# Patient Record
Sex: Female | Born: 1950
Health system: Southern US, Community
[De-identification: ages and names within clinical notes are randomized; demographics above are authoritative.]

## PROBLEM LIST (undated history)

## (undated) ENCOUNTER — Emergency Department (HOSPITAL_COMMUNITY): Admission: EM | Payer: Medicare HMO | Source: Home / Self Care

## (undated) ENCOUNTER — Emergency Department (HOSPITAL_COMMUNITY): Payer: BC Managed Care – PPO | Source: Home / Self Care

## (undated) DIAGNOSIS — M109 Gout, unspecified: Secondary | ICD-10-CM

## (undated) DIAGNOSIS — B1920 Unspecified viral hepatitis C without hepatic coma: Secondary | ICD-10-CM

## (undated) DIAGNOSIS — I499 Cardiac arrhythmia, unspecified: Secondary | ICD-10-CM

## (undated) DIAGNOSIS — I10 Essential (primary) hypertension: Secondary | ICD-10-CM

## (undated) DIAGNOSIS — N189 Chronic kidney disease, unspecified: Secondary | ICD-10-CM

## (undated) DIAGNOSIS — N186 End stage renal disease: Secondary | ICD-10-CM

## (undated) DIAGNOSIS — R7881 Bacteremia: Secondary | ICD-10-CM

## (undated) DIAGNOSIS — J45909 Unspecified asthma, uncomplicated: Secondary | ICD-10-CM

## (undated) DIAGNOSIS — I4510 Unspecified right bundle-branch block: Secondary | ICD-10-CM

## (undated) DIAGNOSIS — D649 Anemia, unspecified: Secondary | ICD-10-CM

## (undated) DIAGNOSIS — I480 Paroxysmal atrial fibrillation: Secondary | ICD-10-CM

## (undated) HISTORY — DX: Chronic kidney disease, unspecified: N18.9

## (undated) NOTE — *Deleted (*Deleted)
[facility] CONSULT NOTE  Patient Care Team: [name], MD as PCP - General (Internal Medicine)  CHIEF COMPLAINTS/PURPOSE OF CONSULTATION:  Newly diagnosed anemia  HISTORY OF PRESENTING ILLNESS:  Diamond [name] 38 y.o. female is here because of recent diagnosis of anemia. She is referred by Dr. Marylen Ponto. Labs on 08/29/20 showed: Hg 9.2, HCT 27.7, platelets 124, iron saturation 37%, TIBC 239, ferritin 123XX123, 0000000 123XX123, folic acid 123456. She presents to the clinic today for initial evaluation.   I reviewed her records extensively and collaborated the history with the patient.  MEDICAL HISTORY:  Past Medical History:  Diagnosis Date  . Asthma   . Chronic kidney disease   . Gout   . Hypertension     SURGICAL HISTORY: Past Surgical History:  Procedure Laterality Date  . CESAREAN SECTION  1989    SOCIAL HISTORY: Social History   Socioeconomic History  . Marital status: Widowed    Spouse name: Not on file  . Number of children: Not on file  . Years of education: Not on file  . Highest education level: Not on file  Occupational History  . Not on file  Tobacco Use  . Smoking status: Never Smoker  . Smokeless tobacco: Never Used  Vaping Use  . Vaping Use: Never used  Substance and Sexual Activity  . Alcohol use: No  . Drug use: No  . Sexual activity: Not on file  Other Topics Concern  . Not on file  Social History Narrative  . Not on file   Social Determinants of Health   Financial Resource Strain:   . Difficulty of Paying Living Expenses: Not on file  Food Insecurity:   . Worried About Charity fundraiser in the Last Year: Not on file  . Ran Out of Food in the Last Year: Not on file  Transportation Needs:   . Lack of Transportation (Medical): Not on file  . Lack of Transportation (Non-Medical): Not on file  Physical Activity:   . Days of Exercise per Week: Not on file  . Minutes of Exercise per Session: Not on file  Stress:   . Feeling  of Stress : Not on file  Social Connections:   . Frequency of Communication with Friends and Family: Not on file  . Frequency of Social Gatherings with Friends and Family: Not on file  . Attends Religious Services: Not on file  . Active Member of Clubs or Organizations: Not on file  . Attends Archivist Meetings: Not on file  . Marital Status: Not on file  Intimate Partner Violence:   . Fear of Current or Ex-Partner: Not on file  . Emotionally Abused: Not on file  . Physically Abused: Not on file  . Sexually Abused: Not on file    FAMILY HISTORY: Family History  Problem Relation Age of Onset  . Hypertension Mother   . Hypertension Father   . Colon cancer Brother   . Lung cancer Brother     ALLERGIES:  has No Known Allergies.  MEDICATIONS:  Current Outpatient Medications  Medication Sig Dispense Refill  . albuterol (VENTOLIN HFA) 108 (90 Base) MCG/ACT inhaler Inhale 2 puffs into the lungs every 4 (four) hours as needed for wheezing or shortness of breath.    Marland Kitchen apixaban (ELIQUIS) 2.5 MG TABS tablet Take 1 tablet (2.5 mg total) by mouth 2 (two) times daily. 60 tablet 0  . atenolol (TENORMIN) 25 MG tablet Take 25 mg by mouth daily.    Marland Kitchen  budesonide-formoterol (SYMBICORT) 160-4.5 MCG/ACT inhaler Inhale 2 puffs into the lungs 2 (two) times daily.    . cyclobenzaprine (FLEXERIL) 5 MG tablet Take 1 tablet (5 mg total) by mouth 2 (two) times daily as needed for muscle spasms. 30 tablet 0  . Fluticasone-Salmeterol (ADVAIR DISKUS) 250-50 MCG/DOSE AEPB Inhale 1 puff into the lungs 2 (two) times daily.     . montelukast (SINGULAIR) 10 MG tablet Take 10 mg by mouth at bedtime.    . traMADol (ULTRAM) 50 MG tablet Take 1-2 tablets (50-100 mg total) by mouth every 12 (twelve) hours as needed for up to 10 doses for moderate pain or severe pain. 12 tablet 0  . verapamil (CALAN) 120 MG tablet Take 120 mg by mouth 3 (three) times daily.     No current facility-administered medications for  this visit.    REVIEW OF SYSTEMS:   Constitutional: Denies fevers, chills or abnormal night sweats Eyes: Denies blurriness of vision, double vision or watery eyes Ears, nose, mouth, throat, and face: Denies mucositis or sore throat Respiratory: Denies cough, dyspnea or wheezes Cardiovascular: Denies palpitation, chest discomfort or lower extremity swelling Gastrointestinal:  Denies nausea, heartburn or change in bowel habits Skin: Denies abnormal skin rashes Lymphatics: Denies new lymphadenopathy or easy bruising Neurological:Denies numbness, tingling or new weaknesses Behavioral/Psych: Mood is stable, no new changes  Breast: Denies any palpable lumps or discharge All other systems were reviewed with the patient and are negative.  PHYSICAL EXAMINATION: ECOG PERFORMANCE STATUS: {CHL ONC ECOG [phone]}  There were no vitals filed for this visit. There were no vitals filed for this visit.  GENERAL:alert, no distress and comfortable SKIN: skin color, texture, turgor are normal, no rashes or significant lesions EYES: normal, conjunctiva are pink and non-injected, sclera clear OROPHARYNX:no exudate, no erythema and lips, buccal mucosa, and tongue normal  NECK: supple, thyroid normal size, non-tender, without nodularity LYMPH:  no palpable lymphadenopathy in the cervical, axillary or inguinal LUNGS: clear to auscultation and percussion with normal breathing effort HEART: regular rate & rhythm and no murmurs and no lower extremity edema ABDOMEN:abdomen soft, non-tender and normal bowel sounds Musculoskeletal:no cyanosis of digits and no clubbing  PSYCH: alert & oriented x 3 with fluent speech NEURO: no focal motor/sensory deficits  LABORATORY DATA:  I have reviewed the data as listed Lab Results  Component Value Date   WBC 6.2 11/12/2019   HGB 8.9 (L) 11/12/2019   HCT 28.7 (L) 11/12/2019   MCV 102.1 (H) 11/12/2019   PLT 364 11/12/2019   Lab Results  Component Value Date    NA 140 11/12/2019   K 4.4 11/12/2019   CL 108 11/12/2019   CO2 22 11/12/2019    RADIOGRAPHIC STUDIES: I have personally reviewed the radiological reports and agreed with the findings in the report.  ASSESSMENT AND PLAN:  No problem-specific Assessment & Plan notes found for this encounter.   All questions were answered. The patient knows to call the clinic with any problems, questions or concerns.   Rulon Eisenmenger, MD, MPH 09/22/2020    I, Molly Dorshimer, am acting as scribe for Nicholas Lose, MD.  {Add scribe attestation statement}

---

## 2001-07-10 ENCOUNTER — Encounter: Payer: Self-pay | Admitting: Emergency Medicine

## 2001-07-10 ENCOUNTER — Encounter: Admission: RE | Admit: 2001-07-10 | Discharge: 2001-07-10 | Payer: Self-pay | Admitting: Emergency Medicine

## 2001-10-06 ENCOUNTER — Emergency Department (HOSPITAL_COMMUNITY): Admission: EM | Admit: 2001-10-06 | Discharge: 2001-10-06 | Payer: Self-pay | Admitting: Emergency Medicine

## 2003-08-27 ENCOUNTER — Emergency Department (HOSPITAL_COMMUNITY): Admission: EM | Admit: 2003-08-27 | Discharge: 2003-08-27 | Payer: Self-pay | Admitting: Emergency Medicine

## 2006-03-06 ENCOUNTER — Emergency Department (HOSPITAL_COMMUNITY): Admission: EM | Admit: 2006-03-06 | Discharge: 2006-03-06 | Payer: Self-pay | Admitting: Family Medicine

## 2008-02-25 ENCOUNTER — Emergency Department (HOSPITAL_COMMUNITY): Admission: EM | Admit: 2008-02-25 | Discharge: 2008-02-25 | Payer: Self-pay | Admitting: Family Medicine

## 2010-10-15 ENCOUNTER — Observation Stay (HOSPITAL_COMMUNITY): Admission: EM | Admit: 2010-10-15 | Discharge: 2010-10-15 | Payer: Self-pay | Admitting: Emergency Medicine

## 2010-10-22 ENCOUNTER — Emergency Department (HOSPITAL_COMMUNITY): Admission: EM | Admit: 2010-10-22 | Discharge: 2010-10-22 | Payer: Self-pay | Admitting: Family Medicine

## 2011-02-16 LAB — BASIC METABOLIC PANEL
BUN: 15 mg/dL (ref 6–23)
BUN: 16 mg/dL (ref 6–23)
CO2: 29 mEq/L (ref 19–32)
Calcium: 10 mg/dL (ref 8.4–10.5)
Calcium: 10 mg/dL (ref 8.4–10.5)
Chloride: 100 mEq/L (ref 96–112)
Creatinine, Ser: 0.9 mg/dL (ref 0.4–1.2)
Creatinine, Ser: 1.21 mg/dL — ABNORMAL HIGH (ref 0.4–1.2)
GFR calc Af Amer: >60 mL/min (ref >60)
GFR calc non Af Amer: 45 mL/min — ABNORMAL LOW (ref >60)
GFR calc non Af Amer: >60 mL/min (ref >60)
Glucose, Bld: 122 mg/dL — ABNORMAL HIGH (ref 70–99)
Glucose, Bld: 162 mg/dL — ABNORMAL HIGH (ref 70–99)
Potassium: 2.6 mEq/L — CL (ref 3.5–5.1)
Potassium: 2.9 mEq/L — ABNORMAL LOW (ref 3.5–5.1)
Sodium: 143 mEq/L (ref 135–145)

## 2011-02-16 LAB — POCT I-STAT 3, ART BLOOD GAS (G3+)
Acid-base deficit: 1 mmol/L (ref 0.0–2.0)
Bicarbonate: 24.8 mEq/L — ABNORMAL HIGH (ref 20.0–24.0)
TCO2: 26 mmol/L (ref 0–100)
pH, Arterial: 7.347 — ABNORMAL LOW (ref 7.350–7.400)

## 2011-02-16 LAB — CBC
HCT: 37.5 % (ref 36.0–46.0)
MCH: 33.2 pg (ref 26.0–34.0)
MCV: 96.4 fL (ref 78.0–100.0)
Platelets: 118 10*3/uL — ABNORMAL LOW (ref 150–400)
RBC: 3.89 MIL/uL (ref 3.87–5.11)
RDW: 13.5 % (ref 11.5–15.5)

## 2011-02-16 LAB — DIFFERENTIAL
Eosinophils Absolute: 0.2 10*3/uL (ref 0.0–0.7)
Eosinophils Relative: 3 % (ref 0–5)
Lymphs Abs: 2.6 10*3/uL (ref 0.7–4.0)
Monocytes Absolute: 0.4 10*3/uL (ref 0.1–1.0)

## 2011-02-16 LAB — POCT I-STAT, CHEM 8
BUN: 18 mg/dL (ref 6–23)
Calcium, Ion: 1.08 mmol/L — ABNORMAL LOW (ref 1.12–1.32)
Calcium, Ion: 1.14 mmol/L (ref 1.12–1.32)
Creatinine, Ser: 1.1 mg/dL (ref 0.4–1.2)
Glucose, Bld: 297 mg/dL — ABNORMAL HIGH (ref 70–99)
HCT: 45 % (ref 36.0–46.0)
HCT: 47 % — ABNORMAL HIGH (ref 36.0–46.0)
Hemoglobin: 15.3 g/dL — ABNORMAL HIGH (ref 12.0–15.0)
Hemoglobin: 16 g/dL — ABNORMAL HIGH (ref 12.0–15.0)
Potassium: 2.7 mEq/L — CL (ref 3.5–5.1)
Sodium: 136 mEq/L (ref 135–145)
TCO2: 25 mmol/L (ref 0–100)
TCO2: 30 mmol/L (ref 0–100)

## 2011-02-22 ENCOUNTER — Other Ambulatory Visit (HOSPITAL_COMMUNITY): Payer: Self-pay | Admitting: Family Medicine

## 2011-02-22 DIAGNOSIS — Z1231 Encounter for screening mammogram for malignant neoplasm of breast: Secondary | ICD-10-CM

## 2011-02-23 ENCOUNTER — Encounter (INDEPENDENT_AMBULATORY_CARE_PROVIDER_SITE_OTHER): Payer: Self-pay | Admitting: *Deleted

## 2011-02-23 LAB — CONVERTED CEMR LAB
AST: 49 units/L — ABNORMAL HIGH (ref 0–37)
Albumin: 3.9 g/dL (ref 3.5–5.2)
Alkaline Phosphatase: 53 units/L (ref 39–117)
Basophils Absolute: 0 10*3/uL (ref 0.0–0.1)
Basophils Relative: 0 % (ref 0–1)
Eosinophils Absolute: 0.1 10*3/uL (ref 0.0–0.7)
Glucose, Bld: 90 mg/dL (ref 70–99)
Hemoglobin: 12.6 g/dL (ref 12.0–15.0)
LDL Cholesterol: 80 mg/dL (ref 0–99)
MCHC: 33.2 g/dL (ref 30.0–36.0)
MCV: 96 fL (ref 78.0–100.0)
Monocytes Absolute: 0.4 10*3/uL (ref 0.1–1.0)
Monocytes Relative: 10 % (ref 3–12)
Neutrophils Relative %: 41 % — ABNORMAL LOW (ref 43–77)
Potassium: 3.6 meq/L (ref 3.5–5.3)
RBC: 3.96 M/uL (ref 3.87–5.11)
RDW: 14.2 % (ref 11.5–15.5)
Sodium: 142 meq/L (ref 135–145)
TSH: 3.177 microintl units/mL (ref 0.350–4.500)
Total Bilirubin: 0.7 mg/dL (ref 0.3–1.2)
Total Protein: 7.4 g/dL (ref 6.0–8.3)

## 2011-03-02 ENCOUNTER — Ambulatory Visit (HOSPITAL_COMMUNITY)
Admission: RE | Admit: 2011-03-02 | Discharge: 2011-03-02 | Disposition: A | Discharge status: Home / Self Care | Payer: Self-pay | Source: Ambulatory Visit | Attending: Family Medicine | Admitting: Family Medicine

## 2011-03-02 DIAGNOSIS — Z1231 Encounter for screening mammogram for malignant neoplasm of breast: Secondary | ICD-10-CM | POA: Insufficient documentation

## 2011-03-05 ENCOUNTER — Other Ambulatory Visit: Payer: Self-pay | Admitting: Family Medicine

## 2011-03-05 DIAGNOSIS — R928 Other abnormal and inconclusive findings on diagnostic imaging of breast: Secondary | ICD-10-CM

## 2011-03-10 ENCOUNTER — Ambulatory Visit
Admission: RE | Admit: 2011-03-10 | Discharge: 2011-03-10 | Disposition: A | Discharge status: Home / Self Care | Payer: Self-pay | Source: Ambulatory Visit | Attending: Family Medicine | Admitting: Family Medicine

## 2011-03-10 DIAGNOSIS — R928 Other abnormal and inconclusive findings on diagnostic imaging of breast: Secondary | ICD-10-CM

## 2011-08-29 ENCOUNTER — Emergency Department (HOSPITAL_COMMUNITY)
Admission: EM | Admit: 2011-08-29 | Discharge: 2011-08-29 | Disposition: A | Discharge status: Home / Self Care | Payer: Self-pay | Attending: Emergency Medicine | Admitting: Emergency Medicine

## 2011-08-29 DIAGNOSIS — M79609 Pain in unspecified limb: Secondary | ICD-10-CM | POA: Insufficient documentation

## 2011-08-29 DIAGNOSIS — I10 Essential (primary) hypertension: Secondary | ICD-10-CM | POA: Insufficient documentation

## 2011-08-29 DIAGNOSIS — Z79899 Other long term (current) drug therapy: Secondary | ICD-10-CM | POA: Insufficient documentation

## 2011-08-29 DIAGNOSIS — M722 Plantar fascial fibromatosis: Secondary | ICD-10-CM | POA: Insufficient documentation

## 2013-02-17 ENCOUNTER — Encounter (HOSPITAL_COMMUNITY): Payer: Self-pay | Admitting: Emergency Medicine

## 2013-02-17 ENCOUNTER — Emergency Department (HOSPITAL_COMMUNITY)
Admission: EM | Admit: 2013-02-17 | Discharge: 2013-02-17 | Disposition: A | Discharge status: Home / Self Care | Payer: BC Managed Care – PPO | Source: Home / Self Care | Attending: Emergency Medicine | Admitting: Emergency Medicine

## 2013-02-17 DIAGNOSIS — I10 Essential (primary) hypertension: Secondary | ICD-10-CM

## 2013-02-17 HISTORY — DX: Unspecified asthma, uncomplicated: J45.909

## 2013-02-17 HISTORY — DX: Essential (primary) hypertension: I10

## 2013-02-17 MED ORDER — VERAPAMIL HCL 120 MG PO TABS: 120.0000 mg | ORAL_TABLET | Freq: Three times a day (TID) | ORAL | Status: DC

## 2013-02-17 NOTE — ED Provider Notes (Signed)
Medical screening examination/treatment/procedure(s) were performed by non-physician practitioner and as supervising physician I was immediately available for consultation/collaboration.  Philipp Deputy, M.D.  Harden Mo, MD 02/17/13 2031

## 2013-02-17 NOTE — ED Notes (Signed)
Pt is here to have her BP medication refilled Went to a walk in clinic but they were not taking any more pt's  Denies any medical problems: SOB, chest pain, blurry vision, headache, edema  She is alert and oriented w/no signs of acute disterss.

## 2013-02-17 NOTE — ED Provider Notes (Signed)
History     CSN: [id number]  Arrival date & time 02/17/13  1103   First MD Initiated Contact with Patient 02/17/13 1115      Chief Complaint  Patient presents with  . Medication Refill    HPI: The history is provided by the patient.  Pt presents requesting refill for her Verapamil. States she is a pt at [facility] on [address]. Dr Jimmye Norman who usually sees her for her physicals and med refills has been unable to work her in this week. She has presented x 2 to his clinic this week and was told she had come too late. She was going to run out after todays dose. Pt does plan to call and schedule a visit with Dr Jimmye Norman this week. Denies any c/o's.   Past Medical History  Diagnosis Date  . Hypertension   . Asthma     History reviewed. No pertinent past surgical history.  No family history on file.  History  Substance Use Topics  . Smoking status: Never Smoker   . Smokeless tobacco: Not on file  . Alcohol Use: No    OB History   Grav Para Term Preterm Abortions TAB SAB Ect Mult Living                  Review of Systems  Constitutional: Negative.   HENT: Negative.   Eyes: Negative.   Respiratory: Negative for chest tightness and shortness of breath.   Cardiovascular: Negative for chest pain and palpitations.  Gastrointestinal: Negative for nausea, vomiting, abdominal pain and diarrhea.  Endocrine: Negative.   Genitourinary: Negative.   Musculoskeletal: Negative.   Skin: Negative.   Allergic/Immunologic: Negative.   Neurological: Negative.   Hematological: Negative.   Psychiatric/Behavioral: Negative.     Allergies  Review of patient's allergies indicates no known allergies.  Home Medications   Current Outpatient Rx  Name  Route  Sig  Dispense  Refill  . atenolol (TENORMIN) 25 MG tablet   Oral   Take 25 mg by mouth daily.         . verapamil (CALAN) 120 MG tablet   Oral   Take 120 mg by mouth 3 (three) times daily.         .  Fluticasone-Salmeterol (ADVAIR DISKUS IN)   Inhalation   Inhale into the lungs.         . hydrochlorothiazide (HYDRODIURIL) 25 MG tablet   Oral   Take 25 mg by mouth daily.           Pulse 65  Temp(Src) 97.9 F (36.6 C) (Oral)  Resp 18  SpO2 96%  Physical Exam  Nursing note and vitals reviewed. Constitutional: She is oriented to person, place, and time. She appears well-developed and well-nourished.  HENT:  Head: Normocephalic and atraumatic.  Eyes: Conjunctivae are normal.  Neck: Neck supple.  Cardiovascular: Normal rate and regular rhythm.   Pulmonary/Chest: Effort normal and breath sounds normal.  Musculoskeletal: Normal range of motion.  Neurological: She is alert and oriented to person, place, and time.  Skin: Skin is warm and dry.  Psychiatric: She has a normal mood and affect.    ED Course  Procedures  Labs Reviewed - No data to display No results found.   No diagnosis found.    MDM  Request refill for Verapamil. No c/o's. Has attempted x 2 this week to get in to see PCP w/o success. BP today 143/93. Denies c/o. Agreed to refill Vrapamil for  1 month to allow time for pt to arrange f/u w/ PCP as planned.         Jeryl Columbia, NP 02/17/13 [place], NP 02/17/13 1159

## 2015-09-06 ENCOUNTER — Emergency Department (INDEPENDENT_AMBULATORY_CARE_PROVIDER_SITE_OTHER): Payer: Medicare HMO

## 2015-09-06 ENCOUNTER — Encounter (HOSPITAL_COMMUNITY): Payer: Self-pay | Admitting: *Deleted

## 2015-09-06 ENCOUNTER — Emergency Department (HOSPITAL_COMMUNITY)
Admission: EM | Admit: 2015-09-06 | Discharge: 2015-09-06 | Disposition: A | Discharge status: Home / Self Care | Payer: Medicare HMO | Source: Home / Self Care | Attending: Family Medicine | Admitting: Family Medicine

## 2015-09-06 DIAGNOSIS — M10071 Idiopathic gout, right ankle and foot: Secondary | ICD-10-CM

## 2015-09-06 MED ORDER — COLCHICINE 0.6 MG PO TABS: 0.6000 mg | ORAL_TABLET | Freq: Two times a day (BID) | ORAL | Status: DC

## 2015-09-06 NOTE — ED Notes (Signed)
Pt  Reports  Pain  r  Foot    -    Pt  denys  Any  inujury         Pt  States    Symptoms  X  2  Weeks      Ago       -  Pt  denys  Any specefic  Injury

## 2015-09-06 NOTE — Discharge Instructions (Signed)
Use medicine as prescribed until pain resolves, see your doctor for further eval if further problems and for refill of medicine if needed.

## 2015-09-06 NOTE — ED Provider Notes (Signed)
CSN: [id number]     Arrival date & time 09/06/15  1316 History   None    Chief Complaint  Patient presents with  . Foot Pain   (Consider location/radiation/quality/duration/timing/severity/associated sxs/prior Treatment) Patient is a 64 y.o. female presenting with lower extremity pain. The history is provided by the patient.  Foot Pain This is a new problem. The current episode started more than 1 week ago (2wks ago stubbed toe, improving pain adn sts, poss gout.). The problem has been gradually improving.    Past Medical History  Diagnosis Date  . Hypertension   . Asthma    History reviewed. No pertinent past surgical history. History reviewed. No pertinent family history. Social History  Substance Use Topics  . Smoking status: Never Smoker   . Smokeless tobacco: None  . Alcohol Use: No   OB History    No data available     Review of Systems  Musculoskeletal: Positive for joint swelling and gait problem. Negative for myalgias.  Skin: Negative.   All other systems reviewed and are negative.   Allergies  Review of patient's allergies indicates no known allergies.  Home Medications   Prior to Admission medications   Medication Sig Start Date End Date Taking? Authorizing Provider  atenolol (TENORMIN) 25 MG tablet Take 25 mg by mouth daily.    Historical Provider, MD  colchicine 0.6 MG tablet Take 1 tablet (0.6 mg total) by mouth 2 (two) times daily. For gout 09/06/15   Billy Fischer, MD  Fluticasone-Salmeterol (ADVAIR DISKUS IN) Inhale into the lungs.    Historical Provider, MD  hydrochlorothiazide (HYDRODIURIL) 25 MG tablet Take 25 mg by mouth daily.    Historical Provider, MD  verapamil (CALAN) 120 MG tablet Take 120 mg by mouth 3 (three) times daily.    Historical Provider, MD  verapamil (CALAN) 120 MG tablet Take 1 tablet (120 mg total) by mouth 3 (three) times daily. 02/17/13   Jeryl Columbia, NP   Meds Ordered and Administered this Visit  Medications - No data  to display  BP 133/89 mmHg  Pulse 73  Temp(Src) 98.8 F (37.1 C) (Oral)  Resp 18  SpO2 98% No data found.   Physical Exam  Constitutional: She is oriented to person, place, and time. She appears well-developed and well-nourished.  Musculoskeletal: She exhibits edema and tenderness.       Feet:  Neurological: She is alert and oriented to person, place, and time.  Skin: Skin is warm and dry. No erythema.  Nursing note and vitals reviewed.   ED Course  Procedures (including critical care time)  Labs Review Labs Reviewed - No data to display  Imaging Review Dg Toe Great Right  09/06/2015   CLINICAL DATA:  Trauma 2 weeks ago, persistent pain and swelling right great toe.  EXAM: RIGHT GREAT TOE  COMPARISON:  None.  FINDINGS: Soft tissue swelling about the right first MTP joint. Degenerative changes of the right first MTP joint with joint space loss, sclerosis and bony spurring. No malalignment. Surrounding periarticular soft tissue calcifications medially suggesting a component of gout. No acute osseous finding or fracture.  IMPRESSION: Soft tissue swelling.  Right first MTP joint degenerative change and periarticular soft tissue swelling with calcification, suggesting gout arthropathy  No acute osseous finding   Electronically Signed   By: Jerilynn Mages.  Shick M.D.   On: 09/06/2015 14:17    X-rays reviewed and report per radiologist.  Visual Acuity Review  Right Eye Distance:   Left  Eye Distance:   Bilateral Distance:    Right Eye Near:   Left Eye Near:    Bilateral Near:         MDM   1. Acute idiopathic gout of right foot    rx for colchicine for presumtive gout.    Billy Fischer, MD 09/06/15 [phone]

## 2016-03-04 ENCOUNTER — Emergency Department (HOSPITAL_COMMUNITY)
Admission: EM | Admit: 2016-03-04 | Discharge: 2016-03-04 | Disposition: A | Discharge status: Home / Self Care | Payer: Medicare HMO | Source: Home / Self Care | Attending: Family Medicine | Admitting: Family Medicine

## 2016-03-04 ENCOUNTER — Encounter (HOSPITAL_COMMUNITY): Payer: Self-pay | Admitting: Emergency Medicine

## 2016-03-04 DIAGNOSIS — M109 Gout, unspecified: Secondary | ICD-10-CM

## 2016-03-04 DIAGNOSIS — M1000 Idiopathic gout, unspecified site: Secondary | ICD-10-CM

## 2016-03-04 DIAGNOSIS — M79672 Pain in left foot: Secondary | ICD-10-CM

## 2016-03-04 HISTORY — DX: Gout, unspecified: M10.9

## 2016-03-04 LAB — POCT I-STAT, CHEM 8
BUN: 30 mg/dL — AB (ref 6–20)
CALCIUM ION: 1.2 mmol/L (ref 1.13–1.30)
CREATININE: 1.5 mg/dL — AB (ref 0.44–1.00)
Chloride: 100 mmol/L — ABNORMAL LOW (ref 101–111)
Glucose, Bld: 110 mg/dL — ABNORMAL HIGH (ref 65–99)
HCT: 38 % (ref 36.0–46.0)
Hemoglobin: 12.9 g/dL (ref 12.0–15.0)
Potassium: 3 mmol/L — ABNORMAL LOW (ref 3.5–5.1)
SODIUM: 141 mmol/L (ref 135–145)
TCO2: 29 mmol/L (ref 0–100)

## 2016-03-04 MED ORDER — KETOROLAC TROMETHAMINE 30 MG/ML IJ SOLN
30.0000 mg | Freq: Once | INTRAMUSCULAR | Status: AC
  Administered 2016-03-04: 30 mg via INTRAMUSCULAR

## 2016-03-04 MED ORDER — INDOMETHACIN 25 MG PO CAPS: 25.0000 mg | ORAL_CAPSULE | Freq: Three times a day (TID) | ORAL | Status: DC

## 2016-03-04 MED ORDER — KETOROLAC TROMETHAMINE 30 MG/ML IJ SOLN
INTRAMUSCULAR | Status: AC
  Filled 2016-03-04: qty 1

## 2016-03-04 NOTE — ED Provider Notes (Addendum)
CSN: [id number]     Arrival date & time 03/04/16  1324 History   First MD Initiated Contact with Patient 03/04/16 1436     Chief Complaint  Patient presents with  . Toe Pain   (Consider location/radiation/quality/duration/timing/severity/associated sxs/prior Treatment) Patient is a 65 y.o. female presenting with toe pain. The history is provided by the patient. No language interpreter was used.  Toe Pain Pertinent negatives include no chest pain and no abdominal pain.   Patient presents with complaint of L great toe pain and swelling, began on Saturday March 25th when she woke up.  Hot and red, painful to walk. No trauma, no change in activity.  Worsened on Sun/Mon.  In the past she had a similar episode of pain/swelling in the R great toe, diagnosed then with gout and given Rx for colchicine 0.6mg . She took some colchicine on this episode of pain, says it did not help but did give her diarrhea.  She has taken Motrin 200mg  tablets, 1 tablet a few times a day without relief.   Is able to ambulate, favors L foot.   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  Past Medical History  Diagnosis Date  . Hypertension   . Asthma   . Gout    History reviewed. No pertinent past surgical history. No family history on file. Social History  Substance Use Topics  . Smoking status: Never Smoker   . Smokeless tobacco: None  . Alcohol Use: No   OB History    No data available     Review of Systems  Constitutional: Negative for fever, chills, diaphoresis, fatigue and unexpected weight change.  Respiratory: Negative for chest tightness and wheezing.   Cardiovascular: Negative for chest pain.  Gastrointestinal: Negative for abdominal pain.  All other systems reviewed and are negative.   Allergies  Review of patient's allergies indicates no known allergies.  Home Medications   Prior to Admission medications   Medication Sig Start  Date End Date Taking? Authorizing Provider  atenolol (TENORMIN) 25 MG tablet Take 25 mg by mouth daily.   Yes Historical Provider, MD  colchicine 0.6 MG tablet Take 1 tablet (0.6 mg total) by mouth 2 (two) times daily. For gout 09/06/15  Yes Billy Fischer, MD  hydrochlorothiazide (HYDRODIURIL) 25 MG tablet Take 25 mg by mouth daily.   Yes Historical Provider, MD  verapamil (CALAN) 120 MG tablet Take 120 mg by mouth 3 (three) times daily.   Yes Historical Provider, MD  Fluticasone-Salmeterol (ADVAIR DISKUS IN) Inhale into the lungs.    Historical Provider, MD  verapamil (CALAN) 120 MG tablet Take 1 tablet (120 mg total) by mouth 3 (three) times daily. 02/17/13   Jeryl Columbia, NP   Meds Ordered and Administered this Visit  Medications - No data to display  BP 124/78 mmHg  Pulse 70  Temp(Src) 98.6 F (37 C) (Oral)  Resp 16  SpO2 99% No data found.   Physical Exam  Constitutional: She appears well-developed and well-nourished. No distress.  Neck: Neck supple.  Musculoskeletal:  Bilateral dp pulses palpable.   L MTP joint red, hot and tender.  Brisk cap refill in distal L great toe (<2 seconds). Sensation grossly intact.  No swelling or tenderness along ankle; full active ROM ankle with dorsi/plantarflexion.   Skin: She is not diaphoretic.    ED Course  Procedures (including critical care time)  Labs Review Labs Reviewed - No  data to display  Imaging Review No results found.   Visual Acuity Review  Right Eye Distance:   Left Eye Distance:   Bilateral Distance:    Right Eye Near:   Left Eye Near:    Bilateral Near:         MDM   1. Podagra   2. Left foot pain    Podagra L great toe.  Patient denies history of renal disease or any drug allergies. Prior iStat from several years ago with mildly impaired renal function. To recheck iStat before considering ketorolac, Cr 1.5, reduced dose Toradol 30mg  IM x 1. Indomethacin at home starting tomorrow. Primary doctor  Dr York Ram on [facility]) for follow up.   Dalbert Mayotte, MD    Willeen Niece, MD 03/04/16 [place], MD 03/04/16 [phone]

## 2016-03-04 NOTE — Discharge Instructions (Signed)
It is a pleasure to see you today.  I believe you have a repeat case of gout.   You are being given an injectable medicine Ketorolac 30mg , intramuscularly, for pain.   Indomethacin 25mg  capsules, take 1 capsule by mouth every 8 hours with something to eat. Start this medicine tomorrow.   Plenty of fluids.  Do not take Motrin (ibuprofen) along with the prescribed medicine.  You may take Tylenol if needed, in addition to the Indomethacin.   Follow up with Dr York Ram at [facility] in the coming week.    Gout Gout is an inflammatory arthritis caused by a buildup of uric acid crystals in the joints. Uric acid is a chemical that is normally present in the blood. When the level of uric acid in the blood is too high it can form crystals that deposit in your joints and tissues. This causes joint redness, soreness, and swelling (inflammation). Repeat attacks are common. Over time, uric acid crystals can form into masses (tophi) near a joint, destroying bone and causing disfigurement. Gout is treatable and often preventable. CAUSES  The disease begins with elevated levels of uric acid in the blood. Uric acid is produced by your body when it breaks down a naturally found substance called purines. Certain foods you eat, such as meats and fish, contain high amounts of purines. Causes of an elevated uric acid level include:  Being passed down from parent to child (heredity).  Diseases that cause increased uric acid production (such as obesity, psoriasis, and certain cancers).  Excessive alcohol use.  Diet, especially diets rich in meat and seafood.  Medicines, including certain cancer-fighting medicines (chemotherapy), water pills (diuretics), and aspirin.  Chronic kidney disease. The kidneys are no longer able to remove uric acid well.  Problems with metabolism. Conditions strongly associated with gout include:  Obesity.  High blood pressure.  High  cholesterol.  Diabetes. Not everyone with elevated uric acid levels gets gout. It is not understood why some people get gout and others do not. Surgery, joint injury, and eating too much of certain foods are some of the factors that can lead to gout attacks. SYMPTOMS   An attack of gout comes on quickly. It causes intense pain with redness, swelling, and warmth in a joint.  Fever can occur.  Often, only one joint is involved. Certain joints are more commonly involved:  Base of the big toe.  Knee.  Ankle.  Wrist.  Finger. Without treatment, an attack usually goes away in a few days to weeks. Between attacks, you usually will not have symptoms, which is different from many other forms of arthritis. DIAGNOSIS  Your caregiver will suspect gout based on your symptoms and exam. In some cases, tests may be recommended. The tests may include:  Blood tests.  Urine tests.  X-rays.  Joint fluid exam. This exam requires a needle to remove fluid from the joint (arthrocentesis). Using a microscope, gout is confirmed when uric acid crystals are seen in the joint fluid. TREATMENT  There are two phases to gout treatment: treating the sudden onset (acute) attack and preventing attacks (prophylaxis).  Treatment of an Acute Attack.  Medicines are used. These include anti-inflammatory medicines or steroid medicines.  An injection of steroid medicine into the affected joint is sometimes necessary.  The painful joint is rested. Movement can worsen the arthritis.  You may use warm or cold treatments on painful joints, depending which works best for you.  Treatment to Prevent Attacks.  If you suffer from frequent gout attacks, your caregiver may advise preventive medicine. These medicines are started after the acute attack subsides. These medicines either help your kidneys eliminate uric acid from your body or decrease your uric acid production. You may need to stay on these medicines for a  very long time.  The early phase of treatment with preventive medicine can be associated with an increase in acute gout attacks. For this reason, during the first few months of treatment, your caregiver may also advise you to take medicines usually used for acute gout treatment. Be sure you understand your caregiver's directions. Your caregiver may make several adjustments to your medicine dose before these medicines are effective.  Discuss dietary treatment with your caregiver or dietitian. Alcohol and drinks high in sugar and fructose and foods such as meat, poultry, and seafood can increase uric acid levels. Your caregiver or dietitian can advise you on drinks and foods that should be limited. HOME CARE INSTRUCTIONS   Do not take aspirin to relieve pain. This raises uric acid levels.  Only take over-the-counter or prescription medicines for pain, discomfort, or fever as directed by your caregiver.  Rest the joint as much as possible. When in bed, keep sheets and blankets off painful areas.  Keep the affected joint raised (elevated).  Apply warm or cold treatments to painful joints. Use of warm or cold treatments depends on which works best for you.  Use crutches if the painful joint is in your leg.  Drink enough fluids to keep your urine clear or pale yellow. This helps your body get rid of uric acid. Limit alcohol, sugary drinks, and fructose drinks.  Follow your dietary instructions. Pay careful attention to the amount of protein you eat. Your daily diet should emphasize fruits, vegetables, whole grains, and fat-free or low-fat milk products. Discuss the use of coffee, vitamin C, and cherries with your caregiver or dietitian. These may be helpful in lowering uric acid levels.  Maintain a healthy body weight. SEEK MEDICAL CARE IF:   You develop diarrhea, vomiting, or any side effects from medicines.  You do not feel better in 24 hours, or you are getting worse. SEEK IMMEDIATE MEDICAL  CARE IF:   Your joint becomes suddenly more tender, and you have chills or a fever. MAKE SURE YOU:   Understand these instructions.  Will watch your condition.  Will get help right away if you are not doing well or get worse.   This information is not intended to replace advice given to you by your health care provider. Make sure you discuss any questions you have with your health care provider.   Document Released: 11/19/2000 Document Revised: 12/13/2014 Document Reviewed: 07/05/2012 Elsevier Interactive Patient Education [employer].

## 2016-03-04 NOTE — ED Notes (Signed)
PT reports left toe pain that started Friday night. PT reports pain has waxed and waned a bit. PT reports yesterday, pain became severe and left great toe became red and extremely sore.PT has history of gout and last episode was year.

## 2016-05-11 ENCOUNTER — Encounter (HOSPITAL_COMMUNITY): Payer: Self-pay | Admitting: *Deleted

## 2016-05-11 ENCOUNTER — Ambulatory Visit (HOSPITAL_COMMUNITY)
Admission: EM | Admit: 2016-05-11 | Discharge: 2016-05-11 | Disposition: A | Discharge status: Home / Self Care | Payer: Medicare HMO | Attending: Emergency Medicine | Admitting: Emergency Medicine

## 2016-05-11 DIAGNOSIS — M10072 Idiopathic gout, left ankle and foot: Secondary | ICD-10-CM

## 2016-05-11 DIAGNOSIS — M109 Gout, unspecified: Secondary | ICD-10-CM

## 2016-05-11 MED ORDER — INDOMETHACIN 25 MG PO CAPS: 25.0000 mg | ORAL_CAPSULE | Freq: Three times a day (TID) | ORAL | Status: DC

## 2016-05-11 NOTE — ED Notes (Signed)
Pt  Reports   Symptoms  Of    Pain  And  Swelling  Of  The  l  Foot       Started  Two  Days  Ago  She  Reports  A  History  Of  Gout     She  Is  Sitting  Upright on the  Exam table  Speaking in  Complete  sentances      She  States  Dr  Jonathon Jordan  Did  A  Blood  Test  On  Her  And  It  Showed  Gout     She  Was  Put  On  allopurinoll

## 2016-05-11 NOTE — ED Provider Notes (Signed)
CSN: [id number]     Arrival date & time 05/11/16  1259 History   First MD Initiated Contact with Patient 05/11/16 1325     Chief Complaint  Patient presents with  . Gout   (Consider location/radiation/quality/duration/timing/severity/associated sxs/prior Treatment) HPI  The pt is a 65yo female presenting to [facility] with c/o Left great toe pain and swelling c/o gout flare for the last 2 days.  She has been taking Allopurinol as prescribed but she still got a gout flare. Last time this occurred was 3 months ago. Pain is aching and sore, moderate to severe. She has not taken anything for pain. She does not recall what she took for the last flare of gout.   Past Medical History  Diagnosis Date  . Hypertension   . Asthma   . Gout    History reviewed. No pertinent past surgical history. History reviewed. No pertinent family history. Social History  Substance Use Topics  . Smoking status: Never Smoker   . Smokeless tobacco: None  . Alcohol Use: No   OB History    No data available     Review of Systems  Constitutional: Negative for fever and chills.  Musculoskeletal: Positive for myalgias, joint swelling and arthralgias.       Left great toe  Skin: Positive for color change. Negative for rash and wound.  Neurological: Negative for weakness and numbness.    Allergies  Review of patient's allergies indicates no known allergies.  Home Medications   Prior to Admission medications   Medication Sig Start Date End Date Taking? Authorizing Provider  atenolol (TENORMIN) 25 MG tablet Take 25 mg by mouth daily.    Historical Provider, MD  Fluticasone-Salmeterol (ADVAIR DISKUS IN) Inhale into the lungs.    Historical Provider, MD  hydrochlorothiazide (HYDRODIURIL) 25 MG tablet Take 25 mg by mouth daily.    Historical Provider, MD  indomethacin (INDOCIN) 25 MG capsule Take 1 capsule (25 mg total) by mouth 3 (three) times daily with meals. 05/11/16   Noland Fordyce, PA-C  verapamil (CALAN) 120 MG  tablet Take 120 mg by mouth 3 (three) times daily.    Historical Provider, MD  verapamil (CALAN) 120 MG tablet Take 1 tablet (120 mg total) by mouth 3 (three) times daily. 02/17/13   Jeryl Columbia, NP   Meds Ordered and Administered this Visit  Medications - No data to display  BP 127/87 mmHg  Pulse 64  Temp(Src) 98.8 F (37.1 C) (Oral)  Resp 16  SpO2 96% No data found.   Physical Exam  Constitutional: She is oriented to person, place, and time. She appears well-developed and well-nourished.  HENT:  Head: Normocephalic and atraumatic.  Eyes: EOM are normal.  Neck: Normal range of motion.  Cardiovascular: Normal rate.   Pulmonary/Chest: Effort normal.  Musculoskeletal: Normal range of motion. She exhibits edema and tenderness.  Left great toe: mild to moderate edema from toe to dorsum of foot. Tenderness with light touch. Full ROM.   Neurological: She is alert and oriented to person, place, and time.  Skin: Skin is warm and dry. There is erythema.  Left great toe: skin in tact. Mild erythema with warmth.  Psychiatric: She has a normal mood and affect. Her behavior is normal.  Nursing note and vitals reviewed.   ED Course  Procedures (including critical care time)  Labs Review Labs Reviewed - No data to display  Imaging Review No results found.    MDM   1. Acute gout of  left foot, unspecified cause    Pt presenting with gout flare. Per medical records, it appears pt was on Indomethacin in the past. Will refill. Encouraged f/u with PCP in 1-2 weeks for recheck of symptoms. Pt info on gout and low purine diet provided. Patient verbalized understanding and agreement with treatment plan.     Noland Fordyce, PA-C 05/11/16 1431

## 2016-05-11 NOTE — Discharge Instructions (Signed)

## 2016-08-08 ENCOUNTER — Ambulatory Visit (HOSPITAL_COMMUNITY)
Admission: EM | Admit: 2016-08-08 | Discharge: 2016-08-08 | Disposition: A | Discharge status: Home / Self Care | Payer: Medicare HMO | Attending: Physician Assistant | Admitting: Physician Assistant

## 2016-08-08 ENCOUNTER — Encounter (HOSPITAL_COMMUNITY): Payer: Self-pay | Admitting: Emergency Medicine

## 2016-08-08 DIAGNOSIS — M109 Gout, unspecified: Secondary | ICD-10-CM

## 2016-08-08 DIAGNOSIS — M10071 Idiopathic gout, right ankle and foot: Secondary | ICD-10-CM

## 2016-08-08 MED ORDER — PREDNISONE 10 MG PO TABS: 20.0000 mg | ORAL_TABLET | Freq: Every day | ORAL | 0 refills | Status: DC

## 2016-08-08 MED ORDER — INDOMETHACIN 50 MG PO CAPS: 50.0000 mg | ORAL_CAPSULE | Freq: Two times a day (BID) | ORAL | 1 refills | Status: DC

## 2016-08-08 NOTE — ED Provider Notes (Signed)
CSN: [id number]     Arrival date & time 08/08/16  1231 History   First MD Initiated Contact with Patient 08/08/16 1321     Chief Complaint  Patient presents with  . Foot Pain   (Consider location/radiation/quality/duration/timing/severity/associated sxs/prior Treatment) HPI 65 y/o female with 2-3 day hx of swollen hot right great toe. Hx of gout. Similar to past symptoms. Ibuprofen and cold soaks not helping.  Past Medical History:  Diagnosis Date  . Asthma   . Gout   . Hypertension    History reviewed. No pertinent surgical history. History reviewed. No pertinent family history. Social History  Substance Use Topics  . Smoking status: Never Smoker  . Smokeless tobacco: Never Used  . Alcohol use No   OB History    No data available     Review of Systems  Denies: HEADACHE, NAUSEA, ABDOMINAL PAIN, CHEST PAIN, CONGESTION, DYSURIA, SHORTNESS OF BREATH  Allergies  Review of patient's allergies indicates no known allergies.  Home Medications   Prior to Admission medications   Medication Sig Start Date End Date Taking? Authorizing Provider  atenolol (TENORMIN) 25 MG tablet Take 25 mg by mouth daily.   Yes Historical Provider, MD  hydrochlorothiazide (HYDRODIURIL) 25 MG tablet Take 25 mg by mouth daily.   Yes Historical Provider, MD  verapamil (CALAN) 120 MG tablet Take 120 mg by mouth 3 (three) times daily.   Yes Historical Provider, MD  Fluticasone-Salmeterol (ADVAIR DISKUS IN) Inhale into the lungs.    Historical Provider, MD  indomethacin (INDOCIN) 25 MG capsule Take 1 capsule (25 mg total) by mouth 3 (three) times daily with meals. 05/11/16   Noland Fordyce, PA-C  indomethacin (INDOCIN) 50 MG capsule Take 1 capsule (50 mg total) by mouth 2 (two) times daily with a meal. 08/08/16   Konrad Felix, PA  predniSONE (DELTASONE) 10 MG tablet Take 2 tablets (20 mg total) by mouth daily. 08/08/16   Konrad Felix, PA  verapamil (CALAN) 120 MG tablet Take 1 tablet (120 mg total) by mouth 3  (three) times daily. 02/17/13   Jeryl Columbia, NP   Meds Ordered and Administered this Visit  Medications - No data to display  BP 148/87 (BP Location: Left Arm)   Pulse 60   Temp 98.3 F (36.8 C) (Oral)   Resp 16   SpO2 98%  No data found.   Physical Exam NURSES NOTES AND VITAL SIGNS REVIEWED. CONSTITUTIONAL: Well developed, well nourished, no acute distress HEENT: normocephalic, atraumatic EYES: Conjunctiva normal NECK:normal ROM, supple, no adenopathy PULMONARY:No respiratory distress, normal effort ABDOMINAL: Soft, ND, NT BS+, No CVAT MUSCULOSKELETAL: Normal ROM of all extremities, RIGHT foot, swollen right great toe, tender to palpate lateral. Warm to touch SKIN: warm and dry without rash PSYCHIATRIC: Mood and affect, behavior are normal  Urgent Care Course   Clinical Course    Procedures (including critical care time)  Labs Review Labs Reviewed - No data to display  Imaging Review No results found.   Visual Acuity Review  Right Eye Distance:   Left Eye Distance:   Bilateral Distance:    Right Eye Near:   Left Eye Near:    Bilateral Near:        Indocin, prednisone MDM   1. Acute gout of right foot, unspecified cause     Patient is reassured that there are no issues that require transfer to higher level of care at this time or additional tests. Patient is advised to continue home symptomatic  treatment. Patient is advised that if there are new or worsening symptoms to attend the emergency department, contact primary care provider, or return to UC. Instructions of care provided discharged home in stable condition.    THIS NOTE WAS GENERATED USING A VOICE RECOGNITION SOFTWARE PROGRAM. ALL REASONABLE EFFORTS  WERE MADE TO PROOFREAD THIS DOCUMENT FOR ACCURACY.  I have verbally reviewed the discharge instructions with the patient. A printed AVS was given to the patient.  All questions were answered prior to discharge.      Konrad Felix,  [place] 08/08/16 1635

## 2016-08-08 NOTE — ED Triage Notes (Signed)
The patient presented to the [facility] with a complaint of right foot pain and swelling that started 5 days ago. The patient reported a hx of gout and believed this to be another flare up.

## 2017-05-29 ENCOUNTER — Encounter (HOSPITAL_COMMUNITY): Payer: Self-pay | Admitting: Emergency Medicine

## 2017-05-29 ENCOUNTER — Ambulatory Visit (HOSPITAL_COMMUNITY)
Admission: EM | Admit: 2017-05-29 | Discharge: 2017-05-29 | Disposition: A | Discharge status: Home / Self Care | Payer: Medicare HMO | Attending: Family Medicine | Admitting: Family Medicine

## 2017-05-29 DIAGNOSIS — M654 Radial styloid tenosynovitis [de Quervain]: Secondary | ICD-10-CM

## 2017-05-29 MED ORDER — PREDNISONE 20 MG PO TABS: ORAL_TABLET | ORAL | 0 refills | Status: DC

## 2017-05-29 NOTE — ED Triage Notes (Signed)
The patient presented to the [facility] with a complaint of left hand pain and swelling x 3 days. The patient denied any known injury.

## 2017-05-29 NOTE — ED Provider Notes (Signed)
[facility]    CSN: [id number] Arrival date & time: 05/29/17  1158     History   Chief Complaint Chief Complaint  Patient presents with  . Hand Pain    HPI Diamond [name] is a 66 y.o. female.   The patient presented to the [facility] with a complaint of left hand pain and swelling x 3 days. The patient denied any known injury.   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 She was making the bed several days ago.  Patient has taken ibuprofen which offered some relief. She's not had this problem before. She has pain when she tries to make a fist.  Patient is right-handed.      Past Medical History:  Diagnosis Date  . Asthma   . Gout   . Hypertension     There are no active problems to display for this patient.   History reviewed. No pertinent surgical history.  OB History    No data available       Home Medications    Prior to Admission medications   Medication Sig Start Date End Date Taking? Authorizing Provider  atenolol (TENORMIN) 25 MG tablet Take 25 mg by mouth daily.    [provider]  Fluticasone-Salmeterol (ADVAIR DISKUS IN) Inhale into the lungs.    [provider]  hydrochlorothiazide (HYDRODIURIL) 25 MG tablet Take 25 mg by mouth daily.    [provider]  indomethacin (INDOCIN) 25 MG capsule Take 1 capsule (25 mg total) by mouth 3 (three) times daily with meals. 05/11/16   Noe Gens, PA-C  indomethacin (INDOCIN) 50 MG capsule Take 1 capsule (50 mg total) by mouth 2 (two) times daily with a meal. 08/08/16   Konrad Felix, PA  verapamil (CALAN) 120 MG tablet Take 120 mg by mouth 3 (three) times daily.    [provider]  verapamil (CALAN) 120 MG tablet Take 1 tablet (120 mg total) by mouth 3 (three) times daily. 02/17/13   Schorr, Rhetta Mura, NP    Family History History reviewed. No pertinent family history.  Social History Social History  Substance Use Topics    . Smoking status: Never Smoker  . Smokeless tobacco: Never Used  . Alcohol use No     Allergies   Patient has no known allergies.   Review of Systems Review of Systems  Musculoskeletal: Positive for joint swelling.  All other systems reviewed and are negative.    Physical Exam Triage Vital Signs ED Triage Vitals  Enc Vitals Group     BP 05/29/17 1224 133/79     Pulse Rate 05/29/17 1224 72     Resp 05/29/17 1224 18     Temp 05/29/17 1224 98.6 F (37 C)     Temp Source 05/29/17 1224 Oral     SpO2 05/29/17 1224 100 %     Weight --      Height --      Head Circumference --      Peak Flow --      Pain Score 05/29/17 1223 5     Pain Loc --      Pain Edu? --      Excl. in [place]? --    No data found.   Updated Vital Signs BP 133/79 (BP Location: Right Arm)   Pulse 72   Temp 98.6 F (37 C) (Oral)   Resp 18   SpO2 100%    Physical  Exam  Constitutional: She is oriented to person, place, and time. She appears well-developed and well-nourished.  HENT:  Right Ear: External ear normal.  Left Ear: External ear normal.  Eyes: Conjunctivae are normal.  Neck: Normal range of motion. Neck supple.  Pulmonary/Chest: Effort normal.  Musculoskeletal: She exhibits edema and tenderness. She exhibits no deformity.  Dorsal left hand is diffusely swollen and minimally tender. Skin is intact. Patient has pain when she tries to flex her fingers. The pain is located mostly over the dorsal left wrist. There is no bony deformity.  Neurological: She is alert and oriented to person, place, and time.  Skin: Skin is warm and dry. No erythema.  Nursing note and vitals reviewed.    UC Treatments / Results  Labs (all labs ordered are listed, but only abnormal results are displayed) Labs Reviewed - No data to display  EKG  EKG Interpretation None       Radiology No results found.  Procedures Procedures (including critical care time)  Medications Ordered in UC Medications - No  data to display   Initial Impression / Assessment and Plan / UC Course  I have reviewed the triage vital signs and the nursing notes.  Pertinent labs & imaging results that were available during my care of the patient were reviewed by me and considered in my medical decision making (see chart for details).     Final Clinical Impressions(s) / UC Diagnoses   Final diagnoses:  None    New Prescriptions New Prescriptions   No medications on file     [name], MD 05/29/17 1233

## 2017-05-29 NOTE — Discharge Instructions (Signed)
Please return if your wrist is not significantly improved in 24 hours.

## 2018-01-02 ENCOUNTER — Other Ambulatory Visit: Payer: Self-pay | Admitting: Nephrology

## 2018-01-02 DIAGNOSIS — N183 Chronic kidney disease, stage 3 unspecified: Secondary | ICD-10-CM

## 2018-01-09 ENCOUNTER — Ambulatory Visit
Admission: RE | Admit: 2018-01-09 | Discharge: 2018-01-09 | Disposition: A | Discharge status: Home / Self Care | Payer: Medicare HMO | Source: Ambulatory Visit | Attending: Nephrology | Admitting: Nephrology

## 2018-01-09 DIAGNOSIS — N183 Chronic kidney disease, stage 3 unspecified: Secondary | ICD-10-CM

## 2018-01-18 ENCOUNTER — Ambulatory Visit (HOSPITAL_COMMUNITY)
Admission: EM | Admit: 2018-01-18 | Discharge: 2018-01-18 | Disposition: A | Discharge status: Home / Self Care | Payer: Medicare HMO | Attending: Family Medicine | Admitting: Family Medicine

## 2018-01-18 ENCOUNTER — Other Ambulatory Visit: Payer: Self-pay

## 2018-01-18 ENCOUNTER — Encounter (HOSPITAL_COMMUNITY): Payer: Self-pay | Admitting: Emergency Medicine

## 2018-01-18 DIAGNOSIS — M109 Gout, unspecified: Secondary | ICD-10-CM | POA: Diagnosis not present

## 2018-01-18 MED ORDER — PREDNISONE 10 MG (21) PO TBPK: ORAL_TABLET | Freq: Every day | ORAL | 0 refills | Status: DC

## 2018-01-18 NOTE — ED Triage Notes (Signed)
Pt hx of gout. Pt states she has a gout flare up in her R foot x1 week. Pt is not taking medication at this time for gout.

## 2018-01-21 NOTE — ED Provider Notes (Signed)
  [facility]   [id number] 01/18/18 Arrival Time: 1010  ASSESSMENT & PLAN:  1. Acute gout involving toe of right foot, unspecified cause     Meds ordered this encounter  Medications  . predniSONE (STERAPRED UNI-PAK 21 TAB) 10 MG (21) TBPK tablet    Sig: Take by mouth daily. Take as directed.    Dispense:  21 tablet    Refill:  0   Will f/u with PCP if not showing improvement over the next week. Reviewed expectations re: course of current medical issues. Questions answered. Outlined signs and symptoms indicating need for more acute intervention. Patient verbalized understanding. After Visit Summary given.  SUBJECTIVE: History from: patient. Diamond [name] is a 67 y.o. female who reports localized moderate pain of her right foot/great toe that is stable; persistent; described as aching without radiation. Onset: gradual, 1 week ago. Injury/trama: no. Relieved by: nothing in particular. Worsened by: certain movements. Associated symptoms: none reported. Extremity sensation changes or weakness: none. Self treatment: has not tried OTCs for relief of pain. History of similar: yes, gout; "feels like the gout"; cannot remember last episode  ROS: As per HPI.   OBJECTIVE:  Vitals:   01/18/18 1121  BP: (!) 146/92  Pulse: 79  Resp: 16  Temp: 97.8 F (36.6 C)  SpO2: 97%    General appearance: alert; no distress Extremities: no cyanosis or edema; symmetrical with no gross deformities; localized tenderness over her right great toe/1st MTP with mild swelling and no bruising; some erythema; ROM: normal but painful CV: normal extremity capillary refill Skin: warm and dry Neurologic: normal gait; normal symmetric reflexes in all extremities; normal sensation in all extremities Psychological: alert and cooperative; normal mood and affect  No Known Allergies  Past Medical History:  Diagnosis Date  . Asthma   . Gout   . Hypertension    Social History    Socioeconomic History  . Marital status: Widowed    Spouse name: Not on file  . Number of children: Not on file  . Years of education: Not on file  . Highest education level: Not on file  Social Needs  . Financial resource strain: Not on file  . Food insecurity - worry: Not on file  . Food insecurity - inability: Not on file  . Transportation needs - medical: Not on file  . Transportation needs - non-medical: Not on file  Occupational History  . Not on file  Tobacco Use  . Smoking status: Never Smoker  . Smokeless tobacco: Never Used  Substance and Sexual Activity  . Alcohol use: No  . Drug use: No  . Sexual activity: Not on file  Other Topics Concern  . Not on file  Social History Narrative  . Not on file   History reviewed. No pertinent surgical history.    Vanessa Kick, MD 01/21/18 1150

## 2018-02-02 ENCOUNTER — Ambulatory Visit (HOSPITAL_COMMUNITY)
Admission: EM | Admit: 2018-02-02 | Discharge: 2018-02-02 | Disposition: A | Discharge status: Home / Self Care | Payer: Medicare HMO | Attending: Physician Assistant | Admitting: Physician Assistant

## 2018-02-02 ENCOUNTER — Encounter (HOSPITAL_COMMUNITY): Payer: Self-pay | Admitting: Emergency Medicine

## 2018-02-02 ENCOUNTER — Other Ambulatory Visit: Payer: Self-pay

## 2018-02-02 DIAGNOSIS — M79674 Pain in right toe(s): Secondary | ICD-10-CM

## 2018-02-02 MED ORDER — PREDNISONE 10 MG (21) PO TBPK: ORAL_TABLET | Freq: Every day | ORAL | 0 refills | Status: DC

## 2018-02-02 NOTE — Discharge Instructions (Signed)
As discussed, this could be due to gout, or irritation to the toe due to bunion.  Start prednisone as directed.  You can take Tylenol for the pain.  I have attached information on foods to avoid for gout.  Follow-up with PCP/kidney doctor for further evaluation and treatment needed.

## 2018-02-02 NOTE — ED Triage Notes (Signed)
Pt with hx of gout.  She was seen here 15 days ago and prescribed prednisone.  She states she took it as prescribed and it was getting better but then her right big toe began to hurt again.

## 2018-02-02 NOTE — ED Provider Notes (Signed)
[place]    CSN: [id number] Arrival date & time: 02/02/18  1446     History   Chief Complaint Chief Complaint  Patient presents with  . Gout    HPI Diamond [name] is a 67 y.o. female.   67 year old female with history of asthma, gout, HTN comes in for 3-day history of right great toe pain.  Patient states she was seen here 15 days ago for another gout attack, was prescribed prednisone with good relief.  States symptoms completely resolved prior to current symptom onset.  Has had pain of the right great toe, that radiates across the distal MTP.  The swelling, erythema, increased warmth of the right great toe as well.  She states she has not had any red meat, deli meat and last episode of gout, which is usually what triggers her gout attacks.  She is currently being seen by a nephrologist, and has been told she has some kind of kidney disease, although she does not know what. Denies fever, chills, night sweats.       Past Medical History:  Diagnosis Date  . Asthma   . Gout   . Hypertension     There are no active problems to display for this patient.   History reviewed. No pertinent surgical history.  OB History    No data available       Home Medications    Prior to Admission medications   Medication Sig Start Date End Date Taking? Authorizing Provider  atenolol (TENORMIN) 25 MG tablet Take 25 mg by mouth daily.   Yes [provider]  Fluticasone-Salmeterol (ADVAIR DISKUS IN) Inhale into the lungs.   Yes [provider]  hydrochlorothiazide (HYDRODIURIL) 25 MG tablet Take 25 mg by mouth daily.   Yes [provider]  verapamil (CALAN) 120 MG tablet Take 120 mg by mouth 3 (three) times daily.   Yes [provider]  predniSONE (STERAPRED UNI-PAK 21 TAB) 10 MG (21) TBPK tablet Take by mouth daily. Take as directed. 02/02/18   Ok Edwards, PA-C    Family History History reviewed. No pertinent family history.  Social  History Social History   Tobacco Use  . Smoking status: Never Smoker  . Smokeless tobacco: Never Used  Substance Use Topics  . Alcohol use: No  . Drug use: No     Allergies   Patient has no known allergies.   Review of Systems Review of Systems  Reason unable to perform ROS: See HPI as above.     Physical Exam Triage Vital Signs ED Triage Vitals  Enc Vitals Group     BP 02/02/18 1557 131/79     Pulse Rate 02/02/18 1557 60     Resp --      Temp 02/02/18 1557 98.2 F (36.8 C)     Temp Source 02/02/18 1557 Oral     SpO2 02/02/18 1557 100 %     Weight --      Height --      Head Circumference --      Peak Flow --      Pain Score 02/02/18 1554 7     Pain Loc --      Pain Edu? --      Excl. in [place]? --    No data found.  Updated Vital Signs BP 131/79 (BP Location: Left Arm)   Pulse 60   Temp 98.2 F (36.8 C) (Oral)   SpO2 100%   Physical  Exam  Constitutional: She is oriented to person, place, and time. She appears well-developed and well-nourished. No distress.  HENT:  Head: Normocephalic and atraumatic.  Eyes: Conjunctivae are normal. Pupils are equal, round, and reactive to light.  Musculoskeletal:  Swelling, erythema, increased warmth of the right great toe/1st MTP. ?bunion. Tenderness to palpation of right great toe. Full ROM. Sensation intact. Pedal pulses 2+ and equal bilaterally.   Neurological: She is alert and oriented to person, place, and time.   UC Treatments / Results  Labs (all labs ordered are listed, but only abnormal results are displayed) Labs Reviewed - No data to display  EKG  EKG Interpretation None       Radiology No results found.  Procedures Procedures (including critical care time)  Medications Ordered in UC Medications - No data to display   Initial Impression / Assessment and Plan / UC Course  I have reviewed the triage vital signs and the nursing notes.  Pertinent labs & imaging results that were available during  my care of the patient were reviewed by me and considered in my medical decision making (see chart for details).    Discussed gout versus irritation due to a possible bunion.  Patient did state she recently wore tight shoes, which could be causing the pain as well.  However, given swelling, erythema, increased warmth, will treat for gout.  Low purine diet information provided as well.  Tylenol for pain.  Patient to avoid NSAIDs for now given kidney disease.  To follow-up with nephrologist/PCP for further evaluation and treatment needed.  Return precautions given.  Patient expresses understanding and agrees to plan.  Final Clinical Impressions(s) / UC Diagnoses   Final diagnoses:  Great toe pain, right    ED Discharge Orders        Ordered    predniSONE (STERAPRED UNI-PAK 21 TAB) 10 MG (21) TBPK tablet  Daily     02/02/18 1655       Ok Edwards, [place] 02/02/18 1701

## 2018-07-12 ENCOUNTER — Encounter (HOSPITAL_COMMUNITY): Payer: Self-pay | Admitting: Emergency Medicine

## 2018-07-12 ENCOUNTER — Ambulatory Visit (HOSPITAL_COMMUNITY)
Admission: EM | Admit: 2018-07-12 | Discharge: 2018-07-12 | Disposition: A | Discharge status: Home / Self Care | Payer: Medicare HMO | Attending: Family Medicine | Admitting: Family Medicine

## 2018-07-12 ENCOUNTER — Other Ambulatory Visit: Payer: Self-pay

## 2018-07-12 DIAGNOSIS — M654 Radial styloid tenosynovitis [de Quervain]: Secondary | ICD-10-CM

## 2018-07-12 MED ORDER — NAPROXEN 500 MG PO TABS: 500.0000 mg | ORAL_TABLET | Freq: Two times a day (BID) | ORAL | 0 refills | Status: DC

## 2018-07-12 NOTE — ED Triage Notes (Addendum)
Pt reports pain in her left thumb and hand since Sunday.  She denies any injury to the hand. Pt is wearing a brace that she brought from home.

## 2018-07-12 NOTE — ED Provider Notes (Signed)
[place]   [id number] 07/12/18 Arrival Time: 1000  ASSESSMENT & PLAN:  1. Tendinitis, de Quervain's     Meds ordered this encounter  Medications  . naproxen (NAPROSYN) 500 MG tablet    Sig: Take 1 tablet (500 mg total) by mouth 2 (two) times daily.    Dispense:  20 tablet    Refill:  0    Follow-up Information    Osei-Bonsu, Iona Beard, MD.   Specialty:  Internal Medicine Why:  As needed or if not improving over the next week. Contact information: [address] [place] [address] [phone]          Placed in thumb spica splint. Written information given.  Reviewed expectations re: course of current medical issues. Questions answered. Outlined signs and symptoms indicating need for more acute intervention. Patient verbalized understanding. After Visit Summary given.  SUBJECTIVE: History from: patient. Diamond [name] is a 67 y.o. female who reports persistent moderate pain of her left wrist at base of thumb that is gradually worsening; described as aching with sharp exacerbations from certain wrist movements. Pain without radiation. Onset: gradual, several days ago. Injury/trama: no. Relieved by: rest; OTC brace with mild help. Worsened by: certain movements. Associated symptoms: none reported. Extremity sensation changes or weakness: none. Self treatment: has not tried OTCs for relief of pain. History of similar: no  ROS: As per HPI.   OBJECTIVE:  Vitals:   07/12/18 1043  BP: 127/75  Pulse: 62  Temp: 97.9 F (36.6 C)  TempSrc: Oral  SpO2: 100%    General appearance: alert; no distress Extremities: warm and well perfused; symmetrical with no gross deformities; pain reported over radial side of L wrist, more notable with thumb and wrist movement; no erythema or inflammation; no swelling; FROM; very tender over radial styloid CV: normal extremity capillary refill Skin: warm and dry Neurologic: normal gait; normal symmetric  reflexes in all extremities; normal sensation in all extremities Psychological: alert and cooperative; normal mood and affect  No Known Allergies  Past Medical History:  Diagnosis Date  . Asthma   . Gout   . Hypertension    Social History   Socioeconomic History  . Marital status: Widowed    Spouse name: Not on file  . Number of children: Not on file  . Years of education: Not on file  . Highest education level: Not on file  Occupational History  . Not on file  Social Needs  . Financial resource strain: Not on file  . Food insecurity:    Worry: Not on file    Inability: Not on file  . Transportation needs:    Medical: Not on file    Non-medical: Not on file  Tobacco Use  . Smoking status: Never Smoker  . Smokeless tobacco: Never Used  Substance and Sexual Activity  . Alcohol use: No  . Drug use: No  . Sexual activity: Not on file  Lifestyle  . Physical activity:    Days per week: Not on file    Minutes per session: Not on file  . Stress: Not on file  Relationships  . Social connections:    Talks on phone: Not on file    Gets together: Not on file    Attends religious service: Not on file    Active member of club or organization: Not on file    Attends meetings of clubs or organizations: Not on file    Relationship status: Not on file  .  Intimate partner violence:    Fear of current or ex partner: Not on file    Emotionally abused: Not on file    Physically abused: Not on file    Forced sexual activity: Not on file  Other Topics Concern  . Not on file  Social History Narrative  . Not on file   History reviewed. No pertinent family history. History reviewed. No pertinent surgical history.    Vanessa Kick, MD 07/12/18 1135

## 2019-10-16 ENCOUNTER — Other Ambulatory Visit: Payer: Self-pay

## 2019-10-16 ENCOUNTER — Inpatient Hospital Stay (HOSPITAL_COMMUNITY)
Admission: EM | Admit: 2019-10-16 | Discharge: 2019-10-21 | DRG: 177 | Disposition: A | Discharge status: Home / Self Care | Payer: Medicare HMO | Attending: Family Medicine | Admitting: Family Medicine

## 2019-10-16 ENCOUNTER — Emergency Department (HOSPITAL_COMMUNITY): Payer: Medicare HMO

## 2019-10-16 ENCOUNTER — Encounter (HOSPITAL_COMMUNITY): Payer: Self-pay | Admitting: Emergency Medicine

## 2019-10-16 DIAGNOSIS — Z8249 Family history of ischemic heart disease and other diseases of the circulatory system: Secondary | ICD-10-CM

## 2019-10-16 DIAGNOSIS — J1289 Other viral pneumonia: Secondary | ICD-10-CM | POA: Diagnosis present

## 2019-10-16 DIAGNOSIS — N179 Acute kidney failure, unspecified: Secondary | ICD-10-CM | POA: Diagnosis present

## 2019-10-16 DIAGNOSIS — D631 Anemia in chronic kidney disease: Secondary | ICD-10-CM | POA: Diagnosis present

## 2019-10-16 DIAGNOSIS — Z7951 Long term (current) use of inhaled steroids: Secondary | ICD-10-CM | POA: Diagnosis not present

## 2019-10-16 DIAGNOSIS — E669 Obesity, unspecified: Secondary | ICD-10-CM | POA: Diagnosis present

## 2019-10-16 DIAGNOSIS — R8281 Pyuria: Secondary | ICD-10-CM | POA: Diagnosis present

## 2019-10-16 DIAGNOSIS — N1831 Chronic kidney disease, stage 3a: Secondary | ICD-10-CM | POA: Diagnosis present

## 2019-10-16 DIAGNOSIS — N390 Urinary tract infection, site not specified: Secondary | ICD-10-CM | POA: Diagnosis not present

## 2019-10-16 DIAGNOSIS — M1A9XX0 Chronic gout, unspecified, without tophus (tophi): Secondary | ICD-10-CM | POA: Diagnosis not present

## 2019-10-16 DIAGNOSIS — D638 Anemia in other chronic diseases classified elsewhere: Secondary | ICD-10-CM | POA: Diagnosis not present

## 2019-10-16 DIAGNOSIS — Z801 Family history of malignant neoplasm of trachea, bronchus and lung: Secondary | ICD-10-CM | POA: Diagnosis not present

## 2019-10-16 DIAGNOSIS — I129 Hypertensive chronic kidney disease with stage 1 through stage 4 chronic kidney disease, or unspecified chronic kidney disease: Secondary | ICD-10-CM | POA: Diagnosis present

## 2019-10-16 DIAGNOSIS — R531 Weakness: Secondary | ICD-10-CM | POA: Diagnosis present

## 2019-10-16 DIAGNOSIS — E871 Hypo-osmolality and hyponatremia: Secondary | ICD-10-CM | POA: Diagnosis present

## 2019-10-16 DIAGNOSIS — E872 Acidosis: Secondary | ICD-10-CM | POA: Diagnosis present

## 2019-10-16 DIAGNOSIS — M109 Gout, unspecified: Secondary | ICD-10-CM | POA: Diagnosis present

## 2019-10-16 DIAGNOSIS — E876 Hypokalemia: Secondary | ICD-10-CM | POA: Diagnosis present

## 2019-10-16 DIAGNOSIS — I10 Essential (primary) hypertension: Secondary | ICD-10-CM | POA: Diagnosis present

## 2019-10-16 DIAGNOSIS — U071 COVID-19: Secondary | ICD-10-CM | POA: Diagnosis present

## 2019-10-16 DIAGNOSIS — J9600 Acute respiratory failure, unspecified whether with hypoxia or hypercapnia: Secondary | ICD-10-CM | POA: Diagnosis present

## 2019-10-16 DIAGNOSIS — Z79899 Other long term (current) drug therapy: Secondary | ICD-10-CM

## 2019-10-16 DIAGNOSIS — D649 Anemia, unspecified: Secondary | ICD-10-CM | POA: Diagnosis not present

## 2019-10-16 DIAGNOSIS — J189 Pneumonia, unspecified organism: Secondary | ICD-10-CM | POA: Insufficient documentation

## 2019-10-16 DIAGNOSIS — Z6841 Body Mass Index (BMI) 40.0 and over, adult: Secondary | ICD-10-CM | POA: Diagnosis not present

## 2019-10-16 DIAGNOSIS — J45909 Unspecified asthma, uncomplicated: Secondary | ICD-10-CM | POA: Diagnosis present

## 2019-10-16 DIAGNOSIS — I4891 Unspecified atrial fibrillation: Secondary | ICD-10-CM | POA: Diagnosis not present

## 2019-10-16 DIAGNOSIS — Z800 Family history of malignant neoplasm of digestive organs: Secondary | ICD-10-CM | POA: Diagnosis not present

## 2019-10-16 LAB — PHOSPHORUS: Phosphorus: 2.6 mg/dL (ref 2.5–4.6)

## 2019-10-16 LAB — BASIC METABOLIC PANEL
Anion gap: 14 (ref 5–15)
BUN: 69 mg/dL — ABNORMAL HIGH (ref 8–23)
CO2: 19 mmol/L — ABNORMAL LOW (ref 22–32)
Calcium: 8.4 mg/dL — ABNORMAL LOW (ref 8.9–10.3)
Chloride: 99 mmol/L (ref 98–111)
Creatinine, Ser: 3.78 mg/dL — ABNORMAL HIGH (ref 0.44–1.00)
GFR calc Af Amer: 13 mL/min — ABNORMAL LOW (ref >60)
GFR calc non Af Amer: 12 mL/min — ABNORMAL LOW (ref >60)
Glucose, Bld: 120 mg/dL — ABNORMAL HIGH (ref 70–99)
Potassium: 4.2 mmol/L (ref 3.5–5.1)
Sodium: 132 mmol/L — ABNORMAL LOW (ref 135–145)

## 2019-10-16 LAB — HEPATIC FUNCTION PANEL
ALT: 24 U/L (ref 0–44)
AST: 47 U/L — ABNORMAL HIGH (ref 15–41)
Albumin: 2.6 g/dL — ABNORMAL LOW (ref 3.5–5.0)
Alkaline Phosphatase: 29 U/L — ABNORMAL LOW (ref 38–126)
Bilirubin, Direct: 0.4 mg/dL — ABNORMAL HIGH (ref 0.0–0.2)
Indirect Bilirubin: 0.6 mg/dL (ref 0.3–0.9)
Total Bilirubin: 1 mg/dL (ref 0.3–1.2)
Total Protein: 7.1 g/dL (ref 6.5–8.1)

## 2019-10-16 LAB — CBC
HCT: 28.8 % — ABNORMAL LOW (ref 36.0–46.0)
Hemoglobin: 9.5 g/dL — ABNORMAL LOW (ref 12.0–15.0)
MCH: 32 pg (ref 26.0–34.0)
MCHC: 33 g/dL (ref 30.0–36.0)
MCV: 97 fL (ref 80.0–100.0)
Platelets: 194 10*3/uL (ref 150–400)
RBC: 2.97 MIL/uL — ABNORMAL LOW (ref 3.87–5.11)
RDW: 13.2 % (ref 11.5–15.5)
WBC: 7 10*3/uL (ref 4.0–10.5)
nRBC: 0 % (ref 0.0–0.2)

## 2019-10-16 LAB — PROTIME-INR
INR: 1 (ref 0.8–1.2)
Prothrombin Time: 13.4 seconds (ref 11.4–15.2)

## 2019-10-16 LAB — LIPASE, BLOOD: Lipase: 82 U/L — ABNORMAL HIGH (ref 11–51)

## 2019-10-16 LAB — APTT: aPTT: 29 seconds (ref 24–36)

## 2019-10-16 LAB — MAGNESIUM: Magnesium: 2.2 mg/dL (ref 1.7–2.4)

## 2019-10-16 LAB — PROCALCITONIN: Procalcitonin: 0.17 ng/mL

## 2019-10-16 MED ORDER — VERAPAMIL HCL 120 MG PO TABS
120.0000 mg | ORAL_TABLET | Freq: Three times a day (TID) | ORAL | Status: DC
  Administered 2019-10-17 – 2019-10-21 (×12): 120 mg via ORAL
  Filled 2019-10-16 (×18): qty 1

## 2019-10-16 MED ORDER — METHYLPREDNISOLONE SODIUM SUCC 125 MG IJ SOLR
125.0000 mg | Freq: Once | INTRAMUSCULAR | Status: AC
  Administered 2019-10-17: 125 mg via INTRAVENOUS
  Filled 2019-10-16: qty 2

## 2019-10-16 MED ORDER — SODIUM CHLORIDE 0.9 % IV SOLN
500.0000 mg | INTRAVENOUS | Status: DC
  Filled 2019-10-16: qty 500

## 2019-10-16 MED ORDER — ENOXAPARIN SODIUM 30 MG/0.3ML ~~LOC~~ SOLN
30.0000 mg | SUBCUTANEOUS | Status: DC
  Filled 2019-10-16 (×2): qty 0.3

## 2019-10-16 MED ORDER — MOMETASONE FURO-FORMOTEROL FUM 200-5 MCG/ACT IN AERO
2.0000 | INHALATION_SPRAY | Freq: Two times a day (BID) | RESPIRATORY_TRACT | Status: DC
  Administered 2019-10-17 – 2019-10-21 (×8): 2 via RESPIRATORY_TRACT
  Filled 2019-10-16 (×2): qty 8.8

## 2019-10-16 MED ORDER — ACETAMINOPHEN 650 MG RE SUPP: 650.0000 mg | Freq: Four times a day (QID) | RECTAL | Status: DC | PRN

## 2019-10-16 MED ORDER — PROCHLORPERAZINE EDISYLATE 10 MG/2ML IJ SOLN: 5.0000 mg | INTRAMUSCULAR | Status: DC | PRN

## 2019-10-16 MED ORDER — ALBUTEROL SULFATE HFA 108 (90 BASE) MCG/ACT IN AERS
2.0000 | INHALATION_SPRAY | RESPIRATORY_TRACT | Status: DC | PRN
  Administered 2019-10-17 – 2019-10-19 (×5): 2 via RESPIRATORY_TRACT
  Filled 2019-10-16: qty 6.7

## 2019-10-16 MED ORDER — ATENOLOL 25 MG PO TABS
25.0000 mg | ORAL_TABLET | Freq: Every day | ORAL | Status: DC
  Administered 2019-10-17: 25 mg via ORAL
  Filled 2019-10-16 (×3): qty 1

## 2019-10-16 MED ORDER — SODIUM CHLORIDE 0.9 % IV SOLN
2.0000 g | INTRAVENOUS | Status: DC
  Administered 2019-10-16: 2 g via INTRAVENOUS
  Filled 2019-10-16: qty 20

## 2019-10-16 MED ORDER — ACETAMINOPHEN 325 MG PO TABS
650.0000 mg | ORAL_TABLET | Freq: Four times a day (QID) | ORAL | Status: DC | PRN
  Filled 2019-10-16: qty 2

## 2019-10-16 MED ORDER — SODIUM CHLORIDE 0.9 % IV BOLUS
1000.0000 mL | Freq: Once | INTRAVENOUS | Status: AC
  Administered 2019-10-16: 1000 mL via INTRAVENOUS

## 2019-10-16 MED ORDER — ACETAMINOPHEN 325 MG PO TABS
650.0000 mg | ORAL_TABLET | Freq: Once | ORAL | Status: AC | PRN
  Administered 2019-10-16: 650 mg via ORAL
  Filled 2019-10-16: qty 2

## 2019-10-16 NOTE — ED Notes (Signed)
Pt made aware of need for urine sample.  

## 2019-10-16 NOTE — H&P (Signed)
History and Physical    Diamond [name] TFT:[id number] DOB: 06-18-1951 DOA: 10/16/2019  PCP: [name], MD   Patient coming from: Home.  I have personally briefly reviewed patient's old medical records in [facility]  Chief Complaint: Fever, cough and weakness.  HPI: Diamond [name] is a 68 y.o. female with medical history significant of asthma, gout, hypertension who is coming to the emergency department due to fever, cough and weakness for the past 3 weeks.  Her cough has occasional small amounts of yellowish sputum.  She has been wheezing and feeling fatigued.  She stated she saw her PCP and was given some prednisone, which she finished a few days ago.  She denies chest pain, or shortness of breath.  However, she states she has been using her rescue inhaler more often than usual.  Denies headache, rhinorrhea, sore throat, hemoptysis, dizziness, palpitations, PND, orthopnea or pitting edema of the lower extremities.  She denies abdominal pain, nausea or vomiting, constipation, melena or hematochezia.  She states that she has some diarrhea yesterday after she drank berry juice, but has since then subsided.  No dysuria, frequency hematuria.  Denies polyuria, polydipsia, polyphagia or blurred vision.  ED Course: Initial vital signs temperature 101.2 F, pulse 85, respiration 20, blood pressure 124/80 mmHg and O2 sat 95% on room air.  The patient was given a 1000 mL NS bolus.  CBC shows a white count of 7.0, hemoglobin 9.5 g/dL and platelets 194.  PT/INR/PTT are normal.  Sodium 132, potassium 4.2, chloride 99 and CO2 19 mmol/L.  Anion gap was normal at 14.  Glucose 120, BUN 69, creatinine 3.7, phosphorus 2.6, magnesium 2.2 and calcium 8.4 mg/dL.  Total protein is 2.6 g/dL.  Total bilirubin 1.0 and direct bilirubin 0.4 mg/dL.  Lipase was 82, AST mildly elevated at 47 and alkaline phosphatase mildly decreased at 29 units/L, all other values are within expected range.  Procalcitonin was 0.17  ng/mL.  Imaging: Her 1 view chest radiograph shows bibasilar atelectasis with peripheral infiltrates in both lungs consistent with pneumonia, including viral pneumonia.  Please see image and full radiology report for further detail.  Review of Systems: As per HPI otherwise 10 point review of systems negative.   Past Medical History:  Diagnosis Date  . Asthma   . Gout   . Hypertension     History reviewed. No pertinent surgical history.   reports that she has never smoked. She has never used smokeless tobacco. She reports that she does not drink alcohol or use drugs.  No Known Allergies  Family medical history Mother hypertension. Father hypertension. Brother #1 colon cancer. Brother #2 lung cancer.  Prior to Admission medications   Medication Sig Start Date End Date Taking? Authorizing Provider  albuterol (VENTOLIN HFA) 108 (90 Base) MCG/ACT inhaler Inhale 2 puffs into the lungs every 4 (four) hours as needed for wheezing or shortness of breath. 10/08/19  Yes [provider]  atenolol (TENORMIN) 25 MG tablet Take 25 mg by mouth daily.   Yes [provider]  Fluticasone-Salmeterol (ADVAIR DISKUS) 250-50 MCG/DOSE AEPB Inhale 1 puff into the lungs 2 (two) times daily.    Yes [provider]  hydrochlorothiazide (HYDRODIURIL) 25 MG tablet Take 25 mg by mouth daily.   Yes [provider]  verapamil (CALAN) 120 MG tablet Take 120 mg by mouth 3 (three) times daily.   Yes [provider]  naproxen (NAPROSYN) 500 MG tablet Take 1 tablet (500 mg total) by mouth  2 (two) times daily. Patient not taking: Reported on 10/16/2019 07/12/18   Vanessa Kick, MD    Physical Exam: Vitals:   10/16/19 1704 10/16/19 1734 10/16/19 1900 10/16/19 2000  BP:  120/86 111/84 127/70  Pulse:  72 70 69  Resp:  (!) 22 (!) 22 17  Temp: 98.8 F (37.1 C)     TempSrc: Oral     SpO2:  97% 93% 95%    Constitutional: Mildly febrile, looks acutely ill. Eyes: PERRL,  lids and conjunctivae normal ENMT: Mucous membranes are mildly dry.  Posterior pharynx clear of any exudate or lesions. Neck: normal, supple, no masses, no thyromegaly Respiratory: Decreased breath sounds with bilateral mild rhonchi and crackles. Normal respiratory effort. No accessory muscle use.  Cardiovascular: Regular rate and rhythm, no murmurs / rubs / gallops. No extremity edema. 2+ pedal pulses. No carotid bruits.  Abdomen: Soft, no tenderness, no masses palpated. No hepatosplenomegaly. Bowel sounds positive.  Musculoskeletal: no clubbing / cyanosis.  Good ROM, no contractures. Normal muscle tone.  Skin: no rashes, lesions, ulcers on limited dermatological exam. Neurologic: CN 2-12 grossly intact. Sensation intact, DTR normal. Strength 5/5 in all 4.  Psychiatric: Normal judgment and insight. Alert and oriented x 3. Normal mood.   Labs on Admission: I have personally reviewed following labs and imaging studies  CBC: Recent Labs  Lab 10/16/19 1730  WBC 7.0  HGB 9.5*  HCT 28.8*  MCV 97.0  PLT 154   Basic Metabolic Panel: Recent Labs  Lab 10/16/19 1730  NA 132*  K 4.2  CL 99  CO2 19*  GLUCOSE 120*  BUN 69*  CREATININE 3.78*  CALCIUM 8.4*   GFR: CrCl cannot be calculated (Unknown ideal weight.). Liver Function Tests: Recent Labs  Lab 10/16/19 1730  AST 47*  ALT 24  ALKPHOS 29*  BILITOT 1.0  PROT 7.1  ALBUMIN 2.6*   Recent Labs  Lab 10/16/19 1730  LIPASE 82*   No results for input(s): AMMONIA in the last 168 hours. Coagulation Profile: No results for input(s): INR, PROTIME in the last 168 hours. Cardiac Enzymes: No results for input(s): CKTOTAL, CKMB, CKMBINDEX, TROPONINI in the last 168 hours. BNP (last 3 results) No results for input(s): PROBNP in the last 8760 hours. HbA1C: No results for input(s): HGBA1C in the last 72 hours. CBG: No results for input(s): GLUCAP in the last 168 hours. Lipid Profile: No results for input(s): CHOL, HDL, LDLCALC,  TRIG, CHOLHDL, LDLDIRECT in the last 72 hours. Thyroid Function Tests: No results for input(s): TSH, T4TOTAL, FREET4, T3FREE, THYROIDAB in the last 72 hours. Anemia Panel: No results for input(s): VITAMINB12, FOLATE, FERRITIN, TIBC, IRON, RETICCTPCT in the last 72 hours. Urine analysis: No results found for: COLORURINE, APPEARANCEUR, LABSPEC, [place], GLUCOSEU, [place], [place], [place], PROTEINUR, UROBILINOGEN, NITRITE, LEUKOCYTESUR  Radiological Exams on Admission: Dg Chest Port 1 View  Result Date: 10/16/2019 CLINICAL DATA:  Fever EXAM: PORTABLE CHEST 1 VIEW COMPARISON:  Portable exam 1654 hours compared to 10/15/2010 FINDINGS: Borderline enlargement of cardiac silhouette. Mediastinal contours and pulmonary vascularity normal. Peripheral infiltrates in both lungs consistent with pneumonia, including atypical etiologies. Bibasilar atelectasis. No pleural effusion or pneumothorax. Bones unremarkable. IMPRESSION: Bibasilar atelectasis with peripheral infiltrates in both lungs consistent with pneumonia, including atypical etiologies including viral pneumonia. Electronically Signed   By: Lavonia Dana M.D.   On: 10/16/2019 17:00    EKG: Independently reviewed. Vent. rate 72 BPM PR interval * ms QRS duration 142 ms QT/QTc 501/549 ms P-R-T axes 67 29  26 Sinus rhythm Right bundle branch block  Assessment/Plan Principal Problem:   CAP (community acquired pneumonia) This is likely a viral pneumonia given low procalcitonin. Hold antibiotics for now. Supplemental oxygen as needed. Continue Advair 1 inhalation twice daily. Combivent 2 puffs every 6 hours. Follow-up COVID-19 testing. Follow blood cultures and sensitivity. Check sputum Gram stain, culture and sensitivity.  Active Problems:   Hypertension Continue atenolol 25 mg p.o. daily. Continue verapamil 120 mg p.o. daily. Hold hydrochlorothiazide. Monitor BP, HR, renal function electrolytes.    Asthma Solu-Medrol 125 mg IVP x1.  Continue Advair inhaler. Continue rescue inhaler.    Gout Continue allopurinol.    AKI (acute kidney injury) ([place]) Hold hydrochlorothiazide. Gentle/time-limited IV hydration. Monitor intake and output. Follow-up renal function electrolytes.    Hyponatremia Hold hydrochlorothiazide. Continue NS infusion. Follow-up renal function electrolytes.    Normocytic anemia Check anemia panel. Follow-up H&H.   DVT prophylaxis: Lovenox SQ. Code Status: Full code. Family Communication:  Disposition Plan: Admit for pneumonia treatment. Consults called:  Admission status: Inpatient/telemetry.   Reubin Milan MD [facility]  If 7PM-7AM, please contact night-coverage [email or web address]  10/16/2019, 8:40 PM   This document was prepared using Dragon voice recognition software and may contain some unintended transcription errors.

## 2019-10-16 NOTE — ED Triage Notes (Signed)
Patient reports fever with cough and weakness x3 weeks. States seen at PCP and give prednisone with some relief. Hx asthma. Denies new SOB or chest pain. Febrile in triage.

## 2019-10-16 NOTE — ED Provider Notes (Addendum)
[facility] DEPT Provider Note   CSN: [id number] Arrival date & time: 10/16/19  1532     History   Chief Complaint Chief Complaint  Patient presents with  . Fever  . Weakness    HPI Diamond [name] is a 68 y.o. female.     HPI Patient with a history of asthma and hypertension presents with concern for cough, weakness.   Patient states that she was generally well until about a few weeks ago.  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 Her last dose of this medication was 4 days ago.  She notes that since that time she has had persistent, progressive weakness generalized without focality.  No pain. She is unaware of fever until today. Symptoms have been persistent/worsening in spite of using her home albuterol, and as above previously provided medication.  She is unaware of any positive sick contacts including individuals with coronavirus.   Past Medical History:  Diagnosis Date  . Asthma   . Gout   . Hypertension     There are no active problems to display for this patient.   History reviewed. No pertinent surgical history.   OB History   No obstetric history on file.      Home Medications    Prior to Admission medications   Medication Sig Start Date End Date Taking? Authorizing Provider  albuterol (VENTOLIN HFA) 108 (90 Base) MCG/ACT inhaler Inhale 2 puffs into the lungs every 4 (four) hours as needed for wheezing or shortness of breath. 10/08/19  Yes [provider]  atenolol (TENORMIN) 25 MG tablet Take 25 mg by mouth daily.   Yes [provider]  Fluticasone-Salmeterol (ADVAIR DISKUS) 250-50 MCG/DOSE AEPB Inhale 1 puff into the lungs 2 (two) times daily.    Yes [provider]  hydrochlorothiazide (HYDRODIURIL) 25 MG tablet Take 25 mg by mouth daily.   Yes [provider]  verapamil (CALAN) 120 MG tablet Take  120 mg by mouth 3 (three) times daily.   Yes [provider]  naproxen (NAPROSYN) 500 MG tablet Take 1 tablet (500 mg total) by mouth 2 (two) times daily. Patient not taking: Reported on 10/16/2019 07/12/18   Vanessa Kick, MD    Family History No family history on file.  Social History Social History   Tobacco Use  . Smoking status: Never Smoker  . Smokeless tobacco: Never Used  Substance Use Topics  . Alcohol use: No  . Drug use: No     Allergies   Patient has no known allergies.   Review of Systems Review of Systems  Constitutional:       Per HPI, otherwise negative  HENT:       Per HPI, otherwise negative  Respiratory:       Per HPI, otherwise negative  Cardiovascular:       Per HPI, otherwise negative  Gastrointestinal: Negative for vomiting.  Endocrine:       Negative aside from HPI  Genitourinary:       Neg aside from HPI   Musculoskeletal:       Per HPI, otherwise negative  Skin: Negative.   Neurological: Negative for syncope.     Physical Exam Updated Vital Signs BP 111/84   Pulse 70   Temp 98.8 F (37.1 C) (Oral)   Resp (!) 22   SpO2 93%   Physical Exam Vitals signs and  nursing note reviewed.  Constitutional:      General: She is not in acute distress.    Appearance: She is well-developed.  HENT:     Head: Normocephalic and atraumatic.  Eyes:     Conjunctiva/sclera: Conjunctivae normal.  Cardiovascular:     Rate and Rhythm: Normal rate and regular rhythm.  Pulmonary:     Effort: Pulmonary effort is normal. No respiratory distress.     Breath sounds: Normal breath sounds. No stridor.  Abdominal:     General: There is no distension.  Skin:    General: Skin is warm and dry.  Neurological:     Mental Status: She is alert and oriented to person, place, and time.     Cranial Nerves: No cranial nerve deficit.      ED Treatments / Results  Labs (all labs ordered are listed, but only abnormal results are displayed) Labs  Reviewed  BASIC METABOLIC PANEL - Abnormal; Notable for the following components:      Result Value   Sodium 132 (*)    CO2 19 (*)    Glucose, Bld 120 (*)    BUN 69 (*)    Creatinine, Ser 3.78 (*)    Calcium 8.4 (*)    GFR calc non Af Amer 12 (*)    GFR calc Af Amer 13 (*)    All other components within normal limits  CBC - Abnormal; Notable for the following components:   RBC 2.97 (*)    Hemoglobin 9.5 (*)    HCT 28.8 (*)    All other components within normal limits  HEPATIC FUNCTION PANEL - Abnormal; Notable for the following components:   Albumin 2.6 (*)    AST 47 (*)    Alkaline Phosphatase 29 (*)    Bilirubin, Direct 0.4 (*)    All other components within normal limits  LIPASE, BLOOD - Abnormal; Notable for the following components:   Lipase 82 (*)    All other components within normal limits  SARS CORONAVIRUS 2 (TAT 6-24 HRS)  URINALYSIS, ROUTINE W REFLEX MICROSCOPIC    EKG EKG Interpretation  Date/Time:  Tuesday October 16 2019 17:39:48 EST Ventricular Rate:  72 PR Interval:    QRS Duration: 142 QT Interval:  501 QTC Calculation: 549 R Axis:   29 Text Interpretation: Sinus rhythm Right bundle branch block Abnormal ECG Confirmed by Carmin Muskrat [phone]) on 10/16/2019 8:03:39 PM   Radiology Dg Chest Port 1 View  Result Date: 10/16/2019 CLINICAL DATA:  Fever EXAM: PORTABLE CHEST 1 VIEW COMPARISON:  Portable exam 1654 hours compared to 10/15/2010 FINDINGS: Borderline enlargement of cardiac silhouette. Mediastinal contours and pulmonary vascularity normal. Peripheral infiltrates in both lungs consistent with pneumonia, including atypical etiologies. Bibasilar atelectasis. No pleural effusion or pneumothorax. Bones unremarkable. IMPRESSION: Bibasilar atelectasis with peripheral infiltrates in both lungs consistent with pneumonia, including atypical etiologies including viral pneumonia. Electronically Signed   By: Lavonia Dana M.D.   On: 10/16/2019 17:00     Procedures Procedures (including critical care time)  Medications Ordered in ED Medications  sodium chloride 0.9 % bolus 1,000 mL (has no administration in time range)  acetaminophen (TYLENOL) tablet 650 mg (650 mg Oral Given 10/16/19 1605)     Initial Impression / Assessment and Plan / ED Course  I have reviewed the triage vital signs and the nursing notes.  Pertinent labs & imaging results that were available during my care of the patient were reviewed by me and considered in my medical  decision making (see chart for details).    Patient febrile on arrival, but improved with Tylenol.   Initial findings notable for x-ray concerning for bilateral infiltrates concerning for atypical versus classic pneumonia.    7:44 PM On repeat exam the patient is awake and alert. Initial labs notable for acute kidney injury with creatinine almost 4, BUN 69. This is substantially beyond baseline. Patient's fever has resolved, but with concern for acute kidney injury, abnormal x-ray concerning for pneumonia, possibly viral, possibly Covid, the patient will require admission.  On IV fluids as started. Antibiotics held pending Covid test.  LETHIA DONLON was evaluated in Emergency Department on 10/16/2019 for the symptoms described in the history of present illness. She was evaluated in the context of the global COVID-19 pandemic, which necessitated consideration that the patient might be at risk for infection with the SARS-CoV-2 virus that causes COVID-19. Institutional protocols and algorithms that pertain to the evaluation of patients at risk for COVID-19 are in a state of rapid change based on information released by regulatory bodies including the CDC and federal and state organizations. These policies and algorithms were followed during the patient's care in the ED.   Final Clinical Impressions(s) / ED Diagnoses  Acute kidney injury Weakness   Carmin Muskrat, MD 10/16/19 1945     Carmin Muskrat, MD 10/16/19 2004

## 2019-10-17 ENCOUNTER — Encounter (HOSPITAL_COMMUNITY): Payer: Self-pay | Admitting: Family Medicine

## 2019-10-17 DIAGNOSIS — J1282 Pneumonia due to coronavirus disease 2019: Secondary | ICD-10-CM | POA: Diagnosis present

## 2019-10-17 DIAGNOSIS — N390 Urinary tract infection, site not specified: Secondary | ICD-10-CM

## 2019-10-17 DIAGNOSIS — U071 COVID-19: Secondary | ICD-10-CM | POA: Diagnosis present

## 2019-10-17 LAB — HIV ANTIBODY (ROUTINE TESTING W REFLEX): HIV Screen 4th Generation wRfx: NONREACTIVE

## 2019-10-17 LAB — COMPREHENSIVE METABOLIC PANEL
ALT: 25 U/L (ref 0–44)
AST: 32 U/L (ref 15–41)
Albumin: 2.5 g/dL — ABNORMAL LOW (ref 3.5–5.0)
Alkaline Phosphatase: 32 U/L — ABNORMAL LOW (ref 38–126)
Anion gap: 16 — ABNORMAL HIGH (ref 5–15)
BUN: 71 mg/dL — ABNORMAL HIGH (ref 8–23)
CO2: 17 mmol/L — ABNORMAL LOW (ref 22–32)
Calcium: 8.6 mg/dL — ABNORMAL LOW (ref 8.9–10.3)
Chloride: 101 mmol/L (ref 98–111)
Creatinine, Ser: 3.82 mg/dL — ABNORMAL HIGH (ref 0.44–1.00)
GFR calc Af Amer: 13 mL/min — ABNORMAL LOW (ref >60)
GFR calc non Af Amer: 11 mL/min — ABNORMAL LOW (ref >60)
Glucose, Bld: 206 mg/dL — ABNORMAL HIGH (ref 70–99)
Potassium: 3.3 mmol/L — ABNORMAL LOW (ref 3.5–5.1)
Sodium: 134 mmol/L — ABNORMAL LOW (ref 135–145)
Total Bilirubin: 0.9 mg/dL (ref 0.3–1.2)
Total Protein: 7.1 g/dL (ref 6.5–8.1)

## 2019-10-17 LAB — URINALYSIS, ROUTINE W REFLEX MICROSCOPIC
Bilirubin Urine: NEGATIVE
Bilirubin Urine: NEGATIVE
Glucose, UA: NEGATIVE mg/dL
Glucose, UA: NEGATIVE mg/dL
Hgb urine dipstick: NEGATIVE
Ketones, ur: NEGATIVE mg/dL
Ketones, ur: NEGATIVE mg/dL
Nitrite: NEGATIVE
Nitrite: NEGATIVE
Protein, ur: 100 mg/dL — AB
Protein, ur: 100 mg/dL — AB
RBC / HPF: >50 RBC/hpf — ABNORMAL HIGH (ref 0–5)
Specific Gravity, Urine: 1.011 (ref 1.005–1.030)
Specific Gravity, Urine: 1.012 (ref 1.005–1.030)
pH: 5 (ref 5.0–8.0)
pH: 5 (ref 5.0–8.0)

## 2019-10-17 LAB — PROTEIN / CREATININE RATIO, URINE
Creatinine, Urine: 93.15 mg/dL
Protein Creatinine Ratio: 0.7 mg/mg{Cre} — ABNORMAL HIGH (ref 0.00–0.15)
Total Protein, Urine: 65 mg/dL

## 2019-10-17 LAB — CBC WITH DIFFERENTIAL/PLATELET
Abs Immature Granulocytes: 0.07 10*3/uL (ref 0.00–0.07)
Basophils Absolute: 0 10*3/uL (ref 0.0–0.1)
Basophils Relative: 0 %
Eosinophils Absolute: 0 10*3/uL (ref 0.0–0.5)
Eosinophils Relative: 0 %
HCT: 28.4 % — ABNORMAL LOW (ref 36.0–46.0)
Hemoglobin: 9.5 g/dL — ABNORMAL LOW (ref 12.0–15.0)
Immature Granulocytes: 1 %
Lymphocytes Relative: 7 %
Lymphs Abs: 0.4 10*3/uL — ABNORMAL LOW (ref 0.7–4.0)
MCH: 32.2 pg (ref 26.0–34.0)
MCHC: 33.5 g/dL (ref 30.0–36.0)
MCV: 96.3 fL (ref 80.0–100.0)
Monocytes Absolute: 0.1 10*3/uL (ref 0.1–1.0)
Monocytes Relative: 2 %
Neutro Abs: 5.1 10*3/uL (ref 1.7–7.7)
Neutrophils Relative %: 90 %
Platelets: 224 10*3/uL (ref 150–400)
RBC: 2.95 MIL/uL — ABNORMAL LOW (ref 3.87–5.11)
RDW: 13.3 % (ref 11.5–15.5)
WBC: 5.7 10*3/uL (ref 4.0–10.5)
nRBC: 0 % (ref 0.0–0.2)

## 2019-10-17 LAB — HEPARIN LEVEL (UNFRACTIONATED): Heparin Unfractionated: <0.1 IU/mL — ABNORMAL LOW (ref 0.30–0.70)

## 2019-10-17 LAB — GLUCOSE, CAPILLARY
Glucose-Capillary: 180 mg/dL — ABNORMAL HIGH (ref 70–99)
Glucose-Capillary: 133 mg/dL — ABNORMAL HIGH (ref 70–99)

## 2019-10-17 LAB — SODIUM, URINE, RANDOM: Sodium, Ur: 27 mmol/L

## 2019-10-17 LAB — OSMOLALITY, URINE: Osmolality, Ur: 361 mOsm/kg (ref 300–900)

## 2019-10-17 LAB — TSH: TSH: 1.914 u[IU]/mL (ref 0.350–4.500)

## 2019-10-17 LAB — SAMPLE TO BLOOD BANK

## 2019-10-17 LAB — C-REACTIVE PROTEIN: CRP: 17.9 mg/dL — ABNORMAL HIGH (ref <1.0)

## 2019-10-17 LAB — D-DIMER, QUANTITATIVE: D-Dimer, Quant: 2.04 ug/mL-FEU — ABNORMAL HIGH (ref 0.00–0.50)

## 2019-10-17 LAB — MAGNESIUM: Magnesium: 2.2 mg/dL (ref 1.7–2.4)

## 2019-10-17 LAB — CK: Total CK: 78 U/L (ref 38–234)

## 2019-10-17 LAB — SARS CORONAVIRUS 2 (TAT 6-24 HRS): SARS Coronavirus 2: POSITIVE — AB

## 2019-10-17 LAB — FERRITIN: Ferritin: 811 ng/mL — ABNORMAL HIGH (ref 11–307)

## 2019-10-17 LAB — BRAIN NATRIURETIC PEPTIDE: B Natriuretic Peptide: 37.6 pg/mL (ref 0.0–100.0)

## 2019-10-17 LAB — PHOSPHORUS: Phosphorus: 4.5 mg/dL (ref 2.5–4.6)

## 2019-10-17 MED ORDER — HEPARIN BOLUS VIA INFUSION
2500.0000 [IU] | Freq: Once | INTRAVENOUS | Status: AC
  Administered 2019-10-18: 2500 [IU] via INTRAVENOUS
  Filled 2019-10-17: qty 2500

## 2019-10-17 MED ORDER — METHYLPREDNISOLONE SODIUM SUCC 125 MG IJ SOLR
60.0000 mg | Freq: Two times a day (BID) | INTRAMUSCULAR | Status: DC
  Administered 2019-10-17 – 2019-10-18 (×3): 60 mg via INTRAVENOUS
  Filled 2019-10-17 (×3): qty 2

## 2019-10-17 MED ORDER — LINAGLIPTIN 5 MG PO TABS: 5.0000 mg | ORAL_TABLET | Freq: Every day | ORAL | Status: DC

## 2019-10-17 MED ORDER — HYDROCOD POLST-CPM POLST ER 10-8 MG/5ML PO SUER
5.0000 mL | Freq: Two times a day (BID) | ORAL | Status: DC | PRN
  Filled 2019-10-17: qty 5

## 2019-10-17 MED ORDER — METHYLPREDNISOLONE SODIUM SUCC 125 MG IJ SOLR
0.5000 mg/kg | Freq: Two times a day (BID) | INTRAMUSCULAR | Status: DC
  Administered 2019-10-17: 56.875 mg via INTRAVENOUS
  Filled 2019-10-17: qty 2

## 2019-10-17 MED ORDER — HEPARIN (PORCINE) 25000 UT/250ML-% IV SOLN
1200.0000 [IU]/h | INTRAVENOUS | Status: DC
  Administered 2019-10-17: 1000 [IU]/h via INTRAVENOUS
  Administered 2019-10-18: 1350 [IU]/h via INTRAVENOUS
  Administered 2019-10-19 – 2019-10-20 (×2): 1200 [IU]/h via INTRAVENOUS
  Filled 2019-10-17 (×5): qty 250

## 2019-10-17 MED ORDER — INSULIN ASPART 100 UNIT/ML ~~LOC~~ SOLN: 0.0000 [IU] | Freq: Every day | SUBCUTANEOUS | Status: DC

## 2019-10-17 MED ORDER — SODIUM CHLORIDE 0.9 % IV SOLN
2.0000 g | INTRAVENOUS | Status: DC
  Administered 2019-10-17: 2 g via INTRAVENOUS
  Filled 2019-10-17: qty 20

## 2019-10-17 MED ORDER — SODIUM CHLORIDE 0.9 % IV SOLN
100.0000 mg | INTRAVENOUS | Status: DC
  Administered 2019-10-18 – 2019-10-20 (×3): 100 mg via INTRAVENOUS
  Filled 2019-10-17 (×3): qty 20
  Filled 2019-10-17: qty 100

## 2019-10-17 MED ORDER — INSULIN ASPART 100 UNIT/ML ~~LOC~~ SOLN
0.0000 [IU] | Freq: Three times a day (TID) | SUBCUTANEOUS | Status: DC
  Administered 2019-10-17: 2 [IU] via SUBCUTANEOUS
  Administered 2019-10-18: 1 [IU] via SUBCUTANEOUS
  Administered 2019-10-18: 2 [IU] via SUBCUTANEOUS
  Administered 2019-10-19: 1 [IU] via SUBCUTANEOUS
  Administered 2019-10-19 – 2019-10-20 (×3): 2 [IU] via SUBCUTANEOUS
  Administered 2019-10-21: 1 [IU] via SUBCUTANEOUS

## 2019-10-17 MED ORDER — SODIUM CHLORIDE 0.9 % IV SOLN
200.0000 mg | Freq: Once | INTRAVENOUS | Status: AC
  Administered 2019-10-17: 200 mg via INTRAVENOUS
  Filled 2019-10-17: qty 40

## 2019-10-17 MED ORDER — GUAIFENESIN-DM 100-10 MG/5ML PO SYRP: 10.0000 mL | ORAL_SOLUTION | ORAL | Status: DC | PRN

## 2019-10-17 MED ORDER — ENOXAPARIN SODIUM 30 MG/0.3ML ~~LOC~~ SOLN: 30.0000 mg | SUBCUTANEOUS | Status: DC

## 2019-10-17 MED ORDER — SODIUM CHLORIDE 0.9 % IV SOLN
INTRAVENOUS | Status: DC
  Administered 2019-10-17 (×2): via INTRAVENOUS

## 2019-10-17 MED ORDER — SODIUM CHLORIDE 0.9 % IV SOLN
INTRAVENOUS | Status: DC
  Administered 2019-10-18 (×2): via INTRAVENOUS

## 2019-10-17 MED ORDER — HEPARIN BOLUS VIA INFUSION
4000.0000 [IU] | Freq: Once | INTRAVENOUS | Status: AC
  Administered 2019-10-17: 4000 [IU] via INTRAVENOUS
  Filled 2019-10-17: qty 4000

## 2019-10-17 NOTE — Progress Notes (Signed)
Patient advised nurse she had already taken her morning dose of verapamil, HCTZ and atenolol. Writer asked patient if medications  were given at [facility] patient states no she took them from her purse. Writer  advised patient of the dangers of taking her own medications without staff/MD knowing.  Writer removed medications from patient purse and can verify atenolol 25 mg and HCTZ  25 mg but no writing on bottle of verapamil. Patient states she was unaware she could not take here on medications.

## 2019-10-17 NOTE — Progress Notes (Signed)
ANTICOAGULATION CONSULT NOTE - Initial Consult  Pharmacy Consult for Heparin Indication: atrial fibrillation  No Known Allergies  Patient Measurements: Height: 5\' 3"  (160 cm) Weight: 250 lb (113.4 kg) IBW/kg (Calculated) : 52.4 Heparin Dosing Weight: 79 kg   Vital Signs: Temp: 98.4 F (36.9 C) (11/11 1045) Temp Source: Oral (11/11 1045) BP: 110/71 (11/11 1200) Pulse Rate: 64 (11/11 1200)  Labs: Recent Labs    10/16/19 1730  HGB 9.5*  HCT 28.8*  PLT 194  APTT 29  LABPROT 13.4  INR 1.0  CREATININE 3.78*    Estimated Creatinine Clearance: 17 mL/min (A) (by C-G formula based on SCr of 3.78 mg/dL (H)).   Medical History: Past Medical History:  Diagnosis Date  . Asthma   . Gout   . Hypertension     Medications:  Medications Prior to Admission  Medication Sig Dispense Refill Last Dose  . albuterol (VENTOLIN HFA) 108 (90 Base) MCG/ACT inhaler Inhale 2 puffs into the lungs every 4 (four) hours as needed for wheezing or shortness of breath.   10/16/2019 at Unknown time  . atenolol (TENORMIN) 25 MG tablet Take 25 mg by mouth daily.   10/16/2019 at 1200  . Fluticasone-Salmeterol (ADVAIR DISKUS) 250-50 MCG/DOSE AEPB Inhale 1 puff into the lungs 2 (two) times daily.    10/16/2019 at Unknown time  . hydrochlorothiazide (HYDRODIURIL) 25 MG tablet Take 25 mg by mouth daily.   10/16/2019 at Unknown time  . verapamil (CALAN) 120 MG tablet Take 120 mg by mouth 3 (three) times daily.   10/16/2019 at Unknown time  . naproxen (NAPROSYN) 500 MG tablet Take 1 tablet (500 mg total) by mouth 2 (two) times daily. (Patient not taking: Reported on 10/16/2019) 20 tablet 0 Not Taking at Unknown time    Assessment: 18 YOF admitted with COVID-19 and associated fever, cough and weakness. Now with new onset Afib. Pharmacy consulted to start IV heparin. CHADSVASc score of 3 (Age, female gender, HTN). She is not on any anticoagulation prior to admission.   H/H low, Plt wnl, SCr 3.78  Goal of  Therapy:  Heparin level 0.3-0.7 units/ml Monitor platelets by anticoagulation protocol: Yes   Plan:  -Heparin 4000 units IV bolus, then start IV heparin infusion at 1000 units/hr  -F/u 8 hr HL -Monitor daily HL, CBC and s/s of bleeding   Albertina Parr, PharmD., BCPS Clinical Pharmacist Clinical phone for 10/17/19 until 5pm: [phone]

## 2019-10-17 NOTE — ED Notes (Addendum)
Report called and given to [place]. [facility] called for transport and they advised it would not be until after AM shift change.

## 2019-10-17 NOTE — ED Notes (Signed)
This nurse has sent 2 messages regarding pts medications being verified in order to be able to pull from pyxis and administer.

## 2019-10-17 NOTE — Progress Notes (Signed)
Spoke with patient's daughter and updated her on patient's status.  Answered all questions and addressed concerns.

## 2019-10-17 NOTE — Progress Notes (Signed)
PROGRESS NOTE    Diamond [name]  LOV:[id number] DOB: 1951/10/23 DOA: 10/16/2019 PCP: [name], MD  Outpatient Specialists:   Brief Narrative:  Patient is a 68 year old African-American female past medical history significant for asthma, gout and hypertension.  Patient presented with fever, cough and weakness that have now resolved in the last 3 weeks.  Patient has intermittent sputum production, said to be greenish.  Associated fatigue.  Work-up done revealed positive COVID-19.  Patient may have tested positive for Covid prior to presentation, however, patient was vague on this history.  The plan is to transfer patient to [facility] for further assessment and management.   Assessment & Plan:   Principal Problem:   CAP (community acquired pneumonia) Active Problems:   Hypertension   Asthma   Gout   AKI (acute kidney injury) ([place])   Hyponatremia   Normocytic anemia   Pneumonia due to COVID-19 virus  Pneumonia secondary to COVID-19/acute respiratory failure:  -COVID-19 test is positive.   -Elevated inflammatory markers (ferritin is 811, CRP of 17.9 taciturn of 0.17, D-dimer of 2.04) -Monitor inflammatory markers daily. -Supplemental oxygen. -IV steroids. -IV remdesivir. -Patient will be transferred to the [facility] for further management -Further management depend on hospital course.  Possible UTI: -UA reveals large leukocyte esterase and many bacteria. -Urine culture -Low threshold to start patient on antibiotics.  Asthma with possible exacerbation: -Continue IV steroids.   -Continue inhalers.   -Consider peak flow daily if possible.   -Further management depend on hospital course.    Hypertension: -Blood pressure ranges from 103-145/89-91 mmHg. -Goal blood pressure is less than 130/80 mmHg. -Discontinue atenolol.   -Continue verapamil 120 mg p.o. daily. -Monitor blood pressure closely and adjust medication accordingly.  Marland Kitchen  History of  gout: -Continue allopurinol. -No flare.  AKI (acute kidney injury) ([place]) -Etiology is unclear -UA reveals protein, blood, RBC and WBC with specific gravity of 1.012. -Renal ultrasound -GN work-up, including infectious GN. -Cannot entirely rule out prerenal -Could be Covid related -Low threshold to consult nephrology team -Further management will depend on hospital course -Keep MAP greater than 65 mmHg. -Avoid nephrotoxic medications. -Dose all medications assuming GFR of less than 10 mils per minute  Hyponatremia: -Urine sodium may not be very helpful considering.   -We will still check urine sodium, urine osmole and serum osmolality.   -Cautious hydration, considering Covid  -Further management depend on hospital course.    Normocytic anemia Check anemia panel. Follow-up H&H.  DVT prophylaxis: Heparin Code Status: Full code Family Communication:  Disposition Plan: Transfer patient to [facility] [employer]:   Low threshold to consult nephrology  Procedures:   None  Antimicrobials/antiviral/Biologics:   IV Rocephin  IV remdesivir   Subjective: No new complaints.  Objective: Vitals:   10/17/19 0700 10/17/19 0730 10/17/19 1000 10/17/19 1045  BP: 115/86 (!) 116/92 (!) 116/92 103/89  Pulse: (!) 57 62 62 (!) 105  Resp: (!) 25 (!) 30 20 (!) 22  Temp:    98.4 F (36.9 C)  TempSrc:    Oral  SpO2: 97% 97% 97% 100%  Weight:      Height:        Intake/Output Summary (Last 24 hours) at 10/17/2019 1138 Last data filed at 10/17/2019 0044 Gross per 24 hour  Intake 1000 ml  Output -  Net 1000 ml   Filed Weights   10/17/19 0125  Weight: 113.4 kg    Examination:  General exam: Appears calm  and comfortable  Respiratory system: Decreased air entry with bibasilar crackles Cardiovascular system: S1 & S2 heard Gastrointestinal system: Abdomen is nondistended, soft and nontender. No organomegaly or masses felt. Normal bowel sounds heard.  Central nervous system: Alert and oriented.  Patient was all extremities. Extremities: No leg edema  Data Reviewed: I have personally reviewed following labs and imaging studies  CBC: Recent Labs  Lab 10/16/19 1730  WBC 7.0  HGB 9.5*  HCT 28.8*  MCV 97.0  PLT 025   Basic Metabolic Panel: Recent Labs  Lab 10/16/19 1730  NA 132*  K 4.2  CL 99  CO2 19*  GLUCOSE 120*  BUN 69*  CREATININE 3.78*  CALCIUM 8.4*  MG 2.2  PHOS 2.6   GFR: Estimated Creatinine Clearance: 17 mL/min (A) (by C-G formula based on SCr of 3.78 mg/dL (H)). Liver Function Tests: Recent Labs  Lab 10/16/19 1730  AST 47*  ALT 24  ALKPHOS 29*  BILITOT 1.0  PROT 7.1  ALBUMIN 2.6*   Recent Labs  Lab 10/16/19 1730  LIPASE 82*   No results for input(s): AMMONIA in the last 168 hours. Coagulation Profile: Recent Labs  Lab 10/16/19 1730  INR 1.0   Cardiac Enzymes: No results for input(s): CKTOTAL, CKMB, CKMBINDEX, TROPONINI in the last 168 hours. BNP (last 3 results) No results for input(s): PROBNP in the last 8760 hours. HbA1C: No results for input(s): HGBA1C in the last 72 hours. CBG: No results for input(s): GLUCAP in the last 168 hours. Lipid Profile: No results for input(s): CHOL, HDL, LDLCALC, TRIG, CHOLHDL, LDLDIRECT in the last 72 hours. Thyroid Function Tests: No results for input(s): TSH, T4TOTAL, FREET4, T3FREE, THYROIDAB in the last 72 hours. Anemia Panel: No results for input(s): VITAMINB12, FOLATE, FERRITIN, TIBC, IRON, RETICCTPCT in the last 72 hours. Urine analysis: No results found for: COLORURINE, APPEARANCEUR, LABSPEC, PHURINE, GLUCOSEU, HGBUR, BILIRUBINUR, KETONESUR, PROTEINUR, UROBILINOGEN, NITRITE, LEUKOCYTESUR Sepsis Labs: @LABRCNTIP (procalcitonin:4,lacticidven:4)  ) Recent Results (from the past 240 hour(s))  SARS CORONAVIRUS 2 (TAT 6-24 HRS) Nasopharyngeal Nasopharyngeal Swab     Status: Abnormal   Collection Time: 10/16/19  5:37 PM   Specimen: Nasopharyngeal  Swab  Result Value Ref Range Status   SARS Coronavirus 2 POSITIVE (A) NEGATIVE Final    Comment: RESULT CALLED TO, READ BACK BY AND VERIFIED WITH: H. COOK,RN [id number] 10/17/2019 T. TYSOR (NOTE) SARS-CoV-2 target nucleic acids are DETECTED. The SARS-CoV-2 RNA is generally detectable in upper and lower respiratory specimens during the acute phase of infection. Positive results are indicative of active infection with SARS-CoV-2. Clinical  correlation with patient history and other diagnostic information is necessary to determine patient infection status. Positive results do  not rule out bacterial infection or co-infection with other viruses. The expected result is Negative. Fact Sheet for Patients: SugarRoll.be Fact Sheet for Healthcare Providers: https://www.woods-mathews.com/ This test is not yet approved or cleared by the [place] FDA and  has been authorized for detection and/or diagnosis of SARS-CoV-2 by FDA under an Emergency Use Authorization (EUA). This EUA will remain  in effect (meaning this test can be used) for t he duration of the COVID-19 declaration under Section 564(b)(1) of the Act, 21 U.S.C. section 360bbb-3(b)(1), unless the authorization is terminated or revoked sooner. Performed at [facility], [place] [address], [place], [place] [address]   Culture, blood (x 2)     Status: None (Preliminary result)   Collection Time: 10/16/19  8:45 PM   Specimen: BLOOD  Result Value Ref Range  Status   Specimen Description   Final    BLOOD LEFT ANTECUBITAL Performed at [facility] [address], [place], [place] [address]    Special Requests   Final    BOTTLES DRAWN AEROBIC AND ANAEROBIC Blood Culture results may not be optimal due to an excessive volume of blood received in culture bottles Performed at [facility] [address] [place], [place] [address]    Culture   Final    NO  GROWTH < 12 HOURS Performed at [facility] [address], [place], [place] [address]    Report Status PENDING  Incomplete  Culture, blood (x 2)     Status: None (Preliminary result)   Collection Time: 10/16/19  8:45 PM   Specimen: BLOOD  Result Value Ref Range Status   Specimen Description   Final    BLOOD BLOOD LEFT HAND Performed at [facility] [address] [place], [place] [address]    Special Requests   Final    BOTTLES DRAWN AEROBIC AND ANAEROBIC Blood Culture results may not be optimal due to an excessive volume of blood received in culture bottles Performed at [facility] [address], [place], [place] [address]    Culture   Final    NO GROWTH < 12 HOURS Performed at [facility] [address], [place],  [address]    Report Status PENDING  Incomplete         Radiology Studies: Dg Chest Port 1 View  Result Date: 10/16/2019 CLINICAL DATA:  Fever EXAM: PORTABLE CHEST 1 VIEW COMPARISON:  Portable exam 1654 hours compared to 10/15/2010 FINDINGS: Borderline enlargement of cardiac silhouette. Mediastinal contours and pulmonary vascularity normal. Peripheral infiltrates in both lungs consistent with pneumonia, including atypical etiologies. Bibasilar atelectasis. No pleural effusion or pneumothorax. Bones unremarkable. IMPRESSION: Bibasilar atelectasis with peripheral infiltrates in both lungs consistent with pneumonia, including atypical etiologies including viral pneumonia. Electronically Signed   By: Lavonia Dana M.D.   On: 10/16/2019 17:00        Scheduled Meds: . atenolol  25 mg Oral Daily  . enoxaparin (LOVENOX) injection  30 mg Subcutaneous Q24H  . methylPREDNISolone (SOLU-MEDROL) injection  0.5 mg/kg Intravenous Q12H  . mometasone-formoterol  2 puff Inhalation BID  . verapamil  120 mg Oral TID   Continuous Infusions: . sodium chloride 75 mL/hr at 10/17/19 0157     LOS: 1 day    Time spent:  35 minutes   Dana Allan, MD  [facility] Pager #: [phone] 7PM-7AM contact night coverage as above

## 2019-10-17 NOTE — Progress Notes (Signed)
Pt was admitted for COVID. She has a positive CXR for LTRI. ALT wnl. She fits the criteria for remdesivir.  Remdesivir 200mg  IV x1 then 100mg  IV q24 x4 F/u ALT  Onnie Boer, PharmD, BCIDP, AAHIVP, CPP Infectious Disease Pharmacist 10/17/2019 4:59 PM

## 2019-10-17 NOTE — Progress Notes (Signed)
[facility] night shift.  The patient COVID-19 test was resulted as positive. Arrangements have been made to admit the patient at [facility].   Tennis Must, MD

## 2019-10-17 NOTE — Progress Notes (Signed)
ANTICOAGULATION CONSULT NOTE   Pharmacy Consult for Heparin Indication: atrial fibrillation  No Known Allergies  Patient Measurements: Height: 5\' 6"  (167.6 cm) Weight: 250 lb (113.4 kg) IBW/kg (Calculated) : 59.3 Heparin Dosing Weight: 79 kg   Vital Signs: Temp: 98.3 F (36.8 C) (11/11 2035) Temp Source: Oral (11/11 2035) BP: 159/93 (11/11 2035) Pulse Rate: 82 (11/11 2035)  Labs: Recent Labs    10/16/19 1730 10/17/19 1420 10/17/19 2205  HGB 9.5* 9.5*  --   HCT 28.8* 28.4*  --   PLT 194 224  --   APTT 29  --   --   LABPROT 13.4  --   --   INR 1.0  --   --   HEPARINUNFRC  --   --  <0.10*  CREATININE 3.78* 3.82*  --   CKTOTAL  --   --  78    Estimated Creatinine Clearance: 17.8 mL/min (A) (by C-G formula based on SCr of 3.82 mg/dL (H)).   Medical History:  Assessment: 46 YOF admitted with COVID-19 and associated fever, cough and weakness. Now with new onset Afib. Pharmacy consulted to start IV heparin. CHADSVASc score of 3 (Age, female gender, HTN). She is not on any anticoagulation prior to admission.   Heparin level undetectable on 1000 units/hr. H/H low but stable, plt wnl. D-dimer 2.04.  Goal of Therapy:  Heparin level 0.3-0.7 units/ml Monitor platelets by anticoagulation protocol: Yes   Plan:  -Rebolus heparin 2500 units IV bolus, then increase IV heparin infusion to 1350 units/hr  -F/u 8 hr HL -Monitor daily HL, CBC and s/s of bleeding   Sherlon Handing, PharmD, BCPS CGV Clinical pharmacist phone [phone] 10/17/2019 11:48 PM

## 2019-10-18 ENCOUNTER — Inpatient Hospital Stay (HOSPITAL_COMMUNITY): Payer: Medicare HMO

## 2019-10-18 DIAGNOSIS — E876 Hypokalemia: Secondary | ICD-10-CM | POA: Diagnosis present

## 2019-10-18 DIAGNOSIS — M1A9XX0 Chronic gout, unspecified, without tophus (tophi): Secondary | ICD-10-CM

## 2019-10-18 DIAGNOSIS — N179 Acute kidney failure, unspecified: Secondary | ICD-10-CM

## 2019-10-18 DIAGNOSIS — I10 Essential (primary) hypertension: Secondary | ICD-10-CM

## 2019-10-18 DIAGNOSIS — E871 Hypo-osmolality and hyponatremia: Secondary | ICD-10-CM

## 2019-10-18 DIAGNOSIS — J45909 Unspecified asthma, uncomplicated: Secondary | ICD-10-CM

## 2019-10-18 DIAGNOSIS — U071 COVID-19: Principal | ICD-10-CM

## 2019-10-18 DIAGNOSIS — D649 Anemia, unspecified: Secondary | ICD-10-CM

## 2019-10-18 DIAGNOSIS — J1289 Other viral pneumonia: Secondary | ICD-10-CM

## 2019-10-18 LAB — C-REACTIVE PROTEIN: CRP: 12.6 mg/dL — ABNORMAL HIGH (ref <1.0)

## 2019-10-18 LAB — COMPREHENSIVE METABOLIC PANEL
ALT: 22 U/L (ref 0–44)
AST: 33 U/L (ref 15–41)
Albumin: 2.6 g/dL — ABNORMAL LOW (ref 3.5–5.0)
Alkaline Phosphatase: 31 U/L — ABNORMAL LOW (ref 38–126)
Anion gap: 16 — ABNORMAL HIGH (ref 5–15)
BUN: 69 mg/dL — ABNORMAL HIGH (ref 8–23)
CO2: 15 mmol/L — ABNORMAL LOW (ref 22–32)
Calcium: 8.5 mg/dL — ABNORMAL LOW (ref 8.9–10.3)
Chloride: 104 mmol/L (ref 98–111)
Creatinine, Ser: 3.13 mg/dL — ABNORMAL HIGH (ref 0.44–1.00)
GFR calc Af Amer: 17 mL/min — ABNORMAL LOW (ref >60)
GFR calc non Af Amer: 14 mL/min — ABNORMAL LOW (ref >60)
Glucose, Bld: 137 mg/dL — ABNORMAL HIGH (ref 70–99)
Potassium: 3.3 mmol/L — ABNORMAL LOW (ref 3.5–5.1)
Sodium: 135 mmol/L (ref 135–145)
Total Bilirubin: 0.6 mg/dL (ref 0.3–1.2)
Total Protein: 6.6 g/dL (ref 6.5–8.1)

## 2019-10-18 LAB — CBC WITH DIFFERENTIAL/PLATELET
Abs Immature Granulocytes: 0.09 10*3/uL — ABNORMAL HIGH (ref 0.00–0.07)
Basophils Absolute: 0 10*3/uL (ref 0.0–0.1)
Basophils Relative: 0 %
Eosinophils Absolute: 0 10*3/uL (ref 0.0–0.5)
Eosinophils Relative: 0 %
HCT: 27.2 % — ABNORMAL LOW (ref 36.0–46.0)
Hemoglobin: 8.8 g/dL — ABNORMAL LOW (ref 12.0–15.0)
Immature Granulocytes: 1 %
Lymphocytes Relative: 8 %
Lymphs Abs: 0.5 10*3/uL — ABNORMAL LOW (ref 0.7–4.0)
MCH: 32.1 pg (ref 26.0–34.0)
MCHC: 32.4 g/dL (ref 30.0–36.0)
MCV: 99.3 fL (ref 80.0–100.0)
Monocytes Absolute: 0.3 10*3/uL (ref 0.1–1.0)
Monocytes Relative: 4 %
Neutro Abs: 6 10*3/uL (ref 1.7–7.7)
Neutrophils Relative %: 87 %
Platelets: 177 10*3/uL (ref 150–400)
RBC: 2.74 MIL/uL — ABNORMAL LOW (ref 3.87–5.11)
RDW: 13.2 % (ref 11.5–15.5)
WBC: 6.9 10*3/uL (ref 4.0–10.5)
nRBC: 0 % (ref 0.0–0.2)

## 2019-10-18 LAB — GLUCOSE, CAPILLARY
Glucose-Capillary: 115 mg/dL — ABNORMAL HIGH (ref 70–99)
Glucose-Capillary: 177 mg/dL — ABNORMAL HIGH (ref 70–99)
Glucose-Capillary: 140 mg/dL — ABNORMAL HIGH (ref 70–99)

## 2019-10-18 LAB — PHOSPHORUS: Phosphorus: 3.7 mg/dL (ref 2.5–4.6)

## 2019-10-18 LAB — MAGNESIUM: Magnesium: 2.1 mg/dL (ref 1.7–2.4)

## 2019-10-18 LAB — D-DIMER, QUANTITATIVE: D-Dimer, Quant: 1.62 ug/mL-FEU — ABNORMAL HIGH (ref 0.00–0.50)

## 2019-10-18 LAB — HEPARIN LEVEL (UNFRACTIONATED)
Heparin Unfractionated: 0.81 IU/mL — ABNORMAL HIGH (ref 0.30–0.70)
Heparin Unfractionated: 0.46 IU/mL (ref 0.30–0.70)

## 2019-10-18 LAB — OSMOLALITY: Osmolality: 313 mOsm/kg — ABNORMAL HIGH (ref 275–295)

## 2019-10-18 LAB — FERRITIN: Ferritin: 707 ng/mL — ABNORMAL HIGH (ref 11–307)

## 2019-10-18 LAB — HEMOGLOBIN A1C
Hgb A1c MFr Bld: 5.4 % (ref 4.8–5.6)
Mean Plasma Glucose: 108.28 mg/dL

## 2019-10-18 MED ORDER — SODIUM CHLORIDE 0.9 % IV SOLN
2.0000 g | INTRAVENOUS | Status: AC
  Administered 2019-10-18: 2 g via INTRAVENOUS
  Filled 2019-10-18: qty 20

## 2019-10-18 MED ORDER — STERILE WATER FOR INJECTION IV SOLN
INTRAVENOUS | Status: DC
  Administered 2019-10-18 – 2019-10-19 (×4): via INTRAVENOUS
  Filled 2019-10-18 (×7): qty 850

## 2019-10-18 MED ORDER — POTASSIUM CHLORIDE CRYS ER 20 MEQ PO TBCR
20.0000 meq | EXTENDED_RELEASE_TABLET | Freq: Once | ORAL | Status: AC
  Administered 2019-10-18: 20 meq via ORAL
  Filled 2019-10-18: qty 1

## 2019-10-18 MED ORDER — ATENOLOL 25 MG PO TABS
25.0000 mg | ORAL_TABLET | Freq: Every day | ORAL | Status: DC
  Administered 2019-10-18: 25 mg via ORAL
  Filled 2019-10-18 (×2): qty 1

## 2019-10-18 NOTE — Progress Notes (Signed)
ANTICOAGULATION CONSULT NOTE   Pharmacy Consult for Heparin Indication: atrial fibrillation  No Known Allergies  Patient Measurements: Height: 5\' 6"  (167.6 cm) Weight: 250 lb (113.4 kg) IBW/kg (Calculated) : 59.3 Heparin Dosing Weight: 79 kg   Vital Signs: Temp: 97.4 F (36.3 C) (11/12 0812) Temp Source: Oral (11/12 0812) BP: 162/92 (11/12 0812) Pulse Rate: 79 (11/12 0812)  Labs: Recent Labs    10/16/19 1730 10/17/19 1420 10/17/19 2205 10/18/19 0120 10/18/19 0745  HGB 9.5* 9.5*  --  8.8*  --   HCT 28.8* 28.4*  --  27.2*  --   PLT 194 224  --  177  --   APTT 29  --   --   --   --   LABPROT 13.4  --   --   --   --   INR 1.0  --   --   --   --   HEPARINUNFRC  --   --  <0.10*  --  0.81*  CREATININE 3.78* 3.82*  --  3.13*  --   CKTOTAL  --   --  78  --   --     Estimated Creatinine Clearance: 21.7 mL/min (A) (by C-G formula based on SCr of 3.13 mg/dL (H)).   Medical History:  Assessment: 70 YOF admitted with COVID-19 and associated fever, cough and weakness. Now with new onset Afib. Pharmacy consulted to start IV heparin. CHADSVASc score of 3 (Age, female gender, HTN). She is not on any anticoagulation prior to admission.   Heparin level 0.81 above goal H/H low, stable. No bleeding reported  Goal of Therapy:  Heparin level 0.3-0.7 units/ml Monitor platelets by anticoagulation protocol: Yes   Plan:  -Decrease heparin infusion to 1200 units/hr -F/u 8 hr HL -Monitor daily HL, CBC and s/s of bleeding   Ulice Dash, PharmD, BCPS Clinical Pharmacist  10/18/2019 9:12 AM

## 2019-10-18 NOTE — Progress Notes (Signed)
At 10/18/19 2200- 24h urine started per order, first void discarded. Pt educated on 24h urine collection specimen process, states understanding.

## 2019-10-18 NOTE — Progress Notes (Addendum)
PROGRESS NOTE  Diamond [name]  OEV:[id number] DOB: January 17, 1951 DOA: 10/16/2019 PCP: [name], MD  Outpatient Specialists: Nephrology, Dr. Marval Regal Brief Narrative: Diamond [name] is a 68 y.o. female with a history of longstanding HTN, well-controlled asthma who presented to the ED 11/10 with fever, cough, and weakness. She had felt unwell for more than a week, beginning about 1 week after attending a church service where she wore a mask inconsistently. Her PCP prescribed steroids about a week PTA but she came to the ED w/progressive symptoms. On evaluation she was febrile with CXR showing peripheral-predominant bilateral infiltrates. WBC 7k, hgb 9.5g/dl, ALT normal, PCT 0.17. Creatinine was elevated at 3.82 with unknown baseline, with pyuria and hematuria on urinalysis. She was admitted for AKI and found subsequently to test positive for covid-19 on 11/11 prompting admission to [facility]. Remdesivir and steroids were started in addition to IV fluids. After admission, the patient developed atrial fibrillation with controlled ventricular rate that converted quickly to NSR with reinitiation of home verapamil and atenolol. Heparin infusion was started due to elevated stroke risk.  Assessment & Plan: Principal Problem:   CAP (community acquired pneumonia) Active Problems:   Hypertension   Asthma   Gout   AKI (acute kidney injury) ([place])   Hyponatremia   Normocytic anemia   Pneumonia due to COVID-19 virus  Covid-19 pneumonia: With no documented hypoxia yet.  - Continue remdesivir, monitoring LFTs. Plan 5 days 11/11 - 11/15 - Continue steroids - Continue airborne, contact precautions. PPE including surgical gown, gloves, cap, shoe covers, and CAPR used during this encounter in a negative pressure room.  - Trend inflammatory markers, showing some improvement, still grossly elevated.  - Maintain euvolemia/net negative.  - Avoid NSAIDs - Recommend proning and aggressive use of incentive  spirometry.  Acute renal failure: Note proteinuria, very possibly due to covid with prerenal element evidenced by modest improvement with IVF.  - Renal U/S - 24hr urine collection. - Checking Osm. FENa suggestive of prerenal at 0.8%. - Continue IVF, with acidosis will change to isotonic bicarbonate.  - Unclear recent baseline, suspect an element of CKD with longstanding HTN and history of nephrology evaluations. D/w Dr. Marval Regal who reviewed clinic records not available from [place], latest Cr in Jan 2020 was 2.14 so this may be a combination of AKI and progression of CKD. Could consult nephrology tmrw if renal function not improving.   Anemia of chronic kidney disease: Most likely explanation is CKD.  - Will need close monitoring to r/o acute blood loss.   Pyuria:  - Started on ceftriaxone, will plan to complete 3 days today. Urine culture pending.   New onset atrial fibrillation: TSH 1.914.  - Rapidly converted to NSR. Will continue telemetry monitoring.  - Continue home verapamil and atenolol. - Deescalate steroids as soon as possible.  - Monitor K, Mg, maintain normal levels.  - CHA2DS2-VASc score is 3 (HTN, age [1], sex), giving heparin IV, plan to convert to [place] pending renal function.   HTN:  - Continuing atenolol and verapamil - Holding HCTZ with AKI/hypokalemia.   Hypokalemia:  - Replace and recheck.   Asthma:  - Continue dulera, prn BDs and steroids as above.  Obesity: BMI 40. Noted.   DVT prophylaxis: IV heparin Code Status: Full Family Communication: None at bedside  Disposition Plan: Home once stable.  Consultants:   Will touch base with nephrology  Procedures:   None  Antimicrobials:  Remdesivir 11/11 - 11/15  Ceftriaxone 11/10 - 11/12  Subjective: Feels well, denies any shortness of breath. When pressed, she does say she gets winded more easily than usual when getting up to BR. No chest pain, leg swelling, palpitations, known hx AFib. No hx  stroke, CAD, PVD. Says she's never had kidney problems but then confirms that she's been followed by nephrology as outpatient but stopped going because they weren't doing anything.  Objective: Vitals:   10/17/19 1706 10/17/19 2035 10/18/19 0300 10/18/19 0812  BP: (!) 145/91 (!) 159/93 (!) 162/92 (!) 162/92  Pulse: 69 82 75 79  Resp: 19 18 18 15   Temp:  98.3 F (36.8 C) 98 F (36.7 C) (!) 97.4 F (36.3 C)  TempSrc:  Oral Oral Oral  SpO2: 100% 100% 100% 95%  Weight:      Height:        Intake/Output Summary (Last 24 hours) at 10/18/2019 0929 Last data filed at 10/18/2019 0300 Gross per 24 hour  Intake 1638.64 ml  Output 1100 ml  Net 538.64 ml   Filed Weights   10/17/19 0125  Weight: 113.4 kg    Gen: 68 y.o. female in no distress Pulm: Non-labored breathing room air. Clear to auscultation bilaterally.  CV: Regular rate and rhythm. No murmur, rub, or gallop. No JVD, no pedal edema. GI: Abdomen soft, non-tender, non-distended, with normoactive bowel sounds. No organomegaly or masses felt. Ext: Warm, no deformities Skin: No rashes, lesions or ulcers Neuro: Alert and oriented. No focal neurological deficits. Psych: Judgement and insight appear normal. Slightly tangential but redirectable. Mood & affect appropriate.   Data Reviewed: I have personally reviewed following labs and imaging studies  CBC: Recent Labs  Lab 10/16/19 1730 10/17/19 1420 10/18/19 0120  WBC 7.0 5.7 6.9  NEUTROABS  --  5.1 6.0  HGB 9.5* 9.5* 8.8*  HCT 28.8* 28.4* 27.2*  MCV 97.0 96.3 99.3  PLT 194 224 503   Basic Metabolic Panel: Recent Labs  Lab 10/16/19 1730 10/17/19 1420 10/18/19 0120  NA 132* 134* 135  K 4.2 3.3* 3.3*  CL 99 101 104  CO2 19* 17* 15*  GLUCOSE 120* 206* 137*  BUN 69* 71* 69*  CREATININE 3.78* 3.82* 3.13*  CALCIUM 8.4* 8.6* 8.5*  MG 2.2 2.2 2.1  PHOS 2.6 4.5 3.7   GFR: Estimated Creatinine Clearance: 21.7 mL/min (A) (by C-G formula based on SCr of 3.13 mg/dL  (H)). Liver Function Tests: Recent Labs  Lab 10/16/19 1730 10/17/19 1420 10/18/19 0120  AST 47* 32 33  ALT 24 25 22   ALKPHOS 29* 32* 31*  BILITOT 1.0 0.9 0.6  PROT 7.1 7.1 6.6  ALBUMIN 2.6* 2.5* 2.6*   Recent Labs  Lab 10/16/19 1730  LIPASE 82*   No results for input(s): AMMONIA in the last 168 hours. Coagulation Profile: Recent Labs  Lab 10/16/19 1730  INR 1.0   Cardiac Enzymes: Recent Labs  Lab 10/17/19 2205  CKTOTAL 78   BNP (last 3 results) No results for input(s): PROBNP in the last 8760 hours. HbA1C: Recent Labs    10/18/19 0120  HGBA1C 5.4   CBG: Recent Labs  Lab 10/17/19 1726 10/17/19 2034  GLUCAP 180* 133*   Lipid Profile: No results for input(s): CHOL, HDL, LDLCALC, TRIG, CHOLHDL, LDLDIRECT in the last 72 hours. Thyroid Function Tests: Recent Labs    10/17/19 1420  TSH 1.914   Anemia Panel: Recent Labs    10/17/19 1420 10/18/19 0120  FERRITIN 811* 707*   Urine analysis:    Component Value Date/Time  COLORURINE YELLOW 10/17/2019 1320   APPEARANCEUR CLOUDY (A) 10/17/2019 1320   LABSPEC 1.012 10/17/2019 1320   PHURINE 5.0 10/17/2019 1320   GLUCOSEU NEGATIVE 10/17/2019 1320   HGBUR MODERATE (A) 10/17/2019 1320   BILIRUBINUR NEGATIVE 10/17/2019 1320   KETONESUR NEGATIVE 10/17/2019 1320   PROTEINUR 100 (A) 10/17/2019 1320   NITRITE NEGATIVE 10/17/2019 1320   LEUKOCYTESUR LARGE (A) 10/17/2019 1320   Recent Results (from the past 240 hour(s))  SARS CORONAVIRUS 2 (TAT 6-24 HRS) Nasopharyngeal Nasopharyngeal Swab     Status: Abnormal   Collection Time: 10/16/19  5:37 PM   Specimen: Nasopharyngeal Swab  Result Value Ref Range Status   SARS Coronavirus 2 POSITIVE (A) NEGATIVE Final    Comment: RESULT CALLED TO, READ BACK BY AND VERIFIED WITH: H. COOK,RN [id number] 10/17/2019 T. TYSOR (NOTE) SARS-CoV-2 target nucleic acids are DETECTED. The SARS-CoV-2 RNA is generally detectable in upper and lower respiratory specimens during the acute  phase of infection. Positive results are indicative of active infection with SARS-CoV-2. Clinical  correlation with patient history and other diagnostic information is necessary to determine patient infection status. Positive results do  not rule out bacterial infection or co-infection with other viruses. The expected result is Negative. Fact Sheet for Patients: SugarRoll.be Fact Sheet for Healthcare Providers: https://www.woods-mathews.com/ This test is not yet approved or cleared by the [place] FDA and  has been authorized for detection and/or diagnosis of SARS-CoV-2 by FDA under an Emergency Use Authorization (EUA). This EUA will remain  in effect (meaning this test can be used) for t he duration of the COVID-19 declaration under Section 564(b)(1) of the Act, 21 U.S.C. section 360bbb-3(b)(1), unless the authorization is terminated or revoked sooner. Performed at [facility], [place] [address] [place], [place] [address]   Culture, blood (x 2)     Status: None (Preliminary result)   Collection Time: 10/16/19  8:45 PM   Specimen: BLOOD  Result Value Ref Range Status   Specimen Description   Final    BLOOD LEFT ANTECUBITAL Performed at [facility] [address], [place], [place] [address]    Special Requests   Final    BOTTLES DRAWN AEROBIC AND ANAEROBIC Blood Culture results may not be optimal due to an excessive volume of blood received in culture bottles Performed at [facility] [address], [place], [place] [address]    Culture   Final    NO GROWTH 2 DAYS Performed at [facility] [address], [place], [place] [address]    Report Status PENDING  Incomplete  Culture, blood (x 2)     Status: None (Preliminary result)   Collection Time: 10/16/19  8:45 PM   Specimen: BLOOD  Result Value Ref Range Status   Specimen Description   Final    BLOOD BLOOD LEFT HAND Performed  at [facility] [address] [place], [place] [address]    Special Requests   Final    BOTTLES DRAWN AEROBIC AND ANAEROBIC Blood Culture results may not be optimal due to an excessive volume of blood received in culture bottles Performed at [facility] [address] [place], [place] [address]    Culture   Final    NO GROWTH 2 DAYS Performed at [facility] [address], [place], [place] [address]    Report Status PENDING  Incomplete      Radiology Studies: [facility] [address]  Result Date: 10/16/2019 CLINICAL DATA:  Fever EXAM: PORTABLE CHEST 1 VIEW COMPARISON:  Portable exam 1654 hours compared to 10/15/2010 FINDINGS: Borderline enlargement of cardiac silhouette. Mediastinal contours and pulmonary vascularity normal. Peripheral infiltrates in both lungs consistent with pneumonia, including atypical etiologies. Bibasilar atelectasis. No pleural effusion or pneumothorax. Bones unremarkable. IMPRESSION: Bibasilar atelectasis with peripheral infiltrates in both lungs consistent with pneumonia, including atypical etiologies including viral pneumonia. Electronically Signed   By: Lavonia Dana M.D.   On: 10/16/2019 17:00    Scheduled Meds:  insulin aspart  0-5 Units Subcutaneous QHS   insulin aspart  0-9 Units Subcutaneous TID WC   methylPREDNISolone (SOLU-MEDROL) injection  60 mg Intravenous Q12H   mometasone-formoterol  2 puff Inhalation BID   potassium chloride  20 mEq Oral Once   verapamil  120 mg Oral TID   Continuous Infusions:  cefTRIAXone (ROCEPHIN)  IV Stopped (10/17/19 2113)   heparin 1,350 Units/hr (10/18/19 0432)   remdesivir 100 mg in NS 250 mL      sodium bicarbonate (isotonic) infusion in sterile water       LOS: 2 days   Time spent: 35 minutes.  Patrecia Pour, MD [facility] [email or web address] 10/18/2019, 9:29 AM

## 2019-10-18 NOTE — Progress Notes (Signed)
ANTICOAGULATION CONSULT NOTE   Pharmacy Consult for Heparin Indication: atrial fibrillation  No Known Allergies  Patient Measurements: Height: 5\' 6"  (167.6 cm) Weight: 250 lb (113.4 kg) IBW/kg (Calculated) : 59.3 Heparin Dosing Weight: 79 kg   Vital Signs: Temp: 97.4 F (36.3 C) (11/12 0812) Temp Source: Oral (11/12 0812) BP: 158/98 (11/12 1709) Pulse Rate: 68 (11/12 1709)  Labs: Recent Labs    10/16/19 1730 10/17/19 1420 10/17/19 2205 10/18/19 0120 10/18/19 0745 10/18/19 1800  HGB 9.5* 9.5*  --  8.8*  --   --   HCT 28.8* 28.4*  --  27.2*  --   --   PLT 194 224  --  177  --   --   APTT 29  --   --   --   --   --   LABPROT 13.4  --   --   --   --   --   INR 1.0  --   --   --   --   --   HEPARINUNFRC  --   --  <0.10*  --  0.81* 0.46  CREATININE 3.78* 3.82*  --  3.13*  --   --   CKTOTAL  --   --  78  --   --   --     Estimated Creatinine Clearance: 21.7 mL/min (A) (by C-G formula based on SCr of 3.13 mg/dL (H)).   Medical History:  Assessment: 65 YOF admitted with COVID-19 and associated fever, cough and weakness. Now with new onset Afib. Pharmacy consulted to start IV heparin. CHADSVASc score of 3 (Age, female gender, HTN). She is not on any anticoagulation prior to admission.   Heparin level came back therapeutic tonight at 0.46. We will repeat level in AM to confirm.   Goal of Therapy:  Heparin level 0.3-0.7 units/ml Monitor platelets by anticoagulation protocol: Yes   Plan:  -Cont heparin infusion to 1200 units/hr -F/u AM HL -Monitor daily HL, CBC and s/s of bleeding    Onnie Boer, PharmD, BCIDP, AAHIVP, CPP Infectious Disease Pharmacist 10/18/2019 7:08 PM

## 2019-10-19 LAB — CBC WITH DIFFERENTIAL/PLATELET
Abs Immature Granulocytes: 0.15 10*3/uL — ABNORMAL HIGH (ref 0.00–0.07)
Basophils Absolute: 0 10*3/uL (ref 0.0–0.1)
Basophils Relative: 0 %
Eosinophils Absolute: 0 10*3/uL (ref 0.0–0.5)
Eosinophils Relative: 0 %
HCT: 25.5 % — ABNORMAL LOW (ref 36.0–46.0)
Hemoglobin: 8.6 g/dL — ABNORMAL LOW (ref 12.0–15.0)
Immature Granulocytes: 1 %
Lymphocytes Relative: 4 %
Lymphs Abs: 0.5 10*3/uL — ABNORMAL LOW (ref 0.7–4.0)
MCH: 31.9 pg (ref 26.0–34.0)
MCHC: 33.7 g/dL (ref 30.0–36.0)
MCV: 94.4 fL (ref 80.0–100.0)
Monocytes Absolute: 0.6 10*3/uL (ref 0.1–1.0)
Monocytes Relative: 5 %
Neutro Abs: 11.7 10*3/uL — ABNORMAL HIGH (ref 1.7–7.7)
Neutrophils Relative %: 90 %
Platelets: 230 10*3/uL (ref 150–400)
RBC: 2.7 MIL/uL — ABNORMAL LOW (ref 3.87–5.11)
RDW: 13.2 % (ref 11.5–15.5)
WBC: 13 10*3/uL — ABNORMAL HIGH (ref 4.0–10.5)
nRBC: 0 % (ref 0.0–0.2)

## 2019-10-19 LAB — GLUCOSE, CAPILLARY
Glucose-Capillary: 137 mg/dL — ABNORMAL HIGH (ref 70–99)
Glucose-Capillary: 115 mg/dL — ABNORMAL HIGH (ref 70–99)
Glucose-Capillary: 161 mg/dL — ABNORMAL HIGH (ref 70–99)
Glucose-Capillary: 126 mg/dL — ABNORMAL HIGH (ref 70–99)
Glucose-Capillary: 156 mg/dL — ABNORMAL HIGH (ref 70–99)

## 2019-10-19 LAB — PROTEIN ELECTROPHORESIS, SERUM
A/G Ratio: 0.6 — ABNORMAL LOW (ref 0.7–1.7)
Albumin ELP: 2.3 g/dL — ABNORMAL LOW (ref 2.9–4.4)
Alpha-1-Globulin: 0.5 g/dL — ABNORMAL HIGH (ref 0.0–0.4)
Alpha-2-Globulin: 1.1 g/dL — ABNORMAL HIGH (ref 0.4–1.0)
Beta Globulin: 0.8 g/dL (ref 0.7–1.3)
Gamma Globulin: 1.2 g/dL (ref 0.4–1.8)
Globulin, Total: 3.6 g/dL (ref 2.2–3.9)
Total Protein ELP: 5.9 g/dL — ABNORMAL LOW (ref 6.0–8.5)

## 2019-10-19 LAB — COMPREHENSIVE METABOLIC PANEL
ALT: 31 U/L (ref 0–44)
AST: 37 U/L (ref 15–41)
Albumin: 2.4 g/dL — ABNORMAL LOW (ref 3.5–5.0)
Alkaline Phosphatase: 38 U/L (ref 38–126)
Anion gap: 17 — ABNORMAL HIGH (ref 5–15)
BUN: 74 mg/dL — ABNORMAL HIGH (ref 8–23)
CO2: 21 mmol/L — ABNORMAL LOW (ref 22–32)
Calcium: 8.5 mg/dL — ABNORMAL LOW (ref 8.9–10.3)
Chloride: 100 mmol/L (ref 98–111)
Creatinine, Ser: 2.86 mg/dL — ABNORMAL HIGH (ref 0.44–1.00)
GFR calc Af Amer: 19 mL/min — ABNORMAL LOW (ref >60)
GFR calc non Af Amer: 16 mL/min — ABNORMAL LOW (ref >60)
Glucose, Bld: 132 mg/dL — ABNORMAL HIGH (ref 70–99)
Potassium: 3 mmol/L — ABNORMAL LOW (ref 3.5–5.1)
Sodium: 138 mmol/L (ref 135–145)
Total Bilirubin: 0.5 mg/dL (ref 0.3–1.2)
Total Protein: 6.4 g/dL — ABNORMAL LOW (ref 6.5–8.1)

## 2019-10-19 LAB — FERRITIN: Ferritin: 609 ng/mL — ABNORMAL HIGH (ref 11–307)

## 2019-10-19 LAB — C3 COMPLEMENT: C3 Complement: 127 mg/dL (ref 82–167)

## 2019-10-19 LAB — ANCA TITERS
Atypical P-ANCA titer: <1:20 {titer}
C-ANCA: <1:20 {titer}
P-ANCA: <1:20 {titer}

## 2019-10-19 LAB — C-REACTIVE PROTEIN: CRP: 8.5 mg/dL — ABNORMAL HIGH (ref <1.0)

## 2019-10-19 LAB — URINE CULTURE: Culture: <10000 — AB

## 2019-10-19 LAB — MAGNESIUM: Magnesium: 1.9 mg/dL (ref 1.7–2.4)

## 2019-10-19 LAB — PHOSPHORUS: Phosphorus: 3.9 mg/dL (ref 2.5–4.6)

## 2019-10-19 LAB — D-DIMER, QUANTITATIVE: D-Dimer, Quant: 1.1 ug/mL-FEU — ABNORMAL HIGH (ref 0.00–0.50)

## 2019-10-19 LAB — HEPARIN LEVEL (UNFRACTIONATED): Heparin Unfractionated: 0.45 IU/mL (ref 0.30–0.70)

## 2019-10-19 LAB — C4 COMPLEMENT: Complement C4, Body Fluid: 32 mg/dL (ref 12–38)

## 2019-10-19 MED ORDER — LOPERAMIDE HCL 2 MG PO CAPS
2.0000 mg | ORAL_CAPSULE | ORAL | Status: DC | PRN
  Administered 2019-10-19 – 2019-10-21 (×2): 2 mg via ORAL
  Filled 2019-10-19 (×3): qty 1

## 2019-10-19 MED ORDER — ATENOLOL 25 MG PO TABS
25.0000 mg | ORAL_TABLET | Freq: Every day | ORAL | Status: DC
  Administered 2019-10-19 – 2019-10-21 (×3): 25 mg via ORAL
  Filled 2019-10-19 (×4): qty 1

## 2019-10-19 MED ORDER — DEXAMETHASONE SODIUM PHOSPHATE 4 MG/ML IJ SOLN
4.0000 mg | Freq: Every day | INTRAMUSCULAR | Status: DC
  Administered 2019-10-20 – 2019-10-21 (×2): 4 mg via INTRAVENOUS
  Filled 2019-10-19 (×3): qty 1

## 2019-10-19 MED ORDER — HYDRALAZINE HCL 50 MG PO TABS
25.0000 mg | ORAL_TABLET | Freq: Three times a day (TID) | ORAL | Status: DC
  Administered 2019-10-19 – 2019-10-20 (×4): 25 mg via ORAL
  Filled 2019-10-19 (×2): qty 1
  Filled 2019-10-19: qty 0.5
  Filled 2019-10-19 (×2): qty 1

## 2019-10-19 MED ORDER — POTASSIUM CHLORIDE CRYS ER 20 MEQ PO TBCR
40.0000 meq | EXTENDED_RELEASE_TABLET | Freq: Once | ORAL | Status: AC
  Administered 2019-10-19: 40 meq via ORAL
  Filled 2019-10-19: qty 2

## 2019-10-19 MED ORDER — ATENOLOL 50 MG PO TABS: 50.0000 mg | ORAL_TABLET | Freq: Every day | ORAL | Status: DC

## 2019-10-19 MED ORDER — DEXAMETHASONE SODIUM PHOSPHATE 10 MG/ML IJ SOLN
6.0000 mg | Freq: Every day | INTRAMUSCULAR | Status: DC
  Administered 2019-10-19: 6 mg via INTRAVENOUS
  Filled 2019-10-19: qty 1

## 2019-10-19 MED ORDER — LIP MEDEX EX OINT
TOPICAL_OINTMENT | CUTANEOUS | Status: DC | PRN
  Filled 2019-10-19: qty 7

## 2019-10-19 NOTE — Progress Notes (Signed)
ANTICOAGULATION CONSULT NOTE   Pharmacy Consult for Heparin Indication: atrial fibrillation  No Known Allergies  Patient Measurements: Height: 5\' 6"  (167.6 cm) Weight: 250 lb (113.4 kg) IBW/kg (Calculated) : 59.3 Heparin Dosing Weight: 79 kg   Vital Signs: Temp: 97.7 F (36.5 C) (11/13 0300) Temp Source: Oral (11/13 0300) BP: 155/104 (11/13 0300) Pulse Rate: 61 (11/13 0300)  Labs: Recent Labs    10/16/19 1730 10/17/19 1420  10/17/19 2205 10/18/19 0120 10/18/19 0745 10/18/19 1800 10/19/19 0130  HGB 9.5* 9.5*  --   --  8.8*  --   --  8.6*  HCT 28.8* 28.4*  --   --  27.2*  --   --  25.5*  PLT 194 224  --   --  177  --   --  230  APTT 29  --   --   --   --   --   --   --   LABPROT 13.4  --   --   --   --   --   --   --   INR 1.0  --   --   --   --   --   --   --   HEPARINUNFRC  --   --    < > <0.10*  --  0.81* 0.46 0.45  CREATININE 3.78* 3.82*  --   --  3.13*  --   --  2.86*  CKTOTAL  --   --   --  78  --   --   --   --    < > = values in this interval not displayed.    Estimated Creatinine Clearance: 23.7 mL/min (A) (by C-G formula based on SCr of 2.86 mg/dL (H)).   Medical History:  Assessment: 60 YOF admitted with COVID-19 and associated fever, cough and weakness. Now with new onset Afib. Pharmacy consulted to start IV heparin. CHADSVASc score of 3 (Age, female gender, HTN). She is not on any anticoagulation prior to admission.   Heparin level therapeutic (0.45) on gtt at 1200 units/hr. No bleeding noted. CBC stable.  Goal of Therapy:  Heparin level 0.3-0.7 units/ml Monitor platelets by anticoagulation protocol: Yes   Plan:  -Continue heparin infusion 1200 units/hr -F/u daily heparin level  Sherlon Handing, PharmD, BCPS CGV Clinical pharmacist phone [phone] 10/19/2019 6:39 AM

## 2019-10-19 NOTE — Progress Notes (Signed)
   10/18/19 2000  Family/Significant Other Communication  Family/Significant Other Update Other (Comment) (declined)  pt states has been in good communication with family "all day", they have been updated with no questions, "not necessary".

## 2019-10-19 NOTE — Progress Notes (Signed)
PROGRESS NOTE  Diamond [name]  ASN:[id number] DOB: 1951/04/26 DOA: 10/16/2019 PCP: [name], MD  Outpatient Specialists: Nephrology, Dr. Marval Regal Brief Narrative: Diamond [name] is a 68 y.o. female with a history of longstanding HTN, well-controlled asthma who presented to the ED 11/10 with fever, cough, and weakness. She had felt unwell for more than a week, beginning about 1 week after attending a church service where she wore a mask inconsistently. Her PCP prescribed steroids about a week PTA but she came to the ED w/progressive symptoms. On evaluation she was febrile with CXR showing peripheral-predominant bilateral infiltrates. WBC 7k, hgb 9.5g/dl, ALT normal, PCT 0.17. Creatinine was elevated at 3.82 with unknown baseline, with pyuria and hematuria on urinalysis. She was admitted for AKI and found subsequently to test positive for covid-19 on 11/11 prompting admission to [facility]. Remdesivir and steroids were started in addition to IV fluids. After admission, the patient developed atrial fibrillation with controlled ventricular rate that converted quickly to NSR with reinitiation of home verapamil and atenolol. Heparin infusion was started due to elevated stroke risk.  Assessment & Plan: Principal Problem:   Pneumonia due to COVID-19 virus Active Problems:   Hypertension   Asthma   Gout   AKI (acute kidney injury) ([place])   Hyponatremia   Anemia of chronic disease   Hypokalemia  Covid-19 pneumonia: With no documented hypoxia yet.  - Continue remdesivir, monitoring LFTs. Plan 5 days 11/11 - 11/15 - Continue steroids, decrease dose given improvement in CRP, no hypoxia. - Continue airborne, contact precautions. PPE including surgical gown, gloves, cap, shoe covers, and CAPR used during this encounter in a negative pressure room.  - Trend inflammatory markers. Improving, remain elevated. - Maintain euvolemia/net negative.  - Avoid NSAIDs - Recommend proning and aggressive use of  incentive spirometry.  AKI on stage IIIa CKD: Based on latest Cr in Jan 2020 of 2.14 per pt's nephrologist, Dr. Marval Regal. Note proteinuria, very possibly due to covid with prerenal element evidenced by modest improvement with IVF. Increased cortical echogenicity without hydronephrosis suggestive of chronic renal disease.  - 24hr urine collection. - FENa suggestive of prerenal at 0.8%. Continue IVF with isotonic bicarbonate. Seems to be improving.  Anemia of chronic kidney disease: Most likely explanation is CKD.  - Will need close monitoring to r/o acute blood loss.   Pyuria: Insignificant growth on urine culture. Ceftriaxone stopped after 3 doses with this culture result.  New onset atrial fibrillation: TSH 1.914.  - Rapidly converted to NSR. Will continue telemetry monitoring.  - Continue home verapamil and atenolol. - Decrease steroid dose starting tomorrow as above.  - Monitor K, Mg, maintain normal levels.  - CHA2DS2-VASc score is 3 (HTN, age [1], sex), giving heparin IV, plan to convert to [place] pending renal function improvement.  HTN:  - Continuing atenolol and verapamil. Add hydralazine to TID verapamil given persistent BP elevation and desire to continue avoiding HCTZ. - Holding HCTZ with AKI/hypokalemia.   Hypokalemia:  - Replaced and recheck.   Asthma:  - Continue dulera, prn BDs and steroids as above.  Obesity: BMI 40. Noted.   DVT prophylaxis: IV heparin Code Status: Full Family Communication: None at bedside. Declines offers to call. Disposition Plan: Home once stable, renal function improved, and covid treated.  Consultants:   Will touch base with nephrology  Procedures:   None  Antimicrobials:  Remdesivir 11/11 - 11/15  Ceftriaxone 11/10 - 11/12   Subjective: Has no shortness of breath or pain. Urinating a lot.  Objective: Vitals:   10/18/19 0812 10/18/19 1709 10/18/19 2000 10/19/19 0300  BP: (!) 162/92 (!) 158/98 (!) 140/92 (!) 155/104   Pulse: 79 68 72 61  Resp: 15 19 18 17   Temp: (!) 97.4 F (36.3 C)  97.6 F (36.4 C) 97.7 F (36.5 C)  TempSrc: Oral  Oral Oral  SpO2: 95% 99% 98% 97%  Weight:      Height:        Intake/Output Summary (Last 24 hours) at 10/19/2019 1406 Last data filed at 10/19/2019 0600 Gross per 24 hour  Intake 2150.29 ml  Output 700 ml  Net 1450.29 ml   Filed Weights   10/17/19 0125  Weight: 113.4 kg   Gen: 68 y.o. female in no distress Pulm: Nonlabored breathing room air. Clear. CV: Regular rate and rhythm. No murmur, rub, or gallop. No JVD, no dependent edema. GI: Abdomen soft, non-tender, non-distended, with normoactive bowel sounds.  Ext: Warm, no deformities Skin: No rashes, lesions or ulcers on visualized skin. Neuro: Alert and oriented. No focal neurological deficits. Psych: Judgement and insight appear fair. Mood euthymic & affect congruent. Behavior is appropriate.    Data Reviewed: I have personally reviewed following labs and imaging studies  CBC: Recent Labs  Lab 10/16/19 1730 10/17/19 1420 10/18/19 0120 10/19/19 0130  WBC 7.0 5.7 6.9 13.0*  NEUTROABS  --  5.1 6.0 11.7*  HGB 9.5* 9.5* 8.8* 8.6*  HCT 28.8* 28.4* 27.2* 25.5*  MCV 97.0 96.3 99.3 94.4  PLT 194 224 177 157   Basic Metabolic Panel: Recent Labs  Lab 10/16/19 1730 10/17/19 1420 10/18/19 0120 10/19/19 0130  NA 132* 134* 135 138  K 4.2 3.3* 3.3* 3.0*  CL 99 101 104 100  CO2 19* 17* 15* 21*  GLUCOSE 120* 206* 137* 132*  BUN 69* 71* 69* 74*  CREATININE 3.78* 3.82* 3.13* 2.86*  CALCIUM 8.4* 8.6* 8.5* 8.5*  MG 2.2 2.2 2.1 1.9  PHOS 2.6 4.5 3.7 3.9   GFR: Estimated Creatinine Clearance: 23.7 mL/min (A) (by C-G formula based on SCr of 2.86 mg/dL (H)). Liver Function Tests: Recent Labs  Lab 10/16/19 1730 10/17/19 1420 10/18/19 0120 10/19/19 0130  AST 47* 32 33 37  ALT 24 25 22 31   ALKPHOS 29* 32* 31* 38  BILITOT 1.0 0.9 0.6 0.5  PROT 7.1 7.1 6.6 6.4*  ALBUMIN 2.6* 2.5* 2.6* 2.4*   Recent  Labs  Lab 10/16/19 1730  LIPASE 82*   No results for input(s): AMMONIA in the last 168 hours. Coagulation Profile: Recent Labs  Lab 10/16/19 1730  INR 1.0   Cardiac Enzymes: Recent Labs  Lab 10/17/19 2205  CKTOTAL 78   BNP (last 3 results) No results for input(s): PROBNP in the last 8760 hours. HbA1C: Recent Labs    10/18/19 0120  HGBA1C 5.4   CBG: Recent Labs  Lab 10/18/19 1157 10/18/19 1635 10/18/19 2125 10/19/19 0817 10/19/19 1106  GLUCAP 177* 137* 140* 115* 161*   Lipid Profile: No results for input(s): CHOL, HDL, LDLCALC, TRIG, CHOLHDL, LDLDIRECT in the last 72 hours. Thyroid Function Tests: Recent Labs    10/17/19 1420  TSH 1.914   Anemia Panel: Recent Labs    10/18/19 0120 10/19/19 0130  FERRITIN 707* 609*   Urine analysis:    Component Value Date/Time   COLORURINE YELLOW 10/17/2019 1320   APPEARANCEUR CLOUDY (A) 10/17/2019 1320   LABSPEC 1.012 10/17/2019 1320   PHURINE 5.0 10/17/2019 1320   GLUCOSEU NEGATIVE 10/17/2019 1320   HGBUR  MODERATE (A) 10/17/2019 1320   BILIRUBINUR NEGATIVE 10/17/2019 1320   KETONESUR NEGATIVE 10/17/2019 1320   PROTEINUR 100 (A) 10/17/2019 1320   NITRITE NEGATIVE 10/17/2019 1320   LEUKOCYTESUR LARGE (A) 10/17/2019 1320   Recent Results (from the past 240 hour(s))  SARS CORONAVIRUS 2 (TAT 6-24 HRS) Nasopharyngeal Nasopharyngeal Swab     Status: Abnormal   Collection Time: 10/16/19  5:37 PM   Specimen: Nasopharyngeal Swab  Result Value Ref Range Status   SARS Coronavirus 2 POSITIVE (A) NEGATIVE Final    Comment: RESULT CALLED TO, READ BACK BY AND VERIFIED WITH: H. COOK,RN [id number] 10/17/2019 T. TYSOR (NOTE) SARS-CoV-2 target nucleic acids are DETECTED. The SARS-CoV-2 RNA is generally detectable in upper and lower respiratory specimens during the acute phase of infection. Positive results are indicative of active infection with SARS-CoV-2. Clinical  correlation with patient history and other diagnostic  information is necessary to determine patient infection status. Positive results do  not rule out bacterial infection or co-infection with other viruses. The expected result is Negative. Fact Sheet for Patients: SugarRoll.be Fact Sheet for Healthcare Providers: https://www.woods-mathews.com/ This test is not yet approved or cleared by the [place] FDA and  has been authorized for detection and/or diagnosis of SARS-CoV-2 by FDA under an Emergency Use Authorization (EUA). This EUA will remain  in effect (meaning this test can be used) for t he duration of the COVID-19 declaration under Section 564(b)(1) of the Act, 21 U.S.C. section 360bbb-3(b)(1), unless the authorization is terminated or revoked sooner. Performed at [facility], [place] [address], [place], [place] [address]   Culture, blood (x 2)     Status: None (Preliminary result)   Collection Time: 10/16/19  8:45 PM   Specimen: BLOOD  Result Value Ref Range Status   Specimen Description   Final    BLOOD LEFT ANTECUBITAL Performed at [facility] [address] [place], [place] [address]    Special Requests   Final    BOTTLES DRAWN AEROBIC AND ANAEROBIC Blood Culture results may not be optimal due to an excessive volume of blood received in culture bottles Performed at [facility] [address], [place], [place] [address]    Culture   Final    NO GROWTH 3 DAYS Performed at [facility], [place] [address] [place], [place] [address]    Report Status PENDING  Incomplete  Culture, blood (x 2)     Status: None (Preliminary result)   Collection Time: 10/16/19  8:45 PM   Specimen: BLOOD  Result Value Ref Range Status   Specimen Description   Final    BLOOD BLOOD LEFT HAND Performed at [place] [address], [place], [place] [address]    Special Requests   Final    BOTTLES DRAWN AEROBIC AND ANAEROBIC Blood  Culture results may not be optimal due to an excessive volume of blood received in culture bottles Performed at [facility] [address] [place], [place] [address]    Culture   Final    NO GROWTH 3 DAYS Performed at [facility], [place] [address] [place], [place] [address]    Report Status PENDING  Incomplete  Culture, Urine     Status: Abnormal   Collection Time: 10/17/19  6:13 PM   Specimen: Urine, Clean Catch  Result Value Ref Range Status   Specimen Description   Final    URINE, CLEAN CATCH Performed at [facility]  [place] [address], [place], [place] [address]    Special Requests   Final    NONE Performed at [facility], [place] [address], [place], [place] [address]    Culture (A)  Final    <10,000 COLONIES/mL INSIGNIFICANT GROWTH Performed at [facility] [place] [address] [place], [place] [address]    Report Status 10/19/2019 FINAL  Final      Radiology Studies: US Renal  Result Date: 10/18/2019 CLINICAL DATA:  Acute renal disease. EXAM: RENAL / URINARY TRACT ULTRASOUND COMPLETE COMPARISON:  None. FINDINGS: Right Kidney: Renal measurements: 9.6 x 3.9 x 4.8 cm = volume: 92 mL. Contains a 10 mm cyst. Increased cortical echogenicity. Left Kidney: Renal measurements: 10.6 x 5.1 x 5.4 cm = volume: 152.4 mL. Contains a 1.7 cm cyst. Increased cortical echogenicity. Bladder: Not well evaluated due to lack of distention. Other: None. IMPRESSION: 1. Increased cortical echogenicity is seen in both kidneys consistent with medical renal disease. Bilateral renal cysts are noted. Electronically Signed   By: Dorise Bullion III M.D   On: 10/18/2019 16:25    Scheduled Meds: . atenolol  25 mg Oral Daily  . dexamethasone (DECADRON) injection  6 mg Intravenous Daily  . hydrALAZINE  25 mg Oral TID  . insulin aspart  0-5 Units Subcutaneous QHS  . insulin aspart  0-9 Units Subcutaneous TID WC  . mometasone-formoterol  2 puff  Inhalation BID  . verapamil  120 mg Oral TID   Continuous Infusions: . heparin 1,200 Units/hr (10/19/19 0147)  . remdesivir 100 mg in NS 250 mL 100 mg (10/19/19 0926)  .  sodium bicarbonate (isotonic) infusion in sterile water 100 mL/hr at 10/19/19 0925     LOS: 3 days   Time spent: 35 minutes.  Patrecia Pour, MD [facility] [email or web address] 10/19/2019, 2:06 PM

## 2019-10-20 LAB — D-DIMER, QUANTITATIVE: D-Dimer, Quant: 0.97 ug/mL-FEU — ABNORMAL HIGH (ref 0.00–0.50)

## 2019-10-20 LAB — CBC WITH DIFFERENTIAL/PLATELET
Abs Immature Granulocytes: 0.22 10*3/uL — ABNORMAL HIGH (ref 0.00–0.07)
Basophils Absolute: 0 10*3/uL (ref 0.0–0.1)
Basophils Relative: 0 %
Eosinophils Absolute: 0 10*3/uL (ref 0.0–0.5)
Eosinophils Relative: 0 %
HCT: 24.9 % — ABNORMAL LOW (ref 36.0–46.0)
Hemoglobin: 8.5 g/dL — ABNORMAL LOW (ref 12.0–15.0)
Immature Granulocytes: 2 %
Lymphocytes Relative: 6 %
Lymphs Abs: 0.7 10*3/uL (ref 0.7–4.0)
MCH: 31.8 pg (ref 26.0–34.0)
MCHC: 34.1 g/dL (ref 30.0–36.0)
MCV: 93.3 fL (ref 80.0–100.0)
Monocytes Absolute: 0.6 10*3/uL (ref 0.1–1.0)
Monocytes Relative: 6 %
Neutro Abs: 9.6 10*3/uL — ABNORMAL HIGH (ref 1.7–7.7)
Neutrophils Relative %: 86 %
Platelets: 224 10*3/uL (ref 150–400)
RBC: 2.67 MIL/uL — ABNORMAL LOW (ref 3.87–5.11)
RDW: 12.9 % (ref 11.5–15.5)
WBC: 11.1 10*3/uL — ABNORMAL HIGH (ref 4.0–10.5)
nRBC: 0 % (ref 0.0–0.2)

## 2019-10-20 LAB — GLUCOSE, CAPILLARY
Glucose-Capillary: 112 mg/dL — ABNORMAL HIGH (ref 70–99)
Glucose-Capillary: 183 mg/dL — ABNORMAL HIGH (ref 70–99)
Glucose-Capillary: 167 mg/dL — ABNORMAL HIGH (ref 70–99)
Glucose-Capillary: 150 mg/dL — ABNORMAL HIGH (ref 70–99)

## 2019-10-20 LAB — COMPREHENSIVE METABOLIC PANEL
ALT: 36 U/L (ref 0–44)
AST: 36 U/L (ref 15–41)
Albumin: 2.4 g/dL — ABNORMAL LOW (ref 3.5–5.0)
Alkaline Phosphatase: 39 U/L (ref 38–126)
Anion gap: 15 (ref 5–15)
BUN: 75 mg/dL — ABNORMAL HIGH (ref 8–23)
CO2: 25 mmol/L (ref 22–32)
Calcium: 8.4 mg/dL — ABNORMAL LOW (ref 8.9–10.3)
Chloride: 100 mmol/L (ref 98–111)
Creatinine, Ser: 2.5 mg/dL — ABNORMAL HIGH (ref 0.44–1.00)
GFR calc Af Amer: 22 mL/min — ABNORMAL LOW (ref >60)
GFR calc non Af Amer: 19 mL/min — ABNORMAL LOW (ref >60)
Glucose, Bld: 134 mg/dL — ABNORMAL HIGH (ref 70–99)
Potassium: 3.3 mmol/L — ABNORMAL LOW (ref 3.5–5.1)
Sodium: 140 mmol/L (ref 135–145)
Total Bilirubin: 0.5 mg/dL (ref 0.3–1.2)
Total Protein: 6 g/dL — ABNORMAL LOW (ref 6.5–8.1)

## 2019-10-20 LAB — HEPARIN LEVEL (UNFRACTIONATED): Heparin Unfractionated: 0.46 IU/mL (ref 0.30–0.70)

## 2019-10-20 LAB — FERRITIN: Ferritin: 626 ng/mL — ABNORMAL HIGH (ref 11–307)

## 2019-10-20 LAB — PHOSPHORUS: Phosphorus: 4.4 mg/dL (ref 2.5–4.6)

## 2019-10-20 LAB — MAGNESIUM: Magnesium: 1.8 mg/dL (ref 1.7–2.4)

## 2019-10-20 LAB — C-REACTIVE PROTEIN: CRP: 5.4 mg/dL — ABNORMAL HIGH (ref <1.0)

## 2019-10-20 MED ORDER — MAGNESIUM SULFATE 2 GM/50ML IV SOLN
2.0000 g | Freq: Once | INTRAVENOUS | Status: AC
  Administered 2019-10-20: 2 g via INTRAVENOUS
  Filled 2019-10-20: qty 50

## 2019-10-20 MED ORDER — APIXABAN 5 MG PO TABS
5.0000 mg | ORAL_TABLET | Freq: Two times a day (BID) | ORAL | Status: DC
  Administered 2019-10-20 – 2019-10-21 (×3): 5 mg via ORAL
  Filled 2019-10-20 (×4): qty 1

## 2019-10-20 MED ORDER — SODIUM CHLORIDE 0.45 % IV SOLN
INTRAVENOUS | Status: DC
  Administered 2019-10-20 (×2): via INTRAVENOUS
  Filled 2019-10-20 (×5): qty 1000

## 2019-10-20 MED ORDER — HYDRALAZINE HCL 50 MG PO TABS
25.0000 mg | ORAL_TABLET | Freq: Once | ORAL | Status: AC
  Administered 2019-10-20: 25 mg via ORAL
  Filled 2019-10-20: qty 1

## 2019-10-20 MED ORDER — HYDRALAZINE HCL 50 MG PO TABS
50.0000 mg | ORAL_TABLET | Freq: Three times a day (TID) | ORAL | Status: DC
  Administered 2019-10-20 (×2): 50 mg via ORAL
  Filled 2019-10-20 (×2): qty 1

## 2019-10-20 NOTE — Progress Notes (Addendum)
PROGRESS NOTE  Diamond [name]  OTL:[id number] DOB: 1950/12/20 DOA: 10/16/2019 PCP: [name], MD  Outpatient Specialists: Nephrology, Dr. Marval Regal Brief Narrative: Diamond [name] is a 68 y.o. female with a history of longstanding HTN, well-controlled asthma who presented to the ED 11/10 with fever, cough, and weakness. She had felt unwell for more than a week, beginning about 1 week after attending a church service where she wore a mask inconsistently. Her PCP prescribed steroids about a week PTA but she came to the ED w/progressive symptoms. On evaluation she was febrile with CXR showing peripheral-predominant bilateral infiltrates. WBC 7k, hgb 9.5g/dl, ALT normal, PCT 0.17. Creatinine was elevated at 3.82 with unknown baseline, with pyuria and hematuria on urinalysis. She was admitted for AKI and found subsequently to test positive for covid-19 on 11/11 prompting admission to [facility]. Remdesivir and steroids were started in addition to IV fluids. After admission, the patient developed atrial fibrillation with controlled ventricular rate that converted quickly to NSR with reinitiation of home verapamil and atenolol. Heparin infusion was started due to elevated stroke risk.  Assessment & Plan: Principal Problem:   Pneumonia due to COVID-19 virus Active Problems:   Hypertension   Asthma   Gout   AKI (acute kidney injury) ([place])   Hyponatremia   Anemia of chronic disease   Hypokalemia  Covid-19 pneumonia: With no documented hypoxia yet.  - Continue remdesivir, monitoring LFTs. Plan 5 days 11/11 - 11/15 - Continue steroids, decreased dose given improvement in CRP down to 5.4 today, no hypoxia. Will continue decadron 4mg  as CRP remains elevated. - Continue airborne, contact precautions. PPE including surgical gown, gloves, cap, shoe covers, and CAPR used during this encounter in a negative pressure room.  - Trend inflammatory markers. Improving, remain elevated. - Maintain euvolemia/net  negative.  - Avoid NSAIDs - Recommend proning and aggressive use of incentive spirometry.  AKI on stage IIIa CKD: Based on latest Cr in Jan 2020 of 2.14 per pt's nephrologist, Dr. Marval Regal. Note proteinuria, very possibly due to covid with prerenal element evidenced by modest improvement with IVF. Increased cortical echogenicity without hydronephrosis suggestive of chronic renal disease.  - Continue IVF, change from bicarb (acidemia resolved) to maintenance fluids. - FENa suggestive of prerenal at 0.8%. Continue IVF with isotonic bicarbonate. Seems to be improving.  Anemia of chronic kidney disease: Most likely explanation is CKD.  - Will need close monitoring to r/o acute blood loss.   Pyuria: Insignificant growth on urine culture. Ceftriaxone stopped after 3 doses with this culture result.  New onset atrial fibrillation: TSH 1.914.  - Rapidly converted to NSR. Will continue telemetry monitoring.  - Continue home verapamil and atenolol. - Decrease steroid dose starting today - Monitor K, Mg, maintain normal levels.  - CHA2DS2-VASc score is 3 (HTN, age [1], sex), giving heparin IV, well convert to DOAC  HTN:  - Continuing atenolol and verapamil. Added hydralazine to TID verapamil given persistent BP elevation and desire to continue avoiding HCTZ. Will increase hydralazine dose today. - Holding HCTZ with AKI/hypokalemia.   Hypokalemia:  - Replace in maintenance IVF and recheck. Will also give magnesium due to AFib and hypokalemia.  Asthma:  - Continue dulera, prn BDs and steroids as above.  Obesity: BMI 40. Noted.   DVT prophylaxis: IV heparin > eliquis Code Status: Full Family Communication: None at bedside. Declined offers to call. Disposition Plan: Home once stable, renal function improved, and covid treated. Possibly 11/15  Consultants:   Will touch base with nephrology  Procedures:   None  Antimicrobials:  Remdesivir 11/11 - 11/15  Ceftriaxone 11/10 - 11/12    Subjective: Feeling well, eating well, no dyspnea or chest pain or other complaints. Excited to possible go home tomorrow. Urinating a lot.   Objective: Vitals:   10/19/19 1638 10/19/19 1941 10/20/19 0528 10/20/19 0814  BP: (!) 137/93 138/89 (!) 148/99 (!) 150/96  Pulse: 70  63 65  Resp: 16  18 18   Temp:  98.3 F (36.8 C) 97.7 F (36.5 C) 97.9 F (36.6 C)  TempSrc:  Oral Oral Oral  SpO2: 99%  97% 95%  Weight:      Height:        Intake/Output Summary (Last 24 hours) at 10/20/2019 1019 Last data filed at 10/19/2019 2200 Gross per 24 hour  Intake 1693.63 ml  Output -  Net 1693.63 ml   Filed Weights   10/17/19 0125  Weight: 113.4 kg   Gen: 68 y.o. female in no distress Pulm: Nonlabored breathing room air. Clear. CV: Regular rate and rhythm. No murmur, rub, or gallop. No JVD, no pitting dependent edema. GI: Abdomen soft, non-tender, non-distended, with normoactive bowel sounds.  Ext: Warm, no deformities Skin: No new rashes, lesions or ulcers on visualized skin. Neuro: Alert and oriented. No focal neurological deficits. Psych: Judgement and insight appear fair. Mood euthymic & affect congruent. Behavior is appropriate.    Data Reviewed: I have personally reviewed following labs and imaging studies  CBC: Recent Labs  Lab 10/16/19 1730 10/17/19 1420 10/18/19 0120 10/19/19 0130 10/20/19 0202  WBC 7.0 5.7 6.9 13.0* 11.1*  NEUTROABS  --  5.1 6.0 11.7* 9.6*  HGB 9.5* 9.5* 8.8* 8.6* 8.5*  HCT 28.8* 28.4* 27.2* 25.5* 24.9*  MCV 97.0 96.3 99.3 94.4 93.3  PLT 194 224 177 230 062   Basic Metabolic Panel: Recent Labs  Lab 10/16/19 1730 10/17/19 1420 10/18/19 0120 10/19/19 0130 10/20/19 0202  NA 132* 134* 135 138 140  K 4.2 3.3* 3.3* 3.0* 3.3*  CL 99 101 104 100 100  CO2 19* 17* 15* 21* 25  GLUCOSE 120* 206* 137* 132* 134*  BUN 69* 71* 69* 74* 75*  CREATININE 3.78* 3.82* 3.13* 2.86* 2.50*  CALCIUM 8.4* 8.6* 8.5* 8.5* 8.4*  MG 2.2 2.2 2.1 1.9 1.8  PHOS 2.6 4.5  3.7 3.9 4.4   GFR: Estimated Creatinine Clearance: 27.1 mL/min (A) (by C-G formula based on SCr of 2.5 mg/dL (H)). Liver Function Tests: Recent Labs  Lab 10/16/19 1730 10/17/19 1420 10/18/19 0120 10/19/19 0130 10/20/19 0202  AST 47* 32 33 37 36  ALT 24 25 22 31  36  ALKPHOS 29* 32* 31* 38 39  BILITOT 1.0 0.9 0.6 0.5 0.5  PROT 7.1 7.1 6.6 6.4* 6.0*  ALBUMIN 2.6* 2.5* 2.6* 2.4* 2.4*   Recent Labs  Lab 10/16/19 1730  LIPASE 82*   No results for input(s): AMMONIA in the last 168 hours. Coagulation Profile: Recent Labs  Lab 10/16/19 1730  INR 1.0   Cardiac Enzymes: Recent Labs  Lab 10/17/19 2205  CKTOTAL 78   BNP (last 3 results) No results for input(s): PROBNP in the last 8760 hours. HbA1C: Recent Labs    10/18/19 0120  HGBA1C 5.4   CBG: Recent Labs  Lab 10/19/19 0817 10/19/19 1106 10/19/19 1709 10/19/19 2004 10/20/19 0718  GLUCAP 115* 161* 126* 156* 112*   Lipid Profile: No results for input(s): CHOL, HDL, LDLCALC, TRIG, CHOLHDL, LDLDIRECT in the last 72 hours. Thyroid Function Tests: Recent Labs  10/17/19 1420  TSH 1.914   Anemia Panel: Recent Labs    10/19/19 0130 10/20/19 0202  FERRITIN 609* 626*   Urine analysis:    Component Value Date/Time   COLORURINE YELLOW 10/17/2019 1320   APPEARANCEUR CLOUDY (A) 10/17/2019 1320   LABSPEC 1.012 10/17/2019 1320   PHURINE 5.0 10/17/2019 1320   GLUCOSEU NEGATIVE 10/17/2019 1320   HGBUR MODERATE (A) 10/17/2019 1320   BILIRUBINUR NEGATIVE 10/17/2019 1320   KETONESUR NEGATIVE 10/17/2019 1320   PROTEINUR 100 (A) 10/17/2019 1320   NITRITE NEGATIVE 10/17/2019 1320   LEUKOCYTESUR LARGE (A) 10/17/2019 1320   Recent Results (from the past 240 hour(s))  SARS CORONAVIRUS 2 (TAT 6-24 HRS) Nasopharyngeal Nasopharyngeal Swab     Status: Abnormal   Collection Time: 10/16/19  5:37 PM   Specimen: Nasopharyngeal Swab  Result Value Ref Range Status   SARS Coronavirus 2 POSITIVE (A) NEGATIVE Final     Comment: RESULT CALLED TO, READ BACK BY AND VERIFIED WITH: H. COOK,RN [id number] 10/17/2019 T. TYSOR (NOTE) SARS-CoV-2 target nucleic acids are DETECTED. The SARS-CoV-2 RNA is generally detectable in upper and lower respiratory specimens during the acute phase of infection. Positive results are indicative of active infection with SARS-CoV-2. Clinical  correlation with patient history and other diagnostic information is necessary to determine patient infection status. Positive results do  not rule out bacterial infection or co-infection with other viruses. The expected result is Negative. Fact Sheet for Patients: SugarRoll.be Fact Sheet for Healthcare Providers: https://www.woods-mathews.com/ This test is not yet approved or cleared by the [place] FDA and  has been authorized for detection and/or diagnosis of SARS-CoV-2 by FDA under an Emergency Use Authorization (EUA). This EUA will remain  in effect (meaning this test can be used) for t he duration of the COVID-19 declaration under Section 564(b)(1) of the Act, 21 U.S.C. section 360bbb-3(b)(1), unless the authorization is terminated or revoked sooner. Performed at [facility], [place] [address], [place], [place] [address]   Culture, blood (x 2)     Status: None (Preliminary result)   Collection Time: 10/16/19  8:45 PM   Specimen: BLOOD  Result Value Ref Range Status   Specimen Description   Final    BLOOD LEFT ANTECUBITAL Performed at [facility] [address], [place], [place] [address]    Special Requests   Final    BOTTLES DRAWN AEROBIC AND ANAEROBIC Blood Culture results may not be optimal due to an excessive volume of blood received in culture bottles Performed at [facility] [place] [address] [place], [place] [address]    Culture   Final    NO GROWTH 3 DAYS Performed at [facility], [place] [address], [place], [place] [address]     Report Status PENDING  Incomplete  Culture, blood (x 2)     Status: None (Preliminary result)   Collection Time: 10/16/19  8:45 PM   Specimen: BLOOD  Result Value Ref Range Status   Specimen Description   Final    BLOOD BLOOD LEFT HAND Performed at [place] [address], [place], [place] [address]    Special Requests   Final    BOTTLES DRAWN AEROBIC AND ANAEROBIC Blood Culture results may not be optimal due to an excessive volume of blood received in culture bottles Performed at [facility] [address], [place], [place] [address]    Culture   Final    NO GROWTH 3 DAYS  Performed at [facility], [place] [address] [place], [place] [address]    Report Status PENDING  Incomplete  Culture, Urine     Status: Abnormal   Collection Time: 10/17/19  6:13 PM   Specimen: Urine, Clean Catch  Result Value Ref Range Status   Specimen Description   Final    URINE, CLEAN CATCH Performed at [facility], [place] [address], [place], [place] [address]    Special Requests   Final    NONE Performed at [facility], [place] [address], [place], [place] [address]    Culture (A)  Final    <10,000 COLONIES/mL INSIGNIFICANT GROWTH Performed at [facility] [address] [place], [place] [address]    Report Status 10/19/2019 FINAL  Final      Radiology Studies: US Renal  Result Date: 10/18/2019 CLINICAL DATA:  Acute renal disease. EXAM: RENAL / URINARY TRACT ULTRASOUND COMPLETE COMPARISON:  None. FINDINGS: Right Kidney: Renal measurements: 9.6 x 3.9 x 4.8 cm = volume: 92 mL. Contains a 10 mm cyst. Increased cortical echogenicity. Left Kidney: Renal measurements: 10.6 x 5.1 x 5.4 cm = volume: 152.4 mL. Contains a 1.7 cm cyst. Increased cortical echogenicity. Bladder: Not well evaluated due to lack of distention. Other: None. IMPRESSION: 1. Increased cortical echogenicity is seen in both kidneys consistent with  medical renal disease. Bilateral renal cysts are noted. Electronically Signed   By: Dorise Bullion III M.D   On: 10/18/2019 16:25    Scheduled Meds: . atenolol  25 mg Oral Daily  . dexamethasone (DECADRON) injection  4 mg Intravenous Daily  . hydrALAZINE  25 mg Oral TID  . insulin aspart  0-5 Units Subcutaneous QHS  . insulin aspart  0-9 Units Subcutaneous TID WC  . mometasone-formoterol  2 puff Inhalation BID  . verapamil  120 mg Oral TID   Continuous Infusions: . heparin 1,200 Units/hr (10/20/19 0225)  . remdesivir 100 mg in NS 250 mL 100 mg (10/20/19 0843)  . sodium chloride 0.45 % with kcl       LOS: 4 days   Time spent: 35 minutes.  Patrecia Pour, MD [facility] [email or web address] 10/20/2019, 10:19 AM

## 2019-10-20 NOTE — Progress Notes (Signed)
ANTICOAGULATION CONSULT NOTE   Pharmacy Consult for Heparin Indication: atrial fibrillation  No Known Allergies  Patient Measurements: Height: 5\' 6"  (167.6 cm) Weight: 250 lb (113.4 kg) IBW/kg (Calculated) : 59.3 Heparin Dosing Weight: 79 kg   Vital Signs: Temp: 97.7 F (36.5 C) (11/14 0528) Temp Source: Oral (11/14 0528) BP: 148/99 (11/14 0528) Pulse Rate: 63 (11/14 0528)  Labs: Recent Labs    10/17/19 2205 10/18/19 0120  10/18/19 1800 10/19/19 0130 10/20/19 0202  HGB  --  8.8*  --   --  8.6* 8.5*  HCT  --  27.2*  --   --  25.5* 24.9*  PLT  --  177  --   --  230 224  HEPARINUNFRC <0.10*  --    < > 0.46 0.45 0.46  CREATININE  --  3.13*  --   --  2.86* 2.50*  CKTOTAL 78  --   --   --   --   --    < > = values in this interval not displayed.    Estimated Creatinine Clearance: 27.1 mL/min (A) (by C-G formula based on SCr of 2.5 mg/dL (H)).   Medical History:  Assessment: 31 YOF admitted with COVID-19 and associated fever, cough and weakness. Now with new onset Afib. Pharmacy consulted to start IV heparin. CHADSVASc score of 3 (Age, female gender, HTN). She is not on any anticoagulation prior to admission.   Heparin level therapeutic (0.46) on gtt at 1200 units/hr. No bleeding noted. CBC stable.  Goal of Therapy:  Heparin level 0.3-0.7 units/ml Monitor platelets by anticoagulation protocol: Yes   Plan:  -Continue heparin infusion 1200 units/hr -F/u daily heparin level  Sherlon Handing, PharmD, BCPS CG[facility] Clinical pharmacist phone [phone] 10/20/2019 6:35 AM

## 2019-10-20 NOTE — Progress Notes (Signed)
ANTICOAGULATION CONSULT NOTE   Pharmacy Consult for Heparin to apixaban Indication: atrial fibrillation  No Known Allergies  Patient Measurements: Height: 5\' 6"  (167.6 cm) Weight: 250 lb (113.4 kg) IBW/kg (Calculated) : 59.3 Heparin Dosing Weight: 79 kg   Vital Signs: Temp: 97.9 F (36.6 C) (11/14 0814) Temp Source: Oral (11/14 0814) BP: 150/96 (11/14 0814) Pulse Rate: 65 (11/14 0814)  Labs: Recent Labs    10/17/19 2205 10/18/19 0120  10/18/19 1800 10/19/19 0130 10/20/19 0202  HGB  --  8.8*  --   --  8.6* 8.5*  HCT  --  27.2*  --   --  25.5* 24.9*  PLT  --  177  --   --  230 224  HEPARINUNFRC <0.10*  --    < > 0.46 0.45 0.46  CREATININE  --  3.13*  --   --  2.86* 2.50*  CKTOTAL 78  --   --   --   --   --    < > = values in this interval not displayed.    Estimated Creatinine Clearance: 27.1 mL/min (A) (by C-G formula based on SCr of 2.5 mg/dL (H)).    Assessment: Diamond [name] admitted with COVID-19 and associated fever, cough and weakness. Now with new onset Afib. Pharmacy consulted to start IV heparin. CHADSVASc score of 3 (Age, female gender, HTN). She is not on any anticoagulation prior to admission.   Heparin is currently therapeutic, per MD now to transition to apixaban. Age and wt ok for full dose, Cr 2.5 but is trending down.  Goal of Therapy:  Heparin level 0.3-0.7 units/ml Monitor platelets by anticoagulation protocol: Yes   Plan:  -Stop IV heparin -Begin apixaban 5mg  BID  Arrie Senate, PharmD, BCPS Clinical Pharmacist Please check AMION for all [facility] Pharmacy numbers 10/20/2019

## 2019-10-21 DIAGNOSIS — E876 Hypokalemia: Secondary | ICD-10-CM

## 2019-10-21 DIAGNOSIS — D638 Anemia in other chronic diseases classified elsewhere: Secondary | ICD-10-CM

## 2019-10-21 LAB — CBC WITH DIFFERENTIAL/PLATELET
Abs Immature Granulocytes: 0.21 10*3/uL — ABNORMAL HIGH (ref 0.00–0.07)
Basophils Absolute: 0 10*3/uL (ref 0.0–0.1)
Basophils Relative: 0 %
Eosinophils Absolute: 0 10*3/uL (ref 0.0–0.5)
Eosinophils Relative: 0 %
HCT: 25.4 % — ABNORMAL LOW (ref 36.0–46.0)
Hemoglobin: 8.4 g/dL — ABNORMAL LOW (ref 12.0–15.0)
Immature Granulocytes: 2 %
Lymphocytes Relative: 8 %
Lymphs Abs: 0.8 10*3/uL (ref 0.7–4.0)
MCH: 31.5 pg (ref 26.0–34.0)
MCHC: 33.1 g/dL (ref 30.0–36.0)
MCV: 95.1 fL (ref 80.0–100.0)
Monocytes Absolute: 0.8 10*3/uL (ref 0.1–1.0)
Monocytes Relative: 8 %
Neutro Abs: 8.4 10*3/uL — ABNORMAL HIGH (ref 1.7–7.7)
Neutrophils Relative %: 82 %
Platelets: 229 10*3/uL (ref 150–400)
RBC: 2.67 MIL/uL — ABNORMAL LOW (ref 3.87–5.11)
RDW: 13.2 % (ref 11.5–15.5)
WBC: 10.3 10*3/uL (ref 4.0–10.5)
nRBC: 0.5 % — ABNORMAL HIGH (ref 0.0–0.2)

## 2019-10-21 LAB — PHOSPHORUS: Phosphorus: 4.9 mg/dL — ABNORMAL HIGH (ref 2.5–4.6)

## 2019-10-21 LAB — COMPREHENSIVE METABOLIC PANEL
ALT: 37 U/L (ref 0–44)
AST: 33 U/L (ref 15–41)
Albumin: 2.4 g/dL — ABNORMAL LOW (ref 3.5–5.0)
Alkaline Phosphatase: 37 U/L — ABNORMAL LOW (ref 38–126)
Anion gap: 14 (ref 5–15)
BUN: 69 mg/dL — ABNORMAL HIGH (ref 8–23)
CO2: 24 mmol/L (ref 22–32)
Calcium: 8.8 mg/dL — ABNORMAL LOW (ref 8.9–10.3)
Chloride: 100 mmol/L (ref 98–111)
Creatinine, Ser: 2.37 mg/dL — ABNORMAL HIGH (ref 0.44–1.00)
GFR calc Af Amer: 23 mL/min — ABNORMAL LOW (ref >60)
GFR calc non Af Amer: 20 mL/min — ABNORMAL LOW (ref >60)
Glucose, Bld: 109 mg/dL — ABNORMAL HIGH (ref 70–99)
Potassium: 3.7 mmol/L (ref 3.5–5.1)
Sodium: 138 mmol/L (ref 135–145)
Total Bilirubin: 0.8 mg/dL (ref 0.3–1.2)
Total Protein: 5.7 g/dL — ABNORMAL LOW (ref 6.5–8.1)

## 2019-10-21 LAB — CULTURE, BLOOD (ROUTINE X 2)
Culture: NO GROWTH
Culture: NO GROWTH

## 2019-10-21 LAB — MAGNESIUM: Magnesium: 2.3 mg/dL (ref 1.7–2.4)

## 2019-10-21 LAB — GLUCOSE, CAPILLARY
Glucose-Capillary: 86 mg/dL (ref 70–99)
Glucose-Capillary: 136 mg/dL — ABNORMAL HIGH (ref 70–99)

## 2019-10-21 LAB — D-DIMER, QUANTITATIVE: D-Dimer, Quant: 0.87 ug/mL-FEU — ABNORMAL HIGH (ref 0.00–0.50)

## 2019-10-21 LAB — FERRITIN: Ferritin: 572 ng/mL — ABNORMAL HIGH (ref 11–307)

## 2019-10-21 LAB — C-REACTIVE PROTEIN: CRP: 3.9 mg/dL — ABNORMAL HIGH (ref <1.0)

## 2019-10-21 MED ORDER — DEXAMETHASONE 4 MG PO TABS: 4.0000 mg | ORAL_TABLET | Freq: Every day | ORAL | 0 refills | Status: AC

## 2019-10-21 MED ORDER — APIXABAN 5 MG PO TABS: 5.0000 mg | ORAL_TABLET | Freq: Two times a day (BID) | ORAL | 0 refills | Status: DC

## 2019-10-21 NOTE — Care Management (Addendum)
Pt to discharge home today.  CM was unable to reach pt via phone (no answer on both room phone and cell phone).  Pt will discharge home on Eliquis.  CM is unable to obtain benefit check per weekend.  CM attempted to call free 30 day Elquis benefit into preferred pharmacy however pharmacy declined to take benefit over the phone.  CM printed benefit form to unit Network engineer.  Secretary confirmed receipt of form and will provide form directly to pt prior to discharge.  CM informed covering RN of above information.  CM also sent secure chat to pts assigned bedside nurse informing of above information.  CM requested via AVS for pt to follow up with PCP if ongoing copay for Eliquis causes hardship.

## 2019-10-21 NOTE — Discharge Summary (Signed)
Physician Discharge Summary  Diamond [name]:[id number] DOB: December 14, 1950 DOA: 10/16/2019  PCP: [name], MD  Admit date: 10/16/2019 Discharge date: 10/21/2019  Admitted From: Home Disposition: Home   Recommendations for Outpatient Follow-up:  1. Follow up with PCP in 1-2 weeks 2. Please obtain BMP/CBC in one week 3. Consider cardiology follow up for atrial fibrillation, started on eliquis 4. Consider ongoing nephrology evaluation  Home Health: None Equipment/Devices: None Discharge Condition: Stable CODE STATUS: Full Diet recommendation: Heart healthy  Brief/Interim Summary: Diamond [name] is a 68 y.o. female with a history of longstanding HTN, well-controlled asthma who presented to the ED 11/10 with fever, cough, and weakness. She had felt unwell for more than a week, beginning about 1 week after attending a church service where she wore a mask inconsistently. Her PCP prescribed steroids about a week PTA but she came to the ED w/progressive symptoms. On evaluation she was febrile with CXR showing peripheral-predominant bilateral infiltrates. WBC 7k, hgb 9.5g/dl, ALT normal, PCT 0.17. Creatinine was elevated at 3.82 with unknown baseline, with pyuria and hematuria on urinalysis. She was admitted for AKI and found subsequently to test positive for covid-19 on 11/11 prompting admission to [facility]. Remdesivir and steroids were started in addition to IV fluids. After admission, the patient developed atrial fibrillation with controlled ventricular rate that converted quickly to NSR with reinitiation of home verapamil and atenolol. Heparin infusion was started due to elevated stroke risk and later transitioned to eliquis. Respiratory status has stabilized and renal function has continued improvement. The patient feels well and has completed 5 days of remdesivir, and remains functionally independent for return to home.  Discharge Diagnoses:  Principal Problem:   Pneumonia due to COVID-19  virus Active Problems:   Hypertension   Asthma   Gout   AKI (acute kidney injury) ([place])   Hyponatremia   Anemia of chronic disease   Hypokalemia  Covid-19 pneumonia: With no documented hypoxia yet.  - Completed remdesivir 11/11 - 11/15 - Continue steroid to complete 10 day course. CRP down significantly and to avoid provocation of a-fib, will continue lower dose decadron at 4mg .  - Isolation for 21 days from first positive test recommended, d/w patient. - Avoid NSAIDs - Recommend continued use of incentive spirometry.  AKI on stage IIIa CKD: Based on latest Cr in Jan 2020 of 2.14 per pt's nephrologist, Dr. Marval Regal. Note proteinuria, likely due to covid and prerenal. Increased cortical echogenicity without hydronephrosis suggestive of chronic renal disease. FENa suggestive of prerenal at 0.8%. - Continue outpatient follow up and control risk factors, namely BP.  Anemia of chronic kidney disease:   - Will need close monitoring to r/o acute blood loss.   Pyuria: Insignificant growth on urine culture. Ceftriaxone stopped after 3 doses with this culture result.  New onset atrial fibrillation: TSH 1.914.  - Rapidly converted to NSR, noted on ECG 11/11. - Continue home verapamil and atenolol. - Monitor K, Mg, maintain normal levels.  - CHA2DS2-VASc score is 3 (HTN, age [1], sex), gave IV heparin and converted to DOAC  HTN:  - Continuing atenolol and verapamil. Added hydralazine to TID verapamil given persistent BP elevation and desire to continue avoiding HCTZ. Ok to restart HCTZ at discharge and not hydralazine.  Hypokalemia:  - Replaced and now resolved. Mg is 2.3.   Asthma:  - Continue dulera, prn BDs and steroids as above.  Obesity: BMI 40. Noted.   Discharge Instructions Discharge Instructions    Diet - low sodium heart  healthy   Complete by: As directed    Discharge instructions   Complete by: As directed    You are being discharged from the hospital after  treatment for covid-19 infection. You are felt to be stable enough to no longer require inpatient monitoring, testing, and treatment, though you will need to follow the recommendations below: - Remain in self isolation for 21 days from the date of your positive test (11/10). - Continue taking decadron (steroid) for 4 more days - Continue taking atenolol and verapamil which will reduce your risk of reverting to atrial fibrillation (the irregular heart rhythm you were briefly in earlier in the hospitalization). Because you experienced the irregular heart rhythm, you are at higher risk of stroke, so you will need to take a blood thinner to reduce that risk. This is eliquis, and has been sent to your pharmacy. Discuss this with your doctor at follow up.  - You should also follow up with your nephrologist sometime in the next month or so. - Remain in self-isolation for 14 days following discharge to reduce risk of transmission of the virus.  - Do not take NSAID medications (including, but not limited to, ibuprofen, advil, motrin, naproxen, aleve, goody's powder, etc.) - Follow up with your doctor in the next week via telehealth or seek medical attention right away if your symptoms get WORSE.  - Consider donating plasma after you have recovered (either 14 days after a negative test or 28 days after symptoms have completely resolved) because your antibodies to this virus may be helpful to give to others with life-threatening infections. Please go to the website www.[email or web address] if you would like to consider volunteering for plasma donation.    Directions for you at home:  Wear a facemask You should wear a facemask that covers your nose and mouth when you are in the same room with other people and when you visit a healthcare provider. People who live with or visit you should also wear a facemask while they are in the same room with you.  Separate yourself from other people in your home As much as possible,  you should stay in a different room from other people in your home. Also, you should use a separate bathroom, if available.  Avoid sharing household items You should not share dishes, drinking glasses, cups, eating utensils, towels, bedding, or other items with other people in your home. After using these items, you should wash them thoroughly with soap and water.  Cover your coughs and sneezes Cover your mouth and nose with a tissue when you cough or sneeze, or you can cough or sneeze into your sleeve. Throw used tissues in a lined trash can, and immediately wash your hands with soap and water for at least 20 seconds or use an alcohol-based hand rub.  Wash your [facility] your hands often and thoroughly with soap and water for at least 20 seconds. You can use an alcohol-based hand sanitizer if soap and water are not available and if your hands are not visibly dirty. Avoid touching your eyes, nose, and mouth with unwashed hands.  Directions for those who live with, or provide care at home for you:  Limit the number of people who have contact with the patient If possible, have only one caregiver for the patient. Other household members should stay in another home or place of residence. If this is not possible, they should stay in another room, or be separated from the patient as much as  possible. Use a separate bathroom, if available. Restrict visitors who do not have an essential need to be in the home.  Ensure good ventilation Make sure that shared spaces in the home have good air flow, such as from an air conditioner or an opened window, weather permitting.  Wash your hands often Wash your hands often and thoroughly with soap and water for at least 20 seconds. You can use an alcohol based hand sanitizer if soap and water are not available and if your hands are not visibly dirty. Avoid touching your eyes, nose, and mouth with unwashed hands. Use disposable paper towels to dry your  hands. If not available, use dedicated cloth towels and replace them when they become wet.  Wear a facemask and gloves Wear a disposable facemask at all times in the room and gloves when you touch or have contact with the patient's blood, body fluids, and/or secretions or excretions, such as sweat, saliva, sputum, nasal mucus, vomit, urine, or feces.  Ensure the mask fits over your nose and mouth tightly, and do not touch it during use. Throw out disposable facemasks and gloves after using them. Do not reuse. Wash your hands immediately after removing your facemask and gloves. If your personal clothing becomes contaminated, carefully remove clothing and launder. Wash your hands after handling contaminated clothing. Place all used disposable facemasks, gloves, and other waste in a lined container before disposing them with other household waste. Remove gloves and wash your hands immediately after handling these items.  Do not share dishes, glasses, or other household items with the patient Avoid sharing household items. You should not share dishes, drinking glasses, cups, eating utensils, towels, bedding, or other items with a patient who is confirmed to have, or being evaluated for, COVID-19 infection. After the person uses these items, you should wash them thoroughly with soap and water.  Wash laundry thoroughly Immediately remove and wash clothes or bedding that have blood, body fluids, and/or secretions or excretions, such as sweat, saliva, sputum, nasal mucus, vomit, urine, or feces, on them. Wear gloves when handling laundry from the patient. Read and follow directions on labels of laundry or clothing items and detergent. In general, wash and dry with the warmest temperatures recommended on the label.  Clean all areas the individual has used often Clean all touchable surfaces, such as counters, tabletops, doorknobs, bathroom fixtures, toilets, phones, keyboards, tablets, and bedside tables,  every day. Also, clean any surfaces that may have blood, body fluids, and/or secretions or excretions on them. Wear gloves when cleaning surfaces the patient has come in contact with. Use a diluted bleach solution (e.g., dilute bleach with 1 part bleach and 10 parts water) or a household disinfectant with a label that says EPA-registered for coronaviruses. To make a bleach solution at home, add 1 tablespoon of bleach to 1 quart (4 cups) of water. For a larger supply, add  cup of bleach to 1 gallon (16 cups) of water. Read labels of cleaning products and follow recommendations provided on product labels. Labels contain instructions for safe and effective use of the cleaning product including precautions you should take when applying the product, such as wearing gloves or eye protection and making sure you have good ventilation during use of the product. Remove gloves and wash hands immediately after cleaning.  Monitor yourself for signs and symptoms of illness Caregivers and household members are considered close contacts, should monitor their health, and will be asked to limit movement outside of the home  to the extent possible. Follow the monitoring steps for close contacts listed on the symptom monitoring form.  If you have additional questions, contact your local health department or call the epidemiologist on call at [phone] (available 24/7). This guidance is subject to change. For the most up-to-date guidance from [facility], please refer to their website: [email or web address]   Increase activity slowly   Complete by: As directed    MyChart COVID-19 home monitoring program   Complete by: Oct 21, 2019    Is the patient willing to use the [facility] for home monitoring?: Yes     Allergies as of 10/21/2019   No Known Allergies     Medication List    STOP taking these medications   naproxen 500 MG tablet Commonly known as:  NAPROSYN     TAKE these medications   Advair Diskus 250-50 MCG/DOSE Aepb Generic drug: Fluticasone-Salmeterol Inhale 1 puff into the lungs 2 (two) times daily.   albuterol 108 (90 Base) MCG/ACT inhaler Commonly known as: VENTOLIN HFA Inhale 2 puffs into the lungs every 4 (four) hours as needed for wheezing or shortness of breath.   apixaban 5 MG Tabs tablet Commonly known as: ELIQUIS Take 1 tablet (5 mg total) by mouth 2 (two) times daily.   atenolol 25 MG tablet Commonly known as: TENORMIN Take 25 mg by mouth daily.   dexamethasone 4 MG tablet Commonly known as: Decadron Take 1 tablet (4 mg total) by mouth daily for 4 days.   hydrochlorothiazide 25 MG tablet Commonly known as: HYDRODIURIL Take 25 mg by mouth daily.   verapamil 120 MG tablet Commonly known as: CALAN Take 120 mg by mouth 3 (three) times daily.      Follow-up Information    Osei-Bonsu, Iona Beard, MD. Schedule an appointment as soon as possible for a visit in 1 week(s).   Specialty: Internal Medicine Contact information: [address] [place] [address] [phone]        Donato Heinz, MD Follow up.   Specialty: Nephrology Contact information: [place] [address] [phone]          No Known Allergies  Consultations:  Nephrology by phone only, Dr. Marval Regal  Procedures/Studies: US Renal  Result Date: 10/18/2019 CLINICAL DATA:  Acute renal disease. EXAM: RENAL / URINARY TRACT ULTRASOUND COMPLETE COMPARISON:  None. FINDINGS: Right Kidney: Renal measurements: 9.6 x 3.9 x 4.8 cm = volume: 92 mL. Contains a 10 mm cyst. Increased cortical echogenicity. Left Kidney: Renal measurements: 10.6 x 5.1 x 5.4 cm = volume: 152.4 mL. Contains a 1.7 cm cyst. Increased cortical echogenicity. Bladder: Not well evaluated due to lack of distention. Other: None. IMPRESSION: 1. Increased cortical echogenicity is seen in both kidneys consistent with medical renal disease.  Bilateral renal cysts are noted. Electronically Signed   By: Dorise Bullion III M.D   On: 10/18/2019 16:25   Dg Chest Port 1 View  Result Date: 10/16/2019 CLINICAL DATA:  Fever EXAM: PORTABLE CHEST 1 VIEW COMPARISON:  Portable exam 1654 hours compared to 10/15/2010 FINDINGS: Borderline enlargement of cardiac silhouette. Mediastinal contours and pulmonary vascularity normal. Peripheral infiltrates in both lungs consistent with pneumonia, including atypical etiologies. Bibasilar atelectasis. No pleural effusion or pneumothorax. Bones unremarkable. IMPRESSION: Bibasilar atelectasis with peripheral infiltrates in both lungs consistent with pneumonia, including atypical etiologies including viral pneumonia. Electronically Signed   By: Lavonia Dana M.D.   On: 10/16/2019 17:00    Subjective: Feels well, ready to go home. No  dyspnea, chest pain, leg swelling. Eating ok. No bleeding reported.  Discharge Exam: Vitals:   10/21/19 0604 10/21/19 0730  BP:  (!) 167/94  Pulse: (!) 59 61  Resp: 12 14  Temp:  97.9 F (36.6 C)  SpO2: 99% 99%   General: Pt is alert, awake, not in acute distress Cardiovascular: RRR, S1/S2 +, no rubs, no gallops Respiratory: CTA bilaterally, no wheezing, no rhonchi Abdominal: Soft, NT, ND, bowel sounds + Extremities: No edema, no cyanosis  Labs: BNP (last 3 results) Recent Labs    10/16/19 1730  BNP 63.1   Basic Metabolic Panel: Recent Labs  Lab 10/17/19 1420 10/18/19 0120 10/19/19 0130 10/20/19 0202 10/21/19 0221  NA 134* 135 138 140 138  K 3.3* 3.3* 3.0* 3.3* 3.7  CL 101 104 100 100 100  CO2 17* 15* 21* 25 24  GLUCOSE 206* 137* 132* 134* 109*  BUN 71* 69* 74* 75* 69*  CREATININE 3.82* 3.13* 2.86* 2.50* 2.37*  CALCIUM 8.6* 8.5* 8.5* 8.4* 8.8*  MG 2.2 2.1 1.9 1.8 2.3  PHOS 4.5 3.7 3.9 4.4 4.9*   Liver Function Tests: Recent Labs  Lab 10/17/19 1420 10/18/19 0120 10/19/19 0130 10/20/19 0202 10/21/19 0221  AST 32 33 37 36 33  ALT 25 22 31  36 37   ALKPHOS 32* 31* 38 39 37*  BILITOT 0.9 0.6 0.5 0.5 0.8  PROT 7.1 6.6 6.4* 6.0* 5.7*  ALBUMIN 2.5* 2.6* 2.4* 2.4* 2.4*   Recent Labs  Lab 10/16/19 1730  LIPASE 82*   No results for input(s): AMMONIA in the last 168 hours. CBC: Recent Labs  Lab 10/17/19 1420 10/18/19 0120 10/19/19 0130 10/20/19 0202 10/21/19 0221  WBC 5.7 6.9 13.0* 11.1* 10.3  NEUTROABS 5.1 6.0 11.7* 9.6* 8.4*  HGB 9.5* 8.8* 8.6* 8.5* 8.4*  HCT 28.4* 27.2* 25.5* 24.9* 25.4*  MCV 96.3 99.3 94.4 93.3 95.1  PLT 224 177 230 224 229   Cardiac Enzymes: Recent Labs  Lab 10/17/19 2205  CKTOTAL 78   BNP: Invalid input(s): POCBNP CBG: Recent Labs  Lab 10/20/19 0718 10/20/19 1122 10/20/19 1645 10/20/19 2103 10/21/19 0738  GLUCAP 112* 183* 167* 150* 86   D-Dimer Recent Labs    10/20/19 0202 10/21/19 0221  DDIMER 0.97* 0.87*   Hgb A1c No results for input(s): HGBA1C in the last 72 hours. Lipid Profile No results for input(s): CHOL, HDL, LDLCALC, TRIG, CHOLHDL, LDLDIRECT in the last 72 hours. Thyroid function studies No results for input(s): TSH, T4TOTAL, T3FREE, THYROIDAB in the last 72 hours.  Invalid input(s): FREET3 Anemia work up Recent Labs    10/20/19 0202 10/21/19 0221  FERRITIN 626* 572*   Urinalysis    Component Value Date/Time   COLORURINE YELLOW 10/17/2019 1320   APPEARANCEUR CLOUDY (A) 10/17/2019 1320   LABSPEC 1.012 10/17/2019 1320   PHURINE 5.0 10/17/2019 1320   GLUCOSEU NEGATIVE 10/17/2019 1320   HGBUR MODERATE (A) 10/17/2019 1320   BILIRUBINUR NEGATIVE 10/17/2019 1320   KETONESUR NEGATIVE 10/17/2019 1320   PROTEINUR 100 (A) 10/17/2019 1320   NITRITE NEGATIVE 10/17/2019 1320   LEUKOCYTESUR LARGE (A) 10/17/2019 1320    Microbiology Recent Results (from the past 240 hour(s))  SARS CORONAVIRUS 2 (TAT 6-24 HRS) Nasopharyngeal Nasopharyngeal Swab     Status: Abnormal   Collection Time: 10/16/19  5:37 PM   Specimen: Nasopharyngeal Swab  Result Value Ref Range Status    SARS Coronavirus 2 POSITIVE (A) NEGATIVE Final    Comment: RESULT CALLED TO, READ BACK BY AND  VERIFIED WITH: H. COOK,RN 0938 10/17/2019 T. TYSOR (NOTE) SARS-CoV-2 target nucleic acids are DETECTED. The SARS-CoV-2 RNA is generally detectable in upper and lower respiratory specimens during the acute phase of infection. Positive results are indicative of active infection with SARS-CoV-2. Clinical  correlation with patient history and other diagnostic information is necessary to determine patient infection status. Positive results do  not rule out bacterial infection or co-infection with other viruses. The expected result is Negative. Fact Sheet for Patients: SugarRoll.be Fact Sheet for Healthcare Providers: https://www.woods-mathews.com/ This test is not yet approved or cleared by the [place] FDA and  has been authorized for detection and/or diagnosis of SARS-CoV-2 by FDA under an Emergency Use Authorization (EUA). This EUA will remain  in effect (meaning this test can be used) for t he duration of the COVID-19 declaration under Section 564(b)(1) of the Act, 21 U.S.C. section 360bbb-3(b)(1), unless the authorization is terminated or revoked sooner. Performed at [facility], [place] [address], [place], [place] [address]   Culture, blood (x 2)     Status: None (Preliminary result)   Collection Time: 10/16/19  8:45 PM   Specimen: BLOOD  Result Value Ref Range Status   Specimen Description   Final    BLOOD LEFT ANTECUBITAL Performed at [facility] [address] [place], [place] [address]    Special Requests   Final    BOTTLES DRAWN AEROBIC AND ANAEROBIC Blood Culture results may not be optimal due to an excessive volume of blood received in culture bottles Performed at [facility] [address], [place], [place] [address]    Culture   Final    NO GROWTH 4 DAYS Performed at [facility], [place] [address] [place], [place] [address]    Report Status PENDING  Incomplete  Culture, blood (x 2)     Status: None (Preliminary result)   Collection Time: 10/16/19  8:45 PM   Specimen: BLOOD  Result Value Ref Range Status   Specimen Description   Final    BLOOD BLOOD LEFT HAND Performed at [facility] [address], [place], [place] [address]    Special Requests   Final    BOTTLES DRAWN AEROBIC AND ANAEROBIC Blood Culture results may not be optimal due to an excessive volume of blood received in culture bottles Performed at [facility] [address], [place], [place] [address]    Culture   Final    NO GROWTH 4 DAYS Performed at [facility], [place] [address], [place], [place] [address]    Report Status PENDING  Incomplete  Culture, Urine     Status: Abnormal   Collection Time: 10/17/19  6:13 PM   Specimen: Urine, Clean Catch  Result Value Ref Range Status   Specimen Description   Final    URINE, CLEAN CATCH Performed at [facility], [place] [name], [place], [place] [address]    Special Requests   Final    NONE Performed at [facility], [place] [address], [place], [place] [address]    Culture (A)  Final    <10,000 COLONIES/mL INSIGNIFICANT GROWTH Performed at [facility] [address] [place], [place] [address]    Report Status 10/19/2019 FINAL  Final    Time coordinating discharge: Approximately 40 minutes  Patrecia Pour, MD  [facility] 10/21/2019, 9:59 AM

## 2019-10-21 NOTE — Progress Notes (Signed)
While this nurse was off the unit, the patient got out of bed to bedside commode, accidentally pulled IV out, and states she wants to remain in the chair. Multiple attempts made by 2 nurses at obtaining another IV, no success, ICU nurse Dorothea Ogle called for US guided IV. [place] at bedside to place IV.

## 2019-10-22 LAB — UIFE/LIGHT CHAINS/TP QN, 24-HR UR
FR KAPPA LT CH,24HR: 130.07 mg/24 hr
FR LAMBDA LT CH,24HR: 31.28 mg/24 hr
Free Kappa Lt Chains,Ur: 148.65 mg/L — ABNORMAL HIGH (ref 0.63–113.79)
Free Kappa/Lambda Ratio: 4.16 (ref 1.03–31.76)
Free Lambda Lt Chains,Ur: 35.75 mg/L — ABNORMAL HIGH (ref 0.47–11.77)
Total Protein, Urine-Ur/day: 317 mg/24 hr — ABNORMAL HIGH (ref 30–150)
Total Protein, Urine: 36.2 mg/dL
Total Volume: 875

## 2019-10-22 LAB — ANTINUCLEAR ANTIBODIES, IFA: ANA Ab, IFA: NEGATIVE

## 2019-11-05 ENCOUNTER — Emergency Department (HOSPITAL_COMMUNITY): Payer: Medicare HMO

## 2019-11-05 ENCOUNTER — Inpatient Hospital Stay (HOSPITAL_COMMUNITY)
Admission: EM | Admit: 2019-11-05 | Discharge: 2019-11-12 | DRG: 683 | Disposition: A | Discharge status: Home / Self Care | Payer: Medicare HMO | Attending: Internal Medicine | Admitting: Internal Medicine

## 2019-11-05 ENCOUNTER — Emergency Department (HOSPITAL_COMMUNITY)
Admit: 2019-11-05 | Discharge: 2019-11-05 | Disposition: A | Discharge status: Home / Self Care | Payer: Medicare HMO | Attending: Emergency Medicine | Admitting: Emergency Medicine

## 2019-11-05 ENCOUNTER — Other Ambulatory Visit: Payer: Self-pay

## 2019-11-05 ENCOUNTER — Encounter (HOSPITAL_COMMUNITY): Payer: Self-pay | Admitting: Emergency Medicine

## 2019-11-05 DIAGNOSIS — I4819 Other persistent atrial fibrillation: Secondary | ICD-10-CM | POA: Diagnosis present

## 2019-11-05 DIAGNOSIS — Z6829 Body mass index (BMI) 29.0-29.9, adult: Secondary | ICD-10-CM | POA: Diagnosis not present

## 2019-11-05 DIAGNOSIS — E861 Hypovolemia: Secondary | ICD-10-CM | POA: Diagnosis present

## 2019-11-05 DIAGNOSIS — D649 Anemia, unspecified: Secondary | ICD-10-CM | POA: Diagnosis not present

## 2019-11-05 DIAGNOSIS — Z20828 Contact with and (suspected) exposure to other viral communicable diseases: Secondary | ICD-10-CM | POA: Diagnosis present

## 2019-11-05 DIAGNOSIS — Z7901 Long term (current) use of anticoagulants: Secondary | ICD-10-CM

## 2019-11-05 DIAGNOSIS — U071 COVID-19: Secondary | ICD-10-CM | POA: Diagnosis present

## 2019-11-05 DIAGNOSIS — I361 Nonrheumatic tricuspid (valve) insufficiency: Secondary | ICD-10-CM | POA: Diagnosis not present

## 2019-11-05 DIAGNOSIS — I1310 Hypertensive heart and chronic kidney disease without heart failure, with stage 1 through stage 4 chronic kidney disease, or unspecified chronic kidney disease: Secondary | ICD-10-CM | POA: Diagnosis present

## 2019-11-05 DIAGNOSIS — J45909 Unspecified asthma, uncomplicated: Secondary | ICD-10-CM | POA: Diagnosis present

## 2019-11-05 DIAGNOSIS — R000 Tachycardia, unspecified: Secondary | ICD-10-CM | POA: Diagnosis present

## 2019-11-05 DIAGNOSIS — Z79899 Other long term (current) drug therapy: Secondary | ICD-10-CM

## 2019-11-05 DIAGNOSIS — E872 Acidosis: Secondary | ICD-10-CM | POA: Diagnosis present

## 2019-11-05 DIAGNOSIS — E875 Hyperkalemia: Secondary | ICD-10-CM | POA: Diagnosis present

## 2019-11-05 DIAGNOSIS — M1000 Idiopathic gout, unspecified site: Secondary | ICD-10-CM | POA: Diagnosis not present

## 2019-11-05 DIAGNOSIS — Z800 Family history of malignant neoplasm of digestive organs: Secondary | ICD-10-CM | POA: Diagnosis not present

## 2019-11-05 DIAGNOSIS — Z801 Family history of malignant neoplasm of trachea, bronchus and lung: Secondary | ICD-10-CM

## 2019-11-05 DIAGNOSIS — Z7951 Long term (current) use of inhaled steroids: Secondary | ICD-10-CM | POA: Diagnosis not present

## 2019-11-05 DIAGNOSIS — M109 Gout, unspecified: Secondary | ICD-10-CM | POA: Diagnosis present

## 2019-11-05 DIAGNOSIS — J1289 Other viral pneumonia: Secondary | ICD-10-CM

## 2019-11-05 DIAGNOSIS — N1832 Chronic kidney disease, stage 3b: Secondary | ICD-10-CM | POA: Diagnosis present

## 2019-11-05 DIAGNOSIS — M1A9XX0 Chronic gout, unspecified, without tophus (tophi): Secondary | ICD-10-CM | POA: Diagnosis present

## 2019-11-05 DIAGNOSIS — N183 Chronic kidney disease, stage 3 unspecified: Secondary | ICD-10-CM

## 2019-11-05 DIAGNOSIS — Z8249 Family history of ischemic heart disease and other diseases of the circulatory system: Secondary | ICD-10-CM | POA: Diagnosis not present

## 2019-11-05 DIAGNOSIS — M7989 Other specified soft tissue disorders: Secondary | ICD-10-CM | POA: Diagnosis present

## 2019-11-05 DIAGNOSIS — G8929 Other chronic pain: Secondary | ICD-10-CM | POA: Diagnosis present

## 2019-11-05 DIAGNOSIS — R197 Diarrhea, unspecified: Secondary | ICD-10-CM | POA: Diagnosis present

## 2019-11-05 DIAGNOSIS — Z8619 Personal history of other infectious and parasitic diseases: Secondary | ICD-10-CM | POA: Diagnosis not present

## 2019-11-05 DIAGNOSIS — N179 Acute kidney failure, unspecified: Principal | ICD-10-CM | POA: Diagnosis present

## 2019-11-05 DIAGNOSIS — I371 Nonrheumatic pulmonary valve insufficiency: Secondary | ICD-10-CM | POA: Diagnosis not present

## 2019-11-05 DIAGNOSIS — I517 Cardiomegaly: Secondary | ICD-10-CM | POA: Diagnosis not present

## 2019-11-05 DIAGNOSIS — D631 Anemia in chronic kidney disease: Secondary | ICD-10-CM | POA: Diagnosis present

## 2019-11-05 DIAGNOSIS — D509 Iron deficiency anemia, unspecified: Secondary | ICD-10-CM | POA: Diagnosis present

## 2019-11-05 DIAGNOSIS — R3916 Straining to void: Secondary | ICD-10-CM | POA: Diagnosis present

## 2019-11-05 DIAGNOSIS — D638 Anemia in other chronic diseases classified elsewhere: Secondary | ICD-10-CM | POA: Diagnosis present

## 2019-11-05 DIAGNOSIS — E669 Obesity, unspecified: Secondary | ICD-10-CM | POA: Diagnosis present

## 2019-11-05 DIAGNOSIS — I10 Essential (primary) hypertension: Secondary | ICD-10-CM | POA: Diagnosis present

## 2019-11-05 LAB — CBC WITH DIFFERENTIAL/PLATELET
Abs Immature Granulocytes: 0.04 10*3/uL (ref 0.00–0.07)
Basophils Absolute: 0 10*3/uL (ref 0.0–0.1)
Basophils Relative: 0 %
Eosinophils Absolute: 0 10*3/uL (ref 0.0–0.5)
Eosinophils Relative: 0 %
HCT: 28.7 % — ABNORMAL LOW (ref 36.0–46.0)
Hemoglobin: 8.6 g/dL — ABNORMAL LOW (ref 12.0–15.0)
Immature Granulocytes: 1 %
Lymphocytes Relative: 5 %
Lymphs Abs: 0.4 10*3/uL — ABNORMAL LOW (ref 0.7–4.0)
MCH: 32.3 pg (ref 26.0–34.0)
MCHC: 30 g/dL (ref 30.0–36.0)
MCV: 107.9 fL — ABNORMAL HIGH (ref 80.0–100.0)
Monocytes Absolute: 0.7 10*3/uL (ref 0.1–1.0)
Monocytes Relative: 9 %
Neutro Abs: 6.4 10*3/uL (ref 1.7–7.7)
Neutrophils Relative %: 85 %
Platelets: 101 10*3/uL — ABNORMAL LOW (ref 150–400)
RBC: 2.66 MIL/uL — ABNORMAL LOW (ref 3.87–5.11)
RDW: 16.8 % — ABNORMAL HIGH (ref 11.5–15.5)
WBC: 7.6 10*3/uL (ref 4.0–10.5)
nRBC: 0 % (ref 0.0–0.2)

## 2019-11-05 LAB — URINALYSIS, ROUTINE W REFLEX MICROSCOPIC
Bilirubin Urine: NEGATIVE
Glucose, UA: NEGATIVE mg/dL
Ketones, ur: NEGATIVE mg/dL
Leukocytes,Ua: NEGATIVE
Nitrite: NEGATIVE
Protein, ur: 30 mg/dL — AB
Specific Gravity, Urine: 1.01 (ref 1.005–1.030)
pH: 5 (ref 5.0–8.0)

## 2019-11-05 LAB — COMPREHENSIVE METABOLIC PANEL
ALT: 26 U/L (ref 0–44)
AST: 37 U/L (ref 15–41)
Albumin: 2.4 g/dL — ABNORMAL LOW (ref 3.5–5.0)
Alkaline Phosphatase: 60 U/L (ref 38–126)
Anion gap: 15 (ref 5–15)
BUN: 68 mg/dL — ABNORMAL HIGH (ref 8–23)
CO2: 15 mmol/L — ABNORMAL LOW (ref 22–32)
Calcium: 9.2 mg/dL (ref 8.9–10.3)
Chloride: 107 mmol/L (ref 98–111)
Creatinine, Ser: 5.49 mg/dL — ABNORMAL HIGH (ref 0.44–1.00)
GFR calc Af Amer: 8 mL/min — ABNORMAL LOW (ref >60)
GFR calc non Af Amer: 7 mL/min — ABNORMAL LOW (ref >60)
Glucose, Bld: 111 mg/dL — ABNORMAL HIGH (ref 70–99)
Potassium: 5.2 mmol/L — ABNORMAL HIGH (ref 3.5–5.1)
Sodium: 137 mmol/L (ref 135–145)
Total Bilirubin: 0.9 mg/dL (ref 0.3–1.2)
Total Protein: 7.5 g/dL (ref 6.5–8.1)

## 2019-11-05 LAB — LACTIC ACID, PLASMA
Lactic Acid, Venous: 2.3 mmol/L (ref 0.5–1.9)
Lactic Acid, Venous: 2.3 mmol/L (ref 0.5–1.9)
Lactic Acid, Venous: 2.4 mmol/L (ref 0.5–1.9)

## 2019-11-05 LAB — MAGNESIUM: Magnesium: 2 mg/dL (ref 1.7–2.4)

## 2019-11-05 LAB — URIC ACID: Uric Acid, Serum: 10.4 mg/dL — ABNORMAL HIGH (ref 2.5–7.1)

## 2019-11-05 LAB — CK: Total CK: 158 U/L (ref 38–234)

## 2019-11-05 LAB — LACTATE DEHYDROGENASE: LDH: 153 U/L (ref 98–192)

## 2019-11-05 LAB — SODIUM, URINE, RANDOM: Sodium, Ur: 55 mmol/L

## 2019-11-05 LAB — CREATININE, URINE, RANDOM: Creatinine, Urine: 76.1 mg/dL

## 2019-11-05 MED ORDER — ACETAMINOPHEN 325 MG PO TABS
650.0000 mg | ORAL_TABLET | Freq: Four times a day (QID) | ORAL | Status: DC | PRN
  Administered 2019-11-11: 325 mg via ORAL
  Filled 2019-11-05: qty 2

## 2019-11-05 MED ORDER — ONDANSETRON HCL 4 MG/2ML IJ SOLN: 4.0000 mg | Freq: Four times a day (QID) | INTRAMUSCULAR | Status: DC | PRN

## 2019-11-05 MED ORDER — MOMETASONE FURO-FORMOTEROL FUM 200-5 MCG/ACT IN AERO
2.0000 | INHALATION_SPRAY | Freq: Two times a day (BID) | RESPIRATORY_TRACT | Status: DC
  Administered 2019-11-05 – 2019-11-12 (×14): 2 via RESPIRATORY_TRACT
  Filled 2019-11-05: qty 8.8

## 2019-11-05 MED ORDER — ATENOLOL 25 MG PO TABS: 25.0000 mg | ORAL_TABLET | Freq: Every day | ORAL | Status: DC

## 2019-11-05 MED ORDER — METOPROLOL TARTRATE 25 MG PO TABS
25.0000 mg | ORAL_TABLET | Freq: Two times a day (BID) | ORAL | Status: DC
  Administered 2019-11-05 – 2019-11-12 (×15): 25 mg via ORAL
  Filled 2019-11-05 (×15): qty 1

## 2019-11-05 MED ORDER — ALBUTEROL SULFATE HFA 108 (90 BASE) MCG/ACT IN AERS
2.0000 | INHALATION_SPRAY | RESPIRATORY_TRACT | Status: DC | PRN
  Filled 2019-11-05: qty 6.7

## 2019-11-05 MED ORDER — HYDROCODONE-ACETAMINOPHEN 5-325 MG PO TABS
1.0000 | ORAL_TABLET | ORAL | Status: DC | PRN
  Administered 2019-11-06: 2 via ORAL
  Administered 2019-11-06 – 2019-11-10 (×4): 1 via ORAL
  Administered 2019-11-12: 2 via ORAL
  Filled 2019-11-05 (×2): qty 1
  Filled 2019-11-05 (×2): qty 2
  Filled 2019-11-05 (×2): qty 1

## 2019-11-05 MED ORDER — SODIUM CHLORIDE 0.9 % IV BOLUS
1000.0000 mL | Freq: Once | INTRAVENOUS | Status: AC
  Administered 2019-11-05: 16:00:00 1000 mL via INTRAVENOUS

## 2019-11-05 MED ORDER — ACETAMINOPHEN 650 MG RE SUPP: 650.0000 mg | Freq: Four times a day (QID) | RECTAL | Status: DC | PRN

## 2019-11-05 MED ORDER — MORPHINE SULFATE (PF) 4 MG/ML IV SOLN
4.0000 mg | Freq: Once | INTRAVENOUS | Status: AC
  Administered 2019-11-05: 16:00:00 4 mg via INTRAVENOUS
  Filled 2019-11-05: qty 1

## 2019-11-05 MED ORDER — SODIUM CHLORIDE 0.9 % IV SOLN
INTRAVENOUS | Status: AC
  Administered 2019-11-05: 23:00:00 via INTRAVENOUS

## 2019-11-05 MED ORDER — ONDANSETRON HCL 4 MG PO TABS: 4.0000 mg | ORAL_TABLET | Freq: Four times a day (QID) | ORAL | Status: DC | PRN

## 2019-11-05 MED ORDER — APIXABAN 5 MG PO TABS
5.0000 mg | ORAL_TABLET | Freq: Two times a day (BID) | ORAL | Status: DC
  Administered 2019-11-05 – 2019-11-08 (×6): 5 mg via ORAL
  Filled 2019-11-05 (×6): qty 1

## 2019-11-05 NOTE — ED Notes (Signed)
Date and time results received: 11/05/19 7:22 PM  (use smartphrase ".now" to insert current time)  Test: Lactic Acid Critical Value: 2.3  Name of Provider Notified: Dr.Zammit  Orders Received? Or Actions Taken?:

## 2019-11-05 NOTE — ED Notes (Signed)
Date and time results received: 11/05/19 1732 (use smartphrase ".now" to insert current time)  Test: Lactic acid Critical Value: 2.3  Name of Provider Notified:   Orders Received? Or Actions Taken?: waiting on orders

## 2019-11-05 NOTE — ED Triage Notes (Addendum)
Per EMS, pt has complaints of generalized weakness and left leg and foot swelling. Pt reports not being ambulatory for the last couple of days due to the swelling. Per EMS, no shortness of breath. BP 158/100, P 110, RR 22, O2 97%, T 97.9.

## 2019-11-05 NOTE — ED Notes (Signed)
EKG given to Dr. Goldston  

## 2019-11-05 NOTE — H&P (Signed)
Diamond [name]:[id number] DOB: 26-Sep-1951 DOA: 11/05/2019     PCP: [name], MD   Outpatient Specialists:     NEphrology:   Dr. Marval Regal   Patient arrived to ER on 11/05/19 at 1346  Patient coming from: home Lives alone,        Chief Complaint:   Chief Complaint  Patient presents with   Covid Positive   Weakness, Leg swelling    HPI: Diamond [name] is a 68 y.o. female with medical history significant of recent COVID infection diagnosed 10/16/2019 HTN, well-controlled asthma, CKD 3A stage, gout    Presented with progressive generalized weakness as well as noted that her left leg and foot started to swell she has not been able to ambulate for the past few days secondary to significant swelling in her left leg. Denies  shortness of breath   HR 110 BP 158/100  97% on RA She had to use wheelchair No back pain No fever no cough Continues to have diarrhea Denies any change in her urine or urine output And straining to urinate No recent bleeding no falls She has been continues to take her medication   Patient reports she has history of gout in the past which usually been treated with colchicine  During admission for treatment of Covid 19 infection she was found to have AKI with creatinine up to 3.82   Baseline 2.2  felt to be related to COVID-19 infection.  Patient was admitted to [facility] was given remdesivir and steroids hospital stay complicated by atrial fibrillation with controlled ventricular rate and converted to normal sinus rhythm she was treated with verapamil and atenolol which is her home medications.  Patient was initially treated with IV heparin but then transitioned to Eliquis. Her renal function has improved at the time of discharge At the time of discharge per nephrology she for to have had chronic CKD 3 a stage B of AKI secondary to COVID-19 infection  Infectious risk factors:  Reports  severe fatigue     KNOWN COVID POSITIVE     Lab  Results  Component Value Date   [place] (A) 10/16/2019     Regarding pertinent Chronic problems:        HTN on verapamil and atenolol hydrochlorothiazide was restarted at discharge    Asthma -well  controlled on home inhalers/ nebs                   No  history of intubation    A. Fib -  - CHA2DS2 vas score 3 :  current  on anticoagulation with   Eliquis,           -  Rate control:  Currently controlled with atenolol     CKD stage IIIa- baseline Cr2.2 Lab Results  Component Value Date   CREATININE 5.49 (H) 11/05/2019   CREATININE 2.37 (H) 10/21/2019   CREATININE 2.50 (H) 10/20/2019     While in ER: Noted to have left foot swelling felt to be mostly consistent with gout Dopplers negative for DVT Blood cultures obtained labs were significant for elevated creatinine up to 5.49 and potassium of 5.2 Patient is continueing to make urine   ER Provider Called:   nephrology  Dr.Sanford  They Recommend admit to medicine  And re consult them in AM    The following Work up has been ordered so far:  Orders Placed This Encounter  Procedures   Culture, blood (routine x 2)  DG Foot Complete Right   DG Foot Complete Left   DG Ankle Complete Left   Lactic acid, plasma   Comprehensive metabolic panel   CBC with Differential   Urinalysis, Routine w reflex microscopic   Cardiac monitoring   Check Rectal Temperature   Consult to hospitalist  ALL PATIENTS BEING ADMITTED/HAVING PROCEDURES NEED COVID-19 SCREENING   Consult to nephrology  ALL PATIENTS BEING ADMITTED/HAVING PROCEDURES NEED COVID-19 SCREENING   ED EKG   EKG 12-Lead   Saline lock IV   VAS [place] LOWER EXTREMITY VENOUS (DVT) ([facility] and [facility] 7a-7p)     Following Medications were ordered in ER: Medications  sodium chloride 0.9 % bolus 1,000 mL (0 mLs Intravenous Stopped 11/05/19 1714)  morphine 4 MG/ML injection 4 mg (4 mg Intravenous Given 11/05/19 1615)        Consult Orders  (From admission,  onward)         Start     Ordered   11/05/19 1823  Consult to hospitalist  ALL PATIENTS BEING ADMITTED/HAVING PROCEDURES NEED COVID-19 SCREENING  Once    Comments: ALL PATIENTS BEING ADMITTED/HAVING PROCEDURES NEED COVID-19 SCREENING  Provider:  (Not yet assigned)  Question Answer Comment  Place call to: [facility],  call [phone]   Reason for Consult Admit      11/05/19 1822           Significant initial  Findings: Abnormal Labs Reviewed  LACTIC ACID, PLASMA - Abnormal; Notable for the following components:      Result Value   Lactic Acid, Venous 2.3 (*)    All other components within normal limits  COMPREHENSIVE METABOLIC PANEL - Abnormal; Notable for the following components:   Potassium 5.2 (*)    CO2 15 (*)    Glucose, Bld 111 (*)    BUN 68 (*)    Creatinine, Ser 5.49 (*)    Albumin 2.4 (*)    GFR calc non Af Amer 7 (*)    GFR calc Af Amer 8 (*)    All other components within normal limits  CBC WITH DIFFERENTIAL/PLATELET - Abnormal; Notable for the following components:   RBC 2.66 (*)    Hemoglobin 8.6 (*)    HCT 28.7 (*)    MCV 107.9 (*)    RDW 16.8 (*)    Platelets 101 (*)    Lymphs Abs 0.4 (*)    All other components within normal limits     Otherwise labs showing:    Recent Labs  Lab 11/05/19 1455 11/05/19 1644  NA 137  --   K 5.2*  --   CO2 15*  --   GLUCOSE 111*  --   BUN 68*  --   CREATININE 5.49*  --   CALCIUM 9.2  --   MG  --  2.0    Cr  Up from baseline see below Lab Results  Component Value Date   CREATININE 5.49 (H) 11/05/2019   CREATININE 2.37 (H) 10/21/2019   CREATININE 2.50 (H) 10/20/2019    Recent Labs  Lab 11/05/19 1455  AST 37  ALT 26  ALKPHOS 60  BILITOT 0.9  PROT 7.5  ALBUMIN 2.4*   Lab Results  Component Value Date   CALCIUM 9.2 11/05/2019   PHOS 4.9 (H) 10/21/2019      WBC      Component Value Date/Time   WBC 7.6 11/05/2019 1455   ANC    Component Value Date/Time   NEUTROABS 6.4 11/05/2019  1455  ALC No components found for: LYMPHAB    Plt: Lab Results  Component Value Date   PLT 101 (L) 11/05/2019    Lactic Acid, Venous    Component Value Date/Time   LATICACIDVEN 2.3 (HH) 11/05/2019 1830       COVID-19 Labs  No results for input(s): DDIMER, FERRITIN, LDH, CRP in the last 72 hours.  Lab Results  Component Value Date   SARSCOV2NAA POSITIVE (A) 10/16/2019     HG/HCT    Stable,     Component Value Date/Time   HGB 8.6 (L) 11/05/2019 1455   HCT 28.7 (L) 11/05/2019 1455      Cardiac Panel (last 3 results) Recent Labs    11/05/19 1644  CKTOTAL 158       ECG: Ordered Personally reviewed by me showing: HR : 120 Rhythm: A.fib. W RVR, RBBB    no evidence of ischemic changes QTC 492  Repeat ECG ordered    BNP (last 3 results) Recent Labs    10/16/19 1730  BNP 37.6    ProBNP (last 3 results) No results for input(s): PROBNP in the last 8760 hours.  DM  labs:  HbA1C: Recent Labs    10/18/19 0120  HGBA1C 5.4       CBG (last 3)  No results for input(s): GLUCAP in the last 72 hours.     UA   no evidence of UTI inc Hg   Urine analysis:    Component Value Date/Time   COLORURINE YELLOW 11/05/2019 [place] 11/05/2019 1633   LABSPEC 1.010 11/05/2019 1633   PHURINE 5.0 11/05/2019 1633   GLUCOSEU NEGATIVE 11/05/2019 1633   HGBUR MODERATE (A) 11/05/2019 1633   BILIRUBINUR NEGATIVE 11/05/2019 1633   KETONESUR NEGATIVE 11/05/2019 1633   PROTEINUR 30 (A) 11/05/2019 1633   NITRITE NEGATIVE 11/05/2019 1633   LEUKOCYTESUR NEGATIVE 11/05/2019 1633       Ordered  Plain imaging of the right foot showing degenerative changes letter of tarsal phalangeal joint first digit  Left foot finding evidence of gout of the first metatarsal phalangeal joint Left ankle swelling   CXR -improving bibasilar airspace opacities evidence of cardiac and vascular congestion   Ultrasound from 12 November showed increased cortical  echogenicity in both kidneys    ED Triage Vitals  Enc Vitals Group     BP 11/05/19 1407 (!) 149/117     Pulse Rate 11/05/19 1407 (!) 101     Resp 11/05/19 1407 18     Temp 11/05/19 1407 99.7 F (37.6 C)     Temp Source 11/05/19 1407 Oral     SpO2 11/05/19 1407 99 %     Weight --      Height --      Head Circumference --      Peak Flow --      Pain Score 11/05/19 1404 10     Pain Loc --      Pain Edu? --      Excl. in [place]? --   TMAX(24)@       Latest  Blood pressure (!) 147/93, pulse 72, temperature 99.7 F (37.6 C), temperature source Rectal, resp. rate 19, SpO2 99 %.    Hospitalist was called for admission for AKI and A. fib with RVR   Review of Systems:    Pertinent positives include: fatigue, diminished mobility left foot left foot pain  Constitutional:  No weight loss, night sweats, Fevers, chills,  weight loss  HEENT:  No  headaches, Difficulty swallowing,Tooth/dental problems,Sore throat,  No sneezing, itching, ear ache, nasal congestion, post nasal drip,  Cardio-vascular:  No chest pain, Orthopnea, PND, anasarca, dizziness, palpitations.no Bilateral lower extremity swelling  GI:  No heartburn, indigestion, abdominal pain, nausea, vomiting, diarrhea, change in bowel habits, loss of appetite, melena, blood in stool, hematemesis Resp:  no shortness of breath at rest. No dyspnea on exertion, No excess mucus, no productive cough, No non-productive cough, No coughing up of blood.No change in color of mucus.No wheezing. Skin:  no rash or lesions. No jaundice GU:  no dysuria, change in color of urine, no urgency or frequency. No straining to urinate.  No flank pain.  Musculoskeletal:  No joint pain or no joint swelling. No decreased range of motion. No back pain.  Psych:  No change in mood or affect. No depression or anxiety. No memory loss.  Neuro: no localizing neurological complaints, no tingling, no weakness, no double vision, no gait abnormality, no slurred  speech, no confusion  All systems reviewed and apart from [place] all are negative  Past Medical History:   Past Medical History:  Diagnosis Date   Asthma    Gout    Hypertension       Past Surgical History:  Procedure Laterality Date   [place]    Social History:  Ambulatory independently at baseline but not lately since developed pain in her foot    reports that she has never smoked. She has never used smokeless tobacco. She reports that she does not drink alcohol or use drugs.   Family History:   Family History  Problem Relation Age of Onset   Hypertension Mother    Hypertension Father    Colon cancer Brother    Lung cancer Brother     Allergies: No Known Allergies   Prior to Admission medications   Medication Sig Start Date End Date Taking? Authorizing Provider  albuterol (VENTOLIN HFA) 108 (90 Base) MCG/ACT inhaler Inhale 2 puffs into the lungs every 4 (four) hours as needed for wheezing or shortness of breath. 10/08/19   [provider]  apixaban (ELIQUIS) 5 MG TABS tablet Take 1 tablet (5 mg total) by mouth 2 (two) times daily. 10/21/19   Patrecia Pour, MD  atenolol (TENORMIN) 25 MG tablet Take 25 mg by mouth daily.    [provider]  Fluticasone-Salmeterol (ADVAIR DISKUS) 250-50 MCG/DOSE AEPB Inhale 1 puff into the lungs 2 (two) times daily.     [provider]  verapamil (CALAN) 120 MG tablet Take 120 mg by mouth 3 (three) times daily.    [provider]   Physical Exam: Blood pressure (!) 147/93, pulse 72, temperature 99.7 F (37.6 C), temperature source Rectal, resp. rate 19, SpO2 99 %. 1. General:  in No  Acute distress   Chronically ill  -appearing 2. Psychological: Alert and   Oriented 3. Head/ENT:     Dry Mucous Membranes                          Head Non traumatic, neck supple                          Poor Dentition 4. SKIN:  decreased Skin turgor,  Skin clean Dry and intact no rash 5.  Heart: Regular rate and rhythm no Murmur, no Rub or gallop 6. Lungs:   no wheezes or crackles   7.  Abdomen: Soft,  non-tender, Non distended  Obese bowel sounds present 8. Lower extremities: no clubbing, cyanosis, redness and swelling around the ankle all the left foot present 9. Neurologically Grossly intact, moving all 4 extremities equally  10. MSK: Normal range of motion   All other LABS:     Recent Labs  Lab 11/05/19 1455  WBC 7.6  NEUTROABS 6.4  HGB 8.6*  HCT 28.7*  MCV 107.9*  PLT 101*     Recent Labs  Lab 11/05/19 1455  NA 137  K 5.2*  CL 107  CO2 15*  GLUCOSE 111*  BUN 68*  CREATININE 5.49*  CALCIUM 9.2     Recent Labs  Lab 11/05/19 1455  AST 37  ALT 26  ALKPHOS 60  BILITOT 0.9  PROT 7.5  ALBUMIN 2.4*       Cultures:    Component Value Date/Time   SDES  10/17/2019 1813    URINE, CLEAN CATCH Performed at [facility], [place] [address] [place], [place] [address]    SPECREQUEST  10/17/2019 1813    NONE Performed at [facility], [place] [address] [place], [place] [address]    CULT (A) 10/17/2019 1813    <10,000 COLONIES/mL INSIGNIFICANT GROWTH Performed at [facility] [address] [place], [place] [address]    REPTSTATUS 10/19/2019 FINAL 10/17/2019 1813     Radiological Exams on Admission: Dg Ankle Complete Left  Result Date: 11/05/2019 CLINICAL DATA:  Pain EXAM: LEFT ANKLE COMPLETE - 3+ VIEW COMPARISON:  None. FINDINGS: There is nonspecific soft tissue swelling about the ankle without evidence for an acute displaced fracture or dislocation. The mortise joint is preserved. A moderate-sized plantar calcaneal spur is noted. Calcifications are noted within the plantar fascia. IMPRESSION: 1. Soft tissue swelling without evidence for acute bony abnormality. 2. Plantar calcaneal spur. 3. Calcifications within the plantar fascia. Electronically Signed   By: Constance Holster M.D.   On: 11/05/2019 17:43     Dg Foot Complete Left  Result Date: 11/05/2019 CLINICAL DATA:  Foot pain. EXAM: LEFT FOOT - COMPLETE 3+ VIEW COMPARISON:  None. FINDINGS: Degenerative changes and erosions are noted at the first metatarsophalangeal joint. There is surrounding soft tissue swelling. There is no acute displaced fracture. No dislocation. Degenerative changes are noted of the midfoot. There is a moderate-sized plantar calcaneal spur. Calcifications are noted within the plantar fascia. There is no acute displaced fracture or dislocation. IMPRESSION: 1. Findings compatible with gout at the first metatarsophalangeal joint. 2. No acute displaced fracture or dislocation. 3. Degenerative changes of the midfoot. 4. Moderate-sized plantar calcaneal spur. 5. Calcifications within the plantar fascia may be secondary to underlying plantar fasciitis in the appropriate clinical setting. Clinical correlation is recommended. Electronically Signed   By: Constance Holster M.D.   On: 11/05/2019 17:42   Dg Foot Complete Right  Result Date: 11/05/2019 CLINICAL DATA:  Bilateral foot pain. EXAM: RIGHT FOOT COMPLETE - 3+ VIEW COMPARISON:  09/06/2015 FINDINGS: There are advanced degenerative changes of metatarsophalangeal joint of the first digit. There is surrounding soft tissue swelling. There may be some mild associated erosions. These findings have progressed since 2016. There is a moderate-sized plantar calcaneal spur. Calcifications are noted in the region of the plantar fascia at the level of the hindfoot. Midfoot degenerative changes are noted. There is no acute displaced fracture or dislocation. IMPRESSION: 1. Advanced degenerative changes of the metatarsophalangeal joint of the first digit with surrounding soft tissue swelling. There may  be some mild associated erosions. These findings have progressed since 2016 inter suspicious for gout. 2. Calcifications within the plantar fascia can be seen in patients with plantar fasciitis. 3. No  acute displaced fracture or dislocation. Electronically Signed   By: Constance Holster M.D.   On: 11/05/2019 17:41   Vas [place] Lower Extremity Venous (dvt) ([facility] And [facility] 7a-7p)  Result Date: 11/05/2019  Lower Venous Study Indications: Swelling.  Risk Factors: None identified. Limitations: Poor ultrasound/tissue interface and body habitus. Comparison Study: No prior stuides. Performing Technologist: Oliver Hum RVT  Examination Guidelines: A complete evaluation includes B-mode imaging, spectral Doppler, color Doppler, and power Doppler as needed of all accessible portions of each vessel. Bilateral testing is considered an integral part of a complete examination. Limited examinations for reoccurring indications may be performed as noted.  +-----+---------------+---------+-----------+----------+--------------+  RIGHT Compressibility Phasicity Spontaneity Properties Thrombus Aging  +-----+---------------+---------+-----------+----------+--------------+  CFV   Full            Yes       Yes                                    +-----+---------------+---------+-----------+----------+--------------+   +---------+---------------+---------+-----------+----------+--------------+  LEFT      Compressibility Phasicity Spontaneity Properties Thrombus Aging  +---------+---------------+---------+-----------+----------+--------------+  CFV       Full            Yes       Yes                                    +---------+---------------+---------+-----------+----------+--------------+  SFJ       Full                                                             +---------+---------------+---------+-----------+----------+--------------+  FV Prox   Full                                                             +---------+---------------+---------+-----------+----------+--------------+  FV Mid    Full                                                             +---------+---------------+---------+-----------+----------+--------------+   FV Distal Full                                                             +---------+---------------+---------+-----------+----------+--------------+  PFV       Full                                                             +---------+---------------+---------+-----------+----------+--------------+  POP       Full            Yes       Yes                                    +---------+---------------+---------+-----------+----------+--------------+  PTV       Full                                                             +---------+---------------+---------+-----------+----------+--------------+  PERO      Full                                                             +---------+---------------+---------+-----------+----------+--------------+     Summary: Right: No evidence of common femoral vein obstruction. Left: There is no evidence of deep vein thrombosis in the lower extremity. No cystic structure found in the popliteal fossa.  *See table(s) above for measurements and observations.    Preliminary     Chart has been reviewed    Assessment/Plan  68 y.o. female with medical history significant of recent COVID infection diagnosed 10/16/2019 HTN, well-controlled asthma, CKD 3A stage, gout    Admitted for AKI in the setting of chronic kidney disease and persistent diarrhea status post recent Covid infection  Present on Admission:  AKI (acute kidney injury) ([place]) -obtain urine electrolytes rehydrate fall renal status.  If persists or significant AKI would reconsult nephrology   Cardiomegaly - order ECHO, be mindful total fluid overload   Gout -not a candidate for NSAIDs given his CKD and recent Covid.  Given CKD would hold off on colchicine for now, will use prednisone Check uric acid level   Hypertension -resume beta-blocker but will need to change from atenolol to metoprolol due to renal clearance   Anemia of chronic disease - obtain anemia panel likely related to chronic kidney  disease   Pneumonia due to COVID-19 virus -imaging showing improvement patient at this point denies any respiratory symptoms no longer hypoxic discussed with GVC If retests positive: Back for possible admission to [facility] otherwise keep at [facility] long as patient on the cusp of recovery from Covid   Hyperkalemia -mild repeat be met after rehydration and follow   Other plan as per orders.  DVT prophylaxis: Eliquis  Code Status:  FULL CODE  as per patient   I had personally discussed CODE STATUS with patient    Family Communication:   Family not at  Bedside    Disposition Plan:     To home once workup is complete and patient is stable                    Would benefit from PT/OT eval prior to DC  Ordered                                        Consults called: discussed with Gerald Md per  they rec would repeat COVID testing per heir request  Nephrology was made aware but requesting re consulting in AM   Admission status:  ED Disposition    ED Disposition Condition [place]: [place] [[id number]]  Level of Care: Telemetry [5]  Admit to tele based on following criteria: Other see comments  Comments: aki  Covid Evaluation: Confirmed COVID Positive  Diagnosis: AKI (acute kidney injury) [facility]) [[id number]]  Admitting Physician: Toy Baker [[id number]]  Attending Physician: Toy Baker [[id number]]  Estimated length of stay: 3 - 4 days  Certification:: I certify this patient will need inpatient services for at least 2 midnights  PT Class (Do Not Modify): Inpatient [101]  PT Acc Code (Do Not Modify): Private [1]         inpatient     Expect 2 midnight stay secondary to severity of patient's current illness including   hemodynamic instability despite optimal treatment (tachycardia  )   Severe lab/radiological/exam abnormalities including:    AKI and extensive comorbidities including:  Chronic pain  Obesity  CKD   Chronic  anticoagulation Hx of COVID induced illness recently  That are currently affecting medical management.   I expect  patient to be hospitalized for 2 midnights requiring inpatient medical care.  Patient is at high risk for adverse outcome (such as loss of life or disability) if not treated.  Indication for inpatient stay as follows:  Severe change from baseline regarding mental status Hemodynamic instability despite maximal medical therapy,     severe pain requiring acute inpatient management,       Need for   IV fluids  IV pain medications      Level of care   tele  For   24H   please discontinue once patient no longer qualifies   Precautions: admitted as covid positive Airborne and Contact precautions   PPE: Used by the provider:   P100  eye Goggles,  Gloves  gown     Fredonia Casalino 11/05/2019, 10:10 PM    [facility]     after 2 AM please page floor coverage PA If 7AM-7PM, please contact the day team taking care of the patient using [email or web address]

## 2019-11-05 NOTE — ED Provider Notes (Signed)
I spoke with Dr. Joelyn Oms nephrology and he stated the patient can be admitted to [facility].  He also stated that if the hospitalist needs nephrology to see the patient tomorrow in consult and they should put in a consult at that time and they will be glad to see the patient   Milton Ferguson, MD 11/05/19 1911

## 2019-11-05 NOTE — Progress Notes (Signed)
Left lower extremity venous duplex has been completed. Preliminary results can be found in CV Proc through chart review.  Results were given to Dr. Regenia Skeeter.  11/05/19 4:46 PM Carlos Levering RVT

## 2019-11-05 NOTE — ED Provider Notes (Signed)
[facility] DEPT Provider Note   CSN: [id number] Arrival date & time: 11/05/19  1346     History   Chief Complaint Chief Complaint  Patient presents with  . Covid Positive  . Weakness, Leg swelling    HPI Diamond [name] is a 68 y.o. female.     HPI  68 year old female presents with chief complaint of left leg swelling.  She states this has been ongoing since she got out of the hospital about 2 weeks ago.  She also endorses right foot pain in addition to the left foot pain and swelling.  These are so bad that she has not been able to get up and walk and has been using a wheelchair for the last week or so.  However she denies that they are weak besides feeling generally weak all over.  There is no back pain.  No fever, cough, shortness of breath.  She has recovered from her Covid symptoms.  Past Medical History:  Diagnosis Date  . Asthma   . Gout   . Hypertension     Patient Active Problem List   Diagnosis Date Noted  . Hypokalemia 10/18/2019  . Pneumonia due to COVID-19 virus 10/17/2019  . CAP (community acquired pneumonia) 10/16/2019  . Hypertension   . Asthma   . Gout   . AKI (acute kidney injury) ([place])   . Hyponatremia   . Anemia of chronic disease     Past Surgical History:  Procedure Laterality Date  . CESAREAN SECTION  1989     OB History   No obstetric history on file.      Home Medications    Prior to Admission medications   Medication Sig Start Date End Date Taking? Authorizing Provider  albuterol (VENTOLIN HFA) 108 (90 Base) MCG/ACT inhaler Inhale 2 puffs into the lungs every 4 (four) hours as needed for wheezing or shortness of breath. 10/08/19   [provider]  apixaban (ELIQUIS) 5 MG TABS tablet Take 1 tablet (5 mg total) by mouth 2 (two) times daily. 10/21/19   Patrecia Pour, MD  atenolol (TENORMIN) 25 MG tablet Take 25 mg by mouth daily.    [provider]  Fluticasone-Salmeterol (ADVAIR  DISKUS) 250-50 MCG/DOSE AEPB Inhale 1 puff into the lungs 2 (two) times daily.     [provider]  hydrochlorothiazide (HYDRODIURIL) 25 MG tablet Take 25 mg by mouth daily.    [provider]  verapamil (CALAN) 120 MG tablet Take 120 mg by mouth 3 (three) times daily.    [provider]    Family History Family History  Problem Relation Age of Onset  . Hypertension Mother   . Hypertension Father   . Colon cancer Brother   . Lung cancer Brother     Social History Social History   Tobacco Use  . Smoking status: Never Smoker  . Smokeless tobacco: Never Used  Substance Use Topics  . Alcohol use: No  . Drug use: No     Allergies   Patient has no known allergies.   Review of Systems Review of Systems  Constitutional: Negative for fever.  Respiratory: Negative for cough and shortness of breath.   Cardiovascular: Positive for leg swelling. Negative for chest pain.  Gastrointestinal: Negative for vomiting.  Musculoskeletal: Positive for arthralgias and joint swelling.  Neurological: Positive for weakness.  All other systems reviewed and are negative.    Physical Exam Updated Vital Signs BP (!) 149/117 (BP Location: Left  Arm) Comment: Pt states she has not had her BP meds  Pulse (!) 101   Temp 99.7 F (37.6 C) (Rectal)   Resp 18   SpO2 99%   Physical Exam Vitals signs and nursing note reviewed.  Constitutional:      General: She is not in acute distress.    Appearance: She is well-developed. She is not ill-appearing or diaphoretic.  HENT:     Head: Normocephalic and atraumatic.     Right Ear: External ear normal.     Left Ear: External ear normal.     Nose: Nose normal.  Eyes:     General:        Right eye: No discharge.        Left eye: No discharge.  Cardiovascular:     Rate and Rhythm: Regular rhythm. Tachycardia present.     Heart sounds: Normal heart sounds.  Pulmonary:     Effort: Pulmonary effort is normal.  Abdominal:      General: There is no distension.     Palpations: Abdomen is soft.     Tenderness: There is no abdominal tenderness.  Musculoskeletal:     Left knee: She exhibits swelling. Tenderness found.     Left ankle: She exhibits swelling. She exhibits normal range of motion. No tenderness.     Right lower leg: No edema.     Left lower leg: Edema present.     Right foot: Tenderness present. No swelling.     Left foot: Tenderness and swelling present.       Feet:     Comments: Left lower leg, knee, ankle and foot are all diffusely swollen with some pitting edema.  There is some darkening of the skin and mild warmth to the left foot and ankle.  Has some healing abrasions to the lateral ankle.  Skin:    General: Skin is warm and dry.  Neurological:     Mental Status: She is alert.  Psychiatric:        Mood and Affect: Mood is not anxious.      ED Treatments / Results  Labs (all labs ordered are listed, but only abnormal results are displayed) Labs Reviewed  CULTURE, BLOOD (ROUTINE X 2)  CULTURE, BLOOD (ROUTINE X 2)  LACTIC ACID, PLASMA  LACTIC ACID, PLASMA  COMPREHENSIVE METABOLIC PANEL  CBC WITH DIFFERENTIAL/PLATELET  URINALYSIS, ROUTINE W REFLEX MICROSCOPIC    EKG EKG Interpretation  Date/Time:  Monday November 05 2019 15:29:38 EST Ventricular Rate:  120 PR Interval:    QRS Duration: 132 QT Interval:  348 QTC Calculation: 492 R Axis:   45 Text Interpretation: Age not entered, assumed to be  68 years old for purpose of ECG interpretation Atrial fibrillation Right bundle branch block rate is faster compared to Oct 17 2019 Confirmed by Sherwood Gambler [phone]) on 11/05/2019 4:29:22 PM   Radiology No results found.  Procedures Procedures (including critical care time)  Medications Ordered in ED Medications  sodium chloride 0.9 % bolus 1,000 mL (1,000 mLs Intravenous New Bag/Given 11/05/19 1614)  morphine 4 MG/ML injection 4 mg (4 mg Intravenous Given 11/05/19 1615)      Initial Impression / Assessment and Plan / ED Course  I have reviewed the triage vital signs and the nursing notes.  Pertinent labs & imaging results that were available during my care of the patient were reviewed by me and considered in my medical decision making (see chart for details).  Patient will need workup for DVT vs cellulitis. Low grade temp with mild tachycardia. Will get labs to workup for sepsis. No pulmonary/covid complaints. Care to Dr. Roderic Palau.  Final Clinical Impressions(s) / ED Diagnoses   Final diagnoses:  None    ED Discharge Orders    None       Sherwood Gambler, MD 11/05/19 1643

## 2019-11-05 NOTE — ED Notes (Signed)
ED TO INPATIENT HANDOFF REPORT  Name/Age/Gender Diamond [name] 68 y.o. female  Code Status Code Status History    Date Active Date Inactive Code Status Order ID Comments User Context   10/17/2019 0444 10/21/2019 1629 Full Code [id number]  Reubin Milan, MD ED   10/16/2019 2110 10/17/2019 0444 Full Code [id number]  Reubin Milan, MD ED   Advance Care Planning Activity      Home/SNF/Other Home  Chief Complaint covid positive  Level of Care/Admitting Diagnosis ED Disposition    ED Disposition Condition Comment   Admit  The patient appears reasonably stabilized for admission considering the current resources, flow, and capabilities available in the ED at this time, and I doubt any other [facility] requiring further screening and/or treatment in the ED prior to admission is  present.       Medical History Past Medical History:  Diagnosis Date  . Asthma   . Gout   . Hypertension     Allergies No Known Allergies  IV Location/Drains/Wounds Patient Lines/Drains/Airways Status   Active Line/Drains/Airways    Name:   Placement date:   Placement time:   Site:   Days:   Peripheral IV 10/21/19 Anterior;Left;Proximal Forearm   10/21/19    0920    Forearm   15   Peripheral IV 11/05/19 Right Hand   11/05/19    1614    Hand   less than 1   External Urinary Catheter   11/05/19    1753    -   less than 1          Labs/Imaging Results for orders placed or performed during the hospital encounter of 11/05/19 (from the past 48 hour(s))  Lactic acid, plasma     Status: Abnormal   Collection Time: 11/05/19  2:55 PM  Result Value Ref Range   Lactic Acid, Venous 2.3 (HH) 0.5 - 1.9 mmol/L    Comment: CRITICAL RESULT CALLED TO, READ BACK BY AND VERIFIED WITH: J.NASH [id number] @1732  BY V.WILKINS Performed at [facility] [address], [place], [place] [address]   Comprehensive metabolic panel     Status: Abnormal   Collection Time: 11/05/19  2:55 PM  Result  Value Ref Range   Sodium 137 135 - 145 mmol/L   Potassium 5.2 (H) 3.5 - 5.1 mmol/L   Chloride 107 98 - 111 mmol/L   CO2 15 (L) 22 - 32 mmol/L   Glucose, Bld 111 (H) 70 - 99 mg/dL   BUN 68 (H) 8 - 23 mg/dL   Creatinine, Ser 5.49 (H) 0.44 - 1.00 mg/dL   Calcium 9.2 8.9 - 10.3 mg/dL   Total Protein 7.5 6.5 - 8.1 g/dL   Albumin 2.4 (L) 3.5 - 5.0 g/dL   AST 37 15 - 41 U/L   ALT 26 0 - 44 U/L   Alkaline Phosphatase 60 38 - 126 U/L   Total Bilirubin 0.9 0.3 - 1.2 mg/dL   GFR calc non Af Amer 7 (L) >60 mL/min   GFR calc Af Amer 8 (L) >60 mL/min   Anion gap 15 5 - 15    Comment: Performed at [facility], [place] [address], [place], [place] [address]  CBC with Differential     Status: Abnormal   Collection Time: 11/05/19  2:55 PM  Result Value Ref Range   WBC 7.6 4.0 - 10.5 K/uL   RBC 2.66 (L) 3.87 - 5.11 MIL/uL   Hemoglobin 8.6 (L) 12.0 -  15.0 g/dL   HCT 28.7 (L) 36.0 - 46.0 %   MCV 107.9 (H) 80.0 - 100.0 fL   MCH 32.3 26.0 - 34.0 pg   MCHC 30.0 30.0 - 36.0 g/dL   RDW 16.8 (H) 11.5 - 15.5 %   Platelets 101 (L) 150 - 400 K/uL    Comment: REPEATED TO VERIFY PLATELET COUNT CONFIRMED BY SMEAR Immature Platelet Fraction may be clinically indicated, consider ordering this additional test [id number]    nRBC 0.0 0.0 - 0.2 %   Neutrophils Relative % 85 %   Neutro Abs 6.4 1.7 - 7.7 K/uL   Lymphocytes Relative 5 %   Lymphs Abs 0.4 (L) 0.7 - 4.0 K/uL   Monocytes Relative 9 %   Monocytes Absolute 0.7 0.1 - 1.0 K/uL   Eosinophils Relative 0 %   Eosinophils Absolute 0.0 0.0 - 0.5 K/uL   Basophils Relative 0 %   Basophils Absolute 0.0 0.0 - 0.1 K/uL   Immature Granulocytes 1 %   Abs Immature Granulocytes 0.04 0.00 - 0.07 K/uL    Comment: Performed at [facility], [place] [address] [place], [place] [address]  Urinalysis, Routine w reflex microscopic     Status: Abnormal   Collection Time: 11/05/19  4:33 PM  Result Value Ref Range   Color, Urine YELLOW  YELLOW   APPearance CLEAR CLEAR   Specific Gravity, Urine 1.010 1.005 - 1.030   pH 5.0 5.0 - 8.0   Glucose, UA NEGATIVE NEGATIVE mg/dL   Hgb urine dipstick MODERATE (A) NEGATIVE   Bilirubin Urine NEGATIVE NEGATIVE   Ketones, ur NEGATIVE NEGATIVE mg/dL   Protein, ur 30 (A) NEGATIVE mg/dL   Nitrite NEGATIVE NEGATIVE   Leukocytes,Ua NEGATIVE NEGATIVE   RBC / HPF 0-5 0 - 5 RBC/hpf   WBC, UA 0-5 0 - 5 WBC/hpf   Bacteria, UA RARE (A) NONE SEEN    Comment: Performed at [facility], [place] [address] [place], [place] [address]  Lactic acid, plasma     Status: Abnormal   Collection Time: 11/05/19  6:30 PM  Result Value Ref Range   Lactic Acid, Venous 2.3 (HH) 0.5 - 1.9 mmol/L    Comment: CRITICAL RESULT CALLED TO, READ BACK BY AND VERIFIED WITH: A.HODGES,RN [id number] @1922  BY V.WILKINS Performed at [facility] [address] [place], [place] [address]    Dg Ankle Complete Left  Result Date: 11/05/2019 CLINICAL DATA:  Pain EXAM: LEFT ANKLE COMPLETE - 3+ VIEW COMPARISON:  None. FINDINGS: There is nonspecific soft tissue swelling about the ankle without evidence for an acute displaced fracture or dislocation. The mortise joint is preserved. A moderate-sized plantar calcaneal spur is noted. Calcifications are noted within the plantar fascia. IMPRESSION: 1. Soft tissue swelling without evidence for acute bony abnormality. 2. Plantar calcaneal spur. 3. Calcifications within the plantar fascia. Electronically Signed   By: Constance Holster M.D.   On: 11/05/2019 17:43   Dg Foot Complete Left  Result Date: 11/05/2019 CLINICAL DATA:  Foot pain. EXAM: LEFT FOOT - COMPLETE 3+ VIEW COMPARISON:  None. FINDINGS: Degenerative changes and erosions are noted at the first metatarsophalangeal joint. There is surrounding soft tissue swelling. There is no acute displaced fracture. No dislocation. Degenerative changes are noted of the midfoot. There is a moderate-sized plantar  calcaneal spur. Calcifications are noted within the plantar fascia. There is no acute displaced fracture or dislocation. IMPRESSION: 1. Findings compatible with gout at the first metatarsophalangeal joint. 2. No acute  displaced fracture or dislocation. 3. Degenerative changes of the midfoot. 4. Moderate-sized plantar calcaneal spur. 5. Calcifications within the plantar fascia may be secondary to underlying plantar fasciitis in the appropriate clinical setting. Clinical correlation is recommended. Electronically Signed   By: Constance Holster M.D.   On: 11/05/2019 17:42   Dg Foot Complete Right  Result Date: 11/05/2019 CLINICAL DATA:  Bilateral foot pain. EXAM: RIGHT FOOT COMPLETE - 3+ VIEW COMPARISON:  09/06/2015 FINDINGS: There are advanced degenerative changes of metatarsophalangeal joint of the first digit. There is surrounding soft tissue swelling. There may be some mild associated erosions. These findings have progressed since 2016. There is a moderate-sized plantar calcaneal spur. Calcifications are noted in the region of the plantar fascia at the level of the hindfoot. Midfoot degenerative changes are noted. There is no acute displaced fracture or dislocation. IMPRESSION: 1. Advanced degenerative changes of the metatarsophalangeal joint of the first digit with surrounding soft tissue swelling. There may be some mild associated erosions. These findings have progressed since 2016 inter suspicious for gout. 2. Calcifications within the plantar fascia can be seen in patients with plantar fasciitis. 3. No acute displaced fracture or dislocation. Electronically Signed   By: Constance Holster M.D.   On: 11/05/2019 17:41   Vas [place] Lower Extremity Venous (dvt) (mc And Wl 7a-7p)  Result Date: 11/05/2019  Lower Venous Study Indications: Swelling.  Risk Factors: None identified. Limitations: Poor ultrasound/tissue interface and body habitus. Comparison Study: No prior stuides. Performing Technologist: Oliver Hum RVT  Examination Guidelines: A complete evaluation includes B-mode imaging, spectral Doppler, color Doppler, and power Doppler as needed of all accessible portions of each vessel. Bilateral testing is considered an integral part of a complete examination. Limited examinations for reoccurring indications may be performed as noted.  +-----+---------------+---------+-----------+----------+--------------+ RIGHTCompressibilityPhasicitySpontaneityPropertiesThrombus Aging +-----+---------------+---------+-----------+----------+--------------+ CFV  Full           Yes      Yes                                 +-----+---------------+---------+-----------+----------+--------------+   +---------+---------------+---------+-----------+----------+--------------+ LEFT     CompressibilityPhasicitySpontaneityPropertiesThrombus Aging +---------+---------------+---------+-----------+----------+--------------+ CFV      Full           Yes      Yes                                 +---------+---------------+---------+-----------+----------+--------------+ SFJ      Full                                                        +---------+---------------+---------+-----------+----------+--------------+ FV Prox  Full                                                        +---------+---------------+---------+-----------+----------+--------------+ FV Mid   Full                                                        +---------+---------------+---------+-----------+----------+--------------+  FV DistalFull                                                        +---------+---------------+---------+-----------+----------+--------------+ PFV      Full                                                        +---------+---------------+---------+-----------+----------+--------------+ POP      Full           Yes      Yes                                  +---------+---------------+---------+-----------+----------+--------------+ PTV      Full                                                        +---------+---------------+---------+-----------+----------+--------------+ PERO     Full                                                        +---------+---------------+---------+-----------+----------+--------------+     Summary: Right: No evidence of common femoral vein obstruction. Left: There is no evidence of deep vein thrombosis in the lower extremity. No cystic structure found in the popliteal fossa.  *See table(s) above for measurements and observations.    Preliminary     Pending Labs Unresulted Labs (From admission, onward)    Start     Ordered   11/05/19 2100  Lactic acid, plasma  STAT Now then every 3 hours,   STAT     11/05/19 1923   11/05/19 1920  Creatinine, urine, random  Add-on,   AD     11/05/19 1919   11/05/19 1920  Sodium, urine, random  Add-on,   AD     11/05/19 1919   11/05/19 1918  CK  Add-on,   AD     11/05/19 1917   11/05/19 1918  Uric acid  Add-on,   AD     11/05/19 1917   11/05/19 1918  Magnesium  Add-on,   AD     11/05/19 1917   11/05/19 1558  Culture, blood (routine x 2)  BLOOD CULTURE X 2,   STAT     11/05/19 1557          Vitals/Pain Today's Vitals   11/05/19 1605 11/05/19 1800 11/05/19 1822 11/05/19 1930  BP:  (!) 147/93 (!) 147/93 (!) 153/117  Pulse:  (!) 108 72   Resp:  18 19 17   Temp: 99.7 F (37.6 C)     TempSrc: Rectal     SpO2:  96% 99%   PainSc:        Isolation Precautions Airborne and Contact precautions  Medications Medications  sodium chloride 0.9 % bolus 1,000 mL (0  mLs Intravenous Stopped 11/05/19 1714)  morphine 4 MG/ML injection 4 mg (4 mg Intravenous Given 11/05/19 1615)    Mobility non-ambulatory

## 2019-11-05 NOTE — ED Notes (Signed)
Pt was on cardiac monitor but it is no longer picking up

## 2019-11-06 ENCOUNTER — Inpatient Hospital Stay (HOSPITAL_COMMUNITY): Payer: Medicare HMO

## 2019-11-06 DIAGNOSIS — I371 Nonrheumatic pulmonary valve insufficiency: Secondary | ICD-10-CM

## 2019-11-06 DIAGNOSIS — I361 Nonrheumatic tricuspid (valve) insufficiency: Secondary | ICD-10-CM

## 2019-11-06 LAB — BASIC METABOLIC PANEL
Anion gap: 10 (ref 5–15)
Anion gap: 16 — ABNORMAL HIGH (ref 5–15)
BUN: 62 mg/dL — ABNORMAL HIGH (ref 8–23)
BUN: 65 mg/dL — ABNORMAL HIGH (ref 8–23)
CO2: 16 mmol/L — ABNORMAL LOW (ref 22–32)
CO2: 14 mmol/L — ABNORMAL LOW (ref 22–32)
Calcium: 8.2 mg/dL — ABNORMAL LOW (ref 8.9–10.3)
Calcium: 8.8 mg/dL — ABNORMAL LOW (ref 8.9–10.3)
Chloride: 114 mmol/L — ABNORMAL HIGH (ref 98–111)
Chloride: 113 mmol/L — ABNORMAL HIGH (ref 98–111)
Creatinine, Ser: 4.8 mg/dL — ABNORMAL HIGH (ref 0.44–1.00)
Creatinine, Ser: 4.87 mg/dL — ABNORMAL HIGH (ref 0.44–1.00)
GFR calc Af Amer: 10 mL/min — ABNORMAL LOW (ref >60)
GFR calc Af Amer: 10 mL/min — ABNORMAL LOW (ref >60)
GFR calc non Af Amer: 9 mL/min — ABNORMAL LOW (ref >60)
GFR calc non Af Amer: 8 mL/min — ABNORMAL LOW (ref >60)
Glucose, Bld: 94 mg/dL (ref 70–99)
Glucose, Bld: 109 mg/dL — ABNORMAL HIGH (ref 70–99)
Potassium: 3.8 mmol/L (ref 3.5–5.1)
Potassium: 4.5 mmol/L (ref 3.5–5.1)
Sodium: 140 mmol/L (ref 135–145)
Sodium: 143 mmol/L (ref 135–145)

## 2019-11-06 LAB — RETICULOCYTES
Immature Retic Fract: 18.3 % — ABNORMAL HIGH (ref 2.3–15.9)
RBC.: 2.33 MIL/uL — ABNORMAL LOW (ref 3.87–5.11)
Retic Count, Absolute: 31.9 10*3/uL (ref 19.0–186.0)
Retic Ct Pct: 1.4 % (ref 0.4–3.1)

## 2019-11-06 LAB — CBC
HCT: 24.2 % — ABNORMAL LOW (ref 36.0–46.0)
Hemoglobin: 7.7 g/dL — ABNORMAL LOW (ref 12.0–15.0)
MCH: 32.5 pg (ref 26.0–34.0)
MCHC: 31.8 g/dL (ref 30.0–36.0)
MCV: 102.1 fL — ABNORMAL HIGH (ref 80.0–100.0)
Platelets: 111 10*3/uL — ABNORMAL LOW (ref 150–400)
RBC: 2.37 MIL/uL — ABNORMAL LOW (ref 3.87–5.11)
RDW: 16.6 % — ABNORMAL HIGH (ref 11.5–15.5)
WBC: 7.3 10*3/uL (ref 4.0–10.5)
nRBC: 0 % (ref 0.0–0.2)

## 2019-11-06 LAB — COMPREHENSIVE METABOLIC PANEL
ALT: 25 U/L (ref 0–44)
AST: 19 U/L (ref 15–41)
Albumin: 2.2 g/dL — ABNORMAL LOW (ref 3.5–5.0)
Alkaline Phosphatase: 59 U/L (ref 38–126)
Anion gap: 12 (ref 5–15)
BUN: 66 mg/dL — ABNORMAL HIGH (ref 8–23)
CO2: 17 mmol/L — ABNORMAL LOW (ref 22–32)
Calcium: 9 mg/dL (ref 8.9–10.3)
Chloride: 110 mmol/L (ref 98–111)
Creatinine, Ser: 5.03 mg/dL — ABNORMAL HIGH (ref 0.44–1.00)
GFR calc Af Amer: 9 mL/min — ABNORMAL LOW (ref >60)
GFR calc non Af Amer: 8 mL/min — ABNORMAL LOW (ref >60)
Glucose, Bld: 137 mg/dL — ABNORMAL HIGH (ref 70–99)
Potassium: 4.2 mmol/L (ref 3.5–5.1)
Sodium: 139 mmol/L (ref 135–145)
Total Bilirubin: 0.4 mg/dL (ref 0.3–1.2)
Total Protein: 7 g/dL (ref 6.5–8.1)

## 2019-11-06 LAB — ECHOCARDIOGRAM COMPLETE

## 2019-11-06 LAB — MAGNESIUM: Magnesium: 2 mg/dL (ref 1.7–2.4)

## 2019-11-06 LAB — FERRITIN
Ferritin: 756 ng/mL — ABNORMAL HIGH (ref 11–307)
Ferritin: 290 ng/mL (ref 11–307)

## 2019-11-06 LAB — IRON AND TIBC
Iron: 15 ug/dL — ABNORMAL LOW (ref 28–170)
Saturation Ratios: 10 % — ABNORMAL LOW (ref 10.4–31.8)
TIBC: 147 ug/dL — ABNORMAL LOW (ref 250–450)
UIBC: 132 ug/dL

## 2019-11-06 LAB — PHOSPHORUS: Phosphorus: 6.7 mg/dL — ABNORMAL HIGH (ref 2.5–4.6)

## 2019-11-06 LAB — SARS CORONAVIRUS 2 (TAT 6-24 HRS): SARS Coronavirus 2: NEGATIVE

## 2019-11-06 LAB — FOLATE: Folate: 14.1 ng/mL (ref >5.9)

## 2019-11-06 LAB — LACTIC ACID, PLASMA: Lactic Acid, Venous: 1.3 mmol/L (ref 0.5–1.9)

## 2019-11-06 LAB — TSH: TSH: 2.26 u[IU]/mL (ref 0.350–4.500)

## 2019-11-06 LAB — VITAMIN B12: Vitamin B-12: 222 pg/mL (ref 180–914)

## 2019-11-06 MED ORDER — LABETALOL HCL 5 MG/ML IV SOLN
5.0000 mg | Freq: Four times a day (QID) | INTRAVENOUS | Status: DC | PRN
  Administered 2019-11-06 – 2019-11-11 (×2): 5 mg via INTRAVENOUS
  Filled 2019-11-06 (×2): qty 4

## 2019-11-06 MED ORDER — SODIUM CHLORIDE 0.9 % IV SOLN
510.0000 mg | Freq: Once | INTRAVENOUS | Status: AC
  Administered 2019-11-06: 510 mg via INTRAVENOUS
  Filled 2019-11-06: qty 17

## 2019-11-06 MED ORDER — VERAPAMIL HCL 120 MG PO TABS
120.0000 mg | ORAL_TABLET | Freq: Three times a day (TID) | ORAL | Status: DC
  Administered 2019-11-06 – 2019-11-12 (×19): 120 mg via ORAL
  Filled 2019-11-06 (×21): qty 1

## 2019-11-06 MED ORDER — ATENOLOL 25 MG PO TABS: 25.0000 mg | ORAL_TABLET | Freq: Every day | ORAL | Status: DC

## 2019-11-06 MED ORDER — PREDNISONE 20 MG PO TABS
40.0000 mg | ORAL_TABLET | Freq: Every day | ORAL | Status: DC
  Administered 2019-11-06 – 2019-11-08 (×3): 40 mg via ORAL
  Filled 2019-11-06 (×3): qty 2

## 2019-11-06 NOTE — Evaluation (Signed)
Occupational Therapy Evaluation Patient Details Name: Diamond [name] MRN: [id number] DOB: 06-29-1951 Today's Date: 11/06/2019    History of Present Illness Pt is a 68 y.o.femalewith PMHx including recent COVID infection diagnosed 10/16/2019 (found to have AKI during this admission),HTN, well-controlled asthma, CKD, gout who presented withprogressive generalized weakness, swelling in L leg/foot and inability to ambulate. Admitted for further work-up.    Clinical Impression   This 69 y/o female presents with the above. Prior to initial admission in 10/2019 pt reports independence with ADL/mobility. Pt reports leading up to this admission feeling progressively weaker and with increased pain requiring use of wheelchair for mobility tasks. Pt presenting with weakness, pain, decreased standing balance impacting her functional performance. She currently requires maxA for bed mobility, two person assist to stand at [facility] however unable to take steps due to pain/weakness. Pt currently requires totalA for LB ADL, minA for seated UB ADL. She will benefit from continued acute OT services and recommend follow up therapy services in SNF setting after discharge to progress her towards her PLOF.     Follow Up Recommendations  SNF    Equipment Recommendations  Other (comment)(TBD in next venue)    Recommendations for Other Services       Precautions / Restrictions Precautions Precautions: Fall Restrictions Weight Bearing Restrictions: No      Mobility Bed Mobility Overal bed mobility: Needs Assistance Bed Mobility: Supine to Sit;Sit to Supine     Supine to sit: Max assist Sit to supine: Max assist;+2 for physical assistance;+2 for safety/equipment   General bed mobility comments: requires assist for LEs to/from EOB and for trunk elevation; increased time/effort to perform   Transfers Overall transfer level: Needs assistance Equipment used: Rolling walker (2 wheeled) Transfers: Sit to/from  Stand Sit to Stand: Max assist;+2 physical assistance;+2 safety/equipment;From elevated surface         General transfer comment: elevated EOB and required significant boosting assist to RW, increased time/effort to initiate mobility task; once in standing pt with LEs locked out and maintaining forward flexed posture over RW, unable to achieve upright standing position; attempted to side step along EOB, pt able to step LLE but unable to advance RLE, returned to sitting and pt able to lateral scoot towards [facility] with +2 assist    Balance Overall balance assessment: Needs assistance Sitting-balance support: Feet supported Sitting balance-Leahy Scale: Fair     Standing balance support: Bilateral upper extremity supported Standing balance-Leahy Scale: Poor Standing balance comment: UE support/external assist                           ADL either performed or assessed with clinical judgement   ADL Overall ADL's : Needs assistance/impaired Eating/Feeding: Set up;Sitting   Grooming: Min guard;Sitting   Upper Body Bathing: Minimal assistance;Sitting   Lower Body Bathing: Maximal assistance;+2 for physical assistance;+2 for safety/equipment;Sit to/from stand   Upper Body Dressing : Minimal assistance;Sitting   Lower Body Dressing: Maximal assistance;Total assistance;+2 for physical assistance;+2 for safety/equipment;Sit to/from stand Lower Body Dressing Details (indicate cue type and reason): MaxA+2 to stand; totalA to don socks              Functional mobility during ADLs: Maximal assistance;+2 for physical assistance;+2 for safety/equipment;Rolling walker General ADL Comments: pt limited due to pain, weakness     Vision         Perception     Praxis      Pertinent Vitals/Pain Pain Assessment:  Faces Faces Pain Scale: Hurts even more Pain Location: LLE>RLE Pain Descriptors / Indicators: Discomfort;Grimacing;Guarding Pain Intervention(s): Limited activity within  patient's tolerance;Monitored during session;Repositioned     Hand Dominance     Extremity/Trunk Assessment Upper Extremity Assessment Upper Extremity Assessment: Generalized weakness   Lower Extremity Assessment Lower Extremity Assessment: Defer to PT evaluation       Communication Communication Communication: No difficulties   Cognition Arousal/Alertness: Awake/alert Behavior During Therapy: WFL for tasks assessed/performed Overall Cognitive Status: No family/caregiver present to determine baseline cognitive functioning                                 General Comments: pt with some confusion answering questions related to events leading up to this admission (since previous admissino)   General Comments       Exercises     Shoulder Instructions      Home Living Family/patient expects to be discharged to:: Private residence Living Arrangements: Alone Available Help at Discharge: Family;Available PRN/intermittently Type of Home: House Home Access: Stairs to enter [employer] of Steps: 8 at front, 3 at back   Home Layout: One level     Bathroom Shower/Tub: Teacher, early years/pre: Standard     Home Equipment: Civil engineer, contracting;Wheelchair - manual          Prior Functioning/Environment Level of Independence: Independent        Comments: prior to initial admit with COVID pt reports independence with ADL/mobility; since COVID dx pt has become progressively weaker and required use of w/c leading up to admission        OT Problem List: Decreased strength;Decreased range of motion;Decreased activity tolerance;Impaired balance (sitting and/or standing);Decreased knowledge of use of DME or AE;Pain;Obesity;Cardiopulmonary status limiting activity;Decreased cognition      OT Treatment/Interventions: Self-care/ADL training;Therapeutic exercise;Energy conservation;DME and/or AE instruction;Therapeutic activities;Cognitive  remediation/compensation;Patient/family education;Balance training    OT Goals(Current goals can be found in the care plan section) Acute Rehab OT Goals Patient Stated Goal: regain her independence OT Goal Formulation: With patient Time For Goal Achievement: 11/20/19 Potential to Achieve Goals: Good  OT Frequency: Min 2X/week   Barriers to D/C:            Co-evaluation PT/OT/SLP Co-Evaluation/Treatment: Yes(overlap with PT) Reason for Co-Treatment: For patient/therapist safety;To address functional/ADL transfers   OT goals addressed during session: ADL's and self-care      AM-PAC OT "6 Clicks" Daily Activity     Outcome Measure Help from another person eating meals?: A Little Help from another person taking care of personal grooming?: A Little Help from another person toileting, which includes using toliet, bedpan, or urinal?: Total Help from another person bathing (including washing, rinsing, drying)?: A Lot Help from another person to put on and taking off regular upper body clothing?: A Little Help from another person to put on and taking off regular lower body clothing?: Total 6 Click Score: 13   End of Session Equipment Utilized During Treatment: Rolling walker;Gait belt Nurse Communication: Mobility status  Activity Tolerance: Patient tolerated treatment well;Patient limited by pain Patient left: in bed;with call bell/phone within reach;with bed alarm set  OT Visit Diagnosis: Unsteadiness on feet (R26.81);Other abnormalities of gait and mobility (R26.89);Pain Pain - Right/Left: Left Pain - part of body: Knee;Leg;Ankle and joints of foot                Time: 8127-5170 OT Time Calculation (min): 29  min Charges:  OT General Charges $OT Visit: 1 Visit OT Evaluation $OT Eval Moderate Complexity: [place], OT [employer] Pager [phone] Office [phone]   Raymondo Band 11/06/2019, 3:49 PM

## 2019-11-06 NOTE — Progress Notes (Signed)
RN has spoken w/ Dr about pt's HR increasing, decreasing, and pausing. Dr. Is aware and told RN to continue on tele and meds.

## 2019-11-06 NOTE — Progress Notes (Signed)
Rehab Admissions Coordinator Note:  Patient was screened by Cleatrice Burke for appropriateness for an Inpatient Acute Rehab Consult per PT recs. OT recs SNF.   At this time, we are recommending Inpatient Rehab consult. I will place order and CIR Admissions Coordinator will follow up on this case tomorrow.  Cleatrice Burke RN MSN 11/06/2019, 6:30 PM  I can be reached at [phone].

## 2019-11-06 NOTE — Consult Note (Signed)
Diamond [name] Admit Date: 11/05/2019 11/06/2019 Rexene Agent Requesting Physician:  Eligah East MD  Reason for Consult:  AoCKD or progressive CKD HPI:  68 year old female who presented yesterday evening with weakness and swelling/tenderness of the left lower extremity.  Her past history includes recent Covid infection last month, hypertension on atenolol/verapamil/hydrochlorothiazide, gout, CKD.  She sees Dr. Marval Regal in our office, last visit 12/2018 when her creatinine was 2.1.  Etiology of CKD is thought related to hypertension and use of NSAIDs.  Initial work-up was revealing for a negative lower extremity Doppler study.  Her creatinine was 5.2 upon presentation.  On 11/15 it was 2.4 but she had some AKI during her Covid infection with a peak creatinine just around 4.  11/12 renal ultrasound with increased echogenicity bilaterally but no acute structural issues.  Urine analysis upon presentation without hematuria/pyuria/proteinuria.  Patient's potassium is normal, she has a metabolic acidosis with a bicarbonate of 14 and anion gap of 16.  She also has anemia with some features of iron deficiency and has had received IV Feraheme.  She denies recent use of NSAIDs.  She does not receive any RAAS inhibitors.  No recent antibiotics.  She does endorse frequent diarrhea, but says that she has been able to keep up with her oral intake.  Overnight, she was treated with prednisone for her gout and notes improvement in her pain.  This morning she denies nausea/vomiting, anorexia, dysgeusia, itching/hiccups.    Creatinine, Ser (mg/dL)  Date Value  11/06/2019 4.87 (H)  11/05/2019 5.03 (H)  11/05/2019 4.80 (H)  11/05/2019 5.49 (H)  10/21/2019 2.37 (H)  10/20/2019 2.50 (H)  10/19/2019 2.86 (H)  10/18/2019 3.13 (H)  10/17/2019 3.82 (H)  10/16/2019 3.78 (H)  ] I/Os: I/O last 3 completed shifts: In: 654.1 [P.O.:240; I.V.:414.1] Out: -    ROS NSAIDS denies exposure IV Contrast no  exposure TMP/SMX denies exposure Hypotension not present Balance of 12 systems is negative w/ exceptions as above  PMH  Past Medical History:  Diagnosis Date  . Asthma   . Gout   . Hypertension    PSH  Past Surgical History:  Procedure Laterality Date  . CESAREAN SECTION  74   FH  Family History  Problem Relation Age of Onset  . Hypertension Mother   . Hypertension Father   . Colon cancer Brother   . Lung cancer Brother    SH  reports that she has never smoked. She has never used smokeless tobacco. She reports that she does not drink alcohol or use drugs. Allergies No Known Allergies Home medications Prior to Admission medications   Medication Sig Start Date End Date Taking? Authorizing Provider  albuterol (VENTOLIN HFA) 108 (90 Base) MCG/ACT inhaler Inhale 2 puffs into the lungs every 4 (four) hours as needed for wheezing or shortness of breath. 10/08/19  Yes [provider]  apixaban (ELIQUIS) 5 MG TABS tablet Take 1 tablet (5 mg total) by mouth 2 (two) times daily. 10/21/19  Yes Patrecia Pour, MD  atenolol (TENORMIN) 25 MG tablet Take 25 mg by mouth daily.   Yes [provider]  Fluticasone-Salmeterol (ADVAIR DISKUS) 250-50 MCG/DOSE AEPB Inhale 1 puff into the lungs 2 (two) times daily.    Yes [provider]  verapamil (CALAN) 120 MG tablet Take 120 mg by mouth 3 (three) times daily.   Yes [provider]    Current Medications Scheduled Meds: . apixaban  5 mg Oral BID  . metoprolol tartrate  25 mg Oral BID  . mometasone-formoterol  2 puff Inhalation BID  . predniSONE  40 mg Oral Q breakfast  . verapamil  120 mg Oral TID   Continuous Infusions: PRN Meds:.acetaminophen **OR** acetaminophen, albuterol, HYDROcodone-acetaminophen, labetalol, ondansetron **OR** ondansetron (ZOFRAN) IV  CBC Recent Labs  Lab 11/05/19 1455 11/05/19 2357  WBC 7.6 7.3  NEUTROABS 6.4  --   HGB 8.6* 7.7*  HCT 28.7* 24.2*  MCV 107.9* 102.1*  PLT  101* 562*   Basic Metabolic Panel Recent Labs  Lab 11/05/19 1455 11/05/19 2118 11/05/19 2357 11/06/19 0742  NA 137 140 139 143  K 5.2* 3.8 4.2 4.5  CL 107 114* 110 113*  CO2 15* 16* 17* 14*  GLUCOSE 111* 94 137* 109*  BUN 68* 62* 66* 65*  CREATININE 5.49* 4.80* 5.03* 4.87*  CALCIUM 9.2 8.2* 9.0 8.8*  PHOS  --   --  6.7*  --     Physical Exam  Blood pressure 103/80, pulse 98, temperature 99.5 F (37.5 C), temperature source Oral, resp. rate 18, SpO2 100 %. GEN: NAD ENT: NCAT EYES: EOMI, clear sclera CV: Regular, no murmur or rub PULM: Clear bilaterally, normal work of breathing ABD: Soft, nontender SKIN: No rashes or lesions EXT: 1-2+ left lower extremity edema; very little, trace at best, edema in the right leg. NEURO: Nonfocal  Assessment 49F with CKD3 and recent worsening after COVID19 10/2019 and upon presentation.  1. Renal Failure, suspect this is more subacute/chronic than acute.  No evidence of GN.  Recent imaging negative for acute structural issues.  Perhaps recent diarrhea has led to some hypovolemia and prerenal presentation.  I suspect we will be looking at advanced CKD when she stabilizes.  Thankfully, no uremia, and no indication to initiate dialysis.  Given her absence of symptoms, she is struggling with acknowledging the presence of CKD. 2. Metabolic acidosis, mildly increased anion gap; likely related to her CKD, question contribution of diarrhea 3. Recent Covid infection 4. Hypertension, stable 5. Gout, ?cause of left foot pain and swelling 6. Anemia, some iron deficiency, received Feraheme during this admission  Plan 1. I think she is fairly volume replete, unless diarrhea worsen/recurs would hold IV fluids and if necessary would use LR for bicarbonate based fluids 2. If acidosis persist will consider use of oral sodium bicarbonate 3. Daily weights, Daily Renal Panel, Strict I/Os, Avoid nephrotoxins (NSAIDs, judicious IV Contrast) 4. We will slowly  follow along, hopefully kidney function will stabilize/improve with measures already put into place.   Rexene Agent  [phone] 11/06/2019, 1:19 PM

## 2019-11-06 NOTE — Progress Notes (Signed)
  Echocardiogram 2D Echocardiogram has been performed.  Diamond [name] 11/06/2019, 2:48 PM

## 2019-11-06 NOTE — Progress Notes (Addendum)
PROGRESS NOTE    Diamond [name]  FXJ:[id number] DOB: [birth year] DOA: 11/05/2019 PCP: [name], MD   Brief Narrative:  Per HPI: Diamond [name] is a 68 y.o. female with medical history significant of recent COVID infection diagnosed 10/16/2019 HTN, well-controlled asthma, CKD 3A stage, gout  Presented with progressive generalized weakness as well as noted that her left leg and foot started to swell she has not been able to ambulate for the past few days secondary to significant swelling in her left leg. Denies  shortness of breath   HR 110 BP 158/100  97% on RA She had to use wheelchair No back pain No fever no cough Continues to have diarrhea Denies any change in her urine or urine output And straining to urinate No recent bleeding no falls She has been continues to take her medication  Patient reports she has history of gout in the past which usually been treated with colchicine  During admission for treatment of Covid 19 infection she was found to have AKI with creatinine up to 3.82   Baseline 2.2  felt to be related to COVID-19 infection.  Patient was admitted to [facility] was given remdesivir and steroids hospital stay complicated by atrial fibrillation with controlled ventricular rate and converted to normal sinus rhythm she was treated with verapamil and atenolol which is her home medications.  Patient was initially treated with IV heparin but then transitioned to Eliquis. Her renal function has improved at the time of discharge At the time of discharge per nephrology she for to have had chronic CKD 3 a stage B of AKI secondary to COVID-19 infection  12/1: Patient seen and evaluated this morning.  Her creatinine remains in the four-point 8-5 range with baseline recently noted to be 2.2.  Fractional excretion of sodium calculation at 2.6% signifying intrinsic causes.  Discussed with nephrology who will evaluate the patient today.  Continue on IV fluid.  Covid testing has  returned negative.  Assessment & Plan:   Active Problems:   Hypertension   Gout   AKI (acute kidney injury) ([place])   Anemia of chronic disease   Pneumonia due to COVID-19 virus   Cardiomegaly   Hyperkalemia   AKI on CKD stage IIIb -FENa 2.6% we will have nephrology evaluate, consultation appreciated -Continue IV fluid, patient appears to be nonoliguric -Mild acidemia noted with no hyperkalemia -Follow a.m. labs  Afib -Currently rate-controlled -Will monitor on tele -Continue metoprolol and verapamil -2D echo pending -TSH 2.26 -Chads2Vasc is 3; pt already on Eliquis for same  Acute on chronic gout -Continue prednisone -PT evaluation for ambulation by tomorrow  Hypertension -Resume home Cardizem and maintain on metoprolol and avoid atenolol due to issues with renal clearance -Currently with some intermittent tachycardia  Anemia of chronic disease -Likely chronic and will monitor CBC in a.m. -Anemia panel with some iron deficiency and will replete with Feraheme today  Cardiomegaly -2D echocardiogram to be evaluated   DVT prophylaxis: Eliquis Code Status: Full code Family Communication: None at bedside Disposition Plan: [place] nephrology evaluation.  Continue IV fluid for now.   Consultants:   Nephrology, discussed with Dr. Joelyn Oms  Procedures:   None  Antimicrobials:  Anti-infectives (From admission, onward)   None       Subjective: Patient seen and evaluated today with no new acute complaints or concerns. No acute concerns or events noted overnight.  Objective: Vitals:   11/05/19 2228 11/06/19 0258 11/06/19 0456 11/06/19 0800  BP: (!) 163/107 (!) 157/120 Marland Kitchen)  173/111 (!) 158/106  Pulse: 97 93 (!) 102 91  Resp: 18 20 18    Temp: 98.9 F (37.2 C) 99.1 F (37.3 C) 98 F (36.7 C) 99.5 F (37.5 C)  TempSrc: Oral Oral  Oral  SpO2:  99% 100% 100%    Intake/Output Summary (Last 24 hours) at 11/06/2019 0940 Last data filed at 11/06/2019  6644 Gross per 24 hour  Intake 1093.32 ml  Output --  Net 1093.32 ml   There were no vitals filed for this visit.  Examination:  General exam: Appears calm and comfortable  Respiratory system: Clear to auscultation. Respiratory effort normal.  Currently on minimal nasal cannula. Cardiovascular system: S1 & S2 heard, RRR. No JVD, murmurs, rubs, gallops or clicks. No pedal edema. Gastrointestinal system: Abdomen is nondistended, soft and nontender. No organomegaly or masses felt. Normal bowel sounds heard. Central nervous system: Alert and oriented. No focal neurological deficits. Extremities: Symmetric 5 x 5 power. Skin: No rashes, lesions or ulcers Psychiatry: Judgement and insight appear normal. Mood & affect appropriate.  Urine canister with yellow, clear urine output noted.    Data Reviewed: I have personally reviewed following labs and imaging studies  CBC: Recent Labs  Lab 11/05/19 1455 11/05/19 2357  WBC 7.6 7.3  NEUTROABS 6.4  --   HGB 8.6* 7.7*  HCT 28.7* 24.2*  MCV 107.9* 102.1*  PLT 101* 034*   Basic Metabolic Panel: Recent Labs  Lab 11/05/19 1455 11/05/19 1644 11/05/19 2118 11/05/19 2357 11/06/19 0742  NA 137  --  140 139 143  K 5.2*  --  3.8 4.2 4.5  CL 107  --  114* 110 113*  CO2 15*  --  16* 17* 14*  GLUCOSE 111*  --  94 137* 109*  BUN 68*  --  62* 66* 65*  CREATININE 5.49*  --  4.80* 5.03* 4.87*  CALCIUM 9.2  --  8.2* 9.0 8.8*  MG  --  2.0  --  2.0  --   PHOS  --   --   --  6.7*  --    GFR: CrCl cannot be calculated (Unknown ideal weight.). Liver Function Tests: Recent Labs  Lab 11/05/19 1455 11/05/19 2357  AST 37 19  ALT 26 25  ALKPHOS 60 59  BILITOT 0.9 0.4  PROT 7.5 7.0  ALBUMIN 2.4* 2.2*   No results for input(s): LIPASE, AMYLASE in the last 168 hours. No results for input(s): AMMONIA in the last 168 hours. Coagulation Profile: No results for input(s): INR, PROTIME in the last 168 hours. Cardiac Enzymes: Recent Labs  Lab  11/05/19 1644  CKTOTAL 158   BNP (last 3 results) No results for input(s): PROBNP in the last 8760 hours. HbA1C: No results for input(s): HGBA1C in the last 72 hours. CBG: No results for input(s): GLUCAP in the last 168 hours. Lipid Profile: No results for input(s): CHOL, HDL, LDLCALC, TRIG, CHOLHDL, LDLDIRECT in the last 72 hours. Thyroid Function Tests: Recent Labs    11/05/19 2357  TSH 2.260   Anemia Panel: Recent Labs    11/05/19 2357 11/06/19 0402  VITAMINB12  --  222  FOLATE  --  14.1  FERRITIN 756* 290  TIBC  --  147*  IRON  --  15*  RETICCTPCT  --  1.4   Sepsis Labs: Recent Labs  Lab 11/05/19 1455 11/05/19 1830 11/05/19 2118 11/05/19 2357  LATICACIDVEN 2.3* 2.3* 2.4* 1.3    Recent Results (from the past 240 hour(s))  Culture, blood (  routine x 2)     Status: None (Preliminary result)   Collection Time: 11/05/19  4:13 PM   Specimen: BLOOD RIGHT HAND  Result Value Ref Range Status   Specimen Description   Final    BLOOD RIGHT HAND Performed at [facility] [address] [place], [place] [address]    Special Requests   Final    BOTTLES DRAWN AEROBIC ONLY Blood Culture adequate volume Performed at [facility] [address] [place], [place] [address]    Culture   Final    NO GROWTH < 24 HOURS Performed at [facility] [address] [place], [place] [address]    Report Status PENDING  Incomplete  SARS CORONAVIRUS 2 (TAT 6-24 HRS) Nasopharyngeal Nasopharyngeal Swab     Status: None   Collection Time: 11/05/19  9:52 PM   Specimen: Nasopharyngeal Swab  Result Value Ref Range Status   SARS Coronavirus 2 NEGATIVE NEGATIVE Final    Comment: (NOTE) SARS-CoV-2 target nucleic acids are NOT DETECTED. The SARS-CoV-2 RNA is generally detectable in upper and lower respiratory specimens during the acute phase of infection. Negative results do not preclude SARS-CoV-2 infection, do not rule out co-infections  with other pathogens, and should not be used as the sole basis for treatment or other patient management decisions. Negative results must be combined with clinical observations, patient history, and epidemiological information. The expected result is Negative. Fact Sheet for Patients: SugarRoll.be Fact Sheet for Healthcare Providers: https://www.woods-mathews.com/ This test is not yet approved or cleared by the [place] FDA and  has been authorized for detection and/or diagnosis of SARS-CoV-2 by FDA under an Emergency Use Authorization (EUA). This EUA will remain  in effect (meaning this test can be used) for the duration of the COVID-19 declaration under Section 56 4(b)(1) of the Act, 21 U.S.C. section 360bbb-3(b)(1), unless the authorization is terminated or revoked sooner. Performed at [facility], [place] [address] [place], [place] [address]          Radiology Studies: Dg Ankle Complete Left  Result Date: 11/05/2019 CLINICAL DATA:  Pain EXAM: LEFT ANKLE COMPLETE - 3+ VIEW COMPARISON:  None. FINDINGS: There is nonspecific soft tissue swelling about the ankle without evidence for an acute displaced fracture or dislocation. The mortise joint is preserved. A moderate-sized plantar calcaneal spur is noted. Calcifications are noted within the plantar fascia. IMPRESSION: 1. Soft tissue swelling without evidence for acute bony abnormality. 2. Plantar calcaneal spur. 3. Calcifications within the plantar fascia. Electronically Signed   By: Constance Holster M.D.   On: 11/05/2019 17:43   Dg Chest Port 1 View  Result Date: 11/05/2019 CLINICAL DATA:  Leg swelling EXAM: PORTABLE CHEST 1 VIEW COMPARISON:  None. FINDINGS: There arm proving bibasilar and peripheral airspace opacities. There is no pneumothorax. No large pleural effusion. The heart size remains enlarged. There is some vascular congestion without overt pulmonary edema. There is no  acute osseous abnormality. IMPRESSION: 1. Improving bibasilar airspace opacities. Continued follow-up to resolution is recommended. 2. Cardiomegaly with vascular congestion. No overt pulmonary edema or large pleural effusion. Electronically Signed   By: Constance Holster M.D.   On: 11/05/2019 20:19   Dg Foot Complete Left  Result Date: 11/05/2019 CLINICAL DATA:  Foot pain. EXAM: LEFT FOOT - COMPLETE 3+ VIEW COMPARISON:  None. FINDINGS: Degenerative changes and erosions are noted at the first metatarsophalangeal joint. There is surrounding soft tissue swelling. There is no acute displaced fracture. No  dislocation. Degenerative changes are noted of the midfoot. There is a moderate-sized plantar calcaneal spur. Calcifications are noted within the plantar fascia. There is no acute displaced fracture or dislocation. IMPRESSION: 1. Findings compatible with gout at the first metatarsophalangeal joint. 2. No acute displaced fracture or dislocation. 3. Degenerative changes of the midfoot. 4. Moderate-sized plantar calcaneal spur. 5. Calcifications within the plantar fascia may be secondary to underlying plantar fasciitis in the appropriate clinical setting. Clinical correlation is recommended. Electronically Signed   By: Constance Holster M.D.   On: 11/05/2019 17:42   Dg Foot Complete Right  Result Date: 11/05/2019 CLINICAL DATA:  Bilateral foot pain. EXAM: RIGHT FOOT COMPLETE - 3+ VIEW COMPARISON:  09/06/2015 FINDINGS: There are advanced degenerative changes of metatarsophalangeal joint of the first digit. There is surrounding soft tissue swelling. There may be some mild associated erosions. These findings have progressed since 2016. There is a moderate-sized plantar calcaneal spur. Calcifications are noted in the region of the plantar fascia at the level of the hindfoot. Midfoot degenerative changes are noted. There is no acute displaced fracture or dislocation. IMPRESSION: 1. Advanced degenerative changes of  the metatarsophalangeal joint of the first digit with surrounding soft tissue swelling. There may be some mild associated erosions. These findings have progressed since 2016 inter suspicious for gout. 2. Calcifications within the plantar fascia can be seen in patients with plantar fasciitis. 3. No acute displaced fracture or dislocation. Electronically Signed   By: Constance Holster M.D.   On: 11/05/2019 17:41   Vas [place] Lower Extremity Venous (dvt) (mc And Wl 7a-7p)  Result Date: 11/06/2019  Lower Venous Study Indications: Swelling.  Risk Factors: None identified. Limitations: Poor ultrasound/tissue interface and body habitus. Comparison Study: No prior stuides. Performing Technologist: Oliver Hum RVT  Examination Guidelines: A complete evaluation includes B-mode imaging, spectral Doppler, color Doppler, and power Doppler as needed of all accessible portions of each vessel. Bilateral testing is considered an integral part of a complete examination. Limited examinations for reoccurring indications may be performed as noted.  +-----+---------------+---------+-----------+----------+--------------+  RIGHT Compressibility Phasicity Spontaneity Properties Thrombus Aging  +-----+---------------+---------+-----------+----------+--------------+  CFV   Full            Yes       Yes                                    +-----+---------------+---------+-----------+----------+--------------+   +---------+---------------+---------+-----------+----------+--------------+  LEFT      Compressibility Phasicity Spontaneity Properties Thrombus Aging  +---------+---------------+---------+-----------+----------+--------------+  CFV       Full            Yes       Yes                                    +---------+---------------+---------+-----------+----------+--------------+  SFJ       Full                                                             +---------+---------------+---------+-----------+----------+--------------+  FV  Prox   Full                                                             +---------+---------------+---------+-----------+----------+--------------+  FV Mid    Full                                                             +---------+---------------+---------+-----------+----------+--------------+  FV Distal Full                                                             +---------+---------------+---------+-----------+----------+--------------+  PFV       Full                                                             +---------+---------------+---------+-----------+----------+--------------+  POP       Full            Yes       Yes                                    +---------+---------------+---------+-----------+----------+--------------+  PTV       Full                                                             +---------+---------------+---------+-----------+----------+--------------+  PERO      Full                                                             +---------+---------------+---------+-----------+----------+--------------+     Summary: Right: No evidence of common femoral vein obstruction. Left: There is no evidence of deep vein thrombosis in the lower extremity. No cystic structure found in the popliteal fossa.  *See table(s) above for measurements and observations. Electronically signed by Deitra Mayo MD on 11/06/2019 at 9:24:59 AM.    Final         Scheduled Meds:  apixaban  5 mg Oral BID   atenolol  25 mg Oral Daily   metoprolol tartrate  25 mg Oral BID   mometasone-formoterol  2 puff Inhalation BID   predniSONE  40 mg Oral Q breakfast   verapamil  120 mg Oral TID   Continuous Infusions:   LOS: 1 day    Time spent: 30 minutes    Chee Kinslow Darleen Crocker, DO [facility] Pager [phone]  If 7PM-7AM, please contact night-coverage [email or web address] Password [email or web address] 11/06/2019, 9:40 AM

## 2019-11-06 NOTE — Evaluation (Signed)
Physical Therapy Evaluation Patient Details Name: Diamond [name] MRN: [id number] DOB: 12/20/1950 Today's Date: 11/06/2019   History of Present Illness  Pt is a 68 y.o. female with PMHx including recent COVID infection diagnosed 10/16/2019 (found to have AKI and gout during this admission), HTN, well-controlled asthma, CKD, gout who presented with progressive generalized weakness, swelling in L leg/foot and inability to ambulate. Admitted for further work-up.  Clinical Impression   Pt admitted with above diagnosis. Pt was very independent and active prior to recent COVID infection and had began to recover her strength until readmitted with AKI and gout.  She now presents with significant weakness requiring mod-max A x 2 for transfers and gait.   Additionally, difficulty with walking due to pain from gout.   Pt does have mild confusion needing increased cues and questions repeated at times.  Pt very motivated and with good rehab potential as she was very mobile and active before recent admissions.  (COVID contact precautions had been removed earlier in the day) Pt currently with functional limitations due to the deficits listed below (see PT Problem List). Pt will benefit from skilled PT to increase their independence and safety with mobility to allow discharge to the venue listed below.       Follow Up Recommendations CIR;SNF    Equipment Recommendations  None recommended by PT    Recommendations for Other Services       Precautions / Restrictions Precautions Precautions: Fall Restrictions Weight Bearing Restrictions: No      Mobility  Bed Mobility Overal bed mobility: Needs Assistance Bed Mobility: Supine to Sit;Sit to Supine     Supine to sit: Max assist;HOB elevated Sit to supine: Max assist;+2 for physical assistance;+2 for safety/equipment   General bed mobility comments: requires assist for LEs to/from EOB and for trunk elevation; increased time/effort to perform    Transfers Overall transfer level: Needs assistance Equipment used: Rolling walker (2 wheeled) Transfers: Sit to/from Stand Sit to Stand: Max assist;+2 physical assistance;+2 safety/equipment;From elevated surface         General transfer comment: elevated EOB and required significant boosting assist to RW, increased time/effort to initiate mobility task; once in standing pt with LEs locked out and maintaining forward flexed posture over RW, unable to achieve upright standing position despite cues and assist for posture; attempted to side step along EOB, pt able to step LLE but unable to advance RLE, returned to sitting and pt able to lateral scoot towards HOB with +2 assist  Ambulation/Gait Ambulation/Gait assistance: Max assist;+2 physical assistance Gait Distance (Feet): 1 Feet Assistive device: Rolling walker (2 wheeled) Gait Pattern/deviations: Trunk flexed     General Gait Details: elbows on walker, trunk flexed, only able to advance L LE side step toward [facility]  Stairs            Wheelchair Mobility    Modified Rankin (Stroke Patients Only)       Balance Overall balance assessment: Needs assistance Sitting-balance support: Feet supported;Bilateral upper extremity supported Sitting balance-Leahy Scale: Fair Sitting balance - Comments: leans on elbows at times; fatigues easily   Standing balance support: Bilateral upper extremity supported Standing balance-Leahy Scale: Poor Standing balance comment: UE support/external assist                             Pertinent Vitals/Pain Pain Assessment: 0-10 Faces Pain Scale: Hurts even more Pain Location: LLE>RLE Pain Descriptors / Indicators: Discomfort;Grimacing;Guarding Pain Intervention(s): Limited  activity within patient's tolerance;Repositioned    Home Living Family/patient expects to be discharged to:: Private residence Living Arrangements: Alone Available Help at Discharge: Family;Available  PRN/intermittently Type of Home: House Home Access: Stairs to enter   [employer] of Steps: 8 at front, 3 at back Home Layout: One level Home Equipment: Shower seat;Wheelchair - manual      Prior Function Level of Independence: Independent         Comments: prior to initial admit with COVID pt reports independence with ADL/mobility; For COVID, pt reports she was at [facility] for a few days but d/c home and was able to walk; she then became progressively weaker and required use of w/c leading up to admission     Hand Dominance        Extremity/Trunk Assessment   Upper Extremity Assessment Upper Extremity Assessment: Defer to OT evaluation    Lower Extremity Assessment Lower Extremity Assessment: LLE deficits/detail;RLE deficits/detail RLE Deficits / Details: ROM WFL;  Strength 3-/5 (required increase time and effort for AROM) LLE Deficits / Details: ROM WFL;  Strength 3-/5 (required increase time and effort for AROM)    Cervical / Trunk Assessment Cervical / Trunk Assessment: Normal  Communication   Communication: No difficulties  Cognition Arousal/Alertness: Awake/alert Behavior During Therapy: WFL for tasks assessed/performed Overall Cognitive Status: No family/caregiver present to determine baseline cognitive functioning                                 General Comments: pt with some confusion answering questions related to events leading up to this admission (since previous admission.); Additionally, she asked "why am I so weak?"  about 1 minute after finished explaining that COVID, gout, and AKI can cause weakness      General Comments      Exercises     Assessment/Plan    PT Assessment Patient needs continued PT services  PT Problem List Decreased strength;Decreased mobility;Decreased safety awareness;Decreased activity tolerance;Cardiopulmonary status limiting activity;Decreased balance       PT Treatment  Interventions DME instruction;Therapeutic activities;Cognitive remediation;Gait training;Therapeutic exercise;Patient/family education;Stair training;Balance training;Functional mobility training;Neuromuscular re-education    PT Goals (Current goals can be found in the Care Plan section)  Acute Rehab PT Goals Patient Stated Goal: regain her independence; open to rehab at d/c PT Goal Formulation: With patient Time For Goal Achievement: 11/20/19 Potential to Achieve Goals: Good Additional Goals Additional Goal #1: Bil LE strength to 4/5 for improved sit to stand    Frequency Min 3X/week   Barriers to discharge Decreased caregiver support      Co-evaluation PT/OT/SLP Co-Evaluation/Treatment: Yes Reason for Co-Treatment: For patient/therapist safety;To address functional/ADL transfers PT goals addressed during session: Mobility/safety with mobility;Strengthening/ROM OT goals addressed during session: ADL's and self-care       AM-PAC PT "6 Clicks" Mobility  Outcome Measure Help needed turning from your back to your side while in a flat bed without using bedrails?: A Lot Help needed moving from lying on your back to sitting on the side of a flat bed without using bedrails?: A Lot Help needed moving to and from a bed to a chair (including a wheelchair)?: Total Help needed standing up from a chair using your arms (e.g., wheelchair or bedside chair)?: Total Help needed to walk in hospital room?: Total Help needed climbing 3-5 steps with a railing? : Total 6 Click Score: 8    End of Session Equipment  Utilized During Treatment: Gait belt Activity Tolerance: Patient limited by fatigue Patient left: in bed;with bed alarm set;with call bell/phone within reach Nurse Communication: Mobility status PT Visit Diagnosis: Other abnormalities of gait and mobility (R26.89);Unsteadiness on feet (R26.81);Muscle weakness (generalized) (M62.81)    Time: 8333-8329 PT Time Calculation (min) (ACUTE  ONLY): 24 min   Charges:   PT Evaluation $PT Eval Moderate Complexity: 1 Mod          Maggie Font, PT Acute Rehab Services Pager [phone] [facility] [phone] [facility] [place] 11/06/2019, 5:13 PM

## 2019-11-07 DIAGNOSIS — N179 Acute kidney failure, unspecified: Principal | ICD-10-CM

## 2019-11-07 DIAGNOSIS — I10 Essential (primary) hypertension: Secondary | ICD-10-CM

## 2019-11-07 DIAGNOSIS — M1A9XX0 Chronic gout, unspecified, without tophus (tophi): Secondary | ICD-10-CM

## 2019-11-07 DIAGNOSIS — D638 Anemia in other chronic diseases classified elsewhere: Secondary | ICD-10-CM

## 2019-11-07 LAB — BASIC METABOLIC PANEL
Anion gap: 13 (ref 5–15)
BUN: 76 mg/dL — ABNORMAL HIGH (ref 8–23)
CO2: 14 mmol/L — ABNORMAL LOW (ref 22–32)
Calcium: 9.4 mg/dL (ref 8.9–10.3)
Chloride: 111 mmol/L (ref 98–111)
Creatinine, Ser: 4.6 mg/dL — ABNORMAL HIGH (ref 0.44–1.00)
GFR calc Af Amer: 11 mL/min — ABNORMAL LOW (ref >60)
GFR calc non Af Amer: 9 mL/min — ABNORMAL LOW (ref >60)
Glucose, Bld: 114 mg/dL — ABNORMAL HIGH (ref 70–99)
Potassium: 4.7 mmol/L (ref 3.5–5.1)
Sodium: 138 mmol/L (ref 135–145)

## 2019-11-07 LAB — CBC
HCT: 22.5 % — ABNORMAL LOW (ref 36.0–46.0)
Hemoglobin: 7 g/dL — ABNORMAL LOW (ref 12.0–15.0)
MCH: 32 pg (ref 26.0–34.0)
MCHC: 31.1 g/dL (ref 30.0–36.0)
MCV: 102.7 fL — ABNORMAL HIGH (ref 80.0–100.0)
Platelets: 159 10*3/uL (ref 150–400)
RBC: 2.19 MIL/uL — ABNORMAL LOW (ref 3.87–5.11)
RDW: 17.1 % — ABNORMAL HIGH (ref 11.5–15.5)
WBC: 6.7 10*3/uL (ref 4.0–10.5)
nRBC: 0 % (ref 0.0–0.2)

## 2019-11-07 MED ORDER — SODIUM BICARBONATE 650 MG PO TABS
1300.0000 mg | ORAL_TABLET | Freq: Two times a day (BID) | ORAL | Status: DC
  Administered 2019-11-07 – 2019-11-12 (×12): 1300 mg via ORAL
  Filled 2019-11-07 (×12): qty 2

## 2019-11-07 NOTE — Progress Notes (Signed)
Admit: 11/05/2019 LOS: 2  66F with CKD3 and recent worsening after COVID19 10/2019 and upon presentation.  Subjective:  . No new events . L foot improved but still sore .  SCr improving, HCO3 14 with AG 13.   Marland Kitchen Hb 7s, rec Fereheme . No uremic Sx  12/01 0701 - 12/02 0700 In: 2527.2 [P.O.:1260; I.V.:439.2; IV Piggyback:468] Out: 1350 [Urine:1350]  Filed Weights   11/06/19 1353  Weight: 84.9 kg    Scheduled Meds: . apixaban  5 mg Oral BID  . metoprolol tartrate  25 mg Oral BID  . mometasone-formoterol  2 puff Inhalation BID  . predniSONE  40 mg Oral Q breakfast  . verapamil  120 mg Oral TID   Continuous Infusions: PRN Meds:.acetaminophen **OR** acetaminophen, albuterol, HYDROcodone-acetaminophen, labetalol, ondansetron **OR** ondansetron (ZOFRAN) IV  Current Labs: reviewed    Physical Exam:  Blood pressure (!) 169/110, pulse 95, temperature 97.8 F (36.6 C), temperature source Oral, resp. rate 18, height 5\' 7"  (1.702 m), weight 84.9 kg, SpO2 98 %. GEN: NAD ENT: NCAT EYES: EOMI, clear sclera CV: Regular, no murmur or rub PULM: Clear bilaterally, normal work of breathing ABD: Soft, nontender SKIN: No rashes or lesions EXT: 1-2+ left lower extremity edema; very little, trace at best, edema in the right leg. NEURO: Nonfocal  A 1. Renal Failure, suspect this is more subacute/chronic than acute.  No evidence of GN.  Recent imaging negative for acute structural issues.  Perhaps recent diarrhea has led to some hypovolemia and prerenal presentation.  I suspect we will be looking at advanced CKD when she stabilizes.  Thankfully, no uremia, and no indication to initiate dialysis.  Given her absence of symptoms, she is struggling with acknowledging the presence of CKD. 2. Metabolic acidosis, mildly increased anion gap; likely related to her CKD, question contribution of diarrhea 3. Recent Covid infection 4. Hypertension, stable 5. Gout, ?cause of left foot pain and  swelling 6. Anemia, some iron deficiency, received Feraheme during this admission  P . GFR stable to improving, K ok. Still acidotic, start NaHCO3 1300 BID . Monitor for edema on NaHCO3, might req some loop diuretic . Follow Hb post IV Fe, might req ESA as outpt . No further suggestions. Will s/o and arrange close f/u at [place] with Dr. Verta Ellen MD 11/07/2019, 12:41 PM  Recent Labs  Lab 11/05/19 2357 11/06/19 0742 11/07/19 0347  NA 139 143 138  K 4.2 4.5 4.7  CL 110 113* 111  CO2 17* 14* 14*  GLUCOSE 137* 109* 114*  BUN 66* 65* 76*  CREATININE 5.03* 4.87* 4.60*  CALCIUM 9.0 8.8* 9.4  PHOS 6.7*  --   --    Recent Labs  Lab 11/05/19 1455 11/05/19 2357 11/07/19 0347  WBC 7.6 7.3 6.7  NEUTROABS 6.4  --   --   HGB 8.6* 7.7* 7.0*  HCT 28.7* 24.2* 22.5*  MCV 107.9* 102.1* 102.7*  PLT 101* 111* 159

## 2019-11-07 NOTE — Progress Notes (Signed)
PROGRESS NOTE    Diamond [name]  HER:[id number] DOB: [birth year] DOA: 11/05/2019 PCP: [name], MD   Brief Narrative: 68 year old female with history of recent Covid infection 10/16/2019, essential hypertension, asthma, CKD stage III, gout presented with generalized weakness, left leg and foot swelling, not being able to ambulate due to swelling in the left leg. Patient already received a last admission were found to have, followed by nephrology, creatinine trended up to 3.8 baseline 2.2 felt to be COVID-19 infection had taken remdesivir steroids, hospital course complicated by A. fib that converted to NSR after verapamil and atenolol which is her home medication patient was on heparin then transition to Eliquis and renal function improved at the time of discharge. Patient notes history of gout treated with colchicine.  Subjective: Patient has no new complaints.  Resting comfortably. Overnight labs showed bicarb still low at 14, creatinine at 4.6 from 4.8.  hb at 7.0 g from 7.7-with MCV of 102-etiology lower side 222. On room air 98% blood pressure doing stable in 140s, noted yesterday 90s sbp.  Assessment & Plan:   AKI on CKD stage IIIb: Suspecting more subacute/chronic than acute as per nephrology.  No evidence of urine, recent imaging negative for acute structural problems.  Patient had recent diarrhea could have caused hypovolemia and prerenal presentation.  And patient for acute dialysis at this time.  Creatinine is still up 4.6, followed by nephrology.  Nephrology holding on IV fluids, encouraged oral intake.  Defer further plan to nephrology was following closely Recent Labs  Lab 11/05/19 1455 11/05/19 2118 11/05/19 2357 11/06/19 0742 11/07/19 0347  BUN 68* 62* 66* 65* 76*  CREATININE 5.49* 4.80* 5.03* 4.87* 4.60*    Anemia of chronic disease, with low ferritin status post Feraheme, B12 on lower side 222.  Discussed about blood transfusion if hemoglobin less than 7 g  patient does not seem to be interested today while above 7 g,We will revisit blood transfusion plan if hemoglobin less than 7 g.  Add B12 supplementation, check Hemoccult.  Reports she has never had EGD or colonoscopy previously.  But denies any melena or hematochezia.  Of note she is on Eliquis. Recent Labs  Lab 11/05/19 1455 11/05/19 2357 11/07/19 0347  HGB 8.6* 7.7* 7.0*  HCT 28.7* 85.6* 31.4*   Metabolic acidosis, current bicarb at 14.  Start bicarb if okay with nephrology.  Acute on chronic gout being treated with, and is on prednisone .  A. fib, rate controlled, on Eliquis, metoprolol and verapamil.  TSH stable.  Echo with EF 60 to 65%.  Hypertension: Blood pressure is stable.  On verapamil and metoprolol for A. fib.  Body mass index is 29.32 kg/m.   DVT prophylaxis: Eliquis  Code Status: full Family Communication: plan of care discussed with patient at bedside. Disposition Plan: Remains inpatient pending clinical improvement and APTT.  Has a history of CIR/SNF rehab has been consulted.  Consultants: nephro Procedures: 2D echocardiogram 12/1 1. Left ventricular ejection fraction, by visual estimation, is 60 to 65%. The left ventricle has normal function. There is no left ventricular hypertrophy.  2. Left ventricular diastolic parameters are indeterminate.  3. Global right ventricle has normal systolic function.The right ventricular size is normal. No increase in right ventricular wall thickness.  4. Left atrial size was mildly dilated.  5. Right atrial size was normal.  6. Mild mitral annular calcification.  7. Moderate thickening of the mitral valve leaflet(s).  8. The mitral valve is normal in structure. Mild mitral  valve regurgitation. No evidence of mitral stenosis.  9. The tricuspid valve is normal in structure. Tricuspid valve regurgitation moderate-severe. 10. The aortic valve is tricuspid. Aortic valve regurgitation is not visualized. Sclerosis. 11. There is  Moderate calcification of the aortic valve. 12. There is Moderate thickening of the aortic valve. 13. The pulmonic valve was grossly normal. Pulmonic valve regurgitation is mild. 14. Aortic dilatation noted. 15. There is mild dilatation of the aortic root measuring 39 mm. 16. Normal pulmonary artery systolic pressure. 17. The inferior vena cava is normal in size with greater than 50% respiratory variability, suggesting right atrial pressure of 3 mmHg.  Microbiology:  Antimicrobials: Anti-infectives (From admission, onward)   None       Objective: Vitals:   11/06/19 1042 11/06/19 1353 11/06/19 2148 11/07/19 0502  BP: 103/80 90/68 (!) 153/97 (!) 140/95  Pulse: 98 (!) 50 (!) 57 84  Resp:  16 18 18   Temp:  97.6 F (36.4 C) 98.3 F (36.8 C) 97.8 F (36.6 C)  TempSrc:  Oral Oral Oral  SpO2:  100% 96% 98%  Weight:  84.9 kg    Height:  5\' 7"  (1.702 m)      Intake/Output Summary (Last 24 hours) at 11/07/2019 0803 Last data filed at 11/06/2019 1800 Gross per 24 hour  Intake 1788 ml  Output 1200 ml  Net 588 ml   Filed Weights   11/06/19 1353  Weight: 84.9 kg   Weight change:   Body mass index is 29.32 kg/m.  Intake/Output from previous day: 12/01 0701 - 12/02 0700 In: 2227.2 [P.O.:960; I.V.:439.2; IV Piggyback:468] Out: 1200 [Urine:1200] Intake/Output this shift: No intake/output data recorded.  Examination:  General exam: AAOx3,NAD, Weak appearing. HEENT:Oral mucosa moist, Ear/Nose WNL grossly, dentition normal. Respiratory system: Diminished at the base,no wheezing or crackles,no use of accessory muscle Cardiovascular system: S1 & S2 +, No JVD,. Gastrointestinal system: Abdomen soft, NT,ND, BS+ Nervous System:Alert, awake, moving extremities and grossly nonfocal Extremities: Left lower  extremity edema, distal peripheral pulses palpable.  Skin: No rashes,no icterus. MSK: Normal muscle bulk,tone, power  Medications:  Scheduled Meds:  apixaban  5 mg Oral BID     metoprolol tartrate  25 mg Oral BID   mometasone-formoterol  2 puff Inhalation BID   predniSONE  40 mg Oral Q breakfast   verapamil  120 mg Oral TID   Continuous Infusions:  Data Reviewed: I have personally reviewed following labs and imaging studies  CBC: Recent Labs  Lab 11/05/19 1455 11/05/19 2357 11/07/19 0347  WBC 7.6 7.3 6.7  NEUTROABS 6.4  --   --   HGB 8.6* 7.7* 7.0*  HCT 28.7* 24.2* 22.5*  MCV 107.9* 102.1* 102.7*  PLT 101* 111* 981   Basic Metabolic Panel: Recent Labs  Lab 11/05/19 1455 11/05/19 1644 11/05/19 2118 11/05/19 2357 11/06/19 0742 11/07/19 0347  NA 137  --  140 139 143 138  K 5.2*  --  3.8 4.2 4.5 4.7  CL 107  --  114* 110 113* 111  CO2 15*  --  16* 17* 14* 14*  GLUCOSE 111*  --  94 137* 109* 114*  BUN 68*  --  62* 66* 65* 76*  CREATININE 5.49*  --  4.80* 5.03* 4.87* 4.60*  CALCIUM 9.2  --  8.2* 9.0 8.8* 9.4  MG  --  2.0  --  2.0  --   --   PHOS  --   --   --  6.7*  --   --  GFR: Estimated Creatinine Clearance: 12.9 mL/min (A) (by C-G formula based on SCr of 4.6 mg/dL (H)). Liver Function Tests: Recent Labs  Lab 11/05/19 1455 11/05/19 2357  AST 37 19  ALT 26 25  ALKPHOS 60 59  BILITOT 0.9 0.4  PROT 7.5 7.0  ALBUMIN 2.4* 2.2*   No results for input(s): LIPASE, AMYLASE in the last 168 hours. No results for input(s): AMMONIA in the last 168 hours. Coagulation Profile: No results for input(s): INR, PROTIME in the last 168 hours. Cardiac Enzymes: Recent Labs  Lab 11/05/19 1644  CKTOTAL 158   BNP (last 3 results) No results for input(s): PROBNP in the last 8760 hours. HbA1C: No results for input(s): HGBA1C in the last 72 hours. CBG: No results for input(s): GLUCAP in the last 168 hours. Lipid Profile: No results for input(s): CHOL, HDL, LDLCALC, TRIG, CHOLHDL, LDLDIRECT in the last 72 hours. Thyroid Function Tests: Recent Labs    11/05/19 2357  TSH 2.260   Anemia Panel: Recent Labs    11/05/19 2357  11/06/19 0402  VITAMINB12  --  222  FOLATE  --  14.1  FERRITIN 756* 290  TIBC  --  147*  IRON  --  15*  RETICCTPCT  --  1.4   Sepsis Labs: Recent Labs  Lab 11/05/19 1455 11/05/19 1830 11/05/19 2118 11/05/19 2357  LATICACIDVEN 2.3* 2.3* 2.4* 1.3    Recent Results (from the past 240 hour(s))  Culture, blood (routine x 2)     Status: None (Preliminary result)   Collection Time: 11/05/19  4:13 PM   Specimen: BLOOD RIGHT HAND  Result Value Ref Range Status   Specimen Description   Final    BLOOD RIGHT HAND Performed at [facility], [place] [address], [place], [place] [address]    Special Requests   Final    BOTTLES DRAWN AEROBIC ONLY Blood Culture adequate volume Performed at [name] [address], [place], [place] [address]    Culture   Final    NO GROWTH 2 DAYS Performed at [facility] [address], [place], [place] [address]    Report Status PENDING  Incomplete  SARS CORONAVIRUS 2 (TAT 6-24 HRS) Nasopharyngeal Nasopharyngeal Swab     Status: None   Collection Time: 11/05/19  9:52 PM   Specimen: Nasopharyngeal Swab  Result Value Ref Range Status   SARS Coronavirus 2 NEGATIVE NEGATIVE Final    Comment: (NOTE) SARS-CoV-2 target nucleic acids are NOT DETECTED. The SARS-CoV-2 RNA is generally detectable in upper and lower respiratory specimens during the acute phase of infection. Negative results do not preclude SARS-CoV-2 infection, do not rule out co-infections with other pathogens, and should not be used as the sole basis for treatment or other patient management decisions. Negative results must be combined with clinical observations, patient history, and epidemiological information. The expected result is Negative. Fact Sheet for Patients: SugarRoll.be Fact Sheet for Healthcare Providers: https://www.woods-mathews.com/ This test is not yet approved or cleared by the [place] FDA and  has been authorized for detection and/or diagnosis of SARS-CoV-2 by FDA under an Emergency Use Authorization (EUA). This EUA will remain  in effect (meaning this test can be used) for the duration of the COVID-19 declaration under Section 56 4(b)(1) of the Act, 21 U.S.C. section 360bbb-3(b)(1), unless the authorization is terminated or revoked sooner. Performed at [facility], [place] [address], [place], [place] [address]   Culture, blood (routine x 2)  Status: None (Preliminary result)   Collection Time: 11/05/19 11:57 PM   Specimen: BLOOD  Result Value Ref Range Status   Specimen Description   Final    BLOOD LEFT ANTECUBITAL Performed at [facility] [address] [place], [place] [address]    Special Requests   Final    BOTTLES DRAWN AEROBIC ONLY Blood Culture adequate volume Performed at [facility] [address], [place], [place] [address]    Culture   Final    NO GROWTH 1 DAY Performed at [facility], [place] [address] [place], [place] [address]    Report Status PENDING  Incomplete      Radiology Studies: Dg Ankle Complete Left  Result Date: 11/05/2019 CLINICAL DATA:  Pain EXAM: LEFT ANKLE COMPLETE - 3+ VIEW COMPARISON:  None. FINDINGS: There is nonspecific soft tissue swelling about the ankle without evidence for an acute displaced fracture or dislocation. The mortise joint is preserved. A moderate-sized plantar calcaneal spur is noted. Calcifications are noted within the plantar fascia. IMPRESSION: 1. Soft tissue swelling without evidence for acute bony abnormality. 2. Plantar calcaneal spur. 3. Calcifications within the plantar fascia. Electronically Signed   By: Constance Holster M.D.   On: 11/05/2019 17:43   Dg Chest Port 1 View  Result Date: 11/05/2019 CLINICAL DATA:  Leg swelling EXAM: PORTABLE CHEST 1 VIEW COMPARISON:  None. FINDINGS: There arm proving bibasilar and peripheral airspace  opacities. There is no pneumothorax. No large pleural effusion. The heart size remains enlarged. There is some vascular congestion without overt pulmonary edema. There is no acute osseous abnormality. IMPRESSION: 1. Improving bibasilar airspace opacities. Continued follow-up to resolution is recommended. 2. Cardiomegaly with vascular congestion. No overt pulmonary edema or large pleural effusion. Electronically Signed   By: Constance Holster M.D.   On: 11/05/2019 20:19   Dg Foot Complete Left  Result Date: 11/05/2019 CLINICAL DATA:  Foot pain. EXAM: LEFT FOOT - COMPLETE 3+ VIEW COMPARISON:  None. FINDINGS: Degenerative changes and erosions are noted at the first metatarsophalangeal joint. There is surrounding soft tissue swelling. There is no acute displaced fracture. No dislocation. Degenerative changes are noted of the midfoot. There is a moderate-sized plantar calcaneal spur. Calcifications are noted within the plantar fascia. There is no acute displaced fracture or dislocation. IMPRESSION: 1. Findings compatible with gout at the first metatarsophalangeal joint. 2. No acute displaced fracture or dislocation. 3. Degenerative changes of the midfoot. 4. Moderate-sized plantar calcaneal spur. 5. Calcifications within the plantar fascia may be secondary to underlying plantar fasciitis in the appropriate clinical setting. Clinical correlation is recommended. Electronically Signed   By: Constance Holster M.D.   On: 11/05/2019 17:42   Dg Foot Complete Right  Result Date: 11/05/2019 CLINICAL DATA:  Bilateral foot pain. EXAM: RIGHT FOOT COMPLETE - 3+ VIEW COMPARISON:  09/06/2015 FINDINGS: There are advanced degenerative changes of metatarsophalangeal joint of the first digit. There is surrounding soft tissue swelling. There may be some mild associated erosions. These findings have progressed since 2016. There is a moderate-sized plantar calcaneal spur. Calcifications are noted in the region of the plantar  fascia at the level of the hindfoot. Midfoot degenerative changes are noted. There is no acute displaced fracture or dislocation. IMPRESSION: 1. Advanced degenerative changes of the metatarsophalangeal joint of the first digit with surrounding soft tissue swelling. There may be some mild associated erosions. These findings have progressed since 2016 inter suspicious for gout. 2. Calcifications within the plantar fascia can be  seen in patients with plantar fasciitis. 3. No acute displaced fracture or dislocation. Electronically Signed   By: Constance Holster M.D.   On: 11/05/2019 17:41   Vas [place] Lower Extremity Venous (dvt) (mc And Wl 7a-7p)  Result Date: 11/06/2019  Lower Venous Study Indications: Swelling.  Risk Factors: None identified. Limitations: Poor ultrasound/tissue interface and body habitus. Comparison Study: No prior stuides. Performing Technologist: Oliver Hum RVT  Examination Guidelines: A complete evaluation includes B-mode imaging, spectral Doppler, color Doppler, and power Doppler as needed of all accessible portions of each vessel. Bilateral testing is considered an integral part of a complete examination. Limited examinations for reoccurring indications may be performed as noted.  +-----+---------------+---------+-----------+----------+--------------+  RIGHT Compressibility Phasicity Spontaneity Properties Thrombus Aging  +-----+---------------+---------+-----------+----------+--------------+  CFV   Full            Yes       Yes                                    +-----+---------------+---------+-----------+----------+--------------+   +---------+---------------+---------+-----------+----------+--------------+  LEFT      Compressibility Phasicity Spontaneity Properties Thrombus Aging  +---------+---------------+---------+-----------+----------+--------------+  CFV       Full            Yes       Yes                                     +---------+---------------+---------+-----------+----------+--------------+  SFJ       Full                                                             +---------+---------------+---------+-----------+----------+--------------+  FV Prox   Full                                                             +---------+---------------+---------+-----------+----------+--------------+  FV Mid    Full                                                             +---------+---------------+---------+-----------+----------+--------------+  FV Distal Full                                                             +---------+---------------+---------+-----------+----------+--------------+  PFV       Full                                                             +---------+---------------+---------+-----------+----------+--------------+  POP       Full            Yes       Yes                                    +---------+---------------+---------+-----------+----------+--------------+  PTV       Full                                                             +---------+---------------+---------+-----------+----------+--------------+  PERO      Full                                                             +---------+---------------+---------+-----------+----------+--------------+     Summary: Right: No evidence of common femoral vein obstruction. Left: There is no evidence of deep vein thrombosis in the lower extremity. No cystic structure found in the popliteal fossa.  *See table(s) above for measurements and observations. Electronically signed by Deitra Mayo MD on 11/06/2019 at 9:24:59 AM.    Final       LOS: 2 days   Time spent: More than 50% of that time was spent in counseling and/or coordination of care.  Antonieta Pert, MD [facility]  11/07/2019, 8:03 AM

## 2019-11-07 NOTE — Progress Notes (Signed)
Inpatient Rehab Admissions:  Inpatient Rehab Consult received.  I spoke with pt over the phone for rehabilitation assessment and to discuss goals and expectations of an inpatient rehab admission.  She states she would rather not have to go to another hospital right away for rehab and asked if she could speak to her family about it.  I will follow up with her tomorrow.   Signed: Shann Medal, PT, DPT Admissions Coordinator [phone] 11/07/19  10:45 AM

## 2019-11-08 ENCOUNTER — Other Ambulatory Visit: Payer: Self-pay

## 2019-11-08 DIAGNOSIS — D649 Anemia, unspecified: Secondary | ICD-10-CM

## 2019-11-08 LAB — BASIC METABOLIC PANEL
Anion gap: 15 (ref 5–15)
BUN: 93 mg/dL — ABNORMAL HIGH (ref 8–23)
CO2: 14 mmol/L — ABNORMAL LOW (ref 22–32)
Calcium: 9.4 mg/dL (ref 8.9–10.3)
Chloride: 108 mmol/L (ref 98–111)
Creatinine, Ser: 4.51 mg/dL — ABNORMAL HIGH (ref 0.44–1.00)
GFR calc Af Amer: 11 mL/min — ABNORMAL LOW (ref >60)
GFR calc non Af Amer: 9 mL/min — ABNORMAL LOW (ref >60)
Glucose, Bld: 111 mg/dL — ABNORMAL HIGH (ref 70–99)
Potassium: 4.7 mmol/L (ref 3.5–5.1)
Sodium: 137 mmol/L (ref 135–145)

## 2019-11-08 LAB — CBC
HCT: 22 % — ABNORMAL LOW (ref 36.0–46.0)
Hemoglobin: 6.9 g/dL — CL (ref 12.0–15.0)
MCH: 32.4 pg (ref 26.0–34.0)
MCHC: 31.4 g/dL (ref 30.0–36.0)
MCV: 103.3 fL — ABNORMAL HIGH (ref 80.0–100.0)
Platelets: 233 10*3/uL (ref 150–400)
RBC: 2.13 MIL/uL — ABNORMAL LOW (ref 3.87–5.11)
RDW: 17.1 % — ABNORMAL HIGH (ref 11.5–15.5)
WBC: 7.7 10*3/uL (ref 4.0–10.5)
nRBC: 0 % (ref 0.0–0.2)

## 2019-11-08 LAB — PREPARE RBC (CROSSMATCH)

## 2019-11-08 LAB — HEMOGLOBIN AND HEMATOCRIT, BLOOD
HCT: 27.8 % — ABNORMAL LOW (ref 36.0–46.0)
Hemoglobin: 8.8 g/dL — ABNORMAL LOW (ref 12.0–15.0)

## 2019-11-08 LAB — ABO/RH: ABO/RH(D): O POS

## 2019-11-08 LAB — OCCULT BLOOD X 1 CARD TO LAB, STOOL: Fecal Occult Bld: NEGATIVE

## 2019-11-08 MED ORDER — PREDNISONE 20 MG PO TABS
20.0000 mg | ORAL_TABLET | Freq: Every day | ORAL | Status: DC
  Administered 2019-11-09 – 2019-11-10 (×2): 20 mg via ORAL
  Filled 2019-11-08 (×2): qty 1

## 2019-11-08 MED ORDER — SODIUM CHLORIDE 0.9% IV SOLUTION: Freq: Once | INTRAVENOUS | Status: DC

## 2019-11-08 MED ORDER — APIXABAN 2.5 MG PO TABS
2.5000 mg | ORAL_TABLET | Freq: Two times a day (BID) | ORAL | Status: DC
  Administered 2019-11-08 – 2019-11-12 (×9): 2.5 mg via ORAL
  Filled 2019-11-08 (×10): qty 1

## 2019-11-08 MED ORDER — VITAMIN B-12 1000 MCG PO TABS
500.0000 ug | ORAL_TABLET | Freq: Every day | ORAL | Status: DC
  Administered 2019-11-08 – 2019-11-12 (×5): 500 ug via ORAL
  Filled 2019-11-08 (×5): qty 1

## 2019-11-08 NOTE — TOC Progression Note (Signed)
Transition of Care [facility]) - Progression Note    Patient Details  Name: Diamond [name] MRN: [id number] Date of Birth: 1951-05-15  [facility] CM/SW Contact  Purcell Mouton, RN Phone Number: 11/08/2019, 12:55 PM  Clinical Narrative:    Spoke with pt concerning discharge plans. Pt states she is going to CIR.    Expected Discharge Plan: IP Rehab Facility Barriers to Discharge: No Barriers Identified  Expected Discharge Plan and Services Expected Discharge Plan: [place]   Discharge Planning Services: CM Consult   Living arrangements for the past 2 months: Single Family Home                                       Social Determinants of Health (SDOH) Interventions    Readmission Risk Interventions No flowsheet data found.

## 2019-11-08 NOTE — Progress Notes (Signed)
CRITICAL VALUE ALERT  Critical Value:  hgb 6.9  Date & Time Notied:  11/08/19, 0383  Provider Notified: M. Elmahi  Orders Received/Actions taken: waiting orders

## 2019-11-08 NOTE — Progress Notes (Signed)
This RN discussed with patient if she is acceptable to receiving blood transfusion due to morning hgb of 6.9 to which patient states, " do we have to talk about that, because I don't want to talk about it." On-call provider was made aware of patient's disinterest in blood administration. Patient does not appear to be bleeding from any source and is asymptomatic. Will continue to monitor and relay information to day shift RN.

## 2019-11-08 NOTE — Care Management Important Message (Signed)
Important Message  Patient Details IM Letter given to Cookie McGibboney RN to present to the Patient Name: Diamond [name] MRN: [id number] Date of Birth: 1951-01-09   Medicare Important Message Given:  Yes     Kerin Salen 11/08/2019, 12:29 PM

## 2019-11-08 NOTE — Progress Notes (Signed)
Inpatient Rehab Admissions Coordinator:    I was able to speak to pt again and she is agreeable to CIR. Also spoke to her daughter [name] (left messages for [employer]) who confirms pt will have 24/7 support at discharge.  I will open up a case with pt's insurance for prior authorization for possible admission Friday or Monday, pending medical readiness and insurance approval.    Shann Medal, PT, DPT Admissions Coordinator [phone] 11/08/19  11:21 AM

## 2019-11-08 NOTE — Progress Notes (Signed)
PROGRESS NOTE    Diamond [name]  VQQ:[id number] DOB: 1951-01-07 DOA: 11/05/2019 PCP: [name], MD   Brief Narrative: 68 year old female with history of recent Covid infection 10/16/2019, essential hypertension, asthma, CKD stage III, gout presented with generalized weakness, left leg and foot swelling, not being able to ambulate due to swelling in the left leg. Patient already received a last admission were found to have, followed by nephrology, creatinine trended up to 3.8 baseline 2.2 felt to be COVID-19 infection had taken remdesivir steroids, hospital course complicated by A. fib that converted to NSR after verapamil and atenolol which is her home medication patient was on heparin then transition to Eliquis and renal function improved at the time of discharge. Patient notes history of gout treated with colchicine.  Subjective: No acute events overnight but her hemoglobin dropped to 6.9, patient did not want to talk about blood transfusion during early morning. Discussed with her today regarding blood transfusion her hemoglobin status Chest pain nausea vomiting fever chills.  Labs- bicarb still low at 14, creatinine at 4.8->4.6-> 4.5. Hb at  6.9 from 7.0, 7.7-with MCV of 102 On room air 98%   Assessment & Plan:   AKI on CKD stage IIIb: Suspecting more subacute/chronic than acute as per nephrology. On recent imaging negative for acute structural problems.  Patient had recent diarrhea could have caused hypovolemia and prerenal presentation. No indication for acute dialysis at this time.  followed by nephrology= on bicarb, and signed off advised outpatient follow-up. Recent Labs  Lab 11/05/19 2118 11/05/19 2357 11/06/19 0742 11/07/19 0347 11/08/19 0408  BUN 62* 66* 65* 76* 93*  CREATININE 4.80* 5.03* 4.87* 4.60* 4.51*   Anemia, likely from chronic disease, with low ferritin: In the setting of recent Covid infection/CKD,status post Feraheme, B12 on lower side 222-continue  B12 supplementation orally.  Hemoccult was checked and is negative. Of note she is on Eliquis- given her CKD discussed w/ pharmacy to adjust the dose. Patient wants to continue the Eliquis for her A. fib for now. Again discussed about blood transfusion-and after speaking  with family patient finally agreed for blood transfusion understanding associated benefits/risks ( like minimal risk of infection, allergic reactions, shock). Reports she has never had EGD or colonoscopy previously and may be worth having f/u with GI. No hx of melena or hematochezia.  Transfusing 1 unit PRBC and recheck H&H. Recent Labs  Lab 11/05/19 1455 11/05/19 2357 11/07/19 0347 11/08/19 0408  HGB 8.6* 7.7* 7.0* 6.9*  HCT 28.7* 24.2* 43.3* 29.5*   Metabolic acidosis, current bicarb at 14. Started on po bicarb by Nephrology.  Acute on chronic gout being treated with prednisone .  Pain is controlled on foot.  AF, persistent:rate controlled. on Eliquis, metoprolol and verapamil.  Patient had a fever on previous admission at [facility] that converted to normal TSH stable.  Echo with EF 60 to 65%. CHADS2VASC score is 3.  Monitor CBC closely  Hypertension: Blood pressure is stable.  On verapamil and metoprolol for A. fib.  Body mass index is 29.32 kg/m.   DVT prophylaxis: Eliquis  Code Status: full Family Communication: plan of care discussed with patient at bedside.  Attempted to reach patient's son Marchia Bond was unsuccessful and it went to voicemail, patient aware. Disposition Plan: Remains inpatient pending clinical improvement and APTT. Plan for CIR once stable-reassess in am.  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   Consultants: nephro Procedures: 2D echocardiogram 12/1 1. Left ventricular ejection fraction, by visual estimation, is 60 to 65%.  The left ventricle has normal function. There is no left ventricular hypertrophy.  2. Left ventricular diastolic parameters are indeterminate.  3. Global right ventricle has  normal systolic function.The right ventricular size is normal. No increase in right ventricular wall thickness.  4. Left atrial size was mildly dilated.  5. Right atrial size was normal.  6. Mild mitral annular calcification.  7. Moderate thickening of the mitral valve leaflet(s).  8. The mitral valve is normal in structure. Mild mitral valve regurgitation. No evidence of mitral stenosis.  9. The tricuspid valve is normal in structure. Tricuspid valve regurgitation moderate-severe. 10. The aortic valve is tricuspid. Aortic valve regurgitation is not visualized. Sclerosis. 11. There is Moderate calcification of the aortic valve. 12. There is Moderate thickening of the aortic valve. 13. The pulmonic valve was grossly normal. Pulmonic valve regurgitation is mild. 14. Aortic dilatation noted. 15. There is mild dilatation of the aortic root measuring 39 mm. 16. Normal pulmonary artery systolic pressure. 17. The inferior vena cava is normal in size with greater than 50% respiratory variability, suggesting right atrial pressure of 3 mmHg.  Microbiology:  Antimicrobials: Anti-infectives (From admission, onward)   None       Objective: Vitals:   11/07/19 1418 11/07/19 1643 11/07/19 2041 11/08/19 0511  BP: 127/86 135/89 (!) 133/104 (!) 146/101  Pulse: 91 94 80 96  Resp: 16  16 16   Temp: 97.7 F (36.5 C)  98.4 F (36.9 C) 98.3 F (36.8 C)  TempSrc: Oral  Oral Oral  SpO2: 100%  100% 100%  Weight:      Height:        Intake/Output Summary (Last 24 hours) at 11/08/2019 0929 Last data filed at 11/08/2019 0600 Gross per 24 hour  Intake 598 ml  Output 775 ml  Net -177 ml   Filed Weights   11/06/19 1353  Weight: 84.9 kg   Weight change:   Body mass index is 29.32 kg/m.  Intake/Output from previous day: 12/02 0701 - 12/03 0700 In: 598 [P.O.:598] Out: 775 [Urine:775] Intake/Output this shift: No intake/output data recorded.  Examination:  General exam: Alert awake oriented  x3 not in acute distress, on room air.   HEENT:Oral mucosa moist, Ear/Nose WNL grossly, dentition normal. Respiratory system: Bilaterally clear breath sounds diminished air entry at the base no wheezing or crackles  Cardiovascular system: S1 & S2 +, No JVD,. Gastrointestinal system: Abdomen soft,Obese, NT,ND, BS+ Nervous System:Alert, awake, moving extremities and grossly nonfocal Extremities: Left lower  extremity edema  distal peripheral pulses palpable.  Skin: No rashes,no icterus. MSK: Normal muscle bulk,tone, power  Medications:  Scheduled Meds: . sodium chloride   Intravenous Once  . apixaban  5 mg Oral BID  . metoprolol tartrate  25 mg Oral BID  . mometasone-formoterol  2 puff Inhalation BID  . predniSONE  40 mg Oral Q breakfast  . sodium bicarbonate  1,300 mg Oral BID  . verapamil  120 mg Oral TID  . vitamin B-12  500 mcg Oral Daily   Continuous Infusions:  Data Reviewed: I have personally reviewed following labs and imaging studies  CBC: Recent Labs  Lab 11/05/19 1455 11/05/19 2357 11/07/19 0347 11/08/19 0408  WBC 7.6 7.3 6.7 7.7  NEUTROABS 6.4  --   --   --   HGB 8.6* 7.7* 7.0* 6.9*  HCT 28.7* 24.2* 22.5* 22.0*  MCV 107.9* 102.1* 102.7* 103.3*  PLT 101* 111* 159 814   Basic Metabolic Panel: Recent Labs  Lab 11/05/19 1644  11/05/19 2118 11/05/19 2357 11/06/19 0742 11/07/19 0347 11/08/19 0408  NA  --  140 139 143 138 137  K  --  3.8 4.2 4.5 4.7 4.7  CL  --  114* 110 113* 111 108  CO2  --  16* 17* 14* 14* 14*  GLUCOSE  --  94 137* 109* 114* 111*  BUN  --  62* 66* 65* 76* 93*  CREATININE  --  4.80* 5.03* 4.87* 4.60* 4.51*  CALCIUM  --  8.2* 9.0 8.8* 9.4 9.4  MG 2.0  --  2.0  --   --   --   PHOS  --   --  6.7*  --   --   --    GFR: Estimated Creatinine Clearance: 13.2 mL/min (A) (by C-G formula based on SCr of 4.51 mg/dL (H)). Liver Function Tests: Recent Labs  Lab 11/05/19 1455 11/05/19 2357  AST 37 19  ALT 26 25  ALKPHOS 60 59  BILITOT 0.9  0.4  PROT 7.5 7.0  ALBUMIN 2.4* 2.2*   No results for input(s): LIPASE, AMYLASE in the last 168 hours. No results for input(s): AMMONIA in the last 168 hours. Coagulation Profile: No results for input(s): INR, PROTIME in the last 168 hours. Cardiac Enzymes: Recent Labs  Lab 11/05/19 1644  CKTOTAL 158   BNP (last 3 results) No results for input(s): PROBNP in the last 8760 hours. HbA1C: No results for input(s): HGBA1C in the last 72 hours. CBG: No results for input(s): GLUCAP in the last 168 hours. Lipid Profile: No results for input(s): CHOL, HDL, LDLCALC, TRIG, CHOLHDL, LDLDIRECT in the last 72 hours. Thyroid Function Tests: Recent Labs    11/05/19 2357  TSH 2.260   Anemia Panel: Recent Labs    11/05/19 2357 11/06/19 0402  VITAMINB12  --  222  FOLATE  --  14.1  FERRITIN 756* 290  TIBC  --  147*  IRON  --  15*  RETICCTPCT  --  1.4   Sepsis Labs: Recent Labs  Lab 11/05/19 1455 11/05/19 1830 11/05/19 2118 11/05/19 2357  LATICACIDVEN 2.3* 2.3* 2.4* 1.3    Recent Results (from the past 240 hour(s))  Culture, blood (routine x 2)     Status: None (Preliminary result)   Collection Time: 11/05/19  4:13 PM   Specimen: BLOOD RIGHT HAND  Result Value Ref Range Status   Specimen Description   Final    BLOOD RIGHT HAND Performed at [facility], [place] [address] [place], [place] [address]    Special Requests   Final    BOTTLES DRAWN AEROBIC ONLY Blood Culture adequate volume Performed at [place] [address], [place], [place] [address]    Culture   Final    NO GROWTH 3 DAYS Performed at [facility], [place] [address], [place], [place] [address]    Report Status PENDING  Incomplete  SARS CORONAVIRUS 2 (TAT 6-24 HRS) Nasopharyngeal Nasopharyngeal Swab     Status: None   Collection Time: 11/05/19  9:52 PM   Specimen: Nasopharyngeal Swab  Result Value Ref Range Status   SARS Coronavirus 2 NEGATIVE NEGATIVE Final     Comment: (NOTE) SARS-CoV-2 target nucleic acids are NOT DETECTED. The SARS-CoV-2 RNA is generally detectable in upper and lower respiratory specimens during the acute phase of infection. Negative results do not preclude SARS-CoV-2 infection, do not rule out co-infections with other pathogens, and should not be used as the sole basis for treatment  or other patient management decisions. Negative results must be combined with clinical observations, patient history, and epidemiological information. The expected result is Negative. Fact Sheet for Patients: SugarRoll.be Fact Sheet for Healthcare Providers: https://www.woods-mathews.com/ This test is not yet approved or cleared by the [place] FDA and  has been authorized for detection and/or diagnosis of SARS-CoV-2 by FDA under an Emergency Use Authorization (EUA). This EUA will remain  in effect (meaning this test can be used) for the duration of the COVID-19 declaration under Section 56 4(b)(1) of the Act, 21 U.S.C. section 360bbb-3(b)(1), unless the authorization is terminated or revoked sooner. Performed at [facility], [place] [address] [place], [place] [address]   Culture, blood (routine x 2)     Status: None (Preliminary result)   Collection Time: 11/05/19 11:57 PM   Specimen: BLOOD  Result Value Ref Range Status   Specimen Description   Final    BLOOD LEFT ANTECUBITAL Performed at [place] [address], [place], [place] [address]    Special Requests   Final    BOTTLES DRAWN AEROBIC ONLY Blood Culture adequate volume Performed at [place] [address], [place], [place] [address]    Culture   Final    NO GROWTH 2 DAYS Performed at [place] [address], [place], [place] [address]    Report Status PENDING  Incomplete      Radiology Studies: No results found.    LOS: 3 days   Time spent: More than 50% of  that time was spent in counseling and/or coordination of care.  Antonieta Pert, MD [facility]  11/08/2019, 9:29 AM

## 2019-11-09 DIAGNOSIS — M1000 Idiopathic gout, unspecified site: Secondary | ICD-10-CM

## 2019-11-09 LAB — BASIC METABOLIC PANEL
Anion gap: 15 (ref 5–15)
BUN: 91 mg/dL — ABNORMAL HIGH (ref 8–23)
CO2: 15 mmol/L — ABNORMAL LOW (ref 22–32)
Calcium: 9 mg/dL (ref 8.9–10.3)
Chloride: 106 mmol/L (ref 98–111)
Creatinine, Ser: 4.22 mg/dL — ABNORMAL HIGH (ref 0.44–1.00)
GFR calc Af Amer: 12 mL/min — ABNORMAL LOW (ref >60)
GFR calc non Af Amer: 10 mL/min — ABNORMAL LOW (ref >60)
Glucose, Bld: 115 mg/dL — ABNORMAL HIGH (ref 70–99)
Potassium: 5 mmol/L (ref 3.5–5.1)
Sodium: 136 mmol/L (ref 135–145)

## 2019-11-09 LAB — CBC
HCT: 25.3 % — ABNORMAL LOW (ref 36.0–46.0)
Hemoglobin: 8.2 g/dL — ABNORMAL LOW (ref 12.0–15.0)
MCH: 32.5 pg (ref 26.0–34.0)
MCHC: 32.4 g/dL (ref 30.0–36.0)
MCV: 100.4 fL — ABNORMAL HIGH (ref 80.0–100.0)
Platelets: 271 10*3/uL (ref 150–400)
RBC: 2.52 MIL/uL — ABNORMAL LOW (ref 3.87–5.11)
RDW: 17.5 % — ABNORMAL HIGH (ref 11.5–15.5)
WBC: 6.3 10*3/uL (ref 4.0–10.5)
nRBC: 0.3 % — ABNORMAL HIGH (ref 0.0–0.2)

## 2019-11-09 LAB — BPAM RBC
Blood Product Expiration Date: 202012302359
ISSUE DATE / TIME: 202012031320
Unit Type and Rh: 5100

## 2019-11-09 LAB — TYPE AND SCREEN
ABO/RH(D): O POS
Antibody Screen: NEGATIVE
Unit division: 0

## 2019-11-09 NOTE — Progress Notes (Signed)
PROGRESS NOTE    Diamond [name]  XIP:[id number] DOB: 1951/11/16 DOA: 11/05/2019 PCP: [name], MD   Brief Narrative: 68 year old female with history of recent Covid infection 10/16/2019, essential hypertension, asthma, CKD stage III, gout presented with generalized weakness, left leg and foot swelling, not being able to ambulate due to swelling in the left leg. Patient already received a last admission were found to have, followed by nephrology, creatinine trended up to 3.8 baseline 2.2 felt to be COVID-19 infection had taken remdesivir steroids, hospital course complicated by A. fib that converted to NSR after verapamil and atenolol which is her home medication patient was on heparin then transition to Eliquis and renal function improved at the time of discharge. Patient notes history of gout treated with colchicine.  Subjective: Resting comfortably.no new complaints. Hemoglobin this morning and 8.2 g-status post 1 unit PRBC 12/3 Denies nausea vomiting chest pain.  Assessment & Plan:   AKI on CKD stage IIIb: Suspecting more subacute/chronic than acute as per nephrology. On recent imaging negative for acute structural problems.  Patient had recent diarrhea could have caused hypovolemia and prerenal presentation. No indication for acute dialysis at this time.  Seen by nephrology, placed on bicarb, and signed off- advised outpatient follow-up.  Creatinine 4.2 BUN 91 today. Recent Labs  Lab 11/05/19 2357 11/06/19 0742 11/07/19 0347 11/08/19 0408 11/09/19 0404  BUN 66* 65* 76* 93* 91*  CREATININE 5.03* 4.87* 4.60* 4.51* 4.22*   Anemia, likely from chronic disease, with low ferritin: Suspect multifactorial due to recent Covid infection/CKD,status post Feraheme, B12 on lower side 222-continue B12 supplementation orally.  Hemoccult was checked and is negative. Of note she is on Eliquis- given her CKD discussed w/ pharmacy to adjust the dose. Patient wants to continue the Eliquis for  her A. fib for now.  Status post 1 unit PRBC hemoglobin at 8.2 g.  Monitor.  If continues to have recurrent worsening anemia will need GI evaluation for possible scope .  CBC in a.m.  Metabolic acidosis, current bicarb at 14->15.cont on po bicarb .  Acute on chronic gout being treated with prednisone-stop after 5 doses. Pain is controlled on foot.  AF, persistent:rate controlled. on Eliquis, metoprolol and verapamil.  Patient had RVR on previous admission at [facility] that converted to normal TSH stable.  Echo with EF 60 to 65%. CHADS2VASC score is 3.   Hypertension: Blood pressure is stable.  On verapamil and metoprolol for A. fib.  Body mass index is 29.32 kg/m.   DVT prophylaxis: Eliquis  Code Status: full Family Communication: plan of care discussed with patient at bedside. Disposition Plan: Remains inpatient pending further CIR placement.  Awaiting for insurance approval.    Consultants: nephro Procedures: 2D echocardiogram 12/1 1. Left ventricular ejection fraction, by visual estimation, is 60 to 65%. The left ventricle has normal function. There is no left ventricular hypertrophy.  2. Left ventricular diastolic parameters are indeterminate.  3. Global right ventricle has normal systolic function.The right ventricular size is normal. No increase in right ventricular wall thickness.  4. Left atrial size was mildly dilated.  5. Right atrial size was normal.  6. Mild mitral annular calcification.  7. Moderate thickening of the mitral valve leaflet(s).  8. The mitral valve is normal in structure. Mild mitral valve regurgitation. No evidence of mitral stenosis.  9. The tricuspid valve is normal in structure. Tricuspid valve regurgitation moderate-severe. 10. The aortic valve is tricuspid. Aortic valve regurgitation is not visualized. Sclerosis. 11. There is Moderate  calcification of the aortic valve. 12. There is Moderate thickening of the aortic valve. 13. The pulmonic valve was grossly  normal. Pulmonic valve regurgitation is mild. 14. Aortic dilatation noted. 15. There is mild dilatation of the aortic root measuring 39 mm. 16. Normal pulmonary artery systolic pressure. 17. The inferior vena cava is normal in size with greater than 50% respiratory variability, suggesting right atrial pressure of 3 mmHg.  Microbiology:  Antimicrobials: Anti-infectives (From admission, onward)   None       Objective: Vitals:   11/09/19 0515 11/09/19 0751 11/09/19 0830 11/09/19 1243  BP: (!) 147/102  (!) 147/110 (!) 150/100  Pulse: 83   70  Resp: 16   18  Temp: 97.9 F (36.6 C)   (!) 97.5 F (36.4 C)  TempSrc: Oral   Oral  SpO2: 100% 98%  98%  Weight:      Height:        Intake/Output Summary (Last 24 hours) at 11/09/2019 1521 Last data filed at 11/09/2019 1230 Gross per 24 hour  Intake 1534 ml  Output 1400 ml  Net 134 ml   Filed Weights   11/06/19 1353  Weight: 84.9 kg   Weight change:   Body mass index is 29.32 kg/m.  Intake/Output from previous day: 12/03 0701 - 12/04 0700 In: 1054 [P.O.:600; Blood:454] Out: 1200 [Urine:1200] Intake/Output this shift: Total I/O In: 480 [P.O.:480] Out: 500 [Urine:500]  Examination:  General exam: Alert awake oriented x3, not in acute distress, on room air.   HEENT:Oral mucosa moist, Ear/Nose WNL grossly, dentition normal. Respiratory system: B/l clear  With no wheezing or crackles  Cardiovascular system: S1 & S2 +, No JVD,. Gastrointestinal system: Abdomen soft,obese, NT,ND, BS+ Nervous System:Alert, awake, moving extremities and grossly nonfocal Extremities: Left lower  extremity edema mild- distal peripheral pulses palpable.  Skin: No rashes,no icterus. MSK: Normal muscle bulk,tone, power  Medications:  Scheduled Meds: . sodium chloride   Intravenous Once  . apixaban  2.5 mg Oral BID  . metoprolol tartrate  25 mg Oral BID  . mometasone-formoterol  2 puff Inhalation BID  . predniSONE  20 mg Oral Q breakfast  .  sodium bicarbonate  1,300 mg Oral BID  . verapamil  120 mg Oral TID  . vitamin B-12  500 mcg Oral Daily   Continuous Infusions:  Data Reviewed: I have personally reviewed following labs and imaging studies  CBC: Recent Labs  Lab 11/05/19 1455 11/05/19 2357 11/07/19 0347 11/08/19 0408 11/08/19 2115 11/09/19 0404  WBC 7.6 7.3 6.7 7.7  --  6.3  NEUTROABS 6.4  --   --   --   --   --   HGB 8.6* 7.7* 7.0* 6.9* 8.8* 8.2*  HCT 28.7* 24.2* 22.5* 22.0* 27.8* 25.3*  MCV 107.9* 102.1* 102.7* 103.3*  --  100.4*  PLT 101* 111* 159 233  --  725   Basic Metabolic Panel: Recent Labs  Lab 11/05/19 1644  11/05/19 2357 11/06/19 0742 11/07/19 0347 11/08/19 0408 11/09/19 0404  NA  --    < > 139 143 138 137 136  K  --    < > 4.2 4.5 4.7 4.7 5.0  CL  --    < > 110 113* 111 108 106  CO2  --    < > 17* 14* 14* 14* 15*  GLUCOSE  --    < > 137* 109* 114* 111* 115*  BUN  --    < > 66* 65* 76* 93* 91*  CREATININE  --    < > 5.03* 4.87* 4.60* 4.51* 4.22*  CALCIUM  --    < > 9.0 8.8* 9.4 9.4 9.0  MG 2.0  --  2.0  --   --   --   --   PHOS  --   --  6.7*  --   --   --   --    < > = values in this interval not displayed.   GFR: Estimated Creatinine Clearance: 14.1 mL/min (A) (by C-G formula based on SCr of 4.22 mg/dL (H)). Liver Function Tests: Recent Labs  Lab 11/05/19 1455 11/05/19 2357  AST 37 19  ALT 26 25  ALKPHOS 60 59  BILITOT 0.9 0.4  PROT 7.5 7.0  ALBUMIN 2.4* 2.2*   No results for input(s): LIPASE, AMYLASE in the last 168 hours. No results for input(s): AMMONIA in the last 168 hours. Coagulation Profile: No results for input(s): INR, PROTIME in the last 168 hours. Cardiac Enzymes: Recent Labs  Lab 11/05/19 1644  CKTOTAL 158   BNP (last 3 results) No results for input(s): PROBNP in the last 8760 hours. HbA1C: No results for input(s): HGBA1C in the last 72 hours. CBG: No results for input(s): GLUCAP in the last 168 hours. Lipid Profile: No results for input(s): CHOL,  HDL, LDLCALC, TRIG, CHOLHDL, LDLDIRECT in the last 72 hours. Thyroid Function Tests: No results for input(s): TSH, T4TOTAL, FREET4, T3FREE, THYROIDAB in the last 72 hours. Anemia Panel: No results for input(s): VITAMINB12, FOLATE, FERRITIN, TIBC, IRON, RETICCTPCT in the last 72 hours. Sepsis Labs: Recent Labs  Lab 11/05/19 1455 11/05/19 1830 11/05/19 2118 11/05/19 2357  LATICACIDVEN 2.3* 2.3* 2.4* 1.3    Recent Results (from the past 240 hour(s))  Culture, blood (routine x 2)     Status: None (Preliminary result)   Collection Time: 11/05/19  4:13 PM   Specimen: BLOOD RIGHT HAND  Result Value Ref Range Status   Specimen Description   Final    BLOOD RIGHT HAND Performed at [facility], [place] [address] [place], [place] [address]    Special Requests   Final    BOTTLES DRAWN AEROBIC ONLY Blood Culture adequate volume Performed at [place] [address] [place], [place] [address]    Culture   Final    NO GROWTH 4 DAYS Performed at [facility], [place] [address], [place], [place] [address]    Report Status PENDING  Incomplete  SARS CORONAVIRUS 2 (TAT 6-24 HRS) Nasopharyngeal Nasopharyngeal Swab     Status: None   Collection Time: 11/05/19  9:52 PM   Specimen: Nasopharyngeal Swab  Result Value Ref Range Status   SARS Coronavirus 2 NEGATIVE NEGATIVE Final    Comment: (NOTE) SARS-CoV-2 target nucleic acids are NOT DETECTED. The SARS-CoV-2 RNA is generally detectable in upper and lower respiratory specimens during the acute phase of infection. Negative results do not preclude SARS-CoV-2 infection, do not rule out co-infections with other pathogens, and should not be used as the sole basis for treatment or other patient management decisions. Negative results must be combined with clinical observations, patient history, and epidemiological information. The expected result is Negative. Fact Sheet for Patients:  SugarRoll.be Fact Sheet for Healthcare Providers: https://www.woods-mathews.com/ This test is not yet approved or cleared by the [place] FDA and  has been authorized for detection and/or diagnosis of SARS-CoV-2 by FDA under an Emergency Use Authorization (EUA). This EUA will remain  in effect (meaning  this test can be used) for the duration of the COVID-19 declaration under Section 56 4(b)(1) of the Act, 21 U.S.C. section 360bbb-3(b)(1), unless the authorization is terminated or revoked sooner. Performed at [facility], [place] [address], [place], [place] [address]   Culture, blood (routine x 2)     Status: None (Preliminary result)   Collection Time: 11/05/19 11:57 PM   Specimen: BLOOD  Result Value Ref Range Status   Specimen Description   Final    BLOOD LEFT ANTECUBITAL Performed at [place] [address], [place], [place] [address]    Special Requests   Final    BOTTLES DRAWN AEROBIC ONLY Blood Culture adequate volume Performed at [place] [address], [place], [place] [address]    Culture   Final    NO GROWTH 3 DAYS Performed at [facility], [place] [address] [place], [place] [address]    Report Status PENDING  Incomplete      Radiology Studies: No results found.    LOS: 4 days   Time spent: More than 50% of that time was spent in counseling and/or coordination of care.  Antonieta Pert, MD [facility]  11/09/2019, 3:21 PM

## 2019-11-09 NOTE — Progress Notes (Signed)
Physical Therapy Treatment Patient Details Name: Diamond [name] MRN: [id number] DOB: 1951/03/22 Today's Date: 11/09/2019    History of Present Illness Pt is a 68 y.o. female with PMHx including recent COVID infection diagnosed 10/16/2019 (found to have AKI during this admission), HTN, well-controlled asthma, CKD, gout who presented with progressive generalized weakness, swelling in L leg/foot and inability to ambulate. Admitted for further work-up.    PT Comments    Pt is gradually progressing with mobility, however continues to fatigue very quickly. She performed sit to stand x 3 from elevated surface with min assist. Pt was able to stand ~10 seconds before onset of LE fatigue. Instructed pt in BLE strengthening exercises. Vital signs stable on room air. Continue to recommend CIR. Pt puts forth good effort.    Follow Up Recommendations  CIR;SNF     Equipment Recommendations  None recommended by PT    Recommendations for Other Services       Precautions / Restrictions Precautions Precautions: Fall Restrictions Weight Bearing Restrictions: No    Mobility  Bed Mobility Overal bed mobility: Needs Assistance Bed Mobility: Supine to Sit     Supine to sit: HOB elevated;Supervision     General bed mobility comments: used rail, HOB up  Transfers Overall transfer level: Needs assistance Equipment used: Rolling walker (2 wheeled) Transfers: Sit to/from [facility] Sit to Stand: From elevated surface;Min assist Stand pivot transfers: Min assist       General transfer comment: VCs for hand placement. sit to stand x 3 from elevated bed. Pt able to stand ~10 seconds x 3 trials. Pt stands with flexed posture, tolerance limited by "Legs giving out". SaO2 97% on room air 1 minute after activity.  Ambulation/Gait                 Stairs             Wheelchair Mobility    Modified Rankin (Stroke Patients Only)       Balance Overall balance  assessment: Needs assistance Sitting-balance support: Feet supported;Bilateral upper extremity supported Sitting balance-Leahy Scale: Fair Sitting balance - Comments: leans on elbows at times; fatigues easily   Standing balance support: Bilateral upper extremity supported Standing balance-Leahy Scale: Poor                              Cognition Arousal/Alertness: Awake/alert Behavior During Therapy: WFL for tasks assessed/performed Overall Cognitive Status: No family/caregiver present to determine baseline cognitive functioning                                 General Comments: pt asked, "why am I so weak", noted she asked this same question during previous PT session. Explained that covid and AKI contribute to weakness. Otherwise, seems oriented and able to follow commands.      Exercises General Exercises - Lower Extremity Ankle Circles/Pumps: AROM;Both;15 reps;Supine Quad Sets: AROM;Both;5 reps;Supine Gluteal Sets: AROM;Both;5 reps;Supine    General Comments        Pertinent Vitals/Pain Faces Pain Scale: Hurts little more Pain Location: B feet in standing Pain Descriptors / Indicators: Aching Pain Intervention(s): Limited activity within patient's tolerance;Monitored during session;Premedicated before session;Ice applied    Home Living                      Prior Function  PT Goals (current goals can now be found in the care plan section) Acute Rehab PT Goals Patient Stated Goal: regain her independence; open to rehab at d/c, 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 PT Goal Formulation: With patient Time For Goal Achievement: 11/20/19 Potential to Achieve Goals: Good Progress towards PT goals: Progressing toward goals    Frequency    Min 3X/week      PT Plan Current plan remains appropriate    Co-evaluation PT/OT/SLP Co-Evaluation/Treatment: Yes            AM-PAC PT "6 Clicks" Mobility   Outcome Measure   Help needed turning from your back to your side while in a flat bed without using bedrails?: A Little Help needed moving from lying on your back to sitting on the side of a flat bed without using bedrails?: A Little Help needed moving to and from a bed to a chair (including a wheelchair)?: A Lot Help needed standing up from a chair using your arms (e.g., wheelchair or bedside chair)?: A Lot Help needed to walk in hospital room?: Total Help needed climbing 3-5 steps with a railing? : Total 6 Click Score: 12    End of Session Equipment Utilized During Treatment: Gait belt Activity Tolerance: Patient limited by fatigue Patient left: with call bell/phone within reach;in chair Nurse Communication: Mobility status PT Visit Diagnosis: Other abnormalities of gait and mobility (R26.89);Unsteadiness on feet (R26.81);Muscle weakness (generalized) (M62.81)     Time: 1322-1400 PT Time Calculation (min) (ACUTE ONLY): 38 min  Charges:  $Therapeutic Exercise: 8-22 mins $Therapeutic Activity: 23-37 mins                     Blondell Reveal Kistler PT 11/09/2019  Acute Rehabilitation Services Pager [phone] Office [phone]

## 2019-11-10 DIAGNOSIS — N1832 Chronic kidney disease, stage 3b: Secondary | ICD-10-CM

## 2019-11-10 DIAGNOSIS — N183 Chronic kidney disease, stage 3 unspecified: Secondary | ICD-10-CM

## 2019-11-10 LAB — CULTURE, BLOOD (ROUTINE X 2)
Culture: NO GROWTH
Special Requests: ADEQUATE

## 2019-11-10 LAB — CBC
HCT: 25.9 % — ABNORMAL LOW (ref 36.0–46.0)
Hemoglobin: 8.2 g/dL — ABNORMAL LOW (ref 12.0–15.0)
MCH: 31.8 pg (ref 26.0–34.0)
MCHC: 31.7 g/dL (ref 30.0–36.0)
MCV: 100.4 fL — ABNORMAL HIGH (ref 80.0–100.0)
Platelets: 301 10*3/uL (ref 150–400)
RBC: 2.58 MIL/uL — ABNORMAL LOW (ref 3.87–5.11)
RDW: 17.2 % — ABNORMAL HIGH (ref 11.5–15.5)
WBC: 6 10*3/uL (ref 4.0–10.5)
nRBC: 0.7 % — ABNORMAL HIGH (ref 0.0–0.2)

## 2019-11-10 LAB — BASIC METABOLIC PANEL
Anion gap: 13 (ref 5–15)
BUN: 101 mg/dL — ABNORMAL HIGH (ref 8–23)
CO2: 17 mmol/L — ABNORMAL LOW (ref 22–32)
Calcium: 9.2 mg/dL (ref 8.9–10.3)
Chloride: 108 mmol/L (ref 98–111)
Creatinine, Ser: 4.01 mg/dL — ABNORMAL HIGH (ref 0.44–1.00)
GFR calc Af Amer: 12 mL/min — ABNORMAL LOW (ref >60)
GFR calc non Af Amer: 11 mL/min — ABNORMAL LOW (ref >60)
Glucose, Bld: 109 mg/dL — ABNORMAL HIGH (ref 70–99)
Potassium: 4.7 mmol/L (ref 3.5–5.1)
Sodium: 138 mmol/L (ref 135–145)

## 2019-11-10 NOTE — Progress Notes (Signed)
PROGRESS NOTE    Diamond [name]  FBP:[id number] DOB: [birth year] DOA: 11/05/2019 PCP: [name], MD   Brief Narrative: 68 year old female with history of recent Covid infection 10/16/2019, essential hypertension, asthma, CKD stage III, gout presented with generalized weakness, left leg and foot swelling, not being able to ambulate due to swelling in the left leg. Patient already received a last admission were found to have, followed by nephrology, creatinine trended up to 3.8 baseline 2.2 felt to be COVID-19 infection had taken remdesivir steroids, hospital course complicated by A. fib that converted to NSR after verapamil and atenolol which is her home medication patient was on heparin then transition to Eliquis and renal function improved at the time of discharge. Patient notes history of gout treated with colchicine.  Subjective:  No acute events overnight.  Resting comfortably on room air.   Denies any melena.  Assessment & Plan:   AKI on CKD stage IIIb: Suspecting more subacute/chronic than acute as per nephrology. On recent imaging negative for acute structural problems.  Patient had recent diarrhea could have caused hypovolemia and prerenal presentation. No indication for acute dialysis at this time.  Seen by nephrology, placed on bicarb, and signed off- advised outpatient follow-up.  Creatinine is downtrending at 4.22 but BUN is going up likely in the setting of steroid use/CKD, no further drop in hemoglobin to suggest bleeding. Recent Labs  Lab 11/06/19 0742 11/07/19 0347 11/08/19 0408 11/09/19 0404 11/10/19 0320  BUN 65* 76* 93* 91* 101*  CREATININE 4.87* 4.60* 4.51* 4.22* 4.01*   Anemia, likely from chronic disease, with low ferritin: Suspect multifactorial due to recent Covid infection/CKD,status post Feraheme, B12 on lower side 222-continue B12 supplementation orally.  Hemoccult was checked and is negative. Of note she is on Eliquis- given her CKD discussed w/  pharmacy to adjust the dose. Patient wants to continue the Eliquis for her A. fib for now.  Status post 1 unit PRBC hemoglobin stable at 8.2 g and we will continue to monitor daily.  Discussed with the patient if furhter drop in Hb may need GI work-up/eval  Metabolic acidosis, current bicarb at 14->15->17, improving,cont on po bicarb .  Acute on chronic gout: Monitor stable, continue Tylenol.  Will stop Steroid- watch BUN  AF, persistent:rate controlled. on Eliquis, metoprolol and verapamil.  Patient had RVR on previous admission at [facility] that converted to normal TSH stable.  Echo with EF 60 to 65%. CHADS2VASC score is 3.   Hypertension: Blood pressure is fairly stable.  On verapamil and metoprolol for A. fib.  Body mass index is 29.32 kg/m.   DVT prophylaxis: Eliquis  Code Status: full Family Communication: plan of care discussed with patient at bedside. Disposition Plan: Remains inpatient pending further CIR placement.  Awaiting for insurance approval.    Consultants: nephro Procedures: 2D echocardiogram 12/1 1. Left ventricular ejection fraction, by visual estimation, is 60 to 65%. The left ventricle has normal function. There is no left ventricular hypertrophy.  2. Left ventricular diastolic parameters are indeterminate.  3. Global right ventricle has normal systolic function.The right ventricular size is normal. No increase in right ventricular wall thickness.  4. Left atrial size was mildly dilated.  5. Right atrial size was normal.  6. Mild mitral annular calcification.  7. Moderate thickening of the mitral valve leaflet(s).  8. The mitral valve is normal in structure. Mild mitral valve regurgitation. No evidence of mitral stenosis.  9. The tricuspid valve is normal in structure. Tricuspid valve regurgitation moderate-severe. 10.  The aortic valve is tricuspid. Aortic valve regurgitation is not visualized. Sclerosis. 11. There is Moderate calcification of the aortic valve. 12.  There is Moderate thickening of the aortic valve. 13. The pulmonic valve was grossly normal. Pulmonic valve regurgitation is mild. 14. Aortic dilatation noted. 15. There is mild dilatation of the aortic root measuring 39 mm. 16. Normal pulmonary artery systolic pressure. 17. The inferior vena cava is normal in size with greater than 50% respiratory variability, suggesting right atrial pressure of 3 mmHg.  Microbiology:  Antimicrobials: Anti-infectives (From admission, onward)   None       Objective: Vitals:   11/09/19 1937 11/09/19 2216 11/10/19 0529 11/10/19 0844  BP:  (!) 147/95 (!) 159/90   Pulse:  76    Resp:  17 18   Temp:  98.3 F (36.8 C) 99 F (37.2 C)   TempSrc:  Oral Oral   SpO2: 99% 100% 97% 96%  Weight:      Height:        Intake/Output Summary (Last 24 hours) at 11/10/2019 1145 Last data filed at 11/10/2019 1029 Gross per 24 hour  Intake 1200 ml  Output 1400 ml  Net -200 ml   Filed Weights   11/06/19 1353  Weight: 84.9 kg   Weight change:   Body mass index is 29.32 kg/m.  Intake/Output from previous day: 12/04 0701 - 12/05 0700 In: 1200 [P.O.:1200] Out: 1900 [Urine:1900] Intake/Output this shift: Total I/O In: 240 [P.O.:240] Out: -   Examination:  General exam: Alert oriented x3 not in acute distress on room air.   HEENT:Oral mucosa moist, Ear/Nose WNL grossly, dentition normal. Respiratory system: Clear bilaterally with no crackles, no use of accessory muscle.   Cardiovascular system: S1 & S2 +, No JVD,. Gastrointestinal system: Abdomen soft,obese, NT,ND, BS+ Nervous System:Alert, awake, moving extremities and grossly nonfocal Extremities: mild lower leg edema b/l. Chronic. distal peripheral pulses palpable.  Skin: No rashes,no icterus. MSK: Normal muscle bulk,tone, power  Medications:  Scheduled Meds:  sodium chloride   Intravenous Once   apixaban  2.5 mg Oral BID   metoprolol tartrate  25 mg Oral BID   mometasone-formoterol  2  puff Inhalation BID   sodium bicarbonate  1,300 mg Oral BID   verapamil  120 mg Oral TID   vitamin B-12  500 mcg Oral Daily   Continuous Infusions:  Data Reviewed: I have personally reviewed following labs and imaging studies  CBC: Recent Labs  Lab 11/05/19 1455 11/05/19 2357 11/07/19 0347 11/08/19 0408 11/08/19 2115 11/09/19 0404 11/10/19 0320  WBC 7.6 7.3 6.7 7.7  --  6.3 6.0  NEUTROABS 6.4  --   --   --   --   --   --   HGB 8.6* 7.7* 7.0* 6.9* 8.8* 8.2* 8.2*  HCT 28.7* 24.2* 22.5* 22.0* 27.8* 25.3* 25.9*  MCV 107.9* 102.1* 102.7* 103.3*  --  100.4* 100.4*  PLT 101* 111* 159 233  --  271 967   Basic Metabolic Panel: Recent Labs  Lab 11/05/19 1644  11/05/19 2357 11/06/19 0742 11/07/19 0347 11/08/19 0408 11/09/19 0404 11/10/19 0320  NA  --    < > 139 143 138 137 136 138  K  --    < > 4.2 4.5 4.7 4.7 5.0 4.7  CL  --    < > 110 113* 111 108 106 108  CO2  --    < > 17* 14* 14* 14* 15* 17*  GLUCOSE  --    < >  137* 109* 114* 111* 115* 109*  BUN  --    < > 66* 65* 76* 93* 91* 101*  CREATININE  --    < > 5.03* 4.87* 4.60* 4.51* 4.22* 4.01*  CALCIUM  --    < > 9.0 8.8* 9.4 9.4 9.0 9.2  MG 2.0  --  2.0  --   --   --   --   --   PHOS  --   --  6.7*  --   --   --   --   --    < > = values in this interval not displayed.   GFR: Estimated Creatinine Clearance: 14.8 mL/min (A) (by C-G formula based on SCr of 4.01 mg/dL (H)). Liver Function Tests: Recent Labs  Lab 11/05/19 1455 11/05/19 2357  AST 37 19  ALT 26 25  ALKPHOS 60 59  BILITOT 0.9 0.4  PROT 7.5 7.0  ALBUMIN 2.4* 2.2*   No results for input(s): LIPASE, AMYLASE in the last 168 hours. No results for input(s): AMMONIA in the last 168 hours. Coagulation Profile: No results for input(s): INR, PROTIME in the last 168 hours. Cardiac Enzymes: Recent Labs  Lab 11/05/19 1644  CKTOTAL 158   BNP (last 3 results) No results for input(s): PROBNP in the last 8760 hours. HbA1C: No results for input(s): HGBA1C  in the last 72 hours. CBG: No results for input(s): GLUCAP in the last 168 hours. Lipid Profile: No results for input(s): CHOL, HDL, LDLCALC, TRIG, CHOLHDL, LDLDIRECT in the last 72 hours. Thyroid Function Tests: No results for input(s): TSH, T4TOTAL, FREET4, T3FREE, THYROIDAB in the last 72 hours. Anemia Panel: No results for input(s): VITAMINB12, FOLATE, FERRITIN, TIBC, IRON, RETICCTPCT in the last 72 hours. Sepsis Labs: Recent Labs  Lab 11/05/19 1455 11/05/19 1830 11/05/19 2118 11/05/19 2357  LATICACIDVEN 2.3* 2.3* 2.4* 1.3    Recent Results (from the past 240 hour(s))  Culture, blood (routine x 2)     Status: None (Preliminary result)   Collection Time: 11/05/19  4:13 PM   Specimen: BLOOD RIGHT HAND  Result Value Ref Range Status   Specimen Description   Final    BLOOD RIGHT HAND Performed at [facility], [place] [address], [place], [place] [address]    Special Requests   Final    BOTTLES DRAWN AEROBIC ONLY Blood Culture adequate volume Performed at [facility] [address], [place], [place] [address]    Culture   Final    NO GROWTH 4 DAYS Performed at [facility], [place] [address] [place], [place] [address]    Report Status PENDING  Incomplete  SARS CORONAVIRUS 2 (TAT 6-24 HRS) Nasopharyngeal Nasopharyngeal Swab     Status: None   Collection Time: 11/05/19  9:52 PM   Specimen: Nasopharyngeal Swab  Result Value Ref Range Status   SARS Coronavirus 2 NEGATIVE NEGATIVE Final    Comment: (NOTE) SARS-CoV-2 target nucleic acids are NOT DETECTED. The SARS-CoV-2 RNA is generally detectable in upper and lower respiratory specimens during the acute phase of infection. Negative results do not preclude SARS-CoV-2 infection, do not rule out co-infections with other pathogens, and should not be used as the sole basis for treatment or other patient management decisions. Negative results must be combined with clinical  observations, patient history, and epidemiological information. The expected result is Negative. Fact Sheet for Patients: SugarRoll.be Fact Sheet for Healthcare Providers: https://www.woods-mathews.com/ This test is not yet approved or cleared by the  [place] Architectural technologist and  has been authorized for detection and/or diagnosis of SARS-CoV-2 by FDA under an Print production planner (EUA). This EUA will remain  in effect (meaning this test can be used) for the duration of the COVID-19 declaration under Section 56 4(b)(1) of the Act, 21 U.S.C. section 360bbb-3(b)(1), unless the authorization is terminated or revoked sooner. Performed at [facility], [place] [address] [place], [place] [address]   Culture, blood (routine x 2)     Status: None (Preliminary result)   Collection Time: 11/05/19 11:57 PM   Specimen: BLOOD  Result Value Ref Range Status   Specimen Description   Final    BLOOD LEFT ANTECUBITAL Performed at [facility] [address] [place], [place] [address]    Special Requests   Final    BOTTLES DRAWN AEROBIC ONLY Blood Culture adequate volume Performed at [facility] [address] [place], [place] [address]    Culture   Final    NO GROWTH 3 DAYS Performed at [facility], [place] [address] [place], [place] [address]    Report Status PENDING  Incomplete      Radiology Studies: No results found.    LOS: 5 days   Time spent: More than 50% of that time was spent in counseling and/or coordination of care.  Antonieta Pert, MD [facility]  11/10/2019, 11:45 AM

## 2019-11-10 NOTE — Progress Notes (Signed)
Pt converted to A-fib 80's, sitting in chair, denies pain, meds administered as ordered. MD made aware. SRP, RN

## 2019-11-11 LAB — BASIC METABOLIC PANEL
Anion gap: 15 (ref 5–15)
BUN: 101 mg/dL — ABNORMAL HIGH (ref 8–23)
CO2: 18 mmol/L — ABNORMAL LOW (ref 22–32)
Calcium: 9.2 mg/dL (ref 8.9–10.3)
Chloride: 105 mmol/L (ref 98–111)
Creatinine, Ser: 3.96 mg/dL — ABNORMAL HIGH (ref 0.44–1.00)
GFR calc Af Amer: 13 mL/min — ABNORMAL LOW (ref >60)
GFR calc non Af Amer: 11 mL/min — ABNORMAL LOW (ref >60)
Glucose, Bld: 96 mg/dL (ref 70–99)
Potassium: 4.7 mmol/L (ref 3.5–5.1)
Sodium: 138 mmol/L (ref 135–145)

## 2019-11-11 LAB — CULTURE, BLOOD (ROUTINE X 2)
Culture: NO GROWTH
Special Requests: ADEQUATE

## 2019-11-11 LAB — CBC
HCT: 27.1 % — ABNORMAL LOW (ref 36.0–46.0)
Hemoglobin: 8.6 g/dL — ABNORMAL LOW (ref 12.0–15.0)
MCH: 31.7 pg (ref 26.0–34.0)
MCHC: 31.7 g/dL (ref 30.0–36.0)
MCV: 100 fL (ref 80.0–100.0)
Platelets: 374 10*3/uL (ref 150–400)
RBC: 2.71 MIL/uL — ABNORMAL LOW (ref 3.87–5.11)
RDW: 17.5 % — ABNORMAL HIGH (ref 11.5–15.5)
WBC: 6.7 10*3/uL (ref 4.0–10.5)
nRBC: 0.3 % — ABNORMAL HIGH (ref 0.0–0.2)

## 2019-11-11 MED ORDER — LIP MEDEX EX OINT
TOPICAL_OINTMENT | CUTANEOUS | Status: AC
  Administered 2019-11-11: 1
  Filled 2019-11-11: qty 7

## 2019-11-11 MED ORDER — LABETALOL HCL 5 MG/ML IV SOLN
10.0000 mg | Freq: Four times a day (QID) | INTRAVENOUS | Status: DC | PRN
  Administered 2019-11-12: 10 mg via INTRAVENOUS
  Filled 2019-11-11: qty 4

## 2019-11-11 MED ORDER — DOCUSATE SODIUM 100 MG PO CAPS: 100.0000 mg | ORAL_CAPSULE | Freq: Every day | ORAL | Status: DC | PRN

## 2019-11-11 NOTE — Progress Notes (Signed)
Occupational Therapy Treatment Patient Details Name: Diamond [name] MRN: [id number] DOB: 12-23-1950 Today's Date: 11/11/2019    History of present illness Pt is a 68 y.o. female with PMHx including recent COVID infection diagnosed 10/16/2019 (found to have AKI during this admission), HTN, well-controlled asthma, CKD, gout who presented with progressive generalized weakness, swelling in L leg/foot and inability to ambulate. Admitted for further work-up.   OT comments  Pt progressing towards OT goals this session. Pt eager to participate in therapy and able to perform transfers x8 (sit<>stand and stand pivot) throughout session with seated rest breaks as well as LB bathing (mod A), UB bathing (min A for lower back), toilet transfer and peri care. Pt continues to demonstrate decreased activity tolerance and repeating questions/requiring multiple cues for safety with RW. At this time CIR is the best post-acute venue as she will require intensive therapy to maximize safety and independence in ADL and functional transfers.    Follow Up Recommendations  CIR    Equipment Recommendations  Other (comment)(defer to next venue of care)    Recommendations for Other Services      Precautions / Restrictions Precautions Precautions: Fall Restrictions Weight Bearing Restrictions: No       Mobility Bed Mobility Overal bed mobility: Needs Assistance Bed Mobility: Supine to Sit;Sit to Supine     Supine to sit: HOB elevated;Supervision Sit to supine: Mod assist(for BLE back into bed, Pt able to assist with pulling up)   General bed mobility comments: increased time required  Transfers Overall transfer level: Needs assistance Equipment used: Rolling walker (2 wheeled) Transfers: Sit to/from [employer] Transfers Sit to Stand: From elevated surface;Min assist Stand pivot transfers: Min assist       General transfer comment: vc for safe hand placement multiple times. Pt able to perform  sit<>stand x8 throughout the session from EOB and BSC    Balance Overall balance assessment: Needs assistance Sitting-balance support: Feet supported;Bilateral upper extremity supported Sitting balance-Leahy Scale: Fair Sitting balance - Comments: leans on elbows at times; fatigues easily   Standing balance support: Bilateral upper extremity supported Standing balance-Leahy Scale: Poor Standing balance comment: UE support/external assist                           ADL either performed or assessed with clinical judgement   ADL Overall ADL's : Needs assistance/impaired     Grooming: Wash/dry hands;Wash/dry face;Set up;Sitting Grooming Details (indicate cue type and reason): on BSC Upper Body Bathing: Minimal assistance;Sitting Upper Body Bathing Details (indicate cue type and reason): for back while on BSC Lower Body Bathing: Minimal assistance;Sitting/lateral leans Lower Body Bathing Details (indicate cue type and reason): able to reach down to ankles on R side, but not left         Toilet Transfer: Minimal assistance;Stand-pivot;Cueing for sequencing;Cueing for safety;BSC;RW Toilet Transfer Details (indicate cue type and reason): vc for hand placement, sequencing, and min A for boost and balance Toileting- Clothing Manipulation and Hygiene: Moderate assistance;Sit to/from stand Toileting - Clothing Manipulation Details (indicate cue type and reason): Pt able to perform front peri care from sitting on BSC with leg propped up on RW, then she was able to stand one time and perform rear peri care, but a second time she required max A fro rear peri care     Functional mobility during ADLs: Minimal assistance;Rolling walker;Cueing for sequencing General ADL Comments: pt limited due to pain, weakness  Vision       Perception     Praxis      Cognition Arousal/Alertness: Awake/alert Behavior During Therapy: WFL for tasks assessed/performed Overall Cognitive  Status: Impaired/Different from baseline Area of Impairment: Problem solving                             Problem Solving: Slow processing;Decreased initiation;Requires verbal cues General Comments: Pt struggled with simple exercises and required verbal cues and demonstration, She was very motivated and pleasant, when I asked what she would like to do (gave 3 ADL options) she said "I don't know...arn't you supposed to tell me what to do?"        Exercises Exercises: Other exercises Other Exercises Other Exercises: sit<>stand x8   Shoulder Instructions       General Comments      Pertinent Vitals/ Pain       Pain Assessment: 0-10 Pain Score: 5  Faces Pain Scale: Hurts little more Pain Location: L foot in standing Pain Descriptors / Indicators: Aching;Sore;Tender Pain Intervention(s): Monitored during session;Repositioned;Premedicated before session  Home Living                                          Prior Functioning/Environment              Frequency  Min 2X/week        Progress Toward Goals  OT Goals(current goals can now be found in the care plan section)  Progress towards OT goals: Progressing toward goals  Acute Rehab OT Goals Patient Stated Goal: regain her independence; open to rehab at d/c, 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 OT Goal Formulation: With patient Time For Goal Achievement: 11/20/19 Potential to Achieve Goals: Good  Plan Discharge plan needs to be updated;Frequency remains appropriate    Co-evaluation                 AM-PAC OT "6 Clicks" Daily Activity     Outcome Measure   Help from another person eating meals?: A Little Help from another person taking care of personal grooming?: A Little Help from another person toileting, which includes using toliet, bedpan, or urinal?: A Lot Help from another person bathing (including washing, rinsing, drying)?: A Lot Help from another person to put on and  taking off regular upper body clothing?: A Little Help from another person to put on and taking off regular lower body clothing?: A Lot 6 Click Score: 15    End of Session Equipment Utilized During Treatment: Rolling walker;Gait belt  OT Visit Diagnosis: Unsteadiness on feet (R26.81);Other abnormalities of gait and mobility (R26.89);Pain Pain - Right/Left: Left Pain - part of body: Knee;Leg;Ankle and joints of foot   Activity Tolerance Patient tolerated treatment well   Patient Left in bed;with call bell/phone within reach;with bed alarm set   Nurse Communication Mobility status(purewick needs to be put back in place)        Time: 1062-6948 OT Time Calculation (min): 35 min  Charges: OT General Charges $OT Visit: 1 Visit OT Treatments $Self Care/Home Management : 8-22 mins $Therapeutic Activity: 8-22 mins  Hulda Humphrey OTR/L Acute Rehabilitation Services Pager: [phone] Office: [place] 11/11/2019, 5:28 PM

## 2019-11-11 NOTE — Progress Notes (Signed)
Per Judson Roch, she will need two neg covid for CIR admit, will order COVID now. Her last COVID was negative Nov 30.

## 2019-11-11 NOTE — Progress Notes (Signed)
PROGRESS NOTE    Diamond [name]  OVF:[id number] DOB: [birth year] DOA: 11/05/2019 PCP: [name], MD   Brief Narrative: 68 year old female with history of recent Covid infection 11/take/2020, essential hypertension, asthma, CKD stage III, gout presented with generalized weakness, left leg and foot swelling, not being able to ambulate due to swelling in the left leg. Patient already received a last admission were found to have, followed by nephrology, creatinine trended up to 3.8 baseline 2.2 felt to be COVID-19 infection had taken remdesivir steroids, hospital course complicated by A. fib that converted to NSR after verapamil and atenolol which is her home medication patient was on heparin then transition to Eliquis and renal function improved at the time of discharge. Patient notes history of gout treated with colchicine.  Subjective: Patient resting comfortably. Denies any nausea vomiting fever chills. No bowel movement - she is reluctant to take laxatives. Hemoglobin is stable 8.2 g. Assessment & Plan:   AKI on CKD stage IIIb: Suspecting more subacute/chronic than acute as per nephrology. On recent imaging negative for acute structural problems.  Patient had recent diarrhea could have caused hypovolemia and prerenal presentation. No indication for acute dialysis at this time.  Seen by nephrology, placed on bicarb, and signed off- advised outpatient follow-up.  Creatinine is downtrending at  3.9, BUN pending.> BUN up with steroid use/CKD, no further drop in hemoglobin to suggest bleeding. Recent Labs  Lab 11/07/19 0347 11/08/19 0408 11/09/19 0404 11/10/19 0320 11/11/19 0505  BUN 76* 93* 91* 101* PENDING  CREATININE 4.60* 4.51* 4.22* 4.01* 3.96*   Anemia, likely from chronic disease, with low ferritin: Suspect multifactorial due to recent Covid infection/CKD,status post Feraheme, B12 on lower side 222-continue B12 supplementation orally.  Hemoccult was checked and is negative.  Of note she is on Eliquis- given her CKD discussed w/ pharmacy and decreased Eliquis to 2.5 twice daily.Status post 1 unit PRBC hemoglobin stable and up at 8.6 gm.  Consider outpatient follow-up with GI.  Recent Labs  Lab 11/08/19 0408 11/08/19 2115 11/09/19 0404 11/10/19 0320 11/11/19 0505  HGB 6.9* 8.8* 8.2* 8.2* 8.6*  HCT 22.0* 27.8* 25.3* 84.1* 66.0*   Metabolic acidosis, current bicarb at 14->15->17, improving,cont on po bicarb .  Acute on chronic gout: Monitor stable, continue Tylenol.  Will stop Steroid- watch BUN  AF, persistent:rate controlled.  Back in A. Fib. Cont Eliquis-renal dose, continue her metoprolol and verapamil.  Patient had RVR on previous admission at [facility] that converted to normal TSH stable.  Echo with EF 60 to 65%. CHADS2VASC score is 3.    Hypertension: Blood pressure is fairly stable.  On verapamil and metoprolol for A. fib.  Body mass index is 29.32 kg/m.   DVT prophylaxis: Eliquis  Code Status: full Family Communication: plan of care discussed with patient at bedside. Disposition Plan: Remains inpatient pending further CIR placement.  Awaiting for insurance approval.    Consultants: nephro Procedures: 2D echocardiogram 12/1 1. Left ventricular ejection fraction, by visual estimation, is 60 to 65%. The left ventricle has normal function. There is no left ventricular hypertrophy.  2. Left ventricular diastolic parameters are indeterminate.  3. Global right ventricle has normal systolic function.The right ventricular size is normal. No increase in right ventricular wall thickness.  4. Left atrial size was mildly dilated.  5. Right atrial size was normal.  6. Mild mitral annular calcification.  7. Moderate thickening of the mitral valve leaflet(s).  8. The mitral valve is normal in structure. Mild mitral valve regurgitation. No  evidence of mitral stenosis.  9. The tricuspid valve is normal in structure. Tricuspid valve regurgitation moderate-severe. 10.  The aortic valve is tricuspid. Aortic valve regurgitation is not visualized. Sclerosis. 11. There is Moderate calcification of the aortic valve. 12. There is Moderate thickening of the aortic valve. 13. The pulmonic valve was grossly normal. Pulmonic valve regurgitation is mild. 14. Aortic dilatation noted. 15. There is mild dilatation of the aortic root measuring 39 mm. 16. Normal pulmonary artery systolic pressure. 17. The inferior vena cava is normal in size with greater than 50% respiratory variability, suggesting right atrial pressure of 3 mmHg.  Microbiology:  Antimicrobials: Anti-infectives (From admission, onward)   None       Objective: Vitals:   11/11/19 0714 11/11/19 0801 11/11/19 0953 11/11/19 1326  BP: (!) 177/130  (!) 144/111 (!) 139/94  Pulse: 97 75 (!) 104 76  Resp:  17    Temp:    98.2 F (36.8 C)  TempSrc:    Oral  SpO2:  100%  100%  Weight:      Height:        Intake/Output Summary (Last 24 hours) at 11/11/2019 1428 Last data filed at 11/11/2019 0852 Gross per 24 hour  Intake 240 ml  Output 1790 ml  Net -1550 ml   Filed Weights   11/06/19 1353  Weight: 84.9 kg   Weight change:   Body mass index is 29.32 kg/m.  Intake/Output from previous day: 12/05 0701 - 12/06 0700 In: 240 [P.O.:240] Out: 1790 [Urine:1790] Intake/Output this shift: Total I/O In: 240 [P.O.:240] Out: -   Examination:  General exam: AAOX3, NAD, Obese, on RA. HEENT:Oral mucosa moist, Ear/Nose WNL grossly, dentition normal. Respiratory system: b/l clear with no wheezing or crackles, no use of accessory muscle.   Cardiovascular system: S1 & S2 +, No JVD,. Gastrointestinal system: Abdomen soft,obese, NT,ND, BS+ Nervous System:Alert, awake, moving extremities and grossly nonfocal Extremities: mild lower leg edema b/l. Chronic. distal peripheral pulses palpable.  Skin: No rashes,no icterus. MSK: Normal muscle bulk,tone, power  Medications:  Scheduled Meds:  sodium  chloride   Intravenous Once   apixaban  2.5 mg Oral BID   metoprolol tartrate  25 mg Oral BID   mometasone-formoterol  2 puff Inhalation BID   sodium bicarbonate  1,300 mg Oral BID   verapamil  120 mg Oral TID   vitamin B-12  500 mcg Oral Daily   Continuous Infusions:  Data Reviewed: I have personally reviewed following labs and imaging studies  CBC: Recent Labs  Lab 11/05/19 1455  11/07/19 0347 11/08/19 0408 11/08/19 2115 11/09/19 0404 11/10/19 0320 11/11/19 0505  WBC 7.6   < > 6.7 7.7  --  6.3 6.0 6.7  NEUTROABS 6.4  --   --   --   --   --   --   --   HGB 8.6*   < > 7.0* 6.9* 8.8* 8.2* 8.2* 8.6*  HCT 28.7*   < > 22.5* 22.0* 27.8* 25.3* 25.9* 27.1*  MCV 107.9*   < > 102.7* 103.3*  --  100.4* 100.4* 100.0  PLT 101*   < > 159 233  --  271 301 374   < > = values in this interval not displayed.   Basic Metabolic Panel: Recent Labs  Lab 11/05/19 1644  11/05/19 2357  11/07/19 0347 11/08/19 0408 11/09/19 0404 11/10/19 0320 11/11/19 0505  NA  --    < > 139   < > 138 137 136 138  138  K  --    < > 4.2   < > 4.7 4.7 5.0 4.7 4.7  CL  --    < > 110   < > 111 108 106 108 105  CO2  --    < > 17*   < > 14* 14* 15* 17* 18*  GLUCOSE  --    < > 137*   < > 114* 111* 115* 109* 96  BUN  --    < > 66*   < > 76* 93* 91* 101* PENDING  CREATININE  --    < > 5.03*   < > 4.60* 4.51* 4.22* 4.01* 3.96*  CALCIUM  --    < > 9.0   < > 9.4 9.4 9.0 9.2 9.2  MG 2.0  --  2.0  --   --   --   --   --   --   PHOS  --   --  6.7*  --   --   --   --   --   --    < > = values in this interval not displayed.   GFR: Estimated Creatinine Clearance: 15 mL/min (A) (by C-G formula based on SCr of 3.96 mg/dL (H)). Liver Function Tests: Recent Labs  Lab 11/05/19 1455 11/05/19 2357  AST 37 19  ALT 26 25  ALKPHOS 60 59  BILITOT 0.9 0.4  PROT 7.5 7.0  ALBUMIN 2.4* 2.2*   No results for input(s): LIPASE, AMYLASE in the last 168 hours. No results for input(s): AMMONIA in the last 168  hours. Coagulation Profile: No results for input(s): INR, PROTIME in the last 168 hours. Cardiac Enzymes: Recent Labs  Lab 11/05/19 1644  CKTOTAL 158   BNP (last 3 results) No results for input(s): PROBNP in the last 8760 hours. HbA1C: No results for input(s): HGBA1C in the last 72 hours. CBG: No results for input(s): GLUCAP in the last 168 hours. Lipid Profile: No results for input(s): CHOL, HDL, LDLCALC, TRIG, CHOLHDL, LDLDIRECT in the last 72 hours. Thyroid Function Tests: No results for input(s): TSH, T4TOTAL, FREET4, T3FREE, THYROIDAB in the last 72 hours. Anemia Panel: No results for input(s): VITAMINB12, FOLATE, FERRITIN, TIBC, IRON, RETICCTPCT in the last 72 hours. Sepsis Labs: Recent Labs  Lab 11/05/19 1455 11/05/19 1830 11/05/19 2118 11/05/19 2357  LATICACIDVEN 2.3* 2.3* 2.4* 1.3    Recent Results (from the past 240 hour(s))  Culture, blood (routine x 2)     Status: None   Collection Time: 11/05/19  4:13 PM   Specimen: BLOOD RIGHT HAND  Result Value Ref Range Status   Specimen Description   Final    BLOOD RIGHT HAND Performed at [facility], [place] [address] [place], [place] [address]    Special Requests   Final    BOTTLES DRAWN AEROBIC ONLY Blood Culture adequate volume Performed at [place] [address] [place], [place] [address]    Culture   Final    NO GROWTH 5 DAYS Performed at [facility], [place] [address] [place], [place] [address]    Report Status 11/10/2019 FINAL  Final  SARS CORONAVIRUS 2 (TAT 6-24 HRS) Nasopharyngeal Nasopharyngeal Swab     Status: None   Collection Time: 11/05/19  9:52 PM   Specimen: Nasopharyngeal Swab  Result Value Ref Range Status   SARS Coronavirus 2 NEGATIVE NEGATIVE Final    Comment: (NOTE) SARS-CoV-2 target nucleic acids are NOT DETECTED. The  SARS-CoV-2 RNA is generally detectable in upper and lower respiratory specimens during the acute phase of infection.  Negative results do not preclude SARS-CoV-2 infection, do not rule out co-infections with other pathogens, and should not be used as the sole basis for treatment or other patient management decisions. Negative results must be combined with clinical observations, patient history, and epidemiological information. The expected result is Negative. Fact Sheet for Patients: SugarRoll.be Fact Sheet for Healthcare Providers: https://www.woods-mathews.com/ This test is not yet approved or cleared by the [place] FDA and  has been authorized for detection and/or diagnosis of SARS-CoV-2 by FDA under an Emergency Use Authorization (EUA). This EUA will remain  in effect (meaning this test can be used) for the duration of the COVID-19 declaration under Section 56 4(b)(1) of the Act, 21 U.S.C. section 360bbb-3(b)(1), unless the authorization is terminated or revoked sooner. Performed at [facility], [place] [address], [place], [place] [address]   Culture, blood (routine x 2)     Status: None   Collection Time: 11/05/19 11:57 PM   Specimen: BLOOD  Result Value Ref Range Status   Specimen Description   Final    BLOOD LEFT ANTECUBITAL Performed at [place] [address], [place], [place] [address]    Special Requests   Final    BOTTLES DRAWN AEROBIC ONLY Blood Culture adequate volume Performed at [place] [address] [place], [place] [address]    Culture   Final    NO GROWTH 5 DAYS Performed at [facility], [place] [address] [place], [place] [address]    Report Status 11/11/2019 FINAL  Final      Radiology Studies: No results found.    LOS: 6 days   Time spent: More than 50% of that time was spent in counseling and/or coordination of care.  Antonieta Pert, MD [facility]  11/11/2019, 2:28 PM

## 2019-11-12 LAB — CBC
HCT: 28.7 % — ABNORMAL LOW (ref 36.0–46.0)
Hemoglobin: 8.9 g/dL — ABNORMAL LOW (ref 12.0–15.0)
MCH: 31.7 pg (ref 26.0–34.0)
MCHC: 31 g/dL (ref 30.0–36.0)
MCV: 102.1 fL — ABNORMAL HIGH (ref 80.0–100.0)
Platelets: 364 10*3/uL (ref 150–400)
RBC: 2.81 MIL/uL — ABNORMAL LOW (ref 3.87–5.11)
RDW: 18 % — ABNORMAL HIGH (ref 11.5–15.5)
WBC: 6.2 10*3/uL (ref 4.0–10.5)
nRBC: 0.5 % — ABNORMAL HIGH (ref 0.0–0.2)

## 2019-11-12 LAB — BASIC METABOLIC PANEL
Anion gap: 10 (ref 5–15)
BUN: 98 mg/dL — ABNORMAL HIGH (ref 8–23)
CO2: 22 mmol/L (ref 22–32)
Calcium: 9.1 mg/dL (ref 8.9–10.3)
Chloride: 108 mmol/L (ref 98–111)
Creatinine, Ser: 3.62 mg/dL — ABNORMAL HIGH (ref 0.44–1.00)
GFR calc Af Amer: 14 mL/min — ABNORMAL LOW (ref >60)
GFR calc non Af Amer: 12 mL/min — ABNORMAL LOW (ref >60)
Glucose, Bld: 86 mg/dL (ref 70–99)
Potassium: 4.4 mmol/L (ref 3.5–5.1)
Sodium: 140 mmol/L (ref 135–145)

## 2019-11-12 LAB — SARS CORONAVIRUS 2 (TAT 6-24 HRS): SARS Coronavirus 2: NEGATIVE

## 2019-11-12 MED ORDER — SODIUM BICARBONATE 650 MG PO TABS: 1300.0000 mg | ORAL_TABLET | Freq: Two times a day (BID) | ORAL | 0 refills | Status: AC

## 2019-11-12 MED ORDER — PREDNISONE 10 MG PO TABS
10.0000 mg | ORAL_TABLET | Freq: Every day | ORAL | Status: DC
  Administered 2019-11-12: 10 mg via ORAL
  Filled 2019-11-12: qty 1

## 2019-11-12 MED ORDER — TRAMADOL HCL 50 MG PO TABS
50.0000 mg | ORAL_TABLET | Freq: Two times a day (BID) | ORAL | Status: DC | PRN
  Administered 2019-11-12: 50 mg via ORAL
  Filled 2019-11-12: qty 1

## 2019-11-12 MED ORDER — APIXABAN 2.5 MG PO TABS: 5.0000 mg | ORAL_TABLET | Freq: Two times a day (BID) | ORAL | 0 refills | Status: DC

## 2019-11-12 MED ORDER — CYANOCOBALAMIN 500 MCG PO TABS: 500.0000 ug | ORAL_TABLET | Freq: Every day | ORAL | 0 refills | Status: AC

## 2019-11-12 MED ORDER — OXYBUTYNIN CHLORIDE 5 MG PO TABS: 5.0000 mg | ORAL_TABLET | Freq: Two times a day (BID) | ORAL | 0 refills | Status: AC | PRN

## 2019-11-12 MED ORDER — APIXABAN 2.5 MG PO TABS: 2.5000 mg | ORAL_TABLET | Freq: Two times a day (BID) | ORAL | 0 refills | Status: DC

## 2019-11-12 MED ORDER — PREDNISONE 10 MG PO TABS: 10.0000 mg | ORAL_TABLET | Freq: Every day | ORAL | 0 refills | Status: AC

## 2019-11-12 MED ORDER — TRAMADOL HCL 50 MG PO TABS: 50.0000 mg | ORAL_TABLET | Freq: Two times a day (BID) | ORAL | 0 refills | Status: DC | PRN

## 2019-11-12 MED ORDER — OXYBUTYNIN CHLORIDE 5 MG PO TABS
5.0000 mg | ORAL_TABLET | Freq: Two times a day (BID) | ORAL | Status: DC
  Administered 2019-11-12 (×2): 5 mg via ORAL
  Filled 2019-11-12 (×2): qty 1

## 2019-11-12 NOTE — Care Management Important Message (Signed)
Important Message  Patient Details IM Letter given to Cookie McGibboney RN to present to the Patient Name: Diamond [name] MRN: [id number] Date of Birth: [birth year]   Medicare Important Message Given:  Yes     Kerin Salen 11/12/2019, 11:40 AM

## 2019-11-12 NOTE — Progress Notes (Signed)
PT Cancellation Note  Patient Details Name: KHORI UNDERBERG MRN: [id number] DOB: [birth year]   Cancelled Treatment:    Reason Eval/Treat Not Completed: Pain limiting ability to participate. Attempted tx session-RN and pt requested PT check back another time. Will check back as schedule allows.   Weston Anna, PT Acute Rehabilitation Services Pager: [phone] Office: [phone]

## 2019-11-12 NOTE — TOC Progression Note (Signed)
Transition of Care [facility]) - Progression Note    Patient Details  Name: Diamond [name] MRN: [id number] Date of Birth: 1951/08/25  Transition of Care [facility]) CM/SW Contact  Purcell Mouton, RN Phone Number: 11/12/2019, 5:24 PM  Clinical Narrative:    Pt will transport home by [facility]/was called. 4 Wheel walker ordered from [employer].    Expected Discharge Plan: IP Rehab Facility Barriers to Discharge: No Barriers Identified  Expected Discharge Plan and Services Expected Discharge Plan: [place]   Discharge Planning Services: CM Consult   Living arrangements for the past 2 months: Single Family Home Expected Discharge Date: 11/12/19                                     Social Determinants of Health (SDOH) Interventions    Readmission Risk Interventions No flowsheet data found.

## 2019-11-12 NOTE — TOC Progression Note (Signed)
Transition of Care [facility]) - Progression Note    Patient Details  Name: Diamond [name] MRN: [id number] Date of Birth: 12/16/1950  Transition of Care [facility]) CM/SW Contact  Purcell Mouton, RN Phone Number: 11/12/2019, 3:27 PM  Clinical Narrative: Spoke with pt's daughter and son concerning discharge plans. All three agreed with taking pt home with [facility]. Checking HH agencies now for services.    Expected Discharge Plan: IP Rehab Facility Barriers to Discharge: No Barriers Identified  Expected Discharge Plan and Services Expected Discharge Plan: [place]   Discharge Planning Services: CM Consult   Living arrangements for the past 2 months: Single Family Home Expected Discharge Date: 11/12/19                                     Social Determinants of Health (SDOH) Interventions    Readmission Risk Interventions No flowsheet data found.

## 2019-11-12 NOTE — TOC Progression Note (Signed)
Transition of Care [facility]) - Progression Note    Patient Details  Name: Diamond [name] MRN: [id number] Date of Birth: [birth year]  Transition of Care [facility]) CM/SW Contact  Purcell Mouton, RN Phone Number: 11/12/2019, 4:30 PM  Clinical Narrative:    Alvis Lemm[facility] will follow pt for HHPT/OT. Unable to find [facility], HHRN related to agencies staffing and [facility] being out of network with most agencies. Asked if pt would like to be faxed to SNF/nursing home. Pt states "No I do not want to go to a Nursing Home." "This was not professionally done. I was not told." Explained to pt that [employer] declined CIR. Pt may appeal discharge.    Expected Discharge Plan: IP Rehab Facility Barriers to Discharge: No Barriers Identified  Expected Discharge Plan and Services Expected Discharge Plan: [place]   Discharge Planning Services: CM Consult   Living arrangements for the past 2 months: Single Family Home Expected Discharge Date: 11/12/19                                     Social Determinants of Health (SDOH) Interventions    Readmission Risk Interventions No flowsheet data found.

## 2019-11-12 NOTE — Progress Notes (Addendum)
Pt picked up by [facility] to be transported home.Pt stable at D/C.

## 2019-11-12 NOTE — Plan of Care (Signed)

## 2019-11-12 NOTE — Progress Notes (Addendum)
Inpatient Rehab Admissions Coordinator:   Notified by [employer] company they have denied initial request for CIR. Dr. Lupita Leash to do peer to peer to attempt to overturn denial.  Will follow.   Addendum: denial stands.  Will need CSW consult for lower level of care.  Will let family know.   Shann Medal, PT, DPT Admissions Coordinator [phone] 11/12/19  11:41 AM

## 2019-11-12 NOTE — Progress Notes (Signed)
PT Cancellation Note  Patient Details Name: Diamond [name] MRN: [id number] DOB: 09/17/1951   Cancelled Treatment:    Reason Eval/Treat Not Completed: 2nd attempt to work with pt on today. Pt declines participation due to daughter on her way to visit and bring food. Will check back another day.   Weston Anna, PT Acute Rehabilitation Services Pager: [phone] Office: [phone]

## 2019-11-12 NOTE — Discharge Summary (Addendum)
Physician Discharge Summary  Diamond [name] OEV:[id number] DOB: Aug 31, 1951 DOA: 11/05/2019  PCP: [name], MD  Admit date: 11/05/2019 Discharge date: 11/12/2019  Admitted From: home Disposition:  home  Recommendations for Outpatient Follow-up:  1. Follow up with PCP in 1-2 weeks 2. Please obtain BMP/CBC in one week 3. Please follow up on the following pending results:  Home Health: yes pt/ot*  Equipment/Devices:none  Discharge Condition: Stable CODE STATUS: FULL Diet recommendation: Heart Healthy  Brief/Interim Summary:  68 year old female with history of recent Covid infection 11/take/2020, essential hypertension, asthma, CKD stage III, gout presented with generalized weakness, left leg and foot swelling, not being able to ambulate due to swelling in the left leg. Patient already received a last admission were found to have, followed by nephrology, creatinine trended up to 3.8 baseline 2.2 felt to be COVID-19 infection had taken remdesivir steroids, hospital course complicated by A. fib that converted to NSR after verapamil and atenolol which is her home medication patient was on heparin then transition to Eliquis and renal function improved at the time of discharge. Patient notes history of gout treated with colchicine Patient was admitted, treated for AKI on CKD seen by nephrology at this time nephrology signed off creatinine improved to 3.6 BUN 98. She will need to follow-up with nephrology as outpatient. She had anemia needing blood transfusion likely multifactorial no acute bleeding noted.  Hemoccult was negative.  She is placed on mitten B12 supplementation and will follow up with nephrology and PCP for renal function monitoring and CBC check. She has had left foot swelling and pain she will continue on tramadol as needed, prednisone for gout.  Unable to reach colchicine due to her CKD. Initially plan for CIR insurance declined suggested skilled nursing facility which  family declined at this time looking into home health services if available if not then she will go with outpatient physical therapy.  Discharge Diagnoses:  Active Problems:   Hypertension   Gout   AKI (acute kidney injury) ([place])   Anemia of chronic disease   Pneumonia due to COVID-19 virus   Cardiomegaly   Hyperkalemia   CKD (chronic kidney disease), stage III  AKI on CKD stage IIIb: Suspecting more subacute/chronic than acute as per nephrology. On recent imaging negative for acute structural problems.  Patient had recent diarrhea could have caused hypovolemia and prerenal presentation. No indication for acute dialysis at this time.  Seen by nephrology, placed on bicarb, and signed off- advised outpatient follow-up.  Creatinine is downtrending at  3.9, BUN pending.> BUN up with steroid use/CKD, no further drop in hemoglobin to suggest bleeding. Recent Labs  Lab 11/08/19 0408 11/09/19 0404 11/10/19 0320 11/11/19 0505 11/12/19 0344  BUN 93* 91* 101* 101* 98*  CREATININE 4.51* 4.22* 4.01* 8.29* 9.37*   Metabolic acidosis, current bicarb at 14->15->17->22.improved. Cont on po bicarb .  Acute on chronic gout: Monitor stable, continue Tylenol.  And is back on prednisone continue for 5 days.  Follow-up with PCP.  Unable to use colchicine due to CKD.    AF, persistent:rate controlled.  Back in A. Fib. Cont Eliquis-renal dosed to 2.5 mg given her anemia, continue her home atenolol, and verapamil.  Patient had RVR on previous admission at [facility] that converted to normal TSH stable.  Echo with EF 60 to 65%. CHADS2VASC score is 3.    Hypertension: Blood pressure at times up. cont home verapamil/resume atenolol.  Follow-up with PCP to consider upping the dose or starting new medication if needed  Body mass index is 29.32 kg/m.   DVT prophylaxis: Eliquis  Code Status: full Family Communication: plan of care discussed with patient at bedside. Disposition Plan: home  Consultants:  nephro Procedures: 2D echocardiogram 12/1 1. Left ventricular ejection fraction, by visual estimation, is 60 to 65%. The left ventricle has normal function. There is no left ventricular hypertrophy. 2. Left ventricular diastolic parameters are indeterminate. 3. Global right ventricle has normal systolic function.The right ventricular size is normal. No increase in right ventricular wall thickness. 4. Left atrial size was mildly dilated. 5. Right atrial size was normal. 6. Mild mitral annular calcification. 7. Moderate thickening of the mitral valve leaflet(s). 8. The mitral valve is normal in structure. Mild mitral valve regurgitation. No evidence of mitral stenosis. 9. The tricuspid valve is normal in structure. Tricuspid valve regurgitation moderate-severe. 10. The aortic valve is tricuspid. Aortic valve regurgitation is not visualized. Sclerosis. 11. There is Moderate calcification of the aortic valve. 12. There is Moderate thickening of the aortic valve. 13. The pulmonic valve was grossly normal. Pulmonic valve regurgitation is mild. 14. Aortic dilatation noted. 15. There is mild dilatation of the aortic root measuring 39 mm. 16. Normal pulmonary artery systolic pressure. 17. The inferior vena cava is normal in size with greater than 50% respiratory variability, suggesting right atrial pressure of 3 mmHg.   Discharge Instructions  Discharge Instructions    Diet - low sodium heart healthy   Complete by: As directed    Discharge instructions   Complete by: As directed    Please call call MD or return to ER for similar or worsening recurring problem that brought you to hospital or if any fever,nausea/vomiting,abdominal pain, uncontrolled pain, chest pain,  shortness of breath or any other alarming symptoms.  Please follow-up your doctor as instructed in a week time and check your CBC and chemistry panel for your hemoglobin and kidney function.    Please avoid alcohol,  smoking, or any other illicit substance and maintain healthy habits including taking your regular medications as prescribed.  You were cared for by a hospitalist during your hospital stay. If you have any questions about your discharge medications or the care you received while you were in the hospital after you are discharged, you can call the unit and ask to speak with the hospitalist on call if the hospitalist that took care of you is not available.  Once you are discharged, your primary care physician will handle any further medical issues. Please note that NO REFILLS for any discharge medications will be authorized once you are discharged, as it is imperative that you return to your primary care physician (or establish a relationship with a primary care physician if you do not have one) for your aftercare needs so that they can reassess your need for medications and monitor your lab values   Increase activity slowly   Complete by: As directed      Allergies as of 11/12/2019   No Known Allergies     Medication List    TAKE these medications   Advair Diskus 250-50 MCG/DOSE Aepb Generic drug: Fluticasone-Salmeterol Inhale 1 puff into the lungs 2 (two) times daily.   albuterol 108 (90 Base) MCG/ACT inhaler Commonly known as: VENTOLIN HFA Inhale 2 puffs into the lungs every 4 (four) hours as needed for wheezing or shortness of breath.   apixaban 2.5 MG Tabs tablet Commonly known as: ELIQUIS Take 1 tablet (2.5 mg total) by mouth 2 (two) times daily. What changed:  medication strength  how much to take   atenolol 25 MG tablet Commonly known as: TENORMIN Take 25 mg by mouth daily.   oxybutynin 5 MG tablet Commonly known as: DITROPAN Take 1 tablet (5 mg total) by mouth 2 (two) times daily as needed for up to 5 days for bladder spasms.   predniSONE 10 MG tablet Commonly known as: DELTASONE Take 1 tablet (10 mg total) by mouth daily with breakfast for 5 days. Start taking on:  November 13, 2019   sodium bicarbonate 650 MG tablet Take 2 tablets (1,300 mg total) by mouth 2 (two) times daily.   traMADol 50 MG tablet Commonly known as: ULTRAM Take 1 tablet (50 mg total) by mouth every 12 (twelve) hours as needed for up to 10 doses for moderate pain.   verapamil 120 MG tablet Commonly known as: CALAN Take 120 mg by mouth 3 (three) times daily.   vitamin B-12 500 MCG tablet Commonly known as: CYANOCOBALAMIN Take 1 tablet (500 mcg total) by mouth daily. Start taking on: November 13, 2019      Follow-up Information    Osei-Bonsu, Iona Beard, MD Follow up in 1 week(s).   Specialty: Internal Medicine Contact information: [address] [place] [address] [phone]        Rexene Agent, MD Follow up in 1 week(s).   Specialty: Nephrology Why: for kidney function monitoring. call Contact information: [place] [phone]          No Known Allergies  Consultations:  nephro  Procedures/Studies: Dg Ankle Complete Left  Result Date: 11/05/2019 CLINICAL DATA:  Pain EXAM: LEFT ANKLE COMPLETE - 3+ VIEW COMPARISON:  None. FINDINGS: There is nonspecific soft tissue swelling about the ankle without evidence for an acute displaced fracture or dislocation. The mortise joint is preserved. A moderate-sized plantar calcaneal spur is noted. Calcifications are noted within the plantar fascia. IMPRESSION: 1. Soft tissue swelling without evidence for acute bony abnormality. 2. Plantar calcaneal spur. 3. Calcifications within the plantar fascia. Electronically Signed   By: Constance Holster M.D.   On: 11/05/2019 17:43   US Renal  Result Date: 10/18/2019 CLINICAL DATA:  Acute renal disease. EXAM: RENAL / URINARY TRACT ULTRASOUND COMPLETE COMPARISON:  None. FINDINGS: Right Kidney: Renal measurements: 9.6 x 3.9 x 4.8 cm = volume: 92 mL. Contains a 10 mm cyst. Increased cortical echogenicity. Left Kidney: Renal  measurements: 10.6 x 5.1 x 5.4 cm = volume: 152.4 mL. Contains a 1.7 cm cyst. Increased cortical echogenicity. Bladder: Not well evaluated due to lack of distention. Other: None. IMPRESSION: 1. Increased cortical echogenicity is seen in both kidneys consistent with medical renal disease. Bilateral renal cysts are noted. Electronically Signed   By: Dorise Bullion III M.D   On: 10/18/2019 16:25   Dg Chest Port 1 View  Result Date: 11/05/2019 CLINICAL DATA:  Leg swelling EXAM: PORTABLE CHEST 1 VIEW COMPARISON:  None. FINDINGS: There arm proving bibasilar and peripheral airspace opacities. There is no pneumothorax. No large pleural effusion. The heart size remains enlarged. There is some vascular congestion without overt pulmonary edema. There is no acute osseous abnormality. IMPRESSION: 1. Improving bibasilar airspace opacities. Continued follow-up to resolution is recommended. 2. Cardiomegaly with vascular congestion. No overt pulmonary edema or large pleural effusion. Electronically Signed   By: Constance Holster M.D.   On: 11/05/2019 20:19   Dg Chest Port 1 View  Result Date: 10/16/2019 CLINICAL DATA:  Fever EXAM: PORTABLE CHEST 1  VIEW COMPARISON:  Portable exam 1654 hours compared to 10/15/2010 FINDINGS: Borderline enlargement of cardiac silhouette. Mediastinal contours and pulmonary vascularity normal. Peripheral infiltrates in both lungs consistent with pneumonia, including atypical etiologies. Bibasilar atelectasis. No pleural effusion or pneumothorax. Bones unremarkable. IMPRESSION: Bibasilar atelectasis with peripheral infiltrates in both lungs consistent with pneumonia, including atypical etiologies including viral pneumonia. Electronically Signed   By: Lavonia Dana M.D.   On: 10/16/2019 17:00   Dg Foot Complete Left  Result Date: 11/05/2019 CLINICAL DATA:  Foot pain. EXAM: LEFT FOOT - COMPLETE 3+ VIEW COMPARISON:  None. FINDINGS: Degenerative changes and erosions are noted at the first  metatarsophalangeal joint. There is surrounding soft tissue swelling. There is no acute displaced fracture. No dislocation. Degenerative changes are noted of the midfoot. There is a moderate-sized plantar calcaneal spur. Calcifications are noted within the plantar fascia. There is no acute displaced fracture or dislocation. IMPRESSION: 1. Findings compatible with gout at the first metatarsophalangeal joint. 2. No acute displaced fracture or dislocation. 3. Degenerative changes of the midfoot. 4. Moderate-sized plantar calcaneal spur. 5. Calcifications within the plantar fascia may be secondary to underlying plantar fasciitis in the appropriate clinical setting. Clinical correlation is recommended. Electronically Signed   By: Constance Holster M.D.   On: 11/05/2019 17:42   Dg Foot Complete Right  Result Date: 11/05/2019 CLINICAL DATA:  Bilateral foot pain. EXAM: RIGHT FOOT COMPLETE - 3+ VIEW COMPARISON:  09/06/2015 FINDINGS: There are advanced degenerative changes of metatarsophalangeal joint of the first digit. There is surrounding soft tissue swelling. There may be some mild associated erosions. These findings have progressed since 2016. There is a moderate-sized plantar calcaneal spur. Calcifications are noted in the region of the plantar fascia at the level of the hindfoot. Midfoot degenerative changes are noted. There is no acute displaced fracture or dislocation. IMPRESSION: 1. Advanced degenerative changes of the metatarsophalangeal joint of the first digit with surrounding soft tissue swelling. There may be some mild associated erosions. These findings have progressed since 2016 inter suspicious for gout. 2. Calcifications within the plantar fascia can be seen in patients with plantar fasciitis. 3. No acute displaced fracture or dislocation. Electronically Signed   By: Constance Holster M.D.   On: 11/05/2019 17:41   Vas [place] Lower Extremity Venous (dvt) (mc And Wl 7a-7p)  Result Date: 11/06/2019   Lower Venous Study Indications: Swelling.  Risk Factors: None identified. Limitations: Poor ultrasound/tissue interface and body habitus. Comparison Study: No prior stuides. Performing Technologist: Oliver Hum RVT  Examination Guidelines: A complete evaluation includes B-mode imaging, spectral Doppler, color Doppler, and power Doppler as needed of all accessible portions of each vessel. Bilateral testing is considered an integral part of a complete examination. Limited examinations for reoccurring indications may be performed as noted.  +-----+---------------+---------+-----------+----------+--------------+ RIGHTCompressibilityPhasicitySpontaneityPropertiesThrombus Aging +-----+---------------+---------+-----------+----------+--------------+ CFV  Full           Yes      Yes                                 +-----+---------------+---------+-----------+----------+--------------+   +---------+---------------+---------+-----------+----------+--------------+ LEFT     CompressibilityPhasicitySpontaneityPropertiesThrombus Aging +---------+---------------+---------+-----------+----------+--------------+ CFV      Full           Yes      Yes                                 +---------+---------------+---------+-----------+----------+--------------+  SFJ      Full                                                        +---------+---------------+---------+-----------+----------+--------------+ FV Prox  Full                                                        +---------+---------------+---------+-----------+----------+--------------+ FV Mid   Full                                                        +---------+---------------+---------+-----------+----------+--------------+ FV DistalFull                                                        +---------+---------------+---------+-----------+----------+--------------+ PFV      Full                                                         +---------+---------------+---------+-----------+----------+--------------+ POP      Full           Yes      Yes                                 +---------+---------------+---------+-----------+----------+--------------+ PTV      Full                                                        +---------+---------------+---------+-----------+----------+--------------+ PERO     Full                                                        +---------+---------------+---------+-----------+----------+--------------+     Summary: Right: No evidence of common femoral vein obstruction. Left: There is no evidence of deep vein thrombosis in the lower extremity. No cystic structure found in the popliteal fossa.  *See table(s) above for measurements and observations. Electronically signed by Deitra Mayo MD on 11/06/2019 at 9:24:59 AM.    Final    Subjective: Resting well, some left foot pain no new complaints.  Discharge Exam: Vitals:   11/12/19 1000 11/12/19 1249  BP: (!) 175/98 132/79  Pulse:  68  Resp:  19  Temp:  98.1 F (36.7 C)  SpO2:  99%   Vitals:   11/11/19 2103 11/12/19  1610 11/12/19 1000 11/12/19 1249  BP: (!) 164/108 (!) 161/95 (!) 175/98 132/79  Pulse: 80 77  68  Resp:  18  19  Temp: 98.9 F (37.2 C) 98.1 F (36.7 C)  98.1 F (36.7 C)  TempSrc: Oral Oral  Oral  SpO2: 100% 100%  99%  Weight:      Height:        General:Pt is alert, awake, not in acute distress Cardiovascular: RRR, S1/S2 +, no rubs, no gallops Respiratory:CTA bilaterally, no wheezing, no rhonchi Abdominal:Soft, NT, ND, bowel sounds + Extremities:No cyanosis,, left  foot swelling.  The results of significant diagnostics from this hospitalization (including imaging, microbiology, ancillary and laboratory) are listed below for reference.     Microbiology: Recent Results (from the past 240 hour(s))  Culture, blood (routine x 2)     Status: None   Collection Time:  11/05/19  4:13 PM   Specimen: BLOOD RIGHT HAND  Result Value Ref Range Status   Specimen Description   Final    BLOOD RIGHT HAND Performed at [facility], [place] [address], [place], [place] [address]    Special Requests   Final    BOTTLES DRAWN AEROBIC ONLY Blood Culture adequate volume Performed at [place] [address], [place], [place] [address]    Culture   Final    NO GROWTH 5 DAYS Performed at [facility], [place] [address] [place], [place] [address]    Report Status 11/10/2019 FINAL  Final  SARS CORONAVIRUS 2 (TAT 6-24 HRS) Nasopharyngeal Nasopharyngeal Swab     Status: None   Collection Time: 11/05/19  9:52 PM   Specimen: Nasopharyngeal Swab  Result Value Ref Range Status   SARS Coronavirus 2 NEGATIVE NEGATIVE Final    Comment: (NOTE) SARS-CoV-2 target nucleic acids are NOT DETECTED. The SARS-CoV-2 RNA is generally detectable in upper and lower respiratory specimens during the acute phase of infection. Negative results do not preclude SARS-CoV-2 infection, do not rule out co-infections with other pathogens, and should not be used as the sole basis for treatment or other patient management decisions. Negative results must be combined with clinical observations, patient history, and epidemiological information. The expected result is Negative. Fact Sheet for Patients: SugarRoll.be Fact Sheet for Healthcare Providers: https://www.woods-mathews.com/ This test is not yet approved or cleared by the [place] FDA and  has been authorized for detection and/or diagnosis of SARS-CoV-2 by FDA under an Emergency Use Authorization (EUA). This EUA will remain  in effect (meaning this test can be used) for the duration of the COVID-19 declaration under Section 56 4(b)(1) of the Act, 21 U.S.C. section 360bbb-3(b)(1), unless the authorization is terminated or revoked sooner. Performed at  [facility], [place] [address], [place], [place] [address]   Culture, blood (routine x 2)     Status: None   Collection Time: 11/05/19 11:57 PM   Specimen: BLOOD  Result Value Ref Range Status   Specimen Description   Final    BLOOD LEFT ANTECUBITAL Performed at [place] [address], [place], [place] [address]    Special Requests   Final    BOTTLES DRAWN AEROBIC ONLY Blood Culture adequate volume Performed at [place] [address], [place], [place] [address]    Culture   Final    NO GROWTH 5 DAYS Performed at [facility], [place] [address], [place],  [address]    Report Status 11/11/2019  FINAL  Final  SARS CORONAVIRUS 2 (TAT 6-24 HRS) Nasopharyngeal Nasopharyngeal Swab     Status: None   Collection Time: 11/11/19  7:06 PM   Specimen: Nasopharyngeal Swab  Result Value Ref Range Status   SARS Coronavirus 2 NEGATIVE NEGATIVE Final    Comment: (NOTE) SARS-CoV-2 target nucleic acids are NOT DETECTED. The SARS-CoV-2 RNA is generally detectable in upper and lower respiratory specimens during the acute phase of infection. Negative results do not preclude SARS-CoV-2 infection, do not rule out co-infections with other pathogens, and should not be used as the sole basis for treatment or other patient management decisions. Negative results must be combined with clinical observations, patient history, and epidemiological information. The expected result is Negative. Fact Sheet for Patients: SugarRoll.be Fact Sheet for Healthcare Providers: https://www.woods-mathews.com/ This test is not yet approved or cleared by the [place] FDA and  has been authorized for detection and/or diagnosis of SARS-CoV-2 by FDA under an Emergency Use Authorization (EUA). This EUA will remain  in effect (meaning this test can be used) for the duration of the COVID-19 declaration under Section  56 4(b)(1) of the Act, 21 U.S.C. section 360bbb-3(b)(1), unless the authorization is terminated or revoked sooner. Performed at [facility], [place] [address], [place], [place] [address]      Labs: BNP (last 3 results) Recent Labs    10/16/19 1730  BNP 08.1   Basic Metabolic Panel: Recent Labs  Lab 11/05/19 2357  11/08/19 0408 11/09/19 0404 11/10/19 0320 11/11/19 0505 11/12/19 0344  NA 139   < > 137 136 138 138 140  K 4.2   < > 4.7 5.0 4.7 4.7 4.4  CL 110   < > 108 106 108 105 108  CO2 17*   < > 14* 15* 17* 18* 22  GLUCOSE 137*   < > 111* 115* 109* 96 86  BUN 66*   < > 93* 91* 101* 101* 98*  CREATININE 5.03*   < > 4.51* 4.22* 4.01* 3.96* 3.62*  CALCIUM 9.0   < > 9.4 9.0 9.2 9.2 9.1  MG 2.0  --   --   --   --   --   --   PHOS 6.7*  --   --   --   --   --   --    < > = values in this interval not displayed.   Liver Function Tests: Recent Labs  Lab 11/05/19 2357  AST 19  ALT 25  ALKPHOS 59  BILITOT 0.4  PROT 7.0  ALBUMIN 2.2*   No results for input(s): LIPASE, AMYLASE in the last 168 hours. No results for input(s): AMMONIA in the last 168 hours. CBC: Recent Labs  Lab 11/08/19 0408 11/08/19 2115 11/09/19 0404 11/10/19 0320 11/11/19 0505 11/12/19 0344  WBC 7.7  --  6.3 6.0 6.7 6.2  HGB 6.9* 8.8* 8.2* 8.2* 8.6* 8.9*  HCT 22.0* 27.8* 25.3* 25.9* 27.1* 28.7*  MCV 103.3*  --  100.4* 100.4* 100.0 102.1*  PLT 233  --  271 301 374 364   Cardiac Enzymes: No results for input(s): CKTOTAL, CKMB, CKMBINDEX, TROPONINI in the last 168 hours. BNP: Invalid input(s): POCBNP CBG: No results for input(s): GLUCAP in the last 168 hours. D-Dimer No results for input(s): DDIMER in the last 72 hours. Hgb A1c No results for input(s): HGBA1C in the last 72 hours. Lipid Profile No results for input(s): CHOL, HDL, LDLCALC, TRIG, CHOLHDL, LDLDIRECT in the last 72 hours. Thyroid  function studies No results for input(s): TSH, T4TOTAL, T3FREE, THYROIDAB in the last 72  hours.  Invalid input(s): FREET3 Anemia work up No results for input(s): VITAMINB12, FOLATE, FERRITIN, TIBC, IRON, RETICCTPCT in the last 72 hours. Urinalysis    Component Value Date/Time   COLORURINE YELLOW 11/05/2019 [place] 11/05/2019 1633   LABSPEC 1.010 11/05/2019 1633   PHURINE 5.0 11/05/2019 1633   GLUCOSEU NEGATIVE 11/05/2019 1633   HGBUR MODERATE (A) 11/05/2019 1633   BILIRUBINUR NEGATIVE 11/05/2019 1633   [place] 11/05/2019 1633   PROTEINUR 30 (A) 11/05/2019 1633   NITRITE NEGATIVE 11/05/2019 1633   LEUKOCYTESUR NEGATIVE 11/05/2019 1633   Sepsis Labs Invalid input(s): PROCALCITONIN,  WBC,  LACTICIDVEN Microbiology Recent Results (from the past 240 hour(s))  Culture, blood (routine x 2)     Status: None   Collection Time: 11/05/19  4:13 PM   Specimen: BLOOD RIGHT HAND  Result Value Ref Range Status   Specimen Description   Final    BLOOD RIGHT HAND Performed at [facility], [place] [address] [place], [place] [address]    Special Requests   Final    BOTTLES DRAWN AEROBIC ONLY Blood Culture adequate volume Performed at [place] [address] [place], [place] [address]    Culture   Final    NO GROWTH 5 DAYS Performed at [facility], [place] [address] [place], [place] [address]    Report Status 11/10/2019 FINAL  Final  SARS CORONAVIRUS 2 (TAT 6-24 HRS) Nasopharyngeal Nasopharyngeal Swab     Status: None   Collection Time: 11/05/19  9:52 PM   Specimen: Nasopharyngeal Swab  Result Value Ref Range Status   SARS Coronavirus 2 NEGATIVE NEGATIVE Final    Comment: (NOTE) SARS-CoV-2 target nucleic acids are NOT DETECTED. The SARS-CoV-2 RNA is generally detectable in upper and lower respiratory specimens during the acute phase of infection. Negative results do not preclude SARS-CoV-2 infection, do not rule out co-infections with other pathogens, and should not be used as the sole basis for  treatment or other patient management decisions. Negative results must be combined with clinical observations, patient history, and epidemiological information. The expected result is Negative. Fact Sheet for Patients: SugarRoll.be Fact Sheet for Healthcare Providers: https://www.woods-mathews.com/ This test is not yet approved or cleared by the [place] FDA and  has been authorized for detection and/or diagnosis of SARS-CoV-2 by FDA under an Emergency Use Authorization (EUA). This EUA will remain  in effect (meaning this test can be used) for the duration of the COVID-19 declaration under Section 56 4(b)(1) of the Act, 21 U.S.C. section 360bbb-3(b)(1), unless the authorization is terminated or revoked sooner. Performed at [facility], [place] [address], [place], [place] [address]   Culture, blood (routine x 2)     Status: None   Collection Time: 11/05/19 11:57 PM   Specimen: BLOOD  Result Value Ref Range Status   Specimen Description   Final    BLOOD LEFT ANTECUBITAL Performed at [facility] [address] [place], [place] [address]    Special Requests   Final    BOTTLES DRAWN AEROBIC ONLY Blood Culture adequate volume Performed at [facility] [address], [place], [place] [address]    Culture   Final    NO GROWTH 5 DAYS Performed at [facility], [place] [address], [place],  [address]    Report Status 11/11/2019 FINAL  Final  SARS CORONAVIRUS 2 (TAT 6-24 HRS) Nasopharyngeal Nasopharyngeal Swab     Status: None   Collection Time: 11/11/19  7:06 PM   Specimen: Nasopharyngeal Swab  Result Value Ref Range Status   SARS Coronavirus 2 NEGATIVE NEGATIVE Final    Comment: (NOTE) SARS-CoV-2 target nucleic acids are NOT DETECTED. The SARS-CoV-2 RNA is generally detectable in upper and lower respiratory specimens during the acute phase of infection. Negative results do not  preclude SARS-CoV-2 infection, do not rule out co-infections with other pathogens, and should not be used as the sole basis for treatment or other patient management decisions. Negative results must be combined with clinical observations, patient history, and epidemiological information. The expected result is Negative. Fact Sheet for Patients: SugarRoll.be Fact Sheet for Healthcare Providers: https://www.woods-mathews.com/ This test is not yet approved or cleared by the [place] FDA and  has been authorized for detection and/or diagnosis of SARS-CoV-2 by FDA under an Emergency Use Authorization (EUA). This EUA will remain  in effect (meaning this test can be used) for the duration of the COVID-19 declaration under Section 56 4(b)(1) of the Act, 21 U.S.C. section 360bbb-3(b)(1), unless the authorization is terminated or revoked sooner. Performed at [facility], [place] [address] [place], [place] [address]      Time coordinating discharge: 35 minutes  SIGNED:   Antonieta Pert, MD  [facility] 11/12/2019, 4:46 PM  If 7PM-7AM, please contact night-coverage [email or web address]

## 2019-12-19 ENCOUNTER — Other Ambulatory Visit (HOSPITAL_COMMUNITY): Payer: Self-pay | Admitting: *Deleted

## 2019-12-20 ENCOUNTER — Encounter (HOSPITAL_COMMUNITY)
Admission: RE | Admit: 2019-12-20 | Discharge: 2019-12-20 | Disposition: A | Discharge status: Home / Self Care | Payer: Medicare HMO | Source: Ambulatory Visit | Attending: Nephrology | Admitting: Nephrology

## 2019-12-20 ENCOUNTER — Other Ambulatory Visit: Payer: Self-pay

## 2019-12-20 DIAGNOSIS — D631 Anemia in chronic kidney disease: Secondary | ICD-10-CM | POA: Insufficient documentation

## 2019-12-20 DIAGNOSIS — N189 Chronic kidney disease, unspecified: Secondary | ICD-10-CM | POA: Diagnosis present

## 2019-12-20 MED ORDER — SODIUM CHLORIDE 0.9 % IV SOLN
510.0000 mg | INTRAVENOUS | Status: DC
  Administered 2019-12-20: 510 mg via INTRAVENOUS
  Filled 2019-12-20: qty 510

## 2019-12-27 ENCOUNTER — Other Ambulatory Visit: Payer: Self-pay

## 2019-12-27 ENCOUNTER — Ambulatory Visit (HOSPITAL_COMMUNITY)
Admission: RE | Admit: 2019-12-27 | Discharge: 2019-12-27 | Disposition: A | Discharge status: Home / Self Care | Payer: Medicare HMO | Source: Ambulatory Visit | Attending: Nephrology | Admitting: Nephrology

## 2019-12-27 DIAGNOSIS — D631 Anemia in chronic kidney disease: Secondary | ICD-10-CM | POA: Diagnosis not present

## 2019-12-27 DIAGNOSIS — N189 Chronic kidney disease, unspecified: Secondary | ICD-10-CM | POA: Insufficient documentation

## 2019-12-27 MED ORDER — SODIUM CHLORIDE 0.9 % IV SOLN
510.0000 mg | INTRAVENOUS | Status: DC
  Administered 2019-12-27: 510 mg via INTRAVENOUS
  Filled 2019-12-27: qty 17

## 2020-05-10 ENCOUNTER — Encounter (HOSPITAL_COMMUNITY): Payer: Self-pay

## 2020-05-10 ENCOUNTER — Ambulatory Visit (HOSPITAL_COMMUNITY)
Admission: EM | Admit: 2020-05-10 | Discharge: 2020-05-10 | Disposition: A | Discharge status: Home / Self Care | Payer: Medicare HMO

## 2020-05-10 ENCOUNTER — Other Ambulatory Visit: Payer: Self-pay

## 2020-05-10 DIAGNOSIS — M546 Pain in thoracic spine: Secondary | ICD-10-CM | POA: Diagnosis not present

## 2020-05-10 DIAGNOSIS — N185 Chronic kidney disease, stage 5: Secondary | ICD-10-CM

## 2020-05-10 MED ORDER — CYCLOBENZAPRINE HCL 5 MG PO TABS: 5.0000 mg | ORAL_TABLET | Freq: Two times a day (BID) | ORAL | 0 refills | Status: DC | PRN

## 2020-05-10 MED ORDER — PREDNISONE 20 MG PO TABS: 40.0000 mg | ORAL_TABLET | Freq: Every day | ORAL | 0 refills | Status: AC

## 2020-05-10 MED ORDER — TRAMADOL HCL 50 MG PO TABS: 50.0000 mg | ORAL_TABLET | Freq: Two times a day (BID) | ORAL | 0 refills | Status: DC | PRN

## 2020-05-10 NOTE — Discharge Instructions (Addendum)
If no improvement in back pain with prescribed therapy after 3 days, follow-up immediately with primary care provider.

## 2020-05-10 NOTE — ED Triage Notes (Addendum)
Pt reports on and off lower back pain since this morning. Pain is worse when walking or moving to the side when sitting.

## 2020-05-10 NOTE — ED Provider Notes (Signed)
[place]    CSN: [id number] Arrival date & time: 05/10/20  1249      History   Chief Complaint Chief Complaint  Patient presents with  . Back Pain    HPI Diamond [name] is a 69 y.o. female.   HPI   Back Pain: Patient presents for presents evaluation of low back problems.  Symptoms have been present for evaluation of bilateral thoracic back pain.  Symptoms acutely developed this morning around 5 AM.  She denies any known injury.  Denies a history of chronic back pain.  She has not taken any medications for her symptoms as she has chronic kidney disease stage 5. She is not currently on dialysis.  She characterizes pain as spasmatic and is occurring on both sides.  She denies any other affected extremities and pain is not radiating to her abdomen or chest. Pain is persistent, however, waxes and wanes regarding intensity Past Medical History:  Diagnosis Date  . Asthma   . Gout   . Hypertension     Patient Active Problem List   Diagnosis Date Noted  . CKD (chronic kidney disease), stage III 11/10/2019  . Cardiomegaly 11/05/2019  . Hyperkalemia 11/05/2019  . Hypokalemia 10/18/2019  . Pneumonia due to COVID-19 virus 10/17/2019  . CAP (community acquired pneumonia) 10/16/2019  . Hypertension   . Asthma   . Gout   . AKI (acute kidney injury) ([place])   . Hyponatremia   . Anemia of chronic disease     Past Surgical History:  Procedure Laterality Date  . CESAREAN SECTION  1989    OB History   No obstetric history on file.      Home Medications    Prior to Admission medications   Medication Sig Start Date End Date Taking? Authorizing Provider  budesonide-formoterol (SYMBICORT) 160-4.5 MCG/ACT inhaler Inhale 2 puffs into the lungs 2 (two) times daily.   Yes [provider]  montelukast (SINGULAIR) 10 MG tablet Take 10 mg by mouth at bedtime.   Yes [provider]  albuterol (VENTOLIN HFA) 108 (90 Base) MCG/ACT inhaler Inhale 2 puffs into  the lungs every 4 (four) hours as needed for wheezing or shortness of breath. 10/08/19   [provider]  apixaban (ELIQUIS) 2.5 MG TABS tablet Take 1 tablet (2.5 mg total) by mouth 2 (two) times daily. 11/12/19 12/12/19  Antonieta Pert, MD  atenolol (TENORMIN) 25 MG tablet Take 25 mg by mouth daily.    [provider]  Fluticasone-Salmeterol (ADVAIR DISKUS) 250-50 MCG/DOSE AEPB Inhale 1 puff into the lungs 2 (two) times daily.     [provider]  traMADol (ULTRAM) 50 MG tablet Take 1 tablet (50 mg total) by mouth every 12 (twelve) hours as needed for up to 10 doses for moderate pain. 11/12/19   Antonieta Pert, MD  verapamil (CALAN) 120 MG tablet Take 120 mg by mouth 3 (three) times daily.    [provider]    Family History Family History  Problem Relation Age of Onset  . Hypertension Mother   . Hypertension Father   . Colon cancer Brother   . Lung cancer Brother     Social History Social History   Tobacco Use  . Smoking status: Never Smoker  . Smokeless tobacco: Never Used  Substance Use Topics  . Alcohol use: No  . Drug use: No     Allergies   Patient has no known allergies.   Review of Systems Review of Systems Pertinent  negatives listed in HPI Physical Exam Triage Vital Signs ED Triage Vitals  Enc Vitals Group     BP 05/10/20 1335 129/89     Pulse Rate 05/10/20 1335 (!) 55     Resp 05/10/20 1335 19     Temp 05/10/20 1335 98.5 F (36.9 C)     Temp Source 05/10/20 1335 Oral     SpO2 05/10/20 1335 100 %     Weight --      Height --      Head Circumference --      Peak Flow --      Pain Score 05/10/20 1333 8     Pain Loc --      Pain Edu? --      Excl. in [place]? --    No data found.  Updated Vital Signs BP 129/89 (BP Location: Right Arm)   Pulse (!) 55   Temp 98.5 F (36.9 C) (Oral)   Resp 19   SpO2 100%   Visual Acuity Right Eye Distance:   Left Eye Distance:   Bilateral Distance:    Right Eye Near:   Left Eye Near:      Bilateral Near:     Physical Exam General appearance: alert, well developed, well nourished, cooperative and no distress Head: Normocephalic, without obvious abnormality, atraumatic Respiratory: Respirations even and unlabored, normal respiratory rate Heart: Rate and rhythm normal. No gallop or murmurs noted on exam  Abdomen: BS +, no distention, no rebound tenderness, or no mass Musculoskeletal: Pain with rotational movements, limited ROM. Normal neck and extremities. Skin: Skin color, texture, turgor normal. No rashes seen  Psych: Appropriate mood and affect. Neurologic: GCS 15, gait intact. UC Treatments / Results  Labs (all labs ordered are listed, but only abnormal results are displayed) Labs Reviewed - No data to display  EKG   Radiology No results found.  Procedures Procedures (including critical care time)  Medications Ordered in UC Medications - No data to display  Initial Impression / Assessment and Plan / UC Course  I have reviewed the triage vital signs and the nursing notes.  Pertinent labs & imaging results that were available during my care of the patient were reviewed by me and considered in my medical decision making (see chart for details).    Acute back pain, w/ CKD 5. Advised avoidance of NSAIDS. Given level of pain, will prescribe a short course of prednisone. Cyclobenzaprine for muscle spasms, and tramadol (short supply)  for pain. Strict PCP follow-up if symptoms continue without significant improvement over the next 2-3 days. ER if significantly worsens. An After Visit Summary was printed and given to the patient.  Final Clinical Impressions(s) / UC Diagnoses   Final diagnoses:  Acute bilateral thoracic back pain  CKD (chronic kidney disease) stage 5, GFR less than 15 ml/min (HCC)     Discharge Instructions     If no improvement in back pain with prescribed therapy after 3 days, follow-up immediately with primary care provider.    ED  Prescriptions    Medication Sig Dispense Auth. Provider   traMADol (ULTRAM) 50 MG tablet Take 1-2 tablets (50-100 mg total) by mouth every 12 (twelve) hours as needed for up to 10 doses for moderate pain or severe pain. 12 tablet Scot Jun, FNP   predniSONE (DELTASONE) 20 MG tablet Take 2 tablets (40 mg total) by mouth daily with breakfast for 5 days. 10 tablet Scot Jun, FNP   cyclobenzaprine (FLEXERIL) 5 MG  tablet Take 1 tablet (5 mg total) by mouth 2 (two) times daily as needed for muscle spasms. 30 tablet Scot Jun, FNP     I have reviewed the PDMP during this encounter.   Scot Jun, FNP 05/10/20 1425

## 2020-05-20 ENCOUNTER — Other Ambulatory Visit: Payer: Self-pay | Admitting: *Deleted

## 2020-05-20 DIAGNOSIS — N186 End stage renal disease: Secondary | ICD-10-CM

## 2020-05-29 ENCOUNTER — Ambulatory Visit (HOSPITAL_COMMUNITY)
Admission: RE | Admit: 2020-05-29 | Discharge: 2020-05-29 | Disposition: A | Discharge status: Home / Self Care | Payer: Medicare HMO | Source: Ambulatory Visit | Attending: Vascular Surgery | Admitting: Vascular Surgery

## 2020-05-29 ENCOUNTER — Encounter: Payer: Self-pay | Admitting: Vascular Surgery

## 2020-05-29 ENCOUNTER — Ambulatory Visit (INDEPENDENT_AMBULATORY_CARE_PROVIDER_SITE_OTHER): Payer: Medicare HMO | Admitting: Vascular Surgery

## 2020-05-29 ENCOUNTER — Other Ambulatory Visit: Payer: Self-pay

## 2020-05-29 ENCOUNTER — Ambulatory Visit (INDEPENDENT_AMBULATORY_CARE_PROVIDER_SITE_OTHER)
Admission: RE | Admit: 2020-05-29 | Discharge: 2020-05-29 | Disposition: A | Discharge status: Home / Self Care | Payer: Medicare HMO | Source: Ambulatory Visit | Attending: Vascular Surgery | Admitting: Vascular Surgery

## 2020-05-29 VITALS — BP 149/83 | HR 56 | Temp 97.3°F | Resp 20 | Ht 67.0 in | Wt 184.0 lb

## 2020-05-29 DIAGNOSIS — N186 End stage renal disease: Secondary | ICD-10-CM | POA: Diagnosis not present

## 2020-05-29 DIAGNOSIS — N184 Chronic kidney disease, stage 4 (severe): Secondary | ICD-10-CM

## 2020-05-29 NOTE — Progress Notes (Signed)
Referring Physician: Dr. Arty Baumgartner  Patient name: Diamond [name] MRN: [id number] DOB: Jun 14, 1951 Sex: female  REASON FOR CONSULT: Hemodialysis access  HPI: Diamond [name] is a 69 y.o. female, currently CKD 4.  She is right-handed.  She has not had any prior access procedures.  He has no baseline numbness or tingling in her fingers.  She does not have a pacemaker.  Other medical problems include hypertension which has been stable.  She also has asthma.  She is on Eliquis.  This is for atrial fibrillation.  Past Medical History:  Diagnosis Date  . Asthma   . Chronic kidney disease   . Gout   . Hypertension    Past Surgical History:  Procedure Laterality Date  . CESAREAN SECTION  1989    Family History  Problem Relation Age of Onset  . Hypertension Mother   . Hypertension Father   . Colon cancer Brother   . Lung cancer Brother     SOCIAL HISTORY: Social History   Socioeconomic History  . Marital status: Widowed    Spouse name: Not on file  . Number of children: Not on file  . Years of education: Not on file  . Highest education level: Not on file  Occupational History  . Not on file  Tobacco Use  . Smoking status: Never Smoker  . Smokeless tobacco: Never Used  Vaping Use  . Vaping Use: Never used  Substance and Sexual Activity  . Alcohol use: No  . Drug use: No  . Sexual activity: Not on file  Other Topics Concern  . Not on file  Social History Narrative  . Not on file   Social Determinants of Health   Financial Resource Strain:   . Difficulty of Paying Living Expenses:   Food Insecurity:   . Worried About Charity fundraiser in the Last Year:   . Arboriculturist in the Last Year:   Transportation Needs:   . Film/video editor (Medical):   [name]   Physical Activity:   . Days of Exercise per Week:   . Minutes of Exercise per Session:   Stress:   . Feeling of Stress :   Social Connections:   . Frequency of  Communication with Friends and Family:   . Frequency of Social Gatherings with Friends and Family:   . Attends Religious Services:   . Active Member of Clubs or Organizations:   . Attends Archivist Meetings:   Marland Kitchen Marital Status:   Intimate Partner Violence:   . Fear of Current or Ex-Partner:   . Emotionally Abused:   Marland Kitchen Physically Abused:   . Sexually Abused:     No Known Allergies  Current Outpatient Medications  Medication Sig Dispense Refill  . albuterol (VENTOLIN HFA) 108 (90 Base) MCG/ACT inhaler Inhale 2 puffs into the lungs every 4 (four) hours as needed for wheezing or shortness of breath.    Marland Kitchen atenolol (TENORMIN) 25 MG tablet Take 25 mg by mouth daily.    . budesonide-formoterol (SYMBICORT) 160-4.5 MCG/ACT inhaler Inhale 2 puffs into the lungs 2 (two) times daily.    . cyclobenzaprine (FLEXERIL) 5 MG tablet Take 1 tablet (5 mg total) by mouth 2 (two) times daily as needed for muscle spasms. 30 tablet 0  . Fluticasone-Salmeterol (ADVAIR DISKUS) 250-50 MCG/DOSE AEPB Inhale 1 puff into the lungs 2 (two) times daily.     . montelukast (SINGULAIR) 10 MG tablet Take  10 mg by mouth at bedtime.    . traMADol (ULTRAM) 50 MG tablet Take 1-2 tablets (50-100 mg total) by mouth every 12 (twelve) hours as needed for up to 10 doses for moderate pain or severe pain. 12 tablet 0  . verapamil (CALAN) 120 MG tablet Take 120 mg by mouth 3 (three) times daily.    Marland Kitchen apixaban (ELIQUIS) 2.5 MG TABS tablet Take 1 tablet (2.5 mg total) by mouth 2 (two) times daily. 60 tablet 0   No current facility-administered medications for this visit.    ROS:   General:  No weight loss, Fever, chills  HEENT: No recent headaches, no nasal bleeding, no visual changes, no sore throat  Neurologic: No dizziness, blackouts, seizures. No recent symptoms of stroke or mini- stroke. No recent episodes of slurred speech, or temporary blindness.  Cardiac: No recent episodes of chest pain/pressure, no shortness  of breath at rest.  No shortness of breath with exertion.  + history of atrial fibrillation or irregular heartbeat  Vascular: No history of rest pain in feet.  No history of claudication.  No history of non-healing ulcer, No history of DVT   Pulmonary: No home oxygen, no productive cough, no hemoptysis,  No asthma or wheezing  Musculoskeletal:  [ ]  Arthritis, [ ]  Low back pain,  [ ]  Joint pain  Hematologic:No history of hypercoagulable state.  No history of easy bleeding.  No history of anemia  Gastrointestinal: No hematochezia or melena,  No gastroesophageal reflux, no trouble swallowing  Urinary: [X]  chronic Kidney disease, [ ]  on HD - [ ]  MWF or [ ]  TTHS, [ ]  Burning with urination, [ ]  Frequent urination, [ ]  Difficulty urinating;   Skin: No rashes  Psychological: No history of anxiety,  No history of depression   Physical Examination  Vitals:   05/29/20 1343  BP: (!) 149/83  Pulse: (!) 56  Resp: 20  Temp: (!) 97.3 F (36.3 C)  SpO2: 99%  Weight: 184 lb (83.5 kg)  Height: 5\' 7"  (1.702 m)    Body mass index is 28.82 kg/m.  General:  Alert and oriented, no acute distress HEENT: Normal Neck: No  JVD Cardiac: Regular Rate and Rhythm Skin: No rash Extremity Pulses:  2+ radial, brachial pulses bilaterally Musculoskeletal: No deformity or edema  Neurologic: Upper and lower extremity motor 5/5 and symmetric  DATA:  Patient had upper extremity arterial exam today by duplex.  This showed normal arterial anatomy.  Vein mapping ultrasound was also performed.  She has no vein greater than 2 mm in either arm.  I reviewed and interpreted both the studies.  ASSESSMENT: Patient does not have adequate vein for creation of a fistula.  Currently Dr. Arty Baumgartner is asking for [place] to wait on placing an AV graft.   PLAN: Left upper arm AV graft at some point in the future when patient's kidney function has declined enough that Dr. Arty Baumgartner wishes for [place] to place this.   Diamond Hinds, MD [facility] Office: [phone] Pager: [phone]

## 2020-09-03 ENCOUNTER — Telehealth: Payer: Self-pay | Admitting: Hematology and Oncology

## 2020-09-03 NOTE — Telephone Encounter (Signed)
Received a new hem referral from Dr. Marylen Ponto for low rbc. Ms. Diamond [name] cld and scheduled to an appt w/Dr. Lindi Adie on 10/19 at 9:15am. Pt aware to arrive 30 minutes early.

## 2020-09-23 ENCOUNTER — Inpatient Hospital Stay: Payer: Medicare HMO | Attending: Hematology and Oncology | Admitting: Hematology and Oncology

## 2020-09-23 NOTE — Assessment & Plan Note (Deleted)
Macrocytic anemia: With a history of stage III CKD, COVID-19 infection, hospitalization November 2020: Received blood transfusion and B12 supplementation  Lab review:  03/04/2016: Hemoglobin 12.9 10/20/2019: Hemoglobin 8.5, MCV 93.3 11/08/2019: Hemoglobin 6.9, MCV 785.8, folic acid 85.0, Y77 412, ferritin 290, iron saturation 10%, TIBC 147 08/29/2020: Hemoglobin 9.2, MCV 103, RDW 13.1, platelets 124, ferritin 1059, iron saturation 37%, TIBC 878, M76 720, folic acid 94.7, creatinine 5.05  Based on this data we can conclude that the patient has anemia of chronic kidney disease. We discussed the role of erythropoietin stimulating agents and how they can reduce the frequency of worsening anemia or requirement for blood transfusion.

## 2020-11-03 ENCOUNTER — Ambulatory Visit: Payer: Medicare HMO | Admitting: Internal Medicine

## 2020-11-03 NOTE — Progress Notes (Deleted)
Name: Diamond [name]  MRN/ DOB: [id number], 10-27-1951    Age/ Sex: 69 y.o., female    PCP: [name], MD   Reason for Endocrinology Evaluation: Hyperparathyroidism     Date of Initial Endocrinology Evaluation: 11/03/2020     HPI: Ms. Diamond [name] is a 69 y.o. female with a past medical history of HTN and dyslipidemia . The patient presented for initial endocrinology clinic visit on 11/03/2020 for consultative assistance with her Hyperparathyroidism.   ***  HISTORY:  Past Medical History:  Past Medical History:  Diagnosis Date  . Asthma   . Chronic kidney disease   . Gout   . Hypertension     Past Surgical History:  Past Surgical History:  Procedure Laterality Date  . CESAREAN SECTION  1989      Social History:  reports that she has never smoked. She has never used smokeless tobacco. She reports that she does not drink alcohol and does not use drugs.  Family History: family history includes Colon cancer in her brother; Hypertension in her father and mother; Lung cancer in her brother.   HOME MEDICATIONS: Allergies as of 11/03/2020   No Known Allergies     Medication List       Accurate as of November 03, 2020  9:02 AM. If you have any questions, ask your nurse or doctor.        Advair Diskus 250-50 MCG/DOSE Aepb Generic drug: Fluticasone-Salmeterol Inhale 1 puff into the lungs 2 (two) times daily.   albuterol 108 (90 Base) MCG/ACT inhaler Commonly known as: VENTOLIN HFA Inhale 2 puffs into the lungs every 4 (four) hours as needed for wheezing or shortness of breath.   apixaban 2.5 MG Tabs tablet Commonly known as: ELIQUIS Take 1 tablet (2.5 mg total) by mouth 2 (two) times daily.   atenolol 25 MG tablet Commonly known as: TENORMIN Take 25 mg by mouth daily.   budesonide-formoterol 160-4.5 MCG/ACT inhaler Commonly known as: SYMBICORT Inhale 2 puffs into the lungs 2 (two) times daily.   cyclobenzaprine 5 MG tablet Commonly known as:  FLEXERIL Take 1 tablet (5 mg total) by mouth 2 (two) times daily as needed for muscle spasms.   montelukast 10 MG tablet Commonly known as: SINGULAIR Take 10 mg by mouth at bedtime.   traMADol 50 MG tablet Commonly known as: ULTRAM Take 1-2 tablets (50-100 mg total) by mouth every 12 (twelve) hours as needed for up to 10 doses for moderate pain or severe pain.   verapamil 120 MG tablet Commonly known as: CALAN Take 120 mg by mouth 3 (three) times daily.         REVIEW OF SYSTEMS: A comprehensive ROS was conducted with the patient and is negative except as per HPI and below:  ROS     OBJECTIVE:  VS: There were no vitals taken for this visit.   Wt Readings from Last 3 Encounters:  05/29/20 184 lb (83.5 kg)  12/27/19 187 lb (84.8 kg)  11/06/19 187 lb 2.7 oz (84.9 kg)     EXAM: General: Pt appears well and is in NAD  Hydration: Well-hydrated with moist mucous membranes and good skin turgor  Eyes: External eye exam normal without stare, lid lag or exophthalmos.  EOM intact.  PERRL.  Ears, Nose, Throat: Hearing: Grossly intact bilaterally Dental: Good dentition  Throat: Clear without mass, erythema or exudate  Neck: General: Supple without adenopathy. Thyroid: Thyroid size normal.  No goiter or nodules appreciated. No  thyroid bruit.  Lungs: Clear with good BS bilat with no rales, rhonchi, or wheezes  Heart: Auscultation: RRR.  Abdomen: Normoactive bowel sounds, soft, nontender, without masses or organomegaly palpable  Extremities: Gait and station: Normal gait  Digits and nails: No clubbing, cyanosis, petechiae, or nodes Head and neck: Normal alignment and mobility BL UE: Normal ROM and strength. BL LE: No pretibial edema normal ROM and strength.  Skin: Hair: Texture and amount normal with gender appropriate distribution Skin Inspection: No rashes, acanthosis nigricans/skin tags. No lipohypertrophy Skin Palpation: Skin temperature, texture, and thickness normal to  palpation  Neuro: Cranial nerves: II - XII grossly intact  Cerebellar: Normal coordination and movement; no tremor Motor: Normal strength throughout DTRs: 2+ and symmetric in UE without delay in relaxation phase  Mental Status: Judgment, insight: Intact Orientation: Oriented to time, place, and person Memory: Intact for recent and remote events Mood and affect: No depression, anxiety, or agitation     DATA REVIEWED: ***    ASSESSMENT/PLAN/RECOMMENDATIONS:   1. ***    Medications :  Signed electronically by: Mack Guise, MD  [facility] Endocrinology  [facility] [address] [place], [place] [address] Phone: [phone] FAX: [phone]   CC: [name], [address] [place] [address] Phone: [phone] Fax: [phone]   Return to Endocrinology clinic as below: Future Appointments  Date Time Provider [place]  11/03/2020  9:50 AM Montrey Buist, Melanie Crazier, MD [facility] None  12/31/2020  2:00 PM Elysia Grand, Melanie Crazier, MD [facility] None

## 2020-12-02 ENCOUNTER — Ambulatory Visit (HOSPITAL_COMMUNITY)
Admission: EM | Admit: 2020-12-02 | Discharge: 2020-12-02 | Disposition: A | Discharge status: Home / Self Care | Payer: Medicare HMO

## 2020-12-31 ENCOUNTER — Ambulatory Visit (INDEPENDENT_AMBULATORY_CARE_PROVIDER_SITE_OTHER): Payer: Medicare PPO | Admitting: Internal Medicine

## 2020-12-31 ENCOUNTER — Other Ambulatory Visit: Payer: Self-pay

## 2020-12-31 ENCOUNTER — Encounter: Payer: Self-pay | Admitting: Internal Medicine

## 2020-12-31 VITALS — BP 150/80 | HR 62 | Ht 67.0 in | Wt 182.2 lb

## 2020-12-31 DIAGNOSIS — E559 Vitamin D deficiency, unspecified: Secondary | ICD-10-CM

## 2020-12-31 DIAGNOSIS — E213 Hyperparathyroidism, unspecified: Secondary | ICD-10-CM | POA: Diagnosis not present

## 2020-12-31 NOTE — Progress Notes (Signed)
Name: Diamond [name]  MRN/ DOB: [id number], [birth year]    Age/ Sex: 70 y.o., female    PCP: [name], MD   Reason for Endocrinology Evaluation: Elevated PTH     Date of Initial Endocrinology Evaluation: 12/31/2020     HPI: Diamond [name] is a 70 y.o. female with a past medical history of CKD IV, Asthma, vitamin D deficiency , HTN . The patient presented for initial endocrinology clinic visit on 12/31/2020 for consultative assistance with her Elevated PTH    Diamond [name] indicates that she was first diagnosed with hypercalcemia in 08/2020 with a serum calcium of 10.6 mg/dL ( non-corrected). She has experienced symptoms of  polydipsia, but denies constipation, polyuria,generalized weakness, diffuse muscle pains.  She is on Multivitamin but  lithium, HCTZ.  She denies history of kidney stones, liver disease, granulomatous disease but has stage IV CKD. She denies  osteoporosis or prior fractures. Daily dietary calcium intake: minimal . She denies  family history of osteoporosis, parathyroid disease, thyroid disease.   Nephrology Dr. Marval Regal - dialysis has been offered but she is not ready.      HISTORY:  Past Medical History:  Past Medical History:  Diagnosis Date  . Asthma   . Chronic kidney disease   . Gout   . Hypertension    Past Surgical History:  Past Surgical History:  Procedure Laterality Date  . CESAREAN SECTION  1989      Social History:  reports that she has never smoked. She has never used smokeless tobacco. She reports that she does not drink alcohol and does not use drugs.  Family History: family history includes Colon cancer in her brother; Hypertension in her father and mother; Lung cancer in her brother.   HOME MEDICATIONS: Allergies as of 12/31/2020   No Known Allergies     Medication List       Accurate as of December 31, 2020  2:29 PM. If you have any questions, ask your nurse or doctor.        STOP taking these medications    apixaban 2.5 MG Tabs tablet Commonly known as: ELIQUIS Stopped by: Dorita Sciara, MD   cyclobenzaprine 5 MG tablet Commonly known as: FLEXERIL Stopped by: Dorita Sciara, MD   Fluticasone-Salmeterol 250-50 MCG/DOSE Aepb Commonly known as: ADVAIR Stopped by: Dorita Sciara, MD     TAKE these medications   albuterol 108 (90 Base) MCG/ACT inhaler Commonly known as: VENTOLIN HFA Inhale 2 puffs into the lungs every 4 (four) hours as needed for wheezing or shortness of breath.   atenolol 25 MG tablet Commonly known as: TENORMIN Take 25 mg by mouth daily.   budesonide-formoterol 160-4.5 MCG/ACT inhaler Commonly known as: SYMBICORT Inhale 2 puffs into the lungs 2 (two) times daily.   montelukast 10 MG tablet Commonly known as: SINGULAIR Take 10 mg by mouth at bedtime.   traMADol 50 MG tablet Commonly known as: ULTRAM Take 1-2 tablets (50-100 mg total) by mouth every 12 (twelve) hours as needed for up to 10 doses for moderate pain or severe pain.   verapamil 120 MG tablet Commonly known as: CALAN Take 120 mg by mouth 3 (three) times daily.         REVIEW OF SYSTEMS: A comprehensive ROS was conducted with the patient and is negative except as per HPI    OBJECTIVE:  VS: BP (!) 150/80   Pulse 62   Ht 5\' 7"  (1.702 m)  Wt 182 lb 4 oz (82.7 kg)   SpO2 98%   BMI 28.54 kg/m    Wt Readings from Last 3 Encounters:  12/31/20 182 lb 4 oz (82.7 kg)  05/29/20 184 lb (83.5 kg)  12/27/19 187 lb (84.8 kg)     EXAM: General: Pt appears well and is in NAD  Neck: General: Supple without adenopathy. Thyroid: Thyroid size normal.  No goiter or nodules appreciated.  Lungs: Clear with good BS bilat with no rales, rhonchi, or wheezes  Heart: Auscultation: RRR.  Abdomen: Normoactive bowel sounds, soft, nontender, without masses or organomegaly palpable  Extremities:  BL LE: No pretibial edema normal ROM and strength.  Skin: Hair: Texture and amount normal  with gender appropriate distribution Skin Inspection: No rashes Skin Palpation: Skin temperature, texture, and thickness normal to palpation  Neuro: Cranial nerves: II - XII grossly intact  Motor: Normal strength throughout DTRs: 2+ and symmetric in UE without delay in relaxation phase  Mental Status: Judgment, insight: Intact Mood and affect: No depression, anxiety, or agitation     DATA REVIEWED:  Results for Diamond [name], Diamond [name] (MRN [id number]) as of 01/02/2021 08:15  Ref. Range 12/31/2020 14:56  Sodium Latest Ref Range: 135 - 146 mmol/L 142  Potassium Latest Ref Range: 3.5 - 5.3 mmol/L 3.5  Chloride Latest Ref Range: 98 - 110 mmol/L 108  CO2 Latest Ref Range: 20 - 32 mmol/L 23  Glucose Latest Ref Range: 65 - 99 mg/dL 97  BUN Latest Ref Range: 7 - 25 mg/dL 82 (H)  Creatinine Latest Ref Range: 0.60 - 0.93 mg/dL 5.76 (H)  Calcium Latest Ref Range: 8.6 - 10.4 mg/dL 10.3  BUN/Creatinine Ratio Latest Ref Range: 6 - 22 (calc) 14  Calcium Ionized Latest Ref Range: 4.8 - 5.6 mg/dL 5.80 (H)  Vitamin D, 25-Hydroxy Latest Ref Range: 30 - 100 ng/mL 18 (L)  PTH, Intact Latest Ref Range: 14 - 64 pg/mL 363 (H)  Albumin MSPROF Latest Ref Range: 3.6 - 5.1 g/dL 3.8        08/29/2020 TSH 3.8 uIU/mL  PTH 446 pg/mL  Vitamin D 25.9 ng/mL   BUN/Cr 79/5.05 GFR 9 Ca 10.6 mg/dL  Ionized calcium 5.6 mg/dL ( 4.5-5.6)  ASSESSMENT/PLAN/RECOMMENDATIONS:   1. PTH- Mediated Hypercalcemia :  - Primary vs tertiary secondary to CKD .  - Vitamin D is very low - will replenish first prior to proceeding with 24-hr urine collection  - She will need further work up to determine candidacy for parathyroidectomy  - Part of her PTH elevation is secondary to CKD  - Corrected calcium slightly elevated at 10.46 mg/dL , ionized calcium elevated at 5.8 mg/dL   - Pt encouraged to stay hydrated  - Will stop MVI    2. Vitamin D Deficiency   - Will start OTC Vitamin D3 2000 iu daily      F/U in 3 months    Signed electronically by: Mack Guise, MD  [facility] Endocrinology  [facility] [address] [place], [place] [address] Phone: [phone] FAX: [phone]   CC: [name], [place] [address] [address] Phone: [phone] Fax: [phone]   Return to Endocrinology clinic as below: No future appointments.

## 2020-12-31 NOTE — Patient Instructions (Signed)
24-Hour Urine Collection   You will be collecting your urine for a 24-hour period of time.  Your timer starts with your first urine of the morning (For example - If you first pee at 9AM, your timer will start at 9AM)  Throw away your first urine of the morning  Collect your urine every time you pee for the next 24 hours STOP your urine collection 24 hours after you started the collection (For example - You would stop at 9AM the day after you started)  

## 2021-01-01 LAB — BASIC METABOLIC PANEL
BUN/Creatinine Ratio: 14 (calc) (ref 6–22)
BUN: 82 mg/dL — ABNORMAL HIGH (ref 7–25)
CO2: 23 mmol/L (ref 20–32)
Calcium: 10.3 mg/dL (ref 8.6–10.4)
Chloride: 108 mmol/L (ref 98–110)
Creat: 5.76 mg/dL — ABNORMAL HIGH (ref 0.60–0.93)
Glucose, Bld: 97 mg/dL (ref 65–99)
Potassium: 3.5 mmol/L (ref 3.5–5.3)
Sodium: 142 mmol/L (ref 135–146)

## 2021-01-01 LAB — VITAMIN D 25 HYDROXY (VIT D DEFICIENCY, FRACTURES): Vit D, 25-Hydroxy: 18 ng/mL — ABNORMAL LOW (ref 30–100)

## 2021-01-01 LAB — PARATHYROID HORMONE, INTACT (NO CA): PTH: 363 pg/mL — ABNORMAL HIGH (ref 14–64)

## 2021-01-01 LAB — CALCIUM, IONIZED: Calcium, Ion: 5.8 mg/dL — ABNORMAL HIGH (ref 4.8–5.6)

## 2021-01-01 LAB — ALBUMIN: Albumin: 3.8 g/dL (ref 3.6–5.1)

## 2021-01-02 ENCOUNTER — Telehealth: Payer: Self-pay | Admitting: Internal Medicine

## 2021-01-02 DIAGNOSIS — E559 Vitamin D deficiency, unspecified: Secondary | ICD-10-CM | POA: Insufficient documentation

## 2021-01-02 NOTE — Telephone Encounter (Addendum)
Please let the pt know that her calcium is high and her Vitamin D is VERY low.   She needs to start over the counter Vitamin D 2000 iu daily    She will eventually need 24- hour urine collection but this will have to be put on hold until her vitamin D is normal   Please schedule her to see me in 3 months    Thanks  Cromwell, MD  [facility] Endocrinology  [facility] [address] [place], [place] [address] Phone: [phone] FAX: [phone]

## 2021-01-02 NOTE — Telephone Encounter (Signed)
Please call patient and schedule a 3 months follow up appointment  Spoken to patient and notified Dr [facility] comments. Verbalized understanding.

## 2021-01-02 NOTE — Telephone Encounter (Addendum)
Message left for patient to return my call.  Need to stop multivitamin. Need 3 month follow up appointment.

## 2021-01-12 ENCOUNTER — Ambulatory Visit: Payer: Medicare HMO | Admitting: Internal Medicine

## 2021-01-16 ENCOUNTER — Other Ambulatory Visit: Payer: Self-pay

## 2021-01-16 ENCOUNTER — Ambulatory Visit (HOSPITAL_COMMUNITY)
Admission: EM | Admit: 2021-01-16 | Discharge: 2021-01-16 | Disposition: A | Discharge status: Home / Self Care | Payer: Medicare HMO | Attending: Medical Oncology | Admitting: Medical Oncology

## 2021-01-16 ENCOUNTER — Ambulatory Visit (INDEPENDENT_AMBULATORY_CARE_PROVIDER_SITE_OTHER): Payer: Medicare HMO

## 2021-01-16 ENCOUNTER — Encounter (HOSPITAL_COMMUNITY): Payer: Self-pay | Admitting: Emergency Medicine

## 2021-01-16 DIAGNOSIS — M25541 Pain in joints of right hand: Secondary | ICD-10-CM

## 2021-01-16 DIAGNOSIS — M79641 Pain in right hand: Secondary | ICD-10-CM

## 2021-01-16 MED ORDER — PREDNISONE 10 MG (21) PO TBPK: ORAL_TABLET | Freq: Every day | ORAL | 0 refills | Status: DC

## 2021-01-16 NOTE — ED Provider Notes (Signed)
[facility] [place]    CSN: [id number] Arrival date & time: 01/16/21  1628     History   Chief Complaint Chief Complaint  Patient presents with  . Hand Pain    HPI Diamond [name] is a 70 y.o. female.   HPI   Hand Pain: Pt reports that for the past 3 days she has had right hand and finger pain. There is also joint stiffness. No known injury. Feels similar to past gout flares of her knees but has not had gout in another location other than her knees previously. She has eaten more red meat this week which may have triggered todays event. She denies fever, chills, skin breakdown. Tylenol at home has not helpful. Of note she does have CKD 3.   Past Medical History:  Diagnosis Date  . Asthma   . Chronic kidney disease   . Gout   . Hypertension     Patient Active Problem List   Diagnosis Date Noted  . Vitamin D deficiency 01/02/2021  . Hyperparathyroidism ([place]) 12/31/2020  . CKD (chronic kidney disease), stage III ([place]) 11/10/2019  . Cardiomegaly 11/05/2019  . Hyperkalemia 11/05/2019  . Hypokalemia 10/18/2019  . Pneumonia due to COVID-19 virus 10/17/2019  . CAP (community acquired pneumonia) 10/16/2019  . Hypertension   . Asthma   . Gout   . AKI (acute kidney injury) ([place])   . Hyponatremia   . Anemia of chronic disease     Past Surgical History:  Procedure Laterality Date  . CESAREAN SECTION  1989    OB History   No obstetric history on file.      Home Medications    Prior to Admission medications   Medication Sig Start Date End Date Taking? Authorizing Provider  predniSONE (STERAPRED UNI-PAK 21 TAB) 10 MG (21) TBPK tablet Take by mouth daily. Take 6 tabs by mouth daily  for 2 days, then 5 tabs for 2 days, then 4 tabs for 2 days, then 3 tabs for 2 days, 2 tabs for 2 days, then 1 tab by mouth daily for 2 days 01/16/21  Yes Ehan Freas M, PA-C  albuterol (VENTOLIN HFA) 108 (90 Base) MCG/ACT inhaler Inhale 2 puffs into the lungs every 4 (four) hours as  needed for wheezing or shortness of breath. 10/08/19   [provider]  atenolol (TENORMIN) 25 MG tablet Take 25 mg by mouth daily.    [provider]  budesonide-formoterol (SYMBICORT) 160-4.5 MCG/ACT inhaler Inhale 2 puffs into the lungs 2 (two) times daily.    [provider]  montelukast (SINGULAIR) 10 MG tablet Take 10 mg by mouth at bedtime. Patient not taking: Reported on 12/31/2020    [provider]  traMADol (ULTRAM) 50 MG tablet Take 1-2 tablets (50-100 mg total) by mouth every 12 (twelve) hours as needed for up to 10 doses for moderate pain or severe pain. 05/10/20   Scot Jun, FNP  verapamil (CALAN) 120 MG tablet Take 120 mg by mouth 3 (three) times daily.    [provider]    Family History Family History  Problem Relation Age of Onset  . Hypertension Mother   . Hypertension Father   . Colon cancer Brother   . Lung cancer Brother     Social History Social History   Tobacco Use  . Smoking status: Never Smoker  . Smokeless tobacco: Never Used  Vaping Use  . Vaping Use: Never used  Substance Use Topics  . Alcohol use: No  .  Drug use: No     Allergies   Patient has no known allergies.   Review of Systems Review of Systems  As stated above in HPI Physical Exam Triage Vital Signs ED Triage Vitals  Enc Vitals Group     BP 01/16/21 1655 131/67     Pulse Rate 01/16/21 1655 63     Resp 01/16/21 1655 18     Temp 01/16/21 1655 98.7 F (37.1 C)     Temp Source 01/16/21 1655 Oral     SpO2 01/16/21 1655 98 %     Weight --      Height --      Head Circumference --      Peak Flow --      Pain Score 01/16/21 1654 7     Pain Loc --      Pain Edu? --      Excl. in [place]? --    No data found.  Updated Vital Signs BP 131/67 (BP Location: Left Arm)   Pulse 63   Temp 98.7 F (37.1 C) (Oral)   Resp 18   SpO2 98%   Physical Exam Vitals and nursing note reviewed.  Constitutional:      General: She is not in  acute distress.    Appearance: Normal appearance. She is not ill-appearing, toxic-appearing or diaphoretic.  Skin:    General: Skin is warm.     Findings: Erythema (mild erythema of the right second MCP joint without evidnece of skin breakdown) present.  Neurological:     General: No focal deficit present.     Sensory: No sensory deficit.    UC Treatments / Results  Labs (all labs ordered are listed, but only abnormal results are displayed) Labs Reviewed - No data to display  EKG   Radiology No results found.  Procedures Procedures (including critical care time)  Medications Ordered in UC Medications - No data to display  Initial Impression / Assessment and Plan / UC Course  I have reviewed the triage vital signs and the nursing notes.  Pertinent labs & imaging results that were available during my care of the patient were reviewed by me and considered in my medical decision making (see chart for details).     New. Likely gout however her edema and stiffness extends to her wrist. Will image to help differential between other differentials.  Update: Review of x ray suggestive of gout.  Given her CKD3 history and recent BMP will avoid colchicine and NSAIDs. Will treat with prednisone. Discussed how to use along with common potential side effects and precautions.    Final Clinical Impressions(s) / UC Diagnoses   Final diagnoses:  Right hand pain   Discharge Instructions   None    ED Prescriptions    Medication Sig Dispense Auth. Provider   predniSONE (STERAPRED UNI-PAK 21 TAB) 10 MG (21) TBPK tablet Take by mouth daily. Take 6 tabs by mouth daily  for 2 days, then 5 tabs for 2 days, then 4 tabs for 2 days, then 3 tabs for 2 days, 2 tabs for 2 days, then 1 tab by mouth daily for 2 days 42 tablet Sheyanne Munley, Holli Humbles, [place]     PDMP not reviewed this encounter.   Hughie Closs, [place] 01/16/21 1954

## 2021-01-16 NOTE — ED Triage Notes (Signed)
Pt presents with right hand pain and swelling xs 3 days. Has hx of gout. States tylenol has not given relief for swelling or pain.

## 2021-01-29 DIAGNOSIS — N184 Chronic kidney disease, stage 4 (severe): Secondary | ICD-10-CM | POA: Diagnosis not present

## 2021-01-29 DIAGNOSIS — R1030 Lower abdominal pain, unspecified: Secondary | ICD-10-CM | POA: Diagnosis not present

## 2021-01-29 DIAGNOSIS — E559 Vitamin D deficiency, unspecified: Secondary | ICD-10-CM | POA: Diagnosis not present

## 2021-01-29 DIAGNOSIS — I10 Essential (primary) hypertension: Secondary | ICD-10-CM | POA: Diagnosis not present

## 2021-01-29 DIAGNOSIS — J4530 Mild persistent asthma, uncomplicated: Secondary | ICD-10-CM | POA: Diagnosis not present

## 2021-01-29 DIAGNOSIS — M109 Gout, unspecified: Secondary | ICD-10-CM | POA: Diagnosis not present

## 2021-01-29 DIAGNOSIS — E7800 Pure hypercholesterolemia, unspecified: Secondary | ICD-10-CM | POA: Diagnosis not present

## 2021-02-20 DIAGNOSIS — E559 Vitamin D deficiency, unspecified: Secondary | ICD-10-CM | POA: Diagnosis not present

## 2021-02-20 DIAGNOSIS — J4530 Mild persistent asthma, uncomplicated: Secondary | ICD-10-CM | POA: Diagnosis not present

## 2021-02-20 DIAGNOSIS — N184 Chronic kidney disease, stage 4 (severe): Secondary | ICD-10-CM | POA: Diagnosis not present

## 2021-02-20 DIAGNOSIS — M109 Gout, unspecified: Secondary | ICD-10-CM | POA: Diagnosis not present

## 2021-02-20 DIAGNOSIS — B027 Disseminated zoster: Secondary | ICD-10-CM | POA: Diagnosis not present

## 2021-02-20 DIAGNOSIS — E7800 Pure hypercholesterolemia, unspecified: Secondary | ICD-10-CM | POA: Diagnosis not present

## 2021-02-20 DIAGNOSIS — I10 Essential (primary) hypertension: Secondary | ICD-10-CM | POA: Diagnosis not present

## 2021-03-19 DIAGNOSIS — E559 Vitamin D deficiency, unspecified: Secondary | ICD-10-CM | POA: Diagnosis not present

## 2021-03-19 DIAGNOSIS — I10 Essential (primary) hypertension: Secondary | ICD-10-CM | POA: Diagnosis not present

## 2021-03-19 DIAGNOSIS — J4530 Mild persistent asthma, uncomplicated: Secondary | ICD-10-CM | POA: Diagnosis not present

## 2021-03-19 DIAGNOSIS — K219 Gastro-esophageal reflux disease without esophagitis: Secondary | ICD-10-CM | POA: Diagnosis not present

## 2021-03-19 DIAGNOSIS — N2581 Secondary hyperparathyroidism of renal origin: Secondary | ICD-10-CM | POA: Diagnosis not present

## 2021-03-19 DIAGNOSIS — Z0001 Encounter for general adult medical examination with abnormal findings: Secondary | ICD-10-CM | POA: Diagnosis not present

## 2021-03-19 DIAGNOSIS — E7800 Pure hypercholesterolemia, unspecified: Secondary | ICD-10-CM | POA: Diagnosis not present

## 2021-03-19 DIAGNOSIS — B0229 Other postherpetic nervous system involvement: Secondary | ICD-10-CM | POA: Diagnosis not present

## 2021-03-19 DIAGNOSIS — D539 Nutritional anemia, unspecified: Secondary | ICD-10-CM | POA: Diagnosis not present

## 2021-04-06 ENCOUNTER — Ambulatory Visit: Payer: Medicare PPO | Admitting: Internal Medicine

## 2021-04-06 NOTE — Progress Notes (Deleted)
Name: Diamond [name]  MRN/ DOB: [id number], 03-09-1951    Age/ Sex: 70 y.o., female     PCP: [name], MD   Reason for Endocrinology Evaluation: Hypercalcemia     Initial Endocrinology Clinic Visit: 12/31/2020    PATIENT IDENTIFIER: Diamond [name] is a 70 y.o., female with a past medical history of CKD IV, Asthma, vitamin D deficiency , HTN . She has followed with [facility] since 12/31/2020 for consultative assistance with management of her hypercalcemia.   HISTORICAL SUMMARY:  Diamond [name] indicates that she was first diagnosed with hypercalcemia in 08/2020 with a serum calcium of 10.6 mg/dL ( non-corrected).   Nephrology Dr. Marval Regal - dialysis has been offered but she is not ready.    SUBJECTIVE:    Today (04/06/2021):  Diamond [name] is here for hypercalcemia.      HISTORY:  Past Medical History:  Past Medical History:  Diagnosis Date  . Asthma   . Chronic kidney disease   . Gout   . Hypertension     Past Surgical History:  Past Surgical History:  Procedure Laterality Date  . CESAREAN SECTION  1989     Social History:  reports that she has never smoked. She has never used smokeless tobacco. She reports that she does not drink alcohol and does not use drugs. Family History:  Family History  Problem Relation Age of Onset  . Hypertension Mother   . Hypertension Father   . Colon cancer Brother   . Lung cancer Brother       HOME MEDICATIONS: Allergies as of 04/06/2021   No Known Allergies     Medication List       Accurate as of Apr 06, 2021 12:08 PM. If you have any questions, ask your nurse or doctor.        albuterol 108 (90 Base) MCG/ACT inhaler Commonly known as: VENTOLIN HFA Inhale 2 puffs into the lungs every 4 (four) hours as needed for wheezing or shortness of breath.   atenolol 25 MG tablet Commonly known as: TENORMIN Take 25 mg by mouth daily.   budesonide-formoterol 160-4.5 MCG/ACT inhaler Commonly known  as: SYMBICORT Inhale 2 puffs into the lungs 2 (two) times daily.   montelukast 10 MG tablet Commonly known as: SINGULAIR Take 10 mg by mouth at bedtime.   predniSONE 10 MG (21) Tbpk tablet Commonly known as: STERAPRED UNI-PAK 21 TAB Take by mouth daily. Take 6 tabs by mouth daily  for 2 days, then 5 tabs for 2 days, then 4 tabs for 2 days, then 3 tabs for 2 days, 2 tabs for 2 days, then 1 tab by mouth daily for 2 days   traMADol 50 MG tablet Commonly known as: ULTRAM Take 1-2 tablets (50-100 mg total) by mouth every 12 (twelve) hours as needed for up to 10 doses for moderate pain or severe pain.   verapamil 120 MG tablet Commonly known as: CALAN Take 120 mg by mouth 3 (three) times daily.         OBJECTIVE:   PHYSICAL EXAM: VS: There were no vitals taken for this visit.   EXAM: General: Pt appears well and is in NAD  Hydration: Well-hydrated with moist mucous membranes and good skin turgor  Eyes: External eye exam normal without stare, lid lag or exophthalmos.  EOM intact.  PERRL.  Ears, Nose, Throat: Hearing: Grossly intact bilaterally Dental: Good dentition  Throat: Clear without mass, erythema or exudate  Neck: General: Supple  without adenopathy. Thyroid: Thyroid size normal.  No goiter or nodules appreciated. No thyroid bruit.  Lungs: Clear with good BS bilat with no rales, rhonchi, or wheezes  Heart: Auscultation: RRR.  Abdomen: Normoactive bowel sounds, soft, nontender, without masses or organomegaly palpable  Extremities: Gait and station: Normal gait  Digits and nails: No clubbing, cyanosis, petechiae, or nodes Head and neck: Normal alignment and mobility BL UE: Normal ROM and strength. BL LE: No pretibial edema normal ROM and strength.  Skin: Hair: Texture and amount normal with gender appropriate distribution Skin Inspection: No rashes, acanthosis nigricans/skin tags. No lipohypertrophy Skin Palpation: Skin temperature, texture, and thickness normal to  palpation  Neuro: Cranial nerves: II - XII grossly intact  Cerebellar: Normal coordination and movement; no tremor Motor: Normal strength throughout DTRs: 2+ and symmetric in UE without delay in relaxation phase  Mental Status: Judgment, insight: Intact Orientation: Oriented to time, place, and person Memory: Intact for recent and remote events Mood and affect: No depression, anxiety, or agitation     DATA REVIEWED: ***  08/29/2020 TSH 3.8 uIU/mL  PTH 446 pg/mL  Vitamin D 25.9 ng/mL   BUN/Cr 79/5.05 GFR 9 Ca 10.6 mg/dL  Ionized calcium 5.6 mg/dL ( 4.5-5.6)   ASSESSMENT / PLAN / RECOMMENDATIONS:   1.PTH- Mediated Hypercalcemia :  - Primary vs tertiary secondary to CKD .  - She will need further work up to determine candidacy for parathyroidectomy  - Part of her PTH elevation is secondary to CKD   - Pt encouraged to stay hydrated  - Will stop MVI    2. Vitamin D Deficiency   - Will start OTC Vitamin D3 2000 iu daily    Signed electronically by: Mack Guise, MD  [facility] Endocrinology  [facility] [address], [place], [place] [address] Phone: [phone] FAX: [phone]      CC: [name], [address] [address] Phone: [phone]  Fax: [phone]   Return to Endocrinology clinic as below: Future Appointments  Date Time Provider [place]  04/06/2021  3:40 PM Jourden Gilson, Melanie Crazier, MD [id number] None

## 2021-04-08 DIAGNOSIS — I959 Hypotension, unspecified: Secondary | ICD-10-CM | POA: Diagnosis not present

## 2021-04-08 DIAGNOSIS — I10 Essential (primary) hypertension: Secondary | ICD-10-CM | POA: Diagnosis not present

## 2021-04-08 DIAGNOSIS — B0229 Other postherpetic nervous system involvement: Secondary | ICD-10-CM | POA: Diagnosis not present

## 2021-04-08 DIAGNOSIS — R0781 Pleurodynia: Secondary | ICD-10-CM | POA: Diagnosis not present

## 2021-04-08 DIAGNOSIS — E7800 Pure hypercholesterolemia, unspecified: Secondary | ICD-10-CM | POA: Diagnosis not present

## 2021-04-08 DIAGNOSIS — E559 Vitamin D deficiency, unspecified: Secondary | ICD-10-CM | POA: Diagnosis not present

## 2021-04-08 DIAGNOSIS — J4530 Mild persistent asthma, uncomplicated: Secondary | ICD-10-CM | POA: Diagnosis not present

## 2021-04-09 ENCOUNTER — Encounter (HOSPITAL_COMMUNITY): Payer: Self-pay

## 2021-04-09 ENCOUNTER — Other Ambulatory Visit: Payer: Self-pay

## 2021-04-09 ENCOUNTER — Emergency Department (HOSPITAL_COMMUNITY)
Admission: EM | Admit: 2021-04-09 | Discharge: 2021-04-09 | Disposition: A | Discharge status: Home / Self Care | Payer: Medicare HMO | Attending: Emergency Medicine | Admitting: Emergency Medicine

## 2021-04-09 DIAGNOSIS — D649 Anemia, unspecified: Secondary | ICD-10-CM | POA: Insufficient documentation

## 2021-04-09 DIAGNOSIS — I129 Hypertensive chronic kidney disease with stage 1 through stage 4 chronic kidney disease, or unspecified chronic kidney disease: Secondary | ICD-10-CM | POA: Diagnosis not present

## 2021-04-09 DIAGNOSIS — Z20822 Contact with and (suspected) exposure to covid-19: Secondary | ICD-10-CM | POA: Insufficient documentation

## 2021-04-09 DIAGNOSIS — N183 Chronic kidney disease, stage 3 unspecified: Secondary | ICD-10-CM | POA: Diagnosis not present

## 2021-04-09 DIAGNOSIS — N189 Chronic kidney disease, unspecified: Secondary | ICD-10-CM

## 2021-04-09 DIAGNOSIS — Z79899 Other long term (current) drug therapy: Secondary | ICD-10-CM | POA: Insufficient documentation

## 2021-04-09 DIAGNOSIS — J45909 Unspecified asthma, uncomplicated: Secondary | ICD-10-CM | POA: Diagnosis not present

## 2021-04-09 DIAGNOSIS — Z7952 Long term (current) use of systemic steroids: Secondary | ICD-10-CM | POA: Diagnosis not present

## 2021-04-09 DIAGNOSIS — D631 Anemia in chronic kidney disease: Secondary | ICD-10-CM | POA: Diagnosis not present

## 2021-04-09 DIAGNOSIS — E876 Hypokalemia: Secondary | ICD-10-CM | POA: Diagnosis not present

## 2021-04-09 LAB — COMPREHENSIVE METABOLIC PANEL
ALT: 10 U/L (ref 0–44)
AST: 12 U/L — ABNORMAL LOW (ref 15–41)
Albumin: 3.5 g/dL (ref 3.5–5.0)
Alkaline Phosphatase: 44 U/L (ref 38–126)
Anion gap: 13 (ref 5–15)
BUN: 67 mg/dL — ABNORMAL HIGH (ref 8–23)
CO2: 20 mmol/L — ABNORMAL LOW (ref 22–32)
Calcium: 10.4 mg/dL — ABNORMAL HIGH (ref 8.9–10.3)
Chloride: 102 mmol/L (ref 98–111)
Creatinine, Ser: 7.39 mg/dL — ABNORMAL HIGH (ref 0.44–1.00)
GFR, Estimated: 5 mL/min — ABNORMAL LOW (ref >60)
Glucose, Bld: 113 mg/dL — ABNORMAL HIGH (ref 70–99)
Potassium: 3.3 mmol/L — ABNORMAL LOW (ref 3.5–5.1)
Sodium: 135 mmol/L (ref 135–145)
Total Bilirubin: 0.6 mg/dL (ref 0.3–1.2)
Total Protein: 7.3 g/dL (ref 6.5–8.1)

## 2021-04-09 LAB — CBC
HCT: 23.9 % — ABNORMAL LOW (ref 36.0–46.0)
Hemoglobin: 7.8 g/dL — ABNORMAL LOW (ref 12.0–15.0)
MCH: 33.1 pg (ref 26.0–34.0)
MCHC: 32.6 g/dL (ref 30.0–36.0)
MCV: 101.3 fL — ABNORMAL HIGH (ref 80.0–100.0)
Platelets: 206 10*3/uL (ref 150–400)
RBC: 2.36 MIL/uL — ABNORMAL LOW (ref 3.87–5.11)
RDW: 14.5 % (ref 11.5–15.5)
WBC: 6.5 10*3/uL (ref 4.0–10.5)
nRBC: 0 % (ref 0.0–0.2)

## 2021-04-09 LAB — TYPE AND SCREEN
ABO/RH(D): O POS
Antibody Screen: NEGATIVE

## 2021-04-09 LAB — POC OCCULT BLOOD, ED: Fecal Occult Bld: NEGATIVE

## 2021-04-09 NOTE — ED Triage Notes (Signed)
Patient states her PCP called her today and stated that she " had a drop in her blood count and to come to the ED." patient denies seeing any visible blood.

## 2021-04-09 NOTE — ED Provider Notes (Signed)
[facility] DEPT Provider Note   CSN: [id number] Arrival date & time: 04/09/21  1533     History Chief Complaint  Patient presents with  . Abnormal Lab    Diamond [name] is a 70 y.o. female.  Patient with history of chronic kidney disease.  Saw primary care doctor yesterday.  They called her to come to the ED today because her hemoglobin was low.  She denies any melena.  Denies any chest pain or shortness of breath.  Appears that she is may needed transfusions in the past.  The history is provided by the patient.  Illness Location:  [place] Severity:  Mild Onset quality:  Gradual Timing:  Constant Progression:  Unchanged Chronicity:  New Relieved by:  Nothing Worsened by:  Nothing Associated symptoms: no abdominal pain, no chest pain, no cough, no ear pain, no fever, no rash, no shortness of breath, no sore throat and no vomiting        Past Medical History:  Diagnosis Date  . Asthma   . Chronic kidney disease   . Gout   . Hypertension     Patient Active Problem List   Diagnosis Date Noted  . Vitamin D deficiency 01/02/2021  . Hyperparathyroidism ([place]) 12/31/2020  . CKD (chronic kidney disease), stage III ([place]) 11/10/2019  . Cardiomegaly 11/05/2019  . Hyperkalemia 11/05/2019  . Hypokalemia 10/18/2019  . Pneumonia due to COVID-19 virus 10/17/2019  . CAP (community acquired pneumonia) 10/16/2019  . Hypertension   . Asthma   . Gout   . AKI (acute kidney injury) ([place])   . Hyponatremia   . Anemia of chronic disease     Past Surgical History:  Procedure Laterality Date  . CESAREAN SECTION  1989     OB History   No obstetric history on file.     Family History  Problem Relation Age of Onset  . Hypertension Mother   . Hypertension Father   . Colon cancer Brother   . Lung cancer Brother     Social History   Tobacco Use  . Smoking status: Never Smoker  . Smokeless tobacco: Never Used  Vaping Use  . Vaping Use: Never  used  Substance Use Topics  . Alcohol use: No  . Drug use: No    Home Medications Prior to Admission medications   Medication Sig Start Date End Date Taking? Authorizing Provider  albuterol (VENTOLIN HFA) 108 (90 Base) MCG/ACT inhaler Inhale 2 puffs into the lungs every 4 (four) hours as needed for wheezing or shortness of breath. 10/08/19   [provider]  atenolol (TENORMIN) 25 MG tablet Take 25 mg by mouth daily.    [provider]  budesonide-formoterol (SYMBICORT) 160-4.5 MCG/ACT inhaler Inhale 2 puffs into the lungs 2 (two) times daily.    [provider]  montelukast (SINGULAIR) 10 MG tablet Take 10 mg by mouth at bedtime. Patient not taking: Reported on 12/31/2020    [provider]  predniSONE (STERAPRED UNI-PAK 21 TAB) 10 MG (21) TBPK tablet Take by mouth daily. Take 6 tabs by mouth daily  for 2 days, then 5 tabs for 2 days, then 4 tabs for 2 days, then 3 tabs for 2 days, 2 tabs for 2 days, then 1 tab by mouth daily for 2 days 01/16/21   Hughie Closs, PA-C  traMADol (ULTRAM) 50 MG tablet Take 1-2 tablets (50-100 mg total) by mouth every 12 (twelve) hours as needed for up to 10 doses for  moderate pain or severe pain. 05/10/20   Scot Jun, FNP  verapamil (CALAN) 120 MG tablet Take 120 mg by mouth 3 (three) times daily.    [provider]    Allergies    Patient has no known allergies.  Review of Systems   Review of Systems  Constitutional: Negative for chills and fever.  HENT: Negative for ear pain and sore throat.   Eyes: Negative for pain and visual disturbance.  Respiratory: Negative for cough and shortness of breath.   Cardiovascular: Negative for chest pain and palpitations.  Gastrointestinal: Negative for abdominal pain and vomiting.  Genitourinary: Negative for dysuria and hematuria.  Musculoskeletal: Negative for arthralgias and back pain.  Skin: Negative for color change and rash.  Neurological: Negative for  seizures and syncope.  All other systems reviewed and are negative.   Physical Exam Updated Vital Signs  ED Triage Vitals  Enc Vitals Group     BP 04/09/21 1621 (!) 166/101     Pulse Rate 04/09/21 1621 67     Resp 04/09/21 1621 16     Temp 04/09/21 1541 99.3 F (37.4 C)     Temp Source 04/09/21 1541 Oral     SpO2 04/09/21 1541 100 %     Weight 04/09/21 1626 173 lb (78.5 kg)     Height 04/09/21 1626 5\' 7"  (1.702 m)     Head Circumference --      Peak Flow --      Pain Score 04/09/21 1624 5     Pain Loc --      Pain Edu? --      Excl. in [place]? --     Physical Exam Vitals and nursing note reviewed.  Constitutional:      General: She is not in acute distress.    Appearance: She is well-developed. She is not ill-appearing.  HENT:     Head: Normocephalic and atraumatic.     Mouth/Throat:     Mouth: Mucous membranes are moist.  Eyes:     Extraocular Movements: Extraocular movements intact.     Conjunctiva/sclera: Conjunctivae normal.     Pupils: Pupils are equal, round, and reactive to light.  Cardiovascular:     Rate and Rhythm: Normal rate and regular rhythm.     Pulses: Normal pulses.     Heart sounds: Normal heart sounds. No murmur heard.   Pulmonary:     Effort: Pulmonary effort is normal. No respiratory distress.     Breath sounds: Normal breath sounds.  Abdominal:     General: Abdomen is flat.     Palpations: Abdomen is soft.     Tenderness: There is no abdominal tenderness.  Genitourinary:    Rectum: Guaiac result negative.  Musculoskeletal:     Cervical back: Normal range of motion and neck supple.  Skin:    General: Skin is warm and dry.     Capillary Refill: Capillary refill takes less than 2 seconds.  Neurological:     General: No focal deficit present.     Mental Status: She is alert.     ED Results / Procedures / Treatments   Labs (all labs ordered are listed, but only abnormal results are displayed) Labs Reviewed  COMPREHENSIVE METABOLIC  PANEL - Abnormal; Notable for the following components:      Result Value   Potassium 3.3 (*)    CO2 20 (*)    Glucose, Bld 113 (*)    BUN 67 (*)  Creatinine, Ser 7.39 (*)    Calcium 10.4 (*)    AST 12 (*)    GFR, Estimated 5 (*)    All other components within normal limits  CBC - Abnormal; Notable for the following components:   RBC 2.36 (*)    Hemoglobin 7.8 (*)    HCT 23.9 (*)    MCV 101.3 (*)    All other components within normal limits  SARS CORONAVIRUS 2 (TAT 6-24 HRS)  POC OCCULT BLOOD, ED  TYPE AND SCREEN    EKG None  Radiology No results found.  Procedures Procedures   Medications Ordered in ED Medications - No data to display  ED Course  I have reviewed the triage vital signs and the nursing notes.  Pertinent labs & imaging results that were available during my care of the patient were reviewed by me and considered in my medical decision making (see chart for details).    MDM Rules/Calculators/A&P                          Diamond [name] is a 70 year old female who presents the ED for lab check.  History of CKD.  Had a hemoglobin of 7.6 yesterday at primary care doctor's office was sent for evaluation because hemoglobin was around 10 several months ago.  Patient has brown stool on exam.  She denies any anemic symptoms.  No chest pain, no shortness of breath.  Blood pressure is normal.  Orthostatics are normal.  She possibly has some low blood pressure yesterday in the office.  Lab work today shows a hemoglobin of 7.8.  Slightly improved.  Creatinine is 7.39.  Otherwise lab work is unremarkable.  Overall suspect chronic anemia in the setting of likely iron deficiency and chronic kidney disease.  Patient looks well and has normal vitals.  No need for emergent transfusion.  Recommend follow-up with primary care doctor and nephrologist.  Discharged in good condition.  This chart was dictated using voice recognition software.  Despite best efforts to proofread,   errors can occur which can change the documentation meaning.    Final Clinical Impression(s) / ED Diagnoses Final diagnoses:  Chronic kidney disease, unspecified CKD stage  Anemia, unspecified type    Rx / DC Orders ED Discharge Orders    None       Lennice Sites, DO 04/09/21 1905

## 2021-04-09 NOTE — ED Notes (Signed)
Pt refused the vitals

## 2021-04-09 NOTE — Discharge Instructions (Addendum)
Overall your blood count today was stable at 7.8.  No concern for active bleeding.  Overall suspect that this is likely from worsening chronic kidney disease.  Follow-up with your nephrologist.  Follow-up with your primary care doctor next week for repeat hemoglobin.  Please return if you develop any worsening chest pain, shortness of breath, fatigue as discussed.

## 2021-04-10 LAB — SARS CORONAVIRUS 2 (TAT 6-24 HRS): SARS Coronavirus 2: NEGATIVE

## 2021-04-16 DIAGNOSIS — M19042 Primary osteoarthritis, left hand: Secondary | ICD-10-CM | POA: Diagnosis not present

## 2021-04-16 DIAGNOSIS — N185 Chronic kidney disease, stage 5: Secondary | ICD-10-CM | POA: Diagnosis not present

## 2021-04-16 DIAGNOSIS — R7989 Other specified abnormal findings of blood chemistry: Secondary | ICD-10-CM | POA: Diagnosis not present

## 2021-04-16 DIAGNOSIS — D638 Anemia in other chronic diseases classified elsewhere: Secondary | ICD-10-CM | POA: Diagnosis not present

## 2021-04-16 DIAGNOSIS — M79642 Pain in left hand: Secondary | ICD-10-CM | POA: Diagnosis not present

## 2021-04-16 DIAGNOSIS — E876 Hypokalemia: Secondary | ICD-10-CM | POA: Diagnosis not present

## 2021-04-16 DIAGNOSIS — I10 Essential (primary) hypertension: Secondary | ICD-10-CM | POA: Diagnosis not present

## 2021-04-21 DIAGNOSIS — M19042 Primary osteoarthritis, left hand: Secondary | ICD-10-CM | POA: Diagnosis not present

## 2021-04-24 DIAGNOSIS — I959 Hypotension, unspecified: Secondary | ICD-10-CM | POA: Diagnosis not present

## 2021-04-24 DIAGNOSIS — M10371 Gout due to renal impairment, right ankle and foot: Secondary | ICD-10-CM | POA: Diagnosis not present

## 2021-04-24 DIAGNOSIS — N2581 Secondary hyperparathyroidism of renal origin: Secondary | ICD-10-CM | POA: Diagnosis not present

## 2021-04-24 DIAGNOSIS — M10372 Gout due to renal impairment, left ankle and foot: Secondary | ICD-10-CM | POA: Diagnosis not present

## 2021-04-24 DIAGNOSIS — M10342 Gout due to renal impairment, left hand: Secondary | ICD-10-CM | POA: Diagnosis not present

## 2021-04-24 DIAGNOSIS — N185 Chronic kidney disease, stage 5: Secondary | ICD-10-CM | POA: Diagnosis not present

## 2021-04-24 DIAGNOSIS — M1711 Unilateral primary osteoarthritis, right knee: Secondary | ICD-10-CM | POA: Diagnosis not present

## 2021-04-24 DIAGNOSIS — I120 Hypertensive chronic kidney disease with stage 5 chronic kidney disease or end stage renal disease: Secondary | ICD-10-CM | POA: Diagnosis not present

## 2021-04-24 DIAGNOSIS — D631 Anemia in chronic kidney disease: Secondary | ICD-10-CM | POA: Diagnosis not present

## 2021-05-05 DIAGNOSIS — D631 Anemia in chronic kidney disease: Secondary | ICD-10-CM | POA: Diagnosis not present

## 2021-05-05 DIAGNOSIS — M109 Gout, unspecified: Secondary | ICD-10-CM | POA: Diagnosis not present

## 2021-05-05 DIAGNOSIS — D509 Iron deficiency anemia, unspecified: Secondary | ICD-10-CM | POA: Diagnosis not present

## 2021-05-05 DIAGNOSIS — E559 Vitamin D deficiency, unspecified: Secondary | ICD-10-CM | POA: Diagnosis not present

## 2021-05-05 DIAGNOSIS — N390 Urinary tract infection, site not specified: Secondary | ICD-10-CM | POA: Diagnosis not present

## 2021-05-05 DIAGNOSIS — N2581 Secondary hyperparathyroidism of renal origin: Secondary | ICD-10-CM | POA: Diagnosis not present

## 2021-05-05 DIAGNOSIS — N185 Chronic kidney disease, stage 5: Secondary | ICD-10-CM | POA: Diagnosis not present

## 2021-05-05 DIAGNOSIS — R809 Proteinuria, unspecified: Secondary | ICD-10-CM | POA: Diagnosis not present

## 2021-05-05 DIAGNOSIS — I120 Hypertensive chronic kidney disease with stage 5 chronic kidney disease or end stage renal disease: Secondary | ICD-10-CM | POA: Diagnosis not present

## 2021-05-05 DIAGNOSIS — N189 Chronic kidney disease, unspecified: Secondary | ICD-10-CM | POA: Diagnosis not present

## 2021-05-06 DIAGNOSIS — I120 Hypertensive chronic kidney disease with stage 5 chronic kidney disease or end stage renal disease: Secondary | ICD-10-CM | POA: Diagnosis not present

## 2021-05-06 DIAGNOSIS — N2581 Secondary hyperparathyroidism of renal origin: Secondary | ICD-10-CM | POA: Diagnosis not present

## 2021-05-06 DIAGNOSIS — N185 Chronic kidney disease, stage 5: Secondary | ICD-10-CM | POA: Diagnosis not present

## 2021-05-06 DIAGNOSIS — I959 Hypotension, unspecified: Secondary | ICD-10-CM | POA: Diagnosis not present

## 2021-05-06 DIAGNOSIS — M1711 Unilateral primary osteoarthritis, right knee: Secondary | ICD-10-CM | POA: Diagnosis not present

## 2021-05-06 DIAGNOSIS — M10371 Gout due to renal impairment, right ankle and foot: Secondary | ICD-10-CM | POA: Diagnosis not present

## 2021-05-06 DIAGNOSIS — M10372 Gout due to renal impairment, left ankle and foot: Secondary | ICD-10-CM | POA: Diagnosis not present

## 2021-05-06 DIAGNOSIS — D631 Anemia in chronic kidney disease: Secondary | ICD-10-CM | POA: Diagnosis not present

## 2021-05-06 DIAGNOSIS — M10342 Gout due to renal impairment, left hand: Secondary | ICD-10-CM | POA: Diagnosis not present

## 2021-05-08 ENCOUNTER — Other Ambulatory Visit: Payer: Self-pay | Admitting: *Deleted

## 2021-05-08 DIAGNOSIS — N184 Chronic kidney disease, stage 4 (severe): Secondary | ICD-10-CM

## 2021-05-12 DIAGNOSIS — M1711 Unilateral primary osteoarthritis, right knee: Secondary | ICD-10-CM | POA: Diagnosis not present

## 2021-05-12 DIAGNOSIS — M10372 Gout due to renal impairment, left ankle and foot: Secondary | ICD-10-CM | POA: Diagnosis not present

## 2021-05-12 DIAGNOSIS — N2581 Secondary hyperparathyroidism of renal origin: Secondary | ICD-10-CM | POA: Diagnosis not present

## 2021-05-12 DIAGNOSIS — M10342 Gout due to renal impairment, left hand: Secondary | ICD-10-CM | POA: Diagnosis not present

## 2021-05-12 DIAGNOSIS — M10371 Gout due to renal impairment, right ankle and foot: Secondary | ICD-10-CM | POA: Diagnosis not present

## 2021-05-12 DIAGNOSIS — I959 Hypotension, unspecified: Secondary | ICD-10-CM | POA: Diagnosis not present

## 2021-05-12 DIAGNOSIS — D631 Anemia in chronic kidney disease: Secondary | ICD-10-CM | POA: Diagnosis not present

## 2021-05-12 DIAGNOSIS — I120 Hypertensive chronic kidney disease with stage 5 chronic kidney disease or end stage renal disease: Secondary | ICD-10-CM | POA: Diagnosis not present

## 2021-05-12 DIAGNOSIS — N185 Chronic kidney disease, stage 5: Secondary | ICD-10-CM | POA: Diagnosis not present

## 2021-05-15 ENCOUNTER — Inpatient Hospital Stay (HOSPITAL_COMMUNITY)
Admission: RE | Admit: 2021-05-15 | Discharge: 2021-05-15 | Disposition: A | Discharge status: Home / Self Care | Payer: Medicare HMO | Source: Ambulatory Visit | Attending: Nephrology | Admitting: Nephrology

## 2021-05-15 ENCOUNTER — Encounter (HOSPITAL_COMMUNITY): Payer: Self-pay

## 2021-05-20 DIAGNOSIS — E7800 Pure hypercholesterolemia, unspecified: Secondary | ICD-10-CM | POA: Diagnosis not present

## 2021-05-20 DIAGNOSIS — I120 Hypertensive chronic kidney disease with stage 5 chronic kidney disease or end stage renal disease: Secondary | ICD-10-CM | POA: Diagnosis not present

## 2021-05-20 DIAGNOSIS — J4530 Mild persistent asthma, uncomplicated: Secondary | ICD-10-CM | POA: Diagnosis not present

## 2021-05-20 DIAGNOSIS — I10 Essential (primary) hypertension: Secondary | ICD-10-CM | POA: Diagnosis not present

## 2021-05-20 DIAGNOSIS — M10372 Gout due to renal impairment, left ankle and foot: Secondary | ICD-10-CM | POA: Diagnosis not present

## 2021-05-20 DIAGNOSIS — M10371 Gout due to renal impairment, right ankle and foot: Secondary | ICD-10-CM | POA: Diagnosis not present

## 2021-05-20 DIAGNOSIS — M10342 Gout due to renal impairment, left hand: Secondary | ICD-10-CM | POA: Diagnosis not present

## 2021-05-20 DIAGNOSIS — N185 Chronic kidney disease, stage 5: Secondary | ICD-10-CM | POA: Diagnosis not present

## 2021-05-20 DIAGNOSIS — I959 Hypotension, unspecified: Secondary | ICD-10-CM | POA: Diagnosis not present

## 2021-05-20 DIAGNOSIS — N2581 Secondary hyperparathyroidism of renal origin: Secondary | ICD-10-CM | POA: Diagnosis not present

## 2021-05-20 DIAGNOSIS — N1832 Chronic kidney disease, stage 3b: Secondary | ICD-10-CM | POA: Diagnosis not present

## 2021-05-20 DIAGNOSIS — E876 Hypokalemia: Secondary | ICD-10-CM | POA: Diagnosis not present

## 2021-05-20 DIAGNOSIS — D631 Anemia in chronic kidney disease: Secondary | ICD-10-CM | POA: Diagnosis not present

## 2021-05-20 DIAGNOSIS — M1711 Unilateral primary osteoarthritis, right knee: Secondary | ICD-10-CM | POA: Diagnosis not present

## 2021-05-20 DIAGNOSIS — K219 Gastro-esophageal reflux disease without esophagitis: Secondary | ICD-10-CM | POA: Diagnosis not present

## 2021-05-20 DIAGNOSIS — E559 Vitamin D deficiency, unspecified: Secondary | ICD-10-CM | POA: Diagnosis not present

## 2021-05-21 ENCOUNTER — Encounter (HOSPITAL_COMMUNITY): Payer: Medicare HMO

## 2021-05-21 ENCOUNTER — Ambulatory Visit: Payer: Medicare HMO | Admitting: Vascular Surgery

## 2021-05-21 ENCOUNTER — Other Ambulatory Visit (HOSPITAL_COMMUNITY): Payer: Medicare HMO

## 2021-05-27 DIAGNOSIS — N185 Chronic kidney disease, stage 5: Secondary | ICD-10-CM | POA: Diagnosis not present

## 2021-05-27 DIAGNOSIS — M10342 Gout due to renal impairment, left hand: Secondary | ICD-10-CM | POA: Diagnosis not present

## 2021-05-27 DIAGNOSIS — M10372 Gout due to renal impairment, left ankle and foot: Secondary | ICD-10-CM | POA: Diagnosis not present

## 2021-05-27 DIAGNOSIS — D631 Anemia in chronic kidney disease: Secondary | ICD-10-CM | POA: Diagnosis not present

## 2021-05-27 DIAGNOSIS — N2581 Secondary hyperparathyroidism of renal origin: Secondary | ICD-10-CM | POA: Diagnosis not present

## 2021-05-27 DIAGNOSIS — I120 Hypertensive chronic kidney disease with stage 5 chronic kidney disease or end stage renal disease: Secondary | ICD-10-CM | POA: Diagnosis not present

## 2021-05-27 DIAGNOSIS — M1711 Unilateral primary osteoarthritis, right knee: Secondary | ICD-10-CM | POA: Diagnosis not present

## 2021-05-27 DIAGNOSIS — M10371 Gout due to renal impairment, right ankle and foot: Secondary | ICD-10-CM | POA: Diagnosis not present

## 2021-05-27 DIAGNOSIS — I959 Hypotension, unspecified: Secondary | ICD-10-CM | POA: Diagnosis not present

## 2021-05-29 ENCOUNTER — Encounter (HOSPITAL_COMMUNITY): Payer: Medicare HMO

## 2021-05-29 ENCOUNTER — Other Ambulatory Visit: Payer: Self-pay

## 2021-05-29 ENCOUNTER — Ambulatory Visit (HOSPITAL_COMMUNITY)
Admission: RE | Admit: 2021-05-29 | Discharge: 2021-05-29 | Disposition: A | Discharge status: Home / Self Care | Payer: Medicare HMO | Source: Ambulatory Visit | Attending: Nephrology | Admitting: Nephrology

## 2021-05-29 VITALS — BP 144/87 | HR 83 | Temp 97.7°F | Resp 20

## 2021-05-29 DIAGNOSIS — N183 Chronic kidney disease, stage 3 unspecified: Secondary | ICD-10-CM | POA: Diagnosis not present

## 2021-05-29 DIAGNOSIS — D638 Anemia in other chronic diseases classified elsewhere: Secondary | ICD-10-CM | POA: Diagnosis not present

## 2021-05-29 LAB — POCT HEMOGLOBIN-HEMACUE: Hemoglobin: 7.7 g/dL — ABNORMAL LOW (ref 12.0–15.0)

## 2021-05-29 MED ORDER — EPOETIN ALFA-EPBX 10000 UNIT/ML IJ SOLN
INTRAMUSCULAR | Status: AC
  Filled 2021-05-29: qty 2

## 2021-05-29 MED ORDER — EPOETIN ALFA-EPBX 10000 UNIT/ML IJ SOLN
20000.0000 [IU] | INTRAMUSCULAR | Status: DC
  Administered 2021-05-29: 20000 [IU] via SUBCUTANEOUS

## 2021-06-01 DIAGNOSIS — I10 Essential (primary) hypertension: Secondary | ICD-10-CM | POA: Diagnosis not present

## 2021-06-01 DIAGNOSIS — J4530 Mild persistent asthma, uncomplicated: Secondary | ICD-10-CM | POA: Diagnosis not present

## 2021-06-01 DIAGNOSIS — E559 Vitamin D deficiency, unspecified: Secondary | ICD-10-CM | POA: Diagnosis not present

## 2021-06-01 DIAGNOSIS — D638 Anemia in other chronic diseases classified elsewhere: Secondary | ICD-10-CM | POA: Diagnosis not present

## 2021-06-01 DIAGNOSIS — E7800 Pure hypercholesterolemia, unspecified: Secondary | ICD-10-CM | POA: Diagnosis not present

## 2021-06-01 DIAGNOSIS — E876 Hypokalemia: Secondary | ICD-10-CM | POA: Diagnosis not present

## 2021-06-05 DIAGNOSIS — M10372 Gout due to renal impairment, left ankle and foot: Secondary | ICD-10-CM | POA: Diagnosis not present

## 2021-06-05 DIAGNOSIS — I120 Hypertensive chronic kidney disease with stage 5 chronic kidney disease or end stage renal disease: Secondary | ICD-10-CM | POA: Diagnosis not present

## 2021-06-05 DIAGNOSIS — M10342 Gout due to renal impairment, left hand: Secondary | ICD-10-CM | POA: Diagnosis not present

## 2021-06-05 DIAGNOSIS — M10371 Gout due to renal impairment, right ankle and foot: Secondary | ICD-10-CM | POA: Diagnosis not present

## 2021-06-05 DIAGNOSIS — D631 Anemia in chronic kidney disease: Secondary | ICD-10-CM | POA: Diagnosis not present

## 2021-06-05 DIAGNOSIS — M1711 Unilateral primary osteoarthritis, right knee: Secondary | ICD-10-CM | POA: Diagnosis not present

## 2021-06-05 DIAGNOSIS — I959 Hypotension, unspecified: Secondary | ICD-10-CM | POA: Diagnosis not present

## 2021-06-05 DIAGNOSIS — N185 Chronic kidney disease, stage 5: Secondary | ICD-10-CM | POA: Diagnosis not present

## 2021-06-05 DIAGNOSIS — N2581 Secondary hyperparathyroidism of renal origin: Secondary | ICD-10-CM | POA: Diagnosis not present

## 2021-06-12 ENCOUNTER — Encounter (HOSPITAL_COMMUNITY)
Admission: RE | Admit: 2021-06-12 | Discharge: 2021-06-12 | Disposition: A | Discharge status: Home / Self Care | Payer: Medicare HMO | Source: Ambulatory Visit | Attending: Nephrology | Admitting: Nephrology

## 2021-06-12 VITALS — BP 129/85 | HR 85 | Temp 98.1°F | Resp 19

## 2021-06-12 DIAGNOSIS — D638 Anemia in other chronic diseases classified elsewhere: Secondary | ICD-10-CM

## 2021-06-12 DIAGNOSIS — N183 Chronic kidney disease, stage 3 unspecified: Secondary | ICD-10-CM | POA: Insufficient documentation

## 2021-06-12 LAB — POCT HEMOGLOBIN-HEMACUE: Hemoglobin: 7.3 g/dL — ABNORMAL LOW (ref 12.0–15.0)

## 2021-06-12 MED ORDER — EPOETIN ALFA-EPBX 10000 UNIT/ML IJ SOLN
INTRAMUSCULAR | Status: AC
  Administered 2021-06-12: 20000 [IU] via SUBCUTANEOUS
  Filled 2021-06-12: qty 2

## 2021-06-12 MED ORDER — EPOETIN ALFA-EPBX 10000 UNIT/ML IJ SOLN: 20000.0000 [IU] | INTRAMUSCULAR | Status: DC

## 2021-06-12 NOTE — Progress Notes (Signed)
Hemocue 7.3 today.   No signs of bleeding and no symptoms per pt.  Reported to [facility] at [facility].  No new orders received.

## 2021-06-17 DIAGNOSIS — D631 Anemia in chronic kidney disease: Secondary | ICD-10-CM | POA: Diagnosis not present

## 2021-06-17 DIAGNOSIS — I120 Hypertensive chronic kidney disease with stage 5 chronic kidney disease or end stage renal disease: Secondary | ICD-10-CM | POA: Diagnosis not present

## 2021-06-17 DIAGNOSIS — M10371 Gout due to renal impairment, right ankle and foot: Secondary | ICD-10-CM | POA: Diagnosis not present

## 2021-06-17 DIAGNOSIS — M1711 Unilateral primary osteoarthritis, right knee: Secondary | ICD-10-CM | POA: Diagnosis not present

## 2021-06-17 DIAGNOSIS — I959 Hypotension, unspecified: Secondary | ICD-10-CM | POA: Diagnosis not present

## 2021-06-17 DIAGNOSIS — N2581 Secondary hyperparathyroidism of renal origin: Secondary | ICD-10-CM | POA: Diagnosis not present

## 2021-06-17 DIAGNOSIS — N185 Chronic kidney disease, stage 5: Secondary | ICD-10-CM | POA: Diagnosis not present

## 2021-06-17 DIAGNOSIS — M10372 Gout due to renal impairment, left ankle and foot: Secondary | ICD-10-CM | POA: Diagnosis not present

## 2021-06-17 DIAGNOSIS — M10342 Gout due to renal impairment, left hand: Secondary | ICD-10-CM | POA: Diagnosis not present

## 2021-06-23 DIAGNOSIS — J4530 Mild persistent asthma, uncomplicated: Secondary | ICD-10-CM | POA: Diagnosis not present

## 2021-06-23 DIAGNOSIS — E559 Vitamin D deficiency, unspecified: Secondary | ICD-10-CM | POA: Diagnosis not present

## 2021-06-23 DIAGNOSIS — I10 Essential (primary) hypertension: Secondary | ICD-10-CM | POA: Diagnosis not present

## 2021-06-23 DIAGNOSIS — K219 Gastro-esophageal reflux disease without esophagitis: Secondary | ICD-10-CM | POA: Diagnosis not present

## 2021-06-26 ENCOUNTER — Encounter (HOSPITAL_COMMUNITY)
Admission: RE | Admit: 2021-06-26 | Discharge: 2021-06-26 | Disposition: A | Discharge status: Home / Self Care | Payer: Medicare HMO | Source: Ambulatory Visit | Attending: Nephrology | Admitting: Nephrology

## 2021-06-26 ENCOUNTER — Other Ambulatory Visit: Payer: Self-pay

## 2021-06-26 VITALS — BP 144/89 | HR 84 | Temp 98.1°F | Resp 20

## 2021-06-26 DIAGNOSIS — N183 Chronic kidney disease, stage 3 unspecified: Secondary | ICD-10-CM

## 2021-06-26 DIAGNOSIS — D638 Anemia in other chronic diseases classified elsewhere: Secondary | ICD-10-CM

## 2021-06-26 LAB — FERRITIN: Ferritin: 576 ng/mL — ABNORMAL HIGH (ref 11–307)

## 2021-06-26 LAB — IRON AND TIBC
Iron: 30 ug/dL (ref 28–170)
Saturation Ratios: 15 % (ref 10.4–31.8)
TIBC: 202 ug/dL — ABNORMAL LOW (ref 250–450)
UIBC: 172 ug/dL

## 2021-06-26 LAB — POCT HEMOGLOBIN-HEMACUE: Hemoglobin: 7.5 g/dL — ABNORMAL LOW (ref 12.0–15.0)

## 2021-06-26 MED ORDER — EPOETIN ALFA-EPBX 10000 UNIT/ML IJ SOLN
INTRAMUSCULAR | Status: AC
  Filled 2021-06-26: qty 1

## 2021-06-26 MED ORDER — EPOETIN ALFA-EPBX 10000 UNIT/ML IJ SOLN
20000.0000 [IU] | INTRAMUSCULAR | Status: DC
Start: 2021-06-26 — End: 2021-06-27
  Administered 2021-06-26: 20000 [IU] via SUBCUTANEOUS

## 2021-06-26 NOTE — Progress Notes (Signed)
In infusion clinic today for retacrit injection. Hgb 7.5. No new issues with shortness of breath or bleeding. Reported to [place] at [facility] and was instructed to continue with orders as they are and she will report to Dr Marval Regal and they will send new orders if he feels needed.

## 2021-06-30 DIAGNOSIS — N2581 Secondary hyperparathyroidism of renal origin: Secondary | ICD-10-CM | POA: Diagnosis not present

## 2021-06-30 DIAGNOSIS — D631 Anemia in chronic kidney disease: Secondary | ICD-10-CM | POA: Diagnosis not present

## 2021-06-30 DIAGNOSIS — N185 Chronic kidney disease, stage 5: Secondary | ICD-10-CM | POA: Diagnosis not present

## 2021-06-30 DIAGNOSIS — I120 Hypertensive chronic kidney disease with stage 5 chronic kidney disease or end stage renal disease: Secondary | ICD-10-CM | POA: Diagnosis not present

## 2021-07-10 ENCOUNTER — Encounter (HOSPITAL_COMMUNITY): Payer: Medicare HMO

## 2021-07-10 DIAGNOSIS — Z992 Dependence on renal dialysis: Secondary | ICD-10-CM | POA: Diagnosis not present

## 2021-07-10 DIAGNOSIS — N186 End stage renal disease: Secondary | ICD-10-CM | POA: Diagnosis not present

## 2021-07-13 DIAGNOSIS — Z992 Dependence on renal dialysis: Secondary | ICD-10-CM | POA: Diagnosis not present

## 2021-07-13 DIAGNOSIS — N186 End stage renal disease: Secondary | ICD-10-CM | POA: Diagnosis not present

## 2021-07-14 DIAGNOSIS — N186 End stage renal disease: Secondary | ICD-10-CM | POA: Diagnosis not present

## 2021-07-14 DIAGNOSIS — Z992 Dependence on renal dialysis: Secondary | ICD-10-CM | POA: Diagnosis not present

## 2021-07-16 DIAGNOSIS — Z992 Dependence on renal dialysis: Secondary | ICD-10-CM | POA: Diagnosis not present

## 2021-07-16 DIAGNOSIS — N186 End stage renal disease: Secondary | ICD-10-CM | POA: Diagnosis not present

## 2021-07-20 DIAGNOSIS — N186 End stage renal disease: Secondary | ICD-10-CM | POA: Diagnosis not present

## 2021-07-20 DIAGNOSIS — Z992 Dependence on renal dialysis: Secondary | ICD-10-CM | POA: Diagnosis not present

## 2021-07-21 DIAGNOSIS — Z992 Dependence on renal dialysis: Secondary | ICD-10-CM | POA: Diagnosis not present

## 2021-07-21 DIAGNOSIS — N186 End stage renal disease: Secondary | ICD-10-CM | POA: Diagnosis not present

## 2021-07-23 DIAGNOSIS — N186 End stage renal disease: Secondary | ICD-10-CM | POA: Diagnosis not present

## 2021-07-23 DIAGNOSIS — Z992 Dependence on renal dialysis: Secondary | ICD-10-CM | POA: Diagnosis not present

## 2021-07-24 DIAGNOSIS — N186 End stage renal disease: Secondary | ICD-10-CM | POA: Diagnosis not present

## 2021-07-24 DIAGNOSIS — Z992 Dependence on renal dialysis: Secondary | ICD-10-CM | POA: Diagnosis not present

## 2021-07-28 DIAGNOSIS — N186 End stage renal disease: Secondary | ICD-10-CM | POA: Diagnosis not present

## 2021-07-28 DIAGNOSIS — Z992 Dependence on renal dialysis: Secondary | ICD-10-CM | POA: Diagnosis not present

## 2021-07-30 DIAGNOSIS — Z992 Dependence on renal dialysis: Secondary | ICD-10-CM | POA: Diagnosis not present

## 2021-07-30 DIAGNOSIS — N186 End stage renal disease: Secondary | ICD-10-CM | POA: Diagnosis not present

## 2021-07-31 DIAGNOSIS — Z992 Dependence on renal dialysis: Secondary | ICD-10-CM | POA: Diagnosis not present

## 2021-07-31 DIAGNOSIS — N186 End stage renal disease: Secondary | ICD-10-CM | POA: Diagnosis not present

## 2021-08-03 DIAGNOSIS — N186 End stage renal disease: Secondary | ICD-10-CM | POA: Diagnosis not present

## 2021-08-03 DIAGNOSIS — Z992 Dependence on renal dialysis: Secondary | ICD-10-CM | POA: Diagnosis not present

## 2021-08-04 DIAGNOSIS — N186 End stage renal disease: Secondary | ICD-10-CM | POA: Diagnosis not present

## 2021-08-04 DIAGNOSIS — Z992 Dependence on renal dialysis: Secondary | ICD-10-CM | POA: Diagnosis not present

## 2021-08-05 DIAGNOSIS — Z992 Dependence on renal dialysis: Secondary | ICD-10-CM | POA: Diagnosis not present

## 2021-08-05 DIAGNOSIS — I129 Hypertensive chronic kidney disease with stage 1 through stage 4 chronic kidney disease, or unspecified chronic kidney disease: Secondary | ICD-10-CM | POA: Diagnosis not present

## 2021-08-05 DIAGNOSIS — N186 End stage renal disease: Secondary | ICD-10-CM | POA: Diagnosis not present

## 2021-08-06 DIAGNOSIS — Z992 Dependence on renal dialysis: Secondary | ICD-10-CM | POA: Diagnosis not present

## 2021-08-06 DIAGNOSIS — N186 End stage renal disease: Secondary | ICD-10-CM | POA: Diagnosis not present

## 2021-08-07 DIAGNOSIS — Z992 Dependence on renal dialysis: Secondary | ICD-10-CM | POA: Diagnosis not present

## 2021-08-07 DIAGNOSIS — N186 End stage renal disease: Secondary | ICD-10-CM | POA: Diagnosis not present

## 2021-08-10 DIAGNOSIS — N186 End stage renal disease: Secondary | ICD-10-CM | POA: Diagnosis not present

## 2021-08-10 DIAGNOSIS — Z992 Dependence on renal dialysis: Secondary | ICD-10-CM | POA: Diagnosis not present

## 2021-08-11 DIAGNOSIS — Z992 Dependence on renal dialysis: Secondary | ICD-10-CM | POA: Diagnosis not present

## 2021-08-11 DIAGNOSIS — N186 End stage renal disease: Secondary | ICD-10-CM | POA: Diagnosis not present

## 2021-08-13 DIAGNOSIS — Z992 Dependence on renal dialysis: Secondary | ICD-10-CM | POA: Diagnosis not present

## 2021-08-13 DIAGNOSIS — N186 End stage renal disease: Secondary | ICD-10-CM | POA: Diagnosis not present

## 2021-08-13 DIAGNOSIS — I10 Essential (primary) hypertension: Secondary | ICD-10-CM | POA: Diagnosis not present

## 2021-08-17 DIAGNOSIS — N186 End stage renal disease: Secondary | ICD-10-CM | POA: Diagnosis not present

## 2021-08-17 DIAGNOSIS — Z992 Dependence on renal dialysis: Secondary | ICD-10-CM | POA: Diagnosis not present

## 2021-08-18 DIAGNOSIS — Z992 Dependence on renal dialysis: Secondary | ICD-10-CM | POA: Diagnosis not present

## 2021-08-18 DIAGNOSIS — N186 End stage renal disease: Secondary | ICD-10-CM | POA: Diagnosis not present

## 2021-08-20 DIAGNOSIS — N186 End stage renal disease: Secondary | ICD-10-CM | POA: Diagnosis not present

## 2021-08-20 DIAGNOSIS — Z992 Dependence on renal dialysis: Secondary | ICD-10-CM | POA: Diagnosis not present

## 2021-08-21 DIAGNOSIS — N186 End stage renal disease: Secondary | ICD-10-CM | POA: Diagnosis not present

## 2021-08-21 DIAGNOSIS — Z992 Dependence on renal dialysis: Secondary | ICD-10-CM | POA: Diagnosis not present

## 2021-08-24 DIAGNOSIS — Z992 Dependence on renal dialysis: Secondary | ICD-10-CM | POA: Diagnosis not present

## 2021-08-24 DIAGNOSIS — N2581 Secondary hyperparathyroidism of renal origin: Secondary | ICD-10-CM | POA: Diagnosis not present

## 2021-08-24 DIAGNOSIS — N186 End stage renal disease: Secondary | ICD-10-CM | POA: Diagnosis not present

## 2021-08-25 DIAGNOSIS — Z992 Dependence on renal dialysis: Secondary | ICD-10-CM | POA: Diagnosis not present

## 2021-08-25 DIAGNOSIS — N186 End stage renal disease: Secondary | ICD-10-CM | POA: Diagnosis not present

## 2021-08-25 DIAGNOSIS — N2581 Secondary hyperparathyroidism of renal origin: Secondary | ICD-10-CM | POA: Diagnosis not present

## 2021-08-26 DIAGNOSIS — M79671 Pain in right foot: Secondary | ICD-10-CM | POA: Diagnosis not present

## 2021-08-27 DIAGNOSIS — N2581 Secondary hyperparathyroidism of renal origin: Secondary | ICD-10-CM | POA: Diagnosis not present

## 2021-08-27 DIAGNOSIS — N186 End stage renal disease: Secondary | ICD-10-CM | POA: Diagnosis not present

## 2021-08-27 DIAGNOSIS — Z992 Dependence on renal dialysis: Secondary | ICD-10-CM | POA: Diagnosis not present

## 2021-08-28 ENCOUNTER — Other Ambulatory Visit: Payer: Self-pay | Admitting: *Deleted

## 2021-08-28 DIAGNOSIS — N2581 Secondary hyperparathyroidism of renal origin: Secondary | ICD-10-CM | POA: Diagnosis not present

## 2021-08-28 DIAGNOSIS — Z992 Dependence on renal dialysis: Secondary | ICD-10-CM | POA: Diagnosis not present

## 2021-08-28 DIAGNOSIS — N186 End stage renal disease: Secondary | ICD-10-CM | POA: Diagnosis not present

## 2021-08-28 DIAGNOSIS — N184 Chronic kidney disease, stage 4 (severe): Secondary | ICD-10-CM

## 2021-08-31 DIAGNOSIS — N186 End stage renal disease: Secondary | ICD-10-CM | POA: Diagnosis not present

## 2021-08-31 DIAGNOSIS — N2581 Secondary hyperparathyroidism of renal origin: Secondary | ICD-10-CM | POA: Diagnosis not present

## 2021-08-31 DIAGNOSIS — Z992 Dependence on renal dialysis: Secondary | ICD-10-CM | POA: Diagnosis not present

## 2021-09-01 DIAGNOSIS — N2581 Secondary hyperparathyroidism of renal origin: Secondary | ICD-10-CM | POA: Diagnosis not present

## 2021-09-01 DIAGNOSIS — Z992 Dependence on renal dialysis: Secondary | ICD-10-CM | POA: Diagnosis not present

## 2021-09-01 DIAGNOSIS — N186 End stage renal disease: Secondary | ICD-10-CM | POA: Diagnosis not present

## 2021-09-02 ENCOUNTER — Encounter (HOSPITAL_COMMUNITY): Payer: Medicare HMO

## 2021-09-02 ENCOUNTER — Ambulatory Visit: Payer: Medicare HMO | Admitting: Vascular Surgery

## 2021-09-02 ENCOUNTER — Other Ambulatory Visit (HOSPITAL_COMMUNITY): Payer: Medicare HMO

## 2021-09-03 DIAGNOSIS — N2581 Secondary hyperparathyroidism of renal origin: Secondary | ICD-10-CM | POA: Diagnosis not present

## 2021-09-03 DIAGNOSIS — N186 End stage renal disease: Secondary | ICD-10-CM | POA: Diagnosis not present

## 2021-09-03 DIAGNOSIS — Z992 Dependence on renal dialysis: Secondary | ICD-10-CM | POA: Diagnosis not present

## 2021-09-04 DIAGNOSIS — N186 End stage renal disease: Secondary | ICD-10-CM | POA: Diagnosis not present

## 2021-09-04 DIAGNOSIS — I129 Hypertensive chronic kidney disease with stage 1 through stage 4 chronic kidney disease, or unspecified chronic kidney disease: Secondary | ICD-10-CM | POA: Diagnosis not present

## 2021-09-04 DIAGNOSIS — Z992 Dependence on renal dialysis: Secondary | ICD-10-CM | POA: Diagnosis not present

## 2021-09-07 DIAGNOSIS — N2581 Secondary hyperparathyroidism of renal origin: Secondary | ICD-10-CM | POA: Diagnosis not present

## 2021-09-07 DIAGNOSIS — N186 End stage renal disease: Secondary | ICD-10-CM | POA: Diagnosis not present

## 2021-09-07 DIAGNOSIS — Z992 Dependence on renal dialysis: Secondary | ICD-10-CM | POA: Diagnosis not present

## 2021-09-08 DIAGNOSIS — N2581 Secondary hyperparathyroidism of renal origin: Secondary | ICD-10-CM | POA: Diagnosis not present

## 2021-09-08 DIAGNOSIS — Z992 Dependence on renal dialysis: Secondary | ICD-10-CM | POA: Diagnosis not present

## 2021-09-08 DIAGNOSIS — N186 End stage renal disease: Secondary | ICD-10-CM | POA: Diagnosis not present

## 2021-09-09 ENCOUNTER — Other Ambulatory Visit: Payer: Self-pay

## 2021-09-09 ENCOUNTER — Encounter (HOSPITAL_COMMUNITY): Payer: Self-pay

## 2021-09-09 ENCOUNTER — Emergency Department (HOSPITAL_COMMUNITY)
Admission: EM | Admit: 2021-09-09 | Discharge: 2021-09-10 | Disposition: A | Discharge status: Home / Self Care | Payer: Medicare HMO | Attending: Emergency Medicine | Admitting: Emergency Medicine

## 2021-09-09 ENCOUNTER — Emergency Department (HOSPITAL_COMMUNITY): Payer: Medicare HMO

## 2021-09-09 DIAGNOSIS — I120 Hypertensive chronic kidney disease with stage 5 chronic kidney disease or end stage renal disease: Secondary | ICD-10-CM | POA: Insufficient documentation

## 2021-09-09 DIAGNOSIS — Z79899 Other long term (current) drug therapy: Secondary | ICD-10-CM | POA: Diagnosis not present

## 2021-09-09 DIAGNOSIS — M1990 Unspecified osteoarthritis, unspecified site: Secondary | ICD-10-CM | POA: Insufficient documentation

## 2021-09-09 DIAGNOSIS — J45909 Unspecified asthma, uncomplicated: Secondary | ICD-10-CM | POA: Insufficient documentation

## 2021-09-09 DIAGNOSIS — M1711 Unilateral primary osteoarthritis, right knee: Secondary | ICD-10-CM | POA: Diagnosis not present

## 2021-09-09 DIAGNOSIS — N186 End stage renal disease: Secondary | ICD-10-CM | POA: Diagnosis not present

## 2021-09-09 DIAGNOSIS — Z7951 Long term (current) use of inhaled steroids: Secondary | ICD-10-CM | POA: Insufficient documentation

## 2021-09-09 DIAGNOSIS — M25561 Pain in right knee: Secondary | ICD-10-CM | POA: Diagnosis not present

## 2021-09-09 DIAGNOSIS — Z8616 Personal history of COVID-19: Secondary | ICD-10-CM | POA: Insufficient documentation

## 2021-09-09 DIAGNOSIS — M1710 Unilateral primary osteoarthritis, unspecified knee: Secondary | ICD-10-CM

## 2021-09-09 LAB — CBC WITH DIFFERENTIAL/PLATELET
Abs Immature Granulocytes: 0.02 10*3/uL (ref 0.00–0.07)
Basophils Absolute: 0 10*3/uL (ref 0.0–0.1)
Basophils Relative: 0 %
Eosinophils Absolute: 0.1 10*3/uL (ref 0.0–0.5)
Eosinophils Relative: 2 %
HCT: 37.7 % (ref 36.0–46.0)
Hemoglobin: 11.7 g/dL — ABNORMAL LOW (ref 12.0–15.0)
Immature Granulocytes: 0 %
Lymphocytes Relative: 19 %
Lymphs Abs: 1.1 10*3/uL (ref 0.7–4.0)
MCH: 31.9 pg (ref 26.0–34.0)
MCHC: 31 g/dL (ref 30.0–36.0)
MCV: 102.7 fL — ABNORMAL HIGH (ref 80.0–100.0)
Monocytes Absolute: 0.9 10*3/uL (ref 0.1–1.0)
Monocytes Relative: 15 %
Neutro Abs: 3.8 10*3/uL (ref 1.7–7.7)
Neutrophils Relative %: 64 %
Platelets: 125 10*3/uL — ABNORMAL LOW (ref 150–400)
RBC: 3.67 MIL/uL — ABNORMAL LOW (ref 3.87–5.11)
RDW: 18.2 % — ABNORMAL HIGH (ref 11.5–15.5)
WBC: 5.9 10*3/uL (ref 4.0–10.5)
nRBC: 0 % (ref 0.0–0.2)

## 2021-09-09 LAB — COMPREHENSIVE METABOLIC PANEL
ALT: 9 U/L (ref 0–44)
AST: 13 U/L — ABNORMAL LOW (ref 15–41)
Albumin: 3.3 g/dL — ABNORMAL LOW (ref 3.5–5.0)
Alkaline Phosphatase: 81 U/L (ref 38–126)
Anion gap: 8 (ref 5–15)
BUN: 24 mg/dL — ABNORMAL HIGH (ref 8–23)
CO2: 28 mmol/L (ref 22–32)
Calcium: 9.6 mg/dL (ref 8.9–10.3)
Chloride: 98 mmol/L (ref 98–111)
Creatinine, Ser: 5.27 mg/dL — ABNORMAL HIGH (ref 0.44–1.00)
GFR, Estimated: 8 mL/min — ABNORMAL LOW (ref >60)
Glucose, Bld: 111 mg/dL — ABNORMAL HIGH (ref 70–99)
Potassium: 3.9 mmol/L (ref 3.5–5.1)
Sodium: 134 mmol/L — ABNORMAL LOW (ref 135–145)
Total Bilirubin: 0.5 mg/dL (ref 0.3–1.2)
Total Protein: 6.9 g/dL (ref 6.5–8.1)

## 2021-09-09 NOTE — ED Triage Notes (Signed)
Pt complains of right knee pain x 3 days. Pt states that she did not fall or hit her knee and that it just became sore.

## 2021-09-09 NOTE — ED Provider Notes (Signed)
Emergency Medicine Provider Triage Evaluation Note  Diamond [name] , a 70 y.o. female  was evaluated in triage.  Pt complains of atraumatic right knee pain that started about 2 days ago.  Symptoms have been progressively worsening.  Reports difficulty with range of motion as well as ambulation due to her pain.  States she is a dialysis patient.  Denies any fevers.  Notes an episode of vomiting last night but denies any persistent vomiting or nausea.  Physical Exam  BP (!) 169/113 (BP Location: Left Arm)   Pulse 94   Temp 99.1 F (37.3 C) (Oral)   Resp 18   SpO2 99%  Gen:   Awake, no distress   Resp:  Normal effort  MSK:   Moves extremities without difficulty  Other:  Moderate tenderness noted along the medial joint line of the right knee.  Mild increased warmth in the region.  Difficult to assess range of motion due to patient's pain.   Medical Decision Making  Medically screening exam initiated at 7:52 PM.  Appropriate orders placed.  CORLIS ANGELICA was informed that the remainder of the evaluation will be completed by another provider, this initial triage assessment does not replace that evaluation, and the importance of remaining in the ED until their evaluation is complete.   Rayna Sexton, PA-C 09/09/21 1953    Luna Fuse, MD 09/11/21 [phone]

## 2021-09-09 NOTE — ED Notes (Signed)
Save blue in main lab 

## 2021-09-10 MED ORDER — TRAMADOL HCL 50 MG PO TABS
50.0000 mg | ORAL_TABLET | Freq: Once | ORAL | Status: AC
  Administered 2021-09-10: 50 mg via ORAL
  Filled 2021-09-10: qty 1

## 2021-09-10 MED ORDER — TRAMADOL HCL 50 MG PO TABS: 50.0000 mg | ORAL_TABLET | Freq: Four times a day (QID) | ORAL | 0 refills | Status: DC | PRN

## 2021-09-10 NOTE — ED Provider Notes (Signed)
[facility] DEPT Provider Note   CSN: [id number] Arrival date & time: 09/09/21  1843     History Chief Complaint  Patient presents with   Knee Pain    Diamond [name] is a 70 y.o. female hx of ESRD on HD (Last HD was yesterday), R knee pain.  Has right knee pain for the last 3 days.  Patient states that she denies any trauma or injury.  Denies any fevers or chills.  Patient states that walking makes it worse.  Denies any chest pain or shortness of breath  The history is provided by the patient.      Past Medical History:  Diagnosis Date   Asthma    Chronic kidney disease    Gout    Hypertension     Patient Active Problem List   Diagnosis Date Noted   Vitamin D deficiency 01/02/2021   Hyperparathyroidism ([place]) 12/31/2020   CKD (chronic kidney disease), stage III ([place]) 11/10/2019   Cardiomegaly 11/05/2019   Hyperkalemia 11/05/2019   Hypokalemia 10/18/2019   Pneumonia due to COVID-19 virus 10/17/2019   CAP (community acquired pneumonia) 10/16/2019   Hypertension    Asthma    Gout    AKI (acute kidney injury) ([place])    Hyponatremia    Anemia of chronic disease     Past Surgical History:  Procedure Laterality Date   CESAREAN SECTION  1989     OB History   No obstetric history on file.     Family History  Problem Relation Age of Onset   Hypertension Mother    Hypertension Father    Colon cancer Brother    Lung cancer Brother     Social History   Tobacco Use   Smoking status: Never   Smokeless tobacco: Never  Vaping Use   Vaping Use: Never used  Substance Use Topics   Alcohol use: No   Drug use: No    Home Medications Prior to Admission medications   Medication Sig Start Date End Date Taking? Authorizing Provider  albuterol (VENTOLIN HFA) 108 (90 Base) MCG/ACT inhaler Inhale 2 puffs into the lungs every 4 (four) hours as needed for wheezing or shortness of breath. 10/08/19   [provider]  atenolol  (TENORMIN) 25 MG tablet Take 25 mg by mouth daily.    [provider]  budesonide-formoterol (SYMBICORT) 160-4.5 MCG/ACT inhaler Inhale 2 puffs into the lungs 2 (two) times daily.    [provider]  montelukast (SINGULAIR) 10 MG tablet Take 10 mg by mouth at bedtime. Patient not taking: Reported on 12/31/2020    [provider]  predniSONE (STERAPRED UNI-PAK 21 TAB) 10 MG (21) TBPK tablet Take by mouth daily. Take 6 tabs by mouth daily  for 2 days, then 5 tabs for 2 days, then 4 tabs for 2 days, then 3 tabs for 2 days, 2 tabs for 2 days, then 1 tab by mouth daily for 2 days 01/16/21   Hughie Closs, PA-C  traMADol (ULTRAM) 50 MG tablet Take 1-2 tablets (50-100 mg total) by mouth every 12 (twelve) hours as needed for up to 10 doses for moderate pain or severe pain. 05/10/20   Scot Jun, FNP  verapamil (CALAN) 120 MG tablet Take 120 mg by mouth 3 (three) times daily.    [provider]    Allergies    Patient has no known allergies.  Review of Systems   Review of Systems  Musculoskeletal:  R knee pain   All other systems reviewed and are negative.  Physical Exam Updated Vital Signs BP (!) 138/104   Pulse 94   Temp 99.1 F (37.3 C) (Oral)   Resp 18   SpO2 100%   Physical Exam Vitals and nursing note reviewed.  Constitutional:      Appearance: Normal appearance.  HENT:     Head: Normocephalic.     Nose: Nose normal.     Mouth/Throat:     Mouth: Mucous membranes are moist.  Eyes:     Extraocular Movements: Extraocular movements intact.     Pupils: Pupils are equal, round, and reactive to light.  Cardiovascular:     Rate and Rhythm: Normal rate and regular rhythm.     Pulses: Normal pulses.     Heart sounds: Normal heart sounds.  Pulmonary:     Effort: Pulmonary effort is normal.     Breath sounds: Normal breath sounds.  Abdominal:     General: Abdomen is flat.     Palpations: Abdomen is soft.  Musculoskeletal:      Cervical back: Normal range of motion and neck supple.     Comments:  There is right knee effusion, able to range the knee.  The knee is not red or hot and there is no cellulitis.  Neurovascular intact in the right lower extremity  Neurological:     Mental Status: She is alert.    ED Results / Procedures / Treatments   Labs (all labs ordered are listed, but only abnormal results are displayed) Labs Reviewed  COMPREHENSIVE METABOLIC PANEL - Abnormal; Notable for the following components:      Result Value   Sodium 134 (*)    Glucose, Bld 111 (*)    BUN 24 (*)    Creatinine, Ser 5.27 (*)    Albumin 3.3 (*)    AST 13 (*)    GFR, Estimated 8 (*)    All other components within normal limits  CBC WITH DIFFERENTIAL/PLATELET - Abnormal; Notable for the following components:   RBC 3.67 (*)    Hemoglobin 11.7 (*)    MCV 102.7 (*)    RDW 18.2 (*)    Platelets 125 (*)    All other components within normal limits    EKG None  Radiology DG Knee Complete 4 Views Right  Result Date: 09/09/2021 CLINICAL DATA:  Atraumatic right knee pain x3 days. EXAM: RIGHT KNEE - COMPLETE 4+ VIEW COMPARISON:  None. FINDINGS: No evidence of an acute fracture or dislocation. Mild to moderate severity medial and lateral tibiofemoral compartment space narrowing is seen. Mild medial and lateral chondrocalcinosis is present. A tiny joint effusion is seen. IMPRESSION: 1. Mild to moderate severity osteoarthritis of the right knee. 2. No acute osseous abnormality. Electronically Signed   By: Virgina Norfolk M.D.   On: 09/09/2021 22:09    Procedures Procedures   Medications Ordered in ED Medications  traMADol (ULTRAM) tablet 50 mg (has no administration in time range)    ED Course  I have reviewed the triage vital signs and the nursing notes.  Pertinent labs & imaging results that were available during my care of the patient were reviewed by me and considered in my medical decision making (see chart for  details).    MDM Rules/Calculators/A&P                           Diamond [name] is a 70  y.o. female here presenting with right knee pain.  Patient has no trauma or injury.  Patient is a dialysis patient and her creatinine is baseline and her potassium is normal.  Her x-ray showed arthritis of the right knee.  I think she likely has a small effusion from arthritis. Patient has no signs of septic joint.  Will give tramadol as needed for pain.  We will have her follow-up with orthopedic doctor outpatient.   Final Clinical Impression(s) / ED Diagnoses Final diagnoses:  None    Rx / DC Orders ED Discharge Orders     None        Drenda Freeze, MD 09/10/21 [phone]

## 2021-09-10 NOTE — Discharge Instructions (Addendum)
You have arthritis of the knee.  Take Tylenol for pain and take tramadol for severe pain  Please Ace wrap the knee to help with swelling.  Keep the knee elevated  Follow-up with orthopedic doctor.    Return to ER if you have worsening swelling, severe pain, unable to walk

## 2021-09-12 DIAGNOSIS — N186 End stage renal disease: Secondary | ICD-10-CM | POA: Diagnosis not present

## 2021-09-12 DIAGNOSIS — Z992 Dependence on renal dialysis: Secondary | ICD-10-CM | POA: Diagnosis not present

## 2021-09-12 DIAGNOSIS — N2581 Secondary hyperparathyroidism of renal origin: Secondary | ICD-10-CM | POA: Diagnosis not present

## 2021-09-14 DIAGNOSIS — E46 Unspecified protein-calorie malnutrition: Secondary | ICD-10-CM | POA: Insufficient documentation

## 2021-09-14 DIAGNOSIS — D689 Coagulation defect, unspecified: Secondary | ICD-10-CM | POA: Insufficient documentation

## 2021-09-14 DIAGNOSIS — L299 Pruritus, unspecified: Secondary | ICD-10-CM | POA: Insufficient documentation

## 2021-09-14 DIAGNOSIS — N186 End stage renal disease: Secondary | ICD-10-CM | POA: Insufficient documentation

## 2021-09-14 DIAGNOSIS — N2581 Secondary hyperparathyroidism of renal origin: Secondary | ICD-10-CM | POA: Insufficient documentation

## 2021-09-14 DIAGNOSIS — T829XXA Unspecified complication of cardiac and vascular prosthetic device, implant and graft, initial encounter: Secondary | ICD-10-CM | POA: Insufficient documentation

## 2021-09-14 DIAGNOSIS — D509 Iron deficiency anemia, unspecified: Secondary | ICD-10-CM | POA: Insufficient documentation

## 2021-09-14 DIAGNOSIS — T7840XA Allergy, unspecified, initial encounter: Secondary | ICD-10-CM | POA: Insufficient documentation

## 2021-09-14 DIAGNOSIS — R52 Pain, unspecified: Secondary | ICD-10-CM | POA: Insufficient documentation

## 2021-09-14 DIAGNOSIS — R0602 Shortness of breath: Secondary | ICD-10-CM | POA: Insufficient documentation

## 2021-09-14 DIAGNOSIS — M10371 Gout due to renal impairment, right ankle and foot: Secondary | ICD-10-CM | POA: Insufficient documentation

## 2021-09-15 DIAGNOSIS — Z992 Dependence on renal dialysis: Secondary | ICD-10-CM | POA: Diagnosis not present

## 2021-09-15 DIAGNOSIS — N2581 Secondary hyperparathyroidism of renal origin: Secondary | ICD-10-CM | POA: Diagnosis not present

## 2021-09-15 DIAGNOSIS — N186 End stage renal disease: Secondary | ICD-10-CM | POA: Diagnosis not present

## 2021-09-16 ENCOUNTER — Ambulatory Visit: Payer: Medicare HMO | Admitting: Vascular Surgery

## 2021-09-16 ENCOUNTER — Other Ambulatory Visit (HOSPITAL_COMMUNITY): Payer: Medicare HMO

## 2021-09-16 ENCOUNTER — Inpatient Hospital Stay (HOSPITAL_COMMUNITY): Admission: RE | Admit: 2021-09-16 | Payer: Medicare HMO | Source: Ambulatory Visit

## 2021-09-17 DIAGNOSIS — N2581 Secondary hyperparathyroidism of renal origin: Secondary | ICD-10-CM | POA: Diagnosis not present

## 2021-09-17 DIAGNOSIS — Z992 Dependence on renal dialysis: Secondary | ICD-10-CM | POA: Diagnosis not present

## 2021-09-17 DIAGNOSIS — N186 End stage renal disease: Secondary | ICD-10-CM | POA: Diagnosis not present

## 2021-09-19 DIAGNOSIS — N186 End stage renal disease: Secondary | ICD-10-CM | POA: Diagnosis not present

## 2021-09-19 DIAGNOSIS — N2581 Secondary hyperparathyroidism of renal origin: Secondary | ICD-10-CM | POA: Diagnosis not present

## 2021-09-19 DIAGNOSIS — Z992 Dependence on renal dialysis: Secondary | ICD-10-CM | POA: Diagnosis not present

## 2021-09-22 DIAGNOSIS — N186 End stage renal disease: Secondary | ICD-10-CM | POA: Diagnosis not present

## 2021-09-22 DIAGNOSIS — Z992 Dependence on renal dialysis: Secondary | ICD-10-CM | POA: Diagnosis not present

## 2021-09-22 DIAGNOSIS — N2581 Secondary hyperparathyroidism of renal origin: Secondary | ICD-10-CM | POA: Diagnosis not present

## 2021-09-23 DIAGNOSIS — J4530 Mild persistent asthma, uncomplicated: Secondary | ICD-10-CM | POA: Diagnosis not present

## 2021-09-23 DIAGNOSIS — I10 Essential (primary) hypertension: Secondary | ICD-10-CM | POA: Diagnosis not present

## 2021-09-23 DIAGNOSIS — Z2821 Immunization not carried out because of patient refusal: Secondary | ICD-10-CM | POA: Diagnosis not present

## 2021-09-23 DIAGNOSIS — Z0000 Encounter for general adult medical examination without abnormal findings: Secondary | ICD-10-CM | POA: Diagnosis not present

## 2021-09-24 NOTE — Progress Notes (Signed)
[facility] [place]  ASSESSMENT / PLAN: Diamond [name] is a 70 y.o. right handed female in need of permanent hemodialysis access. I reviewed options for dialysis in detail with the patient. I counseled the patient that dialysis access requires surveillance and periodic maintenance. Plan to proceed with left first stage brachiobasilic arteriovenous fistula creation as soon as schedule allows.   CHIEF COMPLAINT: ESRD in need of HD access  HISTORY OF PRESENT ILLNESS: Diamond [name] is a 70 y.o. female referred to clinic for dialysis access creation.  The patient has a right internal jugular tunneled dialysis catheter with which she dialyzes Tuesday, Thursday, Saturday.  She has not had previous dialysis access surgery before.  She is right-handed.  She denies any history of central venous catheterization, port placement or pacemaker placement.  She has never had trauma or upper extremity surgery.   Past Medical History:  Diagnosis Date   Asthma    Chronic kidney disease    Gout    Hypertension     Past Surgical History:  Procedure Laterality Date   CESAREAN SECTION  1989    Family History  Problem Relation Age of Onset   Hypertension Mother    Hypertension Father    Colon cancer Brother    Lung cancer Brother     Social History   Socioeconomic History   Marital status: Widowed    Spouse name: Not on file   Number of children: Not on file   Years of education: Not on file   Highest education level: Not on file  Occupational History   Not on file  Tobacco Use   Smoking status: Never   Smokeless tobacco: Never  Vaping Use   Vaping Use: Never used  Substance and Sexual Activity   Alcohol use: No   Drug use: No   Sexual activity: Not on file  Other Topics Concern   Not on file  Social History Narrative   Not on file   Social Determinants of Health   Financial Resource Strain: Not on file  Food Insecurity: Not on file  Transportation Needs:  Not on file  Physical Activity: Not on file  Stress: Not on file  Social Connections: Not on file  Intimate Partner Violence: Not on file    No Known Allergies  Current Outpatient Medications  Medication Sig Dispense Refill   albuterol (VENTOLIN HFA) 108 (90 Base) MCG/ACT inhaler Inhale 2 puffs into the lungs every 4 (four) hours as needed for wheezing or shortness of breath.     atenolol (TENORMIN) 25 MG tablet Take 25 mg by mouth daily.     budesonide-formoterol (SYMBICORT) 160-4.5 MCG/ACT inhaler Inhale 2 puffs into the lungs 2 (two) times daily.     montelukast (SINGULAIR) 10 MG tablet Take 10 mg by mouth at bedtime. (Patient not taking: No sig reported)     traMADol (ULTRAM) 50 MG tablet Take 1 tablet (50 mg total) by mouth every 6 (six) hours as needed. 10 tablet 0   verapamil (CALAN) 120 MG tablet Take 120 mg by mouth 3 (three) times daily.     No current facility-administered medications for this visit.    REVIEW OF SYSTEMS:  [X]  denotes positive finding, [ ]  denotes negative finding Cardiac  Comments:  Chest pain or chest pressure:    Shortness of breath upon exertion:    Short of breath when lying flat:    Irregular heart rhythm:        Vascular  Pain in calf, thigh, or hip brought on by ambulation:    Pain in feet at night that wakes you up from your sleep:     Blood clot in your veins:    Leg swelling:         Pulmonary    Oxygen at home:    Productive cough:     Wheezing:         Neurologic    Sudden weakness in arms or legs:     Sudden numbness in arms or legs:     Sudden onset of difficulty speaking or slurred speech:    Temporary loss of vision in one eye:     Problems with dizziness:         Gastrointestinal    Blood in stool:     Vomited blood:         Genitourinary    Burning when urinating:     Blood in urine:        Psychiatric    Major depression:         Hematologic    Bleeding problems:    Problems with blood clotting too easily:         Skin    Rashes or ulcers:        Constitutional    Fever or chills:      PHYSICAL EXAM Vitals:   09/25/21 1440  BP: (!) 174/110  Pulse: 100  Resp: 20  Temp: 98.1 F (36.7 C)  SpO2: 97%  Weight: 153 lb (69.4 kg)  Height: 5\' 7"  (1.702 m)    Constitutional: well appearing. no distress. Appears no nourished.  Neurologic: CN intact. no focal findings. no sensory loss. Psychiatric:  Mood and affect symmetric and appropriate. Eyes:  No icterus. No conjunctival pallor. Ears, nose, throat:  mucous membranes moist. Midline trachea.  Cardiac:  regular rate and rhythm.  Respiratory:  unlabored. Abdominal:  soft, non-tender, non-distended.  Peripheral vascular: 2+ radial and brachial pulses Extremity: no edema. no cyanosis. no pallor.  Skin: no gangrene. no ulceration.  Lymphatic: no Stemmer's sign. no palpable lymphadenopathy.  PERTINENT LABORATORY AND RADIOLOGIC DATA  Most recent CBC CBC Latest Ref Rng & Units 09/09/2021 06/26/2021 06/12/2021  WBC 4.0 - 10.5 K/uL 5.9 - -  Hemoglobin 12.0 - 15.0 g/dL 11.7(L) 7.5(L) 7.3(L)  Hematocrit 36.0 - 46.0 % 37.7 - -  Platelets 150 - 400 K/uL 125(L) - -     Most recent CMP CMP Latest Ref Rng & Units 09/09/2021 04/09/2021 12/31/2020  Glucose 70 - 99 mg/dL 111(H) 113(H) 97  BUN 8 - 23 mg/dL 24(H) 67(H) 82(H)  Creatinine 0.44 - 1.00 mg/dL 5.27(H) 7.39(H) 5.76(H)  Sodium 135 - 145 mmol/L 134(L) 135 142  Potassium 3.5 - 5.1 mmol/L 3.9 3.3(L) 3.5  Chloride 98 - 111 mmol/L 98 102 108  CO2 22 - 32 mmol/L 28 20(L) 23  Calcium 8.9 - 10.3 mg/dL 9.6 10.4(H) 10.3  Total Protein 6.5 - 8.1 g/dL 6.9 7.3 -  Total Bilirubin 0.3 - 1.2 mg/dL 0.5 0.6 -  Alkaline Phos 38 - 126 U/L 81 44 -  AST 15 - 41 U/L 13(L) 12(L) -  ALT 0 - 44 U/L 9 10 -    Renal function Estimated Creatinine Clearance: 9.5 mL/min (A) (by C-G formula based on SCr of 5.27 mg/dL (H)).  Hgb A1c MFr Bld (%)  Date Value  10/18/2019 5.4    LDL Cholesterol  Date Value Ref Range  Status  02/23/2011  80 0 - 99 mg/dL Final    Comment:    See lab report for associated comment(s)    [place]   Patient Name:  Diamond [name]  Date of Exam:   09/25/2021  Medical Rec #: [id number]       Accession #:    [id number]  Date of Birth: 1951/05/26      Patient Gender: F  Patient Age:   70 years  Exam Location:  [facility]  Procedure:      VAS [place] UPPER EXT VEIN MAPPING (PRE-OP AVF)  Referring Phys: Servando Snare    ---------------------------------------------------------------------------  -----     Indications: Pre-access.   History: CKD III.   Performing Technologist: Ronal Fear RVS, RCS      Examination Guidelines: A complete evaluation includes B-mode imaging,  spectral  Doppler, color Doppler, and power Doppler as needed of all accessible  portions  of each vessel. Bilateral testing is considered an integral part of a  complete  examination. Limited examinations for reoccurring indications may be  performed  as noted.   +-----------------+-------------+----------+--------+  Right Cephalic   Diameter (cm)Depth (cm)Findings  +-----------------+-------------+----------+--------+  Shoulder             0.14                         +-----------------+-------------+----------+--------+  Prox upper arm       0.13                         +-----------------+-------------+----------+--------+  Mid upper arm        0.18                         +-----------------+-------------+----------+--------+  Dist upper arm       0.13                         +-----------------+-------------+----------+--------+  Antecubital fossa    0.14                         +-----------------+-------------+----------+--------+  Prox forearm         0.17                         +-----------------+-------------+----------+--------+  Mid forearm          0.15                          +-----------------+-------------+----------+--------+  Dist forearm         0.15                         +-----------------+-------------+----------+--------+   +-----------------+-------------+----------+--------+  Right Basilic    Diameter (cm)Depth (cm)Findings  +-----------------+-------------+----------+--------+  Prox upper arm       0.25                         +-----------------+-------------+----------+--------+  Mid upper arm        0.24                         +-----------------+-------------+----------+--------+  Dist upper arm       0.23                         +-----------------+-------------+----------+--------+  Antecubital fossa    0.35                         +-----------------+-------------+----------+--------+  Prox forearm         0.13                         +-----------------+-------------+----------+--------+   +-----------------+-------------+----------+--------+  Left Cephalic    Diameter (cm)Depth (cm)Findings  +-----------------+-------------+----------+--------+  Shoulder             0.21                         +-----------------+-------------+----------+--------+  Prox upper arm       0.21                         +-----------------+-------------+----------+--------+  Mid upper arm        0.32               tortuous  +-----------------+-------------+----------+--------+  Dist upper arm       0.28                         +-----------------+-------------+----------+--------+  Antecubital fossa    0.25                         +-----------------+-------------+----------+--------+  Prox forearm         0.20                         +-----------------+-------------+----------+--------+  Mid forearm          0.21               tortuous  +-----------------+-------------+----------+--------+  Dist forearm         0.18                          +-----------------+-------------+----------+--------+   +-----------------+-------------+----------+--------+  Left Basilic     Diameter (cm)Depth (cm)Findings  +-----------------+-------------+----------+--------+  Prox upper arm       0.41                         +-----------------+-------------+----------+--------+  Mid upper arm        0.40                         +-----------------+-------------+----------+--------+  Dist upper arm       0.43                         +-----------------+-------------+----------+--------+  Antecubital fossa    0.43                         +-----------------+-------------+----------+--------+  Prox forearm         0.33                         +-----------------+-------------+----------+--------+   Summary: Right: Patent and compressible cephalic and basilic veins.  Left: Patent and compressible cephalic and basilic veins.        The cephalic vein is mildly tortuous in the upper and lower        arm.   *  See table(s) above for measurements and observations.     Diagnosing physician: Cyndia Diver. Stanford Breed, MD [facility] Phone Number: [phone] 09/25/2021 4:47 PM  Total time spent on preparing this encounter including chart review, data review, collecting history, examining the patient, coordinating care for this new patient, 60 minutes.  Portions of this report may have been transcribed using voice recognition software.  Every effort has been made to ensure accuracy; however, inadvertent computerized transcription errors may still be present.

## 2021-09-25 ENCOUNTER — Ambulatory Visit (INDEPENDENT_AMBULATORY_CARE_PROVIDER_SITE_OTHER)
Admission: RE | Admit: 2021-09-25 | Discharge: 2021-09-25 | Disposition: A | Discharge status: Home / Self Care | Payer: Medicare HMO | Source: Ambulatory Visit | Attending: Vascular Surgery | Admitting: Vascular Surgery

## 2021-09-25 ENCOUNTER — Ambulatory Visit (INDEPENDENT_AMBULATORY_CARE_PROVIDER_SITE_OTHER): Payer: Medicare HMO | Admitting: Vascular Surgery

## 2021-09-25 ENCOUNTER — Ambulatory Visit (HOSPITAL_COMMUNITY)
Admission: RE | Admit: 2021-09-25 | Discharge: 2021-09-25 | Disposition: A | Discharge status: Home / Self Care | Payer: Medicare HMO | Source: Ambulatory Visit | Attending: Vascular Surgery | Admitting: Vascular Surgery

## 2021-09-25 ENCOUNTER — Encounter: Payer: Self-pay | Admitting: Vascular Surgery

## 2021-09-25 ENCOUNTER — Other Ambulatory Visit: Payer: Self-pay

## 2021-09-25 VITALS — BP 174/110 | HR 100 | Temp 98.1°F | Resp 20 | Ht 67.0 in | Wt 153.0 lb

## 2021-09-25 DIAGNOSIS — N184 Chronic kidney disease, stage 4 (severe): Secondary | ICD-10-CM

## 2021-09-25 DIAGNOSIS — N186 End stage renal disease: Secondary | ICD-10-CM

## 2021-09-25 DIAGNOSIS — Z992 Dependence on renal dialysis: Secondary | ICD-10-CM

## 2021-09-27 ENCOUNTER — Other Ambulatory Visit: Payer: Self-pay

## 2021-09-27 ENCOUNTER — Ambulatory Visit (HOSPITAL_COMMUNITY)
Admission: EM | Admit: 2021-09-27 | Discharge: 2021-09-27 | Disposition: A | Discharge status: Home / Self Care | Payer: Medicare HMO | Attending: Emergency Medicine | Admitting: Emergency Medicine

## 2021-09-27 ENCOUNTER — Encounter (HOSPITAL_COMMUNITY): Payer: Self-pay | Admitting: Emergency Medicine

## 2021-09-27 DIAGNOSIS — N3001 Acute cystitis with hematuria: Secondary | ICD-10-CM

## 2021-09-27 LAB — POCT URINALYSIS DIPSTICK, ED / UC
Bilirubin Urine: NEGATIVE
Glucose, UA: 100 mg/dL — AB
Ketones, ur: NEGATIVE mg/dL
Nitrite: NEGATIVE
Protein, ur: 100 mg/dL — AB
Specific Gravity, Urine: 1.015 (ref 1.005–1.030)
Urobilinogen, UA: 0.2 mg/dL (ref 0.0–1.0)
pH: 8 (ref 5.0–8.0)

## 2021-09-27 MED ORDER — CEPHALEXIN 500 MG PO CAPS: 500.0000 mg | ORAL_CAPSULE | Freq: Two times a day (BID) | ORAL | 0 refills | Status: AC

## 2021-09-27 NOTE — ED Triage Notes (Signed)
Pt presents with back pain Xs 3 days.

## 2021-09-27 NOTE — Discharge Instructions (Addendum)
  Please take your antibiotic as prescribed. A urine culture has been sent to check the severity of your urinary infection and to determine if you are on the most appropriate antibiotic. The results should come back within 2-3 days. You will only be notified if a medication change is indicated.  Please follow up with your nephrologist tomorrow to discuss getting back on track for dialysis.  Call 911 or go to the emergency department if symptoms worsening- worsening pain, dizziness, unable to keep down fluids, or other new concerning symptoms develop.

## 2021-09-27 NOTE — ED Provider Notes (Signed)
[place]    CSN: [id number] Arrival date & time: 09/27/21  1205      History   Chief Complaint Chief Complaint  Patient presents with   Back Pain    HPI Diamond [name] is a 70 y.o. female.   HPI Diamond [name] is a 70 y.o. female presenting to UC with c/o Left side low back pain for about 3 days. She recalls lifting a water jug that was half full the other day but no specific injury she recalls. Pain is aching and sore, worse with certain movements, 4/09.  She is on dialysis Monday, Thursday, Saturday but missed Thursday and Saturday due to her back pain. Denies fever, chills, n/v/d but has had mild generalized abdominal discomfort.  Pt still produces "a lot" of urine and states it is "bright yellow" but no blood or cloudiness.  Denies pain.  Last UTI was "many many years ago."     Past Medical History:  Diagnosis Date   Asthma    Chronic kidney disease    Gout    Hypertension     Patient Active Problem List   Diagnosis Date Noted   Vitamin D deficiency 01/02/2021   Hyperparathyroidism ([place]) 12/31/2020   CKD (chronic kidney disease), stage III ([place]) 11/10/2019   Cardiomegaly 11/05/2019   Hyperkalemia 11/05/2019   Hypokalemia 10/18/2019   Pneumonia due to COVID-19 virus 10/17/2019   CAP (community acquired pneumonia) 10/16/2019   Hypertension    Asthma    Gout    AKI (acute kidney injury) ([place])    Hyponatremia    Anemia of chronic disease     Past Surgical History:  Procedure Laterality Date   CESAREAN SECTION  1989    OB History   No obstetric history on file.      Home Medications    Prior to Admission medications   Medication Sig Start Date End Date Taking? Authorizing Provider  cephALEXin (KEFLEX) 500 MG capsule Take 1 capsule (500 mg total) by mouth 2 (two) times daily for 10 days. 09/27/21 10/07/21 Yes Joshva Labreck O, PA-C  albuterol (VENTOLIN HFA) 108 (90 Base) MCG/ACT inhaler Inhale 2 puffs into the lungs every 4 (four) hours as  needed for wheezing or shortness of breath. 10/08/19   [provider]  atenolol (TENORMIN) 25 MG tablet Take 25 mg by mouth daily.    [provider]  budesonide-formoterol (SYMBICORT) 160-4.5 MCG/ACT inhaler Inhale 2 puffs into the lungs 2 (two) times daily.    [provider]  montelukast (SINGULAIR) 10 MG tablet Take 10 mg by mouth at bedtime. Patient not taking: No sig reported    [provider]  traMADol (ULTRAM) 50 MG tablet Take 1 tablet (50 mg total) by mouth every 6 (six) hours as needed. 09/10/21   Drenda Freeze, MD  verapamil (CALAN) 120 MG tablet Take 120 mg by mouth 3 (three) times daily.    [provider]    Family History Family History  Problem Relation Age of Onset   Hypertension Mother    Hypertension Father    Colon cancer Brother    Lung cancer Brother     Social History Social History   Tobacco Use   Smoking status: Never   Smokeless tobacco: Never  Vaping Use   Vaping Use: Never used  Substance Use Topics   Alcohol use: No   Drug use: No     Allergies   Patient has no known allergies.  Review of Systems Review of Systems  Constitutional:  Negative for appetite change, chills and fever.  Gastrointestinal:  Positive for abdominal pain (mild generalized). Negative for diarrhea, nausea and vomiting.  Genitourinary:  Negative for dysuria and pelvic pain.  Musculoskeletal:  Positive for back pain and myalgias.  Skin:  Negative for rash.    Physical Exam Triage Vital Signs ED Triage Vitals  Enc Vitals Group     BP 09/27/21 1232 (!) 174/111     Pulse Rate 09/27/21 1232 (!) 105     Resp 09/27/21 1232 17     Temp 09/27/21 1232 98.7 F (37.1 C)     Temp Source 09/27/21 1232 Oral     SpO2 09/27/21 1232 98 %     Weight --      Height --      Head Circumference --      Peak Flow --      Pain Score 09/27/21 1231 8     Pain Loc --      Pain Edu? --      Excl. in [place]? --    No data  found.  Updated Vital Signs BP (!) 174/111 (BP Location: Right Arm)   Pulse (!) 105   Temp 98.7 F (37.1 C) (Oral)   Resp 17   SpO2 98%   Visual Acuity Right Eye Distance:   Left Eye Distance:   Bilateral Distance:    Right Eye Near:   Left Eye Near:    Bilateral Near:     Physical Exam Vitals and nursing note reviewed.  Constitutional:      General: She is not in acute distress.    Appearance: Normal appearance. She is well-developed. She is not ill-appearing, toxic-appearing or diaphoretic.  HENT:     Head: Normocephalic and atraumatic.  Cardiovascular:     Rate and Rhythm: Regular rhythm. Tachycardia present.  Pulmonary:     Effort: Pulmonary effort is normal. No respiratory distress.     Breath sounds: Normal breath sounds.  Abdominal:     General: There is no distension.     Palpations: Abdomen is soft. There is no mass.     Tenderness: There is abdominal tenderness (mild generalized). There is no right CVA tenderness, left CVA tenderness, guarding or rebound.     Hernia: No hernia is present.  Musculoskeletal:        General: No swelling, tenderness or deformity. Normal range of motion.     Cervical back: Normal range of motion.  Skin:    General: Skin is warm and dry.  Neurological:     Mental Status: She is alert and oriented to person, place, and time.  Psychiatric:        Behavior: Behavior normal.     UC Treatments / Results  Labs (all labs ordered are listed, but only abnormal results are displayed) Labs Reviewed  POCT URINALYSIS DIPSTICK, ED / UC - Abnormal; Notable for the following components:      Result Value   Glucose, UA 100 (*)    Hgb urine dipstick TRACE (*)    Protein, ur 100 (*)    Leukocytes,Ua LARGE (*)    All other components within normal limits    EKG   Radiology VAS [place] UPPER EXTREMITY ARTERIAL DUPLEX  Result Date: 09/25/2021  UPPER EXTREMITY DUPLEX STUDY Patient Name:  DIAHANN GUAJARDO  Date of Exam:   09/25/2021 Medical  Rec #: [id number]       Accession #:  [id number] Date of Birth: [birth year]      Patient Gender: F Patient Age:   17 years Exam Location:  [facility] Procedure:      VAS [place] UPPER EXTREMITY ARTERIAL DUPLEX Referring Phys: Servando Snare --------------------------------------------------------------------------------  Indications: Access placement. History:     Patient has a history of CKD III.  Performing Technologist: Ronal Fear RVS, RCS  Examination Guidelines: A complete evaluation includes B-mode imaging, spectral Doppler, color Doppler, and power Doppler as needed of all accessible portions of each vessel. Bilateral testing is considered an integral part of a complete examination. Limited examinations for reoccurring indications may be performed as noted.  Right Pre-Dialysis Findings: +-----------------------+----------+--------------------+---------+--------+ Location               PSV (cm/s)Intralum. Diam. (cm)Waveform Comments +-----------------------+----------+--------------------+---------+--------+ Brachial Antecub. fossa79        0.55                triphasic         +-----------------------+----------+--------------------+---------+--------+ Radial Art at Wrist    70        0.24                triphasic         +-----------------------+----------+--------------------+---------+--------+ Ulnar Art at Wrist     35        0.19                biphasic          +-----------------------+----------+--------------------+---------+--------+  Left Pre-Dialysis Findings: +-----------------------+----------+--------------------+---------+--------+ Location               PSV (cm/s)Intralum. Diam. (cm)Waveform Comments +-----------------------+----------+--------------------+---------+--------+ Brachial Antecub. fossa85        0.50                triphasic         +-----------------------+----------+--------------------+---------+--------+ Radial Art at Wrist     80        0.26                triphasic         +-----------------------+----------+--------------------+---------+--------+ Ulnar Art at Wrist     57        0.19                triphasic         +-----------------------+----------+--------------------+---------+--------+  Summary:  Right: No obstruction visualized in the right upper extremity. Left: No obstruction visualized in the left upper extremity. *See table(s) above for measurements and observations. Electronically signed by Jamelle Haring on 09/25/2021 at 5:07:03 PM.    Final    VAS [place] UPPER EXT VEIN MAPPING (PRE-OP AVF)  Result Date: 09/25/2021 UPPER EXTREMITY VEIN MAPPING Patient Name:  ADALYNE LOVICK  Date of Exam:   09/25/2021 Medical Rec #: [id number]       Accession #:    [id number] Date of Birth: [birth year]      Patient Gender: F Patient Age:   70 years Exam Location:  [facility] Procedure:      VAS [place] UPPER EXT VEIN MAPPING (PRE-OP AVF) Referring Phys: Servando Snare --------------------------------------------------------------------------------  Indications: Pre-access. History: CKD III.  Performing Technologist: Ronal Fear RVS, RCS  Examination Guidelines: A complete evaluation includes B-mode imaging, spectral Doppler, color Doppler, and power Doppler as needed of all accessible portions of each vessel. Bilateral testing is considered an integral part of a complete examination. Limited examinations for reoccurring indications may be performed as noted. +-----------------+-------------+----------+--------+ Right Cephalic  Diameter (cm)Depth (cm)Findings +-----------------+-------------+----------+--------+ Shoulder             0.14                        +-----------------+-------------+----------+--------+ Prox upper arm       0.13                        +-----------------+-------------+----------+--------+ Mid upper arm        0.18                         +-----------------+-------------+----------+--------+ Dist upper arm       0.13                        +-----------------+-------------+----------+--------+ Antecubital fossa    0.14                        +-----------------+-------------+----------+--------+ Prox forearm         0.17                        +-----------------+-------------+----------+--------+ Mid forearm          0.15                        +-----------------+-------------+----------+--------+ Dist forearm         0.15                        +-----------------+-------------+----------+--------+ +-----------------+-------------+----------+--------+ Right Basilic    Diameter (cm)Depth (cm)Findings +-----------------+-------------+----------+--------+ Prox upper arm       0.25                        +-----------------+-------------+----------+--------+ Mid upper arm        0.24                        +-----------------+-------------+----------+--------+ Dist upper arm       0.23                        +-----------------+-------------+----------+--------+ Antecubital fossa    0.35                        +-----------------+-------------+----------+--------+ Prox forearm         0.13                        +-----------------+-------------+----------+--------+ +-----------------+-------------+----------+--------+ Left Cephalic    Diameter (cm)Depth (cm)Findings +-----------------+-------------+----------+--------+ Shoulder             0.21                        +-----------------+-------------+----------+--------+ Prox upper arm       0.21                        +-----------------+-------------+----------+--------+ Mid upper arm        0.32               tortuous +-----------------+-------------+----------+--------+ Dist upper arm       0.28                        +-----------------+-------------+----------+--------+  Antecubital fossa    0.25                         +-----------------+-------------+----------+--------+ Prox forearm         0.20                        +-----------------+-------------+----------+--------+ Mid forearm          0.21               tortuous +-----------------+-------------+----------+--------+ Dist forearm         0.18                        +-----------------+-------------+----------+--------+ +-----------------+-------------+----------+--------+ Left Basilic     Diameter (cm)Depth (cm)Findings +-----------------+-------------+----------+--------+ Prox upper arm       0.41                        +-----------------+-------------+----------+--------+ Mid upper arm        0.40                        +-----------------+-------------+----------+--------+ Dist upper arm       0.43                        +-----------------+-------------+----------+--------+ Antecubital fossa    0.43                        +-----------------+-------------+----------+--------+ Prox forearm         0.33                        +-----------------+-------------+----------+--------+ Summary: Right: Patent and compressible cephalic and basilic veins. Left: Patent and compressible cephalic and basilic veins.       The cephalic vein is mildly tortuous in the upper and lower       arm. *See table(s) above for measurements and observations.  Diagnosing physician: Jamelle Haring Electronically signed by Jamelle Haring on 09/25/2021 at 78:06:56 PM.    Final     Procedures Procedures (including critical care time)  Medications Ordered in UC Medications - No data to display  Initial Impression / Assessment and Plan / UC Course  I have reviewed the triage vital signs and the nursing notes.  Pertinent labs & imaging results that were available during my care of the patient were reviewed by me and considered in my medical decision making (see chart for details).    Left low back pain without specific injury. No localized  tenderness. UA: Large leukocytes, trace blood, glucose- 100 Urine culture sent. Pt is afebrile, non-toxic appearing. Discussed pt with Dr. Lanny Cramp. Pt safe for discharge home, start on cephalexin 500mg  BID for 10 days Pt was told dialysis center will try to fit her into the schedule tomorrow for dialysis, otherwise her normal days are Tuesday, Thursday, Saturday. Strict instructions to call 911 or go to emergency department if symptoms worsening. Pt verbalized understanding and agreement with tx plan. AVS given.   Final Clinical Impressions(s) / UC Diagnoses   Final diagnoses:  Acute cystitis with hematuria     Discharge Instructions       Please take your antibiotic as prescribed. A urine culture has been sent to check the severity of your urinary infection and to determine if you are on the most  appropriate antibiotic. The results should come back within 2-3 days. You will only be notified if a medication change is indicated.  Please follow up with your nephrologist tomorrow to discuss getting back on track for dialysis.  Call 911 or go to the emergency department if symptoms worsening- worsening pain, dizziness, unable to keep down fluids, or other new concerning symptoms develop.      ED Prescriptions     Medication Sig Dispense Auth. Provider   cephALEXin (KEFLEX) 500 MG capsule Take 1 capsule (500 mg total) by mouth 2 (two) times daily for 10 days. 20 capsule Noe Gens, PA-C      PDMP not reviewed this encounter.   Noe Gens, PA-C 09/27/21 1416

## 2021-10-01 ENCOUNTER — Emergency Department (HOSPITAL_COMMUNITY): Payer: Medicare HMO

## 2021-10-01 ENCOUNTER — Encounter (HOSPITAL_COMMUNITY): Payer: Self-pay | Admitting: Emergency Medicine

## 2021-10-01 ENCOUNTER — Emergency Department (HOSPITAL_COMMUNITY)
Admission: EM | Admit: 2021-10-01 | Discharge: 2021-10-01 | Disposition: A | Discharge status: Home / Self Care | Payer: Medicare HMO | Attending: Emergency Medicine | Admitting: Emergency Medicine

## 2021-10-01 DIAGNOSIS — J45909 Unspecified asthma, uncomplicated: Secondary | ICD-10-CM | POA: Diagnosis not present

## 2021-10-01 DIAGNOSIS — K760 Fatty (change of) liver, not elsewhere classified: Secondary | ICD-10-CM | POA: Diagnosis not present

## 2021-10-01 DIAGNOSIS — I700 Atherosclerosis of aorta: Secondary | ICD-10-CM | POA: Diagnosis not present

## 2021-10-01 DIAGNOSIS — M549 Dorsalgia, unspecified: Secondary | ICD-10-CM

## 2021-10-01 DIAGNOSIS — K8020 Calculus of gallbladder without cholecystitis without obstruction: Secondary | ICD-10-CM | POA: Insufficient documentation

## 2021-10-01 DIAGNOSIS — N186 End stage renal disease: Secondary | ICD-10-CM | POA: Diagnosis not present

## 2021-10-01 DIAGNOSIS — Z8616 Personal history of COVID-19: Secondary | ICD-10-CM | POA: Insufficient documentation

## 2021-10-01 DIAGNOSIS — K429 Umbilical hernia without obstruction or gangrene: Secondary | ICD-10-CM | POA: Diagnosis not present

## 2021-10-01 DIAGNOSIS — M545 Low back pain, unspecified: Secondary | ICD-10-CM | POA: Diagnosis not present

## 2021-10-01 DIAGNOSIS — I120 Hypertensive chronic kidney disease with stage 5 chronic kidney disease or end stage renal disease: Secondary | ICD-10-CM | POA: Insufficient documentation

## 2021-10-01 DIAGNOSIS — R109 Unspecified abdominal pain: Secondary | ICD-10-CM | POA: Diagnosis not present

## 2021-10-01 DIAGNOSIS — N2889 Other specified disorders of kidney and ureter: Secondary | ICD-10-CM | POA: Diagnosis not present

## 2021-10-01 LAB — URINALYSIS, ROUTINE W REFLEX MICROSCOPIC
Bacteria, UA: NONE SEEN
Bilirubin Urine: NEGATIVE
Glucose, UA: 50 mg/dL — AB
Hgb urine dipstick: NEGATIVE
Ketones, ur: NEGATIVE mg/dL
Nitrite: NEGATIVE
Protein, ur: 100 mg/dL — AB
Specific Gravity, Urine: 1.009 (ref 1.005–1.030)
pH: 7 (ref 5.0–8.0)

## 2021-10-01 LAB — COMPREHENSIVE METABOLIC PANEL
ALT: 10 U/L (ref 0–44)
AST: 15 U/L (ref 15–41)
Albumin: 3.2 g/dL — ABNORMAL LOW (ref 3.5–5.0)
Alkaline Phosphatase: 64 U/L (ref 38–126)
Anion gap: 11 (ref 5–15)
BUN: 45 mg/dL — ABNORMAL HIGH (ref 8–23)
CO2: 16 mmol/L — ABNORMAL LOW (ref 22–32)
Calcium: 9.7 mg/dL (ref 8.9–10.3)
Chloride: 110 mmol/L (ref 98–111)
Creatinine, Ser: 8.89 mg/dL — ABNORMAL HIGH (ref 0.44–1.00)
GFR, Estimated: 4 mL/min — ABNORMAL LOW (ref >60)
Glucose, Bld: 83 mg/dL (ref 70–99)
Potassium: 4.9 mmol/L (ref 3.5–5.1)
Sodium: 137 mmol/L (ref 135–145)
Total Bilirubin: 0.7 mg/dL (ref 0.3–1.2)
Total Protein: 7 g/dL (ref 6.5–8.1)

## 2021-10-01 LAB — CBC WITH DIFFERENTIAL/PLATELET
Abs Immature Granulocytes: 0.03 10*3/uL (ref 0.00–0.07)
Basophils Absolute: 0 10*3/uL (ref 0.0–0.1)
Basophils Relative: 0 %
Eosinophils Absolute: 0.2 10*3/uL (ref 0.0–0.5)
Eosinophils Relative: 4 %
HCT: 35.6 % — ABNORMAL LOW (ref 36.0–46.0)
Hemoglobin: 11.3 g/dL — ABNORMAL LOW (ref 12.0–15.0)
Immature Granulocytes: 1 %
Lymphocytes Relative: 21 %
Lymphs Abs: 0.9 10*3/uL (ref 0.7–4.0)
MCH: 31 pg (ref 26.0–34.0)
MCHC: 31.7 g/dL (ref 30.0–36.0)
MCV: 97.8 fL (ref 80.0–100.0)
Monocytes Absolute: 0.4 10*3/uL (ref 0.1–1.0)
Monocytes Relative: 9 %
Neutro Abs: 2.9 10*3/uL (ref 1.7–7.7)
Neutrophils Relative %: 65 %
Platelets: 214 10*3/uL (ref 150–400)
RBC: 3.64 MIL/uL — ABNORMAL LOW (ref 3.87–5.11)
RDW: 17.9 % — ABNORMAL HIGH (ref 11.5–15.5)
WBC: 4.5 10*3/uL (ref 4.0–10.5)
nRBC: 0 % (ref 0.0–0.2)

## 2021-10-01 LAB — LIPASE, BLOOD: Lipase: 42 U/L (ref 11–51)

## 2021-10-01 MED ORDER — DICLOFENAC SODIUM 1 % EX GEL: 2.0000 g | Freq: Four times a day (QID) | CUTANEOUS | 0 refills | Status: DC

## 2021-10-01 NOTE — ED Triage Notes (Signed)
Patient here with complaint of lower back pain more severe on left that started approximately one week ago and has gotten worse. Seen at urgent care three days ago and diagnosed with a UTI, prescribed antibiotics, states pain is worse with movement and palpation. T,TH,Sat dialysis, access in upper left chest.

## 2021-10-01 NOTE — Discharge Instructions (Signed)
You have been seen in the Emergency Department (ED)  today for back pain.  Your workup and exam have not shown any acute abnormalities and you are likely suffering from muscle strain or possible problems with your discs, but there is no treatment that will fix your symptoms at this time.  Please take tylenol as needed for pain. I have also called in a prescription for you that is a pain relief gel to use.    Please follow up with your doctor as soon as possible regarding today's ED visit and your back pain.  Return to the ED for worsening back pain, fever, weakness or numbness of either leg, or if you develop either (1) an inability to urinate or have bowel movements, or (2) loss of your ability to control your bathroom functions (if you start having "accidents"), or if you develop other new symptoms that concern you.

## 2021-10-01 NOTE — ED Provider Notes (Signed)
Emergency Department Provider Note   I have reviewed the triage vital signs and the nursing notes.   HISTORY  Chief Complaint Back Pain   HPI Diamond [name] is a 70 y.o. female with past medical history reviewed below including end-stage renal disease presents to the emergency department with lower back pain.  She describes bandlike pain across her lower back which does not radiate to the legs, abdomen, chest.  She feels that pain is more severe on the left in comparison to the right.  She went to urgent care and was diagnosed with possible urine infection.  She has been taking antibiotics with no significant improvement.  She has remained compliant with dialysis.  She feels that the pain is worse with movement or palpation.  She is not having numbness or weakness in the legs.  Denies fever.  No fall or injury.    Past Medical History:  Diagnosis Date   Asthma    Chronic kidney disease    Gout    Hypertension     Patient Active Problem List   Diagnosis Date Noted   Vitamin D deficiency 01/02/2021   Hyperparathyroidism ([place]) 12/31/2020   CKD (chronic kidney disease), stage III ([place]) 11/10/2019   Cardiomegaly 11/05/2019   Hyperkalemia 11/05/2019   Hypokalemia 10/18/2019   Pneumonia due to COVID-19 virus 10/17/2019   CAP (community acquired pneumonia) 10/16/2019   Hypertension    Asthma    Gout    AKI (acute kidney injury) ([place])    Hyponatremia    Anemia of chronic disease     Past Surgical History:  Procedure Laterality Date   [place]    Allergies Patient has no known allergies.  Family History  Problem Relation Age of Onset   Hypertension Mother    Hypertension Father    Colon cancer Brother    Lung cancer Brother     Social History Social History   Tobacco Use   Smoking status: Never   Smokeless tobacco: Never  Vaping Use   Vaping Use: Never used  Substance Use Topics   Alcohol use: No   Drug use: No    Review of  Systems  Constitutional: No fever/chills Eyes: No visual changes. ENT: No sore throat. Cardiovascular: Denies chest pain. Respiratory: Denies shortness of breath. Gastrointestinal: No abdominal pain.  No nausea, no vomiting.  No diarrhea.  No constipation. Genitourinary: Negative for dysuria. Musculoskeletal: Positive for back pain. Skin: Negative for rash. Neurological: Negative for headaches, focal weakness or numbness.  10-point ROS otherwise negative.  ____________________________________________   PHYSICAL EXAM:  VITAL SIGNS: ED Triage Vitals  Enc Vitals Group     BP 10/01/21 1131 (!) 172/124     Pulse Rate 10/01/21 1131 95     Resp 10/01/21 1131 16     Temp 10/01/21 1131 97.9 F (36.6 C)     Temp Source 10/01/21 1131 Oral     SpO2 10/01/21 1131 100 %    Constitutional: Alert and oriented. Well appearing and in no acute distress. Eyes: Conjunctivae are normal.  Head: Atraumatic. Nose: No congestion/rhinnorhea. Mouth/Throat: Mucous membranes are moist. Neck: No stridor.  Cardiovascular: Normal rate, regular rhythm. Good peripheral circulation. Grossly normal heart sounds.   Respiratory: Normal respiratory effort.  No retractions. Lungs CTAB. Gastrointestinal: Soft and nontender. No distention.  Musculoskeletal: No lower extremity tenderness nor edema. No gross deformities of extremities.  Both midline and paraspinal tenderness in the lumbar spine region.  No step-off or deformity.  Neurologic:  Normal speech and language. No gross focal neurologic deficits are appreciated. 2+ patellar reflexes bilaterally. Normal strength and sensation in the LEs.  Skin:  Skin is warm, dry and intact. No rash noted.   ____________________________________________   LABS (all labs ordered are listed, but only abnormal results are displayed)  Labs Reviewed  URINALYSIS, ROUTINE W REFLEX MICROSCOPIC - Abnormal; Notable for the following components:      Result Value   Color, Urine  STRAW (*)    Glucose, UA 50 (*)    Protein, ur 100 (*)    Leukocytes,Ua TRACE (*)    All other components within normal limits  COMPREHENSIVE METABOLIC PANEL - Abnormal; Notable for the following components:   CO2 16 (*)    BUN 45 (*)    Creatinine, Ser 8.89 (*)    Albumin 3.2 (*)    GFR, Estimated 4 (*)    All other components within normal limits  CBC WITH DIFFERENTIAL/PLATELET - Abnormal; Notable for the following components:   RBC 3.64 (*)    Hemoglobin 11.3 (*)    HCT 35.6 (*)    RDW 17.9 (*)    All other components within normal limits  URINE CULTURE  LIPASE, BLOOD   ____________________________________________  RADIOLOGY  DG Lumbar Spine Complete  Result Date: 10/01/2021 CLINICAL DATA:  Low back pain for months, no known injury EXAM: LUMBAR SPINE - COMPLETE 4+ VIEW COMPARISON:  None. FINDINGS: There is no evidence of lumbar spine fracture. Alignment is normal. Mild disc space height loss and osteophytosis throughout the lumbar spine, focally moderate at L5-S1. Mild multilevel facet degenerative change. IMPRESSION: No fracture or dislocation of the lumbar spine. Mild disc space height loss and osteophytosis throughout the lumbar spine, focally moderate at L5-S1. Mild multilevel facet degenerative change. Lumbar disc and neural foraminal pathology may be further evaluated by MRI if indicated by neurologically localizing signs and symptoms. Electronically Signed   By: Delanna Ahmadi M.D.   On: 10/01/2021 13:16   CT Renal Stone Study  Result Date: 10/01/2021 CLINICAL DATA:  Flank pain EXAM: CT ABDOMEN AND PELVIS WITHOUT CONTRAST TECHNIQUE: Multidetector CT imaging of the abdomen and pelvis was performed following the standard protocol without IV contrast. COMPARISON:  None. FINDINGS: Lower chest: Lung bases demonstrate no acute consolidation or effusion. Normal cardiac size. Hepatobiliary: Multiple gallstones. Possible mild gallbladder wall thickening but no inflammatory change. No  focal hepatic abnormality or biliary dilatation Pancreas: Unremarkable. No pancreatic ductal dilatation or surrounding inflammatory changes. Spleen: Granulomas. Adrenals/Urinary Tract: Adrenal glands are normal. No convincing hydronephrosis. No ureteral stone. Benign-appearing cortical calcification lower pole right kidney. Bilateral low-density lesions are probably cysts but cannot further characterize without contrast. The bladder is normal Stomach/Bowel: Stomach is within normal limits. Appendix appears normal. No evidence of bowel wall thickening, distention, or inflammatory changes. Vascular/Lymphatic: Moderate aortic atherosclerosis. No aneurysm. No suspicious nodes Reproductive: Uterus and bilateral adnexa are unremarkable. Other: Negative for pelvic effusion or free air. Small fat containing periumbilical hernia Musculoskeletal: Degenerative changes.  No acute osseous abnormality IMPRESSION: 1. Negative for hydronephrosis or ureteral stone. 2. Multiple gallstones. Possible mild gallbladder wall thickening; if symptoms are referable to right upper quadrant, consider correlation with ultrasound Electronically Signed   By: Donavan Foil M.D.   On: 10/01/2021 18:12   US Abdomen Limited RUQ (LIVER/GB)  Result Date: 10/01/2021 CLINICAL DATA:  Back pain. EXAM: ULTRASOUND ABDOMEN LIMITED RIGHT UPPER QUADRANT COMPARISON:  None. FINDINGS: Gallbladder: Multiple shadowing echogenic gallstones are seen within the  gallbladder. The largest measures approximately 2.7 cm. Echogenic gallbladder sludge is also noted. The gallbladder wall measures 2.7 mm in thickness. No sonographic Murphy sign noted by sonographer. Common bile duct: Diameter: 8.9 mm Liver: No focal lesion identified. Diffusely increased echogenicity of the liver parenchyma is noted. Portal vein is patent on color Doppler imaging with normal direction of blood flow towards the liver. Other: None. IMPRESSION: 1. Cholelithiasis and gallbladder sludge without  evidence of acute cholecystitis. 2. Hepatic steatosis. Electronically Signed   By: Virgina Norfolk M.D.   On: 10/01/2021 19:27    ____________________________________________   PROCEDURES  Procedure(s) performed:   Procedures  None  ____________________________________________   INITIAL IMPRESSION / ASSESSMENT AND PLAN / ED COURSE  Pertinent labs & imaging results that were available during my care of the patient were reviewed by me and considered in my medical decision making (see chart for details).   Patient presents emergency department for evaluation of back pain.  She has no red flag signs or symptoms to suspect acute spine emergency.   Differential diagnosis includes but is not exclusive to musculoskeletal back pain, renal colic, urinary tract infection, pyelonephritis, intra-abdominal causes of back pain, aortic aneurysm or dissection, cauda equina syndrome, sciatica, lumbar disc disease, thoracic disc disease, etc.  Patient's UA reviewed here.  She has trace leukocytes but no bacteria, WBCs, nitrite.  We will send for culture but patient is already on antibiotics.  Do not see an indication to change her antibiotics at this time.  Patient has no emergent indication for dialysis.  Her lab work here is reassuring.  Reviewed the plain films ordered during the MSE process of her lumbar spine.  I do not feel that her exam warrants emergent MRI of her lumbar spine.  This seems more musculoskeletal.  I did obtain a CT scan without contrast to evaluate for etiology of her left flank pain such as ureteral stone.  CT showed some gallstones as well as some possible wall thickening.  Ultrasound shows the same with no evidence of acute cholecystitis.  Plan for topical medications for her back discomfort.  Patient will continue to follow with her dialysis appointments and PCP.   ____________________________________________  FINAL CLINICAL IMPRESSION(S) / ED DIAGNOSES  Final diagnoses:  Back  pain  Calculus of gallbladder without cholecystitis without obstruction    NEW OUTPATIENT MEDICATIONS STARTED DURING THIS VISIT:  New Prescriptions   DICLOFENAC SODIUM (VOLTAREN) 1 % GEL    Apply 2 g topically 4 (four) times daily.    Note:  This document was prepared using Dragon voice recognition software and may include unintentional dictation errors.  Nanda Quinton, MD, [facility] Emergency Medicine    Chealsea Paske, W[facility], MD 10/01/21 [phone]

## 2021-10-01 NOTE — ED Provider Notes (Signed)
Emergency Medicine Provider Triage Evaluation Note  Diamond [name] , a 70 y.o. female  was evaluated in triage.  Pt complains of left lower back pain x 1 week, seen at urgent care 3 days ago and diagnosed.  With a UTI, completed antibiotics but continues to have pain.  Pain is worse with movement and palpation.  Attends dialysis Tuesday, Thursday, Saturday, did complete full treatment today, has access and chest.  Review of Systems  Positive: Low back pain, urinary frequency Negative: Dysuria, abdominal pain, fever, vomiting  Physical Exam  BP (!) 172/124 (BP Location: Left Arm)   Pulse 95   Temp 97.9 F (36.6 C) (Oral)   Resp 16   SpO2 100%  Gen:   Awake, no distress   Resp:  Normal effort  MSK:   Moves extremities without difficulty  Other:    Medical Decision Making  Medically screening exam initiated at 12:32 PM.  Appropriate orders placed.  Diamond [name] was informed that the remainder of the evaluation will be completed by another provider, this initial triage assessment does not replace that evaluation, and the importance of remaining in the ED until their evaluation is complete.     Tacy Learn, PA-C 10/01/21 1233    Pattricia Boss, MD 10/01/21 [phone]

## 2021-10-02 LAB — URINE CULTURE: Culture: NO GROWTH

## 2021-10-05 DIAGNOSIS — Z992 Dependence on renal dialysis: Secondary | ICD-10-CM | POA: Diagnosis not present

## 2021-10-05 DIAGNOSIS — I129 Hypertensive chronic kidney disease with stage 1 through stage 4 chronic kidney disease, or unspecified chronic kidney disease: Secondary | ICD-10-CM | POA: Diagnosis not present

## 2021-10-05 DIAGNOSIS — N186 End stage renal disease: Secondary | ICD-10-CM | POA: Diagnosis not present

## 2021-10-06 DIAGNOSIS — N186 End stage renal disease: Secondary | ICD-10-CM | POA: Diagnosis not present

## 2021-10-06 DIAGNOSIS — N2581 Secondary hyperparathyroidism of renal origin: Secondary | ICD-10-CM | POA: Diagnosis not present

## 2021-10-06 DIAGNOSIS — Z992 Dependence on renal dialysis: Secondary | ICD-10-CM | POA: Diagnosis not present

## 2021-10-07 DIAGNOSIS — M47816 Spondylosis without myelopathy or radiculopathy, lumbar region: Secondary | ICD-10-CM | POA: Diagnosis not present

## 2021-10-07 DIAGNOSIS — M545 Low back pain, unspecified: Secondary | ICD-10-CM | POA: Diagnosis not present

## 2021-10-07 DIAGNOSIS — I10 Essential (primary) hypertension: Secondary | ICD-10-CM | POA: Diagnosis not present

## 2021-10-07 DIAGNOSIS — K8020 Calculus of gallbladder without cholecystitis without obstruction: Secondary | ICD-10-CM | POA: Diagnosis not present

## 2021-10-08 DIAGNOSIS — N2581 Secondary hyperparathyroidism of renal origin: Secondary | ICD-10-CM | POA: Diagnosis not present

## 2021-10-08 DIAGNOSIS — N186 End stage renal disease: Secondary | ICD-10-CM | POA: Diagnosis not present

## 2021-10-08 DIAGNOSIS — Z992 Dependence on renal dialysis: Secondary | ICD-10-CM | POA: Diagnosis not present

## 2021-10-10 DIAGNOSIS — N186 End stage renal disease: Secondary | ICD-10-CM | POA: Diagnosis not present

## 2021-10-10 DIAGNOSIS — Z992 Dependence on renal dialysis: Secondary | ICD-10-CM | POA: Diagnosis not present

## 2021-10-10 DIAGNOSIS — N2581 Secondary hyperparathyroidism of renal origin: Secondary | ICD-10-CM | POA: Diagnosis not present

## 2021-10-13 ENCOUNTER — Telehealth: Payer: Self-pay

## 2021-10-13 ENCOUNTER — Ambulatory Visit (INDEPENDENT_AMBULATORY_CARE_PROVIDER_SITE_OTHER): Payer: Medicare HMO | Admitting: Vascular Surgery

## 2021-10-13 ENCOUNTER — Other Ambulatory Visit: Payer: Self-pay

## 2021-10-13 DIAGNOSIS — Z992 Dependence on renal dialysis: Secondary | ICD-10-CM | POA: Diagnosis not present

## 2021-10-13 DIAGNOSIS — N2581 Secondary hyperparathyroidism of renal origin: Secondary | ICD-10-CM | POA: Diagnosis not present

## 2021-10-13 DIAGNOSIS — N185 Chronic kidney disease, stage 5: Secondary | ICD-10-CM

## 2021-10-13 DIAGNOSIS — N186 End stage renal disease: Secondary | ICD-10-CM | POA: Diagnosis not present

## 2021-10-13 NOTE — Progress Notes (Signed)
I connected with  Diamond [name] on 10/13/21 by telephone and verified that I am speaking with the correct person using two identifiers.   I discussed the limitations of evaluation and management by telemedicine. The patient expressed understanding and agreed to proceed.  Patient called to discuss upcoming dialysis access surgery. Patient is not interested in pursuing dialysis access surgery at this time, preferring to dialyze through the catheter. I had a long discussion with her explaining the risks, benefits, and alternatives to this approach. I counseled her that risk of sepsis was significantly elevated with long-term catheter use. She is understanding, but not interested in a fistula at this time. Asked her to discuss this with her nephrologist. She can follow-up with [place] at any time for dialysis access surgery, should she change her mind.  Diamond [name]. Stanford Breed, MD [facility] Phone Number: [phone] 10/13/2021 1:05 PM

## 2021-10-13 NOTE — Telephone Encounter (Signed)
Surgery canceled and patient informed.

## 2021-10-13 NOTE — Telephone Encounter (Signed)
-----   Message from [name], MD sent at 10/13/2021 10:26 AM EST ----- Patient wants to cancel surgery next week. Wants to use catheter only. I explained this is a bad idea. I encouraged her to talk to her nephrologist. If she changes her mind it is OK to reschedule her at any time that's good for her. Thank you!  Gershon Mussel

## 2021-10-15 DIAGNOSIS — N2581 Secondary hyperparathyroidism of renal origin: Secondary | ICD-10-CM | POA: Diagnosis not present

## 2021-10-15 DIAGNOSIS — Z992 Dependence on renal dialysis: Secondary | ICD-10-CM | POA: Diagnosis not present

## 2021-10-15 DIAGNOSIS — N186 End stage renal disease: Secondary | ICD-10-CM | POA: Diagnosis not present

## 2021-10-17 DIAGNOSIS — Z992 Dependence on renal dialysis: Secondary | ICD-10-CM | POA: Diagnosis not present

## 2021-10-17 DIAGNOSIS — N2581 Secondary hyperparathyroidism of renal origin: Secondary | ICD-10-CM | POA: Diagnosis not present

## 2021-10-17 DIAGNOSIS — N186 End stage renal disease: Secondary | ICD-10-CM | POA: Diagnosis not present

## 2021-10-19 ENCOUNTER — Encounter (HOSPITAL_COMMUNITY): Admission: RE | Payer: Self-pay | Source: Home / Self Care

## 2021-10-19 ENCOUNTER — Ambulatory Visit (HOSPITAL_COMMUNITY): Admission: RE | Admit: 2021-10-19 | Payer: Medicare HMO | Source: Home / Self Care | Admitting: Vascular Surgery

## 2021-10-19 SURGERY — ARTERIOVENOUS (AV) FISTULA CREATION
Anesthesia: Monitor Anesthesia Care | Laterality: Left

## 2021-10-20 DIAGNOSIS — N186 End stage renal disease: Secondary | ICD-10-CM | POA: Diagnosis not present

## 2021-10-20 DIAGNOSIS — Z992 Dependence on renal dialysis: Secondary | ICD-10-CM | POA: Diagnosis not present

## 2021-10-20 DIAGNOSIS — N2581 Secondary hyperparathyroidism of renal origin: Secondary | ICD-10-CM | POA: Diagnosis not present

## 2021-10-22 DIAGNOSIS — N2581 Secondary hyperparathyroidism of renal origin: Secondary | ICD-10-CM | POA: Diagnosis not present

## 2021-10-22 DIAGNOSIS — N186 End stage renal disease: Secondary | ICD-10-CM | POA: Diagnosis not present

## 2021-10-22 DIAGNOSIS — Z992 Dependence on renal dialysis: Secondary | ICD-10-CM | POA: Diagnosis not present

## 2021-10-24 DIAGNOSIS — N186 End stage renal disease: Secondary | ICD-10-CM | POA: Diagnosis not present

## 2021-10-24 DIAGNOSIS — N2581 Secondary hyperparathyroidism of renal origin: Secondary | ICD-10-CM | POA: Diagnosis not present

## 2021-10-24 DIAGNOSIS — Z992 Dependence on renal dialysis: Secondary | ICD-10-CM | POA: Diagnosis not present

## 2021-10-27 DIAGNOSIS — N186 End stage renal disease: Secondary | ICD-10-CM | POA: Diagnosis not present

## 2021-10-27 DIAGNOSIS — N2581 Secondary hyperparathyroidism of renal origin: Secondary | ICD-10-CM | POA: Diagnosis not present

## 2021-10-27 DIAGNOSIS — Z992 Dependence on renal dialysis: Secondary | ICD-10-CM | POA: Diagnosis not present

## 2021-11-03 DIAGNOSIS — Z992 Dependence on renal dialysis: Secondary | ICD-10-CM | POA: Diagnosis not present

## 2021-11-03 DIAGNOSIS — Z1159 Encounter for screening for other viral diseases: Secondary | ICD-10-CM | POA: Insufficient documentation

## 2021-11-03 DIAGNOSIS — N2581 Secondary hyperparathyroidism of renal origin: Secondary | ICD-10-CM | POA: Diagnosis not present

## 2021-11-03 DIAGNOSIS — N186 End stage renal disease: Secondary | ICD-10-CM | POA: Diagnosis not present

## 2021-11-04 DIAGNOSIS — I129 Hypertensive chronic kidney disease with stage 1 through stage 4 chronic kidney disease, or unspecified chronic kidney disease: Secondary | ICD-10-CM | POA: Diagnosis not present

## 2021-11-04 DIAGNOSIS — N186 End stage renal disease: Secondary | ICD-10-CM | POA: Diagnosis not present

## 2021-11-04 DIAGNOSIS — Z992 Dependence on renal dialysis: Secondary | ICD-10-CM | POA: Diagnosis not present

## 2021-11-07 DIAGNOSIS — Z992 Dependence on renal dialysis: Secondary | ICD-10-CM | POA: Diagnosis not present

## 2021-11-07 DIAGNOSIS — N186 End stage renal disease: Secondary | ICD-10-CM | POA: Diagnosis not present

## 2021-11-07 DIAGNOSIS — N2581 Secondary hyperparathyroidism of renal origin: Secondary | ICD-10-CM | POA: Diagnosis not present

## 2021-11-10 DIAGNOSIS — N186 End stage renal disease: Secondary | ICD-10-CM | POA: Diagnosis not present

## 2021-11-10 DIAGNOSIS — Z992 Dependence on renal dialysis: Secondary | ICD-10-CM | POA: Diagnosis not present

## 2021-11-10 DIAGNOSIS — N2581 Secondary hyperparathyroidism of renal origin: Secondary | ICD-10-CM | POA: Diagnosis not present

## 2021-11-12 ENCOUNTER — Other Ambulatory Visit: Payer: Self-pay

## 2021-11-14 DIAGNOSIS — N186 End stage renal disease: Secondary | ICD-10-CM | POA: Diagnosis not present

## 2021-11-14 DIAGNOSIS — N2581 Secondary hyperparathyroidism of renal origin: Secondary | ICD-10-CM | POA: Diagnosis not present

## 2021-11-14 DIAGNOSIS — Z992 Dependence on renal dialysis: Secondary | ICD-10-CM | POA: Diagnosis not present

## 2021-11-16 NOTE — Telephone Encounter (Signed)
Received notification from Dr. Otelia Santee, that  patient is ready to proceed with scheduling left arm fistula surgery.   Surgery scheduled on Dec 14, 2021 per patient request. Instructions were reviewed and letter will me mailed to patient.

## 2021-11-18 DIAGNOSIS — J4530 Mild persistent asthma, uncomplicated: Secondary | ICD-10-CM | POA: Diagnosis not present

## 2021-11-18 DIAGNOSIS — Z2821 Immunization not carried out because of patient refusal: Secondary | ICD-10-CM | POA: Diagnosis not present

## 2021-11-18 DIAGNOSIS — I10 Essential (primary) hypertension: Secondary | ICD-10-CM | POA: Diagnosis not present

## 2021-11-18 DIAGNOSIS — K219 Gastro-esophageal reflux disease without esophagitis: Secondary | ICD-10-CM | POA: Diagnosis not present

## 2021-11-19 DIAGNOSIS — Z992 Dependence on renal dialysis: Secondary | ICD-10-CM | POA: Diagnosis not present

## 2021-11-19 DIAGNOSIS — N186 End stage renal disease: Secondary | ICD-10-CM | POA: Diagnosis not present

## 2021-11-19 DIAGNOSIS — N2581 Secondary hyperparathyroidism of renal origin: Secondary | ICD-10-CM | POA: Diagnosis not present

## 2021-11-21 DIAGNOSIS — N186 End stage renal disease: Secondary | ICD-10-CM | POA: Diagnosis not present

## 2021-11-21 DIAGNOSIS — Z992 Dependence on renal dialysis: Secondary | ICD-10-CM | POA: Diagnosis not present

## 2021-11-21 DIAGNOSIS — N2581 Secondary hyperparathyroidism of renal origin: Secondary | ICD-10-CM | POA: Diagnosis not present

## 2021-12-01 DIAGNOSIS — N186 End stage renal disease: Secondary | ICD-10-CM | POA: Diagnosis not present

## 2021-12-01 DIAGNOSIS — Z992 Dependence on renal dialysis: Secondary | ICD-10-CM | POA: Diagnosis not present

## 2021-12-01 DIAGNOSIS — N2581 Secondary hyperparathyroidism of renal origin: Secondary | ICD-10-CM | POA: Diagnosis not present

## 2021-12-03 DIAGNOSIS — N186 End stage renal disease: Secondary | ICD-10-CM | POA: Diagnosis not present

## 2021-12-03 DIAGNOSIS — Z992 Dependence on renal dialysis: Secondary | ICD-10-CM | POA: Diagnosis not present

## 2021-12-03 DIAGNOSIS — N2581 Secondary hyperparathyroidism of renal origin: Secondary | ICD-10-CM | POA: Diagnosis not present

## 2021-12-05 DIAGNOSIS — N186 End stage renal disease: Secondary | ICD-10-CM | POA: Diagnosis not present

## 2021-12-05 DIAGNOSIS — I129 Hypertensive chronic kidney disease with stage 1 through stage 4 chronic kidney disease, or unspecified chronic kidney disease: Secondary | ICD-10-CM | POA: Diagnosis not present

## 2021-12-05 DIAGNOSIS — N2581 Secondary hyperparathyroidism of renal origin: Secondary | ICD-10-CM | POA: Diagnosis not present

## 2021-12-05 DIAGNOSIS — Z992 Dependence on renal dialysis: Secondary | ICD-10-CM | POA: Diagnosis not present

## 2021-12-10 DIAGNOSIS — N186 End stage renal disease: Secondary | ICD-10-CM | POA: Diagnosis not present

## 2021-12-10 DIAGNOSIS — Z992 Dependence on renal dialysis: Secondary | ICD-10-CM | POA: Diagnosis not present

## 2021-12-10 DIAGNOSIS — N2581 Secondary hyperparathyroidism of renal origin: Secondary | ICD-10-CM | POA: Diagnosis not present

## 2021-12-11 ENCOUNTER — Encounter (HOSPITAL_COMMUNITY): Payer: Self-pay | Admitting: Vascular Surgery

## 2021-12-11 NOTE — Anesthesia Preprocedure Evaluation (Deleted)
Anesthesia Evaluation    Airway        Dental   Pulmonary           Cardiovascular hypertension,      Neuro/Psych    GI/Hepatic   Endo/Other    Renal/GU      Musculoskeletal   Abdominal   Peds  Hematology   Anesthesia Other Findings   Reproductive/Obstetrics                             Anesthesia Physical Anesthesia Plan  ASA:   Anesthesia Plan:    Post-op Pain Management:    Induction:   PONV Risk Score and Plan:   Airway Management Planned:   Additional Equipment:   Intra-op Plan:   Post-operative Plan:   Informed Consent:   Plan Discussed with:   Anesthesia Plan Comments: (See PAT note written 12/11/2021 by Myra Gianotti, PA-C. )        Anesthesia Quick Evaluation

## 2021-12-11 NOTE — Progress Notes (Signed)
Anesthesia Chart Review: Diamond [name]  Case: [id number] Date/Time: 12/14/21 0948   Procedure: LEFT ARM FIRST STAGE BASILIC VEIN ARTERIOVENOUS (AV) FISTULA CREATION (Left)   Anesthesia type: Regional   Pre-op diagnosis: ESRD   Location: [facility] OR ROOM 12 / [facility]   Surgeons: [name], MD       DISCUSSION: Patient is a 71 year old female scheduled for the above procedure. She is on hemodialysis TTS via a right IJ tunneled dialysis catheter. She was been resistant to getting a permanent access, as she preferred staff to just access her catheter. She contacted [facility] last month and was now willing to proceed with LUE AVF creation.  History includes never smoker, HTN, asthma, gout, ESRD (right IJ [facility] 07/10/21; HD TTS), afib (10/2019 in setting of COVID PNA).  - [place] admission 11/05/19-11/12/19 for weakness and BLE swelling in setting of recent COVID-19 PNA.  Renal function had worsened with Creatinine up to 5. Nephrology suspected renal failure more subacute/chronic than acute, but no indication to initiate dialysis at that time. She was placed on bicarb and required PRBC for anemia. LE [place] negative for DVT and leg pain and swelling attributed to gout. She had recurrent episode of afib, and atenolol, verapamil, Eliquis continued. TSH stable. Echo showed LVEF 60-65%, normal RVSF, mildly dilated LA, mild MR, moderate-severe TR, aortic root 39 mm, normal PASP. Out-patient primary care and nephrology follow-up recommended.    - [place] admission 10/16/19-10/21/19 for COVID-19 PNA and AKI on CKD stage 3. S/p remdesivr and prescribed 10 day steroid course. She developed afib with controlled ventricular rate, but converted quickly to NSR with reinitiation of home verapamil and atenolol. She was started on DOAC for CHA2DS2-VASc score is 3.  Will attempt to get most recent primary records, as it's unclear to me if patient has PAF or now persistent afib. There are limited records from Dr. Vista Lawman in  [place] and in 12/2019 afib was documented as "persistent", although ED visits for back pain and/or cystitis on 09/27/21 and 10/01/21 documented regular rhythm on heart exam--as well as ED visit on 09/09/21 and 04/09/21. She is also no longer on Eliquis, but remains on verapamil. Historically, HR appears controlled rate.   If additional pertinent primary care records received prior to surgery, then I will plan to update my note. If a more recent EKG is not received, then she will need one on arrival. Also BP trends show mainly poorly controlled hypertension. I have communicated this with Dr. Stanford Breed as well as patient being at risk for same day cancellation if she arrives for surgery with a significantly elevated BP. She does have a catheter for dialysis, but nephrology would like her to get permanent HD access to reduce risk for catheter-related infection in the future.  She will get vitals on arrival. Anesthesia team to evaluate on the day of surgery.   VS: Ht 5\' 7"  (1.702 m)    Wt 70.3 kg    BMI 24.28 kg/m  In review of available BP trends in Care Everywhere since 11/18/21 SBP 143-191 and DBP 81-135.  BP Readings from Last 3 Encounters:  10/01/21 (!) 183/114  09/27/21 (!) 174/111  09/25/21 (!) 174/110     PROVIDERS: [name], MD is PCP  Collier Flowers, MD is endocrinologist. Last evaluation 12/31/20 for PTH-Mediated hypercalcemia, primary versus secondary to CKD. She was not yet ready for dialysis. Vit D supplement recommended. Follow-up 3 months. Donato Heinz, MD is nephrologist   LABS: For  day of surgery. As of 12/03/21 ([facility]) HGB 10.9, BUN 55 and on 12/01/21 K 4.2, Ca 9.5 and on 11/14/21 PLT 122, Cr 7.03   IMAGES: [place] Abd 10/01/21: IMPRESSION: 1. Cholelithiasis and gallbladder sludge without evidence of acute cholecystitis. 2. Hepatic steatosis.   CT Renal stone 10/01/21: IMPRESSION: 1. Negative for hydronephrosis or ureteral stone. 2. Multiple  gallstones. Possible mild gallbladder wall thickening; if symptoms are referable to right upper quadrant, consider correlation with ultrasound   EKG: For day of surgery unless more recent EKG received from [facility].   CV: Echo 11/06/19: IMPRESSIONS   1. Left ventricular ejection fraction, by visual estimation, is 60 to  65%. The left ventricle has normal function. There is no left ventricular  hypertrophy.   2. Left ventricular diastolic parameters are indeterminate.   3. Global right ventricle has normal systolic function.The right  ventricular size is normal. No increase in right ventricular wall  thickness.   4. Left atrial size was mildly dilated.   5. Right atrial size was normal.   6. Mild mitral annular calcification.   7. Moderate thickening of the mitral valve leaflet(s).   8. The mitral valve is normal in structure. Mild mitral valve  regurgitation. No evidence of mitral stenosis.   9. The tricuspid valve is normal in structure. Tricuspid valve  regurgitation moderate-severe.  10. The aortic valve is tricuspid. Aortic valve regurgitation is not  visualized. Sclerosis.  11. There is Moderate calcification of the aortic valve.  12. There is Moderate thickening of the aortic valve.  13. The pulmonic valve was grossly normal. Pulmonic valve regurgitation is  mild.  14. Aortic dilatation noted.  15. There is mild dilatation of the aortic root measuring 39 mm.  16. Normal pulmonary artery systolic pressure.  17. The inferior vena cava is normal in size with greater than 50%  respiratory variability, suggesting right atrial pressure of 3 mmHg.    Past Medical History:  Diagnosis Date   Asthma    Chronic kidney disease    dialysis Tues Thurs Sat   Dysrhythmia    PAF 10/2019 in setting of COVID-19 PNA   Gout    Hypertension     Past Surgical History:  Procedure Laterality Date   CESAREAN SECTION  1989    MEDICATIONS: No current  facility-administered medications for this encounter.    albuterol (VENTOLIN HFA) 108 (90 Base) MCG/ACT inhaler   colchicine 0.6 MG tablet   verapamil (CALAN) 120 MG tablet   diclofenac Sodium (VOLTAREN) 1 % GEL   traMADol (ULTRAM) 50 MG tablet    Myra Gianotti, PA-C Surgical Short Stay/Anesthesiology [facility] Phone [phone] [facility] Phone [phone] 12/11/2021 2:01 PM

## 2021-12-11 NOTE — Progress Notes (Signed)
DUE TO COVID-19 ONLY ONE VISITOR IS ALLOWED TO COME WITH YOU AND STAY IN THE WAITING ROOM ONLY DURING PRE OP AND PROCEDURE DAY OF SURGERY.   PCP - Dr Iona Beard Osei-Bonsu  Cardiologist - n/a  Chest x-ray - n/a EKG - DOS Stress Test - n/a ECHO - 11/06/19 Cardiac Cath - n/a  ICD Pacemaker/Loop - n/a  Sleep Study -  n/a CPAP - none  Anesthesia review: Yes  STOP now taking any Aspirin (unless otherwise instructed by your surgeon), Aleve, Naproxen, Ibuprofen, Motrin, Advil, Goody's, BC's, all herbal medications, fish oil, and all vitamins.   Coronavirus Screening Covid test n/a Ambulatory Surgery  Do you have any of the following symptoms:  Cough yes/no: No Fever (>100.57F)  yes/no: No Runny nose yes/no: No Sore throat yes/no: No Difficulty breathing/shortness of breath  yes/no: No  Have you traveled in the last 14 days and where? yes/no: No  Patient verbalized understanding of instructions that were given via phone.

## 2021-12-14 ENCOUNTER — Ambulatory Visit (HOSPITAL_COMMUNITY): Admission: RE | Admit: 2021-12-14 | Payer: Medicare HMO | Source: Home / Self Care | Admitting: Vascular Surgery

## 2021-12-14 HISTORY — DX: Cardiac arrhythmia, unspecified: I49.9

## 2021-12-14 SURGERY — ARTERIOVENOUS (AV) FISTULA CREATION
Anesthesia: Regional | Laterality: Left

## 2021-12-15 ENCOUNTER — Emergency Department (HOSPITAL_COMMUNITY)
Admission: EM | Admit: 2021-12-15 | Discharge: 2021-12-15 | Discharge status: Against Medical Advice | Payer: Medicare HMO | Source: Home / Self Care

## 2021-12-15 ENCOUNTER — Encounter (HOSPITAL_COMMUNITY): Payer: Self-pay | Admitting: Emergency Medicine

## 2021-12-15 ENCOUNTER — Emergency Department (HOSPITAL_COMMUNITY): Payer: Medicare HMO

## 2021-12-15 DIAGNOSIS — N186 End stage renal disease: Secondary | ICD-10-CM | POA: Diagnosis present

## 2021-12-15 DIAGNOSIS — I499 Cardiac arrhythmia, unspecified: Secondary | ICD-10-CM | POA: Diagnosis not present

## 2021-12-15 DIAGNOSIS — J90 Pleural effusion, not elsewhere classified: Secondary | ICD-10-CM | POA: Diagnosis not present

## 2021-12-15 DIAGNOSIS — Z5321 Procedure and treatment not carried out due to patient leaving prior to being seen by health care provider: Secondary | ICD-10-CM | POA: Insufficient documentation

## 2021-12-15 DIAGNOSIS — K5900 Constipation, unspecified: Secondary | ICD-10-CM | POA: Diagnosis not present

## 2021-12-15 DIAGNOSIS — I340 Nonrheumatic mitral (valve) insufficiency: Secondary | ICD-10-CM | POA: Diagnosis not present

## 2021-12-15 DIAGNOSIS — R0689 Other abnormalities of breathing: Secondary | ICD-10-CM | POA: Diagnosis not present

## 2021-12-15 DIAGNOSIS — Z743 Need for continuous supervision: Secondary | ICD-10-CM | POA: Diagnosis not present

## 2021-12-15 DIAGNOSIS — I513 Intracardiac thrombosis, not elsewhere classified: Secondary | ICD-10-CM | POA: Diagnosis present

## 2021-12-15 DIAGNOSIS — K429 Umbilical hernia without obstruction or gangrene: Secondary | ICD-10-CM | POA: Diagnosis not present

## 2021-12-15 DIAGNOSIS — M79644 Pain in right finger(s): Secondary | ICD-10-CM | POA: Insufficient documentation

## 2021-12-15 DIAGNOSIS — T82838S Hemorrhage of vascular prosthetic devices, implants and grafts, sequela: Secondary | ICD-10-CM | POA: Diagnosis not present

## 2021-12-15 DIAGNOSIS — M4620 Osteomyelitis of vertebra, site unspecified: Secondary | ICD-10-CM | POA: Diagnosis not present

## 2021-12-15 DIAGNOSIS — T827XXA Infection and inflammatory reaction due to other cardiac and vascular devices, implants and grafts, initial encounter: Secondary | ICD-10-CM | POA: Diagnosis not present

## 2021-12-15 DIAGNOSIS — M2550 Pain in unspecified joint: Secondary | ICD-10-CM | POA: Diagnosis not present

## 2021-12-15 DIAGNOSIS — M109 Gout, unspecified: Secondary | ICD-10-CM | POA: Diagnosis present

## 2021-12-15 DIAGNOSIS — I318 Other specified diseases of pericardium: Secondary | ICD-10-CM | POA: Diagnosis not present

## 2021-12-15 DIAGNOSIS — M549 Dorsalgia, unspecified: Secondary | ICD-10-CM | POA: Diagnosis present

## 2021-12-15 DIAGNOSIS — I517 Cardiomegaly: Secondary | ICD-10-CM | POA: Diagnosis not present

## 2021-12-15 DIAGNOSIS — I10 Essential (primary) hypertension: Secondary | ICD-10-CM | POA: Diagnosis not present

## 2021-12-15 DIAGNOSIS — M4647 Discitis, unspecified, lumbosacral region: Secondary | ICD-10-CM | POA: Diagnosis present

## 2021-12-15 DIAGNOSIS — B9562 Methicillin resistant Staphylococcus aureus infection as the cause of diseases classified elsewhere: Secondary | ICD-10-CM | POA: Diagnosis not present

## 2021-12-15 DIAGNOSIS — I38 Endocarditis, valve unspecified: Secondary | ICD-10-CM | POA: Diagnosis not present

## 2021-12-15 DIAGNOSIS — M4627 Osteomyelitis of vertebra, lumbosacral region: Secondary | ICD-10-CM | POA: Diagnosis present

## 2021-12-15 DIAGNOSIS — Z7401 Bed confinement status: Secondary | ICD-10-CM | POA: Diagnosis not present

## 2021-12-15 DIAGNOSIS — I4891 Unspecified atrial fibrillation: Secondary | ICD-10-CM | POA: Diagnosis not present

## 2021-12-15 DIAGNOSIS — M533 Sacrococcygeal disorders, not elsewhere classified: Secondary | ICD-10-CM | POA: Insufficient documentation

## 2021-12-15 DIAGNOSIS — D638 Anemia in other chronic diseases classified elsewhere: Secondary | ICD-10-CM | POA: Diagnosis not present

## 2021-12-15 DIAGNOSIS — A419 Sepsis, unspecified organism: Secondary | ICD-10-CM | POA: Diagnosis not present

## 2021-12-15 DIAGNOSIS — Z79899 Other long term (current) drug therapy: Secondary | ICD-10-CM | POA: Diagnosis not present

## 2021-12-15 DIAGNOSIS — R001 Bradycardia, unspecified: Secondary | ICD-10-CM | POA: Diagnosis not present

## 2021-12-15 DIAGNOSIS — A4902 Methicillin resistant Staphylococcus aureus infection, unspecified site: Secondary | ICD-10-CM | POA: Diagnosis not present

## 2021-12-15 DIAGNOSIS — K8020 Calculus of gallbladder without cholecystitis without obstruction: Secondary | ICD-10-CM | POA: Diagnosis not present

## 2021-12-15 DIAGNOSIS — N2581 Secondary hyperparathyroidism of renal origin: Secondary | ICD-10-CM | POA: Diagnosis present

## 2021-12-15 DIAGNOSIS — Z20822 Contact with and (suspected) exposure to covid-19: Secondary | ICD-10-CM | POA: Diagnosis present

## 2021-12-15 DIAGNOSIS — N250 Renal osteodystrophy: Secondary | ICD-10-CM | POA: Diagnosis not present

## 2021-12-15 DIAGNOSIS — E1169 Type 2 diabetes mellitus with other specified complication: Secondary | ICD-10-CM | POA: Diagnosis present

## 2021-12-15 DIAGNOSIS — R911 Solitary pulmonary nodule: Secondary | ICD-10-CM | POA: Diagnosis not present

## 2021-12-15 DIAGNOSIS — N281 Cyst of kidney, acquired: Secondary | ICD-10-CM | POA: Diagnosis not present

## 2021-12-15 DIAGNOSIS — T82838A Hemorrhage of vascular prosthetic devices, implants and grafts, initial encounter: Secondary | ICD-10-CM | POA: Diagnosis not present

## 2021-12-15 DIAGNOSIS — J4520 Mild intermittent asthma, uncomplicated: Secondary | ICD-10-CM | POA: Diagnosis present

## 2021-12-15 DIAGNOSIS — M5126 Other intervertebral disc displacement, lumbar region: Secondary | ICD-10-CM | POA: Diagnosis not present

## 2021-12-15 DIAGNOSIS — D72829 Elevated white blood cell count, unspecified: Secondary | ICD-10-CM | POA: Diagnosis not present

## 2021-12-15 DIAGNOSIS — R079 Chest pain, unspecified: Secondary | ICD-10-CM | POA: Diagnosis not present

## 2021-12-15 DIAGNOSIS — E872 Acidosis, unspecified: Secondary | ICD-10-CM | POA: Diagnosis not present

## 2021-12-15 DIAGNOSIS — Z452 Encounter for adjustment and management of vascular access device: Secondary | ICD-10-CM | POA: Diagnosis not present

## 2021-12-15 DIAGNOSIS — A4102 Sepsis due to Methicillin resistant Staphylococcus aureus: Secondary | ICD-10-CM | POA: Diagnosis present

## 2021-12-15 DIAGNOSIS — B958 Unspecified staphylococcus as the cause of diseases classified elsewhere: Secondary | ICD-10-CM | POA: Diagnosis not present

## 2021-12-15 DIAGNOSIS — R6520 Severe sepsis without septic shock: Secondary | ICD-10-CM | POA: Diagnosis not present

## 2021-12-15 DIAGNOSIS — I480 Paroxysmal atrial fibrillation: Secondary | ICD-10-CM | POA: Diagnosis not present

## 2021-12-15 DIAGNOSIS — M47816 Spondylosis without myelopathy or radiculopathy, lumbar region: Secondary | ICD-10-CM | POA: Diagnosis not present

## 2021-12-15 DIAGNOSIS — J45998 Other asthma: Secondary | ICD-10-CM | POA: Diagnosis not present

## 2021-12-15 DIAGNOSIS — I330 Acute and subacute infective endocarditis: Secondary | ICD-10-CM | POA: Diagnosis present

## 2021-12-15 DIAGNOSIS — R918 Other nonspecific abnormal finding of lung field: Secondary | ICD-10-CM | POA: Diagnosis not present

## 2021-12-15 DIAGNOSIS — R7881 Bacteremia: Secondary | ICD-10-CM | POA: Diagnosis not present

## 2021-12-15 DIAGNOSIS — M545 Low back pain, unspecified: Secondary | ICD-10-CM | POA: Diagnosis not present

## 2021-12-15 DIAGNOSIS — Y841 Kidney dialysis as the cause of abnormal reaction of the patient, or of later complication, without mention of misadventure at the time of the procedure: Secondary | ICD-10-CM | POA: Diagnosis not present

## 2021-12-15 DIAGNOSIS — I4892 Unspecified atrial flutter: Secondary | ICD-10-CM | POA: Diagnosis not present

## 2021-12-15 DIAGNOSIS — I959 Hypotension, unspecified: Secondary | ICD-10-CM | POA: Diagnosis not present

## 2021-12-15 DIAGNOSIS — Z8701 Personal history of pneumonia (recurrent): Secondary | ICD-10-CM | POA: Diagnosis not present

## 2021-12-15 DIAGNOSIS — R109 Unspecified abdominal pain: Secondary | ICD-10-CM | POA: Diagnosis not present

## 2021-12-15 DIAGNOSIS — I120 Hypertensive chronic kidney disease with stage 5 chronic kidney disease or end stage renal disease: Secondary | ICD-10-CM | POA: Diagnosis present

## 2021-12-15 DIAGNOSIS — Z992 Dependence on renal dialysis: Secondary | ICD-10-CM | POA: Diagnosis not present

## 2021-12-15 DIAGNOSIS — D62 Acute posthemorrhagic anemia: Secondary | ICD-10-CM | POA: Diagnosis present

## 2021-12-15 DIAGNOSIS — E1122 Type 2 diabetes mellitus with diabetic chronic kidney disease: Secondary | ICD-10-CM | POA: Diagnosis present

## 2021-12-15 DIAGNOSIS — D631 Anemia in chronic kidney disease: Secondary | ICD-10-CM | POA: Diagnosis present

## 2021-12-15 DIAGNOSIS — E871 Hypo-osmolality and hyponatremia: Secondary | ICD-10-CM | POA: Diagnosis present

## 2021-12-15 DIAGNOSIS — Z8249 Family history of ischemic heart disease and other diseases of the circulatory system: Secondary | ICD-10-CM | POA: Diagnosis not present

## 2021-12-15 DIAGNOSIS — Z8616 Personal history of COVID-19: Secondary | ICD-10-CM | POA: Diagnosis not present

## 2021-12-15 NOTE — ED Notes (Signed)
Pt did not respond when called for vitals X3 with final call

## 2021-12-15 NOTE — ED Provider Triage Note (Signed)
Emergency Medicine Provider Triage Evaluation Note  Diamond [name] , a 71 y.o. female  was evaluated in triage.  Pt complains of back pain/tailbone pain. Due to this she has missed dialysis.  Additionally she c/o pain to the right thumb. She feels this is similar to prior episodes of gout. Takes allopurinol.   Review of Systems  Positive: Back pain, thumb pain Negative: Trauma, numbness  Physical Exam  BP 122/83 (BP Location: Left Arm)    Pulse 77    Temp 99.5 F (37.5 C) (Oral)    Resp 18    SpO2 100%  Gen:   Awake, no distress   Resp:  Normal effort  MSK:   Moves extremities without difficulty  Other:  Midline lumbar ttp with ttp noted to the bilat paraspinous muscles  Medical Decision Making  Medically screening exam initiated at 3:30 PM.  Appropriate orders placed.  Diamond [name] was informed that the remainder of the evaluation will be completed by another provider, this initial triage assessment does not replace that evaluation, and the importance of remaining in the ED until their evaluation is complete.     Rodney Booze, [place] 12/15/21 1535

## 2021-12-15 NOTE — ED Notes (Signed)
Pt called for vitals, no response. 

## 2021-12-15 NOTE — ED Triage Notes (Signed)
[name] [facility] from home for evaluation of back pain that has prevented her from being able to go to dialysis, patient states she has missed two consecutive treatments due to back pain. Patient alert, oriented, and in no apparent distress at this time.

## 2021-12-16 ENCOUNTER — Encounter (HOSPITAL_COMMUNITY): Payer: Self-pay | Admitting: Emergency Medicine

## 2021-12-16 ENCOUNTER — Other Ambulatory Visit: Payer: Self-pay

## 2021-12-16 ENCOUNTER — Inpatient Hospital Stay (HOSPITAL_COMMUNITY)
Admission: EM | Admit: 2021-12-16 | Discharge: 2021-12-30 | DRG: 853 | Disposition: A | Discharge status: Skilled Nursing Facility | Payer: Medicare HMO | Attending: Internal Medicine | Admitting: Internal Medicine

## 2021-12-16 DIAGNOSIS — A4102 Sepsis due to Methicillin resistant Staphylococcus aureus: Principal | ICD-10-CM | POA: Diagnosis present

## 2021-12-16 DIAGNOSIS — Z8616 Personal history of COVID-19: Secondary | ICD-10-CM

## 2021-12-16 DIAGNOSIS — Y841 Kidney dialysis as the cause of abnormal reaction of the patient, or of later complication, without mention of misadventure at the time of the procedure: Secondary | ICD-10-CM | POA: Diagnosis not present

## 2021-12-16 DIAGNOSIS — Z95828 Presence of other vascular implants and grafts: Secondary | ICD-10-CM

## 2021-12-16 DIAGNOSIS — I330 Acute and subacute infective endocarditis: Secondary | ICD-10-CM | POA: Diagnosis present

## 2021-12-16 DIAGNOSIS — R5383 Other fatigue: Secondary | ICD-10-CM

## 2021-12-16 DIAGNOSIS — I480 Paroxysmal atrial fibrillation: Secondary | ICD-10-CM | POA: Diagnosis not present

## 2021-12-16 DIAGNOSIS — I120 Hypertensive chronic kidney disease with stage 5 chronic kidney disease or end stage renal disease: Secondary | ICD-10-CM | POA: Diagnosis present

## 2021-12-16 DIAGNOSIS — D62 Acute posthemorrhagic anemia: Secondary | ICD-10-CM | POA: Diagnosis present

## 2021-12-16 DIAGNOSIS — Z800 Family history of malignant neoplasm of digestive organs: Secondary | ICD-10-CM

## 2021-12-16 DIAGNOSIS — Z8701 Personal history of pneumonia (recurrent): Secondary | ICD-10-CM

## 2021-12-16 DIAGNOSIS — B9562 Methicillin resistant Staphylococcus aureus infection as the cause of diseases classified elsewhere: Secondary | ICD-10-CM | POA: Diagnosis present

## 2021-12-16 DIAGNOSIS — Z79899 Other long term (current) drug therapy: Secondary | ICD-10-CM

## 2021-12-16 DIAGNOSIS — E1169 Type 2 diabetes mellitus with other specified complication: Secondary | ICD-10-CM | POA: Diagnosis present

## 2021-12-16 DIAGNOSIS — E871 Hypo-osmolality and hyponatremia: Secondary | ICD-10-CM | POA: Diagnosis present

## 2021-12-16 DIAGNOSIS — K5900 Constipation, unspecified: Secondary | ICD-10-CM | POA: Diagnosis not present

## 2021-12-16 DIAGNOSIS — M545 Low back pain, unspecified: Secondary | ICD-10-CM | POA: Diagnosis present

## 2021-12-16 DIAGNOSIS — D72829 Elevated white blood cell count, unspecified: Secondary | ICD-10-CM | POA: Diagnosis present

## 2021-12-16 DIAGNOSIS — R6520 Severe sepsis without septic shock: Secondary | ICD-10-CM | POA: Diagnosis present

## 2021-12-16 DIAGNOSIS — R911 Solitary pulmonary nodule: Secondary | ICD-10-CM | POA: Diagnosis present

## 2021-12-16 DIAGNOSIS — D631 Anemia in chronic kidney disease: Secondary | ICD-10-CM | POA: Diagnosis present

## 2021-12-16 DIAGNOSIS — N2581 Secondary hyperparathyroidism of renal origin: Secondary | ICD-10-CM | POA: Diagnosis present

## 2021-12-16 DIAGNOSIS — I5189 Other ill-defined heart diseases: Secondary | ICD-10-CM

## 2021-12-16 DIAGNOSIS — E1122 Type 2 diabetes mellitus with diabetic chronic kidney disease: Secondary | ICD-10-CM | POA: Diagnosis present

## 2021-12-16 DIAGNOSIS — Z992 Dependence on renal dialysis: Secondary | ICD-10-CM

## 2021-12-16 DIAGNOSIS — M4647 Discitis, unspecified, lumbosacral region: Secondary | ICD-10-CM | POA: Diagnosis present

## 2021-12-16 DIAGNOSIS — Z8249 Family history of ischemic heart disease and other diseases of the circulatory system: Secondary | ICD-10-CM

## 2021-12-16 DIAGNOSIS — T82838A Hemorrhage of vascular prosthetic devices, implants and grafts, initial encounter: Secondary | ICD-10-CM | POA: Diagnosis not present

## 2021-12-16 DIAGNOSIS — M109 Gout, unspecified: Secondary | ICD-10-CM | POA: Diagnosis present

## 2021-12-16 DIAGNOSIS — M4627 Osteomyelitis of vertebra, lumbosacral region: Secondary | ICD-10-CM | POA: Diagnosis present

## 2021-12-16 DIAGNOSIS — E872 Acidosis, unspecified: Secondary | ICD-10-CM

## 2021-12-16 DIAGNOSIS — A4101 Sepsis due to Methicillin susceptible Staphylococcus aureus: Secondary | ICD-10-CM | POA: Diagnosis present

## 2021-12-16 DIAGNOSIS — Z20822 Contact with and (suspected) exposure to covid-19: Secondary | ICD-10-CM | POA: Diagnosis present

## 2021-12-16 DIAGNOSIS — D649 Anemia, unspecified: Secondary | ICD-10-CM | POA: Diagnosis present

## 2021-12-16 DIAGNOSIS — I10 Essential (primary) hypertension: Secondary | ICD-10-CM | POA: Diagnosis present

## 2021-12-16 DIAGNOSIS — J4520 Mild intermittent asthma, uncomplicated: Secondary | ICD-10-CM | POA: Diagnosis present

## 2021-12-16 DIAGNOSIS — M549 Dorsalgia, unspecified: Secondary | ICD-10-CM

## 2021-12-16 DIAGNOSIS — M4620 Osteomyelitis of vertebra, site unspecified: Secondary | ICD-10-CM | POA: Diagnosis present

## 2021-12-16 DIAGNOSIS — Z801 Family history of malignant neoplasm of trachea, bronchus and lung: Secondary | ICD-10-CM

## 2021-12-16 DIAGNOSIS — I4892 Unspecified atrial flutter: Secondary | ICD-10-CM | POA: Diagnosis not present

## 2021-12-16 DIAGNOSIS — I513 Intracardiac thrombosis, not elsewhere classified: Secondary | ICD-10-CM | POA: Diagnosis present

## 2021-12-16 DIAGNOSIS — I959 Hypotension, unspecified: Secondary | ICD-10-CM

## 2021-12-16 DIAGNOSIS — D638 Anemia in other chronic diseases classified elsewhere: Secondary | ICD-10-CM | POA: Diagnosis present

## 2021-12-16 DIAGNOSIS — N186 End stage renal disease: Secondary | ICD-10-CM | POA: Diagnosis present

## 2021-12-16 DIAGNOSIS — A419 Sepsis, unspecified organism: Secondary | ICD-10-CM

## 2021-12-16 LAB — CBC WITH DIFFERENTIAL/PLATELET
Abs Immature Granulocytes: 0.16 10*3/uL — ABNORMAL HIGH (ref 0.00–0.07)
Basophils Absolute: 0.1 10*3/uL (ref 0.0–0.1)
Basophils Relative: 0 %
Eosinophils Absolute: 0 10*3/uL (ref 0.0–0.5)
Eosinophils Relative: 0 %
HCT: 39 % (ref 36.0–46.0)
Hemoglobin: 12.5 g/dL (ref 12.0–15.0)
Immature Granulocytes: 1 %
Lymphocytes Relative: 3 %
Lymphs Abs: 0.6 10*3/uL — ABNORMAL LOW (ref 0.7–4.0)
MCH: 31.3 pg (ref 26.0–34.0)
MCHC: 32.1 g/dL (ref 30.0–36.0)
MCV: 97.5 fL (ref 80.0–100.0)
Monocytes Absolute: 0.6 10*3/uL (ref 0.1–1.0)
Monocytes Relative: 3 %
Neutro Abs: 19 10*3/uL — ABNORMAL HIGH (ref 1.7–7.7)
Neutrophils Relative %: 93 %
Platelets: 210 10*3/uL (ref 150–400)
RBC: 4 MIL/uL (ref 3.87–5.11)
RDW: 17.1 % — ABNORMAL HIGH (ref 11.5–15.5)
WBC: 20.5 10*3/uL — ABNORMAL HIGH (ref 4.0–10.5)
nRBC: 0 % (ref 0.0–0.2)

## 2021-12-16 LAB — COMPREHENSIVE METABOLIC PANEL
ALT: 10 U/L (ref 0–44)
AST: 15 U/L (ref 15–41)
Albumin: 3 g/dL — ABNORMAL LOW (ref 3.5–5.0)
Alkaline Phosphatase: 71 U/L (ref 38–126)
Anion gap: 18 — ABNORMAL HIGH (ref 5–15)
BUN: 73 mg/dL — ABNORMAL HIGH (ref 8–23)
CO2: 15 mmol/L — ABNORMAL LOW (ref 22–32)
Calcium: 9.9 mg/dL (ref 8.9–10.3)
Chloride: 97 mmol/L — ABNORMAL LOW (ref 98–111)
Creatinine, Ser: 9.73 mg/dL — ABNORMAL HIGH (ref 0.44–1.00)
GFR, Estimated: 4 mL/min — ABNORMAL LOW (ref >60)
Glucose, Bld: 102 mg/dL — ABNORMAL HIGH (ref 70–99)
Potassium: 4.5 mmol/L (ref 3.5–5.1)
Sodium: 130 mmol/L — ABNORMAL LOW (ref 135–145)
Total Bilirubin: 1.1 mg/dL (ref 0.3–1.2)
Total Protein: 7.2 g/dL (ref 6.5–8.1)

## 2021-12-16 LAB — RESP PANEL BY RT-PCR (FLU A&B, COVID) ARPGX2
Influenza A by PCR: NEGATIVE
Influenza B by PCR: NEGATIVE
SARS Coronavirus 2 by RT PCR: NEGATIVE

## 2021-12-16 MED ORDER — ACETAMINOPHEN 500 MG PO TABS
500.0000 mg | ORAL_TABLET | Freq: Once | ORAL | Status: AC
  Administered 2021-12-16: 500 mg via ORAL
  Filled 2021-12-16: qty 1

## 2021-12-16 NOTE — ED Provider Notes (Signed)
Emergency Department Provider Note   I have reviewed the triage vital signs and the nursing notes.   HISTORY  Chief Complaint Back Pain   HPI Diamond [name] is a 71 y.o. female with PMH of ESRD on HD presents to the ED with right sided back pain and pain in the bilateral LEs. Patient has a history of gout and reports taking Allopurinol to prevent flares. She was treating a recent outbreak in the left leg which has resolved.  Patient states that she is feeling pain in the right back.  She does continue to make urine and denies any dysuria, hesitancy, urgency.  She is not having any abdominal pain or chest discomfort.  No upper respiratory infection symptoms.  She denies any rashes or lesions/ulcers.    Past Medical History:  Diagnosis Date   Asthma    Chronic kidney disease    dialysis Tues Thurs Sat   Dysrhythmia    PAF 10/2019 in setting of COVID-19 PNA   Gout    Hypertension     Review of Systems  Constitutional: No fever/chills Eyes: No visual changes. ENT: No sore throat. Cardiovascular: Denies chest pain. Respiratory: Denies shortness of breath. Gastrointestinal: No abdominal pain.   Genitourinary: Negative for dysuria. Musculoskeletal: Positive for back pain. Skin: Negative for rash. Neurological: Negative for headaches, focal weakness or numbness.  10-point ROS otherwise negative.  ____________________________________________   PHYSICAL EXAM:  VITAL SIGNS: ED Triage Vitals  Enc Vitals Group     BP 12/16/21 1332 115/86     Pulse Rate 12/16/21 1332 72     Resp 12/16/21 1332 16     Temp 12/16/21 1332 98.1 F (36.7 C)     Temp Source 12/16/21 1332 Oral     SpO2 12/16/21 1332 100 %     Weight 12/16/21 1437 152 lb (68.9 kg)     Height 12/16/21 1437 5\' 7"  (1.702 m)   Constitutional: Alert and oriented. Well appearing and in no acute distress. Eyes: Conjunctivae are normal.  Head: Atraumatic. Nose: No congestion/rhinnorhea. Mouth/Throat: Mucous  membranes are moist.  Neck: No stridor.   Cardiovascular: Normal rate, regular rhythm. Good peripheral circulation. Grossly normal heart sounds. 2+ pedal pulses bilaterally.  Respiratory: Normal respiratory effort.  No retractions. Lungs CTAB. Gastrointestinal: Soft and nontender. No distention.  Musculoskeletal: No lower extremity tenderness nor edema. No gross deformities of extremities. Neurologic:  Normal speech and language. No gross focal neurologic deficits are appreciated.  Skin:  Skin is warm, dry and intact. No rash noted. No foot wounds or swollen joints.   ____________________________________________   LABS (all labs ordered are listed, but only abnormal results are displayed)  Labs Reviewed  CBC WITH DIFFERENTIAL/PLATELET - Abnormal; Notable for the following components:      Result Value   WBC 20.5 (*)    RDW 17.1 (*)    Neutro Abs 19.0 (*)    Lymphs Abs 0.6 (*)    Abs Immature Granulocytes 0.16 (*)    All other components within normal limits  COMPREHENSIVE METABOLIC PANEL - Abnormal; Notable for the following components:   Sodium 130 (*)    Chloride 97 (*)    CO2 15 (*)    Glucose, Bld 102 (*)    BUN 73 (*)    Creatinine, Ser 9.73 (*)    Albumin 3.0 (*)    GFR, Estimated 4 (*)    Anion gap 18 (*)    All other components within normal limits  LACTIC  ACID, PLASMA - Abnormal; Notable for the following components:   Lactic Acid, Venous 2.1 (*)    All other components within normal limits  LACTIC ACID, PLASMA - Abnormal; Notable for the following components:   Lactic Acid, Venous 2.7 (*)    All other components within normal limits  RESP PANEL BY RT-PCR (FLU A&B, COVID) ARPGX2  URINE CULTURE  CULTURE, BLOOD (ROUTINE X 2)  CULTURE, BLOOD (ROUTINE X 2)  URINALYSIS, ROUTINE W REFLEX MICROSCOPIC  MAGNESIUM  PHOSPHORUS  COMPREHENSIVE METABOLIC PANEL  CBC WITH DIFFERENTIAL/PLATELET  RAPID URINE DRUG SCREEN, HOSP PERFORMED  C-REACTIVE PROTEIN  SEDIMENTATION RATE   PROCALCITONIN   ____________________________________________  RADIOLOGY  DG Chest 2 View  Result Date: 12/17/2021 CLINICAL DATA:  Short S of breath, back pain EXAM: CHEST - 2 VIEW COMPARISON:  11/05/2019 FINDINGS: Right dialysis catheter tip in the right atrium. Mild cardiomegaly. No confluent opacities, effusions or edema. No acute bony abnormality. IMPRESSION: Mild cardiomegaly.  No active disease. Electronically Signed   By: Rolm Baptise M.D.   On: 12/17/2021 01:54   CT Renal Stone Study  Result Date: 12/17/2021 CLINICAL DATA:  Dialysis patient with flank pain. Kidney stone suspected. EXAM: CT ABDOMEN AND PELVIS WITHOUT CONTRAST TECHNIQUE: Multidetector CT imaging of the abdomen and pelvis was performed following the standard protocol without IV contrast. RADIATION DOSE REDUCTION: This exam was performed according to the departmental dose-optimization program which includes automated exposure control, adjustment of the mA and/or kV according to patient size and/or use of iterative reconstruction technique. COMPARISON:  CT without contrast 10/01/2021. FINDINGS: Lower chest: Above plane of the prior study, there is an ovoid well-circumscribed low-density right middle lobe 10.6 x 7.0 mm nodule laterally on axial image 3 of series 4, with pleural stranding. This was not present on prior chest CT of 10/15/2010 therefore is indeterminate in significance. There are few linear scar-like opacities in the lung bases but no active infiltrates. Cardiac size is normal. There are coronary artery calcifications. No pericardial fluid. Hepatobiliary: As previously there are air containing cholesterol stones in the gallbladder up to 2.5 cm in diameter. Equivocal mild thickening of the fundal wall but no pericholecystic edema or fluid is seen and no biliary dilatation. No focal abnormality of unenhanced liver. Pancreas: Unremarkable without contrast.  No adjacent edema. Spleen: Unremarkable without contrast apart from  calcified granulomas. Adrenals/Urinary Tract: There is no adrenal mass. There are small renal cysts. Benign cortical calcification of the lower lateral right kidney is again noted. No intrarenal stone is seen and no ureteral stones or hydronephrosis. Mild bladder thickening versus underdistention. Stomach/Bowel: No dilatation or wall thickening including the appendix. There is fluid in the colon in the ascending and transverse segments. Vascular/Lymphatic: Aortic atherosclerosis. No enlarged abdominal or pelvic lymph nodes. Reproductive: The uterus is intact. There are vascular calcifications in the outer wall. The ovaries are not enlarged. Other: There is no free air, hemorrhage or fluid. Small umbilical fat hernia. Musculoskeletal: Transitional anatomy at L5 with sacralization. Osteopenia and degenerative changes are noted with reactive endplate sclerosis at Z2-2, chronic disc collapse. At L2, there previously appeared to be a nondisplaced longitudinal fracture through the anterior column of the vertebral body, which appears to have largely healed in the interval but with prominent endplate Schmorl's node in the superior vertebral body now seen. There is only a slight loss of the anterior vertebral height at this level. Reactive sclerosis in the L2 vertebral body is consistent with a healing response. IMPRESSION: 1. No urinary  stones or obstruction on the current and prior studies. 2. Cholelithiasis with equivocal thickening of the fundal wall but no pericholecystic edema or fluid. This was also seen previously. If there are localizing symptoms, ultrasound may be helpful. 3. Cystitis versus bladder nondistention. 4. Fluid in the ascending and transverse colon which could be due to a diarrheal enteritis. No wall thickening or inflammatory change. 5. Previous study with suspected longitudinal nondisplaced fracture of the anterior column of L2. The fracture line is no longer visible. There is a prominent upper plate  Schmorl's node now seen and only slight loss of anterior vertebral body height, without retropulsion. Marrow sclerosis in the vertebral body is consistent with healing response. 6. Aortic atherosclerosis. 7. 10.6 x 7.0 mm well-circumscribed oval right middle lobe nodule above the plane of the recent prior abdomen and pelvis CT and not seen on a 2011 chest CT. Three-month follow-up CT or PET-CT recommended. Electronically Signed   By: Telford Nab M.D.   On: 12/17/2021 00:32   US Abdomen Limited RUQ (LIVER/GB)  Result Date: 12/17/2021 CLINICAL DATA:  Back pain EXAM: ULTRASOUND ABDOMEN LIMITED RIGHT UPPER QUADRANT COMPARISON:  CT earlier today FINDINGS: Gallbladder: Multiple stones fill the gallbladder, measuring up to 2.3 cm. Gallbladder wall is borderline in thickness at 3 mm. Negative sonographic Murphy's. Common bile duct: Diameter: Mildly dilated at 9 mm, tapering distally. Liver: No focal lesion identified. Within normal limits in parenchymal echogenicity. Portal vein is patent on color Doppler imaging with normal direction of blood flow towards the liver. Other: None. IMPRESSION: Cholelithiasis. No convincing sonographic evidence of acute cholecystitis. Common bile duct is mildly dilated proximally, then tapers distally. Recommend correlation with LFTs. Electronically Signed   By: Rolm Baptise M.D.   On: 12/17/2021 01:35    ____________________________________________   PROCEDURES  Procedure(s) performed:   Procedures  CRITICAL CARE Performed by: Margette Fast Total critical care time: 35 minutes Critical care time was exclusive of separately billable procedures and treating other patients. Critical care was necessary to treat or prevent imminent or life-threatening deterioration. Critical care was time spent personally by me on the following activities: development of treatment plan with patient and/or surrogate as well as nursing, discussions with consultants, evaluation of patient's  response to treatment, examination of patient, obtaining history from patient or surrogate, ordering and performing treatments and interventions, ordering and review of laboratory studies, ordering and review of radiographic studies, pulse oximetry and re-evaluation of patient's condition.  Nanda Quinton, MD Emergency Medicine  ____________________________________________   INITIAL IMPRESSION / ASSESSMENT AND PLAN / ED COURSE  Pertinent labs & imaging results that were available during my care of the patient were reviewed by me and considered in my medical decision making (see chart for details).   This patient is Presenting for Evaluation of back pain, which does require a range of treatment options, and is a complaint that involves a high risk of morbidity and mortality.  The Differential Diagnoses includes but is not exclusive to musculoskeletal back pain, renal colic, urinary tract infection, pyelonephritis, intra-abdominal causes of back pain, aortic aneurysm or dissection, cauda equina syndrome, sciatica, lumbar disc disease, thoracic disc disease, etc.   Critical Interventions- pain mgmt and IVF with elevated lactate.    Reassessment after intervention: Tachycardia improved and patient is more comfortable.   I decided to review pertinent External Data, and in summary patient evaluated in the ED previously with back pain. No recent history of sepsis or developing infection.   Clinical Laboratory Tests  Ordered, included CBC, CMP, and lactic acid. Patient with leukocytosis to 20 with lactic acid of 2.1. COVID and flu negative. ESRD with normal potassium.   Radiologic Tests Ordered, included CXR, CT abdomen w/o contrast, and RUQ [place]. I independently evaluated the above imaging studies and agree with radiology interpretation.   Cardiac Monitor Tracing which shows NSR.    Reevaluation with update and discussion with patient. Plan for admit with concern for developing sepsis although no  clear source of infection at this time. Blood cultures sent and abx started. No indication for emergent HD at this time.   Consult complete with Hospitalist.   Discussed patient's case with [facility], Dr. Velia Meyer to request admission. Patient and family (if present) updated with plan. Care transferred to [facility] service.  I reviewed all nursing notes, vitals, pertinent old records, EKGs, labs, imaging (as available).  Disposition: Admit  ____________________________________________  FINAL CLINICAL IMPRESSION(S) / ED DIAGNOSES  Final diagnoses:  Back pain  Lactic acidosis     MEDICATIONS GIVEN DURING THIS VISIT:  Medications  metroNIDAZOLE (FLAGYL) IVPB 500 mg (500 mg Intravenous New Bag/Given 12/17/21 0313)  vancomycin (VANCOREADY) IVPB 1500 mg/300 mL (1,500 mg Intravenous New Bag/Given 12/17/21 0314)  fentaNYL (SUBLIMAZE) injection 50 mcg (has no administration in time range)  acetaminophen (TYLENOL) tablet 650 mg (has no administration in time range)    Or  acetaminophen (TYLENOL) suppository 650 mg (has no administration in time range)  cefTRIAXone (ROCEPHIN) 1 g in sodium chloride 0.9 % 100 mL IVPB (has no administration in time range)  lactated ringers infusion (has no administration in time range)  albuterol (PROVENTIL) (2.5 MG/3ML) 0.083% nebulizer solution 2.5 mg (has no administration in time range)  verapamil (CALAN) tablet 120 mg (has no administration in time range)  acetaminophen (TYLENOL) tablet 500 mg (500 mg Oral Given 12/16/21 1657)  sodium chloride 0.9 % bolus 500 mL (500 mLs Intravenous New Bag/Given 12/17/21 0214)  ceFEPIme (MAXIPIME) 2 g in sodium chloride 0.9 % 100 mL IVPB (0 g Intravenous Stopped 12/17/21 0315)    Note:  This document was prepared using Dragon voice recognition software and may include unintentional dictation errors.  Nanda Quinton, MD, [facility] Emergency Medicine    Tajah Noguchi, [facility], MD 12/17/21 0400

## 2021-12-16 NOTE — ED Provider Triage Note (Signed)
Emergency Medicine Provider Triage Evaluation Note  Diamond [name] , a 71 y.o. female  was evaluated in triage.  Pt complains of low back pain onset 3 days.  Patient is a dialysis patient with her last session being on 12/08/2020, she typically goes on Tuesday, Thursday, Saturday.  She notes that she was unable to go to dialysis due to pain in her toes/feet/ankles.  She has not tried any medications for her symptoms.  Denies fever, chills, chest pain, shortness of breath, abdominal pain, nausea, vomiting, bowel/bladder incontinence, saddle paresthesia.  Denies recent injury, trauma, fall.  Review of Systems  Positive: As per HPI above Negative: Chest pain, shortness of breath  Physical Exam  BP 115/86 (BP Location: Left Arm)    Pulse 72    Temp 98.1 F (36.7 C) (Oral)    Resp 16    Ht 5\' 7"  (1.702 m)    Wt 68.9 kg    SpO2 100%    BMI 23.81 kg/m  Gen:   Awake, no distress   Resp:  Normal effort  MSK:   Moves extremities without difficulty  Other:  Port in place to right upper chest wall.  No overlying skin changes.  Negative straight leg raise bilaterally.  Mild lumbar midline ttp. No cervical, thoracic spinal tenderness to palpation.  Mild right lumbar musculature tenderness to palpation.  Medical Decision Making  Medically screening exam initiated at 4:41 PM.  Appropriate orders placed.  Diamond [name] was informed that the remainder of the evaluation will be completed by another provider, this initial triage assessment does not replace that evaluation, and the importance of remaining in the ED until their evaluation is complete.   Joelene Barriere A, PA-C 12/16/21 1651

## 2021-12-16 NOTE — ED Triage Notes (Signed)
Came to the ED yesterday however left without being seen for back pain. States is a dialysis patient and last session was 12/08/20. States could not go other days due to gout pain in toes, feet, and ankles.

## 2021-12-16 NOTE — ED Notes (Signed)
Patients daughter states that patient will not speak up and will need to be changed. When obtaining vitals this writer asked the patient if we could toilet. Patient states "I dont want to go to the bathroom right now." Explained to the patient importance of hygiene and keeping dry patient reiterates that she doesn't have to use bathroom and will say when she wants to go.

## 2021-12-17 ENCOUNTER — Encounter (HOSPITAL_COMMUNITY): Payer: Self-pay | Admitting: Internal Medicine

## 2021-12-17 ENCOUNTER — Other Ambulatory Visit (HOSPITAL_COMMUNITY): Payer: Medicare HMO

## 2021-12-17 ENCOUNTER — Emergency Department (HOSPITAL_COMMUNITY): Payer: Medicare HMO

## 2021-12-17 DIAGNOSIS — M4620 Osteomyelitis of vertebra, site unspecified: Secondary | ICD-10-CM | POA: Diagnosis not present

## 2021-12-17 DIAGNOSIS — Z79899 Other long term (current) drug therapy: Secondary | ICD-10-CM | POA: Diagnosis not present

## 2021-12-17 DIAGNOSIS — I513 Intracardiac thrombosis, not elsewhere classified: Secondary | ICD-10-CM | POA: Diagnosis present

## 2021-12-17 DIAGNOSIS — I4892 Unspecified atrial flutter: Secondary | ICD-10-CM | POA: Diagnosis not present

## 2021-12-17 DIAGNOSIS — N2581 Secondary hyperparathyroidism of renal origin: Secondary | ICD-10-CM | POA: Diagnosis present

## 2021-12-17 DIAGNOSIS — Z8249 Family history of ischemic heart disease and other diseases of the circulatory system: Secondary | ICD-10-CM | POA: Diagnosis not present

## 2021-12-17 DIAGNOSIS — Z8616 Personal history of COVID-19: Secondary | ICD-10-CM | POA: Diagnosis not present

## 2021-12-17 DIAGNOSIS — I38 Endocarditis, valve unspecified: Secondary | ICD-10-CM | POA: Diagnosis not present

## 2021-12-17 DIAGNOSIS — M4627 Osteomyelitis of vertebra, lumbosacral region: Secondary | ICD-10-CM | POA: Diagnosis not present

## 2021-12-17 DIAGNOSIS — I318 Other specified diseases of pericardium: Secondary | ICD-10-CM | POA: Diagnosis not present

## 2021-12-17 DIAGNOSIS — E872 Acidosis, unspecified: Secondary | ICD-10-CM | POA: Diagnosis not present

## 2021-12-17 DIAGNOSIS — M109 Gout, unspecified: Secondary | ICD-10-CM | POA: Diagnosis present

## 2021-12-17 DIAGNOSIS — A419 Sepsis, unspecified organism: Secondary | ICD-10-CM | POA: Diagnosis present

## 2021-12-17 DIAGNOSIS — D638 Anemia in other chronic diseases classified elsewhere: Secondary | ICD-10-CM

## 2021-12-17 DIAGNOSIS — T82838S Hemorrhage of vascular prosthetic devices, implants and grafts, sequela: Secondary | ICD-10-CM | POA: Diagnosis not present

## 2021-12-17 DIAGNOSIS — A4101 Sepsis due to Methicillin susceptible Staphylococcus aureus: Secondary | ICD-10-CM | POA: Diagnosis present

## 2021-12-17 DIAGNOSIS — Z8701 Personal history of pneumonia (recurrent): Secondary | ICD-10-CM | POA: Diagnosis not present

## 2021-12-17 DIAGNOSIS — D62 Acute posthemorrhagic anemia: Secondary | ICD-10-CM | POA: Diagnosis not present

## 2021-12-17 DIAGNOSIS — Y841 Kidney dialysis as the cause of abnormal reaction of the patient, or of later complication, without mention of misadventure at the time of the procedure: Secondary | ICD-10-CM | POA: Diagnosis not present

## 2021-12-17 DIAGNOSIS — R7881 Bacteremia: Secondary | ICD-10-CM | POA: Diagnosis not present

## 2021-12-17 DIAGNOSIS — B958 Unspecified staphylococcus as the cause of diseases classified elsewhere: Secondary | ICD-10-CM | POA: Diagnosis not present

## 2021-12-17 DIAGNOSIS — E1122 Type 2 diabetes mellitus with diabetic chronic kidney disease: Secondary | ICD-10-CM | POA: Diagnosis not present

## 2021-12-17 DIAGNOSIS — T82838A Hemorrhage of vascular prosthetic devices, implants and grafts, initial encounter: Secondary | ICD-10-CM | POA: Diagnosis not present

## 2021-12-17 DIAGNOSIS — T827XXA Infection and inflammatory reaction due to other cardiac and vascular devices, implants and grafts, initial encounter: Secondary | ICD-10-CM | POA: Diagnosis not present

## 2021-12-17 DIAGNOSIS — M545 Low back pain, unspecified: Secondary | ICD-10-CM | POA: Insufficient documentation

## 2021-12-17 DIAGNOSIS — I480 Paroxysmal atrial fibrillation: Secondary | ICD-10-CM | POA: Diagnosis not present

## 2021-12-17 DIAGNOSIS — K5900 Constipation, unspecified: Secondary | ICD-10-CM | POA: Diagnosis not present

## 2021-12-17 DIAGNOSIS — N186 End stage renal disease: Secondary | ICD-10-CM | POA: Diagnosis present

## 2021-12-17 DIAGNOSIS — D631 Anemia in chronic kidney disease: Secondary | ICD-10-CM | POA: Diagnosis not present

## 2021-12-17 DIAGNOSIS — M4647 Discitis, unspecified, lumbosacral region: Secondary | ICD-10-CM | POA: Diagnosis present

## 2021-12-17 DIAGNOSIS — Z20822 Contact with and (suspected) exposure to covid-19: Secondary | ICD-10-CM | POA: Diagnosis not present

## 2021-12-17 DIAGNOSIS — A4102 Sepsis due to Methicillin resistant Staphylococcus aureus: Secondary | ICD-10-CM | POA: Diagnosis not present

## 2021-12-17 DIAGNOSIS — I120 Hypertensive chronic kidney disease with stage 5 chronic kidney disease or end stage renal disease: Secondary | ICD-10-CM | POA: Diagnosis not present

## 2021-12-17 DIAGNOSIS — I10 Essential (primary) hypertension: Secondary | ICD-10-CM

## 2021-12-17 DIAGNOSIS — J4520 Mild intermittent asthma, uncomplicated: Secondary | ICD-10-CM | POA: Diagnosis not present

## 2021-12-17 DIAGNOSIS — D72829 Elevated white blood cell count, unspecified: Secondary | ICD-10-CM | POA: Insufficient documentation

## 2021-12-17 DIAGNOSIS — A4902 Methicillin resistant Staphylococcus aureus infection, unspecified site: Secondary | ICD-10-CM | POA: Diagnosis not present

## 2021-12-17 DIAGNOSIS — B9562 Methicillin resistant Staphylococcus aureus infection as the cause of diseases classified elsewhere: Secondary | ICD-10-CM | POA: Diagnosis not present

## 2021-12-17 DIAGNOSIS — R6520 Severe sepsis without septic shock: Secondary | ICD-10-CM | POA: Diagnosis not present

## 2021-12-17 DIAGNOSIS — I330 Acute and subacute infective endocarditis: Secondary | ICD-10-CM | POA: Diagnosis not present

## 2021-12-17 DIAGNOSIS — E871 Hypo-osmolality and hyponatremia: Secondary | ICD-10-CM | POA: Diagnosis not present

## 2021-12-17 DIAGNOSIS — Z992 Dependence on renal dialysis: Secondary | ICD-10-CM | POA: Diagnosis present

## 2021-12-17 DIAGNOSIS — M549 Dorsalgia, unspecified: Secondary | ICD-10-CM | POA: Diagnosis present

## 2021-12-17 DIAGNOSIS — E1169 Type 2 diabetes mellitus with other specified complication: Secondary | ICD-10-CM | POA: Diagnosis present

## 2021-12-17 LAB — BLOOD CULTURE ID PANEL (REFLEXED) - BCID2

## 2021-12-17 LAB — HEPATIC FUNCTION PANEL
ALT: 7 U/L (ref 0–44)
ALT: 8 U/L (ref 0–44)
AST: 18 U/L (ref 15–41)
AST: 11 U/L — ABNORMAL LOW (ref 15–41)
Albumin: 2.4 g/dL — ABNORMAL LOW (ref 3.5–5.0)
Albumin: 2.5 g/dL — ABNORMAL LOW (ref 3.5–5.0)
Alkaline Phosphatase: 67 U/L (ref 38–126)
Alkaline Phosphatase: 80 U/L (ref 38–126)
Bilirubin, Direct: 0.5 mg/dL — ABNORMAL HIGH (ref 0.0–0.2)
Bilirubin, Direct: 0.3 mg/dL — ABNORMAL HIGH (ref 0.0–0.2)
Indirect Bilirubin: 1.1 mg/dL — ABNORMAL HIGH (ref 0.3–0.9)
Indirect Bilirubin: 0.8 mg/dL (ref 0.3–0.9)
Total Bilirubin: 1.6 mg/dL — ABNORMAL HIGH (ref 0.3–1.2)
Total Bilirubin: 1.1 mg/dL (ref 0.3–1.2)
Total Protein: 5.5 g/dL — ABNORMAL LOW (ref 6.5–8.1)
Total Protein: 6.7 g/dL (ref 6.5–8.1)

## 2021-12-17 LAB — RAPID URINE DRUG SCREEN, HOSP PERFORMED
Amphetamines: NOT DETECTED
Barbiturates: NOT DETECTED
Benzodiazepines: NOT DETECTED
Cocaine: NOT DETECTED
Opiates: NOT DETECTED
Tetrahydrocannabinol: NOT DETECTED

## 2021-12-17 LAB — CBC WITH DIFFERENTIAL/PLATELET
Abs Immature Granulocytes: 0.23 10*3/uL — ABNORMAL HIGH (ref 0.00–0.07)
Basophils Absolute: 0.1 10*3/uL (ref 0.0–0.1)
Basophils Relative: 0 %
Eosinophils Absolute: 0 10*3/uL (ref 0.0–0.5)
Eosinophils Relative: 0 %
HCT: 36 % (ref 36.0–46.0)
Hemoglobin: 11.3 g/dL — ABNORMAL LOW (ref 12.0–15.0)
Immature Granulocytes: 1 %
Lymphocytes Relative: 3 %
Lymphs Abs: 0.7 10*3/uL (ref 0.7–4.0)
MCH: 31.7 pg (ref 26.0–34.0)
MCHC: 31.4 g/dL (ref 30.0–36.0)
MCV: 100.8 fL — ABNORMAL HIGH (ref 80.0–100.0)
Monocytes Absolute: 1.1 10*3/uL — ABNORMAL HIGH (ref 0.1–1.0)
Monocytes Relative: 5 %
Neutro Abs: 18.3 10*3/uL — ABNORMAL HIGH (ref 1.7–7.7)
Neutrophils Relative %: 91 %
Platelets: 153 10*3/uL (ref 150–400)
RBC: 3.57 MIL/uL — ABNORMAL LOW (ref 3.87–5.11)
RDW: 17.7 % — ABNORMAL HIGH (ref 11.5–15.5)
WBC: 20.3 10*3/uL — ABNORMAL HIGH (ref 4.0–10.5)
nRBC: 0 % (ref 0.0–0.2)

## 2021-12-17 LAB — C-REACTIVE PROTEIN: CRP: 29.1 mg/dL — ABNORMAL HIGH (ref <1.0)

## 2021-12-17 LAB — COMPREHENSIVE METABOLIC PANEL
ALT: 8 U/L (ref 0–44)
AST: 16 U/L (ref 15–41)
Albumin: 2.5 g/dL — ABNORMAL LOW (ref 3.5–5.0)
Alkaline Phosphatase: 92 U/L (ref 38–126)
Anion gap: 25 — ABNORMAL HIGH (ref 5–15)
BUN: 80 mg/dL — ABNORMAL HIGH (ref 8–23)
CO2: 14 mmol/L — ABNORMAL LOW (ref 22–32)
Calcium: 8.9 mg/dL (ref 8.9–10.3)
Chloride: 94 mmol/L — ABNORMAL LOW (ref 98–111)
Creatinine, Ser: 9.58 mg/dL — ABNORMAL HIGH (ref 0.44–1.00)
GFR, Estimated: 4 mL/min — ABNORMAL LOW (ref >60)
Glucose, Bld: 82 mg/dL (ref 70–99)
Potassium: 5.4 mmol/L — ABNORMAL HIGH (ref 3.5–5.1)
Sodium: 133 mmol/L — ABNORMAL LOW (ref 135–145)
Total Bilirubin: 1.3 mg/dL — ABNORMAL HIGH (ref 0.3–1.2)
Total Protein: 6.3 g/dL — ABNORMAL LOW (ref 6.5–8.1)

## 2021-12-17 LAB — LACTIC ACID, PLASMA
Lactic Acid, Venous: 2.7 mmol/L (ref 0.5–1.9)
Lactic Acid, Venous: 2.1 mmol/L (ref 0.5–1.9)
Lactic Acid, Venous: 1.8 mmol/L (ref 0.5–1.9)

## 2021-12-17 LAB — URINE CULTURE: Culture: NO GROWTH

## 2021-12-17 LAB — PHOSPHORUS: Phosphorus: 7.4 mg/dL — ABNORMAL HIGH (ref 2.5–4.6)

## 2021-12-17 LAB — PROCALCITONIN: Procalcitonin: 18.38 ng/mL

## 2021-12-17 LAB — MAGNESIUM: Magnesium: 1.9 mg/dL (ref 1.7–2.4)

## 2021-12-17 LAB — SEDIMENTATION RATE: Sed Rate: 64 mm/hr — ABNORMAL HIGH (ref 0–22)

## 2021-12-17 LAB — HEPATITIS B SURFACE ANTIBODY,QUALITATIVE: Hep B S Ab: NONREACTIVE

## 2021-12-17 LAB — HEPATITIS B SURFACE ANTIGEN: Hepatitis B Surface Ag: NONREACTIVE

## 2021-12-17 MED ORDER — LACTATED RINGERS IV SOLN: INTRAVENOUS | Status: AC

## 2021-12-17 MED ORDER — VANCOMYCIN HCL IN DEXTROSE 1-5 GM/200ML-% IV SOLN: 1000.0000 mg | Freq: Once | INTRAVENOUS | Status: DC

## 2021-12-17 MED ORDER — ACETAMINOPHEN 325 MG PO TABS
650.0000 mg | ORAL_TABLET | Freq: Four times a day (QID) | ORAL | Status: DC | PRN
  Administered 2021-12-17 – 2021-12-22 (×7): 650 mg via ORAL
  Filled 2021-12-17 (×7): qty 2

## 2021-12-17 MED ORDER — HEPARIN SODIUM (PORCINE) 1000 UNIT/ML DIALYSIS
2000.0000 [IU] | Freq: Once | INTRAMUSCULAR | Status: AC
  Administered 2021-12-17: 2000 [IU] via INTRAVENOUS_CENTRAL
  Filled 2021-12-17 (×2): qty 2

## 2021-12-17 MED ORDER — SODIUM CHLORIDE 0.9 % IV BOLUS
500.0000 mL | Freq: Once | INTRAVENOUS | Status: AC
  Administered 2021-12-17: 500 mL via INTRAVENOUS

## 2021-12-17 MED ORDER — VERAPAMIL HCL 120 MG PO TABS
120.0000 mg | ORAL_TABLET | Freq: Three times a day (TID) | ORAL | Status: DC
  Administered 2021-12-17 – 2021-12-25 (×20): 120 mg via ORAL
  Filled 2021-12-17 (×32): qty 1

## 2021-12-17 MED ORDER — FENTANYL CITRATE PF 50 MCG/ML IJ SOSY
50.0000 ug | PREFILLED_SYRINGE | INTRAMUSCULAR | Status: DC | PRN
  Administered 2021-12-17 – 2021-12-20 (×6): 50 ug via INTRAVENOUS
  Filled 2021-12-17 (×6): qty 1

## 2021-12-17 MED ORDER — CHLORHEXIDINE GLUCONATE CLOTH 2 % EX PADS
6.0000 | MEDICATED_PAD | Freq: Every day | CUTANEOUS | Status: DC
  Administered 2021-12-18 – 2021-12-29 (×12): 6 via TOPICAL

## 2021-12-17 MED ORDER — NALOXONE HCL 0.4 MG/ML IJ SOLN: 0.4000 mg | INTRAMUSCULAR | Status: DC | PRN

## 2021-12-17 MED ORDER — DOXERCALCIFEROL 4 MCG/2ML IV SOLN
4.0000 ug | INTRAVENOUS | Status: DC
  Filled 2021-12-17: qty 2

## 2021-12-17 MED ORDER — METRONIDAZOLE 500 MG/100ML IV SOLN
500.0000 mg | Freq: Once | INTRAVENOUS | Status: AC
  Administered 2021-12-17: 500 mg via INTRAVENOUS
  Filled 2021-12-17: qty 100

## 2021-12-17 MED ORDER — SODIUM CHLORIDE 0.9 % IV SOLN: 1.0000 g | INTRAVENOUS | Status: DC

## 2021-12-17 MED ORDER — HEPARIN SODIUM (PORCINE) 1000 UNIT/ML IJ SOLN
INTRAMUSCULAR | Status: AC
  Filled 2021-12-17: qty 3

## 2021-12-17 MED ORDER — VANCOMYCIN HCL 750 MG/150ML IV SOLN: 750.0000 mg | INTRAVENOUS | Status: DC

## 2021-12-17 MED ORDER — SODIUM CHLORIDE 0.9 % IV SOLN
62.5000 mg | INTRAVENOUS | Status: DC
  Filled 2021-12-17 (×2): qty 5

## 2021-12-17 MED ORDER — ACETAMINOPHEN 650 MG RE SUPP: 650.0000 mg | Freq: Four times a day (QID) | RECTAL | Status: DC | PRN

## 2021-12-17 MED ORDER — VANCOMYCIN HCL 750 MG/150ML IV SOLN
750.0000 mg | INTRAVENOUS | Status: DC
  Filled 2021-12-17 (×2): qty 150

## 2021-12-17 MED ORDER — METRONIDAZOLE 500 MG PO TABS
500.0000 mg | ORAL_TABLET | Freq: Three times a day (TID) | ORAL | Status: DC
  Filled 2021-12-17: qty 1

## 2021-12-17 MED ORDER — FENTANYL CITRATE PF 50 MCG/ML IJ SOSY
50.0000 ug | PREFILLED_SYRINGE | INTRAMUSCULAR | Status: DC | PRN
  Administered 2021-12-17: 50 ug via INTRAVENOUS
  Filled 2021-12-17: qty 1

## 2021-12-17 MED ORDER — VANCOMYCIN HCL 1500 MG/300ML IV SOLN
1500.0000 mg | Freq: Once | INTRAVENOUS | Status: AC
  Administered 2021-12-17: 1500 mg via INTRAVENOUS
  Filled 2021-12-17: qty 300

## 2021-12-17 MED ORDER — ALBUTEROL SULFATE (2.5 MG/3ML) 0.083% IN NEBU: 2.5000 mg | INHALATION_SOLUTION | RESPIRATORY_TRACT | Status: DC | PRN

## 2021-12-17 MED ORDER — SODIUM CHLORIDE 0.9 % IV SOLN
2.0000 g | Freq: Once | INTRAVENOUS | Status: AC
  Administered 2021-12-17: 2 g via INTRAVENOUS
  Filled 2021-12-17: qty 2

## 2021-12-17 MED ORDER — HEPARIN SODIUM (PORCINE) 5000 UNIT/ML IJ SOLN
5000.0000 [IU] | Freq: Three times a day (TID) | INTRAMUSCULAR | Status: DC
  Administered 2021-12-17 – 2021-12-24 (×19): 5000 [IU] via SUBCUTANEOUS
  Filled 2021-12-17 (×20): qty 1

## 2021-12-17 NOTE — ED Notes (Signed)
EDP notified of critical lactic lab

## 2021-12-17 NOTE — Plan of Care (Signed)

## 2021-12-17 NOTE — ED Notes (Signed)
Pt repositioned in bed.

## 2021-12-17 NOTE — Progress Notes (Signed)
Pharmacy Antibiotic Note  Diamond [name] is a 71 y.o. female admitted on 12/16/2021 with  back pain, rule out sepsis .  Pharmacy has been consulted for Vancomycin/Cefepime dosing. WBC is elevated. ESRD on HD TTS.   Plan: Vancomycin 1500 mg IV x 1, then 750 mg IV qHD TTS Cefepime 1g IV q24h Trend WBC, temp, HD schedule F/U infectious work-up Drug levels as indicated   Height: 5\' 7"  (170.2 cm) Weight: 68.9 kg (152 lb) IBW/kg (Calculated) : 61.6  Temp (24hrs), Avg:98.2 F (36.8 C), Min:98.1 F (36.7 C), Max:98.2 F (36.8 C)  Recent Labs  Lab 12/16/21 1450 12/17/21 0041  WBC 20.5*  --   CREATININE 9.73*  --   LATICACIDVEN  --  2.7*    Estimated Creatinine Clearance: 5.1 mL/min (A) (by C-G formula based on SCr of 9.73 mg/dL (H)).    No Known Allergies  Narda Bonds, PharmD, BCPS Clinical Pharmacist Phone: [phone]

## 2021-12-17 NOTE — Progress Notes (Signed)
PROGRESS NOTE    Diamond TISDELL  SEG:[id number] DOB: 1951/12/05 DOA: 12/16/2021 PCP: [name], MD   Chief Complaint  Patient presents with   Back Pain    Brief Narrative:   This is a no charge note as patient was seen and admitted earlier today by Dr. Rosalene Billings, chart, imaging labs were reviewed, patient was seen and examined.  Assessment & Plan:   Principal Problem:   Severe sepsis ([place]) Active Problems:   Hypertension   Anemia of chronic disease   Leukocytosis   Lactic acidosis   Low back pain   ESRD (end stage renal disease) (HCC)   Mild intermittent asthma   Sepsis, present on admission, due to bacteremia -Present on admission, leukocytosis 20 K, tachycardic 109, tachypneic 26, with elevated lactic acid of 2.7. -Blood culture showing gram-positive cocci in clusters, will start on IV vancomycin. -As well possible urinary source as evidence of cystitis on imaging. -Less likely due to gallbladder, no convincing evidence of cholecystitis on right upper quadrant ultrasound, but continue to trend LFTs closely. -Initially on IV vancomycin and cefepime, narrowed to Rocephin and Flagyl.(Now back on IV vancomycin given bacteremia)  -Patient complaining of lower back pain, so far she has declined MRI of lumbar spine, reports she is claustrophobic, I have offered to give anxiety/sedation meds prior to MRI, but she still declined.   End-stage renal disease:  - On hemodialysis on Tuesday, Thursday, Saturday schedule.  Renal consulted     Mild intermittent asthma:  -  Continue home prn albuterol inhaler.  Add on serum magnesium level.   Anemia of chronic kidney disease:  -Procrit and IV iron per renal      Hypertension:  - Hold home HCTZ for now, as above.  Continue home verapamil.  Close monitoring of ensuing blood pressure via routine vital signs.         DVT prophylaxis: Heparin Code Status: Full Family Communication: none at bedside Disposition:   Status  is: Observation  The patient will require care spanning > 2 midnights and should be moved to inpatient because: Now she is with a gram-positive cocci bacteremia, in need of IV antibiotic and further work-up.      Consultants:  renal   Subjective:  Reported generalized body ache, weakness and fatigue.  Objective: Vitals:   12/17/21 1230 12/17/21 1300 12/17/21 1330 12/17/21 1400  BP: (!) 137/58 (!) 149/97 (!) 143/64 138/78  Pulse: (!) 58 (!) 108 88 83  Resp:      Temp:      TempSrc:      SpO2:      Weight:      Height:       No intake or output data in the 24 hours ending 12/17/21 1419 Filed Weights   12/16/21 1437  Weight: 68.9 kg    Examination:  Awake Alert, Oriented X 3, frail, ill-appearing Symmetrical Chest wall movement, Good air movement bilaterally, CTAB RRR,No Gallops,Rubs or new Murmurs, No Parasternal Heave +ve B.Sounds, Abd Soft, No tenderness, No rebound - guarding or rigidity. No Cyanosis, Clubbing or edema, No new Rash or bruise       Data Reviewed: I have personally reviewed following labs and imaging studies  CBC: Recent Labs  Lab 12/16/21 1450 12/17/21 0554  WBC 20.5* 20.3*  NEUTROABS 19.0* 18.3*  HGB 12.5 11.3*  HCT 39.0 36.0  MCV 97.5 100.8*  PLT 210 737    Basic Metabolic Panel: Recent Labs  Lab 12/16/21 1450 12/17/21 0554  NA 130* 133*  K 4.5 5.4*  CL 97* 94*  CO2 15* 14*  GLUCOSE 102* 82  BUN 73* 80*  CREATININE 9.73* 9.58*  CALCIUM 9.9 8.9  MG  --  1.9  PHOS  --  7.4*    GFR: Estimated Creatinine Clearance: 5.2 mL/min (A) (by C-G formula based on SCr of 9.58 mg/dL (H)).  Liver Function Tests: Recent Labs  Lab 12/16/21 1450 12/17/21 0554  AST 15 16  ALT 10 8  ALKPHOS 71 92  BILITOT 1.1 1.3*  PROT 7.2 6.3*  ALBUMIN 3.0* 2.5*    CBG: No results for input(s): GLUCAP in the last 168 hours.   Recent Results (from the past 240 hour(s))  Resp Panel by RT-PCR (Flu A&B, Covid) Nasopharyngeal Swab     Status:  None   Collection Time: 12/16/21  6:02 PM   Specimen: Nasopharyngeal Swab; Nasopharyngeal(NP) swabs in vial transport medium  Result Value Ref Range Status   SARS Coronavirus 2 by RT PCR NEGATIVE NEGATIVE Final    Comment: (NOTE) SARS-CoV-2 target nucleic acids are NOT DETECTED.  The SARS-CoV-2 RNA is generally detectable in upper respiratory specimens during the acute phase of infection. The lowest concentration of SARS-CoV-2 viral copies this assay can detect is 138 copies/mL. A negative result does not preclude SARS-Cov-2 infection and should not be used as the sole basis for treatment or other patient management decisions. A negative result may occur with  improper specimen collection/handling, submission of specimen other than nasopharyngeal swab, presence of viral mutation(s) within the areas targeted by this assay, and inadequate number of viral copies(<138 copies/mL). A negative result must be combined with clinical observations, patient history, and epidemiological information. The expected result is Negative.  Fact Sheet for Patients:  EntrepreneurPulse.com.au  Fact Sheet for Healthcare Providers:  IncredibleEmployment.be  This test is no t yet approved or cleared by the [place] FDA and  has been authorized for detection and/or diagnosis of SARS-CoV-2 by FDA under an Emergency Use Authorization (EUA). This EUA will remain  in effect (meaning this test can be used) for the duration of the COVID-19 declaration under Section 564(b)(1) of the Act, 21 U.S.C.section 360bbb-3(b)(1), unless the authorization is terminated  or revoked sooner.       Influenza A by PCR NEGATIVE NEGATIVE Final   Influenza B by PCR NEGATIVE NEGATIVE Final    Comment: (NOTE) The Xpert Xpress SARS-CoV-2/FLU/RSV plus assay is intended as an aid in the diagnosis of influenza from Nasopharyngeal swab specimens and should not be used as a sole basis for  treatment. Nasal washings and aspirates are unacceptable for Xpert Xpress SARS-CoV-2/FLU/RSV testing.  Fact Sheet for Patients: EntrepreneurPulse.com.au  Fact Sheet for Healthcare Providers: IncredibleEmployment.be  This test is not yet approved or cleared by the [place] FDA and has been authorized for detection and/or diagnosis of SARS-CoV-2 by FDA under an Emergency Use Authorization (EUA). This EUA will remain in effect (meaning this test can be used) for the duration of the COVID-19 declaration under Section 564(b)(1) of the Act, 21 U.S.C. section 360bbb-3(b)(1), unless the authorization is terminated or revoked.  Performed at [facility], [place] [address], [place], [place] [address]   Culture, blood (routine x 2)     Status: None (Preliminary result)   Collection Time: 12/17/21  1:14 AM   Specimen: BLOOD LEFT HAND  Result Value Ref Range Status   Specimen Description BLOOD LEFT HAND  Final   Special Requests   Final  BOTTLES DRAWN AEROBIC AND ANAEROBIC Blood Culture adequate volume   Culture  Setup Time   Final    GRAM POSITIVE COCCI IN CLUSTERS IN BOTH AEROBIC AND ANAEROBIC BOTTLES CRITICAL VALUE NOTED.  VALUE IS CONSISTENT WITH PREVIOUSLY REPORTED AND CALLED VALUE. Performed at [facility], [place] [address], [place], [place] [address]    Culture PENDING  Incomplete   Report Status PENDING  Incomplete  Culture, blood (routine x 2)     Status: None (Preliminary result)   Collection Time: 12/17/21  1:19 AM   Specimen: BLOOD  Result Value Ref Range Status   Specimen Description BLOOD SITE NOT SPECIFIED  Final   Special Requests   Final    BOTTLES DRAWN AEROBIC AND ANAEROBIC Blood Culture results may not be optimal due to an inadequate volume of blood received in culture bottles   Culture  Setup Time   Final    GRAM POSITIVE COCCI IN CLUSTERS IN BOTH AEROBIC AND ANAEROBIC BOTTLES Organism ID to follow Performed at  [facility], [place] [address], [place], [place] [address]    Culture PENDING  Incomplete   Report Status PENDING  Incomplete         Radiology Studies: DG Chest 2 View  Result Date: 12/17/2021 CLINICAL DATA:  Short S of breath, back pain EXAM: CHEST - 2 VIEW COMPARISON:  11/05/2019 FINDINGS: Right dialysis catheter tip in the right atrium. Mild cardiomegaly. No confluent opacities, effusions or edema. No acute bony abnormality. IMPRESSION: Mild cardiomegaly.  No active disease. Electronically Signed   By: Rolm Baptise M.D.   On: 12/17/2021 01:54   DG Lumbar Spine Complete  Result Date: 12/15/2021 CLINICAL DATA:  Provided history: Back pain. EXAM: LUMBAR SPINE - COMPLETE 4+ VIEW COMPARISON:  CT abdomen/pelvis 10/01/2021. radiographs of the lumbar spine 10/01/2021. FINDINGS: Transitional lumbosacral anatomy better appreciated on the prior CT abdomen/pelvis of 10/01/2021. For the purposes of this dictation, the L5 vertebra is sacralized and there is a rudimentary L5-S1 interspace. Mild L3-L4 grade 1 anterolisthesis. Redemonstrated mild chronic L2 superior endplate vertebral compression deformity. Vertebral body height is otherwise maintained. Multilevel disc space narrowing. Most notably, there is moderate/advanced disc space narrowing at L4-L5 with associated degenerative endplate osteophytosis at this level. Multilevel facet arthrosis, greatest within the lower lumbar spine. IMPRESSION: Transitional lumbosacral anatomy, as described. Unchanged mild chronic L2 superior endplate vertebral compression deformity. Lumbar spondylosis, as described and greatest at L4-L5. Mild L3-L4 grade 1 anterolisthesis. Electronically Signed   By: Kellie Simmering D.O.   On: 12/15/2021 16:42   CT Renal Stone Study  Result Date: 12/17/2021 CLINICAL DATA:  Dialysis patient with flank pain. Kidney stone suspected. EXAM: CT ABDOMEN AND PELVIS WITHOUT CONTRAST TECHNIQUE: Multidetector CT imaging of the abdomen and pelvis  was performed following the standard protocol without IV contrast. RADIATION DOSE REDUCTION: This exam was performed according to the departmental dose-optimization program which includes automated exposure control, adjustment of the mA and/or kV according to patient size and/or use of iterative reconstruction technique. COMPARISON:  CT without contrast 10/01/2021. FINDINGS: Lower chest: Above plane of the prior study, there is an ovoid well-circumscribed low-density right middle lobe 10.6 x 7.0 mm nodule laterally on axial image 3 of series 4, with pleural stranding. This was not present on prior chest CT of 10/15/2010 therefore is indeterminate in significance. There are few linear scar-like opacities in the lung bases but no active infiltrates. Cardiac size is normal. There are coronary artery calcifications. No pericardial fluid.  Hepatobiliary: As previously there are air containing cholesterol stones in the gallbladder up to 2.5 cm in diameter. Equivocal mild thickening of the fundal wall but no pericholecystic edema or fluid is seen and no biliary dilatation. No focal abnormality of unenhanced liver. Pancreas: Unremarkable without contrast.  No adjacent edema. Spleen: Unremarkable without contrast apart from calcified granulomas. Adrenals/Urinary Tract: There is no adrenal mass. There are small renal cysts. Benign cortical calcification of the lower lateral right kidney is again noted. No intrarenal stone is seen and no ureteral stones or hydronephrosis. Mild bladder thickening versus underdistention. Stomach/Bowel: No dilatation or wall thickening including the appendix. There is fluid in the colon in the ascending and transverse segments. Vascular/Lymphatic: Aortic atherosclerosis. No enlarged abdominal or pelvic lymph nodes. Reproductive: The uterus is intact. There are vascular calcifications in the outer wall. The ovaries are not enlarged. Other: There is no free air, hemorrhage or fluid. Small umbilical  fat hernia. Musculoskeletal: Transitional anatomy at L5 with sacralization. Osteopenia and degenerative changes are noted with reactive endplate sclerosis at W4-0, chronic disc collapse. At L2, there previously appeared to be a nondisplaced longitudinal fracture through the anterior column of the vertebral body, which appears to have largely healed in the interval but with prominent endplate Schmorl's node in the superior vertebral body now seen. There is only a slight loss of the anterior vertebral height at this level. Reactive sclerosis in the L2 vertebral body is consistent with a healing response. IMPRESSION: 1. No urinary stones or obstruction on the current and prior studies. 2. Cholelithiasis with equivocal thickening of the fundal wall but no pericholecystic edema or fluid. This was also seen previously. If there are localizing symptoms, ultrasound may be helpful. 3. Cystitis versus bladder nondistention. 4. Fluid in the ascending and transverse colon which could be due to a diarrheal enteritis. No wall thickening or inflammatory change. 5. Previous study with suspected longitudinal nondisplaced fracture of the anterior column of L2. The fracture line is no longer visible. There is a prominent upper plate Schmorl's node now seen and only slight loss of anterior vertebral body height, without retropulsion. Marrow sclerosis in the vertebral body is consistent with healing response. 6. Aortic atherosclerosis. 7. 10.6 x 7.0 mm well-circumscribed oval right middle lobe nodule above the plane of the recent prior abdomen and pelvis CT and not seen on a 2011 chest CT. Three-month follow-up CT or PET-CT recommended. Electronically Signed   By: Telford Nab M.D.   On: 12/17/2021 00:32   US Abdomen Limited RUQ (LIVER/GB)  Result Date: 12/17/2021 CLINICAL DATA:  Back pain EXAM: ULTRASOUND ABDOMEN LIMITED RIGHT UPPER QUADRANT COMPARISON:  CT earlier today FINDINGS: Gallbladder: Multiple stones fill the  gallbladder, measuring up to 2.3 cm. Gallbladder wall is borderline in thickness at 3 mm. Negative sonographic Murphy's. Common bile duct: Diameter: Mildly dilated at 9 mm, tapering distally. Liver: No focal lesion identified. Within normal limits in parenchymal echogenicity. Portal vein is patent on color Doppler imaging with normal direction of blood flow towards the liver. Other: None. IMPRESSION: Cholelithiasis. No convincing sonographic evidence of acute cholecystitis. Common bile duct is mildly dilated proximally, then tapers distally. Recommend correlation with LFTs. Electronically Signed   By: Rolm Baptise M.D.   On: 12/17/2021 01:35        Scheduled Meds:  Chlorhexidine Gluconate Cloth  6 each Topical Q0600   heparin sodium (porcine)       metroNIDAZOLE  500 mg Oral Q8H   verapamil  120 mg Oral  Q8H   Continuous Infusions:  cefTRIAXone (ROCEPHIN)  IV       LOS: 0 days       Phillips Climes, MD [facility]   To contact the attending provider between 7A-7P or the covering provider during after hours 7P-7A, please log into the web site [email or web address] and access using universal [place] password for that web site. If you do not have the password, please call the hospital operator.  12/17/2021, 2:19 PM   Patient ID: Diamond [name], female   DOB: 1951-03-26, 71 y.o.   MRN: [id number]

## 2021-12-17 NOTE — ED Notes (Signed)
Pt decline needing to urinate, Rn educated pt that we need a urine sample so when she feels she need to urinate to let the RN know

## 2021-12-17 NOTE — H&P (Signed)
History and Physical    PLEASE NOTE THAT DRAGON DICTATION SOFTWARE WAS USED IN THE CONSTRUCTION OF THIS NOTE.   Diamond [name] GDJ:[id number] DOB: 03-Mar-1951 DOA: 12/16/2021  PCP: [name], MD  Patient coming from: home   I have personally briefly reviewed patient's old medical records in [place]  Chief Complaint: Right-sided back pain  HPI: Diamond [name] is a 71 y.o. female with medical history significant for incisional disease on hemodialysis on Tuesday, Thursday, Saturday schedule, mild intermittent asthma, hypertension, anemia of chronic kidney disease, who is admitted to [facility] on 12/16/2021 with severe sepsis after presenting from home to [facility] ED complaining of right-sided back pain.   The patient reports to 3 days of progressive right-sided back pain, noting a sharp constant back pain located lateral to the midline on the right side, radiating into the right flank.  Denies any reported exacerbating factors.  While she has a history of end-stage renal disease on hemodialysis on Tuesday, Thursday, Saturday schedule, with most recent hemodialysis occurring on Tuesday, 12/15/2021, she conveys that she still does produce urine, noting that she typically urinates 2-3 times per day.  Within the setting, she denies any recent acute dysuria, gross hematuria, or change in urinary urgency/frequency relative to these baseline urinary habits.  Denies any overt abdominal pain, diarrhea, melena, or hematochezia.  No recent rash, trauma, or travel.  She also denies any recent chest pain, shortness of breath, palpitations, diaphoresis, presyncope, or syncope.  No recent cough, wheezing.  Denies any associated subjective fever, chills, rigors, or generalized myalgias.  Not associate with any headache or neck stiffness.  No recent rhinitis, rhinorrhea, sore throat.  Denies any use of recreational drugs.  Of note, per chart review, it appears that she presented to the emergency  department in October 2022 with similar appearing back pain, with benign work-up at that time.     ED Course:  Vital signs in the ED were notable for the following: Afebrile; initial heart rate 91, which decreased to 79 following interval IV fluids, as further detailed below; blood pressure 115/86  -142/86; respiratory rate 16-19, oxygen saturation 98-1 high percent on room air.  Labs were notable for the following: CMP notable for the following: Potassium 4.5, glucose 102, alkaline phosphatase 71, AST 15, ALT 10, total bilirubin 1.1.  CBC notable for the following: States white blood cell count 20.5, with 93% neutrophils, hemoglobin 12.5 compared most recent prior value of 11.3 on 10/01/2021.  Initial lactate 2.7, with repeat lactate result currently pending.  Urinalysis has been ordered, with results currently pending.  COVID-19 PCR/influenza PCR were found to be negative.  Blood cultures x2 and urine culture ordered, with results currently pending.  Imaging and additional notable ED work-up: Chest x-ray showed no evidence of acute cardiopulmonary process.  CT renal stone study showed no evidence of ureteral stone, nor any evidence of hydroureteronephrosis; CT renal stone study did show cholelithiasis with equivocal gallbladder wall thickening involving the fundal wall, but in the absence of any evidence of pericholecystic fluid no evidence of choledocholithiasis, with these findings unchanged from CT renal stone study performed in October 2022; also demonstrated bladder inflammation, potentially suggestive of cystitis.  Ensuing ultrasound of the right upper quadrant showed cholelithiasis without evidence of acute cholecystitis.  While in the ED, the following were administered: Acetaminophen 500 mg p.o. x1, normal saline x500 cc bolus, fentanyl 50 mcg IV x1, cefepime, Flagyl, IV vancomycin.  Socially, the patient was admitted for overnight  observation to the PCU for further evaluation management of  severe sepsis, including further work-up for underlying source of potential infection as well as interval administration of IV antibiotics.     Review of Systems: As per HPI otherwise 10 point review of systems negative.   Past Medical History:  Diagnosis Date   Asthma    Chronic kidney disease    dialysis Tues Thurs Sat   Dysrhythmia    PAF 10/2019 in setting of COVID-19 PNA   Gout    Hypertension     Past Surgical History:  Procedure Laterality Date   CESAREAN SECTION  1989    Social History:  reports that she has never smoked. She has never used smokeless tobacco. She reports that she does not drink alcohol and does not use drugs.   No Known Allergies  Family History  Problem Relation Age of Onset   Hypertension Mother    Hypertension Father    Colon cancer Brother    Lung cancer Brother     Family history reviewed and not pertinent    Prior to Admission medications   Medication Sig Start Date End Date Taking? Authorizing Provider  albuterol (VENTOLIN HFA) 108 (90 Base) MCG/ACT inhaler Inhale 2 puffs into the lungs every 4 (four) hours as needed for wheezing or shortness of breath. 10/08/19   [provider]  colchicine 0.6 MG tablet Take 0.6 mg by mouth daily as needed (gout).    [provider]  diclofenac Sodium (VOLTAREN) 1 % GEL Apply 2 g topically 4 (four) times daily. Patient not taking: Reported on 12/02/2021 10/01/21   Long, Wonda Olds, MD  traMADol (ULTRAM) 50 MG tablet Take 1 tablet (50 mg total) by mouth every 6 (six) hours as needed. Patient not taking: Reported on 10/02/2021 09/10/21   Drenda Freeze, MD  verapamil (CALAN) 120 MG tablet Take 120 mg by mouth 3 (three) times daily.    [provider]     Objective    Physical Exam: Vitals:   12/16/21 2347 12/17/21 0057 12/17/21 0100 12/17/21 0215  BP:  (!) 142/86 138/86 (!) 153/97  Pulse:  76 75 79  Resp: _0 Temp:      TempSrc:      SpO2:  100% 98% 100%   Weight:      Height:        General: appears to be stated age; alert, oriented Skin: warm, dry, no rash Head:  AT/[place] Mouth:  Oral mucosa membranes appear moist, normal dentition Neck: supple; trachea midline Heart:  RRR; did not appreciate any M/R/G Lungs: CTAB, did not appreciate any wheezes, rales, or rhonchi Abdomen: + BS; soft, ND, NT Vascular: 2+ pedal pulses b/l; 2+ radial pulses b/l Extremities: no peripheral edema, no muscle wasting Neuro: strength and sensation intact in upper and lower extremities b/l   Labs on Admission: I have personally reviewed following labs and imaging studies  CBC: Recent Labs  Lab 12/16/21 1450  WBC 20.5*  NEUTROABS 19.0*  HGB 12.5  HCT 39.0  MCV 97.5  PLT 440   Basic Metabolic Panel: Recent Labs  Lab 12/16/21 1450  NA 130*  K 4.5  CL 97*  CO2 15*  GLUCOSE 102*  BUN 73*  CREATININE 9.73*  CALCIUM 9.9   GFR: Estimated Creatinine Clearance: 5.1 mL/min (A) (by C-G formula based on SCr of 9.73 mg/dL (H)). Liver Function Tests: Recent Labs  Lab 12/16/21 1450  AST 15  ALT 10  ALKPHOS 71  BILITOT 1.1  PROT 7.2  ALBUMIN 3.0*   No results for input(s): LIPASE, AMYLASE in the last 168 hours. No results for input(s): AMMONIA in the last 168 hours. Coagulation Profile: No results for input(s): INR, PROTIME in the last 168 hours. Cardiac Enzymes: No results for input(s): CKTOTAL, CKMB, CKMBINDEX, TROPONINI in the last 168 hours. BNP (last 3 results) No results for input(s): PROBNP in the last 8760 hours. HbA1C: No results for input(s): HGBA1C in the last 72 hours. CBG: No results for input(s): GLUCAP in the last 168 hours. Lipid Profile: No results for input(s): CHOL, HDL, LDLCALC, TRIG, CHOLHDL, LDLDIRECT in the last 72 hours. Thyroid Function Tests: No results for input(s): TSH, T4TOTAL, FREET4, T3FREE, THYROIDAB in the last 72 hours. Anemia Panel: No results for input(s): VITAMINB12, FOLATE, FERRITIN, TIBC, IRON,  RETICCTPCT in the last 72 hours. Urine analysis:    Component Value Date/Time   COLORURINE STRAW (A) 10/01/2021 1234   APPEARANCEUR CLEAR 10/01/2021 1234   LABSPEC 1.009 10/01/2021 1234   PHURINE 7.0 10/01/2021 1234   GLUCOSEU 50 (A) 10/01/2021 1234   HGBUR NEGATIVE 10/01/2021 [place] 10/01/2021 1234   KETONESUR NEGATIVE 10/01/2021 1234   PROTEINUR 100 (A) 10/01/2021 1234   UROBILINOGEN 0.2 09/27/2021 1329   NITRITE NEGATIVE 10/01/2021 1234   LEUKOCYTESUR TRACE (A) 10/01/2021 1234    Radiological Exams on Admission: DG Chest 2 View  Result Date: 12/17/2021 CLINICAL DATA:  Short S of breath, back pain EXAM: CHEST - 2 VIEW COMPARISON:  11/05/2019 FINDINGS: Right dialysis catheter tip in the right atrium. Mild cardiomegaly. No confluent opacities, effusions or edema. No acute bony abnormality. IMPRESSION: Mild cardiomegaly.  No active disease. Electronically Signed   By: Rolm Baptise M.D.   On: 12/17/2021 01:54   DG Lumbar Spine Complete  Result Date: 12/15/2021 CLINICAL DATA:  Provided history: Back pain. EXAM: LUMBAR SPINE - COMPLETE 4+ VIEW COMPARISON:  CT abdomen/pelvis 10/01/2021. radiographs of the lumbar spine 10/01/2021. FINDINGS: Transitional lumbosacral anatomy better appreciated on the prior CT abdomen/pelvis of 10/01/2021. For the purposes of this dictation, the L5 vertebra is sacralized and there is a rudimentary L5-S1 interspace. Mild L3-L4 grade 1 anterolisthesis. Redemonstrated mild chronic L2 superior endplate vertebral compression deformity. Vertebral body height is otherwise maintained. Multilevel disc space narrowing. Most notably, there is moderate/advanced disc space narrowing at L4-L5 with associated degenerative endplate osteophytosis at this level. Multilevel facet arthrosis, greatest within the lower lumbar spine. IMPRESSION: Transitional lumbosacral anatomy, as described. Unchanged mild chronic L2 superior endplate vertebral compression deformity.  Lumbar spondylosis, as described and greatest at L4-L5. Mild L3-L4 grade 1 anterolisthesis. Electronically Signed   By: Kellie Simmering D.O.   On: 12/15/2021 16:42   CT Renal Stone Study  Result Date: 12/17/2021 CLINICAL DATA:  Dialysis patient with flank pain. Kidney stone suspected. EXAM: CT ABDOMEN AND PELVIS WITHOUT CONTRAST TECHNIQUE: Multidetector CT imaging of the abdomen and pelvis was performed following the standard protocol without IV contrast. RADIATION DOSE REDUCTION: This exam was performed according to the departmental dose-optimization program which includes automated exposure control, adjustment of the mA and/or kV according to patient size and/or use of iterative reconstruction technique. COMPARISON:  CT without contrast 10/01/2021. FINDINGS: Lower chest: Above plane of the prior study, there is an ovoid well-circumscribed low-density right middle lobe 10.6 x 7.0 mm nodule laterally on axial image 3 of series 4, with pleural stranding. This was not present on prior chest CT of 10/15/2010 therefore is  indeterminate in significance. There are few linear scar-like opacities in the lung bases but no active infiltrates. Cardiac size is normal. There are coronary artery calcifications. No pericardial fluid. Hepatobiliary: As previously there are air containing cholesterol stones in the gallbladder up to 2.5 cm in diameter. Equivocal mild thickening of the fundal wall but no pericholecystic edema or fluid is seen and no biliary dilatation. No focal abnormality of unenhanced liver. Pancreas: Unremarkable without contrast.  No adjacent edema. Spleen: Unremarkable without contrast apart from calcified granulomas. Adrenals/Urinary Tract: There is no adrenal mass. There are small renal cysts. Benign cortical calcification of the lower lateral right kidney is again noted. No intrarenal stone is seen and no ureteral stones or hydronephrosis. Mild bladder thickening versus underdistention. Stomach/Bowel: No  dilatation or wall thickening including the appendix. There is fluid in the colon in the ascending and transverse segments. Vascular/Lymphatic: Aortic atherosclerosis. No enlarged abdominal or pelvic lymph nodes. Reproductive: The uterus is intact. There are vascular calcifications in the outer wall. The ovaries are not enlarged. Other: There is no free air, hemorrhage or fluid. Small umbilical fat hernia. Musculoskeletal: Transitional anatomy at L5 with sacralization. Osteopenia and degenerative changes are noted with reactive endplate sclerosis at N8-2, chronic disc collapse. At L2, there previously appeared to be a nondisplaced longitudinal fracture through the anterior column of the vertebral body, which appears to have largely healed in the interval but with prominent endplate Schmorl's node in the superior vertebral body now seen. There is only a slight loss of the anterior vertebral height at this level. Reactive sclerosis in the L2 vertebral body is consistent with a healing response. IMPRESSION: 1. No urinary stones or obstruction on the current and prior studies. 2. Cholelithiasis with equivocal thickening of the fundal wall but no pericholecystic edema or fluid. This was also seen previously. If there are localizing symptoms, ultrasound may be helpful. 3. Cystitis versus bladder nondistention. 4. Fluid in the ascending and transverse colon which could be due to a diarrheal enteritis. No wall thickening or inflammatory change. 5. Previous study with suspected longitudinal nondisplaced fracture of the anterior column of L2. The fracture line is no longer visible. There is a prominent upper plate Schmorl's node now seen and only slight loss of anterior vertebral body height, without retropulsion. Marrow sclerosis in the vertebral body is consistent with healing response. 6. Aortic atherosclerosis. 7. 10.6 x 7.0 mm well-circumscribed oval right middle lobe nodule above the plane of the recent prior abdomen  and pelvis CT and not seen on a 2011 chest CT. Three-month follow-up CT or PET-CT recommended. Electronically Signed   By: Telford Nab M.D.   On: 12/17/2021 00:32   US Abdomen Limited RUQ (LIVER/GB)  Result Date: 12/17/2021 CLINICAL DATA:  Back pain EXAM: ULTRASOUND ABDOMEN LIMITED RIGHT UPPER QUADRANT COMPARISON:  CT earlier today FINDINGS: Gallbladder: Multiple stones fill the gallbladder, measuring up to 2.3 cm. Gallbladder wall is borderline in thickness at 3 mm. Negative sonographic Murphy's. Common bile duct: Diameter: Mildly dilated at 9 mm, tapering distally. Liver: No focal lesion identified. Within normal limits in parenchymal echogenicity. Portal vein is patent on color Doppler imaging with normal direction of blood flow towards the liver. Other: None. IMPRESSION: Cholelithiasis. No convincing sonographic evidence of acute cholecystitis. Common bile duct is mildly dilated proximally, then tapers distally. Recommend correlation with LFTs. Electronically Signed   By: Rolm Baptise M.D.   On: 12/17/2021 01:35       Assessment/Plan   Principal Problem:  Severe sepsis (HCC) Active Problems:   Hypertension   Anemia of chronic disease   Leukocytosis   Lactic acidosis   Low back pain   ESRD (end stage renal disease) ([place])   Mild intermittent asthma     #) Severe sepsis: In the setting of presenting leukocytosis of 20,500 with 93% neutrophils as well as tachycardia, with concern for potential underlying infection, although clear source not identified at this time.  Terms of potential underlying infectious source, differential includes urinary tract infection given that patient reports that she continues to produce urine in spite of history of end-stage renal disease, with CT renal stone study showing evidence suggestive of cystitis, as further detailed above.  Differential also includes the possibility of evolving acute cholecystitis given findings of gallbladder wall thickening without  overt evidence of acute cholecystitis imaging performed this evening, as further detailed above.  Of note, no evidence of cholestatic process or transaminitis on initial liver enzyme findings, although we will continue to closely monitor including trend thereof.  Presentation appears less suggestive of discitis osteomyelitis involving the lumbar spine, but will check CRP/ESR to further assess, with potential consideration for MRI lumbar spine depending upon ensuing clinical course as well as results of these laboratory studies.  In the meantime, we will continue IV antibiotics, but with various modifications, including discontinuation of IV vancomycin, de-escalation of cefepime by continuation of IV Flagyl given that potential infectious source includes that of acute cholecystitis at this time.  We will de-escalate cefepime to Rocephin to complete coverage for acute cholecystitis versus UTI, pending further work-up as outlined below.  Of note, given the associated presence of suspected end organ damage in the form of concominant presenting elevated lactate of 2.7, criteria are met for pt's sepsis to be considered severe in nature. However, in the absence of lactic acid level that is greater than or equal to 4.0, and in the absence of any associated hypotension refractory to IVF's, there are no indications for administration of a 30 mL/kg IVF bolus at this time.  Blood cultures x2 collected prior to initiation of IV vancomycin, cefepime, and Flagyl in the ED this evening.   Plan: CBC w/ diff in AM.  Follow for results of blood cx's x 2. Abx: Discontinue IV vancomycin and cefepime.  Continue IV Flagyl and start Rocephin, as further detailed above.  Follow-up for repeat lactic acid level.  Lactated Ringer's at 50 cc/h x 4 hours.  CMP in the morning.  Check CBC RP and ESR.  Add on procalcitonin.  Follow-up result urinalysis.  Prn acetaminophen for right-sided back pain.  Check urinary drug screen.      #)  End-stage renal disease: On hemodialysis on Tuesday, Thursday, Saturday schedule.  Most recent full hemodialysis session completed on Tuesday, 12/15/2021.  No evidence of hyperkalemia or acute volume overload, including present chest x-ray shows no evidence of acute cardiopulmonary process, including no evidence of edema or pleural effusion.  Overall, no clinical, radiographic, or laboratory evidence to suggest urgent need for overnight hemodialysis, but will likely need nephrology consultation at some point on 12/17/2021 for scheduling of routine hemodialysis.   Plan: Monitor strict I's and O's and daily weights.  Follow-up result of urinalysis with microscopy, as above.  Repeat BMP in the morning.  Check serum magnesium and phosphorus levels.       #) Mild intermittent asthma: Documented history of such, without evidence of acute exacerbation at this time.  Home respiratory regimen consists of prn albuterol inhaler.  Plan: Continue home prn albuterol inhaler.  Add on serum magnesium level.        #) Anemia of chronic kidney disease: Documented history of such, in the setting of known end-stage renal disease on hemodialysis, with baseline hemoglobin noted to be approximately 12, with presenting hemoglobin this evening consistent with this value.  No evidence of active bleed at this time.  Plan: Repeat CBC in the morning.      #) Hypertension: Potentially component of secondary hypertension in the context of end-stage renal disease, on verapamil as well as HCTZ as an outpatient.  We will hold home HCTZ for now in the setting of presenting borderline hypercalcemia.  Plan: Hold home HCTZ for now, as above.  Continue home verapamil.  Close monitoring of ensuing blood pressure via routine vital signs.     DVT prophylaxis: SCD's   Code Status: Full code Family Communication: none Disposition Plan: Per Rounding Team Consults called: none;  Admission status: Observation;  PCU    PLEASE NOTE THAT DRAGON DICTATION SOFTWARE WAS USED IN THE CONSTRUCTION OF THIS NOTE.   [place] DO [facility]  From [place]   12/17/2021, 2:59 AM

## 2021-12-17 NOTE — Progress Notes (Signed)
RN and NT at bedside. Patient arrived to 5W unit from Dialysis unit. Patient appears calm but complains of pain. Tylenol 650mg  given. Patient assessed. Skin breakdown observed; pressure pad applied. IV site noted to appear bloody; patient complains of pain during saline flush. IV site removed + IV team consulted. Patient oriented to unit, call light within reach. Will continue to monitor

## 2021-12-17 NOTE — ED Notes (Signed)
RN and NT repositioned pt in bed. Pt stated she was in 10/10 pain. Pt was given PRN pain medication.

## 2021-12-17 NOTE — Progress Notes (Signed)
Pharmacy Antibiotic Note  Diamond [name] is a 71 y.o. female admitted on 12/16/2021 with 3 day hx of R-sided back pain. Pt with elevated WBC and tachycardia.  Pt received initial dose of abx in the ED.  Now with + blood cultures.  Pharmacy has been consulted for Vancomycin dosing.  Noted 2 BCx with [facility] in clusters.  BCID pending.  Pt with ESRD on HD TTS schedule.  Currently in hemodialysis now.   Pt received Vancomycin loading dose of 1500mg  x 1 in the ED 1/12 AM  Plan: Vancomycin 750mg  IV qHD - first dose 1/12 Follow-up cx data and clinical progress. Goal pre-HD level 15-25 mcg/ml.  Height: 5\' 7"  (170.2 cm) Weight:  (stretcher w no sclae pt cant stand) IBW/kg (Calculated) : 61.6  Temp (24hrs), Avg:97.9 F (36.6 C), Min:97.5 F (36.4 C), Max:98.2 F (36.8 C)  Recent Labs  Lab 12/16/21 1450 12/17/21 0041 12/17/21 0237 12/17/21 0554 12/17/21 0755  WBC 20.5*  --   --  20.3*  --   CREATININE 9.73*  --   --  9.58*  --   LATICACIDVEN  --  2.7* 2.1*  --  1.8    Estimated Creatinine Clearance: 5.2 mL/min (A) (by C-G formula based on SCr of 9.58 mg/dL (H)).    No Known Allergies  Antimicrobials this admission: Ceftriaxone 1/12>> Metronid 1/12>> Vanc 1/12 >> Cefepime x1  Dose adjustments this admission:   Microbiology results: 1/12 BCx x 2: 2/2 GPC clusters, BCID pending 1/11 UCx: sent 1/11 COVID / Flu neg  Thank you for allowing pharmacy to be a part of this patients care.  [employer], Pharm.D., BCPS Clinical Pharmacist  **Pharmacist phone directory can be found on [email or web address] listed under [place].  12/17/2021 2:56 PM

## 2021-12-17 NOTE — Procedures (Signed)
° °  I was present at this dialysis session, have reviewed the session itself and made  appropriate changes Kelly Splinter MD [place] pager [phone]   12/17/2021, 2:42 PM

## 2021-12-17 NOTE — ED Notes (Signed)
Pt son at bedside wanted to follow up on pt care. Pt has not been to dialysis since 12/10/21 and she wants to make sure she gets dialysis. Notified admitting MD

## 2021-12-17 NOTE — Consult Note (Signed)
Renal Service Consult Note [name]  Diamond [name] 12/17/2021 [name], MD Requesting Physician: Dr. Waldron Labs  Reason for Consult: ESRD pt w/ back pain HPI: The patient is a 71 y.o. year-old w/ hx of HTN, asthma, esrd on HD TTS, anemia who presented to ED w/ R sided back pain on 1/12 early this am.  In ED pt was afebrile, HR 90, ave bp 135/80, RR 16. WBC 20K, Hb 12, UA pending. CV neg. Blood/ urine cx's sent and IV empiric abx started. CXR showed no acute. CT abd showed no stones, no obstruction, +gallstones +/- wall thickening, also bladder inflammation poss cystitis. Asked to see for ESRD.    Pt seen in HD.  Last HD was on 1/10. Pt started HD in aug 2022, using R TDC, no AVF yet. Lives alone. +HTN, no DM or CAD.     ROS - denies CP, no joint pain, no HA, no blurry vision, no rash, no diarrhea, no nausea/ vomiting, no dysuria, no difficulty voiding   Past Medical History  Past Medical History:  Diagnosis Date   Asthma    Chronic kidney disease    dialysis Tues Thurs Sat   Dysrhythmia    PAF 10/2019 in setting of COVID-19 PNA   Gout    Hypertension    Past Surgical History  Past Surgical History:  Procedure Laterality Date   CESAREAN SECTION  1989   Family History  Family History  Problem Relation Age of Onset   Hypertension Mother    Hypertension Father    Colon cancer Brother    Lung cancer Brother    Social History  reports that she has never smoked. She has never used smokeless tobacco. She reports that she does not drink alcohol and does not use drugs. Allergies No Known Allergies Home medications Prior to Admission medications   Medication Sig Start Date End Date Taking? Authorizing Provider  albuterol (VENTOLIN HFA) 108 (90 Base) MCG/ACT inhaler Inhale 2 puffs into the lungs every 4 (four) hours as needed for wheezing or shortness of breath. 10/08/19  Yes [provider]  allopurinol (ZYLOPRIM) 100 MG tablet Take 200 mg by  mouth daily. 12/03/21  Yes [provider]  colchicine 0.6 MG tablet Take 0.6 mg by mouth daily as needed (gout).   Yes [provider]  ondansetron (ZOFRAN) 4 MG tablet Take 4 mg by mouth every 8 (eight) hours as needed. 09/17/21  Yes [provider]  verapamil (CALAN) 120 MG tablet Take 120 mg by mouth 3 (three) times daily.   Yes [provider]  diclofenac Sodium (VOLTAREN) 1 % GEL Apply 2 g topically 4 (four) times daily. Patient not taking: Reported on 12/02/2021 10/01/21   Margette Fast, MD  predniSONE (DELTASONE) 10 MG tablet Take 10 mg by mouth daily. Patient not taking: Reported on 12/17/2021 08/26/21   [provider]  traMADol (ULTRAM) 50 MG tablet Take 1 tablet (50 mg total) by mouth every 6 (six) hours as needed. Patient not taking: Reported on 10/02/2021 09/10/21   Drenda Freeze, MD     Vitals:   12/17/21 0100 12/17/21 0215 12/17/21 0545 12/17/21 0700  BP: 138/86 (!) 153/97 (!) 149/86 (!) 149/89  Pulse: 75 79 89 95  Resp:  19 (!) 26 (!) 23  Temp:      TempSrc:      SpO2: 98% 100% 100% 100%  Weight:      Height:  Exam Gen alert, no distress No rash, cyanosis or gangrene Sclera anicteric, throat clear  No jvd or bruits Chest clear bilat to bases, no rales/ wheezing RRR no MRG Abd soft ntnd no mass or ascites +bs MS no joint effusions or deformity Ext no LE or UE edema, no wounds or ulcers Neuro is alert, Ox 3 , nf  R IJ TDC   Home meds include - zyloprim, colchicine, verapamil 120 tid, ultram prn, albuterol prn hfa, prns/ vits/ supps   Na 133  K 5.4  BUN 80  Creat 9.6  Alb 2.5  phos 7.4 Ca 8.9     OP HD: TTS HD  4h  400/600  68.5kg  3K/2 bath  Hep 2000  access ?   - hect 4 ug tiw  - venofer 50 q wk  - no esa, last Hb 11.9  Assessment/ Plan: Sepsis - w/ ^^wBC, not clear source. CT suggesting bladder infection, also +gallstones w/o definite cholecystitis. Started on empiric IV abx in ED, cx's sent. Per  pmd.   ESRD - on HD TTS.  HD today.  HTN/vol - cont verapamil. No gross vol overload. UF 2 L goal w/ HD.  Anemia ckd - Hb 11, no esa needs MBD ckd - Ca in range, phos up, cont binders and vdra       Kelly Splinter  MD 12/17/2021, 9:44 AM  Recent Labs  Lab 12/16/21 1450 12/17/21 0554  WBC 20.5* 20.3*  HGB 12.5 11.3*   Recent Labs  Lab 12/16/21 1450 12/17/21 0554  K 4.5 5.4*  BUN 73* 80*  CREATININE 9.73* 9.58*  CALCIUM 9.9 8.9  PHOS  --  7.4*

## 2021-12-17 NOTE — Progress Notes (Signed)
PHARMACY - PHYSICIAN COMMUNICATION CRITICAL VALUE ALERT - BLOOD CULTURE IDENTIFICATION (BCID)  Diamond [name] is an 71 y.o. female who presented to [facility] on 12/16/2021 with a chief complaint of sepsis.  Assessment:  Pt with MRSA bacteremia  Name of physician (or Provider) Contacted: Dr. Waldron Labs  Current antibiotics: Vancomycin, Rocephin, Flagyl  Changes to prescribed antibiotics recommended:  D/c Rocephin and Flagyl. ID will also get automatic consult.  Results for orders placed or performed during the hospital encounter of 12/16/21  Blood Culture ID Panel (Reflexed) (Collected: 12/17/2021  1:19 AM)  Result Value Ref Range   Enterococcus faecalis NOT DETECTED NOT DETECTED   Enterococcus Faecium NOT DETECTED NOT DETECTED   Listeria monocytogenes NOT DETECTED NOT DETECTED   Staphylococcus species DETECTED (A) NOT DETECTED   Staphylococcus aureus (BCID) DETECTED (A) NOT DETECTED   Staphylococcus epidermidis NOT DETECTED NOT DETECTED   Staphylococcus lugdunensis NOT DETECTED NOT DETECTED   Streptococcus species NOT DETECTED NOT DETECTED   Streptococcus agalactiae NOT DETECTED NOT DETECTED   Streptococcus pneumoniae NOT DETECTED NOT DETECTED   Streptococcus pyogenes NOT DETECTED NOT DETECTED   A.calcoaceticus-baumannii NOT DETECTED NOT DETECTED   Bacteroides fragilis NOT DETECTED NOT DETECTED   Enterobacterales NOT DETECTED NOT DETECTED   Enterobacter cloacae complex NOT DETECTED NOT DETECTED   Escherichia coli NOT DETECTED NOT DETECTED   Klebsiella aerogenes NOT DETECTED NOT DETECTED   Klebsiella oxytoca NOT DETECTED NOT DETECTED   Klebsiella pneumoniae NOT DETECTED NOT DETECTED   Proteus species NOT DETECTED NOT DETECTED   Salmonella species NOT DETECTED NOT DETECTED   Serratia marcescens NOT DETECTED NOT DETECTED   Haemophilus influenzae NOT DETECTED NOT DETECTED   Neisseria meningitidis NOT DETECTED NOT DETECTED   Pseudomonas aeruginosa NOT DETECTED NOT DETECTED    Stenotrophomonas maltophilia NOT DETECTED NOT DETECTED   Candida albicans NOT DETECTED NOT DETECTED   Candida auris NOT DETECTED NOT DETECTED   Candida glabrata NOT DETECTED NOT DETECTED   Candida krusei NOT DETECTED NOT DETECTED   Candida parapsilosis NOT DETECTED NOT DETECTED   Candida tropicalis NOT DETECTED NOT DETECTED   Cryptococcus neoformans/gattii NOT DETECTED NOT DETECTED   Meth resistant mecA/C and MREJ DETECTED (A) NOT DETECTED    Sherlon Handing, PharmD, BCPS Please see amion for complete clinical pharmacist phone list 12/17/2021  4:32 PM

## 2021-12-17 NOTE — ED Notes (Signed)
Patient transported to hemodialysis. Report given to [facility]. Consent obtained prior to transport.

## 2021-12-17 NOTE — ED Notes (Signed)
Admitting MD at the bedside.  

## 2021-12-17 NOTE — ED Notes (Signed)
Pt back from CT

## 2021-12-18 ENCOUNTER — Inpatient Hospital Stay (HOSPITAL_COMMUNITY): Payer: Medicare HMO

## 2021-12-18 DIAGNOSIS — R6520 Severe sepsis without septic shock: Secondary | ICD-10-CM | POA: Diagnosis not present

## 2021-12-18 DIAGNOSIS — I120 Hypertensive chronic kidney disease with stage 5 chronic kidney disease or end stage renal disease: Secondary | ICD-10-CM

## 2021-12-18 DIAGNOSIS — R7881 Bacteremia: Secondary | ICD-10-CM

## 2021-12-18 DIAGNOSIS — I10 Essential (primary) hypertension: Secondary | ICD-10-CM | POA: Diagnosis not present

## 2021-12-18 DIAGNOSIS — N186 End stage renal disease: Secondary | ICD-10-CM | POA: Diagnosis not present

## 2021-12-18 DIAGNOSIS — A4102 Sepsis due to Methicillin resistant Staphylococcus aureus: Principal | ICD-10-CM

## 2021-12-18 DIAGNOSIS — A419 Sepsis, unspecified organism: Secondary | ICD-10-CM | POA: Diagnosis not present

## 2021-12-18 DIAGNOSIS — B9562 Methicillin resistant Staphylococcus aureus infection as the cause of diseases classified elsewhere: Secondary | ICD-10-CM

## 2021-12-18 LAB — ECHOCARDIOGRAM COMPLETE
AR max vel: 2.99 cm2
AV Area VTI: 3.19 cm2
AV Area mean vel: 3.13 cm2
AV Mean grad: 7 mmHg
AV Peak grad: 12.7 mmHg
Ao pk vel: 1.78 m/s
Area-P 1/2: 4.41 cm2
Height: 67 in
S' Lateral: 2.8 cm
Weight: 2432 oz

## 2021-12-18 LAB — RENAL FUNCTION PANEL
Albumin: 2.2 g/dL — ABNORMAL LOW (ref 3.5–5.0)
Anion gap: 13 (ref 5–15)
BUN: 40 mg/dL — ABNORMAL HIGH (ref 8–23)
CO2: 21 mmol/L — ABNORMAL LOW (ref 22–32)
Calcium: 8.9 mg/dL (ref 8.9–10.3)
Chloride: 98 mmol/L (ref 98–111)
Creatinine, Ser: 5.93 mg/dL — ABNORMAL HIGH (ref 0.44–1.00)
GFR, Estimated: 7 mL/min — ABNORMAL LOW (ref >60)
Glucose, Bld: 93 mg/dL (ref 70–99)
Phosphorus: 5.8 mg/dL — ABNORMAL HIGH (ref 2.5–4.6)
Potassium: 3.5 mmol/L (ref 3.5–5.1)
Sodium: 132 mmol/L — ABNORMAL LOW (ref 135–145)

## 2021-12-18 LAB — HEPATITIS B SURFACE ANTIBODY, QUANTITATIVE: Hep B S AB Quant (Post): <3.1 m[IU]/mL — ABNORMAL LOW (ref >9.9)

## 2021-12-18 MED ORDER — LORAZEPAM 2 MG/ML IJ SOLN: 0.5000 mg | Freq: Once | INTRAMUSCULAR | Status: DC | PRN

## 2021-12-18 MED ORDER — VANCOMYCIN HCL 750 MG/150ML IV SOLN
750.0000 mg | Freq: Once | INTRAVENOUS | Status: AC
Start: 2021-12-18 — End: 2021-12-18
  Administered 2021-12-18: 750 mg via INTRAVENOUS
  Filled 2021-12-18: qty 150

## 2021-12-18 MED ORDER — LORAZEPAM 2 MG/ML IJ SOLN
0.5000 mg | Freq: Once | INTRAMUSCULAR | Status: AC | PRN
Start: 2021-12-18 — End: 2021-12-18
  Administered 2021-12-18: 0.5 mg via INTRAVENOUS
  Filled 2021-12-18: qty 1

## 2021-12-18 NOTE — Progress Notes (Signed)
PROGRESS NOTE    Diamond [name]  DVV:[id number] DOB: 02/14/1951 DOA: 12/16/2021 PCP: [name], MD   Chief Complaint  Patient presents with   Back Pain    Brief Narrative:   Diamond [name] is a 71 y.o. female with medical history significant for incisional disease on hemodialysis on Tuesday, Thursday, Saturday schedule, mild intermittent asthma, hypertension, anemia of chronic kidney disease, who is admitted to [facility] on 12/16/2021 with severe sepsis after presenting from home to [facility] ED complaining of right-sided back pain.  Her work-up was significant for sepsis, blood pressure came back growing MRSA.      Assessment & Plan:   Principal Problem:   Severe sepsis ([place]) Active Problems:   Hypertension   Anemia of chronic disease   Leukocytosis   Lactic acidosis   Low back pain   ESRD (end stage renal disease) (HCC)   Mild intermittent asthma   Sepsis, present on admission, due to bacteremia -Present on admission, leukocytosis 20 K, tachycardic 109, tachypneic 26, with elevated lactic acid of 2.7. -Blood culture showing MRSA , currently on IV vancomycin -As well possible urinary source as evidence of cystitis on imaging. -Follow on 2D echo, if negative will need TEE. -Complaining of lower back pain, MRI lumbar /thoracic spine is ordered, will need as needed Ativan, will be without IV contrast given ESRD. -ID input greatly appreciated.   End-stage renal disease:  - On hemodialysis on Tuesday, Thursday, Saturday schedule.  Renal consulted     Mild intermittent asthma:  -  Continue home prn albuterol inhaler.  Add on serum magnesium level.   Anemia of chronic kidney disease:  -Procrit and IV iron per renal      Hypertension:  -Continue with verapamil, holding hydrochlorothiazide, will add as needed hydralazine.         DVT prophylaxis: Heparin Code Status: Full Family Communication: none at bedside, I have discussed with patient at length and  answered her questions. Disposition:   Status is: Inpatient  The patient will require care spanning > 2 midnights and should be moved to inpatient because: Now she is with a gram-positive cocci bacteremia, in need of IV antibiotic and further work-up.      Consultants:  renal   Subjective:  Reported generalized body ache, weakness and fatigue.  Reports lower back pain.  Objective: Vitals:   12/17/21 2100 12/17/21 2339 12/18/21 0328 12/18/21 0820  BP:  127/77 138/83 (!) 148/83  Pulse:  89 90 81  Resp: 15 (!) 21 (!) 22 20  Temp:  99.1 F (37.3 C) 99.4 F (37.4 C) 99.8 F (37.7 C)  TempSrc:  Oral Oral Oral  SpO2:  98% 96% 99%  Weight:      Height:        Intake/Output Summary (Last 24 hours) at 12/18/2021 1100 Last data filed at 12/17/2021 1515 Gross per 24 hour  Intake --  Output 2000 ml  Net -2000 ml   Filed Weights   12/16/21 1437  Weight: 68.9 kg    Examination:  Awake Alert, Oriented X 3, No new F.N deficits, Normal affect Symmetrical Chest wall movement, Good air movement bilaterally, CTAB RRR,No Gallops,Rubs or new Murmurs, No Parasternal Heave +ve B.Sounds, Abd Soft, No tenderness, No rebound - guarding or rigidity. Lower back tenderness to palpation No Cyanosis, Clubbing or edema, No new Rash or bruise        Data Reviewed: I have personally reviewed following labs and imaging studies  CBC: Recent  Labs  Lab 12/16/21 1450 12/17/21 0554  WBC 20.5* 20.3*  NEUTROABS 19.0* 18.3*  HGB 12.5 11.3*  HCT 39.0 36.0  MCV 97.5 100.8*  PLT 210 481    Basic Metabolic Panel: Recent Labs  Lab 12/16/21 1450 12/17/21 0554 12/18/21 0736  NA 130* 133* 132*  K 4.5 5.4* 3.5  CL 97* 94* 98  CO2 15* 14* 21*  GLUCOSE 102* 82 93  BUN 73* 80* 40*  CREATININE 9.73* 9.58* 5.93*  CALCIUM 9.9 8.9 8.9  MG  --  1.9  --   PHOS  --  7.4* 5.8*    GFR: Estimated Creatinine Clearance: 8.3 mL/min (A) (by C-G formula based on SCr of 5.93 mg/dL (H)).  Liver  Function Tests: Recent Labs  Lab 12/16/21 1450 12/17/21 0554 12/17/21 1251 12/17/21 1437 12/18/21 0736  AST 15 16 18  11*  --   ALT 10 8 7 8   --   ALKPHOS 71 92 67 80  --   BILITOT 1.1 1.3* 1.6* 1.1  --   PROT 7.2 6.3* 5.5* 6.7  --   ALBUMIN 3.0* 2.5* 2.4* 2.5* 2.2*    CBG: No results for input(s): GLUCAP in the last 168 hours.   Recent Results (from the past 240 hour(s))  Resp Panel by RT-PCR (Flu A&B, Covid) Nasopharyngeal Swab     Status: None   Collection Time: 12/16/21  6:02 PM   Specimen: Nasopharyngeal Swab; Nasopharyngeal(NP) swabs in vial transport medium  Result Value Ref Range Status   SARS Coronavirus 2 by RT PCR NEGATIVE NEGATIVE Final    Comment: (NOTE) SARS-CoV-2 target nucleic acids are NOT DETECTED.  The SARS-CoV-2 RNA is generally detectable in upper respiratory specimens during the acute phase of infection. The lowest concentration of SARS-CoV-2 viral copies this assay can detect is 138 copies/mL. A negative result does not preclude SARS-Cov-2 infection and should not be used as the sole basis for treatment or other patient management decisions. A negative result may occur with  improper specimen collection/handling, submission of specimen other than nasopharyngeal swab, presence of viral mutation(s) within the areas targeted by this assay, and inadequate number of viral copies(<138 copies/mL). A negative result must be combined with clinical observations, patient history, and epidemiological information. The expected result is Negative.  Fact Sheet for Patients:  EntrepreneurPulse.com.au  Fact Sheet for Healthcare Providers:  IncredibleEmployment.be  This test is no t yet approved or cleared by the [place] FDA and  has been authorized for detection and/or diagnosis of SARS-CoV-2 by FDA under an Emergency Use Authorization (EUA). This EUA will remain  in effect (meaning this test can be used) for the  duration of the COVID-19 declaration under Section 564(b)(1) of the Act, 21 U.S.C.section 360bbb-3(b)(1), unless the authorization is terminated  or revoked sooner.       Influenza A by PCR NEGATIVE NEGATIVE Final   Influenza B by PCR NEGATIVE NEGATIVE Final    Comment: (NOTE) The Xpert Xpress SARS-CoV-2/FLU/RSV plus assay is intended as an aid in the diagnosis of influenza from Nasopharyngeal swab specimens and should not be used as a sole basis for treatment. Nasal washings and aspirates are unacceptable for Xpert Xpress SARS-CoV-2/FLU/RSV testing.  Fact Sheet for Patients: EntrepreneurPulse.com.au  Fact Sheet for Healthcare Providers: IncredibleEmployment.be  This test is not yet approved or cleared by the [place] FDA and has been authorized for detection and/or diagnosis of SARS-CoV-2 by FDA under an Emergency Use Authorization (EUA). This EUA will remain in effect (meaning this  test can be used) for the duration of the COVID-19 declaration under Section 564(b)(1) of the Act, 21 U.S.C. section 360bbb-3(b)(1), unless the authorization is terminated or revoked.  Performed at [facility], [place] [address], [place], [place] [address]   Urine Culture     Status: None   Collection Time: 12/16/21 11:49 PM   Specimen: Urine, Clean Catch  Result Value Ref Range Status   Specimen Description URINE, CLEAN CATCH  Final   Special Requests NONE  Final   Culture   Final    NO GROWTH Performed at [facility], [place] [address], [place], [place] [address]    Report Status 12/17/2021 FINAL  Final  Culture, blood (routine x 2)     Status: Abnormal (Preliminary result)   Collection Time: 12/17/21  1:14 AM   Specimen: BLOOD LEFT HAND  Result Value Ref Range Status   Specimen Description BLOOD LEFT HAND  Final   Special Requests   Final    BOTTLES DRAWN AEROBIC AND ANAEROBIC Blood Culture adequate volume   Culture  Setup Time   Final     GRAM POSITIVE COCCI IN CLUSTERS IN BOTH AEROBIC AND ANAEROBIC BOTTLES CRITICAL VALUE NOTED.  VALUE IS CONSISTENT WITH PREVIOUSLY REPORTED AND CALLED VALUE. Performed at [facility], [place] [address] [place],  [address]    Culture STAPHYLOCOCCUS AUREUS (A)  Final   Report Status PENDING  Incomplete  Culture, blood (routine x 2)     Status: Abnormal (Preliminary result)   Collection Time: 12/17/21  1:19 AM   Specimen: BLOOD  Result Value Ref Range Status   Specimen Description BLOOD SITE NOT SPECIFIED  Final   Special Requests   Final    BOTTLES DRAWN AEROBIC AND ANAEROBIC Blood Culture results may not be optimal due to an inadequate volume of blood received in culture bottles   Culture  Setup Time   Final    GRAM POSITIVE COCCI IN CLUSTERS IN BOTH AEROBIC AND ANAEROBIC BOTTLES CRITICAL RESULT CALLED TO, READ BACK BY AND VERIFIED WITH: C AMEND PHARMD [id number] 12/17/21 A BROWNING    Culture (A)  Final    STAPHYLOCOCCUS AUREUS SUSCEPTIBILITIES TO FOLLOW Performed at [facility], [place] [address] [place],  [address]    Report Status PENDING  Incomplete  Blood Culture ID Panel (Reflexed)     Status: Abnormal   Collection Time: 12/17/21  1:19 AM  Result Value Ref Range Status   Enterococcus faecalis NOT DETECTED NOT DETECTED Final   Enterococcus Faecium NOT DETECTED NOT DETECTED Final   Listeria monocytogenes NOT DETECTED NOT DETECTED Final   Staphylococcus species DETECTED (A) NOT DETECTED Final    Comment: CRITICAL RESULT CALLED TO, READ BACK BY AND VERIFIED WITH: C AMEND PHARMD 1613 12/17/21 A BROWNING    Staphylococcus aureus (BCID) DETECTED (A) NOT DETECTED Final    Comment: Methicillin (oxacillin)-resistant Staphylococcus aureus (MRSA). MRSA is predictably resistant to beta-lactam antibiotics (except ceftaroline). Preferred therapy is vancomycin unless clinically contraindicated. Patient requires contact precautions if  hospitalized. CRITICAL RESULT CALLED  TO, READ BACK BY AND VERIFIED WITH: C AMEND PHARMD [id number] 12/17/21 A BROWNING    Staphylococcus epidermidis NOT DETECTED NOT DETECTED Final   Staphylococcus lugdunensis NOT DETECTED NOT DETECTED Final   Streptococcus species NOT DETECTED NOT DETECTED Final   Streptococcus agalactiae NOT DETECTED NOT DETECTED Final   Streptococcus pneumoniae NOT DETECTED NOT DETECTED Final   Streptococcus pyogenes NOT DETECTED NOT DETECTED Final  A.calcoaceticus-baumannii NOT DETECTED NOT DETECTED Final   Bacteroides fragilis NOT DETECTED NOT DETECTED Final   Enterobacterales NOT DETECTED NOT DETECTED Final   Enterobacter cloacae complex NOT DETECTED NOT DETECTED Final   Escherichia coli NOT DETECTED NOT DETECTED Final   Klebsiella aerogenes NOT DETECTED NOT DETECTED Final   Klebsiella oxytoca NOT DETECTED NOT DETECTED Final   Klebsiella pneumoniae NOT DETECTED NOT DETECTED Final   Proteus species NOT DETECTED NOT DETECTED Final   Salmonella species NOT DETECTED NOT DETECTED Final   Serratia marcescens NOT DETECTED NOT DETECTED Final   Haemophilus influenzae NOT DETECTED NOT DETECTED Final   Neisseria meningitidis NOT DETECTED NOT DETECTED Final   Pseudomonas aeruginosa NOT DETECTED NOT DETECTED Final   Stenotrophomonas maltophilia NOT DETECTED NOT DETECTED Final   Candida albicans NOT DETECTED NOT DETECTED Final   Candida auris NOT DETECTED NOT DETECTED Final   Candida glabrata NOT DETECTED NOT DETECTED Final   Candida krusei NOT DETECTED NOT DETECTED Final   Candida parapsilosis NOT DETECTED NOT DETECTED Final   Candida tropicalis NOT DETECTED NOT DETECTED Final   Cryptococcus neoformans/gattii NOT DETECTED NOT DETECTED Final   Meth resistant mecA/C and MREJ DETECTED (A) NOT DETECTED Final    Comment: CRITICAL RESULT CALLED TO, READ BACK BY AND VERIFIED WITHLoletha Grayer AMEND PHARMD 1613 12/17/21 A BROWNING Performed at [facility], [address] [place], [place] [address]          Radiology  Studies: DG Chest 2 View  Result Date: 12/17/2021 CLINICAL DATA:  Short S of breath, back pain EXAM: CHEST - 2 VIEW COMPARISON:  11/05/2019 FINDINGS: Right dialysis catheter tip in the right atrium. Mild cardiomegaly. No confluent opacities, effusions or edema. No acute bony abnormality. IMPRESSION: Mild cardiomegaly.  No active disease. Electronically Signed   By: Rolm Baptise M.D.   On: 12/17/2021 01:54   CT Renal Stone Study  Result Date: 12/17/2021 CLINICAL DATA:  Dialysis patient with flank pain. Kidney stone suspected. EXAM: CT ABDOMEN AND PELVIS WITHOUT CONTRAST TECHNIQUE: Multidetector CT imaging of the abdomen and pelvis was performed following the standard protocol without IV contrast. RADIATION DOSE REDUCTION: This exam was performed according to the departmental dose-optimization program which includes automated exposure control, adjustment of the mA and/or kV according to patient size and/or use of iterative reconstruction technique. COMPARISON:  CT without contrast 10/01/2021. FINDINGS: Lower chest: Above plane of the prior study, there is an ovoid well-circumscribed low-density right middle lobe 10.6 x 7.0 mm nodule laterally on axial image 3 of series 4, with pleural stranding. This was not present on prior chest CT of 10/15/2010 therefore is indeterminate in significance. There are few linear scar-like opacities in the lung bases but no active infiltrates. Cardiac size is normal. There are coronary artery calcifications. No pericardial fluid. Hepatobiliary: As previously there are air containing cholesterol stones in the gallbladder up to 2.5 cm in diameter. Equivocal mild thickening of the fundal wall but no pericholecystic edema or fluid is seen and no biliary dilatation. No focal abnormality of unenhanced liver. Pancreas: Unremarkable without contrast.  No adjacent edema. Spleen: Unremarkable without contrast apart from calcified granulomas. Adrenals/Urinary Tract: There is no adrenal  mass. There are small renal cysts. Benign cortical calcification of the lower lateral right kidney is again noted. No intrarenal stone is seen and no ureteral stones or hydronephrosis. Mild bladder thickening versus underdistention. Stomach/Bowel: No dilatation or wall thickening including the appendix. There is fluid in the colon in the ascending and  transverse segments. Vascular/Lymphatic: Aortic atherosclerosis. No enlarged abdominal or pelvic lymph nodes. Reproductive: The uterus is intact. There are vascular calcifications in the outer wall. The ovaries are not enlarged. Other: There is no free air, hemorrhage or fluid. Small umbilical fat hernia. Musculoskeletal: Transitional anatomy at L5 with sacralization. Osteopenia and degenerative changes are noted with reactive endplate sclerosis at Z3-6, chronic disc collapse. At L2, there previously appeared to be a nondisplaced longitudinal fracture through the anterior column of the vertebral body, which appears to have largely healed in the interval but with prominent endplate Schmorl's node in the superior vertebral body now seen. There is only a slight loss of the anterior vertebral height at this level. Reactive sclerosis in the L2 vertebral body is consistent with a healing response. IMPRESSION: 1. No urinary stones or obstruction on the current and prior studies. 2. Cholelithiasis with equivocal thickening of the fundal wall but no pericholecystic edema or fluid. This was also seen previously. If there are localizing symptoms, ultrasound may be helpful. 3. Cystitis versus bladder nondistention. 4. Fluid in the ascending and transverse colon which could be due to a diarrheal enteritis. No wall thickening or inflammatory change. 5. Previous study with suspected longitudinal nondisplaced fracture of the anterior column of L2. The fracture line is no longer visible. There is a prominent upper plate Schmorl's node now seen and only slight loss of anterior vertebral  body height, without retropulsion. Marrow sclerosis in the vertebral body is consistent with healing response. 6. Aortic atherosclerosis. 7. 10.6 x 7.0 mm well-circumscribed oval right middle lobe nodule above the plane of the recent prior abdomen and pelvis CT and not seen on a 2011 chest CT. Three-month follow-up CT or PET-CT recommended. Electronically Signed   By: Telford Nab M.D.   On: 12/17/2021 00:32   US Abdomen Limited RUQ (LIVER/GB)  Result Date: 12/17/2021 CLINICAL DATA:  Back pain EXAM: ULTRASOUND ABDOMEN LIMITED RIGHT UPPER QUADRANT COMPARISON:  CT earlier today FINDINGS: Gallbladder: Multiple stones fill the gallbladder, measuring up to 2.3 cm. Gallbladder wall is borderline in thickness at 3 mm. Negative sonographic Murphy's. Common bile duct: Diameter: Mildly dilated at 9 mm, tapering distally. Liver: No focal lesion identified. Within normal limits in parenchymal echogenicity. Portal vein is patent on color Doppler imaging with normal direction of blood flow towards the liver. Other: None. IMPRESSION: Cholelithiasis. No convincing sonographic evidence of acute cholecystitis. Common bile duct is mildly dilated proximally, then tapers distally. Recommend correlation with LFTs. Electronically Signed   By: Rolm Baptise M.D.   On: 12/17/2021 01:35        Scheduled Meds:  Chlorhexidine Gluconate Cloth  6 each Topical Q0600   doxercalciferol  4 mcg Intravenous Q T,Th,Sa-HD   heparin injection (subcutaneous)  5,000 Units Subcutaneous Q8H   verapamil  120 mg Oral Q8H   Continuous Infusions:  ferric gluconate (FERRLECIT) IVPB     vancomycin     vancomycin 750 mg (12/18/21 1038)     LOS: 1 day       Phillips Climes, MD [facility]   To contact the attending provider between 7A-7P or the covering provider during after hours 7P-7A, please log into the web site [email or web address] and access using universal [place] password for that web site. If you do not have the  password, please call the hospital operator.  12/18/2021, 11:00 AM   Patient ID: Diamond [name], female   DOB: 01-30-1951, 71 y.o.   MRN: [id number] Patient ID: Diamond [name],  female   DOB: Oct 24, 1951, 71 y.o.   MRN: [id number]

## 2021-12-18 NOTE — Progress Notes (Signed)
Subjective: Seen in room ,no complaints" feels better than yesterday" tolerated dialysis yesterday on schedule . Today tells me she will agree to have arm access placed, and "actually has OP  appointment January 16 with Dr. Luan Pulling [facility]"   Objective Vital signs in last 24 hours: Vitals:   12/17/21 2339 12/18/21 0328 12/18/21 0820 12/18/21 1224  BP: 127/77 138/83 (!) 148/83 (!) 148/89  Pulse: 89 90 81 83  Resp: (!) 21 (!) 22 20 20   Temp: 99.1 F (37.3 C) 99.4 F (37.4 C) 99.8 F (37.7 C) 99.8 F (37.7 C)  TempSrc: Oral Oral Oral Oral  SpO2: 98% 96% 99% 94%  Weight:      Height:       Weight change:   Physical Exam: General: Alert elderly female in NAD Heart: RRR, soft 1/6 SEM no rub or gallop Lungs: CTA nonlabored breathing Abdomen: NABS, soft NTND no ascites Extremities: No pedal edema Dialysis Access: Right IJ PermCath dressing intact dry and clean  Home meds include - zyloprim, colchicine, verapamil 120 tid, ultram prn, albuterol prn hfa, prns/ vits/ supps     OP HD: [facility]  4h  400/600  68.5kg  3K/2 bath  Hep 2000  access ?   - hect 4 ug tiw  - venofer 50 q wk  - no esa, last Hb 11.9   Problem/Plan: Sepsis, MRSA bacteremia= possible source =PermCath ,ID seeing recommended vancomycin goal trough 15-20 follow-up blood cultures from 01/1 2 repeated blood cultures ordered 01/14 for TTE, MRI T, L spine, ESRD -HD TTS on schedule next dialysis tomorrow HTN/volume -no excess volume, yesterday 2 L UF on HD needs wts  postdialysis verapamil continue mild hypertension today UF with dialysis tomorrow as able May need lower dry weight with weight loss sepsis Anemia -Hgb 11.5 no ESA needs, DC weekly Venofer with sepsis Secondary hyperparathyroidism -COrec calcium 10.1 phosphorus 7.4 continue binders vitamin D on machine follow-up calcium/phosphorus labs Right-sided pulmonary nodule= follow-up intervention per primary/pulm Lower back pain tenderness in lower thoracic and  lumbar spine= is better today, plan for admit team  Ernest Haber, PA-C [facility] [phone] 12/18/2021,3:09 PM  LOS: 1 day   Labs: Basic Metabolic Panel: Recent Labs  Lab 12/16/21 1450 12/17/21 0554 12/18/21 0736  NA 130* 133* 132*  K 4.5 5.4* 3.5  CL 97* 94* 98  CO2 15* 14* 21*  GLUCOSE 102* 82 93  BUN 73* 80* 40*  CREATININE 9.73* 9.58* 5.93*  CALCIUM 9.9 8.9 8.9  PHOS  --  7.4* 5.8*   Liver Function Tests: Recent Labs  Lab 12/17/21 0554 12/17/21 1251 12/17/21 1437 12/18/21 0736  AST 16 18 11*  --   ALT 8 7 8   --   ALKPHOS 92 67 80  --   BILITOT 1.3* 1.6* 1.1  --   PROT 6.3* 5.5* 6.7  --   ALBUMIN 2.5* 2.4* 2.5* 2.2*   No results for input(s): LIPASE, AMYLASE in the last 168 hours. No results for input(s): AMMONIA in the last 168 hours. CBC: Recent Labs  Lab 12/16/21 1450 12/17/21 0554  WBC 20.5* 20.3*  NEUTROABS 19.0* 18.3*  HGB 12.5 11.3*  HCT 39.0 36.0  MCV 97.5 100.8*  PLT 210 153   Cardiac Enzymes: No results for input(s): CKTOTAL, CKMB, CKMBINDEX, TROPONINI in the last 168 hours. CBG: No results for input(s): GLUCAP in the last 168 hours.  Studies/Results:  Medications:  ferric gluconate (FERRLECIT) IVPB     vancomycin  Chlorhexidine Gluconate Cloth  6 each Topical Q0600   doxercalciferol  4 mcg Intravenous Q T,Th,Sa-HD   heparin injection (subcutaneous)  5,000 Units Subcutaneous Q8H   verapamil  120 mg Oral Q8H

## 2021-12-18 NOTE — Progress Notes (Signed)
Echocardiogram 2D Echocardiogram has been performed.  Arlyss Gandy 12/18/2021, 1:22 PM

## 2021-12-18 NOTE — Progress Notes (Addendum)
[facility]                                                                                        Patient Identification: Patient Name: Diamond [name] MRN: [id number] [place] Date: 12/16/2021  2:15 PM Today's Date: 12/18/2021 Reason for consult: MRSA bacteremia  Requesting provider: Emeline Gins Elgergawy  Principal Problem:   Severe sepsis [facility]) Active Problems:   Hypertension   Anemia of chronic disease   Leukocytosis   Lactic acidosis   Low back pain   ESRD (end stage renal disease) (HCC)   Mild intermittent asthma   Antibiotics: Vancomycin 1/11-                    Cefepime 1/11-                     Metronidazole 1/11-  Lines/Hardware: PIV, external uretheral catheter  Assessment # High Grade MRSA bacteremia # ESRD on HD via Rt chest HD catheter- complains of soreness in the HD catheter site. No signs of cellulitis/exit site infection on exam - No other hardware - No signs and symptoms of septic peripheral joints  # Lower back pain - tenderness in lower thoracic and lumbar spine  # Diarrhea - resolved  # Rt sided pulmonary nodule - fu imaging per primary/Pulm   Recommendations  Continue Vancomycin, pharmacy to dose, goal trough 15-20 Fu blood cultures 1/12 for ID and sensi Repeat blood cx on 1/14 ( ordered) Replace HD catheter, needs line holiday Fu TTE, if unremarkable needs TEE MRI T L spine Monitor CBC, BMP and Vancomycin trough   Addendum TTE with concerns for vegetation. Recommend TEE- planned for next Tuesday   Dr Candiss Norse on call this weekend, available wth questions. New ID team will follow on Monday   Rest of the management as per the primary team. Please call with questions or concerns.  Thank you for the consult  Rosiland Oz, MD Infectious Disease Physician [facility] [address] [place], [place]  [address] Phone: [phone]   Fax: [phone]  __________________________________________________________________________________________________________ HPI and Hospital Course: 71 year old female with PMH of ESRD on HD, Asthma, HTN, Gout who presented to the ED on 1/10 with complaint of right-sided back pain and pain in the bilateral lower extremities for a week. Complains of chills but denies fevers. Nauseous and had loose stools but denies vomiting. Denies stool and urinary incontinence. Says HD catheter in the RT chest was placed 5 months ago but feels soreness at catheter site lately. Denies pain in other joints. Denies any other hardware.   At ED, a febrile, WBC 20.3 Blood cultures 1/12 gram-positive cocci in clusters in both sets.  BCID identified as MRSA Imaging as below  ROS: General- Denies fever, loss of appetite and loss of weight, chills + HEENT - Denies headache, blurry vision, neck pain, sinus pain Chest - Denies any chest pain, SOB or cough CVS- Denies any dizziness/lightheadedness, syncopal attacks, palpitations Abdomen- Denies any vomiting, abdominal pain, hematochezia and diarrhea( resolved), nausea Neuro - Denies any weakness, numbness,  tingling sensation Psych - Denies any changes in mood irritability or depressive symptoms GU- Denies any burning, dysuria, hematuria or increased frequency of urination Skin - denies any rashes/lesions MSK - denies any joint pain/swelling or restricted ROM   Past Medical History:  Diagnosis Date   Asthma    Chronic kidney disease    dialysis Tues Thurs Sat   Dysrhythmia    PAF 10/2019 in setting of COVID-19 PNA   Gout    Hypertension    Past Surgical History:  Procedure Laterality Date   CESAREAN SECTION  1989     Scheduled Meds:  Chlorhexidine Gluconate Cloth  6 each Topical Q0600   doxercalciferol  4 mcg Intravenous Q T,Th,Sa-HD   heparin injection (subcutaneous)  5,000 Units Subcutaneous Q8H   verapamil  120 mg Oral  Q8H   Continuous Infusions:  ferric gluconate (FERRLECIT) IVPB     vancomycin     PRN Meds:.acetaminophen **OR** acetaminophen, albuterol, fentaNYL (SUBLIMAZE) injection, naLOXone (NARCAN)  injection  No Known Allergies  Social History   Socioeconomic History   Marital status: Widowed    Spouse name: Not on file   Number of children: Not on file   Years of education: Not on file   Highest education level: Not on file  Occupational History   Not on file  Tobacco Use   Smoking status: Never   Smokeless tobacco: Never  Vaping Use   Vaping Use: Never used  Substance and Sexual Activity   Alcohol use: No   Drug use: No   Sexual activity: Not Currently    Birth control/protection: Post-menopausal  Other Topics Concern   Not on file  Social History Narrative   Not on file   Social Determinants of Health   Financial Resource Strain: Not on file  Food Insecurity: Not on file  Transportation Needs: Not on file  Physical Activity: Not on file  Stress: Not on file  Social Connections: Not on file  Intimate Partner Violence: Not on file   Family History  Problem Relation Age of Onset   Hypertension Mother    Hypertension Father    Colon cancer Brother    Lung cancer Brother     Vitals BP (!) 148/83 (BP Location: Right Arm)    Pulse 81    Temp 99.8 F (37.7 C) (Oral)    Resp 20    Ht 5\' 7"  (1.702 m)    Wt 68.9 kg    SpO2 99%    BMI 23.81 kg/m    Physical Exam Constitutional:  lying in the bed     Comments:   Cardiovascular:     Rate and Rhythm: Normal rate and regular rhythm.     Heart sounds: systolic murmur +  Pulmonary:     Effort: Pulmonary effort is normal.     Comments: clear lung sounds bilaterally  Abdominal:     Palpations: Abdomen is soft.     Tenderness: Non tender and non distended   Musculoskeletal:        General: No swelling or tenderness.      Lower thoracic and lumbar tenderness  Neurological:     General: grossly non focal    Psychiatric:        Mood and Affect: Mood normal.   Pertinent Microbiology Results for orders placed or performed during the hospital encounter of 12/16/21  Resp Panel by RT-PCR (Flu A&B, Covid) Nasopharyngeal Swab     Status: None   Collection Time: 12/16/21  6:02 PM   Specimen: Nasopharyngeal Swab; Nasopharyngeal(NP) swabs in vial transport medium  Result Value Ref Range Status   SARS Coronavirus 2 by RT PCR NEGATIVE NEGATIVE Final    Comment: (NOTE) SARS-CoV-2 target nucleic acids are NOT DETECTED.  The SARS-CoV-2 RNA is generally detectable in upper respiratory specimens during the acute phase of infection. The lowest concentration of SARS-CoV-2 viral copies this assay can detect is 138 copies/mL. A negative result does not preclude SARS-Cov-2 infection and should not be used as the sole basis for treatment or other patient management decisions. A negative result may occur with  improper specimen collection/handling, submission of specimen other than nasopharyngeal swab, presence of viral mutation(s) within the areas targeted by this assay, and inadequate number of viral copies(<138 copies/mL). A negative result must be combined with clinical observations, patient history, and epidemiological information. The expected result is Negative.  Fact Sheet for Patients:  EntrepreneurPulse.com.au  Fact Sheet for Healthcare Providers:  IncredibleEmployment.be  This test is no t yet approved or cleared by the [place] FDA and  has been authorized for detection and/or diagnosis of SARS-CoV-2 by FDA under an Emergency Use Authorization (EUA). This EUA will remain  in effect (meaning this test can be used) for the duration of the COVID-19 declaration under Section 564(b)(1) of the Act, 21 U.S.C.section 360bbb-3(b)(1), unless the authorization is terminated  or revoked sooner.       Influenza A by PCR NEGATIVE NEGATIVE Final   Influenza B  by PCR NEGATIVE NEGATIVE Final    Comment: (NOTE) The Xpert Xpress SARS-CoV-2/FLU/RSV plus assay is intended as an aid in the diagnosis of influenza from Nasopharyngeal swab specimens and should not be used as a sole basis for treatment. Nasal washings and aspirates are unacceptable for Xpert Xpress SARS-CoV-2/FLU/RSV testing.  Fact Sheet for Patients: EntrepreneurPulse.com.au  Fact Sheet for Healthcare Providers: IncredibleEmployment.be  This test is not yet approved or cleared by the [place] FDA and has been authorized for detection and/or diagnosis of SARS-CoV-2 by FDA under an Emergency Use Authorization (EUA). This EUA will remain in effect (meaning this test can be used) for the duration of the COVID-19 declaration under Section 564(b)(1) of the Act, 21 U.S.C. section 360bbb-3(b)(1), unless the authorization is terminated or revoked.  Performed at [facility], [place] [address] [place], [place] [address]   Urine Culture     Status: None   Collection Time: 12/16/21 11:49 PM   Specimen: Urine, Clean Catch  Result Value Ref Range Status   Specimen Description URINE, CLEAN CATCH  Final   Special Requests NONE  Final   Culture   Final    NO GROWTH Performed at [facility], [place] [address], [place], [place] [address]    Report Status 12/17/2021 FINAL  Final  Culture, blood (routine x 2)     Status: None (Preliminary result)   Collection Time: 12/17/21  1:14 AM   Specimen: BLOOD LEFT HAND  Result Value Ref Range Status   Specimen Description BLOOD LEFT HAND  Final   Special Requests   Final    BOTTLES DRAWN AEROBIC AND ANAEROBIC Blood Culture adequate volume   Culture  Setup Time   Final    GRAM POSITIVE COCCI IN CLUSTERS IN BOTH AEROBIC AND ANAEROBIC BOTTLES CRITICAL VALUE NOTED.  VALUE IS CONSISTENT WITH PREVIOUSLY REPORTED AND CALLED VALUE. Performed at [facility], [place] [address], [place], [place] [address]     Culture PENDING  Incomplete  Report Status PENDING  Incomplete  Culture, blood (routine x 2)     Status: None (Preliminary result)   Collection Time: 12/17/21  1:19 AM   Specimen: BLOOD  Result Value Ref Range Status   Specimen Description BLOOD SITE NOT SPECIFIED  Final   Special Requests   Final    BOTTLES DRAWN AEROBIC AND ANAEROBIC Blood Culture results may not be optimal due to an inadequate volume of blood received in culture bottles   Culture  Setup Time   Final    GRAM POSITIVE COCCI IN CLUSTERS IN BOTH AEROBIC AND ANAEROBIC BOTTLES Organism ID to follow CRITICAL RESULT CALLED TO, READ BACK BY AND VERIFIED WITHLoletha Grayer AMEND PHARMD 1613 12/17/21 A BROWNING Performed at [facility], [place] [address] [place], [place] [address]    Culture GRAM POSITIVE COCCI  Final   Report Status PENDING  Incomplete  Blood Culture ID Panel (Reflexed)     Status: Abnormal   Collection Time: 12/17/21  1:19 AM  Result Value Ref Range Status   Enterococcus faecalis NOT DETECTED NOT DETECTED Final   Enterococcus Faecium NOT DETECTED NOT DETECTED Final   Listeria monocytogenes NOT DETECTED NOT DETECTED Final   Staphylococcus species DETECTED (A) NOT DETECTED Final    Comment: CRITICAL RESULT CALLED TO, READ BACK BY AND VERIFIED WITH: C AMEND PHARMD 1613 12/17/21 A BROWNING    Staphylococcus aureus (BCID) DETECTED (A) NOT DETECTED Final    Comment: Methicillin (oxacillin)-resistant Staphylococcus aureus (MRSA). MRSA is predictably resistant to beta-lactam antibiotics (except ceftaroline). Preferred therapy is vancomycin unless clinically contraindicated. Patient requires contact precautions if  hospitalized. CRITICAL RESULT CALLED TO, READ BACK BY AND VERIFIED WITH: C AMEND PHARMD 2703 12/17/21 A BROWNING    Staphylococcus epidermidis NOT DETECTED NOT DETECTED Final   Staphylococcus lugdunensis NOT DETECTED NOT DETECTED Final   Streptococcus species NOT DETECTED NOT DETECTED Final    Streptococcus agalactiae NOT DETECTED NOT DETECTED Final   Streptococcus pneumoniae NOT DETECTED NOT DETECTED Final   Streptococcus pyogenes NOT DETECTED NOT DETECTED Final   A.calcoaceticus-baumannii NOT DETECTED NOT DETECTED Final   Bacteroides fragilis NOT DETECTED NOT DETECTED Final   Enterobacterales NOT DETECTED NOT DETECTED Final   Enterobacter cloacae complex NOT DETECTED NOT DETECTED Final   Escherichia coli NOT DETECTED NOT DETECTED Final   Klebsiella aerogenes NOT DETECTED NOT DETECTED Final   Klebsiella oxytoca NOT DETECTED NOT DETECTED Final   Klebsiella pneumoniae NOT DETECTED NOT DETECTED Final   Proteus species NOT DETECTED NOT DETECTED Final   Salmonella species NOT DETECTED NOT DETECTED Final   Serratia marcescens NOT DETECTED NOT DETECTED Final   Haemophilus influenzae NOT DETECTED NOT DETECTED Final   Neisseria meningitidis NOT DETECTED NOT DETECTED Final   Pseudomonas aeruginosa NOT DETECTED NOT DETECTED Final   Stenotrophomonas maltophilia NOT DETECTED NOT DETECTED Final   Candida albicans NOT DETECTED NOT DETECTED Final   Candida auris NOT DETECTED NOT DETECTED Final   Candida glabrata NOT DETECTED NOT DETECTED Final   Candida krusei NOT DETECTED NOT DETECTED Final   Candida parapsilosis NOT DETECTED NOT DETECTED Final   Candida tropicalis NOT DETECTED NOT DETECTED Final   Cryptococcus neoformans/gattii NOT DETECTED NOT DETECTED Final   Meth resistant mecA/C and MREJ DETECTED (A) NOT DETECTED Final    Comment: CRITICAL RESULT CALLED TO, READ BACK BY AND VERIFIED WITHLoletha Grayer AMEND PHARMD 1613 12/17/21 A BROWNING Performed at [facility], [address], [place], [place] [address]  Pertinent Lab seen by me: CBC Latest Ref Rng & Units 12/17/2021 12/16/2021 10/01/2021  WBC 4.0 - 10.5 K/uL 20.3(H) 20.5(H) 4.5  Hemoglobin 12.0 - 15.0 g/dL 11.3(L) 12.5 11.3(L)  Hematocrit 36.0 - 46.0 % 36.0 39.0 35.6(L)  Platelets 150 - 400 K/uL 153 210 214   CMP Latest Ref  Rng & Units 12/18/2021 12/17/2021 12/17/2021  Glucose 70 - 99 mg/dL 93 - -  BUN 8 - 23 mg/dL 40(H) - -  Creatinine 0.44 - 1.00 mg/dL 5.93(H) - -  Sodium 135 - 145 mmol/L 132(L) - -  Potassium 3.5 - 5.1 mmol/L 3.5 - -  Chloride 98 - 111 mmol/L 98 - -  CO2 22 - 32 mmol/L 21(L) - -  Calcium 8.9 - 10.3 mg/dL 8.9 - -  Total Protein 6.5 - 8.1 g/dL - 6.7 5.5(L)  Total Bilirubin 0.3 - 1.2 mg/dL - 1.1 1.6(H)  Alkaline Phos 38 - 126 U/L - 80 67  AST 15 - 41 U/L - 11(L) 18  ALT 0 - 44 U/L - 8 7     Pertinent Imagings/Other Imagings Plain films and CT images have been personally visualized and interpreted; radiology reports have been reviewed. Decision making incorporated into the Impression / Recommendations. DG Chest 2 View  Result Date: 12/17/2021 CLINICAL DATA:  Short S of breath, back pain EXAM: CHEST - 2 VIEW COMPARISON:  11/05/2019 FINDINGS: Right dialysis catheter tip in the right atrium. Mild cardiomegaly. No confluent opacities, effusions or edema. No acute bony abnormality. IMPRESSION: Mild cardiomegaly.  No active disease. Electronically Signed   By: Rolm Baptise M.D.   On: 12/17/2021 01:54   DG Lumbar Spine Complete  Result Date: 12/15/2021 CLINICAL DATA:  Provided history: Back pain. EXAM: LUMBAR SPINE - COMPLETE 4+ VIEW COMPARISON:  CT abdomen/pelvis 10/01/2021. radiographs of the lumbar spine 10/01/2021. FINDINGS: Transitional lumbosacral anatomy better appreciated on the prior CT abdomen/pelvis of 10/01/2021. For the purposes of this dictation, the L5 vertebra is sacralized and there is a rudimentary L5-S1 interspace. Mild L3-L4 grade 1 anterolisthesis. Redemonstrated mild chronic L2 superior endplate vertebral compression deformity. Vertebral body height is otherwise maintained. Multilevel disc space narrowing. Most notably, there is moderate/advanced disc space narrowing at L4-L5 with associated degenerative endplate osteophytosis at this level. Multilevel facet arthrosis, greatest  within the lower lumbar spine. IMPRESSION: Transitional lumbosacral anatomy, as described. Unchanged mild chronic L2 superior endplate vertebral compression deformity. Lumbar spondylosis, as described and greatest at L4-L5. Mild L3-L4 grade 1 anterolisthesis. Electronically Signed   By: Kellie Simmering D.O.   On: 12/15/2021 16:42   CT Renal Stone Study  Result Date: 12/17/2021 CLINICAL DATA:  Dialysis patient with flank pain. Kidney stone suspected. EXAM: CT ABDOMEN AND PELVIS WITHOUT CONTRAST TECHNIQUE: Multidetector CT imaging of the abdomen and pelvis was performed following the standard protocol without IV contrast. RADIATION DOSE REDUCTION: This exam was performed according to the departmental dose-optimization program which includes automated exposure control, adjustment of the mA and/or kV according to patient size and/or use of iterative reconstruction technique. COMPARISON:  CT without contrast 10/01/2021. FINDINGS: Lower chest: Above plane of the prior study, there is an ovoid well-circumscribed low-density right middle lobe 10.6 x 7.0 mm nodule laterally on axial image 3 of series 4, with pleural stranding. This was not present on prior chest CT of 10/15/2010 therefore is indeterminate in significance. There are few linear scar-like opacities in the lung bases but no active infiltrates. Cardiac size is normal. There are coronary artery calcifications. No pericardial  fluid. Hepatobiliary: As previously there are air containing cholesterol stones in the gallbladder up to 2.5 cm in diameter. Equivocal mild thickening of the fundal wall but no pericholecystic edema or fluid is seen and no biliary dilatation. No focal abnormality of unenhanced liver. Pancreas: Unremarkable without contrast.  No adjacent edema. Spleen: Unremarkable without contrast apart from calcified granulomas. Adrenals/Urinary Tract: There is no adrenal mass. There are small renal cysts. Benign cortical calcification of the lower lateral  right kidney is again noted. No intrarenal stone is seen and no ureteral stones or hydronephrosis. Mild bladder thickening versus underdistention. Stomach/Bowel: No dilatation or wall thickening including the appendix. There is fluid in the colon in the ascending and transverse segments. Vascular/Lymphatic: Aortic atherosclerosis. No enlarged abdominal or pelvic lymph nodes. Reproductive: The uterus is intact. There are vascular calcifications in the outer wall. The ovaries are not enlarged. Other: There is no free air, hemorrhage or fluid. Small umbilical fat hernia. Musculoskeletal: Transitional anatomy at L5 with sacralization. Osteopenia and degenerative changes are noted with reactive endplate sclerosis at H6-3, chronic disc collapse. At L2, there previously appeared to be a nondisplaced longitudinal fracture through the anterior column of the vertebral body, which appears to have largely healed in the interval but with prominent endplate Schmorl's node in the superior vertebral body now seen. There is only a slight loss of the anterior vertebral height at this level. Reactive sclerosis in the L2 vertebral body is consistent with a healing response. IMPRESSION: 1. No urinary stones or obstruction on the current and prior studies. 2. Cholelithiasis with equivocal thickening of the fundal wall but no pericholecystic edema or fluid. This was also seen previously. If there are localizing symptoms, ultrasound may be helpful. 3. Cystitis versus bladder nondistention. 4. Fluid in the ascending and transverse colon which could be due to a diarrheal enteritis. No wall thickening or inflammatory change. 5. Previous study with suspected longitudinal nondisplaced fracture of the anterior column of L2. The fracture line is no longer visible. There is a prominent upper plate Schmorl's node now seen and only slight loss of anterior vertebral body height, without retropulsion. Marrow sclerosis in the vertebral body is  consistent with healing response. 6. Aortic atherosclerosis. 7. 10.6 x 7.0 mm well-circumscribed oval right middle lobe nodule above the plane of the recent prior abdomen and pelvis CT and not seen on a 2011 chest CT. Three-month follow-up CT or PET-CT recommended. Electronically Signed   By: Telford Nab M.D.   On: 12/17/2021 00:32   US Abdomen Limited RUQ (LIVER/GB)  Result Date: 12/17/2021 CLINICAL DATA:  Back pain EXAM: ULTRASOUND ABDOMEN LIMITED RIGHT UPPER QUADRANT COMPARISON:  CT earlier today FINDINGS: Gallbladder: Multiple stones fill the gallbladder, measuring up to 2.3 cm. Gallbladder wall is borderline in thickness at 3 mm. Negative sonographic Murphy's. Common bile duct: Diameter: Mildly dilated at 9 mm, tapering distally. Liver: No focal lesion identified. Within normal limits in parenchymal echogenicity. Portal vein is patent on color Doppler imaging with normal direction of blood flow towards the liver. Other: None. IMPRESSION: Cholelithiasis. No convincing sonographic evidence of acute cholecystitis. Common bile duct is mildly dilated proximally, then tapers distally. Recommend correlation with LFTs. Electronically Signed   By: Rolm Baptise M.D.   On: 12/17/2021 01:35     I spent more than 80 minutes for this patient encounter including review of prior medical records/discussing diagnostics and treatment plan with the patient/family/coordinate care with primary/other specialits with greater than 50% of time in face to face  encounter.   Electronically signed by:   Rosiland Oz, MD Infectious Disease Physician [facility] Pager: [phone]

## 2021-12-19 DIAGNOSIS — N186 End stage renal disease: Secondary | ICD-10-CM

## 2021-12-19 DIAGNOSIS — Z79899 Other long term (current) drug therapy: Secondary | ICD-10-CM

## 2021-12-19 DIAGNOSIS — T827XXA Infection and inflammatory reaction due to other cardiac and vascular devices, implants and grafts, initial encounter: Secondary | ICD-10-CM

## 2021-12-19 DIAGNOSIS — A4902 Methicillin resistant Staphylococcus aureus infection, unspecified site: Secondary | ICD-10-CM

## 2021-12-19 DIAGNOSIS — R7881 Bacteremia: Secondary | ICD-10-CM | POA: Diagnosis not present

## 2021-12-19 DIAGNOSIS — A419 Sepsis, unspecified organism: Secondary | ICD-10-CM | POA: Diagnosis not present

## 2021-12-19 DIAGNOSIS — R6520 Severe sepsis without septic shock: Secondary | ICD-10-CM | POA: Diagnosis not present

## 2021-12-19 LAB — RENAL FUNCTION PANEL
Albumin: 2 g/dL — ABNORMAL LOW (ref 3.5–5.0)
Anion gap: 18 — ABNORMAL HIGH (ref 5–15)
BUN: 60 mg/dL — ABNORMAL HIGH (ref 8–23)
CO2: 21 mmol/L — ABNORMAL LOW (ref 22–32)
Calcium: 9.5 mg/dL (ref 8.9–10.3)
Chloride: 93 mmol/L — ABNORMAL LOW (ref 98–111)
Creatinine, Ser: 6.98 mg/dL — ABNORMAL HIGH (ref 0.44–1.00)
GFR, Estimated: 6 mL/min — ABNORMAL LOW (ref >60)
Glucose, Bld: 101 mg/dL — ABNORMAL HIGH (ref 70–99)
Phosphorus: 5.9 mg/dL — ABNORMAL HIGH (ref 2.5–4.6)
Potassium: 3.4 mmol/L — ABNORMAL LOW (ref 3.5–5.1)
Sodium: 132 mmol/L — ABNORMAL LOW (ref 135–145)

## 2021-12-19 LAB — CBC
HCT: 30.3 % — ABNORMAL LOW (ref 36.0–46.0)
Hemoglobin: 10.4 g/dL — ABNORMAL LOW (ref 12.0–15.0)
MCH: 31.9 pg (ref 26.0–34.0)
MCHC: 34.3 g/dL (ref 30.0–36.0)
MCV: 92.9 fL (ref 80.0–100.0)
Platelets: 220 10*3/uL (ref 150–400)
RBC: 3.26 MIL/uL — ABNORMAL LOW (ref 3.87–5.11)
RDW: 17.1 % — ABNORMAL HIGH (ref 11.5–15.5)
WBC: 12.2 10*3/uL — ABNORMAL HIGH (ref 4.0–10.5)
nRBC: 0 % (ref 0.0–0.2)

## 2021-12-19 LAB — CULTURE, BLOOD (ROUTINE X 2): Special Requests: ADEQUATE

## 2021-12-19 LAB — MRSA NEXT GEN BY PCR, NASAL: MRSA by PCR Next Gen: DETECTED — AB

## 2021-12-19 MED ORDER — VANCOMYCIN HCL 750 MG/150ML IV SOLN
INTRAVENOUS | Status: AC
  Administered 2021-12-19: 750 mg
  Filled 2021-12-19: qty 150

## 2021-12-19 MED ORDER — LIDOCAINE-EPINEPHRINE 1 %-1:100000 IJ SOLN
20.0000 mL | Freq: Once | INTRAMUSCULAR | Status: AC
  Administered 2021-12-19: 20 mL via INTRADERMAL
  Filled 2021-12-19: qty 20

## 2021-12-19 MED ORDER — MUPIROCIN 2 % EX OINT
1.0000 "application " | TOPICAL_OINTMENT | Freq: Two times a day (BID) | CUTANEOUS | Status: AC
  Administered 2021-12-20 – 2021-12-23 (×7): 1 via NASAL
  Filled 2021-12-19 (×6): qty 22

## 2021-12-19 MED ORDER — HEPARIN SODIUM (PORCINE) 1000 UNIT/ML IJ SOLN
INTRAMUSCULAR | Status: AC
  Administered 2021-12-19: 1000 [IU]
  Filled 2021-12-19: qty 4

## 2021-12-19 NOTE — Consult Note (Signed)
[facility] [place]  ASSESSMENT / PLAN: 71 y.o. female with ESRD dialyzing via RIJ TDC. I have previously offered the patient autogenous access, but she has rescheduled or delayed her surgical appointments so far. She is admitted to the internal medicine service for bacteremia, with likely endocarditis.  We will plan to remove her tunneled dialysis catheter at bedside today.  This will allow a line holiday over the next several days.  She is already scheduled for AV fistula creation on Wednesday.  As long as her blood cultures are negative prior to this, I will place a tunneled dialysis catheter at the same setting.  CHIEF COMPLAINT: Sepsis  HISTORY OF PRESENT ILLNESS: Diamond [name] is a 71 y.o. female who is very well-known to me with end-stage renal disease dialyzing via right internal jugular tunneled dialysis catheter on Tuesday, Thursday, and Saturday.  Patient was admitted on 12/06/2021 with severe sepsis.  Work-up to date has demonstrated MRSA bacteremia.  A echocardiogram demonstrated likely vegetation, concerning for endocarditis.  On my evaluation, the patient reports generalized malaise.  She has had shaking chills.  I explained the rationale behind removing the tunneled dialysis catheter.  She is understanding.  Past Medical History:  Diagnosis Date   Asthma    Chronic kidney disease    dialysis Tues Thurs Sat   Dysrhythmia    PAF 10/2019 in setting of COVID-19 PNA   Gout    Hypertension     Past Surgical History:  Procedure Laterality Date   CESAREAN SECTION  1989    Family History  Problem Relation Age of Onset   Hypertension Mother    Hypertension Father    Colon cancer Brother    Lung cancer Brother     Social History   Socioeconomic History   Marital status: Widowed    Spouse name: Not on file   Number of children: Not on file   Years of education: Not on file   Highest education level: Not on file  Occupational History   Not on file   Tobacco Use   Smoking status: Never   Smokeless tobacco: Never  Vaping Use   Vaping Use: Never used  Substance and Sexual Activity   Alcohol use: No   Drug use: No   Sexual activity: Not Currently    Birth control/protection: Post-menopausal  Other Topics Concern   Not on file  Social History Narrative   Not on file   Social Determinants of Health   Financial Resource Strain: Not on file  Food Insecurity: Not on file  Transportation Needs: Not on file  Physical Activity: Not on file  Stress: Not on file  Social Connections: Not on file  Intimate Partner Violence: Not on file    No Known Allergies  Current Facility-Administered Medications  Medication Dose Route Frequency Provider Last Rate Last Admin   acetaminophen (TYLENOL) tablet 650 mg  650 mg Oral Q6H PRN Howerter, Justin B, DO   650 mg at 12/19/21 0524   Or   acetaminophen (TYLENOL) suppository 650 mg  650 mg Rectal Q6H PRN Howerter, Justin B, DO       albuterol (PROVENTIL) (2.5 MG/3ML) 0.083% nebulizer solution 2.5 mg  2.5 mg Inhalation Q4H PRN Howerter, Justin B, DO       Chlorhexidine Gluconate Cloth 2 % PADS 6 each  6 each Topical Q0600 Roney Jaffe, MD   6 each at 12/19/21 0525   fentaNYL (SUBLIMAZE) injection 50 mcg  50 mcg Intravenous  Q2H PRN Howerter, Justin B, DO   50 mcg at 12/19/21 0132   heparin injection 5,000 Units  5,000 Units Subcutaneous Q8H Elgergawy, Silver Huguenin, MD   5,000 Units at 12/19/21 0524   lidocaine-EPINEPHrine (XYLOCAINE W/EPI) 1 %-1:100000 (with pres) injection 20 mL  20 mL Intradermal Once [name], MD       LORazepam (ATIVAN) injection 0.5 mg  0.5 mg Intravenous Once PRN Elgergawy, Silver Huguenin, MD       mupirocin ointment (BACTROBAN) 2 % 1 application  1 application Nasal BID Elgergawy, Silver Huguenin, MD       naloxone [facility]) injection 0.4 mg  0.4 mg Intravenous PRN Howerter, Justin B, DO       vancomycin (VANCOREADY) IVPB 750 mg/150 mL  750 mg Intravenous Q T,Th,Sa-HD Elgergawy,  Silver Huguenin, MD   750 mg at 12/19/21 0946   verapamil (CALAN) tablet 120 mg  120 mg Oral Q8H Howerter, Justin B, DO   120 mg at 12/19/21 0524    REVIEW OF SYSTEMS:  [X]  denotes positive finding, [ ]  denotes negative finding Cardiac  Comments:  Chest pain or chest pressure:    Shortness of breath upon exertion:    Short of breath when lying flat:    Irregular heart rhythm:        Vascular    Pain in calf, thigh, or hip brought on by ambulation:    Pain in feet at night that wakes you up from your sleep:     Blood clot in your veins:    Leg swelling:         Pulmonary    Oxygen at home:    Productive cough:     Wheezing:         Neurologic    Sudden weakness in arms or legs:     Sudden numbness in arms or legs:     Sudden onset of difficulty speaking or slurred speech:    Temporary loss of vision in one eye:     Problems with dizziness:         Gastrointestinal    Blood in stool:     Vomited blood:         Genitourinary    Burning when urinating:     Blood in urine:        Psychiatric    Major depression:         Hematologic    Bleeding problems:    Problems with blood clotting too easily:        Skin    Rashes or ulcers:        Constitutional    Fever or chills:      PHYSICAL EXAM Vitals:   12/19/21 1000 12/19/21 1030 12/19/21 1052 12/19/21 1148  BP: 93/61 116/81 133/76 132/83  Pulse: 80 83 96 83  Resp:  (!) 22 (!) 23 16  Temp:   99.5 F (37.5 C) 99.5 F (37.5 C)  TempSrc:   Oral Oral  SpO2:   96% 95%  Weight:      Height:        Constitutional: Chronically ill appearing. no distress. Appears well nourished.  Neurologic: CN intact. no focal findings. no sensory loss. Psychiatric:  Mood and affect symmetric and appropriate. Eyes:  No icterus. No conjunctival pallor. Ears, nose, throat:  mucous membranes moist. Midline trachea.  Cardiac: regular rate and rhythm.  Respiratory:  unlabored. Abdominal: non-distended.  Peripheral vascular: RIJ TDC in  place without  purulence. Extremity: no edema. no cyanosis. no pallor.  Skin: no gangrene. no ulceration.  Lymphatic: no Stemmer's sign. no palpable lymphadenopathy.  PERTINENT LABORATORY AND RADIOLOGIC DATA  Most recent CBC CBC Latest Ref Rng & Units 12/19/2021 12/17/2021 12/16/2021  WBC 4.0 - 10.5 K/uL 12.2(H) 20.3(H) 20.5(H)  Hemoglobin 12.0 - 15.0 g/dL 10.4(L) 11.3(L) 12.5  Hematocrit 36.0 - 46.0 % 30.3(L) 36.0 39.0  Platelets 150 - 400 K/uL 220 153 210     Most recent CMP CMP Latest Ref Rng & Units 12/19/2021 12/18/2021 12/17/2021  Glucose 70 - 99 mg/dL 101(H) 93 -  BUN 8 - 23 mg/dL 60(H) 40(H) -  Creatinine 0.44 - 1.00 mg/dL 6.98(H) 5.93(H) -  Sodium 135 - 145 mmol/L 132(L) 132(L) -  Potassium 3.5 - 5.1 mmol/L 3.4(L) 3.5 -  Chloride 98 - 111 mmol/L 93(L) 98 -  CO2 22 - 32 mmol/L 21(L) 21(L) -  Calcium 8.9 - 10.3 mg/dL 9.5 8.9 -  Total Protein 6.5 - 8.1 g/dL - - 6.7  Total Bilirubin 0.3 - 1.2 mg/dL - - 1.1  Alkaline Phos 38 - 126 U/L - - 80  AST 15 - 41 U/L - - 11(L)  ALT 0 - 44 U/L - - 8    Renal function Estimated Creatinine Clearance: 7.1 mL/min (A) (by C-G formula based on SCr of 6.98 mg/dL (H)).  Hgb A1c MFr Bld (%)  Date Value  10/18/2019 5.4    LDL Cholesterol  Date Value Ref Range Status  02/23/2011 80 0 - 99 mg/dL Final    Comment:    See lab report for associated comment(s)    Yevonne Aline. Stanford Breed, MD [facility] Phone Number: [phone] 12/19/2021 1:54 PM  Total time spent on preparing this encounter including chart review, data review, collecting history, examining the patient, coordinating care for this established patient, 40 minutes.  Portions of this report may have been transcribed using voice recognition software.  Every effort has been made to ensure accuracy; however, inadvertent computerized transcription errors may still be present.

## 2021-12-19 NOTE — Progress Notes (Signed)
Subjective: Seen on dialysis , having some chills  using IJ PermCath currently.  Again had discussion with her about severity of sepsis related to HD catheter and need to obtain more permanent access when  sepsis issue clears.   Also noted her may need to remove current HD cath for line holiday and replace later  Objective Vital signs in last 24 hours: Vitals:   12/19/21 0750 12/19/21 0755 12/19/21 0800 12/19/21 0830  BP: (!) 142/83 132/84 132/84 129/83  Pulse: 94 95 92 94  Resp: (!) 23     Temp: 98.1 F (36.7 C)     TempSrc: Temporal     SpO2: 96%     Weight: 66.7 kg     Height:       Weight change:   Physical Exam: General: Alert elderly female in NAD Heart: RRR, soft 1/6 SEM no rub or gallop Lungs: CTA nonlabored breathing Abdomen: NABS, soft NTND no ascites Extremities: No pedal edema Dialysis Access: Right IJ PermCath patent on HD   Home meds include - zyloprim, colchicine, verapamil 120 tid, ultram prn, albuterol prn hfa, prns/ vits/ supps      OP HD: TTS HD at [facility]  4h  400/600  68.5kg  3K/2 bath  Hep 2000  access ?   - hect 4 ug tiw  - venofer 50 q wk  - no esa, last Hb 11.9     Problem/Plan: Sepsis, MRSA bacteremia= possible source =PermCath ,ID seeing recommended vancomycin goal trough 15-20 follow-up blood cultures from 01/1 2 repeated blood cultures ordered 01/14  Noted yesterday TTE= MV vegetation, recommend TEE MRI LS = L5/S1 degenerative changes or or discitis/osteo- MRI TS= no evidence of discitis or osteomyelitis  Needs PermCath DC'd and line holiday till next HD follow-up labs/ will contact [facility]   ESRD -HD TTS on schedule currently on schedule HD today. Dc permcath 2nd sepsis , replace next wek  tues  am  , fu labs  HTN/volume -no excess volume, 1/12 = 2 L UF on HD needs wts  postdialysis verapamil continue mild hypertension today UF with dialysis tomorrow as able May need lower dry weight with weight loss sepsis Anemia -Hgb 10.4 <11.5 no ESA needs, DC  weekly Venofer with sepsis Secondary hyperparathyroidism -COrec calcium 1 11.1 phosphorus 5.9 <7.4 continue binders we will hold vitamin D with ^ Ca due to 2.0 calcium bath  Right-sided pulmonary nodule= follow-up intervention per primary/pulm Lower back pain tenderness in lower thoracic and lumbar spine= is better today, plan for admit team noted MRI LS =DJD versus osteomyelitis/discitis, MRI TS-minor DJD changes no discitis or osteo pain control per admit  Ernest Haber, PA-C [facility] Beeper [phone] 12/19/2021,8:43 AM  LOS: 2 days   Labs: Basic Metabolic Panel: Recent Labs  Lab 12/17/21 0554 12/18/21 0736 12/19/21 0630  NA 133* 132* 132*  K 5.4* 3.5 3.4*  CL 94* 98 93*  CO2 14* 21* 21*  GLUCOSE 82 93 101*  BUN 80* 40* 60*  CREATININE 9.58* 5.93* 6.98*  CALCIUM 8.9 8.9 9.5  PHOS 7.4* 5.8* 5.9*   Liver Function Tests: Recent Labs  Lab 12/17/21 0554 12/17/21 1251 12/17/21 1437 12/18/21 0736 12/19/21 0630  AST 16 18 11*  --   --   ALT 8 7 8   --   --   ALKPHOS 92 67 80  --   --   BILITOT 1.3* 1.6* 1.1  --   --   PROT 6.3* 5.5* 6.7  --   --  ALBUMIN 2.5* 2.4* 2.5* 2.2* 2.0*   No results for input(s): LIPASE, AMYLASE in the last 168 hours. No results for input(s): AMMONIA in the last 168 hours. CBC: Recent Labs  Lab 12/16/21 1450 12/17/21 0554 12/19/21 0644  WBC 20.5* 20.3* 12.2*  NEUTROABS 19.0* 18.3*  --   HGB 12.5 11.3* 10.4*  HCT 39.0 36.0 30.3*  MCV 97.5 100.8* 92.9  PLT 210 153 220   Cardiac Enzymes: No results for input(s): CKTOTAL, CKMB, CKMBINDEX, TROPONINI in the last 168 hours. CBG: No results for input(s): GLUCAP in the last 168 hours.  Studies/Results: MR THORACIC SPINE WO CONTRAST  Result Date: 12/18/2021 CLINICAL DATA:  Back pain with MRSA bacteremia EXAM: MRI THORACIC SPINE WITHOUT CONTRAST TECHNIQUE: Multiplanar, multisequence MR imaging of the thoracic spine was performed. No intravenous contrast was administered.  COMPARISON:  None. FINDINGS: Alignment: Accentuation of kyphosis. Anteroposterior alignment is maintained. Vertebrae: Mild degenerative endplate irregularity. Marrow signal is mildly heterogeneous. No substantial marrow edema. Cord:  No abnormal signal. Paraspinal and other soft tissues: Trace right pleural effusion. Disc levels: Minor degenerative changes are present with small disc bulges and protrusions and mild facet arthropathy. The spinal canal is widely patent. There is no significant foraminal narrowing. Partially imaged degenerative disc disease at lower cervical levels with canal stenosis. IMPRESSION: No evidence of discitis/osteomyelitis in the thoracic spine. Electronically Signed   By: Macy Mis M.D.   On: 12/18/2021 17:19   MR LUMBAR SPINE WO CONTRAST  Result Date: 12/18/2021 CLINICAL DATA:  Back pain with MRSA bacteremia EXAM: MRI LUMBAR SPINE WITHOUT CONTRAST TECHNIQUE: Multiplanar, multisequence MR imaging of the lumbar spine was performed. No intravenous contrast was administered. COMPARISON:  None. FINDINGS: Motion artifact is present. Segmentation: Transitional anatomy with sacralized L5 and remained tree L5-S1 disc. Alignment:  Grade 1 anterolisthesis at L3-L4. Vertebrae: Schmorl's node at the superior endplate of L2. Endplate irregularity and marrow edema and sclerosis at L5-S1. Otherwise heterogeneous marrow signal Conus medullaris and cauda equina: Conus extends to the L1 level. Conus and cauda equina appear normal. Paraspinal and other soft tissues: Right psoas edema at L5-S1. Disc levels: L1-L2:  Disc bulge.  Minor canal stenosis.  No foraminal stenosis. L2-L3:  No canal or foraminal stenosis. L3-L4: Anterolisthesis with uncovering of disc bulge. Left greater than right facet arthropathy with ligamentum flavum infolding. There is a left facet joint effusion. Moderate canal stenosis. Partial effacement subarticular recesses. Minor right and mild left foraminal stenosis. L4-L5: Disc  edema. Disc bulge with endplate osteophytic ridging. Facet arthropathy with ligamentum flavum infolding. Moderate to marked canal stenosis. Effacement of the right greater than left subarticular recesses. Mild right and mild to moderate left foraminal stenosis. There is some ill-defined signal in the ventral epidural space above and below disc level. L5-S1:  Transitional level with hypoplastic disc.  No stenosis. IMPRESSION: Endplate irregularity and marrow edema with disc edema at L5-S1. Mild right psoas edema. Some ill-defined signal in the ventral epidural space above and below this disc level. This may be degenerative in etiology or reflect discitis/osteomyelitis. Degenerative changes result in moderate to marked canal stenosis at L4-L5 with narrowing of subarticular recesses. Electronically Signed   By: Macy Mis M.D.   On: 12/18/2021 17:15   ECHOCARDIOGRAM COMPLETE  Result Date: 12/18/2021    ECHOCARDIOGRAM REPORT   Patient Name:   SERIN THORNELL Date of Exam: 12/18/2021 Medical Rec #:  [id number]      Height:       67.0 in Accession #:  [id number]     Weight:       152.0 lb Date of Birth:  1951-01-22     BSA:          1.800 m Patient Age:    71 years       BP:           138/83 mmHg Patient Gender: F              HR:           90 bpm. Exam Location:  Inpatient Procedure: 2D Echo Indications:    Bacteremia  History:        Patient has prior history of Echocardiogram examinations, most                 recent 11/06/2019. Risk Factors:Hypertension. Dysrhythmia.  Sonographer:    Arlyss Gandy Referring Phys: [place]  1. Left ventricular ejection fraction, by estimation, is 60 to 65%. The left ventricle has normal function. The left ventricle has no regional wall motion abnormalities. There is moderate hypertrophy of the basal septum. The rest of the LV segments demonstrate mild left ventricular hypertrophy. Left ventricular diastolic parameters are consistent with Grade I  diastolic dysfunction (impaired relaxation).  2. Right ventricular systolic function is normal. The right ventricular size is normal. There is normal pulmonary artery systolic pressure.  3. The mitral valve leaflets are thickened. There are fibrinous densities visualized on the anterior mitral valve leaflet on the apical 3C view. Given underlying bacteremia, findings concerning for vegetation formation (versus degenerative valve changes). There is trivial mitral regurgitation and no evidence of valve destruction. Recommend TEE for further evaluation.  4. The aortic valve is tricuspid. There is mild calcification of the aortic valve. There is moderate thickening of the aortic valve. Aortic valve regurgitation is trivial. Aortic valve sclerosis/calcification is present, without any evidence of aortic stenosis.  5. Aortic dilatation noted. There is mild dilatation of the ascending aorta, measuring 40 mm.  6. There is a highly mobile, fibrinous structure visualized in the RA most likely a eustachian valve or chiari network. Comparison(s): Compared to prior TTE in 11/2019, the mitral valve leaflets continue to be thickened, however, there is concern for possible vegetation on current study. Otherwise, the TR appears mild (previously mod-to-severe). Conclusion(s)/Recommendation(s): Findings concerning for mitral valve vegetation, would recommend a Transesophageal Echocardiogram for clarification. FINDINGS  Left Ventricle: Left ventricular ejection fraction, by estimation, is 60 to 65%. The left ventricle has normal function. The left ventricle has no regional wall motion abnormalities. The left ventricular internal cavity size was normal in size. There is  moderate hypertrophy of the basal septum. The rest of the LV segments demonstrate mild left ventricular hypertrophy. Left ventricular diastolic parameters are consistent with Grade I diastolic dysfunction (impaired relaxation). Right Ventricle: The right ventricular  size is normal. No increase in right ventricular wall thickness. Right ventricular systolic function is normal. There is normal pulmonary artery systolic pressure. The tricuspid regurgitant velocity is 2.84 m/s, and  with an assumed right atrial pressure of 3 mmHg, the estimated right ventricular systolic pressure is 60.7 mmHg. Left Atrium: Left atrial size was normal in size. Right Atrium: There is a highly mobile, fibrinous structure visualized in the RA most likely a eustachian valve or chiari network. Right atrial size was normal in size. Pericardium: There is no evidence of pericardial effusion. Mitral Valve: The mitral valve leaflets are thickened. There are fibrinous densities visualized on the anterior mitral valve  leaflet on the apical 3C view. Given underlying bacteremia, findings concerning for vegetation formation (versus degenerative valve changes). There is trivial mitral regurgitation and no evidence of valve destruction. Recommend TEE for further evaluation. The mitral valve is abnormal. There is moderate thickening of the mitral valve leaflet(s). Mild mitral annular calcification. Trivial mitral valve regurgitation. Tricuspid Valve: The tricuspid valve is normal in structure. Tricuspid valve regurgitation is mild. Aortic Valve: The aortic valve is tricuspid. There is mild calcification of the aortic valve. There is moderate thickening of the aortic valve. Aortic valve regurgitation is trivial. Aortic valve sclerosis/calcification is present, without any evidence of aortic stenosis. Aortic valve mean gradient measures 7.0 mmHg. Aortic valve peak gradient measures 12.7 mmHg. Aortic valve area, by VTI measures 3.19 cm. Pulmonic Valve: The pulmonic valve was normal in structure. Pulmonic valve regurgitation is trivial. Aorta: Aortic dilatation noted. There is mild dilatation of the ascending aorta, measuring 40 mm. IAS/Shunts: No atrial level shunt detected by color flow Doppler.  LEFT VENTRICLE PLAX  2D LVIDd:         4.00 cm   Diastology LVIDs:         2.80 cm   LV e' medial:    8.10 cm/s LV PW:         1.30 cm   LV E/e' medial:  11.3 LV IVS:        1.30 cm   LV e' lateral:   11.80 cm/s LVOT diam:     2.20 cm   LV E/e' lateral: 7.7 LV SV:         98 LV SV Index:   54 LVOT Area:     3.80 cm  RIGHT VENTRICLE             IVC RV Basal diam:  3.60 cm     IVC diam: 2.00 cm RV S prime:     16.70 cm/s TAPSE (M-mode): 2.6 cm LEFT ATRIUM             Index        RIGHT ATRIUM           Index LA Vol (A2C):   60.2 ml 33.45 ml/m  RA Area:     14.20 cm LA Vol (A4C):   47.4 ml 26.34 ml/m  RA Volume:   33.30 ml  18.50 ml/m LA Biplane Vol: 54.4 ml 30.23 ml/m  AORTIC VALVE AV Area (Vmax):    2.99 cm AV Area (Vmean):   3.13 cm AV Area (VTI):     3.19 cm AV Vmax:           178.00 cm/s AV Vmean:          117.000 cm/s AV VTI:            0.307 m AV Peak Grad:      12.7 mmHg AV Mean Grad:      7.0 mmHg LVOT Vmax:         140.00 cm/s LVOT Vmean:        96.200 cm/s LVOT VTI:          0.258 m LVOT/AV VTI ratio: 0.84  AORTA Ao Root diam: 3.20 cm Ao Asc diam:  4.00 cm MITRAL VALVE               TRICUSPID VALVE MV Area (PHT): 4.41 cm    TR Peak grad:   32.3 mmHg MV Decel Time: 172 msec    TR Vmax:  284.00 cm/s MV E velocity: 91.20 cm/s MV A velocity: 99.60 cm/s  SHUNTS MV E/A ratio:  0.92        Systemic VTI:  0.26 m                            Systemic Diam: 2.20 cm Gwyndolyn Kaufman MD Electronically signed by Gwyndolyn Kaufman MD Signature Date/Time: 12/18/2021/4:24:40 PM    Final    Medications:  ferric gluconate (FERRLECIT) IVPB     vancomycin      Chlorhexidine Gluconate Cloth  6 each Topical Q0600   doxercalciferol  4 mcg Intravenous Q T,Th,Sa-HD   heparin injection (subcutaneous)  5,000 Units Subcutaneous Q8H   mupirocin ointment  1 application Nasal BID   verapamil  120 mg Oral Q8H

## 2021-12-19 NOTE — Progress Notes (Signed)
PROGRESS NOTE    Diamond [name]  TJQ:[id number] DOB: 06/19/1951 DOA: 12/16/2021 PCP: [name], MD   Chief Complaint  Patient presents with   Back Pain    Brief Narrative:   Diamond [name] is a 71 y.o. female with medical history significant for incisional disease on hemodialysis on Tuesday, Thursday, Saturday schedule, mild intermittent asthma, hypertension, anemia of chronic kidney disease, who is admitted to [facility] on 12/16/2021 with severe sepsis after presenting from home to [facility] ED complaining of right-sided back pain.  Her work-up was significant for sepsis, blood pressure came back growing MRSA.      Assessment & Plan:   Principal Problem:   Severe sepsis ([place]) Active Problems:   Hypertension   Anemia of chronic disease   Leukocytosis   Lactic acidosis   Low back pain   ESRD (end stage renal disease) (HCC)   Mild intermittent asthma   Sepsis, present on admission, due to bacteremia/discitis/possible endocarditis -Present on admission, leukocytosis 20 K, tachycardic 109, tachypneic 26, with elevated lactic acid of 2.7. -Blood culture showing MRSA , currently on IV vancomycin -As well possible urinary source as evidence of cystitis on imaging. -2D echo significant for possible mitral valve vegetation, will need TEE, plan for Tuesday -Lumbar/thoracic spine MRI significant for discitis/osteomyelitis will need prolonged IV antibiotic therapy -Will need line holiday, vascular surgery consulted, they will DC tunneled cath today likely plan for fistula early next week.. -ID input greatly appreciated.   End-stage renal disease:  - On hemodialysis on Tuesday, Thursday, Saturday schedule.  Renal consulted     Mild intermittent asthma:  -  Continue home prn albuterol inhaler.  Add on serum magnesium level.   Anemia of chronic kidney disease:  -Procrit and IV iron per renal      Hypertension:  -Continue with verapamil, holding hydrochlorothiazide,  will add as needed hydralazine.         DVT prophylaxis: Heparin Code Status: Full Family Communication: none at bedside, I have discussed with patient at length and answered her questions. Disposition:   Status is: Inpatient  The patient will require care spanning > 2 midnights and should be moved to inpatient because: Now she is with a gram-positive cocci bacteremia, in need of IV antibiotic and further work-up.      Consultants:  Renal ID  vascular   Subjective:  Patient reports she is feeling better, denies any fever or chills, no chest pain, reports back pain still present but improved.  Objective: Vitals:   12/19/21 1000 12/19/21 1030 12/19/21 1052 12/19/21 1148  BP: 93/61 116/81 133/76 132/83  Pulse: 80 83 96 83  Resp:  (!) 22 (!) 23 16  Temp:   99.5 F (37.5 C) 99.5 F (37.5 C)  TempSrc:   Oral Oral  SpO2:   96% 95%  Weight:      Height:        Intake/Output Summary (Last 24 hours) at 12/19/2021 1404 Last data filed at 12/19/2021 1052 Gross per 24 hour  Intake 240 ml  Output 1950 ml  Net -1710 ml   Filed Weights   12/16/21 1437 12/19/21 0750  Weight: 68.9 kg 66.7 kg    Examination:  Awake Alert, Oriented X 3, No new F.N deficits, Normal affect Symmetrical Chest wall movement, Good air movement bilaterally, CTAB RRR,No Gallops,Rubs or new Murmurs, No Parasternal Heave +ve B.Sounds, Abd Soft, No tenderness, No rebound - guarding or rigidity. No Cyanosis, Clubbing or edema, No new Rash  or bruise         Data Reviewed: I have personally reviewed following labs and imaging studies  CBC: Recent Labs  Lab 12/16/21 1450 12/17/21 0554 12/19/21 0644  WBC 20.5* 20.3* 12.2*  NEUTROABS 19.0* 18.3*  --   HGB 12.5 11.3* 10.4*  HCT 39.0 36.0 30.3*  MCV 97.5 100.8* 92.9  PLT 210 153 456    Basic Metabolic Panel: Recent Labs  Lab 12/16/21 1450 12/17/21 0554 12/18/21 0736 12/19/21 0630  NA 130* 133* 132* 132*  K 4.5 5.4* 3.5 3.4*  CL 97*  94* 98 93*  CO2 15* 14* 21* 21*  GLUCOSE 102* 82 93 101*  BUN 73* 80* 40* 60*  CREATININE 9.73* 9.58* 5.93* 6.98*  CALCIUM 9.9 8.9 8.9 9.5  MG  --  1.9  --   --   PHOS  --  7.4* 5.8* 5.9*    GFR: Estimated Creatinine Clearance: 7.1 mL/min (A) (by C-G formula based on SCr of 6.98 mg/dL (H)).  Liver Function Tests: Recent Labs  Lab 12/16/21 1450 12/17/21 0554 12/17/21 1251 12/17/21 1437 12/18/21 0736 12/19/21 0630  AST 15 16 18  11*  --   --   ALT 10 8 7 8   --   --   ALKPHOS 71 92 67 80  --   --   BILITOT 1.1 1.3* 1.6* 1.1  --   --   PROT 7.2 6.3* 5.5* 6.7  --   --   ALBUMIN 3.0* 2.5* 2.4* 2.5* 2.2* 2.0*    CBG: No results for input(s): GLUCAP in the last 168 hours.   Recent Results (from the past 240 hour(s))  Resp Panel by RT-PCR (Flu A&B, Covid) Nasopharyngeal Swab     Status: None   Collection Time: 12/16/21  6:02 PM   Specimen: Nasopharyngeal Swab; Nasopharyngeal(NP) swabs in vial transport medium  Result Value Ref Range Status   SARS Coronavirus 2 by RT PCR NEGATIVE NEGATIVE Final    Comment: (NOTE) SARS-CoV-2 target nucleic acids are NOT DETECTED.  The SARS-CoV-2 RNA is generally detectable in upper respiratory specimens during the acute phase of infection. The lowest concentration of SARS-CoV-2 viral copies this assay can detect is 138 copies/mL. A negative result does not preclude SARS-Cov-2 infection and should not be used as the sole basis for treatment or other patient management decisions. A negative result may occur with  improper specimen collection/handling, submission of specimen other than nasopharyngeal swab, presence of viral mutation(s) within the areas targeted by this assay, and inadequate number of viral copies(<138 copies/mL). A negative result must be combined with clinical observations, patient history, and epidemiological information. The expected result is Negative.  Fact Sheet for Patients:   EntrepreneurPulse.com.au  Fact Sheet for Healthcare Providers:  IncredibleEmployment.be  This test is no t yet approved or cleared by the [place] FDA and  has been authorized for detection and/or diagnosis of SARS-CoV-2 by FDA under an Emergency Use Authorization (EUA). This EUA will remain  in effect (meaning this test can be used) for the duration of the COVID-19 declaration under Section 564(b)(1) of the Act, 21 U.S.C.section 360bbb-3(b)(1), unless the authorization is terminated  or revoked sooner.       Influenza A by PCR NEGATIVE NEGATIVE Final   Influenza B by PCR NEGATIVE NEGATIVE Final    Comment: (NOTE) The Xpert Xpress SARS-CoV-2/FLU/RSV plus assay is intended as an aid in the diagnosis of influenza from Nasopharyngeal swab specimens and should not be used as a sole basis  for treatment. Nasal washings and aspirates are unacceptable for Xpert Xpress SARS-CoV-2/FLU/RSV testing.  Fact Sheet for Patients: EntrepreneurPulse.com.au  Fact Sheet for Healthcare Providers: IncredibleEmployment.be  This test is not yet approved or cleared by the [place] FDA and has been authorized for detection and/or diagnosis of SARS-CoV-2 by FDA under an Emergency Use Authorization (EUA). This EUA will remain in effect (meaning this test can be used) for the duration of the COVID-19 declaration under Section 564(b)(1) of the Act, 21 U.S.C. section 360bbb-3(b)(1), unless the authorization is terminated or revoked.  Performed at [facility], [place] [address] [place], [place] [address]   Urine Culture     Status: None   Collection Time: 12/16/21 11:49 PM   Specimen: Urine, Clean Catch  Result Value Ref Range Status   Specimen Description URINE, CLEAN CATCH  Final   Special Requests NONE  Final   Culture   Final    NO GROWTH Performed at [facility], [place] [address] [place], [place]  [address]    Report Status 12/17/2021 FINAL  Final  Culture, blood (routine x 2)     Status: Abnormal   Collection Time: 12/17/21  1:14 AM   Specimen: BLOOD LEFT HAND  Result Value Ref Range Status   Specimen Description BLOOD LEFT HAND  Final   Special Requests   Final    BOTTLES DRAWN AEROBIC AND ANAEROBIC Blood Culture adequate volume   Culture  Setup Time   Final    GRAM POSITIVE COCCI IN CLUSTERS IN BOTH AEROBIC AND ANAEROBIC BOTTLES CRITICAL VALUE NOTED.  VALUE IS CONSISTENT WITH PREVIOUSLY REPORTED AND CALLED VALUE.    Culture (A)  Final    STAPHYLOCOCCUS AUREUS SUSCEPTIBILITIES PERFORMED ON PREVIOUS CULTURE WITHIN THE LAST 5 DAYS. Performed at [facility], [place] [address], [place], [place] [address]    Report Status 12/19/2021 FINAL  Final  Culture, blood (routine x 2)     Status: Abnormal   Collection Time: 12/17/21  1:19 AM   Specimen: BLOOD  Result Value Ref Range Status   Specimen Description BLOOD SITE NOT SPECIFIED  Final   Special Requests   Final    BOTTLES DRAWN AEROBIC AND ANAEROBIC Blood Culture results may not be optimal due to an inadequate volume of blood received in culture bottles   Culture  Setup Time   Final    GRAM POSITIVE COCCI IN CLUSTERS IN BOTH AEROBIC AND ANAEROBIC BOTTLES CRITICAL RESULT CALLED TO, READ BACK BY AND VERIFIED WITHLoletha Grayer AMEND PHARMD [id number] 12/17/21 A BROWNING Performed at [facility], [place] [address] [place], [place] [address]    Culture METHICILLIN RESISTANT STAPHYLOCOCCUS AUREUS (A)  Final   Report Status 12/19/2021 FINAL  Final   Organism ID, Bacteria METHICILLIN RESISTANT STAPHYLOCOCCUS AUREUS  Final      Susceptibility   Methicillin resistant staphylococcus aureus - MIC*    CIPROFLOXACIN <=0.5 SENSITIVE Sensitive     ERYTHROMYCIN <=0.25 SENSITIVE Sensitive     GENTAMICIN <=0.5 SENSITIVE Sensitive     OXACILLIN RESISTANT Resistant     TETRACYCLINE >=16 RESISTANT Resistant     VANCOMYCIN <=0.5 SENSITIVE Sensitive      TRIMETH/SULFA <=10 SENSITIVE Sensitive     CLINDAMYCIN <=0.25 SENSITIVE Sensitive     RIFAMPIN <=0.5 SENSITIVE Sensitive     Inducible Clindamycin NEGATIVE Sensitive     * METHICILLIN RESISTANT STAPHYLOCOCCUS AUREUS  Blood Culture ID Panel (Reflexed)     Status: Abnormal   Collection  Time: 12/17/21  1:19 AM  Result Value Ref Range Status   Enterococcus faecalis NOT DETECTED NOT DETECTED Final   Enterococcus Faecium NOT DETECTED NOT DETECTED Final   Listeria monocytogenes NOT DETECTED NOT DETECTED Final   Staphylococcus species DETECTED (A) NOT DETECTED Final    Comment: CRITICAL RESULT CALLED TO, READ BACK BY AND VERIFIED WITH: C AMEND PHARMD 1613 12/17/21 A BROWNING    Staphylococcus aureus (BCID) DETECTED (A) NOT DETECTED Final    Comment: Methicillin (oxacillin)-resistant Staphylococcus aureus (MRSA). MRSA is predictably resistant to beta-lactam antibiotics (except ceftaroline). Preferred therapy is vancomycin unless clinically contraindicated. Patient requires contact precautions if  hospitalized. CRITICAL RESULT CALLED TO, READ BACK BY AND VERIFIED WITH: C AMEND PHARMD 1308 12/17/21 A BROWNING    Staphylococcus epidermidis NOT DETECTED NOT DETECTED Final   Staphylococcus lugdunensis NOT DETECTED NOT DETECTED Final   Streptococcus species NOT DETECTED NOT DETECTED Final   Streptococcus agalactiae NOT DETECTED NOT DETECTED Final   Streptococcus pneumoniae NOT DETECTED NOT DETECTED Final   Streptococcus pyogenes NOT DETECTED NOT DETECTED Final   A.calcoaceticus-baumannii NOT DETECTED NOT DETECTED Final   Bacteroides fragilis NOT DETECTED NOT DETECTED Final   Enterobacterales NOT DETECTED NOT DETECTED Final   Enterobacter cloacae complex NOT DETECTED NOT DETECTED Final   Escherichia coli NOT DETECTED NOT DETECTED Final   Klebsiella aerogenes NOT DETECTED NOT DETECTED Final   Klebsiella oxytoca NOT DETECTED NOT DETECTED Final   Klebsiella pneumoniae NOT DETECTED NOT DETECTED Final    Proteus species NOT DETECTED NOT DETECTED Final   Salmonella species NOT DETECTED NOT DETECTED Final   Serratia marcescens NOT DETECTED NOT DETECTED Final   Haemophilus influenzae NOT DETECTED NOT DETECTED Final   Neisseria meningitidis NOT DETECTED NOT DETECTED Final   Pseudomonas aeruginosa NOT DETECTED NOT DETECTED Final   Stenotrophomonas maltophilia NOT DETECTED NOT DETECTED Final   Candida albicans NOT DETECTED NOT DETECTED Final   Candida auris NOT DETECTED NOT DETECTED Final   Candida glabrata NOT DETECTED NOT DETECTED Final   Candida krusei NOT DETECTED NOT DETECTED Final   Candida parapsilosis NOT DETECTED NOT DETECTED Final   Candida tropicalis NOT DETECTED NOT DETECTED Final   Cryptococcus neoformans/gattii NOT DETECTED NOT DETECTED Final   Meth resistant mecA/C and MREJ DETECTED (A) NOT DETECTED Final    Comment: CRITICAL RESULT CALLED TO, READ BACK BY AND VERIFIED WITHLoletha Grayer AMEND PHARMD 1613 12/17/21 A BROWNING Performed at [facility], [address], [place], [place] [address]   MRSA Next Gen by PCR, Nasal     Status: Abnormal   Collection Time: 12/19/21 12:40 AM   Specimen: Nasal Mucosa; Nasal Swab  Result Value Ref Range Status   MRSA by PCR Next Gen DETECTED (A) NOT DETECTED Final    Comment: RESULT CALLED TO, READ BACK BY AND VERIFIED WITH: RN M.ENDISON 12/19/21@5 :45 BY TW (NOTE) The GeneXpert MRSA Assay (FDA approved for NASAL specimens only), is one component of a comprehensive MRSA colonization surveillance program. It is not intended to diagnose MRSA infection nor to guide or monitor treatment for MRSA infections. Test performance is not FDA approved in patients less than 65 years old. Performed at [facility], [place] [address] [place], [place] [address]          Radiology Studies: MR THORACIC SPINE WO CONTRAST  Result Date: 12/18/2021 CLINICAL DATA:  Back pain with MRSA bacteremia EXAM: MRI THORACIC SPINE WITHOUT CONTRAST TECHNIQUE:  Multiplanar, multisequence MR imaging of the thoracic spine was  performed. No intravenous contrast was administered. COMPARISON:  None. FINDINGS: Alignment: Accentuation of kyphosis. Anteroposterior alignment is maintained. Vertebrae: Mild degenerative endplate irregularity. Marrow signal is mildly heterogeneous. No substantial marrow edema. Cord:  No abnormal signal. Paraspinal and other soft tissues: Trace right pleural effusion. Disc levels: Minor degenerative changes are present with small disc bulges and protrusions and mild facet arthropathy. The spinal canal is widely patent. There is no significant foraminal narrowing. Partially imaged degenerative disc disease at lower cervical levels with canal stenosis. IMPRESSION: No evidence of discitis/osteomyelitis in the thoracic spine. Electronically Signed   By: Macy Mis M.D.   On: 12/18/2021 17:19   MR LUMBAR SPINE WO CONTRAST  Result Date: 12/18/2021 CLINICAL DATA:  Back pain with MRSA bacteremia EXAM: MRI LUMBAR SPINE WITHOUT CONTRAST TECHNIQUE: Multiplanar, multisequence MR imaging of the lumbar spine was performed. No intravenous contrast was administered. COMPARISON:  None. FINDINGS: Motion artifact is present. Segmentation: Transitional anatomy with sacralized L5 and remained tree L5-S1 disc. Alignment:  Grade 1 anterolisthesis at L3-L4. Vertebrae: Schmorl's node at the superior endplate of L2. Endplate irregularity and marrow edema and sclerosis at L5-S1. Otherwise heterogeneous marrow signal Conus medullaris and cauda equina: Conus extends to the L1 level. Conus and cauda equina appear normal. Paraspinal and other soft tissues: Right psoas edema at L5-S1. Disc levels: L1-L2:  Disc bulge.  Minor canal stenosis.  No foraminal stenosis. L2-L3:  No canal or foraminal stenosis. L3-L4: Anterolisthesis with uncovering of disc bulge. Left greater than right facet arthropathy with ligamentum flavum infolding. There is a left facet joint effusion. Moderate  canal stenosis. Partial effacement subarticular recesses. Minor right and mild left foraminal stenosis. L4-L5: Disc edema. Disc bulge with endplate osteophytic ridging. Facet arthropathy with ligamentum flavum infolding. Moderate to marked canal stenosis. Effacement of the right greater than left subarticular recesses. Mild right and mild to moderate left foraminal stenosis. There is some ill-defined signal in the ventral epidural space above and below disc level. L5-S1:  Transitional level with hypoplastic disc.  No stenosis. IMPRESSION: Endplate irregularity and marrow edema with disc edema at L5-S1. Mild right psoas edema. Some ill-defined signal in the ventral epidural space above and below this disc level. This may be degenerative in etiology or reflect discitis/osteomyelitis. Degenerative changes result in moderate to marked canal stenosis at L4-L5 with narrowing of subarticular recesses. Electronically Signed   By: Macy Mis M.D.   On: 12/18/2021 17:15   ECHOCARDIOGRAM COMPLETE  Result Date: 12/18/2021    ECHOCARDIOGRAM REPORT   Patient Name:   Diamond [name] Date of Exam: 12/18/2021 Medical Rec #:  [id number]      Height:       67.0 in Accession #:    [id number]     Weight:       152.0 lb Date of Birth:  1951-03-21     BSA:          1.800 m Patient Age:    1 years       BP:           138/83 mmHg Patient Gender: F              HR:           90 bpm. Exam Location:  Inpatient Procedure: 2D Echo Indications:    Bacteremia  History:        Patient has prior history of Echocardiogram examinations, most  recent 11/06/2019. Risk Factors:Hypertension. Dysrhythmia.  Sonographer:    Arlyss Gandy Referring Phys: [place]  1. Left ventricular ejection fraction, by estimation, is 60 to 65%. The left ventricle has normal function. The left ventricle has no regional wall motion abnormalities. There is moderate hypertrophy of the basal septum. The rest of the LV segments  demonstrate mild left ventricular hypertrophy. Left ventricular diastolic parameters are consistent with Grade I diastolic dysfunction (impaired relaxation).  2. Right ventricular systolic function is normal. The right ventricular size is normal. There is normal pulmonary artery systolic pressure.  3. The mitral valve leaflets are thickened. There are fibrinous densities visualized on the anterior mitral valve leaflet on the apical 3C view. Given underlying bacteremia, findings concerning for vegetation formation (versus degenerative valve changes). There is trivial mitral regurgitation and no evidence of valve destruction. Recommend TEE for further evaluation.  4. The aortic valve is tricuspid. There is mild calcification of the aortic valve. There is moderate thickening of the aortic valve. Aortic valve regurgitation is trivial. Aortic valve sclerosis/calcification is present, without any evidence of aortic stenosis.  5. Aortic dilatation noted. There is mild dilatation of the ascending aorta, measuring 40 mm.  6. There is a highly mobile, fibrinous structure visualized in the RA most likely a eustachian valve or chiari network. Comparison(s): Compared to prior TTE in 11/2019, the mitral valve leaflets continue to be thickened, however, there is concern for possible vegetation on current study. Otherwise, the TR appears mild (previously mod-to-severe). Conclusion(s)/Recommendation(s): Findings concerning for mitral valve vegetation, would recommend a Transesophageal Echocardiogram for clarification. FINDINGS  Left Ventricle: Left ventricular ejection fraction, by estimation, is 60 to 65%. The left ventricle has normal function. The left ventricle has no regional wall motion abnormalities. The left ventricular internal cavity size was normal in size. There is  moderate hypertrophy of the basal septum. The rest of the LV segments demonstrate mild left ventricular hypertrophy. Left ventricular diastolic parameters  are consistent with Grade I diastolic dysfunction (impaired relaxation). Right Ventricle: The right ventricular size is normal. No increase in right ventricular wall thickness. Right ventricular systolic function is normal. There is normal pulmonary artery systolic pressure. The tricuspid regurgitant velocity is 2.84 m/s, and  with an assumed right atrial pressure of 3 mmHg, the estimated right ventricular systolic pressure is 05.3 mmHg. Left Atrium: Left atrial size was normal in size. Right Atrium: There is a highly mobile, fibrinous structure visualized in the RA most likely a eustachian valve or chiari network. Right atrial size was normal in size. Pericardium: There is no evidence of pericardial effusion. Mitral Valve: The mitral valve leaflets are thickened. There are fibrinous densities visualized on the anterior mitral valve leaflet on the apical 3C view. Given underlying bacteremia, findings concerning for vegetation formation (versus degenerative valve changes). There is trivial mitral regurgitation and no evidence of valve destruction. Recommend TEE for further evaluation. The mitral valve is abnormal. There is moderate thickening of the mitral valve leaflet(s). Mild mitral annular calcification. Trivial mitral valve regurgitation. Tricuspid Valve: The tricuspid valve is normal in structure. Tricuspid valve regurgitation is mild. Aortic Valve: The aortic valve is tricuspid. There is mild calcification of the aortic valve. There is moderate thickening of the aortic valve. Aortic valve regurgitation is trivial. Aortic valve sclerosis/calcification is present, without any evidence of aortic stenosis. Aortic valve mean gradient measures 7.0 mmHg. Aortic valve peak gradient measures 12.7 mmHg. Aortic valve area, by VTI measures 3.19 cm. Pulmonic Valve: The pulmonic  valve was normal in structure. Pulmonic valve regurgitation is trivial. Aorta: Aortic dilatation noted. There is mild dilatation of the ascending  aorta, measuring 40 mm. IAS/Shunts: No atrial level shunt detected by color flow Doppler.  LEFT VENTRICLE PLAX 2D LVIDd:         4.00 cm   Diastology LVIDs:         2.80 cm   LV e' medial:    8.10 cm/s LV PW:         1.30 cm   LV E/e' medial:  11.3 LV IVS:        1.30 cm   LV e' lateral:   11.80 cm/s LVOT diam:     2.20 cm   LV E/e' lateral: 7.7 LV SV:         98 LV SV Index:   54 LVOT Area:     3.80 cm  RIGHT VENTRICLE             IVC RV Basal diam:  3.60 cm     IVC diam: 2.00 cm RV S prime:     16.70 cm/s TAPSE (M-mode): 2.6 cm LEFT ATRIUM             Index        RIGHT ATRIUM           Index LA Vol (A2C):   60.2 ml 33.45 ml/m  RA Area:     14.20 cm LA Vol (A4C):   47.4 ml 26.34 ml/m  RA Volume:   33.30 ml  18.50 ml/m LA Biplane Vol: 54.4 ml 30.23 ml/m  AORTIC VALVE AV Area (Vmax):    2.99 cm AV Area (Vmean):   3.13 cm AV Area (VTI):     3.19 cm AV Vmax:           178.00 cm/s AV Vmean:          117.000 cm/s AV VTI:            0.307 m AV Peak Grad:      12.7 mmHg AV Mean Grad:      7.0 mmHg LVOT Vmax:         140.00 cm/s LVOT Vmean:        96.200 cm/s LVOT VTI:          0.258 m LVOT/AV VTI ratio: 0.84  AORTA Ao Root diam: 3.20 cm Ao Asc diam:  4.00 cm MITRAL VALVE               TRICUSPID VALVE MV Area (PHT): 4.41 cm    TR Peak grad:   32.3 mmHg MV Decel Time: 172 msec    TR Vmax:        284.00 cm/s MV E velocity: 91.20 cm/s MV A velocity: 99.60 cm/s  SHUNTS MV E/A ratio:  0.92        Systemic VTI:  0.26 m                            Systemic Diam: 2.20 cm Gwyndolyn Kaufman MD Electronically signed by Gwyndolyn Kaufman MD Signature Date/Time: 12/18/2021/4:24:40 PM    Final         Scheduled Meds:  Chlorhexidine Gluconate Cloth  6 each Topical Q0600   heparin injection (subcutaneous)  5,000 Units Subcutaneous Q8H   lidocaine-EPINEPHrine  20 mL Intradermal Once   mupirocin ointment  1 application Nasal BID   verapamil  120 mg Oral  Q8H   Continuous Infusions:  vancomycin       LOS: 2 days        Phillips Climes, MD [facility]   To contact the attending provider between 7A-7P or the covering provider during after hours 7P-7A, please log into the web site [email or web address] and access using universal [place] password for that web site. If you do not have the password, please call the hospital operator.  12/19/2021, 2:04 PM   Patient ID: Diamond [name], female   DOB: 1951/11/03, 71 y.o.   MRN: [id number] Patient ID: Diamond [name], female   DOB: 16-Mar-1951, 71 y.o.   MRN: [id number] Patient ID: Diamond [name], female   DOB: 02-26-1951, 71 y.o.   MRN: [id number]

## 2021-12-20 DIAGNOSIS — N186 End stage renal disease: Secondary | ICD-10-CM | POA: Diagnosis not present

## 2021-12-20 DIAGNOSIS — A419 Sepsis, unspecified organism: Secondary | ICD-10-CM | POA: Diagnosis not present

## 2021-12-20 DIAGNOSIS — R6520 Severe sepsis without septic shock: Secondary | ICD-10-CM | POA: Diagnosis not present

## 2021-12-20 MED ORDER — OXYCODONE HCL 5 MG PO TABS
5.0000 mg | ORAL_TABLET | ORAL | Status: DC | PRN
  Administered 2021-12-20 – 2021-12-29 (×12): 5 mg via ORAL
  Filled 2021-12-20 (×13): qty 1

## 2021-12-20 MED ORDER — POLYETHYLENE GLYCOL 3350 17 G PO PACK
34.0000 g | PACK | Freq: Every day | ORAL | Status: DC
  Administered 2021-12-20 – 2021-12-21 (×2): 34 g via ORAL
  Filled 2021-12-20 (×2): qty 2

## 2021-12-20 MED ORDER — SENNOSIDES-DOCUSATE SODIUM 8.6-50 MG PO TABS
2.0000 | ORAL_TABLET | Freq: Two times a day (BID) | ORAL | Status: DC
  Administered 2021-12-20 – 2021-12-27 (×10): 2 via ORAL
  Filled 2021-12-20 (×16): qty 2

## 2021-12-20 MED ORDER — POLYETHYLENE GLYCOL 3350 17 G PO PACK: 17.0000 g | PACK | Freq: Every day | ORAL | Status: DC

## 2021-12-20 NOTE — Progress Notes (Signed)
[facility]  Date of Admission:  12/16/2021   Total days of inpatient antibiotics 5  Principal Problem:   Severe sepsis (HCC) Active Problems:   Hypertension   Anemia of chronic disease   Leukocytosis   Lactic acidosis   Low back pain   ESRD (end stage renal disease) (HCC)   Mild intermittent asthma          Assessment: 70 YF with ESRD on HD via right IJ tunneled catheter admitted for sepsis 2/2 suspect UTI. Urine Cx showed no growth. Found to have MRSA bacteremia.  #MRSA bacteremia #L5-S1 osteomyelitis #ESRD on HD via RIJ SP removal on 1/14 -1/11 blood Cx+ MRSA, 1/13 blood Cx NGTD -TTE did not show valvular vegetation -MRI showed concern for L5-S1 discitis/osteomyelitis -Pt had RIJ TDC removed on 1/14 and plan for line holiday till 1/17 -Source of bacteremia unclear but strong suspicion for endocarditis given MRSA bacteremia in an hemodialysis pt via IJ and vertebral osteomyelitis(likely septic emboli). Recommendations: -Continue Vancomycin. Anticipate 8 weeks antibiotics for vertebral infection. -TEE(scheduled for 12/22/21) -Follow repeat blood Cx to ensure clearance  #Rt sided pulmonary nodule on CT  in 2011 -Follow-up imaging per primary/pulm Microbiology:   Antibiotics: Vancomycin 1/11-p Cefepime 1/11- Metronidazole 1/11- Cultures: Blood 1/11 blood Cx+ MRSA, 1/13 blood Cx NGTD 1/11 Urine Cx no growth.   SUBJECTIVE: Pt resting in bed. Daughter and grand daughter are at bedside.  Pt reports her back pain started about a week and a half ago.   Review of Systems: Review of Systems  All other systems reviewed and are negative.   Scheduled Meds:  Chlorhexidine Gluconate Cloth  6 each Topical Q0600   heparin injection (subcutaneous)  5,000 Units Subcutaneous Q8H   mupirocin ointment  1 application Nasal BID   polyethylene glycol  34 g Oral Daily   senna-docusate  2 tablet Oral BID   verapamil  120 mg Oral Q8H    Continuous Infusions:  vancomycin     PRN Meds:.acetaminophen **OR** acetaminophen, albuterol, naLOXone (NARCAN)  injection, oxyCODONE No Known Allergies  OBJECTIVE: Vitals:   12/20/21 0517 12/20/21 0749 12/20/21 1157 12/20/21 1325  BP: 128/67 125/78 134/83 (!) 140/93  Pulse:  79 87   Resp:  19 12   Temp:  98.6 F (37 C) 98.6 F (37 C)   TempSrc:  Axillary Axillary   SpO2:  95% 97%   Weight:      Height:       Body mass index is 22.27 kg/m.  Physical Exam Constitutional:      Appearance: Normal appearance.  HENT:     Head: Normocephalic and atraumatic.     Right Ear: Tympanic membrane normal.     Left Ear: Tympanic membrane normal.     Nose: Nose normal.     Mouth/Throat:     Mouth: Mucous membranes are moist.  Eyes:     Extraocular Movements: Extraocular movements intact.     Conjunctiva/sclera: Conjunctivae normal.     Pupils: Pupils are equal, round, and reactive to light.  Cardiovascular:     Rate and Rhythm: Normal rate and regular rhythm.     Heart sounds: No murmur heard.   No friction rub. No gallop.  Pulmonary:     Effort: Pulmonary effort is normal.     Breath sounds: Normal breath sounds.  Abdominal:     General: Abdomen is flat.     Palpations: Abdomen is soft.  Musculoskeletal:  General: Normal range of motion.  Skin:    General: Skin is warm and dry.  Neurological:     General: No focal deficit present.     Mental Status: She is alert and oriented to person, place, and time.  Psychiatric:        Mood and Affect: Mood normal.      Lab Results Lab Results  Component Value Date   WBC 12.2 (H) 12/19/2021   HGB 10.4 (L) 12/19/2021   HCT 30.3 (L) 12/19/2021   MCV 92.9 12/19/2021   PLT 220 12/19/2021    Lab Results  Component Value Date   CREATININE 6.98 (H) 12/19/2021   BUN 60 (H) 12/19/2021   NA 132 (L) 12/19/2021   K 3.4 (L) 12/19/2021   CL 93 (L) 12/19/2021   CO2 21 (L) 12/19/2021    Lab Results  Component Value Date    ALT 8 12/17/2021   AST 11 (L) 12/17/2021   ALKPHOS 80 12/17/2021   BILITOT 1.1 12/17/2021        Laurice Record, [facility] 12/20/2021, 3:01 PM

## 2021-12-20 NOTE — Progress Notes (Signed)
PROGRESS NOTE    Diamond [name]  ATF:[id number] DOB: [birth year] DOA: 12/16/2021 PCP: [name], MD   Chief Complaint  Patient presents with   Back Pain    Brief Narrative:   Diamond [name] is a 71 y.o. female with medical history significant for incisional disease on hemodialysis on Tuesday, Thursday, Saturday schedule, mild intermittent asthma, hypertension, anemia of chronic kidney disease, who is admitted to [facility] on 12/16/2021 with severe sepsis after presenting from home to [facility] ED complaining of right-sided back pain.  Her work-up was significant for sepsis, blood pressure came back growing MRSA.  Will her 2D echo suspicious for endocarditis, and MRI lower back significant for discitis/osteomyelitis.      Assessment & Plan:   Principal Problem:   Severe sepsis ([place]) Active Problems:   Hypertension   Anemia of chronic disease   Leukocytosis   Lactic acidosis   Low back pain   ESRD (end stage renal disease) (HCC)   Mild intermittent asthma   Sepsis, present on admission, due to bacteremia/discitis/possible endocarditis -Present on admission, leukocytosis 20 K, tachycardic 109, tachypneic 26, with elevated lactic acid of 2.7. -Blood culture showing MRSA , currently on IV vancomycin -As well possible urinary source as evidence of cystitis on imaging. -2D echo significant for possible mitral valve vegetation, will need TEE, plan for Tuesday -Lumbar/thoracic spine MRI significant for discitis/osteomyelitis will need prolonged IV antibiotic therapy -Will need line holiday, vascular surgery consulted,  tunneled cath removed 1/14 by vascular surgery, plan likely for AV fistula next Wednesday if cultures remain negative.  -ID input greatly appreciated. -Started on oxycodone for lower back pain.  Will discontinue IV fentanyl.   End-stage renal disease:  - On hemodialysis on Tuesday, Thursday, Saturday schedule.  Renal consulted -Next dialysis possibly on  Wednesday as she is currently on line holiday.      Mild intermittent asthma:  -  Continue home prn albuterol inhaler.  Add on serum magnesium level.   Anemia of chronic kidney disease:  -Procrit and IV iron per renal      Hypertension:  -Continue with verapamil, holding hydrochlorothiazide, will add as needed hydralazine.         DVT prophylaxis: Heparin Code Status: Full Family Communication: none at bedside, I have discussed with patient at length and answered her questions. Disposition:   Status is: Inpatient  The patient will require care spanning > 2 midnights and should be moved to inpatient because: Now she is with a gram-positive cocci bacteremia, in need of IV antibiotic and further work-up.      Consultants:  Renal ID  vascular   Subjective:   No fever, no chills, still complaining of lower back pain, as well she does report constipation. Objective: Vitals:   12/20/21 0451 12/20/21 0517 12/20/21 0749 12/20/21 1157  BP:  128/67 125/78 134/83  Pulse:   79 87  Resp:   19 12  Temp:   98.6 F (37 C) 98.6 F (37 C)  TempSrc:   Axillary Axillary  SpO2:   95% 97%  Weight: 64.5 kg     Height:        Intake/Output Summary (Last 24 hours) at 12/20/2021 1315 Last data filed at 12/20/2021 0600 Gross per 24 hour  Intake 350 ml  Output --  Net 350 ml   Filed Weights   12/19/21 0750 12/19/21 1052 12/20/21 0451  Weight: 66.7 kg 65 kg 64.5 kg    Examination:  Awake Alert, Oriented X  3, No new F.N deficits, Normal affect Symmetrical Chest wall movement, Good air movement bilaterally, CTAB RRR,No Gallops,Rubs or new Murmurs, No Parasternal Heave +ve B.Sounds, Abd Soft, No tenderness, No rebound - guarding or rigidity. No Cyanosis, Clubbing or edema, No new Rash or bruise          Data Reviewed: I have personally reviewed following labs and imaging studies  CBC: Recent Labs  Lab 12/16/21 1450 12/17/21 0554 12/19/21 0644  WBC 20.5* 20.3* 12.2*   NEUTROABS 19.0* 18.3*  --   HGB 12.5 11.3* 10.4*  HCT 39.0 36.0 30.3*  MCV 97.5 100.8* 92.9  PLT 210 153 474    Basic Metabolic Panel: Recent Labs  Lab 12/16/21 1450 12/17/21 0554 12/18/21 0736 12/19/21 0630  NA 130* 133* 132* 132*  K 4.5 5.4* 3.5 3.4*  CL 97* 94* 98 93*  CO2 15* 14* 21* 21*  GLUCOSE 102* 82 93 101*  BUN 73* 80* 40* 60*  CREATININE 9.73* 9.58* 5.93* 6.98*  CALCIUM 9.9 8.9 8.9 9.5  MG  --  1.9  --   --   PHOS  --  7.4* 5.8* 5.9*    GFR: Estimated Creatinine Clearance: 7.1 mL/min (A) (by C-G formula based on SCr of 6.98 mg/dL (H)).  Liver Function Tests: Recent Labs  Lab 12/16/21 1450 12/17/21 0554 12/17/21 1251 12/17/21 1437 12/18/21 0736 12/19/21 0630  AST 15 16 18  11*  --   --   ALT 10 8 7 8   --   --   ALKPHOS 71 92 67 80  --   --   BILITOT 1.1 1.3* 1.6* 1.1  --   --   PROT 7.2 6.3* 5.5* 6.7  --   --   ALBUMIN 3.0* 2.5* 2.4* 2.5* 2.2* 2.0*    CBG: No results for input(s): GLUCAP in the last 168 hours.   Recent Results (from the past 240 hour(s))  Resp Panel by RT-PCR (Flu A&B, Covid) Nasopharyngeal Swab     Status: None   Collection Time: 12/16/21  6:02 PM   Specimen: Nasopharyngeal Swab; Nasopharyngeal(NP) swabs in vial transport medium  Result Value Ref Range Status   SARS Coronavirus 2 by RT PCR NEGATIVE NEGATIVE Final    Comment: (NOTE) SARS-CoV-2 target nucleic acids are NOT DETECTED.  The SARS-CoV-2 RNA is generally detectable in upper respiratory specimens during the acute phase of infection. The lowest concentration of SARS-CoV-2 viral copies this assay can detect is 138 copies/mL. A negative result does not preclude SARS-Cov-2 infection and should not be used as the sole basis for treatment or other patient management decisions. A negative result may occur with  improper specimen collection/handling, submission of specimen other than nasopharyngeal swab, presence of viral mutation(s) within the areas targeted by this  assay, and inadequate number of viral copies(<138 copies/mL). A negative result must be combined with clinical observations, patient history, and epidemiological information. The expected result is Negative.  Fact Sheet for Patients:  EntrepreneurPulse.com.au  Fact Sheet for Healthcare Providers:  IncredibleEmployment.be  This test is no t yet approved or cleared by the [place] FDA and  has been authorized for detection and/or diagnosis of SARS-CoV-2 by FDA under an Emergency Use Authorization (EUA). This EUA will remain  in effect (meaning this test can be used) for the duration of the COVID-19 declaration under Section 564(b)(1) of the Act, 21 U.S.C.section 360bbb-3(b)(1), unless the authorization is terminated  or revoked sooner.       Influenza A by PCR NEGATIVE  NEGATIVE Final   Influenza B by PCR NEGATIVE NEGATIVE Final    Comment: (NOTE) The Xpert Xpress SARS-CoV-2/FLU/RSV plus assay is intended as an aid in the diagnosis of influenza from Nasopharyngeal swab specimens and should not be used as a sole basis for treatment. Nasal washings and aspirates are unacceptable for Xpert Xpress SARS-CoV-2/FLU/RSV testing.  Fact Sheet for Patients: EntrepreneurPulse.com.au  Fact Sheet for Healthcare Providers: IncredibleEmployment.be  This test is not yet approved or cleared by the [place] FDA and has been authorized for detection and/or diagnosis of SARS-CoV-2 by FDA under an Emergency Use Authorization (EUA). This EUA will remain in effect (meaning this test can be used) for the duration of the COVID-19 declaration under Section 564(b)(1) of the Act, 21 U.S.C. section 360bbb-3(b)(1), unless the authorization is terminated or revoked.  Performed at [facility], [place] [address], [place], [place] [address]   Urine Culture     Status: None   Collection Time: 12/16/21 11:49 PM   Specimen:  Urine, Clean Catch  Result Value Ref Range Status   Specimen Description URINE, CLEAN CATCH  Final   Special Requests NONE  Final   Culture   Final    NO GROWTH Performed at [facility], [place] [address] [place], [place] [address]    Report Status 12/17/2021 FINAL  Final  Culture, blood (routine x 2)     Status: Abnormal   Collection Time: 12/17/21  1:14 AM   Specimen: BLOOD LEFT HAND  Result Value Ref Range Status   Specimen Description BLOOD LEFT HAND  Final   Special Requests   Final    BOTTLES DRAWN AEROBIC AND ANAEROBIC Blood Culture adequate volume   Culture  Setup Time   Final    GRAM POSITIVE COCCI IN CLUSTERS IN BOTH AEROBIC AND ANAEROBIC BOTTLES CRITICAL VALUE NOTED.  VALUE IS CONSISTENT WITH PREVIOUSLY REPORTED AND CALLED VALUE.    Culture (A)  Final    STAPHYLOCOCCUS AUREUS SUSCEPTIBILITIES PERFORMED ON PREVIOUS CULTURE WITHIN THE LAST 5 DAYS. Performed at [facility], [place] [address] [place], [place] [address]    Report Status 12/19/2021 FINAL  Final  Culture, blood (routine x 2)     Status: Abnormal   Collection Time: 12/17/21  1:19 AM   Specimen: BLOOD  Result Value Ref Range Status   Specimen Description BLOOD SITE NOT SPECIFIED  Final   Special Requests   Final    BOTTLES DRAWN AEROBIC AND ANAEROBIC Blood Culture results may not be optimal due to an inadequate volume of blood received in culture bottles   Culture  Setup Time   Final    GRAM POSITIVE COCCI IN CLUSTERS IN BOTH AEROBIC AND ANAEROBIC BOTTLES CRITICAL RESULT CALLED TO, READ BACK BY AND VERIFIED WITHLoletha Grayer AMEND PHARMD [id number] 12/17/21 A BROWNING Performed at [facility], [place] [address] [place], [place] [address]    Culture METHICILLIN RESISTANT STAPHYLOCOCCUS AUREUS (A)  Final   Report Status 12/19/2021 FINAL  Final   Organism ID, Bacteria METHICILLIN RESISTANT STAPHYLOCOCCUS AUREUS  Final      Susceptibility   Methicillin resistant staphylococcus aureus - MIC*    CIPROFLOXACIN  <=0.5 SENSITIVE Sensitive     ERYTHROMYCIN <=0.25 SENSITIVE Sensitive     GENTAMICIN <=0.5 SENSITIVE Sensitive     OXACILLIN RESISTANT Resistant     TETRACYCLINE >=16 RESISTANT Resistant     VANCOMYCIN <=0.5 SENSITIVE Sensitive     TRIMETH/SULFA <=10 SENSITIVE Sensitive  CLINDAMYCIN <=0.25 SENSITIVE Sensitive     RIFAMPIN <=0.5 SENSITIVE Sensitive     Inducible Clindamycin NEGATIVE Sensitive     * METHICILLIN RESISTANT STAPHYLOCOCCUS AUREUS  Blood Culture ID Panel (Reflexed)     Status: Abnormal   Collection Time: 12/17/21  1:19 AM  Result Value Ref Range Status   Enterococcus faecalis NOT DETECTED NOT DETECTED Final   Enterococcus Faecium NOT DETECTED NOT DETECTED Final   Listeria monocytogenes NOT DETECTED NOT DETECTED Final   Staphylococcus species DETECTED (A) NOT DETECTED Final    Comment: CRITICAL RESULT CALLED TO, READ BACK BY AND VERIFIED WITH: C AMEND PHARMD 1613 12/17/21 A BROWNING    Staphylococcus aureus (BCID) DETECTED (A) NOT DETECTED Final    Comment: Methicillin (oxacillin)-resistant Staphylococcus aureus (MRSA). MRSA is predictably resistant to beta-lactam antibiotics (except ceftaroline). Preferred therapy is vancomycin unless clinically contraindicated. Patient requires contact precautions if  hospitalized. CRITICAL RESULT CALLED TO, READ BACK BY AND VERIFIED WITH: C AMEND PHARMD [id number] 12/17/21 A BROWNING    Staphylococcus epidermidis NOT DETECTED NOT DETECTED Final   Staphylococcus lugdunensis NOT DETECTED NOT DETECTED Final   Streptococcus species NOT DETECTED NOT DETECTED Final   Streptococcus agalactiae NOT DETECTED NOT DETECTED Final   Streptococcus pneumoniae NOT DETECTED NOT DETECTED Final   Streptococcus pyogenes NOT DETECTED NOT DETECTED Final   A.calcoaceticus-baumannii NOT DETECTED NOT DETECTED Final   Bacteroides fragilis NOT DETECTED NOT DETECTED Final   Enterobacterales NOT DETECTED NOT DETECTED Final   Enterobacter cloacae complex NOT DETECTED NOT  DETECTED Final   Escherichia coli NOT DETECTED NOT DETECTED Final   Klebsiella aerogenes NOT DETECTED NOT DETECTED Final   Klebsiella oxytoca NOT DETECTED NOT DETECTED Final   Klebsiella pneumoniae NOT DETECTED NOT DETECTED Final   Proteus species NOT DETECTED NOT DETECTED Final   Salmonella species NOT DETECTED NOT DETECTED Final   Serratia marcescens NOT DETECTED NOT DETECTED Final   Haemophilus influenzae NOT DETECTED NOT DETECTED Final   Neisseria meningitidis NOT DETECTED NOT DETECTED Final   Pseudomonas aeruginosa NOT DETECTED NOT DETECTED Final   Stenotrophomonas maltophilia NOT DETECTED NOT DETECTED Final   Candida albicans NOT DETECTED NOT DETECTED Final   Candida auris NOT DETECTED NOT DETECTED Final   Candida glabrata NOT DETECTED NOT DETECTED Final   Candida krusei NOT DETECTED NOT DETECTED Final   Candida parapsilosis NOT DETECTED NOT DETECTED Final   Candida tropicalis NOT DETECTED NOT DETECTED Final   Cryptococcus neoformans/gattii NOT DETECTED NOT DETECTED Final   Meth resistant mecA/C and MREJ DETECTED (A) NOT DETECTED Final    Comment: CRITICAL RESULT CALLED TO, READ BACK BY AND VERIFIED WITHLoletha Grayer AMEND PHARMD 1613 12/17/21 A BROWNING Performed at [facility], [address], [place], [place] [address]   MRSA Next Gen by PCR, Nasal     Status: Abnormal   Collection Time: 12/19/21 12:40 AM   Specimen: Nasal Mucosa; Nasal Swab  Result Value Ref Range Status   MRSA by PCR Next Gen DETECTED (A) NOT DETECTED Final    Comment: RESULT CALLED TO, READ BACK BY AND VERIFIED WITH: RN M.ENDISON 12/19/21@5 :45 BY TW (NOTE) The GeneXpert MRSA Assay (FDA approved for NASAL specimens only), is one component of a comprehensive MRSA colonization surveillance program. It is not intended to diagnose MRSA infection nor to guide or monitor treatment for MRSA infections. Test performance is not FDA approved in patients less than 39 years old. Performed at [facility],  [place] [address] [place], [place] [address]  Radiology Studies: MR THORACIC SPINE WO CONTRAST  Result Date: 12/18/2021 CLINICAL DATA:  Back pain with MRSA bacteremia EXAM: MRI THORACIC SPINE WITHOUT CONTRAST TECHNIQUE: Multiplanar, multisequence MR imaging of the thoracic spine was performed. No intravenous contrast was administered. COMPARISON:  None. FINDINGS: Alignment: Accentuation of kyphosis. Anteroposterior alignment is maintained. Vertebrae: Mild degenerative endplate irregularity. Marrow signal is mildly heterogeneous. No substantial marrow edema. Cord:  No abnormal signal. Paraspinal and other soft tissues: Trace right pleural effusion. Disc levels: Minor degenerative changes are present with small disc bulges and protrusions and mild facet arthropathy. The spinal canal is widely patent. There is no significant foraminal narrowing. Partially imaged degenerative disc disease at lower cervical levels with canal stenosis. IMPRESSION: No evidence of discitis/osteomyelitis in the thoracic spine. Electronically Signed   By: Macy Mis M.D.   On: 12/18/2021 17:19   MR LUMBAR SPINE WO CONTRAST  Result Date: 12/18/2021 CLINICAL DATA:  Back pain with MRSA bacteremia EXAM: MRI LUMBAR SPINE WITHOUT CONTRAST TECHNIQUE: Multiplanar, multisequence MR imaging of the lumbar spine was performed. No intravenous contrast was administered. COMPARISON:  None. FINDINGS: Motion artifact is present. Segmentation: Transitional anatomy with sacralized L5 and remained tree L5-S1 disc. Alignment:  Grade 1 anterolisthesis at L3-L4. Vertebrae: Schmorl's node at the superior endplate of L2. Endplate irregularity and marrow edema and sclerosis at L5-S1. Otherwise heterogeneous marrow signal Conus medullaris and cauda equina: Conus extends to the L1 level. Conus and cauda equina appear normal. Paraspinal and other soft tissues: Right psoas edema at L5-S1. Disc levels: L1-L2:  Disc bulge.  Minor canal stenosis.  No  foraminal stenosis. L2-L3:  No canal or foraminal stenosis. L3-L4: Anterolisthesis with uncovering of disc bulge. Left greater than right facet arthropathy with ligamentum flavum infolding. There is a left facet joint effusion. Moderate canal stenosis. Partial effacement subarticular recesses. Minor right and mild left foraminal stenosis. L4-L5: Disc edema. Disc bulge with endplate osteophytic ridging. Facet arthropathy with ligamentum flavum infolding. Moderate to marked canal stenosis. Effacement of the right greater than left subarticular recesses. Mild right and mild to moderate left foraminal stenosis. There is some ill-defined signal in the ventral epidural space above and below disc level. L5-S1:  Transitional level with hypoplastic disc.  No stenosis. IMPRESSION: Endplate irregularity and marrow edema with disc edema at L5-S1. Mild right psoas edema. Some ill-defined signal in the ventral epidural space above and below this disc level. This may be degenerative in etiology or reflect discitis/osteomyelitis. Degenerative changes result in moderate to marked canal stenosis at L4-L5 with narrowing of subarticular recesses. Electronically Signed   By: Macy Mis M.D.   On: 12/18/2021 17:15   ECHOCARDIOGRAM COMPLETE  Result Date: 12/18/2021    ECHOCARDIOGRAM REPORT   Patient Name:   KARIMAH WINQUIST Date of Exam: 12/18/2021 Medical Rec #:  [id number]      Height:       67.0 in Accession #:    [id number]     Weight:       152.0 lb Date of Birth:  [birth year]     BSA:          1.800 m Patient Age:    73 years       BP:           138/83 mmHg Patient Gender: F              HR:           90 bpm. Exam Location:  Inpatient Procedure: 2D Echo Indications:  Bacteremia  History:        Patient has prior history of Echocardiogram examinations, most                 recent 11/06/2019. Risk Factors:Hypertension. Dysrhythmia.  Sonographer:    Arlyss Gandy Referring Phys: [place]  1. Left  ventricular ejection fraction, by estimation, is 60 to 65%. The left ventricle has normal function. The left ventricle has no regional wall motion abnormalities. There is moderate hypertrophy of the basal septum. The rest of the LV segments demonstrate mild left ventricular hypertrophy. Left ventricular diastolic parameters are consistent with Grade I diastolic dysfunction (impaired relaxation).  2. Right ventricular systolic function is normal. The right ventricular size is normal. There is normal pulmonary artery systolic pressure.  3. The mitral valve leaflets are thickened. There are fibrinous densities visualized on the anterior mitral valve leaflet on the apical 3C view. Given underlying bacteremia, findings concerning for vegetation formation (versus degenerative valve changes). There is trivial mitral regurgitation and no evidence of valve destruction. Recommend TEE for further evaluation.  4. The aortic valve is tricuspid. There is mild calcification of the aortic valve. There is moderate thickening of the aortic valve. Aortic valve regurgitation is trivial. Aortic valve sclerosis/calcification is present, without any evidence of aortic stenosis.  5. Aortic dilatation noted. There is mild dilatation of the ascending aorta, measuring 40 mm.  6. There is a highly mobile, fibrinous structure visualized in the RA most likely a eustachian valve or chiari network. Comparison(s): Compared to prior TTE in 11/2019, the mitral valve leaflets continue to be thickened, however, there is concern for possible vegetation on current study. Otherwise, the TR appears mild (previously mod-to-severe). Conclusion(s)/Recommendation(s): Findings concerning for mitral valve vegetation, would recommend a Transesophageal Echocardiogram for clarification. FINDINGS  Left Ventricle: Left ventricular ejection fraction, by estimation, is 60 to 65%. The left ventricle has normal function. The left ventricle has no regional wall motion  abnormalities. The left ventricular internal cavity size was normal in size. There is  moderate hypertrophy of the basal septum. The rest of the LV segments demonstrate mild left ventricular hypertrophy. Left ventricular diastolic parameters are consistent with Grade I diastolic dysfunction (impaired relaxation). Right Ventricle: The right ventricular size is normal. No increase in right ventricular wall thickness. Right ventricular systolic function is normal. There is normal pulmonary artery systolic pressure. The tricuspid regurgitant velocity is 2.84 m/s, and  with an assumed right atrial pressure of 3 mmHg, the estimated right ventricular systolic pressure is 22.2 mmHg. Left Atrium: Left atrial size was normal in size. Right Atrium: There is a highly mobile, fibrinous structure visualized in the RA most likely a eustachian valve or chiari network. Right atrial size was normal in size. Pericardium: There is no evidence of pericardial effusion. Mitral Valve: The mitral valve leaflets are thickened. There are fibrinous densities visualized on the anterior mitral valve leaflet on the apical 3C view. Given underlying bacteremia, findings concerning for vegetation formation (versus degenerative valve changes). There is trivial mitral regurgitation and no evidence of valve destruction. Recommend TEE for further evaluation. The mitral valve is abnormal. There is moderate thickening of the mitral valve leaflet(s). Mild mitral annular calcification. Trivial mitral valve regurgitation. Tricuspid Valve: The tricuspid valve is normal in structure. Tricuspid valve regurgitation is mild. Aortic Valve: The aortic valve is tricuspid. There is mild calcification of the aortic valve. There is moderate thickening of the aortic valve. Aortic valve regurgitation is trivial. Aortic valve sclerosis/calcification  is present, without any evidence of aortic stenosis. Aortic valve mean gradient measures 7.0 mmHg. Aortic valve peak  gradient measures 12.7 mmHg. Aortic valve area, by VTI measures 3.19 cm. Pulmonic Valve: The pulmonic valve was normal in structure. Pulmonic valve regurgitation is trivial. Aorta: Aortic dilatation noted. There is mild dilatation of the ascending aorta, measuring 40 mm. IAS/Shunts: No atrial level shunt detected by color flow Doppler.  LEFT VENTRICLE PLAX 2D LVIDd:         4.00 cm   Diastology LVIDs:         2.80 cm   LV e' medial:    8.10 cm/s LV PW:         1.30 cm   LV E/e' medial:  11.3 LV IVS:        1.30 cm   LV e' lateral:   11.80 cm/s LVOT diam:     2.20 cm   LV E/e' lateral: 7.7 LV SV:         98 LV SV Index:   54 LVOT Area:     3.80 cm  RIGHT VENTRICLE             IVC RV Basal diam:  3.60 cm     IVC diam: 2.00 cm RV S prime:     16.70 cm/s TAPSE (M-mode): 2.6 cm LEFT ATRIUM             Index        RIGHT ATRIUM           Index LA Vol (A2C):   60.2 ml 33.45 ml/m  RA Area:     14.20 cm LA Vol (A4C):   47.4 ml 26.34 ml/m  RA Volume:   33.30 ml  18.50 ml/m LA Biplane Vol: 54.4 ml 30.23 ml/m  AORTIC VALVE AV Area (Vmax):    2.99 cm AV Area (Vmean):   3.13 cm AV Area (VTI):     3.19 cm AV Vmax:           178.00 cm/s AV Vmean:          117.000 cm/s AV VTI:            0.307 m AV Peak Grad:      12.7 mmHg AV Mean Grad:      7.0 mmHg LVOT Vmax:         140.00 cm/s LVOT Vmean:        96.200 cm/s LVOT VTI:          0.258 m LVOT/AV VTI ratio: 0.84  AORTA Ao Root diam: 3.20 cm Ao Asc diam:  4.00 cm MITRAL VALVE               TRICUSPID VALVE MV Area (PHT): 4.41 cm    TR Peak grad:   32.3 mmHg MV Decel Time: 172 msec    TR Vmax:        284.00 cm/s MV E velocity: 91.20 cm/s MV A velocity: 99.60 cm/s  SHUNTS MV E/A ratio:  0.92        Systemic VTI:  0.26 m                            Systemic Diam: 2.20 cm Gwyndolyn Kaufman MD Electronically signed by Gwyndolyn Kaufman MD Signature Date/Time: 12/18/2021/4:24:40 PM    Final         Scheduled Meds:  Chlorhexidine Gluconate Cloth  6 each Topical Q0600  heparin injection (subcutaneous)  5,000 Units Subcutaneous Q8H   mupirocin ointment  1 application Nasal BID   polyethylene glycol  34 g Oral Daily   senna-docusate  2 tablet Oral BID   verapamil  120 mg Oral Q8H   Continuous Infusions:  vancomycin       LOS: 3 days       Phillips Climes, MD [facility]   To contact the attending provider between 7A-7P or the covering provider during after hours 7P-7A, please log into the web site [email or web address] and access using universal [place] password for that web site. If you do not have the password, please call the hospital operator.  12/20/2021, 1:15 PM   Patient ID: SHERMEKA RUTT, female   DOB: March 23, 1951, 71 y.o.   MRN: [id number] Patient ID: NIVEA WOJDYLA, female   DOB: 1951/05/13, 71 y.o.   MRN: [id number] Patient ID: JAHLISA ROSSITTO, female   DOB: 18-Sep-1951, 71 y.o.   MRN: [id number] Patient ID: CARMALETA YOUNGERS, female   DOB: 1951/03/25, 71 y.o.   MRN: [id number]

## 2021-12-20 NOTE — Evaluation (Signed)
Physical Therapy Evaluation Patient Details Name: Diamond [name] MRN: [id number] DOB: 01-23-1951 Today's Date: 12/20/2021  History of Present Illness  Pt is a 71 y.o. F who presents 12/16/2021 with severe sepsis after presenting from home complaining of right sided back pain. Blood culture showing MRSA. 2D echo significant for possible mitral valve vegetation. Lumbar/thoracic spine MRI significant for discitis/osteomyelitis. Significant PMH: ESRD, asthma, HTN.  Clinical Impression  Prior to admission, pt lives alone and is independent. Pt presents with decreased functional mobility secondary to generalized weakness, balance deficits, impaired cognition, back pain, decreased activity tolerance. Pt requiring maximal assist for bed mobility and is unable to stand. Reporting 10/10 back pain during session and RN present to provide IV pain medications. Attempted a variety of methods to initiate standing I.e. walker vs face to face vs Stedy, but pt ultimately unable to achieve (this was largely affected by difficulty following commands and problem solving as well in addition to weakness). In light of deficits, recommend SNF to address deficits and maximize functional mobility.      Recommendations for follow up therapy are one component of a multi-disciplinary discharge planning process, led by the attending physician.  Recommendations may be updated based on patient status, additional functional criteria and insurance authorization.  Follow Up Recommendations Skilled nursing-short term rehab (<3 hours/day)    Assistance Recommended at Discharge Frequent or constant Supervision/Assistance  Patient can return home with the following  Two people to help with walking and/or transfers;A lot of help with bathing/dressing/bathroom;Assistance with cooking/housework;Assist for transportation;Help with stairs or ramp for entrance    Equipment Recommendations BSC/3in1;Wheelchair (measurements PT);Wheelchair  cushion (measurements PT);Hospital bed  Recommendations for Other Services       Functional Status Assessment Patient has had a recent decline in their functional status and demonstrates the ability to make significant improvements in function in a reasonable and predictable amount of time.     Precautions / Restrictions Precautions Precautions: Fall Restrictions Weight Bearing Restrictions: No      Mobility  Bed Mobility Overal bed mobility: Needs Assistance Bed Mobility: Supine to Sit;Sit to Supine     Supine to sit: Max assist Sit to supine: Max assist   General bed mobility comments: Assist for BLE's and trunk, pt not initiating, cues for use of rail. Heavily resistant to lying back down    Transfers Overall transfer level: Needs assistance                 General transfer comment: Ultimately unable. Attempted walker vs face to face vs Stedy with bed elevated significantly. Pt not initiating or bearing weight through legs to facilitate transfer    Ambulation/Gait               General Gait Details: unable  Stairs            Wheelchair Mobility    Modified Rankin (Stroke Patients Only)       Balance Overall balance assessment: Needs assistance Sitting-balance support: Feet supported Sitting balance-Leahy Scale: Fair Sitting balance - Comments: close supervision                                     Pertinent Vitals/Pain Pain Assessment: Faces Faces Pain Scale: Hurts worst Pain Location: back Pain Descriptors / Indicators: Grimacing;Guarding;Sharp Pain Intervention(s): Limited activity within patient's tolerance;Monitored during session;Patient requesting pain meds-RN notified    Home Living Family/patient expects to be discharged  to:: Private residence Living Arrangements: Alone Available Help at Discharge: Family;Available PRN/intermittently Type of Home: House Home Access: Level entry       Home Layout: One  level Home Equipment: Conservation officer, nature (2 wheels)      Prior Function Prior Level of Function : Independent/Modified Independent             Mobility Comments: driving self to dialysis       Hand Dominance        Extremity/Trunk Assessment   Upper Extremity Assessment Upper Extremity Assessment: Defer to OT evaluation    Lower Extremity Assessment Lower Extremity Assessment: Generalized weakness    Cervical / Trunk Assessment Cervical / Trunk Assessment: Normal  Communication   Communication: No difficulties  Cognition Arousal/Alertness: Awake/alert Behavior During Therapy: WFL for tasks assessed/performed Overall Cognitive Status: Impaired/Different from baseline Area of Impairment: Following commands;Safety/judgement;Awareness;Problem solving                       Following Commands: Follows one step commands inconsistently Safety/Judgement: Decreased awareness of safety;Decreased awareness of deficits Awareness: Intellectual Problem Solving: Slow processing;Decreased initiation;Requires verbal cues General Comments: Very poor command following, initiation and sequencing. Pt stating PT was "rushing her," despite extended period of time allowed for each task, does not demonstrate initiation after repetitive cueing. Decreased awareness of deficits overall        General Comments General comments (skin integrity, edema, etc.): VSS    Exercises     Assessment/Plan    PT Assessment Patient needs continued PT services  PT Problem List Decreased strength;Decreased activity tolerance;Decreased mobility;Decreased balance;Decreased cognition;Decreased safety awareness;Pain       PT Treatment Interventions DME instruction;Gait training;Functional mobility training;Therapeutic activities;Therapeutic exercise;Balance training;Patient/family education    PT Goals (Current goals can be found in the Care Plan section)  Acute Rehab PT Goals Patient Stated Goal:  to get out of bed PT Goal Formulation: With patient Time For Goal Achievement: 01/03/22 Potential to Achieve Goals: Good    Frequency Min 2X/week     Co-evaluation               AM-PAC PT "6 Clicks" Mobility  Outcome Measure Help needed turning from your back to your side while in a flat bed without using bedrails?: A Lot Help needed moving from lying on your back to sitting on the side of a flat bed without using bedrails?: A Lot Help needed moving to and from a bed to a chair (including a wheelchair)?: Total Help needed standing up from a chair using your arms (e.g., wheelchair or bedside chair)?: Total Help needed to walk in hospital room?: Total Help needed climbing 3-5 steps with a railing? : Total 6 Click Score: 8    End of Session Equipment Utilized During Treatment: Gait belt Activity Tolerance: Patient limited by fatigue;No increased pain Patient left: in bed;with call bell/phone within reach;with bed alarm set Nurse Communication: Mobility status PT Visit Diagnosis: Muscle weakness (generalized) (M62.81);Difficulty in walking, not elsewhere classified (R26.2);Pain Pain - part of body:  (back)    Time: 4287-6811 PT Time Calculation (min) (ACUTE ONLY): 45 min   Charges:   PT Evaluation $PT Eval Moderate Complexity: 1 Mod PT Treatments $Therapeutic Activity: 23-37 mins        Wyona Almas, PT, DPT Acute Rehabilitation Services Pager [phone] Office [phone]   Deno Etienne 12/20/2021, 9:48 AM

## 2021-12-20 NOTE — Evaluation (Signed)
Occupational Therapy Evaluation Patient Details Name: Diamond [name] MRN: [id number] DOB: 1951/10/28 Today's Date: 12/20/2021   History of Present Illness Pt is a 71 y.o. F who presents 12/16/2021 with severe sepsis after presenting from home complaining of right sided back pain. Blood culture showing MRSA. 2D echo significant for possible mitral valve vegetation. Lumbar/thoracic spine MRI significant for discitis/osteomyelitis. Significant PMH: ESRD, asthma, HTN.   Clinical Impression   Diamond [name] reports being generally indep PTA including driving herself to dialysis, but intermittently uses RW when she feels she needs extra support. She lives alone in a 1 level home with a level entry, she has two children who both work and cannot provide 24/7 at d/c. Upon evaluation pt required significantly increased time and gentle encouragement for all tasks and was ultimately limited by lower back pain (exacerbated by movement), and generalized weakness. Overall she required max A for bed mobility and mod A for 1x sit<>stand transfer. She is also requiring up to max A for ADLs. Pt's daughter present and extremely supportive, she was able to encourage pt to attempt OOB transfer. Pt will benefit from OT acutely to progress OOB function. Due to pts heavy assist levels and poor activity tolerance, recommend SNF level therapies at d/c.      Recommendations for follow up therapy are one component of a multi-disciplinary discharge planning process, led by the attending physician.  Recommendations may be updated based on patient status, additional functional criteria and insurance authorization.   Follow Up Recommendations  Skilled nursing-short term rehab (<3 hours/day)    Assistance Recommended at Discharge Frequent or constant Supervision/Assistance  Patient can return home with the following A lot of help with walking and/or transfers;A lot of help with bathing/dressing/bathroom;Assistance with  cooking/housework;Assist for transportation;Help with stairs or ramp for entrance    Functional Status Assessment  Patient has had a recent decline in their functional status and demonstrates the ability to make significant improvements in function in a reasonable and predictable amount of time.  Equipment Recommendations  BSC/3in1;Wheelchair (measurements OT)    Recommendations for Other Services       Precautions / Restrictions Precautions Precautions: Fall Restrictions Weight Bearing Restrictions: No      Mobility Bed Mobility Overal bed mobility: Needs Assistance Bed Mobility: Supine to Sit     Supine to sit: Max assist     General bed mobility comments: assist to adjust hips towards EOB and the elevate trunk (very painful)    Transfers Overall transfer level: Needs assistance Equipment used: 1 person hand held assist Transfers: Sit to/from Stand Sit to Stand: Mod assist           General transfer comment: mod A for face to face transfer. tolerated ~1 minute of standing prior to needeing to sit.      Balance Overall balance assessment: Needs assistance Sitting-balance support: Feet supported Sitting balance-Leahy Scale: Fair Sitting balance - Comments: initally required suppor for L lateral/posterior lean. After back pain subsided pt was superivion for sitting balance     Standing balance-Leahy Scale: Poor Standing balance comment: required support of therapist                           ADL either performed or assessed with clinical judgement   ADL Overall ADL's : Needs assistance/impaired Eating/Feeding: Independent;Sitting   Grooming: Supervision/safety;Sitting   Upper Body Bathing: Minimal assistance;Sitting   Lower Body Bathing: Moderate assistance;Sit to/from stand   Upper Body Dressing :  Minimal assistance;Sitting   Lower Body Dressing: Maximal assistance;Sit to/from stand   Toilet Transfer: Maximal  assistance;Stand-pivot;Rolling walker (2 wheels)   Toileting- Clothing Manipulation and Hygiene: Maximal assistance;Sit to/from stand       Functional mobility during ADLs: Maximal assistance General ADL Comments: pt requires significantly increased time for all tasks due to back pain. Assist level s due to generalized weakness and for back pain exacerbation with some movements     Vision Baseline Vision/History: 1 Wears glasses Ability to See in Adequate Light: 0 Adequate Patient Visual Report: No change from baseline Vision Assessment?: No apparent visual deficits      Pertinent Vitals/Pain Pain Assessment: Faces Faces Pain Scale: Hurts worst Pain Location: back with change in position Pain Descriptors / Indicators: Grimacing;Sharp Pain Intervention(s): Monitored during session;Limited activity within patient's tolerance     Hand Dominance Right   Extremity/Trunk Assessment Upper Extremity Assessment Upper Extremity Assessment: Generalized weakness   Lower Extremity Assessment Lower Extremity Assessment: Generalized weakness   Cervical / Trunk Assessment Cervical / Trunk Assessment: Kyphotic (foreward head posture)   Communication Communication Communication: No difficulties   Cognition Arousal/Alertness: Awake/alert Behavior During Therapy: WFL for tasks assessed/performed Overall Cognitive Status: Impaired/Different from baseline Area of Impairment: Following commands;Safety/judgement;Awareness;Problem solving                       Following Commands: Follows one step commands with increased time Safety/Judgement: Decreased awareness of safety Awareness: Intellectual Problem Solving: Slow processing;Requires verbal cues;Decreased initiation General Comments: pt requires extremely gentle encouragement, does not like to feel "rushed." She follows commands with increased time and cues. Pt's daughter present and provided great motivation for pt     General  Comments  VSS on RA, pt's daughter and grand-daughter present and supportive            Home Living Family/patient expects to be discharged to:: Private residence Living Arrangements: Alone Available Help at Discharge: Family;Available PRN/intermittently Type of Home: House Home Access: Level entry     Home Layout: One level     Bathroom Shower/Tub: Walk-in shower         Home Equipment: Conservation officer, nature (2 wheels)   Additional Comments: Pt's children both work - unable to obtain 24/7      Prior Functioning/Environment Prior Level of Function : Independent/Modified Independent             Mobility Comments: uses RW on days when she feels like she needs it ADLs Comments: indep, drives self to dialysis        OT Problem List: Decreased strength;Decreased range of motion;Impaired balance (sitting and/or standing);Decreased activity tolerance;Decreased safety awareness;Decreased knowledge of use of DME or AE;Decreased knowledge of precautions;Pain      OT Treatment/Interventions:      OT Goals(Current goals can be found in the care plan section) Acute Rehab OT Goals Patient Stated Goal: less pain OT Goal Formulation: With patient Time For Goal Achievement: 01/03/22 Potential to Achieve Goals: Good ADL Goals Pt Will Perform Grooming: with modified independence;standing Pt Will Perform Lower Body Dressing: with modified independence;sit to/from stand Pt Will Transfer to Toilet: with modified independence;ambulating Additional ADL Goal #1: Pt will indep verbalize at least 3 energy conservation strategies to apply to the home setting  OT Frequency: Min 2X/week       AM-PAC OT "6 Clicks" Daily Activity     Outcome Measure Help from another person eating meals?: None Help from another person taking care of  personal grooming?: A Little Help from another person toileting, which includes using toliet, bedpan, or urinal?: A Lot Help from another person bathing  (including washing, rinsing, drying)?: A Lot Help from another person to put on and taking off regular upper body clothing?: A Little Help from another person to put on and taking off regular lower body clothing?: A Lot 6 Click Score: 16   End of Session Equipment Utilized During Treatment: Gait belt Nurse Communication: Mobility status (pt sitting EOB wtih family present)  Activity Tolerance: Patient tolerated treatment well Patient left: in bed;with call bell/phone within reach;with family/visitor present  OT Visit Diagnosis: Unsteadiness on feet (R26.81);Other abnormalities of gait and mobility (R26.89);Muscle weakness (generalized) (M62.81);Pain                Time: 5462-7035 OT Time Calculation (min): 34 min Charges:  OT General Charges $OT Visit: 1 Visit OT Evaluation $OT Eval Moderate Complexity: 1 Mod OT Treatments $Therapeutic Activity: 8-22 mins   Josely Moffat A Nicle Connole 12/20/2021, 2:26 PM

## 2021-12-20 NOTE — Progress Notes (Signed)
Pharmacy Antibiotic Note  Diamond [name] is a 71 y.o. female admitted on 12/16/2021 with 3 day hx of R-sided back pain and found to have MRSA bacteremia. Pt with ESRD on HD TTS schedule with last HD session 12/19/21. Pharmacy has been consulted for Vancomycin dosing.  Plan: Continue Vancomycin 750mg  IV qHD  Follow-up cx data and clinical progress. Level as need. Goal pre-HD level 15-25 mcg/ml.  Height: 5\' 7"  (170.2 cm) Weight: 64.5 kg (142 lb 3.2 oz) IBW/kg (Calculated) : 61.6  Temp (24hrs), Avg:99.4 F (37.4 C), Min:98.6 F (37 C), Max:99.8 F (37.7 C)  Recent Labs  Lab 12/16/21 1450 12/17/21 0041 12/17/21 0237 12/17/21 0554 12/17/21 0755 12/18/21 0736 12/19/21 0630 12/19/21 0644  WBC 20.5*  --   --  20.3*  --   --   --  12.2*  CREATININE 9.73*  --   --  9.58*  --  5.93* 6.98*  --   LATICACIDVEN  --  2.7* 2.1*  --  1.8  --   --   --      Estimated Creatinine Clearance: 7.1 mL/min (A) (by C-G formula based on SCr of 6.98 mg/dL (H)).    No Known Allergies  Antimicrobials this admission: Ceftriaxone 1/12>> Metronid 1/12>> Vanc 1/12 >> Cefepime x1  Microbiology results: 1/14 Bcx: pending  1/12 BCx x 2: MRSA 1/11 UCx: negative 1/11 COVID / Flu negative   Thank you for allowing pharmacy to participate in this patient's care.  Levonne Spiller, PharmD PGY1 Acute Care Resident  12/20/2021,8:46 AM

## 2021-12-20 NOTE — Progress Notes (Addendum)
Subjective: eating breakfast , co lower back discomfort "cannot work with that physical therapist may be if I have pain medicines could " after HD yesterday noted PermCath out yesterday by Dr. Luan Pulling plans for PermCath and AV fistula if blood cultures "negative prior to this" per Dr. Luan Pulling  Objective Vital signs in last 24 hours: Vitals:   12/20/21 0400 12/20/21 0451 12/20/21 0517 12/20/21 0749  BP: 140/74  128/67 125/78  Pulse: 94   79  Resp: (!) 23   19  Temp:    98.6 F (37 C)  TempSrc:    Axillary  SpO2: 94%   95%  Weight:  64.5 kg    Height:       Weight change:   Physical Exam: General: Alert elderly female in NAD Heart: RRR, soft 1/6 SEM no rub or gallop Lungs: CTA nonlabored breathing Abdomen: NABS, soft NTND no ascites Extremities: No pedal edema Dialysis Access: Right IJ dressing dry /clear from prior PC    Home meds include - zyloprim, colchicine, verapamil 120 tid, ultram prn, albuterol prn hfa, prns/ vits/ supps      OP HD: TTS HD at [facility]  4h  400/600  68.5kg  3K/2 bath  Hep 2000  access ?   - hect 4 ug tiw  - venofer 50 q wk  - no esa, last Hb 11.9     Problem/Plan: Sepsis/ MRSA bacteremia - suspected HD cath infection, ID seeing recommended IV vanc.  Repeat blood cultures ordered 01/14. Noted TTE showed MV vegetation, recommended TEE. MRI LS = L5/S1 degenerative changes possibly, but IR said to small to sample, and neurosurg looked at this and didn't think it was spine infection. TDC removed by [facility]VS 1/14 for line holiday, w/ plans new TDC and possibly avf, if bc neg, on Wed.   ESRD -HD TTS on schedule yest. 1/14, PermCath dc after HD, for line holiday, follow-up labs tomr .Next HD wed HTN/volume -no excess vol. yest. HD 1.8 L UF weight 65kg = below EDW 68.5 (lower EDW at discharge with wt. loss ) Anemia -Hgb 10.4 <11.5 no ESA needs, DC weekly Venofer with sepsis Secondary hyperparathyroidism -COrec calcium =11.1 phosphorus 5.9 <7.4 continue binders ,hold  vitamin D with ^ Ca ,used 2.0 calcium bath  Right-sided pulmonary nodule= follow-up intervention per primary/pulm Lower back pain= possible osteo-/discitis on MRI LS , pain management per admit  Diamond Haber, Diamond [name] Diamond [name] 12/20/2021,9:32 AM  LOS: 3 days   Pt seen, examined and agree w A/P as above.  Diamond Splinter  Diamond [name] 12/20/2021, 7:31 PM   Labs: Basic Metabolic Panel: Recent Labs  Lab 12/17/21 0554 12/18/21 0736 12/19/21 0630  NA 133* 132* 132*  K 5.4* 3.5 3.4*  CL 94* 98 93*  CO2 14* 21* 21*  GLUCOSE 82 93 101*  BUN 80* 40* 60*  CREATININE 9.58* 5.93* 6.98*  CALCIUM 8.9 8.9 9.5  PHOS 7.4* 5.8* 5.9*   Liver Function Tests: Recent Labs  Lab 12/17/21 0554 12/17/21 1251 12/17/21 1437 12/18/21 0736 12/19/21 0630  AST 16 18 11*  --   --   ALT 8 7 8   --   --   ALKPHOS 92 67 80  --   --   BILITOT 1.3* 1.6* 1.1  --   --   PROT 6.3* 5.5* 6.7  --   --   ALBUMIN 2.5* 2.4* 2.5* 2.2* 2.0*   No results for input(s): LIPASE, AMYLASE in the last 168 hours. No  results for input(s): AMMONIA in the last 168 hours. CBC: Recent Labs  Lab 12/16/21 1450 12/17/21 0554 12/19/21 0644  WBC 20.5* 20.3* 12.2*  NEUTROABS 19.0* 18.3*  --   HGB 12.5 11.3* 10.4*  HCT 39.0 36.0 30.3*  MCV 97.5 100.8* 92.9  PLT 210 153 220   Cardiac Enzymes: No results for input(s): CKTOTAL, CKMB, CKMBINDEX, TROPONINI in the last 168 hours. CBG: No results for input(s): GLUCAP in the last 168 hours.  Studies/Results: MR THORACIC SPINE WO CONTRAST  Result Date: 12/18/2021 CLINICAL DATA:  Back pain with MRSA bacteremia EXAM: MRI THORACIC SPINE WITHOUT CONTRAST TECHNIQUE: Multiplanar, multisequence MR imaging of the thoracic spine was performed. No intravenous contrast was administered. COMPARISON:  None. FINDINGS: Alignment: Accentuation of kyphosis. Anteroposterior alignment is maintained. Vertebrae: Mild degenerative endplate irregularity. Marrow signal is mildly  heterogeneous. No substantial marrow edema. Cord:  No abnormal signal. Paraspinal and other soft tissues: Trace right pleural effusion. Disc levels: Minor degenerative changes are present with small disc bulges and protrusions and mild facet arthropathy. The spinal canal is widely patent. There is no significant foraminal narrowing. Partially imaged degenerative disc disease at lower cervical levels with canal stenosis. IMPRESSION: No evidence of discitis/osteomyelitis in the thoracic spine. Electronically Signed   By: Macy Mis M.D.   On: 12/18/2021 17:19   MR LUMBAR SPINE WO CONTRAST  Result Date: 12/18/2021 CLINICAL DATA:  Back pain with MRSA bacteremia EXAM: MRI LUMBAR SPINE WITHOUT CONTRAST TECHNIQUE: Multiplanar, multisequence MR imaging of the lumbar spine was performed. No intravenous contrast was administered. COMPARISON:  None. FINDINGS: Motion artifact is present. Segmentation: Transitional anatomy with sacralized L5 and remained tree L5-S1 disc. Alignment:  Grade 1 anterolisthesis at L3-L4. Vertebrae: Schmorl's node at the superior endplate of L2. Endplate irregularity and marrow edema and sclerosis at L5-S1. Otherwise heterogeneous marrow signal Conus medullaris and cauda equina: Conus extends to the L1 level. Conus and cauda equina appear normal. Paraspinal and other soft tissues: Right psoas edema at L5-S1. Disc levels: L1-L2:  Disc bulge.  Minor canal stenosis.  No foraminal stenosis. L2-L3:  No canal or foraminal stenosis. L3-L4: Anterolisthesis with uncovering of disc bulge. Left greater than right facet arthropathy with ligamentum flavum infolding. There is a left facet joint effusion. Moderate canal stenosis. Partial effacement subarticular recesses. Minor right and mild left foraminal stenosis. L4-L5: Disc edema. Disc bulge with endplate osteophytic ridging. Facet arthropathy with ligamentum flavum infolding. Moderate to marked canal stenosis. Effacement of the right greater than left  subarticular recesses. Mild right and mild to moderate left foraminal stenosis. There is some ill-defined signal in the ventral epidural space above and below disc level. L5-S1:  Transitional level with hypoplastic disc.  No stenosis. IMPRESSION: Endplate irregularity and marrow edema with disc edema at L5-S1. Mild right psoas edema. Some ill-defined signal in the ventral epidural space above and below this disc level. This may be degenerative in etiology or reflect discitis/osteomyelitis. Degenerative changes result in moderate to marked canal stenosis at L4-L5 with narrowing of subarticular recesses. Electronically Signed   By: Macy Mis M.D.   On: 12/18/2021 17:15   ECHOCARDIOGRAM COMPLETE  Result Date: 12/18/2021    ECHOCARDIOGRAM REPORT   Patient Name:   Diamond [name] Date of Exam: 12/18/2021 Medical Rec #:  [id number]      Height:       67.0 in Accession #:    [id number]     Weight:       152.0 lb Date of Birth:  Jan 23, 1951     BSA:          1.800 m Patient Age:    70 years       BP:           138/83 mmHg Patient Gender: F              HR:           90 bpm. Exam Location:  Inpatient Procedure: 2D Echo Indications:    Bacteremia  History:        Patient has prior history of Echocardiogram examinations, most                 recent 11/06/2019. Risk Factors:Hypertension. Dysrhythmia.  Sonographer:    Arlyss Gandy Referring Phys: [place]  1. Left ventricular ejection fraction, by estimation, is 60 to 65%. The left ventricle has normal function. The left ventricle has no regional wall motion abnormalities. There is moderate hypertrophy of the basal septum. The rest of the LV segments demonstrate mild left ventricular hypertrophy. Left ventricular diastolic parameters are consistent with Grade I diastolic dysfunction (impaired relaxation).  2. Right ventricular systolic function is normal. The right ventricular size is normal. There is normal pulmonary artery systolic pressure.   3. The mitral valve leaflets are thickened. There are fibrinous densities visualized on the anterior mitral valve leaflet on the apical 3C view. Given underlying bacteremia, findings concerning for vegetation formation (versus degenerative valve changes). There is trivial mitral regurgitation and no evidence of valve destruction. Recommend TEE for further evaluation.  4. The aortic valve is tricuspid. There is mild calcification of the aortic valve. There is moderate thickening of the aortic valve. Aortic valve regurgitation is trivial. Aortic valve sclerosis/calcification is present, without any evidence of aortic stenosis.  5. Aortic dilatation noted. There is mild dilatation of the ascending aorta, measuring 40 mm.  6. There is a highly mobile, fibrinous structure visualized in the RA most likely a eustachian valve or chiari network. Comparison(s): Compared to prior TTE in 11/2019, the mitral valve leaflets continue to be thickened, however, there is concern for possible vegetation on current study. Otherwise, the TR appears mild (previously mod-to-severe). Conclusion(s)/Recommendation(s): Findings concerning for mitral valve vegetation, would recommend a Transesophageal Echocardiogram for clarification. FINDINGS  Left Ventricle: Left ventricular ejection fraction, by estimation, is 60 to 65%. The left ventricle has normal function. The left ventricle has no regional wall motion abnormalities. The left ventricular internal cavity size was normal in size. There is  moderate hypertrophy of the basal septum. The rest of the LV segments demonstrate mild left ventricular hypertrophy. Left ventricular diastolic parameters are consistent with Grade I diastolic dysfunction (impaired relaxation). Right Ventricle: The right ventricular size is normal. No increase in right ventricular wall thickness. Right ventricular systolic function is normal. There is normal pulmonary artery systolic pressure. The tricuspid regurgitant  velocity is 2.84 m/s, and  with an assumed right atrial pressure of 3 mmHg, the estimated right ventricular systolic pressure is 70.6 mmHg. Left Atrium: Left atrial size was normal in size. Right Atrium: There is a highly mobile, fibrinous structure visualized in the RA most likely a eustachian valve or chiari network. Right atrial size was normal in size. Pericardium: There is no evidence of pericardial effusion. Mitral Valve: The mitral valve leaflets are thickened. There are fibrinous densities visualized on the anterior mitral valve leaflet on the apical 3C view. Given underlying bacteremia, findings concerning for vegetation formation (versus degenerative valve changes).  There is trivial mitral regurgitation and no evidence of valve destruction. Recommend TEE for further evaluation. The mitral valve is abnormal. There is moderate thickening of the mitral valve leaflet(s). Mild mitral annular calcification. Trivial mitral valve regurgitation. Tricuspid Valve: The tricuspid valve is normal in structure. Tricuspid valve regurgitation is mild. Aortic Valve: The aortic valve is tricuspid. There is mild calcification of the aortic valve. There is moderate thickening of the aortic valve. Aortic valve regurgitation is trivial. Aortic valve sclerosis/calcification is present, without any evidence of aortic stenosis. Aortic valve mean gradient measures 7.0 mmHg. Aortic valve peak gradient measures 12.7 mmHg. Aortic valve area, by VTI measures 3.19 cm. Pulmonic Valve: The pulmonic valve was normal in structure. Pulmonic valve regurgitation is trivial. Aorta: Aortic dilatation noted. There is mild dilatation of the ascending aorta, measuring 40 mm. IAS/Shunts: No atrial level shunt detected by color flow Doppler.  LEFT VENTRICLE PLAX 2D LVIDd:         4.00 cm   Diastology LVIDs:         2.80 cm   LV e' medial:    8.10 cm/s LV PW:         1.30 cm   LV E/e' medial:  11.3 LV IVS:        1.30 cm   LV e' lateral:   11.80 cm/s  LVOT diam:     2.20 cm   LV E/e' lateral: 7.7 LV SV:         98 LV SV Index:   54 LVOT Area:     3.80 cm  RIGHT VENTRICLE             IVC RV Basal diam:  3.60 cm     IVC diam: 2.00 cm RV S prime:     16.70 cm/s TAPSE (M-mode): 2.6 cm LEFT ATRIUM             Index        RIGHT ATRIUM           Index LA Vol (A2C):   60.2 ml 33.45 ml/m  RA Area:     14.20 cm LA Vol (A4C):   47.4 ml 26.34 ml/m  RA Volume:   33.30 ml  18.50 ml/m LA Biplane Vol: 54.4 ml 30.23 ml/m  AORTIC VALVE AV Area (Vmax):    2.99 cm AV Area (Vmean):   3.13 cm AV Area (VTI):     3.19 cm AV Vmax:           178.00 cm/s AV Vmean:          117.000 cm/s AV VTI:            0.307 m AV Peak Grad:      12.7 mmHg AV Mean Grad:      7.0 mmHg LVOT Vmax:         140.00 cm/s LVOT Vmean:        96.200 cm/s LVOT VTI:          0.258 m LVOT/AV VTI ratio: 0.84  AORTA Ao Root diam: 3.20 cm Ao Asc diam:  4.00 cm MITRAL VALVE               TRICUSPID VALVE MV Area (PHT): 4.41 cm    TR Peak grad:   32.3 mmHg MV Decel Time: 172 msec    TR Vmax:        284.00 cm/s MV E velocity: 91.20 cm/s MV A velocity: 99.60 cm/s  SHUNTS MV  E/A ratio:  0.92        Systemic VTI:  0.26 m                            Systemic Diam: 2.20 cm Gwyndolyn Kaufman Diamond [name] Electronically signed by Gwyndolyn Kaufman Diamond [name] Signature Date/Time: 12/18/2021/4:24:40 PM    Final    Medications:  vancomycin      Chlorhexidine Gluconate Cloth  6 each Topical Q0600   heparin injection (subcutaneous)  5,000 Units Subcutaneous Q8H   mupirocin ointment  1 application Nasal BID   polyethylene glycol  34 g Oral Daily   senna-docusate  2 tablet Oral BID   verapamil  120 mg Oral Q8H

## 2021-12-21 DIAGNOSIS — R6520 Severe sepsis without septic shock: Secondary | ICD-10-CM | POA: Diagnosis not present

## 2021-12-21 DIAGNOSIS — A419 Sepsis, unspecified organism: Secondary | ICD-10-CM | POA: Diagnosis not present

## 2021-12-21 DIAGNOSIS — M4620 Osteomyelitis of vertebra, site unspecified: Secondary | ICD-10-CM

## 2021-12-21 DIAGNOSIS — N186 End stage renal disease: Secondary | ICD-10-CM

## 2021-12-21 DIAGNOSIS — R7881 Bacteremia: Secondary | ICD-10-CM | POA: Diagnosis not present

## 2021-12-21 LAB — RENAL FUNCTION PANEL
Albumin: 2 g/dL — ABNORMAL LOW (ref 3.5–5.0)
Anion gap: 12 (ref 5–15)
BUN: 55 mg/dL — ABNORMAL HIGH (ref 8–23)
CO2: 23 mmol/L (ref 22–32)
Calcium: 9.2 mg/dL (ref 8.9–10.3)
Chloride: 93 mmol/L — ABNORMAL LOW (ref 98–111)
Creatinine, Ser: 6.58 mg/dL — ABNORMAL HIGH (ref 0.44–1.00)
GFR, Estimated: 6 mL/min — ABNORMAL LOW (ref >60)
Glucose, Bld: 92 mg/dL (ref 70–99)
Phosphorus: 7.1 mg/dL — ABNORMAL HIGH (ref 2.5–4.6)
Potassium: 3.7 mmol/L (ref 3.5–5.1)
Sodium: 128 mmol/L — ABNORMAL LOW (ref 135–145)

## 2021-12-21 MED ORDER — POLYETHYLENE GLYCOL 3350 17 G PO PACK
34.0000 g | PACK | Freq: Once | ORAL | Status: AC
Start: 2021-12-21 — End: 2021-12-21
  Administered 2021-12-21: 34 g via ORAL
  Filled 2021-12-21: qty 2

## 2021-12-21 MED ORDER — SODIUM CHLORIDE 0.9 % IV SOLN: INTRAVENOUS | Status: DC

## 2021-12-21 MED ORDER — BISACODYL 5 MG PO TBEC
10.0000 mg | DELAYED_RELEASE_TABLET | Freq: Once | ORAL | Status: AC
  Administered 2021-12-21: 10 mg via ORAL
  Filled 2021-12-21: qty 2

## 2021-12-21 NOTE — Progress Notes (Signed)
[facility] Progress Note   Subjective: Seen in room. No cp, dyspnea. Uncomfortable in bed, c/o back pain. Asking for pain meds.    Objective Vitals:   12/20/21 2337 12/21/21 0600 12/21/21 0810 12/21/21 1000  BP: 136/86 (!) 141/90 138/82   Pulse: 75     Resp: 20     Temp: 97.9 F (36.6 C)  98 F (36.7 C) (!) 102.1 F (38.9 C)  TempSrc: Axillary  Oral Oral  SpO2: 98%     Weight:      Height:         Additional Objective Labs: Basic Metabolic Panel: Recent Labs  Lab 12/18/21 0736 12/19/21 0630 12/21/21 0645  NA 132* 132* 128*  K 3.5 3.4* 3.7  CL 98 93* 93*  CO2 21* 21* 23  GLUCOSE 93 101* 92  BUN 40* 60* 55*  CREATININE 5.93* 6.98* 6.58*  CALCIUM 8.9 9.5 9.2  PHOS 5.8* 5.9* 7.1*   CBC: Recent Labs  Lab 12/16/21 1450 12/17/21 0554 12/19/21 0644  WBC 20.5* 20.3* 12.2*  NEUTROABS 19.0* 18.3*  --   HGB 12.5 11.3* 10.4*  HCT 39.0 36.0 30.3*  MCV 97.5 100.8* 92.9  PLT 210 153 220   Blood Culture    Component Value Date/Time   SDES BLOOD RIGHT HAND 12/19/2021 0643   SPECREQUEST  12/19/2021 0643    BOTTLES DRAWN AEROBIC ONLY Blood Culture results may not be optimal due to an inadequate volume of blood received in culture bottles   CULT  12/19/2021 0643    NO GROWTH 2 DAYS Performed at [facility], [place] [address] [place], [place] [address]    REPTSTATUS PENDING 12/19/2021 3734     Physical Exam General: Alert, lying in bed, nad  Heart: RRR Lungs: Clear bilaterally Abdomen: soft non-tender  Extremities: No LE edema  Dialysis Access: [facility] removed   Medications:  vancomycin      Chlorhexidine Gluconate Cloth  6 each Topical Q0600   heparin injection (subcutaneous)  5,000 Units Subcutaneous Q8H   mupirocin ointment  1 application Nasal BID   polyethylene glycol  34 g Oral Daily   senna-docusate  2 tablet Oral BID   verapamil  120 mg Oral Q8H    Dialysis Orders:  TTS HD at [facility]  4h  400/600  68.5kg  3K/2 bath  Hep 2000   access ?   - hect 4 ug tiw  - venofer 50 q wk  - no esa, last Hb 11.9  Assessment/Plan: Sepsis/ MRSA bacteremia - 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 As well concern for vertebral osteo.  ID recommended IV vanc and line holiday.  Noted TTE showed MV vegetation, recommended TEE.  Repeat blood Cx 1/4 neg to date. [place] removed by [facility] on 1/14 for line holiday, w/ plans new TDC and possibly AVF, if BC neg on 1/17. Anticipate 8 weeks of antibiotics. ESRD -HD TTS. Last HD on Sat 1/14. Line holiday as above. No urgent dialysis indications today. Plan for next HD Wed 1/18.   HTN/volume -BP acceptable.  Post HD wt 65kg  on 1/14.  Anemia -Hgb 10.4 <11.5 no ESA needs, DC weekly Venofer with sepsis Secondary hyperparathyroidism -Corrected Ca elevated. Phos not at goal.  Continue binders ,hold vitamin D with ^ Ca ,used 2.0 calcium bath  Right-sided pulmonary nodule.  follow-up intervention per primary/pulm Lower back pain.  Possible osteo-/discitis on MRI LS. MRI LS = L5/S1 degenerative changes possibly, but IR said to small to sample, and neurosurg  looked at this and didn't think it was spine infection.  pain management per admit  Lynnda Child PA-C [facility] 12/21/2021,10:35 AM

## 2021-12-21 NOTE — Progress Notes (Signed)
°  Transition of Care [facility]) Screening Note   Patient Details  Name: DASHANTI BURR Date of Birth: 1951-05-12   Transition of Care [facility]) CM/SW Contact:    Benard Halsted, LCSW Phone Number: 12/21/2021, 8:39 AM    Transition of Care Department [facility]) has reviewed patient and no TOC needs have been identified at this time. We will continue to monitor patient advancement through interdisciplinary progression rounds. If new patient transition needs arise, please place a TOC consult.

## 2021-12-21 NOTE — Progress Notes (Signed)
° ° °  [facility] has been requested to perform a transesophageal echocardiogram on Ms. Diamond [name] for Bacteremia.  After careful review of history and examination, the risks and benefits of transesophageal echocardiogram have been explained including risks of esophageal damage, perforation (1:10,000 risk), bleeding, pharyngeal hematoma as well as other potential complications associated with conscious sedation including aspiration, arrhythmia, respiratory failure and death. Alternatives to treatment were discussed, questions were answered. Patient is willing to proceed.  TEE - Dr. Marlou Porch @ 1300. NPO after midnight. Meds with sips.   Leanor Kail, PA-C 12/21/2021 3:43 PM

## 2021-12-21 NOTE — Progress Notes (Signed)
[facility] for Infectious Disease  Date of Admission:  12/16/2021     Principal Problem:   Severe sepsis [facility]) Active Problems:   Hypertension   Anemia of chronic disease   Leukocytosis   Lactic acidosis   Low back pain   ESRD (end stage renal disease) (HCC)   Mild intermittent asthma       Abx: 1/12-c vanc      Assessment: 70 YF with ESRD on HD via right IJ tunneled catheter admitted for sepsis 2/2 suspect UTI. Urine Cx showed no growth. Found to have MRSA bacteremia.  #MRSA bacteremia #L5-S1 osteomyelitis #ESRD on HD via RIJ SP removal on 1/14 -1/11 blood Cx+ MRSA, 1/13 blood Cx NGTD -TTE did not show valvular vegetation -MRI showed concern for L5-S1 discitis/osteomyelitis -- no obvious abscess, but mentioned also mild right psoas edema -Pt had RIJ TDC removed on 1/14 and plan for line holiday till 1/17 -Source of bacteremia unclear but strong suspicion for endocarditis given MRSA bacteremia in an hemodialysis pt via IJ and vertebral osteomyelitis(likely septic emboli).  ##Rt sided pulmonary nodule on CT  in 2011 -Follow-up imaging per primary/pulm   Recommendations: -continue vancomycin; anticipate at least 8 weeks antibiotics in setting of vertebral infection. -await tee scheduled 1/17 -discussed with primary team      Microbiology:     SUBJECTIVE: Back pain stable Await tee No f/c No n/v/diarrhea No rash No other joint pain  Review of Systems: Review of Systems  All other systems reviewed and are negative.   Scheduled Meds:  bisacodyl  10 mg Oral Once   Chlorhexidine Gluconate Cloth  6 each Topical Q0600   heparin injection (subcutaneous)  5,000 Units Subcutaneous Q8H   mupirocin ointment  1 application Nasal BID   polyethylene glycol  34 g Oral Daily   polyethylene glycol  34 g Oral Once   senna-docusate  2 tablet Oral BID   verapamil  120 mg Oral Q8H   Continuous Infusions:  vancomycin     PRN Meds:.acetaminophen  **OR** acetaminophen, albuterol, naLOXone (NARCAN)  injection, oxyCODONE No Known Allergies  OBJECTIVE: Vitals:   12/21/21 0600 12/21/21 0810 12/21/21 1000 12/21/21 1155  BP: (!) 141/90 138/82  (!) 125/91  Pulse:      Resp:      Temp:  98 F (36.7 C) (!) 102.1 F (38.9 C) 99.1 F (37.3 C)  TempSrc:  Oral Oral Oral  SpO2:      Weight:      Height:       Body mass index is 22.27 kg/m.  Physical exam: General/constitutional: no distress, pleasant HEENT: Normocephalic, PER, Conj Clear, EOMI, Oropharynx clear Neck supple CV: rrr no mrg Lungs: clear to auscultation, normal respiratory effort Abd: Soft, Nontender Ext: no edema Skin: No Rash Neuro: nonfocal MSK: no peripheral joint swelling/tenderness/warmth   Central line presence: no    Lab Results Lab Results  Component Value Date   WBC 12.2 (H) 12/19/2021   HGB 10.4 (L) 12/19/2021   HCT 30.3 (L) 12/19/2021   MCV 92.9 12/19/2021   PLT 220 12/19/2021    Lab Results  Component Value Date   CREATININE 6.58 (H) 12/21/2021   BUN 55 (H) 12/21/2021   NA 128 (L) 12/21/2021   K 3.7 12/21/2021   CL 93 (L) 12/21/2021   CO2 23 12/21/2021    Lab Results  Component Value Date   ALT 8 12/17/2021   AST 11 (L) 12/17/2021  ALKPHOS 80 12/17/2021   BILITOT 1.1 12/17/2021     Imaging: I reviewed imaging and incorporate finding into assessment/plan   1/13 lumbar and thoracic mri non-con Endplate irregularity and marrow edema with disc edema at L5-S1. Mild right psoas edema. Some ill-defined signal in the ventral epidural space above and below this disc level. This may be degenerative in etiology or reflect discitis/osteomyelitis.   Degenerative changes result in moderate to marked canal stenosis at L4-L5 with narrowing of subarticular recesses.  Jabier Mutton, [place] [facility] 12/21/2021, 3:14 PM

## 2021-12-21 NOTE — Progress Notes (Signed)
[facility] [place] PROGRESS NOTE  ASSESSMENT / PLAN: Diamond [name] is a 71 y.o. female with ESRD in need of dialysis access; bacteremia / endocarditis. If blood cultures negative, will plan to place left arm arteriovenous fistula and tunneled dialysis catheter on Wednesday 12/23/21.    SUBJECTIVE: No complaints.   OBJECTIVE: BP 138/82 (BP Location: Right Arm)    Pulse 75    Temp (!) 102.1 F (38.9 C) (Oral)    Resp 20    Ht 5\' 7"  (1.702 m)    Wt 64.5 kg    SpO2 98%    BMI 22.27 kg/m   No distress TDC site healing appropriately.  CBC Latest Ref Rng & Units 12/19/2021 12/17/2021 12/16/2021  WBC 4.0 - 10.5 K/uL 12.2(H) 20.3(H) 20.5(H)  Hemoglobin 12.0 - 15.0 g/dL 10.4(L) 11.3(L) 12.5  Hematocrit 36.0 - 46.0 % 30.3(L) 36.0 39.0  Platelets 150 - 400 K/uL 220 153 210     CMP Latest Ref Rng & Units 12/21/2021 12/19/2021 12/18/2021  Glucose 70 - 99 mg/dL 92 101(H) 93  BUN 8 - 23 mg/dL 55(H) 60(H) 40(H)  Creatinine 0.44 - 1.00 mg/dL 6.58(H) 6.98(H) 5.93(H)  Sodium 135 - 145 mmol/L 128(L) 132(L) 132(L)  Potassium 3.5 - 5.1 mmol/L 3.7 3.4(L) 3.5  Chloride 98 - 111 mmol/L 93(L) 93(L) 98  CO2 22 - 32 mmol/L 23 21(L) 21(L)  Calcium 8.9 - 10.3 mg/dL 9.2 9.5 8.9  Total Protein 6.5 - 8.1 g/dL - - -  Total Bilirubin 0.3 - 1.2 mg/dL - - -  Alkaline Phos 38 - 126 U/L - - -  AST 15 - 41 U/L - - -  ALT 0 - 44 U/L - - -    Estimated Creatinine Clearance: 7.5 mL/min (A) (by C-G formula based on SCr of 6.58 mg/dL (H)).  Yevonne Aline. Stanford Breed, MD [facility] Phone Number: [phone] 12/21/2021 10:50 AM

## 2021-12-21 NOTE — Progress Notes (Signed)
PROGRESS NOTE    Diamond [name]  AOZ:[id number] DOB: 31-Jul-1951 DOA: 12/16/2021 PCP: [name], MD   Chief Complaint  Patient presents with   Back Pain    Brief Narrative:   Diamond [name] is a 71 y.o. female with medical history significant for incisional disease on hemodialysis on Tuesday, Thursday, Saturday schedule, mild intermittent asthma, hypertension, anemia of chronic kidney disease, who is admitted to [facility] on 12/16/2021 with severe sepsis after presenting from home to [facility] ED complaining of right-sided back pain.  Her work-up was significant for sepsis, blood pressure came back growing MRSA.  Will her 2D echo suspicious for endocarditis, and MRI lower back significant for discitis/osteomyelitis.      Assessment & Plan:   Principal Problem:   Severe sepsis ([place]) Active Problems:   Hypertension   Anemia of chronic disease   Leukocytosis   Lactic acidosis   Low back pain   ESRD (end stage renal disease) (HCC)   Mild intermittent asthma   Sepsis, present on admission, due to bacteremia/discitis/possible endocarditis -Present on admission, leukocytosis 20 K, tachycardic 109, tachypneic 26, with elevated lactic acid of 2.7. -Blood culture showing MRSA , currently on IV vancomycin -As well possible urinary source as evidence of cystitis on imaging. -2D echo significant for possible mitral valve vegetation, will need TEE, plan for Tuesday -Lumbar/thoracic spine MRI significant for discitis/osteomyelitis will need prolonged IV antibiotic therapy -Will need line holiday, vascular surgery consulted,  tunneled cath removed 1/14 by vascular surgery, plan likely for AV fistula next Wednesday if cultures remain negative.  -Antibiotics management per ID, likely will need prolonged course of 8 weeks, pending findings on TEE and final results of blood cultures antibiotics management per ID   -Once blood cultures 1/14 remain negative.  End-stage renal disease:   - On hemodialysis on Tuesday, Thursday, Saturday schedule.  Renal consulted -Next dialysis possibly on Wednesday as she is currently on line holiday.  Done for AV fistula by vascular surgery on 1/18.     Mild intermittent asthma:  -  Continue home prn albuterol inhaler.  Add on serum magnesium level.   Anemia of chronic kidney disease:  -Procrit and IV iron per renal      Hypertension:  -Continue with verapamil, holding hydrochlorothiazide, will add as needed hydralazine.         DVT prophylaxis: Heparin Code Status: Full Family Communication: Discussed with daughter at bedside. Disposition:   Status is: Inpatient  The patient will require care spanning > 2 midnights and should be moved to inpatient because: Now she is with a gram-positive cocci bacteremia, in need of IV antibiotic and further work-up.      Consultants:  Renal ID  vascular   Subjective:  She still reports constipation, reports lower back pain but overall has improved, but exacerbated by movement, she had fever 102 this morning. Objective: Vitals:   12/21/21 0600 12/21/21 0810 12/21/21 1000 12/21/21 1155  BP: (!) 141/90 138/82  (!) 125/91  Pulse:      Resp:      Temp:  98 F (36.7 C) (!) 102.1 F (38.9 C) 99.1 F (37.3 C)  TempSrc:  Oral Oral Oral  SpO2:      Weight:      Height:       No intake or output data in the 24 hours ending 12/21/21 1226  Filed Weights   12/19/21 0750 12/19/21 1052 12/20/21 0451  Weight: 66.7 kg 65 kg 64.5 kg  Examination:  Awake Alert, Oriented X 3, No new F.N deficits, Normal affect Symmetrical Chest wall movement, Good air movement bilaterally, CTAB RRR,No Gallops,Rubs or new Murmurs, No Parasternal Heave +ve B.Sounds, Abd Soft, No tenderness, No rebound - guarding or rigidity. No Cyanosis, Clubbing or edema, No new Rash or bruise     Data Reviewed: I have personally reviewed following labs and imaging studies  CBC: Recent Labs  Lab  12/16/21 1450 12/17/21 0554 12/19/21 0644  WBC 20.5* 20.3* 12.2*  NEUTROABS 19.0* 18.3*  --   HGB 12.5 11.3* 10.4*  HCT 39.0 36.0 30.3*  MCV 97.5 100.8* 92.9  PLT 210 153 354    Basic Metabolic Panel: Recent Labs  Lab 12/16/21 1450 12/17/21 0554 12/18/21 0736 12/19/21 0630 12/21/21 0645  NA 130* 133* 132* 132* 128*  K 4.5 5.4* 3.5 3.4* 3.7  CL 97* 94* 98 93* 93*  CO2 15* 14* 21* 21* 23  GLUCOSE 102* 82 93 101* 92  BUN 73* 80* 40* 60* 55*  CREATININE 9.73* 9.58* 5.93* 6.98* 6.58*  CALCIUM 9.9 8.9 8.9 9.5 9.2  MG  --  1.9  --   --   --   PHOS  --  7.4* 5.8* 5.9* 7.1*    GFR: Estimated Creatinine Clearance: 7.5 mL/min (A) (by C-G formula based on SCr of 6.58 mg/dL (H)).  Liver Function Tests: Recent Labs  Lab 12/16/21 1450 12/17/21 0554 12/17/21 1251 12/17/21 1437 12/18/21 0736 12/19/21 0630 12/21/21 0645  AST 15 16 18  11*  --   --   --   ALT 10 8 7 8   --   --   --   ALKPHOS 71 92 67 80  --   --   --   BILITOT 1.1 1.3* 1.6* 1.1  --   --   --   PROT 7.2 6.3* 5.5* 6.7  --   --   --   ALBUMIN 3.0* 2.5* 2.4* 2.5* 2.2* 2.0* 2.0*    CBG: No results for input(s): GLUCAP in the last 168 hours.   Recent Results (from the past 240 hour(s))  Resp Panel by RT-PCR (Flu A&B, Covid) Nasopharyngeal Swab     Status: None   Collection Time: 12/16/21  6:02 PM   Specimen: Nasopharyngeal Swab; Nasopharyngeal(NP) swabs in vial transport medium  Result Value Ref Range Status   SARS Coronavirus 2 by RT PCR NEGATIVE NEGATIVE Final    Comment: (NOTE) SARS-CoV-2 target nucleic acids are NOT DETECTED.  The SARS-CoV-2 RNA is generally detectable in upper respiratory specimens during the acute phase of infection. The lowest concentration of SARS-CoV-2 viral copies this assay can detect is 138 copies/mL. A negative result does not preclude SARS-Cov-2 infection and should not be used as the sole basis for treatment or other patient management decisions. A negative result may occur  with  improper specimen collection/handling, submission of specimen other than nasopharyngeal swab, presence of viral mutation(s) within the areas targeted by this assay, and inadequate number of viral copies(<138 copies/mL). A negative result must be combined with clinical observations, patient history, and epidemiological information. The expected result is Negative.  Fact Sheet for Patients:  EntrepreneurPulse.com.au  Fact Sheet for Healthcare Providers:  IncredibleEmployment.be  This test is no t yet approved or cleared by the [place] FDA and  has been authorized for detection and/or diagnosis of SARS-CoV-2 by FDA under an Emergency Use Authorization (EUA). This EUA will remain  in effect (meaning this test can be used) for the duration  of the COVID-19 declaration under Section 564(b)(1) of the Act, 21 U.S.C.section 360bbb-3(b)(1), unless the authorization is terminated  or revoked sooner.       Influenza A by PCR NEGATIVE NEGATIVE Final   Influenza B by PCR NEGATIVE NEGATIVE Final    Comment: (NOTE) The Xpert Xpress SARS-CoV-2/FLU/RSV plus assay is intended as an aid in the diagnosis of influenza from Nasopharyngeal swab specimens and should not be used as a sole basis for treatment. Nasal washings and aspirates are unacceptable for Xpert Xpress SARS-CoV-2/FLU/RSV testing.  Fact Sheet for Patients: EntrepreneurPulse.com.au  Fact Sheet for Healthcare Providers: IncredibleEmployment.be  This test is not yet approved or cleared by the [place] FDA and has been authorized for detection and/or diagnosis of SARS-CoV-2 by FDA under an Emergency Use Authorization (EUA). This EUA will remain in effect (meaning this test can be used) for the duration of the COVID-19 declaration under Section 564(b)(1) of the Act, 21 U.S.C. section 360bbb-3(b)(1), unless the authorization is terminated  or revoked.  Performed at [facility], [place] [address] [place], [place] [address]   Urine Culture     Status: None   Collection Time: 12/16/21 11:49 PM   Specimen: Urine, Clean Catch  Result Value Ref Range Status   Specimen Description URINE, CLEAN CATCH  Final   Special Requests NONE  Final   Culture   Final    NO GROWTH Performed at [facility], [place] [address], [place], [place] [address]    Report Status 12/17/2021 FINAL  Final  Culture, blood (routine x 2)     Status: Abnormal   Collection Time: 12/17/21  1:14 AM   Specimen: BLOOD LEFT HAND  Result Value Ref Range Status   Specimen Description BLOOD LEFT HAND  Final   Special Requests   Final    BOTTLES DRAWN AEROBIC AND ANAEROBIC Blood Culture adequate volume   Culture  Setup Time   Final    GRAM POSITIVE COCCI IN CLUSTERS IN BOTH AEROBIC AND ANAEROBIC BOTTLES CRITICAL VALUE NOTED.  VALUE IS CONSISTENT WITH PREVIOUSLY REPORTED AND CALLED VALUE.    Culture (A)  Final    STAPHYLOCOCCUS AUREUS SUSCEPTIBILITIES PERFORMED ON PREVIOUS CULTURE WITHIN THE LAST 5 DAYS. Performed at [facility], [place] [address], [place], [place] [address]    Report Status 12/19/2021 FINAL  Final  Culture, blood (routine x 2)     Status: Abnormal   Collection Time: 12/17/21  1:19 AM   Specimen: BLOOD  Result Value Ref Range Status   Specimen Description BLOOD SITE NOT SPECIFIED  Final   Special Requests   Final    BOTTLES DRAWN AEROBIC AND ANAEROBIC Blood Culture results may not be optimal due to an inadequate volume of blood received in culture bottles   Culture  Setup Time   Final    GRAM POSITIVE COCCI IN CLUSTERS IN BOTH AEROBIC AND ANAEROBIC BOTTLES CRITICAL RESULT CALLED TO, READ BACK BY AND VERIFIED WITHLoletha Grayer AMEND PHARMD [id number] 12/17/21 A BROWNING Performed at [facility], [place] [address] [place], [place] [address]    Culture METHICILLIN RESISTANT STAPHYLOCOCCUS AUREUS (A)  Final   Report Status 12/19/2021  FINAL  Final   Organism ID, Bacteria METHICILLIN RESISTANT STAPHYLOCOCCUS AUREUS  Final      Susceptibility   Methicillin resistant staphylococcus aureus - MIC*    CIPROFLOXACIN <=0.5 SENSITIVE Sensitive     ERYTHROMYCIN <=0.25 SENSITIVE Sensitive     GENTAMICIN <=0.5 SENSITIVE Sensitive  OXACILLIN RESISTANT Resistant     TETRACYCLINE >=16 RESISTANT Resistant     VANCOMYCIN <=0.5 SENSITIVE Sensitive     TRIMETH/SULFA <=10 SENSITIVE Sensitive     CLINDAMYCIN <=0.25 SENSITIVE Sensitive     RIFAMPIN <=0.5 SENSITIVE Sensitive     Inducible Clindamycin NEGATIVE Sensitive     * METHICILLIN RESISTANT STAPHYLOCOCCUS AUREUS  Blood Culture ID Panel (Reflexed)     Status: Abnormal   Collection Time: 12/17/21  1:19 AM  Result Value Ref Range Status   Enterococcus faecalis NOT DETECTED NOT DETECTED Final   Enterococcus Faecium NOT DETECTED NOT DETECTED Final   Listeria monocytogenes NOT DETECTED NOT DETECTED Final   Staphylococcus species DETECTED (A) NOT DETECTED Final    Comment: CRITICAL RESULT CALLED TO, READ BACK BY AND VERIFIED WITH: C AMEND PHARMD [id number] 12/17/21 A BROWNING    Staphylococcus aureus (BCID) DETECTED (A) NOT DETECTED Final    Comment: Methicillin (oxacillin)-resistant Staphylococcus aureus (MRSA). MRSA is predictably resistant to beta-lactam antibiotics (except ceftaroline). Preferred therapy is vancomycin unless clinically contraindicated. Patient requires contact precautions if  hospitalized. CRITICAL RESULT CALLED TO, READ BACK BY AND VERIFIED WITH: C AMEND PHARMD [id number] 12/17/21 A BROWNING    Staphylococcus epidermidis NOT DETECTED NOT DETECTED Final   Staphylococcus lugdunensis NOT DETECTED NOT DETECTED Final   Streptococcus species NOT DETECTED NOT DETECTED Final   Streptococcus agalactiae NOT DETECTED NOT DETECTED Final   Streptococcus pneumoniae NOT DETECTED NOT DETECTED Final   Streptococcus pyogenes NOT DETECTED NOT DETECTED Final   A.calcoaceticus-baumannii NOT  DETECTED NOT DETECTED Final   Bacteroides fragilis NOT DETECTED NOT DETECTED Final   Enterobacterales NOT DETECTED NOT DETECTED Final   Enterobacter cloacae complex NOT DETECTED NOT DETECTED Final   Escherichia coli NOT DETECTED NOT DETECTED Final   Klebsiella aerogenes NOT DETECTED NOT DETECTED Final   Klebsiella oxytoca NOT DETECTED NOT DETECTED Final   Klebsiella pneumoniae NOT DETECTED NOT DETECTED Final   Proteus species NOT DETECTED NOT DETECTED Final   Salmonella species NOT DETECTED NOT DETECTED Final   Serratia marcescens NOT DETECTED NOT DETECTED Final   Haemophilus influenzae NOT DETECTED NOT DETECTED Final   Neisseria meningitidis NOT DETECTED NOT DETECTED Final   Pseudomonas aeruginosa NOT DETECTED NOT DETECTED Final   Stenotrophomonas maltophilia NOT DETECTED NOT DETECTED Final   Candida albicans NOT DETECTED NOT DETECTED Final   Candida auris NOT DETECTED NOT DETECTED Final   Candida glabrata NOT DETECTED NOT DETECTED Final   Candida krusei NOT DETECTED NOT DETECTED Final   Candida parapsilosis NOT DETECTED NOT DETECTED Final   Candida tropicalis NOT DETECTED NOT DETECTED Final   Cryptococcus neoformans/gattii NOT DETECTED NOT DETECTED Final   Meth resistant mecA/C and MREJ DETECTED (A) NOT DETECTED Final    Comment: CRITICAL RESULT CALLED TO, READ BACK BY AND VERIFIED WITHLoletha Grayer AMEND PHARMD 1613 12/17/21 A BROWNING Performed at [facility], [address] [place], [place] [address]   MRSA Next Gen by PCR, Nasal     Status: Abnormal   Collection Time: 12/19/21 12:40 AM   Specimen: Nasal Mucosa; Nasal Swab  Result Value Ref Range Status   MRSA by PCR Next Gen DETECTED (A) NOT DETECTED Final    Comment: RESULT CALLED TO, READ BACK BY AND VERIFIED WITH: RN M.ENDISON 12/19/21@5 :45 BY TW (NOTE) The GeneXpert MRSA Assay (FDA approved for NASAL specimens only), is one component of a comprehensive MRSA colonization surveillance program. It is not intended to diagnose  MRSA infection nor to guide or  monitor treatment for MRSA infections. Test performance is not FDA approved in patients less than 108 years old. Performed at [facility], [place] [address], [place], [place] [address]   Culture, blood (routine x 2)     Status: None (Preliminary result)   Collection Time: 12/19/21  6:32 AM   Specimen: BLOOD  Result Value Ref Range Status   Specimen Description BLOOD LEFT ANTECUBITAL  Final   Special Requests   Final    BOTTLES DRAWN AEROBIC ONLY Blood Culture results may not be optimal due to an inadequate volume of blood received in culture bottles   Culture   Final    NO GROWTH 2 DAYS Performed at [facility], [place] [address] [place], [place] [address]    Report Status PENDING  Incomplete  Culture, blood (routine x 2)     Status: None (Preliminary result)   Collection Time: 12/19/21  6:43 AM   Specimen: BLOOD RIGHT HAND  Result Value Ref Range Status   Specimen Description BLOOD RIGHT HAND  Final   Special Requests   Final    BOTTLES DRAWN AEROBIC ONLY Blood Culture results may not be optimal due to an inadequate volume of blood received in culture bottles   Culture   Final    NO GROWTH 2 DAYS Performed at [facility], [place] [address] [place],  [address]    Report Status PENDING  Incomplete         Radiology Studies: No results found.      Scheduled Meds:  Chlorhexidine Gluconate Cloth  6 each Topical Q0600   heparin injection (subcutaneous)  5,000 Units Subcutaneous Q8H   mupirocin ointment  1 application Nasal BID   polyethylene glycol  34 g Oral Daily   senna-docusate  2 tablet Oral BID   verapamil  120 mg Oral Q8H   Continuous Infusions:  vancomycin       LOS: 4 days       Phillips Climes, MD [facility]   To contact the attending provider between 7A-7P or the covering provider during after hours 7P-7A, please log into the web site [email or web address] and access using universal [place]  password for that web site. If you do not have the password, please call the hospital operator.  12/21/2021, 12:26 PM   Patient ID: Diamond [name], female   DOB: February 15, 1951, 71 y.o.   MRN: [id number] Patient ID: Diamond [name], female   DOB: [birth year], 71 y.o.   MRN: [id number] Patient ID: Diamond [name], female   DOB: 03-Nov-1951, 71 y.o.   MRN: [id number] Patient ID: Diamond [name], female   DOB: 1951-07-27, 71 y.o.   MRN: [id number] Patient ID: Diamond [name], female   DOB: 08-17-1951, 71 y.o.   MRN: [id number]

## 2021-12-21 NOTE — H&P (View-Only) (Signed)
PROGRESS NOTE    Diamond [name]  ZOX:[id number] DOB: [birth year] DOA: 12/16/2021 PCP: [name], Diamond [name]   Chief Complaint  Patient presents with   Back Pain    Brief Narrative:   Diamond [name] is a 71 y.o. female with medical history significant for incisional disease on hemodialysis on Tuesday, Thursday, Saturday schedule, mild intermittent asthma, hypertension, anemia of chronic kidney disease, who is admitted to [facility] on 12/16/2021 with severe sepsis after presenting from home to [facility] ED complaining of right-sided back pain.  Her work-up was significant for sepsis, blood pressure came back growing MRSA.  Will her 2D echo suspicious for endocarditis, and MRI lower back significant for discitis/osteomyelitis.      Assessment & Plan:   Principal Problem:   Severe sepsis ([place]) Active Problems:   Hypertension   Anemia of chronic disease   Leukocytosis   Lactic acidosis   Low back pain   ESRD (end stage renal disease) (HCC)   Mild intermittent asthma   Sepsis, present on admission, due to bacteremia/discitis/possible endocarditis -Present on admission, leukocytosis 20 K, tachycardic 109, tachypneic 26, with elevated lactic acid of 2.7. -Blood culture showing MRSA , currently on IV vancomycin -As well possible urinary source as evidence of cystitis on imaging. -2D echo significant for possible mitral valve vegetation, will need TEE, plan for Tuesday -Lumbar/thoracic spine MRI significant for discitis/osteomyelitis will need prolonged IV antibiotic therapy -Will need line holiday, vascular surgery consulted,  tunneled cath removed 1/14 by vascular surgery, plan likely for AV fistula next Wednesday if cultures remain negative.  -Antibiotics management per ID, likely will need prolonged course of 8 weeks, pending findings on TEE and final results of blood cultures antibiotics management per ID   -Once blood cultures 1/14 remain negative.  End-stage renal disease:   - On hemodialysis on Tuesday, Thursday, Saturday schedule.  Renal consulted -Next dialysis possibly on Wednesday as she is currently on line holiday.  Done for AV fistula by vascular surgery on 1/18.     Mild intermittent asthma:  -  Continue home prn albuterol inhaler.  Add on serum magnesium level.   Anemia of chronic kidney disease:  -Procrit and IV iron per renal      Hypertension:  -Continue with verapamil, holding hydrochlorothiazide, will add as needed hydralazine.         DVT prophylaxis: Heparin Code Status: Full Family Communication: Discussed with daughter at bedside. Disposition:   Status is: Inpatient  The patient will require care spanning > 2 midnights and should be moved to inpatient because: Now she is with a gram-positive cocci bacteremia, in need of IV antibiotic and further work-up.      Consultants:  Renal ID  vascular   Subjective:  She still reports constipation, reports lower back pain but overall has improved, but exacerbated by movement, she had fever 102 this morning. Objective: Vitals:   12/21/21 0600 12/21/21 0810 12/21/21 1000 12/21/21 1155  BP: (!) 141/90 138/82  (!) 125/91  Pulse:      Resp:      Temp:  98 F (36.7 C) (!) 102.1 F (38.9 C) 99.1 F (37.3 C)  TempSrc:  Oral Oral Oral  SpO2:      Weight:      Height:       No intake or output data in the 24 hours ending 12/21/21 1226  Filed Weights   12/19/21 0750 12/19/21 1052 12/20/21 0451  Weight: 66.7 kg 65 kg 64.5 kg  Examination:  Awake Alert, Oriented X 3, No new F.N deficits, Normal affect Symmetrical Chest wall movement, Good air movement bilaterally, CTAB RRR,No Gallops,Rubs or new Murmurs, No Parasternal Heave +ve B.Sounds, Abd Soft, No tenderness, No rebound - guarding or rigidity. No Cyanosis, Clubbing or edema, No new Rash or bruise     Data Reviewed: I have personally reviewed following labs and imaging studies  CBC: Recent Labs  Lab  12/16/21 1450 12/17/21 0554 12/19/21 0644  WBC 20.5* 20.3* 12.2*  NEUTROABS 19.0* 18.3*  --   HGB 12.5 11.3* 10.4*  HCT 39.0 36.0 30.3*  MCV 97.5 100.8* 92.9  PLT 210 153 027    Basic Metabolic Panel: Recent Labs  Lab 12/16/21 1450 12/17/21 0554 12/18/21 0736 12/19/21 0630 12/21/21 0645  NA 130* 133* 132* 132* 128*  K 4.5 5.4* 3.5 3.4* 3.7  CL 97* 94* 98 93* 93*  CO2 15* 14* 21* 21* 23  GLUCOSE 102* 82 93 101* 92  BUN 73* 80* 40* 60* 55*  CREATININE 9.73* 9.58* 5.93* 6.98* 6.58*  CALCIUM 9.9 8.9 8.9 9.5 9.2  MG  --  1.9  --   --   --   PHOS  --  7.4* 5.8* 5.9* 7.1*    GFR: Estimated Creatinine Clearance: 7.5 mL/min (A) (by C-G formula based on SCr of 6.58 mg/dL (H)).  Liver Function Tests: Recent Labs  Lab 12/16/21 1450 12/17/21 0554 12/17/21 1251 12/17/21 1437 12/18/21 0736 12/19/21 0630 12/21/21 0645  AST 15 16 18  11*  --   --   --   ALT 10 8 7 8   --   --   --   ALKPHOS 71 92 67 80  --   --   --   BILITOT 1.1 1.3* 1.6* 1.1  --   --   --   PROT 7.2 6.3* 5.5* 6.7  --   --   --   ALBUMIN 3.0* 2.5* 2.4* 2.5* 2.2* 2.0* 2.0*    CBG: No results for input(s): GLUCAP in the last 168 hours.   Recent Results (from the past 240 hour(s))  Resp Panel by RT-PCR (Flu A&B, Covid) Nasopharyngeal Swab     Status: None   Collection Time: 12/16/21  6:02 PM   Specimen: Nasopharyngeal Swab; Nasopharyngeal(NP) swabs in vial transport medium  Result Value Ref Range Status   SARS Coronavirus 2 by RT PCR NEGATIVE NEGATIVE Final    Comment: (NOTE) SARS-CoV-2 target nucleic acids are NOT DETECTED.  The SARS-CoV-2 RNA is generally detectable in upper respiratory specimens during the acute phase of infection. The lowest concentration of SARS-CoV-2 viral copies this assay can detect is 138 copies/mL. A negative result does not preclude SARS-Cov-2 infection and should not be used as the sole basis for treatment or other patient management decisions. A negative result may occur  with  improper specimen collection/handling, submission of specimen other than nasopharyngeal swab, presence of viral mutation(s) within the areas targeted by this assay, and inadequate number of viral copies(<138 copies/mL). A negative result must be combined with clinical observations, patient history, and epidemiological information. The expected result is Negative.  Fact Sheet for Patients:  EntrepreneurPulse.com.au  Fact Sheet for Healthcare Providers:  IncredibleEmployment.be  This test is no t yet approved or cleared by the [place] FDA and  has been authorized for detection and/or diagnosis of SARS-CoV-2 by FDA under an Emergency Use Authorization (EUA). This EUA will remain  in effect (meaning this test can be used) for the duration  of the COVID-19 declaration under Section 564(b)(1) of the Act, 21 U.S.C.section 360bbb-3(b)(1), unless the authorization is terminated  or revoked sooner.       Influenza A by PCR NEGATIVE NEGATIVE Final   Influenza B by PCR NEGATIVE NEGATIVE Final    Comment: (NOTE) The Xpert Xpress SARS-CoV-2/FLU/RSV plus assay is intended as an aid in the diagnosis of influenza from Nasopharyngeal swab specimens and should not be used as a sole basis for treatment. Nasal washings and aspirates are unacceptable for Xpert Xpress SARS-CoV-2/FLU/RSV testing.  Fact Sheet for Patients: EntrepreneurPulse.com.au  Fact Sheet for Healthcare Providers: IncredibleEmployment.be  This test is not yet approved or cleared by the [place] FDA and has been authorized for detection and/or diagnosis of SARS-CoV-2 by FDA under an Emergency Use Authorization (EUA). This EUA will remain in effect (meaning this test can be used) for the duration of the COVID-19 declaration under Section 564(b)(1) of the Act, 21 U.S.C. section 360bbb-3(b)(1), unless the authorization is terminated  or revoked.  Performed at [facility], [place] [address], [place], [place] [address]   Urine Culture     Status: None   Collection Time: 12/16/21 11:49 PM   Specimen: Urine, Clean Catch  Result Value Ref Range Status   Specimen Description URINE, CLEAN CATCH  Final   Special Requests NONE  Final   Culture   Final    NO GROWTH Performed at [facility], [place] [address] [place], [place] [address]    Report Status 12/17/2021 FINAL  Final  Culture, blood (routine x 2)     Status: Abnormal   Collection Time: 12/17/21  1:14 AM   Specimen: BLOOD LEFT HAND  Result Value Ref Range Status   Specimen Description BLOOD LEFT HAND  Final   Special Requests   Final    BOTTLES DRAWN AEROBIC AND ANAEROBIC Blood Culture adequate volume   Culture  Setup Time   Final    GRAM POSITIVE COCCI IN CLUSTERS IN BOTH AEROBIC AND ANAEROBIC BOTTLES CRITICAL VALUE NOTED.  VALUE IS CONSISTENT WITH PREVIOUSLY REPORTED AND CALLED VALUE.    Culture (A)  Final    STAPHYLOCOCCUS AUREUS SUSCEPTIBILITIES PERFORMED ON PREVIOUS CULTURE WITHIN THE LAST 5 DAYS. Performed at [facility], [place] [address] [place], [place] [address]    Report Status 12/19/2021 FINAL  Final  Culture, blood (routine x 2)     Status: Abnormal   Collection Time: 12/17/21  1:19 AM   Specimen: BLOOD  Result Value Ref Range Status   Specimen Description BLOOD SITE NOT SPECIFIED  Final   Special Requests   Final    BOTTLES DRAWN AEROBIC AND ANAEROBIC Blood Culture results may not be optimal due to an inadequate volume of blood received in culture bottles   Culture  Setup Time   Final    GRAM POSITIVE COCCI IN CLUSTERS IN BOTH AEROBIC AND ANAEROBIC BOTTLES CRITICAL RESULT CALLED TO, READ BACK BY AND VERIFIED WITHLoletha Grayer AMEND PHARMD 9147 12/17/21 A BROWNING Performed at [facility], [place] [address], [place], [place] [address]    Culture METHICILLIN RESISTANT STAPHYLOCOCCUS AUREUS (A)  Final   Report Status 12/19/2021  FINAL  Final   Organism ID, Bacteria METHICILLIN RESISTANT STAPHYLOCOCCUS AUREUS  Final      Susceptibility   Methicillin resistant staphylococcus aureus - MIC*    CIPROFLOXACIN <=0.5 SENSITIVE Sensitive     ERYTHROMYCIN <=0.25 SENSITIVE Sensitive     GENTAMICIN <=0.5 SENSITIVE Sensitive  OXACILLIN RESISTANT Resistant     TETRACYCLINE >=16 RESISTANT Resistant     VANCOMYCIN <=0.5 SENSITIVE Sensitive     TRIMETH/SULFA <=10 SENSITIVE Sensitive     CLINDAMYCIN <=0.25 SENSITIVE Sensitive     RIFAMPIN <=0.5 SENSITIVE Sensitive     Inducible Clindamycin NEGATIVE Sensitive     * METHICILLIN RESISTANT STAPHYLOCOCCUS AUREUS  Blood Culture ID Panel (Reflexed)     Status: Abnormal   Collection Time: 12/17/21  1:19 AM  Result Value Ref Range Status   Enterococcus faecalis NOT DETECTED NOT DETECTED Final   Enterococcus Faecium NOT DETECTED NOT DETECTED Final   Listeria monocytogenes NOT DETECTED NOT DETECTED Final   Staphylococcus species DETECTED (A) NOT DETECTED Final    Comment: CRITICAL RESULT CALLED TO, READ BACK BY AND VERIFIED WITH: C AMEND PHARMD 1613 12/17/21 A BROWNING    Staphylococcus aureus (BCID) DETECTED (A) NOT DETECTED Final    Comment: Methicillin (oxacillin)-resistant Staphylococcus aureus (MRSA). MRSA is predictably resistant to beta-lactam antibiotics (except ceftaroline). Preferred therapy is vancomycin unless clinically contraindicated. Patient requires contact precautions if  hospitalized. CRITICAL RESULT CALLED TO, READ BACK BY AND VERIFIED WITH: C AMEND PHARMD 1025 12/17/21 A BROWNING    Staphylococcus epidermidis NOT DETECTED NOT DETECTED Final   Staphylococcus lugdunensis NOT DETECTED NOT DETECTED Final   Streptococcus species NOT DETECTED NOT DETECTED Final   Streptococcus agalactiae NOT DETECTED NOT DETECTED Final   Streptococcus pneumoniae NOT DETECTED NOT DETECTED Final   Streptococcus pyogenes NOT DETECTED NOT DETECTED Final   A.calcoaceticus-baumannii NOT  DETECTED NOT DETECTED Final   Bacteroides fragilis NOT DETECTED NOT DETECTED Final   Enterobacterales NOT DETECTED NOT DETECTED Final   Enterobacter cloacae complex NOT DETECTED NOT DETECTED Final   Escherichia coli NOT DETECTED NOT DETECTED Final   Klebsiella aerogenes NOT DETECTED NOT DETECTED Final   Klebsiella oxytoca NOT DETECTED NOT DETECTED Final   Klebsiella pneumoniae NOT DETECTED NOT DETECTED Final   Proteus species NOT DETECTED NOT DETECTED Final   Salmonella species NOT DETECTED NOT DETECTED Final   Serratia marcescens NOT DETECTED NOT DETECTED Final   Haemophilus influenzae NOT DETECTED NOT DETECTED Final   Neisseria meningitidis NOT DETECTED NOT DETECTED Final   Pseudomonas aeruginosa NOT DETECTED NOT DETECTED Final   Stenotrophomonas maltophilia NOT DETECTED NOT DETECTED Final   Candida albicans NOT DETECTED NOT DETECTED Final   Candida auris NOT DETECTED NOT DETECTED Final   Candida glabrata NOT DETECTED NOT DETECTED Final   Candida krusei NOT DETECTED NOT DETECTED Final   Candida parapsilosis NOT DETECTED NOT DETECTED Final   Candida tropicalis NOT DETECTED NOT DETECTED Final   Cryptococcus neoformans/gattii NOT DETECTED NOT DETECTED Final   Meth resistant mecA/C and MREJ DETECTED (A) NOT DETECTED Final    Comment: CRITICAL RESULT CALLED TO, READ BACK BY AND VERIFIED WITHLoletha Grayer AMEND PHARMD 1613 12/17/21 A BROWNING Performed at [facility], [address], [place], [place] [address]   MRSA Next Gen by PCR, Nasal     Status: Abnormal   Collection Time: 12/19/21 12:40 AM   Specimen: Nasal Mucosa; Nasal Swab  Result Value Ref Range Status   MRSA by PCR Next Gen DETECTED (A) NOT DETECTED Final    Comment: RESULT CALLED TO, READ BACK BY AND VERIFIED WITH: RN M.ENDISON 12/19/21@5 :45 BY TW (NOTE) The GeneXpert MRSA Assay (FDA approved for NASAL specimens only), is one component of a comprehensive MRSA colonization surveillance program. It is not intended to diagnose  MRSA infection nor to guide or  monitor treatment for MRSA infections. Test performance is not FDA approved in patients less than 53 years old. Performed at [facility], [place] [address], [place], [place] [address]   Culture, blood (routine x 2)     Status: None (Preliminary result)   Collection Time: 12/19/21  6:32 AM   Specimen: BLOOD  Result Value Ref Range Status   Specimen Description BLOOD LEFT ANTECUBITAL  Final   Special Requests   Final    BOTTLES DRAWN AEROBIC ONLY Blood Culture results may not be optimal due to an inadequate volume of blood received in culture bottles   Culture   Final    NO GROWTH 2 DAYS Performed at [facility], [place] [address], [place], [place] [address]    Report Status PENDING  Incomplete  Culture, blood (routine x 2)     Status: None (Preliminary result)   Collection Time: 12/19/21  6:43 AM   Specimen: BLOOD RIGHT HAND  Result Value Ref Range Status   Specimen Description BLOOD RIGHT HAND  Final   Special Requests   Final    BOTTLES DRAWN AEROBIC ONLY Blood Culture results may not be optimal due to an inadequate volume of blood received in culture bottles   Culture   Final    NO GROWTH 2 DAYS Performed at [facility], [place] [address] [place], [place] [address]    Report Status PENDING  Incomplete         Radiology Studies: No results found.      Scheduled Meds:  Chlorhexidine Gluconate Cloth  6 each Topical Q0600   heparin injection (subcutaneous)  5,000 Units Subcutaneous Q8H   mupirocin ointment  1 application Nasal BID   polyethylene glycol  34 g Oral Daily   senna-docusate  2 tablet Oral BID   verapamil  120 mg Oral Q8H   Continuous Infusions:  vancomycin       LOS: 4 days       Diamond Climes, Diamond [name] [facility]   To contact the attending provider between 7A-7P or the covering provider during after hours 7P-7A, please log into the web site [email or web address] and access using universal [place]  password for that web site. If you do not have the password, please call the hospital operator.  12/21/2021, 12:26 PM   Patient ID: Diamond [name], female   DOB: 12/01/1951, 71 y.o.   MRN: [id number] Patient ID: Diamond [name], female   DOB: [birth year], 71 y.o.   MRN: [id number] Patient ID: Diamond [name], female   DOB: 19-Nov-1951, 71 y.o.   MRN: [id number] Patient ID: Diamond [name], female   DOB: [birth year], 71 y.o.   MRN: [id number] Patient ID: Diamond [name], female   DOB: 01/31/1951, 71 y.o.   MRN: [id number]

## 2021-12-21 NOTE — Care Management Important Message (Signed)
Important Message  Patient Details  Name: Diamond [name] MRN: [id number] Date of Birth: 1951-10-18   Medicare Important Message Given:  Yes     Memory Argue 12/21/2021, 3:31 PM

## 2021-12-22 ENCOUNTER — Inpatient Hospital Stay (HOSPITAL_COMMUNITY): Payer: Medicare HMO

## 2021-12-22 ENCOUNTER — Encounter (HOSPITAL_COMMUNITY): Payer: Self-pay | Admitting: Internal Medicine

## 2021-12-22 ENCOUNTER — Inpatient Hospital Stay (HOSPITAL_COMMUNITY): Payer: Medicare HMO | Admitting: Certified Registered Nurse Anesthetist

## 2021-12-22 ENCOUNTER — Encounter (HOSPITAL_COMMUNITY)
Admission: EM | Disposition: A | Discharge status: Skilled Nursing Facility | Payer: Self-pay | Source: Home / Self Care | Attending: Internal Medicine

## 2021-12-22 DIAGNOSIS — R7881 Bacteremia: Secondary | ICD-10-CM | POA: Diagnosis not present

## 2021-12-22 DIAGNOSIS — A419 Sepsis, unspecified organism: Secondary | ICD-10-CM | POA: Diagnosis not present

## 2021-12-22 DIAGNOSIS — I10 Essential (primary) hypertension: Secondary | ICD-10-CM | POA: Diagnosis not present

## 2021-12-22 DIAGNOSIS — M4620 Osteomyelitis of vertebra, site unspecified: Secondary | ICD-10-CM | POA: Diagnosis not present

## 2021-12-22 DIAGNOSIS — R6520 Severe sepsis without septic shock: Secondary | ICD-10-CM | POA: Diagnosis not present

## 2021-12-22 DIAGNOSIS — N186 End stage renal disease: Secondary | ICD-10-CM | POA: Diagnosis not present

## 2021-12-22 HISTORY — PX: TEE WITHOUT CARDIOVERSION: SHX5443

## 2021-12-22 LAB — VANCOMYCIN, RANDOM: Vancomycin Rm: 26

## 2021-12-22 SURGERY — ECHOCARDIOGRAM, TRANSESOPHAGEAL
Anesthesia: Monitor Anesthesia Care

## 2021-12-22 MED ORDER — SODIUM CHLORIDE 0.9 % IV SOLN
INTRAVENOUS | Status: DC | PRN
Start: 2021-12-22 — End: 2021-12-22

## 2021-12-22 MED ORDER — PROPOFOL 500 MG/50ML IV EMUL
INTRAVENOUS | Status: DC | PRN
  Administered 2021-12-22: 100 ug/kg/min via INTRAVENOUS

## 2021-12-22 MED ORDER — PROPOFOL 10 MG/ML IV BOLUS
INTRAVENOUS | Status: DC | PRN
  Administered 2021-12-22 (×3): 10 mg via INTRAVENOUS

## 2021-12-22 MED ORDER — VANCOMYCIN HCL 500 MG/100ML IV SOLN
500.0000 mg | INTRAVENOUS | Status: DC
  Filled 2021-12-22 (×2): qty 100

## 2021-12-22 MED ORDER — PHENYLEPHRINE 40 MCG/ML (10ML) SYRINGE FOR IV PUSH (FOR BLOOD PRESSURE SUPPORT)
PREFILLED_SYRINGE | INTRAVENOUS | Status: DC | PRN
Start: 2021-12-22 — End: 2021-12-22
  Administered 2021-12-22 (×4): 80 ug via INTRAVENOUS

## 2021-12-22 MED ORDER — LIDOCAINE 2% (20 MG/ML) 5 ML SYRINGE
INTRAMUSCULAR | Status: DC | PRN
  Administered 2021-12-22: 60 mg via INTRAVENOUS

## 2021-12-22 MED ORDER — VANCOMYCIN HCL 500 MG/100ML IV SOLN
500.0000 mg | Freq: Once | INTRAVENOUS | Status: DC
  Filled 2021-12-22: qty 100

## 2021-12-22 MED ORDER — BISACODYL 5 MG PO TBEC
10.0000 mg | DELAYED_RELEASE_TABLET | Freq: Once | ORAL | Status: AC
Start: 2021-12-22 — End: 2021-12-22
  Administered 2021-12-22: 10 mg via ORAL
  Filled 2021-12-22: qty 2

## 2021-12-22 MED ORDER — POLYETHYLENE GLYCOL 3350 17 G PO PACK: 17.0000 g | PACK | Freq: Every day | ORAL | Status: DC | PRN

## 2021-12-22 MED ORDER — POLYETHYLENE GLYCOL 3350 17 G PO PACK
34.0000 g | PACK | Freq: Every day | ORAL | Status: DC
  Administered 2021-12-22 – 2021-12-30 (×4): 34 g via ORAL
  Filled 2021-12-22 (×5): qty 2

## 2021-12-22 NOTE — CV Procedure (Signed)
° °  Transesophageal Echocardiogram  Indications: Bacteremia  Time out performed  During this procedure the patient was administered propofol under anesthesiology supervision to achieve and maintain moderate sedation.  The patient's heart rate, blood pressure, and oxygen saturation are monitored continuously during the procedure.   Findings:  Left Ventricle: EF normal 60%  Mitral Valve: Thickened mitral valve no evidence of vegetation, mild MR  Aortic Valve: Mildly sclerotic, calcified no stenosis  Tricuspid Valve: Mild tricuspid regurgitation  Left Atrium: Normal, no left atrial appendage thrombus  Right Atrium: At the RA/IVC junction there is globular mobile mass, suspicious of thrombus/vegetation, could have been secondary to prior dialysis catheter which has been removed.  Intraatrial septum: Color Doppler normal, no shunt   Impression: Abnormal study with right atrial/IVC globular mobile mass compatible with thrombotic material/vegetative material.  Candee Furbish, MD

## 2021-12-22 NOTE — Anesthesia Procedure Notes (Signed)
Procedure Name: MAC Date/Time: 12/22/2021 1:12 PM Performed by: Janene Harvey, CRNA Pre-anesthesia Checklist: Patient identified, Emergency Drugs available, Suction available and Patient being monitored Patient Re-evaluated:Patient Re-evaluated prior to induction Oxygen Delivery Method: Simple face mask Induction Type: IV induction Placement Confirmation: positive ETCO2 Dental Injury: Teeth and Oropharynx as per pre-operative assessment

## 2021-12-22 NOTE — NC FL2 (Addendum)
[place] LEVEL OF CARE SCREENING TOOL     IDENTIFICATION  Patient Name: Diamond [name] Birthdate: 29-Jul-1951 Sex: female Admission Date (Current Location): 12/16/2021  [facility] and [place] Number:  Herbalist and Address:  [facility], [place] [address] [place], [place] [address]      Provider Number: [id number]  Attending Physician Name and Address:  Elgergawy, Silver Huguenin, MD  Relative Name and Phone Number:  Fareeda, [place])   [phone]    Current Level of Care: Hospital Recommended Level of Care: [place] Prior Approval Number:    Date Approved/Denied:   PASRR Number: [id number]  Discharge Plan: SNF    Current Diagnoses: Patient Active Problem List   Diagnosis Date Noted   Vertebral osteomyelitis ([place])    Leukocytosis 12/17/2021   Severe sepsis ([place]) 12/17/2021   Lactic acidosis 12/17/2021   Low back pain 12/17/2021   ESRD (end stage renal disease) ([place]) 12/17/2021   Mild intermittent asthma 12/17/2021   Vitamin D deficiency 01/02/2021   Hyperparathyroidism ([place]) 12/31/2020   CKD (chronic kidney disease), stage III ([place]) 11/10/2019   Cardiomegaly 11/05/2019   Hyperkalemia 11/05/2019   Hypokalemia 10/18/2019   Pneumonia due to COVID-19 virus 10/17/2019   CAP (community acquired pneumonia) 10/16/2019   Hypertension    Asthma    Gout    AKI (acute kidney injury) ([place])    Hyponatremia    Anemia of chronic disease     Orientation RESPIRATION BLADDER Height & Weight     Self, Situation, Place, Time  Normal External catheter Weight: 64.5 kg Height:  5\' 7"  (170.2 cm)  BEHAVIORAL SYMPTOMS/MOOD NEUROLOGICAL BOWEL NUTRITION STATUS      Continent Diet  AMBULATORY STATUS COMMUNICATION OF NEEDS Skin   Extensive Assist Verbally                         Personal Care Assistance Level of Assistance  Bathing, Dressing Bathing Assistance: Maximum assistance   Dressing Assistance: Maximum  assistance     Functional Limitations Info             SPECIAL CARE FACTORS FREQUENCY  PT (By licensed PT), OT (By licensed OT)     PT Frequency: 5X week OT Frequency: 5X week            Contractures Contractures Info: Not present    Additional Factors Info  Code Status, Allergies Code Status Info: Full Allergies Info: NKA     Isolation Precautions Info: MRSA     Current Medications (12/22/2021):  This is the current hospital active medication list Current Facility-Administered Medications  Medication Dose Route Frequency Provider Last Rate Last Admin   0.9 %  sodium chloride infusion   Intravenous Continuous Bhagat, Bhavinkumar, PA 20 mL/hr at 12/21/21 2229 New Bag at 12/21/21 2229   [MAR Hold] acetaminophen (TYLENOL) tablet 650 mg  650 mg Oral Q6H PRN Howerter, Justin B, DO   650 mg at 12/22/21 1003   Or   [MAR Hold] acetaminophen (TYLENOL) suppository 650 mg  650 mg Rectal Q6H PRN Howerter, Justin B, DO       [MAR Hold] albuterol (PROVENTIL) (2.5 MG/3ML) 0.083% nebulizer solution 2.5 mg  2.5 mg Inhalation Q4H PRN Howerter, Justin B, DO       [MAR Hold] Chlorhexidine Gluconate Cloth 2 % PADS 6 each  6 each Topical Q0600 Roney Jaffe, MD   6 each at 12/22/21 [phone]   [  MAR Hold] heparin injection 5,000 Units  5,000 Units Subcutaneous Q8H Elgergawy, Silver Huguenin, MD   5,000 Units at 12/22/21 0658   [facility] Hold] mupirocin ointment (BACTROBAN) 2 % 1 application  1 application Nasal BID Elgergawy, Silver Huguenin, MD   1 application at 46/56/81 0952   [MAR Hold] naloxone (NARCAN) injection 0.4 mg  0.4 mg Intravenous PRN Howerter, Justin B, DO       [MAR Hold] oxyCODONE (Oxy IR/ROXICODONE) immediate release tablet 5 mg  5 mg Oral Q4H PRN Elgergawy, Silver Huguenin, MD   5 mg at 12/22/21 1003   [MAR Hold] polyethylene glycol (MIRALAX / GLYCOLAX) packet 17 g  17 g Oral Daily PRN Elgergawy, Silver Huguenin, MD       [MAR Hold] polyethylene glycol (MIRALAX / GLYCOLAX) packet 34 g  34 g Oral Daily Elgergawy,  Silver Huguenin, MD   34 g at 12/21/21 1033   polyethylene glycol (MIRALAX / GLYCOLAX) packet 34 g  34 g Oral Daily Elgergawy, Silver Huguenin, MD       [MAR Hold] senna-docusate (Senokot-S) tablet 2 tablet  2 tablet Oral BID Elgergawy, Silver Huguenin, MD   2 tablet at 12/22/21 0953   [MAR Hold] vancomycin (VANCOREADY) IVPB 500 mg/100 mL  500 mg Intravenous Q T,Th,Sa-HD Willette Cluster, [facility]       [facility] Hold] verapamil (CALAN) tablet 120 mg  120 mg Oral Q8H Howerter, Justin B, DO   120 mg at 12/22/21 2751   Facility-Administered Medications Ordered in Other Encounters  Medication Dose Route Frequency Provider Last Rate Last Admin   0.9 %  sodium chloride infusion   Intravenous Continuous PRN Janene Harvey, CRNA   New Bag at 12/22/21 1300   lidocaine 2% (20 mg/mL) 5 mL syringe   Intravenous Anesthesia Intra-op Janene Harvey, CRNA   60 mg at 12/22/21 1314   PHENYLephrine 40 mcg/ml in normal saline Adult IV Push Syringe (For Blood Pressure Support)   Intravenous Anesthesia Intra-op Janene Harvey, CRNA   80 mcg at 12/22/21 1347   propofol (DIPRIVAN) 10 mg/mL bolus/IV push   Intravenous Anesthesia Intra-op Janene Harvey, CRNA   10 mg at 12/22/21 1325   propofol (DIPRIVAN) 500 MG/50ML infusion   Intravenous Continuous PRN Janene Harvey, CRNA 38.7 mL/hr at 12/22/21 1348 100 mcg/kg/min at 12/22/21 1348     Discharge Medications: Please see discharge summary for a list of discharge medications.  Relevant Imaging Results:  Relevant Lab Results:   Additional Information SSN [id number] phizer vaccine 07/12/20, 08/04/20 Requires transport to dialysis TTS [place], RN

## 2021-12-22 NOTE — Progress Notes (Signed)
OT Cancellation Note  Patient Details Name: Diamond [name] MRN: [id number] DOB: 1951-07-27   Cancelled Treatment:    Reason Eval/Treat Not Completed: Patient at procedure or test/ unavailable (Pt off of floor for TEE. OT treat to f/u as appropriate.)  Lorry Furber A Radin Raptis 12/22/2021, 1:12 PM

## 2021-12-22 NOTE — Progress Notes (Addendum)
Pharmacy Antibiotic Note  Diamond [name] is a 71 y.o. female admitted on 12/16/2021 with 3 day hx of R-sided back pain and found to have MRSA bacteremia. Pt with ESRD on HD TTS schedule with last HD session 12/19/21. Pharmacy has been consulted for Vancomycin dosing.  Vancomycin random pre-HD level returned at 26 on 12/22/21 @ 01:24. Patient has not tolerated full HD session previously, will decrease dose for an estimated pre-HD level ~23. Of note, patient has a line holiday from 1/14 to 1/17.  Plan: Decrease dose to  Vancomycin 500 mg IV qHD (TThS) Follow-up cx data and clinical progress. Level as need. Goal pre-HD level 15-25 mcg/ml.  Height: 5\' 7"  (170.2 cm) Weight: 64.5 kg (142 lb 3.2 oz) IBW/kg (Calculated) : 61.6  Temp (24hrs), Avg:99.3 F (37.4 C), Min:98.1 F (36.7 C), Max:102.1 F (38.9 C)  Recent Labs  Lab 12/16/21 1450 12/17/21 0041 12/17/21 0237 12/17/21 0554 12/17/21 0755 12/18/21 0736 12/19/21 0630 12/19/21 0644 12/21/21 0645 12/22/21 0124  WBC 20.5*  --   --  20.3*  --   --   --  12.2*  --   --   CREATININE 9.73*  --   --  9.58*  --  5.93* 6.98*  --  6.58*  --   LATICACIDVEN  --  2.7* 2.1*  --  1.8  --   --   --   --   --   VANCORANDOM  --   --   --   --   --   --   --   --   --  26     Estimated Creatinine Clearance: 7.5 mL/min (A) (by C-G formula based on SCr of 6.58 mg/dL (H)).    No Known Allergies  Antimicrobials this admission: Ceftriaxone 1/12>> Metronid 1/12>> Vanc 1/12 >> Cefepime x1  Microbiology results: 1/14 Bcx: NGTD 1/12 BCx x 2: MRSA 1/11 UCx: negative 1/11 COVID / Flu negative   Thank you for allowing pharmacy to participate in this patient's care.  Lestine Box, PharmD PGY2 Infectious Diseases Pharmacy Resident   Please check [email or web address] for unit-specific pharmacy phone numbers

## 2021-12-22 NOTE — Interval H&P Note (Signed)
History and Physical Interval Note:  12/22/2021 1:01 PM  Diamond [name]  has presented today for surgery, with the diagnosis of BACTEREMIA.  The various methods of treatment have been discussed with the patient and family. After consideration of risks, benefits and other options for treatment, the patient has consented to  Procedure(s): TRANSESOPHAGEAL ECHOCARDIOGRAM (TEE) (N/A) as a surgical intervention.  The patient's history has been reviewed, patient examined, no change in status, stable for surgery.  I have reviewed the patient's chart and labs.  Questions were answered to the patient's satisfaction.     [employer]

## 2021-12-22 NOTE — Anesthesia Preprocedure Evaluation (Signed)
Anesthesia Evaluation  Patient identified by MRN, date of birth, ID band Patient awake    Reviewed: Allergy & Precautions, NPO status , Patient's Chart, lab work & pertinent test results  Airway Mallampati: II  TM Distance: >3 FB     Dental  (+) Dental Advisory Given   Pulmonary asthma ,    breath sounds clear to auscultation       Cardiovascular hypertension, Pt. on medications + dysrhythmias Atrial Fibrillation  Rhythm:Regular Rate:Normal     Neuro/Psych negative neurological ROS     GI/Hepatic negative GI ROS, Neg liver ROS,   Endo/Other  negative endocrine ROS  Renal/GU ESRF and DialysisRenal disease     Musculoskeletal   Abdominal   Peds  Hematology  (+) anemia ,   Anesthesia Other Findings   Reproductive/Obstetrics                             Anesthesia Physical Anesthesia Plan  ASA: 3  Anesthesia Plan: MAC   Post-op Pain Management: Minimal or no pain anticipated   Induction:   PONV Risk Score and Plan: 2 and Propofol infusion  Airway Management Planned: Natural Airway and Nasal Cannula  Additional Equipment:   Intra-op Plan:   Post-operative Plan:   Informed Consent: I have reviewed the patients History and Physical, chart, labs and discussed the procedure including the risks, benefits and alternatives for the proposed anesthesia with the patient or authorized representative who has indicated his/her understanding and acceptance.       Plan Discussed with:   Anesthesia Plan Comments:         Anesthesia Quick Evaluation

## 2021-12-22 NOTE — Anesthesia Preprocedure Evaluation (Addendum)
Anesthesia Evaluation  Patient identified by MRN, date of birth, ID band Patient awake    Reviewed: Allergy & Precautions, NPO status , Patient's Chart, lab work & pertinent test results  Airway Mallampati: II  TM Distance: >3 FB Neck ROM: Full    Dental  (+) Dental Advisory Given, Missing   Pulmonary asthma ,    Pulmonary exam normal breath sounds clear to auscultation       Cardiovascular hypertension, Pt. on medications + dysrhythmias Atrial Fibrillation  Rhythm:Regular Rate:Tachycardia     Neuro/Psych negative neurological ROS  negative psych ROS   GI/Hepatic negative GI ROS, Neg liver ROS,   Endo/Other  negative endocrine ROS  Renal/GU ESRFRenal disease (TTHSAT)     Musculoskeletal negative musculoskeletal ROS (+)   Abdominal   Peds  Hematology  (+) Blood dyscrasia, anemia ,   Anesthesia Other Findings   Reproductive/Obstetrics                            Anesthesia Physical Anesthesia Plan  ASA: 3  Anesthesia Plan: Regional   Post-op Pain Management: Tylenol PO (pre-op)   Induction: Intravenous  PONV Risk Score and Plan: 2 and Propofol infusion and Treatment may vary due to age or medical condition  Airway Management Planned: Natural Airway and Simple Face Mask  Additional Equipment:   Intra-op Plan:   Post-operative Plan:   Informed Consent: I have reviewed the patients History and Physical, chart, labs and discussed the procedure including the risks, benefits and alternatives for the proposed anesthesia with the patient or authorized representative who has indicated his/her understanding and acceptance.     Dental advisory given  Plan Discussed with: CRNA  Anesthesia Plan Comments:        Anesthesia Quick Evaluation

## 2021-12-22 NOTE — Progress Notes (Addendum)
PROGRESS NOTE    Diamond [name]  ZOX:[id number] DOB: [birth year] DOA: 12/16/2021 PCP: [name], Diamond [name]   Chief Complaint  Patient presents with   Back Pain    Brief Narrative:   Diamond [name] is a 71 y.o. female with medical history significant for incisional disease on hemodialysis on Tuesday, Thursday, Saturday schedule, mild intermittent asthma, hypertension, anemia of chronic kidney disease, who is admitted to [facility] on 12/16/2021 with severe sepsis after presenting from home to [facility] ED complaining of right-sided back pain.  Her work-up was significant for sepsis, blood pressure came back growing MRSA.  Will her 2D echo suspicious for endocarditis, and MRI lower back significant for discitis/osteomyelitis.      Assessment & Plan:   Principal Problem:   Severe sepsis ([place]) Active Problems:   Hypertension   Anemia of chronic disease   Leukocytosis   Lactic acidosis   Low back pain   ESRD (end stage renal disease) (HCC)   Mild intermittent asthma   Vertebral osteomyelitis (HCC)   Sepsis, present on admission, due to  MRSA bacteremia L5-S1 discitis/ osteomyelitis  Endocarditis -Present on admission, leukocytosis 20 K, tachycardic 109, tachypneic 26, with elevated lactic acid of 2.7. -Blood culture showing MRSA , currently on IV vancomycin -As well possible urinary source as evidence of cystitis on imaging.,  But urine cultures are negative, Rocephin-Flagyl has been stopped. -2D echo significant for possible mitral valve vegetation, will need TEE, plan for Tuesday -Lumbar/thoracic spine MRI significant for discitis/osteomyelitis will need prolonged IV antibiotic therapy -Will need line holiday, vascular surgery consulted,  tunneled cath removed 1/14 by vascular surgery, plan likely for AV fistula next Wednesday if cultures remain negative.  -Antibiotics management per ID, likely will need prolonged course of 8 weeks.fever 102 yesterday, has resolved, no need  to repeat blood cultures or delay line placement per ID.  Dense mitral valve thickening/fibrinous density, TEE pending for today -Surveillance blood cultures 1/14 remain negative.  End-stage renal disease:  - On hemodialysis on Tuesday, Thursday, Saturday schedule.  Renal consulted -Next dialysis possibly on Wednesday as she is currently on line holiday.  Plan for AV fistula by vascular surgery on 1/18.   Mild intermittent asthma:  -  Continue home prn albuterol inhaler.  Add on serum magnesium level.   Anemia of chronic kidney disease:  -Procrit and IV iron per renal   Hypertension:  -Continue with verapamil, holding hydrochlorothiazide, will add as needed hydralazine.   Constipation -Continue with laxatives.  Pulmonary nodule -Pulmonary nodule in CT chest 2011, this appears to be stable on repeat imaging, but evidence of soft tissue density in lateral middle lobe, and additional left pulmonary nodules measuring up to 4 mm, patient will need repeat CT chest in 81-month   DVT prophylaxis: Heparin Code Status: Full Family Communication: Discussed with daughter at bedside 1/16. Disposition:   Status is: Inpatient  The patient will require care spanning > 2 midnights and should be moved to inpatient because: Now she is with a gram-positive cocci bacteremia, in need of IV antibiotic and further work-up.      Consultants:  Renal ID  vascular   Subjective:  He is afebrile over last 24 hours, lower back pain still present but has improved, she was able to sit on the chair for more than 2 hours yesterday.    Objective: Vitals:   12/22/21 0033 12/22/21 0447 12/22/21 0751 12/22/21 1230  BP: 134/83 (!) 142/93 124/76 (!) 156/99  Pulse: 93 91  86 92  Resp: 20 18 16 15   Temp: 99.4 F (37.4 C) 98.4 F (36.9 C) 98.9 F (37.2 C) 98.2 F (36.8 C)  TempSrc: Oral Oral Oral Temporal  SpO2: 95% 98% 95% 98%  Weight:      Height:        Intake/Output Summary (Last 24 hours) at  12/22/2021 1348 Last data filed at 12/22/2021 0449 Gross per 24 hour  Intake 240 ml  Output 150 ml  Net 90 ml    Filed Weights   12/19/21 0750 12/19/21 1052 12/20/21 0451  Weight: 66.7 kg 65 kg 64.5 kg    Examination:  Awake Alert, Oriented X 3, No new F.N deficits, Normal affect Symmetrical Chest wall movement, Good air movement bilaterally, CTAB RRR,No Gallops,Rubs or new Murmurs, No Parasternal Heave +ve B.Sounds, Abd Soft, No tenderness, No rebound - guarding or rigidity. No Cyanosis, Clubbing or edema, No new Rash or bruise     Data Reviewed: I have personally reviewed following labs and imaging studies  CBC: Recent Labs  Lab 12/16/21 1450 12/17/21 0554 12/19/21 0644  WBC 20.5* 20.3* 12.2*  NEUTROABS 19.0* 18.3*  --   HGB 12.5 11.3* 10.4*  HCT 39.0 36.0 30.3*  MCV 97.5 100.8* 92.9  PLT 210 153 008    Basic Metabolic Panel: Recent Labs  Lab 12/16/21 1450 12/17/21 0554 12/18/21 0736 12/19/21 0630 12/21/21 0645  NA 130* 133* 132* 132* 128*  K 4.5 5.4* 3.5 3.4* 3.7  CL 97* 94* 98 93* 93*  CO2 15* 14* 21* 21* 23  GLUCOSE 102* 82 93 101* 92  BUN 73* 80* 40* 60* 55*  CREATININE 9.73* 9.58* 5.93* 6.98* 6.58*  CALCIUM 9.9 8.9 8.9 9.5 9.2  MG  --  1.9  --   --   --   PHOS  --  7.4* 5.8* 5.9* 7.1*    GFR: Estimated Creatinine Clearance: 7.5 mL/min (A) (by C-G formula based on SCr of 6.58 mg/dL (H)).  Liver Function Tests: Recent Labs  Lab 12/16/21 1450 12/17/21 0554 12/17/21 1251 12/17/21 1437 12/18/21 0736 12/19/21 0630 12/21/21 0645  AST 15 16 18  11*  --   --   --   ALT 10 8 7 8   --   --   --   ALKPHOS 71 92 67 80  --   --   --   BILITOT 1.1 1.3* 1.6* 1.1  --   --   --   PROT 7.2 6.3* 5.5* 6.7  --   --   --   ALBUMIN 3.0* 2.5* 2.4* 2.5* 2.2* 2.0* 2.0*    CBG: No results for input(s): GLUCAP in the last 168 hours.   Recent Results (from the past 240 hour(s))  Resp Panel by RT-PCR (Flu A&B, Covid) Nasopharyngeal Swab     Status: None    Collection Time: 12/16/21  6:02 PM   Specimen: Nasopharyngeal Swab; Nasopharyngeal(NP) swabs in vial transport medium  Result Value Ref Range Status   SARS Coronavirus 2 by RT PCR NEGATIVE NEGATIVE Final    Comment: (NOTE) SARS-CoV-2 target nucleic acids are NOT DETECTED.  The SARS-CoV-2 RNA is generally detectable in upper respiratory specimens during the acute phase of infection. The lowest concentration of SARS-CoV-2 viral copies this assay can detect is 138 copies/mL. A negative result does not preclude SARS-Cov-2 infection and should not be used as the sole basis for treatment or other patient management decisions. A negative result may occur with  improper specimen collection/handling, submission of  specimen other than nasopharyngeal swab, presence of viral mutation(s) within the areas targeted by this assay, and inadequate number of viral copies(<138 copies/mL). A negative result must be combined with clinical observations, patient history, and epidemiological information. The expected result is Negative.  Fact Sheet for Patients:  EntrepreneurPulse.com.au  Fact Sheet for Healthcare Providers:  IncredibleEmployment.be  This test is no t yet approved or cleared by the [place] FDA and  has been authorized for detection and/or diagnosis of SARS-CoV-2 by FDA under an Emergency Use Authorization (EUA). This EUA will remain  in effect (meaning this test can be used) for the duration of the COVID-19 declaration under Section 564(b)(1) of the Act, 21 U.S.C.section 360bbb-3(b)(1), unless the authorization is terminated  or revoked sooner.       Influenza A by PCR NEGATIVE NEGATIVE Final   Influenza B by PCR NEGATIVE NEGATIVE Final    Comment: (NOTE) The Xpert Xpress SARS-CoV-2/FLU/RSV plus assay is intended as an aid in the diagnosis of influenza from Nasopharyngeal swab specimens and should not be used as a sole basis for treatment.  Nasal washings and aspirates are unacceptable for Xpert Xpress SARS-CoV-2/FLU/RSV testing.  Fact Sheet for Patients: EntrepreneurPulse.com.au  Fact Sheet for Healthcare Providers: IncredibleEmployment.be  This test is not yet approved or cleared by the [place] FDA and has been authorized for detection and/or diagnosis of SARS-CoV-2 by FDA under an Emergency Use Authorization (EUA). This EUA will remain in effect (meaning this test can be used) for the duration of the COVID-19 declaration under Section 564(b)(1) of the Act, 21 U.S.C. section 360bbb-3(b)(1), unless the authorization is terminated or revoked.  Performed at [facility], [place] [address], [place], [place] [address]   Urine Culture     Status: None   Collection Time: 12/16/21 11:49 PM   Specimen: Urine, Clean Catch  Result Value Ref Range Status   Specimen Description URINE, CLEAN CATCH  Final   Special Requests NONE  Final   Culture   Final    NO GROWTH Performed at [facility], [place] [address] [place], [place] [address]    Report Status 12/17/2021 FINAL  Final  Culture, blood (routine x 2)     Status: Abnormal   Collection Time: 12/17/21  1:14 AM   Specimen: BLOOD LEFT HAND  Result Value Ref Range Status   Specimen Description BLOOD LEFT HAND  Final   Special Requests   Final    BOTTLES DRAWN AEROBIC AND ANAEROBIC Blood Culture adequate volume   Culture  Setup Time   Final    GRAM POSITIVE COCCI IN CLUSTERS IN BOTH AEROBIC AND ANAEROBIC BOTTLES CRITICAL VALUE NOTED.  VALUE IS CONSISTENT WITH PREVIOUSLY REPORTED AND CALLED VALUE.    Culture (A)  Final    STAPHYLOCOCCUS AUREUS SUSCEPTIBILITIES PERFORMED ON PREVIOUS CULTURE WITHIN THE LAST 5 DAYS. Performed at [facility], [place] [address], [place], [place] [address]    Report Status 12/19/2021 FINAL  Final  Culture, blood (routine x 2)     Status: Abnormal   Collection Time: 12/17/21  1:19 AM    Specimen: BLOOD  Result Value Ref Range Status   Specimen Description BLOOD SITE NOT SPECIFIED  Final   Special Requests   Final    BOTTLES DRAWN AEROBIC AND ANAEROBIC Blood Culture results may not be optimal due to an inadequate volume of blood received in culture bottles   Culture  Setup Time   Final    GRAM POSITIVE  COCCI IN CLUSTERS IN BOTH AEROBIC AND ANAEROBIC BOTTLES CRITICAL RESULT CALLED TO, READ BACK BY AND VERIFIED WITHLoletha Grayer AMEND PHARMD 1613 12/17/21 A BROWNING Performed at [facility], [place] [address], [place], [place] [address]    Culture METHICILLIN RESISTANT STAPHYLOCOCCUS AUREUS (A)  Final   Report Status 12/19/2021 FINAL  Final   Organism ID, Bacteria METHICILLIN RESISTANT STAPHYLOCOCCUS AUREUS  Final      Susceptibility   Methicillin resistant staphylococcus aureus - MIC*    CIPROFLOXACIN <=0.5 SENSITIVE Sensitive     ERYTHROMYCIN <=0.25 SENSITIVE Sensitive     GENTAMICIN <=0.5 SENSITIVE Sensitive     OXACILLIN RESISTANT Resistant     TETRACYCLINE >=16 RESISTANT Resistant     VANCOMYCIN <=0.5 SENSITIVE Sensitive     TRIMETH/SULFA <=10 SENSITIVE Sensitive     CLINDAMYCIN <=0.25 SENSITIVE Sensitive     RIFAMPIN <=0.5 SENSITIVE Sensitive     Inducible Clindamycin NEGATIVE Sensitive     * METHICILLIN RESISTANT STAPHYLOCOCCUS AUREUS  Blood Culture ID Panel (Reflexed)     Status: Abnormal   Collection Time: 12/17/21  1:19 AM  Result Value Ref Range Status   Enterococcus faecalis NOT DETECTED NOT DETECTED Final   Enterococcus Faecium NOT DETECTED NOT DETECTED Final   Listeria monocytogenes NOT DETECTED NOT DETECTED Final   Staphylococcus species DETECTED (A) NOT DETECTED Final    Comment: CRITICAL RESULT CALLED TO, READ BACK BY AND VERIFIED WITH: C AMEND PHARMD 1613 12/17/21 A BROWNING    Staphylococcus aureus (BCID) DETECTED (A) NOT DETECTED Final    Comment: Methicillin (oxacillin)-resistant Staphylococcus aureus (MRSA). MRSA is predictably resistant to  beta-lactam antibiotics (except ceftaroline). Preferred therapy is vancomycin unless clinically contraindicated. Patient requires contact precautions if  hospitalized. CRITICAL RESULT CALLED TO, READ BACK BY AND VERIFIED WITH: C AMEND PHARMD 1740 12/17/21 A BROWNING    Staphylococcus epidermidis NOT DETECTED NOT DETECTED Final   Staphylococcus lugdunensis NOT DETECTED NOT DETECTED Final   Streptococcus species NOT DETECTED NOT DETECTED Final   Streptococcus agalactiae NOT DETECTED NOT DETECTED Final   Streptococcus pneumoniae NOT DETECTED NOT DETECTED Final   Streptococcus pyogenes NOT DETECTED NOT DETECTED Final   A.calcoaceticus-baumannii NOT DETECTED NOT DETECTED Final   Bacteroides fragilis NOT DETECTED NOT DETECTED Final   Enterobacterales NOT DETECTED NOT DETECTED Final   Enterobacter cloacae complex NOT DETECTED NOT DETECTED Final   Escherichia coli NOT DETECTED NOT DETECTED Final   Klebsiella aerogenes NOT DETECTED NOT DETECTED Final   Klebsiella oxytoca NOT DETECTED NOT DETECTED Final   Klebsiella pneumoniae NOT DETECTED NOT DETECTED Final   Proteus species NOT DETECTED NOT DETECTED Final   Salmonella species NOT DETECTED NOT DETECTED Final   Serratia marcescens NOT DETECTED NOT DETECTED Final   Haemophilus influenzae NOT DETECTED NOT DETECTED Final   Neisseria meningitidis NOT DETECTED NOT DETECTED Final   Pseudomonas aeruginosa NOT DETECTED NOT DETECTED Final   Stenotrophomonas maltophilia NOT DETECTED NOT DETECTED Final   Candida albicans NOT DETECTED NOT DETECTED Final   Candida auris NOT DETECTED NOT DETECTED Final   Candida glabrata NOT DETECTED NOT DETECTED Final   Candida krusei NOT DETECTED NOT DETECTED Final   Candida parapsilosis NOT DETECTED NOT DETECTED Final   Candida tropicalis NOT DETECTED NOT DETECTED Final   Cryptococcus neoformans/gattii NOT DETECTED NOT DETECTED Final   Meth resistant mecA/C and MREJ DETECTED (A) NOT DETECTED Final    Comment: CRITICAL  RESULT CALLED TO, READ BACK BY AND VERIFIED WITH: C AMEND PHARMD 1613 12/17/21 A BROWNING  Performed at [facility], [place] [address], [place], [place] [address]   MRSA Next Gen by PCR, Nasal     Status: Abnormal   Collection Time: 12/19/21 12:40 AM   Specimen: Nasal Mucosa; Nasal Swab  Result Value Ref Range Status   MRSA by PCR Next Gen DETECTED (A) NOT DETECTED Final    Comment: RESULT CALLED TO, READ BACK BY AND VERIFIED WITH: RN M.ENDISON 12/19/21@5 :45 BY TW (NOTE) The GeneXpert MRSA Assay (FDA approved for NASAL specimens only), is one component of a comprehensive MRSA colonization surveillance program. It is not intended to diagnose MRSA infection nor to guide or monitor treatment for MRSA infections. Test performance is not FDA approved in patients less than 70 years old. Performed at [facility], [place] [address], [place], [place] [address]   Culture, blood (routine x 2)     Status: None (Preliminary result)   Collection Time: 12/19/21  6:32 AM   Specimen: BLOOD  Result Value Ref Range Status   Specimen Description BLOOD LEFT ANTECUBITAL  Final   Special Requests   Final    BOTTLES DRAWN AEROBIC ONLY Blood Culture results may not be optimal due to an inadequate volume of blood received in culture bottles   Culture   Final    NO GROWTH 3 DAYS Performed at [facility], [place] [address], [place], [place] [address]    Report Status PENDING  Incomplete  Culture, blood (routine x 2)     Status: None (Preliminary result)   Collection Time: 12/19/21  6:43 AM   Specimen: BLOOD RIGHT HAND  Result Value Ref Range Status   Specimen Description BLOOD RIGHT HAND  Final   Special Requests   Final    BOTTLES DRAWN AEROBIC ONLY Blood Culture results may not be optimal due to an inadequate volume of blood received in culture bottles   Culture   Final    NO GROWTH 3 DAYS Performed at [facility], [place] [address] [place], [place] [address]    Report Status PENDING   Incomplete         Radiology Studies: CT CHEST WO CONTRAST  Result Date: 12/22/2021 CLINICAL DATA:  Follow-up 6-8 mm lung nodule left lower lobe present on CT chest in 2011. Shortness of breath. Follow-up abnormal x-ray. EXAM: CT CHEST WITHOUT CONTRAST TECHNIQUE: Multidetector CT imaging of the chest was performed following the standard protocol without IV contrast. RADIATION DOSE REDUCTION: This exam was performed according to the departmental dose-optimization program which includes automated exposure control, adjustment of the mA and/or kV according to patient size and/or use of iterative reconstruction technique. COMPARISON:  Chest radiographs 12/17/2021, CT chest 10/15/2010 FINDINGS: Cardiovascular: Heart size is mildly enlarged. Coronary artery and aortic root calcifications are noted. The ascending aorta measures up to 4.0 cm in caliber, borderline aneurysmal. This is increased from 3.6 cm on prior CT. Moderate calcifications within the aortic arch. Mediastinum/Nodes: No axillary, mediastinal, or hilar pathologically enlarged lymph nodes by CT criteria. 9 mm short axis inferior right paratracheal lymph node is similar to prior. The thyroid gland is grossly unremarkable. The esophagus follows a normal course of normal caliber. Lungs/Pleura: The central airways are patent. A 6 x 5 mm left lower lobe pulmonary nodule is unchanged from 10/15/2010 and benign. There is an elongated, tubular soft tissue density within the lateral middle lobe (axial 100/4, sagittal 34/6) measuring up to 7 x 7 x 14 mm (transverse by AP by craniocaudal). Due  to the elongated shape, it is unclear whether this may represent a vascular lesion or airway impaction, however this does not appear to involve an airway. This is new compared to the prior remote 10/15/2010 CT. There is a mild-to-moderate right pleural effusion. Question mild nodularity of the adjacent posterior right lower lobe pleura, however this appearance may just be  secondary to subsegmental atelectasis. Recommend attention on follow-up. There is a 4 mm nodule within the lingula that may be slightly more distinct, however previously at least a ground-glass density was seen in this same region on remote 10/15/2010 CT (current CT axial 95/4). There are two nearby peripheral left upper lobe apparent cystic/cavitary nodules measuring approximately 4 mm (axial image 62) and 4 mm (axial image 58). These may be secondary to an infectious process. Punctate calcified granuloma is seen at the far inferior aspect of the left lower lobe (axial image 135), benign. Punctate posteromedial left lower lobe 3 mm nodule (axial image 118). Punctate benign calcified granuloma within left lower lobe (axial image 116). There is curvilinear subsegmental atelectasis versus scarring within the peripheral superior aspect of the left upper lobe. No pneumothorax. Upper Abdomen: The gallbladder is partially visualized. Therer appears to be a low-density gallstone measuring at least 19 mm. The gallbladder wall is not well evaluated on the current noncontrast CT with only partial visualization of the gallbladder. If there are symptoms of right upper quadrant pain, a right upper quadrant abdominal ultrasound may help for further characterization. There is a 2 mm right upper pole renal stone. There are punctate calcifications within the lateral aspect of the spleen. Musculoskeletal: There is again nonspecific but apparently stable mild nodularity within the right breast just deep to the right nipple. There is again benign dystrophic calcification within the left breast similar to prior. Mild to moderate multilevel degenerative disc changes of the visualized thoracic spine. IMPRESSION: Compared to 10/15/2010: 1. The previously seen left lower lobe 5 mm nodule is unchanged from prior, demonstrating 11 year stability. This is benign. 2. There is an elongated, tubular soft tissue density within the lateral middle  lobe measuring 7 x 7 x 14 mm. This is indeterminate and could represent a vascular abnormality or a pulmonary nodule. Recommend follow-up CT in 3 months to assess stability. A PET-CT also may be considered for further evaluation. If the nodules are stable at time of repeat CT, then future CT at 18-24 months (from today's scan) is considered optional for low-risk patients, but is recommended for high-risk patients. This recommendation follows the consensus statement: Guidelines for Management of Incidental Pulmonary Nodules Detected on CT Images: From the Fleischner Society 2017; Radiology 2017; 284:228-243. 3. Additional left pulmonary nodules measuring up to 4 mm. Recommend attention on follow-up. 4. Mild-to-moderate right pleural effusion. Question mild nodularity of the adjacent posterior right lower lobe pleura, however this appearance may just be secondary to subsegmental atelectasis. Recommend attention on follow-up. Electronically Signed   By: Yvonne Kendall M.D.   On: 12/22/2021 10:11        Scheduled Meds:  [MAR Hold] Chlorhexidine Gluconate Cloth  6 each Topical Q0600   [MAR Hold] heparin injection (subcutaneous)  5,000 Units Subcutaneous Q8H   [MAR Hold] mupirocin ointment  1 application Nasal BID   [MAR Hold] polyethylene glycol  34 g Oral Daily   [MAR Hold] senna-docusate  2 tablet Oral BID   [MAR Hold] verapamil  120 mg Oral Q8H   Continuous Infusions:  sodium chloride 20 mL/hr at 12/21/21 2229   [  MAR Hold] vancomycin       LOS: 5 days       Diamond Climes, Diamond [name] [facility]   To contact the attending provider between 7A-7P or the covering provider during after hours 7P-7A, please log into the web site [email or web address] and access using universal [place] password for that web site. If you do not have the password, please call the hospital operator.  12/22/2021, 1:48 PM   Patient ID: Diamond [name], female   DOB: 1951-03-02, 71 y.o.   MRN: [id number] Patient ID: Diamond [name], female   DOB: Nov 13, 1951, 71 y.o.   MRN: [id number] Patient ID: Diamond [name], female   DOB: 1951/10/18, 71 y.o.   MRN: [id number] Patient ID: Diamond [name], female   DOB: 31-Aug-1951, 71 y.o.   MRN: [id number] Patient ID: Diamond [name], female   DOB: 09/30/1951, 71 y.o.   MRN: [id number] Patient ID: Diamond [name], female   DOB: 05-05-1951, 71 y.o.   MRN: [id number]

## 2021-12-22 NOTE — TOC Initial Note (Signed)
Transition of Care [facility]) - Initial/Assessment Note    Patient Details  Name: Diamond [name] MRN: [id number] Date of Birth: January 26, 1951  Transition of Care [facility]) CM/SW Contact:    Benard Halsted, LCSW Phone Number: 12/22/2021, 4:21 PM  Clinical Narrative:                 CSW received consult for possible SNF placement at time of discharge. CSW spoke with patient. Patient reported that she lives alone and will need rehab to be able to return home. Patient expressed understanding of PT recommendation and is agreeable to SNF placement at time of discharge. CSW discussed insurance authorization process and will provide Medicare SNF ratings list. Patient has received COVID vaccines. CSW will send out referrals for review to see which facilities can transport patient to dialysis.    Skilled Nursing Rehab Facilities-   [email or web address]   Ratings out of 5 possible   Name Address  Phone # [place] Inspection Overall  [facility] [address] [place] 5 5 2 4   [facility]  [address], [place] [phone] 4 2 5 5   [facility] [place], [address] 4 1 1 1   [place], [place] 2 2 4 4   [facility] [address], [place] 2 1 2 1   [facility] [facility] [address] [place] 3 1 4 3   [facility] [address], [place] 5 2 2 3   [facility] [address], [place] 4 1 2 1   [address] ([place]) [place], [address] [phone] 5 1 2 2   [facility] [phone], [address] [phone] 4 1 1 1   [facility] [address] [facility] [phone] 4 1 2 1   [facility] [place]. [address], [place] [phone] 4 1 1 1           [place], [place] [address] [place] 4 2 3 3   [facility] [place] [address], [place] 3 1 5 4   [place], [place] [phone] 2 1 1 1   [facility] [address], [place] [phone] 2 2 3 3           [address] (no [facility]) [place] [address], [place] [phone] 4 5 5 5   [facility] (No Humana) [address] [phone] [address], [place] 4 1 4 3   [facility] (No UHC) [address] [place] 5 5 5 5   [facility] [address] [place] [phone] 3 3 4 4   [place] [address], [place]  [phone] 2 2 2 2   [phone] [address] [place] 3 1 3 2   [place] [address] [place] 1 1 2 1   [facility] [address], [address] [place] 2 1 1 1   [place], [place] 5 2 4 5   [facility] [address] [place] 2 1 1 1   [facility] [address] [place] 3 1 1 1   [facility] [place] 2 1 2 1           [facility] [place] [address], [place] [phone] 5 3 3 4   [address], [place] 2 1 1 1   [place] (No Humana) [address], [place] [phone] 2 1 2 1   [facility] [address], [place] [phone] 3 1 1 1           [facility] [place], [place] 4 1 5 4   [facility]) [place], [place] 2 1 3 2   [facility]) [place], [place] [phone] 3 1 4 3   [facility] [address], [place] 4 1 4 3   [address] [place], [place] 3 3 1 1   [facility]) [address] [place] [phone] 3 2 3 3      Expected Discharge Plan:  [place] Barriers to Discharge: Continued Medical Work up, Ship broker, SNF Pending bed offer   Patient Goals and CMS Choice Patient states their goals for this hospitalization and ongoing recovery are:: Rehab CMS Medicare.gov Compare Post Acute Care list provided to:: Patient Choice offered to / list presented to : Patient  Expected Discharge Plan and Services Expected Discharge Plan: [place] In-house Referral: Clinical Social Work   Post Acute Care Choice: [place] Living arrangements for the past 2 months: [place]                                      Prior Living Arrangements/Services Living arrangements for the past 2 months: Single Family Home Lives with:: Self Patient language and need for interpreter reviewed:: Yes Do you feel safe going back to the place where you live?: Yes      Need for Family Participation in Patient Care: Yes (Comment) Care giver support system in place?: No (comment)   Criminal Activity/Legal Involvement Pertinent to Current Situation/Hospitalization: No - Comment as needed  Activities of Daily Living      Permission Sought/Granted Permission sought to share information with : Facility Sport and exercise psychologist, Family Supports       Permission granted to share info w AGENCY: SNFs        Emotional Assessment Appearance:: Appears stated age Attitude/Demeanor/Rapport: Engaged Affect (typically observed): Accepting, Appropriate Orientation: : Oriented to Self, Oriented to Place, Oriented to  Time, Oriented to Situation Alcohol / Substance Use: Not Applicable Psych Involvement: No (comment)  Admission diagnosis:  Lactic acidosis [E87.20] Back pain [M54.9] Leukocytosis [D72.829] Patient Active Problem List   Diagnosis Date Noted   Vertebral osteomyelitis ([place])    Leukocytosis 12/17/2021   Severe sepsis ([place]) 12/17/2021   Lactic acidosis 12/17/2021   Low back  pain 12/17/2021   ESRD (end stage renal disease) ([place]) 12/17/2021   Mild intermittent asthma 12/17/2021   Vitamin D deficiency 01/02/2021   Hyperparathyroidism ([place]) 12/31/2020   CKD (chronic kidney disease), stage III ([place]) 11/10/2019   Cardiomegaly 11/05/2019   Hyperkalemia 11/05/2019   Hypokalemia 10/18/2019   Pneumonia due to COVID-19 virus 10/17/2019   CAP (community acquired pneumonia) 10/16/2019   Hypertension    Asthma    Gout    AKI (acute kidney injury) ([place])    Hyponatremia    Anemia of chronic disease    PCP:  [name], MD Pharmacy:   [facility] [place] [facility] [address] [place] [address] Phone: [phone] Fax: [phone]  Social Determinants of Health (SDOH) Interventions    Readmission Risk Interventions No flowsheet data found.

## 2021-12-22 NOTE — Progress Notes (Addendum)
[facility] [place]  Subjective  - awaiting TEE   Objective 124/76 86 98.9 F (37.2 C) (Oral) 16 95%  Intake/Output Summary (Last 24 hours) at 12/22/2021 0813 Last data filed at 12/22/2021 0449 Gross per 24 hour  Intake 240 ml  Output 150 ml  Net 90 ml   Palpable radial pulses B UE Lungs non labored breathing Right TDC removal  dressing clean and dry     Assessment/Planning:  ESRD with bacteremia + MRSA Right TDC removed 12/19/21 L5-S1 osteomyelitis ID suggested continue vancomycin; anticipate at least 8 weeks antibiotics in setting of vertebral infection. Pending TEE  Plan NPO past MN for placement of TDC and permament access left first stage basilic fistula.  Roxy Horseman 12/22/2021 8:13 AM --  Laboratory Lab Results: No results for input(s): WBC, HGB, HCT, PLT in the last 72 hours. BMET Recent Labs    12/21/21 0645  NA 128*  K 3.7  CL 93*  CO2 23  GLUCOSE 92  BUN 55*  CREATININE 6.58*  CALCIUM 9.2    COAG Lab Results  Component Value Date   INR 1.0 10/16/2019   No results found for: PTT  VASCULAR STAFF ADDENDUM: I agree with the above.   Yevonne Aline. Stanford Breed, MD [facility] Phone Number: [phone] 12/22/2021 12:01 PM

## 2021-12-22 NOTE — Progress Notes (Signed)
Occupational Therapy Treatment Patient Details Name: Diamond [name] MRN: [id number] DOB: 02-27-1951 Today's Date: 12/22/2021   History of present illness Pt is a 71 y.o. F who presents 12/16/2021 with severe sepsis after presenting from home complaining of right sided back pain. Blood culture showing MRSA. 2D echo significant for possible mitral valve vegetation. Lumbar/thoracic spine MRI significant for discitis/osteomyelitis. Significant PMH: ESRD, asthma, HTN.   OT comments  Diamond [name] is making incremental progress. Overall, she required more cognitive assist this session for sequencing, initiation of movement and STM. Upon arrival pt was soiled, but unaware - ADL session completed at bed level with max A required for rolling and LB bathing, mod A for donning a gown. Pt also continues to require significantly increased time for pain management. OT to continue to follow acutely. D/c recommendation continues to be appropriate.    Recommendations for follow up therapy are one component of a multi-disciplinary discharge planning process, led by the attending physician.  Recommendations may be updated based on patient status, additional functional criteria and insurance authorization.    Follow Up Recommendations  Skilled nursing-short term rehab (<3 hours/day)    Assistance Recommended at Discharge Frequent or constant Supervision/Assistance  Patient can return home with the following  A lot of help with walking and/or transfers;A lot of help with bathing/dressing/bathroom;Assistance with cooking/housework;Assist for transportation;Help with stairs or ramp for entrance   Equipment Recommendations  BSC/3in1;Wheelchair (measurements OT)       Precautions / Restrictions Precautions Precautions: Fall Restrictions Weight Bearing Restrictions: No       Mobility Bed Mobility Overal bed mobility: Needs Assistance Bed Mobility: Rolling Rolling: Max assist         General bed mobility  comments: max A for initiation of movement at each body part, pt requiring more cognitive asssit this session    Transfers Overall transfer level: Needs assistance                 General transfer comment: deferred this session     ADL either performed or assessed with clinical judgement   ADL Overall ADL's : Needs assistance/impaired             Lower Body Bathing: Maximal assistance;Bed level Lower Body Bathing Details (indicate cue type and reason): pt not following commands this session, max A for LB bathing at bed level Upper Body Dressing : Moderate assistance;Bed level Upper Body Dressing Details (indicate cue type and reason): mod A for cues this session, physical assist for initiation       Toilet Transfer Details (indicate cue type and reason): pt unaware of being soiled in bed Toileting- Clothing Manipulation and Hygiene: Maximal assistance;Bed level       Functional mobility during ADLs: Maximal assistance (at bed level) General ADL Comments: continues to required significantly icnreased time for pain management. increased cognitive assist required this session. ADL session at bed level.    Extremity/Trunk Assessment Upper Extremity Assessment Upper Extremity Assessment: Generalized weakness   Lower Extremity Assessment Lower Extremity Assessment: Defer to PT evaluation        Vision   Vision Assessment?: No apparent visual deficits   Perception Perception Perception: Not tested   Praxis Praxis Praxis: Not tested    Cognition Arousal/Alertness: Awake/alert Behavior During Therapy: WFL for tasks assessed/performed, Anxious Overall Cognitive Status: Impaired/Different from baseline Area of Impairment: Following commands, Safety/judgement, Awareness, Problem solving, Memory  Memory: Decreased short-term memory Following Commands: Follows one step commands inconsistently Safety/Judgement: Decreased awareness of  safety Awareness: Intellectual Problem Solving: Slow processing, Decreased initiation, Requires verbal cues General Comments: pt with STM impairement this session, did not remeber leaving the floor for TEE, and at the end of the session stated "when will I go back to my room?" not recognizing room even with verbal cues. Pt also soiled in bed, not aware upon arrival. Unable to sequence bed mobility, requiring max A for all initiation.              General Comments VSS on RA.    Pertinent Vitals/ Pain       Pain Assessment Pain Assessment: Faces Faces Pain Scale: Hurts whole lot Pain Location: back Pain Descriptors / Indicators: Grimacing, Sharp Pain Intervention(s): Limited activity within patient's tolerance, Monitored during session   Frequency  Min 2X/week        Progress Toward Goals  OT Goals(current goals can now be found in the care plan section)  Progress towards OT goals: Progressing toward goals  Acute Rehab OT Goals Patient Stated Goal: less pain OT Goal Formulation: With patient Time For Goal Achievement: 01/03/22 Potential to Achieve Goals: Good ADL Goals Pt Will Perform Grooming: with modified independence;standing Pt Will Perform Lower Body Dressing: with modified independence;sit to/from stand Pt Will Transfer to Toilet: with modified independence;ambulating Additional ADL Goal #1: Pt will indep verbalize at least 3 energy conservation strategies to apply to the home setting  Plan Discharge plan remains appropriate       AM-PAC OT "6 Clicks" Daily Activity     Outcome Measure   Help from another person eating meals?: None Help from another person taking care of personal grooming?: A Little Help from another person toileting, which includes using toliet, bedpan, or urinal?: A Lot Help from another person bathing (including washing, rinsing, drying)?: A Lot Help from another person to put on and taking off regular upper body clothing?: A Lot Help from  another person to put on and taking off regular lower body clothing?: A Lot 6 Click Score: 15    End of Session    OT Visit Diagnosis: Unsteadiness on feet (R26.81);Other abnormalities of gait and mobility (R26.89);Muscle weakness (generalized) (M62.81);Pain   Activity Tolerance Patient tolerated treatment well   Patient Left in bed;with call bell/phone within reach;with bed alarm set   Nurse Communication Mobility status        Time: 5397-6734 OT Time Calculation (min): 21 min  Charges: OT General Charges $OT Visit: 1 Visit OT Treatments $Self Care/Home Management : 8-22 mins   Mikka Kissner A Kunta Hilleary 12/22/2021, 5:13 PM

## 2021-12-22 NOTE — Transfer of Care (Signed)
Immediate Anesthesia Transfer of Care Note  Patient: Diamond [name]  Procedure(s) Performed: TRANSESOPHAGEAL ECHOCARDIOGRAM (TEE)  Patient Location: Endoscopy Unit  Anesthesia Type:MAC  Level of Consciousness: drowsy and patient cooperative  Airway & Oxygen Therapy: Patient Spontanous Breathing and Patient connected to nasal cannula oxygen  Post-op Assessment: Report given to RN and Post -op Vital signs reviewed and stable  Post vital signs: Reviewed and stable  Last Vitals:  Vitals Value Taken Time  BP 117/76   Temp    Pulse 73 12/22/21 1427  Resp 16 12/22/21 1427  SpO2 96 % 12/22/21 1427  Vitals shown include unvalidated device data.  Last Pain:  Vitals:   12/22/21 1230  TempSrc: Temporal  PainSc: 0-No pain      Patients Stated Pain Goal: 3 (52/84/13 2440)  Complications: No notable events documented.

## 2021-12-22 NOTE — Plan of Care (Signed)

## 2021-12-22 NOTE — Progress Notes (Signed)
Diamond [name] for Infectious Disease  Date of Admission:  12/16/2021     Principal Problem:   Severe sepsis [facility]) Active Problems:   Hypertension   Anemia of chronic disease   Leukocytosis   Lactic acidosis   Low back pain   ESRD (end stage renal disease) (HCC)   Mild intermittent asthma   Vertebral osteomyelitis (HCC)       Abx: 1/12-c vanc      Assessment: 70 YF with ESRD on HD via right IJ tunneled catheter admitted for sepsis 2/2 suspect UTI. Urine Cx showed no growth. Found to have MRSA bacteremia.  #MRSA bacteremia #L5-S1 osteomyelitis #ESRD on HD via RIJ SP removal on 1/14 1/11 blood Cx+ MRSA, 1/13 blood Cx NGTD TTE did not show mitral valve thickening/fibrinous density MRI showed concern for L5-S1 discitis/osteomyelitis -- no obvious abscess, but mentioned also mild right psoas edema  -Pt had RIJ TDC removed on 1/14; noted vascular surgery planned new line tomorrow 1/18 -one isolated fever 1/16 but clinically unchanged -- I do not see need to repeat blood cx or delay line placement -Source of bacteremia unclear but strong suspicion for endocarditis given MRSA bacteremia in an hemodialysis pt via IJ and vertebral osteomyelitis(likely septic emboli).    #Rt sided pulmonary nodule on CT  in 2011  -Follow-up imaging per primary/pulm   Recommendations: -continue vancomycin -await tee today -new dialysis line placement tomorrow 1/18 -discussed with primary team      Microbiology:     SUBJECTIVE: Fever 1/16 No new focal pain No cough No n/v/diarrhea No rash Continues to feel well since yesterday   Review of Systems: Review of Systems  All other systems reviewed and are negative.   Scheduled Meds:  Chlorhexidine Gluconate Cloth  6 each Topical Q0600   heparin injection (subcutaneous)  5,000 Units Subcutaneous Q8H   mupirocin ointment  1 application Nasal BID   polyethylene glycol  34 g Oral Daily   senna-docusate  2 tablet Oral  BID   verapamil  120 mg Oral Q8H   Continuous Infusions:  sodium chloride 20 mL/hr at 12/21/21 2229   vancomycin     PRN Meds:.acetaminophen **OR** acetaminophen, albuterol, naLOXone (NARCAN)  injection, oxyCODONE, polyethylene glycol No Known Allergies  OBJECTIVE: Vitals:   12/21/21 2000 12/22/21 0033 12/22/21 0447 12/22/21 0751  BP: 124/79 134/83 (!) 142/93 124/76  Pulse: 82 93 91 86  Resp: 13 20 18 16   Temp: 98.9 F (37.2 C) 99.4 F (37.4 C) 98.4 F (36.9 C) 98.9 F (37.2 C)  TempSrc: Oral Oral Oral Oral  SpO2: 98% 95% 98% 95%  Weight:      Height:       Body mass index is 22.27 kg/m.  Physical exam: General/constitutional: no distress, pleasant HEENT: Normocephalic, EOMI, Oropharynx clear CV: rrr no mrg Lungs: clear to auscultation, normal respiratory effort Abd: Soft, Nontender Ext: no edema Skin: No Rash Neuro: nonfocal MSK: no peripheral joint swelling/tenderness/warmth       Lab Results Lab Results  Component Value Date   WBC 12.2 (H) 12/19/2021   HGB 10.4 (L) 12/19/2021   HCT 30.3 (L) 12/19/2021   MCV 92.9 12/19/2021   PLT 220 12/19/2021    Lab Results  Component Value Date   CREATININE 6.58 (H) 12/21/2021   BUN 55 (H) 12/21/2021   NA 128 (L) 12/21/2021   K 3.7 12/21/2021   CL 93 (L) 12/21/2021   CO2 23 12/21/2021  Lab Results  Component Value Date   ALT 8 12/17/2021   AST 11 (L) 12/17/2021   ALKPHOS 80 12/17/2021   BILITOT 1.1 12/17/2021     Imaging: I reviewed imaging and incorporate finding into assessment/plan   1/13 lumbar and thoracic mri non-con Endplate irregularity and marrow edema with disc edema at L5-S1. Mild right psoas edema. Some ill-defined signal in the ventral epidural space above and below this disc level. This may be degenerative in etiology or reflect discitis/osteomyelitis.   Degenerative changes result in moderate to marked canal stenosis at L4-L5 with narrowing of subarticular recesses.  Jabier Mutton,  [place] for Infectious Disease [facility] 12/22/2021, 10:42 AM

## 2021-12-22 NOTE — Progress Notes (Signed)
°  Echocardiogram Echocardiogram Transesophageal has been performed.  Fidel Levy 12/22/2021, 3:04 PM

## 2021-12-22 NOTE — Progress Notes (Signed)
[facility] Progress Note   Subjective:  Seen in room. Had a good night. Pain controlled. No complaints this am.    Objective Vitals:   12/21/21 2000 12/22/21 0033 12/22/21 0447 12/22/21 0751  BP: 124/79 134/83 (!) 142/93 124/76  Pulse: 82 93 91 86  Resp: 13 20 18 16   Temp: 98.9 F (37.2 C) 99.4 F (37.4 C) 98.4 F (36.9 C) 98.9 F (37.2 C)  TempSrc: Oral Oral Oral Oral  SpO2: 98% 95% 98% 95%  Weight:      Height:         Additional Objective Labs: Basic Metabolic Panel: Recent Labs  Lab 12/18/21 0736 12/19/21 0630 12/21/21 0645  NA 132* 132* 128*  K 3.5 3.4* 3.7  CL 98 93* 93*  CO2 21* 21* 23  GLUCOSE 93 101* 92  BUN 40* 60* 55*  CREATININE 5.93* 6.98* 6.58*  CALCIUM 8.9 9.5 9.2  PHOS 5.8* 5.9* 7.1*    CBC: Recent Labs  Lab 12/16/21 1450 12/17/21 0554 12/19/21 0644  WBC 20.5* 20.3* 12.2*  NEUTROABS 19.0* 18.3*  --   HGB 12.5 11.3* 10.4*  HCT 39.0 36.0 30.3*  MCV 97.5 100.8* 92.9  PLT 210 153 220    Blood Culture    Component Value Date/Time   SDES BLOOD RIGHT HAND 12/19/2021 0643   SPECREQUEST  12/19/2021 0643    BOTTLES DRAWN AEROBIC ONLY Blood Culture results may not be optimal due to an inadequate volume of blood received in culture bottles   CULT  12/19/2021 0643    NO GROWTH 3 DAYS Performed at [facility], [place] [address], [place],  [address]    REPTSTATUS PENDING 12/19/2021 0102     Physical Exam General: Alert, lying in bed, nad  Heart: RRR Lungs: Clear bilaterally Abdomen: soft non-tender  Extremities: No LE edema  Dialysis Access: [facility] removed   Medications:  sodium chloride 20 mL/hr at 12/21/21 2229   vancomycin      Chlorhexidine Gluconate Cloth  6 each Topical Q0600   heparin injection (subcutaneous)  5,000 Units Subcutaneous Q8H   mupirocin ointment  1 application Nasal BID   polyethylene glycol  34 g Oral Daily   senna-docusate  2 tablet Oral BID   verapamil  120 mg Oral Q8H     Dialysis Orders:  TTS HD at [facility]  4h  400/600  68.5kg  3/2 bath  Hep 2000    - hect 4 ug tiw  - venofer 50 q wk  - no esa, last Hb 11.9  Assessment/Plan: Sepsis/ MRSA bacteremia - 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 As well concern for vertebral osteo.  ID recommended IV vanc and line holiday.  Noted TTE showed MV vegetation, recommended TEE.  Repeat blood Cx 1/14 neg to date. TDC removed by [facility] on 1/14 for line holiday, w/ plans new [facility] and possibly AVF on 1/18  if [facility] remain negative. Anticipate 8 weeks of antibiotics. ESRD -HD TTS. Last HD on Sat 1/14. Line holiday as above. No urgent dialysis indications today. Plan for next HD Wed 1/18.  HTN/volume -BP acceptable.  Post HD wt 65kg  on 1/14.  Anemia -Hgb 10.4 <11.5 no ESA needs, DC weekly Venofer with sepsis Secondary hyperparathyroidism -Corrected Ca elevated. Phos not at goal.  Continue binders ,hold vitamin D with ^ Ca ,used 2.0 calcium bath  Right-sided pulmonary nodule.  follow-up intervention per primary/pulm Lower back pain.  Possible osteo-/discitis on MRI LS. MRI LS = L5/S1 degenerative changes  possibly, but IR said to small to sample, and neurosurg looked at this and didn't think it was spine infection.  pain management per admit  Lynnda Child PA-C [facility] 12/22/2021,9:59 AM

## 2021-12-22 NOTE — Progress Notes (Signed)
PT Cancellation Note  Patient Details Name: Diamond [name] MRN: [id number] DOB: 10-May-1951   Cancelled Treatment:    Reason Eval/Treat Not Completed: Patient at procedure or test/unavailable  Patient off the unit.   Arby Barrette, PT Acute Rehabilitation Services  Pager [phone] Office [phone]   Rexanne Mano 12/22/2021, 1:19 PM

## 2021-12-23 ENCOUNTER — Inpatient Hospital Stay (HOSPITAL_COMMUNITY): Payer: Medicare HMO

## 2021-12-23 ENCOUNTER — Encounter (HOSPITAL_COMMUNITY)
Admission: EM | Disposition: A | Discharge status: Skilled Nursing Facility | Payer: Self-pay | Source: Home / Self Care | Attending: Internal Medicine

## 2021-12-23 ENCOUNTER — Encounter (HOSPITAL_COMMUNITY): Payer: Self-pay | Admitting: Internal Medicine

## 2021-12-23 ENCOUNTER — Ambulatory Visit: Admission: RE | Admit: 2021-12-23 | Payer: Medicare HMO | Source: Home / Self Care | Admitting: Vascular Surgery

## 2021-12-23 ENCOUNTER — Other Ambulatory Visit (HOSPITAL_COMMUNITY): Payer: Self-pay

## 2021-12-23 ENCOUNTER — Inpatient Hospital Stay (HOSPITAL_COMMUNITY): Payer: Medicare HMO | Admitting: Anesthesiology

## 2021-12-23 ENCOUNTER — Encounter (HOSPITAL_COMMUNITY): Payer: Self-pay

## 2021-12-23 DIAGNOSIS — M4620 Osteomyelitis of vertebra, site unspecified: Secondary | ICD-10-CM | POA: Diagnosis not present

## 2021-12-23 DIAGNOSIS — R7881 Bacteremia: Secondary | ICD-10-CM

## 2021-12-23 DIAGNOSIS — D638 Anemia in other chronic diseases classified elsewhere: Secondary | ICD-10-CM | POA: Diagnosis not present

## 2021-12-23 DIAGNOSIS — Z992 Dependence on renal dialysis: Secondary | ICD-10-CM

## 2021-12-23 DIAGNOSIS — B9562 Methicillin resistant Staphylococcus aureus infection as the cause of diseases classified elsewhere: Secondary | ICD-10-CM | POA: Diagnosis present

## 2021-12-23 DIAGNOSIS — R911 Solitary pulmonary nodule: Secondary | ICD-10-CM

## 2021-12-23 DIAGNOSIS — A4902 Methicillin resistant Staphylococcus aureus infection, unspecified site: Secondary | ICD-10-CM | POA: Diagnosis not present

## 2021-12-23 DIAGNOSIS — N186 End stage renal disease: Secondary | ICD-10-CM | POA: Diagnosis not present

## 2021-12-23 DIAGNOSIS — M4627 Osteomyelitis of vertebra, lumbosacral region: Secondary | ICD-10-CM

## 2021-12-23 DIAGNOSIS — A419 Sepsis, unspecified organism: Secondary | ICD-10-CM | POA: Diagnosis not present

## 2021-12-23 HISTORY — PX: ULTRASOUND GUIDANCE FOR VASCULAR ACCESS: SHX6516

## 2021-12-23 HISTORY — PX: AV FISTULA PLACEMENT: SHX1204

## 2021-12-23 HISTORY — PX: INSERTION OF DIALYSIS CATHETER: SHX1324

## 2021-12-23 LAB — BASIC METABOLIC PANEL
Anion gap: 14 (ref 5–15)
BUN: 74 mg/dL — ABNORMAL HIGH (ref 8–23)
CO2: 21 mmol/L — ABNORMAL LOW (ref 22–32)
Calcium: 9.4 mg/dL (ref 8.9–10.3)
Chloride: 93 mmol/L — ABNORMAL LOW (ref 98–111)
Creatinine, Ser: 8.04 mg/dL — ABNORMAL HIGH (ref 0.44–1.00)
GFR, Estimated: 5 mL/min — ABNORMAL LOW (ref >60)
Glucose, Bld: 92 mg/dL (ref 70–99)
Potassium: 4.2 mmol/L (ref 3.5–5.1)
Sodium: 128 mmol/L — ABNORMAL LOW (ref 135–145)

## 2021-12-23 LAB — CBC
HCT: 28.4 % — ABNORMAL LOW (ref 36.0–46.0)
Hemoglobin: 9.7 g/dL — ABNORMAL LOW (ref 12.0–15.0)
MCH: 31.8 pg (ref 26.0–34.0)
MCHC: 34.2 g/dL (ref 30.0–36.0)
MCV: 93.1 fL (ref 80.0–100.0)
Platelets: 169 10*3/uL (ref 150–400)
RBC: 3.05 MIL/uL — ABNORMAL LOW (ref 3.87–5.11)
RDW: 17.5 % — ABNORMAL HIGH (ref 11.5–15.5)
WBC: 6.9 10*3/uL (ref 4.0–10.5)
nRBC: 0 % (ref 0.0–0.2)

## 2021-12-23 SURGERY — ARTERIOVENOUS (AV) FISTULA CREATION
Anesthesia: Monitor Anesthesia Care | Laterality: Right

## 2021-12-23 MED ORDER — PROPOFOL 500 MG/50ML IV EMUL
INTRAVENOUS | Status: DC | PRN
  Administered 2021-12-23: 50 ug/kg/min via INTRAVENOUS

## 2021-12-23 MED ORDER — CEFAZOLIN SODIUM-DEXTROSE 2-4 GM/100ML-% IV SOLN
INTRAVENOUS | Status: AC
  Filled 2021-12-23: qty 100

## 2021-12-23 MED ORDER — ORAL CARE MOUTH RINSE: 15.0000 mL | Freq: Once | OROMUCOSAL | Status: AC

## 2021-12-23 MED ORDER — LABETALOL HCL 5 MG/ML IV SOLN
INTRAVENOUS | Status: AC
  Administered 2021-12-23: 10 mg
  Filled 2021-12-23: qty 4

## 2021-12-23 MED ORDER — CHLORHEXIDINE GLUCONATE 0.12 % MT SOLN: 15.0000 mL | Freq: Once | OROMUCOSAL | Status: AC

## 2021-12-23 MED ORDER — 0.9 % SODIUM CHLORIDE (POUR BTL) OPTIME
TOPICAL | Status: DC | PRN
  Administered 2021-12-23: 1000 mL

## 2021-12-23 MED ORDER — DARBEPOETIN ALFA 40 MCG/0.4ML IJ SOSY
40.0000 ug | PREFILLED_SYRINGE | INTRAMUSCULAR | Status: DC
  Administered 2021-12-24: 40 ug via INTRAVENOUS
  Filled 2021-12-23 (×2): qty 0.4

## 2021-12-23 MED ORDER — LABETALOL HCL 5 MG/ML IV SOLN
10.0000 mg | Freq: Once | INTRAVENOUS | Status: AC
Start: 2021-12-23 — End: 2021-12-23
  Administered 2021-12-23: 10 mg via INTRAVENOUS

## 2021-12-23 MED ORDER — SODIUM CHLORIDE 0.9 % IV SOLN: INTRAVENOUS | Status: DC

## 2021-12-23 MED ORDER — HEPARIN 6000 UNIT IRRIGATION SOLUTION
Status: DC | PRN
  Administered 2021-12-23: 1

## 2021-12-23 MED ORDER — LIDOCAINE-EPINEPHRINE (PF) 1 %-1:200000 IJ SOLN
INTRAMUSCULAR | Status: DC | PRN
  Administered 2021-12-23: 10 mL

## 2021-12-23 MED ORDER — FENTANYL CITRATE (PF) 100 MCG/2ML IJ SOLN: 50.0000 ug | Freq: Once | INTRAMUSCULAR | Status: AC

## 2021-12-23 MED ORDER — HEPARIN SODIUM (PORCINE) 1000 UNIT/ML IJ SOLN
INTRAMUSCULAR | Status: DC | PRN
  Administered 2021-12-23: 3200 [IU]

## 2021-12-23 MED ORDER — ACETAMINOPHEN 500 MG PO TABS
1000.0000 mg | ORAL_TABLET | Freq: Once | ORAL | Status: AC
  Administered 2021-12-23: 1000 mg via ORAL
  Filled 2021-12-23: qty 2

## 2021-12-23 MED ORDER — LABETALOL HCL 5 MG/ML IV SOLN
5.0000 mg | INTRAVENOUS | Status: AC | PRN
  Administered 2021-12-23 (×2): 5 mg via INTRAVENOUS

## 2021-12-23 MED ORDER — PROPOFOL 10 MG/ML IV BOLUS
INTRAVENOUS | Status: DC | PRN
  Administered 2021-12-23: 20 mg via INTRAVENOUS

## 2021-12-23 MED ORDER — LABETALOL HCL 5 MG/ML IV SOLN: 10.0000 mg | INTRAVENOUS | Status: DC | PRN

## 2021-12-23 MED ORDER — LIDOCAINE-EPINEPHRINE 1 %-1:100000 IJ SOLN
INTRAMUSCULAR | Status: DC | PRN
Start: 2021-12-23 — End: 2021-12-23
  Administered 2021-12-23: 15 mL

## 2021-12-23 MED ORDER — LABETALOL HCL 5 MG/ML IV SOLN
INTRAVENOUS | Status: AC
  Filled 2021-12-23: qty 4

## 2021-12-23 MED ORDER — FENTANYL CITRATE (PF) 100 MCG/2ML IJ SOLN
INTRAMUSCULAR | Status: AC
  Administered 2021-12-23: 50 ug via INTRAVENOUS
  Filled 2021-12-23: qty 2

## 2021-12-23 MED ORDER — CHLORHEXIDINE GLUCONATE 0.12 % MT SOLN
OROMUCOSAL | Status: AC
  Administered 2021-12-23: 15 mL via OROMUCOSAL
  Filled 2021-12-23: qty 15

## 2021-12-23 MED ORDER — DEXMEDETOMIDINE (PRECEDEX) IN NS 20 MCG/5ML (4 MCG/ML) IV SYRINGE
PREFILLED_SYRINGE | INTRAVENOUS | Status: DC | PRN
  Administered 2021-12-23: 4 ug via INTRAVENOUS
  Administered 2021-12-23: 8 ug via INTRAVENOUS

## 2021-12-23 MED ORDER — MEPIVACAINE HCL (PF) 1.5 % IJ SOLN
INTRAMUSCULAR | Status: DC | PRN
Start: 2021-12-23 — End: 2021-12-23
  Administered 2021-12-23: 10 mL via PERINEURAL

## 2021-12-23 SURGICAL SUPPLY — 62 items
ADH SKN CLS APL DERMABOND .7 (GAUZE/BANDAGES/DRESSINGS) ×6
APL PRP STRL LF DISP 70% ISPRP (MISCELLANEOUS) ×3
APL SKNCLS STERI-STRIP NONHPOA (GAUZE/BANDAGES/DRESSINGS)
ARMBAND PINK RESTRICT EXTREMIT (MISCELLANEOUS) ×5 IMPLANT
BAG DECANTER FOR FLEXI CONT (MISCELLANEOUS) ×3 IMPLANT
BENZOIN TINCTURE PRP APPL 2/3 (GAUZE/BANDAGES/DRESSINGS) ×3 IMPLANT
BIOPATCH RED 1 DISK 7.0 (GAUZE/BANDAGES/DRESSINGS) ×4 IMPLANT
BIOPATCH RED 1IN DISK 7.0MM (GAUZE/BANDAGES/DRESSINGS) ×1
CANISTER SUCT 3000ML PPV (MISCELLANEOUS) ×5 IMPLANT
CANNULA VESSEL 3MM 2 BLNT TIP (CANNULA) ×5 IMPLANT
CATH PALINDROME-P 19CM W/VT (CATHETERS) ×2 IMPLANT
CATH PALINDROME-P 23CM W/VT (CATHETERS) IMPLANT
CATH PALINDROME-P 28CM W/VT (CATHETERS) IMPLANT
CHLORAPREP W/TINT 26 (MISCELLANEOUS) ×5 IMPLANT
CLIP LIGATING EXTRA MED SLVR (CLIP) ×5 IMPLANT
CLIP LIGATING EXTRA SM BLUE (MISCELLANEOUS) ×5 IMPLANT
CLOSURE WOUND 1/2 X4 (GAUZE/BANDAGES/DRESSINGS)
COVER PROBE W GEL 5X96 (DRAPES) ×7 IMPLANT
COVER SURGICAL LIGHT HANDLE (MISCELLANEOUS) ×5 IMPLANT
DERMABOND ADVANCED (GAUZE/BANDAGES/DRESSINGS) ×4
DERMABOND ADVANCED .7 DNX12 (GAUZE/BANDAGES/DRESSINGS) IMPLANT
DRAPE C-ARM 42X72 X-RAY (DRAPES) ×5 IMPLANT
DRAPE CHEST BREAST 15X10 FENES (DRAPES) ×5 IMPLANT
ELECT REM PT RETURN 9FT ADLT (ELECTROSURGICAL) ×5
ELECTRODE REM PT RTRN 9FT ADLT (ELECTROSURGICAL) ×3 IMPLANT
GAUZE 4X4 16PLY ~~LOC~~+RFID DBL (SPONGE) ×5 IMPLANT
GAUZE SPONGE 4X4 12PLY STRL (GAUZE/BANDAGES/DRESSINGS) ×3 IMPLANT
GLOVE SURG POLYISO LF SZ8 (GLOVE) ×5 IMPLANT
GOWN STRL REUS W/ TWL LRG LVL3 (GOWN DISPOSABLE) ×6 IMPLANT
GOWN STRL REUS W/ TWL XL LVL3 (GOWN DISPOSABLE) ×3 IMPLANT
GOWN STRL REUS W/TWL LRG LVL3 (GOWN DISPOSABLE) ×10
GOWN STRL REUS W/TWL XL LVL3 (GOWN DISPOSABLE) ×5
INSERT FOGARTY SM (MISCELLANEOUS) IMPLANT
KIT BASIN OR (CUSTOM PROCEDURE TRAY) ×5 IMPLANT
KIT PALINDROME-P 55CM (CATHETERS) IMPLANT
KIT TURNOVER KIT B (KITS) ×5 IMPLANT
NDL 18GX1X1/2 (RX/OR ONLY) (NEEDLE) ×3 IMPLANT
NDL HYPO 25GX1X1/2 BEV (NEEDLE) ×3 IMPLANT
NEEDLE 18GX1X1/2 (RX/OR ONLY) (NEEDLE) IMPLANT
NEEDLE HYPO 25GX1X1/2 BEV (NEEDLE) ×5 IMPLANT
NS IRRIG 1000ML POUR BTL (IV SOLUTION) ×5 IMPLANT
PACK CV ACCESS (CUSTOM PROCEDURE TRAY) ×5 IMPLANT
PACK SURGICAL SETUP 50X90 (CUSTOM PROCEDURE TRAY) ×5 IMPLANT
PAD ARMBOARD 7.5X6 YLW CONV (MISCELLANEOUS) ×10 IMPLANT
SET MICROPUNCTURE 5F STIFF (MISCELLANEOUS) ×5 IMPLANT
SOAP 2 % CHG 4 OZ (WOUND CARE) ×3 IMPLANT
SPONGE T-LAP 18X18 ~~LOC~~+RFID (SPONGE) ×2 IMPLANT
STRIP CLOSURE SKIN 1/2X4 (GAUZE/BANDAGES/DRESSINGS) ×3 IMPLANT
SUT ETHILON 3 0 PS 1 (SUTURE) ×5 IMPLANT
SUT MNCRL AB 4-0 PS2 18 (SUTURE) ×7 IMPLANT
SUT PROLENE 6 0 BV (SUTURE) ×5 IMPLANT
SUT VIC AB 3-0 SH 27 (SUTURE) ×5
SUT VIC AB 3-0 SH 27X BRD (SUTURE) ×3 IMPLANT
SYR 10ML LL (SYRINGE) ×5 IMPLANT
SYR 20ML LL LF (SYRINGE) ×10 IMPLANT
SYR 3ML LL SCALE MARK (SYRINGE) IMPLANT
SYR 5ML LL (SYRINGE) ×5 IMPLANT
SYR CONTROL 10ML LL (SYRINGE) ×5 IMPLANT
TOWEL GREEN STERILE (TOWEL DISPOSABLE) ×5 IMPLANT
TOWEL GREEN STERILE FF (TOWEL DISPOSABLE) ×8 IMPLANT
UNDERPAD 30X36 HEAVY ABSORB (UNDERPADS AND DIAPERS) ×5 IMPLANT
WATER STERILE IRR 1000ML POUR (IV SOLUTION) ×5 IMPLANT

## 2021-12-23 NOTE — Progress Notes (Signed)
[facility] Progress Note   Subjective:  Seen in room. Comfortable, no complaints.  R atrial mass on TEE will need AC For catheter today per [facility], then dialysis    Objective Vitals:   12/22/21 2029 12/23/21 0022 12/23/21 0400 12/23/21 0800  BP: 133/86 125/81 (!) 149/92 (!) 127/47  Pulse: 83 78 92 73  Resp: 16 18 20 15   Temp: 98.3 F (36.8 C)  98.7 F (37.1 C) 98.9 F (37.2 C)  TempSrc: Oral Oral Axillary Oral  SpO2: 94% 94% 96% 91%  Weight:      Height:         Additional Objective Labs: Basic Metabolic Panel: Recent Labs  Lab 12/18/21 0736 12/19/21 0630 12/21/21 0645 12/23/21 0126  NA 132* 132* 128* 128*  K 3.5 3.4* 3.7 4.2  CL 98 93* 93* 93*  CO2 21* 21* 23 21*  GLUCOSE 93 101* 92 92  BUN 40* 60* 55* 74*  CREATININE 5.93* 6.98* 6.58* 8.04*  CALCIUM 8.9 9.5 9.2 9.4  PHOS 5.8* 5.9* 7.1*  --     CBC: Recent Labs  Lab 12/16/21 1450 12/17/21 0554 12/19/21 0644 12/23/21 0126  WBC 20.5* 20.3* 12.2* 6.9  NEUTROABS 19.0* 18.3*  --   --   HGB 12.5 11.3* 10.4* 9.7*  HCT 39.0 36.0 30.3* 28.4*  MCV 97.5 100.8* 92.9 93.1  PLT 210 153 220 169    Blood Culture    Component Value Date/Time   SDES BLOOD RIGHT HAND 12/19/2021 0643   SPECREQUEST  12/19/2021 0643    BOTTLES DRAWN AEROBIC ONLY Blood Culture results may not be optimal due to an inadequate volume of blood received in culture bottles   CULT  12/19/2021 0643    NO GROWTH 4 DAYS Performed at [facility] [address] [place], [place] [address]    REPTSTATUS PENDING 12/19/2021 1025     Physical Exam General: Alert, lying in bed, nad  Heart: RRR Lungs: Clear bilaterally Abdomen: soft non-tender  Extremities: No LE edema  Dialysis Access: [facility] removed   Medications:  vancomycin     vancomycin      Chlorhexidine Gluconate Cloth  6 each Topical Q0600   heparin injection (subcutaneous)  5,000 Units Subcutaneous Q8H   mupirocin ointment  1 application Nasal BID    polyethylene glycol  34 g Oral Daily   senna-docusate  2 tablet Oral BID   verapamil  120 mg Oral Q8H    Dialysis Orders:  TTS HD at [facility]  4h  400/600  68.5kg  3/2 bath  Hep 2000    - hect 4 ug tiw  - venofer 50 q wk  - no esa, last Hb 11.9  Assessment/Plan: Sepsis/ MRSA bacteremia - 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 As well concern for vertebral osteo.  ID recommended IV vanc and line holiday. Repeat blood Cx 1/14 neg to date. Noted TTE showed MV vegetation, recommended TEE.   TEE - neg for MV vegetation but R atrial mass seen. Felt to be thrombus related to HD cath and will need 6 months of [facility] TDC removed by [facility] on 1/14 for line holiday, w/ plans new TDC and possibly AVF on 1/18   Anticipate 8 weeks of antibiotics. ESRD -HD TTS. Last HD on Sat 1/14. Line holiday as above.  Plan for next HD 1/18.  HTN/volume -BP acceptable.  Post HD wt 65kg  on 1/14.  Anemia -Hgb 9.7<10.4 <11.5. Resume ESA.  Secondary hyperparathyroidism -Corrected Ca elevated. Phos not  at goal.  Continue binders ,hold vitamin D with ^ Ca ,used 2.0 calcium bath  Right-sided pulmonary nodule.  follow-up intervention per primary/pulm Lower back pain.  Possible osteo-/discitis on MRI LS. Pain management per primary   Lynnda Child PA-C [facility] 12/23/2021,11:22 AM

## 2021-12-23 NOTE — Assessment & Plan Note (Signed)
will need repeat CT chest in 58-month

## 2021-12-23 NOTE — Progress Notes (Signed)
Pt receives out-pt HD at [facility] on TTS. Pt arrives at 10:45 for 11:00 chair time. Pt runs for 4 hrs per clinic. Info provided to CSW due to plans for snf placement. Will assist as needed.   Melven Sartorius Renal Navigator [phone]

## 2021-12-23 NOTE — Anesthesia Procedure Notes (Signed)
Procedure Name: MAC Date/Time: 12/23/2021 4:00 PM Performed by: Erick Colace, CRNA Pre-anesthesia Checklist: Patient identified, Emergency Drugs available, Suction available and Patient being monitored Patient Re-evaluated:Patient Re-evaluated prior to induction Oxygen Delivery Method: Simple face mask Preoxygenation: Pre-oxygenation with 100% oxygen Induction Type: IV induction

## 2021-12-23 NOTE — Progress Notes (Addendum)
PROGRESS NOTE    Diamond [name]  MOL:[id number] DOB: 10-10-1951 DOA: 12/16/2021 PCP: [name], MD   Chief Complaint  Patient presents with   Back Pain    Brief Narrative:  71 year old female with history of ESRD on HD TTS-presenting with sepsis due to MRSA bacteremia.  See below for further details of  Subjective:  Some back pain but lying comfortably in bed.   Objective: Vitals: Blood pressure (!) 127/47, pulse 73, temperature 98.9 F (37.2 C), temperature source Oral, resp. rate 15, height 5\' 7"  (1.702 m), weight 64.5 kg, SpO2 91 %.   Exam: Gen Exam:Alert awake-not in any distress HEENT:atraumatic, normocephalic Chest: B/L clear to auscultation anteriorly CVS:S1S2 regular Abdomen:soft non tender, non distended Extremities:no edema Neurology: Non focal Skin: no rash   CBC Latest Ref Rng & Units 12/23/2021 12/19/2021 12/17/2021  WBC 4.0 - 10.5 K/uL 6.9 12.2(H) 20.3(H)  Hemoglobin 12.0 - 15.0 g/dL 9.7(L) 10.4(L) 11.3(L)  Hematocrit 36.0 - 46.0 % 28.4(L) 30.3(L) 36.0  Platelets 150 - 400 K/uL 169 220 153      Assessment & Plan: Sepsis due to MRSA bacteremia with L5-S1 discitis and endocarditis: Sepsis physiology has resolved-repeat blood cultures negative so far-ID following-we will await final recommendations.  Right atrial/IVC globular mobile mass compatible with thrombotic material/vegetative material seen on TEE on 1/17: Discussed with cardiologist-Dr. Etheleen Nicks a thrombus related to HD catheter-recommends at least 6 months of anticoagulation.  Patient scheduled for vascular procedure today-we will initiate anticoagulation once vascular procedures are completed.  Probably will require Eliquis on discharge.  ESRD on HD TTS: Line holiday in progress-vascular surgery planning TDC/permanent access left first stage basilic fistula on 2/01.  Normocytic anemia: Due to ESRD-iron/Aranesp per nephrology.  Mild intermittent asthma: Stable-continue as needed  bronchodilators.  HTN: BP stable-continue verapamil.  HCTZ on hold.  Hyponatremia: Volume management with HD-follow.  Constipation: Continue laxatives  Right middle lobe lung nodule: will need repeat CT chest in 1-month  DVT prophylaxis: Heparin Code Status: Full Family Communication: None at bedside. Disposition:   Status is: Inpatient  The patient will require care spanning > 2 midnights and should be moved to inpatient because: MRSA bacteremia with endocarditis-not yet stable for discharge.    Consultants:  Renal ID  vascular   Data Reviewed: I have personally reviewed following labs and imaging studies  CBC: Recent Labs  Lab 12/16/21 1450 12/17/21 0554 12/19/21 0644 12/23/21 0126  WBC 20.5* 20.3* 12.2* 6.9  NEUTROABS 19.0* 18.3*  --   --   HGB 12.5 11.3* 10.4* 9.7*  HCT 39.0 36.0 30.3* 28.4*  MCV 97.5 100.8* 92.9 93.1  PLT 210 153 220 169     Basic Metabolic Panel: Recent Labs  Lab 12/17/21 0554 12/18/21 0736 12/19/21 0630 12/21/21 0645 12/23/21 0126  NA 133* 132* 132* 128* 128*  K 5.4* 3.5 3.4* 3.7 4.2  CL 94* 98 93* 93* 93*  CO2 14* 21* 21* 23 21*  GLUCOSE 82 93 101* 92 92  BUN 80* 40* 60* 55* 74*  CREATININE 9.58* 5.93* 6.98* 6.58* 8.04*  CALCIUM 8.9 8.9 9.5 9.2 9.4  MG 1.9  --   --   --   --   PHOS 7.4* 5.8* 5.9* 7.1*  --      GFR: Estimated Creatinine Clearance: 6.2 mL/min (A) (by C-G formula based on SCr of 8.04 mg/dL (H)).  Liver Function Tests: Recent Labs  Lab 12/16/21 1450 12/17/21 0554 12/17/21 1251 12/17/21 1437 12/18/21 0736 12/19/21 0630 12/21/21 0645  AST  15 16 18  11*  --   --   --   ALT 10 8 7 8   --   --   --   ALKPHOS 71 92 67 80  --   --   --   BILITOT 1.1 1.3* 1.6* 1.1  --   --   --   PROT 7.2 6.3* 5.5* 6.7  --   --   --   ALBUMIN 3.0* 2.5* 2.4* 2.5* 2.2* 2.0* 2.0*     CBG: No results for input(s): GLUCAP in the last 168 hours.   Recent Results (from the past 240 hour(s))  Resp Panel by RT-PCR (Flu A&B,  Covid) Nasopharyngeal Swab     Status: None   Collection Time: 12/16/21  6:02 PM   Specimen: Nasopharyngeal Swab; Nasopharyngeal(NP) swabs in vial transport medium  Result Value Ref Range Status   SARS Coronavirus 2 by RT PCR NEGATIVE NEGATIVE Final    Comment: (NOTE) SARS-CoV-2 target nucleic acids are NOT DETECTED.  The SARS-CoV-2 RNA is generally detectable in upper respiratory specimens during the acute phase of infection. The lowest concentration of SARS-CoV-2 viral copies this assay can detect is 138 copies/mL. A negative result does not preclude SARS-Cov-2 infection and should not be used as the sole basis for treatment or other patient management decisions. A negative result may occur with  improper specimen collection/handling, submission of specimen other than nasopharyngeal swab, presence of viral mutation(s) within the areas targeted by this assay, and inadequate number of viral copies(<138 copies/mL). A negative result must be combined with clinical observations, patient history, and epidemiological information. The expected result is Negative.  Fact Sheet for Patients:  EntrepreneurPulse.com.au  Fact Sheet for Healthcare Providers:  IncredibleEmployment.be  This test is no t yet approved or cleared by the [place] FDA and  has been authorized for detection and/or diagnosis of SARS-CoV-2 by FDA under an Emergency Use Authorization (EUA). This EUA will remain  in effect (meaning this test can be used) for the duration of the COVID-19 declaration under Section 564(b)(1) of the Act, 21 U.S.C.section 360bbb-3(b)(1), unless the authorization is terminated  or revoked sooner.       Influenza A by PCR NEGATIVE NEGATIVE Final   Influenza B by PCR NEGATIVE NEGATIVE Final    Comment: (NOTE) The Xpert Xpress SARS-CoV-2/FLU/RSV plus assay is intended as an aid in the diagnosis of influenza from Nasopharyngeal swab specimens and should  not be used as a sole basis for treatment. Nasal washings and aspirates are unacceptable for Xpert Xpress SARS-CoV-2/FLU/RSV testing.  Fact Sheet for Patients: EntrepreneurPulse.com.au  Fact Sheet for Healthcare Providers: IncredibleEmployment.be  This test is not yet approved or cleared by the [place] FDA and has been authorized for detection and/or diagnosis of SARS-CoV-2 by FDA under an Emergency Use Authorization (EUA). This EUA will remain in effect (meaning this test can be used) for the duration of the COVID-19 declaration under Section 564(b)(1) of the Act, 21 U.S.C. section 360bbb-3(b)(1), unless the authorization is terminated or revoked.  Performed at [facility], [place] [address] [place], [place] [address]   Urine Culture     Status: None   Collection Time: 12/16/21 11:49 PM   Specimen: Urine, Clean Catch  Result Value Ref Range Status   Specimen Description URINE, CLEAN CATCH  Final   Special Requests NONE  Final   Culture   Final    NO GROWTH Performed at [facility], [place] [address] [place], [place]  [address]    Report Status 12/17/2021 FINAL  Final  Culture, blood (routine x 2)     Status: Abnormal   Collection Time: 12/17/21  1:14 AM   Specimen: BLOOD LEFT HAND  Result Value Ref Range Status   Specimen Description BLOOD LEFT HAND  Final   Special Requests   Final    BOTTLES DRAWN AEROBIC AND ANAEROBIC Blood Culture adequate volume   Culture  Setup Time   Final    GRAM POSITIVE COCCI IN CLUSTERS IN BOTH AEROBIC AND ANAEROBIC BOTTLES CRITICAL VALUE NOTED.  VALUE IS CONSISTENT WITH PREVIOUSLY REPORTED AND CALLED VALUE.    Culture (A)  Final    STAPHYLOCOCCUS AUREUS SUSCEPTIBILITIES PERFORMED ON PREVIOUS CULTURE WITHIN THE LAST 5 DAYS. Performed at [facility], [place] [address], [place], [place] [address]    Report Status 12/19/2021 FINAL  Final  Culture, blood (routine x 2)     Status: Abnormal    Collection Time: 12/17/21  1:19 AM   Specimen: BLOOD  Result Value Ref Range Status   Specimen Description BLOOD SITE NOT SPECIFIED  Final   Special Requests   Final    BOTTLES DRAWN AEROBIC AND ANAEROBIC Blood Culture results may not be optimal due to an inadequate volume of blood received in culture bottles   Culture  Setup Time   Final    GRAM POSITIVE COCCI IN CLUSTERS IN BOTH AEROBIC AND ANAEROBIC BOTTLES CRITICAL RESULT CALLED TO, READ BACK BY AND VERIFIED WITHLoletha Grayer AMEND PHARMD [id number] 12/17/21 A BROWNING Performed at [facility], [place] [address], [place], [place] [address]    Culture METHICILLIN RESISTANT STAPHYLOCOCCUS AUREUS (A)  Final   Report Status 12/19/2021 FINAL  Final   Organism ID, Bacteria METHICILLIN RESISTANT STAPHYLOCOCCUS AUREUS  Final      Susceptibility   Methicillin resistant staphylococcus aureus - MIC*    CIPROFLOXACIN <=0.5 SENSITIVE Sensitive     ERYTHROMYCIN <=0.25 SENSITIVE Sensitive     GENTAMICIN <=0.5 SENSITIVE Sensitive     OXACILLIN RESISTANT Resistant     TETRACYCLINE >=16 RESISTANT Resistant     VANCOMYCIN <=0.5 SENSITIVE Sensitive     TRIMETH/SULFA <=10 SENSITIVE Sensitive     CLINDAMYCIN <=0.25 SENSITIVE Sensitive     RIFAMPIN <=0.5 SENSITIVE Sensitive     Inducible Clindamycin NEGATIVE Sensitive     * METHICILLIN RESISTANT STAPHYLOCOCCUS AUREUS  Blood Culture ID Panel (Reflexed)     Status: Abnormal   Collection Time: 12/17/21  1:19 AM  Result Value Ref Range Status   Enterococcus faecalis NOT DETECTED NOT DETECTED Final   Enterococcus Faecium NOT DETECTED NOT DETECTED Final   Listeria monocytogenes NOT DETECTED NOT DETECTED Final   Staphylococcus species DETECTED (A) NOT DETECTED Final    Comment: CRITICAL RESULT CALLED TO, READ BACK BY AND VERIFIED WITH: C AMEND PHARMD 1613 12/17/21 A BROWNING    Staphylococcus aureus (BCID) DETECTED (A) NOT DETECTED Final    Comment: Methicillin (oxacillin)-resistant Staphylococcus aureus (MRSA).  MRSA is predictably resistant to beta-lactam antibiotics (except ceftaroline). Preferred therapy is vancomycin unless clinically contraindicated. Patient requires contact precautions if  hospitalized. CRITICAL RESULT CALLED TO, READ BACK BY AND VERIFIED WITH: C AMEND PHARMD [id number] 12/17/21 A BROWNING    Staphylococcus epidermidis NOT DETECTED NOT DETECTED Final   Staphylococcus lugdunensis NOT DETECTED NOT DETECTED Final   Streptococcus species NOT DETECTED NOT DETECTED Final   Streptococcus agalactiae NOT DETECTED NOT DETECTED Final   Streptococcus pneumoniae NOT DETECTED NOT DETECTED Final  Streptococcus pyogenes NOT DETECTED NOT DETECTED Final   A.calcoaceticus-baumannii NOT DETECTED NOT DETECTED Final   Bacteroides fragilis NOT DETECTED NOT DETECTED Final   Enterobacterales NOT DETECTED NOT DETECTED Final   Enterobacter cloacae complex NOT DETECTED NOT DETECTED Final   Escherichia coli NOT DETECTED NOT DETECTED Final   Klebsiella aerogenes NOT DETECTED NOT DETECTED Final   Klebsiella oxytoca NOT DETECTED NOT DETECTED Final   Klebsiella pneumoniae NOT DETECTED NOT DETECTED Final   Proteus species NOT DETECTED NOT DETECTED Final   Salmonella species NOT DETECTED NOT DETECTED Final   Serratia marcescens NOT DETECTED NOT DETECTED Final   Haemophilus influenzae NOT DETECTED NOT DETECTED Final   Neisseria meningitidis NOT DETECTED NOT DETECTED Final   Pseudomonas aeruginosa NOT DETECTED NOT DETECTED Final   Stenotrophomonas maltophilia NOT DETECTED NOT DETECTED Final   Candida albicans NOT DETECTED NOT DETECTED Final   Candida auris NOT DETECTED NOT DETECTED Final   Candida glabrata NOT DETECTED NOT DETECTED Final   Candida krusei NOT DETECTED NOT DETECTED Final   Candida parapsilosis NOT DETECTED NOT DETECTED Final   Candida tropicalis NOT DETECTED NOT DETECTED Final   Cryptococcus neoformans/gattii NOT DETECTED NOT DETECTED Final   Meth resistant mecA/C and MREJ DETECTED (A) NOT  DETECTED Final    Comment: CRITICAL RESULT CALLED TO, READ BACK BY AND VERIFIED WITHLoletha Grayer AMEND PHARMD 1613 12/17/21 A BROWNING Performed at [facility], [address], [place], [place] [address]   MRSA Next Gen by PCR, Nasal     Status: Abnormal   Collection Time: 12/19/21 12:40 AM   Specimen: Nasal Mucosa; Nasal Swab  Result Value Ref Range Status   MRSA by PCR Next Gen DETECTED (A) NOT DETECTED Final    Comment: RESULT CALLED TO, READ BACK BY AND VERIFIED WITH: RN M.ENDISON 12/19/21@5 :45 BY TW (NOTE) The GeneXpert MRSA Assay (FDA approved for NASAL specimens only), is one component of a comprehensive MRSA colonization surveillance program. It is not intended to diagnose MRSA infection nor to guide or monitor treatment for MRSA infections. Test performance is not FDA approved in patients less than 76 years old. Performed at [facility], [place] [address], [place], [place] [address]   Culture, blood (routine x 2)     Status: None (Preliminary result)   Collection Time: 12/19/21  6:32 AM   Specimen: BLOOD  Result Value Ref Range Status   Specimen Description BLOOD LEFT ANTECUBITAL  Final   Special Requests   Final    BOTTLES DRAWN AEROBIC ONLY Blood Culture results may not be optimal due to an inadequate volume of blood received in culture bottles   Culture   Final    NO GROWTH 4 DAYS Performed at [facility], [place] [address], [place], [place] [address]    Report Status PENDING  Incomplete  Culture, blood (routine x 2)     Status: None (Preliminary result)   Collection Time: 12/19/21  6:43 AM   Specimen: BLOOD RIGHT HAND  Result Value Ref Range Status   Specimen Description BLOOD RIGHT HAND  Final   Special Requests   Final    BOTTLES DRAWN AEROBIC ONLY Blood Culture results may not be optimal due to an inadequate volume of blood received in culture bottles   Culture   Final    NO GROWTH 4 DAYS Performed at [facility], [place] [address], [place],  [place] [address]    Report Status PENDING  Incomplete  Radiology Studies: CT CHEST WO CONTRAST  Result Date: 12/22/2021 CLINICAL DATA:  Follow-up 6-8 mm lung nodule left lower lobe present on CT chest in 2011. Shortness of breath. Follow-up abnormal x-ray. EXAM: CT CHEST WITHOUT CONTRAST TECHNIQUE: Multidetector CT imaging of the chest was performed following the standard protocol without IV contrast. RADIATION DOSE REDUCTION: This exam was performed according to the departmental dose-optimization program which includes automated exposure control, adjustment of the mA and/or kV according to patient size and/or use of iterative reconstruction technique. COMPARISON:  Chest radiographs 12/17/2021, CT chest 10/15/2010 FINDINGS: Cardiovascular: Heart size is mildly enlarged. Coronary artery and aortic root calcifications are noted. The ascending aorta measures up to 4.0 cm in caliber, borderline aneurysmal. This is increased from 3.6 cm on prior CT. Moderate calcifications within the aortic arch. Mediastinum/Nodes: No axillary, mediastinal, or hilar pathologically enlarged lymph nodes by CT criteria. 9 mm short axis inferior right paratracheal lymph node is similar to prior. The thyroid gland is grossly unremarkable. The esophagus follows a normal course of normal caliber. Lungs/Pleura: The central airways are patent. A 6 x 5 mm left lower lobe pulmonary nodule is unchanged from 10/15/2010 and benign. There is an elongated, tubular soft tissue density within the lateral middle lobe (axial 100/4, sagittal 34/6) measuring up to 7 x 7 x 14 mm (transverse by AP by craniocaudal). Due to the elongated shape, it is unclear whether this may represent a vascular lesion or airway impaction, however this does not appear to involve an airway. This is new compared to the prior remote 10/15/2010 CT. There is a mild-to-moderate right pleural effusion. Question mild nodularity of the adjacent posterior right lower lobe  pleura, however this appearance may just be secondary to subsegmental atelectasis. Recommend attention on follow-up. There is a 4 mm nodule within the lingula that may be slightly more distinct, however previously at least a ground-glass density was seen in this same region on remote 10/15/2010 CT (current CT axial 95/4). There are two nearby peripheral left upper lobe apparent cystic/cavitary nodules measuring approximately 4 mm (axial image 62) and 4 mm (axial image 58). These may be secondary to an infectious process. Punctate calcified granuloma is seen at the far inferior aspect of the left lower lobe (axial image 135), benign. Punctate posteromedial left lower lobe 3 mm nodule (axial image 118). Punctate benign calcified granuloma within left lower lobe (axial image 116). There is curvilinear subsegmental atelectasis versus scarring within the peripheral superior aspect of the left upper lobe. No pneumothorax. Upper Abdomen: The gallbladder is partially visualized. Therer appears to be a low-density gallstone measuring at least 19 mm. The gallbladder wall is not well evaluated on the current noncontrast CT with only partial visualization of the gallbladder. If there are symptoms of right upper quadrant pain, a right upper quadrant abdominal ultrasound may help for further characterization. There is a 2 mm right upper pole renal stone. There are punctate calcifications within the lateral aspect of the spleen. Musculoskeletal: There is again nonspecific but apparently stable mild nodularity within the right breast just deep to the right nipple. There is again benign dystrophic calcification within the left breast similar to prior. Mild to moderate multilevel degenerative disc changes of the visualized thoracic spine. IMPRESSION: Compared to 10/15/2010: 1. The previously seen left lower lobe 5 mm nodule is unchanged from prior, demonstrating 11 year stability. This is benign. 2. There is an elongated, tubular  soft tissue density within the lateral middle lobe measuring 7 x 7 x 14  mm. This is indeterminate and could represent a vascular abnormality or a pulmonary nodule. Recommend follow-up CT in 3 months to assess stability. A PET-CT also may be considered for further evaluation. If the nodules are stable at time of repeat CT, then future CT at 18-24 months (from today's scan) is considered optional for low-risk patients, but is recommended for high-risk patients. This recommendation follows the consensus statement: Guidelines for Management of Incidental Pulmonary Nodules Detected on CT Images: From the Fleischner Society 2017; Radiology 2017; 284:228-243. 3. Additional left pulmonary nodules measuring up to 4 mm. Recommend attention on follow-up. 4. Mild-to-moderate right pleural effusion. Question mild nodularity of the adjacent posterior right lower lobe pleura, however this appearance may just be secondary to subsegmental atelectasis. Recommend attention on follow-up. Electronically Signed   By: Yvonne Kendall M.D.   On: 12/22/2021 10:11   ECHO TEE  Result Date: 12/22/2021    TRANSESOPHOGEAL ECHO REPORT   Patient Name:   Diamond [name] Date of Exam: 12/22/2021 Medical Rec #:  [id number]      Height:       67.0 in Accession #:    [id number]     Weight:       142.2 lb Date of Birth:  04/03/1951     BSA:          1.749 m Patient Age:    68 years       BP:           124/76 mmHg Patient Gender: F              HR:           75 bpm. Exam Location:  Inpatient Procedure: Transesophageal Echo, 3D Echo, Cardiac Doppler and Color Doppler Indications:     Bacteremia  History:         Patient has prior history of Echocardiogram examinations, most                  recent 12/18/2021. Risk Factors:Hypertension.  Sonographer:     Bernadene Person RDCS Referring Phys:  [id number] Leanor Kail Diagnosing Phys: Candee Furbish MD PROCEDURE: After discussion of the risks and benefits of a TEE, an informed consent was obtained from the  patient. The transesophogeal probe was passed without difficulty through the esophogus of the patient. Sedation performed by different physician. The patient was monitored while under deep sedation. Anesthestetic sedation was provided intravenously by Anesthesiology: 512.14mg  of Propofol, 60mg  of Lidocaine. The patient's vital signs; including heart rate, blood pressure, and oxygen saturation; remained stable throughout the procedure. The patient developed no complications during the procedure. IMPRESSIONS  1. There is a 2.1 x 1.37 mutilobulated mobile mass attached to the RA/IVC junction which has the appearance of thrombus/vegetation in the setting of bacteremia and recently removed dialysis catheter.  2. Left ventricular ejection fraction, by estimation, is 60 to 65%. The left ventricle has normal function. The left ventricle has no regional wall motion abnormalities.  3. Right ventricular systolic function is normal. The right ventricular size is normal.  4. No left atrial/left atrial appendage thrombus was detected.  5. No mitral valve vegetation. The mitral valve is degenerative. Trivial mitral valve regurgitation. No evidence of mitral stenosis.  6. The aortic valve is normal in structure. Aortic valve regurgitation is not visualized. No aortic stenosis is present.  7. The inferior vena cava is normal in size with greater than 50% respiratory variability, suggesting right atrial pressure of 3 mmHg. FINDINGS  Left  Ventricle: Left ventricular ejection fraction, by estimation, is 60 to 65%. The left ventricle has normal function. The left ventricle has no regional wall motion abnormalities. The left ventricular internal cavity size was normal in size. There is  no left ventricular hypertrophy. Right Ventricle: The right ventricular size is normal. No increase in right ventricular wall thickness. Right ventricular systolic function is normal. Left Atrium: Left atrial size was normal in size. No left atrial/left  atrial appendage thrombus was detected. Right Atrium: Right atrial size was normal in size. Pericardium: There is no evidence of pericardial effusion. Mitral Valve: No mitral valve vegetation. The mitral valve is degenerative in appearance. There is mild thickening of the mitral valve leaflet(s). There is mild calcification of the mitral valve leaflet(s). Trivial mitral valve regurgitation. No evidence  of mitral valve stenosis. Tricuspid Valve: The tricuspid valve is normal in structure. Tricuspid valve regurgitation is not demonstrated. No evidence of tricuspid stenosis. Aortic Valve: The aortic valve is normal in structure. Aortic valve regurgitation is not visualized. No aortic stenosis is present. Pulmonic Valve: The pulmonic valve was normal in structure. Pulmonic valve regurgitation is not visualized. No evidence of pulmonic stenosis. Aorta: The aortic root is normal in size and structure. Venous: The inferior vena cava is normal in size with greater than 50% respiratory variability, suggesting right atrial pressure of 3 mmHg. IAS/Shunts: No atrial level shunt detected by color flow Doppler. Additional Comments: There is a 2.1 x 1.37 mutilobulated mobile mass attached to the RA/IVC junction which has the appearance of thrombus/vegetation in the setting of bacteremia and recently removed dialysis catheter. Candee Furbish MD Electronically signed by Candee Furbish MD Signature Date/Time: 12/22/2021/3:48:22 PM    Final         Scheduled Meds:  Chlorhexidine Gluconate Cloth  6 each Topical Q0600   heparin injection (subcutaneous)  5,000 Units Subcutaneous Q8H   mupirocin ointment  1 application Nasal BID   polyethylene glycol  34 g Oral Daily   senna-docusate  2 tablet Oral BID   verapamil  120 mg Oral Q8H   Continuous Infusions:  vancomycin     vancomycin       LOS: 6 days       Oren Binet, MD [facility]   To contact the attending provider between 7A-7P or the covering provider  during after hours 7P-7A, please log into the web site [email or web address] and access using universal [place] password for that web site. If you do not have the password, please call the hospital operator.  12/23/2021, 10:25 AM   Patient ID: Diamond [name], female   DOB: Aug 14, 1951, 71 y.o.   MRN: [id number] Patient ID: Diamond [name], female   DOB: [birth year], 71 y.o.   MRN: [id number] Patient ID: Diamond [name], female   DOB: 1951-04-04, 71 y.o.   MRN: [id number] Patient ID: Diamond [name], female   DOB: 1951-02-05, 71 y.o.   MRN: [id number] Patient ID: Diamond [name], female   DOB: January 04, 1951, 71 y.o.   MRN: [id number] Patient ID: Diamond [name], female   DOB: [birth year], 71 y.o.   MRN: [id number]

## 2021-12-23 NOTE — Assessment & Plan Note (Signed)
Stable-continue as needed bronchodilators.

## 2021-12-23 NOTE — Progress Notes (Signed)
Antimicrobial Stewardship Pharmacy Note   Assessment: 1 YOF w/ ESRD on HD TTS. Indication: MRSA bacteremia with osteomyelitis. Patient will be getting vancomycin at HD outpatient sessions. Please fax labs to [phone].   Plan:  Regimen: vancomycin 500 mg IV every HD TTS Stop Date: 02/13/22 Obtain pre-HD levels as appropriate, goal trough 15-25 mg/L  Eliseo Gum, PharmD Candidate  12/23/2021 11:33 AM

## 2021-12-23 NOTE — Progress Notes (Signed)
RN at bedside. Patient is noted to be with OR department at this time; patient has been off unit since 1430. PACU department notified. Per PACU department, RN overseeing care unable to give update on time of return to unit; call back requested. Patient family updated. Report to be given to oncoming RN

## 2021-12-23 NOTE — Progress Notes (Signed)
RN at bedside. Transport services at bedside to transport patient to dialysis services. Transport explained that patient does not have proper Catheter for dialysis treatment. Transport to take patient down to dialysis without dialysis catheter. Dialysis services notified. Per dialysis center, will contact OR services. OR services notified by RN that patient is currently in dialysis without proper catheter. Agricultural consultant notified. Will continue to monitor.

## 2021-12-23 NOTE — TOC Progression Note (Signed)
Transition of Care [facility]) - Progression Note    Patient Details  Name: Diamond [name] MRN: [id number] Date of Birth: 1951-10-31  Transition of Care [facility]) CM/SW Contact  Deveion Denz Aileen Fass, [place] Work Phone Number: 12/23/2021, 12:06 PM  Clinical Narrative:    CSW intern visited patient at bedside to provide list of SNF bed offers and their medicare ratings. The patient stated she would like to look of over the list before making a decision. CSW Intern will follow up tomorrow morning.   Expected Discharge Plan: [place] Barriers to Discharge: Continued Medical Work up, Ship broker, SNF Pending bed offer  Expected Discharge Plan and Services Expected Discharge Plan: [place] In-house Referral: Clinical Social Work   Post Acute Care Choice: [place] Living arrangements for the past 2 months: Single Family Home                                       Social Determinants of Health (SDOH) Interventions    Readmission Risk Interventions No flowsheet data found.

## 2021-12-23 NOTE — Discharge Instructions (Addendum)
[facility]  Discharge Instructions  AV Fistula or Graft Surgery for Dialysis Access  Please refer to the following instructions for your post-procedure care. Your surgeon or physician assistant will discuss any changes with you.  Activity  You may drive the day following your surgery, if you are comfortable and no longer taking prescription pain medication. Resume full activity as the soreness in your incision resolves.  Bathing/Showering  You may shower after you go home. Keep your incision dry for 48 hours. Do not soak in a bathtub, hot tub, or swim until the incision heals completely. You may not shower if you have a hemodialysis catheter.  Incision Care  Clean your incision with mild soap and water after 48 hours. Pat the area dry with a clean towel. You do not need a bandage unless otherwise instructed. Do not apply any ointments or creams to your incision. You may have skin glue on your incision. Do not peel it off. It will come off on its own in about one week. Your arm may swell a bit after surgery. To reduce swelling use pillows to elevate your arm so it is above your heart. Your doctor will tell you if you need to lightly wrap your arm with an ACE bandage.  Diet  Resume your normal diet. There are not special food restrictions following this procedure. In order to heal from your surgery, it is CRITICAL to get adequate nutrition. Your body requires vitamins, minerals, and protein. Vegetables are the best source of vitamins and minerals. Vegetables also provide the perfect balance of protein. Processed food has little nutritional value, so try to avoid this.  Medications  Resume taking all of your medications. If your incision is causing pain, you may take over-the counter pain relievers such as acetaminophen (Tylenol). If you were prescribed a stronger pain medication, please be aware these medications can cause nausea and constipation. Prevent  nausea by taking the medication with a snack or meal. Avoid constipation by drinking plenty of fluids and eating foods with high amount of fiber, such as fruits, vegetables, and grains.  Do not take Tylenol if you are taking prescription pain medications.  Follow up Your surgeon may want to see you in the office following your access surgery. If so, this will be arranged at the time of your surgery.  Please call us immediately for any of the following conditions:  Increased pain, redness, drainage (pus) from your incision site Fever of 101 degrees or higher Severe or worsening pain at your incision site Hand pain or numbness.  Reduce your risk of vascular disease:  Stop smoking. If you would like help, call [facility] at 1-800-QUIT-NOW (219)867-9870) or [place] at [place] your cholesterol Maintain a desired weight Control your diabetes Keep your blood pressure down  Dialysis  It will take several weeks to several months for your new dialysis access to be ready for use. Your surgeon will determine when it is okay to use it. Your nephrologist will continue to direct your dialysis. You can continue to use your Permcath until your new access is ready for use.   12/23/2021 Diamond [name] [id number] 12-Sep-1951  Surgeon(s): [name], MD  Procedure(s): LEFT ARM FIRST STAGE BASILIC VEIN ARTERIOVENOUS (AV) FISTULA CREATION INSERTION OF DIALYSIS CATHETER  x Do not stick fistula for 12 weeks    If you have any questions, please call the office at [phone].   ___________________________________________________________________________________________________________________________________________________________ Information on my medicine -  ELIQUIS (apixaban)  This medication education was reviewed with me or my healthcare representative as part of my discharge preparation.    Why was Eliquis prescribed for you? Eliquis was prescribed to treat  blood clots that may have been found in the veins of your legs (deep vein thrombosis) or in your lungs (pulmonary embolism) and to reduce the risk of them occurring again.  What do You need to know about Eliquis ? The starting dose is 10 mg (two 5 mg tablets) taken TWICE daily for the FIRST SEVEN (7) DAYS, then on 01/01/2022 the dose is reduced to ONE 5 mg tablet taken TWICE daily.  Eliquis may be taken with or without food.   Try to take the dose about the same time in the morning and in the evening. If you have difficulty swallowing the tablet whole please discuss with your pharmacist how to take the medication safely.  Take Eliquis exactly as prescribed and DO NOT stop taking Eliquis without talking to the doctor who prescribed the medication.  Stopping may increase your risk of developing a new blood clot.  Refill your prescription before you run out.  After discharge, you should have regular check-up appointments with your healthcare provider that is prescribing your Eliquis.    What do you do if you miss a dose? If a dose of ELIQUIS is not taken at the scheduled time, take it as soon as possible on the same day and twice-daily administration should be resumed. The dose should not be doubled to make up for a missed dose.  Important Safety Information A possible side effect of Eliquis is bleeding. You should call your healthcare provider right away if you experience any of the following: Bleeding from an injury or your nose that does not stop. Unusual colored urine (red or dark brown) or unusual colored stools (red or black). Unusual bruising for unknown reasons. A serious fall or if you hit your head (even if there is no bleeding).  Some medicines may interact with Eliquis and might increase your risk of bleeding or clotting while on Eliquis. To help avoid this, consult your healthcare provider or pharmacist prior to using any new prescription or non-prescription medications,  including herbals, vitamins, non-steroidal anti-inflammatory drugs (NSAIDs) and supplements.  This website has more information on Eliquis (apixaban): http://www.eliquis.com/eliquis/home

## 2021-12-23 NOTE — TOC Benefit Eligibility Note (Signed)
Patient Teacher, English as a foreign language completed.    The patient is currently admitted and upon discharge could be taking Eliquis 5 mg.  The current 30 day co-pay is, $45.00.   The patient is currently admitted and upon discharge could be taking warfarin (Coumadin) 5 mg.  The current 30 day co-pay is, $0.00.   The patient is insured through [place], [place] Patient Advocate Specialist [place] Patient Advocate Team Direct Number: [phone]  Fax: [phone]

## 2021-12-23 NOTE — Anesthesia Postprocedure Evaluation (Signed)
Anesthesia Post Note  Patient: Diamond [name]  Procedure(s) Performed: TRANSESOPHAGEAL ECHOCARDIOGRAM (TEE)     Patient location during evaluation: PACU Anesthesia Type: MAC Level of consciousness: awake and alert Pain management: pain level controlled Vital Signs Assessment: post-procedure vital signs reviewed and stable Respiratory status: spontaneous breathing, nonlabored ventilation, respiratory function stable and patient connected to nasal cannula oxygen Cardiovascular status: stable and blood pressure returned to baseline Postop Assessment: no apparent nausea or vomiting Anesthetic complications: no   No notable events documented.  Last Vitals:  Vitals:   12/23/21 1521 12/23/21 1526  BP: (!) 188/116 (!) 168/90  Pulse: 97 96  Resp: 20 (!) 21  Temp:    SpO2: 100% 100%    Last Pain:  Vitals:   12/23/21 1501  TempSrc: Oral  PainSc: 0-No pain                 [name]

## 2021-12-23 NOTE — Progress Notes (Signed)
PT Cancellation Note  Patient Details Name: Diamond [name] MRN: [id number] DOB: Dec 23, 1950   Cancelled Treatment:    Reason Eval/Treat Not Completed: Patient at procedure or test/unavailable.   ?Fistula access.  Will see later if able and appropriate. 12/23/2021  Diamond Carne., PT Acute Rehabilitation Services [phone]  (pager) [phone]  (office)   Diamond [name] 12/23/2021, 3:57 PM

## 2021-12-23 NOTE — Progress Notes (Signed)
[place] for Infectious Disease  Date of Admission:  12/16/2021     Principal Problem:   Severe sepsis [facility]) Active Problems:   Hypertension   Anemia of chronic disease   Leukocytosis   Lactic acidosis   Low back pain   ESRD (end stage renal disease) (HCC)   Mild intermittent asthma   Vertebral osteomyelitis (HCC)       Abx: 1/12-c vanc      Assessment: 70 YF with ESRD on HD via right IJ tunneled catheter admitted for sepsis 2/2 suspect UTI. Urine Cx showed no growth. Found to have MRSA bacteremia and right atrial/ivc clot ?infected, along with L5-s1 osteomyelitis without soft tissue abscess  #MRSA bacteremia #L5-S1 osteomyelitis #ESRD on HD via RIJ SP removal on 1/14 1/11 blood Cx+ MRSA, 1/13 blood Cx NGTD TTE showed mitral valve thickening/fibrinous density; tee didn't see that but saw right atrial/ivc clot/mobile density (suspect clot ?infected -- in setting of previous HD catheter) MRI showed concern for L5-S1 discitis/osteomyelitis -- no obvious abscess, but mentioned also mild right psoas edema  -Pt had RIJ TDC removed on 1/14; from id standpoint ok to place new HD line  -needing 8 weeks treatment at least in setting of MRSI vertebral OM.    #Rt sided pulmonary nodule on CT  in 2011  -Follow-up imaging per primary/pulm   Recommendations: -continue vancomycin with dialysis -ok to place new tunneled HD catheter from id standpoint -anticoagulation per primary team -8 weeks abx planned from line removal  -will sign off -discussed with primary team  OPAT Orders/dialysis center order Discharge antibiotics to be given post dialysis tiw via dialysis access Aim for Vancomycin trough (predialysis level) 15-25 Duration: 8 weeks from 1/14 End Date: 02/13/2022   Labs weekly while on IV antibiotics to be done at dialysis center _x_ CBC with differential __ BMP _x_ CMP _x_ CRP __ ESR __ Vancomycin trough __ CK   Fax weekly labs to [phone]  [phone]  Clinic Follow Up Appt: 3/11 @ 1115  @  [facility] [place], [place], [place] [address] Phone: [phone]     Microbiology:     SUBJECTIVE: No complaint Waiting for hd catheter placement Tee reviewed with patient - a clot on right atrial side/ivc No n/v/diarrhea/f/c No area of pain   Review of Systems: Review of Systems  All other systems reviewed and are negative.   Scheduled Meds:  Chlorhexidine Gluconate Cloth  6 each Topical Q0600   heparin injection (subcutaneous)  5,000 Units Subcutaneous Q8H   mupirocin ointment  1 application Nasal BID   polyethylene glycol  34 g Oral Daily   senna-docusate  2 tablet Oral BID   verapamil  120 mg Oral Q8H   Continuous Infusions:  vancomycin     vancomycin     PRN Meds:.acetaminophen **OR** acetaminophen, albuterol, naLOXone (NARCAN)  injection, oxyCODONE, polyethylene glycol No Known Allergies  OBJECTIVE: Vitals:   12/22/21 2029 12/23/21 0022 12/23/21 0400 12/23/21 0800  BP: 133/86 125/81 (!) 149/92 (!) 127/47  Pulse: 83 78 92 73  Resp: '16 18 20 15  ' Temp: 98.3 F (36.8 C)  98.7 F (37.1 C) 98.9 F (37.2 C)  TempSrc: Oral Oral Axillary Oral  SpO2: 94% 94% 96% 91%  Weight:      Height:       Body mass index is 22.27 kg/m.  Physical exam: General/constitutional: no distress, pleasant HEENT: Normocephalic, PER, Conj Clear, EOMI, Oropharynx clear  Neck supple CV: rrr no mrg Lungs: clear to auscultation, normal respiratory effort Abd: Soft, Nontender Ext: no edema Skin: No Rash Neuro: nonfocal MSK: no peripheral joint swelling/tenderness/warmth         Lab Results Lab Results  Component Value Date   WBC 6.9 12/23/2021   HGB 9.7 (L) 12/23/2021   HCT 28.4 (L) 12/23/2021   MCV 93.1 12/23/2021   PLT 169 12/23/2021    Lab Results  Component Value Date   CREATININE 8.04 (H) 12/23/2021   BUN 74 (H) 12/23/2021   NA 128 (L) 12/23/2021   K 4.2 12/23/2021   CL 93 (L)  12/23/2021   CO2 21 (L) 12/23/2021    Lab Results  Component Value Date   ALT 8 12/17/2021   AST 11 (L) 12/17/2021   ALKPHOS 80 12/17/2021   BILITOT 1.1 12/17/2021     Imaging: I reviewed imaging and incorporate finding into assessment/plan   1/13 lumbar and thoracic mri non-con Endplate irregularity and marrow edema with disc edema at L5-S1. Mild right psoas edema. Some ill-defined signal in the ventral epidural space above and below this disc level. This may be degenerative in etiology or reflect discitis/osteomyelitis.   Degenerative changes result in moderate to marked canal stenosis at L4-L5 with narrowing of subarticular recesses.   1/17 tee Abnormal study with right atrial/IVC globular mobile mass compatible with thrombotic material/vegetative material.   Jabier Mutton, [place] for Infectious Disease [facility] 12/23/2021, 11:22 AM

## 2021-12-23 NOTE — Op Note (Addendum)
DATE OF SERVICE: 12/23/2021  PATIENT:  Diamond [name]  71 y.o. female  PRE-OPERATIVE DIAGNOSIS:  ESRD; bacteremia; SVC vegetation  POST-OPERATIVE DIAGNOSIS:  Same  PROCEDURE:   1) Left brachiobasilic first stage arteriovenous fistula creation 2) Placement of right internal jugular tunneled dialysis catheter   SURGEON:  Surgeon(s) and Role:    * [name], MD - Primary  ASSISTANT: Kateri Plummer, PA-C  An experienced assistant was required given the complexity of this procedure and the standard of surgical care. My assistant helped with exposure through counter tension, suctioning, ligation and retraction to better visualize the surgical field.  My assistant expedited sewing during the case by following my sutures. Wherever I use the term "we" in the report, my assistant actively helped me with that portion of the procedure.  ANESTHESIA:   regional and MAC  EBL: minimal  BLOOD ADMINISTERED:none  DRAINS: none   LOCAL MEDICATIONS USED:  LIDOCAINE   SPECIMEN:  none  COUNTS: confirmed correct.  TOURNIQUET:  none  PATIENT DISPOSITION:  PACU - hemodynamically stable.   Delay start of Pharmacological VTE agent (>24hrs) due to surgical blood loss or risk of bleeding: no  INDICATION FOR PROCEDURE: Diamond [name] is a 71 y.o. female with ESRD. She was dialyzing through a tunneled dialysis catheter. I previously offered her autogenous access, but she could not make her surgical appointment. She was admitted to the hospital with bacteremia. I removed her tunneled dialysis catheter. TEE showed vegetation on RA/SVC junction - likely fibrin sheath with bacteria. ID reviewed the case and was comfortable with placement of new dialysis catheter. After careful discussion of risks, benefits, and alternatives the patient was offered AV fistula creation and tunneled dialysis catheter placement. The patient  understood and wished to proceed.  OPERATIVE FINDINGS: healthy basilic vein - unremarkable  fistula creation. Unremarkable TDC placement.  DESCRIPTION OF PROCEDURE: After identification of the patient in the pre-operative holding area, the patient was transferred to the operating room. The patient was positioned supine on the operating room table. Anesthesia was induced. The left arm and right neck/chest were prepped and draped in standard fashion. A surgical pause was performed confirming correct patient, procedure, and operative location.  Using intraoperative ultrasound the brachial artery and basilic vein were mapped.  A transverse incision was planned over the course of the two vessels in the antecubital fossa to allow fistula creation.  Incision was created.  Incision was carried down through subcutaneous tissue.  The aponeurosis of the biceps tendon was divided.  The brachial sheath was identified.  The brachial artery was skeletonized.  The artery was encircled with 2 Silastic Vesseloops.  Next attention was turned to the basilic vein.  This was identified in the medial arm in its typical position.  The vein was mobilized throughout the length of the incision to allow tension-free arteriovenous fistula creation.  The distal end of the vein was clamped with a right angle.  The proximal end of the vein was clamped with a bulldog.  The vein was transected distally.  The stump was oversewn with a 2-0 silk.  The cut end of the vein was spatulated and distended with a mosquito clamp.  Patient was systemically heparinized with 3000 units of IV heparin.  After a three minute pause, the brachial artery was clamped proximally distally.  The basilic vein was anastomosed to the brachial artery into side using continuous running suture of 6-0 Prolene.  Immediately prior to completion the anastomosis was flushed and de-aired.  The anastomosis was completed.  Clamps were released.  Hemostasis was achieved.  An audible bruit was heard in the fistula.  Palpable pulse radial artery was felt in the left wrist.   Stasis was achieved in the surgical bed.  The wound was closed with 3-0 Vicryl and 4-0 Monocryl.  Using ultrasound guidance the right internal jugular vein was accessed with micropuncture technique.  Through the micropuncture sheath a floppy J-wire was advanced into the superior vena cava.  A small incision was made around the skin access point.  The access point was serially dilated under direct fluoroscopic guidance.  A peel-away sheath was introduced into the superior vena cava under fluoroscopic guidance.  A counterincision was made in the chest under the clavicle.  A 19 cm tunnel dialysis catheter was then tunneled under the skin, over the clavicle into the incision in the neck.  The tunneling device was removed and the catheter fed through the peel-away sheath into the superior vena cava.  The peel-away sheath was removed and the catheter gently pulled back.  Adequate position was confirmed with x-ray.  The catheter was tested and found to flush and draw back well.  Catheter was heparin locked.  Caps were applied.  Catheter was sutured to the skin.  The neck incision was closed with 4-0 Monocryl.  Upon completion of the case instrument and sharps counts were confirmed correct. The patient was transferred to the PACU in good condition. I was present for all portions of the procedure.  Yevonne Aline. Stanford Breed, MD [facility] Phone Number: [phone] 12/23/2021 5:18 PM

## 2021-12-23 NOTE — Assessment & Plan Note (Signed)
Volume management with HD-follow.

## 2021-12-23 NOTE — Anesthesia Procedure Notes (Signed)
Anesthesia Regional Block: Supraclavicular block   Pre-Anesthetic Checklist: , timeout performed,  Correct Patient, Correct Site, Correct Laterality,  Correct Procedure, Correct Position, site marked,  Risks and benefits discussed,  Surgical consent,  Pre-op evaluation,  At surgeon's request and post-op pain management  Laterality: Left  Prep: chloraprep       Needles:  Injection technique: Single-shot  Needle Type: Echogenic Needle     Needle Length: 9cm  Needle Gauge: 21     Additional Needles:   Procedures:,,,, ultrasound used (permanent image in chart),,    Narrative:  Start time: 12/23/2021 3:47 PM End time: 12/23/2021 3:52 PM Injection made incrementally with aspirations every 5 mL.  Performed by: Personally  Anesthesiologist: Santa Lighter, MD  Additional Notes: No pain on injection. No increased resistance to injection. Injection made in 5cc increments.  Good needle visualization.  Patient tolerated procedure well.

## 2021-12-23 NOTE — Progress Notes (Signed)
[facility]ASCULAR [facility] VEIN SPECIALISTS OF [place] PROGRESS NOTE  ASSESSMENT / PLAN: KENNETTE [name] is a 71 y.o. female with ESRD in need of HD access. Plan AVF + TDC today in OR. Appreciate cardiology and ID assistance.   SUBJECTIVE: No complaints.   OBJECTIVE: BP (!) 155/96 (BP Location: Right Arm)    Pulse 100    Temp 99.3 F (37.4 C) (Oral)    Resp 14    Ht 5\' 7"  (1.702 m)    Wt 64.5 kg    SpO2 98%    BMI 22.27 kg/m   No distress Regular rate and rhythm Unlabored breathing  CBC Latest Ref Rng & Units 12/23/2021 12/19/2021 12/17/2021  WBC 4.0 - 10.5 K/uL 6.9 12.2(H) 20.3(H)  Hemoglobin 12.0 - 15.0 g/dL 9.7(L) 10.4(L) 11.3(L)  Hematocrit 36.0 - 46.0 % 28.4(L) 30.3(L) 36.0  Platelets 150 - 400 K/uL 169 220 153     CMP Latest Ref Rng & Units 12/23/2021 12/21/2021 12/19/2021  Glucose 70 - 99 mg/dL 92 92 101(H)  BUN 8 - 23 mg/dL 74(H) 55(H) 60(H)  Creatinine 0.44 - 1.00 mg/dL 8.04(H) 6.58(H) 6.98(H)  Sodium 135 - 145 mmol/L 128(L) 128(L) 132(L)  Potassium 3.5 - 5.1 mmol/L 4.2 3.7 3.4(L)  Chloride 98 - 111 mmol/L 93(L) 93(L) 93(L)  CO2 22 - 32 mmol/L 21(L) 23 21(L)  Calcium 8.9 - 10.3 mg/dL 9.4 9.2 9.5  Total Protein 6.5 - 8.1 g/dL - - -  Total Bilirubin 0.3 - 1.2 mg/dL - - -  Alkaline Phos 38 - 126 U/L - - -  AST 15 - 41 U/L - - -  ALT 0 - 44 U/L - - -    Estimated Creatinine Clearance: 6.2 mL/min (A) (by C-G formula based on SCr of 8.04 mg/dL (H)).  Diamond [name]. Stanford Breed, MD [facility] of [facility] Phone Number: [phone] 12/23/2021 3:04 PM

## 2021-12-23 NOTE — Transfer of Care (Signed)
Immediate Anesthesia Transfer of Care Note  Patient: Diamond [name]  Procedure(s) Performed: LEFT ARM BRACHIOBASILIC VEIN ARTERIOVENOUS (AV) FISTULA CREATION (Left) INSERTION OF DIALYSIS CATHETER (Right) ULTRASOUND GUIDANCE FOR VASCULAR ACCESS  Patient Location: PACU  Anesthesia Type:MAC and Regional  Level of Consciousness: drowsy  Airway & Oxygen Therapy: Patient Spontanous Breathing  Post-op Assessment: Report given to RN and Post -op Vital signs reviewed and stable  Post vital signs: Reviewed and stable  Last Vitals:  Vitals Value Taken Time  BP 148/85 12/23/21 1730  Temp 36.2 C 12/23/21 1730  Pulse 86 12/23/21 1734  Resp 19 12/23/21 1734  SpO2 99 % 12/23/21 1734  Vitals shown include unvalidated device data.  Last Pain:  Vitals:   12/23/21 1730  TempSrc:   PainSc: Asleep      Patients Stated Pain Goal: 3 (20/10/07 1219)  Complications: No notable events documented.

## 2021-12-24 ENCOUNTER — Encounter (HOSPITAL_COMMUNITY): Payer: Self-pay | Admitting: Cardiology

## 2021-12-24 DIAGNOSIS — I5189 Other ill-defined heart diseases: Secondary | ICD-10-CM

## 2021-12-24 DIAGNOSIS — I318 Other specified diseases of pericardium: Secondary | ICD-10-CM

## 2021-12-24 DIAGNOSIS — Z9889 Other specified postprocedural states: Secondary | ICD-10-CM

## 2021-12-24 DIAGNOSIS — A4902 Methicillin resistant Staphylococcus aureus infection, unspecified site: Secondary | ICD-10-CM

## 2021-12-24 DIAGNOSIS — M4620 Osteomyelitis of vertebra, site unspecified: Secondary | ICD-10-CM | POA: Diagnosis not present

## 2021-12-24 DIAGNOSIS — A419 Sepsis, unspecified organism: Secondary | ICD-10-CM | POA: Diagnosis not present

## 2021-12-24 DIAGNOSIS — I38 Endocarditis, valve unspecified: Secondary | ICD-10-CM

## 2021-12-24 DIAGNOSIS — R7881 Bacteremia: Secondary | ICD-10-CM

## 2021-12-24 DIAGNOSIS — M4647 Discitis, unspecified, lumbosacral region: Secondary | ICD-10-CM

## 2021-12-24 DIAGNOSIS — T827XXA Infection and inflammatory reaction due to other cardiac and vascular devices, implants and grafts, initial encounter: Secondary | ICD-10-CM

## 2021-12-24 DIAGNOSIS — N186 End stage renal disease: Secondary | ICD-10-CM | POA: Diagnosis not present

## 2021-12-24 DIAGNOSIS — E871 Hypo-osmolality and hyponatremia: Secondary | ICD-10-CM

## 2021-12-24 DIAGNOSIS — B9562 Methicillin resistant Staphylococcus aureus infection as the cause of diseases classified elsewhere: Secondary | ICD-10-CM

## 2021-12-24 LAB — RENAL FUNCTION PANEL
Albumin: 2.1 g/dL — ABNORMAL LOW (ref 3.5–5.0)
Anion gap: 18 — ABNORMAL HIGH (ref 5–15)
BUN: 81 mg/dL — ABNORMAL HIGH (ref 8–23)
CO2: 18 mmol/L — ABNORMAL LOW (ref 22–32)
Calcium: 9.3 mg/dL (ref 8.9–10.3)
Chloride: 94 mmol/L — ABNORMAL LOW (ref 98–111)
Creatinine, Ser: 8.86 mg/dL — ABNORMAL HIGH (ref 0.44–1.00)
GFR, Estimated: 4 mL/min — ABNORMAL LOW (ref >60)
Glucose, Bld: 91 mg/dL (ref 70–99)
Phosphorus: 10.2 mg/dL — ABNORMAL HIGH (ref 2.5–4.6)
Potassium: 4.3 mmol/L (ref 3.5–5.1)
Sodium: 130 mmol/L — ABNORMAL LOW (ref 135–145)

## 2021-12-24 LAB — CULTURE, BLOOD (ROUTINE X 2)
Culture: NO GROWTH
Culture: NO GROWTH

## 2021-12-24 LAB — CBC
HCT: 29.4 % — ABNORMAL LOW (ref 36.0–46.0)
Hemoglobin: 9.8 g/dL — ABNORMAL LOW (ref 12.0–15.0)
MCH: 31.3 pg (ref 26.0–34.0)
MCHC: 33.3 g/dL (ref 30.0–36.0)
MCV: 93.9 fL (ref 80.0–100.0)
Platelets: 193 10*3/uL (ref 150–400)
RBC: 3.13 MIL/uL — ABNORMAL LOW (ref 3.87–5.11)
RDW: 17.2 % — ABNORMAL HIGH (ref 11.5–15.5)
WBC: 7.5 10*3/uL (ref 4.0–10.5)
nRBC: 0 % (ref 0.0–0.2)

## 2021-12-24 LAB — TROPONIN I (HIGH SENSITIVITY)
Troponin I (High Sensitivity): 24 ng/L — ABNORMAL HIGH (ref <18)
Troponin I (High Sensitivity): 28 ng/L — ABNORMAL HIGH (ref <18)

## 2021-12-24 MED ORDER — SODIUM CHLORIDE 0.9 % IV SOLN: 100.0000 mL | INTRAVENOUS | Status: DC | PRN

## 2021-12-24 MED ORDER — VANCOMYCIN HCL 500 MG/100ML IV SOLN
500.0000 mg | Freq: Once | INTRAVENOUS | Status: DC
  Filled 2021-12-24: qty 100

## 2021-12-24 MED ORDER — HYDRALAZINE HCL 20 MG/ML IJ SOLN: 5.0000 mg | INTRAMUSCULAR | Status: DC | PRN

## 2021-12-24 MED ORDER — LIDOCAINE HCL (PF) 1 % IJ SOLN: 5.0000 mL | INTRAMUSCULAR | Status: DC | PRN

## 2021-12-24 MED ORDER — HEPARIN SODIUM (PORCINE) 1000 UNIT/ML IJ SOLN
INTRAMUSCULAR | Status: AC
  Filled 2021-12-24: qty 4

## 2021-12-24 MED ORDER — HEPARIN SODIUM (PORCINE) 1000 UNIT/ML DIALYSIS: 1000.0000 [IU] | INTRAMUSCULAR | Status: DC | PRN

## 2021-12-24 MED ORDER — PENTAFLUOROPROP-TETRAFLUOROETH EX AERO: 1.0000 "application " | INHALATION_SPRAY | CUTANEOUS | Status: DC | PRN

## 2021-12-24 MED ORDER — APIXABAN 5 MG PO TABS: 5.0000 mg | ORAL_TABLET | Freq: Two times a day (BID) | ORAL | Status: DC

## 2021-12-24 MED ORDER — HYDRALAZINE HCL 25 MG PO TABS
25.0000 mg | ORAL_TABLET | Freq: Three times a day (TID) | ORAL | Status: DC
  Administered 2021-12-24 – 2021-12-30 (×15): 25 mg via ORAL
  Filled 2021-12-24 (×15): qty 1

## 2021-12-24 MED ORDER — APIXABAN (ELIQUIS) EDUCATION KIT FOR DVT/PE PATIENTS
PACK | Freq: Once | Status: DC
  Filled 2021-12-24: qty 1

## 2021-12-24 MED ORDER — HEPARIN SODIUM (PORCINE) 1000 UNIT/ML DIALYSIS: 20.0000 [IU]/kg | INTRAMUSCULAR | Status: DC | PRN

## 2021-12-24 MED ORDER — LIDOCAINE-PRILOCAINE 2.5-2.5 % EX CREA: 1.0000 "application " | TOPICAL_CREAM | CUTANEOUS | Status: DC | PRN

## 2021-12-24 MED ORDER — VANCOMYCIN HCL 500 MG/100ML IV SOLN: 500.0000 mg | INTRAVENOUS | Status: DC

## 2021-12-24 MED ORDER — ALTEPLASE 2 MG IJ SOLR: 2.0000 mg | Freq: Once | INTRAMUSCULAR | Status: DC | PRN

## 2021-12-24 MED ORDER — APIXABAN 5 MG PO TABS
10.0000 mg | ORAL_TABLET | Freq: Two times a day (BID) | ORAL | Status: DC
  Administered 2021-12-25: 10 mg via ORAL
  Filled 2021-12-24: qty 2

## 2021-12-24 MED ORDER — VANCOMYCIN HCL 500 MG/100ML IV SOLN
500.0000 mg | INTRAVENOUS | Status: DC
  Administered 2021-12-24 – 2021-12-29 (×2): 500 mg via INTRAVENOUS
  Filled 2021-12-24 (×4): qty 100

## 2021-12-24 NOTE — Anesthesia Postprocedure Evaluation (Signed)
Anesthesia Post Note  Patient: Diamond [name]  Procedure(s) Performed: LEFT ARM BRACHIOBASILIC VEIN ARTERIOVENOUS (AV) FISTULA CREATION (Left) INSERTION OF DIALYSIS CATHETER (Right) ULTRASOUND GUIDANCE FOR VASCULAR ACCESS     Patient location during evaluation: PACU Anesthesia Type: Regional and MAC Level of consciousness: awake and alert Pain management: pain level controlled Vital Signs Assessment: post-procedure vital signs reviewed and stable Respiratory status: spontaneous breathing, nonlabored ventilation, respiratory function stable and patient connected to nasal cannula oxygen Cardiovascular status: stable and blood pressure returned to baseline Postop Assessment: no apparent nausea or vomiting Anesthetic complications: no   No notable events documented.  Last Vitals:  Vitals:   12/24/21 0400 12/24/21 0624  BP: (!) 169/106   Pulse: 99 (!) 102  Resp: 17 15  Temp:    SpO2: 94% 94%    Last Pain:  Vitals:   12/23/21 2000  TempSrc:   PainSc: 0-No pain                 Belenda Cruise P Gennett Garcia

## 2021-12-24 NOTE — Progress Notes (Addendum)
°  Progress Note    12/24/2021 7:56 AM 1 Day Post-Op  Subjective:  no major complaints   Vitals:   12/24/21 0400 12/24/21 0624  BP: (!) 169/106   Pulse: 99 (!) 102  Resp: 17 15  Temp:    SpO2: 94% 94%   Physical Exam: Cardiac:  regular; right TDC dressings c/d/i Lungs:  non labored Incisions:  left AC incision is intact and well appearing. Mild swelling Extremities:  2+ left radial pulse. Left hand warm. 5/5 grip strength Neurologic: alert and oriented  CBC    Component Value Date/Time   WBC 7.5 12/24/2021 0508   RBC 3.13 (L) 12/24/2021 0508   HGB 9.8 (L) 12/24/2021 0508   HCT 29.4 (L) 12/24/2021 0508   PLT 193 12/24/2021 0508   MCV 93.9 12/24/2021 0508   MCH 31.3 12/24/2021 0508   MCHC 33.3 12/24/2021 0508   RDW 17.2 (H) 12/24/2021 0508   LYMPHSABS 0.7 12/17/2021 0554   MONOABS 1.1 (H) 12/17/2021 0554   EOSABS 0.0 12/17/2021 0554   BASOSABS 0.1 12/17/2021 0554    BMET    Component Value Date/Time   NA 130 (L) 12/24/2021 0508   K 4.3 12/24/2021 0508   CL 94 (L) 12/24/2021 0508   CO2 18 (L) 12/24/2021 0508   GLUCOSE 91 12/24/2021 0508   BUN 81 (H) 12/24/2021 0508   CREATININE 8.86 (H) 12/24/2021 0508   CREATININE 5.76 (H) 12/31/2020 1456   CALCIUM 9.3 12/24/2021 0508   GFRNONAA 4 (L) 12/24/2021 0508   GFRAA 14 (L) 11/12/2019 0344    INR    Component Value Date/Time   INR 1.0 10/16/2019 1730     Intake/Output Summary (Last 24 hours) at 12/24/2021 0756 Last data filed at 12/24/2021 0617 Gross per 24 hour  Intake 232 ml  Output 610 ml  Net -378 ml     Assessment/Plan:  71 y.o. female is s/p 1) Left brachiobasilic first stage arteriovenous fistula creation 2) Placement of right internal jugular tunneled dialysis catheter  1 Day Post-Op   Left AC incision is well appearing, intact, clean and dry Left AV fistula with great thrill No signs or symptoms of steal TDC well appearing and working well Patient will have follow up in our office in 4-6  weeks with Fistula duplex  Karoline Caldwell, PA-C [facility] [phone] 12/24/2021 7:56 AM  VASCULAR STAFF ADDENDUM: I have independently interviewed and examined the patient. I agree with the above.  Follow up as outpatient. Please call for questions.  Yevonne Aline. Stanford Breed, MD [facility] Phone Number: [phone] 12/24/2021 11:51 AM

## 2021-12-24 NOTE — Progress Notes (Signed)
RN at bedside. Patient calm and free of pain. Patient informed of upcoming dialysis appointment. Patient states she declined to go to dialysis services; patient educated on possible complications of skipping routine dialysis treatment. Patient pleasantly refuses treatment; attending MD notified. Will continue to monitor

## 2021-12-24 NOTE — Progress Notes (Addendum)
PROGRESS NOTE        PATIENT DETAILS Name: Diamond [name] Age: 71 y.o. Sex: female Date of Birth: 11/03/1951 Admit Date: 12/16/2021 Admitting Physician Rhetta Mura, DO LNL:[id number], Iona Beard, MD  Brief Narrative: 71 year old female with history of ESRD on HD TTS-presenting with sepsis due to MRSA bacteremia.  See below for further details   Subjective: Lying comfortably in bed-denies any chest pain or shortness of breath.  Objective: Vitals: Blood pressure 133/87, pulse 99, temperature 99.4 F (37.4 C), temperature source Oral, resp. rate 16, height 5\' 7"  (1.702 m), weight 64.6 kg, SpO2 96 %.   Exam: Gen Exam:Alert awake-not in any distress HEENT:atraumatic, normocephalic Chest: B/L clear to auscultation anteriorly CVS:S1S2 regular Abdomen:soft non tender, non distended Extremities:no edema Neurology: Non focal Skin: no rash  Pertinent Labs/Radiology: CBC Latest Ref Rng & Units 12/24/2021 12/23/2021 12/19/2021  WBC 4.0 - 10.5 K/uL 7.5 6.9 12.2(H)  Hemoglobin 12.0 - 15.0 g/dL 9.8(L) 9.7(L) 10.4(L)  Hematocrit 36.0 - 46.0 % 29.4(L) 28.4(L) 30.3(L)  Platelets 150 - 400 K/uL 193 169 220    Lab Results  Component Value Date   NA 130 (L) 12/24/2021   K 4.3 12/24/2021   CL 94 (L) 12/24/2021   CO2 18 (L) 12/24/2021      Assessment/Plan: * Severe sepsis (HCC) due to MRSA bacteremia, L5-S1 discitis and endocarditis- (present on admission) Sepsis physiology has resolved-repeat blood cultures negative so far-  TEE on 1/17 without obvious valvular vegetations-but did have a right atrial/IVC globular mobile mass compatible with thrombotic/vegetative material.  Reasonable to treat like endocarditis with embolic phenomena causing discitis.  ID recommending IV vancomycin-end date-02/13/2022.   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 seen on TEE on 1/17 Discussed with cardiologist-Dr. Marlou Porch on 1/18-probably a thrombus related to HD  catheter-recommends at least 6 months of anticoagulation.    Discussed with Dr. Hayden Rasmussen surgery on 1/19-okay to start anticoagulation as no further surgical procedures are planned.  ESRD (end stage renal disease) ([facility])- (present on admission) Due to bacteremia given line holiday-dialysis catheter was removed on 1/14, on 1/18-vascular surgery placed right IJ TDC and left brachial AV fistula.  Nephrology following-defer HD care to nephrology.  Anemia of chronic disease- (present on admission) Due to ESRD-hemoglobin stable-defer Aranesp/iron to nephrology.  Hyponatremia- (present on admission) Volume management with HD-follow.  Hypertension- (present on admission) BP on the higher side-continue verapamil-at hydralazine.  Follow and adjust.  Mild intermittent asthma- (present on admission)  Stable-continue as needed bronchodilators.  Lung nodule seen on imaging study- (present on admission)  will need repeat CT chest in 55-month  BMI: Estimated body mass index is 22.31 kg/m as calculated from the following:   Height as of this encounter: 5\' 7"  (1.702 m).   Weight as of this encounter: 64.6 kg.   Procedures:  1/14>> HD catheter removed 1/17>> TEE 1/18>> right IJ tunnel catheter placement, left brachiocephalic for stage AV fistula creation   DVT Prophylaxis: Eliquis. Consults: Nephrology, vascular surgery, infectious disease Code Status:Full code  Family Communication: None at bedside   Disposition Plan: Status is: Inpatient  Remains inpatient appropriate because: MRSA bacteremia-on IV vancomycin-recommendations are for SNF on discharge    Diet: Diet Order             Diet renal with fluid restriction Fluid restriction: 2000 mL Fluid; Room service appropriate? Yes; Fluid consistency: Thin  Diet effective now                     Antimicrobial agents: Anti-infectives (From admission, onward)    Start     Dose/Rate Route Frequency Ordered Stop   12/26/21  1200  vancomycin (VANCOREADY) IVPB 500 mg/100 mL  Status:  Discontinued        500 mg 100 mL/hr over 60 Minutes Intravenous Every T-Th-Sa (Hemodialysis) 12/24/21 1134 12/24/21 1158   12/24/21 1245  vancomycin (VANCOREADY) IVPB 500 mg/100 mL        500 mg 100 mL/hr over 60 Minutes Intravenous Every T-Th-Sa (Hemodialysis) 12/24/21 1158     12/24/21 1230  vancomycin (VANCOREADY) IVPB 500 mg/100 mL  Status:  Discontinued        500 mg 100 mL/hr over 60 Minutes Intravenous  Once 12/24/21 1134 12/24/21 1147   12/23/21 1456  ceFAZolin (ANCEF) 2-4 GM/100ML-% IVPB  Status:  Discontinued       Note to Pharmacy: Gregery Na H: cabinet override      12/23/21 1456 12/23/21 1515   12/23/21 1200  vancomycin (VANCOREADY) IVPB 500 mg/100 mL  Status:  Discontinued        500 mg 100 mL/hr over 60 Minutes Intravenous  Once 12/22/21 1555 12/23/21 2216   12/22/21 1200  vancomycin (VANCOREADY) IVPB 500 mg/100 mL  Status:  Discontinued        500 mg 100 mL/hr over 60 Minutes Intravenous Every T-Th-Sa (Hemodialysis) 12/22/21 0842 12/24/21 1134   12/18/21 0945  vancomycin (VANCOREADY) IVPB 750 mg/150 mL        750 mg 150 mL/hr over 60 Minutes Intravenous  Once 12/18/21 0845 12/18/21 1138   12/17/21 2200  ceFEPIme (MAXIPIME) 1 g in sodium chloride 0.9 % 100 mL IVPB  Status:  Discontinued        1 g 200 mL/hr over 30 Minutes Intravenous Every 24 hours 12/17/21 0129 12/17/21 0326   12/17/21 1800  cefTRIAXone (ROCEPHIN) 1 g in sodium chloride 0.9 % 100 mL IVPB  Status:  Discontinued        1 g 200 mL/hr over 30 Minutes Intravenous Every 24 hours 12/17/21 0326 12/17/21 1631   12/17/21 1445  vancomycin (VANCOREADY) IVPB 750 mg/150 mL  Status:  Discontinued        750 mg 150 mL/hr over 60 Minutes Intravenous Every T-Th-Sa (Hemodialysis) 12/17/21 1432 12/22/21 0842   12/17/21 1400  metroNIDAZOLE (FLAGYL) tablet 500 mg  Status:  Discontinued        500 mg Oral Every 8 hours 12/17/21 1345 12/17/21 1631   12/17/21 1200   vancomycin (VANCOREADY) IVPB 750 mg/150 mL  Status:  Discontinued        750 mg 150 mL/hr over 60 Minutes Intravenous Every T-Th-Sa (Hemodialysis) 12/17/21 0129 12/17/21 0326   12/17/21 0130  vancomycin (VANCOREADY) IVPB 1500 mg/300 mL        1,500 mg 150 mL/hr over 120 Minutes Intravenous  Once 12/17/21 0115 12/17/21 0537   12/17/21 0115  ceFEPIme (MAXIPIME) 2 g in sodium chloride 0.9 % 100 mL IVPB        2 g 200 mL/hr over 30 Minutes Intravenous  Once 12/17/21 0113 12/17/21 0315   12/17/21 0115  metroNIDAZOLE (FLAGYL) IVPB 500 mg        500 mg 100 mL/hr over 60 Minutes Intravenous  Once 12/17/21 0113 12/17/21 0526   12/17/21 0115  vancomycin (VANCOCIN) IVPB 1000 mg/200 mL premix  Status:  Discontinued  1,000 mg 200 mL/hr over 60 Minutes Intravenous  Once 12/17/21 0113 12/17/21 0115        MEDICATIONS: Scheduled Meds:  apixaban   Does not apply Once   apixaban  10 mg Oral BID   Followed by   Derrill Memo ON 12/31/2021] apixaban  5 mg Oral BID   Chlorhexidine Gluconate Cloth  6 each Topical Q0600   darbepoetin (ARANESP) injection - DIALYSIS  40 mcg Intravenous Q Thu-HD   hydrALAZINE  25 mg Oral Q8H   polyethylene glycol  34 g Oral Daily   senna-docusate  2 tablet Oral BID   verapamil  120 mg Oral Q8H   Continuous Infusions:  vancomycin     PRN Meds:.acetaminophen **OR** acetaminophen, albuterol, hydrALAZINE, naLOXone (NARCAN)  injection, oxyCODONE, polyethylene glycol   I have personally reviewed following labs and imaging studies  LABORATORY DATA: CBC: Recent Labs  Lab 12/19/21 0644 12/23/21 0126 12/24/21 0508  WBC 12.2* 6.9 7.5  HGB 10.4* 9.7* 9.8*  HCT 30.3* 28.4* 29.4*  MCV 92.9 93.1 93.9  PLT 220 169 062    Basic Metabolic Panel: Recent Labs  Lab 12/18/21 0736 12/19/21 0630 12/21/21 0645 12/23/21 0126 12/24/21 0508  NA 132* 132* 128* 128* 130*  K 3.5 3.4* 3.7 4.2 4.3  CL 98 93* 93* 93* 94*  CO2 21* 21* 23 21* 18*  GLUCOSE 93 101* 92 92 91  BUN  40* 60* 55* 74* 81*  CREATININE 5.93* 6.98* 6.58* 8.04* 8.86*  CALCIUM 8.9 9.5 9.2 9.4 9.3  PHOS 5.8* 5.9* 7.1*  --  10.2*    GFR: Estimated Creatinine Clearance: 5.6 mL/min (A) (by C-G formula based on SCr of 8.86 mg/dL (H)).  Liver Function Tests: Recent Labs  Lab 12/17/21 1437 12/18/21 0736 12/19/21 0630 12/21/21 0645 12/24/21 0508  AST 11*  --   --   --   --   ALT 8  --   --   --   --   ALKPHOS 80  --   --   --   --   BILITOT 1.1  --   --   --   --   PROT 6.7  --   --   --   --   ALBUMIN 2.5* 2.2* 2.0* 2.0* 2.1*   No results for input(s): LIPASE, AMYLASE in the last 168 hours. No results for input(s): AMMONIA in the last 168 hours.  Coagulation Profile: No results for input(s): INR, PROTIME in the last 168 hours.  Cardiac Enzymes: No results for input(s): CKTOTAL, CKMB, CKMBINDEX, TROPONINI in the last 168 hours.  BNP (last 3 results) No results for input(s): PROBNP in the last 8760 hours.  Lipid Profile: No results for input(s): CHOL, HDL, LDLCALC, TRIG, CHOLHDL, LDLDIRECT in the last 72 hours.  Thyroid Function Tests: No results for input(s): TSH, T4TOTAL, FREET4, T3FREE, THYROIDAB in the last 72 hours.  Anemia Panel: No results for input(s): VITAMINB12, FOLATE, FERRITIN, TIBC, IRON, RETICCTPCT in the last 72 hours.  Urine analysis:    Component Value Date/Time   COLORURINE STRAW (A) 10/01/2021 1234   APPEARANCEUR CLEAR 10/01/2021 1234   LABSPEC 1.009 10/01/2021 1234   PHURINE 7.0 10/01/2021 1234   GLUCOSEU 50 (A) 10/01/2021 1234   HGBUR NEGATIVE 10/01/2021 [place] 10/01/2021 1234   KETONESUR NEGATIVE 10/01/2021 1234   PROTEINUR 100 (A) 10/01/2021 1234   UROBILINOGEN 0.2 09/27/2021 1329   NITRITE NEGATIVE 10/01/2021 1234   LEUKOCYTESUR TRACE (A) 10/01/2021 1234  Sepsis Labs: Lactic Acid, Venous    Component Value Date/Time   LATICACIDVEN 1.8 12/17/2021 0755    MICROBIOLOGY: Recent Results (from the past 240 hour(s))   Resp Panel by RT-PCR (Flu A&B, Covid) Nasopharyngeal Swab     Status: None   Collection Time: 12/16/21  6:02 PM   Specimen: Nasopharyngeal Swab; Nasopharyngeal(NP) swabs in vial transport medium  Result Value Ref Range Status   SARS Coronavirus 2 by RT PCR NEGATIVE NEGATIVE Final    Comment: (NOTE) SARS-CoV-2 target nucleic acids are NOT DETECTED.  The SARS-CoV-2 RNA is generally detectable in upper respiratory specimens during the acute phase of infection. The lowest concentration of SARS-CoV-2 viral copies this assay can detect is 138 copies/mL. A negative result does not preclude SARS-Cov-2 infection and should not be used as the sole basis for treatment or other patient management decisions. A negative result may occur with  improper specimen collection/handling, submission of specimen other than nasopharyngeal swab, presence of viral mutation(s) within the areas targeted by this assay, and inadequate number of viral copies(<138 copies/mL). A negative result must be combined with clinical observations, patient history, and epidemiological information. The expected result is Negative.  Fact Sheet for Patients:  EntrepreneurPulse.com.au  Fact Sheet for Healthcare Providers:  IncredibleEmployment.be  This test is no t yet approved or cleared by the [place] FDA and  has been authorized for detection and/or diagnosis of SARS-CoV-2 by FDA under an Emergency Use Authorization (EUA). This EUA will remain  in effect (meaning this test can be used) for the duration of the COVID-19 declaration under Section 564(b)(1) of the Act, 21 U.S.C.section 360bbb-3(b)(1), unless the authorization is terminated  or revoked sooner.       Influenza A by PCR NEGATIVE NEGATIVE Final   Influenza B by PCR NEGATIVE NEGATIVE Final    Comment: (NOTE) The Xpert Xpress SARS-CoV-2/FLU/RSV plus assay is intended as an aid in the diagnosis of influenza from  Nasopharyngeal swab specimens and should not be used as a sole basis for treatment. Nasal washings and aspirates are unacceptable for Xpert Xpress SARS-CoV-2/FLU/RSV testing.  Fact Sheet for Patients: EntrepreneurPulse.com.au  Fact Sheet for Healthcare Providers: IncredibleEmployment.be  This test is not yet approved or cleared by the [place] FDA and has been authorized for detection and/or diagnosis of SARS-CoV-2 by FDA under an Emergency Use Authorization (EUA). This EUA will remain in effect (meaning this test can be used) for the duration of the COVID-19 declaration under Section 564(b)(1) of the Act, 21 U.S.C. section 360bbb-3(b)(1), unless the authorization is terminated or revoked.  Performed at [facility], [place] [address] [place], [place] [address]   Urine Culture     Status: None   Collection Time: 12/16/21 11:49 PM   Specimen: Urine, Clean Catch  Result Value Ref Range Status   Specimen Description URINE, CLEAN CATCH  Final   Special Requests NONE  Final   Culture   Final    NO GROWTH Performed at [facility], [place] [address] [place], [place] [address]    Report Status 12/17/2021 FINAL  Final  Culture, blood (routine x 2)     Status: Abnormal   Collection Time: 12/17/21  1:14 AM   Specimen: BLOOD LEFT HAND  Result Value Ref Range Status   Specimen Description BLOOD LEFT HAND  Final   Special Requests   Final    BOTTLES DRAWN AEROBIC AND ANAEROBIC Blood Culture adequate volume   Culture  Setup Time  Final    GRAM POSITIVE COCCI IN CLUSTERS IN BOTH AEROBIC AND ANAEROBIC BOTTLES CRITICAL VALUE NOTED.  VALUE IS CONSISTENT WITH PREVIOUSLY REPORTED AND CALLED VALUE.    Culture (A)  Final    STAPHYLOCOCCUS AUREUS SUSCEPTIBILITIES PERFORMED ON PREVIOUS CULTURE WITHIN THE LAST 5 DAYS. Performed at [facility], [place] [address], [place], [place] [address]    Report Status 12/19/2021 FINAL  Final  Culture,  blood (routine x 2)     Status: Abnormal   Collection Time: 12/17/21  1:19 AM   Specimen: BLOOD  Result Value Ref Range Status   Specimen Description BLOOD SITE NOT SPECIFIED  Final   Special Requests   Final    BOTTLES DRAWN AEROBIC AND ANAEROBIC Blood Culture results may not be optimal due to an inadequate volume of blood received in culture bottles   Culture  Setup Time   Final    GRAM POSITIVE COCCI IN CLUSTERS IN BOTH AEROBIC AND ANAEROBIC BOTTLES CRITICAL RESULT CALLED TO, READ BACK BY AND VERIFIED WITHLoletha Grayer AMEND PHARMD [id number] 12/17/21 A BROWNING Performed at [facility], [place] [address] [place], [place] [address]    Culture METHICILLIN RESISTANT STAPHYLOCOCCUS AUREUS (A)  Final   Report Status 12/19/2021 FINAL  Final   Organism ID, Bacteria METHICILLIN RESISTANT STAPHYLOCOCCUS AUREUS  Final      Susceptibility   Methicillin resistant staphylococcus aureus - MIC*    CIPROFLOXACIN <=0.5 SENSITIVE Sensitive     ERYTHROMYCIN <=0.25 SENSITIVE Sensitive     GENTAMICIN <=0.5 SENSITIVE Sensitive     OXACILLIN RESISTANT Resistant     TETRACYCLINE >=16 RESISTANT Resistant     VANCOMYCIN <=0.5 SENSITIVE Sensitive     TRIMETH/SULFA <=10 SENSITIVE Sensitive     CLINDAMYCIN <=0.25 SENSITIVE Sensitive     RIFAMPIN <=0.5 SENSITIVE Sensitive     Inducible Clindamycin NEGATIVE Sensitive     * METHICILLIN RESISTANT STAPHYLOCOCCUS AUREUS  Blood Culture ID Panel (Reflexed)     Status: Abnormal   Collection Time: 12/17/21  1:19 AM  Result Value Ref Range Status   Enterococcus faecalis NOT DETECTED NOT DETECTED Final   Enterococcus Faecium NOT DETECTED NOT DETECTED Final   Listeria monocytogenes NOT DETECTED NOT DETECTED Final   Staphylococcus species DETECTED (A) NOT DETECTED Final    Comment: CRITICAL RESULT CALLED TO, READ BACK BY AND VERIFIED WITH: C AMEND PHARMD [id number] 12/17/21 A BROWNING    Staphylococcus aureus (BCID) DETECTED (A) NOT DETECTED Final    Comment: Methicillin  (oxacillin)-resistant Staphylococcus aureus (MRSA). MRSA is predictably resistant to beta-lactam antibiotics (except ceftaroline). Preferred therapy is vancomycin unless clinically contraindicated. Patient requires contact precautions if  hospitalized. CRITICAL RESULT CALLED TO, READ BACK BY AND VERIFIED WITH: C AMEND PHARMD [id number] 12/17/21 A BROWNING    Staphylococcus epidermidis NOT DETECTED NOT DETECTED Final   Staphylococcus lugdunensis NOT DETECTED NOT DETECTED Final   Streptococcus species NOT DETECTED NOT DETECTED Final   Streptococcus agalactiae NOT DETECTED NOT DETECTED Final   Streptococcus pneumoniae NOT DETECTED NOT DETECTED Final   Streptococcus pyogenes NOT DETECTED NOT DETECTED Final   A.calcoaceticus-baumannii NOT DETECTED NOT DETECTED Final   Bacteroides fragilis NOT DETECTED NOT DETECTED Final   Enterobacterales NOT DETECTED NOT DETECTED Final   Enterobacter cloacae complex NOT DETECTED NOT DETECTED Final   Escherichia coli NOT DETECTED NOT DETECTED Final   Klebsiella aerogenes NOT DETECTED NOT DETECTED Final   Klebsiella oxytoca NOT DETECTED NOT DETECTED Final   Klebsiella pneumoniae NOT DETECTED NOT DETECTED Final  Proteus species NOT DETECTED NOT DETECTED Final   Salmonella species NOT DETECTED NOT DETECTED Final   Serratia marcescens NOT DETECTED NOT DETECTED Final   Haemophilus influenzae NOT DETECTED NOT DETECTED Final   Neisseria meningitidis NOT DETECTED NOT DETECTED Final   Pseudomonas aeruginosa NOT DETECTED NOT DETECTED Final   Stenotrophomonas maltophilia NOT DETECTED NOT DETECTED Final   Candida albicans NOT DETECTED NOT DETECTED Final   Candida auris NOT DETECTED NOT DETECTED Final   Candida glabrata NOT DETECTED NOT DETECTED Final   Candida krusei NOT DETECTED NOT DETECTED Final   Candida parapsilosis NOT DETECTED NOT DETECTED Final   Candida tropicalis NOT DETECTED NOT DETECTED Final   Cryptococcus neoformans/gattii NOT DETECTED NOT DETECTED Final    Meth resistant mecA/C and MREJ DETECTED (A) NOT DETECTED Final    Comment: CRITICAL RESULT CALLED TO, READ BACK BY AND VERIFIED WITHLoletha Grayer AMEND PHARMD [id number] 12/17/21 A BROWNING Performed at [facility], [address], [place], [place] [address]   MRSA Next Gen by PCR, Nasal     Status: Abnormal   Collection Time: 12/19/21 12:40 AM   Specimen: Nasal Mucosa; Nasal Swab  Result Value Ref Range Status   MRSA by PCR Next Gen DETECTED (A) NOT DETECTED Final    Comment: RESULT CALLED TO, READ BACK BY AND VERIFIED WITH: RN M.ENDISON 12/19/21@5 :45 BY TW (NOTE) The GeneXpert MRSA Assay (FDA approved for NASAL specimens only), is one component of a comprehensive MRSA colonization surveillance program. It is not intended to diagnose MRSA infection nor to guide or monitor treatment for MRSA infections. Test performance is not FDA approved in patients less than 76 years old. Performed at [facility], [place] [address], [place], [place] [address]   Culture, blood (routine x 2)     Status: None   Collection Time: 12/19/21  6:32 AM   Specimen: BLOOD  Result Value Ref Range Status   Specimen Description BLOOD LEFT ANTECUBITAL  Final   Special Requests   Final    BOTTLES DRAWN AEROBIC ONLY Blood Culture results may not be optimal due to an inadequate volume of blood received in culture bottles   Culture   Final    NO GROWTH 5 DAYS Performed at [facility], [place] [address] [place], [place] [address]    Report Status 12/24/2021 FINAL  Final  Culture, blood (routine x 2)     Status: None   Collection Time: 12/19/21  6:43 AM   Specimen: BLOOD RIGHT HAND  Result Value Ref Range Status   Specimen Description BLOOD RIGHT HAND  Final   Special Requests   Final    BOTTLES DRAWN AEROBIC ONLY Blood Culture results may not be optimal due to an inadequate volume of blood received in culture bottles   Culture   Final    NO GROWTH 5 DAYS Performed at [facility], [place] [address]  [place], [place] [address]    Report Status 12/24/2021 FINAL  Final    RADIOLOGY STUDIES/RESULTS: DG Chest Port 1 View  Result Date: 12/23/2021 CLINICAL DATA:  Status post dialysis catheter placement EXAM: PORTABLE CHEST 1 VIEW COMPARISON:  12/18/2011 FINDINGS: Right jugular dialysis catheter is noted in satisfactory position. Cardiac shadow is mildly enlarged but stable. Tortuous thoracic aorta is noted. Lungs are clear. No pneumothorax is noted. IMPRESSION: No acute abnormality following dialysis catheter placement. Electronically Signed   By: Inez Catalina M.D.   On: 12/23/2021 19:09   DG C-Arm  1-60 Min-No Report  Result Date: 12/23/2021 Fluoroscopy was utilized by the requesting physician.  No radiographic interpretation.   ECHO TEE  Result Date: 12/22/2021    TRANSESOPHOGEAL ECHO REPORT   Patient Name:   ZARAHI FUERST Date of Exam: 12/22/2021 Medical Rec #:  [id number]      Height:       67.0 in Accession #:    [id number]     Weight:       142.2 lb Date of Birth:  08/14/1951     BSA:          1.749 m Patient Age:    38 years       BP:           124/76 mmHg Patient Gender: F              HR:           75 bpm. Exam Location:  Inpatient Procedure: Transesophageal Echo, 3D Echo, Cardiac Doppler and Color Doppler Indications:     Bacteremia  History:         Patient has prior history of Echocardiogram examinations, most                  recent 12/18/2021. Risk Factors:Hypertension.  Sonographer:     Bernadene Person RDCS Referring Phys:  [id number] Leanor Kail Diagnosing Phys: Candee Furbish MD PROCEDURE: After discussion of the risks and benefits of a TEE, an informed consent was obtained from the patient. The transesophogeal probe was passed without difficulty through the esophogus of the patient. Sedation performed by different physician. The patient was monitored while under deep sedation. Anesthestetic sedation was provided intravenously by Anesthesiology: 512.14mg  of Propofol, 60mg  of Lidocaine. The  patient's vital signs; including heart rate, blood pressure, and oxygen saturation; remained stable throughout the procedure. The patient developed no complications during the procedure. IMPRESSIONS  1. There is a 2.1 x 1.37 mutilobulated mobile mass attached to the RA/IVC junction which has the appearance of thrombus/vegetation in the setting of bacteremia and recently removed dialysis catheter.  2. Left ventricular ejection fraction, by estimation, is 60 to 65%. The left ventricle has normal function. The left ventricle has no regional wall motion abnormalities.  3. Right ventricular systolic function is normal. The right ventricular size is normal.  4. No left atrial/left atrial appendage thrombus was detected.  5. No mitral valve vegetation. The mitral valve is degenerative. Trivial mitral valve regurgitation. No evidence of mitral stenosis.  6. The aortic valve is normal in structure. Aortic valve regurgitation is not visualized. No aortic stenosis is present.  7. The inferior vena cava is normal in size with greater than 50% respiratory variability, suggesting right atrial pressure of 3 mmHg. FINDINGS  Left Ventricle: Left ventricular ejection fraction, by estimation, is 60 to 65%. The left ventricle has normal function. The left ventricle has no regional wall motion abnormalities. The left ventricular internal cavity size was normal in size. There is  no left ventricular hypertrophy. Right Ventricle: The right ventricular size is normal. No increase in right ventricular wall thickness. Right ventricular systolic function is normal. Left Atrium: Left atrial size was normal in size. No left atrial/left atrial appendage thrombus was detected. Right Atrium: Right atrial size was normal in size. Pericardium: There is no evidence of pericardial effusion. Mitral Valve: No mitral valve vegetation. The mitral valve is degenerative in appearance. There is mild thickening of the mitral valve leaflet(s). There is mild  calcification of the mitral  valve leaflet(s). Trivial mitral valve regurgitation. No evidence  of mitral valve stenosis. Tricuspid Valve: The tricuspid valve is normal in structure. Tricuspid valve regurgitation is not demonstrated. No evidence of tricuspid stenosis. Aortic Valve: The aortic valve is normal in structure. Aortic valve regurgitation is not visualized. No aortic stenosis is present. Pulmonic Valve: The pulmonic valve was normal in structure. Pulmonic valve regurgitation is not visualized. No evidence of pulmonic stenosis. Aorta: The aortic root is normal in size and structure. Venous: The inferior vena cava is normal in size with greater than 50% respiratory variability, suggesting right atrial pressure of 3 mmHg. IAS/Shunts: No atrial level shunt detected by color flow Doppler. Additional Comments: There is a 2.1 x 1.37 mutilobulated mobile mass attached to the RA/IVC junction which has the appearance of thrombus/vegetation in the setting of bacteremia and recently removed dialysis catheter. Candee Furbish MD Electronically signed by Candee Furbish MD Signature Date/Time: 12/22/2021/3:48:22 PM    Final      LOS: 7 days   Oren Binet, MD  [facility]    To contact the attending provider between 7A-7P or the covering provider during after hours 7P-7A, please log into the web site [email or web address] and access using universal [place] password for that web site. If you do not have the password, please call the hospital operator.  12/24/2021, 1:57 PM

## 2021-12-24 NOTE — Plan of Care (Signed)

## 2021-12-24 NOTE — Assessment & Plan Note (Addendum)
Due to bacteremia given line holiday-dialysis catheter was removed on 1/14, on 1/18-vascular surgery placed right IJ TDC and left brachial AV fistula.  Nephrology following-defer HD care to nephrology.

## 2021-12-24 NOTE — Progress Notes (Signed)
[facility] Progress Note   Subjective:  Seen in room. Had catheter + AVF placed yesterday and completed dialysis last night.  Her back hurts this am and she really does not want to go to dialysis today.    Objective Vitals:   12/24/21 0000 12/24/21 0400 12/24/21 0624 12/24/21 0804  BP: (!) 161/96 (!) 169/106  (!) 141/94  Pulse: 93 99 (!) 102 96  Resp: 17 17 15 16   Temp:    99.2 F (37.3 C)  TempSrc:    Oral  SpO2: 97% 94% 94% 95%  Weight:   64.6 kg   Height:         Additional Objective Labs: Basic Metabolic Panel: Recent Labs  Lab 12/19/21 0630 12/21/21 0645 12/23/21 0126 12/24/21 0508  NA 132* 128* 128* 130*  K 3.4* 3.7 4.2 4.3  CL 93* 93* 93* 94*  CO2 21* 23 21* 18*  GLUCOSE 101* 92 92 91  BUN 60* 55* 74* 81*  CREATININE 6.98* 6.58* 8.04* 8.86*  CALCIUM 9.5 9.2 9.4 9.3  PHOS 5.9* 7.1*  --  10.2*    CBC: Recent Labs  Lab 12/19/21 0644 12/23/21 0126 12/24/21 0508  WBC 12.2* 6.9 7.5  HGB 10.4* 9.7* 9.8*  HCT 30.3* 28.4* 29.4*  MCV 92.9 93.1 93.9  PLT 220 169 193    Blood Culture    Component Value Date/Time   SDES BLOOD RIGHT HAND 12/19/2021 0643   SPECREQUEST  12/19/2021 0643    BOTTLES DRAWN AEROBIC ONLY Blood Culture results may not be optimal due to an inadequate volume of blood received in culture bottles   CULT  12/19/2021 0643    NO GROWTH 5 DAYS Performed at [facility], [place] [address], [place], [place] [address]    REPTSTATUS 12/24/2021 FINAL 12/19/2021 0076     Physical Exam General: Alert, lying in bed, nad  Heart: RRR Lungs: Clear bilaterally Abdomen: soft non-tender  Extremities: No LE edema  Dialysis Access: New R IJ TDC in place, L AVF +t/b   Medications:  vancomycin      apixaban   Does not apply Once   apixaban  10 mg Oral BID   Followed by   Derrill Memo ON 12/31/2021] apixaban  5 mg Oral BID   Chlorhexidine Gluconate Cloth  6 each Topical Q0600   darbepoetin (ARANESP) injection - DIALYSIS  40 mcg  Intravenous Q Thu-HD   hydrALAZINE  25 mg Oral Q8H   polyethylene glycol  34 g Oral Daily   senna-docusate  2 tablet Oral BID   verapamil  120 mg Oral Q8H    Dialysis Orders:  TTS HD at [facility]  4h  400/600  68.5kg  3/2 bath  Hep 2000    - hect 4 ug tiw  - venofer 50 q wk  - no esa, last Hb 11.9  Assessment/Plan: Sepsis/ MRSA bacteremia - 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 As well concern for vertebral osteo.  ID recommended IV vanc and line holiday. Repeat blood Cx 1/14 neg to date. TTE showed MV vegetation, recommended TEE.   TEE - neg for MV vegetation but R atrial mass seen. Felt to be thrombus related to HD cath and will need 6 months of [facility] TDC removed by VVS on 1/14 for line holiday. New TDC + AVF placed 1/18.  Vancomycin with HD for 8 weeks (end date 02/13/22)  ESRD -HD TTS. Had HD 1/18 after TDC placed. Planned to get back on schedule today, but she does  not want to go for dialysis today. Agrees to tomorrow 1/20, then back on schedule Saturday.  Access - New R IJ TDC + L BB 1st stage AVF placed 1/18 Dr. Stanford Breed  HTN/volume -BP acceptable.  Last post weight not recorded.  Anemia -Hgb 9.8. Start ESA -Aranep 40 with HD tomorrow.  Secondary hyperparathyroidism -Corrected Ca elevated. Phos not at goal.  Continue binders ,hold vitamin D with ^ Ca ,used 2.0 calcium bath  Right-sided pulmonary nodule.  follow-up intervention per primary/pulm Lower back pain.  Possible osteo-/discitis on MRI LS. Pain management per primary   Diamond Child PA-C [facility] 12/24/2021,10:48 AM

## 2021-12-24 NOTE — TOC Progression Note (Addendum)
Transition of Care [facility]) - Progression Note    Patient Details  Name: Diamond [name] MRN: [id number] Date of Birth: 03/20/1951  [facility] CM/SW Contact  Pedro Whiters Aileen Fass, [place] Work Phone Number: 12/24/2021, 12:09 PM  Clinical Narrative:    11:00- CSW intern visited patient at beside to discuss SNF bed decision.The patient stated she does not want to make a discussion without talking with her children. The patient requested we contact her daughter [name] [phone]). CSW intern called and informed [name] of bed offers, she stated she is at work and will able to talk once she visits her mother today at 3pm. [place] Intern will follow up this afternoon.   4:02 pm: Daughter [name] was not present because the patient is currently receiving dialysis. CSW Intern sent the patients daughter [name] the different bed options via text to look over. [name] stated she will contact us when a decision has been made.  4:32 pm: The patient's daughter [name] has chosen [facility]. CSW intern and supervisor will contact [facility] to check if a bed is still available.   Expected Discharge Plan: [place] Barriers to Discharge: Continued Medical Work up, Ship broker, SNF Pending bed offer  Expected Discharge Plan and Services Expected Discharge Plan: [place] In-house Referral: Clinical Social Work   Post Acute Care Choice: [place] Living arrangements for the past 2 months: Single Family Home                                       Social Determinants of Health (SDOH) Interventions    Readmission Risk Interventions No flowsheet data found.

## 2021-12-24 NOTE — Assessment & Plan Note (Addendum)
Discussed with cardiologist-Dr. Marlou Porch on 1/18-probably a thrombus related to HD catheter-recommends at least 6 months of anticoagulation-however now with atrial flutter-will require indefinite anticoagulation.    After discussion with Dr. Hayden Rasmussen surgery on 1/19-full dose anticoagulation was started.  Unfortunately developed bleeding from HD catheter site on 1/21-anticoagulation was held-after hemostasis was obtained-patient was restarted on anticoagulation with IV heparin after discussion with Dr. Scot Dock on 1/22.  Plans are to resume Eliquis prior to discharge.

## 2021-12-24 NOTE — Assessment & Plan Note (Addendum)
Sepsis physiology has resolved-repeat blood cultures negative so far-  TEE on 1/17 without obvious valvular vegetations-but did have a right atrial/IVC globular mobile mass compatible with thrombotic/vegetative material.  Reasonable to treat like endocarditis with embolic phenomena causing discitis.  ID recommending IV vancomycin-end date-02/13/2022.  Please ensure follow-up with the ID clinic once patient completes a course of antibiotics.

## 2021-12-24 NOTE — Progress Notes (Signed)
Dialysis staff reports patient did not get full treatment last night, so will plan on dialysis today.   Lynnda Child PA-C [facility] 12/24/2021,11:54 AM

## 2021-12-24 NOTE — Progress Notes (Signed)
ANTICOAGULATION CONSULT NOTE - Initial Consult  Pharmacy Consult for Apixaban Indication: RA thrombus  No Known Allergies  Patient Measurements: Height: 5\' 7"  (170.2 cm) Weight: 64.6 kg (142 lb 6.7 oz) IBW/kg (Calculated) : 61.6   Vital Signs: Temp: 99.2 F (37.3 C) (01/19 0804) Temp Source: Oral (01/19 0804) BP: 141/94 (01/19 0804) Pulse Rate: 96 (01/19 0804)  Labs: Recent Labs    12/23/21 0126 12/24/21 0039 12/24/21 0508  HGB 9.7*  --  9.8*  HCT 28.4*  --  29.4*  PLT 169  --  193  CREATININE 8.04*  --  8.86*  TROPONINIHS  --  24* 28*    Estimated Creatinine Clearance: 5.6 mL/min (A) (by C-G formula based on SCr of 8.86 mg/dL (H)).   Medical History: Past Medical History:  Diagnosis Date   Asthma    Chronic kidney disease    dialysis Tues Thurs Sat   Dysrhythmia    PAF 10/2019 in setting of COVID-19 PNA   Gout    Hypertension      [name]  with history of ESRD on HD TTS-presenting on 12/16/21  with sepsis due to MRSA bacteremia. Currently on Vancomycin on HD days,  End date for Vanc is 02/13/22.  A right atrial/IVC globular mobile mass compatible with thrombotic material/vegetative material seen on TEE on 1/17. Dr. Sloan Leiter  discussed this with cardiologist-Dr. Marlou Porch who -probably a thrombus related to HD catheter-recommends at least 6 months of anticoagulation.   Pharmacy consulted to start Apixaban for right atrial thrombus.  She was not on anticoagulation prior to admission.  Copay $45 for 30 day supply   Goal of Therapy:  Monitor platelets by anticoagulation protocol: Yes   Plan:  Give Apixaban 10mg  BID x 7 days then on 12/31/21 reduce dose to 5 mg BID Monitor for s/sx of bleeding  Will educate patient about apixaban prior to discharge.  Thank you for allowing pharmacy to be part of this patients care team. Nicole Cella, [place] Pharmacist [phone] 12/24/2021,11:37 AM  Please check AMION for all [place] phone  numbers After 10:00 PM, call [place]

## 2021-12-24 NOTE — Assessment & Plan Note (Addendum)
BP has slowly increased over the past few days-on beta-blocker and hydralazine.  Continue to optimize in the outpatient setting.

## 2021-12-24 NOTE — Progress Notes (Signed)
Dialysis refused, nephrologist made aware.

## 2021-12-24 NOTE — Assessment & Plan Note (Addendum)
Due to worsening hemoglobin from HD catheter site bleeding-transfused 1 unit of PRBC on 01/21.  Hemoglobin dropped again on 1/24-PRBC transfused 2 units-hemoglobin now stable.  Continue to follow hemoglobin periodically at SNF.  Dosing of Aranesp/IV iron will defer to nephrology.

## 2021-12-24 NOTE — Progress Notes (Signed)
PT Cancellation Note  Patient Details Name: BAILEIGH MODISETTE MRN: [id number] DOB: [birth year]   Cancelled Treatment:    Reason Eval/Treat Not Completed: Patient at procedure or test/unavailable  Initiating session when transport arrived to take pt to dialysis. Will attempt next date.   Arby Barrette, PT Acute Rehabilitation Services  Pager [phone] Office [phone]   Rexanne Mano 12/24/2021, 2:49 PM

## 2021-12-25 ENCOUNTER — Encounter (HOSPITAL_COMMUNITY): Payer: Self-pay | Admitting: Internal Medicine

## 2021-12-25 DIAGNOSIS — M4620 Osteomyelitis of vertebra, site unspecified: Secondary | ICD-10-CM | POA: Diagnosis not present

## 2021-12-25 DIAGNOSIS — I4892 Unspecified atrial flutter: Secondary | ICD-10-CM

## 2021-12-25 DIAGNOSIS — D638 Anemia in other chronic diseases classified elsewhere: Secondary | ICD-10-CM | POA: Diagnosis not present

## 2021-12-25 DIAGNOSIS — B9562 Methicillin resistant Staphylococcus aureus infection as the cause of diseases classified elsewhere: Secondary | ICD-10-CM

## 2021-12-25 DIAGNOSIS — A419 Sepsis, unspecified organism: Secondary | ICD-10-CM | POA: Diagnosis not present

## 2021-12-25 DIAGNOSIS — I330 Acute and subacute infective endocarditis: Secondary | ICD-10-CM

## 2021-12-25 DIAGNOSIS — R7881 Bacteremia: Secondary | ICD-10-CM

## 2021-12-25 DIAGNOSIS — N186 End stage renal disease: Secondary | ICD-10-CM | POA: Diagnosis not present

## 2021-12-25 HISTORY — DX: Unspecified atrial flutter: I48.92

## 2021-12-25 MED ORDER — DILTIAZEM HCL-DEXTROSE 125-5 MG/125ML-% IV SOLN (PREMIX)
5.0000 mg/h | INTRAVENOUS | Status: DC
  Administered 2021-12-25: 5 mg/h via INTRAVENOUS
  Filled 2021-12-25: qty 125

## 2021-12-25 MED ORDER — AMIODARONE HCL IN DEXTROSE 360-4.14 MG/200ML-% IV SOLN
60.0000 mg/h | INTRAVENOUS | Status: DC
  Filled 2021-12-25: qty 200

## 2021-12-25 MED ORDER — AMIODARONE HCL IN DEXTROSE 360-4.14 MG/200ML-% IV SOLN
30.0000 mg/h | INTRAVENOUS | Status: DC
  Filled 2021-12-25: qty 200

## 2021-12-25 MED ORDER — LIDOCAINE HCL (PF) 1 % IJ SOLN: 5.0000 mL | INTRAMUSCULAR | Status: DC | PRN

## 2021-12-25 MED ORDER — AMIODARONE LOAD VIA INFUSION
150.0000 mg | Freq: Once | INTRAVENOUS | Status: AC
  Administered 2021-12-25: 150 mg via INTRAVENOUS
  Filled 2021-12-25: qty 83.34

## 2021-12-25 MED ORDER — LIDOCAINE-PRILOCAINE 2.5-2.5 % EX CREA: 1.0000 "application " | TOPICAL_CREAM | CUTANEOUS | Status: DC | PRN

## 2021-12-25 MED ORDER — AMIODARONE HCL IN DEXTROSE 360-4.14 MG/200ML-% IV SOLN
60.0000 mg/h | INTRAVENOUS | Status: AC
  Administered 2021-12-25: 60 mg/h via INTRAVENOUS
  Filled 2021-12-25: qty 200

## 2021-12-25 MED ORDER — APIXABAN 5 MG PO TABS: 5.0000 mg | ORAL_TABLET | Freq: Two times a day (BID) | ORAL | Status: DC

## 2021-12-25 MED ORDER — ALTEPLASE 2 MG IJ SOLR: 2.0000 mg | Freq: Once | INTRAMUSCULAR | Status: DC | PRN

## 2021-12-25 MED ORDER — AMIODARONE HCL IN DEXTROSE 360-4.14 MG/200ML-% IV SOLN
30.0000 mg/h | INTRAVENOUS | Status: DC
  Filled 2021-12-25 (×2): qty 200

## 2021-12-25 MED ORDER — METOPROLOL TARTRATE 25 MG PO TABS
25.0000 mg | ORAL_TABLET | Freq: Two times a day (BID) | ORAL | Status: DC
  Administered 2021-12-25 – 2021-12-26 (×2): 25 mg via ORAL
  Filled 2021-12-25 (×2): qty 1

## 2021-12-25 MED ORDER — AMIODARONE HCL IN DEXTROSE 360-4.14 MG/200ML-% IV SOLN
30.0000 mg/h | INTRAVENOUS | Status: DC
  Administered 2021-12-26: 30 mg/h via INTRAVENOUS
  Filled 2021-12-25 (×2): qty 200

## 2021-12-25 MED ORDER — HEPARIN SODIUM (PORCINE) 1000 UNIT/ML DIALYSIS: 1000.0000 [IU] | INTRAMUSCULAR | Status: DC | PRN

## 2021-12-25 MED ORDER — APIXABAN 5 MG PO TABS
10.0000 mg | ORAL_TABLET | Freq: Two times a day (BID) | ORAL | Status: DC
  Administered 2021-12-25 (×2): 10 mg via ORAL
  Filled 2021-12-25 (×2): qty 2

## 2021-12-25 MED ORDER — DILTIAZEM HCL 25 MG/5ML IV SOLN
20.0000 mg | Freq: Once | INTRAVENOUS | Status: AC
  Administered 2021-12-25: 20 mg via INTRAVENOUS
  Filled 2021-12-25: qty 5

## 2021-12-25 MED ORDER — SODIUM CHLORIDE 0.9 % IV SOLN: 100.0000 mL | INTRAVENOUS | Status: DC | PRN

## 2021-12-25 MED ORDER — PENTAFLUOROPROP-TETRAFLUOROETH EX AERO: 1.0000 "application " | INHALATION_SPRAY | CUTANEOUS | Status: DC | PRN

## 2021-12-25 MED ORDER — SEVELAMER CARBONATE 800 MG PO TABS
1600.0000 mg | ORAL_TABLET | Freq: Three times a day (TID) | ORAL | Status: DC
  Administered 2021-12-27 – 2021-12-30 (×6): 1600 mg via ORAL
  Filled 2021-12-25 (×7): qty 2

## 2021-12-25 NOTE — Progress Notes (Signed)
PROGRESS NOTE        PATIENT DETAILS Name: Diamond [name] Age: 71 y.o. Sex: female Date of Birth: November 11, 1951 Admit Date: 12/16/2021 Admitting Physician Rhetta Mura, DO CNO:[id number], Iona Beard, MD  Brief Narrative: 71 year old female with history of ESRD on HD TTS-presenting with sepsis due to MRSA bacteremia.  See below for further details   Subjective: Lying comfortably in bed-no major issues overnight.  Awaiting SNF/insurance authorization.  Objective: Vitals: Blood pressure 127/83, pulse (!) 104, temperature 100 F (37.8 C), temperature source Oral, resp. rate 17, height 5\' 7"  (1.702 m), weight 61.1 kg, SpO2 100 %.   Exam: Gen Exam:Alert awake-not in any distress HEENT:atraumatic, normocephalic Chest: B/L clear to auscultation anteriorly CVS:S1S2 regular Abdomen:soft non tender, non distended Extremities:no edema Neurology: Non focal Skin: no rash   Pertinent Labs/Radiology: CBC Latest Ref Rng & Units 12/24/2021 12/23/2021 12/19/2021  WBC 4.0 - 10.5 K/uL 7.5 6.9 12.2(H)  Hemoglobin 12.0 - 15.0 g/dL 9.8(L) 9.7(L) 10.4(L)  Hematocrit 36.0 - 46.0 % 29.4(L) 28.4(L) 30.3(L)  Platelets 150 - 400 K/uL 193 169 220    Lab Results  Component Value Date   NA 130 (L) 12/24/2021   K 4.3 12/24/2021   CL 94 (L) 12/24/2021   CO2 18 (L) 12/24/2021      Assessment/Plan: * Severe sepsis (HCC) due to MRSA bacteremia, L5-S1 discitis and endocarditis- (present on admission) Sepsis physiology has resolved-repeat blood cultures negative so far-  TEE on 1/17 without obvious valvular vegetations-but did have a right atrial/IVC globular mobile mass compatible with thrombotic/vegetative material.  Reasonable to treat like endocarditis with embolic phenomena causing discitis.  ID recommending IV vancomycin-end date-02/13/2022.   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 seen on TEE on 1/17 Discussed with cardiologist-Dr. Marlou Porch on 1/18-probably a  thrombus related to HD catheter-recommends at least 6 months of anticoagulation.    Discussed with Dr. Hayden Rasmussen surgery on 1/19-okay to start anticoagulation as no further surgical procedures are planned.  ESRD (end stage renal disease) ([place])- (present on admission) Due to bacteremia given line holiday-dialysis catheter was removed on 1/14, on 1/18-vascular surgery placed right IJ TDC and left brachial AV fistula.  Nephrology following-defer HD care to nephrology.  Anemia of chronic disease- (present on admission) Due to ESRD-hemoglobin stable-defer Aranesp/iron to nephrology.  Hyponatremia- (present on admission) Volume management with HD-follow.  Hypertension- (present on admission) BP better-continue verapamil and hydralazine.  Follow and adjust.    Mild intermittent asthma- (present on admission)  Stable-continue as needed bronchodilators.  Lung nodule seen on imaging study- (present on admission)  will need repeat CT chest in 48-month  BMI: Estimated body mass index is 21.1 kg/m as calculated from the following:   Height as of this encounter: 5\' 7"  (1.702 m).   Weight as of this encounter: 61.1 kg.   Procedures:  1/14>> HD catheter removed 1/17>> TEE 1/18>> right IJ tunnel catheter placement, left brachiocephalic for stage AV fistula creation   DVT Prophylaxis: Eliquis. Consults: Nephrology, vascular surgery, infectious disease Code Status:Full code  Family Communication: None at bedside   Disposition Plan: Status is: Inpatient  Remains inpatient appropriate because: MRSA bacteremia-on IV vancomycin-recommendations are for SNF on discharge    Diet: Diet Order             Diet renal with fluid restriction Fluid restriction: 2000 mL Fluid; Room service appropriate? Yes; Fluid  consistency: Thin  Diet effective now                     Antimicrobial agents: Anti-infectives (From admission, onward)    Start     Dose/Rate Route Frequency Ordered Stop    12/26/21 1200  vancomycin (VANCOREADY) IVPB 500 mg/100 mL  Status:  Discontinued        500 mg 100 mL/hr over 60 Minutes Intravenous Every T-Th-Sa (Hemodialysis) 12/24/21 1134 12/24/21 1158   12/24/21 1245  vancomycin (VANCOREADY) IVPB 500 mg/100 mL        500 mg 100 mL/hr over 60 Minutes Intravenous Every T-Th-Sa (Hemodialysis) 12/24/21 1158     12/24/21 1230  vancomycin (VANCOREADY) IVPB 500 mg/100 mL  Status:  Discontinued        500 mg 100 mL/hr over 60 Minutes Intravenous  Once 12/24/21 1134 12/24/21 1147   12/23/21 1456  ceFAZolin (ANCEF) 2-4 GM/100ML-% IVPB  Status:  Discontinued       Note to Pharmacy: Gregery Na H: cabinet override      12/23/21 1456 12/23/21 1515   12/23/21 1200  vancomycin (VANCOREADY) IVPB 500 mg/100 mL  Status:  Discontinued        500 mg 100 mL/hr over 60 Minutes Intravenous  Once 12/22/21 1555 12/23/21 2216   12/22/21 1200  vancomycin (VANCOREADY) IVPB 500 mg/100 mL  Status:  Discontinued        500 mg 100 mL/hr over 60 Minutes Intravenous Every T-Th-Sa (Hemodialysis) 12/22/21 0842 12/24/21 1134   12/18/21 0945  vancomycin (VANCOREADY) IVPB 750 mg/150 mL        750 mg 150 mL/hr over 60 Minutes Intravenous  Once 12/18/21 0845 12/18/21 1138   12/17/21 2200  ceFEPIme (MAXIPIME) 1 g in sodium chloride 0.9 % 100 mL IVPB  Status:  Discontinued        1 g 200 mL/hr over 30 Minutes Intravenous Every 24 hours 12/17/21 0129 12/17/21 0326   12/17/21 1800  cefTRIAXone (ROCEPHIN) 1 g in sodium chloride 0.9 % 100 mL IVPB  Status:  Discontinued        1 g 200 mL/hr over 30 Minutes Intravenous Every 24 hours 12/17/21 0326 12/17/21 1631   12/17/21 1445  vancomycin (VANCOREADY) IVPB 750 mg/150 mL  Status:  Discontinued        750 mg 150 mL/hr over 60 Minutes Intravenous Every T-Th-Sa (Hemodialysis) 12/17/21 1432 12/22/21 0842   12/17/21 1400  metroNIDAZOLE (FLAGYL) tablet 500 mg  Status:  Discontinued        500 mg Oral Every 8 hours 12/17/21 1345 12/17/21 1631    12/17/21 1200  vancomycin (VANCOREADY) IVPB 750 mg/150 mL  Status:  Discontinued        750 mg 150 mL/hr over 60 Minutes Intravenous Every T-Th-Sa (Hemodialysis) 12/17/21 0129 12/17/21 0326   12/17/21 0130  vancomycin (VANCOREADY) IVPB 1500 mg/300 mL        1,500 mg 150 mL/hr over 120 Minutes Intravenous  Once 12/17/21 0115 12/17/21 0537   12/17/21 0115  ceFEPIme (MAXIPIME) 2 g in sodium chloride 0.9 % 100 mL IVPB        2 g 200 mL/hr over 30 Minutes Intravenous  Once 12/17/21 0113 12/17/21 0315   12/17/21 0115  metroNIDAZOLE (FLAGYL) IVPB 500 mg        500 mg 100 mL/hr over 60 Minutes Intravenous  Once 12/17/21 0113 12/17/21 0526   12/17/21 0115  vancomycin (VANCOCIN) IVPB 1000 mg/200 mL premix  Status:  Discontinued        1,000 mg 200 mL/hr over 60 Minutes Intravenous  Once 12/17/21 0113 12/17/21 0115        MEDICATIONS: Scheduled Meds:  apixaban   Does not apply Once   apixaban  10 mg Oral BID   Followed by   Derrill Memo ON 01/01/2022] apixaban  5 mg Oral BID   Chlorhexidine Gluconate Cloth  6 each Topical Q0600   darbepoetin (ARANESP) injection - DIALYSIS  40 mcg Intravenous Q Thu-HD   hydrALAZINE  25 mg Oral Q8H   polyethylene glycol  34 g Oral Daily   senna-docusate  2 tablet Oral BID   verapamil  120 mg Oral Q8H   Continuous Infusions:  vancomycin 500 mg (12/24/21 1736)   PRN Meds:.acetaminophen **OR** acetaminophen, albuterol, hydrALAZINE, naLOXone (NARCAN)  injection, oxyCODONE, polyethylene glycol   I have personally reviewed following labs and imaging studies  LABORATORY DATA: CBC: Recent Labs  Lab 12/19/21 0644 12/23/21 0126 12/24/21 0508  WBC 12.2* 6.9 7.5  HGB 10.4* 9.7* 9.8*  HCT 30.3* 28.4* 29.4*  MCV 92.9 93.1 93.9  PLT 220 169 193     Basic Metabolic Panel: Recent Labs  Lab 12/19/21 0630 12/21/21 0645 12/23/21 0126 12/24/21 0508  NA 132* 128* 128* 130*  K 3.4* 3.7 4.2 4.3  CL 93* 93* 93* 94*  CO2 21* 23 21* 18*  GLUCOSE 101* 92 92 91   BUN 60* 55* 74* 81*  CREATININE 6.98* 6.58* 8.04* 8.86*  CALCIUM 9.5 9.2 9.4 9.3  PHOS 5.9* 7.1*  --  10.2*     GFR: Estimated Creatinine Clearance: 5.5 mL/min (A) (by C-G formula based on SCr of 8.86 mg/dL (H)).  Liver Function Tests: Recent Labs  Lab 12/19/21 0630 12/21/21 0645 12/24/21 0508  ALBUMIN 2.0* 2.0* 2.1*    No results for input(s): LIPASE, AMYLASE in the last 168 hours. No results for input(s): AMMONIA in the last 168 hours.  Coagulation Profile: No results for input(s): INR, PROTIME in the last 168 hours.  Cardiac Enzymes: No results for input(s): CKTOTAL, CKMB, CKMBINDEX, TROPONINI in the last 168 hours.  BNP (last 3 results) No results for input(s): PROBNP in the last 8760 hours.  Lipid Profile: No results for input(s): CHOL, HDL, LDLCALC, TRIG, CHOLHDL, LDLDIRECT in the last 72 hours.  Thyroid Function Tests: No results for input(s): TSH, T4TOTAL, FREET4, T3FREE, THYROIDAB in the last 72 hours.  Anemia Panel: No results for input(s): VITAMINB12, FOLATE, FERRITIN, TIBC, IRON, RETICCTPCT in the last 72 hours.  Urine analysis:    Component Value Date/Time   COLORURINE STRAW (A) 10/01/2021 1234   APPEARANCEUR CLEAR 10/01/2021 1234   LABSPEC 1.009 10/01/2021 1234   PHURINE 7.0 10/01/2021 1234   GLUCOSEU 50 (A) 10/01/2021 1234   HGBUR NEGATIVE 10/01/2021 1234   BILIRUBINUR NEGATIVE 10/01/2021 1234   KETONESUR NEGATIVE 10/01/2021 1234   PROTEINUR 100 (A) 10/01/2021 1234   UROBILINOGEN 0.2 09/27/2021 1329   NITRITE NEGATIVE 10/01/2021 1234   LEUKOCYTESUR TRACE (A) 10/01/2021 1234    Sepsis Labs: Lactic Acid, Venous    Component Value Date/Time   LATICACIDVEN 1.8 12/17/2021 0755    MICROBIOLOGY: Recent Results (from the past 240 hour(s))  Resp Panel by RT-PCR (Flu A&B, Covid) Nasopharyngeal Swab     Status: None   Collection Time: 12/16/21  6:02 PM   Specimen: Nasopharyngeal Swab; Nasopharyngeal(NP) swabs in vial transport medium  Result  Value Ref Range Status   SARS Coronavirus 2 by RT  PCR NEGATIVE NEGATIVE Final    Comment: (NOTE) SARS-CoV-2 target nucleic acids are NOT DETECTED.  The SARS-CoV-2 RNA is generally detectable in upper respiratory specimens during the acute phase of infection. The lowest concentration of SARS-CoV-2 viral copies this assay can detect is 138 copies/mL. A negative result does not preclude SARS-Cov-2 infection and should not be used as the sole basis for treatment or other patient management decisions. A negative result may occur with  improper specimen collection/handling, submission of specimen other than nasopharyngeal swab, presence of viral mutation(s) within the areas targeted by this assay, and inadequate number of viral copies(<138 copies/mL). A negative result must be combined with clinical observations, patient history, and epidemiological information. The expected result is Negative.  Fact Sheet for Patients:  EntrepreneurPulse.com.au  Fact Sheet for Healthcare Providers:  IncredibleEmployment.be  This test is no t yet approved or cleared by the [place] FDA and  has been authorized for detection and/or diagnosis of SARS-CoV-2 by FDA under an Emergency Use Authorization (EUA). This EUA will remain  in effect (meaning this test can be used) for the duration of the COVID-19 declaration under Section 564(b)(1) of the Act, 21 U.S.C.section 360bbb-3(b)(1), unless the authorization is terminated  or revoked sooner.       Influenza A by PCR NEGATIVE NEGATIVE Final   Influenza B by PCR NEGATIVE NEGATIVE Final    Comment: (NOTE) The Xpert Xpress SARS-CoV-2/FLU/RSV plus assay is intended as an aid in the diagnosis of influenza from Nasopharyngeal swab specimens and should not be used as a sole basis for treatment. Nasal washings and aspirates are unacceptable for Xpert Xpress SARS-CoV-2/FLU/RSV testing.  Fact Sheet for  Patients: EntrepreneurPulse.com.au  Fact Sheet for Healthcare Providers: IncredibleEmployment.be  This test is not yet approved or cleared by the [place] FDA and has been authorized for detection and/or diagnosis of SARS-CoV-2 by FDA under an Emergency Use Authorization (EUA). This EUA will remain in effect (meaning this test can be used) for the duration of the COVID-19 declaration under Section 564(b)(1) of the Act, 21 U.S.C. section 360bbb-3(b)(1), unless the authorization is terminated or revoked.  Performed at [facility], [place] [address] [place], [place] [address]   Urine Culture     Status: None   Collection Time: 12/16/21 11:49 PM   Specimen: Urine, Clean Catch  Result Value Ref Range Status   Specimen Description URINE, CLEAN CATCH  Final   Special Requests NONE  Final   Culture   Final    NO GROWTH Performed at [facility], [place] [address] [place], [place] [address]    Report Status 12/17/2021 FINAL  Final  Culture, blood (routine x 2)     Status: Abnormal   Collection Time: 12/17/21  1:14 AM   Specimen: BLOOD LEFT HAND  Result Value Ref Range Status   Specimen Description BLOOD LEFT HAND  Final   Special Requests   Final    BOTTLES DRAWN AEROBIC AND ANAEROBIC Blood Culture adequate volume   Culture  Setup Time   Final    GRAM POSITIVE COCCI IN CLUSTERS IN BOTH AEROBIC AND ANAEROBIC BOTTLES CRITICAL VALUE NOTED.  VALUE IS CONSISTENT WITH PREVIOUSLY REPORTED AND CALLED VALUE.    Culture (A)  Final    STAPHYLOCOCCUS AUREUS SUSCEPTIBILITIES PERFORMED ON PREVIOUS CULTURE WITHIN THE LAST 5 DAYS. Performed at [facility], [place] [address] [place], [place] [address]    Report Status 12/19/2021 FINAL  Final  Culture, blood (routine x  2)     Status: Abnormal   Collection Time: 12/17/21  1:19 AM   Specimen: BLOOD  Result Value Ref Range Status   Specimen Description BLOOD SITE NOT SPECIFIED  Final    Special Requests   Final    BOTTLES DRAWN AEROBIC AND ANAEROBIC Blood Culture results may not be optimal due to an inadequate volume of blood received in culture bottles   Culture  Setup Time   Final    GRAM POSITIVE COCCI IN CLUSTERS IN BOTH AEROBIC AND ANAEROBIC BOTTLES CRITICAL RESULT CALLED TO, READ BACK BY AND VERIFIED WITHLoletha Grayer AMEND PHARMD [id number] 12/17/21 A BROWNING Performed at [facility], [place] [address], [place], [place] [address]    Culture METHICILLIN RESISTANT STAPHYLOCOCCUS AUREUS (A)  Final   Report Status 12/19/2021 FINAL  Final   Organism ID, Bacteria METHICILLIN RESISTANT STAPHYLOCOCCUS AUREUS  Final      Susceptibility   Methicillin resistant staphylococcus aureus - MIC*    CIPROFLOXACIN <=0.5 SENSITIVE Sensitive     ERYTHROMYCIN <=0.25 SENSITIVE Sensitive     GENTAMICIN <=0.5 SENSITIVE Sensitive     OXACILLIN RESISTANT Resistant     TETRACYCLINE >=16 RESISTANT Resistant     VANCOMYCIN <=0.5 SENSITIVE Sensitive     TRIMETH/SULFA <=10 SENSITIVE Sensitive     CLINDAMYCIN <=0.25 SENSITIVE Sensitive     RIFAMPIN <=0.5 SENSITIVE Sensitive     Inducible Clindamycin NEGATIVE Sensitive     * METHICILLIN RESISTANT STAPHYLOCOCCUS AUREUS  Blood Culture ID Panel (Reflexed)     Status: Abnormal   Collection Time: 12/17/21  1:19 AM  Result Value Ref Range Status   Enterococcus faecalis NOT DETECTED NOT DETECTED Final   Enterococcus Faecium NOT DETECTED NOT DETECTED Final   Listeria monocytogenes NOT DETECTED NOT DETECTED Final   Staphylococcus species DETECTED (A) NOT DETECTED Final    Comment: CRITICAL RESULT CALLED TO, READ BACK BY AND VERIFIED WITH: C AMEND PHARMD [id number] 12/17/21 A BROWNING    Staphylococcus aureus (BCID) DETECTED (A) NOT DETECTED Final    Comment: Methicillin (oxacillin)-resistant Staphylococcus aureus (MRSA). MRSA is predictably resistant to beta-lactam antibiotics (except ceftaroline). Preferred therapy is vancomycin unless clinically contraindicated.  Patient requires contact precautions if  hospitalized. CRITICAL RESULT CALLED TO, READ BACK BY AND VERIFIED WITH: C AMEND PHARMD [id number] 12/17/21 A BROWNING    Staphylococcus epidermidis NOT DETECTED NOT DETECTED Final   Staphylococcus lugdunensis NOT DETECTED NOT DETECTED Final   Streptococcus species NOT DETECTED NOT DETECTED Final   Streptococcus agalactiae NOT DETECTED NOT DETECTED Final   Streptococcus pneumoniae NOT DETECTED NOT DETECTED Final   Streptococcus pyogenes NOT DETECTED NOT DETECTED Final   A.calcoaceticus-baumannii NOT DETECTED NOT DETECTED Final   Bacteroides fragilis NOT DETECTED NOT DETECTED Final   Enterobacterales NOT DETECTED NOT DETECTED Final   Enterobacter cloacae complex NOT DETECTED NOT DETECTED Final   Escherichia coli NOT DETECTED NOT DETECTED Final   Klebsiella aerogenes NOT DETECTED NOT DETECTED Final   Klebsiella oxytoca NOT DETECTED NOT DETECTED Final   Klebsiella pneumoniae NOT DETECTED NOT DETECTED Final   Proteus species NOT DETECTED NOT DETECTED Final   Salmonella species NOT DETECTED NOT DETECTED Final   Serratia marcescens NOT DETECTED NOT DETECTED Final   Haemophilus influenzae NOT DETECTED NOT DETECTED Final   Neisseria meningitidis NOT DETECTED NOT DETECTED Final   Pseudomonas aeruginosa NOT DETECTED NOT DETECTED Final   Stenotrophomonas maltophilia NOT DETECTED NOT DETECTED Final   Candida albicans NOT DETECTED NOT DETECTED Final   Candida auris NOT  DETECTED NOT DETECTED Final   Candida glabrata NOT DETECTED NOT DETECTED Final   Candida krusei NOT DETECTED NOT DETECTED Final   Candida parapsilosis NOT DETECTED NOT DETECTED Final   Candida tropicalis NOT DETECTED NOT DETECTED Final   Cryptococcus neoformans/gattii NOT DETECTED NOT DETECTED Final   Meth resistant mecA/C and MREJ DETECTED (A) NOT DETECTED Final    Comment: CRITICAL RESULT CALLED TO, READ BACK BY AND VERIFIED WITHLoletha Grayer AMEND PHARMD [id number] 12/17/21 A BROWNING Performed at [facility], [place] [address] [place], [place] [address]   MRSA Next Gen by PCR, Nasal     Status: Abnormal   Collection Time: 12/19/21 12:40 AM   Specimen: Nasal Mucosa; Nasal Swab  Result Value Ref Range Status   MRSA by PCR Next Gen DETECTED (A) NOT DETECTED Final    Comment: RESULT CALLED TO, READ BACK BY AND VERIFIED WITH: RN M.ENDISON 12/19/21@5 :45 BY TW (NOTE) The GeneXpert MRSA Assay (FDA approved for NASAL specimens only), is one component of a comprehensive MRSA colonization surveillance program. It is not intended to diagnose MRSA infection nor to guide or monitor treatment for MRSA infections. Test performance is not FDA approved in patients less than 93 years old. Performed at [facility], [place] [address] [place], [place] [address]   Culture, blood (routine x 2)     Status: None   Collection Time: 12/19/21  6:32 AM   Specimen: BLOOD  Result Value Ref Range Status   Specimen Description BLOOD LEFT ANTECUBITAL  Final   Special Requests   Final    BOTTLES DRAWN AEROBIC ONLY Blood Culture results may not be optimal due to an inadequate volume of blood received in culture bottles   Culture   Final    NO GROWTH 5 DAYS Performed at [facility], [place] [address] [place], [place] [address]    Report Status 12/24/2021 FINAL  Final  Culture, blood (routine x 2)     Status: None   Collection Time: 12/19/21  6:43 AM   Specimen: BLOOD RIGHT HAND  Result Value Ref Range Status   Specimen Description BLOOD RIGHT HAND  Final   Special Requests   Final    BOTTLES DRAWN AEROBIC ONLY Blood Culture results may not be optimal due to an inadequate volume of blood received in culture bottles   Culture   Final    NO GROWTH 5 DAYS Performed at [facility], [place] [address], [place], [place] [address]    Report Status 12/24/2021 FINAL  Final    RADIOLOGY STUDIES/RESULTS: DG Chest Port 1 View  Result Date: 12/23/2021 CLINICAL DATA:  Status post dialysis catheter placement  EXAM: PORTABLE CHEST 1 VIEW COMPARISON:  12/18/2011 FINDINGS: Right jugular dialysis catheter is noted in satisfactory position. Cardiac shadow is mildly enlarged but stable. Tortuous thoracic aorta is noted. Lungs are clear. No pneumothorax is noted. IMPRESSION: No acute abnormality following dialysis catheter placement. Electronically Signed   By: Inez Catalina M.D.   On: 12/23/2021 19:09   DG C-Arm 1-60 Min-No Report  Result Date: 12/23/2021 Fluoroscopy was utilized by the requesting physician.  No radiographic interpretation.     LOS: 8 days   Oren Binet, MD  [facility]    To contact the attending provider between 7A-7P or the covering provider during after hours 7P-7A, please log into the web site [email or web address] and access using universal  password for that web site. If you do not have the  password, please call the hospital operator.  12/25/2021, 12:03 PM

## 2021-12-25 NOTE — Progress Notes (Signed)
[facility] Progress Note   Subjective:    Seen and examined patient at bedside. Tolerated yesterday's HD with net UF 2.4L. Patient wondering about discharge. Reviewed Hospitalist note. Awaiting SNF/insurance authorization.  Objective Vitals:   12/25/21 0400 12/25/21 0656 12/25/21 0803 12/25/21 1206  BP: (!) 152/90  127/83 107/87  Pulse: 90  (!) 104 76  Resp: 17     Temp: 98.9 F (37.2 C)  100 F (37.8 C) 98.9 F (37.2 C)  TempSrc: Oral  Oral Oral  SpO2: 94%  100%   Weight:  61.1 kg    Height:       Physical Exam General: Up in recliner; NAD Heart: Normal S1 and S2; No murmurs, gallops, or rubs Lungs: Clear throughout; No wheezing, rales, or rhonchi Abdomen: Soft and non-tender Extremities: No edema BLLE Dialysis Access: R IJ TDC (new); L AVF (+) Bruit/Thrill (new)   Filed Weights   12/24/21 1430 12/24/21 1840 12/25/21 0656  Weight: 64.2 kg 61.8 kg 61.1 kg    Intake/Output Summary (Last 24 hours) at 12/25/2021 1244 Last data filed at 12/25/2021 0654 Gross per 24 hour  Intake 280 ml  Output 2589 ml  Net -2309 ml    Additional Objective Labs: Basic Metabolic Panel: Recent Labs  Lab 12/19/21 0630 12/21/21 0645 12/23/21 0126 12/24/21 0508  NA 132* 128* 128* 130*  K 3.4* 3.7 4.2 4.3  CL 93* 93* 93* 94*  CO2 21* 23 21* 18*  GLUCOSE 101* 92 92 91  BUN 60* 55* 74* 81*  CREATININE 6.98* 6.58* 8.04* 8.86*  CALCIUM 9.5 9.2 9.4 9.3  PHOS 5.9* 7.1*  --  10.2*   Liver Function Tests: Recent Labs  Lab 12/19/21 0630 12/21/21 0645 12/24/21 0508  ALBUMIN 2.0* 2.0* 2.1*   No results for input(s): LIPASE, AMYLASE in the last 168 hours. CBC: Recent Labs  Lab 12/19/21 0644 12/23/21 0126 12/24/21 0508  WBC 12.2* 6.9 7.5  HGB 10.4* 9.7* 9.8*  HCT 30.3* 28.4* 29.4*  MCV 92.9 93.1 93.9  PLT 220 169 193   Blood Culture    Component Value Date/Time   SDES BLOOD RIGHT HAND 12/19/2021 0643   SPECREQUEST  12/19/2021 5573    BOTTLES DRAWN AEROBIC  ONLY Blood Culture results may not be optimal due to an inadequate volume of blood received in culture bottles   CULT  12/19/2021 0643    NO GROWTH 5 DAYS Performed at [facility], [place] [address], [place], [place] [address]    REPTSTATUS 12/24/2021 FINAL 12/19/2021 4270    Cardiac Enzymes: No results for input(s): CKTOTAL, CKMB, CKMBINDEX, TROPONINI in the last 168 hours. CBG: No results for input(s): GLUCAP in the last 168 hours. Iron Studies: No results for input(s): IRON, TIBC, TRANSFERRIN, FERRITIN in the last 72 hours. Lab Results  Component Value Date   INR 1.0 10/16/2019   Studies/Results: DG Chest Port 1 View  Result Date: 12/23/2021 CLINICAL DATA:  Status post dialysis catheter placement EXAM: PORTABLE CHEST 1 VIEW COMPARISON:  12/18/2011 FINDINGS: Right jugular dialysis catheter is noted in satisfactory position. Cardiac shadow is mildly enlarged but stable. Tortuous thoracic aorta is noted. Lungs are clear. No pneumothorax is noted. IMPRESSION: No acute abnormality following dialysis catheter placement. Electronically Signed   By: Inez Catalina M.D.   On: 12/23/2021 19:09   DG C-Arm 1-60 Min-No Report  Result Date: 12/23/2021 Fluoroscopy was utilized by the requesting physician.  No radiographic interpretation.    Medications:  vancomycin 500 mg (  12/24/21 1736)    apixaban   Does not apply Once   apixaban  10 mg Oral BID   Followed by   Derrill Memo ON 01/01/2022] apixaban  5 mg Oral BID   Chlorhexidine Gluconate Cloth  6 each Topical Q0600   darbepoetin (ARANESP) injection - DIALYSIS  40 mcg Intravenous Q Thu-HD   hydrALAZINE  25 mg Oral Q8H   polyethylene glycol  34 g Oral Daily   senna-docusate  2 tablet Oral BID   verapamil  120 mg Oral Q8H    Dialysis Orders: TTS HD at [facility] 4h  400/600  68.5kg  3/2 bath  Hep 2000   - hect 4 ug tiw - venofer 50 q wk - no esa, last Hb 11.9  Assessment/Plan: Sepsis/ MRSA bacteremia - suspected HD cath  infection/endocarditis. As well concern for vertebral osteo.  ID recommended IV vanc and line holiday. Repeat blood Cx 1/14 neg to date. TTE showed MV vegetation, recommended TEE.   TEE - neg for MV vegetation but R atrial mass seen. Felt to be thrombus related to HD cath and will need 6 months of [facility] TDC removed by [facility] on 1/14 for line holiday. New TDC + AVF placed 1/18.  Vancomycin with HD for 8 weeks (end date 02/13/22)  ESRD -HD TTS. Had HD 1/18 after TDC placed. Tolerated yesterday's HD with net UF 2.4L. Plan for HD 12/26/21 per usual schedule.  Access - New R IJ TDC + L BB 1st stage AVF placed 1/18 Dr. Stanford Breed  HTN/volume -BP acceptable.  Last post weight not recorded.  Anemia -Hgb 9.8. Start ESA -Aranep 40 with HD tomorrow.  Secondary hyperparathyroidism -Corrected Ca elevated. Phos is high. Hold vitamin D with ^ Ca ,used 2.0 calcium bath. Doesn't appear patient is on binders currently, will start Renvela with meals. Right-sided pulmonary nodule.  follow-up intervention per primary/pulm Lower back pain.  Possible osteo-/discitis on MRI LS. Pain management per primary   Diamond Poet, NP [facility] 12/25/2021,12:44 PM  LOS: 8 days

## 2021-12-25 NOTE — Progress Notes (Addendum)
Informed by RN-the patient tachycardic-rhythm looks like atrial flutter with RVR-rate in the 140s..  Reviewed prior hospitalizations note-Per documentation-patient had atrial fibrillation in 2020 and was briefly on Eliquis.  She feels a little weak but is otherwise doing okay.  BP stable.  Will give IV Cardizem push and start Cardizem infusion.  Already on Eliquis. Hold verapamil for now-as patient will be on Cardizem.  Cardiology consulted.    Continue to closely monitor in telemetry

## 2021-12-25 NOTE — TOC Progression Note (Signed)
Transition of Care [facility]) - Progression Note    Patient Details  Name: Diamond [name] MRN: [id number] Date of Birth: 07-19-1951  Transition of [facility]) CM/SW [place], LCSW Phone Number: 12/25/2021, 5:18 PM  Clinical Narrative:    CSW sent updated therapy note to [facility] for insurance.    Expected Discharge Plan: [place] Barriers to Discharge: Continued Medical Work up, Ship broker, SNF Pending bed offer  Expected Discharge Plan and Services Expected Discharge Plan: [place] In-house Referral: Clinical Social Work   Post Acute Care Choice: [place] Living arrangements for the past 2 months: Single Family Home                                       Social Determinants of Health (SDOH) Interventions    Readmission Risk Interventions No flowsheet data found.

## 2021-12-25 NOTE — Plan of Care (Signed)

## 2021-12-25 NOTE — Progress Notes (Signed)
RN + NT + PT at bedside. Patient found in room bleeding profusely from right hand. Patient assessed. Patient  IV out of place. IV catheter discarded; patient gown changed and patient cleaned. MD notified; IV consult team notified. Patient heart rate elevated during this time to 140 with atrial flutter. Cardizem 20mg  given per MD order.Will continue to monitor

## 2021-12-25 NOTE — Progress Notes (Signed)
Occupational Therapy Treatment Patient Details Name: Diamond [name] MRN: [id number] DOB: December 27, 1950 Today's Date: 12/25/2021   History of present illness Pt is a 71 y.o. F who presents 12/16/2021 with severe sepsis after presenting from home complaining of right sided back pain. Blood culture showing MRSA. 2D echo significant for possible mitral valve vegetation. Lumbar/thoracic spine MRI significant for discitis/osteomyelitis. Significant PMH: ESRD, asthma, HTN.   OT comments  OT treatment session with focus on self-care re-education, functional transfers and OOB activity tolerance. Patient progressed from bed level to recliner with Mod A for bed mobility and Mod-Max A for squat-pivot transfer. Max coaxing/encouragement and max multimodal cues required for sequencing/technique. Patient declined standing with RW secondary to back pain. Patient continues to be limited by decreased decreased cognition, decreased balance and generalized weakness/debility and would benefit from continued acute OT services in prep for safe d/c to next level of care. Recommendation for SNF remains appropriate.    Recommendations for follow up therapy are one component of a multi-disciplinary discharge planning process, led by the attending physician.  Recommendations may be updated based on patient status, additional functional criteria and insurance authorization.    Follow Up Recommendations  Skilled nursing-short term rehab (<3 hours/day)    Assistance Recommended at Discharge Frequent or constant Supervision/Assistance  Patient can return home with the following  A lot of help with walking and/or transfers;A lot of help with bathing/dressing/bathroom;Assistance with cooking/housework;Assist for transportation;Help with stairs or ramp for entrance   Equipment Recommendations  Other (comment) (Defer to next level of care)    Recommendations for Other Services      Precautions / Restrictions  Precautions Precautions: Fall Restrictions Weight Bearing Restrictions: No       Mobility Bed Mobility Overal bed mobility: Needs Assistance Bed Mobility: Rolling, Sidelying to Sit Rolling: Mod assist Sidelying to sit: Max assist       General bed mobility comments: Mod A for rolling to L with multimodal cues for sequencing, hand placement and log rolling technique. Requires increased time/effort. Max A to elevate trunk with cues as indicated above.    Transfers Overall transfer level: Needs assistance Equipment used: None Transfers: Bed to chair/wheelchair/BSC     Squat pivot transfers: Mod assist, Max assist       General transfer comment: Increased coaxing/encouragement required. Mod A initially progressing to Max A for safe descent to recliner. Max multimodal and repeat cues for hand placement and sequencing/technique.     Balance Overall balance assessment: Needs assistance Sitting-balance support: Feet supported Sitting balance-Leahy Scale: Fair Sitting balance - Comments: Able to maintain static sitting balance at EOB with close supervision A. Patient resting forearms on knees for pain management without LOB.                                   ADL either performed or assessed with clinical judgement   ADL Overall ADL's : Needs assistance/impaired                     Lower Body Dressing: Maximal assistance;Sit to/from stand Lower Body Dressing Details (indicate cue type and reason): Max A to don footwear seated EOB. Pain a limiting factor. Toilet Transfer: Maximal Public house manager Details (indicate cue type and reason): Simulated with squat-pivot to recliner with Mod A and Max multimodal cues. Repeat verbal commands and increased time given to process verbal information.  Functional mobility during ADLs:  (at bed level)      Extremity/Trunk Assessment              Vision       Perception      Praxis      Cognition Arousal/Alertness: Awake/alert Behavior During Therapy: WFL for tasks assessed/performed, Anxious Overall Cognitive Status: Impaired/Different from baseline Area of Impairment: Following commands, Safety/judgement, Awareness, Problem solving, Memory                     Memory: Decreased short-term memory Following Commands: Follows one step commands inconsistently Safety/Judgement: Decreased awareness of safety Awareness: Intellectual Problem Solving: Slow processing, Decreased initiation, Requires verbal cues General Comments: Follows 1-step verbal commnads with poor accuracy; decreased safety awareness; patient likes to do things her way        Exercises      Shoulder Instructions       General Comments VSS on RA.    Pertinent Vitals/ Pain       Pain Assessment Pain Assessment: Faces Faces Pain Scale: Hurts whole lot Pain Location: Low back with movement Pain Descriptors / Indicators: Grimacing, Sharp, Crying Pain Intervention(s): Limited activity within patient's tolerance, Monitored during session, Repositioned  Home Living                                          Prior Functioning/Environment              Frequency  Min 2X/week        Progress Toward Goals  OT Goals(current goals can now be found in the care plan section)  Progress towards OT goals: Progressing toward goals (Limited 2/2 pain in low back)  Acute Rehab OT Goals Patient Stated Goal: To decrease pain OT Goal Formulation: With patient Time For Goal Achievement: 01/03/22 Potential to Achieve Goals: Good ADL Goals Pt Will Perform Grooming: with modified independence;standing Pt Will Perform Lower Body Dressing: with modified independence;sit to/from stand Pt Will Transfer to Toilet: with modified independence;ambulating Additional ADL Goal #1: Pt will indep verbalize at least 3 energy conservation strategies to apply to the home setting   Plan Discharge plan remains appropriate;Frequency remains appropriate    Co-evaluation                 AM-PAC OT "6 Clicks" Daily Activity     Outcome Measure   Help from another person eating meals?: None Help from another person taking care of personal grooming?: A Little Help from another person toileting, which includes using toliet, bedpan, or urinal?: A Lot Help from another person bathing (including washing, rinsing, drying)?: A Lot Help from another person to put on and taking off regular upper body clothing?: A Lot Help from another person to put on and taking off regular lower body clothing?: A Lot 6 Click Score: 15    End of Session    OT Visit Diagnosis: Unsteadiness on feet (R26.81);Other abnormalities of gait and mobility (R26.89);Muscle weakness (generalized) (M62.81);Pain Pain - part of body:  (low back)   Activity Tolerance Patient tolerated treatment well   Patient Left with call bell/phone within reach;in chair;with chair alarm set   Nurse Communication Mobility status        Time: 5284-1324 OT Time Calculation (min): 26 min  Charges: OT General Charges $OT Visit: 1 Visit OT Treatments $Therapeutic Activity: 23-37 mins  Gloris Manchester OTR/L Supplemental OT, Department of rehab services [phone]  Verdella Laidlaw R H. 12/25/2021, 10:40 AM

## 2021-12-25 NOTE — Consult Note (Signed)
Cardiology Consultation:   Patient ID: Diamond [name] MRN: [id number]; DOB: [birth year]  Admit date: 12/16/2021 Date of Consult: 12/25/2021  PCP:  Diamond Mccreedy, MD   [facility] Providers Cardiologist:  None        Patient Profile:   Diamond [name] is a 71 y.o. female with a hx of ESRD on HD, ashtma, HTN, anemia of chronic disease who is being seen 12/25/2021 for the evaluation of Aflutter at the request of Dr. Sloan [name].  History of Present Illness:   Ms. Diamond [name] is a 71 year old female with history of ESRD on HD and previous known cardiac history who presented to [facility] ED with right sided back pain found to have severe sepsis with MRSA bacteremia, L5-S1 discitis and right atrial/IVC mass 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 vs vegetation. Her course has been complicated by new onset atrial flutter for which Cardiology has been consulted.  As detailed above, the patient presented with back pain found to be bacteremic with MRSA with evidence of L5-S1 discitis. She underwent TEE on 12/22/21 which revealed a 2.1x1.4cm multilobulated mobile mass attached to the RA/IVC junction 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 vs vegtation at prior HD catheter site. She has been managed with IV vancomycin as well as apixaban for AC. She was preparing to go to SNF but developed Aflutter with RVR for which Cardiology has been consulted.  Currently, the patient remains in Diamond [name] with RVR with HR 130-150s on dilt gtt. She denies any palpitations, chest pain, shortness of breath or lightheadedness. She is very upset that this is interfering with her being discharged from the hospital.  Past Medical History:  Diagnosis Date   Asthma    Atrial flutter with rapid ventricular response ([place]) 12/25/2021   Chronic kidney disease    dialysis Tues Thurs Sat   Dysrhythmia    PAF 10/2019 in setting of COVID-19 PNA   Gout    Hypertension     Past Surgical History:  Procedure Laterality Date   AV FISTULA PLACEMENT Left 12/23/2021   Procedure: LEFT ARM  BRACHIOBASILIC VEIN ARTERIOVENOUS (AV) FISTULA CREATION;  Surgeon: [name], MD;  Location: [place];  Service: Vascular;  Laterality: Left;   [place] Right 12/23/2021   Procedure: INSERTION OF DIALYSIS CATHETER;  Surgeon: [name], MD;  Location: [place];  Service: Vascular;  Laterality: Right;   TEE WITHOUT CARDIOVERSION N/A 12/22/2021   Procedure: TRANSESOPHAGEAL ECHOCARDIOGRAM (TEE);  Surgeon: Jerline Pain, MD;  Location: [facility];  Service: Cardiovascular;  Laterality: N/A;   ULTRASOUND GUIDANCE FOR VASCULAR ACCESS  12/23/2021   Procedure: ULTRASOUND GUIDANCE FOR VASCULAR ACCESS;  Surgeon: [name], MD;  Location: [facility] OR;  Service: Vascular;;     Home Medications:  Prior to Admission medications   Medication Sig Start Date End Date Taking? Authorizing Provider  albuterol (VENTOLIN HFA) 108 (90 Base) MCG/ACT inhaler Inhale 2 puffs into the lungs every 4 (four) hours as needed for wheezing or shortness of breath. 10/08/19  Yes [provider]  allopurinol (ZYLOPRIM) 100 MG tablet Take 200 mg by mouth daily. 12/03/21  Yes [provider]  colchicine 0.6 MG tablet Take 0.6 mg by mouth daily as needed (gout).   Yes [provider]  ondansetron (ZOFRAN) 4 MG tablet Take 4 mg by mouth every 8 (eight) hours as needed. 09/17/21  Yes [provider]  verapamil (CALAN) 120 MG tablet Take 120 mg by mouth 3 (three) times daily.  Yes [provider]  diclofenac Sodium (VOLTAREN) 1 % GEL Apply 2 g topically 4 (four) times daily. Patient not taking: Reported on 12/02/2021 10/01/21   Margette Fast, MD  predniSONE (DELTASONE) 10 MG tablet Take 10 mg by mouth daily. Patient not taking: Reported on 12/17/2021 08/26/21   [provider]  traMADol (ULTRAM) 50 MG tablet Take 1 tablet (50 mg total) by mouth every 6 (six) hours as needed. Patient not taking: Reported on 10/02/2021 09/10/21    Drenda Freeze, MD    Inpatient Medications: Scheduled Meds:  apixaban   Does not apply Once   apixaban  10 mg Oral BID   Followed by   Derrill Memo ON 01/01/2022] apixaban  5 mg Oral BID   Chlorhexidine Gluconate Cloth  6 each Topical Q0600   darbepoetin (ARANESP) injection - DIALYSIS  40 mcg Intravenous Q Thu-HD   hydrALAZINE  25 mg Oral Q8H   polyethylene glycol  34 g Oral Daily   senna-docusate  2 tablet Oral BID   sevelamer carbonate  1,600 mg Oral TID WC   Continuous Infusions:  diltiazem (CARDIZEM) infusion 10 mg/hr (12/25/21 1717)   vancomycin 500 mg (12/24/21 1736)   PRN Meds: acetaminophen **OR** acetaminophen, albuterol, hydrALAZINE, naLOXone (NARCAN)  injection, oxyCODONE, polyethylene glycol  Allergies:   No Known Allergies  Social History:   Social History   Socioeconomic History   Marital status: Widowed    Spouse name: Not on file   Number of children: Not on file   Years of education: Not on file   Highest education level: Not on file  Occupational History   Not on file  Tobacco Use   Smoking status: Never   Smokeless tobacco: Never  Vaping Use   Vaping Use: Never used  Substance and Sexual Activity   Alcohol use: No   Drug use: No   Sexual activity: Not Currently    Birth control/protection: Post-menopausal  Other Topics Concern   Not on file  Social History Narrative   Not on file   Social Determinants of Health   Financial Resource Strain: Not on file  Food Insecurity: Not on file  Transportation Needs: Not on file  Physical Activity: Not on file  Stress: Not on file  Social Connections: Not on file  Intimate Partner Violence: Not on file    Family History:    Family History  Problem Relation Age of Onset   Hypertension Mother    Hypertension Father    Colon cancer Brother    Lung cancer Brother      ROS:  Please see the history of present illness.  Review of Systems  Constitutional:  Positive for malaise/fatigue.   Respiratory:  Negative for shortness of breath.   Cardiovascular:  Negative for chest pain, palpitations, orthopnea, claudication, leg swelling and PND.  Musculoskeletal:  Positive for back pain.  Neurological:  Negative for dizziness and loss of consciousness.      Physical Exam/Data:   Vitals:   12/25/21 0400 12/25/21 0656 12/25/21 0803 12/25/21 1206  BP: (!) 152/90  127/83 107/87  Pulse: 90  (!) 104 76  Resp: 17     Temp: 98.9 F (37.2 C)  100 F (37.8 C) 98.9 F (37.2 C)  TempSrc: Oral  Oral Oral  SpO2: 94%  100%   Weight:  61.1 kg    Height:        Intake/Output Summary (Last 24 hours) at 12/25/2021 1723 Last data filed at 12/25/2021 1717  Gross per 24 hour  Intake 285.42 ml  Output 2589 ml  Net -2303.58 ml   Last 3 Weights 12/25/2021 12/24/2021 12/24/2021  Weight (lbs) 134 lb 11.2 oz 136 lb 3.9 oz 141 lb 8.6 oz  Weight (kg) 61.1 kg 61.8 kg 64.2 kg     Body mass index is 21.1 kg/m.  General:  Well nourished, well developed, in no acute distress HEENT: normal Neck: no JVD Vascular: No carotid bruits; Distal pulses 2+ bilaterally Cardiac:  Tachycardic, regular, no murmurs Lungs:  clear to auscultation bilaterally, no wheezing, rhonchi or rales  Abd: soft, nontender, no hepatomegaly  Ext: no edema Musculoskeletal:  No deformities, BUE and BLE strength normal and equal Skin: warm and dry  Neuro:  CNs 2-12 intact, no focal abnormalities noted Psych:  Normal affect   EKG:  The EKG was personally reviewed and demonstrates:  Aflutter with RBBB, LAFB, HR 141 Telemetry:  Telemetry was personally reviewed and demonstrates:  Aflutter with RVR  Relevant CV Studies:  TEE 12/22/21: IMPRESSIONS   1. There is a 2.1 x 1.37 mutilobulated mobile mass attached to the RA/IVC  junction which has the appearance of thrombus/vegetation in the setting of  bacteremia and recently removed dialysis catheter.   2. Left ventricular ejection fraction, by estimation, is 60 to 65%. The  left  ventricle has normal function. The left ventricle has no regional  wall motion abnormalities.   3. Right ventricular systolic function is normal. The right ventricular  size is normal.   4. No left atrial/left atrial appendage thrombus was detected.   5. No mitral valve vegetation. The mitral valve is degenerative. Trivial  mitral valve regurgitation. No evidence of mitral stenosis.   6. The aortic valve is normal in structure. Aortic valve regurgitation is  not visualized. No aortic stenosis is present.   7. The inferior vena cava is normal in size with greater than 50%  respiratory variability, suggesting right atrial pressure of 3 mmHg.   TTE 12/18/21: IMPRESSIONS   1. Left ventricular ejection fraction, by estimation, is 60 to 65%. The  left ventricle has normal function. The left ventricle has no regional  wall motion abnormalities. There is moderate hypertrophy of the basal  septum. The rest of the LV segments  demonstrate mild left ventricular hypertrophy. Left ventricular diastolic  parameters are consistent with Grade I diastolic dysfunction (impaired  relaxation).   2. Right ventricular systolic function is normal. The right ventricular  size is normal. There is normal pulmonary artery systolic pressure.   3. The mitral valve leaflets are thickened. There are fibrinous densities  visualized on the anterior mitral valve leaflet on the apical 3C view.  Given underlying bacteremia, findings concerning for vegetation formation  (versus degenerative valve  changes). There is trivial mitral regurgitation and no evidence of valve  destruction. Recommend TEE for further evaluation.   4. The aortic valve is tricuspid. There is mild calcification of the  aortic valve. There is moderate thickening of the aortic valve. Aortic  valve regurgitation is trivial. Aortic valve sclerosis/calcification is  present, without any evidence of aortic  stenosis.   5. Aortic dilatation noted. There  is mild dilatation of the ascending  aorta, measuring 40 mm.   6. There is a highly mobile, fibrinous structure visualized in the RA  most likely a eustachian valve or chiari network.   Comparison(s): Compared to prior TTE in 11/2019, the mitral valve leaflets  continue to be thickened, however, there is concern for possible  vegetation on current study. Otherwise, the TR appears mild (previously  mod-to-severe).   Laboratory Data:  High Sensitivity Troponin:   Recent Labs  Lab 12/24/21 0039 12/24/21 0508  TROPONINIHS 24* 28*     Chemistry Recent Labs  Lab 12/21/21 0645 12/23/21 0126 12/24/21 0508  NA 128* 128* 130*  K 3.7 4.2 4.3  CL 93* 93* 94*  CO2 23 21* 18*  GLUCOSE 92 92 91  BUN 55* 74* 81*  CREATININE 6.58* 8.04* 8.86*  CALCIUM 9.2 9.4 9.3  GFRNONAA 6* 5* 4*  ANIONGAP 12 14 18*    Recent Labs  Lab 12/19/21 0630 12/21/21 0645 12/24/21 0508  ALBUMIN 2.0* 2.0* 2.1*   Lipids No results for input(s): CHOL, TRIG, HDL, LABVLDL, LDLCALC, CHOLHDL in the last 168 hours.  Hematology Recent Labs  Lab 12/19/21 0644 12/23/21 0126 12/24/21 0508  WBC 12.2* 6.9 7.5  RBC 3.26* 3.05* 3.13*  HGB 10.4* 9.7* 9.8*  HCT 30.3* 28.4* 29.4*  MCV 92.9 93.1 93.9  MCH 31.9 31.8 31.3  MCHC 34.3 34.2 33.3  RDW 17.1* 17.5* 17.2*  PLT 220 169 193   Thyroid No results for input(s): TSH, FREET4 in the last 168 hours.  BNPNo results for input(s): BNP, PROBNP in the last 168 hours.  DDimer No results for input(s): DDIMER in the last 168 hours.   Radiology/Studies:  CT CHEST WO CONTRAST  Result Date: 12/22/2021 CLINICAL DATA:  Follow-up 6-8 mm lung nodule left lower lobe present on CT chest in 2011. Shortness of breath. Follow-up abnormal x-ray. EXAM: CT CHEST WITHOUT CONTRAST TECHNIQUE: Multidetector CT imaging of the chest was performed following the standard protocol without IV contrast. RADIATION DOSE REDUCTION: This exam was performed according to the departmental  dose-optimization program which includes automated exposure control, adjustment of the mA and/or kV according to patient size and/or use of iterative reconstruction technique. COMPARISON:  Chest radiographs 12/17/2021, CT chest 10/15/2010 FINDINGS: Cardiovascular: Heart size is mildly enlarged. Coronary artery and aortic root calcifications are noted. The ascending aorta measures up to 4.0 cm in caliber, borderline aneurysmal. This is increased from 3.6 cm on prior CT. Moderate calcifications within the aortic arch. Mediastinum/Nodes: No axillary, mediastinal, or hilar pathologically enlarged lymph nodes by CT criteria. 9 mm short axis inferior right paratracheal lymph node is similar to prior. The thyroid gland is grossly unremarkable. The esophagus follows a normal course of normal caliber. Lungs/Pleura: The central airways are patent. A 6 x 5 mm left lower lobe pulmonary nodule is unchanged from 10/15/2010 and benign. There is an elongated, tubular soft tissue density within the lateral middle lobe (axial 100/4, sagittal 34/6) measuring up to 7 x 7 x 14 mm (transverse by AP by craniocaudal). Due to the elongated shape, it is unclear whether this may represent a vascular lesion or airway impaction, however this does not appear to involve an airway. This is new compared to the prior remote 10/15/2010 CT. There is a mild-to-moderate right pleural effusion. Question mild nodularity of the adjacent posterior right lower lobe pleura, however this appearance may just be secondary to subsegmental atelectasis. Recommend attention on follow-up. There is a 4 mm nodule within the lingula that may be slightly more distinct, however previously at least a ground-glass density was seen in this same region on remote 10/15/2010 CT (current CT axial 95/4). There are two nearby peripheral left upper lobe apparent cystic/cavitary nodules measuring approximately 4 mm (axial image 62) and 4 mm (axial image 58). These may be secondary  to  an infectious process. Punctate calcified granuloma is seen at the far inferior aspect of the left lower lobe (axial image 135), benign. Punctate posteromedial left lower lobe 3 mm nodule (axial image 118). Punctate benign calcified granuloma within left lower lobe (axial image 116). There is curvilinear subsegmental atelectasis versus scarring within the peripheral superior aspect of the left upper lobe. No pneumothorax. Upper Abdomen: The gallbladder is partially visualized. Therer appears to be a low-density gallstone measuring at least 19 mm. The gallbladder wall is not well evaluated on the current noncontrast CT with only partial visualization of the gallbladder. If there are symptoms of right upper quadrant pain, a right upper quadrant abdominal ultrasound may help for further characterization. There is a 2 mm right upper pole renal stone. There are punctate calcifications within the lateral aspect of the spleen. Musculoskeletal: There is again nonspecific but apparently stable mild nodularity within the right breast just deep to the right nipple. There is again benign dystrophic calcification within the left breast similar to prior. Mild to moderate multilevel degenerative disc changes of the visualized thoracic spine. IMPRESSION: Compared to 10/15/2010: 1. The previously seen left lower lobe 5 mm nodule is unchanged from prior, demonstrating 11 year stability. This is benign. 2. There is an elongated, tubular soft tissue density within the lateral middle lobe measuring 7 x 7 x 14 mm. This is indeterminate and could represent a vascular abnormality or a pulmonary nodule. Recommend follow-up CT in 3 months to assess stability. A PET-CT also may be considered for further evaluation. If the nodules are stable at time of repeat CT, then future CT at 18-24 months (from today's scan) is considered optional for low-risk patients, but is recommended for high-risk patients. This recommendation follows the  consensus statement: Guidelines for Management of Incidental Pulmonary Nodules Detected on CT Images: From the Fleischner Society 2017; Radiology 2017; 284:228-243. 3. Additional left pulmonary nodules measuring up to 4 mm. Recommend attention on follow-up. 4. Mild-to-moderate right pleural effusion. Question mild nodularity of the adjacent posterior right lower lobe pleura, however this appearance may just be secondary to subsegmental atelectasis. Recommend attention on follow-up. Electronically Signed   By: Yvonne Kendall M.D.   On: 12/22/2021 10:11   DG Chest Port 1 View  Result Date: 12/23/2021 CLINICAL DATA:  Status post dialysis catheter placement EXAM: PORTABLE CHEST 1 VIEW COMPARISON:  12/18/2011 FINDINGS: Right jugular dialysis catheter is noted in satisfactory position. Cardiac shadow is mildly enlarged but stable. Tortuous thoracic aorta is noted. Lungs are clear. No pneumothorax is noted. IMPRESSION: No acute abnormality following dialysis catheter placement. Electronically Signed   By: Inez Catalina M.D.   On: 12/23/2021 19:09   DG C-Arm 1-60 Min-No Report  Result Date: 12/23/2021 Fluoroscopy was utilized by the requesting physician.  No radiographic interpretation.   ECHO TEE  Result Date: 12/22/2021    TRANSESOPHOGEAL ECHO REPORT   Patient Name:   KEZIYAH KNEALE Date of Exam: 12/22/2021 Medical Rec #:  [id number]      Height:       67.0 in Accession #:    [id number]     Weight:       142.2 lb Date of Birth:  20-Apr-1951     BSA:          1.749 m Patient Age:    45 years       BP:           124/76 mmHg Patient Gender: F  HR:           75 bpm. Exam Location:  Inpatient Procedure: Transesophageal Echo, 3D Echo, Cardiac Doppler and Color Doppler Indications:     Bacteremia  History:         Patient has prior history of Echocardiogram examinations, most                  recent 12/18/2021. Risk Factors:Hypertension.  Sonographer:     Bernadene Person RDCS Referring Phys:  [id number]  Leanor Kail Diagnosing Phys: Candee Furbish MD PROCEDURE: After discussion of the risks and benefits of a TEE, an informed consent was obtained from the patient. The transesophogeal probe was passed without difficulty through the esophogus of the patient. Sedation performed by different physician. The patient was monitored while under deep sedation. Anesthestetic sedation was provided intravenously by Anesthesiology: 512.14mg  of Propofol, 60mg  of Lidocaine. The patient's vital signs; including heart rate, blood pressure, and oxygen saturation; remained stable throughout the procedure. The patient developed no complications during the procedure. IMPRESSIONS  1. There is a 2.1 x 1.37 mutilobulated mobile mass attached to the RA/IVC junction which has the appearance of thrombus/vegetation in the setting of bacteremia and recently removed dialysis catheter.  2. Left ventricular ejection fraction, by estimation, is 60 to 65%. The left ventricle has normal function. The left ventricle has no regional wall motion abnormalities.  3. Right ventricular systolic function is normal. The right ventricular size is normal.  4. No left atrial/left atrial appendage thrombus was detected.  5. No mitral valve vegetation. The mitral valve is degenerative. Trivial mitral valve regurgitation. No evidence of mitral stenosis.  6. The aortic valve is normal in structure. Aortic valve regurgitation is not visualized. No aortic stenosis is present.  7. The inferior vena cava is normal in size with greater than 50% respiratory variability, suggesting right atrial pressure of 3 mmHg. FINDINGS  Left Ventricle: Left ventricular ejection fraction, by estimation, is 60 to 65%. The left ventricle has normal function. The left ventricle has no regional wall motion abnormalities. The left ventricular internal cavity size was normal in size. There is  no left ventricular hypertrophy. Right Ventricle: The right ventricular size is normal. No  increase in right ventricular wall thickness. Right ventricular systolic function is normal. Left Atrium: Left atrial size was normal in size. No left atrial/left atrial appendage thrombus was detected. Right Atrium: Right atrial size was normal in size. Pericardium: There is no evidence of pericardial effusion. Mitral Valve: No mitral valve vegetation. The mitral valve is degenerative in appearance. There is mild thickening of the mitral valve leaflet(s). There is mild calcification of the mitral valve leaflet(s). Trivial mitral valve regurgitation. No evidence  of mitral valve stenosis. Tricuspid Valve: The tricuspid valve is normal in structure. Tricuspid valve regurgitation is not demonstrated. No evidence of tricuspid stenosis. Aortic Valve: The aortic valve is normal in structure. Aortic valve regurgitation is not visualized. No aortic stenosis is present. Pulmonic Valve: The pulmonic valve was normal in structure. Pulmonic valve regurgitation is not visualized. No evidence of pulmonic stenosis. Aorta: The aortic root is normal in size and structure. Venous: The inferior vena cava is normal in size with greater than 50% respiratory variability, suggesting right atrial pressure of 3 mmHg. IAS/Shunts: No atrial level shunt detected by color flow Doppler. Additional Comments: There is a 2.1 x 1.37 mutilobulated mobile mass attached to the RA/IVC junction which has the appearance of thrombus/vegetation in the setting of bacteremia and recently removed dialysis  catheter. Candee Furbish MD Electronically signed by Candee Furbish MD Signature Date/Time: 12/22/2021/3:48:22 PM    Final      Assessment and Plan:   #Aflutter with RVR: CHADs-vasc 4. Patient developed new onset Aflutter with RVR this afternoon with HR 140s. She is relatively asymptomatic. TTE with LVEF 60-65%, moderate LVH, G1DD, normal RV. Has been on apixaban for RA mass as detailed below. Currently on dilt gtt with persistently elevated rates.  -Change  from dilt gtt to amiodarone bolus +gtt -Start metop 25mg  BID -Continue apixaban for [facility]  #Right atrial mass with suspected thrombus vs vegetation: Noted on TTE on 12/22/21 as detailed above. Suspicious for thrombus given that it occurred at prior HD line site vs vegetation given MRSA bacteremia (vs both). Was recommended for 6 months of apixaban but given Aflutter as detailed above, will need AC long-term for stroke prevention. -Management of MRSA bacteremia with endocarditis as detailed below -Continue apixaban for [facility]  #MRSA Bacteremia with L5-S1 Discitis: #Suspected IE: TEE without obvious valvular vegetations but with evidence of RA/IVC junction mass as detailed above. Given evidence of embolic phenomena, likely has underlying endocarditis. On vanc per ID. -Continue vanc per ID  #ESRD: HD line removed due to bacteremia and right IJ TDC and left brachial AV fistula placed by vascular surgery. -Continue scheduled HD  #Anemia of Chronic Disease: -Management per nephrology  #HTN: -On hydralazine   Risk Assessment/Risk Scores:          CHA2DS2-VASc Score = 4  { This indicates a 4.8% annual risk of stroke. The patient's score is based upon: CHF History: 0 HTN History: 1 Diabetes History: 0 Stroke History: 0 Vascular Disease History: 1 Age Score: 1 Gender Score: 1         For questions or updates, please contact [place] Please consult [email or web address] for contact info under    Signed, Freada Bergeron, MD  12/25/2021 5:23 PM

## 2021-12-25 NOTE — Progress Notes (Signed)
Physical Therapy Treatment Patient Details Name: Diamond [name] MRN: [id number] DOB: 03-Sep-1951 Today's Date: 12/25/2021   History of Present Illness Pt is a 71 y.o. F who presents 12/16/2021 with severe sepsis after presenting from home complaining of right sided back pain. Blood culture showing MRSA. 2D echo significant for possible mitral valve vegetation. Lumbar/thoracic spine MRI significant for discitis/osteomyelitis. Significant PMH: ESRD, asthma, HTN.    PT Comments    Pt reports her RUE is bleeding when PT arrives, has palm upward.  Went to hall to get nursing, and pt was assisted to get blood off her person and the surfaces where she is sitting.  Pt had her IV out of back of hand, was assisted by nursing to get hemostasis.  Pt is quite weak suddenly and asking to return to bed.  When assisted to move, began to get tachycardic and was in SVT with rates up to 150.  Assistance to pivot and return to bed, BP was checked with no orthostatic values noted.  Follow along with her to return to more independent status and with good control of CVS.  Pt assisted to reposition on the bed, nursing assessing vitals and communicating with MD.      Recommendations for follow up therapy are one component of a multi-disciplinary discharge planning process, led by the attending physician.  Recommendations may be updated based on patient status, additional functional criteria and insurance authorization.  Follow Up Recommendations  Skilled nursing-short term rehab (<3 hours/day)     Assistance Recommended at Discharge Frequent or constant Supervision/Assistance  Patient can return home with the following Two people to help with walking and/or transfers;A lot of help with bathing/dressing/bathroom;Assistance with cooking/housework;Direct supervision/assist for medications management;Direct supervision/assist for financial management;Assist for transportation;Help with stairs or ramp for entrance   Equipment  Recommendations  BSC/3in1;Wheelchair (measurements PT);Wheelchair cushion (measurements PT);Hospital bed    Recommendations for Other Services       Precautions / Restrictions Precautions Precautions: Fall Precaution Comments: SVT monitor HR Restrictions Weight Bearing Restrictions: No     Mobility  Bed Mobility Overal bed mobility: Needs Assistance Bed Mobility: Sit to Supine, Rolling Rolling: Mod assist     Sit to supine: Max assist   General bed mobility comments: Pt was in SVT, became very weak    Transfers Overall transfer level: Needs assistance Equipment used: Rolling walker (2 wheels) Transfers: Sit to/from Stand, Bed to chair/wheelchair/BSC Sit to Stand: Mod assist, +2 physical assistance Stand pivot transfers: Max assist, +2 physical assistance, +2 safety/equipment Step pivot transfers: Max assist, +2 physical assistance, +2 safety/equipment       General transfer comment: pt was having very elevated HR to return to bed, PT and nursing max assist to get up and step/pivot to chair while pt reports she cannot do more    Ambulation/Gait               General Gait Details: unable   Stairs             Wheelchair Mobility    Modified Rankin (Stroke Patients Only)       Balance Overall balance assessment: Needs assistance Sitting-balance support: Feet supported Sitting balance-Leahy Scale: Poor   Postural control: Posterior lean, Right lateral lean Standing balance support: Bilateral upper extremity supported, During functional activity Standing balance-Leahy Scale: Poor Standing balance comment: two staff members to support stand and moving  Cognition Arousal/Alertness: Awake/alert Behavior During Therapy: Anxious, Flat affect Overall Cognitive Status: Impaired/Different from baseline Area of Impairment: Problem solving, Awareness, Safety/judgement, Following commands, Memory, Attention,  Orientation                 Orientation Level: Situation Current Attention Level: Selective Memory: Decreased short-term memory Following Commands: Follows one step commands with increased time Safety/Judgement: Decreased awareness of safety, Decreased awareness of deficits Awareness: Intellectual Problem Solving: Slow processing, Decreased initiation, Requires verbal cues, Requires tactile cues General Comments: pt began to go into SVT as PT and nursing were working together to get her to bed, HR up to 150        Exercises      General Comments General comments (skin integrity, edema, etc.): stable vitals then was very high with no warning      Pertinent Vitals/Pain Pain Assessment Pain Assessment: Faces Faces Pain Scale: Hurts little more Facial Expression: Relaxed, neutral Body Movements: Protection Muscle Tension: Tense, rigid Pain Location: back in chair and moving Pain Descriptors / Indicators: Guarding Pain Intervention(s): Monitored during session, Repositioned    Home Living                          Prior Function            PT Goals (current goals can now be found in the care plan section) Acute Rehab PT Goals Patient Stated Goal: go to bed    Frequency    Min 2X/week      PT Plan Current plan remains appropriate    Co-evaluation              AM-PAC PT "6 Clicks" Mobility   Outcome Measure  Help needed turning from your back to your side while in a flat bed without using bedrails?: A Lot Help needed moving from lying on your back to sitting on the side of a flat bed without using bedrails?: A Lot Help needed moving to and from a bed to a chair (including a wheelchair)?: Total Help needed standing up from a chair using your arms (e.g., wheelchair or bedside chair)?: Total Help needed to walk in hospital room?: Total Help needed climbing 3-5 steps with a railing? : Total 6 Click Score: 8    End of Session Equipment  Utilized During Treatment: Gait belt Activity Tolerance: Patient limited by fatigue;Treatment limited secondary to medical complications (Comment);No increased pain Patient left: in bed;with call bell/phone within reach;with bed alarm set;with nursing/sitter in room Nurse Communication: Mobility status;Other (comment) (discussion about her HR findings) PT Visit Diagnosis: Muscle weakness (generalized) (M62.81);Difficulty in walking, not elsewhere classified (R26.2);Pain Pain - part of body:  (back)     Time: 1422-1500 PT Time Calculation (min) (ACUTE ONLY): 38 min  Charges:  $Therapeutic Activity: 23-37 mins $Neuromuscular Re-education: 8-22 mins          Ramond Dial 12/25/2021, 3:17 PM  Mee Hives, PT PhD Acute Rehab Dept. Number: [facility] [place]

## 2021-12-26 ENCOUNTER — Inpatient Hospital Stay (HOSPITAL_COMMUNITY): Payer: Medicare HMO

## 2021-12-26 DIAGNOSIS — M4620 Osteomyelitis of vertebra, site unspecified: Secondary | ICD-10-CM | POA: Diagnosis not present

## 2021-12-26 DIAGNOSIS — A419 Sepsis, unspecified organism: Secondary | ICD-10-CM | POA: Diagnosis not present

## 2021-12-26 DIAGNOSIS — T82838A Hemorrhage of vascular prosthetic devices, implants and grafts, initial encounter: Secondary | ICD-10-CM

## 2021-12-26 DIAGNOSIS — N186 End stage renal disease: Secondary | ICD-10-CM | POA: Diagnosis not present

## 2021-12-26 DIAGNOSIS — R7881 Bacteremia: Secondary | ICD-10-CM | POA: Diagnosis not present

## 2021-12-26 LAB — CBC
HCT: 31.4 % — ABNORMAL LOW (ref 36.0–46.0)
HCT: 30 % — ABNORMAL LOW (ref 36.0–46.0)
HCT: 20.1 % — ABNORMAL LOW (ref 36.0–46.0)
Hemoglobin: 10.4 g/dL — ABNORMAL LOW (ref 12.0–15.0)
Hemoglobin: 10.3 g/dL — ABNORMAL LOW (ref 12.0–15.0)
Hemoglobin: 7 g/dL — ABNORMAL LOW (ref 12.0–15.0)
MCH: 30.5 pg (ref 26.0–34.0)
MCH: 32 pg (ref 26.0–34.0)
MCH: 32 pg (ref 26.0–34.0)
MCHC: 33.1 g/dL (ref 30.0–36.0)
MCHC: 34.3 g/dL (ref 30.0–36.0)
MCHC: 34.8 g/dL (ref 30.0–36.0)
MCV: 92.1 fL (ref 80.0–100.0)
MCV: 93.2 fL (ref 80.0–100.0)
MCV: 91.8 fL (ref 80.0–100.0)
Platelets: 154 K/uL (ref 150–400)
Platelets: 172 10*3/uL (ref 150–400)
Platelets: 138 10*3/uL — ABNORMAL LOW (ref 150–400)
RBC: 3.41 MIL/uL — ABNORMAL LOW (ref 3.87–5.11)
RBC: 3.22 MIL/uL — ABNORMAL LOW (ref 3.87–5.11)
RBC: 2.19 MIL/uL — ABNORMAL LOW (ref 3.87–5.11)
RDW: 17.2 % — ABNORMAL HIGH (ref 11.5–15.5)
RDW: 17.4 % — ABNORMAL HIGH (ref 11.5–15.5)
RDW: 17.4 % — ABNORMAL HIGH (ref 11.5–15.5)
WBC: 9.6 K/uL (ref 4.0–10.5)
WBC: 12.2 10*3/uL — ABNORMAL HIGH (ref 4.0–10.5)
WBC: 15.3 10*3/uL — ABNORMAL HIGH (ref 4.0–10.5)
nRBC: 0 % (ref 0.0–0.2)
nRBC: 0 % (ref 0.0–0.2)
nRBC: 0 % (ref 0.0–0.2)

## 2021-12-26 LAB — RENAL FUNCTION PANEL
Albumin: 2.2 g/dL — ABNORMAL LOW (ref 3.5–5.0)
Anion gap: 13 (ref 5–15)
BUN: 43 mg/dL — ABNORMAL HIGH (ref 8–23)
CO2: 25 mmol/L (ref 22–32)
Calcium: 10 mg/dL (ref 8.9–10.3)
Chloride: 88 mmol/L — ABNORMAL LOW (ref 98–111)
Creatinine, Ser: 5.93 mg/dL — ABNORMAL HIGH (ref 0.44–1.00)
GFR, Estimated: 7 mL/min — ABNORMAL LOW
Glucose, Bld: 106 mg/dL — ABNORMAL HIGH (ref 70–99)
Phosphorus: 7.3 mg/dL — ABNORMAL HIGH (ref 2.5–4.6)
Potassium: 3.6 mmol/L (ref 3.5–5.1)
Sodium: 126 mmol/L — ABNORMAL LOW (ref 135–145)

## 2021-12-26 LAB — PREPARE RBC (CROSSMATCH)

## 2021-12-26 LAB — COMPREHENSIVE METABOLIC PANEL
ALT: 8 U/L (ref 0–44)
AST: 15 U/L (ref 15–41)
Albumin: 1.6 g/dL — ABNORMAL LOW (ref 3.5–5.0)
Alkaline Phosphatase: 49 U/L (ref 38–126)
Anion gap: 11 (ref 5–15)
BUN: 26 mg/dL — ABNORMAL HIGH (ref 8–23)
CO2: 26 mmol/L (ref 22–32)
Calcium: 8.1 mg/dL — ABNORMAL LOW (ref 8.9–10.3)
Chloride: 93 mmol/L — ABNORMAL LOW (ref 98–111)
Creatinine, Ser: 3.83 mg/dL — ABNORMAL HIGH (ref 0.44–1.00)
GFR, Estimated: 12 mL/min — ABNORMAL LOW (ref >60)
Glucose, Bld: 101 mg/dL — ABNORMAL HIGH (ref 70–99)
Potassium: 3.6 mmol/L (ref 3.5–5.1)
Sodium: 130 mmol/L — ABNORMAL LOW (ref 135–145)
Total Bilirubin: 0.4 mg/dL (ref 0.3–1.2)
Total Protein: 5 g/dL — ABNORMAL LOW (ref 6.5–8.1)

## 2021-12-26 LAB — GLUCOSE, CAPILLARY
Glucose-Capillary: 103 mg/dL — ABNORMAL HIGH (ref 70–99)
Glucose-Capillary: 129 mg/dL — ABNORMAL HIGH (ref 70–99)

## 2021-12-26 LAB — LACTIC ACID, PLASMA: Lactic Acid, Venous: 1.6 mmol/L (ref 0.5–1.9)

## 2021-12-26 MED ORDER — SODIUM CHLORIDE 0.9 % IV SOLN: 100.0000 mL | INTRAVENOUS | Status: DC | PRN

## 2021-12-26 MED ORDER — HEPARIN SODIUM (PORCINE) 1000 UNIT/ML DIALYSIS: 1000.0000 [IU] | INTRAMUSCULAR | Status: DC | PRN

## 2021-12-26 MED ORDER — METOPROLOL TARTRATE 25 MG PO TABS
37.5000 mg | ORAL_TABLET | Freq: Two times a day (BID) | ORAL | Status: DC
  Administered 2021-12-27 – 2021-12-28 (×4): 37.5 mg via ORAL
  Filled 2021-12-26 (×4): qty 1

## 2021-12-26 MED ORDER — LIDOCAINE-PRILOCAINE 2.5-2.5 % EX CREA
1.0000 "application " | TOPICAL_CREAM | CUTANEOUS | Status: DC | PRN
  Filled 2021-12-26: qty 5

## 2021-12-26 MED ORDER — APIXABAN 5 MG PO TABS
10.0000 mg | ORAL_TABLET | Freq: Two times a day (BID) | ORAL | Status: DC
Start: 2021-12-27 — End: 2021-12-27

## 2021-12-26 MED ORDER — LIDOCAINE HCL (PF) 1 % IJ SOLN
5.0000 mL | INTRAMUSCULAR | Status: DC | PRN
  Filled 2021-12-26: qty 5

## 2021-12-26 MED ORDER — ACETAMINOPHEN 325 MG PO TABS
650.0000 mg | ORAL_TABLET | Freq: Once | ORAL | Status: AC
  Administered 2021-12-26: 650 mg via ORAL
  Filled 2021-12-26: qty 2

## 2021-12-26 MED ORDER — LIDOCAINE-EPINEPHRINE 1 %-1:100000 IJ SOLN
10.0000 mL | Freq: Once | INTRAMUSCULAR | Status: AC
  Administered 2021-12-26: 10 mL via INTRADERMAL
  Filled 2021-12-26: qty 10

## 2021-12-26 MED ORDER — ALTEPLASE 2 MG IJ SOLR: 2.0000 mg | Freq: Once | INTRAMUSCULAR | Status: DC | PRN

## 2021-12-26 MED ORDER — SODIUM CHLORIDE 0.9% IV SOLUTION: Freq: Once | INTRAVENOUS | Status: AC

## 2021-12-26 MED ORDER — PENTAFLUOROPROP-TETRAFLUOROETH EX AERO: 1.0000 "application " | INHALATION_SPRAY | CUTANEOUS | Status: DC | PRN

## 2021-12-26 MED ORDER — APIXABAN 5 MG PO TABS: 5.0000 mg | ORAL_TABLET | Freq: Two times a day (BID) | ORAL | Status: DC

## 2021-12-26 MED ORDER — DIPHENHYDRAMINE HCL 50 MG/ML IJ SOLN
25.0000 mg | Freq: Once | INTRAMUSCULAR | Status: AC
  Administered 2021-12-26: 25 mg via INTRAVENOUS
  Filled 2021-12-26: qty 1

## 2021-12-26 MED ORDER — HEPARIN SODIUM (PORCINE) 1000 UNIT/ML IJ SOLN
3200.0000 [IU] | INTRAMUSCULAR | Status: DC
  Administered 2021-12-26: 3200 [IU] via INTRAVENOUS
  Filled 2021-12-26: qty 4
  Filled 2021-12-26: qty 3.2

## 2021-12-26 MED ORDER — FUROSEMIDE 10 MG/ML IJ SOLN
20.0000 mg | Freq: Once | INTRAMUSCULAR | Status: AC
  Administered 2021-12-26: 20 mg via INTRAVENOUS
  Filled 2021-12-26: qty 2

## 2021-12-26 NOTE — Progress Notes (Signed)
Reassessed patient-she looks so much better!.  Sitting up in bed-about to start working on her dinner tray.  Much more comfortable-blood pressure stable with systolic in the 720P range.  HD catheter site not bleeding.  PRBC transfusion in progress.  Continue to closely monitor.

## 2021-12-26 NOTE — Progress Notes (Signed)
Rapid response called at 1502 and Dr. Sloan Leiter made aware of pt's declining status while receiving dialysis in room. Pt lethargic, not following commands, and hypotensive. CBG checked 129. Pt placed in Trendelenburg position by dialysis nurse. \  Rapid response and Dr. Sloan Leiter at bedside to assess pt. Stat orders for labs and chest x ray placed.   Nephrology PA came to pt's bedside.

## 2021-12-26 NOTE — Progress Notes (Signed)
Had to stop HD early due to BP's kept dropping into the 70's.  Improved briefly w/ NS boluses.  Got about 1 hour of HD.  She looked better coming off HD w/ BP's up to 105 range. Have d/w pmd, they will orders prbc transfusion given drop in Hb to 7.0 today after bleeding from the [facility].  [facility] did come by and address this as well today.   Kelly Splinter, MD 12/26/2021, 5:52 PM

## 2021-12-26 NOTE — Progress Notes (Signed)
ANTICOAGULATION CONSULT NOTE - Initial Consult  Pharmacy Consult for Apixaban Indication: RA thrombus  No Known Allergies  Patient Measurements: Height: 5\' 7"  (170.2 cm) Weight: 62.8 kg (138 lb 7.2 oz) IBW/kg (Calculated) : 61.6   Vital Signs: Temp: 97.9 F (36.6 C) (01/21 1157) Temp Source: Oral (01/21 1157) BP: 101/65 (01/21 1157) Pulse Rate: 99 (01/21 1157)  Labs: Recent Labs    12/24/21 0039 12/24/21 0508 12/24/21 0508 12/26/21 0059 12/26/21 1036  HGB  --  9.8*   < > 10.4* 10.3*  HCT  --  29.4*  --  31.4* 30.0*  PLT  --  193  --  154 172  CREATININE  --  8.86*  --  5.93*  --   TROPONINIHS 24* 28*  --   --   --    < > = values in this interval not displayed.     Estimated Creatinine Clearance: 8.3 mL/min (A) (by C-G formula based on SCr of 5.93 mg/dL (H)).   Medical History: Past Medical History:  Diagnosis Date   Asthma    Atrial flutter with rapid ventricular response ([place]) 12/25/2021   Chronic kidney disease    dialysis Tues Thurs Sat   Dysrhythmia    PAF 10/2019 in setting of COVID-19 PNA   Gout    Hypertension      [name]  with history of ESRD on HD TTS-presenting on 12/16/21  with sepsis due to MRSA bacteremia. Currently on Vancomycin on HD days,  End date for Vanc is 02/13/22.  A right atrial/IVC globular mobile mass compatible with thrombotic material/vegetative material seen on TEE on 1/17. Dr. Sloan Leiter  discussed this with cardiologist-Dr. Marlou Porch who -probably a thrombus related to HD catheter-recommends at least 6 months of anticoagulation.   Pharmacy consulted to start Apixaban for right atrial thrombus.  She was not on anticoagulation prior to admission.  Copay $45 for 30 day supply  1/21: Patient has been bleeding from catheter site and vascular consulted. Unable to stop bleeding with pressure and rapid response called. Dr. Scot Dock placed a suture to stop the bleeding and applied Dermabond.   Goal of Therapy:  Monitor  platelets by anticoagulation protocol: Yes   Plan:  Hold apixaban 24hr and restart tomorrow per vascular. Give Apixaban 10mg  BID x 7 days then on 12/31/21 reduce dose to 5 mg BID Monitor for s/sx of bleeding  Will educate patient about apixaban prior to discharge.  Varney Daily, PharmD PGY1 Pharmacy Resident  Please check AMION for all [facility] pharmacy phone numbers After 10:00 PM call main pharmacy [phone]

## 2021-12-26 NOTE — Progress Notes (Addendum)
While on HD-patient became hypotensive-lethargic.  This MD came to bedside-during my evaluation-this patient was relatively awake and alert-blood pressure responded to IV fluid bolus.  Stat labs were drawn-hemoglobin has decreased to 7-suspect she may have had acute blood loss anemia from bleeding from her dialysis site earlier today.  Have ordered 1 unit of PRBC transfusion, she is currently awake/alert-appears weak but is otherwise stable.  Dr. Jonnie Finner at bedside a few minutes ago-he has stopped dialysis.  Will continue to monitor closely-hopefully now that hemostasis has been achieved from the bleeding catheter site-her blood pressure will stabilize.  We will repeat CBC after PRBC transfusion.  I updated son/daughter at bedside

## 2021-12-26 NOTE — Progress Notes (Addendum)
PROGRESS NOTE        PATIENT DETAILS Name: Diamond [name] Age: 71 y.o. Sex: female Date of Birth: [birth year] Admit Date: 12/16/2021 Admitting Physician Rhetta Mura, DO JEH:[id number], Iona Beard, MD  Brief Narrative: 71 year old female with history of ESRD on HD TTS-presenting with sepsis due to MRSA bacteremia.  See below for further details   Subjective: Lying comfortably in bed-developed bleeding from HD catheter site earlier this morning.  Granddaughter at bedside.  Objective: Vitals: Blood pressure 97/75, pulse 99, temperature 97.7 F (36.5 C), temperature source Axillary, resp. rate (!) 26, height 5\' 7"  (1.702 m), weight 61.4 kg, SpO2 98 %.   Exam: Gen Exam:Alert awake-not in any distress HEENT:atraumatic, normocephalic Chest: B/L clear to auscultation anteriorly CVS:S1S2 regular Abdomen:soft non tender, non distended Extremities:no edema Neurology: Non focal Skin: no rash   Pertinent Labs/Radiology: CBC Latest Ref Rng & Units 12/26/2021 12/26/2021 12/24/2021  WBC 4.0 - 10.5 K/uL 12.2(H) 9.6 7.5  Hemoglobin 12.0 - 15.0 g/dL 10.3(L) 10.4(L) 9.8(L)  Hematocrit 36.0 - 46.0 % 30.0(L) 31.4(L) 29.4(L)  Platelets 150 - 400 K/uL 172 154 193    Lab Results  Component Value Date   NA 126 (L) 12/26/2021   K 3.6 12/26/2021   CL 88 (L) 12/26/2021   CO2 25 12/26/2021      Assessment/Plan: * Severe sepsis ([place]) due to MRSA bacteremia, L5-S1 discitis and endocarditis- (present on admission) Sepsis physiology has resolved-repeat blood cultures negative so far-  TEE on 1/17 without obvious valvular vegetations-but did have a right atrial/IVC globular mobile mass compatible with thrombotic/vegetative material.  Reasonable to treat like endocarditis with embolic phenomena causing discitis.  ID recommending IV vancomycin-end date-02/13/2022.   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 seen on TEE on 1/17 Discussed with cardiologist-Dr. Marlou Porch  on 1/18-probably a thrombus related to HD catheter-recommends at least 6 months of anticoagulation-however now with atrial flutter-will require indefinite anticoagulation.    After discussion with Dr. Hayden Rasmussen surgery on 1/19-full dose anticoagulation was started.  Unfortunately developed bleeding from HD catheter site-anticoagulation currently on hold.  Atrial flutter with rapid ventricular response-CHA2DS2-VASc 4 Developed RVR on 1/20-initially started on Cardizem infusion-but then transition to amiodarone infusion by cardiology.  Eliquis on hold due to bleeding from dialysis catheter site.  Cardiology recommending that we stop amiodarone and focus on rate control-beta-blocker being uptitrated.  Bleeding from dialysis catheter site Occurred around 6:45 AM this morning-vascular surgery following-Eliquis remains on hold.  Will defer to vascular surgery.  ESRD (end stage renal disease) ([place])- (present on admission) Due to bacteremia given line holiday-dialysis catheter was removed on 1/14, on 1/18-vascular surgery placed right IJ TDC and left brachial AV fistula.  Nephrology following-defer HD care to nephrology.  Anemia of chronic disease- (present on admission) Due to ESRD-hemoglobin stable-defer Aranesp/iron to nephrology.  Hyponatremia- (present on admission) Volume management with HD-follow.  Hypertension- (present on admission) BP stable-no longer on verapamil and hydralazine-as patient not on beta-blocker.  Follow and adjust.    Mild intermittent asthma- (present on admission)  Stable-continue as needed bronchodilators.  Lung nodule seen on imaging study- (present on admission)  will need repeat CT chest in 27-month  BMI: Estimated body mass index is 21.2 kg/m as calculated from the following:   Height as of this encounter: 5\' 7"  (1.702 m).   Weight as of this encounter: 61.4 kg.   Procedures:  1/14>> HD catheter removed 1/17>> TEE 1/18>> right IJ tunnel catheter  placement, left brachiocephalic for stage AV fistula creation   DVT Prophylaxis: Eliquis. Consults: Nephrology, vascular surgery, infectious disease, cardiology Code Status:Full code  Family Communication: None at bedside   Disposition Plan: Status is: Inpatient  Remains inpatient appropriate because: MRSA bacteremia-on IV vancomycin-recommendations are for SNF on discharge    Diet: Diet Order             Diet renal with fluid restriction Fluid restriction: 2000 mL Fluid; Room service appropriate? Yes; Fluid consistency: Thin  Diet effective now                     Antimicrobial agents: Anti-infectives (From admission, onward)    Start     Dose/Rate Route Frequency Ordered Stop   12/26/21 1200  vancomycin (VANCOREADY) IVPB 500 mg/100 mL  Status:  Discontinued        500 mg 100 mL/hr over 60 Minutes Intravenous Every T-Th-Sa (Hemodialysis) 12/24/21 1134 12/24/21 1158   12/24/21 1245  vancomycin (VANCOREADY) IVPB 500 mg/100 mL        500 mg 100 mL/hr over 60 Minutes Intravenous Every T-Th-Sa (Hemodialysis) 12/24/21 1158     12/24/21 1230  vancomycin (VANCOREADY) IVPB 500 mg/100 mL  Status:  Discontinued        500 mg 100 mL/hr over 60 Minutes Intravenous  Once 12/24/21 1134 12/24/21 1147   12/23/21 1456  ceFAZolin (ANCEF) 2-4 GM/100ML-% IVPB  Status:  Discontinued       Note to Pharmacy: Gregery Na H: cabinet override      12/23/21 1456 12/23/21 1515   12/23/21 1200  vancomycin (VANCOREADY) IVPB 500 mg/100 mL  Status:  Discontinued        500 mg 100 mL/hr over 60 Minutes Intravenous  Once 12/22/21 1555 12/23/21 2216   12/22/21 1200  vancomycin (VANCOREADY) IVPB 500 mg/100 mL  Status:  Discontinued        500 mg 100 mL/hr over 60 Minutes Intravenous Every T-Th-Sa (Hemodialysis) 12/22/21 0842 12/24/21 1134   12/18/21 0945  vancomycin (VANCOREADY) IVPB 750 mg/150 mL        750 mg 150 mL/hr over 60 Minutes Intravenous  Once 12/18/21 0845 12/18/21 1138   12/17/21  2200  ceFEPIme (MAXIPIME) 1 g in sodium chloride 0.9 % 100 mL IVPB  Status:  Discontinued        1 g 200 mL/hr over 30 Minutes Intravenous Every 24 hours 12/17/21 0129 12/17/21 0326   12/17/21 1800  cefTRIAXone (ROCEPHIN) 1 g in sodium chloride 0.9 % 100 mL IVPB  Status:  Discontinued        1 g 200 mL/hr over 30 Minutes Intravenous Every 24 hours 12/17/21 0326 12/17/21 1631   12/17/21 1445  vancomycin (VANCOREADY) IVPB 750 mg/150 mL  Status:  Discontinued        750 mg 150 mL/hr over 60 Minutes Intravenous Every T-Th-Sa (Hemodialysis) 12/17/21 1432 12/22/21 0842   12/17/21 1400  metroNIDAZOLE (FLAGYL) tablet 500 mg  Status:  Discontinued        500 mg Oral Every 8 hours 12/17/21 1345 12/17/21 1631   12/17/21 1200  vancomycin (VANCOREADY) IVPB 750 mg/150 mL  Status:  Discontinued        750 mg 150 mL/hr over 60 Minutes Intravenous Every T-Th-Sa (Hemodialysis) 12/17/21 0129 12/17/21 0326   12/17/21 0130  vancomycin (VANCOREADY) IVPB 1500 mg/300 mL        1,500 mg  150 mL/hr over 120 Minutes Intravenous  Once 12/17/21 0115 12/17/21 0537   12/17/21 0115  ceFEPIme (MAXIPIME) 2 g in sodium chloride 0.9 % 100 mL IVPB        2 g 200 mL/hr over 30 Minutes Intravenous  Once 12/17/21 0113 12/17/21 0315   12/17/21 0115  metroNIDAZOLE (FLAGYL) IVPB 500 mg        500 mg 100 mL/hr over 60 Minutes Intravenous  Once 12/17/21 0113 12/17/21 0526   12/17/21 0115  vancomycin (VANCOCIN) IVPB 1000 mg/200 mL premix  Status:  Discontinued        1,000 mg 200 mL/hr over 60 Minutes Intravenous  Once 12/17/21 0113 12/17/21 0115        MEDICATIONS: Scheduled Meds:  apixaban   Does not apply Once   [START ON 12/27/2021] apixaban  10 mg Oral BID   Followed by   Derrill Memo ON 01/01/2022] apixaban  5 mg Oral BID   Chlorhexidine Gluconate Cloth  6 each Topical Q0600   darbepoetin (ARANESP) injection - DIALYSIS  40 mcg Intravenous Q Thu-HD   hydrALAZINE  25 mg Oral Q8H   lidocaine-EPINEPHrine  10 mL Intradermal Once    metoprolol tartrate  37.5 mg Oral BID   polyethylene glycol  34 g Oral Daily   senna-docusate  2 tablet Oral BID   sevelamer carbonate  1,600 mg Oral TID WC   Continuous Infusions:  [START ON 12/27/2021] sodium chloride     [START ON 12/27/2021] sodium chloride     sodium chloride     sodium chloride     vancomycin 500 mg (12/24/21 1736)   PRN Meds:.[START ON 12/27/2021] sodium chloride, [START ON 12/27/2021] sodium chloride, sodium chloride, sodium chloride, acetaminophen **OR** acetaminophen, albuterol, [START ON 12/27/2021] alteplase, [START ON 12/27/2021] heparin, hydrALAZINE, [START ON 12/27/2021] lidocaine (PF), [START ON 12/27/2021] lidocaine-prilocaine, naLOXone (NARCAN)  injection, oxyCODONE, [START ON 12/27/2021] pentafluoroprop-tetrafluoroeth, polyethylene glycol   I have personally reviewed following labs and imaging studies  LABORATORY DATA: CBC: Recent Labs  Lab 12/23/21 0126 12/24/21 0508 12/26/21 0059 12/26/21 1036  WBC 6.9 7.5 9.6 12.2*  HGB 9.7* 9.8* 10.4* 10.3*  HCT 28.4* 29.4* 31.4* 30.0*  MCV 93.1 93.9 92.1 93.2  PLT 169 193 154 893    Basic Metabolic Panel: Recent Labs  Lab 12/21/21 0645 12/23/21 0126 12/24/21 0508 12/26/21 0059  NA 128* 128* 130* 126*  K 3.7 4.2 4.3 3.6  CL 93* 93* 94* 88*  CO2 23 21* 18* 25  GLUCOSE 92 92 91 106*  BUN 55* 74* 81* 43*  CREATININE 6.58* 8.04* 8.86* 5.93*  CALCIUM 9.2 9.4 9.3 10.0  PHOS 7.1*  --  10.2* 7.3*    GFR: Estimated Creatinine Clearance: 8.3 mL/min (A) (by C-G formula based on SCr of 5.93 mg/dL (H)).  Liver Function Tests: Recent Labs  Lab 12/21/21 0645 12/24/21 0508 12/26/21 0059  ALBUMIN 2.0* 2.1* 2.2*   No results for input(s): LIPASE, AMYLASE in the last 168 hours. No results for input(s): AMMONIA in the last 168 hours.  Coagulation Profile: No results for input(s): INR, PROTIME in the last 168 hours.  Cardiac Enzymes: No results for input(s): CKTOTAL, CKMB, CKMBINDEX, TROPONINI in the  last 168 hours.  BNP (last 3 results) No results for input(s): PROBNP in the last 8760 hours.  Lipid Profile: No results for input(s): CHOL, HDL, LDLCALC, TRIG, CHOLHDL, LDLDIRECT in the last 72 hours.  Thyroid Function Tests: No results for input(s): TSH, T4TOTAL, FREET4, T3FREE, THYROIDAB in the  last 72 hours.  Anemia Panel: No results for input(s): VITAMINB12, FOLATE, FERRITIN, TIBC, IRON, RETICCTPCT in the last 72 hours.  Urine analysis:    Component Value Date/Time   COLORURINE STRAW (A) 10/01/2021 1234   APPEARANCEUR CLEAR 10/01/2021 1234   LABSPEC 1.009 10/01/2021 1234   PHURINE 7.0 10/01/2021 1234   GLUCOSEU 50 (A) 10/01/2021 1234   HGBUR NEGATIVE 10/01/2021 1234   BILIRUBINUR NEGATIVE 10/01/2021 1234   KETONESUR NEGATIVE 10/01/2021 1234   PROTEINUR 100 (A) 10/01/2021 1234   UROBILINOGEN 0.2 09/27/2021 1329   NITRITE NEGATIVE 10/01/2021 1234   LEUKOCYTESUR TRACE (A) 10/01/2021 1234    Sepsis Labs: Lactic Acid, Venous    Component Value Date/Time   LATICACIDVEN 1.8 12/17/2021 0755    MICROBIOLOGY: Recent Results (from the past 240 hour(s))  Resp Panel by RT-PCR (Flu A&B, Covid) Nasopharyngeal Swab     Status: None   Collection Time: 12/16/21  6:02 PM   Specimen: Nasopharyngeal Swab; Nasopharyngeal(NP) swabs in vial transport medium  Result Value Ref Range Status   SARS Coronavirus 2 by RT PCR NEGATIVE NEGATIVE Final    Comment: (NOTE) SARS-CoV-2 target nucleic acids are NOT DETECTED.  The SARS-CoV-2 RNA is generally detectable in upper respiratory specimens during the acute phase of infection. The lowest concentration of SARS-CoV-2 viral copies this assay can detect is 138 copies/mL. A negative result does not preclude SARS-Cov-2 infection and should not be used as the sole basis for treatment or other patient management decisions. A negative result may occur with  improper specimen collection/handling, submission of specimen other than nasopharyngeal  swab, presence of viral mutation(s) within the areas targeted by this assay, and inadequate number of viral copies(<138 copies/mL). A negative result must be combined with clinical observations, patient history, and epidemiological information. The expected result is Negative.  Fact Sheet for Patients:  EntrepreneurPulse.com.au  Fact Sheet for Healthcare Providers:  IncredibleEmployment.be  This test is no t yet approved or cleared by the [place] FDA and  has been authorized for detection and/or diagnosis of SARS-CoV-2 by FDA under an Emergency Use Authorization (EUA). This EUA will remain  in effect (meaning this test can be used) for the duration of the COVID-19 declaration under Section 564(b)(1) of the Act, 21 U.S.C.section 360bbb-3(b)(1), unless the authorization is terminated  or revoked sooner.       Influenza A by PCR NEGATIVE NEGATIVE Final   Influenza B by PCR NEGATIVE NEGATIVE Final    Comment: (NOTE) The Xpert Xpress SARS-CoV-2/FLU/RSV plus assay is intended as an aid in the diagnosis of influenza from Nasopharyngeal swab specimens and should not be used as a sole basis for treatment. Nasal washings and aspirates are unacceptable for Xpert Xpress SARS-CoV-2/FLU/RSV testing.  Fact Sheet for Patients: EntrepreneurPulse.com.au  Fact Sheet for Healthcare Providers: IncredibleEmployment.be  This test is not yet approved or cleared by the [place] FDA and has been authorized for detection and/or diagnosis of SARS-CoV-2 by FDA under an Emergency Use Authorization (EUA). This EUA will remain in effect (meaning this test can be used) for the duration of the COVID-19 declaration under Section 564(b)(1) of the Act, 21 U.S.C. section 360bbb-3(b)(1), unless the authorization is terminated or revoked.  Performed at [facility], [place] [address] [place], [place] [address]   Urine  Culture     Status: None   Collection Time: 12/16/21 11:49 PM   Specimen: Urine, Clean Catch  Result Value Ref Range Status   Specimen Description URINE, CLEAN CATCH  Final   Special Requests NONE  Final   Culture   Final    NO GROWTH Performed at [facility], [place] [address] [place], [place] [address]    Report Status 12/17/2021 FINAL  Final  Culture, blood (routine x 2)     Status: Abnormal   Collection Time: 12/17/21  1:14 AM   Specimen: BLOOD LEFT HAND  Result Value Ref Range Status   Specimen Description BLOOD LEFT HAND  Final   Special Requests   Final    BOTTLES DRAWN AEROBIC AND ANAEROBIC Blood Culture adequate volume   Culture  Setup Time   Final    GRAM POSITIVE COCCI IN CLUSTERS IN BOTH AEROBIC AND ANAEROBIC BOTTLES CRITICAL VALUE NOTED.  VALUE IS CONSISTENT WITH PREVIOUSLY REPORTED AND CALLED VALUE.    Culture (A)  Final    STAPHYLOCOCCUS AUREUS SUSCEPTIBILITIES PERFORMED ON PREVIOUS CULTURE WITHIN THE LAST 5 DAYS. Performed at [facility], [place] [address] [place], [place] [address]    Report Status 12/19/2021 FINAL  Final  Culture, blood (routine x 2)     Status: Abnormal   Collection Time: 12/17/21  1:19 AM   Specimen: BLOOD  Result Value Ref Range Status   Specimen Description BLOOD SITE NOT SPECIFIED  Final   Special Requests   Final    BOTTLES DRAWN AEROBIC AND ANAEROBIC Blood Culture results may not be optimal due to an inadequate volume of blood received in culture bottles   Culture  Setup Time   Final    GRAM POSITIVE COCCI IN CLUSTERS IN BOTH AEROBIC AND ANAEROBIC BOTTLES CRITICAL RESULT CALLED TO, READ BACK BY AND VERIFIED WITHLoletha Grayer AMEND PHARMD [id number] 12/17/21 A BROWNING Performed at [facility], [place] [address], [place], [place] [address]    Culture METHICILLIN RESISTANT STAPHYLOCOCCUS AUREUS (A)  Final   Report Status 12/19/2021 FINAL  Final   Organism ID, Bacteria METHICILLIN RESISTANT STAPHYLOCOCCUS AUREUS  Final       Susceptibility   Methicillin resistant staphylococcus aureus - MIC*    CIPROFLOXACIN <=0.5 SENSITIVE Sensitive     ERYTHROMYCIN <=0.25 SENSITIVE Sensitive     GENTAMICIN <=0.5 SENSITIVE Sensitive     OXACILLIN RESISTANT Resistant     TETRACYCLINE >=16 RESISTANT Resistant     VANCOMYCIN <=0.5 SENSITIVE Sensitive     TRIMETH/SULFA <=10 SENSITIVE Sensitive     CLINDAMYCIN <=0.25 SENSITIVE Sensitive     RIFAMPIN <=0.5 SENSITIVE Sensitive     Inducible Clindamycin NEGATIVE Sensitive     * METHICILLIN RESISTANT STAPHYLOCOCCUS AUREUS  Blood Culture ID Panel (Reflexed)     Status: Abnormal   Collection Time: 12/17/21  1:19 AM  Result Value Ref Range Status   Enterococcus faecalis NOT DETECTED NOT DETECTED Final   Enterococcus Faecium NOT DETECTED NOT DETECTED Final   Listeria monocytogenes NOT DETECTED NOT DETECTED Final   Staphylococcus species DETECTED (A) NOT DETECTED Final    Comment: CRITICAL RESULT CALLED TO, READ BACK BY AND VERIFIED WITH: C AMEND PHARMD [id number] 12/17/21 A BROWNING    Staphylococcus aureus (BCID) DETECTED (A) NOT DETECTED Final    Comment: Methicillin (oxacillin)-resistant Staphylococcus aureus (MRSA). MRSA is predictably resistant to beta-lactam antibiotics (except ceftaroline). Preferred therapy is vancomycin unless clinically contraindicated. Patient requires contact precautions if  hospitalized. CRITICAL RESULT CALLED TO, READ BACK BY AND VERIFIED WITH: C AMEND PHARMD [id number] 12/17/21 A BROWNING    Staphylococcus epidermidis NOT DETECTED NOT DETECTED Final   Staphylococcus lugdunensis NOT DETECTED NOT DETECTED  Final   Streptococcus species NOT DETECTED NOT DETECTED Final   Streptococcus agalactiae NOT DETECTED NOT DETECTED Final   Streptococcus pneumoniae NOT DETECTED NOT DETECTED Final   Streptococcus pyogenes NOT DETECTED NOT DETECTED Final   A.calcoaceticus-baumannii NOT DETECTED NOT DETECTED Final   Bacteroides fragilis NOT DETECTED NOT DETECTED Final    Enterobacterales NOT DETECTED NOT DETECTED Final   Enterobacter cloacae complex NOT DETECTED NOT DETECTED Final   Escherichia coli NOT DETECTED NOT DETECTED Final   Klebsiella aerogenes NOT DETECTED NOT DETECTED Final   Klebsiella oxytoca NOT DETECTED NOT DETECTED Final   Klebsiella pneumoniae NOT DETECTED NOT DETECTED Final   Proteus species NOT DETECTED NOT DETECTED Final   Salmonella species NOT DETECTED NOT DETECTED Final   Serratia marcescens NOT DETECTED NOT DETECTED Final   Haemophilus influenzae NOT DETECTED NOT DETECTED Final   Neisseria meningitidis NOT DETECTED NOT DETECTED Final   Pseudomonas aeruginosa NOT DETECTED NOT DETECTED Final   Stenotrophomonas maltophilia NOT DETECTED NOT DETECTED Final   Candida albicans NOT DETECTED NOT DETECTED Final   Candida auris NOT DETECTED NOT DETECTED Final   Candida glabrata NOT DETECTED NOT DETECTED Final   Candida krusei NOT DETECTED NOT DETECTED Final   Candida parapsilosis NOT DETECTED NOT DETECTED Final   Candida tropicalis NOT DETECTED NOT DETECTED Final   Cryptococcus neoformans/gattii NOT DETECTED NOT DETECTED Final   Meth resistant mecA/C and MREJ DETECTED (A) NOT DETECTED Final    Comment: CRITICAL RESULT CALLED TO, READ BACK BY AND VERIFIED WITHLoletha Grayer AMEND PHARMD [id number] 12/17/21 A BROWNING Performed at [facility], [address], [place], [place] [address]   MRSA Next Gen by PCR, Nasal     Status: Abnormal   Collection Time: 12/19/21 12:40 AM   Specimen: Nasal Mucosa; Nasal Swab  Result Value Ref Range Status   MRSA by PCR Next Gen DETECTED (A) NOT DETECTED Final    Comment: RESULT CALLED TO, READ BACK BY AND VERIFIED WITH: RN M.ENDISON 12/19/21@5 :45 BY TW (NOTE) The GeneXpert MRSA Assay (FDA approved for NASAL specimens only), is one component of a comprehensive MRSA colonization surveillance program. It is not intended to diagnose MRSA infection nor to guide or monitor treatment for MRSA infections. Test performance  is not FDA approved in patients less than 65 years old. Performed at [facility], [place] [address], [place], [place] [address]   Culture, blood (routine x 2)     Status: None   Collection Time: 12/19/21  6:32 AM   Specimen: BLOOD  Result Value Ref Range Status   Specimen Description BLOOD LEFT ANTECUBITAL  Final   Special Requests   Final    BOTTLES DRAWN AEROBIC ONLY Blood Culture results may not be optimal due to an inadequate volume of blood received in culture bottles   Culture   Final    NO GROWTH 5 DAYS Performed at [facility], [place] [address], [place], [place] [address]    Report Status 12/24/2021 FINAL  Final  Culture, blood (routine x 2)     Status: None   Collection Time: 12/19/21  6:43 AM   Specimen: BLOOD RIGHT HAND  Result Value Ref Range Status   Specimen Description BLOOD RIGHT HAND  Final   Special Requests   Final    BOTTLES DRAWN AEROBIC ONLY Blood Culture results may not be optimal due to an inadequate volume of blood received in culture bottles   Culture   Final    NO  GROWTH 5 DAYS Performed at [facility], [place] [address], [place], [place] [address]    Report Status 12/24/2021 FINAL  Final    RADIOLOGY STUDIES/RESULTS: No results found.   LOS: 9 days   Oren Binet, MD  [facility]    To contact the attending provider between 7A-7P or the covering provider during after hours 7P-7A, please log into the web site [email or web address] and access using universal [place] password for that web site. If you do not have the password, please call the hospital operator.  12/26/2021, 2:35 PM

## 2021-12-26 NOTE — Assessment & Plan Note (Addendum)
Occurred around 6:45 AM on 1/21-pressure held-eventually seen by vascular surgery-and sutured at bedside.  No further bleeding since then.

## 2021-12-26 NOTE — Progress Notes (Signed)
°  Progress Note    12/26/2021 8:23 AM 3 Days Post-Op  Diamond [name] is s/p left brachiobasilic AV fistula creation and R IJ TDC placement by Dr. Stanford Breed on 12/23/21. Called by RN regarding continuous bleeding from HD catheter site that started earlier this morning. Unable to get it to stop with pressure. Rapid response RN also at bedside. Patient is on Eliquis which was increased to 10 mg BID yesterday. Also received Heparin at dialysis session yesterday. Catheter is intact and well appearing. There is continuous brisk ooz from catheter exit site. Dressings removed. Pressure held for approximately 10 minutes. Dressings remained unsaturated. Pressure dressing applied. Will need to continue to monitor. Repositioned patient so that her head is elevated. Please continue to keep head elevated. Hold Eliquis for now until bleeding controlled. Morning Labs pending   [facility] [phone] 12/26/2021 8:23 AM

## 2021-12-26 NOTE — Progress Notes (Signed)
[facility] Progress Note   Subjective:    Notified from HD RN of patient refusing HD. Also, patient's TDC bled overnight. Seen and examined patient at bedside. Patient's grand-daughter also at bedside. She appeared slow to respond. She reports weakness in BLUEs. Denies SOB and CP. Seen by VVS this AM. Order to hold AM dose Eliquis and keep head elevated. TDC dressing currently CDI. Spoke to both Hospitalist and Dr. Jonnie Finner. Patient converted into Afib/A-flutter yesterday afternoon. Currently on Amiodarone drip. No focal deficits noted. Patient now resting comfortably. She now consents to HD. Plan for HD today at bedside.  Objective Vitals:   12/26/21 0725 12/26/21 1157 12/26/21 1400 12/26/21 1415  BP: (!) 144/91 101/65  97/75  Pulse: 85 99    Resp: 18 20 (!) 22 (!) 26  Temp: 98.2 F (36.8 C) 97.9 F (36.6 C)  97.7 F (36.5 C)  TempSrc: Oral Oral  Axillary  SpO2: 98%     Weight:      Height:       Physical Exam General: Lying in bed; NAD Heart: Normal S1 and S2; No murmurs, gallops, or rubs Lungs: Clear throughout; No wheezing, rales, or rhonchi Abdomen: Soft and non-tender Extremities: No edema BLLE Dialysis Access: R IJ TDC (new)-no bleeding noted; dressing clean, dry, and intact; L AVF (+) Bruit/Thrill (new)   Filed Weights   12/24/21 1840 12/25/21 0656 12/26/21 0600  Weight: 61.8 kg 61.1 kg 62.8 kg    Intake/Output Summary (Last 24 hours) at 12/26/2021 1425 Last data filed at 12/26/2021 0640 Gross per 24 hour  Intake 245.42 ml  Output 35 ml  Net 210.42 ml    Additional Objective Labs: Basic Metabolic Panel: Recent Labs  Lab 12/21/21 0645 12/23/21 0126 12/24/21 0508 12/26/21 0059  NA 128* 128* 130* 126*  K 3.7 4.2 4.3 3.6  CL 93* 93* 94* 88*  CO2 23 21* 18* 25  GLUCOSE 92 92 91 106*  BUN 55* 74* 81* 43*  CREATININE 6.58* 8.04* 8.86* 5.93*  CALCIUM 9.2 9.4 9.3 10.0  PHOS 7.1*  --  10.2* 7.3*   Liver Function Tests: Recent Labs  Lab  12/21/21 0645 12/24/21 0508 12/26/21 0059  ALBUMIN 2.0* 2.1* 2.2*   No results for input(s): LIPASE, AMYLASE in the last 168 hours. CBC: Recent Labs  Lab 12/23/21 0126 12/24/21 0508 12/26/21 0059 12/26/21 1036  WBC 6.9 7.5 9.6 12.2*  HGB 9.7* 9.8* 10.4* 10.3*  HCT 28.4* 29.4* 31.4* 30.0*  MCV 93.1 93.9 92.1 93.2  PLT 169 193 154 172   Blood Culture    Component Value Date/Time   SDES BLOOD RIGHT HAND 12/19/2021 0643   SPECREQUEST  12/19/2021 4696    BOTTLES DRAWN AEROBIC ONLY Blood Culture results may not be optimal due to an inadequate volume of blood received in culture bottles   CULT  12/19/2021 0643    NO GROWTH 5 DAYS Performed at [facility] [address] [place], [place] [address]    REPTSTATUS 12/24/2021 FINAL 12/19/2021 4132    Cardiac Enzymes: No results for input(s): CKTOTAL, CKMB, CKMBINDEX, TROPONINI in the last 168 hours. CBG: Recent Labs  Lab 12/26/21 1021  GLUCAP 103*   Iron Studies: No results for input(s): IRON, TIBC, TRANSFERRIN, FERRITIN in the last 72 hours. Lab Results  Component Value Date   INR 1.0 10/16/2019   Studies/Results: No results found.  Medications:  [START ON 12/27/2021] sodium chloride     [START ON 12/27/2021] sodium chloride  sodium chloride     sodium chloride     vancomycin 500 mg (12/24/21 1736)    apixaban   Does not apply Once   [START ON 12/27/2021] apixaban  10 mg Oral BID   Followed by   Derrill Memo ON 01/01/2022] apixaban  5 mg Oral BID   Chlorhexidine Gluconate Cloth  6 each Topical Q0600   darbepoetin (ARANESP) injection - DIALYSIS  40 mcg Intravenous Q Thu-HD   hydrALAZINE  25 mg Oral Q8H   lidocaine-EPINEPHrine  10 mL Intradermal Once   metoprolol tartrate  37.5 mg Oral BID   polyethylene glycol  34 g Oral Daily   senna-docusate  2 tablet Oral BID   sevelamer carbonate  1,600 mg Oral TID WC    Dialysis Orders: TTS HD at [facility] 4h  400/600  68.5kg  3/2 bath  Hep 2000   - hect 4 ug tiw -  venofer 50 q wk - no esa, last Hb 11.9  Assessment/Plan: Sepsis/ MRSA bacteremia - 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 As well concern for vertebral osteo.  ID recommended IV vanc and line holiday. Repeat blood Cx 1/14 neg to date. TTE showed MV vegetation, recommended TEE.   TEE - neg for MV vegetation but R atrial mass seen. Felt to be thrombus related to HD cath and will need 6 months of [facility] TDC removed by VVS on 1/14 for line holiday. New TDC + AVF placed 1/18.  Vancomycin with HD for 8 weeks (end date 02/13/22)  ESRD -HD TTS. Had HD 1/18 after TDC placed. Plan for HD today at bedside (on amio drip) Afib/A-flutter-Cardiology following; Eliquis on hold. Continue Amiodarone drip. Access - New R IJ TDC + L BB 1st stage AVF placed 1/18 Dr. Stanford Breed. TDC bleed overnight. VVS following. Eliquis on hold for now. HTN/volume -BP acceptable.  Last post weight not recorded.  Anemia -Hgb 10.3. Continue Aranesp.  Secondary hyperparathyroidism -Corrected Ca elevated. Phos is trending down but still high. Continue Renvela. Hold vitamin D with ^ Ca ,used 2.0 calcium bath.  Right-sided pulmonary nodule.  follow-up intervention per primary/pulm Lower back pain.  Possible osteo-/discitis on MRI LS. Pain management per primary   Diamond Poet, NP [facility] 12/26/2021,2:25 PM  LOS: 9 days

## 2021-12-26 NOTE — Progress Notes (Signed)
Progress Note  Patient Name: Diamond [name] Date of Encounter: 12/26/2021  [facility] Cardiologist: None   Subjective   Bleeding from R IJ catheter site. RN holding pressure.   Inpatient Medications    Scheduled Meds:  apixaban   Does not apply Once   apixaban  10 mg Oral BID   Followed by   Derrill Memo ON 01/01/2022] apixaban  5 mg Oral BID   Chlorhexidine Gluconate Cloth  6 each Topical Q0600   darbepoetin (ARANESP) injection - DIALYSIS  40 mcg Intravenous Q Thu-HD   hydrALAZINE  25 mg Oral Q8H   metoprolol tartrate  25 mg Oral BID   polyethylene glycol  34 g Oral Daily   senna-docusate  2 tablet Oral BID   sevelamer carbonate  1,600 mg Oral TID WC   Continuous Infusions:  sodium chloride     sodium chloride     amiodarone 30 mg/hr (12/26/21 0207)   vancomycin 500 mg (12/24/21 1736)   PRN Meds: sodium chloride, sodium chloride, acetaminophen **OR** acetaminophen, albuterol, alteplase, heparin, hydrALAZINE, lidocaine (PF), lidocaine-prilocaine, naLOXone (NARCAN)  injection, oxyCODONE, pentafluoroprop-tetrafluoroeth, polyethylene glycol   Vital Signs    Vitals:   12/26/21 0600 12/26/21 0720 12/26/21 0725 12/26/21 1157  BP:  (!) 145/94 (!) 144/91 101/65  Pulse:   85 99  Resp:  15 18 20   Temp:   98.2 F (36.8 C) 97.9 F (36.6 C)  TempSrc:   Oral Oral  SpO2:   98%   Weight: 62.8 kg     Height:        Intake/Output Summary (Last 24 hours) at 12/26/2021 1245 Last data filed at 12/26/2021 0640 Gross per 24 hour  Intake 245.42 ml  Output 35 ml  Net 210.42 ml   Last 3 Weights 12/26/2021 12/25/2021 12/24/2021  Weight (lbs) 138 lb 7.2 oz 134 lb 11.2 oz 136 lb 3.9 oz  Weight (kg) 62.8 kg 61.1 kg 61.8 kg         ECG    Personally Reviewed  Physical Exam   GEN: No acute distress in bed at 45 degrees Neck: No JVD Cardiac: tachycardic, regular rhythm, warm extremities. Bleeding from the R subclavian vein access site  Respiratory: Clear to auscultation  bilaterally. GI: Soft, nontender, non-distended  MS: No edema; No deformity. Neuro:  Nonfocal  Psych: Normal affect   Labs    High Sensitivity Troponin:   Recent Labs  Lab 12/24/21 0039 12/24/21 0508  TROPONINIHS 24* 28*     Chemistry Recent Labs  Lab 12/21/21 0645 12/23/21 0126 12/24/21 0508 12/26/21 0059  NA 128* 128* 130* 126*  K 3.7 4.2 4.3 3.6  CL 93* 93* 94* 88*  CO2 23 21* 18* 25  GLUCOSE 92 92 91 106*  BUN 55* 74* 81* 43*  CREATININE 6.58* 8.04* 8.86* 5.93*  CALCIUM 9.2 9.4 9.3 10.0  ALBUMIN 2.0*  --  2.1* 2.2*  GFRNONAA 6* 5* 4* 7*  ANIONGAP 12 14 18* 13    Lipids No results for input(s): CHOL, TRIG, HDL, LABVLDL, LDLCALC, CHOLHDL in the last 168 hours.  Hematology Recent Labs  Lab 12/24/21 0508 12/26/21 0059 12/26/21 1036  WBC 7.5 9.6 12.2*  RBC 3.13* 3.41* 3.22*  HGB 9.8* 10.4* 10.3*  HCT 29.4* 31.4* 30.0*  MCV 93.9 92.1 93.2  MCH 31.3 30.5 32.0  MCHC 33.3 33.1 34.3  RDW 17.2* 17.2* 17.4*  PLT 193 154 172   Thyroid No results for input(s): TSH, FREET4 in the last 168  hours.  BNPNo results for input(s): BNP, PROBNP in the last 168 hours.  DDimer No results for input(s): DDIMER in the last 168 hours.   Radiology    No results found.  Cardiac Studies     Patient Profile     71 y.o. female with ESRD on iHD, asthma, HTN, anemia of chronic disease who was seen 12/25/2021 for new diagnosis of atrial flutter.   Assessment & Plan    #Bleeding R IJ access site I sent a message to the vascular surgery team([name], PA-C) to ask them to evaluate patient given RN has been holding pressure - hold apixaban for now  #Atrial flutter Holding apixaban while R IJ site is bleeding - asymptomatic - will stop amiodarone given patient is asymptomatic, hemodynamically stable and given patient cannot reliably be on anticoagulant right now with bleeding from access sites. I recommend focusing on rate control. Can revisit cardioversion (electrical or  pharmacologic) once her bleeding is stopped and she is reliably on anticoagulation. - recommend uptitration of her metoprolol as her BP/HR allows (I increased to 37.5mg  PO BID this AM)     For questions or updates, please contact [place] Please consult [email or web address] for contact info under        Signed, Vickie Epley, MD  12/26/2021, 12:45 PM

## 2021-12-26 NOTE — Progress Notes (Signed)
Pharmacy Antibiotic Note  Diamond [name] is a 71 y.o. female admitted on 12/16/2021 with 3 day hx of R-sided back pain and found to have MRSA bacteremia. Pt with ESRD on HD TTS schedule with last HD session 12/24/21. Pharmacy has been consulted for Vancomycin dosing.  Patient tolerated full HD session Thursday 1/19. Of note, patient has a line holiday from 1/14 to 1/17.  Plan: Continue Vancomycin 500 mg IV qHD (TThS) Follow-up cx data and clinical progress. Level as need. Goal pre-HD level 15-25 mcg/ml.  Height: 5\' 7"  (170.2 cm) Weight: 62.8 kg (138 lb 7.2 oz) IBW/kg (Calculated) : 61.6  Temp (24hrs), Avg:98.1 F (36.7 C), Min:96.6 F (35.9 C), Max:99.3 F (37.4 C)  Recent Labs  Lab 12/21/21 0645 12/22/21 0124 12/23/21 0126 12/24/21 0508 12/26/21 0059 12/26/21 1036  WBC  --   --  6.9 7.5 9.6 12.2*  CREATININE 6.58*  --  8.04* 8.86* 5.93*  --   VANCORANDOM  --  26  --   --   --   --      Estimated Creatinine Clearance: 8.3 mL/min (A) (by C-G formula based on SCr of 5.93 mg/dL (H)).    No Known Allergies  Antimicrobials this admission: Ceftriaxone 1/12>> Metronid 1/12>> Vanc 1/12 >> Cefepime x1  Microbiology results: 1/14 Bcx: NGTD 1/12 BCx x 2: MRSA 1/11 UCx: negative 1/11 COVID / Flu negative   Thank you for allowing pharmacy to participate in this patient's care.  Marzella Schlein [place], Student Pharmacist    Please check [email or web address] for unit-specific pharmacy phone numbers

## 2021-12-26 NOTE — Progress Notes (Signed)
Vascular surgery:  Called to see patient because of bleeding around tunneled dialysis catheter.  This catheter was placed on 12/23/2021.  Under sterile conditions, and local anesthesia, I placed a suture to stop the bleeding.  I also applied some Dermabond.  Gae Gallop, MD 2:03 PM

## 2021-12-26 NOTE — Significant Event (Signed)
This RN notified by NT of bleeding at HD site. This RN had been in room approximately 30 minutes prior and dressing was CDI. RN entered room now and noted blood slowly oozing at site. RN placed consult to IV team and returned to room and site bleeding more and blood covering BP cuff on right arm and blood on bed. RN began holding pressure at HD cath site with gauze and called CN via radio.  CN paged MD and per CN then paged IR per day team request who will not be here until 0800. Site still bleeding after 30 minutes of pressure. RN then called rapid RN who came to bedside to assist with holding pressure until day team IR arrived. Report given to day shift RN at bedside.

## 2021-12-26 NOTE — Progress Notes (Signed)
IV Team received 2 more calls/consults at shift change this morning, as the "[facility] continues to bleed."   The pt was seen at 3am by 2 IV Team RNs who changed the dressing; per RN, the site was ok for awhile, but began bleeding again.  Rapid Response and 2 IV Team RNs are at the bedside again at present.  Pressure being held to the site; Diplomatic Services operational officer has notified IR, Hemodialysis staff, and primary MD.

## 2021-12-26 NOTE — Procedures (Signed)
Order received to place Diamond [name] on Hemodialysis. After receiving verbal report from [place] I arrived to room and began assessing pt.  Pt appeared to be weak and slightly obtunded, however was able to be alert upon stimulation.  Following her assessment, I placed her on Hemodialysis as per MD order set. Within 15 minutes the pt blood pressure dropped to 64/55.  I immediately turned off the UF, readjusted the cuff and recycled her blood pressure again it read low at 70/49. Pt was immediately placed in Trendelenburg.   Anderson NP was notified of her situation and that the pt assessment had changed.  A rapid response was called and [employer] arrived to bedside within <10 min.  Ghimire MD at bedside and orders received for numerous normal saline boluses to be given as pt was unresponsive.  Anderson NP arrived to bedside and BP was stablized.  Order received to remove Diamond. Dysert from hemodialysis if BP were to once again be below 563 systolic.  As such pt was removed from tx early and I stayed with her until she was stablized.  Upon leaving Diamond.Traweek's room she was once again stable, however she continues to be oriented to only her name.  Report was given to the Charge RN as she was covering for the bedside RN.

## 2021-12-26 NOTE — Assessment & Plan Note (Addendum)
Developed RVR on 1/20-initially started on Cardizem infusion-but then transition to amiodarone infusion by cardiology.  Maintaining NSR-no longer on amiodarone-has been transitioned to metoprolol.  As noted above-plans are to transition from IV heparin to Eliquis on discharge.

## 2021-12-27 DIAGNOSIS — R7881 Bacteremia: Secondary | ICD-10-CM | POA: Diagnosis not present

## 2021-12-27 DIAGNOSIS — A419 Sepsis, unspecified organism: Secondary | ICD-10-CM | POA: Diagnosis not present

## 2021-12-27 DIAGNOSIS — D631 Anemia in chronic kidney disease: Secondary | ICD-10-CM

## 2021-12-27 DIAGNOSIS — I959 Hypotension, unspecified: Secondary | ICD-10-CM

## 2021-12-27 DIAGNOSIS — M4620 Osteomyelitis of vertebra, site unspecified: Secondary | ICD-10-CM | POA: Diagnosis not present

## 2021-12-27 DIAGNOSIS — D62 Acute posthemorrhagic anemia: Secondary | ICD-10-CM

## 2021-12-27 DIAGNOSIS — N186 End stage renal disease: Secondary | ICD-10-CM | POA: Diagnosis not present

## 2021-12-27 LAB — BPAM RBC
Blood Product Expiration Date: 202302142359
ISSUE DATE / TIME: 202301211721
Unit Type and Rh: 5100

## 2021-12-27 LAB — CBC
HCT: 23.7 % — ABNORMAL LOW (ref 36.0–46.0)
Hemoglobin: 8.4 g/dL — ABNORMAL LOW (ref 12.0–15.0)
MCH: 31.6 pg (ref 26.0–34.0)
MCHC: 35.4 g/dL (ref 30.0–36.0)
MCV: 89.1 fL (ref 80.0–100.0)
Platelets: 150 10*3/uL (ref 150–400)
RBC: 2.66 MIL/uL — ABNORMAL LOW (ref 3.87–5.11)
RDW: 17.9 % — ABNORMAL HIGH (ref 11.5–15.5)
WBC: 10.8 10*3/uL — ABNORMAL HIGH (ref 4.0–10.5)
nRBC: 0 % (ref 0.0–0.2)

## 2021-12-27 LAB — HEPARIN LEVEL (UNFRACTIONATED): Heparin Unfractionated: >1.1 IU/mL — ABNORMAL HIGH (ref 0.30–0.70)

## 2021-12-27 LAB — RENAL FUNCTION PANEL
Albumin: 1.9 g/dL — ABNORMAL LOW (ref 3.5–5.0)
Anion gap: 13 (ref 5–15)
BUN: 35 mg/dL — ABNORMAL HIGH (ref 8–23)
CO2: 24 mmol/L (ref 22–32)
Calcium: 8.8 mg/dL — ABNORMAL LOW (ref 8.9–10.3)
Chloride: 91 mmol/L — ABNORMAL LOW (ref 98–111)
Creatinine, Ser: 4.92 mg/dL — ABNORMAL HIGH (ref 0.44–1.00)
GFR, Estimated: 9 mL/min — ABNORMAL LOW (ref >60)
Glucose, Bld: 92 mg/dL (ref 70–99)
Phosphorus: 6.7 mg/dL — ABNORMAL HIGH (ref 2.5–4.6)
Potassium: 4 mmol/L (ref 3.5–5.1)
Sodium: 128 mmol/L — ABNORMAL LOW (ref 135–145)

## 2021-12-27 LAB — TYPE AND SCREEN
ABO/RH(D): O POS
Antibody Screen: NEGATIVE
Unit division: 0

## 2021-12-27 LAB — APTT
aPTT: 60 seconds — ABNORMAL HIGH (ref 24–36)
aPTT: 55 seconds — ABNORMAL HIGH (ref 24–36)

## 2021-12-27 MED ORDER — SODIUM CHLORIDE 0.9 % IV SOLN: 100.0000 mL | INTRAVENOUS | Status: DC | PRN

## 2021-12-27 MED ORDER — HEPARIN (PORCINE) 25000 UT/250ML-% IV SOLN
1250.0000 [IU]/h | INTRAVENOUS | Status: DC
  Administered 2021-12-27: 1050 [IU]/h via INTRAVENOUS
  Administered 2021-12-29: 22:00:00 1250 [IU]/h via INTRAVENOUS
  Filled 2021-12-27 (×3): qty 250

## 2021-12-27 MED ORDER — DARBEPOETIN ALFA 60 MCG/0.3ML IJ SOSY: 60.0000 ug | PREFILLED_SYRINGE | INTRAMUSCULAR | Status: DC

## 2021-12-27 NOTE — Progress Notes (Signed)
[facility] Progress Note   Subjective:    Seen and examined patient at bedside. Sitting up in bed eating breakfast. Looking much better. Currently with no complaints. Spoke to bedside RN-no bleeding from [facility] overnight. S/p 1 unit PRBCs 12/26/21 for Hgb 7.0. Hgb improved-now 8.4. Unable to dialyze yesterday so plan for HD tomorrow 12/28/21.  Objective Vitals:   12/27/21 0333 12/27/21 0630 12/27/21 0812 12/27/21 1214  BP: 110/64 (!) 146/89 (!) 158/92 (!) 144/89  Pulse: 85  93 92  Resp: 17 20 18 20   Temp: 98.2 F (36.8 C)  98.1 F (36.7 C)   TempSrc: Oral  Axillary   SpO2: 97%     Weight: 64.2 kg     Height:       Physical Exam General: Lying in bed; NAD Heart: Normal S1 and S2; No murmurs, gallops, or rubs Lungs: Clear throughout; No wheezing, rales, or rhonchi Abdomen: Soft and non-tender Extremities: No edema BLLE Dialysis Access: R IJ TDC (new)-no bleeding noted; dressing clean, dry, and intact; L AVF (+) Bruit/Thrill (new)   Filed Weights   12/26/21 1415 12/26/21 1600 12/27/21 0333  Weight: 61.4 kg 65.1 kg 64.2 kg    Intake/Output Summary (Last 24 hours) at 12/27/2021 1440 Last data filed at 12/27/2021 0900 Gross per 24 hour  Intake 1329.32 ml  Output -1940 ml  Net 3269.32 ml    Additional Objective Labs: Basic Metabolic Panel: Recent Labs  Lab 12/24/21 0508 12/26/21 0059 12/26/21 1530 12/27/21 0125  NA 130* 126* 130* 128*  K 4.3 3.6 3.6 4.0  CL 94* 88* 93* 91*  CO2 18* 25 26 24   GLUCOSE 91 106* 101* 92  BUN 81* 43* 26* 35*  CREATININE 8.86* 5.93* 3.83* 4.92*  CALCIUM 9.3 10.0 8.1* 8.8*  PHOS 10.2* 7.3*  --  6.7*   Liver Function Tests: Recent Labs  Lab 12/26/21 0059 12/26/21 1530 12/27/21 0125  AST  --  15  --   ALT  --  8  --   ALKPHOS  --  49  --   BILITOT  --  0.4  --   PROT  --  5.0*  --   ALBUMIN 2.2* 1.6* 1.9*   No results for input(s): LIPASE, AMYLASE in the last 168 hours. CBC: Recent Labs  Lab 12/24/21 0508  12/26/21 0059 12/26/21 1036 12/26/21 1530 12/27/21 0125  WBC 7.5 9.6 12.2* 15.3* 10.8*  HGB 9.8* 10.4* 10.3* 7.0* 8.4*  HCT 29.4* 31.4* 30.0* 20.1* 23.7*  MCV 93.9 92.1 93.2 91.8 89.1  PLT 193 154 172 138* 150   Blood Culture    Component Value Date/Time   SDES BLOOD RIGHT HAND 12/19/2021 0643   SPECREQUEST  12/19/2021 8453    BOTTLES DRAWN AEROBIC ONLY Blood Culture results may not be optimal due to an inadequate volume of blood received in culture bottles   CULT  12/19/2021 0643    NO GROWTH 5 DAYS Performed at [facility] [address], [place], [place] [address]    REPTSTATUS 12/24/2021 FINAL 12/19/2021 3212    Cardiac Enzymes: No results for input(s): CKTOTAL, CKMB, CKMBINDEX, TROPONINI in the last 168 hours. CBG: Recent Labs  Lab 12/26/21 1021 12/26/21 1512  GLUCAP 103* 129*   Iron Studies: No results for input(s): IRON, TIBC, TRANSFERRIN, FERRITIN in the last 72 hours. Lab Results  Component Value Date   INR 1.0 10/16/2019   Studies/Results: DG CHEST PORT 1 VIEW  Result Date: 12/26/2021 CLINICAL DATA:  Hypotension.  EXAM: PORTABLE CHEST 1 VIEW COMPARISON:  12/23/2021 FINDINGS: 1525 hours. The lungs are clear without focal pneumonia, edema, pneumothorax or pleural effusion. The cardiopericardial silhouette is within normal limits for size. Right IJ central line tip overlies the upper right atrium. Bones are diffusely demineralized. IMPRESSION: 1. No acute cardiopulmonary findings. 2. Right IJ central line tip overlies the upper right atrium. Electronically Signed   By: Misty Stanley M.D.   On: 12/26/2021 15:30    Medications:  heparin 1,050 Units/hr (12/27/21 0835)   vancomycin 500 mg (12/24/21 1736)    apixaban   Does not apply Once   Chlorhexidine Gluconate Cloth  6 each Topical Q0600   darbepoetin (ARANESP) injection - DIALYSIS  40 mcg Intravenous Q Thu-HD   [START ON 12/28/2021] heparin sodium (porcine)  3,200 Units Intravenous Q M,W,F-HD    hydrALAZINE  25 mg Oral Q8H   metoprolol tartrate  37.5 mg Oral BID   polyethylene glycol  34 g Oral Daily   senna-docusate  2 tablet Oral BID   sevelamer carbonate  1,600 mg Oral TID WC    Dialysis Orders: TTS HD [facility] 4h  400/600  68.5kg  3/2 bath  Hep 2000   - hect 4 ug tiw - venofer 50 q wk - no esa, last Hb 11.9  Assessment/Plan: Sepsis/ MRSA bacteremia - 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 As well concern for vertebral osteo.  ID recommended IV vanc and line holiday. Repeat blood Cx 1/14 neg to date. TTE showed MV vegetation, recommended TEE.   TEE - neg for MV vegetation but R atrial mass seen. Felt to be thrombus related to HD cath and will need 6 months of [facility] TDC removed by VVS on 1/14 for line holiday. New TDC + AVF placed 1/18.  Vancomycin with HD for 8 weeks (end date 02/13/22)  ESRD -HD TTS. Unable to dialyze yesterday d/t AMS and low Bps. Suspected acute blood loss from previous bleeding from [facility]. Patient doing much better after transfusion. Plan for HD 12/28/21.  Afib/A-flutter-Cardiology following; Eliquis on hold. Amio drip now off-rate currently controlled. Access - New R IJ TDC + L BB 1st stage AVF placed 1/18 Dr. Stanford Breed. VVS following. Eliquis on hold for now d/t previous bleeding from TDC-no bleeding noted overnight-monitor closely HTN/volume -BP improving. Noted low-dose lasix given yesterday d/t having to stop HD yesterday. Doesn't appear volume overloaded on exam today. HD tomorrow. Anemia -Hgb trended down to 7.0-suspect acute blood loss from bleeding TDC. S/p 1 unit PRBCs 12/26/21. Hgb now 8.4. Will raise Aranesp for next scheduled dose 1/26.  Secondary hyperparathyroidism -Corrected Ca elevated. Phos trending down. Continue Renvela. Hold vitamin D with ^ Ca ,used 2.0 calcium bath.  Right-sided pulmonary nodule.  follow-up intervention per primary/pulm Lower back pain.  Possible osteo-/discitis on MRI LS. Pain management per primary   Tobie Poet,  NP [facility] 12/27/2021,2:40 PM  LOS: 10 days

## 2021-12-27 NOTE — Progress Notes (Signed)
ANTICOAGULATION CONSULT NOTE   Pharmacy Consult for Apixaban Indication: RA thrombus  No Known Allergies  Patient Measurements: Height: 5\' 7"  (170.2 cm) Weight: 64.2 kg (141 lb 8.6 oz) IBW/kg (Calculated) : 61.6 Heparin dosing weight: 64.2 kg   Vital Signs: Temp: 99 F (37.2 C) (01/22 2138) Temp Source: Oral (01/22 2138) BP: 172/104 (01/22 2138) Pulse Rate: 95 (01/22 2138)  Labs: Recent Labs    12/26/21 0059 12/26/21 1036 12/26/21 1530 12/27/21 0125 12/27/21 1053 12/27/21 1918  HGB 10.4* 10.3* 7.0* 8.4*  --   --   HCT 31.4* 30.0* 20.1* 23.7*  --   --   PLT 154 172 138* 150  --   --   APTT  --   --   --   --  60* 55*  HEPARINUNFRC  --   --   --   --   --  >1.10*  CREATININE 5.93*  --  3.83* 4.92*  --   --      Estimated Creatinine Clearance: 10.1 mL/min (A) (by C-G formula based on SCr of 4.92 mg/dL (H)).   Medical History: Past Medical History:  Diagnosis Date   Asthma    Atrial flutter with rapid ventricular response ([place]) 12/25/2021   Chronic kidney disease    dialysis Tues Thurs Sat   Dysrhythmia    PAF 10/2019 in setting of COVID-19 PNA   Gout    Hypertension      [name]  with history of ESRD on HD TTS-presenting on 12/16/21  with sepsis due to MRSA bacteremia. Currently on Vancomycin on HD days,  End date for Vanc is 02/13/22.  A right atrial/IVC globular mobile mass compatible with thrombotic material/vegetative material seen on TEE on 1/17. Dr. Sloan Leiter  discussed this with cardiologist-Dr. Marlou Porch who -probably a thrombus related to HD catheter-recommends at least 6 months of anticoagulation.   Pharmacy consulted to start Apixaban for right atrial thrombus.  She was not on anticoagulation prior to admission.  Copay $45 for 30 day supply  Patient has been bleeding from catheter site after starting apixaban and vascular consulted. Unable to stop bleeding with pressure and rapid response called. Dr. Scot Dock placed a suture to stop the  bleeding and applied Dermabond.Marland Kitchen Apixaban  switched to heparin since it has a quicker on/off time. Because of RA thrombus and risk of developing a PE, still need full dose anticoagulation. -aPTT= 55 on 1050 units/jr  Goal of Therapy:  Monitor platelets by anticoagulation protocol: Yes   Plan:  -Increase heparin to 1200 units/hr -aPTT in 8 hours  Hildred Laser, PharmD Clinical Pharmacist **Pharmacist phone directory can now be found on [email or web address] ([email or web address]).  Listed under [place].

## 2021-12-27 NOTE — Assessment & Plan Note (Signed)
Occurred on 1/21-due to blood loss from bleeding HD catheter site.  Resolved with PRBC transfusion and supportive care.  BP now stable.

## 2021-12-27 NOTE — Progress Notes (Addendum)
PROGRESS NOTE        PATIENT DETAILS Name: Diamond [name] Age: 71 y.o. Sex: female Date of Birth: 08-21-1951 Admit Date: 12/16/2021 Admitting Physician Rhetta Mura, DO INO:[id number], Iona Beard, MD  Brief Narrative: 71 year old female with history of ESRD on HD TTS-presenting with sepsis due to MRSA bacteremia.  Hospital course complicated by A. fib RVR, bleeding from HD catheter site on 1/21 causing hypotension and acute blood loss anemia.  See below for further details   Subjective: Awake/alert-no further bleeding from HD catheter site.  Appears much more comfortable/better than yesterday.  Denies any chest pain or shortness of breath.  Objective: Vitals: Blood pressure (!) 158/92, pulse 93, temperature 98.1 F (36.7 C), temperature source Axillary, resp. rate 18, height 5\' 7"  (1.702 m), weight 64.2 kg, SpO2 97 %.   Exam: Gen Exam:Alert awake-not in any distress HEENT:atraumatic, normocephalic Chest: B/L clear to auscultation anteriorly CVS:S1S2 regular Abdomen:soft non tender, non distended Extremities:no edema Neurology: Non focal Skin: no rash   Pertinent Labs/Radiology: CBC Latest Ref Rng & Units 12/27/2021 12/26/2021 12/26/2021  WBC 4.0 - 10.5 K/uL 10.8(H) 15.3(H) 12.2(H)  Hemoglobin 12.0 - 15.0 g/dL 8.4(L) 7.0(L) 10.3(L)  Hematocrit 36.0 - 46.0 % 23.7(L) 20.1(L) 30.0(L)  Platelets 150 - 400 K/uL 150 138(L) 172    Lab Results  Component Value Date   NA 128 (L) 12/27/2021   K 4.0 12/27/2021   CL 91 (L) 12/27/2021   CO2 24 12/27/2021      Assessment/Plan: * Severe sepsis ([place]) due to MRSA bacteremia, L5-S1 discitis and endocarditis- (present on admission) Sepsis physiology has resolved-repeat blood cultures negative so far-  TEE on 1/17 without obvious valvular vegetations-but did have a right atrial/IVC globular mobile mass compatible with thrombotic/vegetative material.  Reasonable to treat like endocarditis with embolic phenomena  causing discitis.  ID recommending IV vancomycin-end date-02/13/2022.   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 seen on TEE on 1/17 Discussed with cardiologist-Dr. Marlou Porch on 1/18-probably a thrombus related to HD catheter-recommends at least 6 months of anticoagulation-however now with atrial flutter-will require indefinite anticoagulation.    After discussion with Dr. Hayden Rasmussen surgery on 1/19-full dose anticoagulation was started.  Unfortunately developed bleeding from HD catheter site on 1/21-anticoagulation was held-discussed earlier today with Dr. Vidal Schwalbe to resume anticoagulation-we will place on IV heparin-and if no further bleeding episodes-we will plan to transition to Eliquis prior to discharge.  Atrial flutter with rapid ventricular response-CHA2DS2-VASc 4 Developed RVR on 1/20-initially started on Cardizem infusion-but then transition to amiodarone infusion by cardiology.  Per cardiology note on 1/21-no need for amiodarone-now stable on beta-blocker.  Anticoagulation held on 1/21 due to bleeding from HD catheter site-being resumed on 1/22.  Will await further recommendations from cardiology.  Maintaining NSR today.    Hypotension Occurred on 1/21-due to blood loss from bleeding HD catheter site.  Resolved with PRBC transfusion and supportive care.  BP now stable.  Bleeding from dialysis catheter site Occurred around 6:45 AM on 1/21-pressure held-eventually seen by vascular surgery-and sutured at bedside.  No further bleeding since then.   Multifactorial anemia-acute blood loss from bleeding HD catheter site-superimposed on anemia related to ESRD.- (present on admission) Due to worsening hemoglobin from HD catheter site bleeding-transfused 1 unit of PRBC on 01/21.  Hemoglobin now stable.  Nephrology following-we will defer Aranesp/IV iron to them.  ESRD (end stage renal disease) (  [place])- (present on admission) Due to bacteremia given line holiday-dialysis  catheter was removed on 1/14, on 1/18-vascular surgery placed right IJ TDC and left brachial AV fistula.  Nephrology following-defer HD care to nephrology.  Hyponatremia- (present on admission) Volume management with HD-follow.  Hypertension- (present on admission) BP stable-no longer on verapamil and hydralazine-as patient not on beta-blocker.  Follow and adjust.    Mild intermittent asthma- (present on admission)  Stable-continue as needed bronchodilators.  Lung nodule seen on imaging study- (present on admission)  will need repeat CT chest in 25-month  BMI: Estimated body mass index is 22.17 kg/m as calculated from the following:   Height as of this encounter: 5\' 7"  (1.702 m).   Weight as of this encounter: 64.2 kg.   Procedures:  1/14>> HD catheter removed 1/17>> TEE 1/18>> right IJ tunnel catheter placement, left brachiocephalic for stage AV fistula creation   DVT Prophylaxis: Eliquis. Consults: Nephrology, vascular surgery, infectious disease, cardiology Code Status:Full code  Family Communication:Son Marchia [phone] voicemail on 1/22.   Disposition Plan: Status is: Inpatient  Remains inpatient appropriate because: MRSA bacteremia-on IV vancomycin-recommendations are for SNF on discharge    Diet: Diet Order             Diet renal with fluid restriction Fluid restriction: 2000 mL Fluid; Room service appropriate? Yes; Fluid consistency: Thin  Diet effective now                     Antimicrobial agents: Anti-infectives (From admission, onward)    Start     Dose/Rate Route Frequency Ordered Stop   12/26/21 1200  vancomycin (VANCOREADY) IVPB 500 mg/100 mL  Status:  Discontinued        500 mg 100 mL/hr over 60 Minutes Intravenous Every T-Th-Sa (Hemodialysis) 12/24/21 1134 12/24/21 1158   12/24/21 1245  vancomycin (VANCOREADY) IVPB 500 mg/100 mL        500 mg 100 mL/hr over 60 Minutes Intravenous Every T-Th-Sa (Hemodialysis) 12/24/21 1158      12/24/21 1230  vancomycin (VANCOREADY) IVPB 500 mg/100 mL  Status:  Discontinued        500 mg 100 mL/hr over 60 Minutes Intravenous  Once 12/24/21 1134 12/24/21 1147   12/23/21 1456  ceFAZolin (ANCEF) 2-4 GM/100ML-% IVPB  Status:  Discontinued       Note to Pharmacy: Gregery Na H: cabinet override      12/23/21 1456 12/23/21 1515   12/23/21 1200  vancomycin (VANCOREADY) IVPB 500 mg/100 mL  Status:  Discontinued        500 mg 100 mL/hr over 60 Minutes Intravenous  Once 12/22/21 1555 12/23/21 2216   12/22/21 1200  vancomycin (VANCOREADY) IVPB 500 mg/100 mL  Status:  Discontinued        500 mg 100 mL/hr over 60 Minutes Intravenous Every T-Th-Sa (Hemodialysis) 12/22/21 0842 12/24/21 1134   12/18/21 0945  vancomycin (VANCOREADY) IVPB 750 mg/150 mL        750 mg 150 mL/hr over 60 Minutes Intravenous  Once 12/18/21 0845 12/18/21 1138   12/17/21 2200  ceFEPIme (MAXIPIME) 1 g in sodium chloride 0.9 % 100 mL IVPB  Status:  Discontinued        1 g 200 mL/hr over 30 Minutes Intravenous Every 24 hours 12/17/21 0129 12/17/21 0326   12/17/21 1800  cefTRIAXone (ROCEPHIN) 1 g in sodium chloride 0.9 % 100 mL IVPB  Status:  Discontinued        1 g 200 mL/hr over  30 Minutes Intravenous Every 24 hours 12/17/21 0326 12/17/21 1631   12/17/21 1445  vancomycin (VANCOREADY) IVPB 750 mg/150 mL  Status:  Discontinued        750 mg 150 mL/hr over 60 Minutes Intravenous Every T-Th-Sa (Hemodialysis) 12/17/21 1432 12/22/21 0842   12/17/21 1400  metroNIDAZOLE (FLAGYL) tablet 500 mg  Status:  Discontinued        500 mg Oral Every 8 hours 12/17/21 1345 12/17/21 1631   12/17/21 1200  vancomycin (VANCOREADY) IVPB 750 mg/150 mL  Status:  Discontinued        750 mg 150 mL/hr over 60 Minutes Intravenous Every T-Th-Sa (Hemodialysis) 12/17/21 0129 12/17/21 0326   12/17/21 0130  vancomycin (VANCOREADY) IVPB 1500 mg/300 mL        1,500 mg 150 mL/hr over 120 Minutes Intravenous  Once 12/17/21 0115 12/17/21 0537   12/17/21  0115  ceFEPIme (MAXIPIME) 2 g in sodium chloride 0.9 % 100 mL IVPB        2 g 200 mL/hr over 30 Minutes Intravenous  Once 12/17/21 0113 12/17/21 0315   12/17/21 0115  metroNIDAZOLE (FLAGYL) IVPB 500 mg        500 mg 100 mL/hr over 60 Minutes Intravenous  Once 12/17/21 0113 12/17/21 0526   12/17/21 0115  vancomycin (VANCOCIN) IVPB 1000 mg/200 mL premix  Status:  Discontinued        1,000 mg 200 mL/hr over 60 Minutes Intravenous  Once 12/17/21 0113 12/17/21 0115        MEDICATIONS: Scheduled Meds:  apixaban   Does not apply Once   Chlorhexidine Gluconate Cloth  6 each Topical Q0600   darbepoetin (ARANESP) injection - DIALYSIS  40 mcg Intravenous Q Thu-HD   [START ON 12/28/2021] heparin sodium (porcine)  3,200 Units Intravenous Q M,W,F-HD   hydrALAZINE  25 mg Oral Q8H   metoprolol tartrate  37.5 mg Oral BID   polyethylene glycol  34 g Oral Daily   senna-docusate  2 tablet Oral BID   sevelamer carbonate  1,600 mg Oral TID WC   Continuous Infusions:  heparin 1,050 Units/hr (12/27/21 0835)   vancomycin 500 mg (12/24/21 1736)   PRN Meds:.acetaminophen **OR** acetaminophen, albuterol, alteplase, heparin, hydrALAZINE, lidocaine (PF), lidocaine-prilocaine, naLOXone (NARCAN)  injection, oxyCODONE, pentafluoroprop-tetrafluoroeth, polyethylene glycol   I have personally reviewed following labs and imaging studies  LABORATORY DATA: CBC: Recent Labs  Lab 12/24/21 0508 12/26/21 0059 12/26/21 1036 12/26/21 1530 12/27/21 0125  WBC 7.5 9.6 12.2* 15.3* 10.8*  HGB 9.8* 10.4* 10.3* 7.0* 8.4*  HCT 29.4* 31.4* 30.0* 20.1* 23.7*  MCV 93.9 92.1 93.2 91.8 89.1  PLT 193 154 172 138* 109    Basic Metabolic Panel: Recent Labs  Lab 12/21/21 0645 12/23/21 0126 12/24/21 0508 12/26/21 0059 12/26/21 1530 12/27/21 0125  NA 128* 128* 130* 126* 130* 128*  K 3.7 4.2 4.3 3.6 3.6 4.0  CL 93* 93* 94* 88* 93* 91*  CO2 23 21* 18* 25 26 24   GLUCOSE 92 92 91 106* 101* 92  BUN 55* 74* 81* 43* 26*  35*  CREATININE 6.58* 8.04* 8.86* 5.93* 3.83* 4.92*  CALCIUM 9.2 9.4 9.3 10.0 8.1* 8.8*  PHOS 7.1*  --  10.2* 7.3*  --  6.7*    GFR: Estimated Creatinine Clearance: 10.1 mL/min (A) (by C-G formula based on SCr of 4.92 mg/dL (H)).  Liver Function Tests: Recent Labs  Lab 12/21/21 0645 12/24/21 0508 12/26/21 0059 12/26/21 1530 12/27/21 0125  AST  --   --   --  15  --   ALT  --   --   --  8  --   ALKPHOS  --   --   --  49  --   BILITOT  --   --   --  0.4  --   PROT  --   --   --  5.0*  --   ALBUMIN 2.0* 2.1* 2.2* 1.6* 1.9*   No results for input(s): LIPASE, AMYLASE in the last 168 hours. No results for input(s): AMMONIA in the last 168 hours.  Coagulation Profile: No results for input(s): INR, PROTIME in the last 168 hours.  Cardiac Enzymes: No results for input(s): CKTOTAL, CKMB, CKMBINDEX, TROPONINI in the last 168 hours.  BNP (last 3 results) No results for input(s): PROBNP in the last 8760 hours.  Lipid Profile: No results for input(s): CHOL, HDL, LDLCALC, TRIG, CHOLHDL, LDLDIRECT in the last 72 hours.  Thyroid Function Tests: No results for input(s): TSH, T4TOTAL, FREET4, T3FREE, THYROIDAB in the last 72 hours.  Anemia Panel: No results for input(s): VITAMINB12, FOLATE, FERRITIN, TIBC, IRON, RETICCTPCT in the last 72 hours.  Urine analysis:    Component Value Date/Time   COLORURINE STRAW (A) 10/01/2021 1234   APPEARANCEUR CLEAR 10/01/2021 1234   LABSPEC 1.009 10/01/2021 1234   PHURINE 7.0 10/01/2021 1234   GLUCOSEU 50 (A) 10/01/2021 1234   HGBUR NEGATIVE 10/01/2021 1234   BILIRUBINUR NEGATIVE 10/01/2021 1234   KETONESUR NEGATIVE 10/01/2021 1234   PROTEINUR 100 (A) 10/01/2021 1234   UROBILINOGEN 0.2 09/27/2021 1329   NITRITE NEGATIVE 10/01/2021 1234   LEUKOCYTESUR TRACE (A) 10/01/2021 1234    Sepsis Labs: Lactic Acid, Venous    Component Value Date/Time   LATICACIDVEN 1.6 12/26/2021 1530    MICROBIOLOGY: Recent Results (from the past 240 hour(s))   MRSA Next Gen by PCR, Nasal     Status: Abnormal   Collection Time: 12/19/21 12:40 AM   Specimen: Nasal Mucosa; Nasal Swab  Result Value Ref Range Status   MRSA by PCR Next Gen DETECTED (A) NOT DETECTED Final    Comment: RESULT CALLED TO, READ BACK BY AND VERIFIED WITH: RN M.ENDISON 12/19/21@5 :45 BY TW (NOTE) The GeneXpert MRSA Assay (FDA approved for NASAL specimens only), is one component of a comprehensive MRSA colonization surveillance program. It is not intended to diagnose MRSA infection nor to guide or monitor treatment for MRSA infections. Test performance is not FDA approved in patients less than 78 years old. Performed at [facility], [place] [address] [place], [place] [address]   Culture, blood (routine x 2)     Status: None   Collection Time: 12/19/21  6:32 AM   Specimen: BLOOD  Result Value Ref Range Status   Specimen Description BLOOD LEFT ANTECUBITAL  Final   Special Requests   Final    BOTTLES DRAWN AEROBIC ONLY Blood Culture results may not be optimal due to an inadequate volume of blood received in culture bottles   Culture   Final    NO GROWTH 5 DAYS Performed at [facility], [place] [address] [place],  [address]    Report Status 12/24/2021 FINAL  Final  Culture, blood (routine x 2)     Status: None   Collection Time: 12/19/21  6:43 AM   Specimen: BLOOD RIGHT HAND  Result Value Ref Range Status   Specimen Description BLOOD RIGHT HAND  Final   Special Requests   Final    BOTTLES DRAWN AEROBIC ONLY Blood  Culture results may not be optimal due to an inadequate volume of blood received in culture bottles   Culture   Final    NO GROWTH 5 DAYS Performed at [facility], [place] [address], [place], [place] [address]    Report Status 12/24/2021 FINAL  Final    RADIOLOGY STUDIES/RESULTS: DG CHEST PORT 1 VIEW  Result Date: 12/26/2021 CLINICAL DATA:  Hypotension. EXAM: PORTABLE CHEST 1 VIEW COMPARISON:  12/23/2021 FINDINGS: 1525 hours. The  lungs are clear without focal pneumonia, edema, pneumothorax or pleural effusion. The cardiopericardial silhouette is within normal limits for size. Right IJ central line tip overlies the upper right atrium. Bones are diffusely demineralized. IMPRESSION: 1. No acute cardiopulmonary findings. 2. Right IJ central line tip overlies the upper right atrium. Electronically Signed   By: Misty Stanley M.D.   On: 12/26/2021 15:30     LOS: 10 days   Oren Binet, MD  [facility]    To contact the attending provider between 7A-7P or the covering provider during after hours 7P-7A, please log into the web site [email or web address] and access using universal [place] password for that web site. If you do not have the password, please call the hospital operator.  12/27/2021, 11:43 AM

## 2021-12-27 NOTE — Progress Notes (Signed)
ANTICOAGULATION CONSULT NOTE - Initial Consult  Pharmacy Consult for Apixaban Indication: RA thrombus  No Known Allergies  Patient Measurements: Height: 5\' 7"  (170.2 cm) Weight: 64.2 kg (141 lb 8.6 oz) IBW/kg (Calculated) : 61.6 Heparin dosing weight: 64.2 kg   Vital Signs: Temp: 98.2 F (36.8 C) (01/22 0333) Temp Source: Oral (01/22 0333) BP: 146/89 (01/22 0630) Pulse Rate: 85 (01/22 0333)  Labs: Recent Labs    12/26/21 0059 12/26/21 1036 12/26/21 1530 12/27/21 0125  HGB 10.4* 10.3* 7.0* 8.4*  HCT 31.4* 30.0* 20.1* 23.7*  PLT 154 172 138* 150  CREATININE 5.93*  --  3.83* 4.92*     Estimated Creatinine Clearance: 10.1 mL/min (A) (by C-G formula based on SCr of 4.92 mg/dL (H)).   Medical History: Past Medical History:  Diagnosis Date   Asthma    Atrial flutter with rapid ventricular response ([place]) 12/25/2021   Chronic kidney disease    dialysis Tues Thurs Sat   Dysrhythmia    PAF 10/2019 in setting of COVID-19 PNA   Gout    Hypertension      [name]  with history of ESRD on HD TTS-presenting on 12/16/21  with sepsis due to MRSA bacteremia. Currently on Vancomycin on HD days,  End date for Vanc is 02/13/22.  A right atrial/IVC globular mobile mass compatible with thrombotic material/vegetative material seen on TEE on 1/17. Dr. Sloan Leiter  discussed this with cardiologist-Dr. Marlou Porch who -probably a thrombus related to HD catheter-recommends at least 6 months of anticoagulation.   Pharmacy consulted to start Apixaban for right atrial thrombus.  She was not on anticoagulation prior to admission.  Copay $45 for 30 day supply  Patient has been bleeding from catheter site after starting apixaban and vascular consulted. Unable to stop bleeding with pressure and rapid response called. Dr. Scot Dock placed a suture to stop the bleeding and applied Dermabond. Patient was unable to tolerate HD yesterday and upon check of CBC, Hgb was 7 and plts was 138. 1 PRBC  transfusion was given last night and HD catheter site was no longer bleeding. Apixaban will be switched to heparin since it has a quicker on/off time compared to apixaban. Because of RA thrombus and risk of developing a PE, still need full dose anticoagulation.  Goal of Therapy:  Monitor platelets by anticoagulation protocol: Yes   Plan:  Held apixaban 24hr per vascular. Start heparin at 1050 units/hr at 1000 Follow up 8h heparin level and aPTT Daily heparin level, aPTT, and CBC Monitor for s/sx of bleeding Follow up when patient is stable enough to transition back to eliquis. Ideally would be before discharge, will need educate patient about apixaban.   Varney Daily, PharmD PGY1 Pharmacy Resident  Please check AMION for all [facility] pharmacy phone numbers After 10:00 PM call main pharmacy [phone]

## 2021-12-28 DIAGNOSIS — B958 Unspecified staphylococcus as the cause of diseases classified elsewhere: Secondary | ICD-10-CM | POA: Diagnosis not present

## 2021-12-28 DIAGNOSIS — I4892 Unspecified atrial flutter: Secondary | ICD-10-CM | POA: Diagnosis not present

## 2021-12-28 DIAGNOSIS — I330 Acute and subacute infective endocarditis: Secondary | ICD-10-CM | POA: Diagnosis not present

## 2021-12-28 DIAGNOSIS — N186 End stage renal disease: Secondary | ICD-10-CM | POA: Diagnosis not present

## 2021-12-28 LAB — RENAL FUNCTION PANEL
Albumin: 2.1 g/dL — ABNORMAL LOW (ref 3.5–5.0)
Anion gap: 13 (ref 5–15)
BUN: 46 mg/dL — ABNORMAL HIGH (ref 8–23)
CO2: 25 mmol/L (ref 22–32)
Calcium: 8.9 mg/dL (ref 8.9–10.3)
Chloride: 89 mmol/L — ABNORMAL LOW (ref 98–111)
Creatinine, Ser: 6.72 mg/dL — ABNORMAL HIGH (ref 0.44–1.00)
GFR, Estimated: 6 mL/min — ABNORMAL LOW (ref >60)
Glucose, Bld: 88 mg/dL (ref 70–99)
Phosphorus: 7.5 mg/dL — ABNORMAL HIGH (ref 2.5–4.6)
Potassium: 4.3 mmol/L (ref 3.5–5.1)
Sodium: 127 mmol/L — ABNORMAL LOW (ref 135–145)

## 2021-12-28 LAB — CBC
HCT: 25.9 % — ABNORMAL LOW (ref 36.0–46.0)
Hemoglobin: 8.9 g/dL — ABNORMAL LOW (ref 12.0–15.0)
MCH: 31.3 pg (ref 26.0–34.0)
MCHC: 34.4 g/dL (ref 30.0–36.0)
MCV: 91.2 fL (ref 80.0–100.0)
Platelets: 186 K/uL (ref 150–400)
RBC: 2.84 MIL/uL — ABNORMAL LOW (ref 3.87–5.11)
RDW: 18.1 % — ABNORMAL HIGH (ref 11.5–15.5)
WBC: 11.2 K/uL — ABNORMAL HIGH (ref 4.0–10.5)
nRBC: 0 % (ref 0.0–0.2)

## 2021-12-28 LAB — APTT: aPTT: 77 seconds — ABNORMAL HIGH (ref 24–36)

## 2021-12-28 LAB — RESP PANEL BY RT-PCR (FLU A&B, COVID) ARPGX2
Influenza A by PCR: NEGATIVE
Influenza B by PCR: NEGATIVE
SARS Coronavirus 2 by RT PCR: NEGATIVE

## 2021-12-28 LAB — HEPARIN LEVEL (UNFRACTIONATED): Heparin Unfractionated: >1.1 IU/mL — ABNORMAL HIGH (ref 0.30–0.70)

## 2021-12-28 MED ORDER — VANCOMYCIN HCL 500 MG/100ML IV SOLN
500.0000 mg | Freq: Once | INTRAVENOUS | Status: AC
  Administered 2021-12-28: 500 mg via INTRAVENOUS
  Filled 2021-12-28: qty 100

## 2021-12-28 NOTE — Progress Notes (Addendum)
Progress Note  Patient Name: Diamond [name] Date of Encounter: 12/28/2021  [facility] Cardiologist: None   Subjective   Seen in HD, not talking much, no chest pain  Inpatient Medications    Scheduled Meds:  apixaban   Does not apply Once   Chlorhexidine Gluconate Cloth  6 each Topical Q0600   [START ON 12/31/2021] darbepoetin (ARANESP) injection - DIALYSIS  60 mcg Intravenous Q Thu-HD   heparin sodium (porcine)  3,200 Units Intravenous Q M,W,F-HD   hydrALAZINE  25 mg Oral Q8H   metoprolol tartrate  37.5 mg Oral BID   polyethylene glycol  34 g Oral Daily   senna-docusate  2 tablet Oral BID   sevelamer carbonate  1,600 mg Oral TID WC   Continuous Infusions:  sodium chloride     sodium chloride     heparin 1,200 Units/hr (12/28/21 0454)   vancomycin 500 mg (12/24/21 1736)   PRN Meds: sodium chloride, sodium chloride, acetaminophen **OR** acetaminophen, albuterol, alteplase, heparin, hydrALAZINE, lidocaine (PF), lidocaine-prilocaine, naLOXone (NARCAN)  injection, oxyCODONE, pentafluoroprop-tetrafluoroeth, polyethylene glycol   Vital Signs    Vitals:   12/27/21 1500 12/27/21 2138 12/28/21 0000 12/28/21 0425  BP: 140/86 (!) 172/104 (!) 137/100 (!) 145/80  Pulse: 86 95 92 79  Resp: (!) 22 17 19 17   Temp:  99 F (37.2 C) 98.9 F (37.2 C) 98.7 F (37.1 C)  TempSrc:  Oral Oral Oral  SpO2:  99% 98% 99%  Weight:    65.4 kg  Height:        Intake/Output Summary (Last 24 hours) at 12/28/2021 0759 Last data filed at 12/28/2021 0454 Gross per 24 hour  Intake 563.19 ml  Output --  Net 563.19 ml   Last 3 Weights 12/28/2021 12/27/2021 12/26/2021  Weight (lbs) 144 lb 2.9 oz 141 lb 8.6 oz 143 lb 8.3 oz  Weight (kg) 65.4 kg 64.2 kg 65.1 kg      TELEMETRY     On tele in HD, atrial flutter  ECG    None today - Personally Reviewed  Physical Exam   GEN: No acute distress.   Neck: +JVD,  difficult to assess secondary to position and equipment Cardiac: Irregular rate  and rhythm, soft murmur, no rubs, or gallops.  Respiratory: diminished to auscultation bilaterally with rales in the bases. GI: Soft, nontender, non-distended  MS: No edema; No deformity. Neuro:  Nonfocal  Psych: Normal affect   Labs    High Sensitivity Troponin:   Recent Labs  Lab 12/24/21 0039 12/24/21 0508  TROPONINIHS 24* 28*     Chemistry Recent Labs  Lab 12/26/21 1530 12/27/21 0125 12/28/21 0702  NA 130* 128* 127*  K 3.6 4.0 4.3  CL 93* 91* 89*  CO2 26 24 25   GLUCOSE 101* 92 88  BUN 26* 35* 46*  CREATININE 3.83* 4.92* 6.72*  CALCIUM 8.1* 8.8* 8.9  PROT 5.0*  --   --   ALBUMIN 1.6* 1.9* 2.1*  AST 15  --   --   ALT 8  --   --   ALKPHOS 49  --   --   BILITOT 0.4  --   --   GFRNONAA 12* 9* 6*  ANIONGAP 11 13 13     Lipids No results for input(s): CHOL, TRIG, HDL, LABVLDL, LDLCALC, CHOLHDL in the last 168 hours.  Hematology Recent Labs  Lab 12/26/21 1530 12/27/21 0125 12/28/21 0702  WBC 15.3* 10.8* 11.2*  RBC 2.19* 2.66* 2.84*  HGB 7.0* 8.4* 8.9*  HCT 20.1* 23.7* 25.9*  MCV 91.8 89.1 91.2  MCH 32.0 31.6 31.3  MCHC 34.8 35.4 34.4  RDW 17.4* 17.9* 18.1*  PLT 138* 150 186   Thyroid No results for input(s): TSH, FREET4 in the last 168 hours.  BNPNo results for input(s): BNP, PROBNP in the last 168 hours.  DDimer No results for input(s): DDIMER in the last 168 hours.   Radiology    DG CHEST PORT 1 VIEW  Result Date: 12/26/2021 CLINICAL DATA:  Hypotension. EXAM: PORTABLE CHEST 1 VIEW COMPARISON:  12/23/2021 FINDINGS: 1525 hours. The lungs are clear without focal pneumonia, edema, pneumothorax or pleural effusion. The cardiopericardial silhouette is within normal limits for size. Right IJ central line tip overlies the upper right atrium. Bones are diffusely demineralized. IMPRESSION: 1. No acute cardiopulmonary findings. 2. Right IJ central line tip overlies the upper right atrium. Electronically Signed   By: Misty Stanley M.D.   On: 12/26/2021 15:30     Cardiac Studies   TEE: 12/22/2021  1. There is a 2.1 x 1.37 mutilobulated mobile mass attached to the RA/IVC junction which has the appearance of thrombus/vegetation in the setting of bacteremia and recently removed dialysis catheter.   2. Left ventricular ejection fraction, by estimation, is 60 to 65%. The  left ventricle has normal function. The left ventricle has no regional  wall motion abnormalities.   3. Right ventricular systolic function is normal. The right ventricular  size is normal.   4. No left atrial/left atrial appendage thrombus was detected.   5. No mitral valve vegetation. The mitral valve is degenerative. Trivial  mitral valve regurgitation. No evidence of mitral stenosis.   6. The aortic valve is normal in structure. Aortic valve regurgitation is  not visualized. No aortic stenosis is present.   7. The inferior vena cava is normal in size with greater than 50%  respiratory variability, suggesting right atrial pressure of 3 mmHg.   Patient Profile     71 y.o. female with ESRD on iHD, asthma, HTN, anemia of chronic disease who was seen 12/25/2021 for new diagnosis of atrial flutter.   Assessment & Plan    #Bleeding R IJ access site -Bleeding has stopped, HD catheter being used - Eliquis on hold  #Atrial flutter -She developed this as they were preparing to send her to a SNF because of her MRSA sepsis and dialysis -Had been on apixaban, but this was held because of right IJ HD site bleeding causing hypotension and requiring transfusion. - No apparent symptoms from the atrial flutter - Since patient cannot reliably be anticoagulated for several reasons, amiodarone was discontinued - She is tolerating metoprolol 37.5 mg twice daily - Had a TEE on 1/17, but no cardioversion because of a mass. - Initial plan was to anticoagulate and revisit cardioversion as an outpatient - Discuss with MD if we should restart Eliquis  #ESRD on HD - 1/19 RN progress note at 12:16  PM documents conversation that the RN had with the patient where the patient states she declines to go to dialysis services. - Management per Nephrology and IM  #Sepsis due to MRSA bacteremia, L5-S1 discitis - Vegetation in the atrium seen on TEE was possibly thrombotic/vegetative material - ID recommended treat for endocarditis with IV vancomycin through 02/13/2022 -The mass is also possibly thrombotic related to a previous dialysis catheter  Otherwise, per IM and Nephrology  For questions or updates, please contact [place] Please consult [email or web address] for contact info under  Signed, Rosaria Ferries, PA-C  12/28/2021, 7:59 AM    I have seen and examined the patient along with Rosaria Ferries, PA-C .  I have reviewed the chart, notes and new data.  I agree with PA/NP's note.  Key new complaints: no CV complaints Key examination changes: no bleeding at this time. On HD Key new findings / data: rate controlled atrial flutter  PLAN: [facility] will sign off.   Medication Recommendations:  Restart Eliquis 5 mg twice daily Other recommendations (labs, testing, etc):  repeat transthoracic echo after completion of IV antibiotics (02/13/2022) Follow up as an outpatient:  F/U after echo is performed   Sanda Klein, MD, [place] [phone] 12/28/2021, 10:58 AM

## 2021-12-28 NOTE — Progress Notes (Signed)
[facility] Progress Note   Subjective:    Seen in HD unit. Seems a little disoriented this am, asking why she's here.  Objective Vitals:   12/28/21 0754 12/28/21 0800 12/28/21 0830 12/28/21 0900  BP: (!) 144/82 (!) 118/98 (!) 126/53 132/67  Pulse: 91 87 88 86  Resp:      Temp:      TempSrc:      SpO2:      Weight:      Height:       Physical Exam General: Lying in bed; NAD Heart: Normal S1 and S2; No murmurs, gallops, or rubs Lungs: Clear throughout; No wheezing, rales, or rhonchi Abdomen: Soft and non-tender Extremities: No edema BLLE Dialysis Access: R IJ TDC (new)-no bleeding noted; dressing clean, dry, and intact; L AVF (+) Bruit/Thrill (new)   Filed Weights   12/27/21 0333 12/28/21 0425 12/28/21 0745  Weight: 64.2 kg 65.4 kg 65.2 kg    Intake/Output Summary (Last 24 hours) at 12/28/2021 0948 Last data filed at 12/28/2021 0454 Gross per 24 hour  Intake 323.19 ml  Output --  Net 323.19 ml     Additional Objective Labs: Basic Metabolic Panel: Recent Labs  Lab 12/26/21 0059 12/26/21 1530 12/27/21 0125 12/28/21 0702  NA 126* 130* 128* 127*  K 3.6 3.6 4.0 4.3  CL 88* 93* 91* 89*  CO2 25 26 24 25   GLUCOSE 106* 101* 92 88  BUN 43* 26* 35* 46*  CREATININE 5.93* 3.83* 4.92* 6.72*  CALCIUM 10.0 8.1* 8.8* 8.9  PHOS 7.3*  --  6.7* 7.5*    Liver Function Tests: Recent Labs  Lab 12/26/21 1530 12/27/21 0125 12/28/21 0702  AST 15  --   --   ALT 8  --   --   ALKPHOS 49  --   --   BILITOT 0.4  --   --   PROT 5.0*  --   --   ALBUMIN 1.6* 1.9* 2.1*    No results for input(s): LIPASE, AMYLASE in the last 168 hours. CBC: Recent Labs  Lab 12/26/21 0059 12/26/21 1036 12/26/21 1530 12/27/21 0125 12/28/21 0702  WBC 9.6 12.2* 15.3* 10.8* 11.2*  HGB 10.4* 10.3* 7.0* 8.4* 8.9*  HCT 31.4* 30.0* 20.1* 23.7* 25.9*  MCV 92.1 93.2 91.8 89.1 91.2  PLT 154 172 138* 150 186    Blood Culture    Component Value Date/Time   SDES BLOOD RIGHT HAND  12/19/2021 0643   SPECREQUEST  12/19/2021 0643    BOTTLES DRAWN AEROBIC ONLY Blood Culture results may not be optimal due to an inadequate volume of blood received in culture bottles   CULT  12/19/2021 0643    NO GROWTH 5 DAYS Performed at [facility] [address], [place], [place] [address]    REPTSTATUS 12/24/2021 FINAL 12/19/2021 8563    Cardiac Enzymes: No results for input(s): CKTOTAL, CKMB, CKMBINDEX, TROPONINI in the last 168 hours. CBG: Recent Labs  Lab 12/26/21 1021 12/26/21 1512  GLUCAP 103* 129*    Iron Studies: No results for input(s): IRON, TIBC, TRANSFERRIN, FERRITIN in the last 72 hours. Lab Results  Component Value Date   INR 1.0 10/16/2019   Studies/Results: DG CHEST PORT 1 VIEW  Result Date: 12/26/2021 CLINICAL DATA:  Hypotension. EXAM: PORTABLE CHEST 1 VIEW COMPARISON:  12/23/2021 FINDINGS: 1525 hours. The lungs are clear without focal pneumonia, edema, pneumothorax or pleural effusion. The cardiopericardial silhouette is within normal limits for size. Right IJ central line tip  overlies the upper right atrium. Bones are diffusely demineralized. IMPRESSION: 1. No acute cardiopulmonary findings. 2. Right IJ central line tip overlies the upper right atrium. Electronically Signed   By: Misty Stanley M.D.   On: 12/26/2021 15:30    Medications:  sodium chloride     sodium chloride     heparin 1,200 Units/hr (12/28/21 0454)   vancomycin 500 mg (12/24/21 1736)    apixaban   Does not apply Once   Chlorhexidine Gluconate Cloth  6 each Topical Q0600   [START ON 12/31/2021] darbepoetin (ARANESP) injection - DIALYSIS  60 mcg Intravenous Q Thu-HD   heparin sodium (porcine)  3,200 Units Intravenous Q M,W,F-HD   hydrALAZINE  25 mg Oral Q8H   metoprolol tartrate  37.5 mg Oral BID   polyethylene glycol  34 g Oral Daily   senna-docusate  2 tablet Oral BID   sevelamer carbonate  1,600 mg Oral TID WC    Dialysis Orders: TTS HD at [facility] 4h  400/600  68.5kg  3/2  bath  Hep 2000   - hect 4 ug tiw - venofer 50 q wk - no esa, last Hb 11.9  Assessment/Plan: Sepsis/ MRSA bacteremia - 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 As well concern for vertebral osteo.  ID recommended IV vanc and line holiday. Repeat blood Cx 1/14 neg to date. TTE showed MV vegetation, recommended TEE.   TEE - neg for MV vegetation but R atrial mass seen. Felt to be thrombus related to HD cath and will need 6 months of AC ( TDC removed by VVS on 1/14 for line holiday. New TDC + AVF placed 1/18.  Vancomycin with HD for 8 weeks (end date 02/13/22)  ESRD -HD TTS. Unable to dialyze Saturday d/t AMS and low Bps. Suspected acute blood loss from [facility] -bleeding has stopped. Patient doing much better after transfusion. HD today -off schedule.  Afib/A-flutter-Cardiology following --on metoprolol. Eliquis on hold d/t bleeding  Access - New R IJ TDC + L BB 1st stage AVF placed 1/18 Dr. Stanford Breed.  HTN/volume -BP improving. UF as able.  Anemia -Hgb 8.9  s/p 1 unit PRBCs 12/26/21. Hgb now 8.4. Will raise Aranesp for next scheduled dose 1/26.  Secondary hyperparathyroidism -Corrected Ca elevated. Phos trending down. Continue Renvela. Hold vitamin D with ^ Ca ,used 2.0 calcium bath.  Right-sided pulmonary nodule.  follow-up intervention per primary/pulm Lower back pain.  Possible osteo-/discitis on MRI LS. Pain management per primary   Diamond Child PA-C [facility] 12/28/2021,9:48 AM

## 2021-12-28 NOTE — Progress Notes (Signed)
Diamond [name] for Apixaban>heparin Indication: RA thrombus  No Known Allergies  Patient Measurements: Height: 5\' 7"  (170.2 cm) Weight: 64.1 kg (141 lb 5 oz) IBW/kg (Calculated) : 61.6 Heparin dosing weight: 64.2 kg   Vital Signs: Temp: 97.5 F (36.4 C) (01/23 1105) Temp Source: Temporal (01/23 1105) BP: 111/79 (01/23 1105) Pulse Rate: 96 (01/23 1105)  Labs: Recent Labs    12/26/21 1530 12/27/21 0125 12/27/21 1053 12/27/21 1918 12/28/21 0702  HGB 7.0* 8.4*  --   --  8.9*  HCT 20.1* 23.7*  --   --  25.9*  PLT 138* 150  --   --  186  APTT  --   --  60* 55* 77*  HEPARINUNFRC  --   --   --  >1.10* >1.10*  CREATININE 3.83* 4.92*  --   --  6.72*     Estimated Creatinine Clearance: 7.4 mL/min (A) (by C-G formula based on SCr of 6.72 mg/dL (H)).   Medical History: Past Medical History:  Diagnosis Date   Asthma    Atrial flutter with rapid ventricular response ([place]) 12/25/2021   Chronic kidney disease    dialysis Tues Thurs Sat   Dysrhythmia    PAF 10/2019 in setting of COVID-19 PNA   Gout    Hypertension      [name]  with history of ESRD on HD TTS-presenting on 12/16/21  with sepsis due to MRSA bacteremia. Currently on Vancomycin on HD days,  End date for Vanc is 02/13/22.  A right atrial/IVC globular mobile mass compatible with thrombotic material/vegetative material seen on TEE on 1/17. Dr. Sloan Leiter  discussed this with cardiologist-Dr. Marlou Porch who -probably a thrombus related to HD catheter-recommends at least 6 months of anticoagulation.   Pharmacy consulted to start Apixaban for right atrial thrombus.  She was not on anticoagulation prior to admission.  Copay $45 for 30 day supply  Patient has been bleeding from catheter site after starting apixaban and vascular consulted. Unable to stop bleeding with pressure and rapid response called. Dr. Scot Dock placed a suture to stop the bleeding and applied Dermabond.Marland [name]  Apixaban  switched to heparin since it has a quicker on/off time. Because of RA thrombus and risk of developing a PE, still need full dose anticoagulation. PTT 77 so with in goal. HL still elevated >1.1 due to apixaban.   Goal of Therapy:  Monitor platelets by anticoagulation protocol: Yes   Plan:  -Continue heparin 1200 units/hr -Daily PTT, CBC, and HL  Diamond [name], PharmD, [place], AAHIVP, CPP Infectious Disease Pharmacist 12/28/2021 12:49 PM

## 2021-12-28 NOTE — Progress Notes (Signed)
Spoke with both Kynlei Piontek (daughter) and Ieshia Hatcher (son) in regards to patient. Answered questions within scope of practice. Will refer further questions to Attending Physician.

## 2021-12-28 NOTE — Progress Notes (Signed)
PROGRESS NOTE        PATIENT DETAILS Name: Diamond [name] Age: 71 y.o. Sex: female Date of Birth: 1951/05/09 Admit Date: 12/16/2021 Admitting Physician Rhetta Mura, DO CNO:[id number], Iona Beard, MD  Brief Narrative: 71 year old female with history of ESRD on HD TTS-presenting with sepsis due to MRSA bacteremia.  Hospital course complicated by A. fib RVR, bleeding from HD catheter site on 1/21 causing hypotension and acute blood loss anemia.  See below for further details   Subjective: Seen earlier this morning at hemodialysis.  Lying comfortably in bed without any major issues.  Objective: Vitals: Blood pressure 111/79, pulse 96, temperature (!) 97.5 F (36.4 C), temperature source Temporal, resp. rate 19, height 5\' 7"  (1.702 m), weight 64.1 kg, SpO2 100 %.   Exam: Gen Exam:Alert awake-not in any distress HEENT:atraumatic, normocephalic Chest: B/L clear to auscultation anteriorly CVS:S1S2 regular Abdomen:soft non tender, non distended Extremities:no edema Neurology: Non focal Skin: no rash   Pertinent Labs/Radiology: CBC Latest Ref Rng & Units 12/28/2021 12/27/2021 12/26/2021  WBC 4.0 - 10.5 K/uL 11.2(H) 10.8(H) 15.3(H)  Hemoglobin 12.0 - 15.0 g/dL 8.9(L) 8.4(L) 7.0(L)  Hematocrit 36.0 - 46.0 % 25.9(L) 23.7(L) 20.1(L)  Platelets 150 - 400 K/uL 186 150 138(L)    Lab Results  Component Value Date   NA 127 (L) 12/28/2021   K 4.3 12/28/2021   CL 89 (L) 12/28/2021   CO2 25 12/28/2021      Assessment/Plan: * Severe sepsis ([place]) due to MRSA bacteremia, L5-S1 discitis and endocarditis- (present on admission) Sepsis physiology has resolved-repeat blood cultures negative so far-  TEE on 1/17 without obvious valvular vegetations-but did have a right atrial/IVC globular mobile mass compatible with thrombotic/vegetative material.  Reasonable to treat like endocarditis with embolic phenomena causing discitis.  ID recommending IV vancomycin-end  date-02/13/2022.   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 seen on TEE on 1/17 Discussed with cardiologist-Dr. Marlou Porch on 1/18-probably a thrombus related to HD catheter-recommends at least 6 months of anticoagulation-however now with atrial flutter-will require indefinite anticoagulation.    After discussion with Dr. Hayden Rasmussen surgery on 1/19-full dose anticoagulation was started.  Unfortunately developed bleeding from HD catheter site on 1/21-anticoagulation was held-after hemostasis was obtained-patient was restarted on anticoagulation with IV heparin after discussion with Dr. Scot Dock on 1/22.  Plans are to resume Eliquis prior to discharge.   Atrial flutter with rapid ventricular response-CHA2DS2-VASc 4 Developed RVR on 1/20-initially started on Cardizem infusion-but then transition to amiodarone infusion by cardiology.  Maintaining NSR-no longer on amiodarone-has been transitioned to metoprolol.  Currently on IV heparin with plans to resume Eliquis when closer to discharge    Hypotension Occurred on 1/21-due to blood loss from bleeding HD catheter site.  Resolved with PRBC transfusion and supportive care.  BP now stable.  Bleeding from dialysis catheter site Occurred around 6:45 AM on 1/21-pressure held-eventually seen by vascular surgery-and sutured at bedside.  No further bleeding since then.   Multifactorial anemia-acute blood loss from bleeding HD catheter site-superimposed on anemia related to ESRD.- (present on admission) Due to worsening hemoglobin from HD catheter site bleeding-transfused 1 unit of PRBC on 01/21.  Hemoglobin now stable.  Nephrology following-we will defer Aranesp/IV iron to them.  ESRD (end stage renal disease) ([place])- (present on admission) Due to bacteremia given line holiday-dialysis catheter was removed on 1/14, on 1/18-vascular surgery placed right IJ [facility]  and left brachial AV fistula.  Nephrology following-defer HD care to  nephrology.  Hyponatremia- (present on admission) Volume management with HD-follow.  Hypertension- (present on admission) BP stable-continue beta-blocker.  No longer on verapamil/hydralazine.  Mild intermittent asthma- (present on admission)  Stable-continue as needed bronchodilators.  Lung nodule seen on imaging study- (present on admission)  will need repeat CT chest in 23-month  BMI: Estimated body mass index is 22.13 kg/m as calculated from the following:   Height as of this encounter: 5\' 7"  (1.702 m).   Weight as of this encounter: 64.1 kg.   Procedures:  1/14>> HD catheter removed 1/17>> TEE 1/18>> right IJ tunnel catheter placement, left brachiocephalic for stage AV fistula creation   DVT Prophylaxis: Eliquis. Consults: Nephrology, vascular surgery, infectious disease, cardiology Code Status:Full code  Family Communication:Son Marchia Bond- [phone]-updated on 1/23   Disposition Plan: Status is: Inpatient  Remains inpatient appropriate because: MRSA bacteremia-on IV vancomycin-recommendations are for SNF on discharge    Diet: Diet Order             Diet renal with fluid restriction Fluid restriction: 2000 mL Fluid; Room service appropriate? Yes; Fluid consistency: Thin  Diet effective now                     Antimicrobial agents: Anti-infectives (From admission, onward)    Start     Dose/Rate Route Frequency Ordered Stop   12/26/21 1200  vancomycin (VANCOREADY) IVPB 500 mg/100 mL  Status:  Discontinued        500 mg 100 mL/hr over 60 Minutes Intravenous Every T-Th-Sa (Hemodialysis) 12/24/21 1134 12/24/21 1158   12/24/21 1245  vancomycin (VANCOREADY) IVPB 500 mg/100 mL        500 mg 100 mL/hr over 60 Minutes Intravenous Every T-Th-Sa (Hemodialysis) 12/24/21 1158     12/24/21 1230  vancomycin (VANCOREADY) IVPB 500 mg/100 mL  Status:  Discontinued        500 mg 100 mL/hr over 60 Minutes Intravenous  Once 12/24/21 1134 12/24/21 1147   12/23/21 1456   ceFAZolin (ANCEF) 2-4 GM/100ML-% IVPB  Status:  Discontinued       Note to Pharmacy: Gregery Na H: cabinet override      12/23/21 1456 12/23/21 1515   12/23/21 1200  vancomycin (VANCOREADY) IVPB 500 mg/100 mL  Status:  Discontinued        500 mg 100 mL/hr over 60 Minutes Intravenous  Once 12/22/21 1555 12/23/21 2216   12/22/21 1200  vancomycin (VANCOREADY) IVPB 500 mg/100 mL  Status:  Discontinued        500 mg 100 mL/hr over 60 Minutes Intravenous Every T-Th-Sa (Hemodialysis) 12/22/21 0842 12/24/21 1134   12/18/21 0945  vancomycin (VANCOREADY) IVPB 750 mg/150 mL        750 mg 150 mL/hr over 60 Minutes Intravenous  Once 12/18/21 0845 12/18/21 1138   12/17/21 2200  ceFEPIme (MAXIPIME) 1 g in sodium chloride 0.9 % 100 mL IVPB  Status:  Discontinued        1 g 200 mL/hr over 30 Minutes Intravenous Every 24 hours 12/17/21 0129 12/17/21 0326   12/17/21 1800  cefTRIAXone (ROCEPHIN) 1 g in sodium chloride 0.9 % 100 mL IVPB  Status:  Discontinued        1 g 200 mL/hr over 30 Minutes Intravenous Every 24 hours 12/17/21 0326 12/17/21 1631   12/17/21 1445  vancomycin (VANCOREADY) IVPB 750 mg/150 mL  Status:  Discontinued  750 mg 150 mL/hr over 60 Minutes Intravenous Every T-Th-Sa (Hemodialysis) 12/17/21 1432 12/22/21 0842   12/17/21 1400  metroNIDAZOLE (FLAGYL) tablet 500 mg  Status:  Discontinued        500 mg Oral Every 8 hours 12/17/21 1345 12/17/21 1631   12/17/21 1200  vancomycin (VANCOREADY) IVPB 750 mg/150 mL  Status:  Discontinued        750 mg 150 mL/hr over 60 Minutes Intravenous Every T-Th-Sa (Hemodialysis) 12/17/21 0129 12/17/21 0326   12/17/21 0130  vancomycin (VANCOREADY) IVPB 1500 mg/300 mL        1,500 mg 150 mL/hr over 120 Minutes Intravenous  Once 12/17/21 0115 12/17/21 0537   12/17/21 0115  ceFEPIme (MAXIPIME) 2 g in sodium chloride 0.9 % 100 mL IVPB        2 g 200 mL/hr over 30 Minutes Intravenous  Once 12/17/21 0113 12/17/21 0315   12/17/21 0115  metroNIDAZOLE  (FLAGYL) IVPB 500 mg        500 mg 100 mL/hr over 60 Minutes Intravenous  Once 12/17/21 0113 12/17/21 0526   12/17/21 0115  vancomycin (VANCOCIN) IVPB 1000 mg/200 mL premix  Status:  Discontinued        1,000 mg 200 mL/hr over 60 Minutes Intravenous  Once 12/17/21 0113 12/17/21 0115        MEDICATIONS: Scheduled Meds:  apixaban   Does not apply Once   Chlorhexidine Gluconate Cloth  6 each Topical Q0600   [START ON 12/31/2021] darbepoetin (ARANESP) injection - DIALYSIS  60 mcg Intravenous Q Thu-HD   heparin sodium (porcine)  3,200 Units Intravenous Q M,W,F-HD   hydrALAZINE  25 mg Oral Q8H   metoprolol tartrate  37.5 mg Oral BID   polyethylene glycol  34 g Oral Daily   senna-docusate  2 tablet Oral BID   sevelamer carbonate  1,600 mg Oral TID WC   Continuous Infusions:  heparin 1,200 Units/hr (12/28/21 0454)   vancomycin 500 mg (12/24/21 1736)   PRN Meds:.acetaminophen **OR** acetaminophen, albuterol, hydrALAZINE, naLOXone (NARCAN)  injection, oxyCODONE, polyethylene glycol   I have personally reviewed following labs and imaging studies  LABORATORY DATA: CBC: Recent Labs  Lab 12/26/21 0059 12/26/21 1036 12/26/21 1530 12/27/21 0125 12/28/21 0702  WBC 9.6 12.2* 15.3* 10.8* 11.2*  HGB 10.4* 10.3* 7.0* 8.4* 8.9*  HCT 31.4* 30.0* 20.1* 23.7* 25.9*  MCV 92.1 93.2 91.8 89.1 91.2  PLT 154 172 138* 150 989    Basic Metabolic Panel: Recent Labs  Lab 12/24/21 0508 12/26/21 0059 12/26/21 1530 12/27/21 0125 12/28/21 0702  NA 130* 126* 130* 128* 127*  K 4.3 3.6 3.6 4.0 4.3  CL 94* 88* 93* 91* 89*  CO2 18* 25 26 24 25   GLUCOSE 91 106* 101* 92 88  BUN 81* 43* 26* 35* 46*  CREATININE 8.86* 5.93* 3.83* 4.92* 6.72*  CALCIUM 9.3 10.0 8.1* 8.8* 8.9  PHOS 10.2* 7.3*  --  6.7* 7.5*    GFR: Estimated Creatinine Clearance: 7.4 mL/min (A) (by C-G formula based on SCr of 6.72 mg/dL (H)).  Liver Function Tests: Recent Labs  Lab 12/24/21 0508 12/26/21 0059 12/26/21 1530  12/27/21 0125 12/28/21 0702  AST  --   --  15  --   --   ALT  --   --  8  --   --   ALKPHOS  --   --  49  --   --   BILITOT  --   --  0.4  --   --  PROT  --   --  5.0*  --   --   ALBUMIN 2.1* 2.2* 1.6* 1.9* 2.1*   No results for input(s): LIPASE, AMYLASE in the last 168 hours. No results for input(s): AMMONIA in the last 168 hours.  Coagulation Profile: No results for input(s): INR, PROTIME in the last 168 hours.  Cardiac Enzymes: No results for input(s): CKTOTAL, CKMB, CKMBINDEX, TROPONINI in the last 168 hours.  BNP (last 3 results) No results for input(s): PROBNP in the last 8760 hours.  Lipid Profile: No results for input(s): CHOL, HDL, LDLCALC, TRIG, CHOLHDL, LDLDIRECT in the last 72 hours.  Thyroid Function Tests: No results for input(s): TSH, T4TOTAL, FREET4, T3FREE, THYROIDAB in the last 72 hours.  Anemia Panel: No results for input(s): VITAMINB12, FOLATE, FERRITIN, TIBC, IRON, RETICCTPCT in the last 72 hours.  Urine analysis:    Component Value Date/Time   COLORURINE STRAW (A) 10/01/2021 1234   APPEARANCEUR CLEAR 10/01/2021 1234   LABSPEC 1.009 10/01/2021 1234   PHURINE 7.0 10/01/2021 1234   GLUCOSEU 50 (A) 10/01/2021 1234   HGBUR NEGATIVE 10/01/2021 1234   BILIRUBINUR NEGATIVE 10/01/2021 1234   KETONESUR NEGATIVE 10/01/2021 1234   PROTEINUR 100 (A) 10/01/2021 1234   UROBILINOGEN 0.2 09/27/2021 1329   NITRITE NEGATIVE 10/01/2021 1234   LEUKOCYTESUR TRACE (A) 10/01/2021 1234    Sepsis Labs: Lactic Acid, Venous    Component Value Date/Time   LATICACIDVEN 1.6 12/26/2021 1530    MICROBIOLOGY: Recent Results (from the past 240 hour(s))  MRSA Next Gen by PCR, Nasal     Status: Abnormal   Collection Time: 12/19/21 12:40 AM   Specimen: Nasal Mucosa; Nasal Swab  Result Value Ref Range Status   MRSA by PCR Next Gen DETECTED (A) NOT DETECTED Final    Comment: RESULT CALLED TO, READ BACK BY AND VERIFIED WITH: RN M.ENDISON 12/19/21@5 :45 BY TW (NOTE) The  GeneXpert MRSA Assay (FDA approved for NASAL specimens only), is one component of a comprehensive MRSA colonization surveillance program. It is not intended to diagnose MRSA infection nor to guide or monitor treatment for MRSA infections. Test performance is not FDA approved in patients less than 18 years old. Performed at [facility], [place] [address], [place], [place] [address]   Culture, blood (routine x 2)     Status: None   Collection Time: 12/19/21  6:32 AM   Specimen: BLOOD  Result Value Ref Range Status   Specimen Description BLOOD LEFT ANTECUBITAL  Final   Special Requests   Final    BOTTLES DRAWN AEROBIC ONLY Blood Culture results may not be optimal due to an inadequate volume of blood received in culture bottles   Culture   Final    NO GROWTH 5 DAYS Performed at [facility], [place] [address] [place], [place] [address]    Report Status 12/24/2021 FINAL  Final  Culture, blood (routine x 2)     Status: None   Collection Time: 12/19/21  6:43 AM   Specimen: BLOOD RIGHT HAND  Result Value Ref Range Status   Specimen Description BLOOD RIGHT HAND  Final   Special Requests   Final    BOTTLES DRAWN AEROBIC ONLY Blood Culture results may not be optimal due to an inadequate volume of blood received in culture bottles   Culture   Final    NO GROWTH 5 DAYS Performed at [facility], [place] [address], [place], [place] [address]    Report Status 12/24/2021 FINAL  Final    RADIOLOGY STUDIES/RESULTS: DG CHEST PORT 1 VIEW  Result Date: 12/26/2021 CLINICAL DATA:  Hypotension. EXAM: PORTABLE CHEST 1 VIEW COMPARISON:  12/23/2021 FINDINGS: 1525 hours. The lungs are clear without focal pneumonia, edema, pneumothorax or pleural effusion. The cardiopericardial silhouette is within normal limits for size. Right IJ central line tip overlies the upper right atrium. Bones are diffusely demineralized. IMPRESSION: 1. No acute cardiopulmonary findings. 2. Right IJ central line tip  overlies the upper right atrium. Electronically Signed   By: Misty Stanley M.D.   On: 12/26/2021 15:30     LOS: 11 days   Oren Binet, MD  [facility]    To contact the attending provider between 7A-7P or the covering provider during after hours 7P-7A, please log into the web site [email or web address] and access using universal  password for that web site. If you do not have the password, please call the hospital operator.  12/28/2021, 12:20 PM

## 2021-12-28 NOTE — Progress Notes (Signed)
D/t off schedule HD today, we will give 1 dose of vanc IV. She did  not get a full session on 1/19 due to low BP but did get a dose of vanc. We will check a random level in AM.  Onnie Boer, PharmD, BCIDP, AAHIVP, CPP Infectious Disease Pharmacist 12/28/2021 12:46 PM

## 2021-12-28 NOTE — TOC Progression Note (Addendum)
Transition of Care [facility]) - Progression Note    Patient Details  Name: Diamond [name] MRN: [id number] Date of Birth: 1951/07/18  [facility] CM/SW [place], LCSW Phone Number: 12/28/2021, 9:08 AM  Clinical Narrative:    Tamsen Meek still waiting on insurance approval.   3:55pm-[facility] has received insurance approval. Per MD, likely able to discharge tomorrow. CSW updated patient's son and requested COVID test.     Expected Discharge Plan: [place] Barriers to Discharge: Continued Medical Work up, Ship broker, SNF Pending bed offer  Expected Discharge Plan and Services Expected Discharge Plan: [place] In-house Referral: Clinical Social Work   Post Acute Care Choice: [place] Living arrangements for the past 2 months: Single Family Home                                       Social Determinants of Health (SDOH) Interventions    Readmission Risk Interventions No flowsheet data found.

## 2021-12-28 NOTE — Progress Notes (Signed)
Occupational Therapy Treatment Patient Details Name: Diamond [name] MRN: [id number] DOB: 08-23-1951 Today's Date: 12/28/2021   History of present illness Pt is a 71 y.o. F who presents 12/16/2021 with severe sepsis after presenting from home complaining of right sided back pain. Blood culture showing MRSA. 2D echo significant for possible mitral valve vegetation. Lumbar/thoracic spine MRI significant for discitis/osteomyelitis. Significant PMH: ESRD, asthma, HTN.   OT comments  Stehanie is not progressing this date due to self limiting behaviors despite ample time and encouragement given to participate. Pt continues to require max A and significantly increased time to transfer to EOB with fair sitting balance. Pt's daughter applied lotion to pts LE, encouraged pt to complete task indep, but she declined. Pt initially agreeable to transfer from bed>chair, however after ~20 minutes of sitting EOB pt still "not ready," despite encouragement from therapist, family and gospel music playing. Pt agitated throughout and perseverating on "the doctors making her do things" before she's ready. Pt also stating "Im not ready to leave the hospital," and "they cannot rush me" throughout the session. Pt left sitting EOB with 2 daughters present, not agreeable to transfer back into bed at this time. RN and NT aware. Pt continues to benefit from OT acutely as she allows. D/c recommendation remains appropriate.    Recommendations for follow up therapy are one component of a multi-disciplinary discharge planning process, led by the attending physician.  Recommendations may be updated based on patient status, additional functional criteria and insurance authorization.    Follow Up Recommendations  Skilled nursing-short term rehab (<3 hours/day)    Assistance Recommended at Discharge Frequent or constant Supervision/Assistance  Patient can return home with the following  A lot of help with walking and/or transfers;A lot of  help with bathing/dressing/bathroom;Assistance with cooking/housework;Assist for transportation;Help with stairs or ramp for entrance   Equipment Recommendations  Other (comment)    Recommendations for Other Services      Precautions / Restrictions Precautions Precautions: Fall Precaution Comments: SVT monitor HR Restrictions Weight Bearing Restrictions: No       Mobility Bed Mobility Overal bed mobility: Needs Assistance Bed Mobility: Supine to Sit     Supine to sit: Max assist, HOB elevated     General bed mobility comments: required ~5 minutes to transfer EOB. Pt not giving much effort, and yelling to "slow down"    Transfers Overall transfer level: Needs assistance                 General transfer comment: attempted OOB transfer from bed>chair. Gave pt ~20 minutes, still unable to motivate. Despite encouragement and music, pt still yelling at therapist "im ot ready" & perseverating about the "doctors" trying to get her "to do too much"     Balance Overall balance assessment: Needs assistance Sitting-balance support: Feet supported Sitting balance-Leahy Scale: Poor Sitting balance - Comments: statically sitting with extreme forward flexion for ~20 minutes                                   ADL either performed or assessed with clinical judgement   ADL Overall ADL's : Needs assistance/impaired     Grooming: Total assistance;Bed level Grooming Details (indicate cue type and reason): pt's daughter applied lotion to pt - encouraged pt to apply herself, pt agitated and ignoring therapist         Upper Body Dressing : Moderate assistance;Bed level;Sitting Upper Body  Dressing Details (indicate cue type and reason): requires step by step cues to don gown. limited effort given.                   General ADL Comments: Session llimited to bed mobility and sitting balance.    Extremity/Trunk Assessment Upper Extremity Assessment Upper  Extremity Assessment: Generalized weakness (difficult to assess, pt self limiting and nto following commands due to feeling "rushed.")   Lower Extremity Assessment Lower Extremity Assessment: Defer to PT evaluation        Vision   Vision Assessment?: No apparent visual deficits   Perception Perception Perception: Not tested   Praxis Praxis Praxis: Not tested    Cognition Arousal/Alertness: Awake/alert Behavior During Therapy: Anxious, Flat affect Overall Cognitive Status: Impaired/Different from baseline Area of Impairment: Problem solving, Awareness, Safety/judgement, Following commands, Memory, Attention, Orientation                 Orientation Level: Situation, Time Current Attention Level: Selective Memory: Decreased short-term memory Following Commands: Follows one step commands with increased time Safety/Judgement: Decreased awareness of safety, Decreased awareness of deficits Awareness: Intellectual Problem Solving: Slow processing, Decreased initiation, Requires verbal cues, Requires tactile cues General Comments: pt continues to be very self limiting. Perseverating on the "Doctors" trying to get her to do things before she is "ready." She would not elaborate. Difficult to motivate to participate this session, despite familty present. Pt shuts down with attempt to motivate. Likes gospel music, but it did not help her mood/motivation this session.        Exercises      Shoulder Instructions       General Comments VSS on RA, 2 daughters and granddaughter present    Pertinent Vitals/ Pain       Pain Assessment Pain Assessment: Faces Faces Pain Scale: Hurts a little bit Pain Location: generalized/back with bed mobility Pain Descriptors / Indicators: Guarding Pain Intervention(s): Monitored during session, Limited activity within patient's tolerance   Frequency  Min 2X/week        Progress Toward Goals  OT Goals(current goals can now be found in  the care plan section)  Progress towards OT goals: Not progressing toward goals - comment (Pt self limiting, not participating with therapy this session despite ample time and encouragement from therapist & family)  Acute Rehab OT Goals Patient Stated Goal: "im not ready to leave the hospital" OT Goal Formulation: With patient Time For Goal Achievement: 01/03/22 Potential to Achieve Goals: Good ADL Goals Pt Will Perform Grooming: with modified independence;standing Pt Will Perform Lower Body Dressing: with modified independence;sit to/from stand Pt Will Transfer to Toilet: with modified independence;ambulating Additional ADL Goal #1: Pt will indep verbalize at least 3 energy conservation strategies to apply to the home setting  Plan Discharge plan remains appropriate;Frequency remains appropriate       AM-PAC OT "6 Clicks" Daily Activity     Outcome Measure   Help from another person eating meals?: None Help from another person taking care of personal grooming?: A Lot Help from another person toileting, which includes using toliet, bedpan, or urinal?: A Lot Help from another person bathing (including washing, rinsing, drying)?: A Lot Help from another person to put on and taking off regular upper body clothing?: A Lot Help from another person to put on and taking off regular lower body clothing?: A Lot 6 Click Score: 14    End of Session    OT Visit Diagnosis: Unsteadiness on feet (R26.81);Other  abnormalities of gait and mobility (R26.89);Muscle weakness (generalized) (M62.81);Pain   Activity Tolerance Patient tolerated treatment well;Treatment limited secondary to agitation   Patient Left with call bell/phone within reach;in chair;with chair alarm set   Nurse Communication Mobility status        Time: 1617 [id number] OT Time Calculation (min): 28 min  Charges: OT General Charges $OT Visit: 1 Visit OT Treatments $Therapeutic Activity: 23-37 mins   Tanita Palinkas A  Haila Dena 12/28/2021, 5:31 PM

## 2021-12-29 DIAGNOSIS — R7881 Bacteremia: Secondary | ICD-10-CM | POA: Diagnosis not present

## 2021-12-29 DIAGNOSIS — M4620 Osteomyelitis of vertebra, site unspecified: Secondary | ICD-10-CM | POA: Diagnosis not present

## 2021-12-29 DIAGNOSIS — A419 Sepsis, unspecified organism: Secondary | ICD-10-CM | POA: Diagnosis not present

## 2021-12-29 DIAGNOSIS — N186 End stage renal disease: Secondary | ICD-10-CM | POA: Diagnosis not present

## 2021-12-29 LAB — CBC
HCT: 21.1 % — ABNORMAL LOW (ref 36.0–46.0)
Hemoglobin: 7.1 g/dL — ABNORMAL LOW (ref 12.0–15.0)
MCH: 31.6 pg (ref 26.0–34.0)
MCHC: 33.6 g/dL (ref 30.0–36.0)
MCV: 93.8 fL (ref 80.0–100.0)
Platelets: 144 10*3/uL — ABNORMAL LOW (ref 150–400)
RBC: 2.25 MIL/uL — ABNORMAL LOW (ref 3.87–5.11)
RDW: 18 % — ABNORMAL HIGH (ref 11.5–15.5)
WBC: 8.6 10*3/uL (ref 4.0–10.5)
nRBC: 0 % (ref 0.0–0.2)

## 2021-12-29 LAB — VANCOMYCIN, RANDOM: Vancomycin Rm: 15

## 2021-12-29 LAB — APTT
aPTT: 64 seconds — ABNORMAL HIGH (ref 24–36)
aPTT: 67 seconds — ABNORMAL HIGH (ref 24–36)

## 2021-12-29 LAB — HEPARIN LEVEL (UNFRACTIONATED)
Heparin Unfractionated: 0.82 IU/mL — ABNORMAL HIGH (ref 0.30–0.70)
Heparin Unfractionated: 0.79 IU/mL — ABNORMAL HIGH (ref 0.30–0.70)

## 2021-12-29 LAB — PREPARE RBC (CROSSMATCH)

## 2021-12-29 MED ORDER — SODIUM CHLORIDE 0.9% IV SOLUTION: Freq: Once | INTRAVENOUS | Status: AC

## 2021-12-29 MED ORDER — SODIUM CHLORIDE 0.9 % IV SOLN: 100.0000 mL | INTRAVENOUS | Status: DC | PRN

## 2021-12-29 MED ORDER — SODIUM CHLORIDE 0.9% IV SOLUTION: Freq: Once | INTRAVENOUS | Status: DC

## 2021-12-29 MED ORDER — LIDOCAINE 5 % EX PTCH
1.0000 | MEDICATED_PATCH | Freq: Every day | CUTANEOUS | Status: DC
  Administered 2021-12-30: 02:00:00 1 via TRANSDERMAL
  Filled 2021-12-29: qty 1

## 2021-12-29 MED ORDER — HEPARIN SODIUM (PORCINE) 1000 UNIT/ML DIALYSIS: 1000.0000 [IU] | INTRAMUSCULAR | Status: DC | PRN

## 2021-12-29 MED ORDER — ALTEPLASE 2 MG IJ SOLR: 2.0000 mg | Freq: Once | INTRAMUSCULAR | Status: DC | PRN

## 2021-12-29 MED ORDER — ACETAMINOPHEN 325 MG PO TABS
650.0000 mg | ORAL_TABLET | Freq: Once | ORAL | Status: AC
  Administered 2021-12-29: 15:00:00 650 mg via ORAL
  Filled 2021-12-29: qty 2

## 2021-12-29 MED ORDER — CHLORHEXIDINE GLUCONATE CLOTH 2 % EX PADS
6.0000 | MEDICATED_PAD | Freq: Every day | CUTANEOUS | Status: DC
  Administered 2021-12-29 – 2021-12-30 (×2): 6 via TOPICAL

## 2021-12-29 MED ORDER — METOPROLOL TARTRATE 50 MG PO TABS
50.0000 mg | ORAL_TABLET | Freq: Two times a day (BID) | ORAL | Status: DC
  Administered 2021-12-29 – 2021-12-30 (×3): 50 mg via ORAL
  Filled 2021-12-29 (×3): qty 1

## 2021-12-29 MED ORDER — FUROSEMIDE 10 MG/ML IJ SOLN: 20.0000 mg | Freq: Once | INTRAMUSCULAR | Status: DC

## 2021-12-29 MED ORDER — LIDOCAINE-PRILOCAINE 2.5-2.5 % EX CREA
1.0000 "application " | TOPICAL_CREAM | CUTANEOUS | Status: DC | PRN
  Filled 2021-12-29: qty 5

## 2021-12-29 MED ORDER — LIDOCAINE HCL (PF) 1 % IJ SOLN
5.0000 mL | INTRAMUSCULAR | Status: DC | PRN
  Filled 2021-12-29: qty 5

## 2021-12-29 MED ORDER — PENTAFLUOROPROP-TETRAFLUOROETH EX AERO: 1.0000 "application " | INHALATION_SPRAY | CUTANEOUS | Status: DC | PRN

## 2021-12-29 MED ORDER — DIPHENHYDRAMINE HCL 50 MG/ML IJ SOLN
25.0000 mg | Freq: Once | INTRAMUSCULAR | Status: AC
  Administered 2021-12-29: 15:00:00 25 mg via INTRAVENOUS
  Filled 2021-12-29: qty 1

## 2021-12-29 NOTE — Progress Notes (Signed)
[facility] Progress Note   Subjective:    Seen in room. No new complaints. No cp/dyspnea. Just "disappointed with the way things have gone"   Objective Vitals:   12/29/21 0400 12/29/21 0645 12/29/21 0730 12/29/21 0810  BP: (!) 156/91 (!) 189/106 (!) 168/96 (!) 168/96  Pulse: 81  87 95  Resp: 20  18 16   Temp: 98.9 F (37.2 C)   98.9 F (37.2 C)  TempSrc: Oral   Oral  SpO2: 98%  99% 100%  Weight:      Height:       Physical Exam General: Lying in bed; NAD Heart: Normal S1 and S2; No murmurs, gallops, or rubs Lungs: Clear throughout; No wheezing, rales, or rhonchi Abdomen: Soft and non-tender Extremities: No edema BLLE Dialysis Access: R IJ TDC (new)-no bleeding noted; dressing clean, dry, and intact; L AVF (+) Bruit/Thrill (new)   Filed Weights   12/28/21 0745 12/28/21 1105 12/29/21 0342  Weight: 65.2 kg 64.1 kg 68.9 kg    Intake/Output Summary (Last 24 hours) at 12/29/2021 1142 Last data filed at 12/29/2021 0400 Gross per 24 hour  Intake 497.84 ml  Output 400 ml  Net 97.84 ml     Additional Objective Labs: Basic Metabolic Panel: Recent Labs  Lab 12/26/21 0059 12/26/21 1530 12/27/21 0125 12/28/21 0702  NA 126* 130* 128* 127*  K 3.6 3.6 4.0 4.3  CL 88* 93* 91* 89*  CO2 25 26 24 25   GLUCOSE 106* 101* 92 88  BUN 43* 26* 35* 46*  CREATININE 5.93* 3.83* 4.92* 6.72*  CALCIUM 10.0 8.1* 8.8* 8.9  PHOS 7.3*  --  6.7* 7.5*    Liver Function Tests: Recent Labs  Lab 12/26/21 1530 12/27/21 0125 12/28/21 0702  AST 15  --   --   ALT 8  --   --   ALKPHOS 49  --   --   BILITOT 0.4  --   --   PROT 5.0*  --   --   ALBUMIN 1.6* 1.9* 2.1*    No results for input(s): LIPASE, AMYLASE in the last 168 hours. CBC: Recent Labs  Lab 12/26/21 1036 12/26/21 1530 12/27/21 0125 12/28/21 0702 12/29/21 0129  WBC 12.2* 15.3* 10.8* 11.2* 8.6  HGB 10.3* 7.0* 8.4* 8.9* 7.1*  HCT 30.0* 20.1* 23.7* 25.9* 21.1*  MCV 93.2 91.8 89.1 91.2 93.8  PLT 172 138*  150 186 144*    Blood Culture    Component Value Date/Time   SDES BLOOD RIGHT HAND 12/19/2021 0643   SPECREQUEST  12/19/2021 0643    BOTTLES DRAWN AEROBIC ONLY Blood Culture results may not be optimal due to an inadequate volume of blood received in culture bottles   CULT  12/19/2021 0643    NO GROWTH 5 DAYS Performed at [facility] [address] [place],  [address]    REPTSTATUS 12/24/2021 FINAL 12/19/2021 8841    Cardiac Enzymes: No results for input(s): CKTOTAL, CKMB, CKMBINDEX, TROPONINI in the last 168 hours. CBG: Recent Labs  Lab 12/26/21 1021 12/26/21 1512  GLUCAP 103* 129*    Iron Studies: No results for input(s): IRON, TIBC, TRANSFERRIN, FERRITIN in the last 72 hours. Lab Results  Component Value Date   INR 1.0 10/16/2019   Studies/Results: No results found.  Medications:  heparin 1,200 Units/hr (12/29/21 0400)   vancomycin 500 mg (12/24/21 1736)    sodium chloride   Intravenous Once   apixaban   Does not apply Once   Chlorhexidine  Gluconate Cloth  6 each Topical Q0600   [START ON 12/31/2021] darbepoetin (ARANESP) injection - DIALYSIS  60 mcg Intravenous Q Thu-HD   heparin sodium (porcine)  3,200 Units Intravenous Q M,W,F-HD   hydrALAZINE  25 mg Oral Q8H   metoprolol tartrate  50 mg Oral BID   polyethylene glycol  34 g Oral Daily   senna-docusate  2 tablet Oral BID   sevelamer carbonate  1,600 mg Oral TID WC    Dialysis Orders: [facility]TS HD at [facility] 4h  400/600  68.5kg  3/2 bath  Hep 2000   - hect 4 ug tiw - venofer 50 q wk - no esa, last Hb 11.9  Assessment/Plan: Sepsis/ MRSA bacteremia - 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 As well concern for vertebral osteo.  ID recommended IV vanc and line holiday. Repeat blood Cx 1/14 neg to date. TTE showed MV vegetation, recommended TEE.   TEE - neg for MV vegetation but R atrial mass seen. Felt to be thrombus related to HD cath and will need 6 months of [facility]  TDC removed by VVS on 1/14 for  line holiday. New TDC + AVF placed 1/18.  Vancomycin with HD for 8 weeks (end date 02/13/22)  ESRD -HD TTS. Unable to dialyze Saturday d/t AMS and low Bps. Suspected acute blood loss from [facility] -bleeding has stopped. Had HD Monday off schedule. Short HD today to get back on schedule.  R atrial thrombus -- will need 6 months of AC. Eliquis started but had bleeding from [facility]. Now on heparin gtt and Hgb trending down.  Plans per primary.  Afib/A-flutter-Cardiology following --on metoprolol. Eliquis on hold d/t bleeding  Access - New R IJ TDC + L BB 1st stage AVF placed 1/18 Dr. Stanford Breed.  HTN/volume -BP improving. UF as able.  Anemia -Hgb 8.9>7.1 s/p 1 unit PRBCs 12/26/21. Will give another unit with HD today 1/24.  Will raise Aranesp for next scheduled dose 1/26.  Secondary hyperparathyroidism -Corrected Ca elevated. Continue Renvela. Hold vitamin D with ^ Ca ,used 2.0 calcium bath.  Right-sided pulmonary nodule.  follow-up intervention per primary/pulm Lower back pain.  Possible osteo-/discitis on MRI LS. Pain management per primary  Dispo: -To SNF when stable.   Lynnda Child PA-C [facility] 12/29/2021,11:42 AM

## 2021-12-29 NOTE — Consult Note (Signed)
[facility] Inpatient Consult   12/29/2021  Diamond [name] 04-18-1951 [id number]  [place] Management [facility] CM)   Patient chart reviewed length of stay and noted high risk score for unplanned readmission. Assessed for [facility] CM post hospital chronic care management and care coordination needs.   Per review, patient is being recommended for skilled nursing facility level of care for transition. No THN CM as patient needs will be met at SNF level of care.  Of note, [facility] Care Management services does not replace or interfere with any services that are arranged by inpatient case management or social work.   Netta Cedars, MSN, RN [facility] [employer] [phone]  Toll free office [phone]

## 2021-12-29 NOTE — Progress Notes (Signed)
Spoke with Lalla Brothers (daughter) with request of patient update. Brief update given to daughter regarding patient daily cares. Daughter informed of patient's unwillingness to eat breakfast or lunch. Daughter verbally understood the dynamics of the patient. No further questions from daughter at this time.

## 2021-12-29 NOTE — Progress Notes (Signed)
PIV consult: Second site placed with [place] in R forearm. Noted BP cuff on L arm-- removed. Pink arm band in place. Pt teaching reviewed to protect L arm.

## 2021-12-29 NOTE — TOC Progression Note (Signed)
Transition of Care [facility]) - Progression Note    Patient Details  Name: ABYGAIL GALENO MRN: [id number] Date of Birth: 08/03/1951  Transition of Care [facility]) CM/SW [place], LCSW Phone Number: 12/29/2021, 1:35 PM  Clinical Narrative:    CSW received call from [facility] that they are not able to accept patient due to the [facility]. [facility] is able to accept the patient and will begin insurance authorization. CSW updated patient's daughter, [name], and she reported agreement with [place].    Expected Discharge Plan: [place] Barriers to Discharge: Continued Medical Work up, Ship broker, SNF Pending bed offer  Expected Discharge Plan and Services Expected Discharge Plan: [place] In-house Referral: Clinical Social Work   Post Acute Care Choice: [place] Living arrangements for the past 2 months: Single Family Home                                       Social Determinants of Health (SDOH) Interventions    Readmission Risk Interventions No flowsheet data found.

## 2021-12-29 NOTE — NC FL2 (Signed)
[place] LEVEL OF CARE SCREENING TOOL     IDENTIFICATION  Patient Name: Diamond [name] Birthdate: [birth year] Sex: female Admission Date (Current Location): 12/16/2021  [facility] and [place] Number:  Herbalist and Address:  [facility] [place] [address], [place], [place] [address]      Provider Number: [id number]  Attending Physician Name and Address:  Jonetta Osgood, MD  Relative Name and Phone Number:  Marty, Sadlowski)   [phone]    Current Level of Care: Hospital Recommended Level of Care: [place] Prior Approval Number:    Date Approved/Denied:   PASRR Number: [id number]  Discharge Plan: SNF    Current Diagnoses: Patient Active Problem List   Diagnosis Date Noted   Hypotension 12/27/2021   Bleeding from dialysis catheter site 12/26/2021   Atrial flutter with rapid ventricular response-CHA2DS2-VASc 4 12/25/2021   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 seen on TEE on 1/17 12/24/2021   Lung nodule seen on imaging study 12/23/2021   MRSA bacteremia    Vertebral osteomyelitis ([place])    Leukocytosis 12/17/2021   Severe sepsis ([place]) due to MRSA bacteremia, L5-S1 discitis and endocarditis 12/17/2021   Lactic acidosis 12/17/2021   Low back pain 12/17/2021   ESRD (end stage renal disease) ([place]) 12/17/2021   Mild intermittent asthma 12/17/2021   Vitamin D deficiency 01/02/2021   Hyperparathyroidism ([place]) 12/31/2020   CKD (chronic kidney disease), stage III ([place]) 11/10/2019   Cardiomegaly 11/05/2019   Hyperkalemia 11/05/2019   Hypokalemia 10/18/2019   Pneumonia due to COVID-19 virus 10/17/2019   CAP (community acquired pneumonia) 10/16/2019   Hypertension    Asthma    Gout    AKI (acute kidney injury) ([place])    Hyponatremia    Multifactorial anemia-acute blood loss from bleeding HD catheter site-superimposed on anemia related to ESRD.     Orientation RESPIRATION BLADDER Height &  Weight     Self, Situation, Place  Normal Incontinent, External catheter Weight: 151 lb 14.4 oz (68.9 kg) Height:  _0  (170.2 cm)  BEHAVIORAL SYMPTOMS/MOOD NEUROLOGICAL BOWEL NUTRITION STATUS      Incontinent Diet (see dc summary)  AMBULATORY STATUS COMMUNICATION OF NEEDS Skin   Extensive Assist Verbally Surgical wounds (Closed incision on arm and chest)                       Personal Care Assistance Level of Assistance  Bathing, Feeding, Dressing Bathing Assistance: Maximum assistance Feeding assistance: Independent Dressing Assistance: Maximum assistance     Functional Limitations Info  Sight, Hearing Sight Info: Impaired Hearing Info: Impaired      SPECIAL CARE FACTORS FREQUENCY  PT (By licensed PT), OT (By licensed OT)     PT Frequency: 5X week OT Frequency: 5X week            Contractures Contractures Info: Not present    Additional Factors Info  Code Status, Allergies Code Status Info: Full Allergies Info: NKA     Isolation Precautions Info: MRSA     Current Medications (12/29/2021):  This is the current hospital active medication list Current Facility-Administered Medications  Medication Dose Route Frequency Provider Last Rate Last Admin   0.9 %  sodium chloride infusion (Manually program via Guardrails IV Fluids)   Intravenous Once Ejigiri, Thomos Lemons, PA-C       acetaminophen (TYLENOL) tablet 650 mg  650 mg Oral Q6H PRN Gabriel Earing, PA-C   650 mg at 12/22/21  1714   Or   acetaminophen (TYLENOL) suppository 650 mg  650 mg Rectal Q6H PRN Rhyne, Samantha J, PA-C       albuterol (PROVENTIL) (2.5 MG/3ML) 0.083% nebulizer solution 2.5 mg  2.5 mg Inhalation Q4H PRN Rhyne, Samantha J, PA-C       apixaban Arne [place]) Education Kit for DVT/PE patients   Does not apply Once Wendee Beavers, RPH       Chlorhexidine Gluconate Cloth 2 % PADS 6 each  6 each Topical Q0600 Lynnda Child, PA-C       [START ON 12/31/2021] Darbepoetin Alfa (ARANESP)  injection 60 mcg  60 mcg Intravenous Q Thu-HD Tobie Poet E, NP       heparin ADULT infusion 100 units/mL (25000 units/216m)  1,200 Units/hr Intravenous Continuous MKris Mouton RPH 12 mL/hr at 12/29/21 0400 1,200 Units/hr at 12/29/21 0400   heparin sodium (porcine) injection 3,200 Units  3,200 Units Intravenous Q M,W,F-HD AAdelfa Koh NP   3,200 Units at 12/26/21 1618   hydrALAZINE (APRESOLINE) injection 5 mg  5 mg Intravenous Q4H PRN RShela Leff MD       hydrALAZINE (APRESOLINE) tablet 25 mg  25 mg Oral Q8H Ghimire, Shanker M, MD   25 mg at 12/29/21 0645   metoprolol tartrate (LOPRESSOR) tablet 50 mg  50 mg Oral BID GJonetta Osgood MD   50 mg at 12/29/21 0915   naloxone (NARCAN) injection 0.4 mg  0.4 mg Intravenous PRN Rhyne, Samantha J, PA-C       oxyCODONE (Oxy IR/ROXICODONE) immediate release tablet 5 mg  5 mg Oral Q4H PRN Rhyne, Samantha J, PA-C   5 mg at 12/29/21 0332   polyethylene glycol (MIRALAX / GLYCOLAX) packet 17 g  17 g Oral Daily PRN Rhyne, Samantha J, PA-C       polyethylene glycol (MIRALAX / GLYCOLAX) packet 34 g  34 g Oral Daily Rhyne, Samantha J, PA-C   34 g at 12/29/21 0915   senna-docusate (Senokot-S) tablet 2 tablet  2 tablet Oral BID Rhyne,Hulen Shouts PA-C   2 tablet at 12/27/21 2225   sevelamer carbonate (RENVELA) tablet 1,600 mg  1,600 mg Oral TID WC ATobie PoetE, NP   1,600 mg at 12/29/21 0849   vancomycin (VANCOREADY) IVPB 500 mg/100 mL  500 mg Intravenous Q T,Th,Sa-HD CWendee Beavers RPH 100 mL/hr at 12/24/21 1736 500 mg at 12/24/21 1736     Discharge Medications: Please see discharge summary for a list of discharge medications.  Relevant Imaging Results:  Relevant Lab Results:   Additional Information SSN [id number] vaccine 07/12/20, 08/04/20. [facility] Arrives at 10:45am  NBenard Halsted LCSW

## 2021-12-29 NOTE — Progress Notes (Signed)
Physical Therapy Treatment Patient Details Name: Diamond [name] MRN: [id number] DOB: 1951/05/05 Today's Date: 12/29/2021   History of Present Illness Pt is a 71 y.o. F who presents 12/16/2021 with severe sepsis after presenting from home complaining of right sided back pain. Blood culture showing MRSA. 2D echo significant for possible mitral valve vegetation. Lumbar/thoracic spine MRI significant for discitis/osteomyelitis. Significant PMH: ESRD, asthma, HTN.    PT Comments    Patient seen for bed level exercises due to Hgb drop from 8.9 to 7.1 and pt awaiting 2 UPRBCs. Patient agreeable. Patient required multi-modal cues due to some confusion with technique of exercises. See below for specifics. Patient will benefit from continued therapy at SNF level of care.      Recommendations for follow up therapy are one component of a multi-disciplinary discharge planning process, led by the attending physician.  Recommendations may be updated based on patient status, additional functional criteria and insurance authorization.  Follow Up Recommendations  Skilled nursing-short term rehab (<3 hours/day)     Assistance Recommended at Discharge Frequent or constant Supervision/Assistance  Patient can return home with the following Two people to help with walking and/or transfers;A lot of help with bathing/dressing/bathroom;Assistance with cooking/housework;Direct supervision/assist for medications management;Direct supervision/assist for financial management;Assist for transportation;Help with stairs or ramp for entrance   Equipment Recommendations  BSC/3in1;Wheelchair (measurements PT);Wheelchair cushion (measurements PT);Hospital bed    Recommendations for Other Services       Precautions / Restrictions Precautions Precautions: Fall Precaution Comments: SVT monitor HR Restrictions Weight Bearing Restrictions: No     Mobility  Bed Mobility               General bed mobility comments:  pt seen for bedlevel exercises due to low Hgb (8.9>7.1 overnight with plan for 2U pRBCs)    Transfers                        Ambulation/Gait                   Stairs             Wheelchair Mobility    Modified Rankin (Stroke Patients Only)       Balance                                            Cognition Arousal/Alertness: Awake/alert Behavior During Therapy: Flat affect Overall Cognitive Status: Impaired/Different from baseline Area of Impairment: Problem solving, Awareness, Safety/judgement, Following commands, Memory, Attention, Orientation                 Orientation Level:  (NT) Current Attention Level: Sustained Memory: Decreased short-term memory Following Commands: Follows one step commands inconsistently, Follows one step commands with increased time Safety/Judgement: Decreased awareness of safety, Decreased awareness of deficits Awareness: Intellectual Problem Solving: Slow processing, Decreased initiation, Requires verbal cues, Requires tactile cues General Comments: Required verbal and tactile cues to understand heelslide exercise (multiple cues attempted with continued difficulty)        Exercises General Exercises - Lower Extremity Ankle Circles/Pumps: AROM, Both, 10 reps Gluteal Sets: AROM, Both, 10 reps Short Arc Quad: AROM, Both, 10 reps Heel Slides: AAROM, Both, 5 reps, Strengthening (assisted flexion and resisted extension)    General Comments        Pertinent Vitals/Pain Pain Assessment Pain Assessment: Faces Faces Pain  Scale: Hurts a little bit Pain Location: generalized/back with bed mobility Pain Descriptors / Indicators: Sore Pain Intervention(s): Limited activity within patient's tolerance, Monitored during session    Home Living                          Prior Function            PT Goals (current goals can now be found in the care plan section) Acute Rehab PT  Goals Patient Stated Goal: go to bed Time For Goal Achievement: 01/03/22 Potential to Achieve Goals: Good Progress towards PT goals: Progressing toward goals (participated with exercises)    Frequency    Min 2X/week      PT Plan Current plan remains appropriate    Co-evaluation              AM-PAC PT "6 Clicks" Mobility   Outcome Measure  Help needed turning from your back to your side while in a flat bed without using bedrails?: A Lot Help needed moving from lying on your back to sitting on the side of a flat bed without using bedrails?: A Lot Help needed moving to and from a bed to a chair (including a wheelchair)?: Total Help needed standing up from a chair using your arms (e.g., wheelchair or bedside chair)?: Total Help needed to walk in hospital room?: Total Help needed climbing 3-5 steps with a railing? : Total 6 Click Score: 8    End of Session   Activity Tolerance: Patient limited by fatigue;Treatment limited secondary to medical complications (Comment) (low HGB) Patient left: in bed;with call bell/phone within reach;with bed alarm set   PT Visit Diagnosis: Muscle weakness (generalized) (M62.81);Difficulty in walking, not elsewhere classified (R26.2);Pain Pain - part of body:  (back)     Time: 7416-3845 PT Time Calculation (min) (ACUTE ONLY): 12 min  Charges:  $Therapeutic Exercise: 8-22 mins                      Arby Barrette, PT Acute Rehabilitation Services  Pager [phone] Office [phone]    Rexanne Mano 12/29/2021, 2:10 PM

## 2021-12-29 NOTE — Progress Notes (Signed)
Patient refused breakfast this morning. Patient encouraged by nurse tech and RN to eat breakfast. Patient made little to no effort to consuming breakfast.

## 2021-12-29 NOTE — Progress Notes (Addendum)
[facility] for Apixaban>heparin and vanc Indication: RA thrombus/osteo  No Known Allergies  Patient Measurements: Height: 5\' 7"  (170.2 cm) Weight: 65 kg (143 lb 4.8 oz) IBW/kg (Calculated) : 61.6 Heparin dosing weight: 64.2 kg   Vital Signs: Temp: 98.5 F (36.9 C) (01/24 2026) Temp Source: Oral (01/24 2026) BP: 160/102 (01/24 2026) Pulse Rate: 91 (01/24 2026)  Labs: Recent Labs    12/27/21 0125 12/27/21 1053 12/28/21 0702 12/29/21 0129 12/29/21 0507 12/29/21 2153  HGB 8.4*  --  8.9* 7.1*  --   --   HCT 23.7*  --  25.9* 21.1*  --   --   PLT 150  --  186 144*  --   --   APTT  --    < > 77*  --  64* 67*  HEPARINUNFRC  --    < > >1.10*  --  0.82* 0.79*  CREATININE 4.92*  --  6.72*  --   --   --    < > = values in this interval not displayed.     Estimated Creatinine Clearance: 7.4 mL/min (A) (by C-G formula based on SCr of 6.72 mg/dL (H)).   Medical History: Past Medical History:  Diagnosis Date   Asthma    Atrial flutter with rapid ventricular response ([place]) 12/25/2021   Chronic kidney disease    dialysis Tues Thurs Sat   Dysrhythmia    PAF 10/2019 in setting of COVID-19 PNA   Gout    Hypertension      Assessment: 70 y.o female  with history of ESRD on HD TTS-presenting on 12/16/21 with sepsis due to MRSA bacteremia. A right atrial/IVC globular mobile mass compatible with thrombotic material/vegetative material seen on TEE on 1/17. Dr. Sloan Leiter  discussed this with cardiologist-Dr. Marlou Porch who -probably a thrombus related to HD catheter-recommends at least 6 months of anticoagulation.   Pharmacy consulted to start Apixaban for right atrial thrombus. She was not on anticoagulation prior to admission. Copay $45 for 30 day supply  Patient has been bleeding from catheter site after starting apixaban and vascular consulted. Unable to stop bleeding with pressure and rapid response called. Dr. Scot Dock placed a suture to stop the  bleeding and applied Dermabond. Apixaban  switched to heparin since it has a quicker on/off time. Because of RA thrombus and risk of developing a PE, still need full dose anticoagulation.  PM update - aPTT therapeutic at 67. Heparin level still elevated due to apixaban. Hg down to 7.1, plt down to 144. No bleed issues reported.  Goal of Therapy:  PTT 66-102 Heparin level - 0.3-0.7 Monitor platelets by anticoagulation protocol: Yes   Plan:  Continue heparin at 1250 units/hr Check confirmatory heparin level with AM labs Monitor daily heparin level and CBC, s/sx bleeding   Arturo Morton, PharmD, BCPS Please check AMION for all [facility] contact numbers Clinical Pharmacist 12/29/2021 10:54 PM

## 2021-12-29 NOTE — Progress Notes (Addendum)
[facility] for Apixaban>heparin and vanc Indication: RA thrombus/osteo  No Known Allergies  Patient Measurements: Height: 5\' 7"  (170.2 cm) Weight: 68.9 kg (151 lb 14.4 oz) IBW/kg (Calculated) : 61.6 Heparin dosing weight: 64.2 kg   Vital Signs: Temp: 98.9 F (37.2 C) (01/24 0810) Temp Source: Oral (01/24 0810) BP: 168/96 (01/24 0810) Pulse Rate: 95 (01/24 0810)  Labs: Recent Labs    12/26/21 1530 12/27/21 0125 12/27/21 1053 12/27/21 1918 12/28/21 0702 12/29/21 0129 12/29/21 0507  HGB 7.0* 8.4*  --   --  8.9* 7.1*  --   HCT 20.1* 23.7*  --   --  25.9* 21.1*  --   PLT 138* 150  --   --  186 144*  --   APTT  --   --    < > 55* 77*  --  64*  HEPARINUNFRC  --   --   --  >1.10* >1.10*  --  0.82*  CREATININE 3.83* 4.92*  --   --  6.72*  --   --    < > = values in this interval not displayed.     Estimated Creatinine Clearance: 7.4 mL/min (A) (by C-G formula based on SCr of 6.72 mg/dL (H)).   Medical History: Past Medical History:  Diagnosis Date   Asthma    Atrial flutter with rapid ventricular response ([place]) 12/25/2021   Chronic kidney disease    dialysis Tues Thurs Sat   Dysrhythmia    PAF 10/2019 in setting of COVID-19 PNA   Gout    Hypertension      [name]  with history of ESRD on HD TTS-presenting on 12/16/21  with sepsis due to MRSA bacteremia. Currently on Vancomycin on HD days,  End date for Vanc is 02/13/22.  A right atrial/IVC globular mobile mass compatible with thrombotic material/vegetative material seen on TEE on 1/17. Dr. Sloan Leiter  discussed this with cardiologist-Dr. Marlou Porch who -probably a thrombus related to HD catheter-recommends at least 6 months of anticoagulation.   Pharmacy consulted to start Apixaban for right atrial thrombus.  She was not on anticoagulation prior to admission.  Copay $45 for 30 day supply  Patient has been bleeding from catheter site after starting apixaban and  vascular consulted. Unable to stop bleeding with pressure and rapid response called. Dr. Scot Dock placed a suture to stop the bleeding and applied Dermabond.Marland Kitchen Apixaban  switched to heparin since it has a quicker on/off time. Because of RA thrombus and risk of developing a PE, still need full dose anticoagulation.  PTT 64 so slightly subtherapeutic . HL still elevated 0.82 due to apixaban. Will increase slightly and check level again in AM.  PreHD vanc came back at 15 this AM. We will continue with the same dose.   Goal of Therapy:  PTT 66-102 Heparin level - 0.3-0.7 PreHD vanc 15-25 Monitor platelets by anticoagulation protocol: Yes   Plan:  Increase  heparin to 1250 units/hr Daily PTT, CBC, and HL Continue vanc 500mg  IV TTS  Onnie Boer, PharmD, BCIDP, AAHIVP, CPP Infectious Disease Pharmacist 12/29/2021 9:26 AM

## 2021-12-29 NOTE — Progress Notes (Signed)
PROGRESS NOTE        PATIENT DETAILS Name: Diamond [name] Age: 71 y.o. Sex: female Date of Birth: 02-02-1951 Admit Date: 12/16/2021 Admitting Physician Rhetta Mura, DO HQI:[id number], Iona Beard, MD  Brief Narrative: 71 year old female with history of ESRD on HD TTS-presenting with sepsis due to MRSA bacteremia.  Hospital course complicated by A. fib RVR, bleeding from HD catheter site on 1/21 causing hypotension and acute blood loss anemia.  See below for further details   Subjective: Lying comfortably in bed-denies any chest pain or shortness of breath.  Had a lot of questions regarding medical issues-SNF-I answered to the best of my ability.  No bleeding from HD catheter site.  Per RN-no melena/bloody stools.   Objective: Vitals: Blood pressure (!) 183/96, pulse 87, temperature (!) 100.5 F (38.1 C), temperature source Oral, resp. rate 16, height 5\' 7"  (1.702 m), weight 68.9 kg, SpO2 97 %.   Exam: Gen Exam:Alert awake-not in any distress HEENT:atraumatic, normocephalic Chest: B/L clear to auscultation anteriorly CVS:S1S2 regular Abdomen:soft non tender, non distended Extremities:no edema Neurology: Non focal Skin: no rash   Pertinent Labs/Radiology: CBC Latest Ref Rng & Units 12/29/2021 12/28/2021 12/27/2021  WBC 4.0 - 10.5 K/uL 8.6 11.2(H) 10.8(H)  Hemoglobin 12.0 - 15.0 g/dL 7.1(L) 8.9(L) 8.4(L)  Hematocrit 36.0 - 46.0 % 21.1(L) 25.9(L) 23.7(L)  Platelets 150 - 400 K/uL 144(L) 186 150    Lab Results  Component Value Date   NA 127 (L) 12/28/2021   K 4.3 12/28/2021   CL 89 (L) 12/28/2021   CO2 25 12/28/2021      Assessment/Plan: * Severe sepsis ([place]) due to MRSA bacteremia, L5-S1 discitis and endocarditis- (present on admission) Sepsis physiology has resolved-repeat blood cultures negative so far-  TEE on 1/17 without obvious valvular vegetations-but did have a right atrial/IVC globular mobile mass compatible with thrombotic/vegetative  material.  Reasonable to treat like endocarditis with embolic phenomena causing discitis.  ID recommending IV vancomycin-end date-02/13/2022.   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 seen on TEE on 1/17 Discussed with cardiologist-Dr. Marlou Porch on 1/18-probably a thrombus related to HD catheter-recommends at least 6 months of anticoagulation-however now with atrial flutter-will require indefinite anticoagulation.    After discussion with Dr. Hayden Rasmussen surgery on 1/19-full dose anticoagulation was started.  Unfortunately developed bleeding from HD catheter site on 1/21-anticoagulation was held-after hemostasis was obtained-patient was restarted on anticoagulation with IV heparin after discussion with Dr. Scot Dock on 1/22.  Plans are to resume Eliquis prior to discharge.   Atrial flutter with rapid ventricular response-CHA2DS2-VASc 4 Developed RVR on 1/20-initially started on Cardizem infusion-but then transition to amiodarone infusion by cardiology.  Maintaining NSR-no longer on amiodarone-has been transitioned to metoprolol.  Currently on IV heparin with plans to resume Eliquis when closer to discharge    Hypotension Occurred on 1/21-due to blood loss from bleeding HD catheter site.  Resolved with PRBC transfusion and supportive care.  BP now stable.  Bleeding from dialysis catheter site Occurred around 6:45 AM on 1/21-pressure held-eventually seen by vascular surgery-and sutured at bedside.  No further bleeding since then.   Multifactorial anemia-acute blood loss from bleeding HD catheter site-superimposed on anemia related to ESRD.- (present on admission) Due to worsening hemoglobin from HD catheter site bleeding-transfused 1 unit of PRBC on 01/21.  Hemoglobin dropped again today-we will transfuse PRBC with HD.  Nephrology following-defer Aranesp/IV iron  to nephrology service.  ESRD (end stage renal disease) ([place])- (present on admission) Due to bacteremia given line  holiday-dialysis catheter was removed on 1/14, on 1/18-vascular surgery placed right IJ TDC and left brachial AV fistula.  Nephrology following-defer HD care to nephrology.  Hyponatremia- (present on admission) Volume management with HD-follow.  Hypertension- (present on admission) BP elevated-have increased dosage of beta-blocker.  If continues to be elevated-we will add back hydralazine.  No longer on verapamil.    Mild intermittent asthma- (present on admission)  Stable-continue as needed bronchodilators.  Lung nodule seen on imaging study- (present on admission)  will need repeat CT chest in 66-month  BMI: Estimated body mass index is 23.79 kg/m as calculated from the following:   Height as of this encounter: 5\' 7"  (1.702 m).   Weight as of this encounter: 68.9 kg.   Procedures:  1/14>> HD catheter removed 1/17>> TEE 1/18>> right IJ tunnel catheter placement, left brachiocephalic for stage AV fistula creation   DVT Prophylaxis: Eliquis. Consults: Nephrology, vascular surgery, infectious disease, cardiology Code Status:Full code  Family Communication:Son Marchia Bond- [phone]-updated on 1/24   Disposition Plan: Status is: Inpatient  Remains inpatient appropriate because: MRSA bacteremia-on IV vancomycin-recommendations are for SNF on discharge    Diet: Diet Order             Diet renal with fluid restriction Fluid restriction: 2000 mL Fluid; Room service appropriate? Yes; Fluid consistency: Thin  Diet effective now                     Antimicrobial agents: Anti-infectives (From admission, onward)    Start     Dose/Rate Route Frequency Ordered Stop   12/28/21 1500  vancomycin (VANCOREADY) IVPB 500 mg/100 mL        500 mg 100 mL/hr over 60 Minutes Intravenous  Once 12/28/21 1245 12/28/21 1811   12/26/21 1200  vancomycin (VANCOREADY) IVPB 500 mg/100 mL  Status:  Discontinued        500 mg 100 mL/hr over 60 Minutes Intravenous Every T-Th-Sa (Hemodialysis)  12/24/21 1134 12/24/21 1158   12/24/21 1245  vancomycin (VANCOREADY) IVPB 500 mg/100 mL        500 mg 100 mL/hr over 60 Minutes Intravenous Every T-Th-Sa (Hemodialysis) 12/24/21 1158 02/13/22 2359   12/24/21 1230  vancomycin (VANCOREADY) IVPB 500 mg/100 mL  Status:  Discontinued        500 mg 100 mL/hr over 60 Minutes Intravenous  Once 12/24/21 1134 12/24/21 1147   12/23/21 1456  ceFAZolin (ANCEF) 2-4 GM/100ML-% IVPB  Status:  Discontinued       Note to Pharmacy: Gregery Na H: cabinet override      12/23/21 1456 12/23/21 1515   12/23/21 1200  vancomycin (VANCOREADY) IVPB 500 mg/100 mL  Status:  Discontinued        500 mg 100 mL/hr over 60 Minutes Intravenous  Once 12/22/21 1555 12/23/21 2216   12/22/21 1200  vancomycin (VANCOREADY) IVPB 500 mg/100 mL  Status:  Discontinued        500 mg 100 mL/hr over 60 Minutes Intravenous Every T-Th-Sa (Hemodialysis) 12/22/21 0842 12/24/21 1134   12/18/21 0945  vancomycin (VANCOREADY) IVPB 750 mg/150 mL        750 mg 150 mL/hr over 60 Minutes Intravenous  Once 12/18/21 0845 12/18/21 1138   12/17/21 2200  ceFEPIme (MAXIPIME) 1 g in sodium chloride 0.9 % 100 mL IVPB  Status:  Discontinued        1 g  200 mL/hr over 30 Minutes Intravenous Every 24 hours 12/17/21 0129 12/17/21 0326   12/17/21 1800  cefTRIAXone (ROCEPHIN) 1 g in sodium chloride 0.9 % 100 mL IVPB  Status:  Discontinued        1 g 200 mL/hr over 30 Minutes Intravenous Every 24 hours 12/17/21 0326 12/17/21 1631   12/17/21 1445  vancomycin (VANCOREADY) IVPB 750 mg/150 mL  Status:  Discontinued        750 mg 150 mL/hr over 60 Minutes Intravenous Every T-Th-Sa (Hemodialysis) 12/17/21 1432 12/22/21 0842   12/17/21 1400  metroNIDAZOLE (FLAGYL) tablet 500 mg  Status:  Discontinued        500 mg Oral Every 8 hours 12/17/21 1345 12/17/21 1631   12/17/21 1200  vancomycin (VANCOREADY) IVPB 750 mg/150 mL  Status:  Discontinued        750 mg 150 mL/hr over 60 Minutes Intravenous Every T-Th-Sa  (Hemodialysis) 12/17/21 0129 12/17/21 0326   12/17/21 0130  vancomycin (VANCOREADY) IVPB 1500 mg/300 mL        1,500 mg 150 mL/hr over 120 Minutes Intravenous  Once 12/17/21 0115 12/17/21 0537   12/17/21 0115  ceFEPIme (MAXIPIME) 2 g in sodium chloride 0.9 % 100 mL IVPB        2 g 200 mL/hr over 30 Minutes Intravenous  Once 12/17/21 0113 12/17/21 0315   12/17/21 0115  metroNIDAZOLE (FLAGYL) IVPB 500 mg        500 mg 100 mL/hr over 60 Minutes Intravenous  Once 12/17/21 0113 12/17/21 0526   12/17/21 0115  vancomycin (VANCOCIN) IVPB 1000 mg/200 mL premix  Status:  Discontinued        1,000 mg 200 mL/hr over 60 Minutes Intravenous  Once 12/17/21 0113 12/17/21 0115        MEDICATIONS: Scheduled Meds:  sodium chloride   Intravenous Once   sodium chloride   Intravenous Once   acetaminophen  650 mg Oral Once   apixaban   Does not apply Once   Chlorhexidine Gluconate Cloth  6 each Topical Q0600   [START ON 12/31/2021] darbepoetin (ARANESP) injection - DIALYSIS  60 mcg Intravenous Q Thu-HD   diphenhydrAMINE  25 mg Intravenous Once   furosemide  20 mg Intravenous Once   heparin sodium (porcine)  3,200 Units Intravenous Q M,W,F-HD   hydrALAZINE  25 mg Oral Q8H   metoprolol tartrate  50 mg Oral BID   polyethylene glycol  34 g Oral Daily   senna-docusate  2 tablet Oral BID   sevelamer carbonate  1,600 mg Oral TID WC   Continuous Infusions:  heparin 1,200 Units/hr (12/29/21 0400)   vancomycin 500 mg (12/24/21 1736)   PRN Meds:.acetaminophen **OR** acetaminophen, albuterol, hydrALAZINE, naLOXone (NARCAN)  injection, oxyCODONE, polyethylene glycol   I have personally reviewed following labs and imaging studies  LABORATORY DATA: CBC: Recent Labs  Lab 12/26/21 1036 12/26/21 1530 12/27/21 0125 12/28/21 0702 12/29/21 0129  WBC 12.2* 15.3* 10.8* 11.2* 8.6  HGB 10.3* 7.0* 8.4* 8.9* 7.1*  HCT 30.0* 20.1* 23.7* 25.9* 21.1*  MCV 93.2 91.8 89.1 91.2 93.8  PLT 172 138* 150 186 144*     Basic Metabolic Panel: Recent Labs  Lab 12/24/21 0508 12/26/21 0059 12/26/21 1530 12/27/21 0125 12/28/21 0702  NA 130* 126* 130* 128* 127*  K 4.3 3.6 3.6 4.0 4.3  CL 94* 88* 93* 91* 89*  CO2 18* 25 26 24 25   GLUCOSE 91 106* 101* 92 88  BUN 81* 43* 26* 35* 46*  CREATININE  8.86* 5.93* 3.83* 4.92* 6.72*  CALCIUM 9.3 10.0 8.1* 8.8* 8.9  PHOS 10.2* 7.3*  --  6.7* 7.5*    GFR: Estimated Creatinine Clearance: 7.4 mL/min (A) (by C-G formula based on SCr of 6.72 mg/dL (H)).  Liver Function Tests: Recent Labs  Lab 12/24/21 0508 12/26/21 0059 12/26/21 1530 12/27/21 0125 12/28/21 0702  AST  --   --  15  --   --   ALT  --   --  8  --   --   ALKPHOS  --   --  49  --   --   BILITOT  --   --  0.4  --   --   PROT  --   --  5.0*  --   --   ALBUMIN 2.1* 2.2* 1.6* 1.9* 2.1*   No results for input(s): LIPASE, AMYLASE in the last 168 hours. No results for input(s): AMMONIA in the last 168 hours.  Coagulation Profile: No results for input(s): INR, PROTIME in the last 168 hours.  Cardiac Enzymes: No results for input(s): CKTOTAL, CKMB, CKMBINDEX, TROPONINI in the last 168 hours.  BNP (last 3 results) No results for input(s): PROBNP in the last 8760 hours.  Lipid Profile: No results for input(s): CHOL, HDL, LDLCALC, TRIG, CHOLHDL, LDLDIRECT in the last 72 hours.  Thyroid Function Tests: No results for input(s): TSH, T4TOTAL, FREET4, T3FREE, THYROIDAB in the last 72 hours.  Anemia Panel: No results for input(s): VITAMINB12, FOLATE, FERRITIN, TIBC, IRON, RETICCTPCT in the last 72 hours.  Urine analysis:    Component Value Date/Time   COLORURINE STRAW (A) 10/01/2021 1234   APPEARANCEUR CLEAR 10/01/2021 1234   LABSPEC 1.009 10/01/2021 1234   PHURINE 7.0 10/01/2021 1234   GLUCOSEU 50 (A) 10/01/2021 1234   HGBUR NEGATIVE 10/01/2021 1234   BILIRUBINUR NEGATIVE 10/01/2021 1234   KETONESUR NEGATIVE 10/01/2021 1234   PROTEINUR 100 (A) 10/01/2021 1234   UROBILINOGEN 0.2  09/27/2021 1329   NITRITE NEGATIVE 10/01/2021 1234   LEUKOCYTESUR TRACE (A) 10/01/2021 1234    Sepsis Labs: Lactic Acid, Venous    Component Value Date/Time   LATICACIDVEN 1.6 12/26/2021 1530    MICROBIOLOGY: Recent Results (from the past 240 hour(s))  Resp Panel by RT-PCR (Flu A&B, Covid) Nasopharyngeal Swab     Status: None   Collection Time: 12/28/21  3:40 PM   Specimen: Nasopharyngeal Swab; Nasopharyngeal(NP) swabs in vial transport medium  Result Value Ref Range Status   SARS Coronavirus 2 by RT PCR NEGATIVE NEGATIVE Final    Comment: (NOTE) SARS-CoV-2 target nucleic acids are NOT DETECTED.  The SARS-CoV-2 RNA is generally detectable in upper respiratory specimens during the acute phase of infection. The lowest concentration of SARS-CoV-2 viral copies this assay can detect is 138 copies/mL. A negative result does not preclude SARS-Cov-2 infection and should not be used as the sole basis for treatment or other patient management decisions. A negative result may occur with  improper specimen collection/handling, submission of specimen other than nasopharyngeal swab, presence of viral mutation(s) within the areas targeted by this assay, and inadequate number of viral copies(<138 copies/mL). A negative result must be combined with clinical observations, patient history, and epidemiological information. The expected result is Negative.  Fact Sheet for Patients:  EntrepreneurPulse.com.au  Fact Sheet for Healthcare Providers:  IncredibleEmployment.be  This test is no t yet approved or cleared by the [place] FDA and  has been authorized for detection and/or diagnosis of SARS-CoV-2 by FDA under an Emergency Use  Authorization (EUA). This EUA will remain  in effect (meaning this test can be used) for the duration of the COVID-19 declaration under Section 564(b)(1) of the Act, 21 U.S.C.section 360bbb-3(b)(1), unless the authorization is  terminated  or revoked sooner.       Influenza A by PCR NEGATIVE NEGATIVE Final   Influenza B by PCR NEGATIVE NEGATIVE Final    Comment: (NOTE) The Xpert Xpress SARS-CoV-2/FLU/RSV plus assay is intended as an aid in the diagnosis of influenza from Nasopharyngeal swab specimens and should not be used as a sole basis for treatment. Nasal washings and aspirates are unacceptable for Xpert Xpress SARS-CoV-2/FLU/RSV testing.  Fact Sheet for Patients: EntrepreneurPulse.com.au  Fact Sheet for Healthcare Providers: IncredibleEmployment.be  This test is not yet approved or cleared by the [place] FDA and has been authorized for detection and/or diagnosis of SARS-CoV-2 by FDA under an Emergency Use Authorization (EUA). This EUA will remain in effect (meaning this test can be used) for the duration of the COVID-19 declaration under Section 564(b)(1) of the Act, 21 U.S.C. section 360bbb-3(b)(1), unless the authorization is terminated or revoked.  Performed at [facility], [place] [address] [place], [place] [address]     RADIOLOGY STUDIES/RESULTS: No results found.   LOS: 12 days   Oren Binet, MD  [facility]    To contact the attending provider between 7A-7P or the covering provider during after hours 7P-7A, please log into the web site [email or web address] and access using universal [place] password for that web site. If you do not have the password, please call the hospital operator.  12/29/2021, 12:23 PM

## 2021-12-30 DIAGNOSIS — R6520 Severe sepsis without septic shock: Secondary | ICD-10-CM | POA: Diagnosis not present

## 2021-12-30 DIAGNOSIS — N2581 Secondary hyperparathyroidism of renal origin: Secondary | ICD-10-CM | POA: Diagnosis not present

## 2021-12-30 DIAGNOSIS — N186 End stage renal disease: Secondary | ICD-10-CM | POA: Diagnosis not present

## 2021-12-30 DIAGNOSIS — T82838S Hemorrhage of vascular prosthetic devices, implants and grafts, sequela: Secondary | ICD-10-CM

## 2021-12-30 DIAGNOSIS — I480 Paroxysmal atrial fibrillation: Secondary | ICD-10-CM | POA: Diagnosis not present

## 2021-12-30 DIAGNOSIS — Z992 Dependence on renal dialysis: Secondary | ICD-10-CM | POA: Diagnosis not present

## 2021-12-30 DIAGNOSIS — Z7401 Bed confinement status: Secondary | ICD-10-CM | POA: Diagnosis not present

## 2021-12-30 DIAGNOSIS — J45998 Other asthma: Secondary | ICD-10-CM | POA: Diagnosis not present

## 2021-12-30 DIAGNOSIS — A419 Sepsis, unspecified organism: Secondary | ICD-10-CM | POA: Diagnosis not present

## 2021-12-30 DIAGNOSIS — D631 Anemia in chronic kidney disease: Secondary | ICD-10-CM | POA: Diagnosis not present

## 2021-12-30 DIAGNOSIS — I330 Acute and subacute infective endocarditis: Secondary | ICD-10-CM | POA: Diagnosis not present

## 2021-12-30 DIAGNOSIS — B9562 Methicillin resistant Staphylococcus aureus infection as the cause of diseases classified elsewhere: Secondary | ICD-10-CM | POA: Diagnosis not present

## 2021-12-30 DIAGNOSIS — I318 Other specified diseases of pericardium: Secondary | ICD-10-CM | POA: Diagnosis not present

## 2021-12-30 DIAGNOSIS — M2550 Pain in unspecified joint: Secondary | ICD-10-CM | POA: Diagnosis not present

## 2021-12-30 DIAGNOSIS — I4892 Unspecified atrial flutter: Secondary | ICD-10-CM | POA: Diagnosis not present

## 2021-12-30 DIAGNOSIS — I120 Hypertensive chronic kidney disease with stage 5 chronic kidney disease or end stage renal disease: Secondary | ICD-10-CM | POA: Diagnosis not present

## 2021-12-30 DIAGNOSIS — I513 Intracardiac thrombosis, not elsewhere classified: Secondary | ICD-10-CM | POA: Diagnosis not present

## 2021-12-30 DIAGNOSIS — I4891 Unspecified atrial fibrillation: Secondary | ICD-10-CM | POA: Diagnosis not present

## 2021-12-30 DIAGNOSIS — I499 Cardiac arrhythmia, unspecified: Secondary | ICD-10-CM | POA: Diagnosis not present

## 2021-12-30 DIAGNOSIS — N250 Renal osteodystrophy: Secondary | ICD-10-CM | POA: Diagnosis not present

## 2021-12-30 DIAGNOSIS — B958 Unspecified staphylococcus as the cause of diseases classified elsewhere: Secondary | ICD-10-CM | POA: Diagnosis not present

## 2021-12-30 DIAGNOSIS — I10 Essential (primary) hypertension: Secondary | ICD-10-CM | POA: Diagnosis not present

## 2021-12-30 DIAGNOSIS — M109 Gout, unspecified: Secondary | ICD-10-CM | POA: Diagnosis not present

## 2021-12-30 DIAGNOSIS — M4620 Osteomyelitis of vertebra, site unspecified: Secondary | ICD-10-CM | POA: Diagnosis not present

## 2021-12-30 DIAGNOSIS — A4102 Sepsis due to Methicillin resistant Staphylococcus aureus: Secondary | ICD-10-CM | POA: Diagnosis not present

## 2021-12-30 DIAGNOSIS — D638 Anemia in other chronic diseases classified elsewhere: Secondary | ICD-10-CM | POA: Diagnosis not present

## 2021-12-30 DIAGNOSIS — M4646 Discitis, unspecified, lumbar region: Secondary | ICD-10-CM | POA: Diagnosis not present

## 2021-12-30 DIAGNOSIS — R7881 Bacteremia: Secondary | ICD-10-CM | POA: Diagnosis not present

## 2021-12-30 LAB — BPAM RBC
Blood Product Expiration Date: 202302162359
Blood Product Expiration Date: 202302162359
ISSUE DATE / TIME: 202301241706
ISSUE DATE / TIME: 202301241818
Unit Type and Rh: 5100
Unit Type and Rh: 5100

## 2021-12-30 LAB — APTT: aPTT: 71 seconds — ABNORMAL HIGH (ref 24–36)

## 2021-12-30 LAB — CBC
HCT: 30.7 % — ABNORMAL LOW (ref 36.0–46.0)
Hemoglobin: 10.6 g/dL — ABNORMAL LOW (ref 12.0–15.0)
MCH: 31.2 pg (ref 26.0–34.0)
MCHC: 34.5 g/dL (ref 30.0–36.0)
MCV: 90.3 fL (ref 80.0–100.0)
Platelets: 163 10*3/uL (ref 150–400)
RBC: 3.4 MIL/uL — ABNORMAL LOW (ref 3.87–5.11)
RDW: 16.7 % — ABNORMAL HIGH (ref 11.5–15.5)
WBC: 9.6 10*3/uL (ref 4.0–10.5)
nRBC: 0 % (ref 0.0–0.2)

## 2021-12-30 LAB — TYPE AND SCREEN
ABO/RH(D): O POS
Antibody Screen: NEGATIVE
Unit division: 0
Unit division: 0

## 2021-12-30 LAB — HEPARIN LEVEL (UNFRACTIONATED): Heparin Unfractionated: 0.61 IU/mL (ref 0.30–0.70)

## 2021-12-30 MED ORDER — METOPROLOL TARTRATE 50 MG PO TABS: 50.0000 mg | ORAL_TABLET | Freq: Two times a day (BID) | ORAL | Status: DC

## 2021-12-30 MED ORDER — ACETAMINOPHEN 325 MG PO TABS: 650.0000 mg | ORAL_TABLET | Freq: Four times a day (QID) | ORAL | Status: DC | PRN

## 2021-12-30 MED ORDER — APIXABAN 5 MG PO TABS: 5.0000 mg | ORAL_TABLET | Freq: Two times a day (BID) | ORAL | Status: DC

## 2021-12-30 MED ORDER — SEVELAMER CARBONATE 800 MG PO TABS: 1600.0000 mg | ORAL_TABLET | Freq: Three times a day (TID) | ORAL | Status: DC

## 2021-12-30 MED ORDER — POLYETHYLENE GLYCOL 3350 17 G PO PACK: 17.0000 g | PACK | Freq: Every day | ORAL | 0 refills | Status: DC | PRN

## 2021-12-30 MED ORDER — VANCOMYCIN HCL 500 MG/100ML IV SOLN: 500.0000 mg | INTRAVENOUS | Status: DC

## 2021-12-30 MED ORDER — LIDOCAINE 5 % EX PTCH: 1.0000 | MEDICATED_PATCH | Freq: Every day | CUTANEOUS | 0 refills | Status: DC

## 2021-12-30 MED ORDER — HYDRALAZINE HCL 50 MG PO TABS
50.0000 mg | ORAL_TABLET | Freq: Three times a day (TID) | ORAL | Status: DC
  Administered 2021-12-30: 13:00:00 50 mg via ORAL
  Filled 2021-12-30: qty 1

## 2021-12-30 MED ORDER — APIXABAN 5 MG PO TABS
5.0000 mg | ORAL_TABLET | Freq: Two times a day (BID) | ORAL | Status: DC
  Administered 2021-12-30: 10:00:00 5 mg via ORAL
  Filled 2021-12-30: qty 1

## 2021-12-30 MED ORDER — SENNOSIDES-DOCUSATE SODIUM 8.6-50 MG PO TABS: 2.0000 | ORAL_TABLET | Freq: Two times a day (BID) | ORAL | Status: DC

## 2021-12-30 MED ORDER — HYDRALAZINE HCL 25 MG PO TABS: 25.0000 mg | ORAL_TABLET | Freq: Three times a day (TID) | ORAL | Status: DC

## 2021-12-30 MED ORDER — OXYCODONE HCL 5 MG PO TABS: 5.0000 mg | ORAL_TABLET | Freq: Four times a day (QID) | ORAL | 0 refills | Status: DC | PRN

## 2021-12-30 MED ORDER — HYDRALAZINE HCL 50 MG PO TABS: 50.0000 mg | ORAL_TABLET | Freq: Three times a day (TID) | ORAL | Status: DC

## 2021-12-30 NOTE — TOC Transition Note (Addendum)
Transition of Care [facility]) - CM/SW Discharge Note   Patient Details  Name: MESHAWN OCONNOR MRN: [id number] Date of Birth: 1951-09-30  [facility] CM/SW Contact:  Benard Halsted, LCSW Phone Number: 12/30/2021, 12:23 PM   Clinical Narrative:    Patient will DC to: [facility] Anticipated DC date: 12/30/21 Family notified: [name] and Marchia Bond Transport by: Corey Harold   Per MD [facility] able to [facility] facilities on the insurance authorization approval). RN to call report prior to discharge [phone]). RN, patient, patient's family, and facility notified of DC. Discharge Summary and FL2 sent to facility. DC packet on chart. Ambulance transport requested for patient.   CSW will sign off for now as social work intervention is no longer needed. Please consult [place] again if new needs arise.     Final next level of care: Skilled Nursing Facility Barriers to Discharge: Barriers Resolved   Patient Goals and CMS Choice Patient states their goals for this hospitalization and ongoing recovery are:: Rehab CMS Medicare.gov Compare Post Acute Care list provided to:: Patient Choice offered to / list presented to : Patient  Discharge Placement   Existing PASRR number confirmed : 12/30/21          Patient [facility] bed at: [facility] Patient to be transferred to facility by: [facility] Name of family member notified: Daughter, son Patient and family notified of of transfer: 12/30/21  Discharge Plan and Services In-house Referral: Clinical Social Work   Post Acute Care Choice: [place]                               Social Determinants of Health (SDOH) Interventions     Readmission Risk Interventions No flowsheet data found.

## 2021-12-30 NOTE — Progress Notes (Signed)
Nsg Discharge Note  Admit Date:  12/16/2021 Discharge date: 12/30/2021   Diamond [name] to be D/C'd Home per MD order.  AVS completed.     Discharge Medication: Allergies as of 12/30/2021   No Known Allergies      Medication List     STOP taking these medications    diclofenac Sodium 1 % Gel Commonly known as: Voltaren   predniSONE 10 MG tablet Commonly known as: DELTASONE   traMADol 50 MG tablet Commonly known as: ULTRAM   verapamil 120 MG tablet Commonly known as: CALAN       TAKE these medications    acetaminophen 325 MG tablet Commonly known as: TYLENOL Take 2 tablets (650 mg total) by mouth every 6 (six) hours as needed for mild pain (or Fever >/= 101).   albuterol 108 (90 Base) MCG/ACT inhaler Commonly known as: VENTOLIN HFA Inhale 2 puffs into the lungs every 4 (four) hours as needed for wheezing or shortness of breath.   allopurinol 100 MG tablet Commonly known as: ZYLOPRIM Take 200 mg by mouth daily.   apixaban 5 MG Tabs tablet Commonly known as: ELIQUIS Take 1 tablet (5 mg total) by mouth 2 (two) times daily.   colchicine 0.6 MG tablet Take 0.6 mg by mouth daily as needed (gout).   hydrALAZINE 50 MG tablet Commonly known as: APRESOLINE Take 1 tablet (50 mg total) by mouth every 8 (eight) hours.   lidocaine 5 % Commonly known as: LIDODERM Place 1 patch onto the skin daily before breakfast. Remove & Discard patch within 12 hours or as directed by MD Start taking on: December 31, 2021   metoprolol tartrate 50 MG tablet Commonly known as: LOPRESSOR Take 1 tablet (50 mg total) by mouth 2 (two) times daily.   ondansetron 4 MG tablet Commonly known as: ZOFRAN Take 4 mg by mouth every 8 (eight) hours as needed.   oxyCODONE 5 MG immediate release tablet Commonly known as: Oxy IR/ROXICODONE Take 1 tablet (5 mg total) by mouth every 6 (six) hours as needed for severe pain.   polyethylene glycol 17 g packet Commonly known as: MIRALAX /  GLYCOLAX Take 17 g by mouth daily as needed for mild constipation.   senna-docusate 8.6-50 MG tablet Commonly known as: Senokot-S Take 2 tablets by mouth 2 (two) times daily.   sevelamer carbonate 800 MG tablet Commonly known as: RENVELA Take 2 tablets (1,600 mg total) by mouth 3 (three) times daily with meals.   vancomycin 500 MG/100ML IVPB Commonly known as: VANCOREADY Inject 100 mLs (500 mg total) into the vein Every Tuesday,Thursday,and Saturday with dialysis. Start taking on: December 31, 2021        Discharge Assessment: Vitals:   12/30/21 1223 12/30/21 1242  BP: (!) 146/119 (!) 161/99  Pulse: 93 91  Resp: 20 20  Temp:    SpO2: 96%    Skin clean, dry and intact without evidence of skin break down, no evidence of skin tears noted. IV catheter discontinued intact. Site without signs and symptoms of complications - no redness or edema noted at insertion site, patient denies c/o pain - only slight tenderness at site.  Dressing with slight pressure applied.  D/c Instructions-Education: Discharge instructions given to patient/family with verbalized understanding. D/c education completed with patient/family including follow up instructions, medication list, d/c activities limitations if indicated, with other d/c instructions as indicated by MD - patient able to verbalize understanding, all questions fully answered. Patient instructed to return to ED, call  911, or call MD for any changes in condition.  Patient escorted via stretcher by [facility].  Hiram Comber, RN 12/30/2021 4:25 PM

## 2021-12-30 NOTE — Progress Notes (Signed)
[facility] Progress Note   Subjective:    Seen in room. Up in recliner, eating breakfast.    Objective Vitals:   12/29/21 2026 12/30/21 0021 12/30/21 0113 12/30/21 0808  BP: (!) 160/102 (!) 198/108 (!) 150/91 (!) 154/90  Pulse: 91 97 100 89  Resp: 17 20 19 16   Temp: 98.5 F (36.9 C) 97.8 F (36.6 C)  99.2 F (37.3 C)  TempSrc: Oral Oral  Oral  SpO2: (!) 81% 98% 100% 97%  Weight:      Height:       Physical Exam General: Lying in bed; NAD Heart: Normal S1 and S2; No murmurs, gallops, or rubs Lungs: Clear throughout; No wheezing, rales, or rhonchi Abdomen: Soft and non-tender Extremities: No edema BLLE Dialysis Access: R IJ TDC (new)-no bleeding noted; dressing clean, dry, and intact; L AVF (+) Bruit/Thrill (new)   Filed Weights   12/28/21 1105 12/29/21 0342 12/29/21 1617  Weight: 64.1 kg 68.9 kg 65 kg    Intake/Output Summary (Last 24 hours) at 12/30/2021 1139 Last data filed at 12/29/2021 1858 Gross per 24 hour  Intake 711.22 ml  Output 1500 ml  Net -788.78 ml     Additional Objective Labs: Basic Metabolic Panel: Recent Labs  Lab 12/26/21 0059 12/26/21 1530 12/27/21 0125 12/28/21 0702  NA 126* 130* 128* 127*  K 3.6 3.6 4.0 4.3  CL 88* 93* 91* 89*  CO2 25 26 24 25   GLUCOSE 106* 101* 92 88  BUN 43* 26* 35* 46*  CREATININE 5.93* 3.83* 4.92* 6.72*  CALCIUM 10.0 8.1* 8.8* 8.9  PHOS 7.3*  --  6.7* 7.5*    Liver Function Tests: Recent Labs  Lab 12/26/21 1530 12/27/21 0125 12/28/21 0702  AST 15  --   --   ALT 8  --   --   ALKPHOS 49  --   --   BILITOT 0.4  --   --   PROT 5.0*  --   --   ALBUMIN 1.6* 1.9* 2.1*    No results for input(s): LIPASE, AMYLASE in the last 168 hours. CBC: Recent Labs  Lab 12/26/21 1530 12/27/21 0125 12/28/21 0702 12/29/21 0129 12/30/21 0149  WBC 15.3* 10.8* 11.2* 8.6 9.6  HGB 7.0* 8.4* 8.9* 7.1* 10.6*  HCT 20.1* 23.7* 25.9* 21.1* 30.7*  MCV 91.8 89.1 91.2 93.8 90.3  PLT 138* 150 186 144* 163     Blood Culture    Component Value Date/Time   SDES BLOOD RIGHT HAND 12/19/2021 0643   SPECREQUEST  12/19/2021 0643    BOTTLES DRAWN AEROBIC ONLY Blood Culture results may not be optimal due to an inadequate volume of blood received in culture bottles   CULT  12/19/2021 0643    NO GROWTH 5 DAYS Performed at [facility] [address] [place], [place] [address]    REPTSTATUS 12/24/2021 FINAL 12/19/2021 5701    Cardiac Enzymes: No results for input(s): CKTOTAL, CKMB, CKMBINDEX, TROPONINI in the last 168 hours. CBG: Recent Labs  Lab 12/26/21 1021 12/26/21 1512  GLUCAP 103* 129*    Iron Studies: No results for input(s): IRON, TIBC, TRANSFERRIN, FERRITIN in the last 72 hours. Lab Results  Component Value Date   INR 1.0 10/16/2019   Studies/Results: No results found.  Medications:  vancomycin 500 mg (12/29/21 2135)    sodium chloride   Intravenous Once   apixaban   Does not apply Once   apixaban  5 mg Oral BID   Chlorhexidine Gluconate Cloth  6 each Topical Q0600   [START ON 12/31/2021] darbepoetin (ARANESP) injection - DIALYSIS  60 mcg Intravenous Q Thu-HD   furosemide  20 mg Intravenous Once   heparin sodium (porcine)  3,200 Units Intravenous Q M,W,F-HD   hydrALAZINE  50 mg Oral Q8H   lidocaine  1 patch Transdermal QAC breakfast   metoprolol tartrate  50 mg Oral BID   polyethylene glycol  34 g Oral Daily   senna-docusate  2 tablet Oral BID   sevelamer carbonate  1,600 mg Oral TID WC    Dialysis Orders: TTS HD at [facility] 4h  400/600  68.5kg  3/2 bath  Hep 2000   - hect 4 ug tiw - venofer 50 q wk - no esa, last Hb 11.9  Assessment/Plan: Sepsis/ MRSA bacteremia - 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 As well concern for vertebral osteo.  ID recommended IV vanc and line holiday. Repeat blood Cx 1/14 neg to date. TTE showed MV vegetation, recommended TEE.   TEE - neg for MV vegetation but R atrial mass seen. Felt to be thrombus related to HD cath and  will need 6 months of [facility]  TDC removed by [facility] on 1/14 for line holiday. New TDC + AVF placed 1/18.  Vancomycin with HD for 8 weeks (end date 02/13/22)  ESRD -HD TTS. Unable to dialyze Saturday d/t AMS and low Bps. Suspected acute blood loss from [facility] -bleeding has stopped. Back on schedule Next HD 1/26.  R atrial thrombus -- will need 6 months of AC. Eliquis started but had bleeding from [facility]. Eliquis resumed at discharge  Afib/A-flutter-Cardiology following --on metoprolol. Eliquis on hold d/t bleeding  Access - New R IJ TDC + L BB 1st stage AVF placed 1/18 Dr. Stanford Breed.  HTN/volume -BP improving. UF as able.  Anemia -Hgb 10.6 s/p 1 unit PRBCs 12/26/21. 1 U 1/24.  Will raise Aranesp for next scheduled dose 1/26.  Secondary hyperparathyroidism -Corrected Ca elevated. Continue Renvela. Hold vitamin D with ^ Ca ,used 2.0 calcium bath.  Right-sided pulmonary nodule.  follow-up intervention per primary/pulm Lower back pain.  Possible osteo-/discitis on MRI LS. Pain management per primary  Dispo: -To SNF when stable.   Lynnda Child PA-C [facility] 12/30/2021,11:39 AM

## 2021-12-30 NOTE — Discharge Summary (Signed)
PATIENT DETAILS Name: Diamond [name] Age: 71 y.o. Sex: female Date of Birth: 1951-03-19 MRN: [id number]. Admitting Physician: Rhetta Mura, DO JOA:[id number], Iona Beard, MD  Admit Date: 12/16/2021 Discharge date: 12/30/2021  Recommendations for Outpatient Follow-up:  Follow up with PCP in 1-2 weeks Please obtain CMP/CBC in one week Please ensure follow-up with ID, cardiology and dialysis clinic.   Vancomycin end date 3/11  Lung nodule seen incidentally-needs repeat CT chest in 3 months.  Admitted From:  Home  Disposition: SNF   Home Health: No  Equipment/Devices: None  Discharge Condition: Stable  CODE STATUS: FULL CODE  Diet recommendation:  Diet Order             Diet renal with fluid restriction Fluid restriction: 2000 mL Fluid; Room service appropriate? Yes with Assist; Fluid consistency: Thin  Diet effective now           Diet - low sodium heart healthy                    Brief Summary: 71 year old female with history of ESRD on HD TTS-presenting with sepsis due to MRSA bacteremia.  Hospital course complicated by A. fib RVR, bleeding from HD catheter site on 1/21 causing hypotension and acute blood loss anemia.  See below for further details   Brief Hospital Course: * Severe sepsis (Diamond [name]) due to MRSA bacteremia, L5-S1 discitis and endocarditis- (present on admission) Sepsis physiology has resolved-repeat blood cultures negative so far-  TEE on 1/17 without obvious valvular vegetations-but did have a right atrial/IVC globular mobile mass compatible with thrombotic/vegetative material.  Reasonable to treat like endocarditis with embolic phenomena causing discitis.  ID recommending IV vancomycin-end date-02/13/2022.  Please ensure follow-up with the ID clinic once patient completes a course of antibiotics.  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 seen on TEE on 1/17 Discussed with cardiologist-Dr. Marlou Porch on 1/18-probably a thrombus related  to HD catheter-recommends at least 6 months of anticoagulation-however now with atrial flutter-will require indefinite anticoagulation.    After discussion with Dr. Hayden Rasmussen surgery on 1/19-full dose anticoagulation was started.  Unfortunately developed bleeding from HD catheter site on 1/21-anticoagulation was held-after hemostasis was obtained-patient was restarted on anticoagulation with IV heparin after discussion with Dr. Scot Dock on 1/22.  Plans are to resume Eliquis prior to discharge.   Atrial flutter with rapid ventricular response-CHA2DS2-VASc 4 Developed RVR on 1/20-initially started on Cardizem infusion-but then transition to amiodarone infusion by cardiology.  Maintaining NSR-no longer on amiodarone-has been transitioned to metoprolol.  As noted above-plans are to transition from IV heparin to Eliquis on discharge.     Hypotension Occurred on 1/21-due to blood loss from bleeding HD catheter site.  Resolved with PRBC transfusion and supportive care.  BP now stable.  Bleeding from dialysis catheter site Occurred around 6:45 AM on 1/21-pressure held-eventually seen by vascular surgery-and sutured at bedside.  No further bleeding since then.   Multifactorial anemia-acute blood loss from bleeding HD catheter site-superimposed on anemia related to ESRD.- (present on admission) Due to worsening hemoglobin from HD catheter site bleeding-transfused 1 unit of PRBC on 01/21.  Hemoglobin dropped again on 1/24-PRBC transfused 2 units-hemoglobin now stable.  Continue to follow hemoglobin periodically at SNF.  Dosing of Aranesp/IV iron will defer to nephrology.  ESRD (end stage renal disease) ([place])- (present on admission) Due to bacteremia given line holiday-dialysis catheter was removed on 1/14, on 1/18-vascular surgery placed right IJ TDC and left brachial AV fistula.  Nephrology following-defer HD care to nephrology.  Hyponatremia- (present on admission) Volume management with  HD-follow.  Hypertension- (present on admission) BP has slowly increased over the past few days-on beta-blocker and hydralazine.  Continue to optimize in the outpatient setting.  Mild intermittent asthma- (present on admission)  Stable-continue as needed bronchodilators.  Lung nodule seen on imaging study- (present on admission)  will need repeat CT chest in 58-month   BMI: Estimated body mass index is 22.44 kg/m as calculated from the following:   Height as of this encounter: 5\' 7"  (1.702 m).   Weight as of this encounter: 65 kg.      Procedures:  1/14>> HD catheter removed 1/17>> TEE 1/18>> right IJ tunnel catheter placement, left brachiocephalic for stage AV fistula creation  Discharge Diagnoses:  Principal Problem:   Severe sepsis ([place]) due to MRSA bacteremia, L5-S1 discitis and endocarditis Active Problems:   Vertebral osteomyelitis ([name])   MRSA bacteremia   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 seen on TEE on 1/17   Atrial flutter with rapid ventricular response-CHA2DS2-VASc 4   Bleeding from dialysis catheter site   Hypotension   Multifactorial anemia-acute blood loss from bleeding HD catheter site-superimposed on anemia related to ESRD.   Hyponatremia   ESRD (end stage renal disease) ([name])   Hypertension   Mild intermittent asthma   Lung nodule seen on imaging study   Discharge Instructions:  Activity:  As tolerated with Full fall precautions use walker/cane & assistance as needed   Discharge Instructions     Diet - low sodium heart healthy   Complete by: As directed    Discharge instructions   Complete by: As directed    Follow with Primary MD  [name], MD in 1-2 weeks  Please get a complete blood count and chemistry panel checked by your Primary MD at your next visit, and again as instructed by your Primary MD.  Get Medicines reviewed and adjusted: Please take all your medications with you for your next visit with your  Primary MD  Laboratory/radiological data: Please request your Primary MD to go over all hospital tests and procedure/radiological results at the follow up, please ask your Primary MD to get all Hospital records sent to his/her office.  In some cases, they will be blood work, cultures and biopsy results pending at the time of your discharge. Please request that your primary care M.D. follows up on these results.  Also Note the following: If you experience worsening of your admission symptoms, develop shortness of breath, life threatening emergency, suicidal or homicidal thoughts you must seek medical attention immediately by calling 911 or calling your MD immediately  if symptoms less severe.  You must read complete instructions/literature along with all the possible adverse reactions/side effects for all the Medicines you take and that have been prescribed to you. Take any new Medicines after you have completely understood and accpet all the possible adverse reactions/side effects.   Do not drive when taking Pain medications or sleeping medications (Benzodaizepines)  Do not take more than prescribed Pain, Sleep and Anxiety Medications. It is not advisable to combine anxiety,sleep and pain medications without talking with your primary care practitioner  Special Instructions: If you have smoked or chewed Tobacco  in the last 2 yrs please stop smoking, stop any regular Alcohol  and or any Recreational drug use.  Wear Seat belts while driving.  Please note: You were cared for by a hospitalist during your hospital stay. Once you are discharged, your primary care physician will handle any further  medical issues. Please note that NO REFILLS for any discharge medications will be authorized once you are discharged, as it is imperative that you return to your primary care physician (or establish a relationship with a primary care physician if you do not have one) for your post hospital discharge needs so  that they can reassess your need for medications and monitor your lab values.   1.  Vancomycin end date 02/13/2022-to be given with hemodialysis  2.  Please ensure follow-up with ID, cardiology and dialysis clinic.   Increase activity slowly   Complete by: As directed    No wound care   Complete by: As directed       Allergies as of 12/30/2021   No Known Allergies      Medication List     STOP taking these medications    diclofenac Sodium 1 % Gel Commonly known as: Voltaren   predniSONE 10 MG tablet Commonly known as: DELTASONE   traMADol 50 MG tablet Commonly known as: ULTRAM   verapamil 120 MG tablet Commonly known as: CALAN       TAKE these medications    acetaminophen 325 MG tablet Commonly known as: TYLENOL Take 2 tablets (650 mg total) by mouth every 6 (six) hours as needed for mild pain (or Fever >/= 101).   albuterol 108 (90 Base) MCG/ACT inhaler Commonly known as: VENTOLIN HFA Inhale 2 puffs into the lungs every 4 (four) hours as needed for wheezing or shortness of breath.   allopurinol 100 MG tablet Commonly known as: ZYLOPRIM Take 200 mg by mouth daily.   apixaban 5 MG Tabs tablet Commonly known as: ELIQUIS Take 1 tablet (5 mg total) by mouth 2 (two) times daily.   colchicine 0.6 MG tablet Take 0.6 mg by mouth daily as needed (gout).   hydrALAZINE 50 MG tablet Commonly known as: APRESOLINE Take 1 tablet (50 mg total) by mouth every 8 (eight) hours.   lidocaine 5 % Commonly known as: LIDODERM Place 1 patch onto the skin daily before breakfast. Remove & Discard patch within 12 hours or as directed by MD Start taking on: December 31, 2021   metoprolol tartrate 50 MG tablet Commonly known as: LOPRESSOR Take 1 tablet (50 mg total) by mouth 2 (two) times daily.   ondansetron 4 MG tablet Commonly known as: ZOFRAN Take 4 mg by mouth every 8 (eight) hours as needed.   oxyCODONE 5 MG immediate release tablet Commonly known as: Oxy  IR/ROXICODONE Take 1 tablet (5 mg total) by mouth every 6 (six) hours as needed for severe pain.   polyethylene glycol 17 g packet Commonly known as: MIRALAX / GLYCOLAX Take 17 g by mouth daily as needed for mild constipation.   senna-docusate 8.6-50 MG tablet Commonly known as: Senokot-S Take 2 tablets by mouth 2 (two) times daily.   sevelamer carbonate 800 MG tablet Commonly known as: RENVELA Take 2 tablets (1,600 mg total) by mouth 3 (three) times daily with meals.   vancomycin 500 MG/100ML IVPB Commonly known as: VANCOREADY Inject 100 mLs (500 mg total) into the vein Every Tuesday,Thursday,and Saturday with dialysis. Start taking on: December 31, 2021        Contact information for follow-up providers     [facility] Follow up in 6 week(s).   Specialty: Vascular Surgery Why: Office will call you to arrange your appt (sent) Contact information: [address] [place] [phone]  [name], MD. Schedule an appointment as soon as possible for a visit in 1 week(s).   Specialty: Internal Medicine Contact information: [address] [place] [address] [phone]         Jabier Mutton, MD Follow up on 02/03/2022.   Specialty: Infectious Diseases Why: appt at 11:15 am Contact information: [place] [address] [phone]         Croitoru, Dani Gobble, MD. Schedule an appointment as soon as possible for a visit in 1 month(s).   Specialty: Cardiology Contact information: [address] [place] [address] [phone]              Contact information for after-discharge care     Destination     [facility] .   Service: Skilled Nursing Contact information: [address] [place] [phone]                    No Known Allergies    Consultations: Nephrology, vascular  surgery, infectious disease, cardiology   Other Procedures/Studies: DG Chest 2 View  Result Date: 12/17/2021 CLINICAL DATA:  Short S of breath, back pain EXAM: CHEST - 2 VIEW COMPARISON:  11/05/2019 FINDINGS: Right dialysis catheter tip in the right atrium. Mild cardiomegaly. No confluent opacities, effusions or edema. No acute bony abnormality. IMPRESSION: Mild cardiomegaly.  No active disease. Electronically Signed   By: Rolm Baptise M.D.   On: 12/17/2021 01:54   DG Lumbar Spine Complete  Result Date: 12/15/2021 CLINICAL DATA:  Provided history: Back pain. EXAM: LUMBAR SPINE - COMPLETE 4+ VIEW COMPARISON:  CT abdomen/pelvis 10/01/2021. radiographs of the lumbar spine 10/01/2021. FINDINGS: Transitional lumbosacral anatomy better appreciated on the prior CT abdomen/pelvis of 10/01/2021. For the purposes of this dictation, the L5 vertebra is sacralized and there is a rudimentary L5-S1 interspace. Mild L3-L4 grade 1 anterolisthesis. Redemonstrated mild chronic L2 superior endplate vertebral compression deformity. Vertebral body height is otherwise maintained. Multilevel disc space narrowing. Most notably, there is moderate/advanced disc space narrowing at L4-L5 with associated degenerative endplate osteophytosis at this level. Multilevel facet arthrosis, greatest within the lower lumbar spine. IMPRESSION: Transitional lumbosacral anatomy, as described. Unchanged mild chronic L2 superior endplate vertebral compression deformity. Lumbar spondylosis, as described and greatest at L4-L5. Mild L3-L4 grade 1 anterolisthesis. Electronically Signed   By: Kellie Simmering D.O.   On: 12/15/2021 16:42   CT CHEST WO CONTRAST  Result Date: 12/22/2021 CLINICAL DATA:  Follow-up 6-8 mm lung nodule left lower lobe present on CT chest in 2011. Shortness of breath. Follow-up abnormal x-ray. EXAM: CT CHEST WITHOUT CONTRAST TECHNIQUE: Multidetector CT imaging of the chest was performed following the standard protocol without IV  contrast. RADIATION DOSE REDUCTION: This exam was performed according to the departmental dose-optimization program which includes automated exposure control, adjustment of the mA and/or kV according to patient size and/or use of iterative reconstruction technique. COMPARISON:  Chest radiographs 12/17/2021, CT chest 10/15/2010 FINDINGS: Cardiovascular: Heart size is mildly enlarged. Coronary artery and aortic root calcifications are noted. The ascending aorta measures up to 4.0 cm in caliber, borderline aneurysmal. This is increased from 3.6 cm on prior CT. Moderate calcifications within the aortic arch. Mediastinum/Nodes: No axillary, mediastinal, or hilar pathologically enlarged lymph nodes by CT criteria. 9 mm short axis inferior right paratracheal lymph node is similar to prior. The thyroid gland is grossly unremarkable. The esophagus follows a normal course of normal caliber. Lungs/Pleura: The  central airways are patent. A 6 x 5 mm left lower lobe pulmonary nodule is unchanged from 10/15/2010 and benign. There is an elongated, tubular soft tissue density within the lateral middle lobe (axial 100/4, sagittal 34/6) measuring up to 7 x 7 x 14 mm (transverse by AP by craniocaudal). Due to the elongated shape, it is unclear whether this may represent a vascular lesion or airway impaction, however this does not appear to involve an airway. This is new compared to the prior remote 10/15/2010 CT. There is a mild-to-moderate right pleural effusion. Question mild nodularity of the adjacent posterior right lower lobe pleura, however this appearance may just be secondary to subsegmental atelectasis. Recommend attention on follow-up. There is a 4 mm nodule within the lingula that may be slightly more distinct, however previously at least a ground-glass density was seen in this same region on remote 10/15/2010 CT (current CT axial 95/4). There are two nearby peripheral left upper lobe apparent cystic/cavitary nodules  measuring approximately 4 mm (axial image 62) and 4 mm (axial image 58). These may be secondary to an infectious process. Punctate calcified granuloma is seen at the far inferior aspect of the left lower lobe (axial image 135), benign. Punctate posteromedial left lower lobe 3 mm nodule (axial image 118). Punctate benign calcified granuloma within left lower lobe (axial image 116). There is curvilinear subsegmental atelectasis versus scarring within the peripheral superior aspect of the left upper lobe. No pneumothorax. Upper Abdomen: The gallbladder is partially visualized. Therer appears to be a low-density gallstone measuring at least 19 mm. The gallbladder wall is not well evaluated on the current noncontrast CT with only partial visualization of the gallbladder. If there are symptoms of right upper quadrant pain, a right upper quadrant abdominal ultrasound may help for further characterization. There is a 2 mm right upper pole renal stone. There are punctate calcifications within the lateral aspect of the spleen. Musculoskeletal: There is again nonspecific but apparently stable mild nodularity within the right breast just deep to the right nipple. There is again benign dystrophic calcification within the left breast similar to prior. Mild to moderate multilevel degenerative disc changes of the visualized thoracic spine. IMPRESSION: Compared to 10/15/2010: 1. The previously seen left lower lobe 5 mm nodule is unchanged from prior, demonstrating 11 year stability. This is benign. 2. There is an elongated, tubular soft tissue density within the lateral middle lobe measuring 7 x 7 x 14 mm. This is indeterminate and could represent a vascular abnormality or a pulmonary nodule. Recommend follow-up CT in 3 months to assess stability. A PET-CT also may be considered for further evaluation. If the nodules are stable at time of repeat CT, then future CT at 18-24 months (from today's scan) is considered optional for  low-risk patients, but is recommended for high-risk patients. This recommendation follows the consensus statement: Guidelines for Management of Incidental Pulmonary Nodules Detected on CT Images: From the Fleischner Society 2017; Radiology 2017; 284:228-243. 3. Additional left pulmonary nodules measuring up to 4 mm. Recommend attention on follow-up. 4. Mild-to-moderate right pleural effusion. Question mild nodularity of the adjacent posterior right lower lobe pleura, however this appearance may just be secondary to subsegmental atelectasis. Recommend attention on follow-up. Electronically Signed   By: Yvonne Kendall M.D.   On: 12/22/2021 10:11   MR THORACIC SPINE WO CONTRAST  Result Date: 12/18/2021 CLINICAL DATA:  Back pain with MRSA bacteremia EXAM: MRI THORACIC SPINE WITHOUT CONTRAST TECHNIQUE: Multiplanar, multisequence MR imaging of the thoracic spine was  performed. No intravenous contrast was administered. COMPARISON:  None. FINDINGS: Alignment: Accentuation of kyphosis. Anteroposterior alignment is maintained. Vertebrae: Mild degenerative endplate irregularity. Marrow signal is mildly heterogeneous. No substantial marrow edema. Cord:  No abnormal signal. Paraspinal and other soft tissues: Trace right pleural effusion. Disc levels: Minor degenerative changes are present with small disc bulges and protrusions and mild facet arthropathy. The spinal canal is widely patent. There is no significant foraminal narrowing. Partially imaged degenerative disc disease at lower cervical levels with canal stenosis. IMPRESSION: No evidence of discitis/osteomyelitis in the thoracic spine. Electronically Signed   By: Macy Mis M.D.   On: 12/18/2021 17:19   MR LUMBAR SPINE WO CONTRAST  Result Date: 12/18/2021 CLINICAL DATA:  Back pain with MRSA bacteremia EXAM: MRI LUMBAR SPINE WITHOUT CONTRAST TECHNIQUE: Multiplanar, multisequence MR imaging of the lumbar spine was performed. No intravenous contrast was  administered. COMPARISON:  None. FINDINGS: Motion artifact is present. Segmentation: Transitional anatomy with sacralized L5 and remained tree L5-S1 disc. Alignment:  Grade 1 anterolisthesis at L3-L4. Vertebrae: Schmorl's node at the superior endplate of L2. Endplate irregularity and marrow edema and sclerosis at L5-S1. Otherwise heterogeneous marrow signal Conus medullaris and cauda equina: Conus extends to the L1 level. Conus and cauda equina appear normal. Paraspinal and other soft tissues: Right psoas edema at L5-S1. Disc levels: L1-L2:  Disc bulge.  Minor canal stenosis.  No foraminal stenosis. L2-L3:  No canal or foraminal stenosis. L3-L4: Anterolisthesis with uncovering of disc bulge. Left greater than right facet arthropathy with ligamentum flavum infolding. There is a left facet joint effusion. Moderate canal stenosis. Partial effacement subarticular recesses. Minor right and mild left foraminal stenosis. L4-L5: Disc edema. Disc bulge with endplate osteophytic ridging. Facet arthropathy with ligamentum flavum infolding. Moderate to marked canal stenosis. Effacement of the right greater than left subarticular recesses. Mild right and mild to moderate left foraminal stenosis. There is some ill-defined signal in the ventral epidural space above and below disc level. L5-S1:  Transitional level with hypoplastic disc.  No stenosis. IMPRESSION: Endplate irregularity and marrow edema with disc edema at L5-S1. Mild right psoas edema. Some ill-defined signal in the ventral epidural space above and below this disc level. This may be degenerative in etiology or reflect discitis/osteomyelitis. Degenerative changes result in moderate to marked canal stenosis at L4-L5 with narrowing of subarticular recesses. Electronically Signed   By: Macy Mis M.D.   On: 12/18/2021 17:15   DG CHEST PORT 1 VIEW  Result Date: 12/26/2021 CLINICAL DATA:  Hypotension. EXAM: PORTABLE CHEST 1 VIEW COMPARISON:  12/23/2021 FINDINGS:  1525 hours. The lungs are clear without focal pneumonia, edema, pneumothorax or pleural effusion. The cardiopericardial silhouette is within normal limits for size. Right IJ central line tip overlies the upper right atrium. Bones are diffusely demineralized. IMPRESSION: 1. No acute cardiopulmonary findings. 2. Right IJ central line tip overlies the upper right atrium. Electronically Signed   By: Misty Stanley M.D.   On: 12/26/2021 15:30   DG Chest Port 1 View  Result Date: 12/23/2021 CLINICAL DATA:  Status post dialysis catheter placement EXAM: PORTABLE CHEST 1 VIEW COMPARISON:  12/18/2011 FINDINGS: Right jugular dialysis catheter is noted in satisfactory position. Cardiac shadow is mildly enlarged but stable. Tortuous thoracic aorta is noted. Lungs are clear. No pneumothorax is noted. IMPRESSION: No acute abnormality following dialysis catheter placement. Electronically Signed   By: Inez Catalina M.D.   On: 12/23/2021 19:09   DG C-Arm 1-60 Min-No Report  Result Date: 12/23/2021 Fluoroscopy  was utilized by the requesting physician.  No radiographic interpretation.   ECHOCARDIOGRAM COMPLETE  Result Date: 12/18/2021    ECHOCARDIOGRAM REPORT   Patient Name:   Diamond [name] Date of Exam: 12/18/2021 Medical Rec #:  [id number]      Height:       67.0 in Accession #:    [id number]     Weight:       152.0 lb Date of Birth:  08-07-1951     BSA:          1.800 m Patient Age:    31 years       BP:           138/83 mmHg Patient Gender: F              HR:           90 bpm. Exam Location:  Inpatient Procedure: 2D Echo Indications:    Bacteremia  History:        Patient has prior history of Echocardiogram examinations, most                 recent 11/06/2019. Risk Factors:Hypertension. Dysrhythmia.  Sonographer:    Arlyss Gandy Referring Phys: [place]  1. Left ventricular ejection fraction, by estimation, is 60 to 65%. The left ventricle has normal function. The left ventricle has no regional  wall motion abnormalities. There is moderate hypertrophy of the basal septum. The rest of the LV segments demonstrate mild left ventricular hypertrophy. Left ventricular diastolic parameters are consistent with Grade I diastolic dysfunction (impaired relaxation).  2. Right ventricular systolic function is normal. The right ventricular size is normal. There is normal pulmonary artery systolic pressure.  3. The mitral valve leaflets are thickened. There are fibrinous densities visualized on the anterior mitral valve leaflet on the apical 3C view. Given underlying bacteremia, findings concerning for vegetation formation (versus degenerative valve changes). There is trivial mitral regurgitation and no evidence of valve destruction. Recommend TEE for further evaluation.  4. The aortic valve is tricuspid. There is mild calcification of the aortic valve. There is moderate thickening of the aortic valve. Aortic valve regurgitation is trivial. Aortic valve sclerosis/calcification is present, without any evidence of aortic stenosis.  5. Aortic dilatation noted. There is mild dilatation of the ascending aorta, measuring 40 mm.  6. There is a highly mobile, fibrinous structure visualized in the RA most likely a eustachian valve or chiari network. Comparison(s): Compared to prior TTE in 11/2019, the mitral valve leaflets continue to be thickened, however, there is concern for possible vegetation on current study. Otherwise, the TR appears mild (previously mod-to-severe). Conclusion(s)/Recommendation(s): Findings concerning for mitral valve vegetation, would recommend a Transesophageal Echocardiogram for clarification. FINDINGS  Left Ventricle: Left ventricular ejection fraction, by estimation, is 60 to 65%. The left ventricle has normal function. The left ventricle has no regional wall motion abnormalities. The left ventricular internal cavity size was normal in size. There is  moderate hypertrophy of the basal septum. The rest  of the LV segments demonstrate mild left ventricular hypertrophy. Left ventricular diastolic parameters are consistent with Grade I diastolic dysfunction (impaired relaxation). Right Ventricle: The right ventricular size is normal. No increase in right ventricular wall thickness. Right ventricular systolic function is normal. There is normal pulmonary artery systolic pressure. The tricuspid regurgitant velocity is 2.84 m/s, and  with an assumed right atrial pressure of 3 mmHg, the estimated right ventricular systolic pressure is 29.7 mmHg. Left Atrium: Left  atrial size was normal in size. Right Atrium: There is a highly mobile, fibrinous structure visualized in the RA most likely a eustachian valve or chiari network. Right atrial size was normal in size. Pericardium: There is no evidence of pericardial effusion. Mitral Valve: The mitral valve leaflets are thickened. There are fibrinous densities visualized on the anterior mitral valve leaflet on the apical 3C view. Given underlying bacteremia, findings concerning for vegetation formation (versus degenerative valve changes). There is trivial mitral regurgitation and no evidence of valve destruction. Recommend TEE for further evaluation. The mitral valve is abnormal. There is moderate thickening of the mitral valve leaflet(s). Mild mitral annular calcification. Trivial mitral valve regurgitation. Tricuspid Valve: The tricuspid valve is normal in structure. Tricuspid valve regurgitation is mild. Aortic Valve: The aortic valve is tricuspid. There is mild calcification of the aortic valve. There is moderate thickening of the aortic valve. Aortic valve regurgitation is trivial. Aortic valve sclerosis/calcification is present, without any evidence of aortic stenosis. Aortic valve mean gradient measures 7.0 mmHg. Aortic valve peak gradient measures 12.7 mmHg. Aortic valve area, by VTI measures 3.19 cm. Pulmonic Valve: The pulmonic valve was normal in structure. Pulmonic  valve regurgitation is trivial. Aorta: Aortic dilatation noted. There is mild dilatation of the ascending aorta, measuring 40 mm. IAS/Shunts: No atrial level shunt detected by color flow Doppler.  LEFT VENTRICLE PLAX 2D LVIDd:         4.00 cm   Diastology LVIDs:         2.80 cm   LV e' medial:    8.10 cm/s LV PW:         1.30 cm   LV E/e' medial:  11.3 LV IVS:        1.30 cm   LV e' lateral:   11.80 cm/s LVOT diam:     2.20 cm   LV E/e' lateral: 7.7 LV SV:         98 LV SV Index:   54 LVOT Area:     3.80 cm  RIGHT VENTRICLE             IVC RV Basal diam:  3.60 cm     IVC diam: 2.00 cm RV S prime:     16.70 cm/s TAPSE (M-mode): 2.6 cm LEFT ATRIUM             Index        RIGHT ATRIUM           Index LA Vol (A2C):   60.2 ml 33.45 ml/m  RA Area:     14.20 cm LA Vol (A4C):   47.4 ml 26.34 ml/m  RA Volume:   33.30 ml  18.50 ml/m LA Biplane Vol: 54.4 ml 30.23 ml/m  AORTIC VALVE AV Area (Vmax):    2.99 cm AV Area (Vmean):   3.13 cm AV Area (VTI):     3.19 cm AV Vmax:           178.00 cm/s AV Vmean:          117.000 cm/s AV VTI:            0.307 m AV Peak Grad:      12.7 mmHg AV Mean Grad:      7.0 mmHg LVOT Vmax:         140.00 cm/s LVOT Vmean:        96.200 cm/s LVOT VTI:          0.258 m LVOT/AV VTI ratio: 0.84  AORTA Ao Root diam: 3.20 cm Ao Asc diam:  4.00 cm MITRAL VALVE               TRICUSPID VALVE MV Area (PHT): 4.41 cm    TR Peak grad:   32.3 mmHg MV Decel Time: 172 msec    TR Vmax:        284.00 cm/s MV E velocity: 91.20 cm/s MV A velocity: 99.60 cm/s  SHUNTS MV E/A ratio:  0.92        Systemic VTI:  0.26 m                            Systemic Diam: 2.20 cm Gwyndolyn Kaufman MD Electronically signed by Gwyndolyn Kaufman MD Signature Date/Time: 12/18/2021/4:24:40 PM    Final    CT Renal Stone Study  Result Date: 12/17/2021 CLINICAL DATA:  Dialysis patient with flank pain. Kidney stone suspected. EXAM: CT ABDOMEN AND PELVIS WITHOUT CONTRAST TECHNIQUE: Multidetector CT imaging of the abdomen and pelvis  was performed following the standard protocol without IV contrast. RADIATION DOSE REDUCTION: This exam was performed according to the departmental dose-optimization program which includes automated exposure control, adjustment of the mA and/or kV according to patient size and/or use of iterative reconstruction technique. COMPARISON:  CT without contrast 10/01/2021. FINDINGS: Lower chest: Above plane of the prior study, there is an ovoid well-circumscribed low-density right middle lobe 10.6 x 7.0 mm nodule laterally on axial image 3 of series 4, with pleural stranding. This was not present on prior chest CT of 10/15/2010 therefore is indeterminate in significance. There are few linear scar-like opacities in the lung bases but no active infiltrates. Cardiac size is normal. There are coronary artery calcifications. No pericardial fluid. Hepatobiliary: As previously there are air containing cholesterol stones in the gallbladder up to 2.5 cm in diameter. Equivocal mild thickening of the fundal wall but no pericholecystic edema or fluid is seen and no biliary dilatation. No focal abnormality of unenhanced liver. Pancreas: Unremarkable without contrast.  No adjacent edema. Spleen: Unremarkable without contrast apart from calcified granulomas. Adrenals/Urinary Tract: There is no adrenal mass. There are small renal cysts. Benign cortical calcification of the lower lateral right kidney is again noted. No intrarenal stone is seen and no ureteral stones or hydronephrosis. Mild bladder thickening versus underdistention. Stomach/Bowel: No dilatation or wall thickening including the appendix. There is fluid in the colon in the ascending and transverse segments. Vascular/Lymphatic: Aortic atherosclerosis. No enlarged abdominal or pelvic lymph nodes. Reproductive: The uterus is intact. There are vascular calcifications in the outer wall. The ovaries are not enlarged. Other: There is no free air, hemorrhage or fluid. Small umbilical  fat hernia. Musculoskeletal: Transitional anatomy at L5 with sacralization. Osteopenia and degenerative changes are noted with reactive endplate sclerosis at C1-2, chronic disc collapse. At L2, there previously appeared to be a nondisplaced longitudinal fracture through the anterior column of the vertebral body, which appears to have largely healed in the interval but with prominent endplate Schmorl's node in the superior vertebral body now seen. There is only a slight loss of the anterior vertebral height at this level. Reactive sclerosis in the L2 vertebral body is consistent with a healing response. IMPRESSION: 1. No urinary stones or obstruction on the current and prior studies. 2. Cholelithiasis with equivocal thickening of the fundal wall but no pericholecystic edema or fluid. This was also seen previously. If there are localizing symptoms, ultrasound may be helpful. 3. Cystitis  versus bladder nondistention. 4. Fluid in the ascending and transverse colon which could be due to a diarrheal enteritis. No wall thickening or inflammatory change. 5. Previous study with suspected longitudinal nondisplaced fracture of the anterior column of L2. The fracture line is no longer visible. There is a prominent upper plate Schmorl's node now seen and only slight loss of anterior vertebral body height, without retropulsion. Marrow sclerosis in the vertebral body is consistent with healing response. 6. Aortic atherosclerosis. 7. 10.6 x 7.0 mm well-circumscribed oval right middle lobe nodule above the plane of the recent prior abdomen and pelvis CT and not seen on a 2011 chest CT. Three-month follow-up CT or PET-CT recommended. Electronically Signed   By: Telford Nab M.D.   On: 12/17/2021 00:32   ECHO TEE  Result Date: 12/22/2021    TRANSESOPHOGEAL ECHO REPORT   Patient Name:   Diamond [name] Date of Exam: 12/22/2021 Medical Rec #:  [id number]      Height:       67.0 in Accession #:    [id number]     Weight:       142.2 lb  Date of Birth:  [birth year]     BSA:          1.749 m Patient Age:    4 years       BP:           124/76 mmHg Patient Gender: F              HR:           75 bpm. Exam Location:  Inpatient Procedure: Transesophageal Echo, 3D Echo, Cardiac Doppler and Color Doppler Indications:     Bacteremia  History:         Patient has prior history of Echocardiogram examinations, most                  recent 12/18/2021. Risk Factors:Hypertension.  Sonographer:     Bernadene Person RDCS Referring Phys:  [id number] Leanor Kail Diagnosing Phys: Candee Furbish MD PROCEDURE: After discussion of the risks and benefits of a TEE, an informed consent was obtained from the patient. The transesophogeal probe was passed without difficulty through the esophogus of the patient. Sedation performed by different physician. The patient was monitored while under deep sedation. Anesthestetic sedation was provided intravenously by Anesthesiology: 512.14mg  of Propofol, 60mg  of Lidocaine. The patient's vital signs; including heart rate, blood pressure, and oxygen saturation; remained stable throughout the procedure. The patient developed no complications during the procedure. IMPRESSIONS  1. There is a 2.1 x 1.37 mutilobulated mobile mass attached to the RA/IVC junction which has the appearance of thrombus/vegetation in the setting of bacteremia and recently removed dialysis catheter.  2. Left ventricular ejection fraction, by estimation, is 60 to 65%. The left ventricle has normal function. The left ventricle has no regional wall motion abnormalities.  3. Right ventricular systolic function is normal. The right ventricular size is normal.  4. No left atrial/left atrial appendage thrombus was detected.  5. No mitral valve vegetation. The mitral valve is degenerative. Trivial mitral valve regurgitation. No evidence of mitral stenosis.  6. The aortic valve is normal in structure. Aortic valve regurgitation is not visualized. No aortic stenosis is present.   7. The inferior vena cava is normal in size with greater than 50% respiratory variability, suggesting right atrial pressure of 3 mmHg. FINDINGS  Left Ventricle: Left ventricular ejection fraction, by estimation, is 60 to 65%. The left ventricle  has normal function. The left ventricle has no regional wall motion abnormalities. The left ventricular internal cavity size was normal in size. There is  no left ventricular hypertrophy. Right Ventricle: The right ventricular size is normal. No increase in right ventricular wall thickness. Right ventricular systolic function is normal. Left Atrium: Left atrial size was normal in size. No left atrial/left atrial appendage thrombus was detected. Right Atrium: Right atrial size was normal in size. Pericardium: There is no evidence of pericardial effusion. Mitral Valve: No mitral valve vegetation. The mitral valve is degenerative in appearance. There is mild thickening of the mitral valve leaflet(s). There is mild calcification of the mitral valve leaflet(s). Trivial mitral valve regurgitation. No evidence  of mitral valve stenosis. Tricuspid Valve: The tricuspid valve is normal in structure. Tricuspid valve regurgitation is not demonstrated. No evidence of tricuspid stenosis. Aortic Valve: The aortic valve is normal in structure. Aortic valve regurgitation is not visualized. No aortic stenosis is present. Pulmonic Valve: The pulmonic valve was normal in structure. Pulmonic valve regurgitation is not visualized. No evidence of pulmonic stenosis. Aorta: The aortic root is normal in size and structure. Venous: The inferior vena cava is normal in size with greater than 50% respiratory variability, suggesting right atrial pressure of 3 mmHg. IAS/Shunts: No atrial level shunt detected by color flow Doppler. Additional Comments: There is a 2.1 x 1.37 mutilobulated mobile mass attached to the RA/IVC junction which has the appearance of thrombus/vegetation in the setting of bacteremia  and recently removed dialysis catheter. Candee Furbish MD Electronically signed by Candee Furbish MD Signature Date/Time: 12/22/2021/3:48:22 PM    Final    US Abdomen Limited RUQ (LIVER/GB)  Result Date: 12/17/2021 CLINICAL DATA:  Back pain EXAM: ULTRASOUND ABDOMEN LIMITED RIGHT UPPER QUADRANT COMPARISON:  CT earlier today FINDINGS: Gallbladder: Multiple stones fill the gallbladder, measuring up to 2.3 cm. Gallbladder wall is borderline in thickness at 3 mm. Negative sonographic Murphy's. Common bile duct: Diameter: Mildly dilated at 9 mm, tapering distally. Liver: No focal lesion identified. Within normal limits in parenchymal echogenicity. Portal vein is patent on color Doppler imaging with normal direction of blood flow towards the liver. Other: None. IMPRESSION: Cholelithiasis. No convincing sonographic evidence of acute cholecystitis. Common bile duct is mildly dilated proximally, then tapers distally. Recommend correlation with LFTs. Electronically Signed   By: Rolm Baptise M.D.   On: 12/17/2021 01:35     TODAY-DAY OF DISCHARGE:  Subjective:   Diamond [name] today has no headache,no chest abdominal pain,no new weakness tingling or numbness, feels much better wants to go home today.   Objective:   Blood pressure (!) 154/90, pulse 89, temperature 99.2 F (37.3 C), temperature source Oral, resp. rate 16, height 5\' 7"  (1.702 m), weight 65 kg, SpO2 97 %.  Intake/Output Summary (Last 24 hours) at 12/30/2021 0951 Last data filed at 12/29/2021 1858 Gross per 24 hour  Intake 711.22 ml  Output 1500 ml  Net -788.78 ml   Filed Weights   12/28/21 1105 12/29/21 0342 12/29/21 1617  Weight: 64.1 kg 68.9 kg 65 kg    Exam: Awake Alert, Oriented *3, No new F.N deficits, Normal affect [place].AT,PERRAL Supple Neck,No JVD, No cervical lymphadenopathy appriciated.  Symmetrical Chest wall movement, Good air movement bilaterally, CTAB RRR,No Gallops,Rubs or new Murmurs, No Parasternal Heave +ve B.Sounds, Abd Soft,  Non tender, No organomegaly appriciated, No rebound -guarding or rigidity. No Cyanosis, Clubbing or edema, No new Rash or bruise   PERTINENT RADIOLOGIC STUDIES: No results found.  PERTINENT LAB RESULTS: CBC: Recent Labs    12/29/21 0129 12/30/21 0149  WBC 8.6 9.6  HGB 7.1* 10.6*  HCT 21.1* 30.7*  PLT 144* 163   CMET CMP     Component Value Date/Time   NA 127 (L) 12/28/2021 0702   K 4.3 12/28/2021 0702   CL 89 (L) 12/28/2021 0702   CO2 25 12/28/2021 0702   GLUCOSE 88 12/28/2021 0702   BUN 46 (H) 12/28/2021 0702   CREATININE 6.72 (H) 12/28/2021 0702   CREATININE 5.76 (H) 12/31/2020 1456   CALCIUM 8.9 12/28/2021 0702   PROT 5.0 (L) 12/26/2021 1530   ALBUMIN 2.1 (L) 12/28/2021 0702   AST 15 12/26/2021 1530   ALT 8 12/26/2021 1530   ALKPHOS 49 12/26/2021 1530   BILITOT 0.4 12/26/2021 1530   GFRNONAA 6 (L) 12/28/2021 0702   GFRAA 14 (L) 11/12/2019 0344    GFR Estimated Creatinine Clearance: 7.4 mL/min (A) (by C-G formula based on SCr of 6.72 mg/dL (H)). No results for input(s): LIPASE, AMYLASE in the last 72 hours. No results for input(s): CKTOTAL, CKMB, CKMBINDEX, TROPONINI in the last 72 hours. Invalid input(s): POCBNP No results for input(s): DDIMER in the last 72 hours. No results for input(s): HGBA1C in the last 72 hours. No results for input(s): CHOL, HDL, LDLCALC, TRIG, CHOLHDL, LDLDIRECT in the last 72 hours. No results for input(s): TSH, T4TOTAL, T3FREE, THYROIDAB in the last 72 hours.  Invalid input(s): FREET3 No results for input(s): VITAMINB12, FOLATE, FERRITIN, TIBC, IRON, RETICCTPCT in the last 72 hours. Coags: No results for input(s): INR in the last 72 hours.  Invalid input(s): PT Microbiology: Recent Results (from the past 240 hour(s))  Resp Panel by RT-PCR (Flu A&B, Covid) Nasopharyngeal Swab     Status: None   Collection Time: 12/28/21  3:40 PM   Specimen: Nasopharyngeal Swab; Nasopharyngeal(NP) swabs in vial transport medium  Result Value  Ref Range Status   SARS Coronavirus 2 by RT PCR NEGATIVE NEGATIVE Final    Comment: (NOTE) SARS-CoV-2 target nucleic acids are NOT DETECTED.  The SARS-CoV-2 RNA is generally detectable in upper respiratory specimens during the acute phase of infection. The lowest concentration of SARS-CoV-2 viral copies this assay can detect is 138 copies/mL. A negative result does not preclude SARS-Cov-2 infection and should not be used as the sole basis for treatment or other patient management decisions. A negative result may occur with  improper specimen collection/handling, submission of specimen other than nasopharyngeal swab, presence of viral mutation(s) within the areas targeted by this assay, and inadequate number of viral copies(<138 copies/mL). A negative result must be combined with clinical observations, patient history, and epidemiological information. The expected result is Negative.  Fact Sheet for Patients:  EntrepreneurPulse.com.au  Fact Sheet for Healthcare Providers:  IncredibleEmployment.be  This test is no t yet approved or cleared by the [place] FDA and  has been authorized for detection and/or diagnosis of SARS-CoV-2 by FDA under an Emergency Use Authorization (EUA). This EUA will remain  in effect (meaning this test can be used) for the duration of the COVID-19 declaration under Section 564(b)(1) of the Act, 21 U.S.C.section 360bbb-3(b)(1), unless the authorization is terminated  or revoked sooner.       Influenza A by PCR NEGATIVE NEGATIVE Final   Influenza B by PCR NEGATIVE NEGATIVE Final    Comment: (NOTE) The Xpert Xpress SARS-CoV-2/FLU/RSV plus assay is intended as an aid in the diagnosis of influenza from Nasopharyngeal swab specimens and should not be  used as a sole basis for treatment. Nasal washings and aspirates are unacceptable for Xpert Xpress SARS-CoV-2/FLU/RSV testing.  Fact Sheet for  Patients: EntrepreneurPulse.com.au  Fact Sheet for Healthcare Providers: IncredibleEmployment.be  This test is not yet approved or cleared by the [place] FDA and has been authorized for detection and/or diagnosis of SARS-CoV-2 by FDA under an Emergency Use Authorization (EUA). This EUA will remain in effect (meaning this test can be used) for the duration of the COVID-19 declaration under Section 564(b)(1) of the Act, 21 U.S.C. section 360bbb-3(b)(1), unless the authorization is terminated or revoked.  Performed at [facility], [place] [address] [place], [place] [address]     FURTHER DISCHARGE INSTRUCTIONS:  Get Medicines reviewed and adjusted: Please take all your medications with you for your next visit with your Primary MD  Laboratory/radiological data: Please request your Primary MD to go over all hospital tests and procedure/radiological results at the follow up, please ask your Primary MD to get all Hospital records sent to his/her office.  In some cases, they will be blood work, cultures and biopsy results pending at the time of your discharge. Please request that your primary care M.D. goes through all the records of your hospital data and follows up on these results.  Also Note the following: If you experience worsening of your admission symptoms, develop shortness of breath, life threatening emergency, suicidal or homicidal thoughts you must seek medical attention immediately by calling 911 or calling your MD immediately  if symptoms less severe.  You must read complete instructions/literature along with all the possible adverse reactions/side effects for all the Medicines you take and that have been prescribed to you. Take any new Medicines after you have completely understood and accpet all the possible adverse reactions/side effects.   Do not drive when taking Pain medications or sleeping medications (Benzodaizepines)  Do  not take more than prescribed Pain, Sleep and Anxiety Medications. It is not advisable to combine anxiety,sleep and pain medications without talking with your primary care practitioner  Special Instructions: If you have smoked or chewed Tobacco  in the last 2 yrs please stop smoking, stop any regular Alcohol  and or any Recreational drug use.  Wear Seat belts while driving.  Please note: You were cared for by a hospitalist during your hospital stay. Once you are discharged, your primary care physician will handle any further medical issues. Please note that NO REFILLS for any discharge medications will be authorized once you are discharged, as it is imperative that you return to your primary care physician (or establish a relationship with a primary care physician if you do not have one) for your post hospital discharge needs so that they can reassess your need for medications and monitor your lab values.  Total Time spent coordinating discharge including counseling, education and face to face time equals 35 minutes.  SignedOren Binet 12/30/2021 9:51 AM

## 2021-12-30 NOTE — Progress Notes (Signed)
Report called to April at [facility]. All questions and concerns addressed. PIV removed. Will wait for [facility] ambulance services.

## 2021-12-30 NOTE — Care Management Important Message (Signed)
Important Message  Patient Details  Name: Diamond [name] MRN: [id number] Date of Birth: Apr 19, 1951   Medicare Important Message Given:  Yes     Hannah Beat 12/30/2021, 12:14 PM

## 2021-12-30 NOTE — Progress Notes (Signed)
D/C order noted and CSW confirms snf received insurance auth. Contacted Emma to make clinic aware of pt's d/c to [facility] today and pt will resume care tomorrow.   Melven Sartorius Renal Navigator [phone]

## 2021-12-31 DIAGNOSIS — A4102 Sepsis due to Methicillin resistant Staphylococcus aureus: Secondary | ICD-10-CM | POA: Insufficient documentation

## 2022-01-02 DIAGNOSIS — N186 End stage renal disease: Secondary | ICD-10-CM | POA: Diagnosis not present

## 2022-01-02 DIAGNOSIS — N2581 Secondary hyperparathyroidism of renal origin: Secondary | ICD-10-CM | POA: Diagnosis not present

## 2022-01-02 DIAGNOSIS — Z992 Dependence on renal dialysis: Secondary | ICD-10-CM | POA: Diagnosis not present

## 2022-01-04 ENCOUNTER — Other Ambulatory Visit: Payer: Self-pay

## 2022-01-04 DIAGNOSIS — M4646 Discitis, unspecified, lumbar region: Secondary | ICD-10-CM | POA: Diagnosis not present

## 2022-01-04 DIAGNOSIS — A419 Sepsis, unspecified organism: Secondary | ICD-10-CM | POA: Diagnosis not present

## 2022-01-04 DIAGNOSIS — R6520 Severe sepsis without septic shock: Secondary | ICD-10-CM | POA: Diagnosis not present

## 2022-01-04 DIAGNOSIS — R7881 Bacteremia: Secondary | ICD-10-CM | POA: Diagnosis not present

## 2022-01-04 DIAGNOSIS — I513 Intracardiac thrombosis, not elsewhere classified: Secondary | ICD-10-CM | POA: Diagnosis not present

## 2022-01-04 DIAGNOSIS — M4620 Osteomyelitis of vertebra, site unspecified: Secondary | ICD-10-CM | POA: Diagnosis not present

## 2022-01-04 DIAGNOSIS — I4891 Unspecified atrial fibrillation: Secondary | ICD-10-CM | POA: Diagnosis not present

## 2022-01-04 DIAGNOSIS — B9562 Methicillin resistant Staphylococcus aureus infection as the cause of diseases classified elsewhere: Secondary | ICD-10-CM | POA: Diagnosis not present

## 2022-01-04 DIAGNOSIS — I330 Acute and subacute infective endocarditis: Secondary | ICD-10-CM | POA: Diagnosis not present

## 2022-01-04 NOTE — Patient Outreach (Signed)
[facility]) Care Management  01/04/2022  Diamond [name] 1950/12/26 [id number]     Transition of Care Referral  Referral Date: 01/04/2022 Referral Source: Discharge Report Date of Discharge: 12/30/2021 Facility: [facility]    Referral received. Upon chart review noted that patient discharged to SNF.     Plan: RN CM will close referral.   Enzo Montgomery, RN,BSN,CCM [place] Management Telephonic Care Management Coordinator Direct Phone: [phone] Toll Free: [phone] Fax: [phone]

## 2022-01-04 NOTE — Patient Outreach (Signed)
[facility] Care Management  01/04/2022  SHAUNDREA CARRIGG June 09, 1951 [id number]   [employer] member referred for transition of care needs. Request assigned to Enzo Montgomery, RN for follow up.  Ina Homes [facility] Management Assistant [phone]

## 2022-01-05 DIAGNOSIS — N2581 Secondary hyperparathyroidism of renal origin: Secondary | ICD-10-CM | POA: Diagnosis not present

## 2022-01-05 DIAGNOSIS — I120 Hypertensive chronic kidney disease with stage 5 chronic kidney disease or end stage renal disease: Secondary | ICD-10-CM | POA: Diagnosis not present

## 2022-01-05 DIAGNOSIS — Z992 Dependence on renal dialysis: Secondary | ICD-10-CM | POA: Diagnosis not present

## 2022-01-05 DIAGNOSIS — N186 End stage renal disease: Secondary | ICD-10-CM | POA: Diagnosis not present

## 2022-01-06 DIAGNOSIS — K828 Other specified diseases of gallbladder: Secondary | ICD-10-CM | POA: Diagnosis not present

## 2022-01-06 DIAGNOSIS — M549 Dorsalgia, unspecified: Secondary | ICD-10-CM | POA: Diagnosis not present

## 2022-01-06 DIAGNOSIS — I480 Paroxysmal atrial fibrillation: Secondary | ICD-10-CM | POA: Diagnosis not present

## 2022-01-06 DIAGNOSIS — Z87898 Personal history of other specified conditions: Secondary | ICD-10-CM | POA: Diagnosis not present

## 2022-01-06 DIAGNOSIS — A419 Sepsis, unspecified organism: Secondary | ICD-10-CM | POA: Diagnosis not present

## 2022-01-06 DIAGNOSIS — I499 Cardiac arrhythmia, unspecified: Secondary | ICD-10-CM | POA: Diagnosis not present

## 2022-01-06 DIAGNOSIS — K2981 Duodenitis with bleeding: Secondary | ICD-10-CM | POA: Diagnosis not present

## 2022-01-06 DIAGNOSIS — D631 Anemia in chronic kidney disease: Secondary | ICD-10-CM | POA: Diagnosis not present

## 2022-01-06 DIAGNOSIS — B9562 Methicillin resistant Staphylococcus aureus infection as the cause of diseases classified elsewhere: Secondary | ICD-10-CM | POA: Diagnosis present

## 2022-01-06 DIAGNOSIS — K3189 Other diseases of stomach and duodenum: Secondary | ICD-10-CM | POA: Diagnosis not present

## 2022-01-06 DIAGNOSIS — R7881 Bacteremia: Secondary | ICD-10-CM | POA: Diagnosis not present

## 2022-01-06 DIAGNOSIS — M545 Low back pain, unspecified: Secondary | ICD-10-CM | POA: Diagnosis not present

## 2022-01-06 DIAGNOSIS — R1084 Generalized abdominal pain: Secondary | ICD-10-CM | POA: Diagnosis not present

## 2022-01-06 DIAGNOSIS — K838 Other specified diseases of biliary tract: Secondary | ICD-10-CM | POA: Diagnosis not present

## 2022-01-06 DIAGNOSIS — N281 Cyst of kidney, acquired: Secondary | ICD-10-CM | POA: Diagnosis not present

## 2022-01-06 DIAGNOSIS — I5189 Other ill-defined heart diseases: Secondary | ICD-10-CM | POA: Diagnosis not present

## 2022-01-06 DIAGNOSIS — I330 Acute and subacute infective endocarditis: Secondary | ICD-10-CM | POA: Diagnosis not present

## 2022-01-06 DIAGNOSIS — E876 Hypokalemia: Secondary | ICD-10-CM | POA: Diagnosis present

## 2022-01-06 DIAGNOSIS — Z8616 Personal history of COVID-19: Secondary | ICD-10-CM | POA: Diagnosis not present

## 2022-01-06 DIAGNOSIS — I120 Hypertensive chronic kidney disease with stage 5 chronic kidney disease or end stage renal disease: Secondary | ICD-10-CM | POA: Diagnosis not present

## 2022-01-06 DIAGNOSIS — K635 Polyp of colon: Secondary | ICD-10-CM | POA: Diagnosis present

## 2022-01-06 DIAGNOSIS — J45998 Other asthma: Secondary | ICD-10-CM | POA: Diagnosis not present

## 2022-01-06 DIAGNOSIS — M109 Gout, unspecified: Secondary | ICD-10-CM | POA: Diagnosis not present

## 2022-01-06 DIAGNOSIS — K2289 Other specified disease of esophagus: Secondary | ICD-10-CM | POA: Diagnosis not present

## 2022-01-06 DIAGNOSIS — M898X9 Other specified disorders of bone, unspecified site: Secondary | ICD-10-CM | POA: Diagnosis present

## 2022-01-06 DIAGNOSIS — L853 Xerosis cutis: Secondary | ICD-10-CM | POA: Diagnosis not present

## 2022-01-06 DIAGNOSIS — I10 Essential (primary) hypertension: Secondary | ICD-10-CM | POA: Diagnosis not present

## 2022-01-06 DIAGNOSIS — Z0189 Encounter for other specified special examinations: Secondary | ICD-10-CM | POA: Diagnosis not present

## 2022-01-06 DIAGNOSIS — Z8249 Family history of ischemic heart disease and other diseases of the circulatory system: Secondary | ICD-10-CM | POA: Diagnosis not present

## 2022-01-06 DIAGNOSIS — D649 Anemia, unspecified: Secondary | ICD-10-CM | POA: Diagnosis not present

## 2022-01-06 DIAGNOSIS — R9431 Abnormal electrocardiogram [ECG] [EKG]: Secondary | ICD-10-CM | POA: Diagnosis not present

## 2022-01-06 DIAGNOSIS — G8929 Other chronic pain: Secondary | ICD-10-CM | POA: Diagnosis present

## 2022-01-06 DIAGNOSIS — I318 Other specified diseases of pericardium: Secondary | ICD-10-CM | POA: Diagnosis not present

## 2022-01-06 DIAGNOSIS — K8020 Calculus of gallbladder without cholecystitis without obstruction: Secondary | ICD-10-CM | POA: Diagnosis not present

## 2022-01-06 DIAGNOSIS — R911 Solitary pulmonary nodule: Secondary | ICD-10-CM | POA: Diagnosis not present

## 2022-01-06 DIAGNOSIS — M4627 Osteomyelitis of vertebra, lumbosacral region: Secondary | ICD-10-CM | POA: Diagnosis present

## 2022-01-06 DIAGNOSIS — M4620 Osteomyelitis of vertebra, site unspecified: Secondary | ICD-10-CM | POA: Diagnosis not present

## 2022-01-06 DIAGNOSIS — K922 Gastrointestinal hemorrhage, unspecified: Secondary | ICD-10-CM | POA: Diagnosis not present

## 2022-01-06 DIAGNOSIS — Z992 Dependence on renal dialysis: Secondary | ICD-10-CM | POA: Diagnosis not present

## 2022-01-06 DIAGNOSIS — I483 Typical atrial flutter: Secondary | ICD-10-CM | POA: Diagnosis not present

## 2022-01-06 DIAGNOSIS — K829 Disease of gallbladder, unspecified: Secondary | ICD-10-CM | POA: Diagnosis present

## 2022-01-06 DIAGNOSIS — D62 Acute posthemorrhagic anemia: Secondary | ICD-10-CM | POA: Diagnosis not present

## 2022-01-06 DIAGNOSIS — K8689 Other specified diseases of pancreas: Secondary | ICD-10-CM | POA: Diagnosis not present

## 2022-01-06 DIAGNOSIS — N2581 Secondary hyperparathyroidism of renal origin: Secondary | ICD-10-CM | POA: Diagnosis not present

## 2022-01-06 DIAGNOSIS — M4646 Discitis, unspecified, lumbar region: Secondary | ICD-10-CM | POA: Diagnosis not present

## 2022-01-06 DIAGNOSIS — K644 Residual hemorrhoidal skin tags: Secondary | ICD-10-CM | POA: Diagnosis present

## 2022-01-06 DIAGNOSIS — I4892 Unspecified atrial flutter: Secondary | ICD-10-CM | POA: Diagnosis not present

## 2022-01-06 DIAGNOSIS — B3781 Candidal esophagitis: Secondary | ICD-10-CM | POA: Diagnosis not present

## 2022-01-06 DIAGNOSIS — R195 Other fecal abnormalities: Secondary | ICD-10-CM | POA: Diagnosis not present

## 2022-01-06 DIAGNOSIS — A4102 Sepsis due to Methicillin resistant Staphylococcus aureus: Secondary | ICD-10-CM | POA: Diagnosis not present

## 2022-01-06 DIAGNOSIS — N186 End stage renal disease: Secondary | ICD-10-CM | POA: Diagnosis not present

## 2022-01-07 DIAGNOSIS — A4102 Sepsis due to Methicillin resistant Staphylococcus aureus: Secondary | ICD-10-CM | POA: Diagnosis not present

## 2022-01-07 DIAGNOSIS — N2581 Secondary hyperparathyroidism of renal origin: Secondary | ICD-10-CM | POA: Diagnosis not present

## 2022-01-07 DIAGNOSIS — N186 End stage renal disease: Secondary | ICD-10-CM | POA: Diagnosis not present

## 2022-01-07 DIAGNOSIS — Z992 Dependence on renal dialysis: Secondary | ICD-10-CM | POA: Diagnosis not present

## 2022-01-08 DIAGNOSIS — D649 Anemia, unspecified: Secondary | ICD-10-CM | POA: Diagnosis not present

## 2022-01-08 DIAGNOSIS — M4646 Discitis, unspecified, lumbar region: Secondary | ICD-10-CM | POA: Diagnosis not present

## 2022-01-08 DIAGNOSIS — N186 End stage renal disease: Secondary | ICD-10-CM | POA: Diagnosis not present

## 2022-01-09 DIAGNOSIS — A4102 Sepsis due to Methicillin resistant Staphylococcus aureus: Secondary | ICD-10-CM | POA: Diagnosis not present

## 2022-01-09 DIAGNOSIS — N2581 Secondary hyperparathyroidism of renal origin: Secondary | ICD-10-CM | POA: Diagnosis not present

## 2022-01-09 DIAGNOSIS — N186 End stage renal disease: Secondary | ICD-10-CM | POA: Diagnosis not present

## 2022-01-09 DIAGNOSIS — Z992 Dependence on renal dialysis: Secondary | ICD-10-CM | POA: Diagnosis not present

## 2022-01-11 DIAGNOSIS — Z0189 Encounter for other specified special examinations: Secondary | ICD-10-CM | POA: Diagnosis not present

## 2022-01-11 DIAGNOSIS — D649 Anemia, unspecified: Secondary | ICD-10-CM | POA: Diagnosis not present

## 2022-01-11 DIAGNOSIS — N186 End stage renal disease: Secondary | ICD-10-CM | POA: Diagnosis not present

## 2022-01-11 DIAGNOSIS — L853 Xerosis cutis: Secondary | ICD-10-CM | POA: Diagnosis not present

## 2022-01-11 DIAGNOSIS — R7881 Bacteremia: Secondary | ICD-10-CM | POA: Diagnosis not present

## 2022-01-11 DIAGNOSIS — M4646 Discitis, unspecified, lumbar region: Secondary | ICD-10-CM | POA: Diagnosis not present

## 2022-01-11 DIAGNOSIS — M4620 Osteomyelitis of vertebra, site unspecified: Secondary | ICD-10-CM | POA: Diagnosis not present

## 2022-01-12 DIAGNOSIS — N186 End stage renal disease: Secondary | ICD-10-CM | POA: Diagnosis not present

## 2022-01-12 DIAGNOSIS — N2581 Secondary hyperparathyroidism of renal origin: Secondary | ICD-10-CM | POA: Diagnosis not present

## 2022-01-12 DIAGNOSIS — Z992 Dependence on renal dialysis: Secondary | ICD-10-CM | POA: Diagnosis not present

## 2022-01-13 ENCOUNTER — Ambulatory Visit (HOSPITAL_BASED_OUTPATIENT_CLINIC_OR_DEPARTMENT_OTHER): Payer: Medicare HMO | Admitting: Family

## 2022-01-13 ENCOUNTER — Encounter (HOSPITAL_BASED_OUTPATIENT_CLINIC_OR_DEPARTMENT_OTHER): Payer: Self-pay | Admitting: Family

## 2022-01-13 ENCOUNTER — Other Ambulatory Visit: Payer: Self-pay

## 2022-01-13 VITALS — BP 150/90 | HR 80 | Ht 67.0 in | Wt 143.0 lb

## 2022-01-13 DIAGNOSIS — I5189 Other ill-defined heart diseases: Secondary | ICD-10-CM | POA: Diagnosis not present

## 2022-01-13 DIAGNOSIS — Z87898 Personal history of other specified conditions: Secondary | ICD-10-CM | POA: Diagnosis not present

## 2022-01-13 DIAGNOSIS — I483 Typical atrial flutter: Secondary | ICD-10-CM

## 2022-01-13 DIAGNOSIS — N186 End stage renal disease: Secondary | ICD-10-CM

## 2022-01-13 DIAGNOSIS — R911 Solitary pulmonary nodule: Secondary | ICD-10-CM

## 2022-01-13 NOTE — Patient Instructions (Signed)
Medication Instructions:  Continue your current medications.   *If you need a refill on your cardiac medications before your next appointment, please call your pharmacy*   Lab Work: None ordered today.   Testing/Procedures: Your EKG today showed normal sinus rhythm with occasional early beat which is common and not of concern.   Your physician has requested that you have an echocardiogram after 02/15/22. Echocardiography is a painless test that uses sound waves to create images of your heart. It provides your doctor with information about the size and shape of your heart and how well your hearts chambers and valves are working. This procedure takes approximately one hour. There are no restrictions for this procedure.    Follow-Up: At [facility], you and your health needs are our priority.  As part of our continuing mission to provide you with exceptional heart care, we have created designated Provider Care Teams.  These Care Teams include your primary Cardiologist (physician) and Advanced Practice Providers (APPs -  Physician Assistants and Nurse Practitioners) who all work together to provide you with the care you need, when you need it.  We recommend signing up for the patient portal called "MyChart".  Sign up information is provided on this After Visit Summary.  MyChart is used to connect with patients for Virtual Visits (Telemedicine).  Patients are able to view lab/test results, encounter notes, upcoming appointments, etc.  Non-urgent messages can be sent to your provider as well.   To learn more about what you can do with MyChart, go to [email or web address].    Your next appointment:   2 month(s)  The format for your next appointment:   In Person  Provider:   Freada Bergeron, MD or Loel Dubonnet, NP     Other Instructions  Heart Healthy Diet Recommendations: A low-salt diet is recommended. Meats should be grilled, baked, or boiled. Avoid fried foods. Focus on  lean protein sources like fish or chicken with vegetables and fruits. The American Heart Association is a Microbiologist!  American Heart Association Diet and Lifeystyle Recommendations    Exercise recommendations: The American Heart Association recommends 150 minutes of moderate intensity exercise weekly. Try 30 minutes of moderate intensity exercise 4-5 times per week. This could include walking, jogging, or swimming. Keep up the good work with PT!

## 2022-01-13 NOTE — Progress Notes (Signed)
Office Visit    Patient Name: Diamond [name] Date of Encounter: 01/13/2022  PCP:  [name], MD   [place]  Cardiologist:  Freada Bergeron, MD  Advanced Practice Provider:  No care team member to display Electrophysiologist:  None     Chief Complaint    Diamond [name] is a 71 y.o. female with a hx of ESRD on HD, asthma, HTN, anemia of chronic disease, atrial flutter, endocarditis presents today for hospital follow up.    Past Medical History    Past Medical History:  Diagnosis Date   Asthma    Atrial flutter with rapid ventricular response ([place]) 12/25/2021   Chronic kidney disease    dialysis Tues Thurs Sat   Dysrhythmia    PAF 10/2019 in setting of COVID-19 PNA   Gout    Hypertension    Past Surgical History:  Procedure Laterality Date   AV FISTULA PLACEMENT Left 12/23/2021   Procedure: LEFT ARM BRACHIOBASILIC VEIN ARTERIOVENOUS (AV) FISTULA CREATION;  Surgeon: [name], MD;  Location: [place];  Service: Vascular;  Laterality: Left;   [place] Right 12/23/2021   Procedure: INSERTION OF DIALYSIS CATHETER;  Surgeon: [name], MD;  Location: [place];  Service: Vascular;  Laterality: Right;   TEE WITHOUT CARDIOVERSION N/A 12/22/2021   Procedure: TRANSESOPHAGEAL ECHOCARDIOGRAM (TEE);  Surgeon: Jerline Pain, MD;  Location: [facility] ENDOSCOPY;  Service: Cardiovascular;  Laterality: N/A;   ULTRASOUND GUIDANCE FOR VASCULAR ACCESS  12/23/2021   Procedure: ULTRASOUND GUIDANCE FOR VASCULAR ACCESS;  Surgeon: [name], MD;  Location: [place];  Service: Vascular;;    Allergies  No Known Allergies  History of Present Illness    Diamond [name] is a 71 y.o. female with a hx of  ESRD on HD, asthma, HTN, anemia of chronic disease, atrial flutter, endocarditis  last seen while hospitalized.  Admitted 12/16/21-12/30/21 due to MRSA bacteremia with hospital course complicated by atrial flutter  and bleeding from HD catheter site. TEE 12/22/21 with right atrial/IVC globular mobile mass compatible with thrombotic/vegetative material. Plan for IV abx through 02/13/22. Inadvertent finding of lung nodule recommended for repeat CT in 3 mos.   Presents today for follow up with her sister. She has felt overall well since discharge. She is at [facility] and hoping to return home independently soon. Goes to HD T/R/Sa and can complete abx there. Reports no shortness of breath nor dyspnea on exertion. Reports no chest pain, pressure, or tightness. No edema, orthopnea, PND. Reports no palpitations.  BP at HD most often in 130s. We reviewed hospitalization and cardiac testing in detail as well as pathophysiology of atrial flutter.   EKGs/Labs/Other Studies Reviewed:   The following studies were reviewed today:  TEE: 12/22/2021  1. There is a 2.1 x 1.37 mutilobulated mobile mass attached to the RA/IVC junction which has the appearance of thrombus/vegetation in the setting of bacteremia and recently removed dialysis catheter.   2. Left ventricular ejection fraction, by estimation, is 60 to 65%. The  left ventricle has normal function. The left ventricle has no regional  wall motion abnormalities.   3. Right ventricular systolic function is normal. The right ventricular  size is normal.   4. No left atrial/left atrial appendage thrombus was detected.   5. No mitral valve vegetation. The mitral valve is degenerative. Trivial  mitral valve regurgitation. No evidence of mitral stenosis.   6.  The aortic valve is normal in structure. Aortic valve regurgitation is  not visualized. No aortic stenosis is present.   7. The inferior vena cava is normal in size with greater than 50%  respiratory variability, suggesting right atrial pressure of 3 mmHg.   Echo 12/18/21  1. Left ventricular ejection fraction, by estimation, is 60 to 65%. The  left ventricle has normal function. The left ventricle has no  regional  wall motion abnormalities. There is moderate hypertrophy of the basal  septum. The rest of the LV segments  demonstrate mild left ventricular hypertrophy. Left ventricular diastolic  parameters are consistent with Grade I diastolic dysfunction (impaired  relaxation).   2. Right ventricular systolic function is normal. The right ventricular  size is normal. There is normal pulmonary artery systolic pressure.   3. The mitral valve leaflets are thickened. There are fibrinous densities  visualized on the anterior mitral valve leaflet on the apical 3C view.  Given underlying bacteremia, findings concerning for vegetation formation  (versus degenerative valve  changes). There is trivial mitral regurgitation and no evidence of valve  destruction. Recommend TEE for further evaluation.   4. The aortic valve is tricuspid. There is mild calcification of the  aortic valve. There is moderate thickening of the aortic valve. Aortic  valve regurgitation is trivial. Aortic valve sclerosis/calcification is  present, without any evidence of aortic  stenosis.   5. Aortic dilatation noted. There is mild dilatation of the ascending  aorta, measuring 40 mm.   6. There is a highly mobile, fibrinous structure visualized in the RA  most likely a eustachian valve or chiari network.   Comparison(s): Compared to prior TTE in 11/2019, the mitral valve leaflets  continue to be thickened, however, there is concern for possible  vegetation on current study. Otherwise, the TR appears mild (previously  mod-to-severe).   Conclusion(s)/Recommendation(s): Findings concerning for mitral valve  vegetation, would recommend a Transesophageal Echocardiogram for  clarification.   EKG:  EKG is ordered today.  The ekg ordered today demonstrates NSR 80 bpm with RBBB and occasional supraventricular complex.   Recent Labs: 12/17/2021: Magnesium 1.9 12/26/2021: ALT 8 12/28/2021: BUN 46; Creatinine, Ser 6.72; Potassium  4.3; Sodium 127 12/30/2021: Hemoglobin 10.6; Platelets 163  Recent Lipid Panel    Component Value Date/Time   CHOL 188 02/23/2011 0431   TRIG 122 02/23/2011 0431   HDL 84 02/23/2011 0431   CHOLHDL 2.2 Ratio 02/23/2011 0431   VLDL 24 02/23/2011 0431   LDLCALC 80 02/23/2011 0431    Risk Assessment/Calculations:   CHA2DS2-VASc Score = 4   This indicates a 4.8% annual risk of stroke. The patient's score is based upon: CHF History: 0 HTN History: 1 Diabetes History: 0 Stroke History: 0 Vascular Disease History: 1 Age Score: 1 Gender Score: 1   Home Medications   Current Meds  Medication Sig   acetaminophen (TYLENOL) 325 MG tablet Take 2 tablets (650 mg total) by mouth every 6 (six) hours as needed for mild pain (or Fever >/= 101).   albuterol (VENTOLIN HFA) 108 (90 Base) MCG/ACT inhaler Inhale 2 puffs into the lungs every 4 (four) hours as needed for wheezing or shortness of breath.   allopurinol (ZYLOPRIM) 100 MG tablet Take 200 mg by mouth daily.   apixaban (ELIQUIS) 5 MG TABS tablet Take 1 tablet (5 mg total) by mouth 2 (two) times daily.   colchicine 0.6 MG tablet Take 0.6 mg by mouth daily as needed (gout).   cyclobenzaprine (FLEXERIL) 5  MG tablet Take 5 mg by mouth daily. Pt take 0.5 tablet one a day.   GABAPENTIN PO Take 100 mg by mouth at bedtime.   hydrALAZINE (APRESOLINE) 50 MG tablet Take 1 tablet (50 mg total) by mouth every 8 (eight) hours.   lidocaine (LIDODERM) 5 % Place 1 patch onto the skin daily before breakfast. Remove & Discard patch within 12 hours or as directed by MD   Lidocaine-Menthol 4-5 % PTCH Apply topically.   metoprolol tartrate (LOPRESSOR) 50 MG tablet Take 1 tablet (50 mg total) by mouth 2 (two) times daily.   ondansetron (ZOFRAN) 4 MG tablet Take 4 mg by mouth every 8 (eight) hours as needed.   oxyCODONE (OXY IR/ROXICODONE) 5 MG immediate release tablet Take 1 tablet (5 mg total) by mouth every 6 (six) hours as needed for severe pain.    polyethylene glycol (MIRALAX / GLYCOLAX) 17 g packet Take 17 g by mouth daily as needed for mild constipation.   senna-docusate (SENOKOT-S) 8.6-50 MG tablet Take 2 tablets by mouth 2 (two) times daily.   sevelamer carbonate (RENVELA) 800 MG tablet Take 2 tablets (1,600 mg total) by mouth 3 (three) times daily with meals.   vancomycin (VANCOREADY) 500 MG/100ML IVPB Inject 100 mLs (500 mg total) into the vein Every Tuesday,Thursday,and Saturday with dialysis.     Review of Systems     All other systems reviewed and are otherwise negative except as noted above.  Physical Exam    VS:  BP (!) 150/90    Pulse 80    Ht 5\' 7"  (1.702 m)    Wt 143 lb (64.9 kg)    SpO2 99%    BMI 22.40 kg/m  , BMI Body mass index is 22.4 kg/m.  Wt Readings from Last 3 Encounters:  01/13/22 143 lb (64.9 kg)  12/29/21 143 lb 4.8 oz (65 kg)  09/25/21 153 lb (69.4 kg)     GEN: Well nourished, well developed, in no acute distress. HEENT: normal. Neck: Supple, no JVD, carotid bruits, or masses. Cardiac: RRR, no murmurs, rubs, or gallops. No clubbing, cyanosis, edema.  Radials/PT 2+ and equal bilaterally.  Respiratory:  Respirations regular and unlabored, clear to auscultation bilaterally. GI: Soft, nontender, nondistended. MS: No deformity or atrophy. Skin: Warm and dry, no rash. Neuro:  Strength and sensation are intact. Psych: Normal affect.  Assessment & Plan    Atrial flutter - EKG today NSR. Denies palpitations. Continue Metoprolol tartrate 50mg  BID. Continue Eliquis 5mg  BID. Denies bleeding complications. Does not meet dose reduction criteria. CHA2DS2-VASc Score = 4 [CHF History: 0, HTN History: 1, Diabetes History: 0, Stroke History: 0, Vascular Disease History: 1, Age Score: 1, Gender Score: 1].  Therefore, the patient's annual risk of stroke is 4.8 %.      Right atrial mass with suspected thrombus vs vegetation - TTE 12/22/21 suspicious for thrombus given occurred at prior HD site vs vegetation given MRSA  bacteremia. Plan to repeat echo after she has completed abx 02/13/22 to ensure resolution  - ordered today.   MRSA bacteremia with L5-S1 discitis - TEE without obvious valvular vegetations with evidence of RA/IVC junction ass. Likely embolic due to endocarditis. Update echo after completion of abx, as above.   ESRD on HD / Anemia of chronic disease - Follows with nephrology. On HD T/R/Sa.  HTN - BP not at goal in clinic though notes most often in 130s at HD. She will keep log and let us know if consistently high. If  so, consider up titration of Hydralazine.   Lung nodule - Incidental finding while admitted. Needs repeat CT chest in 3 mos. If not coordinated already by PCP, consider ordering at follow up.   Disposition: Follow up in 2 month(s) with Freada Bergeron, MD or APP.  Signed, Loel Dubonnet, NP 01/13/2022, 4:57 PM [facility]

## 2022-01-14 DIAGNOSIS — Z992 Dependence on renal dialysis: Secondary | ICD-10-CM | POA: Diagnosis not present

## 2022-01-14 DIAGNOSIS — N186 End stage renal disease: Secondary | ICD-10-CM | POA: Diagnosis not present

## 2022-01-14 DIAGNOSIS — N2581 Secondary hyperparathyroidism of renal origin: Secondary | ICD-10-CM | POA: Diagnosis not present

## 2022-01-15 DIAGNOSIS — B1920 Unspecified viral hepatitis C without hepatic coma: Secondary | ICD-10-CM | POA: Insufficient documentation

## 2022-01-16 ENCOUNTER — Encounter (HOSPITAL_COMMUNITY): Payer: Self-pay

## 2022-01-16 ENCOUNTER — Emergency Department (HOSPITAL_COMMUNITY): Payer: Medicare HMO

## 2022-01-16 ENCOUNTER — Inpatient Hospital Stay (HOSPITAL_COMMUNITY)
Admission: EM | Admit: 2022-01-16 | Discharge: 2022-01-22 | DRG: 377 | Disposition: A | Discharge status: Home Health | Payer: Medicare HMO | Source: Other Acute Inpatient Hospital | Attending: Internal Medicine | Admitting: Internal Medicine

## 2022-01-16 ENCOUNTER — Other Ambulatory Visit: Payer: Self-pay

## 2022-01-16 DIAGNOSIS — M4627 Osteomyelitis of vertebra, lumbosacral region: Secondary | ICD-10-CM | POA: Diagnosis present

## 2022-01-16 DIAGNOSIS — K921 Melena: Secondary | ICD-10-CM | POA: Diagnosis not present

## 2022-01-16 DIAGNOSIS — I4892 Unspecified atrial flutter: Secondary | ICD-10-CM | POA: Diagnosis present

## 2022-01-16 DIAGNOSIS — B3781 Candidal esophagitis: Secondary | ICD-10-CM | POA: Diagnosis present

## 2022-01-16 DIAGNOSIS — K838 Other specified diseases of biliary tract: Secondary | ICD-10-CM | POA: Diagnosis not present

## 2022-01-16 DIAGNOSIS — K828 Other specified diseases of gallbladder: Secondary | ICD-10-CM

## 2022-01-16 DIAGNOSIS — M4646 Discitis, unspecified, lumbar region: Secondary | ICD-10-CM | POA: Diagnosis present

## 2022-01-16 DIAGNOSIS — Z801 Family history of malignant neoplasm of trachea, bronchus and lung: Secondary | ICD-10-CM

## 2022-01-16 DIAGNOSIS — K8020 Calculus of gallbladder without cholecystitis without obstruction: Secondary | ICD-10-CM | POA: Diagnosis present

## 2022-01-16 DIAGNOSIS — D62 Acute posthemorrhagic anemia: Secondary | ICD-10-CM | POA: Diagnosis present

## 2022-01-16 DIAGNOSIS — D649 Anemia, unspecified: Secondary | ICD-10-CM

## 2022-01-16 DIAGNOSIS — Z992 Dependence on renal dialysis: Secondary | ICD-10-CM

## 2022-01-16 DIAGNOSIS — N186 End stage renal disease: Secondary | ICD-10-CM | POA: Diagnosis present

## 2022-01-16 DIAGNOSIS — K829 Disease of gallbladder, unspecified: Secondary | ICD-10-CM | POA: Diagnosis present

## 2022-01-16 DIAGNOSIS — I10 Essential (primary) hypertension: Secondary | ICD-10-CM | POA: Diagnosis present

## 2022-01-16 DIAGNOSIS — K635 Polyp of colon: Secondary | ICD-10-CM | POA: Diagnosis present

## 2022-01-16 DIAGNOSIS — K8066 Calculus of gallbladder and bile duct with acute and chronic cholecystitis without obstruction: Secondary | ICD-10-CM | POA: Diagnosis not present

## 2022-01-16 DIAGNOSIS — D631 Anemia in chronic kidney disease: Secondary | ICD-10-CM | POA: Diagnosis present

## 2022-01-16 DIAGNOSIS — R911 Solitary pulmonary nodule: Secondary | ICD-10-CM | POA: Diagnosis present

## 2022-01-16 DIAGNOSIS — R7881 Bacteremia: Secondary | ICD-10-CM | POA: Diagnosis present

## 2022-01-16 DIAGNOSIS — M545 Low back pain, unspecified: Secondary | ICD-10-CM | POA: Diagnosis not present

## 2022-01-16 DIAGNOSIS — M898X9 Other specified disorders of bone, unspecified site: Secondary | ICD-10-CM | POA: Diagnosis present

## 2022-01-16 DIAGNOSIS — K2981 Duodenitis with bleeding: Secondary | ICD-10-CM | POA: Diagnosis present

## 2022-01-16 DIAGNOSIS — K922 Gastrointestinal hemorrhage, unspecified: Secondary | ICD-10-CM | POA: Diagnosis not present

## 2022-01-16 DIAGNOSIS — J189 Pneumonia, unspecified organism: Secondary | ICD-10-CM

## 2022-01-16 DIAGNOSIS — M109 Gout, unspecified: Secondary | ICD-10-CM | POA: Diagnosis present

## 2022-01-16 DIAGNOSIS — G8929 Other chronic pain: Secondary | ICD-10-CM | POA: Diagnosis present

## 2022-01-16 DIAGNOSIS — I120 Hypertensive chronic kidney disease with stage 5 chronic kidney disease or end stage renal disease: Secondary | ICD-10-CM | POA: Diagnosis present

## 2022-01-16 DIAGNOSIS — N250 Renal osteodystrophy: Secondary | ICD-10-CM | POA: Diagnosis not present

## 2022-01-16 DIAGNOSIS — I330 Acute and subacute infective endocarditis: Secondary | ICD-10-CM | POA: Diagnosis present

## 2022-01-16 DIAGNOSIS — M549 Dorsalgia, unspecified: Secondary | ICD-10-CM | POA: Diagnosis not present

## 2022-01-16 DIAGNOSIS — Z8616 Personal history of COVID-19: Secondary | ICD-10-CM | POA: Diagnosis not present

## 2022-01-16 DIAGNOSIS — Z8249 Family history of ischemic heart disease and other diseases of the circulatory system: Secondary | ICD-10-CM | POA: Diagnosis not present

## 2022-01-16 DIAGNOSIS — B9562 Methicillin resistant Staphylococcus aureus infection as the cause of diseases classified elsewhere: Secondary | ICD-10-CM | POA: Diagnosis present

## 2022-01-16 DIAGNOSIS — N2581 Secondary hyperparathyroidism of renal origin: Secondary | ICD-10-CM | POA: Diagnosis present

## 2022-01-16 DIAGNOSIS — R195 Other fecal abnormalities: Secondary | ICD-10-CM | POA: Diagnosis not present

## 2022-01-16 DIAGNOSIS — K2289 Other specified disease of esophagus: Secondary | ICD-10-CM | POA: Diagnosis not present

## 2022-01-16 DIAGNOSIS — K644 Residual hemorrhoidal skin tags: Secondary | ICD-10-CM | POA: Diagnosis present

## 2022-01-16 DIAGNOSIS — R109 Unspecified abdominal pain: Secondary | ICD-10-CM | POA: Diagnosis not present

## 2022-01-16 DIAGNOSIS — R1084 Generalized abdominal pain: Secondary | ICD-10-CM | POA: Diagnosis not present

## 2022-01-16 DIAGNOSIS — D125 Benign neoplasm of sigmoid colon: Secondary | ICD-10-CM | POA: Diagnosis not present

## 2022-01-16 DIAGNOSIS — A419 Sepsis, unspecified organism: Secondary | ICD-10-CM | POA: Diagnosis not present

## 2022-01-16 DIAGNOSIS — E876 Hypokalemia: Secondary | ICD-10-CM | POA: Diagnosis present

## 2022-01-16 DIAGNOSIS — K3189 Other diseases of stomach and duodenum: Secondary | ICD-10-CM | POA: Diagnosis not present

## 2022-01-16 DIAGNOSIS — K8689 Other specified diseases of pancreas: Secondary | ICD-10-CM | POA: Diagnosis not present

## 2022-01-16 DIAGNOSIS — R9431 Abnormal electrocardiogram [ECG] [EKG]: Secondary | ICD-10-CM | POA: Diagnosis not present

## 2022-01-16 DIAGNOSIS — N281 Cyst of kidney, acquired: Secondary | ICD-10-CM | POA: Diagnosis not present

## 2022-01-16 DIAGNOSIS — K2980 Duodenitis without bleeding: Secondary | ICD-10-CM

## 2022-01-16 DIAGNOSIS — Z800 Family history of malignant neoplasm of digestive organs: Secondary | ICD-10-CM

## 2022-01-16 LAB — CBC WITH DIFFERENTIAL/PLATELET
Abs Immature Granulocytes: 0.02 10*3/uL (ref 0.00–0.07)
Basophils Absolute: 0 10*3/uL (ref 0.0–0.1)
Basophils Relative: 1 %
Eosinophils Absolute: 0.1 10*3/uL (ref 0.0–0.5)
Eosinophils Relative: 3 %
HCT: 26.6 % — ABNORMAL LOW (ref 36.0–46.0)
Hemoglobin: 8.1 g/dL — ABNORMAL LOW (ref 12.0–15.0)
Immature Granulocytes: 1 %
Lymphocytes Relative: 19 %
Lymphs Abs: 0.8 10*3/uL (ref 0.7–4.0)
MCH: 30 pg (ref 26.0–34.0)
MCHC: 30.5 g/dL (ref 30.0–36.0)
MCV: 98.5 fL (ref 80.0–100.0)
Monocytes Absolute: 0.4 10*3/uL (ref 0.1–1.0)
Monocytes Relative: 10 %
Neutro Abs: 2.9 10*3/uL (ref 1.7–7.7)
Neutrophils Relative %: 66 %
Platelets: 249 10*3/uL (ref 150–400)
RBC: 2.7 MIL/uL — ABNORMAL LOW (ref 3.87–5.11)
RDW: 15.6 % — ABNORMAL HIGH (ref 11.5–15.5)
WBC: 4.3 10*3/uL (ref 4.0–10.5)
nRBC: 0 % (ref 0.0–0.2)

## 2022-01-16 LAB — PROTIME-INR
INR: 1.3 — ABNORMAL HIGH (ref 0.8–1.2)
Prothrombin Time: 15.9 seconds — ABNORMAL HIGH (ref 11.4–15.2)

## 2022-01-16 LAB — COMPREHENSIVE METABOLIC PANEL
ALT: 11 U/L (ref 0–44)
AST: 17 U/L (ref 15–41)
Albumin: 2.3 g/dL — ABNORMAL LOW (ref 3.5–5.0)
Alkaline Phosphatase: 114 U/L (ref 38–126)
Anion gap: 12 (ref 5–15)
BUN: 9 mg/dL (ref 8–23)
CO2: 30 mmol/L (ref 22–32)
Calcium: 8.5 mg/dL — ABNORMAL LOW (ref 8.9–10.3)
Chloride: 95 mmol/L — ABNORMAL LOW (ref 98–111)
Creatinine, Ser: 2.26 mg/dL — ABNORMAL HIGH (ref 0.44–1.00)
GFR, Estimated: 23 mL/min — ABNORMAL LOW (ref >60)
Glucose, Bld: 84 mg/dL (ref 70–99)
Potassium: 3.1 mmol/L — ABNORMAL LOW (ref 3.5–5.1)
Sodium: 137 mmol/L (ref 135–145)
Total Bilirubin: 0.4 mg/dL (ref 0.3–1.2)
Total Protein: 7 g/dL (ref 6.5–8.1)

## 2022-01-16 LAB — RESP PANEL BY RT-PCR (FLU A&B, COVID) ARPGX2
Influenza A by PCR: NEGATIVE
Influenza B by PCR: NEGATIVE
SARS Coronavirus 2 by RT PCR: NEGATIVE

## 2022-01-16 LAB — POC OCCULT BLOOD, ED: Fecal Occult Bld: POSITIVE — AB

## 2022-01-16 LAB — LIPASE, BLOOD: Lipase: 42 U/L (ref 11–51)

## 2022-01-16 LAB — LACTIC ACID, PLASMA: Lactic Acid, Venous: 1.7 mmol/L (ref 0.5–1.9)

## 2022-01-16 LAB — APTT: aPTT: 37 seconds — ABNORMAL HIGH (ref 24–36)

## 2022-01-16 MED ORDER — SEVELAMER CARBONATE 800 MG PO TABS
1600.0000 mg | ORAL_TABLET | Freq: Three times a day (TID) | ORAL | Status: DC
  Administered 2022-01-17 – 2022-01-22 (×13): 1600 mg via ORAL
  Filled 2022-01-16 (×13): qty 2

## 2022-01-16 MED ORDER — METOPROLOL TARTRATE 25 MG PO TABS
50.0000 mg | ORAL_TABLET | Freq: Two times a day (BID) | ORAL | Status: DC
  Administered 2022-01-17 (×2): 50 mg via ORAL
  Filled 2022-01-16 (×2): qty 2

## 2022-01-16 MED ORDER — VANCOMYCIN HCL 500 MG/100ML IV SOLN: 500.0000 mg | INTRAVENOUS | Status: DC

## 2022-01-16 MED ORDER — ONDANSETRON HCL 4 MG PO TABS: 4.0000 mg | ORAL_TABLET | Freq: Four times a day (QID) | ORAL | Status: DC | PRN

## 2022-01-16 MED ORDER — CYCLOBENZAPRINE HCL 5 MG PO TABS
2.5000 mg | ORAL_TABLET | Freq: Every day | ORAL | Status: DC
  Administered 2022-01-17 – 2022-01-22 (×7): 2.5 mg via ORAL
  Filled 2022-01-16: qty 0.5
  Filled 2022-01-16 (×6): qty 1

## 2022-01-16 MED ORDER — POLYETHYLENE GLYCOL 3350 17 G PO PACK: 17.0000 g | PACK | Freq: Every day | ORAL | Status: DC | PRN

## 2022-01-16 MED ORDER — ACETAMINOPHEN 325 MG PO TABS
650.0000 mg | ORAL_TABLET | Freq: Four times a day (QID) | ORAL | Status: DC | PRN
  Administered 2022-01-17 – 2022-01-22 (×2): 650 mg via ORAL
  Filled 2022-01-16 (×3): qty 2

## 2022-01-16 MED ORDER — ACETAMINOPHEN 650 MG RE SUPP: 650.0000 mg | Freq: Four times a day (QID) | RECTAL | Status: DC | PRN

## 2022-01-16 MED ORDER — ALLOPURINOL 100 MG PO TABS
200.0000 mg | ORAL_TABLET | Freq: Every day | ORAL | Status: DC
  Administered 2022-01-17 – 2022-01-22 (×6): 200 mg via ORAL
  Filled 2022-01-16 (×6): qty 2

## 2022-01-16 MED ORDER — SENNOSIDES-DOCUSATE SODIUM 8.6-50 MG PO TABS
2.0000 | ORAL_TABLET | Freq: Two times a day (BID) | ORAL | Status: DC
  Administered 2022-01-17 – 2022-01-22 (×8): 2 via ORAL
  Filled 2022-01-16 (×10): qty 2

## 2022-01-16 MED ORDER — ALBUTEROL SULFATE (2.5 MG/3ML) 0.083% IN NEBU: 3.0000 mL | INHALATION_SOLUTION | RESPIRATORY_TRACT | Status: DC | PRN

## 2022-01-16 MED ORDER — IOHEXOL 350 MG/ML SOLN
93.0000 mL | Freq: Once | INTRAVENOUS | Status: AC | PRN
  Administered 2022-01-16: 93 mL via INTRAVENOUS

## 2022-01-16 MED ORDER — GABAPENTIN 100 MG PO CAPS
100.0000 mg | ORAL_CAPSULE | Freq: Every day | ORAL | Status: DC
  Administered 2022-01-17 – 2022-01-21 (×6): 100 mg via ORAL
  Filled 2022-01-16 (×6): qty 1

## 2022-01-16 MED ORDER — ONDANSETRON HCL 4 MG/2ML IJ SOLN
4.0000 mg | Freq: Four times a day (QID) | INTRAMUSCULAR | Status: DC | PRN
  Administered 2022-01-18: 4 mg via INTRAVENOUS
  Filled 2022-01-16: qty 2

## 2022-01-16 NOTE — H&P (Signed)
History and Physical    Patient: Diamond [name] MRN: [id number] DOA: 01/16/2022  Date of Service: the patient was seen and examined on 01/17/2022  Patient coming from:  dialysis center via EMS  Chief Complaint:  Chief Complaint  Patient presents with   Abdominal Pain    HPI:   With past medical history of end-stage renal disease (HD Tues, Thurs, Sat), asthma, hypertension, anemia of chronic disease, atrial flutter (on eliquis) and recent diagnosis of suspected infective endocarditis with right atrial mass as well as MRSA bacteremia with lumbar discitis who presents to [facility] emergency department via EMS with complaints of abdominal pain.  Patient recently hospitalized at [facility] from 1/11 until 1/25.  Patient initially presented with back pain with clinical concern for sepsis.  Shortly after hospitalization, patient was identified to have MRSA bacteremia, L5-S1 discitis with TEE identifying the right atrial/IVC mass thought to be thrombus versus vegetation.  Hospital course was additionally complicated by new onset atrial flutter.  Patient comanaged by infectious disease and cardiology, with initiation of intravenous vancomycin for total of 6 weeks.  Patient was discharged to skilled nursing facility on 1/25.  Patient explains that for the past several weeks she has been experiencing continued low back pain and abdominal pain.  Pain was initially mild in intensity but progressively has become more more severe.  Pain has been waxing and waning in quality.  Patient presented to dialysis earlier in the day on 2/11.  During the patient's dialysis session, the dialysis nurse stopped dialysis due to concerns for the patient being septic.    After dialysis was stopped EMS was contacted who promptly came to evaluate the patient.  Patient was then brought into [facility] emergency department for evaluation.  Upon evaluation in the emergency department CT angiogram of  the abdomen pelvis was performed revealing no evidence of active gastrointestinal bleeding.  There was evidence of cholelithiasis with gallbladder wall edema however ER provider felt patient clinically did not have cholecystitis.  Hemoglobin was noted to have dropped from 10.62 weeks ago to 8.1 on this presentation.  On rectal exam there is no gross blood however stool Hemoccult was found to be positive.  Due to concerns for gastrointestinal bleeding the hospitalist group was then called to assist the patient for admission to the hospital.  Review of Systems: Review of Systems  Gastrointestinal:  Positive for abdominal pain.  Musculoskeletal:  Positive for back pain.  All other systems reviewed and are negative.   Past Medical History:  Diagnosis Date   Asthma    Atrial flutter with rapid ventricular response ([place]) 12/25/2021   Chronic kidney disease    dialysis Tues Thurs Sat   Dysrhythmia    PAF 10/2019 in setting of COVID-19 PNA   Gout    Hypertension     Past Surgical History:  Procedure Laterality Date   AV FISTULA PLACEMENT Left 12/23/2021   Procedure: LEFT ARM BRACHIOBASILIC VEIN ARTERIOVENOUS (AV) FISTULA CREATION;  Surgeon: [name], MD;  Location: [place];  Service: Vascular;  Laterality: Left;   [place] Right 12/23/2021   Procedure: INSERTION OF DIALYSIS CATHETER;  Surgeon: [name], MD;  Location: [place];  Service: Vascular;  Laterality: Right;   TEE WITHOUT CARDIOVERSION N/A 12/22/2021   Procedure: TRANSESOPHAGEAL ECHOCARDIOGRAM (TEE);  Surgeon: Jerline Pain, MD;  Location: [facility] ENDOSCOPY;  Service: Cardiovascular;  Laterality: N/A;   ULTRASOUND GUIDANCE FOR  VASCULAR ACCESS  12/23/2021   Procedure: ULTRASOUND GUIDANCE FOR VASCULAR ACCESS;  Surgeon: [name], MD;  Location: [place];  Service: Vascular;;    Social History:  reports that she has never smoked. She has never used smokeless tobacco. She reports that  she does not drink alcohol and does not use drugs.  No Known Allergies  Family History  Problem Relation Age of Onset   Hypertension Mother    Hypertension Father    Colon cancer Brother    Lung cancer Brother     Prior to Admission medications   Medication Sig Start Date End Date Taking? Authorizing Provider  acetaminophen (TYLENOL) 325 MG tablet Take 2 tablets (650 mg total) by mouth every 6 (six) hours as needed for mild pain (or Fever >/= 101). 12/30/21  Yes Ghimire, Henreitta Leber, MD  albuterol (VENTOLIN HFA) 108 (90 Base) MCG/ACT inhaler Inhale 2 puffs into the lungs every 4 (four) hours as needed for wheezing or shortness of breath. 10/08/19  Yes [provider]  allopurinol (ZYLOPRIM) 100 MG tablet Take 200 mg by mouth daily. 12/03/21  Yes [provider]  cyclobenzaprine (FLEXERIL) 5 MG tablet Take 5 mg by mouth daily. Pt take 0.5 tablet one a day.   Yes [provider]  GABAPENTIN PO Take 100 mg by mouth at bedtime.   Yes [provider]  lidocaine (LIDODERM) 5 % Place 1 patch onto the skin daily before breakfast. Remove & Discard patch within 12 hours or as directed by MD 12/31/21  Yes Ghimire, Henreitta Leber, MD  apixaban (ELIQUIS) 5 MG TABS tablet Take 1 tablet (5 mg total) by mouth 2 (two) times daily. Patient not taking: Reported on 01/16/2022 12/30/21   Jonetta Osgood, MD  colchicine 0.6 MG tablet Take 0.6 mg by mouth daily as needed (gout). Patient not taking: Reported on 01/16/2022    [provider]  hydrALAZINE (APRESOLINE) 50 MG tablet Take 1 tablet (50 mg total) by mouth every 8 (eight) hours. Patient not taking: Reported on 01/16/2022 12/30/21   Jonetta Osgood, MD  Lidocaine-Menthol 4-5 % PTCH Apply topically. 12/31/21   [provider]  metoprolol tartrate (LOPRESSOR) 50 MG tablet Take 1 tablet (50 mg total) by mouth 2 (two) times daily. 12/30/21   Ghimire, Henreitta Leber, MD  ondansetron (ZOFRAN) 4 MG tablet Take 4 mg by mouth  every 8 (eight) hours as needed. 09/17/21   [provider]  oxyCODONE (OXY IR/ROXICODONE) 5 MG immediate release tablet Take 1 tablet (5 mg total) by mouth every 6 (six) hours as needed for severe pain. 12/30/21   Ghimire, Henreitta Leber, MD  polyethylene glycol (MIRALAX / GLYCOLAX) 17 g packet Take 17 g by mouth daily as needed for mild constipation. 12/30/21   Ghimire, Henreitta Leber, MD  senna-docusate (SENOKOT-S) 8.6-50 MG tablet Take 2 tablets by mouth 2 (two) times daily. 12/30/21   Ghimire, Henreitta Leber, MD  sevelamer carbonate (RENVELA) 800 MG tablet Take 2 tablets (1,600 mg total) by mouth 3 (three) times daily with meals. 12/30/21   Ghimire, Henreitta Leber, MD  vancomycin (VANCOREADY) 500 MG/100ML IVPB Inject 100 mLs (500 mg total) into the vein Every Tuesday,Thursday,and Saturday with dialysis. 12/31/21 02/13/22  Jonetta Osgood, MD    Physical Exam:  Vitals:   01/16/22 1900 01/16/22 1915 01/16/22 2300 01/17/22 0136  BP: (!) 173/110 (!) 166/116  (!) 185/95  Pulse: 93 91 (!) 102   Resp: 20 19 16    SpO2: 100%  100% 99%   Weight:      Height:         Constitutional: Awake alert and oriented x3, no associated distress.   Skin: no rashes, no lesions, good skin turgor noted. Eyes: Pupils are equally reactive to light.  No evidence of scleral icterus or conjunctival pallor.  ENMT: Moist mucous membranes noted.  Posterior pharynx clear of any exudate or lesions.   Neck: normal, supple, no masses, no thyromegaly.  No evidence of jugular venous distension.   Respiratory: clear to auscultation bilaterally, no wheezing, no crackles. Normal respiratory effort. No accessory muscle use.  Cardiovascular: Regular rate and rhythm, no murmurs / rubs / gallops. No extremity edema. 2+ pedal pulses. No carotid bruits.  Chest:   Nontender without crepitus or deformity.   Back:   Nontender without crepitus or deformity. Abdomen: Abdomen is soft and nontender.  No evidence of intra-abdominal masses.  Positive  bowel sounds noted in all quadrants.   Musculoskeletal: No joint deformity upper and lower extremities. Good ROM, no contractures. Normal muscle tone.  Neurologic: CN 2-12 grossly intact. Sensation intact.  Patient moving all 4 extremities spontaneously.  Patient is following all commands.  Patient is responsive to verbal stimuli.   Psychiatric: Patient exhibits normal mood with appropriate affect.  Patient seems to possess insight as to their current situation.    Data Reviewed:  I have personally reviewed and interpreted labs, imaging.  Significant findings are:  CT imaging of the abdomen and pelvis reveals masslike thickening of the gallbladder fundus measuring 3.1 x 3.4 cm. Potassium is 3.1   EKG: Personally reviewed.  Rhythm is sinus tachycardia with heart rate of 99 bpm.  No dynamic ST segment changes appreciated.    Assessment and Plan: * Acute blood loss anemia- (present on admission) While patient initially presented due to ongoing back and abdominal pain, patient has been identified to have a significant drop in hemoglobin on initial evaluation Hemoglobin currently 8.1, down from 10.6 three weeks ago Patient denies any melena or frank bleeding Stool Hemoccult performed in the emergency department is positive While patient was recently prescribed Eliquis during last hospitalization and it is noted on patient's discharge summary patient denies ever having been prescribed this drug has yet to take it. CT angiogram of the abdomen and pelvis does not reveal any evidence of active bleeding. Performing serial CBCs We will transfuse and obtain GI consultation if hemoglobin drops below 7 Since there is no evidence of gross clinically substantial bleeding at this point will resume Eliquis at this time to ensure that patient can safely tolerate this drug prior to discharge.   Acute infective endocarditis- (present on admission) Recent diagnosis during Recent hospitalization from 1/11  until 9/74 Complicated by right atrial mass/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 as well as evidence of discitis and osteomyelitis of L5-S1 thought to be secondary to embolic phenomenon of the infective endocarditis. Currently receiving IV vancomycin with hemodialysis through 3/11 Patient was also to be receiving Eliquis after recent discharge but denies ever receiving this drug Despite patient's complaints of ongoing low back pain radiating along the right flank around to the abdomen, patient is not exhibiting any evidence of leukocytosis or fever on arrival. Obtaining repeat blood cultures to assess for persistent MRSA bacteremia Obtaining inflammatory markers If patient exhibits evidence of persisting infection will consider further workup.   Septic discitis of lumbar region- (present on admission) Please see assessment and plan above.  Atrial flutter ([place])- (present on admission) Currently rate controlled As mentioned  above, resuming previously prescribed (but not taken) regimen of Eliquis to ensure the patient can safely tolerate this without significant clinical bleeding. Continue home regimen of metoprolol Monitoring on telemetry    ESRD on hemodialysis [facility]) Patient receives hemodialysis in the outpatient setting on Tuesday Thursday Saturday If patient is to remain in the hospital on Tuesday we will consult nephrology for resumption of hemodialysis  Essential hypertension- (present on admission) Resume patients home regimen of oral antihypertensives Titrate antihypertensive regimen as necessary to achieve adequate BP control PRN intravenous antihypertensives for excessively elevated blood pressure    Hypokalemia- (present on admission) Conservative management of this mild hypokalemia considering end-stage renal disease   Gallbladder mass CT imaging of the abdomen and pelvis once again has identified an abnormality of the gallbladder with some degree of gallbladder thickening seen since 09/2021.    This time, imaging, reveals a 3.1 x 3.4 cm masslike thickening of the gallbladder fundus.  Gallbladder malignancy is possible. Right upper quadrant nontender LFTs unremarkable Obtaining MRCP to evaluate further.       Code Status:  Full code  code status decision has been confirmed with: patient Family Communication: deferred   Consults: None  Severity of Illness:  The appropriate patient status for this patient is OBSERVATION. Observation status is judged to be reasonable and necessary in order to provide the required intensity of service to ensure the patient's safety. The patient's presenting symptoms, physical exam findings, and initial radiographic and laboratory data in the context of their medical condition is felt to place them at decreased risk for further clinical deterioration. Furthermore, it is anticipated that the patient will be medically stable for discharge from the hospital within 2 midnights of admission.   Author:  Vernelle Emerald MD  01/17/2022 3:26 AM

## 2022-01-16 NOTE — ED Triage Notes (Addendum)
Pt BIB [facility] for eval of abd pain and back pain. Pt was at dialysis today (tues/thurs/sat) and was c/o abd pain and back pain which has been ongoing for 3 mos. Dialysis RN stopped dialysis d/t concern for "sepsis from old dialysis in her R upper chest". Pt reports concern for infection in abd d/t ongoing pain. Pt afebrile at dialysis. Currently receiving treatment through fistula in LUE. Pt reports that she was recently treated via antibiotics, but states she "feels it is not getting any better".

## 2022-01-16 NOTE — ED Notes (Signed)
Daughter Arshiya Jakes [phone] wants an update

## 2022-01-16 NOTE — ED Provider Notes (Signed)
[name] EMERGENCY DEPARTMENT Provider Note   CSN: [id number] Arrival date & time: 01/16/22  1511     History  Chief Complaint  Patient presents with   Abdominal Pain    Diamond [name] is a 71 y.o. female.  PMH of ESRD on HD presents to the ED vis with bilateral lower back pain and abdominal pain.  The patient states that the pain has been ongoing for over a month at this time.  She states that she was admitted to the hospital last month for an infection.  Chart review shows that she was admitted for severe sepsis due to MRSA bacteremia, L5-S1 discitis, endocarditis, atrial flutter with RVR. The patient is a poor historian. She was at dialysis today and the dialysis nurse became concerned that she may have developing sepsis. The patient was afebrile at the time. She does continue to make urine and denies any dysuria, hesitancy, urgency.  She is not having any chest discomfort.  No upper respiratory infection symptoms.  She denies any rashes or lesions/ulcers  HPI     Home Medications Prior to Admission medications   Medication Sig Start Date End Date Taking? Authorizing Provider  acetaminophen (TYLENOL) 325 MG tablet Take 2 tablets (650 mg total) by mouth every 6 (six) hours as needed for mild pain (or Fever >/= 101). 12/30/21   Ghimire, Henreitta Leber, MD  albuterol (VENTOLIN HFA) 108 (90 Base) MCG/ACT inhaler Inhale 2 puffs into the lungs every 4 (four) hours as needed for wheezing or shortness of breath. 10/08/19   [provider]  allopurinol (ZYLOPRIM) 100 MG tablet Take 200 mg by mouth daily. 12/03/21   [provider]  apixaban (ELIQUIS) 5 MG TABS tablet Take 1 tablet (5 mg total) by mouth 2 (two) times daily. 12/30/21   Ghimire, Henreitta Leber, MD  colchicine 0.6 MG tablet Take 0.6 mg by mouth daily as needed (gout).    [provider]  cyclobenzaprine (FLEXERIL) 5 MG tablet Take 5 mg by mouth daily. Pt take 0.5 tablet one a day.    [provider]  GABAPENTIN PO Take 100 mg by mouth at bedtime.    [provider]  hydrALAZINE (APRESOLINE) 50 MG tablet Take 1 tablet (50 mg total) by mouth every 8 (eight) hours. 12/30/21   Ghimire, Henreitta Leber, MD  lidocaine (LIDODERM) 5 % Place 1 patch onto the skin daily before breakfast. Remove & Discard patch within 12 hours or as directed by MD 12/31/21   Jonetta Osgood, MD  Lidocaine-Menthol 4-5 % PTCH Apply topically. 12/31/21   [provider]  metoprolol tartrate (LOPRESSOR) 50 MG tablet Take 1 tablet (50 mg total) by mouth 2 (two) times daily. 12/30/21   Ghimire, Henreitta Leber, MD  ondansetron (ZOFRAN) 4 MG tablet Take 4 mg by mouth every 8 (eight) hours as needed. 09/17/21   [provider]  oxyCODONE (OXY IR/ROXICODONE) 5 MG immediate release tablet Take 1 tablet (5 mg total) by mouth every 6 (six) hours as needed for severe pain. 12/30/21   Ghimire, Henreitta Leber, MD  polyethylene glycol (MIRALAX / GLYCOLAX) 17 g packet Take 17 g by mouth daily as needed for mild constipation. 12/30/21   Ghimire, Henreitta Leber, MD  senna-docusate (SENOKOT-S) 8.6-50 MG tablet Take 2 tablets by mouth 2 (two) times daily. 12/30/21   Ghimire, Henreitta Leber, MD  sevelamer carbonate (RENVELA) 800 MG tablet Take 2 tablets (1,600 mg total) by mouth 3 (three) times daily with  meals. 12/30/21   Ghimire, Henreitta Leber, MD  vancomycin (VANCOREADY) 500 MG/100ML IVPB Inject 100 mLs (500 mg total) into the vein Every Tuesday,Thursday,and Saturday with dialysis. 12/31/21 02/13/22  Ghimire, Henreitta Leber, MD      Allergies    Patient has no known allergies.    Review of Systems   Review of Systems  Constitutional:  Negative for chills, fatigue and fever.  HENT:  Negative for congestion, rhinorrhea and sore throat.   Respiratory:  Negative for chest tightness and shortness of breath.   Cardiovascular:  Negative for chest pain and palpitations.  Gastrointestinal:  Positive for abdominal pain. Negative for  constipation, diarrhea, nausea and vomiting.  Genitourinary:  Positive for flank pain. Negative for difficulty urinating, dysuria and hematuria.   Physical Exam Updated Vital Signs BP (!) 166/116    Pulse 91    Resp 19    Ht 5\' 7"  (1.702 m)    Wt 65 kg    SpO2 100%    BMI 22.44 kg/m  Physical Exam Constitutional:      Appearance: She is not toxic-appearing.  HENT:     Head: Normocephalic and atraumatic.     Mouth/Throat:     Mouth: Mucous membranes are moist.  Eyes:     Pupils: Pupils are equal, round, and reactive to light.  Cardiovascular:     Rate and Rhythm: Normal rate. Rhythm irregular.     Heart sounds: Normal heart sounds.  Pulmonary:     Effort: Pulmonary effort is normal. No respiratory distress.     Breath sounds: Normal breath sounds.  Chest:     Chest wall: No tenderness.  Abdominal:     General: Abdomen is flat. Bowel sounds are normal. There is no distension.     Palpations: Abdomen is soft.     Tenderness: There is no abdominal tenderness.  Skin:    General: Skin is warm and dry.  Neurological:     Mental Status: She is alert.    ED Results / Procedures / Treatments   Labs (all labs ordered are listed, but only abnormal results are displayed) Labs Reviewed  CBC WITH DIFFERENTIAL/PLATELET - Abnormal; Notable for the following components:      Result Value   RBC 2.70 (*)    Hemoglobin 8.1 (*)    HCT 26.6 (*)    RDW 15.6 (*)    All other components within normal limits  COMPREHENSIVE METABOLIC PANEL - Abnormal; Notable for the following components:   Potassium 3.1 (*)    Chloride 95 (*)    Creatinine, Ser 2.26 (*)    Calcium 8.5 (*)    Albumin 2.3 (*)    GFR, Estimated 23 (*)    All other components within normal limits  PROTIME-INR - Abnormal; Notable for the following components:   Prothrombin Time 15.9 (*)    INR 1.3 (*)    All other components within normal limits  APTT - Abnormal; Notable for the following components:   aPTT 37 (*)    All  other components within normal limits  POC OCCULT BLOOD, ED - Abnormal; Notable for the following components:   Fecal Occult Bld POSITIVE (*)    All other components within normal limits  CULTURE, BLOOD (ROUTINE X 2)  CULTURE, BLOOD (ROUTINE X 2)  URINE CULTURE  RESP PANEL BY RT-PCR (FLU A&B, COVID) ARPGX2  LIPASE, BLOOD  LACTIC ACID, PLASMA  URINALYSIS, ROUTINE W REFLEX MICROSCOPIC  LACTIC ACID, PLASMA    EKG  None  Radiology DG Chest Port 1 View  Result Date: 01/16/2022 CLINICAL DATA:  Sepsis EXAM: PORTABLE CHEST 1 VIEW COMPARISON:  12/26/2021 FINDINGS: Stable positioning of right IJ central line. Stable cardiomediastinal contours. No focal airspace consolidation, pleural effusion, or pneumothorax. IMPRESSION: No active disease. Electronically Signed   By: Davina Poke D.O.   On: 01/16/2022 17:24   CT Angio Abd/Pel W and/or Wo Contrast  Result Date: 01/16/2022 CLINICAL DATA:  Lower GI bleed. EXAM: CTA ABDOMEN AND PELVIS WITHOUT AND WITH CONTRAST TECHNIQUE: Multidetector CT imaging of the abdomen and pelvis was performed using the standard protocol during bolus administration of intravenous contrast. Multiplanar reconstructed images and MIPs were obtained and reviewed to evaluate the vascular anatomy. RADIATION DOSE REDUCTION: This exam was performed according to the departmental dose-optimization program which includes automated exposure control, adjustment of the mA and/or kV according to patient size and/or use of iterative reconstruction technique. CONTRAST:  55mL OMNIPAQUE IOHEXOL 350 MG/ML SOLN COMPARISON:  CT abdomen pelvis dated 12/17/2021. FINDINGS: VASCULAR Aorta: Mild atherosclerotic calcification. No aneurysmal dilatation or dissection. The aorta is tortuous. Celiac: Patent without evidence of aneurysm, dissection, vasculitis or significant stenosis. SMA: Patent without evidence of aneurysm, dissection, vasculitis or significant stenosis. Renals: Both renal arteries are patent  without evidence of aneurysm, dissection, vasculitis, fibromuscular dysplasia or significant stenosis. IMA: Patent without evidence of aneurysm, dissection, vasculitis or significant stenosis. Inflow: Mild atherosclerotic calcification. No aneurysmal dilatation or dissection. The iliac arteries are patent. Proximal Outflow: Bilateral common femoral and visualized portions of the superficial and profunda femoral arteries are patent without evidence of aneurysm, dissection, vasculitis or significant stenosis. Veins: The IVC is unremarkable.  No portal venous gas. Review of the MIP images confirms the above findings. NON-VASCULAR Lower chest: There is a 9 mm right middle lobe nodule. Follow-up as per recommendation of CT of 12/17/2021. The visualized lung bases are otherwise clear. No intra-abdominal free air or free fluid. Hepatobiliary: The liver is unremarkable. No intrahepatic biliary dilatation. Several noncalcified gallstones measure up to 2.7 cm. There is a 3.1 x 3.4 cm masslike thickening of the gallbladder fundus. Although this may represent adenomyomatosis, gallbladder malignancy is not excluded. Further characterization with MRI without and with contrast is recommended. There is gallbladder wall edema. Pancreas: Unremarkable. No pancreatic ductal dilatation or surrounding inflammatory changes. Spleen: Small splenic calcification. Adrenals/Urinary Tract: The adrenal glands unremarkable. There is no hydronephrosis or nephrolithiasis on either side. Focus of cortical calcification in the inferior pole of the right kidney. Several small left renal cysts and additional bilateral hypodense lesions which are too small to characterize. The kidneys are mildly atrophic. The visualized ureters and urinary bladder appear unremarkable. Stomach/Bowel: There is moderate stool throughout the colon. There is no bowel obstruction or active inflammation. The appendix is normal. No evidence of active GI bleed. Lymphatic: No  adenopathy. Reproductive: The uterus and ovaries are grossly unremarkable. Other: None Musculoskeletal: Osteopenia with degenerative changes of the spine. There is extensive erosive changes with endplate irregularity, fragmentation, and sclerotic changes and associated soft tissue thickening involving L4-L5 most consistent with spondylo discitis. Further evaluation with MRI without and with contrast is recommended. IMPRESSION: 1. No evidence of active GI bleed. 2. Cholelithiasis with gallbladder wall edema. If there is clinical concern for acute cholecystitis, further evaluation with ultrasound or HIDA scan recommended. 3. Masslike thickening of the gallbladder fundus may represent advanced adenomyomatosis versus neoplasm. Further evaluation with MRI without and with contrast is recommended. 4. 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 Further  evaluation with lumbar spine MRI without and with contrast is recommended. 5. There is a 9 mm right middle lobe nodule. Follow-up as per recommendation of prior CT. 6. Aortic Atherosclerosis (ICD10-I70.0). Electronically Signed   By: Anner Crete M.D.   On: 01/16/2022 20:58    Procedures .1-3 Lead EKG Interpretation Performed by: Dorothyann Peng, PA Authorized by: Dorothyann Peng, PA     Interpretation: normal     ECG rate assessment: normal     Rhythm: sinus rhythm     Ectopy: none     Conduction: normal     Medications Ordered in ED Medications  iohexol (OMNIPAQUE) 350 MG/ML injection 93 mL (93 mLs Intravenous Contrast Given 01/16/22 2023)    ED Course/ Medical Decision Making/ A&P                           Medical Decision Making Amount and/or Complexity of Data Reviewed Labs: ordered.   This patient presents to the ED for concern of abdominal pain, this involves an extensive number of treatment options, and is a complaint that carries with it a high risk of complications and morbidity.  The differential diagnosis includes but is not exclusive  to cholelithiasis, cholecystitis, appendicitis, small bowel obstruction, pyelonephritis, mesenteric ischemia, and others   Co morbidities that complicate the patient evaluation     Additional history obtained:  Additional history obtained from EMS External records from outside source obtained and reviewed including discharge summary from 12/30/2021 and ED note from 12/16/2021   Lab Tests:  I Ordered, and personally interpreted labs.  The pertinent results include:  HgB 8.1, down from 10.6 on 1/25, potassium 3.1, fobt positive   Imaging Studies ordered:  I ordered imaging studies including CT angio abdomen/pelvis and chest x-ray  I independently visualized and interpreted imaging which showed no acute process on chest x-ray.  I agree with the radiologist interpretation   Cardiac Monitoring:  The patient was maintained on a cardiac monitor.  I personally viewed and interpreted the cardiac monitored which showed an underlying rhythm of: normal sinus rhythm   Medicines ordered and prescription drug management:  No medications ordered during the visit   Test Considered:  Hepatic panel    Consultations Obtained:  I requested consultation with the hospitalist, Dr. Cyd Silence,  and discussed lab and imaging findings as well as pertinent plan - they recommend: admission to the hospital   Reevaluation:  After the interventions noted above, I reevaluated the patient and found that they have :stayed the same      Dispostion:  After consideration of the diagnostic results and the patients response to treatment, I feel that the patent would benefit from admission to the hospital. The patient had a drop in HgB from 10.6 to 8.1 over the past two weeks. She also had positive fecal occult blood test. No acute process was noted on the CT angio abdomen/pelvis except for cholelithiasis consistent with previous studies with no clinical correlation. The patient was also recently  hospitalized for the conditions in the PMH. I believe it would be in the patient's best interest to stay in the hospital for HgB trending and GI consult.       Final Clinical Impression(s) / ED Diagnoses Final diagnoses:  Generalized abdominal pain  Anemia, unspecified type    Rx / DC Orders ED Discharge Orders     None         Dorothyann Peng, [place]  01/16/22 2200    Noemi Chapel, MD 01/17/22 1950

## 2022-01-17 ENCOUNTER — Observation Stay (HOSPITAL_COMMUNITY): Payer: Medicare HMO

## 2022-01-17 DIAGNOSIS — K2289 Other specified disease of esophagus: Secondary | ICD-10-CM | POA: Diagnosis not present

## 2022-01-17 DIAGNOSIS — K829 Disease of gallbladder, unspecified: Secondary | ICD-10-CM | POA: Diagnosis present

## 2022-01-17 DIAGNOSIS — D62 Acute posthemorrhagic anemia: Secondary | ICD-10-CM | POA: Diagnosis not present

## 2022-01-17 DIAGNOSIS — K3189 Other diseases of stomach and duodenum: Secondary | ICD-10-CM | POA: Diagnosis not present

## 2022-01-17 DIAGNOSIS — K8689 Other specified diseases of pancreas: Secondary | ICD-10-CM | POA: Diagnosis not present

## 2022-01-17 DIAGNOSIS — N186 End stage renal disease: Secondary | ICD-10-CM

## 2022-01-17 DIAGNOSIS — K8020 Calculus of gallbladder without cholecystitis without obstruction: Secondary | ICD-10-CM | POA: Diagnosis present

## 2022-01-17 DIAGNOSIS — M4627 Osteomyelitis of vertebra, lumbosacral region: Secondary | ICD-10-CM | POA: Diagnosis present

## 2022-01-17 DIAGNOSIS — D649 Anemia, unspecified: Secondary | ICD-10-CM | POA: Diagnosis not present

## 2022-01-17 DIAGNOSIS — K2981 Duodenitis with bleeding: Secondary | ICD-10-CM | POA: Diagnosis present

## 2022-01-17 DIAGNOSIS — D631 Anemia in chronic kidney disease: Secondary | ICD-10-CM | POA: Diagnosis present

## 2022-01-17 DIAGNOSIS — N2581 Secondary hyperparathyroidism of renal origin: Secondary | ICD-10-CM | POA: Diagnosis present

## 2022-01-17 DIAGNOSIS — R911 Solitary pulmonary nodule: Secondary | ICD-10-CM | POA: Diagnosis present

## 2022-01-17 DIAGNOSIS — Z8616 Personal history of COVID-19: Secondary | ICD-10-CM | POA: Diagnosis not present

## 2022-01-17 DIAGNOSIS — I330 Acute and subacute infective endocarditis: Secondary | ICD-10-CM | POA: Diagnosis not present

## 2022-01-17 DIAGNOSIS — K644 Residual hemorrhoidal skin tags: Secondary | ICD-10-CM | POA: Diagnosis present

## 2022-01-17 DIAGNOSIS — Z992 Dependence on renal dialysis: Secondary | ICD-10-CM

## 2022-01-17 DIAGNOSIS — I120 Hypertensive chronic kidney disease with stage 5 chronic kidney disease or end stage renal disease: Secondary | ICD-10-CM | POA: Diagnosis present

## 2022-01-17 DIAGNOSIS — M898X9 Other specified disorders of bone, unspecified site: Secondary | ICD-10-CM | POA: Diagnosis present

## 2022-01-17 DIAGNOSIS — E876 Hypokalemia: Secondary | ICD-10-CM | POA: Diagnosis present

## 2022-01-17 DIAGNOSIS — R7881 Bacteremia: Secondary | ICD-10-CM | POA: Diagnosis present

## 2022-01-17 DIAGNOSIS — M109 Gout, unspecified: Secondary | ICD-10-CM | POA: Diagnosis present

## 2022-01-17 DIAGNOSIS — K635 Polyp of colon: Secondary | ICD-10-CM | POA: Diagnosis not present

## 2022-01-17 DIAGNOSIS — R195 Other fecal abnormalities: Secondary | ICD-10-CM | POA: Diagnosis not present

## 2022-01-17 DIAGNOSIS — R1084 Generalized abdominal pain: Secondary | ICD-10-CM | POA: Diagnosis not present

## 2022-01-17 DIAGNOSIS — B9562 Methicillin resistant Staphylococcus aureus infection as the cause of diseases classified elsewhere: Secondary | ICD-10-CM | POA: Diagnosis present

## 2022-01-17 DIAGNOSIS — K828 Other specified diseases of gallbladder: Secondary | ICD-10-CM

## 2022-01-17 DIAGNOSIS — I4892 Unspecified atrial flutter: Secondary | ICD-10-CM

## 2022-01-17 DIAGNOSIS — Z8249 Family history of ischemic heart disease and other diseases of the circulatory system: Secondary | ICD-10-CM | POA: Diagnosis not present

## 2022-01-17 DIAGNOSIS — G8929 Other chronic pain: Secondary | ICD-10-CM | POA: Diagnosis present

## 2022-01-17 DIAGNOSIS — K838 Other specified diseases of biliary tract: Secondary | ICD-10-CM | POA: Diagnosis not present

## 2022-01-17 DIAGNOSIS — B3781 Candidal esophagitis: Secondary | ICD-10-CM | POA: Diagnosis present

## 2022-01-17 DIAGNOSIS — M549 Dorsalgia, unspecified: Secondary | ICD-10-CM | POA: Diagnosis not present

## 2022-01-17 LAB — CBC
HCT: 21.7 % — ABNORMAL LOW (ref 36.0–46.0)
HCT: 25.2 % — ABNORMAL LOW (ref 36.0–46.0)
HCT: 24.1 % — ABNORMAL LOW (ref 36.0–46.0)
Hemoglobin: 7 g/dL — ABNORMAL LOW (ref 12.0–15.0)
Hemoglobin: 7.9 g/dL — ABNORMAL LOW (ref 12.0–15.0)
Hemoglobin: 7.8 g/dL — ABNORMAL LOW (ref 12.0–15.0)
MCH: 31.1 pg (ref 26.0–34.0)
MCH: 30.3 pg (ref 26.0–34.0)
MCH: 31.2 pg (ref 26.0–34.0)
MCHC: 32.3 g/dL (ref 30.0–36.0)
MCHC: 31.3 g/dL (ref 30.0–36.0)
MCHC: 32.4 g/dL (ref 30.0–36.0)
MCV: 96.4 fL (ref 80.0–100.0)
MCV: 96.6 fL (ref 80.0–100.0)
MCV: 96.4 fL (ref 80.0–100.0)
Platelets: 216 10*3/uL (ref 150–400)
Platelets: 235 10*3/uL (ref 150–400)
Platelets: 237 10*3/uL (ref 150–400)
RBC: 2.25 MIL/uL — ABNORMAL LOW (ref 3.87–5.11)
RBC: 2.61 MIL/uL — ABNORMAL LOW (ref 3.87–5.11)
RBC: 2.5 MIL/uL — ABNORMAL LOW (ref 3.87–5.11)
RDW: 15.6 % — ABNORMAL HIGH (ref 11.5–15.5)
RDW: 15.7 % — ABNORMAL HIGH (ref 11.5–15.5)
RDW: 15.7 % — ABNORMAL HIGH (ref 11.5–15.5)
WBC: 4.5 10*3/uL (ref 4.0–10.5)
WBC: 4.1 10*3/uL (ref 4.0–10.5)
WBC: 4.6 10*3/uL (ref 4.0–10.5)
nRBC: 0 % (ref 0.0–0.2)
nRBC: 0 % (ref 0.0–0.2)
nRBC: 0 % (ref 0.0–0.2)

## 2022-01-17 LAB — COMPREHENSIVE METABOLIC PANEL
ALT: 9 U/L (ref 0–44)
AST: 13 U/L — ABNORMAL LOW (ref 15–41)
Albumin: 2 g/dL — ABNORMAL LOW (ref 3.5–5.0)
Alkaline Phosphatase: 100 U/L (ref 38–126)
Anion gap: 10 (ref 5–15)
BUN: 12 mg/dL (ref 8–23)
CO2: 30 mmol/L (ref 22–32)
Calcium: 8.8 mg/dL — ABNORMAL LOW (ref 8.9–10.3)
Chloride: 96 mmol/L — ABNORMAL LOW (ref 98–111)
Creatinine, Ser: 3.33 mg/dL — ABNORMAL HIGH (ref 0.44–1.00)
GFR, Estimated: 14 mL/min — ABNORMAL LOW (ref >60)
Glucose, Bld: 88 mg/dL (ref 70–99)
Potassium: 3.2 mmol/L — ABNORMAL LOW (ref 3.5–5.1)
Sodium: 136 mmol/L (ref 135–145)
Total Bilirubin: 0.5 mg/dL (ref 0.3–1.2)
Total Protein: 5.8 g/dL — ABNORMAL LOW (ref 6.5–8.1)

## 2022-01-17 LAB — LACTATE DEHYDROGENASE: LDH: 231 U/L — ABNORMAL HIGH (ref 98–192)

## 2022-01-17 LAB — RETICULOCYTES
Immature Retic Fract: 8.7 % (ref 2.3–15.9)
RBC.: 2.61 MIL/uL — ABNORMAL LOW (ref 3.87–5.11)
Retic Count, Absolute: 61.3 10*3/uL (ref 19.0–186.0)
Retic Ct Pct: 2.4 % (ref 0.4–3.1)

## 2022-01-17 LAB — DIC (DISSEMINATED INTRAVASCULAR COAGULATION)PANEL
D-Dimer, Quant: 3.58 ug/mL-FEU — ABNORMAL HIGH (ref 0.00–0.50)
Fibrinogen: 596 mg/dL — ABNORMAL HIGH (ref 210–475)
INR: 1.2 (ref 0.8–1.2)
Platelets: 234 10*3/uL (ref 150–400)
Prothrombin Time: 14.9 seconds (ref 11.4–15.2)
Smear Review: NONE SEEN
aPTT: 42 seconds — ABNORMAL HIGH (ref 24–36)

## 2022-01-17 LAB — FERRITIN: Ferritin: 1471 ng/mL — ABNORMAL HIGH (ref 11–307)

## 2022-01-17 LAB — VANCOMYCIN, RANDOM: Vancomycin Rm: 14

## 2022-01-17 LAB — VITAMIN B12: Vitamin B-12: 571 pg/mL (ref 180–914)

## 2022-01-17 LAB — IRON AND TIBC
Iron: 38 ug/dL (ref 28–170)
Saturation Ratios: 25 % (ref 10.4–31.8)
TIBC: 154 ug/dL — ABNORMAL LOW (ref 250–450)
UIBC: 116 ug/dL

## 2022-01-17 LAB — C-REACTIVE PROTEIN
CRP: 13.4 mg/dL — ABNORMAL HIGH (ref <1.0)
CRP: 13.1 mg/dL — ABNORMAL HIGH (ref <1.0)

## 2022-01-17 LAB — CBG MONITORING, ED: Glucose-Capillary: 94 mg/dL (ref 70–99)

## 2022-01-17 LAB — FOLATE: Folate: 10.6 ng/mL (ref >5.9)

## 2022-01-17 LAB — MRSA NEXT GEN BY PCR, NASAL: MRSA by PCR Next Gen: DETECTED — AB

## 2022-01-17 LAB — PREPARE RBC (CROSSMATCH)

## 2022-01-17 LAB — MAGNESIUM: Magnesium: 2 mg/dL (ref 1.7–2.4)

## 2022-01-17 MED ORDER — PEG 3350-KCL-NA BICARB-NACL 420 G PO SOLR
4000.0000 mL | Freq: Once | ORAL | Status: AC
  Administered 2022-01-17: 4000 mL via ORAL
  Filled 2022-01-17: qty 4000

## 2022-01-17 MED ORDER — SODIUM CHLORIDE 0.9 % IV SOLN
2.0000 g | INTRAVENOUS | Status: DC
  Administered 2022-01-17 – 2022-01-19 (×3): 2 g via INTRAVENOUS
  Filled 2022-01-17 (×3): qty 20

## 2022-01-17 MED ORDER — ALBUTEROL SULFATE HFA 108 (90 BASE) MCG/ACT IN AERS
2.0000 | INHALATION_SPRAY | RESPIRATORY_TRACT | Status: DC | PRN
  Filled 2022-01-17: qty 6.7

## 2022-01-17 MED ORDER — METOPROLOL TARTRATE 100 MG PO TABS
100.0000 mg | ORAL_TABLET | Freq: Two times a day (BID) | ORAL | Status: DC
  Administered 2022-01-17 – 2022-01-22 (×8): 100 mg via ORAL
  Filled 2022-01-17 (×7): qty 1
  Filled 2022-01-17: qty 4

## 2022-01-17 MED ORDER — HYDRALAZINE HCL 20 MG/ML IJ SOLN
10.0000 mg | Freq: Four times a day (QID) | INTRAMUSCULAR | Status: DC | PRN
  Administered 2022-01-17 (×2): 10 mg via INTRAVENOUS
  Filled 2022-01-17 (×2): qty 1

## 2022-01-17 MED ORDER — VANCOMYCIN HCL 500 MG/100ML IV SOLN: 750.0000 mg | INTRAVENOUS | Status: DC

## 2022-01-17 MED ORDER — CHLORHEXIDINE GLUCONATE CLOTH 2 % EX PADS
6.0000 | MEDICATED_PAD | Freq: Every day | CUTANEOUS | Status: DC
  Administered 2022-01-17 – 2022-01-22 (×5): 6 via TOPICAL

## 2022-01-17 MED ORDER — VANCOMYCIN HCL 750 MG/150ML IV SOLN
750.0000 mg | INTRAVENOUS | Status: DC
  Administered 2022-01-21: 750 mg via INTRAVENOUS
  Filled 2022-01-17 (×3): qty 150

## 2022-01-17 MED ORDER — DIPHENHYDRAMINE HCL 50 MG/ML IJ SOLN: 25.0000 mg | Freq: Four times a day (QID) | INTRAMUSCULAR | Status: DC | PRN

## 2022-01-17 MED ORDER — METRONIDAZOLE 500 MG PO TABS
500.0000 mg | ORAL_TABLET | Freq: Two times a day (BID) | ORAL | Status: DC
  Administered 2022-01-17 – 2022-01-18 (×4): 500 mg via ORAL
  Filled 2022-01-17 (×5): qty 1

## 2022-01-17 MED ORDER — POTASSIUM CHLORIDE CRYS ER 20 MEQ PO TBCR
40.0000 meq | EXTENDED_RELEASE_TABLET | Freq: Once | ORAL | Status: AC
  Administered 2022-01-17: 40 meq via ORAL
  Filled 2022-01-17: qty 2

## 2022-01-17 MED ORDER — AMLODIPINE BESYLATE 10 MG PO TABS
10.0000 mg | ORAL_TABLET | Freq: Every day | ORAL | Status: DC
  Administered 2022-01-17 – 2022-01-22 (×5): 10 mg via ORAL
  Filled 2022-01-17 (×3): qty 1
  Filled 2022-01-17: qty 2
  Filled 2022-01-17: qty 1

## 2022-01-17 MED ORDER — SODIUM CHLORIDE 0.9% IV SOLUTION: Freq: Once | INTRAVENOUS | Status: DC

## 2022-01-17 MED ORDER — VANCOMYCIN HCL 750 MG/150ML IV SOLN
750.0000 mg | Freq: Once | INTRAVENOUS | Status: AC
  Administered 2022-01-17: 750 mg via INTRAVENOUS
  Filled 2022-01-17: qty 150

## 2022-01-17 MED ORDER — PANTOPRAZOLE SODIUM 40 MG IV SOLR
40.0000 mg | Freq: Two times a day (BID) | INTRAVENOUS | Status: DC
  Administered 2022-01-17 – 2022-01-18 (×4): 40 mg via INTRAVENOUS
  Filled 2022-01-17 (×4): qty 10

## 2022-01-17 NOTE — ED Notes (Signed)
Pt c/o abd pain  and pain in her sides.   Repositioned on the stretcher  med guiven  warm blankets  given

## 2022-01-17 NOTE — Assessment & Plan Note (Signed)
·   Please see assessment and plan above °

## 2022-01-17 NOTE — TOC Initial Note (Signed)
Transition of Care [facility]) - Initial/Assessment Note    Patient Details  Name: Diamond [name] MRN: [id number] Date of Birth: Sep 18, 1951  [facility] of [facility]) CM/SW Contact:    Verdell Carmine, RN Phone Number: 01/17/2022, 1:33 PM  Clinical Narrative:                    The Transition of Care Department [facility]) has reviewed patient and no TOC needs have been identified at this time. We will continue to monitor patient advancement through interdisciplinary progression rounds. If new patient transition needs arise, please place a TOC consult      Patient Goals and CMS Choice        Expected Discharge Plan and Services                                                Prior Living Arrangements/Services                       Activities of Daily Living      Permission Sought/Granted                  Emotional Assessment              Admission diagnosis:  Acute blood loss anemia [D62] Patient Active Problem List   Diagnosis Date Noted   Gallbladder mass 01/17/2022   Acute blood loss anemia 01/16/2022   Acute infective endocarditis 01/16/2022   Septic discitis of lumbar region 01/16/2022   Hypotension 12/27/2021   Bleeding from dialysis catheter site 12/26/2021   Atrial flutter ([place]) 12/25/2021   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 seen on TEE on 1/17 12/24/2021   Lung nodule seen on imaging study 12/23/2021   MRSA bacteremia    Vertebral osteomyelitis ([place])    Leukocytosis 12/17/2021   Severe sepsis ([place]) due to MRSA bacteremia, L5-S1 discitis and endocarditis 12/17/2021   Lactic acidosis 12/17/2021   Low back pain 12/17/2021   ESRD on hemodialysis ([place]) 12/17/2021   Mild intermittent asthma 12/17/2021   Vitamin D deficiency 01/02/2021   Hyperparathyroidism ([place]) 12/31/2020   CKD (chronic kidney disease), stage III ([place]) 11/10/2019   Cardiomegaly 11/05/2019   Hyperkalemia 11/05/2019   Hypokalemia 10/18/2019    Pneumonia due to COVID-19 virus 10/17/2019   CAP (community acquired pneumonia) 10/16/2019   Essential hypertension    Asthma    Gout    Hyponatremia    Multifactorial anemia-acute blood loss from bleeding HD catheter site-superimposed on anemia related to ESRD.    PCP:  [name], MD Pharmacy:   [place], [place] - [address] [place] [address] Phone: [phone] Fax: [phone]     Social Determinants of Health (SDOH) Interventions    Readmission Risk Interventions No flowsheet data found.

## 2022-01-17 NOTE — Consult Note (Addendum)
Referring Provider: Dr. Lala Lund Primary Care Physician:  [name], MD Primary Gastroenterologist:  Althia Forts   Reason for Consultation:  Anemia, heme + stool  HPI: Diamond [name] is a 71 y.o. female with a past medical history of gout, hypertension, paroxysmal atrial fibrillation on Eliquis, CKD on hemodialysis T/Th/S, asthma, COVID-19 pneumonia 10/2019 and a right pulmonary nodule.  She was admitted to the hospital 1/11 - 12/30/2021 with sepsis due to MRSA bacteremia.  Her hospital course was complicated by the onset of atrial fibrillation with RVR treated with Amiodarone.  A TEE on 1/7 without obvious valvular vegetation but identified a right atrial/IVC globular mobile mass compatible with thrombotic/vegetative material concerning for endocarditis.  ID was consulted and she was treated with IV Vancomycin with plans to continue until 02/13/2022.  Per cardiology, the right atrial thrombus was likely related to her HD catheter.  She was evaluated by vascular surgery on 1/19 and IV Heparin was initiated. She subsequently developed acute bleeding from her catheter site on 1/21 resulting in hypotension secondary to acute blood loss. Heparin was held with hemostasis. Heparin IV was restarted on 1/22 with no further active bleeding and she was discharged home on Eliquis.  She presented to the ED 01/16/2022 with generalized abdominal pain and lower back pain just persisted for the past 2 to 3 months. During the patient's dialysis yesterday, the dialysis nurse stopped the dialysis session as she was concerned the patient was developing sepsis and she was transported by EMS to [facility] ED further evaluation.  Labs in the ED showed a hemoglobin level 8.1 down from a hemoglobin level of 10.6 two weeks ago. Rectal exam without gross blood but Hemoccult was positive. Abdominal/pelvic CTA with contrast 01/16/2022 was negative for active GI bleed but showed cholelithiasis with gallbladder  wall edema and a masslike thickening of the gallbladder fundus concerning for advanced adenomyomatosis versus neoplasm. An abdominal MRI/MRCP without contrast today showed cholelithiasis with evidence suggestive of acute cholecystitis with a mass in the fundus of the gallbladder concerning for cholangiocarcinoma, prominent enlarged portocaval and hepatoduodenal ligament lymph nodes, dilatation of the CBD without intrahepatic biliary ductal dilatation without obvious biliary obstruction.  Mild diffuse pancreatic duct dilatation was noted without evidence of a pancreatic head or ampullary mass.  General surgery and GI consultation was requested for further evaluation.  She is a challenging historian.  She reports having generalized abdominal pain with lower back pain for the past 2 3 months which has progressively worsened.  She has lost 30 pounds over the past few months.  No fever, sweats or chills.  She has intermittent nausea and vomiting, clear emesis which occur sporadically since 07/2019.  No hematemesis.  She had heartburn in the past but not recently.  No dysphagia.  She typically passes a normal formed brown bowel movement daily.  No rectal bleeding or black stools.  No NSAID use.  No alcohol or drug use.  She is a non-smoker.  She denies ever having an EGD or colonoscopy.  She passed a loose green stool in the ED.  No rectal bleeding or melanotic stool.  The patient was prescribed Eliquis at the time of her hospital discharge 12/30/2021, however, she stated she never picked up the prescription.  Past Medical History:  Diagnosis Date   Asthma    Atrial flutter with rapid ventricular response ([place]) 12/25/2021   Chronic kidney disease    dialysis Tues Thurs Sat   Dysrhythmia    PAF 10/2019 in  setting of COVID-19 PNA   Gout    Hypertension     Past Surgical History:  Procedure Laterality Date   AV FISTULA PLACEMENT Left 12/23/2021   Procedure: LEFT ARM BRACHIOBASILIC VEIN ARTERIOVENOUS (AV)  FISTULA CREATION;  Surgeon: [name], MD;  Location: [place];  Service: Vascular;  Laterality: Left;   [place] Right 12/23/2021   Procedure: INSERTION OF DIALYSIS CATHETER;  Surgeon: [name], MD;  Location: [place];  Service: Vascular;  Laterality: Right;   TEE WITHOUT CARDIOVERSION N/A 12/22/2021   Procedure: TRANSESOPHAGEAL ECHOCARDIOGRAM (TEE);  Surgeon: Jerline Pain, MD;  Location[facility];  Service: Cardiovascular;  Laterality: N/A;   ULTRASOUND GUIDANCE FOR VASCULAR ACCESS  12/23/2021   Procedure: ULTRASOUND GUIDANCE FOR VASCULAR ACCESS;  Surgeon: [name], MD;  Location: [place];  Service: Vascular;;    Prior to Admission medications   Medication Sig Start Date End Date Taking? Authorizing Provider  acetaminophen (TYLENOL) 325 MG tablet Take 2 tablets (650 mg total) by mouth every 6 (six) hours as needed for mild pain (or Fever >/= 101). 12/30/21  Yes Ghimire, Henreitta Leber, MD  albuterol (VENTOLIN HFA) 108 (90 Base) MCG/ACT inhaler Inhale 2 puffs into the lungs every 4 (four) hours as needed for wheezing or shortness of breath. 10/08/19  Yes [provider]  allopurinol (ZYLOPRIM) 100 MG tablet Take 200 mg by mouth daily. 12/03/21  Yes [provider]  cyclobenzaprine (FLEXERIL) 5 MG tablet Take 5 mg by mouth daily. Pt take 0.5 tablet one a day.   Yes [provider]  GABAPENTIN PO Take 100 mg by mouth at bedtime.   Yes [provider]  lidocaine (LIDODERM) 5 % Place 1 patch onto the skin daily before breakfast. Remove & Discard patch within 12 hours or as directed by MD 12/31/21  Yes Ghimire, Henreitta Leber, MD  apixaban (ELIQUIS) 5 MG TABS tablet Take 1 tablet (5 mg total) by mouth 2 (two) times daily. Patient not taking: Reported on 01/16/2022 12/30/21   Jonetta Osgood, MD  colchicine 0.6 MG tablet Take 0.6 mg by mouth daily as needed (gout). Patient not taking: Reported on 01/16/2022     [provider]  hydrALAZINE (APRESOLINE) 50 MG tablet Take 1 tablet (50 mg total) by mouth every 8 (eight) hours. Patient not taking: Reported on 01/16/2022 12/30/21   Jonetta Osgood, MD  Lidocaine-Menthol 4-5 % PTCH Apply topically. 12/31/21   [provider]  metoprolol tartrate (LOPRESSOR) 50 MG tablet Take 1 tablet (50 mg total) by mouth 2 (two) times daily. 12/30/21   Ghimire, Henreitta Leber, MD  ondansetron (ZOFRAN) 4 MG tablet Take 4 mg by mouth every 8 (eight) hours as needed. 09/17/21   [provider]  oxyCODONE (OXY IR/ROXICODONE) 5 MG immediate release tablet Take 1 tablet (5 mg total) by mouth every 6 (six) hours as needed for severe pain. 12/30/21   Ghimire, Henreitta Leber, MD  polyethylene glycol (MIRALAX / GLYCOLAX) 17 g packet Take 17 g by mouth daily as needed for mild constipation. 12/30/21   Ghimire, Henreitta Leber, MD  senna-docusate (SENOKOT-S) 8.6-50 MG tablet Take 2 tablets by mouth 2 (two) times daily. 12/30/21   Ghimire, Henreitta Leber, MD  sevelamer carbonate (RENVELA) 800 MG tablet Take 2 tablets (1,600 mg total) by mouth 3 (three) times daily with meals. 12/30/21   Ghimire, Henreitta Leber, MD  vancomycin (VANCOREADY) 500 MG/100ML IVPB Inject 100  mLs (500 mg total) into the vein Every Tuesday,Thursday,and Saturday with dialysis. 12/31/21 02/13/22  GhimireHenreitta Leber, MD    Current Facility-Administered Medications  Medication Dose Route Frequency Provider Last Rate Last Admin   acetaminophen (TYLENOL) tablet 650 mg  650 mg Oral Q6H PRN Shalhoub, Sherryll Burger, MD       Or   acetaminophen (TYLENOL) suppository 650 mg  650 mg Rectal Q6H PRN Shalhoub, Sherryll Burger, MD       albuterol (PROVENTIL) (2.5 MG/3ML) 0.083% nebulizer solution 3 mL  3 mL Inhalation Q4H PRN Shalhoub, Sherryll Burger, MD       allopurinol (ZYLOPRIM) tablet 200 mg  200 mg Oral Daily Shalhoub, Sherryll Burger, MD       cyclobenzaprine (FLEXERIL) tablet 2.5 mg  2.5 mg Oral Daily Shalhoub, Sherryll Burger, MD       gabapentin  (NEURONTIN) capsule 100 mg  100 mg Oral QHS Vernelle Emerald, MD   100 mg at 01/17/22 0149   hydrALAZINE (APRESOLINE) injection 10 mg  10 mg Intravenous Q6H PRN Shalhoub, Sherryll Burger, MD       metoprolol tartrate (LOPRESSOR) tablet 50 mg  50 mg Oral BID Vernelle Emerald, MD   50 mg at 01/17/22 0136   ondansetron (ZOFRAN) tablet 4 mg  4 mg Oral Q6H PRN Shalhoub, Sherryll Burger, MD       Or   ondansetron [facility]) injection 4 mg  4 mg Intravenous Q6H PRN Shalhoub, Sherryll Burger, MD       polyethylene glycol (MIRALAX / GLYCOLAX) packet 17 g  17 g Oral Daily PRN Shalhoub, Sherryll Burger, MD       potassium chloride SA (KLOR-CON M) CR tablet 40 mEq  40 mEq Oral Once Thurnell Lose, MD       senna-docusate (Senokot-S) tablet 2 tablet  2 tablet Oral BID Vernelle Emerald, MD   2 tablet at 01/17/22 0148   sevelamer carbonate (RENVELA) tablet 1,600 mg  1,600 mg Oral TID WC Shalhoub, Sherryll Burger, MD       [START ON 01/19/2022] vancomycin (VANCOREADY) IVPB 500 mg/100 mL  500 mg Intravenous Q T,Th,Sa-HD Shalhoub, Sherryll Burger, MD       Current Outpatient Medications  Medication Sig Dispense Refill   acetaminophen (TYLENOL) 325 MG tablet Take 2 tablets (650 mg total) by mouth every 6 (six) hours as needed for mild pain (or Fever >/= 101).     albuterol (VENTOLIN HFA) 108 (90 Base) MCG/ACT inhaler Inhale 2 puffs into the lungs every 4 (four) hours as needed for wheezing or shortness of breath.     allopurinol (ZYLOPRIM) 100 MG tablet Take 200 mg by mouth daily.     cyclobenzaprine (FLEXERIL) 5 MG tablet Take 5 mg by mouth daily. Pt take 0.5 tablet one a day.     GABAPENTIN PO Take 100 mg by mouth at bedtime.     lidocaine (LIDODERM) 5 % Place 1 patch onto the skin daily before breakfast. Remove & Discard patch within 12 hours or as directed by MD 30 patch 0   apixaban (ELIQUIS) 5 MG TABS tablet Take 1 tablet (5 mg total) by mouth 2 (two) times daily. (Patient not taking: Reported on 01/16/2022) 60 tablet    colchicine 0.6 MG  tablet Take 0.6 mg by mouth daily as needed (gout). (Patient not taking: Reported on 01/16/2022)     hydrALAZINE (APRESOLINE) 50 MG tablet Take 1 tablet (50 mg total) by mouth every 8 (eight) hours. (Patient  not taking: Reported on 01/16/2022)     Lidocaine-Menthol 4-5 % PTCH Apply topically.     metoprolol tartrate (LOPRESSOR) 50 MG tablet Take 1 tablet (50 mg total) by mouth 2 (two) times daily.     ondansetron (ZOFRAN) 4 MG tablet Take 4 mg by mouth every 8 (eight) hours as needed.     oxyCODONE (OXY IR/ROXICODONE) 5 MG immediate release tablet Take 1 tablet (5 mg total) by mouth every 6 (six) hours as needed for severe pain. 10 tablet 0   polyethylene glycol (MIRALAX / GLYCOLAX) 17 g packet Take 17 g by mouth daily as needed for mild constipation. 14 each 0   senna-docusate (SENOKOT-S) 8.6-50 MG tablet Take 2 tablets by mouth 2 (two) times daily.     sevelamer carbonate (RENVELA) 800 MG tablet Take 2 tablets (1,600 mg total) by mouth 3 (three) times daily with meals.     vancomycin (VANCOREADY) 500 MG/100ML IVPB Inject 100 mLs (500 mg total) into the vein Every Tuesday,Thursday,and Saturday with dialysis. 4000 mL     Allergies as of 01/16/2022   (No Known Allergies)    Family History  Problem Relation Age of Onset   Hypertension Mother    Hypertension Father    Colon cancer Brother    Lung cancer Brother     Social History   Socioeconomic History   Marital status: Widowed    Spouse name: Not on file   Number of children: Not on file   Years of education: Not on file   Highest education level: Not on file  Occupational History   Not on file  Tobacco Use   Smoking status: Never   Smokeless tobacco: Never  Vaping Use   Vaping Use: Never used  Substance and Sexual Activity   Alcohol use: No   Drug use: No   Sexual activity: Not Currently    Birth control/protection: Post-menopausal  Other Topics Concern   Not on file  Social History Narrative   Not on file   Social  Determinants of Health   Financial Resource Strain: Not on file  Food Insecurity: Not on file  Transportation Needs: Not on file  Physical Activity: Not on file  Stress: Not on file  Social Connections: Not on file  Intimate Partner Violence: Not on file    Review of Systems: Gen: See HPI. CV: Denies chest pain, palpitations or edema. Resp: Denies cough, shortness of breath of hemoptysis.  GI: See HPI. GU : Denies urinary burning, blood in urine, increased urinary frequency or incontinence. MS:+ Lower back pain  Derm: Denies rash, itchiness, skin lesions or unhealing ulcers. Psych: Denies depression, anxiety, memory loss, suicidal ideation or confusion. Heme: Denies easy bruising, bleeding. Neuro:  Denies headaches, dizziness or paresthesias. Endo:  Denies any problems with DM, thyroid or adrenal function.  Physical Exam: Vital signs in last 24 hours: Temp:  [99.2 F (37.3 C)] 99.2 F (37.3 C) (02/12 0718) Pulse Rate:  [85-119] 85 (02/12 0700) Resp:  [16-21] 17 (02/12 0700) BP: (112-185)/(85-116) 161/109 (02/12 0700) SpO2:  [93 %-100 %] 93 % (02/12 0700) Weight:  [65 kg] 65 kg (02/11 1515)   General: 71 year old female in no acute distress. Head:  Normocephalic and atraumatic. Eyes:  No scleral icterus. Conjunctiva pink. Ears:  Normal auditory acuity. Nose:  No deformity, discharge or lesions. Mouth: Upper dentures.  No ulcers or lesions.  Neck:  Supple. No lymphadenopathy or thyromegaly.  Lungs: Breath sounds clear, diminished in the bases bilaterally. Heart:  Regular rate and rhythm, no murmurs Abdomen: Soft, nondistended.  Generalized tenderness without rebound or guarding.  No palpable mass.  Positive bowel sounds to all 4 quadrants. Rectal: No frank red blood in the rectum, stool heme + per the ED physician.  Musculoskeletal:  Symmetrical without gross deformities.  Pulses:  Normal pulses noted. Extremities:  Without clubbing or edema.LUE with AV fistula + bruit  and thrill. Neurologic:  Alert and  oriented x 4. No focal deficits.  Skin:  Intact without significant lesions or rashes. Psych:  Alert and cooperative. Normal mood and affect.  Intake/Output from previous day: No intake/output data recorded. Intake/Output this shift: No intake/output data recorded.  Lab Results: Recent Labs    01/16/22 1550 01/17/22 0426  WBC 4.3 4.5  HGB 8.1* 7.0*  HCT 26.6* 21.7*  PLT 249 216   BMET Recent Labs    01/16/22 1550 01/17/22 0426  NA 137 136  K 3.1* 3.2*  CL 95* 96*  CO2 30 30  GLUCOSE 84 88  BUN 9 12  CREATININE 2.26* 3.33*  CALCIUM 8.5* 8.8*   LFT Recent Labs    01/17/22 0426  PROT 5.8*  ALBUMIN 2.0*  AST 13*  ALT 9  ALKPHOS 100  BILITOT 0.5   PT/INR Recent Labs    01/16/22 1605  LABPROT 15.9*  INR 1.3*   Hepatitis Panel No results for input(s): HEPBSAG, HCVAB, HEPAIGM, HEPBIGM in the last 72 hours.    Studies/Results: MR ABDOMEN MRCP WO CONTRAST  Result Date: 01/17/2022 CLINICAL DATA:  71 year old female with history of abdominal pain and back pain. Possible cholecystitis noted on recent CT examination, in addition to mass-like thickening of the gallbladder fundus. Further evaluation. EXAM: MRI ABDOMEN WITHOUT CONTRAST  (INCLUDING MRCP) TECHNIQUE: Multiplanar multisequence MR imaging of the abdomen was performed. Heavily T2-weighted images of the biliary and pancreatic ducts were obtained, and three-dimensional MRCP images were rendered by post processing. COMPARISON:  No prior abdominal MRI. CTA of the abdomen and pelvis 01/16/2022. FINDINGS: Comment: Today's study is limited for detection and characterization of visceral and/or vascular lesions by lack of IV gadolinium. Lower chest: Unremarkable. Hepatobiliary: No definite suspicious cystic or solid hepatic lesions are confidently identified on today's noncontrast examination. There is diffuse low signal intensity throughout the hepatic parenchyma on in phase dual echo  images, as well as on T2 weighted sequences, suggesting substantial hepatic iron deposition. No intrahepatic biliary ductal dilatation. Common bile duct is mildly dilated measuring 9 mm in the porta hepatis. No filling defect within the common bile duct to suggest choledocholithiasis. There are numerous filling defects within the gallbladder, largest of which appears impacted in the neck of the gallbladder measuring up to 2.8 cm in diameter. Associated with this there is diffuse gallbladder wall thickening and edema, along with a trace amount of pericholecystic fluid. Notably, there is mass-like thickening of the fundus of the gallbladder (axial image 22 of series 4 and coronal image 23 of series 5) which is heterogeneous in signal intensity on T1 and T2 weighted images and demonstrates internal areas of diffusion restriction, concerning for gallbladder neoplasm (poorly evaluated on today's noncontrast examination). Pancreas: No definite pancreatic mass confidently identified on today's noncontrast examination. Diffuse pancreatic ductal dilatation is noted, with the pancreatic duct measuring up to 5 mm in the body of the pancreas. No peripancreatic fluid collections or inflammatory changes. Spleen: Diffuse low signal intensity throughout the splenic parenchyma on in phase dual echo images and T2 weighted sequences, indicative of splenic iron  deposition. Adrenals/Urinary Tract: Numerous T1 hypointense, T2 hyperintense lesions are noted in the kidneys bilaterally, incompletely characterized on today's noncontrast examination, but likely to represent cysts, largest of which is exophytic measuring up to 2.6 cm in the anterolateral aspect of the lower pole of the left kidney. No hydroureteronephrosis in the visualized portions of the abdomen. Bilateral adrenal glands are normal in appearance. Stomach/Bowel: Visualized portions are unremarkable. Vascular/Lymphatic: No aneurysm identified in the visualized abdominal  vasculature. Borderline enlarged portacaval lymph node measuring 1 cm in short axis (axial image 17 of series 4) and hepatoduodenal ligament lymph node (axial image 14 of series 4) measuring 1.2 cm in short axis. Other: No significant volume of ascites noted in the visualized portions of the peritoneal cavity. Small fat containing umbilical hernia. Musculoskeletal: No aggressive appearing osseous lesions are noted in the visualized portions of the skeleton. IMPRESSION: 1. Cholelithiasis with evidence suggestive of acute cholecystitis. 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 Prominent borderline enlarged portacaval and hepatoduodenal ligament lymph nodes are noted. Attention to these at time of cholecystectomy is recommended, as the possibility of metastatic disease is not excluded. 2. Hepatic and splenic hemosiderosis. 3. Dilatation of the common bile duct without intrahepatic biliary ductal dilatation. This could be benign and age-related. No obstructing choledocholithiasis identified on today's examination. Additionally, there is no definite obstructing lesion in the pancreatic head or region of the ampulla. Notably, however, there is also some mild diffuse pancreatic ductal dilatation. If there is clinical concern for subtle neoplasm in the region of the pancreatic head or ampulla, repeat abdominal MRI with and without IV gadolinium with MRCP could be considered. Please note, however, that per report from the technologist, the patient refused to follow commands and refused to finish today's examination. Electronically Signed   By: Vinnie Langton M.D.   On: 01/17/2022 06:13   DG Chest Port 1 View  Result Date: 01/16/2022 CLINICAL DATA:  Sepsis EXAM: PORTABLE CHEST 1 VIEW COMPARISON:  12/26/2021 FINDINGS: Stable positioning of right IJ central line. Stable cardiomediastinal contours. No focal airspace  consolidation, pleural effusion, or pneumothorax. IMPRESSION: No active disease. Electronically Signed   By: Davina Poke D.O.   On: 01/16/2022 17:24   CT Angio Abd/Pel W and/or Wo Contrast  Result Date: 01/16/2022 CLINICAL DATA:  Lower GI bleed. EXAM: CTA ABDOMEN AND PELVIS WITHOUT AND WITH CONTRAST TECHNIQUE: Multidetector CT imaging of the abdomen and pelvis was performed using the standard protocol during bolus administration of intravenous contrast. Multiplanar reconstructed images and MIPs were obtained and reviewed to evaluate the vascular anatomy. RADIATION DOSE REDUCTION: This exam was performed according to the departmental dose-optimization program which includes automated exposure control, adjustment of the mA and/or kV according to patient size and/or use of iterative reconstruction technique. CONTRAST:  38mL OMNIPAQUE IOHEXOL 350 MG/ML SOLN COMPARISON:  CT abdomen pelvis dated 12/17/2021. FINDINGS: VASCULAR Aorta: Mild atherosclerotic calcification. No aneurysmal dilatation or dissection. The aorta is tortuous. Celiac: Patent without evidence of aneurysm, dissection, vasculitis or significant stenosis. SMA: Patent without evidence of aneurysm, dissection, vasculitis or significant stenosis. Renals: Both renal arteries are patent without evidence of aneurysm, dissection, vasculitis, fibromuscular dysplasia or significant stenosis. IMA: Patent without evidence of aneurysm, dissection, vasculitis or significant stenosis. Inflow: Mild atherosclerotic calcification. No aneurysmal dilatation or dissection. The iliac arteries are patent. Proximal Outflow: Bilateral common femoral and visualized portions of the superficial and profunda femoral arteries are patent without  evidence of aneurysm, dissection, vasculitis or significant stenosis. Veins: The IVC is unremarkable.  No portal venous gas. Review of the MIP images confirms the above findings. NON-VASCULAR Lower chest: There is a 9 mm right middle  lobe nodule. Follow-up as per recommendation of CT of 12/17/2021. The visualized lung bases are otherwise clear. No intra-abdominal free air or free fluid. Hepatobiliary: The liver is unremarkable. No intrahepatic biliary dilatation. Several noncalcified gallstones measure up to 2.7 cm. There is a 3.1 x 3.4 cm masslike thickening of the gallbladder fundus. Although this may represent adenomyomatosis, gallbladder malignancy is not excluded. Further characterization with MRI without and with contrast is recommended. There is gallbladder wall edema. Pancreas: Unremarkable. No pancreatic ductal dilatation or surrounding inflammatory changes. Spleen: Small splenic calcification. Adrenals/Urinary Tract: The adrenal glands unremarkable. There is no hydronephrosis or nephrolithiasis on either side. Focus of cortical calcification in the inferior pole of the right kidney. Several small left renal cysts and additional bilateral hypodense lesions which are too small to characterize. The kidneys are mildly atrophic. The visualized ureters and urinary bladder appear unremarkable. Stomach/Bowel: There is moderate stool throughout the colon. There is no bowel obstruction or active inflammation. The appendix is normal. No evidence of active GI bleed. Lymphatic: No adenopathy. Reproductive: The uterus and ovaries are grossly unremarkable. Other: None Musculoskeletal: Osteopenia with degenerative changes of the spine. There is extensive erosive changes with endplate irregularity, fragmentation, and sclerotic changes and associated soft tissue thickening involving L4-L5 most consistent with spondylo discitis. Further evaluation with MRI without and with contrast is recommended. IMPRESSION: 1. No evidence of active GI bleed. 2. Cholelithiasis with gallbladder wall edema. If there is clinical concern for acute cholecystitis, further evaluation with ultrasound or HIDA scan recommended. 3. Masslike thickening of the gallbladder fundus may  represent advanced adenomyomatosis versus neoplasm. Further evaluation with MRI without and with contrast is recommended. 4. 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 Further evaluation with lumbar spine MRI without and with contrast is recommended. 5. There is a 9 mm right middle lobe nodule. Follow-up as per recommendation of prior CT. 6. Aortic Atherosclerosis (ICD10-I70.0). Electronically Signed   By: Anner Crete M.D.   On: 01/16/2022 20:58    IMPRESSION/PLAN:  32) 71 year old female with acute on chronic anemia.  Hg 8.1 down from 10.6 two weeks ago. Heme + stool. No overt GI bleeding.  -Eventual EGD and colonoscopy, timing to be determined  -Monitor H&H closely -Transfuse for hemoglobin less than 7 -Clear liquid diet for now -PPI IV bid -Await further recommendations per Dr. Candis Schatz  2) Generalized abdominal pain.  Abd/pelvic CT showed cholelithiasis with a masslike thickening of the gallbladder fundus.  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  CBD dilatation without intrahepatic biliary ductal dilatation.  Mild diffuse pancreatic ductal dilatation was noted without an obvious pancreatic mass.  HIDA scan ordered, not yet completed.  On IV Rocephin and Flagyl. 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  On Vancomycin with dialysis -Recommend cardiology consult/repeat echo  4) Atrial flutter with rapid ventricular response-CHA2DS2-VASc 4 during 1/11 -12/30/2021 hospital admission. Patient was prescribed Eliquis at time of hospital discharge 12/30/2021, however, patient reported she never picked up this prescription.  5) ESRD on HD  6) Right pulmonary nodule measuring 9 mm  Patrecia Pour Kennedy-Smith  01/17/2022, 11:51AM  I have  taken a history, reviewed the chart and  examined the patient. I performed a substantive portion of this encounter, including complete performance of at least one of the key components, in conjunction with the APP. I agree with the APP's note, impression and recommendations  71 year old female with history of end-stage renal disease on dialysis, proximal A-fib, asthma and recent mission for MRSA bacteremia who presented to the emergency department yesterday with worsening chronic abdominal pain (patient tells me she has been having this pain since she started dialysis in September, but she is a very inconsistent historian), found to have CT findings concerning for gallbladder neoplasm.  She was also noted to have a drop in her hemoglobin (8.1 down from 10.6) and a positive FOBT.  She does not give a clear count of any overt GI bleeding. She has never had an upper or lower endoscopy before.  The patient warrants an upper and lower endoscopy in the setting of positive FOBT, anemia without overt GI bleeding.   We can proceed with this tomorrow.  Plan for bowel prep this evening and EGD/colonoscopy tomorrow unless this interferes with General Surgery plans.  Dr. Bryan Lemma will be taking over the service tomorrow.     Keylon Labelle E. Candis Schatz, MD [facility]

## 2022-01-17 NOTE — Assessment & Plan Note (Signed)
.   Resume patients home regimen of oral antihypertensives . Titrate antihypertensive regimen as necessary to achieve adequate BP control . PRN intravenous antihypertensives for excessively elevated blood pressure   

## 2022-01-17 NOTE — Progress Notes (Signed)
Pharmacy Antibiotic Note  Diamond [name] is a 71 y.o. female admitted on 01/16/2022 with bacteremia.  Pharmacy has been consulted for vancomycin dosing.  Patient with a history of gout, hypertension, paroxysmal atrial fibrillation on Eliquis, CKD on hemodialysis T/Th/S, asthma. Patient presenting after HD center concerned that patient may have sepsis. Pt found to have --Cholelithiasis/Gallbladder mass on MRCP and is on rocephin/flagyl.  [facility] 1/11-1/25/23 with sepsis secondary to MRSA bacteremia, L5-S1 discitis, endocarditis on vancomycin until 02/13/22  VT 2/7: 11.9 - Vancomycin increased to 750mg  HD  Plan: VR 14 w/ reported just under 3hr HD time yesterday prior to early termination - will give 750 mg dose now in assumption that post-HD dose not given Will continue vancomycin 750mg  with HD Rocephin/flagyl per MD Trend WBC, Fever, Renal function, & Clinical course F/u cultures, clinical course, WBC, fever F/u ID recommendations F/u Nephrology plan  Height: 5\' 7"  (170.2 cm) Weight: 65 kg (143 lb 4.8 oz) IBW/kg (Calculated) : 61.6  Temp (24hrs), Avg:99.4 F (37.4 C), Min:99.2 F (37.3 C), Max:99.6 F (37.6 C)  Recent Labs  Lab 01/16/22 1550 01/16/22 1605 01/17/22 0426 01/17/22 0631 01/17/22 1449 01/17/22 1719  WBC 4.3  --  4.5 4.1 4.6  --   CREATININE 2.26*  --  3.33*  --   --   --   LATICACIDVEN  --  1.7  --   --   --   --   VANCORANDOM  --   --   --   --   --  14    Estimated Creatinine Clearance: 14.9 mL/min (A) (by C-G formula based on SCr of 3.33 mg/dL (H)).    Allergies  Allergen Reactions   Shrimp (Diagnostic) Other (See Comments)    Listed on MAR   Antimicrobials this admission: Vancomycin unitl 02/13/22 rocephin 2/12 >>  flagyl 2/12 >>   Microbiology results: Pending  Thank you for allowing pharmacy to be a part of this patients care.  Lorelei Pont, PharmD, BCPS 01/17/2022 7:41 PM ED Clinical Pharmacist -  [phone]

## 2022-01-17 NOTE — ED Notes (Signed)
Blood transfusion held per MD, will reconsider transfusion following next CBC result

## 2022-01-17 NOTE — ED Notes (Signed)
Admitting doctor just called me asking me to give the evening lopressor and the hydralazine

## 2022-01-17 NOTE — Assessment & Plan Note (Signed)
·   Conservative management of this mild hypokalemia considering end-stage renal disease

## 2022-01-17 NOTE — Progress Notes (Addendum)
PROGRESS NOTE                                                                                                                                                                                                             Patient Demographics:    Diamond [name], is a 71 y.o. female, DOB - May 31, 1951, NFA:[id number]  Outpatient Primary MD for the patient is Osei-Bonsu, Iona Beard, MD    LOS - 0  Admit date - 01/16/2022    Chief Complaint  Patient presents with   Abdominal Pain       Brief Narrative (HPI from H&P) 71 year old African-American female with known past medical history of end-stage renal disease (HD Tues, Thurs, Sat), asthma, hypertension, anemia of chronic disease, atrial flutter (on eliquis) and recent diagnosis of suspected infective endocarditis with right atrial mass as well as MRSA bacteremia with lumbar discitis who presents to [facility] emergency department via EMS with complaints of abdominal pain.  In the ER was suggestive of Hemoccult positive stool, acute on chronic anemia along with possible cholecystitis and she was admitted.   Subjective:    Diamond [name] today has, No headache, No chest pain, mild abdominal pain - No Nausea, No new weakness tingling or numbness, no SOB.    Assessment  & Plan :    Acute abdominal pain mostly in the right upper quadrant.  Suspicious for acute or subacute cholecystitis - MRCP has been noted with possible cholecystitis and gallbladder mass -  GI and general surgery have been consulted, for now clear liquid diet, Rocephin and Flagyl antibiotic empiric coverage.  We will also order HIDA scan and have GI and general surgery manage this issue.  2.  Acute anemia with GI blood loss on top of anemia of chronic disease in the ESRD patient.  Baseline hemoglobin seems to be around 10, she was recently placed on Eliquis, unclear whether it is upper or lower GI blood loss but could  be subacute in nature, hold Eliquis, IV PPI twice daily.  GI consultation.  Monitor H&H closely if drops close to 7 will transfuse.  Patient has consented for transfusion.   3.  Acute infective endocarditis and lumbar discitis with MRSA bacteremia diagnosed recently.  On IV vancomycin with dialysis treatments.  Stop date 02/13/2022.  Pharmacy consulted.   4.  ESRD.  TTS  schedule nephrology consulted .  5. Atrial flutter with [place] vas 2 score of greater than 3.  Have doubled Lopressor dose, Eliquis on hold due to suspicion of GI bleed will monitor.  6.  Hypertension.  Beta-blocker dose doubled Norvasc added will monitor.  7.  History of gout.  On allopurinol continue.       Condition - Extremely Guarded  Family Communication  :    Glenna Fellows [phone] on 01/17/22  Daughter [name] [phone] on 01/17/2022 message left at 9:50 AM.   Code Status : Full  Consults  : GI, general surgery, nephrology  PUD Prophylaxis :  PPI   Procedures  :     MRCP - 1. Cholelithiasis with evidence suggestive of acute cholecystitis. 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 Prominent borderline enlarged portacaval and hepatoduodenal ligament lymph nodes are noted. Attention to these at time of cholecystectomy is recommended, as the possibility of metastatic disease is not excluded. 2. Hepatic and splenic hemosiderosis. 3. Dilatation of the common bile duct without intrahepatic biliary ductal dilatation. This could be benign and age-related. No obstructing choledocholithiasis identified on today's examination. Additionally, there is no definite obstructing lesion in the pancreatic head or region of the ampulla. Notably, however, there is also some mild diffuse pancreatic ductal dilatation.  CT - 1. No evidence of active GI bleed. 2. Cholelithiasis with gallbladder wall edema. If there is clinical  concern for acute cholecystitis, further evaluation with ultrasound or HIDA scan recommended. 3. Masslike thickening of the gallbladder fundus may represent advanced adenomyomatosis versus neoplasm. Further evaluation with MRI without and with contrast is recommended. 4. 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 Further evaluation with lumbar spine MRI without and with contrast is recommended. 5. There is a 9 mm right middle lobe nodule      Disposition Plan  :  Inpt  DVT Prophylaxis  :    Place and maintain sequential compression device Start: 01/17/22 0629 SCDs Start: 01/16/22 2258    Lab Results  Component Value Date   PLT 235 01/17/2022   PLT 234 01/17/2022    Diet :  Diet Order             Diet clear liquid Room service appropriate? Yes; Fluid consistency: Thin  Diet effective now                    Inpatient Medications  Scheduled Meds:  allopurinol  200 mg Oral Daily   amLODipine  10 mg Oral Daily   cyclobenzaprine  2.5 mg Oral Daily   gabapentin  100 mg Oral QHS   metoprolol tartrate  50 mg Oral BID   pantoprazole (PROTONIX) IV  40 mg Intravenous Q12H   senna-docusate  2 tablet Oral BID   sevelamer carbonate  1,600 mg Oral TID WC   Continuous Infusions:  [START ON 01/19/2022] vancomycin     PRN Meds:.acetaminophen **OR** acetaminophen, albuterol, hydrALAZINE, ondansetron **OR** ondansetron (ZOFRAN) IV, polyethylene glycol  Antibiotics  :    Anti-infectives (From admission, onward)    Start     Dose/Rate Route Frequency Ordered Stop   01/19/22 1200  vancomycin (VANCOREADY) IVPB 500 mg/100 mL        500 mg 100 mL/hr over 60 Minutes Intravenous Every T-Th-Sa (Hemodialysis) 01/16/22 2258          Time Spent in minutes  30   Lala Lund M.D on  01/17/2022 at 9:44 AM  To page go to [email or web address]   [facility] -  Office  [phone]  See all Orders from today for further details    Objective:   Vitals:   01/17/22 0500 01/17/22 0700  01/17/22 0718 01/17/22 0927  BP: (!) 184/101 (!) 161/109  (!) 177/96  Pulse: 87 85  91  Resp: 19 17  (!) 22  Temp:   99.2 F (37.3 C)   TempSrc:   Oral   SpO2: 98% 93%  99%  Weight:      Height:        Wt Readings from Last 3 Encounters:  01/16/22 65 kg  01/13/22 64.9 kg  12/29/21 65 kg    No intake or output data in the 24 hours ending 01/17/22 0944   Physical Exam  Awake Alert, No new F.N deficits, right IJ HD catheter in place [place].AT,PERRAL Supple Neck, No JVD,   Symmetrical Chest wall movement, Good air movement bilaterally, CTAB RRR,No Gallops,Rubs or new Murmurs,  +ve B.Sounds, Abd Soft, mild right upper quadrant abdominal tenderness, No Cyanosis, Clubbing or edema       Data Review:    CBC Recent Labs  Lab 01/16/22 1550 01/17/22 0426 01/17/22 0631  WBC 4.3 4.5 4.1  HGB 8.1* 7.0* 7.9*  HCT 26.6* 21.7* 25.2*  PLT 249 216 234   235  MCV 98.5 96.4 96.6  MCH 30.0 31.1 30.3  MCHC 30.5 32.3 31.3  RDW 15.6* 15.6* 15.7*  LYMPHSABS 0.8  --   --   MONOABS 0.4  --   --   EOSABS 0.1  --   --   BASOSABS 0.0  --   --     Electrolytes Recent Labs  Lab 01/16/22 1550 01/16/22 1605 01/17/22 0426 01/17/22 0629 01/17/22 0631  NA 137  --  136  --   --   K 3.1*  --  3.2*  --   --   CL 95*  --  96*  --   --   CO2 30  --  30  --   --   GLUCOSE 84  --  88  --   --   BUN 9  --  12  --   --   CREATININE 2.26*  --  3.33*  --   --   CALCIUM 8.5*  --  8.8*  --   --   AST 17  --  13*  --   --   ALT 11  --  9  --   --   ALKPHOS 114  --  100  --   --   BILITOT 0.4  --  0.5  --   --   ALBUMIN 2.3*  --  2.0*  --   --   MG  --   --  2.0  --   --   CRP  --   --  13.4* 13.1*  --   DDIMER  --   --   --   --  3.58*  LATICACIDVEN  --  1.7  --   --   --   INR  --  1.3*  --   --  1.2    ------------------------------------------------------------------------------------------------------------------ No results for input(s): CHOL, HDL, LDLCALC, TRIG, CHOLHDL, LDLDIRECT in  the last 72 hours.  Lab Results  Component Value Date   HGBA1C 5.4 10/18/2019    No results for input(s): TSH, T4TOTAL, T3FREE, THYROIDAB in the last 72 hours.  Invalid  input(s): FREET3 ------------------------------------------------------------------------------------------------------------------ ID Labs Recent Labs  Lab 01/16/22 1550 01/16/22 1605 01/17/22 0426 01/17/22 0629 01/17/22 0631  WBC 4.3  --  4.5  --  4.1  PLT 249  --  216  --  234   235  CRP  --   --  13.4* 13.1*  --   DDIMER  --   --   --   --  3.58*  LATICACIDVEN  --  1.7  --   --   --   CREATININE 2.26*  --  3.33*  --   --    Cardiac Enzymes No results for input(s): CKMB, TROPONINI, MYOGLOBIN in the last 168 hours.  Invalid input(s): CK   Radiology Reports MR ABDOMEN MRCP WO CONTRAST  Result Date: 01/17/2022 CLINICAL DATA:  71 year old female with history of abdominal pain and back pain. Possible cholecystitis noted on recent CT examination, in addition to mass-like thickening of the gallbladder fundus. Further evaluation. EXAM: MRI ABDOMEN WITHOUT CONTRAST  (INCLUDING MRCP) TECHNIQUE: Multiplanar multisequence MR imaging of the abdomen was performed. Heavily T2-weighted images of the biliary and pancreatic ducts were obtained, and three-dimensional MRCP images were rendered by post processing. COMPARISON:  No prior abdominal MRI. CTA of the abdomen and pelvis 01/16/2022. FINDINGS: Comment: Today's study is limited for detection and characterization of visceral and/or vascular lesions by lack of IV gadolinium. Lower chest: Unremarkable. Hepatobiliary: No definite suspicious cystic or solid hepatic lesions are confidently identified on today's noncontrast examination. There is diffuse low signal intensity throughout the hepatic parenchyma on in phase dual echo images, as well as on T2 weighted sequences, suggesting substantial hepatic iron deposition. No intrahepatic biliary ductal dilatation. Common bile duct is  mildly dilated measuring 9 mm in the porta hepatis. No filling defect within the common bile duct to suggest choledocholithiasis. There are numerous filling defects within the gallbladder, largest of which appears impacted in the neck of the gallbladder measuring up to 2.8 cm in diameter. Associated with this there is diffuse gallbladder wall thickening and edema, along with a trace amount of pericholecystic fluid. Notably, there is mass-like thickening of the fundus of the gallbladder (axial image 22 of series 4 and coronal image 23 of series 5) which is heterogeneous in signal intensity on T1 and T2 weighted images and demonstrates internal areas of diffusion restriction, concerning for gallbladder neoplasm (poorly evaluated on today's noncontrast examination). Pancreas: No definite pancreatic mass confidently identified on today's noncontrast examination. Diffuse pancreatic ductal dilatation is noted, with the pancreatic duct measuring up to 5 mm in the body of the pancreas. No peripancreatic fluid collections or inflammatory changes. Spleen: Diffuse low signal intensity throughout the splenic parenchyma on in phase dual echo images and T2 weighted sequences, indicative of splenic iron deposition. Adrenals/Urinary Tract: Numerous T1 hypointense, T2 hyperintense lesions are noted in the kidneys bilaterally, incompletely characterized on today's noncontrast examination, but likely to represent cysts, largest of which is exophytic measuring up to 2.6 cm in the anterolateral aspect of the lower pole of the left kidney. No hydroureteronephrosis in the visualized portions of the abdomen. Bilateral adrenal glands are normal in appearance. Stomach/Bowel: Visualized portions are unremarkable. Vascular/Lymphatic: No aneurysm identified in the visualized abdominal vasculature. Borderline enlarged portacaval lymph node measuring 1 cm in short axis (axial image 17 of series 4) and hepatoduodenal ligament lymph node (axial  image 14 of series 4) measuring 1.2 cm in short axis. Other: No significant volume of ascites noted in the visualized portions of the peritoneal cavity.  Small fat containing umbilical hernia. Musculoskeletal: No aggressive appearing osseous lesions are noted in the visualized portions of the skeleton. IMPRESSION: 1. Cholelithiasis with evidence suggestive of acute cholecystitis. 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 Prominent borderline enlarged portacaval and hepatoduodenal ligament lymph nodes are noted. Attention to these at time of cholecystectomy is recommended, as the possibility of metastatic disease is not excluded. 2. Hepatic and splenic hemosiderosis. 3. Dilatation of the common bile duct without intrahepatic biliary ductal dilatation. This could be benign and age-related. No obstructing choledocholithiasis identified on today's examination. Additionally, there is no definite obstructing lesion in the pancreatic head or region of the ampulla. Notably, however, there is also some mild diffuse pancreatic ductal dilatation. If there is clinical concern for subtle neoplasm in the region of the pancreatic head or ampulla, repeat abdominal MRI with and without IV gadolinium with MRCP could be considered. Please note, however, that per report from the technologist, the patient refused to follow commands and refused to finish today's examination. Electronically Signed   By: Vinnie Langton M.D.   On: 01/17/2022 06:13   DG Chest Port 1 View  Result Date: 01/16/2022 CLINICAL DATA:  Sepsis EXAM: PORTABLE CHEST 1 VIEW COMPARISON:  12/26/2021 FINDINGS: Stable positioning of right IJ central line. Stable cardiomediastinal contours. No focal airspace consolidation, pleural effusion, or pneumothorax. IMPRESSION: No active disease. Electronically Signed   By: Davina Poke D.O.   On: 01/16/2022 17:24    CT Angio Abd/Pel W and/or Wo Contrast  Result Date: 01/16/2022 CLINICAL DATA:  Lower GI bleed. EXAM: CTA ABDOMEN AND PELVIS WITHOUT AND WITH CONTRAST TECHNIQUE: Multidetector CT imaging of the abdomen and pelvis was performed using the standard protocol during bolus administration of intravenous contrast. Multiplanar reconstructed images and MIPs were obtained and reviewed to evaluate the vascular anatomy. RADIATION DOSE REDUCTION: This exam was performed according to the departmental dose-optimization program which includes automated exposure control, adjustment of the mA and/or kV according to patient size and/or use of iterative reconstruction technique. CONTRAST:  89mL OMNIPAQUE IOHEXOL 350 MG/ML SOLN COMPARISON:  CT abdomen pelvis dated 12/17/2021. FINDINGS: VASCULAR Aorta: Mild atherosclerotic calcification. No aneurysmal dilatation or dissection. The aorta is tortuous. Celiac: Patent without evidence of aneurysm, dissection, vasculitis or significant stenosis. SMA: Patent without evidence of aneurysm, dissection, vasculitis or significant stenosis. Renals: Both renal arteries are patent without evidence of aneurysm, dissection, vasculitis, fibromuscular dysplasia or significant stenosis. IMA: Patent without evidence of aneurysm, dissection, vasculitis or significant stenosis. Inflow: Mild atherosclerotic calcification. No aneurysmal dilatation or dissection. The iliac arteries are patent. Proximal Outflow: Bilateral common femoral and visualized portions of the superficial and profunda femoral arteries are patent without evidence of aneurysm, dissection, vasculitis or significant stenosis. Veins: The IVC is unremarkable.  No portal venous gas. Review of the MIP images confirms the above findings. NON-VASCULAR Lower chest: There is a 9 mm right middle lobe nodule. Follow-up as per recommendation of CT of 12/17/2021. The visualized lung bases are otherwise clear. No intra-abdominal free air or free fluid.  Hepatobiliary: The liver is unremarkable. No intrahepatic biliary dilatation. Several noncalcified gallstones measure up to 2.7 cm. There is a 3.1 x 3.4 cm masslike thickening of the gallbladder fundus. Although this may represent adenomyomatosis, gallbladder malignancy is not excluded. Further characterization with MRI without and with contrast is recommended. There is gallbladder wall edema. Pancreas: Unremarkable. No pancreatic ductal dilatation or surrounding inflammatory changes. Spleen:  Small splenic calcification. Adrenals/Urinary Tract: The adrenal glands unremarkable. There is no hydronephrosis or nephrolithiasis on either side. Focus of cortical calcification in the inferior pole of the right kidney. Several small left renal cysts and additional bilateral hypodense lesions which are too small to characterize. The kidneys are mildly atrophic. The visualized ureters and urinary bladder appear unremarkable. Stomach/Bowel: There is moderate stool throughout the colon. There is no bowel obstruction or active inflammation. The appendix is normal. No evidence of active GI bleed. Lymphatic: No adenopathy. Reproductive: The uterus and ovaries are grossly unremarkable. Other: None Musculoskeletal: Osteopenia with degenerative changes of the spine. There is extensive erosive changes with endplate irregularity, fragmentation, and sclerotic changes and associated soft tissue thickening involving L4-L5 most consistent with spondylo discitis. Further evaluation with MRI without and with contrast is recommended. IMPRESSION: 1. No evidence of active GI bleed. 2. Cholelithiasis with gallbladder wall edema. If there is clinical concern for acute cholecystitis, further evaluation with ultrasound or HIDA scan recommended. 3. Masslike thickening of the gallbladder fundus may represent advanced adenomyomatosis versus neoplasm. Further evaluation with MRI without and with contrast is recommended. 4. Findings of spondylo discitis  at L4-L5. Further evaluation with lumbar spine MRI without and with contrast is recommended. 5. There is a 9 mm right middle lobe nodule. Follow-up as per recommendation of prior CT. 6. Aortic Atherosclerosis (ICD10-I70.0). Electronically Signed   By: Anner Crete M.D.   On: 01/16/2022 20:58

## 2022-01-17 NOTE — ED Notes (Signed)
Clear liquid lunch tray ordered 

## 2022-01-17 NOTE — Assessment & Plan Note (Signed)
·   Patient receives hemodialysis in the outpatient setting on Tuesday Thursday Saturday  If patient is to remain in the hospital on Tuesday we will consult nephrology for resumption of hemodialysis

## 2022-01-17 NOTE — ED Notes (Signed)
Hydralazine IVP given for bp. Will continue to monitor.

## 2022-01-17 NOTE — ED Notes (Signed)
The pts diaper was changed  she had a formed light brown stool  she has redness around both sides of her buttocks  stage one  barrier cream  and a pad placed in the   area  linen changed

## 2022-01-17 NOTE — ED Notes (Signed)
Pt returned from mri

## 2022-01-17 NOTE — ED Notes (Signed)
Unable to draw labs from pt IV. Will notify phlebotomist to draw remaining labs at this time.

## 2022-01-17 NOTE — Assessment & Plan Note (Signed)
·   CT imaging of the abdomen and pelvis once again has identified an abnormality of the gallbladder with some degree of gallbladder thickening seen since 09/2021.    This time, imaging, reveals a 3.1 x 3.4 cm masslike thickening of the gallbladder fundus.  Gallbladder malignancy is possible.  Right upper quadrant nontender  LFTs unremarkable  Obtaining MRCP to evaluate further.

## 2022-01-17 NOTE — Consult Note (Signed)
Consulting Physician: Nickola Major Wilbern Pennypacker  Referring Provider: Lala Lund, MD  Chief Complaint: Abdominal pain  Reason for Consult: Possible cholecystitis   Subjective   HPI: Diamond [name] is an 71 y.o. female who says she is here because her kidney doctors at the dialysis center thought her kidneys were infected.  She does not really know why she is here.  She has a lot of lumbar back pain that wraps around both hips.  She denies any abdominal pain.  She denies any abdominal pain after eating fatty foods.  She does not avoid any fatty foods and enjoys foods like ice cream, cheese burgers, and fried foods, without issue.  Past Medical History:  Diagnosis Date   Asthma    Atrial flutter with rapid ventricular response ([place]) 12/25/2021   Chronic kidney disease    dialysis Tues Thurs Sat   Dysrhythmia    PAF 10/2019 in setting of COVID-19 PNA   Gout    Hypertension     Past Surgical History:  Procedure Laterality Date   AV FISTULA PLACEMENT Left 12/23/2021   Procedure: LEFT ARM BRACHIOBASILIC VEIN ARTERIOVENOUS (AV) FISTULA CREATION;  Surgeon: [name], MD;  Location: [place];  Service: Vascular;  Laterality: Left;   [place] Right 12/23/2021   Procedure: INSERTION OF DIALYSIS CATHETER;  Surgeon: [name], MD;  Location: [place];  Service: Vascular;  Laterality: Right;   TEE WITHOUT CARDIOVERSION N/A 12/22/2021   Procedure: TRANSESOPHAGEAL ECHOCARDIOGRAM (TEE);  Surgeon: Jerline Pain, MD;  Location: [facility] ENDOSCOPY;  Service: Cardiovascular;  Laterality: N/A;   ULTRASOUND GUIDANCE FOR VASCULAR ACCESS  12/23/2021   Procedure: ULTRASOUND GUIDANCE FOR VASCULAR ACCESS;  Surgeon: [name], MD;  Location: [facility] OR;  Service: Vascular;;    Family History  Problem Relation Age of Onset   Hypertension Mother    Hypertension Father    Colon cancer Brother    Lung cancer Brother     Social:  reports that she has never  smoked. She has never used smokeless tobacco. She reports that she does not drink alcohol and does not use drugs.  Allergies:  Allergies  Allergen Reactions   Shrimp (Diagnostic) Other (See Comments)    Listed on MAR    Medications: Current Outpatient Medications  Medication Instructions   acetaminophen (TYLENOL) 1,000 mg, Oral, See admin instructions, 1000 mg twice daily scheduled. May take an additional 1000 mg daily as needed for pain.   albuterol (VENTOLIN HFA) 108 (90 Base) MCG/ACT inhaler 2 puffs, Inhalation, Every 4 hours PRN   allopurinol (ZYLOPRIM) 200 mg, Oral, Daily   apixaban (ELIQUIS) 5 mg, Oral, 2 times daily   colchicine 0.6 mg, Oral, Daily PRN   cyclobenzaprine (FLEXERIL) 2.5 mg, Oral, Daily   gabapentin (NEURONTIN) 100 mg, Oral, Nightly   hydrALAZINE (APRESOLINE) 50 mg, Oral, Every 8 hours   hydrOXYzine (ATARAX) 12.5 mg, Oral, Every 6 hours PRN, For 12 days   Lidocaine 4 % PTCH 1 patch, Apply externally, Daily   metoprolol tartrate (LOPRESSOR) 50 mg, Oral, 2 times daily   ondansetron (ZOFRAN) 4 mg, Oral, Every 8 hours PRN   oxyCODONE (OXY IR/ROXICODONE) 5 mg, Oral, Every 6 hours PRN   polyethylene glycol (MIRALAX / GLYCOLAX) 17 g, Oral, Daily PRN   senna-docusate (SENOKOT-S) 8.6-50 MG tablet 2 tablets, Oral, 2 times daily   sevelamer carbonate (RENVELA) 1,600 mg, Oral, 3 times daily with meals   Skin Protectants,  Misc. (EUCERIN) cream 1 application, Topical, Daily   vancomycin (VANCOREADY) 500 mg, Intravenous, Every T-Th-Sa (Hemodialysis)    ROS - all of the below systems have been reviewed with the patient and positives are indicated with bold text General: chills, fever or night sweats Eyes: blurry vision or double vision ENT: epistaxis or sore throat Allergy/Immunology: itchy/watery eyes or nasal congestion Hematologic/Lymphatic: bleeding problems, blood clots or swollen lymph nodes Endocrine: temperature intolerance or unexpected weight changes Breast: new  or changing breast lumps or nipple discharge Resp: cough, shortness of breath, or wheezing CV: chest pain or dyspnea on exertion GI: as per HPI GU: dysuria, trouble voiding, or hematuria MSK: joint pain or joint stiffness Neuro: TIA or stroke symptoms Derm: pruritus and skin lesion changes Psych: anxiety and depression  Objective   PE Blood pressure (!) 215/111, pulse 86, temperature 99.2 F (37.3 C), temperature source Oral, resp. rate 16, height 5\' 7"  (1.702 m), weight 65 kg, SpO2 99 %. Constitutional: NAD; conversant; no deformities Eyes: Moist conjunctiva; no lid lag; anicteric; PERRL Neck: Trachea midline; no thyromegaly Lungs: Normal respiratory effort; no tactile fremitus CV: RRR; no palpable thrills; no pitting edema GI: Abd soft, nontender, nondistended negative Murphy sign; no palpable hepatosplenomegaly MSK: Normal range of motion of extremities; no clubbing/cyanosis Psychiatric: Appropriate affect; alert and oriented x3 Lymphatic: No palpable cervical or axillary lymphadenopathy  Results for orders placed or performed during the hospital encounter of 01/16/22 (from the past 24 hour(s))  Resp Panel by RT-PCR (Flu A&B, Covid) Nasopharyngeal Swab     Status: None   Collection Time: 01/16/22  9:47 PM   Specimen: Nasopharyngeal Swab; Nasopharyngeal(NP) swabs in vial transport medium  Result Value Ref Range   SARS Coronavirus 2 by RT PCR NEGATIVE NEGATIVE   Influenza A by PCR NEGATIVE NEGATIVE   Influenza B by PCR NEGATIVE NEGATIVE  CBG monitoring, ED     Status: None   Collection Time: 01/17/22 12:45 AM  Result Value Ref Range   Glucose-Capillary 94 70 - 99 mg/dL  Comprehensive metabolic panel     Status: Abnormal   Collection Time: 01/17/22  4:26 AM  Result Value Ref Range   Sodium 136 135 - 145 mmol/L   Potassium 3.2 (L) 3.5 - 5.1 mmol/L   Chloride 96 (L) 98 - 111 mmol/L   CO2 30 22 - 32 mmol/L   Glucose, Bld 88 70 - 99 mg/dL   BUN 12 8 - 23 mg/dL   Creatinine,  Ser 3.33 (H) 0.44 - 1.00 mg/dL   Calcium 8.8 (L) 8.9 - 10.3 mg/dL   Total Protein 5.8 (L) 6.5 - 8.1 g/dL   Albumin 2.0 (L) 3.5 - 5.0 g/dL   AST 13 (L) 15 - 41 U/L   ALT 9 0 - 44 U/L   Alkaline Phosphatase 100 38 - 126 U/L   Total Bilirubin 0.5 0.3 - 1.2 mg/dL   GFR, Estimated 14 (L) >60 mL/min   Anion gap 10 5 - 15  Magnesium     Status: None   Collection Time: 01/17/22  4:26 AM  Result Value Ref Range   Magnesium 2.0 1.7 - 2.4 mg/dL  CBC     Status: Abnormal   Collection Time: 01/17/22  4:26 AM  Result Value Ref Range   WBC 4.5 4.0 - 10.5 K/uL   RBC 2.25 (L) 3.87 - 5.11 MIL/uL   Hemoglobin 7.0 (L) 12.0 - 15.0 g/dL   HCT 21.7 (L) 36.0 - 46.0 %   MCV  96.4 80.0 - 100.0 fL   MCH 31.1 26.0 - 34.0 pg   MCHC 32.3 30.0 - 36.0 g/dL   RDW 15.6 (H) 11.5 - 15.5 %   Platelets 216 150 - 400 K/uL   nRBC 0.0 0.0 - 0.2 %  C-reactive protein     Status: Abnormal   Collection Time: 01/17/22  4:26 AM  Result Value Ref Range   CRP 13.4 (H) <1.0 mg/dL  C-reactive protein     Status: Abnormal   Collection Time: 01/17/22  6:29 AM  Result Value Ref Range   CRP 13.1 (H) <1.0 mg/dL  Lactate dehydrogenase     Status: Abnormal   Collection Time: 01/17/22  6:29 AM  Result Value Ref Range   LDH 231 (H) 98 - 192 U/L  Vitamin B12     Status: None   Collection Time: 01/17/22  6:29 AM  Result Value Ref Range   Vitamin B-12 571 180 - 914 pg/mL  Iron and TIBC     Status: Abnormal   Collection Time: 01/17/22  6:29 AM  Result Value Ref Range   Iron 38 28 - 170 ug/dL   TIBC 154 (L) 250 - 450 ug/dL   Saturation Ratios 25 10.4 - 31.8 %   UIBC 116 ug/dL  Ferritin     Status: Abnormal   Collection Time: 01/17/22  6:29 AM  Result Value Ref Range   Ferritin 1,471 (H) 11 - 307 ng/mL  CBC     Status: Abnormal   Collection Time: 01/17/22  6:31 AM  Result Value Ref Range   WBC 4.1 4.0 - 10.5 K/uL   RBC 2.61 (L) 3.87 - 5.11 MIL/uL   Hemoglobin 7.9 (L) 12.0 - 15.0 g/dL   HCT 25.2 (L) 36.0 - 46.0 %   MCV  96.6 80.0 - 100.0 fL   MCH 30.3 26.0 - 34.0 pg   MCHC 31.3 30.0 - 36.0 g/dL   RDW 15.7 (H) 11.5 - 15.5 %   Platelets 235 150 - 400 K/uL   nRBC 0.0 0.0 - 0.2 %  DIC Panel ONCE - STAT     Status: Abnormal   Collection Time: 01/17/22  6:31 AM  Result Value Ref Range   Prothrombin Time 14.9 11.4 - 15.2 seconds   INR 1.2 0.8 - 1.2   aPTT 42 (H) 24 - 36 seconds   Fibrinogen 596 (H) 210 - 475 mg/dL   D-Dimer, Quant 3.58 (H) 0.00 - 0.50 ug/mL-FEU   Platelets 234 150 - 400 K/uL   Smear Review NO SCHISTOCYTES SEEN   Reticulocytes     Status: Abnormal   Collection Time: 01/17/22  6:33 AM  Result Value Ref Range   Retic Ct Pct 2.4 0.4 - 3.1 %   RBC. 2.61 (L) 3.87 - 5.11 MIL/uL   Retic Count, Absolute 61.3 19.0 - 186.0 K/uL   Immature Retic Fract 8.7 2.3 - 15.9 %  Folate     Status: None   Collection Time: 01/17/22  7:00 AM  Result Value Ref Range   Folate 10.6 >5.9 ng/mL  Type and screen [place]     Status: None (Preliminary result)   Collection Time: 01/17/22  7:31 AM  Result Value Ref Range   ABO/RH(D) O POS    Antibody Screen NEG    Sample Expiration      01/20/2022,2359 Performed at [facility], [address], [place],  [address]    Unit Number [id number]  Blood Component Type RED CELLS,LR    Unit division 00    Status of Unit ALLOCATED    Transfusion Status OK TO TRANSFUSE    Crossmatch Result Compatible   MRSA Next Gen by PCR, Nasal     Status: Abnormal   Collection Time: 01/17/22  7:40 AM   Specimen: Nasal Mucosa; Nasal Swab  Result Value Ref Range   MRSA by PCR Next Gen DETECTED (A) NOT DETECTED  Prepare RBC (crossmatch)     Status: None   Collection Time: 01/17/22  9:47 AM  Result Value Ref Range   Order Confirmation      ORDERS RECEIVED TO CROSSMATCH Performed at [facility], [address], [place], [place] [address]   CBC     Status: Abnormal   Collection Time: 01/17/22  2:49 PM  Result Value Ref Range   WBC 4.6  4.0 - 10.5 K/uL   RBC 2.50 (L) 3.87 - 5.11 MIL/uL   Hemoglobin 7.8 (L) 12.0 - 15.0 g/dL   HCT 24.1 (L) 36.0 - 46.0 %   MCV 96.4 80.0 - 100.0 fL   MCH 31.2 26.0 - 34.0 pg   MCHC 32.4 30.0 - 36.0 g/dL   RDW 15.7 (H) 11.5 - 15.5 %   Platelets 237 150 - 400 K/uL   nRBC 0.0 0.0 - 0.2 %     Imaging Orders          DG Chest Port 1 View    No active disease.       CT Angio Abd/Pel W and/or Wo Contrast    1. No evidence of active GI bleed. 2. Cholelithiasis with gallbladder wall edema. If there is clinical concern for acute cholecystitis, further evaluation with ultrasound or HIDA scan recommended. 3. Masslike thickening of the gallbladder fundus may represent advanced adenomyomatosis versus neoplasm. Further evaluation with MRI without and with contrast is recommended. 4. 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 Further evaluation with lumbar spine MRI without and with contrast is recommended. 5. There is a 9 mm right middle lobe nodule. Follow-up as per recommendation of prior CT. 6. Aortic Atherosclerosis (ICD10-I70.0).       MR ABDOMEN MRCP WO CONTRAST    1. Cholelithiasis with evidence suggestive of acute cholecystitis. 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 Prominent borderline enlarged portacaval and hepatoduodenal ligament lymph nodes are noted. Attention to these at time of cholecystectomy is recommended, as the possibility of metastatic disease is not excluded. 2. Hepatic and splenic hemosiderosis. 3. Dilatation of the common bile duct without intrahepatic biliary ductal dilatation. This could be benign and age-related. No obstructing choledocholithiasis identified on today's examination. Additionally, there is no definite obstructing lesion in the pancreatic head or region of the ampulla. Notably, however, there is also some mild diffuse pancreatic ductal  dilatation. If there is clinical concern for subtle neoplasm in the region of the pancreatic head or ampulla, repeat abdominal MRI with and without IV gadolinium with MRCP could be considered. Please note, however, that per report from the technologist, the patient refused to follow commands and refused to finish today's examination.       NM Hepato W/EF    Pending  Assessment and Plan   Diamond [name] is an 71 y.o. female with abdominal pain.  CT and MRI are concerning for cholecystitis or a gallbadder cancer.  It will be interesting to see the results  of the HIDA scan.    Ms. Giese 43 and has a lot going on with concern for anemia, possibly due to a GI bleed or chronic disease.  Possible infective endocarditis.  Possible lumbar discitis with MRSA bacteremia.  I am not sure where on her list of priorities the abnormal gallbladder findings fall, but in talking with her she says she is sort of fed up with everything and just wants to stop looking for problems with tests and is not very interested in undergoing a surgery.  I would not expect the gallbladder to cause a MRSA bacteremia.  She does not appear to have any symptoms related to her gallbladder or exam findings consistent with acute cholecystitis.  The surgery team will follow and be available to support her as needed, however we obviously would not proceed with surgery unless this was consistent with her goals of care.  Felicie Morn, MD  [facility] Use [email or web address] to contact on call provider  New Patient Billing: [phone] - High MDM

## 2022-01-17 NOTE — Assessment & Plan Note (Addendum)
·   While patient initially presented due to ongoing back and abdominal pain, patient has been identified to have a significant drop in hemoglobin on initial evaluation  Hemoglobin currently 8.1, down from 10.6 three weeks ago  Patient denies any melena or frank bleeding  Stool Hemoccult performed in the emergency department is positive  While patient was recently prescribed Eliquis during last hospitalization and it is noted on patient's discharge summary patient denies ever having been prescribed this drug has yet to take it.  CT angiogram of the abdomen and pelvis does not reveal any evidence of active bleeding.  Performing serial CBCs  We will transfuse and obtain GI consultation if hemoglobin drops below 7  Since there is no evidence of gross clinically substantial bleeding at this point will resume Eliquis at this time to ensure that patient can safely tolerate this drug prior to discharge.

## 2022-01-17 NOTE — ED Notes (Signed)
The pt was moved to this room after spending the night shift in the other part of the ed.  I took care of her all night until this am.  The pt is more alert  conversing with family.  She lookd so much better than last pm

## 2022-01-17 NOTE — Assessment & Plan Note (Addendum)
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  Currently receiving IV vancomycin with hemodialysis through 3/11  Patient was also to be receiving Eliquis after recent discharge but denies ever receiving this drug  Despite patient's complaints of ongoing low back pain radiating along the right flank around to the abdomen, patient is not exhibiting any evidence of leukocytosis or fever on arrival.  Obtaining repeat blood cultures to assess for persistent MRSA bacteremia  Obtaining inflammatory markers  If patient exhibits evidence of persisting infection will consider further workup.

## 2022-01-17 NOTE — Progress Notes (Incomplete Revision)
PROGRESS NOTE                                                                                                                                                                                                             [name] Demographics:    Diamond [name], is a 71 y.o. 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  In the ER was suggestive of Hemoccult positive stool, acute on chronic anemia along with possible cholecystitis and she was admitted.   Subjective:    Diamond [name] today has, No headache, No chest pain, mild abdominal pain - No Nausea, No new weakness tingling or numbness, no SOB.    Assessment  & Plan :    Acute abdominal pain mostly in the right upper quadrant.  Suspicious for acute or subacute cholecystitis - MRCP has been noted with possible cholecystitis and gallbladder mass -  GI and general surgery have been consulted, for now clear liquid diet, Rocephin and Flagyl antibiotic empiric coverage.  We will also order HIDA scan and have GI and general surgery manage this issue.  2.  Acute anemia with GI blood loss on top of anemia of chronic disease in the ESRD [name].  Baseline hemoglobin seems to be around 10, she was recently placed on Eliquis, unclear whether it is upper or lower GI blood loss but could  be subacute in nature, hold Eliquis, IV PPI twice daily.  GI consultation.  Monitor H&H closely if drops close to 7 will transfuse.  [name] has consented for transfusion.   3.  Acute infective endocarditis and lumbar discitis with MRSA bacteremia diagnosed recently.  On IV vancomycin with dialysis treatments.  Stop date 02/13/2022.  Pharmacy consulted.   4.  ESRD.  TTS  schedule nephrology consulted .  5. Atrial flutter with [place] vas 2 score of greater than 3.  Have doubled Lopressor dose, Eliquis on hold due to suspicion of GI bleed will monitor.  6.  Hypertension.  Beta-blocker dose doubled Norvasc added will monitor.  7.  History of gout.  On allopurinol continue.       Condition - Extremely Guarded  Family Communication  :    Diamond [name] [phone] on 01/17/22  Daughter Diamond [name] [phone] on 01/17/2022 message left at 9:50 AM.   Code Status : Full  Consults  : GI, general surgery, nephrology  PUD Prophylaxis :  PPI   Procedures  :     MRCP - 1. Cholelithiasis with evidence suggestive of acute cholecystitis. 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 Prominent borderline enlarged portacaval and hepatoduodenal ligament lymph nodes are noted. Attention to these at time of cholecystectomy is recommended, as the possibility of metastatic disease is not excluded. 2. Hepatic and splenic hemosiderosis. 3. Dilatation of the common bile duct without intrahepatic biliary ductal dilatation. This could be benign and age-related. No obstructing choledocholithiasis identified on today's examination. Additionally, there is no definite obstructing lesion in the pancreatic head or region of the ampulla. Notably, however, there is also some mild diffuse pancreatic ductal dilatation.  CT - 1. No evidence of active GI bleed. 2. Cholelithiasis with gallbladder wall edema. If there is clinical  concern for acute cholecystitis, further evaluation with ultrasound or HIDA scan recommended. 3. Masslike thickening of the gallbladder fundus may represent advanced adenomyomatosis versus neoplasm. Further evaluation with MRI without and with contrast is recommended. 4. 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 Further evaluation with lumbar spine MRI without and with contrast is recommended. 5. There is a 9 mm right middle lobe nodule      Disposition Plan  :  Inpt  DVT Prophylaxis  :    Place and maintain sequential compression device Start: 01/17/22 0629 SCDs Start: 01/16/22 2258    Lab Results  Component Value Date   PLT 235 01/17/2022   PLT 234 01/17/2022    Diet :  Diet Order             Diet clear liquid Room service appropriate? Yes; Fluid consistency: Thin  Diet effective now                    Inpatient Medications  Scheduled Meds:  allopurinol  200 mg Oral Daily   amLODipine  10 mg Oral Daily   cyclobenzaprine  2.5 mg Oral Daily   gabapentin  100 mg Oral QHS   metoprolol tartrate  50 mg Oral BID   pantoprazole (PROTONIX) IV  40 mg Intravenous Q12H   senna-docusate  2 tablet Oral BID   sevelamer carbonate  1,600 mg Oral TID WC   Continuous Infusions:  [START ON 01/19/2022] vancomycin     PRN Meds:.acetaminophen **OR** acetaminophen, albuterol, hydrALAZINE, ondansetron **OR** ondansetron (ZOFRAN) IV, polyethylene glycol  Antibiotics  :    Anti-infectives (From admission, onward)    Start     Dose/Rate Route Frequency Ordered Stop   01/19/22 1200  vancomycin (VANCOREADY) IVPB 500 mg/100 mL        500 mg 100 mL/hr over 60 Minutes Intravenous Every T-Th-Sa (Hemodialysis) 01/16/22 2258          Time Spent in minutes  30   Lala Lund M.D on  01/17/2022 at 9:44 AM  To page go to [email or web address]   [facility] -  Office  [phone]  See all Orders from today for further details    Objective:   Vitals:   01/17/22 0500 01/17/22 0700  01/17/22 0718 01/17/22 0927  BP: (!) 184/101 (!) 161/109  (!) 177/96  Pulse: 87 85  91  Resp: 19 17  (!) 22  Temp:   99.2 F (37.3 C)   TempSrc:   Oral   SpO2: 98% 93%  99%  Weight:      Height:        Wt Readings from Last 3 Encounters:  01/16/22 65 kg  01/13/22 64.9 kg  12/29/21 65 kg    No intake or output data in the 24 hours ending 01/17/22 0944   Physical Exam  Awake Alert, No new F.N deficits, right IJ HD catheter in place .AT,PERRAL Supple Neck, No JVD,   Symmetrical Chest wall movement, Good air movement bilaterally, CTAB RRR,No Gallops,Rubs or new Murmurs,  +ve B.Sounds, Abd Soft, mild right upper quadrant abdominal tenderness, No Cyanosis, Clubbing or edema       Data Review:    CBC Recent Labs  Lab 01/16/22 1550 01/17/22 0426 01/17/22 0631  WBC 4.3 4.5 4.1  HGB 8.1* 7.0* 7.9*  HCT 26.6* 21.7* 25.2*  PLT 249 216 234   235  MCV 98.5 96.4 96.6  MCH 30.0 31.1 30.3  MCHC 30.5 32.3 31.3  RDW 15.6* 15.6* 15.7*  LYMPHSABS 0.8  --   --   MONOABS 0.4  --   --   EOSABS 0.1  --   --   BASOSABS 0.0  --   --     Electrolytes Recent Labs  Lab 01/16/22 1550 01/16/22 1605 01/17/22 0426 01/17/22 0629 01/17/22 0631  NA 137  --  136  --   --   K 3.1*  --  3.2*  --   --   CL 95*  --  96*  --   --   CO2 30  --  30  --   --   GLUCOSE 84  --  88  --   --   BUN 9  --  12  --   --   CREATININE 2.26*  --  3.33*  --   --   CALCIUM 8.5*  --  8.8*  --   --   AST 17  --  13*  --   --   ALT 11  --  9  --   --   ALKPHOS 114  --  100  --   --   BILITOT 0.4  --  0.5  --   --   ALBUMIN 2.3*  --  2.0*  --   --   MG  --   --  2.0  --   --   CRP  --   --  13.4* 13.1*  --   DDIMER  --   --   --   --  3.58*  LATICACIDVEN  --  1.7  --   --   --   INR  --  1.3*  --   --  1.2    ------------------------------------------------------------------------------------------------------------------ No results for input(s): CHOL, HDL, LDLCALC, TRIG, CHOLHDL, LDLDIRECT in  the last 72 hours.  Lab Results  Component Value Date   HGBA1C 5.4 10/18/2019    No results for input(s): TSH, T4TOTAL, T3FREE, THYROIDAB in the last 72 hours.  Invalid  input(s): FREET3 ------------------------------------------------------------------------------------------------------------------ ID Labs Recent Labs  Lab 01/16/22 1550 01/16/22 1605 01/17/22 0426 01/17/22 0629 01/17/22 0631  WBC 4.3  --  4.5  --  4.1  PLT 249  --  216  --  234   235  CRP  --   --  13.4* 13.1*  --   DDIMER  --   --   --   --  3.58*  LATICACIDVEN  --  1.7  --   --   --   CREATININE 2.26*  --  3.33*  --   --    Cardiac Enzymes No results for input(s): CKMB, TROPONINI, MYOGLOBIN in the last 168 hours.  Invalid input(s): CK   Radiology Reports MR ABDOMEN MRCP WO CONTRAST  Result Date: 01/17/2022 CLINICAL DATA:  71 year old female with history of abdominal pain and back pain. Possible cholecystitis noted on recent CT examination, in addition to mass-like thickening of the gallbladder fundus. Further evaluation. EXAM: MRI ABDOMEN WITHOUT CONTRAST  (INCLUDING MRCP) TECHNIQUE: Multiplanar multisequence MR imaging of the abdomen was performed. Heavily T2-weighted images of the biliary and pancreatic ducts were obtained, and three-dimensional MRCP images were rendered by post processing. COMPARISON:  No prior abdominal MRI. CTA of the abdomen and pelvis 01/16/2022. FINDINGS: Comment: Today's study is limited for detection and characterization of visceral and/or vascular lesions by lack of IV gadolinium. Lower chest: Unremarkable. Hepatobiliary: No definite suspicious cystic or solid hepatic lesions are confidently identified on today's noncontrast examination. There is diffuse low signal intensity throughout the hepatic parenchyma on in phase dual echo images, as well as on T2 weighted sequences, suggesting substantial hepatic iron deposition. No intrahepatic biliary ductal dilatation. Common bile duct is  mildly dilated measuring 9 mm in the porta hepatis. No filling defect within the common bile duct to suggest choledocholithiasis. There are numerous filling defects within the gallbladder, largest of which appears impacted in the neck of the gallbladder measuring up to 2.8 cm in diameter. Associated with this there is diffuse gallbladder wall thickening and edema, along with a trace amount of pericholecystic fluid. Notably, there is mass-like thickening of the fundus of the gallbladder (axial image 22 of series 4 and coronal image 23 of series 5) which is heterogeneous in signal intensity on T1 and T2 weighted images and demonstrates internal areas of diffusion restriction, concerning for gallbladder neoplasm (poorly evaluated on today's noncontrast examination). Pancreas: No definite pancreatic mass confidently identified on today's noncontrast examination. Diffuse pancreatic ductal dilatation is noted, with the pancreatic duct measuring up to 5 mm in the body of the pancreas. No peripancreatic fluid collections or inflammatory changes. Spleen: Diffuse low signal intensity throughout the splenic parenchyma on in phase dual echo images and T2 weighted sequences, indicative of splenic iron deposition. Adrenals/Urinary Tract: Numerous T1 hypointense, T2 hyperintense lesions are noted in the kidneys bilaterally, incompletely characterized on today's noncontrast examination, but likely to represent cysts, largest of which is exophytic measuring up to 2.6 cm in the anterolateral aspect of the lower pole of the left kidney. No hydroureteronephrosis in the visualized portions of the abdomen. Bilateral adrenal glands are normal in appearance. Stomach/Bowel: Visualized portions are unremarkable. Vascular/Lymphatic: No aneurysm identified in the visualized abdominal vasculature. Borderline enlarged portacaval lymph node measuring 1 cm in short axis (axial image 17 of series 4) and hepatoduodenal ligament lymph node (axial  image 14 of series 4) measuring 1.2 cm in short axis. Other: No significant volume of ascites noted in the visualized portions of the peritoneal cavity.  Small fat containing umbilical hernia. Musculoskeletal: No aggressive appearing osseous lesions are noted in the visualized portions of the skeleton. IMPRESSION: 1. Cholelithiasis with evidence suggestive of acute cholecystitis. 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 Prominent borderline enlarged portacaval and hepatoduodenal ligament lymph nodes are noted. Attention to these at time of cholecystectomy is recommended, as the possibility of metastatic disease is not excluded. 2. Hepatic and splenic hemosiderosis. 3. Dilatation of the common bile duct without intrahepatic biliary ductal dilatation. This could be benign and age-related. No obstructing choledocholithiasis identified on today's examination. Additionally, there is no definite obstructing lesion in the pancreatic head or region of the ampulla. Notably, however, there is also some mild diffuse pancreatic ductal dilatation. If there is clinical concern for subtle neoplasm in the region of the pancreatic head or ampulla, repeat abdominal MRI with and without IV gadolinium with MRCP could be considered. Please note, however, that per report from the technologist, the [name] refused to follow commands and refused to finish today's examination. Electronically Signed   By: Vinnie Langton M.D.   On: 01/17/2022 06:13   DG Chest Port 1 View  Result Date: 01/16/2022 CLINICAL DATA:  Sepsis EXAM: PORTABLE CHEST 1 VIEW COMPARISON:  12/26/2021 FINDINGS: Stable positioning of right IJ central line. Stable cardiomediastinal contours. No focal airspace consolidation, pleural effusion, or pneumothorax. IMPRESSION: No active disease. Electronically Signed   By: Davina Poke D.O.   On: 01/16/2022 17:24    CT Angio Abd/Pel W and/or Wo Contrast  Result Date: 01/16/2022 CLINICAL DATA:  Lower GI bleed. EXAM: CTA ABDOMEN AND PELVIS WITHOUT AND WITH CONTRAST TECHNIQUE: Multidetector CT imaging of the abdomen and pelvis was performed using the standard protocol during bolus administration of intravenous contrast. Multiplanar reconstructed images and MIPs were obtained and reviewed to evaluate the vascular anatomy. RADIATION DOSE REDUCTION: This exam was performed according to the departmental dose-optimization program which includes automated exposure control, adjustment of the mA and/or kV according to [name] size and/or use of iterative reconstruction technique. CONTRAST:  61mL OMNIPAQUE IOHEXOL 350 MG/ML SOLN COMPARISON:  CT abdomen pelvis dated 12/17/2021. FINDINGS: VASCULAR Aorta: Mild atherosclerotic calcification. No aneurysmal dilatation or dissection. The aorta is tortuous. Celiac: Patent without evidence of aneurysm, dissection, vasculitis or significant stenosis. SMA: Patent without evidence of aneurysm, dissection, vasculitis or significant stenosis. Renals: Both renal arteries are patent without evidence of aneurysm, dissection, vasculitis, fibromuscular dysplasia or significant stenosis. IMA: Patent without evidence of aneurysm, dissection, vasculitis or significant stenosis. Inflow: Mild atherosclerotic calcification. No aneurysmal dilatation or dissection. The iliac arteries are patent. Proximal Outflow: Bilateral common femoral and visualized portions of the superficial and profunda femoral arteries are patent without evidence of aneurysm, dissection, vasculitis or significant stenosis. Veins: The IVC is unremarkable.  No portal venous gas. Review of the MIP images confirms the above findings. NON-VASCULAR Lower chest: There is a 9 mm right middle lobe nodule. Follow-up as per recommendation of CT of 12/17/2021. The visualized lung bases are otherwise clear. No intra-abdominal free air or free fluid.  Hepatobiliary: The liver is unremarkable. No intrahepatic biliary dilatation. Several noncalcified gallstones measure up to 2.7 cm. There is a 3.1 x 3.4 cm masslike thickening of the gallbladder fundus. Although this may represent adenomyomatosis, gallbladder malignancy is not excluded. Further characterization with MRI without and with contrast is recommended. There is gallbladder wall edema. Pancreas: Unremarkable. No pancreatic ductal dilatation or surrounding inflammatory changes. Spleen:  Small splenic calcification. Adrenals/Urinary Tract: The adrenal glands unremarkable. There is no hydronephrosis or nephrolithiasis on either side. Focus of cortical calcification in the inferior pole of the right kidney. Several small left renal cysts and additional bilateral hypodense lesions which are too small to characterize. The kidneys are mildly atrophic. The visualized ureters and urinary bladder appear unremarkable. Stomach/Bowel: There is moderate stool throughout the colon. There is no bowel obstruction or active inflammation. The appendix is normal. No evidence of active GI bleed. Lymphatic: No adenopathy. Reproductive: The uterus and ovaries are grossly unremarkable. Other: None Musculoskeletal: Osteopenia with degenerative changes of the spine. There is extensive erosive changes with endplate irregularity, fragmentation, and sclerotic changes and associated soft tissue thickening involving L4-L5 most consistent with spondylo discitis. Further evaluation with MRI without and with contrast is recommended. IMPRESSION: 1. No evidence of active GI bleed. 2. Cholelithiasis with gallbladder wall edema. If there is clinical concern for acute cholecystitis, further evaluation with ultrasound or HIDA scan recommended. 3. Masslike thickening of the gallbladder fundus may represent advanced adenomyomatosis versus neoplasm. Further evaluation with MRI without and with contrast is recommended. 4. Findings of spondylo discitis  at L4-L5. Further evaluation with lumbar spine MRI without and with contrast is recommended. 5. There is a 9 mm right middle lobe nodule. Follow-up as per recommendation of prior CT. 6. Aortic Atherosclerosis (ICD10-I70.0). Electronically Signed   By: Anner Crete M.D.   On: 01/16/2022 20:58

## 2022-01-17 NOTE — Consult Note (Addendum)
[facility] Renal Consultation Note    Indication for Consultation:  Management of ESRD/hemodialysis; anemia, hypertension/volume and secondary hyperparathyroidism   HPI: Diamond [name] is a 71 y.o. female with ESRD on HD TTS who was recently admitted to [facility] 1/11-1/25/23 with sepsis secondary to MRSA bacteremia, L5-S1 discitis, endocarditis on vancomycin until 02/13/22. She was also found to have right atrial thrombus/AFlutter requiring indefinite anticoagulation.  She complained of worsening back/abdominal pain at dialysis yesterday and was sent to the ED for evaluation. Now admitted under observation status. CXR clear. CT angio -thickening of gallbladder wall --->MRCP showed cholelithiasis with mass in the fundus of the gallbladder. General surgery consulted. Labs notable for drop in Hgb 7.9 with +FOBT.  Last dialysis Saturday 2/11. Completed 2h 50 min of treatment. Afebrile. Post HD BP 130/88. Notably, vancomycin trough level 11.9 on 2/7 and Vancomycin increased to 750 mg q HD.  Seen and examined in the ED. She is frustrated from learning about new findings. States has been having back and abdominal pain for "weeks". Feels better today and says she wants to go home.   Past Medical History:  Diagnosis Date   Asthma    Atrial flutter with rapid ventricular response ([place]) 12/25/2021   Chronic kidney disease    dialysis Tues Thurs Sat   Dysrhythmia    PAF 10/2019 in setting of COVID-19 PNA   Gout    Hypertension    Past Surgical History:  Procedure Laterality Date   AV FISTULA PLACEMENT Left 12/23/2021   Procedure: LEFT ARM BRACHIOBASILIC VEIN ARTERIOVENOUS (AV) FISTULA CREATION;  Surgeon: [name], MD;  Location: [place];  Service: Vascular;  Laterality: Left;   May Right 12/23/2021   Procedure: INSERTION OF DIALYSIS CATHETER;  Surgeon: [name], MD;  Location: [place];  Service: Vascular;  Laterality: Right;    TEE WITHOUT CARDIOVERSION N/A 12/22/2021   Procedure: TRANSESOPHAGEAL ECHOCARDIOGRAM (TEE);  Surgeon: Jerline Pain, MD;  Location: [facility] ENDOSCOPY;  Service: Cardiovascular;  Laterality: N/A;   ULTRASOUND GUIDANCE FOR VASCULAR ACCESS  12/23/2021   Procedure: ULTRASOUND GUIDANCE FOR VASCULAR ACCESS;  Surgeon: [name], MD;  Location: [facility] OR;  Service: Vascular;;   Family History  Problem Relation Age of Onset   Hypertension Mother    Hypertension Father    Colon cancer Brother    Lung cancer Brother    Social History:  reports that she has never smoked. She has never used smokeless tobacco. She reports that she does not drink alcohol and does not use drugs. Allergies  Allergen Reactions   Shrimp (Diagnostic) Other (See Comments)    Listed on MAR   Prior to Admission medications   Medication Sig Start Date End Date Taking? Authorizing Provider  acetaminophen (TYLENOL) 500 MG tablet Take 1,000 mg by mouth See admin instructions. 1000 mg twice daily scheduled. May take an additional 1000 mg daily as needed for pain.   Yes Raymondo Band, MD  albuterol (VENTOLIN HFA) 108 (90 Base) MCG/ACT inhaler Inhale 2 puffs into the lungs every 4 (four) hours as needed for wheezing or shortness of breath. 10/08/19  Yes [provider]  allopurinol (ZYLOPRIM) 100 MG tablet Take 200 mg by mouth daily. 12/03/21  Yes [provider]  apixaban (ELIQUIS) 5 MG TABS tablet Take 1 tablet (5 mg total) by mouth 2 (two) times daily. 12/30/21  Yes Ghimire, Henreitta Leber, MD  colchicine 0.6 MG tablet Take  0.6 mg by mouth daily as needed (gout).   Yes [provider]  cyclobenzaprine (FLEXERIL) 5 MG tablet Take 2.5 mg by mouth daily.   Yes [provider]  gabapentin (NEURONTIN) 100 MG capsule Take 100 mg by mouth at bedtime.   Yes [provider]  hydrALAZINE (APRESOLINE) 50 MG tablet Take 1 tablet (50 mg total) by mouth every 8 (eight) hours. 12/30/21  Yes Ghimire, Henreitta Leber, MD  hydrOXYzine (ATARAX) 25 MG tablet Take 12.5 mg by mouth every 6 (six) hours as needed for itching. For 12 days 01/11/22  Yes [provider]  Lidocaine 4 % PTCH Apply 1 patch topically daily.   Yes Jules Husbands A, MD  metoprolol tartrate (LOPRESSOR) 50 MG tablet Take 1 tablet (50 mg total) by mouth 2 (two) times daily. 12/30/21  Yes Ghimire, Henreitta Leber, MD  ondansetron (ZOFRAN) 4 MG tablet Take 4 mg by mouth every 8 (eight) hours as needed. 09/17/21  Yes [provider]  oxyCODONE (OXY IR/ROXICODONE) 5 MG immediate release tablet Take 1 tablet (5 mg total) by mouth every 6 (six) hours as needed for severe pain. 12/30/21  Yes Ghimire, Henreitta Leber, MD  polyethylene glycol (MIRALAX / GLYCOLAX) 17 g packet Take 17 g by mouth daily as needed for mild constipation. 12/30/21  Yes Ghimire, Henreitta Leber, MD  senna-docusate (SENOKOT-S) 8.6-50 MG tablet Take 2 tablets by mouth 2 (two) times daily. 12/30/21  Yes Ghimire, Henreitta Leber, MD  sevelamer carbonate (RENVELA) 800 MG tablet Take 2 tablets (1,600 mg total) by mouth 3 (three) times daily with meals. Patient taking differently: Take 800-2,400 mg by mouth See admin instructions. 2400 mg by mouth three times daily and an additional 800 mg by mouth with a snack 12/30/21  Yes Ghimire, Henreitta Leber, MD  Skin Protectants, Misc. (EUCERIN) cream Apply 1 application topically daily.   Yes Jules Husbands A, MD  vancomycin (VANCOREADY) 500 MG/100ML IVPB Inject 100 mLs (500 mg total) into the vein Every Tuesday,Thursday,and Saturday with dialysis. Patient not taking: Reported on 01/17/2022 12/31/21 02/13/22  Jonetta Osgood, MD   Current Facility-Administered Medications  Medication Dose Route Frequency Provider Last Rate Last Admin   0.9 %  sodium chloride infusion (Manually program via Guardrails IV Fluids)   Intravenous Once Thurnell Lose, MD       acetaminophen (TYLENOL) tablet 650 mg  650 mg Oral Q6H PRN Shalhoub, Sherryll Burger, MD       Or    acetaminophen (TYLENOL) suppository 650 mg  650 mg Rectal Q6H PRN Shalhoub, Sherryll Burger, MD       albuterol (PROVENTIL) (2.5 MG/3ML) 0.083% nebulizer solution 3 mL  3 mL Inhalation Q4H PRN Shalhoub, Sherryll Burger, MD       allopurinol (ZYLOPRIM) tablet 200 mg  200 mg Oral Daily Shalhoub, Sherryll Burger, MD   200 mg at 01/17/22 0918   amLODipine (NORVASC) tablet 10 mg  10 mg Oral Daily Thurnell Lose, MD   10 mg at 01/17/22 1044   cefTRIAXone (ROCEPHIN) 2 g in sodium chloride 0.9 % 100 mL IVPB  2 g Intravenous Q24H Thurnell Lose, MD   Stopped at 01/17/22 1146   cyclobenzaprine (FLEXERIL) tablet 2.5 mg  2.5 mg Oral Daily Shalhoub, Sherryll Burger, MD   2.5 mg at 01/17/22 9024   gabapentin (NEURONTIN) capsule 100 mg  100 mg Oral QHS Vernelle Emerald, MD   100 mg at 01/17/22 0149   hydrALAZINE (APRESOLINE) injection 10 mg  10 mg Intravenous Q6H PRN Vernelle Emerald, MD   10 mg at 01/17/22 1308   metoprolol tartrate (LOPRESSOR) tablet 100 mg  100 mg Oral BID Thurnell Lose, MD       metroNIDAZOLE (FLAGYL) tablet 500 mg  500 mg Oral Q12H Thurnell Lose, MD   500 mg at 01/17/22 1307   ondansetron (ZOFRAN) tablet 4 mg  4 mg Oral Q6H PRN Shalhoub, Sherryll Burger, MD       Or   ondansetron [facility]) injection 4 mg  4 mg Intravenous Q6H PRN Shalhoub, Sherryll Burger, MD       pantoprazole (PROTONIX) injection 40 mg  40 mg Intravenous Q12H Thurnell Lose, MD   40 mg at 01/17/22 1045   polyethylene glycol (MIRALAX / GLYCOLAX) packet 17 g  17 g Oral Daily PRN Shalhoub, Sherryll Burger, MD       senna-docusate (Senokot-S) tablet 2 tablet  2 tablet Oral BID Vernelle Emerald, MD   2 tablet at 01/17/22 1017   sevelamer carbonate (RENVELA) tablet 1,600 mg  1,600 mg Oral TID WC Vernelle Emerald, MD   1,600 mg at 01/17/22 1307   [START ON 01/19/2022] vancomycin (VANCOREADY) IVPB 500 mg/100 mL  500 mg Intravenous Q T,Th,Sa-HD Shalhoub, Sherryll Burger, MD       Current Outpatient Medications  Medication Sig Dispense Refill   acetaminophen  (TYLENOL) 500 MG tablet Take 1,000 mg by mouth See admin instructions. 1000 mg twice daily scheduled. May take an additional 1000 mg daily as needed for pain.     albuterol (VENTOLIN HFA) 108 (90 Base) MCG/ACT inhaler Inhale 2 puffs into the lungs every 4 (four) hours as needed for wheezing or shortness of breath.     allopurinol (ZYLOPRIM) 100 MG tablet Take 200 mg by mouth daily.     apixaban (ELIQUIS) 5 MG TABS tablet Take 1 tablet (5 mg total) by mouth 2 (two) times daily. 60 tablet    colchicine 0.6 MG tablet Take 0.6 mg by mouth daily as needed (gout).     cyclobenzaprine (FLEXERIL) 5 MG tablet Take 2.5 mg by mouth daily.     gabapentin (NEURONTIN) 100 MG capsule Take 100 mg by mouth at bedtime.     hydrALAZINE (APRESOLINE) 50 MG tablet Take 1 tablet (50 mg total) by mouth every 8 (eight) hours.     hydrOXYzine (ATARAX) 25 MG tablet Take 12.5 mg by mouth every 6 (six) hours as needed for itching. For 12 days     Lidocaine 4 % PTCH Apply 1 patch topically daily.     metoprolol tartrate (LOPRESSOR) 50 MG tablet Take 1 tablet (50 mg total) by mouth 2 (two) times daily.     ondansetron (ZOFRAN) 4 MG tablet Take 4 mg by mouth every 8 (eight) hours as needed.     oxyCODONE (OXY IR/ROXICODONE) 5 MG immediate release tablet Take 1 tablet (5 mg total) by mouth every 6 (six) hours as needed for severe pain. 10 tablet 0   polyethylene glycol (MIRALAX / GLYCOLAX) 17 g packet Take 17 g by mouth daily as needed for mild constipation. 14 each 0   senna-docusate (SENOKOT-S) 8.6-50 MG tablet Take 2 tablets by mouth 2 (two) times daily.     sevelamer carbonate (RENVELA) 800 MG tablet Take 2 tablets (1,600 mg total) by mouth 3 (three) times daily with meals. (Patient taking differently: Take 800-2,400 mg by mouth See admin instructions. 2400 mg by mouth three times daily and an  additional 800 mg by mouth with a snack)     Skin Protectants, Misc. (EUCERIN) cream Apply 1 application topically daily.     vancomycin  (VANCOREADY) 500 MG/100ML IVPB Inject 100 mLs (500 mg total) into the vein Every Tuesday,Thursday,and Saturday with dialysis. (Patient not taking: Reported on 01/17/2022) 4000 mL      ROS: As per HPI otherwise negative.  Physical Exam: Vitals:   01/17/22 0718 01/17/22 0927 01/17/22 1155 01/17/22 1245  BP:  (!) 177/96 (!) 176/114 (!) 201/106  Pulse:  91 82 84  Resp:  (!) 22 (!) 26 18  Temp: 99.2 F (37.3 C)     TempSrc: Oral     SpO2:  99% 97% 100%  Weight:      Height:         General: Appears comfortable, in no distress  Head: NCAT sclera not icteric MMM Neck: Supple. No JVD appreciated  Lungs: Clear bilaterally without wheezes, rales, or rhonchi.  Heart: RRR, no murmur, rub, or gallop  Abdomen: soft non-tender, bowel sounds normal, no masses  Lower extremities:without edema or ischemic changes, no open wounds  Neuro: A & O X 3. Moves all extremities spontaneously. Psych:  Responds to questions appropriately with a normal affect. Dialysis Access: R IJ TDC; LUE +bruit (maturing)   Labs: Basic Metabolic Panel: Recent Labs  Lab 01/16/22 1550 01/17/22 0426  NA 137 136  K 3.1* 3.2*  CL 95* 96*  CO2 30 30  GLUCOSE 84 88  BUN 9 12  CREATININE 2.26* 3.33*  CALCIUM 8.5* 8.8*   Liver Function Tests: Recent Labs  Lab 01/16/22 1550 01/17/22 0426  AST 17 13*  ALT 11 9  ALKPHOS 114 100  BILITOT 0.4 0.5  PROT 7.0 5.8*  ALBUMIN 2.3* 2.0*   Recent Labs  Lab 01/16/22 1550  LIPASE 42   No results for input(s): AMMONIA in the last 168 hours. CBC: Recent Labs  Lab 01/16/22 1550 01/17/22 0426 01/17/22 0631  WBC 4.3 4.5 4.1  NEUTROABS 2.9  --   --   HGB 8.1* 7.0* 7.9*  HCT 26.6* 21.7* 25.2*  MCV 98.5 96.4 96.6  PLT 249 216 234   235   Cardiac Enzymes: No results for input(s): CKTOTAL, CKMB, CKMBINDEX, TROPONINI in the last 168 hours. CBG: Recent Labs  Lab 01/17/22 0045  GLUCAP 94   Iron Studies:  Recent Labs    01/17/22 0629  IRON 38  TIBC 154*   FERRITIN 1,471*   Studies/Results: MR ABDOMEN MRCP WO CONTRAST  Result Date: 01/17/2022 CLINICAL DATA:  71 year old female with history of abdominal pain and back pain. Possible cholecystitis noted on recent CT examination, in addition to mass-like thickening of the gallbladder fundus. Further evaluation. EXAM: MRI ABDOMEN WITHOUT CONTRAST  (INCLUDING MRCP) TECHNIQUE: Multiplanar multisequence MR imaging of the abdomen was performed. Heavily T2-weighted images of the biliary and pancreatic ducts were obtained, and three-dimensional MRCP images were rendered by post processing. COMPARISON:  No prior abdominal MRI. CTA of the abdomen and pelvis 01/16/2022. FINDINGS: Comment: Today's study is limited for detection and characterization of visceral and/or vascular lesions by lack of IV gadolinium. Lower chest: Unremarkable. Hepatobiliary: No definite suspicious cystic or solid hepatic lesions are confidently identified on today's noncontrast examination. There is diffuse low signal intensity throughout the hepatic parenchyma on in phase dual echo images, as well as on T2 weighted sequences, suggesting substantial hepatic iron deposition. No intrahepatic biliary ductal dilatation. Common bile duct is mildly dilated measuring 9 mm  in the porta hepatis. No filling defect within the common bile duct to suggest choledocholithiasis. There are numerous filling defects within the gallbladder, largest of which appears impacted in the neck of the gallbladder measuring up to 2.8 cm in diameter. Associated with this there is diffuse gallbladder wall thickening and edema, along with a trace amount of pericholecystic fluid. Notably, there is mass-like thickening of the fundus of the gallbladder (axial image 22 of series 4 and coronal image 23 of series 5) which is heterogeneous in signal intensity on T1 and T2 weighted images and demonstrates internal areas of diffusion restriction, concerning for gallbladder neoplasm (poorly  evaluated on today's noncontrast examination). Pancreas: No definite pancreatic mass confidently identified on today's noncontrast examination. Diffuse pancreatic ductal dilatation is noted, with the pancreatic duct measuring up to 5 mm in the body of the pancreas. No peripancreatic fluid collections or inflammatory changes. Spleen: Diffuse low signal intensity throughout the splenic parenchyma on in phase dual echo images and T2 weighted sequences, indicative of splenic iron deposition. Adrenals/Urinary Tract: Numerous T1 hypointense, T2 hyperintense lesions are noted in the kidneys bilaterally, incompletely characterized on today's noncontrast examination, but likely to represent cysts, largest of which is exophytic measuring up to 2.6 cm in the anterolateral aspect of the lower pole of the left kidney. No hydroureteronephrosis in the visualized portions of the abdomen. Bilateral adrenal glands are normal in appearance. Stomach/Bowel: Visualized portions are unremarkable. Vascular/Lymphatic: No aneurysm identified in the visualized abdominal vasculature. Borderline enlarged portacaval lymph node measuring 1 cm in short axis (axial image 17 of series 4) and hepatoduodenal ligament lymph node (axial image 14 of series 4) measuring 1.2 cm in short axis. Other: No significant volume of ascites noted in the visualized portions of the peritoneal cavity. Small fat containing umbilical hernia. Musculoskeletal: No aggressive appearing osseous lesions are noted in the visualized portions of the skeleton. IMPRESSION: 1. Cholelithiasis with evidence suggestive of acute cholecystitis. 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 Prominent borderline enlarged portacaval and hepatoduodenal ligament lymph nodes are noted. Attention to these at time of cholecystectomy is recommended, as the possibility of metastatic disease is  not excluded. 2. Hepatic and splenic hemosiderosis. 3. Dilatation of the common bile duct without intrahepatic biliary ductal dilatation. This could be benign and age-related. No obstructing choledocholithiasis identified on today's examination. Additionally, there is no definite obstructing lesion in the pancreatic head or region of the ampulla. Notably, however, there is also some mild diffuse pancreatic ductal dilatation. If there is clinical concern for subtle neoplasm in the region of the pancreatic head or ampulla, repeat abdominal MRI with and without IV gadolinium with MRCP could be considered. Please note, however, that per report from the technologist, the patient refused to follow commands and refused to finish today's examination. Electronically Signed   By: Vinnie Langton M.D.   On: 01/17/2022 06:13   DG Chest Port 1 View  Result Date: 01/16/2022 CLINICAL DATA:  Sepsis EXAM: PORTABLE CHEST 1 VIEW COMPARISON:  12/26/2021 FINDINGS: Stable positioning of right IJ central line. Stable cardiomediastinal contours. No focal airspace consolidation, pleural effusion, or pneumothorax. IMPRESSION: No active disease. Electronically Signed   By: Davina Poke D.O.   On: 01/16/2022 17:24   CT Angio Abd/Pel W and/or Wo Contrast  Result Date: 01/16/2022 CLINICAL DATA:  Lower GI bleed. EXAM: CTA ABDOMEN AND PELVIS WITHOUT AND WITH CONTRAST TECHNIQUE: Multidetector CT imaging of the  abdomen and pelvis was performed using the standard protocol during bolus administration of intravenous contrast. Multiplanar reconstructed images and MIPs were obtained and reviewed to evaluate the vascular anatomy. RADIATION DOSE REDUCTION: This exam was performed according to the departmental dose-optimization program which includes automated exposure control, adjustment of the mA and/or kV according to patient size and/or use of iterative reconstruction technique. CONTRAST:  84mL OMNIPAQUE IOHEXOL 350 MG/ML SOLN COMPARISON:   CT abdomen pelvis dated 12/17/2021. FINDINGS: VASCULAR Aorta: Mild atherosclerotic calcification. No aneurysmal dilatation or dissection. The aorta is tortuous. Celiac: Patent without evidence of aneurysm, dissection, vasculitis or significant stenosis. SMA: Patent without evidence of aneurysm, dissection, vasculitis or significant stenosis. Renals: Both renal arteries are patent without evidence of aneurysm, dissection, vasculitis, fibromuscular dysplasia or significant stenosis. IMA: Patent without evidence of aneurysm, dissection, vasculitis or significant stenosis. Inflow: Mild atherosclerotic calcification. No aneurysmal dilatation or dissection. The iliac arteries are patent. Proximal Outflow: Bilateral common femoral and visualized portions of the superficial and profunda femoral arteries are patent without evidence of aneurysm, dissection, vasculitis or significant stenosis. Veins: The IVC is unremarkable.  No portal venous gas. Review of the MIP images confirms the above findings. NON-VASCULAR Lower chest: There is a 9 mm right middle lobe nodule. Follow-up as per recommendation of CT of 12/17/2021. The visualized lung bases are otherwise clear. No intra-abdominal free air or free fluid. Hepatobiliary: The liver is unremarkable. No intrahepatic biliary dilatation. Several noncalcified gallstones measure up to 2.7 cm. There is a 3.1 x 3.4 cm masslike thickening of the gallbladder fundus. Although this may represent adenomyomatosis, gallbladder malignancy is not excluded. Further characterization with MRI without and with contrast is recommended. There is gallbladder wall edema. Pancreas: Unremarkable. No pancreatic ductal dilatation or surrounding inflammatory changes. Spleen: Small splenic calcification. Adrenals/Urinary Tract: The adrenal glands unremarkable. There is no hydronephrosis or nephrolithiasis on either side. Focus of cortical calcification in the inferior pole of the right kidney. Several small  left renal cysts and additional bilateral hypodense lesions which are too small to characterize. The kidneys are mildly atrophic. The visualized ureters and urinary bladder appear unremarkable. Stomach/Bowel: There is moderate stool throughout the colon. There is no bowel obstruction or active inflammation. The appendix is normal. No evidence of active GI bleed. Lymphatic: No adenopathy. Reproductive: The uterus and ovaries are grossly unremarkable. Other: None Musculoskeletal: Osteopenia with degenerative changes of the spine. There is extensive erosive changes with endplate irregularity, fragmentation, and sclerotic changes and associated soft tissue thickening involving L4-L5 most consistent with spondylo discitis. Further evaluation with MRI without and with contrast is recommended. IMPRESSION: 1. No evidence of active GI bleed. 2. Cholelithiasis with gallbladder wall edema. If there is clinical concern for acute cholecystitis, further evaluation with ultrasound or HIDA scan recommended. 3. Masslike thickening of the gallbladder fundus may represent advanced adenomyomatosis versus neoplasm. Further evaluation with MRI without and with contrast is recommended. 4. 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 Further evaluation with lumbar spine MRI without and with contrast is recommended. 5. There is a 9 mm right middle lobe nodule. Follow-up as per recommendation of prior CT. 6. Aortic Atherosclerosis (ICD10-I70.0). Electronically Signed   By: Anner Crete M.D.   On: 01/16/2022 20:58    Dialysis Orders:  Unit: [facility] TTS  Time: 4:00  EDW: 62 kg  Flows: 400  Bath: 2K/2Ca  Access: TDC/Maturing AVF  Heparin: Bolus 2000  ESA: Mircera 50  q 2 wks (last 01/07/22) VDRA: Hectorol 3 TIW   Assessment/Plan: ESRD -  HD TTS. No urgent dialysis needs. Continue on schedule. Next HD 2/14.  Abd pain --Cholelithiasis/Gallbladder mass on MRCP. On IV Rocephin/Flagyl. Gen surgery consulted.  Hypertension/volume  -  Elevated readings on admission but with leg cuff. BP well controlled as outpatient. No volume excess on exam.  Anemia  - CKD +/- GIB. Hgb 7.9 GI following.  Transfuse prn. Next ESA due 2/16.  Metabolic bone disease -  Ca in range. Continue Hectorol/binders  MRSA bacteremia/discitis/endocarditis - prior to admission. On vancomycin q HD until 02/13/22. Subtherapeutic vanc level at outpatient dialysis on 2/7 --Increased Vanc to 750mg  q HD.  Atrial flutter - on Eliquis Nutrition - CL diet currently   Lynnda Child PA-C [facility] 01/17/2022, 1:16 PM

## 2022-01-17 NOTE — ED Notes (Signed)
Pt son is asking for update. MD notified to call son.

## 2022-01-17 NOTE — Assessment & Plan Note (Signed)
·   Currently rate controlled  As mentioned above, resuming previously prescribed (but not taken) regimen of Eliquis to ensure the patient can safely tolerate this without significant clinical bleeding.  Continue home regimen of metoprolol  Monitoring on telemetry

## 2022-01-18 ENCOUNTER — Inpatient Hospital Stay (HOSPITAL_COMMUNITY): Payer: Medicare HMO

## 2022-01-18 ENCOUNTER — Encounter (HOSPITAL_COMMUNITY): Payer: Self-pay | Admitting: Certified Registered Nurse Anesthetist

## 2022-01-18 DIAGNOSIS — R1084 Generalized abdominal pain: Secondary | ICD-10-CM

## 2022-01-18 DIAGNOSIS — K828 Other specified diseases of gallbladder: Secondary | ICD-10-CM

## 2022-01-18 LAB — COMPREHENSIVE METABOLIC PANEL
ALT: 9 U/L (ref 0–44)
AST: 15 U/L (ref 15–41)
Albumin: 2 g/dL — ABNORMAL LOW (ref 3.5–5.0)
Alkaline Phosphatase: 92 U/L (ref 38–126)
Anion gap: 11 (ref 5–15)
BUN: 20 mg/dL (ref 8–23)
CO2: 28 mmol/L (ref 22–32)
Calcium: 10 mg/dL (ref 8.9–10.3)
Chloride: 96 mmol/L — ABNORMAL LOW (ref 98–111)
Creatinine, Ser: 4.63 mg/dL — ABNORMAL HIGH (ref 0.44–1.00)
GFR, Estimated: 10 mL/min — ABNORMAL LOW (ref >60)
Glucose, Bld: 91 mg/dL (ref 70–99)
Potassium: 4.4 mmol/L (ref 3.5–5.1)
Sodium: 135 mmol/L (ref 135–145)
Total Bilirubin: 0.5 mg/dL (ref 0.3–1.2)
Total Protein: 6.3 g/dL — ABNORMAL LOW (ref 6.5–8.1)

## 2022-01-18 LAB — CBC WITH DIFFERENTIAL/PLATELET
Abs Immature Granulocytes: 0.02 10*3/uL (ref 0.00–0.07)
Basophils Absolute: 0 10*3/uL (ref 0.0–0.1)
Basophils Relative: 1 %
Eosinophils Absolute: 0.2 10*3/uL (ref 0.0–0.5)
Eosinophils Relative: 5 %
HCT: 23.6 % — ABNORMAL LOW (ref 36.0–46.0)
Hemoglobin: 7.6 g/dL — ABNORMAL LOW (ref 12.0–15.0)
Immature Granulocytes: 1 %
Lymphocytes Relative: 23 %
Lymphs Abs: 1 10*3/uL (ref 0.7–4.0)
MCH: 30.8 pg (ref 26.0–34.0)
MCHC: 32.2 g/dL (ref 30.0–36.0)
MCV: 95.5 fL (ref 80.0–100.0)
Monocytes Absolute: 0.5 10*3/uL (ref 0.1–1.0)
Monocytes Relative: 11 %
Neutro Abs: 2.5 10*3/uL (ref 1.7–7.7)
Neutrophils Relative %: 59 %
Platelets: 234 10*3/uL (ref 150–400)
RBC: 2.47 MIL/uL — ABNORMAL LOW (ref 3.87–5.11)
RDW: 15.6 % — ABNORMAL HIGH (ref 11.5–15.5)
WBC: 4.3 10*3/uL (ref 4.0–10.5)
nRBC: 0 % (ref 0.0–0.2)

## 2022-01-18 LAB — C-REACTIVE PROTEIN: CRP: 12.5 mg/dL — ABNORMAL HIGH (ref <1.0)

## 2022-01-18 LAB — BRAIN NATRIURETIC PEPTIDE: B Natriuretic Peptide: 1628.1 pg/mL — ABNORMAL HIGH (ref 0.0–100.0)

## 2022-01-18 LAB — MAGNESIUM: Magnesium: 2 mg/dL (ref 1.7–2.4)

## 2022-01-18 MED ORDER — TECHNETIUM TC 99M MEBROFENIN IV KIT
5.4000 | PACK | Freq: Once | INTRAVENOUS | Status: AC | PRN
  Administered 2022-01-18: 5.4 via INTRAVENOUS

## 2022-01-18 MED ORDER — BISACODYL 5 MG PO TBEC
10.0000 mg | DELAYED_RELEASE_TABLET | Freq: Two times a day (BID) | ORAL | Status: DC
  Administered 2022-01-19: 10 mg via ORAL
  Filled 2022-01-18: qty 2

## 2022-01-18 MED ORDER — PEG-KCL-NACL-NASULF-NA ASC-C 100 G PO SOLR
0.5000 | Freq: Once | ORAL | Status: AC
Start: 2022-01-19 — End: 2022-01-19
  Administered 2022-01-19: 100 g via ORAL

## 2022-01-18 MED ORDER — BISACODYL 5 MG PO TBEC
10.0000 mg | DELAYED_RELEASE_TABLET | Freq: Two times a day (BID) | ORAL | Status: AC
Start: 2022-01-18 — End: 2022-01-19
  Administered 2022-01-18: 10 mg via ORAL
  Filled 2022-01-18 (×2): qty 2

## 2022-01-18 MED ORDER — CHLORHEXIDINE GLUCONATE CLOTH 2 % EX PADS
6.0000 | MEDICATED_PAD | Freq: Every day | CUTANEOUS | Status: DC
  Administered 2022-01-19 – 2022-01-22 (×4): 6 via TOPICAL

## 2022-01-18 MED ORDER — PEG-KCL-NACL-NASULF-NA ASC-C 100 G PO SOLR
0.5000 | Freq: Once | ORAL | Status: AC
Start: 2022-01-18 — End: 2022-01-18
  Administered 2022-01-18: 100 g via ORAL
  Filled 2022-01-18: qty 1

## 2022-01-18 MED ORDER — HYDRALAZINE HCL 50 MG PO TABS
50.0000 mg | ORAL_TABLET | Freq: Three times a day (TID) | ORAL | Status: DC
  Administered 2022-01-18 – 2022-01-22 (×12): 50 mg via ORAL
  Filled 2022-01-18 (×12): qty 1

## 2022-01-18 MED ORDER — PEG-KCL-NACL-NASULF-NA ASC-C 100 G PO SOLR: 1.0000 | Freq: Once | ORAL | Status: DC

## 2022-01-18 MED ORDER — WHITE PETROLATUM EX OINT
TOPICAL_OINTMENT | CUTANEOUS | Status: DC | PRN
  Filled 2022-01-18: qty 28.35

## 2022-01-18 NOTE — Evaluation (Signed)
Occupational Therapy Evaluation Patient Details Name: Diamond [name] MRN: [id number] DOB: [birth year] Today's Date: 01/18/2022   History of Present Illness Pt is a 71 y/o female who presented with abdominal pain. Pt admitted for acute on chronic anemia and possible cholecystitis vs gallbladder mass. CT negative for active GI bleed. Pending EGD/colonostomy 2/13. 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 PMH: ESRD on HD, asthma, HTN, anemia, a fib.   Clinical Impression   Pt with recent DC to SNF rehab (though unable to recall this when asked) and reports previously Independent in all ADLs, IADLs and mobility. Pt presents with diagnoses above and deficits in cognition, strength, balance, endurance and pain. With encouragement and increased time, pt able to complete bed mobility at [place] and partial sit to stands at [place] [facility] (declined to fully attempt standing this AM). Pt requires Supervision for UB ADLs and up to [place] A for LB ADLs. Pt endorses feeling weak and feels unable to mobilize to bathroom without significant assist at this time. Pt also with observable memory deficits that poses safety concerns if pt were to DC home alone. Based on current presentation, recommend SNF rehab at DC.      Recommendations for follow up therapy are one component of a multi-disciplinary discharge planning process, led by the attending physician.  Recommendations may be updated based on patient status, additional functional criteria and insurance authorization.   Follow Up Recommendations  Skilled nursing-short term rehab (<3 hours/day)    Assistance Recommended at Discharge Frequent or constant Supervision/Assistance  Patient can return home with the following A lot of help with bathing/dressing/bathroom;Assistance with cooking/housework;Direct supervision/assist for medications management;Direct supervision/assist for financial  management;Assist for transportation;Help with stairs or ramp for entrance    Functional Status Assessment  Patient has had a recent decline in their functional status and demonstrates the ability to make significant improvements in function in a reasonable and predictable amount of time.  Equipment Recommendations  BSC/3in1    Recommendations for Other Services       Precautions / Restrictions Precautions Precautions: Fall Restrictions Weight Bearing Restrictions: No      Mobility Bed Mobility Overal bed mobility: Needs Assistance Bed Mobility: Rolling, Sidelying to Sit, Sit to Sidelying Rolling: Min assist Sidelying to sit: Min assist     Sit to sidelying: Min assist General bed mobility comments: Encouraged log rolling to minimize back pain with Min A for fully rolling to side, able to bring B LE off of bed withotu assist and handheld assist to advance trunk. able to use bedrails to guide trunk back to sidelying with light assist for BLE    Transfers Overall transfer level: Needs assistance Equipment used: 1 person hand held assist               General transfer comment: Pt hesistant for standing, so encouraged scooting along bedside with pt unable to successfully sequence moving hips laterally though able to clear bottom fairly well. Min A for partial stand and guiding hips higher in bed      Balance Overall balance assessment: Needs assistance Sitting-balance support: Feet supported Sitting balance-Leahy Scale: Good Sitting balance - Comments: able to sit EOB without support or reports of pain. Able to demo various stretching to decrease back stiffness without LOB  ADL either performed or assessed with clinical judgement   ADL Overall ADL's : Needs assistance/impaired Eating/Feeding: Independent;Sitting   Grooming: Set up;Sitting;Wash/dry face   Upper Body Bathing: Sitting;Supervision/ safety   Lower Body  Bathing: Moderate assistance;Sit to/from stand   Upper Body Dressing : Sitting;Supervision/safety   Lower Body Dressing: Maximal assistance;Sitting/lateral leans;Sit to/from stand Lower Body Dressing Details (indicate cue type and reason): to don socks (limited by back pain in bending)     Toileting- Clothing Manipulation and Hygiene: Maximal assistance;Sitting/lateral lean;Sit to/from stand Toileting - Clothing Manipulation Details (indicate cue type and reason): used purewick in between bed mobility progression       General ADL Comments: Limited by cognition, reports of intermittent back pain with movement and endorses weakness. Pt reports feeling too weak to toilet self on own but receptive to education on progressing BSC transfers and then building on to mobility     Vision Baseline Vision/History: 1 Wears glasses Ability to See in Adequate Light: 1 Impaired Patient Visual Report: No change from baseline Vision Assessment?: No apparent visual deficits     Perception     Praxis      Pertinent Vitals/Pain Pain Assessment Pain Assessment: Faces Faces Pain Scale: Hurts a little bit Pain Location: low back Pain Descriptors / Indicators: Sore (stiff) Pain Intervention(s): Monitored during session, Limited activity within patient's tolerance, Repositioned     Hand Dominance Right   Extremity/Trunk Assessment Upper Extremity Assessment Upper Extremity Assessment: Generalized weakness   Lower Extremity Assessment Lower Extremity Assessment: Defer to PT evaluation   Cervical / Trunk Assessment Cervical / Trunk Assessment: Normal   Communication     Cognition Arousal/Alertness: Awake/alert Behavior During Therapy: Flat affect Overall Cognitive Status: Impaired/Different from baseline Area of Impairment: Problem solving, Awareness, Safety/judgement, Following commands, Memory, Attention, Orientation                 Orientation Level: Disoriented to, Time Current  Attention Level: Sustained Memory: Decreased short-term memory Following Commands: Follows one step commands consistently, Follows one step commands with increased time Safety/Judgement: Decreased awareness of safety, Decreased awareness of deficits Awareness: Intellectual Problem Solving: Slow processing, Decreased initiation, Requires verbal cues, Requires tactile cues General Comments: benefits from increased time, multimodal cues. Noted pt DC to SNF rehab in Jan though when asked, pt appears unaware of this and reports only going to dialysis center. some poor insight into current deficits, how to progress strength, etc. asking repetitive questions about food/meals (though NPO for procedure and had been educated on this). intermittent confusion with inappropriate responses (ex. pt began to remove gown prior to attempting to sit EOB though reoriented that this was not needed to complete task)     General Comments       Exercises     Shoulder Instructions      Home Living Family/patient expects to be discharged to:: Private residence Living Arrangements: Alone Available Help at Discharge: Family;Available PRN/intermittently Type of Home: House Home Access: Level entry     Home Layout: One level     Bathroom Shower/Tub: Occupational psychologist: Standard     Home Equipment: Conservation officer, nature (2 wheels);Wheelchair - manual          Prior Functioning/Environment Prior Level of Function : Independent/Modified Independent             Mobility Comments: uses RW on days when she feels like she needs it but typically no AD. unable to recall SNF rehab stay ADLs Comments: reports  previously being very independent (driving, IADLs, going to church, etc). Recent DC to SNF rehab after Jan 2023 admission per chart but unable to recall this        OT Problem List: Decreased strength;Impaired balance (sitting and/or standing);Decreased activity tolerance;Decreased safety  awareness;Decreased knowledge of use of DME or AE;Pain;Decreased cognition      OT Treatment/Interventions: Self-care/ADL training;Therapeutic exercise;Energy conservation;DME and/or AE instruction;Therapeutic activities;Patient/family education;Balance training    OT Goals(Current goals can be found in the care plan section) Acute Rehab OT Goals Patient Stated Goal: figure out what is causing medical issues, resolve pain OT Goal Formulation: With patient Time For Goal Achievement: 02/01/22 Potential to Achieve Goals: Good ADL Goals Pt Will Perform Grooming: with min guard assist;standing Pt Will Perform Lower Body Bathing: with min guard assist;sitting/lateral leans;sit to/from stand Pt Will Transfer to Toilet: with min guard assist;stand pivot transfer;bedside commode Additional ADL Goal #1: Pt to complete formal cognitive assessment  OT Frequency: Min 2X/week    Co-evaluation              AM-PAC OT "6 Clicks" Daily Activity     Outcome Measure Help from another person eating meals?: None Help from another person taking care of personal grooming?: A Little Help from another person toileting, which includes using toliet, bedpan, or urinal?: A Lot Help from another person bathing (including washing, rinsing, drying)?: A Lot Help from another person to put on and taking off regular upper body clothing?: A Little Help from another person to put on and taking off regular lower body clothing?: A Lot 6 Click Score: 16   End of Session    Activity Tolerance: Patient tolerated treatment well Patient left: in bed;with call bell/phone within reach;with bed alarm set  OT Visit Diagnosis: Unsteadiness on feet (R26.81);Other abnormalities of gait and mobility (R26.89);Muscle weakness (generalized) (M62.81);Pain Pain - part of body:  (back)                Time: 3491-7915 OT Time Calculation (min): 35 min Charges:  OT General Charges $OT Visit: 1 Visit OT Evaluation $OT Eval Moderate  Complexity: 1 Mod OT Treatments $Therapeutic Activity: 8-22 mins  Malachy Chamber, OTR/L Acute Rehab Services Office: [phone]   Layla Maw 01/18/2022, 8:25 AM

## 2022-01-18 NOTE — Evaluation (Signed)
Physical Therapy Evaluation Patient Details Name: Diamond [name] MRN: [id number] DOB: 10/17/1951 Today's Date: 01/18/2022  History of Present Illness  Pt is a 71 y/o female admitted from [facility] 01/16/22 with abdominal pain. Workup for acute on chronic anemia, possible cholecystitis vs gallbladder mass. CT negative for active GI bleed. Pending EGD/colonostomy 2/13. 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 PMH includes ESRD on HD, asthma, HTN, anemia, a fib.   Clinical Impression  Pt presents with an overall decrease in functional mobility secondary to above. PTA, pt working with therapies at SNF; pt inconsistent historian, but reports 1-2 people assist for OOB mobility. Today, pt performing bed mobility with minA, but reports back is limiting LE movement (unable to expand on this) and cannot sit up due to fatigue ("I'm too tired for this exercise"). Pt endorses goals to regain indep, but does not equate this with OOB mobility despite education. Pt would benefit from continued acute PT services to maximize functional mobility and independence prior to d/c with continued SNF-level therapies.     Recommendations for follow up therapy are one component of a multi-disciplinary discharge planning process, led by the attending physician.  Recommendations may be updated based on patient status, additional functional criteria and insurance authorization.  Follow Up Recommendations Skilled nursing-short term rehab (<3 hours/day)    Assistance Recommended at Discharge Frequent or constant Supervision/Assistance  Patient can return home with the following  A lot of help with walking and/or transfers;A lot of help with bathing/dressing/bathroom;Assistance with cooking/housework;Assist for transportation;Help with stairs or ramp for entrance;Direct supervision/assist for financial management;Direct supervision/assist  for medications management    Equipment Recommendations BSC/3in1;Wheelchair (measurements PT);Wheelchair cushion (measurements PT);Hospital bed (TBD)  Recommendations for Other Services       Functional Status Assessment Patient has had a recent decline in their functional status and demonstrates the ability to make significant improvements in function in a reasonable and predictable amount of time.     Precautions / Restrictions Precautions Precautions: Fall Restrictions Weight Bearing Restrictions: No      Mobility  Bed Mobility Overal bed mobility: Needs Assistance Bed Mobility: Rolling           General bed mobility comments: pt performing partial rolls and repositioning hips with use of bed rails, minA for BLE management; pt adamantly declining sitting EOB    Transfers                        Ambulation/Gait                  Stairs            Wheelchair Mobility    Modified Rankin (Stroke Patients Only)       Balance                                             Pertinent Vitals/Pain Pain Assessment Pain Assessment: Faces Faces Pain Scale: Hurts a little bit Pain Location: low back Pain Descriptors / Indicators: Discomfort Pain Intervention(s): Monitored during session, Limited activity within patient's tolerance    Home Living Family/patient expects to be discharged to:: Skilled nursing facility Living Arrangements: Alone Available Help at Discharge: Family;Available PRN/intermittently Type of Home: House Home Access: Level entry       Home Layout:  One level Home Equipment: Conservation officer, nature (2 wheels);Wheelchair - manual Additional Comments: Pt reports "I'm living at the rehab place for now" but endorses goals to return hom    Prior Function Prior Level of Function : Needs assist;Patient poor historian/Family not available             Mobility Comments: inconsistent historian; recalls living at rehab  facility (unsure of name), working with therapies, reports staff assists her out of bed and two from wheelchair; "they help me to my chair at dialysis... it takes two people to help me walk to bathroom" ADLs Comments: reports previously being very independent (driving, IADLs, going to church, etc). Since admission to SNF, pt reports use of catheter in bed for bathroom, staff assists with ADLs     Hand Dominance   Dominant Hand: Right    Extremity/Trunk Assessment   Upper Extremity Assessment Upper Extremity Assessment: Generalized weakness    Lower Extremity Assessment Lower Extremity Assessment: Difficult to assess due to impaired cognition;RLE deficits/detail;LLE deficits/detail RLE Deficits / Details: R ankle and knee at least 3/5 strength; apparent hip weakness, able to perform hip abd/add and partial range SLR; pt reports, "My back won't let me do this stuff" but unable to expand on this -- difficult to determine extent of true BLE weakness vs. limited by back pain vs. desire to participate; denies numbness/tingling LLE Deficits / Details: L ankle and knee at least 3/5 strength; apparent hip weakness, able to perform hip abd/add and partial range SLR; pt reports, "My back won't let me do this stuff" but unable to expand on this -- difficult to determine extent of true BLE weakness vs. limited by back pain vs. desire to participate; denies numbness/tingling    Cervical / Trunk Assessment Cervical / Trunk Assessment: Normal  Communication   Communication: No difficulties  Cognition Arousal/Alertness: Awake/alert Behavior During Therapy: Flat affect Overall Cognitive Status: No family/caregiver present to determine baseline cognitive functioning Area of Impairment: Orientation, Attention, Memory, Following commands, Safety/judgement, Awareness, Problem solving                 Orientation Level: Disoriented to, Time Current Attention Level: Sustained Memory: Decreased  short-term memory Following Commands: Follows one step commands with increased time, Follows one step commands inconsistently Safety/Judgement: Decreased awareness of safety, Decreased awareness of deficits Awareness: Intellectual Problem Solving: Slow processing, Decreased initiation, Requires verbal cues, Requires tactile cues General Comments: pt with intermittent confusion with inappropriate responses. difficult to reason with importance of OOB activity despite pt reports her goals are to regain independence and asking how she can do this (educ on on role of PT and OOB activity); pt responds, "I can't do exercises, now I'm too tired"        General Comments      Exercises     Assessment/Plan    PT Assessment Patient needs continued PT services  PT Problem List Decreased strength;Decreased activity tolerance;Decreased mobility;Decreased balance;Decreased cognition;Decreased safety awareness;Pain;Decreased knowledge of use of DME       PT Treatment Interventions DME instruction;Gait training;Functional mobility training;Therapeutic activities;Therapeutic exercise;Balance training;Patient/family education;Wheelchair mobility training    PT Goals (Current goals can be found in the Care Plan section)  Acute Rehab PT Goals Patient Stated Goal: "I need to rest, I can't do exercises today" PT Goal Formulation: With patient Time For Goal Achievement: 02/01/22 Potential to Achieve Goals: Fair    Frequency Min 2X/week     Co-evaluation  AM-PAC PT "6 Clicks" Mobility  Outcome Measure Help needed turning from your back to your side while in a flat bed without using bedrails?: A Little Help needed moving from lying on your back to sitting on the side of a flat bed without using bedrails?: A Lot Help needed moving to and from a bed to a chair (including a wheelchair)?: Total Help needed standing up from a chair using your arms (e.g., wheelchair or bedside chair)?:  Total Help needed to walk in hospital room?: Total Help needed climbing 3-5 steps with a railing? : Total 6 Click Score: 9    End of Session   Activity Tolerance: Patient limited by fatigue Patient left: in bed;with call bell/phone within reach;with bed alarm set Nurse Communication: Mobility status PT Visit Diagnosis: Muscle weakness (generalized) (M62.81);Other abnormalities of gait and mobility (R26.89)    Time: 7654-6503 PT Time Calculation (min) (ACUTE ONLY): 11 min   Charges:   PT Evaluation $PT Eval Moderate Complexity: 1 Mod        Mabeline Caras, PT, DPT Acute Rehabilitation Services  Pager [phone] Office [phone]  Derry Lory 01/18/2022, 9:21 AM

## 2022-01-18 NOTE — Consult Note (Signed)
° °  [facility] Inpatient Consult   01/18/2022  Diamond [name] [birth year] [id number]  [facility] [ACO] Patient: [name]  Primary Care Provider:  [name], MD, [facility]    Patient screened for less than 30 days readmission hospitalization with noted high risk score for unplanned readmission risk and  to assess for potential [facility] service needs for post hospital transition.  Review of patient's medical record reveals patient is for possible SNF.  Reviewed for barriers to care and patient may have used her SNF days previously.  Spoke with patient at bedside, patient could not tell this Probation officer if she had been in rehab.  She states, "I only been to dialysis."  Daughter and granddaughter in to visit.   Plan:  Continue to follow progress and disposition to assess for post hospital care management needs.    For questions contact:   Natividad Brood, RN BSN [facility] Liaison  [phone] business mobile phone Toll free office [phone]  Fax number: [phone] [email or web address]

## 2022-01-18 NOTE — Progress Notes (Addendum)
Attending physician's note   I have taken a history, reviewed the chart, and examined the patient. I performed a substantive portion of this encounter, including complete performance of at least one of the key components, in conjunction with the APP. I agree with the APP's note, impression, and recommendations with my edits.   Patient is scheduled for EGD/colonoscopy today, but refused prep overnight and was still having solid stools.  She is now refusing to drink any more prep and refusing any endoscopic procedures, to include EGD/colonoscopy.    HIDA with GB EF 21% but otherwise patent cystic duct without evidence of acute cholecystitis.  No plan for urgent lap chole per surgical service.  GI service will sign off at this time.  Please contact us if she is willing to move forward with EGD/colonoscopy for diagnostic and potentially therapeutic intent.   EDIT: Received a page from the primary Hospitalist that the patient is now agreeable to doing procedures.  Will place orders for bowel prep now with possible EGD/colonoscopy depending on availability in the endoscopy unit.  Continue clears with n.p.o. (except bowel prep) at midnight.  Please encourage patient that she needs to drink the entire bowel preparation as ordered.  [place], DO, FACG [phone] office          Progress Note   Subjective  Chief Complaint: Acute on Chronic anemia, Generalized abdominal pain  Today, patient is found laying in her bed after HIDA scan.  Tells me that she feels drained and "can't do anything else".  It looks like she had drank about 1/4-1/3 of her prep yesterday evening for planned procedures, but tells me she can't do anymore.   Objective   Vital signs in last 24 hours: Temp:  [98.2 F (36.8 C)-99.6 F (37.6 C)] 98.6 F (37 C) (02/13 1229) Pulse Rate:  [80-90] 87 (02/13 1229) Resp:  [16-23] 18 (02/13 1229) BP: (163-256)/(73-128) 163/73 (02/13 1229) SpO2:  [97 %-100 %] 100 %  (02/13 1229) Weight:  [62.2 kg] 62.2 kg (02/12 1948) Last BM Date: 01/18/22 General:    AA female in NAD Heart:  Regular rate and rhythm; no murmurs Lungs: Respirations even and unlabored, lungs CTA bilaterally Abdomen:  Soft, Mild left sided ttp and nondistended. Normal bowel sounds. Extremities:  Without edema. Neurologic:  Alert and oriented,  grossly normal neurologically. Psych:  Uncooperative  Intake/Output from previous day: 02/12 0701 - 02/13 0700 In: 1600 [P.O.:1500; IV Piggyback:100] Out: 250 [Urine:250]  Lab Results: Recent Labs    01/17/22 0631 01/17/22 1449 01/18/22 0345  WBC 4.1 4.6 4.3  HGB 7.9* 7.8* 7.6*  HCT 25.2* 24.1* 23.6*  PLT 234   235 237 234   BMET Recent Labs    01/16/22 1550 01/17/22 0426 01/18/22 0345  NA 137 136 135  K 3.1* 3.2* 4.4  CL 95* 96* 96*  CO2 30 30 28   GLUCOSE 84 88 91  BUN 9 12 20   CREATININE 2.26* 3.33* 4.63*  CALCIUM 8.5* 8.8* 10.0   LFT Recent Labs    01/18/22 0345  PROT 6.3*  ALBUMIN 2.0*  AST 15  ALT 9  ALKPHOS 92  BILITOT 0.5   PT/INR Recent Labs    01/16/22 1605 01/17/22 0631  LABPROT 15.9* 14.9  INR 1.3* 1.2    Studies/Results: MR ABDOMEN MRCP WO CONTRAST  Result Date: 01/17/2022 CLINICAL DATA:  71 year old female with history of abdominal pain and back pain. Possible cholecystitis noted on recent CT examination, in addition  to mass-like thickening of the gallbladder fundus. Further evaluation. EXAM: MRI ABDOMEN WITHOUT CONTRAST  (INCLUDING MRCP) TECHNIQUE: Multiplanar multisequence MR imaging of the abdomen was performed. Heavily T2-weighted images of the biliary and pancreatic ducts were obtained, and three-dimensional MRCP images were rendered by post processing. COMPARISON:  No prior abdominal MRI. CTA of the abdomen and pelvis 01/16/2022. FINDINGS: Comment: Today's study is limited for detection and characterization of visceral and/or vascular lesions by lack of IV gadolinium. Lower chest:  Unremarkable. Hepatobiliary: No definite suspicious cystic or solid hepatic lesions are confidently identified on today's noncontrast examination. There is diffuse low signal intensity throughout the hepatic parenchyma on in phase dual echo images, as well as on T2 weighted sequences, suggesting substantial hepatic iron deposition. No intrahepatic biliary ductal dilatation. Common bile duct is mildly dilated measuring 9 mm in the porta hepatis. No filling defect within the common bile duct to suggest choledocholithiasis. There are numerous filling defects within the gallbladder, largest of which appears impacted in the neck of the gallbladder measuring up to 2.8 cm in diameter. Associated with this there is diffuse gallbladder wall thickening and edema, along with a trace amount of pericholecystic fluid. Notably, there is mass-like thickening of the fundus of the gallbladder (axial image 22 of series 4 and coronal image 23 of series 5) which is heterogeneous in signal intensity on T1 and T2 weighted images and demonstrates internal areas of diffusion restriction, concerning for gallbladder neoplasm (poorly evaluated on today's noncontrast examination). Pancreas: No definite pancreatic mass confidently identified on today's noncontrast examination. Diffuse pancreatic ductal dilatation is noted, with the pancreatic duct measuring up to 5 mm in the body of the pancreas. No peripancreatic fluid collections or inflammatory changes. Spleen: Diffuse low signal intensity throughout the splenic parenchyma on in phase dual echo images and T2 weighted sequences, indicative of splenic iron deposition. Adrenals/Urinary Tract: Numerous T1 hypointense, T2 hyperintense lesions are noted in the kidneys bilaterally, incompletely characterized on today's noncontrast examination, but likely to represent cysts, largest of which is exophytic measuring up to 2.6 cm in the anterolateral aspect of the lower pole of the left kidney. No  hydroureteronephrosis in the visualized portions of the abdomen. Bilateral adrenal glands are normal in appearance. Stomach/Bowel: Visualized portions are unremarkable. Vascular/Lymphatic: No aneurysm identified in the visualized abdominal vasculature. Borderline enlarged portacaval lymph node measuring 1 cm in short axis (axial image 17 of series 4) and hepatoduodenal ligament lymph node (axial image 14 of series 4) measuring 1.2 cm in short axis. Other: No significant volume of ascites noted in the visualized portions of the peritoneal cavity. Small fat containing umbilical hernia. Musculoskeletal: No aggressive appearing osseous lesions are noted in the visualized portions of the skeleton. IMPRESSION: 1. Cholelithiasis with evidence suggestive of acute cholecystitis. 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 Prominent borderline enlarged portacaval and hepatoduodenal ligament lymph nodes are noted. Attention to these at time of cholecystectomy is recommended, as the possibility of metastatic disease is not excluded. 2. Hepatic and splenic hemosiderosis. 3. Dilatation of the common bile duct without intrahepatic biliary ductal dilatation. This could be benign and age-related. No obstructing choledocholithiasis identified on today's examination. Additionally, there is no definite obstructing lesion in the pancreatic head or region of the ampulla. Notably, however, there is also some mild diffuse pancreatic ductal dilatation. If there is clinical concern for subtle neoplasm in the region of the pancreatic head or ampulla, repeat  abdominal MRI with and without IV gadolinium with MRCP could be considered. Please note, however, that per report from the technologist, the patient refused to follow commands and refused to finish today's examination. Electronically Signed   By: Vinnie Langton M.D.   On: 01/17/2022  06:13   NM Hepato W/EF  Result Date: 01/18/2022 CLINICAL DATA:  Recurrent biliary colic EXAM: NUCLEAR MEDICINE HEPATOBILIARY IMAGING WITH GALLBLADDER EF TECHNIQUE: Sequential images of the abdomen were obtained out to 60 minutes following intravenous administration of radiopharmaceutical. After oral ingestion of Ensure, gallbladder ejection fraction was determined. At 60 min, normal ejection fraction is greater than 33%. RADIOPHARMACEUTICALS:  5.4 mCi Tc-62m  Choletec IV COMPARISON:  MRCP 01/17/2022 FINDINGS: Prompt uptake and biliary excretion of activity by the liver is seen. Gallbladder activity is visualized, consistent with patency of cystic duct. Biliary activity passes into small bowel, consistent with patent common bile duct. Calculated gallbladder ejection fraction is 21 %. (Normal gallbladder ejection fraction with Ensure is greater than 33%.) IMPRESSION: 1. Patent cystic duct without evidence for acute cholecystitis. 2. Decreased gallbladder ejection fraction at 21% which may reflect underlying biliary dyskinesis. Electronically Signed   By: Kerby Moors M.D.   On: 01/18/2022 12:35   DG Chest Port 1 View  Result Date: 01/16/2022 CLINICAL DATA:  Sepsis EXAM: PORTABLE CHEST 1 VIEW COMPARISON:  12/26/2021 FINDINGS: Stable positioning of right IJ central line. Stable cardiomediastinal contours. No focal airspace consolidation, pleural effusion, or pneumothorax. IMPRESSION: No active disease. Electronically Signed   By: Davina Poke D.O.   On: 01/16/2022 17:24   CT Angio Abd/Pel W and/or Wo Contrast  Result Date: 01/16/2022 CLINICAL DATA:  Lower GI bleed. EXAM: CTA ABDOMEN AND PELVIS WITHOUT AND WITH CONTRAST TECHNIQUE: Multidetector CT imaging of the abdomen and pelvis was performed using the standard protocol during bolus administration of intravenous contrast. Multiplanar reconstructed images and MIPs were obtained and reviewed to evaluate the vascular anatomy. RADIATION DOSE REDUCTION:  This exam was performed according to the departmental dose-optimization program which includes automated exposure control, adjustment of the mA and/or kV according to patient size and/or use of iterative reconstruction technique. CONTRAST:  37mL OMNIPAQUE IOHEXOL 350 MG/ML SOLN COMPARISON:  CT abdomen pelvis dated 12/17/2021. FINDINGS: VASCULAR Aorta: Mild atherosclerotic calcification. No aneurysmal dilatation or dissection. The aorta is tortuous. Celiac: Patent without evidence of aneurysm, dissection, vasculitis or significant stenosis. SMA: Patent without evidence of aneurysm, dissection, vasculitis or significant stenosis. Renals: Both renal arteries are patent without evidence of aneurysm, dissection, vasculitis, fibromuscular dysplasia or significant stenosis. IMA: Patent without evidence of aneurysm, dissection, vasculitis or significant stenosis. Inflow: Mild atherosclerotic calcification. No aneurysmal dilatation or dissection. The iliac arteries are patent. Proximal Outflow: Bilateral common femoral and visualized portions of the superficial and profunda femoral arteries are patent without evidence of aneurysm, dissection, vasculitis or significant stenosis. Veins: The IVC is unremarkable.  No portal venous gas. Review of the MIP images confirms the above findings. NON-VASCULAR Lower chest: There is a 9 mm right middle lobe nodule. Follow-up as per recommendation of CT of 12/17/2021. The visualized lung bases are otherwise clear. No intra-abdominal free air or free fluid. Hepatobiliary: The liver is unremarkable. No intrahepatic biliary dilatation. Several noncalcified gallstones measure up to 2.7 cm. There is a 3.1 x 3.4 cm masslike thickening of the gallbladder fundus. Although this may represent adenomyomatosis, gallbladder malignancy is not excluded. Further characterization with MRI without and with contrast is recommended. There is gallbladder wall edema. Pancreas: Unremarkable. No pancreatic ductal  dilatation or surrounding inflammatory changes. Spleen: Small splenic calcification. Adrenals/Urinary Tract: The adrenal glands unremarkable. There is no hydronephrosis or nephrolithiasis on either side. Focus of cortical calcification in the inferior pole of the right kidney. Several small left renal cysts and additional bilateral hypodense lesions which are too small to characterize. The kidneys are mildly atrophic. The visualized ureters and urinary bladder appear unremarkable. Stomach/Bowel: There is moderate stool throughout the colon. There is no bowel obstruction or active inflammation. The appendix is normal. No evidence of active GI bleed. Lymphatic: No adenopathy. Reproductive: The uterus and ovaries are grossly unremarkable. Other: None Musculoskeletal: Osteopenia with degenerative changes of the spine. There is extensive erosive changes with endplate irregularity, fragmentation, and sclerotic changes and associated soft tissue thickening involving L4-L5 most consistent with spondylo discitis. Further evaluation with MRI without and with contrast is recommended. IMPRESSION: 1. No evidence of active GI bleed. 2. Cholelithiasis with gallbladder wall edema. If there is clinical concern for acute cholecystitis, further evaluation with ultrasound or HIDA scan recommended. 3. Masslike thickening of the gallbladder fundus may represent advanced adenomyomatosis versus neoplasm. Further evaluation with MRI without and with contrast is recommended. 4. 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 Further evaluation with lumbar spine MRI without and with contrast is recommended. 5. There is a 9 mm right middle lobe nodule. Follow-up as per recommendation of prior CT. 6. Aortic Atherosclerosis (ICD10-I70.0). Electronically Signed   By: Anner Crete M.D.   On: 01/16/2022 20:58     Assessment / Plan:   Assessment: Acute on Chronic Anemia: hgb 8.1 (10.6 two weeks ago)-->7.6, heme + stool, no overt bleeding, plans were  for EGD/Colonoscopy but patient unable to finish bowel prep and now refusing procedures Generalized abdominal pain:Abd/pelvic CT showed cholelithiasis with a masslike thickening of the gallbladder fundus.  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  CBD dilatation without intrahepatic biliary ductal dilatation.  Mild diffuse pancreatic ductal dilatation was noted without an obvious pancreatic mass.  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  Went to see patient today and she tells me she just "can't do anymore procedures".  She is now refusing EGD or Colonoscopy. Appreciate surgical plans going forward Would recommend continued monitoring of hgb going forward and blood transfusion/ iron infusion as needed We will sign off for now as patient declining GI workup.  If she is willing to pursue in the future please call us back  Thank you for your kind consultation, we will sign off.   LOS: 1 day   Levin Erp  01/18/2022, 2:36 PM

## 2022-01-18 NOTE — Progress Notes (Signed)
PROGRESS NOTE                                                                                                                                                                                                             Patient Demographics:    Diamond [name], is a 71 y.o. female, DOB - 01/13/1951, [id number]  Outpatient Primary MD for the patient is Osei-Bonsu, Iona Beard, MD    LOS - 1  Admit date - 01/16/2022    Chief Complaint  Patient presents with   Abdominal Pain       Brief Narrative (HPI from H&P) 71 year old African-American female with known past medical history of end-stage renal disease (HD Tues, Thurs, Sat), asthma, hypertension, anemia of chronic disease, atrial flutter (on eliquis) and recent diagnosis of suspected infective endocarditis with right atrial mass as well as MRSA bacteremia with lumbar discitis who presents to [facility] emergency department via EMS with complaints of abdominal pain.  In the ER was suggestive of Hemoccult positive stool, acute on chronic anemia along with possible cholecystitis and she was admitted.   Subjective:   Patient in bed, appears comfortable, denies any headache, no fever, no chest pain or pressure, no shortness of breath , improved abdominal pain. Chornic mid back pain,  No new focal weakness.    Assessment  & Plan :    Acute abdominal pain mostly in the right upper quadrant.  Suspicious for acute or subacute cholecystitis - MRCP has been noted with possible cholecystitis and gallbladder mass -  GI and general surgery have been consulted, for now clear liquid diet, Rocephin and Flagyl antibiotic empiric coverage.  Pending HIDA scan and have GI and general surgery manage this issue.  2.  Acute anemia with GI blood loss on top of anemia of chronic disease in the ESRD patient.  Baseline hemoglobin seems to be around 10, she was recently placed on Eliquis, unclear  whether it is upper or lower GI blood loss but could be subacute in nature, for now continue to hold Eliquis, IV PPI twice daily.  GI consulted and due for EGD and colonoscopy on 01/18/2022.  Monitor H&H closely if drops close to 7 will transfuse.  Patient has consented for transfusion if it is needed.   3.  Acute infective endocarditis and lumbar discitis with MRSA bacteremia diagnosed recently.  On IV vancomycin with dialysis treatments.  Stop date 02/13/2022.  Pharmacy consulted.   4.  ESRD.  TTS schedule nephrology consulted .  5. Atrial flutter with [place] vas 2 score of greater than 3.  Have doubled Lopressor dose, Eliquis on hold due to suspicion of GI bleed will monitor.  6.  Hypertension.  Beta-blocker dose doubled, Norvasc and hydralazine added for better control along with as needed hydralazine.  7.  History of gout.  On allopurinol continue.       Condition - Extremely Guarded  Family Communication  :    Glenna Fellows [phone] updated on 01/17/22, 01/18/22 message left at 8:25 AM  Daughter [name] [phone] on 01/17/2022 message left at 9:50 AM.   Code Status : Full  Consults  : GI, general surgery, nephrology  PUD Prophylaxis :  PPI   Procedures  :     MRCP - 1. Cholelithiasis with evidence suggestive of acute cholecystitis. 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 Prominent borderline enlarged portacaval and hepatoduodenal ligament lymph nodes are noted. Attention to these at time of cholecystectomy is recommended, as the possibility of metastatic disease is not excluded. 2. Hepatic and splenic hemosiderosis. 3. Dilatation of the common bile duct without intrahepatic biliary ductal dilatation. This could be benign and age-related. No obstructing choledocholithiasis identified on today's examination. Additionally, there is no definite obstructing lesion in the  pancreatic head or region of the ampulla. Notably, however, there is also some mild diffuse pancreatic ductal dilatation.  CT - 1. No evidence of active GI bleed. 2. Cholelithiasis with gallbladder wall edema. If there is clinical concern for acute cholecystitis, further evaluation with ultrasound or HIDA scan recommended. 3. Masslike thickening of the gallbladder fundus may represent advanced adenomyomatosis versus neoplasm. Further evaluation with MRI without and with contrast is recommended. 4. 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 Further evaluation with lumbar spine MRI without and with contrast is recommended. 5. There is a 9 mm right middle lobe nodule      Disposition Plan  :  Inpt  DVT Prophylaxis  :    Place and maintain sequential compression device Start: 01/17/22 0629 SCDs Start: 01/16/22 2258    Lab Results  Component Value Date   PLT 234 01/18/2022    Diet :  Diet Order             Diet NPO time specified Except for: Ice Chips, Sips with Meds, Other (See Comments)  Diet effective midnight                    Inpatient Medications  Scheduled Meds:  sodium chloride   Intravenous Once   allopurinol  200 mg Oral Daily   amLODipine  10 mg Oral Daily   Chlorhexidine Gluconate Cloth  6 each Topical Daily   cyclobenzaprine  2.5 mg Oral Daily   gabapentin  100 mg Oral QHS   hydrALAZINE  50 mg Oral Q8H   metoprolol tartrate  100 mg Oral BID   metroNIDAZOLE  500 mg Oral Q12H   pantoprazole (PROTONIX) IV  40 mg Intravenous Q12H   senna-docusate  2 tablet Oral BID   sevelamer carbonate  1,600 mg Oral TID WC   Continuous Infusions:  cefTRIAXone (ROCEPHIN)  IV Stopped (01/17/22 1146)   [START ON 01/19/2022] vancomycin     PRN Meds:.acetaminophen **OR** acetaminophen, albuterol, hydrALAZINE, ondansetron **OR** ondansetron (ZOFRAN) IV, polyethylene  glycol, white petrolatum  Antibiotics  :    Anti-infectives (From admission, onward)    Start     Dose/Rate Route  Frequency Ordered Stop   01/19/22 1200  vancomycin (VANCOREADY) IVPB 500 mg/100 mL  Status:  Discontinued        500 mg 100 mL/hr over 60 Minutes Intravenous Every T-Th-Sa (Hemodialysis) 01/16/22 2258 01/17/22 1400   01/19/22 1200  vancomycin (VANCOREADY) IVPB 500 mg/100 mL  Status:  Discontinued        750 mg 150 mL/hr over 60 Minutes Intravenous Every T-Th-Sa (Hemodialysis) 01/17/22 1400 01/17/22 1415   01/19/22 1200  vancomycin (VANCOREADY) IVPB 750 mg/150 mL        750 mg 150 mL/hr over 60 Minutes Intravenous Every T-Th-Sa (Hemodialysis) 01/17/22 1415     01/17/22 2100  vancomycin (VANCOREADY) IVPB 750 mg/150 mL        750 mg 150 mL/hr over 60 Minutes Intravenous  Once 01/17/22 1936 01/17/22 2301   01/17/22 1000  cefTRIAXone (ROCEPHIN) 2 g in sodium chloride 0.9 % 100 mL IVPB        2 g 200 mL/hr over 30 Minutes Intravenous Every 24 hours 01/17/22 0949 01/22/22 0959   01/17/22 1000  metroNIDAZOLE (FLAGYL) tablet 500 mg       Note to Pharmacy: Pharmacy can adjust for possible cholecystitis   500 mg Oral Every 12 hours 01/17/22 0949          Time Spent in minutes  30   Lala Lund M.D on 01/18/2022 at 8:22 AM  To page go to [email or web address]   [facility] -  Office  [phone]  See all Orders from today for further details    Objective:   Vitals:   01/17/22 1900 01/17/22 1948 01/17/22 2343 01/18/22 0503  BP: (!) 244/118 (!) 172/128 (!) 189/104 (!) 178/87  Pulse: 90 88 84 90  Resp: 17 18 17 18   Temp:  98.9 F (37.2 C) 98.9 F (37.2 C) 98.2 F (36.8 C)  TempSrc:  Oral Oral Oral  SpO2: 100% 97% 100% 100%  Weight:  62.2 kg    Height:        Wt Readings from Last 3 Encounters:  01/17/22 62.2 kg  01/13/22 64.9 kg  12/29/21 65 kg     Intake/Output Summary (Last 24 hours) at 01/18/2022 8768 Last data filed at 01/18/2022 0415 Gross per 24 hour  Intake 1600 ml  Output 250 ml  Net 1350 ml     Physical Exam  Awake Alert, No new F.N deficits, right IJ  HD catheter in place [place].AT,PERRAL Supple Neck, No JVD,   Symmetrical Chest wall movement, Good air movement bilaterally, CTAB RRR,No Gallops, Rubs or new Murmurs,  +ve B.Sounds, Abd Soft, minimal right upper quadrant abdominal tenderness, No Cyanosis, Clubbing or edema        Data Review:    CBC Recent Labs  Lab 01/16/22 1550 01/17/22 0426 01/17/22 0631 01/17/22 1449 01/18/22 0345  WBC 4.3 4.5 4.1 4.6 4.3  HGB 8.1* 7.0* 7.9* 7.8* 7.6*  HCT 26.6* 21.7* 25.2* 24.1* 23.6*  PLT 249 216 234   235 237 234  MCV 98.5 96.4 96.6 96.4 95.5  MCH 30.0 31.1 30.3 31.2 30.8  MCHC 30.5 32.3 31.3 32.4 32.2  RDW 15.6* 15.6* 15.7* 15.7* 15.6*  LYMPHSABS 0.8  --   --   --  1.0  MONOABS 0.4  --   --   --  0.5  EOSABS 0.1  --   --   --  0.2  BASOSABS 0.0  --   --   --  0.0    Electrolytes Recent Labs  Lab 01/16/22 1550 01/16/22 1605 01/17/22 0426 01/17/22 0629 01/17/22 0631 01/18/22 0345  NA 137  --  136  --   --  135  K 3.1*  --  3.2*  --   --  4.4  CL 95*  --  96*  --   --  96*  CO2 30  --  30  --   --  28  GLUCOSE 84  --  88  --   --  91  BUN 9  --  12  --   --  20  CREATININE 2.26*  --  3.33*  --   --  4.63*  CALCIUM 8.5*  --  8.8*  --   --  10.0  AST 17  --  13*  --   --  15  ALT 11  --  9  --   --  9  ALKPHOS 114  --  100  --   --  92  BILITOT 0.4  --  0.5  --   --  0.5  ALBUMIN 2.3*  --  2.0*  --   --  2.0*  MG  --   --  2.0  --   --  2.0  CRP  --   --  13.4* 13.1*  --  12.5*  DDIMER  --   --   --   --  3.58*  --   LATICACIDVEN  --  1.7  --   --   --   --   INR  --  1.3*  --   --  1.2  --   BNP  --   --   --   --   --  1,628.1*    ------------------------------------------------------------------------------------------------------------------ No results for input(s): CHOL, HDL, LDLCALC, TRIG, CHOLHDL, LDLDIRECT in the last 72 hours.  Lab Results  Component Value Date   HGBA1C 5.4 10/18/2019    No results for input(s): TSH, T4TOTAL, T3FREE, THYROIDAB in the  last 72 hours.  Invalid input(s): FREET3 ------------------------------------------------------------------------------------------------------------------ ID Labs Recent Labs  Lab 01/16/22 1550 01/16/22 1605 01/17/22 0426 01/17/22 0629 01/17/22 0631 01/17/22 1449 01/18/22 0345  WBC 4.3  --  4.5  --  4.1 4.6 4.3  PLT 249  --  216  --  234   235 237 234  CRP  --   --  13.4* 13.1*  --   --  12.5*  DDIMER  --   --   --   --  3.58*  --   --   LATICACIDVEN  --  1.7  --   --   --   --   --   CREATININE 2.26*  --  3.33*  --   --   --  4.63*   Cardiac Enzymes No results for input(s): CKMB, TROPONINI, MYOGLOBIN in the last 168 hours.  Invalid input(s): CK   Radiology Reports MR ABDOMEN MRCP WO CONTRAST  Result Date: 01/17/2022 CLINICAL DATA:  71 year old female with history of abdominal pain and back pain. Possible cholecystitis noted on recent CT examination, in addition to mass-like thickening of the gallbladder fundus. Further evaluation. EXAM: MRI ABDOMEN WITHOUT CONTRAST  (INCLUDING MRCP) TECHNIQUE: Multiplanar multisequence MR imaging of the abdomen was performed. Heavily T2-weighted images of the biliary and pancreatic ducts were obtained, and three-dimensional MRCP images were rendered by post processing. COMPARISON:  No prior  abdominal MRI. CTA of the abdomen and pelvis 01/16/2022. FINDINGS: Comment: Today's study is limited for detection and characterization of visceral and/or vascular lesions by lack of IV gadolinium. Lower chest: Unremarkable. Hepatobiliary: No definite suspicious cystic or solid hepatic lesions are confidently identified on today's noncontrast examination. There is diffuse low signal intensity throughout the hepatic parenchyma on in phase dual echo images, as well as on T2 weighted sequences, suggesting substantial hepatic iron deposition. No intrahepatic biliary ductal dilatation. Common bile duct is mildly dilated measuring 9 mm in the porta hepatis. No filling  defect within the common bile duct to suggest choledocholithiasis. There are numerous filling defects within the gallbladder, largest of which appears impacted in the neck of the gallbladder measuring up to 2.8 cm in diameter. Associated with this there is diffuse gallbladder wall thickening and edema, along with a trace amount of pericholecystic fluid. Notably, there is mass-like thickening of the fundus of the gallbladder (axial image 22 of series 4 and coronal image 23 of series 5) which is heterogeneous in signal intensity on T1 and T2 weighted images and demonstrates internal areas of diffusion restriction, concerning for gallbladder neoplasm (poorly evaluated on today's noncontrast examination). Pancreas: No definite pancreatic mass confidently identified on today's noncontrast examination. Diffuse pancreatic ductal dilatation is noted, with the pancreatic duct measuring up to 5 mm in the body of the pancreas. No peripancreatic fluid collections or inflammatory changes. Spleen: Diffuse low signal intensity throughout the splenic parenchyma on in phase dual echo images and T2 weighted sequences, indicative of splenic iron deposition. Adrenals/Urinary Tract: Numerous T1 hypointense, T2 hyperintense lesions are noted in the kidneys bilaterally, incompletely characterized on today's noncontrast examination, but likely to represent cysts, largest of which is exophytic measuring up to 2.6 cm in the anterolateral aspect of the lower pole of the left kidney. No hydroureteronephrosis in the visualized portions of the abdomen. Bilateral adrenal glands are normal in appearance. Stomach/Bowel: Visualized portions are unremarkable. Vascular/Lymphatic: No aneurysm identified in the visualized abdominal vasculature. Borderline enlarged portacaval lymph node measuring 1 cm in short axis (axial image 17 of series 4) and hepatoduodenal ligament lymph node (axial image 14 of series 4) measuring 1.2 cm in short axis. Other: No  significant volume of ascites noted in the visualized portions of the peritoneal cavity. Small fat containing umbilical hernia. Musculoskeletal: No aggressive appearing osseous lesions are noted in the visualized portions of the skeleton. IMPRESSION: 1. Cholelithiasis with evidence suggestive of acute cholecystitis. 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 Prominent borderline enlarged portacaval and hepatoduodenal ligament lymph nodes are noted. Attention to these at time of cholecystectomy is recommended, as the possibility of metastatic disease is not excluded. 2. Hepatic and splenic hemosiderosis. 3. Dilatation of the common bile duct without intrahepatic biliary ductal dilatation. This could be benign and age-related. No obstructing choledocholithiasis identified on today's examination. Additionally, there is no definite obstructing lesion in the pancreatic head or region of the ampulla. Notably, however, there is also some mild diffuse pancreatic ductal dilatation. If there is clinical concern for subtle neoplasm in the region of the pancreatic head or ampulla, repeat abdominal MRI with and without IV gadolinium with MRCP could be considered. Please note, however, that per report from the technologist, the patient refused to follow commands and refused to finish today's examination. Electronically Signed   By: Vinnie Langton M.D.   On: 01/17/2022 06:13   DG Chest  Port 1 View  Result Date: 01/16/2022 CLINICAL DATA:  Sepsis EXAM: PORTABLE CHEST 1 VIEW COMPARISON:  12/26/2021 FINDINGS: Stable positioning of right IJ central line. Stable cardiomediastinal contours. No focal airspace consolidation, pleural effusion, or pneumothorax. IMPRESSION: No active disease. Electronically Signed   By: Davina Poke D.O.   On: 01/16/2022 17:24   CT Angio Abd/Pel W and/or Wo Contrast  Result Date:  01/16/2022 CLINICAL DATA:  Lower GI bleed. EXAM: CTA ABDOMEN AND PELVIS WITHOUT AND WITH CONTRAST TECHNIQUE: Multidetector CT imaging of the abdomen and pelvis was performed using the standard protocol during bolus administration of intravenous contrast. Multiplanar reconstructed images and MIPs were obtained and reviewed to evaluate the vascular anatomy. RADIATION DOSE REDUCTION: This exam was performed according to the departmental dose-optimization program which includes automated exposure control, adjustment of the mA and/or kV according to patient size and/or use of iterative reconstruction technique. CONTRAST:  23mL OMNIPAQUE IOHEXOL 350 MG/ML SOLN COMPARISON:  CT abdomen pelvis dated 12/17/2021. FINDINGS: VASCULAR Aorta: Mild atherosclerotic calcification. No aneurysmal dilatation or dissection. The aorta is tortuous. Celiac: Patent without evidence of aneurysm, dissection, vasculitis or significant stenosis. SMA: Patent without evidence of aneurysm, dissection, vasculitis or significant stenosis. Renals: Both renal arteries are patent without evidence of aneurysm, dissection, vasculitis, fibromuscular dysplasia or significant stenosis. IMA: Patent without evidence of aneurysm, dissection, vasculitis or significant stenosis. Inflow: Mild atherosclerotic calcification. No aneurysmal dilatation or dissection. The iliac arteries are patent. Proximal Outflow: Bilateral common femoral and visualized portions of the superficial and profunda femoral arteries are patent without evidence of aneurysm, dissection, vasculitis or significant stenosis. Veins: The IVC is unremarkable.  No portal venous gas. Review of the MIP images confirms the above findings. NON-VASCULAR Lower chest: There is a 9 mm right middle lobe nodule. Follow-up as per recommendation of CT of 12/17/2021. The visualized lung bases are otherwise clear. No intra-abdominal free air or free fluid. Hepatobiliary: The liver is unremarkable. No intrahepatic  biliary dilatation. Several noncalcified gallstones measure up to 2.7 cm. There is a 3.1 x 3.4 cm masslike thickening of the gallbladder fundus. Although this may represent adenomyomatosis, gallbladder malignancy is not excluded. Further characterization with MRI without and with contrast is recommended. There is gallbladder wall edema. Pancreas: Unremarkable. No pancreatic ductal dilatation or surrounding inflammatory changes. Spleen: Small splenic calcification. Adrenals/Urinary Tract: The adrenal glands unremarkable. There is no hydronephrosis or nephrolithiasis on either side. Focus of cortical calcification in the inferior pole of the right kidney. Several small left renal cysts and additional bilateral hypodense lesions which are too small to characterize. The kidneys are mildly atrophic. The visualized ureters and urinary bladder appear unremarkable. Stomach/Bowel: There is moderate stool throughout the colon. There is no bowel obstruction or active inflammation. The appendix is normal. No evidence of active GI bleed. Lymphatic: No adenopathy. Reproductive: The uterus and ovaries are grossly unremarkable. Other: None Musculoskeletal: Osteopenia with degenerative changes of the spine. There is extensive erosive changes with endplate irregularity, fragmentation, and sclerotic changes and associated soft tissue thickening involving L4-L5 most consistent with spondylo discitis. Further evaluation with MRI without and with contrast is recommended. IMPRESSION: 1. No evidence of active GI bleed. 2. Cholelithiasis with gallbladder wall edema. If there is clinical concern for acute cholecystitis, further evaluation with ultrasound or HIDA scan recommended. 3. Masslike thickening of the gallbladder fundus may represent advanced adenomyomatosis versus neoplasm. Further evaluation with MRI without and with contrast is recommended. 4. 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 Further evaluation with lumbar spine MRI  without  and with contrast is recommended. 5. There is a 9 mm right middle lobe nodule. Follow-up as per recommendation of prior CT. 6. Aortic Atherosclerosis (ICD10-I70.0). Electronically Signed   By: Anner Crete M.D.   On: 01/16/2022 20:58

## 2022-01-18 NOTE — TOC Progression Note (Addendum)
Transition of Care [facility]) - Initial/Assessment Note    Patient Details  Name: Diamond [name] MRN: [id number] Date of Birth: 09/04/1951  [facility]) CM/SW Contact:    Milinda Antis, [place] Phone Number: 01/18/2022, 10:39 AM  Clinical Narrative:                 CSW attempted to meet with the patient to discuss SNF placement.  The patient was not in the room at this time.  CSW will attempt to meet with the patient later today.    CSW was informed that patient is from [facility].  CSW attempted to contact admissions with [facility] to confirm.  There was no answer. CSW left a secure VM requesting a returned call.   CSW spoke with the patient's daughter Diamond [name] [phone]) and son Diamond [name] [phone]).  The family is inquiring about the patient's discharge.  The family reports that the patient is from [facility], but she has used her 21 days and the family is unable to afford the [place].  CSW explained the home health and SNF process.  The family will inform CSW of their decision.     Patient Goals and CMS Choice        Expected Discharge Plan and Services                                                Prior Living Arrangements/Services                       Activities of Daily Living      Permission Sought/Granted                  Emotional Assessment              Admission diagnosis:  Acute blood loss anemia [D62] Generalized abdominal pain [R10.84] Anemia, unspecified type [D64.9] Patient Active Problem List   Diagnosis Date Noted   Gallbladder mass 01/17/2022   Acute blood loss anemia 01/16/2022   Acute infective endocarditis 01/16/2022   Septic discitis of lumbar region 01/16/2022   Hypotension 12/27/2021   Bleeding from dialysis catheter site 12/26/2021   Atrial flutter ([place]) 12/25/2021   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 seen on TEE on 1/17 12/24/2021   Lung nodule seen on imaging study  12/23/2021   MRSA bacteremia    Vertebral osteomyelitis ([place])    Leukocytosis 12/17/2021   Severe sepsis ([place]) due to MRSA bacteremia, L5-S1 discitis and endocarditis 12/17/2021   Lactic acidosis 12/17/2021   Low back pain 12/17/2021   ESRD on hemodialysis ([place]) 12/17/2021   Mild intermittent asthma 12/17/2021   Vitamin D deficiency 01/02/2021   Hyperparathyroidism ([place]) 12/31/2020   CKD (chronic kidney disease), stage III ([place]) 11/10/2019   Cardiomegaly 11/05/2019   Hyperkalemia 11/05/2019   Hypokalemia 10/18/2019   Pneumonia due to COVID-19 virus 10/17/2019   CAP (community acquired pneumonia) 10/16/2019   Essential hypertension    Asthma    Gout    Hyponatremia    Multifactorial anemia-acute blood loss from bleeding HD catheter site-superimposed on anemia related to ESRD.    PCP:  [name], MD Pharmacy:   [place], [place] - [address] [place] [address] Phone: [phone] Fax: [phone]     Social Determinants of Health (  SDOH) Interventions    Readmission Risk Interventions No flowsheet data found.

## 2022-01-18 NOTE — Progress Notes (Signed)
HIDA negative for acute cholecystitis. Would not recommend lap chole urgently at this point based on that, but still would recommend workup due to concern for possible malignancy. GI following for GI bleeding as well and planning EGD/colonoscopy possibly tomorrow. Dr. Redmond Pulling discussed this patient with Dr. Zenia Resides as well, obviously GI bleeding and other medical issues are more pressing. But she did say that she would consider diagnostic laparoscopy with possible lap chole with intra-op frozen and if positive central liver resection with lymphadenectomy on a non-urgent or even outpatient basis if patient would consider that. Will order CA 19-9 in the meantime and follow in chart peripherally for results of EGD/colonoscopy.   Norm Parcel, [facility] 01/18/2022, 2:20 PM Please see Amion for pager number during day hours 7:00am-4:30pm

## 2022-01-18 NOTE — Progress Notes (Addendum)
[facility] Progress Note   Subjective:   Patient seen and examined at bedside.  Feeling ok.  States she doesn't know if she will fill up to dialysis tomorrow after being NPO after midnight.  Discussed planning for HD later in the day after eating.   Objective Vitals:   01/17/22 1948 01/17/22 2343 01/18/22 0503 01/18/22 0840  BP: (!) 172/128 (!) 189/104 (!) 178/87 (!) 174/97  Pulse: 88 84 90 80  Resp: 18 17 18 18   Temp: 98.9 F (37.2 C) 98.9 F (37.2 C) 98.2 F (36.8 C) 98.2 F (36.8 C)  TempSrc: Oral Oral Oral Oral  SpO2: 97% 100% 100% 100%  Weight: 62.2 kg     Height:       Physical Exam General:chronically ill appearing female in NAD Heart:RRR, no mrg Lungs:CTAB, nml WOB on RA Abdomen:soft, NTND Extremities:no LE edema Dialysis Access: TDC, LU AVF +b/t  Filed Weights   01/16/22 1515 01/17/22 1948  Weight: 65 kg 62.2 kg    Intake/Output Summary (Last 24 hours) at 01/18/2022 1041 Last data filed at 01/18/2022 0415 Gross per 24 hour  Intake 1600 ml  Output 250 ml  Net 1350 ml    Additional Objective Labs: Basic Metabolic Panel: Recent Labs  Lab 01/16/22 1550 01/17/22 0426 01/18/22 0345  NA 137 136 135  K 3.1* 3.2* 4.4  CL 95* 96* 96*  CO2 30 30 28   GLUCOSE 84 88 91  BUN 9 12 20   CREATININE 2.26* 3.33* 4.63*  CALCIUM 8.5* 8.8* 10.0   Liver Function Tests: Recent Labs  Lab 01/16/22 1550 01/17/22 0426 01/18/22 0345  AST 17 13* 15  ALT 11 9 9   ALKPHOS 114 100 92  BILITOT 0.4 0.5 0.5  PROT 7.0 5.8* 6.3*  ALBUMIN 2.3* 2.0* 2.0*   Recent Labs  Lab 01/16/22 1550  LIPASE 42   CBC: Recent Labs  Lab 01/16/22 1550 01/17/22 0426 01/17/22 0631 01/17/22 1449 01/18/22 0345  WBC 4.3 4.5 4.1 4.6 4.3  NEUTROABS 2.9  --   --   --  2.5  HGB 8.1* 7.0* 7.9* 7.8* 7.6*  HCT 26.6* 21.7* 25.2* 24.1* 23.6*  MCV 98.5 96.4 96.6 96.4 95.5  PLT 249 216 234   235 237 234   Blood Culture    Component Value Date/Time   SDES BLOOD RIGHT ARM  01/16/2022 1830   SPECREQUEST  01/16/2022 1830    BOTTLES DRAWN AEROBIC ONLY Blood Culture results may not be optimal due to an inadequate volume of blood received in culture bottles   CULT  01/16/2022 1830    NO GROWTH 2 DAYS Performed at [facility], [address], [place], [place] [address]    REPTSTATUS PENDING 01/16/2022 1830   CBG: Recent Labs  Lab 01/17/22 0045  GLUCAP 94   Iron Studies:  Recent Labs    01/17/22 0629  IRON 38  TIBC 154*  FERRITIN 1,471*   Lab Results  Component Value Date   INR 1.2 01/17/2022   INR 1.3 (H) 01/16/2022   INR 1.0 10/16/2019   Studies/Results: MR ABDOMEN MRCP WO CONTRAST  Result Date: 01/17/2022 CLINICAL DATA:  71 year old female with history of abdominal pain and back pain. Possible cholecystitis noted on recent CT examination, in addition to mass-like thickening of the gallbladder fundus. Further evaluation. EXAM: MRI ABDOMEN WITHOUT CONTRAST  (INCLUDING MRCP) TECHNIQUE: Multiplanar multisequence MR imaging of the abdomen was performed. Heavily T2-weighted images of the biliary and pancreatic ducts were obtained, and  three-dimensional MRCP images were rendered by post processing. COMPARISON:  No prior abdominal MRI. CTA of the abdomen and pelvis 01/16/2022. FINDINGS: Comment: Today's study is limited for detection and characterization of visceral and/or vascular lesions by lack of IV gadolinium. Lower chest: Unremarkable. Hepatobiliary: No definite suspicious cystic or solid hepatic lesions are confidently identified on today's noncontrast examination. There is diffuse low signal intensity throughout the hepatic parenchyma on in phase dual echo images, as well as on T2 weighted sequences, suggesting substantial hepatic iron deposition. No intrahepatic biliary ductal dilatation. Common bile duct is mildly dilated measuring 9 mm in the porta hepatis. No filling defect within the common bile duct to suggest choledocholithiasis. There are  numerous filling defects within the gallbladder, largest of which appears impacted in the neck of the gallbladder measuring up to 2.8 cm in diameter. Associated with this there is diffuse gallbladder wall thickening and edema, along with a trace amount of pericholecystic fluid. Notably, there is mass-like thickening of the fundus of the gallbladder (axial image 22 of series 4 and coronal image 23 of series 5) which is heterogeneous in signal intensity on T1 and T2 weighted images and demonstrates internal areas of diffusion restriction, concerning for gallbladder neoplasm (poorly evaluated on today's noncontrast examination). Pancreas: No definite pancreatic mass confidently identified on today's noncontrast examination. Diffuse pancreatic ductal dilatation is noted, with the pancreatic duct measuring up to 5 mm in the body of the pancreas. No peripancreatic fluid collections or inflammatory changes. Spleen: Diffuse low signal intensity throughout the splenic parenchyma on in phase dual echo images and T2 weighted sequences, indicative of splenic iron deposition. Adrenals/Urinary Tract: Numerous T1 hypointense, T2 hyperintense lesions are noted in the kidneys bilaterally, incompletely characterized on today's noncontrast examination, but likely to represent cysts, largest of which is exophytic measuring up to 2.6 cm in the anterolateral aspect of the lower pole of the left kidney. No hydroureteronephrosis in the visualized portions of the abdomen. Bilateral adrenal glands are normal in appearance. Stomach/Bowel: Visualized portions are unremarkable. Vascular/Lymphatic: No aneurysm identified in the visualized abdominal vasculature. Borderline enlarged portacaval lymph node measuring 1 cm in short axis (axial image 17 of series 4) and hepatoduodenal ligament lymph node (axial image 14 of series 4) measuring 1.2 cm in short axis. Other: No significant volume of ascites noted in the visualized portions of the  peritoneal cavity. Small fat containing umbilical hernia. Musculoskeletal: No aggressive appearing osseous lesions are noted in the visualized portions of the skeleton. IMPRESSION: 1. Cholelithiasis with evidence suggestive of acute cholecystitis. 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 Prominent borderline enlarged portacaval and hepatoduodenal ligament lymph nodes are noted. Attention to these at time of cholecystectomy is recommended, as the possibility of metastatic disease is not excluded. 2. Hepatic and splenic hemosiderosis. 3. Dilatation of the common bile duct without intrahepatic biliary ductal dilatation. This could be benign and age-related. No obstructing choledocholithiasis identified on today's examination. Additionally, there is no definite obstructing lesion in the pancreatic head or region of the ampulla. Notably, however, there is also some mild diffuse pancreatic ductal dilatation. If there is clinical concern for subtle neoplasm in the region of the pancreatic head or ampulla, repeat abdominal MRI with and without IV gadolinium with MRCP could be considered. Please note, however, that per report from the technologist, the patient refused to follow commands and refused to finish today's examination. Electronically Signed   By: Quillian Quince  Entrikin M.D.   On: 01/17/2022 06:13   DG Chest Port 1 View  Result Date: 01/16/2022 CLINICAL DATA:  Sepsis EXAM: PORTABLE CHEST 1 VIEW COMPARISON:  12/26/2021 FINDINGS: Stable positioning of right IJ central line. Stable cardiomediastinal contours. No focal airspace consolidation, pleural effusion, or pneumothorax. IMPRESSION: No active disease. Electronically Signed   By: Davina Poke D.O.   On: 01/16/2022 17:24   CT Angio Abd/Pel W and/or Wo Contrast  Result Date: 01/16/2022 CLINICAL DATA:  Lower GI bleed. EXAM: CTA ABDOMEN AND PELVIS WITHOUT  AND WITH CONTRAST TECHNIQUE: Multidetector CT imaging of the abdomen and pelvis was performed using the standard protocol during bolus administration of intravenous contrast. Multiplanar reconstructed images and MIPs were obtained and reviewed to evaluate the vascular anatomy. RADIATION DOSE REDUCTION: This exam was performed according to the departmental dose-optimization program which includes automated exposure control, adjustment of the mA and/or kV according to patient size and/or use of iterative reconstruction technique. CONTRAST:  47mL OMNIPAQUE IOHEXOL 350 MG/ML SOLN COMPARISON:  CT abdomen pelvis dated 12/17/2021. FINDINGS: VASCULAR Aorta: Mild atherosclerotic calcification. No aneurysmal dilatation or dissection. The aorta is tortuous. Celiac: Patent without evidence of aneurysm, dissection, vasculitis or significant stenosis. SMA: Patent without evidence of aneurysm, dissection, vasculitis or significant stenosis. Renals: Both renal arteries are patent without evidence of aneurysm, dissection, vasculitis, fibromuscular dysplasia or significant stenosis. IMA: Patent without evidence of aneurysm, dissection, vasculitis or significant stenosis. Inflow: Mild atherosclerotic calcification. No aneurysmal dilatation or dissection. The iliac arteries are patent. Proximal Outflow: Bilateral common femoral and visualized portions of the superficial and profunda femoral arteries are patent without evidence of aneurysm, dissection, vasculitis or significant stenosis. Veins: The IVC is unremarkable.  No portal venous gas. Review of the MIP images confirms the above findings. NON-VASCULAR Lower chest: There is a 9 mm right middle lobe nodule. Follow-up as per recommendation of CT of 12/17/2021. The visualized lung bases are otherwise clear. No intra-abdominal free air or free fluid. Hepatobiliary: The liver is unremarkable. No intrahepatic biliary dilatation. Several noncalcified gallstones measure up to 2.7 cm. There  is a 3.1 x 3.4 cm masslike thickening of the gallbladder fundus. Although this may represent adenomyomatosis, gallbladder malignancy is not excluded. Further characterization with MRI without and with contrast is recommended. There is gallbladder wall edema. Pancreas: Unremarkable. No pancreatic ductal dilatation or surrounding inflammatory changes. Spleen: Small splenic calcification. Adrenals/Urinary Tract: The adrenal glands unremarkable. There is no hydronephrosis or nephrolithiasis on either side. Focus of cortical calcification in the inferior pole of the right kidney. Several small left renal cysts and additional bilateral hypodense lesions which are too small to characterize. The kidneys are mildly atrophic. The visualized ureters and urinary bladder appear unremarkable. Stomach/Bowel: There is moderate stool throughout the colon. There is no bowel obstruction or active inflammation. The appendix is normal. No evidence of active GI bleed. Lymphatic: No adenopathy. Reproductive: The uterus and ovaries are grossly unremarkable. Other: None Musculoskeletal: Osteopenia with degenerative changes of the spine. There is extensive erosive changes with endplate irregularity, fragmentation, and sclerotic changes and associated soft tissue thickening involving L4-L5 most consistent with spondylo discitis. Further evaluation with MRI without and with contrast is recommended. IMPRESSION: 1. No evidence of active GI bleed. 2. Cholelithiasis with gallbladder wall edema. If there is clinical concern for acute cholecystitis, further evaluation with ultrasound or HIDA scan recommended. 3. Masslike thickening of the gallbladder fundus may represent advanced adenomyomatosis versus neoplasm. Further evaluation with MRI without and with contrast is recommended. 4. Findings  of spondylo discitis at L4-L5. Further evaluation with lumbar spine MRI without and with contrast is recommended. 5. There is a 9 mm right middle lobe nodule.  Follow-up as per recommendation of prior CT. 6. Aortic Atherosclerosis (ICD10-I70.0). Electronically Signed   By: Anner Crete M.D.   On: 01/16/2022 20:58    Medications:  cefTRIAXone (ROCEPHIN)  IV Stopped (01/17/22 1146)   [START ON 01/19/2022] vancomycin      sodium chloride   Intravenous Once   allopurinol  200 mg Oral Daily   amLODipine  10 mg Oral Daily   bisacodyl  10 mg Oral BID   Chlorhexidine Gluconate Cloth  6 each Topical Daily   cyclobenzaprine  2.5 mg Oral Daily   gabapentin  100 mg Oral QHS   hydrALAZINE  50 mg Oral Q8H   metoprolol tartrate  100 mg Oral BID   metroNIDAZOLE  500 mg Oral Q12H   pantoprazole (PROTONIX) IV  40 mg Intravenous Q12H   senna-docusate  2 tablet Oral BID   sevelamer carbonate  1,600 mg Oral TID WC    Dialysis Orders: Unit: [facility] TTS  Time: 4:00  EDW: 62 kg  Flows: 400  Bath: 2K/2Ca  Access: TDC/Maturing AVF  Heparin: Bolus 2000  ESA: Mircera 50  q 2 wks (last 01/07/22) VDRA: Hectorol 3 TIW    Assessment/Plan: ESRD -  HD TTS. Continue on schedule. Next HD 2/14.  Abd pain --Cholelithiasis/Gallbladder mass on MRI/MRCP. On IV Rocephin/Flagyl.  GI consulting - HIDA scan with no evidence acute cholecystitis and decreased GB EF 21%. Gen surgery consulted - no plans for surgery at this time per patient request.  GIB - GI following. Plan for EGD/colonoscopy tomorrow. Hypertension/volume  - Blood pressure elevated.  Continue home meds. BP well controlled as outpatient. No volume excess on exam.  Acute on chronic Anemia of CKD. Suspected GIB. Hgb 7.9 GI following.  Transfuse prn. Next ESA due 2/16.  Metabolic bone disease - Corrected calcium elevated.  Use low ca bath.  Hold VDRA.  Check phos.  MRSA bacteremia/discitis/endocarditis - prior to admission. On vancomycin q HD until 02/13/22. Subtherapeutic vanc level at outpatient dialysis on 2/7 --Increased Vanc to 750mg  q HD.  Atrial flutter - Eliquis on hold. Right pulmonary nodule Nutrition - CL  diet currently   Jen Mow, PA-C [facility] 01/18/2022,10:41 AM  LOS: 1 day   I have personally seen and examined this patient and agree with the assessment/plan as outlined below by Jen Mow PA. Bailyn Spackman,MD 01/18/2022 2:45 PM

## 2022-01-18 NOTE — H&P (View-Only) (Signed)
Attending physician's note   I have taken a history, reviewed the chart, and examined the patient. I performed a substantive portion of this encounter, including complete performance of at least one of the key components, in conjunction with the APP. I agree with the APP's note, impression, and recommendations with my edits.   Patient is scheduled for EGD/colonoscopy today, but refused prep overnight and was still having solid stools.  She is now refusing to drink any more prep and refusing any endoscopic procedures, to include EGD/colonoscopy.    HIDA with GB EF 21% but otherwise patent cystic duct without evidence of acute cholecystitis.  No plan for urgent lap chole per surgical service.  GI service will sign off at this time.  Please contact us if she is willing to move forward with EGD/colonoscopy for diagnostic and potentially therapeutic intent.   EDIT: Received a page from the primary Hospitalist that the patient is now agreeable to doing procedures.  Will place orders for bowel prep now with possible EGD/colonoscopy depending on availability in the endoscopy unit.  Continue clears with n.p.o. (except bowel prep) at midnight.  Please encourage patient that she needs to drink the entire bowel preparation as ordered.  [place], DO, FACG [phone] office          Progress Note   Subjective  Chief Complaint: Acute on Chronic anemia, Generalized abdominal pain  Today, patient is found laying in her bed after HIDA scan.  Tells me that she feels drained and "can't do anything else".  It looks like she had drank about 1/4-1/3 of her prep yesterday evening for planned procedures, but tells me she can't do anymore.   Objective   Vital signs in last 24 hours: Temp:  [98.2 F (36.8 C)-99.6 F (37.6 C)] 98.6 F (37 C) (02/13 1229) Pulse Rate:  [80-90] 87 (02/13 1229) Resp:  [16-23] 18 (02/13 1229) BP: (163-256)/(73-128) 163/73 (02/13 1229) SpO2:  [97 %-100 %] 100 %  (02/13 1229) Weight:  [62.2 kg] 62.2 kg (02/12 1948) Last BM Date: 01/18/22 General:    AA female in NAD Heart:  Regular rate and rhythm; no murmurs Lungs: Respirations even and unlabored, lungs CTA bilaterally Abdomen:  Soft, Mild left sided ttp and nondistended. Normal bowel sounds. Extremities:  Without edema. Neurologic:  Alert and oriented,  grossly normal neurologically. Psych:  Uncooperative  Intake/Output from previous day: 02/12 0701 - 02/13 0700 In: 1600 [P.O.:1500; IV Piggyback:100] Out: 250 [Urine:250]  Lab Results: Recent Labs    01/17/22 0631 01/17/22 1449 01/18/22 0345  WBC 4.1 4.6 4.3  HGB 7.9* 7.8* 7.6*  HCT 25.2* 24.1* 23.6*  PLT 234   235 237 234   BMET Recent Labs    01/16/22 1550 01/17/22 0426 01/18/22 0345  NA 137 136 135  K 3.1* 3.2* 4.4  CL 95* 96* 96*  CO2 30 30 28   GLUCOSE 84 88 91  BUN 9 12 20   CREATININE 2.26* 3.33* 4.63*  CALCIUM 8.5* 8.8* 10.0   LFT Recent Labs    01/18/22 0345  PROT 6.3*  ALBUMIN 2.0*  AST 15  ALT 9  ALKPHOS 92  BILITOT 0.5   PT/INR Recent Labs    01/16/22 1605 01/17/22 0631  LABPROT 15.9* 14.9  INR 1.3* 1.2    Studies/Results: MR ABDOMEN MRCP WO CONTRAST  Result Date: 01/17/2022 CLINICAL DATA:  71 year old female with history of abdominal pain and back pain. Possible cholecystitis noted on recent CT examination, in addition  to mass-like thickening of the gallbladder fundus. Further evaluation. EXAM: MRI ABDOMEN WITHOUT CONTRAST  (INCLUDING MRCP) TECHNIQUE: Multiplanar multisequence MR imaging of the abdomen was performed. Heavily T2-weighted images of the biliary and pancreatic ducts were obtained, and three-dimensional MRCP images were rendered by post processing. COMPARISON:  No prior abdominal MRI. CTA of the abdomen and pelvis 01/16/2022. FINDINGS: Comment: Today's study is limited for detection and characterization of visceral and/or vascular lesions by lack of IV gadolinium. Lower chest:  Unremarkable. Hepatobiliary: No definite suspicious cystic or solid hepatic lesions are confidently identified on today's noncontrast examination. There is diffuse low signal intensity throughout the hepatic parenchyma on in phase dual echo images, as well as on T2 weighted sequences, suggesting substantial hepatic iron deposition. No intrahepatic biliary ductal dilatation. Common bile duct is mildly dilated measuring 9 mm in the porta hepatis. No filling defect within the common bile duct to suggest choledocholithiasis. There are numerous filling defects within the gallbladder, largest of which appears impacted in the neck of the gallbladder measuring up to 2.8 cm in diameter. Associated with this there is diffuse gallbladder wall thickening and edema, along with a trace amount of pericholecystic fluid. Notably, there is mass-like thickening of the fundus of the gallbladder (axial image 22 of series 4 and coronal image 23 of series 5) which is heterogeneous in signal intensity on T1 and T2 weighted images and demonstrates internal areas of diffusion restriction, concerning for gallbladder neoplasm (poorly evaluated on today's noncontrast examination). Pancreas: No definite pancreatic mass confidently identified on today's noncontrast examination. Diffuse pancreatic ductal dilatation is noted, with the pancreatic duct measuring up to 5 mm in the body of the pancreas. No peripancreatic fluid collections or inflammatory changes. Spleen: Diffuse low signal intensity throughout the splenic parenchyma on in phase dual echo images and T2 weighted sequences, indicative of splenic iron deposition. Adrenals/Urinary Tract: Numerous T1 hypointense, T2 hyperintense lesions are noted in the kidneys bilaterally, incompletely characterized on today's noncontrast examination, but likely to represent cysts, largest of which is exophytic measuring up to 2.6 cm in the anterolateral aspect of the lower pole of the left kidney. No  hydroureteronephrosis in the visualized portions of the abdomen. Bilateral adrenal glands are normal in appearance. Stomach/Bowel: Visualized portions are unremarkable. Vascular/Lymphatic: No aneurysm identified in the visualized abdominal vasculature. Borderline enlarged portacaval lymph node measuring 1 cm in short axis (axial image 17 of series 4) and hepatoduodenal ligament lymph node (axial image 14 of series 4) measuring 1.2 cm in short axis. Other: No significant volume of ascites noted in the visualized portions of the peritoneal cavity. Small fat containing umbilical hernia. Musculoskeletal: No aggressive appearing osseous lesions are noted in the visualized portions of the skeleton. IMPRESSION: 1. Cholelithiasis with evidence suggestive of acute cholecystitis. 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 Prominent borderline enlarged portacaval and hepatoduodenal ligament lymph nodes are noted. Attention to these at time of cholecystectomy is recommended, as the possibility of metastatic disease is not excluded. 2. Hepatic and splenic hemosiderosis. 3. Dilatation of the common bile duct without intrahepatic biliary ductal dilatation. This could be benign and age-related. No obstructing choledocholithiasis identified on today's examination. Additionally, there is no definite obstructing lesion in the pancreatic head or region of the ampulla. Notably, however, there is also some mild diffuse pancreatic ductal dilatation. If there is clinical concern for subtle neoplasm in the region of the pancreatic head or ampulla, repeat  abdominal MRI with and without IV gadolinium with MRCP could be considered. Please note, however, that per report from the technologist, the patient refused to follow commands and refused to finish today's examination. Electronically Signed   By: Vinnie Langton M.D.   On: 01/17/2022  06:13   NM Hepato W/EF  Result Date: 01/18/2022 CLINICAL DATA:  Recurrent biliary colic EXAM: NUCLEAR MEDICINE HEPATOBILIARY IMAGING WITH GALLBLADDER EF TECHNIQUE: Sequential images of the abdomen were obtained out to 60 minutes following intravenous administration of radiopharmaceutical. After oral ingestion of Ensure, gallbladder ejection fraction was determined. At 60 min, normal ejection fraction is greater than 33%. RADIOPHARMACEUTICALS:  5.4 mCi Tc-41m  Choletec IV COMPARISON:  MRCP 01/17/2022 FINDINGS: Prompt uptake and biliary excretion of activity by the liver is seen. Gallbladder activity is visualized, consistent with patency of cystic duct. Biliary activity passes into small bowel, consistent with patent common bile duct. Calculated gallbladder ejection fraction is 21 %. (Normal gallbladder ejection fraction with Ensure is greater than 33%.) IMPRESSION: 1. Patent cystic duct without evidence for acute cholecystitis. 2. Decreased gallbladder ejection fraction at 21% which may reflect underlying biliary dyskinesis. Electronically Signed   By: Kerby Moors M.D.   On: 01/18/2022 12:35   DG Chest Port 1 View  Result Date: 01/16/2022 CLINICAL DATA:  Sepsis EXAM: PORTABLE CHEST 1 VIEW COMPARISON:  12/26/2021 FINDINGS: Stable positioning of right IJ central line. Stable cardiomediastinal contours. No focal airspace consolidation, pleural effusion, or pneumothorax. IMPRESSION: No active disease. Electronically Signed   By: Davina Poke D.O.   On: 01/16/2022 17:24   CT Angio Abd/Pel W and/or Wo Contrast  Result Date: 01/16/2022 CLINICAL DATA:  Lower GI bleed. EXAM: CTA ABDOMEN AND PELVIS WITHOUT AND WITH CONTRAST TECHNIQUE: Multidetector CT imaging of the abdomen and pelvis was performed using the standard protocol during bolus administration of intravenous contrast. Multiplanar reconstructed images and MIPs were obtained and reviewed to evaluate the vascular anatomy. RADIATION DOSE REDUCTION:  This exam was performed according to the departmental dose-optimization program which includes automated exposure control, adjustment of the mA and/or kV according to patient size and/or use of iterative reconstruction technique. CONTRAST:  34mL OMNIPAQUE IOHEXOL 350 MG/ML SOLN COMPARISON:  CT abdomen pelvis dated 12/17/2021. FINDINGS: VASCULAR Aorta: Mild atherosclerotic calcification. No aneurysmal dilatation or dissection. The aorta is tortuous. Celiac: Patent without evidence of aneurysm, dissection, vasculitis or significant stenosis. SMA: Patent without evidence of aneurysm, dissection, vasculitis or significant stenosis. Renals: Both renal arteries are patent without evidence of aneurysm, dissection, vasculitis, fibromuscular dysplasia or significant stenosis. IMA: Patent without evidence of aneurysm, dissection, vasculitis or significant stenosis. Inflow: Mild atherosclerotic calcification. No aneurysmal dilatation or dissection. The iliac arteries are patent. Proximal Outflow: Bilateral common femoral and visualized portions of the superficial and profunda femoral arteries are patent without evidence of aneurysm, dissection, vasculitis or significant stenosis. Veins: The IVC is unremarkable.  No portal venous gas. Review of the MIP images confirms the above findings. NON-VASCULAR Lower chest: There is a 9 mm right middle lobe nodule. Follow-up as per recommendation of CT of 12/17/2021. The visualized lung bases are otherwise clear. No intra-abdominal free air or free fluid. Hepatobiliary: The liver is unremarkable. No intrahepatic biliary dilatation. Several noncalcified gallstones measure up to 2.7 cm. There is a 3.1 x 3.4 cm masslike thickening of the gallbladder fundus. Although this may represent adenomyomatosis, gallbladder malignancy is not excluded. Further characterization with MRI without and with contrast is recommended. There is gallbladder wall edema. Pancreas: Unremarkable. No pancreatic ductal  dilatation or surrounding inflammatory changes. Spleen: Small splenic calcification. Adrenals/Urinary Tract: The adrenal glands unremarkable. There is no hydronephrosis or nephrolithiasis on either side. Focus of cortical calcification in the inferior pole of the right kidney. Several small left renal cysts and additional bilateral hypodense lesions which are too small to characterize. The kidneys are mildly atrophic. The visualized ureters and urinary bladder appear unremarkable. Stomach/Bowel: There is moderate stool throughout the colon. There is no bowel obstruction or active inflammation. The appendix is normal. No evidence of active GI bleed. Lymphatic: No adenopathy. Reproductive: The uterus and ovaries are grossly unremarkable. Other: None Musculoskeletal: Osteopenia with degenerative changes of the spine. There is extensive erosive changes with endplate irregularity, fragmentation, and sclerotic changes and associated soft tissue thickening involving L4-L5 most consistent with spondylo discitis. Further evaluation with MRI without and with contrast is recommended. IMPRESSION: 1. No evidence of active GI bleed. 2. Cholelithiasis with gallbladder wall edema. If there is clinical concern for acute cholecystitis, further evaluation with ultrasound or HIDA scan recommended. 3. Masslike thickening of the gallbladder fundus may represent advanced adenomyomatosis versus neoplasm. Further evaluation with MRI without and with contrast is recommended. 4. 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 Further evaluation with lumbar spine MRI without and with contrast is recommended. 5. There is a 9 mm right middle lobe nodule. Follow-up as per recommendation of prior CT. 6. Aortic Atherosclerosis (ICD10-I70.0). Electronically Signed   By: Anner Crete M.D.   On: 01/16/2022 20:58     Assessment / Plan:   Assessment: Acute on Chronic Anemia: hgb 8.1 (10.6 two weeks ago)-->7.6, heme + stool, no overt bleeding, plans were  for EGD/Colonoscopy but patient unable to finish bowel prep and now refusing procedures Generalized abdominal pain:Abd/pelvic CT showed cholelithiasis with a masslike thickening of the gallbladder fundus.  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  CBD dilatation without intrahepatic biliary ductal dilatation.  Mild diffuse pancreatic ductal dilatation was noted without an obvious pancreatic mass.  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  Went to see patient today and she tells me she just "can't do anymore procedures".  She is now refusing EGD or Colonoscopy. Appreciate surgical plans going forward Would recommend continued monitoring of hgb going forward and blood transfusion/ iron infusion as needed We will sign off for now as patient declining GI workup.  If she is willing to pursue in the future please call us back  Thank you for your kind consultation, we will sign off.   LOS: 1 day   Levin Erp  01/18/2022, 2:36 PM

## 2022-01-19 ENCOUNTER — Encounter (HOSPITAL_COMMUNITY): Admission: EM | Disposition: A | Discharge status: Home Health | Payer: Self-pay | Attending: Internal Medicine

## 2022-01-19 ENCOUNTER — Encounter (HOSPITAL_COMMUNITY): Payer: Self-pay | Admitting: Internal Medicine

## 2022-01-19 ENCOUNTER — Inpatient Hospital Stay (HOSPITAL_COMMUNITY): Payer: Medicare HMO | Admitting: Anesthesiology

## 2022-01-19 DIAGNOSIS — K2289 Other specified disease of esophagus: Secondary | ICD-10-CM

## 2022-01-19 DIAGNOSIS — K3189 Other diseases of stomach and duodenum: Secondary | ICD-10-CM

## 2022-01-19 DIAGNOSIS — K2980 Duodenitis without bleeding: Secondary | ICD-10-CM

## 2022-01-19 DIAGNOSIS — R1084 Generalized abdominal pain: Secondary | ICD-10-CM

## 2022-01-19 DIAGNOSIS — K635 Polyp of colon: Secondary | ICD-10-CM

## 2022-01-19 DIAGNOSIS — B3781 Candidal esophagitis: Secondary | ICD-10-CM

## 2022-01-19 DIAGNOSIS — R195 Other fecal abnormalities: Secondary | ICD-10-CM

## 2022-01-19 DIAGNOSIS — I330 Acute and subacute infective endocarditis: Secondary | ICD-10-CM

## 2022-01-19 DIAGNOSIS — D62 Acute posthemorrhagic anemia: Secondary | ICD-10-CM

## 2022-01-19 HISTORY — PX: BIOPSY: SHX5522

## 2022-01-19 HISTORY — PX: POLYPECTOMY: SHX5525

## 2022-01-19 HISTORY — PX: ESOPHAGOGASTRODUODENOSCOPY: SHX5428

## 2022-01-19 HISTORY — PX: COLONOSCOPY: SHX5424

## 2022-01-19 LAB — CBC WITH DIFFERENTIAL/PLATELET
Abs Immature Granulocytes: 0.02 10*3/uL (ref 0.00–0.07)
Basophils Absolute: 0 10*3/uL (ref 0.0–0.1)
Basophils Relative: 1 %
Eosinophils Absolute: 0.3 10*3/uL (ref 0.0–0.5)
Eosinophils Relative: 7 %
HCT: 22.4 % — ABNORMAL LOW (ref 36.0–46.0)
Hemoglobin: 7.3 g/dL — ABNORMAL LOW (ref 12.0–15.0)
Immature Granulocytes: 1 %
Lymphocytes Relative: 24 %
Lymphs Abs: 0.9 10*3/uL (ref 0.7–4.0)
MCH: 31.1 pg (ref 26.0–34.0)
MCHC: 32.6 g/dL (ref 30.0–36.0)
MCV: 95.3 fL (ref 80.0–100.0)
Monocytes Absolute: 0.4 10*3/uL (ref 0.1–1.0)
Monocytes Relative: 12 %
Neutro Abs: 2.1 10*3/uL (ref 1.7–7.7)
Neutrophils Relative %: 55 %
Platelets: 213 10*3/uL (ref 150–400)
RBC: 2.35 MIL/uL — ABNORMAL LOW (ref 3.87–5.11)
RDW: 15.5 % (ref 11.5–15.5)
WBC: 3.8 10*3/uL — ABNORMAL LOW (ref 4.0–10.5)
nRBC: 0 % (ref 0.0–0.2)

## 2022-01-19 LAB — CBC
HCT: 24.6 % — ABNORMAL LOW (ref 36.0–46.0)
Hemoglobin: 7.9 g/dL — ABNORMAL LOW (ref 12.0–15.0)
MCH: 31 pg (ref 26.0–34.0)
MCHC: 32.1 g/dL (ref 30.0–36.0)
MCV: 96.5 fL (ref 80.0–100.0)
Platelets: 225 10*3/uL (ref 150–400)
RBC: 2.55 MIL/uL — ABNORMAL LOW (ref 3.87–5.11)
RDW: 15.7 % — ABNORMAL HIGH (ref 11.5–15.5)
WBC: 3.7 10*3/uL — ABNORMAL LOW (ref 4.0–10.5)
nRBC: 0 % (ref 0.0–0.2)

## 2022-01-19 LAB — RENAL FUNCTION PANEL
Albumin: 2.3 g/dL — ABNORMAL LOW (ref 3.5–5.0)
Anion gap: 13 (ref 5–15)
BUN: 30 mg/dL — ABNORMAL HIGH (ref 8–23)
CO2: 25 mmol/L (ref 22–32)
Calcium: 9.8 mg/dL (ref 8.9–10.3)
Chloride: 97 mmol/L — ABNORMAL LOW (ref 98–111)
Creatinine, Ser: 6.21 mg/dL — ABNORMAL HIGH (ref 0.44–1.00)
GFR, Estimated: 7 mL/min — ABNORMAL LOW (ref >60)
Glucose, Bld: 92 mg/dL (ref 70–99)
Phosphorus: 5.6 mg/dL — ABNORMAL HIGH (ref 2.5–4.6)
Potassium: 4 mmol/L (ref 3.5–5.1)
Sodium: 135 mmol/L (ref 135–145)

## 2022-01-19 LAB — COMPREHENSIVE METABOLIC PANEL
ALT: 8 U/L (ref 0–44)
AST: 13 U/L — ABNORMAL LOW (ref 15–41)
Albumin: 2.1 g/dL — ABNORMAL LOW (ref 3.5–5.0)
Alkaline Phosphatase: 82 U/L (ref 38–126)
Anion gap: 13 (ref 5–15)
BUN: 29 mg/dL — ABNORMAL HIGH (ref 8–23)
CO2: 25 mmol/L (ref 22–32)
Calcium: 10.2 mg/dL (ref 8.9–10.3)
Chloride: 97 mmol/L — ABNORMAL LOW (ref 98–111)
Creatinine, Ser: 5.96 mg/dL — ABNORMAL HIGH (ref 0.44–1.00)
GFR, Estimated: 7 mL/min — ABNORMAL LOW (ref >60)
Glucose, Bld: 90 mg/dL (ref 70–99)
Potassium: 4 mmol/L (ref 3.5–5.1)
Sodium: 135 mmol/L (ref 135–145)
Total Bilirubin: 0.5 mg/dL (ref 0.3–1.2)
Total Protein: 6.2 g/dL — ABNORMAL LOW (ref 6.5–8.1)

## 2022-01-19 LAB — HEPATITIS B SURFACE ANTIGEN: Hepatitis B Surface Ag: NONREACTIVE

## 2022-01-19 LAB — CANCER ANTIGEN 19-9: CA 19-9: <2 U/mL (ref 0–35)

## 2022-01-19 LAB — HEPATITIS B SURFACE ANTIBODY,QUALITATIVE: Hep B S Ab: NONREACTIVE

## 2022-01-19 LAB — HAPTOGLOBIN: Haptoglobin: 272 mg/dL (ref 42–346)

## 2022-01-19 LAB — C-REACTIVE PROTEIN: CRP: 9.6 mg/dL — ABNORMAL HIGH (ref <1.0)

## 2022-01-19 LAB — PHOSPHORUS: Phosphorus: 5.5 mg/dL — ABNORMAL HIGH (ref 2.5–4.6)

## 2022-01-19 LAB — BRAIN NATRIURETIC PEPTIDE: B Natriuretic Peptide: 1446.7 pg/mL — ABNORMAL HIGH (ref 0.0–100.0)

## 2022-01-19 LAB — MAGNESIUM: Magnesium: 2 mg/dL (ref 1.7–2.4)

## 2022-01-19 SURGERY — CANCELLED PROCEDURE

## 2022-01-19 SURGERY — EGD (ESOPHAGOGASTRODUODENOSCOPY)
Anesthesia: Monitor Anesthesia Care | Laterality: Left

## 2022-01-19 MED ORDER — PHENYLEPHRINE 40 MCG/ML (10ML) SYRINGE FOR IV PUSH (FOR BLOOD PRESSURE SUPPORT)
PREFILLED_SYRINGE | INTRAVENOUS | Status: DC | PRN
  Administered 2022-01-19: 80 ug via INTRAVENOUS
  Administered 2022-01-19: 120 ug via INTRAVENOUS

## 2022-01-19 MED ORDER — HEPARIN SODIUM (PORCINE) 1000 UNIT/ML DIALYSIS
1000.0000 [IU] | INTRAMUSCULAR | Status: DC | PRN
  Filled 2022-01-19: qty 1

## 2022-01-19 MED ORDER — FLUCONAZOLE 100 MG PO TABS
200.0000 mg | ORAL_TABLET | Freq: Once | ORAL | Status: AC
  Administered 2022-01-19: 200 mg via ORAL
  Filled 2022-01-19: qty 2

## 2022-01-19 MED ORDER — FLUCONAZOLE 100 MG PO TABS
100.0000 mg | ORAL_TABLET | Freq: Every day | ORAL | Status: DC
  Administered 2022-01-20 – 2022-01-22 (×3): 100 mg via ORAL
  Filled 2022-01-19 (×3): qty 1

## 2022-01-19 MED ORDER — PROPOFOL 10 MG/ML IV BOLUS
INTRAVENOUS | Status: DC | PRN
  Administered 2022-01-19 (×2): 20 mg via INTRAVENOUS

## 2022-01-19 MED ORDER — PANTOPRAZOLE SODIUM 40 MG PO TBEC
40.0000 mg | DELAYED_RELEASE_TABLET | Freq: Two times a day (BID) | ORAL | Status: DC
  Administered 2022-01-19 – 2022-01-22 (×6): 40 mg via ORAL
  Filled 2022-01-19 (×6): qty 1

## 2022-01-19 MED ORDER — SODIUM CHLORIDE 0.9 % IV SOLN: INTRAVENOUS | Status: DC

## 2022-01-19 MED ORDER — FLUCONAZOLE 100 MG PO TABS: 100.0000 mg | ORAL_TABLET | Freq: Every day | ORAL | Status: DC

## 2022-01-19 MED ORDER — PROPOFOL 500 MG/50ML IV EMUL
INTRAVENOUS | Status: DC | PRN
  Administered 2022-01-19: 125 ug/kg/min via INTRAVENOUS

## 2022-01-19 MED ORDER — PENTAFLUOROPROP-TETRAFLUOROETH EX AERO: 1.0000 "application " | INHALATION_SPRAY | CUTANEOUS | Status: DC | PRN

## 2022-01-19 MED ORDER — PEG-KCL-NACL-NASULF-NA ASC-C 100 G PO SOLR: 1.0000 | Freq: Once | ORAL | Status: DC

## 2022-01-19 MED ORDER — MUPIROCIN 2 % EX OINT
1.0000 "application " | TOPICAL_OINTMENT | Freq: Two times a day (BID) | CUTANEOUS | Status: DC
  Administered 2022-01-19 – 2022-01-21 (×4): 1 via NASAL
  Filled 2022-01-19 (×3): qty 22

## 2022-01-19 MED ORDER — SODIUM CHLORIDE 0.9 % IV SOLN: 100.0000 mL | INTRAVENOUS | Status: DC | PRN

## 2022-01-19 MED ORDER — LIDOCAINE-PRILOCAINE 2.5-2.5 % EX CREA
1.0000 "application " | TOPICAL_CREAM | CUTANEOUS | Status: DC | PRN
  Filled 2022-01-19: qty 5

## 2022-01-19 MED ORDER — LIDOCAINE HCL (PF) 1 % IJ SOLN: 5.0000 mL | INTRAMUSCULAR | Status: DC | PRN

## 2022-01-19 MED ORDER — ALTEPLASE 2 MG IJ SOLR: 2.0000 mg | Freq: Once | INTRAMUSCULAR | Status: DC | PRN

## 2022-01-19 MED ORDER — SODIUM CHLORIDE 0.9 % IV SOLN: INTRAVENOUS | Status: DC | PRN

## 2022-01-19 NOTE — Anesthesia Preprocedure Evaluation (Signed)
Anesthesia Evaluation  Patient identified by MRN, date of birth, ID band Patient awake    Reviewed: Allergy & Precautions, NPO status , Patient's Chart, lab work & pertinent test results  History of Anesthesia Complications Negative for: history of anesthetic complications  Airway Mallampati: III  TM Distance: >3 FB Neck ROM: Full    Dental  (+) Edentulous Upper, Dental Advisory Given   Pulmonary neg shortness of breath, asthma , neg sleep apnea, neg COPD, neg recent URI,    breath sounds clear to auscultation       Cardiovascular hypertension, Pt. on medications and Pt. on home beta blockers  Rhythm:Regular  1. There is a 2.1 x 1.37 mutilobulated mobile mass attached to the RA/IVC  junction which has the appearance of thrombus/vegetation in the setting of  bacteremia and recently removed dialysis catheter.  2. Left ventricular ejection fraction, by estimation, is 60 to 65%. The  left ventricle has normal function. The left ventricle has no regional  wall motion abnormalities.  3. Right ventricular systolic function is normal. The right ventricular  size is normal.  4. No left atrial/left atrial appendage thrombus was detected.  5. No mitral valve vegetation. The mitral valve is degenerative. Trivial  mitral valve regurgitation. No evidence of mitral stenosis.  6. The aortic valve is normal in structure. Aortic valve regurgitation is  not visualized. No aortic stenosis is present.  7. The inferior vena cava is normal in size with greater than 50%  respiratory variability, suggesting right atrial pressure of 3 mmHg.    Neuro/Psych negative neurological ROS  negative psych ROS   GI/Hepatic Neg liver ROS, ? GI bleed   Endo/Other  negative endocrine ROSLab Results      Component                Value               Date                      HGBA1C                   5.4                 10/18/2019              Renal/GU ESRF and DialysisRenal diseaseLab Results      Component                Value               Date                      CREATININE               5.96 (H)            01/19/2022           Lab Results      Component                Value               Date                      K                        4.0  01/19/2022                Musculoskeletal  (+) Arthritis ,   Abdominal   Peds  Hematology  (+) Blood dyscrasia, anemia , Lab Results      Component                Value               Date                      WBC                      3.8 (L)             01/19/2022                HGB                      7.3 (L)             01/19/2022                HCT                      22.4 (L)            01/19/2022                MCV                      95.3                01/19/2022                PLT                      213                 01/19/2022              Anesthesia Other Findings   Reproductive/Obstetrics                             Anesthesia Physical Anesthesia Plan  ASA: 3  Anesthesia Plan: MAC   Post-op Pain Management: Minimal or no pain anticipated   Induction: Intravenous  PONV Risk Score and Plan: 2 and Propofol infusion and Treatment may vary due to age or medical condition  Airway Management Planned: Nasal Cannula  Additional Equipment: None  Intra-op Plan:   Post-operative Plan:   Informed Consent: I have reviewed the patients History and Physical, chart, labs and discussed the procedure including the risks, benefits and alternatives for the proposed anesthesia with the patient or authorized representative who has indicated his/her understanding and acceptance.     Dental advisory given  Plan Discussed with: CRNA and Anesthesiologist  Anesthesia Plan Comments:         Anesthesia Quick Evaluation

## 2022-01-19 NOTE — Progress Notes (Signed)
PHARMACY CONSULT NOTE FOR:  OUTPATIENT  PARENTERAL ANTIBIOTIC THERAPY (OPAT)  Indication: MRSA discitis/endocarditis Regimen: Vancomycin 750 mg post HD sessions (current schedule TTS) End date: 02/13/22  IV antibiotic discharge orders are pended. To discharging provider:  please sign these orders via discharge navigator,  Select New Orders & click on the button choice - Manage This Unsigned Work.   Informational only - patient re-admitted with existing OPAT. Last seen by ID on 12/23/21. Plan at that time was for Vancomycin with HD thru 02/13/22. Patient to receive antibiotics with HD. Follow-up visit at the ID clinic planned for 02/03/22 at 11:15 with Dr. Gale Journey.   Thank you for allowing pharmacy to be a part of this patients care.  Alycia Rossetti, PharmD, BCPS Clinical Pharmacist 01/19/2022 2:12 PM   **Pharmacist phone directory can now be found on [email or web address] ([email or web address]).  Listed under [place].

## 2022-01-19 NOTE — Op Note (Signed)
[facility] Patient Name: Diamond [name] Procedure Date : 01/19/2022 MRN: [id number] Attending MD: Gerrit Heck , MD Date of Birth: 12-Feb-1951 CSN: [id number] Age: 71 Admit Type: Inpatient Procedure:                Colonoscopy Indications:              Generalized abdominal pain, Heme positive stool,                            Acute on chronic anemia. Iron panel with features                            of anemia of chronic disease with elevated ferritin                            likely acute phase reactant. Normal B12. Providers:                Gerrit Heck, MD, Ervin Knack, RN, Frazier Richards, Technician Referring MD:              Medicines:                Monitored Anesthesia Care Complications:            No immediate complications. Estimated Blood Loss:     Estimated blood loss was minimal. Procedure:                Pre-Anesthesia Assessment:                           - Prior to the procedure, a History and Physical                            was performed, and patient medications and                            allergies were reviewed. The patient's tolerance of                            previous anesthesia was also reviewed. The risks                            and benefits of the procedure and the sedation                            options and risks were discussed with the patient.                            All questions were answered, and informed consent                            was obtained. Prior Anticoagulants: The patient has  taken Eliquis (apixaban), last dose was 3 days                            prior to procedure. ASA Grade Assessment: III - A                            patient with severe systemic disease. After                            reviewing the risks and benefits, the patient was                            deemed in satisfactory condition to undergo the                             procedure.                           After obtaining informed consent, the colonoscope                            was passed under direct vision. Throughout the                            procedure, the patient's blood pressure, pulse, and                            oxygen saturations were monitored continuously. The                            CF-HQ190L ([id number] Olympus coloscope was                            introduced through the anus and advanced to the the                            cecum, identified by appendiceal orifice and                            ileocecal valve. The colonoscopy was performed                            without difficulty. The patient tolerated the                            procedure well. The quality of the bowel                            preparation was good. The ileocecal valve,                            appendiceal orifice, and rectum were photographed. Scope In: 12:38:48 PM Scope Out: 12:55:15 PM Scope Withdrawal Time: 0 hours 11 minutes 48 seconds  Total Procedure Duration: 0 hours 16 minutes  27 seconds  Findings:      Skin tags were found on perianal exam.      Two sessile polyps were found in the sigmoid colon. The polyps were 3 to       5 mm in size. These polyps were removed with a cold snare. Resection and       retrieval were complete. Estimated blood loss was minimal.      Normal mucosa was found in the entire colon. No areas of mucosal       erythema, edema, erosions, or ulceration. No blood in the GI lumen.      Retroflexion in the rectum was not performed due to anatomy (narrowed       rectal vault). Anterograde views were otherwise normal appearing. Impression:               - Perianal skin tags found on perianal exam.                           - Two 3 to 5 mm polyps in the sigmoid colon,                            removed with a cold snare. Resected and retrieved.                           - Normal mucosa in the entire examined  colon. Recommendation:           - Return patient to hospital ward for ongoing care.                           - Continue present medications.                           - Await pathology results.                           - Repeat colonoscopy for surveillance based on                            pathology results.                           - Unless planning on surgical procedure, ok from GI                            standpoint to resume Eliquis (apixaban) at prior                            dose tomorrow.                           - Continue daily CBC checks with blood products as                            needed per protocol.                           - Management of GB lesion noted  on imaging per                            consulting Surgical service.                           - GI service will remain available as needed. Procedure Code(s):        --- Professional ---                           [phone], Colonoscopy, flexible; with removal of                            tumor(s), polyp(s), or other lesion(s) by snare                            technique Diagnosis Code(s):        --- Professional ---                           K63.5, Polyp of colon                           K64.4, Residual hemorrhoidal skin tags                           R10.84, Generalized abdominal pain                           R19.5, Other fecal abnormalities                           D62, Acute posthemorrhagic anemia CPT copyright 2019 [employer]. All rights reserved. The codes documented in this report are preliminary and upon coder review may  be revised to meet current compliance requirements. Gerrit Heck, MD 01/19/2022 1:09:56 PM Number of Addenda: 0

## 2022-01-19 NOTE — Progress Notes (Signed)
[facility] Progress Note   Subjective:   Patient seen and examined at bedside in room.  Waiting to go for EGD.  Denies CP, SOB, abdominal pain and n/v/d.  Only complaint is hungry.   Objective Vitals:   01/18/22 1658 01/18/22 2055 01/19/22 0509 01/19/22 0906  BP: (!) 155/94 (!) 150/87 (!) 147/79 (!) 145/91  Pulse: 83 87 87 84  Resp: 17 16 18 18   Temp: 98.1 F (36.7 C) 98.5 F (36.9 C) 98.7 F (37.1 C) 98.7 F (37.1 C)  TempSrc:  Oral Oral Oral  SpO2: 97% 100% 97% 97%  Weight:      Height:       Physical Exam General:well appearing female in NAD Heart:RRR, no mrg Lungs:CTAB, nml WOB on RA Abdomen:soft, NTND Extremities:no LE edema Dialysis Access: TDC, maturing LU AVF +b   Filed Weights   01/16/22 1515 01/17/22 1948  Weight: 65 kg 62.2 kg    Intake/Output Summary (Last 24 hours) at 01/19/2022 1021 Last data filed at 01/19/2022 0858 Gross per 24 hour  Intake 1820 ml  Output 250 ml  Net 1570 ml    Additional Objective Labs: Basic Metabolic Panel: Recent Labs  Lab 01/17/22 0426 01/18/22 0345 01/19/22 0720  NA 136 135 135  K 3.2* 4.4 4.0  CL 96* 96* 97*  CO2 30 28 25   GLUCOSE 88 91 90  BUN 12 20 29*  CREATININE 3.33* 4.63* 5.96*  CALCIUM 8.8* 10.0 10.2   Liver Function Tests: Recent Labs  Lab 01/17/22 0426 01/18/22 0345 01/19/22 0720  AST 13* 15 13*  ALT 9 9 8   ALKPHOS 100 92 82  BILITOT 0.5 0.5 0.5  PROT 5.8* 6.3* 6.2*  ALBUMIN 2.0* 2.0* 2.1*   Recent Labs  Lab 01/16/22 1550  LIPASE 42   CBC: Recent Labs  Lab 01/16/22 1550 01/17/22 0426 01/17/22 0631 01/17/22 1449 01/18/22 0345 01/19/22 0720  WBC 4.3 4.5 4.1 4.6 4.3 3.8*  NEUTROABS 2.9  --   --   --  2.5 2.1  HGB 8.1* 7.0* 7.9* 7.8* 7.6* 7.3*  HCT 26.6* 21.7* 25.2* 24.1* 23.6* 22.4*  MCV 98.5 96.4 96.6 96.4 95.5 95.3  PLT 249 216 234   235 237 234 213   Blood Culture    Component Value Date/Time   SDES BLOOD RIGHT ARM 01/16/2022 1830   SPECREQUEST  01/16/2022  1830    BOTTLES DRAWN AEROBIC ONLY Blood Culture results may not be optimal due to an inadequate volume of blood received in culture bottles   CULT  01/16/2022 1830    NO GROWTH 3 DAYS Performed at [facility], [address] [place], [place] [address]    REPTSTATUS PENDING 01/16/2022 1830    Cardiac Enzymes: No results for input(s): CKTOTAL, CKMB, CKMBINDEX, TROPONINI in the last 168 hours. CBG: Recent Labs  Lab 01/17/22 0045  GLUCAP 94   Iron Studies:  Recent Labs    01/17/22 0629  IRON 38  TIBC 154*  FERRITIN 1,471*   Lab Results  Component Value Date   INR 1.2 01/17/2022   INR 1.3 (H) 01/16/2022   INR 1.0 10/16/2019   Studies/Results: NM Hepato W/EF  Result Date: 01/18/2022 CLINICAL DATA:  Recurrent biliary colic EXAM: NUCLEAR MEDICINE HEPATOBILIARY IMAGING WITH GALLBLADDER EF TECHNIQUE: Sequential images of the abdomen were obtained out to 60 minutes following intravenous administration of radiopharmaceutical. After oral ingestion of Ensure, gallbladder ejection fraction was determined. At 60 min, normal ejection fraction is greater than  33%. RADIOPHARMACEUTICALS:  5.4 mCi Tc-12m  Choletec IV COMPARISON:  MRCP 01/17/2022 FINDINGS: Prompt uptake and biliary excretion of activity by the liver is seen. Gallbladder activity is visualized, consistent with patency of cystic duct. Biliary activity passes into small bowel, consistent with patent common bile duct. Calculated gallbladder ejection fraction is 21 %. (Normal gallbladder ejection fraction with Ensure is greater than 33%.) IMPRESSION: 1. Patent cystic duct without evidence for acute cholecystitis. 2. Decreased gallbladder ejection fraction at 21% which may reflect underlying biliary dyskinesis. Electronically Signed   By: Kerby Moors M.D.   On: 01/18/2022 12:35    Medications:  cefTRIAXone (ROCEPHIN)  IV 2 g (01/18/22 1246)   vancomycin      sodium chloride   Intravenous Once   allopurinol  200 mg Oral Daily    amLODipine  10 mg Oral Daily   bisacodyl  10 mg Oral BID   Chlorhexidine Gluconate Cloth  6 each Topical Daily   Chlorhexidine Gluconate Cloth  6 each Topical Q0600   cyclobenzaprine  2.5 mg Oral Daily   gabapentin  100 mg Oral QHS   hydrALAZINE  50 mg Oral Q8H   metoprolol tartrate  100 mg Oral BID   metroNIDAZOLE  500 mg Oral Q12H   pantoprazole (PROTONIX) IV  40 mg Intravenous Q12H   senna-docusate  2 tablet Oral BID   sevelamer carbonate  1,600 mg Oral TID WC    Dialysis Orders: Unit: [facility] TTS  Time: 4:00  EDW: 62 kg  Flows: 400  Bath: 2K/2Ca  Access: TDC/Maturing AVF  Heparin: Bolus 2000  ESA: Mircera 50  q 2 wks (last 01/07/22) VDRA: Hectorol 3 TIW    Assessment/Plan: ESRD -  HD TTS. Continue on schedule. Next HD today after procedure.  Abd pain --Cholelithiasis/Gallbladder mass on MRI/MRCP. On IV Rocephin/Flagyl.  GI consulting - HIDA scan with no evidence acute cholecystitis and decreased GB EF 21%. Gen surgery consulted - no plans for surgery at this time per patient request.  GIB - GI following. Plan for EGD/colonoscopy today. Hypertension/volume  - Blood pressure elevated.  Continue home meds. BP well controlled as outpatient. No volume excess on exam.  Acute on chronic Anemia of CKD. Suspected GIB. Hgb dropping 7.3 today. GI following.  Transfuse prn. Next ESA due 2/16.  Metabolic bone disease - Corrected calcium elevated.  Use low ca bath.  Hold VDRA.  Check phos.  MRSA bacteremia/discitis/endocarditis - prior to admission. On vancomycin q HD until 02/13/22. Subtherapeutic vanc level at outpatient dialysis on 2/7 --Increased Vanc to 750mg  q HD.  Atrial flutter - Eliquis on hold. Right pulmonary nodule Nutrition - currently NPO.  Renal diet w/fluid restrictions when advanced.   Jen Mow, PA-C [facility] 01/19/2022,10:21 AM  LOS: 2 days

## 2022-01-19 NOTE — Transfer of Care (Signed)
Immediate Anesthesia Transfer of Care Note  Patient: Diamond [name]  Procedure(s) Performed: ESOPHAGOGASTRODUODENOSCOPY (EGD) (Left) COLONOSCOPY (Left) BIOPSY POLYPECTOMY  Patient Location: Endoscopy Unit  Anesthesia Type:MAC  Level of Consciousness: drowsy and patient cooperative  Airway & Oxygen Therapy: Patient Spontanous Breathing  Post-op Assessment: Report given to RN and Post -op Vital signs reviewed and stable  Post vital signs: Reviewed and stable  Last Vitals:  Vitals Value Taken Time  BP 135/65 01/19/22 1304  Temp    Pulse 77 01/19/22 1305  Resp 32 01/19/22 1305  SpO2 100 % 01/19/22 1305  Vitals shown include unvalidated device data.  Last Pain:  Vitals:   01/19/22 1146  TempSrc: Oral  PainSc: 0-No pain      Patients Stated Pain Goal: 0 (69/62/95 2841)  Complications: No notable events documented.

## 2022-01-19 NOTE — Progress Notes (Signed)
PROGRESS NOTE                                                                                                                                                                                                             Patient Demographics:    Diamond [name], is a 71 y.o. 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  In the ER was suggestive of Hemoccult positive stool, acute on chronic anemia along with possible cholecystitis and she was admitted.   Subjective:   Patient in bed, appears comfortable, denies any headache, no fever, no chest pain or pressure, no shortness of breath , no abdominal pain. No new focal weakness.   Assessment  & Plan :    Acute abdominal pain mostly in the right upper quadrant.  Suspicious for acute or subacute cholecystitis - MRCP has been noted with possible cholecystitis and gallbladder mass -  GI and general surgery both were consulted and saw the patient, she was placed on empiric IV Rocephin and Flagyl, she underwent HIDA scan which was unremarkable, EF on HIDA scan though was slightly compromised.  General surgery has signed off and have no further recommendations, will stop antibiotics on 01/19/2022 and monitor on soft diet.  Will require outpatient general surgery follow-up postdischarge  for possible gallbladder mass.  2.  Acute anemia with GI blood loss on top of anemia of chronic disease in the ESRD patient.  Baseline hemoglobin seems to be around 10, she was recently placed on Eliquis, unclear whether it is upper or lower GI blood loss but could be subacute in nature, for now continue to hold Eliquis, IV PPI twice daily.  GI consulted and due for EGD and colonoscopy due on 01/19/2022 patient had refused procedure  on 01/18/2022 now agreeable.  Continue to monitor H&H.  Transfuse if hemoglobin drops below 7.  3.  Acute infective endocarditis and lumbar discitis with MRSA bacteremia diagnosed recently.  On IV vancomycin with dialysis treatments.  Stop date 02/13/2022.  Pharmacy consulted.   4.  ESRD.  TTS schedule nephrology consulted .  5. Atrial flutter with [place] vas 2 score of greater than 3.  Have doubled Lopressor dose, Eliquis on hold due to suspicion of GI bleed will monitor.  6.  Hypertension.  Beta-blocker dose doubled, Norvasc and hydralazine added for better control along with as needed hydralazine.  7.  History of gout.  On allopurinol continue.       Condition - Extremely Guarded  Family Communication  :    Glenna Fellows [phone] updated on 01/17/22, 01/18/22 message left at 8:25 AM  Daughter [name] [phone] on 01/17/2022 message left at 9:50 AM.   Code Status : Full  Consults  : GI, general surgery, nephrology  PUD Prophylaxis :  PPI   Procedures  :     MRCP - 1. Cholelithiasis with evidence suggestive of acute cholecystitis. 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 Prominent borderline enlarged portacaval and hepatoduodenal ligament lymph nodes are noted. Attention to these at time of cholecystectomy is recommended, as the possibility of metastatic disease is not excluded. 2. Hepatic and splenic hemosiderosis. 3. Dilatation of the common bile  duct without intrahepatic biliary ductal dilatation. This could be benign and age-related. No obstructing choledocholithiasis identified on today's examination. Additionally, there is no definite obstructing lesion in the pancreatic head or region of the ampulla. Notably, however, there is also some mild diffuse pancreatic ductal dilatation.  CT - 1. No evidence of active GI bleed. 2. Cholelithiasis with gallbladder wall edema. If there is clinical concern for acute cholecystitis, further evaluation with ultrasound or HIDA scan recommended. 3. Masslike thickening of the gallbladder fundus may represent advanced adenomyomatosis versus neoplasm. Further evaluation with MRI without and with contrast is recommended. 4. 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 Further evaluation with lumbar spine MRI without and with contrast is recommended. 5. There is a 9 mm right middle lobe nodule      Disposition Plan  :  Inpt  DVT Prophylaxis  :    Place and maintain sequential compression device Start: 01/17/22 0629 SCDs Start: 01/16/22 2258    Lab Results  Component Value Date   PLT 213 01/19/2022    Diet :  Diet Order             Diet NPO time specified  Diet effective midnight                    Inpatient Medications  Scheduled Meds:  sodium chloride   Intravenous Once   allopurinol  200 mg Oral Daily   amLODipine  10 mg Oral Daily   bisacodyl  10 mg Oral BID   Chlorhexidine Gluconate Cloth  6 each Topical Daily   Chlorhexidine Gluconate Cloth  6 each Topical Q0600   cyclobenzaprine  2.5 mg Oral Daily   gabapentin  100 mg Oral QHS   hydrALAZINE  50 mg Oral Q8H   metoprolol tartrate  100 mg Oral BID   metroNIDAZOLE  500 mg Oral Q12H   mupirocin ointment  1 application Nasal BID   pantoprazole (PROTONIX) IV  40 mg Intravenous Q12H   senna-docusate  2 tablet Oral BID   sevelamer carbonate  1,600 mg Oral TID WC   Continuous Infusions:  cefTRIAXone (ROCEPHIN)  IV 2 g (01/19/22 1107)    vancomycin     PRN Meds:.acetaminophen **OR** acetaminophen, albuterol, hydrALAZINE, ondansetron **OR** ondansetron (ZOFRAN) IV, polyethylene glycol, white petrolatum  Antibiotics  :    Anti-infectives (From admission, onward)    Start     Dose/Rate Route Frequency Ordered Stop   01/19/22 1200  vancomycin (VANCOREADY) IVPB 500 mg/100 mL  Status:  Discontinued        500 mg 100 mL/hr over 60 Minutes Intravenous Every T-Th-Sa (Hemodialysis) 01/16/22 2258 01/17/22 1400   01/19/22 1200  vancomycin (VANCOREADY) IVPB 500 mg/100 mL  Status:  Discontinued        750 mg 150 mL/hr over 60 Minutes Intravenous Every T-Th-Sa (Hemodialysis) 01/17/22 1400 01/17/22 1415   01/19/22 1200  vancomycin (VANCOREADY) IVPB 750 mg/150 mL        750 mg 150 mL/hr over 60 Minutes Intravenous Every T-Th-Sa (Hemodialysis) 01/17/22 1415 02/13/22 2359   01/17/22 2100  vancomycin (VANCOREADY) IVPB 750 mg/150 mL        750 mg 150 mL/hr over 60 Minutes Intravenous  Once 01/17/22 1936 01/17/22 2301   01/17/22 1000  cefTRIAXone (ROCEPHIN) 2 g in sodium chloride 0.9 % 100 mL IVPB        2 g 200 mL/hr over 30 Minutes Intravenous Every 24 hours 01/17/22 0949 01/22/22 0959   01/17/22 1000  metroNIDAZOLE (FLAGYL) tablet 500 mg       Note to Pharmacy: Pharmacy can adjust for possible cholecystitis   500 mg Oral Every 12 hours 01/17/22 0949          Time Spent in minutes  30   Lala Lund M.D on 01/19/2022 at 11:38 AM  To page go to [email or web address]   [facility] -  Office  [phone]  See all Orders from today for further details    Objective:   Vitals:   01/18/22 1658 01/18/22 2055 01/19/22 0509 01/19/22 0906  BP: (!) 155/94 (!) 150/87 (!) 147/79 (!) 145/91  Pulse: 83 87 87 84  Resp: 17 16 18 18   Temp: 98.1 F (36.7 C) 98.5 F (36.9 C) 98.7 F (37.1 C) 98.7 F (37.1 C)  TempSrc:  Oral Oral Oral  SpO2: 97% 100% 97% 97%  Weight:      Height:        Wt Readings from Last 3 Encounters:   01/17/22 62.2 kg  01/13/22 64.9 kg  12/29/21 65 kg     Intake/Output Summary (Last 24 hours) at 01/19/2022 1138 Last data filed at 01/19/2022 0858 Gross per 24 hour  Intake 1820 ml  Output 250 ml  Net 1570 ml     Physical Exam  Awake Alert, x 2 No new F.N deficits, right IJ HD catheter in place [place].AT,PERRAL Supple Neck, No JVD,   Symmetrical Chest wall movement, Good air movement bilaterally, CTAB RRR,No Gallops, Rubs or new Murmurs,  +ve B.Sounds, Abd Soft, No tenderness,   No Cyanosis, Clubbing or edema       Data Review:    CBC Recent Labs  Lab 01/16/22 1550 01/17/22 0426 01/17/22 0631 01/17/22 1449 01/18/22 0345 01/19/22 0720  WBC 4.3 4.5 4.1 4.6 4.3 3.8*  HGB 8.1* 7.0* 7.9* 7.8* 7.6* 7.3*  HCT 26.6* 21.7* 25.2* 24.1* 23.6* 22.4*  PLT 249 216 234   235 237 234 213  MCV 98.5 96.4 96.6 96.4 95.5  95.3  MCH 30.0 31.1 30.3 31.2 30.8 31.1  MCHC 30.5 32.3 31.3 32.4 32.2 32.6  RDW 15.6* 15.6* 15.7* 15.7* 15.6* 15.5  LYMPHSABS 0.8  --   --   --  1.0 0.9  MONOABS 0.4  --   --   --  0.5 0.4  EOSABS 0.1  --   --   --  0.2 0.3  BASOSABS 0.0  --   --   --  0.0 0.0    Electrolytes Recent Labs  Lab 01/16/22 1550 01/16/22 1605 01/17/22 0426 01/17/22 0629 01/17/22 0631 01/18/22 0345 01/19/22 0720  NA 137  --  136  --   --  135 135  K 3.1*  --  3.2*  --   --  4.4 4.0  CL 95*  --  96*  --   --  96* 97*  CO2 30  --  30  --   --  28 25  GLUCOSE 84  --  88  --   --  91 90  BUN 9  --  12  --   --  20 29*  CREATININE 2.26*  --  3.33*  --   --  4.63* 5.96*  CALCIUM 8.5*  --  8.8*  --   --  10.0 10.2  AST 17  --  13*  --   --  15 13*  ALT 11  --  9  --   --  9 8  ALKPHOS 114  --  100  --   --  92 82  BILITOT 0.4  --  0.5  --   --  0.5 0.5  ALBUMIN 2.3*  --  2.0*  --   --  2.0* 2.1*  MG  --   --  2.0  --   --  2.0 2.0  CRP  --   --  13.4* 13.1*  --  12.5* 9.6*  DDIMER  --   --   --   --  3.58*  --   --   LATICACIDVEN  --  1.7  --   --   --   --   --   INR  --   1.3*  --   --  1.2  --   --   BNP  --   --   --   --   --  1,628.1* 1,446.7*    ------------------------------------------------------------------------------------------------------------------ No results for input(s): CHOL, HDL, LDLCALC, TRIG, CHOLHDL, LDLDIRECT in the last 72 hours.  Lab Results  Component Value Date   HGBA1C 5.4 10/18/2019    No results for input(s): TSH, T4TOTAL, T3FREE, THYROIDAB in the last 72 hours.  Invalid input(s): FREET3 ------------------------------------------------------------------------------------------------------------------ ID Labs Recent Labs  Lab 01/16/22 1550 01/16/22 1605 01/17/22 0426 01/17/22 0629 01/17/22 0631 01/17/22 1449 01/18/22 0345 01/19/22 0720  WBC 4.3  --  4.5  --  4.1 4.6 4.3 3.8*  PLT 249  --  216  --  234   235 237 234 213  CRP  --   --  13.4* 13.1*  --   --  12.5* 9.6*  DDIMER  --   --   --   --  3.58*  --   --   --   LATICACIDVEN  --  1.7  --   --   --   --   --   --   CREATININE 2.26*  --  3.33*  --   --   --  4.63* 5.96*  Cardiac Enzymes No results for input(s): CKMB, TROPONINI, MYOGLOBIN in the last 168 hours.  Invalid input(s): CK   Radiology Reports MR ABDOMEN MRCP WO CONTRAST  Result Date: 01/17/2022 CLINICAL DATA:  71 year old female with history of abdominal pain and back pain. Possible cholecystitis noted on recent CT examination, in addition to mass-like thickening of the gallbladder fundus. Further evaluation. EXAM: MRI ABDOMEN WITHOUT CONTRAST  (INCLUDING MRCP) TECHNIQUE: Multiplanar multisequence MR imaging of the abdomen was performed. Heavily T2-weighted images of the biliary and pancreatic ducts were obtained, and three-dimensional MRCP images were rendered by post processing. COMPARISON:  No prior abdominal MRI. CTA of the abdomen and pelvis 01/16/2022. FINDINGS: Comment: Today's study is limited for detection and characterization of visceral and/or vascular lesions by lack of IV gadolinium. Lower  chest: Unremarkable. Hepatobiliary: No definite suspicious cystic or solid hepatic lesions are confidently identified on today's noncontrast examination. There is diffuse low signal intensity throughout the hepatic parenchyma on in phase dual echo images, as well as on T2 weighted sequences, suggesting substantial hepatic iron deposition. No intrahepatic biliary ductal dilatation. Common bile duct is mildly dilated measuring 9 mm in the porta hepatis. No filling defect within the common bile duct to suggest choledocholithiasis. There are numerous filling defects within the gallbladder, largest of which appears impacted in the neck of the gallbladder measuring up to 2.8 cm in diameter. Associated with this there is diffuse gallbladder wall thickening and edema, along with a trace amount of pericholecystic fluid. Notably, there is mass-like thickening of the fundus of the gallbladder (axial image 22 of series 4 and coronal image 23 of series 5) which is heterogeneous in signal intensity on T1 and T2 weighted images and demonstrates internal areas of diffusion restriction, concerning for gallbladder neoplasm (poorly evaluated on today's noncontrast examination). Pancreas: No definite pancreatic mass confidently identified on today's noncontrast examination. Diffuse pancreatic ductal dilatation is noted, with the pancreatic duct measuring up to 5 mm in the body of the pancreas. No peripancreatic fluid collections or inflammatory changes. Spleen: Diffuse low signal intensity throughout the splenic parenchyma on in phase dual echo images and T2 weighted sequences, indicative of splenic iron deposition. Adrenals/Urinary Tract: Numerous T1 hypointense, T2 hyperintense lesions are noted in the kidneys bilaterally, incompletely characterized on today's noncontrast examination, but likely to represent cysts, largest of which is exophytic measuring up to 2.6 cm in the anterolateral aspect of the lower pole of the left kidney.  No hydroureteronephrosis in the visualized portions of the abdomen. Bilateral adrenal glands are normal in appearance. Stomach/Bowel: Visualized portions are unremarkable. Vascular/Lymphatic: No aneurysm identified in the visualized abdominal vasculature. Borderline enlarged portacaval lymph node measuring 1 cm in short axis (axial image 17 of series 4) and hepatoduodenal ligament lymph node (axial image 14 of series 4) measuring 1.2 cm in short axis. Other: No significant volume of ascites noted in the visualized portions of the peritoneal cavity. Small fat containing umbilical hernia. Musculoskeletal: No aggressive appearing osseous lesions are noted in the visualized portions of the skeleton. IMPRESSION: 1. Cholelithiasis with evidence suggestive of acute cholecystitis. 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 Prominent borderline enlarged portacaval and hepatoduodenal ligament lymph nodes are noted. Attention to these at time of cholecystectomy is recommended, as the possibility of metastatic disease is not excluded. 2. Hepatic and splenic hemosiderosis. 3. Dilatation of the common bile duct without intrahepatic biliary ductal dilatation. This could be benign and  age-related. No obstructing choledocholithiasis identified on today's examination. Additionally, there is no definite obstructing lesion in the pancreatic head or region of the ampulla. Notably, however, there is also some mild diffuse pancreatic ductal dilatation. If there is clinical concern for subtle neoplasm in the region of the pancreatic head or ampulla, repeat abdominal MRI with and without IV gadolinium with MRCP could be considered. Please note, however, that per report from the technologist, the patient refused to follow commands and refused to finish today's examination. Electronically Signed   By: Vinnie Langton M.D.   On:  01/17/2022 06:13   NM Hepato W/EF  Result Date: 01/18/2022 CLINICAL DATA:  Recurrent biliary colic EXAM: NUCLEAR MEDICINE HEPATOBILIARY IMAGING WITH GALLBLADDER EF TECHNIQUE: Sequential images of the abdomen were obtained out to 60 minutes following intravenous administration of radiopharmaceutical. After oral ingestion of Ensure, gallbladder ejection fraction was determined. At 60 min, normal ejection fraction is greater than 33%. RADIOPHARMACEUTICALS:  5.4 mCi Tc-56m  Choletec IV COMPARISON:  MRCP 01/17/2022 FINDINGS: Prompt uptake and biliary excretion of activity by the liver is seen. Gallbladder activity is visualized, consistent with patency of cystic duct. Biliary activity passes into small bowel, consistent with patent common bile duct. Calculated gallbladder ejection fraction is 21 %. (Normal gallbladder ejection fraction with Ensure is greater than 33%.) IMPRESSION: 1. Patent cystic duct without evidence for acute cholecystitis. 2. Decreased gallbladder ejection fraction at 21% which may reflect underlying biliary dyskinesis. Electronically Signed   By: Kerby Moors M.D.   On: 01/18/2022 12:35   DG Chest Port 1 View  Result Date: 01/16/2022 CLINICAL DATA:  Sepsis EXAM: PORTABLE CHEST 1 VIEW COMPARISON:  12/26/2021 FINDINGS: Stable positioning of right IJ central line. Stable cardiomediastinal contours. No focal airspace consolidation, pleural effusion, or pneumothorax. IMPRESSION: No active disease. Electronically Signed   By: Davina Poke D.O.   On: 01/16/2022 17:24   CT Angio Abd/Pel W and/or Wo Contrast  Result Date: 01/16/2022 CLINICAL DATA:  Lower GI bleed. EXAM: CTA ABDOMEN AND PELVIS WITHOUT AND WITH CONTRAST TECHNIQUE: Multidetector CT imaging of the abdomen and pelvis was performed using the standard protocol during bolus administration of intravenous contrast. Multiplanar reconstructed images and MIPs were obtained and reviewed to evaluate the vascular anatomy. RADIATION DOSE  REDUCTION: This exam was performed according to the departmental dose-optimization program which includes automated exposure control, adjustment of the mA and/or kV according to patient size and/or use of iterative reconstruction technique. CONTRAST:  13mL OMNIPAQUE IOHEXOL 350 MG/ML SOLN COMPARISON:  CT abdomen pelvis dated 12/17/2021. FINDINGS: VASCULAR Aorta: Mild atherosclerotic calcification. No aneurysmal dilatation or dissection. The aorta is tortuous. Celiac: Patent without evidence of aneurysm, dissection, vasculitis or significant stenosis. SMA: Patent without evidence of aneurysm, dissection, vasculitis or significant stenosis. Renals: Both renal arteries are patent without evidence of aneurysm, dissection, vasculitis, fibromuscular dysplasia or significant stenosis. IMA: Patent without evidence of aneurysm, dissection, vasculitis or significant stenosis. Inflow: Mild atherosclerotic calcification. No aneurysmal dilatation or dissection. The iliac arteries are patent. Proximal Outflow: Bilateral common femoral and visualized portions of the superficial and profunda femoral arteries are patent without evidence of aneurysm, dissection, vasculitis or significant stenosis. Veins: The IVC is unremarkable.  No portal venous gas. Review of the MIP images confirms the above findings. NON-VASCULAR Lower chest: There is a 9 mm right middle lobe nodule. Follow-up as per recommendation of CT of 12/17/2021. The visualized lung bases are otherwise clear. No intra-abdominal free air or free fluid. Hepatobiliary: The liver is unremarkable. No intrahepatic  biliary dilatation. Several noncalcified gallstones measure up to 2.7 cm. There is a 3.1 x 3.4 cm masslike thickening of the gallbladder fundus. Although this may represent adenomyomatosis, gallbladder malignancy is not excluded. Further characterization with MRI without and with contrast is recommended. There is gallbladder wall edema. Pancreas: Unremarkable. No  pancreatic ductal dilatation or surrounding inflammatory changes. Spleen: Small splenic calcification. Adrenals/Urinary Tract: The adrenal glands unremarkable. There is no hydronephrosis or nephrolithiasis on either side. Focus of cortical calcification in the inferior pole of the right kidney. Several small left renal cysts and additional bilateral hypodense lesions which are too small to characterize. The kidneys are mildly atrophic. The visualized ureters and urinary bladder appear unremarkable. Stomach/Bowel: There is moderate stool throughout the colon. There is no bowel obstruction or active inflammation. The appendix is normal. No evidence of active GI bleed. Lymphatic: No adenopathy. Reproductive: The uterus and ovaries are grossly unremarkable. Other: None Musculoskeletal: Osteopenia with degenerative changes of the spine. There is extensive erosive changes with endplate irregularity, fragmentation, and sclerotic changes and associated soft tissue thickening involving L4-L5 most consistent with spondylo discitis. Further evaluation with MRI without and with contrast is recommended. IMPRESSION: 1. No evidence of active GI bleed. 2. Cholelithiasis with gallbladder wall edema. If there is clinical concern for acute cholecystitis, further evaluation with ultrasound or HIDA scan recommended. 3. Masslike thickening of the gallbladder fundus may represent advanced adenomyomatosis versus neoplasm. Further evaluation with MRI without and with contrast is recommended. 4. 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 Further evaluation with lumbar spine MRI without and with contrast is recommended. 5. There is a 9 mm right middle lobe nodule. Follow-up as per recommendation of prior CT. 6. Aortic Atherosclerosis (ICD10-I70.0). Electronically Signed   By: Anner Crete M.D.   On: 01/16/2022 20:58

## 2022-01-19 NOTE — Progress Notes (Signed)
Called multiple times since the beginning of the shift to HD unit to inquire if pt going to HD tonight, no answer.

## 2022-01-19 NOTE — Plan of Care (Signed)
  Problem: Education: Goal: Knowledge of General Education information will improve Description: Including pain rating scale, medication(s)/side effects and non-pharmacologic comfort measures Outcome: Progressing   Problem: Health Behavior/Discharge Planning: Goal: Ability to manage health-related needs will improve Outcome: Not Progressing   

## 2022-01-19 NOTE — Progress Notes (Signed)
Patient is refusing 2nd dose of moviprep, encouraged and educated patient of the importance pf this prep, patient became irritated and verbalized " I dont want to do it anymore, they can cancel it. Im done"

## 2022-01-19 NOTE — Interval H&P Note (Signed)
History and Physical Interval Note:  No acute events overnight.  Reportedly drank most of her prep and per report now with clear yellow liquid stools.  No overt GI blood loss.  H/H stable at 7.3/22 (hemoglobin yesterday 7.6).  CA 19-9 normal.  Plan for EGD and colonoscopy today for diagnostic and potentially therapeutic intent.  Management of GB fundus lesion per surgical service.  01/19/2022 11:53 AM  Diamond [name]  has presented today for surgery, with the diagnosis of Anemia, heme positive stool.  The various methods of treatment have been discussed with the patient and family. After consideration of risks, benefits and other options for treatment, the patient has consented to  Procedure(s): ESOPHAGOGASTRODUODENOSCOPY (EGD) (Left) COLONOSCOPY (Left) as a surgical intervention.  The patient's history has been reviewed, patient examined, no change in status, stable for surgery.  I have reviewed the patient's chart and labs.  Questions were answered to the patient's satisfaction.     Diamond [name] Diamond [name]

## 2022-01-19 NOTE — Op Note (Signed)
[facility] Patient Name: Diamond [name] Procedure Date : 01/19/2022 MRN: [id number] Attending MD: Gerrit Heck , MD Date of Birth: 1951/02/14 CSN: [id number] Age: 71 Admit Type: Inpatient Procedure:                Upper GI endoscopy Indications:              Generalized abdominal pain, Acute post hemorrhagic                            anemia, Heme positive stool Providers:                Gerrit Heck, MD, Ervin Knack, RN, Frazier Richards, Technician Referring MD:              Medicines:                Monitored Anesthesia Care Complications:            No immediate complications. Estimated Blood Loss:     Estimated blood loss was minimal. Procedure:                Pre-Anesthesia Assessment:                           - Prior to the procedure, a History and Physical                            was performed, and patient medications and                            allergies were reviewed. The patient's tolerance of                            previous anesthesia was also reviewed. The risks                            and benefits of the procedure and the sedation                            options and risks were discussed with the patient.                            All questions were answered, and informed consent                            was obtained. Prior Anticoagulants: The patient has                            taken Eliquis (apixaban), last dose was 3 days                            prior to procedure. ASA Grade Assessment: III - A  patient with severe systemic disease. After                            reviewing the risks and benefits, the patient was                            deemed in satisfactory condition to undergo the                            procedure.                           After obtaining informed consent, the endoscope was                            passed under direct vision. Throughout the                             procedure, the patient's blood pressure, pulse, and                            oxygen saturations were monitored continuously. The                            GIF-H190 ([id number]) Olympus endoscope was introduced                            through the mouth, and advanced to the second part                            of duodenum. The upper GI endoscopy was                            accomplished without difficulty. The patient                            tolerated the procedure well. Scope In: Scope Out: Findings:      Multiple small plaques were found in the middle third of the esophagus       and in the lower third of the esophagus. Biopsies were taken with a cold       forceps for histology. Estimated blood loss was minimal.      The Z-line was regular and was found 40 cm from the incisors.      The entire examined stomach was normal.      Localized moderately erythematous, edematous mucosa without active       bleeding and with no stigmata of bleeding was found in the duodenal       bulb. No ulcers noted. Biopsies were taken with a cold forceps for       histology. Estimated blood loss was minimal.      The second portion of the duodenum was normal. Impression:               - Multiple plaques in the middle third of the  esophagus and in the lower third of the esophagus.                            Biopsied.                           - Z-line regular, 40 cm from the incisors.                           - Normal stomach.                           - Erythematous duodenopathy. Biopsied.                           - Normal second portion of the duodenum. Recommendation:           - Await pathology results.                           - Use Protonix (pantoprazole) 40 mg PO BID for 4                            weeks to promote mucosal healing of duodenitis,                            then reduce to 40 mg/day then titrate off if no                             other indication for long term acid suppression                            therapy.                           - Diflucan (fluconazole) 200 mg x1 then 100 mg PO                            daily for 3 weeks.                           - Perform a colonoscopy today. See colonoscopy                            report for additional recommendations for ongoing                            inpatient manegement. Procedure Code(s):        --- Professional ---                           [phone], Esophagogastroduodenoscopy, flexible,                            transoral; with biopsy, single or multiple Diagnosis Code(s):        --- Professional ---  K22.8, Other specified diseases of esophagus                           K31.89, Other diseases of stomach and duodenum                           R10.84, Generalized abdominal pain                           D62, Acute posthemorrhagic anemia                           R19.5, Other fecal abnormalities CPT copyright 2019 American [employer] Association. All rights reserved. The codes documented in this report are preliminary and upon coder review may  be revised to meet current compliance requirements. Gerrit Heck, MD 01/19/2022 1:01:17 PM Number of Addenda: 0

## 2022-01-20 ENCOUNTER — Encounter (HOSPITAL_COMMUNITY): Payer: Self-pay | Admitting: Gastroenterology

## 2022-01-20 DIAGNOSIS — D649 Anemia, unspecified: Secondary | ICD-10-CM

## 2022-01-20 LAB — CBC WITH DIFFERENTIAL/PLATELET
Abs Immature Granulocytes: 0.02 10*3/uL (ref 0.00–0.07)
Basophils Absolute: 0 10*3/uL (ref 0.0–0.1)
Basophils Relative: 1 %
Eosinophils Absolute: 0.3 10*3/uL (ref 0.0–0.5)
Eosinophils Relative: 8 %
HCT: 22 % — ABNORMAL LOW (ref 36.0–46.0)
Hemoglobin: 6.8 g/dL — CL (ref 12.0–15.0)
Immature Granulocytes: 1 %
Lymphocytes Relative: 21 %
Lymphs Abs: 0.8 10*3/uL (ref 0.7–4.0)
MCH: 30.9 pg (ref 26.0–34.0)
MCHC: 30.9 g/dL (ref 30.0–36.0)
MCV: 100 fL (ref 80.0–100.0)
Monocytes Absolute: 0.3 10*3/uL (ref 0.1–1.0)
Monocytes Relative: 8 %
Neutro Abs: 2.4 10*3/uL (ref 1.7–7.7)
Neutrophils Relative %: 61 %
Platelets: 222 10*3/uL (ref 150–400)
RBC: 2.2 MIL/uL — ABNORMAL LOW (ref 3.87–5.11)
RDW: 15.9 % — ABNORMAL HIGH (ref 11.5–15.5)
WBC: 3.8 10*3/uL — ABNORMAL LOW (ref 4.0–10.5)
nRBC: 0 % (ref 0.0–0.2)

## 2022-01-20 LAB — PREPARE RBC (CROSSMATCH)

## 2022-01-20 LAB — COMPREHENSIVE METABOLIC PANEL
ALT: 8 U/L (ref 0–44)
AST: 15 U/L (ref 15–41)
Albumin: 2.1 g/dL — ABNORMAL LOW (ref 3.5–5.0)
Alkaline Phosphatase: 77 U/L (ref 38–126)
Anion gap: 13 (ref 5–15)
BUN: 32 mg/dL — ABNORMAL HIGH (ref 8–23)
CO2: 23 mmol/L (ref 22–32)
Calcium: 9.6 mg/dL (ref 8.9–10.3)
Chloride: 98 mmol/L (ref 98–111)
Creatinine, Ser: 6.88 mg/dL — ABNORMAL HIGH (ref 0.44–1.00)
GFR, Estimated: 6 mL/min — ABNORMAL LOW (ref >60)
Glucose, Bld: 85 mg/dL (ref 70–99)
Potassium: 4.7 mmol/L (ref 3.5–5.1)
Sodium: 134 mmol/L — ABNORMAL LOW (ref 135–145)
Total Bilirubin: 0.6 mg/dL (ref 0.3–1.2)
Total Protein: 6.1 g/dL — ABNORMAL LOW (ref 6.5–8.1)

## 2022-01-20 LAB — C-REACTIVE PROTEIN: CRP: 7.2 mg/dL — ABNORMAL HIGH (ref <1.0)

## 2022-01-20 LAB — MAGNESIUM: Magnesium: 2 mg/dL (ref 1.7–2.4)

## 2022-01-20 LAB — SURGICAL PATHOLOGY

## 2022-01-20 LAB — HEPATITIS B SURFACE ANTIBODY, QUANTITATIVE: Hep B S AB Quant (Post): 4.6 m[IU]/mL — ABNORMAL LOW (ref >9.9)

## 2022-01-20 LAB — BRAIN NATRIURETIC PEPTIDE: B Natriuretic Peptide: 943.3 pg/mL — ABNORMAL HIGH (ref 0.0–100.0)

## 2022-01-20 MED ORDER — SODIUM CHLORIDE 0.9% IV SOLUTION: Freq: Once | INTRAVENOUS | Status: DC

## 2022-01-20 MED ORDER — VANCOMYCIN HCL 750 MG/150ML IV SOLN
750.0000 mg | Freq: Once | INTRAVENOUS | Status: AC
  Administered 2022-01-20: 750 mg via INTRAVENOUS
  Filled 2022-01-20: qty 150

## 2022-01-20 NOTE — Progress Notes (Addendum)
[facility] Progress Note   Subjective:   Patient seen and examined on HD. No complaints, tolerating treatment. Hgb 6.8 today, will order 1u prbc today. Unable to have HD yesterday due to staffing/inpatient census--HD moved to today.  Objective Vitals:   01/20/22 1005 01/20/22 1017 01/20/22 1030 01/20/22 1100  BP: (!) 175/100 (!) 160/92 (!) 165/99 (!) 142/84  Pulse: 76 69 75 76  Resp: 16 16 16 16   Temp: 97.9 F (36.6 C)     TempSrc: Temporal     SpO2: 100% 100%    Weight: 63.8 kg     Height:       Physical Exam General:well appearing female in NAD, laying flat in stretcher Heart:RRR, no mrg Lungs:CTAB, nml WOB on RA Abdomen:soft, NTND Extremities:no LE edema Dialysis Access: RIJ TDC in use, maturing LU AVF +b   Filed Weights   01/17/22 1948 01/19/22 1146 01/20/22 1005  Weight: 62.2 kg 62.2 kg 63.8 kg    Intake/Output Summary (Last 24 hours) at 01/20/2022 1120 Last data filed at 01/19/2022 1700 Gross per 24 hour  Intake 1140 ml  Output 625 ml  Net 515 ml    Additional Objective Labs: Basic Metabolic Panel: Recent Labs  Lab 01/19/22 0720 01/19/22 1512 01/20/22 0458  NA 135 135 134*  K 4.0 4.0 4.7  CL 97* 97* 98  CO2 25 25 23   GLUCOSE 90 92 85  BUN 29* 30* 32*  CREATININE 5.96* 6.21* 6.88*  CALCIUM 10.2 9.8 9.6  PHOS  --  5.5*   5.6*  --    Liver Function Tests: Recent Labs  Lab 01/18/22 0345 01/19/22 0720 01/19/22 1512 01/20/22 0458  AST 15 13*  --  15  ALT 9 8  --  8  ALKPHOS 92 82  --  77  BILITOT 0.5 0.5  --  0.6  PROT 6.3* 6.2*  --  6.1*  ALBUMIN 2.0* 2.1* 2.3* 2.1*   Recent Labs  Lab 01/16/22 1550  LIPASE 42   CBC: Recent Labs  Lab 01/17/22 1449 01/18/22 0345 01/19/22 0720 01/19/22 1512 01/20/22 0458  WBC 4.6 4.3 3.8* 3.7* 3.8*  NEUTROABS  --  2.5 2.1  --  2.4  HGB 7.8* 7.6* 7.3* 7.9* 6.8*  HCT 24.1* 23.6* 22.4* 24.6* 22.0*  MCV 96.4 95.5 95.3 96.5 100.0  PLT 237 234 213 225 222   Blood Culture    Component  Value Date/Time   SDES BLOOD RIGHT ARM 01/16/2022 1830   SPECREQUEST  01/16/2022 1830    BOTTLES DRAWN AEROBIC ONLY Blood Culture results may not be optimal due to an inadequate volume of blood received in culture bottles   CULT  01/16/2022 1830    NO GROWTH 4 DAYS Performed at [facility], [place] [address], [place],  [address]    REPTSTATUS PENDING 01/16/2022 1830    Cardiac Enzymes: No results for input(s): CKTOTAL, CKMB, CKMBINDEX, TROPONINI in the last 168 hours. CBG: Recent Labs  Lab 01/17/22 0045  GLUCAP 94   Iron Studies:  No results for input(s): IRON, TIBC, TRANSFERRIN, FERRITIN in the last 72 hours.  Lab Results  Component Value Date   INR 1.2 01/17/2022   INR 1.3 (H) 01/16/2022   INR 1.0 10/16/2019   Studies/Results: NM Hepato W/EF  Result Date: 01/18/2022 CLINICAL DATA:  Recurrent biliary colic EXAM: NUCLEAR MEDICINE HEPATOBILIARY IMAGING WITH GALLBLADDER EF TECHNIQUE: Sequential images of the abdomen were obtained out to 60 minutes following intravenous administration of radiopharmaceutical. After  oral ingestion of Ensure, gallbladder ejection fraction was determined. At 60 min, normal ejection fraction is greater than 33%. RADIOPHARMACEUTICALS:  5.4 mCi Tc-63m  Choletec IV COMPARISON:  MRCP 01/17/2022 FINDINGS: Prompt uptake and biliary excretion of activity by the liver is seen. Gallbladder activity is visualized, consistent with patency of cystic duct. Biliary activity passes into small bowel, consistent with patent common bile duct. Calculated gallbladder ejection fraction is 21 %. (Normal gallbladder ejection fraction with Ensure is greater than 33%.) IMPRESSION: 1. Patent cystic duct without evidence for acute cholecystitis. 2. Decreased gallbladder ejection fraction at 21% which may reflect underlying biliary dyskinesis. Electronically Signed   By: Kerby Moors M.D.   On: 01/18/2022 12:35    Medications:  sodium chloride     sodium chloride      vancomycin      sodium chloride   Intravenous Once   sodium chloride   Intravenous Once   allopurinol  200 mg Oral Daily   amLODipine  10 mg Oral Daily   Chlorhexidine Gluconate Cloth  6 each Topical Daily   Chlorhexidine Gluconate Cloth  6 each Topical Q0600   cyclobenzaprine  2.5 mg Oral Daily   fluconazole  100 mg Oral Daily   gabapentin  100 mg Oral QHS   hydrALAZINE  50 mg Oral Q8H   metoprolol tartrate  100 mg Oral BID   mupirocin ointment  1 application Nasal BID   pantoprazole  40 mg Oral BID   senna-docusate  2 tablet Oral BID   sevelamer carbonate  1,600 mg Oral TID WC    Dialysis Orders: Unit: [facility] TTS  Time: 4:00  EDW: 62 kg  Flows: 400  Bath: 2K/2Ca  Access: TDC/Maturing AVF  Heparin: Bolus 2000  ESA: Mircera 50  q 2 wks (last 01/07/22) VDRA: Hectorol 3 TIW    Assessment/Plan: ESRD -  HD TTS. Continue on schedule. HD today, will plan for HD tomorrow-back on schedule Abd pain --Cholelithiasis/Gallbladder mass on MRI/MRCP. GI consulting - HIDA scan with no evidence acute cholecystitis and decreased GB EF 21%. Gen surgery consulted - no plans for surgery at this time especially since she is being treated for endocarditis. Will require outpatient gen surg follow up for cholecystectomy with possible lymphadenectomy and central liver resection GIB - GI following. EGD/colonoscopy 2/14-no active bleed Hypertension/volume  - Blood pressure elevated.  Continue home meds. BP well controlled as outpatient. No volume excess on exam.  Acute on chronic Anemia of CKD. Suspected GIB-egd/cscope w/ no active bleed. GI following.  Transfuse prn. Next ESA due 2/16. Hgb dropping-6.8 today. 1u prbc ordered with HD today Metabolic bone disease - Corrected calcium elevated.  Use low ca bath.  Hold VDRA.  c/w renvela & low phos diet MRSA bacteremia/discitis/endocarditis - prior to admission. On vancomycin q HD until 02/13/22. Subtherapeutic vanc level at outpatient dialysis on 2/7 --Increased  Vanc to 750mg  q HD.  Atrial flutter - Eliquis on hold. Right pulmonary nodule Nutrition - currently NPO.  Renal diet w/fluid restrictions when advanced.   Gean Quint, MD [facility] 01/20/2022,11:20 AM  LOS: 3 days

## 2022-01-20 NOTE — Consult Note (Signed)
Consultation Note Date: 01/20/2022   Patient Name: Diamond [name]  DOB: 03-05-1951  MRN: [id number]  Age / Sex: 71 y.o., female  PCP: [name], MD Referring Physician: Thurnell Lose, MD  Reason for Consultation:   HPI/Patient Profile: 71 y.o. female  with past medical history of ESRD on HD, a fib on eliquis, Covid + in 10/2019, chronic anemia, asthma, HTN admitted on 01/16/2022 with complaints of low back pain, hospitalization in January for MRSA bactermia with finding of atrial thrombosis and lumbar discitis- she was discharged to SNF. Workup has revealed low hgb (today was 6.8), fecal occult positive, EGD/Colonoscopy negative, MRI concerning for gallbladder mass- surgery consulted recommending outpatient followup- she would need cholecystectomy with lymphadenectomy and liver resection. Palliative medicine consulted for [place].    Primary Decision Maker HCPOA  Discussion: I have reviewed medical records including EPIC notes, labs and imaging, assessed the patient and then met with patient and with her son Marchia Bond on speakerphone  to discuss diagnosis prognosis, [place], EOL wishes, disposition and options.  As far as functional and nutritional status- she has been undergoing PT at [facility]. Marchia Bond is unsure of her progress- the goal was to improve her ambulation to try and get her back to where she was prior to her January hospitalization. At that time she was living independently, driving, and participating in church activities. She has a good appetite- she enjoyed a box of cherry cordials during our conversation.   We discussed patient's current illness and what it means in the larger context of patient's on-going co-morbidities.  Natural disease trajectory and expectations at EOL were discussed.  I attempted to elicit values and goals of care important to the patient. Story notes that maintaining her  quality of life, continuing to work on getting stronger and more independent, and being able to interact with her family (children and Grandchildren) are her primary goals for her medical care.   We discussed the possibility of needing surgery to explore possible cancer- Dunbar shares that she would not want surgery- she states she "can just live with it" and says she has lived a good 29 years and doesn't want to "go looking for things".  I attempted to confirm that she understood that the consequence of not doing surgery could possibly be that she in fact does have a cancer that could progress if not confirmed and treatment discussed- however, at that point she began to perseverate on her thoughts regarding the fact that she is talking to an nurse practitioner, and that she has not had any conversation with a medical doctor about these things.   Markeshia shared that she is tired of hearing "bad news" from medical providers and she didn't wish to discuss the what ifs any longer.    Discussed with patient/family the importance of continued conversation with family and the medical providers regarding overall plan of care and treatment options, ensuring decisions are within the context of the patients values and GOCs.    We also discussed code status and  [place] endorses desire for full code at this time. She does not have any desire to de-escalate her care, although she wishes to consider each intervention as it is offered, and consider how it will affect her overall quality of life.   I called Marchia Bond separately after meeting at the bedside- we discussed the hopes that Leeandra will become stronger and more independent, but also encouraged him to be prepared that she may continue to decline.   Currently their plan is for Anaya to discharge home and Marchia Bond and his brothers and sisters are prepared to provide around the clock care for her along with home health.  Palliative Care services outpatient were explained  and offered. Marchia Bond agrees to outpatient referral.   Questions and concerns were addressed. The family was encouraged to call with questions or concerns.      SUMMARY OF RECOMMENDATIONS -Continue full scope full code, recommend carefully considering the balance of how invasive interventions will affect her quality of life - this is very important to [facility] referral for outpatient Palliative    Code Status/Advance Care Planning: Full code   Prognosis:   Unable to determine  Discharge Planning: Home with Home Health  Primary Diagnoses: Present on Admission:  Acute blood loss anemia  Acute infective endocarditis  Septic discitis of lumbar region  Atrial flutter ([place])  Essential hypertension  Hypokalemia   Review of Systems  Physical Exam Vitals and nursing note reviewed.  Constitutional:      Appearance: She is not ill-appearing.  Cardiovascular:     Rate and Rhythm: Normal rate.     Pulses: Normal pulses.  Neurological:     General: No focal deficit present.     Mental Status: She is alert.    Vital Signs: BP 138/67    Pulse 86    Temp 98.1 F (36.7 C)    Resp (!) 27    Ht '5\' 7"'  (1.702 m)    Wt 63.8 kg    SpO2 100%    BMI 22.03 kg/m  Pain Scale: 0-10   Pain Score: 0-No pain   SpO2: SpO2: 100 % O2 Device:SpO2: 100 % O2 Flow Rate: .   IO: Intake/output summary:  Intake/Output Summary (Last 24 hours) at 01/20/2022 1612 Last data filed at 01/20/2022 1300 Gross per 24 hour  Intake 915 ml  Output 625 ml  Net 290 ml    LBM: Last BM Date : 01/19/22 Baseline Weight: Weight: 65 kg Most recent weight: Weight: 63.8 kg     Palliative Assessment/Data: PPS: 40%       Thank you for this consult. Palliative medicine will continue to follow and assist as needed.   Time Total: 105 minutes Greater than 50%  of this time was spent counseling and coordinating care related to the above assessment and plan.  Signed by: Mariana Kaufman, AGNP-C Palliative  Medicine    Please contact Palliative Medicine Team phone at [phone] for questions and concerns.  For individual provider: See Shea Evans

## 2022-01-20 NOTE — Plan of Care (Signed)
  Problem: Clinical Measurements: Goal: Ability to maintain clinical measurements within normal limits will improve Outcome: Progressing   

## 2022-01-20 NOTE — Anesthesia Postprocedure Evaluation (Signed)
Anesthesia Post Note  Patient: Diamond [name]  Procedure(s) Performed: ESOPHAGOGASTRODUODENOSCOPY (EGD) (Left) COLONOSCOPY (Left) BIOPSY POLYPECTOMY     Patient location during evaluation: Endoscopy Anesthesia Type: MAC Level of consciousness: awake and alert Pain management: pain level controlled Vital Signs Assessment: post-procedure vital signs reviewed and stable Respiratory status: spontaneous breathing, nonlabored ventilation, respiratory function stable and patient connected to nasal cannula oxygen Cardiovascular status: stable and blood pressure returned to baseline Postop Assessment: no apparent nausea or vomiting Anesthetic complications: no   No notable events documented.  Last Vitals:  Vitals:   01/20/22 1626 01/20/22 2100  BP: (!) 179/91 (!) 157/84  Pulse: 91 99  Resp: 18 18  Temp: 37.1 C 37.3 C  SpO2: 99% 98%    Last Pain:  Vitals:   01/20/22 1330  TempSrc:   PainSc: 0-No pain                 Tareek Sabo

## 2022-01-20 NOTE — Progress Notes (Signed)
Occupational Therapy Treatment Patient Details Name: Diamond [name] MRN: [id number] DOB: [birth year] Today's Date: 01/20/2022   History of present illness Pt is a 71 y/o female admitted from [facility] 01/16/22 with abdominal pain. Workup for acute on chronic anemia, possible cholecystitis vs gallbladder mass. CT negative for active GI bleed. 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 PMH includes ESRD on HD, asthma, HTN, anemia, a fib.   OT comments  Pt seen for second session to assist with transfer back to bed as pt declining staff assist for dialysis transport. With gentle encouragement and problem solving optimal sequencing, OT able to assist pt back to bed via squat pivot at Max A. Pt reports increased back pain/stiffness hindering standing abilities though also limited by confusion/anxious behaviors. Pt continues to benefit from slow pacing and acknowledgement of preferences to prevent agitation and maximize participation.    Recommendations for follow up therapy are one component of a multi-disciplinary discharge planning process, led by the attending physician.  Recommendations may be updated based on patient status, additional functional criteria and insurance authorization.    Follow Up Recommendations  Skilled nursing-short term rehab (<3 hours/day)    Assistance Recommended at Discharge Frequent or constant Supervision/Assistance  Patient can return home with the following  A lot of help with bathing/dressing/bathroom;Assistance with cooking/housework;Direct supervision/assist for medications management;Direct supervision/assist for financial management;Assist for transportation;Help with stairs or ramp for entrance   Equipment Recommendations  BSC/3in1    Recommendations for Other Services      Precautions / Restrictions Precautions Precautions:  Fall Restrictions Weight Bearing Restrictions: No       Mobility Bed Mobility Overal bed mobility: Needs Assistance Bed Mobility: Sit to Supine     Supine to sit: Min assist, HOB elevated Sit to supine: Min assist   General bed mobility comments: assist to get BLE back into bed    Transfers Overall transfer level: Needs assistance Equipment used: None Transfers: Bed to chair/wheelchair/BSC Sit to Stand: Mod assist   Squat pivot transfers: Max assist       General transfer comment: pt reports "able to move my feet but cannot stand up due to back". Attempted to get pt to hold to bed rail but difficulty initiating and sequencing steps and ultimately required max A manual pivot back to bed     Balance Overall balance assessment: Needs assistance Sitting-balance support: Feet supported Sitting balance-Leahy Scale: Good     Standing balance support: Bilateral upper extremity supported, During functional activity Standing balance-Leahy Scale: Poor                             ADL either performed or assessed with clinical judgement   ADL Overall ADL's : Needs assistance/impaired                         Toilet Transfer: Moderate assistance;Squat-pivot Toilet Transfer Details (indicate cue type and reason): simulated to recliner           General ADL Comments: Focus on body mechanics, progression OOB    Extremity/Trunk Assessment Upper Extremity Assessment Upper Extremity Assessment: Generalized weakness   Lower Extremity Assessment Lower Extremity Assessment: Defer to PT evaluation        Vision   Vision Assessment?: No apparent visual deficits   Perception     Praxis  Cognition Arousal/Alertness: Awake/alert Behavior During Therapy: Flat affect Overall Cognitive Status: No family/caregiver present to determine baseline cognitive functioning Area of Impairment: Attention, Memory, Following commands, Safety/judgement,  Awareness, Problem solving                   Current Attention Level: Selective Memory: Decreased short-term memory Following Commands: Follows one step commands with increased time, Follows one step commands inconsistently Safety/Judgement: Decreased awareness of safety, Decreased awareness of deficits Awareness: Intellectual Problem Solving: Slow processing, Decreased initiation, Requires verbal cues, Requires tactile cues General Comments: anxious with movement and decreased insight into deficits, poor motor planning and initiation. requires increased time to process and respond to encouragement        Exercises      Shoulder Instructions       General Comments      Pertinent Vitals/ Pain       Pain Assessment Pain Assessment: Faces Faces Pain Scale: Hurts little more Pain Location: low back Pain Descriptors / Indicators: Other (Comment) (stiff) Pain Intervention(s): Monitored during session  Home Living                                          Prior Functioning/Environment              Frequency  Min 2X/week        Progress Toward Goals  OT Goals(current goals can now be found in the care plan section)  Progress towards OT goals: Progressing toward goals  Acute Rehab OT Goals Patient Stated Goal: be able to walk OT Goal Formulation: With patient Time For Goal Achievement: 02/01/22 Potential to Achieve Goals: Good ADL Goals Pt Will Perform Grooming: with min guard assist;standing Pt Will Perform Lower Body Bathing: with min guard assist;sitting/lateral leans;sit to/from stand Pt Will Perform Lower Body Dressing: with modified independence;sit to/from stand Pt Will Transfer to Toilet: with min guard assist;stand pivot transfer;bedside commode Additional ADL Goal #1: Pt to complete formal cognitive assessment  Plan Discharge plan remains appropriate;Frequency remains appropriate    Co-evaluation                 AM-PAC  OT "6 Clicks" Daily Activity     Outcome Measure   Help from another person eating meals?: None Help from another person taking care of personal grooming?: A Little Help from another person toileting, which includes using toliet, bedpan, or urinal?: A Lot Help from another person bathing (including washing, rinsing, drying)?: A Lot Help from another person to put on and taking off regular upper body clothing?: A Little Help from another person to put on and taking off regular lower body clothing?: A Lot 6 Click Score: 16    End of Session Equipment Utilized During Treatment: Gait belt  OT Visit Diagnosis: Unsteadiness on feet (R26.81);Other abnormalities of gait and mobility (R26.89);Muscle weakness (generalized) (M62.81);Pain   Activity Tolerance Patient tolerated treatment well   Patient Left in bed;with call bell/phone within reach;with bed alarm set   Nurse Communication Mobility status        Time: 1829-9371 OT Time Calculation (min): 22 min  Charges: OT General Charges $OT Visit: 1 Visit OT Treatments $Self Care/Home Management : 8-22 mins $Therapeutic Activity: 8-22 mins  Malachy Chamber, OTR/L Acute Rehab Services Office: [phone]   Layla Maw 01/20/2022, 9:59 AM

## 2022-01-20 NOTE — Progress Notes (Addendum)
PROGRESS NOTE                                                                                                                                                                                                             Patient Demographics:    Diamond [name], is a 71 y.o. 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  In the ER was suggestive of Hemoccult positive stool, acute on chronic anemia along with possible cholecystitis and she was admitted.   Subjective:   Patient in bed, appears comfortable, denies any headache, no fever, no chest pain or pressure, no shortness of breath , no abdominal pain. No new focal weakness.   Assessment  & Plan :    Acute abdominal pain mostly in the right upper quadrant.  Suspicious for acute or subacute cholecystitis - MRCP has been noted with possible cholecystitis and gallbladder mass -  GI and general surgery both were consulted and saw the patient, she was placed on empiric IV Rocephin and Flagyl, she underwent HIDA scan which was unremarkable, EF on HIDA scan though was slightly compromised.  General surgery has signed off and have no further recommendations, will stop antibiotics on 01/19/2022 and monitor on soft diet.  Will require outpatient general surgery follow-up postdischarge  for possible gallbladder mass.  2.  Acute anemia with GI blood loss on top of anemia of chronic disease in the ESRD patient.  Baseline hemoglobin seems to be around 10, she was recently placed on Eliquis, seen by GI underwent EGD and colonoscopy on 01/19/2022 with no active bleeding, nonspecific esophageal plaques and Candida esophagitis noted, some polyps removed during colonoscopy, no active bleeding.  Hemoglobin unfortunately has dropped below 7 on 01/20/2022 as she will receive 1  unit of packed RBC but I think there is a large element of heme dilution and it due to missed dialysis yesterday caused by busy HD schedule.  No signs of active bleeding.  Continue PPI, if H&H remained stable on 01/21/2022 will resume Eliquis at that time.  3.  Acute infective endocarditis and lumbar discitis with MRSA bacteremia diagnosed recently.  On IV vancomycin with dialysis treatments.  Stop date 02/13/2022.  Pharmacy consulted.   4.  ESRD.  TTS schedule nephrology consulted .  5. Atrial flutter with [place] vas 2 score of greater than 3.  Have doubled Lopressor dose, Eliquis on hold due to suspicion of GI bleed will monitor.  6.  Hypertension.  Beta-blocker dose doubled, Norvasc and hydralazine added for better control along with as needed hydralazine.  7.  History of gout.  On allopurinol continue.  8. Candida esophagitis - Diflucan (fluconazole) 200 mg x1 then 100 mg PO daily for 3 weeks.  Stop date 02/08/2022.      Condition - Extremely Guarded  Family Communication  :    Glenna Fellows [phone] updated on 01/17/22, 01/18/22 message left at 8:25 AM, 01/20/22 message left at 3.19pm  Daughter [name] [phone] on 01/17/2022 message left at 9:50 AM.   Code Status : Full  Consults  : GI, general surgery, nephrology  PUD Prophylaxis :  PPI   Procedures  :     Colonoscopy - Findings:      Skin tags were found on perianal exam.      Two sessile polyps were found in the sigmoid colon. The polyps were 3 to        5 mm in size. These polyps were removed with a cold snare. Resection and       retrieval were complete. Estimated blood loss was minimal.      Normal mucosa was found in the entire colon. No areas of mucosal       erythema, edema, erosions, or ulceration. No blood in the GI lumen.      Retroflexion in the rectum was not performed due to anatomy (narrowed       rectal vault). Anterograde views were otherwise normal appearing. Impression:               - Perianal skin tags found on perianal exam.                           - Two 3 to 5 mm polyps in the sigmoid colon,                            removed with a cold snare. Resected and retrieved.                           - Normal mucosa in the entire examined colon. Recommendation:           - Return patient to hospital ward for ongoing care.                           - Continue present medications.                           - Await pathology results.                           -  Repeat colonoscopy for surveillance based on                            pathology results.                           - Unless planning on surgical procedure, ok from GI                            standpoint to resume Eliquis (apixaban) at prior                            dose tomorrow.   EGD -   Impression:               - Multiple plaques in the middle third of the                            esophagus and in the lower third of the esophagus.                            Biopsied.                           - Z-line regular, 40 cm from the incisors.                           - Normal stomach.                           - Erythematous duodenopathy. Biopsied.                           - Normal second portion of the duodenum. Recommendation:           - Await pathology results.                           - Use Protonix (pantoprazole) 40 mg PO BID for 4                            weeks to promote mucosal healing of duodenitis,                            then reduce  to 40 mg/day then titrate off if no                            other indication for long term acid suppression                            therapy.                           - Diflucan (fluconazole) 200 mg x1 then 100 mg PO  daily for 3 weeks.                           - Perform a colonoscopy today. See colonoscopy                            report for additional recommendations for ongoing                            inpatient manegement.   MRCP - 1. Cholelithiasis with evidence suggestive of acute cholecystitis. 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 Prominent borderline enlarged portacaval and hepatoduodenal ligament lymph nodes are noted. Attention to these at time of cholecystectomy is recommended, as the possibility of metastatic disease is not excluded. 2. Hepatic and splenic hemosiderosis. 3. Dilatation of the common bile duct without intrahepatic biliary ductal dilatation. This could be benign and age-related. No obstructing choledocholithiasis identified on today's examination. Additionally, there is no definite obstructing lesion in the pancreatic head or region of the ampulla. Notably, however, there is also some mild diffuse pancreatic ductal dilatation.  CT - 1. No evidence of active GI bleed. 2. Cholelithiasis with gallbladder wall edema. If there is clinical concern for acute cholecystitis, further evaluation with ultrasound or HIDA scan recommended. 3. Masslike thickening of the gallbladder fundus may represent advanced adenomyomatosis versus neoplasm. Further evaluation with MRI without and with contrast is recommended. 4. 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 Further evaluation with lumbar spine MRI without and with contrast is recommended. 5. There is a 9 mm right middle lobe nodule      Disposition Plan  :  Inpt  DVT Prophylaxis  :    Place  and maintain sequential compression device Start: 01/17/22 0629 SCDs Start: 01/16/22 2258    Lab Results  Component Value Date   PLT 222 01/20/2022    Diet :  Diet Order             Diet renal with fluid restriction Fluid restriction: 1200 mL Fluid; Room service appropriate? Yes; Fluid consistency: Thin  Diet effective now                    Inpatient Medications  Scheduled Meds:  sodium chloride   Intravenous Once   sodium chloride   Intravenous Once   allopurinol  200 mg Oral Daily   amLODipine  10 mg Oral Daily   Chlorhexidine Gluconate Cloth  6 each Topical Daily   Chlorhexidine Gluconate Cloth  6 each Topical Q0600   cyclobenzaprine  2.5 mg Oral Daily   fluconazole  100 mg Oral Daily   gabapentin  100 mg Oral QHS   hydrALAZINE  50 mg Oral Q8H   metoprolol tartrate  100 mg Oral BID   mupirocin ointment  1 application Nasal BID   pantoprazole  40 mg Oral BID   senna-docusate  2 tablet Oral BID   sevelamer carbonate  1,600 mg Oral TID WC   Continuous Infusions:  sodium chloride     sodium chloride     vancomycin     PRN Meds:.sodium chloride, sodium chloride, acetaminophen **OR** acetaminophen, albuterol, alteplase, heparin, hydrALAZINE, lidocaine (PF), lidocaine-prilocaine, ondansetron **OR** ondansetron (ZOFRAN) IV, pentafluoroprop-tetrafluoroeth, polyethylene glycol, white petrolatum  Antibiotics  :    Anti-infectives (From admission, onward)  Start     Dose/Rate Route Frequency Ordered Stop   01/20/22 1000  fluconazole (DIFLUCAN) tablet 100 mg       Note to Pharmacy: 200 mg x1 then 100 mg daily x20 days more   100 mg Oral Daily 01/19/22 1408 02/09/22 0959   01/19/22 1500  fluconazole (DIFLUCAN) tablet 100 mg  Status:  Discontinued       Note to Pharmacy: 200 mg x1 then 100 mg daily x20 days more   100 mg Oral Daily 01/19/22 1404 01/19/22 1408   01/19/22 1500  fluconazole (DIFLUCAN) tablet 200 mg        200 mg Oral  Once 01/19/22 1408 01/19/22 1843    01/19/22 1200  vancomycin (VANCOREADY) IVPB 500 mg/100 mL  Status:  Discontinued        500 mg 100 mL/hr over 60 Minutes Intravenous Every T-Th-Sa (Hemodialysis) 01/16/22 2258 01/17/22 1400   01/19/22 1200  vancomycin (VANCOREADY) IVPB 500 mg/100 mL  Status:  Discontinued        750 mg 150 mL/hr over 60 Minutes Intravenous Every T-Th-Sa (Hemodialysis) 01/17/22 1400 01/17/22 1415   01/19/22 1200  vancomycin (VANCOREADY) IVPB 750 mg/150 mL        750 mg 150 mL/hr over 60 Minutes Intravenous Every T-Th-Sa (Hemodialysis) 01/17/22 1415 02/13/22 2359   01/17/22 2100  vancomycin (VANCOREADY) IVPB 750 mg/150 mL        750 mg 150 mL/hr over 60 Minutes Intravenous  Once 01/17/22 1936 01/17/22 2301   01/17/22 1000  cefTRIAXone (ROCEPHIN) 2 g in sodium chloride 0.9 % 100 mL IVPB  Status:  Discontinued        2 g 200 mL/hr over 30 Minutes Intravenous Every 24 hours 01/17/22 0949 01/19/22 1140   01/17/22 1000  metroNIDAZOLE (FLAGYL) tablet 500 mg  Status:  Discontinued       Note to Pharmacy: Pharmacy can adjust for possible cholecystitis   500 mg Oral Every 12 hours 01/17/22 0949 01/19/22 1140        Time Spent in minutes  30   Lala Lund M.D on 01/20/2022 at 8:48 AM  To page go to [email or web address]   [facility] -  Office  [phone]  See all Orders from today for further details    Objective:   Vitals:   01/19/22 1348 01/19/22 1648 01/19/22 2150 01/20/22 0526  BP: (!) 177/98 (!) 178/102 (!) 182/96 (!) 168/95  Pulse: 76 83 80 82  Resp:  18 17 18   Temp: 98.2 F (36.8 C) 98 F (36.7 C) 98 F (36.7 C) 98.3 F (36.8 C)  TempSrc:   Oral Oral  SpO2: 98% 98% 98% 100%  Weight:      Height:        Wt Readings from Last 3 Encounters:  01/19/22 62.2 kg  01/13/22 64.9 kg  12/29/21 65 kg     Intake/Output Summary (Last 24 hours) at 01/20/2022 0848 Last data filed at 01/19/2022 1700 Gross per 24 hour  Intake 1140 ml  Output 625 ml  Net 515 ml     Physical  Exam  Awake Alert, x 2 No new F.N deficits, right IJ HD catheter in place [place].AT,PERRAL Supple Neck, No JVD,   Symmetrical Chest wall movement, Good air movement bilaterally, CTAB RRR,No Gallops, Rubs or new Murmurs,  +ve B.Sounds, Abd Soft, No tenderness,   No Cyanosis, Clubbing or edema        Data Review:    CBC Recent Labs  Lab  01/16/22 1550 01/17/22 0426 01/17/22 1449 01/18/22 0345 01/19/22 0720 01/19/22 1512 01/20/22 0458  WBC 4.3   < > 4.6 4.3 3.8* 3.7* 3.8*  HGB 8.1*   < > 7.8* 7.6* 7.3* 7.9* 6.8*  HCT 26.6*   < > 24.1* 23.6* 22.4* 24.6* 22.0*  PLT 249   < > 237 234 213 225 222  MCV 98.5   < > 96.4 95.5 95.3 96.5 100.0  MCH 30.0   < > 31.2 30.8 31.1 31.0 30.9  MCHC 30.5   < > 32.4 32.2 32.6 32.1 30.9  RDW 15.6*   < > 15.7* 15.6* 15.5 15.7* 15.9*  LYMPHSABS 0.8  --   --  1.0 0.9  --  0.8  MONOABS 0.4  --   --  0.5 0.4  --  0.3  EOSABS 0.1  --   --  0.2 0.3  --  0.3  BASOSABS 0.0  --   --  0.0 0.0  --  0.0   < > = values in this interval not displayed.    Electrolytes Recent Labs  Lab 01/16/22 1550 01/16/22 1605 01/17/22 0426 01/17/22 0629 01/17/22 0631 01/18/22 0345 01/19/22 0720 01/19/22 1512 01/20/22 0458  NA 137  --  136  --   --  135 135 135 134*  K 3.1*  --  3.2*  --   --  4.4 4.0 4.0 4.7  CL 95*  --  96*  --   --  96* 97* 97* 98  CO2 30  --  30  --   --  28 25 25 23   GLUCOSE 84  --  88  --   --  91 90 92 85  BUN 9  --  12  --   --  20 29* 30* 32*  CREATININE 2.26*  --  3.33*  --   --  4.63* 5.96* 6.21* 6.88*  CALCIUM 8.5*  --  8.8*  --   --  10.0 10.2 9.8 9.6  AST 17  --  13*  --   --  15 13*  --  15  ALT 11  --  9  --   --  9 8  --  8  ALKPHOS 114  --  100  --   --  92 82  --  77  BILITOT 0.4  --  0.5  --   --  0.5 0.5  --  0.6  ALBUMIN 2.3*  --  2.0*  --   --  2.0* 2.1* 2.3* 2.1*  MG  --   --  2.0  --   --  2.0 2.0  --  2.0  CRP  --   --  13.4* 13.1*  --  12.5* 9.6*  --  7.2*  DDIMER  --   --   --   --  3.58*  --   --   --   --    LATICACIDVEN  --  1.7  --   --   --   --   --   --   --   INR  --  1.3*  --   --  1.2  --   --   --   --   BNP  --   --   --   --   --  1,628.1* 1,446.7*  --  943.3*    ------------------------------------------------------------------------------------------------------------------ No results for input(s): CHOL, HDL, LDLCALC, TRIG, CHOLHDL, LDLDIRECT in the last 72 hours.  Lab  Results  Component Value Date   HGBA1C 5.4 10/18/2019    No results for input(s): TSH, T4TOTAL, T3FREE, THYROIDAB in the last 72 hours.  Invalid input(s): FREET3 ------------------------------------------------------------------------------------------------------------------ ID Labs Recent Labs  Lab 01/16/22 1605 01/17/22 0426 01/17/22 0629 01/17/22 0631 01/17/22 1449 01/18/22 0345 01/19/22 0720 01/19/22 1512 01/20/22 0458  WBC  --  4.5  --  4.1 4.6 4.3 3.8* 3.7* 3.8*  PLT  --  216  --  234   235 237 234 213 225 222  CRP  --  13.4* 13.1*  --   --  12.5* 9.6*  --  7.2*  DDIMER  --   --   --  3.58*  --   --   --   --   --   LATICACIDVEN 1.7  --   --   --   --   --   --   --   --   CREATININE  --  3.33*  --   --   --  4.63* 5.96* 6.21* 6.88*   Cardiac Enzymes No results for input(s): CKMB, TROPONINI, MYOGLOBIN in the last 168 hours.  Invalid input(s): CK   Radiology Reports MR ABDOMEN MRCP WO CONTRAST  Result Date: 01/17/2022 CLINICAL DATA:  71 year old female with history of abdominal pain and back pain. Possible cholecystitis noted on recent CT examination, in addition to mass-like thickening of the gallbladder fundus. Further evaluation. EXAM: MRI ABDOMEN WITHOUT CONTRAST  (INCLUDING MRCP) TECHNIQUE: Multiplanar multisequence MR imaging of the abdomen was performed. Heavily T2-weighted images of the biliary and pancreatic ducts were obtained, and three-dimensional MRCP images were rendered by post processing. COMPARISON:  No prior abdominal MRI. CTA of the abdomen and pelvis 01/16/2022.  FINDINGS: Comment: Today's study is limited for detection and characterization of visceral and/or vascular lesions by lack of IV gadolinium. Lower chest: Unremarkable. Hepatobiliary: No definite suspicious cystic or solid hepatic lesions are confidently identified on today's noncontrast examination. There is diffuse low signal intensity throughout the hepatic parenchyma on in phase dual echo images, as well as on T2 weighted sequences, suggesting substantial hepatic iron deposition. No intrahepatic biliary ductal dilatation. Common bile duct is mildly dilated measuring 9 mm in the porta hepatis. No filling defect within the common bile duct to suggest choledocholithiasis. There are numerous filling defects within the gallbladder, largest of which appears impacted in the neck of the gallbladder measuring up to 2.8 cm in diameter. Associated with this there is diffuse gallbladder wall thickening and edema, along with a trace amount of pericholecystic fluid. Notably, there is mass-like thickening of the fundus of the gallbladder (axial image 22 of series 4 and coronal image 23 of series 5) which is heterogeneous in signal intensity on T1 and T2 weighted images and demonstrates internal areas of diffusion restriction, concerning for gallbladder neoplasm (poorly evaluated on today's noncontrast examination). Pancreas: No definite pancreatic mass confidently identified on today's noncontrast examination. Diffuse pancreatic ductal dilatation is noted, with the pancreatic duct measuring up to 5 mm in the body of the pancreas. No peripancreatic fluid collections or inflammatory changes. Spleen: Diffuse low signal intensity throughout the splenic parenchyma on in phase dual echo images and T2 weighted sequences, indicative of splenic iron deposition. Adrenals/Urinary Tract: Numerous T1 hypointense, T2 hyperintense lesions are noted in the kidneys bilaterally, incompletely characterized on today's noncontrast examination, but  likely to represent cysts, largest of which is exophytic measuring up to 2.6 cm in the anterolateral aspect of the lower pole of the left  kidney. No hydroureteronephrosis in the visualized portions of the abdomen. Bilateral adrenal glands are normal in appearance. Stomach/Bowel: Visualized portions are unremarkable. Vascular/Lymphatic: No aneurysm identified in the visualized abdominal vasculature. Borderline enlarged portacaval lymph node measuring 1 cm in short axis (axial image 17 of series 4) and hepatoduodenal ligament lymph node (axial image 14 of series 4) measuring 1.2 cm in short axis. Other: No significant volume of ascites noted in the visualized portions of the peritoneal cavity. Small fat containing umbilical hernia. Musculoskeletal: No aggressive appearing osseous lesions are noted in the visualized portions of the skeleton. IMPRESSION: 1. Cholelithiasis with evidence suggestive of acute cholecystitis. 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 Prominent borderline enlarged portacaval and hepatoduodenal ligament lymph nodes are noted. Attention to these at time of cholecystectomy is recommended, as the possibility of metastatic disease is not excluded. 2. Hepatic and splenic hemosiderosis. 3. Dilatation of the common bile duct without intrahepatic biliary ductal dilatation. This could be benign and age-related. No obstructing choledocholithiasis identified on today's examination. Additionally, there is no definite obstructing lesion in the pancreatic head or region of the ampulla. Notably, however, there is also some mild diffuse pancreatic ductal dilatation. If there is clinical concern for subtle neoplasm in the region of the pancreatic head or ampulla, repeat abdominal MRI with and without IV gadolinium with MRCP could be considered. Please note, however, that per report from the technologist,  the patient refused to follow commands and refused to finish today's examination. Electronically Signed   By: Vinnie Langton M.D.   On: 01/17/2022 06:13   NM Hepato W/EF  Result Date: 01/18/2022 CLINICAL DATA:  Recurrent biliary colic EXAM: NUCLEAR MEDICINE HEPATOBILIARY IMAGING WITH GALLBLADDER EF TECHNIQUE: Sequential images of the abdomen were obtained out to 60 minutes following intravenous administration of radiopharmaceutical. After oral ingestion of Ensure, gallbladder ejection fraction was determined. At 60 min, normal ejection fraction is greater than 33%. RADIOPHARMACEUTICALS:  5.4 mCi Tc-3m  Choletec IV COMPARISON:  MRCP 01/17/2022 FINDINGS: Prompt uptake and biliary excretion of activity by the liver is seen. Gallbladder activity is visualized, consistent with patency of cystic duct. Biliary activity passes into small bowel, consistent with patent common bile duct. Calculated gallbladder ejection fraction is 21 %. (Normal gallbladder ejection fraction with Ensure is greater than 33%.) IMPRESSION: 1. Patent cystic duct without evidence for acute cholecystitis. 2. Decreased gallbladder ejection fraction at 21% which may reflect underlying biliary dyskinesis. Electronically Signed   By: Kerby Moors M.D.   On: 01/18/2022 12:35   DG Chest Port 1 View  Result Date: 01/16/2022 CLINICAL DATA:  Sepsis EXAM: PORTABLE CHEST 1 VIEW COMPARISON:  12/26/2021 FINDINGS: Stable positioning of right IJ central line. Stable cardiomediastinal contours. No focal airspace consolidation, pleural effusion, or pneumothorax. IMPRESSION: No active disease. Electronically Signed   By: Davina Poke D.O.   On: 01/16/2022 17:24   CT Angio Abd/Pel W and/or Wo Contrast  Result Date: 01/16/2022 CLINICAL DATA:  Lower GI bleed. EXAM: CTA ABDOMEN AND PELVIS WITHOUT AND WITH CONTRAST TECHNIQUE: Multidetector CT imaging of the abdomen and pelvis was performed using the standard protocol during bolus administration of  intravenous contrast. Multiplanar reconstructed images and MIPs were obtained and reviewed to evaluate the vascular anatomy. RADIATION DOSE REDUCTION: This exam was performed according to the departmental dose-optimization program which includes automated exposure control, adjustment of the mA and/or kV according to patient size and/or use of iterative  reconstruction technique. CONTRAST:  66mL OMNIPAQUE IOHEXOL 350 MG/ML SOLN COMPARISON:  CT abdomen pelvis dated 12/17/2021. FINDINGS: VASCULAR Aorta: Mild atherosclerotic calcification. No aneurysmal dilatation or dissection. The aorta is tortuous. Celiac: Patent without evidence of aneurysm, dissection, vasculitis or significant stenosis. SMA: Patent without evidence of aneurysm, dissection, vasculitis or significant stenosis. Renals: Both renal arteries are patent without evidence of aneurysm, dissection, vasculitis, fibromuscular dysplasia or significant stenosis. IMA: Patent without evidence of aneurysm, dissection, vasculitis or significant stenosis. Inflow: Mild atherosclerotic calcification. No aneurysmal dilatation or dissection. The iliac arteries are patent. Proximal Outflow: Bilateral common femoral and visualized portions of the superficial and profunda femoral arteries are patent without evidence of aneurysm, dissection, vasculitis or significant stenosis. Veins: The IVC is unremarkable.  No portal venous gas. Review of the MIP images confirms the above findings. NON-VASCULAR Lower chest: There is a 9 mm right middle lobe nodule. Follow-up as per recommendation of CT of 12/17/2021. The visualized lung bases are otherwise clear. No intra-abdominal free air or free fluid. Hepatobiliary: The liver is unremarkable. No intrahepatic biliary dilatation. Several noncalcified gallstones measure up to 2.7 cm. There is a 3.1 x 3.4 cm masslike thickening of the gallbladder fundus. Although this may represent adenomyomatosis, gallbladder malignancy is not excluded.  Further characterization with MRI without and with contrast is recommended. There is gallbladder wall edema. Pancreas: Unremarkable. No pancreatic ductal dilatation or surrounding inflammatory changes. Spleen: Small splenic calcification. Adrenals/Urinary Tract: The adrenal glands unremarkable. There is no hydronephrosis or nephrolithiasis on either side. Focus of cortical calcification in the inferior pole of the right kidney. Several small left renal cysts and additional bilateral hypodense lesions which are too small to characterize. The kidneys are mildly atrophic. The visualized ureters and urinary bladder appear unremarkable. Stomach/Bowel: There is moderate stool throughout the colon. There is no bowel obstruction or active inflammation. The appendix is normal. No evidence of active GI bleed. Lymphatic: No adenopathy. Reproductive: The uterus and ovaries are grossly unremarkable. Other: None Musculoskeletal: Osteopenia with degenerative changes of the spine. There is extensive erosive changes with endplate irregularity, fragmentation, and sclerotic changes and associated soft tissue thickening involving L4-L5 most consistent with spondylo discitis. Further evaluation with MRI without and with contrast is recommended. IMPRESSION: 1. No evidence of active GI bleed. 2. Cholelithiasis with gallbladder wall edema. If there is clinical concern for acute cholecystitis, further evaluation with ultrasound or HIDA scan recommended. 3. Masslike thickening of the gallbladder fundus may represent advanced adenomyomatosis versus neoplasm. Further evaluation with MRI without and with contrast is recommended. 4. 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 Further evaluation with lumbar spine MRI without and with contrast is recommended. 5. There is a 9 mm right middle lobe nodule. Follow-up as per recommendation of prior CT. 6. Aortic Atherosclerosis (ICD10-I70.0). Electronically Signed   By: Anner Crete M.D.   On:  01/16/2022 20:58

## 2022-01-20 NOTE — Progress Notes (Signed)
Notified by lab with critical result of Hemoglobin 6.8 g/dL. MD Candiss Norse ordered to transfuse 1 Unit of RBC it can be done in HD if patient is going first session this morning, called HD unit, spoke with [facility], pt. is on her list to go this morning. MD Candiss Norse made aware. Will relay to dayshift RN.

## 2022-01-20 NOTE — Progress Notes (Signed)
Occupational Therapy Treatment Patient Details Name: Diamond [name] MRN: [id number] DOB: 21-May-1951 Today's Date: 01/20/2022   History of present illness Pt is a 71 y/o female admitted from [facility] 01/16/22 with abdominal pain. Workup for acute on chronic anemia, possible cholecystitis vs gallbladder mass. CT negative for active GI bleed. 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 PMH includes ESRD on HD, asthma, HTN, anemia, a fib.   OT comments  Pt with gradual progression towards established OT goals. Pt engaged in standing and first OOB transfers for breakfast this AM. With initial standing attempt using RW, pt with difficulty standing fully upright with trunk in flexed position but able to complete squat pivot without DME and Mod A from therapist to successfully swing hips to recliner surface. Emphasis on: stretching/mobility for back pain, body mechanics to decrease fall risk to achieve pt's goals of being able to walk to bathroom. Continue to rec SNF rehab at DC unless family able to provide 24/7 physical assist for ADLs/transfers at home.    Recommendations for follow up therapy are one component of a multi-disciplinary discharge planning process, led by the attending physician.  Recommendations may be updated based on patient status, additional functional criteria and insurance authorization.    Follow Up Recommendations  Skilled nursing-short term rehab (<3 hours/day)    Assistance Recommended at Discharge Frequent or constant Supervision/Assistance  Patient can return home with the following  A lot of help with bathing/dressing/bathroom;Assistance with cooking/housework;Direct supervision/assist for medications management;Direct supervision/assist for financial management;Assist for transportation;Help with stairs or ramp for entrance   Equipment Recommendations   BSC/3in1    Recommendations for Other Services      Precautions / Restrictions Precautions Precautions: Fall Restrictions Weight Bearing Restrictions: No       Mobility Bed Mobility Overal bed mobility: Needs Assistance Bed Mobility: Supine to Sit     Supine to sit: Min assist, HOB elevated     General bed mobility comments: Min A to lift trunk via handheld assist    Transfers Overall transfer level: Needs assistance Equipment used: Rolling walker (2 wheels), 1 person hand held assist Transfers: Sit to/from Stand, Bed to chair/wheelchair/BSC Sit to Stand: Mod assist   Squat pivot transfers: Mod assist       General transfer comment: Attempt sit to stand with RW at bedside, able to clear bottom well, slow progression of hands on lower parts of RW to RW handles. Tactile cues to tuck bottom in and pull shoulders up due to trunk flexion position with increased time to achieve this and unable to sustain. Guided pt in Mod A squat pivot to recliner for breakfast with cues for hand placement and assist to fully swing hips to chair     Balance Overall balance assessment: Needs assistance Sitting-balance support: Feet supported Sitting balance-Leahy Scale: Good     Standing balance support: Bilateral upper extremity supported, During functional activity Standing balance-Leahy Scale: Poor                             ADL either performed or assessed with clinical judgement   ADL Overall ADL's : Needs assistance/impaired                         Toilet Transfer: Moderate assistance;Squat-pivot Toilet Transfer Details (indicate cue type and reason): simulated to recliner  General ADL Comments: Focus on body mechanics, progression OOB    Extremity/Trunk Assessment Upper Extremity Assessment Upper Extremity Assessment: Generalized weakness   Lower Extremity Assessment Lower Extremity Assessment: Defer to PT evaluation        Vision    Vision Assessment?: No apparent visual deficits   Perception     Praxis      Cognition Arousal/Alertness: Awake/alert Behavior During Therapy: Flat affect Overall Cognitive Status: No family/caregiver present to determine baseline cognitive functioning Area of Impairment: Attention, Memory, Following commands, Safety/judgement, Awareness, Problem solving                   Current Attention Level: Selective Memory: Decreased short-term memory Following Commands: Follows one step commands with increased time, Follows one step commands inconsistently Safety/Judgement: Decreased awareness of safety, Decreased awareness of deficits Awareness: Intellectual Problem Solving: Slow processing, Decreased initiation, Requires verbal cues, Requires tactile cues General Comments: pt receptive to education with gentle encouragement and when given options. memory deficits still evident as pt unable to recall why her back feels stiff (MD reports due to infection). increased time to follow directions and cues for safe sequencing. reports desire to be able to walk but decreased insight into what needs to be done to achieve this goal        Exercises      Shoulder Instructions       General Comments      Pertinent Vitals/ Pain       Pain Assessment Pain Assessment: Faces Faces Pain Scale: Hurts a little bit Pain Location: low back Pain Descriptors / Indicators: Other (Comment) (Stiff) Pain Intervention(s): Monitored during session  Home Living                                          Prior Functioning/Environment              Frequency  Min 2X/week        Progress Toward Goals  OT Goals(current goals can now be found in the care plan section)  Progress towards OT goals: Progressing toward goals  Acute Rehab OT Goals Patient Stated Goal: be able to walk again OT Goal Formulation: With patient Time For Goal Achievement: 02/01/22 Potential to  Achieve Goals: Good ADL Goals Pt Will Perform Grooming: with min guard assist;standing Pt Will Perform Lower Body Bathing: with min guard assist;sitting/lateral leans;sit to/from stand Pt Will Perform Lower Body Dressing: with modified independence;sit to/from stand Pt Will Transfer to Toilet: with min guard assist;stand pivot transfer;bedside commode Additional ADL Goal #1: Pt to complete formal cognitive assessment  Plan Discharge plan remains appropriate;Frequency remains appropriate    Co-evaluation                 AM-PAC OT "6 Clicks" Daily Activity     Outcome Measure   Help from another person eating meals?: None Help from another person taking care of personal grooming?: A Little Help from another person toileting, which includes using toliet, bedpan, or urinal?: A Lot Help from another person bathing (including washing, rinsing, drying)?: A Lot Help from another person to put on and taking off regular upper body clothing?: A Little Help from another person to put on and taking off regular lower body clothing?: A Lot 6 Click Score: 16    End of Session Equipment Utilized During Treatment: Rolling walker (2 wheels)  OT  Visit Diagnosis: Unsteadiness on feet (R26.81);Other abnormalities of gait and mobility (R26.89);Muscle weakness (generalized) (M62.81);Pain   Activity Tolerance Patient tolerated treatment well   Patient Left in chair;with call bell/phone within reach;Other (comment) (called out to desk for chair alarm pad (not in room) but delayed response and staff unable to bring to room)   Nurse Communication Mobility status        Time: 1093-2355 OT Time Calculation (min): 33 min  Charges: OT General Charges $OT Visit: 1 Visit OT Treatments $Self Care/Home Management : 8-22 mins $Therapeutic Activity: 8-22 mins  Malachy Chamber, OTR/L Acute Rehab Services Office: [phone]   Layla Maw 01/20/2022, 8:01 AM

## 2022-01-20 NOTE — Progress Notes (Signed)
The patient's chart has been reviewed and discussed with primary medical team.  The patient will likely need a cholecystectomy with possible lymphadenectomy and central liver resection.  Given she is still being treated for endocarditis and follow up echo isn't until March to evaluate for resolution, we will hold on surgical intervention until this has resolved and she is improved for surgery.  We will have her follow up in the office with Dr. Zenia Resides for further evaluation in mid March.  No further surgical recommendations at this point.  We will sign off.  Henreitta Cea 10:49 AM 01/20/2022

## 2022-01-20 NOTE — Progress Notes (Signed)
PT Cancellation Note  Patient Details Name: Diamond [name] MRN: [id number] DOB: 08/20/1951   Cancelled Treatment:    Reason Eval/Treat Not Completed: Patient at procedure or test/unavailable Off unit in HD. PT will re-attempt as time allows.   Zayveon Raschke A. Gilford Rile PT, DPT Acute Rehabilitation Services Pager [phone] Office [phone]    Linna Hoff 01/20/2022, 1:33 PM

## 2022-01-21 LAB — TYPE AND SCREEN
ABO/RH(D): O POS
Antibody Screen: NEGATIVE
Unit division: 0

## 2022-01-21 LAB — BRAIN NATRIURETIC PEPTIDE: B Natriuretic Peptide: 760.8 pg/mL — ABNORMAL HIGH (ref 0.0–100.0)

## 2022-01-21 LAB — COMPREHENSIVE METABOLIC PANEL
ALT: 19 U/L (ref 0–44)
AST: 64 U/L — ABNORMAL HIGH (ref 15–41)
Albumin: 2.1 g/dL — ABNORMAL LOW (ref 3.5–5.0)
Alkaline Phosphatase: 165 U/L — ABNORMAL HIGH (ref 38–126)
Anion gap: 10 (ref 5–15)
BUN: 10 mg/dL (ref 8–23)
CO2: 28 mmol/L (ref 22–32)
Calcium: 9.3 mg/dL (ref 8.9–10.3)
Chloride: 99 mmol/L (ref 98–111)
Creatinine, Ser: 3.95 mg/dL — ABNORMAL HIGH (ref 0.44–1.00)
GFR, Estimated: 12 mL/min — ABNORMAL LOW (ref >60)
Glucose, Bld: 103 mg/dL — ABNORMAL HIGH (ref 70–99)
Potassium: 3.7 mmol/L (ref 3.5–5.1)
Sodium: 137 mmol/L (ref 135–145)
Total Bilirubin: 0.6 mg/dL (ref 0.3–1.2)
Total Protein: 6.4 g/dL — ABNORMAL LOW (ref 6.5–8.1)

## 2022-01-21 LAB — C-REACTIVE PROTEIN: CRP: 6.2 mg/dL — ABNORMAL HIGH (ref <1.0)

## 2022-01-21 LAB — CULTURE, BLOOD (ROUTINE X 2)
Culture: NO GROWTH
Culture: NO GROWTH

## 2022-01-21 LAB — CBC WITH DIFFERENTIAL/PLATELET
Abs Immature Granulocytes: 0.03 10*3/uL (ref 0.00–0.07)
Basophils Absolute: 0 10*3/uL (ref 0.0–0.1)
Basophils Relative: 0 %
Eosinophils Absolute: 0.1 10*3/uL (ref 0.0–0.5)
Eosinophils Relative: 4 %
HCT: 25.8 % — ABNORMAL LOW (ref 36.0–46.0)
Hemoglobin: 8.3 g/dL — ABNORMAL LOW (ref 12.0–15.0)
Immature Granulocytes: 1 %
Lymphocytes Relative: 16 %
Lymphs Abs: 0.7 10*3/uL (ref 0.7–4.0)
MCH: 29.5 pg (ref 26.0–34.0)
MCHC: 32.2 g/dL (ref 30.0–36.0)
MCV: 91.8 fL (ref 80.0–100.0)
Monocytes Absolute: 0.5 10*3/uL (ref 0.1–1.0)
Monocytes Relative: 13 %
Neutro Abs: 2.7 10*3/uL (ref 1.7–7.7)
Neutrophils Relative %: 66 %
Platelets: 171 10*3/uL (ref 150–400)
RBC: 2.81 MIL/uL — ABNORMAL LOW (ref 3.87–5.11)
RDW: 16.8 % — ABNORMAL HIGH (ref 11.5–15.5)
WBC: 4 10*3/uL (ref 4.0–10.5)
nRBC: 0 % (ref 0.0–0.2)

## 2022-01-21 LAB — BPAM RBC
Blood Product Expiration Date: 202303032359
ISSUE DATE / TIME: 202302151211
Unit Type and Rh: 5100

## 2022-01-21 LAB — MAGNESIUM: Magnesium: 1.9 mg/dL (ref 1.7–2.4)

## 2022-01-21 MED ORDER — APIXABAN 2.5 MG PO TABS: 2.5000 mg | ORAL_TABLET | Freq: Two times a day (BID) | ORAL | Status: DC

## 2022-01-21 MED ORDER — HEPARIN SODIUM (PORCINE) 1000 UNIT/ML IJ SOLN
INTRAMUSCULAR | Status: AC
  Filled 2022-01-21: qty 1

## 2022-01-21 MED ORDER — APIXABAN 5 MG PO TABS
5.0000 mg | ORAL_TABLET | Freq: Two times a day (BID) | ORAL | Status: DC
  Administered 2022-01-21 – 2022-01-22 (×3): 5 mg via ORAL
  Filled 2022-01-21 (×3): qty 1

## 2022-01-21 NOTE — Progress Notes (Signed)
Physical Therapy Treatment Patient Details Name: Diamond [name] MRN: [id number] DOB: March 31, 1951 Today's Date: 01/21/2022   History of Present Illness Pt is a 71 y/o female admitted from [facility] 01/16/22 with abdominal pain. Workup for acute on chronic anemia, possible cholecystitis vs gallbladder mass. CT negative for active GI bleed. 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 PMH includes ESRD on HD, asthma, HTN, anemia, a fib.    PT Comments    Patient declined mobility this date despite encouragement. Daughter and granddaughter present and son present via phone call. Educated patient and family members on transfer techniques, stair negotiation with w/c, and necessary equipment to return home safely, patient and family verbalized understanding. Discussed with family about car transfer to return home or ambulance transport. Family feels as though they can transport patient home via personal car. Patient will need hospital bed and [facility] for discharge home. Family reports they have wheelchair. Updated recommendation to HHPT at discharge to progress mobility.    Recommendations for follow up therapy are one component of a multi-disciplinary discharge planning process, led by the attending physician.  Recommendations may be updated based on patient status, additional functional criteria and insurance authorization.  Follow Up Recommendations  Home health PT (per family request)     Assistance Recommended at Discharge Frequent or constant Supervision/Assistance  Patient can return home with the following A lot of help with walking and/or transfers;A lot of help with bathing/dressing/bathroom;Assistance with cooking/housework;Assist for transportation;Help with stairs or ramp for entrance;Direct supervision/assist for financial management;Direct supervision/assist for medications  management   Equipment Recommendations  BSC/3in1;Wheelchair (measurements PT);Wheelchair cushion (measurements PT);Hospital bed    Recommendations for Other Services       Precautions / Restrictions Precautions Precautions: Fall Restrictions Weight Bearing Restrictions: No     Mobility  Bed Mobility               General bed mobility comments: patient declined bed mobility. Session focused on verbal family education to daughter and granddaughter in person and son via phone call on transfers, stair negotiation with w/c, and equipment needs    Transfers                        Ambulation/Gait                   Stairs             Wheelchair Mobility    Modified Rankin (Stroke Patients Only)       Balance                                            Cognition Arousal/Alertness: Awake/alert Behavior During Therapy: Flat affect Overall Cognitive Status: Impaired/Different from baseline Area of Impairment: Attention, Memory, Following commands, Safety/judgement, Awareness, Problem solving                   Current Attention Level: Selective Memory: Decreased short-term memory Following Commands: Follows one step commands with increased time, Follows one step commands inconsistently Safety/Judgement: Decreased awareness of safety, Decreased awareness of deficits Awareness: Intellectual Problem Solving: Slow processing, Decreased initiation, Requires verbal cues, Requires tactile cues General Comments: seems generally confused. Family at bedside doesn't seem concerned        Exercises  General Comments        Pertinent Vitals/Pain Pain Assessment Pain Assessment: No/denies pain    Home Living                          Prior Function            PT Goals (current goals can now be found in the care plan section) Acute Rehab PT Goals PT Goal Formulation: With patient Time For Goal  Achievement: 02/01/22 Potential to Achieve Goals: Fair Progress towards PT goals: Progressing toward goals    Frequency    Min 3X/week      PT Plan Discharge plan needs to be updated    Co-evaluation              AM-PAC PT "6 Clicks" Mobility   Outcome Measure  Help needed turning from your back to your side while in a flat bed without using bedrails?: A Little Help needed moving from lying on your back to sitting on the side of a flat bed without using bedrails?: A Lot Help needed moving to and from a bed to a chair (including a wheelchair)?: Total Help needed standing up from a chair using your arms (e.g., wheelchair or bedside chair)?: Total Help needed to walk in hospital room?: Total Help needed climbing 3-5 steps with a railing? : Total 6 Click Score: 9    End of Session     Patient left: in bed;with call bell/phone within reach;with bed alarm set Nurse Communication: Mobility status PT Visit Diagnosis: Muscle weakness (generalized) (M62.81);Other abnormalities of gait and mobility (R26.89)     Time: 6578-4696 PT Time Calculation (min) (ACUTE ONLY): 24 min  Charges:  $Therapeutic Activity: 23-37 mins                     Audi Conover A. Gilford Rile PT, DPT Acute Rehabilitation Services Pager [phone] Office [phone]    Linna Hoff 01/21/2022, 2:53 PM

## 2022-01-21 NOTE — Progress Notes (Signed)
ANTICOAGULATION CONSULT NOTE - Follow Up Consult  Pharmacy Consult for Eliquis Indication: atrial fibrillation  Allergies  Allergen Reactions   Shrimp (Diagnostic) Other (See Comments)    Listed on [facility]    Patient Measurements: Height: 5\' 7"  (170.2 cm) Weight: 60.2 kg (132 lb 11.5 oz) IBW/kg (Calculated) : 61.6 Eliquis Dosing Weight: 60.2 kg  Vital Signs: Temp: 98.5 F (36.9 C) (02/16 1133) Temp Source: Oral (02/16 1104) BP: 147/81 (02/16 1133) Pulse Rate: 79 (02/16 1133)  Labs: Recent Labs    01/19/22 1512 01/20/22 0458 01/21/22 0228  HGB 7.9* 6.8* 8.3*  HCT 24.6* 22.0* 25.8*  PLT 225 222 171  CREATININE 6.21* 6.88* 3.95*   ESRD  Assessment:  PTA Eliquis 5 mg BID for atrial fibrillation held on admit due to heme positive stools. S/p EGD and colonoscopy on 2/14, no active bleeding. To resume Eliquis today.  Hgb 6.8 on 2/15 and 1 units PRBCs given > improved to 8.3.  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    71 yrs old, ESRD and 60.2 kg today.  EDW 62 kg per renal.   No dose reduction due to hx RA thrombus.  Goal of Therapy:  Appropriate Eliquis dose for indication Monitor platelets by anticoagulation protocol: Yes   Plan:  Resume Eliquis 5 mg PO BID. Follow up for any bleeding.  Arty Baumgartner, RPh 01/21/2022,11:37 AM

## 2022-01-21 NOTE — TOC Progression Note (Addendum)
Transition of Care [facility]) - Initial/Assessment Note    Patient Details  Name: Diamond [name] MRN: [id number] Date of Birth: 1951-08-26  Transition of [facility]) CM/SW Contact:    Milinda Antis, LCSWA Phone Number: 01/21/2022, 12:01 PM  Clinical Narrative:                 CSW spoke with the patient's daughter [name] [phone]) and son Marchia Bond [phone]).  The family informed CSW that they would like for the patient to come home with home health.  The family will be at the home to supervise the patient.  The family prefers [facility] [phone]) and report that the patient does not have any DME at the home.  The patient's daughter also inquired about how to become the patient's [place].    RNCM informed of the above information and will follow up.    15:30-  CSW received a returned call from patient's son Marchia Bond.  He informed CSW that the patient would be discharging to the patient's daughter's home.  Pollie Meyer at [address] [place], [place] [address].       Patient Goals and CMS Choice        Expected Discharge Plan and Services                                                Prior Living Arrangements/Services                       Activities of Daily Living      Permission Sought/Granted                  Emotional Assessment              Admission diagnosis:  Acute blood loss anemia [D62] Generalized abdominal pain [R10.84] Anemia, unspecified type [D64.9] Patient Active Problem List   Diagnosis Date Noted   Multiple polyps of sigmoid colon    Heme positive stool    Duodenitis    Candida esophagitis (HCC)    Generalized abdominal pain    Gallbladder mass 01/17/2022   Acute blood loss anemia 01/16/2022   Acute infective endocarditis 01/16/2022   Septic discitis of lumbar region 01/16/2022   Hypotension 12/27/2021   Bleeding from dialysis catheter site 12/26/2021   Atrial flutter ([place])  12/25/2021   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 seen on TEE on 1/17 12/24/2021   Lung nodule seen on imaging study 12/23/2021   MRSA bacteremia    Vertebral osteomyelitis ([place])    Leukocytosis 12/17/2021   Severe sepsis ([place]) due to MRSA bacteremia, L5-S1 discitis and endocarditis 12/17/2021   Lactic acidosis 12/17/2021   Low back pain 12/17/2021   ESRD on hemodialysis ([place]) 12/17/2021   Mild intermittent asthma 12/17/2021   Vitamin D deficiency 01/02/2021   Hyperparathyroidism ([place]) 12/31/2020   CKD (chronic kidney disease), stage III ([place]) 11/10/2019   Cardiomegaly 11/05/2019   Hyperkalemia 11/05/2019   Hypokalemia 10/18/2019   Pneumonia due to COVID-19 virus 10/17/2019   CAP (community acquired pneumonia) 10/16/2019   Essential hypertension    Asthma    Gout    Hyponatremia    Multifactorial anemia-acute blood loss from bleeding HD catheter site-superimposed on anemia related to ESRD.    PCP:  [name], MD Pharmacy:   [facility] -  [place], [place] - [address] [place] [address] Phone: [phone] Fax: [phone]     Social Determinants of Health (SDOH) Interventions    Readmission Risk Interventions No flowsheet data found.

## 2022-01-21 NOTE — Plan of Care (Signed)
  Problem: Education: Goal: Knowledge of General Education information will improve Description: Including pain rating scale, medication(s)/side effects and non-pharmacologic comfort measures Outcome: Progressing   Problem: Health Behavior/Discharge Planning: Goal: Ability to manage health-related needs will improve Outcome: Progressing   Problem: Activity: Goal: Risk for activity intolerance will decrease Outcome: Progressing   

## 2022-01-21 NOTE — Progress Notes (Signed)
[facility] Progress Note   Subjective:   Patient seen and examined at bedside in dialysis. Tolerating treatment well but appears a little confused when initially woke up.  Was not sure where she was in the hospital.  Admits to abdominal pain.  Denies CP, SOB, and n/v/d.  Palliative care consult yesterday, wants to remain full code.   Objective Vitals:   01/20/22 2100 01/21/22 0506 01/21/22 0736 01/21/22 0753  BP: (!) 157/84 (!) 144/88 (!) 163/88 (!) 151/87  Pulse: 99 83 78 80  Resp: 18 18 20    Temp: 99.1 F (37.3 C) 98.7 F (37.1 C) 98.9 F (37.2 C)   TempSrc:  Oral Oral   SpO2: 98% 100% 100%   Weight:   61 kg   Height:       Physical Exam General:well appearing female in NAD Heart:RRR, no mrg Lungs:CTAB, nml WOB onRA Abdomen:soft, NTND Extremities:no LE edema Dialysis Access: TDC in use, maturing LU AVF +b/t   Filed Weights   01/20/22 1005 01/20/22 1330 01/21/22 0736  Weight: 63.8 kg 62.6 kg 61 kg    Intake/Output Summary (Last 24 hours) at 01/21/2022 0849 Last data filed at 01/21/2022 0308 Gross per 24 hour  Intake 732 ml  Output 2185 ml  Net -1453 ml    Additional Objective Labs: Basic Metabolic Panel: Recent Labs  Lab 01/19/22 1512 01/20/22 0458 01/21/22 0228  NA 135 134* 137  K 4.0 4.7 3.7  CL 97* 98 99  CO2 25 23 28   GLUCOSE 92 85 103*  BUN 30* 32* 10  CREATININE 6.21* 6.88* 3.95*  CALCIUM 9.8 9.6 9.3  PHOS 5.5*   5.6*  --   --    Liver Function Tests: Recent Labs  Lab 01/19/22 0720 01/19/22 1512 01/20/22 0458 01/21/22 0228  AST 13*  --  15 64*  ALT 8  --  8 19  ALKPHOS 82  --  77 165*  BILITOT 0.5  --  0.6 0.6  PROT 6.2*  --  6.1* 6.4*  ALBUMIN 2.1* 2.3* 2.1* 2.1*   Recent Labs  Lab 01/16/22 1550  LIPASE 42   CBC: Recent Labs  Lab 01/18/22 0345 01/19/22 0720 01/19/22 1512 01/20/22 0458 01/21/22 0228  WBC 4.3 3.8* 3.7* 3.8* 4.0  NEUTROABS 2.5 2.1  --  2.4 2.7  HGB 7.6* 7.3* 7.9* 6.8* 8.3*  HCT 23.6* 22.4*  24.6* 22.0* 25.8*  MCV 95.5 95.3 96.5 100.0 91.8  PLT 234 213 225 222 171   Blood Culture    Component Value Date/Time   SDES BLOOD RIGHT ARM 01/16/2022 1830   SPECREQUEST  01/16/2022 1830    BOTTLES DRAWN AEROBIC ONLY Blood Culture results may not be optimal due to an inadequate volume of blood received in culture bottles   CULT  01/16/2022 1830    NO GROWTH 5 DAYS Performed at [facility], [address], [place], [place] [address]    REPTSTATUS 01/21/2022 FINAL 01/16/2022 1830     Medications:  vancomycin      allopurinol  200 mg Oral Daily   amLODipine  10 mg Oral Daily   Chlorhexidine Gluconate Cloth  6 each Topical Daily   Chlorhexidine Gluconate Cloth  6 each Topical Q0600   cyclobenzaprine  2.5 mg Oral Daily   fluconazole  100 mg Oral Daily   gabapentin  100 mg Oral QHS   hydrALAZINE  50 mg Oral Q8H   metoprolol tartrate  100 mg Oral BID   mupirocin  ointment  1 application Nasal BID   pantoprazole  40 mg Oral BID   senna-docusate  2 tablet Oral BID   sevelamer carbonate  1,600 mg Oral TID WC    Dialysis Orders: Unit: [facility] TTS  Time: 4:00  EDW: 62 kg  Flows: 400  Bath: 2K/2Ca  Access: TDC/Maturing AVF  Heparin: Bolus 2000  ESA: Mircera 50  q 2 wks (last 01/07/22) VDRA: Hectorol 3 TIW    Assessment/Plan: ESRD -  HD TTS. Continue on schedule. HD today. Abd pain --Cholelithiasis/Gallbladder mass on MRI/MRCP. GI consulting - HIDA scan with no evidence acute cholecystitis and decreased GB EF 21%. Gen surgery consulted - no plans for surgery at this time especially since she is being treated for endocarditis. Will require outpatient gen surg follow up for cholecystectomy with possible lymphadenectomy and central liver resection GIB - GI following. EGD/colonoscopy 2/14-no active bleed Hypertension/volume  - Blood pressure elevated.  Continue home meds. BP well controlled as outpatient. No volume excess on exam.  Acute on chronic Anemia of CKD. Suspected  GIB-egd/cscope w/ no active bleed. GI following.  Transfuse prn. Next ESA due 2/16. Hgb 6.8 yesterday s/p 1 unit pRBC improved to 8.3. Metabolic bone disease - Corrected calcium elevated.  Use low ca bath.  Hold VDRA.  c/w renvela & low phos diet MRSA bacteremia/discitis/endocarditis - prior to admission. On vancomycin q HD until 02/13/22. Subtherapeutic vanc level at outpatient dialysis on 2/7 --Increased Vanc to 750mg  q HD.  Atrial flutter - Eliquis on hold. Right pulmonary nodule Nutrition - Renal diet w/fluid restrictions when advanced.   Jen Mow, PA-C [facility] 01/21/2022,8:49 AM  LOS: 4 days

## 2022-01-21 NOTE — Progress Notes (Signed)
PROGRESS NOTE                                                                                                                                                                                                             Patient Demographics:    Diamond [name], is a 71 y.o. female, DOB - [birth year], SUP:[id number]  Outpatient Primary MD for the patient is Osei-Bonsu, Iona Beard, MD    LOS - 4  Admit date - 01/16/2022    Chief Complaint  Patient presents with   Abdominal Pain       Brief Narrative (HPI from H&P) 71 year old African-American female with known past medical history of end-stage renal disease (HD Tues, Thurs, Sat), asthma, hypertension, anemia of chronic disease, atrial flutter (on eliquis) and recent diagnosis of suspected infective endocarditis with right atrial mass as well as MRSA bacteremia with lumbar discitis who presents to [facility] emergency department via EMS with complaints of abdominal pain.  In the ER was suggestive of Hemoccult positive stool, acute on chronic anemia along with possible cholecystitis and she was admitted.   Subjective:   Patient in bed, appears comfortable, denies any headache, no fever, no chest pain or pressure, no shortness of breath , no abdominal pain. No new focal weakness.    Assessment  & Plan :    Acute abdominal pain mostly in the right upper quadrant.  Suspicious for acute or subacute cholecystitis - MRCP has been noted with possible cholecystitis and gallbladder mass -  GI and general surgery both were consulted and saw the patient, she was placed on empiric IV Rocephin and Flagyl, she underwent HIDA scan which was unremarkable, EF on HIDA scan though was slightly compromised.  General surgery has signed off and have no further recommendations, will stop antibiotics on 01/19/2022 and monitor on PO diet.  Will require outpatient general surgery follow-up postdischarge for  possible gallbladder mass.  2.  Acute anemia with GI blood loss on top of anemia of chronic disease in the ESRD patient.  Baseline hemoglobin seems to be around 10, she was recently placed on Eliquis, seen by GI underwent EGD and colonoscopy on 01/19/2022 with no active bleeding, nonspecific esophageal plaques and Candida esophagitis noted, some polyps removed during colonoscopy, no active bleeding.  Hemoglobin unfortunately has dropped below 7 on 01/20/2022 as she will receive  1 unit of packed RBC but I think there is a large element of heme dilution and it due to missed dialysis yesterday caused by busy HD schedule.  No signs of active bleeding.  Continue PPI, H&H appears stable posttransfusion will resume Eliquis on 01/21/2022 if she tolerates it well and likely discharge likely on 01/22/2022  3.  Acute infective endocarditis and lumbar discitis with MRSA bacteremia diagnosed recently.  On IV vancomycin with dialysis treatments.  Stop date 02/13/2022.  Pharmacy consulted.   4.  ESRD.  TTS schedule nephrology consulted .  5. Atrial flutter with [place] vas 2 score of greater than 3.  Have doubled Lopressor dose, Eliquis resumed on 01/21/2022.  6.  Hypertension.  Beta-blocker dose doubled, Norvasc and hydralazine added for better control along with as needed hydralazine.  7.  History of gout.  On allopurinol continue.  8. Candida esophagitis - Diflucan (fluconazole) 200 mg x1 then 100 mg PO daily for 3 weeks.  Stop date 02/08/2022.      Condition - Extremely Guarded  Family Communication  :    Glenna Fellows [phone] updated on 01/17/22, 01/18/22 message left at 8:25 AM, 01/20/22 message left at 3.19pm, 01/21/22 message at 7.17am  Daughter [name] [phone] on 01/17/2022 message left at 9:50 AM, 01/21/22 @7 .18am - message left   Code Status : Full  Consults  : GI, general surgery, nephrology  PUD Prophylaxis :  PPI   Procedures  :     Colonoscopy - Findings:      Skin tags were found on  perianal exam.      Two sessile polyps were found in the sigmoid colon. The polyps were 3 to       5 mm in size. These polyps were removed with a cold snare. Resection and       retrieval were complete. Estimated blood loss was minimal.      Normal mucosa was found in the entire colon. No areas of mucosal       erythema, edema, erosions, or ulceration. No blood in the GI lumen.      Retroflexion in the rectum was not performed due to anatomy (narrowed       rectal vault). Anterograde views were otherwise normal appearing. Impression:               - Perianal skin tags found on perianal exam.                           - Two 3 to 5 mm polyps in the sigmoid colon,                            removed with a cold snare. Resected and retrieved.                           - Normal mucosa in the entire examined colon. Recommendation:           - Return patient to hospital ward for ongoing care.                           - Continue present medications.                           - Await pathology results.                           -  Repeat colonoscopy for surveillance based on                            pathology results.                           - Unless planning on surgical procedure, ok from GI                            standpoint to resume Eliquis (apixaban) at prior                            dose tomorrow.   EGD -   Impression:               - Multiple plaques in the middle third of the                            esophagus and in the lower third of the esophagus.                            Biopsied.                           - Z-line regular, 40 cm from the incisors.                           - Normal stomach.                           - Erythematous duodenopathy. Biopsied.                           - Normal second portion of the duodenum. Recommendation:           - Await pathology results.                           - Use Protonix (pantoprazole) 40 mg PO BID for 4                             weeks to promote mucosal healing of duodenitis,                            then reduce to 40 mg/day then titrate off if no                            other indication for long term acid suppression                            therapy.                           - Diflucan (fluconazole) 200 mg x1 then 100 mg PO  daily for 3 weeks.                           - Perform a colonoscopy today. See colonoscopy                            report for additional recommendations for ongoing                            inpatient manegement.   MRCP - 1. Cholelithiasis with evidence suggestive of acute cholecystitis. 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 Prominent borderline enlarged portacaval and hepatoduodenal ligament lymph nodes are noted. Attention to these at time of cholecystectomy is recommended, as the possibility of metastatic disease is not excluded. 2. Hepatic and splenic hemosiderosis. 3. Dilatation of the common bile duct without intrahepatic biliary ductal dilatation. This could be benign and age-related. No obstructing choledocholithiasis identified on today's examination. Additionally, there is no definite obstructing lesion in the pancreatic head or region of the ampulla. Notably, however, there is also some mild diffuse pancreatic ductal dilatation.  CT - 1. No evidence of active GI bleed. 2. Cholelithiasis with gallbladder wall edema. If there is clinical concern for acute cholecystitis, further evaluation with ultrasound or HIDA scan recommended. 3. Masslike thickening of the gallbladder fundus may represent advanced adenomyomatosis versus neoplasm. Further evaluation with MRI without and with contrast is recommended. 4. 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 Further evaluation with lumbar spine MRI without and with contrast is recommended. 5. There is a 9 mm  right middle lobe nodule      Disposition Plan  :  Inpt  DVT Prophylaxis  :    Place and maintain sequential compression device Start: 01/17/22 0629 SCDs Start: 01/16/22 2258    Lab Results  Component Value Date   PLT 171 01/21/2022    Diet :  Diet Order             Diet renal with fluid restriction Fluid restriction: 1200 mL Fluid; Room service appropriate? Yes; Fluid consistency: Thin  Diet effective now                    Inpatient Medications  Scheduled Meds:  sodium chloride   Intravenous Once   sodium chloride   Intravenous Once   sodium chloride   Intravenous Once   allopurinol  200 mg Oral Daily   amLODipine  10 mg Oral Daily   Chlorhexidine Gluconate Cloth  6 each Topical Daily   Chlorhexidine Gluconate Cloth  6 each Topical Q0600   cyclobenzaprine  2.5 mg Oral Daily   fluconazole  100 mg Oral Daily   gabapentin  100 mg Oral QHS   hydrALAZINE  50 mg Oral Q8H   metoprolol tartrate  100 mg Oral BID   mupirocin ointment  1 application Nasal BID   pantoprazole  40 mg Oral BID   senna-docusate  2 tablet Oral BID   sevelamer carbonate  1,600 mg Oral TID WC   Continuous Infusions:  vancomycin     PRN Meds:.acetaminophen **OR** acetaminophen, albuterol, hydrALAZINE, ondansetron **OR** ondansetron (ZOFRAN) IV, polyethylene glycol, white petrolatum  Antibiotics  :    Anti-infectives (From admission, onward)    Start     Dose/Rate Route Frequency Ordered Stop  01/20/22 1245  vancomycin (VANCOREADY) IVPB 750 mg/150 mL        750 mg 150 mL/hr over 60 Minutes Intravenous  Once 01/20/22 1232 01/20/22 1609   01/20/22 1000  fluconazole (DIFLUCAN) tablet 100 mg       Note to Pharmacy: 200 mg x1 then 100 mg daily x20 days more   100 mg Oral Daily 01/19/22 1408 02/09/22 0959   01/19/22 1500  fluconazole (DIFLUCAN) tablet 100 mg  Status:  Discontinued       Note to Pharmacy: 200 mg x1 then 100 mg daily x20 days more   100 mg Oral Daily 01/19/22 1404 01/19/22  1408   01/19/22 1500  fluconazole (DIFLUCAN) tablet 200 mg        200 mg Oral  Once 01/19/22 1408 01/19/22 1843   01/19/22 1200  vancomycin (VANCOREADY) IVPB 500 mg/100 mL  Status:  Discontinued        500 mg 100 mL/hr over 60 Minutes Intravenous Every T-Th-Sa (Hemodialysis) 01/16/22 2258 01/17/22 1400   01/19/22 1200  vancomycin (VANCOREADY) IVPB 500 mg/100 mL  Status:  Discontinued        750 mg 150 mL/hr over 60 Minutes Intravenous Every T-Th-Sa (Hemodialysis) 01/17/22 1400 01/17/22 1415   01/19/22 1200  vancomycin (VANCOREADY) IVPB 750 mg/150 mL        750 mg 150 mL/hr over 60 Minutes Intravenous Every T-Th-Sa (Hemodialysis) 01/17/22 1415 02/13/22 2359   01/17/22 2100  vancomycin (VANCOREADY) IVPB 750 mg/150 mL        750 mg 150 mL/hr over 60 Minutes Intravenous  Once 01/17/22 1936 01/17/22 2301   01/17/22 1000  cefTRIAXone (ROCEPHIN) 2 g in sodium chloride 0.9 % 100 mL IVPB  Status:  Discontinued        2 g 200 mL/hr over 30 Minutes Intravenous Every 24 hours 01/17/22 0949 01/19/22 1140   01/17/22 1000  metroNIDAZOLE (FLAGYL) tablet 500 mg  Status:  Discontinued       Note to Pharmacy: Pharmacy can adjust for possible cholecystitis   500 mg Oral Every 12 hours 01/17/22 0949 01/19/22 1140        Time Spent in minutes  30   Lala Lund M.D on 01/21/2022 at 7:17 AM  To page go to [email or web address]   [facility] -  Office  [phone]  See all Orders from today for further details    Objective:   Vitals:   01/20/22 1330 01/20/22 1626 01/20/22 2100 01/21/22 0506  BP: 124/71 (!) 179/91 (!) 157/84 (!) 144/88  Pulse: 90 91 99 83  Resp: (!) 24 18 18 18   Temp: 98.4 F (36.9 C) 98.7 F (37.1 C) 99.1 F (37.3 C) 98.7 F (37.1 C)  TempSrc:    Oral  SpO2: 100% 99% 98% 100%  Weight: 62.6 kg     Height:        Wt Readings from Last 3 Encounters:  01/20/22 62.6 kg  01/13/22 64.9 kg  12/29/21 65 kg     Intake/Output Summary (Last 24 hours) at 01/21/2022  0717 Last data filed at 01/21/2022 0308 Gross per 24 hour  Intake 732 ml  Output 2185 ml  Net -1453 ml     Physical Exam  Awake Alert, x 2 No new F.N deficits, right IJ HD catheter in place [place].AT,PERRAL Supple Neck, No JVD,   Symmetrical Chest wall movement, Good air movement bilaterally, CTAB RRR,No Gallops, Rubs or new Murmurs,  +ve B.Sounds, Abd Soft, No tenderness,   No  Cyanosis, Clubbing or edema    Data Review:    CBC Recent Labs  Lab 01/16/22 1550 01/17/22 0426 01/18/22 0345 01/19/22 0720 01/19/22 1512 01/20/22 0458 01/21/22 0228  WBC 4.3   < > 4.3 3.8* 3.7* 3.8* 4.0  HGB 8.1*   < > 7.6* 7.3* 7.9* 6.8* 8.3*  HCT 26.6*   < > 23.6* 22.4* 24.6* 22.0* 25.8*  PLT 249   < > 234 213 225 222 171  MCV 98.5   < > 95.5 95.3 96.5 100.0 91.8  MCH 30.0   < > 30.8 31.1 31.0 30.9 29.5  MCHC 30.5   < > 32.2 32.6 32.1 30.9 32.2  RDW 15.6*   < > 15.6* 15.5 15.7* 15.9* 16.8*  LYMPHSABS 0.8  --  1.0 0.9  --  0.8 0.7  MONOABS 0.4  --  0.5 0.4  --  0.3 0.5  EOSABS 0.1  --  0.2 0.3  --  0.3 0.1  BASOSABS 0.0  --  0.0 0.0  --  0.0 0.0   < > = values in this interval not displayed.    Electrolytes Recent Labs  Lab 01/16/22 1550 01/16/22 1605 01/17/22 0426 01/17/22 0629 01/17/22 0631 01/18/22 0345 01/19/22 0720 01/19/22 1512 01/20/22 0458 01/21/22 0228  NA  --   --  136  --   --  135 135 135 134* 137  K  --   --  3.2*  --   --  4.4 4.0 4.0 4.7 3.7  CL  --   --  96*  --   --  96* 97* 97* 98 99  CO2  --   --  30  --   --  28 25 25 23 28   GLUCOSE  --   --  88  --   --  91 90 92 85 103*  BUN  --   --  12  --   --  20 29* 30* 32* 10  CREATININE  --   --  3.33*  --   --  4.63* 5.96* 6.21* 6.88* 3.95*  CALCIUM  --   --  8.8*  --   --  10.0 10.2 9.8 9.6 9.3  AST  --   --  13*  --   --  15 13*  --  15 64*  ALT  --   --  9  --   --  9 8  --  8 19  ALKPHOS  --   --  100  --   --  92 82  --  77 165*  BILITOT  --   --  0.5  --   --  0.5 0.5  --  0.6 0.6  ALBUMIN  --   --  2.0*   --   --  2.0* 2.1* 2.3* 2.1* 2.1*  MG  --   --  2.0  --   --  2.0 2.0  --  2.0 1.9  CRP   < >  --  13.4* 13.1*  --  12.5* 9.6*  --  7.2* 6.2*  DDIMER  --   --   --   --  3.58*  --   --   --   --   --   LATICACIDVEN  --  1.7  --   --   --   --   --   --   --   --   INR  --  1.3*  --   --  1.2  --   --   --   --   --   BNP  --   --   --   --   --  1,628.1* 1,446.7*  --  943.3* 760.8*   < > = values in this interval not displayed.    ------------------------------------------------------------------------------------------------------------------ No results for input(s): CHOL, HDL, LDLCALC, TRIG, CHOLHDL, LDLDIRECT in the last 72 hours.  Lab Results  Component Value Date   HGBA1C 5.4 10/18/2019    No results for input(s): TSH, T4TOTAL, T3FREE, THYROIDAB in the last 72 hours.  Invalid input(s): FREET3 ------------------------------------------------------------------------------------------------------------------ ID Labs Recent Labs  Lab 01/16/22 1605 01/17/22 0426 01/17/22 0629 01/17/22 0631 01/17/22 1449 01/18/22 0345 01/19/22 0720 01/19/22 1512 01/20/22 0458 01/21/22 0228  WBC  --    < >  --  4.1   < > 4.3 3.8* 3.7* 3.8* 4.0  PLT  --    < >  --  234  235   < > 234 213 225 222 171  CRP  --    < > 13.1*  --   --  12.5* 9.6*  --  7.2* 6.2*  DDIMER  --   --   --  3.58*  --   --   --   --   --   --   LATICACIDVEN 1.7  --   --   --   --   --   --   --   --   --   CREATININE  --    < >  --   --   --  4.63* 5.96* 6.21* 6.88* 3.95*   < > = values in this interval not displayed.   Cardiac Enzymes No results for input(s): CKMB, TROPONINI, MYOGLOBIN in the last 168 hours.  Invalid input(s): CK   Radiology Reports NM Hepato W/EF  Result Date: 01/18/2022 CLINICAL DATA:  Recurrent biliary colic EXAM: NUCLEAR MEDICINE HEPATOBILIARY IMAGING WITH GALLBLADDER EF TECHNIQUE: Sequential images of the abdomen were obtained out to 60 minutes following intravenous administration of  radiopharmaceutical. After oral ingestion of Ensure, gallbladder ejection fraction was determined. At 60 min, normal ejection fraction is greater than 33%. RADIOPHARMACEUTICALS:  5.4 mCi Tc-65m  Choletec IV COMPARISON:  MRCP 01/17/2022 FINDINGS: Prompt uptake and biliary excretion of activity by the liver is seen. Gallbladder activity is visualized, consistent with patency of cystic duct. Biliary activity passes into small bowel, consistent with patent common bile duct. Calculated gallbladder ejection fraction is 21 %. (Normal gallbladder ejection fraction with Ensure is greater than 33%.) IMPRESSION: 1. Patent cystic duct without evidence for acute cholecystitis. 2. Decreased gallbladder ejection fraction at 21% which may reflect underlying biliary dyskinesis. Electronically Signed   By: Kerby Moors M.D.   On: 01/18/2022 12:35

## 2022-01-22 ENCOUNTER — Other Ambulatory Visit: Payer: Self-pay | Admitting: Internal Medicine

## 2022-01-22 ENCOUNTER — Other Ambulatory Visit (HOSPITAL_COMMUNITY): Payer: Self-pay

## 2022-01-22 LAB — CBC WITH DIFFERENTIAL/PLATELET
Abs Immature Granulocytes: 0.03 10*3/uL (ref 0.00–0.07)
Basophils Absolute: 0 10*3/uL (ref 0.0–0.1)
Basophils Relative: 1 %
Eosinophils Absolute: 0.2 10*3/uL (ref 0.0–0.5)
Eosinophils Relative: 5 %
HCT: 27.4 % — ABNORMAL LOW (ref 36.0–46.0)
Hemoglobin: 9 g/dL — ABNORMAL LOW (ref 12.0–15.0)
Immature Granulocytes: 1 %
Lymphocytes Relative: 22 %
Lymphs Abs: 1 10*3/uL (ref 0.7–4.0)
MCH: 30.3 pg (ref 26.0–34.0)
MCHC: 32.8 g/dL (ref 30.0–36.0)
MCV: 92.3 fL (ref 80.0–100.0)
Monocytes Absolute: 0.4 10*3/uL (ref 0.1–1.0)
Monocytes Relative: 10 %
Neutro Abs: 2.6 10*3/uL (ref 1.7–7.7)
Neutrophils Relative %: 61 %
Platelets: 181 10*3/uL (ref 150–400)
RBC: 2.97 MIL/uL — ABNORMAL LOW (ref 3.87–5.11)
RDW: 16.5 % — ABNORMAL HIGH (ref 11.5–15.5)
WBC: 4.2 10*3/uL (ref 4.0–10.5)
nRBC: 0 % (ref 0.0–0.2)

## 2022-01-22 MED ORDER — FLUCONAZOLE 100 MG PO TABS
100.0000 mg | ORAL_TABLET | Freq: Every day | ORAL | 0 refills | Status: AC
  Filled 2022-01-22: qty 17, 17d supply, fill #0

## 2022-01-22 MED ORDER — VANCOMYCIN HCL 1000 MG IV SOLR: 750.0000 mg | INTRAVENOUS | 0 refills | Status: AC

## 2022-01-22 MED ORDER — PANTOPRAZOLE SODIUM 40 MG PO TBEC
40.0000 mg | DELAYED_RELEASE_TABLET | Freq: Two times a day (BID) | ORAL | 0 refills | Status: DC
  Filled 2022-01-22: qty 60, 30d supply, fill #0

## 2022-01-22 MED ORDER — TRAMADOL HCL 50 MG PO TABS
50.0000 mg | ORAL_TABLET | Freq: Two times a day (BID) | ORAL | Status: DC | PRN
  Administered 2022-01-22: 50 mg via ORAL
  Filled 2022-01-22: qty 1

## 2022-01-22 NOTE — Care Management Important Message (Signed)
Important Message  Patient Details  Name: Diamond [name] MRN: [id number] Date of Birth: 17-Nov-1951   Medicare Important Message Given:  Yes     Orbie Pyo 01/22/2022, 9:14 AM

## 2022-01-22 NOTE — Progress Notes (Signed)
Physical Therapy Treatment Patient Details Name: Diamond [name] MRN: [id number] DOB: July 19, 1951 Today's Date: 01/22/2022   History of Present Illness Pt is a 71 y/o female admitted from [facility] 01/16/22 with abdominal pain. Workup for acute on chronic anemia, possible cholecystitis vs gallbladder mass. CT negative for active GI bleed. 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 PMH includes ESRD on HD, asthma, HTN, anemia, a fib.    PT Comments    Session focused on family education and training in preparation to return home. Educated and demonstrated to daughter and son about squat pivot transfer techniques. Son was able to demonstrate good body mechanics and technique to transfer patient to bed and recliner with maxA. Daughter attempted but unable to complete. Educated on increasing bed/chair height or use +2 method which therapist demonstrated. Answered all questions from son and daughter as well as daughter via phone call. Encouraged family for patient to be up for all meals and every 1-2 hour toileting schedule, family in agreement. Instructed patient and family on seated exercises to assist in improving LE strength. Patient will need hospital bed, RW, BSC(3in1), and wheelchair/cushion to return home as well as HHPT for continued mobility progression. Family in agreement and eager for patient to return home.    Recommendations for follow up therapy are one component of a multi-disciplinary discharge planning process, led by the attending physician.  Recommendations may be updated based on patient status, additional functional criteria and insurance authorization.  Follow Up Recommendations  Home health PT (per family request)     Assistance Recommended at Discharge Frequent or constant Supervision/Assistance  Patient can return home with the following A lot of help with walking  and/or transfers;A lot of help with bathing/dressing/bathroom;Assistance with cooking/housework;Assist for transportation;Help with stairs or ramp for entrance;Direct supervision/assist for financial management;Direct supervision/assist for medications management   Equipment Recommendations  BSC/3in1;Wheelchair (measurements PT);Wheelchair cushion (measurements PT);Hospital bed    Recommendations for Other Services       Precautions / Restrictions Precautions Precautions: Fall Restrictions Weight Bearing Restrictions: No     Mobility  Bed Mobility Overal bed mobility: Needs Assistance Bed Mobility: Supine to Sit     Supine to sit: Mod assist     General bed mobility comments: modA for trunk elevation to EOB due to pain    Transfers Overall transfer level: Needs assistance Equipment used: None Transfers: Bed to chair/wheelchair/BSC       Squat pivot transfers: Max assist     General transfer comment: Therapist demonstrated squat pivot to son and daughter with maxA to boost buttocks off bed and pivot to chair. Son demonstrated squat pivot to bed with maxA but patient able to take steps over to bed. Daughter attempted to complete but unable to with maxA from low bed surface. Educated daughter to boost height of bed/chair at home to assist or use +2 assist. Demonstrated +2 assist with family members. Son able to transfer patient back to chair with maxA    Ambulation/Gait                   Stairs             Wheelchair Mobility    Modified Rankin (Stroke Patients Only)       Balance Overall balance assessment: Needs assistance Sitting-balance support: Feet supported Sitting balance-Leahy Scale: Good     Standing balance support: Bilateral upper extremity supported, During functional activity  Standing balance-Leahy Scale: Poor                              Cognition Arousal/Alertness: Awake/alert Behavior During Therapy: Flat  affect Overall Cognitive Status: Impaired/Different from baseline Area of Impairment: Attention, Memory, Following commands, Safety/judgement, Awareness, Problem solving                   Current Attention Level: Sustained Memory: Decreased short-term memory Following Commands: Follows one step commands with increased time, Follows one step commands inconsistently Safety/Judgement: Decreased awareness of safety, Decreased awareness of deficits Awareness: Intellectual Problem Solving: Slow processing, Decreased initiation, Requires verbal cues, Requires tactile cues          Exercises General Exercises - Lower Extremity Long Arc Quad: Both, 10 reps, Seated Hip ABduction/ADduction: Both, 5 reps, Seated Hip Flexion/Marching: Both, 10 reps, Seated Toe Raises: Both, 5 reps, Seated Heel Raises: Both, 5 reps, Seated    General Comments        Pertinent Vitals/Pain Pain Assessment Pain Assessment: Faces Faces Pain Scale: Hurts little more Pain Location: low back Pain Descriptors / Indicators: Discomfort, Grimacing Pain Intervention(s): Monitored during session    Home Living                          Prior Function            PT Goals (current goals can now be found in the care plan section) Acute Rehab PT Goals Patient Stated Goal: to go home PT Goal Formulation: With patient Time For Goal Achievement: 02/01/22 Potential to Achieve Goals: Fair Progress towards PT goals: Progressing toward goals    Frequency    Min 3X/week      PT Plan Current plan remains appropriate    Co-evaluation              AM-PAC PT "6 Clicks" Mobility   Outcome Measure  Help needed turning from your back to your side while in a flat bed without using bedrails?: A Little Help needed moving from lying on your back to sitting on the side of a flat bed without using bedrails?: A Lot Help needed moving to and from a bed to a chair (including a wheelchair)?:  Total Help needed standing up from a chair using your arms (e.g., wheelchair or bedside chair)?: Total Help needed to walk in hospital room?: Total Help needed climbing 3-5 steps with a railing? : Total 6 Click Score: 9    End of Session Equipment Utilized During Treatment: Gait belt Activity Tolerance: Patient tolerated treatment well Patient left: in chair;with call bell/phone within reach;with family/visitor present Nurse Communication: Mobility status PT Visit Diagnosis: Muscle weakness (generalized) (M62.81);Other abnormalities of gait and mobility (R26.89)     Time: 1017-1110 PT Time Calculation (min) (ACUTE ONLY): 53 min  Charges:  $Therapeutic Activity: 53-67 mins                     Kinzee Happel A. Gilford Rile PT, DPT Acute Rehabilitation Services Pager [phone] Office [phone]    Linna Hoff 01/22/2022, 11:20 AM

## 2022-01-22 NOTE — Discharge Instructions (Signed)
Follow with Primary MD [name], MD in 7 days   Get CBC, CMP, 2 view Chest X ray -  checked next visit within 1 week by Primary MD    Activity: As tolerated with Full fall precautions use walker/cane & assistance as needed  Disposition Home   Diet: Renal diet with 1.2 L fluid restriction per day  Special Instructions: If you have smoked or chewed Tobacco  in the last 2 yrs please stop smoking, stop any regular Alcohol  and or any Recreational drug use.  On your next visit with your primary care physician please Get Medicines reviewed and adjusted.  Please request your Prim.MD to go over all Hospital Tests and Procedure/Radiological results at the follow up, please get all Hospital records sent to your Prim MD by signing hospital release before you go home.  If you experience worsening of your admission symptoms, develop shortness of breath, life threatening emergency, suicidal or homicidal thoughts you must seek medical attention immediately by calling 911 or calling your MD immediately  if symptoms less severe.  You Must read complete instructions/literature along with all the possible adverse reactions/side effects for all the Medicines you take and that have been prescribed to you. Take any new Medicines after you have completely understood and accpet all the possible adverse reactions/side effects.

## 2022-01-22 NOTE — Progress Notes (Signed)
AVS given and explained to patient. Awaiting for DME.

## 2022-01-22 NOTE — Progress Notes (Signed)
[facility] Progress Note   Subjective:   Patient seen and examined in room. Open for d/c today. Tolerated HD yesterday, net uf 1878cc. No complaints.  Objective Vitals:   01/21/22 1700 01/22/22 0246 01/22/22 0538 01/22/22 0921  BP: (!) 142/86 (!) 147/92 (!) 155/98 134/77  Pulse: 82 94 87 86  Resp: 16 18 19 18   Temp: 98.6 F (37 C) 99.6 F (37.6 C) 98.6 F (37 C) 98.4 F (36.9 C)  TempSrc: Oral  Oral Oral  SpO2: 98% 100% 96% 99%  Weight:      Height:       Physical Exam General:well appearing female in NAD, sitting in chair Heart:RRR, no mrg Lungs:CTAB, nml WOB onRA Abdomen:soft, NTND Extremities:no LE edema Dialysis Access: TDC in use, maturing LU AVF +b/t   Filed Weights   01/20/22 1330 01/21/22 0736 01/21/22 1104  Weight: 62.6 kg 61 kg 60.2 kg    Intake/Output Summary (Last 24 hours) at 01/22/2022 1113 Last data filed at 01/22/2022 0650 Gross per 24 hour  Intake 750 ml  Output 500 ml  Net 250 ml    Additional Objective Labs: Basic Metabolic Panel: Recent Labs  Lab 01/19/22 1512 01/20/22 0458 01/21/22 0228  NA 135 134* 137  K 4.0 4.7 3.7  CL 97* 98 99  CO2 25 23 28   GLUCOSE 92 85 103*  BUN 30* 32* 10  CREATININE 6.21* 6.88* 3.95*  CALCIUM 9.8 9.6 9.3  PHOS 5.5*   5.6*  --   --    Liver Function Tests: Recent Labs  Lab 01/19/22 0720 01/19/22 1512 01/20/22 0458 01/21/22 0228  AST 13*  --  15 64*  ALT 8  --  8 19  ALKPHOS 82  --  77 165*  BILITOT 0.5  --  0.6 0.6  PROT 6.2*  --  6.1* 6.4*  ALBUMIN 2.1* 2.3* 2.1* 2.1*   Recent Labs  Lab 01/16/22 1550  LIPASE 42   CBC: Recent Labs  Lab 01/19/22 0720 01/19/22 1512 01/20/22 0458 01/21/22 0228 01/22/22 0408  WBC 3.8* 3.7* 3.8* 4.0 4.2  NEUTROABS 2.1  --  2.4 2.7 2.6  HGB 7.3* 7.9* 6.8* 8.3* 9.0*  HCT 22.4* 24.6* 22.0* 25.8* 27.4*  MCV 95.3 96.5 100.0 91.8 92.3  PLT 213 225 222 171 181   Blood Culture    Component Value Date/Time   SDES BLOOD RIGHT ARM 01/16/2022  1830   SPECREQUEST  01/16/2022 1830    BOTTLES DRAWN AEROBIC ONLY Blood Culture results may not be optimal due to an inadequate volume of blood received in culture bottles   CULT  01/16/2022 1830    NO GROWTH 5 DAYS Performed at [facility], [address], [place], [place] [address]    REPTSTATUS 01/21/2022 FINAL 01/16/2022 1830     Medications:  vancomycin Stopped (01/21/22 1201)    allopurinol  200 mg Oral Daily   amLODipine  10 mg Oral Daily   apixaban  5 mg Oral BID   Chlorhexidine Gluconate Cloth  6 each Topical Daily   Chlorhexidine Gluconate Cloth  6 each Topical Q0600   cyclobenzaprine  2.5 mg Oral Daily   fluconazole  100 mg Oral Daily   gabapentin  100 mg Oral QHS   hydrALAZINE  50 mg Oral Q8H   metoprolol tartrate  100 mg Oral BID   mupirocin ointment  1 application Nasal BID   pantoprazole  40 mg Oral BID   senna-docusate  2 tablet Oral  BID   sevelamer carbonate  1,600 mg Oral TID WC    Dialysis Orders: Unit: [facility] TTS  Time: 4:00  EDW: 62 kg  Flows: 400  Bath: 2K/2Ca  Access: TDC/Maturing AVF  Heparin: Bolus 2000  ESA: Mircera 50  q 2 wks (last 01/07/22) VDRA: Hectorol 3 TIW    Assessment/Plan: ESRD -  HD TTS. Continue on schedule. HD tomorrow if still here Abd pain --Cholelithiasis/Gallbladder mass on MRI/MRCP. GI consulting - HIDA scan with no evidence acute cholecystitis and decreased GB EF 21%. Gen surgery consulted - no plans for surgery at this time especially since she is being treated for endocarditis. Will require outpatient gen surg follow up for cholecystectomy with possible lymphadenectomy and central liver resection GIB - GI following. EGD/colonoscopy 2/14-no active bleed Hypertension/volume  - Blood pressure elevated.  Continue home meds. BP well controlled as outpatient. No volume excess on exam.  Acute on chronic Anemia of CKD. Suspected GIB-egd/cscope w/ no active bleed. GI following.  Transfuse prn. Hgb stable 9 today Metabolic bone  disease - Corrected calcium elevated.  Use low ca bath.  Hold VDRA.  c/w renvela & low phos diet MRSA bacteremia/discitis/endocarditis - prior to admission. On vancomycin q HD until 02/13/22. Subtherapeutic vanc level at outpatient dialysis on 2/7 --Increased Vanc to 750mg  q HD--until 3/11 Atrial flutter - Eliquis back on Right pulmonary nodule-per pmd Nutrition - Renal diet w/fluid restrictions when advanced.   Gean Quint, MD [facility] 01/22/2022,11:13 AM  LOS: 5 days

## 2022-01-22 NOTE — Progress Notes (Signed)
Encounter opened in error

## 2022-01-22 NOTE — Progress Notes (Signed)
Contacted [facility]. Gerald Stabs, RN aware pt for d/c to home today and will resume care tomorrow. Pt's schedule is TTS with 11:05 arrival for 11:20 chair time. HD appt added to pt's AVS.   Melven Sartorius Renal Navigator [phone]

## 2022-01-22 NOTE — Discharge Summary (Signed)
Diamond [name] FSF:[id number] DOB: 02-Dec-1951 DOA: 01/16/2022  PCP: Diamond Mccreedy, MD  Admit date: 01/16/2022  Discharge date: 01/22/2022  Admitted From: Home   Disposition:  Home   Recommendations for Outpatient Follow-up:   Follow up with PCP in 1-2 weeks  PCP Please obtain BMP/CBC, 2 view CXR in 1week,  (see Discharge instructions)   PCP Please follow up on the following pending results:    Home Health: PT-OT   Equipment/Devices: None  Consultations: CCS, GI, Renal Discharge Condition: Fair   CODE STATUS: Full    Diet Recommendation: Renal diet with 1200 cc fluid restriction per day.  Chief Complaint  Patient presents with   Abdominal Pain     Brief history of present illness from the day of admission and additional interim summary    71 year old African-American female with known past medical history of end-stage renal disease (HD Tues, Thurs, Sat), asthma, hypertension, anemia of chronic disease, atrial flutter (on eliquis) and recent diagnosis of suspected infective endocarditis with right atrial mass as well as MRSA bacteremia with lumbar discitis who presents to [facility] emergency department via EMS with complaints of abdominal pain.   In the ER was suggestive of Hemoccult positive stool, acute on chronic anemia along with possible cholecystitis and she was admitted.                                                                 Hospital Course      Acute abdominal pain mostly in the right upper quadrant.  Suspicious for acute or subacute cholecystitis - MRCP has been noted with possible cholecystitis and gallbladder mass -  GI and general surgery both were consulted and saw the patient, she was placed on empiric IV Rocephin and Flagyl, she underwent HIDA scan which was unremarkable,  EF on HIDA scan though was slightly compromised.  General surgery has signed off and have no further recommendations, will stop antibiotics on 01/19/2022 and monitor on PO diet.  Will require outpatient general surgery follow-up postdischarge for possible gallbladder mass.   2.  Acute anemia with GI blood loss on top of anemia of chronic disease in the ESRD patient.  Baseline hemoglobin seems to be around 10, she was recently placed on Eliquis, seen by GI underwent EGD and colonoscopy on 01/19/2022 with no active bleeding, nonspecific esophageal plaques and Candida esophagitis noted, some polyps removed during colonoscopy, no active bleeding.  Hemoglobin unfortunately has dropped below 7 on 01/20/2022 as she will receive 1 unit of packed RBC but I think there is a large element of heme dilution and it due to missed dialysis yesterday caused by busy HD schedule.  No signs of active bleeding.  Continue PPI, H&H appears stable posttransfusion request was resumed on 01/21/2022 has tolerated well and H&H is  trending up will be discharged home on PPI with outpatient GI and general surgery follow-up.  3.  Acute infective endocarditis and lumbar discitis with MRSA bacteremia diagnosed recently.  On IV vancomycin with dialysis treatments.  Stop date 02/13/2022.  Chronic back pain and stiffness continue supportive care for that.  Home PT OT.   4.  ESRD.  TTS schedule nephrology was consulted .   5. Atrial flutter with [place] vas 2 score of greater than 3.  Have doubled Lopressor dose, Eliquis resumed on 01/21/2022.   6.  Hypertension.  Beta-blocker dose doubled, Norvasc and hydralazine.   7.  History of gout.  On allopurinol continue.   8. Candida esophagitis - Diflucan (fluconazole) 200 mg x1 then 100 mg PO daily for 3 weeks. Stop date 02/08/2022.   Discharge diagnosis     Principal Problem:   Acute blood loss anemia Active Problems:   Essential hypertension   Hypokalemia   ESRD on hemodialysis (HCC)   Atrial  flutter (HCC)   Acute infective endocarditis   Septic discitis of lumbar region   Gallbladder mass   Generalized abdominal pain   Multiple polyps of sigmoid colon   Heme positive stool   Duodenitis   Candida esophagitis ([name])    Discharge instructions    Discharge Instructions     Discharge instructions   Complete by: As directed    Follow with Primary MD Diamond Mccreedy, MD in 7 days   Get CBC, CMP, 2 view Chest X ray -  checked next visit within 1 week by Primary MD    Activity: As tolerated with Full fall precautions use walker/cane & assistance as needed  Disposition Home   Diet: Renal diet with 1.2 L fluid restriction per day  Special Instructions: If you have smoked or chewed Tobacco  in the last 2 yrs please stop smoking, stop any regular Alcohol  and or any Recreational drug use.  On your next visit with your primary care physician please Get Medicines reviewed and adjusted.  Please request your Prim.MD to go over all Hospital Tests and Procedure/Radiological results at the follow up, please get all Hospital records sent to your Prim MD by signing hospital release before you go home.  If you experience worsening of your admission symptoms, develop shortness of breath, life threatening emergency, suicidal or homicidal thoughts you must seek medical attention immediately by calling 911 or calling your MD immediately  if symptoms less severe.  You Must read complete instructions/literature along with all the possible adverse reactions/side effects for all the Medicines you take and that have been prescribed to you. Take any new Medicines after you have completely understood and accpet all the possible adverse reactions/side effects.   Increase activity slowly   Complete by: As directed    No wound care   Complete by: As directed        Discharge Medications   Allergies as of 01/22/2022       Reactions   Shrimp (diagnostic) Other (See Comments)   Listed on [facility]         Medication List     STOP taking these medications    vancomycin 500 MG/100ML IVPB Commonly known as: VANCOREADY       TAKE these medications    acetaminophen 500 MG tablet Commonly known as: TYLENOL Take 1,000 mg by mouth See admin instructions. 1000 mg twice daily scheduled. May take an additional 1000 mg daily as needed for pain.   albuterol 108 (90  Base) MCG/ACT inhaler Commonly known as: VENTOLIN HFA Inhale 2 puffs into the lungs every 4 (four) hours as needed for wheezing or shortness of breath.   allopurinol 100 MG tablet Commonly known as: ZYLOPRIM Take 200 mg by mouth daily.   apixaban 5 MG Tabs tablet Commonly known as: ELIQUIS Take 1 tablet (5 mg total) by mouth 2 (two) times daily.   colchicine 0.6 MG tablet Take 0.6 mg by mouth daily as needed (gout).   cyclobenzaprine 5 MG tablet Commonly known as: FLEXERIL Take 2.5 mg by mouth daily.   eucerin cream Apply 1 application topically daily.   fluconazole 100 MG tablet Commonly known as: DIFLUCAN Take 1 tablet (100 mg total) by mouth daily for 17 days.   gabapentin 100 MG capsule Commonly known as: NEURONTIN Take 100 mg by mouth at bedtime.   hydrALAZINE 50 MG tablet Commonly known as: APRESOLINE Take 1 tablet (50 mg total) by mouth every 8 (eight) hours.   hydrOXYzine 25 MG tablet Commonly known as: ATARAX Take 12.5 mg by mouth every 6 (six) hours as needed for itching. For 12 days   Lidocaine 4 % Ptch Apply 1 patch topically daily.   metoprolol tartrate 50 MG tablet Commonly known as: LOPRESSOR Take 1 tablet (50 mg total) by mouth 2 (two) times daily.   ondansetron 4 MG tablet Commonly known as: ZOFRAN Take 4 mg by mouth every 8 (eight) hours as needed.   oxyCODONE 5 MG immediate release tablet Commonly known as: Oxy IR/ROXICODONE Take 1 tablet (5 mg total) by mouth every 6 (six) hours as needed for severe pain.   pantoprazole 40 MG tablet Commonly known as: PROTONIX Take 1  tablet (40 mg total) by mouth 2 (two) times daily.   polyethylene glycol 17 g packet Commonly known as: MIRALAX / GLYCOLAX Take 17 g by mouth daily as needed for mild constipation.   senna-docusate 8.6-50 MG tablet Commonly known as: Senokot-S Take 2 tablets by mouth 2 (two) times daily.   sevelamer carbonate 800 MG tablet Commonly known as: RENVELA Take 2 tablets (1,600 mg total) by mouth 3 (three) times daily with meals. What changed:  how much to take when to take this additional instructions   vancomycin 750 mg in sodium chloride 0.9 % 150 mL Inject 750 mg into the vein Every Tuesday,Thursday,and Saturday with dialysis for 25 days. Stop date 02/13/22         Follow-up Information     Dwan Bolt, MD Follow up on 02/16/2022.   Specialty: General Surgery Why: 9:40 am, arrive by 9:10am for paperwork and check in process Contact information: [place]. [address] [place] [address] [phone]         Diamond Mccreedy, MD. Schedule an appointment as soon as possible for a visit in 1 week(s).   Specialty: Internal Medicine Contact information: [address] [place] [address] [phone]         Freada Bergeron, MD. Schedule an appointment as soon as possible for a visit in 2 week(s).   Specialties: Cardiology, Radiology Contact information: [address] [place] [address] [phone]         Lavena Bullion, DO. Schedule an appointment as soon as possible for a visit in 1 week(s).   Specialty: Gastroenterology Contact information: [place] [address] [phone]  Major procedures and Radiology Reports - PLEASE review detailed and final reports thoroughly  -       MR ABDOMEN MRCP WO CONTRAST  Result Date: 01/17/2022 CLINICAL DATA:  71 year old female with history of abdominal pain and back pain. Possible cholecystitis noted on  recent CT examination, in addition to mass-like thickening of the gallbladder fundus. Further evaluation. EXAM: MRI ABDOMEN WITHOUT CONTRAST  (INCLUDING MRCP) TECHNIQUE: Multiplanar multisequence MR imaging of the abdomen was performed. Heavily T2-weighted images of the biliary and pancreatic ducts were obtained, and three-dimensional MRCP images were rendered by post processing. COMPARISON:  No prior abdominal MRI. CTA of the abdomen and pelvis 01/16/2022. FINDINGS: Comment: Today's study is limited for detection and characterization of visceral and/or vascular lesions by lack of IV gadolinium. Lower chest: Unremarkable. Hepatobiliary: No definite suspicious cystic or solid hepatic lesions are confidently identified on today's noncontrast examination. There is diffuse low signal intensity throughout the hepatic parenchyma on in phase dual echo images, as well as on T2 weighted sequences, suggesting substantial hepatic iron deposition. No intrahepatic biliary ductal dilatation. Common bile duct is mildly dilated measuring 9 mm in the porta hepatis. No filling defect within the common bile duct to suggest choledocholithiasis. There are numerous filling defects within the gallbladder, largest of which appears impacted in the neck of the gallbladder measuring up to 2.8 cm in diameter. Associated with this there is diffuse gallbladder wall thickening and edema, along with a trace amount of pericholecystic fluid. Notably, there is mass-like thickening of the fundus of the gallbladder (axial image 22 of series 4 and coronal image 23 of series 5) which is heterogeneous in signal intensity on T1 and T2 weighted images and demonstrates internal areas of diffusion restriction, concerning for gallbladder neoplasm (poorly evaluated on today's noncontrast examination). Pancreas: No definite pancreatic mass confidently identified on today's noncontrast examination. Diffuse pancreatic ductal dilatation is noted, with the  pancreatic duct measuring up to 5 mm in the body of the pancreas. No peripancreatic fluid collections or inflammatory changes. Spleen: Diffuse low signal intensity throughout the splenic parenchyma on in phase dual echo images and T2 weighted sequences, indicative of splenic iron deposition. Adrenals/Urinary Tract: Numerous T1 hypointense, T2 hyperintense lesions are noted in the kidneys bilaterally, incompletely characterized on today's noncontrast examination, but likely to represent cysts, largest of which is exophytic measuring up to 2.6 cm in the anterolateral aspect of the lower pole of the left kidney. No hydroureteronephrosis in the visualized portions of the abdomen. Bilateral adrenal glands are normal in appearance. Stomach/Bowel: Visualized portions are unremarkable. Vascular/Lymphatic: No aneurysm identified in the visualized abdominal vasculature. Borderline enlarged portacaval lymph node measuring 1 cm in short axis (axial image 17 of series 4) and hepatoduodenal ligament lymph node (axial image 14 of series 4) measuring 1.2 cm in short axis. Other: No significant volume of ascites noted in the visualized portions of the peritoneal cavity. Small fat containing umbilical hernia. Musculoskeletal: No aggressive appearing osseous lesions are noted in the visualized portions of the skeleton. IMPRESSION: 1. Cholelithiasis with evidence suggestive of acute cholecystitis. 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 Prominent borderline enlarged portacaval and hepatoduodenal ligament lymph nodes are noted. Attention to these at time of cholecystectomy is recommended, as the possibility of metastatic disease is not excluded. 2. Hepatic and splenic hemosiderosis. 3. Dilatation of the common bile duct without intrahepatic biliary ductal dilatation. This could be benign and age-related.  No obstructing  choledocholithiasis identified on today's examination. Additionally, there is no definite obstructing lesion in the pancreatic head or region of the ampulla. Notably, however, there is also some mild diffuse pancreatic ductal dilatation. If there is clinical concern for subtle neoplasm in the region of the pancreatic head or ampulla, repeat abdominal MRI with and without IV gadolinium with MRCP could be considered. Please note, however, that per report from the technologist, the patient refused to follow commands and refused to finish today's examination. Electronically Signed   By: Vinnie Langton M.D.   On: 01/17/2022 06:13   NM Hepato W/EF  Result Date: 01/18/2022 CLINICAL DATA:  Recurrent biliary colic EXAM: NUCLEAR MEDICINE HEPATOBILIARY IMAGING WITH GALLBLADDER EF TECHNIQUE: Sequential images of the abdomen were obtained out to 60 minutes following intravenous administration of radiopharmaceutical. After oral ingestion of Ensure, gallbladder ejection fraction was determined. At 60 min, normal ejection fraction is greater than 33%. RADIOPHARMACEUTICALS:  5.4 mCi Tc-38m  Choletec IV COMPARISON:  MRCP 01/17/2022 FINDINGS: Prompt uptake and biliary excretion of activity by the liver is seen. Gallbladder activity is visualized, consistent with patency of cystic duct. Biliary activity passes into small bowel, consistent with patent common bile duct. Calculated gallbladder ejection fraction is 21 %. (Normal gallbladder ejection fraction with Ensure is greater than 33%.) IMPRESSION: 1. Patent cystic duct without evidence for acute cholecystitis. 2. Decreased gallbladder ejection fraction at 21% which may reflect underlying biliary dyskinesis. Electronically Signed   By: Kerby Moors M.D.   On: 01/18/2022 12:35   DG Chest Port 1 View  Result Date: 01/16/2022 CLINICAL DATA:  Sepsis EXAM: PORTABLE CHEST 1 VIEW COMPARISON:  12/26/2021 FINDINGS: Stable positioning of right IJ central line. Stable  cardiomediastinal contours. No focal airspace consolidation, pleural effusion, or pneumothorax. IMPRESSION: No active disease. Electronically Signed   By: Davina Poke D.O.   On: 01/16/2022 17:24   DG CHEST PORT 1 VIEW  Result Date: 12/26/2021 CLINICAL DATA:  Hypotension. EXAM: PORTABLE CHEST 1 VIEW COMPARISON:  12/23/2021 FINDINGS: 1525 hours. The lungs are clear without focal pneumonia, edema, pneumothorax or pleural effusion. The cardiopericardial silhouette is within normal limits for size. Right IJ central line tip overlies the upper right atrium. Bones are diffusely demineralized. IMPRESSION: 1. No acute cardiopulmonary findings. 2. Right IJ central line tip overlies the upper right atrium. Electronically Signed   By: Misty Stanley M.D.   On: 12/26/2021 15:30   DG Chest Port 1 View  Result Date: 12/23/2021 CLINICAL DATA:  Status post dialysis catheter placement EXAM: PORTABLE CHEST 1 VIEW COMPARISON:  12/18/2011 FINDINGS: Right jugular dialysis catheter is noted in satisfactory position. Cardiac shadow is mildly enlarged but stable. Tortuous thoracic aorta is noted. Lungs are clear. No pneumothorax is noted. IMPRESSION: No acute abnormality following dialysis catheter placement. Electronically Signed   By: Inez Catalina M.D.   On: 12/23/2021 19:09   DG C-Arm 1-60 Min-No Report  Result Date: 12/23/2021 Fluoroscopy was utilized by the requesting physician.  No radiographic interpretation.   CT Angio Abd/Pel W and/or Wo Contrast  Result Date: 01/16/2022 CLINICAL DATA:  Lower GI bleed. EXAM: CTA ABDOMEN AND PELVIS WITHOUT AND WITH CONTRAST TECHNIQUE: Multidetector CT imaging of the abdomen and pelvis was performed using the standard protocol during bolus administration of intravenous contrast. Multiplanar reconstructed images and MIPs were obtained and reviewed to evaluate the vascular anatomy. RADIATION DOSE REDUCTION: This exam was performed according to the departmental dose-optimization  program which includes automated exposure control, adjustment of the mA  and/or kV according to patient size and/or use of iterative reconstruction technique. CONTRAST:  84mL OMNIPAQUE IOHEXOL 350 MG/ML SOLN COMPARISON:  CT abdomen pelvis dated 12/17/2021. FINDINGS: VASCULAR Aorta: Mild atherosclerotic calcification. No aneurysmal dilatation or dissection. The aorta is tortuous. Celiac: Patent without evidence of aneurysm, dissection, vasculitis or significant stenosis. SMA: Patent without evidence of aneurysm, dissection, vasculitis or significant stenosis. Renals: Both renal arteries are patent without evidence of aneurysm, dissection, vasculitis, fibromuscular dysplasia or significant stenosis. IMA: Patent without evidence of aneurysm, dissection, vasculitis or significant stenosis. Inflow: Mild atherosclerotic calcification. No aneurysmal dilatation or dissection. The iliac arteries are patent. Proximal Outflow: Bilateral common femoral and visualized portions of the superficial and profunda femoral arteries are patent without evidence of aneurysm, dissection, vasculitis or significant stenosis. Veins: The IVC is unremarkable.  No portal venous gas. Review of the MIP images confirms the above findings. NON-VASCULAR Lower chest: There is a 9 mm right middle lobe nodule. Follow-up as per recommendation of CT of 12/17/2021. The visualized lung bases are otherwise clear. No intra-abdominal free air or free fluid. Hepatobiliary: The liver is unremarkable. No intrahepatic biliary dilatation. Several noncalcified gallstones measure up to 2.7 cm. There is a 3.1 x 3.4 cm masslike thickening of the gallbladder fundus. Although this may represent adenomyomatosis, gallbladder malignancy is not excluded. Further characterization with MRI without and with contrast is recommended. There is gallbladder wall edema. Pancreas: Unremarkable. No pancreatic ductal dilatation or surrounding inflammatory changes. Spleen: Small splenic  calcification. Adrenals/Urinary Tract: The adrenal glands unremarkable. There is no hydronephrosis or nephrolithiasis on either side. Focus of cortical calcification in the inferior pole of the right kidney. Several small left renal cysts and additional bilateral hypodense lesions which are too small to characterize. The kidneys are mildly atrophic. The visualized ureters and urinary bladder appear unremarkable. Stomach/Bowel: There is moderate stool throughout the colon. There is no bowel obstruction or active inflammation. The appendix is normal. No evidence of active GI bleed. Lymphatic: No adenopathy. Reproductive: The uterus and ovaries are grossly unremarkable. Other: None Musculoskeletal: Osteopenia with degenerative changes of the spine. There is extensive erosive changes with endplate irregularity, fragmentation, and sclerotic changes and associated soft tissue thickening involving L4-L5 most consistent with spondylo discitis. Further evaluation with MRI without and with contrast is recommended. IMPRESSION: 1. No evidence of active GI bleed. 2. Cholelithiasis with gallbladder wall edema. If there is clinical concern for acute cholecystitis, further evaluation with ultrasound or HIDA scan recommended. 3. Masslike thickening of the gallbladder fundus may represent advanced adenomyomatosis versus neoplasm. Further evaluation with MRI without and with contrast is recommended. 4. 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 Further evaluation with lumbar spine MRI without and with contrast is recommended. 5. There is a 9 mm right middle lobe nodule. Follow-up as per recommendation of prior CT. 6. Aortic Atherosclerosis (ICD10-I70.0). Electronically Signed   By: Anner Crete M.D.   On: 01/16/2022 20:58     Today   Subjective     Jon today has no headache,no chest abdominal pain,no new weakness tingling or numbness, feels much better wants to go home today.  Some chronic back stiffness and pain.    Objective   Blood pressure 134/77, pulse 86, temperature 98.4 F (36.9 C), temperature source Oral, resp. rate 18, height 5\' 7"  (1.702 m), weight 60.2 kg, SpO2 99 %.   Intake/Output Summary (Last 24 hours) at 01/22/2022 1014 Last data filed at 01/22/2022 0650 Gross per 24 hour  Intake 750 ml  Output 2378 ml  Net -1628 ml    Exam  Awake Alert x2, No new F.N deficits,    [place].AT,PERRAL Supple Neck,   Symmetrical Chest wall movement, Good air movement bilaterally, CTAB RRR,No Gallops,   +ve B.Sounds, Abd Soft, Non tender,  No Cyanosis, Clubbing or edema    Data Review   CBC w Diff:  Lab Results  Component Value Date   WBC 4.2 01/22/2022   HGB 9.0 (L) 01/22/2022   HCT 27.4 (L) 01/22/2022   PLT 181 01/22/2022   LYMPHOPCT 22 01/22/2022   MONOPCT 10 01/22/2022   EOSPCT 5 01/22/2022   BASOPCT 1 01/22/2022    CMP:  Lab Results  Component Value Date   NA 137 01/21/2022   K 3.7 01/21/2022   CL 99 01/21/2022   CO2 28 01/21/2022   BUN 10 01/21/2022   CREATININE 3.95 (H) 01/21/2022   CREATININE 5.76 (H) 12/31/2020   PROT 6.4 (L) 01/21/2022   ALBUMIN 2.1 (L) 01/21/2022   BILITOT 0.6 01/21/2022   ALKPHOS 165 (H) 01/21/2022   AST 64 (H) 01/21/2022   ALT 19 01/21/2022  .   Total Time in preparing paper work, data evaluation and todays exam - 39 minutes  Lala Lund M.D on 01/22/2022 at 10:14 AM  [facility]

## 2022-01-22 NOTE — TOC Progression Note (Signed)
Transition of Care [facility]) - Initial/Assessment Note    Patient Details  Name: Diamond [name] MRN: [id number] Date of Birth: 1951/06/24  Transition of Care [facility]) CM/SW Contact:    Milinda Antis, [place] Phone Number: 01/22/2022, 10:25 AM  Clinical Narrative:                 CSW received consult for outpatient Palliative.  [facility] has been notified and will follow up with the family.          Patient Goals and CMS Choice        Expected Discharge Plan and Services           Expected Discharge Date: 01/22/22                                    Prior Living Arrangements/Services                       Activities of Daily Living      Permission Sought/Granted                  Emotional Assessment              Admission diagnosis:  Acute blood loss anemia [D62] Generalized abdominal pain [R10.84] Anemia, unspecified type [D64.9] Patient Active Problem List   Diagnosis Date Noted   Multiple polyps of sigmoid colon    Heme positive stool    Duodenitis    Candida esophagitis (HCC)    Generalized abdominal pain    Gallbladder mass 01/17/2022   Acute blood loss anemia 01/16/2022   Acute infective endocarditis 01/16/2022   Septic discitis of lumbar region 01/16/2022   Hypotension 12/27/2021   Bleeding from dialysis catheter site 12/26/2021   Atrial flutter ([place]) 12/25/2021   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 seen on TEE on 1/17 12/24/2021   Lung nodule seen on imaging study 12/23/2021   MRSA bacteremia    Vertebral osteomyelitis ([place])    Leukocytosis 12/17/2021   Severe sepsis ([place]) due to MRSA bacteremia, L5-S1 discitis and endocarditis 12/17/2021   Lactic acidosis 12/17/2021   Low back pain 12/17/2021   ESRD on hemodialysis ([place]) 12/17/2021   Mild intermittent asthma 12/17/2021   Vitamin D deficiency 01/02/2021   Hyperparathyroidism ([place]) 12/31/2020   CKD (chronic kidney disease), stage III ([place]) 11/10/2019    Cardiomegaly 11/05/2019   Hyperkalemia 11/05/2019   Hypokalemia 10/18/2019   Pneumonia due to COVID-19 virus 10/17/2019   CAP (community acquired pneumonia) 10/16/2019   Essential hypertension    Asthma    Gout    Hyponatremia    Multifactorial anemia-acute blood loss from bleeding HD catheter site-superimposed on anemia related to ESRD.    PCP:  [name], MD Pharmacy:   [facility], [place] [address] [place] [address] Phone: [phone] Fax: [place] [address] [address] Phone: [phone] Fax: [phone]     Social Determinants of Health (SDOH) Interventions    Readmission Risk Interventions No flowsheet data found.

## 2022-01-22 NOTE — TOC Transition Note (Signed)
Transition of Care [facility]) - CM/SW Discharge Note   Patient Details  Name: Diamond [name] MRN: [id number] Date of Birth: 1951-09-07  Transition of Care [facility]) CM/SW Contact:  Diamond [name], Diamond Ee, RN Phone Number: 01/22/2022, 12:31 PM   Clinical Narrative:     Patient is scheduled for discharge today. CM notified by SW that patient and family are declining SNF and wants to go home with home health. CM spoke with patient, son Diamond [name], daughter Diamond [name] Patient at bedside and daughter, Diamond [name] was on the phone. CM notified by all that MD told them patient is not medically ready to be discharged and that they have filled discharge appeal. CM explained to them the process about appeal and what the expectations are. All parties involved acknowledged understanding. MD notified of appeal. CM started appeal paper work when notified that patient and family has agreed to be discharged today. CM spoke with son, Diamond [name] and he confirmed that patient will be discharged today. Appeal documentation canceled. Son notified to call Diamond [name] and cancel appeal. List of home health agencies from Medicare.gov given to patient and family and they chose [facility]. Referral made with Diamond [name] at [facility] and acceptance voiced.  Family requested hospital bed, bedside commode and wheelchair. Order called in to [place] to deliver wheelchair and bedside commode at bedside and hospital bed will be delivered at home.  No other needs identified. No further TOC needs noted.  Final next level of care: [place] Barriers to Discharge: Barriers Resolved   Patient Goals and CMS Choice Patient states their goals for this hospitalization and ongoing recovery are:: To return home CMS Medicare.gov Compare Post Acute Care list provided to:: Patient Choice offered to / list presented to : Patient, Adult Children  Discharge Placement                Patient to be transferred to facility by: Family (Home)       Discharge Plan and Services                DME Arranged: 3-N-1, Lightweight manual wheelchair with seat cushion, Gel overlay [place] [facility] bed) DME Agency: [employer] Date DME Agency Contacted: 01/22/22 Time DME Agency Contacted: 1215 Representative spoke with at DME Agency: Diamond [name] HH Arranged: PT, RN, Disease Management, OT, Nurse's Aide, Social Work [facility] Agency: [facility] Date [place]: 01/22/22 Time [place]: 62 Representative spoke with at [place]: [place] ([place]) Interventions     Readmission Risk Interventions Readmission Risk Prevention Plan 01/22/2022  Transportation Screening Complete  Medication Review Press photographer) Complete  PCP or Specialist appointment within 3-5 days of discharge Complete  HRI or [place] Complete  SW Recovery Care/Counseling Consult Complete  [place] Not Applicable  Some recent data might be hidden

## 2022-01-22 NOTE — Progress Notes (Cosign Needed)
Patient suffers from Weakness which impairs their ability to perform daily activities like bathing, grooming, and toileting in the home.  A walker will not resolve issue with performing activities of daily living. A wheelchair will allow patient to safely perform daily activities. Patient is not able to propel themselves in the home using a standard weight wheelchair due to general weakness. Patient can self propel in the lightweight wheelchair. Length of need 12 months . Accessories: elevating leg rests (ELRs), wheel locks, extensions and anti-tippers.

## 2022-01-22 NOTE — Progress Notes (Signed)
[facility] ([facility])  Hospital Liaison: RN note         This patient has been referred to our palliative care services in the community.  [facility] will continue to follow for any discharge planning needs and to coordinate continuation of palliative care in the outpatient setting.    If you have questions or need assistance, please call [phone] or contact the hospital Liaison listed on AMION.      Thank you for this referral.         Mary Anne Robertson, RN, CCM  [facility] Hospital Liaison   [phone] 

## 2022-01-22 NOTE — Plan of Care (Signed)

## 2022-01-23 ENCOUNTER — Encounter (HOSPITAL_COMMUNITY): Payer: Self-pay

## 2022-01-23 ENCOUNTER — Emergency Department (HOSPITAL_COMMUNITY)
Admission: EM | Admit: 2022-01-23 | Discharge: 2022-01-23 | Disposition: A | Discharge status: Home / Self Care | Payer: Medicare HMO | Attending: Emergency Medicine | Admitting: Emergency Medicine

## 2022-01-23 DIAGNOSIS — Z760 Encounter for issue of repeat prescription: Secondary | ICD-10-CM | POA: Diagnosis not present

## 2022-01-23 DIAGNOSIS — M549 Dorsalgia, unspecified: Secondary | ICD-10-CM | POA: Diagnosis not present

## 2022-01-23 DIAGNOSIS — Z79899 Other long term (current) drug therapy: Secondary | ICD-10-CM | POA: Insufficient documentation

## 2022-01-23 DIAGNOSIS — Z7901 Long term (current) use of anticoagulants: Secondary | ICD-10-CM | POA: Insufficient documentation

## 2022-01-23 DIAGNOSIS — R6889 Other general symptoms and signs: Secondary | ICD-10-CM | POA: Diagnosis not present

## 2022-01-23 DIAGNOSIS — Z992 Dependence on renal dialysis: Secondary | ICD-10-CM | POA: Insufficient documentation

## 2022-01-23 LAB — CBC WITH DIFFERENTIAL/PLATELET
Abs Immature Granulocytes: 0.03 10*3/uL (ref 0.00–0.07)
Basophils Absolute: 0 10*3/uL (ref 0.0–0.1)
Basophils Relative: 0 %
Eosinophils Absolute: 0.2 10*3/uL (ref 0.0–0.5)
Eosinophils Relative: 4 %
HCT: 30 % — ABNORMAL LOW (ref 36.0–46.0)
Hemoglobin: 9.6 g/dL — ABNORMAL LOW (ref 12.0–15.0)
Immature Granulocytes: 1 %
Lymphocytes Relative: 16 %
Lymphs Abs: 1 10*3/uL (ref 0.7–4.0)
MCH: 30.3 pg (ref 26.0–34.0)
MCHC: 32 g/dL (ref 30.0–36.0)
MCV: 94.6 fL (ref 80.0–100.0)
Monocytes Absolute: 0.5 10*3/uL (ref 0.1–1.0)
Monocytes Relative: 8 %
Neutro Abs: 4.5 10*3/uL (ref 1.7–7.7)
Neutrophils Relative %: 71 %
Platelets: 193 10*3/uL (ref 150–400)
RBC: 3.17 MIL/uL — ABNORMAL LOW (ref 3.87–5.11)
RDW: 16.1 % — ABNORMAL HIGH (ref 11.5–15.5)
WBC: 6.3 10*3/uL (ref 4.0–10.5)
nRBC: 0 % (ref 0.0–0.2)

## 2022-01-23 LAB — BASIC METABOLIC PANEL
Anion gap: 12 (ref 5–15)
BUN: 24 mg/dL — ABNORMAL HIGH (ref 8–23)
CO2: 25 mmol/L (ref 22–32)
Calcium: 10.2 mg/dL (ref 8.9–10.3)
Chloride: 93 mmol/L — ABNORMAL LOW (ref 98–111)
Creatinine, Ser: 5.29 mg/dL — ABNORMAL HIGH (ref 0.44–1.00)
GFR, Estimated: 8 mL/min — ABNORMAL LOW (ref >60)
Glucose, Bld: 98 mg/dL (ref 70–99)
Potassium: 4.2 mmol/L (ref 3.5–5.1)
Sodium: 130 mmol/L — ABNORMAL LOW (ref 135–145)

## 2022-01-23 MED ORDER — OXYCODONE HCL 5 MG PO TABS
5.0000 mg | ORAL_TABLET | Freq: Once | ORAL | Status: AC
Start: 2022-01-23 — End: 2022-01-23
  Administered 2022-01-23: 5 mg via ORAL
  Filled 2022-01-23 (×2): qty 1

## 2022-01-23 MED ORDER — OXYCODONE HCL 5 MG PO TABS: 5.0000 mg | ORAL_TABLET | ORAL | 0 refills | Status: DC | PRN

## 2022-01-23 NOTE — Plan of Care (Signed)
Received a call from family on 01/23/2022 that they do not have any of her home medications left, they told me that they were not aware where patient kept her home medications before going to SNF.  They forgot to tell us yesterday prior to discharge about it.   I called [place] [phone] and left voicemail for refills on essential medications, family told narcotics cannot be called in over the phone.  Also requested her to call the PCP right away for further medication refills.

## 2022-01-23 NOTE — ED Provider Notes (Signed)
[facility] EMERGENCY DEPARTMENT Provider Note   CSN: [id number] Arrival date & time: 01/23/22  1145     History  Chief Complaint  Patient presents with   Back Pain    Diamond [name] is a 71 y.o. female.  71 year old female with prior medical history as detailed below presents for evaluation.  Patient complains that her medications were not filled appropriately at time of discharge yesterday.  Specifically, patient is requesting prescription for oxycodone.  Chart review reveals that patient's prescriptions delayed, discharged yesterday.  Note from Dr. Candiss Norse from yesterday afternoon suggest that patient's medications were called in for refill at [facility].  This did not include her oxycodone.  Patient reports that she missed her dialysis session today because of her back pain that was untreated.  She is due for dialysis on Tuesday.  The history is provided by the patient and medical records.  Illness Location:  Needs prescription for oxycodone, missed dialysis today Severity:  Mild Onset quality:  Gradual Duration:  1 day Timing:  Constant Progression:  Unchanged Chronicity:  New     Home Medications Prior to Admission medications   Medication Sig Start Date End Date Taking? Authorizing Provider  oxyCODONE (ROXICODONE) 5 MG immediate release tablet Take 1 tablet (5 mg total) by mouth every 4 (four) hours as needed for severe pain. 01/23/22  Yes Valarie Merino, MD  acetaminophen (TYLENOL) 500 MG tablet Take 1,000 mg by mouth See admin instructions. 1000 mg twice daily scheduled. May take an additional 1000 mg daily as needed for pain.    Raymondo Band, MD  albuterol (VENTOLIN HFA) 108 (90 Base) MCG/ACT inhaler Inhale 2 puffs into the lungs every 4 (four) hours as needed for wheezing or shortness of breath. 10/08/19   [provider]  allopurinol (ZYLOPRIM) 100 MG tablet Take 200 mg by mouth daily. 12/03/21   [provider]  apixaban  (ELIQUIS) 5 MG TABS tablet Take 1 tablet (5 mg total) by mouth 2 (two) times daily. 12/30/21   Ghimire, Henreitta Leber, MD  colchicine 0.6 MG tablet Take 0.6 mg by mouth daily as needed (gout).    [provider]  cyclobenzaprine (FLEXERIL) 5 MG tablet Take 2.5 mg by mouth daily.    [provider]  fluconazole (DIFLUCAN) 100 MG tablet Take 1 tablet (100 mg total) by mouth daily for 17 days. 01/22/22 02/08/22  Thurnell Lose, MD  gabapentin (NEURONTIN) 100 MG capsule Take 100 mg by mouth at bedtime.    [provider]  hydrALAZINE (APRESOLINE) 50 MG tablet Take 1 tablet (50 mg total) by mouth every 8 (eight) hours. 12/30/21   Ghimire, Henreitta Leber, MD  hydrOXYzine (ATARAX) 25 MG tablet Take 12.5 mg by mouth every 6 (six) hours as needed for itching. For 12 days 01/11/22   [provider]  Lidocaine 4 % PTCH Apply 1 patch topically daily.    Raymondo Band, MD  metoprolol tartrate (LOPRESSOR) 50 MG tablet Take 1 tablet (50 mg total) by mouth 2 (two) times daily. 12/30/21   Ghimire, Henreitta Leber, MD  ondansetron (ZOFRAN) 4 MG tablet Take 4 mg by mouth every 8 (eight) hours as needed. 09/17/21   [provider]  oxyCODONE (OXY IR/ROXICODONE) 5 MG immediate release tablet Take 1 tablet (5 mg total) by mouth every 6 (six) hours as needed for severe pain. 12/30/21   Ghimire, Henreitta Leber, MD  pantoprazole (PROTONIX) 40 MG tablet Take 1 tablet (40 mg total)  by mouth 2 (two) times daily. 01/22/22   Thurnell Lose, MD  polyethylene glycol (MIRALAX / GLYCOLAX) 17 g packet Take 17 g by mouth daily as needed for mild constipation. 12/30/21   Ghimire, Henreitta Leber, MD  senna-docusate (SENOKOT-S) 8.6-50 MG tablet Take 2 tablets by mouth 2 (two) times daily. 12/30/21   Ghimire, Henreitta Leber, MD  sevelamer carbonate (RENVELA) 800 MG tablet Take 2 tablets (1,600 mg total) by mouth 3 (three) times daily with meals. Patient taking differently: Take 800-2,400 mg by mouth See admin instructions. 2400  mg by mouth three times daily and an additional 800 mg by mouth with a snack 12/30/21   Ghimire, Henreitta Leber, MD  Skin Protectants, Misc. (EUCERIN) cream Apply 1 application topically daily.    Jules Husbands A, MD  vancomycin 750 mg in sodium chloride 0.9 % 150 mL Inject 750 mg into the vein Every Tuesday,Thursday,and Saturday with dialysis for 25 days. Stop date 02/13/22 01/22/22 02/16/22  Thurnell Lose, MD      Allergies    Shrimp (diagnostic)    Review of Systems   Review of Systems  All other systems reviewed and are negative.  Physical Exam Updated Vital Signs BP 136/89    Pulse 77    Temp 98.6 F (37 C)    Resp 16    Ht 5\' 7"  (1.702 m)    Wt 59.9 kg    SpO2 99%    BMI 20.67 kg/m  Physical Exam Vitals and nursing note reviewed.  Constitutional:      General: She is not in acute distress.    Appearance: Normal appearance. She is well-developed.  HENT:     Head: Normocephalic and atraumatic.  Eyes:     Conjunctiva/sclera: Conjunctivae normal.     Pupils: Pupils are equal, round, and reactive to light.  Cardiovascular:     Rate and Rhythm: Normal rate and regular rhythm.     Heart sounds: Normal heart sounds.  Pulmonary:     Effort: Pulmonary effort is normal. No respiratory distress.     Breath sounds: Normal breath sounds.  Abdominal:     General: There is no distension.     Palpations: Abdomen is soft.     Tenderness: There is no abdominal tenderness.  Musculoskeletal:        General: No deformity. Normal range of motion.     Cervical back: Normal range of motion and neck supple.  Skin:    General: Skin is warm and dry.  Neurological:     General: No focal deficit present.     Mental Status: She is alert and oriented to person, place, and time.    ED Results / Procedures / Treatments   Labs (all labs ordered are listed, but only abnormal results are displayed) Labs Reviewed  BASIC METABOLIC PANEL - Abnormal; Notable for the following components:      Result  Value   Sodium 130 (*)    Chloride 93 (*)    BUN 24 (*)    Creatinine, Ser 5.29 (*)    GFR, Estimated 8 (*)    All other components within normal limits  CBC WITH DIFFERENTIAL/PLATELET - Abnormal; Notable for the following components:   RBC 3.17 (*)    Hemoglobin 9.6 (*)    HCT 30.0 (*)    RDW 16.1 (*)    All other components within normal limits    EKG None  Radiology No results found.  Procedures Procedures  Medications Ordered in ED Medications  oxyCODONE (Oxy IR/ROXICODONE) immediate release tablet 5 mg (5 mg Oral Given 01/23/22 1229)    ED Course/ Medical Decision Making/ A&P                           Medical Decision Making Amount and/or Complexity of Data Reviewed Labs: ordered.  Risk Prescription drug management.    Medical Screen Complete  This patient presented to the ED with complaint of medication refill.  This complaint involves an extensive number of treatment options. The initial differential diagnosis includes, but is not limited to, medication refill  This presentation is: Acute  Patient was discharged yesterday from this facility.  Per chart review, patient's medications were called into [facility].  Patient reports that her narcotic prescription was not called in.  Patient is requesting a prescription for her narcotics.  She missed dialysis today secondary to pain that was uncontrolled.  Screening labs obtained are without significant abnormality.  She does feel improved after administration of oxycodone.    Prescription for small amount of oxycodone made.  Patient and patient's family understand plan to get her prescriptions filled at [facility].  Additional history obtained:  Additional history obtained from [facility] External records from outside sources obtained and reviewed including prior ED visits and prior Inpatient records.    Lab Tests:  I ordered and personally interpreted labs.  The pertinent results include: CBC,  BMP  Medicines ordered:  I ordered medication including oxycodone for pain Reevaluation of the patient after these medicines showed that the patient: improved    Problem List / ED Course:  Medication refill   Reevaluation:  After the interventions noted above, I reevaluated the patient and found that they have: improved    Disposition:  After consideration of the diagnostic results and the patients response to treatment, I feel that the patent would benefit from close outpatient follow-up.          Final Clinical Impression(s) / ED Diagnoses Final diagnoses:  Medication refill    Rx / DC Orders ED Discharge Orders          Ordered    oxyCODONE (ROXICODONE) 5 MG immediate release tablet  Every 4 hours PRN        01/23/22 1508              Valarie Merino, MD 01/23/22 1553

## 2022-01-23 NOTE — ED Notes (Addendum)
Pt had an extremely large bowel movement and requested a wheel chair in order to go to the bathroom. Pt said that she could not stand up, this technician notified the patient that there was no other way for the patient to get off of the toilet.The patient had previously been able to transfer from her bed to the wheelchair. After informing the patient, they were able to stand and bare their own weight and move back into the wheelchair. Pt cleaned up and returned back to bed, resting comfortably.

## 2022-01-23 NOTE — ED Notes (Signed)
Pt had a large BM in bed.

## 2022-01-23 NOTE — Discharge Instructions (Signed)
Return for any problem.   A small quantity of oxycodone has been sent into your pharmacy for you to fill and use.  Your other medications should be available at your pharmacy for you to pick up.

## 2022-01-23 NOTE — ED Triage Notes (Signed)
Pt BIBA from home. Pt was seen dx recently with MRSA in spine. Pt's meds were changed at that time. However, pt did not receive prescriptions for the meds, has been without for 2 days.  Pt c/o back pain getting progressively worse. Pain is worse with movement.  Pt missed dialysis today, family requesting she receives it today.  130/78 90 100%

## 2022-01-25 NOTE — Patient Outreach (Signed)
[place] [facility]) Care Management  01/25/2022  Diamond [name] 1951/04/04 [id number]   Received hospital referral from Natividad Brood, RN for [name] for complex care and disease management follow up calls and assess for further needs. Assigned patient to Enzo Montgomery, RN Care Coordinator for follow up.  [place] Management Assistant [phone]

## 2022-01-26 DIAGNOSIS — Z992 Dependence on renal dialysis: Secondary | ICD-10-CM | POA: Diagnosis not present

## 2022-01-26 DIAGNOSIS — R6889 Other general symptoms and signs: Secondary | ICD-10-CM | POA: Diagnosis not present

## 2022-01-26 DIAGNOSIS — N2581 Secondary hyperparathyroidism of renal origin: Secondary | ICD-10-CM | POA: Diagnosis not present

## 2022-01-26 DIAGNOSIS — N186 End stage renal disease: Secondary | ICD-10-CM | POA: Diagnosis not present

## 2022-01-27 ENCOUNTER — Other Ambulatory Visit: Payer: Self-pay

## 2022-01-27 DIAGNOSIS — J4530 Mild persistent asthma, uncomplicated: Secondary | ICD-10-CM | POA: Diagnosis not present

## 2022-01-27 DIAGNOSIS — I10 Essential (primary) hypertension: Secondary | ICD-10-CM | POA: Diagnosis not present

## 2022-01-27 DIAGNOSIS — K219 Gastro-esophageal reflux disease without esophagitis: Secondary | ICD-10-CM | POA: Diagnosis not present

## 2022-01-27 NOTE — Patient Outreach (Signed)
[place] [facility]) Care Management  01/27/2022  Diamond [name] [birth year] [id number]     Transition of Care Referral  Referral Date: 01/25/2022 Referral [facility] Liaison Date of Discharge:01/22/2022 Facility: [facility] attempt # 1 to patient. Spoke briefly with patient as she reported she was getting ready to head to PCP follow up appt. She also reports [place] services coming later today. She complains of pain to back areas when moving. She is taking pain meds but does not feel like they are helping. She will discuss with MD at appt. Denies any RN CM needs at present. Advised that RN CM would call back at another time.     Plan: RN CM will make outreach attempt within 4 business days.    Enzo Montgomery, RN,BSN,CCM [place] Management Telephonic Care Management Coordinator Direct Phone: [phone] Toll Free: [phone] Fax: [phone]

## 2022-01-28 DIAGNOSIS — N186 End stage renal disease: Secondary | ICD-10-CM | POA: Diagnosis not present

## 2022-01-28 DIAGNOSIS — N2581 Secondary hyperparathyroidism of renal origin: Secondary | ICD-10-CM | POA: Diagnosis not present

## 2022-01-28 DIAGNOSIS — R6889 Other general symptoms and signs: Secondary | ICD-10-CM | POA: Diagnosis not present

## 2022-01-28 DIAGNOSIS — Z992 Dependence on renal dialysis: Secondary | ICD-10-CM | POA: Diagnosis not present

## 2022-01-29 ENCOUNTER — Other Ambulatory Visit: Payer: Self-pay

## 2022-01-29 ENCOUNTER — Telehealth: Payer: Self-pay | Admitting: Internal Medicine

## 2022-01-29 NOTE — Patient Outreach (Signed)
[place] [facility]) Care Management  01/29/2022  BOWEN GOYAL 1951/04/25 [id number]   Telephone Assessment   Transition of Care Referral   Referral Date: 01/25/2022 Referral [facility] Liaison Date of Discharge:01/22/2022 Facility: [facility]   Unsuccessful outreach attempt #2 to patient.    Plan: RN CM will make outreach attempt within 4 business days.  Enzo Montgomery, RN,BSN,CCM [place] Management Telephonic Care Management Coordinator Direct Phone: [phone] Toll Free: [phone] Fax: [phone]

## 2022-01-29 NOTE — Telephone Encounter (Signed)
T/C to patient to schedule [facility] Palliative visit. She said her schedule was very busy and not sure she can fit anything else in.

## 2022-01-30 DIAGNOSIS — N186 End stage renal disease: Secondary | ICD-10-CM | POA: Diagnosis not present

## 2022-01-30 DIAGNOSIS — N2581 Secondary hyperparathyroidism of renal origin: Secondary | ICD-10-CM | POA: Diagnosis not present

## 2022-01-30 DIAGNOSIS — Z992 Dependence on renal dialysis: Secondary | ICD-10-CM | POA: Diagnosis not present

## 2022-02-01 ENCOUNTER — Other Ambulatory Visit: Payer: Self-pay

## 2022-02-01 DIAGNOSIS — N186 End stage renal disease: Secondary | ICD-10-CM

## 2022-02-01 DIAGNOSIS — Z992 Dependence on renal dialysis: Secondary | ICD-10-CM

## 2022-02-01 NOTE — Patient Outreach (Signed)
[place] [facility]) Care Management  02/01/2022  SANSA ALKEMA 1951-09-11 [id number]   Transition of Care Referral   Referral Date: 01/25/2022 Referral [facility] Liaison Date of Discharge:01/22/2022 Facility: [facility]   Unsuccessful outreach attempt to patient.   Plan: RN CM will make outreach attempt to patient within 3-4 wks if no response from letter mailed to patient.  Enzo Montgomery, RN,BSN,CCM [place] Management Telephonic Care Management Coordinator Direct Phone: [phone] Toll Free: [phone] Fax: [phone]

## 2022-02-02 ENCOUNTER — Encounter: Payer: Self-pay | Admitting: Gastroenterology

## 2022-02-02 DIAGNOSIS — Z992 Dependence on renal dialysis: Secondary | ICD-10-CM | POA: Diagnosis not present

## 2022-02-02 DIAGNOSIS — N2581 Secondary hyperparathyroidism of renal origin: Secondary | ICD-10-CM | POA: Diagnosis not present

## 2022-02-02 DIAGNOSIS — I120 Hypertensive chronic kidney disease with stage 5 chronic kidney disease or end stage renal disease: Secondary | ICD-10-CM | POA: Diagnosis not present

## 2022-02-02 DIAGNOSIS — N186 End stage renal disease: Secondary | ICD-10-CM | POA: Diagnosis not present

## 2022-02-02 DIAGNOSIS — R6889 Other general symptoms and signs: Secondary | ICD-10-CM | POA: Diagnosis not present

## 2022-02-03 ENCOUNTER — Inpatient Hospital Stay: Payer: Medicare HMO | Admitting: Internal Medicine

## 2022-02-04 ENCOUNTER — Encounter (HOSPITAL_COMMUNITY): Payer: Self-pay

## 2022-02-04 ENCOUNTER — Telehealth: Payer: Self-pay

## 2022-02-04 DIAGNOSIS — Z992 Dependence on renal dialysis: Secondary | ICD-10-CM | POA: Diagnosis not present

## 2022-02-04 DIAGNOSIS — N186 End stage renal disease: Secondary | ICD-10-CM | POA: Diagnosis not present

## 2022-02-04 DIAGNOSIS — N2581 Secondary hyperparathyroidism of renal origin: Secondary | ICD-10-CM | POA: Diagnosis not present

## 2022-02-04 DIAGNOSIS — R6889 Other general symptoms and signs: Secondary | ICD-10-CM | POA: Diagnosis not present

## 2022-02-04 NOTE — Telephone Encounter (Signed)
Spoke with patient's daughter [name] and scheduled a telephonic Palliative Consult for 02/05/22 @ 1:30 PM. ? ?Consent obtained; updated Outlook/Netsmart/Team List and Epic.  ? ?

## 2022-02-05 ENCOUNTER — Ambulatory Visit (HOSPITAL_COMMUNITY): Admission: RE | Admit: 2022-02-05 | Payer: Medicare HMO | Source: Home / Self Care

## 2022-02-05 ENCOUNTER — Telehealth: Payer: Self-pay | Admitting: Family Medicine

## 2022-02-05 ENCOUNTER — Other Ambulatory Visit: Payer: Self-pay

## 2022-02-05 ENCOUNTER — Observation Stay (HOSPITAL_COMMUNITY)
Admission: EM | Admit: 2022-02-05 | Discharge: 2022-02-06 | Disposition: A | Discharge status: Home / Self Care | Payer: Medicare HMO | Attending: Internal Medicine | Admitting: Internal Medicine

## 2022-02-05 ENCOUNTER — Other Ambulatory Visit: Payer: Medicaid Other | Admitting: Family Medicine

## 2022-02-05 ENCOUNTER — Encounter (HOSPITAL_COMMUNITY): Payer: Self-pay | Admitting: *Deleted

## 2022-02-05 ENCOUNTER — Emergency Department (HOSPITAL_COMMUNITY): Payer: Medicare HMO

## 2022-02-05 DIAGNOSIS — B9562 Methicillin resistant Staphylococcus aureus infection as the cause of diseases classified elsewhere: Secondary | ICD-10-CM | POA: Diagnosis present

## 2022-02-05 DIAGNOSIS — I330 Acute and subacute infective endocarditis: Secondary | ICD-10-CM | POA: Diagnosis present

## 2022-02-05 DIAGNOSIS — R0602 Shortness of breath: Secondary | ICD-10-CM | POA: Diagnosis not present

## 2022-02-05 DIAGNOSIS — D649 Anemia, unspecified: Secondary | ICD-10-CM | POA: Diagnosis not present

## 2022-02-05 DIAGNOSIS — I4892 Unspecified atrial flutter: Secondary | ICD-10-CM | POA: Diagnosis not present

## 2022-02-05 DIAGNOSIS — B3781 Candidal esophagitis: Secondary | ICD-10-CM | POA: Diagnosis present

## 2022-02-05 DIAGNOSIS — K922 Gastrointestinal hemorrhage, unspecified: Secondary | ICD-10-CM

## 2022-02-05 DIAGNOSIS — I10 Essential (primary) hypertension: Secondary | ICD-10-CM | POA: Diagnosis not present

## 2022-02-05 DIAGNOSIS — Z7901 Long term (current) use of anticoagulants: Secondary | ICD-10-CM | POA: Insufficient documentation

## 2022-02-05 DIAGNOSIS — R911 Solitary pulmonary nodule: Secondary | ICD-10-CM | POA: Diagnosis present

## 2022-02-05 DIAGNOSIS — J45909 Unspecified asthma, uncomplicated: Secondary | ICD-10-CM | POA: Diagnosis not present

## 2022-02-05 DIAGNOSIS — Z79899 Other long term (current) drug therapy: Secondary | ICD-10-CM | POA: Insufficient documentation

## 2022-02-05 DIAGNOSIS — R7881 Bacteremia: Secondary | ICD-10-CM | POA: Diagnosis present

## 2022-02-05 DIAGNOSIS — Z8616 Personal history of COVID-19: Secondary | ICD-10-CM | POA: Diagnosis not present

## 2022-02-05 DIAGNOSIS — E875 Hyperkalemia: Secondary | ICD-10-CM | POA: Diagnosis not present

## 2022-02-05 DIAGNOSIS — Z992 Dependence on renal dialysis: Secondary | ICD-10-CM | POA: Insufficient documentation

## 2022-02-05 DIAGNOSIS — N186 End stage renal disease: Secondary | ICD-10-CM | POA: Diagnosis not present

## 2022-02-05 DIAGNOSIS — I120 Hypertensive chronic kidney disease with stage 5 chronic kidney disease or end stage renal disease: Secondary | ICD-10-CM | POA: Diagnosis not present

## 2022-02-05 LAB — CBC WITH DIFFERENTIAL/PLATELET
Abs Immature Granulocytes: 0.03 10*3/uL (ref 0.00–0.07)
Basophils Absolute: 0 10*3/uL (ref 0.0–0.1)
Basophils Relative: 1 %
Eosinophils Absolute: 0.1 10*3/uL (ref 0.0–0.5)
Eosinophils Relative: 2 %
HCT: 23 % — ABNORMAL LOW (ref 36.0–46.0)
Hemoglobin: 7.5 g/dL — ABNORMAL LOW (ref 12.0–15.0)
Immature Granulocytes: 1 %
Lymphocytes Relative: 26 %
Lymphs Abs: 1 10*3/uL (ref 0.7–4.0)
MCH: 31.1 pg (ref 26.0–34.0)
MCHC: 32.6 g/dL (ref 30.0–36.0)
MCV: 95.4 fL (ref 80.0–100.0)
Monocytes Absolute: 0.5 10*3/uL (ref 0.1–1.0)
Monocytes Relative: 13 %
Neutro Abs: 2.3 10*3/uL (ref 1.7–7.7)
Neutrophils Relative %: 57 %
Platelets: 199 10*3/uL (ref 150–400)
RBC: 2.41 MIL/uL — ABNORMAL LOW (ref 3.87–5.11)
RDW: 16.5 % — ABNORMAL HIGH (ref 11.5–15.5)
WBC: 3.9 10*3/uL — ABNORMAL LOW (ref 4.0–10.5)
nRBC: 0 % (ref 0.0–0.2)

## 2022-02-05 LAB — COMPREHENSIVE METABOLIC PANEL
ALT: 9 U/L (ref 0–44)
AST: 17 U/L (ref 15–41)
Albumin: 2.4 g/dL — ABNORMAL LOW (ref 3.5–5.0)
Alkaline Phosphatase: 63 U/L (ref 38–126)
Anion gap: 12 (ref 5–15)
BUN: 16 mg/dL (ref 8–23)
CO2: 26 mmol/L (ref 22–32)
Calcium: 9.4 mg/dL (ref 8.9–10.3)
Chloride: 96 mmol/L — ABNORMAL LOW (ref 98–111)
Creatinine, Ser: 3.9 mg/dL — ABNORMAL HIGH (ref 0.44–1.00)
GFR, Estimated: 12 mL/min — ABNORMAL LOW (ref >60)
Glucose, Bld: 92 mg/dL (ref 70–99)
Potassium: 3.3 mmol/L — ABNORMAL LOW (ref 3.5–5.1)
Sodium: 134 mmol/L — ABNORMAL LOW (ref 135–145)
Total Bilirubin: 0.5 mg/dL (ref 0.3–1.2)
Total Protein: 6.7 g/dL (ref 6.5–8.1)

## 2022-02-05 LAB — CBC
HCT: 23.3 % — ABNORMAL LOW (ref 36.0–46.0)
Hemoglobin: 7.7 g/dL — ABNORMAL LOW (ref 12.0–15.0)
MCH: 31.8 pg (ref 26.0–34.0)
MCHC: 33 g/dL (ref 30.0–36.0)
MCV: 96.3 fL (ref 80.0–100.0)
Platelets: 187 10*3/uL (ref 150–400)
RBC: 2.42 MIL/uL — ABNORMAL LOW (ref 3.87–5.11)
RDW: 16.4 % — ABNORMAL HIGH (ref 11.5–15.5)
WBC: 4.2 10*3/uL (ref 4.0–10.5)
nRBC: 0 % (ref 0.0–0.2)

## 2022-02-05 LAB — PREPARE RBC (CROSSMATCH)

## 2022-02-05 LAB — POC OCCULT BLOOD, ED: Fecal Occult Bld: POSITIVE — AB

## 2022-02-05 LAB — MAGNESIUM: Magnesium: 1.7 mg/dL (ref 1.7–2.4)

## 2022-02-05 MED ORDER — OXYCODONE HCL 5 MG PO TABS: 5.0000 mg | ORAL_TABLET | ORAL | Status: DC | PRN

## 2022-02-05 MED ORDER — ALLOPURINOL 100 MG PO TABS
100.0000 mg | ORAL_TABLET | Freq: Every day | ORAL | Status: DC
  Administered 2022-02-06: 100 mg via ORAL
  Filled 2022-02-05: qty 1

## 2022-02-05 MED ORDER — ACETAMINOPHEN 325 MG PO TABS: 650.0000 mg | ORAL_TABLET | Freq: Four times a day (QID) | ORAL | Status: DC | PRN

## 2022-02-05 MED ORDER — HYDRALAZINE HCL 50 MG PO TABS
50.0000 mg | ORAL_TABLET | Freq: Two times a day (BID) | ORAL | Status: DC
  Administered 2022-02-05 – 2022-02-06 (×2): 50 mg via ORAL
  Filled 2022-02-05 (×2): qty 1

## 2022-02-05 MED ORDER — ALBUTEROL SULFATE (2.5 MG/3ML) 0.083% IN NEBU: 2.5000 mg | INHALATION_SOLUTION | RESPIRATORY_TRACT | Status: DC | PRN

## 2022-02-05 MED ORDER — METOPROLOL TARTRATE 50 MG PO TABS
50.0000 mg | ORAL_TABLET | Freq: Two times a day (BID) | ORAL | Status: DC
  Administered 2022-02-05 – 2022-02-06 (×2): 50 mg via ORAL
  Filled 2022-02-05 (×2): qty 1

## 2022-02-05 MED ORDER — SODIUM CHLORIDE 0.9% FLUSH
3.0000 mL | Freq: Two times a day (BID) | INTRAVENOUS | Status: DC
  Administered 2022-02-05: 3 mL via INTRAVENOUS

## 2022-02-05 MED ORDER — PANTOPRAZOLE SODIUM 40 MG PO TBEC
40.0000 mg | DELAYED_RELEASE_TABLET | Freq: Two times a day (BID) | ORAL | Status: DC
  Administered 2022-02-05 – 2022-02-06 (×2): 40 mg via ORAL
  Filled 2022-02-05 (×2): qty 1

## 2022-02-05 MED ORDER — GABAPENTIN 100 MG PO CAPS
100.0000 mg | ORAL_CAPSULE | Freq: Every day | ORAL | Status: DC
  Administered 2022-02-05: 100 mg via ORAL
  Filled 2022-02-05: qty 1

## 2022-02-05 MED ORDER — CYCLOBENZAPRINE HCL 5 MG PO TABS
2.5000 mg | ORAL_TABLET | Freq: Every day | ORAL | Status: DC
Start: 2022-02-06 — End: 2022-02-06
  Administered 2022-02-06: 2.5 mg via ORAL
  Filled 2022-02-05: qty 1

## 2022-02-05 MED ORDER — APIXABAN 5 MG PO TABS
5.0000 mg | ORAL_TABLET | Freq: Two times a day (BID) | ORAL | Status: DC
  Administered 2022-02-05 – 2022-02-06 (×2): 5 mg via ORAL
  Filled 2022-02-05 (×2): qty 1

## 2022-02-05 MED ORDER — SODIUM CHLORIDE 0.9% IV SOLUTION: Freq: Once | INTRAVENOUS | Status: DC

## 2022-02-05 MED ORDER — HYDRALAZINE HCL 20 MG/ML IJ SOLN
10.0000 mg | Freq: Three times a day (TID) | INTRAMUSCULAR | Status: DC | PRN
  Administered 2022-02-05: 10 mg via INTRAVENOUS
  Filled 2022-02-05: qty 1

## 2022-02-05 MED ORDER — SODIUM CHLORIDE 0.9% FLUSH: 3.0000 mL | INTRAVENOUS | Status: DC | PRN

## 2022-02-05 MED ORDER — ACETAMINOPHEN 650 MG RE SUPP: 650.0000 mg | Freq: Four times a day (QID) | RECTAL | Status: DC | PRN

## 2022-02-05 MED ORDER — SODIUM CHLORIDE 0.9 % IV SOLN: 250.0000 mL | INTRAVENOUS | Status: DC | PRN

## 2022-02-05 MED ORDER — VANCOMYCIN HCL 750 MG/150ML IV SOLN
750.0000 mg | INTRAVENOUS | Status: DC
  Administered 2022-02-06: 750 mg via INTRAVENOUS
  Filled 2022-02-05 (×2): qty 150

## 2022-02-05 MED ORDER — FLUCONAZOLE 100 MG PO TABS
100.0000 mg | ORAL_TABLET | Freq: Every day | ORAL | Status: DC
  Administered 2022-02-06: 100 mg via ORAL
  Filled 2022-02-05: qty 1

## 2022-02-05 NOTE — Assessment & Plan Note (Addendum)
Continue vancomycin through 02/13/2022  ?Pharmacy consulted for dose on Saturday  ?

## 2022-02-05 NOTE — Assessment & Plan Note (Signed)
Check magnesium and monitor with ESRD  ?Trend tomorrow  ?

## 2022-02-05 NOTE — Assessment & Plan Note (Addendum)
CHaDS-VaSC of 4 ?GI okay with continuing eliquis  ?Continue lopressor ?Telemetry  ?

## 2022-02-05 NOTE — Assessment & Plan Note (Signed)
Received full dialysis session on Thursday ?No signs of overload/emergent dialysis ?Nephrology consulted and aware she is here ?

## 2022-02-05 NOTE — ED Provider Triage Note (Addendum)
Emergency Medicine Provider Triage Evaluation Note ? ?Diamond [name] , a 71 y.o. female  was evaluated in triage.  Pt sent from dialysis for low Hgb of 6.8.  Received transfusions in past without complications.  Family member observed bright red stools and possibly black tarry stools within the last 2-3 days.  No recent fever or chest pains.  Mild SOB chronic.  Pt without other active complaints.   ? ?Review of Systems  ?Positive: As above ?Negative: As above ? ?Physical Exam  ?BP (!) 149/85 (BP Location: Right Arm)   Pulse 86   Temp 99 ?F (37.2 ?C) (Oral)   Resp 18   SpO2 100%  ?Gen:   Awake, no distress   ?Resp:  Normal effort, CTAB ?MSK:   Moves extremities without difficulty  ?Other:  RRR w/o M/RG ? ?Medical Decision Making  ?Medically screening exam initiated at 1:38 PM.  Appropriate orders placed.  DANIELL PARADISE was informed that the remainder of the evaluation will be completed by another provider, this initial triage assessment does not replace that evaluation, and the importance of remaining in the ED until their evaluation is complete. ? ?Labs, imaging, EKG ?  ?Prince Rome, PA-C ?30/16/01 1344 ? ?  ?Prince Rome, PA-C ?09/32/35 1347 ? ?

## 2022-02-05 NOTE — Assessment & Plan Note (Signed)
71 year old presenting with hgb down to 7.5 and +fecal occult and complaints of dark stool with some red streaks ?-observation to progressive ?-GI consulted and okay with diet, continuing eliquis. ? More equilibration ?-recommended 1 unit of PRBC, has been ordered ?-trend CBC q 6  ?-continue protonix 40mg  BID  ? ?

## 2022-02-05 NOTE — Assessment & Plan Note (Addendum)
Blood pressure elevated, will give her home medication of lopressor and hydralazine ?Hydralazine prn  ? ?

## 2022-02-05 NOTE — Assessment & Plan Note (Deleted)
Received full dialysis session on Thursday ?No signs of overload/emergent dialysis ?Nephrology consulted and aware she is here ?

## 2022-02-05 NOTE — H&P (Signed)
History and Physical    Patient: Diamond [name] IOX:[id number] DOB: 06/03/1951 DOA: 02/05/2022 DOS: the patient was seen and examined on 02/05/2022 PCP: [name], MD  Patient coming from: Home - lives with her youngest daughter    Chief Complaint: melena and low hgb   HPI: Diamond [name] is a 71 y.o. female with medical history significant of ESRD on dialysis TTS, atrial flutter on eliquis, asthma, gout, HTN presenting to ED with melena x 2 days and some red streaks and low hemoglobin. They told her at dialysis to come to the hospital due to anemia. She has been feeling good overall. She last took her eliquis this AM.  She denies any fever/chills, no headaches/dizziness or lightheaded, no chest pain or palpitations, shortness of breath or cough, no stomach pain, no N/V/D, no dysuria or leg swelling.   Recently hospitalized in January with infectious endocarditis, MRSA bacteremia and discitis on IV vancomycin until mid march-02/13/22. During this hospitalization had drop in hgb due to bleeding from HD catheter site and received 3 units of PRBC. She was admitted again in 01/2022 for abdominal pain with +fecal occult and acute on chronic anemia. Colonoscopy and EGD done showed no active bleeding, non specific esophageal plaques and candida esophagitis noted. Polyps removed on cscope. Received 1 unit PRBC at this time.    ER Course:  vitals: afebrile, bp: 149/85, HR; 86, RR: 18, oxygen:100% RA Pertinent labs: fecal occult positive, hgb: 7.5, potassium: 3.3, creatinine: 3.90 CXR: no active disease In ED: GI consulted.    Review of Systems: As mentioned in the history of present illness. All other systems reviewed and are negative. Past Medical History:  Diagnosis Date   Asthma    Atrial flutter with rapid ventricular response ([place]) 12/25/2021   Chronic kidney disease    dialysis Tues Thurs Sat   Dysrhythmia    PAF 10/2019 in setting of COVID-19 PNA   Gout    Hypertension    Past  Surgical History:  Procedure Laterality Date   AV FISTULA PLACEMENT Left 12/23/2021   Procedure: LEFT ARM BRACHIOBASILIC VEIN ARTERIOVENOUS (AV) FISTULA CREATION;  Surgeon: [name], MD;  Location: [place] [facility];  Service: Vascular;  Laterality: Left;   BIOPSY  01/19/2022   Procedure: BIOPSY;  Surgeon: Lavena Bullion, DO;  Location: [place] [facility] [facility];  Service: Gastroenterology;;   [place]   COLONOSCOPY Left 01/19/2022   Procedure: COLONOSCOPY;  Surgeon: Lavena Bullion, DO;  Location: [place];  Service: Gastroenterology;  Laterality: Left;   ESOPHAGOGASTRODUODENOSCOPY Left 01/19/2022   Procedure: ESOPHAGOGASTRODUODENOSCOPY (EGD);  Surgeon: Lavena Bullion, DO;  Location: [facility] ENDOSCOPY;  Service: Gastroenterology;  Laterality: Left;   INSERTION OF DIALYSIS CATHETER Right 12/23/2021   Procedure: INSERTION OF DIALYSIS CATHETER;  Surgeon: [name], MD;  Location: [place];  Service: Vascular;  Laterality: Right;   POLYPECTOMY  01/19/2022   Procedure: POLYPECTOMY;  Surgeon: Lavena Bullion, DO;  Location: [place] [facility]OSCOPY;  Service: Gastroenterology;;   TEE WITHOUT CARDIOVERSION N/A 12/22/2021   Procedure: TRANSESOPHAGEAL ECHOCARDIOGRAM (TEE);  Surgeon: Jerline Pain, MD;  Location: [facility] ENDOSCOPY;  Service: Cardiovascular;  Laterality: N/A;   ULTRASOUND GUIDANCE FOR VASCULAR ACCESS  12/23/2021   Procedure: ULTRASOUND GUIDANCE FOR VASCULAR ACCESS;  Surgeon: [name], MD;  Location: [place];  Service: Vascular;;   Social History:  reports that she has never smoked. She has never used smokeless tobacco. She reports that she does not drink alcohol and does not  use drugs.  Allergies  Allergen Reactions   Shrimp (Diagnostic) Other (See Comments)    Listed on MAR    Family History  Problem Relation Age of Onset   Hypertension Mother    Hypertension Father    Colon cancer Brother    Lung cancer Brother     Prior to Admission medications   Medication Sig Start Date  End Date Taking? Authorizing Provider  acetaminophen (TYLENOL) 500 MG tablet Take 1,000 mg by mouth daily as needed for moderate pain.   Yes Raymondo Band, MD  albuterol (VENTOLIN HFA) 108 (90 Base) MCG/ACT inhaler Inhale 2 puffs into the lungs every 4 (four) hours as needed for wheezing or shortness of breath. 10/08/19  Yes [provider]  allopurinol (ZYLOPRIM) 100 MG tablet Take 100 mg by mouth daily. 12/03/21  Yes [provider]  apixaban (ELIQUIS) 5 MG TABS tablet Take 1 tablet (5 mg total) by mouth 2 (two) times daily. 12/30/21  Yes Ghimire, Henreitta Leber, MD  cyclobenzaprine (FLEXERIL) 5 MG tablet Take 2.5 mg by mouth daily.   Yes [provider]  fluconazole (DIFLUCAN) 100 MG tablet Take 1 tablet (100 mg total) by mouth daily for 17 days. 01/22/22 02/08/22 Yes Thurnell Lose, MD  gabapentin (NEURONTIN) 100 MG capsule Take 100 mg by mouth at bedtime.   Yes [provider]  hydrALAZINE (APRESOLINE) 50 MG tablet Take 1 tablet (50 mg total) by mouth every 8 (eight) hours. Patient taking differently: Take 50 mg by mouth 2 (two) times daily. 12/30/21  Yes Ghimire, Henreitta Leber, MD  Lidocaine 4 % PTCH Apply 1 patch topically daily.   Yes Jules Husbands A, MD  metoprolol tartrate (LOPRESSOR) 50 MG tablet Take 1 tablet (50 mg total) by mouth 2 (two) times daily. 12/30/21  Yes Ghimire, Henreitta Leber, MD  oxyCODONE (ROXICODONE) 5 MG immediate release tablet Take 1 tablet (5 mg total) by mouth every 4 (four) hours as needed for severe pain. 01/23/22  Yes Valarie Merino, MD  pantoprazole (PROTONIX) 40 MG tablet Take 1 tablet (40 mg total) by mouth 2 (two) times daily. 01/22/22  Yes Thurnell Lose, MD  vancomycin 750 mg in sodium chloride 0.9 % 150 mL Inject 750 mg into the vein Every Tuesday,Thursday,and Saturday with dialysis for 25 days. Stop date 02/13/22 01/22/22 02/16/22 Yes Thurnell Lose, MD  oxyCODONE (OXY IR/ROXICODONE) 5 MG immediate release tablet Take 1 tablet (5  mg total) by mouth every 6 (six) hours as needed for severe pain. Patient not taking: Reported on 02/05/2022 12/30/21   Jonetta Osgood, MD  polyethylene glycol (MIRALAX / GLYCOLAX) 17 g packet Take 17 g by mouth daily as needed for mild constipation. Patient not taking: Reported on 02/05/2022 12/30/21   Jonetta Osgood, MD  senna-docusate (SENOKOT-S) 8.6-50 MG tablet Take 2 tablets by mouth 2 (two) times daily. Patient not taking: Reported on 02/05/2022 12/30/21   Jonetta Osgood, MD  sevelamer carbonate (RENVELA) 800 MG tablet Take 2 tablets (1,600 mg total) by mouth 3 (three) times daily with meals. Patient not taking: Reported on 02/05/2022 12/30/21   Jonetta Osgood, MD    Physical Exam: Vitals:   02/05/22 1315 02/05/22 1506 02/05/22 1508 02/05/22 1648  BP: (!) 149/85  (!) 168/106 (!) 178/100  Pulse: 86  87 87  Resp: 18  19 19   Temp: 99 F (37.2 C)  98.6 F (37 C)   TempSrc: Oral  Oral   SpO2: 100%  100% 100%  Weight:  59 kg    Height:  5\' 7"  (1.702 m)     General:  Appears calm and comfortable and is in NAD. Eating dinner Eyes:  PERRL, EOMI, normal lids, iris ENT:  grossly normal hearing, lips & tongue, mmm; appropriate dentition Neck:  no LAD, masses or thyromegaly; no carotid bruits Cardiovascular:  RRR, no m/r/g. No LE edema.  Respiratory:   CTA bilaterally with no wheezes/rales/rhonchi.  Normal respiratory effort. Abdomen:  soft, NT, ND, NABS Back:   normal alignment, no CVAT Skin:  no rash or induration seen on limited exam. HD catheter in right anterior chest  GU: deferred, she just had one done by GI  Musculoskeletal:  grossly normal tone BUE/BLE, good ROM, no bony abnormality Lower extremity:  No LE edema.  Limited foot exam with no ulcerations.  2+ distal pulses. Psychiatric:  grossly normal mood and affect, speech fluent and appropriate, AOx3 Neurologic:  CN 2-12 grossly intact, moves all extremities in coordinated fashion, sensation intact   Radiological Exams  on Admission: Independently reviewed - see discussion in A/P where applicable  DG Chest 2 View  Result Date: 02/05/2022 CLINICAL DATA:  Shortness of breath EXAM: CHEST - 2 VIEW COMPARISON:  Chest x-ray dated January 16, 2022 FINDINGS: Cardiac and mediastinal contours are unchanged. Right chest wall central venous catheter tip with tip positioned near the superior cavoatrial junction. Lungs are clear. No pleural effusion or pneumothorax. IMPRESSION: No active cardiopulmonary disease. Electronically Signed   By: Yetta Glassman M.D.   On: 02/05/2022 14:29    EKG: Independently reviewed.  NSR with rate 85, RBBB; nonspecific ST changes with no evidence of acute ischemia. Similar to previous ekg    Labs on Admission: I have personally reviewed the available labs and imaging studies at the time of the admission.  Pertinent labs:   fecal occult positive,  hgb: 7.5, (9.6-13 days ago)  potassium: 3.3,  creatinine: 3.90   Assessment and Plan: * Acute on chronic anemia with positive fecal occult  71 year old presenting with hgb down to 7.5 and +fecal occult and complaints of dark stool with some red streaks -observation to progressive -GI consulted and okay with diet, continuing eliquis. ? More equilibration -recommended 1 unit of PRBC, has been ordered -trend CBC q 6  -continue protonix 40mg  BID    Hyperkalemia Check magnesium and monitor with ESRD  Trend tomorrow   ESRD on hemodialysis ([place]) Received full dialysis session on Thursday No signs of overload/emergent dialysis Nephrology consulted and aware she is here  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  Continue vancomycin through 02/13/2022  Pharmacy consulted for dose on Saturday   Candida esophagitis ([place]) Diflucan through 02/08/2022  Atrial flutter ([place]) CHaDS-VaSC of 4 GI okay with continuing eliquis  Continue lopressor Telemetry   Essential hypertension Blood pressure elevated, will give her  home medication of lopressor and hydralazine Hydralazine prn    Asthma No signs of exacerbation Continue albuterol prn   Lung nodule seen on imaging study Due for repeat CT scan in April 2023  Imaging in 12/2021: tubular soft tissue density within the lateral middle lobe measuring 7 x 7 x 14 mm. This is indeterminate and could represent a vascular abnormality or a pulmonary nodule.Recommend follow-up CT in 3 months to assess stability    Advance Care Planning:   Code Status: Full Code   Consults: GI: Dr. Carlean Purl and nephrology: Dr. Hollie Salk   DVT Prophylaxis: eliquis  Family Communication: son and daughter at bedside: marvin and charlene   Severity of Illness: The appropriate patient status for this patient is OBSERVATION. Observation status is judged to be reasonable and necessary in order to provide the required intensity of service to ensure the patient's safety. The patient's presenting symptoms, physical exam findings, and initial radiographic and laboratory data in the context of their medical condition is felt to place them at decreased risk for further clinical deterioration. Furthermore, it is anticipated that the patient will be medically stable for discharge from the hospital within 2 midnights of admission.   Author: Orma Flaming, MD 02/05/2022 7:15 PM  For on call review [email or web address].

## 2022-02-05 NOTE — Assessment & Plan Note (Addendum)
Due for repeat CT scan in April 2023  ?Imaging in 12/2021: tubular soft tissue density within the lateral middle lobe measuring 7 x 7 x 14 mm. This is indeterminate ?and could represent a vascular abnormality or a pulmonary nodule.Recommend follow-up CT in 3 months to assess stability ?

## 2022-02-05 NOTE — ED Notes (Signed)
RN attempted IV placement x2, unable to obtain access. IV team consult ordered, IP RN notified.  ?

## 2022-02-05 NOTE — Assessment & Plan Note (Signed)
No signs of exacerbation ?Continue albuterol prn  ?

## 2022-02-05 NOTE — ED Provider Notes (Signed)
[facility] EMERGENCY DEPARTMENT Provider Note   CSN: [id number] Arrival date & time: 02/05/22  1244     History  Chief Complaint  Patient presents with   Abnormal Lab    Diamond [name] is a 71 y.o. female.  Pt is a 70y/o female with mmp including ESRD (Tues, Thurs, Sat), asthma, hypertension, anemia of chronic disease, atrial flutter (on eliquis) and recent diagnosis of suspected infective endocarditis with right atrial mass as well as MRSA bacteremia with lumbar discitis on abx through dialysis till 02/13/22 and suspected GI bleed recently placed on Eliquis, seen by GI underwent EGD and colonoscopy on 01/19/2022 with no active bleeding, nonspecific esophageal plaques and Candida esophagitis noted, some polyps removed during colonoscopy, no active bleeding who was discharged home a few weeks ago.  She has been continuing to get her dialysis 3 times a week and reports overall she feels pretty good.  She did notice a very dark stool yesterday and 1 this morning.  They checked her blood today and her hemoglobin is starting to drop again now at 7.5 from 9.5 when she left the hospital.  She denies any abdominal pain at this time, cough, congestion or shortness of breath.  She is not having any chest pain.  The history is provided by the patient, medical records and a relative.  Abnormal Lab     Home Medications Prior to Admission medications   Medication Sig Start Date End Date Taking? Authorizing Provider  acetaminophen (TYLENOL) 500 MG tablet Take 1,000 mg by mouth daily as needed for moderate pain.   Yes Karna Dupes, MD  albuterol (VENTOLIN HFA) 108 (90 Base) MCG/ACT inhaler Inhale 2 puffs into the lungs every 4 (four) hours as needed for wheezing or shortness of breath. 10/08/19  Yes [provider]  allopurinol (ZYLOPRIM) 100 MG tablet Take 100 mg by mouth daily. 12/03/21  Yes [provider]  apixaban (ELIQUIS) 5 MG TABS tablet Take 1 tablet (5 mg  total) by mouth 2 (two) times daily. 12/30/21  Yes Ghimire, Werner Lean, MD  cyclobenzaprine (FLEXERIL) 5 MG tablet Take 2.5 mg by mouth daily.   Yes [provider]  fluconazole (DIFLUCAN) 100 MG tablet Take 1 tablet (100 mg total) by mouth daily for 17 days. 01/22/22 02/08/22 Yes Leroy Sea, MD  gabapentin (NEURONTIN) 100 MG capsule Take 100 mg by mouth at bedtime.   Yes [provider]  hydrALAZINE (APRESOLINE) 50 MG tablet Take 1 tablet (50 mg total) by mouth every 8 (eight) hours. Patient taking differently: Take 50 mg by mouth 2 (two) times daily. 12/30/21  Yes Ghimire, Werner Lean, MD  Lidocaine 4 % PTCH Apply 1 patch topically daily.   Yes Blenda Mounts A, MD  metoprolol tartrate (LOPRESSOR) 50 MG tablet Take 1 tablet (50 mg total) by mouth 2 (two) times daily. 12/30/21  Yes Ghimire, Werner Lean, MD  oxyCODONE (ROXICODONE) 5 MG immediate release tablet Take 1 tablet (5 mg total) by mouth every 4 (four) hours as needed for severe pain. 01/23/22  Yes Wynetta Fines, MD  pantoprazole (PROTONIX) 40 MG tablet Take 1 tablet (40 mg total) by mouth 2 (two) times daily. 01/22/22  Yes Leroy Sea, MD  vancomycin 750 mg in sodium chloride 0.9 % 150 mL Inject 750 mg into the vein Every Tuesday,Thursday,and Saturday with dialysis for 25 days. Stop date 02/13/22 01/22/22 02/16/22 Yes Leroy Sea, MD  oxyCODONE (OXY IR/ROXICODONE) 5 MG immediate release tablet  Take 1 tablet (5 mg total) by mouth every 6 (six) hours as needed for severe pain. Patient not taking: Reported on 02/05/2022 12/30/21   Maretta Bees, MD  polyethylene glycol (MIRALAX / GLYCOLAX) 17 g packet Take 17 g by mouth daily as needed for mild constipation. Patient not taking: Reported on 02/05/2022 12/30/21   Maretta Bees, MD  senna-docusate (SENOKOT-S) 8.6-50 MG tablet Take 2 tablets by mouth 2 (two) times daily. Patient not taking: Reported on 02/05/2022 12/30/21   Maretta Bees, MD  sevelamer carbonate  (RENVELA) 800 MG tablet Take 2 tablets (1,600 mg total) by mouth 3 (three) times daily with meals. Patient not taking: Reported on 02/05/2022 12/30/21   Maretta Bees, MD      Allergies    Shrimp (diagnostic)    Review of Systems   Review of Systems  Physical Exam Updated Vital Signs BP (!) 178/100 (BP Location: Right Arm)   Pulse 87   Temp 98.6 F (37 C) (Oral)   Resp 19   Ht 5\' 7"  (1.702 m)   Wt 59 kg   SpO2 100%   BMI 20.37 kg/m  Physical Exam Vitals and nursing note reviewed.  Constitutional:      General: She is not in acute distress.    Appearance: She is well-developed. She is ill-appearing.  HENT:     Head: Normocephalic and atraumatic.  Eyes:     Pupils: Pupils are equal, round, and reactive to light.  Cardiovascular:     Rate and Rhythm: Normal rate and regular rhythm.     Heart sounds: Normal heart sounds. No murmur heard.   No friction rub.  Pulmonary:     Effort: Pulmonary effort is normal.     Breath sounds: Normal breath sounds. No wheezing or rales.  Abdominal:     General: Bowel sounds are normal. There is no distension.     Palpations: Abdomen is soft.     Tenderness: There is no abdominal tenderness. There is no guarding or rebound.  Genitourinary:    Comments: Stool is dark brown and heme positive Musculoskeletal:        General: No tenderness. Normal range of motion.     Comments: No edema  Skin:    General: Skin is warm and dry.     Findings: No rash.  Neurological:     Mental Status: She is alert and oriented to person, place, and time. Mental status is at baseline.     Cranial Nerves: No cranial nerve deficit.  Psychiatric:        Mood and Affect: Mood normal.        Behavior: Behavior normal.    ED Results / Procedures / Treatments   Labs (all labs ordered are listed, but only abnormal results are displayed) Labs Reviewed  CBC WITH DIFFERENTIAL/PLATELET - Abnormal; Notable for the following components:      Result Value   WBC  3.9 (*)    RBC 2.41 (*)    Hemoglobin 7.5 (*)    HCT 23.0 (*)    RDW 16.5 (*)    All other components within normal limits  COMPREHENSIVE METABOLIC PANEL - Abnormal; Notable for the following components:   Sodium 134 (*)    Potassium 3.3 (*)    Chloride 96 (*)    Creatinine, Ser 3.90 (*)    Albumin 2.4 (*)    GFR, Estimated 12 (*)    All other components within normal limits  POC  OCCULT BLOOD, ED - Abnormal; Notable for the following components:   Fecal Occult Bld POSITIVE (*)    All other components within normal limits  URINALYSIS, ROUTINE W REFLEX MICROSCOPIC  TYPE AND SCREEN    EKG EKG Interpretation  Date/Time:  Friday February 05 2022 15:26:24 EST Ventricular Rate:  85 PR Interval:  146 QRS Duration: 134 QT Interval:  433 QTC Calculation: 515 R Axis:   25 Text Interpretation: Sinus rhythm Supraventricular bigeminy Right bundle branch block Minimal ST elevation, inferior leads No significant change since last tracing Confirmed by Gwyneth Sprout ([id number]) on 02/05/2022 4:20:33 PM  Radiology DG Chest 2 View  Result Date: 02/05/2022 CLINICAL DATA:  Shortness of breath EXAM: CHEST - 2 VIEW COMPARISON:  Chest x-ray dated January 16, 2022 FINDINGS: Cardiac and mediastinal contours are unchanged. Right chest wall central venous catheter tip with tip positioned near the superior cavoatrial junction. Lungs are clear. No pleural effusion or pneumothorax. IMPRESSION: No active cardiopulmonary disease. Electronically Signed   By: Allegra Lai M.D.   On: 02/05/2022 14:29    Procedures Procedures    Medications Ordered in ED Medications - No data to display  ED Course/ Medical Decision Making/ A&P                           Medical Decision Making Amount and/or Complexity of Data Reviewed External Data Reviewed: labs and notes. Labs: ordered. Decision-making details documented in ED Course. Radiology: ordered and independent interpretation performed. Decision-making details  documented in ED Course. ECG/medicine tests: ordered and independent interpretation performed. Decision-making details documented in ED Course.  Risk Decision regarding hospitalization.   Patient is a 71 year old female with multiple medical problems presenting today due to a decreasing hemoglobin level.  Patient just recently hospitalized a few weeks ago which is all detailed in the HPI.  Today she has no specific complaints and is in no distress.  Vital signs are reassuring.  She is heme positive again on exam and is currently on the Eliquis.  Hemoglobin is now down to 7.5 from 9.5 at discharge after receiving 1 unit.  She is still receiving antibiotics at dialysis.  She denies having any fevers or localized pain.  There was no active bleeding found when she had colonoscopy and endoscopy was unclear where the anemia was coming from.  However patient's baseline hemoglobin seems to be 10.  Because she is Hemoccult positive today will discuss with gastroenterology for their recommendations.  Patient does not appear to have any exacerbation of her known infectious illness that is currently being treated.  I independently interpreted patient's labs and CBC with worsening anemia, persistent leukopenia and normal platelet count, CMP with chronic findings consistent with end-stage renal disease.  I independently visualized and interpreted chest x-ray which was negative for acute findings. I independently interpreted patient's EKG without acute findings.  I spoke with Dr. Elnoria Howard with gastroenterology and they will come to see the patient but given her recent history now with heme positive stools that are dark again and hemoglobin that is trending down will admit for ongoing monitoring.  Findings discussed with the patient and her family member.        Final Clinical Impression(s) / ED Diagnoses Final diagnoses:  Gastrointestinal hemorrhage, unspecified gastrointestinal hemorrhage type  Anemia, unspecified  type    Rx / DC Orders ED Discharge Orders     None         Gwyneth Sprout, MD  02/05/22 1702 ° °

## 2022-02-05 NOTE — Telephone Encounter (Signed)
Phone call to pt's daughter and went to voice mail.  Left message that I was trying to reach her to discuss her mother and provided call back number.  Damaris Hippo FNP-C ?

## 2022-02-05 NOTE — Consult Note (Addendum)
Referring Provider: Emergency Services PCP: [name], MD  Gastroenterologist: Althia Forts but we saw her in hospitalized mid February.  Reason for consultation: Recurrent anemia                   ASSESSMENT / PLAN   #  71 yo female with recurrent acute on chronic anemia, FOBT+.on Eliquis.  Recent pattern of anemia has taken some sorting out. Her  baseline hgb is 9-10 but in late January declined to 7 due to bleeding from HD cath as well as she had severe sepsis / MRSA bacteremia / discitis and endocarditis.  Hgb returned to baseline (9.6) after transfusion but in mid February she was readmitted with a hgb of 6.8 and + FOBT .  She had an unrevealing EGD and colonoscopy that admission.  Transfused a unit of blood with jump in hgb to from 6.8 to 9. Now with reports of dark stool at home as well as "reddish" stool and decline in hgb to 7.5. Today on my DRE she had light brown ( nearly tan) liquid loose stool in vault            --The 7.5 may be a reflection of some equilibration since hgb increased over 2 grams with just one unit of blood last month. That said, she is heme + and daughter does report black stool and 'reddish' stool.  --Observe overnight. Would give a unit of blood.  --No need to hold Eliquis right now --She can have a solid food  # Gallbladder mass. Recent CT scan >masslike thickening of the gallbladder fundus may represent advanced adenomyomatosis versus neoplasm. General Surgery evaluated last admission. She was high risk for surgery in setting of acute infective endocarditis and lumbar discitis with MRSA bacteremia. Plan was for outpatient follow up . # ESRD on HD TTS  # AFlutter, on Eliquis --No need to hold Eliquis.   # Additional medical history listed below.       [facility] Attending  It's not clear that she is having sig hemorrhage though its possible she did. Plans as above. As long as no bleeding seen here would send home tomorrow.  Gatha Mayer, MD,  [facility] 02/05/2022 6:37 PM     HISTORY OF PRESENT ILLNESS                                                                                                                         Chief Complaint: anemia  Diamond [name] is a 71 y.o. female with a past medical history significant for ESRD on HD TTS, , adenomatous colon polyps., gout, hypertension, paroxysmal atrial fibrillation on Eliquis, asthma, cholelithiasis, COVID-19 pneumonia  See PMH for any additional medical problems.   Diamond [name] was hospitalized mid last month. We saw her for heme + acute on chronic anemia on Eliquis. Hgb was 8.1, down from 10.6 two weeks prior. She was having abdominal pain. CT scan >> showed cholelithiasis with evidence suggestive of  acute cholecystitis with a mass in the fundus of the gallbladder concerning for gallbladder carcinoma.  CBD dilatation without intrahepatic biliary ductal dilatation.  Mild diffuse pancreatic ductal dilatation was noted without an obvious pancreatic mass  Patient presented to ED today for evaluation of anemia. Noted to have a decline in hgb while at dialysis. Patient hasn't looked at stool lately but daughter says it has been black and also she had some "reddish mud" looking stool. Patient doesn't have any abdominal pain. No nausea / vomiting. She actually feels well and would like to go home. .     ENDOSCOPIC EVALUATIONS    Feb 2023 colonoscopy  -- 2 sessile polyps ranging 3 to 5 mm were removed.  Exam otherwise normal  EGD 2023  -- Multiple esophageal plaques, erythematous duodenopathy  FINAL MICROSCOPIC DIAGNOSIS:   A. ESOPHAGUS, BIOPSY:  -  Squamous mucosa with no specific pathologic diagnosis.  -  Negative for acute esophagitis and intrasquamous eosinophils.  -  No fungi or viral cytopathic change identified (on HE).  -  Negative for dysplasia and malignancy.  -  No glandular mucosa present.   B. DUODENUM, BIOPSY:  -  Duodenal mucosa with no specific  pathologic diagnosis.  -  Long and slender villi, negative for pathologic intraepithelial  lymphocytosis.   C. COLON, SIGMOID, POLYPECTOMY:  -  Tubular adenoma, negative for high-grade dysplasia.  -  Negative for malignancy.   IMAGING:  01/16/22 CTA abd / pelvis 01/16/22  1. No evidence of active GI bleed. 2. Cholelithiasis with gallbladder wall edema. If there is clinical concern for acute cholecystitis, further evaluation with ultrasound or HIDA scan recommended. 3. Masslike thickening of the gallbladder fundus may represent advanced adenomyomatosis versus neoplasm. Further evaluation with MRI without and with contrast is recommended. 4. 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 Further evaluation with lumbar spine MRI without and with contrast is recommended. 5. There is a 9 mm right middle lobe nodule. Follow-up as per recommendation of prior CT. 6. Aortic Atherosclerosis (ICD10-I70.0).  01/17/22  MRCP IMPRESSION: 1. Cholelithiasis with evidence suggestive of acute cholecystitis. 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 Prominent borderline enlarged portacaval and hepatoduodenal ligament lymph nodes are noted. Attention to these at time of cholecystectomy is recommended, as the possibility of metastatic disease is not excluded. 2. Hepatic and splenic hemosiderosis. 3. Dilatation of the common bile duct without intrahepatic biliary ductal dilatation. This could be benign and age-related. No obstructing choledocholithiasis identified on today's examination. Additionally, there is no definite obstructing lesion in the pancreatic head or region of the ampulla. Notably, however, there is also some mild diffuse pancreatic ductal dilatation. If there is clinical concern for subtle neoplasm in the region of the pancreatic head or ampulla, repeat abdominal MRI with and without  IV gadolinium with MRCP could be considered. Please note, however, that per report from the technologist, the patient refused to follow commands and refused to finish today's examination.    DG Chest 2 View  Result Date: 02/05/2022 CLINICAL DATA:  Shortness of breath EXAM: CHEST - 2 VIEW COMPARISON:  Chest x-ray dated January 16, 2022 FINDINGS: Cardiac and mediastinal contours are unchanged. Right chest wall central venous catheter tip with tip positioned near the superior cavoatrial junction. Lungs are clear. No pleural effusion or pneumothorax. IMPRESSION: No active cardiopulmonary disease. Electronically Signed   By: Yetta Glassman M.D.   On: 02/05/2022 14:29  Past Medical History:  Diagnosis Date   Asthma    Atrial flutter with rapid ventricular response ([place]) 12/25/2021   Chronic kidney disease    dialysis Tues Thurs Sat   Dysrhythmia    PAF 10/2019 in setting of COVID-19 PNA   Gout    Hypertension     Past Surgical History:  Procedure Laterality Date   AV FISTULA PLACEMENT Left 12/23/2021   Procedure: LEFT ARM BRACHIOBASILIC VEIN ARTERIOVENOUS (AV) FISTULA CREATION;  Surgeon: [name], MD;  Location: [facility];  Service: Vascular;  Laterality: Left;   BIOPSY  01/19/2022   Procedure: BIOPSY;  Surgeon: Lavena Bullion, DO;  Location: [facility];  Service: Gastroenterology;;   [place]   COLONOSCOPY Left 01/19/2022   Procedure: COLONOSCOPY;  Surgeon: Lavena Bullion, DO;  Location: [facility];  Service: Gastroenterology;  Laterality: Left;   ESOPHAGOGASTRODUODENOSCOPY Left 01/19/2022   Procedure: ESOPHAGOGASTRODUODENOSCOPY (EGD);  Surgeon: Lavena Bullion, DO;  Location[facility];  Service: Gastroenterology;  Laterality: Left;   INSERTION OF DIALYSIS CATHETER Right 12/23/2021   Procedure: INSERTION OF DIALYSIS CATHETER;  Surgeon: [name], MD;  Location: [place];  Service: Vascular;  Laterality: Right;   POLYPECTOMY  01/19/2022    Procedure: POLYPECTOMY;  Surgeon: Lavena Bullion, DO;  Location: [place] [facility];  Service: Gastroenterology;;   TEE WITHOUT CARDIOVERSION N/A 12/22/2021   Procedure: TRANSESOPHAGEAL ECHOCARDIOGRAM (TEE);  Surgeon: Jerline Pain, MD;  Location: [facility] ENDOSCOPY;  Service: Cardiovascular;  Laterality: N/A;   ULTRASOUND GUIDANCE FOR VASCULAR ACCESS  12/23/2021   Procedure: ULTRASOUND GUIDANCE FOR VASCULAR ACCESS;  Surgeon: [name], MD;  Location: [place];  Service: Vascular;;    Prior to Admission medications   Medication Sig Start Date End Date Taking? Authorizing Provider  acetaminophen (TYLENOL) 500 MG tablet Take 1,000 mg by mouth See admin instructions. 1000 mg twice daily scheduled. May take an additional 1000 mg daily as needed for pain.    Raymondo Band, MD  albuterol (VENTOLIN HFA) 108 (90 Base) MCG/ACT inhaler Inhale 2 puffs into the lungs every 4 (four) hours as needed for wheezing or shortness of breath. 10/08/19   [provider]  allopurinol (ZYLOPRIM) 100 MG tablet Take 200 mg by mouth daily. 12/03/21   [provider]  apixaban (ELIQUIS) 5 MG TABS tablet Take 1 tablet (5 mg total) by mouth 2 (two) times daily. 12/30/21   Ghimire, Henreitta Leber, MD  colchicine 0.6 MG tablet Take 0.6 mg by mouth daily as needed (gout).    [provider]  cyclobenzaprine (FLEXERIL) 5 MG tablet Take 2.5 mg by mouth daily.    [provider]  fluconazole (DIFLUCAN) 100 MG tablet Take 1 tablet (100 mg total) by mouth daily for 17 days. 01/22/22 02/08/22  Thurnell Lose, MD  gabapentin (NEURONTIN) 100 MG capsule Take 100 mg by mouth at bedtime.    [provider]  hydrALAZINE (APRESOLINE) 50 MG tablet Take 1 tablet (50 mg total) by mouth every 8 (eight) hours. 12/30/21   Ghimire, Henreitta Leber, MD  hydrOXYzine (ATARAX) 25 MG tablet Take 12.5 mg by mouth every 6 (six) hours as needed for itching. For 12 days 01/11/22   [provider]  Lidocaine 4 % PTCH Apply  1 patch topically daily.    Raymondo Band, MD  metoprolol tartrate (LOPRESSOR) 50 MG tablet Take 1 tablet (50 mg total) by mouth 2 (two) times daily. 12/30/21   Ghimire, Henreitta Leber, MD  ondansetron (ZOFRAN) 4 MG tablet Take 4 mg by mouth every 8 (eight) hours as needed. 09/17/21   [provider]  oxyCODONE (OXY IR/ROXICODONE) 5 MG immediate release tablet Take 1 tablet (5 mg total) by mouth every 6 (six) hours as needed for severe pain. 12/30/21   Ghimire, Henreitta Leber, MD  oxyCODONE (ROXICODONE) 5 MG immediate release tablet Take 1 tablet (5 mg total) by mouth every 4 (four) hours as needed for severe pain. 01/23/22   Valarie Merino, MD  pantoprazole (PROTONIX) 40 MG tablet Take 1 tablet (40 mg total) by mouth 2 (two) times daily. 01/22/22   Thurnell Lose, MD  polyethylene glycol (MIRALAX / GLYCOLAX) 17 g packet Take 17 g by mouth daily as needed for mild constipation. 12/30/21   Ghimire, Henreitta Leber, MD  senna-docusate (SENOKOT-S) 8.6-50 MG tablet Take 2 tablets by mouth 2 (two) times daily. 12/30/21   Ghimire, Henreitta Leber, MD  sevelamer carbonate (RENVELA) 800 MG tablet Take 2 tablets (1,600 mg total) by mouth 3 (three) times daily with meals. Patient taking differently: Take 800-2,400 mg by mouth See admin instructions. 2400 mg by mouth three times daily and an additional 800 mg by mouth with a snack 12/30/21   Ghimire, Henreitta Leber, MD  Skin Protectants, Misc. (EUCERIN) cream Apply 1 application topically daily.    Jules Husbands A, MD  vancomycin 750 mg in sodium chloride 0.9 % 150 mL Inject 750 mg into the vein Every Tuesday,Thursday,and Saturday with dialysis for 25 days. Stop date 02/13/22 01/22/22 02/16/22  Thurnell Lose, MD    No current facility-administered medications for this encounter.   Current Outpatient Medications  Medication Sig Dispense Refill   acetaminophen (TYLENOL) 500 MG tablet Take 1,000 mg by mouth See admin instructions. 1000 mg twice daily scheduled. May take an  additional 1000 mg daily as needed for pain.     albuterol (VENTOLIN HFA) 108 (90 Base) MCG/ACT inhaler Inhale 2 puffs into the lungs every 4 (four) hours as needed for wheezing or shortness of breath.     allopurinol (ZYLOPRIM) 100 MG tablet Take 200 mg by mouth daily.     apixaban (ELIQUIS) 5 MG TABS tablet Take 1 tablet (5 mg total) by mouth 2 (two) times daily. 60 tablet    colchicine 0.6 MG tablet Take 0.6 mg by mouth daily as needed (gout).     cyclobenzaprine (FLEXERIL) 5 MG tablet Take 2.5 mg by mouth daily.     fluconazole (DIFLUCAN) 100 MG tablet Take 1 tablet (100 mg total) by mouth daily for 17 days. 17 tablet 0   gabapentin (NEURONTIN) 100 MG capsule Take 100 mg by mouth at bedtime.     hydrALAZINE (APRESOLINE) 50 MG tablet Take 1 tablet (50 mg total) by mouth every 8 (eight) hours.     hydrOXYzine (ATARAX) 25 MG tablet Take 12.5 mg by mouth every 6 (six) hours as needed for itching. For 12 days     Lidocaine 4 % PTCH Apply 1 patch topically daily.     metoprolol tartrate (LOPRESSOR) 50 MG tablet Take 1 tablet (50 mg total) by mouth 2 (two) times daily.     ondansetron (ZOFRAN) 4 MG tablet Take 4 mg by mouth every 8 (eight) hours as needed.     oxyCODONE (OXY IR/ROXICODONE) 5 MG immediate release tablet Take 1 tablet (5 mg total) by mouth every 6 (six) hours as needed for severe pain. 10 tablet 0   oxyCODONE (ROXICODONE) 5 MG immediate  release tablet Take 1 tablet (5 mg total) by mouth every 4 (four) hours as needed for severe pain. 15 tablet 0   pantoprazole (PROTONIX) 40 MG tablet Take 1 tablet (40 mg total) by mouth 2 (two) times daily. 60 tablet 0   polyethylene glycol (MIRALAX / GLYCOLAX) 17 g packet Take 17 g by mouth daily as needed for mild constipation. 14 each 0   senna-docusate (SENOKOT-S) 8.6-50 MG tablet Take 2 tablets by mouth 2 (two) times daily.     sevelamer carbonate (RENVELA) 800 MG tablet Take 2 tablets (1,600 mg total) by mouth 3 (three) times daily with meals.  (Patient taking differently: Take 800-2,400 mg by mouth See admin instructions. 2400 mg by mouth three times daily and an additional 800 mg by mouth with a snack)     Skin Protectants, Misc. (EUCERIN) cream Apply 1 application topically daily.     vancomycin 750 mg in sodium chloride 0.9 % 150 mL Inject 750 mg into the vein Every Tuesday,Thursday,and Saturday with dialysis for 25 days. Stop date 02/13/22 11 Bag 0    Allergies as of 02/05/2022 - Review Complete 02/05/2022  Allergen Reaction Noted   Shrimp (diagnostic) Other (See Comments) 01/17/2022    Family History  Problem Relation Age of Onset   Hypertension Mother    Hypertension Father    Colon cancer Brother    Lung cancer Brother     Social History   Socioeconomic History   Marital status: Widowed    Spouse name: Not on file   Number of children: Not on file   Years of education: Not on file   Highest education level: Not on file  Occupational History   Not on file  Tobacco Use   Smoking status: Never   Smokeless tobacco: Never  Vaping Use   Vaping Use: Never used  Substance and Sexual Activity   Alcohol use: No   Drug use: No   Sexual activity: Not Currently    Birth control/protection: Post-menopausal  Other Topics Concern   Not on file  Social History Narrative   Not on file   Social Determinants of Health   Financial Resource Strain: Not on file  Food Insecurity: Not on file  Transportation Needs: Not on file  Physical Activity: Not on file  Stress: Not on file  Social Connections: Not on file  Intimate Partner Violence: Not on file    Review of Systems: All systems reviewed and negative except where noted in HPI.   OBJECTIVE    Physical Exam: Vital signs in last 24 hours: Temp:  [98.6 F (37 C)-99 F (37.2 C)] 98.6 F (37 C) (03/03 1508) Pulse Rate:  [86-87] 87 (03/03 1508) Resp:  [18-19] 19 (03/03 1508) BP: (149-168)/(85-106) 168/106 (03/03 1508) SpO2:  [100 %] 100 % (03/03  1508) Weight:  [59 kg] 59 kg (03/03 1506)    General:  Alert female in NAD Psych:  Pleasant, cooperative. Normal mood and affect Eyes: Pupils equal, no icterus. Conjunctive pink Ears:  Normal auditory acuity Nose: No deformity, discharge or lesions Neck:  Supple, no masses felt Lungs:  Clear to auscultation.  Heart:  Regular rate.  No lower extremity edema Abdomen:  Soft, nondistended, nontender, active bowel sounds, no masses felt Rectal :  Deferred Msk: Symmetrical without gross deformities.  Neurologic:  Alert, oriented, grossly normal neurologically Rectal: small amount of light brown / tan like loose stool in vault Skin:  Intact without significant lesions.    Scheduled inpatient  medications   Intake/Output from previous day: No intake/output data recorded. Intake/Output this shift: No intake/output data recorded.   Lab Results: Recent Labs    02/05/22 1412  WBC 3.9*  HGB 7.5*  HCT 23.0*  PLT 199   BMET Recent Labs    02/05/22 1412  NA 134*  K 3.3*  CL 96*  CO2 26  GLUCOSE 92  BUN 16  CREATININE 3.90*  CALCIUM 9.4   LFTs Recent Labs    02/05/22 1412  PROT 6.7  ALBUMIN 2.4*  AST 17  ALT 9  ALKPHOS 63  BILITOT 0.5   PT/INR No results for input(s): LABPROT, INR in the last 72 hours. Hepatitis Panel No results for input(s): HEPBSAG, HCVAB, HEPAIGM, HEPBIGM in the last 72 hours.   . CBC Latest Ref Rng & Units 02/05/2022 01/23/2022 01/22/2022  WBC 4.0 - 10.5 K/uL 3.9(L) 6.3 4.2  Hemoglobin 12.0 - 15.0 g/dL 7.5(L) 9.6(L) 9.0(L)  Hematocrit 36.0 - 46.0 % 23.0(L) 30.0(L) 27.4(L)  Platelets 150 - 400 K/uL 199 193 181    . CMP Latest Ref Rng & Units 02/05/2022 01/23/2022 01/21/2022  Glucose 70 - 99 mg/dL 92 98 103(H)  BUN 8 - 23 mg/dL 16 24(H) 10  Creatinine 0.44 - 1.00 mg/dL 3.90(H) 5.29(H) 3.95(H)  Sodium 135 - 145 mmol/L 134(L) 130(L) 137  Potassium 3.5 - 5.1 mmol/L 3.3(L) 4.2 3.7  Chloride 98 - 111 mmol/L 96(L) 93(L) 99  CO2 22 - 32 mmol/L 26  25 28   Calcium 8.9 - 10.3 mg/dL 9.4 10.2 9.3  Total Protein 6.5 - 8.1 g/dL 6.7 - 6.4(L)  Total Bilirubin 0.3 - 1.2 mg/dL 0.5 - 0.6  Alkaline Phos 38 - 126 U/L 63 - 165(H)  AST 15 - 41 U/L 17 - 64(H)  ALT 0 - 44 U/L 9 - 19     Active Problems:   * No active hospital problems. Tye Savoy, NP-C @  02/05/2022, 4:29 PM

## 2022-02-05 NOTE — Assessment & Plan Note (Signed)
Diflucan through 02/08/2022 ?

## 2022-02-05 NOTE — ED Triage Notes (Signed)
Pt last had dialysis yesterday. Was sent here today due to hgb 6.8 and needs GI consult. Pt reports drinking OJ a couple of days ago and having dark diarrhea x 2 days. Pt has no other complaints.  ?

## 2022-02-05 NOTE — Progress Notes (Signed)
Pharmacy Antibiotic Note ? ?Diamond [name] is a 71 y.o. female admitted on 02/05/2022 presenting with low hemoglobin from clinic and dark stools, hx of MRSA bacteremia and lumbar discitis currently on vancomycin tx (through 3/11).  Pharmacy has been consulted for vancomycin dosing.  ESRD-HD usually TTS ? ?Plan: ?Vancomycin 750 mg q iHD ?Monitor HD schedule, clinical progression ?Vancomycin random level as needed ? ?Height: 5\' 7"  (170.2 cm) ?Weight: 59 kg (130 lb 1.1 oz) ?IBW/kg (Calculated) : 61.6 ? ?Temp (24hrs), Avg:98.8 ?F (37.1 ?C), Min:98.6 ?F (37 ?C), Max:99 ?F (37.2 ?C) ? ?Recent Labs  ?Lab 02/05/22 ?1412  ?WBC 3.9*  ?CREATININE 3.90*  ?  ?Estimated Creatinine Clearance: 12.1 mL/min (A) (by C-G formula based on SCr of 3.9 mg/dL (H)).   ? ?Allergies  ?Allergen Reactions  ? Shrimp (Diagnostic) Other (See Comments)  ?  Listed on MAR  ? ? ?Bertis Ruddy, PharmD ?Clinical Pharmacist ?ED Pharmacist Phone # [phone] ?02/05/2022 6:17 PM ? ? ?

## 2022-02-06 DIAGNOSIS — I330 Acute and subacute infective endocarditis: Secondary | ICD-10-CM | POA: Diagnosis not present

## 2022-02-06 DIAGNOSIS — B3781 Candidal esophagitis: Secondary | ICD-10-CM

## 2022-02-06 DIAGNOSIS — D649 Anemia, unspecified: Secondary | ICD-10-CM | POA: Diagnosis not present

## 2022-02-06 LAB — CBC
HCT: 22.6 % — ABNORMAL LOW (ref 36.0–46.0)
HCT: 28.4 % — ABNORMAL LOW (ref 36.0–46.0)
Hemoglobin: 7.5 g/dL — ABNORMAL LOW (ref 12.0–15.0)
Hemoglobin: 9.2 g/dL — ABNORMAL LOW (ref 12.0–15.0)
MCH: 31 pg (ref 26.0–34.0)
MCH: 30 pg (ref 26.0–34.0)
MCHC: 33.2 g/dL (ref 30.0–36.0)
MCHC: 32.4 g/dL (ref 30.0–36.0)
MCV: 93.4 fL (ref 80.0–100.0)
MCV: 92.5 fL (ref 80.0–100.0)
Platelets: 187 10*3/uL (ref 150–400)
Platelets: 173 10*3/uL (ref 150–400)
RBC: 2.42 MIL/uL — ABNORMAL LOW (ref 3.87–5.11)
RBC: 3.07 MIL/uL — ABNORMAL LOW (ref 3.87–5.11)
RDW: 15.9 % — ABNORMAL HIGH (ref 11.5–15.5)
RDW: 17.1 % — ABNORMAL HIGH (ref 11.5–15.5)
WBC: 4.7 10*3/uL (ref 4.0–10.5)
WBC: 4 10*3/uL (ref 4.0–10.5)
nRBC: 0 % (ref 0.0–0.2)
nRBC: 0 % (ref 0.0–0.2)

## 2022-02-06 LAB — BASIC METABOLIC PANEL
Anion gap: 10 (ref 5–15)
BUN: 24 mg/dL — ABNORMAL HIGH (ref 8–23)
CO2: 26 mmol/L (ref 22–32)
Calcium: 9.4 mg/dL (ref 8.9–10.3)
Chloride: 101 mmol/L (ref 98–111)
Creatinine, Ser: 4.46 mg/dL — ABNORMAL HIGH (ref 0.44–1.00)
GFR, Estimated: 10 mL/min — ABNORMAL LOW (ref >60)
Glucose, Bld: 101 mg/dL — ABNORMAL HIGH (ref 70–99)
Potassium: 3.4 mmol/L — ABNORMAL LOW (ref 3.5–5.1)
Sodium: 137 mmol/L (ref 135–145)

## 2022-02-06 LAB — HEMOGLOBIN AND HEMATOCRIT, BLOOD
HCT: 31.1 % — ABNORMAL LOW (ref 36.0–46.0)
Hemoglobin: 9.8 g/dL — ABNORMAL LOW (ref 12.0–15.0)

## 2022-02-06 LAB — PREPARE RBC (CROSSMATCH)

## 2022-02-06 MED ORDER — APIXABAN 2.5 MG PO TABS
2.5000 mg | ORAL_TABLET | Freq: Two times a day (BID) | ORAL | Status: DC
Start: 2022-02-06 — End: 2022-02-06

## 2022-02-06 MED ORDER — LIDOCAINE HCL (PF) 1 % IJ SOLN
5.0000 mL | INTRAMUSCULAR | Status: DC | PRN
  Filled 2022-02-06: qty 5

## 2022-02-06 MED ORDER — ACETAMINOPHEN 325 MG PO TABS
650.0000 mg | ORAL_TABLET | Freq: Once | ORAL | Status: AC
  Administered 2022-02-06: 650 mg via ORAL
  Filled 2022-02-06: qty 2

## 2022-02-06 MED ORDER — DIPHENHYDRAMINE HCL 50 MG/ML IJ SOLN
25.0000 mg | Freq: Once | INTRAMUSCULAR | Status: AC
  Administered 2022-02-06: 25 mg via INTRAVENOUS
  Filled 2022-02-06: qty 1

## 2022-02-06 MED ORDER — ALTEPLASE 2 MG IJ SOLR: 2.0000 mg | Freq: Once | INTRAMUSCULAR | Status: DC | PRN

## 2022-02-06 MED ORDER — SODIUM CHLORIDE 0.9% IV SOLUTION: Freq: Once | INTRAVENOUS | Status: DC

## 2022-02-06 MED ORDER — SODIUM CHLORIDE 0.9 % IV SOLN: 100.0000 mL | INTRAVENOUS | Status: DC | PRN

## 2022-02-06 MED ORDER — PENTAFLUOROPROP-TETRAFLUOROETH EX AERO: 1.0000 "application " | INHALATION_SPRAY | CUTANEOUS | Status: DC | PRN

## 2022-02-06 MED ORDER — HEPARIN SODIUM (PORCINE) 1000 UNIT/ML DIALYSIS: 1000.0000 [IU] | INTRAMUSCULAR | Status: DC | PRN

## 2022-02-06 MED ORDER — LIDOCAINE-PRILOCAINE 2.5-2.5 % EX CREA
1.0000 "application " | TOPICAL_CREAM | CUTANEOUS | Status: DC | PRN
  Filled 2022-02-06: qty 5

## 2022-02-06 MED ORDER — APIXABAN 2.5 MG PO TABS: 2.5000 mg | ORAL_TABLET | Freq: Two times a day (BID) | ORAL | 11 refills | Status: DC

## 2022-02-06 MED ORDER — CHLORHEXIDINE GLUCONATE CLOTH 2 % EX PADS: 6.0000 | MEDICATED_PAD | Freq: Every day | CUTANEOUS | Status: DC

## 2022-02-06 NOTE — Plan of Care (Signed)

## 2022-02-06 NOTE — Progress Notes (Signed)
71 year old female ho  ESRD HD [place] TTS, atrial flutter on eliquis, asthma, gout, HTN, colitis ,recent JAN 2023 admit MRSA infectious endocarditis/discitis IV vancomycin until 02/13/2022, directed to come to ER because of outpatient Hgb 6.4 at [facility] also history of melena x2 days , admitted to observation status with ER Hgb 7.5 in ER yesterday by GI Dr. Carlean Purl  with NO recommendation to hold Eliquis at this time.  With current hemoglobin 7.5 , "transfuse 1 unit packed red blood cells and observe "GI plans to follow-up in OP OV.  Also following up for gallbladder mass recent CT scan questionable advanced adenomyomatosis  versus neoplasm ? ?No current complaints Hgb stable.  Plan for HD today on schedule.  Noted admit team may discharge if stable and no bleeding after dialysis.  We will do consult note if changed from observation to inpatient admission. ? ?Outpatient HD TTS [facility] ,4 hours, 2K, 2 CA bath ?No heparin, R IJ  PC, left  BC AV fistula insert 12/24/21 ?Mircera 60 given 02/05/22, no heparin, no vitamin D, iron sat 10% no Venofer, secondary to continuing received vancomycin for) ? ? ? ? ? ?Ernest Haber, PA-C ?[facility] ?Beeper [phone] ?02/06/2022,12:24 PM ? LOS: 0 days  ? ?Labs: ?Basic Metabolic Pan ?Medications: ? sodium chloride    ? sodium chloride    ? sodium chloride    ? vancomycin    ? ? sodium chloride   Intravenous Once  ? sodium chloride   Intravenous Once  ? allopurinol  100 mg Oral Daily  ? apixaban  2.5 mg Oral BID  ? Chlorhexidine Gluconate Cloth  6 each Topical Daily  ? Chlorhexidine Gluconate Cloth  6 each Topical Q0600  ? cyclobenzaprine  2.5 mg Oral Daily  ? fluconazole  100 mg Oral Daily  ? gabapentin  100 mg Oral QHS  ? hydrALAZINE  50 mg Oral BID  ? metoprolol tartrate  50 mg Oral BID  ? pantoprazole  40 mg Oral BID  ? sodium chloride flush  3 mL Intravenous Q12H  ? ? ? ? ?

## 2022-02-06 NOTE — Discharge Summary (Addendum)
PATIENT DETAILS Name: Diamond [name] Age: 71 y.o. Sex: female Date of Birth: 10-01-1951 MRN: [id number]. Admitting Physician: Orland Mustard, MD [id number], Greggory Stallion, MD  Admit Date: 02/05/2022 Discharge date: 02/06/2022  Recommendations for Outpatient Follow-up:  Follow up with PCP in 1-2 weeks Please obtain CMP/CBC in one week Please repeat CT chest in April 2023-lung nodule-please see prior discharge summary done in January 2023. Please ensure follow-up with general surgery GI-possible gallbladder mass seen on prior imaging in February 2022. Please ensure follow-up with nephrology for continued hemodialysis care.  Admitted From:  Home  Disposition: Home   Discharge Condition: good  CODE STATUS:   Code Status: Full Code   Diet recommendation:  Diet Order             Diet - low sodium heart healthy           Diet renal with fluid restriction Fluid restriction: 1200 mL Fluid; Room service appropriate? Yes; Fluid consistency: Thin  Diet effective now                    Brief Summary: 71 year old female with history of ESRD on HD, atrial flutter on Eliquis, recent MRSA bacteremia with L5-S1 discitis and endocarditis (January 2023), multifactorial anemia (EGD/colonoscopy February 2023 negative)-referred to the ED from HD center for evaluation of anemia and questionable melanotic stools.  Significant events: 1/11-1/25>> admit for sepsis due to MRSA endocarditis-L5-S1 discitis 2/11-2/17>> admit for possible cholecystitis-worsening anemia.  Evaluated by general surgery-not felt to have cholecystitis.  Underwent GI work-up that was negative-did require 1 unit of PRBC. 3/3>> referred to ED from HD center for anemia and possible GI bleeding.  Brief Hospital Course: Multifactorial anemia: Although FOBT positive-no overt evidence of GI bleeding.  No melanotic stools since hospitalization.  Suspect has multifactorial anemia from acute illness/ESRD/recent hospitalization-not  felt to have GI bleeding at this point.  Evaluated by GI-not felt to require repeat endoscopic evaluation.  Recommendations were to observe-anticoagulation was continued.  Patient was transfused 2 units of PRBC-posttransfusion hemoglobin stable at 9.2.  Since no overt bleeding apparent after overnight observation-suspect stable to be discharged home with continued close monitoring in the outpatient setting.  Recent MRSA bacteremia with endocarditis and L5-S1 discitis: Continue vancomycin through 3/11  Candida esophagitis: Continue Diflucan through 3/6  Atrial flutter: Maintaining sinus rhythm-continue Lopressor and Eliquis.  Discussed with pharmacy-recommendations are to decrease Eliquis dosage to 2.5 mg twice daily.  ?  Gallbladder mass: Seen on imaging studies during her most recent hospitalization in February-stable for continued follow-up with general surgery/GI in the outpatient setting.  It appears that patient already has a previously scheduled appointment with general surgery-Dr. Sophronia Simas on 3/14.  ESRD on HD TTS: Nephrology followed closely-stable to resume outpatient HD.  Nephrology aware of discharge plans today.  Lung nodule seen on CT scan in January: Per H&P-plans are to repeat CT chest in April 2023.  Will defer to PCP.  Rest of the patient's medical problems were stable during the short overnight hospital stay.  Note-updated son Mariana Kaufman over the phone on 3/4.  He is aware of the importance of following with general surgery to rule out malignancy in the gallbladder area.  Discharge Diagnoses:  Principal Problem:   Acute on chronic anemia with positive fecal occult  Active Problems:   Hyperkalemia   ESRD on hemodialysis (HCC)   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    Candida esophagitis (HCC)   Atrial flutter (HCC)  Essential hypertension   Asthma   Lung nodule seen on imaging study   Discharge Instructions:  Activity:  As tolerated with  Full fall precautions use walker/cane & assistance as needed Discharge Instructions     Diet - low sodium heart healthy   Complete by: As directed    Discharge instructions   Complete by: As directed    Follow with Primary MD  Jackie Plum, MD in 1-2 weeks  Follow-up with Dr. Mitzi Davenport [facility] on 3/14 as previously instructed for evaluation of gallbladder mass.  Please get a complete blood count and chemistry panel checked by your Primary MD at your next visit, and again as instructed by your Primary MD.  Get Medicines reviewed and adjusted: Please take all your medications with you for your next visit with your Primary MD  Laboratory/radiological data: Please request your Primary MD to go over all hospital tests and procedure/radiological results at the follow up, please ask your Primary MD to get all Hospital records sent to his/her office.  In some cases, they will be blood work, cultures and biopsy results pending at the time of your discharge. Please request that your primary care M.D. follows up on these results.  Also Note the following: If you experience worsening of your admission symptoms, develop shortness of breath, life threatening emergency, suicidal or homicidal thoughts you must seek medical attention immediately by calling 911 or calling your MD immediately  if symptoms less severe.  You must read complete instructions/literature along with all the possible adverse reactions/side effects for all the Medicines you take and that have been prescribed to you. Take any new Medicines after you have completely understood and accpet all the possible adverse reactions/side effects.   Do not drive when taking Pain medications or sleeping medications (Benzodaizepines)  Do not take more than prescribed Pain, Sleep and Anxiety Medications. It is not advisable to combine anxiety,sleep and pain medications without talking with your primary care  practitioner  Special Instructions: If you have smoked or chewed Tobacco  in the last 2 yrs please stop smoking, stop any regular Alcohol  and or any Recreational drug use.  Wear Seat belts while driving.  Please note: You were cared for by a hospitalist during your hospital stay. Once you are discharged, your primary care physician will handle any further medical issues. Please note that NO REFILLS for any discharge medications will be authorized once you are discharged, as it is imperative that you return to your primary care physician (or establish a relationship with a primary care physician if you do not have one) for your post hospital discharge needs so that they can reassess your need for medications and monitor your lab values.   Increase activity slowly   Complete by: As directed    No wound care   Complete by: As directed       Allergies as of 02/06/2022       Reactions   Shrimp (diagnostic) Other (See Comments)   Listed on MAR        Medication List     TAKE these medications    acetaminophen 500 MG tablet Commonly known as: TYLENOL Take 1,000 mg by mouth daily as needed for moderate pain.   albuterol 108 (90 Base) MCG/ACT inhaler Commonly known as: VENTOLIN HFA Inhale 2 puffs into the lungs every 4 (four) hours as needed for wheezing or shortness of breath.   allopurinol 100 MG tablet Commonly known as: ZYLOPRIM Take 100 mg  by mouth daily.   apixaban 2.5 MG Tabs tablet Commonly known as: ELIQUIS Take 1 tablet (2.5 mg total) by mouth 2 (two) times daily. What changed:  medication strength how much to take   cyclobenzaprine 5 MG tablet Commonly known as: FLEXERIL Take 2.5 mg by mouth daily.   fluconazole 100 MG tablet Commonly known as: DIFLUCAN Take 1 tablet (100 mg total) by mouth daily for 17 days.   gabapentin 100 MG capsule Commonly known as: NEURONTIN Take 100 mg by mouth at bedtime.   hydrALAZINE 50 MG tablet Commonly known as:  APRESOLINE Take 1 tablet (50 mg total) by mouth every 8 (eight) hours. What changed: when to take this   Lidocaine 4 % Ptch Apply 1 patch topically daily.   metoprolol tartrate 50 MG tablet Commonly known as: LOPRESSOR Take 1 tablet (50 mg total) by mouth 2 (two) times daily.   oxyCODONE 5 MG immediate release tablet Commonly known as: Roxicodone Take 1 tablet (5 mg total) by mouth every 4 (four) hours as needed for severe pain.   pantoprazole 40 MG tablet Commonly known as: PROTONIX Take 1 tablet (40 mg total) by mouth 2 (two) times daily.   sevelamer carbonate 800 MG tablet Commonly known as: RENVELA Take 2 tablets (1,600 mg total) by mouth 3 (three) times daily with meals.   vancomycin 750 mg in sodium chloride 0.9 % 150 mL Inject 750 mg into the vein Every Tuesday,Thursday,and Saturday with dialysis for 25 days. Stop date 02/13/22        Follow-up Information     Osei-Bonsu, Greggory Stallion, MD. Schedule an appointment as soon as possible for a visit in 1 week(s).   Specialty: Internal Medicine Contact information: [address] [place] [address] [phone]         Meriam Sprague, MD Follow up on 03/25/2022.   Specialties: Cardiology, Radiology Why: Appointment at 2:20 PM Contact information: [address] [place] [address] [phone]         Fritzi Mandes, MD Follow up on 02/16/2022.   Specialty: General Surgery Why: Appointment at 9:40 AM, arrive by 9:10 AM for paperwork and check-in process. Contact information: [address] [place] [address] [phone]                Allergies  Allergen Reactions   Shrimp (Diagnostic) Other (See Comments)    Listed on MAR     Other Procedures/Studies: DG Chest 2 View  Result Date: 02/05/2022 CLINICAL DATA:  Shortness of breath EXAM: CHEST - 2 VIEW COMPARISON:  Chest x-ray dated January 16, 2022 FINDINGS: Cardiac and mediastinal contours  are unchanged. Right chest wall central venous catheter tip with tip positioned near the superior cavoatrial junction. Lungs are clear. No pleural effusion or pneumothorax. IMPRESSION: No active cardiopulmonary disease. Electronically Signed   By: Allegra Lai M.D.   On: 02/05/2022 14:29   MR ABDOMEN MRCP WO CONTRAST  Result Date: 01/17/2022 CLINICAL DATA:  71 year old female with history of abdominal pain and back pain. Possible cholecystitis noted on recent CT examination, in addition to mass-like thickening of the gallbladder fundus. Further evaluation. EXAM: MRI ABDOMEN WITHOUT CONTRAST  (INCLUDING MRCP) TECHNIQUE: Multiplanar multisequence MR imaging of the abdomen was performed. Heavily T2-weighted images of the biliary and pancreatic ducts were obtained, and three-dimensional MRCP images were rendered by post processing. COMPARISON:  No prior abdominal MRI. CTA of the abdomen and pelvis 01/16/2022. FINDINGS: Comment: Today's  study is limited for detection and characterization of visceral and/or vascular lesions by lack of IV gadolinium. Lower chest: Unremarkable. Hepatobiliary: No definite suspicious cystic or solid hepatic lesions are confidently identified on today's noncontrast examination. There is diffuse low signal intensity throughout the hepatic parenchyma on in phase dual echo images, as well as on T2 weighted sequences, suggesting substantial hepatic iron deposition. No intrahepatic biliary ductal dilatation. Common bile duct is mildly dilated measuring 9 mm in the porta hepatis. No filling defect within the common bile duct to suggest choledocholithiasis. There are numerous filling defects within the gallbladder, largest of which appears impacted in the neck of the gallbladder measuring up to 2.8 cm in diameter. Associated with this there is diffuse gallbladder wall thickening and edema, along with a trace amount of pericholecystic fluid. Notably, there is mass-like thickening of the fundus  of the gallbladder (axial image 22 of series 4 and coronal image 23 of series 5) which is heterogeneous in signal intensity on T1 and T2 weighted images and demonstrates internal areas of diffusion restriction, concerning for gallbladder neoplasm (poorly evaluated on today's noncontrast examination). Pancreas: No definite pancreatic mass confidently identified on today's noncontrast examination. Diffuse pancreatic ductal dilatation is noted, with the pancreatic duct measuring up to 5 mm in the body of the pancreas. No peripancreatic fluid collections or inflammatory changes. Spleen: Diffuse low signal intensity throughout the splenic parenchyma on in phase dual echo images and T2 weighted sequences, indicative of splenic iron deposition. Adrenals/Urinary Tract: Numerous T1 hypointense, T2 hyperintense lesions are noted in the kidneys bilaterally, incompletely characterized on today's noncontrast examination, but likely to represent cysts, largest of which is exophytic measuring up to 2.6 cm in the anterolateral aspect of the lower pole of the left kidney. No hydroureteronephrosis in the visualized portions of the abdomen. Bilateral adrenal glands are normal in appearance. Stomach/Bowel: Visualized portions are unremarkable. Vascular/Lymphatic: No aneurysm identified in the visualized abdominal vasculature. Borderline enlarged portacaval lymph node measuring 1 cm in short axis (axial image 17 of series 4) and hepatoduodenal ligament lymph node (axial image 14 of series 4) measuring 1.2 cm in short axis. Other: No significant volume of ascites noted in the visualized portions of the peritoneal cavity. Small fat containing umbilical hernia. Musculoskeletal: No aggressive appearing osseous lesions are noted in the visualized portions of the skeleton. IMPRESSION: 1. Cholelithiasis with evidence suggestive of acute cholecystitis. 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 Prominent borderline enlarged portacaval and hepatoduodenal ligament lymph nodes are noted. Attention to these at time of cholecystectomy is recommended, as the possibility of metastatic disease is not excluded. 2. Hepatic and splenic hemosiderosis. 3. Dilatation of the common bile duct without intrahepatic biliary ductal dilatation. This could be benign and age-related. No obstructing choledocholithiasis identified on today's examination. Additionally, there is no definite obstructing lesion in the pancreatic head or region of the ampulla. Notably, however, there is also some mild diffuse pancreatic ductal dilatation. If there is clinical concern for subtle neoplasm in the region of the pancreatic head or ampulla, repeat abdominal MRI with and without IV gadolinium with MRCP could be considered. Please note, however, that per report from the technologist, the patient refused to follow commands and refused to finish today's examination. Electronically Signed   By: Trudie Reed M.D.   On: 01/17/2022 06:13   NM Hepato W/EF  Result Date: 01/18/2022 CLINICAL DATA:  Recurrent biliary colic EXAM:  NUCLEAR MEDICINE HEPATOBILIARY IMAGING WITH GALLBLADDER EF TECHNIQUE: Sequential images of the abdomen were obtained out to 60 minutes following intravenous administration of radiopharmaceutical. After oral ingestion of Ensure, gallbladder ejection fraction was determined. At 60 min, normal ejection fraction is greater than 33%. RADIOPHARMACEUTICALS:  5.4 mCi Tc-62m  Choletec IV COMPARISON:  MRCP 01/17/2022 FINDINGS: Prompt uptake and biliary excretion of activity by the liver is seen. Gallbladder activity is visualized, consistent with patency of cystic duct. Biliary activity passes into small bowel, consistent with patent common bile duct. Calculated gallbladder ejection fraction is 21 %. (Normal gallbladder ejection fraction with Ensure is greater  than 33%.) IMPRESSION: 1. Patent cystic duct without evidence for acute cholecystitis. 2. Decreased gallbladder ejection fraction at 21% which may reflect underlying biliary dyskinesis. Electronically Signed   By: Signa Kell M.D.   On: 01/18/2022 12:35   DG Chest Port 1 View  Result Date: 01/16/2022 CLINICAL DATA:  Sepsis EXAM: PORTABLE CHEST 1 VIEW COMPARISON:  12/26/2021 FINDINGS: Stable positioning of right IJ central line. Stable cardiomediastinal contours. No focal airspace consolidation, pleural effusion, or pneumothorax. IMPRESSION: No active disease. Electronically Signed   By: Duanne Guess D.O.   On: 01/16/2022 17:24   CT Angio Abd/Pel W and/or Wo Contrast  Result Date: 01/16/2022 CLINICAL DATA:  Lower GI bleed. EXAM: CTA ABDOMEN AND PELVIS WITHOUT AND WITH CONTRAST TECHNIQUE: Multidetector CT imaging of the abdomen and pelvis was performed using the standard protocol during bolus administration of intravenous contrast. Multiplanar reconstructed images and MIPs were obtained and reviewed to evaluate the vascular anatomy. RADIATION DOSE REDUCTION: This exam was performed according to the departmental dose-optimization program which includes automated exposure control, adjustment of the mA and/or kV according to patient size and/or use of iterative reconstruction technique. CONTRAST:  93mL OMNIPAQUE IOHEXOL 350 MG/ML SOLN COMPARISON:  CT abdomen pelvis dated 12/17/2021. FINDINGS: VASCULAR Aorta: Mild atherosclerotic calcification. No aneurysmal dilatation or dissection. The aorta is tortuous. Celiac: Patent without evidence of aneurysm, dissection, vasculitis or significant stenosis. SMA: Patent without evidence of aneurysm, dissection, vasculitis or significant stenosis. Renals: Both renal arteries are patent without evidence of aneurysm, dissection, vasculitis, fibromuscular dysplasia or significant stenosis. IMA: Patent without evidence of aneurysm, dissection, vasculitis or significant  stenosis. Inflow: Mild atherosclerotic calcification. No aneurysmal dilatation or dissection. The iliac arteries are patent. Proximal Outflow: Bilateral common femoral and visualized portions of the superficial and profunda femoral arteries are patent without evidence of aneurysm, dissection, vasculitis or significant stenosis. Veins: The IVC is unremarkable.  No portal venous gas. Review of the MIP images confirms the above findings. NON-VASCULAR Lower chest: There is a 9 mm right middle lobe nodule. Follow-up as per recommendation of CT of 12/17/2021. The visualized lung bases are otherwise clear. No intra-abdominal free air or free fluid. Hepatobiliary: The liver is unremarkable. No intrahepatic biliary dilatation. Several noncalcified gallstones measure up to 2.7 cm. There is a 3.1 x 3.4 cm masslike thickening of the gallbladder fundus. Although this may represent adenomyomatosis, gallbladder malignancy is not excluded. Further characterization with MRI without and with contrast is recommended. There is gallbladder wall edema. Pancreas: Unremarkable. No pancreatic ductal dilatation or surrounding inflammatory changes. Spleen: Small splenic calcification. Adrenals/Urinary Tract: The adrenal glands unremarkable. There is no hydronephrosis or nephrolithiasis on either side. Focus of cortical calcification in the inferior pole of the right kidney. Several small left renal cysts and additional bilateral hypodense lesions which are too small to characterize. The kidneys are mildly atrophic. The visualized ureters and urinary bladder  appear unremarkable. Stomach/Bowel: There is moderate stool throughout the colon. There is no bowel obstruction or active inflammation. The appendix is normal. No evidence of active GI bleed. Lymphatic: No adenopathy. Reproductive: The uterus and ovaries are grossly unremarkable. Other: None Musculoskeletal: Osteopenia with degenerative changes of the spine. There is extensive erosive  changes with endplate irregularity, fragmentation, and sclerotic changes and associated soft tissue thickening involving L4-L5 most consistent with spondylo discitis. Further evaluation with MRI without and with contrast is recommended. IMPRESSION: 1. No evidence of active GI bleed. 2. Cholelithiasis with gallbladder wall edema. If there is clinical concern for acute cholecystitis, further evaluation with ultrasound or HIDA scan recommended. 3. Masslike thickening of the gallbladder fundus may represent advanced adenomyomatosis versus neoplasm. Further evaluation with MRI without and with contrast is recommended. 4. 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 Further evaluation with lumbar spine MRI without and with contrast is recommended. 5. There is a 9 mm right middle lobe nodule. Follow-up as per recommendation of prior CT. 6. Aortic Atherosclerosis (ICD10-I70.0). Electronically Signed   By: Elgie Collard M.D.   On: 01/16/2022 20:58     TODAY-DAY OF DISCHARGE:  Subjective:   Hixie Glauner today has no headache,no chest abdominal pain,no new weakness tingling or numbness, feels much better wants to go home today.   Objective:   Blood pressure (!) 155/92, pulse 72, temperature (!) 97.3 F (36.3 C), resp. rate (!) 21, height 5\' 7"  (1.702 m), weight 63.6 kg, SpO2 96 %.  Intake/Output Summary (Last 24 hours) at 02/06/2022 1403 Last data filed at 02/06/2022 1224 Gross per 24 hour  Intake 1438.67 ml  Output --  Net 1438.67 ml   Filed Weights   02/05/22 1506 02/06/22 1115  Weight: 59 kg 63.6 kg    Exam: Awake Alert, Oriented *3, No new F.N deficits, Normal affect [place].AT,PERRAL Supple Neck,No JVD, No cervical lymphadenopathy appriciated.  Symmetrical Chest wall movement, Good air movement bilaterally, CTAB RRR,No Gallops,Rubs or new Murmurs, No Parasternal Heave +ve B.Sounds, Abd Soft, Non tender, No organomegaly appriciated, No rebound -guarding or rigidity. No Cyanosis, Clubbing or edema, No  new Rash or bruise   PERTINENT RADIOLOGIC STUDIES: DG Chest 2 View  Result Date: 02/05/2022 CLINICAL DATA:  Shortness of breath EXAM: CHEST - 2 VIEW COMPARISON:  Chest x-ray dated January 16, 2022 FINDINGS: Cardiac and mediastinal contours are unchanged. Right chest wall central venous catheter tip with tip positioned near the superior cavoatrial junction. Lungs are clear. No pleural effusion or pneumothorax. IMPRESSION: No active cardiopulmonary disease. Electronically Signed   By: Allegra Lai M.D.   On: 02/05/2022 14:29     PERTINENT LAB RESULTS: CBC: Recent Labs    02/06/22 0159 02/06/22 1250  WBC 4.7 4.0  HGB 7.5* 9.2*  HCT 22.6* 28.4*  PLT 187 173   CMET CMP     Component Value Date/Time   NA 137 02/06/2022 0159   K 3.4 (L) 02/06/2022 0159   CL 101 02/06/2022 0159   CO2 26 02/06/2022 0159   GLUCOSE 101 (H) 02/06/2022 0159   BUN 24 (H) 02/06/2022 0159   CREATININE 4.46 (H) 02/06/2022 0159   CREATININE 5.76 (H) 12/31/2020 1456   CALCIUM 9.4 02/06/2022 0159   PROT 6.7 02/05/2022 1412   ALBUMIN 2.4 (L) 02/05/2022 1412   AST 17 02/05/2022 1412   ALT 9 02/05/2022 1412   ALKPHOS 63 02/05/2022 1412   BILITOT 0.5 02/05/2022 1412   GFRNONAA 10 (L) 02/06/2022 0159   GFRAA 14 (L)  11/12/2019 0344    GFR Estimated Creatinine Clearance: 11.1 mL/min (A) (by C-G formula based on SCr of 4.46 mg/dL (H)). No results for input(s): LIPASE, AMYLASE in the last 72 hours. No results for input(s): CKTOTAL, CKMB, CKMBINDEX, TROPONINI in the last 72 hours. Invalid input(s): POCBNP No results for input(s): DDIMER in the last 72 hours. No results for input(s): HGBA1C in the last 72 hours. No results for input(s): CHOL, HDL, LDLCALC, TRIG, CHOLHDL, LDLDIRECT in the last 72 hours. No results for input(s): TSH, T4TOTAL, T3FREE, THYROIDAB in the last 72 hours.  Invalid input(s): FREET3 No results for input(s): VITAMINB12, FOLATE, FERRITIN, TIBC, IRON, RETICCTPCT in the last 72  hours. Coags: No results for input(s): INR in the last 72 hours.  Invalid input(s): PT Microbiology: No results found for this or any previous visit (from the past 240 hour(s)).  FURTHER DISCHARGE INSTRUCTIONS:  Get Medicines reviewed and adjusted: Please take all your medications with you for your next visit with your Primary MD  Laboratory/radiological data: Please request your Primary MD to go over all hospital tests and procedure/radiological results at the follow up, please ask your Primary MD to get all Hospital records sent to his/her office.  In some cases, they will be blood work, cultures and biopsy results pending at the time of your discharge. Please request that your primary care M.D. goes through all the records of your hospital data and follows up on these results.  Also Note the following: If you experience worsening of your admission symptoms, develop shortness of breath, life threatening emergency, suicidal or homicidal thoughts you must seek medical attention immediately by calling 911 or calling your MD immediately  if symptoms less severe.  You must read complete instructions/literature along with all the possible adverse reactions/side effects for all the Medicines you take and that have been prescribed to you. Take any new Medicines after you have completely understood and accpet all the possible adverse reactions/side effects.   Do not drive when taking Pain medications or sleeping medications (Benzodaizepines)  Do not take more than prescribed Pain, Sleep and Anxiety Medications. It is not advisable to combine anxiety,sleep and pain medications without talking with your primary care practitioner  Special Instructions: If you have smoked or chewed Tobacco  in the last 2 yrs please stop smoking, stop any regular Alcohol  and or any Recreational drug use.  Wear Seat belts while driving.  Please note: You were cared for by a hospitalist during your hospital stay.  Once you are discharged, your primary care physician will handle any further medical issues. Please note that NO REFILLS for any discharge medications will be authorized once you are discharged, as it is imperative that you return to your primary care physician (or establish a relationship with a primary care physician if you do not have one) for your post hospital discharge needs so that they can reassess your need for medications and monitor your lab values.  Total Time spent coordinating discharge including counseling, education and face to face time equals greater than 30 minutes.  SignedJeoffrey Massed 02/06/2022 2:03 PM

## 2022-02-06 NOTE — Progress Notes (Signed)
removed 2559ms net fluid.  gave one unit of blood as ordered. checked cbc after unit of blood came back at 9.2.  gave vancomycin as ordered last hour of treatment. pre bp 144/84 post bp 150/88 pre weight 63.6kg post weight 61.0kg bed scales.  catheter ran well. changed dressing to right chest , packed catheter with heparin.  clamped and capped. ?

## 2022-02-07 ENCOUNTER — Telehealth: Payer: Self-pay | Admitting: Nephrology

## 2022-02-07 LAB — TYPE AND SCREEN
ABO/RH(D): O POS
Antibody Screen: NEGATIVE
Unit division: 0
Unit division: 0

## 2022-02-07 LAB — BPAM RBC
Blood Product Expiration Date: 202304052359
Blood Product Expiration Date: 202304052359
ISSUE DATE / TIME: 202303032226
ISSUE DATE / TIME: 202303041138
Unit Type and Rh: 5100
Unit Type and Rh: 5100

## 2022-02-07 NOTE — Telephone Encounter (Signed)
Transition of care contact from inpatient facility ? ?Date of discharge: 02/06/22 ?Date of contact: 02/07/22 ?Method: Phone ?Spoke to: Patient ? ?Patient contacted to discuss transition of care from recent inpatient hospitalization. Patient was admitted to [facility] from 3/03-3/04/23. with discharge diagnosis of .Multifactorial Anemia, ("GI said continue Eliquis".. ? ?Medication changes were reviewed. ? ?Patient will follow up with his/her outpatient HD unit on: Tue 02/09/22 ? ? ?

## 2022-02-09 ENCOUNTER — Encounter (HOSPITAL_COMMUNITY): Payer: Medicare HMO

## 2022-02-09 DIAGNOSIS — N2581 Secondary hyperparathyroidism of renal origin: Secondary | ICD-10-CM | POA: Diagnosis not present

## 2022-02-09 DIAGNOSIS — N186 End stage renal disease: Secondary | ICD-10-CM | POA: Diagnosis not present

## 2022-02-09 DIAGNOSIS — R6889 Other general symptoms and signs: Secondary | ICD-10-CM | POA: Diagnosis not present

## 2022-02-09 DIAGNOSIS — Z992 Dependence on renal dialysis: Secondary | ICD-10-CM | POA: Diagnosis not present

## 2022-02-11 ENCOUNTER — Encounter (HOSPITAL_COMMUNITY): Payer: Self-pay | Admitting: Radiology

## 2022-02-11 DIAGNOSIS — N2581 Secondary hyperparathyroidism of renal origin: Secondary | ICD-10-CM | POA: Diagnosis not present

## 2022-02-11 DIAGNOSIS — Z992 Dependence on renal dialysis: Secondary | ICD-10-CM | POA: Diagnosis not present

## 2022-02-11 DIAGNOSIS — N186 End stage renal disease: Secondary | ICD-10-CM | POA: Diagnosis not present

## 2022-02-11 DIAGNOSIS — R6889 Other general symptoms and signs: Secondary | ICD-10-CM | POA: Diagnosis not present

## 2022-02-12 ENCOUNTER — Telehealth: Payer: Self-pay

## 2022-02-12 NOTE — Telephone Encounter (Signed)
Attempted to contact patient to reschedule a Palliative Care consult appointment. No answer left a message to return call.  

## 2022-02-13 DIAGNOSIS — R6889 Other general symptoms and signs: Secondary | ICD-10-CM | POA: Diagnosis not present

## 2022-02-13 DIAGNOSIS — N186 End stage renal disease: Secondary | ICD-10-CM | POA: Diagnosis not present

## 2022-02-13 DIAGNOSIS — N2581 Secondary hyperparathyroidism of renal origin: Secondary | ICD-10-CM | POA: Diagnosis not present

## 2022-02-13 DIAGNOSIS — Z992 Dependence on renal dialysis: Secondary | ICD-10-CM | POA: Diagnosis not present

## 2022-02-15 NOTE — Progress Notes (Unsigned)
[facility] [place]  ASSESSMENT / PLAN: Diamond [name] is a 71 y.o. status post left brachiobasilic arteriovenous fistula 12/23/21. Plan second stage transposition ***.   CHIEF COMPLAINT: ESRD in need of HD access  HISTORY OF PRESENT ILLNESS: Diamond [name] is a 71 y.o. female referred to clinic for dialysis access creation.  The patient has a right internal jugular tunneled dialysis catheter with which she dialyzes Tuesday, Thursday, Saturday.  She has not had previous dialysis access surgery before.  She is right-handed.  She denies any history of central venous catheterization, port placement or pacemaker placement.  She has never had trauma or upper extremity surgery.  02/16/22: ***   Past Medical History:  Diagnosis Date   Asthma    Atrial flutter with rapid ventricular response ([place]) 12/25/2021   Chronic kidney disease    dialysis Tues Thurs Sat   Dysrhythmia    PAF 10/2019 in setting of COVID-19 PNA   Gout    Hypertension     Past Surgical History:  Procedure Laterality Date   AV FISTULA PLACEMENT Left 12/23/2021   Procedure: LEFT ARM BRACHIOBASILIC VEIN ARTERIOVENOUS (AV) FISTULA CREATION;  Surgeon: [name], MD;  Location: [facility] OR;  Service: Vascular;  Laterality: Left;   BIOPSY  01/19/2022   Procedure: BIOPSY;  Surgeon: Lavena Bullion, DO;  Location: [place] ENDOSCOPY;  Service: Gastroenterology;;   [place]   COLONOSCOPY Left 01/19/2022   Procedure: COLONOSCOPY;  Surgeon: Lavena Bullion, DO;  Location: [place];  Service: Gastroenterology;  Laterality: Left;   ESOPHAGOGASTRODUODENOSCOPY Left 01/19/2022   Procedure: ESOPHAGOGASTRODUODENOSCOPY (EGD);  Surgeon: Lavena Bullion, DO;  Location[facility] ENDOSCOPY;  Service: Gastroenterology;  Laterality: Left;   INSERTION OF DIALYSIS CATHETER Right 12/23/2021   Procedure: INSERTION OF DIALYSIS CATHETER;  Surgeon: [name], MD;  Location: [place];  Service: Vascular;  Laterality:  Right;   POLYPECTOMY  01/19/2022   Procedure: POLYPECTOMY;  Surgeon: Lavena Bullion, DO;  Location: [place] ENDOSCOPY;  Service: Gastroenterology;;   TEE WITHOUT CARDIOVERSION N/A 12/22/2021   Procedure: TRANSESOPHAGEAL ECHOCARDIOGRAM (TEE);  Surgeon: Jerline Pain, MD;  Location: [facility] ENDOSCOPY;  Service: Cardiovascular;  Laterality: N/A;   ULTRASOUND GUIDANCE FOR VASCULAR ACCESS  12/23/2021   Procedure: ULTRASOUND GUIDANCE FOR VASCULAR ACCESS;  Surgeon: [name], MD;  Location: [facility] OR;  Service: Vascular;;    Family History  Problem Relation Age of Onset   Hypertension Mother    Hypertension Father    Colon cancer Brother    Lung cancer Brother     Social History   Socioeconomic History   Marital status: Widowed    Spouse name: Not on file   Number of children: Not on file   Years of education: Not on file   Highest education level: Not on file  Occupational History   Not on file  Tobacco Use   Smoking status: Never   Smokeless tobacco: Never  Vaping Use   Vaping Use: Never used  Substance and Sexual Activity   Alcohol use: No   Drug use: No   Sexual activity: Not Currently    Birth control/protection: Post-menopausal  Other Topics Concern   Not on file  Social History Narrative   Not on file   Social Determinants of Health   Financial Resource Strain: Not on file  Food Insecurity: Not on file  Transportation Needs: Not on file  Physical Activity: Not on file  Stress: Not on file  Social Connections: Not on file  Intimate Partner Violence: Not on file    Allergies  Allergen Reactions   Shrimp (Diagnostic) Other (See Comments)    Listed on [facility]    Current Outpatient Medications  Medication Sig Dispense Refill   acetaminophen (TYLENOL) 500 MG tablet Take 1,000 mg by mouth daily as needed for moderate pain.     albuterol (VENTOLIN HFA) 108 (90 Base) MCG/ACT inhaler Inhale 2 puffs into the lungs every 4 (four) hours as needed for wheezing or shortness of  breath.     allopurinol (ZYLOPRIM) 100 MG tablet Take 100 mg by mouth daily.     apixaban (ELIQUIS) 2.5 MG TABS tablet Take 1 tablet (2.5 mg total) by mouth 2 (two) times daily. 60 tablet 11   cyclobenzaprine (FLEXERIL) 5 MG tablet Take 2.5 mg by mouth daily.     gabapentin (NEURONTIN) 100 MG capsule Take 100 mg by mouth at bedtime.     hydrALAZINE (APRESOLINE) 50 MG tablet Take 1 tablet (50 mg total) by mouth every 8 (eight) hours. (Patient taking differently: Take 50 mg by mouth 2 (two) times daily.)     Lidocaine 4 % PTCH Apply 1 patch topically daily.     metoprolol tartrate (LOPRESSOR) 50 MG tablet Take 1 tablet (50 mg total) by mouth 2 (two) times daily.     oxyCODONE (ROXICODONE) 5 MG immediate release tablet Take 1 tablet (5 mg total) by mouth every 4 (four) hours as needed for severe pain. 15 tablet 0   pantoprazole (PROTONIX) 40 MG tablet Take 1 tablet (40 mg total) by mouth 2 (two) times daily. 60 tablet 0   sevelamer carbonate (RENVELA) 800 MG tablet Take 2 tablets (1,600 mg total) by mouth 3 (three) times daily with meals. (Patient not taking: Reported on 02/05/2022)     vancomycin 750 mg in sodium chloride 0.9 % 150 mL Inject 750 mg into the vein Every Tuesday,Thursday,and Saturday with dialysis for 25 days. Stop date 02/13/22 11 Bag 0   No current facility-administered medications for this visit.    REVIEW OF SYSTEMS:  '[X]'$  denotes positive finding, '[ ]'$  denotes negative finding Cardiac  Comments:  Chest pain or chest pressure:    Shortness of breath upon exertion:    Short of breath when lying flat:    Irregular heart rhythm:        Vascular    Pain in calf, thigh, or hip brought on by ambulation:    Pain in feet at night that wakes you up from your sleep:     Blood clot in your veins:    Leg swelling:         Pulmonary    Oxygen at home:    Productive cough:     Wheezing:         Neurologic    Sudden weakness in arms or legs:     Sudden numbness in arms or legs:      Sudden onset of difficulty speaking or slurred speech:    Temporary loss of vision in one eye:     Problems with dizziness:         Gastrointestinal    Blood in stool:     Vomited blood:         Genitourinary    Burning when urinating:     Blood in urine:        Psychiatric    Major depression:         Hematologic  Bleeding problems:    Problems with blood clotting too easily:        Skin    Rashes or ulcers:        Constitutional    Fever or chills:      PHYSICAL EXAM There were no vitals filed for this visit.   Constitutional: well appearing. no distress. Appears no nourished.  Neurologic: CN intact. no focal findings. no sensory loss. Psychiatric:  Mood and affect symmetric and appropriate. Eyes:  No icterus. No conjunctival pallor. Ears, nose, throat:  mucous membranes moist. Midline trachea.  Cardiac:  regular rate and rhythm.  Respiratory:  unlabored. Abdominal:  soft, non-tender, non-distended.  Peripheral vascular: 2+ radial and brachial pulses Extremity: no edema. no cyanosis. no pallor.  Skin: no gangrene. no ulceration.  Lymphatic: no Stemmer's sign. no palpable lymphadenopathy.  PERTINENT LABORATORY AND RADIOLOGIC DATA  Most recent CBC CBC Latest Ref Rng & Units 02/06/2022 02/06/2022 02/06/2022  WBC 4.0 - 10.5 K/uL - 4.0 4.7  Hemoglobin 12.0 - 15.0 g/dL 9.8(L) 9.2(L) 7.5(L)  Hematocrit 36.0 - 46.0 % 31.1(L) 28.4(L) 22.6(L)  Platelets 150 - 400 K/uL - 173 187     Most recent CMP CMP Latest Ref Rng & Units 02/06/2022 02/05/2022 01/23/2022  Glucose 70 - 99 mg/dL 101(H) 92 98  BUN 8 - 23 mg/dL 24(H) 16 24(H)  Creatinine 0.44 - 1.00 mg/dL 4.46(H) 3.90(H) 5.29(H)  Sodium 135 - 145 mmol/L 137 134(L) 130(L)  Potassium 3.5 - 5.1 mmol/L 3.4(L) 3.3(L) 4.2  Chloride 98 - 111 mmol/L 101 96(L) 93(L)  CO2 22 - 32 mmol/L '26 26 25  '$ Calcium 8.9 - 10.3 mg/dL 9.4 9.4 10.2  Total Protein 6.5 - 8.1 g/dL - 6.7 -  Total Bilirubin 0.3 - 1.2 mg/dL - 0.5 -  Alkaline Phos 38  - 126 U/L - 63 -  AST 15 - 41 U/L - 17 -  ALT 0 - 44 U/L - 9 -    Renal function Estimated Creatinine Clearance: 11 mL/min (A) (by C-G formula based on SCr of 4.46 mg/dL (H)).  Hgb A1c MFr Bld (%)  Date Value  10/18/2019 5.4    LDL Cholesterol  Date Value Ref Range Status  02/23/2011 80 0 - 99 mg/dL Final    Comment:    See lab report for associated comment(s)    [place]   Patient Name:  SHALEIGH LAUBSCHER  Date of Exam:   09/25/2021  Medical Rec #: [id number]       Accession #:    [id number]  Date of Birth: 01-09-1951      Patient Gender: F  Patient Age:   65 years  Exam Location:  [facility]  Procedure:      VAS [place] UPPER EXT VEIN MAPPING (PRE-OP AVF)  Referring Phys: Erlene Quan CAIN    ---------------------------------------------------------------------------  -----     Indications: Pre-access.   History: CKD III.   Performing Technologist: Ronal Fear RVS, RCS      Examination Guidelines: A complete evaluation includes B-mode imaging,  spectral  Doppler, color Doppler, and power Doppler as needed of all accessible  portions  of each vessel. Bilateral testing is considered an integral part of a  complete  examination. Limited examinations for reoccurring indications may be  performed  as noted.   +-----------------+-------------+----------+--------+   Right Cephalic    Diameter (cm) Depth (cm) Findings   +-----------------+-------------+----------+--------+   Shoulder  0.14                            +-----------------+-------------+----------+--------+   Prox upper arm        0.13                            +-----------------+-------------+----------+--------+   Mid upper arm         0.18                            +-----------------+-------------+----------+--------+   Dist upper arm        0.13                            +-----------------+-------------+----------+--------+   Antecubital fossa     0.14                             +-----------------+-------------+----------+--------+   Prox forearm          0.17                            +-----------------+-------------+----------+--------+   Mid forearm           0.15                            +-----------------+-------------+----------+--------+   Dist forearm          0.15                            +-----------------+-------------+----------+--------+   +-----------------+-------------+----------+--------+   Right Basilic     Diameter (cm) Depth (cm) Findings   +-----------------+-------------+----------+--------+   Prox upper arm        0.25                            +-----------------+-------------+----------+--------+   Mid upper arm         0.24                            +-----------------+-------------+----------+--------+   Dist upper arm        0.23                            +-----------------+-------------+----------+--------+   Antecubital fossa     0.35                            +-----------------+-------------+----------+--------+   Prox forearm          0.13                            +-----------------+-------------+----------+--------+   +-----------------+-------------+----------+--------+   Left Cephalic     Diameter (cm) Depth (cm) Findings   +-----------------+-------------+----------+--------+   Shoulder              0.21                            +-----------------+-------------+----------+--------+  Prox upper arm        0.21                            +-----------------+-------------+----------+--------+   Mid upper arm         0.32                 tortuous   +-----------------+-------------+----------+--------+   Dist upper arm        0.28                            +-----------------+-------------+----------+--------+   Antecubital fossa     0.25                            +-----------------+-------------+----------+--------+   Prox forearm          0.20                             +-----------------+-------------+----------+--------+   Mid forearm           0.21                 tortuous   +-----------------+-------------+----------+--------+   Dist forearm          0.18                            +-----------------+-------------+----------+--------+   +-----------------+-------------+----------+--------+   Left Basilic      Diameter (cm) Depth (cm) Findings   +-----------------+-------------+----------+--------+   Prox upper arm        0.41                            +-----------------+-------------+----------+--------+   Mid upper arm         0.40                            +-----------------+-------------+----------+--------+   Dist upper arm        0.43                            +-----------------+-------------+----------+--------+   Antecubital fossa     0.43                            +-----------------+-------------+----------+--------+   Prox forearm          0.33                            +-----------------+-------------+----------+--------+   Summary: Right: Patent and compressible cephalic and basilic veins.  Left: Patent and compressible cephalic and basilic veins.        The cephalic vein is mildly tortuous in the upper and lower        arm.   *See table(s) above for measurements and observations.     Diagnosing physician: Cyndia Diver. Stanford Breed, MD [facility] Phone Number: [phone] 02/15/2022 8:54 PM   Portions of this report may have been transcribed using voice recognition software.  Every effort has been made to ensure accuracy;  however, inadvertent computerized transcription errors may still be present.

## 2022-02-16 ENCOUNTER — Encounter: Payer: Medicare HMO | Admitting: Vascular Surgery

## 2022-02-16 ENCOUNTER — Inpatient Hospital Stay (HOSPITAL_COMMUNITY): Admission: RE | Admit: 2022-02-16 | Payer: Medicare HMO | Source: Ambulatory Visit

## 2022-02-16 ENCOUNTER — Ambulatory Visit (HOSPITAL_COMMUNITY): Payer: Medicare HMO | Attending: Cardiovascular Disease

## 2022-02-16 DIAGNOSIS — N186 End stage renal disease: Secondary | ICD-10-CM | POA: Diagnosis not present

## 2022-02-16 DIAGNOSIS — N2581 Secondary hyperparathyroidism of renal origin: Secondary | ICD-10-CM | POA: Diagnosis not present

## 2022-02-16 DIAGNOSIS — Z992 Dependence on renal dialysis: Secondary | ICD-10-CM | POA: Diagnosis not present

## 2022-02-16 DIAGNOSIS — R6889 Other general symptoms and signs: Secondary | ICD-10-CM | POA: Diagnosis not present

## 2022-02-18 DIAGNOSIS — Z992 Dependence on renal dialysis: Secondary | ICD-10-CM | POA: Diagnosis not present

## 2022-02-18 DIAGNOSIS — N2581 Secondary hyperparathyroidism of renal origin: Secondary | ICD-10-CM | POA: Diagnosis not present

## 2022-02-18 DIAGNOSIS — R6889 Other general symptoms and signs: Secondary | ICD-10-CM | POA: Diagnosis not present

## 2022-02-18 DIAGNOSIS — N186 End stage renal disease: Secondary | ICD-10-CM | POA: Diagnosis not present

## 2022-02-20 DIAGNOSIS — Z992 Dependence on renal dialysis: Secondary | ICD-10-CM | POA: Diagnosis not present

## 2022-02-20 DIAGNOSIS — N186 End stage renal disease: Secondary | ICD-10-CM | POA: Diagnosis not present

## 2022-02-20 DIAGNOSIS — N2581 Secondary hyperparathyroidism of renal origin: Secondary | ICD-10-CM | POA: Diagnosis not present

## 2022-02-20 DIAGNOSIS — R6889 Other general symptoms and signs: Secondary | ICD-10-CM | POA: Diagnosis not present

## 2022-02-22 ENCOUNTER — Other Ambulatory Visit: Payer: Self-pay

## 2022-02-22 ENCOUNTER — Ambulatory Visit (HOSPITAL_COMMUNITY)
Admission: RE | Admit: 2022-02-22 | Discharge: 2022-02-22 | Disposition: A | Discharge status: Home / Self Care | Payer: Medicare HMO | Source: Ambulatory Visit | Attending: Vascular Surgery | Admitting: Vascular Surgery

## 2022-02-22 DIAGNOSIS — Z992 Dependence on renal dialysis: Secondary | ICD-10-CM | POA: Diagnosis not present

## 2022-02-22 DIAGNOSIS — N186 End stage renal disease: Secondary | ICD-10-CM

## 2022-02-22 NOTE — Progress Notes (Deleted)
[facility] OF [place] ? ?ASSESSMENT / PLAN: ?Diamond [name] is a 71 y.o. status post left brachiobasilic arteriovenous fistula 12/23/21. Plan second stage transposition ***.  ? ?CHIEF COMPLAINT: ESRD in need of HD access ? ?HISTORY OF PRESENT ILLNESS: ?Diamond [name] is a 71 y.o. female referred to clinic for dialysis access creation.  The patient has a right internal jugular tunneled dialysis catheter with which she dialyzes Tuesday, Thursday, Saturday.  She has not had previous dialysis access surgery before.  She is right-handed.  She denies any history of central venous catheterization, port placement or pacemaker placement.  She has never had trauma or upper extremity surgery. ? ?02/16/22: *** ? ? ?Past Medical History:  ?Diagnosis Date  ? Asthma   ? Atrial flutter with rapid ventricular response ([place]) 12/25/2021  ? Chronic kidney disease   ? dialysis Tues Thurs Sat  ? Dysrhythmia   ? PAF 10/2019 in setting of COVID-19 PNA  ? Gout   ? Hypertension   ? ? ?Past Surgical History:  ?Procedure Laterality Date  ? AV FISTULA PLACEMENT Left 12/23/2021  ? Procedure: LEFT ARM BRACHIOBASILIC VEIN ARTERIOVENOUS (AV) FISTULA CREATION;  Surgeon: [name], MD;  Location: [facility];  Service: Vascular;  Laterality: Left;  ? BIOPSY  01/19/2022  ? Procedure: BIOPSY;  Surgeon: Lavena Bullion, DO;  Location: [facility] ENDOSCOPY;  Service: Gastroenterology;;  ? [place]  ? COLONOSCOPY Left 01/19/2022  ? Procedure: COLONOSCOPY;  Surgeon: Lavena Bullion, DO;  Location: [facility]OSCOPY;  Service: Gastroenterology;  Laterality: Left;  ? ESOPHAGOGASTRODUODENOSCOPY Left 01/19/2022  ? Procedure: ESOPHAGOGASTRODUODENOSCOPY (EGD);  Surgeon: Lavena Bullion, DO;  Location: [facility] ENDOSCOPY;  Service: Gastroenterology;  Laterality: Left;  ? INSERTION OF DIALYSIS CATHETER Right 12/23/2021  ? Procedure: INSERTION OF DIALYSIS CATHETER;  Surgeon: [name], MD;  Location: [place];  Service: Vascular;  Laterality:  Right;  ? POLYPECTOMY  01/19/2022  ? Procedure: POLYPECTOMY;  Surgeon: Lavena Bullion, DO;  Location: [place] ENDOSCOPY;  Service: Gastroenterology;;  ? TEE WITHOUT CARDIOVERSION N/A 12/22/2021  ? Procedure: TRANSESOPHAGEAL ECHOCARDIOGRAM (TEE);  Surgeon: Jerline Pain, MD;  Location: [name] ENDOSCOPY;  Service: Cardiovascular;  Laterality: N/A;  ? ULTRASOUND GUIDANCE FOR VASCULAR ACCESS  12/23/2021  ? Procedure: ULTRASOUND GUIDANCE FOR VASCULAR ACCESS;  Surgeon: [name], MD;  Location: [place];  Service: Vascular;;  ? ? ?Family History  ?Problem Relation Age of Onset  ? Hypertension Mother   ? Hypertension Father   ? Colon cancer Brother   ? Lung cancer Brother   ? ? ?Social History  ? ?Socioeconomic History  ? Marital status: Widowed  ?  Spouse name: Not on file  ? Number of children: Not on file  ? Years of education: Not on file  ? Highest education level: Not on file  ?Occupational History  ? Not on file  ?Tobacco Use  ? Smoking status: Never  ? Smokeless tobacco: Never  ?Vaping Use  ? Vaping Use: Never used  ?Substance and Sexual Activity  ? Alcohol use: No  ? Drug use: No  ? Sexual activity: Not Currently  ?  Birth control/protection: Post-menopausal  ?Other Topics Concern  ? Not on file  ?Social History Narrative  ? Not on file  ? ?Social Determinants of Health  ? ?Financial Resource Strain: Not on file  ?Food Insecurity: Not on file  ?Transportation Needs: Not on file  ?Physical Activity: Not on file  ?Stress: Not on file  ?  Social Connections: Not on file  ?Intimate Partner Violence: Not on file  ? ? ?Allergies  ?Allergen Reactions  ? Shrimp (Diagnostic) Other (See Comments)  ?  Listed on MAR  ? ? ?Current Outpatient Medications  ?Medication Sig Dispense Refill  ? acetaminophen (TYLENOL) 500 MG tablet Take 1,000 mg by mouth daily as needed for moderate pain.    ? albuterol (VENTOLIN HFA) 108 (90 Base) MCG/ACT inhaler Inhale 2 puffs into the lungs every 4 (four) hours as needed for wheezing or shortness of  breath.    ? allopurinol (ZYLOPRIM) 100 MG tablet Take 100 mg by mouth daily.    ? apixaban (ELIQUIS) 2.5 MG TABS tablet Take 1 tablet (2.5 mg total) by mouth 2 (two) times daily. 60 tablet 11  ? cyclobenzaprine (FLEXERIL) 5 MG tablet Take 2.5 mg by mouth daily.    ? gabapentin (NEURONTIN) 100 MG capsule Take 100 mg by mouth at bedtime.    ? hydrALAZINE (APRESOLINE) 50 MG tablet Take 1 tablet (50 mg total) by mouth every 8 (eight) hours. (Patient taking differently: Take 50 mg by mouth 2 (two) times daily.)    ? Lidocaine 4 % PTCH Apply 1 patch topically daily.    ? metoprolol tartrate (LOPRESSOR) 50 MG tablet Take 1 tablet (50 mg total) by mouth 2 (two) times daily.    ? oxyCODONE (ROXICODONE) 5 MG immediate release tablet Take 1 tablet (5 mg total) by mouth every 4 (four) hours as needed for severe pain. 15 tablet 0  ? pantoprazole (PROTONIX) 40 MG tablet Take 1 tablet (40 mg total) by mouth 2 (two) times daily. 60 tablet 0  ? sevelamer carbonate (RENVELA) 800 MG tablet Take 2 tablets (1,600 mg total) by mouth 3 (three) times daily with meals. (Patient not taking: Reported on 02/05/2022)    ? ?No current facility-administered medications for this visit.  ? ? ?REVIEW OF SYSTEMS:  ?'[X]'$  denotes positive finding, '[ ]'$  denotes negative finding ?Cardiac  Comments:  ?Chest pain or chest pressure:    ?Shortness of breath upon exertion:    ?Short of breath when lying flat:    ?Irregular heart rhythm:    ?    ?Vascular    ?Pain in calf, thigh, or hip brought on by ambulation:    ?Pain in feet at night that wakes you up from your sleep:     ?Blood clot in your veins:    ?Leg swelling:     ?    ?Pulmonary    ?Oxygen at home:    ?Productive cough:     ?Wheezing:     ?    ?Neurologic    ?Sudden weakness in arms or legs:     ?Sudden numbness in arms or legs:     ?Sudden onset of difficulty speaking or slurred speech:    ?Temporary loss of vision in one eye:     ?Problems with dizziness:     ?    ?Gastrointestinal    ?Blood in  stool:     ?Vomited blood:     ?    ?Genitourinary    ?Burning when urinating:     ?Blood in urine:    ?    ?Psychiatric    ?Major depression:     ?    ?Hematologic    ?Bleeding problems:    ?Problems with blood clotting too easily:    ?    ?Skin    ?Rashes or ulcers:    ?    ?  Constitutional    ?Fever or chills:    ? ? ?PHYSICAL EXAM ?There were no vitals filed for this visit. ? ? ?Constitutional: well appearing. no distress. Appears no nourished.  ?Neurologic: CN intact. no focal findings. no sensory loss. ?Psychiatric:  Mood and affect symmetric and appropriate. ?Eyes:  No icterus. No conjunctival pallor. ?Ears, nose, throat:  mucous membranes moist. Midline trachea.  ?Cardiac:  regular rate and rhythm.  ?Respiratory:  unlabored. ?Abdominal:  soft, non-tender, non-distended.  ?Peripheral vascular: 2+ radial and brachial pulses ?Extremity: no edema. no cyanosis. no pallor.  ?Skin: no gangrene. no ulceration.  ?Lymphatic: no Stemmer's sign. no palpable lymphadenopathy. ? ?PERTINENT LABORATORY AND RADIOLOGIC DATA ? ?Most recent CBC ?CBC Latest Ref Rng & Units 02/06/2022 02/06/2022 02/06/2022  ?WBC 4.0 - 10.5 K/uL - 4.0 4.7  ?Hemoglobin 12.0 - 15.0 g/dL 9.8(L) 9.2(L) 7.5(L)  ?Hematocrit 36.0 - 46.0 % 31.1(L) 28.4(L) 22.6(L)  ?Platelets 150 - 400 K/uL - 173 187  ?  ? ?Most recent CMP ?CMP Latest Ref Rng & Units 02/06/2022 02/05/2022 01/23/2022  ?Glucose 70 - 99 mg/dL 101(H) 92 98  ?BUN 8 - 23 mg/dL 24(H) 16 24(H)  ?Creatinine 0.44 - 1.00 mg/dL 4.46(H) 3.90(H) 5.29(H)  ?Sodium 135 - 145 mmol/L 137 134(L) 130(L)  ?Potassium 3.5 - 5.1 mmol/L 3.4(L) 3.3(L) 4.2  ?Chloride 98 - 111 mmol/L 101 96(L) 93(L)  ?CO2 22 - 32 mmol/L '26 26 25  '$ ?Calcium 8.9 - 10.3 mg/dL 9.4 9.4 10.2  ?Total Protein 6.5 - 8.1 g/dL - 6.7 -  ?Total Bilirubin 0.3 - 1.2 mg/dL - 0.5 -  ?Alkaline Phos 38 - 126 U/L - 63 -  ?AST 15 - 41 U/L - 17 -  ?ALT 0 - 44 U/L - 9 -  ? ? ?Renal function ?CrCl cannot be calculated (Unknown ideal weight.). ? ?Hgb A1c MFr Bld (%)  ?Date  Value  ?10/18/2019 5.4  ? ? ?LDL Cholesterol  ?Date Value Ref Range Status  ?02/23/2011 80 0 - 99 mg/dL Final  ?  Comment:  ?  See lab report for associated comment(s)  ?  ?UPPER EXTREMITY VEIN MAPPING  ?

## 2022-02-23 ENCOUNTER — Encounter: Payer: Medicare HMO | Admitting: Vascular Surgery

## 2022-02-23 DIAGNOSIS — N186 End stage renal disease: Secondary | ICD-10-CM | POA: Diagnosis not present

## 2022-02-23 DIAGNOSIS — N2581 Secondary hyperparathyroidism of renal origin: Secondary | ICD-10-CM | POA: Diagnosis not present

## 2022-02-23 DIAGNOSIS — Z992 Dependence on renal dialysis: Secondary | ICD-10-CM | POA: Diagnosis not present

## 2022-02-24 ENCOUNTER — Other Ambulatory Visit: Payer: Self-pay

## 2022-02-24 DIAGNOSIS — J4530 Mild persistent asthma, uncomplicated: Secondary | ICD-10-CM | POA: Diagnosis not present

## 2022-02-24 DIAGNOSIS — I10 Essential (primary) hypertension: Secondary | ICD-10-CM | POA: Diagnosis not present

## 2022-02-24 NOTE — Patient Outreach (Signed)
[facility]) Care Management ? ?02/24/2022 ? ?Diamond [name] ?07-08-1951 ?[id number] ? ? ?Transition of Care Referral ?  ?Referral Date: 01/25/2022 ?Referral [facility] Liaison ?Date of Discharge:01/22/2022 ?Facility: [facility] ? ? ?Multiple attempts to establish contact with patient without success. No response from letter mailed to patient. Case is being closed at this time.  ? ? ?Plan: ?RN CM will close case. ? ?Enzo Montgomery, RN,BSN,CCM ?[facility] Care Management ?Telephonic Care Management Coordinator ?Direct Phone: [phone] ?Toll Free: [phone] ?Fax: [phone] ? ?

## 2022-02-25 DIAGNOSIS — N2581 Secondary hyperparathyroidism of renal origin: Secondary | ICD-10-CM | POA: Diagnosis not present

## 2022-02-25 DIAGNOSIS — Z992 Dependence on renal dialysis: Secondary | ICD-10-CM | POA: Diagnosis not present

## 2022-02-25 DIAGNOSIS — N186 End stage renal disease: Secondary | ICD-10-CM | POA: Diagnosis not present

## 2022-02-27 DIAGNOSIS — N186 End stage renal disease: Secondary | ICD-10-CM | POA: Diagnosis not present

## 2022-02-27 DIAGNOSIS — N2581 Secondary hyperparathyroidism of renal origin: Secondary | ICD-10-CM | POA: Diagnosis not present

## 2022-02-27 DIAGNOSIS — Z992 Dependence on renal dialysis: Secondary | ICD-10-CM | POA: Diagnosis not present

## 2022-03-01 NOTE — H&P (View-Only) (Signed)
VASCULAR [facility] VEIN SPECIALISTS OF [place] ? ?ASSESSMENT / PLAN: ?Diamond [name] is a 71 y.o. status post left brachiobasilic arteriovenous fistula 12/23/21. Plan second stage transposition on non-dialysis day in the near future.  ? ?CHIEF COMPLAINT: ESRD in need of HD access ? ?HISTORY OF PRESENT ILLNESS: ?Diamond [name] is a 71 y.o. female referred to clinic for dialysis access creation.  The patient has a right internal jugular tunneled dialysis catheter with which she dialyzes Tuesday, Thursday, Saturday.  She has not had previous dialysis access surgery before.  She is right-handed.  She denies any history of central venous catheterization, port placement or pacemaker placement.  She has never had trauma or upper extremity surgery. ? ?02/16/22: returns to discuss second stage basilic vein transposition. Dialyzing via tunneled catheter without difficulty.  ? ?Past Medical History:  ?Diagnosis Date  ? Asthma   ? Atrial flutter with rapid ventricular response ([place]) 12/25/2021  ? Chronic kidney disease   ? dialysis Tues Thurs Sat  ? Dysrhythmia   ? PAF 10/2019 in setting of COVID-19 PNA  ? Gout   ? Hypertension   ? ? ?Past Surgical History:  ?Procedure Laterality Date  ? AV FISTULA PLACEMENT Left 12/23/2021  ? Procedure: LEFT ARM BRACHIOBASILIC VEIN ARTERIOVENOUS (AV) FISTULA CREATION;  Surgeon: [name], MD;  Location: [place];  Service: Vascular;  Laterality: Left;  ? BIOPSY  01/19/2022  ? Procedure: BIOPSY;  Surgeon: Lavena Bullion, DO;  Location: [place] ENDOSCOPY;  Service: Gastroenterology;;  ? [place]  ? COLONOSCOPY Left 01/19/2022  ? Procedure: COLONOSCOPY;  Surgeon: Lavena Bullion, DO;  Location[facility] [place] [facility];  Service: Gastroenterology;  Laterality: Left;  ? ESOPHAGOGASTRODUODENOSCOPY Left 01/19/2022  ? Procedure: ESOPHAGOGASTRODUODENOSCOPY (EGD);  Surgeon: Lavena Bullion, DO;  Location: [facility] ENDOSCOPY;  Service: Gastroenterology;  Laterality: Left;  ? INSERTION OF DIALYSIS CATHETER  Right 12/23/2021  ? Procedure: INSERTION OF DIALYSIS CATHETER;  Surgeon: [name], MD;  Location: [place];  Service: Vascular;  Laterality: Right;  ? POLYPECTOMY  01/19/2022  ? Procedure: POLYPECTOMY;  Surgeon: Lavena Bullion, DO;  Location: [place] ENDOSCOPY;  Service: Gastroenterology;;  ? TEE WITHOUT CARDIOVERSION N/A 12/22/2021  ? Procedure: TRANSESOPHAGEAL ECHOCARDIOGRAM (TEE);  Surgeon: Jerline Pain, MD;  Location: [facility] ENDOSCOPY;  Service: Cardiovascular;  Laterality: N/A;  ? ULTRASOUND GUIDANCE FOR VASCULAR ACCESS  12/23/2021  ? Procedure: ULTRASOUND GUIDANCE FOR VASCULAR ACCESS;  Surgeon: [name], MD;  Location: [place];  Service: Vascular;;  ? ? ?Family History  ?Problem Relation Age of Onset  ? Hypertension Mother   ? Hypertension Father   ? Colon cancer Brother   ? Lung cancer Brother   ? ? ?Social History  ? ?Socioeconomic History  ? Marital status: Widowed  ?  Spouse name: Not on file  ? Number of children: Not on file  ? Years of education: Not on file  ? Highest education level: Not on file  ?Occupational History  ? Not on file  ?Tobacco Use  ? Smoking status: Never  ? Smokeless tobacco: Never  ?Vaping Use  ? Vaping Use: Never used  ?Substance and Sexual Activity  ? Alcohol use: No  ? Drug use: No  ? Sexual activity: Not Currently  ?  Birth control/protection: Post-menopausal  ?Other Topics Concern  ? Not on file  ?Social History Narrative  ? Not on file  ? ?Social Determinants of Health  ? ?Financial Resource Strain: Not on file  ?Food Insecurity: Not  on file  ?Transportation Needs: Not on file  ?Physical Activity: Not on file  ?Stress: Not on file  ?Social Connections: Not on file  ?Intimate Partner Violence: Not on file  ? ? ?Allergies  ?Allergen Reactions  ? Shrimp (Diagnostic) Other (See Comments)  ?  Listed on MAR  ? ? ?Current Outpatient Medications  ?Medication Sig Dispense Refill  ? acetaminophen (TYLENOL) 500 MG tablet Take 1,000 mg by mouth daily as needed for moderate pain.    ?  albuterol (VENTOLIN HFA) 108 (90 Base) MCG/ACT inhaler Inhale 2 puffs into the lungs every 4 (four) hours as needed for wheezing or shortness of breath.    ? allopurinol (ZYLOPRIM) 100 MG tablet Take 100 mg by mouth daily.    ? apixaban (ELIQUIS) 2.5 MG TABS tablet Take 1 tablet (2.5 mg total) by mouth 2 (two) times daily. 60 tablet 11  ? cyclobenzaprine (FLEXERIL) 5 MG tablet Take 2.5 mg by mouth daily.    ? gabapentin (NEURONTIN) 100 MG capsule Take 100 mg by mouth at bedtime.    ? hydrALAZINE (APRESOLINE) 50 MG tablet Take 1 tablet (50 mg total) by mouth every 8 (eight) hours. (Patient taking differently: Take 50 mg by mouth 2 (two) times daily.)    ? Lidocaine 4 % PTCH Apply 1 patch topically daily.    ? metoprolol tartrate (LOPRESSOR) 50 MG tablet Take 1 tablet (50 mg total) by mouth 2 (two) times daily.    ? oxyCODONE (ROXICODONE) 5 MG immediate release tablet Take 1 tablet (5 mg total) by mouth every 4 (four) hours as needed for severe pain. 15 tablet 0  ? pantoprazole (PROTONIX) 40 MG tablet Take 1 tablet (40 mg total) by mouth 2 (two) times daily. 60 tablet 0  ? sevelamer carbonate (RENVELA) 800 MG tablet Take 2 tablets (1,600 mg total) by mouth 3 (three) times daily with meals.    ? ?No current facility-administered medications for this visit.  ? ? ?REVIEW OF SYSTEMS:  ?'[X]'$  denotes positive finding, '[ ]'$  denotes negative finding ?Cardiac  Comments:  ?Chest pain or chest pressure:    ?Shortness of breath upon exertion:    ?Short of breath when lying flat:    ?Irregular heart rhythm:    ?    ?Vascular    ?Pain in calf, thigh, or hip brought on by ambulation:    ?Pain in feet at night that wakes you up from your sleep:     ?Blood clot in your veins:    ?Leg swelling:     ?    ?Pulmonary    ?Oxygen at home:    ?Productive cough:     ?Wheezing:     ?    ?Neurologic    ?Sudden weakness in arms or legs:     ?Sudden numbness in arms or legs:     ?Sudden onset of difficulty speaking or slurred speech:    ?Temporary  loss of vision in one eye:     ?Problems with dizziness:     ?    ?Gastrointestinal    ?Blood in stool:     ?Vomited blood:     ?    ?Genitourinary    ?Burning when urinating:     ?Blood in urine:    ?    ?Psychiatric    ?Major depression:     ?    ?Hematologic    ?Bleeding problems:    ?Problems with blood clotting too easily:    ?    ?  Skin    ?Rashes or ulcers:    ?    ?Constitutional    ?Fever or chills:    ? ? ?PHYSICAL EXAM ?Vitals:  ? 03/02/22 1014  ?BP: (!) 181/92  ?Pulse: 81  ?Resp: 20  ?Temp: 98.1 ?F (36.7 ?C)  ?SpO2: 100%  ?Weight: 132 lb (59.9 kg)  ?Height: '5\' 7"'$  (1.702 m)  ? ? ? ?Constitutional: well appearing. no distress. Appears no nourished.  ?Neurologic: CN intact. no focal findings. no sensory loss. ?Psychiatric:  Mood and affect symmetric and appropriate. ?Eyes:  No icterus. No conjunctival pallor. ?Ears, nose, throat:  mucous membranes moist. Midline trachea.  ?Cardiac:  regular rate and rhythm.  ?Respiratory:  unlabored. ?Abdominal:  soft, non-tender, non-distended.  ?Peripheral vascular: strong thrill in left arm fistula ?Extremity: no edema. no cyanosis. no pallor.  ?Skin: no gangrene. no ulceration.  ?Lymphatic: no Stemmer's sign. no palpable lymphadenopathy. ? ?PERTINENT LABORATORY AND RADIOLOGIC DATA ? ?Most recent CBC ? ?  Latest Ref Rng & Units 02/06/2022  ?  2:55 PM 02/06/2022  ? 12:50 PM 02/06/2022  ?  1:59 AM  ?CBC  ?WBC 4.0 - 10.5 K/uL  4.0   4.7    ?Hemoglobin 12.0 - 15.0 g/dL 9.8   9.2   7.5    ?Hematocrit 36.0 - 46.0 % 31.1   28.4   22.6    ?Platelets 150 - 400 K/uL  173   187    ?  ? ?Most recent CMP ? ?  Latest Ref Rng & Units 02/06/2022  ?  1:59 AM 02/05/2022  ?  2:12 PM 01/23/2022  ? 12:51 PM  ?CMP  ?Glucose 70 - 99 mg/dL 101   92   98    ?BUN 8 - 23 mg/dL '24   16   24    '$ ?Creatinine 0.44 - 1.00 mg/dL 4.46   3.90   5.29    ?Sodium 135 - 145 mmol/L 137   134   130    ?Potassium 3.5 - 5.1 mmol/L 3.4   3.3   4.2    ?Chloride 98 - 111 mmol/L 101   96   93    ?CO2 22 - 32 mmol/L '26   26   25     '$ ?Calcium 8.9 - 10.3 mg/dL 9.4   9.4   10.2    ?Total Protein 6.5 - 8.1 g/dL  6.7     ?Total Bilirubin 0.3 - 1.2 mg/dL  0.5     ?Alkaline Phos 38 - 126 U/L  63     ?AST 15 - 41 U/L  17     ?ALT 0 - 44 U

## 2022-03-01 NOTE — Progress Notes (Signed)
[facility] [place] ? ?ASSESSMENT / PLAN: ?Diamond [name] is a 71 y.o. status post left brachiobasilic arteriovenous fistula 12/23/21. Plan second stage transposition on non-dialysis day in the near future.  ? ?CHIEF COMPLAINT: ESRD in need of HD access ? ?HISTORY OF PRESENT ILLNESS: ?Diamond [name] is a 71 y.o. female referred to clinic for dialysis access creation.  The patient has a right internal jugular tunneled dialysis catheter with which she dialyzes Tuesday, Thursday, Saturday.  She has not had previous dialysis access surgery before.  She is right-handed.  She denies any history of central venous catheterization, port placement or pacemaker placement.  She has never had trauma or upper extremity surgery. ? ?02/16/22: returns to discuss second stage basilic vein transposition. Dialyzing via tunneled catheter without difficulty.  ? ?Past Medical History:  ?Diagnosis Date  ? Asthma   ? Atrial flutter with rapid ventricular response ([place]) 12/25/2021  ? Chronic kidney disease   ? dialysis Tues Thurs Sat  ? Dysrhythmia   ? PAF 10/2019 in setting of COVID-19 PNA  ? Gout   ? Hypertension   ? ? ?Past Surgical History:  ?Procedure Laterality Date  ? AV FISTULA PLACEMENT Left 12/23/2021  ? Procedure: LEFT ARM BRACHIOBASILIC VEIN ARTERIOVENOUS (AV) FISTULA CREATION;  Surgeon: [name], MD;  Location: [facility];  Service: Vascular;  Laterality: Left;  ? BIOPSY  01/19/2022  ? Procedure: BIOPSY;  Surgeon: Lavena Bullion, DO;  Location: [facility] ENDOSCOPY;  Service: Gastroenterology;;  ? [place]  ? COLONOSCOPY Left 01/19/2022  ? Procedure: COLONOSCOPY;  Surgeon: Lavena Bullion, DO;  Location: [facility] ENDOSCOPY;  Service: Gastroenterology;  Laterality: Left;  ? ESOPHAGOGASTRODUODENOSCOPY Left 01/19/2022  ? Procedure: ESOPHAGOGASTRODUODENOSCOPY (EGD);  Surgeon: Lavena Bullion, DO;  Location: [facility] ENDOSCOPY;  Service: Gastroenterology;  Laterality: Left;  ? INSERTION OF DIALYSIS CATHETER  Right 12/23/2021  ? Procedure: INSERTION OF DIALYSIS CATHETER;  Surgeon: [name], MD;  Location: [place];  Service: Vascular;  Laterality: Right;  ? POLYPECTOMY  01/19/2022  ? Procedure: POLYPECTOMY;  Surgeon: Lavena Bullion, DO;  Location: [facility] ENDOSCOPY;  Service: Gastroenterology;;  ? TEE WITHOUT CARDIOVERSION N/A 12/22/2021  ? Procedure: TRANSESOPHAGEAL ECHOCARDIOGRAM (TEE);  Surgeon: Jerline Pain, MD;  Location: [facility] END[facility]COPY;  Service: Cardiovascular;  Laterality: N/A;  ? ULTRASOUND GUIDANCE FOR VASCULAR ACCESS  12/23/2021  ? Procedure: ULTRASOUND GUIDANCE FOR VASCULAR ACCESS;  Surgeon: [name], MD;  Location: [place];  Service: Vascular;;  ? ? ?Family History  ?Problem Relation Age of Onset  ? Hypertension Mother   ? Hypertension Father   ? Colon cancer Brother   ? Lung cancer Brother   ? ? ?Social History  ? ?Socioeconomic History  ? Marital status: Widowed  ?  Spouse name: Not on file  ? Number of children: Not on file  ? Years of education: Not on file  ? Highest education level: Not on file  ?Occupational History  ? Not on file  ?Tobacco Use  ? Smoking status: Never  ? Smokeless tobacco: Never  ?Vaping Use  ? Vaping Use: Never used  ?Substance and Sexual Activity  ? Alcohol use: No  ? Drug use: No  ? Sexual activity: Not Currently  ?  Birth control/protection: Post-menopausal  ?Other Topics Concern  ? Not on file  ?Social History Narrative  ? Not on file  ? ?Social Determinants of Health  ? ?Financial Resource Strain: Not on file  ?Food Insecurity: Not  on file  ?Transportation Needs: Not on file  ?Physical Activity: Not on file  ?Stress: Not on file  ?Social Connections: Not on file  ?Intimate Partner Violence: Not on file  ? ? ?Allergies  ?Allergen Reactions  ? Shrimp (Diagnostic) Other (See Comments)  ?  Listed on MAR  ? ? ?Current Outpatient Medications  ?Medication Sig Dispense Refill  ? acetaminophen (TYLENOL) 500 MG tablet Take 1,000 mg by mouth daily as needed for moderate pain.    ?  albuterol (VENTOLIN HFA) 108 (90 Base) MCG/ACT inhaler Inhale 2 puffs into the lungs every 4 (four) hours as needed for wheezing or shortness of breath.    ? allopurinol (ZYLOPRIM) 100 MG tablet Take 100 mg by mouth daily.    ? apixaban (ELIQUIS) 2.5 MG TABS tablet Take 1 tablet (2.5 mg total) by mouth 2 (two) times daily. 60 tablet 11  ? cyclobenzaprine (FLEXERIL) 5 MG tablet Take 2.5 mg by mouth daily.    ? gabapentin (NEURONTIN) 100 MG capsule Take 100 mg by mouth at bedtime.    ? hydrALAZINE (APRESOLINE) 50 MG tablet Take 1 tablet (50 mg total) by mouth every 8 (eight) hours. (Patient taking differently: Take 50 mg by mouth 2 (two) times daily.)    ? Lidocaine 4 % PTCH Apply 1 patch topically daily.    ? metoprolol tartrate (LOPRESSOR) 50 MG tablet Take 1 tablet (50 mg total) by mouth 2 (two) times daily.    ? oxyCODONE (ROXICODONE) 5 MG immediate release tablet Take 1 tablet (5 mg total) by mouth every 4 (four) hours as needed for severe pain. 15 tablet 0  ? pantoprazole (PROTONIX) 40 MG tablet Take 1 tablet (40 mg total) by mouth 2 (two) times daily. 60 tablet 0  ? sevelamer carbonate (RENVELA) 800 MG tablet Take 2 tablets (1,600 mg total) by mouth 3 (three) times daily with meals.    ? ?No current facility-administered medications for this visit.  ? ? ?REVIEW OF SYSTEMS:  ?'[X]'$  denotes positive finding, '[ ]'$  denotes negative finding ?Cardiac  Comments:  ?Chest pain or chest pressure:    ?Shortness of breath upon exertion:    ?Short of breath when lying flat:    ?Irregular heart rhythm:    ?    ?Vascular    ?Pain in calf, thigh, or hip brought on by ambulation:    ?Pain in feet at night that wakes you up from your sleep:     ?Blood clot in your veins:    ?Leg swelling:     ?    ?Pulmonary    ?Oxygen at home:    ?Productive cough:     ?Wheezing:     ?    ?Neurologic    ?Sudden weakness in arms or legs:     ?Sudden numbness in arms or legs:     ?Sudden onset of difficulty speaking or slurred speech:    ?Temporary  loss of vision in one eye:     ?Problems with dizziness:     ?    ?Gastrointestinal    ?Blood in stool:     ?Vomited blood:     ?    ?Genitourinary    ?Burning when urinating:     ?Blood in urine:    ?    ?Psychiatric    ?Major depression:     ?    ?Hematologic    ?Bleeding problems:    ?Problems with blood clotting too easily:    ?    ?  Skin    ?Rashes or ulcers:    ?    ?Constitutional    ?Fever or chills:    ? ? ?PHYSICAL EXAM ?Vitals:  ? 03/02/22 1014  ?BP: (!) 181/92  ?Pulse: 81  ?Resp: 20  ?Temp: 98.1 ?F (36.7 ?C)  ?SpO2: 100%  ?Weight: 132 lb (59.9 kg)  ?Height: '5\' 7"'$  (1.702 m)  ? ? ? ?Constitutional: well appearing. no distress. Appears no nourished.  ?Neurologic: CN intact. no focal findings. no sensory loss. ?Psychiatric:  Mood and affect symmetric and appropriate. ?Eyes:  No icterus. No conjunctival pallor. ?Ears, nose, throat:  mucous membranes moist. Midline trachea.  ?Cardiac:  regular rate and rhythm.  ?Respiratory:  unlabored. ?Abdominal:  soft, non-tender, non-distended.  ?Peripheral vascular: strong thrill in left arm fistula ?Extremity: no edema. no cyanosis. no pallor.  ?Skin: no gangrene. no ulceration.  ?Lymphatic: no Stemmer's sign. no palpable lymphadenopathy. ? ?PERTINENT LABORATORY AND RADIOLOGIC DATA ? ?Most recent CBC ? ?  Latest Ref Rng & Units 02/06/2022  ?  2:55 PM 02/06/2022  ? 12:50 PM 02/06/2022  ?  1:59 AM  ?CBC  ?WBC 4.0 - 10.5 K/uL  4.0   4.7    ?Hemoglobin 12.0 - 15.0 g/dL 9.8   9.2   7.5    ?Hematocrit 36.0 - 46.0 % 31.1   28.4   22.6    ?Platelets 150 - 400 K/uL  173   187    ?  ? ?Most recent CMP ? ?  Latest Ref Rng & Units 02/06/2022  ?  1:59 AM 02/05/2022  ?  2:12 PM 01/23/2022  ? 12:51 PM  ?CMP  ?Glucose 70 - 99 mg/dL 101   92   98    ?BUN 8 - 23 mg/dL '24   16   24    '$ ?Creatinine 0.44 - 1.00 mg/dL 4.46   3.90   5.29    ?Sodium 135 - 145 mmol/L 137   134   130    ?Potassium 3.5 - 5.1 mmol/L 3.4   3.3   4.2    ?Chloride 98 - 111 mmol/L 101   96   93    ?CO2 22 - 32 mmol/L '26   26   25     '$ ?Calcium 8.9 - 10.3 mg/dL 9.4   9.4   10.2    ?Total Protein 6.5 - 8.1 g/dL  6.7     ?Total Bilirubin 0.3 - 1.2 mg/dL  0.5     ?Alkaline Phos 38 - 126 U/L  63     ?AST 15 - 41 U/L  17     ?ALT 0 - 44 U

## 2022-03-02 ENCOUNTER — Other Ambulatory Visit: Payer: Self-pay

## 2022-03-02 ENCOUNTER — Ambulatory Visit (INDEPENDENT_AMBULATORY_CARE_PROVIDER_SITE_OTHER): Payer: Medicare HMO | Admitting: Vascular Surgery

## 2022-03-02 VITALS — BP 181/92 | HR 81 | Temp 98.1°F | Resp 20 | Ht 67.0 in | Wt 132.0 lb

## 2022-03-02 DIAGNOSIS — Z992 Dependence on renal dialysis: Secondary | ICD-10-CM | POA: Diagnosis not present

## 2022-03-02 DIAGNOSIS — N186 End stage renal disease: Secondary | ICD-10-CM

## 2022-03-02 DIAGNOSIS — N2581 Secondary hyperparathyroidism of renal origin: Secondary | ICD-10-CM | POA: Diagnosis not present

## 2022-03-03 ENCOUNTER — Ambulatory Visit (HOSPITAL_COMMUNITY): Payer: Medicare HMO | Attending: Cardiology

## 2022-03-03 DIAGNOSIS — I5189 Other ill-defined heart diseases: Secondary | ICD-10-CM | POA: Diagnosis not present

## 2022-03-03 LAB — ECHOCARDIOGRAM COMPLETE
Area-P 1/2: 3.05 cm2
S' Lateral: 3 cm

## 2022-03-05 ENCOUNTER — Encounter (HOSPITAL_COMMUNITY): Payer: Self-pay | Admitting: Vascular Surgery

## 2022-03-05 ENCOUNTER — Other Ambulatory Visit: Payer: Self-pay

## 2022-03-05 DIAGNOSIS — Z992 Dependence on renal dialysis: Secondary | ICD-10-CM | POA: Diagnosis not present

## 2022-03-05 DIAGNOSIS — I129 Hypertensive chronic kidney disease with stage 1 through stage 4 chronic kidney disease, or unspecified chronic kidney disease: Secondary | ICD-10-CM | POA: Diagnosis not present

## 2022-03-05 DIAGNOSIS — N186 End stage renal disease: Secondary | ICD-10-CM | POA: Diagnosis not present

## 2022-03-05 NOTE — Progress Notes (Addendum)
PCP - Dr. Darnell Level. Osei-Bonsu ? ?Cardiologist - Dr. Shary Key ? ?EP- Denies ? ?Endocrine- Denies ? ?Pulm- Denies ? ?Chest x-ray - 02/05/22 (E) ? ?EKG - 02/05/22 (E) ? ?Stress Test - Denies ? ?ECHO - 03/03/22 (E) ? ?Cardiac Cath - Denies ? ?AICD-na ?PM-na ?LOOP-na ? ?Nerve Stimulator- Denies ? ?Dialysis- T-Th-S- Pt states she missed her session on 03/04/22 due to her having a headache and not feeling well. Pt states she feels much better today, and she will make her session on 03/06/22. ? ?Sleep Study - Denies ?CPAP - Denies ? ?LABS- 03/08/22: I-Stat 8, PCR ? ?ASA- Denies ?Eliquis- LD- 3/31 pm- orders to hold x 2days- Medication list instructions given to her daughter [name], as the pt is unfamiliar with her medications. ? ?ERAS- No ? ?HA1C- Denies ? ?Anesthesia- No ? ?Pt denies having chest pain, sob, or fever during the pre-op phone call. All instructions explained to the pt, with a verbal understanding of the material including: as of today,  stop taking all Aspirin (unless instructed by your doctor) and Other Aspirin containing products, Vitamins, Fish oils, and Herbal medications. Also stop all NSAIDS i.e. Advil, Ibuprofen, Motrin, Aleve, Anaprox, Naproxen, BC, Goody Powders, and all Supplements.  ?Patients discharged the day of surgery will not be allowed to drive home, and someone age 17 and over needs to stay with them for 24 hours. Pt also instructed to wear a mask and social distance if she goes out. The opportunity to ask questions was provided.  ?

## 2022-03-06 DIAGNOSIS — Z992 Dependence on renal dialysis: Secondary | ICD-10-CM | POA: Diagnosis not present

## 2022-03-06 DIAGNOSIS — N186 End stage renal disease: Secondary | ICD-10-CM | POA: Diagnosis not present

## 2022-03-06 DIAGNOSIS — N2581 Secondary hyperparathyroidism of renal origin: Secondary | ICD-10-CM | POA: Diagnosis not present

## 2022-03-08 ENCOUNTER — Ambulatory Visit (HOSPITAL_COMMUNITY): Payer: Medicare HMO | Admitting: Critical Care Medicine

## 2022-03-08 ENCOUNTER — Other Ambulatory Visit: Payer: Self-pay

## 2022-03-08 ENCOUNTER — Encounter (HOSPITAL_COMMUNITY): Payer: Self-pay | Admitting: Vascular Surgery

## 2022-03-08 ENCOUNTER — Ambulatory Visit (HOSPITAL_BASED_OUTPATIENT_CLINIC_OR_DEPARTMENT_OTHER): Payer: Medicare HMO | Admitting: Critical Care Medicine

## 2022-03-08 ENCOUNTER — Ambulatory Visit (HOSPITAL_COMMUNITY)
Admission: RE | Admit: 2022-03-08 | Discharge: 2022-03-08 | Disposition: A | Discharge status: Home / Self Care | Payer: Medicare HMO | Source: Ambulatory Visit | Attending: Vascular Surgery | Admitting: Vascular Surgery

## 2022-03-08 ENCOUNTER — Encounter (HOSPITAL_COMMUNITY)
Admission: RE | Disposition: A | Discharge status: Home / Self Care | Payer: Self-pay | Source: Ambulatory Visit | Attending: Vascular Surgery

## 2022-03-08 DIAGNOSIS — J45909 Unspecified asthma, uncomplicated: Secondary | ICD-10-CM | POA: Diagnosis not present

## 2022-03-08 DIAGNOSIS — N186 End stage renal disease: Secondary | ICD-10-CM

## 2022-03-08 DIAGNOSIS — Z992 Dependence on renal dialysis: Secondary | ICD-10-CM

## 2022-03-08 DIAGNOSIS — I120 Hypertensive chronic kidney disease with stage 5 chronic kidney disease or end stage renal disease: Secondary | ICD-10-CM | POA: Insufficient documentation

## 2022-03-08 DIAGNOSIS — M1990 Unspecified osteoarthritis, unspecified site: Secondary | ICD-10-CM | POA: Diagnosis not present

## 2022-03-08 DIAGNOSIS — N183 Chronic kidney disease, stage 3 unspecified: Secondary | ICD-10-CM

## 2022-03-08 DIAGNOSIS — I4891 Unspecified atrial fibrillation: Secondary | ICD-10-CM | POA: Insufficient documentation

## 2022-03-08 DIAGNOSIS — D631 Anemia in chronic kidney disease: Secondary | ICD-10-CM

## 2022-03-08 HISTORY — DX: Anemia, unspecified: D64.9

## 2022-03-08 HISTORY — PX: BASCILIC VEIN TRANSPOSITION: SHX5742

## 2022-03-08 LAB — POCT I-STAT, CHEM 8
BUN: 24 mg/dL — ABNORMAL HIGH (ref 8–23)
Calcium, Ion: 1.12 mmol/L — ABNORMAL LOW (ref 1.15–1.40)
Chloride: 97 mmol/L — ABNORMAL LOW (ref 98–111)
Creatinine, Ser: 6.3 mg/dL — ABNORMAL HIGH (ref 0.44–1.00)
Glucose, Bld: 86 mg/dL (ref 70–99)
HCT: 27 % — ABNORMAL LOW (ref 36.0–46.0)
Hemoglobin: 9.2 g/dL — ABNORMAL LOW (ref 12.0–15.0)
Potassium: 3.3 mmol/L — ABNORMAL LOW (ref 3.5–5.1)
Sodium: 136 mmol/L (ref 135–145)
TCO2: 28 mmol/L (ref 22–32)

## 2022-03-08 LAB — SURGICAL PCR SCREEN
MRSA, PCR: NEGATIVE
Staphylococcus aureus: NEGATIVE

## 2022-03-08 SURGERY — TRANSPOSITION, VEIN, BASILIC
Anesthesia: Regional | Site: Arm Upper | Laterality: Left

## 2022-03-08 MED ORDER — PROPOFOL 10 MG/ML IV BOLUS
INTRAVENOUS | Status: DC | PRN
  Administered 2022-03-08: 120 mg via INTRAVENOUS
  Administered 2022-03-08: 20 mg via INTRAVENOUS
  Administered 2022-03-08: 60 mg via INTRAVENOUS

## 2022-03-08 MED ORDER — CEFAZOLIN SODIUM-DEXTROSE 2-4 GM/100ML-% IV SOLN
2.0000 g | INTRAVENOUS | Status: AC
  Administered 2022-03-08: 2 g via INTRAVENOUS

## 2022-03-08 MED ORDER — CHLORHEXIDINE GLUCONATE 0.12 % MT SOLN
OROMUCOSAL | Status: AC
  Administered 2022-03-08: 15 mL via OROMUCOSAL
  Filled 2022-03-08: qty 15

## 2022-03-08 MED ORDER — CEFAZOLIN SODIUM-DEXTROSE 2-4 GM/100ML-% IV SOLN
INTRAVENOUS | Status: AC
  Filled 2022-03-08: qty 100

## 2022-03-08 MED ORDER — SODIUM CHLORIDE 0.9 % IV SOLN: INTRAVENOUS | Status: DC

## 2022-03-08 MED ORDER — FENTANYL CITRATE (PF) 250 MCG/5ML IJ SOLN
INTRAMUSCULAR | Status: DC | PRN
  Administered 2022-03-08 (×2): 25 ug via INTRAVENOUS
  Administered 2022-03-08: 50 ug via INTRAVENOUS

## 2022-03-08 MED ORDER — PROMETHAZINE HCL 25 MG/ML IJ SOLN: 6.2500 mg | INTRAMUSCULAR | Status: DC | PRN

## 2022-03-08 MED ORDER — ORAL CARE MOUTH RINSE: 15.0000 mL | Freq: Once | OROMUCOSAL | Status: AC

## 2022-03-08 MED ORDER — OXYCODONE HCL 5 MG/5ML PO SOLN: 5.0000 mg | Freq: Once | ORAL | Status: DC | PRN

## 2022-03-08 MED ORDER — DEXAMETHASONE SODIUM PHOSPHATE 10 MG/ML IJ SOLN
INTRAMUSCULAR | Status: DC | PRN
  Administered 2022-03-08: 4 mg via INTRAVENOUS

## 2022-03-08 MED ORDER — LIDOCAINE 2% (20 MG/ML) 5 ML SYRINGE
INTRAMUSCULAR | Status: DC | PRN
  Administered 2022-03-08: 60 mg via INTRAVENOUS

## 2022-03-08 MED ORDER — PHENYLEPHRINE 40 MCG/ML (10ML) SYRINGE FOR IV PUSH (FOR BLOOD PRESSURE SUPPORT)
PREFILLED_SYRINGE | INTRAVENOUS | Status: DC | PRN
  Administered 2022-03-08 (×2): 80 ug via INTRAVENOUS
  Administered 2022-03-08 (×2): 120 ug via INTRAVENOUS

## 2022-03-08 MED ORDER — CHLORHEXIDINE GLUCONATE 0.12 % MT SOLN: 15.0000 mL | Freq: Once | OROMUCOSAL | Status: AC

## 2022-03-08 MED ORDER — FENTANYL CITRATE (PF) 250 MCG/5ML IJ SOLN
INTRAMUSCULAR | Status: AC
  Filled 2022-03-08: qty 5

## 2022-03-08 MED ORDER — 0.9 % SODIUM CHLORIDE (POUR BTL) OPTIME
TOPICAL | Status: DC | PRN
  Administered 2022-03-08: 1000 mL

## 2022-03-08 MED ORDER — HEPARIN SODIUM (PORCINE) 1000 UNIT/ML IJ SOLN
INTRAMUSCULAR | Status: DC | PRN
  Administered 2022-03-08: 3000 [IU] via INTRAVENOUS

## 2022-03-08 MED ORDER — HYDROMORPHONE HCL 1 MG/ML IJ SOLN: 0.2500 mg | INTRAMUSCULAR | Status: DC | PRN

## 2022-03-08 MED ORDER — HEPARIN 6000 UNIT IRRIGATION SOLUTION
Status: AC
  Filled 2022-03-08: qty 500

## 2022-03-08 MED ORDER — ALBUTEROL SULFATE HFA 108 (90 BASE) MCG/ACT IN AERS
INHALATION_SPRAY | RESPIRATORY_TRACT | Status: DC | PRN
  Administered 2022-03-08: 6 via RESPIRATORY_TRACT

## 2022-03-08 MED ORDER — ONDANSETRON HCL 4 MG/2ML IJ SOLN
INTRAMUSCULAR | Status: DC | PRN
  Administered 2022-03-08: 4 mg via INTRAVENOUS

## 2022-03-08 MED ORDER — ESMOLOL HCL 100 MG/10ML IV SOLN
INTRAVENOUS | Status: DC | PRN
  Administered 2022-03-08: 50 mg via INTRAVENOUS
  Administered 2022-03-08: 30 mg via INTRAVENOUS

## 2022-03-08 MED ORDER — LIDOCAINE-EPINEPHRINE (PF) 1 %-1:200000 IJ SOLN
INTRAMUSCULAR | Status: AC
  Filled 2022-03-08: qty 30

## 2022-03-08 MED ORDER — HEPARIN 6000 UNIT IRRIGATION SOLUTION
Status: DC | PRN
  Administered 2022-03-08: 1

## 2022-03-08 MED ORDER — CHLORHEXIDINE GLUCONATE 4 % EX LIQD: 60.0000 mL | Freq: Once | CUTANEOUS | Status: DC

## 2022-03-08 MED ORDER — OXYCODONE HCL 5 MG PO TABS: 5.0000 mg | ORAL_TABLET | Freq: Four times a day (QID) | ORAL | 0 refills | Status: DC | PRN

## 2022-03-08 MED ORDER — MIDAZOLAM HCL 2 MG/2ML IJ SOLN
INTRAMUSCULAR | Status: AC
  Filled 2022-03-08: qty 2

## 2022-03-08 MED ORDER — PROPOFOL 10 MG/ML IV BOLUS
INTRAVENOUS | Status: AC
  Filled 2022-03-08: qty 20

## 2022-03-08 MED ORDER — MUPIROCIN 2 % EX OINT
TOPICAL_OINTMENT | CUTANEOUS | Status: AC
  Filled 2022-03-08: qty 22

## 2022-03-08 MED ORDER — SUCCINYLCHOLINE CHLORIDE 200 MG/10ML IV SOSY
PREFILLED_SYRINGE | INTRAVENOUS | Status: DC | PRN
  Administered 2022-03-08: 100 mg via INTRAVENOUS

## 2022-03-08 MED ORDER — PHENYLEPHRINE HCL-NACL 20-0.9 MG/250ML-% IV SOLN
INTRAVENOUS | Status: DC | PRN
  Administered 2022-03-08: 30 ug/min via INTRAVENOUS

## 2022-03-08 MED ORDER — OXYCODONE HCL 5 MG PO TABS: 5.0000 mg | ORAL_TABLET | Freq: Once | ORAL | Status: DC | PRN

## 2022-03-08 SURGICAL SUPPLY — 36 items
ADH SKN CLS APL DERMABOND .7 (GAUZE/BANDAGES/DRESSINGS) ×1
APL PRP STRL LF DISP 70% ISPRP (MISCELLANEOUS) ×1
ARMBAND PINK RESTRICT EXTREMIT (MISCELLANEOUS) ×2 IMPLANT
BAG COUNTER SPONGE SURGICOUNT (BAG) ×2 IMPLANT
BAG SPNG CNTER NS LX DISP (BAG) ×1
CANISTER SUCT 3000ML PPV (MISCELLANEOUS) ×2 IMPLANT
CHLORAPREP W/TINT 26 (MISCELLANEOUS) ×2 IMPLANT
CLIP LIGATING EXTRA MED SLVR (CLIP) ×2 IMPLANT
CLIP LIGATING EXTRA SM BLUE (MISCELLANEOUS) ×2 IMPLANT
COVER PROBE W GEL 5X96 (DRAPES) ×2 IMPLANT
DERMABOND ADVANCED (GAUZE/BANDAGES/DRESSINGS) ×1
DERMABOND ADVANCED .7 DNX12 (GAUZE/BANDAGES/DRESSINGS) ×1 IMPLANT
ELECT REM PT RETURN 9FT ADLT (ELECTROSURGICAL) ×2
ELECTRODE REM PT RTRN 9FT ADLT (ELECTROSURGICAL) ×1 IMPLANT
GLOVE SURG ENC MOIS LTX SZ7.5 (GLOVE) ×2 IMPLANT
GOWN STRL REUS W/ TWL LRG LVL3 (GOWN DISPOSABLE) ×2 IMPLANT
GOWN STRL REUS W/ TWL XL LVL3 (GOWN DISPOSABLE) ×1 IMPLANT
GOWN STRL REUS W/TWL LRG LVL3 (GOWN DISPOSABLE) ×4
GOWN STRL REUS W/TWL XL LVL3 (GOWN DISPOSABLE) ×2
KIT BASIN OR (CUSTOM PROCEDURE TRAY) ×2 IMPLANT
KIT TURNOVER KIT B (KITS) ×2 IMPLANT
NS IRRIG 1000ML POUR BTL (IV SOLUTION) ×2 IMPLANT
PACK CV ACCESS (CUSTOM PROCEDURE TRAY) ×2 IMPLANT
PAD ARMBOARD 7.5X6 YLW CONV (MISCELLANEOUS) ×4 IMPLANT
SLING ARM FOAM STRAP LRG (SOFTGOODS) IMPLANT
SLING ARM FOAM STRAP MED (SOFTGOODS) IMPLANT
SUT MNCRL AB 4-0 PS2 18 (SUTURE) ×3 IMPLANT
SUT PROLENE 6 0 BV (SUTURE) ×4 IMPLANT
SUT SILK 2 0 SH (SUTURE) ×1 IMPLANT
SUT SILK 3 0 (SUTURE) ×2
SUT SILK 3-0 18XBRD TIE 12 (SUTURE) IMPLANT
SUT VIC AB 3-0 SH 27 (SUTURE) ×4
SUT VIC AB 3-0 SH 27X BRD (SUTURE) ×1 IMPLANT
TOWEL GREEN STERILE (TOWEL DISPOSABLE) ×2 IMPLANT
UNDERPAD 30X36 HEAVY ABSORB (UNDERPADS AND DIAPERS) ×2 IMPLANT
WATER STERILE IRR 1000ML POUR (IV SOLUTION) ×2 IMPLANT

## 2022-03-08 NOTE — Anesthesia Procedure Notes (Signed)
Procedure Name: Intubation ?Date/Time: 03/08/2022 8:21 AM ?Performed by: Janace Litten, CRNA ?Oxygen Delivery Method: Circle System Utilized ?Induction Type: Combination inhalational/ intravenous induction ?Laryngoscope Size: Mac and 4 ?Grade View: Grade I ?Tube type: Oral ?Tube size: 7.0 mm ?Number of attempts: 1 ?Airway Equipment and Method: Stylet and Oral airway ?Placement Confirmation: ETT inserted through vocal cords under direct vision, positive ETCO2 and breath sounds checked- equal and bilateral ?Secured at: 19 cm ?Tube secured with: Tape ?Dental Injury: Teeth and Oropharynx as per pre-operative assessment  ?Comments: Difficulty ventilating via LMA, conversion to ETT ? ? ? ? ?

## 2022-03-08 NOTE — Discharge Instructions (Addendum)
? ?  [facility]  ? ?Discharge Instructions ? ?AV Fistula or Graft Surgery for Dialysis Access ? ?Please refer to the following instructions for your post-procedure care. Your surgeon or physician assistant will discuss any changes with you. ? ?Activity ? ?You Diamond drive the day following your surgery, if you are comfortable and no longer taking prescription pain medication. Resume full activity as the soreness in your incision resolves. ? ?Bathing/Showering ? ?You Diamond shower after you go home. Keep your incision dry for 48 hours. Do not soak in a bathtub, hot tub, or swim until the incision heals completely. You Diamond not shower if you have a hemodialysis catheter. ? ?Incision Care ? ?Clean your incision with mild soap and water after 48 hours. Pat the area dry with a clean towel. You do not need a bandage unless otherwise instructed. Do not apply any ointments or creams to your incision. You Diamond have skin glue on your incision. Do not peel it off. It will come off on its own in about one week. Your arm Diamond swell a bit after surgery. To reduce swelling use pillows to elevate your arm so it is above your heart. Your doctor will tell you if you need to lightly wrap your arm with an ACE bandage. ? ?Diet ? ?Resume your normal diet. There are not special food restrictions following this procedure. In order to heal from your surgery, it is CRITICAL to get adequate nutrition. Your body requires vitamins, minerals, and protein. Vegetables are the best source of vitamins and minerals. Vegetables also provide the perfect balance of protein. Processed food has little nutritional value, so try to avoid this. ? ?Medications ? ?Resume taking all of your medications. If your incision is causing pain, you Diamond take over-the counter pain relievers such as acetaminophen (Tylenol). If you were prescribed a stronger pain medication, please be aware these medications can cause nausea and constipation. Prevent  nausea by taking the medication with a snack or meal. Avoid constipation by drinking plenty of fluids and eating foods with high amount of fiber, such as fruits, vegetables, and grains.  ?Do not take Tylenol if you are taking prescription pain medications. ? ?Restart taking your Eliquis on 03/10/2022 ? ?Follow up ?Your surgeon Diamond want to see you in the office following your access surgery. If so, this will be arranged at the time of your surgery. ? ?Please call us immediately for any of the following conditions: ? ?Increased pain, redness, drainage (pus) from your incision site ?Fever of 101 degrees or higher ?Severe or worsening pain at your incision site ?Hand pain or numbness. ? ?Reduce your risk of vascular disease: ? ?Stop smoking. If you would like help, call [facility] at 1-800-QUIT-NOW (938) 107-2028) or [facility] at [phone] ? ?Manage your cholesterol ?Maintain a desired weight ?Control your diabetes ?Keep your blood pressure down ? ?Dialysis ? ?It will take several weeks to several months for your new dialysis access to be ready for use. Your surgeon will determine when it is okay to use it. Your nephrologist will continue to direct your dialysis. You can continue to use your Permcath until your new access is ready for use. ? ? ?03/08/2022 ?Diamond [name] ?[id number] ?Diamond [name], Diamond [name] ? ?Surgeon(s): ?[name], MD ? ?Procedure(s): ?LEFT SECOND STAGE BASILIC VEIN TRANSPOSITION ? ?x Do not stick fistula for 4 weeks  ? ? ?If you have any questions, please call the office at [phone]. ? ?

## 2022-03-08 NOTE — Anesthesia Procedure Notes (Signed)
Procedure Name: LMA Insertion ?Date/Time: 03/08/2022 7:46 AM ?Performed by: Janace Litten, CRNA ?Pre-anesthesia Checklist: Patient identified, Emergency Drugs available, Suction available and Patient being monitored ?Patient Re-evaluated:Patient Re-evaluated prior to induction ?Oxygen Delivery Method: Circle System Utilized ?Preoxygenation: Pre-oxygenation with 100% oxygen ?Induction Type: IV induction ?Ventilation: Mask ventilation without difficulty ?LMA: LMA inserted ?LMA Size: 4.0 ?Number of attempts: 3 (attempt x2 LMA3, attempt x1 LMA 4) ?Airway Equipment and Method: Bite block ?Placement Confirmation: positive ETCO2 ?Tube secured with: Tape ?Dental Injury: Teeth and Oropharynx as per pre-operative assessment  ? ? ? ? ?

## 2022-03-08 NOTE — Anesthesia Postprocedure Evaluation (Signed)
Anesthesia Post Note ? ?Patient: Diamond [name] ? ?Procedure(s) Performed: LEFT SECOND STAGE BASILIC VEIN TRANSPOSITION (Left: Arm Upper) ? ?  ? ?Patient location during evaluation: PACU ?Anesthesia Type: General ?Level of consciousness: awake and alert ?Pain management: pain level controlled ?Vital Signs Assessment: post-procedure vital signs reviewed and stable ?Respiratory status: spontaneous breathing, nonlabored ventilation and respiratory function stable ?Cardiovascular status: blood pressure returned to baseline and stable ?Postop Assessment: no apparent nausea or vomiting ?Anesthetic complications: no ? ? ?No notable events documented. ? ?Last Vitals:  ?Vitals:  ? 03/08/22 1017 03/08/22 1032  ?BP: (!) 159/97 (!) 151/100  ?Pulse: 72 73  ?Resp: 19 15  ?Temp:  36.6 ?C  ?SpO2: 94% 94%  ?  ?Last Pain:  ?Vitals:  ? 03/08/22 1032  ?TempSrc:   ?PainSc: 0-No pain  ? ? ?  ?  ?  ?  ?  ?  ? ?Lynda Rainwater ? ? ? ? ?

## 2022-03-08 NOTE — Op Note (Signed)
DATE OF SERVICE: 03/08/2022 ? ?PATIENT:  Diamond [name]  71 y.o. female ? ?PRE-OPERATIVE DIAGNOSIS:  ESRD ? ?POST-OPERATIVE DIAGNOSIS:  Same ? ?PROCEDURE:   ?left second stage basilic vein transposition ? ?SURGEON:  Surgeon(s) and Role: ?   * [name], MD - Primary ? ?ASSISTANT: Leontine Locket, PA-C ? ?An experienced assistant was required given the complexity of this procedure and the standard of surgical care. My assistant helped with exposure through counter tension, suctioning, ligation and retraction to better visualize the surgical field.  My assistant expedited sewing during the case by following my sutures. Wherever I use the term "we" in the report, my assistant actively helped me with that portion of the procedure. ? ?ANESTHESIA:   general ? ?EBL: minimal ? ?BLOOD ADMINISTERED:none ? ?DRAINS: none  ? ?LOCAL MEDICATIONS USED:  NONE ? ?SPECIMEN:  none ? ?COUNTS: confirmed correct. ? ?TOURNIQUET:  none ? ?PATIENT DISPOSITION:  PACU - hemodynamically stable. ?  ?Delay start of Pharmacological VTE agent (>24hrs) due to surgical blood loss or risk of bleeding: no ? ?INDICATION FOR PROCEDURE: Diamond [name] is a 71 y.o. female with ESRD in need of permanent dialysis access. I created a first stage basilic vein fistula for her on 12/23/21. After careful discussion of risks, benefits, and alternatives the patient was offered second stage transposition. The patient understood and wished to proceed. ? ?OPERATIVE FINDINGS: healthy basilic vein fistula. Unremarkable transposition. ? ?DESCRIPTION OF PROCEDURE: After identification of the patient in the pre-operative holding area, the patient was transferred to the operating room. The patient was positioned supine on the operating room table. Anesthesia was induced. The left arm was prepped and draped in standard fashion. A surgical pause was performed confirming correct patient, procedure, and operative location. ? ?Using intraoperative ultrasound the course of the  left basilic vein was marked on the skin. Four skip incisions were made over the course of the basilic vein fistula. The antecubital incision was reopened. These were carried down through subcutaneous tissue until the fistula was encountered.  The fistula was skeletonized from the axilla to the anastomosis, taking care to ligate and divide sidebranches, and to protect the medial antebrachial cutaneous nerve.  ? ?A subcutaneous tunnel was created over the biceps using a sheath tunneling device.  Patient was heparinized.  The fistula near the anastomosis was clamped.  The outflow in the axilla was clamped.  The fistula was divided.  The fistula was marked to ensure no twisting or kinking while delivering the fistula through the tunnel.  The fistula was tunneled through the arm and delivered near the previous anastomosis. ? ?The fistula was spatulated proximally and distally.  The fistula was reanastomosed end-to-end using continuous running suture of 5-0 Prolene.  A palpable thrill was felt over the subcutaneous course of the tunnel.  Doppler flow was excellent in the proximal and distal fistula. ? ?The wounds were copiously irrigated.  Hemostasis was ensured in the surgical bed.  The wounds were closed in layers using 3-0 Vicryl and 4-0 Monocryl. ? ?Upon completion of the case instrument and sharps counts were confirmed correct. The patient was transferred to the PACU in good condition. I was present for all portions of the procedure. ? ?Yevonne Aline. Stanford Breed, MD ?[facility] [place] ?Office Phone Number: [phone] ?03/08/2022 10:18 AM ? ? ? ?

## 2022-03-08 NOTE — Interval H&P Note (Signed)
History and Physical Interval Note: ? ?03/08/2022 ?7:22 AM ? ?Diamond [name]  has presented today for surgery, with the diagnosis of ESRD.  The various methods of treatment have been discussed with the patient and family. After consideration of risks, benefits and other options for treatment, the patient has consented to  Procedure(s): ?LEFT SECOND STAGE BASILIC VEIN TRANSPOSITION (Left) as a surgical intervention.  The patient's history has been reviewed, patient examined, no change in status, stable for surgery.  I have reviewed the patient's chart and labs.  Questions were answered to the patient's satisfaction.   ? ? ?[name] ? ? ?

## 2022-03-08 NOTE — Transfer of Care (Signed)
Immediate Anesthesia Transfer of Care Note ? ?Patient: Diamond [name] ? ?Procedure(s) Performed: LEFT SECOND STAGE BASILIC VEIN TRANSPOSITION (Left: Arm Upper) ? ?Patient Location: PACU ? ?Anesthesia Type:General ? ?Level of Consciousness: drowsy, patient cooperative and responds to stimulation ? ?Airway & Oxygen Therapy: Patient Spontanous Breathing ? ?Post-op Assessment: Report given to RN and Post -op Vital signs reviewed and stable ? ?Post vital signs: Reviewed and stable ? ?Last Vitals:  ?Vitals Value Taken Time  ?BP 176/104 03/08/22 1002  ?Temp    ?Pulse 74 03/08/22 1003  ?Resp 16 03/08/22 1003  ?SpO2 93 % 03/08/22 1003  ?Vitals shown include unvalidated device data. ? ?Last Pain:  ?Vitals:  ? 03/08/22 0649  ?TempSrc:   ?PainSc: 0-No pain  ?   ? ?Patients Stated Pain Goal: 2 (03/05/22 1303) ? ?Complications: No notable events documented. ?

## 2022-03-08 NOTE — Anesthesia Preprocedure Evaluation (Addendum)
Anesthesia Evaluation  ?Patient identified by MRN, date of birth, ID band ?Patient awake ? ? ? ?Reviewed: ?Allergy & Precautions, NPO status , Patient's Chart, lab work & pertinent test results ? ?Airway ?Mallampati: II ? ?TM Distance: >3 FB ?Neck ROM: Full ? ? ? Dental ? ?(+) Dental Advisory Given, Missing ?  ?Pulmonary ?asthma ,  ?  ?Pulmonary exam normal ?breath sounds clear to auscultation ? ? ? ? ? ? Cardiovascular ?hypertension, Pt. on medications ?+ dysrhythmias Atrial Fibrillation  ?Rhythm:Regular Rate:Tachycardia ? ? ?  ?Neuro/Psych ?negative neurological ROS ? negative psych ROS  ? GI/Hepatic ?negative GI ROS, Neg liver ROS,   ?Endo/Other  ?negative endocrine ROS ? Renal/GU ?ESRFRenal disease (TTHSAT)  ? ?  ?Musculoskeletal ? ?(+) Arthritis , Osteoarthritis,   ? Abdominal ?  ?Peds ? Hematology ? ?(+) Blood dyscrasia, anemia ,   ?Anesthesia Other Findings ? ? Reproductive/Obstetrics ? ?  ? ? ? ? ? ? ? ? ? ? ? ? ? ?  ?  ? ? ? ? ? ? ? ? ?Anesthesia Physical ? ?Anesthesia Plan ? ?ASA: 3 ? ?Anesthesia Plan: General  ? ?Post-op Pain Management: Dilaudid IV and Minimal or no pain anticipated  ? ?Induction: Intravenous ? ?PONV Risk Score and Plan: 3 and Treatment may vary due to age or medical condition, Ondansetron, Dexamethasone and Midazolam ? ?Airway Management Planned: LMA ? ?Additional Equipment:  ? ?Intra-op Plan:  ? ?Post-operative Plan: Extubation in OR ? ?Informed Consent: I have reviewed the patients History and Physical, chart, labs and discussed the procedure including the risks, benefits and alternatives for the proposed anesthesia with the patient or authorized representative who has indicated his/her understanding and acceptance.  ? ? ? ?Dental advisory given ? ?Plan Discussed with: CRNA ? ?Anesthesia Plan Comments:   ? ? ? ? ? ?Anesthesia Quick Evaluation ? ?

## 2022-03-09 ENCOUNTER — Telehealth: Payer: Self-pay

## 2022-03-09 ENCOUNTER — Emergency Department (HOSPITAL_COMMUNITY)
Admission: EM | Admit: 2022-03-09 | Discharge: 2022-03-09 | Disposition: A | Discharge status: Home / Self Care | Payer: Medicare HMO | Attending: Emergency Medicine | Admitting: Emergency Medicine

## 2022-03-09 ENCOUNTER — Encounter (HOSPITAL_COMMUNITY): Payer: Self-pay | Admitting: Vascular Surgery

## 2022-03-09 ENCOUNTER — Emergency Department (HOSPITAL_COMMUNITY): Payer: Medicare HMO

## 2022-03-09 DIAGNOSIS — D649 Anemia, unspecified: Secondary | ICD-10-CM | POA: Insufficient documentation

## 2022-03-09 DIAGNOSIS — J189 Pneumonia, unspecified organism: Secondary | ICD-10-CM

## 2022-03-09 DIAGNOSIS — T7840XA Allergy, unspecified, initial encounter: Secondary | ICD-10-CM | POA: Diagnosis not present

## 2022-03-09 DIAGNOSIS — R600 Localized edema: Secondary | ICD-10-CM | POA: Diagnosis not present

## 2022-03-09 DIAGNOSIS — Z79899 Other long term (current) drug therapy: Secondary | ICD-10-CM | POA: Diagnosis not present

## 2022-03-09 DIAGNOSIS — J181 Lobar pneumonia, unspecified organism: Secondary | ICD-10-CM | POA: Diagnosis not present

## 2022-03-09 DIAGNOSIS — J168 Pneumonia due to other specified infectious organisms: Secondary | ICD-10-CM | POA: Diagnosis not present

## 2022-03-09 DIAGNOSIS — R0602 Shortness of breath: Secondary | ICD-10-CM | POA: Diagnosis not present

## 2022-03-09 DIAGNOSIS — Z760 Encounter for issue of repeat prescription: Secondary | ICD-10-CM | POA: Diagnosis not present

## 2022-03-09 DIAGNOSIS — J45909 Unspecified asthma, uncomplicated: Secondary | ICD-10-CM | POA: Diagnosis not present

## 2022-03-09 DIAGNOSIS — R059 Cough, unspecified: Secondary | ICD-10-CM | POA: Diagnosis not present

## 2022-03-09 DIAGNOSIS — I517 Cardiomegaly: Secondary | ICD-10-CM | POA: Diagnosis not present

## 2022-03-09 DIAGNOSIS — M79602 Pain in left arm: Secondary | ICD-10-CM | POA: Diagnosis present

## 2022-03-09 LAB — COMPREHENSIVE METABOLIC PANEL
ALT: 7 U/L (ref 0–44)
AST: 23 U/L (ref 15–41)
Albumin: 2.7 g/dL — ABNORMAL LOW (ref 3.5–5.0)
Alkaline Phosphatase: 46 U/L (ref 38–126)
Anion gap: 17 — ABNORMAL HIGH (ref 5–15)
BUN: 34 mg/dL — ABNORMAL HIGH (ref 8–23)
CO2: 23 mmol/L (ref 22–32)
Calcium: 9.3 mg/dL (ref 8.9–10.3)
Chloride: 97 mmol/L — ABNORMAL LOW (ref 98–111)
Creatinine, Ser: 7.61 mg/dL — ABNORMAL HIGH (ref 0.44–1.00)
GFR, Estimated: 5 mL/min — ABNORMAL LOW (ref >60)
Glucose, Bld: 92 mg/dL (ref 70–99)
Potassium: 3.9 mmol/L (ref 3.5–5.1)
Sodium: 137 mmol/L (ref 135–145)
Total Bilirubin: 0.6 mg/dL (ref 0.3–1.2)
Total Protein: 6.5 g/dL (ref 6.5–8.1)

## 2022-03-09 LAB — CBC WITH DIFFERENTIAL/PLATELET
Abs Immature Granulocytes: 0.05 10*3/uL (ref 0.00–0.07)
Basophils Absolute: 0 10*3/uL (ref 0.0–0.1)
Basophils Relative: 0 %
Eosinophils Absolute: 0 10*3/uL (ref 0.0–0.5)
Eosinophils Relative: 0 %
HCT: 24.5 % — ABNORMAL LOW (ref 36.0–46.0)
Hemoglobin: 7.7 g/dL — ABNORMAL LOW (ref 12.0–15.0)
Immature Granulocytes: 1 %
Lymphocytes Relative: 30 %
Lymphs Abs: 1.4 10*3/uL (ref 0.7–4.0)
MCH: 31 pg (ref 26.0–34.0)
MCHC: 31.4 g/dL (ref 30.0–36.0)
MCV: 98.8 fL (ref 80.0–100.0)
Monocytes Absolute: 0.3 10*3/uL (ref 0.1–1.0)
Monocytes Relative: 7 %
Neutro Abs: 3 10*3/uL (ref 1.7–7.7)
Neutrophils Relative %: 62 %
Platelets: 157 10*3/uL (ref 150–400)
RBC: 2.48 MIL/uL — ABNORMAL LOW (ref 3.87–5.11)
RDW: 18.6 % — ABNORMAL HIGH (ref 11.5–15.5)
WBC: 4.7 10*3/uL (ref 4.0–10.5)
nRBC: 0 % (ref 0.0–0.2)

## 2022-03-09 LAB — LACTIC ACID, PLASMA: Lactic Acid, Venous: 3.8 mmol/L (ref 0.5–1.9)

## 2022-03-09 MED ORDER — DOXYCYCLINE HYCLATE 100 MG PO TABS
100.0000 mg | ORAL_TABLET | Freq: Once | ORAL | Status: AC
Start: 2022-03-09 — End: 2022-03-09
  Administered 2022-03-09: 100 mg via ORAL
  Filled 2022-03-09: qty 1

## 2022-03-09 MED ORDER — DIPHENHYDRAMINE HCL 25 MG PO CAPS
25.0000 mg | ORAL_CAPSULE | Freq: Once | ORAL | Status: AC
  Administered 2022-03-09: 25 mg via ORAL
  Filled 2022-03-09: qty 1

## 2022-03-09 MED ORDER — DOXYCYCLINE HYCLATE 100 MG PO CAPS: 100.0000 mg | ORAL_CAPSULE | Freq: Two times a day (BID) | ORAL | 0 refills | Status: AC

## 2022-03-09 MED ORDER — METHYLPREDNISOLONE SODIUM SUCC 40 MG IJ SOLR
40.0000 mg | Freq: Once | INTRAMUSCULAR | Status: AC
  Administered 2022-03-09: 40 mg via INTRAMUSCULAR
  Filled 2022-03-09: qty 1

## 2022-03-09 NOTE — Telephone Encounter (Signed)
Patient's daughter call into office this morning stating her mother Diamond [name]) has developed facial and lip swelling associated with sore throat. She states that she felt like her mom was having an allergic reaction to the Anesthesia from the procedure. I advised [name] to proceed immediately to nearest ER due to the facial and lip swelling. [name] voiced her understanding. ?

## 2022-03-09 NOTE — ED Triage Notes (Signed)
Patient here with complaint of tenderness and pain in left arm around where she states she had a new dialysis fistula placed. Patient is afebrile, alert, oriented, and in no apparent distress at this time. ?

## 2022-03-09 NOTE — Discharge Instructions (Addendum)
Instructions - Important - Review Carefully ? ?You were seen in the ER for a likely allergic reaction to one of the anesthesia medications yesterday.  It is not clear which medication would have caused this.  We gave you a shot of steroids and a dose of Benadryl, which should help some with the swelling, but generally this will just take a few days for the swelling to resolve.  If at any point you begin having difficulty breathing, throat swelling, or sudden lightheadedness, please call 911 and come back to the ER. ? ?Your x-ray also showed that you may have a pneumonia on the left lower lung.  I started you on an antibiotic called doxycycline which you will take twice a day for a week.  If your coughing gets worse, or you begin having fevers, please come back to the ER.  Sometimes, patients need to be admitted for IV antibiotics for pneumonia. ? ?Finally, we talked about your anemia.  Your blood count and hemoglobin is 7.7, which is little lower than normal.  This may be normal after blood loss from surgery such as yesterday.  However it is very important that one of your doctors rechecks her blood counts in the next 1 to 3 days if possible.  We want to make sure that your hemoglobin level is stable, and not dropping lower.  You can ask your dialysis center as they can recheck today or Thursday. ? ?Your left arm has some pain and swelling, which you can apply ice to as needed.  This is normally expected after an operation. ? ?* ? ?Please drink a little more water for the next few days than you normally do. You may be mildly dehydrated from your blood tests today. ?

## 2022-03-09 NOTE — ED Provider Notes (Signed)
?[facility] ?Provider Note ? ? ?CSN: [id number] ?Arrival date & time: 03/09/22  1020 ? ?  ? ?History ? ?Chief Complaint  ?Patient presents with  ? Arm Pain  ? ? ?Diamond [name] is a 71 y.o. female presenting to the ED with multiple concerns in the company of her son.  The patient had a new left arm dialysis fistula placed yesterday under anesthesia.  She had been having dialysis for several months and right arm prior to that.  She reports that since waking up from anesthesia she noted that her lips were swollen, she was sent home, but her family was concerned about her lip swelling.  She does not have any known drug allergies but is allergic to shellfish.  She denies throat swelling or difficulty breathing or hives.  She reports soreness in her left arm at the surgical site.  Her family is also concerned she had a productive cough for 2 days, preceding her surgery. ? ?She was scheduled for dialysis today but has not gone yet.  She has not missed any prior sessions.  Her neck scheduled on Tuesday ? ?HPI ? ?  ? ?Home Medications ?Prior to Admission medications   ?Medication Sig Start Date End Date Taking? Authorizing Provider  ?doxycycline (VIBRAMYCIN) 100 MG capsule Take 1 capsule (100 mg total) by mouth 2 (two) times daily for 7 days. 03/09/22 03/16/22 Yes Britnee Mcdevitt, Carola Rhine, MD  ?acetaminophen (TYLENOL) 500 MG tablet Take 1,000 mg by mouth every 6 (six) hours as needed for moderate pain.    Raymondo Band, MD  ?albuterol (VENTOLIN HFA) 108 (90 Base) MCG/ACT inhaler Inhale 2 puffs into the lungs every 4 (four) hours as needed for wheezing or shortness of breath. 10/08/19   [provider]  ?allopurinol (ZYLOPRIM) 100 MG tablet Take 100 mg by mouth 2 (two) times daily. 12/03/21   [provider]  ?apixaban (ELIQUIS) 2.5 MG TABS tablet Take 1 tablet (2.5 mg total) by mouth 2 (two) times daily. 02/06/22   Ghimire, Henreitta Leber, MD  ?cyclobenzaprine (FLEXERIL) 5 MG tablet  Take 2.5 mg by mouth daily as needed for muscle spasms.    [provider]  ?fluticasone-salmeterol (ADVAIR) 250-50 MCG/ACT AEPB Inhale 1 puff into the lungs 2 (two) times daily. 02/19/22   [provider]  ?hydrALAZINE (APRESOLINE) 50 MG tablet Take 1 tablet (50 mg total) by mouth every 8 (eight) hours. ?Patient taking differently: Take 50 mg by mouth daily. 12/30/21   Ghimire, Henreitta Leber, MD  ?Lidocaine 4 % PTCH Apply 1 patch topically daily as needed (pain).    Raymondo Band, MD  ?metoprolol tartrate (LOPRESSOR) 50 MG tablet Take 1 tablet (50 mg total) by mouth 2 (two) times daily. 12/30/21   Ghimire, Henreitta Leber, MD  ?oxyCODONE (ROXICODONE) 5 MG immediate release tablet Take 1 tablet (5 mg total) by mouth every 6 (six) hours as needed for severe pain. 03/08/22   Rhyne, Hulen Shouts, PA-C  ?pantoprazole (PROTONIX) 40 MG tablet Take 1 tablet (40 mg total) by mouth 2 (two) times daily. 01/22/22   Thurnell Lose, MD  ?sevelamer carbonate (RENVELA) 800 MG tablet Take 2 tablets (1,600 mg total) by mouth 3 (three) times daily with meals. ?Patient not taking: Reported on 03/04/2022 12/30/21   Jonetta Osgood, MD  ?   ? ?Allergies    ?Shellfish allergy   ? ?Review of Systems   ?Review of Systems ? ?Physical Exam ?Updated Vital Signs ?BP Marland Kitchen)  151/89 (BP Location: Right Arm)   Pulse 69   Temp 97.8 ?F (36.6 ?C) (Oral)   Resp 18   SpO2 100%  ?Physical Exam ?Constitutional:   ?   General: She is not in acute distress. ?HENT:  ?   Head: Normocephalic and atraumatic.  ?   Comments: Bilateral swelling of the upper and lower lip, symmetrical ?Oropharynx non-erythematous.  ?No tonsillar swelling or exudate.  ?No uvular deviation.  ?No drooling. ?No brawny edema. ?No stridor. ?Voice is not muffled. ? ? ?Eyes:  ?   Conjunctiva/sclera: Conjunctivae normal.  ?   Pupils: Pupils are equal, round, and reactive to light.  ?Cardiovascular:  ?   Rate and Rhythm: Normal rate and regular rhythm.  ?Pulmonary:  ?   Effort:  Pulmonary effort is normal. No respiratory distress.  ?   Comments: Coarse productive cough, rhonchi left lower lobe ?Abdominal:  ?   General: There is no distension.  ?   Tenderness: There is no abdominal tenderness.  ?Skin: ?   General: Skin is warm and dry.  ?   Comments: Edema and ecchymosis of the left upper arm near surgical site  ?Neurological:  ?   General: No focal deficit present.  ?   Mental Status: She is alert. Mental status is at baseline.  ?Psychiatric:     ?   Mood and Affect: Mood normal.     ?   Behavior: Behavior normal.  ? ? ?ED Results / Procedures / Treatments   ?Labs ?(all labs ordered are listed, but only abnormal results are displayed) ?Labs Reviewed  ?LACTIC ACID, PLASMA - Abnormal; Notable for the following components:  ?    Result Value  ? Lactic Acid, Venous 3.8 (*)   ? All other components within normal limits  ?COMPREHENSIVE METABOLIC PANEL - Abnormal; Notable for the following components:  ? Chloride 97 (*)   ? BUN 34 (*)   ? Creatinine, Ser 7.61 (*)   ? Albumin 2.7 (*)   ? GFR, Estimated 5 (*)   ? Anion gap 17 (*)   ? All other components within normal limits  ?CBC WITH DIFFERENTIAL/PLATELET - Abnormal; Notable for the following components:  ? RBC 2.48 (*)   ? Hemoglobin 7.7 (*)   ? HCT 24.5 (*)   ? RDW 18.6 (*)   ? All other components within normal limits  ? ? ?EKG ?None ? ?Radiology ?DG Chest 2 View ? ?Result Date: 03/09/2022 ?CLINICAL DATA:  Cough and shortness of breath for 2 days.  Asthma. EXAM: CHEST - 2 VIEW COMPARISON:  02/05/2022 FINDINGS: Mild cardiomegaly remains stable. Right jugular dual-lumen central venous catheter remains in appropriate position. Pulmonary hyperinflation again seen, consistent with COPD. New infiltrate is seen in the left lower lobe, suspicious for pneumonia. Right lung remains clear. No evidence of pleural effusion. IMPRESSION: New left lower lobe infiltrate, suspicious for pneumonia. Recommend continued chest radiographic follow-up to confirm  resolution. COPD. Electronically Signed   By: Marlaine Hind M.D.   On: 03/09/2022 11:00   ? ?Procedures ?Procedures  ? ? ?Medications Ordered in ED ?Medications  ?doxycycline (VIBRA-TABS) tablet 100 mg (100 mg Oral Given 03/09/22 1220)  ?methylPREDNISolone sodium succinate (SOLU-MEDROL) 40 mg/mL injection 40 mg (40 mg Intramuscular Given 03/09/22 1221)  ?diphenhydrAMINE (BENADRYL) capsule 25 mg (25 mg Oral Given 03/09/22 1221)  ? ? ?ED Course/ Medical Decision Making/ A&P ?  ?                        ?  Medical Decision Making ?Amount and/or Complexity of Data Reviewed ?Labs: ordered. ?Radiology: ordered. ? ?Risk ?Prescription drug management. ? ? ?Patient is here with lip swelling, which was likely an allergic reaction from anesthesia given the timing of these events versus angioedema versus other.  She appears stable after 24 hours, no evidence of airway compromise.  We can give a shot of IM Solu-Medrol as well as some oral Benadryl, but I do not believe this is anaphylaxis and she does not need epinephrine at this time.  I anticipate the symptoms should improve over the next day or 2. ? ?She complains of left arm pain, does appear that there is some ecchymoses and swelling but I believe this to be expected after surgical procedure yesterday.  She has intact distal pulses without evidence of acute ischemia.  I do not see any gross evidence of infection or cellulitis of the arm. ? ?She also had a productive cough for 2 days, with chest x-ray today concerning for a left lower lobe infiltrate.  She is not hypoxic or febrile here.  I think is reasonable to treat for community pneumonia as this began prior to her operation yesterday.  I will place on doxycycline for 7 days, first dose here. ? ?There is no emergent indication for dialysis at this time.  She is not volume overloaded potassium within normal limits. ? ?Supplemental history is provided by her son at bedside. ? ?Patient is at high risk of medical comorbidities due to  her end-stage renal disease on dialysis.  ? ?Of note she does have some mild worsening anemia from her baseline anemia, hemoglobin is 7.7 today.  This is likely expected after surgical procedure.  However I explain

## 2022-03-11 DIAGNOSIS — N2581 Secondary hyperparathyroidism of renal origin: Secondary | ICD-10-CM | POA: Diagnosis not present

## 2022-03-11 DIAGNOSIS — N186 End stage renal disease: Secondary | ICD-10-CM | POA: Diagnosis not present

## 2022-03-11 DIAGNOSIS — Z992 Dependence on renal dialysis: Secondary | ICD-10-CM | POA: Diagnosis not present

## 2022-03-13 DIAGNOSIS — N186 End stage renal disease: Secondary | ICD-10-CM | POA: Diagnosis not present

## 2022-03-13 DIAGNOSIS — N2581 Secondary hyperparathyroidism of renal origin: Secondary | ICD-10-CM | POA: Diagnosis not present

## 2022-03-13 DIAGNOSIS — Z992 Dependence on renal dialysis: Secondary | ICD-10-CM | POA: Diagnosis not present

## 2022-03-13 NOTE — Progress Notes (Deleted)
?Cardiology Office Note:   ? ?Date:  03/13/2022  ? ?ID:  Diamond [name], DOB [birth year], MRN [id number] ? ?PCP:  [name], MD ?  ?[facility] Providers ?Cardiologist:  Freada Bergeron, MD { ? ? ?Referring MD: [name], MD  ? ? ?History of Present Illness:   ? ?Diamond [name] is a 71 y.o. female with a hx of  ESRD on HD, asthma, HTN, anemia of chronic disease, atrial flutter, and admission in 12/2021 for MRSA bacteremia and IE who presents to clinic for follow-up. ? ?Patient was admitted 12/16/21-12/30/21 due to MRSA bacteremia with hospital course complicated by atrial flutter and bleeding from HD catheter site. TEE 12/22/21 with right atrial/IVC globular mobile mass compatible with thrombotic/vegetative material. Received IV abx through 02/13/22. Incidental finding of lung nodule recommended for repeat CT in 3 mos.  ? ?Was last seen in clinic on 01/2022 by Laurann Montana, NP where she was doing well. No CV symptoms at that time.  ? ?Today, *** ? ?Past Medical History:  ?Diagnosis Date  ? Anemia   ? Asthma   ? Atrial flutter with rapid ventricular response ([facility]) 12/25/2021  ? Chronic kidney disease   ? dialysis Tues Thurs Sat  ? Dysrhythmia   ? PAF 10/2019 in setting of COVID-19 PNA  ? Gout   ? Hypertension   ? ? ?Past Surgical History:  ?Procedure Laterality Date  ? AV FISTULA PLACEMENT Left 12/23/2021  ? Procedure: LEFT ARM BRACHIOBASILIC VEIN ARTERIOVENOUS (AV) FISTULA CREATION;  Surgeon: [name], MD;  Location: [facility];  Service: Vascular;  Laterality: Left;  ? BASCILIC VEIN TRANSPOSITION Left 03/08/2022  ? Procedure: LEFT SECOND STAGE BASILIC VEIN TRANSPOSITION;  Surgeon: [name], MD;  Location: [facility];  Service: Vascular;  Laterality: Left;  ? BIOPSY  01/19/2022  ? Procedure: BIOPSY;  Surgeon: Lavena Bullion, DO;  Location: [facility];  Service: Gastroenterology;;  ? [place]  ? COLONOSCOPY Left 01/19/2022  ? Procedure: COLONOSCOPY;  Surgeon: Lavena Bullion, DO;  Location: [facility];  Service: Gastroenterology;  Laterality: Left;  ? ESOPHAGOGASTRODUODENOSCOPY Left 01/19/2022  ? Procedure: ESOPHAGOGASTRODUODENOSCOPY (EGD);  Surgeon: Lavena Bullion, DO;  Location: [facility] ENDOSCOPY;  Service: Gastroenterology;  Laterality: Left;  ? INSERTION OF DIALYSIS CATHETER Right 12/23/2021  ? Procedure: INSERTION OF DIALYSIS CATHETER;  Surgeon: [name], MD;  Location: [facility];  Service: Vascular;  Laterality: Right;  ? POLYPECTOMY  01/19/2022  ? Procedure: POLYPECTOMY;  Surgeon: Lavena Bullion, DO;  Location: [name];  Service: Gastroenterology;;  ? TEE WITHOUT CARDIOVERSION N/A 12/22/2021  ? Procedure: TRANSESOPHAGEAL ECHOCARDIOGRAM (TEE);  Surgeon: Jerline Pain, MD;  [facility] END[facility]COPY;  Service: Cardiovascular;  Laterality: N/A;  ? ULTRASOUND GUIDANCE FOR VASCULAR ACCESS  12/23/2021  ? Procedure: ULTRASOUND GUIDANCE FOR VASCULAR ACCESS;  Surgeon: [name], MD;  Location: [facility];  Service: Vascular;;  ? ? ?Current Medications: ?No outpatient medications have been marked as taking for the 03/15/22 encounter (Appointment) with Freada Bergeron, MD.  ?  ? ?Allergies:   Shellfish allergy  ? ?Social History  ? ?Socioeconomic History  ? Marital status: Widowed  ?  Spouse name: Not on file  ? Number of children: Not on file  ? Years of education: Not on file  ? Highest education level: Not on file  ?Occupational History  ? Not on file  ?Tobacco Use  ? Smoking status: Never  ? Smokeless tobacco: Never  ?Vaping Use  ?  Vaping Use: Never used  ?Substance and Sexual Activity  ? Alcohol use: No  ? Drug use: No  ? Sexual activity: Not Currently  ?  Birth control/protection: Post-menopausal  ?Other Topics Concern  ? Not on file  ?Social History Narrative  ? Not on file  ? ?Social Determinants of Health  ? ?Financial Resource Strain: Not on file  ?Food Insecurity: Not on file  ?Transportation Needs: Not on file  ?Physical Activity: Not on file  ?Stress: Not on file   ?Social Connections: Not on file  ?  ? ?Family History: ?The patient's ***family history includes Colon cancer in her brother; Hypertension in her father and mother; Lung cancer in her brother. ? ?ROS:   ?Please see the history of present illness.    ?*** All other systems reviewed and are negative. ? ?EKGs/Labs/Other Studies Reviewed:   ? ?The following studies were reviewed today: ?TTE 03/03/22: ?IMPRESSIONS  ? 1. Left ventricular ejection fraction, by estimation, is 60 to 65%. The  ?left ventricle has normal function. The left ventricle has no regional  ?wall motion abnormalities. Left ventricular diastolic parameters are  ?consistent with Grade I diastolic  ?dysfunction (impaired relaxation).  ? 2. Right ventricular systolic function is normal. The right ventricular  ?size is normal.  ? 3. Highly mobile mass in the right atrium the origin/attachment site is  ?not well visualized. This appears smaller in size than previously noted  ?right atrial mass.  ? 4. The mitral valve is normal in structure. Trivial mitral valve  ?regurgitation. No evidence of mitral stenosis.  ? 5. Tricuspid valve regurgitation is mild to moderate.  ? 6. Highly mobile 0.836 cm x 0.694 cm mass in the RVOT appears to be  ?attached to the annulus of the aortic valve above the right cusp, present  ?on previous study. The aortic valve is normal in structure. Aortic valve  ?regurgitation is not visualized.  ?Aortic valve sclerosis is present, with no evidence of aortic valve  ?stenosis.  ? 7. There is dilatation of the ascending aorta, measuring 39 mm.  ? 8. The inferior vena cava is normal in size with greater than 50%  ?respiratory variability, suggesting right atrial pressure of 3 mmHg.  ? ?Comparison(s): No significant change from prior study.  ? ?TEE: 12/22/2021 ? 1. There is a 2.1 x 1.37 mutilobulated mobile mass attached to the RA/IVC junction which has the appearance of thrombus/vegetation in the setting of bacteremia and recently  removed dialysis catheter.  ? 2. Left ventricular ejection fraction, by estimation, is 60 to 65%. The  ?left ventricle has normal function. The left ventricle has no regional  ?wall motion abnormalities.  ? 3. Right ventricular systolic function is normal. The right ventricular  ?size is normal.  ? 4. No left atrial/left atrial appendage thrombus was detected.  ? 5. No mitral valve vegetation. The mitral valve is degenerative. Trivial  ?mitral valve regurgitation. No evidence of mitral stenosis.  ? 6. The aortic valve is normal in structure. Aortic valve regurgitation is  ?not visualized. No aortic stenosis is present.  ? 7. The inferior vena cava is normal in size with greater than 50%  ?respiratory variability, suggesting right atrial pressure of 3 mmHg.  ?  ?Echo 12/18/21 ? 1. Left ventricular ejection fraction, by estimation, is 60 to 65%. The  ?left ventricle has normal function. The left ventricle has no regional  ?wall motion abnormalities. There is moderate hypertrophy of the basal  ?septum. The rest of the LV  segments  ?demonstrate mild left ventricular hypertrophy. Left ventricular diastolic  ?parameters are consistent with Grade I diastolic dysfunction (impaired  ?relaxation).  ? 2. Right ventricular systolic function is normal. The right ventricular  ?size is normal. There is normal pulmonary artery systolic pressure.  ? 3. The mitral valve leaflets are thickened. There are fibrinous densities  ?visualized on the anterior mitral valve leaflet on the apical 3C view.  ?Given underlying bacteremia, findings concerning for vegetation formation  ?(versus degenerative valve  ?changes). There is trivial mitral regurgitation and no evidence of valve  ?destruction. Recommend TEE for further evaluation.  ? 4. The aortic valve is tricuspid. There is mild calcification of the  ?aortic valve. There is moderate thickening of the aortic valve. Aortic  ?valve regurgitation is trivial. Aortic valve sclerosis/calcification  is  ?present, without any evidence of aortic  ?stenosis.  ? 5. Aortic dilatation noted. There is mild dilatation of the ascending  ?aorta, measuring 40 mm.  ? 6. There is a highly mobile, fibrinous struct

## 2022-03-15 ENCOUNTER — Ambulatory Visit: Payer: Medicare HMO | Admitting: Cardiology

## 2022-03-15 ENCOUNTER — Encounter: Payer: Self-pay | Admitting: Cardiology

## 2022-03-15 VITALS — BP 154/88 | HR 81 | Ht 67.0 in | Wt 137.0 lb

## 2022-03-15 DIAGNOSIS — Z87898 Personal history of other specified conditions: Secondary | ICD-10-CM

## 2022-03-15 DIAGNOSIS — I330 Acute and subacute infective endocarditis: Secondary | ICD-10-CM | POA: Diagnosis not present

## 2022-03-15 DIAGNOSIS — Z992 Dependence on renal dialysis: Secondary | ICD-10-CM | POA: Diagnosis not present

## 2022-03-15 DIAGNOSIS — I483 Typical atrial flutter: Secondary | ICD-10-CM

## 2022-03-15 DIAGNOSIS — N186 End stage renal disease: Secondary | ICD-10-CM

## 2022-03-15 DIAGNOSIS — I10 Essential (primary) hypertension: Secondary | ICD-10-CM

## 2022-03-15 DIAGNOSIS — J189 Pneumonia, unspecified organism: Secondary | ICD-10-CM | POA: Diagnosis not present

## 2022-03-15 DIAGNOSIS — I5189 Other ill-defined heart diseases: Secondary | ICD-10-CM

## 2022-03-15 MED ORDER — HYDRALAZINE HCL 50 MG PO TABS: 75.0000 mg | ORAL_TABLET | Freq: Three times a day (TID) | ORAL | 3 refills | Status: DC

## 2022-03-15 NOTE — Progress Notes (Signed)
?Cardiology Office Note:   ? ?Date:  03/15/2022  ? ?ID:  Diamond [name], DOB [birth year], MRN [id number] ? ?PCP:  [name], MD ?  ?[facility] Providers ?Cardiologist:  Freada Bergeron, MD { ? ? ?Referring MD: [name], MD  ? ? ?History of Present Illness:   ? ?Diamond [name] is a 71 y.o. female with a hx of  ESRD on HD, asthma, HTN, anemia of chronic disease, atrial flutter, and admission in 12/2021 for MRSA bacteremia and IE who presents to clinic for follow-up. ? ?Patient was admitted 12/16/21-12/30/21 due to MRSA bacteremia with hospital course complicated by atrial flutter and bleeding from HD catheter site. TEE 12/22/21 with right atrial/IVC globular mobile mass compatible with thrombotic/vegetative material. Received IV abx through 02/13/22. Incidental finding of lung nodule recommended for repeat CT in 3 mos.  ? ?Was last seen in clinic on 01/2022 by Laurann Montana, NP where she was doing well. No CV symptoms at that time.  ? ?Today, she is doing well and accompanied by her son. She is still recovering from fistula placement on her L-arm.  Her son reports she had an allergic reaction to the anesthesia which caused her lips to swell. She was seen in the ER where she was given IM solumedrol and Benadryl with resolution of symptoms. ? ?After the procedure, she developed a cough with CXR revealing new left lower lobe infiltrate concerning for pneumonia. She is currently on doxycycline. She continues to have cough productive of phelgm, but this is overall improving. Denies fevers, chills, SOB. ? ?She is tolerating Eliquis with no bleeding complications. At home, her blood pressure is highest in the morning but goes down throughout the day. She is able to complete dialysis without drops in blood pressure. Her blood pressure during dialysis is typically in the 409W systolic. She attends dialysis on Tuesday, Thursday, and Saturday. She denies chest pain, chest pressure, dyspnea at rest or with  exertion, PND, orthopnea, or leg swelling.  ? ?Notably, the patient occasional tries to skip HD or tries to leave early. We discussed the importance of needing to go as scheduled as well as staying for the full session. ? ?Past Medical History:  ?Diagnosis Date  ? Anemia   ? Asthma   ? Atrial flutter with rapid ventricular response ([place]) 12/25/2021  ? Chronic kidney disease   ? dialysis Tues Thurs Sat  ? Dysrhythmia   ? PAF 10/2019 in setting of COVID-19 PNA  ? Gout   ? Hypertension   ? ? ?Past Surgical History:  ?Procedure Laterality Date  ? AV FISTULA PLACEMENT Left 12/23/2021  ? Procedure: LEFT ARM BRACHIOBASILIC VEIN ARTERIOVENOUS (AV) FISTULA CREATION;  Surgeon: [name], MD;  Location: [place];  Service: Vascular;  Laterality: Left;  ? BASCILIC VEIN TRANSPOSITION Left 03/08/2022  ? Procedure: LEFT SECOND STAGE BASILIC VEIN TRANSPOSITION;  Surgeon: [name], MD;  Location: [place];  Service: Vascular;  Laterality: Left;  ? BIOPSY  01/19/2022  ? Procedure: BIOPSY;  Surgeon: Lavena Bullion, DO;  Location: [place] ENDOSCOPY;  Service: Gastroenterology;;  ? [place]  ? COLONOSCOPY Left 01/19/2022  ? Procedure: COLONOSCOPY;  Surgeon: Lavena Bullion, DO;  Location: [place] ENDOSCOPY;  Service: Gastroenterology;  Laterality: Left;  ? ESOPHAGOGASTRODUODENOSCOPY Left 01/19/2022  ? Procedure: ESOPHAGOGASTRODUODENOSCOPY (EGD);  Surgeon: Lavena Bullion, DO;  Location: [facility] ENDOSCOPY;  Service: Gastroenterology;  Laterality: Left;  ? INSERTION OF DIALYSIS CATHETER Right 12/23/2021  ? Procedure: INSERTION OF  DIALYSIS CATHETER;  Surgeon: [name], MD;  Location: [place];  Service: Vascular;  Laterality: Right;  ? POLYPECTOMY  01/19/2022  ? Procedure: POLYPECTOMY;  Surgeon: Lavena Bullion, DO;  Location: [place] [facility]OSCOPY;  Service: Gastroenterology;;  ? TEE WITHOUT CARDIOVERSION N/A 12/22/2021  ? Procedure: TRANSESOPHAGEAL ECHOCARDIOGRAM (TEE);  Surgeon: Jerline Pain, MD;  Location: [facility] ENDOSCOPY;   Service: Cardiovascular;  Laterality: N/A;  ? ULTRASOUND GUIDANCE FOR VASCULAR ACCESS  12/23/2021  ? Procedure: ULTRASOUND GUIDANCE FOR VASCULAR ACCESS;  Surgeon: [name], MD;  Location: [place];  Service: Vascular;;  ? ? ?Current Medications: ?Current Meds  ?Medication Sig  ? acetaminophen (TYLENOL) 500 MG tablet Take 1,000 mg by mouth every 6 (six) hours as needed for moderate pain.  ? albuterol (VENTOLIN HFA) 108 (90 Base) MCG/ACT inhaler Inhale 2 puffs into the lungs every 4 (four) hours as needed for wheezing or shortness of breath.  ? allopurinol (ZYLOPRIM) 100 MG tablet Take 100 mg by mouth 2 (two) times daily.  ? apixaban (ELIQUIS) 2.5 MG TABS tablet Take 1 tablet (2.5 mg total) by mouth 2 (two) times daily.  ? cyclobenzaprine (FLEXERIL) 5 MG tablet Take 2.5 mg by mouth daily as needed for muscle spasms.  ? doxycycline (VIBRAMYCIN) 100 MG capsule Take 1 capsule (100 mg total) by mouth 2 (two) times daily for 7 days.  ? fluticasone-salmeterol (ADVAIR) 250-50 MCG/ACT AEPB Inhale 1 puff into the lungs 2 (two) times daily.  ? Lidocaine 4 % PTCH Apply 1 patch topically daily as needed (pain).  ? metoprolol tartrate (LOPRESSOR) 50 MG tablet Take 1 tablet (50 mg total) by mouth 2 (two) times daily.  ? oxyCODONE (ROXICODONE) 5 MG immediate release tablet Take 1 tablet (5 mg total) by mouth every 6 (six) hours as needed for severe pain.  ? pantoprazole (PROTONIX) 40 MG tablet Take 1 tablet (40 mg total) by mouth 2 (two) times daily.  ? sevelamer carbonate (RENVELA) 800 MG tablet Take 2 tablets (1,600 mg total) by mouth 3 (three) times daily with meals.  ? [DISCONTINUED] hydrALAZINE (APRESOLINE) 50 MG tablet Take 1 tablet (50 mg total) by mouth every 8 (eight) hours.  ?  ? ?Allergies:   Shellfish allergy  ? ?Social History  ? ?Socioeconomic History  ? Marital status: Widowed  ?  Spouse name: Not on file  ? Number of children: Not on file  ? Years of education: Not on file  ? Highest education level: Not on file   ?Occupational History  ? Not on file  ?Tobacco Use  ? Smoking status: Never  ? Smokeless tobacco: Never  ?Vaping Use  ? Vaping Use: Never used  ?Substance and Sexual Activity  ? Alcohol use: No  ? Drug use: No  ? Sexual activity: Not Currently  ?  Birth control/protection: Post-menopausal  ?Other Topics Concern  ? Not on file  ?Social History Narrative  ? Not on file  ? ?Social Determinants of Health  ? ?Financial Resource Strain: Not on file  ?Food Insecurity: Not on file  ?Transportation Needs: Not on file  ?Physical Activity: Not on file  ?Stress: Not on file  ?Social Connections: Not on file  ?  ? ?Family History: ?The patient's family history includes Colon cancer in her brother; Hypertension in her father and mother; Lung cancer in her brother. ? ?ROS:   ?Please see the history of present illness.    ?Review of Systems  ?Constitutional:  Negative for malaise/fatigue and weight loss.  ?  HENT:  Negative for congestion and sore throat.   ?Eyes:  Negative for blurred vision.  ?Respiratory:  Positive for cough and sputum production.   ?Cardiovascular:  Negative for chest pain, orthopnea, claudication, leg swelling and PND.  ?Gastrointestinal:  Negative for heartburn and nausea.  ?Genitourinary:  Negative for frequency and urgency.  ?Musculoskeletal:  Positive for myalgias (L arm). Negative for joint pain.  ?     Positive for swelling (L-arm)  ?Skin:  Negative for rash.  ?Neurological:  Negative for dizziness and headaches.  ?Endo/Heme/Allergies:  Does not bruise/bleed easily.  ?Psychiatric/Behavioral:  The patient is not nervous/anxious and does not have insomnia.   ?All other systems reviewed and are negative. ? ?EKGs/Labs/Other Studies Reviewed:   ? ?The following studies were reviewed today: ?TTE 03/03/22: ?IMPRESSIONS  ? 1. Left ventricular ejection fraction, by estimation, is 60 to 65%. The  ?left ventricle has normal function. The left ventricle has no regional  ?wall motion abnormalities. Left ventricular  diastolic parameters are  ?consistent with Grade I diastolic  ?dysfunction (impaired relaxation).  ? 2. Right ventricular systolic function is normal. The right ventricular  ?size is normal.  ? 3. Highly mob

## 2022-03-15 NOTE — Patient Instructions (Signed)
Medication Instructions:  ?Increase Hydralazine to 75 mg 3 times a day  ? ?*If you need a refill on your cardiac medications before your next appointment, please call your pharmacy* ? ? ?Lab Work: ?None ordered  ? ?If you have labs (blood work) drawn today and your tests are completely normal, you will receive your results only by: ?MyChart Message (if you have MyChart) OR ?A paper copy in the mail ?If you have any lab test that is abnormal or we need to change your treatment, we will call you to review the results. ? ? ?Testing/Procedures: ?Your physician has requested that you have an echocardiogram in September. Echocardiography is a painless test that uses sound waves to create images of your heart. It provides your doctor with information about the size and shape of your heart and how well your heart?s chambers and valves are working. This procedure takes approximately one hour. There are no restrictions for this procedure. ? ? ? ?Follow-Up: ?At [facility], you and your health needs are our priority.  As part of our continuing mission to provide you with exceptional heart care, we have created designated Provider Care Teams.  These Care Teams include your primary Cardiologist (physician) and Advanced Practice Providers (APPs -  Physician Assistants and Nurse Practitioners) who all work together to provide you with the care you need, when you need it. ? ?We recommend signing up for the patient portal called "MyChart".  Sign up information is provided on this After Visit Summary.  MyChart is used to connect with patients for Virtual Visits (Telemedicine).  Patients are able to view lab/test results, encounter notes, upcoming appointments, etc.  Non-urgent messages can be sent to your provider as well.   ?To learn more about what you can do with MyChart, go to [email or web address].   ? ?Your next appointment:   ?6 month(s) ? ?The format for your next appointment:   ?In Person ? ?Provider:   ?Freada Bergeron, MD   ? ? ?Other Instructions ? ?Important Information About Sugar ? ? ? ? ?  ?

## 2022-03-16 ENCOUNTER — Telehealth: Payer: Self-pay | Admitting: Cardiology

## 2022-03-16 ENCOUNTER — Encounter: Payer: Self-pay | Admitting: Cardiology

## 2022-03-16 ENCOUNTER — Other Ambulatory Visit: Payer: Self-pay | Admitting: Cardiology

## 2022-03-16 DIAGNOSIS — N186 End stage renal disease: Secondary | ICD-10-CM | POA: Diagnosis not present

## 2022-03-16 DIAGNOSIS — Z992 Dependence on renal dialysis: Secondary | ICD-10-CM | POA: Diagnosis not present

## 2022-03-16 DIAGNOSIS — N2581 Secondary hyperparathyroidism of renal origin: Secondary | ICD-10-CM | POA: Diagnosis not present

## 2022-03-16 MED ORDER — HYDRALAZINE HCL 50 MG PO TABS
50.0000 mg | ORAL_TABLET | Freq: Three times a day (TID) | ORAL | 3 refills | Status: DC
Start: 2022-03-16 — End: 2022-06-30

## 2022-03-16 MED ORDER — HYDRALAZINE HCL 50 MG PO TABS: 75.0000 mg | ORAL_TABLET | Freq: Three times a day (TID) | ORAL | 3 refills | Status: DC

## 2022-03-16 NOTE — Telephone Encounter (Signed)
Per Dr Jacolyn Reedy OV note 03/15/22: ? ?#HTN: ?Elevated to 140-150s even during HD; will increase hydralazine for now and monitor. If drops low during HD, will decrease back to '50mg'$  TID on HD days. ?-Increase hydralazine to '75mg'$  TID ?-Continue metop '50mg'$  BID ? ?Hydralazine dosing was updated in pt's chart, but not sent into pharmacy (NO PRINT selected). Will send into pharmacy now per pt's request.  ?

## 2022-03-16 NOTE — Telephone Encounter (Signed)
Pt c/o medication issue: ? ?1. Name of Medication: hydrALAZINE (APRESOLINE) 50 MG tablet ? ?2. How are you currently taking this medication (dosage and times per day)? Medication was updated on 04/10 ? ?3. Are you having a reaction (difficulty breathing--STAT)? no ? ?4. What is your medication issue? Pharmacist at [facility] wants to verify the dosage on this medication, she wants to know if it has been titrated because the increase does not seem right.   ?

## 2022-03-16 NOTE — Telephone Encounter (Signed)
Thank you so much for the update. We can keep at '50mg'$  TID for now and watch her blood pressures. I will change the order. ?

## 2022-03-16 NOTE — Telephone Encounter (Signed)
Pharmacy called to alert office that the last time hydralazine was filled pt was taking 50 mg PO QD. Pt dose changed to hydralazine 50 mg PO TID on 03/15/22.  Pharmacy wants to ensure this is the correct amount.  Called pt to verify dose but voicemail picked up. Please advise.  ?

## 2022-03-17 DIAGNOSIS — I10 Essential (primary) hypertension: Secondary | ICD-10-CM | POA: Diagnosis not present

## 2022-03-17 DIAGNOSIS — Z131 Encounter for screening for diabetes mellitus: Secondary | ICD-10-CM | POA: Diagnosis not present

## 2022-03-17 DIAGNOSIS — R7309 Other abnormal glucose: Secondary | ICD-10-CM | POA: Diagnosis not present

## 2022-03-17 DIAGNOSIS — R5383 Other fatigue: Secondary | ICD-10-CM | POA: Diagnosis not present

## 2022-03-17 DIAGNOSIS — Z1329 Encounter for screening for other suspected endocrine disorder: Secondary | ICD-10-CM | POA: Diagnosis not present

## 2022-03-17 DIAGNOSIS — Z136 Encounter for screening for cardiovascular disorders: Secondary | ICD-10-CM | POA: Diagnosis not present

## 2022-03-17 DIAGNOSIS — J189 Pneumonia, unspecified organism: Secondary | ICD-10-CM | POA: Diagnosis not present

## 2022-03-17 DIAGNOSIS — E559 Vitamin D deficiency, unspecified: Secondary | ICD-10-CM | POA: Diagnosis not present

## 2022-03-17 DIAGNOSIS — Z0001 Encounter for general adult medical examination with abnormal findings: Secondary | ICD-10-CM | POA: Diagnosis not present

## 2022-03-17 DIAGNOSIS — E7800 Pure hypercholesterolemia, unspecified: Secondary | ICD-10-CM | POA: Diagnosis not present

## 2022-03-17 DIAGNOSIS — J4530 Mild persistent asthma, uncomplicated: Secondary | ICD-10-CM | POA: Diagnosis not present

## 2022-03-17 NOTE — Telephone Encounter (Signed)
Correction: hydralazine was changed to 75 mg PO TID at last OV.   ?

## 2022-03-17 NOTE — Telephone Encounter (Signed)
Left a message to call back.

## 2022-03-18 DIAGNOSIS — Z992 Dependence on renal dialysis: Secondary | ICD-10-CM | POA: Diagnosis not present

## 2022-03-18 DIAGNOSIS — N2581 Secondary hyperparathyroidism of renal origin: Secondary | ICD-10-CM | POA: Diagnosis not present

## 2022-03-18 DIAGNOSIS — N186 End stage renal disease: Secondary | ICD-10-CM | POA: Diagnosis not present

## 2022-03-19 NOTE — Telephone Encounter (Signed)
Called and left pt message asking her to call office back or send MyChart message so that we can clarify dose of hydralazine. ?

## 2022-03-19 NOTE — Telephone Encounter (Signed)
Called pt to make aware that hydralazine dose has been changed to 50 mg PO TID.  Left a message for pt to call the office.  ?

## 2022-03-20 DIAGNOSIS — N2581 Secondary hyperparathyroidism of renal origin: Secondary | ICD-10-CM | POA: Diagnosis not present

## 2022-03-20 DIAGNOSIS — N186 End stage renal disease: Secondary | ICD-10-CM | POA: Diagnosis not present

## 2022-03-20 DIAGNOSIS — Z992 Dependence on renal dialysis: Secondary | ICD-10-CM | POA: Diagnosis not present

## 2022-03-22 NOTE — Telephone Encounter (Signed)
Pt aware to take Hydralazine 50 mg three times a day./cy  ?

## 2022-03-23 DIAGNOSIS — Z992 Dependence on renal dialysis: Secondary | ICD-10-CM | POA: Diagnosis not present

## 2022-03-23 DIAGNOSIS — N2581 Secondary hyperparathyroidism of renal origin: Secondary | ICD-10-CM | POA: Diagnosis not present

## 2022-03-23 DIAGNOSIS — N186 End stage renal disease: Secondary | ICD-10-CM | POA: Diagnosis not present

## 2022-03-25 ENCOUNTER — Ambulatory Visit: Payer: Medicare HMO | Admitting: Cardiology

## 2022-03-25 DIAGNOSIS — N186 End stage renal disease: Secondary | ICD-10-CM | POA: Diagnosis not present

## 2022-03-25 DIAGNOSIS — N2581 Secondary hyperparathyroidism of renal origin: Secondary | ICD-10-CM | POA: Diagnosis not present

## 2022-03-25 DIAGNOSIS — Z992 Dependence on renal dialysis: Secondary | ICD-10-CM | POA: Diagnosis not present

## 2022-03-27 DIAGNOSIS — N186 End stage renal disease: Secondary | ICD-10-CM | POA: Diagnosis not present

## 2022-03-27 DIAGNOSIS — N2581 Secondary hyperparathyroidism of renal origin: Secondary | ICD-10-CM | POA: Diagnosis not present

## 2022-03-27 DIAGNOSIS — Z992 Dependence on renal dialysis: Secondary | ICD-10-CM | POA: Diagnosis not present

## 2022-03-30 DIAGNOSIS — N2581 Secondary hyperparathyroidism of renal origin: Secondary | ICD-10-CM | POA: Diagnosis not present

## 2022-03-30 DIAGNOSIS — Z992 Dependence on renal dialysis: Secondary | ICD-10-CM | POA: Diagnosis not present

## 2022-03-30 DIAGNOSIS — N186 End stage renal disease: Secondary | ICD-10-CM | POA: Diagnosis not present

## 2022-04-03 DIAGNOSIS — N186 End stage renal disease: Secondary | ICD-10-CM | POA: Diagnosis not present

## 2022-04-03 DIAGNOSIS — Z992 Dependence on renal dialysis: Secondary | ICD-10-CM | POA: Diagnosis not present

## 2022-04-03 DIAGNOSIS — N2581 Secondary hyperparathyroidism of renal origin: Secondary | ICD-10-CM | POA: Diagnosis not present

## 2022-04-04 DIAGNOSIS — Z992 Dependence on renal dialysis: Secondary | ICD-10-CM | POA: Diagnosis not present

## 2022-04-04 DIAGNOSIS — I129 Hypertensive chronic kidney disease with stage 1 through stage 4 chronic kidney disease, or unspecified chronic kidney disease: Secondary | ICD-10-CM | POA: Diagnosis not present

## 2022-04-04 DIAGNOSIS — N186 End stage renal disease: Secondary | ICD-10-CM | POA: Diagnosis not present

## 2022-04-06 DIAGNOSIS — N186 End stage renal disease: Secondary | ICD-10-CM | POA: Diagnosis not present

## 2022-04-06 DIAGNOSIS — N2581 Secondary hyperparathyroidism of renal origin: Secondary | ICD-10-CM | POA: Diagnosis not present

## 2022-04-06 DIAGNOSIS — Z992 Dependence on renal dialysis: Secondary | ICD-10-CM | POA: Diagnosis not present

## 2022-04-08 ENCOUNTER — Ambulatory Visit (HOSPITAL_COMMUNITY)
Admission: EM | Admit: 2022-04-08 | Discharge: 2022-04-08 | Disposition: A | Discharge status: Home / Self Care | Payer: Medicare HMO | Attending: Internal Medicine | Admitting: Internal Medicine

## 2022-04-08 ENCOUNTER — Encounter (HOSPITAL_COMMUNITY): Payer: Self-pay

## 2022-04-08 ENCOUNTER — Ambulatory Visit (INDEPENDENT_AMBULATORY_CARE_PROVIDER_SITE_OTHER): Payer: Medicare HMO

## 2022-04-08 DIAGNOSIS — M25572 Pain in left ankle and joints of left foot: Secondary | ICD-10-CM | POA: Diagnosis not present

## 2022-04-08 DIAGNOSIS — W19XXXA Unspecified fall, initial encounter: Secondary | ICD-10-CM | POA: Diagnosis not present

## 2022-04-08 DIAGNOSIS — S93402A Sprain of unspecified ligament of left ankle, initial encounter: Secondary | ICD-10-CM

## 2022-04-08 DIAGNOSIS — M7989 Other specified soft tissue disorders: Secondary | ICD-10-CM | POA: Diagnosis not present

## 2022-04-08 NOTE — Discharge Instructions (Addendum)
Tylenol as needed for pain ?Gentle range of motion exercises once pain begins to improve ?Elevate left extremity ?Icing of the left ankle ?Return to urgent care if symptoms worsen. ? ?

## 2022-04-08 NOTE — ED Triage Notes (Signed)
Pt reports she almost fell x 2 days ago. C/o left ankle pain.  ?

## 2022-04-08 NOTE — ED Provider Notes (Addendum)
?[facility] ? ? ? ?CSN: [id number] ?Arrival date & time: 04/08/22  1649 ? ? ?  ? ?History   ?Chief Complaint ?No chief complaint on file. ? ? ?HPI ?Diamond [name] is a 71 y.o. female comes to the urgent care with left ankle pain which started 2 days ago.  Patient lost her balance while getting out of the car and pressed her ankle against a metal surface.  She did not particularly fall.  Encounter.  Following that the patient has had severe pain of the left ankle associated with swelling.  Pain is aggravated by palpation.  Patient is unable to bear weight on the left ankle.  No bruising noted.  No numbness or tingling.  Patient comes to the urgent care to be evaluated because there is no known relieving factors.  Patient denies hitting his head.  No dizziness or headaches. ? ?HPI ? ?Past Medical History:  ?Diagnosis Date  ? Anemia   ? Asthma   ? Atrial flutter with rapid ventricular response ([place]) 12/25/2021  ? Chronic kidney disease   ? dialysis Tues Thurs Sat  ? Dysrhythmia   ? PAF 10/2019 in setting of COVID-19 PNA  ? Gout   ? Hypertension   ? ? ?Patient Active Problem List  ? Diagnosis Date Noted  ? Acute on chronic anemia with positive fecal occult  02/05/2022  ? Multiple polyps of sigmoid colon   ? Heme positive stool   ? Duodenitis   ? Candida esophagitis ([place])   ? Generalized abdominal pain   ? Gallbladder mass 01/17/2022  ? Acute blood loss anemia 01/16/2022  ? 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  01/16/2022  ? Septic discitis of lumbar region 01/16/2022  ? Unspecified viral hepatitis C without hepatic coma 01/15/2022  ? Sepsis due to methicillin resistant Staphylococcus aureus ([place]) 12/31/2021  ? Hypotension 12/27/2021  ? Bleeding from dialysis catheter site 12/26/2021  ? Atrial flutter ([place]) 12/25/2021  ? 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 seen on TEE on 1/17 12/24/2021  ? Lung nodule seen on imaging study 12/23/2021  ? Vertebral osteomyelitis ([place])    ? Leukocytosis 12/17/2021  ? Severe sepsis ([place]) due to MRSA bacteremia, L5-S1 discitis and endocarditis 12/17/2021  ? Lactic acidosis 12/17/2021  ? Low back pain 12/17/2021  ? ESRD on hemodialysis ([place]) 12/17/2021  ? Mild intermittent asthma 12/17/2021  ? Encounter for screening for other viral diseases 11/03/2021  ? Allergy, unspecified, initial encounter 09/14/2021  ? Coagulation defect, unspecified ([place]) 09/14/2021  ? Complication of vascular dialysis catheter 09/14/2021  ? End stage renal disease ([place]) 09/14/2021  ? Gout due to renal impairment, right ankle and foot 09/14/2021  ? Iron deficiency anemia, unspecified 09/14/2021  ? Pain, unspecified 09/14/2021  ? Pruritus, unspecified 09/14/2021  ? Secondary hyperparathyroidism of renal origin ([place]) 09/14/2021  ? Shortness of breath 09/14/2021  ? Unspecified protein-calorie malnutrition ([place]) 09/14/2021  ? Vitamin D deficiency 01/02/2021  ? Hyperparathyroidism ([place]) 12/31/2020  ? CKD (chronic kidney disease), stage III ([place]) 11/10/2019  ? Cardiomegaly 11/05/2019  ? Hyperkalemia 11/05/2019  ? Hypokalemia 10/18/2019  ? Pneumonia due to COVID-19 virus 10/17/2019  ? CAP (community acquired pneumonia) 10/16/2019  ? Essential hypertension   ? Asthma   ? Gout   ? Hyponatremia   ? Multifactorial anemia-acute blood loss from bleeding HD catheter site-superimposed on anemia related to ESRD.   ? ? ?Past Surgical History:  ?Procedure Laterality Date  ? AV FISTULA PLACEMENT Left 12/23/2021  ?  Procedure: LEFT ARM BRACHIOBASILIC VEIN ARTERIOVENOUS (AV) FISTULA CREATION;  Surgeon: [name], MD;  Location: [place];  Service: Vascular;  Laterality: Left;  ? BASCILIC VEIN TRANSPOSITION Left 03/08/2022  ? Procedure: LEFT SECOND STAGE BASILIC VEIN TRANSPOSITION;  Surgeon: [name], MD;  Location: [place];  Service: Vascular;  Laterality: Left;  ? BIOPSY  01/19/2022  ? Procedure: BIOPSY;  Surgeon: Lavena Bullion, DO;  Location: [place] ENDOSCOPY;  Service: Gastroenterology;;  ?  [place]  ? COLONOSCOPY Left 01/19/2022  ? Procedure: COLONOSCOPY;  Surgeon: Lavena Bullion, DO;  Location: [place] ENDOSCOPY;  Service: Gastroenterology;  Laterality: Left;  ? ESOPHAGOGASTRODUODENOSCOPY Left 01/19/2022  ? Procedure: ESOPHAGOGASTRODUODENOSCOPY (EGD);  Surgeon: Lavena Bullion, DO;  Location: [facility] ENDOSCOPY;  Service: Gastroenterology;  Laterality: Left;  ? INSERTION OF DIALYSIS CATHETER Right 12/23/2021  ? Procedure: INSERTION OF DIALYSIS CATHETER;  Surgeon: [name], MD;  Location: [place];  Service: Vascular;  Laterality: Right;  ? POLYPECTOMY  01/19/2022  ? Procedure: POLYPECTOMY;  Surgeon: Lavena Bullion, DO;  Location: [place] ENDOSCOPY;  Service: Gastroenterology;;  ? TEE WITHOUT CARDIOVERSION N/A 12/22/2021  ? Procedure: TRANSESOPHAGEAL ECHOCARDIOGRAM (TEE);  Surgeon: Jerline Pain, MD;  Location: [facility] ENDOSCOPY;  Service: Cardiovascular;  Laterality: N/A;  ? ULTRASOUND GUIDANCE FOR VASCULAR ACCESS  12/23/2021  ? Procedure: ULTRASOUND GUIDANCE FOR VASCULAR ACCESS;  Surgeon: [name], MD;  Location: [facility] OR;  Service: Vascular;;  ? ? ?OB History   ?No obstetric history on file. ?  ? ? ? ?Home Medications   ? ?Prior to Admission medications   ?Medication Sig Start Date End Date Taking? Authorizing Provider  ?acetaminophen (TYLENOL) 500 MG tablet Take 1,000 mg by mouth every 6 (six) hours as needed for moderate pain.    Raymondo Band, MD  ?albuterol (VENTOLIN HFA) 108 (90 Base) MCG/ACT inhaler Inhale 2 puffs into the lungs every 4 (four) hours as needed for wheezing or shortness of breath. 10/08/19   [provider]  ?allopurinol (ZYLOPRIM) 100 MG tablet Take 100 mg by mouth 2 (two) times daily. 12/03/21   [provider]  ?apixaban (ELIQUIS) 2.5 MG TABS tablet Take 1 tablet (2.5 mg total) by mouth 2 (two) times daily. 02/06/22   Ghimire, Henreitta Leber, MD  ?cyclobenzaprine (FLEXERIL) 5 MG tablet Take 2.5 mg by mouth daily as needed for muscle spasms.     [provider]  ?fluticasone-salmeterol (ADVAIR) 250-50 MCG/ACT AEPB Inhale 1 puff into the lungs 2 (two) times daily. 02/19/22   [provider]  ?hydrALAZINE (APRESOLINE) 50 MG tablet Take 1 tablet (50 mg total) by mouth 3 (three) times daily. 03/16/22 06/14/22  Freada Bergeron, MD  ?Lidocaine 4 % PTCH Apply 1 patch topically daily as needed (pain).    Raymondo Band, MD  ?metoprolol tartrate (LOPRESSOR) 50 MG tablet Take 1 tablet (50 mg total) by mouth 2 (two) times daily. 12/30/21   Ghimire, Henreitta Leber, MD  ?oxyCODONE (ROXICODONE) 5 MG immediate release tablet Take 1 tablet (5 mg total) by mouth every 6 (six) hours as needed for severe pain. 03/08/22   Rhyne, Hulen Shouts, PA-C  ?pantoprazole (PROTONIX) 40 MG tablet Take 1 tablet (40 mg total) by mouth 2 (two) times daily. 01/22/22   Thurnell Lose, MD  ?sevelamer carbonate (RENVELA) 800 MG tablet Take 2 tablets (1,600 mg total) by mouth 3 (three) times daily with meals. 12/30/21   Ghimire, Henreitta Leber, MD  ? ? ?Family History ?  Family History  ?Problem Relation Age of Onset  ? Hypertension Mother   ? Hypertension Father   ? Colon cancer Brother   ? Lung cancer Brother   ? ? ?Social History ?Social History  ? ?Tobacco Use  ? Smoking status: Never  ? Smokeless tobacco: Never  ?Vaping Use  ? Vaping Use: Never used  ?Substance Use Topics  ? Alcohol use: No  ? Drug use: No  ? ? ? ?Allergies   ?Shellfish allergy ? ? ?Review of Systems ?Review of Systems ?As per HPI ? ?Physical Exam ?Triage Vital Signs ?ED Triage Vitals [04/08/22 1813]  ?Enc Vitals Group  ?   BP (!) 179/110  ?   Pulse Rate 87  ?   Resp 18  ?   Temp 99.1 ?F (37.3 ?C)  ?   Temp Source Oral  ?   SpO2 98 %  ?   Weight   ?   Height   ?   Head Circumference   ?   Peak Flow   ?   Pain Score 5  ?   Pain Loc   ?   Pain Edu?   ?   Excl. in [place]?   ? ?No data found. ? ?Updated Vital Signs ?BP (!) 179/110 (BP Location: Left Arm)   Pulse 87   Temp 99.1 ?F (37.3 ?C) (Oral)   Resp 18   SpO2 98%   ? ?Visual Acuity ?Right Eye Distance:   ?Left Eye Distance:   ?Bilateral Distance:   ? ?Right Eye Near:   ?Left Eye Near:    ?Bilateral Near:    ? ?Physical Exam ?Vitals and nursing note reviewed.  ?Constitutional:

## 2022-04-10 DIAGNOSIS — Z992 Dependence on renal dialysis: Secondary | ICD-10-CM | POA: Diagnosis not present

## 2022-04-10 DIAGNOSIS — N2581 Secondary hyperparathyroidism of renal origin: Secondary | ICD-10-CM | POA: Diagnosis not present

## 2022-04-10 DIAGNOSIS — N186 End stage renal disease: Secondary | ICD-10-CM | POA: Diagnosis not present

## 2022-04-13 DIAGNOSIS — Z992 Dependence on renal dialysis: Secondary | ICD-10-CM | POA: Diagnosis not present

## 2022-04-13 DIAGNOSIS — N186 End stage renal disease: Secondary | ICD-10-CM | POA: Diagnosis not present

## 2022-04-13 DIAGNOSIS — N2581 Secondary hyperparathyroidism of renal origin: Secondary | ICD-10-CM | POA: Diagnosis not present

## 2022-04-13 NOTE — Progress Notes (Signed)
?  POST OPERATIVE OFFICE NOTE ? ? ? ?CC:  F/u for surgery ? ?HPI:  This is a 71 y.o. female who is s/p second stage basilic AV fistula on 06/11/27 by Dr. Stanford Breed.   ? ?Pt returns today for follow up.  Pt denies pain, loss of motor or loss of sensation in the left UE.  She is on HD MWF and is followed by DR. Coladonato. ? ? ?Allergies  ?Allergen Reactions  ? Shellfish Allergy   ?  Unknown reaction   ? ? ?Current Outpatient Medications  ?Medication Sig Dispense Refill  ? acetaminophen (TYLENOL) 500 MG tablet Take 1,000 mg by mouth every 6 (six) hours as needed for moderate pain.    ? albuterol (VENTOLIN HFA) 108 (90 Base) MCG/ACT inhaler Inhale 2 puffs into the lungs every 4 (four) hours as needed for wheezing or shortness of breath.    ? allopurinol (ZYLOPRIM) 100 MG tablet Take 100 mg by mouth 2 (two) times daily.    ? apixaban (ELIQUIS) 2.5 MG TABS tablet Take 1 tablet (2.5 mg total) by mouth 2 (two) times daily. 60 tablet 11  ? cyclobenzaprine (FLEXERIL) 5 MG tablet Take 2.5 mg by mouth daily as needed for muscle spasms.    ? fluticasone-salmeterol (ADVAIR) 250-50 MCG/ACT AEPB Inhale 1 puff into the lungs 2 (two) times daily.    ? hydrALAZINE (APRESOLINE) 50 MG tablet Take 1 tablet (50 mg total) by mouth 3 (three) times daily. 270 tablet 3  ? Lidocaine 4 % PTCH Apply 1 patch topically daily as needed (pain).    ? metoprolol tartrate (LOPRESSOR) 50 MG tablet Take 1 tablet (50 mg total) by mouth 2 (two) times daily.    ? oxyCODONE (ROXICODONE) 5 MG immediate release tablet Take 1 tablet (5 mg total) by mouth every 6 (six) hours as needed for severe pain. 20 tablet 0  ? pantoprazole (PROTONIX) 40 MG tablet Take 1 tablet (40 mg total) by mouth 2 (two) times daily. 60 tablet 0  ? sevelamer carbonate (RENVELA) 800 MG tablet Take 2 tablets (1,600 mg total) by mouth 3 (three) times daily with meals.    ? ?No current facility-administered medications for this visit.  ? ? ? ROS:  See HPI ? ?Physical Exam: ? ? ? ?Incision:  Well  healed with visible and palpable fistula thrill. ?Extremities:  Palpable pulse, sensation and motor intact left UE ? ? ? ? ?Assessment/Plan:  This is a 71 y.o. female who is s/p:second stage left UE BB AV fistula ?The fistula is visible and has a palpable thrill.  The fistula may be accessed for HD once it is working well she can be scheduled for [facility] removal. The TDC was placed by Dr. Stanford Breed on 12/23/21. ? ?F/U PRN ? ? ?Laurence Slate, PAC ?[facility] ?[phone] ? ? ?Clinic MD:  Donzetta Matters ?

## 2022-04-14 ENCOUNTER — Ambulatory Visit (INDEPENDENT_AMBULATORY_CARE_PROVIDER_SITE_OTHER): Payer: Medicare HMO | Admitting: Physician Assistant

## 2022-04-14 VITALS — BP 149/92 | HR 70 | Temp 98.0°F | Resp 20 | Ht 67.0 in

## 2022-04-14 DIAGNOSIS — Z992 Dependence on renal dialysis: Secondary | ICD-10-CM

## 2022-04-14 DIAGNOSIS — N186 End stage renal disease: Secondary | ICD-10-CM

## 2022-04-15 DIAGNOSIS — Z992 Dependence on renal dialysis: Secondary | ICD-10-CM | POA: Diagnosis not present

## 2022-04-15 DIAGNOSIS — N186 End stage renal disease: Secondary | ICD-10-CM | POA: Diagnosis not present

## 2022-04-15 DIAGNOSIS — N2581 Secondary hyperparathyroidism of renal origin: Secondary | ICD-10-CM | POA: Diagnosis not present

## 2022-04-17 DIAGNOSIS — Z992 Dependence on renal dialysis: Secondary | ICD-10-CM | POA: Diagnosis not present

## 2022-04-17 DIAGNOSIS — N2581 Secondary hyperparathyroidism of renal origin: Secondary | ICD-10-CM | POA: Diagnosis not present

## 2022-04-17 DIAGNOSIS — N186 End stage renal disease: Secondary | ICD-10-CM | POA: Diagnosis not present

## 2022-04-20 DIAGNOSIS — Z992 Dependence on renal dialysis: Secondary | ICD-10-CM | POA: Diagnosis not present

## 2022-04-20 DIAGNOSIS — N186 End stage renal disease: Secondary | ICD-10-CM | POA: Diagnosis not present

## 2022-04-20 DIAGNOSIS — N2581 Secondary hyperparathyroidism of renal origin: Secondary | ICD-10-CM | POA: Diagnosis not present

## 2022-04-22 DIAGNOSIS — N2581 Secondary hyperparathyroidism of renal origin: Secondary | ICD-10-CM | POA: Diagnosis not present

## 2022-04-22 DIAGNOSIS — N186 End stage renal disease: Secondary | ICD-10-CM | POA: Diagnosis not present

## 2022-04-22 DIAGNOSIS — Z992 Dependence on renal dialysis: Secondary | ICD-10-CM | POA: Diagnosis not present

## 2022-04-24 DIAGNOSIS — Z992 Dependence on renal dialysis: Secondary | ICD-10-CM | POA: Diagnosis not present

## 2022-04-24 DIAGNOSIS — N2581 Secondary hyperparathyroidism of renal origin: Secondary | ICD-10-CM | POA: Diagnosis not present

## 2022-04-24 DIAGNOSIS — N186 End stage renal disease: Secondary | ICD-10-CM | POA: Diagnosis not present

## 2022-04-27 DIAGNOSIS — N186 End stage renal disease: Secondary | ICD-10-CM | POA: Diagnosis not present

## 2022-04-27 DIAGNOSIS — N2581 Secondary hyperparathyroidism of renal origin: Secondary | ICD-10-CM | POA: Diagnosis not present

## 2022-04-27 DIAGNOSIS — Z992 Dependence on renal dialysis: Secondary | ICD-10-CM | POA: Diagnosis not present

## 2022-04-29 DIAGNOSIS — N2581 Secondary hyperparathyroidism of renal origin: Secondary | ICD-10-CM | POA: Diagnosis not present

## 2022-04-29 DIAGNOSIS — Z992 Dependence on renal dialysis: Secondary | ICD-10-CM | POA: Diagnosis not present

## 2022-04-29 DIAGNOSIS — N186 End stage renal disease: Secondary | ICD-10-CM | POA: Diagnosis not present

## 2022-05-01 DIAGNOSIS — N2581 Secondary hyperparathyroidism of renal origin: Secondary | ICD-10-CM | POA: Diagnosis not present

## 2022-05-01 DIAGNOSIS — N186 End stage renal disease: Secondary | ICD-10-CM | POA: Diagnosis not present

## 2022-05-01 DIAGNOSIS — Z992 Dependence on renal dialysis: Secondary | ICD-10-CM | POA: Diagnosis not present

## 2022-05-05 DIAGNOSIS — Z992 Dependence on renal dialysis: Secondary | ICD-10-CM | POA: Diagnosis not present

## 2022-05-05 DIAGNOSIS — I129 Hypertensive chronic kidney disease with stage 1 through stage 4 chronic kidney disease, or unspecified chronic kidney disease: Secondary | ICD-10-CM | POA: Diagnosis not present

## 2022-05-05 DIAGNOSIS — D539 Nutritional anemia, unspecified: Secondary | ICD-10-CM | POA: Diagnosis not present

## 2022-05-05 DIAGNOSIS — N186 End stage renal disease: Secondary | ICD-10-CM | POA: Diagnosis not present

## 2022-05-05 DIAGNOSIS — J189 Pneumonia, unspecified organism: Secondary | ICD-10-CM | POA: Diagnosis not present

## 2022-05-05 DIAGNOSIS — I10 Essential (primary) hypertension: Secondary | ICD-10-CM | POA: Diagnosis not present

## 2022-05-06 DIAGNOSIS — N186 End stage renal disease: Secondary | ICD-10-CM | POA: Diagnosis not present

## 2022-05-06 DIAGNOSIS — N2581 Secondary hyperparathyroidism of renal origin: Secondary | ICD-10-CM | POA: Diagnosis not present

## 2022-05-06 DIAGNOSIS — Z992 Dependence on renal dialysis: Secondary | ICD-10-CM | POA: Diagnosis not present

## 2022-05-08 DIAGNOSIS — N186 End stage renal disease: Secondary | ICD-10-CM | POA: Diagnosis not present

## 2022-05-08 DIAGNOSIS — N2581 Secondary hyperparathyroidism of renal origin: Secondary | ICD-10-CM | POA: Diagnosis not present

## 2022-05-08 DIAGNOSIS — Z992 Dependence on renal dialysis: Secondary | ICD-10-CM | POA: Diagnosis not present

## 2022-05-11 DIAGNOSIS — Z992 Dependence on renal dialysis: Secondary | ICD-10-CM | POA: Diagnosis not present

## 2022-05-11 DIAGNOSIS — N2581 Secondary hyperparathyroidism of renal origin: Secondary | ICD-10-CM | POA: Diagnosis not present

## 2022-05-11 DIAGNOSIS — N186 End stage renal disease: Secondary | ICD-10-CM | POA: Diagnosis not present

## 2022-05-15 DIAGNOSIS — N186 End stage renal disease: Secondary | ICD-10-CM | POA: Diagnosis not present

## 2022-05-15 DIAGNOSIS — N2581 Secondary hyperparathyroidism of renal origin: Secondary | ICD-10-CM | POA: Diagnosis not present

## 2022-05-15 DIAGNOSIS — Z992 Dependence on renal dialysis: Secondary | ICD-10-CM | POA: Diagnosis not present

## 2022-05-18 DIAGNOSIS — Z992 Dependence on renal dialysis: Secondary | ICD-10-CM | POA: Diagnosis not present

## 2022-05-18 DIAGNOSIS — N186 End stage renal disease: Secondary | ICD-10-CM | POA: Diagnosis not present

## 2022-05-18 DIAGNOSIS — N2581 Secondary hyperparathyroidism of renal origin: Secondary | ICD-10-CM | POA: Diagnosis not present

## 2022-05-20 DIAGNOSIS — N186 End stage renal disease: Secondary | ICD-10-CM | POA: Diagnosis not present

## 2022-05-20 DIAGNOSIS — N2581 Secondary hyperparathyroidism of renal origin: Secondary | ICD-10-CM | POA: Diagnosis not present

## 2022-05-20 DIAGNOSIS — Z992 Dependence on renal dialysis: Secondary | ICD-10-CM | POA: Diagnosis not present

## 2022-05-22 DIAGNOSIS — N186 End stage renal disease: Secondary | ICD-10-CM | POA: Diagnosis not present

## 2022-05-22 DIAGNOSIS — N2581 Secondary hyperparathyroidism of renal origin: Secondary | ICD-10-CM | POA: Diagnosis not present

## 2022-05-22 DIAGNOSIS — Z992 Dependence on renal dialysis: Secondary | ICD-10-CM | POA: Diagnosis not present

## 2022-05-27 DIAGNOSIS — N2581 Secondary hyperparathyroidism of renal origin: Secondary | ICD-10-CM | POA: Diagnosis not present

## 2022-05-27 DIAGNOSIS — N186 End stage renal disease: Secondary | ICD-10-CM | POA: Diagnosis not present

## 2022-05-27 DIAGNOSIS — Z992 Dependence on renal dialysis: Secondary | ICD-10-CM | POA: Diagnosis not present

## 2022-05-29 DIAGNOSIS — N186 End stage renal disease: Secondary | ICD-10-CM | POA: Diagnosis not present

## 2022-05-29 DIAGNOSIS — Z992 Dependence on renal dialysis: Secondary | ICD-10-CM | POA: Diagnosis not present

## 2022-05-29 DIAGNOSIS — N2581 Secondary hyperparathyroidism of renal origin: Secondary | ICD-10-CM | POA: Diagnosis not present

## 2022-06-01 DIAGNOSIS — N2581 Secondary hyperparathyroidism of renal origin: Secondary | ICD-10-CM | POA: Diagnosis not present

## 2022-06-01 DIAGNOSIS — N186 End stage renal disease: Secondary | ICD-10-CM | POA: Diagnosis not present

## 2022-06-01 DIAGNOSIS — Z992 Dependence on renal dialysis: Secondary | ICD-10-CM | POA: Diagnosis not present

## 2022-06-03 DIAGNOSIS — Z992 Dependence on renal dialysis: Secondary | ICD-10-CM | POA: Diagnosis not present

## 2022-06-03 DIAGNOSIS — N2581 Secondary hyperparathyroidism of renal origin: Secondary | ICD-10-CM | POA: Diagnosis not present

## 2022-06-03 DIAGNOSIS — N186 End stage renal disease: Secondary | ICD-10-CM | POA: Diagnosis not present

## 2022-06-04 DIAGNOSIS — N186 End stage renal disease: Secondary | ICD-10-CM | POA: Diagnosis not present

## 2022-06-04 DIAGNOSIS — I129 Hypertensive chronic kidney disease with stage 1 through stage 4 chronic kidney disease, or unspecified chronic kidney disease: Secondary | ICD-10-CM | POA: Diagnosis not present

## 2022-06-04 DIAGNOSIS — Z992 Dependence on renal dialysis: Secondary | ICD-10-CM | POA: Diagnosis not present

## 2022-06-05 DIAGNOSIS — N2581 Secondary hyperparathyroidism of renal origin: Secondary | ICD-10-CM | POA: Diagnosis not present

## 2022-06-05 DIAGNOSIS — N186 End stage renal disease: Secondary | ICD-10-CM | POA: Diagnosis not present

## 2022-06-05 DIAGNOSIS — Z992 Dependence on renal dialysis: Secondary | ICD-10-CM | POA: Diagnosis not present

## 2022-06-07 ENCOUNTER — Other Ambulatory Visit: Payer: Self-pay

## 2022-06-07 ENCOUNTER — Encounter (HOSPITAL_COMMUNITY): Payer: Self-pay

## 2022-06-07 ENCOUNTER — Emergency Department (HOSPITAL_COMMUNITY)
Admission: EM | Admit: 2022-06-07 | Discharge: 2022-06-08 | Discharge status: Against Medical Advice | Payer: Medicare HMO | Attending: Emergency Medicine | Admitting: Emergency Medicine

## 2022-06-07 DIAGNOSIS — R112 Nausea with vomiting, unspecified: Secondary | ICD-10-CM | POA: Diagnosis not present

## 2022-06-07 DIAGNOSIS — Z5321 Procedure and treatment not carried out due to patient leaving prior to being seen by health care provider: Secondary | ICD-10-CM | POA: Insufficient documentation

## 2022-06-07 DIAGNOSIS — R1084 Generalized abdominal pain: Secondary | ICD-10-CM | POA: Diagnosis not present

## 2022-06-07 DIAGNOSIS — R109 Unspecified abdominal pain: Secondary | ICD-10-CM | POA: Diagnosis not present

## 2022-06-07 DIAGNOSIS — R197 Diarrhea, unspecified: Secondary | ICD-10-CM | POA: Insufficient documentation

## 2022-06-07 DIAGNOSIS — I10 Essential (primary) hypertension: Secondary | ICD-10-CM | POA: Diagnosis not present

## 2022-06-07 LAB — URINALYSIS, ROUTINE W REFLEX MICROSCOPIC
Bacteria, UA: NONE SEEN
Bilirubin Urine: NEGATIVE
Glucose, UA: 50 mg/dL — AB
Hgb urine dipstick: NEGATIVE
Ketones, ur: NEGATIVE mg/dL
Leukocytes,Ua: NEGATIVE
Nitrite: NEGATIVE
Protein, ur: 100 mg/dL — AB
Specific Gravity, Urine: 1.008 (ref 1.005–1.030)
pH: 9 — ABNORMAL HIGH (ref 5.0–8.0)

## 2022-06-07 LAB — CBC WITH DIFFERENTIAL/PLATELET
Abs Immature Granulocytes: 0.02 10*3/uL (ref 0.00–0.07)
Basophils Absolute: 0 10*3/uL (ref 0.0–0.1)
Basophils Relative: 0 %
Eosinophils Absolute: 0.2 10*3/uL (ref 0.0–0.5)
Eosinophils Relative: 3 %
HCT: 40.7 % (ref 36.0–46.0)
Hemoglobin: 13 g/dL (ref 12.0–15.0)
Immature Granulocytes: 0 %
Lymphocytes Relative: 16 %
Lymphs Abs: 1 10*3/uL (ref 0.7–4.0)
MCH: 32.3 pg (ref 26.0–34.0)
MCHC: 31.9 g/dL (ref 30.0–36.0)
MCV: 101 fL — ABNORMAL HIGH (ref 80.0–100.0)
Monocytes Absolute: 0.5 10*3/uL (ref 0.1–1.0)
Monocytes Relative: 8 %
Neutro Abs: 4.5 10*3/uL (ref 1.7–7.7)
Neutrophils Relative %: 73 %
Platelets: 117 10*3/uL — ABNORMAL LOW (ref 150–400)
RBC: 4.03 MIL/uL (ref 3.87–5.11)
RDW: 19.5 % — ABNORMAL HIGH (ref 11.5–15.5)
WBC: 6.1 10*3/uL (ref 4.0–10.5)
nRBC: 0 % (ref 0.0–0.2)

## 2022-06-07 LAB — COMPREHENSIVE METABOLIC PANEL
ALT: 14 U/L (ref 0–44)
AST: 24 U/L (ref 15–41)
Albumin: 4.2 g/dL (ref 3.5–5.0)
Alkaline Phosphatase: 59 U/L (ref 38–126)
Anion gap: 13 (ref 5–15)
BUN: 50 mg/dL — ABNORMAL HIGH (ref 8–23)
CO2: 22 mmol/L (ref 22–32)
Calcium: 10.3 mg/dL (ref 8.9–10.3)
Chloride: 105 mmol/L (ref 98–111)
Creatinine, Ser: 7.92 mg/dL — ABNORMAL HIGH (ref 0.44–1.00)
GFR, Estimated: 5 mL/min — ABNORMAL LOW (ref >60)
Glucose, Bld: 100 mg/dL — ABNORMAL HIGH (ref 70–99)
Potassium: 4.5 mmol/L (ref 3.5–5.1)
Sodium: 140 mmol/L (ref 135–145)
Total Bilirubin: 1.1 mg/dL (ref 0.3–1.2)
Total Protein: 8.1 g/dL (ref 6.5–8.1)

## 2022-06-07 LAB — LIPASE, BLOOD: Lipase: 41 U/L (ref 11–51)

## 2022-06-07 NOTE — ED Triage Notes (Signed)
Pt BIB EMS with reports of vomiting, diarrhea, and abdominal pain x 1 day. Pt reports eating a fish plate at church yesterday.

## 2022-06-07 NOTE — ED Provider Triage Note (Signed)
Emergency Medicine Provider Triage Evaluation Note  Diamond [name] , a 71 y.o. female  was evaluated in triage.  Pt complains of abdominal pain, nausea and vomiting, diarrhea since yesterday.  Patient reports the symptoms began after eating a fish plate from a church ride.  Patient states that she brought the fish plate home, immediately ate it.  The patient is unsure if other people are experiencing similar symptoms.  Patient denies any fever.  Review of Systems  Positive:  Negative:   Physical Exam  BP (!) 220/111 (BP Location: Right Arm)   Pulse 69   Temp 98.2 F (36.8 C) (Oral)   Resp 17   SpO2 100%  Gen:   Awake, no distress   Resp:  Normal effort  MSK:   Moves extremities without difficulty  Other:  Abdomen soft and compressible in all 4 quadrants  Medical Decision Making  Medically screening exam initiated at 10:18 PM.  Appropriate orders placed.  CARRIGAN DELAFUENTE was informed that the remainder of the evaluation will be completed by another provider, this initial triage assessment does not replace that evaluation, and the importance of remaining in the ED until their evaluation is complete.     Azucena Cecil, PA-C 06/07/22 2220

## 2022-06-08 ENCOUNTER — Ambulatory Visit (HOSPITAL_COMMUNITY)
Admission: EM | Admit: 2022-06-08 | Discharge: 2022-06-08 | Disposition: A | Discharge status: Home / Self Care | Payer: Medicare HMO | Attending: Emergency Medicine | Admitting: Emergency Medicine

## 2022-06-08 ENCOUNTER — Encounter (HOSPITAL_COMMUNITY): Payer: Self-pay | Admitting: Emergency Medicine

## 2022-06-08 DIAGNOSIS — A084 Viral intestinal infection, unspecified: Secondary | ICD-10-CM

## 2022-06-08 MED ORDER — ONDANSETRON 4 MG PO TBDP: 4.0000 mg | ORAL_TABLET | Freq: Three times a day (TID) | ORAL | 0 refills | Status: DC | PRN

## 2022-06-08 NOTE — Discharge Instructions (Signed)
Your symptoms are most likely caused by a virus, it will work its way out your system over the next few days  You can use zofran every 8 hours as needed for nausea, be mindful this medication may make you drowsy, take the first dose at home to see how it affects your body  You can use Imodium  to help with diarrhea, and be mindful over use of this medication may cause opposite effect constipation  You can use over-the-counter ibuprofen or Tylenol, which ever you have at home, to help manage fevers  Continue to promote hydration throughout the day by using electrolyte replacement solution such as Gatorade, body armor, Pedialyte, which ever you have at home  Try eating bland foods such as bread, rice, toast, fruit which are easier on the stomach to digest, avoid foods that are overly spicy, overly seasoned or greasy  

## 2022-06-08 NOTE — ED Triage Notes (Signed)
Pt reports stomach started bubbling and roaring since Sunday with some n/v/d. Pt reports that pain isnt as bad as yesterday. Took Pepto yesterday which helped a little.  Pt went to [facility] yesterday but left before being seen.

## 2022-06-08 NOTE — ED Provider Notes (Signed)
[place]    CSN: [id number] Arrival date & time: 06/08/22  0950      History   Chief Complaint Chief Complaint  Patient presents with   Abdominal Pain   Diarrhea   Emesis    HPI Diamond [name] is a 71 y.o. female.   Patient presents with intermittent generalized abdominal pain, nausea, vomiting, diarrhea and abdominal bloating beginning 3 days ago.  Abdominal pain is generalized and described as a soreness.  Last episode of diarrhea 1 day ago.  Has been able to tolerate a banana and water this morning .  Denies fever, chills, body aches, URI symptoms.  No known sick contacts.  Attempted to be seen at the [facility] emergency department last night but left prior to evaluation.  Has attempted use of Pepto-Bismol which has been effective.   Past Medical History:  Diagnosis Date   Anemia    Asthma    Atrial flutter with rapid ventricular response ([place]) 12/25/2021   Chronic kidney disease    dialysis Tues Thurs Sat   Dysrhythmia    PAF 10/2019 in setting of COVID-19 PNA   Gout    Hypertension     Patient Active Problem List   Diagnosis Date Noted   Acute on chronic anemia with positive fecal occult  02/05/2022   Multiple polyps of sigmoid colon    Heme positive stool    Duodenitis    Candida esophagitis (HCC)    Generalized abdominal pain    Gallbladder mass 01/17/2022   Acute blood loss anemia 01/16/2022   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  01/16/2022   Septic discitis of lumbar region 01/16/2022   Unspecified viral hepatitis C without hepatic coma 01/15/2022   Sepsis due to methicillin resistant Staphylococcus aureus ([place]) 12/31/2021   Hypotension 12/27/2021   Bleeding from dialysis catheter site 12/26/2021   Atrial flutter ([place]) 12/25/2021   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 seen on TEE on 1/17 12/24/2021   Lung nodule seen on imaging study 12/23/2021   Vertebral osteomyelitis ([place])    Leukocytosis  12/17/2021   Severe sepsis ([place]) due to MRSA bacteremia, L5-S1 discitis and endocarditis 12/17/2021   Lactic acidosis 12/17/2021   Low back pain 12/17/2021   ESRD on hemodialysis ([place]) 12/17/2021   Mild intermittent asthma 12/17/2021   Encounter for screening for other viral diseases 11/03/2021   Allergy, unspecified, initial encounter 09/14/2021   Coagulation defect, unspecified ([place]) 96/78/9381   Complication of vascular dialysis catheter 09/14/2021   End stage renal disease ([place]) 09/14/2021   Gout due to renal impairment, right ankle and foot 09/14/2021   Iron deficiency anemia, unspecified 09/14/2021   Pain, unspecified 09/14/2021   Pruritus, unspecified 09/14/2021   Secondary hyperparathyroidism of renal origin ([place]) 09/14/2021   Shortness of breath 09/14/2021   Unspecified protein-calorie malnutrition ([place]) 09/14/2021   Vitamin D deficiency 01/02/2021   Hyperparathyroidism ([place]) 12/31/2020   CKD (chronic kidney disease), stage III ([place]) 11/10/2019   Cardiomegaly 11/05/2019   Hyperkalemia 11/05/2019   Hypokalemia 10/18/2019   Pneumonia due to COVID-19 virus 10/17/2019   CAP (community acquired pneumonia) 10/16/2019   Essential hypertension    Asthma    Gout    Hyponatremia    Multifactorial anemia-acute blood loss from bleeding HD catheter site-superimposed on anemia related to ESRD.     Past Surgical History:  Procedure Laterality Date   AV FISTULA PLACEMENT Left 12/23/2021   Procedure: LEFT ARM BRACHIOBASILIC VEIN ARTERIOVENOUS (AV) FISTULA CREATION;  Surgeon: [name], MD;  Location: [place];  Service: Vascular;  Laterality: Left;   [place] Left 03/08/2022   Procedure: LEFT SECOND STAGE BASILIC VEIN TRANSPOSITION;  Surgeon: [name], MD;  Location: [place];  Service: Vascular;  Laterality: Left;   BIOPSY  01/19/2022   Procedure: BIOPSY;  Surgeon: Lavena Bullion, DO;  Location: [facility];  Service: Gastroenterology;;   [place]   COLONOSCOPY Left 01/19/2022   Procedure: COLONOSCOPY;  Surgeon: Lavena Bullion, DO;  Location: [place];  Service: Gastroenterology;  Laterality: Left;   ESOPHAGOGASTRODUODENOSCOPY Left 01/19/2022   Procedure: ESOPHAGOGASTRODUODENOSCOPY (EGD);  Surgeon: Lavena Bullion, DO;  Location: [facility] ENDOSCOPY;  Service: Gastroenterology;  Laterality: Left;   INSERTION OF DIALYSIS CATHETER Right 12/23/2021   Procedure: INSERTION OF DIALYSIS CATHETER;  Surgeon: [name], MD;  Location: [place];  Service: Vascular;  Laterality: Right;   POLYPECTOMY  01/19/2022   Procedure: POLYPECTOMY;  Surgeon: Lavena Bullion, DO;  Location: [place] [facility];  Service: Gastroenterology;;   TEE WITHOUT CARDIOVERSION N/A 12/22/2021   Procedure: TRANSESOPHAGEAL ECHOCARDIOGRAM (TEE);  Surgeon: Jerline Pain, MD;  Location: [facility] ENDOSCOPY;  Service: Cardiovascular;  Laterality: N/A;   ULTRASOUND GUIDANCE FOR VASCULAR ACCESS  12/23/2021   Procedure: ULTRASOUND GUIDANCE FOR VASCULAR ACCESS;  Surgeon: [name], MD;  Location: [facility] OR;  Service: Vascular;;    OB History   No obstetric history on file.      Home Medications    Prior to Admission medications   Medication Sig Start Date End Date Taking? Authorizing Provider  acetaminophen (TYLENOL) 500 MG tablet Take 1,000 mg by mouth every 6 (six) hours as needed for moderate pain.    Raymondo Band, MD  albuterol (VENTOLIN HFA) 108 (90 Base) MCG/ACT inhaler Inhale 2 puffs into the lungs every 4 (four) hours as needed for wheezing or shortness of breath. 10/08/19   [provider]  allopurinol (ZYLOPRIM) 100 MG tablet Take 100 mg by mouth 2 (two) times daily. 12/03/21   [provider]  apixaban (ELIQUIS) 2.5 MG TABS tablet Take 1 tablet (2.5 mg total) by mouth 2 (two) times daily. 02/06/22   Ghimire, Henreitta Leber, MD  cyclobenzaprine (FLEXERIL) 5 MG tablet Take 2.5 mg by mouth daily as needed for muscle spasms.    [provider]  fluticasone-salmeterol (ADVAIR) 250-50 MCG/ACT AEPB Inhale 1 puff into the lungs 2 (two) times daily. 02/19/22   [provider]  hydrALAZINE (APRESOLINE) 50 MG tablet Take 1 tablet (50 mg total) by mouth 3 (three) times daily. 03/16/22 06/14/22  Freada Bergeron, MD  Lidocaine 4 % PTCH Apply 1 patch topically daily as needed (pain).    Raymondo Band, MD  metoprolol tartrate (LOPRESSOR) 50 MG tablet Take 1 tablet (50 mg total) by mouth 2 (two) times daily. 12/30/21   Ghimire, Henreitta Leber, MD  oxyCODONE (ROXICODONE) 5 MG immediate release tablet Take 1 tablet (5 mg total) by mouth every 6 (six) hours as needed for severe pain. 03/08/22   Rhyne, Hulen Shouts, PA-C  pantoprazole (PROTONIX) 40 MG tablet Take 1 tablet (40 mg total) by mouth 2 (two) times daily. 01/22/22   Thurnell Lose, MD  sevelamer carbonate (RENVELA) 800 MG tablet Take 2 tablets (1,600 mg total) by mouth 3 (three) times daily with meals. 12/30/21   Ghimire, Henreitta Leber, MD    Family History Family History  Problem Relation Age of Onset  Hypertension Mother    Hypertension Father    Colon cancer Brother    Lung cancer Brother     Social History Social History   Tobacco Use   Smoking status: Never    Passive exposure: Never   Smokeless tobacco: Never  Vaping Use   Vaping Use: Never used  Substance Use Topics   Alcohol use: No   Drug use: No     Allergies   Shellfish allergy   Review of Systems Review of Systems  Constitutional: Negative.   Respiratory: Negative.    Cardiovascular: Negative.   Gastrointestinal:  Positive for abdominal pain, diarrhea and vomiting. Negative for abdominal distention, anal bleeding, blood in stool, constipation, nausea and rectal pain.  Skin: Negative.   Neurological: Negative.      Physical Exam Triage Vital Signs ED Triage Vitals [06/08/22 1016]  Enc Vitals Group     BP (!) 192/109     Pulse Rate 74     Resp 18     Temp 98.9 F (37.2 C)      Temp Source Oral     SpO2 98 %     Weight      Height      Head Circumference      Peak Flow      Pain Score      Pain Loc      Pain Edu?      Excl. in [place]?    No data found.  Updated Vital Signs BP (!) 192/109 (BP Location: Right Arm)   Pulse 74   Temp 98.9 F (37.2 C) (Oral)   Resp 18   SpO2 98%   Visual Acuity Right Eye Distance:   Left Eye Distance:   Bilateral Distance:    Right Eye Near:   Left Eye Near:    Bilateral Near:     Physical Exam Constitutional:      Appearance: Normal appearance.  HENT:     Head: Normocephalic.  Eyes:     Extraocular Movements: Extraocular movements intact.  Pulmonary:     Effort: Pulmonary effort is normal.  Abdominal:     General: Abdomen is flat. Bowel sounds are normal.     Palpations: Abdomen is soft.     Tenderness: There is generalized abdominal tenderness.  Skin:    General: Skin is warm and dry.  Neurological:     Mental Status: She is alert and oriented to person, place, and time. Mental status is at baseline.  Psychiatric:        Mood and Affect: Mood normal.        Behavior: Behavior normal.      UC Treatments / Results  Labs (all labs ordered are listed, but only abnormal results are displayed) Labs Reviewed - No data to display  EKG   Radiology No results found.  Procedures Procedures (including critical care time)  Medications Ordered in UC Medications - No data to display  Initial Impression / Assessment and Plan / UC Course  I have reviewed the triage vital signs and the nursing notes.  Pertinent labs & imaging results that were available during my care of the patient were reviewed by me and considered in my medical decision making (see chart for details).  Viral gastroenteritis  Patient is in no signs of distress, mild tenderness noted to the abdomen most likely due to frequent diarrhea and vomiting, discussed with patient, etiology is most likely viral as symptoms already begun to  improve, prescribe  Zofran and may use over-the-counter Imodium as needed, advised increase fluid intake as diagnostics from emergency department visit showing signs of early dehydration, may follow-up with urgent care as needed if the symptoms persist or worsen Final Clinical Impressions(s) / UC Diagnoses   Final diagnoses:  None   Discharge Instructions   None    ED Prescriptions   None    PDMP not reviewed this encounter.   Hans Eden, NP 06/08/22 1052

## 2022-06-11 DIAGNOSIS — K219 Gastro-esophageal reflux disease without esophagitis: Secondary | ICD-10-CM | POA: Diagnosis not present

## 2022-06-11 DIAGNOSIS — Z992 Dependence on renal dialysis: Secondary | ICD-10-CM | POA: Diagnosis not present

## 2022-06-11 DIAGNOSIS — E559 Vitamin D deficiency, unspecified: Secondary | ICD-10-CM | POA: Diagnosis not present

## 2022-06-11 DIAGNOSIS — D539 Nutritional anemia, unspecified: Secondary | ICD-10-CM | POA: Diagnosis not present

## 2022-06-11 DIAGNOSIS — N186 End stage renal disease: Secondary | ICD-10-CM | POA: Diagnosis not present

## 2022-06-11 DIAGNOSIS — K2970 Gastritis, unspecified, without bleeding: Secondary | ICD-10-CM | POA: Diagnosis not present

## 2022-06-11 DIAGNOSIS — M4646 Discitis, unspecified, lumbar region: Secondary | ICD-10-CM | POA: Diagnosis not present

## 2022-06-11 DIAGNOSIS — I10 Essential (primary) hypertension: Secondary | ICD-10-CM | POA: Diagnosis not present

## 2022-06-11 DIAGNOSIS — M109 Gout, unspecified: Secondary | ICD-10-CM | POA: Diagnosis not present

## 2022-06-12 ENCOUNTER — Emergency Department (HOSPITAL_COMMUNITY): Payer: Medicare HMO

## 2022-06-12 ENCOUNTER — Encounter (HOSPITAL_COMMUNITY): Payer: Self-pay

## 2022-06-12 ENCOUNTER — Other Ambulatory Visit: Payer: Self-pay

## 2022-06-12 ENCOUNTER — Inpatient Hospital Stay (HOSPITAL_COMMUNITY)
Admission: EM | Admit: 2022-06-12 | Discharge: 2022-06-30 | DRG: 264 | Disposition: A | Discharge status: Skilled Nursing Facility | Payer: Medicare HMO | Attending: Internal Medicine | Admitting: Internal Medicine

## 2022-06-12 DIAGNOSIS — A4102 Sepsis due to Methicillin resistant Staphylococcus aureus: Secondary | ICD-10-CM | POA: Diagnosis not present

## 2022-06-12 DIAGNOSIS — Z800 Family history of malignant neoplasm of digestive organs: Secondary | ICD-10-CM

## 2022-06-12 DIAGNOSIS — E440 Moderate protein-calorie malnutrition: Secondary | ICD-10-CM | POA: Diagnosis not present

## 2022-06-12 DIAGNOSIS — K8066 Calculus of gallbladder and bile duct with acute and chronic cholecystitis without obstruction: Secondary | ICD-10-CM | POA: Diagnosis not present

## 2022-06-12 DIAGNOSIS — E871 Hypo-osmolality and hyponatremia: Secondary | ICD-10-CM | POA: Diagnosis not present

## 2022-06-12 DIAGNOSIS — R7881 Bacteremia: Secondary | ICD-10-CM | POA: Diagnosis not present

## 2022-06-12 DIAGNOSIS — Z8616 Personal history of COVID-19: Secondary | ICD-10-CM

## 2022-06-12 DIAGNOSIS — A419 Sepsis, unspecified organism: Secondary | ICD-10-CM | POA: Diagnosis not present

## 2022-06-12 DIAGNOSIS — Z7901 Long term (current) use of anticoagulants: Secondary | ICD-10-CM

## 2022-06-12 DIAGNOSIS — Z91013 Allergy to seafood: Secondary | ICD-10-CM

## 2022-06-12 DIAGNOSIS — R9431 Abnormal electrocardiogram [ECG] [EKG]: Secondary | ICD-10-CM | POA: Diagnosis not present

## 2022-06-12 DIAGNOSIS — L8990 Pressure ulcer of unspecified site, unspecified stage: Secondary | ICD-10-CM | POA: Insufficient documentation

## 2022-06-12 DIAGNOSIS — N2581 Secondary hyperparathyroidism of renal origin: Secondary | ICD-10-CM | POA: Diagnosis present

## 2022-06-12 DIAGNOSIS — I10 Essential (primary) hypertension: Secondary | ICD-10-CM | POA: Diagnosis not present

## 2022-06-12 DIAGNOSIS — I4892 Unspecified atrial flutter: Secondary | ICD-10-CM | POA: Diagnosis present

## 2022-06-12 DIAGNOSIS — Y838 Other surgical procedures as the cause of abnormal reaction of the patient, or of later complication, without mention of misadventure at the time of the procedure: Secondary | ICD-10-CM | POA: Diagnosis present

## 2022-06-12 DIAGNOSIS — E876 Hypokalemia: Secondary | ICD-10-CM | POA: Diagnosis not present

## 2022-06-12 DIAGNOSIS — I330 Acute and subacute infective endocarditis: Secondary | ICD-10-CM | POA: Diagnosis not present

## 2022-06-12 DIAGNOSIS — Z8701 Personal history of pneumonia (recurrent): Secondary | ICD-10-CM

## 2022-06-12 DIAGNOSIS — E875 Hyperkalemia: Secondary | ICD-10-CM | POA: Diagnosis present

## 2022-06-12 DIAGNOSIS — K828 Other specified diseases of gallbladder: Secondary | ICD-10-CM | POA: Diagnosis not present

## 2022-06-12 DIAGNOSIS — L89152 Pressure ulcer of sacral region, stage 2: Secondary | ICD-10-CM | POA: Diagnosis present

## 2022-06-12 DIAGNOSIS — D631 Anemia in chronic kidney disease: Secondary | ICD-10-CM | POA: Diagnosis present

## 2022-06-12 DIAGNOSIS — K219 Gastro-esophageal reflux disease without esophagitis: Secondary | ICD-10-CM | POA: Diagnosis present

## 2022-06-12 DIAGNOSIS — K8010 Calculus of gallbladder with chronic cholecystitis without obstruction: Secondary | ICD-10-CM | POA: Diagnosis not present

## 2022-06-12 DIAGNOSIS — J45909 Unspecified asthma, uncomplicated: Secondary | ICD-10-CM | POA: Diagnosis present

## 2022-06-12 DIAGNOSIS — R5381 Other malaise: Secondary | ICD-10-CM | POA: Diagnosis not present

## 2022-06-12 DIAGNOSIS — Z6820 Body mass index (BMI) 20.0-20.9, adult: Secondary | ICD-10-CM

## 2022-06-12 DIAGNOSIS — Z91158 Patient's noncompliance with renal dialysis for other reason: Secondary | ICD-10-CM

## 2022-06-12 DIAGNOSIS — G8929 Other chronic pain: Secondary | ICD-10-CM | POA: Diagnosis present

## 2022-06-12 DIAGNOSIS — G9341 Metabolic encephalopathy: Secondary | ICD-10-CM | POA: Diagnosis present

## 2022-06-12 DIAGNOSIS — M109 Gout, unspecified: Secondary | ICD-10-CM | POA: Diagnosis present

## 2022-06-12 DIAGNOSIS — D62 Acute posthemorrhagic anemia: Secondary | ICD-10-CM | POA: Diagnosis not present

## 2022-06-12 DIAGNOSIS — N250 Renal osteodystrophy: Secondary | ICD-10-CM | POA: Diagnosis not present

## 2022-06-12 DIAGNOSIS — Z7951 Long term (current) use of inhaled steroids: Secondary | ICD-10-CM

## 2022-06-12 DIAGNOSIS — Z992 Dependence on renal dialysis: Secondary | ICD-10-CM | POA: Diagnosis not present

## 2022-06-12 DIAGNOSIS — M898X9 Other specified disorders of bone, unspecified site: Secondary | ICD-10-CM | POA: Diagnosis present

## 2022-06-12 DIAGNOSIS — T80211A Bloodstream infection due to central venous catheter, initial encounter: Principal | ICD-10-CM | POA: Diagnosis present

## 2022-06-12 DIAGNOSIS — R4182 Altered mental status, unspecified: Secondary | ICD-10-CM | POA: Diagnosis not present

## 2022-06-12 DIAGNOSIS — R1084 Generalized abdominal pain: Secondary | ICD-10-CM | POA: Diagnosis not present

## 2022-06-12 DIAGNOSIS — Z452 Encounter for adjustment and management of vascular access device: Secondary | ICD-10-CM | POA: Diagnosis not present

## 2022-06-12 DIAGNOSIS — M545 Low back pain, unspecified: Secondary | ICD-10-CM

## 2022-06-12 DIAGNOSIS — Z801 Family history of malignant neoplasm of trachea, bronchus and lung: Secondary | ICD-10-CM

## 2022-06-12 DIAGNOSIS — I480 Paroxysmal atrial fibrillation: Secondary | ICD-10-CM | POA: Diagnosis present

## 2022-06-12 DIAGNOSIS — Z8249 Family history of ischemic heart disease and other diseases of the circulatory system: Secondary | ICD-10-CM

## 2022-06-12 DIAGNOSIS — N186 End stage renal disease: Secondary | ICD-10-CM | POA: Diagnosis present

## 2022-06-12 DIAGNOSIS — R6520 Severe sepsis without septic shock: Secondary | ICD-10-CM | POA: Diagnosis not present

## 2022-06-12 DIAGNOSIS — N200 Calculus of kidney: Secondary | ICD-10-CM | POA: Diagnosis present

## 2022-06-12 DIAGNOSIS — N281 Cyst of kidney, acquired: Secondary | ICD-10-CM | POA: Diagnosis not present

## 2022-06-12 DIAGNOSIS — Z79899 Other long term (current) drug therapy: Secondary | ICD-10-CM

## 2022-06-12 DIAGNOSIS — E8770 Fluid overload, unspecified: Secondary | ICD-10-CM | POA: Diagnosis present

## 2022-06-12 DIAGNOSIS — I120 Hypertensive chronic kidney disease with stage 5 chronic kidney disease or end stage renal disease: Secondary | ICD-10-CM | POA: Diagnosis not present

## 2022-06-12 DIAGNOSIS — B9562 Methicillin resistant Staphylococcus aureus infection as the cause of diseases classified elsewhere: Secondary | ICD-10-CM | POA: Diagnosis present

## 2022-06-12 DIAGNOSIS — J984 Other disorders of lung: Secondary | ICD-10-CM | POA: Diagnosis not present

## 2022-06-12 DIAGNOSIS — I081 Rheumatic disorders of both mitral and tricuspid valves: Secondary | ICD-10-CM | POA: Diagnosis not present

## 2022-06-12 DIAGNOSIS — L89159 Pressure ulcer of sacral region, unspecified stage: Secondary | ICD-10-CM | POA: Diagnosis not present

## 2022-06-12 DIAGNOSIS — K8020 Calculus of gallbladder without cholecystitis without obstruction: Secondary | ICD-10-CM | POA: Diagnosis not present

## 2022-06-12 DIAGNOSIS — C249 Malignant neoplasm of biliary tract, unspecified: Secondary | ICD-10-CM | POA: Diagnosis not present

## 2022-06-12 DIAGNOSIS — K812 Acute cholecystitis with chronic cholecystitis: Secondary | ICD-10-CM | POA: Diagnosis not present

## 2022-06-12 LAB — RESP PANEL BY RT-PCR (FLU A&B, COVID) ARPGX2
Influenza A by PCR: NEGATIVE
Influenza B by PCR: NEGATIVE
SARS Coronavirus 2 by RT PCR: NEGATIVE

## 2022-06-12 LAB — PROTIME-INR
INR: 1.2 (ref 0.8–1.2)
Prothrombin Time: 15.2 seconds (ref 11.4–15.2)

## 2022-06-12 LAB — URINALYSIS, ROUTINE W REFLEX MICROSCOPIC
Bacteria, UA: NONE SEEN
Bilirubin Urine: NEGATIVE
Glucose, UA: NEGATIVE mg/dL
Ketones, ur: NEGATIVE mg/dL
Leukocytes,Ua: NEGATIVE
Nitrite: NEGATIVE
Protein, ur: 30 mg/dL — AB
Specific Gravity, Urine: 1.006 (ref 1.005–1.030)
pH: 7 (ref 5.0–8.0)

## 2022-06-12 LAB — APTT: aPTT: 37 seconds — ABNORMAL HIGH (ref 24–36)

## 2022-06-12 LAB — I-STAT VENOUS BLOOD GAS, ED
Acid-base deficit: 6 mmol/L — ABNORMAL HIGH (ref 0.0–2.0)
Bicarbonate: 18 mmol/L — ABNORMAL LOW (ref 20.0–28.0)
Calcium, Ion: 1.11 mmol/L — ABNORMAL LOW (ref 1.15–1.40)
HCT: 41 % (ref 36.0–46.0)
Hemoglobin: 13.9 g/dL (ref 12.0–15.0)
O2 Saturation: 100 %
Potassium: 5.5 mmol/L — ABNORMAL HIGH (ref 3.5–5.1)
Sodium: 127 mmol/L — ABNORMAL LOW (ref 135–145)
TCO2: 19 mmol/L — ABNORMAL LOW (ref 22–32)
pCO2, Ven: 32.4 mmHg — ABNORMAL LOW (ref 44–60)
pH, Ven: 7.354 (ref 7.25–7.43)
pO2, Ven: 198 mmHg — ABNORMAL HIGH (ref 32–45)

## 2022-06-12 LAB — AMMONIA: Ammonia: 40 umol/L — ABNORMAL HIGH (ref 9–35)

## 2022-06-12 LAB — COMPREHENSIVE METABOLIC PANEL
ALT: 10 U/L (ref 0–44)
AST: 16 U/L (ref 15–41)
Albumin: 3 g/dL — ABNORMAL LOW (ref 3.5–5.0)
Alkaline Phosphatase: 56 U/L (ref 38–126)
Anion gap: 19 — ABNORMAL HIGH (ref 5–15)
BUN: 91 mg/dL — ABNORMAL HIGH (ref 8–23)
CO2: 16 mmol/L — ABNORMAL LOW (ref 22–32)
Calcium: 9.3 mg/dL (ref 8.9–10.3)
Chloride: 94 mmol/L — ABNORMAL LOW (ref 98–111)
Creatinine, Ser: 11.73 mg/dL — ABNORMAL HIGH (ref 0.44–1.00)
GFR, Estimated: 3 mL/min — ABNORMAL LOW (ref >60)
Glucose, Bld: 90 mg/dL (ref 70–99)
Potassium: 5.6 mmol/L — ABNORMAL HIGH (ref 3.5–5.1)
Sodium: 129 mmol/L — ABNORMAL LOW (ref 135–145)
Total Bilirubin: 1.1 mg/dL (ref 0.3–1.2)
Total Protein: 7 g/dL (ref 6.5–8.1)

## 2022-06-12 LAB — CBC WITH DIFFERENTIAL/PLATELET
Abs Immature Granulocytes: 0.04 10*3/uL (ref 0.00–0.07)
Basophils Absolute: 0 10*3/uL (ref 0.0–0.1)
Basophils Relative: 0 %
Eosinophils Absolute: 0 10*3/uL (ref 0.0–0.5)
Eosinophils Relative: 1 %
HCT: 37.5 % (ref 36.0–46.0)
Hemoglobin: 12.7 g/dL (ref 12.0–15.0)
Immature Granulocytes: 1 %
Lymphocytes Relative: 4 %
Lymphs Abs: 0.3 10*3/uL — ABNORMAL LOW (ref 0.7–4.0)
MCH: 32.5 pg (ref 26.0–34.0)
MCHC: 33.9 g/dL (ref 30.0–36.0)
MCV: 95.9 fL (ref 80.0–100.0)
Monocytes Absolute: 0.6 10*3/uL (ref 0.1–1.0)
Monocytes Relative: 7 %
Neutro Abs: 7.6 10*3/uL (ref 1.7–7.7)
Neutrophils Relative %: 87 %
Platelets: 157 10*3/uL (ref 150–400)
RBC: 3.91 MIL/uL (ref 3.87–5.11)
RDW: 18.5 % — ABNORMAL HIGH (ref 11.5–15.5)
WBC: 8.6 10*3/uL (ref 4.0–10.5)
nRBC: 0 % (ref 0.0–0.2)

## 2022-06-12 LAB — TROPONIN I (HIGH SENSITIVITY)
Troponin I (High Sensitivity): 19 ng/L — ABNORMAL HIGH (ref <18)
Troponin I (High Sensitivity): 22 ng/L — ABNORMAL HIGH (ref <18)

## 2022-06-12 LAB — I-STAT CHEM 8, ED
BUN: 80 mg/dL — ABNORMAL HIGH (ref 8–23)
Calcium, Ion: 1.1 mmol/L — ABNORMAL LOW (ref 1.15–1.40)
Chloride: 99 mmol/L (ref 98–111)
Creatinine, Ser: 13.6 mg/dL — ABNORMAL HIGH (ref 0.44–1.00)
Glucose, Bld: 83 mg/dL (ref 70–99)
HCT: 42 % (ref 36.0–46.0)
Hemoglobin: 14.3 g/dL (ref 12.0–15.0)
Potassium: 5.4 mmol/L — ABNORMAL HIGH (ref 3.5–5.1)
Sodium: 128 mmol/L — ABNORMAL LOW (ref 135–145)
TCO2: 18 mmol/L — ABNORMAL LOW (ref 22–32)

## 2022-06-12 LAB — LIPASE, BLOOD: Lipase: 52 U/L — ABNORMAL HIGH (ref 11–51)

## 2022-06-12 LAB — CBG MONITORING, ED: Glucose-Capillary: 100 mg/dL — ABNORMAL HIGH (ref 70–99)

## 2022-06-12 LAB — LACTIC ACID, PLASMA
Lactic Acid, Venous: 1 mmol/L (ref 0.5–1.9)
Lactic Acid, Venous: 1.5 mmol/L (ref 0.5–1.9)

## 2022-06-12 LAB — BRAIN NATRIURETIC PEPTIDE: B Natriuretic Peptide: 814.2 pg/mL — ABNORMAL HIGH (ref 0.0–100.0)

## 2022-06-12 MED ORDER — APIXABAN 2.5 MG PO TABS
2.5000 mg | ORAL_TABLET | Freq: Two times a day (BID) | ORAL | Status: DC
Start: 2022-06-12 — End: 2022-06-13
  Administered 2022-06-13 (×2): 2.5 mg via ORAL
  Filled 2022-06-12 (×3): qty 1

## 2022-06-12 MED ORDER — PANTOPRAZOLE SODIUM 40 MG PO TBEC
40.0000 mg | DELAYED_RELEASE_TABLET | Freq: Two times a day (BID) | ORAL | Status: DC
  Administered 2022-06-13 – 2022-06-28 (×29): 40 mg via ORAL
  Filled 2022-06-12 (×30): qty 1

## 2022-06-12 MED ORDER — IOHEXOL 300 MG/ML  SOLN
100.0000 mL | Freq: Once | INTRAMUSCULAR | Status: AC | PRN
  Administered 2022-06-12: 100 mL via INTRAVENOUS

## 2022-06-12 MED ORDER — SODIUM ZIRCONIUM CYCLOSILICATE 10 G PO PACK
10.0000 g | PACK | Freq: Three times a day (TID) | ORAL | Status: AC
  Administered 2022-06-13 (×2): 10 g via ORAL
  Filled 2022-06-12 (×2): qty 1

## 2022-06-12 MED ORDER — SEVELAMER CARBONATE 800 MG PO TABS
1600.0000 mg | ORAL_TABLET | Freq: Three times a day (TID) | ORAL | Status: DC
Start: 2022-06-13 — End: 2022-06-30
  Administered 2022-06-14 – 2022-06-30 (×32): 1600 mg via ORAL
  Filled 2022-06-12 (×34): qty 2

## 2022-06-12 MED ORDER — ACETAMINOPHEN 500 MG PO TABS
1000.0000 mg | ORAL_TABLET | Freq: Four times a day (QID) | ORAL | Status: DC | PRN
Start: 2022-06-12 — End: 2022-06-13

## 2022-06-12 NOTE — ED Provider Notes (Signed)
[facility] Provider Note   CSN: [id number] Arrival date & time: 06/12/22  1442     History  No chief complaint on file.   Diamond [name] is a 71 y.o. female.  HPI Patient presents with weakness abdominal pain.  Has been feeling bad since around Sunday with today being Saturday.  Has had nausea vomiting and diarrhea.  Decreased oral intake.  Went to urgent care and diagnosed with likely gastroenteritis with pain.  Continued symptoms.  Decreased oral intake.  Feels weak.  Reportedly was too weak to get up to go to dialysis.  She is Tuesday Thursday Saturday dialysis and was last on Tuesday and has missed Thursday and today.  Has had some chills.  Reportedly has been more confused.  Patient is here with her son.   Past Medical History:  Diagnosis Date   Anemia    Asthma    Atrial flutter with rapid ventricular response ([place]) 12/25/2021   Chronic kidney disease    dialysis Tues Thurs Sat   Dysrhythmia    PAF 10/2019 in setting of COVID-19 PNA   Gout    Hypertension     Home Medications Prior to Admission medications   Medication Sig Start Date End Date Taking? Authorizing Provider  acetaminophen (TYLENOL) 500 MG tablet Take 1,000 mg by mouth every 6 (six) hours as needed for moderate pain.    Raymondo Band, MD  albuterol (VENTOLIN HFA) 108 (90 Base) MCG/ACT inhaler Inhale 2 puffs into the lungs every 4 (four) hours as needed for wheezing or shortness of breath. 10/08/19   [provider]  allopurinol (ZYLOPRIM) 100 MG tablet Take 100 mg by mouth 2 (two) times daily. 12/03/21   [provider]  apixaban (ELIQUIS) 2.5 MG TABS tablet Take 1 tablet (2.5 mg total) by mouth 2 (two) times daily. 02/06/22   Ghimire, Henreitta Leber, MD  cyclobenzaprine (FLEXERIL) 5 MG tablet Take 2.5 mg by mouth daily as needed for muscle spasms.    [provider]  fluticasone-salmeterol (ADVAIR) 250-50 MCG/ACT AEPB Inhale 1 puff into the lungs  2 (two) times daily. 02/19/22   [provider]  hydrALAZINE (APRESOLINE) 50 MG tablet Take 1 tablet (50 mg total) by mouth 3 (three) times daily. 03/16/22 06/14/22  Freada Bergeron, MD  Lidocaine 4 % PTCH Apply 1 patch topically daily as needed (pain).    Raymondo Band, MD  metoprolol tartrate (LOPRESSOR) 50 MG tablet Take 1 tablet (50 mg total) by mouth 2 (two) times daily. 12/30/21   Ghimire, Henreitta Leber, MD  ondansetron (ZOFRAN-ODT) 4 MG disintegrating tablet Take 1 tablet (4 mg total) by mouth every 8 (eight) hours as needed for nausea or vomiting. 06/08/22   White, Leitha Schuller, NP  oxyCODONE (ROXICODONE) 5 MG immediate release tablet Take 1 tablet (5 mg total) by mouth every 6 (six) hours as needed for severe pain. 03/08/22   Rhyne, Hulen Shouts, PA-C  pantoprazole (PROTONIX) 40 MG tablet Take 1 tablet (40 mg total) by mouth 2 (two) times daily. 01/22/22   Thurnell Lose, MD  sevelamer carbonate (RENVELA) 800 MG tablet Take 2 tablets (1,600 mg total) by mouth 3 (three) times daily with meals. 12/30/21   Ghimire, Henreitta Leber, MD      Allergies    Shellfish allergy    Review of Systems   Review of Systems  Physical Exam Updated Vital Signs BP (!) 164/104   Pulse 87   Temp 100.1  F (37.8 C) (Rectal)   Resp (!) 21   SpO2 96%  Physical Exam Vitals and nursing note reviewed.  HENT:     Head: Normocephalic.  Neck:     Comments: No JVD. Cardiovascular:     Rate and Rhythm: Regular rhythm.  Pulmonary:     Breath sounds: No wheezing or rhonchi.     Comments: Dialysis catheter right chest wall. Abdominal:     Tenderness: There is abdominal tenderness.     Comments: Diffuse abdominal tenderness particular in upper abdomen.  No hernia palpated.  Musculoskeletal:     Right lower leg: No edema.     Left lower leg: No edema.  Skin:    Capillary Refill: Capillary refill takes less than 2 seconds.  Neurological:     Mental Status: She is alert and oriented to person, place, and  time.     ED Results / Procedures / Treatments   Labs (all labs ordered are listed, but only abnormal results are displayed) Labs Reviewed  COMPREHENSIVE METABOLIC PANEL - Abnormal; Notable for the following components:      Result Value   Sodium 129 (*)    Potassium 5.6 (*)    Chloride 94 (*)    CO2 16 (*)    BUN 91 (*)    Creatinine, Ser 11.73 (*)    Albumin 3.0 (*)    GFR, Estimated 3 (*)    Anion gap 19 (*)    All other components within normal limits  CBC WITH DIFFERENTIAL/PLATELET - Abnormal; Notable for the following components:   RDW 18.5 (*)    Lymphs Abs 0.3 (*)    All other components within normal limits  APTT - Abnormal; Notable for the following components:   aPTT 37 (*)    All other components within normal limits  LIPASE, BLOOD - Abnormal; Notable for the following components:   Lipase 52 (*)    All other components within normal limits  BRAIN NATRIURETIC PEPTIDE - Abnormal; Notable for the following components:   B Natriuretic Peptide 814.2 (*)    All other components within normal limits  AMMONIA - Abnormal; Notable for the following components:   Ammonia 40 (*)    All other components within normal limits  URINALYSIS, ROUTINE W REFLEX MICROSCOPIC - Abnormal; Notable for the following components:   Color, Urine STRAW (*)    Hgb urine dipstick SMALL (*)    Protein, ur 30 (*)    All other components within normal limits  I-STAT CHEM 8, ED - Abnormal; Notable for the following components:   Sodium 128 (*)    Potassium 5.4 (*)    BUN 80 (*)    Creatinine, Ser 13.60 (*)    Calcium, Ion 1.10 (*)    TCO2 18 (*)    All other components within normal limits  I-STAT VENOUS BLOOD GAS, ED - Abnormal; Notable for the following components:   pCO2, Ven 32.4 (*)    pO2, Ven 198 (*)    Bicarbonate 18.0 (*)    TCO2 19 (*)    Acid-base deficit 6.0 (*)    Sodium 127 (*)    Potassium 5.5 (*)    Calcium, Ion 1.11 (*)    All other components within normal limits   CBG MONITORING, ED - Abnormal; Notable for the following components:   Glucose-Capillary 100 (*)    All other components within normal limits  TROPONIN I (HIGH SENSITIVITY) - Abnormal; Notable for the following components:  Troponin I (High Sensitivity) 19 (*)    All other components within normal limits  TROPONIN I (HIGH SENSITIVITY) - Abnormal; Notable for the following components:   Troponin I (High Sensitivity) 22 (*)    All other components within normal limits  RESP PANEL BY RT-PCR (FLU A&B, COVID) ARPGX2  CULTURE, BLOOD (ROUTINE X 2)  CULTURE, BLOOD (ROUTINE X 2)  LACTIC ACID, PLASMA  LACTIC ACID, PLASMA  PROTIME-INR    EKG EKG Interpretation  Date/Time:  Saturday June 12 2022 14:50:28 EDT Ventricular Rate:  76 PR Interval:  168 QRS Duration: 128 QT Interval:  402 QTC Calculation: 452 R Axis:   -2 Text Interpretation: Normal sinus rhythm Right bundle branch block Abnormal ECG When compared with ECG of 05-Feb-2022 19:11, No significant change since last tracing Confirmed by Davonna Belling [phone]) on 06/12/2022 3:47:18 PM  Radiology CT ABDOMEN PELVIS W CONTRAST  Result Date: 06/12/2022 CLINICAL DATA:  Abdominal pain and diarrhea for 2 days EXAM: CT ABDOMEN AND PELVIS WITH CONTRAST TECHNIQUE: Multidetector CT imaging of the abdomen and pelvis was performed using the standard protocol following bolus administration of intravenous contrast. RADIATION DOSE REDUCTION: This exam was performed according to the departmental dose-optimization program which includes automated exposure control, adjustment of the mA and/or kV according to patient size and/or use of iterative reconstruction technique. CONTRAST:  185m OMNIPAQUE IOHEXOL 300 MG/ML  SOLN COMPARISON:  01/16/2022 CT, MRI from 01/17/2022 FINDINGS: Lower chest: No acute abnormality. Hepatobiliary: Liver is well visualized without focal mass lesion. Gallbladder is well distended. Known cholelithiasis is seen and stable. Wall  thickening is noted similar to that seen as well. An enhancing mass is again seen in the region of the fundus measuring up to 2.8 cm again suspicious for gallbladder carcinoma. No biliary ductal dilatation is seen. Pancreas: Unremarkable. No pancreatic ductal dilatation or surrounding inflammatory changes. Spleen: Calcified granulomas are noted.  No mass lesion is seen. Adrenals/Urinary Tract: Adrenal glands are within normal limits. Kidneys demonstrate a normal enhancement pattern. Scattered cysts are again identified and stable. No follow-up is recommended. Cortical calcifications as well as nonobstructing stones are noted on the right. No obstructive changes are noted. The bladder is decompressed with wall thickening identified Stomach/Bowel: The appendix is well visualized and within normal limits. No obstructive or inflammatory changes of the colon are seen. Small bowel shows no obstructive changes. Stomach is fluid-filled. Vascular/Lymphatic: Aortic atherosclerosis. No enlarged abdominal or pelvic lymph nodes. Reproductive: Uterus and bilateral adnexa are unremarkable. Other: No abdominal wall hernia or abnormality. No abdominopelvic ascites. Musculoskeletal: Degenerative changes of lumbar spine are noted worst at L4-5 with endplate sclerosis and disc space widening likely related to prior discitis. The overall appearance is stable from the prior study. IMPRESSION: Cholelithiasis with wall thickening in the gallbladder as well as a gallbladder fundal mass suspicious for carcinoma. These changes are stable from prior MR and CT. Nonobstructing renal calculi on the right. Chronic changes at L4-L5 likely related to prior discitis. Electronically Signed   By: MInez CatalinaM.D.   On: 06/12/2022 22:51   CT Head Wo Contrast  Result Date: 06/12/2022 CLINICAL DATA:  Altered mental status EXAM: CT HEAD WITHOUT CONTRAST TECHNIQUE: Contiguous axial images were obtained from the base of the skull through the vertex  without intravenous contrast. RADIATION DOSE REDUCTION: This exam was performed according to the departmental dose-optimization program which includes automated exposure control, adjustment of the mA and/or kV according to patient size and/or use of iterative reconstruction technique. COMPARISON:  None Available. FINDINGS: Brain: No evidence of acute infarction, hemorrhage, hydrocephalus, extra-axial collection or mass lesion/mass effect. Chronic atrophic and ischemic changes are noted. Vascular: No hyperdense vessel or unexpected calcification. Skull: Normal. Negative for fracture or focal lesion. Sinuses/Orbits: No acute finding. Other: None. IMPRESSION: Chronic atrophic and ischemic changes without acute abnormality. Electronically Signed   By: Inez Catalina M.D.   On: 06/12/2022 22:45   DG Chest 1 View  Result Date: 06/12/2022 CLINICAL DATA:  Questionable sepsis EXAM: CHEST  1 VIEW COMPARISON:  Radiograph 03/09/2022 FINDINGS: Right neck catheter tip overlies the superior cavoatrial junction. Unchanged cardiomediastinal silhouette. There is no focal airspace consolidation. There is no pleural effusion. There is no pneumothorax. There is no acute osseous abnormality. Skin fold overlies the left lower chest. IMPRESSION: No evidence of acute cardiopulmonary disease. Electronically Signed   By: Maurine Simmering M.D.   On: 06/12/2022 16:25    Procedures Procedures    Medications Ordered in ED Medications  iohexol (OMNIPAQUE) 300 MG/ML solution 100 mL (100 mLs Intravenous Contrast Given 06/12/22 2234)    ED Course/ Medical Decision Making/ A&P                           Medical Decision Making Amount and/or Complexity of Data Reviewed Labs: ordered.   Patient presents with generalized weakness.  Has had nausea vomiting diarrhea.  Initial thought by family was may have had some food poisoning.  However has had decreased oral intake and unable to go to dialysis.  EKG reviewed and reassuring.  Doubt severe  hyperkalemia.  Does have some risk for both hyper and hypovolemia.  Has not had dialysis over the last 2 times which would increase her risk of being overloaded but also has had decreased oral intake which would make her volume depleted.  Does not have peripheral edema.  No JVD on my exam and lungs clear.  Will check x-rays blood work and CT scan due to abdominal tenderness.  We will also get rectal temperature due to oral temperature is 100.4  Mild hyperkalemia on lab work.  Creatinine elevated consistent with her end-stage renal disease and noncompliance with dialysis.  CT scan done and shows gallbladder mass and thickening.  Likely malignancy.  Stable from previous imaging.  Patient does not really know anything about this 1 discussed specifically.  Troponin is mildly elevated but I think this most likely secondary to end-stage renal disease. With vomiting and 2 episodes of missed dialysis I feel the patient benefit from mission to the hospital.  Will discuss with hospitalist.  Has seen [facility] in the past.  From what I can tell she has not followed up with surgery as planned.         Final Clinical Impression(s) / ED Diagnoses Final diagnoses:  Generalized abdominal pain  End stage renal disease on dialysis [facility])    Rx / DC Orders ED Discharge Orders     None         Davonna Belling, MD 06/12/22 2314

## 2022-06-12 NOTE — H&P (Incomplete)
History and Physical  Diamond [name] VCB:[id number] DOB: [birth year] DOA: 06/12/2022  Referring physician: Dr. Alvino Chapel, [place]. PCP: [name], MD  Outpatient Specialists: GI. Patient coming from: Home  Chief Complaint: Abdominal pain with vomiting and x2 days.  Missed hemodialysis.  HPI: Diamond [name] is a 71 y.o. female with medical history significant for atrial flutter on Eliquis, right atrial mass, ESRD on HD TTS, anemia of chronic disease, history of bacteremia, history of bacterial endocarditis, essential hypertension, who presented to [facility] ED from home with complaints of generalized abdominal pain.  Associated with nausea vomiting and diarrhea of 2 days duration.  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  TR[facility], hospitalist service, was asked to admit.  At the time of this visit, she is hypersomnolent but arousable to voices, oriented x1.  ED Course: Tmax 100.4.  BP 170/99, pulse 90, respiratory 16, saturation 100% on room air.  CBC essentially unremarkable.  Serum sodium 127, potassium 5.5, BUN 80, creatinine 13.6.  Troponin 22 from 19.  Lactic acid 1.5 from 1.0.  Review of Systems: Review of systems as noted in the HPI. All other systems reviewed and are negative.   Past Medical History:  Diagnosis Date   Anemia    Asthma    Atrial flutter with rapid ventricular response ([place]) 12/25/2021   Chronic kidney disease    dialysis Tues Thurs Sat   Dysrhythmia    PAF 10/2019 in setting of COVID-19 PNA   Gout    Hypertension    Past Surgical History:  Procedure Laterality Date   AV FISTULA PLACEMENT Left 12/23/2021   Procedure: LEFT ARM BRACHIOBASILIC VEIN ARTERIOVENOUS (AV) FISTULA CREATION;  Surgeon: [name], MD;  Location: [place];  Service: Vascular;  Laterality: Left;   [place] Left 03/08/2022   Procedure: LEFT SECOND STAGE BASILIC VEIN TRANSPOSITION;  Surgeon:  [name], MD;  Location: [facility] OR;  Service: Vascular;  Laterality: Left;   BIOPSY  01/19/2022   Procedure: BIOPSY;  Surgeon: Lavena Bullion, DO;  Location: [place] ENDOSCOPY;  Service: Gastroenterology;;   [place]   COLONOSCOPY Left 01/19/2022   Procedure: COLONOSCOPY;  Surgeon: Lavena Bullion, DO;  Location: [place];  Service: Gastroenterology;  Laterality: Left;   ESOPHAGOGASTRODUODENOSCOPY Left 01/19/2022   Procedure: ESOPHAGOGASTRODUODENOSCOPY (EGD);  Surgeon: Lavena Bullion, DO;  Location: [facility] ENDOSCOPY;  Service: Gastroenterology;  Laterality: Left;   INSERTION OF DIALYSIS CATHETER Right 12/23/2021   Procedure: INSERTION OF DIALYSIS CATHETER;  Surgeon: [name], MD;  Location: [place];  Service: Vascular;  Laterality: Right;   POLYPECTOMY  01/19/2022   Procedure: POLYPECTOMY;  Surgeon: Lavena Bullion, DO;  Location: [place] [facility]COPY;  Service: Gastroenterology;;   TEE WITHOUT CARDIOVERSION N/A 12/22/2021   Procedure: TRANSESOPHAGEAL ECHOCARDIOGRAM (TEE);  Surgeon: Jerline Pain, MD;  Location: [facility] ENDOSCOPY;  Service: Cardiovascular;  Laterality: N/A;   ULTRASOUND GUIDANCE FOR VASCULAR ACCESS  12/23/2021   Procedure: ULTRASOUND GUIDANCE FOR VASCULAR ACCESS;  Surgeon: [name], MD;  Location: [place];  Service: Vascular;;    Social History:  reports that she has never smoked. She has never been exposed to tobacco smoke. She has never used smokeless tobacco. She reports that she does not drink alcohol and does not use drugs.   Allergies  Allergen Reactions   Shellfish Allergy     Unknown reaction     Family History  Problem Relation  Age of Onset   Hypertension Mother    Hypertension Father    Colon cancer Brother    Lung cancer Brother       Prior to Admission medications   Medication Sig Start Date End Date Taking? Authorizing Provider  acetaminophen (TYLENOL) 500 MG tablet Take 1,000 mg by mouth every 6 (six) hours as needed for  moderate pain.    Raymondo Band, MD  albuterol (VENTOLIN HFA) 108 (90 Base) MCG/ACT inhaler Inhale 2 puffs into the lungs every 4 (four) hours as needed for wheezing or shortness of breath. 10/08/19   [provider]  allopurinol (ZYLOPRIM) 100 MG tablet Take 100 mg by mouth 2 (two) times daily. 12/03/21   [provider]  apixaban (ELIQUIS) 2.5 MG TABS tablet Take 1 tablet (2.5 mg total) by mouth 2 (two) times daily. 02/06/22   Ghimire, Henreitta Leber, MD  cyclobenzaprine (FLEXERIL) 5 MG tablet Take 2.5 mg by mouth daily as needed for muscle spasms.    [provider]  fluticasone-salmeterol (ADVAIR) 250-50 MCG/ACT AEPB Inhale 1 puff into the lungs 2 (two) times daily. 02/19/22   [provider]  hydrALAZINE (APRESOLINE) 50 MG tablet Take 1 tablet (50 mg total) by mouth 3 (three) times daily. 03/16/22 06/14/22  Freada Bergeron, MD  Lidocaine 4 % PTCH Apply 1 patch topically daily as needed (pain).    Raymondo Band, MD  metoprolol tartrate (LOPRESSOR) 50 MG tablet Take 1 tablet (50 mg total) by mouth 2 (two) times daily. 12/30/21   Ghimire, Henreitta Leber, MD  ondansetron (ZOFRAN-ODT) 4 MG disintegrating tablet Take 1 tablet (4 mg total) by mouth every 8 (eight) hours as needed for nausea or vomiting. 06/08/22   White, Leitha Schuller, NP  oxyCODONE (ROXICODONE) 5 MG immediate release tablet Take 1 tablet (5 mg total) by mouth every 6 (six) hours as needed for severe pain. 03/08/22   Rhyne, Hulen Shouts, PA-C  pantoprazole (PROTONIX) 40 MG tablet Take 1 tablet (40 mg total) by mouth 2 (two) times daily. 01/22/22   Thurnell Lose, MD  sevelamer carbonate (RENVELA) 800 MG tablet Take 2 tablets (1,600 mg total) by mouth 3 (three) times daily with meals. 12/30/21   Ghimire, Henreitta Leber, MD    Physical Exam: BP (!) 170/99 (BP Location: Right Arm)   Pulse 90   Temp 98.3 F (36.8 C) (Oral)   Resp 16   SpO2 100%   General: 71 y.o. year-old female well developed well nourished in no  acute distress.  Alert and oriented x3. Cardiovascular: Regular rate and rhythm with no rubs or gallops.  No thyromegaly or JVD noted.  No lower extremity edema. 2/4 pulses in all 4 extremities. Respiratory: Clear to auscultation with no wheezes or rales. Poor inspiratory effort. Abdomen: Soft nontender nondistended with normal bowel sounds x4 quadrants. Muskuloskeletal: No cyanosis, clubbing or edema noted bilaterally Neuro: CN II-XII intact, strength, sensation, reflexes Skin: No ulcerative lesions noted or rashes Psychiatry: Judgement and insight appear altered. Mood is appropriate for condition and setting          Labs on Admission:  Basic Metabolic Panel: Recent Labs  Lab 06/07/22 2212 06/12/22 1545 06/12/22 1649  NA 140 129* 127*  128*  K 4.5 5.6* 5.5*  5.4*  CL 105 94* 99  CO2 22 16*  --   GLUCOSE 100* 90 83  BUN 50* 91* 80*  CREATININE 7.92* 11.73* 13.60*  CALCIUM 10.3 9.3  --    Liver  Function Tests: Recent Labs  Lab 06/07/22 2212 06/12/22 1545  AST 24 16  ALT 14 10  ALKPHOS 59 56  BILITOT 1.1 1.1  PROT 8.1 7.0  ALBUMIN 4.2 3.0*   Recent Labs  Lab 06/07/22 2212 06/12/22 1545  LIPASE 41 52*   Recent Labs  Lab 06/12/22 1545  AMMONIA 40*   CBC: Recent Labs  Lab 06/07/22 2212 06/12/22 1545 06/12/22 1649  WBC 6.1 8.6  --   NEUTROABS 4.5 7.6  --   HGB 13.0 12.7 13.9  14.3  HCT 40.7 37.5 41.0  42.0  MCV 101.0* 95.9  --   PLT 117* 157  --    Cardiac Enzymes: No results for input(s): "CKTOTAL", "CKMB", "CKMBINDEX", "TROPONINI" in the last 168 hours.  BNP (last 3 results) Recent Labs    01/20/22 0458 01/21/22 0228 06/12/22 1545  BNP 943.3* 760.8* 814.2*    ProBNP (last 3 results) No results for input(s): "PROBNP" in the last 8760 hours.  CBG: Recent Labs  Lab 06/12/22 1631  GLUCAP 100*    Radiological Exams on Admission: CT ABDOMEN PELVIS W CONTRAST  Result Date: 06/12/2022 CLINICAL DATA:  Abdominal pain and diarrhea for 2  days EXAM: CT ABDOMEN AND PELVIS WITH CONTRAST TECHNIQUE: Multidetector CT imaging of the abdomen and pelvis was performed using the standard protocol following bolus administration of intravenous contrast. RADIATION DOSE REDUCTION: This exam was performed according to the departmental dose-optimization program which includes automated exposure control, adjustment of the mA and/or kV according to patient size and/or use of iterative reconstruction technique. CONTRAST:  162m OMNIPAQUE IOHEXOL 300 MG/ML  SOLN COMPARISON:  01/16/2022 CT, MRI from 01/17/2022 FINDINGS: Lower chest: No acute abnormality. Hepatobiliary: Liver is well visualized without focal mass lesion. Gallbladder is well distended. Known cholelithiasis is seen and stable. Wall thickening is noted similar to that seen as well. An enhancing mass is again seen in the region of the fundus measuring up to 2.8 cm again suspicious for gallbladder carcinoma. No biliary ductal dilatation is seen. Pancreas: Unremarkable. No pancreatic ductal dilatation or surrounding inflammatory changes. Spleen: Calcified granulomas are noted.  No mass lesion is seen. Adrenals/Urinary Tract: Adrenal glands are within normal limits. Kidneys demonstrate a normal enhancement pattern. Scattered cysts are again identified and stable. No follow-up is recommended. Cortical calcifications as well as nonobstructing stones are noted on the right. No obstructive changes are noted. The bladder is decompressed with wall thickening identified Stomach/Bowel: The appendix is well visualized and within normal limits. No obstructive or inflammatory changes of the colon are seen. Small bowel shows no obstructive changes. Stomach is fluid-filled. Vascular/Lymphatic: Aortic atherosclerosis. No enlarged abdominal or pelvic lymph nodes. Reproductive: Uterus and bilateral adnexa are unremarkable. Other: No abdominal wall hernia or abnormality. No abdominopelvic ascites. Musculoskeletal: Degenerative  changes of lumbar spine are noted worst at L4-5 with endplate sclerosis and disc space widening likely related to prior discitis. The overall appearance is stable from the prior study. IMPRESSION: Cholelithiasis with wall thickening in the gallbladder as well as a gallbladder fundal mass suspicious for carcinoma. These changes are stable from prior MR and CT. Nonobstructing renal calculi on the right. Chronic changes at L4-L5 likely related to prior discitis. Electronically Signed   By: MInez CatalinaM.D.   On: 06/12/2022 22:51   CT Head Wo Contrast  Result Date: 06/12/2022 CLINICAL DATA:  Altered mental status EXAM: CT HEAD WITHOUT CONTRAST TECHNIQUE: Contiguous axial images were obtained from the base of the skull through the vertex  without intravenous contrast. RADIATION DOSE REDUCTION: This exam was performed according to the departmental dose-optimization program which includes automated exposure control, adjustment of the mA and/or kV according to patient size and/or use of iterative reconstruction technique. COMPARISON:  None Available. FINDINGS: Brain: No evidence of acute infarction, hemorrhage, hydrocephalus, extra-axial collection or mass lesion/mass effect. Chronic atrophic and ischemic changes are noted. Vascular: No hyperdense vessel or unexpected calcification. Skull: Normal. Negative for fracture or focal lesion. Sinuses/Orbits: No acute finding. Other: None. IMPRESSION: Chronic atrophic and ischemic changes without acute abnormality. Electronically Signed   By: Inez Catalina M.D.   On: 06/12/2022 22:45   DG Chest 1 View  Result Date: 06/12/2022 CLINICAL DATA:  Questionable sepsis EXAM: CHEST  1 VIEW COMPARISON:  Radiograph 03/09/2022 FINDINGS: Right neck catheter tip overlies the superior cavoatrial junction. Unchanged cardiomediastinal silhouette. There is no focal airspace consolidation. There is no pleural effusion. There is no pneumothorax. There is no acute osseous abnormality. Skin fold  overlies the left lower chest. IMPRESSION: No evidence of acute cardiopulmonary disease. Electronically Signed   By: Maurine Simmering M.D.   On: 06/12/2022 16:25    EKG: I independently viewed the EKG done and my findings are as followed: Normal sinus rhythm rate of 76.  Nonspecific ST-T changes.  QTc 452.  Assessment/Plan Present on Admission:  Generalized abdominal pain  Principal Problem:   Generalized abdominal pain  Generalized abdominal pain suspect related to gallbladder mass seen on CT scan Contrast enhanced CT abdomen pelvis showed cholelithiasis with wall thickening in the gallbladder as well as gallbladder fundal mass suspicious for carcinoma Nonobstructing renal calculi on the right Chronic changes at L4-L5 likely related to prior discitis. Pain control and bowel regimen Consult General surgery in the morning  ESRD on HD TTS Missed 2 sessions of hemodialysis Will benefit from hemodialysis prior to next planned session Consulted nephrology, will see in consultation. Volume status and electrolytes managed with hemodialysis.  Hypervolemic hyponatremia likely secondary to missed hemodialysis Presented with serum sodium 129, repeat 127 Nephrology consult for hemodialysis during this admission  Hyperkalemia in the setting of missed hemodialysis Presented with potassium of 5.6, downtrending 5.5 Start Lokelma 10 g 3 times daily x3 doses  Acute metabolic encephalopathy, likely multifactorial UA negative for pyuria. CT head was nonacute, showing, chronic atrophy and ischemic changes without acute abnormality. Reorient as needed Treat underlying conditions  Nonobstructing renal calculi on the right IV analgesics as needed  Paroxysmal A-flutter on Eliquis Rate controlled on home metoprolol, resume Resume home Eliquis for secondary CVA prevention  GERD Resume home regimen    Critical care time: 65 minutes.    DVT prophylaxis: Eliquis.  Code Status: Full code,  presumed, as the patient is unable to provide me her CODE STATUS.  Please update her CODE STATUS is soon as possible.  Family Communication: None at bedside.  Disposition Plan: Admitted to telemetry medical unit  Consults called: Nephrology  Admission status: Inpatient status.   Status is: Inpatient The patient requires at least 2 midnights for further evaluation and treatment of present condition.   Kayleen Memos MD [facility] Pager [phone]  If 7PM-7AM, please contact night-coverage [email or web address] Password [facility]  06/12/2022, 11:36 PM

## 2022-06-12 NOTE — ED Triage Notes (Signed)
Patient complains of abdominal pain with vomiting and diarrhea x 2 days, last dialysis Tuesday and states too sick to go Thursday. Patient alert but slow to respond to questions

## 2022-06-12 NOTE — ED Provider Triage Note (Signed)
Emergency Medicine Provider Triage Evaluation Note  Diamond [name] , a 71 y.o. female  was evaluated in triage.  Pt complains of patient has a history of end-stage renal disease on hemodialysis Tuesday Thursday Friday.  Last dialysis Tuesday.  Patient is clearly altered.  She is complaining of abdominal pain nausea and vomiting.  She feels short of breath.  The history is unreliable as patient is clearly confused.  Elevated temperature of 100.4 noted with abdominal tenderness.  Jugular venous distention present.  Review of Systems  Positive: Altered mental status Negative:  Physical Exam  BP 124/80 (BP Location: Right Arm)   Pulse 79   Temp (!) 100.4 F (38 C) (Oral)   Resp 14   SpO2 99%  Gen:   Awake, no distress   Resp:  Increased respiratory effort, JVD MSK:   Moves extremities without difficulty  Other:  Nominal distention and tenderness, volume overload  Medical Decision Making  Medically screening exam initiated at 3:08 PM.  Appropriate orders placed.  Diamond [name] was informed that the remainder of the evaluation will be completed by another provider, this initial triage assessment does not replace that evaluation, and the importance of remaining in the ED until their evaluation is complete.  Sepsis work-up initiated.  Altered.   Margarita Mail, PA-C 06/12/22 [phone]

## 2022-06-13 DIAGNOSIS — R7881 Bacteremia: Secondary | ICD-10-CM | POA: Diagnosis not present

## 2022-06-13 DIAGNOSIS — I10 Essential (primary) hypertension: Secondary | ICD-10-CM

## 2022-06-13 DIAGNOSIS — K828 Other specified diseases of gallbladder: Secondary | ICD-10-CM

## 2022-06-13 DIAGNOSIS — G9341 Metabolic encephalopathy: Secondary | ICD-10-CM

## 2022-06-13 DIAGNOSIS — A419 Sepsis, unspecified organism: Secondary | ICD-10-CM | POA: Diagnosis not present

## 2022-06-13 DIAGNOSIS — A4102 Sepsis due to Methicillin resistant Staphylococcus aureus: Secondary | ICD-10-CM

## 2022-06-13 DIAGNOSIS — I4892 Unspecified atrial flutter: Secondary | ICD-10-CM | POA: Diagnosis present

## 2022-06-13 DIAGNOSIS — R6520 Severe sepsis without septic shock: Secondary | ICD-10-CM

## 2022-06-13 DIAGNOSIS — N186 End stage renal disease: Secondary | ICD-10-CM | POA: Diagnosis not present

## 2022-06-13 DIAGNOSIS — B9562 Methicillin resistant Staphylococcus aureus infection as the cause of diseases classified elsewhere: Secondary | ICD-10-CM

## 2022-06-13 DIAGNOSIS — Z992 Dependence on renal dialysis: Secondary | ICD-10-CM

## 2022-06-13 LAB — CBC WITH DIFFERENTIAL/PLATELET
Abs Immature Granulocytes: 0.03 10*3/uL (ref 0.00–0.07)
Basophils Absolute: 0 10*3/uL (ref 0.0–0.1)
Basophils Relative: 0 %
Eosinophils Absolute: 0 10*3/uL (ref 0.0–0.5)
Eosinophils Relative: 1 %
HCT: 34.7 % — ABNORMAL LOW (ref 36.0–46.0)
Hemoglobin: 11.2 g/dL — ABNORMAL LOW (ref 12.0–15.0)
Immature Granulocytes: 0 %
Lymphocytes Relative: 7 %
Lymphs Abs: 0.5 10*3/uL — ABNORMAL LOW (ref 0.7–4.0)
MCH: 31.8 pg (ref 26.0–34.0)
MCHC: 32.3 g/dL (ref 30.0–36.0)
MCV: 98.6 fL (ref 80.0–100.0)
Monocytes Absolute: 0.6 10*3/uL (ref 0.1–1.0)
Monocytes Relative: 8 %
Neutro Abs: 6.3 10*3/uL (ref 1.7–7.7)
Neutrophils Relative %: 84 %
Platelets: 136 10*3/uL — ABNORMAL LOW (ref 150–400)
RBC: 3.52 MIL/uL — ABNORMAL LOW (ref 3.87–5.11)
RDW: 18.5 % — ABNORMAL HIGH (ref 11.5–15.5)
WBC: 7.5 10*3/uL (ref 4.0–10.5)
nRBC: 0 % (ref 0.0–0.2)

## 2022-06-13 LAB — RENAL FUNCTION PANEL
Albumin: 2.6 g/dL — ABNORMAL LOW (ref 3.5–5.0)
Anion gap: 18 — ABNORMAL HIGH (ref 5–15)
BUN: 94 mg/dL — ABNORMAL HIGH (ref 8–23)
CO2: 14 mmol/L — ABNORMAL LOW (ref 22–32)
Calcium: 9 mg/dL (ref 8.9–10.3)
Chloride: 97 mmol/L — ABNORMAL LOW (ref 98–111)
Creatinine, Ser: 12.08 mg/dL — ABNORMAL HIGH (ref 0.44–1.00)
GFR, Estimated: 3 mL/min — ABNORMAL LOW (ref >60)
Glucose, Bld: 70 mg/dL (ref 70–99)
Phosphorus: 8.9 mg/dL — ABNORMAL HIGH (ref 2.5–4.6)
Potassium: 5 mmol/L (ref 3.5–5.1)
Sodium: 129 mmol/L — ABNORMAL LOW (ref 135–145)

## 2022-06-13 LAB — BLOOD CULTURE ID PANEL (REFLEXED) - BCID2

## 2022-06-13 LAB — HEPATITIS B SURFACE ANTIBODY,QUALITATIVE: Hep B S Ab: NONREACTIVE

## 2022-06-13 LAB — HEPATITIS B SURFACE ANTIGEN: Hepatitis B Surface Ag: NONREACTIVE

## 2022-06-13 LAB — HEPATITIS B CORE ANTIBODY, TOTAL: Hep B Core Total Ab: REACTIVE — AB

## 2022-06-13 LAB — HEPATITIS C ANTIBODY: HCV Ab: REACTIVE — AB

## 2022-06-13 MED ORDER — SODIUM CHLORIDE 0.9 % IV SOLN
2.0000 g | INTRAVENOUS | Status: DC
  Administered 2022-06-13: 2 g via INTRAVENOUS
  Filled 2022-06-13: qty 20

## 2022-06-13 MED ORDER — PROCHLORPERAZINE EDISYLATE 10 MG/2ML IJ SOLN
10.0000 mg | Freq: Four times a day (QID) | INTRAMUSCULAR | Status: DC | PRN
Start: 2022-06-13 — End: 2022-06-30
  Administered 2022-06-27: 10 mg via INTRAVENOUS
  Filled 2022-06-13: qty 2

## 2022-06-13 MED ORDER — METOPROLOL TARTRATE 25 MG PO TABS
25.0000 mg | ORAL_TABLET | Freq: Two times a day (BID) | ORAL | Status: DC
  Administered 2022-06-13: 25 mg via ORAL
  Filled 2022-06-13 (×2): qty 1

## 2022-06-13 MED ORDER — VANCOMYCIN HCL 1250 MG/250ML IV SOLN
1250.0000 mg | Freq: Once | INTRAVENOUS | Status: AC
  Administered 2022-06-13: 1250 mg via INTRAVENOUS
  Filled 2022-06-13: qty 250

## 2022-06-13 MED ORDER — ACETAMINOPHEN 325 MG PO TABS
650.0000 mg | ORAL_TABLET | Freq: Four times a day (QID) | ORAL | Status: DC | PRN
  Filled 2022-06-13: qty 2

## 2022-06-13 MED ORDER — SODIUM CHLORIDE 0.9 % IV BOLUS
250.0000 mL | Freq: Once | INTRAVENOUS | Status: AC
  Administered 2022-06-13: 250 mL via INTRAVENOUS

## 2022-06-13 MED ORDER — METOPROLOL TARTRATE 5 MG/5ML IV SOLN
5.0000 mg | Freq: Once | INTRAVENOUS | Status: AC
  Administered 2022-06-13: 5 mg via INTRAVENOUS

## 2022-06-13 MED ORDER — HYDRALAZINE HCL 20 MG/ML IJ SOLN
10.0000 mg | Freq: Four times a day (QID) | INTRAMUSCULAR | Status: DC | PRN
Start: 2022-06-13 — End: 2022-06-18
  Administered 2022-06-13: 10 mg via INTRAVENOUS
  Filled 2022-06-13: qty 1

## 2022-06-13 MED ORDER — VANCOMYCIN VARIABLE DOSE PER UNSTABLE RENAL FUNCTION (PHARMACIST DOSING): Status: DC

## 2022-06-13 MED ORDER — HYDROMORPHONE HCL 1 MG/ML IJ SOLN
0.5000 mg | INTRAMUSCULAR | Status: DC | PRN
  Administered 2022-06-21 – 2022-06-25 (×3): 0.5 mg via INTRAVENOUS
  Filled 2022-06-13 (×3): qty 0.5

## 2022-06-13 MED ORDER — METOPROLOL TARTRATE 5 MG/5ML IV SOLN
5.0000 mg | Freq: Once | INTRAVENOUS | Status: AC
Start: 2022-06-13 — End: 2022-06-13
  Administered 2022-06-13: 5 mg via INTRAVENOUS
  Filled 2022-06-13: qty 5

## 2022-06-13 MED ORDER — METOPROLOL TARTRATE 5 MG/5ML IV SOLN
INTRAVENOUS | Status: AC
  Filled 2022-06-13: qty 5

## 2022-06-13 MED ORDER — OXYCODONE HCL 5 MG PO TABS
5.0000 mg | ORAL_TABLET | Freq: Four times a day (QID) | ORAL | Status: DC | PRN
  Administered 2022-06-13 – 2022-06-24 (×15): 5 mg via ORAL
  Filled 2022-06-13 (×15): qty 1

## 2022-06-13 MED ORDER — ACETAMINOPHEN 650 MG RE SUPP
650.0000 mg | Freq: Four times a day (QID) | RECTAL | Status: DC | PRN
  Administered 2022-06-13: 650 mg via RECTAL
  Filled 2022-06-13: qty 1

## 2022-06-13 MED ORDER — CHLORHEXIDINE GLUCONATE CLOTH 2 % EX PADS
6.0000 | MEDICATED_PAD | Freq: Every day | CUTANEOUS | Status: DC
Start: 2022-06-14 — End: 2022-06-16
  Administered 2022-06-14 – 2022-06-16 (×3): 6 via TOPICAL

## 2022-06-13 NOTE — Consult Note (Addendum)
[facility] Renal Consultation Note    Indication for Consultation:  Management of ESRD/hemodialysis; anemia, hypertension/volume and secondary hyperparathyroidism  HPI: Diamond [name] is a 71 y.o. female with ESRD on HD, HTN, atrial flutter, history of MRSA endocarditis. She was admitted for evaluation of suspicious gallbladder mass and AMS. Presented to the ED with    abdominal pain, nausea/vomiting ongoing for the past week. Had been seen in the ED twice this week  with symptoms and has missed her last 2 dialysis treatments d/t feeling bad.  CT Abdomen showing gallbladder mass suspicious for carcinoma. No acute changes on head CT. Labs Na 129, K 5.0, CO2 14, BUN 94 Cr 12, Phos 8.9.  Blood cultures now +MRSA.   Seen and examined in the ED. She is oriented to self, but unable to give much history. Keeps saying "are you waiting for an answer?"  Dialysis TTS at [facility]. Last dialysis was Saturday 7/1. She has a tunneled dialysis cathter and AV fistula. She states they have been using the fistula at dialysis.   Past Medical History:  Diagnosis Date   Anemia    Asthma    Atrial flutter with rapid ventricular response ([place]) 12/25/2021   Chronic kidney disease    dialysis Tues Thurs Sat   Dysrhythmia    PAF 10/2019 in setting of COVID-19 PNA   Gout    Hypertension    Past Surgical History:  Procedure Laterality Date   AV FISTULA PLACEMENT Left 12/23/2021   Procedure: LEFT ARM BRACHIOBASILIC VEIN ARTERIOVENOUS (AV) FISTULA CREATION;  Surgeon: [name], MD;  Location: [place];  Service: Vascular;  Laterality: Left;   [place] Left 03/08/2022   Procedure: LEFT SECOND STAGE BASILIC VEIN TRANSPOSITION;  Surgeon: [name], MD;  Location: [facility] OR;  Service: Vascular;  Laterality: Left;   BIOPSY  01/19/2022   Procedure: BIOPSY;  Surgeon: Lavena Bullion, DO;  Location: [place] ENDOSCOPY;  Service: Gastroenterology;;   [place]   COLONOSCOPY Left 01/19/2022   Procedure: COLONOSCOPY;  Surgeon: Lavena Bullion, DO;  Location: [place];  Service: Gastroenterology;  Laterality: Left;   ESOPHAGOGASTRODUODENOSCOPY Left 01/19/2022   Procedure: ESOPHAGOGASTRODUODENOSCOPY (EGD);  Surgeon: Lavena Bullion, DO;  Location: [facility] ENDOSCOPY;  Service: Gastroenterology;  Laterality: Left;   INSERTION OF DIALYSIS CATHETER Right 12/23/2021   Procedure: INSERTION OF DIALYSIS CATHETER;  Surgeon: [name], MD;  Location: [place];  Service: Vascular;  Laterality: Right;   POLYPECTOMY  01/19/2022   Procedure: POLYPECTOMY;  Surgeon: Lavena Bullion, DO;  Location: [place] ENDOSCOPY;  Service: Gastroenterology;;   TEE WITHOUT CARDIOVERSION N/A 12/22/2021   Procedure: TRANSESOPHAGEAL ECHOCARDIOGRAM (TEE);  Surgeon: Jerline Pain, MD;  Location: [facility] ENDOSCOPY;  Service: Cardiovascular;  Laterality: N/A;   ULTRASOUND GUIDANCE FOR VASCULAR ACCESS  12/23/2021   Procedure: ULTRASOUND GUIDANCE FOR VASCULAR ACCESS;  Surgeon: [name], MD;  Location: [facility] OR;  Service: Vascular;;   Family History  Problem Relation Age of Onset   Hypertension Mother    Hypertension Father    Colon cancer Brother    Lung cancer Brother    Social History:  reports that she has never smoked. She has never been exposed to tobacco smoke. She has never used smokeless tobacco. She reports that she does not drink alcohol and does not use drugs. Allergies  Allergen Reactions   Shellfish Allergy     Unknown reaction    Prior to Admission  medications   Medication Sig Start Date End Date Taking? Authorizing Provider  acetaminophen (TYLENOL) 500 MG tablet Take 1,000 mg by mouth every 6 (six) hours as needed for moderate pain.   Yes Raymondo Band, MD  albuterol (VENTOLIN HFA) 108 (90 Base) MCG/ACT inhaler Inhale 2 puffs into the lungs every 4 (four) hours as needed for wheezing or shortness of breath. 10/08/19  Yes [provider]  allopurinol  (ZYLOPRIM) 100 MG tablet Take 100 mg by mouth 2 (two) times daily. 12/03/21  Yes [provider]  apixaban (ELIQUIS) 2.5 MG TABS tablet Take 1 tablet (2.5 mg total) by mouth 2 (two) times daily. 02/06/22  Yes Ghimire, Henreitta Leber, MD  cyclobenzaprine (FLEXERIL) 5 MG tablet Take 2.5 mg by mouth daily as needed for muscle spasms.   Yes [provider]  fluticasone-salmeterol (ADVAIR) 250-50 MCG/ACT AEPB Inhale 1 puff into the lungs 2 (two) times daily. 02/19/22  Yes [provider]  hydrALAZINE (APRESOLINE) 50 MG tablet Take 1 tablet (50 mg total) by mouth 3 (three) times daily. 03/16/22 06/14/22 Yes Freada Bergeron, MD  Lidocaine 4 % PTCH Apply 1 patch topically daily as needed (pain).   Yes Jules Husbands A, MD  metoprolol tartrate (LOPRESSOR) 50 MG tablet Take 1 tablet (50 mg total) by mouth 2 (two) times daily. 12/30/21  Yes Ghimire, Henreitta Leber, MD  ondansetron (ZOFRAN-ODT) 4 MG disintegrating tablet Take 1 tablet (4 mg total) by mouth every 8 (eight) hours as needed for nausea or vomiting. 06/08/22  Yes White, Adrienne R, NP  oxyCODONE (ROXICODONE) 5 MG immediate release tablet Take 1 tablet (5 mg total) by mouth every 6 (six) hours as needed for severe pain. 03/08/22  Yes Rhyne, Samantha J, PA-C  pantoprazole (PROTONIX) 40 MG tablet Take 1 tablet (40 mg total) by mouth 2 (two) times daily. 01/22/22  Yes Thurnell Lose, MD  verapamil (CALAN) 120 MG tablet Take 120 mg by mouth 3 (three) times daily. Take one tablet three times a day for 90 days. 04/29/22  Yes [provider]  sevelamer carbonate (RENVELA) 800 MG tablet Take 2 tablets (1,600 mg total) by mouth 3 (three) times daily with meals. Patient not taking: Reported on 06/13/2022 12/30/21   Jonetta Osgood, MD   Current Facility-Administered Medications  Medication Dose Route Frequency Provider Last Rate Last Admin   acetaminophen (TYLENOL) tablet 650 mg  650 mg Oral Q6H PRN Irene Pap N, DO       cefTRIAXone  (ROCEPHIN) 2 g in sodium chloride 0.9 % 100 mL IVPB  2 g Intravenous Q24H Arrien, Jimmy Picket, MD       HYDROmorphone (DILAUDID) injection 0.5 mg  0.5 mg Intravenous Q4H PRN Irene Pap N, DO       metoprolol tartrate (LOPRESSOR) tablet 25 mg  25 mg Oral BID Irene Pap N, DO   25 mg at 06/13/22 0940   oxyCODONE (Oxy IR/ROXICODONE) immediate release tablet 5 mg  5 mg Oral Q6H PRN Irene Pap N, DO   5 mg at 06/13/22 0232   pantoprazole (PROTONIX) EC tablet 40 mg  40 mg Oral BID Irene Pap N, DO   40 mg at 06/13/22 0940   prochlorperazine (COMPAZINE) injection 10 mg  10 mg Intravenous Q6H PRN Irene Pap N, DO       sevelamer carbonate (RENVELA) tablet 1,600 mg  1,600 mg Oral TID WC Hall, Carole N, DO       sodium zirconium cyclosilicate (LOKELMA) packet  10 g  10 g Oral TID Irene Pap N, DO   10 g at 06/13/22 6962   vancomycin variable dose per unstable renal function (pharmacist dosing)   Does not apply See admin instructions Arrien, Jimmy Picket, MD       Current Outpatient Medications  Medication Sig Dispense Refill   acetaminophen (TYLENOL) 500 MG tablet Take 1,000 mg by mouth every 6 (six) hours as needed for moderate pain.     albuterol (VENTOLIN HFA) 108 (90 Base) MCG/ACT inhaler Inhale 2 puffs into the lungs every 4 (four) hours as needed for wheezing or shortness of breath.     allopurinol (ZYLOPRIM) 100 MG tablet Take 100 mg by mouth 2 (two) times daily.     apixaban (ELIQUIS) 2.5 MG TABS tablet Take 1 tablet (2.5 mg total) by mouth 2 (two) times daily. 60 tablet 11   cyclobenzaprine (FLEXERIL) 5 MG tablet Take 2.5 mg by mouth daily as needed for muscle spasms.     fluticasone-salmeterol (ADVAIR) 250-50 MCG/ACT AEPB Inhale 1 puff into the lungs 2 (two) times daily.     hydrALAZINE (APRESOLINE) 50 MG tablet Take 1 tablet (50 mg total) by mouth 3 (three) times daily. 270 tablet 3   Lidocaine 4 % PTCH Apply 1 patch topically daily as needed (pain).     metoprolol tartrate  (LOPRESSOR) 50 MG tablet Take 1 tablet (50 mg total) by mouth 2 (two) times daily.     ondansetron (ZOFRAN-ODT) 4 MG disintegrating tablet Take 1 tablet (4 mg total) by mouth every 8 (eight) hours as needed for nausea or vomiting. 20 tablet 0   oxyCODONE (ROXICODONE) 5 MG immediate release tablet Take 1 tablet (5 mg total) by mouth every 6 (six) hours as needed for severe pain. 20 tablet 0   pantoprazole (PROTONIX) 40 MG tablet Take 1 tablet (40 mg total) by mouth 2 (two) times daily. 60 tablet 0   verapamil (CALAN) 120 MG tablet Take 120 mg by mouth 3 (three) times daily. Take one tablet three times a day for 90 days.     sevelamer carbonate (RENVELA) 800 MG tablet Take 2 tablets (1,600 mg total) by mouth 3 (three) times daily with meals. (Patient not taking: Reported on 06/13/2022)       ROS: As per HPI otherwise negative.  Physical Exam: Vitals:   06/13/22 0730 06/13/22 0800 06/13/22 0940 06/13/22 1122  BP:  (!) 148/84  (!) 161/91  Pulse: 89 88 95 87  Resp: 19 (!) 25  20  Temp:      TempSrc:      SpO2: 96% 96%  99%     General: Appears comfortable, in no distress  Head: NCAT sclera not icteric MMM Neck: Supple. No JVD appreciated  Lungs: Clear bilaterally without wheezes, rales, or rhonchi. Normal WOB  Heart: RRR, no murmur, rub, or gallop  Abdomen: soft non-tender, bowel sounds normal, no masses  Lower extremities:without edema or ischemic changes, no open wounds  Neuro: A & O X 2 Follows commands  Psych:  Disoriented  Dialysis Access: R IJ TDC in place; LUE AVF +bruit   Labs: Basic Metabolic Panel: Recent Labs  Lab 06/07/22 2212 06/12/22 1545 06/12/22 1649 06/13/22 0444  NA 140 129* 127*  128* 129*  K 4.5 5.6* 5.5*  5.4* 5.0  CL 105 94* 99 97*  CO2 22 16*  --  14*  GLUCOSE 100* 90 83 70  BUN 50* 91* 80* 94*  CREATININE 7.92* 11.73* 13.60* 12.08*  CALCIUM 10.3 9.3  --  9.0  PHOS  --   --   --  8.9*   Liver Function Tests: Recent Labs  Lab 06/07/22 2212  06/12/22 1545 06/13/22 0444  AST 24 16  --   ALT 14 10  --   ALKPHOS 59 56  --   BILITOT 1.1 1.1  --   PROT 8.1 7.0  --   ALBUMIN 4.2 3.0* 2.6*   Recent Labs  Lab 06/07/22 2212 06/12/22 1545  LIPASE 41 52*   Recent Labs  Lab 06/12/22 1545  AMMONIA 40*   CBC: Recent Labs  Lab 06/07/22 2212 06/12/22 1545 06/12/22 1649 06/13/22 0444  WBC 6.1 8.6  --  7.5  NEUTROABS 4.5 7.6  --  6.3  HGB 13.0 12.7 13.9  14.3 11.2*  HCT 40.7 37.5 41.0  42.0 34.7*  MCV 101.0* 95.9  --  98.6  PLT 117* 157  --  136*   Cardiac Enzymes: No results for input(s): "CKTOTAL", "CKMB", "CKMBINDEX", "TROPONINI" in the last 168 hours. CBG: Recent Labs  Lab 06/12/22 1631  GLUCAP 100*   Iron Studies: No results for input(s): "IRON", "TIBC", "TRANSFERRIN", "FERRITIN" in the last 72 hours. Studies/Results: CT ABDOMEN PELVIS W CONTRAST  Result Date: 06/12/2022 CLINICAL DATA:  Abdominal pain and diarrhea for 2 days EXAM: CT ABDOMEN AND PELVIS WITH CONTRAST TECHNIQUE: Multidetector CT imaging of the abdomen and pelvis was performed using the standard protocol following bolus administration of intravenous contrast. RADIATION DOSE REDUCTION: This exam was performed according to the departmental dose-optimization program which includes automated exposure control, adjustment of the mA and/or kV according to patient size and/or use of iterative reconstruction technique. CONTRAST:  167m OMNIPAQUE IOHEXOL 300 MG/ML  SOLN COMPARISON:  01/16/2022 CT, MRI from 01/17/2022 FINDINGS: Lower chest: No acute abnormality. Hepatobiliary: Liver is well visualized without focal mass lesion. Gallbladder is well distended. Known cholelithiasis is seen and stable. Wall thickening is noted similar to that seen as well. An enhancing mass is again seen in the region of the fundus measuring up to 2.8 cm again suspicious for gallbladder carcinoma. No biliary ductal dilatation is seen. Pancreas: Unremarkable. No pancreatic ductal  dilatation or surrounding inflammatory changes. Spleen: Calcified granulomas are noted.  No mass lesion is seen. Adrenals/Urinary Tract: Adrenal glands are within normal limits. Kidneys demonstrate a normal enhancement pattern. Scattered cysts are again identified and stable. No follow-up is recommended. Cortical calcifications as well as nonobstructing stones are noted on the right. No obstructive changes are noted. The bladder is decompressed with wall thickening identified Stomach/Bowel: The appendix is well visualized and within normal limits. No obstructive or inflammatory changes of the colon are seen. Small bowel shows no obstructive changes. Stomach is fluid-filled. Vascular/Lymphatic: Aortic atherosclerosis. No enlarged abdominal or pelvic lymph nodes. Reproductive: Uterus and bilateral adnexa are unremarkable. Other: No abdominal wall hernia or abnormality. No abdominopelvic ascites. Musculoskeletal: Degenerative changes of lumbar spine are noted worst at L4-5 with endplate sclerosis and disc space widening likely related to prior discitis. The overall appearance is stable from the prior study. IMPRESSION: Cholelithiasis with wall thickening in the gallbladder as well as a gallbladder fundal mass suspicious for carcinoma. These changes are stable from prior MR and CT. Nonobstructing renal calculi on the right. Chronic changes at L4-L5 likely related to prior discitis. Electronically Signed   By: MInez CatalinaM.D.   On: 06/12/2022 22:51   CT Head Wo Contrast  Result Date: 06/12/2022 CLINICAL DATA:  Altered mental status  EXAM: CT HEAD WITHOUT CONTRAST TECHNIQUE: Contiguous axial images were obtained from the base of the skull through the vertex without intravenous contrast. RADIATION DOSE REDUCTION: This exam was performed according to the departmental dose-optimization program which includes automated exposure control, adjustment of the mA and/or kV according to patient size and/or use of iterative  reconstruction technique. COMPARISON:  None Available. FINDINGS: Brain: No evidence of acute infarction, hemorrhage, hydrocephalus, extra-axial collection or mass lesion/mass effect. Chronic atrophic and ischemic changes are noted. Vascular: No hyperdense vessel or unexpected calcification. Skull: Normal. Negative for fracture or focal lesion. Sinuses/Orbits: No acute finding. Other: None. IMPRESSION: Chronic atrophic and ischemic changes without acute abnormality. Electronically Signed   By: Inez Catalina M.D.   On: 06/12/2022 22:45   DG Chest 1 View  Result Date: 06/12/2022 CLINICAL DATA:  Questionable sepsis EXAM: CHEST  1 VIEW COMPARISON:  Radiograph 03/09/2022 FINDINGS: Right neck catheter tip overlies the superior cavoatrial junction. Unchanged cardiomediastinal silhouette. There is no focal airspace consolidation. There is no pleural effusion. There is no pneumothorax. There is no acute osseous abnormality. Skin fold overlies the left lower chest. IMPRESSION: No evidence of acute cardiopulmonary disease. Electronically Signed   By: Maurine Simmering M.D.   On: 06/12/2022 16:25    Dialysis Orders:  Unit: [place] TTS  Time: 4:00  EDW: 60.3 kg  Flows: 400/600 Bath: 2K/2Ca  Access: AVF (2nd stage BVT 03/08/22)  Heparin: No bolus heparin  ESA: Mircera 150 q 2 wks (last 6/24)  VDRA: Hectorol 4 q HD   Assessment/Plan: MRSA Bacteremia. H/o MRSA bacteremia/endocarditis. IV Vancomycin per primary. For TEE. Will need dialysis cathter removed. Has AVF that should be functional. AMS - No acute findings on head CT. Likely secondary to missed dialysis Abdominal pain/Gallbladder mass -  W/u per primary team  ESRD -  HD TTS. Last dialysis on 7/1. Missed dialysis this week d/t #3. Plan for dialysis 1st shift Monday,  then resume regular schedule.  Use fistula.  K 5.0. Lokelma ordered.  Hypertension/volume  - Blood pressure elevated. Continue home meds. Should improve with dialysis.  Anemia  - Hgb 11.2. No ESA needs.   Metabolic bone disease -  Phos above goal. Continue Renvela binders.  Hx atrial flutter   Lynnda Child PA-C [facility] 06/13/2022, 12:11 PM

## 2022-06-13 NOTE — ED Notes (Signed)
ED TO INPATIENT HANDOFF REPORT  ED Nurse Name and Phone #: [phone]  S Name/Age/Gender Diamond [name] 71 y.o. female Room/Bed: 011C/011C  Code Status   Code Status: Full Code  Home/SNF/Other Home Patient oriented to: self Is this baseline? No   Triage Complete: Triage complete  Chief Complaint Generalized abdominal pain [R10.84]  Triage Note Patient complains of abdominal pain with vomiting and diarrhea x 2 days, last dialysis Tuesday and states too sick to go Thursday. Patient alert but slow to respond to questions   Allergies Allergies  Allergen Reactions   Shellfish Allergy     Unknown reaction     Level of Care/Admitting Diagnosis ED Disposition     ED Disposition  Admit   Condition  --   [place]: [place] [[id number]]  Level of Care: Telemetry Medical [104]  May admit patient to [facility] or [facility] if equivalent level of care is available:: No  Covid Evaluation: Asymptomatic - no recent exposure (last 10 days) testing not required  Diagnosis: Generalized abdominal pain [101751]  Admitting Physician: Kayleen Memos [[id number]]  Attending Physician: Kayleen Memos [[id number]]  Certification:: I certify this patient will need inpatient services for at least 2 midnights  Estimated Length of Stay: 2          B Medical/Surgery History Past Medical History:  Diagnosis Date   Anemia    Asthma    Atrial flutter with rapid ventricular response ([place]) 12/25/2021   Chronic kidney disease    dialysis Tues Thurs Sat   Dysrhythmia    PAF 10/2019 in setting of COVID-19 PNA   Gout    Hypertension    Past Surgical History:  Procedure Laterality Date   AV FISTULA PLACEMENT Left 12/23/2021   Procedure: LEFT ARM BRACHIOBASILIC VEIN ARTERIOVENOUS (AV) FISTULA CREATION;  Surgeon: [name], MD;  Location: [facility];  Service: Vascular;  Laterality: Left;   [place] Left 03/08/2022   Procedure: LEFT SECOND  STAGE BASILIC VEIN TRANSPOSITION;  Surgeon: [name], MD;  Location: [facility] OR;  Service: Vascular;  Laterality: Left;   BIOPSY  01/19/2022   Procedure: BIOPSY;  Surgeon: Lavena Bullion, DO;  Location: [facility] ENDOSCOPY;  Service: Gastroenterology;;   [place]   COLONOSCOPY Left 01/19/2022   Procedure: COLONOSCOPY;  Surgeon: Lavena Bullion, DO;  Location: [facility];  Service: Gastroenterology;  Laterality: Left;   ESOPHAGOGASTRODUODENOSCOPY Left 01/19/2022   Procedure: ESOPHAGOGASTRODUODENOSCOPY (EGD);  Surgeon: Lavena Bullion, DO;  [facility];  Service: Gastroenterology;  Laterality: Left;   INSERTION OF DIALYSIS CATHETER Right 12/23/2021   Procedure: INSERTION OF DIALYSIS CATHETER;  Surgeon: [name], MD;  Location: [facility];  Service: Vascular;  Laterality: Right;   POLYPECTOMY  01/19/2022   Procedure: POLYPECTOMY;  Surgeon: Lavena Bullion, DO;  Location: [facility] ENDOSCOPY;  Service: Gastroenterology;;   TEE WITHOUT CARDIOVERSION N/A 12/22/2021   Procedure: TRANSESOPHAGEAL ECHOCARDIOGRAM (TEE);  Surgeon: Jerline Pain, MD;  Location: [facility] ENDOSCOPY;  Service: Cardiovascular;  Laterality: N/A;   ULTRASOUND GUIDANCE FOR VASCULAR ACCESS  12/23/2021   Procedure: ULTRASOUND GUIDANCE FOR VASCULAR ACCESS;  Surgeon: [name], MD;  Location: [facility] [place];  Service: Vascular;;     A IV Location/Drains/Wounds Patient Lines/Drains/Airways Status     Active Line/Drains/Airways     Name Placement date Placement time Site Days   Peripheral IV 06/12/22 22 G 1" Right;Medial Forearm 06/12/22  2122  Forearm  1   Fistula / Graft Left Forearm Arteriovenous fistula 12/23/21  1646  Forearm  172   Hemodialysis Catheter Right Subclavian Double lumen Permanent (Tunneled) 12/23/21  1705  Subclavian  172   Incision (Closed) 12/23/21 Arm Left 12/23/21  1635  -- 172   Incision (Closed) 12/23/21 Chest Right 12/23/21  1635  -- 172   Incision (Closed) 03/08/22 Arm Left 03/08/22   0906  -- 97   Wound / Incision (Open or Dehisced) 01/17/22 (MASD) Moisture Associated Skin Damage Groin Right;Left 01/17/22  2030  Groin  147   Wound / Incision (Open or Dehisced) 01/17/22 (MASD) Moisture Associated Skin Damage Perineum Medial 01/17/22  2030  Perineum  147            Intake/Output Last 24 hours  Intake/Output Summary (Last 24 hours) at 06/13/2022 1404 Last data filed at 06/13/2022 1320 Gross per 24 hour  Intake 349.9 ml  Output --  Net 349.9 ml    Labs/Imaging Results for orders placed or performed during the hospital encounter of 06/12/22 (from the past 48 hour(s))  Resp Panel by RT-PCR (Flu A&B, Covid) Anterior Nasal Swab     Status: None   Collection Time: 06/12/22  3:08 PM   Specimen: Anterior Nasal Swab  Result Value Ref Range   SARS Coronavirus 2 by RT PCR NEGATIVE NEGATIVE    Comment: (NOTE) SARS-CoV-2 target nucleic acids are NOT DETECTED.  The SARS-CoV-2 RNA is generally detectable in upper respiratory specimens during the acute phase of infection. The lowest concentration of SARS-CoV-2 viral copies this assay can detect is 138 copies/mL. A negative result does not preclude SARS-Cov-2 infection and should not be used as the sole basis for treatment or other patient management decisions. A negative result may occur with  improper specimen collection/handling, submission of specimen other than nasopharyngeal swab, presence of viral mutation(s) within the areas targeted by this assay, and inadequate number of viral copies(<138 copies/mL). A negative result must be combined with clinical observations, patient history, and epidemiological information. The expected result is Negative.  Fact Sheet for Patients:  EntrepreneurPulse.com.au  Fact Sheet for Healthcare Providers:  IncredibleEmployment.be  This test is no t yet approved or cleared by the [place] FDA and  has been authorized for detection and/or  diagnosis of SARS-CoV-2 by FDA under an Emergency Use Authorization (EUA). This EUA will remain  in effect (meaning this test can be used) for the duration of the COVID-19 declaration under Section 564(b)(1) of the Act, 21 U.S.C.section 360bbb-3(b)(1), unless the authorization is terminated  or revoked sooner.       Influenza A by PCR NEGATIVE NEGATIVE   Influenza B by PCR NEGATIVE NEGATIVE    Comment: (NOTE) The Xpert Xpress SARS-CoV-2/FLU/RSV plus assay is intended as an aid in the diagnosis of influenza from Nasopharyngeal swab specimens and should not be used as a sole basis for treatment. Nasal washings and aspirates are unacceptable for Xpert Xpress SARS-CoV-2/FLU/RSV testing.  Fact Sheet for Patients: EntrepreneurPulse.com.au  Fact Sheet for Healthcare Providers: IncredibleEmployment.be  This test is not yet approved or cleared by the [place] FDA and has been authorized for detection and/or diagnosis of SARS-CoV-2 by FDA under an Emergency Use Authorization (EUA). This EUA will remain in effect (meaning this test can be used) for the duration of the COVID-19 declaration under Section 564(b)(1) of the Act, 21 U.S.C. section 360bbb-3(b)(1), unless the authorization is terminated or revoked.  Performed at [facility], [place] [address]  [place], [place] [address]   Blood Culture (routine x 2)     Status: None (Preliminary result)   Collection Time: 06/12/22  3:11 PM   Specimen: BLOOD LEFT HAND  Result Value Ref Range   Specimen Description BLOOD LEFT HAND    Special Requests      BOTTLES DRAWN AEROBIC ONLY Blood Culture results may not be optimal due to an inadequate volume of blood received in culture bottles   Culture  Setup Time      GRAM POSITIVE COCCI IN CLUSTERS AEROBIC BOTTLE ONLY CRITICAL VALUE NOTED.  VALUE IS CONSISTENT WITH PREVIOUSLY REPORTED AND CALLED VALUE. Performed at [facility], [place] [address] [place], [place] [address]    Culture GRAM POSITIVE COCCI    Report Status PENDING   Lactic acid, plasma     Status: None   Collection Time: 06/12/22  3:45 PM  Result Value Ref Range   Lactic Acid, Venous 1.0 0.5 - 1.9 mmol/L    Comment: Performed at [facility], [place] [address] [place], [place] [address]  Comprehensive metabolic panel     Status: Abnormal   Collection Time: 06/12/22  3:45 PM  Result Value Ref Range   Sodium 129 (L) 135 - 145 mmol/L   Potassium 5.6 (H) 3.5 - 5.1 mmol/L   Chloride 94 (L) 98 - 111 mmol/L   CO2 16 (L) 22 - 32 mmol/L   Glucose, Bld 90 70 - 99 mg/dL    Comment: Glucose reference range applies only to samples taken after fasting for at least 8 hours.   BUN 91 (H) 8 - 23 mg/dL   Creatinine, Ser 11.73 (H) 0.44 - 1.00 mg/dL   Calcium 9.3 8.9 - 10.3 mg/dL   Total Protein 7.0 6.5 - 8.1 g/dL   Albumin 3.0 (L) 3.5 - 5.0 g/dL   AST 16 15 - 41 U/L   ALT 10 0 - 44 U/L   Alkaline Phosphatase 56 38 - 126 U/L   Total Bilirubin 1.1 0.3 - 1.2 mg/dL   GFR, Estimated 3 (L) >60 mL/min    Comment: (NOTE) Calculated using the CKD-EPI Creatinine Equation (2021)    Anion gap 19 (H) 5 - 15    Comment: Performed at [facility], [place] [address] [place], [place] [address]  CBC with Differential     Status: Abnormal   Collection Time: 06/12/22  3:45 PM  Result Value Ref Range   WBC 8.6 4.0 - 10.5 K/uL   RBC 3.91 3.87 - 5.11 MIL/uL   Hemoglobin 12.7 12.0 - 15.0 g/dL   HCT 37.5 36.0 - 46.0 %   MCV 95.9 80.0 - 100.0 fL   MCH 32.5 26.0 - 34.0 pg   MCHC 33.9 30.0 - 36.0 g/dL   RDW 18.5 (H) 11.5 - 15.5 %   Platelets 157 150 - 400 K/uL   nRBC 0.0 0.0 - 0.2 %   Neutrophils Relative % 87 %   Neutro Abs 7.6 1.7 - 7.7 K/uL   Lymphocytes Relative 4 %   Lymphs Abs 0.3 (L) 0.7 - 4.0 K/uL   Monocytes Relative 7 %   Monocytes Absolute 0.6 0.1 - 1.0 K/uL   Eosinophils Relative 1 %   Eosinophils Absolute 0.0 0.0 - 0.5 K/uL   Basophils Relative 0 %   Basophils  Absolute 0.0 0.0 - 0.1 K/uL   Immature Granulocytes 1 %   Abs Immature Granulocytes 0.04 0.00 - 0.07 K/uL  Comment: Performed at [facility], [place] [address], [place], [place] [address]  Protime-INR     Status: None   Collection Time: 06/12/22  3:45 PM  Result Value Ref Range   Prothrombin Time 15.2 11.4 - 15.2 seconds   INR 1.2 0.8 - 1.2    Comment: (NOTE) INR goal varies based on device and disease states. Performed at [facility], [place] [address] [place], [place] [address]   APTT     Status: Abnormal   Collection Time: 06/12/22  3:45 PM  Result Value Ref Range   aPTT 37 (H) 24 - 36 seconds    Comment:        IF BASELINE aPTT IS ELEVATED, SUGGEST PATIENT RISK ASSESSMENT BE USED TO DETERMINE APPROPRIATE ANTICOAGULANT THERAPY. Performed at [facility], [place] [address], [place], [place] [address]   Blood Culture (routine x 2)     Status: None (Preliminary result)   Collection Time: 06/12/22  3:45 PM   Specimen: BLOOD  Result Value Ref Range   Specimen Description BLOOD SITE NOT SPECIFIED    Special Requests      BOTTLES DRAWN AEROBIC AND ANAEROBIC Blood Culture results may not be optimal due to an inadequate volume of blood received in culture bottles   Culture  Setup Time      GRAM POSITIVE COCCI IN BOTH AEROBIC AND ANAEROBIC BOTTLES CRITICAL RESULT CALLED TO, READ BACK BY AND VERIFIED WITH: PHARMD Popponesset AT 4196 ON 06/13/2022 BY T.SAAD. Performed at [facility], [place] [address], [place], [place] [address]    Culture GRAM POSITIVE COCCI    Report Status PENDING   Lipase, blood     Status: Abnormal   Collection Time: 06/12/22  3:45 PM  Result Value Ref Range   Lipase 52 (H) 11 - 51 U/L    Comment: Performed at [facility], [place] [address] [place], [place] [address]  Brain natriuretic peptide     Status: Abnormal   Collection Time: 06/12/22  3:45 PM  Result Value Ref Range   B Natriuretic Peptide 814.2 (H) 0.0 - 100.0 pg/mL     Comment: Performed at [facility] [address], [place], [place] [address]  Troponin I (High Sensitivity)     Status: Abnormal   Collection Time: 06/12/22  3:45 PM  Result Value Ref Range   Troponin I (High Sensitivity) 19 (H) <18 ng/L    Comment: (NOTE) Elevated high sensitivity troponin I (hsTnI) values and significant  changes across serial measurements may suggest ACS but many other  chronic and acute conditions are known to elevate hsTnI results.  Refer to the "Links" section for chest pain algorithms and additional  guidance. Performed at [facility], [place] [address], [place], [place] [address]   Ammonia     Status: Abnormal   Collection Time: 06/12/22  3:45 PM  Result Value Ref Range   Ammonia 40 (H) 9 - 35 umol/L    Comment: Performed at [facility], [place] [address], [place], [place] [address]  Blood Culture ID Panel (Reflexed)     Status: Abnormal   Collection Time: 06/12/22  3:45 PM  Result Value Ref Range   Enterococcus faecalis NOT DETECTED NOT DETECTED   Enterococcus Faecium NOT DETECTED NOT DETECTED   Listeria monocytogenes NOT DETECTED NOT DETECTED   Staphylococcus species DETECTED (A) NOT DETECTED    Comment: CRITICAL RESULT CALLED TO, READ BACK BY  AND VERIFIED WITH: PHARMD M.MACCIA AT 0750 ON 06/13/2022 BY T.SAAD.    Staphylococcus aureus (BCID) DETECTED (A) NOT DETECTED    Comment: Methicillin (oxacillin)-resistant Staphylococcus aureus (MRSA). MRSA is predictably resistant to beta-lactam antibiotics (except ceftaroline). Preferred therapy is vancomycin unless clinically contraindicated. Patient requires contact precautions if  hospitalized. CRITICAL RESULT CALLED TO, READ BACK BY AND VERIFIED WITH: PHARMD M.MACCIA AT 0750 ON 06/13/2022 BY T.SAAD.    Staphylococcus epidermidis NOT DETECTED NOT DETECTED   Staphylococcus lugdunensis NOT DETECTED NOT DETECTED   Streptococcus species NOT DETECTED NOT DETECTED   Streptococcus agalactiae NOT  DETECTED NOT DETECTED   Streptococcus pneumoniae NOT DETECTED NOT DETECTED   Streptococcus pyogenes NOT DETECTED NOT DETECTED   A.calcoaceticus-baumannii NOT DETECTED NOT DETECTED   Bacteroides fragilis NOT DETECTED NOT DETECTED   Enterobacterales NOT DETECTED NOT DETECTED   Enterobacter cloacae complex NOT DETECTED NOT DETECTED   Escherichia coli NOT DETECTED NOT DETECTED   Klebsiella aerogenes NOT DETECTED NOT DETECTED   Klebsiella oxytoca NOT DETECTED NOT DETECTED   Klebsiella pneumoniae NOT DETECTED NOT DETECTED   Proteus species NOT DETECTED NOT DETECTED   Salmonella species NOT DETECTED NOT DETECTED   Serratia marcescens NOT DETECTED NOT DETECTED   Haemophilus influenzae NOT DETECTED NOT DETECTED   Neisseria meningitidis NOT DETECTED NOT DETECTED   Pseudomonas aeruginosa NOT DETECTED NOT DETECTED   Stenotrophomonas maltophilia NOT DETECTED NOT DETECTED   Candida albicans NOT DETECTED NOT DETECTED   Candida auris NOT DETECTED NOT DETECTED   Candida glabrata NOT DETECTED NOT DETECTED   Candida krusei NOT DETECTED NOT DETECTED   Candida parapsilosis NOT DETECTED NOT DETECTED   Candida tropicalis NOT DETECTED NOT DETECTED   Cryptococcus neoformans/gattii NOT DETECTED NOT DETECTED   Meth resistant mecA/C and MREJ DETECTED (A) NOT DETECTED    Comment: CRITICAL RESULT CALLED TO, READ BACK BY AND VERIFIED WITH: PHARMD M.MACCIA AT 0750 ON 06/13/2022 BY T.SAAD. Performed at [facility], [place] [address] [place], [place] [address]   CBG monitoring, ED     Status: Abnormal   Collection Time: 06/12/22  4:31 PM  Result Value Ref Range   Glucose-Capillary 100 (H) 70 - 99 mg/dL    Comment: Glucose reference range applies only to samples taken after fasting for at least 8 hours.  Lactic acid, plasma     Status: None   Collection Time: 06/12/22  4:40 PM  Result Value Ref Range   Lactic Acid, Venous 1.5 0.5 - 1.9 mmol/L    Comment: Performed at [facility] [address], [place], [place] [address]  I-Stat Chem 8, ED     Status: Abnormal   Collection Time: 06/12/22  4:49 PM  Result Value Ref Range   Sodium 128 (L) 135 - 145 mmol/L   Potassium 5.4 (H) 3.5 - 5.1 mmol/L   Chloride 99 98 - 111 mmol/L   BUN 80 (H) 8 - 23 mg/dL   Creatinine, Ser 13.60 (H) 0.44 - 1.00 mg/dL   Glucose, Bld 83 70 - 99 mg/dL    Comment: Glucose reference range applies only to samples taken after fasting for at least 8 hours.   Calcium, Ion 1.10 (L) 1.15 - 1.40 mmol/L   TCO2 18 (L) 22 - 32 mmol/L   Hemoglobin 14.3 12.0 - 15.0 g/dL   HCT 42.0 36.0 - 46.0 %  I-Stat venous blood gas, ED ([facility])     Status: Abnormal   Collection Time: 06/12/22  4:49 PM  Result  Value Ref Range   pH, Ven 7.354 7.25 - 7.43   pCO2, Ven 32.4 (L) 44 - 60 mmHg   pO2, Ven 198 (H) 32 - 45 mmHg   Bicarbonate 18.0 (L) 20.0 - 28.0 mmol/L   TCO2 19 (L) 22 - 32 mmol/L   O2 Saturation 100 %   Acid-base deficit 6.0 (H) 0.0 - 2.0 mmol/L   Sodium 127 (L) 135 - 145 mmol/L   Potassium 5.5 (H) 3.5 - 5.1 mmol/L   Calcium, Ion 1.11 (L) 1.15 - 1.40 mmol/L   HCT 41.0 36.0 - 46.0 %   Hemoglobin 13.9 12.0 - 15.0 g/dL   Sample type VENOUS   Troponin I (High Sensitivity)     Status: Abnormal   Collection Time: 06/12/22  5:06 PM  Result Value Ref Range   Troponin I (High Sensitivity) 22 (H) <18 ng/L    Comment: (NOTE) Elevated high sensitivity troponin I (hsTnI) values and significant  changes across serial measurements may suggest ACS but many other  chronic and acute conditions are known to elevate hsTnI results.  Refer to the "Links" section for chest pain algorithms and additional  guidance. Performed at [facility], [place] [address] [place], [place] [address]   Urinalysis, Routine w reflex microscopic     Status: Abnormal   Collection Time: 06/12/22  8:40 PM  Result Value Ref Range   Color, Urine STRAW (A) YELLOW   APPearance CLEAR CLEAR   Specific Gravity, Urine 1.006 1.005 - 1.030   pH 7.0 5.0 - 8.0    Glucose, UA NEGATIVE NEGATIVE mg/dL   Hgb urine dipstick SMALL (A) NEGATIVE   Bilirubin Urine NEGATIVE NEGATIVE   Ketones, ur NEGATIVE NEGATIVE mg/dL   Protein, ur 30 (A) NEGATIVE mg/dL   Nitrite NEGATIVE NEGATIVE   Leukocytes,Ua NEGATIVE NEGATIVE   RBC / HPF 0-5 0 - 5 RBC/hpf   WBC, UA 0-5 0 - 5 WBC/hpf   Bacteria, UA NONE SEEN NONE SEEN   Squamous Epithelial / LPF 0-5 0 - 5    Comment: Performed at [facility] [address] [place], [place] [address]  CBC with Differential/Platelet     Status: Abnormal   Collection Time: 06/13/22  4:44 AM  Result Value Ref Range   WBC 7.5 4.0 - 10.5 K/uL   RBC 3.52 (L) 3.87 - 5.11 MIL/uL   Hemoglobin 11.2 (L) 12.0 - 15.0 g/dL   HCT 34.7 (L) 36.0 - 46.0 %   MCV 98.6 80.0 - 100.0 fL   MCH 31.8 26.0 - 34.0 pg   MCHC 32.3 30.0 - 36.0 g/dL   RDW 18.5 (H) 11.5 - 15.5 %   Platelets 136 (L) 150 - 400 K/uL   nRBC 0.0 0.0 - 0.2 %   Neutrophils Relative % 84 %   Neutro Abs 6.3 1.7 - 7.7 K/uL   Lymphocytes Relative 7 %   Lymphs Abs 0.5 (L) 0.7 - 4.0 K/uL   Monocytes Relative 8 %   Monocytes Absolute 0.6 0.1 - 1.0 K/uL   Eosinophils Relative 1 %   Eosinophils Absolute 0.0 0.0 - 0.5 K/uL   Basophils Relative 0 %   Basophils Absolute 0.0 0.0 - 0.1 K/uL   Immature Granulocytes 0 %   Abs Immature Granulocytes 0.03 0.00 - 0.07 K/uL    Comment: Performed at [facility], [place] [address], [place],  [address]  Renal function panel     Status: Abnormal   Collection Time: 06/13/22  4:44 AM  Result Value Ref Range   Sodium 129 (L) 135 - 145 mmol/L   Potassium 5.0 3.5 - 5.1 mmol/L   Chloride 97 (L) 98 - 111 mmol/L   CO2 14 (L) 22 - 32 mmol/L   Glucose, Bld 70 70 - 99 mg/dL    Comment: Glucose reference range applies only to samples taken after fasting for at least 8 hours.   BUN 94 (H) 8 - 23 mg/dL   Creatinine, Ser 12.08 (H) 0.44 - 1.00 mg/dL   Calcium 9.0 8.9 - 10.3 mg/dL   Phosphorus 8.9 (H) 2.5 - 4.6 mg/dL   Albumin 2.6 (L)  3.5 - 5.0 g/dL   GFR, Estimated 3 (L) >60 mL/min    Comment: (NOTE) Calculated using the CKD-EPI Creatinine Equation (2021)    Anion gap 18 (H) 5 - 15    Comment: Performed at [facility] [address], [place], [place] [address]   CT ABDOMEN PELVIS W CONTRAST  Result Date: 06/12/2022 CLINICAL DATA:  Abdominal pain and diarrhea for 2 days EXAM: CT ABDOMEN AND PELVIS WITH CONTRAST TECHNIQUE: Multidetector CT imaging of the abdomen and pelvis was performed using the standard protocol following bolus administration of intravenous contrast. RADIATION DOSE REDUCTION: This exam was performed according to the departmental dose-optimization program which includes automated exposure control, adjustment of the mA and/or kV according to patient size and/or use of iterative reconstruction technique. CONTRAST:  127m OMNIPAQUE IOHEXOL 300 MG/ML  SOLN COMPARISON:  01/16/2022 CT, MRI from 01/17/2022 FINDINGS: Lower chest: No acute abnormality. Hepatobiliary: Liver is well visualized without focal mass lesion. Gallbladder is well distended. Known cholelithiasis is seen and stable. Wall thickening is noted similar to that seen as well. An enhancing mass is again seen in the region of the fundus measuring up to 2.8 cm again suspicious for gallbladder carcinoma. No biliary ductal dilatation is seen. Pancreas: Unremarkable. No pancreatic ductal dilatation or surrounding inflammatory changes. Spleen: Calcified granulomas are noted.  No mass lesion is seen. Adrenals/Urinary Tract: Adrenal glands are within normal limits. Kidneys demonstrate a normal enhancement pattern. Scattered cysts are again identified and stable. No follow-up is recommended. Cortical calcifications as well as nonobstructing stones are noted on the right. No obstructive changes are noted. The bladder is decompressed with wall thickening identified Stomach/Bowel: The appendix is well visualized and within normal limits. No obstructive or inflammatory  changes of the colon are seen. Small bowel shows no obstructive changes. Stomach is fluid-filled. Vascular/Lymphatic: Aortic atherosclerosis. No enlarged abdominal or pelvic lymph nodes. Reproductive: Uterus and bilateral adnexa are unremarkable. Other: No abdominal wall hernia or abnormality. No abdominopelvic ascites. Musculoskeletal: Degenerative changes of lumbar spine are noted worst at L4-5 with endplate sclerosis and disc space widening likely related to prior discitis. The overall appearance is stable from the prior study. IMPRESSION: Cholelithiasis with wall thickening in the gallbladder as well as a gallbladder fundal mass suspicious for carcinoma. These changes are stable from prior MR and CT. Nonobstructing renal calculi on the right. Chronic changes at L4-L5 likely related to prior discitis. Electronically Signed   By: MInez CatalinaM.D.   On: 06/12/2022 22:51   CT Head Wo Contrast  Result Date: 06/12/2022 CLINICAL DATA:  Altered mental status EXAM: CT HEAD WITHOUT CONTRAST TECHNIQUE: Contiguous axial images were obtained from the base of the skull through the vertex without intravenous contrast. RADIATION DOSE REDUCTION: This exam was performed according to the departmental dose-optimization program which includes automated exposure control, adjustment of  the mA and/or kV according to patient size and/or use of iterative reconstruction technique. COMPARISON:  None Available. FINDINGS: Brain: No evidence of acute infarction, hemorrhage, hydrocephalus, extra-axial collection or mass lesion/mass effect. Chronic atrophic and ischemic changes are noted. Vascular: No hyperdense vessel or unexpected calcification. Skull: Normal. Negative for fracture or focal lesion. Sinuses/Orbits: No acute finding. Other: None. IMPRESSION: Chronic atrophic and ischemic changes without acute abnormality. Electronically Signed   By: Inez Catalina M.D.   On: 06/12/2022 22:45   DG Chest 1 View  Result Date:  06/12/2022 CLINICAL DATA:  Questionable sepsis EXAM: CHEST  1 VIEW COMPARISON:  Radiograph 03/09/2022 FINDINGS: Right neck catheter tip overlies the superior cavoatrial junction. Unchanged cardiomediastinal silhouette. There is no focal airspace consolidation. There is no pleural effusion. There is no pneumothorax. There is no acute osseous abnormality. Skin fold overlies the left lower chest. IMPRESSION: No evidence of acute cardiopulmonary disease. Electronically Signed   By: Maurine Simmering M.D.   On: 06/12/2022 16:25    Pending Labs Unresulted Labs (From admission, onward)     Start     Ordered   06/14/22 0500  CBC with Differential/Platelet  Tomorrow morning,   R        06/13/22 1118   06/14/22 6203  Basic metabolic panel  Tomorrow morning,   R        06/13/22 1118   06/13/22 1308  Hepatitis B surface antigen  (New Admission Hemo Labs (Hepatitis B))  Once,   R        06/13/22 1313   06/13/22 1308  Hepatitis B surface antibody  (New Admission [facility] (Hepatitis B))  Once,   R        06/13/22 1313   06/13/22 1308  Hepatitis B surface antibody,quantitative  (New Admission [facility] (Hepatitis B))  Once,   R        06/13/22 1313   06/13/22 1308  Hepatitis B core antibody, total  (New Admission [facility] (Hepatitis B))  Once,   R        06/13/22 1313   06/13/22 1308  Hepatitis C antibody  (New Admission [facility] (Hepatitis B))  Once,   R        06/13/22 1313            Vitals/Pain Today's Vitals   06/13/22 0800 06/13/22 0940 06/13/22 1122 06/13/22 1400  BP: (!) 148/84  (!) 161/91 (!) 204/109  Pulse: 88 95 87   Resp: (!) 25  20   Temp:      TempSrc:      SpO2: 96%  99%   PainSc:        Isolation Precautions No active isolations  Medications Medications  pantoprazole (PROTONIX) EC tablet 40 mg (40 mg Oral Given 06/13/22 0940)  sevelamer carbonate (RENVELA) tablet 1,600 mg (1,600 mg Oral Patient Refused/Not Given 06/13/22 1121)  sodium zirconium cyclosilicate (LOKELMA) packet 10  g (10 g Oral Given 06/13/22 0939)  metoprolol tartrate (LOPRESSOR) tablet 25 mg (25 mg Oral Given 06/13/22 0940)  oxyCODONE (Oxy IR/ROXICODONE) immediate release tablet 5 mg (5 mg Oral Given 06/13/22 0232)  HYDROmorphone (DILAUDID) injection 0.5 mg (has no administration in time range)  acetaminophen (TYLENOL) tablet 650 mg (has no administration in time range)  prochlorperazine (COMPAZINE) injection 10 mg (has no administration in time range)  vancomycin variable dose per unstable renal function (pharmacist dosing) (has no administration in time range)  Chlorhexidine Gluconate Cloth 2 % PADS 6 each (  has no administration in time range)  hydrALAZINE (APRESOLINE) injection 10 mg (10 mg Intravenous Given 06/13/22 1400)  iohexol (OMNIPAQUE) 300 MG/ML solution 100 mL (100 mLs Intravenous Contrast Given 06/12/22 2234)  vancomycin (VANCOREADY) IVPB 1250 mg/250 mL (0 mg Intravenous Stopped 06/13/22 1223)    Mobility non-ambulatory High fall risk   Focused Assessments     R Recommendations: See Admitting Provider Note  Report given to:   Additional Notes:

## 2022-06-13 NOTE — Assessment & Plan Note (Signed)
Patient awake and alert, non focal BUN is 91 and patient is slow to respond to questions, possible mild uremic symptoms Continue neuro checks per unit protocol, follow with nephrology for renal replacement therapy.

## 2022-06-13 NOTE — Hospital Course (Addendum)
71 year old woman PMH including ESRD presented with nausea, vomiting, diarrhea, admitted for generalized abdominal pain, gallbladder mass, hyperkalemia and acute metabolic encephalopathy. Found to have MRSA bacteremia, seen by ID with ongoing IV antibiotics.  Seen by general surgery and underwent radical cholecystectomy 7/21.  Specimen suspicious for malignancy.

## 2022-06-13 NOTE — Assessment & Plan Note (Signed)
Uncontrolled hypertension, continue blood pressure control with metoprolol. Patient will need ultrafiltration on hemodialysis.

## 2022-06-13 NOTE — Progress Notes (Signed)
   06/13/22 2010  Assess: MEWS Score  Temp 100.3 F (37.9 C)  BP 124/76  Pulse Rate (!) 112  ECG Heart Rate (!) 163  Resp 18  Level of Consciousness Alert  SpO2 96 %  O2 Device Room Air  Assess: MEWS Score  MEWS Temp 0  MEWS Systolic 0  MEWS Pulse 3  MEWS RR 0  MEWS LOC 0  MEWS Score 3  MEWS Score Color Yellow  Assess: if the MEWS score is Yellow or Red  Were vital signs taken at a resting state? Yes  Focused Assessment No change from prior assessment  Does the patient meet 2 or more of the SIRS criteria? No  MEWS guidelines implemented *See Row Information* Yes  Treat  Pain Scale 0-10  Pain Score 0  Take Vital Signs  Increase Vital Sign Frequency  Yellow: Q 2hr X 2 then Q 4hr X 2, if remains yellow, continue Q 4hrs  Escalate  MEWS: Escalate Yellow: discuss with charge nurse/RN and consider discussing with provider and RRT  Notify: Charge Nurse/RN  Name of Charge Nurse/RN Notified Carmell Austria F  Date Charge Nurse/RN Notified 06/13/22  Time Charge Nurse/RN Notified 2026  Notify: Provider  Provider Name/Title Rathore MD  Date Provider Notified 06/13/22  Time Provider Notified 2017  Method of Notification Page  Notification Reason Change in status (HR ST)  Provider response See new orders  Date of Provider Response 06/13/22  Time of Provider Response 2026  Document  Patient Outcome Not stable and remains on department  Progress note created (see row info) Yes  Assess: SIRS CRITERIA  SIRS Temperature  0  SIRS Pulse 1  SIRS Respirations  0  SIRS WBC 0  SIRS Score Sum  1

## 2022-06-13 NOTE — Consult Note (Signed)
[facility]    Date of Admission:  06/12/2022     Reason for Consult: mrsa bacteremia    Referring Provider: Earna Coder   Lines:  Right hd tunneled catheter  Abx: 7/9-c vanc       7/9 ceftriaxone  Assessment: 71 yo female aflutter on eliquis, right atrial mass/thrombus discovered 12/2021 during mrsa septicemia and also mri l5-s1 discitis s/p 8 weeks vancomycin (lost to id f/u), esrd on hd tts, hx endocarditis, admitted 7/8 with generalized abd pain/malaise, n/v/diarrhea of 2 days found to have mrsa septicemia  Suspect line infection which is most common with hd, or if other sources then line complication  So far she is not complaining of other bone/joint pain and there is no tenderness on exam of spine or peripheral joint.   Given prior endocarditis will need to get tee though to r/o recurrent or new endocarditis   So far no lower back tenderness or pain on exam/history  Plan: Will repeat blood culture tomorrow Continue vanc Tte; if negative would still need tee The dialysis catheter will need to be removed; can coordinate with nephrology to remove and given a 48-72 hours line holiday to clear bacteremia Discussed with primary team   I spent 75 minute reviewing data/chart, and coordinating care and >50% direct face to face time providing counseling/discussing diagnostics/treatment plan with patient   ------------------------------------------------ Principal Problem:   Gallbladder mass Active Problems:   Hypertension   MRSA bacteremia   ESRD on dialysis ([place])   Paroxysmal atrial flutter ([place])   Acute metabolic encephalopathy    HPI: ROMELIA BROMELL is a 71 y.o. female esr with tts iHD via right chest hd cath  She has mrsa bacteremia 12/18/2021 -- reviewed note Tte mitral valve thickening Tee not visualized the mv changes but reports right atrial/ivc mobile density Mri with l5-s1 discitis/om Right IJ HD cath removed and  replaced after line holiday Given 8 weeks vanc with dialysis; lost to id follow up   Hx via chart and patient Patient is rather lethargic and can't given good history   It appears she has 2 days n/v/diarrhea and abd pain so came for admission  She denies headache, cough, chest pain, sob, joint pain/back pain  She dialyzes via right chest HD catheter  On admission: Febrile Hypertensive Saturating well on room air Bcx returned within 24 hours 2 of 2 sets mrsa She was started promptly on vancomycin   Ct abd mentioned stable gall bladder fundal mass and chronic L4-5 changes   Family History  Problem Relation Age of Onset   Hypertension Mother    Hypertension Father    Colon cancer Brother    Lung cancer Brother     Social History   Tobacco Use   Smoking status: Never    Passive exposure: Never   Smokeless tobacco: Never  Vaping Use   Vaping Use: Never used  Substance Use Topics   Alcohol use: No   Drug use: No    Allergies  Allergen Reactions   Shellfish Allergy     Unknown reaction     Review of Systems: ROS All Other ROS was negative, except mentioned above   Past Medical History:  Diagnosis Date   Anemia    Asthma    Atrial flutter with rapid ventricular response ([place]) 12/25/2021   Chronic kidney disease    dialysis Tues Thurs Sat   Dysrhythmia    PAF 10/2019  in setting of COVID-19 PNA   Gout    Hypertension        Scheduled Meds:  metoprolol tartrate  25 mg Oral BID   pantoprazole  40 mg Oral BID   sevelamer carbonate  1,600 mg Oral TID WC   sodium zirconium cyclosilicate  10 g Oral TID   vancomycin variable dose per unstable renal function (pharmacist dosing)   Does not apply See admin instructions   Continuous Infusions:  cefTRIAXone (ROCEPHIN)  IV 2 g (06/13/22 1227)   PRN Meds:.acetaminophen, HYDROmorphone (DILAUDID) injection, oxyCODONE, prochlorperazine   OBJECTIVE: Blood pressure (!) 161/91, pulse 87, temperature 98.3 F  (36.8 C), temperature source Oral, resp. rate 20, SpO2 99 %.  Physical Exam  General/constitutional: lethargic, open eyes to voice but can't sustain for more than a few seconds and unable to give any good history outside of yes/no questions HEENT: Normocephalic, PER, Conj Clear, EOMI, Oropharynx clear Neck supple CV: rrr no mrg Lungs: clear to auscultation, normal respiratory effort Abd: Soft, Nontender Ext: no edema Skin: No Rash Neuro: lethargic, generalized weakness; no tremor/rigidity MSK: no peripheral joint swelling/tenderness/warmth; back spines nontender   Central line presence: right chest tunneled catheter site no purulence/tenderness   Lab Results Lab Results  Component Value Date   WBC 7.5 06/13/2022   HGB 11.2 (L) 06/13/2022   HCT 34.7 (L) 06/13/2022   MCV 98.6 06/13/2022   PLT 136 (L) 06/13/2022    Lab Results  Component Value Date   CREATININE 12.08 (H) 06/13/2022   BUN 94 (H) 06/13/2022   NA 129 (L) 06/13/2022   K 5.0 06/13/2022   CL 97 (L) 06/13/2022   CO2 14 (L) 06/13/2022    Lab Results  Component Value Date   ALT 10 06/12/2022   AST 16 06/12/2022   ALKPHOS 56 06/12/2022   BILITOT 1.1 06/12/2022      Microbiology: Recent Results (from the past 240 hour(s))  Resp Panel by RT-PCR (Flu A&B, Covid) Anterior Nasal Swab     Status: None   Collection Time: 06/12/22  3:08 PM   Specimen: Anterior Nasal Swab  Result Value Ref Range Status   SARS Coronavirus 2 by RT PCR NEGATIVE NEGATIVE Final    Comment: (NOTE) SARS-CoV-2 target nucleic acids are NOT DETECTED.  The SARS-CoV-2 RNA is generally detectable in upper respiratory specimens during the acute phase of infection. The lowest concentration of SARS-CoV-2 viral copies this assay can detect is 138 copies/mL. A negative result does not preclude SARS-Cov-2 infection and should not be used as the sole basis for treatment or other patient management decisions. A negative result may occur with   improper specimen collection/handling, submission of specimen other than nasopharyngeal swab, presence of viral mutation(s) within the areas targeted by this assay, and inadequate number of viral copies(<138 copies/mL). A negative result must be combined with clinical observations, patient history, and epidemiological information. The expected result is Negative.  Fact Sheet for Patients:  EntrepreneurPulse.com.au  Fact Sheet for Healthcare Providers:  IncredibleEmployment.be  This test is no t yet approved or cleared by the [place] FDA and  has been authorized for detection and/or diagnosis of SARS-CoV-2 by FDA under an Emergency Use Authorization (EUA). This EUA will remain  in effect (meaning this test can be used) for the duration of the COVID-19 declaration under Section 564(b)(1) of the Act, 21 U.S.C.section 360bbb-3(b)(1), unless the authorization is terminated  or revoked sooner.       Influenza A by PCR NEGATIVE  NEGATIVE Final   Influenza B by PCR NEGATIVE NEGATIVE Final    Comment: (NOTE) The Xpert Xpress SARS-CoV-2/FLU/RSV plus assay is intended as an aid in the diagnosis of influenza from Nasopharyngeal swab specimens and should not be used as a sole basis for treatment. Nasal washings and aspirates are unacceptable for Xpert Xpress SARS-CoV-2/FLU/RSV testing.  Fact Sheet for Patients: EntrepreneurPulse.com.au  Fact Sheet for Healthcare Providers: IncredibleEmployment.be  This test is not yet approved or cleared by the [place] FDA and has been authorized for detection and/or diagnosis of SARS-CoV-2 by FDA under an Emergency Use Authorization (EUA). This EUA will remain in effect (meaning this test can be used) for the duration of the COVID-19 declaration under Section 564(b)(1) of the Act, 21 U.S.C. section 360bbb-3(b)(1), unless the authorization is terminated  or revoked.  Performed at [facility], [place] [address], [place], [place] [address]   Blood Culture (routine x 2)     Status: None (Preliminary result)   Collection Time: 06/12/22  3:11 PM   Specimen: BLOOD LEFT HAND  Result Value Ref Range Status   Specimen Description BLOOD LEFT HAND  Final   Special Requests   Final    BOTTLES DRAWN AEROBIC ONLY Blood Culture results may not be optimal due to an inadequate volume of blood received in culture bottles   Culture  Setup Time   Final    GRAM POSITIVE COCCI IN CLUSTERS AEROBIC BOTTLE ONLY CRITICAL VALUE NOTED.  VALUE IS CONSISTENT WITH PREVIOUSLY REPORTED AND CALLED VALUE. Performed at [facility], [place] [address], [place], [place] [address]    Culture GRAM POSITIVE COCCI  Final   Report Status PENDING  Incomplete  Blood Culture (routine x 2)     Status: None (Preliminary result)   Collection Time: 06/12/22  3:45 PM   Specimen: BLOOD  Result Value Ref Range Status   Specimen Description BLOOD SITE NOT SPECIFIED  Final   Special Requests   Final    BOTTLES DRAWN AEROBIC AND ANAEROBIC Blood Culture results may not be optimal due to an inadequate volume of blood received in culture bottles   Culture  Setup Time   Final    GRAM POSITIVE COCCI IN BOTH AEROBIC AND ANAEROBIC BOTTLES CRITICAL RESULT CALLED TO, READ BACK BY AND VERIFIED WITH: PHARMD M.MACCIA AT 1914 ON 06/13/2022 BY T.SAAD. Performed at [facility], [place] [address] [place], [place] [address]    Culture GRAM POSITIVE COCCI  Final   Report Status PENDING  Incomplete  Blood Culture ID Panel (Reflexed)     Status: Abnormal   Collection Time: 06/12/22  3:45 PM  Result Value Ref Range Status   Enterococcus faecalis NOT DETECTED NOT DETECTED Final   Enterococcus Faecium NOT DETECTED NOT DETECTED Final   Listeria monocytogenes NOT DETECTED NOT DETECTED Final   Staphylococcus species DETECTED (A) NOT DETECTED Final    Comment: CRITICAL RESULT CALLED TO, READ  BACK BY AND VERIFIED WITH: PHARMD M.MACCIA AT 0750 ON 06/13/2022 BY T.SAAD.    Staphylococcus aureus (BCID) DETECTED (A) NOT DETECTED Final    Comment: Methicillin (oxacillin)-resistant Staphylococcus aureus (MRSA). MRSA is predictably resistant to beta-lactam antibiotics (except ceftaroline). Preferred therapy is vancomycin unless clinically contraindicated. Patient requires contact precautions if  hospitalized. CRITICAL RESULT CALLED TO, READ BACK BY AND VERIFIED WITH: PHARMD M.MACCIA AT 0750 ON 06/13/2022 BY T.SAAD.    Staphylococcus epidermidis NOT DETECTED NOT DETECTED Final   Staphylococcus lugdunensis NOT DETECTED NOT DETECTED Final  Streptococcus species NOT DETECTED NOT DETECTED Final   Streptococcus agalactiae NOT DETECTED NOT DETECTED Final   Streptococcus pneumoniae NOT DETECTED NOT DETECTED Final   Streptococcus pyogenes NOT DETECTED NOT DETECTED Final   A.calcoaceticus-baumannii NOT DETECTED NOT DETECTED Final   Bacteroides fragilis NOT DETECTED NOT DETECTED Final   Enterobacterales NOT DETECTED NOT DETECTED Final   Enterobacter cloacae complex NOT DETECTED NOT DETECTED Final   Escherichia coli NOT DETECTED NOT DETECTED Final   Klebsiella aerogenes NOT DETECTED NOT DETECTED Final   Klebsiella oxytoca NOT DETECTED NOT DETECTED Final   Klebsiella pneumoniae NOT DETECTED NOT DETECTED Final   Proteus species NOT DETECTED NOT DETECTED Final   Salmonella species NOT DETECTED NOT DETECTED Final   Serratia marcescens NOT DETECTED NOT DETECTED Final   Haemophilus influenzae NOT DETECTED NOT DETECTED Final   Neisseria meningitidis NOT DETECTED NOT DETECTED Final   Pseudomonas aeruginosa NOT DETECTED NOT DETECTED Final   Stenotrophomonas maltophilia NOT DETECTED NOT DETECTED Final   Candida albicans NOT DETECTED NOT DETECTED Final   Candida auris NOT DETECTED NOT DETECTED Final   Candida glabrata NOT DETECTED NOT DETECTED Final   Candida krusei NOT DETECTED NOT DETECTED Final    Candida parapsilosis NOT DETECTED NOT DETECTED Final   Candida tropicalis NOT DETECTED NOT DETECTED Final   Cryptococcus neoformans/gattii NOT DETECTED NOT DETECTED Final   Meth resistant mecA/C and MREJ DETECTED (A) NOT DETECTED Final    Comment: CRITICAL RESULT CALLED TO, READ BACK BY AND VERIFIED WITH: PHARMD M.MACCIA AT 0750 ON 06/13/2022 BY T.SAAD. Performed at [facility], [place] [address], [place], [place] [address]      Serology:    Imaging: If present, new imagings (plain films, ct scans, and mri) have been personally visualized and interpreted; radiology reports have been reviewed. Decision making incorporated into the Impression / Recommendations.  7/8 abd pelv ct Cholelithiasis with wall thickening in the gallbladder as well as a gallbladder fundal mass suspicious for carcinoma. These changes are stable from prior MR and CT.   Nonobstructing renal calculi on the right.   Chronic changes at L4-L5 likely related to prior discitis.   7/8 ct head Chronic ischemic changes  Jabier Mutton, [facility] [place] [phone] pager    06/13/2022, 12:48 PM

## 2022-06-13 NOTE — Progress Notes (Signed)
PHARMACY - PHYSICIAN COMMUNICATION CRITICAL VALUE ALERT - BLOOD CULTURE IDENTIFICATION (BCID)  Diamond [name] is an 71 y.o. female who presented to [facility] on [facility]/12/2022   Assessment:  MRSA Bacteremia  Name of physician (or Provider) Contacted: Dr Cathlean Sauer, Dr Gale Journey  Current antibiotics: None  Changes to prescribed antibiotics recommended:  Vanc 1250 mg x 1 Further dosing based on HD plan  Results for orders placed or performed during the hospital encounter of 06/12/22  Blood Culture ID Panel (Reflexed) (Collected: 06/12/2022  3:45 PM)  Result Value Ref Range   Enterococcus faecalis NOT DETECTED NOT DETECTED   Enterococcus Faecium NOT DETECTED NOT DETECTED   Listeria monocytogenes NOT DETECTED NOT DETECTED   Staphylococcus species DETECTED (A) NOT DETECTED   Staphylococcus aureus (BCID) DETECTED (A) NOT DETECTED   Staphylococcus epidermidis NOT DETECTED NOT DETECTED   Staphylococcus lugdunensis NOT DETECTED NOT DETECTED   Streptococcus species NOT DETECTED NOT DETECTED   Streptococcus agalactiae NOT DETECTED NOT DETECTED   Streptococcus pneumoniae NOT DETECTED NOT DETECTED   Streptococcus pyogenes NOT DETECTED NOT DETECTED   A.calcoaceticus-baumannii NOT DETECTED NOT DETECTED   Bacteroides fragilis NOT DETECTED NOT DETECTED   Enterobacterales NOT DETECTED NOT DETECTED   Enterobacter cloacae complex NOT DETECTED NOT DETECTED   Escherichia coli NOT DETECTED NOT DETECTED   Klebsiella aerogenes NOT DETECTED NOT DETECTED   Klebsiella oxytoca NOT DETECTED NOT DETECTED   Klebsiella pneumoniae NOT DETECTED NOT DETECTED   Proteus species NOT DETECTED NOT DETECTED   Salmonella species NOT DETECTED NOT DETECTED   Serratia marcescens NOT DETECTED NOT DETECTED   Haemophilus influenzae NOT DETECTED NOT DETECTED   Neisseria meningitidis NOT DETECTED NOT DETECTED   Pseudomonas aeruginosa NOT DETECTED NOT DETECTED   Stenotrophomonas maltophilia NOT DETECTED NOT DETECTED   Candida albicans NOT  DETECTED NOT DETECTED   Candida auris NOT DETECTED NOT DETECTED   Candida glabrata NOT DETECTED NOT DETECTED   Candida krusei NOT DETECTED NOT DETECTED   Candida parapsilosis NOT DETECTED NOT DETECTED   Candida tropicalis NOT DETECTED NOT DETECTED   Cryptococcus neoformans/gattii NOT DETECTED NOT DETECTED   Meth resistant mecA/C and MREJ DETECTED (A) NOT DETECTED   Barth Kirks, PharmD, BCCCP Clinical Pharmacist [phone]  Please check AMION for all [facility] numbers  06/13/2022 7:58 AM

## 2022-06-13 NOTE — Assessment & Plan Note (Addendum)
Hyponatremia, hyperkalemia.  Patient has missed 2 sessions of hemodialysis.  Plan to consult nephrology to resume renal replacement therapy. She has a right upper extremity fistula, not clear if it is functional.   Continue with sodium zirconium for hyperkalemia.  May need to remove HD cathter.   Anemia of chronic renal disease, hgb has been stable.

## 2022-06-13 NOTE — Assessment & Plan Note (Signed)
Patient with 3/4 bottles with positive bacteremia staphylococcus aureus MRSA.  Plan to continue antibiotic therapy with IV vancomycin HD catheter will need to be removed and patient will need TEE.  Will discuss with nephrology .

## 2022-06-13 NOTE — ED Notes (Signed)
Pt unable to provide consent for dialysis treatment. Son, Layla Kesling was called to consent to treatment with Darliss Ridgel. Consent at bedside.

## 2022-06-13 NOTE — Assessment & Plan Note (Signed)
Continue rate control with metoprolol Currently patient on sinus rhythm.  Plan to hold on apixaban, because patient may need a new HD access.

## 2022-06-13 NOTE — Progress Notes (Signed)
Progress Note   Patient: Diamond [name] WNI:[id number] DOB: March 14, 1951 DOA: 06/12/2022     1 DOS: the patient was seen and examined on 06/13/2022   Brief hospital course: Mrs. Diamond [name] was admitted to the hospital with the working diagnosis of abdominal pain.   71 yo female with the past medical history of ESRD on HD, anemia, hypertension and hx MRSA  L5 S1 discitis and endocarditis who presented with abdominal pain, associated with diarrhea, nausea and vomiting for 2 days.  Patient has missed 2 HD sessions.  On her initial physical examination her temp was 100.4 BP 170/99, HR 90 and RR 16, 02 saturation 100%. Lungs with no wheezing or rales, heart with S1 and S2 present and rhythmic, abdomen not distended or tender and no lower extremity edema.   Na 129, K 5,6 CL 94, bicarbonate 16, glucose 90 bun 91 cr 11,7  Lipase 52 AST 16 ALT 10  High sensitive troponin 19   Wbc 8,6 hgb 12,7 plt 157  Blood cultures 3/4 bottles staphylococcus aureus MRSA.   Urine analysis SG 1,006 0-5 wbc, 30 protein   Head CT no acute changes.  CT abdomen and pelvis cholelithiasis with wall thickening in the gallbladder as well as a gallbladder fundal mass suspicious for carcinoma (stable changes from prior CT and MR).   Chest radiograph with mild cardiomegaly, no infiltrates, positive HD cathter in the IJ vein with tip at the SVC junction.   EKG 76 bpm, normal axis, right bundle branch block, normal qtc, sinus rhythm with no significant ST segment or T wave changes.   Patient has been placed on antibiotic therapy.   Assessment and Plan: * Gallbladder mass Patient with no nausea or vomiting.  Percell Miller sign is negative, patient continue afebrile and no leukocytosis.   Doubt acute cholecystitis.  Old records personally reviewed, gallbladder mass present since 2022 and per imaging has been stable.   Plan continue with empiric antibiotic therapy for now, if no further signs of gallbladder infection, to dc  ceftriaxone.  May need further work up with abdominal MRI.    MRSA bacteremia Patient with 3/4 bottles with positive bacteremia staphylococcus aureus MRSA.  Plan to continue antibiotic therapy with IV vancomycin HD catheter will need to be removed and patient will need TEE.  Will discuss with nephrology .   Hypertension Uncontrolled hypertension, continue blood pressure control with metoprolol. Patient will need ultrafiltration on hemodialysis.   ESRD on dialysis ([facility]) Hyponatremia, hyperkalemia.  Patient has missed 2 sessions of hemodialysis.  Plan to consult nephrology to resume renal replacement therapy. She has a right upper extremity fistula, not clear if it is functional.   Continue with sodium zirconium for hyperkalemia.  May need to remove HD cathter.   Anemia of chronic renal disease, hgb has been stable.   Paroxysmal atrial flutter (HCC) Continue rate control with metoprolol Currently patient on sinus rhythm.  Plan to hold on apixaban, because patient may need a new HD access.   Acute metabolic encephalopathy Patient awake and alert, non focal BUN is 91 and patient is slow to respond to questions, possible mild uremic symptoms Continue neuro checks per unit protocol, follow with nephrology for renal replacement therapy.         Subjective: Patient with abdominal pain, mild to moderate with no nausea or vomiting, no chest pain or dyspnea.   Physical Exam: Vitals:   06/13/22 0600 06/13/22 0730 06/13/22 0800 06/13/22 0940  BP: (!) 162/81  (!) 148/84  Pulse: 86 89 88 95  Resp: (!) 33 19 (!) 25   Temp:      TempSrc:      SpO2: 96% 96% 96%    Neurology awake and alert, slow to respond to questions but non focal ENT with mild pallor and no icterus Cardiovascular with S1 and S2 present and rhythmic with no rubs or gallops, no murmurs Respiratory with no rales or wheezing Abdomen not distended, tender to deep palpation, murphy sign is negative Lower  extremities no edema Skin no rashes Right IJ HD cathter with no erythema or local edema Right upper extremity fistula with bruit present  Data Reviewed:    Family Communication: no family at the bedside   Disposition: Status is: Inpatient Remains inpatient appropriate because: IV antibiotic therapy   Planned Discharge Destination: Home      Author: Tawni Millers, MD 06/13/2022 11:10 AM  For on call review [email or web address].

## 2022-06-13 NOTE — Assessment & Plan Note (Addendum)
Patient with no nausea or vomiting.  Diamond [name] sign is negative, patient continue afebrile and no leukocytosis.   Doubt acute cholecystitis.  Old records personally reviewed, gallbladder mass present since 2022 and per imaging has been stable.   Plan continue with empiric antibiotic therapy for now, if no further signs of gallbladder infection, to dc ceftriaxone.  May need further work up with abdominal MRI.

## 2022-06-13 NOTE — ED Notes (Signed)
Pt soiled. Pt linens and gown changed. This RN attempted to help assist patient with lunch, but pt kept falling asleep. Systolic B/P also noted to be in the 355'E and diastolic > 174. MD Mauricio made aware.

## 2022-06-14 DIAGNOSIS — B9562 Methicillin resistant Staphylococcus aureus infection as the cause of diseases classified elsewhere: Secondary | ICD-10-CM | POA: Diagnosis not present

## 2022-06-14 DIAGNOSIS — I4892 Unspecified atrial flutter: Secondary | ICD-10-CM | POA: Diagnosis not present

## 2022-06-14 DIAGNOSIS — R7881 Bacteremia: Secondary | ICD-10-CM | POA: Diagnosis not present

## 2022-06-14 DIAGNOSIS — Z992 Dependence on renal dialysis: Secondary | ICD-10-CM | POA: Diagnosis not present

## 2022-06-14 DIAGNOSIS — N186 End stage renal disease: Secondary | ICD-10-CM | POA: Diagnosis not present

## 2022-06-14 LAB — CBC WITH DIFFERENTIAL/PLATELET
Abs Immature Granulocytes: 0.08 10*3/uL — ABNORMAL HIGH (ref 0.00–0.07)
Basophils Absolute: 0 10*3/uL (ref 0.0–0.1)
Basophils Relative: 0 %
Eosinophils Absolute: 0 10*3/uL (ref 0.0–0.5)
Eosinophils Relative: 0 %
HCT: 33.3 % — ABNORMAL LOW (ref 36.0–46.0)
Hemoglobin: 11.6 g/dL — ABNORMAL LOW (ref 12.0–15.0)
Immature Granulocytes: 1 %
Lymphocytes Relative: 5 %
Lymphs Abs: 0.6 10*3/uL — ABNORMAL LOW (ref 0.7–4.0)
MCH: 32.9 pg (ref 26.0–34.0)
MCHC: 34.8 g/dL (ref 30.0–36.0)
MCV: 94.3 fL (ref 80.0–100.0)
Monocytes Absolute: 1 10*3/uL (ref 0.1–1.0)
Monocytes Relative: 8 %
Neutro Abs: 10.2 10*3/uL — ABNORMAL HIGH (ref 1.7–7.7)
Neutrophils Relative %: 86 %
Platelets: 150 10*3/uL (ref 150–400)
RBC: 3.53 MIL/uL — ABNORMAL LOW (ref 3.87–5.11)
RDW: 18.3 % — ABNORMAL HIGH (ref 11.5–15.5)
WBC: 11.9 10*3/uL — ABNORMAL HIGH (ref 4.0–10.5)
nRBC: 0 % (ref 0.0–0.2)

## 2022-06-14 LAB — BASIC METABOLIC PANEL
Anion gap: 25 — ABNORMAL HIGH (ref 5–15)
BUN: 43 mg/dL — ABNORMAL HIGH (ref 8–23)
CO2: 18 mmol/L — ABNORMAL LOW (ref 22–32)
Calcium: 9 mg/dL (ref 8.9–10.3)
Chloride: 92 mmol/L — ABNORMAL LOW (ref 98–111)
Creatinine, Ser: 7.05 mg/dL — ABNORMAL HIGH (ref 0.44–1.00)
GFR, Estimated: 6 mL/min — ABNORMAL LOW (ref >60)
Glucose, Bld: 63 mg/dL — ABNORMAL LOW (ref 70–99)
Potassium: 3.9 mmol/L (ref 3.5–5.1)
Sodium: 135 mmol/L (ref 135–145)

## 2022-06-14 LAB — LACTIC ACID, PLASMA: Lactic Acid, Venous: 1 mmol/L (ref 0.5–1.9)

## 2022-06-14 LAB — APTT: aPTT: 35 seconds (ref 24–36)

## 2022-06-14 LAB — TSH: TSH: 5.093 u[IU]/mL — ABNORMAL HIGH (ref 0.350–4.500)

## 2022-06-14 LAB — HEPARIN LEVEL (UNFRACTIONATED): Heparin Unfractionated: >1.1 [IU]/mL — ABNORMAL HIGH (ref 0.30–0.70)

## 2022-06-14 MED ORDER — LIDOCAINE HCL (PF) 2 % IJ SOLN
0.0000 mL | Freq: Once | INTRAMUSCULAR | Status: DC | PRN
  Filled 2022-06-14 (×2): qty 20

## 2022-06-14 MED ORDER — VANCOMYCIN HCL 500 MG/100ML IV SOLN
500.0000 mg | INTRAVENOUS | Status: AC
  Administered 2022-06-14: 500 mg via INTRAVENOUS
  Filled 2022-06-14: qty 100

## 2022-06-14 MED ORDER — HEPARIN (PORCINE) 25000 UT/250ML-% IV SOLN
800.0000 [IU]/h | INTRAVENOUS | Status: DC
  Administered 2022-06-15: 800 [IU]/h via INTRAVENOUS
  Filled 2022-06-14: qty 250

## 2022-06-14 MED ORDER — DILTIAZEM HCL-DEXTROSE 125-5 MG/125ML-% IV SOLN (PREMIX)
5.0000 mg/h | INTRAVENOUS | Status: DC
  Administered 2022-06-14: 5 mg/h via INTRAVENOUS
  Administered 2022-06-14 (×2): 12.5 mg/h via INTRAVENOUS
  Administered 2022-06-15: 15 mg/h via INTRAVENOUS
  Administered 2022-06-15: 5 mg/h via INTRAVENOUS
  Filled 2022-06-14 (×7): qty 125

## 2022-06-14 MED ORDER — DILTIAZEM HCL 25 MG/5ML IV SOLN
10.0000 mg | Freq: Once | INTRAVENOUS | Status: AC
  Administered 2022-06-14: 10 mg via INTRAVENOUS
  Filled 2022-06-14: qty 5

## 2022-06-14 MED ORDER — HEPARIN (PORCINE) 25000 UT/250ML-% IV SOLN
800.0000 [IU]/h | INTRAVENOUS | Status: DC
Start: 2022-06-14 — End: 2022-06-14

## 2022-06-14 MED ORDER — CHLORHEXIDINE GLUCONATE CLOTH 2 % EX PADS
6.0000 | MEDICATED_PAD | Freq: Every day | CUTANEOUS | Status: DC
  Administered 2022-06-15 – 2022-06-16 (×2): 6 via TOPICAL

## 2022-06-14 NOTE — Consult Note (Signed)
CARDIOLOGY CONSULT NOTE       Patient ID: Diamond [name] MRN: [id number] DOB/AGE: 73-Sep-1952 73 y.o.  Admit date: 06/12/2022 Referring Physician: Candiss Norse Primary Physician: [name], MD Primary Cardiologist: Johney Frame Reason for Consultation: SBE/Afib  Principal Problem:   MRSA bacteremia Active Problems:   Hypertension   Gallbladder mass   ESRD on dialysis [facility])   Paroxysmal atrial flutter ([place])   Acute metabolic encephalopathy   HPI:  71 y.o. hospitalized 7/8 with abdominal pain, nausea vomiting and diarrhea. History of GB tumor. She has a history of PAF/flutter on eliquis. Hemodialysis December 22, 2021 had TEE with mixed thrombus/vegetation on dialysis catheter. Not clear why not removed then but now has functioning LUE fistula. She has been followed by ID for MRSA bacteremia with positive BC;s again 06/12/22 with L5S1 discitis. She had 8 weeks of Vanc January -March.  She is unaware of her afib. Has been on eliquis Rates up currently   ROS All other systems reviewed and negative except as noted above  Past Medical History:  Diagnosis Date   Anemia    Asthma    Atrial flutter with rapid ventricular response ([place]) 12/25/2021   Chronic kidney disease    dialysis Tues Thurs Sat   Dysrhythmia    PAF 10/2019 in setting of COVID-19 PNA   Gout    Hypertension     Family History  Problem Relation Age of Onset   Hypertension Mother    Hypertension Father    Colon cancer Brother    Lung cancer Brother     Social History   Socioeconomic History   Marital status: Widowed    Spouse name: Not on file   Number of children: Not on file   Years of education: Not on file   Highest education level: Not on file  Occupational History   Not on file  Tobacco Use   Smoking status: Never    Passive exposure: Never   Smokeless tobacco: Never  Vaping Use   Vaping Use: Never used  Substance and Sexual Activity   Alcohol use: No   Drug use: No   Sexual activity: Not  Currently    Birth control/protection: Post-menopausal  Other Topics Concern   Not on file  Social History Narrative   Not on file   Social Determinants of Health   Financial Resource Strain: Not on file  Food Insecurity: Not on file  Transportation Needs: Not on file  Physical Activity: Not on file  Stress: Not on file  Social Connections: Not on file  Intimate Partner Violence: Not on file    Past Surgical History:  Procedure Laterality Date   AV FISTULA PLACEMENT Left 12/23/2021   Procedure: LEFT ARM BRACHIOBASILIC VEIN ARTERIOVENOUS (AV) FISTULA CREATION;  Surgeon: [name], MD;  Location: [place];  Service: Vascular;  Laterality: Left;   [place] Left 03/08/2022   Procedure: LEFT SECOND STAGE BASILIC VEIN TRANSPOSITION;  Surgeon: [name], MD;  Location: [place];  Service: Vascular;  Laterality: Left;   BIOPSY  01/19/2022   Procedure: BIOPSY;  Surgeon: Lavena Bullion, DO;  Location: [place] ENDOSCOPY;  Service: Gastroenterology;;   [place]   COLONOSCOPY Left 01/19/2022   Procedure: COLONOSCOPY;  Surgeon: Lavena Bullion, DO;  Location: [place];  Service: Gastroenterology;  Laterality: Left;   ESOPHAGOGASTRODUODENOSCOPY Left 01/19/2022   Procedure: ESOPHAGOGASTRODUODENOSCOPY (EGD);  Surgeon: Lavena Bullion, DO;  Location[facility] ENDOSCOPY;  Service: Gastroenterology;  Laterality: Left;  INSERTION OF DIALYSIS CATHETER Right 12/23/2021   Procedure: INSERTION OF DIALYSIS CATHETER;  Surgeon: [name], MD;  Location: [place];  Service: Vascular;  Laterality: Right;   POLYPECTOMY  01/19/2022   Procedure: POLYPECTOMY;  Surgeon: Lavena Bullion, DO;  Location: [place] ENDOSCOPY;  Service: Gastroenterology;;   TEE WITHOUT CARDIOVERSION N/A 12/22/2021   Procedure: TRANSESOPHAGEAL ECHOCARDIOGRAM (TEE);  Surgeon: Jerline Pain, MD;  Location: [facility] ENDOSCOPY;  Service: Cardiovascular;  Laterality: N/A;   ULTRASOUND GUIDANCE FOR VASCULAR  ACCESS  12/23/2021   Procedure: ULTRASOUND GUIDANCE FOR VASCULAR ACCESS;  Surgeon: [name], MD;  Location: [facility] OR;  Service: Vascular;;      Current Facility-Administered Medications:    acetaminophen (TYLENOL) suppository 650 mg, 650 mg, Rectal, Q6H PRN, Shela Leff, MD, 650 mg at 06/13/22 2151   Chlorhexidine Gluconate Cloth 2 % PADS 6 each, 6 each, Topical, Q0600, Lynnda Child, PA-C, 6 each at 06/14/22 0500   [START ON 06/15/2022] Chlorhexidine Gluconate Cloth 2 % PADS 6 each, 6 each, Topical, Q0600, Roney Jaffe, MD   diltiazem (CARDIZEM) 125 mg in dextrose 5% 125 mL (1 mg/mL) infusion, 5-15 mg/hr, Intravenous, Titrated, Shela Leff, MD, Last Rate: 12.5 mL/hr at 06/14/22 1325, 12.5 mg/hr at 06/14/22 1325   hydrALAZINE (APRESOLINE) injection 10 mg, 10 mg, Intravenous, Q6H PRN, Arrien, Jimmy Picket, MD, 10 mg at 06/13/22 1400   HYDROmorphone (DILAUDID) injection 0.5 mg, 0.5 mg, Intravenous, Q4H PRN, Hall, Carole N, DO   lidocaine HCl (PF) (XYLOCAINE) 2 % injection 0-20 mL, 0-20 mL, Intradermal, Once PRN, Rhyne, Samantha J, PA-C   oxyCODONE (Oxy IR/ROXICODONE) immediate release tablet 5 mg, 5 mg, Oral, Q6H PRN, [place] Crane, Carole N, DO, 5 mg at 06/13/22 0232   pantoprazole (PROTONIX) EC tablet 40 mg, 40 mg, Oral, BID, Hall, Carole N, DO, 40 mg at 06/14/22 0801   prochlorperazine (COMPAZINE) injection 10 mg, 10 mg, Intravenous, Q6H PRN, [place] Crane, Carole N, DO   sevelamer carbonate (RENVELA) tablet 1,600 mg, 1,600 mg, Oral, TID WC, Hall, Carole N, DO, 1,600 mg at 06/14/22 0801   vancomycin (VANCOREADY) IVPB 500 mg/100 mL, 500 mg, Intravenous, Q M,W,F-HD, Thurnell Lose, MD   vancomycin variable dose per unstable renal function (pharmacist dosing), , Does not apply, See admin instructions, Arrien, Jimmy Picket, MD  Chlorhexidine Gluconate Cloth  6 each Topical Q0600   [START ON 06/15/2022] Chlorhexidine Gluconate Cloth  6 each Topical Q0600   pantoprazole  40 mg Oral  BID   sevelamer carbonate  1,600 mg Oral TID WC   vancomycin variable dose per unstable renal function (pharmacist dosing)   Does not apply See admin instructions    diltiazem (CARDIZEM) infusion 12.5 mg/hr (06/14/22 1325)   vancomycin      Physical Exam: Blood pressure 128/84, pulse (!) 119, temperature (!) 97.4 F (36.3 C), temperature source Oral, resp. rate 18, height '5\' 7"'$  (1.702 m), weight 57.3 kg, SpO2 96 %.   Chronically ill black female Right tunneled dialysis catheter LUE fistula with thrill SEM Lungs clear Abdomen benign No edema  Labs:   Lab Results  Component Value Date   WBC 11.9 (H) 06/14/2022   HGB 11.6 (L) 06/14/2022   HCT 33.3 (L) 06/14/2022   MCV 94.3 06/14/2022   PLT 150 06/14/2022    Recent Labs  Lab 06/12/22 1545 06/12/22 1649 06/14/22 0113  NA 129*   < > 135  K 5.6*   < > 3.9  CL 94*   < > 92*  CO2 16*   < > 18*  BUN 91*   < > 43*  CREATININE 11.73*   < > 7.05*  CALCIUM 9.3   < > 9.0  PROT 7.0  --   --   BILITOT 1.1  --   --   ALKPHOS 56  --   --   ALT 10  --   --   AST 16  --   --   GLUCOSE 90   < > 63*   < > = values in this interval not displayed.   Lab Results  Component Value Date   CKTOTAL 158 11/05/2019    Lab Results  Component Value Date   CHOL 188 02/23/2011   Lab Results  Component Value Date   HDL 84 02/23/2011   Lab Results  Component Value Date   LDLCALC 80 02/23/2011   Lab Results  Component Value Date   TRIG 122 02/23/2011   Lab Results  Component Value Date   CHOLHDL 2.2 Ratio 02/23/2011   No results found for: "LDLDIRECT"    Radiology: CT ABDOMEN PELVIS W CONTRAST  Result Date: 06/12/2022 CLINICAL DATA:  Abdominal pain and diarrhea for 2 days EXAM: CT ABDOMEN AND PELVIS WITH CONTRAST TECHNIQUE: Multidetector CT imaging of the abdomen and pelvis was performed using the standard protocol following bolus administration of intravenous contrast. RADIATION DOSE REDUCTION: This exam was performed according  to the departmental dose-optimization program which includes automated exposure control, adjustment of the mA and/or kV according to patient size and/or use of iterative reconstruction technique. CONTRAST:  139m OMNIPAQUE IOHEXOL 300 MG/ML  SOLN COMPARISON:  01/16/2022 CT, MRI from 01/17/2022 FINDINGS: Lower chest: No acute abnormality. Hepatobiliary: Liver is well visualized without focal mass lesion. Gallbladder is well distended. Known cholelithiasis is seen and stable. Wall thickening is noted similar to that seen as well. An enhancing mass is again seen in the region of the fundus measuring up to 2.8 cm again suspicious for gallbladder carcinoma. No biliary ductal dilatation is seen. Pancreas: Unremarkable. No pancreatic ductal dilatation or surrounding inflammatory changes. Spleen: Calcified granulomas are noted.  No mass lesion is seen. Adrenals/Urinary Tract: Adrenal glands are within normal limits. Kidneys demonstrate a normal enhancement pattern. Scattered cysts are again identified and stable. No follow-up is recommended. Cortical calcifications as well as nonobstructing stones are noted on the right. No obstructive changes are noted. The bladder is decompressed with wall thickening identified Stomach/Bowel: The appendix is well visualized and within normal limits. No obstructive or inflammatory changes of the colon are seen. Small bowel shows no obstructive changes. Stomach is fluid-filled. Vascular/Lymphatic: Aortic atherosclerosis. No enlarged abdominal or pelvic lymph nodes. Reproductive: Uterus and bilateral adnexa are unremarkable. Other: No abdominal wall hernia or abnormality. No abdominopelvic ascites. Musculoskeletal: Degenerative changes of lumbar spine are noted worst at L4-5 with endplate sclerosis and disc space widening likely related to prior discitis. The overall appearance is stable from the prior study. IMPRESSION: Cholelithiasis with wall thickening in the gallbladder as well as a  gallbladder fundal mass suspicious for carcinoma. These changes are stable from prior MR and CT. Nonobstructing renal calculi on the right. Chronic changes at L4-L5 likely related to prior discitis. Electronically Signed   By: MInez CatalinaM.D.   On: 06/12/2022 22:51   CT Head Wo Contrast  Result Date: 06/12/2022 CLINICAL DATA:  Altered mental status EXAM: CT HEAD WITHOUT CONTRAST TECHNIQUE: Contiguous axial images were obtained from the base of the skull through the vertex without  intravenous contrast. RADIATION DOSE REDUCTION: This exam was performed according to the departmental dose-optimization program which includes automated exposure control, adjustment of the mA and/or kV according to patient size and/or use of iterative reconstruction technique. COMPARISON:  None Available. FINDINGS: Brain: No evidence of acute infarction, hemorrhage, hydrocephalus, extra-axial collection or mass lesion/mass effect. Chronic atrophic and ischemic changes are noted. Vascular: No hyperdense vessel or unexpected calcification. Skull: Normal. Negative for fracture or focal lesion. Sinuses/Orbits: No acute finding. Other: None. IMPRESSION: Chronic atrophic and ischemic changes without acute abnormality. Electronically Signed   By: Inez Catalina M.D.   On: 06/12/2022 22:45   DG Chest 1 View  Result Date: 06/12/2022 CLINICAL DATA:  Questionable sepsis EXAM: CHEST  1 VIEW COMPARISON:  Radiograph 03/09/2022 FINDINGS: Right neck catheter tip overlies the superior cavoatrial junction. Unchanged cardiomediastinal silhouette. There is no focal airspace consolidation. There is no pleural effusion. There is no pneumothorax. There is no acute osseous abnormality. Skin fold overlies the left lower chest. IMPRESSION: No evidence of acute cardiopulmonary disease. Electronically Signed   By: Maurine Simmering M.D.   On: 06/12/2022 16:25    EKG: afib nonspecific ST changes    ASSESSMENT AND PLAN:   SBE/Bacteremia: MRSA with recurrent  positive BC;s. Not clear why dialysis catheter not changed in January. Discussed with [facility] Dr Carlis Abbott he will remove at bedside today She is getting her dialysis from fistula now Will try to arrange f/u TEE Wednesday after catheter removed in light of new bacteremia. She has had TEE before Risks including esophageal injury intubation discussed willing to proceed CRF:  Inpatient dialysis from fistula d/c line ID:  Multiple issues including discitis, GB ? Tumor likely line infection with bacteremia currently on Vanc with ID following Afib:  continue cardizem drip eliquis held on heparin for line removal can resume eliquis after procedure home dose 2.5 mg bid  Signed: Jenkins Rouge 06/14/2022, 1:50 PM

## 2022-06-14 NOTE — Progress Notes (Signed)
PROGRESS NOTE                                                                                                                                                                                                             Patient Demographics:    Diamond [name], is a 71 y.o. female, DOB - Oct 10, 1951, WRU:[id number]  Outpatient Primary MD for the patient is Osei-Bonsu, Iona Beard, MD    LOS - 2  Admit date - 06/12/2022    No chief complaint on file.      Brief Narrative (HPI from H&P)   71 yo female with the past medical history of ESRD on HD, anemia, hypertension and hx MRSA  L5 S1 discitis and endocarditis who presented with abdominal pain, associated with diarrhea, nausea and vomiting for 2 days. Patient has missed 2 HD sessions.  Further work-up here showed chronic gallbladder changes however she was found to have Staph aureus bacteremia with suspected site of infection being her HD dialysis catheter.  She was seen by ID and nephrology and admitted for further care.   Subjective:    Diamond [name] today has, No headache, No chest pain, No abdominal pain - No Nausea, No new weakness tingling or numbness, no SOB.   Assessment  & Plan :    Staph aureus bacteremia - MRSA in a patient with history of ESRD with right subclavian tunneled HD catheter in place.  Present on admission. She is currently stable on IV antibiotics and symptom-free, ID and nephrology are following the patient, plan is to remove the suspected infected right subclavian HD catheter and give her line holiday, she has a left arm AV fistula which likely is functional.  Continue antibiotics defer management of this issue to nephrology and ID has also requested TEE.  Known history of gallbladder mass - has unknown gallbladder mass/growth for several months.  Outpatient follow-up with her primary gastroenterologist to be arranged by PCP.  Currently symptom-free.  Hypertension  - Uncontrolled hypertension, continue blood pressure control with metoprolol.  ESRD on dialysis [facility]) - MWF, renal following.  Anemia of chronic renal disease, hgb has been stable.   Paroxysmal atrial flutter (HCC) - in RVR, placed on Cardizem drip, cardiology consulted, check TSH, Eliquis/heparin drip depending on what procedures she needs.  Case was discussed with nephrology they will clarify soon.  Acute  metabolic encephalopathy -  Patient awake and alert, non focal, encephalopathy much better likely was due to sepsis.         Condition - Extremely Guarded  Family Communication  : None present  Code Status :  Full  Consults  :  ID, Renal, Cards  PUD Prophylaxis :    Procedures  :     TTE  TEE      Disposition Plan  :    Status is: Inpatient   DVT Prophylaxis  :     Lab Results  Component Value Date   PLT 150 06/14/2022    Diet :  Diet Order             Diet Heart Room service appropriate? Yes; Fluid consistency: Thin  Diet effective now                    Inpatient Medications  Scheduled Meds:  Chlorhexidine Gluconate Cloth  6 each Topical Q0600   pantoprazole  40 mg Oral BID   sevelamer carbonate  1,600 mg Oral TID WC   vancomycin variable dose per unstable renal function (pharmacist dosing)   Does not apply See admin instructions   Continuous Infusions:  diltiazem (CARDIZEM) infusion 7.5 mg/hr (06/14/22 0208)   vancomycin     PRN Meds:.acetaminophen, hydrALAZINE, HYDROmorphone (DILAUDID) injection, oxyCODONE, prochlorperazine  Time Spent in minutes  30   Lala Lund M.D on 06/14/2022 at 11:28 AM  To page go to [email or web address]   [facility] -  Office  [phone]  See all Orders from today for further details    Objective:   Vitals:   06/14/22 0930 06/14/22 1000 06/14/22 1030 06/14/22 1100  BP: (!) 135/105 119/82 125/85 (!) 145/78  Pulse: 97 100 (!) 105 (!) 125  Resp: 19 (!) 22 (!) 25 18  Temp:      TempSrc:       SpO2: 97% 97% 100% 97%  Weight:      Height:        Wt Readings from Last 3 Encounters:  06/14/22 57.2 kg  03/15/22 62.1 kg  03/08/22 59.9 kg     Intake/Output Summary (Last 24 hours) at 06/14/2022 1128 Last data filed at 06/14/2022 0600 Gross per 24 hour  Intake 621.15 ml  Output --  Net 621.15 ml     Physical Exam  Awake Alert, No new F.N deficits, right subclavian HD catheter in place site appears clean, left arm AV fistula in place, Toco.AT,PERRAL Supple Neck, No JVD,   Symmetrical Chest wall movement, Good air movement bilaterally, CTAB iRRR,No Gallops,Rubs or new Murmurs,  +ve B.Sounds, Abd Soft, No tenderness,   No Cyanosis, Clubbing or edema     RN pressure injury documentation:     Data Review:    CBC Recent Labs  Lab 06/07/22 2212 06/12/22 1545 06/12/22 1649 06/13/22 0444 06/14/22 0113  WBC 6.1 8.6  --  7.5 11.9*  HGB 13.0 12.7 13.9  14.3 11.2* 11.6*  HCT 40.7 37.5 41.0  42.0 34.7* 33.3*  PLT 117* 157  --  136* 150  MCV 101.0* 95.9  --  98.6 94.3  MCH 32.3 32.5  --  31.8 32.9  MCHC 31.9 33.9  --  32.3 34.8  RDW 19.5* 18.5*  --  18.5* 18.3*  LYMPHSABS 1.0 0.3*  --  0.5* 0.6*  MONOABS 0.5 0.6  --  0.6 1.0  EOSABS 0.2 0.0  --  0.0 0.0  BASOSABS 0.0 0.0  --  0.0 0.0    Electrolytes Recent Labs  Lab 06/07/22 2212 06/12/22 1545 06/12/22 1640 06/12/22 1649 06/13/22 0444 06/14/22 0113  NA 140 129*  --  127*  128* 129* 135  K 4.5 5.6*  --  5.5*  5.4* 5.0 3.9  CL 105 94*  --  99 97* 92*  CO2 22 16*  --   --  14* 18*  GLUCOSE 100* 90  --  83 70 63*  BUN 50* 91*  --  80* 94* 43*  CREATININE 7.92* 11.73*  --  13.60* 12.08* 7.05*  CALCIUM 10.3 9.3  --   --  9.0 9.0  AST 24 16  --   --   --   --   ALT 14 10  --   --   --   --   ALKPHOS 59 56  --   --   --   --   BILITOT 1.1 1.1  --   --   --   --   ALBUMIN 4.2 3.0*  --   --  2.6*  --   LATICACIDVEN  --  1.0 1.5  --   --  1.0  INR  --  1.2  --   --   --   --   AMMONIA  --  40*  --   --    --   --   BNP  --  814.2*  --   --   --   --    ------------------------------------------------------------------------------------------------------------------ ID Labs Recent Labs  Lab 06/07/22 2212 06/12/22 1545 06/12/22 1640 06/12/22 1649 06/13/22 0444 06/14/22 0113  WBC 6.1 8.6  --   --  7.5 11.9*  PLT 117* 157  --   --  136* 150  LATICACIDVEN  --  1.0 1.5  --   --  1.0  CREATININE 7.92* 11.73*  --  13.60* 12.08* 7.05*   Radiology Reports CT ABDOMEN PELVIS W CONTRAST  Result Date: 06/12/2022 CLINICAL DATA:  Abdominal pain and diarrhea for 2 days EXAM: CT ABDOMEN AND PELVIS WITH CONTRAST TECHNIQUE: Multidetector CT imaging of the abdomen and pelvis was performed using the standard protocol following bolus administration of intravenous contrast. RADIATION DOSE REDUCTION: This exam was performed according to the departmental dose-optimization program which includes automated exposure control, adjustment of the mA and/or kV according to patient size and/or use of iterative reconstruction technique. CONTRAST:  160m OMNIPAQUE IOHEXOL 300 MG/ML  SOLN COMPARISON:  01/16/2022 CT, MRI from 01/17/2022 FINDINGS: Lower chest: No acute abnormality. Hepatobiliary: Liver is well visualized without focal mass lesion. Gallbladder is well distended. Known cholelithiasis is seen and stable. Wall thickening is noted similar to that seen as well. An enhancing mass is again seen in the region of the fundus measuring up to 2.8 cm again suspicious for gallbladder carcinoma. No biliary ductal dilatation is seen. Pancreas: Unremarkable. No pancreatic ductal dilatation or surrounding inflammatory changes. Spleen: Calcified granulomas are noted.  No mass lesion is seen. Adrenals/Urinary Tract: Adrenal glands are within normal limits. Kidneys demonstrate a normal enhancement pattern. Scattered cysts are again identified and stable. No follow-up is recommended. Cortical calcifications as well as nonobstructing stones  are noted on the right. No obstructive changes are noted. The bladder is decompressed with wall thickening identified Stomach/Bowel: The appendix is well visualized and within normal limits. No obstructive or inflammatory changes of the colon are seen. Small bowel shows no obstructive changes. Stomach is fluid-filled. Vascular/Lymphatic: Aortic atherosclerosis. No enlarged abdominal or pelvic lymph  nodes. Reproductive: Uterus and bilateral adnexa are unremarkable. Other: No abdominal wall hernia or abnormality. No abdominopelvic ascites. Musculoskeletal: Degenerative changes of lumbar spine are noted worst at L4-5 with endplate sclerosis and disc space widening likely related to prior discitis. The overall appearance is stable from the prior study. IMPRESSION: Cholelithiasis with wall thickening in the gallbladder as well as a gallbladder fundal mass suspicious for carcinoma. These changes are stable from prior MR and CT. Nonobstructing renal calculi on the right. Chronic changes at L4-L5 likely related to prior discitis. Electronically Signed   By: Inez Catalina M.D.   On: 06/12/2022 22:51   CT Head Wo Contrast  Result Date: 06/12/2022 CLINICAL DATA:  Altered mental status EXAM: CT HEAD WITHOUT CONTRAST TECHNIQUE: Contiguous axial images were obtained from the base of the skull through the vertex without intravenous contrast. RADIATION DOSE REDUCTION: This exam was performed according to the departmental dose-optimization program which includes automated exposure control, adjustment of the mA and/or kV according to patient size and/or use of iterative reconstruction technique. COMPARISON:  None Available. FINDINGS: Brain: No evidence of acute infarction, hemorrhage, hydrocephalus, extra-axial collection or mass lesion/mass effect. Chronic atrophic and ischemic changes are noted. Vascular: No hyperdense vessel or unexpected calcification. Skull: Normal. Negative for fracture or focal lesion. Sinuses/Orbits: No  acute finding. Other: None. IMPRESSION: Chronic atrophic and ischemic changes without acute abnormality. Electronically Signed   By: Inez Catalina M.D.   On: 06/12/2022 22:45   DG Chest 1 View  Result Date: 06/12/2022 CLINICAL DATA:  Questionable sepsis EXAM: CHEST  1 VIEW COMPARISON:  Radiograph 03/09/2022 FINDINGS: Right neck catheter tip overlies the superior cavoatrial junction. Unchanged cardiomediastinal silhouette. There is no focal airspace consolidation. There is no pleural effusion. There is no pneumothorax. There is no acute osseous abnormality. Skin fold overlies the left lower chest. IMPRESSION: No evidence of acute cardiopulmonary disease. Electronically Signed   By: Maurine Simmering M.D.   On: 06/12/2022 16:25

## 2022-06-14 NOTE — Progress Notes (Signed)
Pharmacy Antibiotic Note  Diamond [name] is a 71 y.o. female admitted on 06/12/2022 with MRSA bacteremia.  Pharmacy has been consulted for vancomycin dosing. Patient got loading dose of vancomycin on 7/9 and has not received dialysis since the loading dose. Patient scheduled for hemodialysis on 7/10.  Plan: Vancomycin 500 mg after HD. Follow-up tomorrow if patient gets a line holiday.   Height: '5\' 7"'$  (170.2 cm) Weight: 59 kg (130 lb 1.1 oz) IBW/kg (Calculated) : 61.6  Temp (24hrs), Avg:99.2 F (37.3 C), Min:98.5 F (36.9 C), Max:100.3 F (37.9 C)  Recent Labs  Lab 06/07/22 2212 06/12/22 1545 06/12/22 1640 06/12/22 1649 06/13/22 0444 06/14/22 0113  WBC 6.1 8.6  --   --  7.5 11.9*  CREATININE 7.92* 11.73*  --  13.60* 12.08* 7.05*  LATICACIDVEN  --  1.0 1.5  --   --  1.0    Estimated Creatinine Clearance: 6.7 mL/min (A) (by C-G formula based on SCr of 7.05 mg/dL (H)).    Allergies  Allergen Reactions   Shellfish Allergy     Unknown reaction     Antimicrobials this admission: Vancomycin 7/8 >>   Microbiology results: 7/8 Bcx 2/2: MRSA  Thank you for allowing pharmacy to be a part of this patient's care.  Jeneen Rinks, Pharm.D PGY1 Pharmacy Resident 06/14/2022 8:19 AM

## 2022-06-14 NOTE — Progress Notes (Signed)
[facility] Progress Note  Subjective: seen in HD unit.  Not answering questions very well, but is awake and responsive and not in distress.   Vitals:   06/14/22 1000 06/14/22 1030 06/14/22 1100 06/14/22 1130  BP: 119/82 125/85 (!) 145/78 (!) 121/93  Pulse: 100 (!) 105 (!) 125 (!) 115  Resp: (!) 22 (!) 25 18 (!) 30  Temp:      TempSrc:      SpO2: 97% 100% 97% 99%  Weight:      Height:        Exam: General: Appears comfortable, in no distress  Head: NCAT sclera not icteric MMM Neck: Supple. No JVD appreciated  Lungs: Clear bilaterally without wheezes, rales, or rhonchi. Normal WOB  Heart: RRR, no murmur, rub, or gallop  Abdomen: soft non-tender, bowel sounds normal, no masses  Lower extremities:without edema or ischemic changes, no open wounds  Neuro: A & O X 2 Follows commands  Psych: alert, nonfocal, sluggish Dialysis Access: R IJ TDC in place; LUE AVF +bruit     OP HD: [place] TTS 4h  60.3kg  400/600  2/2 bath  AVF (2nd stage BVT 03/08/22) / RIJ TDC  Hep none - mircera 150 q2, last 6/24 - hectorol 4 ug iv tts   Assessment/ Plan: MRSA Bacteremia. H/o MRSA bacteremia/endocarditis. IV Vancomycin per primary. For TEE. Needs dialysis cathter removed. AVF working well today on HD. Will consult [facility] for TDC removal, hopefully will not need replacement if AVF continues to work well.  AMS - No acute findings on head CT. Possibly infection related Abdominal pain/Gallbladder mass -  W/u per primary team  ESRD -  HD TTS. Last dialysis on 7/1. Missed dialysis last week d/t #3. Plan for dialysis today, then resume regular schedule.  Using fistula.  Hypertension/volume  - BP is good, no sig vol overload. Under dry wt. Small UFG Anemia  - Hgb 11.2. No ESA needs.  Metabolic bone disease -  Phos above goal. Continue Renvela binders.  Hx atrial flutter     Rob Armany Mano 06/14/2022, 11:54 AM   Recent Labs  Lab 06/12/22 1545 06/12/22 1649 06/13/22 0444 06/14/22 0113   HGB 12.7   < > 11.2* 11.6*  ALBUMIN 3.0*  --  2.6*  --   CALCIUM 9.3  --  9.0 9.0  PHOS  --   --  8.9*  --   CREATININE 11.73*   < > 12.08* 7.05*  K 5.6*   < > 5.0 3.9   < > = values in this interval not displayed.   No results for input(s): "IRON", "TIBC", "FERRITIN" in the last 168 hours. Inpatient medications:  Chlorhexidine Gluconate Cloth  6 each Topical Q0600   pantoprazole  40 mg Oral BID   sevelamer carbonate  1,600 mg Oral TID WC   vancomycin variable dose per unstable renal function (pharmacist dosing)   Does not apply See admin instructions    diltiazem (CARDIZEM) infusion 7.5 mg/hr (06/14/22 0208)   vancomycin     acetaminophen, hydrALAZINE, HYDROmorphone (DILAUDID) injection, oxyCODONE, prochlorperazine

## 2022-06-14 NOTE — Progress Notes (Addendum)
[facility] for Infectious Disease  Date of Admission:  06/12/2022           Reason for visit: Follow up on MRSA bacteremia  Current antibiotics: Vancomycin 7/9 -- present  ASSESSMENT:    71 y.o. female admitted with:  MRSA Bacteremia: Patient admitted with positive blood cultures 06/12/2022.  Repeat cultures obtained today.  Patient also has a history of MRSA bacteremia complicated by Q0-G8 vertebral infection and mitral valve thickening/fibrinous density (TTE) with possible infected right atrial thrombus/vegetation (TEE) in January 2023.  Status post 8 weeks of vancomycin at that time.  She maintains a right IJ dialysis catheter which is suspected to be the source of her recurrent bacteremia. End-stage renal disease: On hemodialysis.   RECOMMENDATIONS:    Continue vancomycin Discussed with RN.  She is currently dialyzing through her left upper extremity AV fistula with plans to remove her dialysis catheter today Repeat blood cultures are pending We will need to repeat another set of blood cultures following dialysis catheter removal Recommend TEE in the setting of her recurrent bacteremia and prior endocarditis Will follow   Principal Problem:   Gallbladder mass Active Problems:   Hypertension   MRSA bacteremia   ESRD on dialysis (HCC)   Paroxysmal atrial flutter (HCC)   Acute metabolic encephalopathy    MEDICATIONS:    Scheduled Meds:  Chlorhexidine Gluconate Cloth  6 each Topical Q0600   pantoprazole  40 mg Oral BID   sevelamer carbonate  1,600 mg Oral TID WC   vancomycin variable dose per unstable renal function (pharmacist dosing)   Does not apply See admin instructions   Continuous Infusions:  diltiazem (CARDIZEM) infusion 7.5 mg/hr (06/14/22 0208)   vancomycin     PRN Meds:.acetaminophen, hydrALAZINE, HYDROmorphone (DILAUDID) injection, oxyCODONE, prochlorperazine  SUBJECTIVE:   24 hour events:  No acute events noted overnight  Patient seen on  the dialysis unit.  She is eating breakfast.  She reports of right-sided pain.  But otherwise is tolerating using her AV fistula at this time.  Discussed with RN and the plan is to have her dialysis catheter removed.  Review of Systems  All other systems reviewed and are negative.     OBJECTIVE:   Blood pressure 125/85, pulse (!) 105, temperature (!) 97.2 F (36.2 C), temperature source Oral, resp. rate (!) 25, height '5\' 7"'$  (1.702 m), weight 57.2 kg, SpO2 100 %. Body mass index is 19.75 kg/m.  Physical Exam Constitutional:      General: She is not in acute distress.    Appearance: Normal appearance.  HENT:     Head: Normocephalic and atraumatic.  Eyes:     Extraocular Movements: Extraocular movements intact.     Conjunctiva/sclera: Conjunctivae normal.  Cardiovascular:     Comments: Right IJ dialysis line in place.  No warmth, tenderness, erythema surrounding the site. Pulmonary:     Effort: Pulmonary effort is normal. No respiratory distress.  Abdominal:     General: There is no distension.     Palpations: Abdomen is soft.  Musculoskeletal:     Comments: Left upper extremity AV fistula currently being accessed.  Skin:    General: Skin is warm and dry.  Neurological:     General: No focal deficit present.     Mental Status: She is alert and oriented to person, place, and time.  Psychiatric:        Mood and Affect: Mood normal.        Behavior:  Behavior normal.      Lab Results: Lab Results  Component Value Date   WBC 11.9 (H) 06/14/2022   HGB 11.6 (L) 06/14/2022   HCT 33.3 (L) 06/14/2022   MCV 94.3 06/14/2022   PLT 150 06/14/2022    Lab Results  Component Value Date   NA 135 06/14/2022   K 3.9 06/14/2022   CO2 18 (L) 06/14/2022   GLUCOSE 63 (L) 06/14/2022   BUN 43 (H) 06/14/2022   CREATININE 7.05 (H) 06/14/2022   CALCIUM 9.0 06/14/2022   GFRNONAA 6 (L) 06/14/2022   GFRAA 14 (L) 11/12/2019    Lab Results  Component Value Date   ALT 10 06/12/2022    AST 16 06/12/2022   ALKPHOS 56 06/12/2022   BILITOT 1.1 06/12/2022       Component Value Date/Time   CRP 6.2 (H) 01/21/2022 0228       Component Value Date/Time   ESRSEDRATE 64 (H) 12/17/2021 0630     I have reviewed the micro and lab results in Epic.  Imaging: CT ABDOMEN PELVIS W CONTRAST  Result Date: 06/12/2022 CLINICAL DATA:  Abdominal pain and diarrhea for 2 days EXAM: CT ABDOMEN AND PELVIS WITH CONTRAST TECHNIQUE: Multidetector CT imaging of the abdomen and pelvis was performed using the standard protocol following bolus administration of intravenous contrast. RADIATION DOSE REDUCTION: This exam was performed according to the departmental dose-optimization program which includes automated exposure control, adjustment of the mA and/or kV according to patient size and/or use of iterative reconstruction technique. CONTRAST:  113m OMNIPAQUE IOHEXOL 300 MG/ML  SOLN COMPARISON:  01/16/2022 CT, MRI from 01/17/2022 FINDINGS: Lower chest: No acute abnormality. Hepatobiliary: Liver is well visualized without focal mass lesion. Gallbladder is well distended. Known cholelithiasis is seen and stable. Wall thickening is noted similar to that seen as well. An enhancing mass is again seen in the region of the fundus measuring up to 2.8 cm again suspicious for gallbladder carcinoma. No biliary ductal dilatation is seen. Pancreas: Unremarkable. No pancreatic ductal dilatation or surrounding inflammatory changes. Spleen: Calcified granulomas are noted.  No mass lesion is seen. Adrenals/Urinary Tract: Adrenal glands are within normal limits. Kidneys demonstrate a normal enhancement pattern. Scattered cysts are again identified and stable. No follow-up is recommended. Cortical calcifications as well as nonobstructing stones are noted on the right. No obstructive changes are noted. The bladder is decompressed with wall thickening identified Stomach/Bowel: The appendix is well visualized and within normal limits.  No obstructive or inflammatory changes of the colon are seen. Small bowel shows no obstructive changes. Stomach is fluid-filled. Vascular/Lymphatic: Aortic atherosclerosis. No enlarged abdominal or pelvic lymph nodes. Reproductive: Uterus and bilateral adnexa are unremarkable. Other: No abdominal wall hernia or abnormality. No abdominopelvic ascites. Musculoskeletal: Degenerative changes of lumbar spine are noted worst at L4-5 with endplate sclerosis and disc space widening likely related to prior discitis. The overall appearance is stable from the prior study. IMPRESSION: Cholelithiasis with wall thickening in the gallbladder as well as a gallbladder fundal mass suspicious for carcinoma. These changes are stable from prior MR and CT. Nonobstructing renal calculi on the right. Chronic changes at L4-L5 likely related to prior discitis. Electronically Signed   By: MInez CatalinaM.D.   On: 06/12/2022 22:51   CT Head Wo Contrast  Result Date: 06/12/2022 CLINICAL DATA:  Altered mental status EXAM: CT HEAD WITHOUT CONTRAST TECHNIQUE: Contiguous axial images were obtained from the base of the skull through the vertex without intravenous contrast. RADIATION DOSE REDUCTION: This  exam was performed according to the departmental dose-optimization program which includes automated exposure control, adjustment of the mA and/or kV according to patient size and/or use of iterative reconstruction technique. COMPARISON:  None Available. FINDINGS: Brain: No evidence of acute infarction, hemorrhage, hydrocephalus, extra-axial collection or mass lesion/mass effect. Chronic atrophic and ischemic changes are noted. Vascular: No hyperdense vessel or unexpected calcification. Skull: Normal. Negative for fracture or focal lesion. Sinuses/Orbits: No acute finding. Other: None. IMPRESSION: Chronic atrophic and ischemic changes without acute abnormality. Electronically Signed   By: Inez Catalina M.D.   On: 06/12/2022 22:45   DG Chest 1  View  Result Date: 06/12/2022 CLINICAL DATA:  Questionable sepsis EXAM: CHEST  1 VIEW COMPARISON:  Radiograph 03/09/2022 FINDINGS: Right neck catheter tip overlies the superior cavoatrial junction. Unchanged cardiomediastinal silhouette. There is no focal airspace consolidation. There is no pleural effusion. There is no pneumothorax. There is no acute osseous abnormality. Skin fold overlies the left lower chest. IMPRESSION: No evidence of acute cardiopulmonary disease. Electronically Signed   By: Maurine Simmering M.D.   On: 06/12/2022 16:25     Imaging independently reviewed in Epic.    [facility] [phone] pager 06/14/2022, 10:51 AM  I have personally spent 50 minutes involved in face-to-face and non-face-to-face activities for this patient on the day of the visit. Professional time spent includes the following activities: Preparing to see the patient (review of tests), Obtaining and/or reviewing separately obtained history (admission/discharge record), Performing a medically appropriate examination and/or evaluation , Ordering medications/tests/procedures, referring and communicating with other health care professionals, Documenting clinical information in the EMR, Independently interpreting results (not separately reported), Communicating results to the patient/family/caregiver, Counseling and educating the patient/family/caregiver and Care coordination (not separately reported).

## 2022-06-14 NOTE — Progress Notes (Signed)
Overnight progress note  Patient with history of ESRD on HD, anemia, hypertension, history of MRSA L5-S1 discitis and endocarditis, paroxysmal A-fib/flutter currently admitted for gallbladder mass and MRSA bacteremia.  Tonight after she returned from dialysis she was noted to be in A-fib with RVR with rate in the 180s.  Patient asymptomatic.  She was given IV Lopressor 5 mg x 2 and a 250 cc fluid bolus due to soft blood pressure.  Rate continues to be in the 140s.  Most recent blood pressure 118/80.  Discussed with on-call cardiologist Dr. Marcelle Smiling and patient started on Cardizem drip.  Will continue to monitor closely.  She is on vancomycin for MRSA bacteremia and still having low-grade fevers, ID following.  Repeat labs ordered including CBC, lactate, and blood cultures.  She is supposed to have her dialysis catheter removed, please coordinate with nephrology in the morning.

## 2022-06-14 NOTE — Progress Notes (Signed)
  Catheter Removal Procedure Note    Diagnosis: ESRD  Plan:  Remove right diatek catheter  Consent signed:  Yes.   Time out completed:  Yes.   Coumadin:  No. Eliquis 2.'5mg'$  > 24hr ago. PT/INR (if applicable):   Other labs:  Procedure: 1.  Sterile prepping and draping over catheter area  (Chlora-prep used due to shellfish allergy) 2. 8 ml 1% lidocaine plain instilled at removal site. 3.  right catheter removed in its entirety with cuff in tact. 4.  Complications:  no 5. Tip of catheter sent for culture:  Yes.     Patient tolerated procedure well:  Yes.   Pressure held, no bleeding noted, dressing applied Instructions given to the RN regarding on how to hold pressure if bleeding occurs  Please hold heparin until tomorrow 04/15/2022 at 0600 given high risk of bleeding.  Spoke with pharmacy and order to be changed for the above.       Leontine Locket, PA-C 06/14/2022 1:30 PM [phone]

## 2022-06-14 NOTE — Progress Notes (Signed)
Discussed with [facility]/Endoscopy Currently TEE cannot be done until Thursday Have written orders   Jenkins Rouge MD [facility]

## 2022-06-14 NOTE — Progress Notes (Signed)
   06/13/22 2356  Assess: MEWS Score  Temp 99.1 F (37.3 C)  BP 108/70  MAP (mmHg) 81  Pulse Rate (!) 117  Resp 18  Level of Consciousness Alert  SpO2 99 %  O2 Device Room Air  Assess: MEWS Score  MEWS Temp 0  MEWS Systolic 0  MEWS Pulse 2  MEWS RR 0  MEWS LOC 0  MEWS Score 2  MEWS Score Color Yellow  Assess: if the MEWS score is Yellow or Red  Were vital signs taken at a resting state? Yes  Focused Assessment No change from prior assessment  Does the patient meet 2 or more of the SIRS criteria? No  MEWS guidelines implemented *See Row Information* No, previously yellow, continue vital signs every 4 hours  Treat  Pain Scale 0-10  Pain Score 0  Notify: Rapid Response  Name of Rapid Response RN Notified Mindy RN  Date Rapid Response Notified 06/14/22  Time Rapid Response Notified 0001  Document  Patient Outcome Stabilized after interventions  Progress note created (see row info) Yes  Assess: SIRS CRITERIA  SIRS Temperature  0  SIRS Pulse 1  SIRS Respirations  0  SIRS WBC 0  SIRS Score Sum  1

## 2022-06-14 NOTE — Progress Notes (Addendum)
H&P    CC:  Catheter removal   HPI:  This is a 71 y.o. female in need of tunneled dialysis catheter removal.  She has had a 2nd stage left BVT on 03/08/2022 by Dr. Stanford Breed.  Her TDC was placed 12/23/2021 also by Dr. Stanford Breed.  She has a MRSA bacteremia and vascular surgery has been asked to remove her TDC.  She has had Eliquis 2.'5mg'$  yesterday at 0930.    Past Medical History:  Diagnosis Date   Anemia    Asthma    Atrial flutter with rapid ventricular response ([place]) 12/25/2021   Chronic kidney disease    dialysis Tues Thurs Sat   Dysrhythmia    PAF 10/2019 in setting of COVID-19 PNA   Gout    Hypertension     FH:  Non-Contributory  Social History   Socioeconomic History   Marital status: Widowed    Spouse name: Not on file   Number of children: Not on file   Years of education: Not on file   Highest education level: Not on file  Occupational History   Not on file  Tobacco Use   Smoking status: Never    Passive exposure: Never   Smokeless tobacco: Never  Vaping Use   Vaping Use: Never used  Substance and Sexual Activity   Alcohol use: No   Drug use: No   Sexual activity: Not Currently    Birth control/protection: Post-menopausal  Other Topics Concern   Not on file  Social History Narrative   Not on file   Social Determinants of Health   Financial Resource Strain: Not on file  Food Insecurity: Not on file  Transportation Needs: Not on file  Physical Activity: Not on file  Stress: Not on file  Social Connections: Not on file  Intimate Partner Violence: Not on file    Allergies  Allergen Reactions   Shellfish Allergy     Unknown reaction     Current Facility-Administered Medications  Medication Dose Route Frequency Provider Last Rate Last Admin   acetaminophen (TYLENOL) suppository 650 mg  650 mg Rectal Q6H PRN Shela Leff, MD   650 mg at 06/13/22 2151   Chlorhexidine Gluconate Cloth 2 % PADS 6 each  6 each Topical Q0600 Lynnda Child, PA-C    6 each at 06/14/22 0500   [START ON 06/15/2022] Chlorhexidine Gluconate Cloth 2 % PADS 6 each  6 each Topical Q0600 Roney Jaffe, MD       diltiazem (CARDIZEM) 125 mg in dextrose 5% 125 mL (1 mg/mL) infusion  5-15 mg/hr Intravenous Titrated Shela Leff, MD 7.5 mL/hr at 06/14/22 0208 7.5 mg/hr at 06/14/22 0208   hydrALAZINE (APRESOLINE) injection 10 mg  10 mg Intravenous Q6H PRN Arrien, Jimmy Picket, MD   10 mg at 06/13/22 1400   HYDROmorphone (DILAUDID) injection 0.5 mg  0.5 mg Intravenous Q4H PRN Irene Pap N, DO       lidocaine HCl (PF) (XYLOCAINE) 2 % injection 0-20 mL  0-20 mL Intradermal Once PRN Rhyne, Samantha J, PA-C       oxyCODONE (Oxy IR/ROXICODONE) immediate release tablet 5 mg  5 mg Oral Q6H PRN Irene Pap N, DO   5 mg at 06/13/22 0232   pantoprazole (PROTONIX) EC tablet 40 mg  40 mg Oral BID Irene Pap N, DO   40 mg at 06/14/22 0801   prochlorperazine (COMPAZINE) injection 10 mg  10 mg Intravenous Q6H PRN Kayleen Memos, DO  sevelamer carbonate (RENVELA) tablet 1,600 mg  1,600 mg Oral TID WC Hall, Carole N, DO   1,600 mg at 06/14/22 0801   vancomycin (VANCOREADY) IVPB 500 mg/100 mL  500 mg Intravenous Q M,W,F-HD Thurnell Lose, MD       vancomycin variable dose per unstable renal function (pharmacist dosing)   Does not apply See admin instructions Arrien, Jimmy Picket, MD        ROS:  See HPI  PHYSICAL EXAM  Vitals:   06/14/22 1200 06/14/22 1300  BP:    Pulse: 96 (!) 119  Resp:    Temp:    SpO2:      Gen:  Well developed well nourished HEENT:  normocephalic Neck:  right TDC catheter in place Heart:  irregular/tachy Lungs:  Non-labored Extremities:  +thrill left arm fistula Skin:  No obvious rashes   Lab:  platelets today 150k   Impression: This is a 71 y.o. female here for diatek catheter removal   Plan:  Removal of right diatek catheter -Eliquis was discontinued after yesterday's dose. -order for consent has been  placed   Leontine Locket, PA-C [facility] [phone] 06/14/2022 1:24 PM  I have seen and evaluated the patient. I agree with the PA note as documented above. TDC removed at bedside.  Hopefully can use left arm basilic vein fistula that has been transposed with excellent thrill.  Marty Heck, MD [facility] [place] Office: [phone]

## 2022-06-14 NOTE — Progress Notes (Signed)
  Transition of Care [facility]) Screening Note   Patient Details  Name: Diamond [name] Date of Birth: 06/16/1951   Transition of Care [facility]) CM/SW Contact:    Cyndi Bender, RN Phone Number: 06/14/2022, 9:24 AM    Transition of Care Department [facility]) has reviewed patient.Patient has had 10 ED visits and 2 hospital admits in the last 6 months. Patient admitted for abdominal pain and Nausea and vomiting. Missed dialysis (ESRD on HD TTS ).Patient went home with [facility] home health in 01/2022. Patient has hospital bed, wheelchair and 3&1.  We will continue to monitor patient advancement through interdisciplinary progression rounds. If new patient transition needs arise, please place a TOC consult.

## 2022-06-14 NOTE — Progress Notes (Addendum)
ANTICOAGULATION CONSULT NOTE - Initial Consult  Pharmacy Consult for Heparin  Indication:  history of Atrial fibrillation   Allergies  Allergen Reactions   Shellfish Allergy     Unknown reaction     Patient Measurements: Height: '5\' 7"'$  (170.2 cm) Weight: 57.3 kg (126 lb 5.2 oz) IBW/kg (Calculated) : 61.6 Heparin Dosing Weight: 57.3 kg  Vital Signs: Temp: 97.4 F (36.3 C) (07/10 1159) Temp Source: Oral (07/10 1159) BP: 128/84 (07/10 1159) Pulse Rate: 96 (07/10 1200)  Labs: Recent Labs    06/12/22 1545 06/12/22 1649 06/12/22 1706 06/13/22 0444 06/14/22 0113  HGB 12.7 13.9  14.3  --  11.2* 11.6*  HCT 37.5 41.0  42.0  --  34.7* 33.3*  PLT 157  --   --  136* 150  APTT 37*  --   --   --   --   LABPROT 15.2  --   --   --   --   INR 1.2  --   --   --   --   CREATININE 11.73* 13.60*  --  12.08* 7.05*  TROPONINIHS 19*  --  22*  --   --     Estimated Creatinine Clearance: 6.5 mL/min (A) (by C-G formula based on SCr of 7.05 mg/dL (H)).   Medical History: Past Medical History:  Diagnosis Date   Anemia    Asthma    Atrial flutter with rapid ventricular response ([place]) 12/25/2021   Chronic kidney disease    dialysis Tues Thurs Sat   Dysrhythmia    PAF 10/2019 in setting of COVID-19 PNA   Gout    Hypertension     Medications:  Medications Prior to Admission  Medication Sig Dispense Refill Last Dose   acetaminophen (TYLENOL) 500 MG tablet Take 1,000 mg by mouth every 6 (six) hours as needed for moderate pain.   Past Week   albuterol (VENTOLIN HFA) 108 (90 Base) MCG/ACT inhaler Inhale 2 puffs into the lungs every 4 (four) hours as needed for wheezing or shortness of breath.   unknown   allopurinol (ZYLOPRIM) 100 MG tablet Take 100 mg by mouth 2 (two) times daily.   Past Week   apixaban (ELIQUIS) 2.5 MG TABS tablet Take 1 tablet (2.5 mg total) by mouth 2 (two) times daily. 60 tablet 11 06/11/2022 at 7:00pm   cyclobenzaprine (FLEXERIL) 5 MG tablet Take 2.5 mg by mouth  daily as needed for muscle spasms.   unknown   fluticasone-salmeterol (ADVAIR) 250-50 MCG/ACT AEPB Inhale 1 puff into the lungs 2 (two) times daily.   Past Week   hydrALAZINE (APRESOLINE) 50 MG tablet Take 1 tablet (50 mg total) by mouth 3 (three) times daily. 270 tablet 3 Past Week   Lidocaine 4 % PTCH Apply 1 patch topically daily as needed (pain).   unknown   metoprolol tartrate (LOPRESSOR) 50 MG tablet Take 1 tablet (50 mg total) by mouth 2 (two) times daily.   06/11/2022 at 7:00pm   ondansetron (ZOFRAN-ODT) 4 MG disintegrating tablet Take 1 tablet (4 mg total) by mouth every 8 (eight) hours as needed for nausea or vomiting. 20 tablet 0 unknown   oxyCODONE (ROXICODONE) 5 MG immediate release tablet Take 1 tablet (5 mg total) by mouth every 6 (six) hours as needed for severe pain. 20 tablet 0 unknown   pantoprazole (PROTONIX) 40 MG tablet Take 1 tablet (40 mg total) by mouth 2 (two) times daily. 60 tablet 0 Past Week   verapamil (CALAN) 120 MG tablet  Take 120 mg by mouth 3 (three) times daily. Take one tablet three times a day for 90 days.   Past Week   sevelamer carbonate (RENVELA) 800 MG tablet Take 2 tablets (1,600 mg total) by mouth 3 (three) times daily with meals. (Patient not taking: Reported on 06/13/2022)   Not Taking    [name] with history of atrial fibrillation on Eliquis prior to admission. Eliquis held for proceure. Plan for  HD catheter removal per VVS. Last eliquis given on 7/9 0930AM.  Will monitor heparin using aPTTs due to apixaban's effect on elevated heparin levels,  until heparin level and aPTT correlate.   Hgb stable at 11.6, pltc 150, up from 136k.  No bleeding noted.   V[facility] plans for [facility] removal this afternoon.  Per. Candiss Norse okay to start IV heparin after 1800  today, after TDC removed.    Goal of Therapy:  Heparin level 0.3-0.7 units/ml aPTT 66-102 seconds Monitor platelets by anticoagulation protocol: Yes   Plan:  Obtain  baseline aPTT, heparin level.    After 1800 today/ after TDC removed,  start IV Heparin infusion 800 units/hr (no bolus) F/u 8 hr aPTT (secondary to PTA apix) Daily aPTT, HL , CBC F/u eliquis for restart.    Thank you for allowing pharmacy to be part of this patients care team. Nicole Cella, [place] Pharmacist [phone] 06/14/2022,1:04 PM   Please check AMION for all [place] phone numbers After 10:00 PM, call [place]

## 2022-06-15 ENCOUNTER — Encounter (HOSPITAL_COMMUNITY): Payer: Self-pay | Admitting: Internal Medicine

## 2022-06-15 ENCOUNTER — Inpatient Hospital Stay (HOSPITAL_COMMUNITY): Payer: Medicare HMO

## 2022-06-15 DIAGNOSIS — R7881 Bacteremia: Secondary | ICD-10-CM | POA: Diagnosis not present

## 2022-06-15 DIAGNOSIS — Z992 Dependence on renal dialysis: Secondary | ICD-10-CM | POA: Diagnosis not present

## 2022-06-15 DIAGNOSIS — N186 End stage renal disease: Secondary | ICD-10-CM | POA: Diagnosis not present

## 2022-06-15 DIAGNOSIS — B9562 Methicillin resistant Staphylococcus aureus infection as the cause of diseases classified elsewhere: Secondary | ICD-10-CM | POA: Diagnosis not present

## 2022-06-15 DIAGNOSIS — I4892 Unspecified atrial flutter: Secondary | ICD-10-CM | POA: Diagnosis not present

## 2022-06-15 LAB — CULTURE, BLOOD (ROUTINE X 2)

## 2022-06-15 LAB — CBC WITH DIFFERENTIAL/PLATELET
Abs Immature Granulocytes: 0.06 10*3/uL (ref 0.00–0.07)
Basophils Absolute: 0 10*3/uL (ref 0.0–0.1)
Basophils Relative: 0 %
Eosinophils Absolute: 0 10*3/uL (ref 0.0–0.5)
Eosinophils Relative: 0 %
HCT: 32.2 % — ABNORMAL LOW (ref 36.0–46.0)
Hemoglobin: 10.9 g/dL — ABNORMAL LOW (ref 12.0–15.0)
Immature Granulocytes: 1 %
Lymphocytes Relative: 9 %
Lymphs Abs: 0.6 10*3/uL — ABNORMAL LOW (ref 0.7–4.0)
MCH: 32.2 pg (ref 26.0–34.0)
MCHC: 33.9 g/dL (ref 30.0–36.0)
MCV: 95.3 fL (ref 80.0–100.0)
Monocytes Absolute: 0.9 10*3/uL (ref 0.1–1.0)
Monocytes Relative: 12 %
Neutro Abs: 5.9 10*3/uL (ref 1.7–7.7)
Neutrophils Relative %: 78 %
Platelets: 155 10*3/uL (ref 150–400)
RBC: 3.38 MIL/uL — ABNORMAL LOW (ref 3.87–5.11)
RDW: 18.4 % — ABNORMAL HIGH (ref 11.5–15.5)
WBC: 7.4 10*3/uL (ref 4.0–10.5)
nRBC: 0 % (ref 0.0–0.2)

## 2022-06-15 LAB — APTT
aPTT: 39 seconds — ABNORMAL HIGH (ref 24–36)
aPTT: 38 seconds — ABNORMAL HIGH (ref 24–36)

## 2022-06-15 LAB — MRSA NEXT GEN BY PCR, NASAL: MRSA by PCR Next Gen: DETECTED — AB

## 2022-06-15 LAB — GLUCOSE, CAPILLARY: Glucose-Capillary: 150 mg/dL — ABNORMAL HIGH (ref 70–99)

## 2022-06-15 LAB — HEPATITIS B SURFACE ANTIBODY, QUANTITATIVE: Hep B S AB Quant (Post): 5 m[IU]/mL — ABNORMAL LOW (ref >9.9)

## 2022-06-15 MED ORDER — LIDOCAINE-PRILOCAINE 2.5-2.5 % EX CREA: 1.0000 | TOPICAL_CREAM | CUTANEOUS | Status: DC | PRN

## 2022-06-15 MED ORDER — LIDOCAINE HCL (PF) 1 % IJ SOLN: 5.0000 mL | INTRAMUSCULAR | Status: DC | PRN

## 2022-06-15 MED ORDER — APIXABAN 2.5 MG PO TABS: 2.5000 mg | ORAL_TABLET | Freq: Two times a day (BID) | ORAL | Status: DC

## 2022-06-15 MED ORDER — DILTIAZEM HCL 60 MG PO TABS: 90.0000 mg | ORAL_TABLET | Freq: Three times a day (TID) | ORAL | Status: DC

## 2022-06-15 MED ORDER — PENTAFLUOROPROP-TETRAFLUOROETH EX AERO
1.0000 | INHALATION_SPRAY | CUTANEOUS | Status: DC | PRN
Start: 2022-06-15 — End: 2022-06-15

## 2022-06-15 MED ORDER — VANCOMYCIN HCL 500 MG/100ML IV SOLN
500.0000 mg | INTRAVENOUS | Status: DC
Start: 2022-06-15 — End: 2022-06-30
  Administered 2022-06-15 – 2022-06-29 (×7): 500 mg via INTRAVENOUS
  Filled 2022-06-15 (×9): qty 100

## 2022-06-15 MED ORDER — DILTIAZEM HCL 60 MG PO TABS
60.0000 mg | ORAL_TABLET | Freq: Three times a day (TID) | ORAL | Status: DC
  Administered 2022-06-15 – 2022-06-19 (×13): 60 mg via ORAL
  Filled 2022-06-15 (×14): qty 1

## 2022-06-15 MED ORDER — PENTAFLUOROPROP-TETRAFLUOROETH EX AERO: 1.0000 | INHALATION_SPRAY | CUTANEOUS | Status: DC | PRN

## 2022-06-15 MED ORDER — HEPARIN (PORCINE) 25000 UT/250ML-% IV SOLN
1150.0000 [IU]/h | INTRAVENOUS | Status: DC
  Administered 2022-06-15: 800 [IU]/h via INTRAVENOUS
  Administered 2022-06-16: 1150 [IU]/h via INTRAVENOUS
  Filled 2022-06-15: qty 250

## 2022-06-15 NOTE — Progress Notes (Signed)
[facility] Progress Note  Subjective: seen in room, seems more alert today. TDC pulled last night.   Vitals:   06/15/22 0850 06/15/22 1203 06/15/22 1500 06/15/22 1528  BP: 125/80 98/68 123/78 127/75  Pulse: 99 81 86 86  Resp: 17 18 (!) 22 13  Temp: 98.3 F (36.8 C) 97.9 F (36.6 C) 98.3 F (36.8 C)   TempSrc: Axillary Oral Oral   SpO2: 97% 96% 92%   Weight:   56.8 kg   Height:        Exam: General: Appears comfortable, in no distress  Head: NCAT sclera not icteric MMM Neck: Supple. No JVD appreciated  Lungs: Clear bilaterally without wheezes, rales, or rhonchi. Normal WOB  Heart: RRR, no murmur, rub, or gallop  Abdomen: soft non-tender, no mass or s Lower extremities: w/o edema Neuro: A & O X 2 Follows commands  Psych: alert, nonfocal, sluggish Dialysis Access: R IJ TDC in place; LUE AVF +bruit     OP HD: [place] TTS 4h  60.3kg  400/600  2/2 bath  AVF (2nd stage BVT 03/08/22) / RIJ TDC  Hep none - mircera 150 q2, last 6/24 - hectorol 4 ug iv tts   Assessment/ Plan: MRSA Bacteremia - H/o MRSA bacteremia/endocarditis. IV Vancomycin per primary. For TEE. TDC removed on 7/10 by VVS. Hopefully AVF will prove to be viable and will not need another catheter.  AMS - No acute findings on head CT. Possibly infection related. Seems better.  Abdominal pain/Gallbladder mass -  W/u per primary team  ESRD -  HD TTS. Last dialysis on 7/1. Missed dialysis last week d/t #3. Plan for dialysis today to get back on schedule. Using AVF as we did yesterday.   Hypertension/volume  - BP is good, no sig vol overload. Under dry wt. Small UFG Anemia  - Hgb 11.2. No ESA needs.  Metabolic bone disease -  Phos above goal. Continue Renvela binders.  Hx atrial flutter     Rob Avry [place] 06/15/2022, 3:57 PM   Recent Labs  Lab 06/12/22 1545 06/12/22 1649 06/13/22 0444 06/14/22 0113 06/15/22 0243  HGB 12.7   < > 11.2* 11.6* 10.9*  ALBUMIN 3.0*  --  2.6*  --   --   CALCIUM 9.3  --   9.0 9.0  --   PHOS  --   --  8.9*  --   --   CREATININE 11.73*   < > 12.08* 7.05*  --   K 5.6*   < > 5.0 3.9  --    < > = values in this interval not displayed.    No results for input(s): "IRON", "TIBC", "FERRITIN" in the last 168 hours. Inpatient medications:  Chlorhexidine Gluconate Cloth  6 each Topical Q0600   Chlorhexidine Gluconate Cloth  6 each Topical Q0600   diltiazem  60 mg Oral Q8H   pantoprazole  40 mg Oral BID   sevelamer carbonate  1,600 mg Oral TID WC    diltiazem (CARDIZEM) infusion 15 mg/hr (06/15/22 0159)   heparin 800 Units/hr (06/15/22 1118)   vancomycin     acetaminophen, hydrALAZINE, HYDROmorphone (DILAUDID) injection, lidocaine (PF), lidocaine (PF), lidocaine HCl (PF), lidocaine-prilocaine, lidocaine-prilocaine, oxyCODONE, pentafluoroprop-tetrafluoroeth, pentafluoroprop-tetrafluoroeth, prochlorperazine

## 2022-06-15 NOTE — Progress Notes (Signed)
ANTICOAGULATION CONSULT NOTE Pharmacy Consult for Heparin  Indication:  history of Atrial fibrillation   Allergies  Allergen Reactions   Shellfish Allergy     Unknown reaction      Labs: Recent Labs    06/13/22 0444 06/14/22 0113 06/14/22 1304 06/15/22 0243 06/15/22 0857 06/15/22 2056  HGB 11.2* 11.6*  --  10.9*  --   --   HCT 34.7* 33.3*  --  32.2*  --   --   PLT 136* 150  --  155  --   --   APTT  --   --  35  --  39* 38*  HEPARINUNFRC  --   --  >1.10*  --   --   --   CREATININE 12.08* 7.05*  --   --   --   --     [name] with history of atrial fibrillation on Eliquis prior to admission. Eliquis held for proceure. HD catheter removal 7/10. Last eliquis given on 7/9 0930AM.    PTT low this PM   Goal of Therapy:  Heparin level 0.3-0.7 units/ml aPTT 66-102 seconds Monitor platelets by anticoagulation protocol: Yes   Plan:  Increase heparin to 1000 units / hr Heparin level and PTT in 8 hours  Thank you Anette Guarneri, PharmD  06/15/2022 9:42 PM

## 2022-06-15 NOTE — Progress Notes (Signed)
Primary Cardiologist:  Johney Frame  Subjective:  Denies SSCP, palpitations or Dyspnea  Objective:  Vitals:   06/15/22 0324 06/15/22 0339 06/15/22 0421 06/15/22 0455  BP: 114/70   (!) 98/59  Pulse: (!) 116 93 97 82  Resp: '18 17 17 19  '$ Temp: 98.8 F (37.1 C)   98.9 F (37.2 C)  TempSrc: Axillary   Axillary  SpO2: 97% 93% 95% 94%  Weight:      Height:        Intake/Output from previous day: No intake or output data in the 24 hours ending 06/15/22 0847  Physical Exam: Chronically ill black female Right tunneled dialysis catheter removed 06/14/22  LUE fistula with thrill SEM Lungs clear Abdomen benign No edema   Lab Results: Basic Metabolic Panel: Recent Labs    06/13/22 0444 06/14/22 0113  NA 129* 135  K 5.0 3.9  CL 97* 92*  CO2 14* 18*  GLUCOSE 70 63*  BUN 94* 43*  CREATININE 12.08* 7.05*  CALCIUM 9.0 9.0  PHOS 8.9*  --    Liver Function Tests: Recent Labs    06/12/22 1545 06/13/22 0444  AST 16  --   ALT 10  --   ALKPHOS 56  --   BILITOT 1.1  --   PROT 7.0  --   ALBUMIN 3.0* 2.6*   Recent Labs    06/12/22 1545  LIPASE 52*   CBC: Recent Labs    06/14/22 0113 06/15/22 0243  WBC 11.9* 7.4  NEUTROABS 10.2* 5.9  HGB 11.6* 10.9*  HCT 33.3* 32.2*  MCV 94.3 95.3  PLT 150 155    Thyroid Function Tests: Recent Labs    06/14/22 1304  TSH 5.093*   Anemia Panel: No results for input(s): "VITAMINB12", "FOLATE", "FERRITIN", "TIBC", "IRON", "RETICCTPCT" in the last 72 hours.  Imaging: No results found.  Cardiac Studies:  ECG: afib nonspecific ST changes    Echo: 03/03/22 IMPRESSIONS     1. Left ventricular ejection fraction, by estimation, is 60 to 65%. The  left ventricle has normal function. The left ventricle has no regional  wall motion abnormalities. Left ventricular diastolic parameters are  consistent with Grade I diastolic  dysfunction (impaired relaxation).   2. Right ventricular systolic function is normal. The right  ventricular  size is normal.   3. Highly mobile mass in the right atrium the origin/attachment site is  not well visualized. This appears smaller in size than previously noted  right atrial mass.   4. The mitral valve is normal in structure. Trivial mitral valve  regurgitation. No evidence of mitral stenosis.   5. Tricuspid valve regurgitation is mild to moderate.   6. Highly mobile 0.836 cm x 0.694 cm mass in the RVOT appears to be  attached to the annulus of the aortic valve above the right cusp, present  on previous study. The aortic valve is normal in structure. Aortic valve  regurgitation is not visualized.  Aortic valve sclerosis is present, with no evidence of aortic valve  stenosis.   7. There is dilatation of the ascending aorta, measuring 39 mm.   8. The inferior vena cava is normal in size with greater than 50%  respiratory variability, suggesting right atrial pressure of 3 mmHg.   Comparison(s): No significant change from prior study.   Medications:    Chlorhexidine Gluconate Cloth  6 each Topical Q0600   Chlorhexidine Gluconate Cloth  6 each Topical Q0600   diltiazem  60 mg Oral Q8H  pantoprazole  40 mg Oral BID   sevelamer carbonate  1,600 mg Oral TID WC   vancomycin variable dose per unstable renal function (pharmacist dosing)   Does not apply See admin instructions      diltiazem (CARDIZEM) infusion 15 mg/hr (06/15/22 0159)   heparin 800 Units/hr (06/15/22 0725)    Assessment/Plan:   SBE/Bacteremia: MRSA with recurrent positive BC;s. Not clear why dialysis catheter not changed in January.Removed 06/14/22 She is getting her dialysis from fistula now Repeat TEE scheduled for Thursday She has had TEE before Risks including esophageal injury intubation discussed willing to proceed CRF:  Inpatient dialysis from fistula d/c line ID:  Multiple issues including discitis, GB ? Tumor likely line infection with bacteremia currently on Vanc with ID following Afib:   continue cardizem drip eliquis held on heparin for line removal can resume eliquis after procedure home dose 2.5 mg bid  Transition to oral cardizem and eliquis next ? 24 hours   Jenkins Rouge 06/15/2022, 8:47 AM

## 2022-06-15 NOTE — Progress Notes (Signed)
Received patient in bed, alert and oriented. Informed consent signed and in chart.  Time tx completed: 1830  HD treatment completed. Patient tolerated well. Fistula/Graft/HD catheter without signs and symptoms of complications. Patient transported back to the room, alert and orient and in no acute distress. Report given to bedside RN.  Total UF removed:  Medication given:  Vancomycin 500 mg   Post HD VS:  128/77 98.7  96 HR Resp 16 O2: 100%  Post HD weight: 56.8kg

## 2022-06-15 NOTE — Progress Notes (Signed)
  Progress Note    06/15/2022 7:33 AM * No surgery date entered *  Subjective:  no complaints. Says neck is a little sore   Vitals:   06/15/22 0421 06/15/22 0455  BP:  (!) 98/59  Pulse: 97 82  Resp: 17 19  Temp:  98.9 F (37.2 C)  SpO2: 95% 94%   Physical Exam: Cardiac:  regular Lungs:  non labored Incisions:  exit site for right IJ TDC dressings c/d/I. Mild swelling along tunnel. No bleeding Neurologic: alert and oriented  CBC    Component Value Date/Time   WBC 7.4 06/15/2022 0243   RBC 3.38 (L) 06/15/2022 0243   HGB 10.9 (L) 06/15/2022 0243   HCT 32.2 (L) 06/15/2022 0243   PLT 155 06/15/2022 0243   MCV 95.3 06/15/2022 0243   MCH 32.2 06/15/2022 0243   MCHC 33.9 06/15/2022 0243   RDW 18.4 (H) 06/15/2022 0243   LYMPHSABS 0.6 (L) 06/15/2022 0243   MONOABS 0.9 06/15/2022 0243   EOSABS 0.0 06/15/2022 0243   BASOSABS 0.0 06/15/2022 0243    BMET    Component Value Date/Time   NA 135 06/14/2022 0113   K 3.9 06/14/2022 0113   CL 92 (L) 06/14/2022 0113   CO2 18 (L) 06/14/2022 0113   GLUCOSE 63 (L) 06/14/2022 0113   BUN 43 (H) 06/14/2022 0113   CREATININE 7.05 (H) 06/14/2022 0113   CREATININE 5.76 (H) 12/31/2020 1456   CALCIUM 9.0 06/14/2022 0113   GFRNONAA 6 (L) 06/14/2022 0113   GFRAA 14 (L) 11/12/2019 0344    INR    Component Value Date/Time   INR 1.2 06/12/2022 1545    No intake or output data in the 24 hours ending 06/15/22 0733   Assessment/Plan:  71 y.o. female who had right IJ TDC removed yesterday. Her site is dressed clean and dry. There is some swelling along catheter site but it is soft. Likely has small hematoma as she is on Apixaban. No bleeding issues. Vascular will be available as needed   DVT prophylaxis:  Apixaban   Karoline Caldwell, PA-C [facility] [phone] 06/15/2022 7:33 AM

## 2022-06-15 NOTE — Consult Note (Signed)
Diamond [name] 02-05-1951  [id number].    Requesting MD: Dr. Lala Lund Chief Complaint/Reason for Consult: gallbladder mass  HPI:  This is a 71 yo black female with a history of ESRD on HD (TTHS), a fib on eliquis, currently on heparin gtt, HTN, anemia of chronic disease, who is currently here and being treated for MRSA bacteremia from her HD catheter.  She is currently on abx therapy for this.  She does have a history of infected right atrial thrombus/vegetation in January of 2023 and completed 8 weeks of vancomycin.  During this hospitalization, she was noted to have possible cholecystitis.  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  She was seen by general surgery with outpatient follow up arranged for follow up with Dr. Zenia Resides once her endocarditis had resolved.  The patient did not follow up.    She is currently here with MRSA bacteremia.  She still has no abdominal complaints.  She has no recollection of every being told she had a gallbladder mass that was likely cancer or that she needed to follow up with the surgical team for surgical arrangement.  We have been asked to see her here to discuss follow up and arrange any further surgical needs.  ROS: ROS: see HPI, uses a walker to get around.  Lives with daughter.  Daughter helps her with ADLs.  Uses wheelchair for long distances.  Family History  Problem Relation Age of Onset   Hypertension Mother    Hypertension Father    Colon cancer Brother    Lung cancer Brother     Past Medical History:  Diagnosis Date   Anemia    Asthma    Atrial flutter with rapid ventricular response ([place]) 12/25/2021   Chronic kidney disease    dialysis Tues Thurs Sat   Dysrhythmia    PAF 10/2019 in setting of COVID-19 PNA   Gout    Hypertension     Past Surgical History:  Procedure Laterality Date   AV FISTULA PLACEMENT Left 12/23/2021    Procedure: LEFT ARM BRACHIOBASILIC VEIN ARTERIOVENOUS (AV) FISTULA CREATION;  Surgeon: [name], MD;  Location: [facility];  Service: Vascular;  Laterality: Left;   [place] Left 03/08/2022   Procedure: LEFT SECOND STAGE BASILIC VEIN TRANSPOSITION;  Surgeon: [name], MD;  Location: [facility];  Service: Vascular;  Laterality: Left;   BIOPSY  01/19/2022   Procedure: BIOPSY;  Surgeon: Lavena Bullion, DO;  Location: [facility];  Service: Gastroenterology;;   [place]   COLONOSCOPY Left 01/19/2022   Procedure: COLONOSCOPY;  Surgeon: Lavena Bullion, DO;  Location: [facility];  Service: Gastroenterology;  Laterality: Left;   ESOPHAGOGASTRODUODENOSCOPY Left 01/19/2022   Procedure: ESOPHAGOGASTRODUODENOSCOPY (EGD);  Surgeon: Lavena Bullion, DO;  Location[facility];  Service: Gastroenterology;  Laterality: Left;   INSERTION OF DIALYSIS CATHETER Right 12/23/2021   Procedure: INSERTION OF DIALYSIS CATHETER;  Surgeon: [name], MD;  Location: [facility];  Service: Vascular;  Laterality: Right;   POLYPECTOMY  01/19/2022   Procedure: POLYPECTOMY;  Surgeon: Lavena Bullion, DO;  Location: [facility];  Service: Gastroenterology;;   TEE WITHOUT CARDIOVERSION N/A 12/22/2021   Procedure: TRANSESOPHAGEAL ECHOCARDIOGRAM (TEE);  Surgeon: Jerline Pain, MD;  Location: [facility] ENDOSCOPY;  Service: Cardiovascular;  Laterality: N/A;   ULTRASOUND GUIDANCE FOR VASCULAR ACCESS  12/23/2021   Procedure:  ULTRASOUND GUIDANCE FOR VASCULAR ACCESS;  Surgeon: [name], MD;  Location: [facility];  Service: Vascular;;    Social History:  reports that she has never smoked. She has never been exposed to tobacco smoke. She has never used smokeless tobacco. She reports that she does not drink alcohol and does not use drugs.  Allergies:  Allergies  Allergen Reactions   Shellfish Allergy     Unknown reaction     Medications Prior to Admission  Medication Sig Dispense Refill    acetaminophen (TYLENOL) 500 MG tablet Take 1,000 mg by mouth every 6 (six) hours as needed for moderate pain.     albuterol (VENTOLIN HFA) 108 (90 Base) MCG/ACT inhaler Inhale 2 puffs into the lungs every 4 (four) hours as needed for wheezing or shortness of breath.     allopurinol (ZYLOPRIM) 100 MG tablet Take 100 mg by mouth 2 (two) times daily.     apixaban (ELIQUIS) 2.5 MG TABS tablet Take 1 tablet (2.5 mg total) by mouth 2 (two) times daily. 60 tablet 11   cyclobenzaprine (FLEXERIL) 5 MG tablet Take 2.5 mg by mouth daily as needed for muscle spasms.     fluticasone-salmeterol (ADVAIR) 250-50 MCG/ACT AEPB Inhale 1 puff into the lungs 2 (two) times daily.     hydrALAZINE (APRESOLINE) 50 MG tablet Take 1 tablet (50 mg total) by mouth 3 (three) times daily. 270 tablet 3   Lidocaine 4 % PTCH Apply 1 patch topically daily as needed (pain).     metoprolol tartrate (LOPRESSOR) 50 MG tablet Take 1 tablet (50 mg total) by mouth 2 (two) times daily.     ondansetron (ZOFRAN-ODT) 4 MG disintegrating tablet Take 1 tablet (4 mg total) by mouth every 8 (eight) hours as needed for nausea or vomiting. 20 tablet 0   oxyCODONE (ROXICODONE) 5 MG immediate release tablet Take 1 tablet (5 mg total) by mouth every 6 (six) hours as needed for severe pain. 20 tablet 0   pantoprazole (PROTONIX) 40 MG tablet Take 1 tablet (40 mg total) by mouth 2 (two) times daily. 60 tablet 0   verapamil (CALAN) 120 MG tablet Take 120 mg by mouth 3 (three) times daily. Take one tablet three times a day for 90 days.     sevelamer carbonate (RENVELA) 800 MG tablet Take 2 tablets (1,600 mg total) by mouth 3 (three) times daily with meals. (Patient not taking: Reported on 06/13/2022)       Physical Exam: Blood pressure 125/80, pulse 99, temperature 98.3 F (36.8 C), temperature source Axillary, resp. rate 17, height '5\' 7"'$  (1.702 m), weight 57.3 kg, SpO2 97 %. General: pleasant, WD, WN black female who is sitting up in a chair in NAD, but  appears a bit out of it. HEENT: head is normocephalic, atraumatic.  Sclera are noninjected.  PERRL.  Ears and nose without any masses or lesions.  Mouth is pink and moist Heart: irregular.  Normal s1,s2. No obvious murmurs, gallops, or rubs noted.  Palpable radial pulses bilaterally Lungs: CTAB, no wheezes, rhonchi, or rales noted.  Respiratory effort nonlabored Abd: soft, mildly tender in RUQ, ND, +BS, no masses, hernias, or organomegaly Psych: A&Ox3 but with slowed and delayed mentation to answer questions.   Results for orders placed or performed during the hospital encounter of 06/12/22 (from the past 48 hour(s))  Hepatitis B surface antigen     Status: None   Collection Time: 06/13/22  1:08 PM  Result Value Ref Range   Hepatitis  B Surface Ag NON REACTIVE NON REACTIVE    Comment: Performed at [facility], [place] [address], [place], [place] [address]  Hepatitis B surface antibody     Status: None   Collection Time: 06/13/22  1:08 PM  Result Value Ref Range   Hep B S Ab NON REACTIVE NON REACTIVE    Comment: (NOTE) Inconsistent with immunity, less than 10 mIU/mL.  Performed at [facility], [place] [address], [place], [place] [address]   Hepatitis B core antibody, total     Status: Abnormal   Collection Time: 06/13/22  1:08 PM  Result Value Ref Range   Hep B Core Total Ab Reactive (A) NON REACTIVE    Comment: Performed at [facility] [address], [place], [place] [address]  Hepatitis C antibody     Status: Abnormal   Collection Time: 06/13/22  1:08 PM  Result Value Ref Range   HCV Ab Reactive (A) NON REACTIVE    Comment: (NOTE) The CDC recommends that a Reactive HCV antibody result be followed up  with a HCV Nucleic Acid Amplification test.  Performed at [facility], [place] [address] [place], [place] [address]   Culture, blood (Routine X 2) w Reflex to ID Panel     Status: None (Preliminary result)   Collection Time: 06/14/22  1:08 AM   Specimen:  BLOOD  Result Value Ref Range   Specimen Description BLOOD RIGHT ANTECUBITAL    Special Requests      BOTTLES DRAWN AEROBIC ONLY Blood Culture adequate volume   Culture      NO GROWTH 1 DAY Performed at [facility], [place] [address], [place], [place] [address]    Report Status PENDING   CBC with Differential/Platelet     Status: Abnormal   Collection Time: 06/14/22  1:13 AM  Result Value Ref Range   WBC 11.9 (H) 4.0 - 10.5 K/uL   RBC 3.53 (L) 3.87 - 5.11 MIL/uL   Hemoglobin 11.6 (L) 12.0 - 15.0 g/dL   HCT 33.3 (L) 36.0 - 46.0 %   MCV 94.3 80.0 - 100.0 fL   MCH 32.9 26.0 - 34.0 pg   MCHC 34.8 30.0 - 36.0 g/dL   RDW 18.3 (H) 11.5 - 15.5 %   Platelets 150 150 - 400 K/uL   nRBC 0.0 0.0 - 0.2 %   Neutrophils Relative % 86 %   Neutro Abs 10.2 (H) 1.7 - 7.7 K/uL   Lymphocytes Relative 5 %   Lymphs Abs 0.6 (L) 0.7 - 4.0 K/uL   Monocytes Relative 8 %   Monocytes Absolute 1.0 0.1 - 1.0 K/uL   Eosinophils Relative 0 %   Eosinophils Absolute 0.0 0.0 - 0.5 K/uL   Basophils Relative 0 %   Basophils Absolute 0.0 0.0 - 0.1 K/uL   Immature Granulocytes 1 %   Abs Immature Granulocytes 0.08 (H) 0.00 - 0.07 K/uL    Comment: Performed at [facility], [address], [place],  [address]  Basic metabolic panel     Status: Abnormal   Collection Time: 06/14/22  1:13 AM  Result Value Ref Range   Sodium 135 135 - 145 mmol/L   Potassium 3.9 3.5 - 5.1 mmol/L    Comment: DELTA CHECK NOTED DIALYSIS    Chloride 92 (L) 98 - 111 mmol/L   CO2 18 (L) 22 - 32 mmol/L   Glucose, Bld 63 (L) 70 - 99 mg/dL  Comment: Glucose reference range applies only to samples taken after fasting for at least 8 hours.   BUN 43 (H) 8 - 23 mg/dL   Creatinine, Ser 7.05 (H) 0.44 - 1.00 mg/dL    Comment: DELTA CHECK NOTED DIALYSIS    Calcium 9.0 8.9 - 10.3 mg/dL   GFR, Estimated 6 (L) >60 mL/min    Comment: (NOTE) Calculated using the CKD-EPI Creatinine Equation (2021)    Anion gap 25 (H) 5 - 15     Comment: Performed at [facility] [address], [place], [place] [address] CORRECTED ON 07/10 AT 0246: PREVIOUSLY REPORTED AS 25 Electrolytes repeated to confirm.   Lactic acid, plasma     Status: None   Collection Time: 06/14/22  1:13 AM  Result Value Ref Range   Lactic Acid, Venous 1.0 0.5 - 1.9 mmol/L    Comment: Performed at [facility] [address] [place], [place] [address]  Culture, blood (Routine X 2) w Reflex to ID Panel     Status: None (Preliminary result)   Collection Time: 06/14/22  1:13 AM   Specimen: BLOOD RIGHT HAND  Result Value Ref Range   Specimen Description BLOOD RIGHT HAND    Special Requests      BOTTLES DRAWN AEROBIC AND ANAEROBIC Blood Culture adequate volume   Culture      NO GROWTH 1 DAY Performed at [facility], [place] [address], [place], [place] [address]    Report Status PENDING   TSH     Status: Abnormal   Collection Time: 06/14/22  1:04 PM  Result Value Ref Range   TSH 5.093 (H) 0.350 - 4.500 uIU/mL    Comment: Performed by a 3rd Generation assay with a functional sensitivity of <=0.01 uIU/mL. Performed at [facility], [place] [address], [place], [place] [address]   Heparin level (unfractionated)     Status: Abnormal   Collection Time: 06/14/22  1:04 PM  Result Value Ref Range   Heparin Unfractionated >1.10 (H) 0.30 - 0.70 IU/mL    Comment: (NOTE) The clinical reportable range upper limit is being lowered to >1.10 to align with the FDA approved guidance for the current laboratory assay.  If heparin results are below expected values, and patient dosage has  been confirmed, suggest follow up testing of antithrombin III levels. Performed at [facility], [place] [address] [place], [place] [address]   APTT     Status: None   Collection Time: 06/14/22  1:04 PM  Result Value Ref Range   aPTT 35 24 - 36 seconds    Comment: Performed at [facility] [address], [place], [place] [address]  Cath Tip  Culture     Status: None (Preliminary result)   Collection Time: 06/14/22  3:23 PM   Specimen: Catheter Tip; Other  Result Value Ref Range   Specimen Description CATH TIP    Special Requests NONE    Culture      CULTURE REINCUBATED FOR BETTER GROWTH Performed at [facility], [place] [address] [place], [place] [address]    Report Status PENDING   CBC with Differential/Platelet     Status: Abnormal   Collection Time: 06/15/22  2:43 AM  Result Value Ref Range   WBC 7.4 4.0 - 10.5 K/uL   RBC 3.38 (L) 3.87 - 5.11 MIL/uL   Hemoglobin 10.9 (L) 12.0 - 15.0 g/dL   HCT 32.2 (L) 36.0 - 46.0 %  MCV 95.3 80.0 - 100.0 fL   MCH 32.2 26.0 - 34.0 pg   MCHC 33.9 30.0 - 36.0 g/dL   RDW 18.4 (H) 11.5 - 15.5 %   Platelets 155 150 - 400 K/uL   nRBC 0.0 0.0 - 0.2 %   Neutrophils Relative % 78 %   Neutro Abs 5.9 1.7 - 7.7 K/uL   Lymphocytes Relative 9 %   Lymphs Abs 0.6 (L) 0.7 - 4.0 K/uL   Monocytes Relative 12 %   Monocytes Absolute 0.9 0.1 - 1.0 K/uL   Eosinophils Relative 0 %   Eosinophils Absolute 0.0 0.0 - 0.5 K/uL   Basophils Relative 0 %   Basophils Absolute 0.0 0.0 - 0.1 K/uL   Immature Granulocytes 1 %   Abs Immature Granulocytes 0.06 0.00 - 0.07 K/uL    Comment: Performed at [facility] [address] [place], [place] [address]  APTT     Status: Abnormal   Collection Time: 06/15/22  8:57 AM  Result Value Ref Range   aPTT 39 (H) 24 - 36 seconds    Comment:        IF BASELINE aPTT IS ELEVATED, SUGGEST PATIENT RISK ASSESSMENT BE USED TO DETERMINE APPROPRIATE ANTICOAGULANT THERAPY. Performed at [facility], [place] [address] [place], [place] [address]    No results found.    Assessment/Plan Gallbladder mass, likely malignant The patient has been seen and examined, chart, labs, imaging all personally reviewed.  Her imaging is stable and still c/w likely gallbladder malignancy.  She has no recollection of this or the fact that she was supposed to follow up as  an outpatient.  This is certainly more concerning to think we can get reasonable follow up set up for her.  We will recheck CA 19-9/AFP.  We will also repeat a CT chest since it has been about 6 months since the last to assure no further evidence of metastatic disease.  Pending this is negative, Dr. Zenia Resides is going to see her later today to further determine plan for surgical intervention whether acutely here in the hospital after TEE, etc vs follow up after recovery from this event.  Hold her Eliquis until Dr. Zenia Resides has seen her.  Ok to continue her heparin gtt otherwise.  We will follow along.   FEN - HH VTE - heparin gtt ID - vanc   MRSA bacteremia A fib/flutter on heparin gtt currently ESRD - on HD.  TTHS HTN Anemia of chronic disease  I reviewed Consultant cardiology notes, hospitalist notes, last 24 h vitals and pain scores, last 48 h intake and output, last 24 h labs and trends, and last 24 h imaging results.  Henreitta Cea, [facility] Surgery 06/15/2022, 10:29 AM Please see Amion for pager number during day hours 7:00am-4:30pm or 7:00am -11:30am on weekends

## 2022-06-15 NOTE — Progress Notes (Addendum)
Addendum - Canceling restart Eliquis d/t possible surgery, continue IV heparin See plan below  ANTICOAGULATION CONSULT NOTE - [facility] for transition heparin to Eliquis Indication:  history of Atrial fibrillation   Allergies  Allergen Reactions   Shellfish Allergy     Unknown reaction     Patient Measurements: Height: '5\' 7"'$  (170.2 cm) Weight: 57.3 kg (126 lb 5.2 oz) IBW/kg (Calculated) : 61.6 Heparin Dosing Weight: 57.3 kg  Vital Signs: Temp: 98.3 F (36.8 C) (07/11 0850) Temp Source: Axillary (07/11 0850) BP: 125/80 (07/11 0850) Pulse Rate: 99 (07/11 0850)  Labs: Recent Labs    06/12/22 1545 06/12/22 1649 06/12/22 1706 06/13/22 0444 06/14/22 0113 06/14/22 1304 06/15/22 0243 06/15/22 0857  HGB 12.7 13.9  14.3  --  11.2* 11.6*  --  10.9*  --   HCT 37.5 41.0  42.0  --  34.7* 33.3*  --  32.2*  --   PLT 157  --   --  136* 150  --  155  --   APTT 37*  --   --   --   --  35  --  39*  LABPROT 15.2  --   --   --   --   --   --   --   INR 1.2  --   --   --   --   --   --   --   HEPARINUNFRC  --   --   --   --   --  >1.10*  --   --   CREATININE 11.73* 13.60*  --  12.08* 7.05*  --   --   --   TROPONINIHS 19*  --  22*  --   --   --   --   --      Estimated Creatinine Clearance: 6.5 mL/min (A) (by C-G formula based on SCr of 7.05 mg/dL (H)).   Medical History: Past Medical History:  Diagnosis Date   Anemia    Asthma    Atrial flutter with rapid ventricular response ([place]) 12/25/2021   Chronic kidney disease    dialysis Tues Thurs Sat   Dysrhythmia    PAF 10/2019 in setting of COVID-19 PNA   Gout    Hypertension     Medications:  Medications Prior to Admission  Medication Sig Dispense Refill Last Dose   acetaminophen (TYLENOL) 500 MG tablet Take 1,000 mg by mouth every 6 (six) hours as needed for moderate pain.   Past Week   albuterol (VENTOLIN HFA) 108 (90 Base) MCG/ACT inhaler Inhale 2 puffs into the lungs every 4 (four) hours as needed  for wheezing or shortness of breath.   unknown   allopurinol (ZYLOPRIM) 100 MG tablet Take 100 mg by mouth 2 (two) times daily.   Past Week   apixaban (ELIQUIS) 2.5 MG TABS tablet Take 1 tablet (2.5 mg total) by mouth 2 (two) times daily. 60 tablet 11 06/11/2022 at 7:00pm   cyclobenzaprine (FLEXERIL) 5 MG tablet Take 2.5 mg by mouth daily as needed for muscle spasms.   unknown   fluticasone-salmeterol (ADVAIR) 250-50 MCG/ACT AEPB Inhale 1 puff into the lungs 2 (two) times daily.   Past Week   hydrALAZINE (APRESOLINE) 50 MG tablet Take 1 tablet (50 mg total) by mouth 3 (three) times daily. 270 tablet 3 Past Week   Lidocaine 4 % PTCH Apply 1 patch topically daily as needed (pain).   unknown   metoprolol tartrate (LOPRESSOR) 50  MG tablet Take 1 tablet (50 mg total) by mouth 2 (two) times daily.   06/11/2022 at 7:00pm   ondansetron (ZOFRAN-ODT) 4 MG disintegrating tablet Take 1 tablet (4 mg total) by mouth every 8 (eight) hours as needed for nausea or vomiting. 20 tablet 0 unknown   oxyCODONE (ROXICODONE) 5 MG immediate release tablet Take 1 tablet (5 mg total) by mouth every 6 (six) hours as needed for severe pain. 20 tablet 0 unknown   pantoprazole (PROTONIX) 40 MG tablet Take 1 tablet (40 mg total) by mouth 2 (two) times daily. 60 tablet 0 Past Week   verapamil (CALAN) 120 MG tablet Take 120 mg by mouth 3 (three) times daily. Take one tablet three times a day for 90 days.   Past Week   sevelamer carbonate (RENVELA) 800 MG tablet Take 2 tablets (1,600 mg total) by mouth 3 (three) times daily with meals. (Patient not taking: Reported on 06/13/2022)   Not Taking    [name] with history of atrial fibrillation on Eliquis prior to admission. Eliquis held for proceure. HD catheter removal 7/10, IV heparin started 7/11. Will monitor aPTTs due to apixaban's effect on elevated heparin levels, until heparin level and aPTT correlate. Hgb stable at 10.9, pltc 155.  No bleeding noted.   IV heparin 800  units/hr started 0730 today. Baseline heparin level and aPTT obtained.   Goal of Therapy:  Heparin level 0.3-0.7 units/ml aPTT 66-102 seconds Monitor platelets by anticoagulation protocol: Yes   Plan:   Continue IV Heparin infusion 800 units/hr (no bolus) F/u 8 hr aPTT (secondary to PTA apix) Daily aPTT, HL , CBC F/u eliquis for restart.    Francena Hanly, Pharm.D. [place] PGY1 Pharmacy Resident   06/15/2022 11:11 AM

## 2022-06-15 NOTE — Progress Notes (Signed)
PROGRESS NOTE                                                                                                                                                                                                             Patient Demographics:    Diamond [name], is a 71 y.o. female, DOB - 1951-05-27, DDU:[id number]  Outpatient Primary MD for the patient is Osei-Bonsu, Iona Beard, MD    LOS - 3  Admit date - 06/12/2022    No chief complaint on file.      Brief Narrative (HPI from H&P)   71 yo female with the past medical history of ESRD on HD, anemia, hypertension and hx MRSA  L5 S1 discitis and endocarditis who presented with abdominal pain, associated with diarrhea, nausea and vomiting for 2 days. Patient has missed 2 HD sessions.  Further work-up here showed chronic gallbladder changes however she was found to have Staph aureus bacteremia with suspected site of infection being her HD dialysis catheter.  She was seen by ID and nephrology and admitted for further care.   Subjective:   Patient in bed, appears comfortable, denies any headache, no fever, no chest pain or pressure, no shortness of breath , no abdominal pain. No focal weakness.   Assessment  & Plan :    Staph aureus bacteremia - MRSA in a patient with history of ESRD with right subclavian tunneled HD catheter in place.  Present on admission. She is currently stable on IV antibiotics and symptom-free, ID and nephrology are following the patient, possible source was right HD catheter which was removed by vascular surgery on 06/14/2022, she has a left arm AV fistula which is functional.  Continue antibiotics ,defer management of this issue to nephrology and ID, TEE requested due on 06/17/2022  Known history of gallbladder mass - has unknown gallbladder mass/growth for several months.  Post to follow outpatient with general surgery but never showed up for appointments, I have  requested general surgery to evaluate the patient while she is here so that this issue does not fall through the cracks.  Hypertension - Uncontrolled hypertension, continue blood pressure control with metoprolol.  ESRD on dialysis [facility]) - MWF, renal following.  Anemia of chronic renal disease, hgb has been stable.   Paroxysmal atrial flutter (HCC) - in RVR, placed on Cardizem drip, Cardizem added,  stable TSH, cardiology called, will try to titrate off drip if rate remains uncontrolled on oral Cardizem, resume Eliquis.  Acute metabolic encephalopathy -  Patient awake and alert, non focal, encephalopathy much better likely was due to sepsis.       Condition - Extremely Guarded  Family Communication  : None present  Code Status :  Full  Consults  :  ID, Renal, Cards  PUD Prophylaxis :    Procedures  :     Right subclavian dialysis catheter removed on 06/14/2022 by VVS.  TEE      Disposition Plan  :    Status is: Inpatient   DVT Prophylaxis  :     Lab Results  Component Value Date   PLT 155 06/15/2022    Diet :  Diet Order             Diet NPO time specified  Diet effective 0500           Diet Heart Room service appropriate? Yes; Fluid consistency: Thin  Diet effective now                    Inpatient Medications  Scheduled Meds:  Chlorhexidine Gluconate Cloth  6 each Topical Q0600   Chlorhexidine Gluconate Cloth  6 each Topical Q0600   diltiazem  60 mg Oral Q8H   pantoprazole  40 mg Oral BID   sevelamer carbonate  1,600 mg Oral TID WC   vancomycin variable dose per unstable renal function (pharmacist dosing)   Does not apply See admin instructions   Continuous Infusions:  diltiazem (CARDIZEM) infusion 15 mg/hr (06/15/22 0159)   heparin 800 Units/hr (06/15/22 0725)   PRN Meds:.acetaminophen, hydrALAZINE, HYDROmorphone (DILAUDID) injection, lidocaine HCl (PF), oxyCODONE, prochlorperazine  Time Spent in minutes  30   Lala Lund M.D on  06/15/2022 at 9:22 AM  To page go to [email or web address]   [facility] -  Office  [phone]  See all Orders from today for further details    Objective:   Vitals:   06/15/22 0339 06/15/22 0421 06/15/22 0455 06/15/22 0850  BP:   (!) 98/59 125/80  Pulse: 93 97 82 99  Resp: '17 17 19 17  '$ Temp:   98.9 F (37.2 C) 98.3 F (36.8 C)  TempSrc:   Axillary Axillary  SpO2: 93% 95% 94% 97%  Weight:      Height:        Wt Readings from Last 3 Encounters:  06/14/22 57.3 kg  03/15/22 62.1 kg  03/08/22 59.9 kg    No intake or output data in the 24 hours ending 06/15/22 6063    Physical Exam  Awake Alert, No new F.N deficits,  left arm AV fistula in place, [place].AT,PERRAL Supple Neck, No JVD,   Symmetrical Chest wall movement, Good air movement bilaterally, CTAB iRRR,No Gallops,Rubs or new Murmurs,  +ve B.Sounds, Abd Soft, No tenderness,   No Cyanosis, Clubbing or edema     RN pressure injury documentation:     Data Review:    CBC Recent Labs  Lab 06/12/22 1545 06/12/22 1649 06/13/22 0444 06/14/22 0113 06/15/22 0243  WBC 8.6  --  7.5 11.9* 7.4  HGB 12.7 13.9  14.3 11.2* 11.6* 10.9*  HCT 37.5 41.0  42.0 34.7* 33.3* 32.2*  PLT 157  --  136* 150 155  MCV 95.9  --  98.6 94.3 95.3  MCH 32.5  --  31.8 32.9 32.2  MCHC 33.9  --  32.3 34.8  33.9  RDW 18.5*  --  18.5* 18.3* 18.4*  LYMPHSABS 0.3*  --  0.5* 0.6* 0.6*  MONOABS 0.6  --  0.6 1.0 0.9  EOSABS 0.0  --  0.0 0.0 0.0  BASOSABS 0.0  --  0.0 0.0 0.0    Electrolytes Recent Labs  Lab 06/12/22 1545 06/12/22 1640 06/12/22 1649 06/13/22 0444 06/14/22 0113 06/14/22 1304  NA 129*  --  127*  128* 129* 135  --   K 5.6*  --  5.5*  5.4* 5.0 3.9  --   CL 94*  --  99 97* 92*  --   CO2 16*  --   --  14* 18*  --   GLUCOSE 90  --  83 70 63*  --   BUN 91*  --  80* 94* 43*  --   CREATININE 11.73*  --  13.60* 12.08* 7.05*  --   CALCIUM 9.3  --   --  9.0 9.0  --   AST 16  --   --   --   --   --   ALT 10  --   --    --   --   --   ALKPHOS 56  --   --   --   --   --   BILITOT 1.1  --   --   --   --   --   ALBUMIN 3.0*  --   --  2.6*  --   --   LATICACIDVEN 1.0 1.5  --   --  1.0  --   INR 1.2  --   --   --   --   --   TSH  --   --   --   --   --  5.093*  AMMONIA 40*  --   --   --   --   --   BNP 814.2*  --   --   --   --   --    ------------------------------------------------------------------------------------------------------------------ ID Labs Recent Labs  Lab 06/12/22 1545 06/12/22 1640 06/12/22 1649 06/13/22 0444 06/14/22 0113 06/15/22 0243  WBC 8.6  --   --  7.5 11.9* 7.4  PLT 157  --   --  136* 150 155  LATICACIDVEN 1.0 1.5  --   --  1.0  --   CREATININE 11.73*  --  13.60* 12.08* 7.05*  --    Radiology Reports CT ABDOMEN PELVIS W CONTRAST  Result Date: 06/12/2022 CLINICAL DATA:  Abdominal pain and diarrhea for 2 days EXAM: CT ABDOMEN AND PELVIS WITH CONTRAST TECHNIQUE: Multidetector CT imaging of the abdomen and pelvis was performed using the standard protocol following bolus administration of intravenous contrast. RADIATION DOSE REDUCTION: This exam was performed according to the departmental dose-optimization program which includes automated exposure control, adjustment of the mA and/or kV according to patient size and/or use of iterative reconstruction technique. CONTRAST:  165m OMNIPAQUE IOHEXOL 300 MG/ML  SOLN COMPARISON:  01/16/2022 CT, MRI from 01/17/2022 FINDINGS: Lower chest: No acute abnormality. Hepatobiliary: Liver is well visualized without focal mass lesion. Gallbladder is well distended. Known cholelithiasis is seen and stable. Wall thickening is noted similar to that seen as well. An enhancing mass is again seen in the region of the fundus measuring up to 2.8 cm again suspicious for gallbladder carcinoma. No biliary ductal dilatation is seen. Pancreas: Unremarkable. No pancreatic ductal dilatation or surrounding inflammatory changes. Spleen: Calcified granulomas are noted.  No  mass lesion  is seen. Adrenals/Urinary Tract: Adrenal glands are within normal limits. Kidneys demonstrate a normal enhancement pattern. Scattered cysts are again identified and stable. No follow-up is recommended. Cortical calcifications as well as nonobstructing stones are noted on the right. No obstructive changes are noted. The bladder is decompressed with wall thickening identified Stomach/Bowel: The appendix is well visualized and within normal limits. No obstructive or inflammatory changes of the colon are seen. Small bowel shows no obstructive changes. Stomach is fluid-filled. Vascular/Lymphatic: Aortic atherosclerosis. No enlarged abdominal or pelvic lymph nodes. Reproductive: Uterus and bilateral adnexa are unremarkable. Other: No abdominal wall hernia or abnormality. No abdominopelvic ascites. Musculoskeletal: Degenerative changes of lumbar spine are noted worst at L4-5 with endplate sclerosis and disc space widening likely related to prior discitis. The overall appearance is stable from the prior study. IMPRESSION: Cholelithiasis with wall thickening in the gallbladder as well as a gallbladder fundal mass suspicious for carcinoma. These changes are stable from prior MR and CT. Nonobstructing renal calculi on the right. Chronic changes at L4-L5 likely related to prior discitis. Electronically Signed   By: Inez Catalina M.D.   On: 06/12/2022 22:51   CT Head Wo Contrast  Result Date: 06/12/2022 CLINICAL DATA:  Altered mental status EXAM: CT HEAD WITHOUT CONTRAST TECHNIQUE: Contiguous axial images were obtained from the base of the skull through the vertex without intravenous contrast. RADIATION DOSE REDUCTION: This exam was performed according to the departmental dose-optimization program which includes automated exposure control, adjustment of the mA and/or kV according to patient size and/or use of iterative reconstruction technique. COMPARISON:  None Available. FINDINGS: Brain: No evidence of acute  infarction, hemorrhage, hydrocephalus, extra-axial collection or mass lesion/mass effect. Chronic atrophic and ischemic changes are noted. Vascular: No hyperdense vessel or unexpected calcification. Skull: Normal. Negative for fracture or focal lesion. Sinuses/Orbits: No acute finding. Other: None. IMPRESSION: Chronic atrophic and ischemic changes without acute abnormality. Electronically Signed   By: Inez Catalina M.D.   On: 06/12/2022 22:45   DG Chest 1 View  Result Date: 06/12/2022 CLINICAL DATA:  Questionable sepsis EXAM: CHEST  1 VIEW COMPARISON:  Radiograph 03/09/2022 FINDINGS: Right neck catheter tip overlies the superior cavoatrial junction. Unchanged cardiomediastinal silhouette. There is no focal airspace consolidation. There is no pleural effusion. There is no pneumothorax. There is no acute osseous abnormality. Skin fold overlies the left lower chest. IMPRESSION: No evidence of acute cardiopulmonary disease. Electronically Signed   By: Maurine Simmering M.D.   On: 06/12/2022 16:25

## 2022-06-15 NOTE — Progress Notes (Signed)
CSW left voicemail for patient's son, Marchia Bond.   Gilmore Laroche, MSW, [facility]

## 2022-06-15 NOTE — Progress Notes (Signed)
Attempted to contact cardiology, no response received. Notified Dr. Marlowe Sax. Ordered to notify when patient HR sustained in 120s.

## 2022-06-15 NOTE — Progress Notes (Addendum)
Pharmacy Antibiotic Note  Diamond [name] is a 71 y.o. female admitted on 06/12/2022 with MRSA bacteremia.  Pharmacy has been consulted for vancomycin dosing. Patient got loading dose of vancomycin on 7/9 and post-HD dose on 7/10. Patient resuming HD schedule every Tuesday, Thursday, Saturday.   Plan: Vancomycin 500 mg qHD-TTS Monitor clinical progress, length of therapy, cultures  Assess pre-HD vanc levels per protocol   Height: '5\' 7"'$  (170.2 cm) Weight: 57.3 kg (126 lb 5.2 oz) IBW/kg (Calculated) : 61.6  Temp (24hrs), Avg:98.7 F (37.1 C), Min:97.4 F (36.3 C), Max:99.4 F (37.4 C)  Recent Labs  Lab 06/12/22 1545 06/12/22 1640 06/12/22 1649 06/13/22 0444 06/14/22 0113 06/15/22 0243  WBC 8.6  --   --  7.5 11.9* 7.4  CREATININE 11.73*  --  13.60* 12.08* 7.05*  --   LATICACIDVEN 1.0 1.5  --   --  1.0  --      Estimated Creatinine Clearance: 6.5 mL/min (A) (by C-G formula based on SCr of 7.05 mg/dL (H)).    Allergies  Allergen Reactions   Shellfish Allergy     Unknown reaction     Antimicrobials this admission: Vancomycin 7/8 >>   Microbiology results: 7/8 Bcx 2/2: MRSA 7/10 Bcx: ngtd 7/10 Cath tip: pending 7/11 Bcx: sent   Francena Hanly, Pharm.D. [place] PGY1 Pharmacy Resident   06/15/2022 11:25 AM

## 2022-06-15 NOTE — Care Management Important Message (Signed)
Important Message  Patient Details  Name: Diamond [name] MRN: [id number] Date of Birth: [birth year]   Medicare Important Message Given:  Yes     Orbie Pyo 06/15/2022, 3:14 PM

## 2022-06-15 NOTE — Progress Notes (Signed)
[facility] for Infectious Disease  Date of Admission:  06/12/2022           Reason for visit: Follow up on MRSA bacteremia  Current antibiotics: Vancomycin 7/9 -- present   ASSESSMENT:    71 y.o. female admitted with:  MRSA Bacteremia: Patient admitted with positive blood cultures 06/12/2022.  Repeat cultures obtained 7/10 and HD catheter removed 7/10 as well in the afternoon.  Patient also has a history of MRSA bacteremia complicated by Z6-X0 vertebral infection and mitral valve thickening/fibrinous density (TTE) with possible infected right atrial thrombus/vegetation (TEE) in January 2023.  Status post 8 weeks of vancomycin with appropriate line holiday at that time (old right IJ TDC removed 12/19/21, repeat cx negative, new right IJ TDC placed 12/23/21).  However, suspect her current bacteremia was due to another line infection that remained in place until removal yesterday. End-stage renal disease: On hemodialysis.   RECOMMENDATIONS:    Continue vancomycin per pharmacy TEE on Thursday Will repeat cultures now that Diamond [name] is removed (her current cultures were done while this remained in place) Lab monitoring Will follow   Principal Problem:   MRSA bacteremia Active Problems:   Hypertension   Gallbladder mass   ESRD on dialysis ([place])   Paroxysmal atrial flutter (HCC)   Acute metabolic encephalopathy    MEDICATIONS:    Scheduled Meds:  Chlorhexidine Gluconate Cloth  6 each Topical Q0600   Chlorhexidine Gluconate Cloth  6 each Topical Q0600   diltiazem  60 mg Oral Q8H   pantoprazole  40 mg Oral BID   sevelamer carbonate  1,600 mg Oral TID WC   vancomycin variable dose per unstable renal function (pharmacist dosing)   Does not apply See admin instructions   Continuous Infusions:  diltiazem (CARDIZEM) infusion 15 mg/hr (06/15/22 0159)   heparin 800 Units/hr (06/15/22 0725)   PRN Meds:.acetaminophen, hydrALAZINE, HYDROmorphone (DILAUDID) injection, lidocaine HCl  (PF), oxyCODONE, prochlorperazine  SUBJECTIVE:   24 hour events:  S/p HD catheter removal Tmax 99.4 TEE planned for Thursday Tachycardic Repeat cultures obtained yesterday (prior to catheter removal)   No new complaints today.   Review of Systems  All other systems reviewed and are negative.     OBJECTIVE:   Blood pressure 125/80, pulse 99, temperature 98.3 F (36.8 C), temperature source Axillary, resp. rate 17, height '5\' 7"'$  (1.702 m), weight 57.3 kg, SpO2 97 %. Body mass index is 19.79 kg/m.  Physical Exam Constitutional:      General: She is not in acute distress. HENT:     Head: Normocephalic and atraumatic.  Eyes:     Extraocular Movements: Extraocular movements intact.     Conjunctiva/sclera: Conjunctivae normal.  Cardiovascular:     Comments: HD catheter removed.  Pulmonary:     Effort: Pulmonary effort is normal. No respiratory distress.  Abdominal:     General: There is no distension.     Palpations: Abdomen is soft.  Musculoskeletal:        General: Normal range of motion.     Cervical back: Normal range of motion and neck supple.  Skin:    General: Skin is warm and dry.  Neurological:     General: No focal deficit present.     Mental Status: She is alert and oriented to person, place, and time.  Psychiatric:        Mood and Affect: Mood normal.        Behavior: Behavior normal.  Lab Results: Lab Results  Component Value Date   WBC 7.4 06/15/2022   HGB 10.9 (L) 06/15/2022   HCT 32.2 (L) 06/15/2022   MCV 95.3 06/15/2022   PLT 155 06/15/2022    Lab Results  Component Value Date   NA 135 06/14/2022   K 3.9 06/14/2022   CO2 18 (L) 06/14/2022   GLUCOSE 63 (L) 06/14/2022   BUN 43 (H) 06/14/2022   CREATININE 7.05 (H) 06/14/2022   CALCIUM 9.0 06/14/2022   GFRNONAA 6 (L) 06/14/2022   GFRAA 14 (L) 11/12/2019    Lab Results  Component Value Date   ALT 10 06/12/2022   AST 16 06/12/2022   ALKPHOS 56 06/12/2022   BILITOT 1.1 06/12/2022        Component Value Date/Time   CRP 6.2 (H) 01/21/2022 0228       Component Value Date/Time   ESRSEDRATE 64 (H) 12/17/2021 0630     I have reviewed the micro and lab results in Epic.  Imaging: No results found.   Imaging independently reviewed in Epic.    Raynelle Highland for Infectious Disease [facility] [phone] pager 06/15/2022, 9:49 AM

## 2022-06-15 NOTE — Progress Notes (Signed)
Pt receives out-pt HD at [facility] on TTS. Pt arrives at 11:30 for 11:45 chair time. Will assist as needed.   Melven Sartorius Renal Navigator [phone]

## 2022-06-15 NOTE — Progress Notes (Signed)
ANTICOAGULATION CONSULT NOTE - Initial Consult  Pharmacy Consult for Heparin  Indication:  history of Atrial fibrillation   Allergies  Allergen Reactions   Shellfish Allergy     Unknown reaction     Patient Measurements: Height: '5\' 7"'$  (170.2 cm) Weight: 57.3 kg (126 lb 5.2 oz) IBW/kg (Calculated) : 61.6 Heparin Dosing Weight: 57.3 kg  Vital Signs: Temp: 98.3 F (36.8 C) (07/11 0850) Temp Source: Axillary (07/11 0850) BP: 125/80 (07/11 0850) Pulse Rate: 99 (07/11 0850)  Labs: Recent Labs    06/12/22 1545 06/12/22 1649 06/12/22 1706 06/13/22 0444 06/14/22 0113 06/14/22 1304 06/15/22 0243  HGB 12.7 13.9  14.3  --  11.2* 11.6*  --  10.9*  HCT 37.5 41.0  42.0  --  34.7* 33.3*  --  32.2*  PLT 157  --   --  136* 150  --  155  APTT 37*  --   --   --   --  35  --   LABPROT 15.2  --   --   --   --   --   --   INR 1.2  --   --   --   --   --   --   HEPARINUNFRC  --   --   --   --   --  >1.10*  --   CREATININE 11.73* 13.60*  --  12.08* 7.05*  --   --   TROPONINIHS 19*  --  22*  --   --   --   --      Estimated Creatinine Clearance: 6.5 mL/min (A) (by C-G formula based on SCr of 7.05 mg/dL (H)).   Medical History: Past Medical History:  Diagnosis Date   Anemia    Asthma    Atrial flutter with rapid ventricular response ([place]) 12/25/2021   Chronic kidney disease    dialysis Tues Thurs Sat   Dysrhythmia    PAF 10/2019 in setting of COVID-19 PNA   Gout    Hypertension     Medications:  Medications Prior to Admission  Medication Sig Dispense Refill Last Dose   acetaminophen (TYLENOL) 500 MG tablet Take 1,000 mg by mouth every 6 (six) hours as needed for moderate pain.   Past Week   albuterol (VENTOLIN HFA) 108 (90 Base) MCG/ACT inhaler Inhale 2 puffs into the lungs every 4 (four) hours as needed for wheezing or shortness of breath.   unknown   allopurinol (ZYLOPRIM) 100 MG tablet Take 100 mg by mouth 2 (two) times daily.   Past Week   apixaban (ELIQUIS) 2.5 MG  TABS tablet Take 1 tablet (2.5 mg total) by mouth 2 (two) times daily. 60 tablet 11 06/11/2022 at 7:00pm   cyclobenzaprine (FLEXERIL) 5 MG tablet Take 2.5 mg by mouth daily as needed for muscle spasms.   unknown   fluticasone-salmeterol (ADVAIR) 250-50 MCG/ACT AEPB Inhale 1 puff into the lungs 2 (two) times daily.   Past Week   hydrALAZINE (APRESOLINE) 50 MG tablet Take 1 tablet (50 mg total) by mouth 3 (three) times daily. 270 tablet 3 Past Week   Lidocaine 4 % PTCH Apply 1 patch topically daily as needed (pain).   unknown   metoprolol tartrate (LOPRESSOR) 50 MG tablet Take 1 tablet (50 mg total) by mouth 2 (two) times daily.   06/11/2022 at 7:00pm   ondansetron (ZOFRAN-ODT) 4 MG disintegrating tablet Take 1 tablet (4 mg total) by mouth every 8 (eight) hours as needed for nausea or  vomiting. 20 tablet 0 unknown   oxyCODONE (ROXICODONE) 5 MG immediate release tablet Take 1 tablet (5 mg total) by mouth every 6 (six) hours as needed for severe pain. 20 tablet 0 unknown   pantoprazole (PROTONIX) 40 MG tablet Take 1 tablet (40 mg total) by mouth 2 (two) times daily. 60 tablet 0 Past Week   verapamil (CALAN) 120 MG tablet Take 120 mg by mouth 3 (three) times daily. Take one tablet three times a day for 90 days.   Past Week   sevelamer carbonate (RENVELA) 800 MG tablet Take 2 tablets (1,600 mg total) by mouth 3 (three) times daily with meals. (Patient not taking: Reported on 06/13/2022)   Not Taking    [name] with history of atrial fibrillation on Eliquis prior to admission. Eliquis held for proceure. HD catheter removal 7/10. Last eliquis given on 7/9 0930AM.  Will monitor heparin using aPTTs due to apixaban's effect on elevated heparin levels,  until heparin level and aPTT correlate.   Hgb stable at 10.9, pltc 155.  No bleeding noted.   IV heparin 800 units/hr started 0730 today. Baseline heparin level and aPTT obtained.    Goal of Therapy:  Heparin level 0.3-0.7 units/ml aPTT 66-102  seconds Monitor platelets by anticoagulation protocol: Yes   Plan:  Continue IV Heparin infusion 800 units/hr (no bolus) F/u 8 hr aPTT (secondary to PTA apix) Daily aPTT, HL , CBC F/u eliquis for restart.    Francena Hanly, Pharm.D. [place] PGY1 Pharmacy Resident   06/15/2022 9:44 AM

## 2022-06-15 NOTE — Progress Notes (Signed)
Vancomycin delivered to 5W. RN delivered medication to hemodiaylsis unit and gave to RN in care of pt.

## 2022-06-16 DIAGNOSIS — K828 Other specified diseases of gallbladder: Secondary | ICD-10-CM | POA: Diagnosis not present

## 2022-06-16 DIAGNOSIS — N186 End stage renal disease: Secondary | ICD-10-CM | POA: Diagnosis not present

## 2022-06-16 DIAGNOSIS — G9341 Metabolic encephalopathy: Secondary | ICD-10-CM | POA: Diagnosis not present

## 2022-06-16 DIAGNOSIS — R7881 Bacteremia: Secondary | ICD-10-CM | POA: Diagnosis not present

## 2022-06-16 DIAGNOSIS — L8990 Pressure ulcer of unspecified site, unspecified stage: Secondary | ICD-10-CM | POA: Insufficient documentation

## 2022-06-16 LAB — AFP TUMOR MARKER: AFP, Serum, Tumor Marker: 2.7 ng/mL (ref 0.0–9.2)

## 2022-06-16 LAB — HEPARIN LEVEL (UNFRACTIONATED): Heparin Unfractionated: 0.62 IU/mL (ref 0.30–0.70)

## 2022-06-16 LAB — APTT: aPTT: 38 seconds — ABNORMAL HIGH (ref 24–36)

## 2022-06-16 LAB — CBC
HCT: 29.8 % — ABNORMAL LOW (ref 36.0–46.0)
Hemoglobin: 10.1 g/dL — ABNORMAL LOW (ref 12.0–15.0)
MCH: 32.6 pg (ref 26.0–34.0)
MCHC: 33.9 g/dL (ref 30.0–36.0)
MCV: 96.1 fL (ref 80.0–100.0)
Platelets: 175 10*3/uL (ref 150–400)
RBC: 3.1 MIL/uL — ABNORMAL LOW (ref 3.87–5.11)
RDW: 18.5 % — ABNORMAL HIGH (ref 11.5–15.5)
WBC: 5.3 10*3/uL (ref 4.0–10.5)
nRBC: 0 % (ref 0.0–0.2)

## 2022-06-16 LAB — CANCER ANTIGEN 19-9: CA 19-9: <2 U/mL (ref 0–35)

## 2022-06-16 MED ORDER — HEPARIN BOLUS VIA INFUSION
2000.0000 [IU] | Freq: Once | INTRAVENOUS | Status: AC
Start: 2022-06-16 — End: 2022-06-16
  Administered 2022-06-16: 2000 [IU] via INTRAVENOUS
  Filled 2022-06-16: qty 2000

## 2022-06-16 MED ORDER — APIXABAN 5 MG PO TABS: 5.0000 mg | ORAL_TABLET | Freq: Two times a day (BID) | ORAL | Status: DC

## 2022-06-16 MED ORDER — APIXABAN 2.5 MG PO TABS
2.5000 mg | ORAL_TABLET | Freq: Two times a day (BID) | ORAL | Status: DC
  Administered 2022-06-16 – 2022-06-21 (×11): 2.5 mg via ORAL
  Filled 2022-06-16 (×11): qty 1

## 2022-06-16 MED ORDER — CHLORHEXIDINE GLUCONATE CLOTH 2 % EX PADS
6.0000 | MEDICATED_PAD | Freq: Every day | CUTANEOUS | Status: DC
  Administered 2022-06-17 – 2022-06-21 (×4): 6 via TOPICAL

## 2022-06-16 NOTE — Progress Notes (Signed)
ANTICOAGULATION CONSULT NOTE Pharmacy Consult for Heparin  Indication:  history of Atrial fibrillation   Allergies  Allergen Reactions   Shellfish Allergy     Unknown reaction      Labs: Recent Labs    06/14/22 0113 06/14/22 0113 06/14/22 1304 06/15/22 0243 06/15/22 0857 06/15/22 2056 06/16/22 0702  HGB 11.6*  --   --  10.9*  --   --  10.1*  HCT 33.3*  --   --  32.2*  --   --  29.8*  PLT 150  --   --  155  --   --  175  APTT  --    < > 35  --  39* 38* 38*  HEPARINUNFRC  --   --  >1.10*  --   --   --  0.62  CREATININE 7.05*  --   --   --   --   --   --    < > = values in this interval not displayed.    [name] with history of atrial fibrillation on Eliquis prior to admission. Eliquis held for proceure. HD catheter removal 7/10. Last eliquis given on 7/9 0930AM this admit,  and no doses given 7/8.  Last taken PTA on 7/7 @ 1900.   PTT 38 , remains low after heparin rate increased to 1000 units/hr. HL 0.62 likely still effected by apixaban but trending down.  No issues / interruptions with heparin infusion or bleeding per RN report.  Hgb 10.1 low/stable, pltc wnl    Goal of Therapy:  Heparin level 0.3-0.7 units/ml aPTT 66-102 seconds Monitor platelets by anticoagulation protocol: Yes   Plan:  Give heparin bolus 2000 units x 1  Increase heparin rate to 1150 units/hr F/u 8 hr aPTT/ HL ( d/t PTA apix) Daily aPTT, HL , CBC F/u eliquis for restart after procedures.   Thank you Nicole Cella, RPh Clinical Pharmacist   06/16/2022 9:30 AM

## 2022-06-16 NOTE — Progress Notes (Addendum)
[facility] for transition from heparin infusion to apixaban Indication:  history of Atrial fibrillation   Allergies  Allergen Reactions   Shellfish Allergy     Unknown reaction      Labs: Recent Labs    06/14/22 0113 06/14/22 0113 06/14/22 1304 06/15/22 0243 06/15/22 0857 06/15/22 2056 06/16/22 0702  HGB 11.6*  --   --  10.9*  --   --  10.1*  HCT 33.3*  --   --  32.2*  --   --  29.8*  PLT 150  --   --  155  --   --  175  APTT  --    < > 35  --  39* 38* 38*  HEPARINUNFRC  --   --  >1.10*  --   --   --  0.62  CREATININE 7.05*  --   --   --   --   --   --    < > = values in this interval not displayed.    [name] with history of atrial fibrillation on Eliquis prior to admission. Eliquis was held for proceure. HD catheter removal 7/10. Last eliquis given on 7/9 0930AM. Received heparin infusion which was changed back to home dose of apixaban      Goal of Therapy:  Monitor platelets by anticoagulation protocol: Yes   Plan:  Discontinue heparin infusion and start apixaban 2.5 mg po bid (returned to home dose) Monitor for signs of bleeding. I will sign off consult but continue to monitor in the background making recommendations prn  Thank you  Vaughan Basta BS, PharmD, BCPS Clinical Pharmacist 06/16/2022 1:52 PM  Contact: [phone] after 3 PM  "Be curious, not judgmental..." -Jamal Maes

## 2022-06-16 NOTE — Progress Notes (Signed)
Brief ID note:  Patient continues on vancomycin dosed for HD.  Repeat blood cultures after dialysis catheter removal drawn yesterday are no growth to date.  She will go for TEE tomorrow and will continue to follow.    Raynelle Highland for Infectious Disease [facility] 06/16/2022, 9:43 AM

## 2022-06-16 NOTE — Progress Notes (Addendum)
PROGRESS NOTE    Diamond [name]  XIP:[id number] DOB: 09-Sep-1951 DOA: 06/12/2022 PCP: [name], MD    Brief Narrative:   71 yo female with the past medical history of ESRD on HD, anemia, hypertension and hx MRSA  L5-S1 discitis and endocarditis presented to hospital with abdominal pain diarrhea nausea vomiting for 2 days.  Patient admits that she has since of hemodialysis..  Initial labs showed sodium of 129 potassium of 5.6 with creatinine of 11.7.  Hemoglobin was in WBC at 8.6.  Chest x-ray without acute findings.  EKG was unremarkable   CT head was unremarkable.  CT scan of the abdomen and pelvis showed cholelithiasis with thickening of the gallbladder suspicious for gallbladder carcinoma.    Blood cultures were 3/4 bottles staphylococcus aureus MRSA to secondary to dialysis catheter.Marland Kitchen She was seen by ID and nephrology and was admitted for further care.  At this time, hemodialysis catheter has been removed.  Repeat blood culture negative in 1 day.  ID is following the patient.  Plan for TEE 06/17/2022.  Patient has been seen by general surgery for gallbladder findings.  Assessment and Plan:  Principal Problem:   MRSA bacteremia Active Problems:   Gallbladder mass   Hypertension   ESRD on dialysis ([place])   Paroxysmal atrial flutter (HCC)   Acute metabolic encephalopathy   Pressure injury of skin   MRSA bacteremia.  Thought to be secondary to infected hemodialysis catheter.  Hemodialysis catheter has been removed 06/14/2022.  Catheter tip culture with Staph aureus.  Has left arm AV fistula which is functional.  TEE on 06/17/2022.  ID on board for antibiotic management.  Currently on IV vancomycin.  Repeat blood culture from 06/15/2022 negative in less than 24 hours.   Known history of gallbladder mass -has not followed up with general surgery as outpatient.  General surgery has been consulted at this time.  Might need surgical removal.  Hypertension -continue metoprolol.  Blood  pressure now at 120/65   ESRD on dialysis [facility]) - on Monday Wednesday Friday schedule.  Missed 2 hemodialysis sessions.  Nephrology on board for hemodialysis needs  Hyperkalemia on presentation.  Has improved and hemodialysis as indicated.   Anemia of chronic renal disease, hemo-globin of 10.1.  We will continue to monitor.  Paroxysmal atrial flutter (HCC) -was in RVR.  Cardiology was consulted.  On Cardizem drip and oral Cardizem every 8 hourly with heparin drip.  Eliquis currently on hold.  Communicated with general surgery this morning who indicated possible surgical intervention later on so was noted to initiate Eliquis if needed.  .Acute metabolic encephalopathy  -likely secondary to sepsis.  Improved.  Pressure ulcer stage II at the coccyx.  Present on admission.  Continue wound care. Pressure Injury Coccyx Stage 2 -  Partial thickness loss of dermis presenting as a shallow open injury with a red, pink wound bed without slough. (Active)     Location: Coccyx  Location Orientation:   Staging: Stage 2 -  Partial thickness loss of dermis presenting as a shallow open injury with a red, pink wound bed without slough.  Wound Description (Comments):   Present on Admission: Yes        DVT prophylaxis:   Heparin drip-we will change to Eliquis until surgical recommendation has been made...   Code Status:     Code Status: Full Code  Disposition: Uncertain at this time  Status is: Inpatient  Remains inpatient appropriate because: IV antibiotic, MRSA bacteremia possible need for gall  bladder surgery, need for TEE 06/17/2022   Family Communication: None at bedside  Consultants:  General surgery Infectious disease Cardiology Vascular surgery  Procedures:  Removal of right subclavian hemodialysis catheter on 06/14/2022   Antimicrobials:  Vancomycin IV  Anti-infectives (From admission, onward)    Start     Dose/Rate Route Frequency Ordered Stop   06/15/22 1200  vancomycin  (VANCOREADY) IVPB 500 mg/100 mL        500 mg 100 mL/hr over 60 Minutes Intravenous Every T-Th-Sa (Hemodialysis) 06/15/22 1028     06/14/22 1200  vancomycin (VANCOREADY) IVPB 500 mg/100 mL        500 mg 100 mL/hr over 60 Minutes Intravenous Every M-W-F (Hemodialysis) 06/14/22 0814 06/14/22 1714   06/13/22 1130  cefTRIAXone (ROCEPHIN) 2 g in sodium chloride 0.9 % 100 mL IVPB  Status:  Discontinued        2 g 200 mL/hr over 30 Minutes Intravenous Every 24 hours 06/13/22 1118 06/13/22 1249   06/13/22 0800  vancomycin (VANCOREADY) IVPB 1250 mg/250 mL        1,250 mg 166.7 mL/hr over 90 Minutes Intravenous  Once 06/13/22 0757 06/13/22 1223   06/13/22 0757  vancomycin variable dose per unstable renal function (pharmacist dosing)  Status:  Discontinued         Does not apply See admin instructions 06/13/22 0757 06/15/22 1029      Subjective: Today, patient was seen and examined at bedside.  Patient states she is okay.  Denies any nausea vomiting shortness of breath but has some back pain from the time she had hurt herself in the past.  Objective: Vitals:   06/16/22 0439 06/16/22 0500 06/16/22 0820 06/16/22 1157  BP: 104/69  118/77 120/65  Pulse: 100  96 82  Resp: '17  18 20  '$ Temp: 98.7 F (37.1 C)  98.7 F (37.1 C) 99.2 F (37.3 C)  TempSrc: Oral  Oral Oral  SpO2: 97%  97% 100%  Weight:  63.2 kg    Height:        Intake/Output Summary (Last 24 hours) at 06/16/2022 1312 Last data filed at 06/16/2022 1034 Gross per 24 hour  Intake 1016.35 ml  Output 0 ml  Net 1016.35 ml   Filed Weights   06/14/22 1159 06/15/22 1500 06/16/22 0500  Weight: 57.3 kg 56.8 kg 63.2 kg    Physical Examination: Body mass index is 21.82 kg/m.   General:  Average built, not in obvious distress alert awake and communicative HENT:   No scleral pallor or icterus noted. Oral mucosa is moist.  Chest:  Clear breath sounds.  Diminished breath sounds bilaterally. No crackles or wheezes.  Status post right  chest wall catheter removal. CVS: S1 &S2 heard. No murmur.  Regular rate and rhythm. Abdomen: Soft, nontender, nondistended.  Bowel sounds are heard.   Extremities: No cyanosis, clubbing or edema.  Peripheral pulses are palpable.  Left AV fistula. Psych: Alert, awake , impaired memory, CNS:  No cranial nerve deficits.  Power equal in all extremities.   Skin: Warm and dry.  No rashes noted.  Data Reviewed:   CBC: Recent Labs  Lab 06/12/22 1545 06/12/22 1649 06/13/22 0444 06/14/22 0113 06/15/22 0243 06/16/22 0702  WBC 8.6  --  7.5 11.9* 7.4 5.3  NEUTROABS 7.6  --  6.3 10.2* 5.9  --   HGB 12.7 13.9  14.3 11.2* 11.6* 10.9* 10.1*  HCT 37.5 41.0  42.0 34.7* 33.3* 32.2* 29.8*  MCV 95.9  --  98.6 94.3 95.3 96.1  PLT 157  --  136* 150 155 970    Basic Metabolic Panel: Recent Labs  Lab 06/12/22 1545 06/12/22 1649 06/13/22 0444 06/14/22 0113  NA 129* 127*  128* 129* 135  K 5.6* 5.5*  5.4* 5.0 3.9  CL 94* 99 97* 92*  CO2 16*  --  14* 18*  GLUCOSE 90 83 70 63*  BUN 91* 80* 94* 43*  CREATININE 11.73* 13.60* 12.08* 7.05*  CALCIUM 9.3  --  9.0 9.0  PHOS  --   --  8.9*  --     Liver Function Tests: Recent Labs  Lab 06/12/22 1545 06/13/22 0444  AST 16  --   ALT 10  --   ALKPHOS 56  --   BILITOT 1.1  --   PROT 7.0  --   ALBUMIN 3.0* 2.6*     Radiology Studies: CT CHEST WO CONTRAST  Result Date: 06/15/2022 CLINICAL DATA:  Gallbladder/biliary cancer. EXAM: CT CHEST WITHOUT CONTRAST TECHNIQUE: Multidetector CT imaging of the chest was performed following the standard protocol without IV contrast. RADIATION DOSE REDUCTION: This exam was performed according to the departmental dose-optimization program which includes automated exposure control, adjustment of the mA and/or kV according to patient size and/or use of iterative reconstruction technique. COMPARISON:  CT chest without contrast 12/22/2021. CT chest with contrast 10/15/2010 FINDINGS: Cardiovascular: Heart size is  normal. Coronary artery calcifications are present. Atherosclerotic changes are again noted in the aorta. No significant interval change is present. The ascending thoracic aorta is stable at 40 mm. Descending thoracic aorta demonstrates mural calcifications without aneurysmal dilation. Mediastinum/Nodes: No significant mediastinal, hilar, or axillary adenopathy is present. Detail is somewhat limited without IV contrast. Lungs/Pleura: The elongated nodular density in the right lower lobe is stable measuring 8 x 7 x 14 mm. Punctate hyperdensity suggest peripheral calcification. Measurements are unchanged. A 6 x 5 mm left lower lobe pulmonary nodule on image 81 of series 4 is stable. The nodules appear similar on axial images. No new nodules are present. No significant pleural disease is present. Scarring scratched at upper pole scarring in the left upper lobe is stable. Upper Abdomen: Diffuse gallbladder wall thickening is noted. The gallbladder is partially imaged. Upper abdomen is otherwise unremarkable. Musculoskeletal: No chest wall mass or suspicious bone lesions identified. IMPRESSION: 1. Stable elongated nodular density in the right lower lobe. This is most likely benign. This is now stable at 6 months. CT at 12-18 months (from today's scan) is considered optional for low-risk patients, but is recommended for high-risk patients. This recommendation follows the consensus statement: Guidelines for Management of Incidental Pulmonary Nodules Detected on CT Images: From the Fleischner Society 2017; Radiology 2017; 284:228-243. 2. Stable 6 mm left lower lobe pulmonary nodule. No follow-up recommended for this nodule. 3. Coronary artery disease. 4. Stable diameter of the ascending thoracic aorta at 40 mm. 5. Diffuse gallbladder wall thickening is partially imaged. Refer to prior CT of the abdomen and pelvis. 6. Aortic Atherosclerosis (ICD10-I70.0). Electronically Signed   By: San Morelle M.D.   On: 06/15/2022  13:07      LOS: 4 days    Flora Lipps, MD [facility] Available via Epic secure chat 7am-7pm After these hours, please refer to coverage provider listed on [email or web address] 06/16/2022, 1:12 PM

## 2022-06-16 NOTE — Progress Notes (Signed)
[facility] Progress Note  Subjective: seen in room, seems more alert today. TDC pulled last night.   Vitals:   06/16/22 0439 06/16/22 0500 06/16/22 0820 06/16/22 1157  BP: 104/69  118/77 120/65  Pulse: 100  96 82  Resp: '17  18 20  '$ Temp: 98.7 F (37.1 C)  98.7 F (37.1 C) 99.2 F (37.3 C)  TempSrc: Oral  Oral Oral  SpO2: 97%  97% 100%  Weight:  63.2 kg    Height:        Exam: General: Appears comfortable, in no distress  Head: NCAT sclera not icteric MMM Neck: Supple. No JVD appreciated  Lungs: Clear bilaterally without wheezes, rales, or rhonchi. Normal WOB  Heart: RRR, no murmur, rub, or gallop  Abdomen: soft non-tender, no mass or s Lower extremities: w/o edema Neuro: A & O X 2 Follows commands  Psych: alert, nonfocal, sluggish Dialysis Access: R IJ TDC in place; LUE AVF +bruit     OP HD: [place] TTS 4h  60.3kg  400/600  2/2 bath  AVF < using (RIJ TDC now removed) Hep none - mircera 150 q2, last 6/24 - hectorol 4 ug iv tts   Assessment/ Plan: MRSA Bacteremia - H/o MRSA bacteremia/endocarditis. IV Vancomycin per primary. For TEE. TDC removed on 7/10 by VVS. Using AVF now.  AMS - No acute findings on head CT. Possibly infection related. Seems better.  Abdominal pain/Gallbladder mass -  W/u per primary team  ESRD -  HD TTS. Last dialysis on 7/1. Missed dialysis last week d/t #3. Had HD again using the AVF yesterday w/o issues. Next HD tomorrow.  Hypertension/volume  - BPs on the lower side of normal, no sig vol overload. Was under dry wt. Keep even next HD.  Anemia  - Hgb 11.2. No ESA needs.  Metabolic bone disease -  Phos above goal. Continue Renvela binders.  Hx atrial flutter     Rob Celia Friedland 06/16/2022, 3:31 PM   Recent Labs  Lab 06/12/22 1545 06/12/22 1649 06/13/22 0444 06/14/22 0113 06/15/22 0243 06/16/22 0702  HGB 12.7   < > 11.2* 11.6* 10.9* 10.1*  ALBUMIN 3.0*  --  2.6*  --   --   --   CALCIUM 9.3  --  9.0 9.0  --   --   PHOS  --    --  8.9*  --   --   --   CREATININE 11.73*   < > 12.08* 7.05*  --   --   K 5.6*   < > 5.0 3.9  --   --    < > = values in this interval not displayed.    No results for input(s): "IRON", "TIBC", "FERRITIN" in the last 168 hours. Inpatient medications:  apixaban  2.5 mg Oral BID   Chlorhexidine Gluconate Cloth  6 each Topical Q0600   Chlorhexidine Gluconate Cloth  6 each Topical Q0600   diltiazem  60 mg Oral Q8H   pantoprazole  40 mg Oral BID   sevelamer carbonate  1,600 mg Oral TID WC    diltiazem (CARDIZEM) infusion Stopped (06/16/22 1145)   vancomycin Stopped (06/15/22 1832)   acetaminophen, hydrALAZINE, HYDROmorphone (DILAUDID) injection, lidocaine HCl (PF), oxyCODONE, prochlorperazine

## 2022-06-17 ENCOUNTER — Encounter (HOSPITAL_COMMUNITY)
Admission: EM | Disposition: A | Discharge status: Skilled Nursing Facility | Payer: Self-pay | Source: Home / Self Care | Attending: Family Medicine

## 2022-06-17 ENCOUNTER — Encounter (HOSPITAL_COMMUNITY): Payer: Self-pay | Admitting: Internal Medicine

## 2022-06-17 ENCOUNTER — Inpatient Hospital Stay (HOSPITAL_COMMUNITY): Payer: Medicare HMO

## 2022-06-17 ENCOUNTER — Inpatient Hospital Stay (HOSPITAL_COMMUNITY): Payer: Medicare HMO | Admitting: Anesthesiology

## 2022-06-17 DIAGNOSIS — K828 Other specified diseases of gallbladder: Secondary | ICD-10-CM | POA: Diagnosis not present

## 2022-06-17 DIAGNOSIS — D631 Anemia in chronic kidney disease: Secondary | ICD-10-CM

## 2022-06-17 DIAGNOSIS — N186 End stage renal disease: Secondary | ICD-10-CM | POA: Diagnosis not present

## 2022-06-17 DIAGNOSIS — I10 Essential (primary) hypertension: Secondary | ICD-10-CM

## 2022-06-17 DIAGNOSIS — I4892 Unspecified atrial flutter: Secondary | ICD-10-CM

## 2022-06-17 DIAGNOSIS — I081 Rheumatic disorders of both mitral and tricuspid valves: Secondary | ICD-10-CM | POA: Diagnosis not present

## 2022-06-17 DIAGNOSIS — I330 Acute and subacute infective endocarditis: Secondary | ICD-10-CM | POA: Diagnosis not present

## 2022-06-17 DIAGNOSIS — Z992 Dependence on renal dialysis: Secondary | ICD-10-CM

## 2022-06-17 DIAGNOSIS — B9562 Methicillin resistant Staphylococcus aureus infection as the cause of diseases classified elsewhere: Secondary | ICD-10-CM | POA: Diagnosis not present

## 2022-06-17 DIAGNOSIS — G9341 Metabolic encephalopathy: Secondary | ICD-10-CM | POA: Diagnosis not present

## 2022-06-17 DIAGNOSIS — R7881 Bacteremia: Secondary | ICD-10-CM

## 2022-06-17 DIAGNOSIS — I120 Hypertensive chronic kidney disease with stage 5 chronic kidney disease or end stage renal disease: Secondary | ICD-10-CM | POA: Diagnosis not present

## 2022-06-17 HISTORY — PX: TEE WITHOUT CARDIOVERSION: SHX5443

## 2022-06-17 LAB — CATH TIP CULTURE: Culture: 20000 — AB

## 2022-06-17 LAB — BASIC METABOLIC PANEL
Anion gap: 13 (ref 5–15)
BUN: 29 mg/dL — ABNORMAL HIGH (ref 8–23)
CO2: 25 mmol/L (ref 22–32)
Calcium: 8.9 mg/dL (ref 8.9–10.3)
Chloride: 91 mmol/L — ABNORMAL LOW (ref 98–111)
Creatinine, Ser: 5.48 mg/dL — ABNORMAL HIGH (ref 0.44–1.00)
GFR, Estimated: 8 mL/min — ABNORMAL LOW (ref >60)
Glucose, Bld: 104 mg/dL — ABNORMAL HIGH (ref 70–99)
Potassium: 3.8 mmol/L (ref 3.5–5.1)
Sodium: 129 mmol/L — ABNORMAL LOW (ref 135–145)

## 2022-06-17 LAB — MAGNESIUM: Magnesium: 1.5 mg/dL — ABNORMAL LOW (ref 1.7–2.4)

## 2022-06-17 LAB — CBC
HCT: 28.9 % — ABNORMAL LOW (ref 36.0–46.0)
Hemoglobin: 10.1 g/dL — ABNORMAL LOW (ref 12.0–15.0)
MCH: 32.5 pg (ref 26.0–34.0)
MCHC: 34.9 g/dL (ref 30.0–36.0)
MCV: 92.9 fL (ref 80.0–100.0)
Platelets: 196 10*3/uL (ref 150–400)
RBC: 3.11 MIL/uL — ABNORMAL LOW (ref 3.87–5.11)
RDW: 18.6 % — ABNORMAL HIGH (ref 11.5–15.5)
WBC: 6 10*3/uL (ref 4.0–10.5)
nRBC: 0 % (ref 0.0–0.2)

## 2022-06-17 SURGERY — ECHOCARDIOGRAM, TRANSESOPHAGEAL
Anesthesia: General

## 2022-06-17 MED ORDER — SODIUM CHLORIDE 0.9 % IV SOLN: INTRAVENOUS | Status: DC

## 2022-06-17 MED ORDER — PROPOFOL 10 MG/ML IV BOLUS
INTRAVENOUS | Status: DC | PRN
  Administered 2022-06-17: 20 mg via INTRAVENOUS

## 2022-06-17 MED ORDER — PROPOFOL 500 MG/50ML IV EMUL
INTRAVENOUS | Status: DC | PRN
  Administered 2022-06-17: 100 ug/kg/min via INTRAVENOUS

## 2022-06-17 MED ORDER — LIDOCAINE HCL (CARDIAC) PF 100 MG/5ML IV SOSY
PREFILLED_SYRINGE | INTRAVENOUS | Status: DC | PRN
  Administered 2022-06-17: 80 mg via INTRAVENOUS

## 2022-06-17 NOTE — Progress Notes (Signed)
Primary Cardiologist:  Johney Frame  Subjective:  Denies SSCP, palpitations or Dyspnea  Objective:  Vitals:   06/17/22 0118 06/17/22 0321 06/17/22 0350 06/17/22 0800  BP:  (!) 145/85  130/76  Pulse: (!) 111 89 98 (!) 108  Resp: '20 19  17  '$ Temp:  97.8 F (36.6 C)  98.6 F (37 C)  TempSrc:  Oral  Oral  SpO2: 98% 95%  99%  Weight: 60.5 kg     Height:        Intake/Output from previous day:  Intake/Output Summary (Last 24 hours) at 06/17/2022 0845 Last data filed at 06/16/2022 1430 Gross per 24 hour  Intake 1277.99 ml  Output 200 ml  Net 1077.99 ml    Physical Exam: Chronically ill black female Right tunneled dialysis catheter removed 06/14/22  LUE fistula with thrill SEM Lungs clear Abdomen benign No edema   Lab Results: Basic Metabolic Panel: Recent Labs    06/17/22 0107  NA 129*  K 3.8  CL 91*  CO2 25  GLUCOSE 104*  BUN 29*  CREATININE 5.48*  CALCIUM 8.9  MG 1.5*   Liver Function Tests: No results for input(s): "AST", "ALT", "ALKPHOS", "BILITOT", "PROT", "ALBUMIN" in the last 72 hours.  No results for input(s): "LIPASE", "AMYLASE" in the last 72 hours.  CBC: Recent Labs    06/15/22 0243 06/16/22 0702 06/17/22 0107  WBC 7.4 5.3 6.0  NEUTROABS 5.9  --   --   HGB 10.9* 10.1* 10.1*  HCT 32.2* 29.8* 28.9*  MCV 95.3 96.1 92.9  PLT 155 175 196    Thyroid Function Tests: Recent Labs    06/14/22 1304  TSH 5.093*   Anemia Panel: No results for input(s): "VITAMINB12", "FOLATE", "FERRITIN", "TIBC", "IRON", "RETICCTPCT" in the last 72 hours.  Imaging: CT CHEST WO CONTRAST  Result Date: 06/15/2022 CLINICAL DATA:  Gallbladder/biliary cancer. EXAM: CT CHEST WITHOUT CONTRAST TECHNIQUE: Multidetector CT imaging of the chest was performed following the standard protocol without IV contrast. RADIATION DOSE REDUCTION: This exam was performed according to the departmental dose-optimization program which includes automated exposure control, adjustment of  the mA and/or kV according to patient size and/or use of iterative reconstruction technique. COMPARISON:  CT chest without contrast 12/22/2021. CT chest with contrast 10/15/2010 FINDINGS: Cardiovascular: Heart size is normal. Coronary artery calcifications are present. Atherosclerotic changes are again noted in the aorta. No significant interval change is present. The ascending thoracic aorta is stable at 40 mm. Descending thoracic aorta demonstrates mural calcifications without aneurysmal dilation. Mediastinum/Nodes: No significant mediastinal, hilar, or axillary adenopathy is present. Detail is somewhat limited without IV contrast. Lungs/Pleura: The elongated nodular density in the right lower lobe is stable measuring 8 x 7 x 14 mm. Punctate hyperdensity suggest peripheral calcification. Measurements are unchanged. A 6 x 5 mm left lower lobe pulmonary nodule on image 81 of series 4 is stable. The nodules appear similar on axial images. No new nodules are present. No significant pleural disease is present. Scarring scratched at upper pole scarring in the left upper lobe is stable. Upper Abdomen: Diffuse gallbladder wall thickening is noted. The gallbladder is partially imaged. Upper abdomen is otherwise unremarkable. Musculoskeletal: No chest wall mass or suspicious bone lesions identified. IMPRESSION: 1. Stable elongated nodular density in the right lower lobe. This is most likely benign. This is now stable at 6 months. CT at 12-18 months (from today's scan) is considered optional for low-risk patients, but is recommended for high-risk patients. This recommendation follows the consensus statement:  Guidelines for Management of Incidental Pulmonary Nodules Detected on CT Images: From the Fleischner Society 2017; Radiology 2017; 284:228-243. 2. Stable 6 mm left lower lobe pulmonary nodule. No follow-up recommended for this nodule. 3. Coronary artery disease. 4. Stable diameter of the ascending thoracic aorta at 40  mm. 5. Diffuse gallbladder wall thickening is partially imaged. Refer to prior CT of the abdomen and pelvis. 6. Aortic Atherosclerosis (ICD10-I70.0). Electronically Signed   By: San Morelle M.D.   On: 06/15/2022 13:07    Cardiac Studies:  ECG: afib nonspecific ST changes    Echo: 03/03/22 IMPRESSIONS     1. Left ventricular ejection fraction, by estimation, is 60 to 65%. The  left ventricle has normal function. The left ventricle has no regional  wall motion abnormalities. Left ventricular diastolic parameters are  consistent with Grade I diastolic  dysfunction (impaired relaxation).   2. Right ventricular systolic function is normal. The right ventricular  size is normal.   3. Highly mobile mass in the right atrium the origin/attachment site is  not well visualized. This appears smaller in size than previously noted  right atrial mass.   4. The mitral valve is normal in structure. Trivial mitral valve  regurgitation. No evidence of mitral stenosis.   5. Tricuspid valve regurgitation is mild to moderate.   6. Highly mobile 0.836 cm x 0.694 cm mass in the RVOT appears to be  attached to the annulus of the aortic valve above the right cusp, present  on previous study. The aortic valve is normal in structure. Aortic valve  regurgitation is not visualized.  Aortic valve sclerosis is present, with no evidence of aortic valve  stenosis.   7. There is dilatation of the ascending aorta, measuring 39 mm.   8. The inferior vena cava is normal in size with greater than 50%  respiratory variability, suggesting right atrial pressure of 3 mmHg.   Comparison(s): No significant change from prior study.   Medications:    apixaban  2.5 mg Oral BID   Chlorhexidine Gluconate Cloth  6 each Topical Q0600   diltiazem  60 mg Oral Q8H   pantoprazole  40 mg Oral BID   sevelamer carbonate  1,600 mg Oral TID WC      diltiazem (CARDIZEM) infusion Stopped (06/16/22 1145)   vancomycin Stopped  (06/15/22 1832)    Assessment/Plan:   SBE/Bacteremia: MRSA with recurrent positive BC;s. Not clear why dialysis catheter not changed in January.Removed 06/14/22 She is getting her dialysis from fistula now Repeat TEE scheduled for today with Dr Harrell Gave he has had TEE before Risks including esophageal injury intubation discussed willing to proceed CRF:  Inpatient dialysis from fistula d/c line ID:  Multiple issues including discitis, GB ? Tumor likely line infection with bacteremia currently on Vanc with ID following Afib:  continue eliquis home dose 2.5 bid on oral cardizem now     Jenkins Rouge 06/17/2022, 8:45 AM

## 2022-06-17 NOTE — Anesthesia Postprocedure Evaluation (Signed)
Anesthesia Post Note  Patient: Diamond [name]  Procedure(s) Performed: TRANSESOPHAGEAL ECHOCARDIOGRAM (TEE)     Patient location during evaluation: PACU Anesthesia Type: General Level of consciousness: awake and alert Pain management: pain level controlled Vital Signs Assessment: post-procedure vital signs reviewed and stable Respiratory status: spontaneous breathing, nonlabored ventilation, respiratory function stable and patient connected to nasal cannula oxygen Cardiovascular status: blood pressure returned to baseline and stable Postop Assessment: no apparent nausea or vomiting Anesthetic complications: no   No notable events documented.  Last Vitals:  Vitals:   06/17/22 1128 06/17/22 1222  BP: 115/77 128/81  Pulse: 84 94  Resp: 13 15  Temp:  37.1 C  SpO2: 97% 97%    Last Pain:  Vitals:   06/17/22 1222  TempSrc: Oral  PainSc: 0-No pain                 Chastin Garlitz

## 2022-06-17 NOTE — NC FL2 (Signed)
[place] LEVEL OF CARE SCREENING TOOL     IDENTIFICATION  Patient Name: Diamond [name] Birthdate: 05-May-1951 Sex: female Admission Date (Current Location): 06/12/2022  [facility] and [place] Number:  Herbalist and Address:  [facility], [place] [address] [place], [place] [address]      Provider Number: [id number]  Attending Physician Name and Address:  Flora Lipps, MD  Relative Name and Phone Number:       Current Level of Care: Hospital Recommended Level of Care: [place] Prior Approval Number:    Date Approved/Denied:   PASRR Number: [id number]  Discharge Plan: SNF    Current Diagnoses: Patient Active Problem List   Diagnosis Date Noted   Pressure injury of skin 06/16/2022   MRSA bacteremia 06/13/2022   ESRD on dialysis [facility])    Paroxysmal atrial flutter (HCC)    Acute metabolic encephalopathy    Acute on chronic anemia with positive fecal occult  02/05/2022   Multiple polyps of sigmoid colon    Heme positive stool    Duodenitis    Candida esophagitis ([place])    Gallbladder mass    Acute blood loss anemia 01/16/2022   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  01/16/2022   Septic discitis of lumbar region 01/16/2022   Unspecified viral hepatitis C without hepatic coma 01/15/2022   Sepsis due to methicillin resistant Staphylococcus aureus ([place]) 12/31/2021   Hypotension 12/27/2021   Bleeding from dialysis catheter site 12/26/2021   Atrial flutter ([place]) 12/25/2021   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 seen on TEE on 1/17 12/24/2021   Lung nodule seen on imaging study 12/23/2021   Vertebral osteomyelitis ([place])    Leukocytosis 12/17/2021   Severe sepsis ([place]) due to MRSA bacteremia, L5-S1 discitis and endocarditis 12/17/2021   Lactic acidosis 12/17/2021   Low back pain 12/17/2021   ESRD on hemodialysis ([place]) 12/17/2021   Mild intermittent asthma 12/17/2021    Encounter for screening for other viral diseases 11/03/2021   Allergy, unspecified, initial encounter 09/14/2021   Coagulation defect, unspecified ([place]) 56/21/3086   Complication of vascular dialysis catheter 09/14/2021   End stage renal disease ([place]) 09/14/2021   Gout due to renal impairment, right ankle and foot 09/14/2021   Iron deficiency anemia, unspecified 09/14/2021   Pain, unspecified 09/14/2021   Pruritus, unspecified 09/14/2021   Secondary hyperparathyroidism of renal origin ([place]) 09/14/2021   Shortness of breath 09/14/2021   Unspecified protein-calorie malnutrition ([place]) 09/14/2021   Vitamin D deficiency 01/02/2021   Hyperparathyroidism ([place]) 12/31/2020   CKD (chronic kidney disease), stage III ([place]) 11/10/2019   Cardiomegaly 11/05/2019   Hyperkalemia 11/05/2019   Hypokalemia 10/18/2019   Pneumonia due to COVID-19 virus 10/17/2019   CAP (community acquired pneumonia) 10/16/2019   Hypertension    Asthma    Gout    Hyponatremia    Multifactorial anemia-acute blood loss from bleeding HD catheter site-superimposed on anemia related to ESRD.     Orientation RESPIRATION BLADDER Height & Weight     Self, Time, Situation, Place  Normal Incontinent Weight: 129 lb 10.1 oz (58.8 kg) Height:  '5\' 7"'$  (170.2 cm)  BEHAVIORAL SYMPTOMS/MOOD NEUROLOGICAL BOWEL NUTRITION STATUS      Continent Diet (See dc summary)  AMBULATORY STATUS COMMUNICATION OF NEEDS Skin   Extensive Assist Verbally PU Stage and Appropriate Care (Stage II on coccyx)  Personal Care Assistance Level of Assistance  Bathing, Feeding, Dressing Bathing Assistance: Limited assistance Feeding assistance: Limited assistance Dressing Assistance: Limited assistance     Functional Limitations Info  Sight Sight Info: Impaired        SPECIAL CARE FACTORS FREQUENCY  PT (By licensed PT), OT (By licensed OT)     PT Frequency: 5x/week OT Frequency: 5x/week            Contractures  Contractures Info: Not present    Additional Factors Info  Code Status, Allergies, Isolation Precautions Code Status Info: Full Allergies Info: Shellfish     Isolation Precautions Info: MRSA     Current Medications (06/17/2022):  This is the current hospital active medication list Current Facility-Administered Medications  Medication Dose Route Frequency Provider Last Rate Last Admin   acetaminophen (TYLENOL) suppository 650 mg  650 mg Rectal Q6H PRN Buford Dresser, MD   650 mg at 06/13/22 2151   apixaban (ELIQUIS) tablet 2.5 mg  2.5 mg Oral BID Buford Dresser, MD   2.5 mg at 06/17/22 8264   Chlorhexidine Gluconate Cloth 2 % PADS 6 each  6 each Topical Q0600 Buford Dresser, MD   6 each at 06/17/22 0452   diltiazem (CARDIZEM) 125 mg in dextrose 5% 125 mL (1 mg/mL) infusion  5-15 mg/hr Intravenous Titrated Buford Dresser, MD   Stopped at 06/16/22 1145   diltiazem (CARDIZEM) tablet 60 mg  60 mg Oral Q8H Buford Dresser, MD   60 mg at 06/17/22 0455   hydrALAZINE (APRESOLINE) injection 10 mg  10 mg Intravenous Q6H PRN Buford Dresser, MD   10 mg at 06/13/22 1400   HYDROmorphone (DILAUDID) injection 0.5 mg  0.5 mg Intravenous Q4H PRN Buford Dresser, MD       lidocaine HCl (PF) (XYLOCAINE) 2 % injection 0-20 mL  0-20 mL Intradermal Once PRN Buford Dresser, MD       oxyCODONE (Oxy IR/ROXICODONE) immediate release tablet 5 mg  5 mg Oral Q6H PRN Buford Dresser, MD   5 mg at 06/17/22 0451   pantoprazole (PROTONIX) EC tablet 40 mg  40 mg Oral BID Buford Dresser, MD   40 mg at 06/17/22 0942   prochlorperazine (COMPAZINE) injection 10 mg  10 mg Intravenous Q6H PRN Buford Dresser, MD       sevelamer carbonate (RENVELA) tablet 1,600 mg  1,600 mg Oral TID WC Buford Dresser, MD   1,600 mg at 06/16/22 1805   vancomycin (VANCOREADY) IVPB 500 mg/100 mL  500 mg Intravenous Q T,Th,Sa-HD Buford Dresser, MD    Stopped at 06/17/22 1653     Discharge Medications: Please see discharge summary for a list of discharge medications.  Relevant Imaging Results:  Relevant Lab Results:   Additional Information SSN [id number]. pizer vaccine 07/12/20, 08/04/20. Receives Diaysis TTS at [facility], Arrives at 11:30 am  for 11:45 chair time  Benard Halsted, LCSW

## 2022-06-17 NOTE — H&P (View-Only) (Signed)
Primary Cardiologist:  Johney Frame  Subjective:  Denies SSCP, palpitations or Dyspnea  Objective:  Vitals:   06/17/22 0118 06/17/22 0321 06/17/22 0350 06/17/22 0800  BP:  (!) 145/85  130/76  Pulse: (!) 111 89 98 (!) 108  Resp: '20 19  17  '$ Temp:  97.8 F (36.6 C)  98.6 F (37 C)  TempSrc:  Oral  Oral  SpO2: 98% 95%  99%  Weight: 60.5 kg     Height:        Intake/Output from previous day:  Intake/Output Summary (Last 24 hours) at 06/17/2022 0845 Last data filed at 06/16/2022 1430 Gross per 24 hour  Intake 1277.99 ml  Output 200 ml  Net 1077.99 ml    Physical Exam: Chronically ill black female Right tunneled dialysis catheter removed 06/14/22  LUE fistula with thrill SEM Lungs clear Abdomen benign No edema   Lab Results: Basic Metabolic Panel: Recent Labs    06/17/22 0107  NA 129*  K 3.8  CL 91*  CO2 25  GLUCOSE 104*  BUN 29*  CREATININE 5.48*  CALCIUM 8.9  MG 1.5*   Liver Function Tests: No results for input(s): "AST", "ALT", "ALKPHOS", "BILITOT", "PROT", "ALBUMIN" in the last 72 hours.  No results for input(s): "LIPASE", "AMYLASE" in the last 72 hours.  CBC: Recent Labs    06/15/22 0243 06/16/22 0702 06/17/22 0107  WBC 7.4 5.3 6.0  NEUTROABS 5.9  --   --   HGB 10.9* 10.1* 10.1*  HCT 32.2* 29.8* 28.9*  MCV 95.3 96.1 92.9  PLT 155 175 196    Thyroid Function Tests: Recent Labs    06/14/22 1304  TSH 5.093*   Anemia Panel: No results for input(s): "VITAMINB12", "FOLATE", "FERRITIN", "TIBC", "IRON", "RETICCTPCT" in the last 72 hours.  Imaging: CT CHEST WO CONTRAST  Result Date: 06/15/2022 CLINICAL DATA:  Gallbladder/biliary cancer. EXAM: CT CHEST WITHOUT CONTRAST TECHNIQUE: Multidetector CT imaging of the chest was performed following the standard protocol without IV contrast. RADIATION DOSE REDUCTION: This exam was performed according to the departmental dose-optimization program which includes automated exposure control, adjustment of  the mA and/or kV according to patient size and/or use of iterative reconstruction technique. COMPARISON:  CT chest without contrast 12/22/2021. CT chest with contrast 10/15/2010 FINDINGS: Cardiovascular: Heart size is normal. Coronary artery calcifications are present. Atherosclerotic changes are again noted in the aorta. No significant interval change is present. The ascending thoracic aorta is stable at 40 mm. Descending thoracic aorta demonstrates mural calcifications without aneurysmal dilation. Mediastinum/Nodes: No significant mediastinal, hilar, or axillary adenopathy is present. Detail is somewhat limited without IV contrast. Lungs/Pleura: The elongated nodular density in the right lower lobe is stable measuring 8 x 7 x 14 mm. Punctate hyperdensity suggest peripheral calcification. Measurements are unchanged. A 6 x 5 mm left lower lobe pulmonary nodule on image 81 of series 4 is stable. The nodules appear similar on axial images. No new nodules are present. No significant pleural disease is present. Scarring scratched at upper pole scarring in the left upper lobe is stable. Upper Abdomen: Diffuse gallbladder wall thickening is noted. The gallbladder is partially imaged. Upper abdomen is otherwise unremarkable. Musculoskeletal: No chest wall mass or suspicious bone lesions identified. IMPRESSION: 1. Stable elongated nodular density in the right lower lobe. This is most likely benign. This is now stable at 6 months. CT at 12-18 months (from today's scan) is considered optional for low-risk patients, but is recommended for high-risk patients. This recommendation follows the consensus statement:  Guidelines for Management of Incidental Pulmonary Nodules Detected on CT Images: From the Fleischner Society 2017; Radiology 2017; 284:228-243. 2. Stable 6 mm left lower lobe pulmonary nodule. No follow-up recommended for this nodule. 3. Coronary artery disease. 4. Stable diameter of the ascending thoracic aorta at 40  mm. 5. Diffuse gallbladder wall thickening is partially imaged. Refer to prior CT of the abdomen and pelvis. 6. Aortic Atherosclerosis (ICD10-I70.0). Electronically Signed   By: San Morelle M.D.   On: 06/15/2022 13:07    Cardiac Studies:  ECG: afib nonspecific ST changes    Echo: 03/03/22 IMPRESSIONS     1. Left ventricular ejection fraction, by estimation, is 60 to 65%. The  left ventricle has normal function. The left ventricle has no regional  wall motion abnormalities. Left ventricular diastolic parameters are  consistent with Grade I diastolic  dysfunction (impaired relaxation).   2. Right ventricular systolic function is normal. The right ventricular  size is normal.   3. Highly mobile mass in the right atrium the origin/attachment site is  not well visualized. This appears smaller in size than previously noted  right atrial mass.   4. The mitral valve is normal in structure. Trivial mitral valve  regurgitation. No evidence of mitral stenosis.   5. Tricuspid valve regurgitation is mild to moderate.   6. Highly mobile 0.836 cm x 0.694 cm mass in the RVOT appears to be  attached to the annulus of the aortic valve above the right cusp, present  on previous study. The aortic valve is normal in structure. Aortic valve  regurgitation is not visualized.  Aortic valve sclerosis is present, with no evidence of aortic valve  stenosis.   7. There is dilatation of the ascending aorta, measuring 39 mm.   8. The inferior vena cava is normal in size with greater than 50%  respiratory variability, suggesting right atrial pressure of 3 mmHg.   Comparison(s): No significant change from prior study.   Medications:    apixaban  2.5 mg Oral BID   Chlorhexidine Gluconate Cloth  6 each Topical Q0600   diltiazem  60 mg Oral Q8H   pantoprazole  40 mg Oral BID   sevelamer carbonate  1,600 mg Oral TID WC      diltiazem (CARDIZEM) infusion Stopped (06/16/22 1145)   vancomycin Stopped  (06/15/22 1832)    Assessment/Plan:   SBE/Bacteremia: MRSA with recurrent positive BC;s. Not clear why dialysis catheter not changed in January.Removed 06/14/22 She is getting her dialysis from fistula now Repeat TEE scheduled for today with Dr Harrell Gave he has had TEE before Risks including esophageal injury intubation discussed willing to proceed CRF:  Inpatient dialysis from fistula d/c line ID:  Multiple issues including discitis, GB ? Tumor likely line infection with bacteremia currently on Vanc with ID following Afib:  continue eliquis home dose 2.5 bid on oral cardizem now     Jenkins Rouge 06/17/2022, 8:45 AM

## 2022-06-17 NOTE — TOC Initial Note (Addendum)
Transition of Care [facility]) - Initial/Assessment Note    Patient Details  Name: Diamond [name] MRN: [id number] Date of Birth: 1951-05-20  [facility] CM/SW Contact:    Benard Halsted, LCSW Phone Number: 06/17/2022, 6:31 PM  Clinical Narrative:                 CSW received consult for possible SNF placement at time of discharge. CSW spoke with patient's son. He reported that patient was living by herself (prior to that live with daughter Diamond [name]). He expressed understanding of PT recommendation and is agreeable to SNF placement at time of discharge if that is what patient needs. CSW discussed insurance authorization process and will provide Medicare SNF ratings list. Patient has received COVID vaccines. Patient uses [facility] for transport to dialysis. CSW will send out referrals for review. Son requested CSW follow up with patient's daughter Diamond [name] for more info.   Skilled Nursing Rehab Facilities-   [email or web address]   Ratings out of 5 possible   Name Address  Phone # [place] Inspection Overall  [facility] [address] [place] '4 5 2 3  '$ [facility]  [address] [place] [phone] '3 2 5 5  '$ [facility] [place], [place] '3 1 1 1  '$ [place], [place] '3 2 4 4  '$ [facility] [address] [place] '1 1 2 1  '$ [place] '2 1 4 3  '$ [facility] [address] [place] '5 2 3 4  '$ [facility] [address] [place] '5 2 2 3  '$ [address] ([place]) [place], [place] [phone] '5 1 2 2  '$ [facility] [phone] [address], [place] [phone] '4 1 2 1  '$ [facility] [address], [facility] [phone] '4 1 2 1  '$ [facility]) [place]. [place], [place]  [phone] '4 1 1 1  '$ [facility] [address] Diamond [name] [phone] '3 2 4 4          '$ [place] '4 2 3 3  '$ [facility] [place] [address] [place] '4 1 5 4  '$ [place], [place] [phone] '2 1 1 1  '$ [facility] [address], [employer] [phone] '2 1 3 2          '$ [address] (no [facility]) [address], [place] [phone] '4 5 5 5  '$ [facility] (No [employer]) [address], [place] '3 1 4 3  '$ [facility] (No [employer]) [place], [place] '5 5 5 5  '$ [facility] [address], [employer] [phone] '3 2 4 4  '$ [place] [address] [place] '1 1 2 1  '$ [place] [address] [place] [phone] '2 1 1 1  '$ [place], [place] '5 2 4 5  '$ [facility] [address] [place] '3 1 1 1  '$ [facility] [place], [place] '2 1 2 1          '$ [facility] [address], [place] '1 1 1 1  '$ [place] [address], [place]  [phone] '2 4 2 2  '$ [facility] [place] [address], [place] [phone] '5 2 3 4  '$ [place] [address] [place] '2 1 1 1  '$ [place] (No Humana) [address] [place] [phone] '2 1 3 2  '$ [facility] [address], [place] [phone] '3 1 1 1          '$ [facility] [place], [place] '5 4 5 5  '$ [facility])  [address], [place] '2 2 3 3  '$ [facility]) [place] [address] Diamond [name] '3 2 4 4  '$ [place] [address], [place] '4 3 4 4  '$ [address] [place], [place] '3 3 1 1  '$ [facility]) [address] [place] [phone] '2 2 4 4     '$ Expected Discharge Plan: [place] Barriers to Discharge: Continued Medical Work up, Ship broker, SNF Pending bed offer   Patient Goals and CMS Choice Patient states their goals for this hospitalization and ongoing recovery are:: Rehab   Choice offered to / list presented to : Adult Children  Expected Discharge Plan and Services Expected Discharge Plan: [place] In-house Referral: Clinical Social Work   Post Acute Care Choice: [place] Living arrangements for the past 2 months: [place]                                      Prior Living Arrangements/Services Living arrangements for the past 2 months: Single Family Home Lives with:: Self Patient language and need for interpreter reviewed:: Yes Do you feel safe going back to the place where you live?: Yes      Need for Family Participation in Patient Care: Yes (Comment) Care giver support system in place?: Yes (comment) Current home services: DME Criminal Activity/Legal Involvement Pertinent to Current Situation/Hospitalization: No - Comment as needed  Activities of Daily Living      Permission Sought/Granted Permission sought to share information with : Facility Sport and exercise psychologist, Family Supports Permission granted to share information with : Yes, Verbal Permission Granted  Share Information with NAME: Marchia Bond  Permission granted to share info w AGENCY: SNFs  Permission granted to share info w Relationship: Son  Permission granted to share info w Contact Information: [phone]  Emotional Assessment Appearance:: Appears stated age   Affect (typically observed): Appropriate Orientation: : Oriented to Self, Oriented to Place, Oriented to  Time, Oriented to Situation Alcohol / Substance Use: Not Applicable Psych Involvement: No (comment)  Admission diagnosis:   Generalized abdominal pain [R10.84] End stage renal disease on dialysis ([place]) [N18.6, Z99.2] Patient Active Problem List   Diagnosis Date Noted   Pressure injury of skin 06/16/2022   MRSA bacteremia 06/13/2022   ESRD on dialysis [facility])    Paroxysmal atrial flutter (HCC)    Acute metabolic encephalopathy    Acute on chronic anemia with positive fecal occult  02/05/2022   Multiple polyps of sigmoid colon    Heme positive stool    Duodenitis    Candida esophagitis (HCC)    Gallbladder mass    Acute blood loss anemia 01/16/2022   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  01/16/2022   Septic discitis of lumbar region 01/16/2022   Unspecified viral hepatitis C without hepatic coma 01/15/2022   Sepsis due to methicillin resistant Staphylococcus  aureus ([place]) 12/31/2021   Hypotension 12/27/2021   Bleeding from dialysis catheter site 12/26/2021   Atrial flutter ([place]) 12/25/2021   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 seen on TEE on 1/17 12/24/2021   Lung nodule seen on imaging study 12/23/2021   Vertebral osteomyelitis ([place])    Leukocytosis 12/17/2021   Severe sepsis ([place]) due to MRSA bacteremia, L5-S1 discitis and endocarditis 12/17/2021   Lactic acidosis 12/17/2021   Low back pain 12/17/2021   ESRD on hemodialysis ([place]) 12/17/2021   Mild intermittent asthma 12/17/2021   Encounter for screening for other viral diseases 11/03/2021   Allergy, unspecified, initial encounter 09/14/2021   Coagulation defect, unspecified ([place]) 40/34/7425   Complication of vascular dialysis catheter 09/14/2021   End stage renal disease ([place]) 09/14/2021   Gout due to renal impairment, right ankle and foot 09/14/2021   Iron deficiency anemia, unspecified 09/14/2021   Pain, unspecified 09/14/2021   Pruritus, unspecified 09/14/2021   Secondary hyperparathyroidism of renal origin ([place]) 09/14/2021   Shortness of breath 09/14/2021   Unspecified protein-calorie malnutrition  ([place]) 09/14/2021   Vitamin D deficiency 01/02/2021   Hyperparathyroidism ([place]) 12/31/2020   CKD (chronic kidney disease), stage III ([place]) 11/10/2019   Cardiomegaly 11/05/2019   Hyperkalemia 11/05/2019   Hypokalemia 10/18/2019   Pneumonia due to COVID-19 virus 10/17/2019   CAP (community acquired pneumonia) 10/16/2019   Hypertension    Asthma    Gout    Hyponatremia    Multifactorial anemia-acute blood loss from bleeding HD catheter site-superimposed on anemia related to ESRD.    PCP:  [name], MD Pharmacy:   [place] [place] - [address] [place] [address] Phone: [phone] Fax: [phone]     Social Determinants of Health (SDOH) Interventions    Readmission Risk Interventions    06/17/2022    1:02 PM 01/22/2022   12:19 PM  Readmission Risk Prevention Plan  Transportation Screening Complete Complete  Medication Review ([place]) Complete Complete  PCP or Specialist appointment within 3-5 days of discharge Complete Complete  HRI or Home Care Consult Complete Complete  SW Recovery Care/Counseling Consult Complete Complete  Palliative Care Screening Not Applicable Not [place] Complete Not Applicable

## 2022-06-17 NOTE — Progress Notes (Signed)
   06/17/22 1825  Assess: MEWS Score  Temp 98.3 F (36.8 C)  BP 124/72  MAP (mmHg) 89  Pulse Rate (!) 127  ECG Heart Rate (!) 129  Resp 12  Level of Consciousness Alert  SpO2 94 %  O2 Device Room Air  Assess: MEWS Score  MEWS Temp 0  MEWS Systolic 0  MEWS Pulse 2  MEWS RR 1  MEWS LOC 0  MEWS Score 3  MEWS Score Color Yellow  Assess: if the MEWS score is Yellow or Red  Were vital signs taken at a resting state? Yes  Focused Assessment No change from prior assessment  Does the patient meet 2 or more of the SIRS criteria? No  MEWS guidelines implemented *See Row Information* Yes  Treat  MEWS Interventions Administered scheduled meds/treatments  Pain Scale 0-10  Pain Score 0  Take Vital Signs  Increase Vital Sign Frequency  Yellow: Q 2hr X 2 then Q 4hr X 2, if remains yellow, continue Q 4hrs  Escalate  MEWS: Escalate Yellow: discuss with charge nurse/RN and consider discussing with provider and RRT  Notify: Charge Nurse/RN  Name of Charge Nurse/RN Notified Erin  Date Charge Nurse/RN Notified 06/17/22  Time Charge Nurse/RN Notified 1839  Document  Patient Outcome Not stable and remains on department  Progress note created (see row info) Yes  Assess: SIRS CRITERIA  SIRS Temperature  0  SIRS Pulse 1  SIRS Respirations  0  SIRS WBC 0  SIRS Score Sum  1

## 2022-06-17 NOTE — Interval H&P Note (Signed)
History and Physical Interval Note:  06/17/2022 10:20 AM  Diamond [name]  has presented today for surgery, with the diagnosis of BACTERIMIA; [place].  The various methods of treatment have been discussed with the patient and family. After consideration of risks, benefits and other options for treatment, the patient has consented to  Procedure(s): TRANSESOPHAGEAL ECHOCARDIOGRAM (TEE) (N/A) as a surgical intervention.  The patient's history has been reviewed, patient examined, no change in status, stable for surgery.  I have reviewed the patient's chart and labs.  Questions were answered to the patient's satisfaction.     Bennet Kujawa Harrell Gave

## 2022-06-17 NOTE — Evaluation (Signed)
Physical Therapy Evaluation Patient Details Name: Diamond [name] MRN: [id number] DOB: [birth year] Today's Date: 06/17/2022  History of Present Illness  Pt is a 71 y/o female admitted secondary to abdominal pain, vomiting and missed HD. Generalized abdominal pain suspect related to gallbladder mass seen on CT scan. Pt also found to have MRSA and acute metabolic encephalopathy. PMH including but not limited to ESRD, HTN and a-flutter. Planning for TEE on 7/13.   Clinical Impression  Pt presented supine in bed with HOB elevated, awake and willing to participate in therapy session. Pt's sister present throughout session as well. Prior to admission, pt reported that she ambulated with use of a cane or RW and was independent with ADLs. Pt lives alone in a single level home with several steps to enter (8). At the time of evaluation, pt significantly limited secondary to lower back pain, fatigue and generalized weakness. She required mod-max A for bed mobility and max A to sit EOB. All VSS throughout with pt on RA. Pt would continue to benefit from skilled physical therapy services at this time while admitted and after d/c to address the below listed limitations in order to improve overall safety and independence with functional mobility.      Recommendations for follow up therapy are one component of a multi-disciplinary discharge planning process, led by the attending physician.  Recommendations may be updated based on patient status, additional functional criteria and insurance authorization.  Follow Up Recommendations Skilled nursing-short term rehab (<3 hours/day) Can patient physically be transported by private vehicle: No    Assistance Recommended at Discharge Frequent or constant Supervision/Assistance  Patient can return home with the following  Two people to help with walking and/or transfers;Two people to help with bathing/dressing/bathroom;Assistance with cooking/housework;Assist for  transportation;Help with stairs or ramp for entrance    Equipment Recommendations None recommended by PT  Recommendations for Other Services       Functional Status Assessment Patient has had a recent decline in their functional status and demonstrates the ability to make significant improvements in function in a reasonable and predictable amount of time.     Precautions / Restrictions Precautions Precautions: Fall Precaution Comments: reported 2 recent falls Restrictions Weight Bearing Restrictions: No      Mobility  Bed Mobility Overal bed mobility: Needs Assistance Bed Mobility: Rolling, Sidelying to Sit, Sit to Sidelying Rolling: Mod assist Sidelying to sit: Max assist     Sit to sidelying: Mod assist General bed mobility comments: increased time and effort, HOB elevated, multimodal cueing for sequencing and technique, assistance needed for trunk management and for movement of bilateral LEs off of and back onto bed    Transfers                   General transfer comment: unable to safely attempt this session    Ambulation/Gait                  Stairs            Wheelchair Mobility    Modified Rankin (Stroke Patients Only)       Balance Overall balance assessment: Needs assistance Sitting-balance support: Feet supported Sitting balance-Leahy Scale: Zero Sitting balance - Comments: pt requiring bilateral UE supports and max A to sit at EOB Postural control: Right lateral lean  Pertinent Vitals/Pain Pain Assessment Pain Assessment: Faces Faces Pain Scale: Hurts even more Pain Location: lower back Pain Descriptors / Indicators: Grimacing, Guarding Pain Intervention(s): Monitored during session, Repositioned    Home Living Family/patient expects to be discharged to:: Private residence Living Arrangements: Alone Available Help at Discharge: Family;Available 24 hours/day Type of Home:  House Home Access: Stairs to enter Entrance Stairs-Rails: Psychiatric nurse of Steps: 8   Home Layout: One level Home Equipment: Cane - single Barista (2 wheels);Rollator (4 wheels)      Prior Function Prior Level of Function : Needs assist             Mobility Comments: walks with a cane or walker depending on the day and how she feels ADLs Comments: reports she is independent     Hand Dominance        Extremity/Trunk Assessment   Upper Extremity Assessment Upper Extremity Assessment: Difficult to assess due to impaired cognition;Generalized weakness    Lower Extremity Assessment Lower Extremity Assessment: Difficult to assess due to impaired cognition;Generalized weakness       Communication   Communication: No difficulties  Cognition Arousal/Alertness: Awake/alert Behavior During Therapy: Flat affect Overall Cognitive Status: Impaired/Different from baseline Area of Impairment: Memory, Following commands, Safety/judgement, Awareness, Problem solving                     Memory: Decreased short-term memory Following Commands: Follows one step commands inconsistently Safety/Judgement: Decreased awareness of deficits, Decreased awareness of safety Awareness: Intellectual Problem Solving: Slow processing, Decreased initiation, Difficulty sequencing, Requires verbal cues, Requires tactile cues          General Comments      Exercises     Assessment/Plan    PT Assessment Patient needs continued PT services  PT Problem List Decreased activity tolerance;Decreased strength;Decreased balance;Decreased mobility;Decreased coordination;Decreased cognition;Decreased knowledge of use of DME;Decreased safety awareness;Decreased knowledge of precautions;Pain       PT Treatment Interventions DME instruction;Gait training;Functional mobility training;Therapeutic activities;Stair training;Balance training;Therapeutic  exercise;Neuromuscular re-education;Patient/family education    PT Goals (Current goals can be found in the Care Plan section)  Acute Rehab PT Goals Patient Stated Goal: to get stronger before returning home PT Goal Formulation: With patient/family Time For Goal Achievement: 07/01/22 Potential to Achieve Goals: Fair    Frequency Min 2X/week     Co-evaluation               AM-PAC PT "6 Clicks" Mobility  Outcome Measure Help needed turning from your back to your side while in a flat bed without using bedrails?: Total Help needed moving from lying on your back to sitting on the side of a flat bed without using bedrails?: Total Help needed moving to and from a bed to a chair (including a wheelchair)?: Total Help needed standing up from a chair using your arms (e.g., wheelchair or bedside chair)?: Total Help needed to walk in hospital room?: Total Help needed climbing 3-5 steps with a railing? : Total 6 Click Score: 6    End of Session   Activity Tolerance: Patient limited by fatigue Patient left: in bed;with call bell/phone within reach;with bed alarm set;with family/visitor present Nurse Communication: Mobility status PT Visit Diagnosis: Other abnormalities of gait and mobility (R26.89);Muscle weakness (generalized) (M62.81)    Time: 1610-9604 PT Time Calculation (min) (ACUTE ONLY): 26 min   Charges:   PT Evaluation $PT Eval Moderate Complexity: 1 Mod PT Treatments $Therapeutic Activity: 8-22 mins  Anastasio Champion, DPT  Acute Rehabilitation Services Office [place] 06/17/2022, 9:41 AM

## 2022-06-17 NOTE — Anesthesia Preprocedure Evaluation (Signed)
Anesthesia Evaluation  Patient identified by MRN, date of birth, ID band Patient awake    Reviewed: Allergy & Precautions, NPO status , Patient's Chart, lab work & pertinent test results  Airway Mallampati: II  TM Distance: >3 FB Neck ROM: Full    Dental  (+) Dental Advisory Given, Missing   Pulmonary asthma ,    Pulmonary exam normal breath sounds clear to auscultation       Cardiovascular hypertension, Pt. on medications + dysrhythmias Atrial Fibrillation  Rhythm:Regular Rate:Tachycardia     Neuro/Psych negative neurological ROS  negative psych ROS   GI/Hepatic negative GI ROS, Neg liver ROS,   Endo/Other  negative endocrine ROS  Renal/GU ESRFRenal disease (TTHSAT)     Musculoskeletal  (+) Arthritis , Osteoarthritis,    Abdominal   Peds  Hematology  (+) Blood dyscrasia, anemia ,   Anesthesia Other Findings   Reproductive/Obstetrics                             Anesthesia Physical  Anesthesia Plan  ASA: 4  Anesthesia Plan: General   Post-op Pain Management: Minimal or no pain anticipated   Induction: Intravenous  PONV Risk Score and Plan: 3 and Treatment may vary due to age or medical condition, Ondansetron, Dexamethasone and Midazolam  Airway Management Planned: Simple Face Mask  Additional Equipment: None  Intra-op Plan:   Post-operative Plan: Extubation in OR  Informed Consent: I have reviewed the patients History and Physical, chart, labs and discussed the procedure including the risks, benefits and alternatives for the proposed anesthesia with the patient or authorized representative who has indicated his/her understanding and acceptance.     Dental advisory given  Plan Discussed with: CRNA and Anesthesiologist  Anesthesia Plan Comments: (HPI: Diamond [name] is a 71 y.o. female with medical history significant for atrial flutter on Eliquis, right atrial mass, ESRD  on HD TTS, anemia of chronic disease, history of bacteremia, history of bacterial endocarditis, essential hypertension, who presented to [facility] ED from home with complaints of generalized abdominal pain. )        Anesthesia Quick Evaluation

## 2022-06-17 NOTE — CV Procedure (Signed)
    TRANSESOPHAGEAL ECHOCARDIOGRAM   NAME:  Diamond [name]   MRN: [id number] DOB:  1951-01-10   ADMIT DATE: 06/12/2022  INDICATIONS: MRSA bacteremia  PROCEDURE:   Informed consent was obtained prior to the procedure. The risks, benefits and alternatives for the procedure were discussed and the patient comprehended these risks.  Risks include, but are not limited to, cough, sore throat, vomiting, nausea, somnolence, esophageal and stomach trauma or perforation, bleeding, low blood pressure, aspiration, pneumonia, infection, trauma to the teeth and death.    Procedural time out performed.   Patient received monitored anesthesia care under the supervision of Dr. Ambrose Pancoast. Patient received a total of 164 mg propofol during the procedure.  The transesophageal probe was inserted in the esophagus and stomach without difficulty and multiple views were obtained.    COMPLICATIONS:    There were no immediate complications.  FINDINGS:  LEFT VENTRICLE: EF = 60-65%. No regional wall motion abnormalities.  RIGHT VENTRICLE: Normal size and function.   LEFT ATRIUM: No thrombus/mass.  LEFT ATRIAL APPENDAGE: No thrombus/mass.   RIGHT ATRIUM: No thrombus/mass seen, interrogated in multiple views/planes.  AORTIC VALVE:  Trileaflet. No regurgitation. No vegetation.  MITRAL VALVE:    Normal structure. Trivial regurgitation. No vegetation.  TRICUSPID VALVE: Normal structure. Mild regurgitation. No vegetation.  PULMONIC VALVE: Grossly normal structure. Trivial regurgitation. No apparent vegetation. Similar very small mobile echodensity seen in RVOT (see image 31), better visualized on prior TTE. Not seen on other views or 3D images.  INTERATRIAL SEPTUM: No PFO or ASD seen by color Doppler.  PERICARDIUM: Trivial effusion noted.  DESCENDING AORTA: Mild diffuse plaque seen   CONCLUSION: No evidence of endocarditis on any of the valves. No mass seen in right atrium. There is a very small  echodensity in the RVOT, seen on prior chest wall imaging. This is not new and is less prominent on current imaging compared to prior.   Buford Dresser, MD, PhD, [facility] at [facility] [address], [place], [place] [address] [phone]   11:02 AM

## 2022-06-17 NOTE — Progress Notes (Signed)
  Echocardiogram Echocardiogram Transesophageal has been performed.  Diamond [name] 06/17/2022, 11:33 AM

## 2022-06-17 NOTE — Progress Notes (Signed)
[facility] Progress Note  Subjective: seen in room, no new c/o's. Awaiting TEE.   Vitals:   06/17/22 0321 06/17/22 0350 06/17/22 0800 06/17/22 1020  BP: (!) 145/85  130/76 133/87  Pulse: 89 98 (!) 108 94  Resp: '19  17 18  '$ Temp: 97.8 F (36.6 C)  98.6 F (37 C) (!) 97.3 F (36.3 C)  TempSrc: Oral  Oral Temporal  SpO2: 95%  99% 100%  Weight:      Height:        Exam:  alert, nad , chron ill appearing, slumped in bed  no jvd  Chest cta bilat  Cor reg no RG  Abd soft ntnd no ascites   Ext no LE edema   Alert, nonfocal, gen weakness      (TDC removed)  +LUE AVF w/ bruit     Home meds: apixaban 2.5 bid, metoprolol 25 bid, pantoprazole, sevelamer carbonate 2 ac tid    OP HD: [place] TTS 4h  60.3kg  400/600  2/2 bath  LUE AVF (TDC removed here) Hep none - mircera 150 q2, last 6/24 - hectorol 4 ug iv tts   Assessment/ Plan: MRSA Bacteremia - H/o MRSA bacteremia/endocarditis. IV Vancomycin per primary. For TEE. TDC removed on 7/10 by VVS. Using AVF now.  AMS - No acute findings on head CT. Possibly infection related. Better.  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 They are recommending removal while here if possible. Per pmd/ gen surg. ESRD -  HD TTS. TDC removed and using AVF here w/o issues. HD today.  Volume  - BPs good, no sig vol overload. At dry wt today, keep even w/ HD.  Anemia  - Hgb 10- 12. No ESA needs.  Metabolic bone disease -  Phos high 8s. Continue Renvela binders.  HTN - on diltiazem, bp's good Hx atrial flutter - getting diltiazem 60 tid here     Rob Asami Lambright 06/17/2022, 10:44 AM   Recent Labs  Lab 06/12/22 1545 06/12/22 1649 06/13/22 0444 06/14/22 0113 06/15/22 0243 06/16/22 0702 06/17/22 0107  HGB 12.7   < > 11.2* 11.6*   < > 10.1* 10.1*  ALBUMIN 3.0*  --  2.6*  --   --   --   --   CALCIUM 9.3  --  9.0 9.0  --   --  8.9  PHOS  --   --  8.9*  --   --   --   --   CREATININE 11.73*   < >  12.08* 7.05*  --   --  5.48*  K 5.6*   < > 5.0 3.9  --   --  3.8   < > = values in this interval not displayed.    No results for input(s): "IRON", "TIBC", "FERRITIN" in the last 168 hours. Inpatient medications:  [MAR Hold] apixaban  2.5 mg Oral BID   [MAR Hold] Chlorhexidine Gluconate Cloth  6 each Topical Q0600   [MAR Hold] diltiazem  60 mg Oral Q8H   [MAR Hold] pantoprazole  40 mg Oral BID   [MAR Hold] sevelamer carbonate  1,600 mg Oral TID WC    sodium chloride     [MAR Hold] diltiazem (CARDIZEM) infusion Stopped (06/16/22 1145)   [MAR Hold] vancomycin Stopped (06/15/22 1832)   [MAR Hold] acetaminophen, [MAR Hold] hydrALAZINE, [MAR Hold]  HYDROmorphone (DILAUDID) injection, [MAR Hold] lidocaine HCl (PF), [MAR Hold] oxyCODONE, [MAR Hold] prochlorperazine

## 2022-06-17 NOTE — Progress Notes (Signed)
CSW left voicemail for patient's son.  Gilmore Laroche, MSW, [facility]

## 2022-06-17 NOTE — Progress Notes (Signed)
PROGRESS NOTE    Diamond [name]  FIE:[id number] DOB: 1951/05/30 DOA: 06/12/2022 PCP: [name], MD    Brief Narrative:   71 yo female with the past medical history of ESRD on HD, anemia, hypertension and hx MRSA  L5-S1 discitis and endocarditis presented to hospital with abdominal pain diarrhea, nausea, vomiting for 2 days.  Initial labs showed sodium of 129 potassium of 5.6 with creatinine of 11.7.  Hemoglobin was in WBC at 8.6.  Chest x-ray without acute findings.  EKG was unremarkable   CT head was unremarkable.  CT scan of the abdomen and pelvis showed cholelithiasis with thickening of the gallbladder suspicious for gallbladder carcinoma.    Blood cultures were 3/4 bottles staphylococcus aureus MRSA to secondary to dialysis catheter.Marland Kitchen She was seen by ID and nephrology and was admitted for further care.  At this time, hemodialysis catheter has been removed.  Repeat blood culture negative in 2 days.  ID is following the patient.  Plan for TEE 06/17/2022.  Patient has been seen by general surgery for gallbladder findings.  Assessment and Plan:  Principal Problem:   MRSA bacteremia Active Problems:   Gallbladder mass   Hypertension   ESRD on dialysis ([place])   Paroxysmal atrial flutter (HCC)   Acute metabolic encephalopathy   Pressure injury of skin   MRSA bacteremia.  Thought to be secondary to infected hemodialysis catheter, status post hemodialysis catheter removal on 06/14/2022.  Catheter tip culture with Staph aureus.  Has left arm AV fistula which is functional.  Plan for TEE on 06/17/2022.  ID on board for antibiotic management.  Currently on IV vancomycin.  Repeat blood culture from 06/15/2022 negative in 2 days.  Follow ID recommendations.   Known history of gallbladder mass -has not followed up with general surgery as outpatient.  General surgery has been consulted at this time.  Plan for surgical removal once the bacteremia improves.  Communicated with general surgery  yesterday  Hypertension -continue metoprolol.  Blood pressure now at 120/65   ESRD on dialysis [facility]) - on Monday Wednesday Friday schedule.  Missed 2 hemodialysis sessions.  Nephrology on board for hemodialysis needs  Hyperkalemia on presentation.  Has improved    Anemia of chronic renal disease, latest hemo-globin of 10.1.  We will continue to monitor.  Paroxysmal atrial flutter (HCC) -was in RVR.  Cardiology was consulted.  Patient was initially on Cardizem drip and was introduced on oral Cardizem every 8 hourly.  Patient has been restarted on Eliquis at this time and was briefly on heparin drip.  Marland KitchenAcute metabolic encephalopathy  -likely secondary to sepsis.  Improved.  Pressure ulcer stage II at the coccyx.  Present on admission.  Continue wound care. Pressure Injury Coccyx Stage 2 -  Partial thickness loss of dermis presenting as a shallow open injury with a red, pink wound bed without slough. (Active)     Location: Coccyx  Location Orientation:   Staging: Stage 2 -  Partial thickness loss of dermis presenting as a shallow open injury with a red, pink wound bed without slough.  Wound Description (Comments):   Present on Admission: Yes      DVT prophylaxis: apixaban (ELIQUIS) tablet 2.5 mg Start: 06/16/22 1445  apixaban (ELIQUIS) tablet 2.5 mg   Code Status:     Code Status: Full Code  Disposition: Uncertain at this time  Status is: Inpatient  Remains inpatient appropriate because: IV antibiotics, MRSA bacteremia, possible need for gall bladder surgery, plan for TEE 06/17/2022  Family Communication:  Tried to reach the patient's son on the phone multiple times but was unable to reach him today.  Patient's friend at bedside.  Consultants:  General surgery Infectious disease Cardiology Vascular surgery  Procedures:  Removal of right subclavian hemodialysis catheter on 06/14/2022   Antimicrobials:  Vancomycin IV 06/13/22>  Anti-infectives (From admission, onward)     Start     Dose/Rate Route Frequency Ordered Stop   06/15/22 1200  vancomycin (VANCOREADY) IVPB 500 mg/100 mL        500 mg 100 mL/hr over 60 Minutes Intravenous Every T-Th-Sa (Hemodialysis) 06/15/22 1028     06/14/22 1200  vancomycin (VANCOREADY) IVPB 500 mg/100 mL        500 mg 100 mL/hr over 60 Minutes Intravenous Every M-W-F (Hemodialysis) 06/14/22 0814 06/14/22 1714   06/13/22 1130  cefTRIAXone (ROCEPHIN) 2 g in sodium chloride 0.9 % 100 mL IVPB  Status:  Discontinued        2 g 200 mL/hr over 30 Minutes Intravenous Every 24 hours 06/13/22 1118 06/13/22 1249   06/13/22 0800  vancomycin (VANCOREADY) IVPB 1250 mg/250 mL        1,250 mg 166.7 mL/hr over 90 Minutes Intravenous  Once 06/13/22 0757 06/13/22 1223   06/13/22 0757  vancomycin variable dose per unstable renal function (pharmacist dosing)  Status:  Discontinued         Does not apply See admin instructions 06/13/22 0757 06/15/22 1029      Subjective: Today, patient was seen and examined at bedside.  Patient denies any pain, nausea, vomiting or fever.  Complains of dry mouth Objective: Vitals:   06/17/22 1108 06/17/22 1118 06/17/22 1128 06/17/22 1222  BP: 109/60 119/75 115/77 128/81  Pulse: 90 87 84 94  Resp: '17 10 13 15  '$ Temp: (!) 97.4 F (36.3 C)   98.8 F (37.1 C)  TempSrc: Temporal   Oral  SpO2: 98% 95% 97% 97%  Weight:      Height:        Intake/Output Summary (Last 24 hours) at 06/17/2022 1315 Last data filed at 06/16/2022 1430 Gross per 24 hour  Intake 261.64 ml  Output 200 ml  Net 61.64 ml    Filed Weights   06/15/22 1500 06/16/22 0500 06/17/22 0118  Weight: 56.8 kg 63.2 kg 60.5 kg    Physical Examination: Body mass index is 20.89 kg/m.   General:  Average built, not in obvious distress, alert awake and communicative HENT:   No scleral pallor or icterus noted. Oral mucosa is moist.  Chest:    Diminished breath sounds bilaterally. No crackles or wheezes.  CVS: S1 &S2 heard. No murmur.  Regular  rate and rhythm. Abdomen: Soft, nontender, nondistended.  Bowel sounds are heard.   Extremities: No cyanosis, clubbing or edema.  Peripheral pulses are palpable.  Left AV fistula in place. Psych: Alert, awake with impaired memory. CNS:  No cranial nerve deficits.  Moves all extremities. Skin: Warm and dry.  No rashes noted.  Data Reviewed:   CBC: Recent Labs  Lab 06/12/22 1545 06/12/22 1649 06/13/22 0444 06/14/22 0113 06/15/22 0243 06/16/22 0702 06/17/22 0107  WBC 8.6  --  7.5 11.9* 7.4 5.3 6.0  NEUTROABS 7.6  --  6.3 10.2* 5.9  --   --   HGB 12.7   < > 11.2* 11.6* 10.9* 10.1* 10.1*  HCT 37.5   < > 34.7* 33.3* 32.2* 29.8* 28.9*  MCV 95.9  --  98.6 94.3 95.3 96.1 92.9  PLT 157  --  136* 150 155 175 196   < > = values in this interval not displayed.     Basic Metabolic Panel: Recent Labs  Lab 06/12/22 1545 06/12/22 1649 06/13/22 0444 06/14/22 0113 06/17/22 0107  NA 129* 127*  128* 129* 135 129*  K 5.6* 5.5*  5.4* 5.0 3.9 3.8  CL 94* 99 97* 92* 91*  CO2 16*  --  14* 18* 25  GLUCOSE 90 83 70 63* 104*  BUN 91* 80* 94* 43* 29*  CREATININE 11.73* 13.60* 12.08* 7.05* 5.48*  CALCIUM 9.3  --  9.0 9.0 8.9  MG  --   --   --   --  1.5*  PHOS  --   --  8.9*  --   --      Liver Function Tests: Recent Labs  Lab 06/12/22 1545 06/13/22 0444  AST 16  --   ALT 10  --   ALKPHOS 56  --   BILITOT 1.1  --   PROT 7.0  --   ALBUMIN 3.0* 2.6*      Radiology Studies: No results found.    LOS: 5 days    Diamond Lipps, MD [facility] Available via Epic secure chat 7am-7pm After these hours, please refer to coverage provider listed on [email or web address] 06/17/2022, 1:15 PM

## 2022-06-17 NOTE — Anesthesia Procedure Notes (Signed)
Procedure Name: MAC Date/Time: 06/17/2022 10:41 AM  Performed by: Lieutenant Diego, CRNAPre-anesthesia Checklist: Patient identified, Emergency Drugs available, Suction available, Patient being monitored and Timeout performed Patient Re-evaluated:Patient Re-evaluated prior to induction Oxygen Delivery Method: Nasal cannula Preoxygenation: Pre-oxygenation with 100% oxygen

## 2022-06-17 NOTE — Progress Notes (Signed)
[facility]  Date of Admission:  06/12/2022           Reason for visit: Follow up on MRSA bacteremia   Current antibiotics: Vancomycin 7/9 -- present   ASSESSMENT:    71 y.o. female admitted with:  MRSA Bacteremia: Patient admitted with positive blood cultures 06/12/2022.  HD catheter removed 7/10 and repeat cultures 7/11 are NGTD.  Patient also has a history of MRSA bacteremia complicated by L3-Y1 vertebral infection and mitral valve thickening/fibrinous density (TTE) with possible infected right atrial thrombus/vegetation (TEE) in January 2023.  Status post 8 weeks of vancomycin with appropriate line holiday at that time (old right IJ TDC removed 12/19/21, repeat cx negative, new right IJ TDC placed 12/23/21).  However, suspect her current bacteremia was due to another line infection that remained in place until removal 7/11. End-stage renal disease: On hemodialysis. Gallbladder mass: Seen by surgery and concerning for adenocarcinoma.   RECOMMENDATIONS:    Continue vancomycin per pharmacy TEE later today Follow repeat blood cultures from 7/11 Lab monitoring Will follow   Principal Problem:   MRSA bacteremia Active Problems:   Hypertension   Gallbladder mass   ESRD on dialysis (HCC)   Paroxysmal atrial flutter (HCC)   Acute metabolic encephalopathy   Pressure injury of skin    MEDICATIONS:    Scheduled Meds:  apixaban  2.5 mg Oral BID   Chlorhexidine Gluconate Cloth  6 each Topical Q0600   diltiazem  60 mg Oral Q8H   pantoprazole  40 mg Oral BID   sevelamer carbonate  1,600 mg Oral TID WC   Continuous Infusions:  diltiazem (CARDIZEM) infusion Stopped (06/16/22 1145)   vancomycin Stopped (06/15/22 1832)   PRN Meds:.acetaminophen, hydrALAZINE, HYDROmorphone (DILAUDID) injection, lidocaine HCl (PF), oxyCODONE, prochlorperazine  SUBJECTIVE:   24 hour events:  No acute events were noted overnight Afebrile, Tmax 99.4 Continued on  vancomycin Planning for TEE today at 1 PM Repeat blood cultures status post catheter removal are no growth No new imaging Seen by surgery regarding gallbladder mass concerning for adenocarcinoma.  Discussions ongoing regarding possible surgery.  No new complaints.  Discussed plan for TEE later today.  She reports that she is feeling better overall.  Review of Systems  All other systems reviewed and are negative.     OBJECTIVE:   Blood pressure (!) 145/85, pulse 98, temperature 97.8 F (36.6 C), temperature source Oral, resp. rate 19, height '5\' 7"'$  (1.702 m), weight 60.5 kg, SpO2 95 %. Body mass index is 20.89 kg/m.  Physical Exam Constitutional:      General: She is not in acute distress.    Appearance: Normal appearance.  HENT:     Head: Normocephalic and atraumatic.  Cardiovascular:     Comments: Right IJ tunneled dialysis catheter has been removed Pulmonary:     Effort: Pulmonary effort is normal. No respiratory distress.  Abdominal:     General: There is no distension.     Palpations: Abdomen is soft.  Musculoskeletal:        General: Normal range of motion.     Cervical back: Normal range of motion and neck supple.  Skin:    General: Skin is warm and dry.     Findings: No rash.  Neurological:     General: No focal deficit present.     Mental Status: She is alert and oriented to person, place, and time.  Psychiatric:        Mood  and Affect: Mood normal.        Behavior: Behavior normal.      Lab Results: Lab Results  Component Value Date   WBC 6.0 06/17/2022   HGB 10.1 (L) 06/17/2022   HCT 28.9 (L) 06/17/2022   MCV 92.9 06/17/2022   PLT 196 06/17/2022    Lab Results  Component Value Date   NA 129 (L) 06/17/2022   K 3.8 06/17/2022   CO2 25 06/17/2022   GLUCOSE 104 (H) 06/17/2022   BUN 29 (H) 06/17/2022   CREATININE 5.48 (H) 06/17/2022   CALCIUM 8.9 06/17/2022   GFRNONAA 8 (L) 06/17/2022   GFRAA 14 (L) 11/12/2019    Lab Results  Component Value  Date   ALT 10 06/12/2022   AST 16 06/12/2022   ALKPHOS 56 06/12/2022   BILITOT 1.1 06/12/2022       Component Value Date/Time   CRP 6.2 (H) 01/21/2022 0228       Component Value Date/Time   ESRSEDRATE 64 (H) 12/17/2021 0630     I have reviewed the micro and lab results in Epic.  Imaging: CT CHEST WO CONTRAST  Result Date: 06/15/2022 CLINICAL DATA:  Gallbladder/biliary cancer. EXAM: CT CHEST WITHOUT CONTRAST TECHNIQUE: Multidetector CT imaging of the chest was performed following the standard protocol without IV contrast. RADIATION DOSE REDUCTION: This exam was performed according to the departmental dose-optimization program which includes automated exposure control, adjustment of the mA and/or kV according to patient size and/or use of iterative reconstruction technique. COMPARISON:  CT chest without contrast 12/22/2021. CT chest with contrast 10/15/2010 FINDINGS: Cardiovascular: Heart size is normal. Coronary artery calcifications are present. Atherosclerotic changes are again noted in the aorta. No significant interval change is present. The ascending thoracic aorta is stable at 40 mm. Descending thoracic aorta demonstrates mural calcifications without aneurysmal dilation. Mediastinum/Nodes: No significant mediastinal, hilar, or axillary adenopathy is present. Detail is somewhat limited without IV contrast. Lungs/Pleura: The elongated nodular density in the right lower lobe is stable measuring 8 x 7 x 14 mm. Punctate hyperdensity suggest peripheral calcification. Measurements are unchanged. A 6 x 5 mm left lower lobe pulmonary nodule on image 81 of series 4 is stable. The nodules appear similar on axial images. No new nodules are present. No significant pleural disease is present. Scarring scratched at upper pole scarring in the left upper lobe is stable. Upper Abdomen: Diffuse gallbladder wall thickening is noted. The gallbladder is partially imaged. Upper abdomen is otherwise unremarkable.  Musculoskeletal: No chest wall mass or suspicious bone lesions identified. IMPRESSION: 1. Stable elongated nodular density in the right lower lobe. This is most likely benign. This is now stable at 6 months. CT at 12-18 months (from today's scan) is considered optional for low-risk patients, but is recommended for high-risk patients. This recommendation follows the consensus statement: Guidelines for Management of Incidental Pulmonary Nodules Detected on CT Images: From the Fleischner Society 2017; Radiology 2017; 284:228-243. 2. Stable 6 mm left lower lobe pulmonary nodule. No follow-up recommended for this nodule. 3. Coronary artery disease. 4. Stable diameter of the ascending thoracic aorta at 40 mm. 5. Diffuse gallbladder wall thickening is partially imaged. Refer to prior CT of the abdomen and pelvis. 6. Aortic Atherosclerosis (ICD10-I70.0). Electronically Signed   By: San Morelle M.D.   On: 06/15/2022 13:07     Imaging independently reviewed in Epic.    Raynelle High[facility] for Infectious Disease [facility] [phone] pager 06/17/2022, 8:05 AM

## 2022-06-17 NOTE — Transfer of Care (Signed)
Immediate Anesthesia Transfer of Care Note  Patient: Diamond [name]  Procedure(s) Performed: TRANSESOPHAGEAL ECHOCARDIOGRAM (TEE)  Patient Location: Endoscopy Unit  Anesthesia Type:MAC  Level of Consciousness: drowsy  Airway & Oxygen Therapy: Patient Spontanous Breathing and Patient connected to nasal cannula oxygen  Post-op Assessment: Report given to RN and Post -op Vital signs reviewed and stable  Post vital signs: Reviewed and stable  Last Vitals:  Vitals Value Taken Time  BP    Temp    Pulse    Resp    SpO2      Last Pain:  Vitals:   06/17/22 1020  TempSrc: Temporal  PainSc: 0-No pain         Complications: No notable events documented.

## 2022-06-18 DIAGNOSIS — K828 Other specified diseases of gallbladder: Secondary | ICD-10-CM | POA: Diagnosis not present

## 2022-06-18 DIAGNOSIS — N186 End stage renal disease: Secondary | ICD-10-CM | POA: Diagnosis not present

## 2022-06-18 DIAGNOSIS — E876 Hypokalemia: Secondary | ICD-10-CM

## 2022-06-18 DIAGNOSIS — I10 Essential (primary) hypertension: Secondary | ICD-10-CM | POA: Diagnosis not present

## 2022-06-18 DIAGNOSIS — G8929 Other chronic pain: Secondary | ICD-10-CM

## 2022-06-18 DIAGNOSIS — M545 Low back pain, unspecified: Secondary | ICD-10-CM

## 2022-06-18 DIAGNOSIS — L89159 Pressure ulcer of sacral region, unspecified stage: Secondary | ICD-10-CM | POA: Diagnosis not present

## 2022-06-18 DIAGNOSIS — E871 Hypo-osmolality and hyponatremia: Secondary | ICD-10-CM

## 2022-06-18 DIAGNOSIS — R7881 Bacteremia: Secondary | ICD-10-CM | POA: Diagnosis not present

## 2022-06-18 DIAGNOSIS — R5381 Other malaise: Secondary | ICD-10-CM

## 2022-06-18 DIAGNOSIS — Z992 Dependence on renal dialysis: Secondary | ICD-10-CM | POA: Diagnosis not present

## 2022-06-18 LAB — BASIC METABOLIC PANEL
Anion gap: 11 (ref 5–15)
BUN: 17 mg/dL (ref 8–23)
CO2: 29 mmol/L (ref 22–32)
Calcium: 8.6 mg/dL — ABNORMAL LOW (ref 8.9–10.3)
Chloride: 93 mmol/L — ABNORMAL LOW (ref 98–111)
Creatinine, Ser: 3.93 mg/dL — ABNORMAL HIGH (ref 0.44–1.00)
GFR, Estimated: 12 mL/min — ABNORMAL LOW (ref >60)
Glucose, Bld: 110 mg/dL — ABNORMAL HIGH (ref 70–99)
Potassium: 3.3 mmol/L — ABNORMAL LOW (ref 3.5–5.1)
Sodium: 133 mmol/L — ABNORMAL LOW (ref 135–145)

## 2022-06-18 LAB — CBC
HCT: 30.3 % — ABNORMAL LOW (ref 36.0–46.0)
Hemoglobin: 10.2 g/dL — ABNORMAL LOW (ref 12.0–15.0)
MCH: 32.1 pg (ref 26.0–34.0)
MCHC: 33.7 g/dL (ref 30.0–36.0)
MCV: 95.3 fL (ref 80.0–100.0)
Platelets: 214 10*3/uL (ref 150–400)
RBC: 3.18 MIL/uL — ABNORMAL LOW (ref 3.87–5.11)
RDW: 18.3 % — ABNORMAL HIGH (ref 11.5–15.5)
WBC: 5.7 10*3/uL (ref 4.0–10.5)
nRBC: 0 % (ref 0.0–0.2)

## 2022-06-18 LAB — MAGNESIUM: Magnesium: 1.5 mg/dL — ABNORMAL LOW (ref 1.7–2.4)

## 2022-06-18 MED ORDER — ORAL CARE MOUTH RINSE: 15.0000 mL | OROMUCOSAL | Status: DC | PRN

## 2022-06-18 MED ORDER — LABETALOL HCL 5 MG/ML IV SOLN
10.0000 mg | INTRAVENOUS | Status: DC | PRN
  Administered 2022-06-19 – 2022-06-21 (×3): 10 mg via INTRAVENOUS
  Filled 2022-06-18 (×3): qty 4

## 2022-06-18 MED ORDER — CHLORHEXIDINE GLUCONATE CLOTH 2 % EX PADS
6.0000 | MEDICATED_PAD | Freq: Every day | CUTANEOUS | Status: DC
Start: 2022-06-19 — End: 2022-06-30
  Administered 2022-06-19 – 2022-06-30 (×10): 6 via TOPICAL

## 2022-06-18 MED ORDER — ACETAMINOPHEN 325 MG PO TABS
650.0000 mg | ORAL_TABLET | Freq: Four times a day (QID) | ORAL | Status: DC | PRN
  Administered 2022-06-18 – 2022-06-19 (×2): 650 mg via ORAL
  Filled 2022-06-18 (×2): qty 2

## 2022-06-18 NOTE — Progress Notes (Signed)
Pharmacy Antibiotic Note  Diamond [name] is a 71 y.o. female admitted on 06/12/2022 with MRSA bacteremia.  Pharmacy has been consulted for vancomycin dosing. Patient got loading dose of vancomycin on 7/9. Patient is s/p TDC removal on 7/10. Patient is on HD schedule every Tuesday, Thursday, Saturday.   Plan: Vancomycin 500 mg qHD-TTS through 07/13/2022 Monitor clinical progress, length of therapy, cultures  Assess pre-HD vanc levels per protocol   Height: '5\' 7"'$  (170.2 cm) Weight: 61.1 kg (134 lb 11.2 oz) IBW/kg (Calculated) : 61.6  Temp (24hrs), Avg:98.3 F (36.8 C), Min:98 F (36.7 C), Max:99 F (37.2 C)  Recent Labs  Lab 06/12/22 1545 06/12/22 1640 06/12/22 1649 06/13/22 0444 06/14/22 0113 06/15/22 0243 06/16/22 0702 06/17/22 0107 06/18/22 0149  WBC 8.6  --   --  7.5 11.9* 7.4 5.3 6.0 5.7  CREATININE 11.73*  --  13.60* 12.08* 7.05*  --   --  5.48* 3.93*  LATICACIDVEN 1.0 1.5  --   --  1.0  --   --   --   --      Estimated Creatinine Clearance: 12.5 mL/min (A) (by C-G formula based on SCr of 3.93 mg/dL (H)).    Allergies  Allergen Reactions   Shellfish Allergy     Unknown reaction     Antimicrobials this admission: Ceftriaxone 7/8 x1 Vancomycin 7/8 >>   Microbiology results: 7/8 Bcx 2/2: MRSA 7/10 Bcx: ngtd 7/10 Cath tip: 20K col/ml MRSA 7/11 Bcx: ngtd  Jeneen Rinks, Pharm.D PGY1 Pharmacy Resident  06/18/2022 1:27 PM

## 2022-06-18 NOTE — Progress Notes (Signed)
[name]  Subjective: seen in room, no new c/o's. TEE yesterday w/o vegetation.   Vitals:   06/18/22 0000 06/18/22 0400 06/18/22 0457 06/18/22 0742  BP: (!) 142/83 140/89  124/77  Pulse: 97 92  88  Resp: '19 16  17  '$ Temp: 98 F (36.7 C) 98.3 F (36.8 C)  99 F (37.2 C)  TempSrc: Oral Oral  Oral  SpO2: 96% 99%    Weight:   61.1 kg   Height:        Exam:  alert, nad , chron ill appearing, slumped in bed  no jvd  Chest cta bilat  Cor reg no RG  Abd soft ntnd no ascites   Ext no LE edema   Alert, nonfocal, gen weakness      (TDC removed)  +LUE AVF w/ bruit     Home meds: apixaban 2.5 bid, metoprolol 25 bid, pantoprazole, sevelamer carbonate 2 ac tid    OP HD: [place] TTS 4h  60.3kg  400/600  2/2 bath  LUE AVF (TDC removed here) Hep none - mircera 150 q2, last 6/24 - hectorol 4 ug iv tts   Assessment/ Plan: MRSA Bacteremia - H/o MRSA bacteremia/endocarditis. IV Vancomycin per primary. For TEE. TDC removed on 7/10 by [facility]. Using AVF now.  Gallbladder mass - gen surg team consulting, per notes they are concerned about GB adenoCa and are recommending GB removal while here if possible. Per pmd/ gen surg. AMS - No acute findings on head CT. Possibly infection related. Improved.  ESRD -  HD TTS. Next HD Sat on schedule.  Volume  - BPs good, no sig vol overload. Keep even w/ HD.  Anemia  - Hgb 10- 12. No ESA needs.  Metabolic bone disease -  Phos high 8s. Continue Renvela binders.  HTN - on diltiazem, bp's good Hx atrial flutter - getting diltiazem 60 tid here     Diamond [name] 06/18/2022, 9:14 AM   Recent Labs  Lab 06/12/22 1545 06/12/22 1649 06/13/22 0444 06/14/22 0113 06/17/22 0107 06/18/22 0149  HGB 12.7   < > 11.2*   < > 10.1* 10.2*  ALBUMIN 3.0*  --  2.6*  --   --   --   CALCIUM 9.3  --  9.0   < > 8.9 8.6*  PHOS  --   --  8.9*  --   --   --   CREATININE 11.73*   < > 12.08*   < > 5.48* 3.93*  K 5.6*   < > 5.0   < > 3.8 3.3*   <  > = values in this interval not displayed.    No results for input(s): "IRON", "TIBC", "FERRITIN" in the last 168 hours. Inpatient medications:  apixaban  2.5 mg Oral BID   Chlorhexidine Gluconate Cloth  6 each Topical Q0600   diltiazem  60 mg Oral Q8H   pantoprazole  40 mg Oral BID   sevelamer carbonate  1,600 mg Oral TID WC    vancomycin Stopped (06/17/22 1653)   acetaminophen, acetaminophen, HYDROmorphone (DILAUDID) injection, labetalol, lidocaine HCl (PF), oxyCODONE, prochlorperazine

## 2022-06-18 NOTE — Progress Notes (Signed)
[facility]  Date of Admission:  06/12/2022           Reason for visit: Follow up on MRSA bacteremia   Current antibiotics: Vancomycin 7/9 -- present  ASSESSMENT:    71 y.o. female admitted with:  MRSA Bacteremia: Patient admitted with positive blood cultures 06/12/2022.  HD catheter removed 7/10 and repeat cultures 7/11 are NGTD.  Patient also has a history of MRSA bacteremia complicated by L3-T3 vertebral infection and mitral valve thickening/fibrinous density (TTE) with possible infected right atrial thrombus/vegetation (TEE) in January 2023.  Status post 8 weeks of vancomycin with appropriate line holiday at that time (old right IJ TDC removed 12/19/21, repeat cx negative, new right IJ TDC placed 12/23/21).  However, suspect her current bacteremia was due to another line infection that remained in place until removal 7/11.  Status post repeat TEE 7/13 with no evidence of endocarditis on her valves.  She had a very small echodensity noted in RVOT that was seen on prior imaging and noted to be less promninent. End-stage renal disease: On hemodialysis. Gallbladder mass: Seen by surgery and concerning for adenocarcinoma.   RECOMMENDATIONS:    Continue vancomycin dosed with HD Will do 4 weeks from date of negative blood cx obtained post line removal.  End date 07/13/22 Will arrange outpatient follow up Will sign off, please call as needed   Principal Problem:   MRSA bacteremia Active Problems:   Hypertension   Gallbladder mass   ESRD on dialysis ([place])   Paroxysmal atrial flutter (HCC)   Acute metabolic encephalopathy   Pressure injury of skin    MEDICATIONS:    Scheduled Meds:  apixaban  2.5 mg Oral BID   Chlorhexidine Gluconate Cloth  6 each Topical Q0600   diltiazem  60 mg Oral Q8H   pantoprazole  40 mg Oral BID   sevelamer carbonate  1,600 mg Oral TID WC   Continuous Infusions:  vancomycin Stopped (06/17/22 1653)   PRN Meds:.acetaminophen,  acetaminophen, HYDROmorphone (DILAUDID) injection, labetalol, lidocaine HCl (PF), oxyCODONE, prochlorperazine  SUBJECTIVE:   No new complaints. Asking when she will go home.  No fevers, chills.  Tolerated procedure yesterday.   Review of Systems  All other systems reviewed and are negative.     OBJECTIVE:   Blood pressure 124/77, pulse 88, temperature 99 F (37.2 C), temperature source Oral, resp. rate 17, height '5\' 7"'$  (1.702 m), weight 61.1 kg, SpO2 99 %. Body mass index is 21.1 kg/m.  Physical Exam Constitutional:      Appearance: Normal appearance.  HENT:     Head: Normocephalic and atraumatic.  Eyes:     Extraocular Movements: Extraocular movements intact.     Conjunctiva/sclera: Conjunctivae normal.  Pulmonary:     Effort: Pulmonary effort is normal. No respiratory distress.  Abdominal:     General: There is no distension.     Palpations: Abdomen is soft.  Musculoskeletal:        General: Normal range of motion.  Skin:    General: Skin is warm and dry.  Neurological:     General: No focal deficit present.     Mental Status: She is alert and oriented to person, place, and time.  Psychiatric:        Mood and Affect: Mood normal.        Behavior: Behavior normal.      Lab Results: Lab Results  Component Value Date   WBC 5.7 06/18/2022  HGB 10.2 (L) 06/18/2022   HCT 30.3 (L) 06/18/2022   MCV 95.3 06/18/2022   PLT 214 06/18/2022    Lab Results  Component Value Date   NA 133 (L) 06/18/2022   K 3.3 (L) 06/18/2022   CO2 29 06/18/2022   GLUCOSE 110 (H) 06/18/2022   BUN 17 06/18/2022   CREATININE 3.93 (H) 06/18/2022   CALCIUM 8.6 (L) 06/18/2022   GFRNONAA 12 (L) 06/18/2022   GFRAA 14 (L) 11/12/2019    Lab Results  Component Value Date   ALT 10 06/12/2022   AST 16 06/12/2022   ALKPHOS 56 06/12/2022   BILITOT 1.1 06/12/2022       Component Value Date/Time   CRP 6.2 (H) 01/21/2022 0228       Component Value Date/Time   ESRSEDRATE 64 (H)  12/17/2021 0630     I have reviewed the micro and lab results in Epic.  Imaging: No results found.   Imaging independently reviewed in Epic.    Raynelle Highland for Infectious Disease [facility] [phone] pager 06/18/2022, 10:07 AM

## 2022-06-18 NOTE — Progress Notes (Signed)
PROGRESS NOTE  Diamond [name]:[id number] DOB: May 23, 1951   PCP: [name], MD  Patient is from: Home.  DOA: 06/12/2022 LOS: 6  Chief complaints Nausea, vomiting and diarrhea    Brief Narrative / Interim history: 71 year old F with PMH of ESRD on HD, MRSA, L5-S1 discitis, endocarditis, gallbladder mass, ACD, HTN and paroxysmal A-fib presenting with nausea, vomiting and diarrhea, and found to have MRSA bacteremia likely from dialysis catheter.  CT abdomen and pelvis also showed previously known gallbladder mass suspicious for cholangiocarcinoma.  Patient was started on IV vancomycin.  ID, nephrology and general surgery consulted.   Dialysis catheter removed.  Repeat blood culture NGTD.  TEE negative for vegetation but similar very small mobile echodensity seen in RVOT which was better visualized on prior TTE on 03/03/2022.  ID recommends vancomycin with HD for 4 weeks with end date of 07/13/2022.  General surgery planning to take patient to the OR for gallbladder mass next week.  Hospital course noteworthy of acute metabolic encephalopathy and paroxysmal A-fib with RVR  Subjective: Seen and examined earlier this morning.  No major events overnight of this morning.  Has no complaints other than back pain for which she has been waiting on Tylenol.  She states she has not had a bowel movement in days.  She denies chest pain, dyspnea, cough, GI or UTI symptoms  Objective: Vitals:   06/18/22 0000 06/18/22 0400 06/18/22 0457 06/18/22 0742  BP: (!) 142/83 140/89  124/77  Pulse: 97 92  88  Resp: '19 16  17  '$ Temp: 98 F (36.7 C) 98.3 F (36.8 C)  99 F (37.2 C)  TempSrc: Oral Oral  Oral  SpO2: 96% 99%    Weight:   61.1 kg   Height:        Examination:  GENERAL: No apparent distress.  Nontoxic. HEENT: MMM.  Vision and hearing grossly intact.  NECK: Supple.  No apparent JVD.  RESP:  No IWOB.  Fair aeration bilaterally. CVS:  RRR. Heart sounds normal.  ABD/GI/GU: BS+. Abd  soft, NTND.  MSK/EXT:  Moves extremities. No apparent deformity. No edema.  SKIN: no apparent skin lesion or wound NEURO: Awake, alert and oriented fairly.  No apparent focal neuro deficit. PSYCH: Calm. Normal affect.   Procedures:  7/13-TEE.  Microbiology summarized: 7/8-COVID-19 and influenza PCR nonreactive. 7/8-blood culture with MRSA 7/10-catheter tip culture with MRSA. 7/10-repeat blood culture NGTD 7/11-repeat blood culture NGTD  Assessment and plan: Principal Problem:   MRSA bacteremia Active Problems:   Gallbladder mass   Hypertension   ESRD on dialysis ([place])   Paroxysmal atrial flutter ([facility])   Acute metabolic encephalopathy   Pressure injury of skin  MRSA bacteremia: Thought to be from HD cath.  Previous history of MRSA bacteremia and L5-S1 discitis and endocarditis about 6 months ago.  TDC removed on 7/10.  Catheter tip culture with MRSA.  TEE negative for vegetation but but similar very small mobile echodensity seen in RVOT which was better visualized on prior TTE on 03/03/2022.  Repeat blood culture on 7/10 and 7/11 NGTD. -ID recommended vancomycin with HD and total 07/13/2022.   Known history of gallbladder mass -has not followed up with general surgery as outpatient.   -Surgery planning to take her to the OR the week of 7/17 if she agrees  ESRD on HD MWF: TDC removed due to bacteremia.  Using aVF for HD. Anemia of renal disease: Stable Hyponatremia/hyperkalemia/hypokalemia -Per nephrology  Essential hypertension: Normotensive. -Continue Cardizem   Paroxysmal  atrial flutter/A-fib with RVR: RVR seems to have resolved.  -Continue p.o. Cardizem-May need to consolidate this to Cardizem CD -Continue Eliquis for anticoagulation -Cardiology following.   Acute metabolic encephalopathy: Seems to have resolved.  Oriented x4. -Reorientation and delirium precautions -Minimize sedating medication  Chronic back pain: CT abdomen and pelvis showed degenerative changes.   Pain seems to be well managed with Tylenol.  No focal neurodeficits -Low threshold for MRI spines if persistent and significant back pain given MRSA bacteremia and history of discitis. -Continue Tylenol as needed  Physical deconditioning -PT/OT eval   Increased nutrient needs Body mass index is 21.1 kg/m.  Pressure ulcer stage II at the coccyx. Pressure Injury Coccyx Stage 2 -  Partial thickness loss of dermis presenting as a shallow open injury with a red, pink wound bed without slough. (Active)     Location: Coccyx  Location Orientation:   Staging: Stage 2 -  Partial thickness loss of dermis presenting as a shallow open injury with a red, pink wound bed without slough.  Wound Description (Comments):   Present on Admission: Yes  Dressing Type Foam - Lift dressing to assess site every shift 06/17/22 1936   DVT prophylaxis:  apixaban (ELIQUIS) tablet 2.5 mg Start: 06/16/22 1445 apixaban (ELIQUIS) tablet 2.5 mg  Code Status: Full code Family Communication: None at bedside Level of care: Telemetry Medical Status is: Inpatient Remains inpatient appropriate because: Due to MRSA bacteremia and gallbladder mass   Final disposition: TBD Consultants:  Cardiology Infectious disease Nephrology Vascular surgery Interventional radiology General surgery  Sch Meds:  Scheduled Meds:  apixaban  2.5 mg Oral BID   Chlorhexidine Gluconate Cloth  6 each Topical Q0600   diltiazem  60 mg Oral Q8H   pantoprazole  40 mg Oral BID   sevelamer carbonate  1,600 mg Oral TID WC   Continuous Infusions:  vancomycin Stopped (06/17/22 1653)   PRN Meds:.acetaminophen, acetaminophen, HYDROmorphone (DILAUDID) injection, labetalol, lidocaine HCl (PF), oxyCODONE, prochlorperazine  Antimicrobials: Anti-infectives (From admission, onward)    Start     Dose/Rate Route Frequency Ordered Stop   06/15/22 1200  vancomycin (VANCOREADY) IVPB 500 mg/100 mL        500 mg 100 mL/hr over 60 Minutes Intravenous  Every T-Th-Sa (Hemodialysis) 06/15/22 1028 07/13/22 2359   06/14/22 1200  vancomycin (VANCOREADY) IVPB 500 mg/100 mL        500 mg 100 mL/hr over 60 Minutes Intravenous Every M-W-F (Hemodialysis) 06/14/22 0814 06/14/22 1714   06/13/22 1130  cefTRIAXone (ROCEPHIN) 2 g in sodium chloride 0.9 % 100 mL IVPB  Status:  Discontinued        2 g 200 mL/hr over 30 Minutes Intravenous Every 24 hours 06/13/22 1118 06/13/22 1249   06/13/22 0800  vancomycin (VANCOREADY) IVPB 1250 mg/250 mL        1,250 mg 166.7 mL/hr over 90 Minutes Intravenous  Once 06/13/22 0757 06/13/22 1223   06/13/22 0757  vancomycin variable dose per unstable renal function (pharmacist dosing)  Status:  Discontinued         Does not apply See admin instructions 06/13/22 0757 06/15/22 1029        I have personally reviewed the following labs and images: CBC: Recent Labs  Lab 06/12/22 1545 06/12/22 1649 06/13/22 0444 06/14/22 0113 06/15/22 0243 06/16/22 0702 06/17/22 0107 06/18/22 0149  WBC 8.6  --  7.5 11.9* 7.4 5.3 6.0 5.7  NEUTROABS 7.6  --  6.3 10.2* 5.9  --   --   --  HGB 12.7   < > 11.2* 11.6* 10.9* 10.1* 10.1* 10.2*  HCT 37.5   < > 34.7* 33.3* 32.2* 29.8* 28.9* 30.3*  MCV 95.9  --  98.6 94.3 95.3 96.1 92.9 95.3  PLT 157  --  136* 150 155 175 196 214   < > = values in this interval not displayed.   BMP &GFR Recent Labs  Lab 06/12/22 1545 06/12/22 1649 06/13/22 0444 06/14/22 0113 06/17/22 0107 06/18/22 0149  NA 129* 127*  128* 129* 135 129* 133*  K 5.6* 5.5*  5.4* 5.0 3.9 3.8 3.3*  CL 94* 99 97* 92* 91* 93*  CO2 16*  --  14* 18* 25 29  GLUCOSE 90 83 70 63* 104* 110*  BUN 91* 80* 94* 43* 29* 17  CREATININE 11.73* 13.60* 12.08* 7.05* 5.48* 3.93*  CALCIUM 9.3  --  9.0 9.0 8.9 8.6*  MG  --   --   --   --  1.5* 1.5*  PHOS  --   --  8.9*  --   --   --    Estimated Creatinine Clearance: 12.5 mL/min (A) (by C-G formula based on SCr of 3.93 mg/dL (H)). Liver & Pancreas: Recent Labs  Lab 06/12/22 1545  06/13/22 0444  AST 16  --   ALT 10  --   ALKPHOS 56  --   BILITOT 1.1  --   PROT 7.0  --   ALBUMIN 3.0* 2.6*   Recent Labs  Lab 06/12/22 1545  LIPASE 52*   Recent Labs  Lab 06/12/22 1545  AMMONIA 40*   Diabetic: No results for input(s): "HGBA1C" in the last 72 hours. Recent Labs  Lab 06/12/22 1631 06/15/22 2047  GLUCAP 100* 150*   Cardiac Enzymes: No results for input(s): "CKTOTAL", "CKMB", "CKMBINDEX", "TROPONINI" in the last 168 hours. No results for input(s): "PROBNP" in the last 8760 hours. Coagulation Profile: Recent Labs  Lab 06/12/22 1545  INR 1.2   Thyroid Function Tests: No results for input(s): "TSH", "T4TOTAL", "FREET4", "T3FREE", "THYROIDAB" in the last 72 hours. Lipid Profile: No results for input(s): "CHOL", "HDL", "LDLCALC", "TRIG", "CHOLHDL", "LDLDIRECT" in the last 72 hours. Anemia Panel: No results for input(s): "VITAMINB12", "FOLATE", "FERRITIN", "TIBC", "IRON", "RETICCTPCT" in the last 72 hours. Urine analysis:    Component Value Date/Time   COLORURINE STRAW (A) 06/12/2022 2040   APPEARANCEUR CLEAR 06/12/2022 2040   LABSPEC 1.006 06/12/2022 2040   PHURINE 7.0 06/12/2022 2040   GLUCOSEU NEGATIVE 06/12/2022 2040   HGBUR SMALL (A) 06/12/2022 2040   BILIRUBINUR NEGATIVE 06/12/2022 2040   KETONESUR NEGATIVE 06/12/2022 2040   PROTEINUR 30 (A) 06/12/2022 2040   UROBILINOGEN 0.2 09/27/2021 1329   NITRITE NEGATIVE 06/12/2022 2040   LEUKOCYTESUR NEGATIVE 06/12/2022 2040   Sepsis Labs: Invalid input(s): "PROCALCITONIN", "LACTICIDVEN"  Microbiology: Recent Results (from the past 240 hour(s))  Resp Panel by RT-PCR (Flu A&B, Covid) Anterior Nasal Swab     Status: None   Collection Time: 06/12/22  3:08 PM   Specimen: Anterior Nasal Swab  Result Value Ref Range Status   SARS Coronavirus 2 by RT PCR NEGATIVE NEGATIVE Final    Comment: (NOTE) SARS-CoV-2 target nucleic acids are NOT DETECTED.  The SARS-CoV-2 RNA is generally detectable in upper  respiratory specimens during the acute phase of infection. The lowest concentration of SARS-CoV-2 viral copies this assay can detect is 138 copies/mL. A negative result does not preclude SARS-Cov-2 infection and should not be used as the sole basis for treatment or  other patient management decisions. A negative result may occur with  improper specimen collection/handling, submission of specimen other than nasopharyngeal swab, presence of viral mutation(s) within the areas targeted by this assay, and inadequate number of viral copies(<138 copies/mL). A negative result must be combined with clinical observations, patient history, and epidemiological information. The expected result is Negative.  Fact Sheet for Patients:  EntrepreneurPulse.com.au  Fact Sheet for Healthcare Providers:  IncredibleEmployment.be  This test is no t yet approved or cleared by the [place] FDA and  has been authorized for detection and/or diagnosis of SARS-CoV-2 by FDA under an Emergency Use Authorization (EUA). This EUA will remain  in effect (meaning this test can be used) for the duration of the COVID-19 declaration under Section 564(b)(1) of the Act, 21 U.S.C.section 360bbb-3(b)(1), unless the authorization is terminated  or revoked sooner.       Influenza A by PCR NEGATIVE NEGATIVE Final   Influenza B by PCR NEGATIVE NEGATIVE Final    Comment: (NOTE) The Xpert Xpress SARS-CoV-2/FLU/RSV plus assay is intended as an aid in the diagnosis of influenza from Nasopharyngeal swab specimens and should not be used as a sole basis for treatment. Nasal washings and aspirates are unacceptable for Xpert Xpress SARS-CoV-2/FLU/RSV testing.  Fact Sheet for Patients: EntrepreneurPulse.com.au  Fact Sheet for Healthcare Providers: IncredibleEmployment.be  This test is not yet approved or cleared by the [place] FDA and has been  authorized for detection and/or diagnosis of SARS-CoV-2 by FDA under an Emergency Use Authorization (EUA). This EUA will remain in effect (meaning this test can be used) for the duration of the COVID-19 declaration under Section 564(b)(1) of the Act, 21 U.S.C. section 360bbb-3(b)(1), unless the authorization is terminated or revoked.  Performed at [facility], [place] [address] [place], [place] [address]   Blood Culture (routine x 2)     Status: Abnormal   Collection Time: 06/12/22  3:11 PM   Specimen: BLOOD LEFT HAND  Result Value Ref Range Status   Specimen Description BLOOD LEFT HAND  Final   Special Requests   Final    BOTTLES DRAWN AEROBIC ONLY Blood Culture results may not be optimal due to an inadequate volume of blood received in culture bottles   Culture  Setup Time   Final    GRAM POSITIVE COCCI IN CLUSTERS AEROBIC BOTTLE ONLY CRITICAL VALUE NOTED.  VALUE IS CONSISTENT WITH PREVIOUSLY REPORTED AND CALLED VALUE.    Culture (A)  Final    STAPHYLOCOCCUS AUREUS SUSCEPTIBILITIES PERFORMED ON PREVIOUS CULTURE WITHIN THE LAST 5 DAYS. Performed at [facility], [place] [address], [place], [place] [address]    Report Status 06/15/2022 FINAL  Final  Blood Culture (routine x 2)     Status: Abnormal   Collection Time: 06/12/22  3:45 PM   Specimen: BLOOD  Result Value Ref Range Status   Specimen Description BLOOD SITE NOT SPECIFIED  Final   Special Requests   Final    BOTTLES DRAWN AEROBIC AND ANAEROBIC Blood Culture results may not be optimal due to an inadequate volume of blood received in culture bottles   Culture  Setup Time   Final    GRAM POSITIVE COCCI IN BOTH AEROBIC AND ANAEROBIC BOTTLES CRITICAL RESULT CALLED TO, READ BACK BY AND VERIFIED WITH: PHARMD M.MACCIA AT 3664 ON 06/13/2022 BY T.SAAD. Performed at [facility], [place] [address] [place],  [address]    Culture METHICILLIN RESISTANT STAPHYLOCOCCUS AUREUS (A)  Final   Report  Status 06/15/2022  FINAL  Final   Organism ID, Bacteria METHICILLIN RESISTANT STAPHYLOCOCCUS AUREUS  Final      Susceptibility   Methicillin resistant staphylococcus aureus - MIC*    CIPROFLOXACIN <=0.5 SENSITIVE Sensitive     ERYTHROMYCIN <=0.25 SENSITIVE Sensitive     GENTAMICIN <=0.5 SENSITIVE Sensitive     OXACILLIN >=4 RESISTANT Resistant     TETRACYCLINE >=16 RESISTANT Resistant     VANCOMYCIN 1 SENSITIVE Sensitive     TRIMETH/SULFA <=10 SENSITIVE Sensitive     CLINDAMYCIN <=0.25 SENSITIVE Sensitive     RIFAMPIN <=0.5 SENSITIVE Sensitive     Inducible Clindamycin NEGATIVE Sensitive     * METHICILLIN RESISTANT STAPHYLOCOCCUS AUREUS  Blood Culture ID Panel (Reflexed)     Status: Abnormal   Collection Time: 06/12/22  3:45 PM  Result Value Ref Range Status   Enterococcus faecalis NOT DETECTED NOT DETECTED Final   Enterococcus Faecium NOT DETECTED NOT DETECTED Final   Listeria monocytogenes NOT DETECTED NOT DETECTED Final   Staphylococcus species DETECTED (A) NOT DETECTED Final    Comment: CRITICAL RESULT CALLED TO, READ BACK BY AND VERIFIED WITH: PHARMD M.MACCIA AT 0750 ON 06/13/2022 BY T.SAAD.    Staphylococcus aureus (BCID) DETECTED (A) NOT DETECTED Final    Comment: Methicillin (oxacillin)-resistant Staphylococcus aureus (MRSA). MRSA is predictably resistant to beta-lactam antibiotics (except ceftaroline). Preferred therapy is vancomycin unless clinically contraindicated. Patient requires contact precautions if  hospitalized. CRITICAL RESULT CALLED TO, READ BACK BY AND VERIFIED WITH: PHARMD M.MACCIA AT 0750 ON 06/13/2022 BY T.SAAD.    Staphylococcus epidermidis NOT DETECTED NOT DETECTED Final   Staphylococcus lugdunensis NOT DETECTED NOT DETECTED Final   Streptococcus species NOT DETECTED NOT DETECTED Final   Streptococcus agalactiae NOT DETECTED NOT DETECTED Final   Streptococcus pneumoniae NOT DETECTED NOT DETECTED Final   Streptococcus pyogenes NOT DETECTED NOT DETECTED Final    A.calcoaceticus-baumannii NOT DETECTED NOT DETECTED Final   Bacteroides fragilis NOT DETECTED NOT DETECTED Final   Enterobacterales NOT DETECTED NOT DETECTED Final   Enterobacter cloacae complex NOT DETECTED NOT DETECTED Final   Escherichia coli NOT DETECTED NOT DETECTED Final   Klebsiella aerogenes NOT DETECTED NOT DETECTED Final   Klebsiella oxytoca NOT DETECTED NOT DETECTED Final   Klebsiella pneumoniae NOT DETECTED NOT DETECTED Final   Proteus species NOT DETECTED NOT DETECTED Final   Salmonella species NOT DETECTED NOT DETECTED Final   Serratia marcescens NOT DETECTED NOT DETECTED Final   Haemophilus influenzae NOT DETECTED NOT DETECTED Final   Neisseria meningitidis NOT DETECTED NOT DETECTED Final   Pseudomonas aeruginosa NOT DETECTED NOT DETECTED Final   Stenotrophomonas maltophilia NOT DETECTED NOT DETECTED Final   Candida albicans NOT DETECTED NOT DETECTED Final   Candida auris NOT DETECTED NOT DETECTED Final   Candida glabrata NOT DETECTED NOT DETECTED Final   Candida krusei NOT DETECTED NOT DETECTED Final   Candida parapsilosis NOT DETECTED NOT DETECTED Final   Candida tropicalis NOT DETECTED NOT DETECTED Final   Cryptococcus neoformans/gattii NOT DETECTED NOT DETECTED Final   Meth resistant mecA/C and MREJ DETECTED (A) NOT DETECTED Final    Comment: CRITICAL RESULT CALLED TO, READ BACK BY AND VERIFIED WITH: PHARMD M.MACCIA AT 0750 ON 06/13/2022 BY T.SAAD. Performed at [facility], [place] [address] [place], [place] [address]   Culture, blood (Routine X 2) w Reflex to ID Panel     Status: None (Preliminary result)   Collection Time: 06/14/22  1:08 AM   Specimen: BLOOD  Result Value Ref  Range Status   Specimen Description BLOOD RIGHT ANTECUBITAL  Final   Special Requests   Final    BOTTLES DRAWN AEROBIC ONLY Blood Culture adequate volume   Culture   Final    NO GROWTH 4 DAYS Performed at [facility], [address] [place], [place] [address]    Report  Status PENDING  Incomplete  Culture, blood (Routine X 2) w Reflex to ID Panel     Status: None (Preliminary result)   Collection Time: 06/14/22  1:13 AM   Specimen: BLOOD RIGHT HAND  Result Value Ref Range Status   Specimen Description BLOOD RIGHT HAND  Final   Special Requests   Final    BOTTLES DRAWN AEROBIC AND ANAEROBIC Blood Culture adequate volume   Culture   Final    NO GROWTH 4 DAYS Performed at [facility], [place] [address] [place], [place] [address]    Report Status PENDING  Incomplete  Cath Tip Culture     Status: Abnormal   Collection Time: 06/14/22  3:23 PM   Specimen: Catheter Tip; Other  Result Value Ref Range Status   Specimen Description CATH TIP  Final   Special Requests   Final    NONE Performed at [facility], [place] [address] [place], [place] [address]    Culture (A)  Final    20,000 COLONIES/mL METHICILLIN RESISTANT STAPHYLOCOCCUS AUREUS   Report Status 06/17/2022 FINAL  Final   Organism ID, Bacteria METHICILLIN RESISTANT STAPHYLOCOCCUS AUREUS (A)  Final      Susceptibility   Methicillin resistant staphylococcus aureus - MIC*    CIPROFLOXACIN <=0.5 SENSITIVE Sensitive     ERYTHROMYCIN <=0.25 SENSITIVE Sensitive     GENTAMICIN <=0.5 SENSITIVE Sensitive     OXACILLIN RESISTANT Resistant     TETRACYCLINE >=16 RESISTANT Resistant     VANCOMYCIN 1 SENSITIVE Sensitive     TRIMETH/SULFA <=10 SENSITIVE Sensitive     CLINDAMYCIN <=0.25 SENSITIVE Sensitive     RIFAMPIN <=0.5 SENSITIVE Sensitive     Inducible Clindamycin NEGATIVE Sensitive     * 20,000 COLONIES/mL METHICILLIN RESISTANT STAPHYLOCOCCUS AUREUS  MRSA Next Gen by PCR, Nasal     Status: Abnormal   Collection Time: 06/15/22  8:17 AM   Specimen: Nasal Mucosa; Nasal Swab  Result Value Ref Range Status   MRSA by PCR Next Gen DETECTED (A) NOT DETECTED Final    Comment: RESULT CALLED TO, READ BACK BY AND VERIFIED WITH: RN Nathanial Rancher [id number] AT 1203 BY EC (NOTE) The GeneXpert MRSA Assay  (FDA approved for NASAL specimens only), is one component of a comprehensive MRSA colonization surveillance program. It is not intended to diagnose MRSA infection nor to guide or monitor treatment for MRSA infections. Test performance is not FDA approved in patients less than 70 years old. Performed at [facility], [place] [address] [place], [place] [address]   Culture, blood (Routine X 2) w Reflex to ID Panel     Status: None (Preliminary result)   Collection Time: 06/15/22 10:20 AM   Specimen: BLOOD  Result Value Ref Range Status   Specimen Description BLOOD BLOOD RIGHT HAND  Final   Special Requests AEROBIC BOTTLE ONLY Blood Culture adequate volume  Final   Culture   Final    NO GROWTH 3 DAYS Performed at [facility], [place] [address] [place], [place] [address]    Report Status PENDING  Incomplete  Culture, blood (Routine X 2) w Reflex  to ID Panel     Status: None (Preliminary result)   Collection Time: 06/15/22 10:21 AM   Specimen: BLOOD  Result Value Ref Range Status   Specimen Description BLOOD BLOOD RIGHT WRIST  Final   Special Requests AEROBIC BOTTLE ONLY Blood Culture adequate volume  Final   Culture   Final    NO GROWTH 3 DAYS Performed at [facility], [address], [place], [place] [address]    Report Status PENDING  Incomplete    Radiology Studies: No results found.    Gayland [address] [place]  If 7PM-7AM, please contact night-coverage [email or web address] 06/18/2022, 1:03 PM

## 2022-06-18 NOTE — Progress Notes (Signed)
General Surgery  TEE showed no evidence of endocarditis, with overall normal cardiac function. I had a discussion with patient at bedside again this morning her likely diagnosis of gallbladder cancer. She remembered discussing gallbladder surgery with me a few days ago but is still not sure that she wants to undergo an operation. I reviewed the potential options which include surgery, with possible adjuvant chemotherapy depending on her final pathologic stage; vs palliative treatment options vs no treatment. I discussed that if this is in fact gallbladder cancer, surgery is the only potentially curative treatment but even with complete resection there is risk of recurrence. If she opts not to have any treatment this will likely progress given the aggressive nature of gallbladder adenocarcinoma. Even with her medical comorbidities I feel that she is a candidate for surgery. She would like to consider her options before making a decision. I attempted to call her son at her request but was not able to reach him. If she agrees to surgery I will try to schedule this for the end of next week as long as remains medically stable. I am concerned that if she is discharged from the hospital it will be very difficult for her to come to clinic and have her surgery scheduled electively.   Michaelle Birks, [place] [facility] General, Hepatobiliary and Pancreatic Surgery 06/18/22 1:05 PM

## 2022-06-18 NOTE — Progress Notes (Signed)
Outpatient IV Antibiotic Plan   Indication: MRSA bacteremia Regimen: Vancomycin 500 mg every TTHSa with HD End date: 07/13/22  No formal OPAT will be done as patient will receive antibiotics at her dialysis center as an outpatient.     Thank you for allowing pharmacy to be a part of this patient's care.  Jimmy Footman, PharmD, BCPS, BCIDP Infectious Diseases Clinical Pharmacist Phone: [phone] 06/18/2022, 9:25 AM

## 2022-06-19 DIAGNOSIS — R7881 Bacteremia: Secondary | ICD-10-CM | POA: Diagnosis not present

## 2022-06-19 DIAGNOSIS — I10 Essential (primary) hypertension: Secondary | ICD-10-CM | POA: Diagnosis not present

## 2022-06-19 DIAGNOSIS — L89159 Pressure ulcer of sacral region, unspecified stage: Secondary | ICD-10-CM | POA: Diagnosis not present

## 2022-06-19 DIAGNOSIS — K828 Other specified diseases of gallbladder: Secondary | ICD-10-CM | POA: Diagnosis not present

## 2022-06-19 LAB — RENAL FUNCTION PANEL
Albumin: 2.1 g/dL — ABNORMAL LOW (ref 3.5–5.0)
Anion gap: 14 (ref 5–15)
BUN: 28 mg/dL — ABNORMAL HIGH (ref 8–23)
CO2: 27 mmol/L (ref 22–32)
Calcium: 9.1 mg/dL (ref 8.9–10.3)
Chloride: 85 mmol/L — ABNORMAL LOW (ref 98–111)
Creatinine, Ser: 5.61 mg/dL — ABNORMAL HIGH (ref 0.44–1.00)
GFR, Estimated: 8 mL/min — ABNORMAL LOW (ref >60)
Glucose, Bld: 83 mg/dL (ref 70–99)
Phosphorus: 4.1 mg/dL (ref 2.5–4.6)
Potassium: 3.9 mmol/L (ref 3.5–5.1)
Sodium: 126 mmol/L — ABNORMAL LOW (ref 135–145)

## 2022-06-19 LAB — CULTURE, BLOOD (ROUTINE X 2)
Culture: NO GROWTH
Culture: NO GROWTH
Special Requests: ADEQUATE
Special Requests: ADEQUATE

## 2022-06-19 LAB — CBC
HCT: 28.4 % — ABNORMAL LOW (ref 36.0–46.0)
Hemoglobin: 9.5 g/dL — ABNORMAL LOW (ref 12.0–15.0)
MCH: 32 pg (ref 26.0–34.0)
MCHC: 33.5 g/dL (ref 30.0–36.0)
MCV: 95.6 fL (ref 80.0–100.0)
Platelets: 225 10*3/uL (ref 150–400)
RBC: 2.97 MIL/uL — ABNORMAL LOW (ref 3.87–5.11)
RDW: 19.1 % — ABNORMAL HIGH (ref 11.5–15.5)
WBC: 5.8 10*3/uL (ref 4.0–10.5)
nRBC: 0 % (ref 0.0–0.2)

## 2022-06-19 LAB — VANCOMYCIN, RANDOM: Vancomycin Rm: 15 ug/mL

## 2022-06-19 MED ORDER — DILTIAZEM HCL ER COATED BEADS 240 MG PO CP24
240.0000 mg | ORAL_CAPSULE | Freq: Every day | ORAL | Status: DC
  Administered 2022-06-20 – 2022-06-24 (×5): 240 mg via ORAL
  Filled 2022-06-19 (×5): qty 1

## 2022-06-19 MED ORDER — DILTIAZEM HCL 60 MG PO TABS
30.0000 mg | ORAL_TABLET | Freq: Four times a day (QID) | ORAL | Status: DC | PRN
  Administered 2022-06-19 – 2022-06-24 (×2): 30 mg via ORAL
  Filled 2022-06-19: qty 1

## 2022-06-19 MED ORDER — LIP MEDEX EX OINT
TOPICAL_OINTMENT | CUTANEOUS | Status: DC | PRN
Start: 2022-06-19 — End: 2022-06-30
  Filled 2022-06-19: qty 7

## 2022-06-19 NOTE — Progress Notes (Signed)
PROGRESS NOTE  Diamond [name] SWF:[id number] DOB: Feb 08, 1951   PCP: [name], MD  Patient is from: Home.  Lives alone.  Uses walker at baseline.  DOA: 06/12/2022 LOS: 7  Chief complaints Nausea, vomiting and diarrhea    Brief Narrative / Interim history: 71 year old F with PMH of ESRD on HD TTS, prior MRSA bacteremia with L5-S1 discitis and endocarditis, gallbladder mass, ACD, HTN and paroxysmal A-fib presenting with nausea, vomiting and diarrhea, and found to have MRSA bacteremia likely from dialysis catheter.  CT abdomen and pelvis also showed previously known gallbladder mass suspicious for cholangiocarcinoma.  Patient was started on IV vancomycin.  ID, nephrology and general surgery consulted.   Dialysis catheter removed.  Repeat blood culture NGTD.  TEE negative for vegetation but similar very small mobile echodensity seen in RVOT which was better visualized on prior TTE on 03/03/2022.  ID recommends vancomycin with HD for 4 weeks with end date of 07/13/2022.  General surgery planning to take patient to the OR for gallbladder mass later next week.  Hospital course noteworthy of acute metabolic encephalopathy and paroxysmal A-fib with RVR, now resolved.  Subjective: Seen and examined this evening.  Patient and sister at bedside.  Patient was eating dinner.  She states she feels better.  Has no complaints.  She states she talked to his son about surgery and agreed to proceed.  She is not sure about going to SNF after hospitalization.  She is hoping to return home  Objective: Vitals:   06/19/22 1230 06/19/22 1300 06/19/22 1329 06/19/22 1447  BP: (!) 136/91 117/79 (!) 137/93 (!) 152/92  Pulse: 89 98 91 92  Resp: '15 16 19 19  '$ Temp:   99.1 F (37.3 C) 98.9 F (37.2 C)  TempSrc:   Oral Oral  SpO2: 99% 96% 98% 98%  Weight:   58.8 kg   Height:        Examination:  GENERAL: No apparent distress.  Nontoxic. HEENT: MMM.  Vision and hearing grossly intact.  NECK: Supple.   No apparent JVD.  RESP:  No IWOB.  Fair aeration bilaterally. CVS:  RRR. Heart sounds normal.  ABD/GI/GU: BS+. Abd soft, NTND.  MSK/EXT:  Moves extremities. No apparent deformity. No edema.  SKIN: no apparent skin lesion or wound NEURO: Awake and alert. Oriented appropriately.  No apparent focal neuro deficit. PSYCH: Calm. Normal affect.   Procedures:  7/13-TEE.  Microbiology summarized: 7/8-COVID-19 and influenza PCR nonreactive. 7/8-blood culture with MRSA 7/10-catheter tip culture with MRSA. 7/10-repeat blood culture NGTD 7/11-repeat blood culture NGTD  Assessment and plan: Principal Problem:   MRSA bacteremia Active Problems:   Gallbladder mass   Hyperkalemia   Hypertension   ESRD on dialysis ([facility])   Paroxysmal atrial flutter ([facility])   Acute metabolic encephalopathy   Hyponatremia   Hypokalemia   Lower back pain   Pressure injury of skin   Physical deconditioning  MRSA bacteremia: Thought to be from HD cath.  Previous history of MRSA bacteremia and L5-S1 discitis and endocarditis about 6 months ago.  TDC removed on 7/10.  Catheter tip culture with MRSA.  TEE negative for vegetation but but similar very small mobile echodensity seen in RVOT which was better visualized on prior TTE on 03/03/2022.  Repeat blood culture on 7/10 and 7/11 NGTD. -ID recommended vancomycin with HD and total 07/13/2022.   Known history of gallbladder mass-lost to follow-up outpatient. -Surgery planning to take her to the OR the week of 7/17.   ESRD on HD  MWF: TDC removed due to bacteremia.  Using aVF for HD. Anemia of renal disease: Stable Hyponatremia/hyperkalemia/hypokalemia -Per nephrology  Essential hypertension: Normotensive. -Continue Cardizem   Paroxysmal atrial flutter/A-fib with RVR: RVR seems to have resolved.  -Consolidate to Cardizem CD 240 mg daily with short acting Cardizem 30 mg every 6h as needed -Continue Eliquis for anticoagulation   Acute metabolic encephalopathy: Seems  to have resolved.  Oriented x4. -Reorientation and delirium precautions -Minimize sedating medication  Chronic back pain: CT abdomen and pelvis showed degenerative changes.  Pain seems to have resolved. -Continue Tylenol as needed  Physical deconditioning -PT/OT-recommended SNF but patient hopes to return home   Increased nutrient needs Body mass index is 20.3 kg/m. -Consult dietitian  Pressure ulcer stage II at the coccyx. Pressure Injury Coccyx Stage 2 -  Partial thickness loss of dermis presenting as a shallow open injury with a red, pink wound bed without slough. (Active)     Location: Coccyx  Location Orientation:   Staging: Stage 2 -  Partial thickness loss of dermis presenting as a shallow open injury with a red, pink wound bed without slough.  Wound Description (Comments):   Present on Admission: Yes  Dressing Type Foam - Lift dressing to assess site every shift 06/19/22 0742   DVT prophylaxis:  apixaban (ELIQUIS) tablet 2.5 mg Start: 06/16/22 1445 apixaban (ELIQUIS) tablet 2.5 mg  Code Status: Full code Family Communication: Updated patient's sister at bedside Level of care: Telemetry Medical Status is: Inpatient Remains inpatient appropriate because: Due to MRSA bacteremia and gallbladder mass   Final disposition: TBD Consultants:  Cardiology Infectious disease Nephrology Vascular surgery Interventional radiology General surgery  Sch Meds:  Scheduled Meds:  apixaban  2.5 mg Oral BID   Chlorhexidine Gluconate Cloth  6 each Topical Q0600   Chlorhexidine Gluconate Cloth  6 each Topical Q0600   diltiazem  60 mg Oral Q8H   pantoprazole  40 mg Oral BID   sevelamer carbonate  1,600 mg Oral TID WC   Continuous Infusions:  vancomycin Stopped (06/19/22 1250)   PRN Meds:.acetaminophen, acetaminophen, HYDROmorphone (DILAUDID) injection, labetalol, lidocaine HCl (PF), lip balm, mouth rinse, oxyCODONE, prochlorperazine  Antimicrobials: Anti-infectives (From  admission, onward)    Start     Dose/Rate Route Frequency Ordered Stop   06/15/22 1200  vancomycin (VANCOREADY) IVPB 500 mg/100 mL        500 mg 100 mL/hr over 60 Minutes Intravenous Every T-Th-Sa (Hemodialysis) 06/15/22 1028 07/13/22 2359   06/14/22 1200  vancomycin (VANCOREADY) IVPB 500 mg/100 mL        500 mg 100 mL/hr over 60 Minutes Intravenous Every M-W-F (Hemodialysis) 06/14/22 0814 06/14/22 1714   06/13/22 1130  cefTRIAXone (ROCEPHIN) 2 g in sodium chloride 0.9 % 100 mL IVPB  Status:  Discontinued        2 g 200 mL/hr over 30 Minutes Intravenous Every 24 hours 06/13/22 1118 06/13/22 1249   06/13/22 0800  vancomycin (VANCOREADY) IVPB 1250 mg/250 mL        1,250 mg 166.7 mL/hr over 90 Minutes Intravenous  Once 06/13/22 0757 06/13/22 1223   06/13/22 0757  vancomycin variable dose per unstable renal function (pharmacist dosing)  Status:  Discontinued         Does not apply See admin instructions 06/13/22 0757 06/15/22 1029        I have personally reviewed the following labs and images: CBC: Recent Labs  Lab 06/13/22 0444 06/14/22 0113 06/15/22 0243 06/16/22 6734 06/17/22 0107 06/18/22 0149  06/19/22 0911  WBC 7.5 11.9* 7.4 5.3 6.0 5.7 5.8  NEUTROABS 6.3 10.2* 5.9  --   --   --   --   HGB 11.2* 11.6* 10.9* 10.1* 10.1* 10.2* 9.5*  HCT 34.7* 33.3* 32.2* 29.8* 28.9* 30.3* 28.4*  MCV 98.6 94.3 95.3 96.1 92.9 95.3 95.6  PLT 136* 150 155 175 196 214 225   BMP &GFR Recent Labs  Lab 06/13/22 0444 06/14/22 0113 06/17/22 0107 06/18/22 0149 06/19/22 1130  NA 129* 135 129* 133* 126*  K 5.0 3.9 3.8 3.3* 3.9  CL 97* 92* 91* 93* 85*  CO2 14* 18* '25 29 27  '$ GLUCOSE 70 63* 104* 110* 83  BUN 94* 43* 29* 17 28*  CREATININE 12.08* 7.05* 5.48* 3.93* 5.61*  CALCIUM 9.0 9.0 8.9 8.6* 9.1  MG  --   --  1.5* 1.5*  --   PHOS 8.9*  --   --   --  4.1   Estimated Creatinine Clearance: 8.4 mL/min (A) (by C-G formula based on SCr of 5.61 mg/dL (H)). Liver & Pancreas: Recent Labs  Lab  06/13/22 0444 06/19/22 1130  ALBUMIN 2.6* 2.1*   No results for input(s): "LIPASE", "AMYLASE" in the last 168 hours.  No results for input(s): "AMMONIA" in the last 168 hours.  Diabetic: No results for input(s): "HGBA1C" in the last 72 hours. Recent Labs  Lab 06/15/22 2047  GLUCAP 150*   Cardiac Enzymes: No results for input(s): "CKTOTAL", "CKMB", "CKMBINDEX", "TROPONINI" in the last 168 hours. No results for input(s): "PROBNP" in the last 8760 hours. Coagulation Profile: No results for input(s): "INR", "PROTIME" in the last 168 hours.  Thyroid Function Tests: No results for input(s): "TSH", "T4TOTAL", "FREET4", "T3FREE", "THYROIDAB" in the last 72 hours. Lipid Profile: No results for input(s): "CHOL", "HDL", "LDLCALC", "TRIG", "CHOLHDL", "LDLDIRECT" in the last 72 hours. Anemia Panel: No results for input(s): "VITAMINB12", "FOLATE", "FERRITIN", "TIBC", "IRON", "RETICCTPCT" in the last 72 hours. Urine analysis:    Component Value Date/Time   COLORURINE STRAW (A) 06/12/2022 2040   APPEARANCEUR CLEAR 06/12/2022 2040   LABSPEC 1.006 06/12/2022 2040   PHURINE 7.0 06/12/2022 2040   GLUCOSEU NEGATIVE 06/12/2022 2040   HGBUR SMALL (A) 06/12/2022 2040   BILIRUBINUR NEGATIVE 06/12/2022 2040   KETONESUR NEGATIVE 06/12/2022 2040   PROTEINUR 30 (A) 06/12/2022 2040   UROBILINOGEN 0.2 09/27/2021 1329   NITRITE NEGATIVE 06/12/2022 2040   LEUKOCYTESUR NEGATIVE 06/12/2022 2040   Sepsis Labs: Invalid input(s): "PROCALCITONIN", "LACTICIDVEN"  Microbiology: Recent Results (from the past 240 hour(s))  Resp Panel by RT-PCR (Flu A&B, Covid) Anterior Nasal Swab     Status: None   Collection Time: 06/12/22  3:08 PM   Specimen: Anterior Nasal Swab  Result Value Ref Range Status   SARS Coronavirus 2 by RT PCR NEGATIVE NEGATIVE Final    Comment: (NOTE) SARS-CoV-2 target nucleic acids are NOT DETECTED.  The SARS-CoV-2 RNA is generally detectable in upper respiratory specimens during the  acute phase of infection. The lowest concentration of SARS-CoV-2 viral copies this assay can detect is 138 copies/mL. A negative result does not preclude SARS-Cov-2 infection and should not be used as the sole basis for treatment or other patient management decisions. A negative result may occur with  improper specimen collection/handling, submission of specimen other than nasopharyngeal swab, presence of viral mutation(s) within the areas targeted by this assay, and inadequate number of viral copies(<138 copies/mL). A negative result must be combined with clinical observations, patient history, and epidemiological information.  The expected result is Negative.  Fact Sheet for Patients:  EntrepreneurPulse.com.au  Fact Sheet for Healthcare Providers:  IncredibleEmployment.be  This test is no t yet approved or cleared by the [place] FDA and  has been authorized for detection and/or diagnosis of SARS-CoV-2 by FDA under an Emergency Use Authorization (EUA). This EUA will remain  in effect (meaning this test can be used) for the duration of the COVID-19 declaration under Section 564(b)(1) of the Act, 21 U.S.C.section 360bbb-3(b)(1), unless the authorization is terminated  or revoked sooner.       Influenza A by PCR NEGATIVE NEGATIVE Final   Influenza B by PCR NEGATIVE NEGATIVE Final    Comment: (NOTE) The Xpert Xpress SARS-CoV-2/FLU/RSV plus assay is intended as an aid in the diagnosis of influenza from Nasopharyngeal swab specimens and should not be used as a sole basis for treatment. Nasal washings and aspirates are unacceptable for Xpert Xpress SARS-CoV-2/FLU/RSV testing.  Fact Sheet for Patients: EntrepreneurPulse.com.au  Fact Sheet for Healthcare Providers: IncredibleEmployment.be  This test is not yet approved or cleared by the [place] FDA and has been authorized for detection and/or  diagnosis of SARS-CoV-2 by FDA under an Emergency Use Authorization (EUA). This EUA will remain in effect (meaning this test can be used) for the duration of the COVID-19 declaration under Section 564(b)(1) of the Act, 21 U.S.C. section 360bbb-3(b)(1), unless the authorization is terminated or revoked.  Performed at [facility], [place] [address] [place], [place] [address]   Blood Culture (routine x 2)     Status: Abnormal   Collection Time: 06/12/22  3:11 PM   Specimen: BLOOD LEFT HAND  Result Value Ref Range Status   Specimen Description BLOOD LEFT HAND  Final   Special Requests   Final    BOTTLES DRAWN AEROBIC ONLY Blood Culture results may not be optimal due to an inadequate volume of blood received in culture bottles   Culture  Setup Time   Final    GRAM POSITIVE COCCI IN CLUSTERS AEROBIC BOTTLE ONLY CRITICAL VALUE NOTED.  VALUE IS CONSISTENT WITH PREVIOUSLY REPORTED AND CALLED VALUE.    Culture (A)  Final    STAPHYLOCOCCUS AUREUS SUSCEPTIBILITIES PERFORMED ON PREVIOUS CULTURE WITHIN THE LAST 5 DAYS. Performed at [facility], [place] [address], [place], [place] [address]    Report Status 06/15/2022 FINAL  Final  Blood Culture (routine x 2)     Status: Abnormal   Collection Time: 06/12/22  3:45 PM   Specimen: BLOOD  Result Value Ref Range Status   Specimen Description BLOOD SITE NOT SPECIFIED  Final   Special Requests   Final    BOTTLES DRAWN AEROBIC AND ANAEROBIC Blood Culture results may not be optimal due to an inadequate volume of blood received in culture bottles   Culture  Setup Time   Final    GRAM POSITIVE COCCI IN BOTH AEROBIC AND ANAEROBIC BOTTLES CRITICAL RESULT CALLED TO, READ BACK BY AND VERIFIED WITH: PHARMD M.MACCIA AT 6433 ON 06/13/2022 BY T.SAAD. Performed at [facility], [place] [address], [place], [place] [address]    Culture METHICILLIN RESISTANT STAPHYLOCOCCUS AUREUS (A)  Final   Report Status 06/15/2022 FINAL  Final   Organism ID,  Bacteria METHICILLIN RESISTANT STAPHYLOCOCCUS AUREUS  Final      Susceptibility   Methicillin resistant staphylococcus aureus - MIC*    CIPROFLOXACIN <=0.5 SENSITIVE Sensitive     ERYTHROMYCIN <=0.25 SENSITIVE Sensitive     GENTAMICIN <=0.5 SENSITIVE  Sensitive     OXACILLIN >=4 RESISTANT Resistant     TETRACYCLINE >=16 RESISTANT Resistant     VANCOMYCIN 1 SENSITIVE Sensitive     TRIMETH/SULFA <=10 SENSITIVE Sensitive     CLINDAMYCIN <=0.25 SENSITIVE Sensitive     RIFAMPIN <=0.5 SENSITIVE Sensitive     Inducible Clindamycin NEGATIVE Sensitive     * METHICILLIN RESISTANT STAPHYLOCOCCUS AUREUS  Blood Culture ID Panel (Reflexed)     Status: Abnormal   Collection Time: 06/12/22  3:45 PM  Result Value Ref Range Status   Enterococcus faecalis NOT DETECTED NOT DETECTED Final   Enterococcus Faecium NOT DETECTED NOT DETECTED Final   Listeria monocytogenes NOT DETECTED NOT DETECTED Final   Staphylococcus species DETECTED (A) NOT DETECTED Final    Comment: CRITICAL RESULT CALLED TO, READ BACK BY AND VERIFIED WITH: PHARMD M.MACCIA AT 0750 ON 06/13/2022 BY T.SAAD.    Staphylococcus aureus (BCID) DETECTED (A) NOT DETECTED Final    Comment: Methicillin (oxacillin)-resistant Staphylococcus aureus (MRSA). MRSA is predictably resistant to beta-lactam antibiotics (except ceftaroline). Preferred therapy is vancomycin unless clinically contraindicated. Patient requires contact precautions if  hospitalized. CRITICAL RESULT CALLED TO, READ BACK BY AND VERIFIED WITH: PHARMD M.MACCIA AT 0750 ON 06/13/2022 BY T.SAAD.    Staphylococcus epidermidis NOT DETECTED NOT DETECTED Final   Staphylococcus lugdunensis NOT DETECTED NOT DETECTED Final   Streptococcus species NOT DETECTED NOT DETECTED Final   Streptococcus agalactiae NOT DETECTED NOT DETECTED Final   Streptococcus pneumoniae NOT DETECTED NOT DETECTED Final   Streptococcus pyogenes NOT DETECTED NOT DETECTED Final   A.calcoaceticus-baumannii NOT DETECTED  NOT DETECTED Final   Bacteroides fragilis NOT DETECTED NOT DETECTED Final   Enterobacterales NOT DETECTED NOT DETECTED Final   Enterobacter cloacae complex NOT DETECTED NOT DETECTED Final   Escherichia coli NOT DETECTED NOT DETECTED Final   Klebsiella aerogenes NOT DETECTED NOT DETECTED Final   Klebsiella oxytoca NOT DETECTED NOT DETECTED Final   Klebsiella pneumoniae NOT DETECTED NOT DETECTED Final   Proteus species NOT DETECTED NOT DETECTED Final   Salmonella species NOT DETECTED NOT DETECTED Final   Serratia marcescens NOT DETECTED NOT DETECTED Final   Haemophilus influenzae NOT DETECTED NOT DETECTED Final   Neisseria meningitidis NOT DETECTED NOT DETECTED Final   Pseudomonas aeruginosa NOT DETECTED NOT DETECTED Final   Stenotrophomonas maltophilia NOT DETECTED NOT DETECTED Final   Candida albicans NOT DETECTED NOT DETECTED Final   Candida auris NOT DETECTED NOT DETECTED Final   Candida glabrata NOT DETECTED NOT DETECTED Final   Candida krusei NOT DETECTED NOT DETECTED Final   Candida parapsilosis NOT DETECTED NOT DETECTED Final   Candida tropicalis NOT DETECTED NOT DETECTED Final   Cryptococcus neoformans/gattii NOT DETECTED NOT DETECTED Final   Meth resistant mecA/C and MREJ DETECTED (A) NOT DETECTED Final    Comment: CRITICAL RESULT CALLED TO, READ BACK BY AND VERIFIED WITH: PHARMD M.MACCIA AT 0750 ON 06/13/2022 BY T.SAAD. Performed at [facility], [place] [address], [place], [place] [address]   Culture, blood (Routine X 2) w Reflex to ID Panel     Status: None   Collection Time: 06/14/22  1:08 AM   Specimen: BLOOD  Result Value Ref Range Status   Specimen Description BLOOD RIGHT ANTECUBITAL  Final   Special Requests   Final    BOTTLES DRAWN AEROBIC ONLY Blood Culture adequate volume   Culture   Final    NO GROWTH 5 DAYS Performed at [facility], [place] [address], [place],  [address]  Report Status 06/19/2022 FINAL  Final  Culture, blood (Routine X 2) w  Reflex to ID Panel     Status: None   Collection Time: 06/14/22  1:13 AM   Specimen: BLOOD RIGHT HAND  Result Value Ref Range Status   Specimen Description BLOOD RIGHT HAND  Final   Special Requests   Final    BOTTLES DRAWN AEROBIC AND ANAEROBIC Blood Culture adequate volume   Culture   Final    NO GROWTH 5 DAYS Performed at [facility], [place] [address] [place], [place] [address]    Report Status 06/19/2022 FINAL  Final  Cath Tip Culture     Status: Abnormal   Collection Time: 06/14/22  3:23 PM   Specimen: Catheter Tip; Other  Result Value Ref Range Status   Specimen Description CATH TIP  Final   Special Requests   Final    NONE Performed at [facility], [place] [address], [place], [place] [address]    Culture (A)  Final    20,000 COLONIES/mL METHICILLIN RESISTANT STAPHYLOCOCCUS AUREUS   Report Status 06/17/2022 FINAL  Final   Organism ID, Bacteria METHICILLIN RESISTANT STAPHYLOCOCCUS AUREUS (A)  Final      Susceptibility   Methicillin resistant staphylococcus aureus - MIC*    CIPROFLOXACIN <=0.5 SENSITIVE Sensitive     ERYTHROMYCIN <=0.25 SENSITIVE Sensitive     GENTAMICIN <=0.5 SENSITIVE Sensitive     OXACILLIN RESISTANT Resistant     TETRACYCLINE >=16 RESISTANT Resistant     VANCOMYCIN 1 SENSITIVE Sensitive     TRIMETH/SULFA <=10 SENSITIVE Sensitive     CLINDAMYCIN <=0.25 SENSITIVE Sensitive     RIFAMPIN <=0.5 SENSITIVE Sensitive     Inducible Clindamycin NEGATIVE Sensitive     * 20,000 COLONIES/mL METHICILLIN RESISTANT STAPHYLOCOCCUS AUREUS  MRSA Next Gen by PCR, Nasal     Status: Abnormal   Collection Time: 06/15/22  8:17 AM   Specimen: Nasal Mucosa; Nasal Swab  Result Value Ref Range Status   MRSA by PCR Next Gen DETECTED (A) NOT DETECTED Final    Comment: RESULT CALLED TO, READ BACK BY AND VERIFIED WITH: RN Nathanial Rancher [id number] AT 1203 BY EC (NOTE) The GeneXpert MRSA Assay (FDA approved for NASAL specimens only), is one component of a  comprehensive MRSA colonization surveillance program. It is not intended to diagnose MRSA infection nor to guide or monitor treatment for MRSA infections. Test performance is not FDA approved in patients less than 60 years old. Performed at [facility], [place] [address] [place], [place] [address]   Culture, blood (Routine X 2) w Reflex to ID Panel     Status: None (Preliminary result)   Collection Time: 06/15/22 10:20 AM   Specimen: BLOOD  Result Value Ref Range Status   Specimen Description BLOOD BLOOD RIGHT HAND  Final   Special Requests AEROBIC BOTTLE ONLY Blood Culture adequate volume  Final   Culture   Final    NO GROWTH 4 DAYS Performed at [facility], [place] [address], [place], [place] [address]    Report Status PENDING  Incomplete  Culture, blood (Routine X 2) w Reflex to ID Panel     Status: None (Preliminary result)   Collection Time: 06/15/22 10:21 AM   Specimen: BLOOD  Result Value Ref Range Status   Specimen Description BLOOD BLOOD RIGHT WRIST  Final   Special Requests AEROBIC BOTTLE ONLY Blood Culture adequate volume  Final   Culture   Final  NO GROWTH 4 DAYS Performed at [facility], [place] [address] [place], [place] [address]    Report Status PENDING  Incomplete    Radiology Studies: No results found.    Patrisha Hausmann T. [place]  If 7PM-7AM, please contact night-coverage [email or web address] 06/19/2022, 6:15 PM

## 2022-06-19 NOTE — Progress Notes (Addendum)
Subjective: Seen on hemodialysis reports feeling nauseous, she tells me she is now ready to have her gallbladder removed as requested by surgery team.  Objective Vital signs in last 24 hours: Vitals:   06/19/22 0000 06/19/22 0400 06/19/22 0734 06/19/22 0920  BP: 134/89 138/86 (!) 137/93 (!) 145/85  Pulse: 98 92 92 90  Resp: '15 17 15 13  '$ Temp: 98.6 F (37 C) 98.4 F (36.9 C) 98.4 F (36.9 C) 99 F (37.2 C)  TempSrc: Oral Oral Oral Oral  SpO2: 98% 98% 99% 99%  Weight:    59.8 kg  Height:       Weight change:   Physical Exam: General: Alert, chronically ill feeling elderly female NAD Heart: RRR no MRG Lungs: CTA bilateral Abdomen: NABS, soft, NTND no ascites Extremities: No pedal edema Dialysis Access: Left upper arm AV fistula currently being cannulated  Home meds: apixaban 2.5 bid, metoprolol 25 bid, pantoprazole, sevelamer carbonate 2 ac tid   OP HD: [facility] TTS 4h  60.3kg  400/600  2/2 bath  LUE AVF (TDC removed here) Hep none - mircera 150 q2, last 6/24 - hectorol 4 ug iv tts     Problem/Plan: MRSA Bacteremia - H/o MRSA bacteremia/endocarditis. IV Vancomycin per primary./ID =" end date [facility] 07/13/22" had negative TEE/ TDC removed on 7/10 by [facility]. Using AVF now.  Gallbladder mass - gen surg team consulting, per notes =concern for GB adenoCa  recommending GB removal patient initially hesitant now today she tells me she is ready removal "as soon as possible.'  Plans per pmd/ gen surg. AMS -now resolved, probably related to infection, no acute findings on head CT.  ESRD -  HD TTS.  On schedule HD today  Volume  - BPs good, no sig vol overload. Keep even w/ HD.  Anemia  - Hgb 10.2-  No ESA needs.  Metabolic bone disease -  Phos high 8.9 noted on heart healthy diet change to renal diet and continue Renvela binders.  HTN - on diltiazem, bp's good Hx atrial flutter - getting diltiazem 60 tid here  Ernest Haber, PA-C [facility] [phone] 06/19/2022,9:35  AM  LOS: 7 days   Labs: Basic Metabolic Panel: Recent Labs  Lab 06/13/22 0444 06/14/22 0113 06/17/22 0107 06/18/22 0149  NA 129* 135 129* 133*  K 5.0 3.9 3.8 3.3*  CL 97* 92* 91* 93*  CO2 14* 18* 25 29  GLUCOSE 70 63* 104* 110*  BUN 94* 43* 29* 17  CREATININE 12.08* 7.05* 5.48* 3.93*  CALCIUM 9.0 9.0 8.9 8.6*  PHOS 8.9*  --   --   --    Liver Function Tests: Recent Labs  Lab 06/12/22 1545 06/13/22 0444  AST 16  --   ALT 10  --   ALKPHOS 56  --   BILITOT 1.1  --   PROT 7.0  --   ALBUMIN 3.0* 2.6*   Recent Labs  Lab 06/12/22 1545  LIPASE 52*   Recent Labs  Lab 06/12/22 1545  AMMONIA 40*   CBC: Recent Labs  Lab 06/13/22 0444 06/14/22 0113 06/15/22 0243 06/16/22 0702 06/17/22 0107 06/18/22 0149  WBC 7.5 11.9* 7.4 5.3 6.0 5.7  NEUTROABS 6.3 10.2* 5.9  --   --   --   HGB 11.2* 11.6* 10.9* 10.1* 10.1* 10.2*  HCT 34.7* 33.3* 32.2* 29.8* 28.9* 30.3*  MCV 98.6 94.3 95.3 96.1 92.9 95.3  PLT 136* 150 155 175 196 214   Cardiac Enzymes: No results for input(s): "  CKTOTAL", "CKMB", "CKMBINDEX", "TROPONINI" in the last 168 hours. CBG: Recent Labs  Lab 06/12/22 1631 06/15/22 2047  GLUCAP 100* 150*    Studies/Results: No results found. Medications:  vancomycin Stopped (06/17/22 1653)    apixaban  2.5 mg Oral BID   Chlorhexidine Gluconate Cloth  6 each Topical Q0600   Chlorhexidine Gluconate Cloth  6 each Topical Q0600   diltiazem  60 mg Oral Q8H   pantoprazole  40 mg Oral BID   sevelamer carbonate  1,600 mg Oral TID WC

## 2022-06-19 NOTE — Progress Notes (Signed)
Pharmacy Antibiotic Note  Diamond [name] is a 71 y.o. female admitted on 06/12/2022 with MRSA bacteremia.  Pharmacy has been consulted for vancomycin dosing. Patient got loading dose of vancomycin on 7/9. Patient is s/p TDC removal on 7/10. Patient is on HD schedule every Tuesday, Thursday, Saturday.   Pre-HD vancomycin random within goal range.  Plan: Continue Vancomycin 500 mg qHD-TTS through 07/13/2022 Monitor clinical progress, length of therapy, cultures  Assess pre-HD vanc levels per protocol    Height: '5\' 7"'$  (170.2 cm) Weight: 58.8 kg (129 lb 10.1 oz) IBW/kg (Calculated) : 61.6  Temp (24hrs), Avg:98.6 F (37 C), Min:98.2 F (36.8 C), Max:99.1 F (37.3 C)  Recent Labs  Lab 06/12/22 1545 06/12/22 1640 06/12/22 1649 06/13/22 0444 06/14/22 0113 06/15/22 0243 06/16/22 0702 06/17/22 0107 06/18/22 0149 06/19/22 0116 06/19/22 0911 06/19/22 1130  WBC 8.6  --   --  7.5 11.9* 7.4 5.3 6.0 5.7  --  5.8  --   CREATININE 11.73*  --    < > 12.08* 7.05*  --   --  5.48* 3.93*  --   --  5.61*  LATICACIDVEN 1.0 1.5  --   --  1.0  --   --   --   --   --   --   --   VANCORANDOM  --   --   --   --   --   --   --   --   --  15  --   --    < > = values in this interval not displayed.     Estimated Creatinine Clearance: 8.4 mL/min (A) (by C-G formula based on SCr of 5.61 mg/dL (H)).    Allergies  Allergen Reactions   Shellfish Allergy     Unknown reaction     Antimicrobials this admission: Ceftriaxone 7/8 x1 Vancomycin 7/8 >>   Microbiology results: 7/8 Bcx 2/2: MRSA 7/10 Bcx: ngtd 7/10 Cath tip: 20K col/ml MRSA 7/11 Bcx: ngtd 7/11 MRSA PCR: positie  Jeneen Rinks, Pharm.D PGY1 Pharmacy Resident  06/19/2022 2:45 PM

## 2022-06-19 NOTE — Plan of Care (Signed)

## 2022-06-20 DIAGNOSIS — B9562 Methicillin resistant Staphylococcus aureus infection as the cause of diseases classified elsewhere: Secondary | ICD-10-CM | POA: Diagnosis not present

## 2022-06-20 DIAGNOSIS — N186 End stage renal disease: Secondary | ICD-10-CM | POA: Diagnosis not present

## 2022-06-20 DIAGNOSIS — K828 Other specified diseases of gallbladder: Secondary | ICD-10-CM | POA: Diagnosis not present

## 2022-06-20 DIAGNOSIS — R7881 Bacteremia: Secondary | ICD-10-CM | POA: Diagnosis not present

## 2022-06-20 LAB — CULTURE, BLOOD (ROUTINE X 2)
Culture: NO GROWTH
Culture: NO GROWTH
Special Requests: ADEQUATE
Special Requests: ADEQUATE

## 2022-06-20 MED ORDER — DARBEPOETIN ALFA 150 MCG/0.3ML IJ SOSY
150.0000 ug | PREFILLED_SYRINGE | INTRAMUSCULAR | Status: DC
  Administered 2022-06-22 – 2022-06-29 (×2): 150 ug via INTRAVENOUS
  Filled 2022-06-20 (×4): qty 0.3

## 2022-06-20 NOTE — Evaluation (Signed)
Occupational Therapy Evaluation Patient Details Name: Diamond [name] MRN: [id number] DOB: [birth year] Today's Date: 06/20/2022   History of Present Illness Pt is a 71 y/o female admitted secondary to abdominal pain, vomiting and missed HD. Generalized abdominal pain suspect related to gallbladder mass seen on CT scan. Pt also found to have MRSA and acute metabolic encephalopathy. PMH including but not limited to ESRD, HTN and a-flutter. Planning for TEE on 7/13.   Clinical Impression   Pt independent at baseline with ADLs and uses cane/RW for functional mobility. Lives alone and has family nearby who can assist at d/c. Pt currently min-max A+2 for ADLs, mod-max A+2 for bed mobility, and max A+2 for standing attempts. Pt able to stand x2, unable to take steps in place to progress to chair. Pt limited by increased pain in R side throughout session. Pt presenting with impairments listed below, will follow acutely. Recommend SNF at d/c.     Recommendations for follow up therapy are one component of a multi-disciplinary discharge planning process, led by the attending physician.  Recommendations may be updated based on patient status, additional functional criteria and insurance authorization.   Follow Up Recommendations  Skilled nursing-short term rehab (<3 hours/day)    Assistance Recommended at Discharge Frequent or constant Supervision/Assistance  Patient can return home with the following Assistance with cooking/housework;Direct supervision/assist for medications management;Direct supervision/assist for financial management;Help with stairs or ramp for entrance;Assist for transportation;A lot of help with walking and/or transfers;A lot of help with bathing/dressing/bathroom    Functional Status Assessment  Patient has had a recent decline in their functional status and demonstrates the ability to make significant improvements in function in a reasonable and predictable amount of time.   Equipment Recommendations  None recommended by OT;Other (comment) (defer to next venue of care)    Recommendations for Other Services PT consult     Precautions / Restrictions Precautions Precautions: Fall Precaution Comments: reported 2 recent falls Restrictions Weight Bearing Restrictions: No      Mobility Bed Mobility Overal bed mobility: Needs Assistance Bed Mobility: Rolling, Sidelying to Sit, Sit to Sidelying Rolling: Max assist Sidelying to sit: Mod assist     Sit to sidelying: Max assist, +2 for physical assistance General bed mobility comments: max A +2 to return to and scoot up in bed    Transfers Overall transfer level: Needs assistance Equipment used: Rolling walker (2 wheels), 2 person hand held assist Transfers: Sit to/from Stand Sit to Stand: Max assist, +2 physical assistance           General transfer comment: unable to take steps in place, max cues for sequencing/pushing from RW and bed      Balance Overall balance assessment: Needs assistance Sitting-balance support: Feet supported Sitting balance-Leahy Scale: Fair Sitting balance - Comments: cannot reach outside BOS without LOB   Standing balance support: During functional activity, Reliant on assistive device for balance Standing balance-Leahy Scale: Zero Standing balance comment: reliant on external support                           ADL either performed or assessed with clinical judgement   ADL Overall ADL's : Needs assistance/impaired Eating/Feeding: Minimal assistance   Grooming: Minimal assistance   Upper Body Bathing: Moderate assistance   Lower Body Bathing: Maximal assistance   Upper Body Dressing : Moderate assistance   Lower Body Dressing: Maximal assistance   Toilet Transfer: Maximal assistance;+2 for physical assistance  Toileting- Clothing Manipulation and Hygiene: Maximal assistance       Functional mobility during ADLs: Maximal assistance;+2 for  physical assistance       Vision Baseline Vision/History: 1 Wears glasses Vision Assessment?: No apparent visual deficits     Perception     Praxis      Pertinent Vitals/Pain Pain Assessment Pain Assessment: Faces Pain Score: 9  Faces Pain Scale: Hurts whole lot Pain Location: lower R side of back Pain Descriptors / Indicators: Grimacing, Guarding Pain Intervention(s): Limited activity within patient's tolerance, Monitored during session, Repositioned, Premedicated before session     Hand Dominance Right   Extremity/Trunk Assessment Upper Extremity Assessment Upper Extremity Assessment: Generalized weakness   Lower Extremity Assessment Lower Extremity Assessment: Defer to PT evaluation   Cervical / Trunk Assessment Cervical / Trunk Assessment: Kyphotic   Communication Communication Communication: No difficulties   Cognition Arousal/Alertness: Awake/alert Behavior During Therapy: Flat affect Overall Cognitive Status: Impaired/Different from baseline                       Memory: Decreased short-term memory Following Commands: Follows one step commands with increased time Safety/Judgement: Decreased awareness of deficits, Decreased awareness of safety (pt wanting to get to chair, unaware of need to stand in order to transfer to chair) Awareness: Anticipatory (verbalizes need to use bathroom before mobility) Problem Solving: Slow processing, Decreased initiation, Difficulty sequencing, Requires verbal cues, Requires tactile cues General Comments: pt with decreased alertness/ communication with sitting upright EOB during session, reporting increased pain to R side     General Comments  VSS on RA    Exercises     Shoulder Instructions      Home Living Family/patient expects to be discharged to:: Private residence Living Arrangements: Alone Available Help at Discharge: Family;Available 24 hours/day Type of Home: House Home Access: Stairs to  enter [employer] of Steps: 8 Entrance Stairs-Rails: Right;Left Home Layout: One level     Bathroom Shower/Tub: Occupational psychologist: Standard     Home Equipment: Cane - single Barista (2 wheels);Rollator (4 wheels)          Prior Functioning/Environment Prior Level of Function : Needs assist             Mobility Comments: walks with a cane or walker depending on the day and how she feels ADLs Comments: reports she is independent        OT Problem List: Decreased strength;Decreased range of motion;Impaired balance (sitting and/or standing);Decreased activity tolerance;Decreased safety awareness;Decreased cognition      OT Treatment/Interventions: Self-care/ADL training;Therapeutic exercise;DME and/or AE instruction;Energy conservation;Therapeutic activities;Patient/family education;Balance training    OT Goals(Current goals can be found in the care plan section) Acute Rehab OT Goals Patient Stated Goal: none stated OT Goal Formulation: With patient Time For Goal Achievement: 07/04/22 Potential to Achieve Goals: Good  OT Frequency: Min 2X/week    Co-evaluation              AM-PAC OT "6 Clicks" Daily Activity     Outcome Measure Help from another person eating meals?: A Little Help from another person taking care of personal grooming?: A Little Help from another person toileting, which includes using toliet, bedpan, or urinal?: A Lot Help from another person bathing (including washing, rinsing, drying)?: A Lot Help from another person to put on and taking off regular upper body clothing?: A Lot Help from another person to put on and taking off regular lower  body clothing?: Total 6 Click Score: 13   End of Session Equipment Utilized During Treatment: Gait belt;Rolling walker (2 wheels) Nurse Communication: Mobility status  Activity Tolerance: Patient limited by pain Patient left: in bed;with call bell/phone within  reach;with bed alarm set (bed in chair position)  OT Visit Diagnosis: Unsteadiness on feet (R26.81);Other abnormalities of gait and mobility (R26.89);Muscle weakness (generalized) (M62.81);Other symptoms and signs involving cognitive function                Time: 3241-9914 OT Time Calculation (min): 36 min Charges:  OT General Charges $OT Visit: 1 Visit OT Evaluation $OT Eval Moderate Complexity: 1 Mod OT Treatments $Self Care/Home Management : 8-22 mins  Lynnda Child, OTD, OTR/L Acute Rehab [phone]) [phone] - [place] 06/20/2022, 10:11 AM

## 2022-06-20 NOTE — Progress Notes (Signed)
Subjective: Tolerated dialysis yesterday and use of AV fistula.  She states she is awaiting time for gallbladder surgery  Objective Vital signs in last 24 hours: Vitals:   06/20/22 0454 06/20/22 0500 06/20/22 0828 06/20/22 0831  BP: (!) 143/94  (!) 115/93   Pulse: 82     Resp: 18   12  Temp: 97.8 F (36.6 C)     TempSrc: Oral     SpO2: 96%   100%  Weight:  59.4 kg    Height:       Weight change:   Physical Exam: General: Alert, chronically ill feeling elderly female NAD Heart: RRR no MRG Lungs: CTA bilateral Abdomen: NABS, soft, NTND no ascites Extremities: No pedal edema Dialysis Access: Left upper arm AVF+ bruit   Home meds: apixaban 2.5 bid, metoprolol 25 bid, pantoprazole, sevelamer carbonate 2 ac tid    OP HD: [place] TTS 4h  60.3kg  400/600  2/2 bath  LUE AVF (TDC removed here) Hep none - mircera 150 q2, last 6/24 - hectorol 4 ug iv tts     Problem/Plan: MRSA Bacteremia - H/o MRSA bacteremia/endocarditis. IV Vancomycin per primary./ID =" end date Vanco 07/13/22" had negative TEE/ TDC removed on 7/10 by [facility]. Using AVF now.  Gallbladder mass - gen surg team consulting, per notes =concern for GB adenoCa  recommending GB removal patient initially hesitant now she is ready removal "as soon as possible.'  Plans per pmd/ gen surg. AMS -now resolved, probably related to infection, no acute findings on head CT.  ESRD -  HD TTS.  On schedule   Volume  - BPs good, no sig vol overload. Keep even w/ HD.  Anemia  - Hgb 9.5 < 10.2 we will continue ESA =Aranesp 150 CG next HD 7/18 in hospital Metabolic bone disease -  Phos high 8.9 proved to 4.1 , was noted on heart healthy diet change to renal diet and continue Renvela binders.  HTN - on diltiazem, bp's good Hx atrial flutter - getting diltiazem 60 tid here  Ernest Haber, PA-C [facility] Beeper [phone] 06/20/2022,1:40 PM  LOS: 8 days   Labs: Basic Metabolic Panel: Recent Labs  Lab 06/17/22 0107  06/18/22 0149 06/19/22 1130  NA 129* 133* 126*  K 3.8 3.3* 3.9  CL 91* 93* 85*  CO2 '25 29 27  '$ GLUCOSE 104* 110* 83  BUN 29* 17 28*  CREATININE 5.48* 3.93* 5.61*  CALCIUM 8.9 8.6* 9.1  PHOS  --   --  4.1   Liver Function Tests: Recent Labs  Lab 06/19/22 1130  ALBUMIN 2.1*   No results for input(s): "LIPASE", "AMYLASE" in the last 168 hours. No results for input(s): "AMMONIA" in the last 168 hours. CBC: Recent Labs  Lab 06/14/22 0113 06/15/22 0243 06/16/22 0702 06/17/22 0107 06/18/22 0149 06/19/22 0911  WBC 11.9* 7.4 5.3 6.0 5.7 5.8  NEUTROABS 10.2* 5.9  --   --   --   --   HGB 11.6* 10.9* 10.1* 10.1* 10.2* 9.5*  HCT 33.3* 32.2* 29.8* 28.9* 30.3* 28.4*  MCV 94.3 95.3 96.1 92.9 95.3 95.6  PLT 150 155 175 196 214 225   Cardiac Enzymes: No results for input(s): "CKTOTAL", "CKMB", "CKMBINDEX", "TROPONINI" in the last 168 hours. CBG: Recent Labs  Lab 06/15/22 2047  GLUCAP 150*    Studies/Results: No results found. Medications:  vancomycin Stopped (06/19/22 1250)    apixaban  2.5 mg Oral BID   Chlorhexidine Gluconate Cloth  6 each Topical Q0600  Chlorhexidine Gluconate Cloth  6 each Topical Q0600   diltiazem  240 mg Oral Daily   pantoprazole  40 mg Oral BID   sevelamer carbonate  1,600 mg Oral TID WC

## 2022-06-20 NOTE — Progress Notes (Signed)
PROGRESS NOTE  Diamond [name] SUP:[id number] DOB: [birth year]   PCP: Diamond Mccreedy, MD  Patient is from: Home.  Lives alone.  Uses walker at baseline.  DOA: 06/12/2022 LOS: 8  Chief complaints Nausea, vomiting and diarrhea    Brief Narrative / Interim history: 71 year old F with PMH of ESRD on HD TTS, prior MRSA bacteremia with L5-S1 discitis and endocarditis, gallbladder mass, ACD, HTN and paroxysmal A-fib presenting with nausea, vomiting and diarrhea, and found to have MRSA bacteremia likely from dialysis catheter.  CT abdomen and pelvis also showed previously known gallbladder mass suspicious for cholangiocarcinoma.  Patient was started on IV vancomycin.  ID, nephrology and general surgery consulted.   Dialysis catheter removed.  Repeat blood culture NGTD.  TEE negative for vegetation but similar very small mobile echodensity seen in RVOT which was better visualized on prior TTE on 03/03/2022.  ID recommends vancomycin with HD for 4 weeks with end date of 07/13/2022.  General surgery planning to take patient to the OR for gallbladder mass later next week.  Hospital course noteworthy of acute metabolic encephalopathy and paroxysmal A-fib with RVR, now resolved.  Subjective: Was seen and examined, she complains of abdominal pain right upper quadrant area, she reports it has been going on for last 2 weeks.    Objective: Vitals:   06/20/22 0454 06/20/22 0500 06/20/22 0828 06/20/22 0831  BP: (!) 143/94  (!) 115/93   Pulse: 82     Resp: 18   12  Temp: 97.8 F (36.6 C)     TempSrc: Oral     SpO2: 96%   100%  Weight:  59.4 kg    Height:        Examination:  Awake Alert, frail. Symmetrical Chest wall movement, Good air movement bilaterally, CTAB RRR,No Gallops,Rubs or new Murmurs, No Parasternal Heave +ve B.Sounds, Abd Soft, No tenderness, No rebound - guarding or rigidity. No Cyanosis, Clubbing or edema, No new Rash or bruise     Procedures:  7/13-TEE.  Microbiology  summarized: 7/8-COVID-19 and influenza PCR nonreactive. 7/8-blood culture with MRSA 7/10-catheter tip culture with MRSA. 7/10-repeat blood culture NGTD 7/11-repeat blood culture NGTD  Assessment and plan: Principal Problem:   MRSA bacteremia Active Problems:   Gallbladder mass   Hyperkalemia   Hypertension   ESRD on dialysis ([place])   Paroxysmal atrial flutter ([facility])   Acute metabolic encephalopathy   Hyponatremia   Hypokalemia   Lower back pain   Pressure injury of skin   Physical deconditioning  MRSA bacteremia: Thought to be from HD cath.  Previous history of MRSA bacteremia and L5-S1 discitis and endocarditis about 6 months ago.  TDC removed on 7/10.  Catheter tip culture with MRSA.  TEE negative for vegetation but but similar very small mobile echodensity seen in RVOT which was better visualized on prior TTE on 03/03/2022.  Repeat blood culture on 7/10 and 7/11 NGTD. -ID recommended vancomycin with HD and total 07/13/2022.   Known history of gallbladder mass-lost to follow-up outpatient. -Management per general surgery, plan to take the OR this admission, timing per general surgery.  ESRD on HD MWF: TDC removed due to bacteremia.  Using aVF for HD. Anemia of renal disease: Stable Hyponatremia/hyperkalemia/hypokalemia -Per nephrology  Essential hypertension: Normotensive. -Continue Cardizem   Paroxysmal atrial flutter/A-fib with RVR: RVR seems to have resolved.  -Consolidate to Cardizem CD 240 mg daily with short acting Cardizem 30 mg every 6h as needed -Continue Eliquis for anticoagulation   Acute metabolic encephalopathy: Seems to  have resolved.  Oriented x4. -Reorientation and delirium precautions -Minimize sedating medication  Chronic back pain: CT abdomen and pelvis showed degenerative changes.  Pain seems to have resolved. -Continue Tylenol as needed  Physical deconditioning -PT/OT-recommended SNF but patient hopes to return home   Increased nutrient needs Body  mass index is 20.51 kg/m. -Consult dietitian  Pressure ulcer stage II at the coccyx. Pressure Injury Coccyx Stage 2 -  Partial thickness loss of dermis presenting as a shallow open injury with a red, pink wound bed without slough. (Active)     Location: Coccyx  Location Orientation:   Staging: Stage 2 -  Partial thickness loss of dermis presenting as a shallow open injury with a red, pink wound bed without slough.  Wound Description (Comments):   Present on Admission: Yes  Dressing Type Foam - Lift dressing to assess site every shift 06/19/22 1902   DVT prophylaxis:  apixaban (ELIQUIS) tablet 2.5 mg Start: 06/16/22 1445 apixaban (ELIQUIS) tablet 2.5 mg  Code Status: Full code Family Communication: None at bedside Level of care: Telemetry Medical Status is: Inpatient Remains inpatient appropriate because: Due to MRSA bacteremia and gallbladder mass   Final disposition: TBD Consultants:  Cardiology Infectious disease Nephrology Vascular surgery Interventional radiology General surgery  Sch Meds:  Scheduled Meds:  apixaban  2.5 mg Oral BID   Chlorhexidine Gluconate Cloth  6 each Topical Q0600   Chlorhexidine Gluconate Cloth  6 each Topical Q0600   diltiazem  240 mg Oral Daily   pantoprazole  40 mg Oral BID   sevelamer carbonate  1,600 mg Oral TID WC   Continuous Infusions:  vancomycin Stopped (06/19/22 1250)   PRN Meds:.acetaminophen, acetaminophen, diltiazem, HYDROmorphone (DILAUDID) injection, labetalol, lidocaine HCl (PF), lip balm, mouth rinse, oxyCODONE, prochlorperazine  Antimicrobials: Anti-infectives (From admission, onward)    Start     Dose/Rate Route Frequency Ordered Stop   06/15/22 1200  vancomycin (VANCOREADY) IVPB 500 mg/100 mL        500 mg 100 mL/hr over 60 Minutes Intravenous Every T-Th-Sa (Hemodialysis) 06/15/22 1028 07/13/22 2359   06/14/22 1200  vancomycin (VANCOREADY) IVPB 500 mg/100 mL        500 mg 100 mL/hr over 60 Minutes Intravenous  Every M-W-F (Hemodialysis) 06/14/22 0814 06/14/22 1714   06/13/22 1130  cefTRIAXone (ROCEPHIN) 2 g in sodium chloride 0.9 % 100 mL IVPB  Status:  Discontinued        2 g 200 mL/hr over 30 Minutes Intravenous Every 24 hours 06/13/22 1118 06/13/22 1249   06/13/22 0800  vancomycin (VANCOREADY) IVPB 1250 mg/250 mL        1,250 mg 166.7 mL/hr over 90 Minutes Intravenous  Once 06/13/22 0757 06/13/22 1223   06/13/22 0757  vancomycin variable dose per unstable renal function (pharmacist dosing)  Status:  Discontinued         Does not apply See admin instructions 06/13/22 0757 06/15/22 1029        I have personally reviewed the following labs and images: CBC: Recent Labs  Lab 06/14/22 0113 06/15/22 0243 06/16/22 0702 06/17/22 0107 06/18/22 0149 06/19/22 0911  WBC 11.9* 7.4 5.3 6.0 5.7 5.8  NEUTROABS 10.2* 5.9  --   --   --   --   HGB 11.6* 10.9* 10.1* 10.1* 10.2* 9.5*  HCT 33.3* 32.2* 29.8* 28.9* 30.3* 28.4*  MCV 94.3 95.3 96.1 92.9 95.3 95.6  PLT 150 155 175 196 214 225   BMP &GFR Recent Labs  Lab 06/14/22 0113 06/17/22  0107 06/18/22 0149 06/19/22 1130  NA 135 129* 133* 126*  K 3.9 3.8 3.3* 3.9  CL 92* 91* 93* 85*  CO2 18* '25 29 27  '$ GLUCOSE 63* 104* 110* 83  BUN 43* 29* 17 28*  CREATININE 7.05* 5.48* 3.93* 5.61*  CALCIUM 9.0 8.9 8.6* 9.1  MG  --  1.5* 1.5*  --   PHOS  --   --   --  4.1   Estimated Creatinine Clearance: 8.5 mL/min (A) (by C-G formula based on SCr of 5.61 mg/dL (H)). Liver & Pancreas: Recent Labs  Lab 06/19/22 1130  ALBUMIN 2.1*   No results for input(s): "LIPASE", "AMYLASE" in the last 168 hours.  No results for input(s): "AMMONIA" in the last 168 hours.  Diabetic: No results for input(s): "HGBA1C" in the last 72 hours. Recent Labs  Lab 06/15/22 2047  GLUCAP 150*   Cardiac Enzymes: No results for input(s): "CKTOTAL", "CKMB", "CKMBINDEX", "TROPONINI" in the last 168 hours. No results for input(s): "PROBNP" in the last 8760  hours. Coagulation Profile: No results for input(s): "INR", "PROTIME" in the last 168 hours.  Thyroid Function Tests: No results for input(s): "TSH", "T4TOTAL", "FREET4", "T3FREE", "THYROIDAB" in the last 72 hours. Lipid Profile: No results for input(s): "CHOL", "HDL", "LDLCALC", "TRIG", "CHOLHDL", "LDLDIRECT" in the last 72 hours. Anemia Panel: No results for input(s): "VITAMINB12", "FOLATE", "FERRITIN", "TIBC", "IRON", "RETICCTPCT" in the last 72 hours. Urine analysis:    Component Value Date/Time   COLORURINE STRAW (A) 06/12/2022 2040   APPEARANCEUR CLEAR 06/12/2022 2040   LABSPEC 1.006 06/12/2022 2040   PHURINE 7.0 06/12/2022 2040   GLUCOSEU NEGATIVE 06/12/2022 2040   HGBUR SMALL (A) 06/12/2022 2040   BILIRUBINUR NEGATIVE 06/12/2022 2040   KETONESUR NEGATIVE 06/12/2022 2040   PROTEINUR 30 (A) 06/12/2022 2040   UROBILINOGEN 0.2 09/27/2021 1329   NITRITE NEGATIVE 06/12/2022 2040   LEUKOCYTESUR NEGATIVE 06/12/2022 2040   Sepsis Labs: Invalid input(s): "PROCALCITONIN", "LACTICIDVEN"  Microbiology: Recent Results (from the past 240 hour(s))  Resp Panel by RT-PCR (Flu A&B, Covid) Anterior Nasal Swab     Status: None   Collection Time: 06/12/22  3:08 PM   Specimen: Anterior Nasal Swab  Result Value Ref Range Status   SARS Coronavirus 2 by RT PCR NEGATIVE NEGATIVE Final    Comment: (NOTE) SARS-CoV-2 target nucleic acids are NOT DETECTED.  The SARS-CoV-2 RNA is generally detectable in upper respiratory specimens during the acute phase of infection. The lowest concentration of SARS-CoV-2 viral copies this assay can detect is 138 copies/mL. A negative result does not preclude SARS-Cov-2 infection and should not be used as the sole basis for treatment or other patient management decisions. A negative result may occur with  improper specimen collection/handling, submission of specimen other than nasopharyngeal swab, presence of viral mutation(s) within the areas targeted by this  assay, and inadequate number of viral copies(<138 copies/mL). A negative result must be combined with clinical observations, patient history, and epidemiological information. The expected result is Negative.  Fact Sheet for Patients:  EntrepreneurPulse.com.au  Fact Sheet for Healthcare Providers:  IncredibleEmployment.be  This test is no t yet approved or cleared by the [place] FDA and  has been authorized for detection and/or diagnosis of SARS-CoV-2 by FDA under an Emergency Use Authorization (EUA). This EUA will remain  in effect (meaning this test can be used) for the duration of the COVID-19 declaration under Section 564(b)(1) of the Act, 21 U.S.C.section 360bbb-3(b)(1), unless the authorization is terminated  or revoked sooner.  Influenza A by PCR NEGATIVE NEGATIVE Final   Influenza B by PCR NEGATIVE NEGATIVE Final    Comment: (NOTE) The Xpert Xpress SARS-CoV-2/FLU/RSV plus assay is intended as an aid in the diagnosis of influenza from Nasopharyngeal swab specimens and should not be used as a sole basis for treatment. Nasal washings and aspirates are unacceptable for Xpert Xpress SARS-CoV-2/FLU/RSV testing.  Fact Sheet for Patients: EntrepreneurPulse.com.au  Fact Sheet for Healthcare Providers: IncredibleEmployment.be  This test is not yet approved or cleared by the [place] FDA and has been authorized for detection and/or diagnosis of SARS-CoV-2 by FDA under an Emergency Use Authorization (EUA). This EUA will remain in effect (meaning this test can be used) for the duration of the COVID-19 declaration under Section 564(b)(1) of the Act, 21 U.S.C. section 360bbb-3(b)(1), unless the authorization is terminated or revoked.  Performed at [facility], [place] [name] [place], [place] [address]   Blood Culture (routine x 2)     Status: Abnormal   Collection Time: 06/12/22  3:11  PM   Specimen: BLOOD LEFT HAND  Result Value Ref Range Status   Specimen Description BLOOD LEFT HAND  Final   Special Requests   Final    BOTTLES DRAWN AEROBIC ONLY Blood Culture results may not be optimal due to an inadequate volume of blood received in culture bottles   Culture  Setup Time   Final    GRAM POSITIVE COCCI IN CLUSTERS AEROBIC BOTTLE ONLY CRITICAL VALUE NOTED.  VALUE IS CONSISTENT WITH PREVIOUSLY REPORTED AND CALLED VALUE.    Culture (A)  Final    STAPHYLOCOCCUS AUREUS SUSCEPTIBILITIES PERFORMED ON PREVIOUS CULTURE WITHIN THE LAST 5 DAYS. Performed at [facility], [place] [address], [place], [place] [address]    Report Status 06/15/2022 FINAL  Final  Blood Culture (routine x 2)     Status: Abnormal   Collection Time: 06/12/22  3:45 PM   Specimen: BLOOD  Result Value Ref Range Status   Specimen Description BLOOD SITE NOT SPECIFIED  Final   Special Requests   Final    BOTTLES DRAWN AEROBIC AND ANAEROBIC Blood Culture results may not be optimal due to an inadequate volume of blood received in culture bottles   Culture  Setup Time   Final    GRAM POSITIVE COCCI IN BOTH AEROBIC AND ANAEROBIC BOTTLES CRITICAL RESULT CALLED TO, READ BACK BY AND VERIFIED WITH: PHARMD M.MACCIA AT 7672 ON 06/13/2022 BY T.SAAD. Performed at [facility], [place] [address], [place], [place] [address]    Culture METHICILLIN RESISTANT STAPHYLOCOCCUS AUREUS (A)  Final   Report Status 06/15/2022 FINAL  Final   Organism ID, Bacteria METHICILLIN RESISTANT STAPHYLOCOCCUS AUREUS  Final      Susceptibility   Methicillin resistant staphylococcus aureus - MIC*    CIPROFLOXACIN <=0.5 SENSITIVE Sensitive     ERYTHROMYCIN <=0.25 SENSITIVE Sensitive     GENTAMICIN <=0.5 SENSITIVE Sensitive     OXACILLIN >=4 RESISTANT Resistant     TETRACYCLINE >=16 RESISTANT Resistant     VANCOMYCIN 1 SENSITIVE Sensitive     TRIMETH/SULFA <=10 SENSITIVE Sensitive     CLINDAMYCIN <=0.25 SENSITIVE Sensitive      RIFAMPIN <=0.5 SENSITIVE Sensitive     Inducible Clindamycin NEGATIVE Sensitive     * METHICILLIN RESISTANT STAPHYLOCOCCUS AUREUS  Blood Culture ID Panel (Reflexed)     Status: Abnormal   Collection Time: 06/12/22  3:45 PM  Result Value Ref Range Status   Enterococcus faecalis NOT DETECTED  NOT DETECTED Final   Enterococcus Faecium NOT DETECTED NOT DETECTED Final   Listeria monocytogenes NOT DETECTED NOT DETECTED Final   Staphylococcus species DETECTED (A) NOT DETECTED Final    Comment: CRITICAL RESULT CALLED TO, READ BACK BY AND VERIFIED WITH: PHARMD M.MACCIA AT 0750 ON 06/13/2022 BY T.SAAD.    Staphylococcus aureus (BCID) DETECTED (A) NOT DETECTED Final    Comment: Methicillin (oxacillin)-resistant Staphylococcus aureus (MRSA). MRSA is predictably resistant to beta-lactam antibiotics (except ceftaroline). Preferred therapy is vancomycin unless clinically contraindicated. Patient requires contact precautions if  hospitalized. CRITICAL RESULT CALLED TO, READ BACK BY AND VERIFIED WITH: PHARMD M.MACCIA AT 0750 ON 06/13/2022 BY T.SAAD.    Staphylococcus epidermidis NOT DETECTED NOT DETECTED Final   Staphylococcus lugdunensis NOT DETECTED NOT DETECTED Final   Streptococcus species NOT DETECTED NOT DETECTED Final   Streptococcus agalactiae NOT DETECTED NOT DETECTED Final   Streptococcus pneumoniae NOT DETECTED NOT DETECTED Final   Streptococcus pyogenes NOT DETECTED NOT DETECTED Final   A.calcoaceticus-baumannii NOT DETECTED NOT DETECTED Final   Bacteroides fragilis NOT DETECTED NOT DETECTED Final   Enterobacterales NOT DETECTED NOT DETECTED Final   Enterobacter cloacae complex NOT DETECTED NOT DETECTED Final   Escherichia coli NOT DETECTED NOT DETECTED Final   Klebsiella aerogenes NOT DETECTED NOT DETECTED Final   Klebsiella oxytoca NOT DETECTED NOT DETECTED Final   Klebsiella pneumoniae NOT DETECTED NOT DETECTED Final   Proteus species NOT DETECTED NOT DETECTED Final   Salmonella  species NOT DETECTED NOT DETECTED Final   Serratia marcescens NOT DETECTED NOT DETECTED Final   Haemophilus influenzae NOT DETECTED NOT DETECTED Final   Neisseria meningitidis NOT DETECTED NOT DETECTED Final   Pseudomonas aeruginosa NOT DETECTED NOT DETECTED Final   Stenotrophomonas maltophilia NOT DETECTED NOT DETECTED Final   Candida albicans NOT DETECTED NOT DETECTED Final   Candida auris NOT DETECTED NOT DETECTED Final   Candida glabrata NOT DETECTED NOT DETECTED Final   Candida krusei NOT DETECTED NOT DETECTED Final   Candida parapsilosis NOT DETECTED NOT DETECTED Final   Candida tropicalis NOT DETECTED NOT DETECTED Final   Cryptococcus neoformans/gattii NOT DETECTED NOT DETECTED Final   Meth resistant mecA/C and MREJ DETECTED (A) NOT DETECTED Final    Comment: CRITICAL RESULT CALLED TO, READ BACK BY AND VERIFIED WITH: PHARMD M.MACCIA AT 0750 ON 06/13/2022 BY T.SAAD. Performed at [facility], [place] [address], [place], [place] [address]   Culture, blood (Routine X 2) w Reflex to ID Panel     Status: None   Collection Time: 06/14/22  1:08 AM   Specimen: BLOOD  Result Value Ref Range Status   Specimen Description BLOOD RIGHT ANTECUBITAL  Final   Special Requests   Final    BOTTLES DRAWN AEROBIC ONLY Blood Culture adequate volume   Culture   Final    NO GROWTH 5 DAYS Performed at [facility], [place] [name], [place], [place] [address]    Report Status 06/19/2022 FINAL  Final  Culture, blood (Routine X 2) w Reflex to ID Panel     Status: None   Collection Time: 06/14/22  1:13 AM   Specimen: BLOOD RIGHT HAND  Result Value Ref Range Status   Specimen Description BLOOD RIGHT HAND  Final   Special Requests   Final    BOTTLES DRAWN AEROBIC AND ANAEROBIC Blood Culture adequate volume   Culture   Final    NO GROWTH 5 DAYS Performed at [facility], [place],  [place] [address]    Report Status 06/19/2022 FINAL  Final  Cath Tip Culture     Status:  Abnormal   Collection Time: 06/14/22  3:23 PM   Specimen: Catheter Tip; Other  Result Value Ref Range Status   Specimen Description CATH TIP  Final   Special Requests   Final    NONE Performed at [facility], [place] [address], [place], [place] [address]    Culture (A)  Final    20,000 COLONIES/mL METHICILLIN RESISTANT STAPHYLOCOCCUS AUREUS   Report Status 06/17/2022 FINAL  Final   Organism ID, Bacteria METHICILLIN RESISTANT STAPHYLOCOCCUS AUREUS (A)  Final      Susceptibility   Methicillin resistant staphylococcus aureus - MIC*    CIPROFLOXACIN <=0.5 SENSITIVE Sensitive     ERYTHROMYCIN <=0.25 SENSITIVE Sensitive     GENTAMICIN <=0.5 SENSITIVE Sensitive     OXACILLIN RESISTANT Resistant     TETRACYCLINE >=16 RESISTANT Resistant     VANCOMYCIN 1 SENSITIVE Sensitive     TRIMETH/SULFA <=10 SENSITIVE Sensitive     CLINDAMYCIN <=0.25 SENSITIVE Sensitive     RIFAMPIN <=0.5 SENSITIVE Sensitive     Inducible Clindamycin NEGATIVE Sensitive     * 20,000 COLONIES/mL METHICILLIN RESISTANT STAPHYLOCOCCUS AUREUS  MRSA Next Gen by PCR, Nasal     Status: Abnormal   Collection Time: 06/15/22  8:17 AM   Specimen: Nasal Mucosa; Nasal Swab  Result Value Ref Range Status   MRSA by PCR Next Gen DETECTED (A) NOT DETECTED Final    Comment: RESULT CALLED TO, READ BACK BY AND VERIFIED WITH: RN Nathanial Rancher [id number] AT 1203 BY EC (NOTE) The GeneXpert MRSA Assay (FDA approved for NASAL specimens only), is one component of a comprehensive MRSA colonization surveillance program. It is not intended to diagnose MRSA infection nor to guide or monitor treatment for MRSA infections. Test performance is not FDA approved in patients less than 34 years old. Performed at [facility], [place] [address], [place], [place] [address]   Culture, blood (Routine X 2) w Reflex to ID Panel     Status: None   Collection Time: 06/15/22 10:20 AM   Specimen: BLOOD  Result Value Ref Range Status   Specimen  Description BLOOD BLOOD RIGHT HAND  Final   Special Requests AEROBIC BOTTLE ONLY Blood Culture adequate volume  Final   Culture   Final    NO GROWTH 5 DAYS Performed at [facility], [place] [address] [place], [place] [address]    Report Status 06/20/2022 FINAL  Final  Culture, blood (Routine X 2) w Reflex to ID Panel     Status: None   Collection Time: 06/15/22 10:21 AM   Specimen: BLOOD  Result Value Ref Range Status   Specimen Description BLOOD BLOOD RIGHT WRIST  Final   Special Requests AEROBIC BOTTLE ONLY Blood Culture adequate volume  Final   Culture   Final    NO GROWTH 5 DAYS Performed at [facility], [place] [address], [place], [place] [address]    Report Status 06/20/2022 FINAL  Final    Radiology Studies: No results found.    Phillips Climes MD [facility]  If 7PM-7AM, please contact night-coverage [email or web address] 06/20/2022, 11:50 AM

## 2022-06-21 DIAGNOSIS — N186 End stage renal disease: Secondary | ICD-10-CM | POA: Diagnosis not present

## 2022-06-21 DIAGNOSIS — B9562 Methicillin resistant Staphylococcus aureus infection as the cause of diseases classified elsewhere: Secondary | ICD-10-CM | POA: Diagnosis not present

## 2022-06-21 DIAGNOSIS — K828 Other specified diseases of gallbladder: Secondary | ICD-10-CM | POA: Diagnosis not present

## 2022-06-21 DIAGNOSIS — R7881 Bacteremia: Secondary | ICD-10-CM | POA: Diagnosis not present

## 2022-06-21 LAB — RENAL FUNCTION PANEL
Albumin: 2.4 g/dL — ABNORMAL LOW (ref 3.5–5.0)
Anion gap: 11 (ref 5–15)
BUN: 28 mg/dL — ABNORMAL HIGH (ref 8–23)
CO2: 25 mmol/L (ref 22–32)
Calcium: 9.4 mg/dL (ref 8.9–10.3)
Chloride: 97 mmol/L — ABNORMAL LOW (ref 98–111)
Creatinine, Ser: 5.4 mg/dL — ABNORMAL HIGH (ref 0.44–1.00)
GFR, Estimated: 8 mL/min — ABNORMAL LOW (ref >60)
Glucose, Bld: 89 mg/dL (ref 70–99)
Phosphorus: 5.4 mg/dL — ABNORMAL HIGH (ref 2.5–4.6)
Potassium: 4.7 mmol/L (ref 3.5–5.1)
Sodium: 133 mmol/L — ABNORMAL LOW (ref 135–145)

## 2022-06-21 MED ORDER — RENA-VITE PO TABS
1.0000 | ORAL_TABLET | Freq: Every day | ORAL | Status: DC
  Administered 2022-06-21 – 2022-06-29 (×9): 1 via ORAL
  Filled 2022-06-21 (×9): qty 1

## 2022-06-21 MED ORDER — APIXABAN 2.5 MG PO TABS
2.5000 mg | ORAL_TABLET | Freq: Two times a day (BID) | ORAL | Status: AC
  Administered 2022-06-21: 2.5 mg via ORAL
  Filled 2022-06-21: qty 1

## 2022-06-21 MED ORDER — BOOST / RESOURCE BREEZE PO LIQD CUSTOM
1.0000 | Freq: Three times a day (TID) | ORAL | Status: DC
  Administered 2022-06-21 – 2022-06-30 (×23): 1 via ORAL

## 2022-06-21 NOTE — Progress Notes (Signed)
Occupational Therapy Treatment Patient Details Name: Diamond [name] MRN: [id number] DOB: July 06, 1951 Today's Date: 06/21/2022   History of present illness Pt is a 71 y/o female admitted secondary to abdominal pain, vomiting and missed HD. Generalized abdominal pain suspect related to gallbladder mass seen on CT scan. Pt also found to have MRSA and acute metabolic encephalopathy. PMH including but not limited to ESRD, HTN and a-flutter. Planning for TEE on 7/13.   OT comments  Pt with decreased insight, eager to go home, but not to mobilize in order to progress. Pt disoriented to time and place. Complains of back pain, flexed posture with R side lean during grooming activities at EOB. Pt stood with +2 min assist and demonstrated ability to side step along EOB with RW. Continue to recommend ST rehab in SNF.   Recommendations for follow up therapy are one component of a multi-disciplinary discharge planning process, led by the attending physician.  Recommendations may be updated based on patient status, additional functional criteria and insurance authorization.    Follow Up Recommendations  Skilled nursing-short term rehab (<3 hours/day)    Assistance Recommended at Discharge Frequent or constant Supervision/Assistance  Patient can return home with the following  A lot of help with walking and/or transfers;A lot of help with bathing/dressing/bathroom;Assistance with cooking/housework;Direct supervision/assist for medications management;Direct supervision/assist for financial management;Assist for transportation;Help with stairs or ramp for entrance   Equipment Recommendations  Other (comment) (defer to next venue of care)    Recommendations for Other Services      Precautions / Restrictions Precautions Precautions: Fall Precaution Comments: reported 2 recent falls Restrictions Weight Bearing Restrictions: No       Mobility Bed Mobility Overal bed mobility: Needs Assistance Bed  Mobility: Supine to Sit, Rolling, Sit to Supine Rolling: Supervision   Supine to sit: Min assist, HOB elevated Sit to supine: Mod assist   General bed mobility comments: pt rolling bilaterally in the bed in an attempt to get comfortable prior to sitting up; min A to assist with trunk elevation to achieve an upright sitting position; mod A to return bilateral LEs onto bed    Transfers Overall transfer level: Needs assistance Equipment used: Rolling walker (2 wheels) Transfers: Sit to/from Stand Sit to Stand: Min assist, +2 safety/equipment, +2 physical assistance           General transfer comment: cueing for safe hand placement and technique with RW, assistance for stability     Balance Overall balance assessment: Needs assistance   Sitting balance-Leahy Scale: Poor Sitting balance - Comments: flexed posture, R side lean Postural control: Right lateral lean Standing balance support: During functional activity, Reliant on assistive device for balance Standing balance-Leahy Scale: Poor Standing balance comment: reliant on external support and RW                           ADL either performed or assessed with clinical judgement   ADL Overall ADL's : Needs assistance/impaired Eating/Feeding: Minimal assistance Eating/Feeding Details (indicate cue type and reason): assist to cut food Grooming: Wash/dry hands;Wash/dry face;Minimal assistance;Oral care;Sitting;Cueing for sequencing                                      Extremity/Trunk Assessment              Vision       Perception  Praxis      Cognition Arousal/Alertness: Awake/alert Behavior During Therapy: Flat affect Overall Cognitive Status: Impaired/Different from baseline Area of Impairment: Orientation, Memory, Following commands, Safety/judgement, Awareness, Problem solving                 Orientation Level: Disoriented to, Time, Place   Memory: Decreased short-term  memory Following Commands: Follows one step commands with increased time Safety/Judgement: Decreased awareness of safety, Decreased awareness of deficits Awareness: Emergent Problem Solving: Slow processing, Decreased initiation, Difficulty sequencing, Requires verbal cues, Requires tactile cues General Comments: pt needing moderate cues to sequence oral care        Exercises      Shoulder Instructions       General Comments      Pertinent Vitals/ Pain       Pain Assessment Pain Assessment: Faces Faces Pain Scale: Hurts even more Pain Location: lower back Pain Descriptors / Indicators: Grimacing, Guarding Pain Intervention(s): Monitored during session, Repositioned  Home Living                                          Prior Functioning/Environment              Frequency  Min 2X/week        Progress Toward Goals  OT Goals(current goals can now be found in the care plan section)  Progress towards OT goals: Progressing toward goals  Acute Rehab OT Goals OT Goal Formulation: With patient Time For Goal Achievement: 07/04/22 Potential to Achieve Goals: Good  Plan Discharge plan remains appropriate    Co-evaluation    PT/OT/SLP Co-Evaluation/Treatment: Yes Reason for Co-Treatment: For patient/therapist safety PT goals addressed during session: Mobility/safety with mobility;Balance;Proper use of DME;Strengthening/ROM OT goals addressed during session: ADL's and self-care      AM-PAC OT "6 Clicks" Daily Activity     Outcome Measure   Help from another person eating meals?: A Little Help from another person taking care of personal grooming?: A Little Help from another person toileting, which includes using toliet, bedpan, or urinal?: A Lot Help from another person bathing (including washing, rinsing, drying)?: A Lot Help from another person to put on and taking off regular upper body clothing?: A Lot Help from another person to put on and  taking off regular lower body clothing?: Total 6 Click Score: 13    End of Session Equipment Utilized During Treatment: Gait belt;Rolling walker (2 wheels)  OT Visit Diagnosis: Unsteadiness on feet (R26.81);Other abnormalities of gait and mobility (R26.89);Muscle weakness (generalized) (M62.81);Other symptoms and signs involving cognitive function   Activity Tolerance Patient limited by pain   Patient Left in bed;with call bell/phone within reach;with bed alarm set   Nurse Communication          Time: 0350-0938 OT Time Calculation (min): 35 min  Charges: OT General Charges $OT Visit: 1 Visit OT Treatments $Self Care/Home Management : 8-22 mins  Cleta Alberts, OTR/L Acute Rehabilitation Services Office: [phone]   Malka So 06/21/2022, 1:50 PM

## 2022-06-21 NOTE — Progress Notes (Signed)
Nephrology Follow-Up Consult note   Assessment/Recommendations: Diamond [name] is a/an 71 y.o. female with a past medical history significant for ESRD, MRSA endocarditis, gallbladder mass, HTN, afib, admitted for MRSA bacteremia.        OP HD: [facility] 4h  60.3kg  400/600  2/2 bath  LUE AVF (TDC removed here) Hep none - mircera 150 q2, last 6/24 - hectorol 4 ug iv tts     Problem/Plan: MRSA Bacteremia - H/o MRSA bacteremia/endocarditis. IV Vancomycin per primary./ID =" end date Vanco 07/13/22" had negative TEE/ TDC removed on 7/10 by [facility]. Using AVF now.  Gallbladder mass - gen surg team consulting, per notes =concern for GB adenoCa  recommending GB removal possibly OR today AMS -now resolved, probably related to infection, no acute findings on head CT.  ESRD -  HD TTS.  On schedule   Volume  - BPs good, no sig vol overload. Keep even w/ HD.  Anemia  - Hgb 9.5, aranesp 177mg due on 79/35Metabolic bone disease -phosphorus has improved.  Continue binders HTN - on diltiazem, bp's good Hx atrial flutter - getting diltiazem 30 tid here   Recommendations conveyed to primary service.    [facility] 06/21/2022 9:12 AM  ___________________________________________________________  CC: MRSA bacteremia  Interval History/Subjective: Patient feels well today with no complaints.  Feels like she is getting stronger every day.   Medications:  Current Facility-Administered Medications  Medication Dose Route Frequency Provider Last Rate Last Admin   acetaminophen (TYLENOL) suppository 650 mg  650 mg Rectal Q6H PRN CBuford Dresser MD   650 mg at 06/13/22 2151   acetaminophen (TYLENOL) tablet 650 mg  650 mg Oral Q6H PRN GWendee BeaversT, MD   650 mg at 06/19/22 2147   apixaban (ELIQUIS) tablet 2.5 mg  2.5 mg Oral BID CBuford Dresser MD   2.5 mg at 06/20/22 2052   Chlorhexidine Gluconate Cloth 2 % PADS 6 each  6 each Topical Q0600 CBuford Dresser MD   6 each at 06/21/22 0514   Chlorhexidine Gluconate Cloth 2 % PADS 6 each  6 each Topical Q0600 SRoney Jaffe MD   6 each at 06/21/22 0513   [START ON 06/22/2022] Darbepoetin Alfa (ARANESP) injection 150 mcg  150 mcg Intravenous Q Tue-HD ZErnest Haber PA-C       diltiazem (CARDIZEM CD) 24 hr capsule 240 mg  240 mg Oral Daily GWendee BeaversT, MD   240 mg at 06/20/22 0826   diltiazem (CARDIZEM) tablet 30 mg  30 mg Oral Q6H PRN GWendee BeaversT, MD   30 mg at 06/19/22 2325   HYDROmorphone (DILAUDID) injection 0.5 mg  0.5 mg Intravenous Q4H PRN CBuford Dresser MD       labetalol (NORMODYNE) injection 10 mg  10 mg Intravenous Q2H PRN GWendee BeaversT, MD   10 mg at 06/21/22 0410   lidocaine HCl (PF) (XYLOCAINE) 2 % injection 0-20 mL  0-20 mL Intradermal Once PRN CBuford Dresser MD       lip balm (CARMEX) ointment   Topical PRN CBuford Dresser MD       Oral care mouth rinse  15 mL Mouth Rinse PRN GWendee BeaversT, MD       oxyCODONE (Oxy IR/ROXICODONE) immediate release tablet 5 mg  5 mg Oral Q6H PRN CBuford Dresser MD   5 mg at 06/21/22 0008   pantoprazole (PROTONIX) EC tablet 40 mg  40 mg Oral BID CBuford Dresser MD  40 mg at 06/20/22 2052   prochlorperazine (COMPAZINE) injection 10 mg  10 mg Intravenous Q6H PRN Buford Dresser, MD       sevelamer carbonate (RENVELA) tablet 1,600 mg  1,600 mg Oral TID WC Buford Dresser, MD   1,600 mg at 06/20/22 1159   vancomycin (VANCOREADY) IVPB 500 mg/100 mL  500 mg Intravenous Q T,Th,Sa-HD Mignon Pine, DO   Stopped at 06/19/22 1250      Review of Systems: 10 systems reviewed and negative except per interval history/subjective  Physical Exam: Vitals:   06/21/22 0353 06/21/22 0831  BP: (!) 169/110 (!) 162/87  Pulse: 86 84  Resp: 17 13  Temp: 98.7 F (37.1 C) 98.7 F (37.1 C)  SpO2: 99% 99%   No intake/output data recorded. No intake or output data in the 24 hours ending 06/21/22  0912 Constitutional: well-appearing, no acute distress ENMT: ears and nose without scars or lesions, MMM CV: normal rate, no edema Respiratory: Bilateral chest rise, normal work of breathing Gastrointestinal: soft, non-tender, no palpable masses or hernias Skin: no visible lesions or rashes Psych: alert, judgement/insight appropriate, appropriate mood and affect   Test Results I personally reviewed new and old clinical labs and radiology tests Lab Results  Component Value Date   NA 133 (L) 06/21/2022   K 4.7 06/21/2022   CL 97 (L) 06/21/2022   CO2 25 06/21/2022   BUN 28 (H) 06/21/2022   CREATININE 5.40 (H) 06/21/2022   CALCIUM 9.4 06/21/2022   ALBUMIN 2.4 (L) 06/21/2022   PHOS 5.4 (H) 06/21/2022    CBC Recent Labs  Lab 06/15/22 0243 06/16/22 0702 06/17/22 0107 06/18/22 0149 06/19/22 0911  WBC 7.4   < > 6.0 5.7 5.8  NEUTROABS 5.9  --   --   --   --   HGB 10.9*   < > 10.1* 10.2* 9.5*  HCT 32.2*   < > 28.9* 30.3* 28.4*  MCV 95.3   < > 92.9 95.3 95.6  PLT 155   < > 196 214 225   < > = values in this interval not displayed.

## 2022-06-21 NOTE — Progress Notes (Signed)
PROGRESS NOTE  Diamond [name] VHQ:[id number] DOB: 22-Sep-1951   PCP: [name], MD  Patient is from: Home.  Lives alone.  Uses walker at baseline.  DOA: 06/12/2022 LOS: 9  Chief complaints Nausea, vomiting and diarrhea    Brief Narrative / Interim history: 71 year old F with PMH of ESRD on HD TTS, prior MRSA bacteremia with L5-S1 discitis and endocarditis, gallbladder mass, ACD, HTN and paroxysmal A-fib presenting with nausea, vomiting and diarrhea, and found to have MRSA bacteremia likely from dialysis catheter.  CT abdomen and pelvis also showed previously known gallbladder mass suspicious for cholangiocarcinoma.  Patient was started on IV vancomycin.  ID, nephrology and general surgery consulted.   Dialysis catheter removed.  Repeat blood culture NGTD.  TEE negative for vegetation but similar very small mobile echodensity seen in RVOT which was better visualized on prior TTE on 03/03/2022.  ID recommends vancomycin with HD for 4 weeks with end date of 07/13/2022.  General surgery planning to take patient to the OR for gallbladder mass later next week.  Hospital course noteworthy of acute metabolic encephalopathy and paroxysmal A-fib with RVR, now resolved.  Subjective: Was seen and examined, she complains of abdominal pain right upper quadrant area, she reports it has been going on for last 2 weeks.    Objective: Vitals:   06/21/22 0353 06/21/22 0500 06/21/22 0831 06/21/22 1224  BP: (!) 169/110  (!) 162/87 (!) 155/106  Pulse: 86  84 85  Resp: '17  13 16  '$ Temp: 98.7 F (37.1 C)  98.7 F (37.1 C) 98.5 F (36.9 C)  TempSrc: Oral  Oral Oral  SpO2: 99%  99% (!) 79%  Weight:  59.2 kg    Height:        Examination:  Awake Alert, frail. Symmetrical Chest wall movement, Good air movement bilaterally, CTAB RRR,No Gallops,Rubs or new Murmurs, No Parasternal Heave +ve B.Sounds, Abd Soft, No tenderness, No rebound - guarding or rigidity. No Cyanosis, Clubbing or edema, No new  Rash or bruise     Procedures:  7/13-TEE.  Microbiology summarized: 7/8-COVID-19 and influenza PCR nonreactive. 7/8-blood culture with MRSA 7/10-catheter tip culture with MRSA. 7/10-repeat blood culture NGTD 7/11-repeat blood culture NGTD  Assessment and plan: Principal Problem:   MRSA bacteremia Active Problems:   Gallbladder mass   Hyperkalemia   Hypertension   ESRD on dialysis ([facility])   Paroxysmal atrial flutter ([facility])   Acute metabolic encephalopathy   Hyponatremia   Hypokalemia   Lower back pain   Pressure injury of skin   Physical deconditioning  MRSA bacteremia: Thought to be from HD cath.  Previous history of MRSA bacteremia and L5-S1 discitis and endocarditis about 6 months ago.  TDC removed on 7/10.  Catheter tip culture with MRSA.  TEE negative for vegetation but but similar very small mobile echodensity seen in RVOT which was better visualized on prior TTE on 03/03/2022.  Repeat blood culture on 7/10 and 7/11 NGTD. -ID recommended vancomycin with HD and total 07/13/2022.   Known history of gallbladder mass-lost to follow-up outpatient. -Management per general surgery, plan to take the OR this admission, surgery is this Friday, will hold Eliquis today, and start on heparin GTT tomorrow, and this to be held 6 hours before surgery.  ESRD on HD MWF: TDC removed due to bacteremia.  Using aVF for HD. Anemia of renal disease: Stable Hyponatremia/hyperkalemia/hypokalemia -Per nephrology  Essential hypertension: Normotensive. -Continue Cardizem   Paroxysmal atrial flutter/A-fib with RVR: RVR seems to have resolved.  -Consolidate  to Cardizem CD 240 mg daily with short acting Cardizem 30 mg every 6h as needed -Continue Eliquis for anticoagulation   Acute metabolic encephalopathy: Seems to have resolved.  Oriented x4. -Reorientation and delirium precautions -Minimize sedating medication  Chronic back pain: CT abdomen and pelvis showed degenerative changes.  Pain seems to  have resolved. -Continue Tylenol as needed  Physical deconditioning -PT/OT-recommended SNF but patient hopes to return home   Increased nutrient needs Body mass index is 20.44 kg/m. -Consult dietitian  Pressure ulcer stage II at the coccyx. Pressure Injury Coccyx Stage 2 -  Partial thickness loss of dermis presenting as a shallow open injury with a red, pink wound bed without slough. (Active)     Location: Coccyx  Location Orientation:   Staging: Stage 2 -  Partial thickness loss of dermis presenting as a shallow open injury with a red, pink wound bed without slough.  Wound Description (Comments):   Present on Admission: Yes  Dressing Type Foam - Lift dressing to assess site every shift 06/20/22 2030   DVT prophylaxis:  apixaban (ELIQUIS) tablet 2.5 mg Start: 06/21/22 2200 apixaban (ELIQUIS) tablet 2.5 mg  Code Status: Full code Family Communication: None at bedside Level of care: Telemetry Medical Status is: Inpatient Remains inpatient appropriate because: Due to MRSA bacteremia and gallbladder mass   Final disposition: TBD Consultants:  Cardiology Infectious disease Nephrology Vascular surgery Interventional radiology General surgery  Sch Meds:  Scheduled Meds:  apixaban  2.5 mg Oral BID   Chlorhexidine Gluconate Cloth  6 each Topical Q0600   Chlorhexidine Gluconate Cloth  6 each Topical Q0600   [START ON 06/22/2022] darbepoetin (ARANESP) injection - DIALYSIS  150 mcg Intravenous Q Tue-HD   diltiazem  240 mg Oral Daily   pantoprazole  40 mg Oral BID   sevelamer carbonate  1,600 mg Oral TID WC   Continuous Infusions:  vancomycin Stopped (06/19/22 1250)   PRN Meds:.acetaminophen, acetaminophen, diltiazem, HYDROmorphone (DILAUDID) injection, labetalol, lidocaine HCl (PF), lip balm, mouth rinse, oxyCODONE, prochlorperazine  Antimicrobials: Anti-infectives (From admission, onward)    Start     Dose/Rate Route Frequency Ordered Stop   06/15/22 1200  vancomycin  (VANCOREADY) IVPB 500 mg/100 mL        500 mg 100 mL/hr over 60 Minutes Intravenous Every T-Th-Sa (Hemodialysis) 06/15/22 1028 07/13/22 2359   06/14/22 1200  vancomycin (VANCOREADY) IVPB 500 mg/100 mL        500 mg 100 mL/hr over 60 Minutes Intravenous Every M-W-F (Hemodialysis) 06/14/22 0814 06/14/22 1714   06/13/22 1130  cefTRIAXone (ROCEPHIN) 2 g in sodium chloride 0.9 % 100 mL IVPB  Status:  Discontinued        2 g 200 mL/hr over 30 Minutes Intravenous Every 24 hours 06/13/22 1118 06/13/22 1249   06/13/22 0800  vancomycin (VANCOREADY) IVPB 1250 mg/250 mL        1,250 mg 166.7 mL/hr over 90 Minutes Intravenous  Once 06/13/22 0757 06/13/22 1223   06/13/22 0757  vancomycin variable dose per unstable renal function (pharmacist dosing)  Status:  Discontinued         Does not apply See admin instructions 06/13/22 0757 06/15/22 1029        I have personally reviewed the following labs and images: CBC: Recent Labs  Lab 06/15/22 0243 06/16/22 0702 06/17/22 0107 06/18/22 0149 06/19/22 0911  WBC 7.4 5.3 6.0 5.7 5.8  NEUTROABS 5.9  --   --   --   --   HGB 10.9* 10.1*  10.1* 10.2* 9.5*  HCT 32.2* 29.8* 28.9* 30.3* 28.4*  MCV 95.3 96.1 92.9 95.3 95.6  PLT 155 175 196 214 225   BMP &GFR Recent Labs  Lab 06/17/22 0107 06/18/22 0149 06/19/22 1130 06/21/22 0643  NA 129* 133* 126* 133*  K 3.8 3.3* 3.9 4.7  CL 91* 93* 85* 97*  CO2 '25 29 27 25  '$ GLUCOSE 104* 110* 83 89  BUN 29* 17 28* 28*  CREATININE 5.48* 3.93* 5.61* 5.40*  CALCIUM 8.9 8.6* 9.1 9.4  MG 1.5* 1.5*  --   --   PHOS  --   --  4.1 5.4*   Estimated Creatinine Clearance: 8.8 mL/min (A) (by C-G formula based on SCr of 5.4 mg/dL (H)). Liver & Pancreas: Recent Labs  Lab 06/19/22 1130 06/21/22 0643  ALBUMIN 2.1* 2.4*   No results for input(s): "LIPASE", "AMYLASE" in the last 168 hours.  No results for input(s): "AMMONIA" in the last 168 hours.  Diabetic: No results for input(s): "HGBA1C" in the last 72  hours. Recent Labs  Lab 06/15/22 2047  GLUCAP 150*   Cardiac Enzymes: No results for input(s): "CKTOTAL", "CKMB", "CKMBINDEX", "TROPONINI" in the last 168 hours. No results for input(s): "PROBNP" in the last 8760 hours. Coagulation Profile: No results for input(s): "INR", "PROTIME" in the last 168 hours.  Thyroid Function Tests: No results for input(s): "TSH", "T4TOTAL", "FREET4", "T3FREE", "THYROIDAB" in the last 72 hours. Lipid Profile: No results for input(s): "CHOL", "HDL", "LDLCALC", "TRIG", "CHOLHDL", "LDLDIRECT" in the last 72 hours. Anemia Panel: No results for input(s): "VITAMINB12", "FOLATE", "FERRITIN", "TIBC", "IRON", "RETICCTPCT" in the last 72 hours. Urine analysis:    Component Value Date/Time   COLORURINE STRAW (A) 06/12/2022 2040   APPEARANCEUR CLEAR 06/12/2022 2040   LABSPEC 1.006 06/12/2022 2040   PHURINE 7.0 06/12/2022 2040   GLUCOSEU NEGATIVE 06/12/2022 2040   HGBUR SMALL (A) 06/12/2022 2040   BILIRUBINUR NEGATIVE 06/12/2022 2040   KETONESUR NEGATIVE 06/12/2022 2040   PROTEINUR 30 (A) 06/12/2022 2040   UROBILINOGEN 0.2 09/27/2021 1329   NITRITE NEGATIVE 06/12/2022 2040   LEUKOCYTESUR NEGATIVE 06/12/2022 2040   Sepsis Labs: Invalid input(s): "PROCALCITONIN", "LACTICIDVEN"  Microbiology: Recent Results (from the past 240 hour(s))  Resp Panel by RT-PCR (Flu A&B, Covid) Anterior Nasal Swab     Status: None   Collection Time: 06/12/22  3:08 PM   Specimen: Anterior Nasal Swab  Result Value Ref Range Status   SARS Coronavirus 2 by RT PCR NEGATIVE NEGATIVE Final    Comment: (NOTE) SARS-CoV-2 target nucleic acids are NOT DETECTED.  The SARS-CoV-2 RNA is generally detectable in upper respiratory specimens during the acute phase of infection. The lowest concentration of SARS-CoV-2 viral copies this assay can detect is 138 copies/mL. A negative result does not preclude SARS-Cov-2 infection and should not be used as the sole basis for treatment or other  patient management decisions. A negative result may occur with  improper specimen collection/handling, submission of specimen other than nasopharyngeal swab, presence of viral mutation(s) within the areas targeted by this assay, and inadequate number of viral copies(<138 copies/mL). A negative result must be combined with clinical observations, patient history, and epidemiological information. The expected result is Negative.  Fact Sheet for Patients:  EntrepreneurPulse.com.au  Fact Sheet for Healthcare Providers:  IncredibleEmployment.be  This test is no t yet approved or cleared by the [place] FDA and  has been authorized for detection and/or diagnosis of SARS-CoV-2 by FDA under an Emergency Use Authorization (EUA). This EUA  will remain  in effect (meaning this test can be used) for the duration of the COVID-19 declaration under Section 564(b)(1) of the Act, 21 U.S.C.section 360bbb-3(b)(1), unless the authorization is terminated  or revoked sooner.       Influenza A by PCR NEGATIVE NEGATIVE Final   Influenza B by PCR NEGATIVE NEGATIVE Final    Comment: (NOTE) The Xpert Xpress SARS-CoV-2/FLU/RSV plus assay is intended as an aid in the diagnosis of influenza from Nasopharyngeal swab specimens and should not be used as a sole basis for treatment. Nasal washings and aspirates are unacceptable for Xpert Xpress SARS-CoV-2/FLU/RSV testing.  Fact Sheet for Patients: EntrepreneurPulse.com.au  Fact Sheet for Healthcare Providers: IncredibleEmployment.be  This test is not yet approved or cleared by the [place] FDA and has been authorized for detection and/or diagnosis of SARS-CoV-2 by FDA under an Emergency Use Authorization (EUA). This EUA will remain in effect (meaning this test can be used) for the duration of the COVID-19 declaration under Section 564(b)(1) of the Act, 21 U.S.C. section  360bbb-3(b)(1), unless the authorization is terminated or revoked.  Performed at [facility], [place] [address], [place], [place] [address]   Blood Culture (routine x 2)     Status: Abnormal   Collection Time: 06/12/22  3:11 PM   Specimen: BLOOD LEFT HAND  Result Value Ref Range Status   Specimen Description BLOOD LEFT HAND  Final   Special Requests   Final    BOTTLES DRAWN AEROBIC ONLY Blood Culture results may not be optimal due to an inadequate volume of blood received in culture bottles   Culture  Setup Time   Final    GRAM POSITIVE COCCI IN CLUSTERS AEROBIC BOTTLE ONLY CRITICAL VALUE NOTED.  VALUE IS CONSISTENT WITH PREVIOUSLY REPORTED AND CALLED VALUE.    Culture (A)  Final    STAPHYLOCOCCUS AUREUS SUSCEPTIBILITIES PERFORMED ON PREVIOUS CULTURE WITHIN THE LAST 5 DAYS. Performed at [facility], [place] [address] [place], [place] [address]    Report Status 06/15/2022 FINAL  Final  Blood Culture (routine x 2)     Status: Abnormal   Collection Time: 06/12/22  3:45 PM   Specimen: BLOOD  Result Value Ref Range Status   Specimen Description BLOOD SITE NOT SPECIFIED  Final   Special Requests   Final    BOTTLES DRAWN AEROBIC AND ANAEROBIC Blood Culture results may not be optimal due to an inadequate volume of blood received in culture bottles   Culture  Setup Time   Final    GRAM POSITIVE COCCI IN BOTH AEROBIC AND ANAEROBIC BOTTLES CRITICAL RESULT CALLED TO, READ BACK BY AND VERIFIED WITH: PHARMD M.MACCIA AT 3299 ON 06/13/2022 BY T.SAAD. Performed at [facility], [place] [address] [place], [place] [address]    Culture METHICILLIN RESISTANT STAPHYLOCOCCUS AUREUS (A)  Final   Report Status 06/15/2022 FINAL  Final   Organism ID, Bacteria METHICILLIN RESISTANT STAPHYLOCOCCUS AUREUS  Final      Susceptibility   Methicillin resistant staphylococcus aureus - MIC*    CIPROFLOXACIN <=0.5 SENSITIVE Sensitive     ERYTHROMYCIN <=0.25 SENSITIVE Sensitive     GENTAMICIN <=0.5  SENSITIVE Sensitive     OXACILLIN >=4 RESISTANT Resistant     TETRACYCLINE >=16 RESISTANT Resistant     VANCOMYCIN 1 SENSITIVE Sensitive     TRIMETH/SULFA <=10 SENSITIVE Sensitive     CLINDAMYCIN <=0.25 SENSITIVE Sensitive     RIFAMPIN <=0.5 SENSITIVE Sensitive     Inducible Clindamycin NEGATIVE  Sensitive     * METHICILLIN RESISTANT STAPHYLOCOCCUS AUREUS  Blood Culture ID Panel (Reflexed)     Status: Abnormal   Collection Time: 06/12/22  3:45 PM  Result Value Ref Range Status   Enterococcus faecalis NOT DETECTED NOT DETECTED Final   Enterococcus Faecium NOT DETECTED NOT DETECTED Final   Listeria monocytogenes NOT DETECTED NOT DETECTED Final   Staphylococcus species DETECTED (A) NOT DETECTED Final    Comment: CRITICAL RESULT CALLED TO, READ BACK BY AND VERIFIED WITH: PHARMD M.MACCIA AT 0750 ON 06/13/2022 BY T.SAAD.    Staphylococcus aureus (BCID) DETECTED (A) NOT DETECTED Final    Comment: Methicillin (oxacillin)-resistant Staphylococcus aureus (MRSA). MRSA is predictably resistant to beta-lactam antibiotics (except ceftaroline). Preferred therapy is vancomycin unless clinically contraindicated. Patient requires contact precautions if  hospitalized. CRITICAL RESULT CALLED TO, READ BACK BY AND VERIFIED WITH: PHARMD M.MACCIA AT 0750 ON 06/13/2022 BY T.SAAD.    Staphylococcus epidermidis NOT DETECTED NOT DETECTED Final   Staphylococcus lugdunensis NOT DETECTED NOT DETECTED Final   Streptococcus species NOT DETECTED NOT DETECTED Final   Streptococcus agalactiae NOT DETECTED NOT DETECTED Final   Streptococcus pneumoniae NOT DETECTED NOT DETECTED Final   Streptococcus pyogenes NOT DETECTED NOT DETECTED Final   A.calcoaceticus-baumannii NOT DETECTED NOT DETECTED Final   Bacteroides fragilis NOT DETECTED NOT DETECTED Final   Enterobacterales NOT DETECTED NOT DETECTED Final   Enterobacter cloacae complex NOT DETECTED NOT DETECTED Final   Escherichia coli NOT DETECTED NOT DETECTED Final    Klebsiella aerogenes NOT DETECTED NOT DETECTED Final   Klebsiella oxytoca NOT DETECTED NOT DETECTED Final   Klebsiella pneumoniae NOT DETECTED NOT DETECTED Final   Proteus species NOT DETECTED NOT DETECTED Final   Salmonella species NOT DETECTED NOT DETECTED Final   Serratia marcescens NOT DETECTED NOT DETECTED Final   Haemophilus influenzae NOT DETECTED NOT DETECTED Final   Neisseria meningitidis NOT DETECTED NOT DETECTED Final   Pseudomonas aeruginosa NOT DETECTED NOT DETECTED Final   Stenotrophomonas maltophilia NOT DETECTED NOT DETECTED Final   Candida albicans NOT DETECTED NOT DETECTED Final   Candida auris NOT DETECTED NOT DETECTED Final   Candida glabrata NOT DETECTED NOT DETECTED Final   Candida krusei NOT DETECTED NOT DETECTED Final   Candida parapsilosis NOT DETECTED NOT DETECTED Final   Candida tropicalis NOT DETECTED NOT DETECTED Final   Cryptococcus neoformans/gattii NOT DETECTED NOT DETECTED Final   Meth resistant mecA/C and MREJ DETECTED (A) NOT DETECTED Final    Comment: CRITICAL RESULT CALLED TO, READ BACK BY AND VERIFIED WITH: PHARMD M.MACCIA AT 0750 ON 06/13/2022 BY T.SAAD. Performed at [facility], [place] [address], [place], [place] [address]   Culture, blood (Routine X 2) w Reflex to ID Panel     Status: None   Collection Time: 06/14/22  1:08 AM   Specimen: BLOOD  Result Value Ref Range Status   Specimen Description BLOOD RIGHT ANTECUBITAL  Final   Special Requests   Final    BOTTLES DRAWN AEROBIC ONLY Blood Culture adequate volume   Culture   Final    NO GROWTH 5 DAYS Performed at [facility], [place] [address], [place], [place] [address]    Report Status 06/19/2022 FINAL  Final  Culture, blood (Routine X 2) w Reflex to ID Panel     Status: None   Collection Time: 06/14/22  1:13 AM   Specimen: BLOOD RIGHT HAND  Result Value Ref Range Status   Specimen Description BLOOD RIGHT HAND  Final  Special Requests   Final    BOTTLES DRAWN AEROBIC AND  ANAEROBIC Blood Culture adequate volume   Culture   Final    NO GROWTH 5 DAYS Performed at [facility], [place] [address] [place], [place] [address]    Report Status 06/19/2022 FINAL  Final  Cath Tip Culture     Status: Abnormal   Collection Time: 06/14/22  3:23 PM   Specimen: Catheter Tip; Other  Result Value Ref Range Status   Specimen Description CATH TIP  Final   Special Requests   Final    NONE Performed at [facility], [place] [address] [place], [place] [address]    Culture (A)  Final    20,000 COLONIES/mL METHICILLIN RESISTANT STAPHYLOCOCCUS AUREUS   Report Status 06/17/2022 FINAL  Final   Organism ID, Bacteria METHICILLIN RESISTANT STAPHYLOCOCCUS AUREUS (A)  Final      Susceptibility   Methicillin resistant staphylococcus aureus - MIC*    CIPROFLOXACIN <=0.5 SENSITIVE Sensitive     ERYTHROMYCIN <=0.25 SENSITIVE Sensitive     GENTAMICIN <=0.5 SENSITIVE Sensitive     OXACILLIN RESISTANT Resistant     TETRACYCLINE >=16 RESISTANT Resistant     VANCOMYCIN 1 SENSITIVE Sensitive     TRIMETH/SULFA <=10 SENSITIVE Sensitive     CLINDAMYCIN <=0.25 SENSITIVE Sensitive     RIFAMPIN <=0.5 SENSITIVE Sensitive     Inducible Clindamycin NEGATIVE Sensitive     * 20,000 COLONIES/mL METHICILLIN RESISTANT STAPHYLOCOCCUS AUREUS  MRSA Next Gen by PCR, Nasal     Status: Abnormal   Collection Time: 06/15/22  8:17 AM   Specimen: Nasal Mucosa; Nasal Swab  Result Value Ref Range Status   MRSA by PCR Next Gen DETECTED (A) NOT DETECTED Final    Comment: RESULT CALLED TO, READ BACK BY AND VERIFIED WITH: RN Nathanial Rancher [phone] AT 1203 BY EC (NOTE) The GeneXpert MRSA Assay (FDA approved for NASAL specimens only), is one component of a comprehensive MRSA colonization surveillance program. It is not intended to diagnose MRSA infection nor to guide or monitor treatment for MRSA infections. Test performance is not FDA approved in patients less than 3 years old. Performed at [facility], [place] [address] [place], [place] [address]   Culture, blood (Routine X 2) w Reflex to ID Panel     Status: None   Collection Time: 06/15/22 10:20 AM   Specimen: BLOOD  Result Value Ref Range Status   Specimen Description BLOOD BLOOD RIGHT HAND  Final   Special Requests AEROBIC BOTTLE ONLY Blood Culture adequate volume  Final   Culture   Final    NO GROWTH 5 DAYS Performed at [facility], [place] [address], [place], [place] [address]    Report Status 06/20/2022 FINAL  Final  Culture, blood (Routine X 2) w Reflex to ID Panel     Status: None   Collection Time: 06/15/22 10:21 AM   Specimen: BLOOD  Result Value Ref Range Status   Specimen Description BLOOD BLOOD RIGHT WRIST  Final   Special Requests AEROBIC BOTTLE ONLY Blood Culture adequate volume  Final   Culture   Final    NO GROWTH 5 DAYS Performed at [facility], [place] [address], [place], [place] [address]    Report Status 06/20/2022 FINAL  Final    Radiology Studies: No results found.    Phillips Climes MD [facility]  If 7PM-7AM, please contact night-coverage [email or web address] 06/21/2022, 12:57 PM

## 2022-06-21 NOTE — Progress Notes (Signed)
Initial Nutrition Assessment  DOCUMENTATION CODES:   Non-severe (moderate) malnutrition in context of chronic illness  INTERVENTION:   Boost Breeze po TID, each supplement provides 250 kcal and 9 grams of protein.  Magic cup BID with meals, each supplement provides 290 kcal and 9 grams of protein.  Renal MVI daily.  NUTRITION DIAGNOSIS:   Moderate Malnutrition related to chronic illness (ESRD on HD) as evidenced by mild muscle depletion, moderate muscle depletion, mild fat depletion, moderate fat depletion.  GOAL:   Patient will meet greater than or equal to 90% of their needs  MONITOR:   PO intake, Supplement acceptance, Labs, Skin  REASON FOR ASSESSMENT:   Consult Assessment of nutrition requirement/status  ASSESSMENT:   71 yo female admitted with MRSA bacteremia from HD catheter, gallbladder mass suspicious for cholangiocarcinoma. PMH includes ESRD on HD, HTN, asthma, gout, dysrhythmia, A flutter with RVR, anemia.  TDC was removed. Now using AVF for HD. Plans for gallbladder removal later this week.   Patient states that she has been eating poorly, but she doesn't know why. She was 175 lbs when she started dialysis and now she's down to 135 lbs. She says she's lost all of her muscles. She does not like Ensure/Boost/Nepro supplements because they give her diarrhea. Agreed to try Boost Breeze and magic cups. Breakfast tray at bedside, 100% milk consumed, only a few bites taken of the solid foods. She is currently eating an apple that OT helped her cut into slices.   Labs reviewed. Na 133, phos 5.4  Medications reviewed and include Aranesp, Protonix, Renvela.   Weight history reviewed. No significant weight changes noted over the past 4 months. She has lost 15% of her usual weight within the past 9 months.  Patient meets criteria for moderate malnutrition, given mild-moderate depletion of muscle and subcutaneous fat mass.  NUTRITION - FOCUSED PHYSICAL  EXAM:  Flowsheet Row Most Recent Value  Orbital Region Moderate depletion  Upper Arm Region Mild depletion  Thoracic and Lumbar Region Mild depletion  Buccal Region Mild depletion  Temple Region Moderate depletion  Clavicle Bone Region Moderate depletion  Clavicle and Acromion Bone Region Moderate depletion  Scapular Bone Region Moderate depletion  Dorsal Hand Severe depletion  Patellar Region Mild depletion  Anterior Thigh Region Mild depletion  Posterior Calf Region Mild depletion  Edema (RD Assessment) Mild  Hair Reviewed  Eyes Reviewed  Mouth Unable to assess  Skin Reviewed  Nails Reviewed       Diet Order:   Diet Order             Diet renal with fluid restriction Fluid restriction: 1200 mL Fluid; Room service appropriate? Yes; Fluid consistency: Thin  Diet effective now                   EDUCATION NEEDS:   Not appropriate for education at this time  Skin:  Skin Assessment: Skin Integrity Issues: Skin Integrity Issues:: Stage II Stage II: coccyx  Last BM:  7/14  Height:   Ht Readings from Last 1 Encounters:  06/14/22 '5\' 7"'$  (1.702 m)    Weight:   Wt Readings from Last 1 Encounters:  06/21/22 59.2 kg    Ideal Body Weight:  61.4 kg  BMI:  Body mass index is 20.44 kg/m.  Estimated Nutritional Needs:   Kcal:  1900-2100  Protein:  85-95 gm  Fluid:  1 L + UOP   Lucas Mallow RD, LDN, CNSC Please refer to Amion for contact information.

## 2022-06-21 NOTE — Progress Notes (Signed)
General Surgery  I met with patient at bedside this morning to discuss gallbladder surgery again. I have not been able to reach her son on multiple attempts. 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 I again reviewed the details of the plan surgery, which will include a staging laparoscopy and open radical cholecystectomy, with possible bile duct resection and reconstruction if cystic duct margin is positive for malignancy. I will schedule her surgery this coming Friday morning, 06/25/22. Please hold Eliquis for 72 hours prior to surgery (stop after tomorrow's doses). Ok for heparin gtt if patient needs anticoagulation, will discontinue at least 6 hours prior to surgery.  Michaelle Birks, MD [facility] General, Hepatobiliary and Pancreatic Surgery 06/21/22 9:46 AM

## 2022-06-21 NOTE — Progress Notes (Signed)
Physical Therapy Treatment Patient Details Name: Diamond [name] MRN: [id number] DOB: 07/17/1951 Today's Date: 06/21/2022   History of Present Illness Pt is a 71 y/o female admitted secondary to abdominal pain, vomiting and missed HD. Generalized abdominal pain suspect related to gallbladder mass seen on CT scan. Pt also found to have MRSA and acute metabolic encephalopathy. PMH including but not limited to ESRD, HTN and a-flutter. Planning for TEE on 7/13.    PT Comments    Pt making slow, steady progress with functional mobility. She was able to tolerate transfers with min A x2 and took a few side steps at EOB with RW and min A x2. Continue to recommend further intensive therapy services at a SNF prior to returning home. Pt would continue to benefit from skilled physical therapy services at this time while admitted and after d/c to address the below listed limitations in order to improve overall safety and independence with functional mobility.  Of note, planning for surgery (staging laparoscopy and open radical cholecystectomy) at the end of this week (7/21).    Recommendations for follow up therapy are one component of a multi-disciplinary discharge planning process, led by the attending physician.  Recommendations may be updated based on patient status, additional functional criteria and insurance authorization.  Follow Up Recommendations  Skilled nursing-short term rehab (<3 hours/day) Can patient physically be transported by private vehicle: No   Assistance Recommended at Discharge Frequent or constant Supervision/Assistance  Patient can return home with the following Two people to help with walking and/or transfers;Two people to help with bathing/dressing/bathroom;Assistance with cooking/housework;Assist for transportation;Help with stairs or ramp for entrance   Equipment Recommendations  None recommended by PT    Recommendations for Other Services       Precautions /  Restrictions Precautions Precautions: Fall Precaution Comments: reported 2 recent falls Restrictions Weight Bearing Restrictions: No     Mobility  Bed Mobility Overal bed mobility: Needs Assistance Bed Mobility: Rolling, Supine to Sit, Sit to Supine Rolling: Supervision   Supine to sit: Min assist, HOB elevated Sit to supine: Mod assist   General bed mobility comments: pt rolling bilaterally in the bed in an attempt to get comfortable prior to sitting up; min A to assist with trunk elevation to achieve an upright sitting position; mod A to return bilateral LEs onto bed    Transfers Overall transfer level: Needs assistance Equipment used: Rolling walker (2 wheels) Transfers: Sit to/from Stand Sit to Stand: Min assist, +2 safety/equipment, +2 physical assistance           General transfer comment: cueing for safe hand placement and technique with RW, assistance for stability    Ambulation/Gait Ambulation/Gait assistance: Min assist, +2 safety/equipment   Assistive device: Rolling walker (2 wheels)         General Gait Details: pt able to take 4-5 small side steps at EOB with RW and min A x2 for stability   Stairs             Wheelchair Mobility    Modified Rankin (Stroke Patients Only)       Balance Overall balance assessment: Needs assistance Sitting-balance support: Feet supported, Single extremity supported, Bilateral upper extremity supported Sitting balance-Leahy Scale: Poor   Postural control: Right lateral lean Standing balance support: During functional activity, Reliant on assistive device for balance Standing balance-Leahy Scale: Poor  Cognition Arousal/Alertness: Awake/alert Behavior During Therapy: Flat affect Overall Cognitive Status: Impaired/Different from baseline Area of Impairment: Memory, Following commands, Safety/judgement, Awareness, Problem solving                      Memory: Decreased short-term memory Following Commands: Follows one step commands with increased time Safety/Judgement: Decreased awareness of deficits, Decreased awareness of safety Awareness: Emergent Problem Solving: Slow processing, Decreased initiation, Difficulty sequencing, Requires verbal cues, Requires tactile cues          Exercises      General Comments        Pertinent Vitals/Pain Pain Assessment Pain Assessment: Faces Faces Pain Scale: Hurts even more Pain Location: lower back Pain Descriptors / Indicators: Grimacing, Guarding Pain Intervention(s): Monitored during session, Repositioned    Home Living                          Prior Function            PT Goals (current goals can now be found in the care plan section) Acute Rehab PT Goals PT Goal Formulation: With patient/family Time For Goal Achievement: 07/01/22 Potential to Achieve Goals: Fair Progress towards PT goals: Progressing toward goals    Frequency    Min 2X/week      PT Plan Current plan remains appropriate    Co-evaluation PT/OT/SLP Co-Evaluation/Treatment: Yes Reason for Co-Treatment: For patient/therapist safety;To address functional/ADL transfers PT goals addressed during session: Mobility/safety with mobility;Balance;Proper use of DME;Strengthening/ROM        AM-PAC PT "6 Clicks" Mobility   Outcome Measure  Help needed turning from your back to your side while in a flat bed without using bedrails?: None Help needed moving from lying on your back to sitting on the side of a flat bed without using bedrails?: A Little Help needed moving to and from a bed to a chair (including a wheelchair)?: A Lot Help needed standing up from a chair using your arms (e.g., wheelchair or bedside chair)?: A Lot Help needed to walk in hospital room?: A Lot Help needed climbing 3-5 steps with a railing? : Total 6 Click Score: 14    End of Session   Activity Tolerance: Patient  limited by fatigue Patient left: in bed;with call bell/phone within reach;with bed alarm set Nurse Communication: Mobility status PT Visit Diagnosis: Other abnormalities of gait and mobility (R26.89);Muscle weakness (generalized) (M62.81)     Time: 1239-1310 PT Time Calculation (min) (ACUTE ONLY): 31 min  Charges:  $Therapeutic Activity: 8-22 mins                     Anastasio Champion, DPT  Acute Rehabilitation Services Office [place] 06/21/2022, 1:21 PM

## 2022-06-22 DIAGNOSIS — E440 Moderate protein-calorie malnutrition: Secondary | ICD-10-CM | POA: Insufficient documentation

## 2022-06-22 DIAGNOSIS — R7881 Bacteremia: Secondary | ICD-10-CM | POA: Diagnosis not present

## 2022-06-22 DIAGNOSIS — B9562 Methicillin resistant Staphylococcus aureus infection as the cause of diseases classified elsewhere: Secondary | ICD-10-CM | POA: Diagnosis not present

## 2022-06-22 DIAGNOSIS — N186 End stage renal disease: Secondary | ICD-10-CM | POA: Diagnosis not present

## 2022-06-22 DIAGNOSIS — K828 Other specified diseases of gallbladder: Secondary | ICD-10-CM | POA: Diagnosis not present

## 2022-06-22 LAB — RENAL FUNCTION PANEL
Albumin: 2.5 g/dL — ABNORMAL LOW (ref 3.5–5.0)
Anion gap: 11 (ref 5–15)
BUN: 39 mg/dL — ABNORMAL HIGH (ref 8–23)
CO2: 27 mmol/L (ref 22–32)
Calcium: 9.3 mg/dL (ref 8.9–10.3)
Chloride: 97 mmol/L — ABNORMAL LOW (ref 98–111)
Creatinine, Ser: 7.7 mg/dL — ABNORMAL HIGH (ref 0.44–1.00)
GFR, Estimated: 5 mL/min — ABNORMAL LOW (ref >60)
Glucose, Bld: 104 mg/dL — ABNORMAL HIGH (ref 70–99)
Phosphorus: 6.5 mg/dL — ABNORMAL HIGH (ref 2.5–4.6)
Potassium: 5 mmol/L (ref 3.5–5.1)
Sodium: 135 mmol/L (ref 135–145)

## 2022-06-22 LAB — CBC
HCT: 30 % — ABNORMAL LOW (ref 36.0–46.0)
Hemoglobin: 9.8 g/dL — ABNORMAL LOW (ref 12.0–15.0)
MCH: 31.6 pg (ref 26.0–34.0)
MCHC: 32.7 g/dL (ref 30.0–36.0)
MCV: 96.8 fL (ref 80.0–100.0)
Platelets: 212 10*3/uL (ref 150–400)
RBC: 3.1 MIL/uL — ABNORMAL LOW (ref 3.87–5.11)
RDW: 19 % — ABNORMAL HIGH (ref 11.5–15.5)
WBC: 4.3 10*3/uL (ref 4.0–10.5)
nRBC: 0 % (ref 0.0–0.2)

## 2022-06-22 LAB — HEPARIN LEVEL (UNFRACTIONATED)
Heparin Unfractionated: >1.1 IU/mL — ABNORMAL HIGH (ref 0.30–0.70)
Heparin Unfractionated: >1.1 IU/mL — ABNORMAL HIGH (ref 0.30–0.70)

## 2022-06-22 LAB — APTT
aPTT: 44 seconds — ABNORMAL HIGH (ref 24–36)
aPTT: 71 seconds — ABNORMAL HIGH (ref 24–36)

## 2022-06-22 MED ORDER — HEPARIN (PORCINE) 25000 UT/250ML-% IV SOLN
900.0000 [IU]/h | INTRAVENOUS | Status: DC
  Administered 2022-06-22: 850 [IU]/h via INTRAVENOUS

## 2022-06-22 NOTE — Procedures (Addendum)
Received patient in bed, alert and oriented. Informed consent signed and in chart.  Time tx completed: 3 hours 30 minutes  HD treatment completed. Patient tolerated well. Fistula/Graft without signs and symptoms of complications. Patient transported back to the room, alert and orient and in no acute distress. Report given to bedside RN, informed RN pt to start heparin drip per pharmacy consult.   Total UF removed: 1000  Medication given: aranesp, vancomycin  Post HD VS: BP 149/82 MAP 104 HR 85 RR 210 Sat 98% RA Temp 98.6 oral   Post HD weight: 59.6 kg

## 2022-06-22 NOTE — Procedures (Signed)
I was present at this dialysis session. I have reviewed the session itself and made appropriate changes.   Filed Weights   06/20/22 0500 06/21/22 0500 06/22/22 0800  Weight: 59.4 kg 59.2 kg 59.6 kg    Recent Labs  Lab 06/21/22 0643  NA 133*  K 4.7  CL 97*  CO2 25  GLUCOSE 89  BUN 28*  CREATININE 5.40*  CALCIUM 9.4  PHOS 5.4*    Recent Labs  Lab 06/18/22 0149 06/19/22 0911 06/22/22 0511  WBC 5.7 5.8 4.3  HGB 10.2* 9.5* 9.8*  HCT 30.3* 28.4* 30.0*  MCV 95.3 95.6 96.8  PLT 214 225 212    Scheduled Meds:  Chlorhexidine Gluconate Cloth  6 each Topical Q0600   Chlorhexidine Gluconate Cloth  6 each Topical Q0600   darbepoetin (ARANESP) injection - DIALYSIS  150 mcg Intravenous Q Tue-HD   diltiazem  240 mg Oral Daily   feeding supplement  1 Container Oral TID BM   multivitamin  1 tablet Oral QHS   pantoprazole  40 mg Oral BID   sevelamer carbonate  1,600 mg Oral TID WC   Continuous Infusions:  vancomycin 500 mg (06/22/22 0959)   PRN Meds:.acetaminophen, acetaminophen, diltiazem, HYDROmorphone (DILAUDID) injection, labetalol, lidocaine HCl (PF), lip balm, mouth rinse, oxyCODONE, prochlorperazine   Santiago Bumpers,  MD 06/22/2022, 10:57 AM

## 2022-06-22 NOTE — Progress Notes (Signed)
PROGRESS NOTE  Diamond [name] QMG:[id number] DOB: 1951/01/05   PCP: [name], MD  Patient is from: Home.  Lives alone.  Uses walker at baseline.  DOA: 06/12/2022 LOS: 10  Chief complaints Nausea, vomiting and diarrhea    Brief Narrative / Interim history: 71 year old F with PMH of ESRD on HD TTS, prior MRSA bacteremia with L5-S1 discitis and endocarditis, gallbladder mass, ACD, HTN and paroxysmal A-fib presenting with nausea, vomiting and diarrhea, and found to have MRSA bacteremia likely from dialysis catheter.  CT abdomen and pelvis also showed previously known gallbladder mass suspicious for cholangiocarcinoma.  Patient was started on IV vancomycin.  ID, nephrology and general surgery consulted.   Dialysis catheter removed.  Repeat blood culture NGTD.  TEE negative for vegetation but similar very small mobile echodensity seen in RVOT which was better visualized on prior TTE on 03/03/2022.  ID recommends vancomycin with HD for 4 weeks with end date of 07/13/2022.  General surgery planning to take patient to the OR for gallbladder mass later next week.  Hospital course noteworthy of acute metabolic encephalopathy and paroxysmal A-fib with RVR, now resolved.  Subjective:  No significant events overnight, she reports some intermittent right upper abdominal pain.   Objective: Vitals:   06/22/22 1030 06/22/22 1100 06/22/22 1130 06/22/22 1154  BP: 140/83 (!) 145/82 (!) 145/90 (!) 149/82  Pulse: 80 80 82 85  Resp: '17 16 16 16  '$ Temp:    98.6 F (37 C)  TempSrc:    Oral  SpO2: 99% 100% 100% 98%  Weight:    59.6 kg  Height:        Examination:  Awake Alert, frail. Symmetrical Chest wall movement, Good air movement bilaterally, CTAB RRR,No Gallops,Rubs or new Murmurs, No Parasternal Heave +ve B.Sounds, Abd Soft, No tenderness, No rebound - guarding or rigidity. No Cyanosis, Clubbing or edema, No new Rash or bruise     Procedures:  7/13-TEE.  Microbiology  summarized: 7/8-COVID-19 and influenza PCR nonreactive. 7/8-blood culture with MRSA 7/10-catheter tip culture with MRSA. 7/10-repeat blood culture NGTD 7/11-repeat blood culture NGTD  Assessment and plan: Principal Problem:   MRSA bacteremia Active Problems:   Gallbladder mass   Hyperkalemia   Hypertension   ESRD on dialysis ([place])   Paroxysmal atrial flutter ([facility])   Acute metabolic encephalopathy   Hyponatremia   Hypokalemia   Lower back pain   Pressure injury of skin   Physical deconditioning   Malnutrition of moderate degree  MRSA bacteremia: Thought to be from HD cath.  Previous history of MRSA bacteremia and L5-S1 discitis and endocarditis about 6 months ago.  TDC removed on 7/10.  Catheter tip culture with MRSA.  TEE negative for vegetation but but similar very small mobile echodensity seen in RVOT which was better visualized on prior TTE on 03/03/2022.  Repeat blood culture on 7/10 and 7/11 NGTD. -ID recommended vancomycin with HD and total 07/13/2022.   Known history of gallbladder mass-lost to follow-up outpatient. -Management per general surgery, plan to take the OR this admission, surgery is this Friday. -Was on hold in anticipation of surgery, currently on heparin GTT, heparin to be held 6 hours before surgery this coming Friday.  ESRD on HD MWF: TDC removed due to bacteremia.  Using aVF for HD. Anemia of renal disease: Stable Hyponatremia/hyperkalemia/hypokalemia -Per nephrology  Essential hypertension: Normotensive. -Continue Cardizem   Paroxysmal atrial flutter/A-fib with RVR: RVR seems to have resolved.  -Consolidate to Cardizem CD 240 mg daily with short acting Cardizem  30 mg every 6h as needed -Continue Eliquis for anticoagulation   Acute metabolic encephalopathy: Seems to have resolved.  Oriented x4. -Reorientation and delirium precautions -Minimize sedating medication  Chronic back pain: CT abdomen and pelvis showed degenerative changes.  Pain seems to  have resolved. -Continue Tylenol as needed  Physical deconditioning -PT/OT-recommended SNF but patient hopes to return home   Increased nutrient needs Body mass index is 20.58 kg/m. -Consult dietitian  Pressure ulcer stage II at the coccyx. Pressure Injury Coccyx Stage 2 -  Partial thickness loss of dermis presenting as a shallow open injury with a red, pink wound bed without slough. (Active)     Location: Coccyx  Location Orientation:   Staging: Stage 2 -  Partial thickness loss of dermis presenting as a shallow open injury with a red, pink wound bed without slough.  Wound Description (Comments):   Present on Admission: Yes  Dressing Type Foam - Lift dressing to assess site every shift 06/22/22 0725   DVT prophylaxis:  Eliquis>> heparin GTT  Code Status: Full code Family Communication: None at bedside Level of care: Telemetry Medical Status is: Inpatient Remains inpatient appropriate because: Due to MRSA bacteremia and gallbladder mass   Final disposition: TBD Consultants:  Cardiology Infectious disease Nephrology Vascular surgery Interventional radiology General surgery  Sch Meds:  Scheduled Meds:  Chlorhexidine Gluconate Cloth  6 each Topical Q0600   Chlorhexidine Gluconate Cloth  6 each Topical Q0600   darbepoetin (ARANESP) injection - DIALYSIS  150 mcg Intravenous Q Tue-HD   diltiazem  240 mg Oral Daily   feeding supplement  1 Container Oral TID BM   multivitamin  1 tablet Oral QHS   pantoprazole  40 mg Oral BID   sevelamer carbonate  1,600 mg Oral TID WC   Continuous Infusions:  heparin     vancomycin Stopped (06/22/22 1100)   PRN Meds:.acetaminophen, acetaminophen, diltiazem, HYDROmorphone (DILAUDID) injection, labetalol, lidocaine HCl (PF), lip balm, mouth rinse, oxyCODONE, prochlorperazine  Antimicrobials: Anti-infectives (From admission, onward)    Start     Dose/Rate Route Frequency Ordered Stop   06/15/22 1200  vancomycin (VANCOREADY) IVPB 500  mg/100 mL        500 mg 100 mL/hr over 60 Minutes Intravenous Every T-Th-Sa (Hemodialysis) 06/15/22 1028 07/13/22 2359   06/14/22 1200  vancomycin (VANCOREADY) IVPB 500 mg/100 mL        500 mg 100 mL/hr over 60 Minutes Intravenous Every M-W-F (Hemodialysis) 06/14/22 0814 06/14/22 1714   06/13/22 1130  cefTRIAXone (ROCEPHIN) 2 g in sodium chloride 0.9 % 100 mL IVPB  Status:  Discontinued        2 g 200 mL/hr over 30 Minutes Intravenous Every 24 hours 06/13/22 1118 06/13/22 1249   06/13/22 0800  vancomycin (VANCOREADY) IVPB 1250 mg/250 mL        1,250 mg 166.7 mL/hr over 90 Minutes Intravenous  Once 06/13/22 0757 06/13/22 1223   06/13/22 0757  vancomycin variable dose per unstable renal function (pharmacist dosing)  Status:  Discontinued         Does not apply See admin instructions 06/13/22 0757 06/15/22 1029        I have personally reviewed the following labs and images: CBC: Recent Labs  Lab 06/16/22 0702 06/17/22 0107 06/18/22 0149 06/19/22 0911 06/22/22 0511  WBC 5.3 6.0 5.7 5.8 4.3  HGB 10.1* 10.1* 10.2* 9.5* 9.8*  HCT 29.8* 28.9* 30.3* 28.4* 30.0*  MCV 96.1 92.9 95.3 95.6 96.8  PLT 175 196 214 225  212   BMP &GFR Recent Labs  Lab 06/17/22 0107 06/18/22 0149 06/19/22 1130 06/21/22 0643 06/22/22 0800  NA 129* 133* 126* 133* 135  K 3.8 3.3* 3.9 4.7 5.0  CL 91* 93* 85* 97* 97*  CO2 '25 29 27 25 27  '$ GLUCOSE 104* 110* 83 89 104*  BUN 29* 17 28* 28* 39*  CREATININE 5.48* 3.93* 5.61* 5.40* 7.70*  CALCIUM 8.9 8.6* 9.1 9.4 9.3  MG 1.5* 1.5*  --   --   --   PHOS  --   --  4.1 5.4* 6.5*   Estimated Creatinine Clearance: 6.2 mL/min (A) (by C-G formula based on SCr of 7.7 mg/dL (H)). Liver & Pancreas: Recent Labs  Lab 06/19/22 1130 06/21/22 0643 06/22/22 0800  ALBUMIN 2.1* 2.4* 2.5*   No results for input(s): "LIPASE", "AMYLASE" in the last 168 hours.  No results for input(s): "AMMONIA" in the last 168 hours.  Diabetic: No results for input(s): "HGBA1C" in the  last 72 hours. Recent Labs  Lab 06/15/22 2047  GLUCAP 150*   Cardiac Enzymes: No results for input(s): "CKTOTAL", "CKMB", "CKMBINDEX", "TROPONINI" in the last 168 hours. No results for input(s): "PROBNP" in the last 8760 hours. Coagulation Profile: No results for input(s): "INR", "PROTIME" in the last 168 hours.  Thyroid Function Tests: No results for input(s): "TSH", "T4TOTAL", "FREET4", "T3FREE", "THYROIDAB" in the last 72 hours. Lipid Profile: No results for input(s): "CHOL", "HDL", "LDLCALC", "TRIG", "CHOLHDL", "LDLDIRECT" in the last 72 hours. Anemia Panel: No results for input(s): "VITAMINB12", "FOLATE", "FERRITIN", "TIBC", "IRON", "RETICCTPCT" in the last 72 hours. Urine analysis:    Component Value Date/Time   COLORURINE STRAW (A) 06/12/2022 2040   APPEARANCEUR CLEAR 06/12/2022 2040   LABSPEC 1.006 06/12/2022 2040   PHURINE 7.0 06/12/2022 2040   GLUCOSEU NEGATIVE 06/12/2022 2040   HGBUR SMALL (A) 06/12/2022 2040   BILIRUBINUR NEGATIVE 06/12/2022 2040   KETONESUR NEGATIVE 06/12/2022 2040   PROTEINUR 30 (A) 06/12/2022 2040   UROBILINOGEN 0.2 09/27/2021 1329   NITRITE NEGATIVE 06/12/2022 2040   LEUKOCYTESUR NEGATIVE 06/12/2022 2040   Sepsis Labs: Invalid input(s): "PROCALCITONIN", "LACTICIDVEN"  Microbiology: Recent Results (from the past 240 hour(s))  Resp Panel by RT-PCR (Flu A&B, Covid) Anterior Nasal Swab     Status: None   Collection Time: 06/12/22  3:08 PM   Specimen: Anterior Nasal Swab  Result Value Ref Range Status   SARS Coronavirus 2 by RT PCR NEGATIVE NEGATIVE Final    Comment: (NOTE) SARS-CoV-2 target nucleic acids are NOT DETECTED.  The SARS-CoV-2 RNA is generally detectable in upper respiratory specimens during the acute phase of infection. The lowest concentration of SARS-CoV-2 viral copies this assay can detect is 138 copies/mL. A negative result does not preclude SARS-Cov-2 infection and should not be used as the sole basis for treatment  or other patient management decisions. A negative result may occur with  improper specimen collection/handling, submission of specimen other than nasopharyngeal swab, presence of viral mutation(s) within the areas targeted by this assay, and inadequate number of viral copies(<138 copies/mL). A negative result must be combined with clinical observations, patient history, and epidemiological information. The expected result is Negative.  Fact Sheet for Patients:  EntrepreneurPulse.com.au  Fact Sheet for Healthcare Providers:  IncredibleEmployment.be  This test is no t yet approved or cleared by the [place] FDA and  has been authorized for detection and/or diagnosis of SARS-CoV-2 by FDA under an Emergency Use Authorization (EUA). This EUA will remain  in effect (meaning  this test can be used) for the duration of the COVID-19 declaration under Section 564(b)(1) of the Act, 21 U.S.C.section 360bbb-3(b)(1), unless the authorization is terminated  or revoked sooner.       Influenza A by PCR NEGATIVE NEGATIVE Final   Influenza B by PCR NEGATIVE NEGATIVE Final    Comment: (NOTE) The Xpert Xpress SARS-CoV-2/FLU/RSV plus assay is intended as an aid in the diagnosis of influenza from Nasopharyngeal swab specimens and should not be used as a sole basis for treatment. Nasal washings and aspirates are unacceptable for Xpert Xpress SARS-CoV-2/FLU/RSV testing.  Fact Sheet for Patients: EntrepreneurPulse.com.au  Fact Sheet for Healthcare Providers: IncredibleEmployment.be  This test is not yet approved or cleared by the [place] FDA and has been authorized for detection and/or diagnosis of SARS-CoV-2 by FDA under an Emergency Use Authorization (EUA). This EUA will remain in effect (meaning this test can be used) for the duration of the COVID-19 declaration under Section 564(b)(1) of the Act, 21 U.S.C. section  360bbb-3(b)(1), unless the authorization is terminated or revoked.  Performed at [facility], [place] [address], [place], [place] [address]   Blood Culture (routine x 2)     Status: Abnormal   Collection Time: 06/12/22  3:11 PM   Specimen: BLOOD LEFT HAND  Result Value Ref Range Status   Specimen Description BLOOD LEFT HAND  Final   Special Requests   Final    BOTTLES DRAWN AEROBIC ONLY Blood Culture results may not be optimal due to an inadequate volume of blood received in culture bottles   Culture  Setup Time   Final    GRAM POSITIVE COCCI IN CLUSTERS AEROBIC BOTTLE ONLY CRITICAL VALUE NOTED.  VALUE IS CONSISTENT WITH PREVIOUSLY REPORTED AND CALLED VALUE.    Culture (A)  Final    STAPHYLOCOCCUS AUREUS SUSCEPTIBILITIES PERFORMED ON PREVIOUS CULTURE WITHIN THE LAST 5 DAYS. Performed at [facility], [place] [address], [place], [place] [address]    Report Status 06/15/2022 FINAL  Final  Blood Culture (routine x 2)     Status: Abnormal   Collection Time: 06/12/22  3:45 PM   Specimen: BLOOD  Result Value Ref Range Status   Specimen Description BLOOD SITE NOT SPECIFIED  Final   Special Requests   Final    BOTTLES DRAWN AEROBIC AND ANAEROBIC Blood Culture results may not be optimal due to an inadequate volume of blood received in culture bottles   Culture  Setup Time   Final    GRAM POSITIVE COCCI IN BOTH AEROBIC AND ANAEROBIC BOTTLES CRITICAL RESULT CALLED TO, READ BACK BY AND VERIFIED WITH: PHARMD M.MACCIA AT 2585 ON 06/13/2022 BY T.SAAD. Performed at [facility], [place] [address], [place], [place] [address]    Culture METHICILLIN RESISTANT STAPHYLOCOCCUS AUREUS (A)  Final   Report Status 06/15/2022 FINAL  Final   Organism ID, Bacteria METHICILLIN RESISTANT STAPHYLOCOCCUS AUREUS  Final      Susceptibility   Methicillin resistant staphylococcus aureus - MIC*    CIPROFLOXACIN <=0.5 SENSITIVE Sensitive     ERYTHROMYCIN <=0.25 SENSITIVE Sensitive     GENTAMICIN <=0.5  SENSITIVE Sensitive     OXACILLIN >=4 RESISTANT Resistant     TETRACYCLINE >=16 RESISTANT Resistant     VANCOMYCIN 1 SENSITIVE Sensitive     TRIMETH/SULFA <=10 SENSITIVE Sensitive     CLINDAMYCIN <=0.25 SENSITIVE Sensitive     RIFAMPIN <=0.5 SENSITIVE Sensitive     Inducible Clindamycin NEGATIVE Sensitive     *  METHICILLIN RESISTANT STAPHYLOCOCCUS AUREUS  Blood Culture ID Panel (Reflexed)     Status: Abnormal   Collection Time: 06/12/22  3:45 PM  Result Value Ref Range Status   Enterococcus faecalis NOT DETECTED NOT DETECTED Final   Enterococcus Faecium NOT DETECTED NOT DETECTED Final   Listeria monocytogenes NOT DETECTED NOT DETECTED Final   Staphylococcus species DETECTED (A) NOT DETECTED Final    Comment: CRITICAL RESULT CALLED TO, READ BACK BY AND VERIFIED WITH: PHARMD M.MACCIA AT 0750 ON 06/13/2022 BY T.SAAD.    Staphylococcus aureus (BCID) DETECTED (A) NOT DETECTED Final    Comment: Methicillin (oxacillin)-resistant Staphylococcus aureus (MRSA). MRSA is predictably resistant to beta-lactam antibiotics (except ceftaroline). Preferred therapy is vancomycin unless clinically contraindicated. Patient requires contact precautions if  hospitalized. CRITICAL RESULT CALLED TO, READ BACK BY AND VERIFIED WITH: PHARMD M.MACCIA AT 0750 ON 06/13/2022 BY T.SAAD.    Staphylococcus epidermidis NOT DETECTED NOT DETECTED Final   Staphylococcus lugdunensis NOT DETECTED NOT DETECTED Final   Streptococcus species NOT DETECTED NOT DETECTED Final   Streptococcus agalactiae NOT DETECTED NOT DETECTED Final   Streptococcus pneumoniae NOT DETECTED NOT DETECTED Final   Streptococcus pyogenes NOT DETECTED NOT DETECTED Final   A.calcoaceticus-baumannii NOT DETECTED NOT DETECTED Final   Bacteroides fragilis NOT DETECTED NOT DETECTED Final   Enterobacterales NOT DETECTED NOT DETECTED Final   Enterobacter cloacae complex NOT DETECTED NOT DETECTED Final   Escherichia coli NOT DETECTED NOT DETECTED Final    Klebsiella aerogenes NOT DETECTED NOT DETECTED Final   Klebsiella oxytoca NOT DETECTED NOT DETECTED Final   Klebsiella pneumoniae NOT DETECTED NOT DETECTED Final   Proteus species NOT DETECTED NOT DETECTED Final   Salmonella species NOT DETECTED NOT DETECTED Final   Serratia marcescens NOT DETECTED NOT DETECTED Final   Haemophilus influenzae NOT DETECTED NOT DETECTED Final   Neisseria meningitidis NOT DETECTED NOT DETECTED Final   Pseudomonas aeruginosa NOT DETECTED NOT DETECTED Final   Stenotrophomonas maltophilia NOT DETECTED NOT DETECTED Final   Candida albicans NOT DETECTED NOT DETECTED Final   Candida auris NOT DETECTED NOT DETECTED Final   Candida glabrata NOT DETECTED NOT DETECTED Final   Candida krusei NOT DETECTED NOT DETECTED Final   Candida parapsilosis NOT DETECTED NOT DETECTED Final   Candida tropicalis NOT DETECTED NOT DETECTED Final   Cryptococcus neoformans/gattii NOT DETECTED NOT DETECTED Final   Meth resistant mecA/C and MREJ DETECTED (A) NOT DETECTED Final    Comment: CRITICAL RESULT CALLED TO, READ BACK BY AND VERIFIED WITH: PHARMD M.MACCIA AT 0750 ON 06/13/2022 BY T.SAAD. Performed at [facility], [place] [address] [place], [place] [address]   Culture, blood (Routine X 2) w Reflex to ID Panel     Status: None   Collection Time: 06/14/22  1:08 AM   Specimen: BLOOD  Result Value Ref Range Status   Specimen Description BLOOD RIGHT ANTECUBITAL  Final   Special Requests   Final    BOTTLES DRAWN AEROBIC ONLY Blood Culture adequate volume   Culture   Final    NO GROWTH 5 DAYS Performed at [facility], [place] [address] [place],  [address]    Report Status 06/19/2022 FINAL  Final  Culture, blood (Routine X 2) w Reflex to ID Panel     Status: None   Collection Time: 06/14/22  1:13 AM   Specimen: BLOOD RIGHT HAND  Result Value Ref Range Status   Specimen Description BLOOD RIGHT HAND  Final   Special Requests   Final  BOTTLES DRAWN AEROBIC AND  ANAEROBIC Blood Culture adequate volume   Culture   Final    NO GROWTH 5 DAYS Performed at [facility], [place] [address], [place], [place] [address]    Report Status 06/19/2022 FINAL  Final  Cath Tip Culture     Status: Abnormal   Collection Time: 06/14/22  3:23 PM   Specimen: Catheter Tip; Other  Result Value Ref Range Status   Specimen Description CATH TIP  Final   Special Requests   Final    NONE Performed at [facility], [place] [address] [place], [place] [address]    Culture (A)  Final    20,000 COLONIES/mL METHICILLIN RESISTANT STAPHYLOCOCCUS AUREUS   Report Status 06/17/2022 FINAL  Final   Organism ID, Bacteria METHICILLIN RESISTANT STAPHYLOCOCCUS AUREUS (A)  Final      Susceptibility   Methicillin resistant staphylococcus aureus - MIC*    CIPROFLOXACIN <=0.5 SENSITIVE Sensitive     ERYTHROMYCIN <=0.25 SENSITIVE Sensitive     GENTAMICIN <=0.5 SENSITIVE Sensitive     OXACILLIN RESISTANT Resistant     TETRACYCLINE >=16 RESISTANT Resistant     VANCOMYCIN 1 SENSITIVE Sensitive     TRIMETH/SULFA <=10 SENSITIVE Sensitive     CLINDAMYCIN <=0.25 SENSITIVE Sensitive     RIFAMPIN <=0.5 SENSITIVE Sensitive     Inducible Clindamycin NEGATIVE Sensitive     * 20,000 COLONIES/mL METHICILLIN RESISTANT STAPHYLOCOCCUS AUREUS  MRSA Next Gen by PCR, Nasal     Status: Abnormal   Collection Time: 06/15/22  8:17 AM   Specimen: Nasal Mucosa; Nasal Swab  Result Value Ref Range Status   MRSA by PCR Next Gen DETECTED (A) NOT DETECTED Final    Comment: RESULT CALLED TO, READ BACK BY AND VERIFIED WITH: RN Nathanial Rancher [phone] AT 1203 BY EC (NOTE) The GeneXpert MRSA Assay (FDA approved for NASAL specimens only), is one component of a comprehensive MRSA colonization surveillance program. It is not intended to diagnose MRSA infection nor to guide or monitor treatment for MRSA infections. Test performance is not FDA approved in patients less than 11 years old. Performed at [facility], [place] [address] [place], [place] [address]   Culture, blood (Routine X 2) w Reflex to ID Panel     Status: None   Collection Time: 06/15/22 10:20 AM   Specimen: BLOOD  Result Value Ref Range Status   Specimen Description BLOOD BLOOD RIGHT HAND  Final   Special Requests AEROBIC BOTTLE ONLY Blood Culture adequate volume  Final   Culture   Final    NO GROWTH 5 DAYS Performed at [facility], [place] [address], [place], [place] [address]    Report Status 06/20/2022 FINAL  Final  Culture, blood (Routine X 2) w Reflex to ID Panel     Status: None   Collection Time: 06/15/22 10:21 AM   Specimen: BLOOD  Result Value Ref Range Status   Specimen Description BLOOD BLOOD RIGHT WRIST  Final   Special Requests AEROBIC BOTTLE ONLY Blood Culture adequate volume  Final   Culture   Final    NO GROWTH 5 DAYS Performed at [facility], [place] [address], [place], [place] [address]    Report Status 06/20/2022 FINAL  Final    Radiology Studies: No results found.    Phillips Climes MD [facility]  If 7PM-7AM, please contact night-coverage [email or web address] 06/22/2022, 2:45 PM

## 2022-06-22 NOTE — Progress Notes (Signed)
ANTICOAGULATION CONSULT NOTE - Initial Consult  Pharmacy Consult for heparin Indication: atrial fibrillation  Allergies  Allergen Reactions   Shellfish Allergy     Unknown reaction     Patient Measurements: Height: '5\' 7"'$  (170.2 cm) Weight: 59.6 kg (131 lb 6.3 oz) IBW/kg (Calculated) : 61.6 kg Heparin Dosing Weight: 59 kg  Vital Signs: Temp: 98.6 F (37 C) (07/18 1154) Temp Source: Oral (07/18 1154) BP: 149/82 (07/18 1154) Pulse Rate: 85 (07/18 1154)  Labs: Recent Labs    06/21/22 0643 06/22/22 0511 06/22/22 0800 06/22/22 1020  HGB  --  9.8*  --   --   HCT  --  30.0*  --   --   PLT  --  212  --   --   APTT  --   --   --  44*  HEPARINUNFRC  --   --   --  >1.10*  CREATININE 5.40*  --  7.70*  --     Estimated Creatinine Clearance: 6.2 mL/min (A) (by C-G formula based on SCr of 7.7 mg/dL (H)).   Medical History: Past Medical History:  Diagnosis Date   Anemia    Asthma    Atrial flutter with rapid ventricular response ([place]) 12/25/2021   Chronic kidney disease    dialysis Tues Thurs Sat   Dysrhythmia    PAF 10/2019 in setting of COVID-19 PNA   Gout    Hypertension     Medications:  Scheduled:   Chlorhexidine Gluconate Cloth  6 each Topical Q0600   Chlorhexidine Gluconate Cloth  6 each Topical Q0600   darbepoetin (ARANESP) injection - DIALYSIS  150 mcg Intravenous Q Tue-HD   diltiazem  240 mg Oral Daily   feeding supplement  1 Container Oral TID BM   multivitamin  1 tablet Oral QHS   pantoprazole  40 mg Oral BID   sevelamer carbonate  1,600 mg Oral TID WC    Assessment: 71 y.o. female with history of atrial fibrillation on Eliquis prior to admission. Eliquis was initially held 7/11 for procedure and patient was transitioned to IV heparin 7/11-7/12. On 7/12 patient was transitioned back to Eliquis. Pharmacy consulted on 7/18 to transition patient from Eliquis to IV heparin for upcoming procedure. Last dose of Eliquis was 7/17 2200. Will not load heparin due  to recent Eliquis use. Will monitor aPTTs due to apixaban's effect on elevated heparin levels, until heparin level and aPTT correlate. Patient's Hgb stable and platelets 212.  Goal of Therapy:  Heparin level 0.3-0.7 units/ml aPTT 66-102 seconds Monitor platelets by anticoagulation protocol: Yes   Plan:  Start heparin infusion at 850 units/hr Check anti-Xa and aPTT levels in 8 hours and daily while on heparin until correlate Continue to monitor H&H and platelets  Jeneen Rinks, Pharm.D PGY1 Pharmacy Resident  06/22/2022 1:00 PM

## 2022-06-22 NOTE — Progress Notes (Signed)
1307- Pt arrived to unit from HD. IV reassessed for heparin gtt. Pt stated that IV was painful during flush. Two failed attempts for IV access. IV consulted to assess current IVs.  1505- Pt states that IV does not hurt and she didn't mean her IV site hurts. She states it was her arm that hurt.  Heparin gtt started.

## 2022-06-22 NOTE — Progress Notes (Signed)
ANTICOAGULATION CONSULT NOTE  Pharmacy Consult for heparin Indication: atrial fibrillation  Allergies  Allergen Reactions   Shellfish Allergy     Unknown reaction     Patient Measurements: Height: '5\' 7"'$  (170.2 cm) Weight: 59.6 kg (131 lb 6.3 oz) IBW/kg (Calculated) : 61.6 kg Heparin Dosing Weight: 59 kg  Vital Signs: Temp: 98.6 F (37 C) (07/18 1652) Temp Source: Oral (07/18 1652) BP: 148/95 (07/18 1652) Pulse Rate: 95 (07/18 1652)  Labs: Recent Labs    06/21/22 0643 06/22/22 0511 06/22/22 0800 06/22/22 1020 06/22/22 2123  HGB  --  9.8*  --   --   --   HCT  --  30.0*  --   --   --   PLT  --  212  --   --   --   APTT  --   --   --  44* 71*  HEPARINUNFRC  --   --   --  >1.10* >1.10*  CREATININE 5.40*  --  7.70*  --   --      Estimated Creatinine Clearance: 6.2 mL/min (A) (by C-G formula based on SCr of 7.7 mg/dL (H)).   Assessment: 71 y.o. female with history of atrial fibrillation on Eliquis prior to admission. Eliquis was initially held 7/11 for procedure and patient was transitioned to IV heparin 7/11-7/12. On 7/12 patient was transitioned back to Eliquis. Pharmacy consulted on 7/18 to transition patient from Eliquis to IV heparin for upcoming procedure. Last dose of Eliquis was 7/17 2200. Will not load heparin due to recent Eliquis use. Will monitor aPTTs due to apixaban's effect on elevated heparin levels, until heparin level and aPTT correlate. Patient's Hgb stable and platelets 212.  aPTT is therapeutic at 71 sec (~6 hr level). No bleeding noted.   Goal of Therapy:  Heparin level 0.3-0.7 units/ml aPTT 66-102 seconds Monitor platelets by anticoagulation protocol: Yes   Plan:  Continue heparin infusion at 850 units/hr Check anti-Xa and aPTT levels daily while on heparin until correlate Continue to monitor H&H and platelets  Thank you for involving pharmacy in this patient's care.  Renold Genta, PharmD, BCPS Clinical Pharmacist Clinical phone for  06/22/2022 until 10p is [phone] 06/22/2022 10:00 PM

## 2022-06-22 NOTE — Progress Notes (Signed)
[facility] Progress Note   Subjective:    Seen and examined patient on HD. Currently running even and tolerating well. No acute issues.  Objective Vitals:   06/22/22 0930 06/22/22 1000 06/22/22 1030 06/22/22 1100  BP: 120/76 132/88 140/83 (!) 145/82  Pulse: 80 82 80 80  Resp: '17 16 17 16  '$ Temp:      TempSrc:      SpO2: 95% 97% 99% 100%  Weight:      Height:       Physical Exam General: Chronically ill-appearing; on RA; NAD Heart: S1 and S2; No murmurs, gallops, or rubs Lungs: Clear anteriorly and laterally Abdomen: Soft and non-tender Extremities: No edema BLLE Dialysis Access: L AVF (+) B/T   Filed Weights   06/20/22 0500 06/21/22 0500 06/22/22 0800  Weight: 59.4 kg 59.2 kg 59.6 kg    Intake/Output Summary (Last 24 hours) at 06/22/2022 1111 Last data filed at 06/22/2022 0400 Gross per 24 hour  Intake --  Output 750 ml  Net -750 ml    Additional Objective Labs: Basic Metabolic Panel: Recent Labs  Lab 06/18/22 0149 06/19/22 1130 06/21/22 0643  NA 133* 126* 133*  K 3.3* 3.9 4.7  CL 93* 85* 97*  CO2 '29 27 25  '$ GLUCOSE 110* 83 89  BUN 17 28* 28*  CREATININE 3.93* 5.61* 5.40*  CALCIUM 8.6* 9.1 9.4  PHOS  --  4.1 5.4*   Liver Function Tests: Recent Labs  Lab 06/19/22 1130 06/21/22 0643  ALBUMIN 2.1* 2.4*   No results for input(s): "LIPASE", "AMYLASE" in the last 168 hours. CBC: Recent Labs  Lab 06/16/22 0702 06/17/22 0107 06/18/22 0149 06/19/22 0911 06/22/22 0511  WBC 5.3 6.0 5.7 5.8 4.3  HGB 10.1* 10.1* 10.2* 9.5* 9.8*  HCT 29.8* 28.9* 30.3* 28.4* 30.0*  MCV 96.1 92.9 95.3 95.6 96.8  PLT 175 196 214 225 212   Blood Culture    Component Value Date/Time   SDES BLOOD BLOOD RIGHT WRIST 06/15/2022 1021   SPECREQUEST AEROBIC BOTTLE ONLY Blood Culture adequate volume 06/15/2022 1021   CULT  06/15/2022 1021    NO GROWTH 5 DAYS Performed at [facility] [address] [place], [place] [address]    REPTSTATUS 06/20/2022  FINAL 06/15/2022 1021    Cardiac Enzymes: No results for input(s): "CKTOTAL", "CKMB", "CKMBINDEX", "TROPONINI" in the last 168 hours. CBG: Recent Labs  Lab 06/15/22 2047  GLUCAP 150*   Iron Studies: No results for input(s): "IRON", "TIBC", "TRANSFERRIN", "FERRITIN" in the last 72 hours. Lab Results  Component Value Date   INR 1.2 06/12/2022   INR 1.2 01/17/2022   INR 1.3 (H) 01/16/2022   Studies/Results: No results found.  Medications:  vancomycin 500 mg (06/22/22 0959)    Chlorhexidine Gluconate Cloth  6 each Topical Q0600   Chlorhexidine Gluconate Cloth  6 each Topical Q0600   darbepoetin (ARANESP) injection - DIALYSIS  150 mcg Intravenous Q Tue-HD   diltiazem  240 mg Oral Daily   feeding supplement  1 Container Oral TID BM   multivitamin  1 tablet Oral QHS   pantoprazole  40 mg Oral BID   sevelamer carbonate  1,600 mg Oral TID WC    Dialysis Orders[place] [facility] 4[facility]  60.3kg  400/600  2/2 bath  LUE AVF (TDC removed here) Hep none - mircera 150 q2, last 6/24 - hectorol 4 ug iv tts  Home meds: apixaban 2.5 bid, metoprolol 25 bid, pantoprazole, sevelamer carbonate 2 ac tid  Assessment/Plan: MRSA Bacteremia - H/o MRSA bacteremia/endocarditis. IV Vancomycin per primary./ID =" end date Vanco 07/13/22" had negative TEE/ TDC removed on 7/10 by VVS. Using AVF now.  Gallbladder mass - gen surg team consulting, per notes =concern for GB adenoCa  recommending GB removal possibly OR today AMS -now resolved, probably related to infection, no acute findings on head CT.  ESRD -  HD TTS.  On schedule   Volume  - BPs good, no sig vol overload. Keep even w/ HD.  Anemia  - Hgb 9.8, aranesp 148mg due on 76/76Metabolic bone disease -phosphorus has improved.  Continue binders HTN - on diltiazem, bp's good Hx atrial flutter - getting diltiazem 30 tid here  CTobie Poet NP [facility] 06/22/2022,11:11 AM  LOS: 10 days

## 2022-06-23 DIAGNOSIS — K828 Other specified diseases of gallbladder: Secondary | ICD-10-CM | POA: Diagnosis not present

## 2022-06-23 DIAGNOSIS — R7881 Bacteremia: Secondary | ICD-10-CM | POA: Diagnosis not present

## 2022-06-23 DIAGNOSIS — B9562 Methicillin resistant Staphylococcus aureus infection as the cause of diseases classified elsewhere: Secondary | ICD-10-CM | POA: Diagnosis not present

## 2022-06-23 DIAGNOSIS — I4892 Unspecified atrial flutter: Secondary | ICD-10-CM | POA: Diagnosis not present

## 2022-06-23 LAB — CBC WITH DIFFERENTIAL/PLATELET
Abs Immature Granulocytes: 0.05 10*3/uL (ref 0.00–0.07)
Basophils Absolute: 0 10*3/uL (ref 0.0–0.1)
Basophils Relative: 0 %
Eosinophils Absolute: 0.1 10*3/uL (ref 0.0–0.5)
Eosinophils Relative: 2 %
HCT: 29.7 % — ABNORMAL LOW (ref 36.0–46.0)
Hemoglobin: 10 g/dL — ABNORMAL LOW (ref 12.0–15.0)
Immature Granulocytes: 1 %
Lymphocytes Relative: 18 %
Lymphs Abs: 1.1 10*3/uL (ref 0.7–4.0)
MCH: 32.3 pg (ref 26.0–34.0)
MCHC: 33.7 g/dL (ref 30.0–36.0)
MCV: 95.8 fL (ref 80.0–100.0)
Monocytes Absolute: 0.6 10*3/uL (ref 0.1–1.0)
Monocytes Relative: 10 %
Neutro Abs: 4.3 10*3/uL (ref 1.7–7.7)
Neutrophils Relative %: 69 %
Platelets: 213 10*3/uL (ref 150–400)
RBC: 3.1 MIL/uL — ABNORMAL LOW (ref 3.87–5.11)
RDW: 18.8 % — ABNORMAL HIGH (ref 11.5–15.5)
WBC: 6.2 10*3/uL (ref 4.0–10.5)
nRBC: 0 % (ref 0.0–0.2)

## 2022-06-23 LAB — RENAL FUNCTION PANEL
Albumin: 2.5 g/dL — ABNORMAL LOW (ref 3.5–5.0)
Anion gap: 13 (ref 5–15)
BUN: 22 mg/dL (ref 8–23)
CO2: 26 mmol/L (ref 22–32)
Calcium: 9.8 mg/dL (ref 8.9–10.3)
Chloride: 95 mmol/L — ABNORMAL LOW (ref 98–111)
Creatinine, Ser: 4.86 mg/dL — ABNORMAL HIGH (ref 0.44–1.00)
GFR, Estimated: 9 mL/min — ABNORMAL LOW (ref >60)
Glucose, Bld: 99 mg/dL (ref 70–99)
Phosphorus: 4.3 mg/dL (ref 2.5–4.6)
Potassium: 4.7 mmol/L (ref 3.5–5.1)
Sodium: 134 mmol/L — ABNORMAL LOW (ref 135–145)

## 2022-06-23 LAB — CBC
HCT: 29 % — ABNORMAL LOW (ref 36.0–46.0)
Hemoglobin: 9.8 g/dL — ABNORMAL LOW (ref 12.0–15.0)
MCH: 32.6 pg (ref 26.0–34.0)
MCHC: 33.8 g/dL (ref 30.0–36.0)
MCV: 96.3 fL (ref 80.0–100.0)
Platelets: 185 10*3/uL (ref 150–400)
RBC: 3.01 MIL/uL — ABNORMAL LOW (ref 3.87–5.11)
RDW: 19 % — ABNORMAL HIGH (ref 11.5–15.5)
WBC: 6.2 10*3/uL (ref 4.0–10.5)
nRBC: 0 % (ref 0.0–0.2)

## 2022-06-23 LAB — APTT
aPTT: 64 seconds — ABNORMAL HIGH (ref 24–36)
aPTT: 81 seconds — ABNORMAL HIGH (ref 24–36)

## 2022-06-23 LAB — HEPARIN LEVEL (UNFRACTIONATED): Heparin Unfractionated: >1.1 IU/mL — ABNORMAL HIGH (ref 0.30–0.70)

## 2022-06-23 MED ORDER — PENTAFLUOROPROP-TETRAFLUOROETH EX AERO: 1.0000 | INHALATION_SPRAY | CUTANEOUS | Status: DC | PRN

## 2022-06-23 MED ORDER — HEPARIN (PORCINE) 25000 UT/250ML-% IV SOLN
900.0000 [IU]/h | INTRAVENOUS | Status: AC
  Administered 2022-06-23: 900 [IU]/h via INTRAVENOUS
  Filled 2022-06-23: qty 250

## 2022-06-23 MED ORDER — LIDOCAINE-PRILOCAINE 2.5-2.5 % EX CREA: 1.0000 | TOPICAL_CREAM | CUTANEOUS | Status: DC | PRN

## 2022-06-23 MED ORDER — HEPARIN SODIUM (PORCINE) 1000 UNIT/ML DIALYSIS: 1000.0000 [IU] | INTRAMUSCULAR | Status: DC | PRN

## 2022-06-23 MED ORDER — ALTEPLASE 2 MG IJ SOLR
2.0000 mg | Freq: Once | INTRAMUSCULAR | Status: DC | PRN
Start: 2022-06-23 — End: 2022-06-24

## 2022-06-23 NOTE — TOC Progression Note (Signed)
Transition of Care [facility]) - Progression Note    Patient Details  Name: Diamond [name] MRN: [id number] Date of Birth: 1951-03-19  Transition of Care [facility]) CM/SW [place], LCSW Phone Number: 06/23/2022, 4:01 PM  Clinical Narrative:    CSW spoke with patient and daughter, [name], at bedside to discuss discharge plan. Patient reported not being sure if she wants to go to SNF. Daughter will speak with her family and they will make a decision hopefully by tomorrow. CSW provided SNF bed offers just in case.    Expected Discharge Plan: [place] Barriers to Discharge: Continued Medical Work up, Ship broker, SNF Pending bed offer  Expected Discharge Plan and Services Expected Discharge Plan: [place] In-house Referral: Clinical Social Work   Post Acute Care Choice: [place] Living arrangements for the past 2 months: [place] Determinants of Health (SDOH) Interventions    Readmission Risk Interventions    06/17/2022    1:02 PM 01/22/2022   12:19 PM  Readmission Risk Prevention Plan  Transportation Screening Complete Complete  Medication Review (RN Care Manager) Complete Complete  PCP or Specialist appointment within 3-5 days of discharge Complete Complete  HRI or Home Care Consult Complete Complete  SW Recovery Care/Counseling Consult Complete Complete  Palliative Care Screening Not Applicable Not [place] Complete Not Applicable

## 2022-06-23 NOTE — Progress Notes (Signed)
ANTICOAGULATION CONSULT NOTE - Follow Up Consult  Pharmacy Consult for Heparin Indication: atrial fibrillation  Allergies  Allergen Reactions   Shellfish Allergy     Unknown reaction     Patient Measurements: Height: '5\' 7"'$  (170.2 cm) Weight: 59.6 kg (131 lb 6.3 oz) IBW/kg (Calculated) : 61.6 Heparin Dosing Weight: 59 kg  Vital Signs: Temp: 99.5 F (37.5 C) (07/19 1245) Temp Source: Oral (07/19 1245) BP: 121/82 (07/19 1245) Pulse Rate: 90 (07/19 1245)  Labs: Recent Labs     0000 06/21/22 0643 06/22/22 0511 06/22/22 0800 06/22/22 1020 06/22/22 1020 06/22/22 2123 06/23/22 0445 06/23/22 1357 06/23/22 1358  HGB   < >  --  9.8*  --   --   --   --  10.0*  --  9.8*  HCT  --   --  30.0*  --   --   --   --  29.7*  --  29.0*  PLT  --   --  212  --   --   --   --  213  --  185  APTT  --   --   --   --  44*   < > 71* 64* 81*  --   HEPARINUNFRC  --   --   --   --  >1.10*  --  >1.10* >1.10*  --   --   CREATININE  --  5.40*  --  7.70*  --   --   --   --   --  4.86*   < > = values in this interval not displayed.    Estimated Creatinine Clearance: 9.8 mL/min (A) (by C-G formula based on SCr of 4.86 mg/dL (H)).  Assessment: 71 y.o. female with history of atrial fibrillation on Eliquis prior to admission. Eliquis was initially held 7/11 for procedure and patient was transitioned to IV heparin 7/11-7/12. On 7/12 patient was transitioned back to Eliquis. Pharmacy consulted on 7/18 to transition patient from Eliquis to IV heparin for upcoming procedure. Last dose of Eliquis was 7/17 2200. Will not load heparin due to recent Eliquis use. Will monitor aPTTs due to apixaban's effect on elevated heparin levels, until heparin level and aPTT correlate.    aPTT is therapeutic (81 seconds) on heparin at 900 units/hr. Back into target range after rate increase from 850 units/hr this morning, when aPTT was 64 seconds, just below goal. CBC stable.   Goal of Therapy:  Heparin level 0.3-0.7  units/ml aPTT 66-102 seconds Monitor platelets by anticoagulation protocol: Yes   Plan:  Continue heparin drip at 900 units/hr Next aPTT, heparin level and CBC in am. Eliquis on hold for surgery, scheduled for 0815 on 7/21 Will plan to hold heparin drip at 2am on 7/21, unless surgery specifies otherwise.  Arty Baumgartner, RPh 06/23/2022,3:50 PM

## 2022-06-23 NOTE — Progress Notes (Signed)
  Progress Note   Patient: Diamond [name] MPN:[id number] DOB: 10/11/1951 DOA: 06/12/2022     11 DOS: the patient was seen and examined on 06/23/2022   Brief hospital course: 71 year old woman PMH including ESRD presented with nausea, vomiting, diarrhea, admitted for generalized abdominal pain, gallbladder mass, hyperkalemia and acute metabolic encephalopathy.*  Assessment and Plan: * MRSA bacteremia -- Thought secondary to HD catheter, also has PMH MRSA bacteremia, L5-S1 discitis and endocarditis 6 months prior. --Dialysis catheter removed 7/10 and culture tip positive for MRSA. TEE negative for vegetation but but similar very small mobile echodensity seen in RVOT which was better visualized on prior TTE on 03/03/2022.  Repeat blood culture on 7/10 and 7/11 NGTD. ---ID recommended vancomycin with HD and total 07/13/2022.  Gallbladder mass --Surgery plans to take the patient to the OR in 48 hours.  ESRD on dialysis [facility]) with hyperkalemia on admission. Anemia of ESRD --AV fistula being used for HD. --Management per nephrology.  Paroxysmal atrial flutter (HCC) -- Continue Cardizem.  Apixaban on hold in anticipation of surgery.  Heparin infusion in the interim.  Acute metabolic encephalopathy -- Resolved.  Etiology unclear.  Physical deconditioning --PT, OT recommended SNF but patient hopeful to return home.     Subjective:  Feels fine, no complaints  Physical Exam: Vitals:   06/23/22 0400 06/23/22 0819 06/23/22 1245 06/23/22 1715  BP:  (!) 146/86 121/82 (!) 156/87  Pulse: (!) 102 92 90 89  Resp: '16 20 20 16  '$ Temp: 98.2 F (36.8 C) 99.5 F (37.5 C) 99.5 F (37.5 C) 99.2 F (37.3 C)  TempSrc: Oral Oral Oral Oral  SpO2: 99% 100% 97% 95%  Weight:      Height:       Physical Exam Vitals reviewed.  Constitutional:      General: She is not in acute distress.    Appearance: She is not ill-appearing or toxic-appearing.  Cardiovascular:     Rate and Rhythm: Normal rate and  regular rhythm.     Heart sounds: No murmur heard. Pulmonary:     Effort: Pulmonary effort is normal. No respiratory distress.     Breath sounds: No wheezing, rhonchi or rales.  Musculoskeletal:     Right lower leg: No edema.     Left lower leg: No edema.  Neurological:     Mental Status: She is alert.  Psychiatric:        Mood and Affect: Mood normal.        Behavior: Behavior normal.     Data Reviewed:  BMP noted Hgb stable 9.8  Family Communication: daughter at bedside  Disposition: Status is: Inpatient Remains inpatient appropriate because: surgery Friday  Planned Discharge Destination: Home    Time spent: 25 minutes  Author: Murray Hodgkins, MD 06/23/2022 7:52 PM  For on call review [email or web address].

## 2022-06-23 NOTE — Progress Notes (Signed)
[facility] Progress Note   Subjective:    Seen and examined at bedside. No acute issues. Tolerated yesterday's HD with net UF 1L. Noted General Surgery plan to take patient to OR for gallbladder mass later this week. Plan for HD 7/20.  Objective Vitals:   06/22/22 1652 06/23/22 0000 06/23/22 0400 06/23/22 0819  BP: (!) 148/95 (!) 136/98  (!) 146/86  Pulse: 95 97 (!) 102 92  Resp: '16 16 16 20  '$ Temp: 98.6 F (37 C) 97.9 F (36.6 C) 98.2 F (36.8 C) 99.5 F (37.5 C)  TempSrc: Oral Oral Oral Oral  SpO2: 100% 95% 99% 100%  Weight:      Height:       Physical Exam General: Chronically ill-appearing; on RA; NAD Heart: S1 and S2; No murmurs, gallops, or rubs Lungs: Clear anteriorly and laterally Abdomen: Soft and non-tender Extremities: No edema BLLE Dialysis Access: L AVF (+) B/T    Filed Weights   06/21/22 0500 06/22/22 0800 06/22/22 1154  Weight: 59.2 kg 59.6 kg 59.6 kg    Intake/Output Summary (Last 24 hours) at 06/23/2022 0917 Last data filed at 06/22/2022 1243 Gross per 24 hour  Intake --  Output 2000 ml  Net -2000 ml    Additional Objective Labs: Basic Metabolic Panel: Recent Labs  Lab 06/19/22 1130 06/21/22 0643 06/22/22 0800  NA 126* 133* 135  K 3.9 4.7 5.0  CL 85* 97* 97*  CO2 '27 25 27  '$ GLUCOSE 83 89 104*  BUN 28* 28* 39*  CREATININE 5.61* 5.40* 7.70*  CALCIUM 9.1 9.4 9.3  PHOS 4.1 5.4* 6.5*   Liver Function Tests: Recent Labs  Lab 06/19/22 1130 06/21/22 0643 06/22/22 0800  ALBUMIN 2.1* 2.4* 2.5*   No results for input(s): "LIPASE", "AMYLASE" in the last 168 hours. CBC: Recent Labs  Lab 06/17/22 0107 06/18/22 0149 06/19/22 0911 06/22/22 0511 06/23/22 0445  WBC 6.0 5.7 5.8 4.3 6.2  NEUTROABS  --   --   --   --  4.3  HGB 10.1* 10.2* 9.5* 9.8* 10.0*  HCT 28.9* 30.3* 28.4* 30.0* 29.7*  MCV 92.9 95.3 95.6 96.8 95.8  PLT 196 214 225 212 213   Blood Culture    Component Value Date/Time   SDES BLOOD BLOOD RIGHT WRIST  06/15/2022 1021   SPECREQUEST AEROBIC BOTTLE ONLY Blood Culture adequate volume 06/15/2022 1021   CULT  06/15/2022 1021    NO GROWTH 5 DAYS Performed at [facility] [address], [place], [place] [address]    REPTSTATUS 06/20/2022 FINAL 06/15/2022 1021    Cardiac Enzymes: No results for input(s): "CKTOTAL", "CKMB", "CKMBINDEX", "TROPONINI" in the last 168 hours. CBG: No results for input(s): "GLUCAP" in the last 168 hours. Iron Studies: No results for input(s): "IRON", "TIBC", "TRANSFERRIN", "FERRITIN" in the last 72 hours. Lab Results  Component Value Date   INR 1.2 06/12/2022   INR 1.2 01/17/2022   INR 1.3 (H) 01/16/2022   Studies/Results: No results found.  Medications:  heparin 900 Units/hr (06/23/22 4097)   vancomycin Stopped (06/22/22 1100)    Chlorhexidine Gluconate Cloth  6 each Topical Q0600   Chlorhexidine Gluconate Cloth  6 each Topical Q0600   darbepoetin (ARANESP) injection - DIALYSIS  150 mcg Intravenous Q Tue-HD   diltiazem  240 mg Oral Daily   feeding supplement  1 Container Oral TID BM   multivitamin  1 tablet Oral QHS   pantoprazole  40 mg Oral BID   sevelamer carbonate  1,600 mg Oral TID WC    Dialysis Orders: [place] TTS 4h  60.3kg  400/600  2/2 bath  LUE AVF (TDC removed here) Hep none - mircera 150 q2, last 6/24 - hectorol 4 ug iv tts   Home meds: apixaban 2.5 bid, metoprolol 25 bid, pantoprazole, sevelamer carbonate 2 ac tid  Assessment/Plan: MRSA Bacteremia - H/o MRSA bacteremia/endocarditis. IV Vancomycin per primary. ID recommends IV vancomycin with HD for 4 weeks with end date 07/13/22. Had negative TEE/ TDC removed on 7/10 by VVS. Using AVF now.  Gallbladder mass - gen surg team consulting, per notes, concern for GB adenoCa  and recommending GB removal-plan to take patient to the OR later this week AMS -now resolved, probably related to infection, no acute findings on head CT.  ESRD -  HD TTS. Next HD 06/24/22.  Volume  - BPs good,  no sig vol overload. Keep even w/ HD.  Anemia  - Hgb 9.8, aranesp 158mg given on 72/40Metabolic bone disease -phosphorus has improved.  Continue binders HTN - on diltiazem, bp's good Hx atrial flutter - getting diltiazem 30 tid here  CTobie Poet NP [facility] 06/23/2022,9:17 AM  LOS: 11 days

## 2022-06-23 NOTE — Progress Notes (Signed)
ANTICOAGULATION CONSULT NOTE  Pharmacy Consult for heparin Indication: atrial fibrillation  Allergies  Allergen Reactions   Shellfish Allergy     Unknown reaction     Patient Measurements: Height: '5\' 7"'$  (170.2 cm) Weight: 59.6 kg (131 lb 6.3 oz) IBW/kg (Calculated) : 61.6 kg Heparin Dosing Weight: 59 kg  Vital Signs: Temp: 98.2 F (36.8 C) (07/19 0400) Temp Source: Oral (07/19 0400) BP: 136/98 (07/19 0000) Pulse Rate: 102 (07/19 0400)  Labs: Recent Labs    06/21/22 0643 06/22/22 0511 06/22/22 0800 06/22/22 1020 06/22/22 2123 06/23/22 0445  HGB  --  9.8*  --   --   --  10.0*  HCT  --  30.0*  --   --   --  29.7*  PLT  --  212  --   --   --  213  APTT  --   --   --  44* 71* 64*  HEPARINUNFRC  --   --   --  >1.10* >1.10* >1.10*  CREATININE 5.40*  --  7.70*  --   --   --      Estimated Creatinine Clearance: 6.2 mL/min (A) (by C-G formula based on SCr of 7.7 mg/dL (H)).   Assessment: 71 y.o. female with history of atrial fibrillation on Eliquis prior to admission. Eliquis was initially held 7/11 for procedure and patient was transitioned to IV heparin 7/11-7/12. On 7/12 patient was transitioned back to Eliquis. Pharmacy consulted on 7/18 to transition patient from Eliquis to IV heparin for upcoming procedure. Last dose of Eliquis was 7/17 2200. Will not load heparin due to recent Eliquis use. Will monitor aPTTs due to apixaban's effect on elevated heparin levels, until heparin level and aPTT correlate. Patient's Hgb stable and platelets 212.  aPTT is therapeutic at 71 sec (~6 hr level). No bleeding noted.   7/19 AM update:  aPTT just below goal  Goal of Therapy:  Heparin level 0.3-0.7 units/ml aPTT 66-102 seconds Monitor platelets by anticoagulation protocol: Yes   Plan:  Inc heparin to 900 units/hr 1400 aPTT  Narda Bonds, PharmD, BCPS Clinical Pharmacist Phone: [phone]

## 2022-06-24 DIAGNOSIS — R7881 Bacteremia: Secondary | ICD-10-CM | POA: Diagnosis not present

## 2022-06-24 DIAGNOSIS — I4892 Unspecified atrial flutter: Secondary | ICD-10-CM | POA: Diagnosis not present

## 2022-06-24 DIAGNOSIS — B9562 Methicillin resistant Staphylococcus aureus infection as the cause of diseases classified elsewhere: Secondary | ICD-10-CM | POA: Diagnosis not present

## 2022-06-24 DIAGNOSIS — K828 Other specified diseases of gallbladder: Secondary | ICD-10-CM | POA: Diagnosis not present

## 2022-06-24 LAB — APTT: aPTT: 72 seconds — ABNORMAL HIGH (ref 24–36)

## 2022-06-24 LAB — CBC WITH DIFFERENTIAL/PLATELET
Abs Immature Granulocytes: 0.02 10*3/uL (ref 0.00–0.07)
Basophils Absolute: 0 10*3/uL (ref 0.0–0.1)
Basophils Relative: 1 %
Eosinophils Absolute: 0.2 10*3/uL (ref 0.0–0.5)
Eosinophils Relative: 4 %
HCT: 31.2 % — ABNORMAL LOW (ref 36.0–46.0)
Hemoglobin: 10.1 g/dL — ABNORMAL LOW (ref 12.0–15.0)
Immature Granulocytes: 0 %
Lymphocytes Relative: 16 %
Lymphs Abs: 0.9 10*3/uL (ref 0.7–4.0)
MCH: 32.1 pg (ref 26.0–34.0)
MCHC: 32.4 g/dL (ref 30.0–36.0)
MCV: 99 fL (ref 80.0–100.0)
Monocytes Absolute: 0.5 10*3/uL (ref 0.1–1.0)
Monocytes Relative: 9 %
Neutro Abs: 3.6 10*3/uL (ref 1.7–7.7)
Neutrophils Relative %: 70 %
Platelets: 196 10*3/uL (ref 150–400)
RBC: 3.15 MIL/uL — ABNORMAL LOW (ref 3.87–5.11)
RDW: 18.9 % — ABNORMAL HIGH (ref 11.5–15.5)
WBC: 5.2 10*3/uL (ref 4.0–10.5)
nRBC: 0 % (ref 0.0–0.2)

## 2022-06-24 LAB — HEPARIN LEVEL (UNFRACTIONATED): Heparin Unfractionated: >1.1 IU/mL — ABNORMAL HIGH (ref 0.30–0.70)

## 2022-06-24 LAB — PREPARE RBC (CROSSMATCH)

## 2022-06-24 MED ORDER — CHLORHEXIDINE GLUCONATE CLOTH 2 % EX PADS
6.0000 | MEDICATED_PAD | Freq: Once | CUTANEOUS | Status: AC
Start: 2022-06-24 — End: 2022-06-24
  Administered 2022-06-24: 6 via TOPICAL

## 2022-06-24 MED ORDER — ENSURE PRE-SURGERY PO LIQD
296.0000 mL | Freq: Once | ORAL | Status: AC
  Administered 2022-06-25: 296 mL via ORAL
  Filled 2022-06-24: qty 296

## 2022-06-24 MED ORDER — ENSURE PRE-SURGERY PO LIQD
592.0000 mL | Freq: Once | ORAL | Status: AC
  Administered 2022-06-24: 592 mL via ORAL
  Filled 2022-06-24: qty 592

## 2022-06-24 MED ORDER — METOPROLOL TARTRATE 50 MG PO TABS
50.0000 mg | ORAL_TABLET | Freq: Two times a day (BID) | ORAL | Status: DC
  Administered 2022-06-24 – 2022-06-30 (×10): 50 mg via ORAL
  Filled 2022-06-24 (×11): qty 1

## 2022-06-24 MED ORDER — CHLORHEXIDINE GLUCONATE CLOTH 2 % EX PADS
6.0000 | MEDICATED_PAD | Freq: Once | CUTANEOUS | Status: AC
  Administered 2022-06-24: 6 via TOPICAL

## 2022-06-24 NOTE — Progress Notes (Signed)
  Progress Note   Patient: Diamond [name] YQM:[id number] DOB: 09/13/1951 DOA: 06/12/2022     12 DOS: the patient was seen and examined on 06/24/2022   Brief hospital course: 71 year old woman PMH including ESRD presented with nausea, vomiting, diarrhea, admitted for generalized abdominal pain, gallbladder mass, hyperkalemia and acute metabolic encephalopathy.*  Assessment and Plan: * MRSA bacteremia -- Thought secondary to HD catheter, also has PMH MRSA bacteremia, L5-S1 discitis and endocarditis 6 months prior. --Dialysis catheter removed 7/10 and culture tip positive for MRSA. TEE negative for vegetation but but similar very small mobile echodensity seen in RVOT which was better visualized on prior TTE on 03/03/2022.  Repeat blood culture 7/11 NG/F ---ID recommended vancomycin with HD through 07/13/2022. Follow-up with ID as an outpatient.   Gallbladder mass --Surgery planned 7/21   ESRD on dialysis [facility]) with hyperkalemia on admission. Anemia of ESRD --AV fistula being used for HD. --Management per nephrology.   Paroxysmal atrial flutter (HCC) -- was on metoprolol prior to admit; had afib rvr and started on dilt gtt, then to PO Cardizem.  Apixaban on hold in anticipation of surgery.  Heparin infusion in the interim. --back in afib today with tachycardia, diltiazem was held this AM, expect admin to control rate (given). Will transition back to home metoprolol   Acute metabolic encephalopathy -- Resolved.  Etiology unclear.   Physical deconditioning --PT, OT recommended SNF but patient hopeful to return home.  Non-severe (moderate) malnutrition in context of chronic illness --per dietician  Subjective:  Feels better No pain Went into afib during HD  Physical Exam: Vitals:   06/24/22 1130 06/24/22 1200 06/24/22 1230 06/24/22 1300  BP: (!) 155/55 (!) 148/86 (!) 150/65 (!) 135/110  Pulse: 91 94 87 78  Resp: '15 16 14 20  '$ Temp:    97.7 F (36.5 C)  TempSrc:      SpO2: 97% 100%  98% 100%  Weight:      Height:       Physical Exam Vitals reviewed.  Constitutional:      General: She is not in acute distress.    Appearance: She is not ill-appearing or toxic-appearing.  Cardiovascular:     Rate and Rhythm: Tachycardia present. Rhythm irregular.     Heart sounds: No murmur heard. Pulmonary:     Effort: Pulmonary effort is normal. No respiratory distress.     Breath sounds: No wheezing, rhonchi or rales.  Neurological:     Mental Status: She is alert.  Psychiatric:        Mood and Affect: Mood normal.        Behavior: Behavior normal.     Data Reviewed:  There are no new results to review at this time.  Family Communication: none  Disposition: Status is: Inpatient Remains inpatient appropriate because: plan for surgery 7/21  Planned Discharge Destination: Home    Time spent: 20 minutes  Author: Murray Hodgkins, MD 06/24/2022 7:00 PM  For on call review [email or web address].

## 2022-06-24 NOTE — Progress Notes (Signed)
[facility] Progress Note   Subjective:    Seen and examined patient on HD. Tolerating UF 1L. No issues/complaints. Scheduled for surgery tomorrow for gallbladder mass.  Objective Vitals:   06/24/22 0930 06/24/22 1000 06/24/22 1030 06/24/22 1100  BP: (!) 148/80 (!) 156/92 (!) 168/64 (!) 164/55  Pulse: 90 88 86 86  Resp: '17 16 18 18  '$ Temp:      TempSrc:      SpO2: (!) 89% 97% 96% 96%  Weight:      Height:       Physical Exam General: Chronically ill-appearing; on RA; NAD Heart: S1 and S2; No murmurs, gallops, or rubs Lungs: Clear anteriorly and laterally Abdomen: Soft and non-tender Extremities: No edema BLLE Dialysis Access: L AVF (+) B/T   Filed Weights   06/21/22 0500 06/22/22 0800 06/22/22 1154  Weight: 59.2 kg 59.6 kg 59.6 kg    Intake/Output Summary (Last 24 hours) at 06/24/2022 1127 Last data filed at 06/24/2022 0600 Gross per 24 hour  Intake 85.08 ml  Output 400 ml  Net -314.92 ml    Additional Objective Labs: Basic Metabolic Panel: Recent Labs  Lab 06/21/22 0643 06/22/22 0800 06/23/22 1358  NA 133* 135 134*  K 4.7 5.0 4.7  CL 97* 97* 95*  CO2 '25 27 26  '$ GLUCOSE 89 104* 99  BUN 28* 39* 22  CREATININE 5.40* 7.70* 4.86*  CALCIUM 9.4 9.3 9.8  PHOS 5.4* 6.5* 4.3   Liver Function Tests: Recent Labs  Lab 06/21/22 0643 06/22/22 0800 06/23/22 1358  ALBUMIN 2.4* 2.5* 2.5*   No results for input(s): "LIPASE", "AMYLASE" in the last 168 hours. CBC: Recent Labs  Lab 06/19/22 0911 06/22/22 0511 06/23/22 0445 06/23/22 1358 06/24/22 0435  WBC 5.8 4.3 6.2 6.2 5.2  NEUTROABS  --   --  4.3  --  3.6  HGB 9.5* 9.8* 10.0* 9.8* 10.1*  HCT 28.4* 30.0* 29.7* 29.0* 31.2*  MCV 95.6 96.8 95.8 96.3 99.0  PLT 225 212 213 185 196   Blood Culture    Component Value Date/Time   SDES BLOOD BLOOD RIGHT WRIST 06/15/2022 1021   SPECREQUEST AEROBIC BOTTLE ONLY Blood Culture adequate volume 06/15/2022 1021   CULT  06/15/2022 1021    NO GROWTH 5  DAYS Performed at [facility] [address] [place], [place] [address]    REPTSTATUS 06/20/2022 FINAL 06/15/2022 1021    Cardiac Enzymes: No results for input(s): "CKTOTAL", "CKMB", "CKMBINDEX", "TROPONINI" in the last 168 hours. CBG: No results for input(s): "GLUCAP" in the last 168 hours. Iron Studies: No results for input(s): "IRON", "TIBC", "TRANSFERRIN", "FERRITIN" in the last 72 hours. Lab Results  Component Value Date   INR 1.2 06/12/2022   INR 1.2 01/17/2022   INR 1.3 (H) 01/16/2022   Studies/Results: No results found.  Medications:  heparin 900 Units/hr (06/24/22 0525)   vancomycin Stopped (06/22/22 1100)    Chlorhexidine Gluconate Cloth  6 each Topical Q0600   Chlorhexidine Gluconate Cloth  6 each Topical Once   And   Chlorhexidine Gluconate Cloth  6 each Topical Once   darbepoetin (ARANESP) injection - DIALYSIS  150 mcg Intravenous Q Tue-HD   diltiazem  240 mg Oral Daily   feeding supplement  1 Container Oral TID BM   [START ON 06/25/2022] feeding supplement  296 mL Oral Once   feeding supplement  592 mL Oral Once   multivitamin  1 tablet Oral QHS   pantoprazole  40 mg Oral BID  sevelamer carbonate  1,600 mg Oral TID WC    Dialysis Orders: [facility] 4h  60.3kg  400/600  2/2 bath  LUE AVF (TDC removed here) Hep none - mircera 150 q2, last 6/24 - hectorol 4 ug iv tts   Home meds: apixaban 2.5 bid, metoprolol 25 bid, pantoprazole, sevelamer carbonate 2 ac tid  Assessment/Plan: MRSA Bacteremia - H/o MRSA bacteremia/endocarditis. IV Vancomycin per primary. ID recommends IV vancomycin with HD for 4 weeks with end date 07/13/22. Had negative TEE/ TDC removed on 7/10 by [facility]. Using AVF now.  Gallbladder mass - gen surg team consulting, per notes, concern for GB adenoCa  and recommending GB removal-plan to take patient to the OR tomorrow AMS -now resolved, probably related to infection, no acute findings on head CT.  ESRD -  HD TTS. On HD.  Volume  - BPs  good, no sig vol overload. Continue minimal UF. Run even if needed. Anemia  - Hgb 9.8, aranesp 141mg given on 77/61Metabolic bone disease -phosphorus has improved.  Continue binders HTN - on diltiazem, bp's good Hx atrial flutter - getting diltiazem 30 tid here  CTobie Poet NP [facility] 06/24/2022,11:27 AM  LOS: 12 days

## 2022-06-24 NOTE — Progress Notes (Signed)
Received patient in bed, alert and oriented. Informed consent signed and in chart.  Time tx completed:1300  HD treatment completed. Patient tolerated well. Fistula without signs and symptoms of complications. Patient transported back to the room, alert and orient and in no acute distress. Report given to bedside RN.  Total UF removed:1000  Medication given:Vanc will be given iV  Post HD VS:135/110,78,20,100%, 97.7  Post HD weight: 58kg

## 2022-06-24 NOTE — Progress Notes (Signed)
Pharmacy Antibiotic Follow Up Note  Diamond [name] is a 71 y.o. female admitted on 06/12/2022 with MRSA bacteremia.  Pharmacy has been consulted for vancomycin dosing. Patient got loading dose of vancomycin on 7/9. Patient is s/p TDC removal on 7/10. Patient is on HD schedule every Tuesday, Thursday, Saturday.   Pre-HD vancomycin random within goal range on 7/15. Patient remains afebrile and WBC are WNL.  Plan: Continue Vancomycin 500 mg qHD-TTS through 07/13/2022 Monitor clinical progress, length of therapy, cultures  Assess pre-HD vanc levels per protocol  Check pre-HD level next week    Height: '5\' 7"'$  (170.2 cm) Weight: 59.6 kg (131 lb 6.3 oz) IBW/kg (Calculated) : 61.6  Temp (24hrs), Avg:98.7 F (37.1 C), Min:97.8 F (36.6 C), Max:99.5 F (37.5 C)  Recent Labs  Lab 06/18/22 0149 06/19/22 0116 06/19/22 0911 06/19/22 1130 06/21/22 0643 06/22/22 0511 06/22/22 0800 06/23/22 0445 06/23/22 1358 06/24/22 0435  WBC 5.7  --  5.8  --   --  4.3  --  6.2 6.2 5.2  CREATININE 3.93*  --   --  5.61* 5.40*  --  7.70*  --  4.86*  --   VANCORANDOM  --  15  --   --   --   --   --   --   --   --      Estimated Creatinine Clearance: 9.8 mL/min (A) (by C-G formula based on SCr of 4.86 mg/dL (H)).    Allergies  Allergen Reactions   Shellfish Allergy     Unknown reaction     Antimicrobials this admission: Ceftriaxone 7/8 x1 Vancomycin 7/8 >> (8/08)  Microbiology results: 7/8 Bcx 2/2: MRSA 7/10 Bcx: ng x 5 days 7/10 Cath tip: 20K col/ml MRSA 7/11 Bcx: ng x 5 days  7/11 MRSA PCR: positive  Vicenta Dunning, PharmD  PGY1 Pharmacy Resident

## 2022-06-24 NOTE — Progress Notes (Signed)
Spoke to pt's dtrs re SNF choice and they are still reviewing options. SNF auth request submitted to [employer] (ref # [id number]) but may need to submitted again if pt not medically ready over weekend. Plan for surgery tomorrow. SW will assist as indicated.   Wandra Feinstein, MSW, LCSW [phone] (coverage)

## 2022-06-24 NOTE — Progress Notes (Signed)
PT Cancellation Note  Patient Details Name: Diamond [name] MRN: [id number] DOB: 19-Dec-1950   Cancelled Treatment:    Reason Eval/Treat Not Completed: Patient at procedure or test/unavailable.  Pt has been in HD and PT has made mult checks back on her.  Retry as time and pt allow.   Ramond Dial 06/24/2022, 2:42 PM  Mee Hives, PT PhD Acute Rehab Dept. Number: [place] [facility] [place]

## 2022-06-24 NOTE — Progress Notes (Signed)
7 Days Post-Op  Subjective: No acute issues. Planning for HD today. On heparin gtt.   Objective: Vital signs in last 24 hours: Temp:  [97.8 F (36.6 C)-99.5 F (37.5 C)] 98.1 F (36.7 C) (07/20 0400) Pulse Rate:  [85-93] 85 (07/20 0400) Resp:  [16-20] 17 (07/20 0400) BP: (121-156)/(82-105) 149/94 (07/20 0400) SpO2:  [95 %-100 %] 98 % (07/20 0400) Last BM Date : 06/23/22  Intake/Output from previous day: 07/19 0701 - 07/20 0700 In: 85.1 [I.V.:85.1] Out: 400 [Urine:400] Intake/Output this shift: No intake/output data recorded.  PE: General: resting comfortably, NAD Neuro: alert and oriented, no focal deficits Resp: normal work of breathing on room air Abdomen: soft, nondistended, nontender to palpation. Extremities: warm and well-perfused   Lab Results:  Recent Labs    06/23/22 1358 06/24/22 0435  WBC 6.2 5.2  HGB 9.8* 10.1*  HCT 29.0* 31.2*  PLT 185 196   BMET Recent Labs    06/22/22 0800 06/23/22 1358  NA 135 134*  K 5.0 4.7  CL 97* 95*  CO2 27 26  GLUCOSE 104* 99  BUN 39* 22  CREATININE 7.70* 4.86*  CALCIUM 9.3 9.8   PT/INR No results for input(s): "LABPROT", "INR" in the last 72 hours. CMP     Component Value Date/Time   NA 134 (L) 06/23/2022 1358   K 4.7 06/23/2022 1358   CL 95 (L) 06/23/2022 1358   CO2 26 06/23/2022 1358   GLUCOSE 99 06/23/2022 1358   BUN 22 06/23/2022 1358   CREATININE 4.86 (H) 06/23/2022 1358   CREATININE 5.76 (H) 12/31/2020 1456   CALCIUM 9.8 06/23/2022 1358   PROT 7.0 06/12/2022 1545   ALBUMIN 2.5 (L) 06/23/2022 1358   AST 16 06/12/2022 1545   ALT 10 06/12/2022 1545   ALKPHOS 56 06/12/2022 1545   BILITOT 1.1 06/12/2022 1545   GFRNONAA 9 (L) 06/23/2022 1358   GFRAA 14 (L) 11/12/2019 0344   Lipase     Component Value Date/Time   LIPASE 52 (H) 06/12/2022 1545       Studies/Results: No results found.  Anti-infectives: Anti-infectives (From admission, onward)    Start     Dose/Rate Route Frequency  Ordered Stop   06/15/22 1200  vancomycin (VANCOREADY) IVPB 500 mg/100 mL        500 mg 100 mL/hr over 60 Minutes Intravenous Every T-Th-Sa (Hemodialysis) 06/15/22 1028 07/13/22 2359   06/14/22 1200  vancomycin (VANCOREADY) IVPB 500 mg/100 mL        500 mg 100 mL/hr over 60 Minutes Intravenous Every M-W-F (Hemodialysis) 06/14/22 0814 06/14/22 1714   06/13/22 1130  cefTRIAXone (ROCEPHIN) 2 g in sodium chloride 0.9 % 100 mL IVPB  Status:  Discontinued        2 g 200 mL/hr over 30 Minutes Intravenous Every 24 hours 06/13/22 1118 06/13/22 1249   06/13/22 0800  vancomycin (VANCOREADY) IVPB 1250 mg/250 mL        1,250 mg 166.7 mL/hr over 90 Minutes Intravenous  Once 06/13/22 0757 06/13/22 1223   06/13/22 0757  vancomycin variable dose per unstable renal function (pharmacist dosing)  Status:  Discontinued         Does not apply See admin instructions 06/13/22 0757 06/15/22 1029        Assessment/Plan  71 yo female with gallbladder mass, highly suspicious for adenocarcinoma.  - Patient is scheduled for surgery tomorrow (staging laparoscopy and radical cholecystectomy). I reviewed this again with her today and she agrees to proceed. -  Preop ERAS protocol ordered. Clear liquids at midnight, NPO at 4:30am. - Heparin gtt is to be held at midnight  - Type and cross ordered, 2u PRBCs to be placed on hold   LOS: 12 days    Michaelle Birks, MD [facility], Hepatobiliary and Pancreatic Surgery 06/24/22 8:12 AM

## 2022-06-24 NOTE — Progress Notes (Signed)
ANTICOAGULATION CONSULT NOTE - Follow Up Consult  Pharmacy Consult for Heparin Indication: atrial fibrillation  Allergies  Allergen Reactions   Shellfish Allergy     Unknown reaction     Patient Measurements: Height: '5\' 7"'$  (170.2 cm) Weight: 59.6 kg (131 lb 6.3 oz) IBW/kg (Calculated) : 61.6 Heparin Dosing Weight: 59 kg  Vital Signs: Temp: 98.1 F (36.7 C) (07/20 0400) Temp Source: Oral (07/20 0400) BP: 148/80 (07/20 0930) Pulse Rate: 90 (07/20 0930)  Labs: Recent Labs    06/22/22 0800 06/22/22 1020 06/22/22 2123 06/23/22 0445 06/23/22 1357 06/23/22 1358 06/24/22 0435  HGB  --   --   --  10.0*  --  9.8* 10.1*  HCT  --   --   --  29.7*  --  29.0* 31.2*  PLT  --   --   --  213  --  185 196  APTT  --    < > 71* 64* 81*  --  72*  HEPARINUNFRC  --    < > >1.10* >1.10*  --   --  >1.10*  CREATININE 7.70*  --   --   --   --  4.86*  --    < > = values in this interval not displayed.     Estimated Creatinine Clearance: 9.8 mL/min (A) (by C-G formula based on SCr of 4.86 mg/dL (H)).  Assessment: 71 y.o. female with history of atrial fibrillation on Eliquis prior to admission. Eliquis was initially held 7/11 for procedure and patient was transitioned to IV heparin 7/11-7/12. On 7/12 patient was transitioned back to Eliquis. Pharmacy consulted on 7/18 to transition patient from Eliquis to IV heparin for upcoming procedure. Last dose of Eliquis was 7/17 2200. Will not load heparin due to recent Eliquis use. Will monitor aPTTs due to apixaban's effect on elevated heparin levels, until heparin level and aPTT correlate.   aPTT is therapeutic (72 seconds) on heparin at 900 units/hr. Patient is to have surgery on 7/21, heparin infusion will stop at midnight. Hemoglobin and hematocrit stable, no bleeding concerns noted.   Goal of Therapy:  Heparin level 0.3-0.7 units/ml aPTT 66-102 seconds Monitor platelets by anticoagulation protocol: Yes   Plan:  Continue heparin drip at 900  units/hr Eliquis on hold for surgery, scheduled for 0815 on 7/21 Will plan to hold heparin drip at midnight on 7/21 Will follow up post-op to resume anticoagulation plan   Vicenta Dunning, PharmD  PGY1 Pharmacy Resident

## 2022-06-25 ENCOUNTER — Inpatient Hospital Stay (HOSPITAL_COMMUNITY): Payer: Medicare HMO | Admitting: Anesthesiology

## 2022-06-25 ENCOUNTER — Encounter (HOSPITAL_COMMUNITY): Payer: Self-pay | Admitting: Internal Medicine

## 2022-06-25 ENCOUNTER — Other Ambulatory Visit: Payer: Self-pay

## 2022-06-25 ENCOUNTER — Encounter (HOSPITAL_COMMUNITY)
Admission: EM | Disposition: A | Discharge status: Skilled Nursing Facility | Payer: Self-pay | Source: Home / Self Care | Attending: Family Medicine

## 2022-06-25 DIAGNOSIS — Z992 Dependence on renal dialysis: Secondary | ICD-10-CM | POA: Diagnosis not present

## 2022-06-25 DIAGNOSIS — R7881 Bacteremia: Secondary | ICD-10-CM | POA: Diagnosis not present

## 2022-06-25 DIAGNOSIS — N186 End stage renal disease: Secondary | ICD-10-CM

## 2022-06-25 DIAGNOSIS — K828 Other specified diseases of gallbladder: Secondary | ICD-10-CM | POA: Diagnosis not present

## 2022-06-25 DIAGNOSIS — I120 Hypertensive chronic kidney disease with stage 5 chronic kidney disease or end stage renal disease: Secondary | ICD-10-CM

## 2022-06-25 DIAGNOSIS — I4892 Unspecified atrial flutter: Secondary | ICD-10-CM | POA: Diagnosis not present

## 2022-06-25 HISTORY — PX: OPEN PARTIAL HEPATECTOMY [83]: SHX5987

## 2022-06-25 HISTORY — PX: CHOLECYSTECTOMY: SHX55

## 2022-06-25 HISTORY — PX: LAPAROSCOPY: SHX197

## 2022-06-25 LAB — BASIC METABOLIC PANEL
Anion gap: 11 (ref 5–15)
BUN: 14 mg/dL (ref 8–23)
CO2: 29 mmol/L (ref 22–32)
Calcium: 9.4 mg/dL (ref 8.9–10.3)
Chloride: 93 mmol/L — ABNORMAL LOW (ref 98–111)
Creatinine, Ser: 3.72 mg/dL — ABNORMAL HIGH (ref 0.44–1.00)
GFR, Estimated: 12 mL/min — ABNORMAL LOW (ref >60)
Glucose, Bld: 130 mg/dL — ABNORMAL HIGH (ref 70–99)
Potassium: 3.7 mmol/L (ref 3.5–5.1)
Sodium: 133 mmol/L — ABNORMAL LOW (ref 135–145)

## 2022-06-25 LAB — CBC
HCT: 32.5 % — ABNORMAL LOW (ref 36.0–46.0)
Hemoglobin: 10.7 g/dL — ABNORMAL LOW (ref 12.0–15.0)
MCH: 31.9 pg (ref 26.0–34.0)
MCHC: 32.9 g/dL (ref 30.0–36.0)
MCV: 97 fL (ref 80.0–100.0)
Platelets: 209 10*3/uL (ref 150–400)
RBC: 3.35 MIL/uL — ABNORMAL LOW (ref 3.87–5.11)
RDW: 18.8 % — ABNORMAL HIGH (ref 11.5–15.5)
WBC: 5.2 10*3/uL (ref 4.0–10.5)
nRBC: 0 % (ref 0.0–0.2)

## 2022-06-25 SURGERY — CHOLECYSTECTOMY
Anesthesia: General

## 2022-06-25 MED ORDER — CEFAZOLIN SODIUM-DEXTROSE 2-4 GM/100ML-% IV SOLN
INTRAVENOUS | Status: AC
  Filled 2022-06-25: qty 100

## 2022-06-25 MED ORDER — FENTANYL CITRATE (PF) 250 MCG/5ML IJ SOLN
INTRAMUSCULAR | Status: AC
  Filled 2022-06-25: qty 5

## 2022-06-25 MED ORDER — EPHEDRINE SULFATE-NACL 50-0.9 MG/10ML-% IV SOSY
PREFILLED_SYRINGE | INTRAVENOUS | Status: DC | PRN
  Administered 2022-06-25: 5 mg via INTRAVENOUS

## 2022-06-25 MED ORDER — LIDOCAINE HCL 1 % IJ SOLN
INTRAMUSCULAR | Status: AC
  Filled 2022-06-25: qty 20

## 2022-06-25 MED ORDER — SODIUM CHLORIDE 0.9 % IV SOLN: INTRAVENOUS | Status: DC

## 2022-06-25 MED ORDER — FENTANYL CITRATE (PF) 250 MCG/5ML IJ SOLN
INTRAMUSCULAR | Status: DC | PRN
  Administered 2022-06-25 (×2): 25 ug via INTRAVENOUS
  Administered 2022-06-25: 50 ug via INTRAVENOUS
  Administered 2022-06-25: 25 ug via INTRAVENOUS

## 2022-06-25 MED ORDER — SUGAMMADEX SODIUM 200 MG/2ML IV SOLN
INTRAVENOUS | Status: DC | PRN
  Administered 2022-06-25: 200 mg via INTRAVENOUS

## 2022-06-25 MED ORDER — FENTANYL CITRATE (PF) 100 MCG/2ML IJ SOLN
25.0000 ug | INTRAMUSCULAR | Status: DC | PRN
  Administered 2022-06-25: 25 ug via INTRAVENOUS

## 2022-06-25 MED ORDER — DOCUSATE SODIUM 100 MG PO CAPS
100.0000 mg | ORAL_CAPSULE | Freq: Two times a day (BID) | ORAL | Status: DC
Start: 2022-06-25 — End: 2022-06-30
  Administered 2022-06-25 – 2022-06-28 (×8): 100 mg via ORAL
  Filled 2022-06-25 (×10): qty 1

## 2022-06-25 MED ORDER — PHENYLEPHRINE 80 MCG/ML (10ML) SYRINGE FOR IV PUSH (FOR BLOOD PRESSURE SUPPORT)
PREFILLED_SYRINGE | INTRAVENOUS | Status: DC | PRN
  Administered 2022-06-25 (×2): 240 ug via INTRAVENOUS
  Administered 2022-06-25: 160 ug via INTRAVENOUS

## 2022-06-25 MED ORDER — ORAL CARE MOUTH RINSE
15.0000 mL | Freq: Once | OROMUCOSAL | Status: AC
Start: 2022-06-25 — End: 2022-06-25

## 2022-06-25 MED ORDER — PROMETHAZINE HCL 25 MG/ML IJ SOLN: 6.2500 mg | INTRAMUSCULAR | Status: DC | PRN

## 2022-06-25 MED ORDER — SODIUM CHLORIDE 0.9 % IR SOLN
Status: DC | PRN
  Administered 2022-06-25: 1000 mL

## 2022-06-25 MED ORDER — PHENYLEPHRINE HCL-NACL 20-0.9 MG/250ML-% IV SOLN
INTRAVENOUS | Status: DC | PRN
  Administered 2022-06-25: 40 ug/min via INTRAVENOUS

## 2022-06-25 MED ORDER — ONDANSETRON HCL 4 MG/2ML IJ SOLN: 4.0000 mg | Freq: Four times a day (QID) | INTRAMUSCULAR | Status: DC | PRN

## 2022-06-25 MED ORDER — PROPOFOL 10 MG/ML IV BOLUS
INTRAVENOUS | Status: DC | PRN
  Administered 2022-06-25: 150 mg via INTRAVENOUS

## 2022-06-25 MED ORDER — CHLORHEXIDINE GLUCONATE 0.12 % MT SOLN
15.0000 mL | Freq: Once | OROMUCOSAL | Status: AC
  Administered 2022-06-25: 15 mL via OROMUCOSAL
  Filled 2022-06-25: qty 15

## 2022-06-25 MED ORDER — CEFAZOLIN SODIUM-DEXTROSE 2-4 GM/100ML-% IV SOLN
2.0000 g | INTRAVENOUS | Status: AC
  Administered 2022-06-25: 2 g via INTRAVENOUS

## 2022-06-25 MED ORDER — ROCURONIUM BROMIDE 10 MG/ML (PF) SYRINGE
PREFILLED_SYRINGE | INTRAVENOUS | Status: DC | PRN
  Administered 2022-06-25: 20 mg via INTRAVENOUS
  Administered 2022-06-25: 50 mg via INTRAVENOUS
  Administered 2022-06-25: 20 mg via INTRAVENOUS

## 2022-06-25 MED ORDER — OXYCODONE HCL 5 MG PO TABS
5.0000 mg | ORAL_TABLET | Freq: Four times a day (QID) | ORAL | Status: DC | PRN
  Administered 2022-06-26 – 2022-06-27 (×3): 5 mg via ORAL
  Administered 2022-06-30: 10 mg via ORAL
  Filled 2022-06-25: qty 2
  Filled 2022-06-25 (×2): qty 1
  Filled 2022-06-25: qty 2
  Filled 2022-06-25: qty 1

## 2022-06-25 MED ORDER — DEXAMETHASONE SODIUM PHOSPHATE 10 MG/ML IJ SOLN
INTRAMUSCULAR | Status: DC | PRN
  Administered 2022-06-25: 5 mg via INTRAVENOUS

## 2022-06-25 MED ORDER — LIDOCAINE 2% (20 MG/ML) 5 ML SYRINGE
INTRAMUSCULAR | Status: DC | PRN
  Administered 2022-06-25: 60 mg via INTRAVENOUS

## 2022-06-25 MED ORDER — SODIUM CHLORIDE 0.9 % IV SOLN: INTRAVENOUS | Status: DC | PRN

## 2022-06-25 MED ORDER — ACETAMINOPHEN 325 MG PO TABS
650.0000 mg | ORAL_TABLET | Freq: Four times a day (QID) | ORAL | Status: DC
  Administered 2022-06-25 – 2022-06-30 (×18): 650 mg via ORAL
  Filled 2022-06-25 (×18): qty 2

## 2022-06-25 MED ORDER — FENTANYL CITRATE (PF) 100 MCG/2ML IJ SOLN
INTRAMUSCULAR | Status: AC
  Administered 2022-06-25: 25 ug via INTRAVENOUS
  Filled 2022-06-25: qty 2

## 2022-06-25 MED ORDER — LACTATED RINGERS IV SOLN: INTRAVENOUS | Status: DC

## 2022-06-25 MED ORDER — 0.9 % SODIUM CHLORIDE (POUR BTL) OPTIME
TOPICAL | Status: DC | PRN
  Administered 2022-06-25: 2000 mL

## 2022-06-25 MED ORDER — METRONIDAZOLE 500 MG/100ML IV SOLN
500.0000 mg | INTRAVENOUS | Status: AC
Start: 2022-06-25 — End: 2022-06-25
  Administered 2022-06-25: 500 mg via INTRAVENOUS

## 2022-06-25 MED ORDER — METRONIDAZOLE 500 MG/100ML IV SOLN
INTRAVENOUS | Status: AC
  Filled 2022-06-25: qty 100

## 2022-06-25 SURGICAL SUPPLY — 106 items
ADH SKN CLS APL DERMABOND .7 (GAUZE/BANDAGES/DRESSINGS) ×1
AGENT HMST 10 BLLW SHRT CANN (HEMOSTASIS) ×2
APL PRP STRL LF DISP 70% ISPRP (MISCELLANEOUS) ×1
BAG COUNTER SPONGE SURGICOUNT (BAG) ×2 IMPLANT
BAG SPNG CNTER NS LX DISP (BAG) ×1
BIOPATCH RED 1 DISK 7.0 (GAUZE/BANDAGES/DRESSINGS) ×3 IMPLANT
BLADE CLIPPER SURG (BLADE) ×1 IMPLANT
BOOT SUTURE AID YELLOW STND (SUTURE) IMPLANT
CANISTER SUCT 3000ML PPV (MISCELLANEOUS) ×2 IMPLANT
CHLORAPREP W/TINT 26 (MISCELLANEOUS) ×2 IMPLANT
CLIP TI WIDE RED SMALL 24 (CLIP) ×2 IMPLANT
CLIP VESOCCLUDE LG 6/CT (CLIP) ×1 IMPLANT
CLIP VESOCCLUDE MED 24/CT (CLIP) ×2 IMPLANT
CLIP VESOCCLUDE MED 6/CT (CLIP) ×2 IMPLANT
CNTNR URN SCR LID CUP LEK RST (MISCELLANEOUS) IMPLANT
CONT SPEC 4OZ STRL OR WHT (MISCELLANEOUS) ×10
COVER SURGICAL LIGHT HANDLE (MISCELLANEOUS) ×2 IMPLANT
DERMABOND ADVANCED (GAUZE/BANDAGES/DRESSINGS) ×1
DERMABOND ADVANCED .7 DNX12 (GAUZE/BANDAGES/DRESSINGS) ×2 IMPLANT
DRAIN CHANNEL 19F RND (DRAIN) ×2 IMPLANT
DRAPE INCISE IOBAN 66X45 STRL (DRAPES) ×2 IMPLANT
DRAPE LAPAROSCOPIC ABDOMINAL (DRAPES) ×2 IMPLANT
DRAPE WARM FLUID 44X44 (DRAPES) ×2 IMPLANT
DRSG TEGADERM 4X4.5 CHG (GAUZE/BANDAGES/DRESSINGS) ×1 IMPLANT
DRSG TEGADERM 4X4.75 (GAUZE/BANDAGES/DRESSINGS) ×5 IMPLANT
DRSG TELFA 3X8 NADH (GAUZE/BANDAGES/DRESSINGS) ×2 IMPLANT
ELECT BLADE 4.0 EZ CLEAN MEGAD (MISCELLANEOUS)
ELECT BLADE 6.5 EXT (BLADE) ×2 IMPLANT
ELECT CAUTERY BLADE 6.4 (BLADE) ×2 IMPLANT
ELECT PAD DSPR THERM+ ADLT (MISCELLANEOUS) ×4 IMPLANT
ELECT REM PT RETURN 9FT ADLT (ELECTROSURGICAL) ×2
ELECTRODE BLDE 4.0 EZ CLN MEGD (MISCELLANEOUS) IMPLANT
ELECTRODE REM PT RTRN 9FT ADLT (ELECTROSURGICAL) ×1 IMPLANT
EVACUATOR SILICONE 100CC (DRAIN) ×3 IMPLANT
GAS ARGON HIGH PRESSURE (MEDICAL GASES) ×1 IMPLANT
GAUZE 4X4 16PLY ~~LOC~~+RFID DBL (SPONGE) IMPLANT
GAUZE SPONGE 2X2 8PLY STRL LF (GAUZE/BANDAGES/DRESSINGS) IMPLANT
GAUZE SPONGE 4X4 12PLY STRL (GAUZE/BANDAGES/DRESSINGS) IMPLANT
GLOVE BIOGEL PI IND STRL 6 (GLOVE) ×1 IMPLANT
GLOVE BIOGEL PI INDICATOR 6 (GLOVE) ×1
GLOVE BIOGEL PI MICRO 5.5 (GLOVE) ×2
GLOVE BIOGEL PI MICRO STRL 5.5 (GLOVE) ×2 IMPLANT
GLOVE SURG POLY MICRO LF SZ5.5 (GLOVE) ×2 IMPLANT
GLOVE SURG UNDER POLY LF SZ6 (GLOVE) ×2 IMPLANT
GOWN STRL REUS W/ TWL LRG LVL3 (GOWN DISPOSABLE) ×2 IMPLANT
GOWN STRL REUS W/TWL LRG LVL3 (GOWN DISPOSABLE) ×4
HAND PENCIL TRP OPTION (MISCELLANEOUS) ×3 IMPLANT
HANDLE SUCTION POOLE (INSTRUMENTS) ×1 IMPLANT
HEMOSTAT HEMOBLAST BELLOWS (HEMOSTASIS) ×2 IMPLANT
HEMOSTAT SNOW SURGICEL 2X4 (HEMOSTASIS) ×5 IMPLANT
KIT BASIN OR (CUSTOM PROCEDURE TRAY) ×2 IMPLANT
KIT TURNOVER KIT B (KITS) ×2 IMPLANT
LIGASURE IMPACT 36 18CM CVD LR (INSTRUMENTS) IMPLANT
NDL INSUFFLATION 14GA 120MM (NEEDLE) ×1 IMPLANT
NEEDLE INSUFFLATION 14GA 120MM (NEEDLE) ×2 IMPLANT
NS IRRIG 1000ML POUR BTL (IV SOLUTION) ×4 IMPLANT
PACK GENERAL/GYN (CUSTOM PROCEDURE TRAY) ×2 IMPLANT
PAD ARMBOARD 7.5X6 YLW CONV (MISCELLANEOUS) ×4 IMPLANT
PAD DRESSING TELFA 3X8 NADH (GAUZE/BANDAGES/DRESSINGS) IMPLANT
PENCIL SMOKE EVACUATOR (MISCELLANEOUS) ×2 IMPLANT
PROBE LASER ARGON (MISCELLANEOUS) ×1 IMPLANT
RETRACTOR WOUND ALXS 34CM XLRG (MISCELLANEOUS) IMPLANT
RTRCTR WOUND ALEXIS 34CM XLRG (MISCELLANEOUS) ×2
SCISSORS LAP 5X35 DISP (ENDOMECHANICALS) IMPLANT
SEALER BIPOLAR AQUA 6.0 (INSTRUMENTS) ×2 IMPLANT
SET IRRIG TUBING LAPAROSCOPIC (IRRIGATION / IRRIGATOR) IMPLANT
SET TUBE SMOKE EVAC HIGH FLOW (TUBING) ×2 IMPLANT
SLEEVE ENDOPATH XCEL 5M (ENDOMECHANICALS) ×2 IMPLANT
SPECIMEN JAR SMALL (MISCELLANEOUS) ×2 IMPLANT
SPONGE GAUZE 2X2 STER 10/PKG (GAUZE/BANDAGES/DRESSINGS) ×1
SPONGE T-LAP 18X18 ~~LOC~~+RFID (SPONGE) ×3 IMPLANT
SUCTION POOLE HANDLE (INSTRUMENTS) ×2
SUT CHROMIC 0 BP (SUTURE) ×3 IMPLANT
SUT CHROMIC 3 0 CT 36 (SUTURE) IMPLANT
SUT ETHILON 2 0 FS 18 (SUTURE) ×5 IMPLANT
SUT MNCRL AB 4-0 PS2 18 (SUTURE) ×6 IMPLANT
SUT PDS AB 1 TP1 96 (SUTURE) ×6 IMPLANT
SUT PDS AB 2-0 CT1 27 (SUTURE) IMPLANT
SUT PDS AB 3-0 SH 27 (SUTURE) ×1 IMPLANT
SUT PDS AB 4-0 RB1 27 (SUTURE) ×1 IMPLANT
SUT PROLENE 2 0 SH DA (SUTURE) IMPLANT
SUT PROLENE 3 0 SH 48 (SUTURE) ×2 IMPLANT
SUT PROLENE 3 0 SH DA (SUTURE) IMPLANT
SUT PROLENE 4 0 RB 1 (SUTURE)
SUT PROLENE 4-0 RB1 .5 CRCL 36 (SUTURE) ×2 IMPLANT
SUT PROLENE 5 0 RB 1 DA (SUTURE) IMPLANT
SUT SILK 2 0 SH (SUTURE) ×1 IMPLANT
SUT SILK 2 0 TIES 10X30 (SUTURE) ×2 IMPLANT
SUT SILK 2 0SH CR/8 30 (SUTURE) IMPLANT
SUT SILK 3 0 SH 30 (SUTURE) ×1 IMPLANT
SUT SILK 3 0 TIES 10X30 (SUTURE) ×2 IMPLANT
SUT SILK 3 0SH CR/8 30 (SUTURE) IMPLANT
SUT VIC AB 3-0 SH 27 (SUTURE) ×10
SUT VIC AB 3-0 SH 27X BRD (SUTURE) ×1 IMPLANT
SUT VIC AB 3-0 SH 27XBRD (SUTURE) ×1 IMPLANT
SYR BULB IRRIG 60ML STRL (SYRINGE) ×2 IMPLANT
TOWEL GREEN STERILE (TOWEL DISPOSABLE) ×2 IMPLANT
TOWEL GREEN STERILE FF (TOWEL DISPOSABLE) ×2 IMPLANT
TRAY FOLEY MTR SLVR 14FR STAT (SET/KITS/TRAYS/PACK) ×1 IMPLANT
TRAY FOLEY W/BAG SLVR 14FR (SET/KITS/TRAYS/PACK) ×2 IMPLANT
TRAY LAPAROSCOPIC MC (CUSTOM PROCEDURE TRAY) ×2 IMPLANT
TROCAR XCEL BLUNT TIP 100MML (ENDOMECHANICALS) IMPLANT
TROCAR XCEL NON-BLD 5MMX100MML (ENDOMECHANICALS) ×3 IMPLANT
TUBE CONNECTING 12X1/4 (SUCTIONS) IMPLANT
WARMER LAPAROSCOPE (MISCELLANEOUS) ×2 IMPLANT
WATER STERILE IRR 1000ML POUR (IV SOLUTION) ×2 IMPLANT

## 2022-06-25 NOTE — Anesthesia Preprocedure Evaluation (Signed)
Anesthesia Evaluation  Patient identified by MRN, date of birth, ID band Patient awake    Reviewed: Allergy & Precautions, NPO status , Patient's Chart, lab work & pertinent test results  Airway Mallampati: II  TM Distance: >3 FB Neck ROM: Full    Dental  (+) Dental Advisory Given   Pulmonary asthma ,    breath sounds clear to auscultation       Cardiovascular hypertension, Pt. on medications + dysrhythmias  Rhythm:Regular Rate:Normal     Neuro/Psych negative neurological ROS     GI/Hepatic negative GI ROS, (+) Hepatitis -, C  Endo/Other  negative endocrine ROS  Renal/GU CRF, ESRF and DialysisRenal disease     Musculoskeletal  (+) Arthritis ,   Abdominal   Peds  Hematology  (+) Blood dyscrasia, anemia ,   Anesthesia Other Findings   Reproductive/Obstetrics                             Lab Results  Component Value Date   WBC 5.2 06/25/2022   HGB 10.7 (L) 06/25/2022   HCT 32.5 (L) 06/25/2022   MCV 97.0 06/25/2022   PLT 209 06/25/2022   Lab Results  Component Value Date   CREATININE 3.72 (H) 06/25/2022   BUN 14 06/25/2022   NA 133 (L) 06/25/2022   K 3.7 06/25/2022   CL 93 (L) 06/25/2022   CO2 29 06/25/2022    Anesthesia Physical Anesthesia Plan  ASA: 3  Anesthesia Plan: General   Post-op Pain Management: Ketamine IV*   Induction: Intravenous  PONV Risk Score and Plan: 3 and Dexamethasone, Ondansetron and Treatment may vary due to age or medical condition  Airway Management Planned: Oral ETT  Additional Equipment: Arterial line  Intra-op Plan:   Post-operative Plan: Extubation in OR  Informed Consent: I have reviewed the patients History and Physical, chart, labs and discussed the procedure including the risks, benefits and alternatives for the proposed anesthesia with the patient or authorized representative who has indicated his/her understanding and acceptance.      Dental advisory given  Plan Discussed with: CRNA  Anesthesia Plan Comments:         Anesthesia Quick Evaluation

## 2022-06-25 NOTE — Op Note (Signed)
Date: 06/25/22  Patient: Diamond [name] MRN: [id number]  Preoperative Diagnosis: Gallbladder mass Postoperative Diagnosis: Same  Procedure:  Staging laparoscopy Open radical cholecystectomy Portal lymph node dissection  Surgeon: Michaelle Birks, MD Assistant: Ewell Poe, RNFA  EBL: 200 mL  Anesthesia: General endotracheal  Specimens:  Cystic duct margin Gallbladder with gallbladder fossa Additional gallbladder fossa margin Portal lymph nodes  Indications: [name] is a 71 yo female with ESRD on hemodialysis, who is admitted with recurrent bacteremia. She was incidentally noted during a previous admission to have a mass in the fundus of the gallbladder, highly suspicious for adenocarcinoma. Staging workup did not show any signs of metastatic disease. Her bacteremia has cleared and she is medically stable. After multiple discussions regarding treatment of gallbladder cancer, she has agreed to proceed with surgery.  Findings: Firm mass in the fundus of the gallbladder highly suspicious for malignancy. Multiple large stones within the gallbladder with evidence of chronic cholecystitis. Cystic duct margin was negative for malignancy on frozen section. Enlarged but soft periportal lymph nodes.  Procedure details: Informed consent was obtained in the preoperative area prior to the procedure. The patient was brought to the operating room and placed on the table in the supine position. General anesthesia was induced and appropriate lines and drains were placed for intraoperative monitoring. Perioperative antibiotics were administered per SCIP guidelines. The abdomen was prepped and draped in the usual sterile fashion. A pre-procedure timeout was taken verifying patient identity, surgical site and procedure to be performed.  A small supraumbilical skin incision was made, the umbilical stalk was grasped and elevated, and Veress needle was inserted through the fascia.  Intraperitoneal placement  was confirmed with the saline drop test and the abdomen was insufflated.  A 5 mm port was placed and the abdomen was inspected with no evidence of visceral vascular injury.  An additional 5 mm port was placed under direct visualization in the upper midline.  The abdomen was examined.  There were no nodules on the surface of the liver.  There were also no peritoneal nodules and the bowel was grossly normal in appearance.  The words were removed and the abdomen was desufflated.  An upper midline skin incision was made incorporating both port sites.  The subcutaneous tissue was divided with cautery to expose the fascia, and the fascia was opened along the linea alba.  The falciform ligament was ligated with 2-0 silk ties and taken down off the abdominal wall.  An IT sales professional and Bookwalter fix retractor were placed.  The surface of the liver was palpated and there were no nodules or masses.  The gallbladder wall was very thickened with palpable large stones within the lumen, and there was a firm mass at the fundus consistent with a malignant lesion.  The cystic duct was circumferentially dissected out.  It was dissected out more distally near its insertion into the common bile duct.  The cystic duct was ligated proximally with a 2-0 silk tie.  The distal end was then clamped near the insertion of the common bile duct and divided sharply.  The distal margin of tissue was sharply excised and sent for frozen section, which was negative for malignant cells.  The cystic duct stump was closed with a 4-0 PDS suture ligature.  The cystic artery was circumferentially dissected out and ligated with a 2-0 silk tie.  A margin of liver tissue around the gallbladder was then marked out on the surface of the liver with cautery.  0  chromic stay sutures were placed on either side of the planned specimen.  An en bloc resection of the gallbladder fossa and the gallbladder were then performed.  The liver parenchyma was  divided using a crush-clamp technique and the Aquamantys to maintain hemostasis.  Visible vessels were clipped prior to division.  The gallbladder and adjacent liver parenchyma were then sent for routine pathology.  An additional margin of liver tissue was sent more inferiorly from the gallbladder fossa, as this had inadvertently torn off the remainder of the specimen.  Hemostasis was achieved in the gallbladder fossa using the argon and Aquamantys.  Next the porta hepatis was palpated.  There were multiple enlarged lymph nodes but they were soft.  The station 8 lymph node was separated from the common hepatic artery using gentle blunt dissection.  Visible vessels were clipped prior to division.  Two large lymph nodes were identified and dissected off the medial side of the porta, and the inferolateral aspect near the duodenum and head of the pancreas.  All lymph nodes were sent for routine pathology as portal lymph nodes.  Hemostasis was achieved using argon, taking care to avoid the portal vein and hepatic artery.  There was still some raw surface oozing after the completion of the lymph node dissection.  Hemostasis was achieved with application of hemoblast and manual pressure.  A 19 [place] JP drain was placed adjacent to the cut surface of the liver and brought out through the right lateral abdomen demented.  It was secured to the skin with a 2-0 nylon suture.  The retractors and wound protector were removed.  The fascia was closed at midline using a running looped 1 PDS suture.  Scarpa's layer was closed with a running 3-0 Vicryl suture, and the skin was closed with a running subcuticular 4-0 Monocryl suture.  Dermabond was applied.  The patient tolerated the procedure well with no apparent complications.  All counts were correct x2 at the end of the procedure. The patient was extubated and taken to PACU in stable condition.  Michaelle Birks, MD 06/25/22 12:27 PM

## 2022-06-25 NOTE — Anesthesia Procedure Notes (Signed)
Procedure Name: Intubation Date/Time: 06/25/2022 9:23 AM  Performed by: Thelma Comp, CRNAPre-anesthesia Checklist: Patient identified, Emergency Drugs available, Suction available and Patient being monitored Patient Re-evaluated:Patient Re-evaluated prior to induction Oxygen Delivery Method: Circle System Utilized Preoxygenation: Pre-oxygenation with 100% oxygen Induction Type: IV induction Ventilation: Mask ventilation without difficulty Laryngoscope Size: Mac and 3 Grade View: Grade I Tube type: Oral Tube size: 7.0 mm Number of attempts: 1 Airway Equipment and Method: Stylet Placement Confirmation: ETT inserted through vocal cords under direct vision, positive ETCO2 and breath sounds checked- equal and bilateral Secured at: 21 cm Tube secured with: Tape Dental Injury: Teeth and Oropharynx as per pre-operative assessment

## 2022-06-25 NOTE — Progress Notes (Signed)
Day of Surgery  Subjective: No acute changes. Heparin gtt off.    Objective: Vital signs in last 24 hours: Temp:  [97.7 F (36.5 C)-98.4 F (36.9 C)] 98.4 F (36.9 C) (07/21 0752) Pulse Rate:  [60-100] 68 (07/21 0752) Resp:  [14-22] 18 (07/21 0752) BP: (109-178)/(55-110) 137/88 (07/21 0752) SpO2:  [89 %-100 %] 98 % (07/21 0752) Weight:  [58 kg] 58 kg (07/21 0752) Last BM Date : 06/24/22  Intake/Output from previous day: No intake/output data recorded. Intake/Output this shift: No intake/output data recorded.  PE: General: resting comfortably, NAD Neuro: alert and oriented, no focal deficits Resp: normal work of breathing on room air Abdomen: soft, nondistended, nontender to palpation. Extremities: warm and well-perfused   Lab Results:  Recent Labs    06/24/22 0435 06/25/22 0432  WBC 5.2 5.2  HGB 10.1* 10.7*  HCT 31.2* 32.5*  PLT 196 209   BMET Recent Labs    06/23/22 1358 06/25/22 0432  NA 134* 133*  K 4.7 3.7  CL 95* 93*  CO2 26 29  GLUCOSE 99 130*  BUN 22 14  CREATININE 4.86* 3.72*  CALCIUM 9.8 9.4   PT/INR No results for input(s): "LABPROT", "INR" in the last 72 hours. CMP     Component Value Date/Time   NA 133 (L) 06/25/2022 0432   K 3.7 06/25/2022 0432   CL 93 (L) 06/25/2022 0432   CO2 29 06/25/2022 0432   GLUCOSE 130 (H) 06/25/2022 0432   BUN 14 06/25/2022 0432   CREATININE 3.72 (H) 06/25/2022 0432   CREATININE 5.76 (H) 12/31/2020 1456   CALCIUM 9.4 06/25/2022 0432   PROT 7.0 06/12/2022 1545   ALBUMIN 2.5 (L) 06/23/2022 1358   AST 16 06/12/2022 1545   ALT 10 06/12/2022 1545   ALKPHOS 56 06/12/2022 1545   BILITOT 1.1 06/12/2022 1545   GFRNONAA 12 (L) 06/25/2022 0432   GFRAA 14 (L) 11/12/2019 0344   Lipase     Component Value Date/Time   LIPASE 52 (H) 06/12/2022 1545       Studies/Results: No results found.  Anti-infectives: Anti-infectives (From admission, onward)    Start     Dose/Rate Route Frequency Ordered  Stop   06/25/22 0815  ceFAZolin (ANCEF) IVPB 2g/100 mL premix       See Hyperspace for full Linked Orders Report.   2 g 200 mL/hr over 30 Minutes Intravenous On call to O.R. 06/25/22 0813 06/26/22 0559   06/25/22 0815  metroNIDAZOLE (FLAGYL) IVPB 500 mg       See Hyperspace for full Linked Orders Report.   500 mg 100 mL/hr over 60 Minutes Intravenous On call to O.R. 06/25/22 0813 06/26/22 0559   06/15/22 1200  [MAR Hold]  vancomycin (VANCOREADY) IVPB 500 mg/100 mL        (MAR Hold since Fri 06/25/2022 at 0742.Hold Reason: Transfer to a Procedural area)   500 mg 100 mL/hr over 60 Minutes Intravenous Every T-Th-Sa (Hemodialysis) 06/15/22 1028 07/13/22 2359   06/14/22 1200  vancomycin (VANCOREADY) IVPB 500 mg/100 mL        500 mg 100 mL/hr over 60 Minutes Intravenous Every M-W-F (Hemodialysis) 06/14/22 0814 06/14/22 1714   06/13/22 1130  cefTRIAXone (ROCEPHIN) 2 g in sodium chloride 0.9 % 100 mL IVPB  Status:  Discontinued        2 g 200 mL/hr over 30 Minutes Intravenous Every 24 hours 06/13/22 1118 06/13/22 1249   06/13/22 0800  vancomycin (VANCOREADY) IVPB 1250 mg/250 mL  1,250 mg 166.7 mL/hr over 90 Minutes Intravenous  Once 06/13/22 0757 06/13/22 1223   06/13/22 0757  vancomycin variable dose per unstable renal function (pharmacist dosing)  Status:  Discontinued         Does not apply See admin instructions 06/13/22 0757 06/15/22 1029        Assessment/Plan  71 yo female with gallbladder mass, highly suspicious for adenocarcinoma.  - Proceed to OR today for staging laparoscopy and open radical cholecystectomy. I again reviewed the planned procedure with the patient and she agrees to proceed. - Will continue to hold anticoagulation postop - 2u PRBCs on hold - NPO for surgery - I have attempted numerous times to call the patient's son with no answer. I left a voicemail this morning. Patient is aware.   LOS: 13 days    Michaelle Birks, MD [facility] General,  Hepatobiliary and Pancreatic Surgery 06/25/22 8:22 AM

## 2022-06-25 NOTE — Plan of Care (Signed)
  Problem: Clinical Measurements: Goal: Ability to maintain clinical measurements within normal limits will improve Outcome: Progressing Goal: Will remain free from infection Outcome: Progressing Goal: Respiratory complications will improve Outcome: Progressing Goal: Cardiovascular complication will be avoided Outcome: Progressing   

## 2022-06-25 NOTE — Progress Notes (Addendum)
  Progress Note   Patient: Diamond [name] GUR:[id number] DOB: 10-03-1951 DOA: 06/12/2022     13 DOS: the patient was seen and examined on 06/25/2022   Brief hospital course: 71 year old woman PMH including ESRD presented with nausea, vomiting, diarrhea, admitted for generalized abdominal pain, gallbladder mass, hyperkalemia and acute metabolic encephalopathy. Found to have MRSA bacteremia, seen by ID with ongoing IV antibiotics.  Seen by general surgery and underwent radical cholecystectomy 7/21.  Specimen suspicious for malignancy.  Assessment and Plan: * MRSA bacteremia -- Thought secondary to HD catheter, also has PMH MRSA bacteremia, L5-S1 discitis and endocarditis 6 months prior. --Dialysis catheter removed 7/10 and culture tip positive for MRSA. TEE negative for vegetation but but similar very small mobile echodensity seen in RVOT which was better visualized on prior TTE on 03/03/2022.  Repeat blood culture 7/11 NG/F ---ID recommended vancomycin with HD through 07/13/2022. Follow-up with ID as an outpatient.   Gallbladder mass --Status post radical cholecystectomy 7/21, concern for malignancy.   ESRD on dialysis [facility]) with hyperkalemia on admission. Anemia of ESRD --AV fistula being used for HD. --Management per nephrology.   Paroxysmal atrial flutter (HCC) -- was on metoprolol prior to admit; had afib rvr and started on dilt gtt, then to PO Cardizem.  Apixaban on hold in anticipation of surgery.  Heparin infusion in the interim, stop for surgery. --back in afib today with tachycardia 7/20, transition back to home metoprolol and back in sinus rhythm at this time. -- No heparin for 48 hours at least per general surgery.   Acute metabolic encephalopathy -- Resolved.  Etiology unclear.   Physical deconditioning --PT, OT recommended SNF but patient hopeful to return home.   Non-severe (moderate) malnutrition in context of chronic illness --per dietician      Subjective:  Feels  ok  Physical Exam: Vitals:   06/25/22 1230 06/25/22 1245 06/25/22 1324 06/25/22 1633  BP: 138/90 (!) 155/88 (!) 138/94 (!) 112/93  Pulse: 68 63 69 78  Resp: 13 15 (!) 23 20  Temp:  97.7 F (36.5 C) 97.7 F (36.5 C) (!) 97.5 F (36.4 C)  TempSrc:   Oral Oral  SpO2: 100% 100% 97% 97%  Weight:      Height:       Physical Exam Vitals reviewed.  Constitutional:      General: She is not in acute distress.    Appearance: She is not ill-appearing or toxic-appearing.  Cardiovascular:     Rate and Rhythm: Normal rate and regular rhythm.     Heart sounds: No murmur heard. Pulmonary:     Effort: Pulmonary effort is normal. No respiratory distress.     Breath sounds: No wheezing, rhonchi or rales.  Musculoskeletal:     Right lower leg: No edema.     Left lower leg: No edema.  Neurological:     Mental Status: She is alert.  Psychiatric:        Mood and Affect: Mood normal.        Behavior: Behavior normal.     Data Reviewed:  BMP noted Hgb stable 10.7  Family Communication: none  Disposition: Status is: Inpatient Remains inpatient appropriate because: s/p surgery today  Planned Discharge Destination: Home    Time spent: 25 minutes, discussed in person with Dr. Zenia Resides  Author: Murray Hodgkins, MD 06/25/2022 8:20 PM  For on call review [email or web address].

## 2022-06-25 NOTE — Progress Notes (Signed)
PT Cancellation Note  Patient Details Name: Diamond [name] MRN: [id number] DOB: [birth year]   Cancelled Treatment:    Reason Eval/Treat Not Completed: Patient at procedure or test/unavailable (Pt down to OR for laparoscopy and open radical cholecystectomy. will follow up at later date/time as schedule allows.)  [place], DPT Acute Rehabilitation Services Office [phone] Pager [phone]  06/25/22 10:00 AM

## 2022-06-25 NOTE — Progress Notes (Addendum)
[facility] Progress Note   Subjective: Seen in PACU 13 S/P open radical cholecystectomy. She is lethargic, not really responding to verbal stimuli. Surgeon at bedside, says it's adenocarcinoma.   Objective Vitals:   06/25/22 0703 06/25/22 0752 06/25/22 1205 06/25/22 1215  BP: 119/72 137/88 (!) 154/93 136/87  Pulse: 65 68 60 65  Resp: 15 18 (!) 24 17  Temp: 98.2 F (36.8 C) 98.4 F (36.9 C) 98.7 F (37.1 C)   TempSrc: Oral Oral    SpO2: 98% 98% 96% 97%  Weight:  58 kg    Height:  '5\' 7"'$  (1.702 m)     Physical Exam General: Chronically ill appearing female in NAD Heart: S1,S2 No M/R/G. SR on monitor.  Lungs: CTAB Anteriorly.  Abdomen: Midline incision with adhesive closure. JP drain RLQ. No BS.  Extremities: No LE edema Dialysis Access: L AVF + T/B.    Additional Objective Labs: Basic Metabolic Panel: Recent Labs  Lab 06/21/22 0643 06/22/22 0800 06/23/22 1358 06/25/22 0432  NA 133* 135 134* 133*  K 4.7 5.0 4.7 3.7  CL 97* 97* 95* 93*  CO2 '25 27 26 29  '$ GLUCOSE 89 104* 99 130*  BUN 28* 39* 22 14  CREATININE 5.40* 7.70* 4.86* 3.72*  CALCIUM 9.4 9.3 9.8 9.4  PHOS 5.4* 6.5* 4.3  --    Liver Function Tests: Recent Labs  Lab 06/21/22 0643 06/22/22 0800 06/23/22 1358  ALBUMIN 2.4* 2.5* 2.5*   No results for input(s): "LIPASE", "AMYLASE" in the last 168 hours. CBC: Recent Labs  Lab 06/22/22 0511 06/23/22 0445 06/23/22 1358 06/24/22 0435 06/25/22 0432  WBC 4.3 6.2 6.2 5.2 5.2  NEUTROABS  --  4.3  --  3.6  --   HGB 9.8* 10.0* 9.8* 10.1* 10.7*  HCT 30.0* 29.7* 29.0* 31.2* 32.5*  MCV 96.8 95.8 96.3 99.0 97.0  PLT 212 213 185 196 209   Blood Culture    Component Value Date/Time   SDES BLOOD BLOOD RIGHT WRIST 06/15/2022 1021   SPECREQUEST AEROBIC BOTTLE ONLY Blood Culture adequate volume 06/15/2022 1021   CULT  06/15/2022 1021    NO GROWTH 5 DAYS Performed at [facility] [address], [place], [place] [address]    REPTSTATUS  06/20/2022 FINAL 06/15/2022 1021    Cardiac Enzymes: No results for input(s): "CKTOTAL", "CKMB", "CKMBINDEX", "TROPONINI" in the last 168 hours. CBG: No results for input(s): "GLUCAP" in the last 168 hours. Iron Studies: No results for input(s): "IRON", "TIBC", "TRANSFERRIN", "FERRITIN" in the last 72 hours. '@lablastinr3'$ @ Studies/Results: No results found. Medications:  sodium chloride 10 mL/hr at 06/25/22 0908   lactated ringers     [MAR Hold] vancomycin 500 mg (06/24/22 1625)    acetaminophen  650 mg Oral QID   [MAR Hold] Chlorhexidine Gluconate Cloth  6 each Topical Q0600   [MAR Hold] darbepoetin (ARANESP) injection - DIALYSIS  150 mcg Intravenous Q Tue-HD   [MAR Hold] feeding supplement  1 Container Oral TID BM   [MAR Hold] metoprolol tartrate  50 mg Oral BID   [MAR Hold] multivitamin  1 tablet Oral QHS   [MAR Hold] pantoprazole  40 mg Oral BID   [MAR Hold] sevelamer carbonate  1,600 mg Oral TID WC     Dialysis Orders: [place] TTS 4h  60.3kg  400/600  2/2 bath  LUE AVF (TDC removed here) Hep none - mircera 150 mcg IV q2, last 6/24 - hectorol 4 ug iv tts   Home meds: apixaban 2.5  bid, metoprolol 25 bid, pantoprazole, sevelamer carbonate 2 ac tid   Assessment/Plan: MRSA Bacteremia - H/o MRSA bacteremia/endocarditis. IV Vancomycin per primary. ID recommends IV vancomycin with HD for 4 weeks with end date 07/13/22. Had negative TEE/ TDC removed on 7/10 by VVS. Using AVF now.  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 Per [facility].  AMS -now resolved, probably related to infection, no acute findings on head CT.  ESRD -  HD TTS. Next HD 06/26/2022 Volume  - BPs good, no sig vol overload. Now under OP EDW.UF as tolerated. Lower EDW on discharge.  Anemia  - Hgb 10.7 aranesp 164mg given on 7/18. Watch for post op fall in HGB.  Metabolic bone disease -phosphorus has improved.  Continue binders HTN - on diltiazem, bp's good Hx atrial flutter -  getting diltiazem 30 tid here   RVelva HarmanH. Nyomie Ehrlich NP-C 06/25/2022, 12:32 PM  [phone]

## 2022-06-25 NOTE — Progress Notes (Signed)
Diamond [name] arrived to 5w28 room from surgery. JP drained dressing saturated and leaked from bottom of dressing. OR Nurses notified MD S. Allen and dressing is reinforced with ABD pads and paper tape

## 2022-06-25 NOTE — Transfer of Care (Signed)
Immediate Anesthesia Transfer of Care Note  Patient: Diamond [name]  Procedure(s) Performed: OPEN CHOLECYSTECTOMY STAGING LAPAROSCOPY OPEN PARTIAL HEPATECTOMY  Patient Location: PACU  Anesthesia Type:General  Level of Consciousness: drowsy and patient cooperative  Airway & Oxygen Therapy: Patient Spontanous Breathing  Post-op Assessment: Report given to RN and Post -op Vital signs reviewed and stable  Post vital signs: Reviewed and stable  Last Vitals:  Vitals Value Taken Time  BP 154/93 06/25/22 1204  Temp    Pulse 60 06/25/22 1206  Resp 24 06/25/22 1206  SpO2 96 % 06/25/22 1206  Vitals shown include unvalidated device data.  Last Pain:  Vitals:   06/25/22 0821  TempSrc:   PainSc: 0-No pain      Patients Stated Pain Goal: 0 (06/98/61 4830)  Complications: No notable events documented.

## 2022-06-26 DIAGNOSIS — I4892 Unspecified atrial flutter: Secondary | ICD-10-CM | POA: Diagnosis not present

## 2022-06-26 DIAGNOSIS — R7881 Bacteremia: Secondary | ICD-10-CM | POA: Diagnosis not present

## 2022-06-26 DIAGNOSIS — B9562 Methicillin resistant Staphylococcus aureus infection as the cause of diseases classified elsewhere: Secondary | ICD-10-CM | POA: Diagnosis not present

## 2022-06-26 LAB — CBC
HCT: 24.6 % — ABNORMAL LOW (ref 36.0–46.0)
Hemoglobin: 8.1 g/dL — ABNORMAL LOW (ref 12.0–15.0)
MCH: 32 pg (ref 26.0–34.0)
MCHC: 32.9 g/dL (ref 30.0–36.0)
MCV: 97.2 fL (ref 80.0–100.0)
Platelets: 188 10*3/uL (ref 150–400)
RBC: 2.53 MIL/uL — ABNORMAL LOW (ref 3.87–5.11)
RDW: 19.2 % — ABNORMAL HIGH (ref 11.5–15.5)
WBC: 9.9 10*3/uL (ref 4.0–10.5)
nRBC: 0 % (ref 0.0–0.2)

## 2022-06-26 LAB — COMPREHENSIVE METABOLIC PANEL
ALT: 37 U/L (ref 0–44)
AST: 135 U/L — ABNORMAL HIGH (ref 15–41)
Albumin: 2.4 g/dL — ABNORMAL LOW (ref 3.5–5.0)
Alkaline Phosphatase: 77 U/L (ref 38–126)
Anion gap: 13 (ref 5–15)
BUN: 26 mg/dL — ABNORMAL HIGH (ref 8–23)
CO2: 21 mmol/L — ABNORMAL LOW (ref 22–32)
Calcium: 8.9 mg/dL (ref 8.9–10.3)
Chloride: 94 mmol/L — ABNORMAL LOW (ref 98–111)
Creatinine, Ser: 5.22 mg/dL — ABNORMAL HIGH (ref 0.44–1.00)
GFR, Estimated: 8 mL/min — ABNORMAL LOW (ref >60)
Glucose, Bld: 106 mg/dL — ABNORMAL HIGH (ref 70–99)
Potassium: 5.1 mmol/L (ref 3.5–5.1)
Sodium: 128 mmol/L — ABNORMAL LOW (ref 135–145)
Total Bilirubin: 1.2 mg/dL (ref 0.3–1.2)
Total Protein: 5.8 g/dL — ABNORMAL LOW (ref 6.5–8.1)

## 2022-06-26 MED ORDER — PENTAFLUOROPROP-TETRAFLUOROETH EX AERO: 1.0000 | INHALATION_SPRAY | CUTANEOUS | Status: DC | PRN

## 2022-06-26 MED ORDER — ANTICOAGULANT SODIUM CITRATE 4% (200MG/5ML) IV SOLN: 5.0000 mL | Status: DC | PRN

## 2022-06-26 MED ORDER — HEPARIN SODIUM (PORCINE) 1000 UNIT/ML DIALYSIS: 1000.0000 [IU] | INTRAMUSCULAR | Status: DC | PRN

## 2022-06-26 MED ORDER — LIDOCAINE-PRILOCAINE 2.5-2.5 % EX CREA: 1.0000 | TOPICAL_CREAM | CUTANEOUS | Status: DC | PRN

## 2022-06-26 MED ORDER — LIDOCAINE HCL (PF) 1 % IJ SOLN: 5.0000 mL | INTRAMUSCULAR | Status: DC | PRN

## 2022-06-26 MED ORDER — ALTEPLASE 2 MG IJ SOLR: 2.0000 mg | Freq: Once | INTRAMUSCULAR | Status: DC | PRN

## 2022-06-26 NOTE — Progress Notes (Signed)
Received patient in bed, alert and oriented. Informed consent signed and in chart.  Time tx completed: 1640   HD treatment completed. Patient did not tolerate treatment. Complaints of abdominal pain throughout treatment. BP decreased throughout treatment (see vitals trend) Fluid removal turned off for the remainder of treatment at approx 1430. NP made aware of this. Treatment ended approx 20 minutes before scheduled time d/t shortage in bicarb. Vancomycin not given, reported to floor nurse who agreed to give once patient arrives. RUA Fistula without signs and symptoms of complications. Patient transported back to the room, alert and orient and in no acute distress. Report given to bedside RN.  Total UF removed:   +178m  BVP 73.1L   Medication given:  Oxycodone.   Post HD VS:  BP 136/89 HR 75 Resp 16 Temp 97.9  Post HD weight: 57.8 kg

## 2022-06-26 NOTE — Progress Notes (Signed)
[facility] Progress Note   Subjective: Seen in room. PT present as well, negotiating getting OOB. HD later today.     Objective Vitals:   06/26/22 0035 06/26/22 0400 06/26/22 0500 06/26/22 0800  BP: (!) 150/92 (!) 143/88  (!) 141/89  Pulse: 73 70  79  Resp: '14 15  20  '$ Temp: 97.6 F (36.4 C) 97.8 F (36.6 C)  97.8 F (36.6 C)  TempSrc: Oral Oral  Oral  SpO2: 98% 97%  99%  Weight:   64.6 kg   Height:       Physical Exam General: Chronically ill appearing female in NAD Heart: S1,S2 No M/R/G. SR on monitor.  Lungs: CTAB Anteriorly.  Abdomen: Midline incision with adhesive closure. JP drain RLQ. No BS.  Extremities: No LE edema Dialysis Access: L AVF + T/B.   Additional Objective Labs: Basic Metabolic Panel: Recent Labs  Lab 06/21/22 0643 06/22/22 0800 06/23/22 1358 06/25/22 0432 06/26/22 0447  NA 133* 135 134* 133* 128*  K 4.7 5.0 4.7 3.7 5.1  CL 97* 97* 95* 93* 94*  CO2 '25 27 26 29 '$ 21*  GLUCOSE 89 104* 99 130* 106*  BUN 28* 39* 22 14 26*  CREATININE 5.40* 7.70* 4.86* 3.72* 5.22*  CALCIUM 9.4 9.3 9.8 9.4 8.9  PHOS 5.4* 6.5* 4.3  --   --    Liver Function Tests: Recent Labs  Lab 06/22/22 0800 06/23/22 1358 06/26/22 0447  AST  --   --  135*  ALT  --   --  37  ALKPHOS  --   --  77  BILITOT  --   --  1.2  PROT  --   --  5.8*  ALBUMIN 2.5* 2.5* 2.4*   No results for input(s): "LIPASE", "AMYLASE" in the last 168 hours. CBC: Recent Labs  Lab 06/23/22 0445 06/23/22 1358 06/24/22 0435 06/25/22 0432 06/26/22 0447  WBC 6.2 6.2 5.2 5.2 9.9  NEUTROABS 4.3  --  3.6  --   --   HGB 10.0* 9.8* 10.1* 10.7* 8.1*  HCT 29.7* 29.0* 31.2* 32.5* 24.6*  MCV 95.8 96.3 99.0 97.0 97.2  PLT 213 185 196 209 188   Blood Culture    Component Value Date/Time   SDES BLOOD BLOOD RIGHT WRIST 06/15/2022 1021   SPECREQUEST AEROBIC BOTTLE ONLY Blood Culture adequate volume 06/15/2022 1021   CULT  06/15/2022 1021    NO GROWTH 5 DAYS Performed at [facility] [address], [place], [place] [address]    REPTSTATUS 06/20/2022 FINAL 06/15/2022 1021    Cardiac Enzymes: No results for input(s): "CKTOTAL", "CKMB", "CKMBINDEX", "TROPONINI" in the last 168 hours. CBG: No results for input(s): "GLUCAP" in the last 168 hours. Iron Studies: No results for input(s): "IRON", "TIBC", "TRANSFERRIN", "FERRITIN" in the last 72 hours. '@lablastinr3'$ @ Studies/Results: No results found. Medications:  sodium chloride 10 mL/hr at 06/25/22 0908   vancomycin 500 mg (06/24/22 1625)    acetaminophen  650 mg Oral QID   Chlorhexidine Gluconate Cloth  6 each Topical Q0600   darbepoetin (ARANESP) injection - DIALYSIS  150 mcg Intravenous Q Tue-HD   docusate sodium  100 mg Oral BID   feeding supplement  1 Container Oral TID BM   metoprolol tartrate  50 mg Oral BID   multivitamin  1 tablet Oral QHS   pantoprazole  40 mg Oral BID   sevelamer carbonate  1,600 mg Oral TID WC     Dialysis Orders: [place] TTS 4h  60.3kg  400/600  2/2 bath  LUE AVF (TDC removed here) Hep none - mircera 150 mcg IV q2, last 6/24 - hectorol 4 ug iv tts   Home meds: apixaban 2.5 bid, metoprolol 25 bid, pantoprazole, sevelamer carbonate 2 ac tid   Assessment/Plan: MRSA Bacteremia - H/o MRSA bacteremia/endocarditis. IV Vancomycin per primary. ID recommends IV vancomycin with HD for 4 weeks with end date 07/13/22. Had negative TEE/ TDC removed on 7/10 by VVS. Using AVF now.  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 Per [facility].  AMS -now resolved, probably related to infection, no acute findings on head CT.  ESRD -  HD TTS. Next HD 06/26/2022 Volume  - BPs good, no sig vol overload. Now under OP EDW.UF as tolerated. Lower EDW on discharge.  Anemia  - Hgb 10.7 aranesp 128mg given on 7/18. Watch for post op fall in HGB.  Metabolic bone disease -phosphorus has improved.  Continue binders HTN - on diltiazem, bp's good Hx atrial flutter - getting  diltiazem 30 tid here    RVelva HarmanH. Caryn Gienger NP-C 06/26/2022, 10:29 AM  [phone]

## 2022-06-26 NOTE — Progress Notes (Signed)
Complaints of 7/10 abdominal pain post surgery (06/25/21). BP 123/80. Oxycodone given.

## 2022-06-26 NOTE — Progress Notes (Signed)
1 Day Post-Op  Subjective: Some pain today, but controlled.  Tolerating liquids.  Doesn't want to get out of bed.   Objective: Vital signs in last 24 hours: Temp:  [97.5 F (36.4 C)-98.7 F (37.1 C)] 97.8 F (36.6 C) (07/22 0800) Pulse Rate:  [60-82] 79 (07/22 0800) Resp:  [13-24] 20 (07/22 0800) BP: (112-155)/(87-97) 141/89 (07/22 0800) SpO2:  [96 %-100 %] 99 % (07/22 0800) Weight:  [64.6 kg] 64.6 kg (07/22 0500) Last BM Date : 06/22/22  Intake/Output from previous day: 07/21 0701 - 07/22 0700 In: 900 [I.V.:800; IV Piggyback:100] Out: 435 [Drains:135; Blood:300] Intake/Output this shift: No intake/output data recorded.  PE: General: resting comfortably, NAD Resp: normal work of breathing on room air Abdomen: soft, appropriately tender, midline incision is c/d/I, JP drain with large blood clot present and some minimal serosang drainage around the clot.  Leakage around JP drain noted.    Lab Results:  Recent Labs    06/25/22 0432 06/26/22 0447  WBC 5.2 9.9  HGB 10.7* 8.1*  HCT 32.5* 24.6*  PLT 209 188   BMET Recent Labs    06/25/22 0432 06/26/22 0447  NA 133* 128*  K 3.7 5.1  CL 93* 94*  CO2 29 21*  GLUCOSE 130* 106*  BUN 14 26*  CREATININE 3.72* 5.22*  CALCIUM 9.4 8.9   PT/INR No results for input(s): "LABPROT", "INR" in the last 72 hours. CMP     Component Value Date/Time   NA 128 (L) 06/26/2022 0447   K 5.1 06/26/2022 0447   CL 94 (L) 06/26/2022 0447   CO2 21 (L) 06/26/2022 0447   GLUCOSE 106 (H) 06/26/2022 0447   BUN 26 (H) 06/26/2022 0447   CREATININE 5.22 (H) 06/26/2022 0447   CREATININE 5.76 (H) 12/31/2020 1456   CALCIUM 8.9 06/26/2022 0447   PROT 5.8 (L) 06/26/2022 0447   ALBUMIN 2.4 (L) 06/26/2022 0447   AST 135 (H) 06/26/2022 0447   ALT 37 06/26/2022 0447   ALKPHOS 77 06/26/2022 0447   BILITOT 1.2 06/26/2022 0447   GFRNONAA 8 (L) 06/26/2022 0447   GFRAA 14 (L) 11/12/2019 0344   Lipase     Component Value Date/Time    LIPASE 52 (H) 06/12/2022 1545       Studies/Results: No results found.  Anti-infectives: Anti-infectives (From admission, onward)    Start     Dose/Rate Route Frequency Ordered Stop   06/25/22 0827  metroNIDAZOLE (FLAGYL) 500 MG/100ML IVPB       Note to Pharmacy: Cameron Sprang M: cabinet override      06/25/22 0827 06/25/22 0940   06/25/22 0827  ceFAZolin (ANCEF) 2-4 GM/100ML-% IVPB       Note to Pharmacy: Cameron Sprang M: cabinet override      06/25/22 0827 06/25/22 0939   06/25/22 0815  ceFAZolin (ANCEF) IVPB 2g/100 mL premix       See Hyperspace for full Linked Orders Report.   2 g 200 mL/hr over 30 Minutes Intravenous On call to O.R. 06/25/22 0813 06/25/22 0930   06/25/22 0815  metroNIDAZOLE (FLAGYL) IVPB 500 mg       See Hyperspace for full Linked Orders Report.   500 mg 100 mL/hr over 60 Minutes Intravenous On call to O.R. 06/25/22 0813 06/25/22 0930   06/15/22 1200  vancomycin (VANCOREADY) IVPB 500 mg/100 mL        500 mg 100 mL/hr over 60 Minutes Intravenous Every T-Th-Sa (Hemodialysis) 06/15/22 1028 07/13/22 2359   06/14/22 1200  vancomycin (VANCOREADY) IVPB 500 mg/100 mL        500 mg 100 mL/hr over 60 Minutes Intravenous Every M-W-F (Hemodialysis) 06/14/22 0814 06/14/22 1714   06/13/22 1130  cefTRIAXone (ROCEPHIN) 2 g in sodium chloride 0.9 % 100 mL IVPB  Status:  Discontinued        2 g 200 mL/hr over 30 Minutes Intravenous Every 24 hours 06/13/22 1118 06/13/22 1249   06/13/22 0800  vancomycin (VANCOREADY) IVPB 1250 mg/250 mL        1,250 mg 166.7 mL/hr over 90 Minutes Intravenous  Once 06/13/22 0757 06/13/22 1223   06/13/22 0757  vancomycin variable dose per unstable renal function (pharmacist dosing)  Status:  Discontinued         Does not apply See admin instructions 06/13/22 0757 06/15/22 1029        Assessment/Plan  71 yo female with gallbladder mass, highly suspicious for adenocarcinoma. POD 1, s/p open radical cholecystectomy with portal LN  dissection by Dr. Zenia Resides 7/21 - Will continue to hold anticoagulation postop for at least 48 hours.  Had a lot of bleeding intra-op - renal diet - PT/OT to see patient for mobilization - IS/pulm toilet  FEN: renal diet VTE: hold for at least 48 hrs post op ID: none needed from surgical standpoint   LOS: 14 days    Henreitta Cea 06/26/22 9:53 AM

## 2022-06-26 NOTE — Progress Notes (Addendum)
  Progress Note   Patient: Diamond [name] [id number]:[id number] DOB: Dec 21, 1950 DOA: 06/12/2022     14 DOS: the patient was seen and examined on 06/26/2022   Brief hospital course: 71 year old woman PMH including ESRD presented with nausea, vomiting, diarrhea, admitted for generalized abdominal pain, gallbladder mass, hyperkalemia and acute metabolic encephalopathy. Found to have MRSA bacteremia, seen by ID with ongoing IV antibiotics.  Seen by general surgery and underwent radical cholecystectomy 7/21.  Specimen suspicious for malignancy.  Assessment and Plan: * MRSA bacteremia -- Thought secondary to HD catheter, also has PMH MRSA bacteremia, L5-S1 discitis and endocarditis 6 months prior. --Dialysis catheter removed 7/10 and culture tip positive for MRSA. TEE negative for vegetation but but similar very small mobile echodensity seen in RVOT which was better visualized on prior TTE on 03/03/2022.  Repeat blood culture 7/11 NG/F ---ID recommended vancomycin with HD through 07/13/2022. Follow-up with ID as an outpatient.   Gallbladder mass --Status post radical cholecystectomy 7/21, concern for malignancy.   ESRD on dialysis [facility]) with hyperkalemia on admission. Anemia of ESRD --AV fistula being used for HD. --Management per nephrology.   Paroxysmal atrial flutter (HCC) -- was on metoprolol prior to admit; had afib rvr and started on dilt gtt, then to PO Cardizem.  --in SR on metoprolol -- No heparin for 48 hours at least per general surgery. Resume apixaban when able   Acute metabolic encephalopathy -- Resolved.  Etiology unclear.   Physical deconditioning --PT, OT recommended SNF but patient hopeful to return home.   Non-severe (moderate) malnutrition in context of chronic illness --per dietician   Subjective:  Feels better today  Physical Exam: Vitals:   06/26/22 1313 06/26/22 1514 06/26/22 1617 06/26/22 1641  BP: (!) 148/91 128/86 136/89   Pulse: 64 73 75   Resp: '15 16 16   '$ Temp:    97.9 F (36.6 C)   TempSrc:      SpO2: 100% 99% 100%   Weight:    57.8 kg  Height:       Physical Exam Vitals reviewed.  Constitutional:      General: She is not in acute distress.    Appearance: She is not ill-appearing or toxic-appearing.  Cardiovascular:     Rate and Rhythm: Normal rate and regular rhythm.     Heart sounds: No murmur heard. Pulmonary:     Effort: Pulmonary effort is normal. No respiratory distress.     Breath sounds: No wheezing, rhonchi or rales.  Neurological:     Mental Status: She is alert.  Psychiatric:        Mood and Affect: Mood normal.        Behavior: Behavior normal.     Data Reviewed:  Na+ 128 K+ 5.1 AST 135 Hgb down to 8.1 post op  Family Communication: none  Disposition: Status is: Inpatient Remains inpatient appropriate because: POD #1 s/p radical cholecystectomy    Planned Discharge Destination:  TBD    Time spent: 20 minutes  Author: Murray Hodgkins, MD 06/26/2022 5:29 PM  For on call review [email or web address].

## 2022-06-26 NOTE — Progress Notes (Signed)
Contacted NP regarding drop in BP (93/69), high arterial pressure alarm. Patient complaints of abdominal "gassy" pain. NP aware of UF goal being stopped. NP agreed to leaving UF goal for the remainder of treatment. Client is currently resting with eyes closes, breathing unlabored. BP currently 103/77 at approx 1438.

## 2022-06-26 NOTE — Progress Notes (Signed)
Physical Therapy Treatment Patient Details Name: Diamond [name] MRN: [id number] DOB: March 22, 1951 Today's Date: 06/26/2022   History of Present Illness Pt is a 71 y/o female admitted secondary to abdominal pain, vomiting and missed HD. Generalized abdominal pain suspect related to gallbladder mass seen on CT scan. Pt also found to have MRSA and acute metabolic encephalopathy. PMH including but not limited to ESRD, HTN and a-flutter. Planning for TEE on 7/13.    PT Comments    Pt is making slow progress at this point.  Emphasis on warm up ROM exercise to ease pt into mobility, transition to EOB via rolling and up off of R elbow, scooting and sitting balance at EOB, work on sit to stand technique/safety and progression of gait around the bed to the chair.     Recommendations for follow up therapy are one component of a multi-disciplinary discharge planning process, led by the attending physician.  Recommendations may be updated based on patient status, additional functional criteria and insurance authorization.  Follow Up Recommendations  Skilled nursing-short term rehab (<3 hours/day) Can patient physically be transported by private vehicle: No   Assistance Recommended at Discharge Frequent or constant Supervision/Assistance  Patient can return home with the following A lot of help with walking and/or transfers;A lot of help with bathing/dressing/bathroom;Assistance with cooking/housework;Assist for transportation;Help with stairs or ramp for entrance;Direct supervision/assist for medications management;Direct supervision/assist for financial management   Equipment Recommendations  None recommended by PT    Recommendations for Other Services       Precautions / Restrictions Precautions Precautions: Fall     Mobility  Bed Mobility Overal bed mobility: Needs Assistance Bed Mobility: Supine to Sit Rolling: Min assist Sidelying to sit: Mod assist       General bed mobility  comments: increased time and effort, cues for technique and sequencing, assist to roll and transition via R elbow to sitting uprignt    Transfers Overall transfer level: Needs assistance   Transfers: Sit to/from Stand Sit to Stand: Mod assist           General transfer comment: cues for had placement and assist forward and for boost.    Ambulation/Gait Ambulation/Gait assistance: Mod assist, +2 safety/equipment Gait Distance (Feet): 12 Feet Assistive device: Rolling walker (2 wheels) Gait Pattern/deviations: Step-to pattern, Step-through pattern, Decreased step length - right, Decreased step length - left, Decreased stride length   Gait velocity interpretation: <1.31 ft/sec, indicative of household ambulator   General Gait Details: pt needed consistent cuing for posture and proximity to the RW.  Pt unable to maneuver the RW and needed postural, stability and maneuvering of the RW assist.   Stairs             Wheelchair Mobility    Modified Rankin (Stroke Patients Only)       Balance Overall balance assessment: Needs assistance Sitting-balance support: Feet supported, Single extremity supported, Bilateral upper extremity supported Sitting balance-Leahy Scale: Poor Sitting balance - Comments: reliant on UE's or external support   Standing balance support: Bilateral upper extremity supported, During functional activity, Single extremity supported Standing balance-Leahy Scale: Poor Standing balance comment: reliant on external support and RW                            Cognition Arousal/Alertness: Awake/alert Behavior During Therapy: Flat affect Overall Cognitive Status: Impaired/Different from baseline Area of Impairment: Orientation, Memory, Following commands, Safety/judgement, Awareness, Problem solving  Orientation Level: Situation, Time, Place   Memory: Decreased short-term memory Following Commands: Follows one step  commands with increased time Safety/Judgement: Decreased awareness of deficits, Decreased awareness of safety Awareness: Intellectual Problem Solving: Slow processing, Decreased initiation, Difficulty sequencing, Requires verbal cues          Exercises      General Comments General comments (skin integrity, edema, etc.): vss on RA      Pertinent Vitals/Pain Pain Assessment Pain Assessment: Faces Faces Pain Scale: Hurts even more Pain Location: abdominal surgical site. Pain Descriptors / Indicators: Grimacing, Guarding Pain Intervention(s): Limited activity within patient's tolerance, Monitored during session    Home Living                          Prior Function            PT Goals (current goals can now be found in the care plan section) Acute Rehab PT Goals PT Goal Formulation: With patient Time For Goal Achievement: 07/01/22 Potential to Achieve Goals: Fair Progress towards PT goals: Progressing toward goals    Frequency    Min 2X/week      PT Plan Current plan remains appropriate    Co-evaluation              AM-PAC PT "6 Clicks" Mobility   Outcome Measure  Help needed turning from your back to your side while in a flat bed without using bedrails?: A Little Help needed moving from lying on your back to sitting on the side of a flat bed without using bedrails?: A Lot Help needed moving to and from a bed to a chair (including a wheelchair)?: A Lot Help needed standing up from a chair using your arms (e.g., wheelchair or bedside chair)?: A Lot Help needed to walk in hospital room?: A Lot Help needed climbing 3-5 steps with a railing? : Total 6 Click Score: 12    End of Session   Activity Tolerance: Patient tolerated treatment well;Patient limited by pain Patient left: in chair;with call bell/phone within reach Nurse Communication: Mobility status PT Visit Diagnosis: Other abnormalities of gait and mobility (R26.89);Muscle weakness  (generalized) (M62.81);Difficulty in walking, not elsewhere classified (R26.2)     Time: 1033-1100 PT Time Calculation (min) (ACUTE ONLY): 27 min  Charges:  $Gait Training: 8-22 mins $Therapeutic Activity: 8-22 mins                     06/26/2022  Diamond Carne., PT Acute Rehabilitation Services [phone]  (pager) [phone]  (office)   Diamond [name] 06/26/2022, 4:16 PM

## 2022-06-26 NOTE — Anesthesia Postprocedure Evaluation (Signed)
Anesthesia Post Note  Patient: Diamond [name]  Procedure(s) Performed: OPEN CHOLECYSTECTOMY STAGING LAPAROSCOPY OPEN PARTIAL HEPATECTOMY     Patient location during evaluation: PACU Anesthesia Type: General Level of consciousness: awake and alert Pain management: pain level controlled Vital Signs Assessment: post-procedure vital signs reviewed and stable Respiratory status: spontaneous breathing, nonlabored ventilation, respiratory function stable and patient connected to nasal cannula oxygen Cardiovascular status: blood pressure returned to baseline and stable Postop Assessment: no apparent nausea or vomiting Anesthetic complications: no   No notable events documented.  Last Vitals:  Vitals:   06/26/22 0800 06/26/22 1150  BP: (!) 141/89 (!) 147/11  Pulse: 79 75  Resp: 20 14  Temp: 36.6 C 36.7 C  SpO2: 99% 100%    Last Pain:  Vitals:   06/26/22 1150  TempSrc: Oral  PainSc:                  [name]

## 2022-06-26 NOTE — Plan of Care (Signed)
  Problem: Clinical Measurements: Goal: Will remain free from infection Outcome: Progressing Goal: Cardiovascular complication will be avoided Outcome: Progressing   Problem: Coping: Goal: Level of anxiety will decrease Outcome: Progressing   Problem: Pain Managment: Goal: General experience of comfort will improve Outcome: Progressing   Problem: Skin Integrity: Goal: Risk for impaired skin integrity will decrease Outcome: Progressing

## 2022-06-26 NOTE — Plan of Care (Signed)

## 2022-06-27 ENCOUNTER — Encounter (HOSPITAL_COMMUNITY): Payer: Self-pay | Admitting: Surgery

## 2022-06-27 DIAGNOSIS — I4892 Unspecified atrial flutter: Secondary | ICD-10-CM | POA: Diagnosis not present

## 2022-06-27 DIAGNOSIS — N186 End stage renal disease: Secondary | ICD-10-CM | POA: Diagnosis not present

## 2022-06-27 DIAGNOSIS — K828 Other specified diseases of gallbladder: Secondary | ICD-10-CM | POA: Diagnosis not present

## 2022-06-27 DIAGNOSIS — D62 Acute posthemorrhagic anemia: Secondary | ICD-10-CM

## 2022-06-27 DIAGNOSIS — R7881 Bacteremia: Secondary | ICD-10-CM | POA: Diagnosis not present

## 2022-06-27 LAB — COMPREHENSIVE METABOLIC PANEL
ALT: 25 U/L (ref 0–44)
AST: 112 U/L — ABNORMAL HIGH (ref 15–41)
Albumin: 2.3 g/dL — ABNORMAL LOW (ref 3.5–5.0)
Alkaline Phosphatase: 71 U/L (ref 38–126)
Anion gap: 12 (ref 5–15)
BUN: 16 mg/dL (ref 8–23)
CO2: 28 mmol/L (ref 22–32)
Calcium: 8.4 mg/dL — ABNORMAL LOW (ref 8.9–10.3)
Chloride: 92 mmol/L — ABNORMAL LOW (ref 98–111)
Creatinine, Ser: 3.52 mg/dL — ABNORMAL HIGH (ref 0.44–1.00)
GFR, Estimated: 13 mL/min — ABNORMAL LOW (ref >60)
Glucose, Bld: 84 mg/dL (ref 70–99)
Potassium: 3.5 mmol/L (ref 3.5–5.1)
Sodium: 132 mmol/L — ABNORMAL LOW (ref 135–145)
Total Bilirubin: 1 mg/dL (ref 0.3–1.2)
Total Protein: 5.3 g/dL — ABNORMAL LOW (ref 6.5–8.1)

## 2022-06-27 LAB — CBC
HCT: 20.1 % — ABNORMAL LOW (ref 36.0–46.0)
HCT: 24.4 % — ABNORMAL LOW (ref 36.0–46.0)
HCT: 25.4 % — ABNORMAL LOW (ref 36.0–46.0)
Hemoglobin: 6.5 g/dL — CL (ref 12.0–15.0)
Hemoglobin: 8.2 g/dL — ABNORMAL LOW (ref 12.0–15.0)
Hemoglobin: 8.7 g/dL — ABNORMAL LOW (ref 12.0–15.0)
MCH: 32 pg (ref 26.0–34.0)
MCH: 32 pg (ref 26.0–34.0)
MCH: 32.1 pg (ref 26.0–34.0)
MCHC: 32.3 g/dL (ref 30.0–36.0)
MCHC: 33.6 g/dL (ref 30.0–36.0)
MCHC: 34.3 g/dL (ref 30.0–36.0)
MCV: 99 fL (ref 80.0–100.0)
MCV: 95.3 fL (ref 80.0–100.0)
MCV: 93.7 fL (ref 80.0–100.0)
Platelets: 163 10*3/uL (ref 150–400)
Platelets: 163 10*3/uL (ref 150–400)
Platelets: 179 10*3/uL (ref 150–400)
RBC: 2.03 MIL/uL — ABNORMAL LOW (ref 3.87–5.11)
RBC: 2.56 MIL/uL — ABNORMAL LOW (ref 3.87–5.11)
RBC: 2.71 MIL/uL — ABNORMAL LOW (ref 3.87–5.11)
RDW: 19.5 % — ABNORMAL HIGH (ref 11.5–15.5)
RDW: 19.1 % — ABNORMAL HIGH (ref 11.5–15.5)
RDW: 19.3 % — ABNORMAL HIGH (ref 11.5–15.5)
WBC: 7.1 10*3/uL (ref 4.0–10.5)
WBC: 7.4 10*3/uL (ref 4.0–10.5)
WBC: 8 10*3/uL (ref 4.0–10.5)
nRBC: 0 % (ref 0.0–0.2)
nRBC: 0 % (ref 0.0–0.2)
nRBC: 0 % (ref 0.0–0.2)

## 2022-06-27 LAB — PREPARE RBC (CROSSMATCH)

## 2022-06-27 MED ORDER — SODIUM CHLORIDE 0.9% IV SOLUTION: Freq: Once | INTRAVENOUS | Status: DC

## 2022-06-27 NOTE — Progress Notes (Addendum)
2 Days Post-Op  Subjective: A little bit confused this morning.   Objective: Vital signs in last 24 hours: Temp:  [97.8 F (36.6 C)-98.1 F (36.7 C)] 98.1 F (36.7 C) (07/23 0949) Pulse Rate:  [64-86] 80 (07/23 0949) Resp:  [14-19] 16 (07/23 0949) BP: (109-155)/(11-92) 153/92 (07/23 0949) SpO2:  [97 %-100 %] 99 % (07/23 0949) Weight:  [57.2 kg-57.8 kg] 57.5 kg (07/23 0500) Last BM Date : 06/24/22  Intake/Output from previous day: 07/22 0701 - 07/23 0700 In: -  Out: 35 [Drains:35] Intake/Output this shift: Total I/O In: 415 [I.V.:100; Blood:315] Out: -   PE: General: resting comfortably, NAD Resp: normal work of breathing on room air Abdomen: soft, appropriately tender, midline incision is c/d/I without cellulitis or hematoma, JP drain with sanguinous serous output.  Dressing around the tubing changed as it was saturated with serosanguineous fluid.  Does not appear to be any ongoing leakage.    Lab Results:  Recent Labs    06/26/22 0447 06/27/22 0341  WBC 9.9 7.1  HGB 8.1* 6.5*  HCT 24.6* 20.1*  PLT 188 163    BMET Recent Labs    06/26/22 0447 06/27/22 0341  NA 128* 132*  K 5.1 3.5  CL 94* 92*  CO2 21* 28  GLUCOSE 106* 84  BUN 26* 16  CREATININE 5.22* 3.52*  CALCIUM 8.9 8.4*    PT/INR No results for input(s): "LABPROT", "INR" in the last 72 hours. CMP     Component Value Date/Time   NA 132 (L) 06/27/2022 0341   K 3.5 06/27/2022 0341   CL 92 (L) 06/27/2022 0341   CO2 28 06/27/2022 0341   GLUCOSE 84 06/27/2022 0341   BUN 16 06/27/2022 0341   CREATININE 3.52 (H) 06/27/2022 0341   CREATININE 5.76 (H) 12/31/2020 1456   CALCIUM 8.4 (L) 06/27/2022 0341   PROT 5.3 (L) 06/27/2022 0341   ALBUMIN 2.3 (L) 06/27/2022 0341   AST 112 (H) 06/27/2022 0341   ALT 25 06/27/2022 0341   ALKPHOS 71 06/27/2022 0341   BILITOT 1.0 06/27/2022 0341   GFRNONAA 13 (L) 06/27/2022 0341   GFRAA 14 (L) 11/12/2019 0344   Lipase     Component Value Date/Time    LIPASE 52 (H) 06/12/2022 1545       Studies/Results: No results found.  Anti-infectives: Anti-infectives (From admission, onward)    Start     Dose/Rate Route Frequency Ordered Stop   06/25/22 0827  metroNIDAZOLE (FLAGYL) 500 MG/100ML IVPB       Note to Pharmacy: Cameron Sprang M: cabinet override      06/25/22 0827 06/25/22 0940   06/25/22 0827  ceFAZolin (ANCEF) 2-4 GM/100ML-% IVPB       Note to Pharmacy: Cameron Sprang M: cabinet override      06/25/22 0827 06/25/22 0939   06/25/22 0815  ceFAZolin (ANCEF) IVPB 2g/100 mL premix       See Hyperspace for full Linked Orders Report.   2 g 200 mL/hr over 30 Minutes Intravenous On call to O.R. 06/25/22 0813 06/25/22 0930   06/25/22 0815  metroNIDAZOLE (FLAGYL) IVPB 500 mg       See Hyperspace for full Linked Orders Report.   500 mg 100 mL/hr over 60 Minutes Intravenous On call to O.R. 06/25/22 0813 06/25/22 0930   06/15/22 1200  vancomycin (VANCOREADY) IVPB 500 mg/100 mL        500 mg 100 mL/hr over 60 Minutes Intravenous Every T-Th-Sa (Hemodialysis) 06/15/22 1028 07/13/22 2359  06/14/22 1200  vancomycin (VANCOREADY) IVPB 500 mg/100 mL        500 mg 100 mL/hr over 60 Minutes Intravenous Every M-W-F (Hemodialysis) 06/14/22 0814 06/14/22 1714   06/13/22 1130  cefTRIAXone (ROCEPHIN) 2 g in sodium chloride 0.9 % 100 mL IVPB  Status:  Discontinued        2 g 200 mL/hr over 30 Minutes Intravenous Every 24 hours 06/13/22 1118 06/13/22 1249   06/13/22 0800  vancomycin (VANCOREADY) IVPB 1250 mg/250 mL        1,250 mg 166.7 mL/hr over 90 Minutes Intravenous  Once 06/13/22 0757 06/13/22 1223   06/13/22 0757  vancomycin variable dose per unstable renal function (pharmacist dosing)  Status:  Discontinued         Does not apply See admin instructions 06/13/22 0757 06/15/22 1029        Assessment/Plan  71 yo female with gallbladder mass, highly suspicious for adenocarcinoma. POD 2, s/p open radical cholecystectomy with portal LN  dissection by Dr. Zenia Resides 7/21 -Continue holding anticoagulation.  Had a lot of bleeding intra-op and hemoglobin is down to 6.5 this AM.  Transfuse 1U PRBC. - renal diet - PT/OT to see patient for mobilization - IS/pulm toilet  FEN: renal diet VTE: hold for at least 48 hrs post op ID: none needed from surgical standpoint   LOS: 15 days    Clovis Riley 06/27/22 10:39 AM

## 2022-06-27 NOTE — Progress Notes (Signed)
  Progress Note   Patient: Diamond [name] [id number]:[id number] DOB: 1951/04/29 DOA: 06/12/2022     15 DOS: the patient was seen and examined on 06/27/2022   Brief hospital course: 71 year old woman PMH including ESRD presented with nausea, vomiting, diarrhea, admitted for generalized abdominal pain, gallbladder mass, hyperkalemia and acute metabolic encephalopathy. Found to have MRSA bacteremia, seen by ID with ongoing IV antibiotics.  Seen by general surgery and underwent radical cholecystectomy 7/21.  Specimen suspicious for malignancy.  Assessment and Plan: * MRSA bacteremia -- Thought secondary to HD catheter, also has PMH MRSA bacteremia, L5-S1 discitis and endocarditis 6 months prior. --Dialysis catheter removed 7/10 and culture tip positive for MRSA. TEE negative for vegetation but but similar very small mobile echodensity seen in RVOT which was better visualized on prior TTE on 03/03/2022.  Repeat blood culture 7/11 NG/F ---ID recommended vancomycin with HD through 07/13/2022. Follow-up with ID as an outpatient.   Gallbladder mass --Status post radical cholecystectomy 7/21, concern for malignancy, pathology pending.  ABLA  --perioperative --drain management per surgery --1 unit PRBC today for Hgb <7; trend Hgb; post-transfusion Hgb is 8.2. Check this evening and AM.   ESRD on dialysis [facility]) with hyperkalemia on admission. Anemia of ESRD --AV fistula being used for HD. --Management per nephrology.   Paroxysmal atrial flutter (HCC) -- was on metoprolol prior to admit; had afib rvr and started on dilt gtt, then to PO Cardizem. Now back on metoprolol. --in SR on metoprolol -- No heparin for 48 hours at least per general surgery. Resume apixaban when able. Given need for transfusion and bloody drainage around drain, no anticoagulation.   Acute metabolic encephalopathy -- Resolved.  Etiology unclear.   Physical deconditioning --PT, OT recommended SNF but patient hopeful to return home.    Non-severe (moderate) malnutrition in context of chronic illness --per dietician      Subjective:  Confused this AM Didn't get up to chair Per RN having a lot of bloody drainage around drain  Physical Exam: Vitals:   06/27/22 0621 06/27/22 0639 06/27/22 0900 06/27/22 0949  BP: 109/63 (!) 155/92  (!) 153/92  Pulse: 76 74 83 80  Resp: '14 16 18 16  '$ Temp: 98.1 F (36.7 C) 98.1 F (36.7 C)  98.1 F (36.7 C)  TempSrc: Oral Oral  Oral  SpO2: 100% 97% 100% 99%  Weight:      Height:       Physical Exam Vitals reviewed.  Constitutional:      General: She is not in acute distress.    Appearance: She is not ill-appearing or toxic-appearing.  Cardiovascular:     Rate and Rhythm: Normal rate and regular rhythm.     Heart sounds: No murmur heard.    Comments: Telemetry ST Pulmonary:     Effort: Pulmonary effort is normal. No respiratory distress.     Breath sounds: No wheezing, rhonchi or rales.  Neurological:     Mental Status: She is alert.  Psychiatric:        Mood and Affect: Mood normal.        Behavior: Behavior normal.     Data Reviewed:  Drain 35 Hgb down to 6.5 K+ 3.5 AST down to 112  Family Communication: none  Disposition: Status is: Inpatient Remains inpatient appropriate because: POD #1, ABLA  Planned Discharge Destination: Skilled nursing facility vs home    Time spent: 20 minutes  Author: Murray Hodgkins, MD 06/27/2022 6:03 PM  For on call review [email or web address].

## 2022-06-27 NOTE — Progress Notes (Signed)
Pharmacy Antibiotic Follow Up Note  Diamond [name] is a 71 y.o. female admitted on 06/12/2022 with MRSA bacteremia.  Pharmacy has been consulted for vancomycin dosing. Patient got loading dose of vancomycin on 7/9. Patient is s/p TDC removal on 7/10. Patient is on HD schedule every Tuesday, Thursday, Saturday.   Pre-HD vancomycin random within goal range on 7/15. Patient remains afebrile and WBC are WNL.  No change in antibiotic therapy warranted at this time. Patient continues to receive doses following dialysis. Still planning to get a level this week.   Plan: Continue Vancomycin 500 mg qHD-TTS through 07/13/2022 Monitor clinical progress, length of therapy, cultures  Assess pre-HD vanc levels per protocol  Check pre-HD level next week    Height: '5\' 7"'$  (170.2 cm) Weight: 57.5 kg (126 lb 12.2 oz) IBW/kg (Calculated) : 61.6  Temp (24hrs), Avg:98 F (36.7 C), Min:97.8 F (36.6 C), Max:98.1 F (36.7 C)  Recent Labs  Lab 06/22/22 0800 06/23/22 0445 06/23/22 1358 06/24/22 0435 06/25/22 0432 06/26/22 0447 06/27/22 0341  WBC  --    < > 6.2 5.2 5.2 9.9 7.1  CREATININE 7.70*  --  4.86*  --  3.72* 5.22* 3.52*   < > = values in this interval not displayed.     Estimated Creatinine Clearance: 13.1 mL/min (A) (by C-G formula based on SCr of 3.52 mg/dL (H)).    Allergies  Allergen Reactions   Shellfish Allergy     Unknown reaction     Antimicrobials this admission: Ceftriaxone 7/8 x1 Vancomycin 7/8 >> (8/08)  Microbiology results: 7/8 Bcx 2/2: MRSA 7/10 Bcx: ng x 5 days 7/10 Cath tip: 20K col/ml MRSA 7/11 Bcx: ng x 5 days  7/11 MRSA PCR: positive  Vicenta Dunning, PharmD  PGY1 Pharmacy Resident

## 2022-06-27 NOTE — Progress Notes (Addendum)
[facility] Progress Note   Subjective: Seen room. Just finished 1 unit PRBCS but has serosanguinous drainage from around JP drain, dressing saturated for 2nd time this AM. Still a bit confused post op but much better than yesterday.   Objective Vitals:   06/27/22 0500 06/27/22 0621 06/27/22 0639 06/27/22 0949  BP:  109/63 (!) 155/92 (!) 153/92  Pulse:  76 74 80  Resp:  '14 16 16  '$ Temp:  98.1 F (36.7 C) 98.1 F (36.7 C) 98.1 F (36.7 C)  TempSrc:  Oral Oral Oral  SpO2:  100% 97% 99%  Weight: 57.5 kg     Height:       Physical Exam General: Chronically ill appearing female in NAD Heart: S1,S2 No M/R/G. SR on monitor.  Lungs: CTAB Anteriorly.  Abdomen: Midline incision with adhesive closure. JP drain  with bloody drsg RLQ. No BS.  Extremities: No LE edema Dialysis Access: L AVF + T/B.   Additional Objective Labs: Basic Metabolic Panel: Recent Labs  Lab 06/21/22 0643 06/22/22 0800 06/23/22 1358 06/25/22 0432 06/26/22 0447 06/27/22 0341  NA 133* 135 134* 133* 128* 132*  K 4.7 5.0 4.7 3.7 5.1 3.5  CL 97* 97* 95* 93* 94* 92*  CO2 '25 27 26 29 '$ 21* 28  GLUCOSE 89 104* 99 130* 106* 84  BUN 28* 39* 22 14 26* 16  CREATININE 5.40* 7.70* 4.86* 3.72* 5.22* 3.52*  CALCIUM 9.4 9.3 9.8 9.4 8.9 8.4*  PHOS 5.4* 6.5* 4.3  --   --   --    Liver Function Tests: Recent Labs  Lab 06/23/22 1358 06/26/22 0447 06/27/22 0341  AST  --  135* 112*  ALT  --  37 25  ALKPHOS  --  77 71  BILITOT  --  1.2 1.0  PROT  --  5.8* 5.3*  ALBUMIN 2.5* 2.4* 2.3*   No results for input(s): "LIPASE", "AMYLASE" in the last 168 hours. CBC: Recent Labs  Lab 06/23/22 0445 06/23/22 1358 06/24/22 0435 06/25/22 0432 06/26/22 0447 06/27/22 0341  WBC 6.2 6.2 5.2 5.2 9.9 7.1  NEUTROABS 4.3  --  3.6  --   --   --   HGB 10.0* 9.8* 10.1* 10.7* 8.1* 6.5*  HCT 29.7* 29.0* 31.2* 32.5* 24.6* 20.1*  MCV 95.8 96.3 99.0 97.0 97.2 99.0  PLT 213 185 196 209 188 163   Blood Culture     Component Value Date/Time   SDES BLOOD BLOOD RIGHT WRIST 06/15/2022 1021   SPECREQUEST AEROBIC BOTTLE ONLY Blood Culture adequate volume 06/15/2022 1021   CULT  06/15/2022 1021    NO GROWTH 5 DAYS Performed at [facility], [place] [address], [place], [place] [address]    REPTSTATUS 06/20/2022 FINAL 06/15/2022 1021    Cardiac Enzymes: No results for input(s): "CKTOTAL", "CKMB", "CKMBINDEX", "TROPONINI" in the last 168 hours. CBG: No results for input(s): "GLUCAP" in the last 168 hours. Iron Studies: No results for input(s): "IRON", "TIBC", "TRANSFERRIN", "FERRITIN" in the last 72 hours. '@lablastinr3'$ @ Studies/Results: No results found. Medications:  sodium chloride 10 mL/hr at 06/25/22 0908   vancomycin 500 mg (06/26/22 1758)    sodium chloride   Intravenous Once   acetaminophen  650 mg Oral QID   Chlorhexidine Gluconate Cloth  6 each Topical Q0600   darbepoetin (ARANESP) injection - DIALYSIS  150 mcg Intravenous Q Tue-HD   docusate sodium  100 mg Oral BID   feeding supplement  1 Container Oral TID BM   metoprolol  tartrate  50 mg Oral BID   multivitamin  1 tablet Oral QHS   pantoprazole  40 mg Oral BID   sevelamer carbonate  1,600 mg Oral TID WC     Dialysis Orders: [place] TTS 4h  60.3kg  400/600  2/2 bath  LUE AVF (TDC removed here) Hep none - mircera 150 mcg IV q2, last 6/24 - hectorol 4 ug iv tts   Home meds: apixaban 2.5 bid, metoprolol 25 bid, pantoprazole, sevelamer carbonate 2 ac tid   Assessment/Plan: MRSA Bacteremia - H/o MRSA bacteremia/endocarditis. IV Vancomycin per primary. ID recommends IV vancomycin with HD for 4 weeks with end date 07/13/22. Had negative TEE/ TDC removed on 7/10 by VVS. Using AVF now.  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 Per [facility].  AMS -now resolved, probably related to infection, no acute findings on head CT.  ESRD -  HD TTS. Next HD 06/29/2022 Volume  - BPs good, no sig vol overload.  Now under OP EDW.UF as tolerated. Lower EDW on discharge.  Anemia of ESRD/ABLA  - Hgb 10.7 on adm,  down to 6.5 this AM. S/P 1 unit PRBCS. Serosanguinous drainage around JP drain. Rec'd Aranesp 128mg given on 7/18. Follow HGB.  Metabolic bone disease -phosphorus has improved.  Continue binders HTN - on diltiazem, bp's good Hx atrial flutter - getting diltiazem 30 tid here  RVelva HarmanH. Alixandria Friedt NP-C 06/27/2022, 9:58 AM  C[employer]ell [phone]

## 2022-06-27 NOTE — Progress Notes (Signed)
Hgb down to 6.5 this am, likely from recent surgery. Plan to transfuse 1 unit RBC.

## 2022-06-28 DIAGNOSIS — N186 End stage renal disease: Secondary | ICD-10-CM | POA: Diagnosis not present

## 2022-06-28 DIAGNOSIS — R7881 Bacteremia: Secondary | ICD-10-CM | POA: Diagnosis not present

## 2022-06-28 DIAGNOSIS — I4892 Unspecified atrial flutter: Secondary | ICD-10-CM | POA: Diagnosis not present

## 2022-06-28 DIAGNOSIS — K828 Other specified diseases of gallbladder: Secondary | ICD-10-CM | POA: Diagnosis not present

## 2022-06-28 LAB — COMPREHENSIVE METABOLIC PANEL
ALT: 11 U/L (ref 0–44)
AST: 66 U/L — ABNORMAL HIGH (ref 15–41)
Albumin: 2.2 g/dL — ABNORMAL LOW (ref 3.5–5.0)
Alkaline Phosphatase: 82 U/L (ref 38–126)
Anion gap: 13 (ref 5–15)
BUN: 26 mg/dL — ABNORMAL HIGH (ref 8–23)
CO2: 26 mmol/L (ref 22–32)
Calcium: 8.4 mg/dL — ABNORMAL LOW (ref 8.9–10.3)
Chloride: 91 mmol/L — ABNORMAL LOW (ref 98–111)
Creatinine, Ser: 5.21 mg/dL — ABNORMAL HIGH (ref 0.44–1.00)
GFR, Estimated: 8 mL/min — ABNORMAL LOW (ref >60)
Glucose, Bld: 88 mg/dL (ref 70–99)
Potassium: 4.1 mmol/L (ref 3.5–5.1)
Sodium: 130 mmol/L — ABNORMAL LOW (ref 135–145)
Total Bilirubin: 1 mg/dL (ref 0.3–1.2)
Total Protein: 5.3 g/dL — ABNORMAL LOW (ref 6.5–8.1)

## 2022-06-28 LAB — CBC
HCT: 26.5 % — ABNORMAL LOW (ref 36.0–46.0)
Hemoglobin: 8.9 g/dL — ABNORMAL LOW (ref 12.0–15.0)
MCH: 31.9 pg (ref 26.0–34.0)
MCHC: 33.6 g/dL (ref 30.0–36.0)
MCV: 95 fL (ref 80.0–100.0)
Platelets: 164 10*3/uL (ref 150–400)
RBC: 2.79 MIL/uL — ABNORMAL LOW (ref 3.87–5.11)
RDW: 19.1 % — ABNORMAL HIGH (ref 11.5–15.5)
WBC: 7.3 10*3/uL (ref 4.0–10.5)
nRBC: 0 % (ref 0.0–0.2)

## 2022-06-28 LAB — BPAM RBC
Blood Product Expiration Date: 202308232359
Blood Product Expiration Date: 202308232359
ISSUE DATE / TIME: 202307230609
Unit Type and Rh: 5100
Unit Type and Rh: 5100

## 2022-06-28 LAB — TYPE AND SCREEN
ABO/RH(D): O POS
Antibody Screen: NEGATIVE
Unit division: 0
Unit division: 0

## 2022-06-28 MED ORDER — CHLORHEXIDINE GLUCONATE CLOTH 2 % EX PADS
6.0000 | MEDICATED_PAD | Freq: Every day | CUTANEOUS | Status: DC
Start: 2022-06-29 — End: 2022-06-30
  Administered 2022-06-30: 6 via TOPICAL

## 2022-06-28 MED ORDER — HEPARIN SODIUM (PORCINE) 5000 UNIT/ML IJ SOLN
5000.0000 [IU] | Freq: Three times a day (TID) | INTRAMUSCULAR | Status: DC
  Administered 2022-06-28 – 2022-06-30 (×6): 5000 [IU] via SUBCUTANEOUS
  Filled 2022-06-28 (×6): qty 1

## 2022-06-28 NOTE — Progress Notes (Signed)
Physical Therapy Treatment Patient Details Name: Diamond [name] MRN: [id number] DOB: 12-12-1950 Today's Date: 06/28/2022   History of Present Illness Pt is a 71 y/o female admitted secondary to abdominal pain, vomiting and missed HD. Generalized abdominal pain suspect related to gallbladder mass seen on CT scan. Pt also found to have MRSA and acute metabolic encephalopathy. PMH including but not limited to ESRD, HTN and a-flutter.    PT Comments    Pt was seen for mobility on side of bed to pivot with walker to the [facility], and then to chair with direct assistance to step sideways.  Pt was seen for standing balance control, for control of stepping with assist and for postural cuing.  Her seated posture in the chair was adjusted to be comfortable and engage her core, allowing her to increase strength of core and work on ROM to extend her knees.  Nursing was informed pt was up to chair and to manage her with two person transfers back to bed.  Nurse was able to observe PT assist pt to chair.  Follow up for all goals of acute PT POC.   Recommendations for follow up therapy are one component of a multi-disciplinary discharge planning process, led by the attending physician.  Recommendations may be updated based on patient status, additional functional criteria and insurance authorization.  Follow Up Recommendations  Skilled nursing-short term rehab (<3 hours/day) Can patient physically be transported by private vehicle: No   Assistance Recommended at Discharge Frequent or constant Supervision/Assistance  Patient can return home with the following Two people to help with walking and/or transfers;Two people to help with bathing/dressing/bathroom;Assistance with cooking/housework;Assistance with feeding;Direct supervision/assist for medications management;Direct supervision/assist for financial management;Assist for transportation   Equipment Recommendations  None recommended by PT    Recommendations for  Other Services       Precautions / Restrictions Precautions Precautions: Fall Precaution Comments: has recent falls Restrictions Weight Bearing Restrictions: No     Mobility  Bed Mobility Overal bed mobility: Needs Assistance Bed Mobility: Rolling, Supine to Sit Rolling: Min assist   Supine to sit: Mod assist     General bed mobility comments: requires help to both lift trunk and use bed pad to scoot to EOB    Transfers Overall transfer level: Needs assistance Equipment used: Rolling walker (2 wheels) Transfers: Sit to/from Stand, Bed to chair/wheelchair/BSC Sit to Stand: Mod assist   Step pivot transfers: Mod assist       General transfer comment: dense repetitive cues for hand placement and sequence    Ambulation/Gait               General Gait Details: not able to take steps other than for transfers   Stairs             Wheelchair Mobility    Modified Rankin (Stroke Patients Only)       Balance Overall balance assessment: Needs assistance Sitting-balance support: Feet supported, Bilateral upper extremity supported Sitting balance-Leahy Scale: Poor     Standing balance support: Bilateral upper extremity supported, During functional activity, Single extremity supported Standing balance-Leahy Scale: Poor Standing balance comment: cued sequence to stand and move                            Cognition Arousal/Alertness: Awake/alert Behavior During Therapy: Flat affect Overall Cognitive Status: No family/caregiver present to determine baseline cognitive functioning Area of Impairment: Problem solving, Awareness, Safety/judgement, Following commands, Memory,  Attention, Orientation                 Orientation Level: Situation, Time, Place Current Attention Level: Selective Memory: Decreased recall of precautions, Decreased short-term memory Following Commands: Follows one step commands with increased time, Follows one step  commands inconsistently Safety/Judgement: Decreased awareness of safety, Decreased awareness of deficits Awareness: Intellectual Problem Solving: Slow processing, Requires verbal cues, Requires tactile cues General Comments: Pt asked to use BSC, verbalizes need and then is incontinent.        Exercises      General Comments General comments (skin integrity, edema, etc.): Pt was able to assist with standing and pivoting to [facility] and recliner, with cued hand placement and instructions for sequence      Pertinent Vitals/Pain Pain Assessment Pain Assessment: Faces Faces Pain Scale: No hurt    Home Living                          Prior Function            PT Goals (current goals can now be found in the care plan section) Acute Rehab PT Goals Patient Stated Goal: to get stronger before returning home Progress towards PT goals: Progressing toward goals    Frequency    Min 2X/week      PT Plan Current plan remains appropriate    Co-evaluation              AM-PAC PT "6 Clicks" Mobility   Outcome Measure  Help needed turning from your back to your side while in a flat bed without using bedrails?: A Little Help needed moving from lying on your back to sitting on the side of a flat bed without using bedrails?: A Lot Help needed moving to and from a bed to a chair (including a wheelchair)?: A Lot Help needed standing up from a chair using your arms (e.g., wheelchair or bedside chair)?: A Lot Help needed to walk in hospital room?: Total Help needed climbing 3-5 steps with a railing? : Total 6 Click Score: 11    End of Session Equipment Utilized During Treatment: Gait belt Activity Tolerance: Patient tolerated treatment well;Patient limited by pain Patient left: in chair;with call bell/phone within reach Nurse Communication: Mobility status PT Visit Diagnosis: Other abnormalities of gait and mobility (R26.89);Muscle weakness (generalized) (M62.81);Difficulty  in walking, not elsewhere classified (R26.2)     Time: 6063-0160 PT Time Calculation (min) (ACUTE ONLY): 40 min  Charges:  $Therapeutic Activity: 23-37 mins $Neuromuscular Re-education: 8-22 mins  Ramond Dial 06/28/2022, 8:24 PM  Mee Hives, PT PhD Acute Rehab Dept. Number: [place] [facility]

## 2022-06-28 NOTE — Progress Notes (Signed)
Occupational Therapy Treatment Patient Details Name: Diamond [name] MRN: [id number] DOB: [birth year] Today's Date: 06/28/2022   History of present illness Pt is a 71 y/o female admitted secondary to abdominal pain, vomiting and missed HD. Generalized abdominal pain suspect related to gallbladder mass seen on CT scan. Pt also found to have MRSA and acute metabolic encephalopathy. PMH including but not limited to ESRD, HTN and a-flutter.   OT comments  Pt asking for assist to roll and reposition, requires min assist. Max assist to pull up in bed for participation in bed level grooming with set up.  Pt continues to demonstrate impaired cognition, disoriented to time, thought she had just completed lunch when it was breakfast. Pt needing assistance to place lunch order for later. Pt declined EOB and OOB activity despite education in benefits. SNF continues to be the optimal discharge disposition.    Recommendations for follow up therapy are one component of a multi-disciplinary discharge planning process, led by the attending physician.  Recommendations may be updated based on patient status, additional functional criteria and insurance authorization.    Follow Up Recommendations  Skilled nursing-short term rehab (<3 hours/day)    Assistance Recommended at Discharge Frequent or constant Supervision/Assistance  Patient can return home with the following  A lot of help with walking and/or transfers;A lot of help with bathing/dressing/bathroom;Assistance with cooking/housework;Direct supervision/assist for medications management;Direct supervision/assist for financial management;Assist for transportation;Help with stairs or ramp for entrance   Equipment Recommendations  Other (comment) (defer to next venue)    Recommendations for Other Services      Precautions / Restrictions Precautions Precautions: Fall Precaution Comments: reported 2 recent falls       Mobility Bed Mobility Overal bed  mobility: Needs Assistance Bed Mobility: Rolling Rolling: Min assist         General bed mobility comments: rolled to reposition    Transfers                         Balance                                           ADL either performed or assessed with clinical judgement   ADL Overall ADL's : Needs assistance/impaired     Grooming: Oral care;Wash/dry hands;Wash/dry face;Bed level;Set up                                      Extremity/Trunk Assessment              Vision       Perception     Praxis      Cognition Arousal/Alertness: Awake/alert Behavior During Therapy: Flat affect Overall Cognitive Status: Impaired/Different from baseline Area of Impairment: Orientation, Memory, Problem solving, Awareness                 Orientation Level: Disoriented to, Time, Situation   Memory: Decreased short-term memory       Problem Solving: Slow processing, Decreased initiation, Difficulty sequencing General Comments: Pt thought she had just completed eating lunch, breakfast tray on table. Pt unable to place order for lunch.        Exercises      Shoulder Instructions       General Comments  Pertinent Vitals/ Pain       Pain Assessment Pain Assessment: Faces Faces Pain Scale: No hurt  Home Living                                          Prior Functioning/Environment              Frequency  Min 2X/week        Progress Toward Goals  OT Goals(current goals can now be found in the care plan section)  Progress towards OT goals: Not progressing toward goals - comment (pt self limiting, impaired cognition)  Acute Rehab OT Goals OT Goal Formulation: With patient Time For Goal Achievement: 07/04/22 Potential to Achieve Goals: Good  Plan Discharge plan remains appropriate    Co-evaluation                 AM-PAC OT "6 Clicks" Daily Activity     Outcome  Measure   Help from another person eating meals?: None Help from another person taking care of personal grooming?: A Little Help from another person toileting, which includes using toliet, bedpan, or urinal?: A Lot Help from another person bathing (including washing, rinsing, drying)?: A Lot Help from another person to put on and taking off regular upper body clothing?: A Lot Help from another person to put on and taking off regular lower body clothing?: Total 6 Click Score: 14    End of Session    OT Visit Diagnosis: Unsteadiness on feet (R26.81);Other abnormalities of gait and mobility (R26.89);Muscle weakness (generalized) (M62.81);Other symptoms and signs involving cognitive function   Activity Tolerance Other (comment) (pt self limiting)   Patient Left in bed;with call bell/phone within reach;with bed alarm set   Nurse Communication          Time: 9509-3267 OT Time Calculation (min): 13 min  Charges: OT General Charges $OT Visit: 1 Visit OT Treatments $Self Care/Home Management : 8-22 mins  Diamond [name], OTR/L Acute Rehabilitation Services Office: [phone]   Malka So 06/28/2022, 11:49 AM

## 2022-06-28 NOTE — Progress Notes (Signed)
JP site remain dry and intact has not saturated the current dressing, JP emptied 50 cc of drainage. Will cont to monitor. SRP ,RN

## 2022-06-28 NOTE — Progress Notes (Signed)
Pt JP site drenched guaze and overflow on linen and full bulb, reapplied moderate pressure dressing of one split guaze around site, 2 full packs of 4 x 4 and ABD pad with hypafix taping. Moderate pressure to site and will monitor closely. SRP, RN

## 2022-06-28 NOTE — Plan of Care (Signed)
  Problem: Health Behavior/Discharge Planning: Goal: Ability to manage health-related needs will improve Outcome: Progressing   Problem: Clinical Measurements: Goal: Ability to maintain clinical measurements within normal limits will improve Outcome: Progressing Goal: Will remain free from infection Outcome: Progressing Goal: Diagnostic test results will improve Outcome: Progressing Goal: Respiratory complications will improve Outcome: Progressing   

## 2022-06-28 NOTE — TOC Progression Note (Addendum)
Transition of Care [facility]) - Progression Note    Patient Details  Name: Diamond [name] MRN: [id number] Date of Birth: August 29, 1951  Transition of Care [facility]) CM/SW Contact  Cyndi Bender, RN Phone Number: 06/28/2022, 4:34 PM  Clinical Narrative:     [name] to daughter, [name], who wants to take her mother home with home health. Patient has used [facility] in the past. Daughter requesting a wheelchair. This RNCM reached out [employer] who states it will be out of pocket due to having wheelchair 2/23-6/23. Notified [name] to rent the wheelchair it is about  $100 / month. [name] will speak to her mother and the [facility] will call tomorrow. [name] may want a shower chair as well.  Anderson Malta with [facility] will let me know if they can accept.  Expected Discharge Plan: [place] Barriers to Discharge: Continued Medical Work up, Ship broker, SNF Pending bed offer  Expected Discharge Plan and Services Expected Discharge Plan: [place] In-house Referral: Clinical Social Work   Post Acute Care Choice: [place] Living arrangements for the past 2 months: [place] Determinants of Health (SDOH) Interventions    Readmission Risk Interventions    06/17/2022    1:02 PM 01/22/2022   12:19 PM  Readmission Risk Prevention Plan  Transportation Screening Complete Complete  Medication Review (RN Care Manager) Complete Complete  PCP or Specialist appointment within 3-5 days of discharge Complete Complete  HRI or Home Care Consult Complete Complete  SW Recovery Care/Counseling Consult Complete Complete  Palliative Care Screening Not Applicable Not [place] Complete Not Applicable

## 2022-06-28 NOTE — Progress Notes (Signed)
[facility] Progress Note   Subjective:   Patient seen and examined at bedside.  Denies CP, SOB, abdominal pain and n/v/d.  States she is feeling a little better today.   Objective Vitals:   06/28/22 0400 06/28/22 0441 06/28/22 0500 06/28/22 0800  BP: (!) 124/97   (!) 166/100  Pulse: 72  69 70  Resp: '16  15 15  '$ Temp: 98 F (36.7 C)     TempSrc: Oral     SpO2: 98%  97% 100%  Weight:  59.9 kg    Height:       Physical Exam General:chronically ill appearing, elderly female in NAD Heart:RRR, no mrg Lungs:CTAB, nml WOB Abdomen:soft, NTND, midline incision, JP drain from RUQ w/serosanguinous fluid - dressing c/d/i Extremities:no LE edema Dialysis Access: LU AVF +b/t   Filed Weights   06/26/22 1641 06/27/22 0500 06/28/22 0441  Weight: 57.8 kg 57.5 kg 59.9 kg    Intake/Output Summary (Last 24 hours) at 06/28/2022 1237 Last data filed at 06/28/2022 0330 Gross per 24 hour  Intake --  Output 230 ml  Net -230 ml    Additional Objective Labs: Basic Metabolic Panel: Recent Labs  Lab 06/22/22 0800 06/23/22 1358 06/25/22 0432 06/26/22 0447 06/27/22 0341 06/28/22 0229  NA 135 134*   < > 128* 132* 130*  K 5.0 4.7   < > 5.1 3.5 4.1  CL 97* 95*   < > 94* 92* 91*  CO2 27 26   < > 21* 28 26  GLUCOSE 104* 99   < > 106* 84 88  BUN 39* 22   < > 26* 16 26*  CREATININE 7.70* 4.86*   < > 5.22* 3.52* 5.21*  CALCIUM 9.3 9.8   < > 8.9 8.4* 8.4*  PHOS 6.5* 4.3  --   --   --   --    < > = values in this interval not displayed.   Liver Function Tests: Recent Labs  Lab 06/26/22 0447 06/27/22 0341 06/28/22 0229  AST 135* 112* 66*  ALT 37 25 11  ALKPHOS 77 71 82  BILITOT 1.2 1.0 1.0  PROT 5.8* 5.3* 5.3*  ALBUMIN 2.4* 2.3* 2.2*    CBC: Recent Labs  Lab 06/23/22 0445 06/23/22 1358 06/24/22 0435 06/25/22 0432 06/26/22 0447 06/27/22 0341 06/27/22 1259 06/27/22 2048 06/28/22 0229  WBC 6.2   < > 5.2   < > 9.9 7.1 7.4 8.0 7.3  NEUTROABS 4.3  --  3.6  --   --    --   --   --   --   HGB 10.0*   < > 10.1*   < > 8.1* 6.5* 8.2* 8.7* 8.9*  HCT 29.7*   < > 31.2*   < > 24.6* 20.1* 24.4* 25.4* 26.5*  MCV 95.8   < > 99.0   < > 97.2 99.0 95.3 93.7 95.0  PLT 213   < > 196   < > 188 163 163 179 164   < > = values in this interval not displayed.   Medications:  sodium chloride 10 mL/hr at 06/25/22 0908   vancomycin 500 mg (06/26/22 1758)    sodium chloride   Intravenous Once   sodium chloride   Intravenous Once   acetaminophen  650 mg Oral QID   Chlorhexidine Gluconate Cloth  6 each Topical Q0600   darbepoetin (ARANESP) injection - DIALYSIS  150 mcg Intravenous Q Tue-HD   docusate sodium  100 mg Oral BID  feeding supplement  1 Container Oral TID BM   heparin injection (subcutaneous)  5,000 Units Subcutaneous Q8H   metoprolol tartrate  50 mg Oral BID   multivitamin  1 tablet Oral QHS   pantoprazole  40 mg Oral BID   sevelamer carbonate  1,600 mg Oral TID WC    Dialysis Orders: [place] TTS 4h  60.3kg  400/600  2/2 bath  LUE AVF (TDC removed here) Hep none - mircera 150 mcg IV q2, last 6/24 - hectorol 4 ug iv tts   Home meds: apixaban 2.5 bid, metoprolol 25 bid, pantoprazole, sevelamer carbonate 2 ac tid   Assessment/Plan: MRSA Bacteremia - H/o MRSA bacteremia/endocarditis. IV Vancomycin per primary. ID recommends IV vancomycin with HD for 4 weeks with end date 07/13/22. Had negative TEE/ TDC removed on 7/10 by VVS. Using AVF now.  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 Per [facility].  AMS -now resolved, probably related to infection, no acute findings on head CT.  ESRD -  HD TTS. Next HD 06/29/2022 HTN/Volume  - BP elevated this AM, previously in goal.  Currently on metoprolol '50mg'$  BID. Hydralazine '50mg'$  TID on outpatient med list, can add if BP remains elevated. Now under OP EDW, continue UF as tolerated. Will need lower EDW on discharge.  Anemia of ESRD/ABLA  - Hgb 10.7 on adm, then  dropped to 6.5 post surgery. Now improved to 8.9 S/P 1 unit PRBC on 7/23. Serosanguinous drainage around JP drain. Rec'd Aranesp 160mg given on 7/18. Follow HGB.  Metabolic bone disease - last Ca/phos ing goal. Continue binders Hx atrial flutter - diltiazem 30 tid prn ordered  LJen Mow PA-C [place]7/24/2023,12:37 PM  LOS: 16 days

## 2022-06-28 NOTE — Progress Notes (Signed)
Nutrition Follow-up  DOCUMENTATION CODES:   Non-severe (moderate) malnutrition in context of chronic illness  INTERVENTION:  Liberalize diet from a renal to a 2g sodium to provide widest variety of menu options to enhance nutritional adequacy Boost Breeze po TID, each supplement provides 250 kcal and 9 grams of protein  Magic cup TID with meals, each supplement provides 290 kcal and 9 grams of protein Renal MVI with minerals daily Recommend increasing bowel regimen  NUTRITION DIAGNOSIS:   Moderate Malnutrition related to chronic illness (ESRD on HD) as evidenced by mild muscle depletion, moderate muscle depletion, mild fat depletion, moderate fat depletion.  Ongoing  GOAL:   Patient will meet greater than or equal to 90% of their needs  Progressing-addressing needs via meal and nutrition supplements  MONITOR:   PO intake, Supplement acceptance, Labs, Skin  REASON FOR ASSESSMENT:   Consult Assessment of nutrition requirement/status  ASSESSMENT:   71 yo female admitted with MRSA bacteremia from HD catheter, gallbladder mass suspicious for cholangiocarcinoma. PMH includes ESRD on HD, HTN, asthma, gout, dysrhythmia, A flutter with RVR, anemia.  7/21: s/p cholecystectomy, pathology pending, concern for malignancy  Per Nephrology, plans for HD tomorrow. Pt below outpatient EDW. Will need updated EDW.   Pt reports ongoing limited PO intake but improving. She endorses eating ~50% of her meals. Continues to drink Boost at least 1 per day and consuming magic cup with meals.  No documented meal completions on file to review.   Admit wt: 57.2 kg  Current wt: 59.9 kg  Medications: colace, rena-vit, protonix, renvela, IV abx  Labs: sodium 130, potassium 4.1 (wdl), BUN 26, Cr 5.21, AST 66  UOP: 171m x12 hours  JP drain: 2344mx12 hours + 155 x12 hours  I/O's: -[id number] admission  Diet Order:   Diet Order             Diet renal with fluid restriction Fluid  restriction: 1200 mL Fluid; Room service appropriate? No; Fluid consistency: Thin  Diet effective now                   EDUCATION NEEDS:   Not appropriate for education at this time  Skin:  Skin Assessment: Skin Integrity Issues: Skin Integrity Issues:: Stage II Stage II: coccyx  Last BM:  7/21-type 3 medium  Height:   Ht Readings from Last 1 Encounters:  06/25/22 '5\' 7"'$  (1.702 m)    Weight:   Wt Readings from Last 1 Encounters:  06/28/22 59.9 kg    Ideal Body Weight:  61.4 kg  BMI:  Body mass index is 20.68 kg/m.  Estimated Nutritional Needs:   Kcal:  1900-2100  Protein:  85-95 gm  Fluid:  1 L + UOP  AllClayborne DanaDN, LDN Clinical Nutrition

## 2022-06-28 NOTE — Progress Notes (Signed)
  Progress Note   Patient: Diamond [name] DOB: [birth year] DOA: 06/12/2022     16 DOS: the patient was seen and examined on 06/28/2022   Brief hospital course: 71 year old woman PMH including ESRD presented with nausea, vomiting, diarrhea, admitted for generalized abdominal pain, gallbladder mass, hyperkalemia and acute metabolic encephalopathy. Found to have MRSA bacteremia, seen by ID with ongoing IV antibiotics.  Seen by general surgery and underwent radical cholecystectomy 7/21.  Specimen suspicious for malignancy.  Assessment and Plan: * MRSA bacteremia -- Thought secondary to HD catheter, also has PMH MRSA bacteremia, L5-S1 discitis and endocarditis 6 months prior. --Dialysis catheter removed 7/10 and culture tip positive for MRSA. TEE negative for vegetation but but similar very small mobile echodensity seen in RVOT which was better visualized on prior TTE on 03/03/2022.  Repeat blood culture 7/11 NG/F ---ID recommended vancomycin with HD through 07/13/2022. Follow-up with ID as an outpatient.   Gallbladder mass --Status post radical cholecystectomy 7/21, concern for malignancy, pathology pending.   ABLA  --perioperative --drain management per surgery --s/p 1 unit PRBC with now stable Hgb   ESRD on dialysis [facility]) with hyperkalemia on admission. Anemia of ESRD --AV fistula being used for HD. --Management per nephrology.   Paroxysmal atrial flutter (HCC) -- was on metoprolol prior to admit; had afib rvr and started on dilt gtt, then to PO Cardizem. Now back on metoprolol. --in SR on metoprolol -- No heparin for 48 hours at least per general surgery. Resume apixaban when able. Given need for transfusion and bloody drainage around drain, no anticoagulation. Prophylactic heparin today.   Acute metabolic encephalopathy -- Resolved.  Etiology unclear.   Physical deconditioning --PT, OT recommended SNF but patient wants to return home.   Non-severe (moderate) malnutrition  in context of chronic illness --per dietician      Subjective:  Feels better Tolerating diet Hasn't been up to chair or walked this AM  Physical Exam: Vitals:   06/28/22 0441 06/28/22 0500 06/28/22 0800 06/28/22 1315  BP:   (!) 166/100 (!) 154/105  Pulse:  69 70 68  Resp:  '15 15 19  '$ Temp:      TempSrc:      SpO2:  97% 100% 100%  Weight: 59.9 kg     Height:       Physical Exam Vitals reviewed.  Constitutional:      General: She is not in acute distress.    Appearance: She is not ill-appearing or toxic-appearing.  Cardiovascular:     Rate and Rhythm: Normal rate and regular rhythm.     Heart sounds: No murmur heard.    Comments: Telemetry SR Pulmonary:     Effort: Pulmonary effort is normal. No respiratory distress.     Breath sounds: No wheezing, rhonchi or rales.  Neurological:     Mental Status: She is alert.  Psychiatric:        Mood and Affect: Mood normal.        Behavior: Behavior normal.     Data Reviewed:  Na+ 130, stable K+ 4.1 AST down to 66 Hgb stable 8.9  Family Communication: none  Disposition: Status is: Inpatient Remains inpatient appropriate because: post op bleeding  Planned Discharge Destination: Skilled nursing facility recommended, patient refusing    Time spent: 20 minutes  Author: Murray Hodgkins, MD 06/28/2022 5:38 PM  For on call review [email or web address].

## 2022-06-28 NOTE — Progress Notes (Signed)
3 Days Post-Op  Subjective: Hgb stable today. Fluid leaking around drain. Drain is nonbilious.   Objective: Vital signs in last 24 hours: Temp:  [97.8 F (36.6 C)-98.1 F (36.7 C)] 98 F (36.7 C) (07/24 0400) Pulse Rate:  [69-83] 69 (07/24 0500) Resp:  [15-18] 15 (07/24 0500) BP: (124-153)/(92-98) 124/97 (07/24 0400) SpO2:  [97 %-100 %] 97 % (07/24 0500) Weight:  [59.9 kg] 59.9 kg (07/24 0441) Last BM Date : 06/24/22  Intake/Output from previous day: 07/23 0701 - 07/24 0700 In: 415 [I.V.:100; Blood:315] Out: 380 [Urine:150; Drains:230] Intake/Output this shift: No intake/output data recorded.  PE: General: resting comfortably, NAD Resp: normal work of breathing on room air Abdomen: soft, appropriately tender, midline incision is c/d/I without cellulitis or hematoma, JP drain with serosanguinous fluid, nonbilious.    Lab Results:  Recent Labs    06/27/22 2048 06/28/22 0229  WBC 8.0 7.3  HGB 8.7* 8.9*  HCT 25.4* 26.5*  PLT 179 164   BMET Recent Labs    06/27/22 0341 06/28/22 0229  NA 132* 130*  K 3.5 4.1  CL 92* 91*  CO2 28 26  GLUCOSE 84 88  BUN 16 26*  CREATININE 3.52* 5.21*  CALCIUM 8.4* 8.4*   PT/INR No results for input(s): "LABPROT", "INR" in the last 72 hours. CMP     Component Value Date/Time   NA 130 (L) 06/28/2022 0229   K 4.1 06/28/2022 0229   CL 91 (L) 06/28/2022 0229   CO2 26 06/28/2022 0229   GLUCOSE 88 06/28/2022 0229   BUN 26 (H) 06/28/2022 0229   CREATININE 5.21 (H) 06/28/2022 0229   CREATININE 5.76 (H) 12/31/2020 1456   CALCIUM 8.4 (L) 06/28/2022 0229   PROT 5.3 (L) 06/28/2022 0229   ALBUMIN 2.2 (L) 06/28/2022 0229   AST 66 (H) 06/28/2022 0229   ALT 11 06/28/2022 0229   ALKPHOS 82 06/28/2022 0229   BILITOT 1.0 06/28/2022 0229   GFRNONAA 8 (L) 06/28/2022 0229   GFRAA 14 (L) 11/12/2019 0344   Lipase     Component Value Date/Time   LIPASE 52 (H) 06/12/2022 1545       Studies/Results: No results  found.  Anti-infectives: Anti-infectives (From admission, onward)    Start     Dose/Rate Route Frequency Ordered Stop   06/25/22 0827  metroNIDAZOLE (FLAGYL) 500 MG/100ML IVPB       Note to Pharmacy: Cameron Sprang M: cabinet override      06/25/22 0827 06/25/22 0940   06/25/22 0827  ceFAZolin (ANCEF) 2-4 GM/100ML-% IVPB       Note to Pharmacy: Cameron Sprang M: cabinet override      06/25/22 0827 06/25/22 0939   06/25/22 0815  ceFAZolin (ANCEF) IVPB 2g/100 mL premix       See Hyperspace for full Linked Orders Report.   2 g 200 mL/hr over 30 Minutes Intravenous On call to O.R. 06/25/22 0813 06/25/22 0930   06/25/22 0815  metroNIDAZOLE (FLAGYL) IVPB 500 mg       See Hyperspace for full Linked Orders Report.   500 mg 100 mL/hr over 60 Minutes Intravenous On call to O.R. 06/25/22 0813 06/25/22 0930   06/15/22 1200  vancomycin (VANCOREADY) IVPB 500 mg/100 mL        500 mg 100 mL/hr over 60 Minutes Intravenous Every T-Th-Sa (Hemodialysis) 06/15/22 1028 07/13/22 2359   06/14/22 1200  vancomycin (VANCOREADY) IVPB 500 mg/100 mL        500 mg 100 mL/hr over  60 Minutes Intravenous Every M-W-F (Hemodialysis) 06/14/22 0814 06/14/22 1714   06/13/22 1130  cefTRIAXone (ROCEPHIN) 2 g in sodium chloride 0.9 % 100 mL IVPB  Status:  Discontinued        2 g 200 mL/hr over 30 Minutes Intravenous Every 24 hours 06/13/22 1118 06/13/22 1249   06/13/22 0800  vancomycin (VANCOREADY) IVPB 1250 mg/250 mL        1,250 mg 166.7 mL/hr over 90 Minutes Intravenous  Once 06/13/22 0757 06/13/22 1223   06/13/22 0757  vancomycin variable dose per unstable renal function (pharmacist dosing)  Status:  Discontinued         Does not apply See admin instructions 06/13/22 0757 06/15/22 1029        Assessment/Plan  71 yo female with gallbladder mass, highly suspicious for adenocarcinoma. POD 3, s/p open radical cholecystectomy with portal LN dissection on 7/21. - Transfused PRBCs yesterday for hgb 6.5, responded  appropriately and hgb is stable this morning. No signs of ongoing bleeding. - renal diet - PT/OT to see patient for mobilization - IS/pulm toilet  FEN: renal diet VTE: Ok for prophylactic heparin today. Do not resume therapeutic anticoagulation yet.    LOS: 16 days    Dwan Bolt 06/28/22 7:59 AM

## 2022-06-28 NOTE — Progress Notes (Signed)
Pharmacy Antibiotic Follow Up Note  Diamond [name] is a 71 y.o. female admitted on 06/12/2022 with MRSA bacteremia.  Pharmacy has been consulted for vancomycin dosing. Patient got loading dose of vancomycin on 7/9. Patient is s/p TDC removal on 7/10. Patient is on HD schedule every Tuesday, Thursday, Saturday.   Pre-HD vancomycin random within goal range on 7/15. Patient remains afebrile and WBC are WNL.  Patient continues to receive doses following dialysis. Patient did not tolerate HD on 7/22 due to hypotension and increasing stomach pain. Fluids were administered due to the patient's hypotension and she left the session +100 net fluids. Patient received vanc dose despite incomplete dialysis session. Planning to get a vanc level 7/25 pre-HD with the understanding that it may be supratherapeutic due to not tolerating 7/22 HD session. No change in antibiotic therapy warranted at this time.   Plan: Check pre-HD level 7/25 pre-HD (goal 15-25)  Continue Vancomycin 500 mg qHD-TTS through 07/13/2022 Monitor clinical progress, length of therapy, cultures  Assess pre-HD vanc levels per protocol    Height: '5\' 7"'$  (170.2 cm) Weight: 59.9 kg (132 lb 0.9 oz) IBW/kg (Calculated) : 61.6  Temp (24hrs), Avg:97.9 F (36.6 C), Min:97.8 F (36.6 C), Max:98 F (36.7 C)  Recent Labs  Lab 06/23/22 1358 06/24/22 0435 06/25/22 0432 06/26/22 0447 06/27/22 0341 06/27/22 1259 06/27/22 2048 06/28/22 0229  WBC 6.2   < > 5.2 9.9 7.1 7.4 8.0 7.3  CREATININE 4.86*  --  3.72* 5.22* 3.52*  --   --  5.21*   < > = values in this interval not displayed.     Estimated Creatinine Clearance: 9.2 mL/min (A) (by C-G formula based on SCr of 5.21 mg/dL (H)).    Allergies  Allergen Reactions   Shellfish Allergy     Unknown reaction     Antimicrobials this admission: Ceftriaxone 7/8 x1 Vancomycin 7/8 >> (8/08)  Microbiology results: 7/8 Bcx 2/2: MRSA 7/10 Bcx: ng x 5 days 7/10 Cath tip: 20K col/ml MRSA 7/11  Bcx: ng x 5 days  7/11 MRSA PCR: positive  Billey Gosling, PharmD PGY1 Pharmacy Resident  7/24/20232:53 PM

## 2022-06-29 DIAGNOSIS — I4892 Unspecified atrial flutter: Secondary | ICD-10-CM | POA: Diagnosis not present

## 2022-06-29 DIAGNOSIS — B9562 Methicillin resistant Staphylococcus aureus infection as the cause of diseases classified elsewhere: Secondary | ICD-10-CM | POA: Diagnosis not present

## 2022-06-29 DIAGNOSIS — R7881 Bacteremia: Secondary | ICD-10-CM | POA: Diagnosis not present

## 2022-06-29 LAB — CBC
HCT: 28.3 % — ABNORMAL LOW (ref 36.0–46.0)
Hemoglobin: 9.9 g/dL — ABNORMAL LOW (ref 12.0–15.0)
MCH: 32.7 pg (ref 26.0–34.0)
MCHC: 35 g/dL (ref 30.0–36.0)
MCV: 93.4 fL (ref 80.0–100.0)
Platelets: 190 10*3/uL (ref 150–400)
RBC: 3.03 MIL/uL — ABNORMAL LOW (ref 3.87–5.11)
RDW: 19.1 % — ABNORMAL HIGH (ref 11.5–15.5)
WBC: 5.6 10*3/uL (ref 4.0–10.5)
nRBC: 0 % (ref 0.0–0.2)

## 2022-06-29 LAB — SURGICAL PATHOLOGY

## 2022-06-29 LAB — GLUCOSE, CAPILLARY: Glucose-Capillary: 124 mg/dL — ABNORMAL HIGH (ref 70–99)

## 2022-06-29 MED ORDER — PANTOPRAZOLE 2 MG/ML SUSPENSION
40.0000 mg | Freq: Two times a day (BID) | ORAL | Status: DC
Start: 2022-06-29 — End: 2022-06-30
  Administered 2022-06-29 – 2022-06-30 (×2): 40 mg via ORAL
  Filled 2022-06-29 (×2): qty 20

## 2022-06-29 NOTE — Progress Notes (Signed)
Received patient in bed, alert and oriented. Informed consent signed and in chart.  Time tx completed:1230  HD treatment completed. Patient tolerated well. Fistula HD catheter without signs and symptoms of complications. Patient transported back to the room, alert and orient and in no acute distress. Report given to bedside RN.  Total UF removed:2000  Medication given:Aranesp, Vanc  Post HD VS:97.6,75,18,126/95,100%  Post HD weight: 56.4kg

## 2022-06-29 NOTE — TOC Transition Note (Signed)
Transition of Care [facility]) - CM/SW Discharge Note   Patient Details  Name: Diamond [name] MRN: [id number] Date of Birth: Jun 08, 1951  Transition of Care [facility]) CM/SW Contact:  Carles Collet, RN Phone Number: 06/29/2022, 11:49 AM   Clinical Narrative:   [name] w daughter [name] to follow up on DME needs and DC plan.   [name] states that they have WC and 3/1 at home, decline need for any additional assistance with DME. Patient has what she will need for home. Notified by [facility] that they are able to accept for Home Health services.  NEED HH orders for PT OT RN. [name] states that address on file is correct. Patient's sister and her daughters will be staying with her after DC to provide 24/7 care. [name] states that Marchia Bond would be best point of contact to schedule HH with, and I have informed [facility] liaison of this.  No other TOC needs identified at this time.     Final next level of care: [place] Barriers to Discharge: Continued Medical Work up   Patient Goals and CMS Choice Patient states their goals for this hospitalization and ongoing recovery are:: to go home   Choice offered to / list presented to : Adult Children  Discharge Placement                       Discharge Plan and Services In-house Referral: Clinical Social Work   Post Acute Care Choice: [place]          DME Arranged: N/A         HH Arranged: RN, PT, OT [place] Agency: [facility] Date [place]: 06/29/22 Time [place]: [id number] Representative spoke with at [place]: [place] ([place]) Interventions     Readmission Risk Interventions    06/17/2022    1:02 PM 01/22/2022   12:19 PM  Readmission Risk Prevention Plan  Transportation Screening Complete Complete  Medication Review Press photographer) Complete Complete  PCP or Specialist appointment within 3-5 days of discharge Complete Complete  HRI or Home Care Consult  Complete Complete  SW Recovery Care/Counseling Consult Complete Complete  Palliative Care Screening Not Applicable Not [place] Complete Not Applicable

## 2022-06-29 NOTE — Consult Note (Signed)
   [facility] Inpatient Consult   06/29/2022  [name] 12-10-1950 [id number]  [facility] [ACO] Patient: Diamond [name]  Primary Care Provider: [name], MD, [place]   Patient screened for LLOS and 4 hospitalizations in 6 months with noted high risk score for unplanned readmission risk and to assess for potential [place] Management service needs for post hospital transition.    Came by to speak with patient but she was off the floor Plan:  Continue to follow progress and disposition to assess for post hospital care management needs.    For questions contact:   Natividad Brood, RN BSN [facility] Liaison  [phone] business mobile phone Toll free office [phone]  Fax number: [phone] [email or web address]

## 2022-06-29 NOTE — Progress Notes (Signed)
  Progress Note   Patient: Diamond [name] EHM:[id number] DOB: [birth year] DOA: 06/12/2022     17 DOS: the patient was seen and examined on 06/29/2022   Brief hospital course: 71 year old woman PMH including ESRD presented with nausea, vomiting, diarrhea, admitted for generalized abdominal pain, gallbladder mass, hyperkalemia and acute metabolic encephalopathy. Found to have MRSA bacteremia, seen by ID with ongoing IV antibiotics.  Seen by general surgery and underwent radical cholecystectomy 7/21.  Specimen suspicious for malignancy. Condition improving, likely home 7/26.  Assessment and Plan: * MRSA bacteremia -- Thought secondary to HD catheter, also has PMH MRSA bacteremia, L5-S1 discitis and endocarditis 6 months prior. --Dialysis catheter removed 7/10 and culture tip positive for MRSA. TEE negative for vegetation but but similar very small mobile echodensity seen in RVOT which was better visualized on prior TTE on 03/03/2022.  Repeat blood culture 7/11 NG/F ---ID recommended vancomycin with HD through 07/13/2022. Follow-up with ID as an outpatient.   Gallbladder mass --Status post radical cholecystectomy 7/21, concern for malignancy, pathology pending.   ABLA  --perioperative --drain management per surgery --s/p 1 unit PRBC with now stable Hgb; apixaban restarted   ESRD on dialysis [facility]) with hyperkalemia on admission. Anemia of ESRD --AV fistula being used for HD. --Management per nephrology.   Paroxysmal atrial flutter (HCC) -- was on metoprolol prior to admit; had afib rvr and started on dilt gtt, then to PO Cardizem. Now back on metoprolol. --in SR on metoprolol -- back on apixaban per surgery   Acute metabolic encephalopathy -- Resolved.  Etiology unclear.   Physical deconditioning --PT, OT recommended SNF but patient wants to return home.   Non-severe (moderate) malnutrition in context of chronic illness --per dietician      Subjective:  Feels good today  Physical  Exam: Vitals:   06/29/22 1230 06/29/22 1239 06/29/22 1246 06/29/22 1538  BP: (!) 126/95 (!) 149/89 (!) 126/95 119/89  Pulse: 79 78 75 97  Resp: '16  18 17  '$ Temp:  97.6 F (36.4 C) 97.6 F (36.4 C) 98.6 F (37 C)  TempSrc:  Oral  Oral  SpO2: 100% 100% 100% 100%  Weight:      Height:       Physical Exam Vitals reviewed.  Constitutional:      General: She is not in acute distress.    Appearance: She is not ill-appearing or toxic-appearing.  Cardiovascular:     Rate and Rhythm: Normal rate and regular rhythm.     Heart sounds: No murmur heard. Pulmonary:     Effort: Pulmonary effort is normal. No respiratory distress.     Breath sounds: No wheezing, rhonchi or rales.  Neurological:     Mental Status: She is alert.  Psychiatric:        Mood and Affect: Mood normal.        Behavior: Behavior normal.     Data Reviewed:  Hgb stable 9.9  Family Communication: none  Disposition: Status is: Inpatient Remains inpatient appropriate because: s/p surgery  Planned Discharge Destination: Home with [place] (pt refuses SNF)    Time spent: 20 minutes  Author: Murray Hodgkins, MD 06/29/2022 7:02 PM  For on call review [email or web address].

## 2022-06-29 NOTE — Progress Notes (Signed)
[facility] Progress Note   Subjective:   Patient seen and examined at bedside.  Tolerating treatment well so far.  JP drained removed this AM.  Denies CP, abdominal pain, n/v/d, SOB and edema.    Objective Vitals:   06/29/22 0106 06/29/22 0400 06/29/22 0756 06/29/22 0801  BP: (!) 154/93 (!) 152/96 (!) 153/108 (!) 156/103  Pulse: 67 68 77 66  Resp: '14 16 17 14  '$ Temp: 98.2 F (36.8 C) 98.1 F (36.7 C) 98.1 F (36.7 C) 98.1 F (36.7 C)  TempSrc: Oral Oral  Oral  SpO2: 100% 98% 100% 100%  Weight:   58.4 kg   Height:       Physical Exam General:WDWN elderly female in NAD Heart:RRR, no mrg Lungs:CTAB, nml WOB Abdomen:soft, NTND, midline scar, dressing on RLQ Extremities:no LE edema Dialysis Access: LU AVF in use   Filed Weights   06/27/22 0500 06/28/22 0441 06/29/22 0756  Weight: 57.5 kg 59.9 kg 58.4 kg    Intake/Output Summary (Last 24 hours) at 06/29/2022 0818 Last data filed at 06/29/2022 0500 Gross per 24 hour  Intake 120 ml  Output 175 ml  Net -55 ml    Additional Objective Labs: Basic Metabolic Panel: Recent Labs  Lab 06/23/22 1358 06/25/22 0432 06/26/22 0447 06/27/22 0341 06/28/22 0229  NA 134*   < > 128* 132* 130*  K 4.7   < > 5.1 3.5 4.1  CL 95*   < > 94* 92* 91*  CO2 26   < > 21* 28 26  GLUCOSE 99   < > 106* 84 88  BUN 22   < > 26* 16 26*  CREATININE 4.86*   < > 5.22* 3.52* 5.21*  CALCIUM 9.8   < > 8.9 8.4* 8.4*  PHOS 4.3  --   --   --   --    < > = values in this interval not displayed.   Liver Function Tests: Recent Labs  Lab 06/26/22 0447 06/27/22 0341 06/28/22 0229  AST 135* 112* 66*  ALT 37 25 11  ALKPHOS 77 71 82  BILITOT 1.2 1.0 1.0  PROT 5.8* 5.3* 5.3*  ALBUMIN 2.4* 2.3* 2.2*   CBC: Recent Labs  Lab 06/23/22 0445 06/23/22 1358 06/24/22 0435 06/25/22 0432 06/26/22 0447 06/27/22 0341 06/27/22 1259 06/27/22 2048 06/28/22 0229  WBC 6.2   < > 5.2   < > 9.9 7.1 7.4 8.0 7.3  NEUTROABS 4.3  --  3.6  --   --    --   --   --   --   HGB 10.0*   < > 10.1*   < > 8.1* 6.5* 8.2* 8.7* 8.9*  HCT 29.7*   < > 31.2*   < > 24.6* 20.1* 24.4* 25.4* 26.5*  MCV 95.8   < > 99.0   < > 97.2 99.0 95.3 93.7 95.0  PLT 213   < > 196   < > 188 163 163 179 164   < > = values in this interval not displayed.   Medications:  sodium chloride 10 mL/hr at 06/25/22 0908   vancomycin 500 mg (06/26/22 1758)    sodium chloride   Intravenous Once   sodium chloride   Intravenous Once   acetaminophen  650 mg Oral QID   Chlorhexidine Gluconate Cloth  6 each Topical Q0600   Chlorhexidine Gluconate Cloth  6 each Topical Q0600   darbepoetin (ARANESP) injection - DIALYSIS  150 mcg Intravenous Q Tue-HD  docusate sodium  100 mg Oral BID   feeding supplement  1 Container Oral TID BM   heparin injection (subcutaneous)  5,000 Units Subcutaneous Q8H   metoprolol tartrate  50 mg Oral BID   multivitamin  1 tablet Oral QHS   pantoprazole sodium  40 mg Oral BID   sevelamer carbonate  1,600 mg Oral TID WC    Dialysis Orders: [place] TTS 4h  60.3kg  400/600  2/2 bath  LUE AVF (TDC removed here) Hep none - mircera 150 mcg IV q2, last 6/24 - hectorol 4 ug iv tts   Home meds: apixaban 2.5 bid, metoprolol 25 bid, pantoprazole, sevelamer carbonate 2 ac tid   Assessment/Plan: MRSA Bacteremia - H/o MRSA bacteremia/endocarditis. IV Vancomycin per primary. ID recommends IV vancomycin with HD for 4 weeks with end date 07/13/22. Had negative TEE/ TDC removed on 7/10 by VVS. Using AVF now.  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 JP drained removed this AM. Per CCS.  AMS -now resolved, probably related to infection, no acute findings on head CT.  ESRD -  HD TTS. Next HD 06/29/2022 HTN/Volume  - BP elevated this AM, previously in goal.  Currently on metoprolol '50mg'$  BID. Hydralazine '50mg'$  TID on outpatient med list, can add if BP remains elevated post dialysis. Now under OP EDW,  continue UF as tolerated. Will need lower EDW on discharge.  Anemia of ESRD/ABLA  - Hgb 10.7 on adm, then dropped to 6.5 post surgery. Now improved to 8.9 S/P 1 unit PRBC on 7/23. Denies bleeding. Rec'd Aranesp 169mg given on 7/18. Follow HGB.  Metabolic bone disease - last Ca/phos ing goal. Continue binders Hx atrial flutter - diltiazem 30 tid prn ordered Nutrition - Renal diet w/fluid restrictions. Alb 2.2 - give protein supplements.   LJen Mow PA-C [facility] 06/29/2022,8:18 AM  LOS: 17 days

## 2022-06-29 NOTE — Progress Notes (Signed)
    4 Days Post-Op  Subjective: Pain controlled. Patient denies nausea/vomiting and says she ate dinner last night. Passing flatus.   Objective: Vital signs in last 24 hours: Temp:  [98.1 F (36.7 C)-98.4 F (36.9 C)] 98.1 F (36.7 C) (07/25 0400) Pulse Rate:  [67-75] 68 (07/25 0400) Resp:  [14-22] 16 (07/25 0400) BP: (152-166)/(93-105) 152/96 (07/25 0400) SpO2:  [98 %-100 %] 98 % (07/25 0400) Last BM Date : 06/27/22  Intake/Output from previous day: 07/24 0701 - 07/25 0700 In: 120 [P.O.:120] Out: 225 [Drains:155] Intake/Output this shift: No intake/output data recorded.  PE: General: resting comfortably, NAD Resp: normal work of breathing on room air Abdomen: soft, appropriately tender, midline incision is c/d/I without cellulitis or hematoma, JP drain with serosanguinous fluid.    Lab Results:  Recent Labs    06/27/22 2048 06/28/22 0229  WBC 8.0 7.3  HGB 8.7* 8.9*  HCT 25.4* 26.5*  PLT 179 164   BMET Recent Labs    06/27/22 0341 06/28/22 0229  NA 132* 130*  K 3.5 4.1  CL 92* 91*  CO2 28 26  GLUCOSE 84 88  BUN 16 26*  CREATININE 3.52* 5.21*  CALCIUM 8.4* 8.4*   PT/INR No results for input(s): "LABPROT", "INR" in the last 72 hours. CMP     Component Value Date/Time   NA 130 (L) 06/28/2022 0229   K 4.1 06/28/2022 0229   CL 91 (L) 06/28/2022 0229   CO2 26 06/28/2022 0229   GLUCOSE 88 06/28/2022 0229   BUN 26 (H) 06/28/2022 0229   CREATININE 5.21 (H) 06/28/2022 0229   CREATININE 5.76 (H) 12/31/2020 1456   CALCIUM 8.4 (L) 06/28/2022 0229   PROT 5.3 (L) 06/28/2022 0229   ALBUMIN 2.2 (L) 06/28/2022 0229   AST 66 (H) 06/28/2022 0229   ALT 11 06/28/2022 0229   ALKPHOS 82 06/28/2022 0229   BILITOT 1.0 06/28/2022 0229   GFRNONAA 8 (L) 06/28/2022 0229   GFRAA 14 (L) 11/12/2019 0344   Lipase     Component Value Date/Time   LIPASE 52 (H) 06/12/2022 1545       Studies/Results: No results found.    Assessment/Plan  71 yo female with  gallbladder mass, highly suspicious for adenocarcinoma. POD 4, s/p open radical cholecystectomy with portal LN dissection on 7/21. - CBC pending this morning. Patient remains hemodynamically stable. If hemoglobin stable this morning, ok to resume Eliquis. - JP drain removed this morning at bedside - Renal diet - PT following, recommending SNF at discharge - IS/pulm toilet - Surgical pathology pending - VTE: Ok to resume Eliquis today if hgb stable.    LOS: 17 days    Dwan Bolt 06/29/22 7:37 AM

## 2022-06-30 DIAGNOSIS — B9562 Methicillin resistant Staphylococcus aureus infection as the cause of diseases classified elsewhere: Secondary | ICD-10-CM | POA: Diagnosis not present

## 2022-06-30 DIAGNOSIS — R7881 Bacteremia: Secondary | ICD-10-CM | POA: Diagnosis not present

## 2022-06-30 DIAGNOSIS — K828 Other specified diseases of gallbladder: Secondary | ICD-10-CM | POA: Diagnosis not present

## 2022-06-30 DIAGNOSIS — N186 End stage renal disease: Secondary | ICD-10-CM | POA: Diagnosis not present

## 2022-06-30 MED ORDER — DOCUSATE SODIUM 100 MG PO CAPS: 100.0000 mg | ORAL_CAPSULE | Freq: Two times a day (BID) | ORAL | 0 refills | Status: DC

## 2022-06-30 MED ORDER — ACETAMINOPHEN 325 MG PO TABS: 650.0000 mg | ORAL_TABLET | Freq: Four times a day (QID) | ORAL | Status: DC

## 2022-06-30 MED ORDER — APIXABAN 2.5 MG PO TABS
2.5000 mg | ORAL_TABLET | Freq: Two times a day (BID) | ORAL | Status: DC
  Administered 2022-06-30: 2.5 mg via ORAL
  Filled 2022-06-30: qty 1

## 2022-06-30 MED ORDER — VANCOMYCIN HCL 500 MG/100ML IV SOLN: 500.0000 mg | INTRAVENOUS | Status: AC

## 2022-06-30 MED ORDER — CHLORHEXIDINE GLUCONATE CLOTH 2 % EX PADS: 6.0000 | MEDICATED_PAD | Freq: Every day | CUTANEOUS | Status: DC

## 2022-06-30 NOTE — Progress Notes (Signed)
Pt to d/c to home today. Contacted [facility] to advise clinic of pt's d/c today and that pt will resume care tomorrow. Clinic also advised pt will need iv abx with treatments and that [facility] staff to send orders.   Melven Sartorius Renal Navigator [phone]

## 2022-06-30 NOTE — Discharge Summary (Addendum)
Physician Discharge Summary  Diamond [name] HEN:[id number] DOB: March 24, 1951 DOA: 06/12/2022  PCP: [name], MD  Admit date: 06/12/2022 Discharge date: 06/30/2022  Admitted From: Home Disposition:  Home /declined SNF  Recommendations for Outpatient Follow-up:  Follow up with PCP in 1-2 weeks Please obtain BMP/CBC in one week Follow-up with general surgery as an outpatient, appointment scheduled with Dr. Michaelle Birks 07/20/2022 Home Health:YES   Discharge Condition:Stable CODE STATUS: FULL Diet recommendation: Heart Healthy / renal diet  Brief/Interim Summary:  71 year old woman PMH including ESRD presented with nausea, vomiting, diarrhea, admitted for generalized abdominal pain, gallbladder mass, hyperkalemia and acute metabolic encephalopathy. Found to have MRSA bacteremia, seen by ID with ongoing IV antibiotics.  Seen by general surgery and underwent radical cholecystectomy 7/21.  Specimen suspicious for malignancy. Condition improving, home 7/26.  MRSA bacteremia -- Thought secondary to HD catheter, also has PMH MRSA bacteremia, L5-S1 discitis and endocarditis 6 months prior. --Dialysis catheter removed 7/10 and culture tip positive for MRSA. TEE negative for vegetation but but similar very small mobile echodensity seen in RVOT which was better visualized on prior TTE on 03/03/2022.  Repeat blood culture 7/11 NG/F ---ID recommended vancomycin with HD through 07/13/2022. Follow-up with ID as an outpatient.   Gallbladder mass/chronic cholecystitis --Status post radical cholecystectomy 7/21, surgical pathology is benign with significant chronic cholecystitis, no malignancy.   ABLA  --perioperative --drain management per surgery --s/p 1 unit PRBC with now stable Hgb; apixaban restarted   ESRD on dialysis [facility]) with hyperkalemia on admission. Anemia of ESRD --AV fistula being used for HD. --Management per nephrology.   Paroxysmal atrial flutter (HCC) -- was on metoprolol and  verapamil prior to admission, she did develop A-fib with RVR for which she did require diltiazem drip, heart rate currently controlled only on metoprolol, she is back to normal sinus rhythm . -Resume on Eliquis    Acute metabolic encephalopathy -- Resolved.  Etiology unclear.   Physical deconditioning --PT, OT recommended SNF but patient wants to return home.   Non-severe (moderate) malnutrition in context of chronic illness --per dietician   Pressure ulcer Pressure Injury Coccyx Stage 2 -  Partial thickness loss of dermis presenting as a shallow open injury with a red, pink wound bed without slough. (Active)     Location: Coccyx  Location Orientation:   Staging: Stage 2 -  Partial thickness loss of dermis presenting as a shallow open injury with a red, pink wound bed without slough.  Wound Description (Comments):   Present on Admission: Yes     Discharge Diagnoses:  Principal Problem:   MRSA bacteremia Active Problems:   Gallbladder mass   Hyperkalemia   Hypertension   ESRD on dialysis ([place])   Paroxysmal atrial flutter (HCC)   Acute metabolic encephalopathy   Hyponatremia   Hypokalemia   Lower back pain   Pressure injury of skin   Physical deconditioning   Malnutrition of moderate degree    Discharge Instructions  Discharge Instructions     Diet - low sodium heart healthy   Complete by: As directed    Discharge instructions   Complete by: As directed    [facility] DISCHARGE INSTRUCTIONS  Activity  No heavy lifting greater than 15 pounds for 6 weeks after surgery.  Ok to shower, but do not bathe or submerge incisions underwater for at least 3 weeks.  Do not drive while taking narcotic pain medication.  Wound Care  Your incision is covered with skin glue called Dermabond. This  will peel off on its own over time.  You may shower and allow warm soapy water to run over your incision. Gently pat dry.  Do not submerge your incision  underwater.  Monitor your incision for any new redness, tenderness, or drainage.  When to Call us:  Fever greater than 100.5  New redness, drainage, or swelling at incision site  Severe pain, nausea, or vomiting  Jaundice (yellowing of the whites of the eyes or skin)  Follow-up You have an appointment scheduled with Dr. Zenia Resides on July 20, 2022 at 9:20am - Please arrive by 9am to complete paperwork prior to your appointment. This will be at the [facility] Surgery office at [address], [place], [place], [place].   For questions or concerns, please call the [facility] Surgery office at [phone].   Increase activity slowly   Complete by: As directed    No wound care   Complete by: As directed       Allergies as of 06/30/2022       Reactions   Shellfish Allergy    Unknown reaction         Medication List     STOP taking these medications    allopurinol 100 MG tablet Commonly known as: ZYLOPRIM   hydrALAZINE 50 MG tablet Commonly known as: APRESOLINE   verapamil 120 MG tablet Commonly known as: CALAN       TAKE these medications    acetaminophen 325 MG tablet Commonly known as: TYLENOL Take 2 tablets (650 mg total) by mouth 4 (four) times daily. What changed:  medication strength how much to take when to take this reasons to take this   albuterol 108 (90 Base) MCG/ACT inhaler Commonly known as: VENTOLIN HFA Inhale 2 puffs into the lungs every 4 (four) hours as needed for wheezing or shortness of breath.   apixaban 2.5 MG Tabs tablet Commonly known as: ELIQUIS Take 1 tablet (2.5 mg total) by mouth 2 (two) times daily.   cyclobenzaprine 5 MG tablet Commonly known as: FLEXERIL Take 2.5 mg by mouth daily as needed for muscle spasms.   docusate sodium 100 MG capsule Commonly known as: COLACE Take 1 capsule (100 mg total) by mouth 2 (two) times daily.   fluticasone-salmeterol 250-50 MCG/ACT Aepb Commonly known as:  ADVAIR Inhale 1 puff into the lungs 2 (two) times daily.   lidocaine 4 % Apply 1 patch topically daily as needed (pain).   metoprolol tartrate 50 MG tablet Commonly known as: LOPRESSOR Take 1 tablet (50 mg total) by mouth 2 (two) times daily.   ondansetron 4 MG disintegrating tablet Commonly known as: ZOFRAN-ODT Take 1 tablet (4 mg total) by mouth every 8 (eight) hours as needed for nausea or vomiting.   oxyCODONE 5 MG immediate release tablet Commonly known as: Roxicodone Take 1 tablet (5 mg total) by mouth every 6 (six) hours as needed for severe pain.   pantoprazole 40 MG tablet Commonly known as: PROTONIX Take 1 tablet (40 mg total) by mouth 2 (two) times daily.   sevelamer carbonate 800 MG tablet Commonly known as: RENVELA Take 2 tablets (1,600 mg total) by mouth 3 (three) times daily with meals.   vancomycin 500 MG/100ML IVPB Commonly known as: VANCOREADY Inject 100 mLs (500 mg total) into the vein Every Tuesday,Thursday,and Saturday with dialysis for 12 days. Start taking on: July 01, 2022        Follow-up Information     Dwan Bolt, MD Follow  up on 07/20/2022.   Specialty: General Surgery Contact information: [address] [place] [address] [phone]                Allergies  Allergen Reactions   Shellfish Allergy     Unknown reaction     Consultations: Cardiology Infectious disease Nephrology Vascular surgery Interventional radiology General surgery     Procedures/Studies: ECHO TEE  Result Date: 06/25/2022    TRANSESOPHOGEAL ECHO REPORT   Patient Name:   GEONNA LOCKYER Date of Exam: 06/17/2022 Medical Rec #:  [id number]      Height:       67.0 in Accession #:    [id number]     Weight:       133.4 lb Date of Birth:  16-Apr-1951     BSA:          1.702 m Patient Age:    71 years       BP:           109/60 mmHg Patient Gender: F              HR:           130 bpm. Exam Location:  Inpatient Procedure: Transesophageal  Echo, 3D Echo, Color Doppler and Cardiac Doppler Indications:     Bacteremia  History:         Patient has prior history of Echocardiogram examinations, most                  recent 03/03/2022. Arrythmias:Atrial Flutter; Risk                  Factors:Hypertension.  Sonographer:     Bernadene Person RDCS Referring Phys:  [place] Diagnosing Phys: Buford Dresser MD PROCEDURE: After discussion of the risks and benefits of a TEE, an informed consent was obtained from the patient. The transesophogeal probe was passed without difficulty through the esophogus of the patient. Sedation performed by different physician. The patient was monitored while under deep sedation. Anesthestetic sedation was provided intravenously by Anesthesiology: 163.'69mg'$  of Propofol, '80mg'$  of Lidocaine. Image quality was good. The patient's vital signs; including heart rate, blood pressure, and oxygen saturation; remained stable throughout the procedure. The patient developed no complications during the procedure. IMPRESSIONS  1. Left ventricular ejection fraction, by estimation, is 60 to 65%. The left ventricle has normal function.  2. Right ventricular systolic function is normal. The right ventricular size is normal.  3. No left atrial/left atrial appendage thrombus was detected.  4. The mitral valve is normal in structure. Trivial mitral valve regurgitation. No evidence of mitral stenosis.  5. The aortic valve is tricuspid. Aortic valve regurgitation is not visualized. No aortic stenosis is present.  6. There is mild (Grade II) plaque.  7. Similar very small mobile echodensity seen in RVOT (see image 31), better visualized on prior TTE. Not seen on other views or 3D images. Conclusion(s)/Recommendation(s): No evidence of endocarditis on any of the valves. No mass seen in right atrium. There is a very small echodensity in the RVOT, seen on prior chest wall imaging. This is not new and is less prominent on current imaging compared  to prior. FINDINGS  Left Ventricle: Left ventricular ejection fraction, by estimation, is 60 to 65%. The left ventricle has normal function. The left ventricular internal cavity size was normal in size. Right Ventricle: The right ventricular size is normal. No increase in right ventricular wall thickness. Right  ventricular systolic function is normal. Left Atrium: Left atrial size was normal in size. No left atrial/left atrial appendage thrombus was detected. Right Atrium: Right atrial size was normal in size. Pericardium: Trivial pericardial effusion is present. Mitral Valve: The mitral valve is normal in structure. Trivial mitral valve regurgitation. No evidence of mitral valve stenosis. There is no evidence of mitral valve vegetation. Tricuspid Valve: The tricuspid valve is normal in structure. Tricuspid valve regurgitation is mild . No evidence of tricuspid stenosis. There is no evidence of tricuspid valve vegetation. Aortic Valve: The aortic valve is tricuspid. Aortic valve regurgitation is not visualized. No aortic stenosis is present. There is no evidence of aortic valve vegetation. Pulmonic Valve: The pulmonic valve was grossly normal. Pulmonic valve regurgitation is trivial. No evidence of pulmonic stenosis. There is no evidence of pulmonic valve vegetation. Aorta: The aortic root and ascending aorta are structurally normal, with no evidence of dilitation. There is mild (Grade II) plaque. Pulmonary Artery: Similar very small mobile echodensity seen in RVOT (see image 31), better visualized on prior TTE. Not seen on other views or 3D images. IAS/Shunts: No atrial level shunt detected by color flow Doppler. Buford Dresser MD Electronically signed by Buford Dresser MD Signature Date/Time: 06/25/2022/12:59:36 PM    Final    CT CHEST WO CONTRAST  Result Date: 06/15/2022 CLINICAL DATA:  Gallbladder/biliary cancer. EXAM: CT CHEST WITHOUT CONTRAST TECHNIQUE: Multidetector CT imaging of the chest  was performed following the standard protocol without IV contrast. RADIATION DOSE REDUCTION: This exam was performed according to the departmental dose-optimization program which includes automated exposure control, adjustment of the mA and/or kV according to patient size and/or use of iterative reconstruction technique. COMPARISON:  CT chest without contrast 12/22/2021. CT chest with contrast 10/15/2010 FINDINGS: Cardiovascular: Heart size is normal. Coronary artery calcifications are present. Atherosclerotic changes are again noted in the aorta. No significant interval change is present. The ascending thoracic aorta is stable at 40 mm. Descending thoracic aorta demonstrates mural calcifications without aneurysmal dilation. Mediastinum/Nodes: No significant mediastinal, hilar, or axillary adenopathy is present. Detail is somewhat limited without IV contrast. Lungs/Pleura: The elongated nodular density in the right lower lobe is stable measuring 8 x 7 x 14 mm. Punctate hyperdensity suggest peripheral calcification. Measurements are unchanged. A 6 x 5 mm left lower lobe pulmonary nodule on image 81 of series 4 is stable. The nodules appear similar on axial images. No new nodules are present. No significant pleural disease is present. Scarring scratched at upper pole scarring in the left upper lobe is stable. Upper Abdomen: Diffuse gallbladder wall thickening is noted. The gallbladder is partially imaged. Upper abdomen is otherwise unremarkable. Musculoskeletal: No chest wall mass or suspicious bone lesions identified. IMPRESSION: 1. Stable elongated nodular density in the right lower lobe. This is most likely benign. This is now stable at 6 months. CT at 12-18 months (from today's scan) is considered optional for low-risk patients, but is recommended for high-risk patients. This recommendation follows the consensus statement: Guidelines for Management of Incidental Pulmonary Nodules Detected on CT Images: From the  Fleischner Society 2017; Radiology 2017; 284:228-243. 2. Stable 6 mm left lower lobe pulmonary nodule. No follow-up recommended for this nodule. 3. Coronary artery disease. 4. Stable diameter of the ascending thoracic aorta at 40 mm. 5. Diffuse gallbladder wall thickening is partially imaged. Refer to prior CT of the abdomen and pelvis. 6. Aortic Atherosclerosis (ICD10-I70.0). Electronically Signed   By: San Morelle M.D.   On: 06/15/2022 13:07  CT ABDOMEN PELVIS W CONTRAST  Result Date: 06/12/2022 CLINICAL DATA:  Abdominal pain and diarrhea for 2 days EXAM: CT ABDOMEN AND PELVIS WITH CONTRAST TECHNIQUE: Multidetector CT imaging of the abdomen and pelvis was performed using the standard protocol following bolus administration of intravenous contrast. RADIATION DOSE REDUCTION: This exam was performed according to the departmental dose-optimization program which includes automated exposure control, adjustment of the mA and/or kV according to patient size and/or use of iterative reconstruction technique. CONTRAST:  115m OMNIPAQUE IOHEXOL 300 MG/ML  SOLN COMPARISON:  01/16/2022 CT, MRI from 01/17/2022 FINDINGS: Lower chest: No acute abnormality. Hepatobiliary: Liver is well visualized without focal mass lesion. Gallbladder is well distended. Known cholelithiasis is seen and stable. Wall thickening is noted similar to that seen as well. An enhancing mass is again seen in the region of the fundus measuring up to 2.8 cm again suspicious for gallbladder carcinoma. No biliary ductal dilatation is seen. Pancreas: Unremarkable. No pancreatic ductal dilatation or surrounding inflammatory changes. Spleen: Calcified granulomas are noted.  No mass lesion is seen. Adrenals/Urinary Tract: Adrenal glands are within normal limits. Kidneys demonstrate a normal enhancement pattern. Scattered cysts are again identified and stable. No follow-up is recommended. Cortical calcifications as well as nonobstructing stones are noted  on the right. No obstructive changes are noted. The bladder is decompressed with wall thickening identified Stomach/Bowel: The appendix is well visualized and within normal limits. No obstructive or inflammatory changes of the colon are seen. Small bowel shows no obstructive changes. Stomach is fluid-filled. Vascular/Lymphatic: Aortic atherosclerosis. No enlarged abdominal or pelvic lymph nodes. Reproductive: Uterus and bilateral adnexa are unremarkable. Other: No abdominal wall hernia or abnormality. No abdominopelvic ascites. Musculoskeletal: Degenerative changes of lumbar spine are noted worst at L4-5 with endplate sclerosis and disc space widening likely related to prior discitis. The overall appearance is stable from the prior study. IMPRESSION: Cholelithiasis with wall thickening in the gallbladder as well as a gallbladder fundal mass suspicious for carcinoma. These changes are stable from prior MR and CT. Nonobstructing renal calculi on the right. Chronic changes at L4-L5 likely related to prior discitis. Electronically Signed   By: MInez CatalinaM.D.   On: 06/12/2022 22:51   CT Head Wo Contrast  Result Date: 06/12/2022 CLINICAL DATA:  Altered mental status EXAM: CT HEAD WITHOUT CONTRAST TECHNIQUE: Contiguous axial images were obtained from the base of the skull through the vertex without intravenous contrast. RADIATION DOSE REDUCTION: This exam was performed according to the departmental dose-optimization program which includes automated exposure control, adjustment of the mA and/or kV according to patient size and/or use of iterative reconstruction technique. COMPARISON:  None Available. FINDINGS: Brain: No evidence of acute infarction, hemorrhage, hydrocephalus, extra-axial collection or mass lesion/mass effect. Chronic atrophic and ischemic changes are noted. Vascular: No hyperdense vessel or unexpected calcification. Skull: Normal. Negative for fracture or focal lesion. Sinuses/Orbits: No acute  finding. Other: None. IMPRESSION: Chronic atrophic and ischemic changes without acute abnormality. Electronically Signed   By: MInez CatalinaM.D.   On: 06/12/2022 22:45   DG Chest 1 View  Result Date: 06/12/2022 CLINICAL DATA:  Questionable sepsis EXAM: CHEST  1 VIEW COMPARISON:  Radiograph 03/09/2022 FINDINGS: Right neck catheter tip overlies the superior cavoatrial junction. Unchanged cardiomediastinal silhouette. There is no focal airspace consolidation. There is no pleural effusion. There is no pneumothorax. There is no acute osseous abnormality. Skin fold overlies the left lower chest. IMPRESSION: No evidence of acute cardiopulmonary disease. Electronically Signed   By: JMaurine Simmering  M.D.   On: 06/12/2022 16:25      Subjective: No significant events overnight, she denies any complaints today  Discharge Exam: Vitals:   06/30/22 0805 06/30/22 1211  BP: (!) 148/95 117/73  Pulse: 82 74  Resp: 15 17  Temp: 98.5 F (36.9 C) 98.5 F (36.9 C)  SpO2:     Vitals:   06/30/22 0033 06/30/22 0428 06/30/22 0805 06/30/22 1211  BP: (!) 153/90 112/70 (!) 148/95 117/73  Pulse: 78 76 82 74  Resp: '17 15 15 17  '$ Temp: 97.9 F (36.6 C) 98.5 F (36.9 C) 98.5 F (36.9 C) 98.5 F (36.9 C)  TempSrc: Oral Oral Oral Oral  SpO2: 97% 100%    Weight:      Height:        General: Pt is alert, awake, not in acute distress Cardiovascular: RRR, S1/S2 +, no rubs, no gallops Respiratory: CTA bilaterally, no wheezing, no rhonchi Abdominal: Soft, NT, ND, bowel sounds + Extremities: no edema, no cyanosis    The results of significant diagnostics from this hospitalization (including imaging, microbiology, ancillary and laboratory) are listed below for reference.     Microbiology: No results found for this or any previous visit (from the past 240 hour(s)).   Labs: BNP (last 3 results) Recent Labs    01/20/22 0458 01/21/22 0228 06/12/22 1545  BNP 943.3* 760.8* 127.5*   Basic Metabolic Panel: Recent  Labs  Lab 06/23/22 1358 06/25/22 0432 06/26/22 0447 06/27/22 0341 06/28/22 0229  NA 134* 133* 128* 132* 130*  K 4.7 3.7 5.1 3.5 4.1  CL 95* 93* 94* 92* 91*  CO2 26 29 21* 28 26  GLUCOSE 99 130* 106* 84 88  BUN 22 14 26* 16 26*  CREATININE 4.86* 3.72* 5.22* 3.52* 5.21*  CALCIUM 9.8 9.4 8.9 8.4* 8.4*  PHOS 4.3  --   --   --   --    Liver Function Tests: Recent Labs  Lab 06/23/22 1358 06/26/22 0447 06/27/22 0341 06/28/22 0229  AST  --  135* 112* 66*  ALT  --  37 25 11  ALKPHOS  --  77 71 82  BILITOT  --  1.2 1.0 1.0  PROT  --  5.8* 5.3* 5.3*  ALBUMIN 2.5* 2.4* 2.3* 2.2*   No results for input(s): "LIPASE", "AMYLASE" in the last 168 hours. No results for input(s): "AMMONIA" in the last 168 hours. CBC: Recent Labs  Lab 06/24/22 0435 06/25/22 0432 06/27/22 0341 06/27/22 1259 06/27/22 2048 06/28/22 0229 06/29/22 1436  WBC 5.2   < > 7.1 7.4 8.0 7.3 5.6  NEUTROABS 3.6  --   --   --   --   --   --   HGB 10.1*   < > 6.5* 8.2* 8.7* 8.9* 9.9*  HCT 31.2*   < > 20.1* 24.4* 25.4* 26.5* 28.3*  MCV 99.0   < > 99.0 95.3 93.7 95.0 93.4  PLT 196   < > 163 163 179 164 190   < > = values in this interval not displayed.   Cardiac Enzymes: No results for input(s): "CKTOTAL", "CKMB", "CKMBINDEX", "TROPONINI" in the last 168 hours. BNP: Invalid input(s): "POCBNP" CBG: Recent Labs  Lab 06/29/22 1537  GLUCAP 124*   D-Dimer No results for input(s): "DDIMER" in the last 72 hours. Hgb A1c No results for input(s): "HGBA1C" in the last 72 hours. Lipid Profile No results for input(s): "CHOL", "HDL", "LDLCALC", "TRIG", "CHOLHDL", "LDLDIRECT" in the last 72 hours. Thyroid function studies  No results for input(s): "TSH", "T4TOTAL", "T3FREE", "THYROIDAB" in the last 72 hours.  Invalid input(s): "FREET3" Anemia work up No results for input(s): "VITAMINB12", "FOLATE", "FERRITIN", "TIBC", "IRON", "RETICCTPCT" in the last 72 hours. Urinalysis    Component Value Date/Time   COLORURINE  STRAW (A) 06/12/2022 2040   APPEARANCEUR CLEAR 06/12/2022 2040   LABSPEC 1.006 06/12/2022 2040   PHURINE 7.0 06/12/2022 2040   GLUCOSEU NEGATIVE 06/12/2022 2040   HGBUR SMALL (A) 06/12/2022 2040   BILIRUBINUR NEGATIVE 06/12/2022 2040   KETONESUR NEGATIVE 06/12/2022 2040   PROTEINUR 30 (A) 06/12/2022 2040   UROBILINOGEN 0.2 09/27/2021 1329   NITRITE NEGATIVE 06/12/2022 2040   LEUKOCYTESUR NEGATIVE 06/12/2022 2040   Sepsis Labs Recent Labs  Lab 06/27/22 1259 06/27/22 2048 06/28/22 0229 06/29/22 1436  WBC 7.4 8.0 7.3 5.6   Microbiology No results found for this or any previous visit (from the past 240 hour(s)).   Time coordinating discharge: Over 30 minutes  SIGNED:   Phillips Climes, MD  [facility] 06/30/2022, 12:35 PM Pager   If 7PM-7AM, please contact night-coverage [email or web address]

## 2022-06-30 NOTE — Progress Notes (Signed)
    5 Days Post-Op  Subjective: No acute complaints. Eating, having bowel function. Pain controlled. Hgb stable.   Objective: Vital signs in last 24 hours: Temp:  [97.6 F (36.4 C)-98.6 F (37 C)] 98.5 F (36.9 C) (07/26 0805) Pulse Rate:  [67-97] 82 (07/26 0805) Resp:  [13-23] 15 (07/26 0805) BP: (110-168)/(65-101) 148/95 (07/26 0805) SpO2:  [97 %-100 %] 100 % (07/26 0428) Last BM Date : 06/29/22  Intake/Output from previous day: 07/25 0701 - 07/26 0700 In: -  Out: 2  Intake/Output this shift: No intake/output data recorded.  PE: General: resting comfortably, NAD Resp: normal work of breathing on room air Abdomen: soft, appropriately tender, midline incision is c/d/I without cellulitis or hematoma, prior drain site clean and dry.    Lab Results:  Recent Labs    06/28/22 0229 06/29/22 1436  WBC 7.3 5.6  HGB 8.9* 9.9*  HCT 26.5* 28.3*  PLT 164 190   BMET Recent Labs    06/28/22 0229  NA 130*  K 4.1  CL 91*  CO2 26  GLUCOSE 88  BUN 26*  CREATININE 5.21*  CALCIUM 8.4*   PT/INR No results for input(s): "LABPROT", "INR" in the last 72 hours. CMP     Component Value Date/Time   NA 130 (L) 06/28/2022 0229   K 4.1 06/28/2022 0229   CL 91 (L) 06/28/2022 0229   CO2 26 06/28/2022 0229   GLUCOSE 88 06/28/2022 0229   BUN 26 (H) 06/28/2022 0229   CREATININE 5.21 (H) 06/28/2022 0229   CREATININE 5.76 (H) 12/31/2020 1456   CALCIUM 8.4 (L) 06/28/2022 0229   PROT 5.3 (L) 06/28/2022 0229   ALBUMIN 2.2 (L) 06/28/2022 0229   AST 66 (H) 06/28/2022 0229   ALT 11 06/28/2022 0229   ALKPHOS 82 06/28/2022 0229   BILITOT 1.0 06/28/2022 0229   GFRNONAA 8 (L) 06/28/2022 0229   GFRAA 14 (L) 11/12/2019 0344   Lipase     Component Value Date/Time   LIPASE 52 (H) 06/12/2022 1545       Studies/Results: No results found.    Assessment/Plan  71 yo female with gallbladder mass. POD5, s/p open radical cholecystectomy with portal LN dissection on 7/21. -  Surgical pathology is benign with significant chronic cholecystitis but no malignancy. I shared this with the patient this morning. - Renal diet - PT following, recommending SNF at discharge - IS/pulm toilet - VTE: Continue eliquis - Dispo: Patient is safe for discharge from a surgical standpoint. We have arranged a postop appointment for her on 07/20/22.    LOS: 18 days    Dwan Bolt 06/30/22 8:51 AM

## 2022-06-30 NOTE — Progress Notes (Signed)
Occupational Therapy Treatment Patient Details Name: Diamond [name] MRN: [id number] DOB: 1951/01/03 Today's Date: 06/30/2022   History of present illness Pt is a 71 y/o female admitted secondary to abdominal pain, vomiting and missed HD. Generalized abdominal pain suspect related to gallbladder mass seen on CT scan. Pt also found to have MRSA and acute metabolic encephalopathy. PMH including but not limited to ESRD, HTN and a-flutter.   OT comments  Pt still needing max assist for sit to stand, stand pivot transfers with use of the RW and for toileting tasks.  Increased abdominal/back pain with increased internal distraction secondary to the pain.  Mod demonstrational cueing for initiation of sit to stand, hand placement during transfers, and proper technique to reach back for stand to sit.  Feel she will continue to benefit from acute care OT.  Will continue to follow until discharge.    Recommendations for follow up therapy are one component of a multi-disciplinary discharge planning process, led by the attending physician.  Recommendations may be updated based on patient status, additional functional criteria and insurance authorization.    Follow Up Recommendations  Skilled nursing-short term rehab (<3 hours/day)    Assistance Recommended at Discharge Frequent or constant Supervision/Assistance  Patient can return home with the following  A lot of help with walking and/or transfers;A lot of help with bathing/dressing/bathroom;Assistance with cooking/housework;Assist for transportation;Direct supervision/assist for medications management   Equipment Recommendations  None recommended by OT       Precautions / Restrictions Precautions Precautions: Fall       Mobility Bed Mobility Overal bed mobility: Needs Assistance Bed Mobility: Supine to Sit     Supine to sit: Min assist Sit to supine: Mod assist   General bed mobility comments: Assist for lifting trunk up to sitting to  midline.  She also needed assist for lifting the LEs in to the bed when laying down.    Transfers Overall transfer level: Needs assistance Equipment used: Rolling walker (2 wheels) Transfers: Sit to/from Stand, Bed to chair/wheelchair/BSC Sit to Stand: Max assist     Step pivot transfers: Max assist     General transfer comment: Pt with increased difficulty transitioning hands from the surface pushing up from to the walker.  Needed multiple attempts to stand to manage clothing and complete toilet hygiene.     Balance Overall balance assessment: Needs assistance Sitting-balance support: Feet supported, Bilateral upper extremity supported Sitting balance-Leahy Scale: Poor Sitting balance - Comments: right lean with LOB when attempting to donn gripper socks   Standing balance support: Bilateral upper extremity supported, During functional activity, Reliant on assistive device for balance Standing balance-Leahy Scale: Poor                             ADL either performed or assessed with clinical judgement   ADL Overall ADL's : Needs assistance/impaired                     Lower Body Dressing: Maximal assistance;Sit to/from stand   Toilet Transfer: Maximal assistance;BSC/3in1;Stand-pivot;Rolling walker (2 wheels)   Toileting- Clothing Manipulation and Hygiene: Maximal assistance;Sit to/from stand       Functional mobility during ADLs: Maximal assistance;Rolling walker (2 wheels) (max assist stand pivot to the 3:1 with use of the RW)                 Cognition Arousal/Alertness: Awake/alert Behavior During Therapy: Flat affect Overall Cognitive Status:  Impaired/Different from baseline Area of Impairment: Problem solving, Awareness, Safety/judgement, Memory, Attention, Orientation, Following commands                   Current Attention Level: Sustained Memory: Decreased short-term memory Following Commands: Follows multi-step commands with  increased time, Follows one step commands with increased time Safety/Judgement: Decreased awareness of safety Awareness: Intellectual Problem Solving: Slow processing, Requires verbal cues, Requires tactile cues General Comments: Pt needed mod demonstrational cueing for hand placement with sit to stand as well as initiation of transfer secondary to decreased selective attention.                   Pertinent Vitals/ Pain       Pain Assessment Pain Assessment: 0-10 Faces Pain Scale: Hurts even more Pain Location: back and lower abdomen Pain Descriptors / Indicators: Grimacing, Discomfort Pain Intervention(s): Monitored during session, Patient requesting pain meds-RN notified         Frequency  Min 2X/week        Progress Toward Goals  OT Goals(current goals can now be found in the care plan section)  Progress towards OT goals: Progressing toward goals  Acute Rehab OT Goals Patient Stated Goal: Pt requested to go to the bathroom OT Goal Formulation: With patient Time For Goal Achievement: 07/04/22 Potential to Achieve Goals: Good  Plan Discharge plan remains appropriate       AM-PAC OT "6 Clicks" Daily Activity     Outcome Measure   Help from another person eating meals?: None Help from another person taking care of personal grooming?: A Little Help from another person toileting, which includes using toliet, bedpan, or urinal?: A Lot Help from another person bathing (including washing, rinsing, drying)?: A Lot Help from another person to put on and taking off regular upper body clothing?: A Little Help from another person to put on and taking off regular lower body clothing?: A Lot 6 Click Score: 16    End of Session Equipment Utilized During Treatment: Gait belt;Rolling walker (2 wheels)  OT Visit Diagnosis: Unsteadiness on feet (R26.81);Other abnormalities of gait and mobility (R26.89);Muscle weakness (generalized) (M62.81);Other symptoms and signs involving  cognitive function;Pain Pain - Right/Left:  (anterior abdomen)   Activity Tolerance Patient limited by pain   Patient Left in bed;with call bell/phone within reach;with bed alarm set   Nurse Communication Mobility status        Time: 2841-3244 OT Time Calculation (min): 45 min  Charges: OT General Charges $OT Visit: 1 Visit OT Treatments $Self Care/Home Management : 38-52 mins  Lucy Woolever OTR/L 06/30/2022, 3:06 PM

## 2022-06-30 NOTE — Discharge Instructions (Signed)
[facility] DISCHARGE INSTRUCTIONS  Activity No heavy lifting greater than 15 pounds for 6 weeks after surgery. Ok to shower, but do not bathe or submerge incisions underwater for at least 3 weeks. Do not drive while taking narcotic pain medication.  Wound Care Your incision is covered with skin glue called Dermabond. This will peel off on its own over time. You may shower and allow warm soapy water to run over your incision. Gently pat dry. Do not submerge your incision underwater. Monitor your incision for any new redness, tenderness, or drainage.  When to Call us: Fever greater than 100.5 New redness, drainage, or swelling at incision site Severe pain, nausea, or vomiting Jaundice (yellowing of the whites of the eyes or skin)  Follow-up You have an appointment scheduled with Dr. Zenia Resides on July 20, 2022 at 9:20am - Please arrive by 9am to complete paperwork prior to your appointment. This will be at the [facility] Surgery office at [address], [place], [place], [place].   For questions or concerns, please call the [facility] Surgery office at [phone].

## 2022-06-30 NOTE — Progress Notes (Signed)
[facility] Progress Note   Subjective:   Patient seen and examined at bedside.  Reports she gets to go home today. Feeling much better.  Denies abdominal pain, CP, SOB, n/v/d and bleeding.   Objective Vitals:   06/29/22 1956 06/30/22 0033 06/30/22 0428 06/30/22 0805  BP: 110/73 (!) 153/90 112/70 (!) 148/95  Pulse: 90 78 76 82  Resp: '19 17 15 15  '$ Temp: 98.3 F (36.8 C) 97.9 F (36.6 C) 98.5 F (36.9 C) 98.5 F (36.9 C)  TempSrc: Oral Oral Oral Oral  SpO2: 98% 97% 100%   Weight:      Height:       Physical Exam General: well appearing, elderly female in NAD Heart:RRR, no mrg Lungs:CTAB, nml WOB Abdomen:soft, NTND Extremities:no LE edema Dialysis Access: LU AVF +b/t   Filed Weights   06/27/22 0500 06/28/22 0441 06/29/22 0756  Weight: 57.5 kg 59.9 kg 58.4 kg    Intake/Output Summary (Last 24 hours) at 06/30/2022 1119 Last data filed at 06/29/2022 1246 Gross per 24 hour  Intake --  Output 2 ml  Net -2 ml    Additional Objective Labs: Basic Metabolic Panel: Recent Labs  Lab 06/23/22 1358 06/25/22 0432 06/26/22 0447 06/27/22 0341 06/28/22 0229  NA 134*   < > 128* 132* 130*  K 4.7   < > 5.1 3.5 4.1  CL 95*   < > 94* 92* 91*  CO2 26   < > 21* 28 26  GLUCOSE 99   < > 106* 84 88  BUN 22   < > 26* 16 26*  CREATININE 4.86*   < > 5.22* 3.52* 5.21*  CALCIUM 9.8   < > 8.9 8.4* 8.4*  PHOS 4.3  --   --   --   --    < > = values in this interval not displayed.   Liver Function Tests: Recent Labs  Lab 06/26/22 0447 06/27/22 0341 06/28/22 0229  AST 135* 112* 66*  ALT 37 25 11  ALKPHOS 77 71 82  BILITOT 1.2 1.0 1.0  PROT 5.8* 5.3* 5.3*  ALBUMIN 2.4* 2.3* 2.2*   CBC: Recent Labs  Lab 06/24/22 0435 06/25/22 0432 06/27/22 0341 06/27/22 1259 06/27/22 2048 06/28/22 0229 06/29/22 1436  WBC 5.2   < > 7.1 7.4 8.0 7.3 5.6  NEUTROABS 3.6  --   --   --   --   --   --   HGB 10.1*   < > 6.5* 8.2* 8.7* 8.9* 9.9*  HCT 31.2*   < > 20.1* 24.4* 25.4*  26.5* 28.3*  MCV 99.0   < > 99.0 95.3 93.7 95.0 93.4  PLT 196   < > 163 163 179 164 190   < > = values in this interval not displayed.   Medications:  sodium chloride 10 mL/hr at 06/25/22 0908   vancomycin 500 mg (06/29/22 1154)    sodium chloride   Intravenous Once   sodium chloride   Intravenous Once   acetaminophen  650 mg Oral QID   apixaban  2.5 mg Oral BID   Chlorhexidine Gluconate Cloth  6 each Topical Q0600   Chlorhexidine Gluconate Cloth  6 each Topical Q0600   darbepoetin (ARANESP) injection - DIALYSIS  150 mcg Intravenous Q Tue-HD   docusate sodium  100 mg Oral BID   feeding supplement  1 Container Oral TID BM   metoprolol tartrate  50 mg Oral BID   multivitamin  1 tablet Oral QHS  pantoprazole sodium  40 mg Oral BID   sevelamer carbonate  1,600 mg Oral TID WC    Dialysis Orders: [facility] 4h  60.3kg  400/600  2/2 bath  LUE AVF (TDC removed here) Hep none - mircera 150 mcg IV q2, last 6/24 - hectorol 4 ug iv tts   Home meds: apixaban 2.5 bid, metoprolol 25 bid, pantoprazole, sevelamer carbonate 2 ac tid   Assessment/Plan: MRSA Bacteremia - H/o MRSA bacteremia/endocarditis. IV Vancomycin per primary. ID recommends IV vancomycin with HD for 4 weeks with end date 07/13/22. Had negative TEE/ TDC removed on 7/10 by [facility]. Using AVF now.  Gallbladder mass - S/P open radical cholecystectomy with portal LN dissection 06/25/2022. JP drained removed this yesterday.  Pathology returned as benign per surgery.   AMS -now resolved, probably related to infection, no acute findings on head CT.  ESRD -  HD TTS. Next HD 07/01/2022 HTN/Volume  - BP variable. Currently on metoprolol '50mg'$  BID. Hydralazine '50mg'$  TID on outpatient med list, can add if BP remains elevated post dialysis. Now under OP EDW, continue UF as tolerated. Will need lower EDW on discharge.  Anemia of ESRD/ABLA  - Hgb 10.7 on adm, then dropped to 6.5 post surgery. Now improved to 9.9 S/P 1 unit PRBC on 7/23. Denies  bleeding. Rec'd Aranesp 148mg given on 7/18. Follow HGB.  Metabolic bone disease - last Ca/phos ing goal. Continue binders Hx atrial flutter - diltiazem 30 tid prn ordered Nutrition - Renal diet w/fluid restrictions. Alb 2.2 - give protein supplements.  Dispo - ok for d/c from renal standpoint.  LJen Mow PA-C [facility] 06/30/2022,11:19 AM  LOS: 18 days

## 2022-07-01 ENCOUNTER — Telehealth: Payer: Self-pay | Admitting: Nurse Practitioner

## 2022-07-01 NOTE — Telephone Encounter (Signed)
Transition of care contact from inpatient facility  Date of discharge: 06/30/2022 Date of contact: 07/01/2022 Method: Phone Spoke to: Patient  Patient contacted to discuss transition of care from recent inpatient hospitalization. Patient was admitted to [facility] from 07/08-07/26/2023 with discharge diagnosis of MRSA Bacteremia   Medication changes were reviewed. Patient is asking for pain medication. Told to call PCP.   Patient will follow up with his/her outpatient HD unit on: 07/01/2022

## 2022-07-03 DIAGNOSIS — N186 End stage renal disease: Secondary | ICD-10-CM | POA: Diagnosis not present

## 2022-07-03 DIAGNOSIS — Z992 Dependence on renal dialysis: Secondary | ICD-10-CM | POA: Diagnosis not present

## 2022-07-03 DIAGNOSIS — N2581 Secondary hyperparathyroidism of renal origin: Secondary | ICD-10-CM | POA: Diagnosis not present

## 2022-07-07 ENCOUNTER — Emergency Department (HOSPITAL_COMMUNITY): Payer: Medicare HMO

## 2022-07-07 ENCOUNTER — Emergency Department (HOSPITAL_COMMUNITY)
Admission: EM | Admit: 2022-07-07 | Discharge: 2022-07-07 | Disposition: A | Discharge status: Home / Self Care | Payer: Medicare HMO | Attending: Emergency Medicine | Admitting: Emergency Medicine

## 2022-07-07 ENCOUNTER — Encounter (HOSPITAL_COMMUNITY): Payer: Self-pay

## 2022-07-07 DIAGNOSIS — Z992 Dependence on renal dialysis: Secondary | ICD-10-CM | POA: Diagnosis not present

## 2022-07-07 DIAGNOSIS — I10 Essential (primary) hypertension: Secondary | ICD-10-CM | POA: Diagnosis not present

## 2022-07-07 DIAGNOSIS — Z7901 Long term (current) use of anticoagulants: Secondary | ICD-10-CM | POA: Insufficient documentation

## 2022-07-07 DIAGNOSIS — I120 Hypertensive chronic kidney disease with stage 5 chronic kidney disease or end stage renal disease: Secondary | ICD-10-CM | POA: Insufficient documentation

## 2022-07-07 DIAGNOSIS — Z79899 Other long term (current) drug therapy: Secondary | ICD-10-CM | POA: Insufficient documentation

## 2022-07-07 DIAGNOSIS — G8918 Other acute postprocedural pain: Secondary | ICD-10-CM | POA: Insufficient documentation

## 2022-07-07 DIAGNOSIS — R0602 Shortness of breath: Secondary | ICD-10-CM | POA: Diagnosis not present

## 2022-07-07 DIAGNOSIS — R1084 Generalized abdominal pain: Secondary | ICD-10-CM | POA: Diagnosis not present

## 2022-07-07 DIAGNOSIS — N186 End stage renal disease: Secondary | ICD-10-CM | POA: Insufficient documentation

## 2022-07-07 DIAGNOSIS — N200 Calculus of kidney: Secondary | ICD-10-CM | POA: Diagnosis not present

## 2022-07-07 DIAGNOSIS — N281 Cyst of kidney, acquired: Secondary | ICD-10-CM | POA: Diagnosis not present

## 2022-07-07 DIAGNOSIS — R1013 Epigastric pain: Secondary | ICD-10-CM | POA: Insufficient documentation

## 2022-07-07 DIAGNOSIS — R079 Chest pain, unspecified: Secondary | ICD-10-CM | POA: Diagnosis not present

## 2022-07-07 LAB — COMPREHENSIVE METABOLIC PANEL
ALT: 7 U/L (ref 0–44)
AST: 17 U/L (ref 15–41)
Albumin: 2.5 g/dL — ABNORMAL LOW (ref 3.5–5.0)
Alkaline Phosphatase: 72 U/L (ref 38–126)
Anion gap: 14 (ref 5–15)
BUN: 54 mg/dL — ABNORMAL HIGH (ref 8–23)
CO2: 25 mmol/L (ref 22–32)
Calcium: 9.1 mg/dL (ref 8.9–10.3)
Chloride: 97 mmol/L — ABNORMAL LOW (ref 98–111)
Creatinine, Ser: 8.85 mg/dL — ABNORMAL HIGH (ref 0.44–1.00)
GFR, Estimated: 4 mL/min — ABNORMAL LOW (ref >60)
Glucose, Bld: 101 mg/dL — ABNORMAL HIGH (ref 70–99)
Potassium: 3.7 mmol/L (ref 3.5–5.1)
Sodium: 136 mmol/L (ref 135–145)
Total Bilirubin: 0.8 mg/dL (ref 0.3–1.2)
Total Protein: 6 g/dL — ABNORMAL LOW (ref 6.5–8.1)

## 2022-07-07 LAB — URINALYSIS, ROUTINE W REFLEX MICROSCOPIC
Bilirubin Urine: NEGATIVE
Glucose, UA: NEGATIVE mg/dL
Hgb urine dipstick: NEGATIVE
Ketones, ur: NEGATIVE mg/dL
Leukocytes,Ua: NEGATIVE
Nitrite: NEGATIVE
Protein, ur: 100 mg/dL — AB
Specific Gravity, Urine: 1.011 (ref 1.005–1.030)
pH: 9 — ABNORMAL HIGH (ref 5.0–8.0)

## 2022-07-07 LAB — I-STAT CHEM 8, ED
BUN: 48 mg/dL — ABNORMAL HIGH (ref 8–23)
Calcium, Ion: 1.14 mmol/L — ABNORMAL LOW (ref 1.15–1.40)
Chloride: 96 mmol/L — ABNORMAL LOW (ref 98–111)
Creatinine, Ser: 9.8 mg/dL — ABNORMAL HIGH (ref 0.44–1.00)
Glucose, Bld: 94 mg/dL (ref 70–99)
HCT: 32 % — ABNORMAL LOW (ref 36.0–46.0)
Hemoglobin: 10.9 g/dL — ABNORMAL LOW (ref 12.0–15.0)
Potassium: 3.7 mmol/L (ref 3.5–5.1)
Sodium: 135 mmol/L (ref 135–145)
TCO2: 26 mmol/L (ref 22–32)

## 2022-07-07 LAB — LIPASE, BLOOD: Lipase: 41 U/L (ref 11–51)

## 2022-07-07 LAB — CBC
HCT: 27.1 % — ABNORMAL LOW (ref 36.0–46.0)
Hemoglobin: 9.2 g/dL — ABNORMAL LOW (ref 12.0–15.0)
MCH: 32.7 pg (ref 26.0–34.0)
MCHC: 33.9 g/dL (ref 30.0–36.0)
MCV: 96.4 fL (ref 80.0–100.0)
Platelets: 263 10*3/uL (ref 150–400)
RBC: 2.81 MIL/uL — ABNORMAL LOW (ref 3.87–5.11)
RDW: 19.6 % — ABNORMAL HIGH (ref 11.5–15.5)
WBC: 6 10*3/uL (ref 4.0–10.5)
nRBC: 0 % (ref 0.0–0.2)

## 2022-07-07 LAB — TROPONIN I (HIGH SENSITIVITY)
Troponin I (High Sensitivity): 24 ng/L — ABNORMAL HIGH (ref <18)
Troponin I (High Sensitivity): 22 ng/L — ABNORMAL HIGH (ref <18)

## 2022-07-07 MED ORDER — OXYCODONE HCL 5 MG PO TABS
5.0000 mg | ORAL_TABLET | ORAL | Status: AC
  Administered 2022-07-07: 5 mg via ORAL
  Filled 2022-07-07: qty 1

## 2022-07-07 MED ORDER — ONDANSETRON 4 MG PO TBDP
8.0000 mg | ORAL_TABLET | Freq: Once | ORAL | Status: AC
  Administered 2022-07-07: 8 mg via ORAL
  Filled 2022-07-07: qty 2

## 2022-07-07 MED ORDER — HYDRALAZINE HCL 10 MG PO TABS
10.0000 mg | ORAL_TABLET | ORAL | Status: AC
  Administered 2022-07-07: 10 mg via ORAL
  Filled 2022-07-07: qty 1

## 2022-07-07 MED ORDER — ONDANSETRON HCL 4 MG/2ML IJ SOLN: 4.0000 mg | Freq: Once | INTRAMUSCULAR | Status: DC

## 2022-07-07 MED ORDER — OXYCODONE HCL 5 MG PO TABS: 5.0000 mg | ORAL_TABLET | Freq: Two times a day (BID) | ORAL | 0 refills | Status: DC | PRN

## 2022-07-07 MED ORDER — IOHEXOL 300 MG/ML  SOLN
80.0000 mL | Freq: Once | INTRAMUSCULAR | Status: AC | PRN
  Administered 2022-07-07: 80 mL via INTRAVENOUS

## 2022-07-07 NOTE — Discharge Instructions (Addendum)
Today you were seen in the emergency department for your abdominal pain.    In the emergency department you had a CT scan that did not show any concerning findings.    At home, please take Tylenol for pain and oxycodone twice daily for any breakthrough pain that may have.  This is important to not take this medication more than twice a day with your kidney function.  Do not take this medication before driving or operating heavy machinery.  Follow-up with your surgeon in 2-3 days regarding your visit.    Return immediately to the emergency department if you experience any of the following: Worsening pain, vomiting, or any other concerning symptoms.    Thank you for visiting our Emergency Department. It was a pleasure taking care of you today.

## 2022-07-07 NOTE — ED Provider Notes (Signed)
[facility] EMERGENCY DEPARTMENT Provider Note   CSN: [id number] Arrival date & time: 07/07/22  1150     History  Chief Complaint  Patient presents with   Abdominal Pain   Post-op Problem    Diamond [name] is a 71 y.o. female.   Abdominal Pain  71 year old female presents emergency department with complaints of abdominal pain.  Patient states that symptoms have been persistent for the past 2 weeks after her cholecystectomy performed on 06/25/2022.  She states the pain now is no worse than it was upon discharge but its persistence brought her in.  She notes some feelings of nausea with 2 episodes of diarrhea over the past 2 days.  She denies vomiting, hematochezia, melena.  Denies shortness of breath, chest pain, fever, chills, night sweats, urinary symptoms, vaginal symptoms.  She is a known CKD patient on hemodialysis who gets dialysis Saturday, Tuesday, Thursday but missed yesterday's dialysis appointment.   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 Authorizing Provider  acetaminophen (TYLENOL) 325 MG tablet Take 2 tablets (650 mg total) by mouth 4 (four) times daily. 06/30/22   Elgergawy, Silver Huguenin, MD  albuterol (VENTOLIN HFA) 108 (90 Base) MCG/ACT inhaler Inhale 2 puffs into the lungs every 4 (four) hours as needed for wheezing or shortness of breath. 10/08/19   [provider]  apixaban (ELIQUIS) 2.5 MG TABS tablet Take 1 tablet (2.5 mg total) by mouth 2 (two) times daily. 02/06/22   Ghimire, Henreitta Leber, MD  cyclobenzaprine (FLEXERIL) 5 MG tablet Take 2.5 mg by mouth daily as needed for muscle spasms.    [provider]  docusate sodium (COLACE) 100 MG capsule Take 1 capsule (100 mg total) by mouth 2 (two) times daily. 06/30/22   Elgergawy, Silver Huguenin, MD   fluticasone-salmeterol (ADVAIR) 250-50 MCG/ACT AEPB Inhale 1 puff into the lungs 2 (two) times daily. 02/19/22   [provider]  Lidocaine 4 % PTCH Apply 1 patch topically daily as needed (pain).    Raymondo Band, MD  metoprolol tartrate (LOPRESSOR) 50 MG tablet Take 1 tablet (50 mg total) by mouth 2 (two) times daily. 12/30/21   Ghimire, Henreitta Leber, MD  ondansetron (ZOFRAN-ODT) 4 MG disintegrating tablet Take 1 tablet (4 mg total) by mouth every 8 (eight) hours as needed for nausea or vomiting. 06/08/22   White, Leitha Schuller, NP  oxyCODONE (ROXICODONE) 5 MG immediate release tablet Take 1 tablet (5 mg total) by mouth every 6 (six) hours as needed for severe pain. 03/08/22   Rhyne, Hulen Shouts, PA-C  pantoprazole (PROTONIX) 40 MG tablet Take 1 tablet (40 mg total) by mouth 2 (two) times daily. 01/22/22   Thurnell Lose, MD  sevelamer carbonate (RENVELA) 800 MG tablet Take 2 tablets (1,600 mg total) by mouth 3 (three) times daily with meals. Patient not taking: Reported on 06/13/2022 12/30/21   Jonetta Osgood, MD  vancomycin (VANCOREADY) 500 MG/100ML IVPB Inject 100 mLs (500 mg total) into the vein Every Tuesday,Thursday,and Saturday with dialysis for 12 days. 07/01/22 07/13/22  Elgergawy, Silver Huguenin, MD      Allergies    Shellfish allergy and Other    Review of Systems   Review of Systems  Gastrointestinal:  Positive for abdominal pain.    Physical Exam Updated Vital Signs BP (!) 184/139 (BP Location: Right  Arm)   Pulse 72   Temp 98.3 F (36.8 C) (Oral)   Resp 18   Ht '5\' 7"'$  (1.702 m)   Wt 61.2 kg   SpO2 100%   BMI 21.14 kg/m  Physical Exam Vitals and nursing note reviewed.  Constitutional:      General: She is not in acute distress.    Appearance: She is well-developed. She is not ill-appearing.  HENT:     Head: Normocephalic and atraumatic.  Eyes:     Conjunctiva/sclera: Conjunctivae normal.  Cardiovascular:     Rate and Rhythm: Normal rate and regular rhythm.     Heart  sounds: Normal heart sounds. No murmur heard. Pulmonary:     Effort: Pulmonary effort is normal. No respiratory distress.     Breath sounds: Normal breath sounds.  Abdominal:     General: Abdomen is flat.     Palpations: Abdomen is soft.     Tenderness: There is no guarding. Negative signs include Murphy's sign and McBurney's sign.     Comments: Minimal epigastric tenderness on exam.  Incision site midline.  It is not erythematic in appearance with no areas of fluctuance or induration noted.  Musculoskeletal:        General: No swelling.     Cervical back: Neck supple.     Comments: 01+ pitting edema noted bilateral lower extremities.  Patient states this is her baseline.  Skin:    General: Skin is warm and dry.     Capillary Refill: Capillary refill takes less than 2 seconds.  Neurological:     Mental Status: She is alert.  Psychiatric:        Mood and Affect: Mood normal.     ED Results / Procedures / Treatments   Labs (all labs ordered are listed, but only abnormal results are displayed) Labs Reviewed  COMPREHENSIVE METABOLIC PANEL - Abnormal; Notable for the following components:      Result Value   Chloride 97 (*)    Glucose, Bld 101 (*)    BUN 54 (*)    Creatinine, Ser 8.85 (*)    Total Protein 6.0 (*)    Albumin 2.5 (*)    GFR, Estimated 4 (*)    All other components within normal limits  CBC - Abnormal; Notable for the following components:   RBC 2.81 (*)    Hemoglobin 9.2 (*)    HCT 27.1 (*)    RDW 19.6 (*)    All other components within normal limits  I-STAT CHEM 8, ED - Abnormal; Notable for the following components:   Chloride 96 (*)    BUN 48 (*)    Creatinine, Ser 9.80 (*)    Calcium, Ion 1.14 (*)    Hemoglobin 10.9 (*)    HCT 32.0 (*)    All other components within normal limits  TROPONIN I (HIGH SENSITIVITY) - Abnormal; Notable for the following components:   Troponin I (High Sensitivity) 24 (*)    All other components within normal limits   LIPASE, BLOOD  URINALYSIS, ROUTINE W REFLEX MICROSCOPIC  TROPONIN I (HIGH SENSITIVITY)    EKG None  Radiology DG Chest 2 View  Result Date: 07/07/2022 CLINICAL DATA:  Epigastric pain, shortness of breath, recent cholecystectomy EXAM: CHEST - 2 VIEW COMPARISON:  06/12/2022 FINDINGS: Cardiac size is within normal limits. Thoracic aorta is tortuous. Lung fields are clear of any infiltrates or pulmonary edema. Increase in the AP diameter of chest suggests COPD. Left hemidiaphragm is elevated. There  is no pleural effusion or pneumothorax. IMPRESSION: There are no signs of pulmonary edema or focal pulmonary consolidation. Electronically Signed   By: Elmer Picker M.D.   On: 07/07/2022 13:03    Procedures Procedures    Medications Ordered in ED Medications  ondansetron (ZOFRAN) injection 4 mg (has no administration in time range)    ED Course/ Medical Decision Making/ A&P Clinical Course as of 07/08/22 1336  Wed Jul 07, 2022  2150 CT ABDOMEN PELVIS W CONTRAST Shows only bilioma and lung nodules. [RP]  Thu Jul 08, 2022  0157 Patient was able to tolerate p.o. without difficulty.  She was given a dose of oxycodone here in the emergency department with improvement of her symptoms.  Given a short course to take at home.  Repeat abdominal exam was benign.  Patient was given a dose of hydralazine for hypertension which improved her blood pressure.  Informed that she will need to follow-up with her surgeon and get dialysis tomorrow.  Return precautions discussed prior to discharge. [RP]    Clinical Course User Index [RP] Fransico Meadow, MD                           Medical Decision Making Amount and/or Complexity of Data Reviewed Labs: ordered. Radiology: ordered. Decision-making details documented in ED Course.  Risk Prescription drug management.   This patient presents to the ED for concern of postoperative abdominal pain, this involves an extensive number of treatment  options, and is a complaint that carries with it a high risk of complications and morbidity.  The differential diagnosis includes adhesion, perforation, peritonitis, abscess, dehiscence,   Co morbidities that complicate the patient evaluation  See HPI   Additional history obtained:  Additional history obtained from EMR External records from outside source obtained and reviewed including surgical note from 06/25/2022 indicating type of surgery.   Lab Tests:  I Ordered, and personally interpreted labs.  The pertinent results include: No leukocytosis noted.  Anemia with a hemoglobin 9.2 which seems to be patient's baseline based on prior CBCs.  Sodium potassium within normal range, BUN of 48, creatinine 9.8 with GFR of 4 showing decreased renal function patient baseline.  Initial troponin 24 with delta negative repeat of 22; patient not actively complaining of chest pain or shortness of breath and has elevated troponin at baseline.  It was ordered in triage for full assessment of patient was able to be obtained.  Urinalysis with rare bacteria and 100 protein.   Imaging Studies ordered:  I ordered imaging studies including chest x-ray, CT abdomen pelvis I independently visualized and interpreted imaging which showed  Chest x-ray: No acute cardiopulmonary process CT abdomen pelvis: Pending upon shift change. I agree with the radiologist interpretation   Cardiac Monitoring: / EKG:  The patient was maintained on a cardiac monitor.  I personally viewed and interpreted the cardiac monitored which showed an underlying rhythm of: Sinus rhythm   Consultations Obtained:  No consultations obtained at the time of discharge.  Problem List / ED Course / Critical interventions / Medication management  Abdominal pain I ordered medication including Zofran for nausea and oxycodone for pain   Reevaluation of the patient after these medicines showed that the patient improved I have reviewed the  patients home medicines and have made adjustments as needed   Social Determinants of Health:  Denies illicit drug use, tobacco use.   Test / Admission - Considered:  Abdominal pain  Vitals signs significant for hypertension blood pressure of 183/105.  Patient has had elevated blood pressure while in the emergency department.  Talk to attending physician about treating blood pressure pending CT imaging results.. Otherwise within normal range and stable throughout visit. Laboratory/imaging studies significant for: See above Patient's abdominal exam relatively benign as indicated above.  CT imaging pending upon shift change to dictate disposition of patient.  Patient's pain as well as nausea managed on the emergency department.  Treatment plan was discussed with attending physician Dr. Philip Aspen who is in agreement of plan and took over patient upon shift change.  Patient was stable upon shift change.        Final Clinical Impression(s) / ED Diagnoses Final diagnoses:  None    Rx / DC Orders ED Discharge Orders     None         [place], [place], [place] 07/08/22 1341    Fransico Meadow, MD 07/09/22 1137

## 2022-07-07 NOTE — ED Triage Notes (Signed)
Pt presents continued pain post cholecystectomy that was performed on 7/28. Pt reports associated nausea and diarrhea. Pt has not followed up with surgeon yet.

## 2022-07-07 NOTE — ED Provider Triage Note (Signed)
Emergency Medicine Provider Triage Evaluation Note  Diamond [name] , a 71 y.o. female  was evaluated in triage.  Pt complains of epigastric abdominal pain constantly for the past 2 weeks status post her cholecystectomy on 06-25-2022.  Patient was admitted for several weeks in the hospital for generalized abdominal pain and MRSA bacteremia.  She has not contacted her surgeon for this constant epigastric pain that she has been having.  She denies any chest pain but mentions she has some shortness of breath which is at her baseline.  She is a dialysis patient that missed dialysis on Tuesday, yesterday.  Review of Systems  Positive:  Negative:   Physical Exam  BP (!) 165/108 (BP Location: Right Arm)   Pulse 72   Temp 98.3 F (36.8 C) (Oral)   Resp 18   Ht '5\' 7"'$  (1.702 m)   Wt 61.2 kg   SpO2 100%   BMI 21.14 kg/m  Gen:   Awake, no distress   Resp:  Normal effort  MSK:   Moves extremities without difficulty  Other:  Abdomen is soft and nontender  Medical Decision Making  Medically screening exam initiated at 12:33 PM.  Appropriate orders placed.  Diamond [name] was informed that the remainder of the evaluation will be completed by another provider, this initial triage assessment does not replace that evaluation, and the importance of remaining in the ED until their evaluation is complete.  And that she has epigastric pain that is not reducible to palpation, concern for any MI cause.  We will order chest labs as well as CT abdomen for possible postop complication.   Sherrell Puller, PA-C 07/07/22 1236

## 2022-07-08 NOTE — ED Provider Notes (Signed)
Assumption of care   Physical Exam  BP (!) 183/105 (BP Location: Right Arm)   Pulse 71   Temp 98.6 F (37 C) (Oral)   Resp 16   Ht '5\' 7"'$  (1.702 m)   Wt 61.2 kg   SpO2 100%   BMI 21.14 kg/m   Physical Exam Vitals and nursing note reviewed.  Constitutional:      General: She is not in acute distress.    Appearance: She is well-developed.  HENT:     Head: Normocephalic and atraumatic.     Right Ear: External ear normal.     Left Ear: External ear normal.     Nose: Nose normal.  Eyes:     Extraocular Movements: Extraocular movements intact.     Conjunctiva/sclera: Conjunctivae normal.     Pupils: Pupils are equal, round, and reactive to light.  Pulmonary:     Effort: Pulmonary effort is normal. No respiratory distress.  Abdominal:     General: Abdomen is flat. There is no distension.     Palpations: Abdomen is soft. There is no mass.     Tenderness: There is no abdominal tenderness. There is no guarding.  Musculoskeletal:        General: No swelling.     Cervical back: Normal range of motion and neck supple.  Skin:    General: Skin is warm and dry.     Capillary Refill: Capillary refill takes less than 2 seconds.  Neurological:     Mental Status: She is alert and oriented to person, place, and time. Mental status is at baseline.  Psychiatric:        Mood and Affect: Mood normal.     Procedures  Procedures  ED Course / MDM   Clinical Course as of 07/08/22 1208  Wed Jul 07, 2022  2150 CT ABDOMEN PELVIS W CONTRAST Shows only bilioma and lung nodules. [RP]  Thu Jul 08, 2022  0157 Patient was able to tolerate p.o. without difficulty.  She was given a dose of oxycodone here in the emergency department with improvement of her symptoms.  Given a short course to take at home.  Repeat abdominal exam was benign.  Patient was given a dose of hydralazine for hypertension which improved her blood pressure.  Informed that she will need to follow-up with her surgeon and get  dialysis tomorrow.  Return precautions discussed prior to discharge. [RP]    Clinical Course User Index [RP] Fransico Meadow, MD   Medical Decision Making Continued care from [facility] after change of shift. 71 yo F with recent open cholecystectomy who presented with persistent abdominal pain. CT scan showed small bilioma but no other acute abnormalities. Repeat abdominal exam without significant ttp, rebound, or guarding. With her current exam and her appearing very well overall do not feel that there is an ongoing bile leak at this time and she was informed of this and instructed to follow-up with her surgeon in the new 2-3 days regarding this finding. Was given rx for oxycodone BID for 4 days for any breakthrough pain that she might have. Return precautions discussed prior to dc.   Amount and/or Complexity of Data Reviewed Labs: ordered. Radiology: ordered. Decision-making details documented in ED Course.  Risk Prescription drug management.        Fransico Meadow, MD 07/08/22 [phone]

## 2022-07-08 NOTE — ED Notes (Signed)
Pt verbalized understanding of d/c instructions, meds, and followup care. Denies questions. VSS, no distress noted. Reviewed DC plan to work with Psychologist, sport and exercise. Assisted to POV in wheelchair and taken home by daughter.

## 2022-07-09 DIAGNOSIS — Z992 Dependence on renal dialysis: Secondary | ICD-10-CM | POA: Diagnosis not present

## 2022-07-09 DIAGNOSIS — N186 End stage renal disease: Secondary | ICD-10-CM | POA: Diagnosis not present

## 2022-07-09 DIAGNOSIS — N2581 Secondary hyperparathyroidism of renal origin: Secondary | ICD-10-CM | POA: Diagnosis not present

## 2022-07-10 DIAGNOSIS — N2581 Secondary hyperparathyroidism of renal origin: Secondary | ICD-10-CM | POA: Diagnosis not present

## 2022-07-10 DIAGNOSIS — Z992 Dependence on renal dialysis: Secondary | ICD-10-CM | POA: Diagnosis not present

## 2022-07-10 DIAGNOSIS — N186 End stage renal disease: Secondary | ICD-10-CM | POA: Diagnosis not present

## 2022-07-13 DIAGNOSIS — Z992 Dependence on renal dialysis: Secondary | ICD-10-CM | POA: Diagnosis not present

## 2022-07-13 DIAGNOSIS — N186 End stage renal disease: Secondary | ICD-10-CM | POA: Diagnosis not present

## 2022-07-13 DIAGNOSIS — N2581 Secondary hyperparathyroidism of renal origin: Secondary | ICD-10-CM | POA: Diagnosis not present

## 2022-07-17 DIAGNOSIS — N2581 Secondary hyperparathyroidism of renal origin: Secondary | ICD-10-CM | POA: Diagnosis not present

## 2022-07-17 DIAGNOSIS — N186 End stage renal disease: Secondary | ICD-10-CM | POA: Diagnosis not present

## 2022-07-17 DIAGNOSIS — Z992 Dependence on renal dialysis: Secondary | ICD-10-CM | POA: Diagnosis not present

## 2022-07-20 ENCOUNTER — Other Ambulatory Visit: Payer: Self-pay | Admitting: *Deleted

## 2022-07-20 ENCOUNTER — Encounter: Payer: Self-pay | Admitting: *Deleted

## 2022-07-20 NOTE — Patient Outreach (Signed)
  Care Coordination [facility] Note Transition Care Management Follow-up Telephone Call Date of discharge and from where: 07/15/22 from [facility] How have you been since you were released from the hospital? Good  Any questions or concerns? Yes post op abdominal pain, limited discharge pain medicine  Items Reviewed: Did the pt receive and understand the discharge instructions provided? Yes  Medications obtained and verified? Yes  Other? No  Any new allergies since your discharge? No  Dietary orders reviewed? Yes Do you have support at home? Yes   Home Care and Equipment/Supplies: Were home health services ordered? yes If so, what is the name of the agency? [facility] Has the agency set up a time to come to the patient's home? yes Were any new equipment or medical supplies ordered?  No What is the name of the medical supply agency? N/a Were you able to get the supplies/equipment? not applicable Do you have any questions related to the use of the equipment or supplies? No  Functional Questionnaire: (I = Independent and D = Dependent) ADLs: I  Bathing/Dressing- I  Meal Prep- I  Eating- I  Maintaining continence- I  Transferring/Ambulation- I  Managing Meds- I  Follow up appointments reviewed:  PCP Hospital f/u appt confirmed? Yes  Scheduled to see Dr Vista Lawman on 07/23/22 @ 2 pm. [facility] f/u appt confirmed? Yes  Scheduled to see 07/21/22 on 07/21/22 @ 3:30. Are transportation arrangements needed? Yes  If their condition worsens, is the pt aware to call PCP or go to the Emergency Dept.? Yes Was the patient provided with contact information for the PCP's office or ED? Yes Was to pt encouraged to call back with questions or concerns? Yes  SDOH assessments and interventions completed:   Yes  Care Coordination Interventions Activated:  Yes   Care Coordination Interventions:   discussed pain management medicines, limited at discharge, Encouraged to get refill  from specialist on 07/21/22     Encounter Outcome:  Pt. Visit Completed    Rodrecus Belsky L. Lavina Hamman, RN, BSN, [place] Coordinator Office number [phone]

## 2022-07-20 NOTE — Patient Instructions (Signed)
Visit Information  Thank you for taking time to visit with me today. Please don't hesitate to contact me if I can be of assistance to you.   Following are the goals we discussed today:   Goals Addressed               This Visit's Progress     Patient Stated     post op management of pain [facility]) (pt-stated)   Not on track     Start date 07/20/22  Care Coordination Interventions: Reviewed provider established plan for pain management Discussed importance of adherence to all scheduled medical appointments Screening for signs and symptoms of depression related to chronic disease state  Assessed social determinant of health barriers Encouraged patient to discuss pain medicine refill with Dr Juleen China on 07/21/22. Discussed the reason for limited pain medicine upon discharge with hope patient will follow up with pcp and/or specialist within 1-2 weeks of discharge. Discussed constipation related to narcotics        Our next appointment is by telephone on 07/29/22 at 3 pm   Please call the care guide team at [phone] if you need to cancel or reschedule your appointment.   If you are experiencing a Mental Health or [place] or need someone to talk to, please call the Suicide and Crisis Lifeline: 988 call the [place] National Suicide Prevention Lifeline: 863-088-2670 or TTY: 480-515-4937 TTY 580 103 4379) to talk to a trained counselor call 1-800-273-TALK (toll free, 24 hour hotline) go to [facility] [address] [place] [phone]) call 911   Patient verbalizes understanding of instructions and care plan provided today and agrees to view in [place]. Active MyChart status and patient understanding of how to access instructions and care plan via MyChart confirmed with patient.     The patient has been provided with contact information for the care management team and has been advised to call with any health related questions or  concerns.   [place] Lavina Hamman, RN, BSN, [place] Coordinator Office number [phone]

## 2022-07-21 ENCOUNTER — Encounter: Payer: Self-pay | Admitting: Internal Medicine

## 2022-07-21 ENCOUNTER — Telehealth: Payer: Self-pay

## 2022-07-21 ENCOUNTER — Ambulatory Visit (INDEPENDENT_AMBULATORY_CARE_PROVIDER_SITE_OTHER): Payer: Medicare HMO | Admitting: Internal Medicine

## 2022-07-21 ENCOUNTER — Other Ambulatory Visit: Payer: Self-pay

## 2022-07-21 VITALS — BP 209/109 | HR 69 | Temp 97.9°F

## 2022-07-21 DIAGNOSIS — I160 Hypertensive urgency: Secondary | ICD-10-CM | POA: Diagnosis not present

## 2022-07-21 DIAGNOSIS — Z992 Dependence on renal dialysis: Secondary | ICD-10-CM

## 2022-07-21 DIAGNOSIS — N186 End stage renal disease: Secondary | ICD-10-CM

## 2022-07-21 DIAGNOSIS — B9562 Methicillin resistant Staphylococcus aureus infection as the cause of diseases classified elsewhere: Secondary | ICD-10-CM

## 2022-07-21 DIAGNOSIS — B1920 Unspecified viral hepatitis C without hepatic coma: Secondary | ICD-10-CM | POA: Diagnosis not present

## 2022-07-21 DIAGNOSIS — M4620 Osteomyelitis of vertebra, site unspecified: Secondary | ICD-10-CM

## 2022-07-21 DIAGNOSIS — R7881 Bacteremia: Secondary | ICD-10-CM | POA: Diagnosis not present

## 2022-07-21 DIAGNOSIS — B182 Chronic viral hepatitis C: Secondary | ICD-10-CM

## 2022-07-21 NOTE — Assessment & Plan Note (Signed)
It appears an HCV Ab was checked during her admission and was reactive.  No follow up testing was done so will get HCV RNA today.

## 2022-07-21 NOTE — Assessment & Plan Note (Signed)
Her BP today is very high.  She missed HD yesterday.  She reports being taken off 1 of her BP meds during recent admission and is currently only on 1 agent.  She is asymptomatic.  I encouraged her to follow up with her PCP ASAP for better BP management and to present to ED if she develops any symptoms.  I also advised her to attend all HD sessions.

## 2022-07-21 NOTE — Telephone Encounter (Signed)
Spoke with Shawn at [facility] and confirmed they have stopped IV Vancomycin per Dr. Juleen China.   P [phone]  Beryle Flock, RN

## 2022-07-21 NOTE — Assessment & Plan Note (Signed)
She has completed her antibiotic therapy for MRSA bacteremia and has no current signs or symptoms of relapsed infection.  Will repeat blood cultures today but otherwise can plan to follow up as needed.

## 2022-07-21 NOTE — Patient Instructions (Signed)
Please follow up with your PCP about blood pressure this week and if you notice any headaches, chest pain, vision changes please go to the ED.  Please attend scheduled dialysis sessions.

## 2022-07-21 NOTE — Assessment & Plan Note (Signed)
She is dialyzing now via AV fistula after her HD catheter was removed during last admission.

## 2022-07-21 NOTE — Assessment & Plan Note (Signed)
Her imaging in January 2023 raised concern for discitis/OM vs degenerative disease and she received 8 weeks of antibiotics at that time.  No concern right now for spinal infection as her back pain appears stable and chronic and she has completed another course of antibiotics for her bacteremia.

## 2022-07-21 NOTE — Progress Notes (Signed)
[facility]  CHIEF COMPLAINT:    Follow up for MRSA bacteremia  SUBJECTIVE:    Diamond [name] is a 71 y.o. female with PMHx as below who presents to the clinic for MRSA Bacteremia.   She is here today for hospital follow up.  She was admitted 06/12/22 - 4/85/46 with a complicated hospitalization.  She was found to have MRSA bacteremia likely 2/2 to HD catheter which was removed 06/14/22.  TEE was negative for vegetation but similar very small mobile echodensity seen in the RVOT.  Repeat blood cultures were cleared.  She was recommended to be on IV vancomycin with HD through 07/13/22.    This was all in the setting of a history of MRSA bacteremia complicated by E7-O3 vertebral infection and mitral valve thickening/fibrinous density (TTE) with possible infected right atrial thrombus/vegetation (TEE) in January 2023.  Status post 8 weeks of vancomycin with appropriate line holiday at that time (old right IJ TDC removed 12/19/21, repeat cx negative, new right IJ TDC placed 12/23/21).  She is doing okay today.  She mentions diarrhea, memory loss, and back pain that is chronic.  She has follow up with her PCP on Friday.  She has no headaches, vision change, or chest pain today.  She felt lightheaded about 3 weeks ago and had a headache then.  She missed dialysis yesterday.     Please see A&P for the details of today's visit and status of the patient's medical problems.   Patient's Medications  New Prescriptions   No medications on file  Previous Medications   ACETAMINOPHEN (TYLENOL) 325 MG TABLET    Take 2 tablets (650 mg total) by mouth 4 (four) times daily.   ALBUTEROL (VENTOLIN HFA) 108 (90 BASE) MCG/ACT INHALER    Inhale 2 puffs into the lungs every 4 (four) hours as needed for wheezing or shortness of breath.   APIXABAN (ELIQUIS) 2.5 MG TABS TABLET    Take 1 tablet (2.5 mg total) by mouth 2 (two) times daily.   CYCLOBENZAPRINE (FLEXERIL) 5 MG TABLET    Take  2.5 mg by mouth daily as needed for muscle spasms.   DOCUSATE SODIUM (COLACE) 100 MG CAPSULE    Take 1 capsule (100 mg total) by mouth 2 (two) times daily.   FLUTICASONE-SALMETEROL (ADVAIR) 250-50 MCG/ACT AEPB    Inhale 1 puff into the lungs 2 (two) times daily.   LIDOCAINE 4 % PTCH    Apply 1 patch topically daily as needed (pain).   METOPROLOL TARTRATE (LOPRESSOR) 50 MG TABLET    Take 1 tablet (50 mg total) by mouth 2 (two) times daily.   ONDANSETRON (ZOFRAN-ODT) 4 MG DISINTEGRATING TABLET    Take 1 tablet (4 mg total) by mouth every 8 (eight) hours as needed for nausea or vomiting.   OXYCODONE (ROXICODONE) 5 MG IMMEDIATE RELEASE TABLET    Take 1 tablet (5 mg total) by mouth 2 (two) times daily as needed for up to 8 doses for severe pain or breakthrough pain.   PANTOPRAZOLE (PROTONIX) 40 MG TABLET    Take 1 tablet (40 mg total) by mouth 2 (two) times daily.   SEVELAMER CARBONATE (RENVELA) 800 MG TABLET    Take 2 tablets (1,600 mg total) by mouth 3 (three) times daily with meals.  Modified Medications   No medications on file  Discontinued Medications   No medications on file      Past Medical History:  Diagnosis  Date   Anemia    Asthma    Atrial flutter with rapid ventricular response ([place]) 12/25/2021   Chronic kidney disease    dialysis Tues Thurs Sat   Dysrhythmia    PAF 10/2019 in setting of COVID-19 PNA   Gout    Hypertension     Social History   Tobacco Use   Smoking status: Never    Passive exposure: Never   Smokeless tobacco: Never  Vaping Use   Vaping Use: Never used  Substance Use Topics   Alcohol use: No   Drug use: No    Family History  Problem Relation Age of Onset   Hypertension Mother    Hypertension Father    Colon cancer Brother    Lung cancer Brother     Allergies  Allergen Reactions   Shellfish Allergy     Unknown reaction    Other Rash    Pt states anesthesia makes her brake out in rash    Review of Systems  Constitutional: Negative.    Respiratory: Negative.    Cardiovascular: Negative.   Gastrointestinal:  Positive for diarrhea. Negative for abdominal pain.  Musculoskeletal:  Positive for back pain.  Psychiatric/Behavioral:  Positive for memory loss.      OBJECTIVE:    Vitals:   07/21/22 1545 07/21/22 1546  BP: (!) 214/115 (!) 209/109  Pulse: 69   Temp: 97.9 F (36.6 C)   TempSrc: Oral   SpO2: 100%    There is no height or weight on file to calculate BMI.  Physical Exam Constitutional:      General: She is not in acute distress.    Appearance: Normal appearance.  Eyes:     Extraocular Movements: Extraocular movements intact.     Conjunctiva/sclera: Conjunctivae normal.  Pulmonary:     Effort: Pulmonary effort is normal. No respiratory distress.  Musculoskeletal:     Cervical back: Normal range of motion and neck supple.  Skin:    General: Skin is warm and dry.  Neurological:     General: No focal deficit present.     Mental Status: She is alert and oriented to person, place, and time.  Psychiatric:        Mood and Affect: Mood normal.        Behavior: Behavior normal.      Labs and Microbiology:    Latest Ref Rng & Units 07/07/2022    1:31 PM 07/07/2022   12:32 PM 06/29/2022    2:36 PM  CBC  WBC 4.0 - 10.5 K/uL  6.0  5.6   Hemoglobin 12.0 - 15.0 g/dL 10.9  9.2  9.9   Hematocrit 36.0 - 46.0 % 32.0  27.1  28.3   Platelets 150 - 400 K/uL  263  190       Latest Ref Rng & Units 07/07/2022    1:31 PM 07/07/2022   12:32 PM 06/28/2022    2:29 AM  CMP  Glucose 70 - 99 mg/dL 94  101  88   BUN 8 - 23 mg/dL 48  54  26   Creatinine 0.44 - 1.00 mg/dL 9.80  8.85  5.21   Sodium 135 - 145 mmol/L 135  136  130   Potassium 3.5 - 5.1 mmol/L 3.7  3.7  4.1   Chloride 98 - 111 mmol/L 96  97  91   CO2 22 - 32 mmol/L  25  26   Calcium 8.9 - 10.3 mg/dL  9.1  8.4  Total Protein 6.5 - 8.1 g/dL  6.0  5.3   Total Bilirubin 0.3 - 1.2 mg/dL  0.8  1.0   Alkaline Phos 38 - 126 U/L  72  82   AST 15 - 41 U/L  17  66    ALT 0 - 44 U/L  7  11        ASSESSMENT & PLAN:    MRSA bacteremia She has completed her antibiotic therapy for MRSA bacteremia and has no current signs or symptoms of relapsed infection.  Will repeat blood cultures today but otherwise can plan to follow up as needed.   ESRD on dialysis [facility]) She is dialyzing now via AV fistula after her HD catheter was removed during last admission.  Unspecified viral hepatitis C without hepatic coma It appears an HCV Ab was checked during her admission and was reactive.  No follow up testing was done so will get HCV RNA today.   Asymptomatic hypertensive urgency Her BP today is very high.  She missed HD yesterday.  She reports being taken off 1 of her BP meds during recent admission and is currently only on 1 agent.  She is asymptomatic.  I encouraged her to follow up with her PCP ASAP for better BP management and to present to ED if she develops any symptoms.  I also advised her to attend all HD sessions.   Vertebral osteomyelitis ([place]) Her imaging in January 2023 raised concern for discitis/OM vs degenerative disease and she received 8 weeks of antibiotics at that time.  No concern right now for spinal infection as her back pain appears stable and chronic and she has completed another course of antibiotics for her bacteremia.     Orders Placed This Encounter  Procedures   Blood culture (routine single)   Blood culture (routine single)   Hepatitis C RNA quantitative      Rayn[facility] 07/21/2022, 4:23 PM  I have personally spent 40 minutes involved in face-to-face and non-face-to-face activities for this patient on the day of the visit. Professional time spent includes the following activities: Preparing to see the patient (review of tests), Obtaining and/or reviewing separately obtained history (admission/discharge record), Performing a medically appropriate examination  and/or evaluation , Ordering medications/tests/procedures, referring and communicating with other health care professionals, Documenting clinical information in the EMR, Independently interpreting results (not separately reported), Communicating results to the patient/family/caregiver, Counseling and educating the patient/family/caregiver and Care coordination (not separately reported).

## 2022-07-22 DIAGNOSIS — Z992 Dependence on renal dialysis: Secondary | ICD-10-CM | POA: Diagnosis not present

## 2022-07-22 DIAGNOSIS — N2581 Secondary hyperparathyroidism of renal origin: Secondary | ICD-10-CM | POA: Diagnosis not present

## 2022-07-22 DIAGNOSIS — N186 End stage renal disease: Secondary | ICD-10-CM | POA: Diagnosis not present

## 2022-07-23 DIAGNOSIS — M4646 Discitis, unspecified, lumbar region: Secondary | ICD-10-CM | POA: Diagnosis not present

## 2022-07-23 DIAGNOSIS — I10 Essential (primary) hypertension: Secondary | ICD-10-CM | POA: Diagnosis not present

## 2022-07-23 DIAGNOSIS — K219 Gastro-esophageal reflux disease without esophagitis: Secondary | ICD-10-CM | POA: Diagnosis not present

## 2022-07-23 DIAGNOSIS — D539 Nutritional anemia, unspecified: Secondary | ICD-10-CM | POA: Diagnosis not present

## 2022-07-23 DIAGNOSIS — M109 Gout, unspecified: Secondary | ICD-10-CM | POA: Diagnosis not present

## 2022-07-23 DIAGNOSIS — E559 Vitamin D deficiency, unspecified: Secondary | ICD-10-CM | POA: Diagnosis not present

## 2022-07-23 DIAGNOSIS — Z992 Dependence on renal dialysis: Secondary | ICD-10-CM | POA: Diagnosis not present

## 2022-07-23 DIAGNOSIS — J4530 Mild persistent asthma, uncomplicated: Secondary | ICD-10-CM | POA: Diagnosis not present

## 2022-07-23 DIAGNOSIS — N186 End stage renal disease: Secondary | ICD-10-CM | POA: Diagnosis not present

## 2022-07-24 ENCOUNTER — Other Ambulatory Visit: Payer: Self-pay

## 2022-07-24 ENCOUNTER — Emergency Department (HOSPITAL_COMMUNITY)
Admission: EM | Admit: 2022-07-24 | Discharge: 2022-07-24 | Discharge status: Against Medical Advice | Payer: Medicare HMO | Source: Home / Self Care

## 2022-07-24 ENCOUNTER — Encounter (HOSPITAL_COMMUNITY): Payer: Self-pay | Admitting: Emergency Medicine

## 2022-07-24 DIAGNOSIS — D631 Anemia in chronic kidney disease: Secondary | ICD-10-CM | POA: Diagnosis present

## 2022-07-24 DIAGNOSIS — M898X9 Other specified disorders of bone, unspecified site: Secondary | ICD-10-CM | POA: Diagnosis present

## 2022-07-24 DIAGNOSIS — E440 Moderate protein-calorie malnutrition: Secondary | ICD-10-CM | POA: Diagnosis present

## 2022-07-24 DIAGNOSIS — Z7901 Long term (current) use of anticoagulants: Secondary | ICD-10-CM | POA: Diagnosis not present

## 2022-07-24 DIAGNOSIS — M549 Dorsalgia, unspecified: Secondary | ICD-10-CM | POA: Diagnosis not present

## 2022-07-24 DIAGNOSIS — I10 Essential (primary) hypertension: Secondary | ICD-10-CM | POA: Insufficient documentation

## 2022-07-24 DIAGNOSIS — W19XXXA Unspecified fall, initial encounter: Secondary | ICD-10-CM | POA: Diagnosis present

## 2022-07-24 DIAGNOSIS — I120 Hypertensive chronic kidney disease with stage 5 chronic kidney disease or end stage renal disease: Secondary | ICD-10-CM | POA: Diagnosis present

## 2022-07-24 DIAGNOSIS — Z5321 Procedure and treatment not carried out due to patient leaving prior to being seen by health care provider: Secondary | ICD-10-CM | POA: Insufficient documentation

## 2022-07-24 DIAGNOSIS — S32030D Wedge compression fracture of third lumbar vertebra, subsequent encounter for fracture with routine healing: Secondary | ICD-10-CM | POA: Diagnosis not present

## 2022-07-24 DIAGNOSIS — Z7401 Bed confinement status: Secondary | ICD-10-CM | POA: Diagnosis not present

## 2022-07-24 DIAGNOSIS — M545 Low back pain, unspecified: Secondary | ICD-10-CM | POA: Insufficient documentation

## 2022-07-24 DIAGNOSIS — Z992 Dependence on renal dialysis: Secondary | ICD-10-CM | POA: Diagnosis not present

## 2022-07-24 DIAGNOSIS — M4647 Discitis, unspecified, lumbosacral region: Secondary | ICD-10-CM | POA: Diagnosis present

## 2022-07-24 DIAGNOSIS — M6281 Muscle weakness (generalized): Secondary | ICD-10-CM | POA: Diagnosis not present

## 2022-07-24 DIAGNOSIS — N250 Renal osteodystrophy: Secondary | ICD-10-CM | POA: Diagnosis not present

## 2022-07-24 DIAGNOSIS — N186 End stage renal disease: Secondary | ICD-10-CM | POA: Diagnosis present

## 2022-07-24 DIAGNOSIS — M4646 Discitis, unspecified, lumbar region: Secondary | ICD-10-CM | POA: Diagnosis present

## 2022-07-24 DIAGNOSIS — Z8249 Family history of ischemic heart disease and other diseases of the circulatory system: Secondary | ICD-10-CM | POA: Diagnosis not present

## 2022-07-24 DIAGNOSIS — I4892 Unspecified atrial flutter: Secondary | ICD-10-CM | POA: Diagnosis present

## 2022-07-24 DIAGNOSIS — Z79899 Other long term (current) drug therapy: Secondary | ICD-10-CM | POA: Diagnosis not present

## 2022-07-24 DIAGNOSIS — G8929 Other chronic pain: Secondary | ICD-10-CM | POA: Diagnosis present

## 2022-07-24 DIAGNOSIS — R2689 Other abnormalities of gait and mobility: Secondary | ICD-10-CM | POA: Diagnosis not present

## 2022-07-24 DIAGNOSIS — B1920 Unspecified viral hepatitis C without hepatic coma: Secondary | ICD-10-CM | POA: Diagnosis present

## 2022-07-24 DIAGNOSIS — S32030A Wedge compression fracture of third lumbar vertebra, initial encounter for closed fracture: Secondary | ICD-10-CM | POA: Diagnosis not present

## 2022-07-24 DIAGNOSIS — Y92009 Unspecified place in unspecified non-institutional (private) residence as the place of occurrence of the external cause: Secondary | ICD-10-CM | POA: Diagnosis not present

## 2022-07-24 DIAGNOSIS — Z91013 Allergy to seafood: Secondary | ICD-10-CM | POA: Diagnosis not present

## 2022-07-24 DIAGNOSIS — J45909 Unspecified asthma, uncomplicated: Secondary | ICD-10-CM | POA: Diagnosis present

## 2022-07-24 DIAGNOSIS — Z681 Body mass index (BMI) 19 or less, adult: Secondary | ICD-10-CM | POA: Diagnosis not present

## 2022-07-24 DIAGNOSIS — M48061 Spinal stenosis, lumbar region without neurogenic claudication: Secondary | ICD-10-CM | POA: Diagnosis not present

## 2022-07-24 DIAGNOSIS — N2581 Secondary hyperparathyroidism of renal origin: Secondary | ICD-10-CM | POA: Diagnosis not present

## 2022-07-24 LAB — URINALYSIS, ROUTINE W REFLEX MICROSCOPIC
Bilirubin Urine: NEGATIVE
Glucose, UA: NEGATIVE mg/dL
Hgb urine dipstick: NEGATIVE
Ketones, ur: NEGATIVE mg/dL
Leukocytes,Ua: NEGATIVE
Nitrite: NEGATIVE
Protein, ur: 100 mg/dL — AB
Specific Gravity, Urine: 1.02 (ref 1.005–1.030)
pH: 8.5 — ABNORMAL HIGH (ref 5.0–8.0)

## 2022-07-24 LAB — CBC WITH DIFFERENTIAL/PLATELET
Abs Immature Granulocytes: 0.01 10*3/uL (ref 0.00–0.07)
Basophils Absolute: 0 10*3/uL (ref 0.0–0.1)
Basophils Relative: 1 %
Eosinophils Absolute: 0.2 10*3/uL (ref 0.0–0.5)
Eosinophils Relative: 4 %
HCT: 36.4 % (ref 36.0–46.0)
Hemoglobin: 11.1 g/dL — ABNORMAL LOW (ref 12.0–15.0)
Immature Granulocytes: 0 %
Lymphocytes Relative: 23 %
Lymphs Abs: 1 10*3/uL (ref 0.7–4.0)
MCH: 32.3 pg (ref 26.0–34.0)
MCHC: 30.5 g/dL (ref 30.0–36.0)
MCV: 105.8 fL — ABNORMAL HIGH (ref 80.0–100.0)
Monocytes Absolute: 0.4 10*3/uL (ref 0.1–1.0)
Monocytes Relative: 10 %
Neutro Abs: 2.8 10*3/uL (ref 1.7–7.7)
Neutrophils Relative %: 62 %
Platelets: 172 10*3/uL (ref 150–400)
RBC: 3.44 MIL/uL — ABNORMAL LOW (ref 3.87–5.11)
RDW: 19.4 % — ABNORMAL HIGH (ref 11.5–15.5)
WBC: 4.4 10*3/uL (ref 4.0–10.5)
nRBC: 0 % (ref 0.0–0.2)

## 2022-07-24 LAB — URINALYSIS, MICROSCOPIC (REFLEX)

## 2022-07-24 LAB — BASIC METABOLIC PANEL
Anion gap: 12 (ref 5–15)
BUN: 7 mg/dL — ABNORMAL LOW (ref 8–23)
CO2: 28 mmol/L (ref 22–32)
Calcium: 8.9 mg/dL (ref 8.9–10.3)
Chloride: 100 mmol/L (ref 98–111)
Creatinine, Ser: 3.98 mg/dL — ABNORMAL HIGH (ref 0.44–1.00)
GFR, Estimated: 11 mL/min — ABNORMAL LOW (ref >60)
Glucose, Bld: 98 mg/dL (ref 70–99)
Potassium: 3.6 mmol/L (ref 3.5–5.1)
Sodium: 140 mmol/L (ref 135–145)

## 2022-07-24 LAB — HEPATITIS C RNA QUANTITATIVE
HCV Quantitative Log: 5.97 log IU/mL — ABNORMAL HIGH
HCV RNA, PCR, QN: 941000 IU/mL — ABNORMAL HIGH

## 2022-07-24 NOTE — ED Notes (Signed)
Patient left without being seen.

## 2022-07-24 NOTE — ED Triage Notes (Signed)
Patient reports low back pain for several weeks , denies injury or urinary discomfort , hemodialysis q Tues/Thurs/Sat , hypertensive at arrival.

## 2022-07-25 ENCOUNTER — Inpatient Hospital Stay (HOSPITAL_COMMUNITY)
Admission: EM | Admit: 2022-07-25 | Discharge: 2022-07-29 | DRG: 551 | Disposition: A | Discharge status: Skilled Nursing Facility | Payer: Medicare HMO | Attending: Internal Medicine | Admitting: Internal Medicine

## 2022-07-25 ENCOUNTER — Emergency Department (HOSPITAL_COMMUNITY): Payer: Medicare HMO

## 2022-07-25 DIAGNOSIS — D631 Anemia in chronic kidney disease: Secondary | ICD-10-CM | POA: Diagnosis present

## 2022-07-25 DIAGNOSIS — Z992 Dependence on renal dialysis: Secondary | ICD-10-CM

## 2022-07-25 DIAGNOSIS — M4646 Discitis, unspecified, lumbar region: Principal | ICD-10-CM

## 2022-07-25 DIAGNOSIS — B1920 Unspecified viral hepatitis C without hepatic coma: Secondary | ICD-10-CM | POA: Diagnosis present

## 2022-07-25 DIAGNOSIS — Z8249 Family history of ischemic heart disease and other diseases of the circulatory system: Secondary | ICD-10-CM

## 2022-07-25 DIAGNOSIS — I120 Hypertensive chronic kidney disease with stage 5 chronic kidney disease or end stage renal disease: Secondary | ICD-10-CM | POA: Diagnosis present

## 2022-07-25 DIAGNOSIS — Y92009 Unspecified place in unspecified non-institutional (private) residence as the place of occurrence of the external cause: Secondary | ICD-10-CM

## 2022-07-25 DIAGNOSIS — G8929 Other chronic pain: Secondary | ICD-10-CM | POA: Diagnosis present

## 2022-07-25 DIAGNOSIS — S32030A Wedge compression fracture of third lumbar vertebra, initial encounter for closed fracture: Secondary | ICD-10-CM

## 2022-07-25 DIAGNOSIS — Z79899 Other long term (current) drug therapy: Secondary | ICD-10-CM

## 2022-07-25 DIAGNOSIS — W19XXXA Unspecified fall, initial encounter: Secondary | ICD-10-CM | POA: Diagnosis present

## 2022-07-25 DIAGNOSIS — E440 Moderate protein-calorie malnutrition: Secondary | ICD-10-CM | POA: Diagnosis present

## 2022-07-25 DIAGNOSIS — M4647 Discitis, unspecified, lumbosacral region: Principal | ICD-10-CM | POA: Diagnosis present

## 2022-07-25 DIAGNOSIS — M898X9 Other specified disorders of bone, unspecified site: Secondary | ICD-10-CM | POA: Diagnosis present

## 2022-07-25 DIAGNOSIS — Z7901 Long term (current) use of anticoagulants: Secondary | ICD-10-CM

## 2022-07-25 DIAGNOSIS — J45909 Unspecified asthma, uncomplicated: Secondary | ICD-10-CM | POA: Diagnosis present

## 2022-07-25 DIAGNOSIS — I10 Essential (primary) hypertension: Secondary | ICD-10-CM | POA: Diagnosis present

## 2022-07-25 DIAGNOSIS — Z681 Body mass index (BMI) 19 or less, adult: Secondary | ICD-10-CM

## 2022-07-25 DIAGNOSIS — N186 End stage renal disease: Secondary | ICD-10-CM | POA: Diagnosis present

## 2022-07-25 DIAGNOSIS — I4892 Unspecified atrial flutter: Secondary | ICD-10-CM | POA: Diagnosis present

## 2022-07-25 DIAGNOSIS — Z91013 Allergy to seafood: Secondary | ICD-10-CM

## 2022-07-25 NOTE — ED Notes (Signed)
Unable to obtain labs pt.refused another attemt at

## 2022-07-25 NOTE — ED Triage Notes (Signed)
Pt reports lower back pain that started 2 days ago, pt states pain is centralized and on the L side, non-radiating. Pt denies urinary symptoms, states she had 2 falls last week where she tripped and fell. Pt uses walker/cane at baseline, gets dialysis T/Th/Sat, no missed sessions.

## 2022-07-25 NOTE — ED Provider Triage Note (Signed)
Emergency Medicine Provider Triage Evaluation Note  Diamond [name] , a 71 y.o. female  was evaluated in triage.  Pt complains of left-sided low back pain.  Patient fell 2 weeks ago.  Pain started 2 days ago.  She is a dialysis patient compliant with her dialysis treatments.  She denies chest pain, lightheadedness, dysuria.  Review of Systems  Positive: As above Negative: As above  Physical Exam  BP (!) 196/120 (BP Location: Right Arm)   Pulse 77   Temp 98.4 F (36.9 C) (Oral)   Resp 17   SpO2 100%  Gen:   Awake, no distress  Resp:  Normal effort MSK:   Moves extremities without difficulty  Other:  Cervical, thoracic, lumbar spine without tenderness to palpation.  Lumbar paraspinal muscle tenderness to palpation.  Medical Decision Making  Medically screening exam initiated at 5:19 PM.  Appropriate orders placed.  Diamond [name] was informed that the remainder of the evaluation will be completed by another provider, this initial triage assessment does not replace that evaluation, and the importance of remaining in the ED until their evaluation is complete.     Evlyn Courier, PA-C 07/25/22 1720

## 2022-07-25 NOTE — ED Notes (Signed)
Patient states the wait is too long and she is leaving states her son is on his way to come get her

## 2022-07-25 NOTE — ED Notes (Signed)
Patient placed in recliner for comfort and warm blankets given

## 2022-07-26 ENCOUNTER — Emergency Department (HOSPITAL_COMMUNITY): Payer: Medicare HMO

## 2022-07-26 ENCOUNTER — Encounter (HOSPITAL_COMMUNITY): Payer: Self-pay | Admitting: Internal Medicine

## 2022-07-26 DIAGNOSIS — Y92009 Unspecified place in unspecified non-institutional (private) residence as the place of occurrence of the external cause: Secondary | ICD-10-CM | POA: Diagnosis not present

## 2022-07-26 DIAGNOSIS — M4646 Discitis, unspecified, lumbar region: Secondary | ICD-10-CM | POA: Diagnosis present

## 2022-07-26 DIAGNOSIS — I4892 Unspecified atrial flutter: Secondary | ICD-10-CM | POA: Diagnosis present

## 2022-07-26 DIAGNOSIS — D631 Anemia in chronic kidney disease: Secondary | ICD-10-CM | POA: Diagnosis present

## 2022-07-26 DIAGNOSIS — Z992 Dependence on renal dialysis: Secondary | ICD-10-CM | POA: Diagnosis not present

## 2022-07-26 DIAGNOSIS — B1920 Unspecified viral hepatitis C without hepatic coma: Secondary | ICD-10-CM | POA: Diagnosis present

## 2022-07-26 DIAGNOSIS — N186 End stage renal disease: Secondary | ICD-10-CM | POA: Diagnosis present

## 2022-07-26 DIAGNOSIS — G8929 Other chronic pain: Secondary | ICD-10-CM | POA: Diagnosis present

## 2022-07-26 DIAGNOSIS — J45909 Unspecified asthma, uncomplicated: Secondary | ICD-10-CM | POA: Diagnosis present

## 2022-07-26 DIAGNOSIS — Z681 Body mass index (BMI) 19 or less, adult: Secondary | ICD-10-CM | POA: Diagnosis not present

## 2022-07-26 DIAGNOSIS — W19XXXA Unspecified fall, initial encounter: Secondary | ICD-10-CM | POA: Diagnosis present

## 2022-07-26 DIAGNOSIS — E440 Moderate protein-calorie malnutrition: Secondary | ICD-10-CM | POA: Diagnosis present

## 2022-07-26 DIAGNOSIS — M4647 Discitis, unspecified, lumbosacral region: Secondary | ICD-10-CM | POA: Diagnosis present

## 2022-07-26 DIAGNOSIS — Z8249 Family history of ischemic heart disease and other diseases of the circulatory system: Secondary | ICD-10-CM | POA: Diagnosis not present

## 2022-07-26 DIAGNOSIS — I120 Hypertensive chronic kidney disease with stage 5 chronic kidney disease or end stage renal disease: Secondary | ICD-10-CM | POA: Diagnosis present

## 2022-07-26 DIAGNOSIS — Z91013 Allergy to seafood: Secondary | ICD-10-CM | POA: Diagnosis not present

## 2022-07-26 DIAGNOSIS — Z7901 Long term (current) use of anticoagulants: Secondary | ICD-10-CM | POA: Diagnosis not present

## 2022-07-26 DIAGNOSIS — M898X9 Other specified disorders of bone, unspecified site: Secondary | ICD-10-CM | POA: Diagnosis present

## 2022-07-26 DIAGNOSIS — Z79899 Other long term (current) drug therapy: Secondary | ICD-10-CM | POA: Diagnosis not present

## 2022-07-26 LAB — COMPREHENSIVE METABOLIC PANEL
ALT: 6 U/L (ref 0–44)
AST: 13 U/L — ABNORMAL LOW (ref 15–41)
Albumin: 2.7 g/dL — ABNORMAL LOW (ref 3.5–5.0)
Alkaline Phosphatase: 54 U/L (ref 38–126)
Anion gap: 12 (ref 5–15)
BUN: 14 mg/dL (ref 8–23)
CO2: 27 mmol/L (ref 22–32)
Calcium: 9.6 mg/dL (ref 8.9–10.3)
Chloride: 102 mmol/L (ref 98–111)
Creatinine, Ser: 5.91 mg/dL — ABNORMAL HIGH (ref 0.44–1.00)
GFR, Estimated: 7 mL/min — ABNORMAL LOW (ref >60)
Glucose, Bld: 83 mg/dL (ref 70–99)
Potassium: 3.6 mmol/L (ref 3.5–5.1)
Sodium: 141 mmol/L (ref 135–145)
Total Bilirubin: 0.8 mg/dL (ref 0.3–1.2)
Total Protein: 6.2 g/dL — ABNORMAL LOW (ref 6.5–8.1)

## 2022-07-26 LAB — URINALYSIS, ROUTINE W REFLEX MICROSCOPIC
Bilirubin Urine: NEGATIVE
Glucose, UA: 100 mg/dL — AB
Ketones, ur: NEGATIVE mg/dL
Leukocytes,Ua: NEGATIVE
Nitrite: NEGATIVE
Protein, ur: 100 mg/dL — AB
Specific Gravity, Urine: 1.015 (ref 1.005–1.030)
pH: 8.5 — ABNORMAL HIGH (ref 5.0–8.0)

## 2022-07-26 LAB — CBC
HCT: 35.1 % — ABNORMAL LOW (ref 36.0–46.0)
Hemoglobin: 10.8 g/dL — ABNORMAL LOW (ref 12.0–15.0)
MCH: 31.9 pg (ref 26.0–34.0)
MCHC: 30.8 g/dL (ref 30.0–36.0)
MCV: 103.5 fL — ABNORMAL HIGH (ref 80.0–100.0)
Platelets: 166 10*3/uL (ref 150–400)
RBC: 3.39 MIL/uL — ABNORMAL LOW (ref 3.87–5.11)
RDW: 18.9 % — ABNORMAL HIGH (ref 11.5–15.5)
WBC: 4.2 10*3/uL (ref 4.0–10.5)
nRBC: 0 % (ref 0.0–0.2)

## 2022-07-26 LAB — URINALYSIS, MICROSCOPIC (REFLEX)

## 2022-07-26 LAB — SEDIMENTATION RATE: Sed Rate: 32 mm/hr — ABNORMAL HIGH (ref 0–22)

## 2022-07-26 LAB — LIPASE, BLOOD: Lipase: 40 U/L (ref 11–51)

## 2022-07-26 LAB — C-REACTIVE PROTEIN: CRP: 5 mg/dL — ABNORMAL HIGH (ref <1.0)

## 2022-07-26 MED ORDER — CYCLOBENZAPRINE HCL 5 MG PO TABS
2.5000 mg | ORAL_TABLET | Freq: Every day | ORAL | Status: DC | PRN
Start: 2022-07-26 — End: 2022-07-30

## 2022-07-26 MED ORDER — CALCIUM CARBONATE ANTACID 1250 MG/5ML PO SUSP
500.0000 mg | Freq: Four times a day (QID) | ORAL | Status: DC | PRN
Start: 2022-07-26 — End: 2022-07-30
  Filled 2022-07-26: qty 5

## 2022-07-26 MED ORDER — ONDANSETRON HCL 4 MG PO TABS: 4.0000 mg | ORAL_TABLET | Freq: Four times a day (QID) | ORAL | Status: DC | PRN

## 2022-07-26 MED ORDER — METOPROLOL TARTRATE 50 MG PO TABS
50.0000 mg | ORAL_TABLET | Freq: Two times a day (BID) | ORAL | Status: DC
  Administered 2022-07-26 – 2022-07-29 (×6): 50 mg via ORAL
  Filled 2022-07-26 (×6): qty 1

## 2022-07-26 MED ORDER — VANCOMYCIN HCL 1500 MG/300ML IV SOLN
1500.0000 mg | Freq: Once | INTRAVENOUS | Status: DC
  Filled 2022-07-26: qty 300

## 2022-07-26 MED ORDER — METOPROLOL TARTRATE 25 MG PO TABS
50.0000 mg | ORAL_TABLET | Freq: Once | ORAL | Status: AC
  Administered 2022-07-26: 50 mg via ORAL
  Filled 2022-07-26: qty 2

## 2022-07-26 MED ORDER — MOMETASONE FURO-FORMOTEROL FUM 200-5 MCG/ACT IN AERO
2.0000 | INHALATION_SPRAY | Freq: Two times a day (BID) | RESPIRATORY_TRACT | Status: DC
  Administered 2022-07-27 – 2022-07-28 (×3): 2 via RESPIRATORY_TRACT
  Filled 2022-07-26: qty 8.8

## 2022-07-26 MED ORDER — NEPRO/CARBSTEADY PO LIQD
237.0000 mL | Freq: Three times a day (TID) | ORAL | Status: DC | PRN
  Filled 2022-07-26: qty 237

## 2022-07-26 MED ORDER — DOCUSATE SODIUM 283 MG RE ENEM
1.0000 | ENEMA | RECTAL | Status: DC | PRN
  Filled 2022-07-26: qty 1

## 2022-07-26 MED ORDER — CHLORHEXIDINE GLUCONATE CLOTH 2 % EX PADS
6.0000 | MEDICATED_PAD | Freq: Every day | CUTANEOUS | Status: DC
  Administered 2022-07-28: 6 via TOPICAL

## 2022-07-26 MED ORDER — LORAZEPAM 2 MG/ML IJ SOLN
0.5000 mg | Freq: Once | INTRAMUSCULAR | Status: DC
  Filled 2022-07-26 (×2): qty 1

## 2022-07-26 MED ORDER — SEVELAMER CARBONATE 800 MG PO TABS
1600.0000 mg | ORAL_TABLET | Freq: Three times a day (TID) | ORAL | Status: DC
  Administered 2022-07-27 – 2022-07-29 (×7): 1600 mg via ORAL
  Filled 2022-07-26 (×7): qty 2

## 2022-07-26 MED ORDER — LORAZEPAM 1 MG PO TABS
0.5000 mg | ORAL_TABLET | Freq: Once | ORAL | Status: AC
  Administered 2022-07-26: 0.5 mg via ORAL
  Filled 2022-07-26: qty 1

## 2022-07-26 MED ORDER — VANCOMYCIN HCL 1500 MG/300ML IV SOLN
1500.0000 mg | Freq: Once | INTRAVENOUS | Status: AC
  Administered 2022-07-26: 1500 mg via INTRAVENOUS
  Filled 2022-07-26: qty 300

## 2022-07-26 MED ORDER — ACETAMINOPHEN 650 MG RE SUPP: 650.0000 mg | Freq: Four times a day (QID) | RECTAL | Status: DC | PRN

## 2022-07-26 MED ORDER — APIXABAN 2.5 MG PO TABS
2.5000 mg | ORAL_TABLET | Freq: Two times a day (BID) | ORAL | Status: DC
  Administered 2022-07-26 – 2022-07-29 (×5): 2.5 mg via ORAL
  Filled 2022-07-26 (×6): qty 1

## 2022-07-26 MED ORDER — HYDRALAZINE HCL 20 MG/ML IJ SOLN
5.0000 mg | INTRAMUSCULAR | Status: DC | PRN
  Administered 2022-07-26: 5 mg via INTRAVENOUS
  Filled 2022-07-26: qty 1

## 2022-07-26 MED ORDER — SORBITOL 70 % SOLN
30.0000 mL | Status: DC | PRN
  Filled 2022-07-26: qty 30

## 2022-07-26 MED ORDER — PANTOPRAZOLE SODIUM 40 MG PO TBEC
40.0000 mg | DELAYED_RELEASE_TABLET | Freq: Two times a day (BID) | ORAL | Status: DC
  Administered 2022-07-26 – 2022-07-29 (×6): 40 mg via ORAL
  Filled 2022-07-26 (×6): qty 1

## 2022-07-26 MED ORDER — HYDRALAZINE HCL 20 MG/ML IJ SOLN
10.0000 mg | INTRAMUSCULAR | Status: DC | PRN
  Administered 2022-07-27 – 2022-07-29 (×4): 10 mg via INTRAVENOUS
  Filled 2022-07-26 (×4): qty 1

## 2022-07-26 MED ORDER — CAMPHOR-MENTHOL 0.5-0.5 % EX LOTN
1.0000 | TOPICAL_LOTION | Freq: Three times a day (TID) | CUTANEOUS | Status: DC | PRN
  Filled 2022-07-26: qty 222

## 2022-07-26 MED ORDER — ALBUTEROL SULFATE (2.5 MG/3ML) 0.083% IN NEBU: 3.0000 mL | INHALATION_SOLUTION | RESPIRATORY_TRACT | Status: DC | PRN

## 2022-07-26 MED ORDER — ACETAMINOPHEN 325 MG PO TABS
650.0000 mg | ORAL_TABLET | Freq: Four times a day (QID) | ORAL | Status: DC | PRN
  Administered 2022-07-26: 650 mg via ORAL
  Filled 2022-07-26: qty 2

## 2022-07-26 MED ORDER — CEFEPIME HCL 2 G IV SOLR
2.0000 g | Freq: Once | INTRAVENOUS | Status: AC
  Administered 2022-07-26: 2 g via INTRAVENOUS
  Filled 2022-07-26: qty 12.5

## 2022-07-26 MED ORDER — OXYCODONE HCL 5 MG PO TABS
2.5000 mg | ORAL_TABLET | Freq: Once | ORAL | Status: AC
  Administered 2022-07-26: 2.5 mg via ORAL
  Filled 2022-07-26: qty 1

## 2022-07-26 MED ORDER — LABETALOL HCL 5 MG/ML IV SOLN
10.0000 mg | Freq: Once | INTRAVENOUS | Status: AC
  Administered 2022-07-26: 10 mg via INTRAVENOUS
  Filled 2022-07-26: qty 4

## 2022-07-26 MED ORDER — ONDANSETRON HCL 4 MG/2ML IJ SOLN: 4.0000 mg | Freq: Four times a day (QID) | INTRAMUSCULAR | Status: DC | PRN

## 2022-07-26 MED ORDER — OXYCODONE HCL 5 MG PO TABS
5.0000 mg | ORAL_TABLET | Freq: Two times a day (BID) | ORAL | Status: DC | PRN
  Administered 2022-07-27: 5 mg via ORAL
  Filled 2022-07-26: qty 1

## 2022-07-26 MED ORDER — LIDOCAINE 5 % EX PTCH
1.0000 | MEDICATED_PATCH | Freq: Every day | CUTANEOUS | Status: DC | PRN
Start: 2022-07-26 — End: 2022-07-30

## 2022-07-26 MED ORDER — ZOLPIDEM TARTRATE 5 MG PO TABS: 5.0000 mg | ORAL_TABLET | Freq: Every evening | ORAL | Status: DC | PRN

## 2022-07-26 MED ORDER — HYDROXYZINE HCL 25 MG PO TABS: 25.0000 mg | ORAL_TABLET | Freq: Three times a day (TID) | ORAL | Status: DC | PRN

## 2022-07-26 NOTE — ED Provider Notes (Signed)
[facility] EMERGENCY DEPARTMENT Provider Note   CSN: [id number] Arrival date & time: 07/25/22  1518     History  Chief Complaint  Patient presents with   Back Pain    Diamond [name] is a 71 y.o. female.  The history is provided by the patient and medical records.  Back Pain Diamond [name] is a 71 y.o. female who presents to the Emergency Department complaining of back pain.  She presents to the emergency department for evaluation of acute on chronic back pain.  She states that she has off-and-on back pain for an unclear amount of time.  She states that she had a fall 3 days ago at home where she fell onto her bottom and has experienced worsening and persistent pain since that time.  Pain is described as in the lower back.  She states that she has been having problems with falling since she was recently discharged from the hospital for cholecystectomy.  She states that her legs frequently give out from underneath her and she feels weak in her legs.  No fevers, night sweats, chest pain, shortness of breath, nausea, vomiting, abdominal pain.  No loss of bowel or bladder.  No change in urine quality.  She has a complicated medical history including ESRD on hemodialysis Tuesday, Thursday, Saturday, MRSA bacteremia, lumbar discitis.  She normally ambulates with a wheelchair or walker at home.  She lives with her daughter and does have good support at home.     Home Medications Prior to Admission medications   Medication Sig Start Date End Date Taking? Authorizing Provider  acetaminophen (TYLENOL) 325 MG tablet Take 2 tablets (650 mg total) by mouth 4 (four) times daily. 06/30/22   Elgergawy, Silver Huguenin, MD  albuterol (VENTOLIN HFA) 108 (90 Base) MCG/ACT inhaler Inhale 2 puffs into the lungs every 4 (four) hours as needed for wheezing or shortness of breath. 10/08/19   [provider]  apixaban (ELIQUIS) 2.5 MG TABS tablet Take 1 tablet (2.5 mg total) by mouth 2 (two)  times daily. 02/06/22   Ghimire, Henreitta Leber, MD  cyclobenzaprine (FLEXERIL) 5 MG tablet Take 2.5 mg by mouth daily as needed for muscle spasms.    [provider]  docusate sodium (COLACE) 100 MG capsule Take 1 capsule (100 mg total) by mouth 2 (two) times daily. Patient not taking: Reported on 07/21/2022 06/30/22   Elgergawy, Silver Huguenin, MD  fluticasone-salmeterol (ADVAIR) 250-50 MCG/ACT AEPB Inhale 1 puff into the lungs 2 (two) times daily. 02/19/22   [provider]  Lidocaine 4 % PTCH Apply 1 patch topically daily as needed (pain).    Raymondo Band, MD  metoprolol tartrate (LOPRESSOR) 50 MG tablet Take 1 tablet (50 mg total) by mouth 2 (two) times daily. 12/30/21   Ghimire, Henreitta Leber, MD  ondansetron (ZOFRAN-ODT) 4 MG disintegrating tablet Take 1 tablet (4 mg total) by mouth every 8 (eight) hours as needed for nausea or vomiting. 06/08/22   White, Leitha Schuller, NP  oxyCODONE (ROXICODONE) 5 MG immediate release tablet Take 1 tablet (5 mg total) by mouth 2 (two) times daily as needed for up to 8 doses for severe pain or breakthrough pain. 07/07/22   Fransico Meadow, MD  pantoprazole (PROTONIX) 40 MG tablet Take 1 tablet (40 mg total) by mouth 2 (two) times daily. 01/22/22   Thurnell Lose, MD  sevelamer carbonate (RENVELA) 800 MG tablet Take 2 tablets (1,600 mg total) by mouth 3 (three) times daily  with meals. 12/30/21   Ghimire, Henreitta Leber, MD      Allergies    Shellfish allergy and Other    Review of Systems   Review of Systems  Musculoskeletal:  Positive for back pain.  All other systems reviewed and are negative.   Physical Exam Updated Vital Signs BP (!) 186/101   Pulse 89   Temp 98.4 F (36.9 C)   Resp 18   SpO2 100%  Physical Exam Vitals and nursing note reviewed.  Constitutional:      Appearance: She is well-developed.  HENT:     Head: Normocephalic and atraumatic.  Cardiovascular:     Rate and Rhythm: Normal rate and regular rhythm.     Heart sounds: No  murmur heard. Pulmonary:     Effort: Pulmonary effort is normal. No respiratory distress.     Breath sounds: Normal breath sounds.  Abdominal:     Palpations: Abdomen is soft.     Tenderness: There is no abdominal tenderness. There is no guarding or rebound.  Musculoskeletal:     Comments: No midline thoracic or lumbar tenderness to palpation.  There is paraspinous tenderness to palpation over the lower back bilaterally.  Skin:    General: Skin is warm and dry.  Neurological:     Mental Status: She is alert and oriented to person, place, and time.     Comments: 5 out of 5 strength in bilateral upper extremities.  She is unable to lift the right lower extremity off the stretcher but has 5 out of 5 strength in dorsiflexion and plantarflexion in the right lower extremity.  She has 4 out of 5 strength in the left lower extremity proximally with 5 out of 5 strength distally.  Sensation to light touch intact in all 4 extremities.  No saddle anesthesia.  Psychiatric:        Behavior: Behavior normal.     ED Results / Procedures / Treatments   Labs (all labs ordered are listed, but only abnormal results are displayed) Labs Reviewed  CBC  URINALYSIS, ROUTINE W REFLEX MICROSCOPIC  COMPREHENSIVE METABOLIC PANEL  LIPASE, BLOOD    EKG None  Radiology CT Lumbar Spine Wo Contrast  Result Date: 07/25/2022 CLINICAL DATA:  Low back pain starting 2 days ago EXAM: CT LUMBAR SPINE WITHOUT CONTRAST TECHNIQUE: Multidetector CT imaging of the lumbar spine was performed without intravenous contrast administration. Multiplanar CT image reconstructions were also generated. RADIATION DOSE REDUCTION: This exam was performed according to the departmental dose-optimization program which includes automated exposure control, adjustment of the mA and/or kV according to patient size and/or use of iterative reconstruction technique. COMPARISON:  CT abdomen/pelvis 07/27/2022, 06/12/2022, lumbar spine MRI 12/18/2021  FINDINGS: Segmentation: Transitional anatomy is again seen with sacralization of the L5 vertebral body. There is a rudimentary L5-S1 disc. This numbering convention is keeping with the prior lumbar spine MRI from January. Alignment: There is straightening of the normal lumbar lordosis. Grade 1 anterolisthesis of L3 on L4 is unchanged. Vertebrae: There is indentation of the superior L3 endplate by a prominent Schmorl's node, unchanged. There is compression deformity of the L3 vertebral body with up to approximately 25% loss of vertebral body height anteriorly. A fracture plane extends from the anterior endplate to the posterior endplate with involvement of the superior endplate with mild bony retropulsion resulting moderate severe spinal canal stenosis. The fracture was not present on the study from 01/16/2022 and was likely acute on the study from 06/12/2022 but has progressed since  that study. There is marked endplate irregularity along the L4-L5 disc space which is similar to the prior study and likely reflects sequela of discitis/osteomyelitis. The other vertebral body heights are preserved. Paraspinal and other soft tissues: Nonobstructing right renal stones are seen. Simple bilateral renal cysts are seen for which no specific imaging follow-up is required. There is no definite psoas abscess, within the confines of noncontrast technique. Disc levels: T12-L1 and L1-L2: No significant spinal canal or neural foraminal stenosis. L2-L3: Mild disc bulge without significant spinal canal or neural foraminal stenosis L2-L3: There is a disc bulge and mild bilateral facet arthropathy with superimposed bony retropulsion of the posterior L3 endplate resulting in moderate to severe spinal canal stenosis. No high-grade neural foraminal stenosis. L3-L4: There is grade 1 anterolisthesis with diffuse disc bulge and bilateral facet arthropathy with ligamentum flavum thickening resulting in moderate to severe spinal canal stenosis  and mild bilateral neural foraminal stenosis L4-L5: There is endplate spurring and bilateral facet arthropathy with ligamentum flavum thickening resulting in moderate to severe spinal canal stenosis and mild-to-moderate left and mild right neural foraminal stenosis. L5-S1: No significant spinal canal or neural foraminal stenosis. IMPRESSION: 1. Subacute compression fracture of the L3 vertebral body with a fracture plane extending from the anterior to the posterior endplate with involvement of the superior endplate. The fracture was likely acute on the study from 06/12/2022 and has progressed since that study. Mild bony retropulsion at this level results in moderate to severe spinal canal stenosis. 2. Endplate irregularity along the L4-L5 disc space likely reflects sequela of discitis/osteomyelitis, similar to the prior study. 3. Moderate to severe spinal canal stenosis at L3-L4 and L4-L5, similar to the prior MRI. Electronically Signed   By: Valetta Mole M.D.   On: 07/25/2022 19:30    Procedures Procedures    Medications Ordered in ED Medications  oxyCODONE (Oxy IR/ROXICODONE) immediate release tablet 2.5 mg (has no administration in time range)    ED Course/ Medical Decision Making/ A&P                           Medical Decision Making Amount and/or Complexity of Data Reviewed Radiology: ordered.  Risk Prescription drug management.   Patient with history of ESRD on hemodialysis, MRSA bacteremia and discitis, recent cholecystectomy here for evaluation of acute on chronic back pain in setting of recent fall.  CT scan is significant for subacute lumbar fracture, discussed this finding with patient.  She does have bilateral lower extremity weakness, right greater than left.  Given her history of discitis as well as subacute fracture feel she needs MRI to rule out additional pathology.  Patient care transferred pending MRI.       Final Clinical Impression(s) / ED Diagnoses Final diagnoses:   None    Rx / DC Orders ED Discharge Orders     None         Quintella Reichert, MD 07/26/22 [phone]

## 2022-07-26 NOTE — Consult Note (Signed)
Reason for Consult: Back pain Referring Physician: Medicine  [place] Diamond [name] is an 71 y.o. female.  HPI: 71 year old female with end-stage renal disease on hemodialysis with a recent history of MSSA bacteremia and likely discitis presents with worsening back pain.  Pain has been ongoing for the past 3 days.  Pain does not radiate.  Is not associated with any numbness or weakness.  Patient has completed her IV antibiotic course.  Recent sedimentation rate in the 30s suggests unlikely ongoing infection.  Patient states her pain is worsens with sitting upright or attempting to walk.  Past Medical History:  Diagnosis Date   Anemia    Asthma    Atrial flutter with rapid ventricular response ([place]) 12/25/2021   Chronic kidney disease    dialysis Tues Thurs Sat   Gout    Hypertension     Past Surgical History:  Procedure Laterality Date   AV FISTULA PLACEMENT Left 12/23/2021   Procedure: LEFT ARM BRACHIOBASILIC VEIN ARTERIOVENOUS (AV) FISTULA CREATION;  Surgeon: [name], MD;  Location: [facility];  Service: Vascular;  Laterality: Left;   [place] Left 03/08/2022   Procedure: LEFT SECOND STAGE BASILIC VEIN TRANSPOSITION;  Surgeon: [name], MD;  Location: [facility] OR;  Service: Vascular;  Laterality: Left;   BIOPSY  01/19/2022   Procedure: BIOPSY;  Surgeon: Lavena Bullion, DO;  Location: [facility];  Service: Gastroenterology;;   [place] N/A 06/25/2022   Procedure: OPEN CHOLECYSTECTOMY;  Surgeon: Dwan Bolt, MD;  Location: [place];  Service: General;  Laterality: N/A;   COLONOSCOPY Left 01/19/2022   Procedure: COLONOSCOPY;  Surgeon: Lavena Bullion, DO;  Location: [facility];  Service: Gastroenterology;  Laterality: Left;   ESOPHAGOGASTRODUODENOSCOPY Left 01/19/2022   Procedure: ESOPHAGOGASTRODUODENOSCOPY (EGD);  Surgeon: Lavena Bullion, DO;  Location: [facility]COPY;  Service: Gastroenterology;  Laterality: Left;   INSERTION OF  DIALYSIS CATHETER Right 12/23/2021   Procedure: INSERTION OF DIALYSIS CATHETER;  Surgeon: [name], MD;  Location: [place];  Service: Vascular;  Laterality: Right;   LAPAROSCOPY N/A 06/25/2022   Procedure: STAGING LAPAROSCOPY;  Surgeon: Dwan Bolt, MD;  Location: [place];  Service: General;  Laterality: N/A;   OPEN PARTIAL HEPATECTOMY  N/A 06/25/2022   Procedure: OPEN PARTIAL HEPATECTOMY;  Surgeon: Dwan Bolt, MD;  Location: [place];  Service: General;  Laterality: N/A;   POLYPECTOMY  01/19/2022   Procedure: POLYPECTOMY;  Surgeon: Lavena Bullion, DO;  Location: [facility]OSCOPY;  Service: Gastroenterology;;   TEE WITHOUT CARDIOVERSION N/A 12/22/2021   Procedure: TRANSESOPHAGEAL ECHOCARDIOGRAM (TEE);  Surgeon: Jerline Pain, MD;  Location: [facility] ENDOSCOPY;  Service: Cardiovascular;  Laterality: N/A;   TEE WITHOUT CARDIOVERSION N/A 06/17/2022   Procedure: TRANSESOPHAGEAL ECHOCARDIOGRAM (TEE);  Surgeon: Buford Dresser, MD;  Location: [facility] ENDOSCOPY;  Service: Cardiovascular;  Laterality: N/A;   ULTRASOUND GUIDANCE FOR VASCULAR ACCESS  12/23/2021   Procedure: ULTRASOUND GUIDANCE FOR VASCULAR ACCESS;  Surgeon: [name], MD;  Location: [facility] OR;  Service: Vascular;;    Family History  Problem Relation Age of Onset   Hypertension Mother    Hypertension Father    Colon cancer Brother    Lung cancer Brother     Social History:  reports that she has never smoked. She has never been exposed to tobacco smoke. She has never used smokeless tobacco. She reports that she does not drink alcohol and does not use drugs.  Allergies:  Allergies  Allergen  Reactions   Shellfish Allergy     Unknown reaction    Other Rash    Pt states anesthesia makes her brake out in rash    Medications: I have reviewed the patient's current medications.  Results for orders placed or performed during the hospital encounter of 07/25/22 (from the past 48 hour(s))  CBC     Status: Abnormal   Collection  Time: 07/26/22  4:07 AM  Result Value Ref Range   WBC 4.2 4.0 - 10.5 K/uL   RBC 3.39 (L) 3.87 - 5.11 MIL/uL   Hemoglobin 10.8 (L) 12.0 - 15.0 g/dL   HCT 35.1 (L) 36.0 - 46.0 %   MCV 103.5 (H) 80.0 - 100.0 fL   MCH 31.9 26.0 - 34.0 pg   MCHC 30.8 30.0 - 36.0 g/dL   RDW 18.9 (H) 11.5 - 15.5 %   Platelets 166 150 - 400 K/uL   nRBC 0.0 0.0 - 0.2 %    Comment: Performed at [facility], [address] [place], [place] [address]  Urinalysis, Routine w reflex microscopic     Status: Abnormal   Collection Time: 07/26/22  4:07 AM  Result Value Ref Range   Color, Urine YELLOW YELLOW   APPearance CLEAR CLEAR   Specific Gravity, Urine 1.015 1.005 - 1.030   pH 8.5 (H) 5.0 - 8.0   Glucose, UA 100 (A) NEGATIVE mg/dL   Hgb urine dipstick TRACE (A) NEGATIVE   Bilirubin Urine NEGATIVE NEGATIVE   Ketones, ur NEGATIVE NEGATIVE mg/dL   Protein, ur 100 (A) NEGATIVE mg/dL   Nitrite NEGATIVE NEGATIVE   Leukocytes,Ua NEGATIVE NEGATIVE    Comment: Performed at [facility] [address] [place], [place] [address]  Urinalysis, Microscopic (reflex)     Status: Abnormal   Collection Time: 07/26/22  4:07 AM  Result Value Ref Range   RBC / HPF 0-5 0 - 5 RBC/hpf   WBC, UA 0-5 0 - 5 WBC/hpf   Bacteria, UA FEW (A) NONE SEEN   Squamous Epithelial / LPF 0-5 0 - 5    Comment: Performed at [facility], [place] [address] [place], [place] [address]  Comprehensive metabolic panel     Status: Abnormal   Collection Time: 07/26/22  4:42 AM  Result Value Ref Range   Sodium 141 135 - 145 mmol/L   Potassium 3.6 3.5 - 5.1 mmol/L   Chloride 102 98 - 111 mmol/L   CO2 27 22 - 32 mmol/L   Glucose, Bld 83 70 - 99 mg/dL    Comment: Glucose reference range applies only to samples taken after fasting for at least 8 hours.   BUN 14 8 - 23 mg/dL   Creatinine, Ser 5.91 (H) 0.44 - 1.00 mg/dL   Calcium 9.6 8.9 - 10.3 mg/dL   Total Protein 6.2 (L) 6.5 - 8.1 g/dL   Albumin 2.7 (L) 3.5 - 5.0 g/dL   AST 13 (L)  15 - 41 U/L   ALT 6 0 - 44 U/L   Alkaline Phosphatase 54 38 - 126 U/L   Total Bilirubin 0.8 0.3 - 1.2 mg/dL   GFR, Estimated 7 (L) >60 mL/min    Comment: (NOTE) Calculated using the CKD-EPI Creatinine Equation (2021)    Anion gap 12 5 - 15    Comment: Performed at [facility], [place] [address] [place], [place] [address]  Lipase, blood     Status: None   Collection Time: 07/26/22  4:42 AM  Result Value Ref Range   Lipase 40 11 - 51 U/L    Comment: Performed at [facility], [place] [address] [place], [place] [address]  Sedimentation rate     Status: Abnormal   Collection Time: 07/26/22 12:55 PM  Result Value Ref Range   Sed Rate 32 (H) 0 - 22 mm/hr    Comment: Performed at [facility] [address], [place], [place] [address]  C-reactive protein     Status: Abnormal   Collection Time: 07/26/22 12:55 PM  Result Value Ref Range   CRP 5.0 (H) <1.0 mg/dL    Comment: Performed at [facility], [place] [address], [place],  [address]    MR LUMBAR SPINE WO CONTRAST  Result Date: 07/26/2022 CLINICAL DATA:  Low back pain, infection suspected, positive xray/CT acute on chronic back pain, recent fall, hx/o discitis not on abx now with BLE weakness, right greater than left EXAM: MRI LUMBAR SPINE WITHOUT CONTRAST TECHNIQUE: Multiplanar, multisequence MR imaging of the lumbar spine was performed. No intravenous contrast was administered. COMPARISON:  CT of the lumbar spine 07/25/2022. MRI of the lumbar spine 12/18/2021. FINDINGS: Segmentation: Transitional lumbosacral anatomy with sacralized L5 segment and rudimentary L5-S1 disc. Alignment:  Similar grade 1 anterolisthesis at L3-L4. Vertebrae: Fracture of the L3 superior endplate better characterized on recent CT. There is associated bone marrow edema, compatible with recent/healing fracture. There is edema within the adjacent L2-L3 disc. Improved L4-L5 edema with surrounding bone marrow edema. Conus medullaris and cauda  equina: Conus extends to the L1 level. Conus appears normal. Paraspinal and other soft tissues: Paravertebral edema centered at L2-L3 including anterior paraspinal soft tissue thickening and edema. No discrete, drainable fluid collection; however, the absence of contrast limits assessment. Many cysts within both kidneys. Disc levels: Similar multilevel degenerative change, including moderate to severe canal stenosis L3-L4 and L4-L5 with areas of subarticular recess stenosis. Similar multilevel foraminal narrowing, which appears greatest on the left at L4-L5. IMPRESSION: 1. Transitional lumbosacral anatomy with sacralized L5 segment and rudimentary L5-S1 disc. Correlation with radiographs is recommended prior to any operative intervention. 2. Recent/healing L3 fracture as described on recent CT with edema in the L2-L3 disc and superimposed surrounding paravertebral edema, which is nonspecific but raises concern for superimposed discitis with surrounding phlegmon/infection (particularly given below finding and reported clinical concern). Posttraumatic inflammatory change/edema is a consideration but thought less likely. No discrete, drainable fluid collection identified; however, the absence of contrast limits assessment. If the patient is able, recommend post-contrast imaging. 3. Improved L4-L5 disc and surrounding bone marrow edema, possibly representing improving discitis/osteomyelitis at this level. Degenerative change is a differential consideration. 4. Similar superimposed multilevel degenerative change, including moderate to severe canal stenosis at L3-L4 and L4-L5. Electronically Signed   By: Margaretha Sheffield M.D.   On: 07/26/2022 10:21   MR THORACIC SPINE WO CONTRAST  Result Date: 07/26/2022 CLINICAL DATA:  Mid-back pain, neuro deficit EXAM: MRI THORACIC SPINE WITHOUT CONTRAST TECHNIQUE: Multiplanar, multisequence MR imaging of the thoracic spine was performed. No intravenous contrast was administered.  COMPARISON:  MRI thoracic spine 12/18/2021. FINDINGS: Alignment: Mildly exaggerated thoracic kyphosis. No substantial sagittal subluxation. Vertebrae: Mildly heterogeneous bone marrow without suspicious bone lesion. Scattered benign vertebral venous malformations. Endplate signal changes at multiple levels are most characteristic of degenerative/discogenic endplate signal change. Otherwise, no marrow edema to suggest acute fracture or discitis/osteomyelitis. Cord:  Normal cord signal. Paraspinal and other soft tissues: Partially imaged left renal  cysts. Disc levels: Small disc bulges at multiple levels without significant canal stenosis. No significant foraminal stenosis. IMPRESSION: No evidence of acute abnormality or significant canal/foraminal stenosis. Electronically Signed   By: Margaretha Sheffield M.D.   On: 07/26/2022 09:53   CT Lumbar Spine Wo Contrast  Result Date: 07/25/2022 CLINICAL DATA:  Low back pain starting 2 days ago EXAM: CT LUMBAR SPINE WITHOUT CONTRAST TECHNIQUE: Multidetector CT imaging of the lumbar spine was performed without intravenous contrast administration. Multiplanar CT image reconstructions were also generated. RADIATION DOSE REDUCTION: This exam was performed according to the departmental dose-optimization program which includes automated exposure control, adjustment of the mA and/or kV according to patient size and/or use of iterative reconstruction technique. COMPARISON:  CT abdomen/pelvis 07/27/2022, 06/12/2022, lumbar spine MRI 12/18/2021 FINDINGS: Segmentation: Transitional anatomy is again seen with sacralization of the L5 vertebral body. There is a rudimentary L5-S1 disc. This numbering convention is keeping with the prior lumbar spine MRI from January. Alignment: There is straightening of the normal lumbar lordosis. Grade 1 anterolisthesis of L3 on L4 is unchanged. Vertebrae: There is indentation of the superior L3 endplate by a prominent Schmorl's node, unchanged. There is  compression deformity of the L3 vertebral body with up to approximately 25% loss of vertebral body height anteriorly. A fracture plane extends from the anterior endplate to the posterior endplate with involvement of the superior endplate with mild bony retropulsion resulting moderate severe spinal canal stenosis. The fracture was not present on the study from 01/16/2022 and was likely acute on the study from 06/12/2022 but has progressed since that study. There is marked endplate irregularity along the L4-L5 disc space which is similar to the prior study and likely reflects sequela of discitis/osteomyelitis. The other vertebral body heights are preserved. Paraspinal and other soft tissues: Nonobstructing right renal stones are seen. Simple bilateral renal cysts are seen for which no specific imaging follow-up is required. There is no definite psoas abscess, within the confines of noncontrast technique. Disc levels: T12-L1 and L1-L2: No significant spinal canal or neural foraminal stenosis. L2-L3: Mild disc bulge without significant spinal canal or neural foraminal stenosis L2-L3: There is a disc bulge and mild bilateral facet arthropathy with superimposed bony retropulsion of the posterior L3 endplate resulting in moderate to severe spinal canal stenosis. No high-grade neural foraminal stenosis. L3-L4: There is grade 1 anterolisthesis with diffuse disc bulge and bilateral facet arthropathy with ligamentum flavum thickening resulting in moderate to severe spinal canal stenosis and mild bilateral neural foraminal stenosis L4-L5: There is endplate spurring and bilateral facet arthropathy with ligamentum flavum thickening resulting in moderate to severe spinal canal stenosis and mild-to-moderate left and mild right neural foraminal stenosis. L5-S1: No significant spinal canal or neural foraminal stenosis. IMPRESSION: 1. Subacute compression fracture of the L3 vertebral body with a fracture plane extending from the  anterior to the posterior endplate with involvement of the superior endplate. The fracture was likely acute on the study from 06/12/2022 and has progressed since that study. Mild bony retropulsion at this level results in moderate to severe spinal canal stenosis. 2. Endplate irregularity along the L4-L5 disc space likely reflects sequela of discitis/osteomyelitis, similar to the prior study. 3. Moderate to severe spinal canal stenosis at L3-L4 and L4-L5, similar to the prior MRI. Electronically Signed   By: Valetta Mole M.D.   On: 07/25/2022 19:30    Pertinent items noted in HPI and remainder of comprehensive ROS otherwise negative. Blood pressure (!) 188/110, pulse 69, temperature 98.3 F (  36.8 C), resp. rate 18, SpO2 100 %. Patient is awake and alert.  She is oriented and appropriate.  She appears completely comfortable and in no distress at present.  Cranial nerve function normal bilateral.  Motor and sensory function in both upper and lower extremities are intact.  Reflexes are normal active except her Achilles reflexes are absent bilaterally.  Straight raising is equivocal bilaterally.  Abdomen soft.  Chest benign.  Assessment/Plan: History of MSSA discitis status post treatment.  Patient with likely subacute L3 compression fracture.  Patient with significant stenosis at her lumbosacral junction which is chronic and seems to be essentially asymptomatic at present.  Recommend bracing and mobilization with therapy.  Should discuss with ID with regard to resumption of antibiotics but at this point I think discitis is less of a concern.  Zemanek Render Bartley Vuolo 07/26/2022, 5:31 PM

## 2022-07-26 NOTE — ED Provider Notes (Signed)
Patient was signed out to me at 0700 by Dr. Ralene Bathe pending MRI.  In short, this is a 71 year old female who presented to the emergency department with low back pain.  She has a history of ESRD and had recent cholecystectomy and MSSA bacteremia, completing antibiotics approximately 2 weeks ago.  Upon my evaluation, the patient was asleep in bed resting comfortably in no acute distress.  She states that her back pain significantly improved after a dose of oxycodone last night.  On exam, she has no midline neck or back tenderness, sensation is intact in bilateral lower extremities and strength has improved to 5 out of 5.  MRI will be performed due to her significant history and she will be reassessed.   Ottie Glazier, DO 07/26/22 1531

## 2022-07-26 NOTE — ED Notes (Signed)
Patient is requesting something to eat. Advised that the patient will have to wait   the results of her MRI

## 2022-07-26 NOTE — ED Notes (Signed)
Patient transported to MRI 

## 2022-07-26 NOTE — ED Notes (Signed)
Alante Weimann [phone] wants to speak to patient

## 2022-07-26 NOTE — ED Notes (Addendum)
Pt states that she has been having lower back pain for the past two days ago.  She states its worse on the left side.  Oxycodone given by previous RN stated has helped with the pain.  Pt currently alert and resting.  Warm blankets given.  Awaiting MRI for further imaging.

## 2022-07-26 NOTE — ED Notes (Signed)
IP RN accepted report, patient is ready to move. Transport requested.

## 2022-07-26 NOTE — H&P (Signed)
History and Physical    Patient: Diamond [name] LNL:[id number] DOB: 08-22-1951 DOA: 07/25/2022 DOS: the patient was seen and examined on 07/26/2022 PCP: [name], MD  Patient coming from: Home - lives with daughter; Donald Prose: Jaden, [place], [phone]   Chief Complaint: back pain  HPI: AYANNAH FADDIS is a 71 y.o. female with medical history significant of aflutter; ESRD on TTS HD; and HTN presenting with back pain.  She was previously hospitalized from 7/8-26 with MRSA bacteremia thought to be 2* to HD catheter.  She was treated with IV Vanc at HD through 8/8.  She had prior h/o L5-S1 discitis with MRSA bacteremia and endocarditis in 12/2021 that was treated with 8 weeks of IV Vanc.   She reports that she has had ongoing mild intermittent back pain but it acutely worsened a few days ago.  She is currently sleepy after Ativan for MRI and cannot provide significant further history.  She presented overnight and was held pending MRI.      ER Course:  h/o bacteremia and discitis.  Here with back pain, MRI positive for discitis with phlegmon.  Neurosurgery recommended admission with IV antibiotics.       Review of Systems: As mentioned in the history of present illness. All other systems reviewed and are negative.  Limited by sedation from MRI.   Past Medical History:  Diagnosis Date   Anemia    Asthma    Atrial flutter with rapid ventricular response ([place]) 12/25/2021   Chronic kidney disease    dialysis Tues Thurs Sat   Gout    Hypertension    Past Surgical History:  Procedure Laterality Date   AV FISTULA PLACEMENT Left 12/23/2021   Procedure: LEFT ARM BRACHIOBASILIC VEIN ARTERIOVENOUS (AV) FISTULA CREATION;  Surgeon: [name], MD;  Location: [place];  Service: Vascular;  Laterality: Left;   [place] Left 03/08/2022   Procedure: LEFT SECOND STAGE BASILIC VEIN TRANSPOSITION;  Surgeon: [name], MD;  Location: [facility] OR;  Service: Vascular;   Laterality: Left;   BIOPSY  01/19/2022   Procedure: BIOPSY;  Surgeon: Lavena Bullion, DO;  Location: [place] ENDOSCOPY;  Service: Gastroenterology;;   [place] N/A 06/25/2022   Procedure: OPEN CHOLECYSTECTOMY;  Surgeon: Dwan Bolt, MD;  Location: [place];  Service: General;  Laterality: N/A;   COLONOSCOPY Left 01/19/2022   Procedure: COLONOSCOPY;  Surgeon: Lavena Bullion, DO;  Location: [place];  Service: Gastroenterology;  Laterality: Left;   ESOPHAGOGASTRODUODENOSCOPY Left 01/19/2022   Procedure: ESOPHAGOGASTRODUODENOSCOPY (EGD);  Surgeon: Lavena Bullion, DO;  Location: [facility] ENDOSCOPY;  Service: Gastroenterology;  Laterality: Left;   INSERTION OF DIALYSIS CATHETER Right 12/23/2021   Procedure: INSERTION OF DIALYSIS CATHETER;  Surgeon: [name], MD;  Location: [place];  Service: Vascular;  Laterality: Right;   LAPAROSCOPY N/A 06/25/2022   Procedure: STAGING LAPAROSCOPY;  Surgeon: Dwan Bolt, MD;  Location: [place];  Service: General;  Laterality: N/A;   OPEN PARTIAL HEPATECTOMY  N/A 06/25/2022   Procedure: OPEN PARTIAL HEPATECTOMY;  Surgeon: Dwan Bolt, MD;  Location: [place];  Service: General;  Laterality: N/A;   POLYPECTOMY  01/19/2022   Procedure: POLYPECTOMY;  Surgeon: Lavena Bullion, DO;  Location: [place] [facility]OSCOPY;  Service: Gastroenterology;;   TEE WITHOUT CARDIOVERSION N/A 12/22/2021   Procedure: TRANSESOPHAGEAL ECHOCARDIOGRAM (TEE);  Surgeon: Jerline Pain, MD;  Location: [facility] ENDOSCOPY;  Service: Cardiovascular;  Laterality: N/A;   TEE WITHOUT CARDIOVERSION  N/A 06/17/2022   Procedure: TRANSESOPHAGEAL ECHOCARDIOGRAM (TEE);  Surgeon: Buford Dresser, MD;  Location: [place];  Service: Cardiovascular;  Laterality: N/A;   ULTRASOUND GUIDANCE FOR VASCULAR ACCESS  12/23/2021   Procedure: ULTRASOUND GUIDANCE FOR VASCULAR ACCESS;  Surgeon: [name], MD;  Location: [place];  Service: Vascular;;   Social History:  reports that  she has never smoked. She has never been exposed to tobacco smoke. She has never used smokeless tobacco. She reports that she does not drink alcohol and does not use drugs.  Allergies  Allergen Reactions   Shellfish Allergy     Unknown reaction    Other Rash    Pt states anesthesia makes her brake out in rash    Family History  Problem Relation Age of Onset   Hypertension Mother    Hypertension Father    Colon cancer Brother    Lung cancer Brother     Prior to Admission medications   Medication Sig Start Date End Date Taking? Authorizing Provider  acetaminophen (TYLENOL) 325 MG tablet Take 2 tablets (650 mg total) by mouth 4 (four) times daily. 06/30/22   Elgergawy, Silver Huguenin, MD  albuterol (VENTOLIN HFA) 108 (90 Base) MCG/ACT inhaler Inhale 2 puffs into the lungs every 4 (four) hours as needed for wheezing or shortness of breath. 10/08/19   [provider]  apixaban (ELIQUIS) 2.5 MG TABS tablet Take 1 tablet (2.5 mg total) by mouth 2 (two) times daily. 02/06/22   Ghimire, Henreitta Leber, MD  cyclobenzaprine (FLEXERIL) 5 MG tablet Take 2.5 mg by mouth daily as needed for muscle spasms.    [provider]  docusate sodium (COLACE) 100 MG capsule Take 1 capsule (100 mg total) by mouth 2 (two) times daily. Patient not taking: Reported on 07/21/2022 06/30/22   Elgergawy, Silver Huguenin, MD  fluticasone-salmeterol (ADVAIR) 250-50 MCG/ACT AEPB Inhale 1 puff into the lungs 2 (two) times daily. 02/19/22   [provider]  Lidocaine 4 % PTCH Apply 1 patch topically daily as needed (pain).    Raymondo Band, MD  metoprolol tartrate (LOPRESSOR) 50 MG tablet Take 1 tablet (50 mg total) by mouth 2 (two) times daily. 12/30/21   Ghimire, Henreitta Leber, MD  ondansetron (ZOFRAN-ODT) 4 MG disintegrating tablet Take 1 tablet (4 mg total) by mouth every 8 (eight) hours as needed for nausea or vomiting. 06/08/22   White, Leitha Schuller, NP  oxyCODONE (ROXICODONE) 5 MG immediate release tablet Take 1 tablet  (5 mg total) by mouth 2 (two) times daily as needed for up to 8 doses for severe pain or breakthrough pain. 07/07/22   Fransico Meadow, MD  pantoprazole (PROTONIX) 40 MG tablet Take 1 tablet (40 mg total) by mouth 2 (two) times daily. 01/22/22   Thurnell Lose, MD  sevelamer carbonate (RENVELA) 800 MG tablet Take 2 tablets (1,600 mg total) by mouth 3 (three) times daily with meals. 12/30/21   Jonetta Osgood, MD    Physical Exam: Vitals:   07/26/22 0730 07/26/22 0731 07/26/22 1013 07/26/22 1412  BP: (!) 174/92  (!) 191/128 (!) 188/110  Pulse: 69  70 69  Resp: '14  18 18  ' Temp: 97.8 F (36.6 C)  98.4 F (36.9 C) 98.3 F (36.8 C)  TempSrc: Oral  Oral   SpO2: 99% 99% 99% 100%   General:  Appears calm and comfortable and is in NAD Eyes:   EOMI, normal lids, iris ENT:  grossly normal hearing, lips & tongue, mmm  Neck:  no LAD, masses or thyromegaly Cardiovascular:  RRR, no m/r/g. No LE edema.  Respiratory:   CTA bilaterally with no wheezes/rales/rhonchi.  Normal respiratory effort. Abdomen:  soft, NT, ND Back:   normal alignment, no CVAT, +low back pain Skin:  no rash or induration seen on limited exam Musculoskeletal:  grossly normal tone BUE/BLE, good ROM, no bony abnormality Psychiatric:  mildly confused/somnolent mood and affect, speech fluent and appropriate, AOx3 Neurologic:  CN 2-12 grossly intact, moves all extremities in coordinated fashion   Radiological Exams on Admission: Independently reviewed - see discussion in A/P where applicable  MR LUMBAR SPINE WO CONTRAST  Result Date: 07/26/2022 CLINICAL DATA:  Low back pain, infection suspected, positive xray/CT acute on chronic back pain, recent fall, hx/o discitis not on abx now with BLE weakness, right greater than left EXAM: MRI LUMBAR SPINE WITHOUT CONTRAST TECHNIQUE: Multiplanar, multisequence MR imaging of the lumbar spine was performed. No intravenous contrast was administered. COMPARISON:  CT of the lumbar spine  07/25/2022. MRI of the lumbar spine 12/18/2021. FINDINGS: Segmentation: Transitional lumbosacral anatomy with sacralized L5 segment and rudimentary L5-S1 disc. Alignment:  Similar grade 1 anterolisthesis at L3-L4. Vertebrae: Fracture of the L3 superior endplate better characterized on recent CT. There is associated bone marrow edema, compatible with recent/healing fracture. There is edema within the adjacent L2-L3 disc. Improved L4-L5 edema with surrounding bone marrow edema. Conus medullaris and cauda equina: Conus extends to the L1 level. Conus appears normal. Paraspinal and other soft tissues: Paravertebral edema centered at L2-L3 including anterior paraspinal soft tissue thickening and edema. No discrete, drainable fluid collection; however, the absence of contrast limits assessment. Many cysts within both kidneys. Disc levels: Similar multilevel degenerative change, including moderate to severe canal stenosis L3-L4 and L4-L5 with areas of subarticular recess stenosis. Similar multilevel foraminal narrowing, which appears greatest on the left at L4-L5. IMPRESSION: 1. Transitional lumbosacral anatomy with sacralized L5 segment and rudimentary L5-S1 disc. Correlation with radiographs is recommended prior to any operative intervention. 2. Recent/healing L3 fracture as described on recent CT with edema in the L2-L3 disc and superimposed surrounding paravertebral edema, which is nonspecific but raises concern for superimposed discitis with surrounding phlegmon/infection (particularly given below finding and reported clinical concern). Posttraumatic inflammatory change/edema is a consideration but thought less likely. No discrete, drainable fluid collection identified; however, the absence of contrast limits assessment. If the patient is able, recommend post-contrast imaging. 3. Improved L4-L5 disc and surrounding bone marrow edema, possibly representing improving discitis/osteomyelitis at this level. Degenerative  change is a differential consideration. 4. Similar superimposed multilevel degenerative change, including moderate to severe canal stenosis at L3-L4 and L4-L5. Electronically Signed   By: Margaretha Sheffield M.D.   On: 07/26/2022 10:21   MR THORACIC SPINE WO CONTRAST  Result Date: 07/26/2022 CLINICAL DATA:  Mid-back pain, neuro deficit EXAM: MRI THORACIC SPINE WITHOUT CONTRAST TECHNIQUE: Multiplanar, multisequence MR imaging of the thoracic spine was performed. No intravenous contrast was administered. COMPARISON:  MRI thoracic spine 12/18/2021. FINDINGS: Alignment: Mildly exaggerated thoracic kyphosis. No substantial sagittal subluxation. Vertebrae: Mildly heterogeneous bone marrow without suspicious bone lesion. Scattered benign vertebral venous malformations. Endplate signal changes at multiple levels are most characteristic of degenerative/discogenic endplate signal change. Otherwise, no marrow edema to suggest acute fracture or discitis/osteomyelitis. Cord:  Normal cord signal. Paraspinal and other soft tissues: Partially imaged left renal cysts. Disc levels: Small disc bulges at multiple levels without significant canal stenosis. No significant foraminal stenosis. IMPRESSION: No evidence of acute abnormality or  significant canal/foraminal stenosis. Electronically Signed   By: Margaretha Sheffield M.D.   On: 07/26/2022 09:53   CT Lumbar Spine Wo Contrast  Result Date: 07/25/2022 CLINICAL DATA:  Low back pain starting 2 days ago EXAM: CT LUMBAR SPINE WITHOUT CONTRAST TECHNIQUE: Multidetector CT imaging of the lumbar spine was performed without intravenous contrast administration. Multiplanar CT image reconstructions were also generated. RADIATION DOSE REDUCTION: This exam was performed according to the departmental dose-optimization program which includes automated exposure control, adjustment of the mA and/or kV according to patient size and/or use of iterative reconstruction technique. COMPARISON:  CT  abdomen/pelvis 07/27/2022, 06/12/2022, lumbar spine MRI 12/18/2021 FINDINGS: Segmentation: Transitional anatomy is again seen with sacralization of the L5 vertebral body. There is a rudimentary L5-S1 disc. This numbering convention is keeping with the prior lumbar spine MRI from January. Alignment: There is straightening of the normal lumbar lordosis. Grade 1 anterolisthesis of L3 on L4 is unchanged. Vertebrae: There is indentation of the superior L3 endplate by a prominent Schmorl's node, unchanged. There is compression deformity of the L3 vertebral body with up to approximately 25% loss of vertebral body height anteriorly. A fracture plane extends from the anterior endplate to the posterior endplate with involvement of the superior endplate with mild bony retropulsion resulting moderate severe spinal canal stenosis. The fracture was not present on the study from 01/16/2022 and was likely acute on the study from 06/12/2022 but has progressed since that study. There is marked endplate irregularity along the L4-L5 disc space which is similar to the prior study and likely reflects sequela of discitis/osteomyelitis. The other vertebral body heights are preserved. Paraspinal and other soft tissues: Nonobstructing right renal stones are seen. Simple bilateral renal cysts are seen for which no specific imaging follow-up is required. There is no definite psoas abscess, within the confines of noncontrast technique. Disc levels: T12-L1 and L1-L2: No significant spinal canal or neural foraminal stenosis. L2-L3: Mild disc bulge without significant spinal canal or neural foraminal stenosis L2-L3: There is a disc bulge and mild bilateral facet arthropathy with superimposed bony retropulsion of the posterior L3 endplate resulting in moderate to severe spinal canal stenosis. No high-grade neural foraminal stenosis. L3-L4: There is grade 1 anterolisthesis with diffuse disc bulge and bilateral facet arthropathy with ligamentum  flavum thickening resulting in moderate to severe spinal canal stenosis and mild bilateral neural foraminal stenosis L4-L5: There is endplate spurring and bilateral facet arthropathy with ligamentum flavum thickening resulting in moderate to severe spinal canal stenosis and mild-to-moderate left and mild right neural foraminal stenosis. L5-S1: No significant spinal canal or neural foraminal stenosis. IMPRESSION: 1. Subacute compression fracture of the L3 vertebral body with a fracture plane extending from the anterior to the posterior endplate with involvement of the superior endplate. The fracture was likely acute on the study from 06/12/2022 and has progressed since that study. Mild bony retropulsion at this level results in moderate to severe spinal canal stenosis. 2. Endplate irregularity along the L4-L5 disc space likely reflects sequela of discitis/osteomyelitis, similar to the prior study. 3. Moderate to severe spinal canal stenosis at L3-L4 and L4-L5, similar to the prior MRI. Electronically Signed   By: Valetta Mole M.D.   On: 07/25/2022 19:30    EKG: not done   Labs on Admission: I have personally reviewed the available labs and imaging studies at the time of the admission.  Pertinent labs:    BUN 14/Creatinine 5.91/GFR 7 Albumin 2.7 CRP 5 ESR 32 WBC 4.2 Hgb 10.8 -  stable UA: 100 glucose, trace Hgb, 100 protein, few bacteria   Assessment and Plan: Principal Problem:   Discitis of lumbosacral region Active Problems:   Hypertension   ESRD on hemodialysis (HCC)   Paroxysmal atrial flutter (HCC)   Asthma   Hepatitis C without hepatic coma    Discitis -Patient with h/o MRSA bacteremia with h/o endocarditis, L5/S1 discitis presenting with back pain. -She recently finished antibiotics on 8/8 -She was seen by ID on 8/16 and did not have symptoms at that time; ID will see patient tomorrow -MRI today showed recent/healing L3 fracutre with Le-3 disc edema concerning for discitis with  surrounding infection/ phlegmon  -Concerning lab findings would include CRP >13.5, elevated ESR (goal is < 1/2 age +45 if female); CRP and ESR are not currently concerning.  WBC is often <15 with discitis. -Neurosurgery consulted and will see her -Will need antibiotics for 4-6 weeks - currently on Cefepime/Vanc. -With vertebral osteo, needs testing for TB; will add quantiferon gold  -Further important considerations include nutrition (will order consult), diabetes control (no h/o, will check A1c) -Continue Oxy, flexeril, lidocaine patch as needed for pain  Aflutter -Rate controlled with metoprolol -Continue Eliquis  ESRD on TTS HD -Patient on chronic TTS HD -Now dialyzing via AV fistula; HD catheter was previously removed -Nephrology prn order set utilized -She does not appear to be volume overloaded or otherwise in need of acute HD -Nephrology is aware that patient will need HD  -Continue Renvela  HTN -Suboptimally controlled -Continue metoprolol -Will add prn IV hydralazine -She may need additional medications at the time of d/c  Asymptomatic Hep C -Recent positive HCV Ab -HCV RNA done on 8/16 with viral load 941,000 -ID consult is pending  Asthma -Continue Advair (Dulera per formulary), Albuterol   Advance Care Planning:   Code Status: Full Code   Consults: Neurosurgery; ID; nephrology; PT/OT; TOC team  DVT Prophylaxis: Eliquis  Family Communication: None present; I left a message for her son on voice mail at the time of admission  Severity of Illness: The appropriate patient status for this patient is INPATIENT. Inpatient status is judged to be reasonable and necessary in order to provide the required intensity of service to ensure the patient's safety. The patient's presenting symptoms, physical exam findings, and initial radiographic and laboratory data in the context of their chronic comorbidities is felt to place them at high risk for further clinical  deterioration. Furthermore, it is not anticipated that the patient will be medically stable for discharge from the hospital within 2 midnights of admission.   * I certify that at the point of admission it is my clinical judgment that the patient will require inpatient hospital care spanning beyond 2 midnights from the point of admission due to high intensity of service, high risk for further deterioration and high frequency of surveillance required.*  Author: Karmen Bongo, MD 07/26/2022 5:17 PM  For on call review [email or web address].

## 2022-07-26 NOTE — ED Provider Notes (Signed)
Case discussed with Dr Lorin Mercy regarding admission.   Diamond Rank, MD 07/26/22 1556

## 2022-07-27 DIAGNOSIS — N186 End stage renal disease: Secondary | ICD-10-CM | POA: Diagnosis not present

## 2022-07-27 DIAGNOSIS — Z992 Dependence on renal dialysis: Secondary | ICD-10-CM

## 2022-07-27 DIAGNOSIS — M4647 Discitis, unspecified, lumbosacral region: Secondary | ICD-10-CM | POA: Diagnosis not present

## 2022-07-27 LAB — CBC
HCT: 28.7 % — ABNORMAL LOW (ref 36.0–46.0)
Hemoglobin: 9.5 g/dL — ABNORMAL LOW (ref 12.0–15.0)
MCH: 32.2 pg (ref 26.0–34.0)
MCHC: 33.1 g/dL (ref 30.0–36.0)
MCV: 97.3 fL (ref 80.0–100.0)
Platelets: 163 10*3/uL (ref 150–400)
RBC: 2.95 MIL/uL — ABNORMAL LOW (ref 3.87–5.11)
RDW: 18.6 % — ABNORMAL HIGH (ref 11.5–15.5)
WBC: 3.9 10*3/uL — ABNORMAL LOW (ref 4.0–10.5)
nRBC: 0 % (ref 0.0–0.2)

## 2022-07-27 LAB — BASIC METABOLIC PANEL
Anion gap: 14 (ref 5–15)
BUN: 20 mg/dL (ref 8–23)
CO2: 23 mmol/L (ref 22–32)
Calcium: 9.1 mg/dL (ref 8.9–10.3)
Chloride: 101 mmol/L (ref 98–111)
Creatinine, Ser: 6.8 mg/dL — ABNORMAL HIGH (ref 0.44–1.00)
GFR, Estimated: 6 mL/min — ABNORMAL LOW (ref >60)
Glucose, Bld: 94 mg/dL (ref 70–99)
Potassium: 3.6 mmol/L (ref 3.5–5.1)
Sodium: 138 mmol/L (ref 135–145)

## 2022-07-27 LAB — HEPATITIS B SURFACE ANTIGEN: Hepatitis B Surface Ag: NONREACTIVE

## 2022-07-27 LAB — CULTURE, BLOOD (SINGLE)
MICRO NUMBER:: 13789178
MICRO NUMBER:: 13789185
Result:: NO GROWTH
Result:: NO GROWTH
SPECIMEN QUALITY:: ADEQUATE
SPECIMEN QUALITY:: ADEQUATE

## 2022-07-27 LAB — HEPATITIS B SURFACE ANTIBODY,QUALITATIVE: Hep B S Ab: NONREACTIVE

## 2022-07-27 LAB — PHOSPHORUS: Phosphorus: 5.6 mg/dL — ABNORMAL HIGH (ref 2.5–4.6)

## 2022-07-27 MED ORDER — RENA-VITE PO TABS
1.0000 | ORAL_TABLET | Freq: Every day | ORAL | Status: DC
  Administered 2022-07-28: 1 via ORAL
  Filled 2022-07-27: qty 1

## 2022-07-27 MED ORDER — SULFAMETHOXAZOLE-TRIMETHOPRIM 800-160 MG PO TABS
1.0000 | ORAL_TABLET | Freq: Every day | ORAL | Status: DC
  Administered 2022-07-28 (×2): 1 via ORAL
  Filled 2022-07-27 (×2): qty 1

## 2022-07-27 MED ORDER — LORAZEPAM 2 MG/ML IJ SOLN
0.5000 mg | Freq: Four times a day (QID) | INTRAMUSCULAR | Status: DC | PRN
  Administered 2022-07-27: 0.5 mg via INTRAVENOUS
  Filled 2022-07-27: qty 1

## 2022-07-27 NOTE — Progress Notes (Signed)
Pt receives out-pt HD at [facility] on TTS. Pt arrives at 11:15 for 11:35 chair time. Will assist as needed.   Melven Sartorius Renal Navigator [phone]

## 2022-07-27 NOTE — NC FL2 (Signed)
[place] LEVEL OF CARE SCREENING TOOL     IDENTIFICATION  Patient Name: Diamond [name] Birthdate: 08/05/1951 Sex: female Admission Date (Current Location): 07/25/2022  [facility] and [place] Number:  Herbalist and Address:  [facility] [place], [place] [address], [place], [place] [address]      Provider Number: [id number]  Attending Physician Name and Address:  Barb Merino, MD  Relative Name and Phone Number:       Current Level of Care: Hospital Recommended Level of Care: [place] Prior Approval Number:    Date Approved/Denied:   PASRR Number: [id number]  Discharge Plan: SNF    Current Diagnoses: Patient Active Problem List   Diagnosis Date Noted   Discitis of lumbosacral region 07/26/2022   Asymptomatic hypertensive urgency 07/21/2022   Malnutrition of moderate degree 06/22/2022   Physical deconditioning 06/18/2022   Pressure injury of skin 06/16/2022   MRSA bacteremia 06/13/2022   ESRD on dialysis [facility])    Paroxysmal atrial flutter ([place])    Acute metabolic encephalopathy    Acute on chronic anemia with positive fecal occult  02/05/2022   Multiple polyps of sigmoid colon    Heme positive stool    Duodenitis    Candida esophagitis ([place])    Gallbladder mass    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  01/16/2022   Septic discitis of lumbar region 01/16/2022   Hepatitis C without hepatic coma 01/15/2022   Sepsis due to methicillin resistant Staphylococcus aureus ([place]) 12/31/2021   Atrial flutter ([place]) 12/25/2021   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 seen on TEE on 1/17 12/24/2021   Lung nodule seen on imaging study 12/23/2021   Vertebral osteomyelitis ([place])    Severe sepsis ([place]) due to MRSA bacteremia, L5-S1 discitis and endocarditis 12/17/2021   Lower back pain 12/17/2021   ESRD on hemodialysis ([place]) 12/17/2021   Encounter for screening for other viral diseases  11/03/2021   Allergy, unspecified, initial encounter 09/14/2021   Coagulation defect, unspecified ([place]) 37/62/8315   Complication of vascular dialysis catheter 09/14/2021   Gout due to renal impairment, right ankle and foot 09/14/2021   Iron deficiency anemia, unspecified 09/14/2021   Pain, unspecified 09/14/2021   Pruritus, unspecified 09/14/2021   Secondary hyperparathyroidism of renal origin ([place]) 09/14/2021   Shortness of breath 09/14/2021   Unspecified protein-calorie malnutrition ([place]) 09/14/2021   Vitamin D deficiency 01/02/2021   Hyperparathyroidism ([place]) 12/31/2020   CKD (chronic kidney disease), stage III ([place]) 11/10/2019   Cardiomegaly 11/05/2019   Hyperkalemia 11/05/2019   Hypokalemia 10/18/2019   Pneumonia due to COVID-19 virus 10/17/2019   Hypertension    Asthma    Gout    Hyponatremia    Multifactorial anemia-acute blood loss from bleeding HD catheter site-superimposed on anemia related to ESRD.     Orientation RESPIRATION BLADDER Height & Weight     Self  Normal Continent Weight:   Height:     BEHAVIORAL SYMPTOMS/MOOD NEUROLOGICAL BOWEL NUTRITION STATUS      Continent Diet (Renal, fluid restrictions 1200 mL)  AMBULATORY STATUS COMMUNICATION OF NEEDS Skin   Extensive Assist Verbally Normal                       Personal Care Assistance Level of Assistance  Bathing, Dressing, Feeding Bathing Assistance: Limited assistance Feeding assistance: Limited assistance Dressing Assistance: Limited assistance     Functional Limitations Info  SPECIAL CARE FACTORS FREQUENCY  PT (By licensed PT), OT (By licensed OT)     PT Frequency: 5x/wk OT Frequency: 5x/wk            Contractures Contractures Info: Not present    Additional Factors Info  Code Status, Allergies Code Status Info: Full Allergies Info: Shellfish Allergy, Other (Anesthesia gives her a rash)           Current Medications (07/27/2022):  This is the current hospital  active medication list Current Facility-Administered Medications  Medication Dose Route Frequency Provider Last Rate Last Admin   acetaminophen (TYLENOL) tablet 650 mg  650 mg Oral Q6H PRN Karmen Bongo, MD   650 mg at 07/26/22 2212   Or   acetaminophen (TYLENOL) suppository 650 mg  650 mg Rectal Q6H PRN Karmen Bongo, MD       albuterol (PROVENTIL) (2.5 MG/3ML) 0.083% nebulizer solution 3 mL  3 mL Inhalation Q4H PRN Karmen Bongo, MD       apixaban Arne Cleveland) tablet 2.5 mg  2.5 mg Oral BID Karmen Bongo, MD   2.5 mg at 07/27/22 1046   calcium carbonate (dosed in mg elemental calcium) suspension 500 mg of elemental calcium  500 mg of elemental calcium Oral Q6H PRN Karmen Bongo, MD       camphor-menthol [employer]) lotion 1 Application  1 Application Topical M8U PRN Karmen Bongo, MD       And   hydrOXYzine (ATARAX) tablet 25 mg  25 mg Oral Q8H PRN Karmen Bongo, MD       Chlorhexidine Gluconate Cloth 2 % PADS 6 each  6 each Topical Q0600 Roney Jaffe, MD       cyclobenzaprine (FLEXERIL) tablet 2.5 mg  2.5 mg Oral Daily PRN Karmen Bongo, MD       docusate sodium (ENEMEEZ) enema 283 mg  1 enema Rectal PRN Karmen Bongo, MD       feeding supplement (NEPRO CARB STEADY) liquid 237 mL  237 mL Oral TID PRN Karmen Bongo, MD       hydrALAZINE (APRESOLINE) injection 10 mg  10 mg Intravenous Q4H PRN Kristopher Oppenheim, DO   10 mg at 07/27/22 0235   lidocaine (LIDODERM) 5 % 1 patch  1 patch Transdermal Daily PRN Karmen Bongo, MD       metoprolol tartrate (LOPRESSOR) tablet 50 mg  50 mg Oral BID Karmen Bongo, MD   50 mg at 07/27/22 1046   mometasone-formoterol (DULERA) 200-5 MCG/ACT inhaler 2 puff  2 puff Inhalation BID Karmen Bongo, MD   2 puff at 07/27/22 0833   ondansetron (ZOFRAN) tablet 4 mg  4 mg Oral Q6H PRN Karmen Bongo, MD       Or   ondansetron [facility]) injection 4 mg  4 mg Intravenous Q6H PRN Karmen Bongo, MD       oxyCODONE (Oxy IR/ROXICODONE) immediate release  tablet 5 mg  5 mg Oral BID PRN Karmen Bongo, MD   5 mg at 07/27/22 0235   pantoprazole (PROTONIX) EC tablet 40 mg  40 mg Oral BID Karmen Bongo, MD   40 mg at 07/27/22 1046   sevelamer carbonate (RENVELA) tablet 1,600 mg  1,600 mg Oral TID WC Karmen Bongo, MD   1,600 mg at 07/27/22 0900   sorbitol 70 % solution 30 mL  30 mL Oral PRN Karmen Bongo, MD       zolpidem Lorrin Mais) tablet 5 mg  5 mg Oral QHS PRN Karmen Bongo, MD  Discharge Medications: Please see discharge summary for a list of discharge medications.  Relevant Imaging Results:  Relevant Lab Results:   Additional Information SS#: [id number]; HD TTS at [facility], arrives 11:30 for 11:45 chair time  Geralynn Ochs, [place]

## 2022-07-27 NOTE — Consult Note (Signed)
Renal Service Consult Note [name]  Diamond [name] 07/27/2022 [name], MD Requesting Physician: Dr. [name]  Reason for Consult: ESRD pt w/ back pain, lumbar discitis HPI: The patient is a 71 y.o. year-old w/ hx of gout, HTN, atrial flutter, asthma, anemia and ESRD on HD who presented on 8/20 w/ lower back pain starting 2 days prior, no urinary symptoms, and 2 falls in the last week at home. Pt uses walker/ cane at baseline. TTS HD, has not missed any sessions.  Pt hospitalized in Jan 2023 for L5-S1 discitis w/ MRSA bacteremia and endocarditis rx'd w/ IV vanc for 8 wks. She had another recent admit from 7/8- 7/26 for MRSA bacteremia thought to be due to HD catheter. TDC was dc'd and she had a mature LUA AVF which was used and worked well. She rec'd IV vanc w/ HD through 07/13/22. In ED MRI showed discitis w/ phlegmon. NSGY recommended admit and IV abx. We are asked to see for dialysis.   Pt seen in room. She is pleasant but forgetful. Back pain is main c/o. No SOB, CP, abd pain or swelling of the legs.   ROS - denies CP, no joint pain, no HA, no blurry vision, no rash, no diarrhea, no nausea/ vomiting   Past Medical History  Past Medical History:  Diagnosis Date   Anemia    Asthma    Atrial flutter with rapid ventricular response ([place]) 12/25/2021   Chronic kidney disease    dialysis Tues Thurs Sat   Gout    Hypertension    Past Surgical History  Past Surgical History:  Procedure Laterality Date   AV FISTULA PLACEMENT Left 12/23/2021   Procedure: LEFT ARM BRACHIOBASILIC VEIN ARTERIOVENOUS (AV) FISTULA CREATION;  Surgeon: [name], MD;  Location: [place];  Service: Vascular;  Laterality: Left;   [place] Left 03/08/2022   Procedure: LEFT SECOND STAGE BASILIC VEIN TRANSPOSITION;  Surgeon: [name], MD;  Location: [place];  Service: Vascular;  Laterality: Left;   BIOPSY  01/19/2022   Procedure: BIOPSY;  Surgeon: Lavena Bullion, DO;  Location: [place] ENDOSCOPY;  Service: Gastroenterology;;   [place] N/A 06/25/2022   Procedure: OPEN CHOLECYSTECTOMY;  Surgeon: Dwan Bolt, MD;  Location: [place];  Service: General;  Laterality: N/A;   COLONOSCOPY Left 01/19/2022   Procedure: COLONOSCOPY;  Surgeon: Lavena Bullion, DO;  Location: [place];  Service: Gastroenterology;  Laterality: Left;   ESOPHAGOGASTRODUODENOSCOPY Left 01/19/2022   Procedure: ESOPHAGOGASTRODUODENOSCOPY (EGD);  Surgeon: Lavena Bullion, DO;  Location: [facility] ENDOSCOPY;  Service: Gastroenterology;  Laterality: Left;   INSERTION OF DIALYSIS CATHETER Right 12/23/2021   Procedure: INSERTION OF DIALYSIS CATHETER;  Surgeon: [name], MD;  Location: [place];  Service: Vascular;  Laterality: Right;   LAPAROSCOPY N/A 06/25/2022   Procedure: STAGING LAPAROSCOPY;  Surgeon: Dwan Bolt, MD;  Location: [place];  Service: General;  Laterality: N/A;   OPEN PARTIAL HEPATECTOMY  N/A 06/25/2022   Procedure: OPEN PARTIAL HEPATECTOMY;  Surgeon: Dwan Bolt, MD;  Location: [place];  Service: General;  Laterality: N/A;   POLYPECTOMY  01/19/2022   Procedure: POLYPECTOMY;  Surgeon: Lavena Bullion, DO;  Location: [place] ENDOSCOPY;  Service: Gastroenterology;;   TEE WITHOUT CARDIOVERSION N/A 12/22/2021   Procedure: TRANSESOPHAGEAL ECHOCARDIOGRAM (TEE);  Surgeon: Jerline Pain, MD;  Location: [facility] ENDOSCOPY;  Service: Cardiovascular;  Laterality: N/A;   TEE WITHOUT CARDIOVERSION N/A 06/17/2022  Procedure: TRANSESOPHAGEAL ECHOCARDIOGRAM (TEE);  Surgeon: Buford Dresser, MD;  Location: [facility] ENDOSCOPY;  Service: Cardiovascular;  Laterality: N/A;   ULTRASOUND GUIDANCE FOR VASCULAR ACCESS  12/23/2021   Procedure: ULTRASOUND GUIDANCE FOR VASCULAR ACCESS;  Surgeon: [name], MD;  Location: [facility] OR;  Service: Vascular;;   Family History  Family History  Problem Relation Age of Onset   Hypertension Mother    Hypertension Father     Colon cancer Brother    Lung cancer Brother    Social History  reports that she has never smoked. She has never been exposed to tobacco smoke. She has never used smokeless tobacco. She reports that she does not drink alcohol and does not use drugs. Allergies  Allergies  Allergen Reactions   Shellfish Allergy Other (See Comments)    Unknown reaction    Other Rash    Pt states anesthesia makes her brake out in rash   Home medications Prior to Admission medications   Medication Sig Start Date End Date Taking? Authorizing Provider  acetaminophen (TYLENOL) 325 MG tablet Take 2 tablets (650 mg total) by mouth 4 (four) times daily. 06/30/22  Yes Elgergawy, Silver Huguenin, MD  albuterol (VENTOLIN HFA) 108 (90 Base) MCG/ACT inhaler Inhale 2 puffs into the lungs every 4 (four) hours as needed for wheezing or shortness of breath. 10/08/19  Yes [provider]  apixaban (ELIQUIS) 2.5 MG TABS tablet Take 1 tablet (2.5 mg total) by mouth 2 (two) times daily. 02/06/22  Yes Ghimire, Henreitta Leber, MD  fluticasone-salmeterol (ADVAIR) 250-50 MCG/ACT AEPB Inhale 1 puff into the lungs 2 (two) times daily. 02/19/22  Yes [provider]  metoprolol tartrate (LOPRESSOR) 50 MG tablet Take 1 tablet (50 mg total) by mouth 2 (two) times daily. 12/30/21  Yes Ghimire, Henreitta Leber, MD  pantoprazole (PROTONIX) 40 MG tablet Take 1 tablet (40 mg total) by mouth 2 (two) times daily. 01/22/22  Yes Thurnell Lose, MD  sevelamer carbonate (RENVELA) 800 MG tablet Take 2 tablets (1,600 mg total) by mouth 3 (three) times daily with meals. 12/30/21  Yes Ghimire, Henreitta Leber, MD  cyclobenzaprine (FLEXERIL) 5 MG tablet Take 2.5 mg by mouth daily as needed for muscle spasms. Patient not taking: Reported on 07/26/2022    [provider]  docusate sodium (COLACE) 100 MG capsule Take 1 capsule (100 mg total) by mouth 2 (two) times daily. Patient not taking: Reported on 07/21/2022 06/30/22   Elgergawy, Silver Huguenin, MD  ondansetron  (ZOFRAN-ODT) 4 MG disintegrating tablet Take 1 tablet (4 mg total) by mouth every 8 (eight) hours as needed for nausea or vomiting. Patient not taking: Reported on 07/26/2022 06/08/22   Hans Eden, NP  oxyCODONE (ROXICODONE) 5 MG immediate release tablet Take 1 tablet (5 mg total) by mouth 2 (two) times daily as needed for up to 8 doses for severe pain or breakthrough pain. Patient not taking: Reported on 07/26/2022 07/07/22   Fransico Meadow, MD     Vitals:   07/27/22 0006 07/27/22 0224 07/27/22 0432 07/27/22 0833  BP: (!) 154/80 (!) 197/112 (!) 145/92 (!) 168/94  Pulse: 80 72 67 75  Resp:  '18 18 19  '$ Temp:  98.2 F (36.8 C) 98.3 F (36.8 C) 98.4 F (36.9 C)  TempSrc:  Oral Oral   SpO2:  97% 98% 97%   Exam Gen alert, no distress, pleasant and appropriate No rash, cyanosis or gangrene Sclera anicteric, throat clear  No jvd or bruits Chest clear bilat to  bases, no rales/ wheezing RRR no MRG Abd soft ntnd no mass or ascites +bs GU defer MS no joint effusions or deformity Ext no LE or UE edema, no wounds or ulcers Neuro is alert, Ox 3 , nf     LUA AVF+bruit    Home meds include - albuterol, apixaban, fluticasone-salmeterol, metoprolol 50 bid, pantoprazole, sevelamer carbonate 2 ac, docusate sodium, ondansteron prn, oxycodone IR prn, prns/ vits/ supps   OP HD: [place] TTS 4h  400/600  2/2 bath  LUE AVF  Hep none  - hep B labs 8/22: nonimmune - needs updating further  Assessment/ Plan: Back pain - MRI showing L5/S1 discitis. Pt had admit in Jan for similar discitis along w/ endocarditis/ MRSA bacteremia. She also had recent admit in July for MRSA bacteremia felt to be [facility] related. TDC was removed and her AVF was used, abx were completed on 8/8.  Per pmd, getting IV abx.  ESRD - on HD TTS. Has not missed HD. HD today.  HTN - BP's on the high side, max UF w/ HD today. Cont home meds.  Atrial flutter - taking eliquis, BB Volume - no gross vol excess on exam. UF 3 L w/ HD today  as tol Anemia esrd - Hb 9- 11 here, get records. Transfuse prn.  MBD ckd - CCa in range, will add on phos. Cont renvela as binder.     Rob Kamariya Blevens  MD 07/27/2022, 12:26 PM Recent Labs  Lab 07/26/22 0407 07/26/22 0442 07/27/22 0412  HGB 10.8*  --  9.5*  ALBUMIN  --  2.7*  --   CALCIUM  --  9.6 9.1  CREATININE  --  5.91* 6.80*  K  --  3.6 3.6   Inpatient medications:  apixaban  2.5 mg Oral BID   Chlorhexidine Gluconate Cloth  6 each Topical Q0600   metoprolol tartrate  50 mg Oral BID   mometasone-formoterol  2 puff Inhalation BID   pantoprazole  40 mg Oral BID   sevelamer carbonate  1,600 mg Oral TID WC   sulfamethoxazole-trimethoprim  1 tablet Oral QHS    acetaminophen **OR** acetaminophen, albuterol, calcium carbonate (dosed in mg elemental calcium), camphor-menthol **AND** hydrOXYzine, cyclobenzaprine, docusate sodium, feeding supplement (NEPRO CARB STEADY), hydrALAZINE, lidocaine, ondansetron **OR** ondansetron (ZOFRAN) IV, oxyCODONE, sorbitol, zolpidem

## 2022-07-27 NOTE — Progress Notes (Signed)
Orthopedic Tech Progress Note Patient Details:  Diamond [name] 1951-02-25 [id number]  Ortho Devices Type of Ortho Device: Lumbar corsett Ortho Device/Splint Interventions: Ordered, Application, Adjustment   Post Interventions Patient Tolerated: Well Instructions Provided: Care of device, Adjustment of device  [name] 07/27/2022, 2:45 AM

## 2022-07-27 NOTE — Consult Note (Signed)
[facility] for Infectious Disease    Date of Admission:  07/25/2022     Reason for Consult: back pain, hx vertebral lumbar OM    Referring Provider: Barb Merino     Abx: S/p 4 weeks        Assessment: Worsening acute on chronic intermittent back pain Hx recurrent mrsa bacteremia 06/12/22 s/p 4 weeks abx treatment by 8/08; negative tee outside of stable small mobile echodensity in RVOT; hd catheter exchagned Hx mrsa bacteremia and back pain with psoas involvement s/p 8 weeks iv abx starting 12/2021; hd catheter exchanged   Mri suggest L2-3 area paravertebral edema. Crp's overall trending down but still elevated. Would be difficult to not treat what mri changes suggest unless repeat imaging shows stability  Prior mrsa resistant to doxy, sensitive to bactrim  Plan: Start bactrim; plan 4 weeks from today until 9/19 Await bcx -- if negative (was negative a few days prior to presentation); would discharge with id clinic f/u She is setup to have rcid clinic f/u with me on 9/6 @ 230pm Discussed with primary team    [facility] clinic [place], [place], [place] [address] Phone: [phone]   I spent 75 minute reviewing data/chart, and coordinating care and >50% direct face to face time providing counseling/discussing diagnostics/treatment plan with patient      ------------------------------------------------ Principal Problem:   Discitis of lumbosacral region Active Problems:   Hypertension   Asthma   ESRD on hemodialysis ([place])   Hepatitis C without hepatic coma   Paroxysmal atrial flutter ([facility])    HPI: Diamond [name] is a 71 y.o. female esrd on iHD, hx mrsa bacteremia x2 with lumbar spine involvement here for acute on chronic back pain with mri imaging suggesting paravertebral edema   I am familiar with patient and treated her for 8 weeks abx starting 12/2021 for bacteremia/l5-s1 vertebral om.  She became bacteremic again 06/2022 and saw my  partner dr Juleen China. There was no concern for new vertebral involvement. Tee stable. Treated 4 weeks vanc finished by 8/8  She reports worsening intermittent back pain about 3 weeks prior to this admission No fever, chill, malaise, decreased appetite, weakness otherwise  She saw my partner on follow up several days ago. Repeat bcx negative. Inflammatory markers down trending  Due to worsening pain she came for evaluation and mri showed L2-3 paravertebral edema. Crp continues to trend down and she is nontoxic   No other complaint    Family History  Problem Relation Age of Onset   Hypertension Mother    Hypertension Father    Colon cancer Brother    Lung cancer Brother     Social History   Tobacco Use   Smoking status: Never    Passive exposure: Never   Smokeless tobacco: Never  Vaping Use   Vaping Use: Never used  Substance Use Topics   Alcohol use: No   Drug use: No    Allergies  Allergen Reactions   Shellfish Allergy Other (See Comments)    Unknown reaction    Other Rash    Pt states anesthesia makes her brake out in rash    Review of Systems: ROS All Other ROS was negative, except mentioned above   Past Medical History:  Diagnosis Date   Anemia    Asthma    Atrial flutter with rapid ventricular response ([place]) 12/25/2021   Chronic kidney disease    dialysis Tues Thurs Sat  Gout    Hypertension        Scheduled Meds:  apixaban  2.5 mg Oral BID   Chlorhexidine Gluconate Cloth  6 each Topical Q0600   metoprolol tartrate  50 mg Oral BID   mometasone-formoterol  2 puff Inhalation BID   pantoprazole  40 mg Oral BID   sevelamer carbonate  1,600 mg Oral TID WC   Continuous Infusions: PRN Meds:.acetaminophen **OR** acetaminophen, albuterol, calcium carbonate (dosed in mg elemental calcium), camphor-menthol **AND** hydrOXYzine, cyclobenzaprine, docusate sodium, feeding supplement (NEPRO CARB STEADY), hydrALAZINE, lidocaine, ondansetron **OR** ondansetron  (ZOFRAN) IV, oxyCODONE, sorbitol, zolpidem   OBJECTIVE: Blood pressure (!) 168/94, pulse 75, temperature 98.4 F (36.9 C), resp. rate 19, SpO2 97 %.  Physical Exam General/constitutional: no distress, pleasant HEENT: Normocephalic, PER, Conj Clear, EOMI, Oropharynx clear Neck supple CV: rrr no mrg Lungs: clear to auscultation, normal respiratory effort Abd: Soft, Nontender Ext: no edema Skin: No Rash Neuro: nonfocal MSK: no peripheral joint swelling/tenderness/warmth; back spines nontender       Lab Results Lab Results  Component Value Date   WBC 3.9 (L) 07/27/2022   HGB 9.5 (L) 07/27/2022   HCT 28.7 (L) 07/27/2022   MCV 97.3 07/27/2022   PLT 163 07/27/2022    Lab Results  Component Value Date   CREATININE 6.80 (H) 07/27/2022   BUN 20 07/27/2022   NA 138 07/27/2022   K 3.6 07/27/2022   CL 101 07/27/2022   CO2 23 07/27/2022    Lab Results  Component Value Date   ALT 6 07/26/2022   AST 13 (L) 07/26/2022   ALKPHOS 54 07/26/2022   BILITOT 0.8 07/26/2022      Microbiology: Recent Results (from the past 240 hour(s))  Blood culture (routine single)     Status: None   Collection Time: 07/21/22  4:15 AM   Specimen: Blood  Result Value Ref Range Status   MICRO NUMBER: [id number]  Final   SPECIMEN QUALITY: Adequate  Final   Source BLOOD 1  Final   STATUS: FINAL  Final   Result: No growth after 5 days  Final   COMMENT: Aerobic and anaerobic bottle received.  Final  Blood culture (routine single)     Status: None   Collection Time: 07/21/22  4:23 AM   Specimen: Blood  Result Value Ref Range Status   MICRO NUMBER: [id number]  Final   SPECIMEN QUALITY: Adequate  Final   Source BLOOD 2  Final   STATUS: FINAL  Final   Result: No growth after 5 days  Final   COMMENT: Aerobic and anaerobic bottle received.  Final  Blood culture (routine x 2)     Status: None (Preliminary result)   Collection Time: 07/26/22 11:34 AM   Specimen: BLOOD RIGHT HAND  Result Value Ref  Range Status   Specimen Description BLOOD RIGHT HAND  Final   Special Requests   Final    BOTTLES DRAWN AEROBIC AND ANAEROBIC Blood Culture adequate volume   Culture   Final    NO GROWTH < 24 HOURS Performed at [facility], [place] [address], [place], [place] [address]    Report Status PENDING  Incomplete     Serology:    Imaging: If present, new imagings (plain films, ct scans, and mri) have been personally visualized and interpreted; radiology reports have been reviewed. Decision making incorporated into the Impression / Recommendations.   8/21 mri lumbar spine without contrast 1. Transitional lumbosacral anatomy with sacralized L5 segment and  rudimentary L5-S1 disc. Correlation with radiographs is recommended prior to any operative intervention. 2. Recent/healing L3 fracture as described on recent CT with edema in the L2-L3 disc and superimposed surrounding paravertebral edema, which is nonspecific but raises concern for superimposed discitis with surrounding phlegmon/infection (particularly given below finding and reported clinical concern). Posttraumatic inflammatory change/edema is a consideration but thought less likely. No discrete, drainable fluid collection identified; however, the absence of contrast limits assessment. If the patient is able, recommend post-contrast imaging. 3. Improved L4-L5 disc and surrounding bone marrow edema, possibly representing improving discitis/osteomyelitis at this level. Degenerative change is a differential consideration. 4. Similar superimposed multilevel degenerative change, including moderate to severe canal stenosis at L3-L4 and L4-L5.   8/21 mri thoracic spine without contrast No evidence of acute abnormality or significant canal/foraminal stenosis.   12/2021 mri thoracolumbar spine without contrast Endplate irregularity and marrow edema with disc edema at L5-S1. Mild right psoas edema. Some ill-defined signal in the  ventral epidural space above and below this disc level. This may be degenerative in etiology or reflect discitis/osteomyelitis.   Degenerative changes result in moderate to marked canal stenosis at L4-L5 with narrowing of subarticular recesses.  No evidence om/discitis in thoracic spine    Jabier Mutton, [facility] [place] [phone] pager    07/27/2022, 11:36 AM

## 2022-07-27 NOTE — Evaluation (Signed)
Physical Therapy Evaluation Patient Details Name: Diamond [name] MRN: [id number] DOB: 22-Jun-1951 Today's Date: 07/27/2022  History of Present Illness  Pt is a 71 y/o F presenting with worsening back pain - MRI revealed discitis with phlegmon. PMH includes L5-S1 discitis with MRSA bacteremia and endocarditis in 12/2021 and from 7/8-7/26, aflutter; ESRD on TTS HD, and HTN  Clinical Impression  Received pt sidelying in bed in fetal position. Pt required increased time to arouse and disoriented throughout. Pt asking therapist "who let you in my house" and "why are you here?" Gently reoriented pt, but pt continued to ask the same questions. Pt's son called and provided subjective history, explaining that pt currently lives with her daughter who works from home but is unable to provide the care the pt needs and children requesting pt go to SNF. Pt performed bed mobility with min A and donned lumbar corsett with total A in sitting. Pt transferred to/from bedside commode with RW and min A with very poor RW safety awareness - attempting to hold onto bottom bar of RW. Pt able to void and performed hygiene management in standing with min A. Per son, pt was mod I with RW/SPC prior to admission with minor cognitive deficits at baseline. Recommend SNF at this time due to current impairments. Acute PT to cont to follow.      Recommendations for follow up therapy are one component of a multi-disciplinary discharge planning process, led by the attending physician.  Recommendations may be updated based on patient status, additional functional criteria and insurance authorization.  Follow Up Recommendations Skilled nursing-short term rehab (<3 hours/day) Can patient physically be transported by private vehicle: No    Assistance Recommended at Discharge Frequent or constant Supervision/Assistance  Patient can return home with the following  A little help with walking and/or transfers;A little help with  bathing/dressing/bathroom;Direct supervision/assist for medications management;Direct supervision/assist for financial management;Assist for transportation;Help with stairs or ramp for entrance;Assistance with cooking/housework    Equipment Recommendations Other (comment) (has RW, SPC, and WC per son report)  Recommendations for Other Services  OT consult    Functional Status Assessment Patient has had a recent decline in their functional status and demonstrates the ability to make significant improvements in function in a reasonable and predictable amount of time.     Precautions / Restrictions Precautions Precautions: Fall;Back Required Braces or Orthoses: Other Brace Other Brace: lumbar corsett when OOB Restrictions Weight Bearing Restrictions: No      Mobility  Bed Mobility Overal bed mobility: Needs Assistance Bed Mobility: Supine to Sit, Sit to Supine     Supine to sit: Min assist Sit to supine: Supervision   General bed mobility comments: HOB slightly elevated. Attempted to educate pt on back precautions but pt twisting/turning in bed breaking precautions Patient Response: Restless  Transfers Overall transfer level: Needs assistance Equipment used: Rolling walker (2 wheels) Transfers: Sit to/from Stand, Bed to chair/wheelchair/BSC Sit to Stand: Min assist Stand pivot transfers: Min assist         General transfer comment: Donned lumbar corsett seated EOB with total A. Pt stood from EOB with RW and min A and transferred to/from bedside commode with RW and min A. Pt with very poor safety awareness attempting to hold onto bottom bar of RW and required max hand over hand guidance and max cues for safety.    Ambulation/Gait                  Stairs  Wheelchair Mobility    Modified Rankin (Stroke Patients Only)       Balance Overall balance assessment: Needs assistance Sitting-balance support: Bilateral upper extremity supported, Feet  supported Sitting balance-Leahy Scale: Fair Sitting balance - Comments: able to maintain static sitting balance with close supervision while therapist donned lumbar corsett   Standing balance support: Bilateral upper extremity supported, Reliant on assistive device for balance (RW) Standing balance-Leahy Scale: Poor Standing balance comment: Pt required min guard/min A for static standing balance and min A for dynamic standing balance - max cues for RW safety and proper hand placement                             Pertinent Vitals/Pain Pain Assessment Pain Assessment: No/denies pain    Home Living Family/patient expects to be discharged to:: Private residence Living Arrangements: Children (lives with daughter) Available Help at Discharge: Family;Available 24 hours/day (daughter works from home) Type of Home: House Home Access: Stairs to enter Entrance Stairs-Rails: Psychiatric nurse of Steps: 1   Home Layout: One level Home Equipment: Conservation officer, nature (2 wheels);Cane - single point;Wheelchair - manual Additional Comments: pt slightly disoriented stating "who let you in the house" and unable to provide accurrate history. Pt's son called during session - therefore took subjective history from him. Pt's son reports him and his sister both want the pt to D/C to SNF.    Prior Function Prior Level of Function : Independent/Modified Independent             Mobility Comments: per son pt was walking around independently using her cane and/or RW       Hand Dominance   Dominant Hand: Right    Extremity/Trunk Assessment   Upper Extremity Assessment Upper Extremity Assessment: Defer to OT evaluation    Lower Extremity Assessment Lower Extremity Assessment: Generalized weakness    Cervical / Trunk Assessment Cervical / Trunk Assessment: Kyphotic  Communication   Communication: No difficulties;Other (comment) (confused/disoriented)  Cognition  Arousal/Alertness: Lethargic Behavior During Therapy: Restless, Agitated Overall Cognitive Status: Difficult to assess                                 General Comments: pt very disoriented, asking who let therapist "in her house" but was able to recall that she came to [facility] the other night for her back pain.        General Comments      Exercises     Assessment/Plan    PT Assessment Patient needs continued PT services  PT Problem List Decreased strength;Decreased activity tolerance;Decreased balance;Decreased mobility;Decreased coordination;Decreased cognition;Decreased knowledge of use of DME;Decreased safety awareness;Decreased knowledge of precautions;Cardiopulmonary status limiting activity       PT Treatment Interventions DME instruction;Balance training;Gait training;Neuromuscular re-education;Stair training;Cognitive remediation;Functional mobility training;Patient/family education;Wheelchair mobility training;Therapeutic activities;Therapeutic exercise    PT Goals (Current goals can be found in the Care Plan section)  Acute Rehab PT Goals Patient Stated Goal: did not state PT Goal Formulation: With patient/family Time For Goal Achievement: 08/10/22 Potential to Achieve Goals: Fair    Frequency Min 2X/week     Co-evaluation               AM-PAC PT "6 Clicks" Mobility  Outcome Measure Help needed turning from your back to your side while in a flat bed without using bedrails?: A Little Help needed moving from  lying on your back to sitting on the side of a flat bed without using bedrails?: A Little Help needed moving to and from a bed to a chair (including a wheelchair)?: A Little Help needed standing up from a chair using your arms (e.g., wheelchair or bedside chair)?: A Little Help needed to walk in hospital room?: A Lot Help needed climbing 3-5 steps with a railing? : A Lot 6 Click Score: 16    End of Session Equipment Utilized During  Treatment: Back brace Activity Tolerance: Patient tolerated treatment well Patient left: in bed;with call bell/phone within reach;with bed alarm set Nurse Communication: Mobility status PT Visit Diagnosis: Unsteadiness on feet (R26.81);Other abnormalities of gait and mobility (R26.89);Muscle weakness (generalized) (M62.81)    Time: 3500-9381 PT Time Calculation (min) (ACUTE ONLY): 26 min   Charges:   PT Evaluation $PT Eval Moderate Complexity: 1 Mod PT Treatments $Therapeutic Activity: 8-22 mins        Becky Sax PT, DPT  Diamond [name] 07/27/2022, 9:09 AM

## 2022-07-27 NOTE — Evaluation (Signed)
Occupational Therapy Evaluation Patient Details Name: Diamond [name] MRN: [id number] DOB: [birth year] Today's Date: 07/27/2022   History of Present Illness Pt is a 71 y/o F presenting with worsening back pain - MRI revealed discitis with phlegmon. PMH includes L5-S1 discitis with MRSA bacteremia and endocarditis in 12/2021 and from 7/8-7/26, aflutter; ESRD on TTS HD, and HTN   Clinical Impression   Pt independent at baseline with ADLs, reports using cane for mobility. Pt reporting living alone, per chart review/PT note who contacted son, pt lives with daughter. Pt currently needing min guard-mod A for ADLs and min guard for bed mobility. Pt declining further mobility at this time. Pt with questionable cog/visual deficits, asking who "the man in the room is" and thinking back brace was a purse. Pt presenting with impairments listed below, will follow acutely. Recommend SNF at d/c.     Recommendations for follow up therapy are one component of a multi-disciplinary discharge planning process, led by the attending physician.  Recommendations may be updated based on patient status, additional functional criteria and insurance authorization.   Follow Up Recommendations  Skilled nursing-short term rehab (<3 hours/day)    Assistance Recommended at Discharge Frequent or constant Supervision/Assistance  Patient can return home with the following A lot of help with walking and/or transfers;A lot of help with bathing/dressing/bathroom;Assistance with cooking/housework;Direct supervision/assist for medications management;Help with stairs or ramp for entrance;Assist for transportation;Direct supervision/assist for financial management    Functional Status Assessment  Patient has had a recent decline in their functional status and demonstrates the ability to make significant improvements in function in a reasonable and predictable amount of time.  Equipment Recommendations  None recommended by OT;Other  (comment) (defer)    Recommendations for Other Services PT consult     Precautions / Restrictions Precautions Precautions: Fall;Back Precaution Comments: will review when pt more alert Required Braces or Orthoses: Other Brace Other Brace: lumbar corsett when OOB Restrictions Weight Bearing Restrictions: No      Mobility Bed Mobility Overal bed mobility: Needs Assistance Bed Mobility: Sidelying to Sit, Sit to Sidelying   Sidelying to sit: Min guard     Sit to sidelying: Min guard      Transfers                   General transfer comment: pt declining, wanting to lay down      Balance Overall balance assessment: Needs assistance Sitting-balance support: Bilateral upper extremity supported, Feet supported Sitting balance-Leahy Scale: Fair Sitting balance - Comments: sits EOB for ~1 min before laying down       Standing balance comment: pt declining                           ADL either performed or assessed with clinical judgement   ADL Overall ADL's : Needs assistance/impaired Eating/Feeding: Supervision/ safety   Grooming: Min guard   Upper Body Bathing: Moderate assistance   Lower Body Bathing: Moderate assistance   Upper Body Dressing : Moderate assistance   Lower Body Dressing: Moderate assistance   Toilet Transfer: Moderate assistance   Toileting- Clothing Manipulation and Hygiene: Moderate assistance       Functional mobility during ADLs: Minimal assistance       Vision   Additional Comments: will further assess, pt thinking back brace is "purse" in the chair     Perception     Praxis      Pertinent Vitals/Pain Pain Assessment  Pain Assessment: No/denies pain     Hand Dominance Right   Extremity/Trunk Assessment Upper Extremity Assessment Upper Extremity Assessment: Generalized weakness   Lower Extremity Assessment Lower Extremity Assessment: Defer to PT evaluation   Cervical / Trunk Assessment Cervical /  Trunk Assessment: Kyphotic   Communication Communication Communication: No difficulties   Cognition Arousal/Alertness: Lethargic Behavior During Therapy: Restless Overall Cognitive Status: No family/caregiver present to determine baseline cognitive functioning                                 General Comments: A & Ox4, and knows she has a compression fx, however points toward door asking who "the man is" needing clarification that that is a pt in another room     General Comments  VSS on RA    Exercises     Shoulder Instructions      Home Living Family/patient expects to be discharged to:: Private residence Living Arrangements: Children Available Help at Discharge: Family;Available 24 hours/day Type of Home: House Home Access: Stairs to enter [employer] of Steps: 1 Entrance Stairs-Rails: Right;Left Home Layout: One level     Bathroom Shower/Tub: Teacher, early years/pre: Standard Bathroom Accessibility: Yes   Home Equipment: Conservation officer, nature (2 wheels);Cane - single point;Wheelchair - manual   Additional Comments: pt stating she lives alone, some home info provided contradictory to info obtained from son      Prior Functioning/Environment Prior Level of Function : Independent/Modified Independent             Mobility Comments: per son pt was walking around independently using her cane and/or RW ADLs Comments: no assist for ADL        OT Problem List: Decreased strength;Decreased range of motion;Decreased activity tolerance;Impaired balance (sitting and/or standing);Decreased cognition;Decreased safety awareness      OT Treatment/Interventions: Self-care/ADL training;Therapeutic exercise;DME and/or AE instruction;Energy conservation;Therapeutic activities;Patient/family education;Balance training    OT Goals(Current goals can be found in the care plan section) Acute Rehab OT Goals Patient Stated Goal: to rest OT Goal  Formulation: With patient Time For Goal Achievement: 08/10/22 Potential to Achieve Goals: Good ADL Goals Pt Will Perform Upper Body Dressing: with min assist;sitting Pt Will Perform Lower Body Dressing: with mod assist;with adaptive equipment;sit to/from stand;sitting/lateral leans Pt Will Transfer to Toilet: with min assist;ambulating;regular height toilet Additional ADL Goal #1: pt will complete bed mobility independently in prep for ADLs  OT Frequency: Min 2X/week    Co-evaluation              AM-PAC OT "6 Clicks" Daily Activity     Outcome Measure Help from another person eating meals?: None Help from another person taking care of personal grooming?: A Little Help from another person toileting, which includes using toliet, bedpan, or urinal?: A Lot Help from another person bathing (including washing, rinsing, drying)?: A Lot Help from another person to put on and taking off regular upper body clothing?: A Lot Help from another person to put on and taking off regular lower body clothing?: A Lot 6 Click Score: 15   End of Session Nurse Communication: Mobility status  Activity Tolerance: Patient tolerated treatment well Patient left: in bed;with call bell/phone within reach;with bed alarm set  OT Visit Diagnosis: Unsteadiness on feet (R26.81);Other abnormalities of gait and mobility (R26.89);Muscle weakness (generalized) (M62.81)                Time: 0932-6712  OT Time Calculation (min): 16 min Charges:  OT General Charges $OT Visit: 1 Visit OT Evaluation $OT Eval Low Complexity: 1 Low  Lynnda Child, OTD, OTR/L Acute Rehab [phone] [place] 07/27/2022, 4:00 PM

## 2022-07-27 NOTE — Consult Note (Addendum)
   [facility] Inpatient Consult   07/27/2022  Diamond [name] 1951-07-17 [id number]  [facility] [ACO] Patient: Diamond [name] HMO  Primary Care Provider:  [name], MD, [facility]  Addendum: [phone] Patient is less than 30 days readmission and extreme high risk scores for unplanned readmission risk and report for [facility]  Readmission Report request   Patient is currently active with [facility] for chronic disease management services.  Patient has been engaged by a [facility].  Our community based plan of care has focused on disease management and community resource support.    1545 patient in care with care team on unit rounds.   Plan: Continue to follow for needs current recommendations are for a skilled nursing facility for post hospital transitional needs.  Will follow for disposition and update [facility] RNCM of any additional follow up needs.   Of note, [facility] Care Management services does not replace or interfere with any services that are needed or arranged by inpatient [facility] care management team.  For additional questions or referrals please contact:  For questions:  Natividad Brood, RN BSN [facility] Liaison  [phone] business mobile phone Toll free office [phone]  Fax number: [phone] [email or web address]

## 2022-07-27 NOTE — Progress Notes (Signed)
Initial Nutrition Assessment  DOCUMENTATION CODES:   Non-severe (moderate) malnutrition in context of chronic illness  INTERVENTION:  Liberalize diet to 2g Na, encourage PO intake Renal MVI daily Magic cup TID with meals, each supplement provides 290 kcal and 9 grams of protein  NUTRITION DIAGNOSIS:   Moderate Malnutrition related to chronic illness (ESRD on HD) as evidenced by mild fat depletion, moderate muscle depletion, severe muscle depletion.  GOAL:   Patient will meet greater than or equal to 90% of their needs  MONITOR:   PO intake, Labs, I & O's  REASON FOR ASSESSMENT:   Consult Assessment of nutrition requirement/status  ASSESSMENT:   Pt with hx of ESRD on HD, HTN, and gout presented to ED with worsening back pain. Imaging in ED positive for discitis with phlegmon.  Pt followed by RD team during previous admission in July. Outpatient Estimated Dry Weight: 58kg  Pt resting in bed at the time of assessment. Breakfast tray noted at bedside ~50% consumed. Pt confused and not able to provide much hx. Can't remember what she had for lunch. Also goes back and forth between wether or not she likes ensure products.   Pt endorses significant weight loss, unable to state how much.   Discussed with RN, pt had a late breakfast, did not order lunch. Endorses confusion but states that family told her pt was at her baseline mentation.   For now, will liberalize diet to low sodium as K is WNL and intake does not seem to be adequate.     Nutritionally Relevant Medications: Scheduled Meds:  pantoprazole  40 mg Oral BID   sevelamer carbonate  1,600 mg Oral TID WC   Labs Reviewed: Creatinine 6.8  NUTRITION - FOCUSED PHYSICAL EXAM: Flowsheet Row Most Recent Value  Orbital Region Mild depletion  Upper Arm Region Moderate depletion  Thoracic and Lumbar Region Mild depletion  Buccal Region Mild depletion  Temple Region Mild depletion  Clavicle Bone Region Moderate  depletion  Clavicle and Acromion Bone Region Severe depletion  Scapular Bone Region Moderate depletion  Dorsal Hand Severe depletion  Patellar Region Moderate depletion  Anterior Thigh Region Moderate depletion  Posterior Calf Region Moderate depletion  Edema (RD Assessment) None  Hair Reviewed  Eyes Reviewed  Mouth Reviewed  Skin Reviewed  Nails Reviewed    Diet Order:   Pt allergic to shellfish and shrimp Diet Order             Diet 2 gram sodium Room service appropriate? Yes; Fluid consistency: Thin  Diet effective now                   EDUCATION NEEDS:   Not appropriate for education at this time  Skin:  Skin Assessment: Reviewed RN Assessment  Last BM:  unsure  Height:  Ht Readings from Last 1 Encounters:  07/07/22 '5\' 7"'$  (1.702 m)    Weight:  Wt Readings from Last 1 Encounters:  07/07/22 61.2 kg    Ideal Body Weight:  61.4 kg  BMI:  There is no height or weight on file to calculate BMI.  Estimated Nutritional Needs:  Kcal:  1700-1900 kcal/d Protein:  85-100g/d Fluid:  1L+UOP    Ranell Patrick, RD, LDN Clinical Dietitian RD pager # available in [facility]  After hours/weekend pager # available in [facility]

## 2022-07-27 NOTE — TOC Initial Note (Signed)
Transition of Care [facility]) - Initial/Assessment Note    Patient Details  Name: Diamond [name] MRN: [id number] Date of Birth: [birth year]  Transition of Care [facility]) CM/SW Contact:    Geralynn Ochs, LCSW Phone Number: 07/27/2022, 4:02 PM  Clinical Narrative:           CSW spoke with patient's daughter, [name], to discuss recommendation for SNF placement. Daughter in agreement, said they tried to do this last time patient was in the hospital but she refused to go. Daughter interested in [place], but will be agreeable to whoever has a bed. CSW discussed barrier of needing to find a HD bed, and daughter indicated understanding. CSW faxed out referral, asked [place] to review. CSW to follow.        Expected Discharge Plan: Skilled Nursing Facility Barriers to Discharge: Continued Medical Work up, Ship broker   Patient Goals and CMS Choice Patient states their goals for this hospitalization and ongoing recovery are:: patient unable to participate in goal setting, not fully oriented CMS Medicare.gov Compare Post Acute Care list provided to:: Patient Represenative (must comment) Choice offered to / list presented to : Adult Children  Expected Discharge Plan and Services Expected Discharge Plan: [place] Acute Care Choice: [place] Living arrangements for the past 2 months: Single Family Home                                      Prior Living Arrangements/Services Living arrangements for the past 2 months: Single Family Home Lives with:: Adult Children Patient language and need for interpreter reviewed:: No Do you feel safe going back to the place where you live?: Yes      Need for Family Participation in Patient Care: Yes (Comment) Care giver support system in place?: No (comment)   Criminal Activity/Legal Involvement Pertinent to Current Situation/Hospitalization: No - Comment as needed  Activities of Daily  Living Home Assistive Devices/Equipment: Gilford Rile (specify type) ADL Screening (condition at time of admission) Patient's cognitive ability adequate to safely complete daily activities?: Yes Is the patient deaf or have difficulty hearing?: No Does the patient have difficulty seeing, even when wearing glasses/contacts?: No Does the patient have difficulty concentrating, remembering, or making decisions?: Yes Patient able to express need for assistance with ADLs?: Yes Does the patient have difficulty dressing or bathing?: Yes Independently performs ADLs?: Yes (appropriate for developmental age) Does the patient have difficulty walking or climbing stairs?: No Weakness of Legs: Both Weakness of Arms/Hands: None  Permission Sought/Granted Permission sought to share information with : Facility Sport and exercise psychologist, Family Supports Permission granted to share information with : Yes, Verbal Permission Granted  Share Information with NAME: Doyne Keel  Permission granted to share info w AGENCY: SNF  Permission granted to share info w Relationship: Children     Emotional Assessment   Attitude/Demeanor/Rapport: Unable to Assess Affect (typically observed): Unable to Assess Orientation: : Oriented to Self, Oriented to Place Alcohol / Substance Use: Not Applicable Psych Involvement: No (comment)  Admission diagnosis:  Discitis of lumbar region [M46.46] Discitis of lumbosacral region [M46.47] Compression fracture of L3 vertebra, initial encounter [employer] [facility]) [S32.030A] Patient Active Problem List   Diagnosis Date Noted   Discitis of lumbosacral region 07/26/2022   Asymptomatic hypertensive urgency 07/21/2022   Malnutrition of moderate degree 06/22/2022   Physical deconditioning 06/18/2022   Pressure injury of skin  06/16/2022   MRSA bacteremia 06/13/2022   ESRD on dialysis [facility])    Paroxysmal atrial flutter (HCC)    Acute metabolic encephalopathy    Acute on chronic anemia with  positive fecal occult  02/05/2022   Multiple polyps of sigmoid colon    Heme positive stool    Duodenitis    Candida esophagitis (HCC)    Gallbladder mass    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  01/16/2022   Septic discitis of lumbar region 01/16/2022   Hepatitis C without hepatic coma 01/15/2022   Sepsis due to methicillin resistant Staphylococcus aureus ([place]) 12/31/2021   Atrial flutter ([place]) 12/25/2021   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 seen on TEE on 1/17 12/24/2021   Lung nodule seen on imaging study 12/23/2021   Vertebral osteomyelitis ([place])    Severe sepsis ([place]) due to MRSA bacteremia, L5-S1 discitis and endocarditis 12/17/2021   Lower back pain 12/17/2021   ESRD on hemodialysis ([place]) 12/17/2021   Encounter for screening for other viral diseases 11/03/2021   Allergy, unspecified, initial encounter 09/14/2021   Coagulation defect, unspecified ([place]) 84/13/2440   Complication of vascular dialysis catheter 09/14/2021   Gout due to renal impairment, right ankle and foot 09/14/2021   Iron deficiency anemia, unspecified 09/14/2021   Pain, unspecified 09/14/2021   Pruritus, unspecified 09/14/2021   Secondary hyperparathyroidism of renal origin ([place]) 09/14/2021   Shortness of breath 09/14/2021   Unspecified protein-calorie malnutrition ([place]) 09/14/2021   Vitamin D deficiency 01/02/2021   Hyperparathyroidism ([place]) 12/31/2020   CKD (chronic kidney disease), stage III ([place]) 11/10/2019   Cardiomegaly 11/05/2019   Hyperkalemia 11/05/2019   Hypokalemia 10/18/2019   Pneumonia due to COVID-19 virus 10/17/2019   Hypertension    Asthma    Gout    Hyponatremia    Multifactorial anemia-acute blood loss from bleeding HD catheter site-superimposed on anemia related to ESRD.    PCP:  [name], MD Pharmacy:   [facility] [address] [place]), [place] - [address] [place] ([place]) [place] [address] Phone:  [phone] Fax: [phone]     Social Determinants of Health (SDOH) Interventions    Readmission Risk Interventions    06/30/2022    1:12 PM 06/17/2022    1:02 PM 01/22/2022   12:19 PM  Readmission Risk Prevention Plan  Transportation Screening Complete Complete Complete  Medication Review ([place]) Complete Complete Complete  PCP or Specialist appointment within 3-5 days of discharge Complete Complete Complete  HRI or Home Care Consult Complete Complete Complete  SW Recovery Care/Counseling Consult Complete Complete Complete  Palliative Care Screening Not Applicable Not Applicable Not [place] Not Applicable Complete Not Applicable

## 2022-07-27 NOTE — Progress Notes (Signed)
PROGRESS NOTE    Diamond [name]  YNW:[id number] DOB: [birth year] DOA: 07/25/2022 PCP: Diamond Mccreedy, MD    Brief Narrative:  71 year old with history of atrial flutter, ESRD on hemodialysis TTS schedule, hypertension, recently treated for MRSA bacteremia secondary to hemodialysis catheter and completed treatment on 8/8.  Has history of L5-S1 discitis with MRSA bacteremia and endocarditis in January 2023.  She does have chronic mild back pain but since last few days worsening back pain that brought her to ER.  In the emergency room hemodynamically stable.  MRI consistent with discitis and phlegmon.  Neurosurgery and ID consulted.  Admitted to the hospital.   Assessment & Plan:   Suspected discitis:  With history of recurrent MRSA bacteremia endocarditis and discitis presenting with worsening back pain. MRI of the thoracic and lumbar spine 8/21, evaluated by neurosurgery and they think it may be postinflammatory changes.  Chronic inflammatory condition including CRP and ESR within normal limits. Seen by infectious disease, waiting for blood cultures.  If negative blood cultures, plan to treat with oral antibiotics for additional 4 weeks. Additional pain medications, Tylenol, mobility with lumbar corset.  PT OT and inpatient therapies at a SNF.  Atrial flutter: Rate controlled with metoprolol.  Therapeutic on Eliquis.  Continued.  ESRD on hemodialysis: TTS schedule.  Due for dialysis today.  Nephrology consulted.    DVT prophylaxis: apixaban (ELIQUIS) tablet 2.5 mg Start: 07/26/22 2200 apixaban (ELIQUIS) tablet 2.5 mg   Code Status: Full code Family Communication: None Disposition Plan: Status is: Inpatient Remains inpatient appropriate because: Investigation for active infection     Consultants:  Neurosurgery Infectious disease Nephrology  Procedures:  None  Antimicrobials:  None   Subjective: Patient seen in the morning rounds.  She was quite irritated and  agitated.  She complained of back pain and was angry with questioning.  Afebrile.  Discussed about using pain medications and going to dialysis. "I think I am too sick to go to dialysis today".  Objective: Vitals:   07/27/22 0006 07/27/22 0224 07/27/22 0432 07/27/22 0833  BP: (!) 154/80 (!) 197/112 (!) 145/92 (!) 168/94  Pulse: 80 72 67 75  Resp:  _0 Temp:  98.2 F (36.8 C) 98.3 F (36.8 C) 98.4 F (36.9 C)  TempSrc:  Oral Oral   SpO2:  97% 98% 97%   No intake or output data in the 24 hours ending 07/27/22 1139 There were no vitals filed for this visit.  Examination:  General exam: Appears restless , anxious and in bad mood with back pain.  Respiratory system: Clear to auscultation. Respiratory effort normal. Cardiovascular system: S1 & S2 heard, RRR. No pedal edema. Gastrointestinal system: soft, non tender.  Central nervous system: Alert and oriented x1-2. Moves all extremities equally. Extremities: Dialysis fistula present on left upper extremity.   Data Reviewed: I have personally reviewed following labs and imaging studies  CBC: Recent Labs  Lab 07/24/22 2153 07/26/22 0407 07/27/22 0412  WBC 4.4 4.2 3.9*  NEUTROABS 2.8  --   --   HGB 11.1* 10.8* 9.5*  HCT 36.4 35.1* 28.7*  MCV 105.8* 103.5* 97.3  PLT 172 166 657   Basic Metabolic Panel: Recent Labs  Lab 07/24/22 2153 07/26/22 0442 07/27/22 0412  NA 140 141 138  K 3.6 3.6 3.6  CL 100 102 101  CO2 _1 GLUCOSE 98 83 94  BUN 7* 14 20  CREATININE 3.98* 5.91* 6.80*  CALCIUM 8.9 9.6 9.1   GFR:  CrCl cannot be calculated (Unknown ideal weight.). Liver Function Tests: Recent Labs  Lab 07/26/22 0442  AST 13*  ALT 6  ALKPHOS 54  BILITOT 0.8  PROT 6.2*  ALBUMIN 2.7*   Recent Labs  Lab 07/26/22 0442  LIPASE 40   No results for input(s): "AMMONIA" in the last 168 hours. Coagulation Profile: No results for input(s): "INR", "PROTIME" in the last 168 hours. Cardiac Enzymes: No results  for input(s): "CKTOTAL", "CKMB", "CKMBINDEX", "TROPONINI" in the last 168 hours. BNP (last 3 results) No results for input(s): "PROBNP" in the last 8760 hours. HbA1C: No results for input(s): "HGBA1C" in the last 72 hours. CBG: No results for input(s): "GLUCAP" in the last 168 hours. Lipid Profile: No results for input(s): "CHOL", "HDL", "LDLCALC", "TRIG", "CHOLHDL", "LDLDIRECT" in the last 72 hours. Thyroid Function Tests: No results for input(s): "TSH", "T4TOTAL", "FREET4", "T3FREE", "THYROIDAB" in the last 72 hours. Anemia Panel: No results for input(s): "VITAMINB12", "FOLATE", "FERRITIN", "TIBC", "IRON", "RETICCTPCT" in the last 72 hours. Sepsis Labs: No results for input(s): "PROCALCITON", "LATICACIDVEN" in the last 168 hours.  Recent Results (from the past 240 hour(s))  Blood culture (routine single)     Status: None   Collection Time: 07/21/22  4:15 AM   Specimen: Blood  Result Value Ref Range Status   MICRO NUMBER: [id number]  Final   SPECIMEN QUALITY: Adequate  Final   Source BLOOD 1  Final   STATUS: FINAL  Final   Result: No growth after 5 days  Final   COMMENT: Aerobic and anaerobic bottle received.  Final  Blood culture (routine single)     Status: None   Collection Time: 07/21/22  4:23 AM   Specimen: Blood  Result Value Ref Range Status   MICRO NUMBER: [id number]  Final   SPECIMEN QUALITY: Adequate  Final   Source BLOOD 2  Final   STATUS: FINAL  Final   Result: No growth after 5 days  Final   COMMENT: Aerobic and anaerobic bottle received.  Final  Blood culture (routine x 2)     Status: None (Preliminary result)   Collection Time: 07/26/22 11:34 AM   Specimen: BLOOD RIGHT HAND  Result Value Ref Range Status   Specimen Description BLOOD RIGHT HAND  Final   Special Requests   Final    BOTTLES DRAWN AEROBIC AND ANAEROBIC Blood Culture adequate volume   Culture   Final    NO GROWTH < 24 HOURS Performed at [facility], [place] [address] [place],   [address]    Report Status PENDING  Incomplete         Radiology Studies: MR LUMBAR SPINE WO CONTRAST  Result Date: 07/26/2022 CLINICAL DATA:  Low back pain, infection suspected, positive xray/CT acute on chronic back pain, recent fall, hx/o discitis not on abx now with BLE weakness, right greater than left EXAM: MRI LUMBAR SPINE WITHOUT CONTRAST TECHNIQUE: Multiplanar, multisequence MR imaging of the lumbar spine was performed. No intravenous contrast was administered. COMPARISON:  CT of the lumbar spine 07/25/2022. MRI of the lumbar spine 12/18/2021. FINDINGS: Segmentation: Transitional lumbosacral anatomy with sacralized L5 segment and rudimentary L5-S1 disc. Alignment:  Similar grade 1 anterolisthesis at L3-L4. Vertebrae: Fracture of the L3 superior endplate better characterized on recent CT. There is associated bone marrow edema, compatible with recent/healing fracture. There is edema within the adjacent L2-L3 disc. Improved L4-L5 edema with surrounding bone marrow edema. Conus medullaris and cauda equina: Conus extends to the L1 level. Conus appears  normal. Paraspinal and other soft tissues: Paravertebral edema centered at L2-L3 including anterior paraspinal soft tissue thickening and edema. No discrete, drainable fluid collection; however, the absence of contrast limits assessment. Many cysts within both kidneys. Disc levels: Similar multilevel degenerative change, including moderate to severe canal stenosis L3-L4 and L4-L5 with areas of subarticular recess stenosis. Similar multilevel foraminal narrowing, which appears greatest on the left at L4-L5. IMPRESSION: 1. Transitional lumbosacral anatomy with sacralized L5 segment and rudimentary L5-S1 disc. Correlation with radiographs is recommended prior to any operative intervention. 2. Recent/healing L3 fracture as described on recent CT with edema in the L2-L3 disc and superimposed surrounding paravertebral edema, which is nonspecific but raises  concern for superimposed discitis with surrounding phlegmon/infection (particularly given below finding and reported clinical concern). Posttraumatic inflammatory change/edema is a consideration but thought less likely. No discrete, drainable fluid collection identified; however, the absence of contrast limits assessment. If the patient is able, recommend post-contrast imaging. 3. Improved L4-L5 disc and surrounding bone marrow edema, possibly representing improving discitis/osteomyelitis at this level. Degenerative change is a differential consideration. 4. Similar superimposed multilevel degenerative change, including moderate to severe canal stenosis at L3-L4 and L4-L5. Electronically Signed   By: Margaretha Sheffield M.D.   On: 07/26/2022 10:21   MR THORACIC SPINE WO CONTRAST  Result Date: 07/26/2022 CLINICAL DATA:  Mid-back pain, neuro deficit EXAM: MRI THORACIC SPINE WITHOUT CONTRAST TECHNIQUE: Multiplanar, multisequence MR imaging of the thoracic spine was performed. No intravenous contrast was administered. COMPARISON:  MRI thoracic spine 12/18/2021. FINDINGS: Alignment: Mildly exaggerated thoracic kyphosis. No substantial sagittal subluxation. Vertebrae: Mildly heterogeneous bone marrow without suspicious bone lesion. Scattered benign vertebral venous malformations. Endplate signal changes at multiple levels are most characteristic of degenerative/discogenic endplate signal change. Otherwise, no marrow edema to suggest acute fracture or discitis/osteomyelitis. Cord:  Normal cord signal. Paraspinal and other soft tissues: Partially imaged left renal cysts. Disc levels: Small disc bulges at multiple levels without significant canal stenosis. No significant foraminal stenosis. IMPRESSION: No evidence of acute abnormality or significant canal/foraminal stenosis. Electronically Signed   By: Margaretha Sheffield M.D.   On: 07/26/2022 09:53   CT Lumbar Spine Wo Contrast  Result Date: 07/25/2022 CLINICAL DATA:   Low back pain starting 2 days ago EXAM: CT LUMBAR SPINE WITHOUT CONTRAST TECHNIQUE: Multidetector CT imaging of the lumbar spine was performed without intravenous contrast administration. Multiplanar CT image reconstructions were also generated. RADIATION DOSE REDUCTION: This exam was performed according to the departmental dose-optimization program which includes automated exposure control, adjustment of the mA and/or kV according to patient size and/or use of iterative reconstruction technique. COMPARISON:  CT abdomen/pelvis 07/27/2022, 06/12/2022, lumbar spine MRI 12/18/2021 FINDINGS: Segmentation: Transitional anatomy is again seen with sacralization of the L5 vertebral body. There is a rudimentary L5-S1 disc. This numbering convention is keeping with the prior lumbar spine MRI from January. Alignment: There is straightening of the normal lumbar lordosis. Grade 1 anterolisthesis of L3 on L4 is unchanged. Vertebrae: There is indentation of the superior L3 endplate by a prominent Schmorl's node, unchanged. There is compression deformity of the L3 vertebral body with up to approximately 25% loss of vertebral body height anteriorly. A fracture plane extends from the anterior endplate to the posterior endplate with involvement of the superior endplate with mild bony retropulsion resulting moderate severe spinal canal stenosis. The fracture was not present on the study from 01/16/2022 and was likely acute on the study from 06/12/2022 but has progressed since that study. There is marked  endplate irregularity along the L4-L5 disc space which is similar to the prior study and likely reflects sequela of discitis/osteomyelitis. The other vertebral body heights are preserved. Paraspinal and other soft tissues: Nonobstructing right renal stones are seen. Simple bilateral renal cysts are seen for which no specific imaging follow-up is required. There is no definite psoas abscess, within the confines of noncontrast technique.  Disc levels: T12-L1 and L1-L2: No significant spinal canal or neural foraminal stenosis. L2-L3: Mild disc bulge without significant spinal canal or neural foraminal stenosis L2-L3: There is a disc bulge and mild bilateral facet arthropathy with superimposed bony retropulsion of the posterior L3 endplate resulting in moderate to severe spinal canal stenosis. No high-grade neural foraminal stenosis. L3-L4: There is grade 1 anterolisthesis with diffuse disc bulge and bilateral facet arthropathy with ligamentum flavum thickening resulting in moderate to severe spinal canal stenosis and mild bilateral neural foraminal stenosis L4-L5: There is endplate spurring and bilateral facet arthropathy with ligamentum flavum thickening resulting in moderate to severe spinal canal stenosis and mild-to-moderate left and mild right neural foraminal stenosis. L5-S1: No significant spinal canal or neural foraminal stenosis. IMPRESSION: 1. Subacute compression fracture of the L3 vertebral body with a fracture plane extending from the anterior to the posterior endplate with involvement of the superior endplate. The fracture was likely acute on the study from 06/12/2022 and has progressed since that study. Mild bony retropulsion at this level results in moderate to severe spinal canal stenosis. 2. Endplate irregularity along the L4-L5 disc space likely reflects sequela of discitis/osteomyelitis, similar to the prior study. 3. Moderate to severe spinal canal stenosis at L3-L4 and L4-L5, similar to the prior MRI. Electronically Signed   By: Valetta Mole M.D.   On: 07/25/2022 19:30        Scheduled Meds:  apixaban  2.5 mg Oral BID   Chlorhexidine Gluconate Cloth  6 each Topical Q0600   metoprolol tartrate  50 mg Oral BID   mometasone-formoterol  2 puff Inhalation BID   pantoprazole  40 mg Oral BID   sevelamer carbonate  1,600 mg Oral TID WC   Continuous Infusions:   LOS: 1 day    Time spent: 35 minutes     Barb Merino, MD [facility] Pager [phone]

## 2022-07-28 DIAGNOSIS — M4647 Discitis, unspecified, lumbosacral region: Secondary | ICD-10-CM | POA: Diagnosis not present

## 2022-07-28 LAB — HEPATITIS B SURFACE ANTIBODY, QUANTITATIVE: Hep B S AB Quant (Post): 6.5 m[IU]/mL — ABNORMAL LOW (ref >9.9)

## 2022-07-28 LAB — HEMOGLOBIN A1C
Hgb A1c MFr Bld: <4.2 % — ABNORMAL LOW (ref 4.8–5.6)
Mean Plasma Glucose: <74 mg/dL

## 2022-07-28 MED ORDER — OXYCODONE HCL 5 MG PO TABS: 5.0000 mg | ORAL_TABLET | Freq: Two times a day (BID) | ORAL | 0 refills | Status: AC | PRN

## 2022-07-28 MED ORDER — SULFAMETHOXAZOLE-TRIMETHOPRIM 800-160 MG PO TABS: 1.0000 | ORAL_TABLET | Freq: Every day | ORAL | 0 refills | Status: AC

## 2022-07-28 MED ORDER — LIDOCAINE 4 % EX PTCH: 1.0000 | MEDICATED_PATCH | Freq: Every day | CUTANEOUS | Status: DC | PRN

## 2022-07-28 MED ORDER — CHLORHEXIDINE GLUCONATE CLOTH 2 % EX PADS
6.0000 | MEDICATED_PAD | Freq: Every day | CUTANEOUS | Status: DC
  Administered 2022-07-29: 6 via TOPICAL

## 2022-07-28 NOTE — Progress Notes (Signed)
PROGRESS NOTE    Diamond [name]  OFH:[id number] DOB: February 25, 1951 DOA: 07/25/2022 PCP: [name], MD    Brief Narrative:  71 year old with history of atrial flutter, ESRD on hemodialysis TTS schedule, hypertension, recently treated for MRSA bacteremia secondary to hemodialysis catheter and completed treatment on 8/8.  Has history of L5-S1 discitis with MRSA bacteremia and endocarditis in January 2023.  She does have chronic mild back pain but since last few days worsening back pain that brought her to ER.  In the emergency room hemodynamically stable.  MRI consistent with discitis and phlegmon.  Neurosurgery and ID consulted.  Admitted to the hospital.   Assessment & Plan:   Suspected discitis: Ruled out abscess.  Neurosurgery advised this was postinflammatory findings. With history of recurrent MRSA bacteremia endocarditis and discitis presenting with worsening back pain. MRI of the thoracic and lumbar spine 8/21, evaluated by neurosurgery and they think it may be postinflammatory changes.  Chronic inflammatory condition including CRP and ESR within normal limits. Seen by infectious disease, blood cultures negative so far.  plan to treat with oral antibiotics for additional 4 weeks. Additional pain medications, Tylenol, mobility with lumbar corset.  PT OT and inpatient therapies at a SNF.  Atrial flutter: Rate controlled with metoprolol.  Therapeutic on Eliquis.  Continued.  ESRD on hemodialysis: TTS schedule.  Receiving dialysis on her schedule.  Nephrology consulted.  Medically stable to transfer to SNF level of care.    DVT prophylaxis: apixaban (ELIQUIS) tablet 2.5 mg Start: 07/26/22 2200 apixaban (ELIQUIS) tablet 2.5 mg   Code Status: Full code Family Communication: None Disposition Plan: Status is: Inpatient Remains inpatient appropriate because: Needs a skilled nursing facility.     Consultants:  Neurosurgery Infectious disease Nephrology  Procedures:   None  Antimicrobials:  Bactrim 8/22---   Subjective:  Patient seen and examined.  Pleasantly confused.  Denies any complaints.  Denies any back pain.  She used 1 dose of Ativan yesterday afternoon and since then did not need any medications.  No family at bedside.  Agreeable to rehab.  Objective: Vitals:   07/28/22 0212 07/28/22 0402 07/28/22 0500 07/28/22 0723  BP: (!) 114/94 (!) 140/90  (!) 146/102  Pulse: 77 80  82  Resp: (!) '24 16  18  ' Temp:  98.3 F (36.8 C)  98.3 F (36.8 C)  TempSrc:  Oral  Oral  SpO2: 98% 100%  100%  Weight:   55.3 kg     Intake/Output Summary (Last 24 hours) at 07/28/2022 1052 Last data filed at 07/28/2022 0212 Gross per 24 hour  Intake --  Output 3000 ml  Net -3000 ml   Filed Weights   07/27/22 2118 07/28/22 0500  Weight: 56.6 kg 55.3 kg    Examination:  General exam: Appears anxious but quiet and composed.  Looks comfortable. Respiratory system: Clear to auscultation. Respiratory effort normal. Cardiovascular system: S1 & S2 heard, RRR. No pedal edema. Gastrointestinal system: soft, non tender.  Central nervous system: Alert and oriented x1-2. Moves all extremities equally. Extremities: Dialysis fistula present on left upper extremity.   Data Reviewed: I have personally reviewed following labs and imaging studies  CBC: Recent Labs  Lab 07/24/22 2153 07/26/22 0407 07/27/22 0412  WBC 4.4 4.2 3.9*  NEUTROABS 2.8  --   --   HGB 11.1* 10.8* 9.5*  HCT 36.4 35.1* 28.7*  MCV 105.8* 103.5* 97.3  PLT 172 166 549   Basic Metabolic Panel: Recent Labs  Lab 07/24/22 2153 07/26/22 0442 07/27/22  0412  NA 140 141 138  K 3.6 3.6 3.6  CL 100 102 101  CO2 '28 27 23  ' GLUCOSE 98 83 94  BUN 7* 14 20  CREATININE 3.98* 5.91* 6.80*  CALCIUM 8.9 9.6 9.1  PHOS  --   --  5.6*   GFR: Estimated Creatinine Clearance: 6.5 mL/min (A) (by C-G formula based on SCr of 6.8 mg/dL (H)). Liver Function Tests: Recent Labs  Lab 07/26/22 0442  AST  13*  ALT 6  ALKPHOS 54  BILITOT 0.8  PROT 6.2*  ALBUMIN 2.7*   Recent Labs  Lab 07/26/22 0442  LIPASE 40   No results for input(s): "AMMONIA" in the last 168 hours. Coagulation Profile: No results for input(s): "INR", "PROTIME" in the last 168 hours. Cardiac Enzymes: No results for input(s): "CKTOTAL", "CKMB", "CKMBINDEX", "TROPONINI" in the last 168 hours. BNP (last 3 results) No results for input(s): "PROBNP" in the last 8760 hours. HbA1C: Recent Labs    07/27/22 0412  HGBA1C <4.2*   CBG: No results for input(s): "GLUCAP" in the last 168 hours. Lipid Profile: No results for input(s): "CHOL", "HDL", "LDLCALC", "TRIG", "CHOLHDL", "LDLDIRECT" in the last 72 hours. Thyroid Function Tests: No results for input(s): "TSH", "T4TOTAL", "FREET4", "T3FREE", "THYROIDAB" in the last 72 hours. Anemia Panel: No results for input(s): "VITAMINB12", "FOLATE", "FERRITIN", "TIBC", "IRON", "RETICCTPCT" in the last 72 hours. Sepsis Labs: No results for input(s): "PROCALCITON", "LATICACIDVEN" in the last 168 hours.  Recent Results (from the past 240 hour(s))  Blood culture (routine single)     Status: None   Collection Time: 07/21/22  4:15 AM   Specimen: Blood  Result Value Ref Range Status   MICRO NUMBER: [id number]  Final   SPECIMEN QUALITY: Adequate  Final   Source BLOOD 1  Final   STATUS: FINAL  Final   Result: No growth after 5 days  Final   COMMENT: Aerobic and anaerobic bottle received.  Final  Blood culture (routine single)     Status: None   Collection Time: 07/21/22  4:23 AM   Specimen: Blood  Result Value Ref Range Status   MICRO NUMBER: [id number]  Final   SPECIMEN QUALITY: Adequate  Final   Source BLOOD 2  Final   STATUS: FINAL  Final   Result: No growth after 5 days  Final   COMMENT: Aerobic and anaerobic bottle received.  Final  Blood culture (routine x 2)     Status: None (Preliminary result)   Collection Time: 07/26/22 11:34 AM   Specimen: BLOOD RIGHT HAND  Result  Value Ref Range Status   Specimen Description BLOOD RIGHT HAND  Final   Special Requests   Final    BOTTLES DRAWN AEROBIC AND ANAEROBIC Blood Culture adequate volume   Culture   Final    NO GROWTH 2 DAYS Performed at [facility], [address] [place], [place] [address]    Report Status PENDING  Incomplete  Blood culture (routine x 2)     Status: None (Preliminary result)   Collection Time: 07/27/22  4:12 AM   Specimen: BLOOD RIGHT HAND  Result Value Ref Range Status   Specimen Description BLOOD RIGHT HAND  Final   Special Requests   Final    BOTTLES DRAWN AEROBIC AND ANAEROBIC Blood Culture adequate volume   Culture   Final    NO GROWTH 1 DAY Performed at [facility], [place] [address] [place], [place] [address]    Report Status PENDING  Incomplete         Radiology Studies: No results found.      Scheduled Meds:  apixaban  2.5 mg Oral BID   Chlorhexidine Gluconate Cloth  6 each Topical Q0600   metoprolol tartrate  50 mg Oral BID   mometasone-formoterol  2 puff Inhalation BID   multivitamin  1 tablet Oral QHS   pantoprazole  40 mg Oral BID   sevelamer carbonate  1,600 mg Oral TID WC   sulfamethoxazole-trimethoprim  1 tablet Oral QHS   Continuous Infusions:   LOS: 2 days    Time spent: 35 minutes     Barb Merino, MD [facility] Pager [phone]

## 2022-07-28 NOTE — Progress Notes (Signed)
[facility] Progress Note  Subjective: seen in room, no c/o's.   Vitals:   07/28/22 0402 07/28/22 0500 07/28/22 0723 07/28/22 1133  BP: (!) 140/90  (!) 146/102 (!) 128/90  Pulse: 80  82 79  Resp: '16  18 18  '$ Temp: 98.3 F (36.8 C)  98.3 F (36.8 C) 98.1 F (36.7 C)  TempSrc: Oral  Oral Oral  SpO2: 100%  100% 99%  Weight:  55.3 kg      Exam: Gen alert, no distress, pleasant No jvd or bruits Chest clear bilat to bases RRR no MRG Abd soft ntnd no mass or ascites +bs Ext no LE edema Neuro is alert, Ox 3 , nf     LUA AVF+bruit     Home meds include - albuterol, apixaban, fluticasone-salmeterol, metoprolol 50 bid, pantoprazole, sevelamer carbonate 2 ac, docusate sodium, ondansteron prn, oxycodone IR prn, prns/ vits/ supps    OP HD: [place] TTS 4h  400/600  58 kg  2/2 bath  LUE AVF  Hep none  - hep B labs 8/22: nonimmune - needs updating further   Assessment/ Plan: Back pain - MRI showing L5/S1 discitis. Pt had admit in Jan for similar discitis along w/ endocarditis/ MRSA bacteremia. She also had recent admit in July for MRSA bacteremia felt to be [facility] related. TDC was removed and her AVF was used, abx were completed on 8/8.  Getting IV abx here again, cx's pending.  ESRD - on HD TTS. Had HD here yesterday. HD tomorrow.  HTN /vol - BP's on the high side, tolerated 3 L UF w/ HD yesterday. Cont home meds.  Atrial flutter - taking eliquis, BB Anemia esrd - Hb 9- 11 here Transfuse prn.  MBD ckd - CCa in range, phos in range. Cont renvela as binder     Rob Jovonta Levit 07/28/2022, 1:18 PM   Recent Labs  Lab 07/26/22 0407 07/26/22 0442 07/27/22 0412  HGB 10.8*  --  9.5*  ALBUMIN  --  2.7*  --   CALCIUM  --  9.6 9.1  PHOS  --   --  5.6*  CREATININE  --  5.91* 6.80*  K  --  3.6 3.6   No results for input(s): "IRON", "TIBC", "FERRITIN" in the last 168 hours. Inpatient medications:  apixaban  2.5 mg Oral BID   Chlorhexidine Gluconate Cloth  6 each Topical Q0600    metoprolol tartrate  50 mg Oral BID   mometasone-formoterol  2 puff Inhalation BID   multivitamin  1 tablet Oral QHS   pantoprazole  40 mg Oral BID   sevelamer carbonate  1,600 mg Oral TID WC   sulfamethoxazole-trimethoprim  1 tablet Oral QHS    acetaminophen **OR** acetaminophen, albuterol, calcium carbonate (dosed in mg elemental calcium), camphor-menthol **AND** hydrOXYzine, cyclobenzaprine, docusate sodium, hydrALAZINE, lidocaine, LORazepam, ondansetron **OR** ondansetron (ZOFRAN) IV, oxyCODONE, sorbitol, zolpidem

## 2022-07-28 NOTE — Progress Notes (Signed)
Occupational Therapy Treatment Patient Details Name: Diamond [name] MRN: [id number] DOB: November 12, 1951 Today's Date: 07/28/2022   History of present illness Pt is a 71 y/o F presenting with worsening back pain - MRI revealed discitis with phlegmon. PMH includes L5-S1 discitis with MRSA bacteremia and endocarditis in 12/2021 and from 7/8-7/26, aflutter; ESRD on TTS HD, and HTN   OT comments  Pt progressing towards goals, son present during session and pt seems to participate better with family present. Pt able to walk short distance to door and back to bed with min A +2, pt min-max A for bed mobility and min-max A for ADLs. Pt perseverating on wanting to "lay down" throughout session. Pt presenting with impairments listed below, will follow acutely. Continue to recommend SNF at d/c unless pt is able to have 24/7 assistance at home.    Recommendations for follow up therapy are one component of a multi-disciplinary discharge planning process, led by the attending physician.  Recommendations may be updated based on patient status, additional functional criteria and insurance authorization.    Follow Up Recommendations  Skilled nursing-short term rehab (<3 hours/day)    Assistance Recommended at Discharge Frequent or constant Supervision/Assistance  Patient can return home with the following  A lot of help with walking and/or transfers;A lot of help with bathing/dressing/bathroom;Assistance with cooking/housework;Direct supervision/assist for medications management;Help with stairs or ramp for entrance;Assist for transportation;Direct supervision/assist for financial management   Equipment Recommendations  BSC/3in1    Recommendations for Other Services PT consult    Precautions / Restrictions Precautions Precautions: Fall;Back Required Braces or Orthoses: Spinal Brace Spinal Brace: Lumbar corset Other Brace: lumbar corsett when OOB Restrictions Weight Bearing Restrictions: No        Mobility Bed Mobility Overal bed mobility: Needs Assistance Bed Mobility: Sidelying to Sit, Sit to Sidelying   Sidelying to sit: Min assist     Sit to sidelying: Mod assist, +2 for physical assistance General bed mobility comments: pt with difficulty following command to return to sidelying, needing increased assistance for return to bed    Transfers Overall transfer level: Needs assistance Equipment used: Rolling walker (2 wheels) Transfers: Sit to/from Stand, Bed to chair/wheelchair/BSC Sit to Stand: Min assist                 Balance Overall balance assessment: Needs assistance Sitting-balance support: Bilateral upper extremity supported, Feet supported Sitting balance-Leahy Scale: Fair     Standing balance support: During functional activity, Reliant on assistive device for balance Standing balance-Leahy Scale: Poor                             ADL either performed or assessed with clinical judgement   ADL Overall ADL's : Needs assistance/impaired                 Upper Body Dressing : Maximal assistance;Sitting Upper Body Dressing Details (indicate cue type and reason): to don gown/back brace Lower Body Dressing: Maximal assistance;Bed level Lower Body Dressing Details (indicate cue type and reason): to don socks Toilet Transfer: Minimal assistance;+2 for physical assistance;Ambulation;Regular Glass blower/designer Details (indicate cue type and reason): simulated in room         Functional mobility during ADLs: Minimal assistance;+2 for physical assistance;Rolling walker (2 wheels)      Extremity/Trunk Assessment Upper Extremity Assessment Upper Extremity Assessment: Generalized weakness   Lower Extremity Assessment Lower Extremity Assessment: Defer to PT evaluation  Vision   Additional Comments: will further assess   Perception Perception Perception: Not tested   Praxis Praxis Praxis: Not tested    Cognition  Arousal/Alertness: Lethargic Behavior During Therapy: Restless Overall Cognitive Status: Impaired/Different from baseline Area of Impairment: Following commands, Awareness, Problem solving, Attention                   Current Attention Level: Selective   Following Commands: Follows one step commands inconsistently   Awareness: Emergent Problem Solving: Slow processing, Requires verbal cues, Requires tactile cues General Comments: continues to ask "why she needs to get up, despite explanation        Exercises      Shoulder Instructions       General Comments VSS on RA    Pertinent Vitals/ Pain       Pain Assessment Pain Assessment: No/denies pain  Home Living                                          Prior Functioning/Environment              Frequency  Min 2X/week        Progress Toward Goals  OT Goals(current goals can now be found in the care plan section)  Progress towards OT goals: Progressing toward goals  Acute Rehab OT Goals Patient Stated Goal: to lay down OT Goal Formulation: With patient Time For Goal Achievement: 08/10/22 Potential to Achieve Goals: Good ADL Goals Pt Will Perform Upper Body Dressing: with min assist;sitting Pt Will Perform Lower Body Dressing: with mod assist;with adaptive equipment;sit to/from stand;sitting/lateral leans Pt Will Transfer to Toilet: with min assist;ambulating;regular height toilet Additional ADL Goal #1: pt will complete bed mobility independently in prep for ADLs  Plan Discharge plan remains appropriate;Frequency remains appropriate    Co-evaluation                 AM-PAC OT "6 Clicks" Daily Activity     Outcome Measure   Help from another person eating meals?: None Help from another person taking care of personal grooming?: A Little Help from another person toileting, which includes using toliet, bedpan, or urinal?: A Lot Help from another person bathing (including  washing, rinsing, drying)?: A Lot Help from another person to put on and taking off regular upper body clothing?: A Lot Help from another person to put on and taking off regular lower body clothing?: A Lot 6 Click Score: 15    End of Session Equipment Utilized During Treatment: Gait belt;Rolling walker (2 wheels);Back brace  OT Visit Diagnosis: Unsteadiness on feet (R26.81);Other abnormalities of gait and mobility (R26.89);Muscle weakness (generalized) (M62.81)   Activity Tolerance Patient tolerated treatment well   Patient Left in bed;with call bell/phone within reach;with bed alarm set;with family/visitor present   Nurse Communication Mobility status        Time: 1610-9604 OT Time Calculation (min): 27 min  Charges: OT General Charges $OT Visit: 1 Visit OT Treatments $Self Care/Home Management : 8-22 mins $Therapeutic Activity: 8-22 mins  Lynnda Child, OTD, OTR/L Acute Rehab [phone]) [phone] - [place] 07/28/2022, 10:57 AM

## 2022-07-28 NOTE — Plan of Care (Signed)
Id brief note   Recent bcx negative Repeat bcx ngtd   Discussed with primary team Ok to dc on bactrim as planned with id f/u

## 2022-07-28 NOTE — TOC Progression Note (Signed)
Transition of Care [facility]) - Progression Note    Patient Details  Name: Diamond [name] MRN: [id number] Date of Birth: [birth year]  Transition of Care [facility]) CM/SW [place], [place] Phone Number: 07/28/2022, 3:32 PM  Clinical Narrative:   CSW updated by Ronney Lion that patient's HD will have to move to MWF at [facility] in order for them to offer. CSW asked Renal Navigator about moving HD, but [facility] does not have a MWF slot available. CSW notified [facility], they will be unable to accept. CSW confirmed with [facility] that they still had a bed available, and updated daughter [name] via phone, who is in agreement. CSW requested insurance authorization. CSW to follow.    Expected Discharge Plan: [place] Barriers to Discharge: Continued Medical Work up, Ship broker  Expected Discharge Plan and Services Expected Discharge Plan: [place] Choice: [place] Living arrangements for the past 2 months: Single Family Home                                       Social Determinants of Health (SDOH) Interventions    Readmission Risk Interventions    06/30/2022    1:12 PM 06/17/2022    1:02 PM 01/22/2022   12:19 PM  Readmission Risk Prevention Plan  Transportation Screening Complete Complete Complete  Medication Review Press photographer) Complete Complete Complete  PCP or Specialist appointment within 3-5 days of discharge Complete Complete Complete  HRI or Home Care Consult Complete Complete Complete  SW Recovery Care/Counseling Consult Complete Complete Complete  Palliative Care Screening Not Applicable Not Applicable Not [place] Not Applicable Complete Not Applicable

## 2022-07-29 ENCOUNTER — Ambulatory Visit: Payer: Self-pay | Admitting: *Deleted

## 2022-07-29 DIAGNOSIS — B9562 Methicillin resistant Staphylococcus aureus infection as the cause of diseases classified elsewhere: Secondary | ICD-10-CM | POA: Diagnosis not present

## 2022-07-29 DIAGNOSIS — N2581 Secondary hyperparathyroidism of renal origin: Secondary | ICD-10-CM | POA: Diagnosis not present

## 2022-07-29 DIAGNOSIS — M4647 Discitis, unspecified, lumbosacral region: Secondary | ICD-10-CM | POA: Diagnosis not present

## 2022-07-29 DIAGNOSIS — I4892 Unspecified atrial flutter: Secondary | ICD-10-CM | POA: Diagnosis not present

## 2022-07-29 DIAGNOSIS — I10 Essential (primary) hypertension: Secondary | ICD-10-CM | POA: Diagnosis not present

## 2022-07-29 DIAGNOSIS — M4640 Discitis, unspecified, site unspecified: Secondary | ICD-10-CM | POA: Diagnosis not present

## 2022-07-29 DIAGNOSIS — K219 Gastro-esophageal reflux disease without esophagitis: Secondary | ICD-10-CM | POA: Diagnosis not present

## 2022-07-29 DIAGNOSIS — M549 Dorsalgia, unspecified: Secondary | ICD-10-CM | POA: Diagnosis not present

## 2022-07-29 DIAGNOSIS — Z992 Dependence on renal dialysis: Secondary | ICD-10-CM | POA: Diagnosis not present

## 2022-07-29 DIAGNOSIS — J45909 Unspecified asthma, uncomplicated: Secondary | ICD-10-CM | POA: Diagnosis not present

## 2022-07-29 DIAGNOSIS — E119 Type 2 diabetes mellitus without complications: Secondary | ICD-10-CM | POA: Diagnosis not present

## 2022-07-29 DIAGNOSIS — D631 Anemia in chronic kidney disease: Secondary | ICD-10-CM | POA: Diagnosis not present

## 2022-07-29 DIAGNOSIS — I120 Hypertensive chronic kidney disease with stage 5 chronic kidney disease or end stage renal disease: Secondary | ICD-10-CM | POA: Diagnosis not present

## 2022-07-29 DIAGNOSIS — E440 Moderate protein-calorie malnutrition: Secondary | ICD-10-CM | POA: Diagnosis not present

## 2022-07-29 DIAGNOSIS — N390 Urinary tract infection, site not specified: Secondary | ICD-10-CM | POA: Diagnosis not present

## 2022-07-29 DIAGNOSIS — M898X9 Other specified disorders of bone, unspecified site: Secondary | ICD-10-CM | POA: Diagnosis not present

## 2022-07-29 DIAGNOSIS — I5189 Other ill-defined heart diseases: Secondary | ICD-10-CM | POA: Diagnosis not present

## 2022-07-29 DIAGNOSIS — R2689 Other abnormalities of gait and mobility: Secondary | ICD-10-CM | POA: Diagnosis not present

## 2022-07-29 DIAGNOSIS — N250 Renal osteodystrophy: Secondary | ICD-10-CM | POA: Diagnosis not present

## 2022-07-29 DIAGNOSIS — D649 Anemia, unspecified: Secondary | ICD-10-CM | POA: Diagnosis not present

## 2022-07-29 DIAGNOSIS — R7881 Bacteremia: Secondary | ICD-10-CM | POA: Diagnosis not present

## 2022-07-29 DIAGNOSIS — S32030D Wedge compression fracture of third lumbar vertebra, subsequent encounter for fracture with routine healing: Secondary | ICD-10-CM | POA: Diagnosis not present

## 2022-07-29 DIAGNOSIS — M4620 Osteomyelitis of vertebra, site unspecified: Secondary | ICD-10-CM | POA: Diagnosis not present

## 2022-07-29 DIAGNOSIS — M6281 Muscle weakness (generalized): Secondary | ICD-10-CM | POA: Diagnosis not present

## 2022-07-29 DIAGNOSIS — N186 End stage renal disease: Secondary | ICD-10-CM | POA: Diagnosis not present

## 2022-07-29 DIAGNOSIS — Z7401 Bed confinement status: Secondary | ICD-10-CM | POA: Diagnosis not present

## 2022-07-29 DIAGNOSIS — L853 Xerosis cutis: Secondary | ICD-10-CM | POA: Diagnosis not present

## 2022-07-29 DIAGNOSIS — B1920 Unspecified viral hepatitis C without hepatic coma: Secondary | ICD-10-CM | POA: Diagnosis not present

## 2022-07-29 DIAGNOSIS — E559 Vitamin D deficiency, unspecified: Secondary | ICD-10-CM | POA: Diagnosis not present

## 2022-07-29 LAB — MRSA NEXT GEN BY PCR, NASAL: MRSA by PCR Next Gen: NOT DETECTED

## 2022-07-29 NOTE — Progress Notes (Signed)
Report given to [facility] to Nurse Broadus John.All questions answered. Transported via [employer]

## 2022-07-29 NOTE — Hospital Course (Addendum)
70-yOF w/ history of atrial flutter, ESRD on  HD TTS, hypertension, recently treated for MRSA bacteremia 2/2 to HD catheter and completed treatment on 07/13/22 w/ history of L5-S1 discitis with MRSA bacteremia and endocarditis in January 2023 who has does have chronic mild back pain but since last few days worsening back pain that brought her to ER.In the ED- had MRI consistent with discitis and phlegmon.Neurosurgery and ID consulted and admitted for further management.Patient underwent MRI of the thoracic and lumbar spine 8/21, seen by neurosurgery they feel it may be postinflammatory changes, with chronic inflammatory condition including C RP, ESR within normal limits, seen by ID blood culture have been negative so far advise antibiotics x4 weeks orally. At this time patient medically stable waiting for placement to skilled nursing facility. Patient has a skilled nursing facility arranged for 8/24

## 2022-07-29 NOTE — Progress Notes (Signed)
Subjective: Patient is one month s/p open radical cholecystomy. Seen in hospital today for her postop follow up. She was admitted a few days ago with back pain and found to have discitis. She endorses some abdominal pain, no vomiting. Having regular bowel movements. She was seen in the ED on 8/2 with abdominal pain, and a CT showed postop changes. LFTs are normal.   Objective: Vital signs in last 24 hours: Temp:  [95.1 F (35.1 C)-98.8 F (37.1 C)] 98.7 F (37.1 C) (08/24 0757) Pulse Rate:  [70-86] 72 (08/24 0757) Resp:  [16-18] 18 (08/24 0757) BP: (128-212)/(90-114) 183/110 (08/24 0757) SpO2:  [98 %-100 %] 100 % (08/24 0757)    Intake/Output from previous day: No intake/output data recorded. Intake/Output this shift: No intake/output data recorded.  PE: General: resting comfortably, NAD Neuro: alert and oriented, no focal deficits Resp: normal work of breathing on room air Abdomen: soft, nondistended, nontender to palpation. Midline incision is well-healed with no erythema, induration or drainage. Extremities: warm and well-perfused   Lab Results:  Recent Labs    07/27/22 0412  WBC 3.9*  HGB 9.5*  HCT 28.7*  PLT 163   BMET Recent Labs    07/27/22 0412  NA 138  K 3.6  CL 101  CO2 23  GLUCOSE 94  BUN 20  CREATININE 6.80*  CALCIUM 9.1   PT/INR No results for input(s): "LABPROT", "INR" in the last 72 hours. CMP     Component Value Date/Time   NA 138 07/27/2022 0412   K 3.6 07/27/2022 0412   CL 101 07/27/2022 0412   CO2 23 07/27/2022 0412   GLUCOSE 94 07/27/2022 0412   BUN 20 07/27/2022 0412   CREATININE 6.80 (H) 07/27/2022 0412   CREATININE 5.76 (H) 12/31/2020 1456   CALCIUM 9.1 07/27/2022 0412   PROT 6.2 (L) 07/26/2022 0442   ALBUMIN 2.7 (L) 07/26/2022 0442   AST 13 (L) 07/26/2022 0442   ALT 6 07/26/2022 0442   ALKPHOS 54 07/26/2022 0442   BILITOT 0.8 07/26/2022 0442   GFRNONAA 6 (L) 07/27/2022 0412   GFRAA 14 (L) 11/12/2019 0344    Lipase     Component Value Date/Time   LIPASE 40 07/26/2022 0442       Studies/Results: No results found.  Anti-infectives: Anti-infectives (From admission, onward)    Start     Dose/Rate Route Frequency Ordered Stop   07/28/22 0000  sulfamethoxazole-trimethoprim (BACTRIM DS) 800-160 MG tablet        1 tablet Oral Daily at bedtime 07/28/22 0814 08/25/22 2359   07/27/22 2200  sulfamethoxazole-trimethoprim (BACTRIM DS) 800-160 MG per tablet 1 tablet        1 tablet Oral Daily at bedtime 07/27/22 1235 08/24/22 2359   07/26/22 1445  vancomycin (VANCOREADY) IVPB 1500 mg/300 mL  Status:  Discontinued        1,500 mg 150 mL/hr over 120 Minutes Intravenous  Once 07/26/22 1433 07/26/22 1439   07/26/22 1445  ceFEPIme (MAXIPIME) 2 g in sodium chloride 0.9 % 100 mL IVPB        2 g 200 mL/hr over 30 Minutes Intravenous  Once 07/26/22 1433 07/26/22 1752   07/26/22 1445  vancomycin (VANCOREADY) IVPB 1500 mg/300 mL        1,500 mg 150 mL/hr over 120 Minutes Intravenous  Once 07/26/22 1439 07/27/22 0251        Assessment/Plan This is a 71 yo female 1 month s/p open radical cholecystectomy on  06/25/22. I reviewed her ED scan from a few weeks ago, which shows some fluid around the porta that is likely hematoma, as she had a portal lymph dissection and was oozy during the case. She does not currently have any acute postoperative issues and her incision is healing well. I will reschedule her follow up with me in the office in 1 month.      LOS: 3 days    Michaelle Birks, MD [facility] Surgery General, Hepatobiliary and Pancreatic Surgery 07/29/22 8:50 AM

## 2022-07-29 NOTE — Progress Notes (Signed)
Contacted by CSW that pt to be d/c to snf today. Contacted [place] to advise clinic of pt's d/c to [facility] snf today and that pt will resume care on Saturday.   Melven Sartorius Renal Navigator [phone]

## 2022-07-29 NOTE — Discharge Summary (Signed)
Physician Discharge Summary  Diamond [name]:[id number] DOB: 1951/05/02 DOA: 07/25/2022  PCP: [name], MD  Admit date: 07/25/2022 Discharge date: 07/29/2022 Recommendations for Outpatient Follow-up:  Follow up with PCP in 1 weeks-call for appointment Please obtain BMP/CBC in one week  Discharge Dispo: SNF Discharge Condition: Stable Code Status:   Code Status: Full Code Diet recommendation:  Diet Order             Diet 2 gram sodium Room service appropriate? Yes with Assist; Fluid consistency: Thin; Fluid restriction: 2000 mL Fluid  Diet effective now                    Brief/Interim Summary: 70-yOF w/ history of atrial flutter, ESRD on  HD TTS, hypertension, recently treated for MRSA bacteremia 2/2 to HD catheter and completed treatment on 07/13/22 w/ history of L5-S1 discitis with MRSA bacteremia and endocarditis in January 2023 who has does have chronic mild back pain but since last few days worsening back pain that brought her to ER.In the ED- had MRI consistent with discitis and phlegmon.Neurosurgery and ID consulted and admitted for further management.Patient underwent MRI of the thoracic and lumbar spine 8/21, seen by neurosurgery they feel it may be postinflammatory changes, with chronic inflammatory condition including C RP, ESR within normal limits, seen by ID blood culture have been negative so far advise antibiotics x4 weeks orally. At this time patient medically stable waiting for placement to skilled nursing facility. 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 History of MRSA bacteremia and back pain with psoas involvement complicated weeks of IV antibiotics 12/2018 and had catheter exchanged: MRI suggests L2-3 area paravertebral edema.Ruled out abscess.  Neurosurgery advised this was postinflammatory findings.With history of recurrent MRSA bacteremia endocarditis and discitis presenting with worsening back pain. MRI of the thoracic and lumbar spine 8/21, evaluated by neurosurgery, ID-and blood culture has been negative CRP ESR normal ID advised 4 weeks of Bactrim until 9/19 and okay to discharge.  She is set up for [facility] ID clinic follow-up on 9/6 at 2:30 PM   Atrial flutter: Rate controlled with metoprolol.  Therapeutic on Eliquis.  Continued. ESRD on hemodialysis: TTS schedule.  Nephrology following can discharge after HD today Medically stable to transfer to SNF level of care. Anemia of chronic renal disease hemoglobin stable 9 to 10 g range.  Monitor Metabolic bone disease monitor calcium Phos Consults: Nephrology, social worker Subjective: Alert and oriented this morning denies any abdominal pain back pain.  Eager to go to skilled nursing facility  Discharge Exam: Vitals:   07/29/22 0757 07/29/22 0854  BP: (!) 183/110 (!) 167/108  Pulse: 72   Resp: 18   Temp: 98.7 F (37.1 C)   SpO2: 100%    General: Pt is alert, awake, not in acute distress Cardiovascular: RRR, S1/S2 +, no rubs, no gallops Respiratory: CTA bilaterally, no wheezing, no rhonchi Abdominal: Soft, NT, ND, bowel sounds + Extremities: no edema, no cyanosis  Discharge Instructions  Discharge Instructions     Discharge instructions   Complete by: As  directed    Please call call MD or return to ER for similar or worsening recurring problem that brought you to hospital or if any fever,nausea/vomiting,abdominal pain, uncontrolled pain, chest pain,  shortness of breath or any other alarming symptoms.  Please follow-up your doctor  as instructed in a week time and call the office for appointment.  Please avoid alcohol, smoking, or any other illicit substance and maintain healthy habits including taking your regular medications as prescribed.  You were cared for by a hospitalist during your hospital stay. If you have any questions about your discharge medications or the care you received while you were in the hospital after you are discharged, you can call the unit and ask to speak with the hospitalist on call if the hospitalist that took care of you is not available.  Once you are discharged, your primary care physician will handle any further medical issues. Please note that NO REFILLS for any discharge medications will be authorized once you are discharged, as it is imperative that you return to your primary care physician (or establish a relationship with a primary care physician if you do not have one) for your aftercare needs so that they can reassess your need for medications and monitor your lab values   Increase activity slowly   Complete by: As directed       Allergies as of 07/29/2022       Reactions   Shellfish Allergy Other (See Comments)   Unknown reaction    Other Rash   Pt states anesthesia makes her brake out in rash        Medication List     STOP taking these medications    cyclobenzaprine 5 MG tablet Commonly known as: FLEXERIL   docusate sodium 100 MG capsule Commonly known as: COLACE   ondansetron 4 MG disintegrating tablet Commonly known as: ZOFRAN-ODT       TAKE these medications    acetaminophen 325 MG tablet Commonly known as: TYLENOL Take 2 tablets (650 mg total) by mouth 4 (four) times daily.   albuterol 108 (90 Base) MCG/ACT inhaler Commonly known as: VENTOLIN HFA Inhale 2 puffs into the lungs every 4 (four) hours as needed for wheezing or shortness of breath.   apixaban 2.5 MG Tabs tablet Commonly known as: ELIQUIS Take 1 tablet (2.5 mg total) by mouth 2 (two)  times daily.   fluticasone-salmeterol 250-50 MCG/ACT Aepb Commonly known as: ADVAIR Inhale 1 puff into the lungs 2 (two) times daily.   lidocaine 4 % Place 1 patch onto the skin daily as needed (pain). What changed: how to take this   metoprolol tartrate 50 MG tablet Commonly known as: LOPRESSOR Take 1 tablet (50 mg total) by mouth 2 (two) times daily.   oxyCODONE 5 MG immediate release tablet Commonly known as: Roxicodone Take 1 tablet (5 mg total) by mouth 2 (two) times daily as needed for up to 5 days for severe pain or breakthrough pain.   pantoprazole 40 MG tablet Commonly known as: PROTONIX Take 1 tablet (40 mg total) by mouth 2 (two) times daily.   sevelamer carbonate 800 MG tablet Commonly known as: RENVELA Take 2 tablets (1,600 mg total) by mouth 3 (three) times daily with meals.   sulfamethoxazole-trimethoprim 800-160 MG tablet Commonly known as: BACTRIM DS Take 1 tablet by mouth at bedtime for 28 days.        Follow-up Information     [facility]  Follow up on 08/11/2022.   Why: at 2.30 pm Contact information: [address] [place] [address]        [name], MD Follow up in 1 week(s).   Specialty: Internal Medicine Contact information: [address] [place] [address] [phone]         Freada Bergeron, MD .   Specialties: Cardiology, Radiology Contact information: [address] [place] [address] [phone]                Allergies  Allergen Reactions   Shellfish Allergy Other (See Comments)    Unknown reaction    Other Rash    Pt states anesthesia makes her brake out in rash    The results of significant diagnostics from this hospitalization (including imaging, microbiology, ancillary and laboratory) are listed below for reference.    Microbiology: Recent Results (from the past 240 hour(s))  Blood  culture (routine single)     Status: None   Collection Time: 07/21/22  4:15 AM   Specimen: Blood  Result Value Ref Range Status   MICRO NUMBER: [id number]  Final   SPECIMEN QUALITY: Adequate  Final   Source BLOOD 1  Final   STATUS: FINAL  Final   Result: No growth after 5 days  Final   COMMENT: Aerobic and anaerobic bottle received.  Final  Blood culture (routine single)     Status: None   Collection Time: 07/21/22  4:23 AM   Specimen: Blood  Result Value Ref Range Status   MICRO NUMBER: [id number]  Final   SPECIMEN QUALITY: Adequate  Final   Source BLOOD 2  Final   STATUS: FINAL  Final   Result: No growth after 5 days  Final   COMMENT: Aerobic and anaerobic bottle received.  Final  Blood culture (routine x 2)     Status: None (Preliminary result)   Collection Time: 07/26/22 11:34 AM   Specimen: BLOOD RIGHT HAND  Result Value Ref Range Status   Specimen Description BLOOD RIGHT HAND  Final   Special Requests   Final    BOTTLES DRAWN AEROBIC AND ANAEROBIC Blood Culture adequate volume   Culture   Final    NO GROWTH 3 DAYS Performed at [facility], [address] [place], [place] [address]    Report Status PENDING  Incomplete  Blood culture (routine x 2)     Status: None (Preliminary result)   Collection Time: 07/27/22  4:12 AM   Specimen: BLOOD RIGHT HAND  Result Value Ref Range Status   Specimen Description BLOOD RIGHT HAND  Final   Special Requests   Final    BOTTLES DRAWN AEROBIC AND ANAEROBIC Blood Culture adequate volume   Culture   Final    NO GROWTH 2 DAYS Performed at [facility], [place] [address], [place], [place] [address]    Report Status PENDING  Incomplete    Procedures/Studies: MR LUMBAR SPINE WO CONTRAST  Result Date: 07/26/2022 CLINICAL DATA:  Low back pain, infection suspected, positive xray/CT acute on chronic back pain, recent fall, hx/o discitis not on abx now with BLE weakness, right greater than left EXAM: MRI LUMBAR SPINE WITHOUT CONTRAST  TECHNIQUE: Multiplanar, multisequence MR imaging of the lumbar spine was performed. No intravenous contrast was administered. COMPARISON:  CT of the lumbar spine 07/25/2022. MRI of the lumbar spine 12/18/2021. FINDINGS: Segmentation: Transitional lumbosacral anatomy with sacralized L5 segment and rudimentary  L5-S1 disc. Alignment:  Similar grade 1 anterolisthesis at L3-L4. Vertebrae: Fracture of the L3 superior endplate better characterized on recent CT. There is associated bone marrow edema, compatible with recent/healing fracture. There is edema within the adjacent L2-L3 disc. Improved L4-L5 edema with surrounding bone marrow edema. Conus medullaris and cauda equina: Conus extends to the L1 level. Conus appears normal. Paraspinal and other soft tissues: Paravertebral edema centered at L2-L3 including anterior paraspinal soft tissue thickening and edema. No discrete, drainable fluid collection; however, the absence of contrast limits assessment. Many cysts within both kidneys. Disc levels: Similar multilevel degenerative change, including moderate to severe canal stenosis L3-L4 and L4-L5 with areas of subarticular recess stenosis. Similar multilevel foraminal narrowing, which appears greatest on the left at L4-L5. IMPRESSION: 1. Transitional lumbosacral anatomy with sacralized L5 segment and rudimentary L5-S1 disc. Correlation with radiographs is recommended prior to any operative intervention. 2. Recent/healing L3 fracture as described on recent CT with edema in the L2-L3 disc and superimposed surrounding paravertebral edema, which is nonspecific but raises concern for superimposed discitis with surrounding phlegmon/infection (particularly given below finding and reported clinical concern). Posttraumatic inflammatory change/edema is a consideration but thought less likely. No discrete, drainable fluid collection identified; however, the absence of contrast limits assessment. If the patient is able, recommend  post-contrast imaging. 3. Improved L4-L5 disc and surrounding bone marrow edema, possibly representing improving discitis/osteomyelitis at this level. Degenerative change is a differential consideration. 4. Similar superimposed multilevel degenerative change, including moderate to severe canal stenosis at L3-L4 and L4-L5. Electronically Signed   By: Margaretha Sheffield M.D.   On: 07/26/2022 10:21   MR THORACIC SPINE WO CONTRAST  Result Date: 07/26/2022 CLINICAL DATA:  Mid-back pain, neuro deficit EXAM: MRI THORACIC SPINE WITHOUT CONTRAST TECHNIQUE: Multiplanar, multisequence MR imaging of the thoracic spine was performed. No intravenous contrast was administered. COMPARISON:  MRI thoracic spine 12/18/2021. FINDINGS: Alignment: Mildly exaggerated thoracic kyphosis. No substantial sagittal subluxation. Vertebrae: Mildly heterogeneous bone marrow without suspicious bone lesion. Scattered benign vertebral venous malformations. Endplate signal changes at multiple levels are most characteristic of degenerative/discogenic endplate signal change. Otherwise, no marrow edema to suggest acute fracture or discitis/osteomyelitis. Cord:  Normal cord signal. Paraspinal and other soft tissues: Partially imaged left renal cysts. Disc levels: Small disc bulges at multiple levels without significant canal stenosis. No significant foraminal stenosis. IMPRESSION: No evidence of acute abnormality or significant canal/foraminal stenosis. Electronically Signed   By: Margaretha Sheffield M.D.   On: 07/26/2022 09:53   CT Lumbar Spine Wo Contrast  Result Date: 07/25/2022 CLINICAL DATA:  Low back pain starting 2 days ago EXAM: CT LUMBAR SPINE WITHOUT CONTRAST TECHNIQUE: Multidetector CT imaging of the lumbar spine was performed without intravenous contrast administration. Multiplanar CT image reconstructions were also generated. RADIATION DOSE REDUCTION: This exam was performed according to the departmental dose-optimization program which  includes automated exposure control, adjustment of the mA and/or kV according to patient size and/or use of iterative reconstruction technique. COMPARISON:  CT abdomen/pelvis 07/27/2022, 06/12/2022, lumbar spine MRI 12/18/2021 FINDINGS: Segmentation: Transitional anatomy is again seen with sacralization of the L5 vertebral body. There is a rudimentary L5-S1 disc. This numbering convention is keeping with the prior lumbar spine MRI from January. Alignment: There is straightening of the normal lumbar lordosis. Grade 1 anterolisthesis of L3 on L4 is unchanged. Vertebrae: There is indentation of the superior L3 endplate by a prominent Schmorl's node, unchanged. There is compression deformity of the L3 vertebral body with up to approximately 25% loss  of vertebral body height anteriorly. A fracture plane extends from the anterior endplate to the posterior endplate with involvement of the superior endplate with mild bony retropulsion resulting moderate severe spinal canal stenosis. The fracture was not present on the study from 01/16/2022 and was likely acute on the study from 06/12/2022 but has progressed since that study. There is marked endplate irregularity along the L4-L5 disc space which is similar to the prior study and likely reflects sequela of discitis/osteomyelitis. The other vertebral body heights are preserved. Paraspinal and other soft tissues: Nonobstructing right renal stones are seen. Simple bilateral renal cysts are seen for which no specific imaging follow-up is required. There is no definite psoas abscess, within the confines of noncontrast technique. Disc levels: T12-L1 and L1-L2: No significant spinal canal or neural foraminal stenosis. L2-L3: Mild disc bulge without significant spinal canal or neural foraminal stenosis L2-L3: There is a disc bulge and mild bilateral facet arthropathy with superimposed bony retropulsion of the posterior L3 endplate resulting in moderate to severe spinal canal  stenosis. No high-grade neural foraminal stenosis. L3-L4: There is grade 1 anterolisthesis with diffuse disc bulge and bilateral facet arthropathy with ligamentum flavum thickening resulting in moderate to severe spinal canal stenosis and mild bilateral neural foraminal stenosis L4-L5: There is endplate spurring and bilateral facet arthropathy with ligamentum flavum thickening resulting in moderate to severe spinal canal stenosis and mild-to-moderate left and mild right neural foraminal stenosis. L5-S1: No significant spinal canal or neural foraminal stenosis. IMPRESSION: 1. Subacute compression fracture of the L3 vertebral body with a fracture plane extending from the anterior to the posterior endplate with involvement of the superior endplate. The fracture was likely acute on the study from 06/12/2022 and has progressed since that study. Mild bony retropulsion at this level results in moderate to severe spinal canal stenosis. 2. Endplate irregularity along the L4-L5 disc space likely reflects sequela of discitis/osteomyelitis, similar to the prior study. 3. Moderate to severe spinal canal stenosis at L3-L4 and L4-L5, similar to the prior MRI. Electronically Signed   By: Valetta Mole M.D.   On: 07/25/2022 19:30   CT ABDOMEN PELVIS W CONTRAST  Result Date: 07/07/2022 CLINICAL DATA:  Epigastric pain for 2 weeks following cholecystectomy, initial encounter. EXAM: CT ABDOMEN AND PELVIS WITH CONTRAST TECHNIQUE: Multidetector CT imaging of the abdomen and pelvis was performed using the standard protocol following bolus administration of intravenous contrast. RADIATION DOSE REDUCTION: This exam was performed according to the departmental dose-optimization program which includes automated exposure control, adjustment of the mA and/or kV according to patient size and/or use of iterative reconstruction technique. CONTRAST:  59m OMNIPAQUE IOHEXOL 300 MG/ML  SOLN COMPARISON:  06/12/2022, CT of the chest from 06/15/2022  FINDINGS: Lower chest: Lung bases demonstrate small left-sided pleural effusion with mild left basilar atelectasis. Stable 9 mm right middle lobe nodule is noted. This was erroneously reported as a right lower lobe nodule on the prior exam. Stable left lower lobe nodule is noted on the first image similar to that seen on prior exam. This has been stable from multiple previous exams dating back to 2011. Hepatobiliary: Gallbladder has been surgically removed. Minimal fluid is noted in the gallbladder bed extending anteriorly along the inferior hepatic margin. This likely represents a mild biloma. No findings to suggest abscess formation are noted at this time. Liver is otherwise within normal limits. Pancreas: Pancreas is within normal limits. Spleen: Scattered calcified granulomas are noted. Adrenals/Urinary Tract: Adrenal glands are within normal limits. Scattered cysts are  noted throughout both kidneys stable in appearance from the prior exam. No further follow-up is recommended. Tiny punctate nonobstructing right renal stone is noted. No obstructive changes are seen. The bladder is partially distended. Stomach/Bowel: No obstructive or inflammatory changes of the colon are seen. The appendix is within normal limits. Small bowel and stomach are unremarkable. Some inflammatory changes are noted adjacent to the second portion of the duodenum near the pancreatic head likely related to the recent surgery. No discrete fluid collection is noted. Vascular/Lymphatic: Aortic atherosclerosis. No enlarged abdominal or pelvic lymph nodes. Reproductive: Uterus and bilateral adnexa are unremarkable. Other: No abdominal wall hernia or abnormality. No abdominopelvic ascites. Musculoskeletal: No acute or significant osseous findings. IMPRESSION: Interval cholecystectomy with fluid attenuation in the gallbladder fossa and extending along the inferior aspect of the liver. No findings to suggest abscess formation are noted. These  changes are likely related to postoperative biloma. This could contribute to the patient's underlying abdominal discomfort. Stable nodules in the right middle and left lower lobes. The left lower lobe is stable over multiple previous exams. The right middle lobe lesion is stable from January of 2023. Future CT at 11-17 months (from today's scan) is considered optional for low-risk patients, but is recommended for high-risk patients. This recommendation follows the consensus statement: Guidelines for Management of Incidental Pulmonary Nodules Detected on CT Images: From the Fleischner Society 2017; Radiology 2017; 284:228-243. Electronically Signed   By: Inez Catalina M.D.   On: 07/07/2022 20:54   DG Chest 2 View  Result Date: 07/07/2022 CLINICAL DATA:  Epigastric pain, shortness of breath, recent cholecystectomy EXAM: CHEST - 2 VIEW COMPARISON:  06/12/2022 FINDINGS: Cardiac size is within normal limits. Thoracic aorta is tortuous. Lung fields are clear of any infiltrates or pulmonary edema. Increase in the AP diameter of chest suggests COPD. Left hemidiaphragm is elevated. There is no pleural effusion or pneumothorax. IMPRESSION: There are no signs of pulmonary edema or focal pulmonary consolidation. Electronically Signed   By: Elmer Picker M.D.   On: 07/07/2022 13:03    Labs: BNP (last 3 results) Recent Labs    01/20/22 0458 01/21/22 0228 06/12/22 1545  BNP 943.3* 760.8* 179.1*   Basic Metabolic Panel: Recent Labs  Lab 07/24/22 2153 07/26/22 0442 07/27/22 0412  NA 140 141 138  K 3.6 3.6 3.6  CL 100 102 101  CO2 '28 27 23  ' GLUCOSE 98 83 94  BUN 7* 14 20  CREATININE 3.98* 5.91* 6.80*  CALCIUM 8.9 9.6 9.1  PHOS  --   --  5.6*   Liver Function Tests: Recent Labs  Lab 07/26/22 0442  AST 13*  ALT 6  ALKPHOS 54  BILITOT 0.8  PROT 6.2*  ALBUMIN 2.7*   Recent Labs  Lab 07/26/22 0442  LIPASE 40   No results for input(s): "AMMONIA" in the last 168 hours. CBC: Recent Labs   Lab 07/24/22 2153 07/26/22 0407 07/27/22 0412  WBC 4.4 4.2 3.9*  NEUTROABS 2.8  --   --   HGB 11.1* 10.8* 9.5*  HCT 36.4 35.1* 28.7*  MCV 105.8* 103.5* 97.3  PLT 172 166 163   Cardiac Enzymes: No results for input(s): "CKTOTAL", "CKMB", "CKMBINDEX", "TROPONINI" in the last 168 hours. BNP: Invalid input(s): "POCBNP" CBG: No results for input(s): "GLUCAP" in the last 168 hours. D-Dimer No results for input(s): "DDIMER" in the last 72 hours. Hgb A1c Recent Labs    07/27/22 0412  HGBA1C <4.2*   Lipid Profile No results for input(s): "CHOL", "  HDL", "LDLCALC", "TRIG", "CHOLHDL", "LDLDIRECT" in the last 72 hours. Thyroid function studies No results for input(s): "TSH", "T4TOTAL", "T3FREE", "THYROIDAB" in the last 72 hours.  Invalid input(s): "FREET3" Anemia work up No results for input(s): "VITAMINB12", "FOLATE", "FERRITIN", "TIBC", "IRON", "RETICCTPCT" in the last 72 hours. Urinalysis    Component Value Date/Time   COLORURINE YELLOW 07/26/2022 0407   APPEARANCEUR CLEAR 07/26/2022 0407   LABSPEC 1.015 07/26/2022 0407   PHURINE 8.5 (H) 07/26/2022 0407   GLUCOSEU 100 (A) 07/26/2022 0407   HGBUR TRACE (A) 07/26/2022 0407   BILIRUBINUR NEGATIVE 07/26/2022 0407   KETONESUR NEGATIVE 07/26/2022 0407   PROTEINUR 100 (A) 07/26/2022 0407   UROBILINOGEN 0.2 09/27/2021 1329   NITRITE NEGATIVE 07/26/2022 0407   LEUKOCYTESUR NEGATIVE 07/26/2022 0407   Sepsis Labs Recent Labs  Lab 07/24/22 2153 07/26/22 0407 07/27/22 0412  WBC 4.4 4.2 3.9*   Microbiology Recent Results (from the past 240 hour(s))  Blood culture (routine single)     Status: None   Collection Time: 07/21/22  4:15 AM   Specimen: Blood  Result Value Ref Range Status   MICRO NUMBER: [id number]  Final   SPECIMEN QUALITY: Adequate  Final   Source BLOOD 1  Final   STATUS: FINAL  Final   Result: No growth after 5 days  Final   COMMENT: Aerobic and anaerobic bottle received.  Final  Blood culture (routine single)      Status: None   Collection Time: 07/21/22  4:23 AM   Specimen: Blood  Result Value Ref Range Status   MICRO NUMBER: [id number]  Final   SPECIMEN QUALITY: Adequate  Final   Source BLOOD 2  Final   STATUS: FINAL  Final   Result: No growth after 5 days  Final   COMMENT: Aerobic and anaerobic bottle received.  Final  Blood culture (routine x 2)     Status: None (Preliminary result)   Collection Time: 07/26/22 11:34 AM   Specimen: BLOOD RIGHT HAND  Result Value Ref Range Status   Specimen Description BLOOD RIGHT HAND  Final   Special Requests   Final    BOTTLES DRAWN AEROBIC AND ANAEROBIC Blood Culture adequate volume   Culture   Final    NO GROWTH 3 DAYS Performed at [facility], [address], [place], [place] [address]    Report Status PENDING  Incomplete  Blood culture (routine x 2)     Status: None (Preliminary result)   Collection Time: 07/27/22  4:12 AM   Specimen: BLOOD RIGHT HAND  Result Value Ref Range Status   Specimen Description BLOOD RIGHT HAND  Final   Special Requests   Final    BOTTLES DRAWN AEROBIC AND ANAEROBIC Blood Culture adequate volume   Culture   Final    NO GROWTH 2 DAYS Performed at [facility], [place] [address] [place], [place] [address]    Report Status PENDING  Incomplete     Time coordinating discharge: 35 minutes  SIGNED: Antonieta Pert, MD  [facility] 07/29/2022, 9:32 AM  If 7PM-7AM, please contact night-coverage [email or web address]

## 2022-07-29 NOTE — Patient Outreach (Addendum)
[facility]) Care Management [facility] care coordinator  Note   07/29/2022 Name:  Diamond [name] MRN:  [id number] DOB:  November 27, 1951  Summary: Noted patient inpatient admission on 07/25/22 at time of a scheduled outreach for 07/29/22  Discharged on 07/29/22 to [place] facility  Recommendations/Changes made from today's visit:  [facility] case discussion for patient pending from [place] pod 3 staff after review with [facility] leadership   Subjective: Diamond [name] is an 71 y.o. year old female who is a primary patient of Osei-Bonsu, Iona Beard, MD. The care management team was consulted for assistance with care management and/or care coordination needs.    Telephonic RN Care Manager completed  [facility] case discussion template  today.  Objective:   Medications Reviewed Today     Reviewed by Marvell Fuller, CPhT (Pharmacy Technician) on 07/26/22 at 1815  Med List Status: Complete   Medication Order Taking? Sig Documenting Provider Last Dose Status Informant  acetaminophen (TYLENOL) 325 MG tablet [id number] Yes Take 2 tablets (650 mg total) by mouth 4 (four) times daily. Elgergawy, Silver Huguenin, MD unk Active Child  albuterol (VENTOLIN HFA) 108 (90 Base) MCG/ACT inhaler [id number] Yes Inhale 2 puffs into the lungs every 4 (four) hours as needed for wheezing or shortness of breath. [provider] unk Active Child  apixaban (ELIQUIS) 2.5 MG TABS tablet [id number] Yes Take 1 tablet (2.5 mg total) by mouth 2 (two) times daily. Jonetta Osgood, MD 07/25/2022 1200 Active Child  cyclobenzaprine (FLEXERIL) 5 MG tablet [id number] No Take 2.5 mg by mouth daily as needed for muscle spasms.  Patient not taking: Reported on 07/26/2022   [provider] Completed Course Active Child  docusate sodium (COLACE) 100 MG capsule [id number] No Take 1 capsule (100 mg total) by mouth 2 (two) times daily.  Patient not taking: Reported on 07/21/2022   Elgergawy, Silver Huguenin, MD Not Taking Active Child  fluticasone-salmeterol (ADVAIR) 250-50 MCG/ACT AEPB [id number] Yes Inhale 1 puff into the lungs 2 (two) times daily. [provider] Past Month Active Child  metoprolol tartrate (LOPRESSOR) 50 MG tablet [id number] Yes Take 1 tablet (50 mg total) by mouth 2 (two) times daily. Jonetta Osgood, MD 07/25/2022 1200 Active Child  ondansetron (ZOFRAN-ODT) 4 MG disintegrating tablet [id number] No Take 1 tablet (4 mg total) by mouth every 8 (eight) hours as needed for nausea or vomiting.  Patient not taking: Reported on 07/26/2022   Hans Eden, NP Completed Course Active Child  oxyCODONE (ROXICODONE) 5 MG immediate release tablet [id number] No Take 1 tablet (5 mg total) by mouth 2 (two) times daily as needed for up to 8 doses for severe pain or breakthrough pain.  Patient not taking: Reported on 07/26/2022   Fransico Meadow, MD Completed Course Active Child  pantoprazole (PROTONIX) 40 MG tablet [id number] Yes Take 1 tablet (40 mg total) by mouth 2 (two) times daily. Thurnell Lose, MD 07/25/2022 am Active Child  sevelamer carbonate (RENVELA) 800 MG tablet [id number] Yes Take 2 tablets (1,600 mg total) by mouth 3 (three) times daily with meals. Jonetta Osgood, MD 07/25/2022 am Active Child  Med List Note Sanjuana Mae 07/26/22 1814): Daughter Diamond [name] handles meds             SDOH:  (Social Determinants of Health) assessments and interventions performed:     Care Plan  Review of patient past medical history, allergies, medications, health status, including review of consultants  reports, laboratory and other test data, was performed as part of comprehensive evaluation for care management services.   There are no care plans that you recently modified to display for this patient.    Plan:   warm transfer of patient to [place] pod RN CMs for completion of further case discussion  Joelene Millin L. Lavina Hamman, RN, BSN, [place]  Coordinator Office number [phone]

## 2022-07-29 NOTE — Progress Notes (Signed)
Received patient in bed to unit.  Alert and oriented.  Informed consent signed and in chart.   Treatment initiated: 1240 Treatment completed: 1458  Patient tolerated well.  Transported back to the room  Alert, without acute distress.  Hand-off given to patient's nurse.   Access used: Av fistula Access issues: none  Total UF removed: 862m Medication(s) given: n/a Post HD VS: 138/111,70,15,100%,98.3 Post HD weight: 50.6kg   [facility]

## 2022-07-29 NOTE — Care Management Important Message (Signed)
Important Message  Patient Details  Name: Diamond [name] MRN: [id number] Date of Birth: 1951-07-18   Medicare Important Message Given:  Yes     Orbie Pyo 07/29/2022, 2:26 PM

## 2022-07-29 NOTE — TOC Transition Note (Signed)
Transition of Care [facility]) - CM/SW Discharge Note   Patient Details  Name: Diamond [name] MRN: [id number] Date of Birth: [birth year]  Transition [facility]) CM/SW Contact:  Geralynn Ochs, LCSW Phone Number: 07/29/2022, 3:33 PM   Clinical Narrative:   CSW received insurance approval, confirmed bed availability at [facility]. Patient can discharge once HD is complete, coordinated with renal navigator. Transport scheduled with [facility] for 5:30. CSW notified daughter, [name], via phone who was agreeable to transition to SNF.   Nurse to call report to [phone].    Final next level of care: Skilled Nursing Facility Barriers to Discharge: Barriers Resolved   Patient Goals and CMS Choice Patient states their goals for this hospitalization and ongoing recovery are:: patient unable to participate in goal setting, not fully oriented CMS Medicare.gov Compare Post Acute Care list provided to:: Patient Represenative (must comment) Choice offered to / list presented to : Adult Children  Discharge Placement              Patient chooses bed at: [facility] Patient to be transferred to facility by: [place] Name of family member notified: [name] Patient and family notified of of transfer: 07/29/22  Discharge Plan and Services     Post Acute Care Choice: [place]                               Social Determinants of Health (SDOH) Interventions     Readmission Risk Interventions    06/30/2022    1:12 PM 06/17/2022    1:02 PM 01/22/2022   12:19 PM  Readmission Risk Prevention Plan  Transportation Screening Complete Complete Complete  Medication Review ([place]) Complete Complete Complete  PCP or Specialist appointment within 3-5 days of discharge Complete Complete Complete  HRI or Home Care Consult Complete Complete Complete  SW Recovery Care/Counseling Consult Complete Complete Complete  Palliative Care Screening Not Applicable  Not Applicable Not [place] Not Applicable Complete Not Applicable

## 2022-07-30 DIAGNOSIS — E440 Moderate protein-calorie malnutrition: Secondary | ICD-10-CM | POA: Diagnosis not present

## 2022-07-30 DIAGNOSIS — E119 Type 2 diabetes mellitus without complications: Secondary | ICD-10-CM | POA: Diagnosis not present

## 2022-07-30 DIAGNOSIS — D631 Anemia in chronic kidney disease: Secondary | ICD-10-CM | POA: Diagnosis not present

## 2022-07-30 DIAGNOSIS — K219 Gastro-esophageal reflux disease without esophagitis: Secondary | ICD-10-CM | POA: Diagnosis not present

## 2022-07-30 DIAGNOSIS — L853 Xerosis cutis: Secondary | ICD-10-CM | POA: Diagnosis not present

## 2022-07-30 DIAGNOSIS — E559 Vitamin D deficiency, unspecified: Secondary | ICD-10-CM | POA: Diagnosis not present

## 2022-07-30 DIAGNOSIS — D649 Anemia, unspecified: Secondary | ICD-10-CM | POA: Diagnosis not present

## 2022-07-30 DIAGNOSIS — M898X9 Other specified disorders of bone, unspecified site: Secondary | ICD-10-CM | POA: Diagnosis not present

## 2022-07-30 DIAGNOSIS — I4892 Unspecified atrial flutter: Secondary | ICD-10-CM | POA: Diagnosis not present

## 2022-07-30 DIAGNOSIS — M4640 Discitis, unspecified, site unspecified: Secondary | ICD-10-CM | POA: Diagnosis not present

## 2022-07-30 DIAGNOSIS — N186 End stage renal disease: Secondary | ICD-10-CM | POA: Diagnosis not present

## 2022-07-30 LAB — QUANTIFERON-TB GOLD PLUS (RQFGPL)
QuantiFERON Mitogen Value: >10 IU/mL
QuantiFERON Nil Value: 0.08 IU/mL
QuantiFERON TB1 Ag Value: 0.06 IU/mL
QuantiFERON TB2 Ag Value: 0.06 IU/mL

## 2022-07-30 LAB — QUANTIFERON-TB GOLD PLUS: QuantiFERON-TB Gold Plus: NEGATIVE

## 2022-07-31 DIAGNOSIS — Z992 Dependence on renal dialysis: Secondary | ICD-10-CM | POA: Diagnosis not present

## 2022-07-31 DIAGNOSIS — N2581 Secondary hyperparathyroidism of renal origin: Secondary | ICD-10-CM | POA: Diagnosis not present

## 2022-07-31 DIAGNOSIS — N186 End stage renal disease: Secondary | ICD-10-CM | POA: Diagnosis not present

## 2022-07-31 LAB — CULTURE, BLOOD (ROUTINE X 2)
Culture: NO GROWTH
Special Requests: ADEQUATE

## 2022-08-01 LAB — CULTURE, BLOOD (ROUTINE X 2)
Culture: NO GROWTH
Special Requests: ADEQUATE

## 2022-08-03 DIAGNOSIS — N186 End stage renal disease: Secondary | ICD-10-CM | POA: Diagnosis not present

## 2022-08-03 DIAGNOSIS — N2581 Secondary hyperparathyroidism of renal origin: Secondary | ICD-10-CM | POA: Diagnosis not present

## 2022-08-03 DIAGNOSIS — Z992 Dependence on renal dialysis: Secondary | ICD-10-CM | POA: Diagnosis not present

## 2022-08-05 DIAGNOSIS — N186 End stage renal disease: Secondary | ICD-10-CM | POA: Diagnosis not present

## 2022-08-05 DIAGNOSIS — Z992 Dependence on renal dialysis: Secondary | ICD-10-CM | POA: Diagnosis not present

## 2022-08-05 DIAGNOSIS — I120 Hypertensive chronic kidney disease with stage 5 chronic kidney disease or end stage renal disease: Secondary | ICD-10-CM | POA: Diagnosis not present

## 2022-08-06 DIAGNOSIS — I4892 Unspecified atrial flutter: Secondary | ICD-10-CM | POA: Diagnosis not present

## 2022-08-06 DIAGNOSIS — M898X9 Other specified disorders of bone, unspecified site: Secondary | ICD-10-CM | POA: Diagnosis not present

## 2022-08-06 DIAGNOSIS — D649 Anemia, unspecified: Secondary | ICD-10-CM | POA: Diagnosis not present

## 2022-08-06 DIAGNOSIS — K219 Gastro-esophageal reflux disease without esophagitis: Secondary | ICD-10-CM | POA: Diagnosis not present

## 2022-08-06 DIAGNOSIS — E119 Type 2 diabetes mellitus without complications: Secondary | ICD-10-CM | POA: Diagnosis not present

## 2022-08-06 DIAGNOSIS — M4640 Discitis, unspecified, site unspecified: Secondary | ICD-10-CM | POA: Diagnosis not present

## 2022-08-06 DIAGNOSIS — N186 End stage renal disease: Secondary | ICD-10-CM | POA: Diagnosis not present

## 2022-08-07 DIAGNOSIS — N2581 Secondary hyperparathyroidism of renal origin: Secondary | ICD-10-CM | POA: Diagnosis not present

## 2022-08-07 DIAGNOSIS — Z992 Dependence on renal dialysis: Secondary | ICD-10-CM | POA: Diagnosis not present

## 2022-08-07 DIAGNOSIS — N186 End stage renal disease: Secondary | ICD-10-CM | POA: Diagnosis not present

## 2022-08-10 DIAGNOSIS — Z992 Dependence on renal dialysis: Secondary | ICD-10-CM | POA: Diagnosis not present

## 2022-08-10 DIAGNOSIS — N390 Urinary tract infection, site not specified: Secondary | ICD-10-CM | POA: Diagnosis not present

## 2022-08-10 DIAGNOSIS — N2581 Secondary hyperparathyroidism of renal origin: Secondary | ICD-10-CM | POA: Diagnosis not present

## 2022-08-10 DIAGNOSIS — N186 End stage renal disease: Secondary | ICD-10-CM | POA: Diagnosis not present

## 2022-08-11 ENCOUNTER — Other Ambulatory Visit: Payer: Self-pay

## 2022-08-11 ENCOUNTER — Encounter: Payer: Self-pay | Admitting: Internal Medicine

## 2022-08-11 ENCOUNTER — Ambulatory Visit (INDEPENDENT_AMBULATORY_CARE_PROVIDER_SITE_OTHER): Payer: Medicare HMO | Admitting: Internal Medicine

## 2022-08-11 VITALS — BP 165/102 | HR 68 | Temp 98.3°F

## 2022-08-11 DIAGNOSIS — M4620 Osteomyelitis of vertebra, site unspecified: Secondary | ICD-10-CM | POA: Diagnosis not present

## 2022-08-11 DIAGNOSIS — R7881 Bacteremia: Secondary | ICD-10-CM

## 2022-08-11 DIAGNOSIS — I10 Essential (primary) hypertension: Secondary | ICD-10-CM | POA: Diagnosis not present

## 2022-08-11 DIAGNOSIS — B9562 Methicillin resistant Staphylococcus aureus infection as the cause of diseases classified elsewhere: Secondary | ICD-10-CM | POA: Diagnosis not present

## 2022-08-11 NOTE — Progress Notes (Signed)
[place] for Infectious Disease  Patient Active Problem List   Diagnosis Date Noted   Discitis of lumbosacral region 07/26/2022   Asymptomatic hypertensive urgency 07/21/2022   Bacteremia 06/30/2022   Malnutrition of moderate degree 06/22/2022   Physical deconditioning 06/18/2022   Pressure injury of skin 06/16/2022   MRSA bacteremia 06/13/2022   ESRD on dialysis [facility])    Paroxysmal atrial flutter (HCC)    Acute metabolic encephalopathy    Acute on chronic anemia with positive fecal occult  02/05/2022   Multiple polyps of sigmoid colon    Heme positive stool    Duodenitis    Candida esophagitis (HCC)    Gallbladder mass    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  01/16/2022   Septic discitis of lumbar region 01/16/2022   Hepatitis C without hepatic coma 01/15/2022   Sepsis due to methicillin resistant Staphylococcus aureus ([place]) 12/31/2021   Atrial flutter ([employer]) 12/25/2021   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 seen on TEE on 1/17 12/24/2021   Lung nodule seen on imaging study 12/23/2021   Vertebral osteomyelitis ([place])    Sepsis due to methicillin susceptible Staphylococcus aureus ([place]) 12/17/2021   Lower back pain 12/17/2021   ESRD on hemodialysis ([place]) 12/17/2021   Encounter for screening for other viral diseases 11/03/2021   Allergy, unspecified, initial encounter 09/14/2021   Coagulation defect, unspecified ([place]) 27/05/2375   Complication of vascular dialysis catheter 09/14/2021   Gout due to renal impairment, right ankle and foot 09/14/2021   Iron deficiency anemia, unspecified 09/14/2021   Pain, unspecified 09/14/2021   Pruritus, unspecified 09/14/2021   Secondary hyperparathyroidism of renal origin ([place]) 09/14/2021   Shortness of breath 09/14/2021   Unspecified protein-calorie malnutrition ([place]) 09/14/2021   Vitamin D deficiency 01/02/2021   Hyperparathyroidism ([place]) 12/31/2020   CKD (chronic kidney  disease), stage III ([place]) 11/10/2019   Cardiomegaly 11/05/2019   Hyperkalemia 11/05/2019   Hypokalemia 10/18/2019   Pneumonia due to COVID-19 virus 10/17/2019   Hypertension    Asthma    Gout    Hyponatremia    Multifactorial anemia-acute blood loss from bleeding HD catheter site-superimposed on anemia related to ESRD.       Subjective:    Patient ID: Diamond [name], female    DOB: [birth year], 71 y.o.   MRN: [id number]  Chief Complaint  Patient presents with   Follow-up    HPI:  Diamond [name] is a 71 y.o. female esrd, recurrent mrsa bacteremia, recent finished 4 weeks iv cefazolin 07/13/22 for mrsa bacteremia then readmitted 2 weeks later 8/22 for acute on chronic back pain with mri concerning for l2-3 paravertebral edema and still elevated crp, here for hospital f/u  She was discharged on planned 4 more weeks of bactrim planned to complete 8 week treatment  She reports less pain today since admission but still bad 8/10  No fever, chill Has good appetite No rash No n/v No diarrhea  Of note, patient had her gallbladder removed 7/21 and since has been on wheel chair  Allergies  Allergen Reactions   Shellfish Allergy Other (See Comments)    Unknown reaction    Other Rash    Pt states anesthesia makes her brake out in rash      Outpatient Medications Prior to Visit  Medication Sig Dispense Refill   acetaminophen (TYLENOL) 325 MG tablet Take 2 tablets (650 mg total) by mouth 4 (four) times daily.     albuterol (VENTOLIN  HFA) 108 (90 Base) MCG/ACT inhaler Inhale 2 puffs into the lungs every 4 (four) hours as needed for wheezing or shortness of breath.     apixaban (ELIQUIS) 2.5 MG TABS tablet Take 1 tablet (2.5 mg total) by mouth 2 (two) times daily. 60 tablet 11   fluticasone-salmeterol (ADVAIR) 250-50 MCG/ACT AEPB Inhale 1 puff into the lungs 2 (two) times daily.     lidocaine 4 % Place 1 patch onto the skin daily as needed (pain).     metoprolol tartrate  (LOPRESSOR) 50 MG tablet Take 1 tablet (50 mg total) by mouth 2 (two) times daily.     pantoprazole (PROTONIX) 40 MG tablet Take 1 tablet (40 mg total) by mouth 2 (two) times daily. 60 tablet 0   sulfamethoxazole-trimethoprim (BACTRIM DS) 800-160 MG tablet Take 1 tablet by mouth at bedtime for 28 days. 28 tablet 0   sevelamer carbonate (RENVELA) 800 MG tablet Take 2 tablets (1,600 mg total) by mouth 3 (three) times daily with meals.     verapamil (CALAN) 120 MG tablet Take by mouth.     No facility-administered medications prior to visit.     Social History   Socioeconomic History   Marital status: Widowed    Spouse name: Not on file   Number of children: Not on file   Years of education: Not on file   Highest education level: Not on file  Occupational History   Not on file  Tobacco Use   Smoking status: Never    Passive exposure: Never   Smokeless tobacco: Never  Vaping Use   Vaping Use: Never used  Substance and Sexual Activity   Alcohol use: No   Drug use: No   Sexual activity: Not Currently    Birth control/protection: Post-menopausal  Other Topics Concern   Not on file  Social History Narrative   Not on file   Social Determinants of Health   Financial Resource Strain: Not on file  Food Insecurity: No Food Insecurity (07/20/2022)   Hunger Vital Sign    Worried About Running Out of Food in the Last Year: Never true    Ran Out of Food in the Last Year: Never true  Transportation Needs: No Transportation Needs (07/20/2022)   PRAPARE - Hydrologist (Medical): No    Lack of Transportation (Non-Medical): No  Physical Activity: Not on file  Stress: Not on file  Social Connections: Moderately Integrated (07/20/2022)   Social Connection and Isolation Panel [NHANES]    Frequency of Communication with Friends and Family: Three times a week    Frequency of Social Gatherings with Friends and Family: Three times a week    Attends Religious Services:  1 to 4 times per year    Active Member of Clubs or Organizations: Yes    Attends Archivist Meetings: 1 to 4 times per year    Marital Status: Widowed  Intimate Partner Violence: Not At Risk (07/20/2022)   Humiliation, Afraid, Rape, and Kick questionnaire    Fear of Current or Ex-Partner: No    Emotionally Abused: No    Physically Abused: No    Sexually Abused: No      Review of Systems    All other ros negative  Objective:    BP (!) 165/102   Pulse 68   Temp 98.3 F (36.8 C) (Oral)   SpO2 99%  Nursing note and vital signs reviewed.  Physical Exam     General/constitutional: no  distress, pleasant HEENT: Normocephalic, PER, Conj Clear, EOMI, Oropharynx clear Neck supple CV: rrr no mrg Lungs: clear to auscultation, normal respiratory effort Abd: Soft, Nontender Ext: no edema Skin: No Rash Neuro: nonfocal MSK: no peripheral joint swelling/tenderness/warmth; back spines nontender     Labs:  Micro:  Serology:  Imaging:  Assessment & Plan:   Problem List Items Addressed This Visit       Musculoskeletal and Integument   Vertebral osteomyelitis (HCC) - Primary   Relevant Orders   C-reactive protein     Other   MRSA bacteremia   Relevant Orders   C-reactive protein    Patient's pain slight improvement Suspect large portion is neuropathic pain; no tenderness on exam Advise her to avoid opioid    Continue bactrim ds 1 tab qhs until 9/19 Crp today  F/u 4 weeks and will repeat crp again with reevaluation. Aiming for stabilization of crp    Follow-up: Return in about 3 weeks (around 09/01/2022).      Jabier Mutton, [place] for Infectious Disease [facility] 08/11/2022, 2:26 PM

## 2022-08-11 NOTE — Patient Instructions (Signed)
Finish bactrim antibiotics on 9/19   Labs today. If your crp continues to decrease and stabilize we'll plan on not extending antibiotics    See me again in around 3-4 weeks

## 2022-08-12 DIAGNOSIS — Z992 Dependence on renal dialysis: Secondary | ICD-10-CM | POA: Diagnosis not present

## 2022-08-12 DIAGNOSIS — N186 End stage renal disease: Secondary | ICD-10-CM | POA: Diagnosis not present

## 2022-08-12 DIAGNOSIS — N2581 Secondary hyperparathyroidism of renal origin: Secondary | ICD-10-CM | POA: Diagnosis not present

## 2022-08-12 LAB — C-REACTIVE PROTEIN: CRP: 12.7 mg/L — ABNORMAL HIGH (ref <8.0)

## 2022-08-14 DIAGNOSIS — N186 End stage renal disease: Secondary | ICD-10-CM | POA: Diagnosis not present

## 2022-08-14 DIAGNOSIS — Z992 Dependence on renal dialysis: Secondary | ICD-10-CM | POA: Diagnosis not present

## 2022-08-14 DIAGNOSIS — N2581 Secondary hyperparathyroidism of renal origin: Secondary | ICD-10-CM | POA: Diagnosis not present

## 2022-08-16 ENCOUNTER — Ambulatory Visit (HOSPITAL_COMMUNITY): Attending: Cardiology

## 2022-08-16 DIAGNOSIS — I5189 Other ill-defined heart diseases: Secondary | ICD-10-CM

## 2022-08-16 LAB — ECHOCARDIOGRAM COMPLETE
Area-P 1/2: 3.02 cm2
S' Lateral: 3.1 cm

## 2022-08-17 ENCOUNTER — Encounter (HOSPITAL_COMMUNITY): Payer: Self-pay

## 2022-08-17 ENCOUNTER — Telehealth: Payer: Self-pay | Admitting: *Deleted

## 2022-08-17 DIAGNOSIS — N2581 Secondary hyperparathyroidism of renal origin: Secondary | ICD-10-CM | POA: Diagnosis not present

## 2022-08-17 DIAGNOSIS — I5189 Other ill-defined heart diseases: Secondary | ICD-10-CM

## 2022-08-17 DIAGNOSIS — I77819 Aortic ectasia, unspecified site: Secondary | ICD-10-CM

## 2022-08-17 DIAGNOSIS — N186 End stage renal disease: Secondary | ICD-10-CM | POA: Diagnosis not present

## 2022-08-17 DIAGNOSIS — Z992 Dependence on renal dialysis: Secondary | ICD-10-CM | POA: Diagnosis not present

## 2022-08-17 NOTE — Telephone Encounter (Signed)
-----   Message from Freada Bergeron, MD sent at 08/16/2022  8:20 PM EDT ----- Her echo looks stable. Her pumping function is normal. The mass that was in the RV is stable and unchanged from her prior echoes. Her aorta is stable in size and remains mildly dilated. We will continue monitoring with yearly echoes.

## 2022-08-17 NOTE — Telephone Encounter (Signed)
The patient has been notified of the result and verbalized understanding.  All questions (if any) were answered.  Pt aware that I will place the order for repeat echo in one year in the system, and send a message to our Echo Scheduler to call her back and arrange this appt for that time.   Pt verbalized understanding and agrees with this plan.

## 2022-08-19 DIAGNOSIS — N186 End stage renal disease: Secondary | ICD-10-CM | POA: Diagnosis not present

## 2022-08-19 DIAGNOSIS — Z992 Dependence on renal dialysis: Secondary | ICD-10-CM | POA: Diagnosis not present

## 2022-08-19 DIAGNOSIS — N2581 Secondary hyperparathyroidism of renal origin: Secondary | ICD-10-CM | POA: Diagnosis not present

## 2022-08-20 DIAGNOSIS — K219 Gastro-esophageal reflux disease without esophagitis: Secondary | ICD-10-CM | POA: Diagnosis not present

## 2022-08-20 DIAGNOSIS — D649 Anemia, unspecified: Secondary | ICD-10-CM | POA: Diagnosis not present

## 2022-08-20 DIAGNOSIS — R7881 Bacteremia: Secondary | ICD-10-CM | POA: Diagnosis not present

## 2022-08-20 DIAGNOSIS — N186 End stage renal disease: Secondary | ICD-10-CM | POA: Diagnosis not present

## 2022-08-20 DIAGNOSIS — I10 Essential (primary) hypertension: Secondary | ICD-10-CM | POA: Diagnosis not present

## 2022-08-20 DIAGNOSIS — M4640 Discitis, unspecified, site unspecified: Secondary | ICD-10-CM | POA: Diagnosis not present

## 2022-08-20 DIAGNOSIS — Z992 Dependence on renal dialysis: Secondary | ICD-10-CM | POA: Diagnosis not present

## 2022-08-20 DIAGNOSIS — I4892 Unspecified atrial flutter: Secondary | ICD-10-CM | POA: Diagnosis not present

## 2022-08-21 DIAGNOSIS — N186 End stage renal disease: Secondary | ICD-10-CM | POA: Diagnosis not present

## 2022-08-21 DIAGNOSIS — Z992 Dependence on renal dialysis: Secondary | ICD-10-CM | POA: Diagnosis not present

## 2022-08-21 DIAGNOSIS — N2581 Secondary hyperparathyroidism of renal origin: Secondary | ICD-10-CM | POA: Diagnosis not present

## 2022-08-24 DIAGNOSIS — N186 End stage renal disease: Secondary | ICD-10-CM | POA: Diagnosis not present

## 2022-08-24 DIAGNOSIS — N2581 Secondary hyperparathyroidism of renal origin: Secondary | ICD-10-CM | POA: Diagnosis not present

## 2022-08-24 DIAGNOSIS — Z992 Dependence on renal dialysis: Secondary | ICD-10-CM | POA: Diagnosis not present

## 2022-08-26 ENCOUNTER — Encounter: Payer: Self-pay | Admitting: *Deleted

## 2022-08-28 DIAGNOSIS — Z992 Dependence on renal dialysis: Secondary | ICD-10-CM | POA: Diagnosis not present

## 2022-08-28 DIAGNOSIS — N2581 Secondary hyperparathyroidism of renal origin: Secondary | ICD-10-CM | POA: Diagnosis not present

## 2022-08-28 DIAGNOSIS — N186 End stage renal disease: Secondary | ICD-10-CM | POA: Diagnosis not present

## 2022-08-31 DIAGNOSIS — Z992 Dependence on renal dialysis: Secondary | ICD-10-CM | POA: Diagnosis not present

## 2022-08-31 DIAGNOSIS — N186 End stage renal disease: Secondary | ICD-10-CM | POA: Diagnosis not present

## 2022-08-31 DIAGNOSIS — N2581 Secondary hyperparathyroidism of renal origin: Secondary | ICD-10-CM | POA: Diagnosis not present

## 2022-09-03 DIAGNOSIS — I10 Essential (primary) hypertension: Secondary | ICD-10-CM | POA: Diagnosis not present

## 2022-09-04 DIAGNOSIS — I120 Hypertensive chronic kidney disease with stage 5 chronic kidney disease or end stage renal disease: Secondary | ICD-10-CM | POA: Diagnosis not present

## 2022-09-04 DIAGNOSIS — Z992 Dependence on renal dialysis: Secondary | ICD-10-CM | POA: Diagnosis not present

## 2022-09-04 DIAGNOSIS — N186 End stage renal disease: Secondary | ICD-10-CM | POA: Diagnosis not present

## 2022-09-04 DIAGNOSIS — N2581 Secondary hyperparathyroidism of renal origin: Secondary | ICD-10-CM | POA: Diagnosis not present

## 2022-09-07 DIAGNOSIS — Z992 Dependence on renal dialysis: Secondary | ICD-10-CM | POA: Diagnosis not present

## 2022-09-07 DIAGNOSIS — N2581 Secondary hyperparathyroidism of renal origin: Secondary | ICD-10-CM | POA: Diagnosis not present

## 2022-09-07 DIAGNOSIS — N186 End stage renal disease: Secondary | ICD-10-CM | POA: Diagnosis not present

## 2022-09-09 DIAGNOSIS — N2581 Secondary hyperparathyroidism of renal origin: Secondary | ICD-10-CM | POA: Diagnosis not present

## 2022-09-09 DIAGNOSIS — N186 End stage renal disease: Secondary | ICD-10-CM | POA: Diagnosis not present

## 2022-09-09 DIAGNOSIS — Z992 Dependence on renal dialysis: Secondary | ICD-10-CM | POA: Diagnosis not present

## 2022-09-11 DIAGNOSIS — N2581 Secondary hyperparathyroidism of renal origin: Secondary | ICD-10-CM | POA: Diagnosis not present

## 2022-09-11 DIAGNOSIS — N186 End stage renal disease: Secondary | ICD-10-CM | POA: Diagnosis not present

## 2022-09-11 DIAGNOSIS — Z992 Dependence on renal dialysis: Secondary | ICD-10-CM | POA: Diagnosis not present

## 2022-09-14 DIAGNOSIS — N186 End stage renal disease: Secondary | ICD-10-CM | POA: Diagnosis not present

## 2022-09-14 DIAGNOSIS — N2581 Secondary hyperparathyroidism of renal origin: Secondary | ICD-10-CM | POA: Diagnosis not present

## 2022-09-14 DIAGNOSIS — Z992 Dependence on renal dialysis: Secondary | ICD-10-CM | POA: Diagnosis not present

## 2022-09-15 ENCOUNTER — Encounter: Payer: Self-pay | Admitting: Internal Medicine

## 2022-09-15 ENCOUNTER — Ambulatory Visit (INDEPENDENT_AMBULATORY_CARE_PROVIDER_SITE_OTHER): Payer: Medicare HMO | Admitting: Internal Medicine

## 2022-09-15 ENCOUNTER — Other Ambulatory Visit: Payer: Self-pay

## 2022-09-15 VITALS — BP 162/95 | HR 76 | Temp 98.2°F | Ht 69.0 in | Wt 114.0 lb

## 2022-09-15 DIAGNOSIS — B9562 Methicillin resistant Staphylococcus aureus infection as the cause of diseases classified elsewhere: Secondary | ICD-10-CM | POA: Diagnosis not present

## 2022-09-15 DIAGNOSIS — R7881 Bacteremia: Secondary | ICD-10-CM

## 2022-09-15 DIAGNOSIS — M4620 Osteomyelitis of vertebra, site unspecified: Secondary | ICD-10-CM

## 2022-09-15 MED ORDER — GABAPENTIN 100 MG PO CAPS: 100.0000 mg | ORAL_CAPSULE | Freq: Every day | ORAL | 0 refills | Status: DC

## 2022-09-15 MED ORDER — GABAPENTIN 100 MG PO CAPS: 100.0000 mg | ORAL_CAPSULE | Freq: Three times a day (TID) | ORAL | 0 refills | Status: DC

## 2022-09-15 NOTE — Progress Notes (Signed)
[facility]  Patient Active Problem List   Diagnosis Date Noted   Discitis of lumbosacral region 07/26/2022   Asymptomatic hypertensive urgency 07/21/2022   Bacteremia 06/30/2022   Malnutrition of moderate degree 06/22/2022   Physical deconditioning 06/18/2022   Pressure injury of skin 06/16/2022   MRSA bacteremia 06/13/2022   ESRD on dialysis [facility])    Paroxysmal atrial flutter (HCC)    Acute metabolic encephalopathy    Acute on chronic anemia with positive fecal occult  02/05/2022   Multiple polyps of sigmoid colon    Heme positive stool    Duodenitis    Candida esophagitis (HCC)    Gallbladder mass    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  01/16/2022   Septic discitis of lumbar region 01/16/2022   Hepatitis C without hepatic coma 01/15/2022   Sepsis due to methicillin resistant Staphylococcus aureus ([place]) 12/31/2021   Atrial flutter ([place]) 12/25/2021   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 seen on TEE on 1/17 12/24/2021   Lung nodule seen on imaging study 12/23/2021   Vertebral osteomyelitis ([place])    Sepsis due to methicillin susceptible Staphylococcus aureus ([place]) 12/17/2021   Lower back pain 12/17/2021   ESRD on hemodialysis ([place]) 12/17/2021   Encounter for screening for other viral diseases 11/03/2021   Allergy, unspecified, initial encounter 09/14/2021   Coagulation defect, unspecified ([place]) 27/78/2423   Complication of vascular dialysis catheter 09/14/2021   Gout due to renal impairment, right ankle and foot 09/14/2021   Iron deficiency anemia, unspecified 09/14/2021   Pain, unspecified 09/14/2021   Pruritus, unspecified 09/14/2021   Secondary hyperparathyroidism of renal origin ([place]) 09/14/2021   Shortness of breath 09/14/2021   Unspecified protein-calorie malnutrition ([place]) 09/14/2021   Vitamin D deficiency 01/02/2021   Hyperparathyroidism ([place]) 12/31/2020   CKD (chronic kidney  disease), stage III ([place]) 11/10/2019   Cardiomegaly 11/05/2019   Hyperkalemia 11/05/2019   Hypokalemia 10/18/2019   Pneumonia due to COVID-19 virus 10/17/2019   Hypertension    Asthma    Gout    Hyponatremia    Multifactorial anemia-acute blood loss from bleeding HD catheter site-superimposed on anemia related to ESRD.       Subjective:    Patient ID: Diamond [name], female    DOB: 1951/09/19, 71 y.o.   MRN: [id number]  Chief Complaint  Patient presents with   Follow-up    HPI:  Diamond [name] is a 71 y.o. female esrd, recurrent mrsa bacteremia, recent finished 4 weeks iv cefazolin 07/13/22 for mrsa bacteremia then readmitted 2 weeks later 8/22 for acute on chronic back pain with mri concerning for l2-3 paravertebral edema and still elevated crp, here for hospital f/u  She was discharged on planned 4 more weeks of bactrim planned to complete 8 week treatment  She reports less pain today since admission but still bad 8/10  No fever, chill Has good appetite No rash No n/v No diarrhea  Of note, patient had her gallbladder removed 7/21 and since has been on wheel chair   09/15/22 id clinic f/u She finished bactrim a couple weeks ago she think but not sure Pain same as last visit No worsening appetite No f/c No numbness/tingling No urinary incontinence    Allergies  Allergen Reactions   Shellfish Allergy Other (See Comments)    Unknown reaction    Other Rash    Pt states anesthesia makes her brake out in rash  Outpatient Medications Prior to Visit  Medication Sig Dispense Refill   acetaminophen (TYLENOL) 325 MG tablet Take 2 tablets (650 mg total) by mouth 4 (four) times daily.     albuterol (VENTOLIN HFA) 108 (90 Base) MCG/ACT inhaler Inhale 2 puffs into the lungs every 4 (four) hours as needed for wheezing or shortness of breath.     apixaban (ELIQUIS) 2.5 MG TABS tablet Take 1 tablet (2.5 mg total) by mouth 2 (two) times daily. 60 tablet 11    fluticasone-salmeterol (ADVAIR) 250-50 MCG/ACT AEPB Inhale 1 puff into the lungs 2 (two) times daily.     lidocaine 4 % Place 1 patch onto the skin daily as needed (pain).     metoprolol tartrate (LOPRESSOR) 50 MG tablet Take 1 tablet (50 mg total) by mouth 2 (two) times daily.     pantoprazole (PROTONIX) 40 MG tablet Take 1 tablet (40 mg total) by mouth 2 (two) times daily. 60 tablet 0   sevelamer carbonate (RENVELA) 800 MG tablet Take 2 tablets (1,600 mg total) by mouth 3 (three) times daily with meals.     verapamil (CALAN) 120 MG tablet Take by mouth.     No facility-administered medications prior to visit.     Social History   Socioeconomic History   Marital status: Widowed    Spouse name: Not on file   Number of children: Not on file   Years of education: Not on file   Highest education level: Not on file  Occupational History   Not on file  Tobacco Use   Smoking status: Never    Passive exposure: Never   Smokeless tobacco: Never  Vaping Use   Vaping Use: Never used  Substance and Sexual Activity   Alcohol use: No   Drug use: No   Sexual activity: Not Currently    Birth control/protection: Post-menopausal  Other Topics Concern   Not on file  Social History Narrative   Not on file   Social Determinants of Health   Financial Resource Strain: Not on file  Food Insecurity: No Food Insecurity (07/20/2022)   Hunger Vital Sign    Worried About Running Out of Food in the Last Year: Never true    Ran Out of Food in the Last Year: Never true  Transportation Needs: No Transportation Needs (07/20/2022)   PRAPARE - Hydrologist (Medical): No    Lack of Transportation (Non-Medical): No  Physical Activity: Not on file  Stress: Not on file  Social Connections: Moderately Integrated (07/20/2022)   Social Connection and Isolation Panel [NHANES]    Frequency of Communication with Friends and Family: Three times a week    Frequency of Social Gatherings  with Friends and Family: Three times a week    Attends Religious Services: 1 to 4 times per year    Active Member of Clubs or Organizations: Yes    Attends Archivist Meetings: 1 to 4 times per year    Marital Status: Widowed  Intimate Partner Violence: Not At Risk (07/20/2022)   Humiliation, Afraid, Rape, and Kick questionnaire    Fear of Current or Ex-Partner: No    Emotionally Abused: No    Physically Abused: No    Sexually Abused: No      Review of Systems    All other ros negative  Objective:    BP (!) 162/95 Comment: R Forearm BP; pt states this range is typical for her  Pulse 76   Temp  98.2 F (36.8 C) (Temporal)   Ht '5\' 9"'$  (1.753 m)   Wt 114 lb (51.7 kg) Comment: pt in w/c; states weight from last dialysis  SpO2 97%   BMI 16.83 kg/m  Nursing note and vital signs reviewed.  Physical Exam     General/constitutional: no distress, pleasant HEENT: Normocephalic, PER, Conj Clear, EOMI, Oropharynx clear Neck supple CV: rrr no mrg Lungs: clear to auscultation, normal respiratory effort Abd: Soft, Nontender Ext: no edema Skin: No Rash Neuro: nonfocal MSK: no peripheral joint swelling/tenderness/warmth; back spines nontender     Labs: Lab Results  Component Value Date   WBC 3.9 (L) 07/27/2022   HGB 9.5 (L) 07/27/2022   HCT 28.7 (L) 07/27/2022   MCV 97.3 07/27/2022   PLT 163 35/57/3220   Last metabolic panel Lab Results  Component Value Date   GLUCOSE 94 07/27/2022   NA 138 07/27/2022   K 3.6 07/27/2022   CL 101 07/27/2022   CO2 23 07/27/2022   BUN 20 07/27/2022   CREATININE 6.80 (H) 07/27/2022   GFRNONAA 6 (L) 07/27/2022   CALCIUM 9.1 07/27/2022   PHOS 5.6 (H) 07/27/2022   PROT 6.2 (L) 07/26/2022   ALBUMIN 2.7 (L) 07/26/2022   LABGLOB 3.6 10/17/2019   AGRATIO 0.6 (L) 10/17/2019   BILITOT 0.8 07/26/2022   ALKPHOS 54 07/26/2022   AST 13 (L) 07/26/2022   ALT 6 07/26/2022   ANIONGAP 14 07/27/2022       Component Value Date/Time    CRP 12.7 (H) 08/11/2022 0252   CRP 5.0 (H) 07/26/2022 1255   CRP 6.2 (H) 01/21/2022 0228    Micro:  Serology:  Imaging:  Assessment & Plan:   Problem List Items Addressed This Visit       Musculoskeletal and Integument   Vertebral osteomyelitis (HCC)   Relevant Orders   CBC   C-reactive protein   COMPLETE METABOLIC PANEL WITH GFR     Other   MRSA bacteremia - Primary   Relevant Orders   CBC   C-reactive protein   COMPLETE METABOLIC PANEL WITH GFR   Patient's pain slight improvement Suspect large portion is neuropathic pain; no tenderness on exam Advise her to avoid opioid    Continue bactrim ds 1 tab qhs until 9/19 Crp today  F/u 4 weeks and will repeat crp again with reevaluation. Aiming for stabilization of crp  ----------- 09/15/22 Will see what crp is doing Doesn't appear her infection is active at this time Exam showed no tenderness and the pain suspected to be neuropathic  Will repeat lab again and evaluation in 4 weeks Advise complex pain clinic management  If crp continues to go up will consider mri  Will start gabapentin  Advise she talks to pcp about  pain clinic referal  Follow-up: Return in about 5 weeks (around 10/20/2022).      Jabier Mutton, [place] [facility] 09/15/2022, 4:39 PM

## 2022-09-15 NOTE — Patient Instructions (Addendum)
We'll repeat crp (inflammation lab) today and also in around another 4-6 weeks  If these improves since 08/2022 then we will be more assured that the infection was well treated. If it goes up then we might consider repeating back imaging   Also for pain, which doesn't quite correspond to treatment. Please ask your primary care doctor to refer you to pain clinic for early control of pain as potentially your body can develop complex regional pain syndrome which can be unpleasant   We'll start you on gabapentin for nerve pain Take 1 tablet at night. In 1 week if pain control is not as desired can increase to 2 tablet at night. Once you get to 3 tablet at night do not escalate.

## 2022-09-15 NOTE — Addendum Note (Signed)
Addended by: Caffie Pinto on: 09/15/2022 04:48 PM   Modules accepted: Orders

## 2022-09-16 ENCOUNTER — Emergency Department (HOSPITAL_COMMUNITY): Payer: Medicare HMO

## 2022-09-16 ENCOUNTER — Other Ambulatory Visit: Payer: Self-pay

## 2022-09-16 ENCOUNTER — Inpatient Hospital Stay (HOSPITAL_COMMUNITY)
Admission: EM | Admit: 2022-09-16 | Discharge: 2022-10-02 | DRG: 094 | Disposition: A | Discharge status: Skilled Nursing Facility | Payer: Medicare HMO | Attending: Internal Medicine | Admitting: Internal Medicine

## 2022-09-16 DIAGNOSIS — I499 Cardiac arrhythmia, unspecified: Secondary | ICD-10-CM | POA: Diagnosis not present

## 2022-09-16 DIAGNOSIS — I6523 Occlusion and stenosis of bilateral carotid arteries: Secondary | ICD-10-CM | POA: Diagnosis not present

## 2022-09-16 DIAGNOSIS — M8588 Other specified disorders of bone density and structure, other site: Secondary | ICD-10-CM | POA: Diagnosis not present

## 2022-09-16 DIAGNOSIS — R64 Cachexia: Secondary | ICD-10-CM | POA: Diagnosis present

## 2022-09-16 DIAGNOSIS — W19XXXA Unspecified fall, initial encounter: Secondary | ICD-10-CM

## 2022-09-16 DIAGNOSIS — Z8249 Family history of ischemic heart disease and other diseases of the circulatory system: Secondary | ICD-10-CM

## 2022-09-16 DIAGNOSIS — I4892 Unspecified atrial flutter: Secondary | ICD-10-CM | POA: Diagnosis present

## 2022-09-16 DIAGNOSIS — B9562 Methicillin resistant Staphylococcus aureus infection as the cause of diseases classified elsewhere: Secondary | ICD-10-CM | POA: Diagnosis present

## 2022-09-16 DIAGNOSIS — E236 Other disorders of pituitary gland: Secondary | ICD-10-CM | POA: Diagnosis present

## 2022-09-16 DIAGNOSIS — Z79899 Other long term (current) drug therapy: Secondary | ICD-10-CM

## 2022-09-16 DIAGNOSIS — S0990XA Unspecified injury of head, initial encounter: Secondary | ICD-10-CM | POA: Diagnosis not present

## 2022-09-16 DIAGNOSIS — F0390 Unspecified dementia without behavioral disturbance: Secondary | ICD-10-CM | POA: Diagnosis present

## 2022-09-16 DIAGNOSIS — E041 Nontoxic single thyroid nodule: Secondary | ICD-10-CM | POA: Diagnosis present

## 2022-09-16 DIAGNOSIS — R627 Adult failure to thrive: Secondary | ICD-10-CM | POA: Diagnosis present

## 2022-09-16 DIAGNOSIS — I120 Hypertensive chronic kidney disease with stage 5 chronic kidney disease or end stage renal disease: Secondary | ICD-10-CM | POA: Diagnosis present

## 2022-09-16 DIAGNOSIS — E440 Moderate protein-calorie malnutrition: Secondary | ICD-10-CM | POA: Diagnosis present

## 2022-09-16 DIAGNOSIS — D631 Anemia in chronic kidney disease: Secondary | ICD-10-CM | POA: Diagnosis present

## 2022-09-16 DIAGNOSIS — I160 Hypertensive urgency: Secondary | ICD-10-CM | POA: Diagnosis present

## 2022-09-16 DIAGNOSIS — S299XXA Unspecified injury of thorax, initial encounter: Secondary | ICD-10-CM | POA: Diagnosis not present

## 2022-09-16 DIAGNOSIS — S3993XA Unspecified injury of pelvis, initial encounter: Secondary | ICD-10-CM | POA: Diagnosis not present

## 2022-09-16 DIAGNOSIS — M549 Dorsalgia, unspecified: Secondary | ICD-10-CM | POA: Diagnosis not present

## 2022-09-16 DIAGNOSIS — Z91158 Patient's noncompliance with renal dialysis for other reason: Secondary | ICD-10-CM

## 2022-09-16 DIAGNOSIS — Z800 Family history of malignant neoplasm of digestive organs: Secondary | ICD-10-CM

## 2022-09-16 DIAGNOSIS — M48061 Spinal stenosis, lumbar region without neurogenic claudication: Secondary | ICD-10-CM | POA: Diagnosis present

## 2022-09-16 DIAGNOSIS — G9341 Metabolic encephalopathy: Secondary | ICD-10-CM | POA: Diagnosis present

## 2022-09-16 DIAGNOSIS — Z91013 Allergy to seafood: Secondary | ICD-10-CM

## 2022-09-16 DIAGNOSIS — I10 Essential (primary) hypertension: Secondary | ICD-10-CM

## 2022-09-16 DIAGNOSIS — Z801 Family history of malignant neoplasm of trachea, bronchus and lung: Secondary | ICD-10-CM

## 2022-09-16 DIAGNOSIS — B182 Chronic viral hepatitis C: Secondary | ICD-10-CM | POA: Diagnosis present

## 2022-09-16 DIAGNOSIS — Z992 Dependence on renal dialysis: Secondary | ICD-10-CM

## 2022-09-16 DIAGNOSIS — J45909 Unspecified asthma, uncomplicated: Secondary | ICD-10-CM | POA: Diagnosis present

## 2022-09-16 DIAGNOSIS — F05 Delirium due to known physiological condition: Secondary | ICD-10-CM | POA: Diagnosis not present

## 2022-09-16 DIAGNOSIS — M898X9 Other specified disorders of bone, unspecified site: Secondary | ICD-10-CM | POA: Diagnosis present

## 2022-09-16 DIAGNOSIS — R410 Disorientation, unspecified: Secondary | ICD-10-CM | POA: Diagnosis not present

## 2022-09-16 DIAGNOSIS — I480 Paroxysmal atrial fibrillation: Secondary | ICD-10-CM | POA: Diagnosis not present

## 2022-09-16 DIAGNOSIS — D696 Thrombocytopenia, unspecified: Secondary | ICD-10-CM | POA: Diagnosis present

## 2022-09-16 DIAGNOSIS — R7881 Bacteremia: Secondary | ICD-10-CM | POA: Diagnosis not present

## 2022-09-16 DIAGNOSIS — G062 Extradural and subdural abscess, unspecified: Principal | ICD-10-CM | POA: Diagnosis present

## 2022-09-16 DIAGNOSIS — R4182 Altered mental status, unspecified: Secondary | ICD-10-CM | POA: Diagnosis not present

## 2022-09-16 DIAGNOSIS — M2578 Osteophyte, vertebrae: Secondary | ICD-10-CM | POA: Diagnosis not present

## 2022-09-16 DIAGNOSIS — I953 Hypotension of hemodialysis: Secondary | ICD-10-CM | POA: Diagnosis not present

## 2022-09-16 DIAGNOSIS — G9389 Other specified disorders of brain: Secondary | ICD-10-CM | POA: Diagnosis not present

## 2022-09-16 DIAGNOSIS — N186 End stage renal disease: Secondary | ICD-10-CM | POA: Diagnosis present

## 2022-09-16 DIAGNOSIS — S6991XA Unspecified injury of right wrist, hand and finger(s), initial encounter: Secondary | ICD-10-CM | POA: Diagnosis not present

## 2022-09-16 DIAGNOSIS — Z7951 Long term (current) use of inhaled steroids: Secondary | ICD-10-CM

## 2022-09-16 DIAGNOSIS — I330 Acute and subacute infective endocarditis: Secondary | ICD-10-CM | POA: Diagnosis present

## 2022-09-16 DIAGNOSIS — N2581 Secondary hyperparathyroidism of renal origin: Secondary | ICD-10-CM | POA: Diagnosis not present

## 2022-09-16 DIAGNOSIS — M4647 Discitis, unspecified, lumbosacral region: Secondary | ICD-10-CM

## 2022-09-16 DIAGNOSIS — M109 Gout, unspecified: Secondary | ICD-10-CM | POA: Diagnosis not present

## 2022-09-16 DIAGNOSIS — I700 Atherosclerosis of aorta: Secondary | ICD-10-CM | POA: Diagnosis not present

## 2022-09-16 DIAGNOSIS — S199XXA Unspecified injury of neck, initial encounter: Secondary | ICD-10-CM | POA: Diagnosis not present

## 2022-09-16 DIAGNOSIS — G8929 Other chronic pain: Secondary | ICD-10-CM | POA: Diagnosis present

## 2022-09-16 DIAGNOSIS — J45998 Other asthma: Secondary | ICD-10-CM | POA: Diagnosis not present

## 2022-09-16 DIAGNOSIS — I318 Other specified diseases of pericardium: Secondary | ICD-10-CM | POA: Diagnosis not present

## 2022-09-16 DIAGNOSIS — I4891 Unspecified atrial fibrillation: Secondary | ICD-10-CM | POA: Diagnosis not present

## 2022-09-16 DIAGNOSIS — Z681 Body mass index (BMI) 19 or less, adult: Secondary | ICD-10-CM

## 2022-09-16 DIAGNOSIS — M47812 Spondylosis without myelopathy or radiculopathy, cervical region: Secondary | ICD-10-CM | POA: Diagnosis not present

## 2022-09-16 DIAGNOSIS — M2550 Pain in unspecified joint: Secondary | ICD-10-CM | POA: Diagnosis not present

## 2022-09-16 DIAGNOSIS — N250 Renal osteodystrophy: Secondary | ICD-10-CM | POA: Diagnosis not present

## 2022-09-16 DIAGNOSIS — G9340 Encephalopathy, unspecified: Secondary | ICD-10-CM | POA: Diagnosis not present

## 2022-09-16 DIAGNOSIS — M4646 Discitis, unspecified, lumbar region: Secondary | ICD-10-CM | POA: Diagnosis present

## 2022-09-16 DIAGNOSIS — M4626 Osteomyelitis of vertebra, lumbar region: Secondary | ICD-10-CM | POA: Diagnosis present

## 2022-09-16 DIAGNOSIS — Z7901 Long term (current) use of anticoagulants: Secondary | ICD-10-CM

## 2022-09-16 DIAGNOSIS — A4102 Sepsis due to Methicillin resistant Staphylococcus aureus: Secondary | ICD-10-CM | POA: Diagnosis not present

## 2022-09-16 DIAGNOSIS — Z7189 Other specified counseling: Secondary | ICD-10-CM | POA: Diagnosis not present

## 2022-09-16 DIAGNOSIS — Z7401 Bed confinement status: Secondary | ICD-10-CM | POA: Diagnosis not present

## 2022-09-16 LAB — CBC
HCT: 31.6 % — ABNORMAL LOW (ref 36.0–46.0)
Hemoglobin: 10 g/dL — ABNORMAL LOW (ref 12.0–15.0)
MCH: 33.1 pg (ref 26.0–34.0)
MCHC: 31.6 g/dL (ref 30.0–36.0)
MCV: 104.6 fL — ABNORMAL HIGH (ref 80.0–100.0)
Platelets: 100 10*3/uL — ABNORMAL LOW (ref 150–400)
RBC: 3.02 MIL/uL — ABNORMAL LOW (ref 3.87–5.11)
RDW: 18.8 % — ABNORMAL HIGH (ref 11.5–15.5)
WBC: 5.5 10*3/uL (ref 4.0–10.5)
nRBC: 0 % (ref 0.0–0.2)

## 2022-09-16 LAB — I-STAT CHEM 8, ED
BUN: 36 mg/dL — ABNORMAL HIGH (ref 8–23)
Calcium, Ion: 1.17 mmol/L (ref 1.15–1.40)
Chloride: 100 mmol/L (ref 98–111)
Creatinine, Ser: 7.3 mg/dL — ABNORMAL HIGH (ref 0.44–1.00)
Glucose, Bld: 88 mg/dL (ref 70–99)
HCT: 33 % — ABNORMAL LOW (ref 36.0–46.0)
Hemoglobin: 11.2 g/dL — ABNORMAL LOW (ref 12.0–15.0)
Potassium: 4.3 mmol/L (ref 3.5–5.1)
Sodium: 136 mmol/L (ref 135–145)
TCO2: 25 mmol/L (ref 22–32)

## 2022-09-16 LAB — CBC WITH DIFFERENTIAL/PLATELET
Absolute Monocytes: 432 cells/uL (ref 200–950)
Basophils Absolute: 19 cells/uL (ref 0–200)
Basophils Relative: 0.4 %
Eosinophils Absolute: 212 cells/uL (ref 15–500)
Eosinophils Relative: 4.5 %
HCT: 34.3 % — ABNORMAL LOW (ref 35.0–45.0)
Hemoglobin: 11.1 g/dL — ABNORMAL LOW (ref 11.7–15.5)
Lymphs Abs: 837 cells/uL — ABNORMAL LOW (ref 850–3900)
MCH: 33.1 pg — ABNORMAL HIGH (ref 27.0–33.0)
MCHC: 32.4 g/dL (ref 32.0–36.0)
MCV: 102.4 fL — ABNORMAL HIGH (ref 80.0–100.0)
MPV: 12.8 fL — ABNORMAL HIGH (ref 7.5–12.5)
Monocytes Relative: 9.2 %
Neutro Abs: 3201 cells/uL (ref 1500–7800)
Neutrophils Relative %: 68.1 %
Platelets: 129 10*3/uL — ABNORMAL LOW (ref 140–400)
RBC: 3.35 10*6/uL — ABNORMAL LOW (ref 3.80–5.10)
RDW: 16 % — ABNORMAL HIGH (ref 11.0–15.0)
Total Lymphocyte: 17.8 %
WBC: 4.7 10*3/uL (ref 3.8–10.8)

## 2022-09-16 LAB — COMPLETE METABOLIC PANEL WITH GFR
AG Ratio: 1.1 (calc) (ref 1.0–2.5)
ALT: 8 U/L (ref 6–29)
AST: 14 U/L (ref 10–35)
Albumin: 3.6 g/dL (ref 3.6–5.1)
Alkaline phosphatase (APISO): 55 U/L (ref 37–153)
BUN/Creatinine Ratio: 6 (calc) (ref 6–22)
BUN: 31 mg/dL — ABNORMAL HIGH (ref 7–25)
CO2: 30 mmol/L (ref 20–32)
Calcium: 10.2 mg/dL (ref 8.6–10.4)
Chloride: 97 mmol/L — ABNORMAL LOW (ref 98–110)
Creat: 5.28 mg/dL — ABNORMAL HIGH (ref 0.60–1.00)
Globulin: 3.4 g/dL (calc) (ref 1.9–3.7)
Glucose, Bld: 102 mg/dL — ABNORMAL HIGH (ref 65–99)
Potassium: 4.4 mmol/L (ref 3.5–5.3)
Sodium: 139 mmol/L (ref 135–146)
Total Bilirubin: 0.5 mg/dL (ref 0.2–1.2)
Total Protein: 7 g/dL (ref 6.1–8.1)
eGFR: 8 mL/min/{1.73_m2} — ABNORMAL LOW (ref >=60)

## 2022-09-16 LAB — COMPREHENSIVE METABOLIC PANEL
ALT: 10 U/L (ref 0–44)
AST: 14 U/L — ABNORMAL LOW (ref 15–41)
Albumin: 2.9 g/dL — ABNORMAL LOW (ref 3.5–5.0)
Alkaline Phosphatase: 40 U/L (ref 38–126)
Anion gap: 14 (ref 5–15)
BUN: 39 mg/dL — ABNORMAL HIGH (ref 8–23)
CO2: 25 mmol/L (ref 22–32)
Calcium: 10.2 mg/dL (ref 8.9–10.3)
Chloride: 99 mmol/L (ref 98–111)
Creatinine, Ser: 7.28 mg/dL — ABNORMAL HIGH (ref 0.44–1.00)
GFR, Estimated: 6 mL/min — ABNORMAL LOW (ref >60)
Glucose, Bld: 96 mg/dL (ref 70–99)
Potassium: 4.3 mmol/L (ref 3.5–5.1)
Sodium: 138 mmol/L (ref 135–145)
Total Bilirubin: 0.7 mg/dL (ref 0.3–1.2)
Total Protein: 6.5 g/dL (ref 6.5–8.1)

## 2022-09-16 LAB — PROTIME-INR
INR: 1.1 (ref 0.8–1.2)
Prothrombin Time: 13.7 seconds (ref 11.4–15.2)

## 2022-09-16 LAB — SAMPLE TO BLOOD BANK

## 2022-09-16 LAB — ETHANOL: Alcohol, Ethyl (B): <10 mg/dL (ref <10)

## 2022-09-16 LAB — C-REACTIVE PROTEIN: CRP: 27.4 mg/L — ABNORMAL HIGH (ref <8.0)

## 2022-09-16 LAB — LACTIC ACID, PLASMA: Lactic Acid, Venous: 1.6 mmol/L (ref 0.5–1.9)

## 2022-09-16 NOTE — ED Notes (Signed)
Trauma Response Nurse Documentation   Diamond [name] is a 71 y.o. female arriving to [facility] ED via EMS  On Eliquis (apixaban) daily. Trauma was activated as a Level 2 by ED charge RN based on the following trauma criteria Elderly patients > 65 with head trauma on anti-coagulation (excluding ASA). Trauma team at the bedside on patient arrival.   Patient cleared for CT by Dr. Maryan Rued. Pt transported to CT with primary ED nurse present to monitor. TRN met pt in CT. RN remained with the patient throughout their absence from the department for clinical observation.   GCS 14.  History   Past Medical History:  Diagnosis Date   Anemia    Asthma    Atrial flutter with rapid ventricular response ([place]) 12/25/2021   Chronic kidney disease    dialysis Tues Thurs Sat   Gout    Hypertension      Past Surgical History:  Procedure Laterality Date   AV FISTULA PLACEMENT Left 12/23/2021   Procedure: LEFT ARM BRACHIOBASILIC VEIN ARTERIOVENOUS (AV) FISTULA CREATION;  Surgeon: [name], MD;  Location: [facility];  Service: Vascular;  Laterality: Left;   [place] Left 03/08/2022   Procedure: LEFT SECOND STAGE BASILIC VEIN TRANSPOSITION;  Surgeon: [name], MD;  Location: [facility] OR;  Service: Vascular;  Laterality: Left;   BIOPSY  01/19/2022   Procedure: BIOPSY;  Surgeon: Lavena Bullion, DO;  Location: [facility]OSCOPY;  Service: Gastroenterology;;   [place] N/A 06/25/2022   Procedure: OPEN CHOLECYSTECTOMY;  Surgeon: Dwan Bolt, MD;  Location: [place];  Service: General;  Laterality: N/A;   COLONOSCOPY Left 01/19/2022   Procedure: COLONOSCOPY;  Surgeon: Lavena Bullion, DO;  Location: [place];  Service: Gastroenterology;  Laterality: Left;   ESOPHAGOGASTRODUODENOSCOPY Left 01/19/2022   Procedure: ESOPHAGOGASTRODUODENOSCOPY (EGD);  Surgeon: Lavena Bullion, DO;  Location[facility] ENDOSCOPY;  Service: Gastroenterology;  Laterality: Left;    INSERTION OF DIALYSIS CATHETER Right 12/23/2021   Procedure: INSERTION OF DIALYSIS CATHETER;  Surgeon: [name], MD;  Location: [facility];  Service: Vascular;  Laterality: Right;   LAPAROSCOPY N/A 06/25/2022   Procedure: STAGING LAPAROSCOPY;  Surgeon: Dwan Bolt, MD;  Location: [place];  Service: General;  Laterality: N/A;   OPEN PARTIAL HEPATECTOMY  N/A 06/25/2022   Procedure: OPEN PARTIAL HEPATECTOMY;  Surgeon: Dwan Bolt, MD;  Location: [place];  Service: General;  Laterality: N/A;   POLYPECTOMY  01/19/2022   Procedure: POLYPECTOMY;  Surgeon: Lavena Bullion, DO;  Location: [facility] [facility]OSCOPY;  Service: Gastroenterology;;   TEE WITHOUT CARDIOVERSION N/A 12/22/2021   Procedure: TRANSESOPHAGEAL ECHOCARDIOGRAM (TEE);  Surgeon: Jerline Pain, MD;  Location[facility];  Service: Cardiovascular;  Laterality: N/A;   TEE WITHOUT CARDIOVERSION N/A 06/17/2022   Procedure: TRANSESOPHAGEAL ECHOCARDIOGRAM (TEE);  Surgeon: Buford Dresser, MD;  Location: [place];  Service: Cardiovascular;  Laterality: N/A;   ULTRASOUND GUIDANCE FOR VASCULAR ACCESS  12/23/2021   Procedure: ULTRASOUND GUIDANCE FOR VASCULAR ACCESS;  Surgeon: [name], MD;  Location: [facility] OR;  Service: Vascular;;       Initial Focused Assessment (If applicable, or please see trauma documentation): Alert, confused female arrives from home after a fall, reports hitting her head while attempting to stand from a seated position Airway patent/unobstructed, BS clear No obvious uncontrolled hemorrhage GCS 14   CT's Completed:   CT Head and CT C-Spine  CT L spine  Interventions:  Trauma lab draw  Unsuccessful IV attempts x2 by myself CT head, c&L spine Portable chest, pelvis and right wrist XRAY  Plan for disposition:  Pending imaging  Consults completed:  none at the time of this note.  Event Summary: Patient arrives via EMS from home after an unwitnessed fall, pt reports hitting her head. Hx dialysis, per EMS  report she has missed the last two treatments and has been progressively more confused.   MTP Summary (If applicable): NA  Bedside handoff with ED RN Diamond [name].    Diamond [name]  Trauma Response RN  Please call TRN at [phone] for further assistance.

## 2022-09-16 NOTE — ED Notes (Signed)
Patient transported to CT with primary RN. 

## 2022-09-16 NOTE — ED Triage Notes (Signed)
Pt arrived via [facility] for level 2, fall on thinners (Eliquis). Pt suffered unwitnessed mechanical fall forward to carpet floor when standing from seated position on couch, no LOC. Daughter heard fall from other room and found pt on floor. Pt confused at this time, pt report this is at baseline especially after missing dialysis treatments - pt has missed the last two. Known lumbar fx with chronic pain. Refused ccollar.  BP  HR 80 NS

## 2022-09-16 NOTE — ED Provider Notes (Signed)
[facility] Emergency Department Provider Note MRN:  [id number]  Arrival date & time: 09/17/22     Chief Complaint   Fall and Back Pain   History of Present Illness   Diamond [name] is a 71 y.o. year-old female presents to the ED with chief complaint of fall.  [name] EMS as a level 2 trauma per department protocol for a fall on thinners.  Patient takes Eliquis.  Reportedly, patient had and unwitnessed fall from standing after getting up off of the couch.  Reportedly hit the back of her head. Patient has hx of known lumbar fx.  Family reports that she has missed her last 2 dialysis sessions and has had increasing confusion.  Patient complains of right wrist pain.  History provided by patient.   Review of Systems  Pertinent positive and negative review of systems noted in HPI.    Physical Exam   Vitals:   09/17/22 0012 09/17/22 0015  BP: (!) 207/116 (!) 206/124  Pulse: 87 94  Resp: 20 13  Temp:    SpO2: 98% 100%    CONSTITUTIONAL:  chronically ill-appearing, NAD NEURO:  Alert but confused, GCS 14 EYES:  eyes equal and reactive ENT/NECK:  Supple, no stridor  CARDIO:  normal rate, regular rhythm, appears well-perfused  PULM:  No respiratory distress, CTAB GI/GU:  non-distended, non tender MSK/SPINE:  No gross deformities, no edema, moves all extremities, no leg shortening, right wrist without  SKIN:  no rash, atraumatic   *Additional and/or pertinent findings included in MDM below  Diagnostic and Interventional Summary    EKG Interpretation  Date/Time:    Ventricular Rate:    PR Interval:    QRS Duration:   QT Interval:    QTC Calculation:   R Axis:     Text Interpretation:         Labs Reviewed  COMPREHENSIVE METABOLIC PANEL - Abnormal; Notable for the following components:      Result Value   BUN 39 (*)    Creatinine, Ser 7.28 (*)    Albumin 2.9 (*)    AST 14 (*)    GFR, Estimated 6 (*)    All other components within  normal limits  CBC - Abnormal; Notable for the following components:   RBC 3.02 (*)    Hemoglobin 10.0 (*)    HCT 31.6 (*)    MCV 104.6 (*)    RDW 18.8 (*)    Platelets 100 (*)    All other components within normal limits  I-STAT CHEM 8, ED - Abnormal; Notable for the following components:   BUN 36 (*)    Creatinine, Ser 7.30 (*)    Hemoglobin 11.2 (*)    HCT 33.0 (*)    All other components within normal limits  ETHANOL  LACTIC ACID, PLASMA  PROTIME-INR  URINALYSIS, ROUTINE W REFLEX MICROSCOPIC  AMMONIA  SAMPLE TO BLOOD BANK    CT Cervical Spine Wo Contrast  Final Result    CT Lumbar Spine Wo Contrast  Final Result    CT HEAD WO CONTRAST (5MM)  Final Result    DG Chest Port 1 View  Final Result    DG Pelvis Portable  Final Result    DG Wrist Complete Right  Final Result      Medications  labetalol (NORMODYNE) injection 10-20 mg (has no administration in time range)     Procedures  /  Critical Care .Critical Care  Performed by: Montine Circle, PA-C  Authorized by: Montine Circle, PA-C   Critical care provider statement:    Critical care time (minutes):  3   Critical care was necessary to treat or prevent imminent or life-threatening deterioration of the following conditions: hypertensive urgency, AMS.   Critical care was time spent personally by me on the following activities:  Development of treatment plan with patient or surrogate, discussions with consultants, evaluation of patient's response to treatment, examination of patient, ordering and review of laboratory studies, ordering and review of radiographic studies, ordering and performing treatments and interventions, pulse oximetry, re-evaluation of patient's condition and review of old charts   ED Course and Medical Decision Making  I have reviewed the triage vital signs, the nursing notes, and pertinent available records from the EMR.  Social Determinants Affecting Complexity of Care: Patient has  no clinically significant social determinants affecting this chief complaint..   ED Course:    Medical Decision Making Patient here after a fall at home.  Per family the patient is more confused than at baseline and patient has missed her last 2 dialysis appointments.    I attempted to reach family, but was unsuccessful.  Patient is noted to be hypertensive to the 342A systolic.  No effusion seen on CXR.  CTs scans are reassuring, no new traumatic findings.  Given intermittent confusion, severe hypertension, feel that patient would likely benefit from admission and dialysis.  Discussed case with Dr. Laverta Baltimore, who agrees with plan.  I will consult hospitalist for admission.  Problems Addressed: Confusion: acute illness or injury Fall, initial encounter: acute illness or injury Hypertension, unspecified type: chronic illness or injury that poses a threat to life or bodily functions  Amount and/or Complexity of Data Reviewed Labs: ordered.    Details: BUN 36, K is 4.3.  Intermittently confused and hypertensive.  Might need HD. Radiology: ordered and independent interpretation performed.    Details: No effusion on CXR, no obvious hip fracture, no large ICH  Risk Decision regarding hospitalization.     Consultants: I discussed the case with Hospitalist, Dr. Alcario Drought, who is appreciated for admitting.   Treatment and Plan: Patient's exam and diagnostic results are concerning for hypertensive urgency, AMS, needing dialysis.  Feel that patient will need admission to the hospital for further treatment and evaluation.  Patient discussed with attending physician, Dr. Laverta Baltimore, who agrees with plan.  Final Clinical Impressions(s) / ED Diagnoses     ICD-10-CM   1. Confusion  R41.0     2. Fall, initial encounter  W19.XXXA     3. Hypertension, unspecified type  I10       ED Discharge Orders     None         Discharge Instructions Discussed with and Provided to Patient:   Discharge  Instructions   None      Montine Circle, PA-C 09/17/22 0121    Margette Fast, MD 09/17/22 [phone]

## 2022-09-16 NOTE — ED Notes (Signed)
Attempted IV in Right AC x2 without success. EMS IV in L AC on the same side as fistula, needs to be removed.

## 2022-09-17 ENCOUNTER — Inpatient Hospital Stay (HOSPITAL_COMMUNITY): Payer: Medicare HMO

## 2022-09-17 ENCOUNTER — Observation Stay (HOSPITAL_COMMUNITY): Payer: Medicare HMO

## 2022-09-17 DIAGNOSIS — N250 Renal osteodystrophy: Secondary | ICD-10-CM | POA: Diagnosis not present

## 2022-09-17 DIAGNOSIS — M4646 Discitis, unspecified, lumbar region: Secondary | ICD-10-CM

## 2022-09-17 DIAGNOSIS — M109 Gout, unspecified: Secondary | ICD-10-CM | POA: Diagnosis not present

## 2022-09-17 DIAGNOSIS — B9562 Methicillin resistant Staphylococcus aureus infection as the cause of diseases classified elsewhere: Secondary | ICD-10-CM | POA: Diagnosis not present

## 2022-09-17 DIAGNOSIS — M4626 Osteomyelitis of vertebra, lumbar region: Secondary | ICD-10-CM | POA: Diagnosis present

## 2022-09-17 DIAGNOSIS — I480 Paroxysmal atrial fibrillation: Secondary | ICD-10-CM | POA: Diagnosis present

## 2022-09-17 DIAGNOSIS — N186 End stage renal disease: Secondary | ICD-10-CM | POA: Diagnosis present

## 2022-09-17 DIAGNOSIS — I160 Hypertensive urgency: Secondary | ICD-10-CM

## 2022-09-17 DIAGNOSIS — J45998 Other asthma: Secondary | ICD-10-CM | POA: Diagnosis not present

## 2022-09-17 DIAGNOSIS — I10 Essential (primary) hypertension: Secondary | ICD-10-CM | POA: Diagnosis not present

## 2022-09-17 DIAGNOSIS — G062 Extradural and subdural abscess, unspecified: Secondary | ICD-10-CM | POA: Diagnosis present

## 2022-09-17 DIAGNOSIS — I4891 Unspecified atrial fibrillation: Secondary | ICD-10-CM | POA: Diagnosis not present

## 2022-09-17 DIAGNOSIS — Z7189 Other specified counseling: Secondary | ICD-10-CM | POA: Diagnosis not present

## 2022-09-17 DIAGNOSIS — M48061 Spinal stenosis, lumbar region without neurogenic claudication: Secondary | ICD-10-CM | POA: Diagnosis not present

## 2022-09-17 DIAGNOSIS — I120 Hypertensive chronic kidney disease with stage 5 chronic kidney disease or end stage renal disease: Secondary | ICD-10-CM | POA: Diagnosis present

## 2022-09-17 DIAGNOSIS — R410 Disorientation, unspecified: Secondary | ICD-10-CM | POA: Diagnosis present

## 2022-09-17 DIAGNOSIS — E041 Nontoxic single thyroid nodule: Secondary | ICD-10-CM | POA: Diagnosis present

## 2022-09-17 DIAGNOSIS — B182 Chronic viral hepatitis C: Secondary | ICD-10-CM | POA: Diagnosis present

## 2022-09-17 DIAGNOSIS — G9340 Encephalopathy, unspecified: Secondary | ICD-10-CM

## 2022-09-17 DIAGNOSIS — R7881 Bacteremia: Secondary | ICD-10-CM | POA: Diagnosis present

## 2022-09-17 DIAGNOSIS — D631 Anemia in chronic kidney disease: Secondary | ICD-10-CM | POA: Diagnosis present

## 2022-09-17 DIAGNOSIS — G9341 Metabolic encephalopathy: Secondary | ICD-10-CM | POA: Diagnosis present

## 2022-09-17 DIAGNOSIS — F0390 Unspecified dementia without behavioral disturbance: Secondary | ICD-10-CM | POA: Diagnosis present

## 2022-09-17 DIAGNOSIS — F05 Delirium due to known physiological condition: Secondary | ICD-10-CM | POA: Diagnosis not present

## 2022-09-17 DIAGNOSIS — I4892 Unspecified atrial flutter: Secondary | ICD-10-CM

## 2022-09-17 DIAGNOSIS — Z681 Body mass index (BMI) 19 or less, adult: Secondary | ICD-10-CM | POA: Diagnosis not present

## 2022-09-17 DIAGNOSIS — I318 Other specified diseases of pericardium: Secondary | ICD-10-CM | POA: Diagnosis not present

## 2022-09-17 DIAGNOSIS — M898X9 Other specified disorders of bone, unspecified site: Secondary | ICD-10-CM | POA: Diagnosis present

## 2022-09-17 DIAGNOSIS — W19XXXA Unspecified fall, initial encounter: Secondary | ICD-10-CM

## 2022-09-17 DIAGNOSIS — E236 Other disorders of pituitary gland: Secondary | ICD-10-CM | POA: Diagnosis present

## 2022-09-17 DIAGNOSIS — Z992 Dependence on renal dialysis: Secondary | ICD-10-CM | POA: Diagnosis not present

## 2022-09-17 DIAGNOSIS — I499 Cardiac arrhythmia, unspecified: Secondary | ICD-10-CM | POA: Diagnosis not present

## 2022-09-17 DIAGNOSIS — R64 Cachexia: Secondary | ICD-10-CM | POA: Diagnosis present

## 2022-09-17 DIAGNOSIS — N2581 Secondary hyperparathyroidism of renal origin: Secondary | ICD-10-CM | POA: Diagnosis not present

## 2022-09-17 DIAGNOSIS — A4102 Sepsis due to Methicillin resistant Staphylococcus aureus: Secondary | ICD-10-CM | POA: Diagnosis not present

## 2022-09-17 DIAGNOSIS — D696 Thrombocytopenia, unspecified: Secondary | ICD-10-CM | POA: Diagnosis present

## 2022-09-17 DIAGNOSIS — E440 Moderate protein-calorie malnutrition: Secondary | ICD-10-CM | POA: Diagnosis present

## 2022-09-17 DIAGNOSIS — R4182 Altered mental status, unspecified: Secondary | ICD-10-CM | POA: Diagnosis not present

## 2022-09-17 DIAGNOSIS — I330 Acute and subacute infective endocarditis: Secondary | ICD-10-CM | POA: Diagnosis present

## 2022-09-17 DIAGNOSIS — J45909 Unspecified asthma, uncomplicated: Secondary | ICD-10-CM | POA: Diagnosis present

## 2022-09-17 DIAGNOSIS — R627 Adult failure to thrive: Secondary | ICD-10-CM | POA: Diagnosis present

## 2022-09-17 LAB — BASIC METABOLIC PANEL
Anion gap: 11 (ref 5–15)
BUN: 42 mg/dL — ABNORMAL HIGH (ref 8–23)
CO2: 26 mmol/L (ref 22–32)
Calcium: 10 mg/dL (ref 8.9–10.3)
Chloride: 100 mmol/L (ref 98–111)
Creatinine, Ser: 7.53 mg/dL — ABNORMAL HIGH (ref 0.44–1.00)
GFR, Estimated: 5 mL/min — ABNORMAL LOW (ref >60)
Glucose, Bld: 90 mg/dL (ref 70–99)
Potassium: 5.4 mmol/L — ABNORMAL HIGH (ref 3.5–5.1)
Sodium: 137 mmol/L (ref 135–145)

## 2022-09-17 LAB — URINALYSIS, ROUTINE W REFLEX MICROSCOPIC
Bilirubin Urine: NEGATIVE
Glucose, UA: NEGATIVE mg/dL
Hgb urine dipstick: NEGATIVE
Ketones, ur: NEGATIVE mg/dL
Leukocytes,Ua: NEGATIVE
Nitrite: NEGATIVE
Protein, ur: 100 mg/dL — AB
Specific Gravity, Urine: 1.008 (ref 1.005–1.030)
pH: 9 — ABNORMAL HIGH (ref 5.0–8.0)

## 2022-09-17 LAB — CBC
HCT: 29.4 % — ABNORMAL LOW (ref 36.0–46.0)
Hemoglobin: 9.5 g/dL — ABNORMAL LOW (ref 12.0–15.0)
MCH: 33 pg (ref 26.0–34.0)
MCHC: 32.3 g/dL (ref 30.0–36.0)
MCV: 102.1 fL — ABNORMAL HIGH (ref 80.0–100.0)
Platelets: 99 10*3/uL — ABNORMAL LOW (ref 150–400)
RBC: 2.88 MIL/uL — ABNORMAL LOW (ref 3.87–5.11)
RDW: 18.6 % — ABNORMAL HIGH (ref 11.5–15.5)
WBC: 6.5 10*3/uL (ref 4.0–10.5)
nRBC: 0 % (ref 0.0–0.2)

## 2022-09-17 LAB — AMMONIA: Ammonia: 24 umol/L (ref 9–35)

## 2022-09-17 LAB — PROCALCITONIN: Procalcitonin: 0.62 ng/mL

## 2022-09-17 LAB — C-REACTIVE PROTEIN: CRP: 19.3 mg/dL — ABNORMAL HIGH (ref <1.0)

## 2022-09-17 MED ORDER — CHLORHEXIDINE GLUCONATE CLOTH 2 % EX PADS: 6.0000 | MEDICATED_PAD | Freq: Every day | CUTANEOUS | Status: DC

## 2022-09-17 MED ORDER — PANTOPRAZOLE SODIUM 40 MG PO TBEC
40.0000 mg | DELAYED_RELEASE_TABLET | Freq: Two times a day (BID) | ORAL | Status: DC
  Administered 2022-09-17 – 2022-10-01 (×26): 40 mg via ORAL
  Filled 2022-09-17 (×29): qty 1

## 2022-09-17 MED ORDER — DILTIAZEM LOAD VIA INFUSION
10.0000 mg | Freq: Once | INTRAVENOUS | Status: AC
  Administered 2022-09-17: 10 mg via INTRAVENOUS
  Filled 2022-09-17: qty 10

## 2022-09-17 MED ORDER — VANCOMYCIN VARIABLE DOSE PER UNSTABLE RENAL FUNCTION (PHARMACIST DOSING): Status: DC

## 2022-09-17 MED ORDER — HYDROMORPHONE HCL 1 MG/ML IJ SOLN: 0.5000 mg | Freq: Once | INTRAMUSCULAR | Status: DC | PRN

## 2022-09-17 MED ORDER — HEPARIN SODIUM (PORCINE) 5000 UNIT/ML IJ SOLN: 5000.0000 [IU] | Freq: Three times a day (TID) | INTRAMUSCULAR | Status: DC

## 2022-09-17 MED ORDER — OXYCODONE-ACETAMINOPHEN 5-325 MG PO TABS
1.0000 | ORAL_TABLET | Freq: Three times a day (TID) | ORAL | Status: DC | PRN
  Administered 2022-09-18 – 2022-09-23 (×4): 1 via ORAL
  Filled 2022-09-17 (×3): qty 1

## 2022-09-17 MED ORDER — VANCOMYCIN HCL 500 MG/100ML IV SOLN
500.0000 mg | INTRAVENOUS | Status: DC
  Administered 2022-09-18 – 2022-09-21 (×2): 500 mg via INTRAVENOUS
  Filled 2022-09-17 (×6): qty 100

## 2022-09-17 MED ORDER — DILTIAZEM HCL-DEXTROSE 125-5 MG/125ML-% IV SOLN (PREMIX)
5.0000 mg/h | INTRAVENOUS | Status: DC
  Administered 2022-09-17: 5 mg/h via INTRAVENOUS
  Filled 2022-09-17 (×2): qty 125

## 2022-09-17 MED ORDER — ONDANSETRON HCL 4 MG PO TABS: 4.0000 mg | ORAL_TABLET | Freq: Four times a day (QID) | ORAL | Status: DC | PRN

## 2022-09-17 MED ORDER — APIXABAN 2.5 MG PO TABS
2.5000 mg | ORAL_TABLET | Freq: Two times a day (BID) | ORAL | Status: DC
  Administered 2022-09-17 – 2022-10-01 (×26): 2.5 mg via ORAL
  Filled 2022-09-17 (×31): qty 1

## 2022-09-17 MED ORDER — MOMETASONE FURO-FORMOTEROL FUM 200-5 MCG/ACT IN AERO
2.0000 | INHALATION_SPRAY | Freq: Two times a day (BID) | RESPIRATORY_TRACT | Status: DC
  Administered 2022-09-18 – 2022-10-01 (×25): 2 via RESPIRATORY_TRACT
  Filled 2022-09-17 (×2): qty 8.8

## 2022-09-17 MED ORDER — METOPROLOL TARTRATE 50 MG PO TABS
50.0000 mg | ORAL_TABLET | Freq: Two times a day (BID) | ORAL | Status: DC
  Administered 2022-09-17 – 2022-10-01 (×23): 50 mg via ORAL
  Filled 2022-09-17 (×14): qty 1
  Filled 2022-09-17: qty 2
  Filled 2022-09-17 (×13): qty 1

## 2022-09-17 MED ORDER — ACETAMINOPHEN 650 MG RE SUPP: 650.0000 mg | Freq: Four times a day (QID) | RECTAL | Status: DC | PRN

## 2022-09-17 MED ORDER — CHLORHEXIDINE GLUCONATE CLOTH 2 % EX PADS
6.0000 | MEDICATED_PAD | Freq: Every day | CUTANEOUS | Status: DC
  Administered 2022-09-18 – 2022-09-21 (×3): 6 via TOPICAL

## 2022-09-17 MED ORDER — HYDRALAZINE HCL 50 MG PO TABS
100.0000 mg | ORAL_TABLET | ORAL | Status: DC
  Administered 2022-09-17 – 2022-10-01 (×22): 100 mg via ORAL
  Filled 2022-09-17 (×23): qty 2
  Filled 2022-09-17: qty 4
  Filled 2022-09-17 (×2): qty 2

## 2022-09-17 MED ORDER — LABETALOL HCL 5 MG/ML IV SOLN
10.0000 mg | INTRAVENOUS | Status: DC | PRN
  Administered 2022-09-17: 10 mg via INTRAVENOUS
  Administered 2022-09-18 – 2022-09-19 (×3): 20 mg via INTRAVENOUS
  Administered 2022-09-28: 10 mg via INTRAVENOUS
  Filled 2022-09-17 (×6): qty 4

## 2022-09-17 MED ORDER — ALBUTEROL SULFATE (2.5 MG/3ML) 0.083% IN NEBU: 2.5000 mg | INHALATION_SOLUTION | RESPIRATORY_TRACT | Status: DC | PRN

## 2022-09-17 MED ORDER — SODIUM CHLORIDE 0.9 % IV SOLN
1.0000 g | INTRAVENOUS | Status: DC
  Administered 2022-09-17: 1 g via INTRAVENOUS
  Filled 2022-09-17 (×2): qty 10

## 2022-09-17 MED ORDER — ACETAMINOPHEN 325 MG PO TABS
650.0000 mg | ORAL_TABLET | Freq: Four times a day (QID) | ORAL | Status: DC | PRN
  Administered 2022-09-17 – 2022-09-25 (×4): 650 mg via ORAL
  Filled 2022-09-17 (×5): qty 2

## 2022-09-17 MED ORDER — HYDRALAZINE HCL 50 MG PO TABS
100.0000 mg | ORAL_TABLET | ORAL | Status: DC
  Administered 2022-09-18 – 2022-09-28 (×8): 100 mg via ORAL
  Filled 2022-09-17 (×9): qty 2

## 2022-09-17 MED ORDER — VANCOMYCIN HCL 1250 MG/250ML IV SOLN
1250.0000 mg | Freq: Once | INTRAVENOUS | Status: AC
  Administered 2022-09-17: 1250 mg via INTRAVENOUS
  Filled 2022-09-17: qty 250

## 2022-09-17 MED ORDER — SEVELAMER CARBONATE 800 MG PO TABS
1600.0000 mg | ORAL_TABLET | Freq: Three times a day (TID) | ORAL | Status: DC
  Administered 2022-09-17 – 2022-10-01 (×27): 1600 mg via ORAL
  Filled 2022-09-17 (×30): qty 2

## 2022-09-17 MED ORDER — ONDANSETRON HCL 4 MG/2ML IJ SOLN: 4.0000 mg | Freq: Four times a day (QID) | INTRAMUSCULAR | Status: DC | PRN

## 2022-09-17 NOTE — Consult Note (Signed)
Date of Admission:  09/16/2022          Reason for Consult: History of MRSA bacteremia and vertebral discitis now with fall and back pain   Referring Provider: Lesia Sago, MD   Assessment:  Mechanical fall with confusion End-stage renal disease on hemodialysis having missed recent dialysis History of MRSA bacteremia and vertebral discitis osteomyelitis Atrial flutter  Plan:  Obtain MRI of the lumbar spine with out contrast Check esr, crp Have discontinued the cefepime Continue vancomycin for now  Dr. 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    Paroxysmal atrial flutter (HCC)   Hypertensive urgency   Thyroid nodule   Empty sella (HCC)   Scheduled Meds:  apixaban  2.5 mg Oral BID   Chlorhexidine Gluconate Cloth  6 each Topical Q0600   [START ON 09/18/2022] hydrALAZINE  100 mg Oral 2 times per day on Tue Thu Sat   hydrALAZINE  100 mg Oral 3 times per day on Sun Mon Wed Fri   metoprolol tartrate  50 mg Oral BID   mometasone-formoterol  2 puff Inhalation BID   pantoprazole  40 mg Oral BID   sevelamer carbonate  1,600 mg Oral TID WC   vancomycin variable dose per unstable renal function (pharmacist dosing)   Does not apply See admin instructions   Continuous Infusions:  [START ON 09/18/2022] vancomycin     PRN Meds:.acetaminophen **OR** acetaminophen, albuterol, HYDROmorphone (DILAUDID) injection, labetalol, ondansetron **OR** ondansetron (ZOFRAN) IV, oxyCODONE-acetaminophen  HPI: Diamond [name] is a 71 y.o. female with paroxysmal atrial flutter, end-stage renal disease on hemodialysis who had recurrent MRSA bacteremia.  She was treated with 4 weeks of vancomycin in August but then was readmitted on August 2 22nd with acute on chronic back pain and an MRI concerning for L2-L3 paravertebral edema and  potential vertebral osteomyelitis discitis.  She was placed on oral Bactrim daily with plans to complete a total of 8 weeks on September 19.  She is on my partner Dr. Gale Journey on October 11 that was still suffering from significant back pain.  Would plan on checking inflammatory markers and making decision based on that and whether to obtain an MRI does not appear that the patient had blood drawn during the visit.  She is now brought to the hospital after she suffered a mechanical fall at home and was quite confused.  Apparently she had missed 2 dialysis sessions.  He tells me that her family is making too much of a big deal of the fact that she was confused.  She does not seem to understand the significance of missing dialysis and how it is a life-threatening condition to have end-stage renal disease and not undergo hemodialysis.  She does complain quite a bit about her back pain and wants to understand why it is still bothering her so much.  Since admission to the ER had blood cultures taken and she was started on vancomycin and cefepime though I cannot completely understand why these were both started.  She has undergone CT cervical spine CT had CT lumbar spine chest x-ray wrist films ultrasound of the thyroid all of which were unrevealing for infection.  I think we do need to get an MRI of her lumbar spine to evaluate the area of prior discitis vertebral osteomyelitis.  If she needs  more protracted therapy we could certainly consider giving her vancomycin again with dialysis.  Hopefully this is not the case and she has had resolution of her infection.   I spent 82 minutes with the patient including than 50% of the time in face to face counseling of the patient and her recurrent MRSA bacteremia discitis vertebral osteomyelitis, personally reviewing the head CT spine CT lumbar spine ultrasound thyroid plain films pelvis chest x-ray wrist films along with review of medical records in preparation for  the visit and during the visit and in coordination of her care.    Review of Systems: Review of Systems  Unable to perform ROS: Acuity of condition  Musculoskeletal:  Positive for back pain and myalgias.    Past Medical History:  Diagnosis Date   Anemia    Asthma    Atrial flutter with rapid ventricular response ([place]) 12/25/2021   Chronic kidney disease    dialysis Tues Thurs Sat   Gout    Hypertension     Social History   Tobacco Use   Smoking status: Never    Passive exposure: Never   Smokeless tobacco: Never  Vaping Use   Vaping Use: Never used  Substance Use Topics   Alcohol use: No   Drug use: No    Family History  Problem Relation Age of Onset   Hypertension Mother    Hypertension Father    Colon cancer Brother    Lung cancer Brother    Allergies  Allergen Reactions   Shellfish Allergy Other (See Comments)    Unknown reaction    Other Rash    Pt states anesthesia makes her brake out in rash    OBJECTIVE: Blood pressure (!) 198/134, pulse 82, temperature 98.4 F (36.9 C), temperature source Oral, resp. rate 17, height 5' 6" (1.676 m), weight 50 kg, SpO2 98 %.  Physical Exam Constitutional:      General: She is not in acute distress.    Appearance: Normal appearance. She is well-developed. She is not ill-appearing or diaphoretic.  HENT:     Head: Normocephalic and atraumatic.     Right Ear: Hearing and external ear normal.     Left Ear: Hearing and external ear normal.     Nose: No nasal deformity or rhinorrhea.  Eyes:     General: No scleral icterus.    Conjunctiva/sclera: Conjunctivae normal.     Right eye: Right conjunctiva is not injected.     Left eye: Left conjunctiva is not injected.     Pupils: Pupils are equal, round, and reactive to light.  Neck:     Vascular: No JVD.  Cardiovascular:     Rate and Rhythm: Normal rate and regular rhythm.     Heart sounds: Normal heart sounds, S1 normal and S2 normal. No murmur heard.    No friction  rub.  Pulmonary:     Effort: Pulmonary effort is normal. No respiratory distress.     Breath sounds: Normal breath sounds. No stridor. No wheezing or rhonchi.  Abdominal:     General: Bowel sounds are normal. There is no distension.     Palpations: Abdomen is soft.     Tenderness: There is no abdominal tenderness.  Musculoskeletal:        General: Normal range of motion.     Right shoulder: Normal.     Left shoulder: Normal.     Cervical back: Normal range of motion and neck supple.     Right hip:  Normal.     Left hip: Normal.     Right knee: Normal.     Left knee: Normal.  Lymphadenopathy:     Head:     Right side of head: No submandibular, preauricular or posterior auricular adenopathy.     Left side of head: No submandibular, preauricular or posterior auricular adenopathy.     Cervical: No cervical adenopathy.     Right cervical: No superficial or deep cervical adenopathy.    Left cervical: No superficial or deep cervical adenopathy.  Skin:    General: Skin is warm and dry.     Coloration: Skin is not pale.     Findings: No abrasion, bruising, ecchymosis, erythema, lesion or rash.     Nails: There is no clubbing.  Neurological:     General: No focal deficit present.     Mental Status: She is alert.     Sensory: No sensory deficit.     Coordination: Coordination normal.     Gait: Gait normal.  Psychiatric:        Attention and Perception: She is attentive.        Mood and Affect: Mood normal.        Speech: Speech normal.        Behavior: Behavior normal. Behavior is cooperative.        Thought Content: Thought content normal.        Judgment: Judgment normal.    Fistula appears clean Lab Results Lab Results  Component Value Date   WBC 6.5 09/17/2022   HGB 9.5 (L) 09/17/2022   HCT 29.4 (L) 09/17/2022   MCV 102.1 (H) 09/17/2022   PLT 99 (L) 09/17/2022    Lab Results  Component Value Date   CREATININE 7.53 (H) 09/17/2022   BUN 42 (H) 09/17/2022   NA 137  09/17/2022   K 5.4 (H) 09/17/2022   CL 100 09/17/2022   CO2 26 09/17/2022    Lab Results  Component Value Date   ALT 10 09/16/2022   AST 14 (L) 09/16/2022   ALKPHOS 40 09/16/2022   BILITOT 0.7 09/16/2022     Microbiology: Recent Results (from the past 240 hour(s))  Culture, blood (Routine X 2) w Reflex to ID Panel     Status: None (Preliminary result)   Collection Time: 09/17/22  7:58 AM   Specimen: BLOOD RIGHT ARM  Result Value Ref Range Status   Specimen Description BLOOD RIGHT ARM  Final   Special Requests   Final    BOTTLES DRAWN AEROBIC AND ANAEROBIC Blood Culture adequate volume   Culture   Final    NO GROWTH < 12 HOURS Performed at [facility], [address] [place], [place] [address]    Report Status PENDING  Incomplete  Culture, blood (Routine X 2) w Reflex to ID Panel     Status: None (Preliminary result)   Collection Time: 09/17/22  8:03 AM   Specimen: BLOOD RIGHT HAND  Result Value Ref Range Status   Specimen Description BLOOD RIGHT HAND  Final   Special Requests   Final    BOTTLES DRAWN AEROBIC ONLY Blood Culture results may not be optimal due to an inadequate volume of blood received in culture bottles   Culture   Final    NO GROWTH < 12 HOURS Performed at [facility], [place] [address], [place], [place] [address]    Report Status PENDING  Incomplete    Alcide Evener, [place]  for Infectious [facility] [phone] pager  09/17/2022, 3:22 PM

## 2022-09-17 NOTE — Assessment & Plan Note (Signed)
Cont home metoprolol Cont home eliquis

## 2022-09-17 NOTE — ED Notes (Signed)
Patient transported to MRI 

## 2022-09-17 NOTE — H&P (Signed)
History and Physical    Patient: Diamond [name] QMG:[id number] DOB: [birth year] DOA: 09/16/2022 DOS: the patient was seen and examined on 09/17/2022 PCP: [name], MD  Patient coming from: Home  Chief Complaint:  Chief Complaint  Patient presents with   Fall   Back Pain   HPI: Diamond [name] is a 71 y.o. female with medical history significant of ESRD on HD, PAF on eliquis, HTN.  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  Recently finished 4 weeks of IV ABx (looks like vanc) 8/8.  Readmitted 8/22 for acute on chronic back pain with MRI concerning for l2-3 paravertebral edema and still elevated crp.  Treated with another 4 weeks of Bactrim PO.  Pt presents to ED today following fall at home.  Work up for traumatic injury is negative in ED.  Family reports that she has missed her last 2 dialysis sessions and has had increasing confusion.  Family also reported that she typically gets confused just like this when she misses dialysis in past.   Review of Systems: As mentioned in the history of present illness. All other systems reviewed and are negative. Past Medical History:  Diagnosis Date   Anemia    Asthma    Atrial flutter with rapid ventricular response ([place]) 12/25/2021   Chronic kidney disease    dialysis Tues Thurs Sat   Gout    Hypertension    Past Surgical History:  Procedure Laterality Date   AV FISTULA PLACEMENT Left 12/23/2021   Procedure: LEFT ARM BRACHIOBASILIC VEIN ARTERIOVENOUS (AV) FISTULA CREATION;  Surgeon: [name], MD;  Location: [facility];  Service: Vascular;  Laterality: Left;   [place] Left 03/08/2022   Procedure: LEFT SECOND STAGE BASILIC VEIN TRANSPOSITION;  Surgeon: [name], MD;  Location[facility] OR;  Service: Vascular;  Laterality: Left;   BIOPSY  01/19/2022   Procedure: BIOPSY;  Surgeon: Lavena Bullion, DO;  Location: [facility] ENDOSCOPY;  Service: Gastroenterology;;    [place] N/A 06/25/2022   Procedure: OPEN CHOLECYSTECTOMY;  Surgeon: Dwan Bolt, MD;  Location: [facility];  Service: General;  Laterality: N/A;   COLONOSCOPY Left 01/19/2022   Procedure: COLONOSCOPY;  Surgeon: Lavena Bullion, DO;  Location: [facility];  Service: Gastroenterology;  Laterality: Left;   ESOPHAGOGASTRODUODENOSCOPY Left 01/19/2022   Procedure: ESOPHAGOGASTRODUODENOSCOPY (EGD);  Surgeon: Lavena Bullion, DO;  Location[facility]OSCOPY;  Service: Gastroenterology;  Laterality: Left;   INSERTION OF DIALYSIS CATHETER Right 12/23/2021   Procedure: INSERTION OF DIALYSIS CATHETER;  Surgeon: [name], MD;  Location: [facility];  Service: Vascular;  Laterality: Right;   LAPAROSCOPY N/A 06/25/2022   Procedure: STAGING LAPAROSCOPY;  Surgeon: Dwan Bolt, MD;  Location: [facility];  Service: General;  Laterality: N/A;   OPEN PARTIAL HEPATECTOMY  N/A 06/25/2022   Procedure: OPEN PARTIAL HEPATECTOMY;  Surgeon: Dwan Bolt, MD;  Location: [facility];  Service: General;  Laterality: N/A;   POLYPECTOMY  01/19/2022   Procedure: POLYPECTOMY;  Surgeon: Lavena Bullion, DO;  Location: [facility] ENDOSCOPY;  Service: Gastroenterology;;   TEE WITHOUT CARDIOVERSION N/A 12/22/2021   Procedure: TRANSESOPHAGEAL ECHOCARDIOGRAM (TEE);  Surgeon: Jerline Pain, MD;  Location: [facility] ENDOSCOPY;  Service: Cardiovascular;  Laterality: N/A;   TEE WITHOUT CARDIOVERSION N/A 06/17/2022   Procedure: TRANSESOPHAGEAL ECHOCARDIOGRAM (TEE);  Surgeon: Buford Dresser, MD;  Location: [facility] [place];  Service: Cardiovascular;  Laterality: N/A;   ULTRASOUND GUIDANCE FOR VASCULAR  ACCESS  12/23/2021   Procedure: ULTRASOUND GUIDANCE FOR VASCULAR ACCESS;  Surgeon: [name], MD;  Location: [place];  Service: Vascular;;   Social History:  reports that she has never smoked. She has never been exposed to tobacco smoke. She has never used smokeless tobacco. She reports that she does not drink alcohol and  does not use drugs.  Allergies  Allergen Reactions   Shellfish Allergy Other (See Comments)    Unknown reaction    Other Rash    Pt states anesthesia makes her brake out in rash    Family History  Problem Relation Age of Onset   Hypertension Mother    Hypertension Father    Colon cancer Brother    Lung cancer Brother     Prior to Admission medications   Medication Sig Start Date End Date Taking? Authorizing Provider  acetaminophen (TYLENOL) 325 MG tablet Take 2 tablets (650 mg total) by mouth 4 (four) times daily. Patient taking differently: Take 650 mg by mouth every 6 (six) hours as needed for moderate pain. 06/30/22  Yes Elgergawy, Silver Huguenin, MD  albuterol (VENTOLIN HFA) 108 (90 Base) MCG/ACT inhaler Inhale 2 puffs into the lungs every 4 (four) hours as needed for wheezing or shortness of breath. 10/08/19  Yes [provider]  apixaban (ELIQUIS) 2.5 MG TABS tablet Take 1 tablet (2.5 mg total) by mouth 2 (two) times daily. 02/06/22  Yes Ghimire, Henreitta Leber, MD  fluticasone-salmeterol (ADVAIR) 250-50 MCG/ACT AEPB Inhale 1 puff into the lungs daily as needed (for shortness of breath). 02/19/22  Yes [provider]  gabapentin (NEURONTIN) 100 MG capsule Take 1 capsule (100 mg total) by mouth at bedtime. 09/15/22 10/15/22 Yes Vu, Rockey Situ, MD  hydrALAZINE (APRESOLINE) 100 MG tablet Take 100 mg by mouth See admin instructions. Take 1 tablet by mouth twice a day on dialysis days, then take 1 tablet three times a day on non dialysis days 09/14/22  Yes [provider]  metoprolol tartrate (LOPRESSOR) 50 MG tablet Take 1 tablet (50 mg total) by mouth 2 (two) times daily. 12/30/21  Yes Ghimire, Henreitta Leber, MD  pantoprazole (PROTONIX) 40 MG tablet Take 1 tablet (40 mg total) by mouth 2 (two) times daily. 01/22/22  Yes Thurnell Lose, MD  sevelamer carbonate (RENVELA) 800 MG tablet Take 2 tablets (1,600 mg total) by mouth 3 (three) times daily with meals. 12/30/21  Yes Jonetta Osgood, MD    Physical Exam: Vitals:   09/17/22 0115 09/17/22 0133 09/17/22 0230 09/17/22 0300  BP: (!) 209/118 (!) 209/118 (!) 204/121 (!) 155/104  Pulse:  96 100 92  Resp: (!) 8 (!) 21 17 (!) 21  Temp:      TempSrc:      SpO2:  100% 97% 96%  Weight:      Height:       Constitutional: NAD, calm, comfortable Eyes: PERRL, lids and conjunctivae normal ENMT: Mucous membranes are moist. Posterior pharynx clear of any exudate or lesions.Normal dentition.  Neck: normal, supple, no masses, no thyromegaly Respiratory: clear to auscultation bilaterally, no wheezing, no crackles. Normal respiratory effort. No accessory muscle use.  Cardiovascular: Regular rate and rhythm, no murmurs / rubs / gallops. No extremity edema. 2+ pedal pulses. No carotid bruits.  Abdomen: no tenderness, no masses palpated. No hepatosplenomegaly. Bowel sounds positive.  Musculoskeletal: no clubbing / cyanosis. No joint deformity upper and lower extremities. Good ROM, no contractures. Normal muscle tone.  Skin: no rashes, lesions, ulcers.  No induration Neurologic: CN 2-12 grossly intact. Sensation intact, DTR normal. Strength 5/5 in all 4.  Psychiatric: Normal judgment and insight. Maybe mild confusion but Alert and oriented x 3.   Data Reviewed:       Latest Ref Rng & Units 09/16/2022   10:48 PM 09/16/2022   10:42 PM 09/15/2022    4:49 AM  CBC  WBC 4.0 - 10.5 K/uL  5.5  4.7   Hemoglobin 12.0 - 15.0 g/dL 11.2  10.0  11.1   Hematocrit 36.0 - 46.0 % 33.0  31.6  34.3   Platelets 150 - 400 K/uL  100  129    CMP     Component Value Date/Time   NA 136 09/16/2022 2248   K 4.3 09/16/2022 2248   CL 100 09/16/2022 2248   CO2 25 09/16/2022 2242   GLUCOSE 88 09/16/2022 2248   BUN 36 (H) 09/16/2022 2248   CREATININE 7.30 (H) 09/16/2022 2248   CREATININE 5.28 (H) 09/15/2022 0449   CALCIUM 10.2 09/16/2022 2242   PROT 6.5 09/16/2022 2242   ALBUMIN 2.9 (L) 09/16/2022 2242   AST 14 (L) 09/16/2022 2242   ALT 10  09/16/2022 2242   ALKPHOS 40 09/16/2022 2242   BILITOT 0.7 09/16/2022 2242   GFRNONAA 6 (L) 09/16/2022 2242   GFRAA 14 (L) 11/12/2019 0344   CT head and neck: IMPRESSION: 1.  No acute intracranial abnormality. 2. No acute displaced fracture or traumatic listhesis of the cervical spine in a patient with chronic severe C5-C6 degenerative changes. 3. Empty sella. Findings is often a normal anatomic variant but can be associated with idiopathic intracranial hypertension (pseudotumor cerebri). 4. A 1.5 cm calcified hypodensity within the left thyroid gland. Recommend thyroid US (ref: J Am Coll Radiol. 2015 Feb;12(2): 143-50).  CT L spine: IMPRESSION: 1. No acute compression fracture. 2. Unchanged nonacute L3 compression fracture with an osteonecrotic cleft beneath the superior endplate 3. Unchanged severe spinal canal stenosis at L3-4 and moderate spinal canal stenosis at L2-3.   Aortic Atherosclerosis (ICD10-I70.0).    Assessment and Plan: * Acute metabolic encephalopathy DDx includes HTN encephalopathy, build up of gabapentin for back pain (though on very low dose of this), etc From family report to EMS she "gets like this when she misses dialysis" and she missed 2 sessions. Ammonia nl, BUN only 36 Try to control BP as below. Needs dialysis in AM.  Hypertensive urgency Got '10mg'$  labetalol x2 thus far in ED. Next will be Hydralazine '20mg'$  if labetalol ineffective. Cont home PO hydralazine, lopressor. Tele monitor Admit to progressive unit Needs dialysis in AM.  ESRD on hemodialysis ([place]) 2 missed dialysis sessions per family report to EMS. Call nephrology in AM for HD.  Empty sella ([place]) Seen on CT today. However mild confusion / encephalopathy is not usually the typical presentation of IIH (usually headache and visual symptoms).  Pt denies headache, denies any recent visual changes.  Only ongoing complaint really is her low back pain (from prior MRSA osteomyelitis and  diskitis of spine as well as chronic compression fx).  Thyroid nodule Will order the recommended ultrasound.  Paroxysmal atrial flutter (HCC) Cont home metoprolol Cont home eliquis  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  Pt finished 4 weeks IV ABx (I assume vanc), followed by 4 more weeks of bactrim for diskitis. 8/10 back pain since then. But not clear that MRSA bacteremia / infection playing a significant role in todays presentation: no SIRS, pt very hypertensive, etc.  Advance Care Planning:   Code Status: Full Code  Consults: None  Family Communication: No family in room  Severity of Illness: The appropriate patient status for this patient is OBSERVATION. Observation status is judged to be reasonable and necessary in order to provide the required intensity of service to ensure the patient's safety. The patient's presenting symptoms, physical exam findings, and initial radiographic and laboratory data in the context of their medical condition is felt to place them at decreased risk for further clinical deterioration. Furthermore, it is anticipated that the patient will be medically stable for discharge from the hospital within 2 midnights of admission.   Author: Etta Quill., DO 09/17/2022 3:13 AM  For on call review [email or web address].

## 2022-09-17 NOTE — Consult Note (Signed)
Renal Service Consult Note [facility]  Diamond [name] 09/17/2022 [name], MD Requesting Physician: Dr. Horris Latino  Reason for Consult: ESRD pt sp fall at home HPI: The patient is a 71 y.o. year-old w/ hx of anemia, esrd on HD, gout , HTN who presented to ED from home after a mechanical fall w/ associated AMS. Pt seen in ED and w/u started. We are asked to see for ESRD.   Pt seen in ED. She is confused but engaging. No c/o's at this time.   H/o MRSA bacteremia w/ diskitis/ osteo and endocarditis rx'd w/ 4 wks IV abx thru 8/8. Readmitted 8/22 for back pain and MRI suggested poss infection so she rec'd another 4 wks abx w/ po bactrim.    ROS - denies CP, no joint pain, no HA, no blurry vision, no rash, no diarrhea, no nausea/ vomiting, no dysuria, no difficulty voiding   Past Medical History  Past Medical History:  Diagnosis Date   Anemia    Asthma    Atrial flutter with rapid ventricular response ([place]) 12/25/2021   Chronic kidney disease    dialysis Tues Thurs Sat   Gout    Hypertension    Past Surgical History  Past Surgical History:  Procedure Laterality Date   AV FISTULA PLACEMENT Left 12/23/2021   Procedure: LEFT ARM BRACHIOBASILIC VEIN ARTERIOVENOUS (AV) FISTULA CREATION;  Surgeon: [name], MD;  Location: [place];  Service: Vascular;  Laterality: Left;   [place] Left 03/08/2022   Procedure: LEFT SECOND STAGE BASILIC VEIN TRANSPOSITION;  Surgeon: [name], MD;  Location: [place] OR;  Service: Vascular;  Laterality: Left;   BIOPSY  01/19/2022   Procedure: BIOPSY;  Surgeon: Lavena Bullion, DO;  Location: [place] ENDOSCOPY;  Service: Gastroenterology;;   [place] N/A 06/25/2022   Procedure: OPEN CHOLECYSTECTOMY;  Surgeon: Dwan Bolt, MD;  Location: [place];  Service: General;  Laterality: N/A;   COLONOSCOPY Left 01/19/2022   Procedure: COLONOSCOPY;  Surgeon: Lavena Bullion, DO;  Location: [place];  Service: Gastroenterology;  Laterality: Left;   ESOPHAGOGASTRODUODENOSCOPY Left 01/19/2022   Procedure: ESOPHAGOGASTRODUODENOSCOPY (EGD);  Surgeon: Lavena Bullion, DO;  Location: [facility] ENDOSCOPY;  Service: Gastroenterology;  Laterality: Left;   INSERTION OF DIALYSIS CATHETER Right 12/23/2021   Procedure: INSERTION OF DIALYSIS CATHETER;  Surgeon: [name], MD;  Location: [place];  Service: Vascular;  Laterality: Right;   LAPAROSCOPY N/A 06/25/2022   Procedure: STAGING LAPAROSCOPY;  Surgeon: Dwan Bolt, MD;  Location: [place];  Service: General;  Laterality: N/A;   OPEN PARTIAL HEPATECTOMY  N/A 06/25/2022   Procedure: OPEN PARTIAL HEPATECTOMY;  Surgeon: Dwan Bolt, MD;  Location: [place];  Service: General;  Laterality: N/A;   POLYPECTOMY  01/19/2022   Procedure: POLYPECTOMY;  Surgeon: Lavena Bullion, DO;  Location: [place] ENDOSCOPY;  Service: Gastroenterology;;   TEE WITHOUT CARDIOVERSION N/A 12/22/2021   Procedure: TRANSESOPHAGEAL ECHOCARDIOGRAM (TEE);  Surgeon: Jerline Pain, MD;  Location: [facility] ENDOSCOPY;  Service: Cardiovascular;  Laterality: N/A;   TEE WITHOUT CARDIOVERSION N/A 06/17/2022   Procedure: TRANSESOPHAGEAL ECHOCARDIOGRAM (TEE);  Surgeon: Buford Dresser, MD;  Location: [facility] ENDOSCOPY;  Service: Cardiovascular;  Laterality: N/A;   ULTRASOUND GUIDANCE FOR VASCULAR ACCESS  12/23/2021   Procedure: ULTRASOUND GUIDANCE FOR VASCULAR ACCESS;  Surgeon: [name], MD;  Location: [facility] OR;  Service: Vascular;;   Family History  Family History  Problem Relation Age of Onset  Hypertension Mother    Hypertension Father    Colon cancer Brother    Lung cancer Brother    Social History  reports that she has never smoked. She has never been exposed to tobacco smoke. She has never used smokeless tobacco. She reports that she does not drink alcohol and does not use drugs. Allergies  Allergies  Allergen Reactions   Shellfish Allergy Other (See Comments)    Unknown  reaction    Other Rash    Pt states anesthesia makes her brake out in rash   Home medications Prior to Admission medications   Medication Sig Start Date End Date Taking? Authorizing Provider  acetaminophen (TYLENOL) 325 MG tablet Take 2 tablets (650 mg total) by mouth 4 (four) times daily. Patient taking differently: Take 650 mg by mouth every 6 (six) hours as needed for moderate pain. 06/30/22  Yes Elgergawy, Silver Huguenin, MD  albuterol (VENTOLIN HFA) 108 (90 Base) MCG/ACT inhaler Inhale 2 puffs into the lungs every 4 (four) hours as needed for wheezing or shortness of breath. 10/08/19  Yes [provider]  apixaban (ELIQUIS) 2.5 MG TABS tablet Take 1 tablet (2.5 mg total) by mouth 2 (two) times daily. 02/06/22  Yes Ghimire, Henreitta Leber, MD  fluticasone-salmeterol (ADVAIR) 250-50 MCG/ACT AEPB Inhale 1 puff into the lungs daily as needed (for shortness of breath). 02/19/22  Yes [provider]  gabapentin (NEURONTIN) 100 MG capsule Take 1 capsule (100 mg total) by mouth at bedtime. 09/15/22 10/15/22 Yes Vu, Rockey Situ, MD  hydrALAZINE (APRESOLINE) 100 MG tablet Take 100 mg by mouth See admin instructions. Take 1 tablet by mouth twice a day on dialysis days, then take 1 tablet three times a day on non dialysis days 09/14/22  Yes [provider]  metoprolol tartrate (LOPRESSOR) 50 MG tablet Take 1 tablet (50 mg total) by mouth 2 (two) times daily. 12/30/21  Yes Ghimire, Henreitta Leber, MD  pantoprazole (PROTONIX) 40 MG tablet Take 1 tablet (40 mg total) by mouth 2 (two) times daily. 01/22/22  Yes Thurnell Lose, MD  sevelamer carbonate (RENVELA) 800 MG tablet Take 2 tablets (1,600 mg total) by mouth 3 (three) times daily with meals. 12/30/21  Yes Ghimire, Henreitta Leber, MD     Vitals:   09/17/22 0515 09/17/22 0600 09/17/22 0751 09/17/22 1021  BP: (!) 171/102 (!) 196/102 (!) 163/94 (!) 173/100  Pulse: 86 87 95   Resp: 17 (!) 21 (!) 22   Temp:   (!) 101 F (38.3 C)   TempSrc:   Oral    SpO2: 97% 98% 99%   Weight:      Height:       Exam Gen awake, a bit disheveled, pleasant, confused Poor skin turgor Sclera anicteric, throat clear and a bit dry No jvd or bruits Chest clear bilat to bases, no rales/ wheezing RRR no RG Abd soft ntnd no mass or ascites +bs GU defer MS no joint effusions or deformity Ext no LE or UE edema, no wounds or ulcers Neuro is alert, Ox 3 , nf    LUA AVF+ bruit   Home meds include - albuterol, apixaban, advair, hydralazine 100 2-3 x per day, metoprolol 50 bid, pantoprazole, sevelamer carb 2 ac tid, prns/ vits/ supps   OP HD: [place] TTS 4h   400/600  53.5kg  2/2 bath  Hep none LUA AVF - last HD 10/10 post wt 54.1kg - last Hb 10.1 - mircera 100 q2, new start  - iron  sucrose 50 mg weekly - doxercalciferol 3 ug iv tts     109/100, HR 90,  RR 15  97%    K+ 5.4  BUN 42  cr 7.53  Assessment/ Plan: Acute metabolic encephalopathy - d/t high BP, gabapentin, missed HD. Had not really missed any HD sessions, she got her HD x 3 last week and this week had HD on Tuesday, only session missed w/ yesterday 10/12. No sig azotemia.  ESRD - on HD TTS. Good compliance. HD today and again tomorrow to get back on schedule.  HTN /vol - looks a bit dry, keep even on HD. Get wts.  Anemia esrd - Hb 10-11 here. If hb drops will start esa which was the plan at OP HD. Fe IV weekly.  MBD ckd - CCa a bit high, will hold IV vdra. Cont renvela as binder.    Rob Cleotha Whalin  MD 09/17/2022, 10:26 AM Recent Labs  Lab 09/16/22 2242 09/16/22 2248 09/17/22 0402  HGB 10.0* 11.2* 9.5*  ALBUMIN 2.9*  --   --   CALCIUM 10.2  --  10.0  CREATININE 7.28* 7.30* 7.53*  K 4.3 4.3 5.4*   Inpatient medications:  apixaban  2.5 mg Oral BID   [START ON 09/18/2022] hydrALAZINE  100 mg Oral 2 times per day on Tue Thu Sat   hydrALAZINE  100 mg Oral 3 times per day on Sun Mon Wed Fri   metoprolol tartrate  50 mg Oral BID   mometasone-formoterol  2 puff Inhalation BID    pantoprazole  40 mg Oral BID   sevelamer carbonate  1,600 mg Oral TID WC   vancomycin variable dose per unstable renal function (pharmacist dosing)   Does not apply See admin instructions    ceFEPime (MAXIPIME) IV     vancomycin     acetaminophen **OR** acetaminophen, albuterol, labetalol, ondansetron **OR** ondansetron (ZOFRAN) IV

## 2022-09-17 NOTE — Progress Notes (Signed)
  X-cover Note: Called by Therapist, sports. Pt with rapid afib after returning from HD session. Hx of aflutter. On eliquis  Will start cardizem gtts.     Kristopher Oppenheim, DO [facility]

## 2022-09-17 NOTE — Assessment & Plan Note (Signed)
2 missed dialysis sessions per family report to EMS. Call nephrology in AM for HD.

## 2022-09-17 NOTE — Progress Notes (Signed)
PROGRESS NOTE  Diamond [name] XQJ:[id number] DOB: 18-May-1951 DOA: 09/16/2022 PCP: [name], MD   HPI/Recap of past 24 hours: Diamond [name] is a 71 y.o. female with medical history significant of ESRD on HD, PAF on eliquis, HTN, h/o recurrent MRSA bacteremia and vertebral discitis, presents the ED following a fall at home and some acute on chronic back pain.  Patient also noted to be intermittently confused, reported missing a dialysis session due to back pain as per patient.  In the ED, work-up for any traumatic injury was negative including CT head/spine.  While in the ED, patient noted to spike a temp, blood cultures were taken and patient was started on IV vancomycin and cefepime.  Of note, patient recently finished 4 weeks of IV ABx (looks like vanc) 8/8.  Readmitted 8/22 for acute on chronic back pain with MRI concerning for l2-3 paravertebral edema and still elevated crp.  Treated with another 4 weeks of Bactrim PO.  Due to history, ID was consulted, as well as nephrology for HD.  Patient admitted for further management.     Today, patient still reporting back pain, appears dry.  Patient was alert, awake, oriented x3.  Patient denies any chest pain, abdominal pain, nausea/vomiting.      Assessment/Plan: Principal Problem:   Acute metabolic encephalopathy Active Problems:   Hypertensive urgency   ESRD on hemodialysis (HCC)   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    Paroxysmal atrial flutter (HCC)   Thyroid nodule   Empty sella (HCC)   Acute on chronic back pain History of recurrent MRSA bacteremia and vertebral discitis Had a temp spike, with no leukocytosis BC x2 pending Procalcitonin 0.62, CRP 19, will trend UA negative Chest x-ray unremarkable CT lumbar spine without contrast unremarkable for any fracture or collection ID consulted due to history, recommend MRI lumbar spine for further evaluation Continue vancomycin for now, DC  cefepime Monitor closely PT/OT, fall precautions  Hypertensive urgency Cont home PO hydralazine, lopressor Hopefully will improve with HD  Acute metabolic encephalopathy Improving May be due to HTN encephalopathy vs infection Management as above   ESRD on hemodialysis ([facility]) 1 missed dialysis session Nephrology on board, plan for HD on 10/13 and 10/14 to get back on schedule  Paroxysmal atrial flutter (HCC) Heart rate well controlled Cont home metoprolol Cont home eliquis   History of MRSA bacteremia and discitis  Pt finished 4 weeks IV Abx, followed by 4 more weeks of bactrim for discitis Followed by ID as an outpatient   Empty sella ([place]) Seen on CT Pt denies headache, recent visual changes Monitor   Thyroid nodule Thyroid ultrasound showed normal-sized thyroid with bilateral nodules, does not meet any criteria for biopsy         Estimated body mass index is 17.79 kg/m as calculated from the following:   Height as of this encounter: '5\' 6"'$  (1.676 m).   Weight as of this encounter: 50 kg.     Code Status: Full  Family Communication: None at bedside  Disposition Plan: Status is: Inpatient Remains inpatient appropriate because: Level of care      Consultants: Nephrology ID  Procedures: None  Antimicrobials: Vancomycin  DVT prophylaxis: Eliquis   Objective: Vitals:   09/17/22 1021 09/17/22 1116 09/17/22 1415 09/17/22 1500  BP: (!) 173/100 (!) 158/94 (!) 187/112 (!) 198/134  Pulse:  82 79 82  Resp:  (!) '21 16 17  '$ Temp:  98.4 F (36.9 C)  TempSrc:  Oral    SpO2:  97% 98% 98%  Weight:      Height:        Intake/Output Summary (Last 24 hours) at 09/17/2022 1517 Last data filed at 09/17/2022 1302 Gross per 24 hour  Intake 350.21 ml  Output --  Net 350.21 ml   Filed Weights   09/16/22 2228  Weight: 50 kg    Exam: General: NAD, appears dry, lethargic, cachectic, awake/alert/oriented Cardiovascular: S1, S2 present Respiratory:  CTAB Abdomen: Soft, nontender, nondistended, bowel sounds present Musculoskeletal: No bilateral pedal edema noted Skin: Dry Psychiatry: Normal mood     Data Reviewed: CBC: Recent Labs  Lab 09/15/22 0449 09/16/22 2242 09/16/22 2248 09/17/22 0402  WBC 4.7 5.5  --  6.5  NEUTROABS 3,201  --   --   --   HGB 11.1* 10.0* 11.2* 9.5*  HCT 34.3* 31.6* 33.0* 29.4*  MCV 102.4* 104.6*  --  102.1*  PLT 129* 100*  --  99*   Basic Metabolic Panel: Recent Labs  Lab 09/15/22 0449 09/16/22 2242 09/16/22 2248 09/17/22 0402  NA 139 138 136 137  K 4.4 4.3 4.3 5.4*  CL 97* 99 100 100  CO2 30 25  --  26  GLUCOSE 102* 96 88 90  BUN 31* 39* 36* 42*  CREATININE 5.28* 7.28* 7.30* 7.53*  CALCIUM 10.2 10.2  --  10.0   GFR: Estimated Creatinine Clearance: 5.3 mL/min (A) (by C-G formula based on SCr of 7.53 mg/dL (H)). Liver Function Tests: Recent Labs  Lab 09/15/22 0449 09/16/22 2242  AST 14 14*  ALT 8 10  ALKPHOS  --  40  BILITOT 0.5 0.7  PROT 7.0 6.5  ALBUMIN  --  2.9*   No results for input(s): "LIPASE", "AMYLASE" in the last 168 hours. Recent Labs  Lab 09/17/22 0134  AMMONIA 24   Coagulation Profile: Recent Labs  Lab 09/16/22 2242  INR 1.1   Cardiac Enzymes: No results for input(s): "CKTOTAL", "CKMB", "CKMBINDEX", "TROPONINI" in the last 168 hours. BNP (last 3 results) No results for input(s): "PROBNP" in the last 8760 hours. HbA1C: No results for input(s): "HGBA1C" in the last 72 hours. CBG: No results for input(s): "GLUCAP" in the last 168 hours. Lipid Profile: No results for input(s): "CHOL", "HDL", "LDLCALC", "TRIG", "CHOLHDL", "LDLDIRECT" in the last 72 hours. Thyroid Function Tests: No results for input(s): "TSH", "T4TOTAL", "FREET4", "T3FREE", "THYROIDAB" in the last 72 hours. Anemia Panel: No results for input(s): "VITAMINB12", "FOLATE", "FERRITIN", "TIBC", "IRON", "RETICCTPCT" in the last 72 hours. Urine analysis:    Component Value Date/Time    COLORURINE YELLOW 09/17/2022 0137   APPEARANCEUR CLOUDY (A) 09/17/2022 0137   LABSPEC 1.008 09/17/2022 0137   PHURINE 9.0 (H) 09/17/2022 0137   GLUCOSEU NEGATIVE 09/17/2022 0137   HGBUR NEGATIVE 09/17/2022 0137   BILIRUBINUR NEGATIVE 09/17/2022 0137   KETONESUR NEGATIVE 09/17/2022 0137   PROTEINUR 100 (A) 09/17/2022 0137   UROBILINOGEN 0.2 09/27/2021 1329   NITRITE NEGATIVE 09/17/2022 0137   LEUKOCYTESUR NEGATIVE 09/17/2022 0137   Sepsis Labs: '@LABRCNTIP'$ (procalcitonin:4,lacticidven:4)  ) Recent Results (from the past 240 hour(s))  Culture, blood (Routine X 2) w Reflex to ID Panel     Status: None (Preliminary result)   Collection Time: 09/17/22  7:58 AM   Specimen: BLOOD RIGHT ARM  Result Value Ref Range Status   Specimen Description BLOOD RIGHT ARM  Final   Special Requests   Final    BOTTLES DRAWN AEROBIC AND ANAEROBIC Blood Culture adequate  volume   Culture   Final    NO GROWTH < 12 HOURS Performed at [facility], [place] [address], [place], [place] [address]    Report Status PENDING  Incomplete  Culture, blood (Routine X 2) w Reflex to ID Panel     Status: None (Preliminary result)   Collection Time: 09/17/22  8:03 AM   Specimen: BLOOD RIGHT HAND  Result Value Ref Range Status   Specimen Description BLOOD RIGHT HAND  Final   Special Requests   Final    BOTTLES DRAWN AEROBIC ONLY Blood Culture results may not be optimal due to an inadequate volume of blood received in culture bottles   Culture   Final    NO GROWTH < 12 HOURS Performed at [facility], [place] [address] [place], [place] [address]    Report Status PENDING  Incomplete      Studies: US THYROID  Result Date: 09/17/2022 CLINICAL DATA:  Nodule EXAM: THYROID ULTRASOUND TECHNIQUE: Ultrasound examination of the thyroid gland and adjacent soft tissues was performed. COMPARISON:  None Available. FINDINGS: Parenchymal Echotexture: Moderately heterogenous Isthmus: 0.5 cm thickness Right lobe: 3.6 x 1.6 x  1.3 cm Left lobe: 3.5 x 2 x 2 cm _________________________________________________________ Estimated total number of nodules >/= 1 cm: 2 Number of spongiform nodules >/=  2 cm not described below (TR1): 0 Number of mixed cystic and solid nodules >/= 1.5 cm not described below (TR2): 0 _________________________________________________________ Nodule # 1: Location: Right; mid Maximum size: 1.2 cm; Other 2 dimensions: 0.8 x 1.2 cm Composition: solid/almost completely solid (2) Echogenicity: hypoechoic (2) Shape: not taller-than-wide (0) Margins: smooth (0) Echogenic foci: none (0) ACR TI-RADS total points: 4. ACR TI-RADS risk category: TR 4. ACR TI-RADS recommendations: *Given size (>/= 1 - 1.4 cm) and appearance, a follow-up ultrasound in 1 year should be considered based on TI-RADS criteria. _________________________________________________________ Nodule # 2: Location: Left; mid Maximum size: 1.4 cm; Other 2 dimensions: 1.1 x 1.2 cm Composition: solid/almost completely solid (2) Echogenicity: hypoechoic (2) Shape: not taller-than-wide (0) Margins: smooth (0) Echogenic foci: peripheral calcifications (2) ACR TI-RADS total points: 6. ACR TI-RADS risk category: TR 4. ACR TI-RADS recommendations: *Given size (>/= 1 - 1.4 cm) and appearance, a follow-up ultrasound in 1 year should be considered based on TI-RADS criteria. _________________________________________________________ No regional cervical adenopathy. IMPRESSION: 1. Normal-sized thyroid with bilateral nodules. Neither meets criteria for biopsy. Recommend annual/biennial ultrasound follow-up as above, until stability x5 years confirmed. The above is in keeping with the ACR TI-RADS recommendations - J Am Coll Radiol 2017;14:587-595. Electronically Signed   By: Lucrezia Europe M.D.   On: 09/17/2022 12:07   CT Lumbar Spine Wo Contrast  Result Date: 09/17/2022 CLINICAL DATA:  Lumbar compression fracture EXAM: CT LUMBAR SPINE WITHOUT CONTRAST TECHNIQUE: Multidetector  CT imaging of the lumbar spine was performed without intravenous contrast administration. Multiplanar CT image reconstructions were also generated. RADIATION DOSE REDUCTION: This exam was performed according to the departmental dose-optimization program which includes automated exposure control, adjustment of the mA and/or kV according to patient size and/or use of iterative reconstruction technique. COMPARISON:  07/25/2022 FINDINGS: Segmentation: Transitional L5 vertebrae with bilateral assimilation joints. Alignment: Grade 1 anterolisthesis at L3-4 Vertebrae: Diffuse osteopenia. There is no acute compression fracture. There is redemonstration of a nonacute L3 compression fracture with an osteonecrotic cleft beneath the superior endplate which has expanded slightly but is otherwise unchanged. Advanced degenerative disc disease with endplate remodeling at G3-8 is unchanged. Paraspinal and  other soft tissues: Calcific aortic atherosclerosis Disc levels: L1-2: Mild disc bulge.  Without high-grade stenosis L2-3: Unchanged retropulsion of posterosuperior corner with moderate spinal canal stenosis. L3-4: Unchanged severe spinal canal stenosis due to combination of ligamentum flavum bulk and large disc bulge. L4-5: Moderate disc bulge with endplate spurring. Unchanged mild spinal canal stenosis. L5-S1: Transitional level IMPRESSION: 1. No acute compression fracture. 2. Unchanged nonacute L3 compression fracture with an osteonecrotic cleft beneath the superior endplate 3. Unchanged severe spinal canal stenosis at L3-4 and moderate spinal canal stenosis at L2-3. Aortic Atherosclerosis (ICD10-I70.0). Electronically Signed   By: Ulyses Jarred M.D.   On: 09/17/2022 00:04   CT Cervical Spine Wo Contrast  Result Date: 09/16/2022 CLINICAL DATA:  Polytrauma, blunt Level 2 fall EXAM: CT HEAD WITHOUT CONTRAST CT CERVICAL SPINE WITHOUT CONTRAST TECHNIQUE: Multidetector CT imaging of the head and cervical spine was performed  following the standard protocol without intravenous contrast. Multiplanar CT image reconstructions of the cervical spine were also generated. RADIATION DOSE REDUCTION: This exam was performed according to the departmental dose-optimization program which includes automated exposure control, adjustment of the mA and/or kV according to patient size and/or use of iterative reconstruction technique. COMPARISON:  MR thoracic spine 07/26/2022 FINDINGS: CT HEAD FINDINGS BRAIN: BRAIN Cerebral ventricle sizes are concordant with the degree of cerebral volume loss. Patchy and confluent areas of decreased attenuation are noted throughout the deep and periventricular white matter of the cerebral hemispheres bilaterally, compatible with chronic microvascular ischemic disease. No evidence of large-territorial acute infarction. No parenchymal hemorrhage. No mass lesion. No extra-axial collection. No mass effect or midline shift. No hydrocephalus. Basilar cisterns are patent. Empty sella. Vascular: No hyperdense vessel. Atherosclerotic calcifications are present within the cavernous internal carotid arteries. Skull: No acute fracture or focal lesion. Sinuses/Orbits: Paranasal sinuses and mastoid air cells are clear. The orbits are unremarkable. Other: None. CT CERVICAL SPINE FINDINGS Alignment: Similar-appearing retrolisthesis of C5 on C6. Skull base and vertebrae: Similar-appearing severe degenerative changes of the C5-C6 level with anterior wedge deformity of the C5 level. Posterior disc osteophyte complex formation at the C3-C4 and C4-C5 level. No severe osseous neural foraminal or central canal stenosis. No acute fracture. No aggressive appearing focal osseous lesion or focal pathologic process. Soft tissues and spinal canal: No prevertebral fluid or swelling. No visible canal hematoma. Upper chest: Unremarkable. Other: Severe atherosclerotic plaque of the visualized thoracic aorta. A 1.5 cm calcified hypodensity within the left  thyroid gland. IMPRESSION: 1.  No acute intracranial abnormality. 2. No acute displaced fracture or traumatic listhesis of the cervical spine in a patient with chronic severe C5-C6 degenerative changes. 3. Empty sella. Findings is often a normal anatomic variant but can be associated with idiopathic intracranial hypertension (pseudotumor cerebri). 4. A 1.5 cm calcified hypodensity within the left thyroid gland. Recommend thyroid US (ref: J Am Coll Radiol. 2015 Feb;12(2): 143-50). Electronically Signed   By: Iven Finn M.D.   On: 09/16/2022 23:17   CT HEAD WO CONTRAST (5MM)  Result Date: 09/16/2022 CLINICAL DATA:  Polytrauma, blunt Level 2 fall EXAM: CT HEAD WITHOUT CONTRAST CT CERVICAL SPINE WITHOUT CONTRAST TECHNIQUE: Multidetector CT imaging of the head and cervical spine was performed following the standard protocol without intravenous contrast. Multiplanar CT image reconstructions of the cervical spine were also generated. RADIATION DOSE REDUCTION: This exam was performed according to the departmental dose-optimization program which includes automated exposure control, adjustment of the mA and/or kV according to patient size and/or use of iterative reconstruction technique. COMPARISON:  MR thoracic spine 07/26/2022 FINDINGS: CT HEAD FINDINGS BRAIN: BRAIN Cerebral ventricle sizes are concordant with the degree of cerebral volume loss. Patchy and confluent areas of decreased attenuation are noted throughout the deep and periventricular white matter of the cerebral hemispheres bilaterally, compatible with chronic microvascular ischemic disease. No evidence of large-territorial acute infarction. No parenchymal hemorrhage. No mass lesion. No extra-axial collection. No mass effect or midline shift. No hydrocephalus. Basilar cisterns are patent. Empty sella. Vascular: No hyperdense vessel. Atherosclerotic calcifications are present within the cavernous internal carotid arteries. Skull: No acute fracture or  focal lesion. Sinuses/Orbits: Paranasal sinuses and mastoid air cells are clear. The orbits are unremarkable. Other: None. CT CERVICAL SPINE FINDINGS Alignment: Similar-appearing retrolisthesis of C5 on C6. Skull base and vertebrae: Similar-appearing severe degenerative changes of the C5-C6 level with anterior wedge deformity of the C5 level. Posterior disc osteophyte complex formation at the C3-C4 and C4-C5 level. No severe osseous neural foraminal or central canal stenosis. No acute fracture. No aggressive appearing focal osseous lesion or focal pathologic process. Soft tissues and spinal canal: No prevertebral fluid or swelling. No visible canal hematoma. Upper chest: Unremarkable. Other: Severe atherosclerotic plaque of the visualized thoracic aorta. A 1.5 cm calcified hypodensity within the left thyroid gland. IMPRESSION: 1.  No acute intracranial abnormality. 2. No acute displaced fracture or traumatic listhesis of the cervical spine in a patient with chronic severe C5-C6 degenerative changes. 3. Empty sella. Findings is often a normal anatomic variant but can be associated with idiopathic intracranial hypertension (pseudotumor cerebri). 4. A 1.5 cm calcified hypodensity within the left thyroid gland. Recommend thyroid US (ref: J Am Coll Radiol. 2015 Feb;12(2): 143-50). Electronically Signed   By: Iven Finn M.D.   On: 09/16/2022 23:17   DG Pelvis Portable  Result Date: 09/16/2022 CLINICAL DATA:  Level 2 trauma, fall. History of fracture 2 lower back from years ago. EXAM: PORTABLE PELVIS 1-2 VIEWS COMPARISON:  07/07/2022. FINDINGS: Examination is limited due to patient rotation. The sacrum is not well evaluated on exam due to overlying bowel gas. No obvious acute fracture or dislocation. There is stable bony deformity of the inferior pubic ramus on the left, which may be due to old trauma. IMPRESSION: No acute fracture or dislocation. Electronically Signed   By: Brett Fairy M.D.   On: 09/16/2022  22:52   DG Chest Port 1 View  Result Date: 09/16/2022 CLINICAL DATA:  Level 2 trauma, fall. History of fracture and lower back for years ago. EXAM: PORTABLE CHEST 1 VIEW COMPARISON:  None Available. FINDINGS: The heart size and mediastinal contours are within normal limits. There is atherosclerotic calcification of the aorta. No consolidation, effusion, or pneumothorax. No acute osseous abnormality is seen. IMPRESSION: No active disease. Electronically Signed   By: Brett Fairy M.D.   On: 09/16/2022 22:46   DG Wrist Complete Right  Result Date: 09/16/2022 CLINICAL DATA:  Level 2 trauma, fall. EXAM: RIGHT WRIST - COMPLETE 3+ VIEW COMPARISON:  01/16/2021. FINDINGS: There is no evidence of fracture or dislocation. Mild degenerative changes are present at the first carpometacarpal joint and wrist. Vascular calcifications are noted in the soft tissues. There is chondrocalcinosis with calcification at the TFCC. IMPRESSION: No acute fracture or dislocation. Electronically Signed   By: Brett Fairy M.D.   On: 09/16/2022 22:45    Scheduled Meds:  apixaban  2.5 mg Oral BID   Chlorhexidine Gluconate Cloth  6 each Topical Q0600   [START ON 09/18/2022] hydrALAZINE  100 mg Oral 2  times per day on Tue Thu Sat   hydrALAZINE  100 mg Oral 3 times per day on Sun Mon Wed Fri   metoprolol tartrate  50 mg Oral BID   mometasone-formoterol  2 puff Inhalation BID   pantoprazole  40 mg Oral BID   sevelamer carbonate  1,600 mg Oral TID WC   vancomycin variable dose per unstable renal function (pharmacist dosing)   Does not apply See admin instructions    Continuous Infusions:  [START ON 09/18/2022] vancomycin       LOS: 0 days     Alma Friendly, MD [facility]  If 7PM-7AM, please contact night-coverage [email or web address] 09/17/2022, 3:17 PM

## 2022-09-17 NOTE — ED Notes (Signed)
EMS placed 20g IV in [facility] of restricted LUE. IV removed. No indication of complication at this time.

## 2022-09-17 NOTE — ED Notes (Signed)
Called son Marchia Bond per patient request [phone] no response. Message left.

## 2022-09-17 NOTE — Assessment & Plan Note (Addendum)
Seen on CT today. However mild confusion / encephalopathy is not usually the typical presentation of IIH (usually headache and visual symptoms).  Pt denies headache, denies any recent visual changes.  Only ongoing complaint really is her low back pain (from prior MRSA osteomyelitis and diskitis of spine as well as chronic compression fx).

## 2022-09-17 NOTE — Assessment & Plan Note (Addendum)
Pt finished 4 weeks IV ABx (I assume vanc), followed by 4 more weeks of bactrim for diskitis. 8/10 back pain since then. But not clear that MRSA bacteremia / infection playing a significant role in todays presentation: no SIRS, pt very hypertensive, etc.

## 2022-09-17 NOTE — Assessment & Plan Note (Addendum)
Got '10mg'$  labetalol x2 thus far in ED. Next will be Hydralazine '20mg'$  if labetalol ineffective. Cont home PO hydralazine, lopressor. Tele monitor Admit to progressive unit Needs dialysis in AM.

## 2022-09-17 NOTE — Assessment & Plan Note (Addendum)
DDx includes HTN encephalopathy, build up of gabapentin for back pain (though on very low dose of this), etc From family report to EMS she "gets like this when she misses dialysis" and she missed 2 sessions. 1. Ammonia nl, BUN only 36 2. Try to control BP as below. 3. Needs dialysis in AM.

## 2022-09-17 NOTE — Assessment & Plan Note (Signed)
Will order the recommended ultrasound.

## 2022-09-18 DIAGNOSIS — G9341 Metabolic encephalopathy: Secondary | ICD-10-CM | POA: Diagnosis not present

## 2022-09-18 DIAGNOSIS — N186 End stage renal disease: Secondary | ICD-10-CM | POA: Diagnosis not present

## 2022-09-18 DIAGNOSIS — I160 Hypertensive urgency: Secondary | ICD-10-CM | POA: Diagnosis not present

## 2022-09-18 DIAGNOSIS — I4892 Unspecified atrial flutter: Secondary | ICD-10-CM | POA: Diagnosis not present

## 2022-09-18 LAB — CBC WITH DIFFERENTIAL/PLATELET
Abs Immature Granulocytes: 0.03 10*3/uL (ref 0.00–0.07)
Basophils Absolute: 0 10*3/uL (ref 0.0–0.1)
Basophils Relative: 0 %
Eosinophils Absolute: 0.1 10*3/uL (ref 0.0–0.5)
Eosinophils Relative: 2 %
HCT: 31.4 % — ABNORMAL LOW (ref 36.0–46.0)
Hemoglobin: 10.7 g/dL — ABNORMAL LOW (ref 12.0–15.0)
Immature Granulocytes: 1 %
Lymphocytes Relative: 13 %
Lymphs Abs: 0.7 10*3/uL (ref 0.7–4.0)
MCH: 33.8 pg (ref 26.0–34.0)
MCHC: 34.1 g/dL (ref 30.0–36.0)
MCV: 99.1 fL (ref 80.0–100.0)
Monocytes Absolute: 0.6 10*3/uL (ref 0.1–1.0)
Monocytes Relative: 12 %
Neutro Abs: 4 10*3/uL (ref 1.7–7.7)
Neutrophils Relative %: 72 %
Platelets: 109 10*3/uL — ABNORMAL LOW (ref 150–400)
RBC: 3.17 MIL/uL — ABNORMAL LOW (ref 3.87–5.11)
RDW: 18.4 % — ABNORMAL HIGH (ref 11.5–15.5)
WBC: 5.5 10*3/uL (ref 4.0–10.5)
nRBC: 0 % (ref 0.0–0.2)

## 2022-09-18 LAB — PROCALCITONIN: Procalcitonin: 0.8 ng/mL

## 2022-09-18 LAB — HEPATITIS B SURFACE ANTIGEN: Hepatitis B Surface Ag: NONREACTIVE

## 2022-09-18 LAB — BLOOD CULTURE ID PANEL (REFLEXED) - BCID2

## 2022-09-18 LAB — C-REACTIVE PROTEIN: CRP: 22.3 mg/dL — ABNORMAL HIGH (ref <1.0)

## 2022-09-18 LAB — RENAL FUNCTION PANEL
Albumin: 2.9 g/dL — ABNORMAL LOW (ref 3.5–5.0)
Anion gap: 14 (ref 5–15)
BUN: 23 mg/dL (ref 8–23)
CO2: 27 mmol/L (ref 22–32)
Calcium: 9.8 mg/dL (ref 8.9–10.3)
Chloride: 95 mmol/L — ABNORMAL LOW (ref 98–111)
Creatinine, Ser: 4.72 mg/dL — ABNORMAL HIGH (ref 0.44–1.00)
GFR, Estimated: 9 mL/min — ABNORMAL LOW (ref >60)
Glucose, Bld: 81 mg/dL (ref 70–99)
Phosphorus: 4.6 mg/dL (ref 2.5–4.6)
Potassium: 3.8 mmol/L (ref 3.5–5.1)
Sodium: 136 mmol/L (ref 135–145)

## 2022-09-18 LAB — HEPATITIS B SURFACE ANTIBODY,QUALITATIVE: Hep B S Ab: NONREACTIVE

## 2022-09-18 LAB — HEPATITIS B CORE ANTIBODY, TOTAL: Hep B Core Total Ab: REACTIVE — AB

## 2022-09-18 LAB — HEPATITIS C ANTIBODY: HCV Ab: REACTIVE — AB

## 2022-09-18 MED ORDER — OXYCODONE-ACETAMINOPHEN 5-325 MG PO TABS
ORAL_TABLET | ORAL | Status: AC
  Filled 2022-09-18: qty 1

## 2022-09-18 NOTE — Progress Notes (Signed)
Subjective: Slight pleasantly confused but aware she is at [facility], today is Saturday, recognizes me from outpatient HD, unaware why she is here.  Reeducated her /for HD today on schedule  Objective Vital signs in last 24 hours: Vitals:   09/18/22 0545 09/18/22 0600 09/18/22 0615 09/18/22 0700  BP: (!) 177/113  (!) 139/110 (!) 171/101  Pulse:    84  Resp: (!) '21 20 16 18  '$ Temp:    98 F (36.7 C)  TempSrc:    Oral  SpO2:  99%  100%  Weight:      Height:       Weight change:   Physical Exam: General: Thin elderly female NAD pleasant confusion Heart: RRR, no MRG Lungs: Clear anteriorly nonlabored breathing Abdomen: NABS soft, NTND no ascites Extremities: No pedal edema Dialysis Access: L UA  AVF + bruit   Home meds include - albuterol, apixaban, advair, hydralazine 100 2-3 x per day, metoprolol 50 bid, pantoprazole, sevelamer carb 2 ac tid, prns/ vits/ supps    OP HD: [place] TTS 4h   400/600  53.5kg  2/2 bath  Hep none LUA AVF - last HD 10/10 post wt 54.1kg - last Hb 10.1 - mircera 100 q2, new start  - iron sucrose 50 mg weekly - doxercalciferol 3 ug iv tts      109/100, HR 90,  RR 15  97%    K+ 5.4  BUN 42  cr 7.53   Problem/Plan Acute metabolic encephalopathy -multiple etiology MRSA bacteremia with vertebral discitis/osteomyelitis, dementia, atrial flutter, hypertension , gabapentin, missed HD. Had not really missed any HD sessions, she got her HD x 3 last week and this week had HD on Tuesday, only session missed w/ yesterday 10/12. No sig azotemia.  This a.m. pleasant confusion appears better ESRD - on HD TTS.  Usually good compliance.  Missed on Thursday had hemodialysis yesterday =Friday HD and again today to get back on schedule.  HTN /vol - looks a bit dry, keep even on HD. Get wts. BP meds, requiring IV Cardizem in setting of acute A-fib yesterday  Progression of discitis/osteomyelitis L-spine on MRI on 09/17/2022/= ID consulting noted [facility] 10/13 1 of 4 bottles GPC, MRSA  per [facility] ID on vancomycin, TTE ordered by ID and neurosurgery to eval repeat blood cultures 10/15 ordered Rapid A-fib IV Cardizem drip started by admit team yesterday evening Anemia esrd - Hb 10 10.7 here. If hb drops will start esa which was the plan at OP HD. Fe IV weekly will hold in setting of infection MBD ckd - CCa a bit high, will hold IV vdra. Cont renvela as binder.  Phos 4.6   :  Ernest Haber, PA-C [facility] Bee[facility] [phone] 09/18/2022,11:30 AM  LOS: 1 day   Labs: Basic Metabolic Panel: Recent Labs  Lab 09/16/22 2242 09/16/22 2248 09/17/22 0402 09/18/22 0721  NA 138 136 137 136  K 4.3 4.3 5.4* 3.8  CL 99 100 100 95*  CO2 25  --  26 27  GLUCOSE 96 88 90 81  BUN 39* 36* 42* 23  CREATININE 7.28* 7.30* 7.53* 4.72*  CALCIUM 10.2  --  10.0 9.8  PHOS  --   --   --  4.6   Liver Function Tests: Recent Labs  Lab 09/15/22 0449 09/16/22 2242 09/18/22 0721  AST 14 14*  --   ALT 8 10  --   ALKPHOS  --  40  --   BILITOT 0.5 0.7  --  PROT 7.0 6.5  --   ALBUMIN  --  2.9* 2.9*   No results for input(s): "LIPASE", "AMYLASE" in the last 168 hours. Recent Labs  Lab 09/17/22 0134  AMMONIA 24   CBC: Recent Labs  Lab 09/15/22 0449 09/16/22 2242 09/16/22 2248 09/17/22 0402 09/18/22 0546  WBC 4.7 5.5  --  6.5 5.5  NEUTROABS 3,201  --   --   --  4.0  HGB 11.1* 10.0* 11.2* 9.5* 10.7*  HCT 34.3* 31.6* 33.0* 29.4* 31.4*  MCV 102.4* 104.6*  --  102.1* 99.1  PLT 129* 100*  --  99* 109*   Cardiac Enzymes: No results for input(s): "CKTOTAL", "CKMB", "CKMBINDEX", "TROPONINI" in the last 168 hours. CBG: No results for input(s): "GLUCAP" in the last 168 hours.  Studies/Results: MR LUMBAR SPINE WO CONTRAST  Result Date: 09/17/2022 CLINICAL DATA:  Epidural abscess suspect EXAM: MRI LUMBAR SPINE WITHOUT CONTRAST TECHNIQUE: Multiplanar, multisequence MR imaging of the lumbar spine was performed. No intravenous contrast was administered. COMPARISON:  MRI L  spine 07/26/22, CT L Spine 09/16/22 FINDINGS: Segmentation: In keeping with prior numbering convention the last well-formed disc space is labeled L5-S1. Alignment:  There is mild anterolisthesis of L3 on L4. Vertebrae: Redemonstrated are multiple compression deformities in the lumbar spine, including at the L2, L3, L4, and L5 levels. These compression deformities appear grossly unchanged compared to prior lumbar spine MRI dated 07/26/2022. On CT of the L-spine obtained on 09/16/2022 there is irregular endplate erosion, best seen at the L4-L5 level. Surrounding these compression deformities is extensive T2 hyperintense signal abnormality in the paraspinal soft tissues with extension into the bilateral psoas musculature (series 7, images 24-32). Abnormal T2 signal is also seen along the ventral margin (series 7, image 20) of the vertebral bodies. Compared to prior exam from August, the degree of soft tissue abnormality has increased, best seen at the L2-L3 disc space level. Compared to prior exam the amount of T2 heterogeneous soft tissue within the ventral epidural space at this level has also increased (series 7,image 25) now resulting in moderate to severe spinal canal stenosis, previously mild-to-moderate. There is also increased abnormal soft tissue in the ventral epidural space along the posterior margin of the L3 vertebral body (series 2, image 8) with persistent moderate to severe spinal canal narrowing at the L3-L4 disc space level. There is persistent severe spinal canal narrowing at the L5-S1 level secondary to a central disc protrusion. Conus medullaris and cauda equina: Conus extends to the L1 level. Conus and cauda equina appear normal. IMPRESSION: 1. Findings are concerning for progression of discitis/osteomyelitis with increased soft tissue extent of infection in the paraspinal and epidural spaces. Compared to prior exam from 07/26/2022, there is increased abnormal soft tissue in the ventral epidural  space at the L2-L3 and L3-L4 disc space levels resulting in moderate to severe spinal canal narrowing at these levels. There is also increased signal abnormality in the surrounding paraspinal soft tissues and bilateral psoas musculature. 2. Persistent severe spinal canal narrowing at the L5-S1 level secondary to a central disc protrusion. 3. Stable compression deformities of the L2, L3, L4, and L5 vertebral bodies with irregular endplate erosion at the L4-L5 level, best seen on CT of the L-spine obtained on 09/16/2022. Electronically Signed   By: Marin Roberts M.D.   On: 09/17/2022 16:26   US THYROID  Result Date: 09/17/2022 CLINICAL DATA:  Nodule EXAM: THYROID ULTRASOUND TECHNIQUE: Ultrasound examination of the thyroid gland and adjacent soft tissues was  performed. COMPARISON:  None Available. FINDINGS: Parenchymal Echotexture: Moderately heterogenous Isthmus: 0.5 cm thickness Right lobe: 3.6 x 1.6 x 1.3 cm Left lobe: 3.5 x 2 x 2 cm _________________________________________________________ Estimated total number of nodules >/= 1 cm: 2 Number of spongiform nodules >/=  2 cm not described below (TR1): 0 Number of mixed cystic and solid nodules >/= 1.5 cm not described below (TR2): 0 _________________________________________________________ Nodule # 1: Location: Right; mid Maximum size: 1.2 cm; Other 2 dimensions: 0.8 x 1.2 cm Composition: solid/almost completely solid (2) Echogenicity: hypoechoic (2) Shape: not taller-than-wide (0) Margins: smooth (0) Echogenic foci: none (0) ACR TI-RADS total points: 4. ACR TI-RADS risk category: TR 4. ACR TI-RADS recommendations: *Given size (>/= 1 - 1.4 cm) and appearance, a follow-up ultrasound in 1 year should be considered based on TI-RADS criteria. _________________________________________________________ Nodule # 2: Location: Left; mid Maximum size: 1.4 cm; Other 2 dimensions: 1.1 x 1.2 cm Composition: solid/almost completely solid (2) Echogenicity: hypoechoic (2) Shape:  not taller-than-wide (0) Margins: smooth (0) Echogenic foci: peripheral calcifications (2) ACR TI-RADS total points: 6. ACR TI-RADS risk category: TR 4. ACR TI-RADS recommendations: *Given size (>/= 1 - 1.4 cm) and appearance, a follow-up ultrasound in 1 year should be considered based on TI-RADS criteria. _________________________________________________________ No regional cervical adenopathy. IMPRESSION: 1. Normal-sized thyroid with bilateral nodules. Neither meets criteria for biopsy. Recommend annual/biennial ultrasound follow-up as above, until stability x5 years confirmed. The above is in keeping with the ACR TI-RADS recommendations - J Am Coll Radiol 2017;14:587-595. Electronically Signed   By: Lucrezia Europe M.D.   On: 09/17/2022 12:07   CT Lumbar Spine Wo Contrast  Result Date: 09/17/2022 CLINICAL DATA:  Lumbar compression fracture EXAM: CT LUMBAR SPINE WITHOUT CONTRAST TECHNIQUE: Multidetector CT imaging of the lumbar spine was performed without intravenous contrast administration. Multiplanar CT image reconstructions were also generated. RADIATION DOSE REDUCTION: This exam was performed according to the departmental dose-optimization program which includes automated exposure control, adjustment of the mA and/or kV according to patient size and/or use of iterative reconstruction technique. COMPARISON:  07/25/2022 FINDINGS: Segmentation: Transitional L5 vertebrae with bilateral assimilation joints. Alignment: Grade 1 anterolisthesis at L3-4 Vertebrae: Diffuse osteopenia. There is no acute compression fracture. There is redemonstration of a nonacute L3 compression fracture with an osteonecrotic cleft beneath the superior endplate which has expanded slightly but is otherwise unchanged. Advanced degenerative disc disease with endplate remodeling at Z6-1 is unchanged. Paraspinal and other soft tissues: Calcific aortic atherosclerosis Disc levels: L1-2: Mild disc bulge.  Without high-grade stenosis L2-3:  Unchanged retropulsion of posterosuperior corner with moderate spinal canal stenosis. L3-4: Unchanged severe spinal canal stenosis due to combination of ligamentum flavum bulk and large disc bulge. L4-5: Moderate disc bulge with endplate spurring. Unchanged mild spinal canal stenosis. L5-S1: Transitional level IMPRESSION: 1. No acute compression fracture. 2. Unchanged nonacute L3 compression fracture with an osteonecrotic cleft beneath the superior endplate 3. Unchanged severe spinal canal stenosis at L3-4 and moderate spinal canal stenosis at L2-3. Aortic Atherosclerosis (ICD10-I70.0). Electronically Signed   By: Ulyses Jarred M.D.   On: 09/17/2022 00:04   CT Cervical Spine Wo Contrast  Result Date: 09/16/2022 CLINICAL DATA:  Polytrauma, blunt Level 2 fall EXAM: CT HEAD WITHOUT CONTRAST CT CERVICAL SPINE WITHOUT CONTRAST TECHNIQUE: Multidetector CT imaging of the head and cervical spine was performed following the standard protocol without intravenous contrast. Multiplanar CT image reconstructions of the cervical spine were also generated. RADIATION DOSE REDUCTION: This exam was performed according to the departmental dose-optimization  program which includes automated exposure control, adjustment of the mA and/or kV according to patient size and/or use of iterative reconstruction technique. COMPARISON:  MR thoracic spine 07/26/2022 FINDINGS: CT HEAD FINDINGS BRAIN: BRAIN Cerebral ventricle sizes are concordant with the degree of cerebral volume loss. Patchy and confluent areas of decreased attenuation are noted throughout the deep and periventricular white matter of the cerebral hemispheres bilaterally, compatible with chronic microvascular ischemic disease. No evidence of large-territorial acute infarction. No parenchymal hemorrhage. No mass lesion. No extra-axial collection. No mass effect or midline shift. No hydrocephalus. Basilar cisterns are patent. Empty sella. Vascular: No hyperdense vessel.  Atherosclerotic calcifications are present within the cavernous internal carotid arteries. Skull: No acute fracture or focal lesion. Sinuses/Orbits: Paranasal sinuses and mastoid air cells are clear. The orbits are unremarkable. Other: None. CT CERVICAL SPINE FINDINGS Alignment: Similar-appearing retrolisthesis of C5 on C6. Skull base and vertebrae: Similar-appearing severe degenerative changes of the C5-C6 level with anterior wedge deformity of the C5 level. Posterior disc osteophyte complex formation at the C3-C4 and C4-C5 level. No severe osseous neural foraminal or central canal stenosis. No acute fracture. No aggressive appearing focal osseous lesion or focal pathologic process. Soft tissues and spinal canal: No prevertebral fluid or swelling. No visible canal hematoma. Upper chest: Unremarkable. Other: Severe atherosclerotic plaque of the visualized thoracic aorta. A 1.5 cm calcified hypodensity within the left thyroid gland. IMPRESSION: 1.  No acute intracranial abnormality. 2. No acute displaced fracture or traumatic listhesis of the cervical spine in a patient with chronic severe C5-C6 degenerative changes. 3. Empty sella. Findings is often a normal anatomic variant but can be associated with idiopathic intracranial hypertension (pseudotumor cerebri). 4. A 1.5 cm calcified hypodensity within the left thyroid gland. Recommend thyroid US (ref: J Am Coll Radiol. 2015 Feb;12(2): 143-50). Electronically Signed   By: Iven Finn M.D.   On: 09/16/2022 23:17   CT HEAD WO CONTRAST (5MM)  Result Date: 09/16/2022 CLINICAL DATA:  Polytrauma, blunt Level 2 fall EXAM: CT HEAD WITHOUT CONTRAST CT CERVICAL SPINE WITHOUT CONTRAST TECHNIQUE: Multidetector CT imaging of the head and cervical spine was performed following the standard protocol without intravenous contrast. Multiplanar CT image reconstructions of the cervical spine were also generated. RADIATION DOSE REDUCTION: This exam was performed according to the  departmental dose-optimization program which includes automated exposure control, adjustment of the mA and/or kV according to patient size and/or use of iterative reconstruction technique. COMPARISON:  MR thoracic spine 07/26/2022 FINDINGS: CT HEAD FINDINGS BRAIN: BRAIN Cerebral ventricle sizes are concordant with the degree of cerebral volume loss. Patchy and confluent areas of decreased attenuation are noted throughout the deep and periventricular white matter of the cerebral hemispheres bilaterally, compatible with chronic microvascular ischemic disease. No evidence of large-territorial acute infarction. No parenchymal hemorrhage. No mass lesion. No extra-axial collection. No mass effect or midline shift. No hydrocephalus. Basilar cisterns are patent. Empty sella. Vascular: No hyperdense vessel. Atherosclerotic calcifications are present within the cavernous internal carotid arteries. Skull: No acute fracture or focal lesion. Sinuses/Orbits: Paranasal sinuses and mastoid air cells are clear. The orbits are unremarkable. Other: None. CT CERVICAL SPINE FINDINGS Alignment: Similar-appearing retrolisthesis of C5 on C6. Skull base and vertebrae: Similar-appearing severe degenerative changes of the C5-C6 level with anterior wedge deformity of the C5 level. Posterior disc osteophyte complex formation at the C3-C4 and C4-C5 level. No severe osseous neural foraminal or central canal stenosis. No acute fracture. No aggressive appearing focal osseous lesion or focal pathologic process. Soft tissues and  spinal canal: No prevertebral fluid or swelling. No visible canal hematoma. Upper chest: Unremarkable. Other: Severe atherosclerotic plaque of the visualized thoracic aorta. A 1.5 cm calcified hypodensity within the left thyroid gland. IMPRESSION: 1.  No acute intracranial abnormality. 2. No acute displaced fracture or traumatic listhesis of the cervical spine in a patient with chronic severe C5-C6 degenerative changes. 3.  Empty sella. Findings is often a normal anatomic variant but can be associated with idiopathic intracranial hypertension (pseudotumor cerebri). 4. A 1.5 cm calcified hypodensity within the left thyroid gland. Recommend thyroid US (ref: J Am Coll Radiol. 2015 Feb;12(2): 143-50). Electronically Signed   By: Iven Finn M.D.   On: 09/16/2022 23:17   DG Pelvis Portable  Result Date: 09/16/2022 CLINICAL DATA:  Level 2 trauma, fall. History of fracture 2 lower back from years ago. EXAM: PORTABLE PELVIS 1-2 VIEWS COMPARISON:  07/07/2022. FINDINGS: Examination is limited due to patient rotation. The sacrum is not well evaluated on exam due to overlying bowel gas. No obvious acute fracture or dislocation. There is stable bony deformity of the inferior pubic ramus on the left, which may be due to old trauma. IMPRESSION: No acute fracture or dislocation. Electronically Signed   By: Brett Fairy M.D.   On: 09/16/2022 22:52   DG Chest Port 1 View  Result Date: 09/16/2022 CLINICAL DATA:  Level 2 trauma, fall. History of fracture and lower back for years ago. EXAM: PORTABLE CHEST 1 VIEW COMPARISON:  None Available. FINDINGS: The heart size and mediastinal contours are within normal limits. There is atherosclerotic calcification of the aorta. No consolidation, effusion, or pneumothorax. No acute osseous abnormality is seen. IMPRESSION: No active disease. Electronically Signed   By: Brett Fairy M.D.   On: 09/16/2022 22:46   DG Wrist Complete Right  Result Date: 09/16/2022 CLINICAL DATA:  Level 2 trauma, fall. EXAM: RIGHT WRIST - COMPLETE 3+ VIEW COMPARISON:  01/16/2021. FINDINGS: There is no evidence of fracture or dislocation. Mild degenerative changes are present at the first carpometacarpal joint and wrist. Vascular calcifications are noted in the soft tissues. There is chondrocalcinosis with calcification at the TFCC. IMPRESSION: No acute fracture or dislocation. Electronically Signed   By: Brett Fairy  M.D.   On: 09/16/2022 22:45   Medications:  diltiazem (CARDIZEM) infusion 5 mg/hr (09/18/22 1026)   vancomycin      apixaban  2.5 mg Oral BID   Chlorhexidine Gluconate Cloth  6 each Topical Q0600   hydrALAZINE  100 mg Oral 2 times per day on Tue Thu Sat   hydrALAZINE  100 mg Oral 3 times per day on Sun Mon Wed Fri   metoprolol tartrate  50 mg Oral BID   mometasone-formoterol  2 puff Inhalation BID   pantoprazole  40 mg Oral BID   sevelamer carbonate  1,600 mg Oral TID WC   vancomycin variable dose per unstable renal function (pharmacist dosing)   Does not apply See admin instructions

## 2022-09-18 NOTE — Progress Notes (Signed)
OT Cancellation Note  Patient Details Name: Diamond [name] MRN: [id number] DOB: [birth year]   Cancelled Treatment:    Reason Eval/Treat Not Completed: Patient at procedure or test/ unavailable. Pt at dialysis. Will return as schedule allows.   Shanda Howells, OTR/L [facility] Acute Rehabilitation Office: [phone]   Diamond [name] 09/18/2022, 1:37 PM

## 2022-09-18 NOTE — Progress Notes (Signed)
PT Cancellation Note  Patient Details Name: CLAIRISSA VALVANO MRN: [id number] DOB: [birth year]   Cancelled Treatment:    Reason Eval/Treat Not Completed: Patient at procedure or test/unavailable  Noted pt was cleared by neurology but is now at HD.  PT to check back on pt tomorrow to reattempt evaluation.     Melvern Banker 09/18/2022, 1:00 PM Lavonia Dana, [place]  Office [phone] 09/18/2022

## 2022-09-18 NOTE — Progress Notes (Signed)
PROGRESS NOTE  Diamond [name] KDX:[id number] DOB: [birth year] DOA: 09/16/2022 PCP: [name], MD   HPI/Recap of past 24 hours: Diamond [name] is a 71 y.o. female with medical history significant of ESRD on HD, PAF on eliquis, HTN, h/o recurrent MRSA bacteremia and vertebral discitis, presents the ED following a fall at home and some acute on chronic back pain.  Patient also noted to be intermittently confused, reported missing a dialysis session due to back pain as per patient.  In the ED, work-up for any traumatic injury was negative including CT head/spine.  While in the ED, patient noted to spike a temp, blood cultures were taken and patient was started on IV vancomycin and cefepime.  Of note, patient recently finished 4 weeks of IV ABx (looks like vanc) 8/8.  Readmitted 8/22 for acute on chronic back pain with MRI concerning for l2-3 paravertebral edema and still elevated crp.  Treated with another 4 weeks of Bactrim PO.  Due to history, ID was consulted, as well as nephrology for HD.  Patient admitted for further management.     Today, patient denies any new complaints, still with some back pain.  Was surprised when I mentioned about her back infection.  Noted to go into atrial fibrillation with RVR overnight.      Assessment/Plan: Principal Problem:   Acute metabolic encephalopathy Active Problems:   Hypertensive urgency   ESRD on hemodialysis (HCC)   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    Paroxysmal atrial flutter (HCC)   Thyroid nodule   Empty sella (HCC)   Acute on chronic back pain MRSA bacteremia and vertebral discitis Had a temp spike, with no leukocytosis BC x2 growing MRSA Procalcitonin 0.62, CRP 19, will trend UA negative Chest x-ray unremarkable Echo pending CT lumbar spine without contrast unremarkable for any fracture or collection ID consulted due to history, recommend MRI lumbar spine for further evaluation MRI lumbar  spine showed progression of discitis with increased soft tissue extent of infection in the paraspinal and epidural spaces. Neurosurgery consulted no further work-up recommended Continue vancomycin  Monitor closely PT/OT, fall precautions  Hypertensive urgency Cont home PO hydralazine, lopressor Hopefully will improve with HD  Acute metabolic encephalopathy Improving May be due to HTN encephalopathy vs infection Management as above   ESRD on hemodialysis ([place]) 1 missed dialysis session Nephrology on board, plan for HD on 10/13 and 10/14 to get back on schedule  Paroxysmal atrial flutter (HCC) Noted to be in A-fib with RVR on 09/17/2022, started on Cardizem drip, heart rate now controlled, plan to wean off once able Cont home metoprolol Cont home eliquis   History of MRSA bacteremia and discitis  Pt finished 4 weeks IV Abx, followed by 4 more weeks of bactrim for discitis Followed by ID as an outpatient   Empty sella ([place]) Seen on CT Pt denies headache, recent visual changes Monitor   Thyroid nodule Thyroid ultrasound showed normal-sized thyroid with bilateral nodules, does not meet any criteria for biopsy      Estimated body mass index is 18.65 kg/m as calculated from the following:   Height as of this encounter: '5\' 6"'$  (1.676 m).   Weight as of this encounter: 52.4 kg.     Code Status: Full  Family Communication: None at bedside  Disposition Plan: Status is: Inpatient Remains inpatient appropriate because: Level of care      Consultants: Nephrology ID  Procedures: None  Antimicrobials: Vancomycin  DVT prophylaxis: Eliquis  Objective: Vitals:   09/18/22 1430 09/18/22 1435 09/18/22 1500 09/18/22 1530  BP: (!) 160/90  (!) 161/105 (!) 160/110  Pulse:      Resp:      Temp:      TempSrc:      SpO2:      Weight:  52.4 kg    Height:        Intake/Output Summary (Last 24 hours) at 09/18/2022 1559 Last data filed at 09/18/2022 1026 Gross per  24 hour  Intake 56.62 ml  Output 0 ml  Net 56.62 ml   Filed Weights   09/16/22 2228 09/18/22 1435  Weight: 50 kg 52.4 kg    Exam: General: NAD, appears dry, lethargic, cachectic, awake/alert/oriented Cardiovascular: S1, S2 present Respiratory: CTAB Abdomen: Soft, nontender, nondistended, bowel sounds present Musculoskeletal: No bilateral pedal edema noted Skin: Dry Psychiatry: Normal mood  Neurology: No obvious focal neurologic deficits noted    Data Reviewed: CBC: Recent Labs  Lab 09/15/22 0449 09/16/22 2242 09/16/22 2248 09/17/22 0402 09/18/22 0546  WBC 4.7 5.5  --  6.5 5.5  NEUTROABS 3,201  --   --   --  4.0  HGB 11.1* 10.0* 11.2* 9.5* 10.7*  HCT 34.3* 31.6* 33.0* 29.4* 31.4*  MCV 102.4* 104.6*  --  102.1* 99.1  PLT 129* 100*  --  99* 829*   Basic Metabolic Panel: Recent Labs  Lab 09/15/22 0449 09/16/22 2242 09/16/22 2248 09/17/22 0402 09/18/22 0721  NA 139 138 136 137 136  K 4.4 4.3 4.3 5.4* 3.8  CL 97* 99 100 100 95*  CO2 30 25  --  26 27  GLUCOSE 102* 96 88 90 81  BUN 31* 39* 36* 42* 23  CREATININE 5.28* 7.28* 7.30* 7.53* 4.72*  CALCIUM 10.2 10.2  --  10.0 9.8  PHOS  --   --   --   --  4.6   GFR: Estimated Creatinine Clearance: 8.9 mL/min (A) (by C-G formula based on SCr of 4.72 mg/dL (H)). Liver Function Tests: Recent Labs  Lab 09/15/22 0449 09/16/22 2242 09/18/22 0721  AST 14 14*  --   ALT 8 10  --   ALKPHOS  --  40  --   BILITOT 0.5 0.7  --   PROT 7.0 6.5  --   ALBUMIN  --  2.9* 2.9*   No results for input(s): "LIPASE", "AMYLASE" in the last 168 hours. Recent Labs  Lab 09/17/22 0134  AMMONIA 24   Coagulation Profile: Recent Labs  Lab 09/16/22 2242  INR 1.1   Cardiac Enzymes: No results for input(s): "CKTOTAL", "CKMB", "CKMBINDEX", "TROPONINI" in the last 168 hours. BNP (last 3 results) No results for input(s): "PROBNP" in the last 8760 hours. HbA1C: No results for input(s): "HGBA1C" in the last 72 hours. CBG: No  results for input(s): "GLUCAP" in the last 168 hours. Lipid Profile: No results for input(s): "CHOL", "HDL", "LDLCALC", "TRIG", "CHOLHDL", "LDLDIRECT" in the last 72 hours. Thyroid Function Tests: No results for input(s): "TSH", "T4TOTAL", "FREET4", "T3FREE", "THYROIDAB" in the last 72 hours. Anemia Panel: No results for input(s): "VITAMINB12", "FOLATE", "FERRITIN", "TIBC", "IRON", "RETICCTPCT" in the last 72 hours. Urine analysis:    Component Value Date/Time   COLORURINE YELLOW 09/17/2022 0137   APPEARANCEUR CLOUDY (A) 09/17/2022 0137   LABSPEC 1.008 09/17/2022 0137   PHURINE 9.0 (H) 09/17/2022 [place] 09/17/2022 [place] 09/17/2022 [place] 09/17/2022 [place] 09/17/2022 0137  PROTEINUR 100 (A) 09/17/2022 0137   UROBILINOGEN 0.2 09/27/2021 1329   NITRITE NEGATIVE 09/17/2022 0137   LEUKOCYTESUR NEGATIVE 09/17/2022 0137   Sepsis Labs: '@LABRCNTIP'$ (procalcitonin:4,lacticidven:4)  ) Recent Results (from the past 240 hour(s))  Culture, blood (Routine X 2) w Reflex to ID Panel     Status: Abnormal (Preliminary result)   Collection Time: 09/17/22  7:58 AM   Specimen: BLOOD RIGHT ARM  Result Value Ref Range Status   Specimen Description BLOOD RIGHT ARM  Final   Special Requests   Final    BOTTLES DRAWN AEROBIC AND ANAEROBIC Blood Culture adequate volume   Culture  Setup Time   Final    GRAM POSITIVE COCCI IN CLUSTERS ANAEROBIC BOTTLE ONLY Organism ID to follow CRITICAL RESULT CALLED TO, READ BACK BY AND VERIFIED WITH: T RUDISILL,PHARMD'@0148'$  09/18/22 [place]    Culture (A)  Final    STAPHYLOCOCCUS AUREUS CULTURE REINCUBATED FOR BETTER GROWTH Performed at [facility] [address] [place], [place] [address]    Report Status PENDING  Incomplete  Blood Culture ID Panel (Reflexed)     Status: Abnormal   Collection Time: 09/17/22  7:58 AM  Result Value Ref Range Status   Enterococcus faecalis NOT DETECTED NOT  DETECTED Final   Enterococcus Faecium NOT DETECTED NOT DETECTED Final   Listeria monocytogenes NOT DETECTED NOT DETECTED Final   Staphylococcus species DETECTED (A) NOT DETECTED Final    Comment: CRITICAL RESULT CALLED TO, READ BACK BY AND VERIFIED WITH: T RUDISILL,PHARMD'@0148'$  09/18/22 [place]    Staphylococcus aureus (BCID) DETECTED (A) NOT DETECTED Final    Comment: Methicillin (oxacillin)-resistant Staphylococcus aureus (MRSA). MRSA is predictably resistant to beta-lactam antibiotics (except ceftaroline). Preferred therapy is vancomycin unless clinically contraindicated. Patient requires contact precautions if  hospitalized. CRITICAL RESULT CALLED TO, READ BACK BY AND VERIFIED WITH: T RUDISILL,PHARMD'@0148'$  09/18/22 [place]    Staphylococcus epidermidis NOT DETECTED NOT DETECTED Final   Staphylococcus lugdunensis NOT DETECTED NOT DETECTED Final   Streptococcus species NOT DETECTED NOT DETECTED Final   Streptococcus agalactiae NOT DETECTED NOT DETECTED Final   Streptococcus pneumoniae NOT DETECTED NOT DETECTED Final   Streptococcus pyogenes NOT DETECTED NOT DETECTED Final   A.calcoaceticus-baumannii NOT DETECTED NOT DETECTED Final   Bacteroides fragilis NOT DETECTED NOT DETECTED Final   Enterobacterales NOT DETECTED NOT DETECTED Final   Enterobacter cloacae complex NOT DETECTED NOT DETECTED Final   Escherichia coli NOT DETECTED NOT DETECTED Final   Klebsiella aerogenes NOT DETECTED NOT DETECTED Final   Klebsiella oxytoca NOT DETECTED NOT DETECTED Final   Klebsiella pneumoniae NOT DETECTED NOT DETECTED Final   Proteus species NOT DETECTED NOT DETECTED Final   Salmonella species NOT DETECTED NOT DETECTED Final   Serratia marcescens NOT DETECTED NOT DETECTED Final   Haemophilus influenzae NOT DETECTED NOT DETECTED Final   Neisseria meningitidis NOT DETECTED NOT DETECTED Final   Pseudomonas aeruginosa NOT DETECTED NOT DETECTED Final   Stenotrophomonas maltophilia NOT DETECTED NOT DETECTED Final    Candida albicans NOT DETECTED NOT DETECTED Final   Candida auris NOT DETECTED NOT DETECTED Final   Candida glabrata NOT DETECTED NOT DETECTED Final   Candida krusei NOT DETECTED NOT DETECTED Final   Candida parapsilosis NOT DETECTED NOT DETECTED Final   Candida tropicalis NOT DETECTED NOT DETECTED Final   Cryptococcus neoformans/gattii NOT DETECTED NOT DETECTED Final   Meth resistant mecA/C and MREJ DETECTED (A) NOT DETECTED Final    Comment: CRITICAL RESULT CALLED TO, READ BACK BY AND VERIFIED WITH: T RUDISILL,PHARMD'@0148'$  09/18/22  [facility] Performed at [facility], [place] [address], [place], [place] [address]   Culture, blood (Routine X 2) w Reflex to ID Panel     Status: None (Preliminary result)   Collection Time: 09/17/22  8:03 AM   Specimen: BLOOD RIGHT HAND  Result Value Ref Range Status   Specimen Description BLOOD RIGHT HAND  Final   Special Requests   Final    BOTTLES DRAWN AEROBIC ONLY Blood Culture results may not be optimal due to an inadequate volume of blood received in culture bottles   Culture   Final    NO GROWTH < 24 HOURS Performed at [facility], [place] [address], [place],  [address]    Report Status PENDING  Incomplete      Studies: MR LUMBAR SPINE WO CONTRAST  Result Date: 09/17/2022 CLINICAL DATA:  Epidural abscess suspect EXAM: MRI LUMBAR SPINE WITHOUT CONTRAST TECHNIQUE: Multiplanar, multisequence MR imaging of the lumbar spine was performed. No intravenous contrast was administered. COMPARISON:  MRI L spine 07/26/22, CT L Spine 09/16/22 FINDINGS: Segmentation: In keeping with prior numbering convention the last well-formed disc space is labeled L5-S1. Alignment:  There is mild anterolisthesis of L3 on L4. Vertebrae: Redemonstrated are multiple compression deformities in the lumbar spine, including at the L2, L3, L4, and L5 levels. These compression deformities appear grossly unchanged compared to prior lumbar spine MRI dated 07/26/2022. On CT of the  L-spine obtained on 09/16/2022 there is irregular endplate erosion, best seen at the L4-L5 level. Surrounding these compression deformities is extensive T2 hyperintense signal abnormality in the paraspinal soft tissues with extension into the bilateral psoas musculature (series 7, images 24-32). Abnormal T2 signal is also seen along the ventral margin (series 7, image 20) of the vertebral bodies. Compared to prior exam from August, the degree of soft tissue abnormality has increased, best seen at the L2-L3 disc space level. Compared to prior exam the amount of T2 heterogeneous soft tissue within the ventral epidural space at this level has also increased (series 7,image 25) now resulting in moderate to severe spinal canal stenosis, previously mild-to-moderate. There is also increased abnormal soft tissue in the ventral epidural space along the posterior margin of the L3 vertebral body (series 2, image 8) with persistent moderate to severe spinal canal narrowing at the L3-L4 disc space level. There is persistent severe spinal canal narrowing at the L5-S1 level secondary to a central disc protrusion. Conus medullaris and cauda equina: Conus extends to the L1 level. Conus and cauda equina appear normal. IMPRESSION: 1. Findings are concerning for progression of discitis/osteomyelitis with increased soft tissue extent of infection in the paraspinal and epidural spaces. Compared to prior exam from 07/26/2022, there is increased abnormal soft tissue in the ventral epidural space at the L2-L3 and L3-L4 disc space levels resulting in moderate to severe spinal canal narrowing at these levels. There is also increased signal abnormality in the surrounding paraspinal soft tissues and bilateral psoas musculature. 2. Persistent severe spinal canal narrowing at the L5-S1 level secondary to a central disc protrusion. 3. Stable compression deformities of the L2, L3, L4, and L5 vertebral bodies with irregular endplate erosion at the  L4-L5 level, best seen on CT of the L-spine obtained on 09/16/2022. Electronically Signed   By: Marin Roberts M.D.   On: 09/17/2022 16:26    Scheduled Meds:  apixaban  2.5 mg Oral BID   Chlorhexidine Gluconate Cloth  6 each Topical Q0600   hydrALAZINE  100  mg Oral 2 times per day on Tue Thu Sat   hydrALAZINE  100 mg Oral 3 times per day on Sun Mon Wed Fri   metoprolol tartrate  50 mg Oral BID   mometasone-formoterol  2 puff Inhalation BID   pantoprazole  40 mg Oral BID   sevelamer carbonate  1,600 mg Oral TID WC   vancomycin variable dose per unstable renal function (pharmacist dosing)   Does not apply See admin instructions    Continuous Infusions:  diltiazem (CARDIZEM) infusion 5 mg/hr (09/18/22 1026)   vancomycin 500 mg (09/18/22 1550)     LOS: 1 day     Alma Friendly, MD [facility]  If 7PM-7AM, please contact night-coverage [email or web address] 09/18/2022, 3:59 PM

## 2022-09-18 NOTE — Progress Notes (Signed)
PHARMACY - PHYSICIAN COMMUNICATION CRITICAL VALUE ALERT - BLOOD CULTURE IDENTIFICATION (BCID)  Diamond [name] is an 71 y.o. female who presented to [facility] on 09/16/2022 with a chief complaint of fall at home. Pt with h/o recurrent MRSA bacteremia due to endocarditis and diskitis / osteomyelitis  Assessment:  MRSA   Name of physician (or Provider) Contacted: Milderd Meager  Current antibiotics: Vancomycin  Changes to prescribed antibiotics recommended:  Patient is on recommended antibiotics - No changes needed  Results for orders placed or performed during the hospital encounter of 09/16/22  Blood Culture ID Panel (Reflexed) (Collected: 09/17/2022  7:58 AM)  Result Value Ref Range   Enterococcus faecalis NOT DETECTED NOT DETECTED   Enterococcus Faecium NOT DETECTED NOT DETECTED   Listeria monocytogenes NOT DETECTED NOT DETECTED   Staphylococcus species DETECTED (A) NOT DETECTED   Staphylococcus aureus (BCID) DETECTED (A) NOT DETECTED   Staphylococcus epidermidis NOT DETECTED NOT DETECTED   Staphylococcus lugdunensis NOT DETECTED NOT DETECTED   Streptococcus species NOT DETECTED NOT DETECTED   Streptococcus agalactiae NOT DETECTED NOT DETECTED   Streptococcus pneumoniae NOT DETECTED NOT DETECTED   Streptococcus pyogenes NOT DETECTED NOT DETECTED   A.calcoaceticus-baumannii NOT DETECTED NOT DETECTED   Bacteroides fragilis NOT DETECTED NOT DETECTED   Enterobacterales NOT DETECTED NOT DETECTED   Enterobacter cloacae complex NOT DETECTED NOT DETECTED   Escherichia coli NOT DETECTED NOT DETECTED   Klebsiella aerogenes NOT DETECTED NOT DETECTED   Klebsiella oxytoca NOT DETECTED NOT DETECTED   Klebsiella pneumoniae NOT DETECTED NOT DETECTED   Proteus species NOT DETECTED NOT DETECTED   Salmonella species NOT DETECTED NOT DETECTED   Serratia marcescens NOT DETECTED NOT DETECTED   Haemophilus influenzae NOT DETECTED NOT DETECTED   Neisseria meningitidis NOT DETECTED NOT DETECTED    Pseudomonas aeruginosa NOT DETECTED NOT DETECTED   Stenotrophomonas maltophilia NOT DETECTED NOT DETECTED   Candida albicans NOT DETECTED NOT DETECTED   Candida auris NOT DETECTED NOT DETECTED   Candida glabrata NOT DETECTED NOT DETECTED   Candida krusei NOT DETECTED NOT DETECTED   Candida parapsilosis NOT DETECTED NOT DETECTED   Candida tropicalis NOT DETECTED NOT DETECTED   Cryptococcus neoformans/gattii NOT DETECTED NOT DETECTED   Meth resistant mecA/C and MREJ DETECTED (A) NOT DETECTED    [name] 09/18/2022  1:55 AM

## 2022-09-18 NOTE — Progress Notes (Signed)
PT Cancellation Note  Patient Details Name: Diamond [name] MRN: [id number] DOB: 23-Dec-1950   Cancelled Treatment:    Reason Eval/Treat Not Completed: Medical issues which prohibited therapy (Results of MRI noted with neurosurgery consult pending.  Will await that consult prior to initiating PT evaluation.)   Melvern Banker 09/18/2022, 10:29 AM Lavonia Dana, [place]  Office [phone] 09/18/2022

## 2022-09-18 NOTE — Progress Notes (Signed)
Received patient in bed to unit.  Alert and oriented.  Informed consent signed and in chart.   Treatment initiated: 1320 Treatment completed: 1620  Patient tolerated well.  Transported back to the room  Alert, without acute distress.  Hand-off given to patient's nurse.   Access used: AVF  Total UF removed: 0 Medication(s) given: Vancomycin 500 mg,  Post HD VS: 170/110 P 70 R 20 Post HD weight: 52.4 kg   [facility]

## 2022-09-18 NOTE — Consult Note (Signed)
Providing Compassionate, Quality Care - Together   Reason for Consult: Osteomyelitis Referring Physician: Dr. Charissa Bash is an 71 y.o. female.  HPI: Ms. Dortch is a 71 year old female with a history of end-stage renal disease on hemodialysis, hypertension, atrial flutter, and MRSA bacteremia. She finished a four week course of antibiotics on 07/13/2022, followed by another four weeks of Bactrim PO. She was admitted on 09/16/2022 with increased confusion following a mechanical fall. Her family reported that the patient had missed two dialysis sessions, which has caused the patient confusion in the past. Non-contrast CT imaging of the lumbar spine did not show any new fractures or fluid collection. Lumbar MRI demonstrated possible progression of discitis/osteomyelitis at L2-3 and L3-4, with an increase in abnormal soft tissue in the ventral epidural space. Neurosurgery was consulted for further evaluation and recommendations.  Past Medical History:  Diagnosis Date   Anemia    Asthma    Atrial flutter with rapid ventricular response ([place]) 12/25/2021   Chronic kidney disease    dialysis Tues Thurs Sat   Gout    Hypertension     Past Surgical History:  Procedure Laterality Date   AV FISTULA PLACEMENT Left 12/23/2021   Procedure: LEFT ARM BRACHIOBASILIC VEIN ARTERIOVENOUS (AV) FISTULA CREATION;  Surgeon: [name], MD;  Location: [place];  Service: Vascular;  Laterality: Left;   [place] Left 03/08/2022   Procedure: LEFT SECOND STAGE BASILIC VEIN TRANSPOSITION;  Surgeon: [name], MD;  Location: [facility] OR;  Service: Vascular;  Laterality: Left;   BIOPSY  01/19/2022   Procedure: BIOPSY;  Surgeon: Lavena Bullion, DO;  Location: [place] ENDOSCOPY;  Service: Gastroenterology;;   [place] N/A 06/25/2022   Procedure: OPEN CHOLECYSTECTOMY;  Surgeon: Dwan Bolt, MD;  Location: [place];  Service: General;  Laterality: N/A;    COLONOSCOPY Left 01/19/2022   Procedure: COLONOSCOPY;  Surgeon: Lavena Bullion, DO;  Location: [place];  Service: Gastroenterology;  Laterality: Left;   ESOPHAGOGASTRODUODENOSCOPY Left 01/19/2022   Procedure: ESOPHAGOGASTRODUODENOSCOPY (EGD);  Surgeon: Lavena Bullion, DO;  Location: [facility] ENDOSCOPY;  Service: Gastroenterology;  Laterality: Left;   INSERTION OF DIALYSIS CATHETER Right 12/23/2021   Procedure: INSERTION OF DIALYSIS CATHETER;  Surgeon: [name], MD;  Location: [place];  Service: Vascular;  Laterality: Right;   LAPAROSCOPY N/A 06/25/2022   Procedure: STAGING LAPAROSCOPY;  Surgeon: Dwan Bolt, MD;  Location: [place];  Service: General;  Laterality: N/A;   OPEN PARTIAL HEPATECTOMY  N/A 06/25/2022   Procedure: OPEN PARTIAL HEPATECTOMY;  Surgeon: Dwan Bolt, MD;  Location: [place];  Service: General;  Laterality: N/A;   POLYPECTOMY  01/19/2022   Procedure: POLYPECTOMY;  Surgeon: Lavena Bullion, DO;  Location: [place] ENDOSCOPY;  Service: Gastroenterology;;   TEE WITHOUT CARDIOVERSION N/A 12/22/2021   Procedure: TRANSESOPHAGEAL ECHOCARDIOGRAM (TEE);  Surgeon: Jerline Pain, MD;  Location: [facility] ENDOSCOPY;  Service: Cardiovascular;  Laterality: N/A;   TEE WITHOUT CARDIOVERSION N/A 06/17/2022   Procedure: TRANSESOPHAGEAL ECHOCARDIOGRAM (TEE);  Surgeon: Buford Dresser, MD;  Location: [facility] ENDOSCOPY;  Service: Cardiovascular;  Laterality: N/A;   ULTRASOUND GUIDANCE FOR VASCULAR ACCESS  12/23/2021   Procedure: ULTRASOUND GUIDANCE FOR VASCULAR ACCESS;  Surgeon: [name], MD;  Location: [facility] OR;  Service: Vascular;;    Family History  Problem Relation Age of Onset   Hypertension Mother    Hypertension Father    Colon cancer Brother  Lung cancer Brother     Social History:  reports that she has never smoked. She has never been exposed to tobacco smoke. She has never used smokeless tobacco. She reports that she does not drink alcohol and does not use  drugs.  Allergies:  Allergies  Allergen Reactions   Shellfish Allergy Other (See Comments)    Unknown reaction    Other Rash    Pt states anesthesia makes her brake out in rash    Medications: I have reviewed the patient's current medications.  Results for orders placed or performed during the hospital encounter of 09/16/22 (from the past 48 hour(s))  Comprehensive metabolic panel     Status: Abnormal   Collection Time: 09/16/22 10:42 PM  Result Value Ref Range   Sodium 138 135 - 145 mmol/L   Potassium 4.3 3.5 - 5.1 mmol/L   Chloride 99 98 - 111 mmol/L   CO2 25 22 - 32 mmol/L   Glucose, Bld 96 70 - 99 mg/dL    Comment: Glucose reference range applies only to samples taken after fasting for at least 8 hours.   BUN 39 (H) 8 - 23 mg/dL   Creatinine, Ser 7.28 (H) 0.44 - 1.00 mg/dL   Calcium 10.2 8.9 - 10.3 mg/dL   Total Protein 6.5 6.5 - 8.1 g/dL   Albumin 2.9 (L) 3.5 - 5.0 g/dL   AST 14 (L) 15 - 41 U/L   ALT 10 0 - 44 U/L   Alkaline Phosphatase 40 38 - 126 U/L   Total Bilirubin 0.7 0.3 - 1.2 mg/dL   GFR, Estimated 6 (L) >60 mL/min    Comment: (NOTE) Calculated using the CKD-EPI Creatinine Equation (2021)    Anion gap 14 5 - 15    Comment: Performed at [facility] [address], [place], [place] [address]  CBC     Status: Abnormal   Collection Time: 09/16/22 10:42 PM  Result Value Ref Range   WBC 5.5 4.0 - 10.5 K/uL   RBC 3.02 (L) 3.87 - 5.11 MIL/uL   Hemoglobin 10.0 (L) 12.0 - 15.0 g/dL   HCT 31.6 (L) 36.0 - 46.0 %   MCV 104.6 (H) 80.0 - 100.0 fL   MCH 33.1 26.0 - 34.0 pg   MCHC 31.6 30.0 - 36.0 g/dL   RDW 18.8 (H) 11.5 - 15.5 %   Platelets 100 (L) 150 - 400 K/uL    Comment: REPEATED TO VERIFY   nRBC 0.0 0.0 - 0.2 %    Comment: Performed at [facility], [place] [address], [place], [place] [address]  Ethanol     Status: None   Collection Time: 09/16/22 10:42 PM  Result Value Ref Range   Alcohol, Ethyl (B) <10 <10 mg/dL    Comment: (NOTE) Lowest  detectable limit for serum alcohol is 10 mg/dL.  For medical purposes only. Performed at [facility], [place] [address] [place], [place] [address]   Lactic acid, plasma     Status: None   Collection Time: 09/16/22 10:42 PM  Result Value Ref Range   Lactic Acid, Venous 1.6 0.5 - 1.9 mmol/L    Comment: Performed at [facility] [address], [place], [place] [address]  Protime-INR     Status: None   Collection Time: 09/16/22 10:42 PM  Result Value Ref Range   Prothrombin Time 13.7 11.4 - 15.2 seconds   INR 1.1 0.8 - 1.2    Comment: (NOTE) INR  goal varies based on device and disease states. Performed at [facility], [place] [address], [place], [place] [address]   Sample to Blood Bank     Status: None   Collection Time: 09/16/22 10:42 PM  Result Value Ref Range   Blood Bank Specimen SAMPLE AVAILABLE FOR TESTING    Sample Expiration      09/17/2022,2359 Performed at [facility], [place] [address] [place], [place] [address]   I-Stat Chem 8, ED     Status: Abnormal   Collection Time: 09/16/22 10:48 PM  Result Value Ref Range   Sodium 136 135 - 145 mmol/L   Potassium 4.3 3.5 - 5.1 mmol/L   Chloride 100 98 - 111 mmol/L   BUN 36 (H) 8 - 23 mg/dL   Creatinine, Ser 7.30 (H) 0.44 - 1.00 mg/dL   Glucose, Bld 88 70 - 99 mg/dL    Comment: Glucose reference range applies only to samples taken after fasting for at least 8 hours.   Calcium, Ion 1.17 1.15 - 1.40 mmol/L   TCO2 25 22 - 32 mmol/L   Hemoglobin 11.2 (L) 12.0 - 15.0 g/dL   HCT 33.0 (L) 36.0 - 46.0 %  Ammonia     Status: None   Collection Time: 09/17/22  1:34 AM  Result Value Ref Range   Ammonia 24 9 - 35 umol/L    Comment: Performed at [facility], [place] [address], [place], [place] [address]  Urinalysis, Routine w reflex microscopic     Status: Abnormal   Collection Time: 09/17/22  1:37 AM  Result Value Ref Range   Color, Urine YELLOW YELLOW   APPearance CLOUDY (A) CLEAR   Specific Gravity,  Urine 1.008 1.005 - 1.030   pH 9.0 (H) 5.0 - 8.0   Glucose, UA NEGATIVE NEGATIVE mg/dL   Hgb urine dipstick NEGATIVE NEGATIVE   Bilirubin Urine NEGATIVE NEGATIVE   Ketones, ur NEGATIVE NEGATIVE mg/dL   Protein, ur 100 (A) NEGATIVE mg/dL   Nitrite NEGATIVE NEGATIVE   Leukocytes,Ua NEGATIVE NEGATIVE   RBC / HPF 0-5 0 - 5 RBC/hpf   WBC, UA 0-5 0 - 5 WBC/hpf   Bacteria, UA RARE (A) NONE SEEN   Squamous Epithelial / LPF 0-5 0 - 5   Mucus PRESENT     Comment: Performed at [facility], [place] [address] [place], [place] [address]  CBC     Status: Abnormal   Collection Time: 09/17/22  4:02 AM  Result Value Ref Range   WBC 6.5 4.0 - 10.5 K/uL   RBC 2.88 (L) 3.87 - 5.11 MIL/uL   Hemoglobin 9.5 (L) 12.0 - 15.0 g/dL   HCT 29.4 (L) 36.0 - 46.0 %   MCV 102.1 (H) 80.0 - 100.0 fL   MCH 33.0 26.0 - 34.0 pg   MCHC 32.3 30.0 - 36.0 g/dL   RDW 18.6 (H) 11.5 - 15.5 %   Platelets 99 (L) 150 - 400 K/uL    Comment: REPEATED TO VERIFY   nRBC 0.0 0.0 - 0.2 %    Comment: Performed at [facility] [address], [place], [place] [address]  Basic metabolic panel     Status: Abnormal   Collection Time: 09/17/22  4:02 AM  Result Value Ref Range   Sodium 137 135 - 145 mmol/L   Potassium 5.4 (H) 3.5 - 5.1 mmol/L    Comment: HEMOLYSIS AT THIS LEVEL MAY AFFECT RESULT   Chloride 100 98 -  111 mmol/L   CO2 26 22 - 32 mmol/L   Glucose, Bld 90 70 - 99 mg/dL    Comment: Glucose reference range applies only to samples taken after fasting for at least 8 hours.   BUN 42 (H) 8 - 23 mg/dL   Creatinine, Ser 7.53 (H) 0.44 - 1.00 mg/dL   Calcium 10.0 8.9 - 10.3 mg/dL   GFR, Estimated 5 (L) >60 mL/min    Comment: (NOTE) Calculated using the CKD-EPI Creatinine Equation (2021)    Anion gap 11 5 - 15    Comment: Performed at [facility] [address], [place], [place] [address]  Culture, blood (Routine X 2) w Reflex to ID Panel     Status: Abnormal (Preliminary result)   Collection Time: 09/17/22   7:58 AM   Specimen: BLOOD RIGHT ARM  Result Value Ref Range   Specimen Description BLOOD RIGHT ARM    Special Requests      BOTTLES DRAWN AEROBIC AND ANAEROBIC Blood Culture adequate volume   Culture  Setup Time      GRAM POSITIVE COCCI IN CLUSTERS ANAEROBIC BOTTLE ONLY Organism ID to follow CRITICAL RESULT CALLED TO, READ BACK BY AND VERIFIED WITH: T RUDISILL,PHARMD'@0148'$  09/18/22 [place]    Culture (A)     STAPHYLOCOCCUS AUREUS CULTURE REINCUBATED FOR BETTER GROWTH Performed at [facility] [address], [place], [place] [address]    Report Status PENDING   Blood Culture ID Panel (Reflexed)     Status: Abnormal   Collection Time: 09/17/22  7:58 AM  Result Value Ref Range   Enterococcus faecalis NOT DETECTED NOT DETECTED   Enterococcus Faecium NOT DETECTED NOT DETECTED   Listeria monocytogenes NOT DETECTED NOT DETECTED   Staphylococcus species DETECTED (A) NOT DETECTED    Comment: CRITICAL RESULT CALLED TO, READ BACK BY AND VERIFIED WITH: T RUDISILL,PHARMD'@0148'$  09/18/22 [place]    Staphylococcus aureus (BCID) DETECTED (A) NOT DETECTED    Comment: Methicillin (oxacillin)-resistant Staphylococcus aureus (MRSA). MRSA is predictably resistant to beta-lactam antibiotics (except ceftaroline). Preferred therapy is vancomycin unless clinically contraindicated. Patient requires contact precautions if  hospitalized. CRITICAL RESULT CALLED TO, READ BACK BY AND VERIFIED WITH: T RUDISILL,PHARMD'@0148'$  09/18/22 [place]    Staphylococcus epidermidis NOT DETECTED NOT DETECTED   Staphylococcus lugdunensis NOT DETECTED NOT DETECTED   Streptococcus species NOT DETECTED NOT DETECTED   Streptococcus agalactiae NOT DETECTED NOT DETECTED   Streptococcus pneumoniae NOT DETECTED NOT DETECTED   Streptococcus pyogenes NOT DETECTED NOT DETECTED   A.calcoaceticus-baumannii NOT DETECTED NOT DETECTED   Bacteroides fragilis NOT DETECTED NOT DETECTED   Enterobacterales NOT DETECTED NOT DETECTED   Enterobacter  cloacae complex NOT DETECTED NOT DETECTED   Escherichia coli NOT DETECTED NOT DETECTED   Klebsiella aerogenes NOT DETECTED NOT DETECTED   Klebsiella oxytoca NOT DETECTED NOT DETECTED   Klebsiella pneumoniae NOT DETECTED NOT DETECTED   Proteus species NOT DETECTED NOT DETECTED   Salmonella species NOT DETECTED NOT DETECTED   Serratia marcescens NOT DETECTED NOT DETECTED   Haemophilus influenzae NOT DETECTED NOT DETECTED   Neisseria meningitidis NOT DETECTED NOT DETECTED   Pseudomonas aeruginosa NOT DETECTED NOT DETECTED   Stenotrophomonas maltophilia NOT DETECTED NOT DETECTED   Candida albicans NOT DETECTED NOT DETECTED   Candida auris NOT DETECTED NOT DETECTED   Candida glabrata NOT DETECTED NOT DETECTED   Candida krusei NOT DETECTED NOT DETECTED   Candida parapsilosis NOT DETECTED NOT DETECTED   Candida tropicalis NOT DETECTED NOT DETECTED   Cryptococcus  neoformans/gattii NOT DETECTED NOT DETECTED   Meth resistant mecA/C and MREJ DETECTED (A) NOT DETECTED    Comment: CRITICAL RESULT CALLED TO, READ BACK BY AND VERIFIED WITH: T RUDISILL,PHARMD'@0148'$  09/18/22 [place] Performed at [facility], [place] [address], [place], [place] [address]   Culture, blood (Routine X 2) w Reflex to ID Panel     Status: None (Preliminary result)   Collection Time: 09/17/22  8:03 AM   Specimen: BLOOD RIGHT HAND  Result Value Ref Range   Specimen Description BLOOD RIGHT HAND    Special Requests      BOTTLES DRAWN AEROBIC ONLY Blood Culture results may not be optimal due to an inadequate volume of blood received in culture bottles   Culture      NO GROWTH < 24 HOURS Performed at [facility] [address], [place], [place] [address]    Report Status PENDING   Procalcitonin - Baseline     Status: None   Collection Time: 09/17/22  9:50 AM  Result Value Ref Range   Procalcitonin 0.62 ng/mL    Comment:        Interpretation: PCT > 0.5 ng/mL and <= 2 ng/mL: Systemic infection (sepsis) is  possible, but other conditions are known to elevate PCT as well. (NOTE)       Sepsis PCT Algorithm           Lower Respiratory Tract                                      Infection PCT Algorithm    ----------------------------     ----------------------------         PCT < 0.25 ng/mL                PCT < 0.10 ng/mL          Strongly encourage             Strongly discourage   discontinuation of antibiotics    initiation of antibiotics    ----------------------------     -----------------------------       PCT 0.25 - 0.50 ng/mL            PCT 0.10 - 0.25 ng/mL               OR       >80% decrease in PCT            Discourage initiation of                                            antibiotics      Encourage discontinuation           of antibiotics    ----------------------------     -----------------------------         PCT >= 0.50 ng/mL              PCT 0.26 - 0.50 ng/mL                AND       <80% decrease in PCT             Encourage initiation of  antibiotics       Encourage continuation           of antibiotics    ----------------------------     -----------------------------        PCT >= 0.50 ng/mL                  PCT > 0.50 ng/mL               AND         increase in PCT                  Strongly encourage                                      initiation of antibiotics    Strongly encourage escalation           of antibiotics                                     -----------------------------                                           PCT <= 0.25 ng/mL                                                 OR                                        > 80% decrease in PCT                                      Discontinue / Do not initiate                                             antibiotics  Performed at [facility], [address], [place], [place] [address]   C-reactive protein     Status: Abnormal   Collection Time:  09/17/22  9:50 AM  Result Value Ref Range   CRP 19.3 (H) <1.0 mg/dL    Comment: Performed at [facility] [address], [place], [place] [address]  CBC with Differential/Platelet     Status: Abnormal   Collection Time: 09/18/22  5:46 AM  Result Value Ref Range   WBC 5.5 4.0 - 10.5 K/uL   RBC 3.17 (L) 3.87 - 5.11 MIL/uL   Hemoglobin 10.7 (L) 12.0 - 15.0 g/dL   HCT 31.4 (L) 36.0 - 46.0 %   MCV 99.1 80.0 - 100.0 fL   MCH 33.8 26.0 - 34.0 pg   MCHC 34.1 30.0 - 36.0 g/dL   RDW 18.4 (H) 11.5 - 15.5 %   Platelets 109 (L) 150 - 400 K/uL    Comment: REPEATED TO VERIFY   nRBC  0.0 0.0 - 0.2 %   Neutrophils Relative % 72 %   Neutro Abs 4.0 1.7 - 7.7 K/uL   Lymphocytes Relative 13 %   Lymphs Abs 0.7 0.7 - 4.0 K/uL   Monocytes Relative 12 %   Monocytes Absolute 0.6 0.1 - 1.0 K/uL   Eosinophils Relative 2 %   Eosinophils Absolute 0.1 0.0 - 0.5 K/uL   Basophils Relative 0 %   Basophils Absolute 0.0 0.0 - 0.1 K/uL   Immature Granulocytes 1 %   Abs Immature Granulocytes 0.03 0.00 - 0.07 K/uL    Comment: Performed at [facility] [address] [place], [place] [address]  Procalcitonin     Status: None   Collection Time: 09/18/22  5:46 AM  Result Value Ref Range   Procalcitonin 0.80 ng/mL    Comment:        Interpretation: PCT > 0.5 ng/mL and <= 2 ng/mL: Systemic infection (sepsis) is possible, but other conditions are known to elevate PCT as well. (NOTE)       Sepsis PCT Algorithm           Lower Respiratory Tract                                      Infection PCT Algorithm    ----------------------------     ----------------------------         PCT < 0.25 ng/mL                PCT < 0.10 ng/mL          Strongly encourage             Strongly discourage   discontinuation of antibiotics    initiation of antibiotics    ----------------------------     -----------------------------       PCT 0.25 - 0.50 ng/mL            PCT 0.10 - 0.25 ng/mL               OR       >80%  decrease in PCT            Discourage initiation of                                            antibiotics      Encourage discontinuation           of antibiotics    ----------------------------     -----------------------------         PCT >= 0.50 ng/mL              PCT 0.26 - 0.50 ng/mL                AND       <80% decrease in PCT             Encourage initiation of                                             antibiotics       Encourage continuation           of antibiotics    ----------------------------     -----------------------------  PCT >= 0.50 ng/mL                  PCT > 0.50 ng/mL               AND         increase in PCT                  Strongly encourage                                      initiation of antibiotics    Strongly encourage escalation           of antibiotics                                     -----------------------------                                           PCT <= 0.25 ng/mL                                                 OR                                        > 80% decrease in PCT                                      Discontinue / Do not initiate                                             antibiotics  Performed at [facility], [address] [place], [place] [address]   C-reactive protein     Status: Abnormal   Collection Time: 09/18/22  6:46 AM  Result Value Ref Range   CRP 22.3 (H) <1.0 mg/dL    Comment: Performed at [facility] [address], , [place] [address]  Renal function panel     Status: Abnormal   Collection Time: 09/18/22  7:21 AM  Result Value Ref Range   Sodium 136 135 - 145 mmol/L   Potassium 3.8 3.5 - 5.1 mmol/L   Chloride 95 (L) 98 - 111 mmol/L   CO2 27 22 - 32 mmol/L   Glucose, Bld 81 70 - 99 mg/dL    Comment: Glucose reference range applies only to samples taken after fasting for at least 8 hours.   BUN 23 8 - 23 mg/dL   Creatinine, Ser 4.72 (H) 0.44 - 1.00 mg/dL   Calcium 9.8 8.9  - 10.3 mg/dL   Phosphorus 4.6 2.5 - 4.6 mg/dL   Albumin 2.9 (L) 3.5 - 5.0 g/dL   GFR, Estimated 9 (L) >60 mL/min    Comment: (NOTE) Calculated using the CKD-EPI Creatinine Equation (2021)  Anion gap 14 5 - 15    Comment: Performed at [facility] [address], [place], [place] [address]    MR LUMBAR SPINE WO CONTRAST  Result Date: 09/17/2022 CLINICAL DATA:  Epidural abscess suspect EXAM: MRI LUMBAR SPINE WITHOUT CONTRAST TECHNIQUE: Multiplanar, multisequence MR imaging of the lumbar spine was performed. No intravenous contrast was administered. COMPARISON:  MRI L spine 07/26/22, CT L Spine 09/16/22 FINDINGS: Segmentation: In keeping with prior numbering convention the last well-formed disc space is labeled L5-S1. Alignment:  There is mild anterolisthesis of L3 on L4. Vertebrae: Redemonstrated are multiple compression deformities in the lumbar spine, including at the L2, L3, L4, and L5 levels. These compression deformities appear grossly unchanged compared to prior lumbar spine MRI dated 07/26/2022. On CT of the L-spine obtained on 09/16/2022 there is irregular endplate erosion, best seen at the L4-L5 level. Surrounding these compression deformities is extensive T2 hyperintense signal abnormality in the paraspinal soft tissues with extension into the bilateral psoas musculature (series 7, images 24-32). Abnormal T2 signal is also seen along the ventral margin (series 7, image 20) of the vertebral bodies. Compared to prior exam from August, the degree of soft tissue abnormality has increased, best seen at the L2-L3 disc space level. Compared to prior exam the amount of T2 heterogeneous soft tissue within the ventral epidural space at this level has also increased (series 7,image 25) now resulting in moderate to severe spinal canal stenosis, previously mild-to-moderate. There is also increased abnormal soft tissue in the ventral epidural space along the posterior margin of the L3 vertebral body  (series 2, image 8) with persistent moderate to severe spinal canal narrowing at the L3-L4 disc space level. There is persistent severe spinal canal narrowing at the L5-S1 level secondary to a central disc protrusion. Conus medullaris and cauda equina: Conus extends to the L1 level. Conus and cauda equina appear normal. IMPRESSION: 1. Findings are concerning for progression of discitis/osteomyelitis with increased soft tissue extent of infection in the paraspinal and epidural spaces. Compared to prior exam from 07/26/2022, there is increased abnormal soft tissue in the ventral epidural space at the L2-L3 and L3-L4 disc space levels resulting in moderate to severe spinal canal narrowing at these levels. There is also increased signal abnormality in the surrounding paraspinal soft tissues and bilateral psoas musculature. 2. Persistent severe spinal canal narrowing at the L5-S1 level secondary to a central disc protrusion. 3. Stable compression deformities of the L2, L3, L4, and L5 vertebral bodies with irregular endplate erosion at the L4-L5 level, best seen on CT of the L-spine obtained on 09/16/2022. Electronically Signed   By: Marin Roberts M.D.   On: 09/17/2022 16:26   US THYROID  Result Date: 09/17/2022 CLINICAL DATA:  Nodule EXAM: THYROID ULTRASOUND TECHNIQUE: Ultrasound examination of the thyroid gland and adjacent soft tissues was performed. COMPARISON:  None Available. FINDINGS: Parenchymal Echotexture: Moderately heterogenous Isthmus: 0.5 cm thickness Right lobe: 3.6 x 1.6 x 1.3 cm Left lobe: 3.5 x 2 x 2 cm _________________________________________________________ Estimated total number of nodules >/= 1 cm: 2 Number of spongiform nodules >/=  2 cm not described below (TR1): 0 Number of mixed cystic and solid nodules >/= 1.5 cm not described below (TR2): 0 _________________________________________________________ Nodule # 1: Location: Right; mid Maximum size: 1.2 cm; Other 2 dimensions: 0.8 x 1.2 cm  Composition: solid/almost completely solid (2) Echogenicity: hypoechoic (2) Shape: not taller-than-wide (0) Margins: smooth (0) Echogenic foci: none (0) ACR TI-RADS total points: 4.  ACR TI-RADS risk category: TR 4. ACR TI-RADS recommendations: *Given size (>/= 1 - 1.4 cm) and appearance, a follow-up ultrasound in 1 year should be considered based on TI-RADS criteria. _________________________________________________________ Nodule # 2: Location: Left; mid Maximum size: 1.4 cm; Other 2 dimensions: 1.1 x 1.2 cm Composition: solid/almost completely solid (2) Echogenicity: hypoechoic (2) Shape: not taller-than-wide (0) Margins: smooth (0) Echogenic foci: peripheral calcifications (2) ACR TI-RADS total points: 6. ACR TI-RADS risk category: TR 4. ACR TI-RADS recommendations: *Given size (>/= 1 - 1.4 cm) and appearance, a follow-up ultrasound in 1 year should be considered based on TI-RADS criteria. _________________________________________________________ No regional cervical adenopathy. IMPRESSION: 1. Normal-sized thyroid with bilateral nodules. Neither meets criteria for biopsy. Recommend annual/biennial ultrasound follow-up as above, until stability x5 years confirmed. The above is in keeping with the ACR TI-RADS recommendations - J Am Coll Radiol 2017;14:587-595. Electronically Signed   By: Lucrezia Europe M.D.   On: 09/17/2022 12:07   CT Lumbar Spine Wo Contrast  Result Date: 09/17/2022 CLINICAL DATA:  Lumbar compression fracture EXAM: CT LUMBAR SPINE WITHOUT CONTRAST TECHNIQUE: Multidetector CT imaging of the lumbar spine was performed without intravenous contrast administration. Multiplanar CT image reconstructions were also generated. RADIATION DOSE REDUCTION: This exam was performed according to the departmental dose-optimization program which includes automated exposure control, adjustment of the mA and/or kV according to patient size and/or use of iterative reconstruction technique. COMPARISON:  07/25/2022  FINDINGS: Segmentation: Transitional L5 vertebrae with bilateral assimilation joints. Alignment: Grade 1 anterolisthesis at L3-4 Vertebrae: Diffuse osteopenia. There is no acute compression fracture. There is redemonstration of a nonacute L3 compression fracture with an osteonecrotic cleft beneath the superior endplate which has expanded slightly but is otherwise unchanged. Advanced degenerative disc disease with endplate remodeling at W9-7 is unchanged. Paraspinal and other soft tissues: Calcific aortic atherosclerosis Disc levels: L1-2: Mild disc bulge.  Without high-grade stenosis L2-3: Unchanged retropulsion of posterosuperior corner with moderate spinal canal stenosis. L3-4: Unchanged severe spinal canal stenosis due to combination of ligamentum flavum bulk and large disc bulge. L4-5: Moderate disc bulge with endplate spurring. Unchanged mild spinal canal stenosis. L5-S1: Transitional level IMPRESSION: 1. No acute compression fracture. 2. Unchanged nonacute L3 compression fracture with an osteonecrotic cleft beneath the superior endplate 3. Unchanged severe spinal canal stenosis at L3-4 and moderate spinal canal stenosis at L2-3. Aortic Atherosclerosis (ICD10-I70.0). Electronically Signed   By: Ulyses Jarred M.D.   On: 09/17/2022 00:04   CT Cervical Spine Wo Contrast  Result Date: 09/16/2022 CLINICAL DATA:  Polytrauma, blunt Level 2 fall EXAM: CT HEAD WITHOUT CONTRAST CT CERVICAL SPINE WITHOUT CONTRAST TECHNIQUE: Multidetector CT imaging of the head and cervical spine was performed following the standard protocol without intravenous contrast. Multiplanar CT image reconstructions of the cervical spine were also generated. RADIATION DOSE REDUCTION: This exam was performed according to the departmental dose-optimization program which includes automated exposure control, adjustment of the mA and/or kV according to patient size and/or use of iterative reconstruction technique. COMPARISON:  MR thoracic spine  07/26/2022 FINDINGS: CT HEAD FINDINGS BRAIN: BRAIN Cerebral ventricle sizes are concordant with the degree of cerebral volume loss. Patchy and confluent areas of decreased attenuation are noted throughout the deep and periventricular white matter of the cerebral hemispheres bilaterally, compatible with chronic microvascular ischemic disease. No evidence of large-territorial acute infarction. No parenchymal hemorrhage. No mass lesion. No extra-axial collection. No mass effect or midline shift. No hydrocephalus. Basilar cisterns are patent. Empty sella. Vascular: No hyperdense vessel. Atherosclerotic calcifications are present  within the cavernous internal carotid arteries. Skull: No acute fracture or focal lesion. Sinuses/Orbits: Paranasal sinuses and mastoid air cells are clear. The orbits are unremarkable. Other: None. CT CERVICAL SPINE FINDINGS Alignment: Similar-appearing retrolisthesis of C5 on C6. Skull base and vertebrae: Similar-appearing severe degenerative changes of the C5-C6 level with anterior wedge deformity of the C5 level. Posterior disc osteophyte complex formation at the C3-C4 and C4-C5 level. No severe osseous neural foraminal or central canal stenosis. No acute fracture. No aggressive appearing focal osseous lesion or focal pathologic process. Soft tissues and spinal canal: No prevertebral fluid or swelling. No visible canal hematoma. Upper chest: Unremarkable. Other: Severe atherosclerotic plaque of the visualized thoracic aorta. A 1.5 cm calcified hypodensity within the left thyroid gland. IMPRESSION: 1.  No acute intracranial abnormality. 2. No acute displaced fracture or traumatic listhesis of the cervical spine in a patient with chronic severe C5-C6 degenerative changes. 3. Empty sella. Findings is often a normal anatomic variant but can be associated with idiopathic intracranial hypertension (pseudotumor cerebri). 4. A 1.5 cm calcified hypodensity within the left thyroid gland. Recommend  thyroid US (ref: J Am Coll Radiol. 2015 Feb;12(2): 143-50). Electronically Signed   By: Iven Finn M.D.   On: 09/16/2022 23:17   CT HEAD WO CONTRAST (5MM)  Result Date: 09/16/2022 CLINICAL DATA:  Polytrauma, blunt Level 2 fall EXAM: CT HEAD WITHOUT CONTRAST CT CERVICAL SPINE WITHOUT CONTRAST TECHNIQUE: Multidetector CT imaging of the head and cervical spine was performed following the standard protocol without intravenous contrast. Multiplanar CT image reconstructions of the cervical spine were also generated. RADIATION DOSE REDUCTION: This exam was performed according to the departmental dose-optimization program which includes automated exposure control, adjustment of the mA and/or kV according to patient size and/or use of iterative reconstruction technique. COMPARISON:  MR thoracic spine 07/26/2022 FINDINGS: CT HEAD FINDINGS BRAIN: BRAIN Cerebral ventricle sizes are concordant with the degree of cerebral volume loss. Patchy and confluent areas of decreased attenuation are noted throughout the deep and periventricular white matter of the cerebral hemispheres bilaterally, compatible with chronic microvascular ischemic disease. No evidence of large-territorial acute infarction. No parenchymal hemorrhage. No mass lesion. No extra-axial collection. No mass effect or midline shift. No hydrocephalus. Basilar cisterns are patent. Empty sella. Vascular: No hyperdense vessel. Atherosclerotic calcifications are present within the cavernous internal carotid arteries. Skull: No acute fracture or focal lesion. Sinuses/Orbits: Paranasal sinuses and mastoid air cells are clear. The orbits are unremarkable. Other: None. CT CERVICAL SPINE FINDINGS Alignment: Similar-appearing retrolisthesis of C5 on C6. Skull base and vertebrae: Similar-appearing severe degenerative changes of the C5-C6 level with anterior wedge deformity of the C5 level. Posterior disc osteophyte complex formation at the C3-C4 and C4-C5 level. No  severe osseous neural foraminal or central canal stenosis. No acute fracture. No aggressive appearing focal osseous lesion or focal pathologic process. Soft tissues and spinal canal: No prevertebral fluid or swelling. No visible canal hematoma. Upper chest: Unremarkable. Other: Severe atherosclerotic plaque of the visualized thoracic aorta. A 1.5 cm calcified hypodensity within the left thyroid gland. IMPRESSION: 1.  No acute intracranial abnormality. 2. No acute displaced fracture or traumatic listhesis of the cervical spine in a patient with chronic severe C5-C6 degenerative changes. 3. Empty sella. Findings is often a normal anatomic variant but can be associated with idiopathic intracranial hypertension (pseudotumor cerebri). 4. A 1.5 cm calcified hypodensity within the left thyroid gland. Recommend thyroid US (ref: J Am Coll Radiol. 2015 Feb;12(2): 143-50). Electronically Signed   By: Thomasena Edis  Mckinley Jewel M.D.   On: 09/16/2022 23:17   DG Pelvis Portable  Result Date: 09/16/2022 CLINICAL DATA:  Level 2 trauma, fall. History of fracture 2 lower back from years ago. EXAM: PORTABLE PELVIS 1-2 VIEWS COMPARISON:  07/07/2022. FINDINGS: Examination is limited due to patient rotation. The sacrum is not well evaluated on exam due to overlying bowel gas. No obvious acute fracture or dislocation. There is stable bony deformity of the inferior pubic ramus on the left, which may be due to old trauma. IMPRESSION: No acute fracture or dislocation. Electronically Signed   By: Brett Fairy M.D.   On: 09/16/2022 22:52   DG Chest Port 1 View  Result Date: 09/16/2022 CLINICAL DATA:  Level 2 trauma, fall. History of fracture and lower back for years ago. EXAM: PORTABLE CHEST 1 VIEW COMPARISON:  None Available. FINDINGS: The heart size and mediastinal contours are within normal limits. There is atherosclerotic calcification of the aorta. No consolidation, effusion, or pneumothorax. No acute osseous abnormality is seen.  IMPRESSION: No active disease. Electronically Signed   By: Brett Fairy M.D.   On: 09/16/2022 22:46   DG Wrist Complete Right  Result Date: 09/16/2022 CLINICAL DATA:  Level 2 trauma, fall. EXAM: RIGHT WRIST - COMPLETE 3+ VIEW COMPARISON:  01/16/2021. FINDINGS: There is no evidence of fracture or dislocation. Mild degenerative changes are present at the first carpometacarpal joint and wrist. Vascular calcifications are noted in the soft tissues. There is chondrocalcinosis with calcification at the TFCC. IMPRESSION: No acute fracture or dislocation. Electronically Signed   By: Brett Fairy M.D.   On: 09/16/2022 22:45    Review of Systems  Unable to perform ROS: Mental acuity   Blood pressure (!) 171/101, pulse 84, temperature 98 F (36.7 C), temperature source Oral, resp. rate 18, height '5\' 6"'$  (1.676 m), weight 50 kg, SpO2 100 %. Estimated body mass index is 17.79 kg/m as calculated from the following:   Height as of this encounter: '5\' 6"'$  (1.676 m).   Weight as of this encounter: 50 kg.  Physical Exam Constitutional:      Appearance: She is ill-appearing.  HENT:     Head: Normocephalic and atraumatic.     Mouth/Throat:     Mouth: Mucous membranes are dry.  Eyes:     Extraocular Movements: Extraocular movements intact.     Conjunctiva/sclera: Conjunctivae normal.     Pupils: Pupils are equal, round, and reactive to light.  Cardiovascular:     Comments: Atrial flutter Pulmonary:     Effort: Pulmonary effort is normal.  Abdominal:     General: Abdomen is flat.     Palpations: Abdomen is soft.  Musculoskeletal:        General: Normal range of motion.     Cervical back: Normal range of motion and neck supple.     Comments: Generalized weakness 4/5 BUE, BLE  Skin:    General: Skin is dry.     Capillary Refill: Capillary refill takes less than 2 seconds.  Neurological:     Mental Status: She is alert. She is disoriented.     Cranial Nerves: Cranial nerves 2-12 are intact.      Sensory: Sensation is intact.     Motor: Weakness present.     Comments: Patient oriented to self and month/year, disoriented to location and situation  Psychiatric:        Mood and Affect: Mood normal.     Assessment/Plan: Patient with discitis/osteomyelitis, significant chronic lumbar stenosis, and stable compression  deformities. The patient's strength appears to be at her baseline. She denies back pain at present and is moving all of her extremities. Surgery would only remove a very small fraction of this patient's overall infection and would not likely improve her situation. Recommend continued treatment with antibiotic therapy and mobilization with PT and OT.     Viona Gilmore, DNP, AGNP-C Nurse Practitioner  [facility] [place] [address], [address], [place],  [address] P: [phone]    F: [phone]  09/18/2022, 9:53 AM

## 2022-09-18 NOTE — Progress Notes (Signed)
   Blood cx 10/13 1/4 bottles GPC, MRSA per [facility], On Vancomycin already    MRI L spine 09/17/22   IMPRESSION: 1. Findings are concerning for progression of discitis/osteomyelitis with increased soft tissue extent of infection in the paraspinal and epidural spaces. Compared to prior exam from 07/26/2022, there is increased abnormal soft tissue in the ventral epidural space at the L2-L3 and L3-L4 disc space levels resulting in moderate to severe spinal canal narrowing at these levels. There is also increased signal abnormality in the surrounding paraspinal soft tissues and bilateral psoas musculature. 2. Persistent severe spinal canal narrowing at the L5-S1 level secondary to a central disc protrusion. 3. Stable compression deformities of the L2, L3, L4, and L5 vertebral bodies with irregular endplate erosion at the L4-L5 level, best seen on CT of the L-spine obtained on 09/16/2022.  Plan Continue Vancomcycin, pharmacy to dose.  Neurosurgery consult given abnormality in MRI L spine as above I will repeat 2 sets of blood cultures for 10/15 TTE ordered ti begin D/w DR Horris Latino Following  Rosiland Oz, MD Infectious Disease Physician [facility] [address] [place], [name] [address] Phone: [phone]  Fax: [phone]

## 2022-09-19 DIAGNOSIS — I160 Hypertensive urgency: Secondary | ICD-10-CM | POA: Diagnosis not present

## 2022-09-19 DIAGNOSIS — N186 End stage renal disease: Secondary | ICD-10-CM | POA: Diagnosis not present

## 2022-09-19 DIAGNOSIS — I4892 Unspecified atrial flutter: Secondary | ICD-10-CM | POA: Diagnosis not present

## 2022-09-19 DIAGNOSIS — G9341 Metabolic encephalopathy: Secondary | ICD-10-CM | POA: Diagnosis not present

## 2022-09-19 LAB — CBC WITH DIFFERENTIAL/PLATELET
Abs Immature Granulocytes: 0.01 10*3/uL (ref 0.00–0.07)
Basophils Absolute: 0 10*3/uL (ref 0.0–0.1)
Basophils Relative: 0 %
Eosinophils Absolute: 0.1 10*3/uL (ref 0.0–0.5)
Eosinophils Relative: 1 %
HCT: 26.3 % — ABNORMAL LOW (ref 36.0–46.0)
Hemoglobin: 8.8 g/dL — ABNORMAL LOW (ref 12.0–15.0)
Immature Granulocytes: 0 %
Lymphocytes Relative: 10 %
Lymphs Abs: 0.5 10*3/uL — ABNORMAL LOW (ref 0.7–4.0)
MCH: 33.7 pg (ref 26.0–34.0)
MCHC: 33.5 g/dL (ref 30.0–36.0)
MCV: 100.8 fL — ABNORMAL HIGH (ref 80.0–100.0)
Monocytes Absolute: 0.7 10*3/uL (ref 0.1–1.0)
Monocytes Relative: 13 %
Neutro Abs: 3.8 10*3/uL (ref 1.7–7.7)
Neutrophils Relative %: 76 %
Platelets: 120 10*3/uL — ABNORMAL LOW (ref 150–400)
RBC: 2.61 MIL/uL — ABNORMAL LOW (ref 3.87–5.11)
RDW: 18 % — ABNORMAL HIGH (ref 11.5–15.5)
WBC: 5 10*3/uL (ref 4.0–10.5)
nRBC: 0 % (ref 0.0–0.2)

## 2022-09-19 LAB — RENAL FUNCTION PANEL
Albumin: 2.4 g/dL — ABNORMAL LOW (ref 3.5–5.0)
Anion gap: 12 (ref 5–15)
BUN: 16 mg/dL (ref 8–23)
CO2: 26 mmol/L (ref 22–32)
Calcium: 9.8 mg/dL (ref 8.9–10.3)
Chloride: 96 mmol/L — ABNORMAL LOW (ref 98–111)
Creatinine, Ser: 3.56 mg/dL — ABNORMAL HIGH (ref 0.44–1.00)
GFR, Estimated: 13 mL/min — ABNORMAL LOW (ref >60)
Glucose, Bld: 94 mg/dL (ref 70–99)
Phosphorus: 4.6 mg/dL (ref 2.5–4.6)
Potassium: 3.7 mmol/L (ref 3.5–5.1)
Sodium: 134 mmol/L — ABNORMAL LOW (ref 135–145)

## 2022-09-19 LAB — PROCALCITONIN: Procalcitonin: 0.81 ng/mL

## 2022-09-19 MED ORDER — CLONIDINE HCL 0.1 MG PO TABS
0.2000 mg | ORAL_TABLET | ORAL | Status: DC | PRN
  Administered 2022-09-19: 0.2 mg via ORAL
  Filled 2022-09-19: qty 2

## 2022-09-19 MED ORDER — HYDROCORTISONE 1 % EX CREA
TOPICAL_CREAM | Freq: Three times a day (TID) | CUTANEOUS | Status: DC
  Administered 2022-09-19 – 2022-09-24 (×4): 1 via TOPICAL
  Filled 2022-09-19: qty 28

## 2022-09-19 MED ORDER — ORAL CARE MOUTH RINSE: 15.0000 mL | OROMUCOSAL | Status: DC | PRN

## 2022-09-19 NOTE — Progress Notes (Addendum)
Subjective: In room lethargic, OT and PT in room, she does awaken with stimulation verbal and touch from [place].  Denies any back discomfort currently, tolerated HD yesterday on schedule  Objective Vital signs in last 24 hours: Vitals:   09/19/22 0438 09/19/22 0540 09/19/22 0659 09/19/22 0700  BP: (!) 155/133 (!) 203/115 (!) 125/102 106/72  Pulse:    98  Resp:    11  Temp:    98.6 F (37 C)  TempSrc:    Axillary  SpO2:    96%  Weight:      Height:       Weight change:   Physical Exam: General: Thin elderly female NAD this a.m. lethargic, pleasantly confused Heart: RRR, no MRG Lungs: Clear anteriorly nonlabored breathing Abdomen: NABS soft, NTND no ascites Extremities: No pedal edema Dialysis Access: L UA  AVF + bruit    Home meds include - albuterol, apixaban, advair, hydralazine 100 2-3 x per day, metoprolol 50 bid, pantoprazole, sevelamer carb 2 ac tid, prns/ vits/ supps    OP HD: [place] TTS 4h   400/600  53.5kg  2/2 bath  Hep none LUA AVF - last HD 10/10 post wt 54.1kg - last Hb 10.1 - mircera 100 q2, new start  - iron sucrose 50 mg weekly - doxercalciferol 3 ug iv tts      109/100, HR 90,  RR 15  97%    K+ 5.4  BUN 42  cr 7.53   Problem/Plan Acute metabolic encephalopathy -multiple etiology MRSA bacteremia with vertebral discitis/osteomyelitis, dementia, atrial flutter, hypertension , gabapentin,  1 missed HD Tx /. Had not really missed any HD sessions, she got her HD x 3 last week and this week had HD on Tuesday, only session missed  10/12. No sig azotemia.  This a.m. pleasant confusion and lethargy. ESRD - on HD TTS.  Usually good compliance.  Missed on Thursday had hemodialysis yesterday =Friday HD and again today to get back on schedule.  HTN /vol -a.m. BP 150/95, looks a bit dry, keep even on HD. Get wts. BP meds, hydralazine 100 mg bid (hd days ) tid non hd , metoprolol 50 mg bid requiring IV Cardizem in setting of acute A-fib 10/13. Progression of  discitis/osteomyelitis L-spine on MRI on 09/17/2022/= ID consulting noted [facility] 10/13 1 of 4 bottles GPC, MRSA per [facility] ID on vancomycin, TTE ordered by ID and neurosurgery to eval repeat blood cultures 10/15 ordered/NS consulted noted no surgery needed currently Rapid A-fib IV Cardizem drip started by admit team 10/13 evening.  Sinus rhythm on telemetry this a.m. 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 will start esa which was the plan at OP HD. Fe IV weekly will hold in setting of infection MBD ckd - CCa elevated, will hold IV vdra. Cont renvela as binder.  Phos 4.6  Ernest Haber, PA-C [facility] [phone] 09/19/2022,10:48 AM  LOS: 2 days   Labs: Basic Metabolic Panel: Recent Labs  Lab 09/17/22 0402 09/18/22 0721 09/19/22 0749  NA 137 136 134*  K 5.4* 3.8 3.7  CL 100 95* 96*  CO2 '26 27 26  '$ GLUCOSE 90 81 94  BUN 42* 23 16  CREATININE 7.53* 4.72* 3.56*  CALCIUM 10.0 9.8 9.8  PHOS  --  4.6 4.6   Liver Function Tests: Recent Labs  Lab 09/15/22 0449 09/16/22 2242 09/18/22 0721 09/19/22 0749  AST 14 14*  --   --   ALT 8 10  --   --  ALKPHOS  --  40  --   --   BILITOT 0.5 0.7  --   --   PROT 7.0 6.5  --   --   ALBUMIN  --  2.9* 2.9* 2.4*   No results for input(s): "LIPASE", "AMYLASE" in the last 168 hours. Recent Labs  Lab 09/17/22 0134  AMMONIA 24   CBC: Recent Labs  Lab 09/15/22 0449 09/16/22 2242 09/16/22 2248 09/17/22 0402 09/18/22 0546 09/19/22 0749  WBC 4.7 5.5  --  6.5 5.5 5.0  NEUTROABS 3,201  --   --   --  4.0 3.8  HGB 11.1* 10.0*   < > 9.5* 10.7* 8.8*  HCT 34.3* 31.6*   < > 29.4* 31.4* 26.3*  MCV 102.4* 104.6*  --  102.1* 99.1 100.8*  PLT 129* 100*  --  99* 109* 120*   < > = values in this interval not displayed.   Cardiac Enzymes: No results for input(s): "CKTOTAL", "CKMB", "CKMBINDEX", "TROPONINI" in the last 168 hours. CBG: No results for input(s): "GLUCAP" in the last 168 hours.    diltiazem (CARDIZEM) infusion Stopped  (09/18/22 1933)   vancomycin 500 mg (09/18/22 1550)    apixaban  2.5 mg Oral BID   Chlorhexidine Gluconate Cloth  6 each Topical Q0600   hydrALAZINE  100 mg Oral 2 times per day on Tue Thu Sat   hydrALAZINE  100 mg Oral 3 times per day on Sun Mon Wed Fri   hydrocortisone cream   Topical TID   metoprolol tartrate  50 mg Oral BID   mometasone-formoterol  2 puff Inhalation BID   pantoprazole  40 mg Oral BID   sevelamer carbonate  1,600 mg Oral TID WC   vancomycin variable dose per unstable renal function (pharmacist dosing)   Does not apply See admin instructions

## 2022-09-19 NOTE — Evaluation (Signed)
Occupational Therapy Evaluation Patient Details Name: Diamond [name] MRN: [id number] DOB: October 26, 1951 Today's Date: 09/19/2022   History of Present Illness The pt is a 71 yo female presenting 10/12 with AMS after missing x2 HD treatments and sustaining a mechanical fall at home. Pt found to have HTN emergency, and MRI showed progression of discitis/osteomyelitis at L2-3 and L3-4 with stable compression fx of L2, L3, L4, and L5. Neurosurgery recommends medical management, non-op. PMH includes: anemia, asthma, atrial flutter, ESRD on HD, gout, and HTN.   Clinical Impression   Pt lethargic on entry and not answering questions regarding PLOF or following commands. No family present to provide baseline information. Pt presenting with decreased mobility, strength, ROM, cognition, and activity tolerance. Due to lethargy, pt requiring total A for all mobility and ADL at this time. Pt lith limited engagement will continue to assess and address functional limitations during acute length of stay as level of arousal improves. Recommending discharge at [facility] for continued rehab following discharge.      Recommendations for follow up therapy are one component of a multi-disciplinary discharge planning process, led by the attending physician.  Recommendations may be updated based on patient status, additional functional criteria and insurance authorization.   Follow Up Recommendations  Skilled nursing-short term rehab (<3 hours/day)    Assistance Recommended at Discharge Frequent or constant Supervision/Assistance  Patient can return home with the following A lot of help with walking and/or transfers;A lot of help with bathing/dressing/bathroom;Assistance with cooking/housework;Direct supervision/assist for medications management;Help with stairs or ramp for entrance;Assist for transportation;Direct supervision/assist for financial management    Functional Status Assessment  Patient has had a recent decline in  their functional status and demonstrates the ability to make significant improvements in function in a reasonable and predictable amount of time.  Equipment Recommendations  Other (comment) (defer to next venue)    Recommendations for Other Services       Precautions / Restrictions Precautions Precautions: Fall Other Brace: Spinal precautions for comfort Restrictions Weight Bearing Restrictions: No      Mobility Bed Mobility Overal bed mobility: Needs Assistance Bed Mobility: Sidelying to Sit, Sit to Sidelying, Rolling Rolling: Total assist Sidelying to sit: Total assist, +2 for physical assistance     Sit to sidelying: Total assist, +2 for physical assistance General bed mobility comments: No active attempts to assist    Transfers Overall transfer level: Needs assistance Equipment used: 2 person hand held assist Transfers: Sit to/from Stand Sit to Stand: Total assist, +2 physical assistance           General transfer comment: No attempts to achieve fully upright      Balance Overall balance assessment: Needs assistance Sitting-balance support: Bilateral upper extremity supported, Feet supported Sitting balance-Leahy Scale: Poor Sitting balance - Comments: Min guard A -Max A. Postural control: Right lateral lean   Standing balance-Leahy Scale: Zero Standing balance comment: total                           ADL either performed or assessed with clinical judgement   ADL Overall ADL's : Needs assistance/impaired Eating/Feeding: Total assistance   Grooming: Total assistance   Upper Body Bathing: Total assistance   Lower Body Bathing: Total assistance   Upper Body Dressing : Total assistance   Lower Body Dressing: Total assistance   Toilet Transfer: Total assistance   Toileting- Clothing Manipulation and Hygiene: Total assistance   Tub/ Shower Transfer: Total assistance  Functional mobility during ADLs: Total assistance General ADL  Comments: Requiring total A for all ADL at this time due to level of arousal. Pt was observed to scratch face with LUE during session.     Vision   Additional Comments: Will further assess when pt is alert     Perception     Praxis      Pertinent Vitals/Pain Pain Assessment Pain Assessment: Faces Faces Pain Scale: No hurt Pain Intervention(s): Monitored during session     Hand Dominance     Extremity/Trunk Assessment Upper Extremity Assessment Upper Extremity Assessment: Generalized weakness;RUE deficits/detail;LUE deficits/detail RUE Deficits / Details: not observed to actively move during session. Pt with flexed position at rest, but no incresaed tone noted. No increased tone noted on modified ashworth scale. PROM overall WFL LUE Deficits / Details: Min tone through ~76% range on modified ashworth. PROM WFL, and observed to use to scratch fae during session   Lower Extremity Assessment Lower Extremity Assessment: Defer to PT evaluation   Cervical / Trunk Assessment Cervical / Trunk Assessment: Kyphotic   Communication     Cognition Arousal/Alertness: Lethargic Behavior During Therapy: Flat affect Overall Cognitive Status: Difficult to assess                                 General Comments: Pt with ~3 verbalizations presenting as groans, coupled with head shake "no" when asked if she wants food or drink. Pt otherwise non-verbal and very lethargic this session. Arousing to calm and eye opened state, but not alert, not following commands, no eye contact. Pt with ~2 episodes of very brief eye contact when name stated. No actiecvement of fully atlert state despite sitting EOB and standing with total A.     General Comments  VSS on RA    Exercises Exercises: Other exercises Other Exercises Other Exercises: lateral SCM stretch towards R   Shoulder Instructions      Home Living                                   Additional Comments: Pt  unable to state      Prior Functioning/Environment Prior Level of Function : Patient poor historian/Family not available               ADLs Comments: Unable to report        OT Problem List: Decreased strength;Decreased range of motion;Decreased activity tolerance;Impaired balance (sitting and/or standing);Decreased cognition;Decreased safety awareness;Decreased knowledge of use of DME or AE      OT Treatment/Interventions: Self-care/ADL training;Therapeutic exercise;DME and/or AE instruction;Energy conservation;Therapeutic activities;Patient/family education;Balance training;Cognitive remediation/compensation    OT Goals(Current goals can be found in the care plan section) Acute Rehab OT Goals OT Goal Formulation: With patient Time For Goal Achievement: 10/03/22 Potential to Achieve Goals: Good  OT Frequency: Min 2X/week    Co-evaluation              AM-PAC OT "6 Clicks" Daily Activity     Outcome Measure Help from another person eating meals?: Total Help from another person taking care of personal grooming?: Total Help from another person toileting, which includes using toliet, bedpan, or urinal?: Total Help from another person bathing (including washing, rinsing, drying)?: Total Help from another person to put on and taking off regular upper body clothing?: Total Help from another person to put on  and taking off regular lower body clothing?: Total 6 Click Score: 6   End of Session Equipment Utilized During Treatment: Gait belt Nurse Communication: Mobility status;Other (comment) (level of arousal)  Activity Tolerance: Patient limited by lethargy Patient left: in bed;with call bell/phone within reach;with bed alarm set  OT Visit Diagnosis: Unsteadiness on feet (R26.81);Other abnormalities of gait and mobility (R26.89);Muscle weakness (generalized) (M62.81);Other symptoms and signs involving cognitive function                Time: 1444-5848 OT Time Calculation  (min): 25 min Charges:  OT General Charges $OT Visit: 1 Visit OT Evaluation $OT Eval Moderate Complexity: 1 Mod  Shanda Howells, OTR/L [facility] Acute Rehabilitation Office: [phone]   Lula Olszewski 09/19/2022, 10:18 AM

## 2022-09-19 NOTE — Evaluation (Signed)
Physical Therapy Evaluation Patient Details Name: Diamond [name] MRN: [id number] DOB: [birth year] Today's Date: 09/19/2022  History of Present Illness  The pt is a 71 yo female presenting 10/12 with AMS after missing x2 HD treatments and sustaining a mechanical fall at home. Pt found to have HTN emergency, and MRI showed progression of discitis/osteomyelitis at L2-3 and L3-4 with stable compression fx of L2, L3, L4, and L5. Neurosurgery recommends medical management, non-op. PMH includes: anemia, asthma, atrial flutter, ESRD on HD, gout, and HTN.   Clinical Impression  Pt in bed upon arrival of PT, lethargic and not answering questions regarding PLOF or home set up at time of evaluation. No family present to provide information. The pt now presents with limitations in functional mobility, strength, ROM, cognition, and stability due to above dx, and will continue to benefit from skilled PT to address these deficits. Due to lethargy, the pt required totalA to complete all bed mobility and attempts at sit-stand transfer. The pt did not engage to assist with seated balance (leaning to R) or with BLE once assisted to standing position. Will continue to attempt with improved arousal, at this time, pt needs significant physical assist for any mobility and would benefit from SNF rehab after d/c. Will continue to assess as improved cognition and arousal.      Recommendations for follow up therapy are one component of a multi-disciplinary discharge planning process, led by the attending physician.  Recommendations may be updated based on patient status, additional functional criteria and insurance authorization.  Follow Up Recommendations Skilled nursing-short term rehab (<3 hours/day) Can patient physically be transported by private vehicle: No    Assistance Recommended at Discharge Frequent or constant Supervision/Assistance  Patient can return home with the following  Two people to help with walking  and/or transfers;Two people to help with bathing/dressing/bathroom;Assistance with cooking/housework;Assistance with feeding;Direct supervision/assist for medications management;Direct supervision/assist for financial management;Assist for transportation;Help with stairs or ramp for entrance    Equipment Recommendations Other (comment)  Recommendations for Other Services       Functional Status Assessment Patient has had a recent decline in their functional status and demonstrates the ability to make significant improvements in function in a reasonable and predictable amount of time.     Precautions / Restrictions Precautions Precautions: Fall Other Brace: Spinal precautions for comfort Restrictions Weight Bearing Restrictions: No      Mobility  Bed Mobility Overal bed mobility: Needs Assistance Bed Mobility: Sidelying to Sit, Sit to Sidelying, Rolling Rolling: Total assist Sidelying to sit: Total assist, +2 for physical assistance     Sit to sidelying: Total assist, +2 for physical assistance General bed mobility comments: No active attempts to assist    Transfers Overall transfer level: Needs assistance Equipment used: 2 person hand held assist Transfers: Sit to/from Stand Sit to Stand: Total assist, +2 physical assistance           General transfer comment: No attempts to achieve fully upright from pt, assist at knees to block, hips to extend and trunk to reduce forwards flexion.    Ambulation/Gait               General Gait Details: unable at this time     Balance Overall balance assessment: Needs assistance Sitting-balance support: Bilateral upper extremity supported, Feet supported Sitting balance-Leahy Scale: Poor Sitting balance - Comments: Min guard A -Max A. Postural control: Right lateral lean   Standing balance-Leahy Scale: Zero Standing balance comment: total  Pertinent Vitals/Pain Pain  Assessment Pain Assessment: Faces Faces Pain Scale: Hurts a little bit Pain Location: neck with PROM Pain Descriptors / Indicators: Grimacing Pain Intervention(s): Limited activity within patient's tolerance, Monitored during session, Repositioned    Home Living                     Additional Comments: Pt unable to state    Prior Function Prior Level of Function : Patient poor historian/Family not available               ADLs Comments: Unable to report     Hand Dominance   Dominant Hand: Right    Extremity/Trunk Assessment   Upper Extremity Assessment Upper Extremity Assessment: Defer to OT evaluation RUE Deficits / Details: not observed to actively move during session. Pt with flexed position at rest, but no incresaed tone noted. No increased tone noted on modified ashworth scale. PROM overall WFL LUE Deficits / Details: Min tone through ~76% range on modified ashworth. PROM WFL, and observed to use to scratch fae during session    Lower Extremity Assessment Lower Extremity Assessment: Generalized weakness;Difficult to assess due to impaired cognition (sustained clonus RLE, 3-5 beats clonus in LLE. pt not making attempt to follow commands for strength testing at this time)    Cervical / Trunk Assessment Cervical / Trunk Assessment: Kyphotic  Communication   Communication:  (limited by lethargy, pt only grunting at eval)  Cognition Arousal/Alertness: Lethargic Behavior During Therapy: Flat affect Overall Cognitive Status: Difficult to assess                                 General Comments: Pt with ~3 verbalizations presenting as groans, coupled with head shake "no" when asked if she wants food or drink. Pt otherwise non-verbal and very lethargic this session. Arousing to calm and eye opened state, but not alert, not following commands, no eye contact. Pt with ~2 episodes of very brief eye contact when name stated. No actiecvement of fully atlert  state despite sitting EOB and standing with total A.        General Comments General comments (skin integrity, edema, etc.): VSS on RA        Assessment/Plan    PT Assessment Patient needs continued PT services  PT Problem List Decreased strength;Decreased activity tolerance;Decreased balance;Decreased mobility;Decreased coordination;Decreased cognition;Decreased knowledge of use of DME;Decreased safety awareness;Decreased knowledge of precautions;Cardiopulmonary status limiting activity       PT Treatment Interventions DME instruction;Balance training;Gait training;Neuromuscular re-education;Stair training;Cognitive remediation;Functional mobility training;Patient/family education;Wheelchair mobility training;Therapeutic activities;Therapeutic exercise    PT Goals (Current goals can be found in the Care Plan section)  Acute Rehab PT Goals Patient Stated Goal: did not state PT Goal Formulation: With patient/family Time For Goal Achievement: 10/03/22 Potential to Achieve Goals: Fair    Frequency Min 2X/week     Co-evaluation PT/OT/SLP Co-Evaluation/Treatment: Yes Reason for Co-Treatment: Complexity of the patient's impairments (multi-system involvement);Necessary to address cognition/behavior during functional activity;For patient/therapist safety;To address functional/ADL transfers PT goals addressed during session: Mobility/safety with mobility;Balance;Strengthening/ROM         AM-PAC PT "6 Clicks" Mobility  Outcome Measure Help needed turning from your back to your side while in a flat bed without using bedrails?: Total Help needed moving from lying on your back to sitting on the side of a flat bed without using bedrails?: Total Help needed moving to and from a bed  to a chair (including a wheelchair)?: Total Help needed standing up from a chair using your arms (e.g., wheelchair or bedside chair)?: Total Help needed to walk in hospital room?: Total Help needed climbing  3-5 steps with a railing? : Total 6 Click Score: 6    End of Session Equipment Utilized During Treatment: Gait belt Activity Tolerance: Patient tolerated treatment well Patient left: in bed;with call bell/phone within reach;with bed alarm set Nurse Communication: Mobility status PT Visit Diagnosis: Unsteadiness on feet (R26.81);Other abnormalities of gait and mobility (R26.89);Muscle weakness (generalized) (M62.81)    Time: 6742-5525 PT Time Calculation (min) (ACUTE ONLY): 25 min   Charges:   PT Evaluation $PT Eval Moderate Complexity: 1 Mod          [place], PT, DPT   Acute Rehabilitation Department  Sandra Cockayne 09/19/2022, 2:27 PM

## 2022-09-19 NOTE — Progress Notes (Signed)
Pt becoming increasingly more agitated, paranoid, and mistrustful throughout the night. States "I want to go home." Tried to reorient the pt and she stated that she didn't believe anything I was telling her. Pt calling out "help me" loudly over and over whenever this nurse leaves the room. Will continue to try to reorient the pt.

## 2022-09-19 NOTE — Progress Notes (Signed)
IR received request to evaluate patient for possible psoas abscess aspiration. Imaging reviewed by Dr. Serafina Royals who states there is nothing IR can drain. Ordering provider made aware. No procedure planned and the order will be deleted.   Soyla Dryer, [place] [phone] 09/19/2022, 12:51 PM

## 2022-09-19 NOTE — Progress Notes (Signed)
PROGRESS NOTE  Diamond [name] JSE:[id number] DOB: [birth year] DOA: 09/16/2022 PCP: [name], MD   HPI/Recap of past 24 hours: Diamond [name] is a 71 y.o. female with medical history significant of ESRD on HD, PAF on eliquis, HTN, h/o recurrent MRSA bacteremia and vertebral discitis, presents the ED following a fall at home and some acute on chronic back pain.  Patient also noted to be intermittently confused, reported missing a dialysis session due to back pain as per patient.  In the ED, work-up for any traumatic injury was negative including CT head/spine.  While in the ED, patient noted to spike a temp, blood cultures were taken and patient was started on IV vancomycin and cefepime.  Of note, patient recently finished 4 weeks of IV ABx (looks like vanc) 8/8.  Readmitted 8/22 for acute on chronic back pain with MRI concerning for l2-3 paravertebral edema and still elevated crp.  Treated with another 4 weeks of Bactrim PO.  Due to history, ID was consulted, as well as nephrology for HD.  Patient admitted for further management.    Today, met patient sleeping in bed, easily arousable.  Overnight, noted to be confused.       Assessment/Plan: Principal Problem:   Acute metabolic encephalopathy Active Problems:   Hypertensive urgency   ESRD on hemodialysis (HCC)   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    Paroxysmal atrial flutter (HCC)   Thyroid nodule   Empty sella (HCC)   Acute on chronic back pain MRSA bacteremia and vertebral discitis Had a temp spike, with no leukocytosis BC x2 growing MRSA Procalcitonin 0.62, CRP elevated UA negative Chest x-ray unremarkable Echo pending CT lumbar spine without contrast unremarkable for any fracture or collection ID consulted due to history, recommend MRI lumbar spine for further evaluation MRI lumbar spine showed progression of discitis with increased soft tissue extent of infection in the paraspinal and  epidural spaces. Neurosurgery/IR consulted no further work-up recommended Continue vancomycin  Monitor closely PT/OT, fall precautions  Hypertensive urgency Cont home PO hydralazine, lopressor, clonidine as needed  Acute metabolic encephalopathy Improving May be due to HTN encephalopathy vs infection Management as above   ESRD on hemodialysis ([place]) 1 missed dialysis session Nephrology on board, plan for HD on 10/13 and 10/14 to get back on schedule  Paroxysmal atrial flutter (HCC) Heart rate controlled Noted to be in A-fib with RVR on 09/17/2022, s/p Cardizem drip Cont home metoprolol Cont home eliquis   History of MRSA bacteremia and discitis  Pt finished 4 weeks IV Abx, followed by 4 more weeks of bactrim for discitis Followed by ID as an outpatient   Empty sella ([place]) Seen on CT Pt denies headache, recent visual changes Monitor   Thyroid nodule Thyroid ultrasound showed normal-sized thyroid with bilateral nodules, does not meet any criteria for biopsy      Estimated body mass index is 18.65 kg/m as calculated from the following:   Height as of this encounter: '5\' 6"'  (1.676 m).   Weight as of this encounter: 52.4 kg.     Code Status: Full  Family Communication: None at bedside  Disposition Plan: Status is: Inpatient Remains inpatient appropriate because: Level of care      Consultants: Nephrology ID  Procedures: None  Antimicrobials: Vancomycin  DVT prophylaxis: Eliquis   Objective: Vitals:   09/19/22 0700 09/19/22 1059 09/19/22 1100 09/19/22 1626  BP: 106/72 (!) 147/94 (!) 156/97 (!) 162/94  Pulse: 98 78 78 78  Resp: '11  15 14  ' Temp: 98.6 F (37 C)  97.9 F (36.6 C) 98 F (36.7 C)  TempSrc: Axillary  Axillary Axillary  SpO2: 96%  100% 100%  Weight:      Height:       No intake or output data in the 24 hours ending 09/19/22 1646  Filed Weights   09/16/22 2228 09/18/22 1435 09/18/22 1657  Weight: 50 kg 52.4 kg 52.4 kg     Exam: General: NAD, appears dry, lethargic, cachectic, awake/alert/oriented Cardiovascular: S1, S2 present Respiratory: CTAB Abdomen: Soft, nontender, nondistended, bowel sounds present Musculoskeletal: No bilateral pedal edema noted Skin: Dry Psychiatry: Normal mood  Neurology: No obvious focal neurologic deficits noted    Data Reviewed: CBC: Recent Labs  Lab 09/15/22 0449 09/16/22 2242 09/16/22 2248 09/17/22 0402 09/18/22 0546 09/19/22 0749  WBC 4.7 5.5  --  6.5 5.5 5.0  NEUTROABS 3,201  --   --   --  4.0 3.8  HGB 11.1* 10.0* 11.2* 9.5* 10.7* 8.8*  HCT 34.3* 31.6* 33.0* 29.4* 31.4* 26.3*  MCV 102.4* 104.6*  --  102.1* 99.1 100.8*  PLT 129* 100*  --  99* 109* 500*   Basic Metabolic Panel: Recent Labs  Lab 09/15/22 0449 09/16/22 2242 09/16/22 2248 09/17/22 0402 09/18/22 0721 09/19/22 0749  NA 139 138 136 137 136 134*  K 4.4 4.3 4.3 5.4* 3.8 3.7  CL 97* 99 100 100 95* 96*  CO2 30 25  --  '26 27 26  ' GLUCOSE 102* 96 88 90 81 94  BUN 31* 39* 36* 42* 23 16  CREATININE 5.28* 7.28* 7.30* 7.53* 4.72* 3.56*  CALCIUM 10.2 10.2  --  10.0 9.8 9.8  PHOS  --   --   --   --  4.6 4.6   GFR: Estimated Creatinine Clearance: 11.8 mL/min (A) (by C-G formula based on SCr of 3.56 mg/dL (H)). Liver Function Tests: Recent Labs  Lab 09/15/22 0449 09/16/22 2242 09/18/22 0721 09/19/22 0749  AST 14 14*  --   --   ALT 8 10  --   --   ALKPHOS  --  40  --   --   BILITOT 0.5 0.7  --   --   PROT 7.0 6.5  --   --   ALBUMIN  --  2.9* 2.9* 2.4*   No results for input(s): "LIPASE", "AMYLASE" in the last 168 hours. Recent Labs  Lab 09/17/22 0134  AMMONIA 24   Coagulation Profile: Recent Labs  Lab 09/16/22 2242  INR 1.1   Cardiac Enzymes: No results for input(s): "CKTOTAL", "CKMB", "CKMBINDEX", "TROPONINI" in the last 168 hours. BNP (last 3 results) No results for input(s): "PROBNP" in the last 8760 hours. HbA1C: No results for input(s): "HGBA1C" in the last 72  hours. CBG: No results for input(s): "GLUCAP" in the last 168 hours. Lipid Profile: No results for input(s): "CHOL", "HDL", "LDLCALC", "TRIG", "CHOLHDL", "LDLDIRECT" in the last 72 hours. Thyroid Function Tests: No results for input(s): "TSH", "T4TOTAL", "FREET4", "T3FREE", "THYROIDAB" in the last 72 hours. Anemia Panel: No results for input(s): "VITAMINB12", "FOLATE", "FERRITIN", "TIBC", "IRON", "RETICCTPCT" in the last 72 hours. Urine analysis:    Component Value Date/Time   COLORURINE YELLOW 09/17/2022 0137   APPEARANCEUR CLOUDY (A) 09/17/2022 0137   LABSPEC 1.008 09/17/2022 0137   PHURINE 9.0 (H) 09/17/2022 [place] 09/17/2022 [place] 09/17/2022 [place] 09/17/2022 [place] 09/17/2022 0137  PROTEINUR 100 (A) 09/17/2022 0137   UROBILINOGEN 0.2 09/27/2021 1329   NITRITE NEGATIVE 09/17/2022 0137   LEUKOCYTESUR NEGATIVE 09/17/2022 0137   Sepsis Labs: '@LABRCNTIP' (procalcitonin:4,lacticidven:4)  ) Recent Results (from the past 240 hour(s))  Culture, blood (Routine X 2) w Reflex to ID Panel     Status: Abnormal (Preliminary result)   Collection Time: 09/17/22  7:58 AM   Specimen: BLOOD RIGHT ARM  Result Value Ref Range Status   Specimen Description BLOOD RIGHT ARM  Final   Special Requests   Final    BOTTLES DRAWN AEROBIC AND ANAEROBIC Blood Culture adequate volume   Culture  Setup Time   Final    GRAM POSITIVE COCCI IN CLUSTERS ANAEROBIC BOTTLE ONLY Organism ID to follow CRITICAL RESULT CALLED TO, READ BACK BY AND VERIFIED WITH: T RUDISILL,PHARMD'@0148'  09/18/22 [place]    Culture (A)  Final    STAPHYLOCOCCUS AUREUS SUSCEPTIBILITIES TO FOLLOW Performed at [facility], [place] [address] [place], [place] [address]    Report Status PENDING  Incomplete  Blood Culture ID Panel (Reflexed)     Status: Abnormal   Collection Time: 09/17/22  7:58 AM  Result Value Ref Range Status   Enterococcus faecalis NOT  DETECTED NOT DETECTED Final   Enterococcus Faecium NOT DETECTED NOT DETECTED Final   Listeria monocytogenes NOT DETECTED NOT DETECTED Final   Staphylococcus species DETECTED (A) NOT DETECTED Final    Comment: CRITICAL RESULT CALLED TO, READ BACK BY AND VERIFIED WITH: T RUDISILL,PHARMD'@0148'  09/18/22 [place]    Staphylococcus aureus (BCID) DETECTED (A) NOT DETECTED Final    Comment: Methicillin (oxacillin)-resistant Staphylococcus aureus (MRSA). MRSA is predictably resistant to beta-lactam antibiotics (except ceftaroline). Preferred therapy is vancomycin unless clinically contraindicated. Patient requires contact precautions if  hospitalized. CRITICAL RESULT CALLED TO, READ BACK BY AND VERIFIED WITH: T RUDISILL,PHARMD'@0148'  09/18/22 [place]    Staphylococcus epidermidis NOT DETECTED NOT DETECTED Final   Staphylococcus lugdunensis NOT DETECTED NOT DETECTED Final   Streptococcus species NOT DETECTED NOT DETECTED Final   Streptococcus agalactiae NOT DETECTED NOT DETECTED Final   Streptococcus pneumoniae NOT DETECTED NOT DETECTED Final   Streptococcus pyogenes NOT DETECTED NOT DETECTED Final   A.calcoaceticus-baumannii NOT DETECTED NOT DETECTED Final   Bacteroides fragilis NOT DETECTED NOT DETECTED Final   Enterobacterales NOT DETECTED NOT DETECTED Final   Enterobacter cloacae complex NOT DETECTED NOT DETECTED Final   Escherichia coli NOT DETECTED NOT DETECTED Final   Klebsiella aerogenes NOT DETECTED NOT DETECTED Final   Klebsiella oxytoca NOT DETECTED NOT DETECTED Final   Klebsiella pneumoniae NOT DETECTED NOT DETECTED Final   Proteus species NOT DETECTED NOT DETECTED Final   Salmonella species NOT DETECTED NOT DETECTED Final   Serratia marcescens NOT DETECTED NOT DETECTED Final   Haemophilus influenzae NOT DETECTED NOT DETECTED Final   Neisseria meningitidis NOT DETECTED NOT DETECTED Final   Pseudomonas aeruginosa NOT DETECTED NOT DETECTED Final   Stenotrophomonas maltophilia NOT DETECTED NOT  DETECTED Final   Candida albicans NOT DETECTED NOT DETECTED Final   Candida auris NOT DETECTED NOT DETECTED Final   Candida glabrata NOT DETECTED NOT DETECTED Final   Candida krusei NOT DETECTED NOT DETECTED Final   Candida parapsilosis NOT DETECTED NOT DETECTED Final   Candida tropicalis NOT DETECTED NOT DETECTED Final   Cryptococcus neoformans/gattii NOT DETECTED NOT DETECTED Final   Meth resistant mecA/C and MREJ DETECTED (A) NOT DETECTED Final    Comment: CRITICAL RESULT CALLED TO, READ BACK BY AND VERIFIED WITH: T RUDISILL,PHARMD'@0148'  09/18/22 [place] Performed  at [facility], [place] [address], [place], [place] [address]   Culture, blood (Routine X 2) w Reflex to ID Panel     Status: None (Preliminary result)   Collection Time: 09/17/22  8:03 AM   Specimen: BLOOD RIGHT HAND  Result Value Ref Range Status   Specimen Description BLOOD RIGHT HAND  Final   Special Requests   Final    BOTTLES DRAWN AEROBIC ONLY Blood Culture results may not be optimal due to an inadequate volume of blood received in culture bottles   Culture   Final    NO GROWTH < 24 HOURS Performed at [facility], [place] [address], [place], [place] [address]    Report Status PENDING  Incomplete      Studies: No results found.  Scheduled Meds:  apixaban  2.5 mg Oral BID   Chlorhexidine Gluconate Cloth  6 each Topical Q0600   hydrALAZINE  100 mg Oral 2 times per day on Tue Thu Sat   hydrALAZINE  100 mg Oral 3 times per day on Sun Mon Wed Fri   hydrocortisone cream   Topical TID   metoprolol tartrate  50 mg Oral BID   mometasone-formoterol  2 puff Inhalation BID   pantoprazole  40 mg Oral BID   sevelamer carbonate  1,600 mg Oral TID WC   vancomycin variable dose per unstable renal function (pharmacist dosing)   Does not apply See admin instructions    Continuous Infusions:  diltiazem (CARDIZEM) infusion Stopped (09/18/22 1933)   vancomycin 500 mg (09/18/22 1550)     LOS: 2 days     Alma Friendly, MD [facility]  If 7PM-7AM, please contact night-coverage [email or web address] 09/19/2022, 4:46 PM

## 2022-09-19 NOTE — Progress Notes (Signed)
ID Brief Note  No neurosurgical intervention per Neurosurgery  TTE is pending  Repeat blood cx on 10/15 ordered   IR evaluation to see if any benefit for drainage ( not for diagnostic purpose)  in the psoas musculature, Will follow recs  Rosiland Oz, MD Infectious Disease Physician [facility] [address] [place], [place] [address] Phone: [phone]  Fax: [phone]

## 2022-09-20 ENCOUNTER — Other Ambulatory Visit (HOSPITAL_COMMUNITY): Payer: Medicare HMO

## 2022-09-20 ENCOUNTER — Inpatient Hospital Stay (HOSPITAL_COMMUNITY): Payer: Medicare HMO

## 2022-09-20 DIAGNOSIS — N186 End stage renal disease: Secondary | ICD-10-CM | POA: Diagnosis not present

## 2022-09-20 DIAGNOSIS — G9341 Metabolic encephalopathy: Secondary | ICD-10-CM | POA: Diagnosis not present

## 2022-09-20 DIAGNOSIS — I4892 Unspecified atrial flutter: Secondary | ICD-10-CM | POA: Diagnosis not present

## 2022-09-20 DIAGNOSIS — I160 Hypertensive urgency: Secondary | ICD-10-CM | POA: Diagnosis not present

## 2022-09-20 LAB — CULTURE, BLOOD (ROUTINE X 2): Special Requests: ADEQUATE

## 2022-09-20 LAB — RENAL FUNCTION PANEL
Albumin: 2.3 g/dL — ABNORMAL LOW (ref 3.5–5.0)
Anion gap: 11 (ref 5–15)
BUN: 27 mg/dL — ABNORMAL HIGH (ref 8–23)
CO2: 27 mmol/L (ref 22–32)
Calcium: 9.8 mg/dL (ref 8.9–10.3)
Chloride: 97 mmol/L — ABNORMAL LOW (ref 98–111)
Creatinine, Ser: 5.15 mg/dL — ABNORMAL HIGH (ref 0.44–1.00)
GFR, Estimated: 8 mL/min — ABNORMAL LOW (ref >60)
Glucose, Bld: 95 mg/dL (ref 70–99)
Phosphorus: 5 mg/dL — ABNORMAL HIGH (ref 2.5–4.6)
Potassium: 3.8 mmol/L (ref 3.5–5.1)
Sodium: 135 mmol/L (ref 135–145)

## 2022-09-20 LAB — CBC WITH DIFFERENTIAL/PLATELET
Abs Immature Granulocytes: 0.02 10*3/uL (ref 0.00–0.07)
Basophils Absolute: 0 10*3/uL (ref 0.0–0.1)
Basophils Relative: 1 %
Eosinophils Absolute: 0.1 10*3/uL (ref 0.0–0.5)
Eosinophils Relative: 4 %
HCT: 26.1 % — ABNORMAL LOW (ref 36.0–46.0)
Hemoglobin: 9 g/dL — ABNORMAL LOW (ref 12.0–15.0)
Immature Granulocytes: 1 %
Lymphocytes Relative: 22 %
Lymphs Abs: 0.9 10*3/uL (ref 0.7–4.0)
MCH: 33.7 pg (ref 26.0–34.0)
MCHC: 34.5 g/dL (ref 30.0–36.0)
MCV: 97.8 fL (ref 80.0–100.0)
Monocytes Absolute: 0.5 10*3/uL (ref 0.1–1.0)
Monocytes Relative: 13 %
Neutro Abs: 2.3 10*3/uL (ref 1.7–7.7)
Neutrophils Relative %: 59 %
Platelets: 133 10*3/uL — ABNORMAL LOW (ref 150–400)
RBC: 2.67 MIL/uL — ABNORMAL LOW (ref 3.87–5.11)
RDW: 17.8 % — ABNORMAL HIGH (ref 11.5–15.5)
WBC: 3.9 10*3/uL — ABNORMAL LOW (ref 4.0–10.5)
nRBC: 0 % (ref 0.0–0.2)

## 2022-09-20 MED ORDER — CHLORHEXIDINE GLUCONATE CLOTH 2 % EX PADS
6.0000 | MEDICATED_PAD | Freq: Every day | CUTANEOUS | Status: DC
  Administered 2022-09-21 – 2022-09-24 (×4): 6 via TOPICAL

## 2022-09-20 MED ORDER — CAMPHOR-MENTHOL 0.5-0.5 % EX LOTN
TOPICAL_LOTION | CUTANEOUS | Status: DC | PRN
  Filled 2022-09-20: qty 222

## 2022-09-20 MED ORDER — HALOPERIDOL LACTATE 5 MG/ML IJ SOLN
5.0000 mg | Freq: Once | INTRAMUSCULAR | Status: AC
  Administered 2022-09-20: 5 mg via INTRAVENOUS
  Filled 2022-09-20: qty 1

## 2022-09-20 MED ORDER — HYDROMORPHONE HCL 1 MG/ML IJ SOLN
0.5000 mg | Freq: Once | INTRAMUSCULAR | Status: AC
  Administered 2022-09-20: 0.5 mg via INTRAVENOUS
  Filled 2022-09-20: qty 0.5

## 2022-09-20 MED ORDER — DARBEPOETIN ALFA 100 MCG/0.5ML IJ SOSY
100.0000 ug | PREFILLED_SYRINGE | INTRAMUSCULAR | Status: DC
  Administered 2022-09-21: 100 ug via INTRAVENOUS
  Filled 2022-09-20: qty 0.5

## 2022-09-20 NOTE — Care Management Important Message (Signed)
Important Message  Patient Details  Name: Diamond [name] MRN: [id number] Date of Birth: [birth year]   Medicare Important Message Given:  Yes  Patient has a contact  precautions order in place will mail the IM to the patient home address.     Jourdin Gens 09/20/2022, 3:14 PM

## 2022-09-20 NOTE — Plan of Care (Signed)

## 2022-09-20 NOTE — Progress Notes (Signed)
Subjective: The patient is alert and pleasant.  She is in no apparent distress.  She wants to go home.  Objective: Vital signs in last 24 hours: Temp:  [97.8 F (36.6 C)-98.4 F (36.9 C)] 98.4 F (36.9 C) (10/16 1107) Pulse Rate:  [79-86] 79 (10/16 1107) Resp:  [16-18] 18 (10/16 1107) BP: (113-170)/(76-106) 113/76 (10/16 1107) SpO2:  [98 %-100 %] 98 % (10/16 0346) Estimated body mass index is 18.65 kg/m as calculated from the following:   Height as of this encounter: '5\' 6"'$  (1.676 m).   Weight as of this encounter: 52.4 kg.   Intake/Output from previous day: No intake/output data recorded. Intake/Output this shift: Total I/O In: 240 [P.O.:240] Out: -   Physical exam the patient is alert and pleasant.  Her lower extremity strength is grossly normal.  Lab Results: Recent Labs    09/19/22 0749 09/20/22 0435  WBC 5.0 3.9*  HGB 8.8* 9.0*  HCT 26.3* 26.1*  PLT 120* 133*   BMET Recent Labs    09/19/22 0749 09/20/22 0435  NA 134* 135  K 3.7 3.8  CL 96* 97*  CO2 26 27  GLUCOSE 94 95  BUN 16 27*  CREATININE 3.56* 5.15*  CALCIUM 9.8 9.8    Studies/Results: No results found.  Assessment/Plan: Lumbar osteomyelitis, discitis, epidural abscess, stenosis: I discussed the situation with the patient.  Presently she has no neurologic deficits and therefore I do not think surgery would be beneficial to remove a small amount of her infection.  I recommend continued treatment with antibiotics.  Please have her follow-up with Dr. Annette [name] on the office.  I will sign off.  Please call if I can be of further assistance.  LOS: 3 days     Diamond [name] 09/20/2022, 5:00 PM     Patient ID: Diamond [name], female   DOB: [birth year], 71 y.o.   MRN: [id number]

## 2022-09-20 NOTE — Progress Notes (Signed)
PROGRESS NOTE  Diamond [name] HER:[id number] DOB: [birth year] DOA: 09/16/2022 PCP: [name], MD   HPI/Recap of past 24 hours: Diamond [name] is a 71 y.o. female with medical history significant of ESRD on HD, PAF on eliquis, HTN, h/o recurrent MRSA bacteremia and vertebral discitis, presents the ED following a fall at home and some acute on chronic back pain.  Patient also noted to be intermittently confused, reported missing a dialysis session due to back pain as per patient.  In the ED, work-up for any traumatic injury was negative including CT head/spine.  While in the ED, patient noted to spike a temp, blood cultures were taken and patient was started on IV vancomycin and cefepime.  Of note, patient recently finished 4 weeks of IV ABx (looks like vanc) 8/8.  Readmitted 8/22 for acute on chronic back pain with MRI concerning for l2-3 paravertebral edema and still elevated crp.  Treated with another 4 weeks of Bactrim PO.  Due to history, ID was consulted, as well as nephrology for HD.  Patient admitted for further management.    Today, pt appears to be a little agitated. No c/o back pain this am, now this afternoon, is c/o significant back pain. Refused ECHO.       Assessment/Plan: Principal Problem:   Acute metabolic encephalopathy Active Problems:   Hypertensive urgency   ESRD on hemodialysis (HCC)   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    Paroxysmal atrial flutter (HCC)   Thyroid nodule   Empty sella (HCC)   Acute on chronic back pain MRSA bacteremia and vertebral discitis Had a temp spike, with no leukocytosis BC x2 growing MRSA, repeat pending Procalcitonin 0.62, CRP elevated UA negative Chest x-ray unremarkable Echo pending, patient refused CT lumbar spine without contrast unremarkable for any fracture or collection MRI lumbar spine showed progression of discitis with increased soft tissue extent of infection in the paraspinal and  epidural spaces ID consulted, plan for 8 weeks of IV Vancomycin with HD Neurosurgery/IR consulted no further work-up recommended Continue vancomycin  Pain management PT/OT, fall precautions  Hypertensive urgency Cont home PO hydralazine, lopressor, clonidine as needed  Acute metabolic encephalopathy Improving May be due to HTN encephalopathy vs infection Management as above   ESRD on hemodialysis ([place]) 1 missed dialysis session Nephrology on board, plan for HD on 10/13 and 10/14 to get back on schedule  Paroxysmal atrial flutter (HCC) Heart rate controlled Noted to be in A-fib with RVR on 09/17/2022, s/p Cardizem drip Cont home metoprolol Cont home eliquis   History of MRSA bacteremia and discitis  Pt finished 4 weeks IV Abx, followed by 4 more weeks of bactrim for discitis Followed by ID as an outpatient   Empty sella ([place]) Seen on CT Pt denies headache, recent visual changes Monitor   Thyroid nodule Thyroid ultrasound showed normal-sized thyroid with bilateral nodules, does not meet any criteria for biopsy      Estimated body mass index is 18.65 kg/m as calculated from the following:   Height as of this encounter: '5\' 6"'$  (1.676 m).   Weight as of this encounter: 52.4 kg.     Code Status: Full  Family Communication: None at bedside  Disposition Plan: Status is: Inpatient Remains inpatient appropriate because: Level of care      Consultants: Nephrology ID  Procedures: None  Antimicrobials: Vancomycin  DVT prophylaxis: Eliquis   Objective: Vitals:   09/20/22 0346 09/20/22 0902 09/20/22 0939 09/20/22 1107  BP: Marland Kitchen)  130/90 (!) 170/98 (!) 149/106 113/76  Pulse: 79   79  Resp: 17   18  Temp: 98.3 F (36.8 C)   98.4 F (36.9 C)  TempSrc: Oral   Oral  SpO2: 98%     Weight:      Height:        Intake/Output Summary (Last 24 hours) at 09/20/2022 1604 Last data filed at 09/20/2022 1300 Gross per 24 hour  Intake 240 ml  Output --  Net 240  ml    Filed Weights   09/16/22 2228 09/18/22 1435 09/18/22 1657  Weight: 50 kg 52.4 kg 52.4 kg    Exam: General: NAD, appears dry, lethargic, cachectic, awake/alert/oriented Cardiovascular: S1, S2 present Respiratory: CTAB Abdomen: Soft, nontender, nondistended, bowel sounds present Musculoskeletal: No bilateral pedal edema noted Skin: Dry Psychiatry: Normal mood  Neurology: No obvious focal neurologic deficits noted    Data Reviewed: CBC: Recent Labs  Lab 09/15/22 0449 09/16/22 2242 09/16/22 2248 09/17/22 0402 09/18/22 0546 09/19/22 0749 09/20/22 0435  WBC 4.7 5.5  --  6.5 5.5 5.0 3.9*  NEUTROABS 3,201  --   --   --  4.0 3.8 2.3  HGB 11.1* 10.0* 11.2* 9.5* 10.7* 8.8* 9.0*  HCT 34.3* 31.6* 33.0* 29.4* 31.4* 26.3* 26.1*  MCV 102.4* 104.6*  --  102.1* 99.1 100.8* 97.8  PLT 129* 100*  --  99* 109* 120* 867*   Basic Metabolic Panel: Recent Labs  Lab 09/16/22 2242 09/16/22 2248 09/17/22 0402 09/18/22 0721 09/19/22 0749 09/20/22 0435  NA 138 136 137 136 134* 135  K 4.3 4.3 5.4* 3.8 3.7 3.8  CL 99 100 100 95* 96* 97*  CO2 25  --  '26 27 26 27  '$ GLUCOSE 96 88 90 81 94 95  BUN 39* 36* 42* 23 16 27*  CREATININE 7.28* 7.30* 7.53* 4.72* 3.56* 5.15*  CALCIUM 10.2  --  10.0 9.8 9.8 9.8  PHOS  --   --   --  4.6 4.6 5.0*   GFR: Estimated Creatinine Clearance: 8.2 mL/min (A) (by C-G formula based on SCr of 5.15 mg/dL (H)). Liver Function Tests: Recent Labs  Lab 09/15/22 0449 09/16/22 2242 09/18/22 0721 09/19/22 0749 09/20/22 0435  AST 14 14*  --   --   --   ALT 8 10  --   --   --   ALKPHOS  --  40  --   --   --   BILITOT 0.5 0.7  --   --   --   PROT 7.0 6.5  --   --   --   ALBUMIN  --  2.9* 2.9* 2.4* 2.3*   No results for input(s): "LIPASE", "AMYLASE" in the last 168 hours. Recent Labs  Lab 09/17/22 0134  AMMONIA 24   Coagulation Profile: Recent Labs  Lab 09/16/22 2242  INR 1.1   Cardiac Enzymes: No results for input(s): "CKTOTAL", "CKMB",  "CKMBINDEX", "TROPONINI" in the last 168 hours. BNP (last 3 results) No results for input(s): "PROBNP" in the last 8760 hours. HbA1C: No results for input(s): "HGBA1C" in the last 72 hours. CBG: No results for input(s): "GLUCAP" in the last 168 hours. Lipid Profile: No results for input(s): "CHOL", "HDL", "LDLCALC", "TRIG", "CHOLHDL", "LDLDIRECT" in the last 72 hours. Thyroid Function Tests: No results for input(s): "TSH", "T4TOTAL", "FREET4", "T3FREE", "THYROIDAB" in the last 72 hours. Anemia Panel: No results for input(s): "VITAMINB12", "FOLATE", "FERRITIN", "TIBC", "IRON", "RETICCTPCT" in the last 72 hours. Urine analysis:  Component Value Date/Time   COLORURINE YELLOW 09/17/2022 0137   APPEARANCEUR CLOUDY (A) 09/17/2022 0137   LABSPEC 1.008 09/17/2022 0137   PHURINE 9.0 (H) 09/17/2022 0137   GLUCOSEU NEGATIVE 09/17/2022 0137   HGBUR NEGATIVE 09/17/2022 0137   BILIRUBINUR NEGATIVE 09/17/2022 0137   KETONESUR NEGATIVE 09/17/2022 0137   PROTEINUR 100 (A) 09/17/2022 0137   UROBILINOGEN 0.2 09/27/2021 1329   NITRITE NEGATIVE 09/17/2022 0137   LEUKOCYTESUR NEGATIVE 09/17/2022 0137   Sepsis Labs: '@LABRCNTIP'$ (procalcitonin:4,lacticidven:4)  ) Recent Results (from the past 240 hour(s))  Culture, blood (Routine X 2) w Reflex to ID Panel     Status: Abnormal   Collection Time: 09/17/22  7:58 AM   Specimen: BLOOD RIGHT ARM  Result Value Ref Range Status   Specimen Description BLOOD RIGHT ARM  Final   Special Requests   Final    BOTTLES DRAWN AEROBIC AND ANAEROBIC Blood Culture adequate volume   Culture  Setup Time   Final    GRAM POSITIVE COCCI IN CLUSTERS ANAEROBIC BOTTLE ONLY CRITICAL RESULT CALLED TO, READ BACK BY AND VERIFIED WITH: T RUDISILL,PHARMD'@0148'$  09/18/22 [place] Performed at [facility], [place] [address], [place], [place] [address]    Culture METHICILLIN RESISTANT STAPHYLOCOCCUS AUREUS (A)  Final   Report Status 09/20/2022 FINAL  Final   Organism ID, Bacteria  METHICILLIN RESISTANT STAPHYLOCOCCUS AUREUS  Final      Susceptibility   Methicillin resistant staphylococcus aureus - MIC*    CIPROFLOXACIN <=0.5 SENSITIVE Sensitive     ERYTHROMYCIN <=0.25 SENSITIVE Sensitive     GENTAMICIN <=0.5 SENSITIVE Sensitive     OXACILLIN RESISTANT Resistant     TETRACYCLINE >=16 RESISTANT Resistant     VANCOMYCIN 1 SENSITIVE Sensitive     TRIMETH/SULFA <=10 SENSITIVE Sensitive     CLINDAMYCIN <=0.25 SENSITIVE Sensitive     RIFAMPIN <=0.5 SENSITIVE Sensitive     Inducible Clindamycin NEGATIVE Sensitive     * METHICILLIN RESISTANT STAPHYLOCOCCUS AUREUS  Blood Culture ID Panel (Reflexed)     Status: Abnormal   Collection Time: 09/17/22  7:58 AM  Result Value Ref Range Status   Enterococcus faecalis NOT DETECTED NOT DETECTED Final   Enterococcus Faecium NOT DETECTED NOT DETECTED Final   Listeria monocytogenes NOT DETECTED NOT DETECTED Final   Staphylococcus species DETECTED (A) NOT DETECTED Final    Comment: CRITICAL RESULT CALLED TO, READ BACK BY AND VERIFIED WITH: T RUDISILL,PHARMD'@0148'$  09/18/22 [place]    Staphylococcus aureus (BCID) DETECTED (A) NOT DETECTED Final    Comment: Methicillin (oxacillin)-resistant Staphylococcus aureus (MRSA). MRSA is predictably resistant to beta-lactam antibiotics (except ceftaroline). Preferred therapy is vancomycin unless clinically contraindicated. Patient requires contact precautions if  hospitalized. CRITICAL RESULT CALLED TO, READ BACK BY AND VERIFIED WITH: T RUDISILL,PHARMD'@0148'$  09/18/22 [place]    Staphylococcus epidermidis NOT DETECTED NOT DETECTED Final   Staphylococcus lugdunensis NOT DETECTED NOT DETECTED Final   Streptococcus species NOT DETECTED NOT DETECTED Final   Streptococcus agalactiae NOT DETECTED NOT DETECTED Final   Streptococcus pneumoniae NOT DETECTED NOT DETECTED Final   Streptococcus pyogenes NOT DETECTED NOT DETECTED Final   A.calcoaceticus-baumannii NOT DETECTED NOT DETECTED Final   Bacteroides fragilis  NOT DETECTED NOT DETECTED Final   Enterobacterales NOT DETECTED NOT DETECTED Final   Enterobacter cloacae complex NOT DETECTED NOT DETECTED Final   Escherichia coli NOT DETECTED NOT DETECTED Final   Klebsiella aerogenes NOT DETECTED NOT DETECTED Final   Klebsiella oxytoca NOT DETECTED NOT DETECTED Final   Klebsiella pneumoniae NOT DETECTED NOT DETECTED Final  Proteus species NOT DETECTED NOT DETECTED Final   Salmonella species NOT DETECTED NOT DETECTED Final   Serratia marcescens NOT DETECTED NOT DETECTED Final   Haemophilus influenzae NOT DETECTED NOT DETECTED Final   Neisseria meningitidis NOT DETECTED NOT DETECTED Final   Pseudomonas aeruginosa NOT DETECTED NOT DETECTED Final   Stenotrophomonas maltophilia NOT DETECTED NOT DETECTED Final   Candida albicans NOT DETECTED NOT DETECTED Final   Candida auris NOT DETECTED NOT DETECTED Final   Candida glabrata NOT DETECTED NOT DETECTED Final   Candida krusei NOT DETECTED NOT DETECTED Final   Candida parapsilosis NOT DETECTED NOT DETECTED Final   Candida tropicalis NOT DETECTED NOT DETECTED Final   Cryptococcus neoformans/gattii NOT DETECTED NOT DETECTED Final   Meth resistant mecA/C and MREJ DETECTED (A) NOT DETECTED Final    Comment: CRITICAL RESULT CALLED TO, READ BACK BY AND VERIFIED WITH: T RUDISILL,PHARMD'@0148'$  09/18/22 [place] Performed at [facility], [address] [place], [place] [address]   Culture, blood (Routine X 2) w Reflex to ID Panel     Status: None (Preliminary result)   Collection Time: 09/17/22  8:03 AM   Specimen: BLOOD RIGHT HAND  Result Value Ref Range Status   Specimen Description BLOOD RIGHT HAND  Final   Special Requests   Final    BOTTLES DRAWN AEROBIC ONLY Blood Culture results may not be optimal due to an inadequate volume of blood received in culture bottles   Culture   Final    NO GROWTH 3 DAYS Performed at [facility], [place] [address], [place], [place] [address]    Report Status PENDING   Incomplete      Studies: No results found.  Scheduled Meds:  apixaban  2.5 mg Oral BID   Chlorhexidine Gluconate Cloth  6 each Topical Q0600   [START ON 09/21/2022] darbepoetin (ARANESP) injection - DIALYSIS  100 mcg Intravenous Q Tue-HD   hydrALAZINE  100 mg Oral 2 times per day on Tue Thu Sat   hydrALAZINE  100 mg Oral 3 times per day on Sun Mon Wed Fri   hydrocortisone cream   Topical TID    HYDROmorphone (DILAUDID) injection  0.5 mg Intravenous Once   metoprolol tartrate  50 mg Oral BID   mometasone-formoterol  2 puff Inhalation BID   pantoprazole  40 mg Oral BID   sevelamer carbonate  1,600 mg Oral TID WC    Continuous Infusions:  vancomycin 500 mg (09/18/22 1550)     LOS: 3 days     Alma Friendly, MD [facility]  If 7PM-7AM, please contact night-coverage [email or web address] 09/20/2022, 4:04 PM

## 2022-09-20 NOTE — NC FL2 (Signed)
[place] LEVEL OF CARE SCREENING TOOL     IDENTIFICATION  Patient Name: Diamond [name] Birthdate: [birth year] Sex: female Admission Date (Current Location): 09/16/2022  [facility] and [place] Number:  Herbalist and Address:  [facility] [place], [place] [address], [place], [place] [address]      Provider Number: [id number]  Attending Physician Name and Address:  Alma Friendly, MD  Relative Name and Phone Number:  [name] [phone]    Current Level of Care: Hospital Recommended Level of Care: [place] Prior Approval Number:    Date Approved/Denied:   PASRR Number: [id number]  Discharge Plan: SNF    Current Diagnoses: Patient Active Problem List   Diagnosis Date Noted   Thyroid nodule 09/17/2022   Empty sella ([place]) 09/17/2022   Discitis of lumbosacral region 07/26/2022   Hypertensive urgency 07/21/2022   Bacteremia 06/30/2022   Malnutrition of moderate degree 06/22/2022   Physical deconditioning 06/18/2022   Pressure injury of skin 06/16/2022   MRSA bacteremia 06/13/2022   ESRD on dialysis [facility])    Paroxysmal atrial flutter ([place])    Acute metabolic encephalopathy    Acute on chronic anemia with positive fecal occult  02/05/2022   Multiple polyps of sigmoid colon    Heme positive stool    Duodenitis    Candida esophagitis ([place])    Gallbladder mass    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  01/16/2022   Septic discitis of lumbar region 01/16/2022   Hepatitis C without hepatic coma 01/15/2022   Sepsis due to methicillin resistant Staphylococcus aureus ([place]) 12/31/2021   Atrial flutter ([place]) 12/25/2021   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 seen on TEE on 1/17 12/24/2021   Lung nodule seen on imaging study 12/23/2021   Vertebral osteomyelitis ([place])    Sepsis due to methicillin susceptible Staphylococcus aureus ([place]) 12/17/2021   Lower back pain 12/17/2021    ESRD on hemodialysis ([place]) 12/17/2021   Encounter for screening for other viral diseases 11/03/2021   Allergy, unspecified, initial encounter 09/14/2021   Coagulation defect, unspecified ([place]) 09/21/5101   Complication of vascular dialysis catheter 09/14/2021   Gout due to renal impairment, right ankle and foot 09/14/2021   Iron deficiency anemia, unspecified 09/14/2021   Pain, unspecified 09/14/2021   Pruritus, unspecified 09/14/2021   Secondary hyperparathyroidism of renal origin ([place]) 09/14/2021   Shortness of breath 09/14/2021   Unspecified protein-calorie malnutrition ([place]) 09/14/2021   Vitamin D deficiency 01/02/2021   Hyperparathyroidism ([place]) 12/31/2020   CKD (chronic kidney disease), stage III ([place]) 11/10/2019   Cardiomegaly 11/05/2019   Hyperkalemia 11/05/2019   Hypokalemia 10/18/2019   Pneumonia due to COVID-19 virus 10/17/2019   Hypertension    Asthma    Gout    Hyponatremia    Multifactorial anemia-acute blood loss from bleeding HD catheter site-superimposed on anemia related to ESRD.     Orientation RESPIRATION BLADDER Height & Weight     Self, Place  Normal Incontinent, External catheter Weight: 115 lb 8.3 oz (52.4 kg) Height:  '5\' 6"'$  (167.6 cm)  BEHAVIORAL SYMPTOMS/MOOD NEUROLOGICAL BOWEL NUTRITION STATUS      Continent Diet (See DC summary)  AMBULATORY STATUS COMMUNICATION OF NEEDS Skin   Extensive Assist Verbally Normal                       Personal Care Assistance Level of Assistance  Bathing, Feeding, Dressing Bathing Assistance: Maximum assistance Feeding assistance: Limited assistance Dressing  Assistance: Maximum assistance     Functional Limitations Info  Sight, Hearing, Speech Sight Info: Impaired Hearing Info: Adequate Speech Info: Adequate    SPECIAL CARE FACTORS FREQUENCY  PT (By licensed PT), OT (By licensed OT)     PT Frequency: 5x week OT Frequency: 5x week            Contractures Contractures Info: Not present     Additional Factors Info  Code Status, Allergies Code Status Info: Full Allergies Info: Shellfish Allergy, Anesthesia           Current Medications (09/20/2022):  This is the current hospital active medication list Current Facility-Administered Medications  Medication Dose Route Frequency Provider Last Rate Last Admin   acetaminophen (TYLENOL) tablet 650 mg  650 mg Oral Q6H PRN Etta Quill, DO   650 mg at 09/17/22 4081   Or   acetaminophen (TYLENOL) suppository 650 mg  650 mg Rectal Q6H PRN Etta Quill, DO       albuterol (PROVENTIL) (2.5 MG/3ML) 0.083% nebulizer solution 2.5 mg  2.5 mg Inhalation Q4H PRN Etta Quill, DO       apixaban Arne [place]) tablet 2.5 mg  2.5 mg Oral BID Jennette Kettle M, DO   2.5 mg at 09/20/22 4481   Chlorhexidine Gluconate Cloth 2 % PADS 6 each  6 each Topical Q0600 Roney Jaffe, MD   6 each at 09/20/22 0640   cloNIDine (CATAPRES) tablet 0.2 mg  0.2 mg Oral Q4H PRN Kristopher Oppenheim, DO   0.2 mg at 09/19/22 0541   [START ON 09/21/2022] Darbepoetin Alfa (ARANESP) injection 100 mcg  100 mcg Intravenous Q Tue-HD Corliss Parish, MD       hydrALAZINE (APRESOLINE) tablet 100 mg  100 mg Oral 2 times per day on Tue Thu Sat Etta Quill, DO   100 mg at 09/18/22 2146   hydrALAZINE (APRESOLINE) tablet 100 mg  100 mg Oral 3 times per day on Sun Mon Wed Fri Etta Quill, DO   100 mg at 09/20/22 8563   hydrocortisone cream 1 %   Topical TID Alma Friendly, MD   Given at 09/20/22 0940   labetalol (NORMODYNE) injection 10-20 mg  10-20 mg Intravenous Q2H PRN Etta Quill, DO   20 mg at 09/19/22 1497   metoprolol tartrate (LOPRESSOR) tablet 50 mg  50 mg Oral BID Etta Quill, DO   50 mg at 09/20/22 0263   mometasone-formoterol (DULERA) 200-5 MCG/ACT inhaler 2 puff  2 puff Inhalation BID Etta Quill, DO   2 puff at 09/20/22 0820   ondansetron (ZOFRAN) tablet 4 mg  4 mg Oral Q6H PRN Etta Quill, DO       Or   ondansetron [facility])  injection 4 mg  4 mg Intravenous Q6H PRN Etta Quill, DO       Oral care mouth rinse  15 mL Mouth Rinse PRN Alma Friendly, MD       oxyCODONE-acetaminophen (PERCOCET/ROXICET) 5-325 MG per tablet 1 tablet  1 tablet Oral Q8H PRN Alma Friendly, MD   1 tablet at 09/20/22 1332   pantoprazole (PROTONIX) EC tablet 40 mg  40 mg Oral BID Jennette Kettle M, DO   40 mg at 09/20/22 7858   sevelamer carbonate (RENVELA) tablet 1,600 mg  1,600 mg Oral TID WC Jennette Kettle M, DO   1,600 mg at 09/20/22 1320   vancomycin (VANCOREADY) IVPB 500 mg/100 mL  500 mg Intravenous Q T,Th,Sa-HD  Ventura Sellers, RPH 100 mL/hr at 09/18/22 1550 500 mg at 09/18/22 1550     Discharge Medications: Please see discharge summary for a list of discharge medications.  Relevant Imaging Results:  Relevant Lab Results:   Additional Information SS#: [id number]; HD TTS at [facility], arrives 11:30 for 11:45 chair time  Coralee Pesa, [place]

## 2022-09-20 NOTE — Progress Notes (Addendum)
[facility] for Infectious Disease    Date of Admission:  09/16/2022   Total days of antibiotics 4           ID: Diamond [name] is a 71 y.o. female with ESRD on HD with recurrent MRSA bacteremia/lumbar discitis/epidural abscess Principal Problem:   Acute metabolic encephalopathy Active Problems:   ESRD on hemodialysis ([place])   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    Paroxysmal atrial flutter (HCC)   Hypertensive urgency   Thyroid nodule   Empty sella (HCC)    Subjective: Anxious to go home; afebrile  Medications:   apixaban  2.5 mg Oral BID   Chlorhexidine Gluconate Cloth  6 each Topical Q0600   [START ON 09/21/2022] darbepoetin (ARANESP) injection - DIALYSIS  100 mcg Intravenous Q Tue-HD   hydrALAZINE  100 mg Oral 2 times per day on Tue Thu Sat   hydrALAZINE  100 mg Oral 3 times per day on Sun Mon Wed Fri   hydrocortisone cream   Topical TID   metoprolol tartrate  50 mg Oral BID   mometasone-formoterol  2 puff Inhalation BID   pantoprazole  40 mg Oral BID   sevelamer carbonate  1,600 mg Oral TID WC   vancomycin variable dose per unstable renal function (pharmacist dosing)   Does not apply See admin instructions    Objective: Vital signs in last 24 hours: Temp:  [97.8 F (36.6 C)-98.4 F (36.9 C)] 98.4 F (36.9 C) (10/16 1107) Pulse Rate:  [78-86] 79 (10/16 1107) Resp:  [14-18] 18 (10/16 1107) BP: (113-170)/(76-106) 113/76 (10/16 1107) SpO2:  [98 %-100 %] 98 % (10/16 0346)  Physical Exam  Constitutional:  oriented to person, place, and time. appears well-developed and well-nourished. No distress.  HENT: [place]/AT, PERRLA, no scleral icterus Mouth/Throat: Oropharynx is clear and moist. No oropharyngeal exudate.  Cardiovascular: Normal rate, regular rhythm and normal heart sounds. Exam reveals no gallop and no friction rub.  No murmur heard.  Pulmonary/Chest: Effort normal and breath sounds normal. No respiratory distress.  has no  wheezes.  Neck = supple, no nuchal rigidity Abdominal: Soft. Bowel sounds are normal.  exhibits no distension. There is no tenderness.  Lymphadenopathy: no cervical adenopathy. No axillary adenopathy Neurological: alert and oriented to person, place, and time.  Skin: Skin is warm and dry. No rash noted. No erythema.  Psychiatric: a normal mood and affect.  behavior is normal.    Lab Results Recent Labs    09/19/22 0749 09/20/22 0435  WBC 5.0 3.9*  HGB 8.8* 9.0*  HCT 26.3* 26.1*  NA 134* 135  K 3.7 3.8  CL 96* 97*  CO2 26 27  BUN 16 27*  CREATININE 3.56* 5.15*   Liver Panel Recent Labs    09/19/22 0749 09/20/22 0435  ALBUMIN 2.4* 2.3*   Sedimentation Rate  C-Reactive Protein Recent Labs    09/18/22 0646  CRP 22.3*    Microbiology: blood cx 1 of 2 sets : MRSA on 10/13 Methicillin resistant staphylococcus aureus      MIC    CIPROFLOXACIN <=0.5 SENSI... Sensitive    CLINDAMYCIN <=0.25 SENS... Sensitive    ERYTHROMYCIN <=0.25 SENS... Sensitive    GENTAMICIN <=0.5 SENSI... Sensitive    Inducible Clindamycin NEGATIVE  Sensitive    OXACILLIN RESISTANT  Resistant    RIFAMPIN <=0.5 SENSI... Sensitive    TETRACYCLINE >=16 RESIST... Resistant    TRIMETH/SULFA <=10 SENSIT... Sensitive    VANCOMYCIN 1 SENSITIVE  Sensitive  Studies/Results: No results found. MRI L spine 09/17/22    IMPRESSION: 1. Findings are concerning for progression of discitis/osteomyelitis with increased soft tissue extent of infection in the paraspinal and epidural spaces. Compared to prior exam from 07/26/2022, there is increased abnormal soft tissue in the ventral epidural space at the L2-L3 and L3-L4 disc space levels resulting in moderate to severe spinal canal narrowing at these levels. There is also increased signal abnormality in the surrounding paraspinal soft tissues and bilateral psoas musculature. 2. Persistent severe spinal canal narrowing at the L5-S1 level secondary to a  central disc protrusion. 3. Stable compression deformities of the L2, L3, L4, and L5 vertebral bodies with irregular endplate erosion at the L4-L5 level, best seen on CT of the L-spine obtained on 09/16/2022.    Assessment/Plan: MRSA bacteremia and worsening discitis/lumbar osteomyelitis/epidural abscess = continue on vancomycin and plan for 8 weeks. Will check sed rate.will repeat blood cx today to ensure she is clearing bacteremia. TTE did not suggest endocarditis, but planning on re-treating extended course with 8 wk of iv vancomycin with HD, then plan to do extended course of oral abtx. To follow up with dr [place] vu.   Chronic hepatitis C without hepatic coma = to address as an outpatient. She is hep b immune  ESRD on HD on TThS schedule = had missed some sessions prior to admit but now on proper schedule. And plan to do vancomycin post HD through 11/15/22.   Will arrange follow up with Dr Prudencio Pair in 4-6 wk.  [facility] Pager: [phone]  09/20/2022, 1:33 PM

## 2022-09-20 NOTE — Progress Notes (Signed)
  Echocardiogram 2D Echocardiogram has been attempted. Pt refused exam.  Diamond [name] 09/20/2022, 2:12 PM

## 2022-09-20 NOTE — TOC Initial Note (Addendum)
Transition of Care [facility]) - Initial/Assessment Note    Patient Details  Name: Diamond [name] MRN: [id number] Date of Birth: February 16, 1951  Transition of Care [facility]) CM/SW Contact:    Coralee Pesa, [place] Phone Number: 09/20/2022, 2:46 PM  Clinical Narrative:                 CSW noted pt is disoriented at this time, and spoke with [name] (daughter) to discuss DC plans. [name] is agreeable to SNF and requests pt return to [place]. She had discharged from there a few weeks ago, and had gone home. Pt lives alone and all three children work. Dtr notes a concern that when pt discharged from [facility] last time, they didn't have the follow up care they thought they had. CSW noted she would pass on the concern to [facility]. CSW to complete FL2 and fax out referrals. TOC will continue to follow for d/c needs.  3:00 CSW spoke with Kia at [facility] who noted pt had just been there, but insurance had cut her. She stated they do not have any beds at the moment, but will review. Kia will follow up with their social work department to determine the services provided to the pt at last discharge. Expected Discharge Plan: Skilled Nursing Facility Barriers to Discharge: Continued Medical Work up, SNF Pending bed offer, Insurance Authorization   Patient Goals and CMS Choice Patient states their goals for this hospitalization and ongoing recovery are:: Pt is unable to participate in goal setting due to disorientation. CMS Medicare.gov Compare Post Acute Care list provided to:: Patient Represenative (must comment) ([name]) Choice offered to / list presented to : Adult Children  Expected Discharge Plan and Services Expected Discharge Plan: [place] Choice: [place] arrangements for the past 2 months: Single Family Home                                      Prior Living Arrangements/Services Living arrangements for the past 2 months: Single Family  Home Lives with:: Self Patient language and need for interpreter reviewed:: Yes Do you feel safe going back to the place where you live?: Yes      Need for Family Participation in Patient Care: Yes (Comment) Care giver support system in place?: No (comment) Current home services: DME Criminal Activity/Legal Involvement Pertinent to Current Situation/Hospitalization: No - Comment as needed  Activities of Daily Living      Permission Sought/Granted Permission sought to share information with : Family Supports, Chartered certified accountant granted to share information with : Yes, Verbal Permission Granted  Share Information with NAME: [name]  Permission granted to share info w AGENCY: [facility]  Permission granted to share info w Relationship: Daughter     Emotional Assessment Appearance:: Appears stated age Attitude/Demeanor/Rapport: Unable to Assess Affect (typically observed): Unable to Assess Orientation: : Oriented to Self, Oriented to Place Alcohol / Substance Use: Not Applicable Psych Involvement: No (comment)  Admission diagnosis:  Confusion [R41.0] Fall, initial encounter [W19.XXXA] Acute metabolic encephalopathy [W09.81] Hypertension, unspecified type [I10] Patient Active Problem List   Diagnosis Date Noted   Thyroid nodule 09/17/2022   Empty sella ([place]) 09/17/2022   Discitis of lumbosacral region 07/26/2022   Hypertensive urgency 07/21/2022   Bacteremia 06/30/2022   Malnutrition of moderate degree 06/22/2022   Physical deconditioning 06/18/2022   Pressure injury of skin  06/16/2022   MRSA bacteremia 06/13/2022   ESRD on dialysis [facility])    Paroxysmal atrial flutter (HCC)    Acute metabolic encephalopathy    Acute on chronic anemia with positive fecal occult  02/05/2022   Multiple polyps of sigmoid colon    Heme positive stool    Duodenitis    Candida esophagitis (HCC)    Gallbladder mass    Acute infective endocarditis with MRSA bacteremia and  discitis  01/16/2022   Septic discitis of lumbar region 01/16/2022   Hepatitis C without hepatic coma 01/15/2022   Sepsis due to methicillin resistant Staphylococcus aureus ([place]) 12/31/2021   Atrial flutter ([place]) 12/25/2021   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 seen on TEE on 1/17 12/24/2021   Lung nodule seen on imaging study 12/23/2021   Vertebral osteomyelitis ([place])    Sepsis due to methicillin susceptible Staphylococcus aureus ([place]) 12/17/2021   Lower back pain 12/17/2021   ESRD on hemodialysis ([place]) 12/17/2021   Encounter for screening for other viral diseases 11/03/2021   Allergy, unspecified, initial encounter 09/14/2021   Coagulation defect, unspecified ([place]) 38/93/7342   Complication of vascular dialysis catheter 09/14/2021   Gout due to renal impairment, right ankle and foot 09/14/2021   Iron deficiency anemia, unspecified 09/14/2021   Pain, unspecified 09/14/2021   Pruritus, unspecified 09/14/2021   Secondary hyperparathyroidism of renal origin ([place]) 09/14/2021   Shortness of breath 09/14/2021   Unspecified protein-calorie malnutrition ([place]) 09/14/2021   Vitamin D deficiency 01/02/2021   Hyperparathyroidism ([place]) 12/31/2020   CKD (chronic kidney disease), stage III ([place]) 11/10/2019   Cardiomegaly 11/05/2019   Hyperkalemia 11/05/2019   Hypokalemia 10/18/2019   Pneumonia due to COVID-19 virus 10/17/2019   Hypertension    Asthma    Gout    Hyponatremia    Multifactorial anemia-acute blood loss from bleeding HD catheter site-superimposed on anemia related to ESRD.    PCP:  [name], MD Pharmacy:   [facility] [address] [place] - [address] [place] [address] Phone: [phone] Fax: [phone]     Social Determinants of Health (SDOH) Interventions    Readmission Risk Interventions    06/30/2022    1:12 PM 06/17/2022    1:02 PM 01/22/2022   12:19 PM  Readmission Risk Prevention  Plan  Transportation Screening Complete Complete Complete  Medication Review ([place]) Complete Complete Complete  PCP or Specialist appointment within 3-5 days of discharge Complete Complete Complete  HRI or Home Care Consult Complete Complete Complete  SW Recovery Care/Counseling Consult Complete Complete Complete  Palliative Care Screening Not Applicable Not Applicable Not [place] Not Applicable Complete Not Applicable

## 2022-09-20 NOTE — Progress Notes (Signed)
Subjective: In room better than previously described-  says is having intermittent left leg numbness but no functional issues   "I am ready to go home "   Objective Vital signs in last 24 hours: Vitals:   09/19/22 1751 09/19/22 1927 09/19/22 2309 09/20/22 0346  BP: (!) 149/94 (!) 140/83 129/81 (!) 130/90  Pulse:  82 86 79  Resp:  '17 16 17  '$ Temp:  98.4 F (36.9 C) 97.8 F (36.6 C) 98.3 F (36.8 C)  TempSrc:  Axillary Oral Oral  SpO2:  100% 98% 98%  Weight:      Height:       Weight change:   Physical Exam: General: Thin elderly female NAD this a.m. lethargic, pleasantly confused Heart: RRR, no MRG Lungs: Clear anteriorly nonlabored breathing Abdomen: NABS soft, NTND no ascites Extremities: No pedal edema Dialysis Access: L UA  AVF + bruit    Home meds include - albuterol, apixaban, advair, hydralazine 100 2-3 x per day, metoprolol 50 bid, pantoprazole, sevelamer carb 2 ac tid, prns/ vits/ supps    OP HD: [place] TTS 4h   400/600  53.5kg  2/2 bath  Hep none LUA AVF - last HD 10/10 post wt 54.1kg - last Hb 10.1 - mircera 100 q2, new start  - iron sucrose 50 mg weekly - doxercalciferol 3 ug iv tts      109/100, HR 90,  RR 15  97%    K+ 5.4  BUN 42  cr 7.53   Problem/Plan Acute metabolic encephalopathy -multiple etiology MRSA bacteremia with vertebral discitis/osteomyelitis, dementia, atrial flutter, hypertension , gabapentin.   Had not really missed any HD sessions, she got her HD x 3 last week and this week had HD on Tuesday, only session missed  10/12. No sig azotemia. Seems to be improved today from what was previously described ESRD - on HD TTS.  Usually good compliance.  Missed on Thursday had hemodialysis Friday and Saturday to get back on schedule. Next planned for 10/17-  can be here or at OP center HTN /vol - looks a bit dry, keep even on HD. Get wts. BP meds, hydralazine 100 mg bid (hd days ) tid non hd , metoprolol 50 mg bid requiring IV Cardizem in setting of acute  A-fib 10/13. Progression of discitis/osteomyelitis L-spine on MRI on 09/17/2022/= ID consulting noted [facility] 10/13 1 of 4 bottles GPC, MRSA per [facility] ID on vancomycin, TTE ordered by ID and neurosurgery to eval repeat blood cultures 10/15 ordered/NS consulted noted no surgery needed currently Rapid A-fib IV Cardizem drip started by admit team 10/13 evening.  Sinus rhythm on telemetry this a.m. 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 will start esa which was the plan at OP HD. Fe IV weekly will hold in setting of infection MBD ckd - CCa elevated, hold IV vdra. Cont renvela as binder.  Phos 4.6  Louis Meckel  [facility]  09/20/2022,8:41 AM  LOS: 3 days   Labs: Basic Metabolic Panel: Recent Labs  Lab 09/18/22 0721 09/19/22 0749 09/20/22 0435  NA 136 134* 135  K 3.8 3.7 3.8  CL 95* 96* 97*  CO2 '27 26 27  '$ GLUCOSE 81 94 95  BUN 23 16 27*  CREATININE 4.72* 3.56* 5.15*  CALCIUM 9.8 9.8 9.8  PHOS 4.6 4.6 5.0*   Liver Function Tests: Recent Labs  Lab 09/15/22 0449 09/16/22 2242 09/16/22 2242 09/18/22 0721 09/19/22 0749 09/20/22 0435  AST 14 14*  --   --   --   --  ALT 8 10  --   --   --   --   ALKPHOS  --  40  --   --   --   --   BILITOT 0.5 0.7  --   --   --   --   PROT 7.0 6.5  --   --   --   --   ALBUMIN  --  2.9*   < > 2.9* 2.4* 2.3*   < > = values in this interval not displayed.   No results for input(s): "LIPASE", "AMYLASE" in the last 168 hours. Recent Labs  Lab 09/17/22 0134  AMMONIA 24   CBC: Recent Labs  Lab 09/16/22 2242 09/16/22 2248 09/17/22 0402 09/18/22 0546 09/19/22 0749 09/20/22 0435  WBC 5.5  --  6.5 5.5 5.0 3.9*  NEUTROABS  --   --   --  4.0 3.8 2.3  HGB 10.0*   < > 9.5* 10.7* 8.8* 9.0*  HCT 31.6*   < > 29.4* 31.4* 26.3* 26.1*  MCV 104.6*  --  102.1* 99.1 100.8* 97.8  PLT 100*  --  99* 109* 120* 133*   < > = values in this interval not displayed.   Cardiac Enzymes: No results for input(s): "CKTOTAL", "CKMB", "CKMBINDEX",  "TROPONINI" in the last 168 hours. CBG: No results for input(s): "GLUCAP" in the last 168 hours.    vancomycin 500 mg (09/18/22 1550)    apixaban  2.5 mg Oral BID   Chlorhexidine Gluconate Cloth  6 each Topical Q0600   hydrALAZINE  100 mg Oral 2 times per day on Tue Thu Sat   hydrALAZINE  100 mg Oral 3 times per day on Sun Mon Wed Fri   hydrocortisone cream   Topical TID   metoprolol tartrate  50 mg Oral BID   mometasone-formoterol  2 puff Inhalation BID   pantoprazole  40 mg Oral BID   sevelamer carbonate  1,600 mg Oral TID WC   vancomycin variable dose per unstable renal function (pharmacist dosing)   Does not apply See admin instructions

## 2022-09-20 NOTE — Progress Notes (Signed)
  X-cover Note: RN reports pt extremely agitated. Yelling at staff. Hallucinating she is not in the hospital but has been kidnapped.  5 mg IV haldol ordered.  Kristopher Oppenheim, DO [facility]

## 2022-09-20 NOTE — Care Management Important Message (Signed)
Important Message  Patient Details  Name: Diamond [name] MRN: [id number] Date of Birth: [birth year]   Medicare Important Message Given:  Yes     Orbie Pyo 09/20/2022, 3:13 PM

## 2022-09-20 NOTE — Progress Notes (Signed)
Patient requested to speak to her daughters. Attempted to reach [place] and received a generic voicemail. Did not leave a message. Successfully contacted [name]. Patient is presently speaking with her.

## 2022-09-21 DIAGNOSIS — I4892 Unspecified atrial flutter: Secondary | ICD-10-CM | POA: Diagnosis not present

## 2022-09-21 DIAGNOSIS — N186 End stage renal disease: Secondary | ICD-10-CM | POA: Diagnosis not present

## 2022-09-21 DIAGNOSIS — I160 Hypertensive urgency: Secondary | ICD-10-CM | POA: Diagnosis not present

## 2022-09-21 DIAGNOSIS — G9341 Metabolic encephalopathy: Secondary | ICD-10-CM | POA: Diagnosis not present

## 2022-09-21 LAB — C-REACTIVE PROTEIN: CRP: 11.8 mg/dL — ABNORMAL HIGH (ref <1.0)

## 2022-09-21 LAB — HEPATITIS B SURFACE ANTIBODY, QUANTITATIVE: Hep B S AB Quant (Post): 4.6 m[IU]/mL — ABNORMAL LOW (ref >9.9)

## 2022-09-21 LAB — SEDIMENTATION RATE: Sed Rate: 98 mm/hr — ABNORMAL HIGH (ref 0–22)

## 2022-09-21 MED ORDER — METOPROLOL TARTRATE 5 MG/5ML IV SOLN
INTRAVENOUS | Status: AC
  Filled 2022-09-21: qty 5

## 2022-09-21 MED ORDER — DILTIAZEM HCL-DEXTROSE 125-5 MG/125ML-% IV SOLN (PREMIX)
5.0000 mg/h | INTRAVENOUS | Status: DC
  Administered 2022-09-21: 5 mg/h via INTRAVENOUS
  Administered 2022-09-22: 10 mg/h via INTRAVENOUS
  Filled 2022-09-21 (×3): qty 125

## 2022-09-21 MED ORDER — METOPROLOL TARTRATE 5 MG/5ML IV SOLN
5.0000 mg | Freq: Once | INTRAVENOUS | Status: AC
  Administered 2022-09-21: 5 mg via INTRAVENOUS

## 2022-09-21 NOTE — Progress Notes (Signed)
Pharmacy Antibiotic Note  Diamond [name] is a 71 y.o. female admitted on 09/16/2022 with recurrent MRSA discitis.  ID recommends continuing Vancomycin dosing with dialysis through 11/15/22.   Dialysis days are TTS.  Anticipate HD today.  Plan: Continue Vancomycin '500mg'$  after HD on TTS. Would recommend pre-dialysis level later this week.   Height: '5\' 6"'$  (167.6 cm) Weight: 52.4 kg (115 lb 8.3 oz) IBW/kg (Calculated) : 59.3  Temp (24hrs), Avg:98.2 F (36.8 C), Min:97.5 F (36.4 C), Max:98.5 F (36.9 C)  Recent Labs  Lab 09/16/22 2242 09/16/22 2248 09/17/22 0402 09/18/22 0546 09/18/22 0721 09/19/22 0749 09/20/22 0435  WBC 5.5  --  6.5 5.5  --  5.0 3.9*  CREATININE 7.28* 7.30* 7.53*  --  4.72* 3.56* 5.15*  LATICACIDVEN 1.6  --   --   --   --   --   --     Estimated Creatinine Clearance: 8.2 mL/min (A) (by C-G formula based on SCr of 5.15 mg/dL (H)).    Allergies  Allergen Reactions   Shellfish Allergy Other (See Comments)    Unknown reaction    Other Rash    Pt states anesthesia makes her brake out in rash    Antimicrobials this admission: Vanc 10/13 >>    Dose adjustments this admission:   Microbiology results: 10/16 BCx: ngtd 10/13 BCx: 1/2 MRSA   Thank you for allowing pharmacy to be a part of this patient's care.  [employer], Pharm.D., BCPS Clinical Pharmacist Clinical phone for 09/21/2022 from 7:30-3:00 is [phone].  **Pharmacist phone directory can be found on [email or web address] listed under [place].  09/21/2022 8:31 AM

## 2022-09-21 NOTE — Progress Notes (Signed)
Physical Therapy Treatment Patient Details Name: Diamond [name] MRN: [id number] DOB: 05-15-1951 Today's Date: 09/21/2022   History of Present Illness The pt is a 71 yo female presenting 10/12 with AMS after missing x2 HD treatments and sustaining a mechanical fall at home. Pt found to have HTN emergency, and MRI showed progression of discitis/osteomyelitis at L2-3 and L3-4 with stable compression fx of L2, L3, L4, and L5. Neurosurgery recommends medical management, non-op. PMH includes: anemia, asthma, atrial flutter, ESRD on HD, gout, and HTN.    PT Comments    Pt remains confused, only oriented to self, and with flat affect. Aware pt was agitated at night and received a dose of haldol over night. Pt continues to require totalAx2 due to lack of ability to initiate any task despite max verbal and tactile cues. Pt c/o back pain t/o session. Unsure if pt is still to wear back brace as it was given in August. Pt transferred to chair today however unable to tolerate sitting due to back pain. Acute PT to cont to follow.    Recommendations for follow up therapy are one component of a multi-disciplinary discharge planning process, led by the attending physician.  Recommendations may be updated based on patient status, additional functional criteria and insurance authorization.  Follow Up Recommendations  Skilled nursing-short term rehab (<3 hours/day) Can patient physically be transported by private vehicle: No   Assistance Recommended at Discharge Frequent or constant Supervision/Assistance  Patient can return home with the following Two people to help with walking and/or transfers;Two people to help with bathing/dressing/bathroom;Assistance with cooking/housework;Assistance with feeding;Direct supervision/assist for medications management;Direct supervision/assist for financial management;Assist for transportation;Help with stairs or ramp for entrance   Equipment Recommendations  Other (comment)     Recommendations for Other Services       Precautions / Restrictions Precautions Precautions: Fall Precaution Comments: will review when pt more alert Required Braces or Orthoses: Spinal Brace Spinal Brace: Lumbar corset (not in room, was given in august, unsure if still needed) Other Brace: Spinal precautions for comfort Restrictions Weight Bearing Restrictions: No     Mobility  Bed Mobility Overal bed mobility: Needs Assistance Bed Mobility: Sidelying to Sit, Sit to Sidelying, Rolling Rolling: Total assist Sidelying to sit: Total assist, +2 for physical assistance     Sit to sidelying: Total assist, +2 for physical assistance General bed mobility comments: No active attempts to assist, pt c/o back pain    Transfers Overall transfer level: Needs assistance Equipment used: 2 person hand held assist (face to face transfer with use of bed pad) Transfers: Sit to/from Stand, Bed to chair/wheelchair/BSC Sit to Stand: Total assist, +2 physical assistance Stand pivot transfers: Total assist, +2 physical assistance         General transfer comment: No attempts to achieve fully upright from pt, assist at knees to block, hips to extend and trunk to reduce forwards flexion.    Ambulation/Gait               General Gait Details: unable at this time   Stairs             Wheelchair Mobility    Modified Rankin (Stroke Patients Only)       Balance Overall balance assessment: Needs assistance Sitting-balance support: Bilateral upper extremity supported, Feet supported Sitting balance-Leahy Scale: Zero Sitting balance - Comments: maxA, significant trunk flexion, pt with strong L lateral lean, c/o back pain Postural control: Left lateral lean Standing balance support: During functional activity,  Reliant on assistive device for balance Standing balance-Leahy Scale: Zero Standing balance comment: dependent on external support                             Cognition Arousal/Alertness: Lethargic Behavior During Therapy: Flat affect Overall Cognitive Status: Difficult to assess Area of Impairment: Following commands, Awareness, Problem solving, Attention                   Current Attention Level: Selective, Focused   Following Commands: Follows one step commands inconsistently   Awareness: Intellectual Problem Solving: Slow processing, Requires verbal cues, Requires tactile cues, Decreased initiation, Difficulty sequencing General Comments: pt with flat affect, only oriented to self, once reoriented pt unable to recall. a dose of haldol was given over night due to agitation. Pt with no initiation of movement and little active participation        Exercises      General Comments General comments (skin integrity, edema, etc.): VSS on RA      Pertinent Vitals/Pain Pain Assessment Pain Assessment: Faces Faces Pain Scale: Hurts even more Pain Location: c/o back while sitting Pain Descriptors / Indicators: Grimacing    Home Living                          Prior Function            PT Goals (current goals can now be found in the care plan section) Acute Rehab PT Goals PT Goal Formulation: With patient/family Time For Goal Achievement: 10/03/22 Potential to Achieve Goals: Fair Progress towards PT goals: Not progressing toward goals - comment    Frequency    Min 2X/week      PT Plan Current plan remains appropriate    Co-evaluation              AM-PAC PT "6 Clicks" Mobility   Outcome Measure  Help needed turning from your back to your side while in a flat bed without using bedrails?: Total Help needed moving from lying on your back to sitting on the side of a flat bed without using bedrails?: Total Help needed moving to and from a bed to a chair (including a wheelchair)?: Total Help needed standing up from a chair using your arms (e.g., wheelchair or bedside chair)?: Total Help needed to  walk in hospital room?: Total Help needed climbing 3-5 steps with a railing? : Total 6 Click Score: 6    End of Session Equipment Utilized During Treatment: Gait belt Activity Tolerance: Patient tolerated treatment well Patient left: in bed;with call bell/phone within reach;with bed alarm set Nurse Communication: Mobility status PT Visit Diagnosis: Unsteadiness on feet (R26.81);Other abnormalities of gait and mobility (R26.89);Muscle weakness (generalized) (M62.81)     Time: 1610-9604 PT Time Calculation (min) (ACUTE ONLY): 27 min  Charges:  $Therapeutic Activity: 23-37 mins                     Kittie Plater, PT, DPT Acute Rehabilitation Services Secure chat preferred Office #: [phone]    Berline Lopes 09/21/2022, 10:41 AM

## 2022-09-21 NOTE — Progress Notes (Signed)
  X-cover Note: Back from HD. In rapid afib again. Starting IV cardizem infusion     Kristopher Oppenheim, DO [facility]

## 2022-09-21 NOTE — TOC Progression Note (Signed)
Transition of Care [facility]) - Progression Note    Patient Details  Name: Diamond [name] MRN: [id number] Date of Birth: 1950-12-22  Transition of Care [facility]) CM/SW [place], [place] Phone Number: 09/21/2022, 12:43 PM  Clinical Narrative:   [place] unable to offer a bed for patient. CSW reached out to daughter, [name], to discuss other bed offers and left a voicemail, awaiting call back. CSW to follow.    Expected Discharge Plan: [place] Barriers to Discharge: Continued Medical Work up, SNF Pending bed offer, Ship broker  Expected Discharge Plan and Services Expected Discharge Plan: [place] Choice: [place] Living arrangements for the past 2 months: Single Family Home                                       Social Determinants of Health (SDOH) Interventions    Readmission Risk Interventions    06/30/2022    1:12 PM 06/17/2022    1:02 PM 01/22/2022   12:19 PM  Readmission Risk Prevention Plan  Transportation Screening Complete Complete Complete  Medication Review Press photographer) Complete Complete Complete  PCP or Specialist appointment within 3-5 days of discharge Complete Complete Complete  HRI or Home Care Consult Complete Complete Complete  SW Recovery Care/Counseling Consult Complete Complete Complete  Palliative Care Screening Not Applicable Not Applicable Not [place] Not Applicable Complete Not Applicable

## 2022-09-21 NOTE — Progress Notes (Signed)
Subjective:  events noted-  agitated last night ? Sundowning?  Given haldol.  Lethargic this AM-  PT trying to get her up-  BP low where it had been high   Objective Vital signs in last 24 hours: Vitals:   09/20/22 2320 09/21/22 0425 09/21/22 0737 09/21/22 0823  BP: (!) 156/94 (!) 148/90  (!) 119/108  Pulse: 77 95  96  Resp: '19 16  16  '$ Temp: (!) 97.5 F (36.4 C) 98.4 F (36.9 C)  99.2 F (37.3 C)  TempSrc: Oral Oral  Oral  SpO2: 100% 100% 97% 100%  Weight:      Height:       Weight change:   Physical Exam: General: Thin elderly female NAD this a.m. lethargic Heart: RRR, no MRG Lungs: Clear anteriorly nonlabored breathing Abdomen: NABS soft, NTND no ascites Extremities: No pedal edema Dialysis Access: L UA  AVF + bruit    Home meds include - albuterol, apixaban, advair, hydralazine 100 2-3 x per day, metoprolol 50 bid, pantoprazole, sevelamer carb 2 ac tid, prns/ vits/ supps    OP HD: [place] [facility] 4h   400/600  53.5kg  2/2 bath  Hep none LUA AVF - last HD 10/10 post wt 54.1kg - last Hb 10.1 - mircera 100 q2, new start  - iron sucrose 50 mg weekly - doxercalciferol 3 ug iv tts      109/100, HR 90,  RR 15  97%    K+ 5.4  BUN 42  cr 7.53   Problem/Plan Acute metabolic encephalopathy -multiple etiology MRSA bacteremia with vertebral discitis/osteomyelitis, dementia, gabapentin.   Had not really missed any HD sessions, No sig azotemia. Seemed to be improved yesterday from what was previously described but then got agitated again overnight-  I dont know if would benefit from something like seroquel ?  Per primary team ESRD - on HD TTS.  Usually good compliance.  Missed last Thursday, but had  Friday and Saturday to get back on schedule. Next planned for today - will see how it goes in terms of her cooperation HTN /vol - not grossly volume overloaded-  BP for the most part high-  lopressor, hydralazine and PRN clonidine -  min to mod UF with HD Progression of discitis/osteomyelitis  L-spine on MRI on 09/17/2022/= ID consulting noted [facility] 10/13 1 of 4 bottles GPC, MRSA per [facility] ID on vancomycin, TTE ordered by ID and neurosurgery to eval repeat blood cultures 10/15 ordered/NS consulted noted no surgery needed currently Rapid A-fib IV Cardizem drip started by admit team 10/13 evening.  Controlled  Anemia esrd - Hb 8.8 <10.7 here. On ESA weekly. Fe IV weekly will hold in setting of infection MBD ckd - CCa elevated, holding IV vdra. Cont renvela as binder.  Phos 5.0   Ednamae Schiano A [facility]  09/21/2022,9:22 AM  LOS: 4 days   Labs: Basic Metabolic Panel: Recent Labs  Lab 09/18/22 0721 09/19/22 0749 09/20/22 0435  NA 136 134* 135  K 3.8 3.7 3.8  CL 95* 96* 97*  CO2 '27 26 27  '$ GLUCOSE 81 94 95  BUN 23 16 27*  CREATININE 4.72* 3.56* 5.15*  CALCIUM 9.8 9.8 9.8  PHOS 4.6 4.6 5.0*   Liver Function Tests: Recent Labs  Lab 09/15/22 0449 09/16/22 2242 09/16/22 2242 09/18/22 0721 09/19/22 0749 09/20/22 0435  AST 14 14*  --   --   --   --   ALT 8 10  --   --   --   --  ALKPHOS  --  40  --   --   --   --   BILITOT 0.5 0.7  --   --   --   --   PROT 7.0 6.5  --   --   --   --   ALBUMIN  --  2.9*   < > 2.9* 2.4* 2.3*   < > = values in this interval not displayed.   No results for input(s): "LIPASE", "AMYLASE" in the last 168 hours. Recent Labs  Lab 09/17/22 0134  AMMONIA 24   CBC: Recent Labs  Lab 09/16/22 2242 09/16/22 2248 09/17/22 0402 09/18/22 0546 09/19/22 0749 09/20/22 0435  WBC 5.5  --  6.5 5.5 5.0 3.9*  NEUTROABS  --   --   --  4.0 3.8 2.3  HGB 10.0*   < > 9.5* 10.7* 8.8* 9.0*  HCT 31.6*   < > 29.4* 31.4* 26.3* 26.1*  MCV 104.6*  --  102.1* 99.1 100.8* 97.8  PLT 100*  --  99* 109* 120* 133*   < > = values in this interval not displayed.   Cardiac Enzymes: No results for input(s): "CKTOTAL", "CKMB", "CKMBINDEX", "TROPONINI" in the last 168 hours. CBG: No results for input(s): "GLUCAP" in the last 168 hours.     vancomycin 500 mg (09/18/22 1550)    apixaban  2.5 mg Oral BID   Chlorhexidine Gluconate Cloth  6 each Topical Q0600   Chlorhexidine Gluconate Cloth  6 each Topical Q0600   darbepoetin (ARANESP) injection - DIALYSIS  100 mcg Intravenous Q Tue-HD   hydrALAZINE  100 mg Oral 2 times per day on Tue Thu Sat   hydrALAZINE  100 mg Oral 3 times per day on Sun Mon Wed Fri   hydrocortisone cream   Topical TID   metoprolol tartrate  50 mg Oral BID   mometasone-formoterol  2 puff Inhalation BID   pantoprazole  40 mg Oral BID   sevelamer carbonate  1,600 mg Oral TID WC

## 2022-09-21 NOTE — Progress Notes (Signed)
PROGRESS NOTE  SULEMA BRAID NTI:[id number] DOB: [birth year] DOA: 09/16/2022 PCP: [name], MD   HPI/Recap of past 24 hours: Diamond [name] is a 71 y.o. female with medical history significant of ESRD on HD, PAF on eliquis, HTN, h/o recurrent MRSA bacteremia and vertebral discitis, presents the ED following a fall at home and some acute on chronic back pain.  Patient also noted to be intermittently confused, reported missing a dialysis session due to back pain as per patient.  In the ED, work-up for any traumatic injury was negative including CT head/spine.  While in the ED, patient noted to spike a temp, blood cultures were taken and patient was started on IV vancomycin and cefepime.  Of note, patient recently finished 4 weeks of IV ABx (looks like vanc) 8/8.  Readmitted 8/22 for acute on chronic back pain with MRI concerning for l2-3 paravertebral edema and still elevated crp.  Treated with another 4 weeks of Bactrim PO.  Due to history, ID was consulted, as well as nephrology for HD.  Patient admitted for further management.     Today, patient denies any new complaints.  Appears to sundown towards the end of the day.  Discussed extensively with son at bedside.       Assessment/Plan: Principal Problem:   Acute metabolic encephalopathy Active Problems:   Hypertensive urgency   ESRD on hemodialysis (HCC)   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    Paroxysmal atrial flutter (HCC)   Thyroid nodule   Empty sella (HCC)   Acute on chronic back pain MRSA bacteremia and vertebral discitis Had a temp spike, with no leukocytosis BC x2 growing MRSA, repeat pending Procalcitonin 0.62, CRP elevated UA negative Chest x-ray unremarkable Repeat Echo not done as patient refused CT lumbar spine without contrast unremarkable for any fracture or collection MRI lumbar spine showed progression of discitis with increased soft tissue extent of infection in the  paraspinal and epidural spaces ID consulted, plan for 8 weeks of IV Vancomycin with HD Neurosurgery/IR consulted no further work-up recommended Continue vancomycin  Pain management PT/OT- rec SNF, fall precautions  Hypertensive urgency Cont home PO hydralazine, lopressor, clonidine as needed  Acute metabolic encephalopathy ??  Dementia, noted to be sundowning towards the end of the day Family reports some sundowning as well at home towards evening May be due to HTN encephalopathy vs infection Management as above   ESRD on hemodialysis ([place]) 1 missed dialysis session Nephrology on board, plan for HD on 10/13 and 10/14 to get back on schedule  Paroxysmal atrial flutter ([place]) Heart rate controlled Noted to be in A-fib with RVR on 09/17/2022, s/p Cardizem drip Cont home metoprolol Cont home eliquis   History of MRSA bacteremia and discitis  Pt finished 4 weeks IV Abx, followed by 4 more weeks of bactrim for discitis Followed by ID as an outpatient   Empty sella ([place]) Seen on CT Pt denies headache, recent visual changes Monitor   Thyroid nodule Thyroid ultrasound showed normal-sized thyroid with bilateral nodules, does not meet any criteria for biopsy      Estimated body mass index is 18.65 kg/m as calculated from the following:   Height as of this encounter: '5\' 6"'$  (1.676 m).   Weight as of this encounter: 52.4 kg.     Code Status: Full  Family Communication: Discussed with son at bedside  Disposition Plan: Status is: Inpatient Remains inpatient appropriate because: Level of care      Consultants:  Nephrology ID  Procedures: None  Antimicrobials: Vancomycin  DVT prophylaxis: Eliquis   Objective: Vitals:   09/21/22 1124 09/21/22 1529 09/21/22 1611 09/21/22 1700  BP: (!) 173/103 (!) 160/123 138/87 (!) 151/105  Pulse: 78 90 98 97  Resp: 17 16    Temp: 98.7 F (37.1 C) 98.9 F (37.2 C) 98.2 F (36.8 C)   TempSrc: Oral Oral Oral   SpO2: 99% 99%  98% 100%  Weight:      Height:       No intake or output data in the 24 hours ending 09/21/22 1745   Filed Weights   09/16/22 2228 09/18/22 1435 09/18/22 1657  Weight: 50 kg 52.4 kg 52.4 kg    Exam: General: NAD, appears dry, chronically ill, awake/alert/oriented Cardiovascular: S1, S2 present Respiratory: CTAB Abdomen: Soft, nontender, nondistended, bowel sounds present Musculoskeletal: No bilateral pedal edema noted Skin: Dry Psychiatry: Normal mood  Neurology: No obvious focal neurologic deficits noted    Data Reviewed: CBC: Recent Labs  Lab 09/15/22 0449 09/16/22 2242 09/16/22 2248 09/17/22 0402 09/18/22 0546 09/19/22 0749 09/20/22 0435  WBC 4.7 5.5  --  6.5 5.5 5.0 3.9*  NEUTROABS 3,201  --   --   --  4.0 3.8 2.3  HGB 11.1* 10.0* 11.2* 9.5* 10.7* 8.8* 9.0*  HCT 34.3* 31.6* 33.0* 29.4* 31.4* 26.3* 26.1*  MCV 102.4* 104.6*  --  102.1* 99.1 100.8* 97.8  PLT 129* 100*  --  99* 109* 120* 470*   Basic Metabolic Panel: Recent Labs  Lab 09/16/22 2242 09/16/22 2248 09/17/22 0402 09/18/22 0721 09/19/22 0749 09/20/22 0435  NA 138 136 137 136 134* 135  K 4.3 4.3 5.4* 3.8 3.7 3.8  CL 99 100 100 95* 96* 97*  CO2 25  --  '26 27 26 27  '$ GLUCOSE 96 88 90 81 94 95  BUN 39* 36* 42* 23 16 27*  CREATININE 7.28* 7.30* 7.53* 4.72* 3.56* 5.15*  CALCIUM 10.2  --  10.0 9.8 9.8 9.8  PHOS  --   --   --  4.6 4.6 5.0*   GFR: Estimated Creatinine Clearance: 8.2 mL/min (A) (by C-G formula based on SCr of 5.15 mg/dL (H)). Liver Function Tests: Recent Labs  Lab 09/15/22 0449 09/16/22 2242 09/18/22 0721 09/19/22 0749 09/20/22 0435  AST 14 14*  --   --   --   ALT 8 10  --   --   --   ALKPHOS  --  40  --   --   --   BILITOT 0.5 0.7  --   --   --   PROT 7.0 6.5  --   --   --   ALBUMIN  --  2.9* 2.9* 2.4* 2.3*   No results for input(s): "LIPASE", "AMYLASE" in the last 168 hours. Recent Labs  Lab 09/17/22 0134  AMMONIA 24   Coagulation Profile: Recent Labs  Lab  09/16/22 2242  INR 1.1   Cardiac Enzymes: No results for input(s): "CKTOTAL", "CKMB", "CKMBINDEX", "TROPONINI" in the last 168 hours. BNP (last 3 results) No results for input(s): "PROBNP" in the last 8760 hours. HbA1C: No results for input(s): "HGBA1C" in the last 72 hours. CBG: No results for input(s): "GLUCAP" in the last 168 hours. Lipid Profile: No results for input(s): "CHOL", "HDL", "LDLCALC", "TRIG", "CHOLHDL", "LDLDIRECT" in the last 72 hours. Thyroid Function Tests: No results for input(s): "TSH", "T4TOTAL", "FREET4", "T3FREE", "THYROIDAB" in the last 72 hours. Anemia Panel: No results for input(s): "VITAMINB12", "FOLATE", "  FERRITIN", "TIBC", "IRON", "RETICCTPCT" in the last 72 hours. Urine analysis:    Component Value Date/Time   COLORURINE YELLOW 09/17/2022 0137   APPEARANCEUR CLOUDY (A) 09/17/2022 0137   LABSPEC 1.008 09/17/2022 0137   PHURINE 9.0 (H) 09/17/2022 0137   GLUCOSEU NEGATIVE 09/17/2022 0137   HGBUR NEGATIVE 09/17/2022 0137   BILIRUBINUR NEGATIVE 09/17/2022 0137   KETONESUR NEGATIVE 09/17/2022 0137   PROTEINUR 100 (A) 09/17/2022 0137   UROBILINOGEN 0.2 09/27/2021 1329   NITRITE NEGATIVE 09/17/2022 0137   LEUKOCYTESUR NEGATIVE 09/17/2022 0137   Sepsis Labs: '@LABRCNTIP'$ (procalcitonin:4,lacticidven:4)  ) Recent Results (from the past 240 hour(s))  Culture, blood (Routine X 2) w Reflex to ID Panel     Status: Abnormal   Collection Time: 09/17/22  7:58 AM   Specimen: BLOOD RIGHT ARM  Result Value Ref Range Status   Specimen Description BLOOD RIGHT ARM  Final   Special Requests   Final    BOTTLES DRAWN AEROBIC AND ANAEROBIC Blood Culture adequate volume   Culture  Setup Time   Final    GRAM POSITIVE COCCI IN CLUSTERS ANAEROBIC BOTTLE ONLY CRITICAL RESULT CALLED TO, READ BACK BY AND VERIFIED WITH: T RUDISILL,PHARMD'@0148'$  09/18/22 [place] Performed at [facility], [place] [address] [place], [place] [address]    Culture METHICILLIN RESISTANT  STAPHYLOCOCCUS AUREUS (A)  Final   Report Status 09/20/2022 FINAL  Final   Organism ID, Bacteria METHICILLIN RESISTANT STAPHYLOCOCCUS AUREUS  Final      Susceptibility   Methicillin resistant staphylococcus aureus - MIC*    CIPROFLOXACIN <=0.5 SENSITIVE Sensitive     ERYTHROMYCIN <=0.25 SENSITIVE Sensitive     GENTAMICIN <=0.5 SENSITIVE Sensitive     OXACILLIN RESISTANT Resistant     TETRACYCLINE >=16 RESISTANT Resistant     VANCOMYCIN 1 SENSITIVE Sensitive     TRIMETH/SULFA <=10 SENSITIVE Sensitive     CLINDAMYCIN <=0.25 SENSITIVE Sensitive     RIFAMPIN <=0.5 SENSITIVE Sensitive     Inducible Clindamycin NEGATIVE Sensitive     * METHICILLIN RESISTANT STAPHYLOCOCCUS AUREUS  Blood Culture ID Panel (Reflexed)     Status: Abnormal   Collection Time: 09/17/22  7:58 AM  Result Value Ref Range Status   Enterococcus faecalis NOT DETECTED NOT DETECTED Final   Enterococcus Faecium NOT DETECTED NOT DETECTED Final   Listeria monocytogenes NOT DETECTED NOT DETECTED Final   Staphylococcus species DETECTED (A) NOT DETECTED Final    Comment: CRITICAL RESULT CALLED TO, READ BACK BY AND VERIFIED WITH: T RUDISILL,PHARMD'@0148'$  09/18/22 [place]    Staphylococcus aureus (BCID) DETECTED (A) NOT DETECTED Final    Comment: Methicillin (oxacillin)-resistant Staphylococcus aureus (MRSA). MRSA is predictably resistant to beta-lactam antibiotics (except ceftaroline). Preferred therapy is vancomycin unless clinically contraindicated. Patient requires contact precautions if  hospitalized. CRITICAL RESULT CALLED TO, READ BACK BY AND VERIFIED WITH: T RUDISILL,PHARMD'@0148'$  09/18/22 [place]    Staphylococcus epidermidis NOT DETECTED NOT DETECTED Final   Staphylococcus lugdunensis NOT DETECTED NOT DETECTED Final   Streptococcus species NOT DETECTED NOT DETECTED Final   Streptococcus agalactiae NOT DETECTED NOT DETECTED Final   Streptococcus pneumoniae NOT DETECTED NOT DETECTED Final   Streptococcus pyogenes NOT DETECTED NOT  DETECTED Final   A.calcoaceticus-baumannii NOT DETECTED NOT DETECTED Final   Bacteroides fragilis NOT DETECTED NOT DETECTED Final   Enterobacterales NOT DETECTED NOT DETECTED Final   Enterobacter cloacae complex NOT DETECTED NOT DETECTED Final   Escherichia coli NOT DETECTED NOT DETECTED Final   Klebsiella aerogenes NOT DETECTED NOT DETECTED Final   Klebsiella oxytoca  NOT DETECTED NOT DETECTED Final   Klebsiella pneumoniae NOT DETECTED NOT DETECTED Final   Proteus species NOT DETECTED NOT DETECTED Final   Salmonella species NOT DETECTED NOT DETECTED Final   Serratia marcescens NOT DETECTED NOT DETECTED Final   Haemophilus influenzae NOT DETECTED NOT DETECTED Final   Neisseria meningitidis NOT DETECTED NOT DETECTED Final   Pseudomonas aeruginosa NOT DETECTED NOT DETECTED Final   Stenotrophomonas maltophilia NOT DETECTED NOT DETECTED Final   Candida albicans NOT DETECTED NOT DETECTED Final   Candida auris NOT DETECTED NOT DETECTED Final   Candida glabrata NOT DETECTED NOT DETECTED Final   Candida krusei NOT DETECTED NOT DETECTED Final   Candida parapsilosis NOT DETECTED NOT DETECTED Final   Candida tropicalis NOT DETECTED NOT DETECTED Final   Cryptococcus neoformans/gattii NOT DETECTED NOT DETECTED Final   Meth resistant mecA/C and MREJ DETECTED (A) NOT DETECTED Final    Comment: CRITICAL RESULT CALLED TO, READ BACK BY AND VERIFIED WITH: T RUDISILL,PHARMD'@0148'$  09/18/22 [place] Performed at [facility], [place] [address], [place], [place] [address]   Culture, blood (Routine X 2) w Reflex to ID Panel     Status: None (Preliminary result)   Collection Time: 09/17/22  8:03 AM   Specimen: BLOOD RIGHT HAND  Result Value Ref Range Status   Specimen Description BLOOD RIGHT HAND  Final   Special Requests   Final    BOTTLES DRAWN AEROBIC ONLY Blood Culture results may not be optimal due to an inadequate volume of blood received in culture bottles   Culture   Final    NO GROWTH 4  DAYS Performed at [facility], [place] [address], [place], [place] [address]    Report Status PENDING  Incomplete  Culture, blood (Routine X 2) w Reflex to ID Panel     Status: None (Preliminary result)   Collection Time: 09/20/22  3:02 PM   Specimen: BLOOD RIGHT HAND  Result Value Ref Range Status   Specimen Description BLOOD RIGHT HAND  Final   Special Requests   Final    BOTTLES DRAWN AEROBIC AND ANAEROBIC Blood Culture results may not be optimal due to an inadequate volume of blood received in culture bottles   Culture   Final    NO GROWTH < 12 HOURS Performed at [facility], [place] [address], [place], [place] [address]    Report Status PENDING  Incomplete  Culture, blood (Routine X 2) w Reflex to ID Panel     Status: None (Preliminary result)   Collection Time: 09/20/22  3:02 PM   Specimen: BLOOD RIGHT HAND  Result Value Ref Range Status   Specimen Description BLOOD RIGHT HAND  Final   Special Requests   Final    BOTTLES DRAWN AEROBIC AND ANAEROBIC Blood Culture adequate volume   Culture   Final    NO GROWTH < 12 HOURS Performed at [facility], [place] [address] [place], [place] [address]    Report Status PENDING  Incomplete      Studies: No results found.  Scheduled Meds:  apixaban  2.5 mg Oral BID   Chlorhexidine Gluconate Cloth  6 each Topical Q0600   Chlorhexidine Gluconate Cloth  6 each Topical Q0600   darbepoetin (ARANESP) injection - DIALYSIS  100 mcg Intravenous Q Tue-HD   hydrALAZINE  100 mg Oral 2 times per day on Tue Thu Sat   hydrALAZINE  100 mg Oral 3 times per day on Sun Mon Wed Fri  hydrocortisone cream   Topical TID   metoprolol tartrate  50 mg Oral BID   mometasone-formoterol  2 puff Inhalation BID   pantoprazole  40 mg Oral BID   sevelamer carbonate  1,600 mg Oral TID WC    Continuous Infusions:  vancomycin 500 mg (09/18/22 1550)     LOS: 4 days     Alma Friendly, MD [facility]  If 7PM-7AM, please contact  night-coverage [email or web address] 09/21/2022, 5:45 PM

## 2022-09-21 NOTE — Progress Notes (Addendum)
Received patient in bed to unit.  Alert x2 Informed consent signed and in chart.    Treatment completed: 1900 Patient only completed 1Hr 36 minutes   Patient tolerated well.  Transported back to the room  Alert, without acute distress.  Hand-off given to patient's nurse.   Access used: L Fistula  Access issues: Fistula infiltrated, MD Patel notified.  Total UF removed: -0.3 Medication(s) given: Vanc Post HD weight: 57.6KG   [facility]       09/21/22 1900  Vitals  Temp 99.3 F (37.4 C)  Temp Source Oral  BP (!) 180/118  MAP (mmHg) 129  BP Location Right Arm  BP Method Automatic  Patient Position (if appropriate) Lying  Pulse Rate (!) 46  Pulse Rate Source Monitor  ECG Heart Rate 92  Oxygen Therapy  SpO2 100 %  O2 Device Room Air  During Treatment Monitoring  Intra-Hemodialysis Comments Tx completed (ended treatment per RN d/t pt access infiltrated)  Post Treatment  Dialyzer Clearance Lightly streaked  Duration of HD Treatment -hour(s) 1.36 hour(s)  Liters Processed 29.1  Fluid Removed -0.3 mL  Tolerated HD Treatment No (Comment)

## 2022-09-21 NOTE — Progress Notes (Addendum)
[facility] for Infectious Disease    Date of Admission:  09/16/2022   Total days of antibiotics 5          ID: Diamond [name] is a 71 y.o. female with  MRSA bacteremia/progressing lumbar discitis Principal Problem:   Acute metabolic encephalopathy Active Problems:   ESRD on hemodialysis ([place])   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    Paroxysmal atrial flutter (HCC)   Hypertensive urgency   Thyroid nodule   Empty sella (HCC)    Subjective: Had episode of confusion last night but back to baseline this morning. She reports some right hand pain; nsgy recommending medical management  Medications:   apixaban  2.5 mg Oral BID   Chlorhexidine Gluconate Cloth  6 each Topical Q0600   Chlorhexidine Gluconate Cloth  6 each Topical Q0600   darbepoetin (ARANESP) injection - DIALYSIS  100 mcg Intravenous Q Tue-HD   hydrALAZINE  100 mg Oral 2 times per day on Tue Thu Sat   hydrALAZINE  100 mg Oral 3 times per day on Sun Mon Wed Fri   hydrocortisone cream   Topical TID   metoprolol tartrate  50 mg Oral BID   mometasone-formoterol  2 puff Inhalation BID   pantoprazole  40 mg Oral BID   sevelamer carbonate  1,600 mg Oral TID WC    Objective: Vital signs in last 24 hours: Temp:  [97.5 F (36.4 C)-99.2 F (37.3 C)] 98.7 F (37.1 C) (10/17 1124) Pulse Rate:  [75-96] 78 (10/17 1124) Resp:  [16-19] 17 (10/17 1124) BP: (119-180)/(90-108) 173/103 (10/17 1124) SpO2:  [97 %-100 %] 99 % (10/17 1124)  Physical Exam  Constitutional:  oriented to person, place, and time. appears well-developed and well-nourished. No distress.  HENT: [place]/AT, PERRLA, no scleral icterus Mouth/Throat: Oropharynx is clear and moist. No oropharyngeal exudate.  Cardiovascular: Normal rate, regular rhythm and normal heart sounds. Exam reveals no gallop and no friction rub.  No murmur heard.  Pulmonary/Chest: Effort normal and breath sounds normal. No respiratory distress.  has no  wheezes.  Neck = supple, no nuchal rigidity Abdominal: Soft. Bowel sounds are normal.  exhibits no distension. There is no tenderness.  Ext: no warmth or pain at fistula site. Has full range of motion, no swelling to right hand Skin: Skin is warm and dry. No rash noted. No erythema.  Psychiatric: a normal mood and affect.  behavior is normal.    Lab Results Recent Labs    09/19/22 0749 09/20/22 0435  WBC 5.0 3.9*  HGB 8.8* 9.0*  HCT 26.3* 26.1*  NA 134* 135  K 3.7 3.8  CL 96* 97*  CO2 26 27  BUN 16 27*  CREATININE 3.56* 5.15*   Liver Panel Recent Labs    09/19/22 0749 09/20/22 0435  ALBUMIN 2.4* 2.3*     Microbiology: 10/13 blood cx mrsa 10/16 blood cx NGTD Studies/Results:  IMPRESSION: 1. Findings are concerning for progression of discitis/osteomyelitis with increased soft tissue extent of infection in the paraspinal and epidural spaces. Compared to prior exam from 07/26/2022, there is increased abnormal soft tissue in the ventral epidural space at the L2-L3 and L3-L4 disc space levels resulting in moderate to severe spinal canal narrowing at these levels. There is also increased signal abnormality in the surrounding paraspinal soft tissues and bilateral psoas musculature. 2. Persistent severe spinal canal narrowing at the L5-S1 level secondary to a central disc protrusion. 3. Stable compression deformities of the L2, L3,  L4, and L5 vertebral bodies with irregular endplate erosion at the L4-L5 level, best seen on CT of the L-spine obtained on 09/16/2022.  Assessment/Plan: MRSA bacteremia with lumbar discitis/osteomyelitis with ventral epidural abscess= plan to treat with 8 wks of iv vancomycin post HD. Did not repeat echo since she had echo 1 month ago with preserved function no vegetations. Also suspect source is still lumbar discitis/osteomyelitis. Will discuss need for further oral abtx suppression on follow up with Dr Gale Journey.  - will check sed rate and crp  today  We will plan to see back in the ID clinic in 4 wks.  -------------- Indication: MRSA discitis/osteo Regimen: Vancomycin 500 mg/HD-TThS End date: 11/15/22 (assuming 10/16 blood cultures are clear)  Will sign off  [facility] Pager: [phone]  09/21/2022, 1:33 PM

## 2022-09-21 NOTE — Progress Notes (Signed)
PHARMACY CONSULT NOTE FOR:  OUTPATIENT  PARENTERAL ANTIBIOTIC THERAPY (OPAT)  Informational only as this patient will receive outpatient antibiotics at the hemodialysis center  Indication: MRSA discitis/osteo Regimen: Vancomycin 500 mg/HD-TThS End date: 11/15/22 (assuming 10/16 blood cultures are clear)  IV antibiotic discharge orders are pended. To discharging provider:  please sign these orders via discharge navigator,  Select New Orders & click on the button choice - Manage This Unsigned Work.     Thank you for allowing pharmacy to be a part of this patient's care.  Adria Dill, PharmD PGY-2 Infectious Diseases Resident  09/21/2022 2:00 PM

## 2022-09-22 ENCOUNTER — Inpatient Hospital Stay (HOSPITAL_COMMUNITY): Payer: Medicare HMO

## 2022-09-22 DIAGNOSIS — R7881 Bacteremia: Secondary | ICD-10-CM

## 2022-09-22 DIAGNOSIS — I4891 Unspecified atrial fibrillation: Secondary | ICD-10-CM | POA: Diagnosis not present

## 2022-09-22 DIAGNOSIS — I480 Paroxysmal atrial fibrillation: Secondary | ICD-10-CM | POA: Diagnosis not present

## 2022-09-22 DIAGNOSIS — N186 End stage renal disease: Secondary | ICD-10-CM | POA: Diagnosis not present

## 2022-09-22 DIAGNOSIS — G9341 Metabolic encephalopathy: Secondary | ICD-10-CM | POA: Diagnosis not present

## 2022-09-22 DIAGNOSIS — B9562 Methicillin resistant Staphylococcus aureus infection as the cause of diseases classified elsewhere: Secondary | ICD-10-CM | POA: Diagnosis not present

## 2022-09-22 DIAGNOSIS — I10 Essential (primary) hypertension: Secondary | ICD-10-CM | POA: Diagnosis not present

## 2022-09-22 DIAGNOSIS — Z992 Dependence on renal dialysis: Secondary | ICD-10-CM | POA: Diagnosis not present

## 2022-09-22 LAB — CBC WITH DIFFERENTIAL/PLATELET
Abs Immature Granulocytes: 0.01 10*3/uL (ref 0.00–0.07)
Basophils Absolute: 0 10*3/uL (ref 0.0–0.1)
Basophils Relative: 0 %
Eosinophils Absolute: 0.2 10*3/uL (ref 0.0–0.5)
Eosinophils Relative: 5 %
HCT: 26.2 % — ABNORMAL LOW (ref 36.0–46.0)
Hemoglobin: 8.7 g/dL — ABNORMAL LOW (ref 12.0–15.0)
Immature Granulocytes: 0 %
Lymphocytes Relative: 27 %
Lymphs Abs: 0.9 10*3/uL (ref 0.7–4.0)
MCH: 33.2 pg (ref 26.0–34.0)
MCHC: 33.2 g/dL (ref 30.0–36.0)
MCV: 100 fL (ref 80.0–100.0)
Monocytes Absolute: 0.4 10*3/uL (ref 0.1–1.0)
Monocytes Relative: 13 %
Neutro Abs: 1.9 10*3/uL (ref 1.7–7.7)
Neutrophils Relative %: 55 %
Platelets: 179 10*3/uL (ref 150–400)
RBC: 2.62 MIL/uL — ABNORMAL LOW (ref 3.87–5.11)
RDW: 17.5 % — ABNORMAL HIGH (ref 11.5–15.5)
WBC: 3.5 10*3/uL — ABNORMAL LOW (ref 4.0–10.5)
nRBC: 0 % (ref 0.0–0.2)

## 2022-09-22 LAB — RENAL FUNCTION PANEL
Albumin: 2.4 g/dL — ABNORMAL LOW (ref 3.5–5.0)
Anion gap: 13 (ref 5–15)
BUN: 28 mg/dL — ABNORMAL HIGH (ref 8–23)
CO2: 27 mmol/L (ref 22–32)
Calcium: 10.4 mg/dL — ABNORMAL HIGH (ref 8.9–10.3)
Chloride: 95 mmol/L — ABNORMAL LOW (ref 98–111)
Creatinine, Ser: 5.22 mg/dL — ABNORMAL HIGH (ref 0.44–1.00)
GFR, Estimated: 8 mL/min — ABNORMAL LOW (ref >60)
Glucose, Bld: 105 mg/dL — ABNORMAL HIGH (ref 70–99)
Phosphorus: 3.9 mg/dL (ref 2.5–4.6)
Potassium: 3.9 mmol/L (ref 3.5–5.1)
Sodium: 135 mmol/L (ref 135–145)

## 2022-09-22 LAB — ECHOCARDIOGRAM COMPLETE
AR max vel: 3.57 cm2
AV Area VTI: 2.96 cm2
AV Area mean vel: 3.5 cm2
AV Mean grad: 3 mmHg
AV Peak grad: 6.2 mmHg
Ao pk vel: 1.24 m/s
Height: 66 in
S' Lateral: 2.6 cm
Weight: 1848.34 oz

## 2022-09-22 LAB — CULTURE, BLOOD (ROUTINE X 2): Culture: NO GROWTH

## 2022-09-22 LAB — MAGNESIUM: Magnesium: 1.7 mg/dL (ref 1.7–2.4)

## 2022-09-22 MED ORDER — CHLORHEXIDINE GLUCONATE CLOTH 2 % EX PADS
6.0000 | MEDICATED_PAD | Freq: Every day | CUTANEOUS | Status: DC
  Administered 2022-09-23 – 2022-09-24 (×2): 6 via TOPICAL

## 2022-09-22 MED ORDER — DARBEPOETIN ALFA 200 MCG/0.4ML IJ SOSY
200.0000 ug | PREFILLED_SYRINGE | INTRAMUSCULAR | Status: DC
  Administered 2022-09-28: 200 ug via INTRAVENOUS
  Filled 2022-09-22: qty 0.4

## 2022-09-22 MED ORDER — DILTIAZEM HCL 30 MG PO TABS
30.0000 mg | ORAL_TABLET | Freq: Four times a day (QID) | ORAL | Status: DC
  Administered 2022-09-22 – 2022-09-23 (×2): 30 mg via ORAL
  Filled 2022-09-22 (×3): qty 1

## 2022-09-22 NOTE — Progress Notes (Signed)
Subjective:  Had HD late yesterday afternoon-  really did not take anything off-  then went into rapid afib-  req cardizem-  rate controlled this AM-  confused this AM-  did not remember HD- off to the side of the bed-    Objective Vital signs in last 24 hours: Vitals:   09/22/22 0555 09/22/22 0630 09/22/22 0700 09/22/22 0747  BP: 100/70 105/80 135/84   Pulse:   75   Resp: '20 20 15   '$ Temp:   98.7 F (37.1 C)   TempSrc:   Oral   SpO2:   98% 96%  Weight:      Height:       Weight change:   Physical Exam: General: Thin elderly female NAD this a.m. lethargic Heart: RRR, no MRG Lungs: Clear anteriorly nonlabored breathing Abdomen: NABS soft, NTND no ascites Extremities: No pedal edema Dialysis Access: L UA  AVF + bruit    Home meds include - albuterol, apixaban, advair, hydralazine 100 2-3 x per day, metoprolol 50 bid, pantoprazole, sevelamer carb 2 ac tid, prns/ vits/ supps    OP HD: [place] TTS 4h   400/600  53.5kg  2/2 bath  Hep none LUA AVF - last HD 10/10 post wt 54.1kg - last Hb 10.1 - mircera 100 q2, new start  - iron sucrose 50 mg weekly - doxercalciferol 3 ug iv tts      109/100, HR 90,  RR 15  97%    K+ 5.4  BUN 42  cr 7.53   Problem/Plan Acute metabolic encephalopathy -multiple etiology MRSA bacteremia with vertebral discitis/osteomyelitis, dementia, gabapentin.   Had not really missed any HD sessions, No sig azotemia. I do not know her baseline but she does not seem to be getting better-  required many verbal cues with HD, this will be a problem in the OP HD setting.  Not sure what the dispo plan is ESRD - on HD TTS.  Usually good compliance.  Missed last Thursday, but had  Friday and Saturday to get back on schedule. Next planned for tomorrow - will see how it goes in terms of her cooperation HTN /vol - not grossly volume overloaded-  BP for the most part high-  lopressor, hydralazine and PRN clonidine -  min to mod UF with HD-  now with the wrinkle of Afib BP is  lower Progression of discitis/osteomyelitis L-spine on MRI on 09/17/2022/= ID consulting noted [facility] 10/13 1 of 4 bottles GPC, MRSA per [facility] ID on vancomycin, TTE ordered by ID and neurosurgery to eval repeat blood cultures 10/15 ordered/NS consulted noted no surgery needed currently-  will need vand 500 mg per HD until 12/11 Rapid A-fib IV Cardizem drip started by admit team 10/13 evening.  Recurred again last night-  rate controlled this AM Anemia esrd - On ESA weekly-  will inc dose. Fe IV weekly held in setting of infection MBD ckd - CCa elevated, holding IV vdra. Cont renvela as binder.  Phos dropping-  will watch -  may need to hold   [facility]  09/22/2022,8:09 AM  LOS: 5 days   Labs: Basic Metabolic Panel: Recent Labs  Lab 09/19/22 0749 09/20/22 0435 09/22/22 0349  NA 134* 135 135  K 3.7 3.8 3.9  CL 96* 97* 95*  CO2 '26 27 27  '$ GLUCOSE 94 95 105*  BUN 16 27* 28*  CREATININE 3.56* 5.15* 5.22*  CALCIUM 9.8 9.8 10.4*  PHOS 4.6 5.0* 3.9  Liver Function Tests: Recent Labs  Lab 09/16/22 2242 09/18/22 0721 09/19/22 0749 09/20/22 0435 09/22/22 0349  AST 14*  --   --   --   --   ALT 10  --   --   --   --   ALKPHOS 40  --   --   --   --   BILITOT 0.7  --   --   --   --   PROT 6.5  --   --   --   --   ALBUMIN 2.9*   < > 2.4* 2.3* 2.4*   < > = values in this interval not displayed.   No results for input(s): "LIPASE", "AMYLASE" in the last 168 hours. Recent Labs  Lab 09/17/22 0134  AMMONIA 24   CBC: Recent Labs  Lab 09/17/22 0402 09/17/22 0402 09/18/22 0546 09/19/22 0749 09/20/22 0435 09/22/22 0349  WBC 6.5  --  5.5 5.0 3.9* 3.5*  NEUTROABS  --    < > 4.0 3.8 2.3 1.9  HGB 9.5*  --  10.7* 8.8* 9.0* 8.7*  HCT 29.4*  --  31.4* 26.3* 26.1* 26.2*  MCV 102.1*  --  99.1 100.8* 97.8 100.0  PLT 99*  --  109* 120* 133* 179   < > = values in this interval not displayed.   Cardiac Enzymes: No results for input(s): "CKTOTAL",  "CKMB", "CKMBINDEX", "TROPONINI" in the last 168 hours. CBG: No results for input(s): "GLUCAP" in the last 168 hours.    diltiazem (CARDIZEM) infusion 10 mg/hr (09/22/22 0559)   vancomycin Stopped (09/21/22 2140)    apixaban  2.5 mg Oral BID   Chlorhexidine Gluconate Cloth  6 each Topical Q0600   darbepoetin (ARANESP) injection - DIALYSIS  100 mcg Intravenous Q Tue-HD   hydrALAZINE  100 mg Oral 2 times per day on Tue Thu Sat   hydrALAZINE  100 mg Oral 3 times per day on Sun Mon Wed Fri   hydrocortisone cream   Topical TID   metoprolol tartrate       metoprolol tartrate  50 mg Oral BID   mometasone-formoterol  2 puff Inhalation BID   pantoprazole  40 mg Oral BID   sevelamer carbonate  1,600 mg Oral TID WC

## 2022-09-22 NOTE — TOC Progression Note (Signed)
Transition of Care [facility]) - Progression Note    Patient Details  Name: WYNETTA SEITH MRN: [id number] Date of Birth: 07/01/1951  Transition of Care [facility]) CM/SW [place], [place] Phone Number: 09/22/2022, 2:39 PM  Clinical Narrative:   CSW left another voicemail for daughter, [name], to discuss SNF choice. Awaiting call back.    Expected Discharge Plan: [place] Barriers to Discharge: Continued Medical Work up, SNF Pending bed offer, Ship broker  Expected Discharge Plan and Services Expected Discharge Plan: [place] Choice: [place] Living arrangements for the past 2 months: Single Family Home                                       Social Determinants of Health (SDOH) Interventions    Readmission Risk Interventions    06/30/2022    1:12 PM 06/17/2022    1:02 PM 01/22/2022   12:19 PM  Readmission Risk Prevention Plan  Transportation Screening Complete Complete Complete  Medication Review Press photographer) Complete Complete Complete  PCP or Specialist appointment within 3-5 days of discharge Complete Complete Complete  HRI or Home Care Consult Complete Complete Complete  SW Recovery Care/Counseling Consult Complete Complete Complete  Palliative Care Screening Not Applicable Not Applicable Not [place] Not Applicable Complete Not Applicable

## 2022-09-22 NOTE — Consult Note (Signed)
Cardiology Consultation   Patient ID: ILLONA [name] MRN: [id number]; DOB: 11-17-1951  Admit date: 09/16/2022 Date of Consult: 09/22/2022  PCP:  [name], MD   [place] Providers Cardiologist:  Freada Bergeron, MD        Patient Profile:   Diamond [name] is a 71 y.o. female with a hx of paroxysmal atrial fibrillation, ESRD, recurrent MRSA bacteremia, hypertension who is being seen 09/22/2022 for the evaluation of atrial fibrillation at the request of Dr Algis Liming.  History of Present Illness:   Ms. Diamond [name] is a 71 year old female with the above medical history who we are consulted to see for atrial fibrillation.  She was admitted on 09/17/2022 after having fall at home and found to be confused.  Was febrile in the ED, blood cultures were drawn and she was found to have MRSA bacteremia.  ID did not recommend TEE given suspected source of lumbar discitis/osteomyelitis.  Did have TEE 06/2022 which showed no valvular vegetations but echodensity in RVOT that has been seen on prior imaging.  Echocardiogram 08/16/2022 showed normal biventricular function, small mobile echodensity in the RVOT stable from prior imaging.  Echocardiogram today shows normal biventricular function, moderate left atrial enlargement, previously seen mass in RA not visible on current study.  Started on Cardizem drip for A-fib with RVR on 09/17/2022.  She denies any chest pain, dyspnea, or palpitations.   Past Medical History:  Diagnosis Date   Anemia    Asthma    Atrial flutter with rapid ventricular response ([place]) 12/25/2021   Chronic kidney disease    dialysis Tues Thurs Sat   Gout    Hypertension     Past Surgical History:  Procedure Laterality Date   AV FISTULA PLACEMENT Left 12/23/2021   Procedure: LEFT ARM BRACHIOBASILIC VEIN ARTERIOVENOUS (AV) FISTULA CREATION;  Surgeon: [name], MD;  Location: [place];  Service: Vascular;  Laterality: Left;   [place]  Left 03/08/2022   Procedure: LEFT SECOND STAGE BASILIC VEIN TRANSPOSITION;  Surgeon: [name], MD;  Location: [facility] OR;  Service: Vascular;  Laterality: Left;   BIOPSY  01/19/2022   Procedure: BIOPSY;  Surgeon: Lavena Bullion, DO;  Location: [name] ENDOSCOPY;  Service: Gastroenterology;;   [place] N/A 06/25/2022   Procedure: OPEN CHOLECYSTECTOMY;  Surgeon: Dwan Bolt, MD;  Location: [place];  Service: General;  Laterality: N/A;   COLONOSCOPY Left 01/19/2022   Procedure: COLONOSCOPY;  Surgeon: Lavena Bullion, DO;  Location: [place];  Service: Gastroenterology;  Laterality: Left;   ESOPHAGOGASTRODUODENOSCOPY Left 01/19/2022   Procedure: ESOPHAGOGASTRODUODENOSCOPY (EGD);  Surgeon: Lavena Bullion, DO;  Location[facility] ENDOSCOPY;  Service: Gastroenterology;  Laterality: Left;   INSERTION OF DIALYSIS CATHETER Right 12/23/2021   Procedure: INSERTION OF DIALYSIS CATHETER;  Surgeon: [name], MD;  Location: [name];  Service: Vascular;  Laterality: Right;   LAPAROSCOPY N/A 06/25/2022   Procedure: STAGING LAPAROSCOPY;  Surgeon: Dwan Bolt, MD;  Location: [place];  Service: General;  Laterality: N/A;   OPEN PARTIAL HEPATECTOMY  N/A 06/25/2022   Procedure: OPEN PARTIAL HEPATECTOMY;  Surgeon: Dwan Bolt, MD;  Location: [place];  Service: General;  Laterality: N/A;   POLYPECTOMY  01/19/2022   Procedure: POLYPECTOMY;  Surgeon: Lavena Bullion, DO;  Location: [place] ENDOSCOPY;  Service: Gastroenterology;;   TEE WITHOUT CARDIOVERSION N/A 12/22/2021   Procedure: TRANSESOPHAGEAL ECHOCARDIOGRAM (TEE);  Surgeon: Jerline Pain, MD;  Location: [facility] ENDOSCOPY;  Service: Cardiovascular;  Laterality: N/A;   TEE WITHOUT CARDIOVERSION N/A 06/17/2022   Procedure: TRANSESOPHAGEAL ECHOCARDIOGRAM (TEE);  Surgeon: Buford Dresser, MD;  Location: [facility] ENDOSCOPY;  Service: Cardiovascular;  Laterality: N/A;   ULTRASOUND GUIDANCE FOR VASCULAR ACCESS  12/23/2021   Procedure:  ULTRASOUND GUIDANCE FOR VASCULAR ACCESS;  Surgeon: [name], MD;  Location: [facility] OR;  Service: Vascular;;       Inpatient Medications: Scheduled Meds:  apixaban  2.5 mg Oral BID   Chlorhexidine Gluconate Cloth  6 each Topical Q0600   [START ON 09/28/2022] darbepoetin (ARANESP) injection - DIALYSIS  200 mcg Intravenous Q Tue-HD   hydrALAZINE  100 mg Oral 2 times per day on Tue Thu Sat   hydrALAZINE  100 mg Oral 3 times per day on Sun Mon Wed Fri   hydrocortisone cream   Topical TID   metoprolol tartrate  50 mg Oral BID   mometasone-formoterol  2 puff Inhalation BID   pantoprazole  40 mg Oral BID   sevelamer carbonate  1,600 mg Oral TID WC   Continuous Infusions:  diltiazem (CARDIZEM) infusion 7.5 mg/hr (09/22/22 1329)   vancomycin Stopped (09/21/22 2140)   PRN Meds: acetaminophen **OR** acetaminophen, albuterol, camphor-menthol, cloNIDine, labetalol, ondansetron **OR** ondansetron (ZOFRAN) IV, mouth rinse, oxyCODONE-acetaminophen  Allergies:    Allergies  Allergen Reactions   Shellfish Allergy Other (See Comments)    Unknown reaction    Other Rash    Pt states anesthesia makes her brake out in rash    Social History:   Social History   Socioeconomic History   Marital status: Widowed    Spouse name: Not on file   Number of children: Not on file   Years of education: Not on file   Highest education level: Not on file  Occupational History   Not on file  Tobacco Use   Smoking status: Never    Passive exposure: Never   Smokeless tobacco: Never  Vaping Use   Vaping Use: Never used  Substance and Sexual Activity   Alcohol use: No   Drug use: No   Sexual activity: Not Currently    Birth control/protection: Post-menopausal  Other Topics Concern   Not on file  Social History Narrative   Not on file   Social Determinants of Health   Financial Resource Strain: Not on file  Food Insecurity: No Food Insecurity (07/20/2022)   Hunger Vital Sign    Worried About  Running Out of Food in the Last Year: Never true    Ran Out of Food in the Last Year: Never true  Transportation Needs: No Transportation Needs (07/20/2022)   PRAPARE - Hydrologist (Medical): No    Lack of Transportation (Non-Medical): No  Physical Activity: Not on file  Stress: Not on file  Social Connections: Moderately Integrated (07/20/2022)   Social Connection and Isolation Panel [NHANES]    Frequency of Communication with Friends and Family: Three times a week    Frequency of Social Gatherings with Friends and Family: Three times a week    Attends Religious Services: 1 to 4 times per year    Active Member of Clubs or Organizations: Yes    Attends Archivist Meetings: 1 to 4 times per year    Marital Status: Widowed  Intimate Partner Violence: Not At Risk (07/20/2022)   Humiliation, Afraid, Rape, and Kick questionnaire    Fear of Current or Ex-Partner: No    Emotionally Abused: No  Physically Abused: No    Sexually Abused: No    Family History:    Family History  Problem Relation Age of Onset   Hypertension Mother    Hypertension Father    Colon cancer Brother    Lung cancer Brother      ROS:  Please see the history of present illness.   All other ROS reviewed and negative.     Physical Exam/Data:   Vitals:   09/22/22 0630 09/22/22 0700 09/22/22 0747 09/22/22 1100  BP: 105/80 135/84  120/81  Pulse:  75    Resp: '20 15  14  '$ Temp:  98.7 F (37.1 C)  99 F (37.2 C)  TempSrc:  Oral  Oral  SpO2:  98% 96%   Weight:      Height:        Intake/Output Summary (Last 24 hours) at 09/22/2022 1641 Last data filed at 09/21/2022 1900 Gross per 24 hour  Intake --  Output -0.3 ml  Net 0.3 ml      09/18/2022    4:57 PM 09/18/2022    2:35 PM 09/16/2022   10:28 PM  Last 3 Weights  Weight (lbs) 115 lb 8.3 oz 115 lb 8.3 oz 110 lb 3.7 oz  Weight (kg) 52.4 kg 52.4 kg 50 kg     Body mass index is 18.65 kg/m.  General:  in no  acute distress HEENT: normal Neck: no JVD Cardiac: Irregular, normal rate; no murmur  Lungs:  clear to auscultation bilaterally Abd: soft, nontender Ext: no edema Musculoskeletal:  No deformities Skin: warm and dry  Neuro:  no focal abnormalities noted Psych:  Normal affect   EKG:  The EKG was personally reviewed and demonstrates: Atrial fibrillation, right bundle branch block, rate 126 Telemetry:  Telemetry was personally reviewed and demonstrates: Went into A-fib on 10/17 at 9:30 PM, rates currently 60s to 80s  Relevant CV Studies:   Laboratory Data:  High Sensitivity Troponin:  No results for input(s): "TROPONINIHS" in the last 720 hours.   Chemistry Recent Labs  Lab 09/19/22 0749 09/20/22 0435 09/22/22 0349  NA 134* 135 135  K 3.7 3.8 3.9  CL 96* 97* 95*  CO2 '26 27 27  '$ GLUCOSE 94 95 105*  BUN 16 27* 28*  CREATININE 3.56* 5.15* 5.22*  CALCIUM 9.8 9.8 10.4*  MG  --   --  1.7  GFRNONAA 13* 8* 8*  ANIONGAP '12 11 13    '$ Recent Labs  Lab 09/16/22 2242 09/18/22 0721 09/19/22 0749 09/20/22 0435 09/22/22 0349  PROT 6.5  --   --   --   --   ALBUMIN 2.9*   < > 2.4* 2.3* 2.4*  AST 14*  --   --   --   --   ALT 10  --   --   --   --   ALKPHOS 40  --   --   --   --   BILITOT 0.7  --   --   --   --    < > = values in this interval not displayed.   Lipids No results for input(s): "CHOL", "TRIG", "HDL", "LABVLDL", "LDLCALC", "CHOLHDL" in the last 168 hours.  Hematology Recent Labs  Lab 09/19/22 0749 09/20/22 0435 09/22/22 0349  WBC 5.0 3.9* 3.5*  RBC 2.61* 2.67* 2.62*  HGB 8.8* 9.0* 8.7*  HCT 26.3* 26.1* 26.2*  MCV 100.8* 97.8 100.0  MCH 33.7 33.7 33.2  MCHC 33.5 34.5 33.2  RDW 18.0*  17.8* 17.5*  PLT 120* 133* 179   Thyroid No results for input(s): "TSH", "FREET4" in the last 168 hours.  BNPNo results for input(s): "BNP", "PROBNP" in the last 168 hours.  DDimer No results for input(s): "DDIMER" in the last 168 hours.   Radiology/Studies:  ECHOCARDIOGRAM  COMPLETE  Result Date: 09/22/2022    ECHOCARDIOGRAM REPORT   Patient Name:   ALYISSA WHIDBEE Date of Exam: 09/22/2022 Medical Rec #:  [id number]      Height:       66.0 in Accession #:    [id number]     Weight:       115.5 lb Date of Birth:  08/12/1951     BSA:          1.584 m Patient Age:    37 years       BP:           120/81 mmHg Patient Gender: F              HR:           67 bpm. Exam Location:  Inpatient Procedure: 2D Echo, Color Doppler and Cardiac Doppler Indications:    Bacteremia  History:        Patient has prior history of Echocardiogram examinations, most                 recent 08/16/2022. Arrythmias:Atrial Flutter; Risk                 Factors:Hypertension.  Sonographer:    Ronny Flurry Referring Phys: [id number] [place]  1. Left ventricular ejection fraction, by estimation, is 60 to 65%. The left ventricle has normal function. The left ventricle has no regional wall motion abnormalities. There is mild concentric left ventricular hypertrophy. Left ventricular diastolic function could not be evaluated.  2. Right ventricular systolic function is normal. The right ventricular size is normal. There is normal pulmonary artery systolic pressure.  3. Left atrial size was moderately dilated.  4. Previously noted mobile mass in the RA not visible on this study. Right atrial size was mildly dilated.  5. The mitral valve is abnormal. Mild mitral valve regurgitation. No evidence of mitral stenosis.  6. Tricuspid valve regurgitation is mild to moderate.  7. The aortic valve is tricuspid. There is mild calcification of the aortic valve. Aortic valve regurgitation is trivial. Aortic valve sclerosis/calcification is present, without any evidence of aortic stenosis. Aortic valve area, by VTI measures 2.96 cm. Aortic valve mean gradient measures 3.0 mmHg. Aortic valve Vmax measures 1.24 m/s.  8. Aortic dilatation noted. There is borderline dilatation of the ascending aorta, measuring 38 mm.  9.  The inferior vena cava is dilated in size with <50% respiratory variability, suggesting right atrial pressure of 15 mmHg. FINDINGS  Left Ventricle: Left ventricular ejection fraction, by estimation, is 60 to 65%. The left ventricle has normal function. The left ventricle has no regional wall motion abnormalities. The left ventricular internal cavity size was normal in size. There is  mild concentric left ventricular hypertrophy. Left ventricular diastolic function could not be evaluated due to atrial fibrillation. Left ventricular diastolic function could not be evaluated. Right Ventricle: The right ventricular size is normal. No increase in right ventricular wall thickness. Right ventricular systolic function is normal. There is normal pulmonary artery systolic pressure. The tricuspid regurgitant velocity is 2.27 m/s, and  with an assumed right atrial pressure of 15 mmHg, the estimated right ventricular systolic pressure is 35.6  mmHg. Left Atrium: Left atrial size was moderately dilated. Right Atrium: Previously noted mobile mass in the RA not visible on this study. Right atrial size was mildly dilated. Pericardium: There is no evidence of pericardial effusion. Mitral Valve: The mitral valve is abnormal. There is mild thickening of the mitral valve leaflet(s). Mild mitral valve regurgitation. No evidence of mitral valve stenosis. Tricuspid Valve: The tricuspid valve is normal in structure. Tricuspid valve regurgitation is mild to moderate. No evidence of tricuspid stenosis. Aortic Valve: The aortic valve is tricuspid. There is mild calcification of the aortic valve. Aortic valve regurgitation is trivial. Aortic valve sclerosis/calcification is present, without any evidence of aortic stenosis. Aortic valve mean gradient measures 3.0 mmHg. Aortic valve peak gradient measures 6.2 mmHg. Aortic valve area, by VTI measures 2.96 cm. Pulmonic Valve: The pulmonic valve was normal in structure. Pulmonic valve regurgitation  is not visualized. No evidence of pulmonic stenosis. Aorta: Aortic dilatation noted. There is borderline dilatation of the ascending aorta, measuring 38 mm. Venous: The inferior vena cava is dilated in size with less than 50% respiratory variability, suggesting right atrial pressure of 15 mmHg. IAS/Shunts: No atrial level shunt detected by color flow Doppler.  LEFT VENTRICLE PLAX 2D LVIDd:         4.10 cm   Diastology LVIDs:         2.60 cm   LV e' medial:    7.72 cm/s LV PW:         1.30 cm   LV E/e' medial:  17.1 LV IVS:        1.20 cm   LV e' lateral:   8.59 cm/s LVOT diam:     2.20 cm   LV E/e' lateral: 15.4 LV SV:         78 LV SV Index:   49 LVOT Area:     3.80 cm  RIGHT VENTRICLE             IVC RV S prime:     15.10 cm/s  IVC diam: 2.80 cm TAPSE (M-mode): 1.7 cm LEFT ATRIUM             Index        RIGHT ATRIUM           Index LA diam:        3.90 cm 2.46 cm/m   RA Area:     13.00 cm LA Vol (A2C):   71.8 ml 45.30 ml/m  RA Volume:   27.70 ml  17.49 ml/m LA Vol (A4C):   77.2 ml 48.75 ml/m LA Biplane Vol: 73.0 ml 46.09 ml/m  AORTIC VALVE AV Area (Vmax):    3.57 cm AV Area (Vmean):   3.50 cm AV Area (VTI):     2.96 cm AV Vmax:           124.00 cm/s AV Vmean:          81.800 cm/s AV VTI:            0.263 m AV Peak Grad:      6.2 mmHg AV Mean Grad:      3.0 mmHg LVOT Vmax:         116.33 cm/s LVOT Vmean:        75.400 cm/s LVOT VTI:          0.205 m LVOT/AV VTI ratio: 0.78  AORTA Ao Root diam: 3.40 cm Ao Asc diam:  3.75 cm MV E velocity: 132.00 cm/s  TRICUSPID VALVE  TR Peak grad:   20.6 mmHg                             TR Vmax:        227.00 cm/s                              SHUNTS                             Systemic VTI:  0.20 m                             Systemic Diam: 2.20 cm Glori Bickers MD Electronically signed by Glori Bickers MD Signature Date/Time: 09/22/2022/4:24:56 PM    Final      Assessment and Plan:   Paroxysmal atrial fibrillation: CHA2DS2-VASc 3  (hypertension, age, female).  Echocardiogram shows normal biventricular function, no significant valvular disease -Continue Eliquis 2.5 mg twice daily -Rates appear well controlled on metoprolol 50 mg twice daily and diltiazem drip at 5 mg/h.  Will stop gtt and convert to p.o. diltiazem 30 mg q6hrs -Likely driven by acute illness.  If having issues with rate control as she recovers, would plan to start amiodarone to maintain sinus rhythm.  Rate is currently well controlled, would continue with rate control strategy for now  Right atrial mass: Suspicious for thrombus at prior HD line site versus endocarditis given her recurrent MRSA bacteremia.  Not seen on echo 10/18  Recurrent MRSA bacteremia: Suspected source lumbar discitis/osteomyelitis.  ID planning 8 weeks of IV vancomycin  Hypertension: On hydralazine, Lopressor, diltiazem  ESRD: On HD  For questions or updates, please contact [place] Please consult [email or web address] for contact info under    Signed, Donato Heinz, MD  09/22/2022 4:41 PM

## 2022-09-22 NOTE — Progress Notes (Signed)
Occupational Therapy Treatment Patient Details Name: Diamond [name] MRN: [id number] DOB: [birth year] Today's Date: 09/22/2022   History of present illness The pt is a 71 yo female presenting 10/12 with AMS after missing x2 HD treatments and sustaining a mechanical fall at home. Pt found to have HTN emergency, and MRI showed progression of discitis/osteomyelitis at L2-3 and L3-4 with stable compression fx of L2, L3, L4, and L5. Neurosurgery recommends medical management, non-op. PMH includes: anemia, asthma, atrial flutter, ESRD on HD, gout, and HTN.   OT comments  Pt with significant progress towards OT goals. Continues with decreased orientation, strength, balance, awareness, cognition, and activity tolerance. Pt aware of urge to have BM on arrival and requesting to use restroom. Pt requiring max A +2 for squat pivot to BSC, and total A for pericare. Attempting to follow all simple commands this session. Reporting dizziness on toilet and decreased command following. MAP 117 at beginning of session and 107 on commode. Returning to supine and VSS. Providing education regarding spinal precautions and able to recall with min A after providing education 3x. Continue to recommend SNF for continued OT services.    Recommendations for follow up therapy are one component of a multi-disciplinary discharge planning process, led by the attending physician.  Recommendations may be updated based on patient status, additional functional criteria and insurance authorization.    Follow Up Recommendations  Skilled nursing-short term rehab (<3 hours/day)    Assistance Recommended at Discharge Frequent or constant Supervision/Assistance  Patient can return home with the following  A lot of help with walking and/or transfers;A lot of help with bathing/dressing/bathroom;Assistance with cooking/housework;Direct supervision/assist for medications management;Help with stairs or ramp for entrance;Assist for  transportation;Direct supervision/assist for financial management   Equipment Recommendations  Other (comment) (defer to next venue)    Recommendations for Other Services      Precautions / Restrictions Precautions Precautions: Fall Precaution Comments: Pt attempting to recall. Continued to require min cues after review 3x Required Braces or Orthoses: Spinal Brace Spinal Brace: Lumbar corset (not in room. Was given in August. Unsure if still needs to wear) Other Brace: Spinal precautions for comfort Restrictions Weight Bearing Restrictions: No       Mobility Bed Mobility Overal bed mobility: Needs Assistance Bed Mobility: Sidelying to Sit, Sit to Sidelying, Rolling Rolling: Max assist Sidelying to sit: Max assist     Sit to sidelying: Max assist General bed mobility comments: to elevate trunk and advance LE to EOB. Additionally max A for return to supine due to assistance with LE and guidance for trunk to maintain precautions    Transfers Overall transfer level: Needs assistance Equipment used: 2 person hand held assist (face to face transfer) Transfers: Sit to/from Stand, Bed to chair/wheelchair/BSC Sit to Stand: Max assist, +2 physical assistance Stand pivot transfers: Max assist, +2 physical assistance         General transfer comment: Mod A +2 to commode, but with fatigue and onset of dizziness, max A back to bed with face to face A and second person to assist with guidance of hips.     Balance Overall balance assessment: Needs assistance Sitting-balance support: Bilateral upper extremity supported, Feet supported Sitting balance-Leahy Scale: Poor Sitting balance - Comments: mod A to maintain upright. Observed with lateral lean and up to mod A to avoid.   Standing balance support: During functional activity Standing balance-Leahy Scale: Zero Standing balance comment: dependent on significant external support. BLE blocking  ADL either performed or assessed with clinical judgement   ADL Overall ADL's : Needs assistance/impaired                     Lower Body Dressing: Total assistance Lower Body Dressing Details (indicate cue type and reason): don socks Toilet Transfer: +2 for physical assistance;Squat-pivot;BSC/3in1;Maximal assistance Toilet Transfer Details (indicate cue type and reason): max A +2 to commode, but with fatigue and onset of dizziness, heavy max A back to bed with face to face A and second person to assist with guidance of hips. Toileting- Clothing Manipulation and Hygiene: Total assistance Toileting - Clothing Manipulation Details (indicate cue type and reason): nurse tech providing pericare     Functional mobility during ADLs: Moderate assistance;Maximal assistance;+2 for physical assistance General ADL Comments: mod-max A +2 for sit<>stand and stand pivot    Extremity/Trunk Assessment Upper Extremity Assessment Upper Extremity Assessment: RUE deficits/detail;LUE deficits/detail;Generalized weakness RUE Deficits / Details: actively using to scratch back LUE Deficits / Details: Actively using LUE to speak on the phone. Pt holding phone to ear without A at bed level with [facility] elevated   Lower Extremity Assessment Lower Extremity Assessment: Defer to PT evaluation        Vision   Additional Comments: Pt reporting blurred vision as compared to typical. Will continue to assess   Perception Perception Perception: Not tested   Praxis Praxis Praxis: Not tested    Cognition Arousal/Alertness: Awake/alert Behavior During Therapy: Flat affect   Area of Impairment: Following commands, Awareness, Problem solving, Attention, Orientation, Memory, Safety/judgement                 Orientation Level: Situation, Time (needs assist to orient to day of week and situation) Current Attention Level: Sustained Memory: Decreased recall of precautions, Decreased short-term  memory Following Commands: Follows one step commands inconsistently Safety/Judgement: Decreased awareness of safety, Decreased awareness of deficits Awareness: Intellectual Problem Solving: Slow processing, Requires verbal cues, Requires tactile cues, Decreased initiation, Difficulty sequencing General Comments: On phone on arrival. and having coherent conversation. Pt following commands with increased time and physical A. Pt aware of need to have bowel movement and requesting A to get to [facility]. Pt providing additional information regarding prior living situation. Pt reporting she has wheel chair and walker at home and that son, Marchia Bond has been helping recently with IADL. Pt with decreased responsiveness after bowel movement and reporting dizzy, but VSS.        Exercises      Shoulder Instructions       General Comments MAP went from 117 to 107 and pt symptomatic with dizziness on [facility]    Pertinent Vitals/ Pain       Pain Assessment Pain Assessment: Faces Faces Pain Scale: Hurts even more Pain Location: back pain before and throughout session. Maintaining spinal precautions with cues Pain Descriptors / Indicators: Grimacing, Guarding Pain Intervention(s): Limited activity within patient's tolerance, Monitored during session  Home Living                                          Prior Functioning/Environment              Frequency  Min 2X/week        Progress Toward Goals  OT Goals(current goals can now be found in the care plan section)  Progress towards OT goals: Progressing toward goals  Acute Rehab OT Goals Patient Stated Goal: lay down OT Goal Formulation: With patient Time For Goal Achievement: 10/03/22 Potential to Achieve Goals: Good ADL Goals Pt Will Perform Grooming: with set-up;sitting Pt Will Perform Upper Body Dressing: with min assist;sitting Pt Will Perform Lower Body Dressing: with mod assist;sit to/from stand Pt Will Transfer to  Toilet: with mod assist;stand pivot transfer;bedside commode Additional ADL Goal #1: Pt will sit EOB 5+ minutes with supervision as precursor to ADL  Plan Discharge plan remains appropriate;Frequency remains appropriate    Co-evaluation                 AM-PAC OT "6 Clicks" Daily Activity     Outcome Measure   Help from another person eating meals?: A Lot Help from another person taking care of personal grooming?: A Lot Help from another person toileting, which includes using toliet, bedpan, or urinal?: A Lot Help from another person bathing (including washing, rinsing, drying)?: A Lot Help from another person to put on and taking off regular upper body clothing?: A Lot Help from another person to put on and taking off regular lower body clothing?: A Lot 6 Click Score: 12    End of Session Equipment Utilized During Treatment: Gait belt  OT Visit Diagnosis: Unsteadiness on feet (R26.81);Other abnormalities of gait and mobility (R26.89);Muscle weakness (generalized) (M62.81);Other symptoms and signs involving cognitive function   Activity Tolerance Patient tolerated treatment well   Patient Left in bed;with call bell/phone within reach;with bed alarm set   Nurse Communication Mobility status        Time: 9390-3009 OT Time Calculation (min): 39 min  Charges: OT General Charges $OT Visit: 1 Visit OT Treatments $Self Care/Home Management : 38-52 mins  Shanda Howells, OTR/L [facility] Acute Rehabilitation Office: [phone]   Lula Olszewski 09/22/2022, 5:26 PM

## 2022-09-22 NOTE — Progress Notes (Signed)
   09/21/22 2137  Assess: MEWS Score  BP (!) 141/103  MAP (mmHg) 116  ECG Heart Rate (!) 166  Resp 20  Level of Consciousness Alert  SpO2 97 %  O2 Device Room Air  Patient Activity (if Appropriate) In bed (asymptomatic)  Assess: MEWS Score  MEWS Temp 0  MEWS Systolic 0  MEWS Pulse 3  MEWS RR 0  MEWS LOC 0  MEWS Score 3  MEWS Score Color Yellow  Assess: if the MEWS score is Yellow or Red  Were vital signs taken at a resting state? Yes  Focused Assessment No change from prior assessment  Does the patient meet 2 or more of the SIRS criteria? Yes  Does the patient have a confirmed or suspected source of infection? Yes  Provider and Rapid Response Notified? Yes  MEWS guidelines implemented *See Row Information* Yes  Treat  MEWS Interventions Administered prn meds/treatments;Escalated (See documentation below)  Pain Scale 0-10  Pain Score 0  Take Vital Signs  Increase Vital Sign Frequency  Yellow: Q 2hr X 2 then Q 4hr X 2, if remains yellow, continue Q 4hrs  Escalate  MEWS: Escalate Yellow: discuss with charge nurse/RN and consider discussing with provider and RRT  Notify: Charge Nurse/RN  Name of Charge Nurse/RN Notified Intha RN  Date Charge Nurse/RN Notified 09/21/22  Time Charge Nurse/RN Notified 2150  Notify: Provider  Provider Name/Title Dr. Milderd Meager  Date Provider Notified 09/21/22  Time Provider Notified 2139  Notification Reason Change in status  Provider response See new orders  Date of Provider Response 09/21/22  Time of Provider Response 2142  Notify: Rapid Response  Name of Rapid Response RN Notified David RN  Date Rapid Response Notified 09/21/22  Document  Patient Outcome Stabilized after interventions  Progress note created (see row info) Yes  Assess: SIRS CRITERIA  SIRS Temperature  0  SIRS Pulse 1  SIRS Respirations  0  SIRS WBC 1  SIRS Score Sum  2

## 2022-09-22 NOTE — Progress Notes (Signed)
PROGRESS NOTE   [name]  VFI:[id number]    DOB: 30-Dec-1950    DOA: 09/16/2022  PCP: [name], MD   I have briefly reviewed patients previous medical records in [name].  Chief Complaint  Patient presents with   Fall   Back Pain    Brief Narrative:  71 y.o. female with medical history significant of ESRD on HD, PAF on eliquis, HTN, h/o recurrent MRSA bacteremia and vertebral discitis, presents the ED following a fall at home and some acute on chronic back pain.  Patient also noted to be intermittently confused, reported missing a dialysis session due to back pain as per patient.  In the ED, work-up for any traumatic injury was negative including CT head/spine.  While in the ED, patient noted to spike a temp, blood cultures were taken and patient was started on IV vancomycin and cefepime.  Of note, patient recently finished 4 weeks of IV ABx (looks like vanc) 8/8.  Readmitted 8/22 for acute on chronic back pain with MRI concerning for l2-3 paravertebral edema and still elevated crp.  Treated with another 4 weeks of Bactrim PO.  Due to history, ID was consulted, as well as nephrology for HD.  Patient admitted for further management.   Assessment & Plan:  Principal Problem:   Acute metabolic encephalopathy Active Problems:   Hypertensive urgency   ESRD on hemodialysis (HCC)   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    Paroxysmal atrial flutter (HCC)   Thyroid nodule   Empty sella (HCC)   MRSA bacteremia with lumbar discitis/osteomyelitis and ventral epidural abscess and acute on chronic back pain Had a temp spike, with no leukocytosis BC x2 (10/13) growing MRSA.  Surveillance blood cultures x2 from 10/16: Negative to date. Procalcitonin 0.62, CRP elevated but decreasing, 27.4 > 19.3 > 22.3 > 11.8.  ESR: 98. UA negative Chest x-ray unremarkable Repeat Echo not done as patient refused and moreover echo 1 month ago with preserved function  and no vegetations and ID suspects source is still lumbar discitis/osteomyelitis. CT lumbar spine without contrast unremarkable for any fracture or collection MRI lumbar spine showed progression of discitis with increased soft tissue extent of infection in the paraspinal and epidural spaces ID consulted, plan for 8 weeks of IV Vancomycin post HD. Neurosurgery/IR consulted no further work-up recommended Pain management and appears to be controlled. PT/OT- rec SNF, fall precautions   Hypertensive urgency Cont home PO hydralazine, lopressor, clonidine as needed.  Better controlled now.   Acute metabolic encephalopathy ??  Dementia, noted to be sundowning towards the end of the day Family reports some sundowning as well at home towards evening May be due to HTN encephalopathy vs infection Management as above.  Delirium precautions.   ESRD on hemodialysis ([facility]) 1 missed dialysis session PTA Nephrology on board for HD needs.  Had HD 10/17 afternoon, not much fluid taken off but went into rapid A-fib requiring Cardizem.  Nephrology also indicates that she is confused and did not remember HD.  Anemia of ESRD/chronic disease Stable.  Leukopenia and thrombocytopenia: Thrombocytopenia resolved.  Leukopenia stable.?  Related to infection or antimicrobials.   Paroxysmal atrial flutter (HCC)/A-fib with RVR Noted to be in A-fib with RVR on 09/17/2022, s/p Cardizem drip Cont home metoprolol Cont home eliquis Post HD on 10/17, went into A-fib with RVR up to 160s, started on Cardizem drip and now controlled ventricular rate on 10 mg. The RVR appears to be a recurrent issue, may  be precipitated by ongoing acute infection.  Consulted cardiology for assistance.?  Initiation of Cardizem in addition to metoprolol tartrate 50 Mg twice daily. Follows with [name], [name].   History of MRSA bacteremia and discitis  Pt finished 4 weeks IV Abx, followed by 4 more weeks of bactrim for  discitis Followed by ID as an outpatient   Empty sella ([name]) Seen on CT Pt denies headache, recent visual changes Monitor   Thyroid nodule Thyroid ultrasound showed normal-sized thyroid with bilateral nodules, does not meet any criteria for biopsy    Body mass index is 18.65 kg/m.   DVT prophylaxis: apixaban (ELIQUIS) tablet 2.5 mg Start: 09/17/22 0315     Code Status: Full Code:  Family Communication: None at bedside Disposition:  Status is: Inpatient Remains inpatient appropriate because: Bacteremia on IV antibiotics pending surveillance blood cultures to be negative, A-fib with RVR on Cardizem drip     Consultants:   Nephrology Infectious disease Neurosurgery Cardiology  Procedures:     Antimicrobials:   As above   Subjective:  Poor historian.  Denied complaints. Specifically asked about pain and then she said she had some pain.  Denied palpitations, chest pain, dyspnea, dizziness or lightheadedness.  Objective:   Vitals:   09/22/22 0630 09/22/22 0700 09/22/22 0747 09/22/22 1100  BP: 105/80 135/84  120/81  Pulse:  75    Resp: _0 Temp:  98.7 F (37.1 C)  99 F (37.2 C)  TempSrc:  Oral  Oral  SpO2:  98% 96%   Weight:      Height:        General exam: Elderly female, moderately built, frail and chronically ill looking sitting up comfortably in bed without distress. Respiratory system: Clear to auscultation. Respiratory effort normal. Cardiovascular system: S1 & S2 heard, irregular irregular. No JVD, murmurs, rubs, gallops or clicks. No pedal edema.  Telemetry personally reviewed: Last night had A-fib with RVR up to the 140-160s, currently with controlled ventricular rate. Gastrointestinal system: Abdomen is nondistended, soft and nontender. No organomegaly or masses felt. Normal bowel sounds heard. Central nervous system: Alert and oriented x2. No focal neurological deficits. Extremities: Symmetric 5 x 5 power. Skin: No rashes, lesions or  ulcers Psychiatry: Judgement and insight appear impaired.  Mood & affect appropriate.     Data Reviewed:   I have personally reviewed following labs and imaging studies   CBC: Recent Labs  Lab 09/19/22 0749 09/20/22 0435 09/22/22 0349  WBC 5.0 3.9* 3.5*  NEUTROABS 3.8 2.3 1.9  HGB 8.8* 9.0* 8.7*  HCT 26.3* 26.1* 26.2*  MCV 100.8* 97.8 100.0  PLT 120* 133* 292    Basic Metabolic Panel: Recent Labs  Lab 09/17/22 0402 09/18/22 0721 09/19/22 0749 09/20/22 0435 09/22/22 0349  NA 137 136 134* 135 135  K 5.4* 3.8 3.7 3.8 3.9  CL 100 95* 96* 97* 95*  CO2 _1 GLUCOSE 90 81 94 95 105*  BUN 42* 23 16 27* 28*  CREATININE 7.53* 4.72* 3.56* 5.15* 5.22*  CALCIUM 10.0 9.8 9.8 9.8 10.4*  MG  --   --   --   --  1.7  PHOS  --  4.6 4.6 5.0* 3.9    Liver Function Tests: Recent Labs  Lab 09/16/22 2242 09/18/22 0721 09/19/22 0749 09/20/22 0435 09/22/22 0349  AST 14*  --   --   --   --   ALT 10  --   --   --   --  ALKPHOS 40  --   --   --   --   BILITOT 0.7  --   --   --   --   PROT 6.5  --   --   --   --   ALBUMIN 2.9* 2.9* 2.4* 2.3* 2.4*    CBG: No results for input(s): "GLUCAP" in the last 168 hours.  Microbiology Studies:   Recent Results (from the past 240 hour(s))  Culture, blood (Routine X 2) w Reflex to ID Panel     Status: Abnormal   Collection Time: 09/17/22  7:58 AM   Specimen: BLOOD RIGHT ARM  Result Value Ref Range Status   Specimen Description BLOOD RIGHT ARM  Final   Special Requests   Final    BOTTLES DRAWN AEROBIC AND ANAEROBIC Blood Culture adequate volume   Culture  Setup Time   Final    GRAM POSITIVE COCCI IN CLUSTERS ANAEROBIC BOTTLE ONLY CRITICAL RESULT CALLED TO, READ BACK BY AND VERIFIED WITH: T RUDISILL,[email or web address]_0  09/18/22 [place] Performed at [facility], [address], [place],  [address]    Culture METHICILLIN RESISTANT STAPHYLOCOCCUS AUREUS (A)  Final   Report Status 09/20/2022 FINAL  Final   Organism ID,  Bacteria METHICILLIN RESISTANT STAPHYLOCOCCUS AUREUS  Final      Susceptibility   Methicillin resistant staphylococcus aureus - MIC*    CIPROFLOXACIN <=0.5 SENSITIVE Sensitive     ERYTHROMYCIN <=0.25 SENSITIVE Sensitive     GENTAMICIN <=0.5 SENSITIVE Sensitive     OXACILLIN RESISTANT Resistant     TETRACYCLINE >=16 RESISTANT Resistant     VANCOMYCIN 1 SENSITIVE Sensitive     TRIMETH/SULFA <=10 SENSITIVE Sensitive     CLINDAMYCIN <=0.25 SENSITIVE Sensitive     RIFAMPIN <=0.5 SENSITIVE Sensitive     Inducible Clindamycin NEGATIVE Sensitive     * METHICILLIN RESISTANT STAPHYLOCOCCUS AUREUS  Blood Culture ID Panel (Reflexed)     Status: Abnormal   Collection Time: 09/17/22  7:58 AM  Result Value Ref Range Status   Enterococcus faecalis NOT DETECTED NOT DETECTED Final   Enterococcus Faecium NOT DETECTED NOT DETECTED Final   Listeria monocytogenes NOT DETECTED NOT DETECTED Final   Staphylococcus species DETECTED (A) NOT DETECTED Final    Comment: CRITICAL RESULT CALLED TO, READ BACK BY AND VERIFIED WITH: T RUDISILL,[email or web address]_1  09/18/22 [place]    Staphylococcus aureus (BCID) DETECTED (A) NOT DETECTED Final    Comment: Methicillin (oxacillin)-resistant Staphylococcus aureus (MRSA). MRSA is predictably resistant to beta-lactam antibiotics (except ceftaroline). Preferred therapy is vancomycin unless clinically contraindicated. Patient requires contact precautions if  hospitalized. CRITICAL RESULT CALLED TO, READ BACK BY AND VERIFIED WITH: T RUDISILL,PHARMD_2  09/18/22 [place]    Staphylococcus epidermidis NOT DETECTED NOT DETECTED Final   Staphylococcus lugdunensis NOT DETECTED NOT DETECTED Final   Streptococcus species NOT DETECTED NOT DETECTED Final   Streptococcus agalactiae NOT DETECTED NOT DETECTED Final   Streptococcus pneumoniae NOT DETECTED NOT DETECTED Final   Streptococcus pyogenes NOT DETECTED NOT DETECTED Final   A.calcoaceticus-baumannii NOT DETECTED NOT DETECTED Final   Bacteroides  fragilis NOT DETECTED NOT DETECTED Final   Enterobacterales NOT DETECTED NOT DETECTED Final   Enterobacter cloacae complex NOT DETECTED NOT DETECTED Final   Escherichia coli NOT DETECTED NOT DETECTED Final   Klebsiella aerogenes NOT DETECTED NOT DETECTED Final   Klebsiella oxytoca NOT DETECTED NOT DETECTED Final   Klebsiella pneumoniae NOT DETECTED NOT DETECTED Final   Proteus species NOT DETECTED NOT DETECTED Final   Salmonella species NOT DETECTED  NOT DETECTED Final   Serratia marcescens NOT DETECTED NOT DETECTED Final   Haemophilus influenzae NOT DETECTED NOT DETECTED Final   Neisseria meningitidis NOT DETECTED NOT DETECTED Final   Pseudomonas aeruginosa NOT DETECTED NOT DETECTED Final   Stenotrophomonas maltophilia NOT DETECTED NOT DETECTED Final   Candida albicans NOT DETECTED NOT DETECTED Final   Candida auris NOT DETECTED NOT DETECTED Final   Candida glabrata NOT DETECTED NOT DETECTED Final   Candida krusei NOT DETECTED NOT DETECTED Final   Candida parapsilosis NOT DETECTED NOT DETECTED Final   Candida tropicalis NOT DETECTED NOT DETECTED Final   Cryptococcus neoformans/gattii NOT DETECTED NOT DETECTED Final   Meth resistant mecA/C and MREJ DETECTED (A) NOT DETECTED Final    Comment: CRITICAL RESULT CALLED TO, READ BACK BY AND VERIFIED WITH: T RUDISILL,PHARMD_0  09/18/22 [place] Performed at [facility], [place] [address], [place], [place] [address]   Culture, blood (Routine X 2) w Reflex to ID Panel     Status: None   Collection Time: 09/17/22  8:03 AM   Specimen: BLOOD RIGHT HAND  Result Value Ref Range Status   Specimen Description BLOOD RIGHT HAND  Final   Special Requests   Final    BOTTLES DRAWN AEROBIC ONLY Blood Culture results may not be optimal due to an inadequate volume of blood received in culture bottles   Culture   Final    NO GROWTH 5 DAYS Performed at [facility], [place] [address] [place], [place] [address]    Report Status 09/22/2022 FINAL  Final   Culture, blood (Routine X 2) w Reflex to ID Panel     Status: None (Preliminary result)   Collection Time: 09/20/22  3:02 PM   Specimen: BLOOD RIGHT HAND  Result Value Ref Range Status   Specimen Description BLOOD RIGHT HAND  Final   Special Requests   Final    BOTTLES DRAWN AEROBIC AND ANAEROBIC Blood Culture results may not be optimal due to an inadequate volume of blood received in culture bottles   Culture   Final    NO GROWTH 2 DAYS Performed at [facility], [place] [address], [place], [place] [address]    Report Status PENDING  Incomplete  Culture, blood (Routine X 2) w Reflex to ID Panel     Status: None (Preliminary result)   Collection Time: 09/20/22  3:02 PM   Specimen: BLOOD RIGHT HAND  Result Value Ref Range Status   Specimen Description BLOOD RIGHT HAND  Final   Special Requests   Final    BOTTLES DRAWN AEROBIC AND ANAEROBIC Blood Culture adequate volume   Culture   Final    NO GROWTH 2 DAYS Performed at [facility], [place] [address], [place], [place] [address]    Report Status PENDING  Incomplete    Radiology Studies:  No results found.  Scheduled Meds:    apixaban  2.5 mg Oral BID   Chlorhexidine Gluconate Cloth  6 each Topical Q0600   [START ON 09/28/2022] darbepoetin (ARANESP) injection - DIALYSIS  200 mcg Intravenous Q Tue-HD   hydrALAZINE  100 mg Oral 2 times per day on Tue Thu Sat   hydrALAZINE  100 mg Oral 3 times per day on Sun Mon Wed Fri   hydrocortisone cream   Topical TID   metoprolol tartrate  50 mg Oral BID   mometasone-formoterol  2 puff Inhalation BID   pantoprazole  40 mg Oral BID   sevelamer carbonate  1,600 mg Oral TID WC    Continuous Infusions:    diltiazem (CARDIZEM) infusion 7.5 mg/hr (09/22/22 1329)   vancomycin Stopped (09/21/22 2140)     LOS: 5 days     Vernell Leep, MD,  FACP, [facility], [facility], [facility], [place]     To contact the attending provider between  7A-7P or the covering provider during after hours 7P-7A, please log into the web site [email or web address] and access using universal [place] password for that web site. If you do not have the password, please call the hospital operator.  09/22/2022, 3:03 PM

## 2022-09-22 NOTE — Progress Notes (Signed)
Pt receives out-pt HD at [facility] on TTS. Pt arrives at 11:25 for 11:45 chair time. Will assist as needed.  Melven Sartorius Renal Navigator [phone]

## 2022-09-22 NOTE — Progress Notes (Incomplete)
Echocardiogram 2D Echocardiogram has been performed.  Ronny Flurry 09/22/2022, 2:56 PM

## 2022-09-23 DIAGNOSIS — I480 Paroxysmal atrial fibrillation: Secondary | ICD-10-CM | POA: Diagnosis not present

## 2022-09-23 DIAGNOSIS — B9562 Methicillin resistant Staphylococcus aureus infection as the cause of diseases classified elsewhere: Secondary | ICD-10-CM | POA: Diagnosis not present

## 2022-09-23 DIAGNOSIS — Z992 Dependence on renal dialysis: Secondary | ICD-10-CM | POA: Diagnosis not present

## 2022-09-23 DIAGNOSIS — I4891 Unspecified atrial fibrillation: Secondary | ICD-10-CM | POA: Diagnosis not present

## 2022-09-23 DIAGNOSIS — R7881 Bacteremia: Secondary | ICD-10-CM | POA: Diagnosis not present

## 2022-09-23 DIAGNOSIS — G9341 Metabolic encephalopathy: Secondary | ICD-10-CM | POA: Diagnosis not present

## 2022-09-23 DIAGNOSIS — N186 End stage renal disease: Secondary | ICD-10-CM | POA: Diagnosis not present

## 2022-09-23 LAB — CBC WITH DIFFERENTIAL/PLATELET
Abs Immature Granulocytes: 0.02 10*3/uL (ref 0.00–0.07)
Basophils Absolute: 0 10*3/uL (ref 0.0–0.1)
Basophils Relative: 1 %
Eosinophils Absolute: 0.2 10*3/uL (ref 0.0–0.5)
Eosinophils Relative: 5 %
HCT: 26.5 % — ABNORMAL LOW (ref 36.0–46.0)
Hemoglobin: 9.1 g/dL — ABNORMAL LOW (ref 12.0–15.0)
Immature Granulocytes: 1 %
Lymphocytes Relative: 28 %
Lymphs Abs: 1.1 10*3/uL (ref 0.7–4.0)
MCH: 33.8 pg (ref 26.0–34.0)
MCHC: 34.3 g/dL (ref 30.0–36.0)
MCV: 98.5 fL (ref 80.0–100.0)
Monocytes Absolute: 0.4 10*3/uL (ref 0.1–1.0)
Monocytes Relative: 11 %
Neutro Abs: 2.1 10*3/uL (ref 1.7–7.7)
Neutrophils Relative %: 54 %
Platelets: 189 10*3/uL (ref 150–400)
RBC: 2.69 MIL/uL — ABNORMAL LOW (ref 3.87–5.11)
RDW: 17.7 % — ABNORMAL HIGH (ref 11.5–15.5)
WBC: 3.9 10*3/uL — ABNORMAL LOW (ref 4.0–10.5)
nRBC: 0 % (ref 0.0–0.2)

## 2022-09-23 LAB — TROPONIN I (HIGH SENSITIVITY)
Troponin I (High Sensitivity): 23 ng/L — ABNORMAL HIGH (ref <18)
Troponin I (High Sensitivity): 21 ng/L — ABNORMAL HIGH (ref <18)

## 2022-09-23 LAB — RENAL FUNCTION PANEL
Albumin: 2.5 g/dL — ABNORMAL LOW (ref 3.5–5.0)
Anion gap: 14 (ref 5–15)
BUN: 36 mg/dL — ABNORMAL HIGH (ref 8–23)
CO2: 25 mmol/L (ref 22–32)
Calcium: 10.3 mg/dL (ref 8.9–10.3)
Chloride: 96 mmol/L — ABNORMAL LOW (ref 98–111)
Creatinine, Ser: 6.42 mg/dL — ABNORMAL HIGH (ref 0.44–1.00)
GFR, Estimated: 6 mL/min — ABNORMAL LOW (ref >60)
Glucose, Bld: 103 mg/dL — ABNORMAL HIGH (ref 70–99)
Phosphorus: 5.2 mg/dL — ABNORMAL HIGH (ref 2.5–4.6)
Potassium: 4.3 mmol/L (ref 3.5–5.1)
Sodium: 135 mmol/L (ref 135–145)

## 2022-09-23 LAB — TSH: TSH: 3.268 u[IU]/mL (ref 0.350–4.500)

## 2022-09-23 MED ORDER — DILTIAZEM HCL ER COATED BEADS 120 MG PO CP24: 120.0000 mg | ORAL_CAPSULE | Freq: Every day | ORAL | Status: DC

## 2022-09-23 MED ORDER — AMIODARONE HCL IN DEXTROSE 360-4.14 MG/200ML-% IV SOLN
60.0000 mg/h | INTRAVENOUS | Status: AC
  Administered 2022-09-23 (×2): 60 mg/h via INTRAVENOUS
  Filled 2022-09-23: qty 200

## 2022-09-23 MED ORDER — AMIODARONE HCL IN DEXTROSE 360-4.14 MG/200ML-% IV SOLN
30.0000 mg/h | INTRAVENOUS | Status: AC
  Administered 2022-09-24 (×2): 30 mg/h via INTRAVENOUS
  Filled 2022-09-23 (×2): qty 200

## 2022-09-23 MED ORDER — AMIODARONE LOAD VIA INFUSION
150.0000 mg | Freq: Once | INTRAVENOUS | Status: AC
  Administered 2022-09-23: 150 mg via INTRAVENOUS
  Filled 2022-09-23: qty 83.34

## 2022-09-23 MED ORDER — DILTIAZEM HCL 30 MG PO TABS: 30.0000 mg | ORAL_TABLET | Freq: Four times a day (QID) | ORAL | Status: DC

## 2022-09-23 NOTE — Progress Notes (Signed)
Received patient in bed to unit.  Alert and oriented.  Informed consent signed and in chart.   Treatment initiated: 1430 Treatment completed: 1715  Patient tolerated well.  Transported back to the room  Alert, without acute distress.  Hand-off given to patient's nurse.   Access used: LAVG Access issues: none  Total UF removed: 0.3L Medication(s) given: tylenol    09/23/22 1730  Neurological  Level of Consciousness Alert  Orientation Level Oriented to person  Respiratory  Respiratory Pattern Regular;Unlabored  Bilateral Breath Sounds Clear;Diminished  Cardiac  Pulse Irregular  Heart Sounds S1, S2  Jugular Venous Distention (JVD) No  ECG Monitor Yes  Cardiac Rhythm Atrial fibrillation (with RVR)     [name]

## 2022-09-23 NOTE — Progress Notes (Addendum)
Rounding Note    Patient Name: Diamond [name] Date of Encounter: 09/23/2022  [place] Cardiologist: Freada Bergeron, MD   Subjective   Patient states she is tired, felt dialysis is bringing her lots of health issues over the past year.  She denies any chest pain, heart palpitation, dizziness, syncope.  She wishes there is a good news for her health.   Inpatient Medications    Scheduled Meds:  apixaban  2.5 mg Oral BID   Chlorhexidine Gluconate Cloth  6 each Topical Q0600   Chlorhexidine Gluconate Cloth  6 each Topical Q0600   [START ON 09/28/2022] darbepoetin (ARANESP) injection - DIALYSIS  200 mcg Intravenous Q Tue-HD   diltiazem  30 mg Oral Q6H   hydrALAZINE  100 mg Oral 2 times per day on Tue Thu Sat   hydrALAZINE  100 mg Oral 3 times per day on Sun Mon Wed Fri   hydrocortisone cream   Topical TID   metoprolol tartrate  50 mg Oral BID   mometasone-formoterol  2 puff Inhalation BID   pantoprazole  40 mg Oral BID   sevelamer carbonate  1,600 mg Oral TID WC   Continuous Infusions:  vancomycin Stopped (09/21/22 2140)   PRN Meds: acetaminophen **OR** acetaminophen, albuterol, camphor-menthol, cloNIDine, labetalol, ondansetron **OR** ondansetron (ZOFRAN) IV, mouth rinse, oxyCODONE-acetaminophen   Vital Signs    Vitals:   09/22/22 2347 09/23/22 0330 09/23/22 0800 09/23/22 0845  BP: (!) 139/96 (!) 142/94 (!) 141/126   Pulse: (!) 112 94 97   Resp: '16 18 17   '$ Temp: 98.7 F (37.1 C) 98.3 F (36.8 C) 97.8 F (36.6 C)   TempSrc: Oral Oral Axillary   SpO2: 100% 97% 98% 98%  Weight:      Height:        Intake/Output Summary (Last 24 hours) at 09/23/2022 1144 Last data filed at 09/23/2022 0803 Gross per 24 hour  Intake --  Output 500 ml  Net -500 ml      09/18/2022    4:57 PM 09/18/2022    2:35 PM 09/16/2022   10:28 PM  Last 3 Weights  Weight (lbs) 115 lb 8.3 oz 115 lb 8.3 oz 110 lb 3.7 oz  Weight (kg) 52.4 kg 52.4 kg 50 kg      Telemetry     Atrial fibrillation with ventricular rate of 90s, pause less than 2 seconds noted- Personally Reviewed  ECG    No EKG today - Personally Reviewed  Physical Exam   GEN: No acute distress.   Neck: No JVD Cardiac: Irregularly irregular, no murmurs, rubs, or gallops.  Respiratory: Clear to auscultation bilaterally.  On room air GI: Soft, nontender, non-distended  MS: No leg edema; No deformity. Neuro:  Nonfocal  Psych: Normal affect   Labs    High Sensitivity Troponin:  No results for input(s): "TROPONINIHS" in the last 720 hours.   Chemistry Recent Labs  Lab 09/16/22 2242 09/16/22 2248 09/20/22 0435 09/22/22 0349 09/23/22 0320  NA 138   < > 135 135 135  K 4.3   < > 3.8 3.9 4.3  CL 99   < > 97* 95* 96*  CO2 25   < > '27 27 25  '$ GLUCOSE 96   < > 95 105* 103*  BUN 39*   < > 27* 28* 36*  CREATININE 7.28*   < > 5.15* 5.22* 6.42*  CALCIUM 10.2   < > 9.8 10.4* 10.3  MG  --   --   --  1.7  --   PROT 6.5  --   --   --   --   ALBUMIN 2.9*   < > 2.3* 2.4* 2.5*  AST 14*  --   --   --   --   ALT 10  --   --   --   --   ALKPHOS 40  --   --   --   --   BILITOT 0.7  --   --   --   --   GFRNONAA 6*   < > 8* 8* 6*  ANIONGAP 14   < > '11 13 14   '$ < > = values in this interval not displayed.    Lipids No results for input(s): "CHOL", "TRIG", "HDL", "LABVLDL", "LDLCALC", "CHOLHDL" in the last 168 hours.  Hematology Recent Labs  Lab 09/20/22 0435 09/22/22 0349 09/23/22 0320  WBC 3.9* 3.5* 3.9*  RBC 2.67* 2.62* 2.69*  HGB 9.0* 8.7* 9.1*  HCT 26.1* 26.2* 26.5*  MCV 97.8 100.0 98.5  MCH 33.7 33.2 33.8  MCHC 34.5 33.2 34.3  RDW 17.8* 17.5* 17.7*  PLT 133* 179 189   Thyroid No results for input(s): "TSH", "FREET4" in the last 168 hours.  BNPNo results for input(s): "BNP", "PROBNP" in the last 168 hours.  DDimer No results for input(s): "DDIMER" in the last 168 hours.   Radiology    ECHOCARDIOGRAM COMPLETE  Result Date: 09/22/2022    ECHOCARDIOGRAM REPORT   Patient Name:    Diamond [name] Date of Exam: 09/22/2022 Medical Rec #:  [id number]      Height:       66.0 in Accession #:    [id number]     Weight:       115.5 lb Date of Birth:  01/25/1951     BSA:          1.584 m Patient Age:    71 years       BP:           120/81 mmHg Patient Gender: F              HR:           67 bpm. Exam Location:  Inpatient Procedure: 2D Echo, Color Doppler and Cardiac Doppler Indications:    Bacteremia  History:        Patient has prior history of Echocardiogram examinations, most                 recent 08/16/2022. Arrythmias:Atrial Flutter; Risk                 Factors:Hypertension.  Sonographer:    Ronny Flurry Referring Phys: [id number] [place]  1. Left ventricular ejection fraction, by estimation, is 60 to 65%. The left ventricle has normal function. The left ventricle has no regional wall motion abnormalities. There is mild concentric left ventricular hypertrophy. Left ventricular diastolic function could not be evaluated.  2. Right ventricular systolic function is normal. The right ventricular size is normal. There is normal pulmonary artery systolic pressure.  3. Left atrial size was moderately dilated.  4. Previously noted mobile mass in the RA not visible on this study. Right atrial size was mildly dilated.  5. The mitral valve is abnormal. Mild mitral valve regurgitation. No evidence of mitral stenosis.  6. Tricuspid valve regurgitation is mild to moderate.  7. The aortic valve is tricuspid. There is mild calcification of the aortic valve. Aortic valve regurgitation is trivial.  Aortic valve sclerosis/calcification is present, without any evidence of aortic stenosis. Aortic valve area, by VTI measures 2.96 cm. Aortic valve mean gradient measures 3.0 mmHg. Aortic valve Vmax measures 1.24 m/s.  8. Aortic dilatation noted. There is borderline dilatation of the ascending aorta, measuring 38 mm.  9. The inferior vena cava is dilated in size with <50% respiratory variability,  suggesting right atrial pressure of 15 mmHg. FINDINGS  Left Ventricle: Left ventricular ejection fraction, by estimation, is 60 to 65%. The left ventricle has normal function. The left ventricle has no regional wall motion abnormalities. The left ventricular internal cavity size was normal in size. There is  mild concentric left ventricular hypertrophy. Left ventricular diastolic function could not be evaluated due to atrial fibrillation. Left ventricular diastolic function could not be evaluated. Right Ventricle: The right ventricular size is normal. No increase in right ventricular wall thickness. Right ventricular systolic function is normal. There is normal pulmonary artery systolic pressure. The tricuspid regurgitant velocity is 2.27 m/s, and  with an assumed right atrial pressure of 15 mmHg, the estimated right ventricular systolic pressure is 24.4 mmHg. Left Atrium: Left atrial size was moderately dilated. Right Atrium: Previously noted mobile mass in the RA not visible on this study. Right atrial size was mildly dilated. Pericardium: There is no evidence of pericardial effusion. Mitral Valve: The mitral valve is abnormal. There is mild thickening of the mitral valve leaflet(s). Mild mitral valve regurgitation. No evidence of mitral valve stenosis. Tricuspid Valve: The tricuspid valve is normal in structure. Tricuspid valve regurgitation is mild to moderate. No evidence of tricuspid stenosis. Aortic Valve: The aortic valve is tricuspid. There is mild calcification of the aortic valve. Aortic valve regurgitation is trivial. Aortic valve sclerosis/calcification is present, without any evidence of aortic stenosis. Aortic valve mean gradient measures 3.0 mmHg. Aortic valve peak gradient measures 6.2 mmHg. Aortic valve area, by VTI measures 2.96 cm. Pulmonic Valve: The pulmonic valve was normal in structure. Pulmonic valve regurgitation is not visualized. No evidence of pulmonic stenosis. Aorta: Aortic dilatation  noted. There is borderline dilatation of the ascending aorta, measuring 38 mm. Venous: The inferior vena cava is dilated in size with less than 50% respiratory variability, suggesting right atrial pressure of 15 mmHg. IAS/Shunts: No atrial level shunt detected by color flow Doppler.  LEFT VENTRICLE PLAX 2D LVIDd:         4.10 cm   Diastology LVIDs:         2.60 cm   LV e' medial:    7.72 cm/s LV PW:         1.30 cm   LV E/e' medial:  17.1 LV IVS:        1.20 cm   LV e' lateral:   8.59 cm/s LVOT diam:     2.20 cm   LV E/e' lateral: 15.4 LV SV:         78 LV SV Index:   49 LVOT Area:     3.80 cm  RIGHT VENTRICLE             IVC RV S prime:     15.10 cm/s  IVC diam: 2.80 cm TAPSE (M-mode): 1.7 cm LEFT ATRIUM             Index        RIGHT ATRIUM           Index LA diam:        3.90 cm 2.46 cm/m   RA Area:  13.00 cm LA Vol (A2C):   71.8 ml 45.30 ml/m  RA Volume:   27.70 ml  17.49 ml/m LA Vol (A4C):   77.2 ml 48.75 ml/m LA Biplane Vol: 73.0 ml 46.09 ml/m  AORTIC VALVE AV Area (Vmax):    3.57 cm AV Area (Vmean):   3.50 cm AV Area (VTI):     2.96 cm AV Vmax:           124.00 cm/s AV Vmean:          81.800 cm/s AV VTI:            0.263 m AV Peak Grad:      6.2 mmHg AV Mean Grad:      3.0 mmHg LVOT Vmax:         116.33 cm/s LVOT Vmean:        75.400 cm/s LVOT VTI:          0.205 m LVOT/AV VTI ratio: 0.78  AORTA Ao Root diam: 3.40 cm Ao Asc diam:  3.75 cm MV E velocity: 132.00 cm/s  TRICUSPID VALVE                             TR Peak grad:   20.6 mmHg                             TR Vmax:        227.00 cm/s                              SHUNTS                             Systemic VTI:  0.20 m                             Systemic Diam: 2.20 cm Glori Bickers MD Electronically signed by Glori Bickers MD Signature Date/Time: 09/22/2022/4:24:56 PM    Final     Cardiac Studies    Echo from 09/22/22:   1. Left ventricular ejection fraction, by estimation, is 60 to 65%. The  left ventricle has normal  function. The left ventricle has no regional  wall motion abnormalities. There is mild concentric left ventricular  hypertrophy. Left ventricular diastolic  function could not be evaluated.   2. Right ventricular systolic function is normal. The right ventricular  size is normal. There is normal pulmonary artery systolic pressure.   3. Left atrial size was moderately dilated.   4. Previously noted mobile mass in the RA not visible on this study.  Right atrial size was mildly dilated.   5. The mitral valve is abnormal. Mild mitral valve regurgitation. No  evidence of mitral stenosis.   6. Tricuspid valve regurgitation is mild to moderate.   7. The aortic valve is tricuspid. There is mild calcification of the  aortic valve. Aortic valve regurgitation is trivial. Aortic valve  sclerosis/calcification is present, without any evidence of aortic  stenosis. Aortic valve area, by VTI measures 2.96  cm. Aortic valve mean gradient measures 3.0 mmHg. Aortic valve Vmax  measures 1.24 m/s.   8. Aortic dilatation noted. There is borderline dilatation of the  ascending aorta, measuring 38 mm.   9. The inferior vena cava is dilated  in size with <50% respiratory  variability, suggesting right atrial pressure of 15 mmHg.     Patient Profile     71 y.o. female with PMH of paroxysmal A-fib/flutter, ESRD on HD, history of recurrent MRSA bacteremia and infective endocarditis and discitis/osteomyelitis, hypertension, asthma, anemia of chronic disease, pulmonary nodule, who is currently admitted since 09/17/2022 for acute metabolic encephalopathy, fall at home, acute on chronic back pain, found to have acute recurrent MRSA bacteremia with progression of vertebral discitis/osteomyelitis.  Cardiology is following for paroxysmal A-fib with RVR since 09/22/2022.  Assessment & Plan    Paroxysmal A-fib with RVR -In the setting of sepsis, treat infection as guided by ID -TSH elevated 5.09 on 7/10, will repeat  today  -Echocardiogram reviewed preserved EF, no significant biventricular dysfunction or valvular disease -Weaned off diltiazem drip, started p.o. diltiazem 30 mg every 6 h and metoprolol 50 mg twice daily, rate controlled, may transition diltiazem to ER 120 mg daily  -CHA2DS2-VASc score is 3 for hypertension, age, female, will continue anticoagulation with Eliquis 2.5 mg twice daily (renally dosed due to creatinine above 1.5 and weight less than 60 kg) -Keep Mag > 4 and Potassium > 2  Acute/recurrent MRSA bacteremia with lumbar discitis/osteomyelitis and ventral epidural abscess History of infective endocarditis with right atrial mass concerning for thrombus versus vegetation -Blood culture from 09/17/2022 again positive for MRSA, repeat blood culture 09/20/2022 NTD -Echo from 08/16/2022 with LVEF 60 to 65%, stable small/mobile density 0.5 x 0.9 cm in the RVOT at the level of RCC of the aortic valve which was seen on echo 02/2022 and TEE 06/2022 -Repeat echo from 09/22/2022 with LVEF 60 to 65%, previous are a mobile mass not visible -Defer antibiotic management to ID, no TEE is recommended by ID this admission  HTN ESRD Anemia of chronic disease - per nephrology management      For questions or updates, please contact [place] Please consult [email or web address] for contact info under        Signed, Margie Billet, NP  09/23/2022, 11:44 AM    Patient seen and examined.  Agree with above documentation.  On exam, patient is alert and oriented, normal rate, irregular rhythm, no murmurs, lungs CTAB, no LE edema or JVD.  Remains in Afib but rates appear controlled, will consolidate diltiazem to '120mg'$  CD.  Continue metoprolol 50 mg BID.  Continue Eliquis.  Donato Heinz, MD

## 2022-09-23 NOTE — Progress Notes (Signed)
Subjective:  HR variable-  now on oral cardizem-  due for dialysis later today   -   she has slowed mentation this AM  Objective Vital signs in last 24 hours: Vitals:   09/22/22 2028 09/22/22 2347 09/23/22 0330 09/23/22 0800  BP: (!) 138/113 (!) 139/96 (!) 142/94 (!) 141/126  Pulse: (!) 120 (!) 112 94 97  Resp: '17 16 18 17  '$ Temp: 98.3 F (36.8 C) 98.7 F (37.1 C) 98.3 F (36.8 C) 97.8 F (36.6 C)  TempSrc: Oral Oral Oral Axillary  SpO2: 100% 100% 97% 98%  Weight:      Height:       Weight change:   Physical Exam: General: Thin elderly female NAD this a.m. lethargic Heart: RRR, no MRG Lungs: Clear anteriorly nonlabored breathing Abdomen: NABS soft, NTND no ascites Extremities: No pedal edema Dialysis Access: L UA  AVF + bruit    Home meds include - albuterol, apixaban, advair, hydralazine 100 2-3 x per day, metoprolol 50 bid, pantoprazole, sevelamer carb 2 ac tid, prns/ vits/ supps    OP HD: [place] TTS 4h   400/600  53.5kg  2/2 bath  Hep none LUA AVF - last HD 10/10 post wt 54.1kg - last Hb 10.1 - mircera 100 q2, new start  - iron sucrose 50 mg weekly - doxercalciferol 3 ug iv tts      109/100, HR 90,  RR 15  97%    K+ 5.4  BUN 42  cr 7.53   Problem/Plan Acute metabolic encephalopathy -multiple etiology MRSA bacteremia with vertebral discitis/osteomyelitis, dementia, gabapentin.   Had not really missed any HD sessions, No sig azotemia. I do not know her baseline but she does not seem to be getting better-  required many verbal cues with HD, this will be a problem in the OP HD setting.  Not sure what the dispo plan is ESRD - on HD TTS.  Usually good compliance.  Missed last Thursday, but had  Friday and Saturday to get back on schedule. Next planned for today - will see how it goes in terms of her cooperation HTN /vol - not grossly volume overloaded-  BP for the most part high-  lopressor, hydralazine and PRN clonidine -  min to mod UF with HD-  now with the wrinkle of Afib  BP is lower Progression of discitis/osteomyelitis L-spine on MRI on 09/17/2022/= ID consulting noted [facility] 10/13 1 of 4 bottles GPC, MRSA per [facility] ID on vancomycin, TTE ordered by ID and neurosurgery to eval repeat blood cultures 10/15 ordered/NS consulted noted no surgery needed currently-  will need vand 500 mg per HD until 12/11 Rapid A-fib IV Cardizem drip started by admit team 10/13 evening.  Recurred again last night-  rate controlled this AM Anemia esrd - On ESA weekly-  will inc dose. Fe IV weekly held in setting of infection MBD ckd - CCa elevated, holding IV vdra. Cont renvela as binder.  Phos dropping-  will watch -  may need to hold   [facility]  09/23/2022,8:34 AM  LOS: 6 days   Labs: Basic Metabolic Panel: Recent Labs  Lab 09/20/22 0435 09/22/22 0349 09/23/22 0320  NA 135 135 135  K 3.8 3.9 4.3  CL 97* 95* 96*  CO2 '27 27 25  '$ GLUCOSE 95 105* 103*  BUN 27* 28* 36*  CREATININE 5.15* 5.22* 6.42*  CALCIUM 9.8 10.4* 10.3  PHOS 5.0* 3.9 5.2*   Liver Function Tests:  Recent Labs  Lab 09/16/22 2242 09/18/22 0721 09/20/22 0435 09/22/22 0349 09/23/22 0320  AST 14*  --   --   --   --   ALT 10  --   --   --   --   ALKPHOS 40  --   --   --   --   BILITOT 0.7  --   --   --   --   PROT 6.5  --   --   --   --   ALBUMIN 2.9*   < > 2.3* 2.4* 2.5*   < > = values in this interval not displayed.   No results for input(s): "LIPASE", "AMYLASE" in the last 168 hours. Recent Labs  Lab 09/17/22 0134  AMMONIA 24   CBC: Recent Labs  Lab 09/18/22 0546 09/19/22 0749 09/20/22 0435 09/22/22 0349 09/23/22 0320  WBC 5.5 5.0 3.9* 3.5* 3.9*  NEUTROABS 4.0 3.8 2.3 1.9 2.1  HGB 10.7* 8.8* 9.0* 8.7* 9.1*  HCT 31.4* 26.3* 26.1* 26.2* 26.5*  MCV 99.1 100.8* 97.8 100.0 98.5  PLT 109* 120* 133* 179 189   Cardiac Enzymes: No results for input(s): "CKTOTAL", "CKMB", "CKMBINDEX", "TROPONINI" in the last 168 hours. CBG: No results for input(s):  "GLUCAP" in the last 168 hours.    vancomycin Stopped (09/21/22 2140)    apixaban  2.5 mg Oral BID   Chlorhexidine Gluconate Cloth  6 each Topical Q0600   Chlorhexidine Gluconate Cloth  6 each Topical Q0600   [START ON 09/28/2022] darbepoetin (ARANESP) injection - DIALYSIS  200 mcg Intravenous Q Tue-HD   diltiazem  30 mg Oral Q6H   hydrALAZINE  100 mg Oral 2 times per day on Tue Thu Sat   hydrALAZINE  100 mg Oral 3 times per day on Sun Mon Wed Fri   hydrocortisone cream   Topical TID   metoprolol tartrate  50 mg Oral BID   mometasone-formoterol  2 puff Inhalation BID   pantoprazole  40 mg Oral BID   sevelamer carbonate  1,600 mg Oral TID WC

## 2022-09-23 NOTE — Progress Notes (Signed)
Was watching pt while her primary HD nurse was at lunch break. Dialysis tech came to me saying pt was asking for tylenol. I took the tylenol to the pt and asked the pt where her pain was. She said the pain was in her chest. I asked her to quantify the pain and she rated her pain a 10. I asked her to qualify her pain and she said, "I don't know, ive never had this kind of pain before". Tylenol was admin. I contacted Penninger, PA to make her aware. Asked if she wanted an EKG and troponin ordered. 12 lead EKG ordered. It showed Afib RVR. PA continued to look into pt's chart and then came to me and asked me to call pt's primary MD/attending to make him aware incase he wanted to order interventions. I spoke w/ Dr. Algis Liming and he told me to call the Cardiology team that rounded on her today. Cardiology paged from [facility] and Dr. Sallyanne Kuster said that he would let Dr. Gardiner Rhyme know as that is the MD following her today. Pt's primary HD nurse will continue to monitor this situation while the patient is in HD.

## 2022-09-23 NOTE — Progress Notes (Signed)
PROGRESS NOTE   Diamond [name]  GEZ:[id number]    DOB: 03-Jul-1951    DOA: 09/16/2022  PCP: [name], MD   I have briefly reviewed patients previous medical records in [facility].  Chief Complaint  Patient presents with   Fall   Back Pain    Brief Narrative:  71 y.o. female with medical history significant of ESRD on HD, PAF on eliquis, HTN, h/o recurrent MRSA bacteremia and vertebral discitis, presents the ED following a fall at home and some acute on chronic back pain.  Patient also noted to be intermittently confused, reported missing a dialysis session due to back pain as per patient.  In the ED, work-up for any traumatic injury was negative including CT head/spine.  While in the ED, patient noted to spike a temp, blood cultures were taken and patient was started on IV vancomycin and cefepime.  Of note, patient recently finished 4 weeks of IV ABx (looks like vanc) 8/8.  Readmitted 8/22 for acute on chronic back pain with MRI concerning for l2-3 paravertebral edema and still elevated crp.  Treated with another 4 weeks of Bactrim PO.  Due to history, ID was consulted, as well as nephrology for HD.  Patient admitted for further management.   Assessment & Plan:  Principal Problem:   Acute metabolic encephalopathy Active Problems:   Hypertensive urgency   ESRD on hemodialysis (HCC)   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    Paroxysmal atrial flutter (HCC)   Thyroid nodule   Empty sella (HCC)   PAF (paroxysmal atrial fibrillation) (HCC)   MRSA bacteremia with lumbar discitis/osteomyelitis and ventral epidural abscess and acute on chronic back pain Had a temp spike, with no leukocytosis BC x2 (10/13) growing MRSA.  Surveillance blood cultures x2 from 10/16: Negative to date. Procalcitonin 0.62, CRP elevated but decreasing, 27.4 > 19.3 > 22.3 > 11.8.  ESR: 98. UA negative Chest x-ray unremarkable Repeat Echo not done as patient refused and  moreover echo 1 month ago with preserved function and no vegetations and ID suspects source is still lumbar discitis/osteomyelitis. CT lumbar spine without contrast unremarkable for any fracture or collection MRI lumbar spine showed progression of discitis with increased soft tissue extent of infection in the paraspinal and epidural spaces ID consulted, plan for 8 weeks of IV Vancomycin post HD. Neurosurgery/IR consulted no further work-up recommended Pain management and appears to be controlled. PT/OT- rec SNF, fall precautions   Hypertensive urgency Cont home PO hydralazine, lopressor, clonidine as needed.  Better controlled now/stable..   Acute metabolic encephalopathy ??  Dementia, noted to be sundowning towards the end of the day Family reports some sundowning as well at home towards evening May be due to HTN encephalopathy vs infection Management as above.  Delirium precautions.  Ongoing confusion.  Discontinued Percocet and use nonopioids for pain management.   ESRD on hemodialysis ([place]) 1 missed dialysis session PTA Nephrology on board for HD needs.  Had HD 10/17 afternoon, not much fluid taken off but went into rapid A-fib requiring Cardizem.  Again developed A-fib with mild RVR at HD on 10/19, cardiology evaluated, HD cut short, starting amiodarone drip.  Anemia of ESRD/chronic disease Stable.  Leukopenia and thrombocytopenia: Thrombocytopenia resolved.  Leukopenia stable.?  Related to infection or antimicrobials.   Paroxysmal atrial flutter (HCC)/A-fib with RVR Noted to be in A-fib with RVR on 09/17/2022, s/p Cardizem drip Cont home metoprolol Cont home eliquis Post HD on 10/17, went into A-fib with  RVR up to 160s, started on Cardizem drip and now controlled ventricular rate on 10 mg. The RVR appears to be a recurrent issue, may be precipitated by ongoing acute infection.  Consulted cardiology for assistance.  Cardizem was switched from p.o. short acting yesterday and  consolidated to CD this morning. Again this afternoon, developed A-fib with mild RVR at HD on 10/19, cardiology evaluated, HD cut short, starting amiodarone drip.  Report of chest pain but unable to verify given her confusion and cardiology feels that this is related to palpitations rather than true chest pain.  EKG shows A-fib with RVR of 110/min, right BBB morphology but otherwise no acute changes. Follows with [name], [place].   History of MRSA bacteremia and discitis  Pt finished 4 weeks IV Abx, followed by 4 more weeks of bactrim for discitis Followed by ID as an outpatient   Empty sella ([place]) Seen on CT Pt denies headache, recent visual changes Monitor   Thyroid nodule Thyroid ultrasound showed normal-sized thyroid with bilateral nodules, does not meet any criteria for biopsy    Body mass index is 18.72 kg/m.   DVT prophylaxis: apixaban (ELIQUIS) tablet 2.5 mg Start: 09/17/22 0315     Code Status: Full Code:  Family Communication: None at bedside Disposition:  Status is: Inpatient Remains inpatient appropriate because: Bacteremia on IV antibiotics pending surveillance blood cultures to be negative, A-fib with RVR on Cardizem drip     Consultants:   Nephrology Infectious disease Neurosurgery Cardiology  Procedures:     Antimicrobials:   As above   Subjective:  Seen this morning.  RN at bedside.  Lethargic/somewhat somnolent but arousable, oriented to self, place and partly to time.  Denied complaints.  Specifically denied pain.  Objective:   Vitals:   09/23/22 1630 09/23/22 1700 09/23/22 1727 09/23/22 1744  BP: (!) 139/93 (!) 146/91  (!) 134/54  Pulse: (!) 109 (!) 122  (!) 116  Resp: _0 Temp:    98 F (36.7 C)  TempSrc:    Oral  SpO2:      Weight:    52.6 kg  Height:        General exam: Elderly female, moderately built, frail and chronically ill looking sitting up comfortably in bed without distress. Respiratory system:  Clear to auscultation.  No increased work of breathing. Cardiovascular system: S1 & S2 heard, irregular irregular. No JVD, murmurs, rubs, gallops or clicks. No pedal edema.  Telemetry personally reviewed this morning: A-fib with controlled ventricular rate in the 90- 100s.  Afternoon events as noted above. Gastrointestinal system: Abdomen is nondistended, soft and nontender. No organomegaly or masses felt. Normal bowel sounds heard. Central nervous system: Lethargic/somnolent but arousable and oriented as noted above. No focal neurological deficits. Extremities: Symmetric 5 x 5 power.  Left upper arm AV fistula. Skin: No rashes, lesions or ulcers Psychiatry: Judgement and insight appear impaired.  Mood & affect appropriate.     Data Reviewed:   I have personally reviewed following labs and imaging studies   CBC: Recent Labs  Lab 09/20/22 0435 09/22/22 0349 09/23/22 0320  WBC 3.9* 3.5* 3.9*  NEUTROABS 2.3 1.9 2.1  HGB 9.0* 8.7* 9.1*  HCT 26.1* 26.2* 26.5*  MCV 97.8 100.0 98.5  PLT 133* 179 235    Basic Metabolic Panel: Recent Labs  Lab 09/18/22 0721 09/19/22 0749 09/20/22 0435 09/22/22 0349 09/23/22 0320  NA 136 134* 135 135 135  K 3.8 3.7 3.8 3.9 4.3  CL 95* 96* 97* 95* 96*  CO2 _0 GLUCOSE 81 94 95 105* 103*  BUN 23 16 27* 28* 36*  CREATININE 4.72* 3.56* 5.15* 5.22* 6.42*  CALCIUM 9.8 9.8 9.8 10.4* 10.3  MG  --   --   --  1.7  --   PHOS 4.6 4.6 5.0* 3.9 5.2*    Liver Function Tests: Recent Labs  Lab 09/16/22 2242 09/18/22 0721 09/19/22 0749 09/20/22 0435 09/22/22 0349 09/23/22 0320  AST 14*  --   --   --   --   --   ALT 10  --   --   --   --   --   ALKPHOS 40  --   --   --   --   --   BILITOT 0.7  --   --   --   --   --   PROT 6.5  --   --   --   --   --   ALBUMIN 2.9* 2.9* 2.4* 2.3* 2.4* 2.5*    CBG: No results for input(s): "GLUCAP" in the last 168 hours.  Microbiology Studies:   Recent Results (from the past 240 hour(s))  Culture,  blood (Routine X 2) w Reflex to ID Panel     Status: Abnormal   Collection Time: 09/17/22  7:58 AM   Specimen: BLOOD RIGHT ARM  Result Value Ref Range Status   Specimen Description BLOOD RIGHT ARM  Final   Special Requests   Final    BOTTLES DRAWN AEROBIC AND ANAEROBIC Blood Culture adequate volume   Culture  Setup Time   Final    GRAM POSITIVE COCCI IN CLUSTERS ANAEROBIC BOTTLE ONLY CRITICAL RESULT CALLED TO, READ BACK BY AND VERIFIED WITH: T RUDISILL,[email or web address]_1  09/18/22 [place] Performed at [facility], [address] [place], [place] [address]    Culture METHICILLIN RESISTANT STAPHYLOCOCCUS AUREUS (A)  Final   Report Status 09/20/2022 FINAL  Final   Organism ID, Bacteria METHICILLIN RESISTANT STAPHYLOCOCCUS AUREUS  Final      Susceptibility   Methicillin resistant staphylococcus aureus - MIC*    CIPROFLOXACIN <=0.5 SENSITIVE Sensitive     ERYTHROMYCIN <=0.25 SENSITIVE Sensitive     GENTAMICIN <=0.5 SENSITIVE Sensitive     OXACILLIN RESISTANT Resistant     TETRACYCLINE >=16 RESISTANT Resistant     VANCOMYCIN 1 SENSITIVE Sensitive     TRIMETH/SULFA <=10 SENSITIVE Sensitive     CLINDAMYCIN <=0.25 SENSITIVE Sensitive     RIFAMPIN <=0.5 SENSITIVE Sensitive     Inducible Clindamycin NEGATIVE Sensitive     * METHICILLIN RESISTANT STAPHYLOCOCCUS AUREUS  Blood Culture ID Panel (Reflexed)     Status: Abnormal   Collection Time: 09/17/22  7:58 AM  Result Value Ref Range Status   Enterococcus faecalis NOT DETECTED NOT DETECTED Final   Enterococcus Faecium NOT DETECTED NOT DETECTED Final   Listeria monocytogenes NOT DETECTED NOT DETECTED Final   Staphylococcus species DETECTED (A) NOT DETECTED Final    Comment: CRITICAL RESULT CALLED TO, READ BACK BY AND VERIFIED WITH: T RUDISILL,PHARMD_2  09/18/22 [place]    Staphylococcus aureus (BCID) DETECTED (A) NOT DETECTED Final    Comment: Methicillin (oxacillin)-resistant Staphylococcus aureus (MRSA). MRSA is predictably resistant to  beta-lactam antibiotics (except ceftaroline). Preferred therapy is vancomycin unless clinically contraindicated. Patient requires contact precautions if  hospitalized. CRITICAL RESULT CALLED TO, READ BACK BY AND VERIFIED WITH: T RUDISILL,PHARMD_3  09/18/22 [place]    Staphylococcus epidermidis NOT DETECTED NOT DETECTED  Final   Staphylococcus lugdunensis NOT DETECTED NOT DETECTED Final   Streptococcus species NOT DETECTED NOT DETECTED Final   Streptococcus agalactiae NOT DETECTED NOT DETECTED Final   Streptococcus pneumoniae NOT DETECTED NOT DETECTED Final   Streptococcus pyogenes NOT DETECTED NOT DETECTED Final   A.calcoaceticus-baumannii NOT DETECTED NOT DETECTED Final   Bacteroides fragilis NOT DETECTED NOT DETECTED Final   Enterobacterales NOT DETECTED NOT DETECTED Final   Enterobacter cloacae complex NOT DETECTED NOT DETECTED Final   Escherichia coli NOT DETECTED NOT DETECTED Final   Klebsiella aerogenes NOT DETECTED NOT DETECTED Final   Klebsiella oxytoca NOT DETECTED NOT DETECTED Final   Klebsiella pneumoniae NOT DETECTED NOT DETECTED Final   Proteus species NOT DETECTED NOT DETECTED Final   Salmonella species NOT DETECTED NOT DETECTED Final   Serratia marcescens NOT DETECTED NOT DETECTED Final   Haemophilus influenzae NOT DETECTED NOT DETECTED Final   Neisseria meningitidis NOT DETECTED NOT DETECTED Final   Pseudomonas aeruginosa NOT DETECTED NOT DETECTED Final   Stenotrophomonas maltophilia NOT DETECTED NOT DETECTED Final   Candida albicans NOT DETECTED NOT DETECTED Final   Candida auris NOT DETECTED NOT DETECTED Final   Candida glabrata NOT DETECTED NOT DETECTED Final   Candida krusei NOT DETECTED NOT DETECTED Final   Candida parapsilosis NOT DETECTED NOT DETECTED Final   Candida tropicalis NOT DETECTED NOT DETECTED Final   Cryptococcus neoformans/gattii NOT DETECTED NOT DETECTED Final   Meth resistant mecA/C and MREJ DETECTED (A) NOT DETECTED Final    Comment: CRITICAL RESULT  CALLED TO, READ BACK BY AND VERIFIED WITH: T RUDISILL,PHARMD_0  09/18/22 [place] Performed at [facility], [address], [place], [place] [address]   Culture, blood (Routine X 2) w Reflex to ID Panel     Status: None   Collection Time: 09/17/22  8:03 AM   Specimen: BLOOD RIGHT HAND  Result Value Ref Range Status   Specimen Description BLOOD RIGHT HAND  Final   Special Requests   Final    BOTTLES DRAWN AEROBIC ONLY Blood Culture results may not be optimal due to an inadequate volume of blood received in culture bottles   Culture   Final    NO GROWTH 5 DAYS Performed at [facility], [place] [address] [place], [place] [address]    Report Status 09/22/2022 FINAL  Final  Culture, blood (Routine X 2) w Reflex to ID Panel     Status: None (Preliminary result)   Collection Time: 09/20/22  3:02 PM   Specimen: BLOOD RIGHT HAND  Result Value Ref Range Status   Specimen Description BLOOD RIGHT HAND  Final   Special Requests   Final    BOTTLES DRAWN AEROBIC AND ANAEROBIC Blood Culture results may not be optimal due to an inadequate volume of blood received in culture bottles   Culture   Final    NO GROWTH 3 DAYS Performed at [facility], [place] [address], [place], [place] [address]    Report Status PENDING  Incomplete  Culture, blood (Routine X 2) w Reflex to ID Panel     Status: None (Preliminary result)   Collection Time: 09/20/22  3:02 PM   Specimen: BLOOD RIGHT HAND  Result Value Ref Range Status   Specimen Description BLOOD RIGHT HAND  Final   Special Requests   Final    BOTTLES DRAWN AEROBIC AND ANAEROBIC Blood Culture adequate volume   Culture   Final    NO GROWTH 3 DAYS Performed at [facility], [place] [address] [place], [place] [address]    Report Status PENDING  Incomplete    Radiology Studies:  ECHOCARDIOGRAM COMPLETE  Result Date: 09/22/2022    ECHOCARDIOGRAM REPORT   Patient Name:   TRACE CEDERBERG Date of Exam: 09/22/2022 Medical Rec #:  [id number]       Height:       66.0 in Accession #:    [id number]     Weight:       115.5 lb Date of Birth:  1950-12-18     BSA:          1.584 m Patient Age:    54 years       BP:           120/81 mmHg Patient Gender: F              HR:           67 bpm. Exam Location:  Inpatient Procedure: 2D Echo, Color Doppler and Cardiac Doppler Indications:    Bacteremia  History:        Patient has prior history of Echocardiogram examinations, most                 recent 08/16/2022. Arrythmias:Atrial Flutter; Risk                 Factors:Hypertension.  Sonographer:    Ronny Flurry Referring Phys: [id number]  1. Left ventricular ejection fraction, by estimation, is 60 to 65%. The left ventricle has normal function. The left ventricle has no regional wall motion abnormalities. There is mild concentric left ventricular hypertrophy. Left ventricular diastolic function could not be evaluated.  2. Right ventricular systolic function is normal. The right ventricular size is normal. There is normal pulmonary artery systolic pressure.  3. Left atrial size was moderately dilated.  4. Previously noted mobile mass in the RA not visible on this study. Right atrial size was mildly dilated.  5. The mitral valve is abnormal. Mild mitral valve regurgitation. No evidence of mitral stenosis.  6. Tricuspid valve regurgitation is mild to moderate.  7. The aortic valve is tricuspid. There is mild calcification of the aortic valve. Aortic valve regurgitation is trivial. Aortic valve sclerosis/calcification is present, without any evidence of aortic stenosis. Aortic valve area, by VTI measures 2.96 cm. Aortic valve mean gradient measures 3.0 mmHg. Aortic valve Vmax measures 1.24 m/s.  8. Aortic dilatation noted. There is borderline dilatation of the ascending aorta, measuring 38 mm.  9. The inferior vena cava is dilated in size with <50% respiratory variability, suggesting right atrial pressure of 15 mmHg. FINDINGS  Left Ventricle:  Left ventricular ejection fraction, by estimation, is 60 to 65%. The left ventricle has normal function. The left ventricle has no regional wall motion abnormalities. The left ventricular internal cavity size was normal in size. There is  mild concentric left ventricular hypertrophy. Left ventricular diastolic function could not be evaluated due to atrial fibrillation. Left ventricular diastolic function could not be evaluated. Right Ventricle: The right ventricular size is normal. No increase in right ventricular wall thickness. Right ventricular systolic function is normal. There is normal pulmonary artery systolic pressure. The tricuspid regurgitant velocity is 2.27 m/s, and  with an assumed right atrial pressure of 15 mmHg, the estimated right ventricular systolic pressure is 13.2 mmHg. Left Atrium: Left atrial size was moderately dilated. Right Atrium: Previously noted mobile mass in the RA not visible on this study. Right atrial size  was mildly dilated. Pericardium: There is no evidence of pericardial effusion. Mitral Valve: The mitral valve is abnormal. There is mild thickening of the mitral valve leaflet(s). Mild mitral valve regurgitation. No evidence of mitral valve stenosis. Tricuspid Valve: The tricuspid valve is normal in structure. Tricuspid valve regurgitation is mild to moderate. No evidence of tricuspid stenosis. Aortic Valve: The aortic valve is tricuspid. There is mild calcification of the aortic valve. Aortic valve regurgitation is trivial. Aortic valve sclerosis/calcification is present, without any evidence of aortic stenosis. Aortic valve mean gradient measures 3.0 mmHg. Aortic valve peak gradient measures 6.2 mmHg. Aortic valve area, by VTI measures 2.96 cm. Pulmonic Valve: The pulmonic valve was normal in structure. Pulmonic valve regurgitation is not visualized. No evidence of pulmonic stenosis. Aorta: Aortic dilatation noted. There is borderline dilatation of the ascending aorta,  measuring 38 mm. Venous: The inferior vena cava is dilated in size with less than 50% respiratory variability, suggesting right atrial pressure of 15 mmHg. IAS/Shunts: No atrial level shunt detected by color flow Doppler.  LEFT VENTRICLE PLAX 2D LVIDd:         4.10 cm   Diastology LVIDs:         2.60 cm   LV e' medial:    7.72 cm/s LV PW:         1.30 cm   LV E/e' medial:  17.1 LV IVS:        1.20 cm   LV e' lateral:   8.59 cm/s LVOT diam:     2.20 cm   LV E/e' lateral: 15.4 LV SV:         78 LV SV Index:   49 LVOT Area:     3.80 cm  RIGHT VENTRICLE             IVC RV S prime:     15.10 cm/s  IVC diam: 2.80 cm TAPSE (M-mode): 1.7 cm LEFT ATRIUM             Index        RIGHT ATRIUM           Index LA diam:        3.90 cm 2.46 cm/m   RA Area:     13.00 cm LA Vol (A2C):   71.8 ml 45.30 ml/m  RA Volume:   27.70 ml  17.49 ml/m LA Vol (A4C):   77.2 ml 48.75 ml/m LA Biplane Vol: 73.0 ml 46.09 ml/m  AORTIC VALVE AV Area (Vmax):    3.57 cm AV Area (Vmean):   3.50 cm AV Area (VTI):     2.96 cm AV Vmax:           124.00 cm/s AV Vmean:          81.800 cm/s AV VTI:            0.263 m AV Peak Grad:      6.2 mmHg AV Mean Grad:      3.0 mmHg LVOT Vmax:         116.33 cm/s LVOT Vmean:        75.400 cm/s LVOT VTI:          0.205 m LVOT/AV VTI ratio: 0.78  AORTA Ao Root diam: 3.40 cm Ao Asc diam:  3.75 cm MV E velocity: 132.00 cm/s  TRICUSPID VALVE  TR Peak grad:   20.6 mmHg                             TR Vmax:        227.00 cm/s                              SHUNTS                             Systemic VTI:  0.20 m                             Systemic Diam: 2.20 cm Glori Bickers MD Electronically signed by Glori Bickers MD Signature Date/Time: 09/22/2022/4:24:56 PM    Final     Scheduled Meds:    amiodarone  150 mg Intravenous Once   apixaban  2.5 mg Oral BID   Chlorhexidine Gluconate Cloth  6 each Topical Q0600   Chlorhexidine Gluconate Cloth  6 each Topical Q0600   [START ON  09/28/2022] darbepoetin (ARANESP) injection - DIALYSIS  200 mcg Intravenous Q Tue-HD   hydrALAZINE  100 mg Oral 2 times per day on Tue Thu Sat   hydrALAZINE  100 mg Oral 3 times per day on Sun Mon Wed Fri   hydrocortisone cream   Topical TID   metoprolol tartrate  50 mg Oral BID   mometasone-formoterol  2 puff Inhalation BID   pantoprazole  40 mg Oral BID   sevelamer carbonate  1,600 mg Oral TID WC    Continuous Infusions:    amiodarone     Followed by   amiodarone     vancomycin Stopped (09/21/22 2140)     LOS: 6 days     Vernell Leep, MD,  FACP, [facility], [facility], [facility], [place]     To contact the attending provider between 7A-7P or the covering provider during after hours 7P-7A, please log into the web site [email or web address] and access using universal [place] password for that web site. If you do not have the password, please call the hospital operator.  09/23/2022, 5:57 PM

## 2022-09-23 NOTE — Progress Notes (Signed)
Physical Therapy Treatment Patient Details Name: Diamond [name] MRN: [id number] DOB: 1951/08/12 Today's Date: 09/23/2022   History of Present Illness The pt is a 71 yo female presenting 10/12 with AMS after missing x2 HD treatments and sustaining a mechanical fall at home. Pt found to have HTN emergency, and MRI showed progression of discitis/osteomyelitis at L2-3 and L3-4 with stable compression fx of L2, L3, L4, and L5. Neurosurgery recommends medical management, non-op. PMH includes: anemia, asthma, atrial flutter, ESRD on HD, gout, and HTN.    PT Comments    Pt lethargic today and reports low back pain and resistive to all mobility. Pt in wet bed with wet gown, assisted pt into sitting position with max A. Pt not keeping back precautions in sitting with flexed posture despite verbal and tactile cues. Stood with max A but pt with minimal participation and was adamant that she did not want to transfer to recliner or work on further standing activity. PT will continue to follow if pt begins to make progress.      Recommendations for follow up therapy are one component of a multi-disciplinary discharge planning process, led by the attending physician.  Recommendations may be updated based on patient status, additional functional criteria and insurance authorization.  Follow Up Recommendations  Skilled nursing-short term rehab (<3 hours/day) Can patient physically be transported by private vehicle: No   Assistance Recommended at Discharge Frequent or constant Supervision/Assistance  Patient can return home with the following Two people to help with walking and/or transfers;Two people to help with bathing/dressing/bathroom;Assistance with cooking/housework;Assistance with feeding;Direct supervision/assist for medications management;Direct supervision/assist for financial management;Assist for transportation;Help with stairs or ramp for entrance   Equipment Recommendations  None recommended by  PT    Recommendations for Other Services OT consult     Precautions / Restrictions Precautions Precautions: Fall Precaution Comments: could not recall Required Braces or Orthoses: Spinal Brace Spinal Brace: Lumbar corset (not in room. Was given in August. Unsure if still needs to wear) Other Brace: Spinal precautions for comfort Restrictions Weight Bearing Restrictions: No     Mobility  Bed Mobility Overal bed mobility: Needs Assistance Bed Mobility: Sidelying to Sit, Sit to Sidelying, Rolling Rolling: Max assist Sidelying to sit: Max assist     Sit to sidelying: Max assist General bed mobility comments: pt assisting minimally and at times seemed to be resistive, not fully understanding the goal to get up and get wet bed changed    Transfers Overall transfer level: Needs assistance Equipment used:  (face to face transfer) Transfers: Sit to/from Stand Sit to Stand: Total assist, +2 safety/equipment           General transfer comment: pt refused recliner. Face to face sit>stand with max A for power up, could not maintain more than 10 secs    Ambulation/Gait               General Gait Details: unable at this time   Stairs             Wheelchair Mobility    Modified Rankin (Stroke Patients Only)       Balance Overall balance assessment: Needs assistance Sitting-balance support: Bilateral upper extremity supported, Feet supported Sitting balance-Leahy Scale: Poor Sitting balance - Comments: fwd lean with elbows on knees, cued and given manual facilitation to help her sit up and to maintain back precautions but she was resistive     Standing balance-Leahy Scale: Zero Standing balance comment: dependent on significant external support  Cognition Arousal/Alertness: Lethargic Behavior During Therapy: Flat affect Overall Cognitive Status: Difficult to assess Area of Impairment: Following commands, Awareness,  Problem solving, Attention, Orientation, Memory, Safety/judgement                 Orientation Level: Situation, Time (needs assist to orient to day of week and situation) Current Attention Level: Sustained Memory: Decreased recall of precautions, Decreased short-term memory Following Commands: Follows one step commands inconsistently Safety/Judgement: Decreased awareness of safety, Decreased awareness of deficits Awareness: Intellectual Problem Solving: Slow processing, Requires verbal cues, Requires tactile cues, Decreased initiation, Difficulty sequencing General Comments: pt lethargic with minimal verbalization except to say that her back hurt and she wanted to lie back down and not get OOB        Exercises      General Comments General comments (skin integrity, edema, etc.): VSS on RA. Pt repositioned in bed for better back posture on clean, dry sheets with clean gown. RN notified for new purewick as everything was wet upon PT entry      Pertinent Vitals/Pain Pain Assessment Pain Assessment: Faces Faces Pain Scale: Hurts whole lot Pain Location: low back Pain Descriptors / Indicators: Grimacing, Guarding Pain Intervention(s): Limited activity within patient's tolerance, Monitored during session    Home Living                          Prior Function            PT Goals (current goals can now be found in the care plan section) Acute Rehab PT Goals Patient Stated Goal: lie back down PT Goal Formulation: With patient/family Time For Goal Achievement: 10/03/22 Potential to Achieve Goals: Fair Progress towards PT goals: Not progressing toward goals - comment (lethargy, mental status)    Frequency    Min 2X/week      PT Plan Current plan remains appropriate    Co-evaluation              AM-PAC PT "6 Clicks" Mobility   Outcome Measure  Help needed turning from your back to your side while in a flat bed without using bedrails?: Total Help  needed moving from lying on your back to sitting on the side of a flat bed without using bedrails?: Total Help needed moving to and from a bed to a chair (including a wheelchair)?: Total Help needed standing up from a chair using your arms (e.g., wheelchair or bedside chair)?: Total Help needed to walk in hospital room?: Total Help needed climbing 3-5 steps with a railing? : Total 6 Click Score: 6    End of Session   Activity Tolerance: Patient limited by lethargy Patient left: in bed;with call bell/phone within reach;with bed alarm set Nurse Communication: Mobility status PT Visit Diagnosis: Unsteadiness on feet (R26.81);Other abnormalities of gait and mobility (R26.89);Muscle weakness (generalized) (M62.81)     Time: 2440-1027 PT Time Calculation (min) (ACUTE ONLY): 23 min  Charges:  $Therapeutic Activity: 23-37 mins                     Leighton Roach, PT  Acute Rehab Services Secure chat preferred Office [place] 09/23/2022, 2:14 PM

## 2022-09-23 NOTE — TOC Progression Note (Signed)
Transition of Care [facility]) - Progression Note    Patient Details  Name: CHASTELYN ATHENS MRN: [id number] Date of Birth: 1951/09/13  Transition of Care [facility]) CM/SW [place], [place] Phone Number: 09/23/2022, 4:20 PM  Clinical Narrative:   CSW spoke with patient's daughter, [name], and son, Marchia Bond, to discuss SNF options. Both in agreement with [facility]. CSW confirmed bed availability with [facility]. CSW sent request to initiate insurance authorization. CSW to follow.    Expected Discharge Plan: [place] Barriers to Discharge: Continued Medical Work up, SNF Pending bed offer, Ship broker  Expected Discharge Plan and Services Expected Discharge Plan: [place] Choice: [place] Living arrangements for the past 2 months: Single Family Home                                       Social Determinants of Health (SDOH) Interventions    Readmission Risk Interventions    06/30/2022    1:12 PM 06/17/2022    1:02 PM 01/22/2022   12:19 PM  Readmission Risk Prevention Plan  Transportation Screening Complete Complete Complete  Medication Review Press photographer) Complete Complete Complete  PCP or Specialist appointment within 3-5 days of discharge Complete Complete Complete  HRI or Home Care Consult Complete Complete Complete  SW Recovery Care/Counseling Consult Complete Complete Complete  Palliative Care Screening Not Applicable Not Applicable Not [place] Not Applicable Complete Not Applicable

## 2022-09-23 NOTE — Progress Notes (Signed)
HD tx d/c with 1.75hrs left due to change in HR Cardiology MD seen ordered pt to stop dialysis for now return to unit for cardiac medication tx as ordered primary RN [facility] notified.

## 2022-09-23 NOTE — Progress Notes (Addendum)
Called to HD for Afib with RVR and chest pain.  In Afib with rates up to 130s. Patient is somnolent but when aroused states she wants to go home, oriented to person and place, reports chest pain but cannot describe, does state feels like heart is racing.  Suspect symptomatic from Afib with RVR.  Check troponins. From review of telemetry she has been in Afib since 10/17.  Has been on Eliquis.  Will start amio gtt to slow HR and hopefully convert patient back to sinus rhythm.  D/w Dr Algis Liming.  Donato Heinz, MD

## 2022-09-23 NOTE — Progress Notes (Signed)
PT IN BED STABLE FOR HD NO C/OS

## 2022-09-24 DIAGNOSIS — G9341 Metabolic encephalopathy: Secondary | ICD-10-CM | POA: Diagnosis not present

## 2022-09-24 DIAGNOSIS — B9562 Methicillin resistant Staphylococcus aureus infection as the cause of diseases classified elsewhere: Secondary | ICD-10-CM | POA: Diagnosis not present

## 2022-09-24 DIAGNOSIS — R7881 Bacteremia: Secondary | ICD-10-CM | POA: Diagnosis not present

## 2022-09-24 DIAGNOSIS — I480 Paroxysmal atrial fibrillation: Secondary | ICD-10-CM | POA: Diagnosis not present

## 2022-09-24 DIAGNOSIS — I10 Essential (primary) hypertension: Secondary | ICD-10-CM

## 2022-09-24 DIAGNOSIS — N186 End stage renal disease: Secondary | ICD-10-CM | POA: Diagnosis not present

## 2022-09-24 LAB — RENAL FUNCTION PANEL
Albumin: 2.4 g/dL — ABNORMAL LOW (ref 3.5–5.0)
Anion gap: 12 (ref 5–15)
BUN: 23 mg/dL (ref 8–23)
CO2: 27 mmol/L (ref 22–32)
Calcium: 9.7 mg/dL (ref 8.9–10.3)
Chloride: 96 mmol/L — ABNORMAL LOW (ref 98–111)
Creatinine, Ser: 4.91 mg/dL — ABNORMAL HIGH (ref 0.44–1.00)
GFR, Estimated: 9 mL/min — ABNORMAL LOW (ref >60)
Glucose, Bld: 92 mg/dL (ref 70–99)
Phosphorus: 3.7 mg/dL (ref 2.5–4.6)
Potassium: 3.9 mmol/L (ref 3.5–5.1)
Sodium: 135 mmol/L (ref 135–145)

## 2022-09-24 LAB — CBC WITH DIFFERENTIAL/PLATELET
Abs Immature Granulocytes: 0.01 10*3/uL (ref 0.00–0.07)
Basophils Absolute: 0 10*3/uL (ref 0.0–0.1)
Basophils Relative: 0 %
Eosinophils Absolute: 0.2 10*3/uL (ref 0.0–0.5)
Eosinophils Relative: 7 %
HCT: 27 % — ABNORMAL LOW (ref 36.0–46.0)
Hemoglobin: 9.2 g/dL — ABNORMAL LOW (ref 12.0–15.0)
Immature Granulocytes: 0 %
Lymphocytes Relative: 30 %
Lymphs Abs: 1.1 10*3/uL (ref 0.7–4.0)
MCH: 33.5 pg (ref 26.0–34.0)
MCHC: 34.1 g/dL (ref 30.0–36.0)
MCV: 98.2 fL (ref 80.0–100.0)
Monocytes Absolute: 0.4 10*3/uL (ref 0.1–1.0)
Monocytes Relative: 10 %
Neutro Abs: 1.9 10*3/uL (ref 1.7–7.7)
Neutrophils Relative %: 53 %
Platelets: 225 10*3/uL (ref 150–400)
RBC: 2.75 MIL/uL — ABNORMAL LOW (ref 3.87–5.11)
RDW: 17.3 % — ABNORMAL HIGH (ref 11.5–15.5)
WBC: 3.6 10*3/uL — ABNORMAL LOW (ref 4.0–10.5)
nRBC: 0 % (ref 0.0–0.2)

## 2022-09-24 MED ORDER — CHLORHEXIDINE GLUCONATE CLOTH 2 % EX PADS
6.0000 | MEDICATED_PAD | Freq: Every day | CUTANEOUS | Status: DC
  Administered 2022-09-25 – 2022-10-01 (×7): 6 via TOPICAL

## 2022-09-24 MED ORDER — AMIODARONE HCL 200 MG PO TABS
200.0000 mg | ORAL_TABLET | Freq: Two times a day (BID) | ORAL | Status: DC
  Administered 2022-09-24: 200 mg via ORAL
  Filled 2022-09-24: qty 1

## 2022-09-24 NOTE — Progress Notes (Signed)
Subjective:  HD had to be cut short due to Afib with more rapid rate yesterday -  only 300 ml removed.  Seemed to be a little clearer this AM talking to friend but then confused again-  apparently told the nurse that she wanted to stop dialysis -  is just tired of it-  nurse asked do you know what happens-  she says "ill probably die"  she also brought it up with her friend  Objective Vital signs in last 24 hours: Vitals:   09/24/22 0400 09/24/22 0456 09/24/22 0801 09/24/22 0846  BP: (!) 150/106 (!) 165/108  (!) 143/103  Pulse: 98 100  94  Resp: 16 (!) 22    Temp: 97.9 F (36.6 C) 98.2 F (36.8 C)    TempSrc: Axillary Axillary    SpO2:  98% 100%   Weight:      Height:       Weight change:   Physical Exam: General: Thin elderly female NAD this a.m. lethargic Heart: RRR, no MRG Lungs: Clear anteriorly nonlabored breathing Abdomen: NABS soft, NTND no ascites Extremities: No pedal edema Dialysis Access: L UA  AVF + bruit    Home meds include - albuterol, apixaban, advair, hydralazine 100 2-3 x per day, metoprolol 50 bid, pantoprazole, sevelamer carb 2 ac tid, prns/ vits/ supps    OP HD: [place] TTS 4h   400/600  53.5kg  2/2 bath  Hep none LUA AVF - last HD 10/10 post wt 54.1kg - last Hb 10.1 - mircera 100 q2, new start  - iron sucrose 50 mg weekly - doxercalciferol 3 ug iv tts      109/100, HR 90,  RR 15  97%    K+ 5.4  BUN 42  cr 7.53   Problem/Plan Acute metabolic encephalopathy -multiple etiology MRSA bacteremia with vertebral discitis/osteomyelitis, dementia, gabapentin.   Had not really missed any HD sessions, No sig azotemia. I do not know her baseline but she does not seem to be getting better-  required many verbal cues with HD, this will be a problem in the OP HD setting.  She seems better in the AMs-  some sundowning component ESRD - on HD TTS.  Usually good compliance.  Missed last Thursday, but had  Friday and Saturday to get back on schedule. Last 2 treatments  complicated by Afib and hypotension -  intolerance of UF-  no behavioral issues noted yesterday but that has been an issue as well.  Next planned for tomorrow - will see how it goes HTN /vol - not grossly volume overloaded-  BP for the most part high-  lopressor, hydralazine and PRN clonidine -  min to mod UF with HD-  now with the wrinkle of Afib BP is lower at times and she does not tolerate UF Progression of discitis/osteomyelitis L-spine on MRI on 09/17/2022/= ID consulting noted [facility] 10/13 1 of 4 bottles GPC, MRSA per [facility] ID on vancomycin, TTE ordered by ID and neurosurgery to eval repeat blood cultures 10/15 ordered/NS consulted noted no surgery needed currently-  will need vand 500 mg per HD until 12/11 Rapid A-fib IV Cardizem drip started by admit team 10/13 evening.  Recurred again last night-  rate controlled this AM-  now on amiodarone  Anemia esrd - On ESA weekly-  inc dose. Fe IV weekly held in setting of infection MBD ckd - CCa elevated, holding IV vdra. Cont renvela as binder.   Pt mentioned twice today is considering stopping HD-  may be worth a palliative conversation ?    [facility]  09/24/2022,8:49 AM  LOS: 7 days   Labs: Basic Metabolic Panel: Recent Labs  Lab 09/22/22 0349 09/23/22 0320 09/24/22 0344  NA 135 135 135  K 3.9 4.3 3.9  CL 95* 96* 96*  CO2 '27 25 27  '$ GLUCOSE 105* 103* 92  BUN 28* 36* 23  CREATININE 5.22* 6.42* 4.91*  CALCIUM 10.4* 10.3 9.7  PHOS 3.9 5.2* 3.7   Liver Function Tests: Recent Labs  Lab 09/22/22 0349 09/23/22 0320 09/24/22 0344  ALBUMIN 2.4* 2.5* 2.4*   No results for input(s): "LIPASE", "AMYLASE" in the last 168 hours. No results for input(s): "AMMONIA" in the last 168 hours.  CBC: Recent Labs  Lab 09/19/22 0749 09/20/22 0435 09/22/22 0349 09/23/22 0320 09/24/22 0344  WBC 5.0 3.9* 3.5* 3.9* 3.6*  NEUTROABS 3.8 2.3 1.9 2.1 1.9  HGB 8.8* 9.0* 8.7* 9.1* 9.2*  HCT 26.3* 26.1* 26.2* 26.5*  27.0*  MCV 100.8* 97.8 100.0 98.5 98.2  PLT 120* 133* 179 189 225   Cardiac Enzymes: No results for input(s): "CKTOTAL", "CKMB", "CKMBINDEX", "TROPONINI" in the last 168 hours. CBG: No results for input(s): "GLUCAP" in the last 168 hours.    amiodarone 30 mg/hr (09/24/22 0001)   vancomycin Stopped (09/21/22 2140)    apixaban  2.5 mg Oral BID   Chlorhexidine Gluconate Cloth  6 each Topical Q0600   Chlorhexidine Gluconate Cloth  6 each Topical Q0600   [START ON 09/28/2022] darbepoetin (ARANESP) injection - DIALYSIS  200 mcg Intravenous Q Tue-HD   hydrALAZINE  100 mg Oral 2 times per day on Tue Thu Sat   hydrALAZINE  100 mg Oral 3 times per day on Sun Mon Wed Fri   hydrocortisone cream   Topical TID   metoprolol tartrate  50 mg Oral BID   mometasone-formoterol  2 puff Inhalation BID   pantoprazole  40 mg Oral BID   sevelamer carbonate  1,600 mg Oral TID WC

## 2022-09-24 NOTE — TOC Progression Note (Signed)
Transition of Care [facility]) - Progression Note    Patient Details  Name: ATAVIA POPPE MRN: [id number] Date of Birth: December 14, 1950  Transition of Care [facility]) CM/SW [place], [place] Phone Number: 09/24/2022, 9:43 AM  Clinical Narrative:   CSW alerted that insurance has been approved, but patient not medically stable yet. CSW updated [facility], they can admit over the weekend if patient is stable. CSW to follow.    Expected Discharge Plan: [place] Barriers to Discharge: Continued Medical Work up  Expected Discharge Plan and Services Expected Discharge Plan: [place] Choice: [place] arrangements for the past 2 months: Single Family Home                                       Social Determinants of Health (SDOH) Interventions    Readmission Risk Interventions    06/30/2022    1:12 PM 06/17/2022    1:02 PM 01/22/2022   12:19 PM  Readmission Risk Prevention Plan  Transportation Screening Complete Complete Complete  Medication Review Press photographer) Complete Complete Complete  PCP or Specialist appointment within 3-5 days of discharge Complete Complete Complete  HRI or Home Care Consult Complete Complete Complete  SW Recovery Care/Counseling Consult Complete Complete Complete  Palliative Care Screening Not Applicable Not Applicable Not [place] Not Applicable Complete Not Applicable

## 2022-09-24 NOTE — Progress Notes (Addendum)
PROGRESS NOTE   Diamond [name]  PJS:[id number]    DOB: [birth year]    DOA: 09/16/2022  PCP: [name], MD   I have briefly reviewed patients previous medical records in [facility].  Chief Complaint  Patient presents with   Fall   Back Pain    Brief Narrative:  71 y.o. female with medical history significant of ESRD on HD, PAF on eliquis, HTN, h/o recurrent MRSA bacteremia and vertebral discitis, presents the ED following a fall at home and some acute on chronic back pain.  Patient also noted to be intermittently confused, reported missing a dialysis session due to back pain as per patient.  In the ED, work-up for any traumatic injury was negative including CT head/spine.  While in the ED, patient noted to spike a temp, blood cultures were taken and patient was started on IV vancomycin and cefepime.  Of note, patient recently finished 4 weeks of IV ABx (looks like vanc) 8/8.  Readmitted 8/22 for acute on chronic back pain with MRI concerning for l2-3 paravertebral edema and still elevated crp.  Treated with another 4 weeks of Bactrim PO.  Due to history, ID was consulted, as well as nephrology for HD.  Patient admitted for further management.   Assessment & Plan:  Principal Problem:   Acute metabolic encephalopathy Active Problems:   Hypertensive urgency   ESRD on hemodialysis (HCC)   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    Paroxysmal atrial flutter (HCC)   Thyroid nodule   Empty sella (HCC)   PAF (paroxysmal atrial fibrillation) (HCC)   MRSA bacteremia with lumbar discitis/osteomyelitis and ventral epidural abscess and acute on chronic back pain Had a temp spike, with no leukocytosis BC x2 (10/13) growing MRSA.  Surveillance blood cultures x2 from 10/16: Negative to date. Procalcitonin 0.62, CRP elevated but decreasing, 27.4 > 19.3 > 22.3 > 11.8.  ESR: 98. UA negative Chest x-ray unremarkable Repeat Echo not done as patient refused and  moreover echo 1 month ago with preserved function and no vegetations and ID suspects source is still lumbar discitis/osteomyelitis. CT lumbar spine without contrast unremarkable for any fracture or collection MRI lumbar spine showed progression of discitis with increased soft tissue extent of infection in the paraspinal and epidural spaces ID consulted, plan for 8 weeks of IV Vancomycin post HD. Neurosurgery/IR consulted no further work-up recommended Pain management and appears to be controlled. PT/OT- rec SNF, fall precautions   Hypertensive urgency Cont home PO hydralazine(alternating doses on HD and non HD days), lopressor, clonidine as needed.  May need to further titrate or adjust.   Acute metabolic encephalopathy ??  Dementia, noted to be sundowning towards the end of the day Family reports some sundowning as well at home towards evening May be due to HTN encephalopathy vs infection Management as above.  Delirium precautions.  Ongoing confusion.  Discontinued Percocet and use nonopioids for pain management. Much better this am, alert and mostly oriented but still confused.   ESRD on hemodialysis ([place]) 1 missed dialysis session PTA Nephrology on board for HD needs.  Had HD 10/17 afternoon, not much fluid taken off but went into rapid A-fib requiring Cardizem.  Again developed A-fib with mild RVR at HD on 10/19, cardiology evaluated, HD cut short, started amiodarone drip.  Anemia of ESRD/chronic disease Stable.  Leukopenia and thrombocytopenia: Thrombocytopenia resolved.  Leukopenia stable.?  Related to infection or antimicrobials.   Paroxysmal atrial flutter (HCC)/A-fib with RVR Noted to be in  A-fib with RVR on 09/17/2022, s/p Cardizem drip Cont home metoprolol Cont home eliquis Post HD on 10/17, went into A-fib with RVR up to 160s, started on Cardizem drip > changed and consolidated to Cardizem CD. Recurrent issue, developed again 10/19 afternoon at HD, developed A-fib with  mild RVR, cardiology evaluated, started amiodarone drip with hope of converting to NSR. EKG w/o acute changes. HS Trop 23>21. Now CVR. Cardiology changing to PO Amiodarone this PM. Follows with [name], [place].   History of MRSA bacteremia and discitis  Pt finished 4 weeks IV Abx, followed by 4 more weeks of bactrim for discitis Followed by ID as an outpatient   Empty sella ([place]) Seen on CT Pt denies headache, recent visual changes Monitor   Thyroid nodule Thyroid ultrasound showed normal-sized thyroid with bilateral nodules, does not meet any criteria for biopsy    Body mass index is 18.72 kg/m.   DVT prophylaxis: apixaban (ELIQUIS) tablet 2.5 mg Start: 09/17/22 0315     Code Status: Full Code:  Family Communication: Daughter via phone Disposition:  Status is: Inpatient Remains inpatient appropriate because: Bacteremia on IV antibiotics pending surveillance blood cultures to be negative, A-fib with RVR on Cardizem drip   As per discussion with daughter, at baseline patient has mild confusion and cognitive impairment.  She plans to come into the hospital maybe tomorrow and will advise [place] as to how her mother is doing as far as her mental status.  Consultants:   Nephrology Infectious disease Neurosurgery Cardiology  Procedures:     Antimicrobials:   As above   Subjective:  Looks much better this am. Alert and communicative and wants to know why she isn't being DC'ed home to be with her family. States that she lives with her son. Denies pain or complaints.  Objective:   Vitals:   09/24/22 0456 09/24/22 0801 09/24/22 0846 09/24/22 1204  BP: (!) 165/108  (!) 143/103 (!) 120/92  Pulse: 100  94 89  Resp: (!) 22   20  Temp: 98.2 F (36.8 C)   98.9 F (37.2 C)  TempSrc: Axillary   Oral  SpO2: 98% 100%  99%  Weight:      Height:        General exam: Elderly female, moderately built, frail and chronically ill , sitting comfortably in bed. Pleasant and  in good spirits. Looks much better than 10/19 Respiratory system: Clear to auscultation.  No increased work of breathing. Cardiovascular system: S1 & S2 heard, irregular irregular. No JVD, murmurs, rubs, gallops or clicks. No pedal edema.  Telemetry personally reviewed: Afib with CVR in the 90's mostly. Gastrointestinal system: Abdomen is nondistended, soft and nontender. No organomegaly or masses felt. Normal bowel sounds heard. Central nervous system: Lethargic/somnolent but arousable and oriented as noted above. No focal neurological deficits. Extremities: Symmetric 5 x 5 power.  Left upper arm AV fistula. Skin: No rashes, lesions or ulcers Psychiatry: Judgement and insight appear impaired.  Mood & affect appropriate.     Data Reviewed:   I have personally reviewed following labs and imaging studies   CBC: Recent Labs  Lab 09/22/22 0349 09/23/22 0320 09/24/22 0344  WBC 3.5* 3.9* 3.6*  NEUTROABS 1.9 2.1 1.9  HGB 8.7* 9.1* 9.2*  HCT 26.2* 26.5* 27.0*  MCV 100.0 98.5 98.2  PLT 179 189 177    Basic Metabolic Panel: Recent Labs  Lab 09/19/22 0749 09/20/22 0435 09/22/22 0349 09/23/22 0320 09/24/22 0344  NA 134* 135 135 135  135  K 3.7 3.8 3.9 4.3 3.9  CL 96* 97* 95* 96* 96*  CO2 '26 27 27 25 27  ' GLUCOSE 94 95 105* 103* 92  BUN 16 27* 28* 36* 23  CREATININE 3.56* 5.15* 5.22* 6.42* 4.91*  CALCIUM 9.8 9.8 10.4* 10.3 9.7  MG  --   --  1.7  --   --   PHOS 4.6 5.0* 3.9 5.2* 3.7    Liver Function Tests: Recent Labs  Lab 09/19/22 0749 09/20/22 0435 09/22/22 0349 09/23/22 0320 09/24/22 0344  ALBUMIN 2.4* 2.3* 2.4* 2.5* 2.4*    CBG: No results for input(s): "GLUCAP" in the last 168 hours.  Microbiology Studies:   Recent Results (from the past 240 hour(s))  Culture, blood (Routine X 2) w Reflex to ID Panel     Status: Abnormal   Collection Time: 09/17/22  7:58 AM   Specimen: BLOOD RIGHT ARM  Result Value Ref Range Status   Specimen Description BLOOD RIGHT ARM   Final   Special Requests   Final    BOTTLES DRAWN AEROBIC AND ANAEROBIC Blood Culture adequate volume   Culture  Setup Time   Final    GRAM POSITIVE COCCI IN CLUSTERS ANAEROBIC BOTTLE ONLY CRITICAL RESULT CALLED TO, READ BACK BY AND VERIFIED WITH: T RUDISILL,PHARMD'@0148'  09/18/22 [place] Performed at [facility], [address] [place], [place] [address]    Culture METHICILLIN RESISTANT STAPHYLOCOCCUS AUREUS (A)  Final   Report Status 09/20/2022 FINAL  Final   Organism ID, Bacteria METHICILLIN RESISTANT STAPHYLOCOCCUS AUREUS  Final      Susceptibility   Methicillin resistant staphylococcus aureus - MIC*    CIPROFLOXACIN <=0.5 SENSITIVE Sensitive     ERYTHROMYCIN <=0.25 SENSITIVE Sensitive     GENTAMICIN <=0.5 SENSITIVE Sensitive     OXACILLIN RESISTANT Resistant     TETRACYCLINE >=16 RESISTANT Resistant     VANCOMYCIN 1 SENSITIVE Sensitive     TRIMETH/SULFA <=10 SENSITIVE Sensitive     CLINDAMYCIN <=0.25 SENSITIVE Sensitive     RIFAMPIN <=0.5 SENSITIVE Sensitive     Inducible Clindamycin NEGATIVE Sensitive     * METHICILLIN RESISTANT STAPHYLOCOCCUS AUREUS  Blood Culture ID Panel (Reflexed)     Status: Abnormal   Collection Time: 09/17/22  7:58 AM  Result Value Ref Range Status   Enterococcus faecalis NOT DETECTED NOT DETECTED Final   Enterococcus Faecium NOT DETECTED NOT DETECTED Final   Listeria monocytogenes NOT DETECTED NOT DETECTED Final   Staphylococcus species DETECTED (A) NOT DETECTED Final    Comment: CRITICAL RESULT CALLED TO, READ BACK BY AND VERIFIED WITH: T RUDISILL,PHARMD'@0148'  09/18/22 [place]    Staphylococcus aureus (BCID) DETECTED (A) NOT DETECTED Final    Comment: Methicillin (oxacillin)-resistant Staphylococcus aureus (MRSA). MRSA is predictably resistant to beta-lactam antibiotics (except ceftaroline). Preferred therapy is vancomycin unless clinically contraindicated. Patient requires contact precautions if  hospitalized. CRITICAL RESULT CALLED TO, READ BACK BY  AND VERIFIED WITH: T RUDISILL,PHARMD'@0148'  09/18/22 [place]    Staphylococcus epidermidis NOT DETECTED NOT DETECTED Final   Staphylococcus lugdunensis NOT DETECTED NOT DETECTED Final   Streptococcus species NOT DETECTED NOT DETECTED Final   Streptococcus agalactiae NOT DETECTED NOT DETECTED Final   Streptococcus pneumoniae NOT DETECTED NOT DETECTED Final   Streptococcus pyogenes NOT DETECTED NOT DETECTED Final   A.calcoaceticus-baumannii NOT DETECTED NOT DETECTED Final   Bacteroides fragilis NOT DETECTED NOT DETECTED Final   Enterobacterales NOT DETECTED NOT DETECTED Final   Enterobacter cloacae complex NOT DETECTED NOT DETECTED Final   Escherichia  coli NOT DETECTED NOT DETECTED Final   Klebsiella aerogenes NOT DETECTED NOT DETECTED Final   Klebsiella oxytoca NOT DETECTED NOT DETECTED Final   Klebsiella pneumoniae NOT DETECTED NOT DETECTED Final   Proteus species NOT DETECTED NOT DETECTED Final   Salmonella species NOT DETECTED NOT DETECTED Final   Serratia marcescens NOT DETECTED NOT DETECTED Final   Haemophilus influenzae NOT DETECTED NOT DETECTED Final   Neisseria meningitidis NOT DETECTED NOT DETECTED Final   Pseudomonas aeruginosa NOT DETECTED NOT DETECTED Final   Stenotrophomonas maltophilia NOT DETECTED NOT DETECTED Final   Candida albicans NOT DETECTED NOT DETECTED Final   Candida auris NOT DETECTED NOT DETECTED Final   Candida glabrata NOT DETECTED NOT DETECTED Final   Candida krusei NOT DETECTED NOT DETECTED Final   Candida parapsilosis NOT DETECTED NOT DETECTED Final   Candida tropicalis NOT DETECTED NOT DETECTED Final   Cryptococcus neoformans/gattii NOT DETECTED NOT DETECTED Final   Meth resistant mecA/C and MREJ DETECTED (A) NOT DETECTED Final    Comment: CRITICAL RESULT CALLED TO, READ BACK BY AND VERIFIED WITH: T RUDISILL,PHARMD'@0148'  09/18/22 [place] Performed at [facility], [address], [place], [place] [address]   Culture, blood (Routine X 2) w Reflex to ID Panel      Status: None   Collection Time: 09/17/22  8:03 AM   Specimen: BLOOD RIGHT HAND  Result Value Ref Range Status   Specimen Description BLOOD RIGHT HAND  Final   Special Requests   Final    BOTTLES DRAWN AEROBIC ONLY Blood Culture results may not be optimal due to an inadequate volume of blood received in culture bottles   Culture   Final    NO GROWTH 5 DAYS Performed at [facility], [place] [address], [place], [place] [address]    Report Status 09/22/2022 FINAL  Final  Culture, blood (Routine X 2) w Reflex to ID Panel     Status: None (Preliminary result)   Collection Time: 09/20/22  3:02 PM   Specimen: BLOOD RIGHT HAND  Result Value Ref Range Status   Specimen Description BLOOD RIGHT HAND  Final   Special Requests   Final    BOTTLES DRAWN AEROBIC AND ANAEROBIC Blood Culture results may not be optimal due to an inadequate volume of blood received in culture bottles   Culture   Final    NO GROWTH 4 DAYS Performed at [facility], [place] [address] [place], [place] [address]    Report Status PENDING  Incomplete  Culture, blood (Routine X 2) w Reflex to ID Panel     Status: None (Preliminary result)   Collection Time: 09/20/22  3:02 PM   Specimen: BLOOD RIGHT HAND  Result Value Ref Range Status   Specimen Description BLOOD RIGHT HAND  Final   Special Requests   Final    BOTTLES DRAWN AEROBIC AND ANAEROBIC Blood Culture adequate volume   Culture   Final    NO GROWTH 4 DAYS Performed at [facility], [place] [address] [place], [place] [address]    Report Status PENDING  Incomplete    Radiology Studies:  ECHOCARDIOGRAM COMPLETE  Result Date: 09/22/2022    ECHOCARDIOGRAM REPORT   Patient Name:   ALEXAH KIVETT Date of Exam: 09/22/2022 Medical Rec #:  [id number]      Height:       66.0 in Accession #:    [id number]     Weight:       115.5  lb Date of Birth:  1951-03-28     BSA:          1.584 m Patient Age:    50 years       BP:           120/81 mmHg Patient Gender: F               HR:           67 bpm. Exam Location:  Inpatient Procedure: 2D Echo, Color Doppler and Cardiac Doppler Indications:    Bacteremia  History:        Patient has prior history of Echocardiogram examinations, most                 recent 08/16/2022. Arrythmias:Atrial Flutter; Risk                 Factors:Hypertension.  Sonographer:    Ronny Flurry Referring Phys: [id number] [place]  1. Left ventricular ejection fraction, by estimation, is 60 to 65%. The left ventricle has normal function. The left ventricle has no regional wall motion abnormalities. There is mild concentric left ventricular hypertrophy. Left ventricular diastolic function could not be evaluated.  2. Right ventricular systolic function is normal. The right ventricular size is normal. There is normal pulmonary artery systolic pressure.  3. Left atrial size was moderately dilated.  4. Previously noted mobile mass in the RA not visible on this study. Right atrial size was mildly dilated.  5. The mitral valve is abnormal. Mild mitral valve regurgitation. No evidence of mitral stenosis.  6. Tricuspid valve regurgitation is mild to moderate.  7. The aortic valve is tricuspid. There is mild calcification of the aortic valve. Aortic valve regurgitation is trivial. Aortic valve sclerosis/calcification is present, without any evidence of aortic stenosis. Aortic valve area, by VTI measures 2.96 cm. Aortic valve mean gradient measures 3.0 mmHg. Aortic valve Vmax measures 1.24 m/s.  8. Aortic dilatation noted. There is borderline dilatation of the ascending aorta, measuring 38 mm.  9. The inferior vena cava is dilated in size with <50% respiratory variability, suggesting right atrial pressure of 15 mmHg. FINDINGS  Left Ventricle: Left ventricular ejection fraction, by estimation, is 60 to 65%. The left ventricle has normal function. The left ventricle has no regional wall motion abnormalities. The left ventricular internal cavity size  was normal in size. There is  mild concentric left ventricular hypertrophy. Left ventricular diastolic function could not be evaluated due to atrial fibrillation. Left ventricular diastolic function could not be evaluated. Right Ventricle: The right ventricular size is normal. No increase in right ventricular wall thickness. Right ventricular systolic function is normal. There is normal pulmonary artery systolic pressure. The tricuspid regurgitant velocity is 2.27 m/s, and  with an assumed right atrial pressure of 15 mmHg, the estimated right ventricular systolic pressure is 10.1 mmHg. Left Atrium: Left atrial size was moderately dilated. Right Atrium: Previously noted mobile mass in the RA not visible on this study. Right atrial size was mildly dilated. Pericardium: There is no evidence of pericardial effusion. Mitral Valve: The mitral valve is abnormal. There is mild thickening of the mitral valve leaflet(s). Mild mitral valve regurgitation. No evidence of mitral valve stenosis. Tricuspid Valve: The tricuspid valve is normal in structure. Tricuspid valve regurgitation is mild to moderate. No evidence of tricuspid stenosis. Aortic Valve: The aortic valve is tricuspid. There is mild calcification of the aortic valve. Aortic valve regurgitation is trivial. Aortic valve sclerosis/calcification is present, without  any evidence of aortic stenosis. Aortic valve mean gradient measures 3.0 mmHg. Aortic valve peak gradient measures 6.2 mmHg. Aortic valve area, by VTI measures 2.96 cm. Pulmonic Valve: The pulmonic valve was normal in structure. Pulmonic valve regurgitation is not visualized. No evidence of pulmonic stenosis. Aorta: Aortic dilatation noted. There is borderline dilatation of the ascending aorta, measuring 38 mm. Venous: The inferior vena cava is dilated in size with less than 50% respiratory variability, suggesting right atrial pressure of 15 mmHg. IAS/Shunts: No atrial level shunt detected by color flow  Doppler.  LEFT VENTRICLE PLAX 2D LVIDd:         4.10 cm   Diastology LVIDs:         2.60 cm   LV e' medial:    7.72 cm/s LV PW:         1.30 cm   LV E/e' medial:  17.1 LV IVS:        1.20 cm   LV e' lateral:   8.59 cm/s LVOT diam:     2.20 cm   LV E/e' lateral: 15.4 LV SV:         78 LV SV Index:   49 LVOT Area:     3.80 cm  RIGHT VENTRICLE             IVC RV S prime:     15.10 cm/s  IVC diam: 2.80 cm TAPSE (M-mode): 1.7 cm LEFT ATRIUM             Index        RIGHT ATRIUM           Index LA diam:        3.90 cm 2.46 cm/m   RA Area:     13.00 cm LA Vol (A2C):   71.8 ml 45.30 ml/m  RA Volume:   27.70 ml  17.49 ml/m LA Vol (A4C):   77.2 ml 48.75 ml/m LA Biplane Vol: 73.0 ml 46.09 ml/m  AORTIC VALVE AV Area (Vmax):    3.57 cm AV Area (Vmean):   3.50 cm AV Area (VTI):     2.96 cm AV Vmax:           124.00 cm/s AV Vmean:          81.800 cm/s AV VTI:            0.263 m AV Peak Grad:      6.2 mmHg AV Mean Grad:      3.0 mmHg LVOT Vmax:         116.33 cm/s LVOT Vmean:        75.400 cm/s LVOT VTI:          0.205 m LVOT/AV VTI ratio: 0.78  AORTA Ao Root diam: 3.40 cm Ao Asc diam:  3.75 cm MV E velocity: 132.00 cm/s  TRICUSPID VALVE                             TR Peak grad:   20.6 mmHg                             TR Vmax:        227.00 cm/s                              SHUNTS  Systemic VTI:  0.20 m                             Systemic Diam: 2.20 cm Glori Bickers MD Electronically signed by Glori Bickers MD Signature Date/Time: 09/22/2022/4:24:56 PM    Final     Scheduled Meds:    amiodarone  200 mg Oral BID   apixaban  2.5 mg Oral BID   Chlorhexidine Gluconate Cloth  6 each Topical Q0600   [START ON 09/28/2022] darbepoetin (ARANESP) injection - DIALYSIS  200 mcg Intravenous Q Tue-HD   hydrALAZINE  100 mg Oral 2 times per day on Tue Thu Sat   hydrALAZINE  100 mg Oral 3 times per day on Sun Mon Wed Fri   hydrocortisone cream   Topical TID   metoprolol tartrate  50 mg Oral BID    mometasone-formoterol  2 puff Inhalation BID   pantoprazole  40 mg Oral BID   sevelamer carbonate  1,600 mg Oral TID WC    Continuous Infusions:    amiodarone 30 mg/hr (09/24/22 0850)   vancomycin Stopped (09/21/22 2140)     LOS: 7 days     Vernell Leep, MD,  FACP, [facility], [facility], [facility], [facility]   [facility] [place]     To contact the attending provider between 7A-7P or the covering provider during after hours 7P-7A, please log into the web site [email or web address] and access using universal  password for that web site. If you do not have the password, please call the hospital operator.  09/24/2022, 1:37 PM

## 2022-09-24 NOTE — Plan of Care (Signed)

## 2022-09-24 NOTE — Progress Notes (Addendum)
Rounding Note    Patient Name: Diamond [name] Date of Encounter: 09/24/2022  [place] Cardiologist: Freada Bergeron, MD   Subjective   Patient alert and oriented in bed this morning. Reports brief episode of shortness of breath when she woke up this morning that has since resolved. The chest pain that began during dialysis yesterday has fully resolved and patient says that she feels much better overall today.  Inpatient Medications    Scheduled Meds:  apixaban  2.5 mg Oral BID   Chlorhexidine Gluconate Cloth  6 each Topical Q0600   Chlorhexidine Gluconate Cloth  6 each Topical Q0600   [START ON 09/28/2022] darbepoetin (ARANESP) injection - DIALYSIS  200 mcg Intravenous Q Tue-HD   hydrALAZINE  100 mg Oral 2 times per day on Tue Thu Sat   hydrALAZINE  100 mg Oral 3 times per day on Sun Mon Wed Fri   hydrocortisone cream   Topical TID   metoprolol tartrate  50 mg Oral BID   mometasone-formoterol  2 puff Inhalation BID   pantoprazole  40 mg Oral BID   sevelamer carbonate  1,600 mg Oral TID WC   Continuous Infusions:  amiodarone 30 mg/hr (09/24/22 0850)   vancomycin Stopped (09/21/22 2140)   PRN Meds: acetaminophen **OR** acetaminophen, albuterol, camphor-menthol, cloNIDine, labetalol, ondansetron **OR** ondansetron (ZOFRAN) IV, mouth rinse   Vital Signs    Vitals:   09/24/22 0400 09/24/22 0456 09/24/22 0801 09/24/22 0846  BP: (!) 150/106 (!) 165/108  (!) 143/103  Pulse: 98 100  94  Resp: 16 (!) 22    Temp: 97.9 F (36.6 C) 98.2 F (36.8 C)    TempSrc: Axillary Axillary    SpO2:  98% 100%   Weight:      Height:        Intake/Output Summary (Last 24 hours) at 09/24/2022 0947 Last data filed at 09/24/2022 0400 Gross per 24 hour  Intake 322.93 ml  Output 0.3 ml  Net 322.63 ml      09/23/2022    5:44 PM 09/23/2022    2:50 PM 09/18/2022    4:57 PM  Last 3 Weights  Weight (lbs) 115 lb 15.4 oz 117 lb 4.6 oz 115 lb 8.3 oz  Weight (kg) 52.6 kg  53.2 kg 52.4 kg      Telemetry    Atrial fibrillation, rates 80-100 - Personally Reviewed  ECG    No new tracing today  Physical Exam   GEN: No acute distress.   Neck: No JVD Cardiac: irregularly irregular, no murmurs, rubs, or gallops.  Respiratory: Clear to auscultation bilaterally. GI: Soft, nontender, non-distended  MS: No edema; No deformity. Neuro:  Nonfocal  Psych: Normal affect   Labs    High Sensitivity Troponin:   Recent Labs  Lab 09/23/22 1831 09/23/22 2050  TROPONINIHS 23* 21*     Chemistry Recent Labs  Lab 09/22/22 0349 09/23/22 0320 09/24/22 0344  NA 135 135 135  K 3.9 4.3 3.9  CL 95* 96* 96*  CO2 '27 25 27  '$ GLUCOSE 105* 103* 92  BUN 28* 36* 23  CREATININE 5.22* 6.42* 4.91*  CALCIUM 10.4* 10.3 9.7  MG 1.7  --   --   ALBUMIN 2.4* 2.5* 2.4*  GFRNONAA 8* 6* 9*  ANIONGAP '13 14 12    '$ Lipids No results for input(s): "CHOL", "TRIG", "HDL", "LABVLDL", "LDLCALC", "CHOLHDL" in the last 168 hours.  Hematology Recent Labs  Lab 09/22/22 0349 09/23/22 0320 09/24/22 0344  WBC  3.5* 3.9* 3.6*  RBC 2.62* 2.69* 2.75*  HGB 8.7* 9.1* 9.2*  HCT 26.2* 26.5* 27.0*  MCV 100.0 98.5 98.2  MCH 33.2 33.8 33.5  MCHC 33.2 34.3 34.1  RDW 17.5* 17.7* 17.3*  PLT 179 189 225   Thyroid  Recent Labs  Lab 09/23/22 0320  TSH 3.268    BNPNo results for input(s): "BNP", "PROBNP" in the last 168 hours.  DDimer No results for input(s): "DDIMER" in the last 168 hours.   Radiology    ECHOCARDIOGRAM COMPLETE  Result Date: 09/22/2022    ECHOCARDIOGRAM REPORT   Patient Name:   Diamond [name] Date of Exam: 09/22/2022 Medical Rec #:  [id number]      Height:       66.0 in Accession #:    [id number]     Weight:       115.5 lb Date of Birth:  20-May-1951     BSA:          1.584 m Patient Age:    71 years       BP:           120/81 mmHg Patient Gender: F              HR:           67 bpm. Exam Location:  Inpatient Procedure: 2D Echo, Color Doppler and Cardiac Doppler  Indications:    Bacteremia  History:        Patient has prior history of Echocardiogram examinations, most                 recent 08/16/2022. Arrythmias:Atrial Flutter; Risk                 Factors:Hypertension.  Sonographer:    Ronny Flurry Referring Phys: [id number] [place]  1. Left ventricular ejection fraction, by estimation, is 60 to 65%. The left ventricle has normal function. The left ventricle has no regional wall motion abnormalities. There is mild concentric left ventricular hypertrophy. Left ventricular diastolic function could not be evaluated.  2. Right ventricular systolic function is normal. The right ventricular size is normal. There is normal pulmonary artery systolic pressure.  3. Left atrial size was moderately dilated.  4. Previously noted mobile mass in the RA not visible on this study. Right atrial size was mildly dilated.  5. The mitral valve is abnormal. Mild mitral valve regurgitation. No evidence of mitral stenosis.  6. Tricuspid valve regurgitation is mild to moderate.  7. The aortic valve is tricuspid. There is mild calcification of the aortic valve. Aortic valve regurgitation is trivial. Aortic valve sclerosis/calcification is present, without any evidence of aortic stenosis. Aortic valve area, by VTI measures 2.96 cm. Aortic valve mean gradient measures 3.0 mmHg. Aortic valve Vmax measures 1.24 m/s.  8. Aortic dilatation noted. There is borderline dilatation of the ascending aorta, measuring 38 mm.  9. The inferior vena cava is dilated in size with <50% respiratory variability, suggesting right atrial pressure of 15 mmHg. FINDINGS  Left Ventricle: Left ventricular ejection fraction, by estimation, is 60 to 65%. The left ventricle has normal function. The left ventricle has no regional wall motion abnormalities. The left ventricular internal cavity size was normal in size. There is  mild concentric left ventricular hypertrophy. Left ventricular diastolic function  could not be evaluated due to atrial fibrillation. Left ventricular diastolic function could not be evaluated. Right Ventricle: The right ventricular size is normal. No increase in right ventricular wall thickness.  Right ventricular systolic function is normal. There is normal pulmonary artery systolic pressure. The tricuspid regurgitant velocity is 2.27 m/s, and  with an assumed right atrial pressure of 15 mmHg, the estimated right ventricular systolic pressure is 74.2 mmHg. Left Atrium: Left atrial size was moderately dilated. Right Atrium: Previously noted mobile mass in the RA not visible on this study. Right atrial size was mildly dilated. Pericardium: There is no evidence of pericardial effusion. Mitral Valve: The mitral valve is abnormal. There is mild thickening of the mitral valve leaflet(s). Mild mitral valve regurgitation. No evidence of mitral valve stenosis. Tricuspid Valve: The tricuspid valve is normal in structure. Tricuspid valve regurgitation is mild to moderate. No evidence of tricuspid stenosis. Aortic Valve: The aortic valve is tricuspid. There is mild calcification of the aortic valve. Aortic valve regurgitation is trivial. Aortic valve sclerosis/calcification is present, without any evidence of aortic stenosis. Aortic valve mean gradient measures 3.0 mmHg. Aortic valve peak gradient measures 6.2 mmHg. Aortic valve area, by VTI measures 2.96 cm. Pulmonic Valve: The pulmonic valve was normal in structure. Pulmonic valve regurgitation is not visualized. No evidence of pulmonic stenosis. Aorta: Aortic dilatation noted. There is borderline dilatation of the ascending aorta, measuring 38 mm. Venous: The inferior vena cava is dilated in size with less than 50% respiratory variability, suggesting right atrial pressure of 15 mmHg. IAS/Shunts: No atrial level shunt detected by color flow Doppler.  LEFT VENTRICLE PLAX 2D LVIDd:         4.10 cm   Diastology LVIDs:         2.60 cm   LV e' medial:    7.72  cm/s LV PW:         1.30 cm   LV E/e' medial:  17.1 LV IVS:        1.20 cm   LV e' lateral:   8.59 cm/s LVOT diam:     2.20 cm   LV E/e' lateral: 15.4 LV SV:         78 LV SV Index:   49 LVOT Area:     3.80 cm  RIGHT VENTRICLE             IVC RV S prime:     15.10 cm/s  IVC diam: 2.80 cm TAPSE (M-mode): 1.7 cm LEFT ATRIUM             Index        RIGHT ATRIUM           Index LA diam:        3.90 cm 2.46 cm/m   RA Area:     13.00 cm LA Vol (A2C):   71.8 ml 45.30 ml/m  RA Volume:   27.70 ml  17.49 ml/m LA Vol (A4C):   77.2 ml 48.75 ml/m LA Biplane Vol: 73.0 ml 46.09 ml/m  AORTIC VALVE AV Area (Vmax):    3.57 cm AV Area (Vmean):   3.50 cm AV Area (VTI):     2.96 cm AV Vmax:           124.00 cm/s AV Vmean:          81.800 cm/s AV VTI:            0.263 m AV Peak Grad:      6.2 mmHg AV Mean Grad:      3.0 mmHg LVOT Vmax:         116.33 cm/s LVOT Vmean:        75.400  cm/s LVOT VTI:          0.205 m LVOT/AV VTI ratio: 0.78  AORTA Ao Root diam: 3.40 cm Ao Asc diam:  3.75 cm MV E velocity: 132.00 cm/s  TRICUSPID VALVE                             TR Peak grad:   20.6 mmHg                             TR Vmax:        227.00 cm/s                              SHUNTS                             Systemic VTI:  0.20 m                             Systemic Diam: 2.20 cm Glori Bickers MD Electronically signed by Glori Bickers MD Signature Date/Time: 09/22/2022/4:24:56 PM    Final     Cardiac Studies     Echo from 09/22/22:    1. Left ventricular ejection fraction, by estimation, is 60 to 65%. The  left ventricle has normal function. The left ventricle has no regional  wall motion abnormalities. There is mild concentric left ventricular  hypertrophy. Left ventricular diastolic  function could not be evaluated.   2. Right ventricular systolic function is normal. The right ventricular  size is normal. There is normal pulmonary artery systolic pressure.   3. Left atrial size was moderately dilated.   4.  Previously noted mobile mass in the RA not visible on this study.  Right atrial size was mildly dilated.   5. The mitral valve is abnormal. Mild mitral valve regurgitation. No  evidence of mitral stenosis.   6. Tricuspid valve regurgitation is mild to moderate.   7. The aortic valve is tricuspid. There is mild calcification of the  aortic valve. Aortic valve regurgitation is trivial. Aortic valve  sclerosis/calcification is present, without any evidence of aortic  stenosis. Aortic valve area, by VTI measures 2.96  cm. Aortic valve mean gradient measures 3.0 mmHg. Aortic valve Vmax  measures 1.24 m/s.   8. Aortic dilatation noted. There is borderline dilatation of the  ascending aorta, measuring 38 mm.   9. The inferior vena cava is dilated in size with <50% respiratory  variability, suggesting right atrial pressure of 15 mmHg.     Patient Profile     71 y.o. female with PMH of paroxysmal A-fib/flutter, ESRD on HD, history of recurrent MRSA bacteremia and infective endocarditis and discitis/osteomyelitis, hypertension, asthma, anemia of chronic disease, pulmonary nodule, who is currently admitted since 09/17/2022 for acute metabolic encephalopathy, fall at home, acute on chronic back pain, found to have acute recurrent MRSA bacteremia with progression of vertebral discitis/osteomyelitis.  Cardiology is following for paroxysmal A-fib with RVR since 09/22/2022.  Assessment & Plan    Paroxysmal atrial fibrillation with RVR, secondary hypercoagulable state CHA2DS2-VASc score 3  Patient with onset of A-fib with RVR on 10/17 in the setting of sepsis.  TTE obtained on 10/18 shows overall preserved ejection fraction, no significant biventricular dysfunction or valvular disease.  Patient  rates initially managed with diltiazem infusion, subsequently with oral diltiazem 30 mg every 6 hours and metoprolol 50 mg twice daily.  On the evening of 10/19, Dr. Nechama Guard was called to hemodialysis because patient  was with A-fib and RVR along with chest pain.  Ventricular rates to the 130s.  Amiodarone infusion initiated to slow heart rate and possibly convert patient back to normal sinus rhythm, Diltiazem discontinued.  Troponins obtained yesterday after onset of chest pain/RVR are reassuringly flat: 23, 21. Continue amiodarone infusion, patient continues to be in A-fib with improved rates following infusion initiation. Transition to oral Amiodarone tonight, '200mg'$  BID x2 weeks followed by '200mg'$  daily.  Suspect that recurrent RVR is related to her infection.  Optimal rate/rhythm control may not be possible until infection is under better control. Continue anticoagulation with Eliquis (low dose with CKD)  Acute/recurrent MRSA bacteremia with lumbar discitis/osteomyelitis and ventral epidural abscess History of infective endocarditis with right atrial mass concerning for thrombus versus vegetation  Patient with blood cultures drawn on 10/13 positive for MRSA.  Antibiotics being managed by infectious disease and patient with no growth to date on 10/16 blood cultures.  Patient with a previous echo obtained 08/16/2022 showing a small cyst/mobile density 0.5 x 0.9 cm in the RVOT at the level of RCC of the aortic valve.  This mobile density also noted on 3/23 echo and 7/23 TEE.  However repeat TEE this admission does not show this mobile mass.  Hypertension  Patient on an alternating schedule of hydralazine 100 mg (2 times per day on Tuesday, Thursday, Saturday and 3 times per day on Sunday, Monday, Wednesday, Friday).  Also receiving metoprolol tartrate 50 mg twice daily.  If amiodarone provides improvement in rate control, could consider transition to Coreg for better blood pressure management versus metoprolol.  ESRD Anemia of chronic disease  Patient receiving inpatient dialysis per nephrology management      For questions or updates, please contact [place] Please consult [email or web address] for  contact info under        Signed, Lily Kocher, PA-C  09/24/2022, 9:47 AM    Patient seen and examined.  Agree with above documentation.  On exam, patient is alert and oriented, normal rate, irregular rhythm, no murmurs, lungs CTAB, no LE edema.  Reports chest pain has resolved.  Troponins flat.  Suspect symptoms were due to A-fib with RVR.  Started amiodarone drip, rates better controlled.  Plan to convert to p.o. amiodarone this evening.  Donato Heinz, MD

## 2022-09-25 DIAGNOSIS — I10 Essential (primary) hypertension: Secondary | ICD-10-CM | POA: Diagnosis not present

## 2022-09-25 DIAGNOSIS — R7881 Bacteremia: Secondary | ICD-10-CM | POA: Diagnosis not present

## 2022-09-25 DIAGNOSIS — G9341 Metabolic encephalopathy: Secondary | ICD-10-CM | POA: Diagnosis not present

## 2022-09-25 DIAGNOSIS — N186 End stage renal disease: Secondary | ICD-10-CM | POA: Diagnosis not present

## 2022-09-25 DIAGNOSIS — I480 Paroxysmal atrial fibrillation: Secondary | ICD-10-CM | POA: Diagnosis not present

## 2022-09-25 DIAGNOSIS — B9562 Methicillin resistant Staphylococcus aureus infection as the cause of diseases classified elsewhere: Secondary | ICD-10-CM | POA: Diagnosis not present

## 2022-09-25 LAB — RENAL FUNCTION PANEL
Albumin: 2.5 g/dL — ABNORMAL LOW (ref 3.5–5.0)
Anion gap: 12 (ref 5–15)
BUN: 30 mg/dL — ABNORMAL HIGH (ref 8–23)
CO2: 27 mmol/L (ref 22–32)
Calcium: 10.4 mg/dL — ABNORMAL HIGH (ref 8.9–10.3)
Chloride: 95 mmol/L — ABNORMAL LOW (ref 98–111)
Creatinine, Ser: 6.39 mg/dL — ABNORMAL HIGH (ref 0.44–1.00)
GFR, Estimated: 6 mL/min — ABNORMAL LOW (ref >60)
Glucose, Bld: 92 mg/dL (ref 70–99)
Phosphorus: 4.5 mg/dL (ref 2.5–4.6)
Potassium: 4 mmol/L (ref 3.5–5.1)
Sodium: 134 mmol/L — ABNORMAL LOW (ref 135–145)

## 2022-09-25 LAB — CBC WITH DIFFERENTIAL/PLATELET
Abs Immature Granulocytes: 0.01 10*3/uL (ref 0.00–0.07)
Basophils Absolute: 0 10*3/uL (ref 0.0–0.1)
Basophils Relative: 1 %
Eosinophils Absolute: 0.2 10*3/uL (ref 0.0–0.5)
Eosinophils Relative: 7 %
HCT: 24.5 % — ABNORMAL LOW (ref 36.0–46.0)
Hemoglobin: 8.5 g/dL — ABNORMAL LOW (ref 12.0–15.0)
Immature Granulocytes: 0 %
Lymphocytes Relative: 32 %
Lymphs Abs: 1.1 10*3/uL (ref 0.7–4.0)
MCH: 34 pg (ref 26.0–34.0)
MCHC: 34.7 g/dL (ref 30.0–36.0)
MCV: 98 fL (ref 80.0–100.0)
Monocytes Absolute: 0.4 10*3/uL (ref 0.1–1.0)
Monocytes Relative: 13 %
Neutro Abs: 1.6 10*3/uL — ABNORMAL LOW (ref 1.7–7.7)
Neutrophils Relative %: 47 %
Platelets: 218 10*3/uL (ref 150–400)
RBC: 2.5 MIL/uL — ABNORMAL LOW (ref 3.87–5.11)
RDW: 17.4 % — ABNORMAL HIGH (ref 11.5–15.5)
WBC: 3.4 10*3/uL — ABNORMAL LOW (ref 4.0–10.5)
nRBC: 0 % (ref 0.0–0.2)

## 2022-09-25 LAB — VITAMIN B12: Vitamin B-12: 442 pg/mL (ref 180–914)

## 2022-09-25 LAB — CULTURE, BLOOD (ROUTINE X 2)
Culture: NO GROWTH
Culture: NO GROWTH
Special Requests: ADEQUATE

## 2022-09-25 LAB — VANCOMYCIN, RANDOM: Vancomycin Rm: 13 ug/mL

## 2022-09-25 MED ORDER — CEFAZOLIN SODIUM-DEXTROSE 1-4 GM/50ML-% IV SOLN: 1.0000 g | INTRAVENOUS | Status: DC

## 2022-09-25 MED ORDER — CEFAZOLIN SODIUM-DEXTROSE 1-4 GM/50ML-% IV SOLN
1.0000 g | INTRAVENOUS | Status: DC
  Filled 2022-09-25: qty 50

## 2022-09-25 MED ORDER — DILTIAZEM HCL ER COATED BEADS 120 MG PO CP24
120.0000 mg | ORAL_CAPSULE | Freq: Every day | ORAL | Status: DC
  Administered 2022-09-26 – 2022-10-01 (×6): 120 mg via ORAL
  Filled 2022-09-25 (×8): qty 1

## 2022-09-25 MED ORDER — CEFAZOLIN SODIUM-DEXTROSE 1-4 GM/50ML-% IV SOLN
1.0000 g | Freq: Every day | INTRAVENOUS | Status: DC
  Administered 2022-09-25 – 2022-09-30 (×6): 1 g via INTRAVENOUS
  Filled 2022-09-25 (×7): qty 50

## 2022-09-25 MED ORDER — VANCOMYCIN HCL 500 MG/100ML IV SOLN
500.0000 mg | Freq: Once | INTRAVENOUS | Status: AC
  Administered 2022-09-25: 500 mg via INTRAVENOUS
  Filled 2022-09-25: qty 100

## 2022-09-25 MED ORDER — VANCOMYCIN VARIABLE DOSE PER UNSTABLE RENAL FUNCTION (PHARMACIST DOSING): Status: DC

## 2022-09-25 MED ORDER — LORAZEPAM 2 MG/ML IJ SOLN
1.0000 mg | Freq: Once | INTRAMUSCULAR | Status: AC | PRN
  Administered 2022-09-25: 1 mg via INTRAVENOUS
  Filled 2022-09-25: qty 1

## 2022-09-25 NOTE — Progress Notes (Addendum)
Rounding Note    Patient Name: Diamond [name] Date of Encounter: 09/25/2022  [place] Cardiologist: Freada Bergeron, MD   Subjective   Denies chest pain or dyspnea.  Oriented x2  Inpatient Medications    Scheduled Meds:  apixaban  2.5 mg Oral BID   Chlorhexidine Gluconate Cloth  6 each Topical Q0600   [START ON 09/28/2022] darbepoetin (ARANESP) injection - DIALYSIS  200 mcg Intravenous Q Tue-HD   hydrALAZINE  100 mg Oral 2 times per day on Tue Thu Sat   hydrALAZINE  100 mg Oral 3 times per day on Sun Mon Wed Fri   hydrocortisone cream   Topical TID   metoprolol tartrate  50 mg Oral BID   mometasone-formoterol  2 puff Inhalation BID   pantoprazole  40 mg Oral BID   sevelamer carbonate  1,600 mg Oral TID WC   Continuous Infusions:  vancomycin Stopped (09/21/22 2140)   PRN Meds: acetaminophen **OR** acetaminophen, albuterol, camphor-menthol, cloNIDine, labetalol, ondansetron **OR** ondansetron (ZOFRAN) IV, mouth rinse   Vital Signs    Vitals:   09/25/22 0000 09/25/22 0021 09/25/22 0400 09/25/22 0803  BP:  (!) 133/101 (!) 162/100 (!) 184/93  Pulse:  79 76 70  Resp: '16 18 16 15  '$ Temp:  98.1 F (36.7 C) 97.9 F (36.6 C) 98 F (36.7 C)  TempSrc:  Axillary Axillary Oral  SpO2:  98%  100%  Weight:      Height:        Intake/Output Summary (Last 24 hours) at 09/25/2022 1018 Last data filed at 09/24/2022 2200 Gross per 24 hour  Intake 180 ml  Output --  Net 180 ml       09/23/2022    5:44 PM 09/23/2022    2:50 PM 09/18/2022    4:57 PM  Last 3 Weights  Weight (lbs) 115 lb 15.4 oz 117 lb 4.6 oz 115 lb 8.3 oz  Weight (kg) 52.6 kg 53.2 kg 52.4 kg      Telemetry    Normal sinus rhythm with rate 70s- Personally Reviewed  ECG    No new tracing today  Physical Exam   GEN: No acute distress.   Neck: No JVD Cardiac:RRR, no murmurs, rubs, or gallops.  Respiratory: Clear to auscultation bilaterally. GI: Soft, nontender, non-distended   MS: No edema; No deformity. Neuro:  Nonfocal  Psych: Normal affect   Labs    High Sensitivity Troponin:   Recent Labs  Lab 09/23/22 1831 09/23/22 2050  TROPONINIHS 23* 21*      Chemistry Recent Labs  Lab 09/22/22 0349 09/23/22 0320 09/24/22 0344 09/25/22 0559  NA 135 135 135 134*  K 3.9 4.3 3.9 4.0  CL 95* 96* 96* 95*  CO2 '27 25 27 27  '$ GLUCOSE 105* 103* 92 92  BUN 28* 36* 23 30*  CREATININE 5.22* 6.42* 4.91* 6.39*  CALCIUM 10.4* 10.3 9.7 10.4*  MG 1.7  --   --   --   ALBUMIN 2.4* 2.5* 2.4* 2.5*  GFRNONAA 8* 6* 9* 6*  ANIONGAP '13 14 12 12     '$ Lipids No results for input(s): "CHOL", "TRIG", "HDL", "LABVLDL", "LDLCALC", "CHOLHDL" in the last 168 hours.  Hematology Recent Labs  Lab 09/23/22 0320 09/24/22 0344 09/25/22 0559  WBC 3.9* 3.6* 3.4*  RBC 2.69* 2.75* 2.50*  HGB 9.1* 9.2* 8.5*  HCT 26.5* 27.0* 24.5*  MCV 98.5 98.2 98.0  MCH 33.8 33.5 34.0  MCHC 34.3 34.1 34.7  RDW 17.7*  17.3* 17.4*  PLT 189 225 218    Thyroid  Recent Labs  Lab 09/23/22 0320  TSH 3.268     BNPNo results for input(s): "BNP", "PROBNP" in the last 168 hours.  DDimer No results for input(s): "DDIMER" in the last 168 hours.   Radiology    No results found.  Cardiac Studies     Echo from 09/22/22:    1. Left ventricular ejection fraction, by estimation, is 60 to 65%. The  left ventricle has normal function. The left ventricle has no regional  wall motion abnormalities. There is mild concentric left ventricular  hypertrophy. Left ventricular diastolic  function could not be evaluated.   2. Right ventricular systolic function is normal. The right ventricular  size is normal. There is normal pulmonary artery systolic pressure.   3. Left atrial size was moderately dilated.   4. Previously noted mobile mass in the RA not visible on this study.  Right atrial size was mildly dilated.   5. The mitral valve is abnormal. Mild mitral valve regurgitation. No  evidence of mitral  stenosis.   6. Tricuspid valve regurgitation is mild to moderate.   7. The aortic valve is tricuspid. There is mild calcification of the  aortic valve. Aortic valve regurgitation is trivial. Aortic valve  sclerosis/calcification is present, without any evidence of aortic  stenosis. Aortic valve area, by VTI measures 2.96  cm. Aortic valve mean gradient measures 3.0 mmHg. Aortic valve Vmax  measures 1.24 m/s.   8. Aortic dilatation noted. There is borderline dilatation of the  ascending aorta, measuring 38 mm.   9. The inferior vena cava is dilated in size with <50% respiratory  variability, suggesting right atrial pressure of 15 mmHg.     Patient Profile     71 y.o. female with PMH of paroxysmal A-fib/flutter, ESRD on HD, history of recurrent MRSA bacteremia and infective endocarditis and discitis/osteomyelitis, hypertension, asthma, anemia of chronic disease, pulmonary nodule, who is currently admitted since 09/17/2022 for acute metabolic encephalopathy, fall at home, acute on chronic back pain, found to have acute recurrent MRSA bacteremia with progression of vertebral discitis/osteomyelitis.  Cardiology is following for paroxysmal A-fib with RVR since 09/22/2022.  Assessment & Plan    Paroxysmal atrial fibrillation: CHA2DS2-VASc 3 (hypertension, age, female).  Echocardiogram shows normal biventricular function, no significant valvular disease -Continue Eliquis 2.5 mg twice daily -She was started on IV amiodarone and has converted to sinus rhythm.  EKG in sinus rhythm shows QTc 520.  Would recommend stopping amiodarone -Continue metoprolol 50 mg twice daily and diltiazem 120 mg daily -Likely driven by acute illness.  Hopefully will maintain sinus rhythm as she recovers   Right atrial mass: Suspicious for thrombus at prior HD line site versus endocarditis given her recurrent MRSA bacteremia.  Not seen on echo 10/18   Recurrent MRSA bacteremia: Suspected source lumbar  discitis/osteomyelitis.  ID planning 8 weeks of IV vancomycin   Hypertension: On hydralazine, diltiazem, Lopressor   ESRD: On HD  For questions or updates, please contact [place] Please consult [email or web address] for contact info under        Signed, Donato Heinz, MD  09/25/2022, 10:18 AM

## 2022-09-25 NOTE — Progress Notes (Signed)
Removed dressing from [place] to (L) upper arm. The site is discolored and has drainage noted. Area cleaned with saline and provider in to look at access.

## 2022-09-25 NOTE — Progress Notes (Signed)
PROGRESS NOTE   Diamond [name]  ESP:[id number]    DOB: 13-Oct-1951    DOA: 09/16/2022  PCP: [name], MD   I have briefly reviewed patients previous medical records in [facility].  Chief Complaint  Patient presents with   Fall   Back Pain    Brief Narrative:  71 y.o. female with medical history significant of ESRD on HD, PAF on eliquis, HTN, h/o recurrent MRSA bacteremia and vertebral discitis, presents the ED following a fall at home and some acute on chronic back pain.  Patient also noted to be intermittently confused, reported missing a dialysis session due to back pain as per patient.  In the ED, work-up for any traumatic injury was negative including CT head/spine.  While in the ED, patient noted to spike a temp, blood cultures were taken and patient was started on IV vancomycin and cefepime.  Of note, patient recently finished 4 weeks of IV ABx (looks like vanc) 8/8.  Readmitted 8/22 for acute on chronic back pain with MRI concerning for l2-3 paravertebral edema and still elevated crp.  Treated with another 4 weeks of Bactrim PO.  Due to history, ID was consulted, as well as nephrology for HD.  Patient admitted for further management.   Assessment & Plan:  Principal Problem:   Acute metabolic encephalopathy Active Problems:   Hypertensive urgency   ESRD on hemodialysis (HCC)   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    Paroxysmal atrial flutter (HCC)   Thyroid nodule   Empty sella (HCC)   PAF (paroxysmal atrial fibrillation) (HCC)   MRSA bacteremia with lumbar discitis/osteomyelitis and ventral epidural abscess and acute on chronic back pain Had a temp spike, with no leukocytosis BC x2 (10/13) growing MRSA.  Surveillance blood cultures x2 from 10/16: Negative. Procalcitonin 0.62, CRP elevated but decreasing, 27.4 > 19.3 > 22.3 > 11.8.  ESR: 98. UA negative Chest x-ray unremarkable Repeat Echo not done as patient refused and moreover  echo 1 month ago with preserved function and no vegetations and ID suspects source is still lumbar discitis/osteomyelitis. CT lumbar spine without contrast unremarkable for any fracture or collection MRI lumbar spine showed progression of discitis with increased soft tissue extent of infection in the paraspinal and epidural spaces ID consulted, plan for 8 weeks of IV Vancomycin post HD. Neurosurgery/IR consulted no further work-up recommended Pain management and appears to be controlled. PT/OT- rec SNF, fall precautions   Hypertensive urgency Cont home PO hydralazine(alternating doses on HD and non HD days), lopressor, clonidine as needed.  May need to further titrate or adjust.  Mildly uncontrolled and fluctuating.   Acute metabolic encephalopathy ??  Dementia, noted to be sundowning towards the end of the day Family reports some sundowning as well at home towards evening May be due to HTN encephalopathy vs infection Management as above.  Delirium precautions.  Ongoing confusion.  Discontinued Percocet and use nonopioids for pain management.  Mental status continues to fluctuate.  Yesterday she was quite alert, much oriented, pleasantly confused but cooperative and not agitated.  This afternoon again with HD, agitated and not allowing them to get her on HD.  Trial of Ativan as needed for agitation.  UA 10/13, not indicative of UTI.  Avoiding Haldol due to prolonged QTc of 520,?  Related to amiodarone which is now discontinued.  Will check B12, RPR and thiamine levels for completion.  If her QTc normalizes and she continues to have AMS, could consider low-dose Seroquel  in the evenings.   ESRD on hemodialysis ([place]) 1 missed dialysis session PTA Nephrology on board for HD needs.  Had HD 10/17 afternoon, not much fluid taken off but went into rapid A-fib requiring Cardizem.  Again developed A-fib with mild RVR at HD on 10/19, cardiology evaluated, HD cut short, started amiodarone drip.  Unable to  start HD 10/21 due to agitation.  Anemia of ESRD/chronic disease Stable.  Leukopenia and thrombocytopenia: Thrombocytopenia resolved.  Leukopenia stable.?  Related to infection or antimicrobials.   Paroxysmal atrial flutter (HCC)/A-fib with RVR Noted to be in A-fib with RVR on 09/17/2022, s/p Cardizem drip Cont home metoprolol Cont home eliquis Post HD on 10/17, went into A-fib with RVR up to 160s, started on Cardizem drip > changed and consolidated to Cardizem CD. Recurrent issue, developed again 10/19 afternoon at HD, developed A-fib with mild RVR, cardiology evaluated, started amiodarone drip with hope of converting to NSR. EKG w/o acute changes. HS Trop 23>21.  On 10/20 at around 4:15 PM, reverted to sinus rhythm.  However per cardiology follow-up, discontinuing amiodarone due to QTc 520.  Continuing metoprolol 50 Mg twice daily and diltiazem CD120 Mg daily with hopes of maintaining sinus rhythm. Follows with [name], [place].   History of MRSA bacteremia and discitis  Pt finished 4 weeks IV Abx, followed by 4 more weeks of bactrim for discitis Followed by ID as an outpatient   Empty sella ([place]) Seen on CT Pt denies headache, recent visual changes Monitor   Thyroid nodule Thyroid ultrasound showed normal-sized thyroid with bilateral nodules, does not meet any criteria for biopsy    Body mass index is 18.72 kg/m.   DVT prophylaxis: apixaban (ELIQUIS) tablet 2.5 mg Start: 09/17/22 0315     Code Status: Full Code:  Family Communication: Daughter via phone 10/20 Disposition:  Status is: Inpatient Remains inpatient appropriate because: Bacteremia on IV antibiotics pending surveillance blood cultures to be negative, A-fib with RVR on Cardizem drip   As per discussion with daughter, at baseline patient has mild confusion and cognitive impairment.  She plans to come into the hospital maybe tomorrow and will advise [place] as to how her mother is doing as far as her mental  status.  Consultants:   Nephrology Infectious disease Neurosurgery Cardiology  Procedures:     Antimicrobials:   As above   Subjective:  When seen this morning, sleepy but arousable, oriented to self but was not saying much.  Indicated that she was okay.  Afternoon events as noted above-agitation in HD.  Objective:   Vitals:   09/25/22 0021 09/25/22 0400 09/25/22 0803 09/25/22 1123  BP: (!) 133/101 (!) 162/100 (!) 184/93   Pulse: 79 76 70 70  Resp: '18 16 15   ' Temp: 98.1 F (36.7 C) 97.9 F (36.6 C) 98 F (36.7 C) 97.6 F (36.4 C)  TempSrc: Axillary Axillary Oral Oral  SpO2: 98%  100% 99%  Weight:      Height:        General exam: Elderly female, moderately built, frail and chronically ill , lying comfortably supine in bed. Respiratory system: Clear to auscultation.  No increased work of breathing. Cardiovascular system: S1 & S2 heard, irregular irregular. No JVD, murmurs, rubs, gallops or clicks. No pedal edema.  Telemetry personally reviewed: Reverted to sinus rhythm on 10/20 around 4:15 PM. Gastrointestinal system: Abdomen is nondistended, soft and nontender. No organomegaly or masses felt. Normal bowel sounds heard. Central nervous system: Mental status as noted  above. No focal neurological deficits. Extremities: Symmetric 5 x 5 power.  Left upper arm AV fistula. Skin: No rashes, lesions or ulcers Psychiatry: Judgement and insight appear impaired.  Mood & affect appropriate.     Data Reviewed:   I have personally reviewed following labs and imaging studies   CBC: Recent Labs  Lab 09/23/22 0320 09/24/22 0344 09/25/22 0559  WBC 3.9* 3.6* 3.4*  NEUTROABS 2.1 1.9 1.6*  HGB 9.1* 9.2* 8.5*  HCT 26.5* 27.0* 24.5*  MCV 98.5 98.2 98.0  PLT 189 225 638    Basic Metabolic Panel: Recent Labs  Lab 09/20/22 0435 09/22/22 0349 09/23/22 0320 09/24/22 0344 09/25/22 0559  NA 135 135 135 135 134*  K 3.8 3.9 4.3 3.9 4.0  CL 97* 95* 96* 96* 95*  CO2 '27 27 25  27 27  ' GLUCOSE 95 105* 103* 92 92  BUN 27* 28* 36* 23 30*  CREATININE 5.15* 5.22* 6.42* 4.91* 6.39*  CALCIUM 9.8 10.4* 10.3 9.7 10.4*  MG  --  1.7  --   --   --   PHOS 5.0* 3.9 5.2* 3.7 4.5    Liver Function Tests: Recent Labs  Lab 09/20/22 0435 09/22/22 0349 09/23/22 0320 09/24/22 0344 09/25/22 0559  ALBUMIN 2.3* 2.4* 2.5* 2.4* 2.5*    CBG: No results for input(s): "GLUCAP" in the last 168 hours.  Microbiology Studies:   Recent Results (from the past 240 hour(s))  Culture, blood (Routine X 2) w Reflex to ID Panel     Status: Abnormal   Collection Time: 09/17/22  7:58 AM   Specimen: BLOOD RIGHT ARM  Result Value Ref Range Status   Specimen Description BLOOD RIGHT ARM  Final   Special Requests   Final    BOTTLES DRAWN AEROBIC AND ANAEROBIC Blood Culture adequate volume   Culture  Setup Time   Final    GRAM POSITIVE COCCI IN CLUSTERS ANAEROBIC BOTTLE ONLY CRITICAL RESULT CALLED TO, READ BACK BY AND VERIFIED WITH: T RUDISILL,PHARMD'@0148'  09/18/22 [place] Performed at [facility], [address] [place], [place] [address]    Culture METHICILLIN RESISTANT STAPHYLOCOCCUS AUREUS (A)  Final   Report Status 09/20/2022 FINAL  Final   Organism ID, Bacteria METHICILLIN RESISTANT STAPHYLOCOCCUS AUREUS  Final      Susceptibility   Methicillin resistant staphylococcus aureus - MIC*    CIPROFLOXACIN <=0.5 SENSITIVE Sensitive     ERYTHROMYCIN <=0.25 SENSITIVE Sensitive     GENTAMICIN <=0.5 SENSITIVE Sensitive     OXACILLIN RESISTANT Resistant     TETRACYCLINE >=16 RESISTANT Resistant     VANCOMYCIN 1 SENSITIVE Sensitive     TRIMETH/SULFA <=10 SENSITIVE Sensitive     CLINDAMYCIN <=0.25 SENSITIVE Sensitive     RIFAMPIN <=0.5 SENSITIVE Sensitive     Inducible Clindamycin NEGATIVE Sensitive     * METHICILLIN RESISTANT STAPHYLOCOCCUS AUREUS  Blood Culture ID Panel (Reflexed)     Status: Abnormal   Collection Time: 09/17/22  7:58 AM  Result Value Ref Range Status   Enterococcus  faecalis NOT DETECTED NOT DETECTED Final   Enterococcus Faecium NOT DETECTED NOT DETECTED Final   Listeria monocytogenes NOT DETECTED NOT DETECTED Final   Staphylococcus species DETECTED (A) NOT DETECTED Final    Comment: CRITICAL RESULT CALLED TO, READ BACK BY AND VERIFIED WITH: T RUDISILL,PHARMD'@0148'  09/18/22 [place]    Staphylococcus aureus (BCID) DETECTED (A) NOT DETECTED Final    Comment: Methicillin (oxacillin)-resistant Staphylococcus aureus (MRSA). MRSA is predictably resistant to beta-lactam antibiotics (except ceftaroline). Preferred  therapy is vancomycin unless clinically contraindicated. Patient requires contact precautions if  hospitalized. CRITICAL RESULT CALLED TO, READ BACK BY AND VERIFIED WITH: T RUDISILL,PHARMD'@0148'  09/18/22 [place]    Staphylococcus epidermidis NOT DETECTED NOT DETECTED Final   Staphylococcus lugdunensis NOT DETECTED NOT DETECTED Final   Streptococcus species NOT DETECTED NOT DETECTED Final   Streptococcus agalactiae NOT DETECTED NOT DETECTED Final   Streptococcus pneumoniae NOT DETECTED NOT DETECTED Final   Streptococcus pyogenes NOT DETECTED NOT DETECTED Final   A.calcoaceticus-baumannii NOT DETECTED NOT DETECTED Final   Bacteroides fragilis NOT DETECTED NOT DETECTED Final   Enterobacterales NOT DETECTED NOT DETECTED Final   Enterobacter cloacae complex NOT DETECTED NOT DETECTED Final   Escherichia coli NOT DETECTED NOT DETECTED Final   Klebsiella aerogenes NOT DETECTED NOT DETECTED Final   Klebsiella oxytoca NOT DETECTED NOT DETECTED Final   Klebsiella pneumoniae NOT DETECTED NOT DETECTED Final   Proteus species NOT DETECTED NOT DETECTED Final   Salmonella species NOT DETECTED NOT DETECTED Final   Serratia marcescens NOT DETECTED NOT DETECTED Final   Haemophilus influenzae NOT DETECTED NOT DETECTED Final   Neisseria meningitidis NOT DETECTED NOT DETECTED Final   Pseudomonas aeruginosa NOT DETECTED NOT DETECTED Final   Stenotrophomonas maltophilia NOT  DETECTED NOT DETECTED Final   Candida albicans NOT DETECTED NOT DETECTED Final   Candida auris NOT DETECTED NOT DETECTED Final   Candida glabrata NOT DETECTED NOT DETECTED Final   Candida krusei NOT DETECTED NOT DETECTED Final   Candida parapsilosis NOT DETECTED NOT DETECTED Final   Candida tropicalis NOT DETECTED NOT DETECTED Final   Cryptococcus neoformans/gattii NOT DETECTED NOT DETECTED Final   Meth resistant mecA/C and MREJ DETECTED (A) NOT DETECTED Final    Comment: CRITICAL RESULT CALLED TO, READ BACK BY AND VERIFIED WITH: T RUDISILL,PHARMD'@0148'  09/18/22 [place] Performed at [facility], [address] [place], [place] [address]   Culture, blood (Routine X 2) w Reflex to ID Panel     Status: None   Collection Time: 09/17/22  8:03 AM   Specimen: BLOOD RIGHT HAND  Result Value Ref Range Status   Specimen Description BLOOD RIGHT HAND  Final   Special Requests   Final    BOTTLES DRAWN AEROBIC ONLY Blood Culture results may not be optimal due to an inadequate volume of blood received in culture bottles   Culture   Final    NO GROWTH 5 DAYS Performed at [facility], [place] [address], [place], [place] [address]    Report Status 09/22/2022 FINAL  Final  Culture, blood (Routine X 2) w Reflex to ID Panel     Status: None   Collection Time: 09/20/22  3:02 PM   Specimen: BLOOD RIGHT HAND  Result Value Ref Range Status   Specimen Description BLOOD RIGHT HAND  Final   Special Requests   Final    BOTTLES DRAWN AEROBIC AND ANAEROBIC Blood Culture results may not be optimal due to an inadequate volume of blood received in culture bottles   Culture   Final    NO GROWTH 5 DAYS Performed at [facility], [place] [address], [place], [place] [address]    Report Status 09/25/2022 FINAL  Final  Culture, blood (Routine X 2) w Reflex to ID Panel     Status: None   Collection Time: 09/20/22  3:02 PM   Specimen: BLOOD RIGHT HAND  Result Value Ref Range Status   Specimen Description  BLOOD RIGHT HAND  Final  Special Requests   Final    BOTTLES DRAWN AEROBIC AND ANAEROBIC Blood Culture adequate volume   Culture   Final    NO GROWTH 5 DAYS Performed at [facility], [place] [address], [place], [place] [address]    Report Status 09/25/2022 FINAL  Final    Radiology Studies:  No results found.  Scheduled Meds:    apixaban  2.5 mg Oral BID   Chlorhexidine Gluconate Cloth  6 each Topical Q0600   [START ON 09/28/2022] darbepoetin (ARANESP) injection - DIALYSIS  200 mcg Intravenous Q Tue-HD   diltiazem  120 mg Oral Daily   hydrALAZINE  100 mg Oral 2 times per day on Tue Thu Sat   hydrALAZINE  100 mg Oral 3 times per day on Sun Mon Wed Fri   hydrocortisone cream   Topical TID   metoprolol tartrate  50 mg Oral BID   mometasone-formoterol  2 puff Inhalation BID   pantoprazole  40 mg Oral BID   sevelamer carbonate  1,600 mg Oral TID WC   vancomycin variable dose per unstable renal function (pharmacist dosing)   Does not apply See admin instructions    Continuous Infusions:     ceFAZolin (ANCEF) IV     vancomycin       LOS: 8 days     Vernell Leep, MD,  FACP, [place], [facility], [facility], [place]     To contact the attending provider between 7A-7P or the covering provider during after hours 7P-7A, please log into the web site [email or web address] and access using universal [place] password for that web site. If you do not have the password, please call the hospital operator.  09/25/2022, 3:07 PM

## 2022-09-25 NOTE — Progress Notes (Signed)
PHARMACY ANTIBIOTIC CONSULT NOTE   Diamond [name] a 71 y.o. female admitted with MRSA discitis/osteomyelitis.  Patient is ESRD on HD TTS and is supposed to receive vancomycin with HD through 11/15/22 per ID recommendations. Recently patient has been unable to tolerate full HD sessions due to hypotension, and has recently been refusing HD sessions.   Nephrology team would also like to add cefazolin for possible AVF site infection to add additional coverage to Vancomycin.   10/21 pre-HD vanc level: 13. Patient refused HD session.   Estimated Creatinine Clearance: 6.6 mL/min (A) (by C-G formula based on SCr of 6.39 mg/dL (H)).  Plan: Vancomycin 500 mg IV x1 (expected level after this dose ~24)  Cefazolin 1g IV Q24H x7d  Vancomycin per unstable renal function dosing due to refused HD sessions  F/U HD tolerability   Allergies:  Allergies  Allergen Reactions   Shellfish Allergy Other (See Comments)    Unknown reaction    Other Rash    Pt states anesthesia makes her brake out in rash    Filed Weights   09/18/22 1657 09/23/22 1450 09/23/22 1744  Weight: 52.4 kg (115 lb 8.3 oz) 53.2 kg (117 lb 4.6 oz) 52.6 kg (115 lb 15.4 oz)       Latest Ref Rng & Units 09/25/2022    5:59 AM 09/24/2022    3:44 AM 09/23/2022    3:20 AM  CBC  WBC 4.0 - 10.5 K/uL 3.4  3.6  3.9   Hemoglobin 12.0 - 15.0 g/dL 8.5  9.2  9.1   Hematocrit 36.0 - 46.0 % 24.5  27.0  26.5   Platelets 150 - 400 K/uL 218  225  189     Antibiotics Given (last 72 hours)     None       Thank you for allowing pharmacy to be a part of this patient's care.  Adria Dill, PharmD PGY-2 Infectious Diseases Resident  09/25/2022 3:04 PM

## 2022-09-25 NOTE — Progress Notes (Signed)
HD procedure explained to pt prior to attempting to start. Pt verbalized understanding and stated that she did not want tx today. Provider was called and did come to unit and spoke with pt. Pt stated that it was ok to start tx. When attempting to cannulating pt., pt did swing at staff and said don't do that. Provider was called and notified of pt behavior. Will send pt back to her room.

## 2022-09-25 NOTE — Plan of Care (Signed)

## 2022-09-26 DIAGNOSIS — N186 End stage renal disease: Secondary | ICD-10-CM | POA: Diagnosis not present

## 2022-09-26 DIAGNOSIS — B9562 Methicillin resistant Staphylococcus aureus infection as the cause of diseases classified elsewhere: Secondary | ICD-10-CM | POA: Diagnosis not present

## 2022-09-26 DIAGNOSIS — I480 Paroxysmal atrial fibrillation: Secondary | ICD-10-CM | POA: Diagnosis not present

## 2022-09-26 DIAGNOSIS — R7881 Bacteremia: Secondary | ICD-10-CM | POA: Diagnosis not present

## 2022-09-26 DIAGNOSIS — G9341 Metabolic encephalopathy: Secondary | ICD-10-CM | POA: Diagnosis not present

## 2022-09-26 DIAGNOSIS — I10 Essential (primary) hypertension: Secondary | ICD-10-CM | POA: Diagnosis not present

## 2022-09-26 LAB — CBC WITH DIFFERENTIAL/PLATELET
Abs Immature Granulocytes: 0.02 10*3/uL (ref 0.00–0.07)
Basophils Absolute: 0 10*3/uL (ref 0.0–0.1)
Basophils Relative: 0 %
Eosinophils Absolute: 0.3 10*3/uL (ref 0.0–0.5)
Eosinophils Relative: 6 %
HCT: 29.1 % — ABNORMAL LOW (ref 36.0–46.0)
Hemoglobin: 10.2 g/dL — ABNORMAL LOW (ref 12.0–15.0)
Immature Granulocytes: 0 %
Lymphocytes Relative: 23 %
Lymphs Abs: 1.2 10*3/uL (ref 0.7–4.0)
MCH: 34.2 pg — ABNORMAL HIGH (ref 26.0–34.0)
MCHC: 35.1 g/dL (ref 30.0–36.0)
MCV: 97.7 fL (ref 80.0–100.0)
Monocytes Absolute: 0.5 10*3/uL (ref 0.1–1.0)
Monocytes Relative: 9 %
Neutro Abs: 3.1 10*3/uL (ref 1.7–7.7)
Neutrophils Relative %: 62 %
Platelets: 268 10*3/uL (ref 150–400)
RBC: 2.98 MIL/uL — ABNORMAL LOW (ref 3.87–5.11)
RDW: 17.4 % — ABNORMAL HIGH (ref 11.5–15.5)
WBC: 5.2 10*3/uL (ref 4.0–10.5)
nRBC: 0 % (ref 0.0–0.2)

## 2022-09-26 LAB — RENAL FUNCTION PANEL
Albumin: 2.9 g/dL — ABNORMAL LOW (ref 3.5–5.0)
Anion gap: 14 (ref 5–15)
BUN: 34 mg/dL — ABNORMAL HIGH (ref 8–23)
CO2: 25 mmol/L (ref 22–32)
Calcium: 10.7 mg/dL — ABNORMAL HIGH (ref 8.9–10.3)
Chloride: 98 mmol/L (ref 98–111)
Creatinine, Ser: 7.54 mg/dL — ABNORMAL HIGH (ref 0.44–1.00)
GFR, Estimated: 5 mL/min — ABNORMAL LOW (ref >60)
Glucose, Bld: 93 mg/dL (ref 70–99)
Phosphorus: 4.8 mg/dL — ABNORMAL HIGH (ref 2.5–4.6)
Potassium: 4.2 mmol/L (ref 3.5–5.1)
Sodium: 137 mmol/L (ref 135–145)

## 2022-09-26 LAB — RPR: RPR Ser Ql: NONREACTIVE

## 2022-09-26 MED ORDER — LORAZEPAM 0.5 MG PO TABS: 0.5000 mg | ORAL_TABLET | Freq: Once | ORAL | Status: DC | PRN

## 2022-09-26 MED ORDER — LORAZEPAM 2 MG/ML IJ SOLN
0.5000 mg | Freq: Once | INTRAMUSCULAR | Status: AC | PRN
  Administered 2022-09-26: 0.5 mg via INTRAVENOUS
  Filled 2022-09-26: qty 1

## 2022-09-26 MED ORDER — LORAZEPAM 0.5 MG PO TABS
0.5000 mg | ORAL_TABLET | Freq: Every day | ORAL | Status: AC
  Administered 2022-09-26: 0.5 mg via ORAL
  Filled 2022-09-26: qty 1

## 2022-09-26 NOTE — Progress Notes (Signed)
Subjective:  Pt unable to get dialysis yesterday-  refused , then agreed, but then would not let nurses start treatment -  pt sleepy this AM unable to have a conversation about yesterday   Objective Vital signs in last 24 hours: Vitals:   09/25/22 2032 09/25/22 2358 09/26/22 0400 09/26/22 0732  BP:  (!) 184/85 (!) 173/96   Pulse:  85 84   Resp:   16   Temp:  98 F (36.7 C) 98.2 F (36.8 C)   TempSrc:  Oral Oral   SpO2: 97% 96% 94% 94%  Weight:      Height:       Weight change:   Physical Exam: General: Thin elderly female NAD this a.m.very  lethargic-  awakens only for seconds  Heart: RRR, no MRG Lungs: Clear anteriorly nonlabored breathing Abdomen: NABS soft, NTND no ascites Extremities: No pedal edema Dialysis Access: L UA  AVF + bruit    Home meds include - albuterol, apixaban, advair, hydralazine 100 2-3 x per day, metoprolol 50 bid, pantoprazole, sevelamer carb 2 ac tid, prns/ vits/ supps    OP HD: [place] TTS 4h   400/600  53.5kg  2/2 bath  Hep none LUA AVF - last HD 10/10 post wt 54.1kg - last Hb 10.1 - mircera 100 q2, new start  - iron sucrose 50 mg weekly - doxercalciferol 3 ug iv tts      109/100, HR 90,  RR 15  97%    K+ 5.4  BUN 42  cr 7.53   Problem/Plan Acute metabolic encephalopathy -multiple etiology MRSA bacteremia with vertebral discitis/osteomyelitis, dementia, gabapentin.   Had not really missed any HD sessions, No sig azotemia.  her baseline is with some cognitive deficits and sundowning -  is worse in this setting and does not seem to be getting better-  will be unable to dialyze in the OP setting in this condition 2.   ESRD - on HD TTS.  Usually good compliance.  Missed last Thursday, but had  Friday and Saturday to get back on schedule.  treatments T and Thurs complicated by Afib and hypotension -  intolerance of UF-  now seemingly more of behavioral issues. As above would not be able to do dialysis in the OP setting in this state.  Last HD was  basically Thursday.  Labs look ok -  no absolute need-  will try again on Monday to get down to the root of the issue-  had been making statements earler on in the hosp that she was "tired" of HD HTN /vol - not grossly volume overloaded-  BP for the most part high-  lopressor, hydralazine and PRN clonidine -  min to mod UF with HD-  now with the wrinkle of Afib BP is lower at times  Progression of discitis/osteomyelitis L-spine on MRI on 09/17/2022/= ID consulting noted [facility] 10/13 1 of 4 bottles GPC, MRSA per [facility] ID on vancomycin, TTE ordered by ID and neurosurgery to eval repeat blood cultures 10/15 ordered/NS consulted noted no surgery needed currently-  will need vand 500 mg per HD until 12/11 Rapid A-fib IV Cardizem drip started by admit team 10/13 evening.  Recurred again -  rate controlled this AM-  now on amiodarone -  in NSR Anemia esrd - On ESA weekly-  inc dose. Fe IV weekly held in setting of infection MBD ckd - CCa elevated, holding IV vdra. Cont renvela as binder.   Pt mentioned twice today is considering stopping  HD-  may be worth a palliative conversation ?    [facility]  09/26/2022,7:46 AM  LOS: 9 days   Labs: Basic Metabolic Panel: Recent Labs  Lab 09/24/22 0344 09/25/22 0559 09/26/22 0343  NA 135 134* 137  K 3.9 4.0 4.2  CL 96* 95* 98  CO2 '27 27 25  '$ GLUCOSE 92 92 93  BUN 23 30* 34*  CREATININE 4.91* 6.39* 7.54*  CALCIUM 9.7 10.4* 10.7*  PHOS 3.7 4.5 4.8*   Liver Function Tests: Recent Labs  Lab 09/24/22 0344 09/25/22 0559 09/26/22 0343  ALBUMIN 2.4* 2.5* 2.9*   No results for input(s): "LIPASE", "AMYLASE" in the last 168 hours. No results for input(s): "AMMONIA" in the last 168 hours.  CBC: Recent Labs  Lab 09/22/22 0349 09/23/22 0320 09/24/22 0344 09/25/22 0559 09/26/22 0343  WBC 3.5* 3.9* 3.6* 3.4* 5.2  NEUTROABS 1.9 2.1 1.9 1.6* 3.1  HGB 8.7* 9.1* 9.2* 8.5* 10.2*  HCT 26.2* 26.5* 27.0* 24.5* 29.1*  MCV  100.0 98.5 98.2 98.0 97.7  PLT 179 189 225 218 268   Cardiac Enzymes: No results for input(s): "CKTOTAL", "CKMB", "CKMBINDEX", "TROPONINI" in the last 168 hours. CBG: No results for input(s): "GLUCAP" in the last 168 hours.     ceFAZolin (ANCEF) IV 1 g (09/25/22 1838)    apixaban  2.5 mg Oral BID   Chlorhexidine Gluconate Cloth  6 each Topical Q0600   [START ON 09/28/2022] darbepoetin (ARANESP) injection - DIALYSIS  200 mcg Intravenous Q Tue-HD   diltiazem  120 mg Oral Daily   hydrALAZINE  100 mg Oral 2 times per day on Tue Thu Sat   hydrALAZINE  100 mg Oral 3 times per day on Sun Mon Wed Fri   hydrocortisone cream   Topical TID   metoprolol tartrate  50 mg Oral BID   mometasone-formoterol  2 puff Inhalation BID   pantoprazole  40 mg Oral BID   sevelamer carbonate  1,600 mg Oral TID WC   vancomycin variable dose per unstable renal function (pharmacist dosing)   Does not apply See admin instructions

## 2022-09-26 NOTE — Progress Notes (Signed)
PROGRESS NOTE   Diamond [name]  ZOX:[id number]    DOB: 25-Sep-1951    DOA: 09/16/2022  PCP: [name], MD   I have briefly reviewed patients previous medical records in [facility].  Chief Complaint  Patient presents with   Fall   Back Pain    Brief Narrative:  71 y.o. female with medical history significant of ESRD on HD, PAF on eliquis, HTN, h/o recurrent MRSA bacteremia and vertebral discitis, presents the ED following a fall at home and some acute on chronic back pain.  Patient also noted to be intermittently confused, reported missing a dialysis session due to back pain as per patient.  In the ED, work-up for any traumatic injury was negative including CT head/spine.  While in the ED, patient noted to spike a temp, blood cultures were taken and patient was started on IV vancomycin and cefepime.  Of note, patient recently finished 4 weeks of IV ABx (looks like vanc) 8/8.  Readmitted 8/22 for acute on chronic back pain with MRI concerning for l2-3 paravertebral edema and still elevated crp.  Treated with another 4 weeks of Bactrim PO.  Due to history, ID was consulted, as well as nephrology for HD.  Patient admitted for further management.   Assessment & Plan:  Principal Problem:   Acute metabolic encephalopathy Active Problems:   Hypertensive urgency   ESRD on hemodialysis (HCC)   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    Paroxysmal atrial flutter (HCC)   Thyroid nodule   Empty sella (HCC)   PAF (paroxysmal atrial fibrillation) (HCC)   MRSA bacteremia with lumbar discitis/osteomyelitis and ventral epidural abscess and acute on chronic back pain Had a temp spike, with no leukocytosis BC x2 (10/13) growing MRSA.  Surveillance blood cultures x2 from 10/16: Negative. Procalcitonin 0.62, CRP elevated but decreasing, 27.4 > 19.3 > 22.3 > 11.8.  ESR: 98. UA negative Chest x-ray unremarkable Repeat Echo not done as patient refused and moreover  echo 1 month ago with preserved function and no vegetations and ID suspects source is still lumbar discitis/osteomyelitis. CT lumbar spine without contrast unremarkable for any fracture or collection MRI lumbar spine showed progression of discitis with increased soft tissue extent of infection in the paraspinal and epidural spaces ID consulted, plan for 8 weeks of IV Vancomycin post HD. Neurosurgery/IR consulted no further work-up recommended Pain management and appears to be controlled. PT/OT- rec SNF, fall precautions   Hypertensive urgency Cont home PO hydralazine(alternating doses on HD and non HD days), lopressor, clonidine as needed.  May need to further titrate or adjust.  Mildly uncontrolled and fluctuating.   Acute metabolic encephalopathy ??  Dementia, noted to be sundowning towards the end of the day Family reports some sundowning as well at home towards evening May be due to HTN encephalopathy vs infection Management as above.  Delirium precautions.  Ongoing confusion.  Discontinued Percocet and use nonopioids for pain management.  Mental status continues to fluctuate.  Yesterday she was quite alert, much oriented, pleasantly confused but cooperative and not agitated.  This afternoon again with HD, agitated and not allowing them to get her on HD.  Trial of Ativan as needed for agitation.  UA 10/13, not indicative of UTI.  Avoiding Haldol due to prolonged QTc of 520,?  Related to amiodarone which is now discontinued.  Will check B12 (442), RPR (nonreactive) and thiamine (ordered but yet to be drawn) levels for completion.  QTc better but still prolonged, 508  ms by EKG per cardiology.  Avoid QT prolonging medications.  Did get a dose of Ativan 1 mg at 8:35 PM last night, somnolent this morning.  Trial of Ativan 0.5 Mg at 6 PM.   ESRD on hemodialysis ([place]) 1 missed dialysis session PTA Nephrology on board for HD needs.  Had HD 10/17 afternoon, not much fluid taken off but went into rapid  A-fib requiring Cardizem.  Again developed A-fib with mild RVR at HD on 10/19, cardiology evaluated, HD cut short, started amiodarone drip.  Unable to start HD 10/21 due to agitation.  As per nephrology, plan for HD on 10/23 if she allows and reportedly has been stating that she is tired of dialysis and told someone that she did not want to pursue dialysis any longer.  Anemia of ESRD/chronic disease Stable.  Leukopenia and thrombocytopenia: Thrombocytopenia resolved.  Leukopenia stable.?  Related to infection or antimicrobials.   Paroxysmal atrial flutter (HCC)/A-fib with RVR Noted to be in A-fib with RVR on 09/17/2022, s/p Cardizem drip Cont home metoprolol Cont home eliquis Post HD on 10/17, went into A-fib with RVR up to 160s, started on Cardizem drip > changed and consolidated to Cardizem CD. Recurrent issue, developed again 10/19 afternoon at HD, developed A-fib with mild RVR, cardiology evaluated, started amiodarone drip with hope of converting to NSR. EKG w/o acute changes. HS Trop 23>21.  On 10/20 at around 4:15 PM, reverted to sinus rhythm.  However per cardiology follow-up, discontinuing amiodarone due to QTc 520 >508.  Continuing metoprolol 50 Mg twice daily and diltiazem CD120 Mg daily with hopes of maintaining sinus rhythm.  Cardiology signed off 10/22 Follows with [name], [place].   History of MRSA bacteremia and discitis  Pt finished 4 weeks IV Abx, followed by 4 more weeks of bactrim for discitis Followed by ID as an outpatient   Empty sella ([place]) Seen on CT Pt denies headache, recent visual changes Monitor   Thyroid nodule Thyroid ultrasound showed normal-sized thyroid with bilateral nodules, does not meet any criteria for biopsy    Body mass index is 18.72 kg/m.   DVT prophylaxis: apixaban (ELIQUIS) tablet 2.5 mg Start: 09/17/22 0315     Code Status: Full Code:  Family Communication: Son via phone on 10/22. Disposition:  Status is:  Inpatient Remains inpatient appropriate because: Bacteremia on IV antibiotics pending surveillance blood cultures to be negative, A-fib with RVR on Cardizem drip   As per discussion with daughter, at baseline patient has mild confusion and cognitive impairment.  She plans to come into the hospital maybe tomorrow and will advise [place] as to how her mother is doing as far as her mental status.  Consultants:   Nephrology Infectious disease Neurosurgery Cardiology  Procedures:     Antimicrobials:   As above   Subjective:  Had breakfast tray in front of her but somnolent.  Arousable.  Unable to stay awake and drifting back to sleep.  Likely related to Ativan that she got last night.  Objective:   Vitals:   09/26/22 0400 09/26/22 0732 09/26/22 0854 09/26/22 1228  BP: (!) 173/96  (!) 177/116 (!) 170/108  Pulse: 84  76 79  Resp: '16  18 20  ' Temp: 98.2 F (36.8 C)  98.4 F (36.9 C) 98.5 F (36.9 C)  TempSrc: Oral  Oral Oral  SpO2: 94% 94% 100% 100%  Weight:      Height:        General exam: Elderly female, moderately built, frail  and chronically ill , lying comfortably supine in bed. Respiratory system: Clear to auscultation.  No increased work of breathing. Cardiovascular system: S1 & S2 heard, irregular irregular. No JVD, murmurs, rubs, gallops or clicks. No pedal edema.  Telemetry personally reviewed: Reverted to sinus rhythm on September 26, 2023 around 4:15 PM.  Remains in sinus rhythm Gastrointestinal system: Abdomen is nondistended, soft and nontender. No organomegaly or masses felt. Normal bowel sounds heard. Central nervous system: Mental status as noted above. No focal neurological deficits. Extremities: Symmetric 5 x 5 power.  Left upper arm AV fistula. Skin: No rashes, lesions or ulcers Psychiatry: Judgement and insight appear impaired.  Mood & affect appropriate.     Data Reviewed:   I have personally reviewed following labs and imaging studies   CBC: Recent Labs  Lab  2022-09-25 0344 09/25/22 0559 09/26/22 0343  WBC 3.6* 3.4* 5.2  NEUTROABS 1.9 1.6* 3.1  HGB 9.2* 8.5* 10.2*  HCT 27.0* 24.5* 29.1*  MCV 98.2 98.0 97.7  PLT 225 218 782    Basic Metabolic Panel: Recent Labs  Lab 09/22/22 0349 09/23/22 0320 09-25-22 0344 09/25/22 0559 09/26/22 0343  NA 135 135 135 134* 137  K 3.9 4.3 3.9 4.0 4.2  CL 95* 96* 96* 95* 98  CO2 '27 25 27 27 25  ' GLUCOSE 105* 103* 92 92 93  BUN 28* 36* 23 30* 34*  CREATININE 5.22* 6.42* 4.91* 6.39* 7.54*  CALCIUM 10.4* 10.3 9.7 10.4* 10.7*  MG 1.7  --   --   --   --   PHOS 3.9 5.2* 3.7 4.5 4.8*    Liver Function Tests: Recent Labs  Lab 09/22/22 0349 09/23/22 0320 2022/09/25 0344 09/25/22 0559 09/26/22 0343  ALBUMIN 2.4* 2.5* 2.4* 2.5* 2.9*    CBG: No results for input(s): "GLUCAP" in the last 168 hours.  Microbiology Studies:   Recent Results (from the past 240 hour(s))  Culture, blood (Routine X 2) w Reflex to ID Panel     Status: Abnormal   Collection Time: 09/17/22  7:58 AM   Specimen: BLOOD RIGHT ARM  Result Value Ref Range Status   Specimen Description BLOOD RIGHT ARM  Final   Special Requests   Final    BOTTLES DRAWN AEROBIC AND ANAEROBIC Blood Culture adequate volume   Culture  Setup Time   Final    GRAM POSITIVE COCCI IN CLUSTERS ANAEROBIC BOTTLE ONLY CRITICAL RESULT CALLED TO, READ BACK BY AND VERIFIED WITH: T RUDISILL,PHARMD'@0148'  09/18/22 [place] Performed at [facility], [address] [place], [place] [address]    Culture METHICILLIN RESISTANT STAPHYLOCOCCUS AUREUS (A)  Final   Report Status 09/20/2022 FINAL  Final   Organism ID, Bacteria METHICILLIN RESISTANT STAPHYLOCOCCUS AUREUS  Final      Susceptibility   Methicillin resistant staphylococcus aureus - MIC*    CIPROFLOXACIN <=0.5 SENSITIVE Sensitive     ERYTHROMYCIN <=0.25 SENSITIVE Sensitive     GENTAMICIN <=0.5 SENSITIVE Sensitive     OXACILLIN RESISTANT Resistant     TETRACYCLINE >=16 RESISTANT Resistant     VANCOMYCIN 1  SENSITIVE Sensitive     TRIMETH/SULFA <=10 SENSITIVE Sensitive     CLINDAMYCIN <=0.25 SENSITIVE Sensitive     RIFAMPIN <=0.5 SENSITIVE Sensitive     Inducible Clindamycin NEGATIVE Sensitive     * METHICILLIN RESISTANT STAPHYLOCOCCUS AUREUS  Blood Culture ID Panel (Reflexed)     Status: Abnormal   Collection Time: 09/17/22  7:58 AM  Result Value Ref Range Status   Enterococcus faecalis  NOT DETECTED NOT DETECTED Final   Enterococcus Faecium NOT DETECTED NOT DETECTED Final   Listeria monocytogenes NOT DETECTED NOT DETECTED Final   Staphylococcus species DETECTED (A) NOT DETECTED Final    Comment: CRITICAL RESULT CALLED TO, READ BACK BY AND VERIFIED WITH: T RUDISILL,PHARMD'@0148'  09/18/22 [place]    Staphylococcus aureus (BCID) DETECTED (A) NOT DETECTED Final    Comment: Methicillin (oxacillin)-resistant Staphylococcus aureus (MRSA). MRSA is predictably resistant to beta-lactam antibiotics (except ceftaroline). Preferred therapy is vancomycin unless clinically contraindicated. Patient requires contact precautions if  hospitalized. CRITICAL RESULT CALLED TO, READ BACK BY AND VERIFIED WITH: T RUDISILL,PHARMD'@0148'  09/18/22 [place]    Staphylococcus epidermidis NOT DETECTED NOT DETECTED Final   Staphylococcus lugdunensis NOT DETECTED NOT DETECTED Final   Streptococcus species NOT DETECTED NOT DETECTED Final   Streptococcus agalactiae NOT DETECTED NOT DETECTED Final   Streptococcus pneumoniae NOT DETECTED NOT DETECTED Final   Streptococcus pyogenes NOT DETECTED NOT DETECTED Final   A.calcoaceticus-baumannii NOT DETECTED NOT DETECTED Final   Bacteroides fragilis NOT DETECTED NOT DETECTED Final   Enterobacterales NOT DETECTED NOT DETECTED Final   Enterobacter cloacae complex NOT DETECTED NOT DETECTED Final   Escherichia coli NOT DETECTED NOT DETECTED Final   Klebsiella aerogenes NOT DETECTED NOT DETECTED Final   Klebsiella oxytoca NOT DETECTED NOT DETECTED Final   Klebsiella pneumoniae NOT DETECTED NOT  DETECTED Final   Proteus species NOT DETECTED NOT DETECTED Final   Salmonella species NOT DETECTED NOT DETECTED Final   Serratia marcescens NOT DETECTED NOT DETECTED Final   Haemophilus influenzae NOT DETECTED NOT DETECTED Final   Neisseria meningitidis NOT DETECTED NOT DETECTED Final   Pseudomonas aeruginosa NOT DETECTED NOT DETECTED Final   Stenotrophomonas maltophilia NOT DETECTED NOT DETECTED Final   Candida albicans NOT DETECTED NOT DETECTED Final   Candida auris NOT DETECTED NOT DETECTED Final   Candida glabrata NOT DETECTED NOT DETECTED Final   Candida krusei NOT DETECTED NOT DETECTED Final   Candida parapsilosis NOT DETECTED NOT DETECTED Final   Candida tropicalis NOT DETECTED NOT DETECTED Final   Cryptococcus neoformans/gattii NOT DETECTED NOT DETECTED Final   Meth resistant mecA/C and MREJ DETECTED (A) NOT DETECTED Final    Comment: CRITICAL RESULT CALLED TO, READ BACK BY AND VERIFIED WITH: T RUDISILL,PHARMD'@0148'  09/18/22 [place] Performed at [facility], [address] [place], [place] [address]   Culture, blood (Routine X 2) w Reflex to ID Panel     Status: None   Collection Time: 09/17/22  8:03 AM   Specimen: BLOOD RIGHT HAND  Result Value Ref Range Status   Specimen Description BLOOD RIGHT HAND  Final   Special Requests   Final    BOTTLES DRAWN AEROBIC ONLY Blood Culture results may not be optimal due to an inadequate volume of blood received in culture bottles   Culture   Final    NO GROWTH 5 DAYS Performed at [facility], [place] [address], [place], [place] [address]    Report Status 09/22/2022 FINAL  Final  Culture, blood (Routine X 2) w Reflex to ID Panel     Status: None   Collection Time: 09/20/22  3:02 PM   Specimen: BLOOD RIGHT HAND  Result Value Ref Range Status   Specimen Description BLOOD RIGHT HAND  Final   Special Requests   Final    BOTTLES DRAWN AEROBIC AND ANAEROBIC Blood Culture results may not be optimal due to an inadequate volume of blood  received in culture bottles   Culture  Final    NO GROWTH 5 DAYS Performed at [facility], [place] [address] [place], [place] [address]    Report Status 09/25/2022 FINAL  Final  Culture, blood (Routine X 2) w Reflex to ID Panel     Status: None   Collection Time: 09/20/22  3:02 PM   Specimen: BLOOD RIGHT HAND  Result Value Ref Range Status   Specimen Description BLOOD RIGHT HAND  Final   Special Requests   Final    BOTTLES DRAWN AEROBIC AND ANAEROBIC Blood Culture adequate volume   Culture   Final    NO GROWTH 5 DAYS Performed at [facility], [name] [address] [place], [place] [address]    Report Status 09/25/2022 FINAL  Final    Radiology Studies:  No results found.  Scheduled Meds:    apixaban  2.5 mg Oral BID   Chlorhexidine Gluconate Cloth  6 each Topical Q0600   [START ON 09/28/2022] darbepoetin (ARANESP) injection - DIALYSIS  200 mcg Intravenous Q Tue-HD   diltiazem  120 mg Oral Daily   hydrALAZINE  100 mg Oral 2 times per day on Tue Thu Sat   hydrALAZINE  100 mg Oral 3 times per day on Sun Mon Wed Fri   hydrocortisone cream   Topical TID   metoprolol tartrate  50 mg Oral BID   mometasone-formoterol  2 puff Inhalation BID   pantoprazole  40 mg Oral BID   sevelamer carbonate  1,600 mg Oral TID WC   vancomycin variable dose per unstable renal function (pharmacist dosing)   Does not apply See admin instructions    Continuous Infusions:     ceFAZolin (ANCEF) IV 1 g (09/25/22 1838)     LOS: 9 days     Vernell Leep, MD,  FACP, FHM, [facility], [facility], [place]     To contact the attending provider between 7A-7P or the covering provider during after hours 7P-7A, please log into the web site [email or web address] and access using universal [place] password for that web site. If you do not have the password, please call the hospital operator.  09/26/2022, 3:42 PM

## 2022-09-26 NOTE — Progress Notes (Signed)
Rounding Note    Patient Name: Diamond [name] Date of Encounter: 09/26/2022  [place] Cardiologist: Freada Bergeron, MD   Subjective   Denies chest pain or dyspnea.  Oriented x3  Inpatient Medications    Scheduled Meds:  apixaban  2.5 mg Oral BID   Chlorhexidine Gluconate Cloth  6 each Topical Q0600   [START ON 09/28/2022] darbepoetin (ARANESP) injection - DIALYSIS  200 mcg Intravenous Q Tue-HD   diltiazem  120 mg Oral Daily   hydrALAZINE  100 mg Oral 2 times per day on Tue Thu Sat   hydrALAZINE  100 mg Oral 3 times per day on Sun Mon Wed Fri   hydrocortisone cream   Topical TID   metoprolol tartrate  50 mg Oral BID   mometasone-formoterol  2 puff Inhalation BID   pantoprazole  40 mg Oral BID   sevelamer carbonate  1,600 mg Oral TID WC   vancomycin variable dose per unstable renal function (pharmacist dosing)   Does not apply See admin instructions   Continuous Infusions:   ceFAZolin (ANCEF) IV 1 g (09/25/22 1838)   PRN Meds: acetaminophen **OR** acetaminophen, albuterol, camphor-menthol, cloNIDine, labetalol, ondansetron **OR** ondansetron (ZOFRAN) IV, mouth rinse   Vital Signs    Vitals:   09/26/22 0400 09/26/22 0732 09/26/22 0854 09/26/22 1228  BP: (!) 173/96  (!) 177/116 (!) 170/108  Pulse: 84  76 79  Resp: '16  18 20  '$ Temp: 98.2 F (36.8 C)  98.4 F (36.9 C) 98.5 F (36.9 C)  TempSrc: Oral  Oral Oral  SpO2: 94% 94% 100% 100%  Weight:      Height:        Intake/Output Summary (Last 24 hours) at 09/26/2022 1321 Last data filed at 09/26/2022 0530 Gross per 24 hour  Intake --  Output 150 ml  Net -150 ml       09/23/2022    5:44 PM 09/23/2022    2:50 PM 09/18/2022    4:57 PM  Last 3 Weights  Weight (lbs) 115 lb 15.4 oz 117 lb 4.6 oz 115 lb 8.3 oz  Weight (kg) 52.6 kg 53.2 kg 52.4 kg      Telemetry    Normal sinus rhythm with rate 70s- Personally Reviewed  ECG    NSR rate 68, Qtc 508  Physical Exam   GEN: No acute  distress.   Neck: No JVD Cardiac:RRR, no murmurs, rubs, or gallops.  Respiratory: Clear to auscultation bilaterally. GI: Soft, nontender, non-distended  MS: No edema; No deformity. Neuro:  Nonfocal  Psych: Normal affect   Labs    High Sensitivity Troponin:   Recent Labs  Lab 09/23/22 1831 09/23/22 2050  TROPONINIHS 23* 21*      Chemistry Recent Labs  Lab 09/22/22 0349 09/23/22 0320 09/24/22 0344 09/25/22 0559 09/26/22 0343  NA 135   < > 135 134* 137  K 3.9   < > 3.9 4.0 4.2  CL 95*   < > 96* 95* 98  CO2 27   < > '27 27 25  '$ GLUCOSE 105*   < > 92 92 93  BUN 28*   < > 23 30* 34*  CREATININE 5.22*   < > 4.91* 6.39* 7.54*  CALCIUM 10.4*   < > 9.7 10.4* 10.7*  MG 1.7  --   --   --   --   ALBUMIN 2.4*   < > 2.4* 2.5* 2.9*  GFRNONAA 8*   < > 9* 6*  5*  ANIONGAP 13   < > '12 12 14   '$ < > = values in this interval not displayed.     Lipids No results for input(s): "CHOL", "TRIG", "HDL", "LABVLDL", "LDLCALC", "CHOLHDL" in the last 168 hours.  Hematology Recent Labs  Lab 09/24/22 0344 09/25/22 0559 09/26/22 0343  WBC 3.6* 3.4* 5.2  RBC 2.75* 2.50* 2.98*  HGB 9.2* 8.5* 10.2*  HCT 27.0* 24.5* 29.1*  MCV 98.2 98.0 97.7  MCH 33.5 34.0 34.2*  MCHC 34.1 34.7 35.1  RDW 17.3* 17.4* 17.4*  PLT 225 218 268    Thyroid  Recent Labs  Lab 09/23/22 0320  TSH 3.268     BNPNo results for input(s): "BNP", "PROBNP" in the last 168 hours.  DDimer No results for input(s): "DDIMER" in the last 168 hours.   Radiology    No results found.  Cardiac Studies     Echo from 09/22/22:    1. Left ventricular ejection fraction, by estimation, is 60 to 65%. The  left ventricle has normal function. The left ventricle has no regional  wall motion abnormalities. There is mild concentric left ventricular  hypertrophy. Left ventricular diastolic  function could not be evaluated.   2. Right ventricular systolic function is normal. The right ventricular  size is normal. There is normal  pulmonary artery systolic pressure.   3. Left atrial size was moderately dilated.   4. Previously noted mobile mass in the RA not visible on this study.  Right atrial size was mildly dilated.   5. The mitral valve is abnormal. Mild mitral valve regurgitation. No  evidence of mitral stenosis.   6. Tricuspid valve regurgitation is mild to moderate.   7. The aortic valve is tricuspid. There is mild calcification of the  aortic valve. Aortic valve regurgitation is trivial. Aortic valve  sclerosis/calcification is present, without any evidence of aortic  stenosis. Aortic valve area, by VTI measures 2.96  cm. Aortic valve mean gradient measures 3.0 mmHg. Aortic valve Vmax  measures 1.24 m/s.   8. Aortic dilatation noted. There is borderline dilatation of the  ascending aorta, measuring 38 mm.   9. The inferior vena cava is dilated in size with <50% respiratory  variability, suggesting right atrial pressure of 15 mmHg.     Patient Profile     71 y.o. female with PMH of paroxysmal A-fib/flutter, ESRD on HD, history of recurrent MRSA bacteremia and infective endocarditis and discitis/osteomyelitis, hypertension, asthma, anemia of chronic disease, pulmonary nodule, who is currently admitted since 09/17/2022 for acute metabolic encephalopathy, fall at home, acute on chronic back pain, found to have acute recurrent MRSA bacteremia with progression of vertebral discitis/osteomyelitis.  Cardiology is following for paroxysmal A-fib with RVR since 09/22/2022.  Assessment & Plan    Paroxysmal atrial fibrillation: CHA2DS2-VASc 3 (hypertension, age, female).  Echocardiogram shows normal biventricular function, no significant valvular disease -Continue Eliquis 2.5 mg twice daily -She was started on IV amiodarone and has converted to sinus rhythm.  EKG in sinus rhythm showed QTc 520.  Discontinued amiodarone -Continue metoprolol 50 mg twice daily and diltiazem 120 mg daily -Likely driven by acute illness.   Hopefully will maintain sinus rhythm as she recovers  Prolonged QT: EKG in sinus rhythm showed QTc 520.  Amiodarone discontinued. Improved to 508 on EKG today.  Avoid QT prolonging medications   Right atrial mass: Suspicious for thrombus at prior HD line site versus endocarditis given her recurrent MRSA bacteremia.  Not seen on echo 10/18  Recurrent MRSA bacteremia: Suspected source lumbar discitis/osteomyelitis.  ID planning 8 weeks of IV vancomycin   Hypertension: On hydralazine, diltiazem, Lopressor   ESRD: On HD  [place] will sign off.   Medication Recommendations:  metoprolol 50 mg BID, diltiazem 120 mg daily, Eliquis 2.5 mg BID Other recommendations (labs, testing, etc):  None Follow up as an outpatient:  Will schedule   For questions or updates, please contact [place] Please consult [email or web address] for contact info under        Signed, Donato Heinz, MD  09/26/2022, 1:21 PM

## 2022-09-27 DIAGNOSIS — N186 End stage renal disease: Secondary | ICD-10-CM | POA: Diagnosis not present

## 2022-09-27 DIAGNOSIS — Z992 Dependence on renal dialysis: Secondary | ICD-10-CM | POA: Diagnosis not present

## 2022-09-27 DIAGNOSIS — G9341 Metabolic encephalopathy: Secondary | ICD-10-CM | POA: Diagnosis not present

## 2022-09-27 DIAGNOSIS — R410 Disorientation, unspecified: Secondary | ICD-10-CM

## 2022-09-27 LAB — RENAL FUNCTION PANEL
Albumin: 2.7 g/dL — ABNORMAL LOW (ref 3.5–5.0)
Anion gap: 12 (ref 5–15)
BUN: 41 mg/dL — ABNORMAL HIGH (ref 8–23)
CO2: 26 mmol/L (ref 22–32)
Calcium: 10.3 mg/dL (ref 8.9–10.3)
Chloride: 98 mmol/L (ref 98–111)
Creatinine, Ser: 8.76 mg/dL — ABNORMAL HIGH (ref 0.44–1.00)
GFR, Estimated: 4 mL/min — ABNORMAL LOW (ref >60)
Glucose, Bld: 94 mg/dL (ref 70–99)
Phosphorus: 5.4 mg/dL — ABNORMAL HIGH (ref 2.5–4.6)
Potassium: 4.5 mmol/L (ref 3.5–5.1)
Sodium: 136 mmol/L (ref 135–145)

## 2022-09-27 LAB — HEPATIC FUNCTION PANEL
ALT: 8 U/L (ref 0–44)
AST: 17 U/L (ref 15–41)
Albumin: 2.6 g/dL — ABNORMAL LOW (ref 3.5–5.0)
Alkaline Phosphatase: 53 U/L (ref 38–126)
Bilirubin, Direct: <0.1 mg/dL (ref 0.0–0.2)
Total Bilirubin: 0.6 mg/dL (ref 0.3–1.2)
Total Protein: 6.4 g/dL — ABNORMAL LOW (ref 6.5–8.1)

## 2022-09-27 LAB — CBC WITH DIFFERENTIAL/PLATELET
Abs Immature Granulocytes: 0.01 10*3/uL (ref 0.00–0.07)
Basophils Absolute: 0 10*3/uL (ref 0.0–0.1)
Basophils Relative: 1 %
Eosinophils Absolute: 0.2 10*3/uL (ref 0.0–0.5)
Eosinophils Relative: 6 %
HCT: 28.1 % — ABNORMAL LOW (ref 36.0–46.0)
Hemoglobin: 9.7 g/dL — ABNORMAL LOW (ref 12.0–15.0)
Immature Granulocytes: 0 %
Lymphocytes Relative: 28 %
Lymphs Abs: 1.1 10*3/uL (ref 0.7–4.0)
MCH: 33.9 pg (ref 26.0–34.0)
MCHC: 34.5 g/dL (ref 30.0–36.0)
MCV: 98.3 fL (ref 80.0–100.0)
Monocytes Absolute: 0.4 10*3/uL (ref 0.1–1.0)
Monocytes Relative: 10 %
Neutro Abs: 2.2 10*3/uL (ref 1.7–7.7)
Neutrophils Relative %: 55 %
Platelets: 263 10*3/uL (ref 150–400)
RBC: 2.86 MIL/uL — ABNORMAL LOW (ref 3.87–5.11)
RDW: 18 % — ABNORMAL HIGH (ref 11.5–15.5)
WBC: 3.9 10*3/uL — ABNORMAL LOW (ref 4.0–10.5)
nRBC: 0 % (ref 0.0–0.2)

## 2022-09-27 MED ORDER — VANCOMYCIN HCL 500 MG/100ML IV SOLN
500.0000 mg | Freq: Once | INTRAVENOUS | Status: AC
  Administered 2022-09-27: 500 mg via INTRAVENOUS
  Filled 2022-09-27: qty 100

## 2022-09-27 MED ORDER — VANCOMYCIN HCL 500 MG/100ML IV SOLN: 500.0000 mg | Freq: Once | INTRAVENOUS | Status: DC

## 2022-09-27 MED ORDER — RENA-VITE PO TABS
1.0000 | ORAL_TABLET | Freq: Every day | ORAL | Status: DC
  Administered 2022-09-27 – 2022-10-01 (×5): 1 via ORAL
  Filled 2022-09-27 (×5): qty 1

## 2022-09-27 MED ORDER — BOOST / RESOURCE BREEZE PO LIQD CUSTOM
1.0000 | Freq: Three times a day (TID) | ORAL | Status: DC
  Administered 2022-10-01: 1 via ORAL

## 2022-09-27 MED ORDER — LORAZEPAM 2 MG/ML IJ SOLN
0.5000 mg | Freq: Once | INTRAMUSCULAR | Status: AC | PRN
  Administered 2022-09-27: 0.5 mg via INTRAVENOUS
  Filled 2022-09-27: qty 1

## 2022-09-27 MED ORDER — CHLORHEXIDINE GLUCONATE CLOTH 2 % EX PADS: 6.0000 | MEDICATED_PAD | Freq: Every day | CUTANEOUS | Status: DC

## 2022-09-27 MED ORDER — DIVALPROEX SODIUM 125 MG PO DR TAB
125.0000 mg | DELAYED_RELEASE_TABLET | Freq: Two times a day (BID) | ORAL | Status: DC
  Administered 2022-09-27 – 2022-09-29 (×4): 125 mg via ORAL
  Filled 2022-09-27 (×6): qty 1

## 2022-09-27 NOTE — Progress Notes (Signed)
PROGRESS NOTE   Diamond [name]  HYI:[id number]    DOB: 1951/07/19    DOA: 09/16/2022  PCP: [name], MD   I have briefly reviewed patients previous medical records in [facility].  Chief Complaint  Patient presents with   Fall   Back Pain    Brief Narrative:  71 y.o. female with medical history significant of ESRD on HD, PAF on eliquis, HTN, h/o recurrent MRSA bacteremia and vertebral discitis, presents the ED following a fall at home and some acute on chronic back pain.  Patient also noted to be intermittently confused, reported missing a dialysis session due to back pain as per patient.  In the ED, work-up for any traumatic injury was negative including CT head/spine.  While in the ED, patient noted to spike a temp, blood cultures were taken and patient was started on IV vancomycin and cefepime.  Of note, patient recently finished 4 weeks of IV ABx (looks like vanc) 8/8.  Readmitted 8/22 for acute on chronic back pain with MRI concerning for l2-3 paravertebral edema and still elevated crp.  Treated with another 4 weeks of Bactrim PO.  Due to history, ID was consulted, as well as nephrology for HD.  Patient admitted for further management.  Stable from bacteremia standpoint for discharge and outpatient management.  However ongoing issues with waxing and waning mental status changes/delirium, impeding on regular HD, patient refusing.  Psychiatry consulted for assistance 10/23, Depakote started.   Assessment & Plan:  Principal Problem:   Acute metabolic encephalopathy Active Problems:   Hypertensive urgency   ESRD on hemodialysis (HCC)   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    Paroxysmal atrial flutter (HCC)   Thyroid nodule   Empty sella (HCC)   PAF (paroxysmal atrial fibrillation) (HCC)   MRSA bacteremia with lumbar discitis/osteomyelitis and ventral epidural abscess and acute on chronic back pain Had a temp spike, with no leukocytosis BC x2  (10/13) growing MRSA.  Surveillance blood cultures x2 from 10/16: Negative. Procalcitonin 0.62, CRP elevated but decreasing, 27.4 > 19.3 > 22.3 > 11.8.  ESR: 98. UA negative Chest x-ray unremarkable Repeat Echo not done as patient refused and moreover echo 1 month ago with preserved function and no vegetations and ID suspects source is still lumbar discitis/osteomyelitis. CT lumbar spine without contrast unremarkable for any fracture or collection MRI lumbar spine showed progression of discitis with increased soft tissue extent of infection in the paraspinal and epidural spaces ID consulted, plan for 8 weeks of IV Vancomycin post HD. Neurosurgery/IR consulted no further work-up recommended Pain management and appears to be controlled. PT/OT- rec SNF, fall precautions   Hypertensive urgency Cont home PO hydralazine(alternating doses on HD and non HD days), lopressor, clonidine as needed.  May need to further titrate or adjust.  Patient has been missing intermittent doses of her antihypertensive, either at dialysis or may be somnolent for any meds or refusing.  Monitor closely.  Not sure if she is volume overloaded from missing multiple HD's.   Acute metabolic encephalopathy ??  Dementia, noted to be sundowning towards the end of the day Family reports some sundowning as well at home towards evening May be due to HTN encephalopathy vs infection Management as above.  Delirium precautions.  Ongoing confusion.  Discontinued Percocet and use nonopioids for pain management.  Mental status continues to fluctuate.  Yesterday she was quite alert, much oriented, pleasantly confused but cooperative and not agitated.  This afternoon again with HD,  agitated and not allowing them to get her on HD.  Trial of Ativan as needed for agitation.  UA 10/13, not indicative of UTI.  Avoiding Haldol due to prolonged QTc of 520,?  Related to amiodarone which is now discontinued.  Will check B12 (442), RPR (nonreactive) and  thiamine (reordered) levels for completion.  QTc 10/23 has improved to 462 ms.  Avoid QT prolonging medications.  Psychiatry consulted and recommend Depakote 125 Mg twice daily.  LFTs okay on 10/12 and need to be monitored periodically.   ESRD on hemodialysis ([place]) 1 missed dialysis session PTA Nephrology on board for HD needs.  Last had HD on 10/17.  Since then patient has been refusing HD.  Is supposed to get it today if she cooperates.  Anemia of ESRD/chronic disease Stable.  Leukopenia and thrombocytopenia: Thrombocytopenia resolved.  Leukopenia stable.?  Related to infection or antimicrobials.   Paroxysmal atrial flutter (HCC)/A-fib with RVR Noted to be in A-fib with RVR on 09/17/2022, s/p Cardizem drip Cont home metoprolol Cont home eliquis Post HD on 10/17, went into A-fib with RVR up to 160s, started on Cardizem drip > changed and consolidated to Cardizem CD. Recurrent issue, developed again 10/19 afternoon at HD, developed A-fib with mild RVR, cardiology evaluated, started amiodarone drip with hope of converting to NSR. EKG w/o acute changes. HS Trop 23>21.  On 10/20 at around 4:15 PM, reverted to sinus rhythm.  However per cardiology follow-up, discontinuing amiodarone due to QTc 520 >508 >462.  Continuing metoprolol 50 Mg twice daily and diltiazem CD120 Mg daily with hopes of maintaining sinus rhythm.  Cardiology signed off 10/22 Follows with [name], [place].   History of MRSA bacteremia and discitis  Pt finished 4 weeks IV Abx, followed by 4 more weeks of bactrim for discitis Followed by ID as an outpatient   Empty sella ([place]) Seen on CT Pt denies headache, recent visual changes Monitor   Thyroid nodule Thyroid ultrasound showed normal-sized thyroid with bilateral nodules, does not meet any criteria for biopsy    Body mass index is 18.16 kg/m.   DVT prophylaxis: apixaban (ELIQUIS) tablet 2.5 mg Start: 09/17/22 0315     Code Status: Full Code:  Family  Communication: Son via phone on 10/22. Disposition:  Status is: Inpatient Remains inpatient appropriate because: Bacteremia on IV antibiotics pending surveillance blood cultures to be negative, A-fib with RVR on Cardizem drip   As per discussion with daughter, at baseline patient has mild confusion and cognitive impairment.  She plans to come into the hospital maybe tomorrow and will advise [place] as to how her mother is doing as far as her mental status.  Consultants:   Nephrology Infectious disease Neurosurgery Cardiology  Procedures:     Antimicrobials:   As above   Subjective:  Overnight events noted.  Did not settle with 0.5 Mg of Ativan that was ordered and required an additional 0.5 Mg.  Somnolent this morning but barely arousable.  Mumbles incomprehensibly and returns to sleep.  Per nursing, earlier was oriented x2.  Objective:   Vitals:   09/27/22 0312 09/27/22 0803 09/27/22 0929 09/27/22 1129  BP: (!) 156/110   (!) 143/114  Pulse: 74   77  Resp: 19   17  Temp: 98.6 F (37 C)   98.6 F (37 C)  TempSrc: Oral   Oral  SpO2: 100% 100%  100%  Weight:      Height:   '5\' 7"'  (1.702 m)  General exam: Elderly female, moderately built, frail and chronically ill , lying comfortably supine in bed. Respiratory system: Clear to auscultation.  No increased work of breathing. Cardiovascular system: S1 & S2 heard, irregular irregular. No JVD, murmurs, rubs, gallops or clicks. No pedal edema.  Telemetry personally reviewed: Sinus rhythm. Gastrointestinal system: Abdomen is nondistended, soft and nontender. No organomegaly or masses felt. Normal bowel sounds heard. Central nervous system: Mental status as noted above. No focal neurological deficits. Extremities: Symmetric 5 x 5 power.  Left upper arm AV fistula. Skin: No rashes, lesions or ulcers Psychiatry: Judgement and insight appear impaired.  Mood & affect appropriate.     Data Reviewed:   I have personally reviewed  following labs and imaging studies   CBC: Recent Labs  Lab 09/25/22 0559 09/26/22 0343 09/27/22 0441  WBC 3.4* 5.2 3.9*  NEUTROABS 1.6* 3.1 2.2  HGB 8.5* 10.2* 9.7*  HCT 24.5* 29.1* 28.1*  MCV 98.0 97.7 98.3  PLT 218 268 330    Basic Metabolic Panel: Recent Labs  Lab 09/22/22 0349 09/23/22 0320 09/24/22 0344 09/25/22 0559 09/26/22 0343 09/27/22 0441  NA 135 135 135 134* 137 136  K 3.9 4.3 3.9 4.0 4.2 4.5  CL 95* 96* 96* 95* 98 98  CO2 '27 25 27 27 25 26  ' GLUCOSE 105* 103* 92 92 93 94  BUN 28* 36* 23 30* 34* 41*  CREATININE 5.22* 6.42* 4.91* 6.39* 7.54* 8.76*  CALCIUM 10.4* 10.3 9.7 10.4* 10.7* 10.3  MG 1.7  --   --   --   --   --   PHOS 3.9 5.2* 3.7 4.5 4.8* 5.4*    Liver Function Tests: Recent Labs  Lab 09/23/22 0320 09/24/22 0344 09/25/22 0559 09/26/22 0343 09/27/22 0441  ALBUMIN 2.5* 2.4* 2.5* 2.9* 2.7*    CBG: No results for input(s): "GLUCAP" in the last 168 hours.  Microbiology Studies:   Recent Results (from the past 240 hour(s))  Culture, blood (Routine X 2) w Reflex to ID Panel     Status: None   Collection Time: 09/20/22  3:02 PM   Specimen: BLOOD RIGHT HAND  Result Value Ref Range Status   Specimen Description BLOOD RIGHT HAND  Final   Special Requests   Final    BOTTLES DRAWN AEROBIC AND ANAEROBIC Blood Culture results may not be optimal due to an inadequate volume of blood received in culture bottles   Culture   Final    NO GROWTH 5 DAYS Performed at [facility], [place] [address] [place], [place] [address]    Report Status 09/25/2022 FINAL  Final  Culture, blood (Routine X 2) w Reflex to ID Panel     Status: None   Collection Time: 09/20/22  3:02 PM   Specimen: BLOOD RIGHT HAND  Result Value Ref Range Status   Specimen Description BLOOD RIGHT HAND  Final   Special Requests   Final    BOTTLES DRAWN AEROBIC AND ANAEROBIC Blood Culture adequate volume   Culture   Final    NO GROWTH 5 DAYS Performed at [facility],  [place] [address] [place], [place] [address]    Report Status 09/25/2022 FINAL  Final    Radiology Studies:  No results found.  Scheduled Meds:    apixaban  2.5 mg Oral BID   Chlorhexidine Gluconate Cloth  6 each Topical Q0600   [START ON 09/28/2022] darbepoetin (ARANESP) injection - DIALYSIS  200 mcg Intravenous Q Tue-HD   diltiazem  120 mg Oral Daily   divalproex  125 mg Oral Q12H   feeding supplement  1 Container Oral TID WC   hydrALAZINE  100 mg Oral 2 times per day on Tue Thu Sat   hydrALAZINE  100 mg Oral 3 times per day on Sun Mon Wed Fri   hydrocortisone cream   Topical TID   metoprolol tartrate  50 mg Oral BID   mometasone-formoterol  2 puff Inhalation BID   multivitamin  1 tablet Oral QHS   pantoprazole  40 mg Oral BID   sevelamer carbonate  1,600 mg Oral TID WC   vancomycin variable dose per unstable renal function (pharmacist dosing)   Does not apply See admin instructions    Continuous Infusions:     ceFAZolin (ANCEF) IV Stopped (09/26/22 2251)   vancomycin       LOS: 10 days     Vernell Leep, MD,  FACP, FHM, [facility], [facility], [place]     To contact the attending provider between 7A-7P or the covering provider during after hours 7P-7A, please log into the web site [email or web address] and access using universal [place] password for that web site. If you do not have the password, please call the hospital operator.  09/27/2022, 1:41 PM

## 2022-09-27 NOTE — Progress Notes (Signed)
Initial Nutrition Assessment  DOCUMENTATION CODES:  Non-severe (moderate) malnutrition in context of chronic illness  INTERVENTION:  Liberalize diet to regular with 2L fluid restriction Renavite daily Boost Breeze po BID, each supplement provides 250 kcal and 9 grams of protein  NUTRITION DIAGNOSIS:   Moderate Malnutrition related to chronic illness (ESRD on HD) as evidenced by moderate muscle depletion, moderate fat depletion, percent weight loss (12% x 6 months).  GOAL:   Patient will meet greater than or equal to 90% of their needs  MONITOR:   PO intake, Supplement acceptance, Labs, Weight trends  REASON FOR ASSESSMENT:   Malnutrition Screening Tool    ASSESSMENT:  Pt with hx of HTN, ESRD on HD, and gout presented to ED after a fall at home. Son reports pt has missed her last 2 HD sessions. Several recent admissions and has been followed by RD in the past.   Pt resting in bed at the time of assessment, lunch tray had just arrived. Assisted pt with tray setup and opening containers. So far, intake this admission has been poor. RN carrying breakfast tray out upon entry and it was untouched. Mental status waxing and waning as pt has not had HD since Thursday. Pt on restrictive diet, will liberalize until appetite improves to improve chances of better consumption.   Pt with significant muscle and fat depletions on exam. Endorses weight loss, 12.2% noted over the last 6 months (4/4-10/19) and poor energy recently. Noted that psychiatry and palliative care are both consulting to determine [place] and if pt has capacity at this time to refuse HD.  Average Meal Intake: 10/16-10/20: 25% intake x 6 recorded meals  Nutritionally Relevant Medications: Scheduled Meds:  pantoprazole  40 mg Oral BID   sevelamer carbonate  1,600 mg Oral TID WC   vancomycin variable dose per unstable renal function   Does not apply See admin instructions   Continuous Infusions:   ceFAZolin (ANCEF) IV 1 g  (09/26/22 2201)   PRN Meds: ondansetron  Labs Reviewed: BUN 41, creatinine 8.76 Phosphorus 5.4  Outpatient HD: TTS, LUA AVF EDW 53.5kg Last HD: 10/19 (refused 10/21) UF removed: 0.3L  NUTRITION - FOCUSED PHYSICAL EXAM: Flowsheet Row Most Recent Value  Orbital Region Mild depletion  Upper Arm Region Moderate depletion  Thoracic and Lumbar Region Moderate depletion  Buccal Region Mild depletion  Temple Region Mild depletion  Clavicle Bone Region Moderate depletion  Clavicle and Acromion Bone Region Severe depletion  Scapular Bone Region Moderate depletion  Dorsal Hand Severe depletion  Patellar Region Moderate depletion  Anterior Thigh Region Moderate depletion  Posterior Calf Region Moderate depletion  Edema (RD Assessment) None  Hair Reviewed  Eyes Reviewed  Mouth Reviewed  Skin Reviewed  Nails Reviewed   Diet Order:   Diet Order             Diet regular Room service appropriate? No; Fluid consistency: Thin; Fluid restriction: 2000 mL Fluid  Diet effective now                   EDUCATION NEEDS:  Not appropriate for education at this time  Skin:  Skin Assessment: Reviewed RN Assessment  Last BM:  10/22  Height:  Ht Readings from Last 1 Encounters:  09/27/22 '5\' 7"'$  (1.702 m)   Weight:  Wt Readings from Last 1 Encounters:  09/23/22 52.6 kg    Ideal Body Weight:  61.4 kg  BMI:  Body mass index is 18.16 kg/m.  Estimated Nutritional Needs:  Kcal:  1700-1900 kcal/d Protein:  85-100 g/d Fluid:  1L+UOP    Ranell Patrick, RD, LDN Clinical Dietitian RD pager # available in AMION  After hours/weekend pager # available in [facility]

## 2022-09-27 NOTE — Progress Notes (Addendum)
Received patient in bed to unit.  Alert and oriented to person Informed consent signed and in chart.   Treatment initiated: 1531 Treatment completed: 1906  Ended tx 30 minutes early, patient confused asking to leave, able to briefly redirect and console. Frequent left arm movements triggering A/V alarm pressures. Informed Dr Marval Regal ok with ending tx 30 minutes early  Transported back to the room. Unable to complete vancomycin infusion d/t RPIV not working, placed consult for IV nurse, 3W nurse informed during hand off.  Alert and confused without acute distress.  Hand-off given to patient's nurse.   Access used: fistula left arm  Access issues: none.   Total UF removed: 800 ml Medication(s) given: vancomycin partial amount  Post HD VS: 161/111 MAP 126 HR 79 RR 15 Sat 98% on room air Temp oral 98.1 Post HD weight: 51.3 kg   [facility]

## 2022-09-27 NOTE — Progress Notes (Signed)
Nephrology Progress Note:  Subjective:  She refused the last scheduled HD session.  Last completed HD on 10/19 with 0.3 kg UF.  She fell asleep this am as I was speaking with her.  I called her son - he shares she has always said that she would want to stop dialysis (ever since starting HD) but then she always goes because she knows that she needs it to stay alive.  He has been involved in her care per nursing.   Review of systems:  Unable to complete - patient does not answer questions   Objective Vital signs in last 24 hours: Vitals:   09/26/22 1929 09/26/22 2033 09/27/22 0312 09/27/22 0803  BP: (!) 136/95  (!) 156/110   Pulse: 75  74   Resp: 13  19   Temp: 98.7 F (37.1 C)  98.6 F (37 C)   TempSrc: Oral  Oral   SpO2: 100% 100% 100% 100%  Weight:      Height:        Physical Exam:  General elderly female in bed  HEENT normocephalic atraumatic Neck supple trachea midline Lungs clear to auscultation bilaterally normal work of breathing at rest  Heart S1S2 no rub Abdomen soft nontender nondistended Extremities no edema  Psych no anxiety or agitation but falls asleep easily  Neuro very sleepy. Awakens to voice and answers location, year, and her name but falls asleep multiple times (or closes eyes) Access LUE AVF with bruit and thrill        OP HD: [place] TTS 4h   400/600  53.5kg  2/2 bath  Hep none LUA AVF - last HD 10/10 post wt 54.1kg - last Hb 10.1 - mircera 100 q2, new start  - iron sucrose 50 mg weekly - doxercalciferol 3 ug iv tts      109/100, HR 90,  RR 15  97%    K+ 5.4  BUN 42  cr 7.53   Problem/Plan Acute metabolic encephalopathy -multiple etiology MRSA bacteremia with vertebral discitis/osteomyelitis, dementia, gabapentin.   Had not really missed any HD sessions, No sig azotemia.  her baseline is with some cognitive deficits and sundowning -  is worse in this setting and does not seem to be getting better-  will be unable to dialyze in the OP setting in  this condition  2.   ESRD - on HD TTS.  Usually good compliance.  Missed last Thursday, but had  Friday and Saturday to get back on schedule.  treatments T and Thurs complicated by Afib and hypotension -  intolerance of UF-  now seemingly more of behavioral issues. As above would not be able to do dialysis in the OP setting in this state.  Last HD was Thursday.   - HD today - she is not able to participate in goals of care discussions today as she repeatedly falls asleep during interview.  I have encouraged her son to have goals of care discussions with her as he is able   HTN /vol - optimize volume with HD  Progression of discitis/osteomyelitis L-spine on MRI on 09/17/2022/= ID consulting noted [facility] 10/13 1 of 4 bottles GPC, MRSA per [facility] ID on vancomycin, TTE ordered by ID and neurosurgery to eval repeat blood cultures 10/15 ordered/NS consulted noted no surgery needed currently-  will need vanc 500 mg per HD until 12/11 per ID Rapid A-fib IV Cardizem drip started by admit team 10/13 evening.  Per primary team  Anemia of esrd - On  ESA weekly which is now at 200 mcg every tuesday. Fe IV weekly held in setting of infection MBD ckd - CCa elevated, holding IV vdra. Cont renvela as binder.   Pt has recently mentioned she is considering stopping HD-  may be worth a palliative conversation ?    Disposition - per primary team   Labs: Basic Metabolic Panel: Recent Labs  Lab 09/25/22 0559 09/26/22 0343 09/27/22 0441  NA 134* 137 136  K 4.0 4.2 4.5  CL 95* 98 98  CO2 '27 25 26  '$ GLUCOSE 92 93 94  BUN 30* 34* 41*  CREATININE 6.39* 7.54* 8.76*  CALCIUM 10.4* 10.7* 10.3  PHOS 4.5 4.8* 5.4*   Liver Function Tests: Recent Labs  Lab 09/25/22 0559 09/26/22 0343 09/27/22 0441  ALBUMIN 2.5* 2.9* 2.7*   No results for input(s): "LIPASE", "AMYLASE" in the last 168 hours. No results for input(s): "AMMONIA" in the last 168 hours.  CBC: Recent Labs  Lab 09/23/22 0320 09/24/22 0344 09/25/22 0559  09/26/22 0343 09/27/22 0441  WBC 3.9* 3.6* 3.4* 5.2 3.9*  NEUTROABS 2.1 1.9 1.6* 3.1 2.2  HGB 9.1* 9.2* 8.5* 10.2* 9.7*  HCT 26.5* 27.0* 24.5* 29.1* 28.1*  MCV 98.5 98.2 98.0 97.7 98.3  PLT 189 225 218 268 263   Cardiac Enzymes: No results for input(s): "CKTOTAL", "CKMB", "CKMBINDEX", "TROPONINI" in the last 168 hours. CBG: No results for input(s): "GLUCAP" in the last 168 hours.     ceFAZolin (ANCEF) IV 1 g (09/26/22 2201)    apixaban  2.5 mg Oral BID   Chlorhexidine Gluconate Cloth  6 each Topical Q0600   [START ON 09/28/2022] darbepoetin (ARANESP) injection - DIALYSIS  200 mcg Intravenous Q Tue-HD   diltiazem  120 mg Oral Daily   hydrALAZINE  100 mg Oral 2 times per day on Tue Thu Sat   hydrALAZINE  100 mg Oral 3 times per day on Sun Mon Wed Fri   hydrocortisone cream   Topical TID   metoprolol tartrate  50 mg Oral BID   mometasone-formoterol  2 puff Inhalation BID   pantoprazole  40 mg Oral BID   sevelamer carbonate  1,600 mg Oral TID WC   vancomycin variable dose per unstable renal function (pharmacist dosing)   Does not apply See admin instructions     Claudia Desanctis, MD 8:53 AM 09/27/2022

## 2022-09-28 DIAGNOSIS — Z7189 Other specified counseling: Secondary | ICD-10-CM

## 2022-09-28 DIAGNOSIS — R410 Disorientation, unspecified: Secondary | ICD-10-CM | POA: Diagnosis not present

## 2022-09-28 DIAGNOSIS — N186 End stage renal disease: Secondary | ICD-10-CM | POA: Diagnosis not present

## 2022-09-28 DIAGNOSIS — F05 Delirium due to known physiological condition: Secondary | ICD-10-CM

## 2022-09-28 DIAGNOSIS — Z992 Dependence on renal dialysis: Secondary | ICD-10-CM | POA: Diagnosis not present

## 2022-09-28 DIAGNOSIS — R7881 Bacteremia: Secondary | ICD-10-CM | POA: Diagnosis not present

## 2022-09-28 DIAGNOSIS — G9341 Metabolic encephalopathy: Secondary | ICD-10-CM | POA: Diagnosis not present

## 2022-09-28 LAB — RENAL FUNCTION PANEL
Albumin: 2.8 g/dL — ABNORMAL LOW (ref 3.5–5.0)
Anion gap: 13 (ref 5–15)
BUN: 14 mg/dL (ref 8–23)
CO2: 29 mmol/L (ref 22–32)
Calcium: 10.1 mg/dL (ref 8.9–10.3)
Chloride: 96 mmol/L — ABNORMAL LOW (ref 98–111)
Creatinine, Ser: 4.5 mg/dL — ABNORMAL HIGH (ref 0.44–1.00)
GFR, Estimated: 10 mL/min — ABNORMAL LOW (ref >60)
Glucose, Bld: 91 mg/dL (ref 70–99)
Phosphorus: 3.2 mg/dL (ref 2.5–4.6)
Potassium: 3.9 mmol/L (ref 3.5–5.1)
Sodium: 138 mmol/L (ref 135–145)

## 2022-09-28 LAB — CBC WITH DIFFERENTIAL/PLATELET
Abs Immature Granulocytes: 0.01 10*3/uL (ref 0.00–0.07)
Basophils Absolute: 0 10*3/uL (ref 0.0–0.1)
Basophils Relative: 1 %
Eosinophils Absolute: 0.2 10*3/uL (ref 0.0–0.5)
Eosinophils Relative: 6 %
HCT: 32 % — ABNORMAL LOW (ref 36.0–46.0)
Hemoglobin: 10.6 g/dL — ABNORMAL LOW (ref 12.0–15.0)
Immature Granulocytes: 0 %
Lymphocytes Relative: 25 %
Lymphs Abs: 1 10*3/uL (ref 0.7–4.0)
MCH: 33.5 pg (ref 26.0–34.0)
MCHC: 33.1 g/dL (ref 30.0–36.0)
MCV: 101.3 fL — ABNORMAL HIGH (ref 80.0–100.0)
Monocytes Absolute: 0.5 10*3/uL (ref 0.1–1.0)
Monocytes Relative: 11 %
Neutro Abs: 2.3 10*3/uL (ref 1.7–7.7)
Neutrophils Relative %: 57 %
Platelets: 239 10*3/uL (ref 150–400)
RBC: 3.16 MIL/uL — ABNORMAL LOW (ref 3.87–5.11)
RDW: 17.8 % — ABNORMAL HIGH (ref 11.5–15.5)
WBC: 4 10*3/uL (ref 4.0–10.5)
nRBC: 0 % (ref 0.0–0.2)

## 2022-09-28 MED ORDER — LOSARTAN POTASSIUM 25 MG PO TABS
25.0000 mg | ORAL_TABLET | Freq: Every day | ORAL | Status: DC
  Administered 2022-09-28 – 2022-10-01 (×4): 25 mg via ORAL
  Filled 2022-09-28 (×4): qty 1

## 2022-09-28 MED ORDER — VANCOMYCIN HCL 500 MG/100ML IV SOLN
500.0000 mg | INTRAVENOUS | Status: DC
  Administered 2022-09-28 – 2022-09-30 (×2): 500 mg via INTRAVENOUS
  Filled 2022-09-28 (×4): qty 100

## 2022-09-28 NOTE — Progress Notes (Signed)
Occupational Therapy Treatment Patient Details Name: Diamond [name] MRN: [id number] DOB: [birth year] Today's Date: 09/28/2022   History of present illness The pt is a 71 yo female presenting 10/12 with AMS after missing x2 HD treatments and sustaining a mechanical fall at home. Pt found to have HTN emergency, and MRI showed progression of discitis/osteomyelitis at L2-3 and L3-4 with stable compression fx of L2, L3, L4, and L5. Neurosurgery recommends medical management, non-op. PMH includes: anemia, asthma, atrial flutter, ESRD on HD, gout, and HTN.   OT comments  Pt making very slow progress toward adl goals. Pt with pain in back attempting to sit on EOB to feed self this am. Pt would follow some command but not all and had very short attention span for task. Pt required constant redirection for all tasks and could not tolerate sitting on EOB to feed self without +1 assist to hold self up and another +1 max assist to attempt feeding.  Pt very fatigued, weak, and pain in back due to compression fxs. Pt came from home, living alone.  This is a big change in pts independence and therefore feel with mental status and functional mobility limitations, pt continues to need SNF being pt is max assist x2 for transfers and +2 for most mobility and adls.   Recommendations for follow up therapy are one component of a multi-disciplinary discharge planning process, led by the attending physician.  Recommendations may be updated based on patient status, additional functional criteria and insurance authorization.    Follow Up Recommendations  Skilled nursing-short term rehab (<3 hours/day)    Assistance Recommended at Discharge Frequent or constant Supervision/Assistance  Patient can return home with the following  A lot of help with walking and/or transfers;A lot of help with bathing/dressing/bathroom;Assistance with cooking/housework;Direct supervision/assist for medications management;Help with stairs or ramp  for entrance;Assist for transportation;Direct supervision/assist for financial management   Equipment Recommendations  Other (comment) (tbd)    Recommendations for Other Services      Precautions / Restrictions Precautions Precautions: Fall Other Brace: Spinal precautions for comfort Restrictions Weight Bearing Restrictions: No       Mobility Bed Mobility Overal bed mobility: Needs Assistance Bed Mobility: Rolling, Sidelying to Sit, Sit to Supine Rolling: Max assist Sidelying to sit: Max assist   Sit to supine: Max assist, +2 for physical assistance   General bed mobility comments: Pt not always understanding goal of sitting on EOB to eat and what goal was when lying back down therefore tended to to resist movements that therapists were assisting with.    Transfers                   General transfer comment: Pt stayed EOB to eat.  Deferred transfers for today.     Balance Overall balance assessment: Needs assistance Sitting-balance support: Bilateral upper extremity supported, Feet supported Sitting balance-Leahy Scale: Poor Sitting balance - Comments: Pt with R lean onto elbow throughout session. Postural control: Right lateral lean                                 ADL either performed or assessed with clinical judgement   ADL Overall ADL's : Needs assistance/impaired Eating/Feeding: Sitting;Total assistance Eating/Feeding Details (indicate cue type and reason): Pt sat EOB with assist from PT while OT worked on feeding. Pt did less than 15% of feeding. Grooming: Total assistance  Functional mobility during ADLs: Moderate assistance;+2 for physical assistance General ADL Comments: mod A x2 to get to EOB and max assist to attempt to stay sitting on EOB.    Extremity/Trunk Assessment Upper Extremity Assessment Upper Extremity Assessment: Generalized weakness RUE Deficits / Details: actively using to  scratch back RUE Coordination: WNL LUE Deficits / Details: Pt used for normal movment patterns but not as much to command. Pt got L arm stuck behind her while lying down and did not appear to realize this but diffucult to assess if just lethargic and tired of sitting or if pt was neglecting it.   Lower Extremity Assessment Lower Extremity Assessment: Defer to PT evaluation        Vision   Vision Assessment?: Vision impaired- to be further tested in functional context Additional Comments: Pt identified what was on tray but had a hard time finding her fork and bacon to command.  Again, pt was lehthargic and attn. span short so difficult to know if attn or vision.   Perception Perception Perception: Not tested   Praxis Praxis Praxis: Not tested    Cognition Arousal/Alertness: Lethargic Behavior During Therapy: Flat affect Overall Cognitive Status: Difficult to assess Area of Impairment: Attention, Memory, Following commands, Awareness, Problem solving                 Orientation Level: Time Current Attention Level: Sustained Memory: Decreased recall of precautions, Decreased short-term memory Following Commands: Follows one step commands inconsistently Safety/Judgement: Decreased awareness of safety, Decreased awareness of deficits Awareness: Intellectual Problem Solving: Slow processing, Requires verbal cues, Requires tactile cues, Decreased initiation, Difficulty sequencing General Comments: Pt had eyes open for part of session but then would close them while sitting on EOB eating. Difficult to keep patient engaged for more than a few seconds at a time despite having food as motivator.        Exercises      Shoulder Instructions       General Comments Pt lethargic today. pt did converse appropriately at times but at other times would doze off and not answer.    Pertinent Vitals/ Pain       Pain Assessment Pain Assessment: Faces Faces Pain Scale: Hurts little  more Pain Location: low back Pain Descriptors / Indicators: Grimacing, Guarding Pain Intervention(s): Limited activity within patient's tolerance, Monitored during session, Repositioned, Other (comment) (spoke to nursing about getting pain meds on board. MD stated she could have tylenol but has not ordered)  Home Living                                          Prior Functioning/Environment              Frequency  Min 2X/week        Progress Toward Goals  OT Goals(current goals can now be found in the care plan section)  Progress towards OT goals: Progressing toward goals  Acute Rehab OT Goals Patient Stated Goal: none stated OT Goal Formulation: With patient Time For Goal Achievement: 10/03/22 Potential to Achieve Goals: Fair ADL Goals Pt Will Perform Grooming: with set-up;sitting Pt Will Perform Upper Body Dressing: with min assist;sitting Pt Will Perform Lower Body Dressing: with mod assist;sit to/from stand Pt Will Transfer to Toilet: with mod assist;stand pivot transfer;bedside commode Additional ADL Goal #1: Pt will sit EOB 5+ minutes with supervision as precursor to ADL  Plan Discharge plan remains appropriate;Frequency remains appropriate    Co-evaluation    PT/OT/SLP Co-Evaluation/Treatment: Yes Reason for Co-Treatment: Complexity of the patient's impairments (multi-system involvement) PT goals addressed during session: Mobility/safety with mobility OT goals addressed during session: ADL's and self-care      AM-PAC OT "6 Clicks" Daily Activity     Outcome Measure   Help from another person eating meals?: Total Help from another person taking care of personal grooming?: A Lot Help from another person toileting, which includes using toliet, bedpan, or urinal?: Total Help from another person bathing (including washing, rinsing, drying)?: Total Help from another person to put on and taking off regular upper body clothing?: Total Help from  another person to put on and taking off regular lower body clothing?: Total 6 Click Score: 7    End of Session    OT Visit Diagnosis: Unsteadiness on feet (R26.81);Other abnormalities of gait and mobility (R26.89);Muscle weakness (generalized) (M62.81);Other symptoms and signs involving cognitive function   Activity Tolerance Patient limited by pain   Patient Left in bed;with call bell/phone within reach;with bed alarm set   Nurse Communication Mobility status        Time: 1610-9604 OT Time Calculation (min): 26 min  Charges: OT General Charges $OT Visit: 1 Visit OT Treatments $Self Care/Home Management : 8-22 mins   Glenford Peers 09/28/2022, 9:21 AM

## 2022-09-28 NOTE — Progress Notes (Signed)
PHARMACY ANTIBIOTIC CONSULT NOTE   Diamond [name] a 71 y.o. female admitted with MRSA discitis/osteomyelitis.  Patient is ESRD on HD TTS and is supposed to receive vancomycin with HD through 11/15/22 per ID recommendations. Recently patient has been unable to tolerate full HD sessions due to hypotension and refuses some HD sessions. Plan is to get her back on home schedule of TTS today.  Nephrology team added cefazolin for possible AVF site infection to add additional coverage to vancomycin.   10/21 pre-HD vanc level: 13 and subtherapeutic - 500 mg was given (expected level after this dose ~24).  Estimated Creatinine Clearance: 9.3 mL/min (A) (by C-G formula based on SCr of 4.5 mg/dL (H)).  Plan: Vancomycin 500 mg IV after HD on TTS - please call pharmacy to reschedule if refuses HD Check preHD level at steady-state Cefazolin 1g IV q24h x7d  F/U HD tolerability   Allergies:  Allergies  Allergen Reactions   Shellfish Allergy Other (See Comments)    Unknown reaction    Other Rash    Pt states anesthesia makes her brake out in rash    Filed Weights   09/23/22 1744 09/27/22 1522 09/27/22 1959  Weight: 52.6 kg (115 lb 15.4 oz) 52 kg (114 lb 10.2 oz) 52 kg (114 lb 10.2 oz)       Latest Ref Rng & Units 09/28/2022    6:49 AM 09/27/2022    4:41 AM 09/26/2022    3:43 AM  CBC  WBC 4.0 - 10.5 K/uL 4.0  3.9  5.2   Hemoglobin 12.0 - 15.0 g/dL 10.6  9.7  10.2   Hematocrit 36.0 - 46.0 % 32.0  28.1  29.1   Platelets 150 - 400 K/uL 239  263  268     Antibiotics Given (last 72 hours)     Date/Time Action Medication Dose Rate   09/25/22 1723 New Bag/Given   vancomycin (VANCOREADY) IVPB 500 mg/100 mL 500 mg 100 mL/hr   09/25/22 1838 New Bag/Given   ceFAZolin (ANCEF) IVPB 1 g/50 mL premix 1 g 100 mL/hr   09/26/22 2201 New Bag/Given   ceFAZolin (ANCEF) IVPB 1 g/50 mL premix 1 g 100 mL/hr   09/27/22 1847 New Bag/Given   vancomycin (VANCOREADY) IVPB 500 mg/100 mL 500 mg 100 mL/hr    09/27/22 2143 New Bag/Given   ceFAZolin (ANCEF) IVPB 1 g/50 mL premix 1 g 100 mL/hr       Thank you for involving pharmacy in this patient's care.  Renold Genta, PharmD, BCPS Clinical Pharmacist Clinical phone for 09/28/2022 until 3p is [phone] 09/28/2022 1:16 PM

## 2022-09-28 NOTE — Consult Note (Signed)
Consultation Note Date: 09/28/2022   Patient Name: Diamond [name]  DOB: 1951/06/14  MRN: [id number]  Age / Sex: 71 y.o., female  PCP: [name], MD Referring Physician: Modena Jansky, MD  Reason for Consultation:  ESRD on HD with bacteremia, ongoing agitation and confusion issues impeding HD, has refused multiple times. Has reportedly told people she doesn't want HD  HPI/Patient Profile: 71 y.o. female  with past medical history of ESRD on HD, gall bladder mass with radical cholecystectomy this past July, PAF, HTN, vertebral discitis admitted on 09/16/2022 with fall at home, confusion. Workup revealed recurrent MRSA bacteremia requiring IV antibiotics until December. Admission has been complicated by mental status impeding HD. Seen by psych and started on Depakote. Palliative medicine consulted for above.    Primary Decision Maker NEXT OF KIN - son Diamond [name]  Discussion: Chart reviewed including labs notes imaging and progress notes from this and previous admissions.  Evaluated patient.  She was awake, although very lethargic.  She fell asleep during our conversation.  She was able to tell me she lives at home and she enjoys spending time with her grandchildren.  I asked her how she felt about going to dialysis.  She shared that she said it is something she does not particularly enjoy, however she understands that she needs that in order to keep herself alive.  And she does want to continue living.  She likened it to going to school, she said its not something she likes, but understands the necessity, and wants to continue.  I called her son Diamond [name] for further discussion.  Diamond [name] shares its been a difficult year for his mom.  He notes that he talked to her earlier about her feelings regarding dialysis and the discussion he had was the same that I had as above.  We discussed her recurrent  bacteremia and infection, and the fact that even with aggressive IV antibiotics it is possible that it will continue to occcur and affect her ability to increase her functional status.  We also discussed her need to be able to sit for outpatient dialysis in order to continue.  Diamond [name] notes that she sundowns and this contributes to her confusion.  He is willing to try and sit with her during her dialysis session to assist.  SUMMARY OF RECOMMENDATIONS   -Recommend continuing full scope care including dialysis -She is planned for second shift dialysis today-in the future it may be better to do her dialysis during first shift due to her sundowning in the afternoons and evenings -If she continues to be lethargic recommend reviewing her Depakote dose in reducing if possible -Diamond [name] plans to accompany her to dialysis in hopes of helping with her confusion during dialysis -She was referred for outpatient palliative in February of this year, appears visits were attempted to be made but patient declined services-we will discuss with Diamond [name] possible reconsulting outpatient palliative -PMT will continue to follow for progress versus decompensation and readdress goals of care as needed  Code Status/Advance Care Planning:  Full code   Prognosis:   Unable to determine  Discharge Planning: [place] for rehab with Palliative care service follow-up  Primary Diagnoses: Present on Admission:  Acute metabolic encephalopathy  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   Hypertensive urgency  Paroxysmal atrial flutter (HCC)  Thyroid nodule  Empty sella ([place])   Review of Systems  Constitutional:  Positive for activity change and fatigue.    Physical Exam Vitals and nursing note reviewed.  Constitutional:      Comments: Frail  Cardiovascular:     Rate and Rhythm: Normal rate.  Pulmonary:     Effort: Pulmonary effort is normal.  Musculoskeletal:     Comments:  Generalized weakness     Vital Signs: BP (!) 198/95 (BP Location: Left Leg)   Pulse 69   Temp 98 F (36.7 C) (Oral)   Resp 17   Ht '5\' 7"'$  (1.702 m) Comment: per chart review  Wt 52 kg   SpO2 100%   BMI 17.96 kg/m  Pain Scale: 0-10 POSS *See Group Information*: 3-INTERVENTION REQUIRED,Unacceptable,Frequently drowsy, arousable, drifts off to sleep during conversation Pain Score: 0-No pain   SpO2: SpO2: 100 % O2 Device:SpO2: 100 % O2 Flow Rate: .O2 Flow Rate (L/min): 2 L/min  IO: Intake/output summary:  Intake/Output Summary (Last 24 hours) at 09/28/2022 1038 Last data filed at 09/27/2022 1906 Gross per 24 hour  Intake 117 ml  Output 0.8 ml  Net 116.2 ml    LBM: Last BM Date : 09/26/22 Baseline Weight: Weight: 50 kg Most recent weight: Weight: 52 kg       Thank you for this consult. Palliative medicine will continue to follow and assist as needed.   Greater than 50%  of this time was spent counseling and coordinating care related to the above assessment and plan.  Signed by: Mariana Kaufman, AGNP-C Palliative Medicine    Please contact Palliative Medicine Team phone at [phone] for questions and concerns.  For individual provider: See Shea Evans

## 2022-09-28 NOTE — Progress Notes (Signed)
   09/28/22 1741  Vitals  Temp 97.8 F (36.6 C)  Pulse Rate 64  Resp 14  BP (!) 141/112  SpO2 100 %  O2 Device Room Air  Oxygen Therapy  Patient Activity (if Appropriate) In bed  Post Treatment  Dialyzer Clearance Clear  Duration of HD Treatment -hour(s) 1.1 hour(s)  Hemodialysis Intake (mL) 0 mL  Liters Processed 26.5  Fluid Removed 100 mL  Tolerated HD Treatment Yes   Received patient in bed to [name].  Alert and oriented.  Informed consent signed and in chart.    Patient tolerated well.  Transported back to the room  Alert, without acute distress.  Hand-off given to patient's nurse.   Access used: LUA AVF Access issues: pt orientation  Total UF removed: 156m Medication(s) given: Aranesp  Pt terminated tx early per request did not want to run encouraged pt to run continued to decline.   Diamond [name]

## 2022-09-28 NOTE — Progress Notes (Signed)
PROGRESS NOTE   Diamond [name]  HDQ:[id number]    DOB: [birth year]    DOA: 09/16/2022  PCP: [name], MD   I have briefly reviewed patients previous medical records in [facility].  Chief Complaint  Patient presents with   Fall   Back Pain    Brief Narrative:  71 y.o. female with medical history significant of ESRD on HD, PAF on eliquis, HTN, h/o recurrent MRSA bacteremia and vertebral discitis, presents the ED following a fall at home and some acute on chronic back pain.  Patient also noted to be intermittently confused, reported missing a dialysis session due to back pain as per patient.  In the ED, work-up for any traumatic injury was negative including CT head/spine.  While in the ED, patient noted to spike a temp, blood cultures were taken and patient was started on IV vancomycin and cefepime.  Of note, patient recently finished 4 weeks of IV ABx (looks like vanc) 8/8.  Readmitted 8/22 for acute on chronic back pain with MRI concerning for l2-3 paravertebral edema and still elevated crp.  Treated with another 4 weeks of Bactrim PO.  Due to history, ID was consulted, as well as nephrology for HD.  Patient admitted for further management.  Stable from bacteremia standpoint for discharge and outpatient management.  However ongoing issues with waxing and waning mental status changes/delirium, impeding on regular HD, patient refusing.  Psychiatry consulted for assistance 10/23, Depakote started.   Assessment & Plan:  Principal Problem:   Acute metabolic encephalopathy Active Problems:   Hypertensive urgency   ESRD on hemodialysis (HCC)   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    Paroxysmal atrial flutter (HCC)   Thyroid nodule   Empty sella (HCC)   PAF (paroxysmal atrial fibrillation) (HCC)   MRSA bacteremia with lumbar discitis/osteomyelitis and ventral epidural abscess and acute on chronic back pain Had a temp spike, with no leukocytosis BC x2  (10/13) growing MRSA.  Surveillance blood cultures x2 from 10/16: Negative. Procalcitonin 0.62, CRP elevated but decreasing, 27.4 > 19.3 > 22.3 > 11.8.  ESR: 98. UA negative Chest x-ray unremarkable Repeat Echo not done as patient refused and moreover echo 1 month ago with preserved function and no vegetations and ID suspects source is still lumbar discitis/osteomyelitis. CT lumbar spine without contrast unremarkable for any fracture or collection MRI lumbar spine showed progression of discitis with increased soft tissue extent of infection in the paraspinal and epidural spaces ID consulted, plan for 8 weeks of IV Vancomycin post HD. Neurosurgery/IR consulted no further work-up recommended Pain management and appears to be controlled. PT/OT- rec SNF, fall precautions   Hypertensive urgency Cont home PO hydralazine(alternating doses on HD and non HD days), lopressor, clonidine as needed.  May need to further titrate or adjust.  Patient has been missing intermittent doses of her antihypertensive, either at dialysis or may be somnolent for any meds or refusing.  Monitor closely.  Not sure if she is volume overloaded from missing multiple HD's.  Nephrology has started losartan 25 Mg daily.  BP is better today.   Acute metabolic encephalopathy ??  Dementia, noted to be sundowning towards the end of the day Family reports some sundowning as well at home towards evening May be due to HTN encephalopathy vs infection Management as above.  Delirium precautions.  Ongoing confusion.  Discontinued Percocet and use nonopioids for pain management.  Mental status continues to fluctuate.  Yesterday she was quite alert, much oriented,  pleasantly confused but cooperative and not agitated.  This afternoon again with HD, agitated and not allowing them to get her on HD.  Trial of Ativan as needed for agitation.  UA 10/13, not indicative of UTI.  Avoiding Haldol due to prolonged QTc of 520,?  Related to amiodarone  which is now discontinued.  Will check B12 (442), RPR (nonreactive) and thiamine (reordered) levels for completion.  QTc 10/23 has improved to 462 ms.  Avoid QT prolonging medications.  Psychiatry consulted and recommend Depakote 125 Mg twice daily.  LFTs okay on 10/12 and need to be monitored periodically.  Has only received 2 doses of Depakote thus far.  If patient's mental status stabilizes i.e. consistently alert, cooperative with care including dialysis, then she should be able to DC to SNF.  Had HD on 10/23, ended 30 minutes early due to confusion and asking to leave.  Has again gone for HD today (to get back on TTS schedule and to optimize mental status) and will have to see if she cooperates.  Agree with palliative care recommendations: Needs to dialyze first shift to avoid sundowning in the afternoons or evenings, son plans to accompany her to dialysis in hopes of helping her confusion.   ESRD on hemodialysis [facility] Nephrology on board for HD needs.  Patient had been agitated or refusing HD until she had a dialysis on 10/23 and has again gone for dialysis now.  As indicated above, her behavioral issues are currently limiting dialysis and hence DC to SNF.  If this stabilizes then she may be discharged to SNF.  Anemia of ESRD/chronic disease Stable.  Leukopenia and thrombocytopenia: Thrombocytopenia resolved.  Leukopenia stable.?  Related to infection or antimicrobials.   Paroxysmal atrial flutter (HCC)/A-fib with RVR Noted to be in A-fib with RVR on 09/17/2022, s/p Cardizem drip Cont home metoprolol Cont home eliquis Post HD on 10/17, went into A-fib with RVR up to 160s, started on Cardizem drip > changed and consolidated to Cardizem CD. Recurrent issue, developed again 10/19 afternoon at HD, developed A-fib with mild RVR, cardiology evaluated, started amiodarone drip with hope of converting to NSR. EKG w/o acute changes. HS Trop 23>21.  On 10/20 at around 4:15 PM, reverted to sinus rhythm.   However per cardiology follow-up, discontinuing amiodarone due to QTc 520 >508 >462.  Continuing metoprolol 50 Mg twice daily and diltiazem CD120 Mg daily with hopes of maintaining sinus rhythm.  Cardiology signed off 10/22 Follows with [name], [place].   History of MRSA bacteremia and discitis  Pt finished 4 weeks IV Abx, followed by 4 more weeks of bactrim for discitis Followed by ID as an outpatient   Empty sella ([place]) Seen on CT Pt denies headache, recent visual changes Monitor   Thyroid nodule Thyroid ultrasound showed normal-sized thyroid with bilateral nodules, does not meet any criteria for biopsy  Goals of care: Palliative care input 10/24 appreciated.  Remains full code and full scope of care including dialysis.  Refer to their detailed note from 10/24.  It is too early for Depakote to cause her to be lethargic, just received 2 doses thus far.    Body mass index is 17.96 kg/m.   DVT prophylaxis: apixaban (ELIQUIS) tablet 2.5 mg Start: 09/17/22 0315     Code Status: Full Code:  Family Communication: None at bedside today. Disposition:  Status is: Inpatient Remains inpatient appropriate because: Bacteremia on IV antibiotics pending surveillance blood cultures to be negative, A-fib with RVR on Cardizem drip  As per discussion with daughter, at baseline patient has mild confusion and cognitive impairment.  She plans to come into the hospital maybe tomorrow and will advise [place] as to how her mother is doing as far as her mental status.  Consultants:   Nephrology Infectious disease Neurosurgery Cardiology  Procedures:     Antimicrobials:   As above   Subjective:  Seen this morning.  Much more alert and oriented.  Oriented x2.  Asking if I have any good news for her.  Advised her that if she were to cooperate with taking her meds consistently and with dialysis, she can then be discharged.  Objective:   Vitals:   09/28/22 1530 09/28/22 1550 09/28/22  1630 09/28/22 1700  BP: (!) 147/92 (!) 139/96 121/74 (!) 86/62  Pulse: 68 63 67 67  Resp: '14 16 13 17  ' Temp: 97.7 F (36.5 C)     TempSrc: Oral     SpO2: 100% 100% 100% 100%  Weight:      Height:        General exam: Elderly female, moderately built, frail and chronically ill , sit up comfortably in bed this morning attempting to eat her breakfast.  Holding a fork and trying to eat scrambled egg. Respiratory system: Clear to auscultation.  No increased work of breathing. Cardiovascular system: S1 and S2 heard, RRR.  No JVD, murmurs or pedal edema.  Telemetry personally reviewed: Sinus rhythm. Gastrointestinal system: Abdomen is nondistended, soft and nontender. No organomegaly or masses felt. Normal bowel sounds heard. Central nervous system: Mental status as noted above. No focal neurological deficits. Extremities: Symmetric 5 x 5 power.  Left upper arm AV fistula. Skin: No rashes, lesions or ulcers Psychiatry: Judgement and insight appear impaired.  Mood & affect appropriate.     Data Reviewed:   I have personally reviewed following labs and imaging studies   CBC: Recent Labs  Lab 09/26/22 0343 09/27/22 0441 09/28/22 0649  WBC 5.2 3.9* 4.0  NEUTROABS 3.1 2.2 2.3  HGB 10.2* 9.7* 10.6*  HCT 29.1* 28.1* 32.0*  MCV 97.7 98.3 101.3*  PLT 268 263 537    Basic Metabolic Panel: Recent Labs  Lab 09/22/22 0349 09/23/22 0320 09/24/22 0344 09/25/22 0559 09/26/22 0343 09/27/22 0441 09/28/22 0650  NA 135   < > 135 134* 137 136 138  K 3.9   < > 3.9 4.0 4.2 4.5 3.9  CL 95*   < > 96* 95* 98 98 96*  CO2 27   < > '27 27 25 26 29  ' GLUCOSE 105*   < > 92 92 93 94 91  BUN 28*   < > 23 30* 34* 41* 14  CREATININE 5.22*   < > 4.91* 6.39* 7.54* 8.76* 4.50*  CALCIUM 10.4*   < > 9.7 10.4* 10.7* 10.3 10.1  MG 1.7  --   --   --   --   --   --   PHOS 3.9   < > 3.7 4.5 4.8* 5.4* 3.2   < > = values in this interval not displayed.    Liver Function Tests: Recent Labs  Lab  09/24/22 0344 09/25/22 0559 09/26/22 0343 09/27/22 0441 09/28/22 0650  AST  --   --   --  17  --   ALT  --   --   --  8  --   ALKPHOS  --   --   --  53  --   BILITOT  --   --   --  0.6  --   PROT  --   --   --  6.4*  --   ALBUMIN 2.4* 2.5* 2.9* 2.6*  2.7* 2.8*    CBG: No results for input(s): "GLUCAP" in the last 168 hours.  Microbiology Studies:   Recent Results (from the past 240 hour(s))  Culture, blood (Routine X 2) w Reflex to ID Panel     Status: None   Collection Time: 09/20/22  3:02 PM   Specimen: BLOOD RIGHT HAND  Result Value Ref Range Status   Specimen Description BLOOD RIGHT HAND  Final   Special Requests   Final    BOTTLES DRAWN AEROBIC AND ANAEROBIC Blood Culture results may not be optimal due to an inadequate volume of blood received in culture bottles   Culture   Final    NO GROWTH 5 DAYS Performed at [facility], [place] [address] [place], [place] [address]    Report Status 09/25/2022 FINAL  Final  Culture, blood (Routine X 2) w Reflex to ID Panel     Status: None   Collection Time: 09/20/22  3:02 PM   Specimen: BLOOD RIGHT HAND  Result Value Ref Range Status   Specimen Description BLOOD RIGHT HAND  Final   Special Requests   Final    BOTTLES DRAWN AEROBIC AND ANAEROBIC Blood Culture adequate volume   Culture   Final    NO GROWTH 5 DAYS Performed at [facility], [place] [address] [place], [place] [address]    Report Status 09/25/2022 FINAL  Final    Radiology Studies:  No results found.  Scheduled Meds:    apixaban  2.5 mg Oral BID   Chlorhexidine Gluconate Cloth  6 each Topical Q0600   darbepoetin (ARANESP) injection - DIALYSIS  200 mcg Intravenous Q Tue-HD   diltiazem  120 mg Oral Daily   divalproex  125 mg Oral Q12H   feeding supplement  1 Container Oral TID WC   hydrALAZINE  100 mg Oral 2 times per day on Tue Thu Sat   hydrALAZINE  100 mg Oral 3 times per day on Sun Mon Wed Fri   hydrocortisone cream   Topical TID   losartan   25 mg Oral Daily   metoprolol tartrate  50 mg Oral BID   mometasone-formoterol  2 puff Inhalation BID   multivitamin  1 tablet Oral QHS   pantoprazole  40 mg Oral BID   sevelamer carbonate  1,600 mg Oral TID WC    Continuous Infusions:     ceFAZolin (ANCEF) IV Stopped (09/27/22 2215)   vancomycin       LOS: 11 days     Vernell Leep, MD,  FACP, FHM, SFHM, [facility], [facility]   [facility] [place]     To contact the attending provider between 7A-7P or the covering provider during after hours 7P-7A, please log into the web site [email or web address] and access using universal [place] password for that web site. If you do not have the password, please call the hospital operator.  09/28/2022, 5:17 PM

## 2022-09-28 NOTE — Progress Notes (Signed)
Nephrology Progress Note:  Subjective:   She had last HD on 10/23 with 0.8 kg UF.  Per RN note she ended treatment 30 minutes early as patient was confused and asking to leave; she was initially redirected.  Had frequent arm movements which triggered alarms.  She was more alert today and spoke with me this am.  Her arm was resting in her eggs on her breakfast plate and she declined assistance with moving it twice.    Review of systems:   Limited  Denies air hunger  Breakfast is here - she hasn't eaten yet    Objective Vital signs in last 24 hours: Vitals:   09/27/22 2056 09/27/22 2107 09/28/22 0000 09/28/22 0400  BP:   (!) 183/85 (!) 195/76  Pulse: 78 78 73 67  Resp:  16    Temp: 98.8 F (37.1 C)  98.6 F (37 C) 98.7 F (37.1 C)  TempSrc: Oral  Oral Axillary  SpO2:  98% 99% 100%  Weight:      Height:        Physical Exam:    General elderly female in bed  HEENT normocephalic atraumatic Neck supple trachea midline Lungs clear to auscultation bilaterally normal work of breathing at rest  Heart S1S2 no rub Abdomen soft nontender nondistended Extremities no edema  Psych no anxiety or agitation  Neuro more awake today.  Oriented to person and location and to year (after a pause on the year).  Her hand is resting on her plate in her eggs during our interview and she declined multiple offers of assistance to move it Access LUE AVF with bruit and thrill        OP HD: [place] TTS 4h   400/600  53.5kg  2/2 bath  Hep none LUA AVF - last HD 10/10 post wt 54.1kg - last Hb 10.1 - mircera 100 q2, new start  - iron sucrose 50 mg weekly - doxercalciferol 3 ug iv tts      109/100, HR 90,  RR 15  97%    K+ 5.4  BUN 42  cr 7.53   Problem/Plan Acute metabolic encephalopathy -multiple etiology MRSA bacteremia with vertebral discitis/osteomyelitis, dementia, gabapentin.   Had not really missed any HD sessions, No sig azotemia.  her baseline is with some cognitive deficits and sundowning  -  is worse in this setting and we will be unable to dialyze in the OP setting in this condition if no improvement.  Sounds like treatment ended early 30 minutes on 10/23 and she moved her arm quite a bit - but did agree to come to HD  2.   ESRD - on HD TTS.  Usually good compliance.  Missed last Thursday, but had  Friday and Saturday to get back on schedule.  Here initially treatments complicated by Afib and hypotension and intolerance of UF;  now more behavioral issues. As above would not be able to do dialysis in the OP setting in this state if she did not improve.   Things appear to be a little better - HD today to get back on TTS schedule and to optimize mental status  - I have spoken with her son and he shares she has always talked about stopping HD "ever since starting" but she has kept at it because she knows she needs it to stay alive. Continue goals of care discussions  - would obtain palliative consult if they have not been consulted  - today's labs pending   HTN /  vol - optimize volume with HD. Missed some meds yesterday.  Start losartan 25 mg daily to optimize Progression of discitis/osteomyelitis L-spine on MRI on 09/17/2022/= ID consulting noted [facility] 10/13 1 of 4 bottles GPC, MRSA per [facility] ID on vancomycin, TTE ordered by ID and neurosurgery to eval repeat blood cultures 10/15 ordered/NS consulted noted no surgery needed currently-  will need vanc 500 mg per HD until 12/11 per ID Rapid A-fib IV Cardizem drip started by admit team 10/13 evening.  Per primary team  Anemia of esrd - On ESA weekly which is now at 200 mcg every tuesday. Fe IV weekly held in setting of infection MBD ckd - CCa elevated, holding IV vdra. Cont renvela as binder.    Disposition - needs to be able to tolerate HD in order to be discharged (from a strictly renal standpoint). At this point having behavioral issues limiting dialysis  Labs: Basic Metabolic Panel: Recent Labs  Lab 09/25/22 0559 09/26/22 0343  09/27/22 0441  NA 134* 137 136  K 4.0 4.2 4.5  CL 95* 98 98  CO2 '27 25 26  '$ GLUCOSE 92 93 94  BUN 30* 34* 41*  CREATININE 6.39* 7.54* 8.76*  CALCIUM 10.4* 10.7* 10.3  PHOS 4.5 4.8* 5.4*   Liver Function Tests: Recent Labs  Lab 09/25/22 0559 09/26/22 0343 09/27/22 0441  AST  --   --  17  ALT  --   --  8  ALKPHOS  --   --  53  BILITOT  --   --  0.6  PROT  --   --  6.4*  ALBUMIN 2.5* 2.9* 2.6*  2.7*   No results for input(s): "LIPASE", "AMYLASE" in the last 168 hours. No results for input(s): "AMMONIA" in the last 168 hours.  CBC: Recent Labs  Lab 09/24/22 0344 09/25/22 0559 09/26/22 0343 09/27/22 0441 09/28/22 0649  WBC 3.6* 3.4* 5.2 3.9* 4.0  NEUTROABS 1.9 1.6* 3.1 2.2 2.3  HGB 9.2* 8.5* 10.2* 9.7* 10.6*  HCT 27.0* 24.5* 29.1* 28.1* 32.0*  MCV 98.2 98.0 97.7 98.3 101.3*  PLT 225 218 268 263 239   Cardiac Enzymes: No results for input(s): "CKTOTAL", "CKMB", "CKMBINDEX", "TROPONINI" in the last 168 hours. CBG: No results for input(s): "GLUCAP" in the last 168 hours.     ceFAZolin (ANCEF) IV 1 g (09/27/22 2143)    apixaban  2.5 mg Oral BID   Chlorhexidine Gluconate Cloth  6 each Topical Q0600   darbepoetin (ARANESP) injection - DIALYSIS  200 mcg Intravenous Q Tue-HD   diltiazem  120 mg Oral Daily   divalproex  125 mg Oral Q12H   feeding supplement  1 Container Oral TID WC   hydrALAZINE  100 mg Oral 2 times per day on Tue Thu Sat   hydrALAZINE  100 mg Oral 3 times per day on Sun Mon Wed Fri   hydrocortisone cream   Topical TID   metoprolol tartrate  50 mg Oral BID   mometasone-formoterol  2 puff Inhalation BID   multivitamin  1 tablet Oral QHS   pantoprazole  40 mg Oral BID   sevelamer carbonate  1,600 mg Oral TID WC   vancomycin variable dose per unstable renal function (pharmacist dosing)   Does not apply See admin instructions     Claudia Desanctis, MD 09/28/2022 8:25 AM

## 2022-09-28 NOTE — Progress Notes (Signed)
Pt arrived to unit in bed no c/os no distress noted stable for HD tx

## 2022-09-28 NOTE — Progress Notes (Signed)
Physical Therapy Treatment Patient Details Name: Diamond [name] MRN: [id number] DOB: September 10, 1951 Today's Date: 09/28/2022   History of Present Illness The pt is a 71 yo female presenting 10/12 with AMS after missing x2 HD treatments and sustaining a mechanical fall at home. Pt found to have HTN emergency, and MRI showed progression of discitis/osteomyelitis at L2-3 and L3-4 with stable compression fx of L2, L3, L4, and L5. Neurosurgery recommends medical management, non-op. PMH includes: anemia, asthma, atrial flutter, ESRD on HD, gout, and HTN.    PT Comments    Patient progressing very slowly towards PT goals. Session focused on bed mobility and sitting balance. Pt fixated on wanting to eat breakfast but slumped over in bed and unable to see/reach food. Requires Max A for bed mobility and Mod-Max A for sitting balance. Pt lethargic and has difficulty attending to task today despite max cues and stimulus. Difficulty feeding self as well. Continues to be appropriate for SNF. Will follow.   Recommendations for follow up therapy are one component of a multi-disciplinary discharge planning process, led by the attending physician.  Recommendations may be updated based on patient status, additional functional criteria and insurance authorization.  Follow Up Recommendations  Skilled nursing-short term rehab (<3 hours/day) Can patient physically be transported by private vehicle: No   Assistance Recommended at Discharge Frequent or constant Supervision/Assistance  Patient can return home with the following Two people to help with walking and/or transfers;Two people to help with bathing/dressing/bathroom;Assistance with cooking/housework;Assistance with feeding;Direct supervision/assist for medications management;Direct supervision/assist for financial management;Assist for transportation;Help with stairs or ramp for entrance   Equipment Recommendations  None recommended by PT    Recommendations for  Other Services       Precautions / Restrictions Precautions Precautions: Fall Required Braces or Orthoses: Spinal Brace Spinal Brace: Lumbar corset Other Brace: Spinal precautions for comfort Restrictions Weight Bearing Restrictions: No     Mobility  Bed Mobility Overal bed mobility: Needs Assistance Bed Mobility: Rolling, Sidelying to Sit, Sit to Supine Rolling: Max assist Sidelying to sit: Max assist, +2 for physical assistance, HOB elevated   Sit to supine: Max assist, +2 for physical assistance   General bed mobility comments: Pt not always understanding goal of sitting on EOB to eat and what goal was when lying back down therefore tended to to resist movements that therapists were assisting with. Assist needed with LEs, trunk and scooting bottom to get to EOB. Little assist from patient    Transfers                   General transfer comment: Deferred due to poor sitting balance, wanting to try to eat and worked on sitting balance/tolerance.    Ambulation/Gait                   Stairs             Wheelchair Mobility    Modified Rankin (Stroke Patients Only)       Balance Overall balance assessment: Needs assistance Sitting-balance support: Bilateral upper extremity supported, Feet supported Sitting balance-Leahy Scale: Poor Sitting balance - Comments: Pt with R lean onto elbow throughout session. Favors right lateral lean and needs constant assist for balance, worked on upright posture and pec stretching and offloading RUE to use it to eat Postural control: Right lateral lean (anterior lean)  Cognition Arousal/Alertness: Lethargic Behavior During Therapy: Flat affect Overall Cognitive Status: Difficult to assess Area of Impairment: Attention, Memory, Following commands, Awareness, Problem solving                   Current Attention Level: Sustained Memory: Decreased recall of  precautions, Decreased short-term memory Following Commands: Follows one step commands inconsistently Safety/Judgement: Decreased awareness of safety, Decreased awareness of deficits Awareness: Intellectual Problem Solving: Slow processing, Requires verbal cues, Requires tactile cues, Decreased initiation, Difficulty sequencing General Comments: Pt had eyes open for part of session but then would close them while sitting on EOB eating. Difficult to keep patient engaged for more than a few seconds at a time despite having food as motivator. "you are going too fast." Difficulty attending.        Exercises      General Comments General comments (skin integrity, edema, etc.): Pt lethargic today. pt did converse appropriately at times but at other times would doze off and not answer.      Pertinent Vitals/Pain Pain Assessment Pain Assessment: Faces Faces Pain Scale: Hurts little more Pain Location: low back Pain Descriptors / Indicators: Grimacing, Guarding Pain Intervention(s): Monitored during session, Limited activity within patient's tolerance, Repositioned, Patient requesting pain meds-RN notified    Home Living                          Prior Function            PT Goals (current goals can now be found in the care plan section) Progress towards PT goals: Progressing toward goals (slowly)    Frequency    Min 2X/week      PT Plan Current plan remains appropriate    Co-evaluation PT/OT/SLP Co-Evaluation/Treatment: Yes Reason for Co-Treatment: Complexity of the patient's impairments (multi-system involvement);To address functional/ADL transfers PT goals addressed during session: Mobility/safety with mobility;Balance OT goals addressed during session: ADL's and self-care      AM-PAC PT "6 Clicks" Mobility   Outcome Measure  Help needed turning from your back to your side while in a flat bed without using bedrails?: Total Help needed moving from lying on  your back to sitting on the side of a flat bed without using bedrails?: Total Help needed moving to and from a bed to a chair (including a wheelchair)?: Total Help needed standing up from a chair using your arms (e.g., wheelchair or bedside chair)?: Total Help needed to walk in hospital room?: Total Help needed climbing 3-5 steps with a railing? : Total 6 Click Score: 6    End of Session   Activity Tolerance: Patient limited by lethargy;Patient limited by fatigue Patient left: in bed;with call bell/phone within reach;with bed alarm set Nurse Communication: Mobility status;Need for lift equipment PT Visit Diagnosis: Unsteadiness on feet (R26.81);Other abnormalities of gait and mobility (R26.89);Muscle weakness (generalized) (M62.81);Pain Pain - part of body:  (back)     Time: 1696-7893 PT Time Calculation (min) (ACUTE ONLY): 26 min  Charges:  $Therapeutic Activity: 8-22 mins                     Marisa Severin, PT, DPT Acute Rehabilitation Services Secure chat preferred Office [place] 09/28/2022, 10:06 AM

## 2022-09-29 DIAGNOSIS — Z7189 Other specified counseling: Secondary | ICD-10-CM | POA: Diagnosis not present

## 2022-09-29 DIAGNOSIS — Z992 Dependence on renal dialysis: Secondary | ICD-10-CM | POA: Diagnosis not present

## 2022-09-29 DIAGNOSIS — G9341 Metabolic encephalopathy: Secondary | ICD-10-CM | POA: Diagnosis not present

## 2022-09-29 DIAGNOSIS — N186 End stage renal disease: Secondary | ICD-10-CM | POA: Diagnosis not present

## 2022-09-29 LAB — RENAL FUNCTION PANEL
Albumin: 2.7 g/dL — ABNORMAL LOW (ref 3.5–5.0)
Anion gap: 13 (ref 5–15)
BUN: 14 mg/dL (ref 8–23)
CO2: 29 mmol/L (ref 22–32)
Calcium: 10.2 mg/dL (ref 8.9–10.3)
Chloride: 94 mmol/L — ABNORMAL LOW (ref 98–111)
Creatinine, Ser: 4.58 mg/dL — ABNORMAL HIGH (ref 0.44–1.00)
GFR, Estimated: 10 mL/min — ABNORMAL LOW (ref >60)
Glucose, Bld: 91 mg/dL (ref 70–99)
Phosphorus: 3.5 mg/dL (ref 2.5–4.6)
Potassium: 3.7 mmol/L (ref 3.5–5.1)
Sodium: 136 mmol/L (ref 135–145)

## 2022-09-29 LAB — CBC WITH DIFFERENTIAL/PLATELET
Abs Immature Granulocytes: 0.02 10*3/uL (ref 0.00–0.07)
Basophils Absolute: 0 10*3/uL (ref 0.0–0.1)
Basophils Relative: 1 %
Eosinophils Absolute: 0.2 10*3/uL (ref 0.0–0.5)
Eosinophils Relative: 4 %
HCT: 27.6 % — ABNORMAL LOW (ref 36.0–46.0)
Hemoglobin: 9.5 g/dL — ABNORMAL LOW (ref 12.0–15.0)
Immature Granulocytes: 1 %
Lymphocytes Relative: 26 %
Lymphs Abs: 1.1 10*3/uL (ref 0.7–4.0)
MCH: 34.1 pg — ABNORMAL HIGH (ref 26.0–34.0)
MCHC: 34.4 g/dL (ref 30.0–36.0)
MCV: 98.9 fL (ref 80.0–100.0)
Monocytes Absolute: 0.5 10*3/uL (ref 0.1–1.0)
Monocytes Relative: 12 %
Neutro Abs: 2.5 10*3/uL (ref 1.7–7.7)
Neutrophils Relative %: 56 %
Platelets: 219 10*3/uL (ref 150–400)
RBC: 2.79 MIL/uL — ABNORMAL LOW (ref 3.87–5.11)
RDW: 18.1 % — ABNORMAL HIGH (ref 11.5–15.5)
WBC: 4.4 10*3/uL (ref 4.0–10.5)
nRBC: 0 % (ref 0.0–0.2)

## 2022-09-29 MED ORDER — HYDRALAZINE HCL 50 MG PO TABS
100.0000 mg | ORAL_TABLET | ORAL | Status: DC
  Administered 2022-09-30: 100 mg via ORAL
  Filled 2022-09-29 (×2): qty 2

## 2022-09-29 MED ORDER — DIVALPROEX SODIUM 125 MG PO DR TAB
125.0000 mg | DELAYED_RELEASE_TABLET | Freq: Every day | ORAL | Status: DC
  Administered 2022-09-29: 125 mg via ORAL
  Filled 2022-09-29: qty 1

## 2022-09-29 MED ORDER — CHLORHEXIDINE GLUCONATE CLOTH 2 % EX PADS: 6.0000 | MEDICATED_PAD | Freq: Every day | CUTANEOUS | Status: DC

## 2022-09-29 MED ORDER — DIVALPROEX SODIUM 125 MG PO DR TAB: 125.0000 mg | DELAYED_RELEASE_TABLET | Freq: Every day | ORAL | Status: DC

## 2022-09-29 NOTE — Consult Note (Signed)
   [facility] CM Inpatient Consult   09/29/2022  Kamisha B Wente 04/08/1951 [id number]   [facility] [[facility]]  Accountable Care Organization [ACO] Patient: [name]  Primary Care Provider: Osei-Bonsu, George, MD, [facility]   Patient was assessed for length of stay and for  [facility] [[facility]] Care Management for community services. Patient was previously active with [facility] Care Management.  Rounding and chart review reveals patient is awaiting possible skilled nursing facility level of care.  She was in patient care, asking questions of staff noted.  Reviewed MD progress notes, PT/OT Palliative consult, Psychiatry consult pending.  If patient transitions to a skilled nursing facility her immediate post hospital TOC needs are to be met at that level of care.  Of note, [facility] Care Management services does not replace or interfere with any services that are arranged by inpatient TOC care management team.   For additional questions or referrals please contact:   , RN BSN CCM [facility] Hospital Liaison Population Health  [phone] business mobile phone Toll free office [phone]  *Concierge Line  [phone] Fax number: [phone] [email or web address]    

## 2022-09-29 NOTE — Patient Outreach (Unsigned)
Diamond [name] [birth year] [id number]  Reviewed this area in [place] record to change from active status to not active, unable to do so.  Patient was no longer in active status with [facility] Care Management since 07/28/22

## 2022-09-29 NOTE — Progress Notes (Signed)
Nephrology Progress Note:  Subjective:   She had last HD on 10/24 with 0.1 kg UF.  The patient terminated her treatment early (appears had 1.1 hrs of tx of planned 3.5 hour treatment per charting). She states HD going ok and she doesn't recall stopping treatment early.  She denies any dizziness or cramping with HD.  She states that she does want to continue HD.    Review of systems:    Denies shortness of breath or chest pain  Denies n/v   Objective Vital signs in last 24 hours: Vitals:   09/28/22 2031 09/28/22 2130 09/28/22 2345 09/29/22 0405  BP: 138/85  128/82 (!) 148/92  Pulse: 65 71 72 74  Resp: '13  16 18  '$ Temp: 98.5 F (36.9 C)  98.7 F (37.1 C) 98.2 F (36.8 C)  TempSrc: Oral  Oral Oral  SpO2: 100%  100% 100%  Weight:      Height:        Physical Exam:     General elderly female in bed  HEENT normocephalic atraumatic Neck supple trachea midline Lungs clear to auscultation bilaterally normal work of breathing at rest  Heart S1S2 no rub Abdomen soft nontender nondistended Extremities no edema  Psych no anxiety or agitation  Neuro more awake today.  Oriented to person and location and to year (after a pause on the year).   Access LUE AVF with bruit and thrill        OP HD: [place] TTS 4h   400/600  53.5kg  2/2 bath  Hep none LUA AVF - last HD 10/10 post wt 54.1kg - last Hb 10.1 - mircera 100 q2, new start  - iron sucrose 50 mg weekly - doxercalciferol 3 ug iv tts      109/100, HR 90,  RR 15  97%    K+ 5.4  BUN 42  cr 7.53   Problem/Plan Acute metabolic encephalopathy -multiple etiology MRSA bacteremia with vertebral discitis/osteomyelitis, dementia, gabapentin.   Had not really missed any HD sessions, No sig azotemia.  her baseline is with some cognitive deficits and sundowning -  is worse in this setting.  Has improved but still ending treatments early.    2.   ESRD - on HD TTS.  Usually good compliance.  Here initially treatments complicated by Afib and  hypotension and intolerance of UF;  now more behavioral issues. As above would not be able to do dialysis in the OP setting in this state if she did not improve - HD per TTS schedule - first shift if possible.  She does still want to continue dialysis. - outpatient she will need vanc with HD as below - I have spoken with her son and he shares she has always talked about stopping HD "ever since starting" but she has kept at it because she knows she needs it to stay alive. Continue goals of care discussions  - appreciate palliative care   HTN /vol - optimize volume with HD. improved Progression of discitis/osteomyelitis L-spine on MRI on 09/17/2022/= ID consulting noted [facility] 10/13 1 of 4 bottles GPC, MRSA per [facility] ID on vancomycin, TTE ordered by ID and neurosurgery to eval repeat blood cultures 10/15 ordered/NS consulted noted no surgery needed currently-  will need vanc 500 mg per HD until 12/11 per ID Afib.  Note hx of rapid A-fib IV Cardizem drip started by admit team 10/13 evening.  Per primary team  Anemia of esrd - On ESA weekly which is now  at 200 mcg every tuesday. Fe IV weekly held in setting of infection MBD ckd - CCa elevated, holding IV vdra. Cont renvela as binder.    Disposition - per primary team   Labs: Basic Metabolic Panel: Recent Labs  Lab 09/27/22 0441 09/28/22 0650 09/29/22 0546  NA 136 138 136  K 4.5 3.9 3.7  CL 98 96* 94*  CO2 '26 29 29  '$ GLUCOSE 94 91 91  BUN 41* 14 14  CREATININE 8.76* 4.50* 4.58*  CALCIUM 10.3 10.1 10.2  PHOS 5.4* 3.2 3.5   Liver Function Tests: Recent Labs  Lab 09/27/22 0441 09/28/22 0650 09/29/22 0546  AST 17  --   --   ALT 8  --   --   ALKPHOS 53  --   --   BILITOT 0.6  --   --   PROT 6.4*  --   --   ALBUMIN 2.6*  2.7* 2.8* 2.7*   No results for input(s): "LIPASE", "AMYLASE" in the last 168 hours. No results for input(s): "AMMONIA" in the last 168 hours.  CBC: Recent Labs  Lab 09/25/22 0559 09/26/22 0343 09/27/22 0441  09/28/22 0649 09/29/22 0546  WBC 3.4* 5.2 3.9* 4.0 4.4  NEUTROABS 1.6* 3.1 2.2 2.3 2.5  HGB 8.5* 10.2* 9.7* 10.6* 9.5*  HCT 24.5* 29.1* 28.1* 32.0* 27.6*  MCV 98.0 97.7 98.3 101.3* 98.9  PLT 218 268 263 239 219   Cardiac Enzymes: No results for input(s): "CKTOTAL", "CKMB", "CKMBINDEX", "TROPONINI" in the last 168 hours. CBG: No results for input(s): "GLUCAP" in the last 168 hours.     ceFAZolin (ANCEF) IV 1 g (09/28/22 1831)   vancomycin 500 mg (09/28/22 1906)    apixaban  2.5 mg Oral BID   Chlorhexidine Gluconate Cloth  6 each Topical Q0600   darbepoetin (ARANESP) injection - DIALYSIS  200 mcg Intravenous Q Tue-HD   diltiazem  120 mg Oral Daily   divalproex  125 mg Oral Q12H   feeding supplement  1 Container Oral TID WC   hydrALAZINE  100 mg Oral 2 times per day on Tue Thu Sat   hydrALAZINE  100 mg Oral 3 times per day on Sun Mon Wed Fri   hydrocortisone cream   Topical TID   losartan  25 mg Oral Daily   metoprolol tartrate  50 mg Oral BID   mometasone-formoterol  2 puff Inhalation BID   multivitamin  1 tablet Oral QHS   pantoprazole  40 mg Oral BID   sevelamer carbonate  1,600 mg Oral TID WC     Claudia Desanctis, MD 09/29/2022 8:09 AM

## 2022-09-29 NOTE — Progress Notes (Signed)
Daily Progress Note   Patient Name: Diamond [name]       Date: 09/29/2022 DOB: 26-Apr-1951  Age: 71 y.o. MRN#: [id number] Attending Physician: Edwin Dada, * Primary Care Physician: [name], MD Admit Date: 09/16/2022  Reason for Consultation/Follow-up: Establishing goals of care  Patient Profile/HPI:   71 y.o. female  with past medical history of ESRD on HD, gall bladder mass with radical cholecystectomy this past July, PAF, HTN, vertebral discitis admitted on 09/16/2022 with fall at home, confusion. Workup revealed recurrent MRSA bacteremia requiring IV antibiotics until December. Admission has been complicated by mental status impeding HD. Seen by psych and started on Depakote. Palliative medicine consulted for above.  Subjective: Chart reviewed. Noted discussion held with patient's son, Diamond [name] and attending MD.  Diamond [name] is awake and alert. Tells me she spoke with her doctor about "old people's diseases". She shares that she gets disappointed when her memory fails her. She's "not ready for that". No complaints of pain. Attempted to call her son, Diamond [name] for followup- left message.  Son Diamond [name] was at bedside and did not have any questions.   Physical Exam Vitals and nursing note reviewed.  Constitutional:      Comments: frail  Cardiovascular:     Rate and Rhythm: Normal rate.  Pulmonary:     Effort: Pulmonary effort is normal.  Neurological:     Mental Status: She is alert.             Vital Signs: BP (!) 138/92 (BP Location: Right Arm)   Pulse 65   Temp 98.6 F (37 C) (Oral)   Resp 18   Ht '5\' 7"'$  (1.702 m) Comment: per chart review  Wt 52 kg   SpO2 100%   BMI 17.96 kg/m  SpO2: SpO2: 100 % O2 Device: O2 Device: Room Air O2 Flow Rate: O2 Flow Rate (L/min):  2 L/min  Intake/output summary:  Intake/Output Summary (Last 24 hours) at 09/29/2022 1648 Last data filed at 09/28/2022 2100 Gross per 24 hour  Intake 200 ml  Output 100 ml  Net 100 ml   LBM: Last BM Date : 09/27/22 Baseline Weight: Weight: 50 kg Most recent weight: Weight: 52 kg       Palliative Assessment/Data: PPS: 40%      Patient Active Problem List   Diagnosis  Date Noted   PAF (paroxysmal atrial fibrillation) ([place])    Thyroid nodule 09/17/2022   Empty sella ([place]) 09/17/2022   Discitis of lumbosacral region 07/26/2022   Hypertensive urgency 07/21/2022   Bacteremia 06/30/2022   Malnutrition of moderate degree 06/22/2022   Physical deconditioning 06/18/2022   Pressure injury of skin 06/16/2022   MRSA bacteremia 06/13/2022   ESRD on dialysis [facility])    Paroxysmal atrial flutter (HCC)    Acute metabolic encephalopathy    Acute on chronic anemia with positive fecal occult  02/05/2022   Multiple polyps of sigmoid colon    Heme positive stool    Duodenitis    Candida esophagitis (HCC)    Gallbladder mass    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  01/16/2022   Septic discitis of lumbar region 01/16/2022   Hepatitis C without hepatic coma 01/15/2022   Sepsis due to methicillin resistant Staphylococcus aureus ([place]) 12/31/2021   Atrial flutter ([place]) 12/25/2021   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 seen on TEE on 1/17 12/24/2021   Lung nodule seen on imaging study 12/23/2021   Vertebral osteomyelitis ([place])    Sepsis due to methicillin susceptible Staphylococcus aureus ([place]) 12/17/2021   Lower back pain 12/17/2021   ESRD on hemodialysis ([place]) 12/17/2021   Encounter for screening for other viral diseases 11/03/2021   Allergy, unspecified, initial encounter 09/14/2021   Coagulation defect, unspecified ([place]) 22/48/2500   Complication of vascular dialysis catheter 09/14/2021   Gout due to renal impairment, right ankle and foot  09/14/2021   Iron deficiency anemia, unspecified 09/14/2021   Pain, unspecified 09/14/2021   Pruritus, unspecified 09/14/2021   Secondary hyperparathyroidism of renal origin ([place]) 09/14/2021   Shortness of breath 09/14/2021   Unspecified protein-calorie malnutrition ([place]) 09/14/2021   Vitamin D deficiency 01/02/2021   Hyperparathyroidism ([place]) 12/31/2020   CKD (chronic kidney disease), stage III ([place]) 11/10/2019   Cardiomegaly 11/05/2019   Hyperkalemia 11/05/2019   Hypokalemia 10/18/2019   Pneumonia due to COVID-19 virus 10/17/2019   Hypertension    Asthma    Gout    Hyponatremia    Multifactorial anemia-acute blood loss from bleeding HD catheter site-superimposed on anemia related to ESRD.     Palliative Care Assessment & Plan    Assessment/Recommendations/Plan  Continue full scope, full code, continue dialysis Noted plan for HD in chair tomorrow Left message for patient's son request return call PMT will follow peripherally, please contact if any acute needs arise   Code Status: Full code  Prognosis:  Unable to determine  Discharge Planning: [facility] for rehab with Palliative care service follow-up  Care plan was discussed with patient and care team.   Thank you for allowing the Palliative Medicine Team to assist in the care of this patient.   Greater than 50%  of this time was spent counseling and coordinating care related to the above assessment and plan.  Mariana Kaufman, AGNP-C Palliative Medicine   Please contact Palliative Medicine Team phone at [phone] for questions and concerns.

## 2022-09-29 NOTE — IPAL (Signed)
  Interdisciplinary Goals of Care Family Meeting   Date carried out: 09/29/2022  Location of the meeting: Bedside  Member's involved: Physician and Family Member or next of kin  Durable Power of Attorney or Loss adjuster, chartered: Patient    Discussion: We discussed goals of care for [employer] .  We discussed the course of illness so far, the natural history of MRSA bacteremia and lumbar discitis with vertebral osteomyelitis and ventral epidural abscess as well as delirium, including the possibility of recovery of normal cognitive function, and the possibility of poor recovery.  Given the patient's intolerance of dialysis in the last week despite improvement of other illness parameters, it is my medical opinion that a poor cognitive outcome is increasingly likely.  I shared with son that my impression was her prognosis was worsening.  We were able to confirm full code status and able to confirm the desire to continue dialysis.  We agreed to monitor ability to sit up today and tolerate seated dialysis tomorrow, and to review goals after tomorrow's dialysis session.    Code status: Full Code  Disposition: Continue current acute care  Time spent for the meeting: 6 minutes    Edwin Dada, MD  09/29/2022, 1:53 PM

## 2022-09-29 NOTE — Progress Notes (Signed)
Contacted by CSW that pt may be ready to d/c in the next few days to snf. Plan is for pt to receive HD in chair tomorrow (conversation between attending and nephrology this morning). If pt can tolerate HD in the chair tomorrow,then pt would be appropriate for out-pt HD at d/c. CSW advised navigator that pt will require use of a hoyer lift at clinic for pt to tx from w/c to HD chair. Contacted [facility] to make clinic aware of pt's possible in the next few days and that pt will require hoyer lift for transfers. Will assist as needed.   Melven Sartorius Renal Navigator [phone]

## 2022-09-29 NOTE — Consult Note (Signed)
[facility] Followup Face-to-Face Psychiatric Evaluation   Service Date: September 29, 2022 LOS:  LOS: 12 days    Assessment  Diamond [name] is a 71 y.o. female admitted medically for 09/16/2022 10:11 PM following a fall. She carries no known psychiatric problems and has a past medical history of  ESRD on HD, PAF on eliquis, HTN, h/o recurrent MRSA bacteremia and vertebral discitis.Psychiatry was consulted for AMS/Delirium by Dr. Algis Liming.   Her current presentation of waxing and waning confusion is most consistent with delirium.  Psychotropic medication appears to only have been taking gabapentin 100 mg nightly.  Patient appears to be alert and oriented during assessment but then becomes confused in the middle of dialysis.  Delirium likely exacerbated by ESRD, being in the hospital, infection, hypertension.  Minimizing any central acting agents that may worsen delirium patient has been put on delirium precautions.  Initially, we recommended Depakote 125 mg twice daily for plan to decrease it to 125 mg nightly due to oversedation.  Can consider antipsychotics should Depakote be ineffective but redirection and family assistance with orientation should be priority.  In speaking with patient's son, patient has been experiencing worsening confusion that waxes and wanes since January of this year when she had been admitted for MRSA bacteremia.   Diagnoses:  Active Hospital problems: Principal Problem:   Acute metabolic encephalopathy Active Problems:   ESRD on hemodialysis ([place])   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    Paroxysmal atrial flutter (HCC)   Hypertensive urgency   Thyroid nodule   Empty sella (HCC)   PAF (paroxysmal atrial fibrillation) ([place])     Plan  ## Safety and Observation Level:  - Based on my clinical evaluation, I estimate the patient to be at low risk of self harm in the current setting   ##Delirium  ##Acute metabolic  encephalopathy Possible component of dementia -Decrease Depakote to 125 mg nightly -Delirium precautions -Encourage family to assist with patient reorientation -Agree with palliative note that patient will most benefit from earlier dialysis -Could consider remeron if patient's lack of appetite and poor sleep are in setting of depression rather than just delirium  ## Medical Decision Making Capacity:  Not formally assessed  ## Further Work-up:  -- Per primary team  -- most recent EKG on 09/27/2019 had QtC of 462 -- Pertinent labwork reviewed earlier this admission includes: normal LFT, B12 442, nonreactive RPR  ## Disposition:  -- SNF when medically stable  ## Behavioral / Environmental:  -- delirium precuations  ##Legal Status Not under IVC  Thank you for this consult request. Recommendations have been communicated to the primary team.  We will continue to follow at this time.   France Ravens, MD   Relevant Aspects of Hospital Course:  Admitted on 09/16/2022 for a fall at home as well as intermittent confusion in the setting of missed dialysis  Patient Report:  Patient seen and assessed at bedside this morning.  Patient denies SI/HI/AVH.  Patient reports that she continues to experience some confusion especially in the evening later also reports occasionally experiencing this in the morning.  Patient does report memory difficulties at times as well as some appetite loss.  Reports confusion has been ongoing since January of this year.  Patient is alert and oriented to self, time, place, context patient is still willing to do dialysis.  ROS:  Denies SI/HI/AVH   Psychiatric History:  No Psychiatric hx of note    Family History:  The patient's family history includes Colon cancer in her brother; Hypertension in her father and mother; Lung cancer in her brother.  Medical History: Past Medical History:  Diagnosis Date   Anemia    Asthma    Atrial flutter with rapid  ventricular response ([place]) 12/25/2021   Chronic kidney disease    dialysis Tues Thurs Sat   Gout    Hypertension     Surgical History: Past Surgical History:  Procedure Laterality Date   AV FISTULA PLACEMENT Left 12/23/2021   Procedure: LEFT ARM BRACHIOBASILIC VEIN ARTERIOVENOUS (AV) FISTULA CREATION;  Surgeon: [name], MD;  Location: [facility];  Service: Vascular;  Laterality: Left;   [place] Left 03/08/2022   Procedure: LEFT SECOND STAGE BASILIC VEIN TRANSPOSITION;  Surgeon: [name], MD;  Location[facility];  Service: Vascular;  Laterality: Left;   BIOPSY  01/19/2022   Procedure: BIOPSY;  Surgeon: Lavena Bullion, DO;  Location: [place] ENDOSCOPY;  Service: Gastroenterology;;   [place] N/A 06/25/2022   Procedure: OPEN CHOLECYSTECTOMY;  Surgeon: Dwan Bolt, MD;  Location: [place];  Service: General;  Laterality: N/A;   COLONOSCOPY Left 01/19/2022   Procedure: COLONOSCOPY;  Surgeon: Lavena Bullion, DO;  Location: [place];  Service: Gastroenterology;  Laterality: Left;   ESOPHAGOGASTRODUODENOSCOPY Left 01/19/2022   Procedure: ESOPHAGOGASTRODUODENOSCOPY (EGD);  Surgeon: Lavena Bullion, DO;  Location[facility] ENDOSCOPY;  Service: Gastroenterology;  Laterality: Left;   INSERTION OF DIALYSIS CATHETER Right 12/23/2021   Procedure: INSERTION OF DIALYSIS CATHETER;  Surgeon: [name], MD;  Location: [place];  Service: Vascular;  Laterality: Right;   LAPAROSCOPY N/A 06/25/2022   Procedure: STAGING LAPAROSCOPY;  Surgeon: Dwan Bolt, MD;  Location: [place];  Service: General;  Laterality: N/A;   OPEN PARTIAL HEPATECTOMY  N/A 06/25/2022   Procedure: OPEN PARTIAL HEPATECTOMY;  Surgeon: Dwan Bolt, MD;  Location: [place];  Service: General;  Laterality: N/A;   POLYPECTOMY  01/19/2022   Procedure: POLYPECTOMY;  Surgeon: Lavena Bullion, DO;  Location: [place] ENDOSCOPY;  Service: Gastroenterology;;   TEE WITHOUT CARDIOVERSION N/A  12/22/2021   Procedure: TRANSESOPHAGEAL ECHOCARDIOGRAM (TEE);  Surgeon: Jerline Pain, MD;  Location[facility] END[facility];  Service: Cardiovascular;  Laterality: N/A;   TEE WITHOUT CARDIOVERSION N/A 06/17/2022   Procedure: TRANSESOPHAGEAL ECHOCARDIOGRAM (TEE);  Surgeon: Buford Dresser, MD;  Location: [facility] ENDOSCOPY;  Service: Cardiovascular;  Laterality: N/A;   ULTRASOUND GUIDANCE FOR VASCULAR ACCESS  12/23/2021   Procedure: ULTRASOUND GUIDANCE FOR VASCULAR ACCESS;  Surgeon: [name], MD;  Location: [facility] OR;  Service: Vascular;;    Medications:   Current Facility-Administered Medications:    acetaminophen (TYLENOL) tablet 650 mg, 650 mg, Oral, Q6H PRN, 650 mg at 09/25/22 2221 **OR** acetaminophen (TYLENOL) suppository 650 mg, 650 mg, Rectal, Q6H PRN, Alcario Drought, Jared M, DO   albuterol (PROVENTIL) (2.5 MG/3ML) 0.083% nebulizer solution 2.5 mg, 2.5 mg, Inhalation, Q4H PRN, Alcario Drought, Jared M, DO   apixaban Arne [place]) tablet 2.5 mg, 2.5 mg, Oral, BID, Alcario Drought, Jared M, DO, 2.5 mg at 09/29/22 1011   camphor-menthol (SARNA) lotion, , Topical, PRN, Alma Friendly, MD, Given at 09/29/22 1015   ceFAZolin (ANCEF) IVPB 1 g/50 mL premix, 1 g, Intravenous, Daily, Tobie Poet E, NP, Last Rate: 100 mL/hr at 09/28/22 1831, 1 g at 09/28/22 1831   Chlorhexidine Gluconate Cloth 2 % PADS 6 each, 6 each, Topical, Q0600, Corliss Parish, MD, 6 each at 09/29/22 [phone]  Darbepoetin Alfa (ARANESP) injection 200 mcg, 200 mcg, Intravenous, Q Tue-HD, Corliss Parish, MD, 200 mcg at 09/28/22 1711   diltiazem (CARDIZEM CD) 24 hr capsule 120 mg, 120 mg, Oral, Daily, Donato Heinz, MD, 120 mg at 09/29/22 1010   divalproex (DEPAKOTE) DR tablet 125 mg, 125 mg, Oral, QHS, France Ravens, MD   feeding supplement (BOOST / RESOURCE BREEZE) liquid 1 Container, 1 Container, Oral, TID WC, Hongalgi, Anand D, MD   hydrALAZINE (APRESOLINE) tablet 100 mg, 100 mg, Oral, 2 times per day on Tue Thu Sat, Gardner,  Jared M, DO, 100 mg at 09/28/22 2131   hydrALAZINE (APRESOLINE) tablet 100 mg, 100 mg, Oral, 3 times per day on Sun Mon Wed Fri, Gardner, Jared M, DO, 100 mg at 09/29/22 1014   hydrocortisone cream 1 %, , Topical, TID, Alma Friendly, MD, Given at 09/29/22 1016   labetalol (NORMODYNE) injection 10-20 mg, 10-20 mg, Intravenous, Q2H PRN, Etta Quill, DO, 10 mg at 09/28/22 0504   losartan (COZAAR) tablet 25 mg, 25 mg, Oral, Daily, Claudia Desanctis, MD, 25 mg at 09/29/22 1013   metoprolol tartrate (LOPRESSOR) tablet 50 mg, 50 mg, Oral, BID, Alcario Drought, Jared M, DO, 50 mg at 09/29/22 1011   mometasone-formoterol (DULERA) 200-5 MCG/ACT inhaler 2 puff, 2 puff, Inhalation, BID, Etta Quill, DO, 2 puff at 09/29/22 0910   multivitamin (RENA-VIT) tablet 1 tablet, 1 tablet, Oral, QHS, Hongalgi, Anand D, MD, 1 tablet at 09/28/22 2131   ondansetron (ZOFRAN) tablet 4 mg, 4 mg, Oral, Q6H PRN **OR** ondansetron (ZOFRAN) injection 4 mg, 4 mg, Intravenous, Q6H PRN, Etta Quill, DO   Oral care mouth rinse, 15 mL, Mouth Rinse, PRN, Alma Friendly, MD   pantoprazole (PROTONIX) EC tablet 40 mg, 40 mg, Oral, BID, Alcario Drought, Jared M, DO, 40 mg at 09/29/22 1013   sevelamer carbonate (RENVELA) tablet 1,600 mg, 1,600 mg, Oral, TID WC, Alcario Drought, Jared M, DO, 1,600 mg at 09/29/22 1242   vancomycin (VANCOREADY) IVPB 500 mg/100 mL, 500 mg, Intravenous, Q T,Th,Sa-HD, Alvira Philips, [facility], Last Rate: 100 mL/hr at 09/28/22 1906, 500 mg at 09/28/22 1906  Allergies: Allergies  Allergen Reactions   Shellfish Allergy Other (See Comments)    Unknown reaction    Other Rash    Pt states anesthesia makes her brake out in rash       Objective  Vital signs:  Temp:  [97.7 F (36.5 C)-98.7 F (37.1 C)] 98 F (36.7 C) (10/25 1158) Pulse Rate:  [63-74] 70 (10/25 1158) Resp:  [13-18] 18 (10/25 0405) BP: (86-148)/(62-112) 133/87 (10/25 1158) SpO2:  [97 %-100 %] 100 % (10/25 1158)  Psychiatric Specialty  Exam:  Presentation  General Appearance: Appropriate for Environment; Casual  Eye Contact:Good  Speech:Clear and Coherent; Normal Rate  Speech Volume:Normal  Handedness:Right   Mood and Affect  Mood:Euthymic  Affect:Appropriate; Congruent   Thought Process  Thought Processes:Coherent; Goal Directed; Linear  Descriptions of Associations:Intact  Orientation:Full (Time, Place and Person)  Thought Content:Logical  History of Schizophrenia/Schizoaffective disorder:No data recorded Duration of Psychotic Symptoms:No data recorded Hallucinations:Hallucinations: None  Ideas of Reference:None  Suicidal Thoughts:Suicidal Thoughts: No  Homicidal Thoughts:Homicidal Thoughts: No   Sensorium  Memory:Immediate Good; Recent Good; Remote Good  Judgment:Fair  Insight:Fair   Executive Functions  Concentration:Good  Attention Span:Good  [place] of Knowledge:Good  Language:Good   Psychomotor Activity  Psychomotor Activity:Psychomotor Activity: Normal   Assets  Assets:Communication Skills; Social Support; Housing   Sleep  Sleep:Sleep:  Fair    Physical Exam: Physical Exam ROS Blood pressure 133/87, pulse 70, temperature 98 F (36.7 C), temperature source Oral, resp. rate 18, height '5\' 7"'$  (1.702 m), weight 52 kg, SpO2 100 %. Body mass index is 17.96 kg/m.

## 2022-09-29 NOTE — TOC Progression Note (Signed)
Transition of Care [facility]) - Progression Note    Patient Details  Name: AUSET FRITZLER MRN: [id number] Date of Birth: [birth year]  Transition of Care [facility]) CM/SW [place], [place] Phone Number: 09/29/2022, 3:42 PM  Clinical Narrative:   CSW updated that patient's confusion is improving, if able to dialyze in a chair tomorrow then patient will be ready for SNF. CSW confirmed bed availability with [place], and initiated insurance authorization. CSW asked RN to have patient sit up in recliner today to prepare for chair dialysis tomorrow, and discussed with renal navigator need for probably lift at outpatient clinic. CSW to follow.    Expected Discharge Plan: [place] Barriers to Discharge: Continued Medical Work up, Ship broker  Expected Discharge Plan and Services Expected Discharge Plan: [place] Choice: [place] Living arrangements for the past 2 months: Single Family Home                                       Social Determinants of Health (SDOH) Interventions    Readmission Risk Interventions    06/30/2022    1:12 PM 06/17/2022    1:02 PM 01/22/2022   12:19 PM  Readmission Risk Prevention Plan  Transportation Screening Complete Complete Complete  Medication Review Press photographer) Complete Complete Complete  PCP or Specialist appointment within 3-5 days of discharge Complete Complete Complete  HRI or Home Care Consult Complete Complete Complete  SW Recovery Care/Counseling Consult Complete Complete Complete  Palliative Care Screening Not Applicable Not Applicable Not [place] Not Applicable Complete Not Applicable

## 2022-09-29 NOTE — Progress Notes (Signed)
Progress Note   Patient: Diamond [name] QPY:[id number] DOB: [birth year] DOA: 09/16/2022     12 DOS: the patient was seen and examined on 09/29/2022 at 8:57 AM and 1:15PM      Brief hospital course: Diamond [name] is a 71 year old F with ESRD on HD, PAF on Eliquis, HTN, and recurrent MRSA bacteremia and vertebral discitis who presented after a fall and back pain and confusion.  Please see longer summary by Dr. Oncology from 10/24     Assessment and Plan: *MRSA bacteremia and osteomyelitis/lumbar discitis, ventral epidural abscess See summary from 10/24 - Continue IV vancomycin post HD for 8 weeks - Follow-up with infectious disease   Acute metabolic encephalopathy This was delirium due to her infection, superimposed on probably some mild cognitive impairment.  Had long conversation with family today, regarding the implication of this confusion for her continued dialysis  In the last week, pateint has not shown steady improvement reports being able to complete dialysis in the chair setting, on a consistent basis.  She is poorly able to sit up for long periods of time.  Plan: -Stop Depakote, avoid other sedating medicines - Sit in chair today -Dialysis in chair tomorrow -If patient is able to tolerate chair for 4 hours today, and chair dialysis tomorrow, we will plan for discharge to SNF tomorrow evening or Friday -If patient unable to tolerate chair dialysis, we can try again on Saturday, but given her overall deconditioning, I fear that she is failing to thrive and may need to enter hospice   Possible AV fistula infection - Continue cefazolin for 7 days    Hypertensive urgency Resolved Please see summary from 10/24 Also patient's daughter today told me that patient had had a stroke in the hospital, but I see no such evidence, nor any findings to suggest this. - Continue losartan, metoprolol - Change hydralazine to 3 times daily on dialysis days and 2 times daily on  nondialysis days per daughter  ESRD on hemodialysis ([place]) 2 missed dialysis sessions per family report to EMS. Call nephrology in AM for HD.  Paroxysmal atrial fibrillation -Continue apixaban - Continue diltiazem  Empty sella Incidental finding, asymptomatic  Thyroid nodule S/p thyroid US - No follow-up needed       Subjective: Patient is extremely weak and tired, she is sleepy and forgetful, and very weak.     Physical Exam: BP 133/87 (BP Location: Right Arm)   Pulse 70   Temp 98 F (36.7 C) (Oral)   Resp 18   Ht '5\' 7"'$  (1.702 m) Comment: per chart review  Wt 52 kg   SpO2 100%   BMI 17.96 kg/m   Elderly adult female, lying in bed, appears weak and thin RRR, no murmurs, no peripheral edema Respiratory rate normal, lungs clear without rales or wheezes Thin Attention diminished, falls asleep occasionally during our conversation, face symmetric, speech fluent, very forgetful  Data Reviewed: Basic metabolic panel shows normal sodium, potassium Hemogram shows stable hemoglobin, normal white blood cells and platelets  Family Communication: Daughter by phone, son at the bedside    Disposition: Status is: Inpatient The patient presented with acute metabolic encephalopathy and worsening of her discitis.  She will need 8 weeks of IV vancomycin after dialysis.  Medically she is stabilized, but not currently able to tolerate dialysis in the outpatient setting  She will dialyze in the chair on Thursday, and if able to complete this, can be discharged to [facility] Thursday or Friday  If she is  unable to tolerate dialysis in the chair on Thursday, we can continue following Saturday, but as time progresses and she is unable to dialyze in the chair, it may be that she is not a long-term dialysis candidate anymore.  Palliative following        Author: Edwin Dada, MD 09/29/2022 1:58 PM  For on call review [email or web address].

## 2022-09-30 DIAGNOSIS — G9341 Metabolic encephalopathy: Secondary | ICD-10-CM | POA: Diagnosis not present

## 2022-09-30 LAB — CBC WITH DIFFERENTIAL/PLATELET
Abs Immature Granulocytes: 0.02 10*3/uL (ref 0.00–0.07)
Basophils Absolute: 0 10*3/uL (ref 0.0–0.1)
Basophils Relative: 0 %
Eosinophils Absolute: 0.2 10*3/uL (ref 0.0–0.5)
Eosinophils Relative: 3 %
HCT: 25.4 % — ABNORMAL LOW (ref 36.0–46.0)
Hemoglobin: 8.3 g/dL — ABNORMAL LOW (ref 12.0–15.0)
Immature Granulocytes: 0 %
Lymphocytes Relative: 23 %
Lymphs Abs: 1.3 10*3/uL (ref 0.7–4.0)
MCH: 33.1 pg (ref 26.0–34.0)
MCHC: 32.7 g/dL (ref 30.0–36.0)
MCV: 101.2 fL — ABNORMAL HIGH (ref 80.0–100.0)
Monocytes Absolute: 0.6 10*3/uL (ref 0.1–1.0)
Monocytes Relative: 11 %
Neutro Abs: 3.3 10*3/uL (ref 1.7–7.7)
Neutrophils Relative %: 63 %
Platelets: 216 10*3/uL (ref 150–400)
RBC: 2.51 MIL/uL — ABNORMAL LOW (ref 3.87–5.11)
RDW: 18.4 % — ABNORMAL HIGH (ref 11.5–15.5)
WBC: 5.4 10*3/uL (ref 4.0–10.5)
nRBC: 0 % (ref 0.0–0.2)

## 2022-09-30 LAB — RENAL FUNCTION PANEL
Albumin: 2.5 g/dL — ABNORMAL LOW (ref 3.5–5.0)
Anion gap: 12 (ref 5–15)
BUN: 21 mg/dL (ref 8–23)
CO2: 27 mmol/L (ref 22–32)
Calcium: 10.4 mg/dL — ABNORMAL HIGH (ref 8.9–10.3)
Chloride: 101 mmol/L (ref 98–111)
Creatinine, Ser: 6.55 mg/dL — ABNORMAL HIGH (ref 0.44–1.00)
GFR, Estimated: 6 mL/min — ABNORMAL LOW (ref >60)
Glucose, Bld: 101 mg/dL — ABNORMAL HIGH (ref 70–99)
Phosphorus: 3.9 mg/dL (ref 2.5–4.6)
Potassium: 3.7 mmol/L (ref 3.5–5.1)
Sodium: 140 mmol/L (ref 135–145)

## 2022-09-30 LAB — GLUCOSE, CAPILLARY: Glucose-Capillary: 103 mg/dL — ABNORMAL HIGH (ref 70–99)

## 2022-09-30 MED ORDER — DARBEPOETIN ALFA 200 MCG/0.4ML IJ SOSY
200.0000 ug | PREFILLED_SYRINGE | INTRAMUSCULAR | Status: DC
  Filled 2022-09-30: qty 0.4

## 2022-09-30 NOTE — Progress Notes (Signed)
Patient came back from dialysis a little confused and agitated. Refusing to take medications by mouth. Patient also came back with no IV in place. Charge RN attempted to place IV x1 however patient pulled back and pulled IV from arm prior to being able to stabilize. Will consult IV team.

## 2022-09-30 NOTE — Progress Notes (Signed)
PT Cancellation Note  Patient Details Name: Diamond [name] MRN: [id number] DOB: 12-Sep-1951   Cancelled Treatment:    Reason Eval/Treat Not Completed: Patient at procedure or test/unavailable  Patient upright in chair, about to be transported to dialysis. Will check back later today for PT as time allows.  Ellouise Newer 09/30/2022, 11:49 AM

## 2022-09-30 NOTE — TOC Progression Note (Signed)
Transition of Care [facility]) - Progression Note    Patient Details  Name: Diamond [name] MRN: [id number] Date of Birth: Sep 12, 1951  Transition of Care [facility]) CM/SW [place], [place] Phone Number: 09/30/2022, 2:02 PM  Clinical Narrative:    CSW notified pt will receive HD in the chair today, will continue to follow for results. Pt's authorization is still pending for [facility] at this time. TOC will continue to follow for DC needs.   Expected Discharge Plan: [place] Barriers to Discharge: Continued Medical Work up, Ship broker  Expected Discharge Plan and Services Expected Discharge Plan: [place] Choice: [place] Living arrangements for the past 2 months: Single Family Home                                       Social Determinants of Health (SDOH) Interventions    Readmission Risk Interventions    06/30/2022    1:12 PM 06/17/2022    1:02 PM 01/22/2022   12:19 PM  Readmission Risk Prevention Plan  Transportation Screening Complete Complete Complete  Medication Review Press photographer) Complete Complete Complete  PCP or Specialist appointment within 3-5 days of discharge Complete Complete Complete  HRI or Home Care Consult Complete Complete Complete  SW Recovery Care/Counseling Consult Complete Complete Complete  Palliative Care Screening Not Applicable Not Applicable Not [place] Not Applicable Complete Not Applicable

## 2022-09-30 NOTE — Hospital Course (Addendum)
58yof with ESRD on HD, PAF on Eliquis, HTN, and recurrent MRSA bacteremia and vertebral discitis  completed prolonged IV followed by oral antibiotics who presented after a fall and back pain and confusion. Secondary admitted found to have recurrent MRSA bacteremia with lumbar discitis/osteomyelitis, acute on chronic back pain.  Seen by ID, nephrology, psychiatry and palliative medicine. She will need 8 weeks of IV vancomycin, post HD.  She has had recurrent A-fib with RVR since 10/17, reverted to sinus rhythm.  She has had ongoing altered mental status seen by psychiatry- cont to avoid sedative meds/opioids etc,avoid QT prolonging meds.  Overall mentation has much improved alert awake oriented communicative interactive.  She wants to continue with dialysis.  Appears deconditioned weak PT OT working with her with good progress towards PT goal. 10/27 P2P completed and SNF approved.

## 2022-09-30 NOTE — Progress Notes (Signed)
Received patient in bed to unit.  Alert and oriented.  Informed consent signed and in chart.   Treatment initiated: 1232 Treatment completed: 1617  Patient tolerated well.  Transported back to the room  Alert, without acute distress.  Hand-off given to patient's nurse.   Access used: [place] Access issues: none  Total UF removed: 1300 Medication(s) given: Vanc Post HD VS: 98.3, 140/80(99), HR-72, RR-17, TX64-68 Post HD weight: unable to get.   [facility]

## 2022-09-30 NOTE — Progress Notes (Signed)
PROGRESS NOTE Diamond [name]  YPP:[id number] DOB: 28-Apr-1951 DOA: 09/16/2022 PCP: [name], MD   Brief Narrative/Hospital Course: 4yof with ESRD on HD, PAF on Eliquis, HTN, and recurrent MRSA bacteremia and vertebral discitis  completed prolonged IV followed by oral antibiotics who presented after a fall and back pain and confusion. Secondary admitted found to have recurrent MRSA bacteremia with lumbar discitis/osteomyelitis, acute on chronic back pain.  Seen by ID, nephrology, psychiatry and palliative medicine. She will need 8 weeks of IV vancomycin, post HD.  She has had recurrent A-fib with RVR since 10/17, reverted to sinus rhythm.  She has had ongoing altered mental status seen by psychiatry- cont to avoid sedative meds/opioids etc,avoid QT prolonging meds. psych started Depakote.  Once mentation improves, compliant with HD plan is for skilled nursing facility     Subjective: Seen and examined this morning, nursing reports was drowsy earlier.  Alert awake oriented to self current place current president and month year. Willing to go for dialysis today   Assessment and Plan: Principal Problem:   Acute metabolic encephalopathy Active Problems:   Hypertensive urgency   ESRD on hemodialysis (HCC)   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    Paroxysmal atrial flutter (HCC)   Thyroid nodule   Empty sella (HCC)   PAF (paroxysmal atrial fibrillation) (HCC)  MRSA bacteremia Osteomyelitis/lumbar discitis Ventral epidural abscess: See summary from 10/24.  Appreciate ID input plan is to continue IV vancomycin post HD x8 weeks and ID follow-up  Acute metabolic encephalopathy: Delirium due to her infection and superimposed on probably some mild cognitive impairment. Dr Loleta Books had long conversation with family [id number] the implication of this confusion for her continued dialysis. In the last week, pateint has not shown steady improvement reports being  able to complete dialysis in the chair setting, on a consistent basis.  She is poorly able to sit up for long periods of time. Much more alert awake interactive.  Depakote discontinued, continue PT OT supportive care minimize sedatives Dialysis in chair today: if patient is able to tolerate chair for 4 hours> plan for discharge to SNF tomorrow  or so. if patient unable to tolerate chair dialysis, we can try again on Saturday, but given her overall deconditioning,-the fear is that she may continue to fail, w/ failure to thrive and may eventually need hospice   Possible AV fistula infection Ancef x7 days  Hypertensive urgency: BP has stabilized.  On losartan metoprolol hydralazine tid on HD day and Bid on non HD day. Please see summary from 10/24   ESRD on hemodialysis: Nephro on board, she terminated her treatment early, plan is to dialyze in chair today and see how it goes.     PAF: On Eliquis and Cardizem.   Empty sella:Incidental finding, asymptomatic  Thyroid nodule:S/p thyroid US-No follow-up needed  Moderate malnutrition augment diet as below Nutrition Problem: Moderate Malnutrition Etiology: chronic illness (ESRD on HD) Signs/Symptoms: moderate muscle depletion, moderate fat depletion, percent weight loss (12% x 6 months) Percent weight loss: 12 % Interventions: Refer to RD note for recommendations    DVT prophylaxis: apixaban (ELIQUIS) tablet 2.5 mg Start: 09/17/22 0315 Code Status:   Code Status: Full Code Family Communication: plan of care discussed with patient at bedside. Patient status is: Inpatient because of dialysis needed, MRSA bacteremia Level of care: Med-Surg   DISPO: Dialysis in chair today: if patient is able to tolerate chair for 4 hours> plan for discharge to SNF tomorrow  or so.  if patient unable to tolerate chair dialysis, we can try again on Saturday, but given her overall deconditioning,-the fear is that she may continue to fail, w/ failure to thrive and may  eventually need hospice  Mobility Assessment (last 72 hours)     Mobility Assessment     Row Name 09/29/22 2030 09/29/22 0911 09/28/22 2042 09/28/22 1004 09/28/22 0900   Does patient have an order for bedrest or is patient medically unstable -- Yes- Bedfast (Level 1) - Complete -- -- --   What is the highest level of mobility based on the progressive mobility assessment? Level 2 (Chairfast) - Balance while sitting on edge of bed and cannot stand Level 2 (Chairfast) - Balance while sitting on edge of bed and cannot stand Level 1 (Bedfast) - Unable to balance while sitting on edge of bed Level 1 (Bedfast) - Unable to balance while sitting on edge of bed Level 1 (Bedfast) - Unable to balance while sitting on edge of bed   Is the above level different from baseline mobility prior to current illness? -- Yes - Recommend PT order -- -- --    Row Name 09/27/22 2000           Does patient have an order for bedrest or is patient medically unstable No - Continue assessment       What is the highest level of mobility based on the progressive mobility assessment? Level 1 (Bedfast) - Unable to balance while sitting on edge of bed                 Objective: Vitals last 24 hrs: Vitals:   09/29/22 2128 09/29/22 2321 09/30/22 0410 09/30/22 0732  BP: 107/62 (!) 99/46 (!) 151/84 (!) 146/81  Pulse: 69 74 74 70  Resp: '18 14 16 19  '$ Temp: 98 F (36.7 C) 98.4 F (36.9 C) 98.4 F (36.9 C) 98.3 F (36.8 C)  TempSrc: Oral Oral Oral Oral  SpO2: 99% 96% 96% 100%  Weight:      Height:       Weight change:   Physical Examination: General exam: alert awake, appears older than stated age HEENT:Oral mucosa moist, Ear/Nose WNL grossly Respiratory system: bilaterally clear BS, no use of accessory muscle Cardiovascular system: S1 & S2 +, No JVD. Gastrointestinal system: Abdomen soft,NT,ND, BS+ Nervous System:Alert, awake, moving all her extremities. Extremities: LE edema neg,distal peripheral pulses  palpable.  Skin: No rashes,no icterus. MSK: Normal muscle bulk,tone, power. LUE w/ AV fistula  Medications reviewed:  Scheduled Meds:  apixaban  2.5 mg Oral BID   Chlorhexidine Gluconate Cloth  6 each Topical Q0600   darbepoetin (ARANESP) injection - DIALYSIS  200 mcg Intravenous Q Tue-HD   diltiazem  120 mg Oral Daily   feeding supplement  1 Container Oral TID WC   hydrALAZINE  100 mg Oral 3 times per day on Sun Mon Wed Fri   hydrALAZINE  100 mg Oral 2 times per day on Tue Thu Sat   hydrocortisone cream   Topical TID   losartan  25 mg Oral Daily   metoprolol tartrate  50 mg Oral BID   mometasone-formoterol  2 puff Inhalation BID   multivitamin  1 tablet Oral QHS   pantoprazole  40 mg Oral BID   sevelamer carbonate  1,600 mg Oral TID WC   Continuous Infusions:   ceFAZolin (ANCEF) IV 1 g (09/29/22 1859)   vancomycin 500 mg (09/28/22 1906)    Pressure Injury Coccyx Stage 2 -  Partial thickness loss of dermis presenting as a shallow open injury with a red, pink wound bed without slough. (Active)     Location: Coccyx  Location Orientation:   Staging: Stage 2 -  Partial thickness loss of dermis presenting as a shallow open injury with a red, pink wound bed without slough.  Wound Description (Comments):   Present on Admission: Yes   Diet Order             Diet regular Room service appropriate? No; Fluid consistency: Thin; Fluid restriction: 2000 mL Fluid  Diet effective now                  Intake/Output Summary (Last 24 hours) at 09/30/2022 0952 Last data filed at 09/29/2022 2200 Gross per 24 hour  Intake 150 ml  Output 325 ml  Net -175 ml   Net IO Since Admission: 825.59 mL [09/30/22 0952]  Wt Readings from Last 3 Encounters:  09/27/22 52 kg  09/15/22 51.7 kg  07/29/22 50.6 kg     Unresulted Labs (From admission, onward)     Start     Ordered   09/28/22 0800  Vitamin B1  Once,   R        09/28/22 0800   09/18/22 0500  CBC with Differential/Platelet  Daily  at 5am,   R      09/17/22 1612   09/18/22 0500  Renal function panel  Daily at 5am,   R      09/17/22 1612          Data Reviewed: I have personally reviewed following labs and imaging studies CBC: Recent Labs  Lab 09/26/22 0343 09/27/22 0441 09/28/22 0649 09/29/22 0546 09/30/22 0556  WBC 5.2 3.9* 4.0 4.4 5.4  NEUTROABS 3.1 2.2 2.3 2.5 3.3  HGB 10.2* 9.7* 10.6* 9.5* 8.3*  HCT 29.1* 28.1* 32.0* 27.6* 25.4*  MCV 97.7 98.3 101.3* 98.9 101.2*  PLT 268 263 239 219 578   Basic Metabolic Panel: Recent Labs  Lab 09/26/22 0343 09/27/22 0441 09/28/22 0650 09/29/22 0546 09/30/22 0556  NA 137 136 138 136 140  K 4.2 4.5 3.9 3.7 3.7  CL 98 98 96* 94* 101  CO2 '25 26 29 29 27  '$ GLUCOSE 93 94 91 91 101*  BUN 34* 41* '14 14 21  '$ CREATININE 7.54* 8.76* 4.50* 4.58* 6.55*  CALCIUM 10.7* 10.3 10.1 10.2 10.4*  PHOS 4.8* 5.4* 3.2 3.5 3.9   GFR: Estimated Creatinine Clearance: 6.4 mL/min (A) (by C-G formula based on SCr of 6.55 mg/dL (H)). Liver Function Tests: Recent Labs  Lab 09/26/22 0343 09/27/22 0441 09/28/22 0650 09/29/22 0546 09/30/22 0556  AST  --  17  --   --   --   ALT  --  8  --   --   --   ALKPHOS  --  53  --   --   --   BILITOT  --  0.6  --   --   --   PROT  --  6.4*  --   --   --   ALBUMIN 2.9* 2.6*  2.7* 2.8* 2.7* 2.5*   No results for input(s): "LIPASE", "AMYLASE" in the last 168 hours. No results for input(s): "AMMONIA" in the last 168 hours. Coagulation Profile: No results for input(s): "INR", "PROTIME" in the last 168 hours. BNP (last 3 results) No results for input(s): "PROBNP" in the last 8760 hours. HbA1C: No results for input(s): "HGBA1C" in the last 72 hours.  CBG: Recent Labs  Lab 09/30/22 0623  GLUCAP 103*  ntimicrobials: Anti-infectives (From admission, onward)    Start     Dose/Rate Route Frequency Ordered Stop   09/28/22 1400  vancomycin (VANCOREADY) IVPB 500 mg/100 mL        500 mg 100 mL/hr over 60 Minutes Intravenous Every T-Th-Sa  (Hemodialysis) 09/28/22 1308     09/28/22 1200  ceFAZolin (ANCEF) IVPB 1 g/50 mL premix  Status:  Discontinued        1 g 100 mL/hr over 30 Minutes Intravenous Every T-Th-Sa (Hemodialysis) 09/25/22 1446 09/25/22 1449   09/27/22 1400  vancomycin (VANCOREADY) IVPB 500 mg/100 mL        500 mg 100 mL/hr over 60 Minutes Intravenous  Once 09/27/22 1300 09/27/22 1947   09/27/22 1200  vancomycin (VANCOREADY) IVPB 500 mg/100 mL  Status:  Discontinued        500 mg 100 mL/hr over 60 Minutes Intravenous  Once 09/27/22 0950 09/27/22 1300   09/25/22 1800  ceFAZolin (ANCEF) IVPB 1 g/50 mL premix        1 g 100 mL/hr over 30 Minutes Intravenous Daily 09/25/22 1521 10/02/22 1759   09/25/22 1600  vancomycin (VANCOREADY) IVPB 500 mg/100 mL        500 mg 100 mL/hr over 60 Minutes Intravenous  Once 09/25/22 1501 09/25/22 1823   09/25/22 1545  ceFAZolin (ANCEF) IVPB 1 g/50 mL premix  Status:  Discontinued        1 g 100 mL/hr over 30 Minutes Intravenous Every T-Th-Sa (Hemodialysis) 09/25/22 1449 09/25/22 1521   09/25/22 1458  vancomycin variable dose per unstable renal function (pharmacist dosing)  Status:  Discontinued         Does not apply See admin instructions 09/25/22 1501 09/28/22 1308   09/18/22 1200  vancomycin (VANCOREADY) IVPB 500 mg/100 mL  Status:  Discontinued        500 mg 100 mL/hr over 60 Minutes Intravenous Every T-Th-Sa (Hemodialysis) 09/17/22 1243 09/25/22 1501   09/17/22 0945  ceFEPIme (MAXIPIME) 1 g in sodium chloride 0.9 % 100 mL IVPB  Status:  Discontinued        1 g 200 mL/hr over 30 Minutes Intravenous Every 24 hours 09/17/22 0944 09/17/22 1350   09/17/22 0945  vancomycin (VANCOREADY) IVPB 1250 mg/250 mL        1,250 mg 166.7 mL/hr over 90 Minutes Intravenous  Once 09/17/22 0944 09/17/22 1302   09/17/22 0944  vancomycin variable dose per unstable renal function (pharmacist dosing)  Status:  Discontinued         Does not apply See admin instructions 09/17/22 0944 09/20/22 1511       Culture/Microbiology    Component Value Date/Time   SDES BLOOD RIGHT HAND 09/20/2022 1502   SDES BLOOD RIGHT HAND 09/20/2022 1502   SPECREQUEST  09/20/2022 1502    BOTTLES DRAWN AEROBIC AND ANAEROBIC Blood Culture results may not be optimal due to an inadequate volume of blood received in culture bottles   SPECREQUEST  09/20/2022 1502    BOTTLES DRAWN AEROBIC AND ANAEROBIC Blood Culture adequate volume   CULT  09/20/2022 1502    NO GROWTH 5 DAYS Performed at [facility], [place] [address] [place], [place] [address]    CULT  09/20/2022 1502    NO GROWTH 5 DAYS Performed at [facility] [address] [place],  [address]    REPTSTATUS 09/25/2022 FINAL 09/20/2022 1502   REPTSTATUS 09/25/2022 FINAL  09/20/2022 1502  Radiology Studies: No results found.   LOS: 13 days   Antonieta Pert, MD [facility]  09/30/2022, 9:52 AM

## 2022-09-30 NOTE — Progress Notes (Signed)
Nephrology Progress Note:  Subjective:   She had last HD on 10/24 with 0.1 kg UF.  The patient terminated her treatment early.  I was contacted that there was some concern about her being able to dialyze in a chair yesterday - asked team to get her up and for HD in a chair today.  Per nursing she sat in a chair for them on the floor yesterday.  Patient asks what time she's going to HD and I let her know she was on the list for first shift but not sure what exact time.   Review of systems:  Denies shortness of breath or chest pain  Denies n/v   Objective Vital signs in last 24 hours: Vitals:   09/29/22 2128 09/29/22 2321 09/30/22 0410 09/30/22 0732  BP: 107/62 (!) 99/46 (!) 151/84 (!) 146/81  Pulse: 69 74 74 70  Resp: '18 14 16 19  '$ Temp: 98 F (36.7 C) 98.4 F (36.9 C) 98.4 F (36.9 C)   TempSrc: Oral Oral Oral   SpO2: 99% 96% 96% 100%  Weight:      Height:        Physical Exam:      General elderly female in bed  HEENT normocephalic atraumatic Neck supple trachea midline Lungs clear to auscultation bilaterally normal work of breathing at rest  Heart S1S2 no rub Abdomen soft nontender nondistended Extremities no edema  Psych no anxiety or agitation  Neuro more awake today and more interactive.  conversant   Access LUE AVF with bruit and thrill        OP HD: [place] [facility] 4h   400/600  53.5kg  2/2 bath  Hep none LUA AVF - last HD 10/10 post wt 54.1kg - last Hb 10.1 - mircera 100 q2, new start  - iron sucrose 50 mg weekly - doxercalciferol 3 ug iv tts      109/100, HR 90,  RR 15  97%    K+ 5.4  BUN 42  cr 7.53   Problem/Plan Acute metabolic encephalopathy -multiple etiology MRSA bacteremia with vertebral discitis/osteomyelitis, dementia, gabapentin.   Had not really missed any HD sessions, No sig azotemia.  her baseline is with some cognitive deficits and sundowning -  is worse in this setting.  Has improved but still ending treatments early.    2.   ESRD - on HD TTS.   Usually good compliance.  Here initially treatments complicated by Afib and hypotension and intolerance of UF;  now more behavioral issues. At this time just signing off early which is frustrating but doesn't preclude continuing HD - HD per TTS schedule - first shift if possible.  She does still want to continue dialysis. - outpatient she will need vanc with HD as below - I have spoken with her son and he shares she has always talked about stopping HD "ever since starting" but she has kept at it because she knows she needs it to stay alive. Continue goals of care discussions given recent noncompliance - appreciate palliative care   HTN /vol - improved on current regimen Progression of discitis/osteomyelitis L-spine on MRI on 09/17/2022/= ID consulting [facility] 10/13 1 of 4 bottles GPC, MRSA per [facility] ID on vancomycin, TTE ordered by ID and neurosurgery to eval repeat blood cultures 10/15 ordered/NS consulted noted no surgery needed currently-  will need vanc 500 mg three times a week with HD until 12/11 per ID Afib.  Note hx of rapid A-fib IV Cardizem drip started  by admit team 10/13 evening.  Per primary team  Anemia of esrd - On ESA weekly which is now at 200 mcg every tuesday. Fe IV weekly held in setting of infection MBD ckd - hypercalcemia, holding IV vdra. Cont renvela as binder.  more HD bath options for lower calcium as an outpatient  Disposition - per primary team.  Acceptable from a strictly renal standpoint  Labs: Basic Metabolic Panel: Recent Labs  Lab 09/28/22 0650 09/29/22 0546 09/30/22 0556  NA 138 136 140  K 3.9 3.7 3.7  CL 96* 94* 101  CO2 '29 29 27  '$ GLUCOSE 91 91 101*  BUN '14 14 21  '$ CREATININE 4.50* 4.58* 6.55*  CALCIUM 10.1 10.2 10.4*  PHOS 3.2 3.5 3.9   Liver Function Tests: Recent Labs  Lab 09/27/22 0441 09/28/22 0650 09/29/22 0546 09/30/22 0556  AST 17  --   --   --   ALT 8  --   --   --   ALKPHOS 53  --   --   --   BILITOT 0.6  --   --   --   PROT 6.4*  --    --   --   ALBUMIN 2.6*  2.7* 2.8* 2.7* 2.5*   No results for input(s): "LIPASE", "AMYLASE" in the last 168 hours. No results for input(s): "AMMONIA" in the last 168 hours.  CBC: Recent Labs  Lab 09/26/22 0343 09/27/22 0441 09/28/22 0649 09/29/22 0546 09/30/22 0556  WBC 5.2 3.9* 4.0 4.4 5.4  NEUTROABS 3.1 2.2 2.3 2.5 3.3  HGB 10.2* 9.7* 10.6* 9.5* 8.3*  HCT 29.1* 28.1* 32.0* 27.6* 25.4*  MCV 97.7 98.3 101.3* 98.9 101.2*  PLT 268 263 239 219 216   Cardiac Enzymes: No results for input(s): "CKTOTAL", "CKMB", "CKMBINDEX", "TROPONINI" in the last 168 hours. CBG: Recent Labs  Lab 09/30/22 0623  GLUCAP 103*       ceFAZolin (ANCEF) IV 1 g (09/29/22 1859)   vancomycin 500 mg (09/28/22 1906)    apixaban  2.5 mg Oral BID   Chlorhexidine Gluconate Cloth  6 each Topical Q0600   darbepoetin (ARANESP) injection - DIALYSIS  200 mcg Intravenous Q Tue-HD   diltiazem  120 mg Oral Daily   feeding supplement  1 Container Oral TID WC   hydrALAZINE  100 mg Oral 3 times per day on Sun Mon Wed Fri   hydrALAZINE  100 mg Oral 2 times per day on Tue Thu Sat   hydrocortisone cream   Topical TID   losartan  25 mg Oral Daily   metoprolol tartrate  50 mg Oral BID   mometasone-formoterol  2 puff Inhalation BID   multivitamin  1 tablet Oral QHS   pantoprazole  40 mg Oral BID   sevelamer carbonate  1,600 mg Oral TID WC     Claudia Desanctis, MD 09/30/2022 8:32 AM

## 2022-09-30 NOTE — Procedures (Signed)
Seen and examined on dialysis.  Blood pressure 124/88 and HR 69.  Tolerating goal.  LUE AVF in use.  Procedure supervised.  She is having HD in a chair.  She wants to get off but is amenable to stay.    Claudia Desanctis, MD 09/30/2022  1:58 PM

## 2022-10-01 DIAGNOSIS — G9341 Metabolic encephalopathy: Secondary | ICD-10-CM | POA: Diagnosis not present

## 2022-10-01 LAB — CBC WITH DIFFERENTIAL/PLATELET
Abs Immature Granulocytes: 0.01 10*3/uL (ref 0.00–0.07)
Basophils Absolute: 0 10*3/uL (ref 0.0–0.1)
Basophils Relative: 0 %
Eosinophils Absolute: 0.3 10*3/uL (ref 0.0–0.5)
Eosinophils Relative: 5 %
HCT: 26.2 % — ABNORMAL LOW (ref 36.0–46.0)
Hemoglobin: 8.9 g/dL — ABNORMAL LOW (ref 12.0–15.0)
Immature Granulocytes: 0 %
Lymphocytes Relative: 27 %
Lymphs Abs: 1.3 10*3/uL (ref 0.7–4.0)
MCH: 34.2 pg — ABNORMAL HIGH (ref 26.0–34.0)
MCHC: 34 g/dL (ref 30.0–36.0)
MCV: 100.8 fL — ABNORMAL HIGH (ref 80.0–100.0)
Monocytes Absolute: 0.6 10*3/uL (ref 0.1–1.0)
Monocytes Relative: 13 %
Neutro Abs: 2.6 10*3/uL (ref 1.7–7.7)
Neutrophils Relative %: 55 %
Platelets: 221 10*3/uL (ref 150–400)
RBC: 2.6 MIL/uL — ABNORMAL LOW (ref 3.87–5.11)
RDW: 18.8 % — ABNORMAL HIGH (ref 11.5–15.5)
WBC: 4.8 10*3/uL (ref 4.0–10.5)
nRBC: 0 % (ref 0.0–0.2)

## 2022-10-01 LAB — RENAL FUNCTION PANEL
Albumin: 2.5 g/dL — ABNORMAL LOW (ref 3.5–5.0)
Anion gap: 14 (ref 5–15)
BUN: 12 mg/dL (ref 8–23)
CO2: 27 mmol/L (ref 22–32)
Calcium: 9.9 mg/dL (ref 8.9–10.3)
Chloride: 100 mmol/L (ref 98–111)
Creatinine, Ser: 4.09 mg/dL — ABNORMAL HIGH (ref 0.44–1.00)
GFR, Estimated: 11 mL/min — ABNORMAL LOW (ref >60)
Glucose, Bld: 100 mg/dL — ABNORMAL HIGH (ref 70–99)
Phosphorus: 2.8 mg/dL (ref 2.5–4.6)
Potassium: 3.5 mmol/L (ref 3.5–5.1)
Sodium: 141 mmol/L (ref 135–145)

## 2022-10-01 MED ORDER — LOSARTAN POTASSIUM 25 MG PO TABS: 25.0000 mg | ORAL_TABLET | Freq: Every day | ORAL | Status: DC

## 2022-10-01 MED ORDER — VANCOMYCIN IV (FOR PTA / DISCHARGE USE ONLY): 500.0000 mg | INTRAVENOUS | 0 refills | Status: DC

## 2022-10-01 MED ORDER — CHLORHEXIDINE GLUCONATE CLOTH 2 % EX PADS: 6.0000 | MEDICATED_PAD | Freq: Every day | CUTANEOUS | Status: DC

## 2022-10-01 NOTE — Consult Note (Signed)
[facility] Followup Face-to-Face Psychiatric Evaluation   Service Date: October 01, 2022 LOS:  LOS: 14 days    Assessment  Diamond [name] is a 71 y.o. female admitted medically for 09/16/2022 10:11 PM following a fall. She carries no known psychiatric problems and has a past medical history of  ESRD on HD, PAF on eliquis, HTN, h/o recurrent MRSA bacteremia and vertebral discitis.Psychiatry was consulted for AMS/Delirium by Dr. Algis Liming.   Presentation continues to be consistent with delirium secondary to multiple etiologies including possibly mild cognitive impairment. Depakote discontinued. Continue to encourage re-orientation by family. Continues to experience some confusion following yesterday's HD but appears more animated today than when previously assessed.  Diagnoses:  Active Hospital problems: Principal Problem:   Acute metabolic encephalopathy Active Problems:   ESRD on hemodialysis ([place])   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    Paroxysmal atrial flutter (HCC)   Hypertensive urgency   Thyroid nodule   Empty sella (HCC)   PAF (paroxysmal atrial fibrillation) ([place])     Plan  ## Safety and Observation Level:  - Based on my clinical evaluation, I estimate the patient to be at low risk of self harm in the current setting   ##Delirium  ##Acute metabolic encephalopathy Possible component of dementia -Depakote discontinued -Delirium precautions -Encourage family to assist with patient reorientation -Agree with palliative note that patient will most benefit from earlier dialysis  ## Medical Decision Making Capacity:  Not formally assessed  ## Further Work-up:  -- Per primary team  -- most recent EKG on 09/27/2019 had QtC of 462 -- Pertinent labwork reviewed earlier this admission includes: normal LFT, B12 442, nonreactive RPR  ## Disposition:  -- SNF when medically stable  ## Behavioral / Environmental:  -- delirium  precuations  ##Legal Status Not under IVC  Thank you for this consult request. Recommendations have been communicated to the primary team.  We will sign off at this time.   France Ravens, MD   Relevant Aspects of Hospital Course:  Admitted on 09/16/2022 for a fall at home as well as intermittent confusion in the setting of missed dialysis  Patient Report:  Patient seen and assessed at bedside this morning.  Patient denies SI/HI/AVH. Patient denies confusion but also does not recall asking to discontinue HD past few times and that she does outpatient HD. Was unable to DOWB. Alert and oriented to self, time, and location.   ROS:  Denies SI/HI/AVH   Psychiatric History:  No Psychiatric hx of note    Family History:   The patient's family history includes Colon cancer in her brother; Hypertension in her father and mother; Lung cancer in her brother.  Medical History: Past Medical History:  Diagnosis Date   Anemia    Asthma    Atrial flutter with rapid ventricular response ([place]) 12/25/2021   Chronic kidney disease    dialysis Tues Thurs Sat   Gout    Hypertension     Surgical History: Past Surgical History:  Procedure Laterality Date   AV FISTULA PLACEMENT Left 12/23/2021   Procedure: LEFT ARM BRACHIOBASILIC VEIN ARTERIOVENOUS (AV) FISTULA CREATION;  Surgeon: [name], MD;  Location: [place];  Service: Vascular;  Laterality: Left;   [place] Left 03/08/2022   Procedure: LEFT SECOND STAGE BASILIC VEIN TRANSPOSITION;  Surgeon: [name], MD;  Location: [place];  Service: Vascular;  Laterality: Left;   BIOPSY  01/19/2022   Procedure: BIOPSY;  Surgeon: Bryan Lemma,  Dominic Pea, DO;  Location: [facility] ENDOSCOPY;  Service: Gastroenterology;;   [place] N/A 06/25/2022   Procedure: OPEN CHOLECYSTECTOMY;  Surgeon: Dwan Bolt, MD;  Location: [place];  Service: General;  Laterality: N/A;   COLONOSCOPY Left 01/19/2022   Procedure:  COLONOSCOPY;  Surgeon: Lavena Bullion, DO;  Location: [facility] [place];  Service: Gastroenterology;  Laterality: Left;   ESOPHAGOGASTRODUODENOSCOPY Left 01/19/2022   Procedure: ESOPHAGOGASTRODUODENOSCOPY (EGD);  Surgeon: Lavena Bullion, DO;  Location: [facility] ENDOSCOPY;  Service: Gastroenterology;  Laterality: Left;   INSERTION OF DIALYSIS CATHETER Right 12/23/2021   Procedure: INSERTION OF DIALYSIS CATHETER;  Surgeon: [name], MD;  Location: [place];  Service: Vascular;  Laterality: Right;   LAPAROSCOPY N/A 06/25/2022   Procedure: STAGING LAPAROSCOPY;  Surgeon: Dwan Bolt, MD;  Location: [place];  Service: General;  Laterality: N/A;   OPEN PARTIAL HEPATECTOMY  N/A 06/25/2022   Procedure: OPEN PARTIAL HEPATECTOMY;  Surgeon: Dwan Bolt, MD;  Location: [place];  Service: General;  Laterality: N/A;   POLYPECTOMY  01/19/2022   Procedure: POLYPECTOMY;  Surgeon: Lavena Bullion, DO;  Location: [place] ENDOSCOPY;  Service: Gastroenterology;;   TEE WITHOUT CARDIOVERSION N/A 12/22/2021   Procedure: TRANSESOPHAGEAL ECHOCARDIOGRAM (TEE);  Surgeon: Jerline Pain, MD;  Location: [facility] ENDOSCOPY;  Service: Cardiovascular;  Laterality: N/A;   TEE WITHOUT CARDIOVERSION N/A 06/17/2022   Procedure: TRANSESOPHAGEAL ECHOCARDIOGRAM (TEE);  Surgeon: Buford Dresser, MD;  Location: [facility] ENDOSCOPY;  Service: Cardiovascular;  Laterality: N/A;   ULTRASOUND GUIDANCE FOR VASCULAR ACCESS  12/23/2021   Procedure: ULTRASOUND GUIDANCE FOR VASCULAR ACCESS;  Surgeon: [name], MD;  Location: [facility] OR;  Service: Vascular;;    Medications:   Current Facility-Administered Medications:    acetaminophen (TYLENOL) tablet 650 mg, 650 mg, Oral, Q6H PRN, 650 mg at 09/25/22 2221 **OR** acetaminophen (TYLENOL) suppository 650 mg, 650 mg, Rectal, Q6H PRN, Alcario Drought, Jared M, DO   albuterol (PROVENTIL) (2.5 MG/3ML) 0.083% nebulizer solution 2.5 mg, 2.5 mg, Inhalation, Q4H PRN, Alcario Drought, Jared M, DO   apixaban Arne [place]) tablet 2.5  mg, 2.5 mg, Oral, BID, Alcario Drought, Jared M, DO, 2.5 mg at 09/30/22 2121   camphor-menthol (SARNA) lotion, , Topical, PRN, Alma Friendly, MD, Given at 09/29/22 1015   ceFAZolin (ANCEF) IVPB 1 g/50 mL premix, 1 g, Intravenous, Daily, Tobie Poet E, NP, Last Rate: 100 mL/hr at 09/30/22 2258, 1 g at 09/30/22 2258   Chlorhexidine Gluconate Cloth 2 % PADS 6 each, 6 each, Topical, Q0600, Corliss Parish, MD, 6 each at 10/01/22 0404   [START ON 10/05/2022] Darbepoetin Alfa (ARANESP) injection 200 mcg, 200 mcg, Subcutaneous, Q Tue-1800, Kc, Ramesh, MD   diltiazem (CARDIZEM CD) 24 hr capsule 120 mg, 120 mg, Oral, Daily, Donato Heinz, MD, 120 mg at 09/30/22 2122   feeding supplement (BOOST / RESOURCE BREEZE) liquid 1 Container, 1 Container, Oral, TID WC, Hongalgi, Anand D, MD   hydrALAZINE (APRESOLINE) tablet 100 mg, 100 mg, Oral, 3 times per day on Sun Mon Wed Fri, Danford, Christopher P, MD, 100 mg at 09/29/22 2130   hydrALAZINE (APRESOLINE) tablet 100 mg, 100 mg, Oral, 2 times per day on Tue Thu Sat, Danford, Christopher P, MD, 100 mg at 09/30/22 2122   hydrocortisone cream 1 %, , Topical, TID, Alma Friendly, MD, Given at 09/30/22 2127   labetalol (NORMODYNE) injection 10-20 mg, 10-20 mg, Intravenous, Q2H PRN, Etta Quill, DO, 10 mg at 09/28/22 0504   losartan (COZAAR) tablet  25 mg, 25 mg, Oral, Daily, Claudia Desanctis, MD, 25 mg at 09/30/22 1019   metoprolol tartrate (LOPRESSOR) tablet 50 mg, 50 mg, Oral, BID, Alcario Drought, Jared M, DO, 50 mg at 09/30/22 2123   mometasone-formoterol (DULERA) 200-5 MCG/ACT inhaler 2 puff, 2 puff, Inhalation, BID, Etta Quill, DO, 2 puff at 09/30/22 2250   multivitamin (RENA-VIT) tablet 1 tablet, 1 tablet, Oral, QHS, Hongalgi, Anand D, MD, 1 tablet at 09/30/22 2121   ondansetron (ZOFRAN) tablet 4 mg, 4 mg, Oral, Q6H PRN **OR** ondansetron (ZOFRAN) injection 4 mg, 4 mg, Intravenous, Q6H PRN, Etta Quill, DO   Oral care mouth rinse, 15  mL, Mouth Rinse, PRN, Alma Friendly, MD   pantoprazole (PROTONIX) EC tablet 40 mg, 40 mg, Oral, BID, Alcario Drought, Jared M, DO, 40 mg at 09/30/22 2121   sevelamer carbonate (RENVELA) tablet 1,600 mg, 1,600 mg, Oral, TID WC, Alcario Drought, Jared M, DO, 1,600 mg at 09/30/22 1019   vancomycin (VANCOREADY) IVPB 500 mg/100 mL, 500 mg, Intravenous, Q T,Th,Sa-HD, Alvira Philips, [facility], Stopped at 09/30/22 1900  Allergies: Allergies  Allergen Reactions   Shellfish Allergy Other (See Comments)    Unknown reaction    Other Rash    Pt states anesthesia makes her brake out in rash       Objective  Vital signs:  Temp:  [98.1 F (36.7 C)-99.3 F (37.4 C)] 99.3 F (37.4 C) (10/27 0356) Pulse Rate:  [62-89] 73 (10/27 0356) Resp:  [10-20] 18 (10/27 0356) BP: (108-156)/(56-127) 151/92 (10/27 0356) SpO2:  [98 %-100 %] 100 % (10/27 0356)  Psychiatric Specialty Exam:  Presentation  General Appearance: Appropriate for Environment; Casual  Eye Contact:Good  Speech:Clear and Coherent; Normal Rate  Speech Volume:Normal  Handedness:Right   Mood and Affect  Mood:Euthymic  Affect:Appropriate; Congruent   Thought Process  Thought Processes:Coherent; Goal Directed; Linear  Descriptions of Associations:Intact  Orientation:Full (Time, Place and Person)  Thought Content:Logical  Hallucinations:Hallucinations: None   Ideas of Reference:None  Suicidal Thoughts:Suicidal Thoughts: No   Homicidal Thoughts:Homicidal Thoughts: No    Sensorium  Memory:Immediate Good; Recent Good; Remote Good  Judgment:Fair  Insight:Fair   Executive Functions  Concentration:Good  Attention Span:Good  [place] of Knowledge:Good  Language:Good   Psychomotor Activity  Psychomotor Activity:Psychomotor Activity: Normal    Assets  Assets:Communication Skills; Desire for Improvement; Physical Health   Sleep  Sleep:Sleep: Fair     Physical Exam: Physical Exam Review of  Systems  Respiratory:  Negative for shortness of breath.   Cardiovascular:  Negative for chest pain.  Gastrointestinal:  Negative for abdominal pain, constipation, diarrhea, heartburn, nausea and vomiting.  Neurological:  Negative for headaches.   Blood pressure (!) 151/92, pulse 73, temperature 99.3 F (37.4 C), temperature source Oral, resp. rate 18, height '5\' 7"'$  (1.702 m), weight 52 kg, SpO2 100 %. Body mass index is 17.96 kg/m.

## 2022-10-01 NOTE — Progress Notes (Signed)
Report called to [facility].  They have asked for patients peripheral iv to remain in place due to patient being a hard iv stick and she will be getting IV Vancomycin.  Discharge paperwork has been printed and placed in folder.

## 2022-10-01 NOTE — Progress Notes (Signed)
PHARMACY ANTIBIOTIC CONSULT NOTE   Diamond [name] a 71 y.o. female admitted with MRSA discitis/osteomyelitis.  Patient is ESRD on HD TTS and is scheduled to receive vancomycin with HD through 11/15/22 per ID recommendations. Patient tolerated 3.5 hour HD session as scheduled on 10/26.    10/21 pre-HD vanc level: 13 and subtherapeutic - 500 mg was given (expected level after this dose ~24).  Estimated Creatinine Clearance: 10.2 mL/min (A) (by C-G formula based on SCr of 4.09 mg/dL (H)).  Plan: Continue Vancomycin 500 mg IV after HD on TTS - please call pharmacy to reschedule if refuses HD Check preHD level at steady-state F/U HD tolerability   Allergies:  Allergies  Allergen Reactions   Shellfish Allergy Other (See Comments)    Unknown reaction    Other Rash    Pt states anesthesia makes her brake out in rash    Filed Weights   09/23/22 1744 09/27/22 1522 09/27/22 1959  Weight: 52.6 kg (115 lb 15.4 oz) 52 kg (114 lb 10.2 oz) 52 kg (114 lb 10.2 oz)       Latest Ref Rng & Units 10/01/2022    6:49 AM 09/30/2022    5:56 AM 09/29/2022    5:46 AM  CBC  WBC 4.0 - 10.5 K/uL 4.8  5.4  4.4   Hemoglobin 12.0 - 15.0 g/dL 8.9  8.3  9.5   Hematocrit 36.0 - 46.0 % 26.2  25.4  27.6   Platelets 150 - 400 K/uL 221  216  219     Antibiotics Given (last 72 hours)     Date/Time Action Medication Dose Rate   09/28/22 1831 New Bag/Given   ceFAZolin (ANCEF) IVPB 1 g/50 mL premix 1 g 100 mL/hr   09/28/22 1906 New Bag/Given   vancomycin (VANCOREADY) IVPB 500 mg/100 mL 500 mg 100 mL/hr   09/29/22 1859 New Bag/Given   ceFAZolin (ANCEF) IVPB 1 g/50 mL premix 1 g 100 mL/hr   09/30/22 1458 New Bag/Given   vancomycin (VANCOREADY) IVPB 500 mg/100 mL 500 mg 100 mL/hr   09/30/22 2258 New Bag/Given   ceFAZolin (ANCEF) IVPB 1 g/50 mL premix 1 g 100 mL/hr       Thank you for involving pharmacy in this patient's care.  [employer], Pharm.D., BCPS Clinical Pharmacist Clinical phone for  10/01/2022 from 7:30-3:00 is [phone].  **Pharmacist phone directory can be found on [email or web address] listed under [place].  10/01/2022 12:28 PM

## 2022-10-01 NOTE — Discharge Summary (Signed)
Physician Discharge Summary  Diamond [name]:[id number] DOB: [birth year] DOA: 09/16/2022  PCP: [name], MD  Admit date: 09/16/2022 Discharge date: 10/01/2022 Recommendations for Outpatient Follow-up:  Follow up with PCP in 1 weeks-call for appointment Please obtain BMP/CBC in one week Follow-up with reschedule dialysis  Discharge Dispo: SNF Discharge Condition: Stable Code Status:   Code Status: Full Code Diet recommendation:  Diet Order             Diet regular Room service appropriate? No; Fluid consistency: Thin; Fluid restriction: 2000 mL Fluid  Diet effective now                    Brief/Interim Summary: 70yof with ESRD on HD, PAF on Eliquis, HTN, and recurrent MRSA bacteremia and vertebral discitis  completed prolonged IV followed by oral antibiotics who presented after a fall and back pain and confusion. Secondary admitted found to have recurrent MRSA bacteremia with lumbar discitis/osteomyelitis, acute on chronic back pain.  Seen by ID, nephrology, psychiatry and palliative medicine. She will need 8 weeks of IV vancomycin, post HD.  She has had recurrent A-fib with RVR since 10/17, reverted to sinus rhythm.  She has had ongoing altered mental status seen by psychiatry- cont to avoid sedative meds/opioids etc,avoid QT prolonging meds.  Overall mentation has much improved alert awake oriented communicative interactive.  She wants to continue with dialysis.  Appears deconditioned weak PT OT working with her with good progress towards PT goal. 10/27 P2P completed and SNF approved.   Discharge Diagnoses:  Principal Problem:   Acute metabolic encephalopathy Active Problems:   Hypertensive urgency   ESRD on hemodialysis ([place])   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    Paroxysmal atrial flutter (H[facility])   Thyroid nodule   Empty sella (HCC)   PAF (paroxysmal atrial fibrillation) ([facility])  MRSA bacteremia Osteomyelitis/lumbar  discitis Ventral epidural abscess: See summary from 10/24.  Appreciate ID input plan is to continue IV vancomycin post HD x8 weeks and ID follow-up;will need vanc 500 mg three times a week with HD until 11/15/22 as per ID   Acute metabolic encephalopathy: Delirium due to her infection and superimposed on probably some mild cognitive impairment. Dr Loleta Books had long conversation with family [id number] the implication of this confusion for her continued dialysis. In the last week, pateint has not shown steady improvement reports being able to complete dialysis in the chair setting, on a consistent basis.  She is poorly able to sit up for long periods of time. Much more alert awake interactive.  Depakote discontinued, continue PT OT supportive care minimize sedatives Patient able to complete dialysis in chair, mentation alert awake oriented wants to continue dialysis for now.  She is medically stable discharge to skilled nursing facility.  Will need continued outpatient palliative care follow-up   Possible AV fistula infection Ancef was given. She is already on Vanco.discussed with nephrology and okay for discharge   Hypertensive urgency: BP has stabilized.  On losartan metoprolol hydralazine tid on HD day and Bid on non HD day. Please see summary from 10/24   ESRD on hemodialysis: Nephro on board, got HD 10/26 night completed full treatment.  No further plan from nephrology okay for discharge advised outpatient palliative care follow-up.   QPR:FFMB Eliquis and Cardizem.   Empty sella:Incidental finding, asymptomatic   Thyroid nodule:S/p thyroid US-No follow-up needed   Moderate malnutrition augment diet as below Nutrition Problem: Moderate Malnutrition Etiology: chronic illness (ESRD on HD) Signs/Symptoms:  moderate muscle depletion, moderate fat depletion, percent weight loss (12% x 6 months) Percent weight loss: 12 % Interventions: Refer to RD note for  recommendations  Consults: Nephrology, infectious disease, psychiatry, palliative care, call neurology, neuro surgery Subjective: Alert awake oriented resting comfortably no new complaints, motivated and worked with PT this morning  Discharge Exam: Vitals:   10/01/22 0858 10/01/22 1208  BP: (!) 182/99 (!) 147/86  Pulse: 68 72  Resp: 18 17  Temp: 98.4 F (36.9 C) 98.8 F (37.1 C)  SpO2: 97% 100%   General: Pt is alert, awake, not in acute distress Cardiovascular: RRR, S1/S2 +, no rubs, no gallops Respiratory: CTA bilaterally, no wheezing, no rhonchi Abdominal: Soft, NT, ND, bowel sounds + Extremities: no edema, no cyanosis  Discharge Instructions  Discharge Instructions     [facility] pharmacist to adjust dose for Vancomycin, Aminoglycosides and other anti-infective therapies as requested by physician.   Complete by: As directed    [facility] to provide Cath Flo 45m   Complete by: As directed    Administer for PICC line occlusion and as ordered by physician for other access device issues.   Anaphylaxis Kit: Provided to treat any anaphylactic reaction to the medication being provided to the patient if First Dose or when requested by physician   Complete by: As directed    Epinephrine 18mml vial / amp: Administer 0.67m467m0.67ml36mubcutaneously once for moderate to severe anaphylaxis, nurse to call physician and pharmacy when reaction occurs and call 911 if needed for immediate care   Diphenhydramine 50mg48mIV vial: Administer 25-50mg 85mM PRN for first dose reaction, rash, itching, mild reaction, nurse to call physician and pharmacy when reaction occurs   Sodium Chloride 0.9% NS 500ml I6mdminister if needed for hypovolemic blood pressure drop or as ordered by physician after call to physician with anaphylactic reaction   Change dressing on IV access line weekly and PRN   Complete by: As directed    Discharge instructions   Complete by: As directed     Continue vancomycin 500 mg after dialysis as ordered through 11/15/2022.  Patient will need outpatient palliative care follow-up in the skilled nursing facility.  Please call call MD or return to ER for similar or worsening recurring problem that brought you to hospital or if any fever,nausea/vomiting,abdominal pain, uncontrolled pain, chest pain,  shortness of breath or any other alarming symptoms.  Please follow-up your doctor as instructed in a week time and call the office for appointment.  Please avoid alcohol, smoking, or any other illicit substance and maintain healthy habits including taking your regular medications as prescribed.  You were cared for by a hospitalist during your hospital stay. If you have any questions about your discharge medications or the care you received while you were in the hospital after you are discharged, you can call the unit and ask to speak with the hospitalist on call if the hospitalist that took care of you is not available.  Once you are discharged, your primary care physician will handle any further medical issues. Please note that NO REFILLS for any discharge medications will be authorized once you are discharged, as it is imperative that you return to your primary care physician (or establish a relationship with a primary care physician if you do not have one) for your aftercare needs so that they can reassess your need for medications and monitor your lab values   Flush IV access with Sodium Chloride 0.9% and Heparin  10 units/ml or 100 units/ml   Complete by: As directed    Home infusion instructions - [facility]   Complete by: As directed    Instructions: Flush IV access with Sodium Chloride 0.9% and Heparin 10units/ml or 100units/ml   Change dressing on IV access line: Weekly and PRN   Instructions Cath Flo 31m: Administer for PICC Line occlusion and as ordered by physician for other access device   [facility] pharmacist to  adjust dose for: Vancomycin, Aminoglycosides and other anti-infective therapies as requested by physician   Increase activity slowly   Complete by: As directed    Method of administration may be changed at the discretion of home infusion pharmacist based upon assessment of the patient and/or caregiver's ability to self-administer the medication ordered   Complete by: As directed       Allergies as of 10/01/2022       Reactions   Shellfish Allergy Other (See Comments)   Unknown reaction    Other Rash   Pt states anesthesia makes her brake out in rash        Medication List     TAKE these medications    acetaminophen 325 MG tablet Commonly known as: TYLENOL Take 2 tablets (650 mg total) by mouth 4 (four) times daily. What changed:  when to take this reasons to take this   albuterol 108 (90 Base) MCG/ACT inhaler Commonly known as: VENTOLIN HFA Inhale 2 puffs into the lungs every 4 (four) hours as needed for wheezing or shortness of breath.   apixaban 2.5 MG Tabs tablet Commonly known as: ELIQUIS Take 1 tablet (2.5 mg total) by mouth 2 (two) times daily.   fluticasone-salmeterol 250-50 MCG/ACT Aepb Commonly known as: ADVAIR Inhale 1 puff into the lungs daily as needed (for shortness of breath).   gabapentin 100 MG capsule Commonly known as: Neurontin Take 1 capsule (100 mg total) by mouth at bedtime.   hydrALAZINE 100 MG tablet Commonly known as: APRESOLINE Take 100 mg by mouth See admin instructions. Take 1 tablet by mouth twice a day on dialysis days, then take 1 tablet three times a day on non dialysis days   losartan 25 MG tablet Commonly known as: COZAAR Take 1 tablet (25 mg total) by mouth daily. Start taking on: October 02, 2022   metoprolol tartrate 50 MG tablet Commonly known as: LOPRESSOR Take 1 tablet (50 mg total) by mouth 2 (two) times daily.   pantoprazole 40 MG tablet Commonly known as: PROTONIX Take 1 tablet (40 mg total) by mouth 2 (two)  times daily.   sevelamer carbonate 800 MG tablet Commonly known as: RENVELA Take 2 tablets (1,600 mg total) by mouth 3 (three) times daily with meals.   vancomycin  IVPB Inject 500 mg into the vein Every Tuesday,Thursday,and Saturday with dialysis. Indication:  MRSA discitis/osteomyelitis  First Dose: Yes Last Day of Therapy:  11/15/22 Labs - Sunday/Monday:  CBC/D, BMP, and vancomycin trough. Labs - Thursday:  BMP and vancomycin trough Labs - Every other week:  ESR and CRP Method of administration:Elastomeric Method of administration may be changed at the discretion of the patient and/or caregiver's ability to self-administer the medication ordered. Start taking on: October 02, 2022               Discharge Care Instructions  (From admission, onward)           Start     Ordered   10/01/22 0000  Change dressing on  IV access line weekly and PRN  (Home infusion instructions - [facility] )        10/01/22 1312            Follow-up Information     Osei-Bonsu, Iona Beard, MD Follow up in 1 week(s).   Specialty: Internal Medicine Contact information: [address] [place] [address] [phone]                Allergies  Allergen Reactions   Shellfish Allergy Other (See Comments)    Unknown reaction    Other Rash    Pt states anesthesia makes her brake out in rash    The results of significant diagnostics from this hospitalization (including imaging, microbiology, ancillary and laboratory) are listed below for reference.    Microbiology: No results found for this or any previous visit (from the past 240 hour(s)).  Procedures/Studies: ECHOCARDIOGRAM COMPLETE  Result Date: 09/22/2022    ECHOCARDIOGRAM REPORT   Patient Name:   Diamond [name] Date of Exam: 09/22/2022 Medical Rec #:  [id number]      Height:       66.0 in Accession #:    [id number]     Weight:       115.5 lb Date of Birth:  02/05/1951     BSA:          1.584 m  Patient Age:    22 years       BP:           120/81 mmHg Patient Gender: F              HR:           67 bpm. Exam Location:  Inpatient Procedure: 2D Echo, Color Doppler and Cardiac Doppler Indications:    Bacteremia  History:        Patient has prior history of Echocardiogram examinations, most                 recent 08/16/2022. Arrythmias:Atrial Flutter; Risk                 Factors:Hypertension.  Sonographer:    Ronny Flurry Referring Phys: [id number] [place]  1. Left ventricular ejection fraction, by estimation, is 60 to 65%. The left ventricle has normal function. The left ventricle has no regional wall motion abnormalities. There is mild concentric left ventricular hypertrophy. Left ventricular diastolic function could not be evaluated.  2. Right ventricular systolic function is normal. The right ventricular size is normal. There is normal pulmonary artery systolic pressure.  3. Left atrial size was moderately dilated.  4. Previously noted mobile mass in the RA not visible on this study. Right atrial size was mildly dilated.  5. The mitral valve is abnormal. Mild mitral valve regurgitation. No evidence of mitral stenosis.  6. Tricuspid valve regurgitation is mild to moderate.  7. The aortic valve is tricuspid. There is mild calcification of the aortic valve. Aortic valve regurgitation is trivial. Aortic valve sclerosis/calcification is present, without any evidence of aortic stenosis. Aortic valve area, by VTI measures 2.96 cm. Aortic valve mean gradient measures 3.0 mmHg. Aortic valve Vmax measures 1.24 m/s.  8. Aortic dilatation noted. There is borderline dilatation of the ascending aorta, measuring 38 mm.  9. The inferior vena cava is dilated in size with <50% respiratory variability, suggesting right atrial pressure of 15 mmHg. FINDINGS  Left Ventricle: Left ventricular ejection fraction, by estimation, is 60 to 65%. The  left ventricle has normal function. The left ventricle has no  regional wall motion abnormalities. The left ventricular internal cavity size was normal in size. There is  mild concentric left ventricular hypertrophy. Left ventricular diastolic function could not be evaluated due to atrial fibrillation. Left ventricular diastolic function could not be evaluated. Right Ventricle: The right ventricular size is normal. No increase in right ventricular wall thickness. Right ventricular systolic function is normal. There is normal pulmonary artery systolic pressure. The tricuspid regurgitant velocity is 2.27 m/s, and  with an assumed right atrial pressure of 15 mmHg, the estimated right ventricular systolic pressure is 21.3 mmHg. Left Atrium: Left atrial size was moderately dilated. Right Atrium: Previously noted mobile mass in the RA not visible on this study. Right atrial size was mildly dilated. Pericardium: There is no evidence of pericardial effusion. Mitral Valve: The mitral valve is abnormal. There is mild thickening of the mitral valve leaflet(s). Mild mitral valve regurgitation. No evidence of mitral valve stenosis. Tricuspid Valve: The tricuspid valve is normal in structure. Tricuspid valve regurgitation is mild to moderate. No evidence of tricuspid stenosis. Aortic Valve: The aortic valve is tricuspid. There is mild calcification of the aortic valve. Aortic valve regurgitation is trivial. Aortic valve sclerosis/calcification is present, without any evidence of aortic stenosis. Aortic valve mean gradient measures 3.0 mmHg. Aortic valve peak gradient measures 6.2 mmHg. Aortic valve area, by VTI measures 2.96 cm. Pulmonic Valve: The pulmonic valve was normal in structure. Pulmonic valve regurgitation is not visualized. No evidence of pulmonic stenosis. Aorta: Aortic dilatation noted. There is borderline dilatation of the ascending aorta, measuring 38 mm. Venous: The inferior vena cava is dilated in size with less than 50% respiratory variability, suggesting right atrial  pressure of 15 mmHg. IAS/Shunts: No atrial level shunt detected by color flow Doppler.  LEFT VENTRICLE PLAX 2D LVIDd:         4.10 cm   Diastology LVIDs:         2.60 cm   LV e' medial:    7.72 cm/s LV PW:         1.30 cm   LV E/e' medial:  17.1 LV IVS:        1.20 cm   LV e' lateral:   8.59 cm/s LVOT diam:     2.20 cm   LV E/e' lateral: 15.4 LV SV:         78 LV SV Index:   49 LVOT Area:     3.80 cm  RIGHT VENTRICLE             IVC RV S prime:     15.10 cm/s  IVC diam: 2.80 cm TAPSE (M-mode): 1.7 cm LEFT ATRIUM             Index        RIGHT ATRIUM           Index LA diam:        3.90 cm 2.46 cm/m   RA Area:     13.00 cm LA Vol (A2C):   71.8 ml 45.30 ml/m  RA Volume:   27.70 ml  17.49 ml/m LA Vol (A4C):   77.2 ml 48.75 ml/m LA Biplane Vol: 73.0 ml 46.09 ml/m  AORTIC VALVE AV Area (Vmax):    3.57 cm AV Area (Vmean):   3.50 cm AV Area (VTI):     2.96 cm AV Vmax:           124.00 cm/s AV Vmean:  81.800 cm/s AV VTI:            0.263 m AV Peak Grad:      6.2 mmHg AV Mean Grad:      3.0 mmHg LVOT Vmax:         116.33 cm/s LVOT Vmean:        75.400 cm/s LVOT VTI:          0.205 m LVOT/AV VTI ratio: 0.78  AORTA Ao Root diam: 3.40 cm Ao Asc diam:  3.75 cm MV E velocity: 132.00 cm/s  TRICUSPID VALVE                             TR Peak grad:   20.6 mmHg                             TR Vmax:        227.00 cm/s                              SHUNTS                             Systemic VTI:  0.20 m                             Systemic Diam: 2.20 cm Glori Bickers MD Electronically signed by Glori Bickers MD Signature Date/Time: 09/22/2022/4:24:56 PM    Final    MR LUMBAR SPINE WO CONTRAST  Result Date: 09/17/2022 CLINICAL DATA:  Epidural abscess suspect EXAM: MRI LUMBAR SPINE WITHOUT CONTRAST TECHNIQUE: Multiplanar, multisequence MR imaging of the lumbar spine was performed. No intravenous contrast was administered. COMPARISON:  MRI L spine 07/26/22, CT L Spine 09/16/22 FINDINGS: Segmentation: In keeping  with prior numbering convention the last well-formed disc space is labeled L5-S1. Alignment:  There is mild anterolisthesis of L3 on L4. Vertebrae: Redemonstrated are multiple compression deformities in the lumbar spine, including at the L2, L3, L4, and L5 levels. These compression deformities appear grossly unchanged compared to prior lumbar spine MRI dated 07/26/2022. On CT of the L-spine obtained on 09/16/2022 there is irregular endplate erosion, best seen at the L4-L5 level. Surrounding these compression deformities is extensive T2 hyperintense signal abnormality in the paraspinal soft tissues with extension into the bilateral psoas musculature (series 7, images 24-32). Abnormal T2 signal is also seen along the ventral margin (series 7, image 20) of the vertebral bodies. Compared to prior exam from August, the degree of soft tissue abnormality has increased, best seen at the L2-L3 disc space level. Compared to prior exam the amount of T2 heterogeneous soft tissue within the ventral epidural space at this level has also increased (series 7,image 25) now resulting in moderate to severe spinal canal stenosis, previously mild-to-moderate. There is also increased abnormal soft tissue in the ventral epidural space along the posterior margin of the L3 vertebral body (series 2, image 8) with persistent moderate to severe spinal canal narrowing at the L3-L4 disc space level. There is persistent severe spinal canal narrowing at the L5-S1 level secondary to a central disc protrusion. Conus medullaris and cauda equina: Conus extends to the L1 level. Conus and cauda equina appear normal. IMPRESSION: 1. Findings are concerning for progression of discitis/osteomyelitis with increased soft  tissue extent of infection in the paraspinal and epidural spaces. Compared to prior exam from 07/26/2022, there is increased abnormal soft tissue in the ventral epidural space at the L2-L3 and L3-L4 disc space levels resulting in moderate to  severe spinal canal narrowing at these levels. There is also increased signal abnormality in the surrounding paraspinal soft tissues and bilateral psoas musculature. 2. Persistent severe spinal canal narrowing at the L5-S1 level secondary to a central disc protrusion. 3. Stable compression deformities of the L2, L3, L4, and L5 vertebral bodies with irregular endplate erosion at the L4-L5 level, best seen on CT of the L-spine obtained on 09/16/2022. Electronically Signed   By: Marin Roberts M.D.   On: 09/17/2022 16:26   US THYROID  Result Date: 09/17/2022 CLINICAL DATA:  Nodule EXAM: THYROID ULTRASOUND TECHNIQUE: Ultrasound examination of the thyroid gland and adjacent soft tissues was performed. COMPARISON:  None Available. FINDINGS: Parenchymal Echotexture: Moderately heterogenous Isthmus: 0.5 cm thickness Right lobe: 3.6 x 1.6 x 1.3 cm Left lobe: 3.5 x 2 x 2 cm _________________________________________________________ Estimated total number of nodules >/= 1 cm: 2 Number of spongiform nodules >/=  2 cm not described below (TR1): 0 Number of mixed cystic and solid nodules >/= 1.5 cm not described below (TR2): 0 _________________________________________________________ Nodule # 1: Location: Right; mid Maximum size: 1.2 cm; Other 2 dimensions: 0.8 x 1.2 cm Composition: solid/almost completely solid (2) Echogenicity: hypoechoic (2) Shape: not taller-than-wide (0) Margins: smooth (0) Echogenic foci: none (0) ACR TI-RADS total points: 4. ACR TI-RADS risk category: TR 4. ACR TI-RADS recommendations: *Given size (>/= 1 - 1.4 cm) and appearance, a follow-up ultrasound in 1 year should be considered based on TI-RADS criteria. _________________________________________________________ Nodule # 2: Location: Left; mid Maximum size: 1.4 cm; Other 2 dimensions: 1.1 x 1.2 cm Composition: solid/almost completely solid (2) Echogenicity: hypoechoic (2) Shape: not taller-than-wide (0) Margins: smooth (0) Echogenic foci: peripheral  calcifications (2) ACR TI-RADS total points: 6. ACR TI-RADS risk category: TR 4. ACR TI-RADS recommendations: *Given size (>/= 1 - 1.4 cm) and appearance, a follow-up ultrasound in 1 year should be considered based on TI-RADS criteria. _________________________________________________________ No regional cervical adenopathy. IMPRESSION: 1. Normal-sized thyroid with bilateral nodules. Neither meets criteria for biopsy. Recommend annual/biennial ultrasound follow-up as above, until stability x5 years confirmed. The above is in keeping with the ACR TI-RADS recommendations - J Am Coll Radiol 2017;14:587-595. Electronically Signed   By: Lucrezia Europe M.D.   On: 09/17/2022 12:07   CT Lumbar Spine Wo Contrast  Result Date: 09/17/2022 CLINICAL DATA:  Lumbar compression fracture EXAM: CT LUMBAR SPINE WITHOUT CONTRAST TECHNIQUE: Multidetector CT imaging of the lumbar spine was performed without intravenous contrast administration. Multiplanar CT image reconstructions were also generated. RADIATION DOSE REDUCTION: This exam was performed according to the departmental dose-optimization program which includes automated exposure control, adjustment of the mA and/or kV according to patient size and/or use of iterative reconstruction technique. COMPARISON:  07/25/2022 FINDINGS: Segmentation: Transitional L5 vertebrae with bilateral assimilation joints. Alignment: Grade 1 anterolisthesis at L3-4 Vertebrae: Diffuse osteopenia. There is no acute compression fracture. There is redemonstration of a nonacute L3 compression fracture with an osteonecrotic cleft beneath the superior endplate which has expanded slightly but is otherwise unchanged. Advanced degenerative disc disease with endplate remodeling at M7-6 is unchanged. Paraspinal and other soft tissues: Calcific aortic atherosclerosis Disc levels: L1-2: Mild disc bulge.  Without high-grade stenosis L2-3: Unchanged retropulsion of posterosuperior corner with moderate spinal canal  stenosis. L3-4: Unchanged severe spinal  canal stenosis due to combination of ligamentum flavum bulk and large disc bulge. L4-5: Moderate disc bulge with endplate spurring. Unchanged mild spinal canal stenosis. L5-S1: Transitional level IMPRESSION: 1. No acute compression fracture. 2. Unchanged nonacute L3 compression fracture with an osteonecrotic cleft beneath the superior endplate 3. Unchanged severe spinal canal stenosis at L3-4 and moderate spinal canal stenosis at L2-3. Aortic Atherosclerosis (ICD10-I70.0). Electronically Signed   By: Ulyses Jarred M.D.   On: 09/17/2022 00:04   CT Cervical Spine Wo Contrast  Result Date: 09/16/2022 CLINICAL DATA:  Polytrauma, blunt Level 2 fall EXAM: CT HEAD WITHOUT CONTRAST CT CERVICAL SPINE WITHOUT CONTRAST TECHNIQUE: Multidetector CT imaging of the head and cervical spine was performed following the standard protocol without intravenous contrast. Multiplanar CT image reconstructions of the cervical spine were also generated. RADIATION DOSE REDUCTION: This exam was performed according to the departmental dose-optimization program which includes automated exposure control, adjustment of the mA and/or kV according to patient size and/or use of iterative reconstruction technique. COMPARISON:  MR thoracic spine 07/26/2022 FINDINGS: CT HEAD FINDINGS BRAIN: BRAIN Cerebral ventricle sizes are concordant with the degree of cerebral volume loss. Patchy and confluent areas of decreased attenuation are noted throughout the deep and periventricular white matter of the cerebral hemispheres bilaterally, compatible with chronic microvascular ischemic disease. No evidence of large-territorial acute infarction. No parenchymal hemorrhage. No mass lesion. No extra-axial collection. No mass effect or midline shift. No hydrocephalus. Basilar cisterns are patent. Empty sella. Vascular: No hyperdense vessel. Atherosclerotic calcifications are present within the cavernous internal carotid  arteries. Skull: No acute fracture or focal lesion. Sinuses/Orbits: Paranasal sinuses and mastoid air cells are clear. The orbits are unremarkable. Other: None. CT CERVICAL SPINE FINDINGS Alignment: Similar-appearing retrolisthesis of C5 on C6. Skull base and vertebrae: Similar-appearing severe degenerative changes of the C5-C6 level with anterior wedge deformity of the C5 level. Posterior disc osteophyte complex formation at the C3-C4 and C4-C5 level. No severe osseous neural foraminal or central canal stenosis. No acute fracture. No aggressive appearing focal osseous lesion or focal pathologic process. Soft tissues and spinal canal: No prevertebral fluid or swelling. No visible canal hematoma. Upper chest: Unremarkable. Other: Severe atherosclerotic plaque of the visualized thoracic aorta. A 1.5 cm calcified hypodensity within the left thyroid gland. IMPRESSION: 1.  No acute intracranial abnormality. 2. No acute displaced fracture or traumatic listhesis of the cervical spine in a patient with chronic severe C5-C6 degenerative changes. 3. Empty sella. Findings is often a normal anatomic variant but can be associated with idiopathic intracranial hypertension (pseudotumor cerebri). 4. A 1.5 cm calcified hypodensity within the left thyroid gland. Recommend thyroid US (ref: J Am Coll Radiol. 2015 Feb;12(2): 143-50). Electronically Signed   By: Iven Finn M.D.   On: 09/16/2022 23:17   CT HEAD WO CONTRAST (5MM)  Result Date: 09/16/2022 CLINICAL DATA:  Polytrauma, blunt Level 2 fall EXAM: CT HEAD WITHOUT CONTRAST CT CERVICAL SPINE WITHOUT CONTRAST TECHNIQUE: Multidetector CT imaging of the head and cervical spine was performed following the standard protocol without intravenous contrast. Multiplanar CT image reconstructions of the cervical spine were also generated. RADIATION DOSE REDUCTION: This exam was performed according to the departmental dose-optimization program which includes automated exposure control,  adjustment of the mA and/or kV according to patient size and/or use of iterative reconstruction technique. COMPARISON:  MR thoracic spine 07/26/2022 FINDINGS: CT HEAD FINDINGS BRAIN: BRAIN Cerebral ventricle sizes are concordant with the degree of cerebral volume loss. Patchy and confluent areas of decreased attenuation are  noted throughout the deep and periventricular white matter of the cerebral hemispheres bilaterally, compatible with chronic microvascular ischemic disease. No evidence of large-territorial acute infarction. No parenchymal hemorrhage. No mass lesion. No extra-axial collection. No mass effect or midline shift. No hydrocephalus. Basilar cisterns are patent. Empty sella. Vascular: No hyperdense vessel. Atherosclerotic calcifications are present within the cavernous internal carotid arteries. Skull: No acute fracture or focal lesion. Sinuses/Orbits: Paranasal sinuses and mastoid air cells are clear. The orbits are unremarkable. Other: None. CT CERVICAL SPINE FINDINGS Alignment: Similar-appearing retrolisthesis of C5 on C6. Skull base and vertebrae: Similar-appearing severe degenerative changes of the C5-C6 level with anterior wedge deformity of the C5 level. Posterior disc osteophyte complex formation at the C3-C4 and C4-C5 level. No severe osseous neural foraminal or central canal stenosis. No acute fracture. No aggressive appearing focal osseous lesion or focal pathologic process. Soft tissues and spinal canal: No prevertebral fluid or swelling. No visible canal hematoma. Upper chest: Unremarkable. Other: Severe atherosclerotic plaque of the visualized thoracic aorta. A 1.5 cm calcified hypodensity within the left thyroid gland. IMPRESSION: 1.  No acute intracranial abnormality. 2. No acute displaced fracture or traumatic listhesis of the cervical spine in a patient with chronic severe C5-C6 degenerative changes. 3. Empty sella. Findings is often a normal anatomic variant but can be associated with  idiopathic intracranial hypertension (pseudotumor cerebri). 4. A 1.5 cm calcified hypodensity within the left thyroid gland. Recommend thyroid US (ref: J Am Coll Radiol. 2015 Feb;12(2): 143-50). Electronically Signed   By: Iven Finn M.D.   On: 09/16/2022 23:17   DG Pelvis Portable  Result Date: 09/16/2022 CLINICAL DATA:  Level 2 trauma, fall. History of fracture 2 lower back from years ago. EXAM: PORTABLE PELVIS 1-2 VIEWS COMPARISON:  07/07/2022. FINDINGS: Examination is limited due to patient rotation. The sacrum is not well evaluated on exam due to overlying bowel gas. No obvious acute fracture or dislocation. There is stable bony deformity of the inferior pubic ramus on the left, which may be due to old trauma. IMPRESSION: No acute fracture or dislocation. Electronically Signed   By: Brett Fairy M.D.   On: 09/16/2022 22:52   DG Chest Port 1 View  Result Date: 09/16/2022 CLINICAL DATA:  Level 2 trauma, fall. History of fracture and lower back for years ago. EXAM: PORTABLE CHEST 1 VIEW COMPARISON:  None Available. FINDINGS: The heart size and mediastinal contours are within normal limits. There is atherosclerotic calcification of the aorta. No consolidation, effusion, or pneumothorax. No acute osseous abnormality is seen. IMPRESSION: No active disease. Electronically Signed   By: Brett Fairy M.D.   On: 09/16/2022 22:46   DG Wrist Complete Right  Result Date: 09/16/2022 CLINICAL DATA:  Level 2 trauma, fall. EXAM: RIGHT WRIST - COMPLETE 3+ VIEW COMPARISON:  01/16/2021. FINDINGS: There is no evidence of fracture or dislocation. Mild degenerative changes are present at the first carpometacarpal joint and wrist. Vascular calcifications are noted in the soft tissues. There is chondrocalcinosis with calcification at the TFCC. IMPRESSION: No acute fracture or dislocation. Electronically Signed   By: Brett Fairy M.D.   On: 09/16/2022 22:45    Labs: BNP (last 3 results) Recent Labs     01/20/22 0458 01/21/22 0228 06/12/22 1545  BNP 943.3* 760.8* 466.5*   Basic Metabolic Panel: Recent Labs  Lab 09/27/22 0441 09/28/22 0650 09/29/22 0546 09/30/22 0556 10/01/22 0649  NA 136 138 136 140 141  K 4.5 3.9 3.7 3.7 3.5  CL 98 96* 94* 101  100  CO2 _0 GLUCOSE 94 91 91 101* 100*  BUN 41* _1 CREATININE 8.76* 4.50* 4.58* 6.55* 4.09*  CALCIUM 10.3 10.1 10.2 10.4* 9.9  PHOS 5.4* 3.2 3.5 3.9 2.8   Liver Function Tests: Recent Labs  Lab 09/27/22 0441 09/28/22 0650 09/29/22 0546 09/30/22 0556 10/01/22 0649  AST 17  --   --   --   --   ALT 8  --   --   --   --   ALKPHOS 53  --   --   --   --   BILITOT 0.6  --   --   --   --   PROT 6.4*  --   --   --   --   ALBUMIN 2.6*  2.7* 2.8* 2.7* 2.5* 2.5*   No results for input(s): "LIPASE", "AMYLASE" in the last 168 hours. No results for input(s): "AMMONIA" in the last 168 hours. CBC: Recent Labs  Lab 09/27/22 0441 09/28/22 0649 09/29/22 0546 09/30/22 0556 10/01/22 0649  WBC 3.9* 4.0 4.4 5.4 4.8  NEUTROABS 2.2 2.3 2.5 3.3 2.6  HGB 9.7* 10.6* 9.5* 8.3* 8.9*  HCT 28.1* 32.0* 27.6* 25.4* 26.2*  MCV 98.3 101.3* 98.9 101.2* 100.8*  PLT 263 239 219 216 221   Cardiac Enzymes: No results for input(s): "CKTOTAL", "CKMB", "CKMBINDEX", "TROPONINI" in the last 168 hours. BNP: Invalid input(s): "POCBNP" CBG: Recent Labs  Lab 09/30/22 0623  GLUCAP 103*   D-Dimer No results for input(s): "DDIMER" in the last 72 hours. Hgb A1c No results for input(s): "HGBA1C" in the last 72 hours. Lipid Profile No results for input(s): "CHOL", "HDL", "LDLCALC", "TRIG", "CHOLHDL", "LDLDIRECT" in the last 72 hours. Thyroid function studies No results for input(s): "TSH", "T4TOTAL", "T3FREE", "THYROIDAB" in the last 72 hours.  Invalid input(s): "FREET3" Anemia work up No results for input(s): "VITAMINB12", "FOLATE", "FERRITIN", "TIBC", "IRON", "RETICCTPCT" in the last 72 hours. Urinalysis    Component Value  Date/Time   COLORURINE YELLOW 09/17/2022 0137   APPEARANCEUR CLOUDY (A) 09/17/2022 0137   LABSPEC 1.008 09/17/2022 0137   PHURINE 9.0 (H) 09/17/2022 0137   GLUCOSEU NEGATIVE 09/17/2022 0137   HGBUR NEGATIVE 09/17/2022 0137   BILIRUBINUR NEGATIVE 09/17/2022 0137   KETONESUR NEGATIVE 09/17/2022 0137   PROTEINUR 100 (A) 09/17/2022 0137   UROBILINOGEN 0.2 09/27/2021 1329   NITRITE NEGATIVE 09/17/2022 [place] 09/17/2022 0137   Sepsis Labs Recent Labs  Lab 09/28/22 0649 09/29/22 0546 09/30/22 0556 10/01/22 0649  WBC 4.0 4.4 5.4 4.8   Microbiology No results found for this or any previous visit (from the past 240 hour(s)).   Time coordinating discharge: 35 minutes  SIGNED: Antonieta Pert, MD  [facility] 10/01/2022, 1:13 PM  If 7PM-7AM, please contact night-coverage [email or web address]

## 2022-10-01 NOTE — Progress Notes (Signed)
Physical Therapy Treatment Patient Details Name: Diamond [name] MRN: [id number] DOB: 25-Feb-1951 Today's Date: 10/01/2022   History of Present Illness The pt is a 71 yo female presenting 10/12 with AMS after missing x2 HD treatments and sustaining a mechanical fall at home. Pt found to have HTN emergency, and MRI showed progression of discitis/osteomyelitis at L2-3 and L3-4 with stable compression fx of L2, L3, L4, and L5. Neurosurgery recommends medical management, non-op. PMH includes: anemia, asthma, atrial flutter, ESRD on HD, gout, and HTN.    PT Comments    Pt with good progress towards acute PT goals this session. Pt able to come to sitting EOB with min-mod a, once sitting EOB pt with continued right lateral lean onto elbow, however pt able to correct with min assist and maintain. Pt able to come to standing with min assist of 2 for safety and step pivot transfers with up to mod assist of 2 x3 during session. Pt needing step by step cues to advance feet during tranfers as pt fatigue increasing and max cues for safety throughout as pt with tendency for premature sitting. Pt awake and alert throughout session, making conversation and pleasant. Pt agreeable to time up in chair at end of session. Pt continues to benefit from skilled PT services to progress toward functional mobility goals.    Recommendations for follow up therapy are one component of a multi-disciplinary discharge planning process, led by the attending physician.  Recommendations may be updated based on patient status, additional functional criteria and insurance authorization.  Follow Up Recommendations  Skilled nursing-short term rehab (<3 hours/day) Can patient physically be transported by private vehicle: No   Assistance Recommended at Discharge Frequent or constant Supervision/Assistance  Patient can return home with the following Two people to help with walking and/or transfers;Two people to help with  bathing/dressing/bathroom;Assistance with cooking/housework;Assistance with feeding;Direct supervision/assist for medications management;Direct supervision/assist for financial management;Assist for transportation;Help with stairs or ramp for entrance   Equipment Recommendations  None recommended by PT    Recommendations for Other Services       Precautions / Restrictions Precautions Precautions: Fall Precaution Comments: could not recall Required Braces or Orthoses: Spinal Brace Spinal Brace: Lumbar corset (not in room, given in august) Other Brace: Spinal precautions for comfort Restrictions Weight Bearing Restrictions: No     Mobility  Bed Mobility Overal bed mobility: Needs Assistance       Supine to sit: Min assist     General bed mobility comments: pt lying supine sideways in bed on arrival with head near rail, pt preference to come up to sitting vs log rolling despite instruction and education on benefits and safety, pt needing min a to riase trunk and mod a to scoot to EOB    Transfers Overall transfer level: Needs assistance Equipment used: Rolling walker (2 wheels) Transfers: Sit to/from Stand, Bed to chair/wheelchair/BSC Sit to Stand: +2 safety/equipment, Min assist, +2 physical assistance   Step pivot transfers: Mod assist, +2 physical assistance       General transfer comment: min a to come to stand, mod  to transfer x3 throughout session EOB>chair<>BSC, pt with tendency for premature sit need MAX cues to ramain standing until reaching sitting surface. pt needing step by step cues to advance feet, increased difficulty with fatigue    Ambulation/Gait                   Stairs  Wheelchair Mobility    Modified Rankin (Stroke Patients Only)       Balance Overall balance assessment: Needs assistance Sitting-balance support: Bilateral upper extremity supported, Feet supported Sitting balance-Leahy Scale: Poor Sitting balance -  Comments: Pt with R lean onto at start able to correct and maintain when sitting EOB, returned to right lateral lean once up in chair needing asssit to come to midline, pt unaware of lean. once asssited to midline pt able to maintain in chair Postural control: Right lateral lean Standing balance support: During functional activity Standing balance-Leahy Scale: Poor Standing balance comment: heavy reliance on BUE on RW                            Cognition Arousal/Alertness: Awake/alert Behavior During Therapy: Flat affect Overall Cognitive Status: Impaired/Different from baseline Area of Impairment: Attention, Memory, Following commands, Awareness, Problem solving                 Orientation Level: Time Current Attention Level: Sustained Memory: Decreased recall of precautions, Decreased short-term memory Following Commands: Follows one step commands inconsistently Safety/Judgement: Decreased awareness of safety, Decreased awareness of deficits Awareness: Intellectual Problem Solving: Slow processing, Requires verbal cues, Requires tactile cues, Decreased initiation, Difficulty sequencing General Comments: pt awake and alert throughout session, able to recall PTA and OT names at end of session        Exercises      General Comments        Pertinent Vitals/Pain Pain Assessment Pain Assessment: Faces Faces Pain Scale: Hurts little more Pain Location: low back with mobility Pain Descriptors / Indicators: Grimacing, Guarding Pain Intervention(s): Monitored during session, Limited activity within patient's tolerance, Repositioned    Home Living                          Prior Function            PT Goals (current goals can now be found in the care plan section) Acute Rehab PT Goals PT Goal Formulation: With patient/family Time For Goal Achievement: 10/03/22    Frequency    Min 2X/week      PT Plan Current plan remains appropriate     Co-evaluation PT/OT/SLP Co-Evaluation/Treatment: Yes Reason for Co-Treatment: Complexity of the patient's impairments (multi-system involvement);For patient/therapist safety PT goals addressed during session: Mobility/safety with mobility;Proper use of DME;Balance        AM-PAC PT "6 Clicks" Mobility   Outcome Measure  Help needed turning from your back to your side while in a flat bed without using bedrails?: A Lot Help needed moving from lying on your back to sitting on the side of a flat bed without using bedrails?: A Little Help needed moving to and from a bed to a chair (including a wheelchair)?: A Lot Help needed standing up from a chair using your arms (e.g., wheelchair or bedside chair)?: A Lot Help needed to walk in hospital room?: A Lot Help needed climbing 3-5 steps with a railing? : Total 6 Click Score: 12    End of Session Equipment Utilized During Treatment: Gait belt Activity Tolerance: Patient tolerated treatment well Patient left: in chair;with call bell/phone within reach;with chair alarm set Nurse Communication: Mobility status PT Visit Diagnosis: Unsteadiness on feet (R26.81);Other abnormalities of gait and mobility (R26.89);Muscle weakness (generalized) (M62.81);Pain Pain - part of body:  (back)     Time: 9233-0076 PT Time Calculation (min) (ACUTE  ONLY): 33 min  Charges:  $Therapeutic Activity: 8-22 mins                     Avanthika Dehnert R. PTA Acute Rehabilitation Services Office: [place] 10/01/2022, 11:08 AM

## 2022-10-01 NOTE — TOC Transition Note (Signed)
Transition of Care [facility]) - CM/SW Discharge Note   Patient Details  Name: Diamond [name] MRN: [id number] Date of Birth: 1951/03/30  [facility] CM/SW Contact:  Coralee Pesa, [place] Phone Number: 10/01/2022, 1:20 PM   Clinical Narrative:    Pt to be transported to [place] via [facility]. Nurse to call report to [place] # [phone]   Final next level of care: Skilled Nursing Facility Barriers to Discharge: Barriers Resolved   Patient Goals and CMS Choice Patient states their goals for this hospitalization and ongoing recovery are:: Pt is unable to participate in goal setting due to disorientation. CMS Medicare.gov Compare Post Acute Care list provided to:: Patient Represenative (must comment) ([name]) Choice offered to / list presented to : Adult Children  Discharge Placement              Patient chooses bed at: [facility] Patient to be transferred to facility by: [place] Name of family member notified: [name] Patient and family notified of of transfer: 10/01/22  Discharge Plan and Services     Post Acute Care Choice: [place]                               Social Determinants of Health (SDOH) Interventions     Readmission Risk Interventions    06/30/2022    1:12 PM 06/17/2022    1:02 PM 01/22/2022   12:19 PM  Readmission Risk Prevention Plan  Transportation Screening Complete Complete Complete  Medication Review Press photographer) Complete Complete Complete  PCP or Specialist appointment within 3-5 days of discharge Complete Complete Complete  HRI or Home Care Consult Complete Complete Complete  SW Recovery Care/Counseling Consult Complete Complete Complete  Palliative Care Screening Not Applicable Not Applicable Not [place] Not Applicable Complete Not Applicable

## 2022-10-01 NOTE — Progress Notes (Signed)
D/C order noted. Contacted [place] to advise clinic of pt's d/c today to [facility] and that pt will resume care tomorrow. Clinic also advised pt will require iv vancomycin with HD and [facility] will send orders at d/c.   Melven Sartorius Renal Navigator [phone]

## 2022-10-01 NOTE — Progress Notes (Signed)
Occupational Therapy Treatment Patient Details Name: Diamond [name] MRN: [id number] DOB: 08-Jul-1951 Today's Date: 10/01/2022   History of present illness The pt is a 71 yo female presenting 10/12 with AMS after missing x2 HD treatments and sustaining a mechanical fall at home. Pt found to have HTN emergency, and MRI showed progression of discitis/osteomyelitis at L2-3 and L3-4 with stable compression fx of L2, L3, L4, and L5. Neurosurgery recommends medical management, non-op. PMH includes: anemia, asthma, atrial flutter, ESRD on HD, gout, and HTN.   OT comments  Pt progressing towards established OT goals. Pt continues to be limited by decreased balance, problem solving, generalized weakness, memory, and activity tolerance. Pt performing 3 step pivot transfers this session with mod A +2. Pt required max verbal cues to avoid sitting prematurely with all transfers. Continuing to recommend SNF for continued OT services.    Recommendations for follow up therapy are one component of a multi-disciplinary discharge planning process, led by the attending physician.  Recommendations may be updated based on patient status, additional functional criteria and insurance authorization.    Follow Up Recommendations  Skilled nursing-short term rehab (<3 hours/day)    Assistance Recommended at Discharge Frequent or constant Supervision/Assistance  Patient can return home with the following  A lot of help with walking and/or transfers;A lot of help with bathing/dressing/bathroom;Assistance with cooking/housework;Direct supervision/assist for medications management;Help with stairs or ramp for entrance;Assist for transportation;Direct supervision/assist for financial management   Equipment Recommendations  Other (comment) (TBD next venue)    Recommendations for Other Services      Precautions / Restrictions Precautions Precautions: Fall Precaution Comments: could not recall Required Braces or Orthoses:  Spinal Brace Spinal Brace: Lumbar corset (not in room. Given in august) Other Brace: Spinal precautions for comfort Restrictions Weight Bearing Restrictions: No       Mobility Bed Mobility Overal bed mobility: Needs Assistance       Supine to sit: Min assist     General bed mobility comments: pt lying supine sideways in bed on arrival with head near rail, pt preference to come up to sitting vs log rolling despite instruction and education on benefits and safety, pt needing min a to riase trunk and mod a to scoot to EOB    Transfers Overall transfer level: Needs assistance Equipment used: Rolling walker (2 wheels) Transfers: Sit to/from Stand, Bed to chair/wheelchair/BSC Sit to Stand: +2 safety/equipment, Min assist, +2 physical assistance     Step pivot transfers: Mod assist, +2 physical assistance     General transfer comment: min a to come to stand, mod  to transfer x3 throughout session EOB>chair<>BSC, pt with tendency for premature sit need MAX cues to ramain standing until reaching sitting surface. pt needing step by step cues to advance feet, increased difficulty with fatigue     Balance Overall balance assessment: Needs assistance Sitting-balance support: Bilateral upper extremity supported, Feet supported Sitting balance-Leahy Scale: Poor Sitting balance - Comments: Pt with R lean onto at start able to correct and maintain when sitting EOB, returned to right lateral lean once up in chair needing asssit to come to midline, pt unaware of lean. once asssited to midline pt able to maintain in chair Postural control: Right lateral lean Standing balance support: During functional activity Standing balance-Leahy Scale: Poor Standing balance comment: heavy reliance on BUE on RW  ADL either performed or assessed with clinical judgement   ADL Overall ADL's : Needs assistance/impaired Eating/Feeding: Set up;Sitting Eating/Feeding Details  (indicate cue type and reason): Pt self feeding while sitting in recliner at end of session with LUE for just use of fork.                 Lower Body Dressing: Total assistance Lower Body Dressing Details (indicate cue type and reason): PT donning socks Toilet Transfer: Moderate assistance;+2 for physical assistance;Rolling walker (2 wheels);BSC/3in1;Stand-pivot Toilet Transfer Details (indicate cue type and reason): mod A +2 to BSC. Intermittently requiring assist for LE advancement. Max cues for safety and sequencing. Toileting- Clothing Manipulation and Hygiene: Total assistance;Sit to/from stand Toileting - Clothing Manipulation Details (indicate cue type and reason): reliant on PT to perform pericare     Functional mobility during ADLs: Moderate assistance;+2 for physical assistance General ADL Comments: Limited by confusion, generalized weakness, difficulty problem solving, safety, and memory    Extremity/Trunk Assessment Upper Extremity Assessment Upper Extremity Assessment: Generalized weakness RUE Deficits / Details: Decreased attention to R side throughout session. Requiring intermittent cues to hold onto RW with RUE or to move from arm of BSC to RW LUE Deficits / Details: Using for self feeding   Lower Extremity Assessment Lower Extremity Assessment: Defer to PT evaluation        Vision   Additional Comments: Pt able to locate food on tray, but potentially has visual inattention   Perception     Praxis      Cognition Arousal/Alertness: Awake/alert Behavior During Therapy: Flat affect Overall Cognitive Status: Impaired/Different from baseline Area of Impairment: Attention, Memory, Following commands, Awareness, Problem solving                 Orientation Level: Time Current Attention Level: Sustained Memory: Decreased recall of precautions, Decreased short-term memory Following Commands: Follows one step commands inconsistently Safety/Judgement:  Decreased awareness of safety, Decreased awareness of deficits Awareness: Intellectual Problem Solving: Slow processing, Requires verbal cues, Requires tactile cues, Decreased initiation, Difficulty sequencing General Comments: pt awake and alert throughout session, able to recall PTA and OT names at end of session with min indirect cues. Oriented to day of week, month, and year, but not date. Pt following one step commands with increased time. Cues for safety thoutghout. Occasionally attempting to sit without seat behind her.        Exercises      Shoulder Instructions       General Comments VSS, more conversational today    Pertinent Vitals/ Pain       Pain Assessment Pain Assessment: Faces Faces Pain Scale: Hurts little more Pain Location: low back with mobility Pain Descriptors / Indicators: Grimacing, Guarding Pain Intervention(s): Limited activity within patient's tolerance, Monitored during session, Repositioned  Home Living                                          Prior Functioning/Environment              Frequency  Min 2X/week        Progress Toward Goals  OT Goals(current goals can now be found in the care plan section)  Progress towards OT goals: Progressing toward goals  Acute Rehab OT Goals Patient Stated Goal: eat OT Goal Formulation: With patient Time For Goal Achievement: 10/03/22 Potential to Achieve Goals: Fair ADL Goals Pt  Will Perform Grooming: with set-up;sitting Pt Will Perform Upper Body Dressing: with min assist;sitting Pt Will Perform Lower Body Dressing: with mod assist;sit to/from stand Pt Will Transfer to Toilet: with mod assist;stand pivot transfer;bedside commode Additional ADL Goal #1: Pt will sit EOB 5+ minutes with supervision as precursor to ADL  Plan Discharge plan remains appropriate;Frequency remains appropriate    Co-evaluation    PT/OT/SLP Co-Evaluation/Treatment: Yes Reason for Co-Treatment:  Complexity of the patient's impairments (multi-system involvement);For patient/therapist safety;To address functional/ADL transfers PT goals addressed during session: Mobility/safety with mobility;Proper use of DME OT goals addressed during session: ADL's and self-care;Proper use of Adaptive equipment and DME      AM-PAC OT "6 Clicks" Daily Activity     Outcome Measure   Help from another person eating meals?: A Little Help from another person taking care of personal grooming?: A Lot Help from another person toileting, which includes using toliet, bedpan, or urinal?: A Lot Help from another person bathing (including washing, rinsing, drying)?: A Lot Help from another person to put on and taking off regular upper body clothing?: A Lot Help from another person to put on and taking off regular lower body clothing?: Total 6 Click Score: 12    End of Session Equipment Utilized During Treatment: Gait belt;Rolling walker (2 wheels)  OT Visit Diagnosis: Unsteadiness on feet (R26.81);Other abnormalities of gait and mobility (R26.89);Muscle weakness (generalized) (M62.81);Other symptoms and signs involving cognitive function   Activity Tolerance Patient tolerated treatment well   Patient Left in chair;with call bell/phone within reach;with chair alarm set   Nurse Communication Mobility status        Time: 3570-1779 OT Time Calculation (min): 33 min  Charges: OT General Charges $OT Visit: 1 Visit OT Treatments $Self Care/Home Management : 8-22 mins  Shanda Howells, OTR/L [facility] Acute Rehabilitation Office: [phone]   Lula Olszewski 10/01/2022, 12:50 PM

## 2022-10-01 NOTE — Progress Notes (Signed)
Nephrology Progress Note:  Subjective:   She had last HD on 10/26 with 1.3 kg UF.  RN was able to encourage her to stay on treatment and she did receive HD in a chair.  We redirected her from scratching at her arm near AVF.  Per charting after HD she was agitated and refusing to take meds by mouth and came back without an IV.   She states that HD is going "good".  She does still want to continue dialysis.   I gave her a bag of cookies on her tray that she hadn't seen and she gave her biggest smile I've seen so far.    Review of systems:  Denies shortness of breath or chest pain  Denies n/v   Objective Vital signs in last 24 hours: Vitals:   09/30/22 2120 09/30/22 2348 10/01/22 0356 10/01/22 0858  BP: (!) 149/94 (!) 114/90 (!) 151/92 (!) 182/99  Pulse: 72 71 73 68  Resp: '18 17 18 18  '$ Temp:  98.1 F (36.7 C) 99.3 F (37.4 C) 98.4 F (36.9 C)  TempSrc:   Oral Oral  SpO2:  100% 100% 97%  Weight:      Height:        Physical Exam:       General elderly female in bed  HEENT normocephalic atraumatic Neck supple trachea midline Lungs clear to auscultation bilaterally normal work of breathing at rest  Heart S1S2 no rub Abdomen soft nontender nondistended Extremities no edema  Psych no anxiety or agitation  Neuro more awake today and more interactive.  conversant   Access LUE AVF with bruit and thrill        OP HD: [place] TTS 4h   400/600  53.5kg  2/2 bath  Hep none LUA AVF - last HD 10/10 post wt 54.1kg - last Hb 10.1 - mircera 100 q2, new start  - iron sucrose 50 mg weekly - doxercalciferol 3 ug iv tts      109/100, HR 90,  RR 15  97%    K+ 5.4  BUN 42  cr 7.53   Problem/Plan Acute metabolic encephalopathy -multiple etiology MRSA bacteremia with vertebral discitis/osteomyelitis, dementia, gabapentin.   Had not really missed any HD sessions, No sig azotemia.  her baseline is with some cognitive deficits and sundowning -  is worse in this setting.  Has improved but still  ending treatments early.    2.   ESRD - on HD TTS.  Usually good compliance.  Here initially treatments complicated by Afib and hypotension and intolerance of UF;  now more behavioral issues. At this time signing off early which is frustrating but doesn't preclude continuing HD - HD per TTS schedule - first shift if possible.  She does still want to continue dialysis. - outpatient she will need vanc with HD as below - I have spoken with her son and he shares she has always talked about stopping HD "ever since starting" but she has kept at it because she knows she needs it to stay alive. Continue goals of care discussions given recent noncompliance - appreciate palliative care   HTN /vol - had been improved on current regimen; note sometimes refuses meds per charting Progression of discitis/osteomyelitis L-spine on MRI on 09/17/2022/= ID consulting noted [facility] 10/13 1 of 4 bottles GPC, MRSA per [facility] ID on vancomycin, TTE ordered by ID and neurosurgery to eval repeat blood cultures 10/15 ordered/NS consulted noted no surgery needed currently-  will need vanc 500  mg three times a week with HD until 12/11 per ID Afib.  Note hx of rapid A-fib IV Cardizem drip started by admit team 10/13 evening.  Per primary team  Anemia of esrd - On ESA weekly which is now at 200 mcg every tuesday. Fe IV weekly held in setting of infection MBD ckd - hypercalcemia, holding IV vdra. Cont renvela as binder.  more HD bath options for lower calcium as an outpatient  Disposition - per primary team.  She is not thriving but acceptable from a strictly renal standpoint.  Would continue goals of care discussions outpatient  Labs: Basic Metabolic Panel: Recent Labs  Lab 09/29/22 0546 09/30/22 0556 10/01/22 0649  NA 136 140 141  K 3.7 3.7 3.5  CL 94* 101 100  CO2 '29 27 27  '$ GLUCOSE 91 101* 100*  BUN '14 21 12  '$ CREATININE 4.58* 6.55* 4.09*  CALCIUM 10.2 10.4* 9.9  PHOS 3.5 3.9 2.8   Liver Function Tests: Recent Labs  Lab  09/27/22 0441 09/28/22 0650 09/29/22 0546 09/30/22 0556 10/01/22 0649  AST 17  --   --   --   --   ALT 8  --   --   --   --   ALKPHOS 53  --   --   --   --   BILITOT 0.6  --   --   --   --   PROT 6.4*  --   --   --   --   ALBUMIN 2.6*  2.7*   < > 2.7* 2.5* 2.5*   < > = values in this interval not displayed.   No results for input(s): "LIPASE", "AMYLASE" in the last 168 hours. No results for input(s): "AMMONIA" in the last 168 hours.  CBC: Recent Labs  Lab 09/27/22 0441 09/28/22 0649 09/29/22 0546 09/30/22 0556 10/01/22 0649  WBC 3.9* 4.0 4.4 5.4 4.8  NEUTROABS 2.2 2.3 2.5 3.3 2.6  HGB 9.7* 10.6* 9.5* 8.3* 8.9*  HCT 28.1* 32.0* 27.6* 25.4* 26.2*  MCV 98.3 101.3* 98.9 101.2* 100.8*  PLT 263 239 219 216 221   Cardiac Enzymes: No results for input(s): "CKTOTAL", "CKMB", "CKMBINDEX", "TROPONINI" in the last 168 hours. CBG: Recent Labs  Lab 09/30/22 0623  GLUCAP 103*       ceFAZolin (ANCEF) IV 1 g (09/30/22 2258)   vancomycin Stopped (09/30/22 1900)    apixaban  2.5 mg Oral BID   Chlorhexidine Gluconate Cloth  6 each Topical Q0600   [START ON 10/05/2022] darbepoetin (ARANESP) injection - DIALYSIS  200 mcg Subcutaneous Q Tue-1800   diltiazem  120 mg Oral Daily   feeding supplement  1 Container Oral TID WC   hydrALAZINE  100 mg Oral 3 times per day on Sun Mon Wed Fri   hydrALAZINE  100 mg Oral 2 times per day on Tue Thu Sat   hydrocortisone cream   Topical TID   losartan  25 mg Oral Daily   metoprolol tartrate  50 mg Oral BID   mometasone-formoterol  2 puff Inhalation BID   multivitamin  1 tablet Oral QHS   pantoprazole  40 mg Oral BID   sevelamer carbonate  1,600 mg Oral TID WC     Claudia Desanctis, MD 10/01/2022 9:16 AM

## 2022-10-01 NOTE — TOC Progression Note (Signed)
Transition of Care [facility]) - Progression Note    Patient Details  Name: Diamond [name] MRN: [id number] Date of Birth: February 04, 1951  [facility] CM/SW Contact  Coralee P[place], [place] Phone Number: 10/01/2022, 10:30 AM  Clinical Narrative:    CSW notified that Ever[facility] Balls is offering a Peer To Peer, deadline is today 10/27 2:30pm,  number to call is [phone] opt [phone], member id is [id number]. CSW notified MD. TOC will continue to follow.   Expected Discharge Plan: [place] Barriers to Discharge: Continued Medical Work up, Ship broker  Expected Discharge Plan and Services Expected Discharge Plan: [place] Choice: [place] Living arrangements for the past 2 months: Single Family Home                                       Social Determinants of Health (SDOH) Interventions    Readmission Risk Interventions    06/30/2022    1:12 PM 06/17/2022    1:02 PM 01/22/2022   12:19 PM  Readmission Risk Prevention Plan  Transportation Screening Complete Complete Complete  Medication Review Press photographer) Complete Complete Complete  PCP or Specialist appointment within 3-5 days of discharge Complete Complete Complete  HRI or Home Care Consult Complete Complete Complete  SW Recovery Care/Counseling Consult Complete Complete Complete  Palliative Care Screening Not Applicable Not Applicable Not [place] Not Applicable Complete Not Applicable

## 2022-10-02 DIAGNOSIS — M2578 Osteophyte, vertebrae: Secondary | ICD-10-CM | POA: Diagnosis not present

## 2022-10-02 DIAGNOSIS — I318 Other specified diseases of pericardium: Secondary | ICD-10-CM | POA: Diagnosis not present

## 2022-10-02 DIAGNOSIS — I499 Cardiac arrhythmia, unspecified: Secondary | ICD-10-CM | POA: Diagnosis not present

## 2022-10-02 DIAGNOSIS — D696 Thrombocytopenia, unspecified: Secondary | ICD-10-CM | POA: Diagnosis not present

## 2022-10-02 DIAGNOSIS — S199XXA Unspecified injury of neck, initial encounter: Secondary | ICD-10-CM | POA: Diagnosis not present

## 2022-10-02 DIAGNOSIS — Z7901 Long term (current) use of anticoagulants: Secondary | ICD-10-CM | POA: Diagnosis not present

## 2022-10-02 DIAGNOSIS — S0990XA Unspecified injury of head, initial encounter: Secondary | ICD-10-CM | POA: Diagnosis not present

## 2022-10-02 DIAGNOSIS — M109 Gout, unspecified: Secondary | ICD-10-CM | POA: Diagnosis not present

## 2022-10-02 DIAGNOSIS — Z992 Dependence on renal dialysis: Secondary | ICD-10-CM | POA: Diagnosis not present

## 2022-10-02 DIAGNOSIS — Z79899 Other long term (current) drug therapy: Secondary | ICD-10-CM | POA: Diagnosis not present

## 2022-10-02 DIAGNOSIS — E041 Nontoxic single thyroid nodule: Secondary | ICD-10-CM | POA: Diagnosis not present

## 2022-10-02 DIAGNOSIS — I739 Peripheral vascular disease, unspecified: Secondary | ICD-10-CM | POA: Diagnosis not present

## 2022-10-02 DIAGNOSIS — G319 Degenerative disease of nervous system, unspecified: Secondary | ICD-10-CM | POA: Diagnosis not present

## 2022-10-02 DIAGNOSIS — M2550 Pain in unspecified joint: Secondary | ICD-10-CM | POA: Diagnosis not present

## 2022-10-02 DIAGNOSIS — I480 Paroxysmal atrial fibrillation: Secondary | ICD-10-CM | POA: Diagnosis not present

## 2022-10-02 DIAGNOSIS — D631 Anemia in chronic kidney disease: Secondary | ICD-10-CM | POA: Diagnosis not present

## 2022-10-02 DIAGNOSIS — J45998 Other asthma: Secondary | ICD-10-CM | POA: Diagnosis not present

## 2022-10-02 DIAGNOSIS — Z7401 Bed confinement status: Secondary | ICD-10-CM | POA: Diagnosis not present

## 2022-10-02 DIAGNOSIS — A4102 Sepsis due to Methicillin resistant Staphylococcus aureus: Secondary | ICD-10-CM | POA: Diagnosis not present

## 2022-10-02 DIAGNOSIS — R4182 Altered mental status, unspecified: Secondary | ICD-10-CM | POA: Diagnosis not present

## 2022-10-02 DIAGNOSIS — I10 Essential (primary) hypertension: Secondary | ICD-10-CM | POA: Diagnosis not present

## 2022-10-02 DIAGNOSIS — W06XXXA Fall from bed, initial encounter: Secondary | ICD-10-CM | POA: Diagnosis not present

## 2022-10-02 DIAGNOSIS — Y92003 Bedroom of unspecified non-institutional (private) residence as the place of occurrence of the external cause: Secondary | ICD-10-CM | POA: Diagnosis not present

## 2022-10-02 DIAGNOSIS — N2581 Secondary hyperparathyroidism of renal origin: Secondary | ICD-10-CM | POA: Diagnosis not present

## 2022-10-02 DIAGNOSIS — W19XXXA Unspecified fall, initial encounter: Secondary | ICD-10-CM | POA: Diagnosis not present

## 2022-10-02 DIAGNOSIS — M50322 Other cervical disc degeneration at C5-C6 level: Secondary | ICD-10-CM | POA: Diagnosis not present

## 2022-10-02 DIAGNOSIS — R921 Mammographic calcification found on diagnostic imaging of breast: Secondary | ICD-10-CM | POA: Diagnosis not present

## 2022-10-02 DIAGNOSIS — R55 Syncope and collapse: Secondary | ICD-10-CM | POA: Diagnosis not present

## 2022-10-02 DIAGNOSIS — R627 Adult failure to thrive: Secondary | ICD-10-CM | POA: Diagnosis not present

## 2022-10-02 DIAGNOSIS — N250 Renal osteodystrophy: Secondary | ICD-10-CM | POA: Diagnosis not present

## 2022-10-02 DIAGNOSIS — N186 End stage renal disease: Secondary | ICD-10-CM | POA: Diagnosis not present

## 2022-10-02 DIAGNOSIS — S299XXA Unspecified injury of thorax, initial encounter: Secondary | ICD-10-CM | POA: Diagnosis not present

## 2022-10-02 DIAGNOSIS — I120 Hypertensive chronic kidney disease with stage 5 chronic kidney disease or end stage renal disease: Secondary | ICD-10-CM | POA: Diagnosis not present

## 2022-10-02 LAB — VITAMIN B1: Vitamin B1 (Thiamine): 72.5 nmol/L (ref 66.5–200.0)

## 2022-10-02 NOTE — Progress Notes (Signed)
Pt left via stretcher with PTAR to facility.  All belongings sent with pt.  Pt notified her daughter earlier in the shift that she was waiting for transport.  Pt to keep right forearm IV in place due to difficult access as pt will need IV antibiotics at facility.  Pt in no distress upon departure.    10/01/22 2319  Vitals  Temp 98.9 F (37.2 C)  Temp Source Oral  BP (!) 162/94  MAP (mmHg) 114  BP Location Right Arm  BP Method Automatic  Patient Position (if appropriate) Lying  Pulse Rate 71  Pulse Rate Source Monitor  Resp 16  MEWS COLOR  MEWS Score Color Green  Oxygen Therapy  SpO2 99 %  O2 Device Room Air   Ayesha Mohair BSN RN [facility] 10/02/2022, 12:47 AM

## 2022-10-04 ENCOUNTER — Emergency Department (HOSPITAL_COMMUNITY): Payer: Medicare HMO

## 2022-10-04 ENCOUNTER — Encounter (HOSPITAL_COMMUNITY): Payer: Self-pay

## 2022-10-04 ENCOUNTER — Emergency Department (HOSPITAL_COMMUNITY)
Admission: EM | Admit: 2022-10-04 | Discharge: 2022-10-06 | Disposition: A | Discharge status: Home / Self Care | Payer: Medicare HMO | Attending: Emergency Medicine | Admitting: Emergency Medicine

## 2022-10-04 DIAGNOSIS — G319 Degenerative disease of nervous system, unspecified: Secondary | ICD-10-CM | POA: Diagnosis not present

## 2022-10-04 DIAGNOSIS — I120 Hypertensive chronic kidney disease with stage 5 chronic kidney disease or end stage renal disease: Secondary | ICD-10-CM | POA: Diagnosis not present

## 2022-10-04 DIAGNOSIS — S299XXA Unspecified injury of thorax, initial encounter: Secondary | ICD-10-CM | POA: Diagnosis not present

## 2022-10-04 DIAGNOSIS — E041 Nontoxic single thyroid nodule: Secondary | ICD-10-CM | POA: Diagnosis not present

## 2022-10-04 DIAGNOSIS — S0990XA Unspecified injury of head, initial encounter: Secondary | ICD-10-CM | POA: Insufficient documentation

## 2022-10-04 DIAGNOSIS — Z992 Dependence on renal dialysis: Secondary | ICD-10-CM | POA: Diagnosis not present

## 2022-10-04 DIAGNOSIS — Z7901 Long term (current) use of anticoagulants: Secondary | ICD-10-CM | POA: Insufficient documentation

## 2022-10-04 DIAGNOSIS — W19XXXA Unspecified fall, initial encounter: Secondary | ICD-10-CM | POA: Diagnosis not present

## 2022-10-04 DIAGNOSIS — R627 Adult failure to thrive: Secondary | ICD-10-CM | POA: Diagnosis not present

## 2022-10-04 DIAGNOSIS — N186 End stage renal disease: Secondary | ICD-10-CM | POA: Diagnosis not present

## 2022-10-04 DIAGNOSIS — I739 Peripheral vascular disease, unspecified: Secondary | ICD-10-CM | POA: Diagnosis not present

## 2022-10-04 DIAGNOSIS — W06XXXA Fall from bed, initial encounter: Secondary | ICD-10-CM | POA: Diagnosis not present

## 2022-10-04 DIAGNOSIS — M2578 Osteophyte, vertebrae: Secondary | ICD-10-CM | POA: Diagnosis not present

## 2022-10-04 DIAGNOSIS — R921 Mammographic calcification found on diagnostic imaging of breast: Secondary | ICD-10-CM | POA: Diagnosis not present

## 2022-10-04 DIAGNOSIS — Y92003 Bedroom of unspecified non-institutional (private) residence as the place of occurrence of the external cause: Secondary | ICD-10-CM | POA: Insufficient documentation

## 2022-10-04 DIAGNOSIS — M50322 Other cervical disc degeneration at C5-C6 level: Secondary | ICD-10-CM | POA: Diagnosis not present

## 2022-10-04 DIAGNOSIS — D696 Thrombocytopenia, unspecified: Secondary | ICD-10-CM | POA: Diagnosis not present

## 2022-10-04 DIAGNOSIS — Z79899 Other long term (current) drug therapy: Secondary | ICD-10-CM | POA: Insufficient documentation

## 2022-10-04 DIAGNOSIS — S199XXA Unspecified injury of neck, initial encounter: Secondary | ICD-10-CM | POA: Diagnosis not present

## 2022-10-04 DIAGNOSIS — D631 Anemia in chronic kidney disease: Secondary | ICD-10-CM | POA: Diagnosis not present

## 2022-10-04 DIAGNOSIS — N250 Renal osteodystrophy: Secondary | ICD-10-CM | POA: Diagnosis not present

## 2022-10-04 DIAGNOSIS — R4182 Altered mental status, unspecified: Secondary | ICD-10-CM | POA: Diagnosis not present

## 2022-10-04 NOTE — ED Triage Notes (Addendum)
Pt arrived by EMS after sliding out of her bed on rehab facility. Pt states that she remembers falling out of bed and doesn't think that she hit her head.  Pt was unable to get herself back into bed and was potential on the ground for about 1hr.   Pt states the she took her night time medications prior to arrival.  Pt denies pain at this time

## 2022-10-04 NOTE — ED Provider Notes (Signed)
[facility] EMERGENCY DEPARTMENT Provider Note   CSN: [id number] Arrival date & time: 10/04/22  2232     History {Add pertinent medical, surgical, social history, OB history to HPI:1} Chief Complaint  Patient presents with   Diamond [name] is a 71 y.o. female.  The history is provided by the patient.  Patient with extensive medical history presents after possible fall from bed. Patient reports she found her self on the floor and was on the floor for about an hour.  It is unclear if she fell or rolled out. She has no new complaints.      Home Medications Prior to Admission medications   Medication Sig Start Date End Date Taking? Authorizing Provider  acetaminophen (TYLENOL) 325 MG tablet Take 2 tablets (650 mg total) by mouth 4 (four) times daily. Patient taking differently: Take 650 mg by mouth every 6 (six) hours as needed for moderate pain. 06/30/22   Elgergawy, Silver Huguenin, MD  albuterol (VENTOLIN HFA) 108 (90 Base) MCG/ACT inhaler Inhale 2 puffs into the lungs every 4 (four) hours as needed for wheezing or shortness of breath. 10/08/19   [provider]  apixaban (ELIQUIS) 2.5 MG TABS tablet Take 1 tablet (2.5 mg total) by mouth 2 (two) times daily. 02/06/22   Ghimire, Henreitta Leber, MD  fluticasone-salmeterol (ADVAIR) 250-50 MCG/ACT AEPB Inhale 1 puff into the lungs daily as needed (for shortness of breath). 02/19/22   [provider]  gabapentin (NEURONTIN) 100 MG capsule Take 1 capsule (100 mg total) by mouth at bedtime. 09/15/22 10/15/22  Vu, Johnny Bridge T, MD  hydrALAZINE (APRESOLINE) 100 MG tablet Take 100 mg by mouth See admin instructions. Take 1 tablet by mouth twice a day on dialysis days, then take 1 tablet three times a day on non dialysis days 09/14/22   [provider]  losartan (COZAAR) 25 MG tablet Take 1 tablet (25 mg total) by mouth daily. 10/02/22   Antonieta Pert, MD  metoprolol tartrate (LOPRESSOR) 50 MG tablet Take 1 tablet  (50 mg total) by mouth 2 (two) times daily. 12/30/21   Ghimire, Henreitta Leber, MD  pantoprazole (PROTONIX) 40 MG tablet Take 1 tablet (40 mg total) by mouth 2 (two) times daily. 01/22/22   Thurnell Lose, MD  sevelamer carbonate (RENVELA) 800 MG tablet Take 2 tablets (1,600 mg total) by mouth 3 (three) times daily with meals. 12/30/21   Ghimire, Henreitta Leber, MD  vancomycin IVPB Inject 500 mg into the vein Every Tuesday,Thursday,and Saturday with dialysis. Indication:  MRSA discitis/osteomyelitis  First Dose: Yes Last Day of Therapy:  11/15/22 Labs - Sunday/Monday:  CBC/D, BMP, and vancomycin trough. Labs - Thursday:  BMP and vancomycin trough Labs - Every other week:  ESR and CRP Method of administration:Elastomeric Method of administration may be changed at the discretion of the patient and/or caregiver's ability to self-administer the medication ordered. 10/02/22   Antonieta Pert, MD      Allergies    Shellfish allergy and Other    Review of Systems   Review of Systems  Constitutional:  Negative for fever.    Physical Exam Updated Vital Signs BP 139/89 (BP Location: Right Arm)   Pulse 72   Temp 98.1 F (36.7 C) (Oral)   Resp 16   SpO2 99%  Physical Exam CONSTITUTIONAL: Elderly, chronically ill-appearing, no acute distress HEAD: Normocephalic/atraumatic, no visible trauma ENMT: Mucous membranes moist NECK: supple no meningeal signs SPINE/BACK:entire spine nontender No bruising/crepitance/stepoffs noted to  spine CV: S1/S2 noted, no murmurs/rubs/gallops noted LUNGS: Lungs are clear to auscultation bilaterally, no apparent distress ABDOMEN: soft, nontender NEURO: Pt is resting with eyes closed, but easily arousable and answers questions appropriately.  Moves all extremities x4 EXTREMITIES: pulses normal/equal, full ROM Dialysis access to left arm with thrill noted All other extremities/joints palpated/ranged and nontender SKIN: warm, color normal   ED Results / Procedures /  Treatments   Labs (all labs ordered are listed, but only abnormal results are displayed) Labs Reviewed  CBC  LACTIC ACID, PLASMA  PROTIME-INR  BASIC METABOLIC PANEL  CK    EKG None  Radiology No results found.  Procedures Procedures  {Document cardiac monitor, telemetry assessment procedure when appropriate:1}  Medications Ordered in ED Medications - No data to display  ED Course/ Medical Decision Making/ A&P Clinical Course as of 10/04/22 2347  Mon Oct 04, 2022  2342 Discussed with nursing facility.  Patient was found on the floor, it is unclear if she fell or rolled out of bed.  No other acute concerns [DW]    Clinical Course User Index [DW] Ripley Fraise, MD                           Medical Decision Making Amount and/or Complexity of Data Reviewed Labs: ordered. Radiology: ordered. ECG/medicine tests: ordered.   This patient presents to the ED for concern of fall/trauma, this involves an extensive number of treatment options, and is a complaint that carries with it a high risk of complications and morbidity.  The differential diagnosis includes but is not limited to subdural hematoma, intracranial hemorrhage, skull fracture, cervical spine fracture, blunt chest trauma  Comorbidities that complicate the patient evaluation: Patient's presentation is complicated by their history of stage renal disease, paroxysmal atrial fibrillation on Eliquis  Social Determinants of Health: Patient's  nursing home residence   increases the complexity of managing their presentation  Additional history obtained: Additional history obtained from nursing home/care facility Records reviewed previous admission documents  Lab Tests: I Ordered, and personally interpreted labs.  The pertinent results include:  ***  Imaging Studies ordered: I ordered imaging studies including CT scan head and C-spine and X-ray chest   I independently visualized and interpreted imaging which showed  *** I agree with the radiologist interpretation  Cardiac Monitoring: The patient was maintained on a cardiac monitor.  I personally viewed and interpreted the cardiac monitor which showed an underlying rhythm of:  {cardiac monitor:26849}  Medicines ordered and prescription drug management: I ordered medication including ***  for ***  Reevaluation of the patient after these medicines showed that the patient    {resolved/improved/worsened:23923::"improved"}  Test Considered: Patient is low risk / negative by ***, therefore do not feel that *** is indicated.  Critical Interventions:  ***  Consultations Obtained: I requested consultation with the {consultation:26851}, and discussed  findings as well as pertinent plan - they recommend: ***  Reevaluation: After the interventions noted above, I reevaluated the patient and found that they have :{resolved/improved/worsened:23923::"improved"}  Complexity of problems addressed: Patient's presentation is most consistent with  {FAOZ:30865}  Disposition: After consideration of the diagnostic results and the patient's response to treatment,  I feel that the patent would benefit from {disposition:26850}.     {Document critical care time when appropriate:1} {Document review of labs and clinical decision tools ie heart score, Chads2Vasc2 etc:1}  {Document your independent review of radiology images, and any outside records:1} {Document your discussion with  family members, caretakers, and with consultants:1} {Document social determinants of health affecting pt's care:1} {Document your decision making why or why not admission, treatments were needed:1} Final Clinical Impression(s) / ED Diagnoses Final diagnoses:  None    Rx / DC Orders ED Discharge Orders     None

## 2022-10-04 NOTE — ED Notes (Signed)
Pt able to move all extremities and CMS intact

## 2022-10-05 ENCOUNTER — Emergency Department (HOSPITAL_COMMUNITY): Payer: Medicare HMO

## 2022-10-05 DIAGNOSIS — N186 End stage renal disease: Secondary | ICD-10-CM | POA: Diagnosis not present

## 2022-10-05 DIAGNOSIS — I120 Hypertensive chronic kidney disease with stage 5 chronic kidney disease or end stage renal disease: Secondary | ICD-10-CM | POA: Diagnosis not present

## 2022-10-05 DIAGNOSIS — Z992 Dependence on renal dialysis: Secondary | ICD-10-CM | POA: Diagnosis not present

## 2022-10-05 LAB — BASIC METABOLIC PANEL
Anion gap: 18 — ABNORMAL HIGH (ref 5–15)
BUN: 34 mg/dL — ABNORMAL HIGH (ref 8–23)
CO2: 28 mmol/L (ref 22–32)
Calcium: 11 mg/dL — ABNORMAL HIGH (ref 8.9–10.3)
Chloride: 97 mmol/L — ABNORMAL LOW (ref 98–111)
Creatinine, Ser: 7.36 mg/dL — ABNORMAL HIGH (ref 0.44–1.00)
GFR, Estimated: 5 mL/min — ABNORMAL LOW (ref >60)
Glucose, Bld: 112 mg/dL — ABNORMAL HIGH (ref 70–99)
Potassium: 4.1 mmol/L (ref 3.5–5.1)
Sodium: 143 mmol/L (ref 135–145)

## 2022-10-05 LAB — CBC
HCT: 33.8 % — ABNORMAL LOW (ref 36.0–46.0)
Hemoglobin: 11.2 g/dL — ABNORMAL LOW (ref 12.0–15.0)
MCH: 35.7 pg — ABNORMAL HIGH (ref 26.0–34.0)
MCHC: 33.1 g/dL (ref 30.0–36.0)
MCV: 107.6 fL — ABNORMAL HIGH (ref 80.0–100.0)
Platelets: 106 10*3/uL — ABNORMAL LOW (ref 150–400)
RBC: 3.14 MIL/uL — ABNORMAL LOW (ref 3.87–5.11)
RDW: 21 % — ABNORMAL HIGH (ref 11.5–15.5)
WBC: 3.8 10*3/uL — ABNORMAL LOW (ref 4.0–10.5)
nRBC: 0 % (ref 0.0–0.2)

## 2022-10-05 LAB — PROTIME-INR
INR: 1 (ref 0.8–1.2)
Prothrombin Time: 12.8 seconds (ref 11.4–15.2)

## 2022-10-05 LAB — CK: Total CK: 27 U/L — ABNORMAL LOW (ref 38–234)

## 2022-10-05 LAB — LACTIC ACID, PLASMA: Lactic Acid, Venous: 2.4 mmol/L (ref 0.5–1.9)

## 2022-10-05 MED ORDER — VANCOMYCIN HCL 500 MG/100ML IV SOLN
500.0000 mg | INTRAVENOUS | Status: DC
  Administered 2022-10-06: 500 mg via INTRAVENOUS
  Filled 2022-10-05 (×2): qty 100

## 2022-10-05 MED ORDER — METOPROLOL TARTRATE 25 MG PO TABS
50.0000 mg | ORAL_TABLET | Freq: Two times a day (BID) | ORAL | Status: DC
  Administered 2022-10-05 (×3): 50 mg via ORAL
  Filled 2022-10-05 (×3): qty 2

## 2022-10-05 MED ORDER — ACETAMINOPHEN 325 MG PO TABS: 650.0000 mg | ORAL_TABLET | ORAL | Status: DC | PRN

## 2022-10-05 MED ORDER — CHLORHEXIDINE GLUCONATE CLOTH 2 % EX PADS: 6.0000 | MEDICATED_PAD | Freq: Every day | CUTANEOUS | Status: DC

## 2022-10-05 MED ORDER — APIXABAN 2.5 MG PO TABS
2.5000 mg | ORAL_TABLET | Freq: Two times a day (BID) | ORAL | Status: DC
  Administered 2022-10-05 (×2): 2.5 mg via ORAL
  Filled 2022-10-05 (×2): qty 1

## 2022-10-05 MED ORDER — LOSARTAN POTASSIUM 50 MG PO TABS
25.0000 mg | ORAL_TABLET | Freq: Every day | ORAL | Status: DC
  Administered 2022-10-05: 25 mg via ORAL
  Filled 2022-10-05: qty 1

## 2022-10-05 MED ORDER — GABAPENTIN 100 MG PO CAPS
100.0000 mg | ORAL_CAPSULE | Freq: Every day | ORAL | Status: DC
  Administered 2022-10-05: 100 mg via ORAL
  Filled 2022-10-05: qty 1

## 2022-10-05 MED ORDER — HYDRALAZINE HCL 25 MG PO TABS
100.0000 mg | ORAL_TABLET | ORAL | Status: DC
  Administered 2022-10-06: 100 mg via ORAL
  Filled 2022-10-05: qty 4

## 2022-10-05 MED ORDER — ALBUTEROL SULFATE (2.5 MG/3ML) 0.083% IN NEBU: 2.5000 mg | INHALATION_SOLUTION | RESPIRATORY_TRACT | Status: DC | PRN

## 2022-10-05 MED ORDER — ALBUTEROL SULFATE HFA 108 (90 BASE) MCG/ACT IN AERS: 2.0000 | INHALATION_SPRAY | RESPIRATORY_TRACT | Status: DC | PRN

## 2022-10-05 MED ORDER — HYDRALAZINE HCL 50 MG PO TABS
100.0000 mg | ORAL_TABLET | ORAL | Status: DC
  Administered 2022-10-05 (×2): 100 mg via ORAL
  Filled 2022-10-05 (×2): qty 2

## 2022-10-05 MED ORDER — SEVELAMER CARBONATE 800 MG PO TABS
1600.0000 mg | ORAL_TABLET | Freq: Three times a day (TID) | ORAL | Status: DC
  Administered 2022-10-05 – 2022-10-06 (×3): 1600 mg via ORAL
  Filled 2022-10-05 (×3): qty 2

## 2022-10-05 NOTE — Progress Notes (Signed)
Asked to see for dialysis. Sent from SNF for a fall. Family wants to take her home w/ home health.   Exam: General elderly female in bed  Lungs clear to auscultation bilaterally Heart S1S2 no rub Abdomen soft nontender nondistended Extremities no edema  Neuro awake today and interactive Access LUE AVF with bruit and thrill     OP HD: [facility] TTS 4h   400/600  2/2 bath  Hep none LUA AVF - needs IV vanc '500mg'$  tiw through 12/11    Today's labs (10/30) - Na 143  K 4.1 CO2 28  BUN 34  creat 7.3  Hb 11.2   CXR 10/30 - FINDINGS: Stable heart size and mediastinal contours. No pneumothorax, large pleural effusion or focal airspace disease. On limited assessment, no acute osseous findings. Coarse left breast calcification  Assessment/ Plan Fall - sent from SNF to ED.  AMS - dementia, meds, infection Recent MRSA bacteremia with vertebral discitis/osteomyelitis. IV vanc '500mg'$  post HD tiw through 12/11.  2.   ESRD - on HD TTS. Was good compliance but lately more behavioral issues. Will plan on HD today, but given current census may not be done until tonight or tomorrow.  Afib hx of RVR Anemia of esrd - gets esa weekly, will get records MBD ckd  Kelly Splinter, MD 10/05/2022, 1:03 PM  Recent Labs  Lab 09/30/22 0556 10/01/22 0649 10/05/22 0042  HGB 8.3* 8.9* 11.2*  ALBUMIN 2.5* 2.5*  --   CALCIUM 10.4* 9.9 11.0*  PHOS 3.9 2.8  --   CREATININE 6.55* 4.09* 7.36*  K 3.7 3.5 4.1    Inpatient medications:  apixaban  2.5 mg Oral BID   [START ON 10/06/2022] Chlorhexidine Gluconate Cloth  6 each Topical Q0600   gabapentin  100 mg Oral QHS   hydrALAZINE  100 mg Oral 2 times per day on Tue Thu Sat   [START ON 10/06/2022] hydrALAZINE  100 mg Oral 3 times per day on Sun Mon Wed Fri   losartan  25 mg Oral Daily   metoprolol tartrate  50 mg Oral BID   sevelamer carbonate  1,600 mg Oral TID WC    vancomycin     acetaminophen, albuterol

## 2022-10-05 NOTE — Progress Notes (Signed)
CSW spoke with patient's daughter [name] who states she does not want her mother discharged back to [facility] after last night's incident. [name] states she wants her mother to return home with home health.  RN CM confirmed patient is active with [facility] for PT, OT, RN, and SW.  [name], MSW, LCSW Transitions of Care  Clinical Social Worker II [phone]

## 2022-10-05 NOTE — Progress Notes (Signed)
IVT consulted for difficult stick.  Pt has med to be administered in dialysis.  Dr. Pearline Cables in agreement pt does not currently need a PIV. Advised RN, [place].  Will place new consult if IV access need arises.

## 2022-10-05 NOTE — ED Provider Notes (Signed)
Received in handoff from prior EDP at 0700 Pt from facility for possible fall Family concerned not being well cared for, at this time they would like to take her home with home health See SW/TOC documentation Will order DME She is pending HD this AM Hopefully can send home after HD is complete if able to get DME delivered Spoke with Jalayah, [place] [phone]   in regards to plan  Pt pending HD at shift change, DME was ordered for pt. Continue plan for hopeful discharge home with family/home health after completion of HD. Signed out to incoming EDP at shift change. HDS.      Jeanell Sparrow, DO 10/06/22 2334

## 2022-10-05 NOTE — Progress Notes (Signed)
Visited with pt. Provided emotional and spiritual support.  Assisted pt with calling her son. Chaplain available as needed.  Jaclynn Major, [place], [facility], Pager [phone]

## 2022-10-05 NOTE — Discharge Planning (Signed)
    Durable Medical Equipment  (From admission, onward)           Start     Ordered   10/05/22 1129  For home use only DME Hospital bed  Once       Question Answer Comment  Length of Need Lifetime   Patient has (list medical condition): dialysis, difficulty with ambulation   The above medical condition requires: Patient requires the ability to reposition frequently   Bed type Semi-electric   Hoyer Lift Yes   Trapeze Bar Yes   Support Surface: Gel Overlay      10/05/22 1128   10/05/22 1128  For home use only DME Walker  Once       Question:  Patient needs a walker to treat with the following condition  Answer:  Difficulty with activities of daily living   10/05/22 1127   10/05/22 1128  For home use only DME Bedside commode  Once       Question:  Patient needs a bedside commode to treat with the following condition  Answer:  Difficulty with activities of daily living   10/05/22 1128

## 2022-10-05 NOTE — Progress Notes (Signed)
Pharmacy Antibiotic Note  Diamond [name] is a 71 y.o. female admitted on 10/04/2022 with fall.  Pharmacy has been consulted for vancomycin dosing. Patient is on vancomycin '500mg'$  with [facility]HD TTS prior to admission for MRSA bacteremia, osteomyelitis/lumbar discitis/ventral epidural abscess planned through 11/15/22.   Plan: Continue vancomycin '500mg'$  with iHD Follow up iHD schedule - plan for today Vancomycin levels as appropriate      Temp (24hrs), Avg:98.1 F (36.7 C), Min:98.1 F (36.7 C), Max:98.2 F (36.8 C)  Recent Labs  Lab 09/29/22 0546 09/30/22 0556 10/01/22 0649 10/05/22 0042  WBC 4.4 5.4 4.8 3.8*  CREATININE 4.58* 6.55* 4.09* 7.36*  LATICACIDVEN  --   --   --  2.4*    Estimated Creatinine Clearance: 5.7 mL/min (A) (by C-G formula based on SCr of 7.36 mg/dL (H)).    Allergies  Allergen Reactions   Shellfish Allergy Other (See Comments)    Unknown reaction   Shrimp    Other Rash    Pt states anesthesia makes her brake out in rash     Thank you for allowing pharmacy to be a part of this patient's care.  Cristela Felt, PharmD, BCPS Clinical Pharmacist 10/05/2022 10:16 AM

## 2022-10-05 NOTE — ED Notes (Signed)
Family at bedside. 

## 2022-10-05 NOTE — ED Notes (Signed)
Pt transported to dialysis

## 2022-10-05 NOTE — Discharge Planning (Signed)
Pt currently active with [facility] for Home Health services RN, NT, PT, OT, SW as confirmed by [facility] with Kerry Dory of [facility].  Pt will resume [facility] services upon discharge.  DME needs identified at this time include hospital bed and bedside commode.

## 2022-10-06 ENCOUNTER — Other Ambulatory Visit: Payer: Self-pay | Admitting: Internal Medicine

## 2022-10-06 DIAGNOSIS — W19XXXA Unspecified fall, initial encounter: Secondary | ICD-10-CM | POA: Diagnosis not present

## 2022-10-06 DIAGNOSIS — R531 Weakness: Secondary | ICD-10-CM | POA: Diagnosis not present

## 2022-10-06 DIAGNOSIS — Z7401 Bed confinement status: Secondary | ICD-10-CM | POA: Diagnosis not present

## 2022-10-06 MED ORDER — SEVELAMER HCL 800 MG PO TABS: ORAL_TABLET | ORAL | 0 refills | Status: DC

## 2022-10-06 MED ORDER — LOSARTAN POTASSIUM 25 MG PO TABS: 25.0000 mg | ORAL_TABLET | Freq: Every day | ORAL | 0 refills | Status: DC

## 2022-10-06 MED ORDER — PANTOPRAZOLE SODIUM 40 MG PO TBEC: 40.0000 mg | DELAYED_RELEASE_TABLET | Freq: Every day | ORAL | 0 refills | Status: DC

## 2022-10-06 MED ORDER — APIXABAN 2.5 MG PO TABS: 2.5000 mg | ORAL_TABLET | Freq: Two times a day (BID) | ORAL | 0 refills | Status: DC

## 2022-10-06 MED ORDER — ALBUTEROL SULFATE HFA 108 (90 BASE) MCG/ACT IN AERS: 2.0000 | INHALATION_SPRAY | RESPIRATORY_TRACT | 0 refills | Status: DC | PRN

## 2022-10-06 MED ORDER — APIXABAN 2.5 MG PO TABS
2.5000 mg | ORAL_TABLET | Freq: Once | ORAL | Status: AC
  Administered 2022-10-06: 2.5 mg via ORAL
  Filled 2022-10-06: qty 1

## 2022-10-06 MED ORDER — GABAPENTIN 100 MG PO CAPS: 100.0000 mg | ORAL_CAPSULE | Freq: Every day | ORAL | 0 refills | Status: DC

## 2022-10-06 MED ORDER — METOPROLOL TARTRATE 50 MG PO TABS: 50.0000 mg | ORAL_TABLET | Freq: Two times a day (BID) | ORAL | 0 refills | Status: DC

## 2022-10-06 MED ORDER — FLUTICASONE-SALMETEROL 250-50 MCG/ACT IN AEPB: 1.0000 | INHALATION_SPRAY | Freq: Two times a day (BID) | RESPIRATORY_TRACT | 0 refills | Status: DC

## 2022-10-06 MED ORDER — HYDRALAZINE HCL 100 MG PO TABS: ORAL_TABLET | ORAL | 0 refills | Status: DC

## 2022-10-06 NOTE — ED Notes (Signed)
Attempted to call son numerous times with no answer.  Once reaching son will send patient home by [facility]

## 2022-10-06 NOTE — ED Notes (Signed)
Ptar called 

## 2022-10-06 NOTE — ED Notes (Signed)
The patient has returned from dialysis. This RN has called all three family numbers listed in [place]. No answer. Report given to Eulogio Ditch, RN.

## 2022-10-06 NOTE — Discharge Instructions (Addendum)
Be sure she continues to receive vancomycin 500 mg IV every time she gets dialysis until 11/15/22 Please follow-up with the infectious disease expert on November 9 that is already scheduled Be sure she continues to go to dialysis 3 times a week

## 2022-10-06 NOTE — ED Notes (Signed)
Received verbal order from Dr. Christy Gentles to go ahead and administer the Vancomycin that was ordered yesterday and not given.

## 2022-10-06 NOTE — ED Notes (Signed)
Dialysis nurse did not give vancomycin that was ordered yesterday to be given in dialysis.  Patient is confused and yelling for help explained to patient she is still in the hospital and we are trying to get in touch with her family to come and pick her up.

## 2022-10-06 NOTE — ED Provider Notes (Signed)
I reassumed care of patient after she returned from dialysis. I originally saw this patient on October 30 after a fall from the nursing home.  There was no new traumatic injuries and patient was at her baseline.  Family at that time and requested for her not to return to [facility] and to be discharged home.  She has been seen by our [facility] team who is arranged all home health needs.  Patient can be managed by her family at home.  We have made contact with her son and they are awaiting her arrival at home.  They have asked me to prescribe all of her home medications.  She will need to have close follow-up with her PCP to have ongoing prescriptions filled.  She will also need to continue IV vancomycin with dialysis until December.  She was given vancomycin here after her dialysis session.  She has a follow-up with infectious disease on November 9. Patient has been intermittently confused during the nighttime, though this has been seen previously   Ripley Fraise, MD 10/06/22 [phone]

## 2022-10-06 NOTE — ED Notes (Signed)
Continue to try and reach family numerous times and unsuccessful.This RN has called son and daughters numerous times.

## 2022-10-06 NOTE — Progress Notes (Signed)
Received patient in bed to unit.  Alert and oriented.  Informed consent signed and in chart.   Treatment initiated: 2258 Treatment completed: 0230  Patient tolerated well.  Transported back to the room  Alert, without acute distress.  Hand-off given to patient's nurse.   Access used: graft Access issues: none  Total UF removed: 1500 Medication(s) given: none Post HD VS: 138/80 Post HD weight: unable to obtain. Stretcher   [name]

## 2022-10-07 DIAGNOSIS — N186 End stage renal disease: Secondary | ICD-10-CM | POA: Diagnosis not present

## 2022-10-07 DIAGNOSIS — N2581 Secondary hyperparathyroidism of renal origin: Secondary | ICD-10-CM | POA: Diagnosis not present

## 2022-10-07 DIAGNOSIS — Z992 Dependence on renal dialysis: Secondary | ICD-10-CM | POA: Diagnosis not present

## 2022-10-09 DIAGNOSIS — Z992 Dependence on renal dialysis: Secondary | ICD-10-CM | POA: Diagnosis not present

## 2022-10-09 DIAGNOSIS — N186 End stage renal disease: Secondary | ICD-10-CM | POA: Diagnosis not present

## 2022-10-09 DIAGNOSIS — N2581 Secondary hyperparathyroidism of renal origin: Secondary | ICD-10-CM | POA: Diagnosis not present

## 2022-10-11 DIAGNOSIS — A4102 Sepsis due to Methicillin resistant Staphylococcus aureus: Secondary | ICD-10-CM | POA: Diagnosis not present

## 2022-10-12 DIAGNOSIS — N186 End stage renal disease: Secondary | ICD-10-CM | POA: Diagnosis not present

## 2022-10-12 DIAGNOSIS — N2581 Secondary hyperparathyroidism of renal origin: Secondary | ICD-10-CM | POA: Diagnosis not present

## 2022-10-12 DIAGNOSIS — Z992 Dependence on renal dialysis: Secondary | ICD-10-CM | POA: Diagnosis not present

## 2022-10-13 ENCOUNTER — Telehealth (HOSPITAL_COMMUNITY): Payer: Self-pay | Admitting: Emergency Medicine

## 2022-10-13 NOTE — Telephone Encounter (Signed)
Patient seen in the ED and needs home health care, see orders.

## 2022-10-13 NOTE — Progress Notes (Signed)
CSW received call from patient's daughter Diamond [name] requesting update on home health services. CSW informed Diamond Gros, RN CM of request - RN CM to follow up.  [name], MSW, LCSW Transitions of Care  Clinical Social Worker II [phone]

## 2022-10-14 ENCOUNTER — Ambulatory Visit: Payer: Medicare HMO | Admitting: Internal Medicine

## 2022-10-14 DIAGNOSIS — N2581 Secondary hyperparathyroidism of renal origin: Secondary | ICD-10-CM | POA: Diagnosis not present

## 2022-10-14 DIAGNOSIS — N186 End stage renal disease: Secondary | ICD-10-CM | POA: Diagnosis not present

## 2022-10-14 DIAGNOSIS — Z992 Dependence on renal dialysis: Secondary | ICD-10-CM | POA: Diagnosis not present

## 2022-10-14 NOTE — Telephone Encounter (Signed)
Home health ordered for patient

## 2022-10-15 ENCOUNTER — Telehealth: Payer: Self-pay

## 2022-10-15 ENCOUNTER — Other Ambulatory Visit: Payer: Self-pay

## 2022-10-15 ENCOUNTER — Ambulatory Visit (INDEPENDENT_AMBULATORY_CARE_PROVIDER_SITE_OTHER): Payer: Medicare HMO | Admitting: Internal Medicine

## 2022-10-15 ENCOUNTER — Encounter: Payer: Self-pay | Admitting: Internal Medicine

## 2022-10-15 VITALS — BP 161/100 | HR 75 | Temp 98.3°F

## 2022-10-15 DIAGNOSIS — B9562 Methicillin resistant Staphylococcus aureus infection as the cause of diseases classified elsewhere: Secondary | ICD-10-CM

## 2022-10-15 DIAGNOSIS — M4626 Osteomyelitis of vertebra, lumbar region: Secondary | ICD-10-CM

## 2022-10-15 DIAGNOSIS — N186 End stage renal disease: Secondary | ICD-10-CM | POA: Diagnosis not present

## 2022-10-15 DIAGNOSIS — R7881 Bacteremia: Secondary | ICD-10-CM | POA: Diagnosis not present

## 2022-10-15 MED ORDER — GABAPENTIN 100 MG PO CAPS: 100.0000 mg | ORAL_CAPSULE | Freq: Every day | ORAL | 3 refills | Status: DC

## 2022-10-15 MED ORDER — LIDOCAINE 4 % EX PTCH: 1.0000 | MEDICATED_PATCH | CUTANEOUS | 1 refills | Status: AC

## 2022-10-15 MED ORDER — LIDOCAINE 4 % EX PTCH: 1.0000 | MEDICATED_PATCH | CUTANEOUS | 1 refills | Status: DC

## 2022-10-15 NOTE — Telephone Encounter (Signed)
Spoke with [place] at [facility], asked her to please fax patient's labs to triage, especially vanc trough. She will confirm with nursing staff that patient has been receiving vancomycin at dialysis sessions. Relayed that end date has not changed and is still 11/15/22 per Dr. Gale Journey.   P: [phone]   Beryle Flock, RN

## 2022-10-15 NOTE — Addendum Note (Signed)
Addended by: Prudencio Pair T on: 10/15/2022 11:29 AM   Modules accepted: Orders

## 2022-10-15 NOTE — Patient Instructions (Signed)
We confirmed that you are taking vancomycin through dialysis currently. This will go until 11/15/22  At that time will need to determine if you need any extension of antibiotics which we typically do by pills  For pain control: 1) try lidocaine patch once a day to the affected area 2) for gabapentin, gradually titrate up 1 pill at a time, taken at night, until you get to 3 pills per occasion. Titrate up each step over a full week.   My goal for pain control is not to get rid of it because you'll be too sedated. The goal is to get you to function without pain limitation

## 2022-10-15 NOTE — Telephone Encounter (Signed)
Thanks Megan!

## 2022-10-15 NOTE — Progress Notes (Signed)
[facility] for Infectious Disease  Patient Active Problem List   Diagnosis Date Noted   PAF (paroxysmal atrial fibrillation) ([place])    Thyroid nodule 09/17/2022   Empty sella ([place]) 09/17/2022   Discitis of lumbosacral region 07/26/2022   Hypertensive urgency 07/21/2022   Bacteremia 06/30/2022   Malnutrition of moderate degree 06/22/2022   Physical deconditioning 06/18/2022   Pressure injury of skin 06/16/2022   MRSA bacteremia 06/13/2022   ESRD on dialysis [facility])    Paroxysmal atrial flutter (HCC)    Acute metabolic encephalopathy    Acute on chronic anemia with positive fecal occult  02/05/2022   Multiple polyps of sigmoid colon    Heme positive stool    Duodenitis    Candida esophagitis (HCC)    Gallbladder mass    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  01/16/2022   Septic discitis of lumbar region 01/16/2022   Hepatitis C without hepatic coma 01/15/2022   Sepsis due to methicillin resistant Staphylococcus aureus ([place]) 12/31/2021   Atrial flutter ([place]) 12/25/2021   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 seen on TEE on 1/17 12/24/2021   Lung nodule seen on imaging study 12/23/2021   Vertebral osteomyelitis ([place])    Sepsis due to methicillin susceptible Staphylococcus aureus ([place]) 12/17/2021   Lower back pain 12/17/2021   ESRD on hemodialysis ([place]) 12/17/2021   Encounter for screening for other viral diseases 11/03/2021   Allergy, unspecified, initial encounter 09/14/2021   Coagulation defect, unspecified ([place]) 82/95/6213   Complication of vascular dialysis catheter 09/14/2021   Gout due to renal impairment, right ankle and foot 09/14/2021   Iron deficiency anemia, unspecified 09/14/2021   Pain, unspecified 09/14/2021   Pruritus, unspecified 09/14/2021   Secondary hyperparathyroidism of renal origin ([place]) 09/14/2021   Shortness of breath 09/14/2021   Unspecified protein-calorie malnutrition ([place]) 09/14/2021   Vitamin  D deficiency 01/02/2021   Hyperparathyroidism ([place]) 12/31/2020   CKD (chronic kidney disease), stage III ([place]) 11/10/2019   Cardiomegaly 11/05/2019   Hyperkalemia 11/05/2019   Hypokalemia 10/18/2019   Pneumonia due to COVID-19 virus 10/17/2019   Hypertension    Asthma    Gout    Hyponatremia    Multifactorial anemia-acute blood loss from bleeding HD catheter site-superimposed on anemia related to ESRD.       Subjective:    Patient ID: Diamond [name], female    DOB: 1951/09/12, 71 y.o.   MRN: [id number]  Chief Complaint  Patient presents with   Follow-up    HPI:  Diamond [name] is a 71 y.o. female esrd, recurrent mrsa bacteremia, recent finished 4 weeks iv cefazolin 07/13/22 for mrsa bacteremia then readmitted 2 weeks later 8/22 for acute on chronic back pain with mri concerning for l2-3 paravertebral edema and still elevated crp, here for hospital f/u  She was discharged on planned 4 more weeks of bactrim planned to complete 8 week treatment  She reports less pain today since admission but still bad 8/10  No fever, chill Has good appetite No rash No n/v No diarrhea  Of note, patient had her gallbladder removed 7/21 and since has been on wheel chair   09/15/22 id clinic f/u She finished bactrim a couple weeks ago she think but not sure Pain same as last visit No worsening appetite No f/c No numbness/tingling No urinary incontinence  10/15/22 id clinic f/u Patient had fever and found to have recurrent mrsa bacteremia and also mri lumbar spine progression of l2-4  vertebral om and ventral epidural phlegmon 10/13 bcx mrsa (R tetra; sensitive to bactrim) 10/16 bcx ngtd She was admitted to [facility] and placed on 8 weeks vancomycin with dialysis until planned 11/15/22 She has moderate back pain still but no fever chill. Same as 09/2022 No malaise/f/c No diarrhea       Allergies  Allergen Reactions   Shellfish Allergy Other (See Comments)    Unknown reaction    Shrimp    Other Rash    Pt states anesthesia makes her brake out in rash      Outpatient Medications Prior to Visit  Medication Sig Dispense Refill   acetaminophen (TYLENOL) 325 MG tablet Take 2 tablets (650 mg total) by mouth 4 (four) times daily. (Patient taking differently: Take 650 mg by mouth 4 (four) times daily as needed for moderate pain.)     albuterol (VENTOLIN HFA) 108 (90 Base) MCG/ACT inhaler Inhale 2 puffs into the lungs every 4 (four) hours as needed for wheezing or shortness of breath. 6.7 g 0   apixaban (ELIQUIS) 2.5 MG TABS tablet Take 1 tablet (2.5 mg total) by mouth 2 (two) times daily. 60 tablet 0   fluticasone-salmeterol (ADVAIR DISKUS) 250-50 MCG/ACT AEPB Inhale 1 puff into the lungs in the morning and at bedtime. 14 each 0   sevelamer (RENAGEL) 800 MG tablet Take 2 tablets by mouth 3 times a day every Tuesday Thursday and Saturday Give 2 tablets by mouth with meals every Monday Wednesday Friday and Sunday 60 tablet 0   gabapentin (NEURONTIN) 100 MG capsule Take 1 capsule (100 mg total) by mouth at bedtime. 30 capsule 0   hydrALAZINE (APRESOLINE) 100 MG tablet Take 1 tablet by mouth 3 times a day every Monday Wednesday Friday and Sunday Take 1 tablet by mouth 2 times a day every Tuesday Thursday and Saturday 30 tablet 0   losartan (COZAAR) 25 MG tablet Take 1 tablet (25 mg total) by mouth daily. 30 tablet 0   metoprolol tartrate (LOPRESSOR) 50 MG tablet Take 1 tablet (50 mg total) by mouth 2 (two) times daily. 60 tablet 0   pantoprazole (PROTONIX) 40 MG tablet Take 1 tablet (40 mg total) by mouth daily. 30 tablet 0   vancomycin IVPB Inject 500 mg into the vein Every Tuesday,Thursday,and Saturday with dialysis. Indication:  MRSA discitis/osteomyelitis  First Dose: Yes Last Day of Therapy:  11/15/22 Labs - Sunday/Monday:  CBC/D, BMP, and vancomycin trough. Labs - Thursday:  BMP and vancomycin trough Labs - Every other week:  ESR and CRP Method of  administration:Elastomeric Method of administration may be changed at the discretion of the patient and/or caregiver's ability to self-administer the medication ordered. (Patient not taking: Reported on 10/15/2022) 23 Units 0   No facility-administered medications prior to visit.     Social History   Socioeconomic History   Marital status: Widowed    Spouse name: Not on file   Number of children: Not on file   Years of education: Not on file   Highest education level: Not on file  Occupational History   Not on file  Tobacco Use   Smoking status: Never    Passive exposure: Never   Smokeless tobacco: Never  Vaping Use   Vaping Use: Never used  Substance and Sexual Activity   Alcohol use: No   Drug use: No   Sexual activity: Not Currently    Birth control/protection: Post-menopausal  Other Topics Concern   Not on file  Social History Narrative  Not on file   Social Determinants of Health   Financial Resource Strain: Not on file  Food Insecurity: No Food Insecurity (07/20/2022)   Hunger Vital Sign    Worried About Running Out of Food in the Last Year: Never true    Ran Out of Food in the Last Year: Never true  Transportation Needs: No Transportation Needs (07/20/2022)   PRAPARE - Hydrologist (Medical): No    Lack of Transportation (Non-Medical): No  Physical Activity: Not on file  Stress: Not on file  Social Connections: Moderately Integrated (07/20/2022)   Social Connection and Isolation Panel [NHANES]    Frequency of Communication with Friends and Family: Three times a week    Frequency of Social Gatherings with Friends and Family: Three times a week    Attends Religious Services: 1 to 4 times per year    Active Member of Clubs or Organizations: Yes    Attends Archivist Meetings: 1 to 4 times per year    Marital Status: Widowed  Intimate Partner Violence: Not At Risk (07/20/2022)   Humiliation, Afraid, Rape, and Kick  questionnaire    Fear of Current or Ex-Partner: No    Emotionally Abused: No    Physically Abused: No    Sexually Abused: No      Review of Systems    All other ros negative  Objective:    BP (!) 161/100   Pulse 75   Temp 98.3 F (36.8 C) (Oral)   SpO2 99%  Nursing note and vital signs reviewed.  Physical Exam     General/constitutional: no distress, pleasant; she is in a wheel chair   HEENT: Normocephalic, PER, Conj Clear, EOMI, Oropharynx clear Neck supple CV: rrr no mrg Lungs: clear to auscultation, normal respiratory effort Abd: Soft, Nontender Ext: no edema Skin: No Rash Neuro: nonfocal MSK: no peripheral joint swelling/tenderness/warmth; back spines nontender       Labs: Lab Results  Component Value Date   WBC 3.8 (L) 10/05/2022   HGB 11.2 (L) 10/05/2022   HCT 33.8 (L) 10/05/2022   MCV 107.6 (H) 10/05/2022   PLT 106 (L) 93/81/0175   Last metabolic panel Lab Results  Component Value Date   GLUCOSE 112 (H) 10/05/2022   NA 143 10/05/2022   K 4.1 10/05/2022   CL 97 (L) 10/05/2022   CO2 28 10/05/2022   BUN 34 (H) 10/05/2022   CREATININE 7.36 (H) 10/05/2022   GFRNONAA 5 (L) 10/05/2022   CALCIUM 11.0 (H) 10/05/2022   PHOS 2.8 10/01/2022   PROT 6.4 (L) 09/27/2022   ALBUMIN 2.5 (L) 10/01/2022   LABGLOB 3.6 10/17/2019   AGRATIO 0.6 (L) 10/17/2019   BILITOT 0.6 09/27/2022   ALKPHOS 53 09/27/2022   AST 17 09/27/2022   ALT 8 09/27/2022   ANIONGAP 18 (H) 10/05/2022       Component Value Date/Time   CRP 11.8 (H) 09/21/2022 2112   CRP 22.3 (H) 09/18/2022 0646   CRP 19.3 (H) 09/17/2022 0950    Micro:  Serology:  Imaging: I personally reviewed and incorporated into decision making  10/18 tte  1. Left ventricular ejection fraction, by estimation, is 60 to 65%. The  left ventricle has normal function. The left ventricle has no regional  wall motion abnormalities. There is mild concentric left ventricular  hypertrophy. Left ventricular  diastolic  function could not be evaluated.   2. Right ventricular systolic function is normal. The right ventricular  size is  normal. There is normal pulmonary artery systolic pressure.   3. Left atrial size was moderately dilated.   4. Previously noted mobile mass in the RA not visible on this study.  Right atrial size was mildly dilated.   5. The mitral valve is abnormal. Mild mitral valve regurgitation. No  evidence of mitral stenosis.   6. Tricuspid valve regurgitation is mild to moderate.   7. The aortic valve is tricuspid. There is mild calcification of the  aortic valve. Aortic valve regurgitation is trivial. Aortic valve  sclerosis/calcification is present, without any evidence of aortic  stenosis. Aortic valve area, by VTI measures 2.96  cm. Aortic valve mean gradient measures 3.0 mmHg. Aortic valve Vmax  measures 1.24 m/s.   8. Aortic dilatation noted. There is borderline dilatation of the  ascending aorta, measuring 38 mm.   9. The inferior vena cava is dilated in size with <50% respiratory  variability, suggesting right atrial pressure of 15 mmHg.    10/13 mri lumbar spine 1. Findings are concerning for progression of discitis/osteomyelitis with increased soft tissue extent of infection in the paraspinal and epidural spaces. Compared to prior exam from 07/26/2022, there is increased abnormal soft tissue in the ventral epidural space at the L2-L3 and L3-L4 disc space levels resulting in moderate to severe spinal canal narrowing at these levels. There is also increased signal abnormality in the surrounding paraspinal soft tissues and bilateral psoas musculature. 2. Persistent severe spinal canal narrowing at the L5-S1 level secondary to a central disc protrusion. 3. Stable compression deformities of the L2, L3, L4, and L5 vertebral bodies with irregular endplate erosion at the L4-L5 level, best seen on CT of the L-spine obtained on 09/16/2022.    Assessment & Plan:    Problem List Items Addressed This Visit       Other   MRSA bacteremia - Primary   Other Visit Diagnoses     Osteomyelitis of lumbar spine ([place])       ESRD (end stage renal disease) ([place])           ----------- 09/15/22 Will see what crp is doing Doesn't appear her infection is active at this time Exam showed no tenderness and the pain suspected to be neuropathic  Will repeat lab again and evaluation in 4 weeks Advise complex pain clinic management  If crp continues to go up will consider mri  Will start gabapentin  Advise she talks to pcp about  pain clinic referral   -------- 10/15/22 assessment Recurrent bacteremia mrsa with lumbar spine om; no frank epidural abscess Back pain stable Getting vanc with iHD until 11/15/2022 Will reassess then to see if she still needs oral abx tail  Trial of lidocaine patch -- rx Increase gabapentin gradually to 300 mg qhs -- renew rx  See me around 2nd week of December     Follow-up: Return in about 4 weeks (around 11/12/2022).      Jabier Mutton, [facility]ious [facility] 10/15/2022, 11:03 AM

## 2022-10-16 DIAGNOSIS — N186 End stage renal disease: Secondary | ICD-10-CM | POA: Diagnosis not present

## 2022-10-16 DIAGNOSIS — Z992 Dependence on renal dialysis: Secondary | ICD-10-CM | POA: Diagnosis not present

## 2022-10-16 DIAGNOSIS — N2581 Secondary hyperparathyroidism of renal origin: Secondary | ICD-10-CM | POA: Diagnosis not present

## 2022-10-18 DIAGNOSIS — A4102 Sepsis due to Methicillin resistant Staphylococcus aureus: Secondary | ICD-10-CM | POA: Diagnosis not present

## 2022-10-19 DIAGNOSIS — Z992 Dependence on renal dialysis: Secondary | ICD-10-CM | POA: Diagnosis not present

## 2022-10-19 DIAGNOSIS — N2581 Secondary hyperparathyroidism of renal origin: Secondary | ICD-10-CM | POA: Diagnosis not present

## 2022-10-19 DIAGNOSIS — N186 End stage renal disease: Secondary | ICD-10-CM | POA: Diagnosis not present

## 2022-10-21 DIAGNOSIS — N2581 Secondary hyperparathyroidism of renal origin: Secondary | ICD-10-CM | POA: Diagnosis not present

## 2022-10-21 DIAGNOSIS — Z992 Dependence on renal dialysis: Secondary | ICD-10-CM | POA: Diagnosis not present

## 2022-10-21 DIAGNOSIS — N186 End stage renal disease: Secondary | ICD-10-CM | POA: Diagnosis not present

## 2022-10-22 NOTE — Progress Notes (Unsigned)
Office Visit    Patient Name: Diamond [name] Date of Encounter: 10/22/2022  PCP:  [name], MD   [place]  Cardiologist:  Freada Bergeron, MD  Advanced Practice Provider:  No care team member to display Electrophysiologist:  None   HPI    Diamond [name] is a 71 y.o. female with past medical history significant for ESRD on HD, asthma, hypertension, anemia of chronic disease, atrial flutter, admitted 1/23 for MRSA bacteremia and IE presents today for follow-up.  She was admitted 12/16/2021 to 12/30/2021 due to MRSA bacteremia with hospital course complicated by atrial flutter and bleeding from HD catheter site.  TEE 12/22/2021 with right atrial/IVC globular mobile mass compatible with thrombolytic/vegetative material.  Received IV antibiotics through 02/13/2022.  Incidental finding of lung nodule recommended for repeat CT in 3 months.  She was seen in the clinic 01/2022 by Laurann Montana, NP where she is doing well.  No CV symptoms at that time.  She was seen by [name] 03/15/2022 and at that time she was recovering from fistula placement on her left arm.  Her son had reported she had allergic reaction to the anesthesia which caused her lips to swell.  She was seen in the ER where she was given IM Solu-Medrol and Benadryl with resolution of symptoms.  After the procedure, she developed a cough with chest x-ray revealing new left lower lobe infiltrate concerning for pneumonia.  She was on doxycycline at that time.  She continued to have a productive cough.  She was tolerating Eliquis with no bleeding complications.  Typically her blood pressure was in the 341P systolic.  Today, she ***  Past Medical History    Past Medical History:  Diagnosis Date   Anemia    Asthma    Atrial flutter with rapid ventricular response ([place]) 12/25/2021   Chronic kidney disease    dialysis Tues Thurs Sat   Gout    Hypertension    Past Surgical History:   Procedure Laterality Date   AV FISTULA PLACEMENT Left 12/23/2021   Procedure: LEFT ARM BRACHIOBASILIC VEIN ARTERIOVENOUS (AV) FISTULA CREATION;  Surgeon: [name], MD;  Location: [facility];  Service: Vascular;  Laterality: Left;   [place] Left 03/08/2022   Procedure: LEFT SECOND STAGE BASILIC VEIN TRANSPOSITION;  Surgeon: [name], MD;  Location: [facility] OR;  Service: Vascular;  Laterality: Left;   BIOPSY  01/19/2022   Procedure: BIOPSY;  Surgeon: Lavena Bullion, DO;  Location: [facility] ENDOSCOPY;  Service: Gastroenterology;;   [place] N/A 06/25/2022   Procedure: OPEN CHOLECYSTECTOMY;  Surgeon: Dwan Bolt, MD;  Location: [facility];  Service: General;  Laterality: N/A;   COLONOSCOPY Left 01/19/2022   Procedure: COLONOSCOPY;  Surgeon: Lavena Bullion, DO;  Location: [place] [place];  Service: Gastroenterology;  Laterality: Left;   ESOPHAGOGASTRODUODENOSCOPY Left 01/19/2022   Procedure: ESOPHAGOGASTRODUODENOSCOPY (EGD);  Surgeon: Lavena Bullion, DO;  Location: [facility] ENDOSCOPY;  Service: Gastroenterology;  Laterality: Left;   INSERTION OF DIALYSIS CATHETER Right 12/23/2021   Procedure: INSERTION OF DIALYSIS CATHETER;  Surgeon: [name], MD;  Location: [place];  Service: Vascular;  Laterality: Right;   LAPAROSCOPY N/A 06/25/2022   Procedure: STAGING LAPAROSCOPY;  Surgeon: Dwan Bolt, MD;  Location: [facility];  Service: General;  Laterality: N/A;   OPEN PARTIAL HEPATECTOMY  N/A 06/25/2022   Procedure: OPEN PARTIAL HEPATECTOMY;  Surgeon: Dwan Bolt, MD;  Location:  [place] [facility] OR;  Service: General;  Laterality: N/A;   POLYPECTOMY  01/19/2022   Procedure: POLYPECTOMY;  Surgeon: Lavena Bullion, DO;  Location: [facility] ENDOSCOPY;  Service: Gastroenterology;;   TEE WITHOUT CARDIOVERSION N/A 12/22/2021   Procedure: TRANSESOPHAGEAL ECHOCARDIOGRAM (TEE);  Surgeon: Jerline Pain, MD;  Location: [facility] ENDOSCOPY;  Service: Cardiovascular;  Laterality: N/A;    TEE WITHOUT CARDIOVERSION N/A 06/17/2022   Procedure: TRANSESOPHAGEAL ECHOCARDIOGRAM (TEE);  Surgeon: Buford Dresser, MD;  Location: [place];  Service: Cardiovascular;  Laterality: N/A;   ULTRASOUND GUIDANCE FOR VASCULAR ACCESS  12/23/2021   Procedure: ULTRASOUND GUIDANCE FOR VASCULAR ACCESS;  Surgeon: [name], MD;  Location: [facility] OR;  Service: Vascular;;    Allergies  Allergies  Allergen Reactions   Shellfish Allergy Other (See Comments)    Unknown reaction   Shrimp    Other Rash    Pt states anesthesia makes her brake out in rash     EKGs/Labs/Other Studies Reviewed:   The following studies were reviewed today:   TTE 03/03/22: IMPRESSIONS   1. Left ventricular ejection fraction, by estimation, is 60 to 65%. The  left ventricle has normal function. The left ventricle has no regional  wall motion abnormalities. Left ventricular diastolic parameters are  consistent with Grade I diastolic  dysfunction (impaired relaxation).   2. Right ventricular systolic function is normal. The right ventricular  size is normal.   3. Highly mobile mass in the right atrium the origin/attachment site is  not well visualized. This appears smaller in size than previously noted  right atrial mass.   4. The mitral valve is normal in structure. Trivial mitral valve  regurgitation. No evidence of mitral stenosis.   5. Tricuspid valve regurgitation is mild to moderate.   6. Highly mobile 0.836 cm x 0.694 cm mass in the RVOT appears to be  attached to the annulus of the aortic valve above the right cusp, present  on previous study. The aortic valve is normal in structure. Aortic valve  regurgitation is not visualized.  Aortic valve sclerosis is present, with no evidence of aortic valve  stenosis.   7. There is dilatation of the ascending aorta, measuring 39 mm.   8. The inferior vena cava is normal in size with greater than 50%  respiratory variability, suggesting right atrial  pressure of 3 mmHg.   Comparison(s): No significant change from prior study.    CTA Abdomen 01/16/22 IMPRESSION: 1. No evidence of active GI bleed. 2. Cholelithiasis with gallbladder wall edema. If there is clinical concern for acute cholecystitis, further evaluation with ultrasound or HIDA scan recommended. 3. Masslike thickening of the gallbladder fundus may represent advanced adenomyomatosis versus neoplasm. Further evaluation with MRI without and with contrast is recommended. 4. 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 Further evaluation with lumbar spine MRI without and with contrast is recommended. 5. There is a 9 mm right middle lobe nodule. Follow-up as per recommendation of prior CT. 6. Aortic Atherosclerosis (ICD10-I70.0).   TEE: 12/22/2021  1. There is a 2.1 x 1.37 mutilobulated mobile mass attached to the RA/IVC junction which has the appearance of thrombus/vegetation in the setting of bacteremia and recently removed dialysis catheter.   2. Left ventricular ejection fraction, by estimation, is 60 to 65%. The  left ventricle has normal function. The left ventricle has no regional  wall motion abnormalities.   3. Right ventricular systolic function is normal. The right ventricular  size is normal.   4. No left atrial/left atrial appendage  thrombus was detected.   5. No mitral valve vegetation. The mitral valve is degenerative. Trivial  mitral valve regurgitation. No evidence of mitral stenosis.   6. The aortic valve is normal in structure. Aortic valve regurgitation is  not visualized. No aortic stenosis is present.   7. The inferior vena cava is normal in size with greater than 50%  respiratory variability, suggesting right atrial pressure of 3 mmHg.    Echo 12/18/21  1. Left ventricular ejection fraction, by estimation, is 60 to 65%. The  left ventricle has normal function. The left ventricle has no regional  wall motion abnormalities. There is moderate hypertrophy of the  basal  septum. The rest of the LV segments  demonstrate mild left ventricular hypertrophy. Left ventricular diastolic  parameters are consistent with Grade I diastolic dysfunction (impaired  relaxation).   2. Right ventricular systolic function is normal. The right ventricular  size is normal. There is normal pulmonary artery systolic pressure.   3. The mitral valve leaflets are thickened. There are fibrinous densities  visualized on the anterior mitral valve leaflet on the apical 3C view.  Given underlying bacteremia, findings concerning for vegetation formation  (versus degenerative valve  changes). There is trivial mitral regurgitation and no evidence of valve  destruction. Recommend TEE for further evaluation.   4. The aortic valve is tricuspid. There is mild calcification of the  aortic valve. There is moderate thickening of the aortic valve. Aortic  valve regurgitation is trivial. Aortic valve sclerosis/calcification is  present, without any evidence of aortic  stenosis.   5. Aortic dilatation noted. There is mild dilatation of the ascending  aorta, measuring 40 mm.   6. There is a highly mobile, fibrinous structure visualized in the RA  most likely a eustachian valve or chiari network.   Comparison(s): Compared to prior TTE in 11/2019, the mitral valve leaflets  continue to be thickened, however, there is concern for possible  vegetation on current study. Otherwise, the TR appears mild (previously  mod-to-severe).   Conclusion(s)/Recommendation(s): Findings concerning for mitral valve  vegetation, would recommend a Transesophageal Echocardiogram for  clarification.   EKG:  EKG is *** ordered today.  The ekg ordered today demonstrates ***  Recent Labs: 06/12/2022: B Natriuretic Peptide 814.2 09/22/2022: Magnesium 1.7 09/23/2022: TSH 3.268 09/27/2022: ALT 8 10/05/2022: BUN 34; Creatinine, Ser 7.36; Hemoglobin 11.2; Platelets 106; Potassium 4.1; Sodium 143  Recent Lipid  Panel    Component Value Date/Time   CHOL 188 02/23/2011 0431   TRIG 122 02/23/2011 0431   HDL 84 02/23/2011 0431   CHOLHDL 2.2 Ratio 02/23/2011 0431   VLDL 24 02/23/2011 0431   LDLCALC 80 02/23/2011 0431    Risk Assessment/Calculations:  {Does this patient have ATRIAL FIBRILLATION?[phone]}  Home Medications   No outpatient medications have been marked as taking for the 10/25/22 encounter (Appointment) with Elgie Collard, PA-C.     Review of Systems   ***   All other systems reviewed and are otherwise negative except as noted above.  Physical Exam    VS:  There were no vitals taken for this visit. , BMI There is no height or weight on file to calculate BMI.  Wt Readings from Last 3 Encounters:  09/27/22 114 lb 10.2 oz (52 kg)  09/15/22 114 lb (51.7 kg)  07/29/22 111 lb 8.8 oz (50.6 kg)     GEN: Well nourished, well developed, in no acute distress. HEENT: normal. Neck: Supple, no JVD, carotid bruits, or masses. Cardiac: ***  RRR, no murmurs, rubs, or gallops. No clubbing, cyanosis, edema.  ***Radials/PT 2+ and equal bilaterally.  Respiratory:  ***Respirations regular and unlabored, clear to auscultation bilaterally. GI: Soft, nontender, nondistended. MS: No deformity or atrophy. Skin: Warm and dry, no rash. Neuro:  Strength and sensation are intact. Psych: Normal affect.  Assessment & Plan    Typical atrial flutter Right atrial mass ESRD on dialysis History of MRSA bacteremia Endocarditis Essential hypertension Pneumonia of left lower lobe  No BP recorded.  {Refresh Note OR Click here to enter BP  :1}***      Disposition: Follow up {follow up:15908} with Freada Bergeron, MD or APP.  Signed, Elgie Collard, PA-C 10/22/2022, 1:12 PM [facility]

## 2022-10-23 DIAGNOSIS — N2581 Secondary hyperparathyroidism of renal origin: Secondary | ICD-10-CM | POA: Diagnosis not present

## 2022-10-23 DIAGNOSIS — N186 End stage renal disease: Secondary | ICD-10-CM | POA: Diagnosis not present

## 2022-10-23 DIAGNOSIS — Z992 Dependence on renal dialysis: Secondary | ICD-10-CM | POA: Diagnosis not present

## 2022-10-25 ENCOUNTER — Ambulatory Visit: Payer: Medicare HMO | Admitting: Physician Assistant

## 2022-10-25 DIAGNOSIS — Z992 Dependence on renal dialysis: Secondary | ICD-10-CM | POA: Diagnosis not present

## 2022-10-25 DIAGNOSIS — I5189 Other ill-defined heart diseases: Secondary | ICD-10-CM

## 2022-10-25 DIAGNOSIS — N186 End stage renal disease: Secondary | ICD-10-CM | POA: Diagnosis not present

## 2022-10-25 DIAGNOSIS — J189 Pneumonia, unspecified organism: Secondary | ICD-10-CM

## 2022-10-25 DIAGNOSIS — N184 Chronic kidney disease, stage 4 (severe): Secondary | ICD-10-CM

## 2022-10-25 DIAGNOSIS — Z87898 Personal history of other specified conditions: Secondary | ICD-10-CM

## 2022-10-25 DIAGNOSIS — R911 Solitary pulmonary nodule: Secondary | ICD-10-CM

## 2022-10-25 DIAGNOSIS — I10 Essential (primary) hypertension: Secondary | ICD-10-CM

## 2022-10-25 DIAGNOSIS — I330 Acute and subacute infective endocarditis: Secondary | ICD-10-CM

## 2022-10-25 DIAGNOSIS — N2581 Secondary hyperparathyroidism of renal origin: Secondary | ICD-10-CM | POA: Diagnosis not present

## 2022-10-25 DIAGNOSIS — I483 Typical atrial flutter: Secondary | ICD-10-CM

## 2022-10-27 DIAGNOSIS — Z992 Dependence on renal dialysis: Secondary | ICD-10-CM | POA: Diagnosis not present

## 2022-10-27 DIAGNOSIS — N2581 Secondary hyperparathyroidism of renal origin: Secondary | ICD-10-CM | POA: Diagnosis not present

## 2022-10-27 DIAGNOSIS — N186 End stage renal disease: Secondary | ICD-10-CM | POA: Diagnosis not present

## 2022-10-30 DIAGNOSIS — N2581 Secondary hyperparathyroidism of renal origin: Secondary | ICD-10-CM | POA: Diagnosis not present

## 2022-10-30 DIAGNOSIS — Z992 Dependence on renal dialysis: Secondary | ICD-10-CM | POA: Diagnosis not present

## 2022-10-30 DIAGNOSIS — N186 End stage renal disease: Secondary | ICD-10-CM | POA: Diagnosis not present

## 2022-11-02 DIAGNOSIS — N186 End stage renal disease: Secondary | ICD-10-CM | POA: Diagnosis not present

## 2022-11-02 DIAGNOSIS — N2581 Secondary hyperparathyroidism of renal origin: Secondary | ICD-10-CM | POA: Diagnosis not present

## 2022-11-02 DIAGNOSIS — Z992 Dependence on renal dialysis: Secondary | ICD-10-CM | POA: Diagnosis not present

## 2022-11-04 DIAGNOSIS — N2581 Secondary hyperparathyroidism of renal origin: Secondary | ICD-10-CM | POA: Diagnosis not present

## 2022-11-04 DIAGNOSIS — Z992 Dependence on renal dialysis: Secondary | ICD-10-CM | POA: Diagnosis not present

## 2022-11-04 DIAGNOSIS — I120 Hypertensive chronic kidney disease with stage 5 chronic kidney disease or end stage renal disease: Secondary | ICD-10-CM | POA: Diagnosis not present

## 2022-11-04 DIAGNOSIS — N186 End stage renal disease: Secondary | ICD-10-CM | POA: Diagnosis not present

## 2022-11-10 ENCOUNTER — Ambulatory Visit: Payer: Medicare HMO | Admitting: Internal Medicine

## 2022-11-11 ENCOUNTER — Ambulatory Visit: Payer: Medicare HMO | Admitting: Internal Medicine

## 2022-11-16 ENCOUNTER — Encounter (HOSPITAL_COMMUNITY): Payer: Self-pay

## 2022-11-18 NOTE — Progress Notes (Signed)
Office Visit    Patient Name: Diamond [name] Date of Encounter: 11/19/2022  PCP:  [name], MD   [place]  Cardiologist:  Freada Bergeron, MD  Advanced Practice Provider:  No care team member to display Electrophysiologist:  None   HPI    Diamond [name] is a 71 y.o. female with past medical history significant for ESRD on HD, asthma, hypertension, anemia of chronic disease, atrial flutter, admitted 1/23 for MRSA bacteremia and IE presents today for follow-up.  She was admitted 12/16/2021 to 12/30/2021 due to MRSA bacteremia with hospital course complicated by atrial flutter and bleeding from HD catheter site.  TEE 12/22/2021 with right atrial/IVC globular mobile mass compatible with thrombolytic/vegetative material.  Received IV antibiotics through 02/13/2022.  Incidental finding of lung nodule recommended for repeat CT in 3 months.  She was seen in the clinic 01/2022 by Laurann Montana, NP where she is doing well.  No CV symptoms at that time.  She was seen by [name] 03/15/2022 and at that time she was recovering from fistula placement on her left arm.  Her son had reported she had allergic reaction to the anesthesia which caused her lips to swell.  She was seen in the ER where she was given IM Solu-Medrol and Benadryl with resolution of symptoms.  After the procedure, she developed a cough with chest x-ray revealing new left lower lobe infiltrate concerning for pneumonia.  She was on doxycycline at that time.  She continued to have a productive cough.  She was tolerating Eliquis with no bleeding complications.  Typically her blood pressure was in the 268T systolic.  Today, she states that she was given too much anesthesia when she was in the hospital and now she has issues with her memory. She had a mechanical fall back in October where she slipped out of bed because she had no railing on bed. She did not hit her head. She has not had any swelling in  her feet or any other symptoms.   Reports no shortness of breath nor dyspnea on exertion. Reports no chest pain, pressure, or tightness. No edema, orthopnea, PND. Reports no palpitations.   Past Medical History    Past Medical History:  Diagnosis Date   Anemia    Asthma    Atrial flutter with rapid ventricular response ([place]) 12/25/2021   Chronic kidney disease    dialysis Tues Thurs Sat   Gout    Hypertension    Past Surgical History:  Procedure Laterality Date   AV FISTULA PLACEMENT Left 12/23/2021   Procedure: LEFT ARM BRACHIOBASILIC VEIN ARTERIOVENOUS (AV) FISTULA CREATION;  Surgeon: [name], MD;  Location: [place];  Service: Vascular;  Laterality: Left;   [place] Left 03/08/2022   Procedure: LEFT SECOND STAGE BASILIC VEIN TRANSPOSITION;  Surgeon: [name], MD;  Location: [facility] OR;  Service: Vascular;  Laterality: Left;   BIOPSY  01/19/2022   Procedure: BIOPSY;  Surgeon: Lavena Bullion, DO;  Location: [place] [facility];  Service: Gastroenterology;;   [place] N/A 06/25/2022   Procedure: OPEN CHOLECYSTECTOMY;  Surgeon: Dwan Bolt, MD;  Location: [place];  Service: General;  Laterality: N/A;   COLONOSCOPY Left 01/19/2022   Procedure: COLONOSCOPY;  Surgeon: Lavena Bullion, DO;  Location: [place];  Service: Gastroenterology;  Laterality: Left;   ESOPHAGOGASTRODUODENOSCOPY Left 01/19/2022   Procedure: ESOPHAGOGASTRODUODENOSCOPY (EGD);  Surgeon: Lavena Bullion, DO;  Location:  [place] [facility];  Service: Gastroenterology;  Laterality: Left;   INSERTION OF DIALYSIS CATHETER Right 12/23/2021   Procedure: INSERTION OF DIALYSIS CATHETER;  Surgeon: [name], MD;  Location: [place];  Service: Vascular;  Laterality: Right;   LAPAROSCOPY N/A 06/25/2022   Procedure: STAGING LAPAROSCOPY;  Surgeon: Dwan Bolt, MD;  Location: [place];  Service: General;  Laterality: N/A;   OPEN PARTIAL HEPATECTOMY  N/A 06/25/2022    Procedure: OPEN PARTIAL HEPATECTOMY;  Surgeon: Dwan Bolt, MD;  Location: [place];  Service: General;  Laterality: N/A;   POLYPECTOMY  01/19/2022   Procedure: POLYPECTOMY;  Surgeon: Lavena Bullion, DO;  Location: [place] [facility];  Service: Gastroenterology;;   TEE WITHOUT CARDIOVERSION N/A 12/22/2021   Procedure: TRANSESOPHAGEAL ECHOCARDIOGRAM (TEE);  Surgeon: Jerline Pain, MD;  Location[facility];  Service: Cardiovascular;  Laterality: N/A;   TEE WITHOUT CARDIOVERSION N/A 06/17/2022   Procedure: TRANSESOPHAGEAL ECHOCARDIOGRAM (TEE);  Surgeon: Buford Dresser, MD;  Location: [place];  Service: Cardiovascular;  Laterality: N/A;   ULTRASOUND GUIDANCE FOR VASCULAR ACCESS  12/23/2021   Procedure: ULTRASOUND GUIDANCE FOR VASCULAR ACCESS;  Surgeon: [name], MD;  Location: [facility] OR;  Service: Vascular;;    Allergies  Allergies  Allergen Reactions   Shellfish Allergy Other (See Comments)    Unknown reaction   Shrimp    Other Rash    Pt states anesthesia makes her brake out in rash     EKGs/Labs/Other Studies Reviewed:   The following studies were reviewed today:   TTE 03/03/22: IMPRESSIONS   1. Left ventricular ejection fraction, by estimation, is 60 to 65%. The  left ventricle has normal function. The left ventricle has no regional  wall motion abnormalities. Left ventricular diastolic parameters are  consistent with Grade I diastolic  dysfunction (impaired relaxation).   2. Right ventricular systolic function is normal. The right ventricular  size is normal.   3. Highly mobile mass in the right atrium the origin/attachment site is  not well visualized. This appears smaller in size than previously noted  right atrial mass.   4. The mitral valve is normal in structure. Trivial mitral valve  regurgitation. No evidence of mitral stenosis.   5. Tricuspid valve regurgitation is mild to moderate.   6. Highly mobile 0.836 cm x 0.694 cm mass in the RVOT appears to  be  attached to the annulus of the aortic valve above the right cusp, present  on previous study. The aortic valve is normal in structure. Aortic valve  regurgitation is not visualized.  Aortic valve sclerosis is present, with no evidence of aortic valve  stenosis.   7. There is dilatation of the ascending aorta, measuring 39 mm.   8. The inferior vena cava is normal in size with greater than 50%  respiratory variability, suggesting right atrial pressure of 3 mmHg.   Comparison(s): No significant change from prior study.    CTA Abdomen 01/16/22 IMPRESSION: 1. No evidence of active GI bleed. 2. Cholelithiasis with gallbladder wall edema. If there is clinical concern for acute cholecystitis, further evaluation with ultrasound or HIDA scan recommended. 3. Masslike thickening of the gallbladder fundus may represent advanced adenomyomatosis versus neoplasm. Further evaluation with MRI without and with contrast is recommended. 4. 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 Further evaluation with lumbar spine MRI without and with contrast is recommended. 5. There is a 9 mm right middle lobe nodule. Follow-up as per recommendation of prior CT. 6. Aortic Atherosclerosis (ICD10-I70.0).   TEE:  12/22/2021  1. There is a 2.1 x 1.37 mutilobulated mobile mass attached to the RA/IVC junction which has the appearance of thrombus/vegetation in the setting of bacteremia and recently removed dialysis catheter.   2. Left ventricular ejection fraction, by estimation, is 60 to 65%. The  left ventricle has normal function. The left ventricle has no regional  wall motion abnormalities.   3. Right ventricular systolic function is normal. The right ventricular  size is normal.   4. No left atrial/left atrial appendage thrombus was detected.   5. No mitral valve vegetation. The mitral valve is degenerative. Trivial  mitral valve regurgitation. No evidence of mitral stenosis.   6. The aortic valve is normal in  structure. Aortic valve regurgitation is  not visualized. No aortic stenosis is present.   7. The inferior vena cava is normal in size with greater than 50%  respiratory variability, suggesting right atrial pressure of 3 mmHg.    Echo 12/18/21  1. Left ventricular ejection fraction, by estimation, is 60 to 65%. The  left ventricle has normal function. The left ventricle has no regional  wall motion abnormalities. There is moderate hypertrophy of the basal  septum. The rest of the LV segments  demonstrate mild left ventricular hypertrophy. Left ventricular diastolic  parameters are consistent with Grade I diastolic dysfunction (impaired  relaxation).   2. Right ventricular systolic function is normal. The right ventricular  size is normal. There is normal pulmonary artery systolic pressure.   3. The mitral valve leaflets are thickened. There are fibrinous densities  visualized on the anterior mitral valve leaflet on the apical 3C view.  Given underlying bacteremia, findings concerning for vegetation formation  (versus degenerative valve  changes). There is trivial mitral regurgitation and no evidence of valve  destruction. Recommend TEE for further evaluation.   4. The aortic valve is tricuspid. There is mild calcification of the  aortic valve. There is moderate thickening of the aortic valve. Aortic  valve regurgitation is trivial. Aortic valve sclerosis/calcification is  present, without any evidence of aortic  stenosis.   5. Aortic dilatation noted. There is mild dilatation of the ascending  aorta, measuring 40 mm.   6. There is a highly mobile, fibrinous structure visualized in the RA  most likely a eustachian valve or chiari network.   Comparison(s): Compared to prior TTE in 11/2019, the mitral valve leaflets  continue to be thickened, however, there is concern for possible  vegetation on current study. Otherwise, the TR appears mild (previously  mod-to-severe).    Conclusion(s)/Recommendation(s): Findings concerning for mitral valve  vegetation, would recommend a Transesophageal Echocardiogram for  clarification.   EKG:  EKG is not ordered today.    Recent Labs: 06/12/2022: B Natriuretic Peptide 814.2 09/22/2022: Magnesium 1.7 09/23/2022: TSH 3.268 09/27/2022: ALT 8 10/05/2022: BUN 34; Creatinine, Ser 7.36; Hemoglobin 11.2; Platelets 106; Potassium 4.1; Sodium 143  Recent Lipid Panel    Component Value Date/Time   CHOL 188 02/23/2011 0431   TRIG 122 02/23/2011 0431   HDL 84 02/23/2011 0431   CHOLHDL 2.2 Ratio 02/23/2011 0431   VLDL 24 02/23/2011 0431   LDLCALC 80 02/23/2011 0431   Home Medications   Current Meds  Medication Sig   acetaminophen (TYLENOL) 325 MG tablet Take 2 tablets (650 mg total) by mouth 4 (four) times daily. (Patient taking differently: Take 650 mg by mouth 4 (four) times daily as needed for moderate pain.)   albuterol (VENTOLIN HFA) 108 (90 Base) MCG/ACT inhaler Inhale 2  puffs into the lungs every 4 (four) hours as needed for wheezing or shortness of breath.   apixaban (ELIQUIS) 2.5 MG TABS tablet Take 1 tablet (2.5 mg total) by mouth 2 (two) times daily.   B Complex-C-Zn-Folic Acid (DIALYVITE 390 WITH ZINC) 0.8 MG TABS Take by mouth.   fluticasone-salmeterol (ADVAIR DISKUS) 250-50 MCG/ACT AEPB Inhale 1 puff into the lungs in the morning and at bedtime.   gabapentin (NEURONTIN) 100 MG capsule Take 1 capsule (100 mg total) by mouth at bedtime.   hydrALAZINE (APRESOLINE) 100 MG tablet Take 1 tablet by mouth 3 times a day every Monday Wednesday Friday and Sunday Take 1 tablet by mouth 2 times a day every Tuesday Thursday and Saturday   losartan (COZAAR) 25 MG tablet Take 1 tablet (25 mg total) by mouth daily.   Methoxy PEG-Epoetin Beta (MIRCERA IJ) Mircera   metoprolol tartrate (LOPRESSOR) 50 MG tablet Take 1 tablet (50 mg total) by mouth 2 (two) times daily.   pantoprazole (PROTONIX) 40 MG tablet Take 1 tablet (40 mg  total) by mouth daily.   sevelamer (RENAGEL) 800 MG tablet Take 2 tablets by mouth 3 times a day every Tuesday Thursday and Saturday Give 2 tablets by mouth with meals every Monday Wednesday Friday and Sunday   sevelamer carbonate (RENVELA) 800 MG tablet Take by mouth.   vancomycin IVPB Inject 500 mg into the vein Every Tuesday,Thursday,and Saturday with dialysis. Indication:  MRSA discitis/osteomyelitis  First Dose: Yes Last Day of Therapy:  11/15/22 Labs - Sunday/Monday:  CBC/D, BMP, and vancomycin trough. Labs - Thursday:  BMP and vancomycin trough Labs - Every other week:  ESR and CRP Method of administration:Elastomeric Method of administration may be changed at the discretion of the patient and/or caregiver's ability to self-administer the medication ordered.     Review of Systems      All other systems reviewed and are otherwise negative except as noted above.  Physical Exam    VS:  BP 110/74   Pulse 75   Ht 5' 6" (1.676 m)   Wt 126 lb (57.2 kg) Comment: Per patient weighed at dialysis  SpO2 96%   BMI 20.34 kg/m  , BMI Body mass index is 20.34 kg/m.  Wt Readings from Last 3 Encounters:  11/19/22 126 lb (57.2 kg)  09/27/22 114 lb 10.2 oz (52 kg)  09/15/22 114 lb (51.7 kg)     GEN: Well nourished, well developed, in no acute distress. HEENT: normal. Neck: Supple, no JVD, carotid bruits, or masses. Cardiac: RRR, no murmurs, rubs, or gallops. No clubbing, cyanosis, edema.  Radials/PT 2+ and equal bilaterally.  Respiratory:  Respirations regular and unlabored, clear to auscultation bilaterally. GI: Soft, nontender, nondistended. MS: No deformity or atrophy. Skin: Warm and dry, no rash. Neuro:  Strength and sensation are intact. Psych: Normal affect.  Assessment & Plan    Typical atrial flutter -NSR today -on Eliquis 2.49m BID, continue -she is a fall risk and in a wheelchair today, continue PT for strengthening   Right atrial mass -resolved on last echo  10/23 -continue current medication   ESRD on dialysis -she is on dialysis tues, thurs, and sat -no documented episodes of hypotension during sessions -continue current medicaitons  History of MRSA bacteremia -resolved. Hx back in Jan-March of this year -She was on IV antibiotics and is still on IV Vancomycin through 11/15/22 per ID notes  Endocarditis -resolved, most recent echo reviewed with the patient 09/22/22 -follow-up echo 08/2023  Essential hypertension -  well controlled today -continue current medication regimen: Hydralazine 152m TID, cozaar 234mdaily, Lopressor 5070mID  Pneumonia of left lower lobe -follow-up with PCP    Disposition: Follow up 4 months with HeaFreada BergeronD or APP.  Signed, TesElgie CollardA-C 11/19/2022, 2:26 PM [facility]

## 2022-11-19 ENCOUNTER — Ambulatory Visit: Payer: Medicare (Managed Care) | Attending: Physician Assistant | Admitting: Physician Assistant

## 2022-11-19 ENCOUNTER — Encounter: Payer: Self-pay | Admitting: Physician Assistant

## 2022-11-19 VITALS — BP 110/74 | HR 75 | Ht 66.0 in | Wt 126.0 lb

## 2022-11-19 DIAGNOSIS — Z87898 Personal history of other specified conditions: Secondary | ICD-10-CM

## 2022-11-19 DIAGNOSIS — Z992 Dependence on renal dialysis: Secondary | ICD-10-CM

## 2022-11-19 DIAGNOSIS — I5189 Other ill-defined heart diseases: Secondary | ICD-10-CM

## 2022-11-19 DIAGNOSIS — I330 Acute and subacute infective endocarditis: Secondary | ICD-10-CM

## 2022-11-19 DIAGNOSIS — N186 End stage renal disease: Secondary | ICD-10-CM | POA: Diagnosis not present

## 2022-11-19 DIAGNOSIS — I483 Typical atrial flutter: Secondary | ICD-10-CM

## 2022-11-19 DIAGNOSIS — J189 Pneumonia, unspecified organism: Secondary | ICD-10-CM

## 2022-11-19 DIAGNOSIS — I10 Essential (primary) hypertension: Secondary | ICD-10-CM

## 2022-11-19 NOTE — Patient Instructions (Addendum)
Medication Instructions:  Your physician recommends that you continue on your current medications as directed. Please refer to the Current Medication list given to you today.  *If you need a refill on your cardiac medications before your next appointment, please call your pharmacy*   Lab Work: None If you have labs (blood work) drawn today and your tests are completely normal, you will receive your results only by: [place] (if you have MyChart) OR A paper copy in the mail If you have any lab test that is abnormal or we need to change your treatment, we will call you to review the results.   Follow-Up: At [facility], you and your health needs are our priority.  As part of our continuing mission to provide you with exceptional heart care, we have created designated Provider Care Teams.  These Care Teams include your primary Cardiologist (physician) and Advanced Practice Providers (APPs -  Physician Assistants and Nurse Practitioners) who all work together to provide you with the care you need, when you need it.   Your next appointment:   4 month(s)  The format for your next appointment:   In Person  Provider:   Freada Bergeron, MD   Important Information About Sugar

## 2022-11-29 ENCOUNTER — Inpatient Hospital Stay (HOSPITAL_COMMUNITY)
Admission: EM | Admit: 2022-11-29 | Discharge: 2022-12-08 | DRG: 539 | Disposition: A | Discharge status: Skilled Nursing Facility | Payer: Medicare (Managed Care) | Attending: Internal Medicine | Admitting: Internal Medicine

## 2022-11-29 ENCOUNTER — Other Ambulatory Visit (HOSPITAL_COMMUNITY): Payer: Medicare (Managed Care)

## 2022-11-29 ENCOUNTER — Encounter (HOSPITAL_COMMUNITY): Payer: Self-pay

## 2022-11-29 DIAGNOSIS — J45909 Unspecified asthma, uncomplicated: Secondary | ICD-10-CM | POA: Diagnosis present

## 2022-11-29 DIAGNOSIS — Z7901 Long term (current) use of anticoagulants: Secondary | ICD-10-CM

## 2022-11-29 DIAGNOSIS — Z8614 Personal history of Methicillin resistant Staphylococcus aureus infection: Secondary | ICD-10-CM

## 2022-11-29 DIAGNOSIS — Z8249 Family history of ischemic heart disease and other diseases of the circulatory system: Secondary | ICD-10-CM

## 2022-11-29 DIAGNOSIS — E872 Acidosis, unspecified: Secondary | ICD-10-CM | POA: Diagnosis present

## 2022-11-29 DIAGNOSIS — I482 Chronic atrial fibrillation, unspecified: Secondary | ICD-10-CM | POA: Diagnosis present

## 2022-11-29 DIAGNOSIS — M48061 Spinal stenosis, lumbar region without neurogenic claudication: Secondary | ICD-10-CM | POA: Diagnosis present

## 2022-11-29 DIAGNOSIS — D631 Anemia in chronic kidney disease: Secondary | ICD-10-CM | POA: Diagnosis present

## 2022-11-29 DIAGNOSIS — M898X9 Other specified disorders of bone, unspecified site: Secondary | ICD-10-CM | POA: Diagnosis present

## 2022-11-29 DIAGNOSIS — R451 Restlessness and agitation: Secondary | ICD-10-CM | POA: Diagnosis not present

## 2022-11-29 DIAGNOSIS — I120 Hypertensive chronic kidney disease with stage 5 chronic kidney disease or end stage renal disease: Secondary | ICD-10-CM | POA: Diagnosis present

## 2022-11-29 DIAGNOSIS — Z8673 Personal history of transient ischemic attack (TIA), and cerebral infarction without residual deficits: Secondary | ICD-10-CM

## 2022-11-29 DIAGNOSIS — R051 Acute cough: Secondary | ICD-10-CM

## 2022-11-29 DIAGNOSIS — Z9049 Acquired absence of other specified parts of digestive tract: Secondary | ICD-10-CM

## 2022-11-29 DIAGNOSIS — R509 Fever, unspecified: Principal | ICD-10-CM

## 2022-11-29 DIAGNOSIS — Z800 Family history of malignant neoplasm of digestive organs: Secondary | ICD-10-CM

## 2022-11-29 DIAGNOSIS — N186 End stage renal disease: Secondary | ICD-10-CM

## 2022-11-29 DIAGNOSIS — M4626 Osteomyelitis of vertebra, lumbar region: Secondary | ICD-10-CM | POA: Diagnosis not present

## 2022-11-29 DIAGNOSIS — R4182 Altered mental status, unspecified: Secondary | ICD-10-CM | POA: Diagnosis not present

## 2022-11-29 DIAGNOSIS — Z1152 Encounter for screening for COVID-19: Secondary | ICD-10-CM

## 2022-11-29 DIAGNOSIS — Z79899 Other long term (current) drug therapy: Secondary | ICD-10-CM

## 2022-11-29 DIAGNOSIS — R7982 Elevated C-reactive protein (CRP): Secondary | ICD-10-CM | POA: Diagnosis present

## 2022-11-29 DIAGNOSIS — M25561 Pain in right knee: Secondary | ICD-10-CM | POA: Diagnosis not present

## 2022-11-29 DIAGNOSIS — Z801 Family history of malignant neoplasm of trachea, bronchus and lung: Secondary | ICD-10-CM

## 2022-11-29 DIAGNOSIS — M109 Gout, unspecified: Secondary | ICD-10-CM | POA: Diagnosis present

## 2022-11-29 DIAGNOSIS — M8668 Other chronic osteomyelitis, other site: Secondary | ICD-10-CM

## 2022-11-29 DIAGNOSIS — I4510 Unspecified right bundle-branch block: Secondary | ICD-10-CM | POA: Diagnosis present

## 2022-11-29 DIAGNOSIS — K219 Gastro-esophageal reflux disease without esophagitis: Secondary | ICD-10-CM | POA: Diagnosis present

## 2022-11-29 DIAGNOSIS — D61818 Other pancytopenia: Secondary | ICD-10-CM | POA: Diagnosis not present

## 2022-11-29 DIAGNOSIS — I4892 Unspecified atrial flutter: Secondary | ICD-10-CM | POA: Diagnosis present

## 2022-11-29 DIAGNOSIS — Z781 Physical restraint status: Secondary | ICD-10-CM

## 2022-11-29 DIAGNOSIS — Z8619 Personal history of other infectious and parasitic diseases: Secondary | ICD-10-CM

## 2022-11-29 DIAGNOSIS — Z7951 Long term (current) use of inhaled steroids: Secondary | ICD-10-CM

## 2022-11-29 DIAGNOSIS — M4646 Discitis, unspecified, lumbar region: Secondary | ICD-10-CM | POA: Diagnosis present

## 2022-11-29 DIAGNOSIS — R34 Anuria and oliguria: Secondary | ICD-10-CM | POA: Diagnosis present

## 2022-11-29 DIAGNOSIS — G8929 Other chronic pain: Secondary | ICD-10-CM | POA: Diagnosis present

## 2022-11-29 DIAGNOSIS — Z992 Dependence on renal dialysis: Secondary | ICD-10-CM

## 2022-11-29 DIAGNOSIS — I480 Paroxysmal atrial fibrillation: Secondary | ICD-10-CM | POA: Diagnosis present

## 2022-11-29 LAB — I-STAT CHEM 8, ED
BUN: 36 mg/dL — ABNORMAL HIGH (ref 8–23)
Calcium, Ion: 1.18 mmol/L (ref 1.15–1.40)
Chloride: 99 mmol/L (ref 98–111)
Creatinine, Ser: 7.7 mg/dL — ABNORMAL HIGH (ref 0.44–1.00)
Glucose, Bld: 95 mg/dL (ref 70–99)
HCT: 32 % — ABNORMAL LOW (ref 36.0–46.0)
Hemoglobin: 10.9 g/dL — ABNORMAL LOW (ref 12.0–15.0)
Potassium: 3.5 mmol/L (ref 3.5–5.1)
Sodium: 137 mmol/L (ref 135–145)
TCO2: 27 mmol/L (ref 22–32)

## 2022-11-29 LAB — CBC WITH DIFFERENTIAL/PLATELET
Abs Immature Granulocytes: 0.01 10*3/uL (ref 0.00–0.07)
Basophils Absolute: 0 10*3/uL (ref 0.0–0.1)
Basophils Relative: 0 %
Eosinophils Absolute: 0.1 10*3/uL (ref 0.0–0.5)
Eosinophils Relative: 2 %
HCT: 32.9 % — ABNORMAL LOW (ref 36.0–46.0)
Hemoglobin: 10 g/dL — ABNORMAL LOW (ref 12.0–15.0)
Immature Granulocytes: 0 %
Lymphocytes Relative: 10 %
Lymphs Abs: 0.7 10*3/uL (ref 0.7–4.0)
MCH: 33.2 pg (ref 26.0–34.0)
MCHC: 30.4 g/dL (ref 30.0–36.0)
MCV: 109.3 fL — ABNORMAL HIGH (ref 80.0–100.0)
Monocytes Absolute: 0.7 10*3/uL (ref 0.1–1.0)
Monocytes Relative: 10 %
Neutro Abs: 5.3 10*3/uL (ref 1.7–7.7)
Neutrophils Relative %: 78 %
Platelets: 125 10*3/uL — ABNORMAL LOW (ref 150–400)
RBC: 3.01 MIL/uL — ABNORMAL LOW (ref 3.87–5.11)
RDW: 15 % (ref 11.5–15.5)
WBC: 6.8 10*3/uL (ref 4.0–10.5)
nRBC: 0 % (ref 0.0–0.2)

## 2022-11-29 LAB — COMPREHENSIVE METABOLIC PANEL
ALT: 8 U/L (ref 0–44)
AST: 15 U/L (ref 15–41)
Albumin: 2.9 g/dL — ABNORMAL LOW (ref 3.5–5.0)
Alkaline Phosphatase: 48 U/L (ref 38–126)
Anion gap: 16 — ABNORMAL HIGH (ref 5–15)
BUN: 38 mg/dL — ABNORMAL HIGH (ref 8–23)
CO2: 25 mmol/L (ref 22–32)
Calcium: 9.6 mg/dL (ref 8.9–10.3)
Chloride: 96 mmol/L — ABNORMAL LOW (ref 98–111)
Creatinine, Ser: 6.95 mg/dL — ABNORMAL HIGH (ref 0.44–1.00)
GFR, Estimated: 6 mL/min — ABNORMAL LOW (ref >60)
Glucose, Bld: 100 mg/dL — ABNORMAL HIGH (ref 70–99)
Potassium: 3.5 mmol/L (ref 3.5–5.1)
Sodium: 137 mmol/L (ref 135–145)
Total Bilirubin: 0.3 mg/dL (ref 0.3–1.2)
Total Protein: 6.9 g/dL (ref 6.5–8.1)

## 2022-11-29 LAB — RESP PANEL BY RT-PCR (RSV, FLU A&B, COVID)  RVPGX2
Influenza A by PCR: NEGATIVE
Influenza B by PCR: NEGATIVE
Resp Syncytial Virus by PCR: NEGATIVE
SARS Coronavirus 2 by RT PCR: NEGATIVE

## 2022-11-29 LAB — LACTIC ACID, PLASMA: Lactic Acid, Venous: 2.7 mmol/L (ref 0.5–1.9)

## 2022-11-29 MED ORDER — LACTATED RINGERS IV BOLUS
500.0000 mL | Freq: Once | INTRAVENOUS | Status: AC
  Administered 2022-11-29: 500 mL via INTRAVENOUS

## 2022-11-29 MED ORDER — ACETAMINOPHEN 500 MG PO TABS
1000.0000 mg | ORAL_TABLET | Freq: Once | ORAL | Status: AC
  Administered 2022-11-29: 1000 mg via ORAL
  Filled 2022-11-29: qty 2

## 2022-11-29 MED ORDER — VANCOMYCIN HCL IN DEXTROSE 1-5 GM/200ML-% IV SOLN
1000.0000 mg | Freq: Once | INTRAVENOUS | Status: AC
  Administered 2022-11-29: 1000 mg via INTRAVENOUS
  Filled 2022-11-29: qty 200

## 2022-11-29 MED ORDER — SODIUM CHLORIDE 0.9 % IV SOLN
1.0000 g | Freq: Once | INTRAVENOUS | Status: AC
  Administered 2022-11-29: 1 g via INTRAVENOUS
  Filled 2022-11-29: qty 10

## 2022-11-29 NOTE — ED Notes (Signed)
Patient had a bowel movement, is normally continent per EMS. Patient cleaned up at this time.

## 2022-11-29 NOTE — ED Notes (Signed)
Patient transported to CT in NAD.

## 2022-11-29 NOTE — ED Provider Notes (Signed)
[facility] EMERGENCY DEPARTMENT Provider Note   CSN: [id number] Arrival date & time: 11/29/22  2004     History  Chief Complaint  Patient presents with   Altered Mental Status    BIB from home by EMS, Dialysis pt, tues, thurs, sat, apparently is not at her baseline, A&Ox2-3, baseline is ambulatory and lives alone at home independently, apparently was at baseline yesterday, warm to touch, [place] is a 71 y.o. female.  Past medical history of atrial flutter with RVR, anemia, asthma, ESRD on HD TTS, hypertension, hyponatremia, hyperparathyroidism, lumbar septic discitis, endocarditis with MRSA bacteremia, Candida esophagitis, hepatitis C.  She presents to the emergency department today from home by EMS, she is not at her baseline.  At normal she is ambulatory and lives alone at home and reportedly was at her baseline yesterday.  Family checks in on her, when they were with her today, they noticed that things are little bit different.  She was not fully oriented to person place and situation.  She was answering questions a little bit slowly.  She herself feels a little bit off, feels just very weak and tired.  States that everything, started last night and progressed into today.  She denies any fevers, chills, night sweats, cough, shortness of breath, chest pain, abdominal pain, nausea, vomiting, diarrhea.  Does have some chronic back pain, worsened when she sits upright.   Altered Mental Status      Home Medications Prior to Admission medications   Medication Sig Start Date End Date Taking? Authorizing Provider  acetaminophen (TYLENOL) 325 MG tablet Take 2 tablets (650 mg total) by mouth 4 (four) times daily. Patient taking differently: Take 650 mg by mouth 4 (four) times daily as needed for moderate pain. 06/30/22   Elgergawy, Silver Huguenin, MD  albuterol (VENTOLIN HFA) 108 (90 Base) MCG/ACT inhaler Inhale 2 puffs into the lungs every 4 (four) hours as  needed for wheezing or shortness of breath. 10/06/22   Ripley Fraise, MD  apixaban (ELIQUIS) 2.5 MG TABS tablet Take 1 tablet (2.5 mg total) by mouth 2 (two) times daily. 10/06/22 11/19/22  Ripley Fraise, MD  B Complex-C-Zn-Folic Acid (DIALYVITE 174 WITH ZINC) 0.8 MG TABS Take by mouth. 11/16/22   [provider]  fluticasone-salmeterol (ADVAIR DISKUS) 250-50 MCG/ACT AEPB Inhale 1 puff into the lungs in the morning and at bedtime. 10/06/22   Ripley Fraise, MD  gabapentin (NEURONTIN) 100 MG capsule Take 1 capsule (100 mg total) by mouth at bedtime. 10/15/22   Prudencio Pair T, MD  hydrALAZINE (APRESOLINE) 100 MG tablet Take 1 tablet by mouth 3 times a day every Monday Wednesday Friday and Sunday Take 1 tablet by mouth 2 times a day every Tuesday Thursday and Saturday 10/06/22   Ripley Fraise, MD  losartan (COZAAR) 25 MG tablet Take 1 tablet (25 mg total) by mouth daily. 10/06/22   Ripley Fraise, MD  Methoxy PEG-Epoetin Beta (MIRCERA IJ) Mircera 10/09/22 10/08/23  [provider]  metoprolol tartrate (LOPRESSOR) 50 MG tablet Take 1 tablet (50 mg total) by mouth 2 (two) times daily. 10/06/22   Ripley Fraise, MD  pantoprazole (PROTONIX) 40 MG tablet Take 1 tablet (40 mg total) by mouth daily. 10/06/22   Ripley Fraise, MD  sevelamer (RENAGEL) 800 MG tablet Take 2 tablets by mouth 3 times a day every Tuesday Thursday and Saturday Give 2 tablets by mouth with meals every Monday Wednesday Friday and Sunday 10/06/22  Ripley Fraise, MD  sevelamer carbonate (RENVELA) 800 MG tablet Take by mouth. 10/05/22   [provider]  vancomycin IVPB Inject 500 mg into the vein Every Tuesday,Thursday,and Saturday with dialysis. Indication:  MRSA discitis/osteomyelitis  First Dose: Yes Last Day of Therapy:  11/15/22 Labs - Sunday/Monday:  CBC/D, BMP, and vancomycin trough. Labs - Thursday:  BMP and vancomycin trough Labs - Every other week:  ESR and CRP Method of  administration:Elastomeric Method of administration may be changed at the discretion of the patient and/or caregiver's ability to self-administer the medication ordered. 10/02/22   Antonieta Pert, MD      Allergies    Shellfish allergy and Other    Review of Systems   Review of Systems  Physical Exam Updated Vital Signs BP (!) 169/93   Pulse 73   Temp 99.6 F (37.6 C) (Oral)   Resp 18   SpO2 98%  Physical Exam Vitals and nursing note reviewed.  Constitutional:      General: She is not in acute distress.    Appearance: She is well-developed. She is not ill-appearing or diaphoretic.  HENT:     Head: Normocephalic and atraumatic.     Right Ear: External ear normal.     Left Ear: External ear normal.     Nose: Nose normal.     Mouth/Throat:     Mouth: Mucous membranes are dry.     Pharynx: Oropharynx is clear.  Eyes:     Conjunctiva/sclera: Conjunctivae normal.  Cardiovascular:     Rate and Rhythm: Normal rate and regular rhythm.     Pulses: Normal pulses.     Heart sounds: Normal heart sounds. No murmur heard. Pulmonary:     Effort: Pulmonary effort is normal. No respiratory distress.     Breath sounds: Normal breath sounds.  Abdominal:     General: Abdomen is flat.     Palpations: Abdomen is soft.     Tenderness: There is no abdominal tenderness. There is no guarding or rebound.  Musculoskeletal:        General: No swelling or tenderness.     Cervical back: Neck supple. No tenderness.     Right lower leg: No edema.     Left lower leg: No edema.  Skin:    General: Skin is warm and dry.     Capillary Refill: Capillary refill takes less than 2 seconds.  Neurological:     General: No focal deficit present.     Mental Status: She is alert and oriented to person, place, and time.     GCS: GCS eye subscore is 4. GCS verbal subscore is 5. GCS motor subscore is 6.     Cranial Nerves: No cranial nerve deficit.     Sensory: Sensation is intact. No sensory deficit.     Motor:  Motor function is intact. No weakness, tremor or abnormal muscle tone.  Psychiatric:        Mood and Affect: Mood normal.     ED Results / Procedures / Treatments   Labs (all labs ordered are listed, but only abnormal results are displayed) Labs Reviewed  CBC WITH DIFFERENTIAL/PLATELET - Abnormal; Notable for the following components:      Result Value   RBC 3.01 (*)    Hemoglobin 10.0 (*)    HCT 32.9 (*)    MCV 109.3 (*)    Platelets 125 (*)    All other components within normal limits  COMPREHENSIVE METABOLIC PANEL - Abnormal; Notable  for the following components:   Chloride 96 (*)    Glucose, Bld 100 (*)    BUN 38 (*)    Creatinine, Ser 6.95 (*)    Albumin 2.9 (*)    GFR, Estimated 6 (*)    Anion gap 16 (*)    All other components within normal limits  LACTIC ACID, PLASMA - Abnormal; Notable for the following components:   Lactic Acid, Venous 2.7 (*)    All other components within normal limits  I-STAT CHEM 8, ED - Abnormal; Notable for the following components:   BUN 36 (*)    Creatinine, Ser 7.70 (*)    Hemoglobin 10.9 (*)    HCT 32.0 (*)    All other components within normal limits  RESP PANEL BY RT-PCR (RSV, FLU A&B, COVID)  RVPGX2  CULTURE, BLOOD (ROUTINE X 2)  CULTURE, BLOOD (ROUTINE X 2)  LACTIC ACID, PLASMA  URINALYSIS, ROUTINE W REFLEX MICROSCOPIC    EKG None  Radiology No results found.  Procedures Procedures    Medications Ordered in ED Medications  ceFEPIme (MAXIPIME) 1 g in sodium chloride 0.9 % 100 mL IVPB (1 g Intravenous New Bag/Given 11/29/22 2334)  acetaminophen (TYLENOL) tablet 1,000 mg (1,000 mg Oral Given 11/29/22 2109)  lactated ringers bolus 500 mL (0 mLs Intravenous Stopped 11/29/22 2331)  vancomycin (VANCOCIN) IVPB 1000 mg/200 mL premix (1,000 mg Intravenous New Bag/Given 11/29/22 2241)    ED Course/ Medical Decision Making/ A&P                           Medical Decision Making Problems Addressed: Fever in adult: acute  illness or injury that poses a threat to life or bodily functions  Amount and/or Complexity of Data Reviewed Independent Historian: EMS Labs: ordered. Decision-making details documented in ED Course. Radiology: ordered. ECG/medicine tests: ordered and independent interpretation performed. Decision-making details documented in ED Course.  Risk OTC drugs. Prescription drug management. Decision regarding hospitalization.   This is a 71 y.o. female who presents to the emergency department with fever and familial concern for altered mental status. History is obtained from patient and EMS. Medical history reviewed in EMR.  Past medical/surgical history that increases complexity of ED encounter:  atrial flutter with RVR, anemia, asthma, ESRD on HD TTS, hypertension, hyponatremia, hyperparathyroidism, lumbar septic discitis, endocarditis with MRSA bacteremia, Candida esophagitis, hepatitis C  Initial Assessment: Patient presenting with female concern for altered mental status.  She is a dialysis patient and receives all of her dialysis, does not miss any sessions.  She started feeling poor yesterday, felt warm and overall just, weak and fatigued.  She presents emergency department today after family found her less oriented and a little more confused than she normally is.  On arrival she was found to be febrile.  Otherwise her vital signs are normal, she does not have hypotension, respiratory status is good, and she has normal heart rate.  No other SIRS criteria triggering for sepsis activation.  She does have a pretty significant and concerning history of MRSA bacteremia back in October of this year.  Additionally she is a hemodialysis patient with frequent IV access.  I am particularly concerned about sepsis or bacteremia, especially in the fact that she does not have any localizing symptoms on exam.  Per my interpretation, her altered mental status is very minimal.  She is fully oriented, but does have  some tangential thought and a little bit of intermittent forgetfulness.  She does not have any nuchal rigidity or neck stiffness, she does have chronic back pain which is no different than prior.  No spinal midline tenderness to palpation, she does have a prior history of lumbar spine discitis and osteomyelitis.  She does not have any lower extremity deficits.   Initial Plan: Laboratory evaluation with CBC, CMP, COVID/flu, i-STAT Chem-8, lactic acid, blood cultures ECG Chest x-ray and head CT Tylenol for fever  Interpretation of Diagnostics: I personally reviewed the labs, ECG and my interpretation is as follows: Labs are relatively unremarkable, no acute significant findings on CBC or CMP.  Her COVID and flu is negative.  Her initial lactic acid is 2.7.  This combined with the fever does trigger sepsis criteria.  She already has had blood cultures drawn.  Will add on antibiotics with vancomycin and cefepime.  Additionally will give her 500 cc of LR, however due to her end-stage renal disease status, do not feel that the full 30 cc/kg of IV fluids is indicated and will cause more harm than good at this time.  She is not hypotensive.  At the time of signout to the night team, chest x-ray and head CT are still pending.  Patient will need to be admitted to the hospital for follow-up blood cultures and further treatment.  Oncoming team to handle this and discuss care management with inpatient providers  MDM generated using voice dictation software and may contain dictation errors. Please contact me for any clarification or with any questions.          Final Clinical Impression(s) / ED Diagnoses Final diagnoses:  Fever in adult    Rx / DC Orders ED Discharge Orders     None         Phyllis Ginger, MD 11/29/22 2344    Margette Fast, MD 01/03/23 [phone]

## 2022-11-29 NOTE — ED Notes (Signed)
Primary line blew, MD to retry placing new line.

## 2022-11-30 ENCOUNTER — Emergency Department (HOSPITAL_COMMUNITY): Payer: Medicare (Managed Care)

## 2022-11-30 ENCOUNTER — Other Ambulatory Visit: Payer: Self-pay

## 2022-11-30 ENCOUNTER — Inpatient Hospital Stay (HOSPITAL_COMMUNITY): Payer: Medicare (Managed Care)

## 2022-11-30 DIAGNOSIS — R34 Anuria and oliguria: Secondary | ICD-10-CM | POA: Diagnosis present

## 2022-11-30 DIAGNOSIS — D631 Anemia in chronic kidney disease: Secondary | ICD-10-CM | POA: Diagnosis present

## 2022-11-30 DIAGNOSIS — M898X9 Other specified disorders of bone, unspecified site: Secondary | ICD-10-CM | POA: Diagnosis present

## 2022-11-30 DIAGNOSIS — Z1152 Encounter for screening for COVID-19: Secondary | ICD-10-CM | POA: Diagnosis not present

## 2022-11-30 DIAGNOSIS — M4626 Osteomyelitis of vertebra, lumbar region: Secondary | ICD-10-CM | POA: Diagnosis present

## 2022-11-30 DIAGNOSIS — M109 Gout, unspecified: Secondary | ICD-10-CM | POA: Diagnosis present

## 2022-11-30 DIAGNOSIS — I482 Chronic atrial fibrillation, unspecified: Secondary | ICD-10-CM | POA: Diagnosis present

## 2022-11-30 DIAGNOSIS — M4646 Discitis, unspecified, lumbar region: Secondary | ICD-10-CM | POA: Diagnosis not present

## 2022-11-30 DIAGNOSIS — R051 Acute cough: Secondary | ICD-10-CM | POA: Diagnosis not present

## 2022-11-30 DIAGNOSIS — G8929 Other chronic pain: Secondary | ICD-10-CM | POA: Diagnosis present

## 2022-11-30 DIAGNOSIS — R404 Transient alteration of awareness: Secondary | ICD-10-CM | POA: Diagnosis not present

## 2022-11-30 DIAGNOSIS — K219 Gastro-esophageal reflux disease without esophagitis: Secondary | ICD-10-CM | POA: Diagnosis present

## 2022-11-30 DIAGNOSIS — I480 Paroxysmal atrial fibrillation: Secondary | ICD-10-CM | POA: Diagnosis present

## 2022-11-30 DIAGNOSIS — R451 Restlessness and agitation: Secondary | ICD-10-CM | POA: Diagnosis not present

## 2022-11-30 DIAGNOSIS — E872 Acidosis, unspecified: Secondary | ICD-10-CM | POA: Diagnosis present

## 2022-11-30 DIAGNOSIS — R4182 Altered mental status, unspecified: Secondary | ICD-10-CM | POA: Diagnosis present

## 2022-11-30 DIAGNOSIS — Z781 Physical restraint status: Secondary | ICD-10-CM | POA: Diagnosis not present

## 2022-11-30 DIAGNOSIS — I120 Hypertensive chronic kidney disease with stage 5 chronic kidney disease or end stage renal disease: Secondary | ICD-10-CM | POA: Diagnosis present

## 2022-11-30 DIAGNOSIS — M545 Low back pain, unspecified: Secondary | ICD-10-CM | POA: Diagnosis not present

## 2022-11-30 DIAGNOSIS — R7982 Elevated C-reactive protein (CRP): Secondary | ICD-10-CM | POA: Diagnosis present

## 2022-11-30 DIAGNOSIS — R509 Fever, unspecified: Secondary | ICD-10-CM

## 2022-11-30 DIAGNOSIS — I4892 Unspecified atrial flutter: Secondary | ICD-10-CM | POA: Diagnosis present

## 2022-11-30 DIAGNOSIS — M8668 Other chronic osteomyelitis, other site: Secondary | ICD-10-CM | POA: Diagnosis not present

## 2022-11-30 DIAGNOSIS — Z992 Dependence on renal dialysis: Secondary | ICD-10-CM | POA: Diagnosis not present

## 2022-11-30 DIAGNOSIS — D61818 Other pancytopenia: Secondary | ICD-10-CM | POA: Diagnosis not present

## 2022-11-30 DIAGNOSIS — I4510 Unspecified right bundle-branch block: Secondary | ICD-10-CM | POA: Diagnosis present

## 2022-11-30 DIAGNOSIS — M25561 Pain in right knee: Secondary | ICD-10-CM | POA: Diagnosis not present

## 2022-11-30 DIAGNOSIS — J45909 Unspecified asthma, uncomplicated: Secondary | ICD-10-CM | POA: Diagnosis present

## 2022-11-30 DIAGNOSIS — N186 End stage renal disease: Secondary | ICD-10-CM | POA: Diagnosis present

## 2022-11-30 LAB — LACTIC ACID, PLASMA
Lactic Acid, Venous: >9 mmol/L (ref 0.5–1.9)
Lactic Acid, Venous: 1.3 mmol/L (ref 0.5–1.9)

## 2022-11-30 LAB — CBC
HCT: 28.4 % — ABNORMAL LOW (ref 36.0–46.0)
Hemoglobin: 9.4 g/dL — ABNORMAL LOW (ref 12.0–15.0)
MCH: 34.7 pg — ABNORMAL HIGH (ref 26.0–34.0)
MCHC: 33.1 g/dL (ref 30.0–36.0)
MCV: 104.8 fL — ABNORMAL HIGH (ref 80.0–100.0)
Platelets: 72 10*3/uL — ABNORMAL LOW (ref 150–400)
RBC: 2.71 MIL/uL — ABNORMAL LOW (ref 3.87–5.11)
RDW: 14.8 % (ref 11.5–15.5)
WBC: 5.5 10*3/uL (ref 4.0–10.5)
nRBC: 0 % (ref 0.0–0.2)

## 2022-11-30 LAB — C-REACTIVE PROTEIN: CRP: 16.2 mg/dL — ABNORMAL HIGH (ref <1.0)

## 2022-11-30 LAB — HEPATITIS B SURFACE ANTIGEN: Hepatitis B Surface Ag: NONREACTIVE

## 2022-11-30 LAB — MRSA NEXT GEN BY PCR, NASAL: MRSA by PCR Next Gen: DETECTED — AB

## 2022-11-30 LAB — PHOSPHORUS: Phosphorus: 5.3 mg/dL — ABNORMAL HIGH (ref 2.5–4.6)

## 2022-11-30 MED ORDER — SODIUM CHLORIDE 0.9 % IV SOLN
1.0000 g | INTRAVENOUS | Status: DC
  Administered 2022-11-30 – 2022-12-01 (×2): 1 g via INTRAVENOUS
  Filled 2022-11-30 (×3): qty 10

## 2022-11-30 MED ORDER — HEPARIN SODIUM (PORCINE) 5000 UNIT/ML IJ SOLN
5000.0000 [IU] | Freq: Three times a day (TID) | INTRAMUSCULAR | Status: DC
  Administered 2022-11-30: 5000 [IU] via SUBCUTANEOUS
  Filled 2022-11-30: qty 1

## 2022-11-30 MED ORDER — CHLORHEXIDINE GLUCONATE CLOTH 2 % EX PADS
6.0000 | MEDICATED_PAD | Freq: Every day | CUTANEOUS | Status: DC
  Administered 2022-12-02 – 2022-12-08 (×6): 6 via TOPICAL

## 2022-11-30 MED ORDER — SEVELAMER CARBONATE 800 MG PO TABS
1600.0000 mg | ORAL_TABLET | Freq: Three times a day (TID) | ORAL | Status: DC
  Administered 2022-11-30 – 2022-12-08 (×18): 1600 mg via ORAL
  Filled 2022-11-30 (×17): qty 2

## 2022-11-30 MED ORDER — MUPIROCIN 2 % EX OINT
1.0000 | TOPICAL_OINTMENT | Freq: Two times a day (BID) | CUTANEOUS | Status: AC
  Administered 2022-11-30 – 2022-12-04 (×7): 1 via NASAL
  Filled 2022-11-30 (×3): qty 22

## 2022-11-30 MED ORDER — ACETAMINOPHEN 500 MG PO TABS
1000.0000 mg | ORAL_TABLET | Freq: Four times a day (QID) | ORAL | Status: DC | PRN
  Administered 2022-11-30 – 2022-12-05 (×5): 1000 mg via ORAL
  Filled 2022-11-30 (×4): qty 2

## 2022-11-30 MED ORDER — LACTATED RINGERS IV BOLUS: 1000.0000 mL | Freq: Once | INTRAVENOUS | Status: DC

## 2022-11-30 MED ORDER — VANCOMYCIN HCL 500 MG/100ML IV SOLN
500.0000 mg | INTRAVENOUS | Status: DC
  Filled 2022-11-30: qty 100

## 2022-11-30 MED ORDER — HYDROMORPHONE HCL 2 MG PO TABS
1.0000 mg | ORAL_TABLET | Freq: Four times a day (QID) | ORAL | Status: DC | PRN
  Administered 2022-11-30 – 2022-12-03 (×4): 1 mg via ORAL
  Filled 2022-11-30 (×4): qty 1

## 2022-11-30 MED ORDER — APIXABAN 2.5 MG PO TABS
2.5000 mg | ORAL_TABLET | Freq: Two times a day (BID) | ORAL | Status: DC
  Administered 2022-11-30 – 2022-12-08 (×17): 2.5 mg via ORAL
  Filled 2022-11-30 (×19): qty 1

## 2022-11-30 MED ORDER — PANTOPRAZOLE SODIUM 40 MG PO TBEC
40.0000 mg | DELAYED_RELEASE_TABLET | Freq: Every day | ORAL | Status: DC
  Administered 2022-11-30 – 2022-12-08 (×8): 40 mg via ORAL
  Filled 2022-11-30 (×9): qty 1

## 2022-11-30 MED ORDER — HYDRALAZINE HCL 50 MG PO TABS
100.0000 mg | ORAL_TABLET | Freq: Three times a day (TID) | ORAL | Status: DC
  Administered 2022-11-30 – 2022-12-04 (×11): 100 mg via ORAL
  Filled 2022-11-30 (×11): qty 2

## 2022-11-30 NOTE — ED Notes (Signed)
Patient had another bowel movement at this time. Was cleaned up and repositioned at this time.

## 2022-11-30 NOTE — Progress Notes (Signed)
Pharmacy Antibiotic Note  Diamond [name] is a 71 y.o. female admitted on 11/29/2022 with  Osteomyelitis .  Pharmacy has been consulted for vancomycin and cefepime dosing.  Hx ESRD. WBC 5.6, LA >9 down to 1.3. Afebrile. Has received recent antibiotic treatments for hx septic discitis. Received 1 g IV cefepime and vancomycin on 12/25 (dose of vancomycin on 12/25'@2241'$ ).   Plan: Will follow up HD schedule to start vancomycin IV 500 mg qHD  Continue cefepime 1g IV every 24 hours Notes indicate plan for ID consult - will follow up their recommendations     Temp (24hrs), Avg:99.8 F (37.7 C), Min:98.2 F (36.8 C), Max:101.5 F (38.6 C)  Recent Labs  Lab 11/29/22 2100 11/29/22 2110 11/29/22 2320 11/30/22 0120 11/30/22 0536  WBC 6.8  --   --   --  5.5  CREATININE 6.95* 7.70*  --   --   --   LATICACIDVEN 2.7*  --  >9.0* 1.3  --     Estimated Creatinine Clearance: 5.9 mL/min (A) (by C-G formula based on SCr of 7.7 mg/dL (H)).    Allergies  Allergen Reactions   Shellfish Allergy Other (See Comments)    Unknown reaction   Shrimp    Other Rash    Anesthesia - facial swelling    Antimicrobials this admission: Vancomycin 12/25 >>  Cefepime 12/25 >>   Dose adjustments this admission: N/A  Microbiology results: 12/25 BCx: sent 12/25 COVID/Flu/RSV PCR: neg  Thank you for allowing pharmacy to be a part of this patient's care.  Antonietta Jewel, PharmD, [place] Clinical Pharmacist  Phone: [phone] 11/30/2022 8:08 AM  Please check AMION for all [facility] phone numbers After 10:00 PM, call [facility] [place] [phone]

## 2022-11-30 NOTE — Progress Notes (Signed)
OT Cancellation Note  Patient Details Name: Diamond [name] MRN: [id number] DOB: 1951/10/27   Cancelled Treatment:    Reason Eval/Treat Not Completed: Patient at procedure or test/ unavailable (MRI)  Elliot Cousin 11/30/2022, 8:35 AM

## 2022-11-30 NOTE — Hospital Course (Addendum)
AMS, resolved Fever of unknown source, resolved Patient presented with AMS, resolved in the ED, and fever on admission. Given history of recurrent bacteremia 2/2 lumbar spine osteomyelitis and worsening lumbar pain in the past 2-3 weeks, MRI lumbar spine was obtained. No evidence of worsening osteomyelitis at L2-L3, L4-5; however, worsening paravertebral inflammatory changes concerning for infection.No leukocytosis since admission. ESR and CRP elevated to 75 snf 16.2, respectively. U/A clean. Blood and urine cultures sent, no growth to this date. Patient was started on Vancomycin and cefepime on 12/25. Pain was managed with PO tylenol. ID was consulted on 12/27; awaiting their recommendations.   ESRD, HD on TTS MBD CKD Anemia of chronic disease Last HD 12/23. Nephrology was consulted and patient was dialyzed while inpatient, off schedule. Patient remained normotensive. Hb 10 and mildly hypercalcemic at 10.4; Nephrology managed during this hospitalization  Atrial fibrillation Remained rate controlled during this hospitalization. Betablocker was held in setting of low HR  PT/OT - recommended SNF with short term rehab. CSW spoke with daughter who is in discussions with other family members to choose between SNF vs home health PT

## 2022-11-30 NOTE — Consult Note (Signed)
Renal Service Consult Note [facility]  Diamond [name] 11/30/2022 [name], MD Requesting Physician: Dr. Dareen Piano  Reason for Consult: ESRD pt w/ AMS HPI: The patient is a 71 y.o. year-old w/ PMH as below who presented to ED for altered mental status. Per family has been confused for 1 day. In ED pt was oriented and alert, c/o back pain for about 2 wks. In ED Hb and WBC were stable, K ok, B/Cr okay, low alb, normal LFT"s. CTH neg. Pt had fever in the ED to 101.5. Pt admitted for fever and AMS w/ lower back pain for 2 wks. We are asked to see for dialysis.   Pt seen in room.  Says she hasn't walked since her GB was removed. She can't remember when her GB was removed. Vague historian. No SOB or cough, no abd pain.  +lower back pain.    ROS - denies CP, no joint pain, no HA, no blurry vision, no rash, no diarrhea, no nausea/ vomiting, no dysuria, no difficulty voiding   Past Medical History  Past Medical History:  Diagnosis Date   Anemia    Asthma    Atrial flutter with rapid ventricular response ([place]) 12/25/2021   Chronic kidney disease    dialysis Tues Thurs Sat   Gout    Hypertension    Past Surgical History  Past Surgical History:  Procedure Laterality Date   AV FISTULA PLACEMENT Left 12/23/2021   Procedure: LEFT ARM BRACHIOBASILIC VEIN ARTERIOVENOUS (AV) FISTULA CREATION;  Surgeon: [name], MD;  Location: [facility];  Service: Vascular;  Laterality: Left;   [place] Left 03/08/2022   Procedure: LEFT SECOND STAGE BASILIC VEIN TRANSPOSITION;  Surgeon: [name], MD;  Location: [facility] OR;  Service: Vascular;  Laterality: Left;   BIOPSY  01/19/2022   Procedure: BIOPSY;  Surgeon: Lavena Bullion, DO;  Location: [facility] ENDOSCOPY;  Service: Gastroenterology;;   [place] N/A 06/25/2022   Procedure: OPEN CHOLECYSTECTOMY;  Surgeon: Dwan Bolt, MD;  Location: [facility];  Service: General;  Laterality: N/A;    COLONOSCOPY Left 01/19/2022   Procedure: COLONOSCOPY;  Surgeon: Lavena Bullion, DO;  Location: [facility];  Service: Gastroenterology;  Laterality: Left;   ESOPHAGOGASTRODUODENOSCOPY Left 01/19/2022   Procedure: ESOPHAGOGASTRODUODENOSCOPY (EGD);  Surgeon: Lavena Bullion, DO;  Location: [name] ENDOSCOPY;  Service: Gastroenterology;  Laterality: Left;   INSERTION OF DIALYSIS CATHETER Right 12/23/2021   Procedure: INSERTION OF DIALYSIS CATHETER;  Surgeon: [name], MD;  Location: [facility];  Service: Vascular;  Laterality: Right;   LAPAROSCOPY N/A 06/25/2022   Procedure: STAGING LAPAROSCOPY;  Surgeon: Dwan Bolt, MD;  Location: [place];  Service: General;  Laterality: N/A;   OPEN PARTIAL HEPATECTOMY  N/A 06/25/2022   Procedure: OPEN PARTIAL HEPATECTOMY;  Surgeon: Dwan Bolt, MD;  Location: [place];  Service: General;  Laterality: N/A;   POLYPECTOMY  01/19/2022   Procedure: POLYPECTOMY;  Surgeon: Lavena Bullion, DO;  Location: [place] ENDOSCOPY;  Service: Gastroenterology;;   TEE WITHOUT CARDIOVERSION N/A 12/22/2021   Procedure: TRANSESOPHAGEAL ECHOCARDIOGRAM (TEE);  Surgeon: Jerline Pain, MD;  [facility] ENDOSCOPY;  Service: Cardiovascular;  Laterality: N/A;   TEE WITHOUT CARDIOVERSION N/A 06/17/2022   Procedure: TRANSESOPHAGEAL ECHOCARDIOGRAM (TEE);  Surgeon: Buford Dresser, MD;  Location: [facility] ENDOSCOPY;  Service: Cardiovascular;  Laterality: N/A;   ULTRASOUND GUIDANCE FOR VASCULAR ACCESS  12/23/2021   Procedure: ULTRASOUND GUIDANCE FOR VASCULAR ACCESS;  Surgeon: Stanford Breed,  Yevonne Aline, MD;  Location[facility] OR;  Service: Vascular;;   Family History  Family History  Problem Relation Age of Onset   Hypertension Mother    Hypertension Father    Colon cancer Brother    Lung cancer Brother    Social History  reports that she has never smoked. She has never been exposed to tobacco smoke. She has never used smokeless tobacco. She reports that she does not drink alcohol and does not use  drugs. Allergies  Allergies  Allergen Reactions   Shrimp (Diagnostic) Other (See Comments)    Unknown reaction   Other Swelling and Rash    Anesthesia - facial swelling, rash   Home medications Prior to Admission medications   Medication Sig Start Date End Date Taking? Authorizing Provider  acetaminophen (TYLENOL) 325 MG tablet Take 2 tablets (650 mg total) by mouth 4 (four) times daily. Patient taking differently: Take 650 mg by mouth 4 (four) times daily as needed for moderate pain. 06/30/22  Yes Elgergawy, Silver Huguenin, MD  albuterol (VENTOLIN HFA) 108 (90 Base) MCG/ACT inhaler Inhale 2 puffs into the lungs every 4 (four) hours as needed for wheezing or shortness of breath. 10/06/22  Yes Ripley Fraise, MD  apixaban (ELIQUIS) 2.5 MG TABS tablet Take 1 tablet (2.5 mg total) by mouth 2 (two) times daily. 10/06/22 12/01/23 Yes Ripley Fraise, MD  fluticasone-salmeterol (ADVAIR DISKUS) 250-50 MCG/ACT AEPB Inhale 1 puff into the lungs in the morning and at bedtime. 10/06/22  Yes Ripley Fraise, MD  gabapentin (NEURONTIN) 100 MG capsule Take 1 capsule (100 mg total) by mouth at bedtime. 10/15/22  Yes Vu, Trung T, MD  hydrALAZINE (APRESOLINE) 100 MG tablet Take 1 tablet by mouth 3 times a day every Monday Wednesday Friday and Sunday Take 1 tablet by mouth 2 times a day every Tuesday Thursday and Saturday 10/06/22  Yes Ripley Fraise, MD  Methoxy PEG-Epoetin Beta (MIRCERA IJ) Mircera 10/09/22 10/08/23 Yes [provider]  metoprolol tartrate (LOPRESSOR) 50 MG tablet Take 1 tablet (50 mg total) by mouth 2 (two) times daily. 10/06/22  Yes Ripley Fraise, MD  pantoprazole (PROTONIX) 40 MG tablet Take 1 tablet (40 mg total) by mouth daily. 10/06/22  Yes Ripley Fraise, MD  sevelamer (RENAGEL) 800 MG tablet Take 2 tablets by mouth 3 times a day every Tuesday Thursday and Saturday Give 2 tablets by mouth with meals every Monday Wednesday Friday and Sunday 10/06/22  Yes Ripley Fraise, MD   vancomycin IVPB Inject 500 mg into the vein Every Tuesday,Thursday,and Saturday with dialysis. Indication:  MRSA discitis/osteomyelitis  First Dose: Yes Last Day of Therapy:  11/15/22 Labs - Sunday/Monday:  CBC/D, BMP, and vancomycin trough. Labs - Thursday:  BMP and vancomycin trough Labs - Every other week:  ESR and CRP Method of administration:Elastomeric Method of administration may be changed at the discretion of the patient and/or caregiver's ability to self-administer the medication ordered. 10/02/22  Yes Antonieta Pert, MD  losartan (COZAAR) 25 MG tablet Take 1 tablet (25 mg total) by mouth daily. Patient not taking: Reported on 11/30/2022 10/06/22   Ripley Fraise, MD     Vitals:   11/30/22 0730 11/30/22 0745 11/30/22 0800 11/30/22 0815  BP: (!) 151/86 (!) 163/85 (!) 169/82 (!) 165/93  Pulse:      Resp: _0 Temp:      TempSrc:      SpO2:       Exam Gen alert, no distress, elderly, deconditioned, pleasant No rash,  cyanosis or gangrene Sclera anicteric, throat clear  No jvd or bruits Chest clear bilat to bases, no rales/ wheezing RRR no MRG Abd soft ntnd no mass or ascites +bs GU defer MS no joint effusions or deformity Ext no LE or UE edema, no wounds or ulcers Neuro is alert, Ox 3 , nf    LUA AVF+bruit    Home meds include - eliquis, hydralazine 100 tid, protonix, sevelamer 2 ac tid, prns/ vits/ supps     OP HD: [place] TTS  4h  400/600   53.6kg  2/2 bath  Heparin none LUA AVF - last HD 12/23 , post wt 53.7kg - venofer 50 q wk - hectorol 2 mcg IV tiw - mircera 100 mcg q2, last 11/18 - last Hb 10.4 on 12/19    CXR - no active disease  Assessment/ Plan: AMS - seems alert today Back pain / fever - cx's pending, temp 101 in ED. Hx septic discitis multiple rounds in 2023.  IV abx started and MRI of back is ordered. Per pmd.  ESRD - on HD TTS. Has not missed OP HD. HD today / tonight or tomorrow.  HTN/ vol - BP's good, no vol excess on exam. Small UF w/  HD Anemia esrd - Hb 10s here, if Hb drops could use esa. Last given 11/18 at OP unit.  MBD ckd - CCa is a bit high at 10.4, will hold IV vdra for now. Add on phos. Cont renvela as binder.   Hep C - per pmd Atrial fib - on eliquis, holding BB for low HR.       Rob Evangelos Paulino  MD 11/30/2022, 12:53 PM Recent Labs  Lab 11/29/22 2100 11/29/22 2110 11/30/22 0536  HGB 10.0* 10.9* 9.4*  ALBUMIN 2.9*  --   --   CALCIUM 9.6  --   --   CREATININE 6.95* 7.70*  --   K 3.5 3.5  --    Inpatient medications:  apixaban  2.5 mg Oral BID   [START ON 12/01/2022] Chlorhexidine Gluconate Cloth  6 each Topical Q0600   hydrALAZINE  100 mg Oral Q8H   pantoprazole  40 mg Oral Daily   sevelamer carbonate  1,600 mg Oral TID WC    ceFEPime (MAXIPIME) IV

## 2022-11-30 NOTE — Evaluation (Signed)
Physical Therapy Evaluation Patient Details Name: MINDEE ROBLEDO MRN: [id number] DOB: Jan 02, 1951 Today's Date: 11/30/2022  History of Present Illness  Pt is a 71 y.o. female admitted 11/29/22 with AMS and fever of unknown source concerning for lumbar osteomyelitis vs abscess. MRI 12/26 with unchanged findings of discitis-osteomyelitis L2-L5; no abscess. Head CT negative for acute injury; remote lacunar infarcts in R basal ganglia and L insular cortex. Of note, recent admission 09/2022 with progression of lumbar discitis/osteomyelitis. Other PMH includes asthma, aflutter, ESRD (HD TTS), HTN, gout, MRSA bacteremia with infectious endocarditis.   Clinical Impression  Pt presents with an overall decrease in functional mobility secondary to above. PTA, pt reports limited ambulator with rollator, lives with daughter who assists with ADL/iADLs as needed, pt attends [facility] 1x/wk. Today, pt requiring min-maxA for standing and pivotal steps to recliner; pt with bladder/bowel incontinence, dependent for posterior pericare but able to assist anteriorly. Pt with decreased awareness and attention. Pt would benefit from continued acute PT services to maximize functional mobility and independence prior to d/c with SNF-level therapies.      Recommendations for follow up therapy are one component of a multi-disciplinary discharge planning process, led by the attending physician.  Recommendations may be updated based on patient status, additional functional criteria and insurance authorization.  Follow Up Recommendations Skilled nursing-short term rehab (<3 hours/day) Can patient physically be transported by private vehicle: No    Assistance Recommended at Discharge Frequent or constant Supervision/Assistance  Patient can return home with the following  A lot of help with walking and/or transfers;A lot of help with bathing/dressing/bathroom;Assistance with cooking/housework;Direct supervision/assist for medications  management;Direct supervision/assist for financial management;Assist for transportation;Help with stairs or ramp for entrance    Equipment Recommendations None recommended by PT  Recommendations for Other Services  OT consult    Functional Status Assessment Patient has had a recent decline in their functional status and demonstrates the ability to make significant improvements in function in a reasonable and predictable amount of time.     Precautions / Restrictions Precautions Precautions: Fall;Other (comment) Precaution Comments: bladder/bowel incontinence Restrictions Weight Bearing Restrictions: No      Mobility  Bed Mobility Overal bed mobility: Needs Assistance Bed Mobility: Supine to Sit     Supine to sit: Min assist     General bed mobility comments: pt frequently stopping bed mobility to scratch legs/back requiring repeated cues to actually complete task, intermittent R side lean with inconsistent ability to correct, minA for stability    Transfers Overall transfer level: Needs assistance Equipment used: Rolling walker (2 wheels) Transfers: Sit to/from Stand, Bed to chair/wheelchair/BSC Sit to Stand: Mod assist, Max assist   Step pivot transfers: Mod assist, Max assist       General transfer comment: initial stand and step pivot from bed to recliner with heavy modA and max verbal cues for sequencing, heavy posterior lean; additional sit<>stand from recliner to RW for pericare, improved ability to stand still needing cues for sequencing    Ambulation/Gait                  Stairs            Wheelchair Mobility    Modified Rankin (Stroke Patients Only)       Balance Overall balance assessment: Needs assistance Sitting-balance support: No upper extremity supported, Feet supported Sitting balance-Leahy Scale: Poor   Postural control: Right lateral lean Standing balance support: Reliant on assistive device for balance, Bilateral upper  extremity supported Standing  balance-Leahy Scale: Poor Standing balance comment: hevay reliance on BUE support and external assist, dependent for pericare; pt with R lateral/posterior lean with intermittent self-correction back to fully upright, inconsistent LOB posteriorly                             Pertinent Vitals/Pain Pain Assessment Pain Assessment: Faces Faces Pain Scale: Hurts a little bit Pain Location: abdomen, back Pain Descriptors / Indicators: Discomfort Pain Intervention(s): Monitored during session, Limited activity within patient's tolerance    Home Living Family/patient expects to be discharged to:: Private residence Living Arrangements: Children Available Help at Discharge: Family;Available PRN/intermittently   Home Access: Stairs to enter Entrance Stairs-Rails: Right;Left Entrance Stairs-Number of Steps: 8   Home Layout: One level Home Equipment: Rollator (4 wheels);Shower seat;Wheelchair - manual Additional Comments: inconsistent reports from home set-up provided last admission. pt reports living at home with daughter who works from home    Prior Function Prior Level of Function : Needs assist;Patient poor historian/Family not available             Mobility Comments: pt reports limited ambulator with rollator, also uses w/c. daughter assists on stairs. pt attends PACE 1x/wk ADLs Comments: pt reports uses shower seat to bathe with assist from daughter; daughter assists with LB dressing. family does cooking, cleaning and transportation     Hand Dominance        Extremity/Trunk Assessment   Upper Extremity Assessment Upper Extremity Assessment: Generalized weakness;Difficult to assess due to impaired cognition    Lower Extremity Assessment Lower Extremity Assessment: Generalized weakness;Difficult to assess due to impaired cognition    Cervical / Trunk Assessment Cervical / Trunk Assessment: Other exceptions Cervical / Trunk  Exceptions: significant R-side lean with inconsistent ability to correct  Communication      Cognition Arousal/Alertness: Awake/alert Behavior During Therapy: Flat affect Overall Cognitive Status: No family/caregiver present to determine baseline cognitive functioning Area of Impairment: Attention, Memory, Following commands, Safety/judgement, Awareness, Problem solving                   Current Attention Level: Selective   Following Commands: Follows one step commands inconsistently, Follows one step commands with increased time Safety/Judgement: Decreased awareness of deficits, Decreased awareness of safety Awareness: Intellectual Problem Solving: Slow processing, Requires verbal cues, Requires tactile cues, Decreased initiation, Difficulty sequencing General Comments: pt seemingly answering home set-up and PLOF corrects, though do not match details provided on prior admissions. suspect slowed processing and decreased awareness; pt requiring frequent redirection, poor insight        General Comments      Exercises     Assessment/Plan    PT Assessment Patient needs continued PT services  PT Problem List Decreased strength;Decreased activity tolerance;Decreased balance;Decreased mobility;Decreased cognition;Decreased knowledge of use of DME;Decreased safety awareness;Pain       PT Treatment Interventions DME instruction;Gait training;Functional mobility training;Therapeutic activities;Therapeutic exercise;Balance training;Patient/family education;Wheelchair mobility training    PT Goals (Current goals can be found in the Care Plan section)  Acute Rehab PT Goals Patient Stated Goal: return home, willing to consider ST rehab PT Goal Formulation: With patient Time For Goal Achievement: 12/14/22 Potential to Achieve Goals: Fair    Frequency Min 2X/week     Co-evaluation               AM-PAC PT "6 Clicks" Mobility  Outcome Measure Help needed turning from your  back to your side while in a  flat bed without using bedrails?: A Lot Help needed moving from lying on your back to sitting on the side of a flat bed without using bedrails?: A Lot Help needed moving to and from a bed to a chair (including a wheelchair)?: A Lot Help needed standing up from a chair using your arms (e.g., wheelchair or bedside chair)?: A Lot Help needed to walk in hospital room?: Total Help needed climbing 3-5 steps with a railing? : Total 6 Click Score: 10    End of Session Equipment Utilized During Treatment: Gait belt Activity Tolerance: Patient tolerated treatment well;Patient limited by fatigue Patient left: in chair;with call bell/phone within reach Nurse Communication: Mobility status PT Visit Diagnosis: Other abnormalities of gait and mobility (R26.89);Muscle weakness (generalized) (M62.81)    Time: 9528-4132 PT Time Calculation (min) (ACUTE ONLY): 24 min   Charges:   PT Evaluation $PT Eval Moderate Complexity: 1 Mod PT Treatments $Therapeutic Activity: 8-22 mins       Mabeline Caras, PT, DPT Acute Rehabilitation Services  Personal: [facility] Office: [place] 11/30/2022, 3:40 PM

## 2022-11-30 NOTE — H&P (Addendum)
Date: 11/30/2022               Patient Name:  ETOLA MULL MRN: [id number]  DOB: [birth year] Age / Sex: 71 y.o., female   [facility] [place]         Medical Service: Internal Medicine Teaching Service         Attending Physician: Dr. Aldine Contes, MD    First Contact: Dr. Stann Mainland Pager: [phone]  Second Contact: Dr. Marlou Sa Pager: [phone]       After Hours (After 5p/  First Contact Pager: [phone]  weekends / holidays): Second Contact Pager: [phone]   Chief Complaint: Altered Mental Status  History of Present Illness:  Patient is a 71 year old female with medical history of ESRD on hemodialysis TTS, lumbar discitis and endocarditis secondary to MRSA bacteremia, hepatitis C infection, atrial fibrillation, and anemia of chronic disease brought to the ED by her daughter for altered mental status.  History was obtained from chart review and the patient.  Patient's mental status improved during her stay in the emergency department but her daughter reports this has issue for the past day.  Per ED report patient is usually alert and oriented x4 but family noted that she was confused yesterday prompting them to bring her to the ED.  On my interview with the patient she is alert and oriented.  She states he was brought here because her family thinks her mental status is not normal but she feels fine.  She is denying any headache, chest pain, shortness of breath, neck pain.  She states she has been having worsening back pain for the last 2 weeks that is located in the lumbar region.  This pain has been a problem for her since she had her gallbladder removed.  It improved after she was discharged from the hospital but has recently gotten worse.  Other positive findings on review of systems include fatigue and comfort in the lower abdomen.  In the ED, lab work notable for CBC without leukocytosis, stable anemia with macrocytosis. Mild thrombocytopenia. CMP with normal K,  elevated Cr and BUN, low albumin, and normal liver enzymes. Initial lactic acid was elevated at 2.7 but resolved. Resp panel negative for RSV, Flu, Covid. CTH without acute changes.   Past Medical History:   Diagnosis Date   Anemia    Asthma    Atrial flutter with rapid ventricular response ([place]) 12/25/2021   ESRD on HD    dialysis Tues Thurs Sat   Gout    Hypertension     Meds:     Current Facility-Administered Medications (Cardiovascular):    hydrALAZINE (APRESOLINE) tablet 100 mg  Current Outpatient Medications (Cardiovascular):    hydrALAZINE (APRESOLINE) 100 MG tablet, Take 1 tablet by mouth 3 times a day every Monday Wednesday Friday and Sunday Take 1 tablet by mouth 2 times a day every Tuesday Thursday and Saturday   losartan (COZAAR) 25 MG tablet, Take 1 tablet (25 mg total) by mouth daily.   metoprolol tartrate (LOPRESSOR) 50 MG tablet, Take 1 tablet (50 mg total) by mouth 2 (two) times daily.   Current Outpatient Medications (Respiratory):    albuterol (VENTOLIN HFA) 108 (90 Base) MCG/ACT inhaler, Inhale 2 puffs into the lungs every 4 (four) hours as needed for wheezing or shortness of breath.   fluticasone-salmeterol (ADVAIR DISKUS) 250-50 MCG/ACT AEPB, Inhale 1 puff into the lungs in the morning and at bedtime.   Current Outpatient Medications (Analgesics):  acetaminophen (TYLENOL) 325 MG tablet, Take 2 tablets (650 mg total) by mouth 4 (four) times daily. (Patient taking differently: Take 650 mg by mouth 4 (four) times daily as needed for moderate pain.)  Current Facility-Administered Medications (Hematological):    heparin injection 5,000 Units  Current Outpatient Medications (Hematological):    apixaban (ELIQUIS) 2.5 MG TABS tablet, Take 1 tablet (2.5 mg total) by mouth 2 (two) times daily.   Methoxy PEG-Epoetin Beta (MIRCERA IJ), Mircera  Current Facility-Administered Medications (Other):    pantoprazole (PROTONIX) EC tablet 40 mg   sevelamer carbonate  (RENVELA) tablet 1,600 mg  Current Outpatient Medications (Other):    B Complex-C-Zn-Folic Acid (DIALYVITE 381 WITH ZINC) 0.8 MG TABS, Take by mouth.   gabapentin (NEURONTIN) 100 MG capsule, Take 1 capsule (100 mg total) by mouth at bedtime.   pantoprazole (PROTONIX) 40 MG tablet, Take 1 tablet (40 mg total) by mouth daily.   sevelamer (RENAGEL) 800 MG tablet, Take 2 tablets by mouth 3 times a day every Tuesday Thursday and Saturday Give 2 tablets by mouth with meals every Monday Wednesday Friday and Sunday   sevelamer carbonate (RENVELA) 800 MG tablet, Take by mouth.   vancomycin IVPB, Inject 500 mg into the vein Every Tuesday,Thursday,and Saturday with dialysis. Indication:  MRSA discitis/osteomyelitis  First Dose: Yes Last Day of Therapy:  11/15/22 Labs - Sunday/Monday:  CBC/D, BMP, and vancomycin trough. Labs - Thursday:  BMP and vancomycin trough Labs - Every other week:  ESR and CRP Method of administration:Elastomeric Method of administration may be changed at the discretion of the patient and/or caregiver's ability to self-administer the medication ordered.    Allergies: Allergies as of 11/29/2022 - Review Complete 11/29/2022  Allergen Reaction Noted   Shellfish allergy Other (See Comments) 03/04/2022   Other Rash 07/07/2022    Past Surgical History: Past Surgical History:  Procedure Laterality Date   AV FISTULA PLACEMENT Left 12/23/2021   Procedure: LEFT ARM BRACHIOBASILIC VEIN ARTERIOVENOUS (AV) FISTULA CREATION;  Surgeon: [name], MD;  Location: [place];  Service: Vascular;  Laterality: Left;   [place] Left 03/08/2022   Procedure: LEFT SECOND STAGE BASILIC VEIN TRANSPOSITION;  Surgeon: [name], MD;  Location[facility] OR;  Service: Vascular;  Laterality: Left;   BIOPSY  01/19/2022   Procedure: BIOPSY;  Surgeon: Lavena Bullion, DO;  Location: [place] ENDOSCOPY;  Service: Gastroenterology;;   [place] N/A 06/25/2022    Procedure: OPEN CHOLECYSTECTOMY;  Surgeon: Dwan Bolt, MD;  Location: [place];  Service: General;  Laterality: N/A;   COLONOSCOPY Left 01/19/2022   Procedure: COLONOSCOPY;  Surgeon: Lavena Bullion, DO;  Location: [place];  Service: Gastroenterology;  Laterality: Left;   ESOPHAGOGASTRODUODENOSCOPY Left 01/19/2022   Procedure: ESOPHAGOGASTRODUODENOSCOPY (EGD);  Surgeon: Lavena Bullion, DO;  Location: [facility] ENDOSCOPY;  Service: Gastroenterology;  Laterality: Left;   INSERTION OF DIALYSIS CATHETER Right 12/23/2021   Procedure: INSERTION OF DIALYSIS CATHETER;  Surgeon: [name], MD;  Location: [place];  Service: Vascular;  Laterality: Right;   LAPAROSCOPY N/A 06/25/2022   Procedure: STAGING LAPAROSCOPY;  Surgeon: Dwan Bolt, MD;  Location: [place];  Service: General;  Laterality: N/A;   OPEN PARTIAL HEPATECTOMY  N/A 06/25/2022   Procedure: OPEN PARTIAL HEPATECTOMY;  Surgeon: Dwan Bolt, MD;  Location: [place];  Service: General;  Laterality: N/A;   POLYPECTOMY  01/19/2022   Procedure: POLYPECTOMY;  Surgeon: Lavena Bullion, DO;  Location: [place];  Service: Gastroenterology;;   TEE WITHOUT CARDIOVERSION N/A 12/22/2021   Procedure: TRANSESOPHAGEAL ECHOCARDIOGRAM (TEE);  Surgeon: Jerline Pain, MD;  Location: [facility] ENDOSCOPY;  Service: Cardiovascular;  Laterality: N/A;   TEE WITHOUT CARDIOVERSION N/A 06/17/2022   Procedure: TRANSESOPHAGEAL ECHOCARDIOGRAM (TEE);  Surgeon: Buford Dresser, MD;  Location: [facility] ENDOSCOPY;  Service: Cardiovascular;  Laterality: N/A;   ULTRASOUND GUIDANCE FOR VASCULAR ACCESS  12/23/2021   Procedure: ULTRASOUND GUIDANCE FOR VASCULAR ACCESS;  Surgeon: [name], MD;  Location: [facility] OR;  Service: Vascular;;    Family History:  Family History  Problem Relation Age of Onset   Hypertension Mother    Hypertension Father    Colon cancer Brother    Lung cancer Brother     Social History:  Patient lives with daughter and states he assists with  ADLs and IADLs.  Patient is unaware of her medications and states her daughter administers them.  Patient uses a walker and has a wheelchair for ambulation.  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  She is a lifetime non-smoker and non-drinker.  Physical Exam: Blood pressure (!) 174/93, pulse 69, temperature 99.6 F (37.6 C), temperature source Oral, resp. rate 16, SpO2 100 %. General: NAD, pleasant female HENT: NCAT, good range of motion of the neck, no pain on palpation.  Lungs: CTAB Cardiovascular: NSR, continuous murmur present, good pulses in bilateral feet.  Abdomen: good bowel sounds, no TTP MSK: No asymmetry, 4/5 strength in bilateral upper and lower extremities. Fistula placed on LUE. TTP in the lumbar region.  Skin: No skin lesions Neuro: alert and oriented x4. Decreased sensation in bilateral lower extremities.  Psych: normal mood and normal affect  Diagnostics:     Latest Ref Rng & Units 11/29/2022    9:10 PM 11/29/2022    9:00 PM 10/05/2022   12:42 AM  CBC  WBC 4.0 - 10.5 K/uL  6.8  3.8   Hemoglobin 12.0 - 15.0 g/dL 10.9  10.0  11.2   Hematocrit 36.0 - 46.0 % 32.0  32.9  33.8   Platelets 150 - 400 K/uL  125  106        Latest Ref Rng & Units 11/29/2022    9:10 PM 11/29/2022    9:00 PM 10/05/2022   12:42 AM  CMP  Glucose 70 - 99 mg/dL 95  100  112   BUN 8 - 23 mg/dL 36  38  34   Creatinine 0.44 - 1.00 mg/dL 7.70  6.95  7.36   Sodium 135 - 145 mmol/L 137  137  143   Potassium 3.5 - 5.1 mmol/L 3.5  3.5  4.1   Chloride 98 - 111 mmol/L 99  96  97   CO2 22 - 32 mmol/L  25  28   Calcium 8.9 - 10.3 mg/dL  9.6  11.0   Total Protein 6.5 - 8.1 g/dL  6.9    Total Bilirubin 0.3 - 1.2 mg/dL  0.3    Alkaline Phos 38 - 126 U/L  48    AST 15 - 41 U/L  15    ALT 0 - 44 U/L  8      CT Head Wo Contrast  Result Date: 11/30/2022 CLINICAL DATA:  Altered mental status EXAM: CT HEAD WITHOUT CONTRAST  IMPRESSION: 1. No acute intracranial hemorrhage or  infarct. 2. Stable extensive periventricular white matter changes likely reflecting the sequela of small vessel ischemia. 3. Remote lacunar infarcts within the right basal ganglia and  left insular cortex. Electronically Signed   By: Fidela Salisbury M.D.   On: 11/30/2022 00:24     EKG: personally reviewed my interpretation is sinus rhythm, with RBBB.   CXR: personally reviewed my interpretation is no active disease process.   Assessment & Plan by Problem: Patient is a 71 year old female past medical history of ESRD on HD, recent lumbar discitis and endocarditis secondary to MRSA bacteremia that status post IV vancomycin presenting with altered mental status that has resolved but fever without exact etiology.  Present on Admission:  Altered mental status  #Alerted Mental Status #Fever of unknown etiology #Worsening lumbar pain  Patient complained that brought her to the ED with altered mental status noted by the family which has resolved.  No fevers reported by the patient or the family but patient was febrile on initial evaluation at 101.5 which abated with Tylenol.  Low concern for pneumonia given no symptoms and clear chest x-ray.  Patient is having some discomfort in the lower abdomen but is denying any dysuria, urgency or increased frequency.  Low concern for urinary tract infection but a UA was ordered so we will follow-up on the UA. Patient has history of septic discitis secondary to MRSA bacteremia.  Patient completed multiple rounds of antibiotics including since 12/2021, 06/2022 and 07/2022 and 09/2022. Initial source of infection thought to be secondary to HD catheter. Patient is complaining of increased lumbar pain for about 2 weeks which is coinciding with completion of her antibiotic course on 11/15/2022 raising suspicion for this being a continued source of infection.  Will need to get imaging to rule out abscess formation.  Ordered CRP and ESR.  Patient has received cefepime and  vancomycin in the ED.  I believe ID consultation will be important given the recurrent nature of this infection if the results come back positive or the source of fever remains unknown.  Will hold all antipyretics to see if patient spikes fever.. -Obtain lumbar MRI -Follow-up CRP, ESR -Follow-up blood cultures -Continue cefepime and vancomycin, will need to re-dose moving forward with ID input.  -ID consult in the a.m.  #ESRD on HD #HTN #Paroxymal Atrial Fibrillation Chronic. Appears euvolemic.  Patient is oliguric.  Will consult nephrology in the AM to get pt on dialysis schedule. No urgent need for dialysis. BP elevated so will resume home hydralazine 100 mg TID and Will hold metoprolol given patient's heart rate in the 60s. --Neurology consult for HD --Monitor BP and heart rate  #Hep C Hep C positive with viral count nausea and 941k IU/mL.  Patient is aware of the diagnosis.  She is interested in getting treatment for this.  Will order genotyping but patient will need outpatient ID follow-up. -Outpatient ID follow-up.  #GERD Chronic. Continue home med.   #Asthma Chronic. Continue home inhaler.   #CVA noted on CT Remote infarct. Needs to have RF optimization. Last lipid panel charted is 2012. Will repeat lipid panel. A1c less than 4.2 but in setting of ESRD. Had echo without any shunts.  -follow up lipid panel.   DVT prophx: Heparin Diet: HH Diet Bowel: PRN Code: Full  Prior to Admission Living Arrangement: Home Anticipated Discharge Location: Home Barriers to Discharge: Medical Workup  Dispo: Admit patient to Inpatient with expected length of stay greater than 2 midnights.  Idamae Schuller, MD [facility]. [facility] Internal Medicine Residency, PGY-2

## 2022-11-30 NOTE — ED Notes (Signed)
Hold LR until lactic results. Notified IV team to get another line.

## 2022-11-30 NOTE — ED Provider Notes (Signed)
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  I spoke with patient's daughter who notes that patient was slightly altered today, daughter states that she lives at home, will  walker, she typically has weakness in her right leg as well as her right arm from stroke from last time she was here.  Per previous provider patient presented with fever, she had no identifiable source, she has slight lack stenosis, there is concerns for possible bacteremia/discitis from her previous history of such she was started on broad spec antibiotics, plan is to follow-up on CT head and chest and admit to medicine.   Physical Exam  BP (!) 156/90   Pulse 66   Temp 99.6 F (37.6 C) (Oral)   Resp 14   SpO2 98%   Physical Exam Vitals and nursing note reviewed.  Constitutional:      General: She is not in acute distress.    Appearance: She is not ill-appearing.  HENT:     Head: Normocephalic and atraumatic.     Comments: There is no deformity of the head present no raccoon eyes or Battle sign noted.    Nose: No congestion.     Mouth/Throat:     Mouth: Mucous membranes are moist.     Pharynx: Oropharynx is clear.     Comments: No trismus no torticollis no oral trauma present Eyes:     Extraocular Movements: Extraocular movements intact.     Conjunctiva/sclera: Conjunctivae normal.     Pupils: Pupils are equal, round, and reactive to light.  Cardiovascular:     Rate and Rhythm: Normal rate and regular  rhythm.     Pulses: Normal pulses.     Heart sounds: No murmur heard.    No friction rub. No gallop.  Pulmonary:     Effort: No respiratory distress.     Breath sounds: No wheezing, rhonchi or rales.  Abdominal:     Palpations: Abdomen is soft.     Tenderness: There is no abdominal tenderness. There is no right CVA tenderness or left CVA tenderness.  Musculoskeletal:     Comments: Spine was palpated was nontender to palpation no step-off deformities noted, no overlying skin changes.  Skin:    General: Skin is warm and dry.  Neurological:     Mental Status: She is alert.     Comments: Patient is alert and oriented x 4 but does respond slowly, cranial nerves II through XII grossly intact, there is noted weakness on her right side, about 4-5 strength in the upper and lower extremity, sensation intact to light touch.  Psychiatric:        Mood and Affect: Mood normal.     Procedures  Procedures  ED Course / MDM    Medical Decision Making Amount and/or Complexity of Data Reviewed Labs: ordered. Radiology: ordered.  Risk OTC drugs. Prescription drug management. Decision regarding hospitalization.    Lab Tests:  I Ordered, and personally interpreted labs.  The pertinent results include: CBC shows stable macrocytic anemia hemoglobin 10, CMP shows  glucose of 100, BUN 38, creatinine 7, respiratory panel negative initial lactic 2.4-second lactic 2.7,   Imaging Studies ordered:  I ordered imaging studies including CT head, DG chest I independently visualized and interpreted imaging which showed CT head negative acute findings, I agree with the radiologist interpretation   Cardiac Monitoring:  The patient was maintained on a cardiac monitor.  I personally viewed and interpreted the cardiac monitored which showed an underlying rhythm of: EKG without signs of ischemia   Medicines ordered and prescription drug management:  I ordered medication including broad-spectrum  antibiotics I have reviewed the patients home medicines and have made adjustments as needed  Critical Interventions:  N/a   Reevaluation:  Third lactic is elevated greater than 10, this appears to be abnormal, I question the validity of it, will repeat lactic and reassess the patient.  Repeat lactic was unremarkable will admit to medicine  Consultations Obtained:  I requested consultation with the internal medicine doctor knan,  and discussed lab and imaging findings as well as pertinent plan - they recommend: We will admit the patient.    Test Considered:  N/A    Rule out Suspicion for intracranial head bleed is low CT imaging is negative these findings.  I doubt acute CVA as there is no new focal deficit present my exam, she does has  weakness on her right side per family this is normal for her but I am concerned that there could be a possible lesion in her C-spine causing worsening weakness especially with her history of discitis as well as bacteremia she would likely benefit MRI for further evaluation.  My suspicion for meningitis is low she has no neck tenderness, there is no overlying skin changes spine is nontender to palpation.    Dispostion and problem list  After consideration of the diagnostic results and the patients response to treatment, I feel that the patent would benefit from admission.  Altered mental status-unclear etiology, concern for bacteremia versus discitis, patient is currently on broad-spectrum antibiotics, patient would likely benefit from MRI for further evaluation to exclude possible discitis.  She will need further monitoring.           Marcello Fennel, PA-C 11/30/22 0330    Orpah Greek, MD 11/30/22 [phone]

## 2022-11-30 NOTE — Progress Notes (Signed)
Hospital day#0 Subjective:   Summary: Diamond [name] is a 71 y.o. with a pertinent PMH of HTN, ESRD on dialysis TTS, atrial flutter, hx lumbar osteomylitis and MRSA bacteremia with infectious endocarditis admitted for AMS and fever of unknown source concerning for lumbar osteomyelitis vs abscess  Overnight Events: None  Patient was evaluated in the ED as follow up. She expresses she is not confused and does not want to be in the hospital. She would be okay finishing HD today. Did report increase pain in lumbar back for the past couple of weeks, but that it is getting better. Denies nausea, vomiting, she continues to produce urine without dysuria or suprapubic pain. Patient also denies fevers, chills, sore throat , rhinorrhea, headache, rashes, ulcers. Formed BM, last one today. Last HD was Saturday. Wants to get HD today and then go home   Objective:  Vital signs in last 24 hours: Vitals:   11/30/22 0730 11/30/22 0745 11/30/22 0800 11/30/22 0815  BP: (!) 151/86 (!) 163/85 (!) 169/82 (!) 165/93  Pulse:      Resp: _0 Temp:      TempSrc:      SpO2:       Supplemental O2: Room Air SpO2: 99 % There were no vitals filed for this visit.  Physical Exam:  Constitutional:chronically ill-appearing woman sitting in bed, in no acute distress HENT: normocephalic atraumatic, moist mucous membranes Neck: supple Cardiovascular: regular rate and rhythm, no m/r/g. LUE AVF with palpable thrill and +bruit Pulmonary/Chest: normal work of breathing on room air, lungs clear to auscultation bilaterally. No wheezing Abdominal: normal BS, soft, non-tender, non-distended. MSK: normal bulk and tone.moving all extremities Neurological: alert & oriented x 3, moving all extremities Skin: warm and dry Psych: Normal mood and affect   Intake/Output Summary (Last 24 hours) at 11/30/2022 1026 Last data filed at 11/30/2022 0024 Gross per 24 hour  Intake 800 ml  Output --  Net 800 ml   Net IO  Since Admission: 800 mL [11/30/22 1026]  Pertinent Labs:    Latest Ref Rng & Units 11/30/2022    5:36 AM 11/29/2022    9:10 PM 11/29/2022    9:00 PM  CBC  WBC 4.0 - 10.5 K/uL 5.5   6.8   Hemoglobin 12.0 - 15.0 g/dL 9.4  10.9  10.0   Hematocrit 36.0 - 46.0 % 28.4  32.0  32.9   Platelets 150 - 400 K/uL 72   125        Latest Ref Rng & Units 11/29/2022    9:10 PM 11/29/2022    9:00 PM 10/05/2022   12:42 AM  CMP  Glucose 70 - 99 mg/dL 95  100  112   BUN 8 - 23 mg/dL 36  38  34   Creatinine 0.44 - 1.00 mg/dL 7.70  6.95  7.36   Sodium 135 - 145 mmol/L 137  137  143   Potassium 3.5 - 5.1 mmol/L 3.5  3.5  4.1   Chloride 98 - 111 mmol/L 99  96  97   CO2 22 - 32 mmol/L  25  28   Calcium 8.9 - 10.3 mg/dL  9.6  11.0   Total Protein 6.5 - 8.1 g/dL  6.9    Total Bilirubin 0.3 - 1.2 mg/dL  0.3    Alkaline Phos 38 - 126 U/L  48    AST 15 - 41 U/L  15    ALT 0 - 44 U/L  8      Imaging: MR LUMBAR SPINE WO CONTRAST  Result Date: 11/30/2022 CLINICAL DATA:  Worsening low back pain over the past 2 weeks. History of osteomyelitis discitis. EXAM: MRI LUMBAR SPINE WITHOUT CONTRAST TECHNIQUE: Multiplanar, multisequence MR imaging of the lumbar spine was performed. No intravenous contrast was administered. COMPARISON:  MRI lumbar spine dated September 17, 2022. FINDINGS: Segmentation: Unchanged transitional lumbosacral anatomy with sacralization of L5. Alignment:  Unchanged trace anterolisthesis at L1-L2 and L3-L4. Vertebrae: Similar degree of endplate irregularity, marrow edema, and decreased T1 signal at L2-L3 and L4-L5. Diffusely decreased L3-T1 marrow signal is also unchanged. Slightly decreased amount of intradiscal fluid at L2-L3 and L4-L5. Chronic L3 compression deformities unchanged. No acute fracture or suspicious bone lesion. Conus medullaris and cauda equina: Conus extends to the L1 level. Similar degree of anterior epidural phlegmon at L3. Paraspinal and other soft tissues: Worsened  paravertebral inflammatory changes from L2-L4, most notable anteriorly where there is increased soft tissue thickening between the vertebral bodies and aorta/IVC. No drainable fluid collection. Multiple bilateral renal simple cysts again noted. No follow-up imaging is recommended. Disc levels: T12-L1:  Negative. L1-L2: Unchanged mild disc bulging with superimposed small central disc protrusion. Unchanged mild bilateral facet arthropathy. Unchanged mild bilateral lateral recess stenosis. No spinal canal or neuroforaminal stenosis. L2-L3: Unchanged disc bulging and bilateral ligamentum flavum hypertrophy resulting in mild-to-moderate spinal canal and bilateral lateral recess stenosis. Unchanged mild bilateral neuroforaminal stenosis. L3-L4: Unchanged moderate disc bulging, bilateral facet arthropathy, and anterior epidural phlegmon resulting in moderate to severe spinal canal stenosis with mild right and moderate left neuroforaminal stenosis. L4-L5: Unchanged circumferential disc osteophyte complex with superimposed central disc protrusion. Unchanged moderate bilateral facet arthropathy. Unchanged moderate spinal canal and moderate to severe bilateral lateral recess stenosis. Unchanged mild-to-moderate bilateral neuroforaminal stenosis. L5-S1:  Transitional level.  Negative. IMPRESSION: 1. Unchanged findings of discitis-osteomyelitis at L2-L3 and L4-L5 with worsening paravertebral inflammatory changes. No paravertebral or epidural abscess. 2. Unchanged mild-to-moderate L2-L3, moderate to severe L3-L4, and moderate L4-L5 spinal canal stenosis. Electronically Signed   By: Titus Dubin M.D.   On: 11/30/2022 09:42   DG Chest 1 View  Result Date: 11/30/2022 CLINICAL DATA:  Fever.  Evaluate for infection EXAM: CHEST  1 VIEW COMPARISON:  10/04/2022 FINDINGS: Similar cardiomegaly given differences in technique and patient rotation. No focal consolidation, pleural effusion, or pneumothorax. No acute osseous  abnormality. Irregular density projecting over the left mid lung corresponds to dense breast calcification seen on prior imaging. No displaced rib fractures. IMPRESSION: No active disease.  Cardiomegaly. Electronically Signed   By: Placido Sou M.D.   On: 11/30/2022 03:00   CT Head Wo Contrast  Result Date: 11/30/2022 CLINICAL DATA:  Altered mental status EXAM: CT HEAD WITHOUT CONTRAST TECHNIQUE: Contiguous axial images were obtained from the base of the skull through the vertex without intravenous contrast. RADIATION DOSE REDUCTION: This exam was performed according to the departmental dose-optimization program which includes automated exposure control, adjustment of the mA and/or kV according to patient size and/or use of iterative reconstruction technique. COMPARISON:  10/05/2022 FINDINGS: Brain: Normal anatomic configuration. Parenchymal volume loss is commensurate with the patient's age. Stable extensive periventricular white matter changes are present likely reflecting the sequela of small vessel ischemia. Remote lacunar infarcts within the inferior right basal ganglia and left insular cortex. No abnormal intra or extra-axial mass lesion or fluid collection. No abnormal mass effect or midline shift. No evidence of acute intracranial hemorrhage or infarct. Ventricular size is normal.  Cerebellum unremarkable. Vascular: No asymmetric hyperdense vasculature at the skull base. Skull: Intact Sinuses/Orbits: Paranasal sinuses are clear. Orbits are unremarkable. Other: Mastoid air cells and middle ear cavities are clear. IMPRESSION: 1. No acute intracranial hemorrhage or infarct. 2. Stable extensive periventricular white matter changes likely reflecting the sequela of small vessel ischemia. 3. Remote lacunar infarcts within the right basal ganglia and left insular cortex. Electronically Signed   By: Fidela Salisbury M.D.   On: 11/30/2022 00:24    Assessment/Plan:   Principal Problem:   Altered mental  status Active Problems:   AMS (altered mental status)   Patient Summary: Diamond [name] is a 71 y.o. with a pertinent PMH of HTN, ESRD on dialysis TTS, atrial flutter, hx lumbar osteomylitis and MRSA bacteremia with infectious endocarditis who presented with AMS, now resolved and fever of unknown origin, admitted for further infectious work up.    AMS, resolved Fever on unknown source Hx of recurrent bacteremia 2/2 lumbar spine osteomyelitis Hx of infective endocarditis with R atrial vegetation Presented with AMS, now resolved, and 100.5 F fever.  Patient followed by ID outpatient for  Last vanc at [facility] 11/15/2022 for lumbar osteomyelitis. Patient denies URI symptoms, abdominal discomfort, suprapubic tenderness. Patient continues to endorse worsening lumbar pain over past 2 weeks, though not in pain currently. RUE fistula without signs infection. No other skin lesions noted today. No new murmur on exam. Initially with high suspicion for worsening osteomyelitis vs lumbar abscess. Lumbar MRI with unchanged osteomyelitis at L2-L3, L4-5 with worsening paravertebral inflammatory changes.  No leukocytosis, lactic acid elevation to >9.0, now resolved. On cefepime and vancomycin per pharmacy Pt is currently  hesitant about staying in the hospital besides obtaining HD today.  -Continue on Vancomycin per pharmacy and cefepime (12/25- -F/u blood cultures x2 -F/u ESR and CRP -Pain management: Tylenol   ESRD iHD on TTS Last HD on Sat 12/23. -Nephrology consulted for HD today -Continue Sevelamer 1600 mg TID  Atrial flutter Eliquis 2.5 mg BID  R atrial mass TEE 12/22/2021 with right atrial/IVC globular mobile mass consistent with thombous/vegetation. Resolved on repeat echocardiogram. Finished IV antibiotics for this on 02/13/2022. No cardiac symptoms today.   HTN On hydralazine 100 mg  GERD  Protonix 40 mg  Diet: Renal with fluids VTE: On eliquis Code: Full PT/OT recs: pending Family  Update: attempted. Will try again   Dispo: Anticipated discharge to  home  pending clinical improvement and antibiotic plan. Possibly discharge today.  Romana Juniper, MD Internal Medicine Resident PGY-1 Please contact the on call pager after 5 pm and on weekends at [phone].

## 2022-11-30 NOTE — ED Notes (Signed)
Phlebotomy asked to try to get lactic.

## 2022-11-30 NOTE — Evaluation (Signed)
Occupational Therapy Evaluation Patient Details Name: Diamond [name] MRN: [id number] DOB: 1951/01/28 Today's Date: 11/30/2022   History of Present Illness Pt is a 71 y.o. female admitted 11/29/22 with AMS and fever of unknown source concerning for lumbar osteomyelitis vs abscess. MRI 12/26 with unchanged findings of discitis-osteomyelitis L2-L5; no abscess. Head CT negative for acute injury; remote lacunar infarcts in R basal ganglia and L insular cortex. Of note, recent admission 09/2022 with progression of lumbar discitis/osteomyelitis. Other PMH includes asthma, aflutter, ESRD (HD TTS), HTN, gout, MRSA bacteremia with infectious endocarditis.   Clinical Impression   Diamond [name] was evaluated s/p the above admission list, per her report she lives with her daughter who assists with ADLs at baseline and uses a rollator vs wc for mobility. Upon evaluation pt was limited by weakness, impaired balance, poor activity tolerance, incontinence, and impaired cognition. Overall she required mod A to stand from the chair x3 and only tolerated standing for <30 seconds. Due to deficits listed below, she also requires set up - total A for ADLs. OT to continue to follow acutely. Recommend d/c to SNF for continued therapy and safety.      Recommendations for follow up therapy are one component of a multi-disciplinary discharge planning process, led by the attending physician.  Recommendations may be updated based on patient status, additional functional criteria and insurance authorization.   Follow Up Recommendations  Skilled nursing-short term rehab (<3 hours/day)     Assistance Recommended at Discharge Frequent or constant Supervision/Assistance  Patient can return home with the following A lot of help with walking and/or transfers;A lot of help with bathing/dressing/bathroom;Assistance with feeding;Assistance with cooking/housework;Direct supervision/assist for medications management;Direct supervision/assist  for financial management;Assist for transportation;Help with stairs or ramp for entrance    Functional Status Assessment  Patient has had a recent decline in their functional status and demonstrates the ability to make significant improvements in function in a reasonable and predictable amount of time.  Equipment Recommendations  Other (comment) (defer)       Precautions / Restrictions Precautions Precautions: Fall;Other (comment) Precaution Comments: bladder/bowel incontinence Restrictions Weight Bearing Restrictions: No      Mobility Bed Mobility               General bed mobility comments: pt OOB upon arrival    Transfers Overall transfer level: Needs assistance Equipment used: Rolling walker (2 wheels) Transfers: Sit to/from Stand Sit to Stand: Mod assist           General transfer comment: x3 sit<>stands      Balance Overall balance assessment: Needs assistance Sitting-balance support: No upper extremity supported, Feet supported Sitting balance-Leahy Scale: Poor     Standing balance support: Bilateral upper extremity supported, During functional activity, Reliant on assistive device for balance Standing balance-Leahy Scale: Poor                             ADL either performed or assessed with clinical judgement   ADL Overall ADL's : Needs assistance/impaired Eating/Feeding: Set up;Sitting   Grooming: Set up;Sitting   Upper Body Bathing: Minimal assistance;Sitting   Lower Body Bathing: Maximal assistance;Sit to/from stand   Upper Body Dressing : Minimal assistance;Sitting   Lower Body Dressing: Maximal assistance;Sit to/from stand   Toilet Transfer: Moderate assistance;Stand-pivot;Rolling walker (2 wheels);BSC/3in1   Toileting- Clothing Manipulation and Hygiene: Total assistance;Sit to/from stand       Functional mobility during ADLs: Moderate assistance;Rolling walker (2 wheels)  General ADL Comments: simple cues required,  very limited tolerance. impaired cognition with poor insight     Vision Baseline Vision/History: 0 No visual deficits Vision Assessment?: No apparent visual deficits     Perception Perception Perception Tested?: No   Praxis Praxis Praxis tested?: Not tested    Pertinent Vitals/Pain Pain Assessment Pain Assessment: Faces Faces Pain Scale: Hurts a little bit Pain Location: abdomen, back Pain Descriptors / Indicators: Discomfort Pain Intervention(s): Limited activity within patient's tolerance, Monitored during session     Hand Dominance Right   Extremity/Trunk Assessment Upper Extremity Assessment Upper Extremity Assessment: Generalized weakness   Lower Extremity Assessment Lower Extremity Assessment: Generalized weakness   Cervical / Trunk Assessment Cervical / Trunk Assessment: Other exceptions Cervical / Trunk Exceptions: significant R-side lean with inconsistent ability to correct   Communication Communication Communication: No difficulties   Cognition Arousal/Alertness: Awake/alert Behavior During Therapy: Flat affect Overall Cognitive Status: No family/caregiver present to determine baseline cognitive functioning Area of Impairment: Attention, Memory, Following commands, Safety/judgement, Awareness, Problem solving                   Current Attention Level: Selective   Following Commands: Follows one step commands inconsistently, Follows one step commands with increased time Safety/Judgement: Decreased awareness of deficits, Decreased awareness of safety Awareness: Intellectual Problem Solving: Slow processing, Requires verbal cues, Requires tactile cues, Decreased initiation, Difficulty sequencing General Comments: slowed processing and decreased awareness; pt requiring frequent redirection, poor insight.     General Comments  VSS on RA            Home Living Family/patient expects to be discharged to:: Private residence Living Arrangements:  Children Available Help at Discharge: Family;Available PRN/intermittently Type of Home: House Home Access: Stairs to enter [employer] of Steps: 8 Entrance Stairs-Rails: Right;Left Home Layout: One level     Bathroom Shower/Tub: Teacher, early years/pre: Standard Bathroom Accessibility: Yes   Home Equipment: Rollator (4 wheels);Shower seat;Wheelchair - manual   Additional Comments: inconsistent reports from home set-up provided last admission. pt reports living at home with daughter who works from home      Prior Functioning/Environment Prior Level of Function : Needs assist;Patient poor historian/Family not available             Mobility Comments: pt reports limited ambulator with rollator, also uses w/c. daughter assists on stairs. pt attends [facility] 1x/wk ADLs Comments: pt reports uses shower seat to bathe with assist from daughter; daughter assists with LB dressing. family does cooking, cleaning and transportation        OT Problem List: Decreased range of motion;Decreased strength;Decreased activity tolerance;Impaired balance (sitting and/or standing);Decreased knowledge of use of DME or AE;Decreased safety awareness;Decreased cognition;Decreased knowledge of precautions      OT Treatment/Interventions: Therapeutic exercise;Self-care/ADL training;DME and/or AE instruction;Therapeutic activities;Patient/family education;Balance training    OT Goals(Current goals can be found in the care plan section) Acute Rehab OT Goals Patient Stated Goal: to feel better OT Goal Formulation: With patient Potential to Achieve Goals: Good ADL Goals Pt Will Perform Grooming: standing;with min guard assist Pt Will Perform Lower Body Dressing: with min assist;sit to/from stand Pt Will Perform Toileting - Clothing Manipulation and hygiene: with mod assist;sit to/from stand Additional ADL Goal #1: pt will complete bed mobility with min A as a precursor to ADLs  OT  Frequency: Min 2X/week       AM-PAC OT "6 Clicks" Daily Activity     Outcome Measure Help from another  person eating meals?: A Little Help from another person taking care of personal grooming?: A Little Help from another person toileting, which includes using toliet, bedpan, or urinal?: A Lot Help from another person bathing (including washing, rinsing, drying)?: A Lot Help from another person to put on and taking off regular upper body clothing?: A Little Help from another person to put on and taking off regular lower body clothing?: A Lot 6 Click Score: 15   End of Session Equipment Utilized During Treatment: Gait belt;Rolling walker (2 wheels) Nurse Communication: Mobility status  Activity Tolerance: Patient tolerated treatment well Patient left: in chair;with call bell/phone within reach (no chair alarms on unit)  OT Visit Diagnosis: Unsteadiness on feet (R26.81);Other abnormalities of gait and mobility (R26.89);Muscle weakness (generalized) (M62.81);Pain                Time: 6256-3893 OT Time Calculation (min): 16 min Charges:  OT General Charges $OT Visit: 1 Visit OT Evaluation $OT Eval Moderate Complexity: 1 Mod   Gracelynne Benedict D Causey 11/30/2022, 4:18 PM

## 2022-12-01 DIAGNOSIS — R404 Transient alteration of awareness: Secondary | ICD-10-CM | POA: Diagnosis not present

## 2022-12-01 DIAGNOSIS — R509 Fever, unspecified: Secondary | ICD-10-CM | POA: Diagnosis not present

## 2022-12-01 LAB — URINALYSIS, ROUTINE W REFLEX MICROSCOPIC
Bacteria, UA: NONE SEEN
Bilirubin Urine: NEGATIVE
Glucose, UA: NEGATIVE mg/dL
Hgb urine dipstick: NEGATIVE
Ketones, ur: NEGATIVE mg/dL
Leukocytes,Ua: NEGATIVE
Nitrite: NEGATIVE
Protein, ur: 100 mg/dL — AB
Specific Gravity, Urine: 1.008 (ref 1.005–1.030)
pH: 8 (ref 5.0–8.0)

## 2022-12-01 LAB — CBC
HCT: 25.6 % — ABNORMAL LOW (ref 36.0–46.0)
Hemoglobin: 8.8 g/dL — ABNORMAL LOW (ref 12.0–15.0)
MCH: 34.4 pg — ABNORMAL HIGH (ref 26.0–34.0)
MCHC: 34.4 g/dL (ref 30.0–36.0)
MCV: 100 fL (ref 80.0–100.0)
Platelets: 127 10*3/uL — ABNORMAL LOW (ref 150–400)
RBC: 2.56 MIL/uL — ABNORMAL LOW (ref 3.87–5.11)
RDW: 14.7 % (ref 11.5–15.5)
WBC: 4.3 10*3/uL (ref 4.0–10.5)
nRBC: 0 % (ref 0.0–0.2)

## 2022-12-01 LAB — SEDIMENTATION RATE: Sed Rate: 75 mm/hr — ABNORMAL HIGH (ref 0–22)

## 2022-12-01 MED ORDER — CHLORHEXIDINE GLUCONATE CLOTH 2 % EX PADS: 6.0000 | MEDICATED_PAD | Freq: Every day | CUTANEOUS | Status: DC

## 2022-12-01 MED ORDER — PENTAFLUOROPROP-TETRAFLUOROETH EX AERO
INHALATION_SPRAY | CUTANEOUS | Status: AC
  Filled 2022-12-01: qty 30

## 2022-12-01 NOTE — Progress Notes (Signed)
Mobility Specialist: Progress Note   12/01/22 1020  Mobility  Activity Refused mobility   Pt refused mobility secondary to back and Rt flank pain. Will f/u as able.   [place] Jaree Trinka Mobility Specialist Please contact via SecureChat or Rehab office at [phone]

## 2022-12-01 NOTE — NC FL2 (Signed)
[place] LEVEL OF CARE FORM     IDENTIFICATION  Patient Name: Diamond [name] Birthdate: 08/06/1951 Sex: female Admission Date (Current Location): 11/29/2022  [facility] and [place] Number:  Herbalist and Address:  [facility] [place], [place] [address] [place], [place] [address]      Provider Number: [id number]  Attending Physician Name and Address:  Aldine Contes, MD  Relative Name and Phone Number:       Current Level of Care: Hospital Recommended Level of Care: [place] Prior Approval Number:    Date Approved/Denied:   PASRR Number: [id number]  Discharge Plan: SNF    Current Diagnoses: Patient Active Problem List   Diagnosis Date Noted   Altered mental status 11/30/2022   AMS (altered mental status) 11/30/2022   PAF (paroxysmal atrial fibrillation) ([place])    Thyroid nodule 09/17/2022   Empty sella ([place]) 09/17/2022   Discitis of lumbosacral region 07/26/2022   Hypertensive urgency 07/21/2022   Bacteremia 06/30/2022   Malnutrition of moderate degree 06/22/2022   Physical deconditioning 06/18/2022   Pressure injury of skin 06/16/2022   MRSA bacteremia 06/13/2022   ESRD on dialysis [facility])    Paroxysmal atrial flutter ([place])    Acute metabolic encephalopathy    Acute on chronic anemia with positive fecal occult  02/05/2022   Multiple polyps of sigmoid colon    Heme positive stool    Duodenitis    Candida esophagitis (HCC)    Gallbladder mass    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  01/16/2022   Septic discitis of lumbar region 01/16/2022   Hepatitis C without hepatic coma 01/15/2022   Sepsis due to methicillin resistant Staphylococcus aureus ([place]) 12/31/2021   Atrial flutter ([place]) 12/25/2021   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 seen on TEE on 1/17 12/24/2021   Lung nodule seen on imaging study 12/23/2021   Vertebral osteomyelitis ([place])    Sepsis due to  methicillin susceptible Staphylococcus aureus ([place]) 12/17/2021   Lower back pain 12/17/2021   ESRD on hemodialysis ([place]) 12/17/2021   Encounter for screening for other viral diseases 11/03/2021   Allergy, unspecified, initial encounter 09/14/2021   Coagulation defect, unspecified ([place]) 00/17/4944   Complication of vascular dialysis catheter 09/14/2021   Gout due to renal impairment, right ankle and foot 09/14/2021   Iron deficiency anemia, unspecified 09/14/2021   Pain, unspecified 09/14/2021   Pruritus, unspecified 09/14/2021   Secondary hyperparathyroidism of renal origin ([place]) 09/14/2021   Shortness of breath 09/14/2021   Unspecified protein-calorie malnutrition ([place]) 09/14/2021   Vitamin D deficiency 01/02/2021   Hyperparathyroidism ([place]) 12/31/2020   CKD (chronic kidney disease), stage III ([place]) 11/10/2019   Cardiomegaly 11/05/2019   Hyperkalemia 11/05/2019   Hypokalemia 10/18/2019   Pneumonia due to COVID-19 virus 10/17/2019   Hypertension    Asthma    Gout    Hyponatremia    Multifactorial anemia-acute blood loss from bleeding HD catheter site-superimposed on anemia related to ESRD.     Orientation RESPIRATION BLADDER Height & Weight     Self, Time, Situation, Place  Normal Continent Weight: 133 lb 2.5 oz (60.4 kg) Height:  '5\' 6"'$  (167.6 cm)  BEHAVIORAL SYMPTOMS/MOOD NEUROLOGICAL BOWEL NUTRITION STATUS      Continent Diet  AMBULATORY STATUS COMMUNICATION OF NEEDS Skin   Limited Assist Verbally Normal                       Personal Care Assistance  Level of Assistance  Bathing, Feeding, Dressing Bathing Assistance: Limited assistance Feeding assistance: Limited assistance Dressing Assistance: Limited assistance     Functional Limitations Info  Sight, Hearing, Speech Sight Info: Adequate Hearing Info: Adequate Speech Info: Adequate    SPECIAL CARE FACTORS FREQUENCY  PT (By licensed PT), OT (By licensed OT)     PT Frequency: 5x weekly OT Frequency: 5x  weekly            Contractures Contractures Info: Not present    Additional Factors Info  Code Status, Allergies, Isolation Precautions Code Status Info: Full Allergies Info: Shrimp (Diagnostic)     Isolation Precautions Info: MRSA     Current Medications (12/01/2022):  This is the current hospital active medication list Current Facility-Administered Medications  Medication Dose Route Frequency Provider Last Rate Last Admin   acetaminophen (TYLENOL) tablet 1,000 mg  1,000 mg Oral Q6H PRN Romana Juniper, MD   1,000 mg at 11/30/22 1653   apixaban (ELIQUIS) tablet 2.5 mg  2.5 mg Oral BID Idamae Schuller, MD   2.5 mg at 12/01/22 1443   ceFEPIme (MAXIPIME) 1 g in sodium chloride 0.9 % 100 mL IVPB  1 g Intravenous Q24H Aldine Contes, MD 200 mL/hr at 11/30/22 2346 1 g at 11/30/22 2346   Chlorhexidine Gluconate Cloth 2 % PADS 6 each  6 each Topical Q0600 Roney Jaffe, MD       hydrALAZINE (APRESOLINE) tablet 100 mg  100 mg Oral Q8H Idamae Schuller, MD   100 mg at 11/30/22 2044   HYDROmorphone (DILAUDID) tablet 1 mg  1 mg Oral Q6H PRN Atway, Rayann N, DO   1 mg at 12/01/22 1031   mupirocin ointment (BACTROBAN) 2 % 1 Application  1 Application Nasal BID Starlyn Skeans, MD   1 Application at 15/40/08 2044   pantoprazole (PROTONIX) EC tablet 40 mg  40 mg Oral Daily Idamae Schuller, MD   40 mg at 12/01/22 1031   sevelamer carbonate (RENVELA) tablet 1,600 mg  1,600 mg Oral TID WC Idamae Schuller, MD   1,600 mg at 12/01/22 1147   [START ON 12/02/2022] vancomycin (VANCOREADY) IVPB 500 mg/100 mL  500 mg Intravenous Q T,Th,Sa-HD Kris Mouton, [facility]         Discharge Medications: Please see discharge summary for a list of discharge medications.  Relevant Imaging Results:  Relevant Lab Results:   Additional Information SS#: [id number]; HD TTS at [facility], arrives 11:30 for 11:45 chair time  Archie Endo, LCSW

## 2022-12-01 NOTE — Progress Notes (Signed)
Received patient in bed to unit.  Alert and oriented.  Informed consent signed and in chart.   Treatment initiated: 1656 Treatment completed: 2006  Patient tolerated well.  Transported back to the room  Alert, without acute distress.  Hand-off given to patient's nurse.   Access used: fistula Access issues: none  Total UF removed: 2800 Medication(s) given: none Post HD VS: 98, 117/84(94), HR-100, RR-18, SP02-99 Post HD weight: 57.6kg: according to preweight of 57.8kg pt should weight 55kg   Ira Dougher A [facility]

## 2022-12-01 NOTE — Progress Notes (Addendum)
11:45am: CSW spoke with patient's son Marchia Bond who states the family is in agreement with short term rehab at [facility].  CSW will complete FL2 and fax patient's information out to obtain bed offers.  11:15am: CSW spoke with patient's daughter [name] to discuss discharge planning. [name] states she will speak with her brother Marchia Bond to determine if the family wants the patient placed in SNF or to return home with [facility].  [name], MSW, LCSW Transitions of Care  Clinical Social Worker II [phone]

## 2022-12-01 NOTE — Progress Notes (Signed)
Hospital day#1 Subjective:   Summary: Diamond [name] is a 71 y.o. with a pertinent PMH of HTN, ESRD on dialysis TTS, atrial flutter, hx lumbar osteomylitis and MRSA bacteremia with infectious endocarditis admitted for AMS, now resolved,  and fever of unknown source with lumbar paravertebral inflammation on MRI, concerning for lumbar osteomyelitis   Overnight Events: None  Patient feels poorly this AM. Increased back pain but is also now endorsing a new back pain in bilateral flanks that radiates into her abdomen and suprapubic area. She has not had dysuria, but endorses pain in her pelvis area. Mentioned tshe has a BM yesterday that was formed. No BM s today. No nausea, vomiting, decreased PO intake. No fevers or chills. No SOB or chest pains.   Objective:  Vital signs in last 24 hours: Vitals:   11/30/22 1950 12/01/22 0500 12/01/22 0646 12/01/22 0857  BP: (!) 155/90  (!) 147/84 137/88  Pulse: 73  89 84  Resp: _0 Temp: 98.9 F (37.2 C)  98.8 F (37.1 C) 98.3 F (36.8 C)  TempSrc: Oral  Oral Oral  SpO2: 100%  100% 100%  Weight:  60.4 kg    Height:       Supplemental O2: Room Air SpO2: 100 % Filed Weights   11/30/22 1303 12/01/22 0500  Weight: 57.1 kg 60.4 kg    Physical Exam:  Constitutional:chronically ill-appearing woman sitting in bed, in no acute distress HENT: normocephalic atraumatic, moist mucous membranes Neck: supple Cardiovascular: regular rate and rhythm, no m/r/g. LUE AVF with palpable thrill and +bruit Pulmonary/Chest: normal work of breathing on room air, lungs clear to auscultation bilaterally. No wheezing Abdominal: normal BS, soft, non-tender, non-distended. MSK: normal bulk and tone.moving all extremities Neurological: alert & oriented x 3, moving all extremities Skin: warm and dry Psych: Normal mood and affect  No intake or output data in the 24 hours ending 12/01/22 1056  Net IO Since Admission: 800 mL [12/01/22 1056]  Pertinent  Labs:    Latest Ref Rng & Units 12/01/2022    3:41 AM 11/30/2022    5:36 AM 11/29/2022    9:10 PM  CBC  WBC 4.0 - 10.5 K/uL 4.3  5.5    Hemoglobin 12.0 - 15.0 g/dL 8.8  9.4  10.9   Hematocrit 36.0 - 46.0 % 25.6  28.4  32.0   Platelets 150 - 400 K/uL 127  72         Latest Ref Rng & Units 11/29/2022    9:10 PM 11/29/2022    9:00 PM 10/05/2022   12:42 AM  CMP  Glucose 70 - 99 mg/dL 95  100  112   BUN 8 - 23 mg/dL 36  38  34   Creatinine 0.44 - 1.00 mg/dL 7.70  6.95  7.36   Sodium 135 - 145 mmol/L 137  137  143   Potassium 3.5 - 5.1 mmol/L 3.5  3.5  4.1   Chloride 98 - 111 mmol/L 99  96  97   CO2 22 - 32 mmol/L  25  28   Calcium 8.9 - 10.3 mg/dL  9.6  11.0   Total Protein 6.5 - 8.1 g/dL  6.9    Total Bilirubin 0.3 - 1.2 mg/dL  0.3    Alkaline Phos 38 - 126 U/L  48    AST 15 - 41 U/L  15    ALT 0 - 44 U/L  8      Imaging:  No results found.  Assessment/Plan:   Principal Problem:   Altered mental status Active Problems:   AMS (altered mental status)   Patient Summary: Diamond [name] is a 71 y.o. with a pertinent PMH of HTN, ESRD on dialysis TTS, atrial flutter, hx lumbar osteomylitis and MRSA bacteremia with infectious endocarditis who presented with AMS, now resolved and fever of unknown origin, admitted for further infectious work up.    AMS, resolved Fever on unknown source Hx of recurrent bacteremia 2/2 lumbar spine osteomyelitis Hx of infective endocarditis with R atrial vegetation Patient followed by ID outpatient for lumbar osteomyelitis. Last vanc at HD 11/15/2022. Worsening lumbar pain over past 2 weeks, none on admission. New flank pain today - see below. RUE fistula or skin exam without signs infection. No new murmur on exam. Initially with high suspicion for worsening osteomyelitis vs lumbar abscess. Lumbar MRI with unchanged osteomyelitis at L2-L3, L4-5 with worsening paravertebral inflammatory changes. Lactic acid elevation to >9.0, now resolved. No  Leukocytosis today. However, ESR 75 and CRP 16.2 on 12/27. No growth on cultures today On cefepime and vancomycin per pharmacy. Will touch base with ID for antibiotic management today  -Continue on Vancomycin per pharmacy and cefepime (12/25- -f/u ID consult today -F/u blood cultures x2 (12/26-, no growth at 24HR -F/u ESR and CRP -Pain management: Tylenol  New bilateral flank pain with radiation C/f pyelonephritis Patient endorsing bilateral flank pain today with radiation to her lower abdomen and suprapubic area. No nausea, vomiting, or chills. Afebrile since admission. No Loose BM in the past week, none since admission. Patient still makes urine; Admission U/A not collected, will collect today and evaluate to UTI. Since patient was febrile on admission, will consider CT renal to evaluate for pyelonephritis -U/A and culture pending -Consider Renal CT   ESRD HD on TTS Last HD on Sat 12/23. Nephrology consulted; patient planned for HD today -HD today -Continue Sevelamer 1600 mg TID  Atrial flutter Eliquis 2.5 mg BID  Hx R atrial mass and MRSA bacteremia TEE 12/22/2021 with right atrial/IVC globular mobile mass consistent with thombous/vegetation. Resolved on repeat echocardiogram. Finished IV antibiotics for this on 02/13/2022. No cardiac symptoms today.   HTN On hydralazine 100 mg  GERD  Protonix 40 mg  Diet: Renal with fluids VTE: On eliquis Code: Full PT/OT recs: pending Family Update: attempted. Will try again   Dispo: Anticipated discharge to  home  pending clinical improvement and antibiotic plan. Possibly discharge today.  Romana Juniper, MD Internal Medicine Resident PGY-1 Please contact the on call pager after 5 pm and on weekends at [phone].

## 2022-12-01 NOTE — Progress Notes (Signed)
[facility] Progress Note  Subjective: pt seen in room, c/o back pain coming around to her stomach. MRI showed persistent inflammation of there lumbar areas same areas as on prior imaging, maybe a bit worsening of inflammation in certain areas  Vitals:   11/30/22 1950 12/01/22 0500 12/01/22 0646 12/01/22 0857  BP: (!) 155/90  (!) 147/84 137/88  Pulse: 73  89 84  Resp: '18  16 20  '$ Temp: 98.9 F (37.2 C)  98.8 F (37.1 C) 98.3 F (36.8 C)  TempSrc: Oral  Oral Oral  SpO2: 100%  100% 100%  Weight:  60.4 kg    Height:        Exam: Gen elderly, deconditioned, pleasant No jvd or bruits Chest clear bilat to bases RRR no MRG Abd soft ntnd no mass or ascites +bs Ext no LE edema Neuro is alert, Ox 3 , nf    LUA AVF+bruit      Home meds include - eliquis, hydralazine 100 tid, protonix, sevelamer 2 ac tid, prns/ vits/ supps        OP HD: [place] TTS  4h  400/600   53.6kg  2/2 bath  Heparin none LUA AVF - last HD 12/23 , post wt 53.7kg - venofer 50 q wk - hectorol 2 mcg IV tiw - mircera 100 mcg q2, last 11/18 - last Hb 10.4 on 12/19     MRI - IMPRESSION:  Unchanged findings of discitis-osteomyelitis at L2-L3 and L4-L5 with worsening paravertebral inflammatory changes. No paravertebral or epidural abscess.   CXR - no active disease   Assessment/ Plan: Back pain / fever - cx's pending, temp was 101 in ED. Hx septic discitis multiple rounds in 2023.  IV abx started. MRI of back showed persistent changes c/w infection, possibly slightly worsening. Per pmd.  ESRD - on HD TTS. Has not missed OP HD. HD today off schedule.  HTN/ vol - BP's good, no vol excess on exam. Up 7kg by wts, doubt accuracy.  Anemia esrd - Hb 10s here, if Hb drops could use esa. Last given 11/18 at OP unit.  MBD ckd - CCa is a bit high at 10.4, will hold IV vdra for now. Phos in range, cont renvela as binder.   Hep C - per pmd Atrial fib - on eliquis, holding BB for low HR.      Rob  Diamond [name] 12/01/2022, 1:24 PM   Recent Labs  Lab 11/29/22 2100 11/29/22 2110 11/30/22 0536 11/30/22 2203 12/01/22 0341  HGB 10.0* 10.9* 9.4*  --  8.8*  ALBUMIN 2.9*  --   --   --   --   CALCIUM 9.6  --   --   --   --   PHOS  --   --   --  5.3*  --   CREATININE 6.95* 7.70*  --   --   --   K 3.5 3.5  --   --   --    No results for input(s): "IRON", "TIBC", "FERRITIN" in the last 168 hours. Inpatient medications:  apixaban  2.5 mg Oral BID   Chlorhexidine Gluconate Cloth  6 each Topical Q0600   hydrALAZINE  100 mg Oral Q8H   mupirocin ointment  1 Application Nasal BID   pantoprazole  40 mg Oral Daily   sevelamer carbonate  1,600 mg Oral TID WC    ceFEPime (MAXIPIME) IV 1 g (11/30/22 2346)   [START ON 12/02/2022] vancomycin     acetaminophen, HYDROmorphone

## 2022-12-02 DIAGNOSIS — Z992 Dependence on renal dialysis: Secondary | ICD-10-CM | POA: Diagnosis not present

## 2022-12-02 DIAGNOSIS — N186 End stage renal disease: Secondary | ICD-10-CM

## 2022-12-02 DIAGNOSIS — M4646 Discitis, unspecified, lumbar region: Secondary | ICD-10-CM

## 2022-12-02 DIAGNOSIS — R509 Fever, unspecified: Principal | ICD-10-CM

## 2022-12-02 LAB — RENAL FUNCTION PANEL
Albumin: 2.3 g/dL — ABNORMAL LOW (ref 3.5–5.0)
Anion gap: 11 (ref 5–15)
BUN: 28 mg/dL — ABNORMAL HIGH (ref 8–23)
CO2: 28 mmol/L (ref 22–32)
Calcium: 9.2 mg/dL (ref 8.9–10.3)
Chloride: 100 mmol/L (ref 98–111)
Creatinine, Ser: 5.33 mg/dL — ABNORMAL HIGH (ref 0.44–1.00)
GFR, Estimated: 8 mL/min — ABNORMAL LOW (ref >60)
Glucose, Bld: 98 mg/dL (ref 70–99)
Phosphorus: 3.9 mg/dL (ref 2.5–4.6)
Potassium: 3.9 mmol/L (ref 3.5–5.1)
Sodium: 139 mmol/L (ref 135–145)

## 2022-12-02 LAB — CBC
HCT: 25.9 % — ABNORMAL LOW (ref 36.0–46.0)
Hemoglobin: 8.5 g/dL — ABNORMAL LOW (ref 12.0–15.0)
MCH: 33.2 pg (ref 26.0–34.0)
MCHC: 32.8 g/dL (ref 30.0–36.0)
MCV: 101.2 fL — ABNORMAL HIGH (ref 80.0–100.0)
Platelets: 138 10*3/uL — ABNORMAL LOW (ref 150–400)
RBC: 2.56 MIL/uL — ABNORMAL LOW (ref 3.87–5.11)
RDW: 14.8 % (ref 11.5–15.5)
WBC: 3.6 10*3/uL — ABNORMAL LOW (ref 4.0–10.5)
nRBC: 0 % (ref 0.0–0.2)

## 2022-12-02 LAB — CBC WITH DIFFERENTIAL/PLATELET
Abs Immature Granulocytes: 0.01 10*3/uL (ref 0.00–0.07)
Basophils Absolute: 0 10*3/uL (ref 0.0–0.1)
Basophils Relative: 1 %
Eosinophils Absolute: 0.2 10*3/uL (ref 0.0–0.5)
Eosinophils Relative: 6 %
HCT: 25.8 % — ABNORMAL LOW (ref 36.0–46.0)
Hemoglobin: 8.7 g/dL — ABNORMAL LOW (ref 12.0–15.0)
Immature Granulocytes: 0 %
Lymphocytes Relative: 22 %
Lymphs Abs: 0.8 10*3/uL (ref 0.7–4.0)
MCH: 34 pg (ref 26.0–34.0)
MCHC: 33.7 g/dL (ref 30.0–36.0)
MCV: 100.8 fL — ABNORMAL HIGH (ref 80.0–100.0)
Monocytes Absolute: 0.4 10*3/uL (ref 0.1–1.0)
Monocytes Relative: 12 %
Neutro Abs: 2.1 10*3/uL (ref 1.7–7.7)
Neutrophils Relative %: 59 %
Platelets: 141 10*3/uL — ABNORMAL LOW (ref 150–400)
RBC: 2.56 MIL/uL — ABNORMAL LOW (ref 3.87–5.11)
RDW: 14.7 % (ref 11.5–15.5)
WBC: 3.6 10*3/uL — ABNORMAL LOW (ref 4.0–10.5)
nRBC: 0 % (ref 0.0–0.2)

## 2022-12-02 LAB — URINE CULTURE: Culture: NO GROWTH

## 2022-12-02 LAB — C-REACTIVE PROTEIN: CRP: 12 mg/dL — ABNORMAL HIGH (ref <1.0)

## 2022-12-02 LAB — SEDIMENTATION RATE: Sed Rate: 82 mm/hr — ABNORMAL HIGH (ref 0–22)

## 2022-12-02 LAB — HEPATITIS B SURFACE ANTIBODY, QUANTITATIVE: Hep B S AB Quant (Post): 6.8 m[IU]/mL — ABNORMAL LOW (ref >9.9)

## 2022-12-02 MED ORDER — SULFAMETHOXAZOLE-TRIMETHOPRIM 800-160 MG PO TABS
1.0000 | ORAL_TABLET | Freq: Every day | ORAL | Status: AC
  Administered 2022-12-02: 1 via ORAL
  Filled 2022-12-02: qty 1

## 2022-12-02 MED ORDER — VANCOMYCIN HCL 500 MG/100ML IV SOLN
500.0000 mg | Freq: Once | INTRAVENOUS | Status: AC
  Administered 2022-12-02: 500 mg via INTRAVENOUS
  Filled 2022-12-02: qty 100

## 2022-12-02 MED ORDER — SULFAMETHOXAZOLE-TRIMETHOPRIM 400-80 MG PO TABS
1.0000 | ORAL_TABLET | Freq: Every day | ORAL | Status: DC
  Administered 2022-12-03 – 2022-12-08 (×5): 1 via ORAL
  Filled 2022-12-02 (×7): qty 1

## 2022-12-02 NOTE — Progress Notes (Signed)
[facility] Progress Note  Subjective: pt seen in room, c/o back pain again, no other c/os'  Vitals:   12/01/22 2038 12/02/22 0500 12/02/22 0513 12/02/22 0920  BP:   (!) 151/103 122/86  Pulse:   98 98  Resp:   17   Temp:   98 F (36.7 C) 98.7 F (37.1 C)  TempSrc:    Oral  SpO2:   100% 100%  Weight: 57.6 kg 57.6 kg    Height:        Exam: Gen elderly, deconditioned, pleasant No jvd or bruits Chest clear bilat to bases RRR no MRG Abd soft ntnd no mass or ascites +bs Ext no LE edema Neuro is alert, mostly oriented , nf    LUA AVF+bruit      Home meds include - eliquis, hydralazine 100 tid, protonix, sevelamer 2 ac tid, prns/ vits/ supps        OP HD: [place] [facility]  4h  400/600   53.6kg  2/2 bath  Heparin none LUA AVF - last HD 12/23 , post wt 53.7kg - venofer 50 q wk - hectorol 2 mcg IV tiw - mircera 100 mcg q2, last 11/18 - last Hb 10.4 on 12/19     MRI - IMPRESSION:  Unchanged findings of discitis-osteomyelitis at L2-L3 and L4-L5 with worsening paravertebral inflammatory changes. No paravertebral or epidural abscess.   CXR - no active disease   Assessment/ Plan: Back pain / fever - cx's pending, temp was 101 in ED. Hx septic discitis multiple rounds in 2023.  IV abx started. MRI showed unchanged osteo at L2-L3, L4-5, possibly slight worsening. Started on IV vanc/ cefepime. LA 9 >> 0 now. Blood cx's neg, and is now afebrile. ESR and CRP high. ID consulting.    ESRD - on HD TTS. Missed Tuesday HD, had HD here Wed night. Next HD today, possibly tomorrow due to staffing/ pt mismatch.  HTN/ vol - BP's good, no vol excess on exam. Up 4kg by wts after 2.8 L UF yesterday. UFG 2-3 L next HD.  Anemia esrd - Hb 10s here, if Hb drops could use esa. Last given 11/18 at OP unit.  MBD ckd - CCa is a bit high at 10.4, holding IV vdra for now. Phos in range, cont renvela as binder.   Hep C - per pmd Atrial fib - on eliquis, holding BB for low HR.      Rob  Baldemar Dady 12/02/2022, 2:48 PM   Recent Labs  Lab 11/29/22 2100 11/29/22 2110 11/30/22 0536 11/30/22 2203 12/01/22 0341 12/02/22 0540  HGB 10.0* 10.9*   < >  --  8.8* 8.7*  8.5*  ALBUMIN 2.9*  --   --   --   --  2.3*  CALCIUM 9.6  --   --   --   --  9.2  PHOS  --   --   --  5.3*  --  3.9  CREATININE 6.95* 7.70*  --   --   --  5.33*  K 3.5 3.5  --   --   --  3.9   < > = values in this interval not displayed.    No results for input(s): "IRON", "TIBC", "FERRITIN" in the last 168 hours. Inpatient medications:  apixaban  2.5 mg Oral BID   Chlorhexidine Gluconate Cloth  6 each Topical Q0600   hydrALAZINE  100 mg Oral Q8H   mupirocin ointment  1 Application Nasal BID   pantoprazole  40  mg Oral Daily   sevelamer carbonate  1,600 mg Oral TID WC   sulfamethoxazole-trimethoprim  1 tablet Oral QHS   Followed by   Derrill Memo ON 12/03/2022] sulfamethoxazole-trimethoprim  1 tablet Oral QHS     acetaminophen, HYDROmorphone

## 2022-12-02 NOTE — Progress Notes (Signed)
Hospital day#2 Subjective:   Summary: Diamond [name] is a 71 y.o. with a pertinent PMH of HTN, ESRD on dialysis TTS, atrial flutter, hx lumbar osteomylitis and MRSA bacteremia with infectious endocarditis admitted for AMS, now resolved,  and fever of unknown source with lumbar paravertebral inflammation on MRI, concerning for lumbar osteomyelitis   Overnight Events: None  Patient reports pain at her hips and buttocks bilaterally. This is worse with certain positioning. Endorses good urination and regular bowel movements. Denies nausea, vomiting, appetite suppression. Denies SOB or chest pain.  Objective:  Vital signs in last 24 hours: Vitals:   12/01/22 2038 12/02/22 0500 12/02/22 0513 12/02/22 0920  BP:   (!) 151/103 122/86  Pulse:   98 98  Resp:   17   Temp:   98 F (36.7 C) 98.7 F (37.1 C)  TempSrc:    Oral  SpO2:   100% 100%  Weight: 57.6 kg 57.6 kg    Height:       Supplemental O2: Room Air SpO2: 100 % Filed Weights   12/01/22 1637 12/01/22 2038 12/02/22 0500  Weight: 57.8 kg 57.6 kg 57.6 kg    Physical Exam:  Constitutional:chronically ill-appearing woman sitting in bed, in no acute distress HENT: normocephalic atraumatic, moist mucous membranes Neck: supple Cardiovascular: regular rate and rhythm, no m/r/g.  Pulmonary/Chest: normal work of breathing on room air MSK: normal bulk and tone. Flexion of right knee elicits pain and reduces ROM. Neurological: alert & oriented x 3, moving all extremities. Strength and sensation intact at bilateral lower extremities. Skin: warm and dry Psych: Normal mood and affect   Intake/Output Summary (Last 24 hours) at 12/02/2022 1245 Last data filed at 12/02/2022 0800 Gross per 24 hour  Intake 360 ml  Output 2800 ml  Net -2440 ml    Net IO Since Admission: -1,640 mL [12/02/22 1245]  Pertinent Labs:    Latest Ref Rng & Units 12/02/2022    5:40 AM 12/01/2022    3:41 AM 11/30/2022    5:36 AM  CBC  WBC 4.0 - 10.5  K/uL 4.0 - 10.5 K/uL 3.6    3.6  4.3  5.5   Hemoglobin 12.0 - 15.0 g/dL 12.0 - 15.0 g/dL 8.5    8.7  8.8  9.4   Hematocrit 36.0 - 46.0 % 36.0 - 46.0 % 25.9    25.8  25.6  28.4   Platelets 150 - 400 K/uL 150 - 400 K/uL 138    141  127  72        Latest Ref Rng & Units 12/02/2022    5:40 AM 11/29/2022    9:10 PM 11/29/2022    9:00 PM  CMP  Glucose 70 - 99 mg/dL 98  95  100   BUN 8 - 23 mg/dL 28  36  38   Creatinine 0.44 - 1.00 mg/dL 5.33  7.70  6.95   Sodium 135 - 145 mmol/L 139  137  137   Potassium 3.5 - 5.1 mmol/L 3.9  3.5  3.5   Chloride 98 - 111 mmol/L 100  99  96   CO2 22 - 32 mmol/L 28   25   Calcium 8.9 - 10.3 mg/dL 9.2   9.6   Total Protein 6.5 - 8.1 g/dL   6.9   Total Bilirubin 0.3 - 1.2 mg/dL   0.3   Alkaline Phos 38 - 126 U/L   48   AST 15 - 41 U/L  15   ALT 0 - 44 U/L   8     Imaging: No results found.  Assessment/Plan:   Principal Problem:   Altered mental status Active Problems:   AMS (altered mental status)   Patient Summary: Diamond [name] is a 71 y.o. with a pertinent PMH of HTN, ESRD on dialysis TTS, atrial flutter, hx lumbar osteomylitis and MRSA bacteremia with infectious endocarditis who presented with AMS, now resolved, and fever of unknown origin, admitted for further infectious work up.    AMS, resolved Fever of unknown source Hx of recurrent bacteremia 2/2 lumbar spine osteomyelitis Hx of infective endocarditis with R atrial vegetation Patient followed by ID outpatient for lumbar osteomyelitis. Last vanc before admission at HD 11/15/2022. Worsening lumbar pain over past 2 weeks, none on admission. New flank pain 12/27 - see below. Initially with high suspicion for worsening osteomyelitis vs lumbar abscess. Lumbar MRI with unchanged osteomyelitis at L2-L3, L4-5 with worsening paravertebral inflammatory changes. ESR 75 and CRP 16.2 on 12/27. Started on vancomycin and cefepime. Lactic acid initially elevated to >9.0, now wnl. Remains  afebrile today without leukocytosis. No growth on cultures x3 days. ESR now 82, CRP 12. ID has been consulted for antibiotic management. -ID consulted, appreciate recommendations -Continue on Vancomycin per pharmacy and cefepime (12/25- -F/u blood cultures x2 (12/26-, no growth x 3 days -Pain management: Tylenol -has SNF bed  New bilateral flank pain with radiation C/f pyelonephritis Patient endorsing bilateral flank pain 12/27 with radiation to her lower abdomen and suprapubic area. Today this pain seems to localize to hips / lateral lumbar area. No nausea, vomiting, or chills. No red flag symptoms. Afebrile since admission. UA clean. Feel this pain is most likely radicular in etiology. -f/u urine culture   ESRD HD on TTS Underwent HD 12/27. Tolerated well. -Continue Sevelamer 1600 mg TID  Atrial flutter Eliquis 2.5 mg BID  Hx R atrial mass and MRSA bacteremia TEE 12/22/2021 with right atrial/IVC globular mobile mass consistent with thombous/vegetation. Resolved on repeat echocardiogram. Finished IV antibiotics for this on 02/13/2022. No cardiac symptoms today.   HTN On hydralazine 100 mg  GERD  Protonix 40 mg  Diet: Renal with fluids VTE: On eliquis Code: Full PT/OT recs: pending Family Update: attempted. Will try again   Dispo: Anticipated discharge to  home  pending clinical improvement and antibiotic plan. Possibly discharge today.  Linward Natal, MD Internal Medicine Resident PGY-1 Please contact the on call pager after 5 pm and on weekends at [phone].

## 2022-12-02 NOTE — Progress Notes (Signed)
CSW spoke with patient's son Marchia Bond who states he wants to accept the bed offer from [facility].  CSW notified Lexine Baton at [facility] of information. Lexine Baton states the facility can accept her into the facility tomorrow.  CSW spoke with Olivia Mackie @ [facility] to inform her of information.   [name], MSW, LCSW Transitions of Care  Clinical Social Worker II [phone]

## 2022-12-02 NOTE — Consult Note (Signed)
[facility]    Date of Admission:  11/29/2022    Total days of antibiotics 2               Reason for Consult: MRSA discitis / OM, fever     Referring Provider: Dareen Piano  Primary Care Provider: [facility]     Assessment: Diamond [name] is a 71 y.o. 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 Gale Journey at [facility] for management.  Not clear as to what caused her fever. She is overall well appearing, despite some discomfort in her back. Possible she had a viral illness, which can also make her back pain acutely worsened. Her lumbar MRI does not show any progression of vertebral disease on non-contrasted study; not surprising she has ongoing radiographic findings of OM at this stage; even with adequate treatment this can persist on radiography.  She does have some non-specific worsened paravertebral inflammatory changes without any abscesses. Unchanged moderate to severe spinal canal stenosis at L3-4 with mild/moderate stenosis with adjacent levels.  She is not bacteremic. I think we can put her on suppressive bactrim 1 SS daily for her vertebral infection another 4 weeks to bridge her to see Dr. Gale Journey.  Her pain is not so much flank pain but more the same lumbar back pain she has had before, just of intermittent quality. Her U/A does not suggest any infection either.    Plan: Start bactrim 1 ss daily for suppression of chronic osteomyelitis vertebral infection  Stop other IV ABx Will arrange follow up with Dr. Gale Journey outpatient.   Ok from ID standpoint for discharge (?SNF)  Principal Problem:   Altered mental status Active Problems:   AMS (altered mental status)    apixaban  2.5 mg Oral BID   Chlorhexidine Gluconate Cloth  6 each Topical Q0600   hydrALAZINE  100 mg Oral Q8H   mupirocin ointment  1 Application Nasal BID   pantoprazole  40 mg Oral Daily   sevelamer carbonate  1,600  mg Oral TID WC   sulfamethoxazole-trimethoprim  1 tablet Oral QHS   Followed by   Derrill Memo ON 12/03/2022] sulfamethoxazole-trimethoprim  1 tablet Oral QHS    HPI: Diamond [name] is a 71 y.o. female admitted from home for altered mental status.   PMHx of MRSA bacteremia (07-2022) and subsequent native valve endocarditis and lumbar discktis/OM.   Her daughter brought her to the ER for AMS - reported a day of confusion. Had a fever her in the hospital. She reported to ER team that her back pan was worsened over the last 2 weeks. She clarifies with [place] that it waxes and wanes but has been ongoing for 2 years. What is making it worse is being in the bed here in the hospital.  She recently completed 8 weeks of IV vancomycin following HD 12/11. Missed ID follow up with Dr. Gale Journey x 2 for follow up and consideration for transition to oral treatment.   She cannot really explain her fever outside of maybe something viral. She is exposed to a lot of people going to HD 3d a week.   She has chronic hepatitis C infection that has not been treated yet.    Review of Systems: Review of Systems  Constitutional:  Positive for fever. Negative for chills.    Past Medical History:  Diagnosis Date   Anemia  Asthma    Atrial flutter with rapid ventricular response ([place]) 12/25/2021   Chronic kidney disease    dialysis Tues Thurs Sat   Gout    Hypertension     Social History   Tobacco Use   Smoking status: Never    Passive exposure: Never   Smokeless tobacco: Never  Vaping Use   Vaping Use: Never used  Substance Use Topics   Alcohol use: No   Drug use: No    Family History  Problem Relation Age of Onset   Hypertension Mother    Hypertension Father    Colon cancer Brother    Lung cancer Brother    Allergies  Allergen Reactions   Shrimp (Diagnostic) Other (See Comments)    Unknown reaction   Other Swelling and Rash    Anesthesia - facial swelling, rash    OBJECTIVE: Blood pressure (!)  146/84, pulse 79, temperature (!) 97.5 F (36.4 C), temperature source Oral, resp. rate 17, height '5\' 6"'$  (1.676 m), weight 57.6 kg, SpO2 100 %.  Physical Exam Constitutional:      General: She is not in acute distress.    Appearance: She is not ill-appearing or toxic-appearing.  HENT:     Mouth/Throat:     Mouth: Mucous membranes are moist.     Pharynx: Oropharynx is clear. No oropharyngeal exudate.  Eyes:     General: No scleral icterus.    Conjunctiva/sclera: Conjunctivae normal.  Cardiovascular:     Rate and Rhythm: Normal rate and regular rhythm.  Skin:    General: Skin is warm and dry.  Neurological:     Mental Status: She is alert and oriented to person, place, and time.     Lab Results Lab Results  Component Value Date   WBC 3.6 (L) 12/02/2022   WBC 3.6 (L) 12/02/2022   HGB 8.5 (L) 12/02/2022   HGB 8.7 (L) 12/02/2022   HCT 25.9 (L) 12/02/2022   HCT 25.8 (L) 12/02/2022   MCV 101.2 (H) 12/02/2022   MCV 100.8 (H) 12/02/2022   PLT 138 (L) 12/02/2022   PLT 141 (L) 12/02/2022    Lab Results  Component Value Date   CREATININE 5.33 (H) 12/02/2022   BUN 28 (H) 12/02/2022   NA 139 12/02/2022   K 3.9 12/02/2022   CL 100 12/02/2022   CO2 28 12/02/2022    Lab Results  Component Value Date   ALT 8 11/29/2022   AST 15 11/29/2022   ALKPHOS 48 11/29/2022   BILITOT 0.3 11/29/2022     Microbiology: Recent Results (from the past 240 hour(s))  Culture, blood (routine x 2)     Status: None (Preliminary result)   Collection Time: 11/29/22  9:00 PM   Specimen: BLOOD  Result Value Ref Range Status   Specimen Description BLOOD RIGHT ANTECUBITAL  Final   Special Requests   Final    BOTTLES DRAWN AEROBIC AND ANAEROBIC Blood Culture adequate volume   Culture   Final    NO GROWTH 3 DAYS Performed at [facility], [place] [address], [place], [place] [address]    Report Status PENDING  Incomplete  Resp panel by RT-PCR (RSV, Flu A&B, Covid) Anterior Nasal Swab     Status:  None   Collection Time: 11/29/22  9:11 PM   Specimen: Anterior Nasal Swab  Result Value Ref Range Status   SARS Coronavirus 2 by RT PCR NEGATIVE NEGATIVE Final    Comment: (NOTE) SARS-CoV-2 target nucleic acids are NOT  DETECTED.  The SARS-CoV-2 RNA is generally detectable in upper respiratory specimens during the acute phase of infection. The lowest concentration of SARS-CoV-2 viral copies this assay can detect is 138 copies/mL. A negative result does not preclude SARS-Cov-2 infection and should not be used as the sole basis for treatment or other patient management decisions. A negative result may occur with  improper specimen collection/handling, submission of specimen other than nasopharyngeal swab, presence of viral mutation(s) within the areas targeted by this assay, and inadequate number of viral copies(<138 copies/mL). A negative result must be combined with clinical observations, patient history, and epidemiological information. The expected result is Negative.  Fact Sheet for Patients:  EntrepreneurPulse.com.au  Fact Sheet for Healthcare Providers:  IncredibleEmployment.be  This test is no t yet approved or cleared by the [place] FDA and  has been authorized for detection and/or diagnosis of SARS-CoV-2 by FDA under an Emergency Use Authorization (EUA). This EUA will remain  in effect (meaning this test can be used) for the duration of the COVID-19 declaration under Section 564(b)(1) of the Act, 21 U.S.C.section 360bbb-3(b)(1), unless the authorization is terminated  or revoked sooner.       Influenza A by PCR NEGATIVE NEGATIVE Final   Influenza B by PCR NEGATIVE NEGATIVE Final    Comment: (NOTE) The Xpert Xpress SARS-CoV-2/FLU/RSV plus assay is intended as an aid in the diagnosis of influenza from Nasopharyngeal swab specimens and should not be used as a sole basis for treatment. Nasal washings and aspirates are unacceptable  for Xpert Xpress SARS-CoV-2/FLU/RSV testing.  Fact Sheet for Patients: EntrepreneurPulse.com.au  Fact Sheet for Healthcare Providers: IncredibleEmployment.be  This test is not yet approved or cleared by the [place] FDA and has been authorized for detection and/or diagnosis of SARS-CoV-2 by FDA under an Emergency Use Authorization (EUA). This EUA will remain in effect (meaning this test can be used) for the duration of the COVID-19 declaration under Section 564(b)(1) of the Act, 21 U.S.C. section 360bbb-3(b)(1), unless the authorization is terminated or revoked.     Resp Syncytial Virus by PCR NEGATIVE NEGATIVE Final    Comment: (NOTE) Fact Sheet for Patients: EntrepreneurPulse.com.au  Fact Sheet for Healthcare Providers: IncredibleEmployment.be  This test is not yet approved or cleared by the [place] FDA and has been authorized for detection and/or diagnosis of SARS-CoV-2 by FDA under an Emergency Use Authorization (EUA). This EUA will remain in effect (meaning this test can be used) for the duration of the COVID-19 declaration under Section 564(b)(1) of the Act, 21 U.S.C. section 360bbb-3(b)(1), unless the authorization is terminated or revoked.  Performed at [facility], [place] [address], [place], [place] [address]   MRSA Next Gen by PCR, Nasal     Status: Abnormal   Collection Time: 11/30/22  5:00 PM   Specimen: Nasal Mucosa; Nasal Swab  Result Value Ref Range Status   MRSA by PCR Next Gen DETECTED (A) NOT DETECTED Final    Comment: RESULT CALLED TO, READ BACK BY AND VERIFIED WITH: RN Dorthula Nettles [phone] '@[id number]'$  FH (NOTE) The GeneXpert MRSA Assay (FDA approved for NASAL specimens only), is one component of a comprehensive MRSA colonization surveillance program. It is not intended to diagnose MRSA infection nor to guide or monitor treatment for MRSA infections. Test performance is  not FDA approved in patients less than 26 years old. Performed at [facility], [place] [address] [place], [place] [address]   Urine Culture     Status: None  Collection Time: 12/01/22 11:14 AM   Specimen: Urine, Clean Catch  Result Value Ref Range Status   Specimen Description URINE, CLEAN CATCH  Final   Special Requests NONE  Final   Culture   Final    NO GROWTH Performed at [facility], [address] [place], [place] [address]    Report Status 12/02/2022 FINAL  Final    Janene Madeira, MSN, NP-C [facility] Pager: [phone]  12/02/2022 4:17 PM

## 2022-12-02 NOTE — Progress Notes (Signed)
Physical Therapy Treatment Patient Details Name: Diamond [name] MRN: [id number] DOB: 04-23-1951 Today's Date: 12/02/2022   History of Present Illness Pt is a 71 y.o. female admitted 11/29/22 with AMS and fever of unknown source concerning for lumbar osteomyelitis vs abscess. MRI 12/26 with unchanged findings of discitis-osteomyelitis L2-L5; no abscess. Head CT negative for acute injury; remote lacunar infarcts in R basal ganglia and L insular cortex. Of note, recent admission 09/2022 with progression of lumbar discitis/osteomyelitis. Other PMH includes asthma, aflutter, ESRD (HD TTS), HTN, gout, MRSA bacteremia with infectious endocarditis.    PT Comments    Patient with increased pain today compared to last PT visit. Patient required max assist to come to partial sitting on EOB (pt remain propped on rt elbow and resisted coming up to neutral due to back pain). Unable to attempt transfer. Will attempt to have pt pre-medicated for pain prior to next session.     Recommendations for follow up therapy are one component of a multi-disciplinary discharge planning process, led by the attending physician.  Recommendations may be updated based on patient status, additional functional criteria and insurance authorization.  Follow Up Recommendations  Skilled nursing-short term rehab (<3 hours/day) Can patient physically be transported by private vehicle: No   Assistance Recommended at Discharge Frequent or constant Supervision/Assistance  Patient can return home with the following A lot of help with walking and/or transfers;A lot of help with bathing/dressing/bathroom;Assistance with cooking/housework;Direct supervision/assist for medications management;Direct supervision/assist for financial management;Assist for transportation;Help with stairs or ramp for entrance   Equipment Recommendations  None recommended by PT    Recommendations for Other Services       Precautions / Restrictions  Precautions Precautions: Fall;Other (comment) Precaution Comments: bladder/bowel incontinence Restrictions Weight Bearing Restrictions: No     Mobility  Bed Mobility Overal bed mobility: Needs Assistance Bed Mobility: Rolling, Sidelying to Sit, Sit to Sidelying Rolling: Supervision Sidelying to sit: Max assist     Sit to sidelying: Min assist General bed mobility comments: pt resisting coming to full sitting, instead stopping when propped on rt elbow and stated too painful to go any farther    Transfers                   General transfer comment: unable    Ambulation/Gait                   Stairs             Wheelchair Mobility    Modified Rankin (Stroke Patients Only)       Balance Overall balance assessment: Needs assistance Sitting-balance support: No upper extremity supported, Feet supported Sitting balance-Leahy Scale: Poor                                      Cognition Arousal/Alertness: Awake/alert Behavior During Therapy: Flat affect Overall Cognitive Status: No family/caregiver present to determine baseline cognitive functioning Area of Impairment: Attention, Memory, Following commands, Safety/judgement, Awareness, Problem solving, Orientation                 Orientation Level: Place, Time, Situation Current Attention Level: Selective   Following Commands: Follows one step commands inconsistently, Follows one step commands with increased time Safety/Judgement: Decreased awareness of deficits, Decreased awareness of safety Awareness: Intellectual Problem Solving: Slow processing, Requires verbal cues, Requires tactile cues, Decreased initiation, Difficulty sequencing General Comments: slowed processing and decreased  awareness; pt requiring frequent redirection, poor insight.        Exercises      General Comments        Pertinent Vitals/Pain Pain Assessment Pain Assessment: Faces Faces Pain  Scale: Hurts whole lot Pain Location: back Pain Descriptors / Indicators: Discomfort Pain Intervention(s): Limited activity within patient's tolerance, Monitored during session, Repositioned    Home Living                          Prior Function            PT Goals (current goals can now be found in the care plan section) Acute Rehab PT Goals Patient Stated Goal: return home, willing to consider ST rehab Time For Goal Achievement: 12/14/22 Potential to Achieve Goals: Fair Progress towards PT goals: Not progressing toward goals - comment (limited by pain)    Frequency    Min 2X/week      PT Plan Current plan remains appropriate    Co-evaluation              AM-PAC PT "6 Clicks" Mobility   Outcome Measure  Help needed turning from your back to your side while in a flat bed without using bedrails?: A Lot Help needed moving from lying on your back to sitting on the side of a flat bed without using bedrails?: Total Help needed moving to and from a bed to a chair (including a wheelchair)?: Total Help needed standing up from a chair using your arms (e.g., wheelchair or bedside chair)?: Total Help needed to walk in hospital room?: Total Help needed climbing 3-5 steps with a railing? : Total 6 Click Score: 7    End of Session Equipment Utilized During Treatment: Gait belt Activity Tolerance: Patient limited by pain Patient left: with call bell/phone within reach;in bed;with bed alarm set;Other (comment) (with MD) Nurse Communication: Mobility status PT Visit Diagnosis: Other abnormalities of gait and mobility (R26.89);Muscle weakness (generalized) (M62.81)     Time: 8416-6063 PT Time Calculation (min) (ACUTE ONLY): 21 min  Charges:  $Therapeutic Activity: 8-22 mins                      Arby Barrette, PT Acute Rehabilitation Services  Office [phone]    Rexanne Mano 12/02/2022, 11:31 AM

## 2022-12-02 NOTE — Progress Notes (Signed)
Pt receives out-pt HD at [place] on TTS. Pt arrives at 11:30 for 11:45 chair time. Will assist as needed.   Melven Sartorius Renal Navigator [phone]

## 2022-12-03 ENCOUNTER — Ambulatory Visit: Payer: Medicare HMO | Admitting: Internal Medicine

## 2022-12-03 DIAGNOSIS — R509 Fever, unspecified: Secondary | ICD-10-CM | POA: Diagnosis not present

## 2022-12-03 DIAGNOSIS — R4182 Altered mental status, unspecified: Secondary | ICD-10-CM | POA: Diagnosis not present

## 2022-12-03 DIAGNOSIS — N186 End stage renal disease: Secondary | ICD-10-CM | POA: Diagnosis not present

## 2022-12-03 DIAGNOSIS — Z992 Dependence on renal dialysis: Secondary | ICD-10-CM | POA: Diagnosis not present

## 2022-12-03 LAB — CBC WITH DIFFERENTIAL/PLATELET
Abs Immature Granulocytes: 0.02 10*3/uL (ref 0.00–0.07)
Basophils Absolute: 0 10*3/uL (ref 0.0–0.1)
Basophils Relative: 0 %
Eosinophils Absolute: 0.2 10*3/uL (ref 0.0–0.5)
Eosinophils Relative: 4 %
HCT: 25.6 % — ABNORMAL LOW (ref 36.0–46.0)
Hemoglobin: 8.8 g/dL — ABNORMAL LOW (ref 12.0–15.0)
Immature Granulocytes: 0 %
Lymphocytes Relative: 19 %
Lymphs Abs: 0.9 10*3/uL (ref 0.7–4.0)
MCH: 33.8 pg (ref 26.0–34.0)
MCHC: 34.4 g/dL (ref 30.0–36.0)
MCV: 98.5 fL (ref 80.0–100.0)
Monocytes Absolute: 0.6 10*3/uL (ref 0.1–1.0)
Monocytes Relative: 12 %
Neutro Abs: 2.9 10*3/uL (ref 1.7–7.7)
Neutrophils Relative %: 65 %
Platelets: 152 10*3/uL (ref 150–400)
RBC: 2.6 MIL/uL — ABNORMAL LOW (ref 3.87–5.11)
RDW: 15 % (ref 11.5–15.5)
WBC: 4.6 10*3/uL (ref 4.0–10.5)
nRBC: 0 % (ref 0.0–0.2)

## 2022-12-03 LAB — RENAL FUNCTION PANEL
Albumin: 2.4 g/dL — ABNORMAL LOW (ref 3.5–5.0)
Anion gap: 15 (ref 5–15)
BUN: 38 mg/dL — ABNORMAL HIGH (ref 8–23)
CO2: 26 mmol/L (ref 22–32)
Calcium: 10 mg/dL (ref 8.9–10.3)
Chloride: 95 mmol/L — ABNORMAL LOW (ref 98–111)
Creatinine, Ser: 6.56 mg/dL — ABNORMAL HIGH (ref 0.44–1.00)
GFR, Estimated: 6 mL/min — ABNORMAL LOW (ref >60)
Glucose, Bld: 91 mg/dL (ref 70–99)
Phosphorus: 5.2 mg/dL — ABNORMAL HIGH (ref 2.5–4.6)
Potassium: 4 mmol/L (ref 3.5–5.1)
Sodium: 136 mmol/L (ref 135–145)

## 2022-12-03 MED ORDER — MUPIROCIN 2 % EX OINT: 1.0000 | TOPICAL_OINTMENT | Freq: Two times a day (BID) | CUTANEOUS | 0 refills | Status: AC

## 2022-12-03 MED ORDER — METOPROLOL SUCCINATE ER 25 MG PO TB24: 50.0000 mg | ORAL_TABLET | Freq: Every day | ORAL | 2 refills | Status: DC

## 2022-12-03 MED ORDER — SULFAMETHOXAZOLE-TRIMETHOPRIM 400-80 MG PO TABS: 1.0000 | ORAL_TABLET | Freq: Every day | ORAL | 1 refills | Status: DC

## 2022-12-03 MED ORDER — DARBEPOETIN ALFA 100 MCG/0.5ML IJ SOSY
100.0000 ug | PREFILLED_SYRINGE | INTRAMUSCULAR | Status: DC
  Administered 2022-12-03: 100 ug via SUBCUTANEOUS
  Filled 2022-12-03: qty 0.5

## 2022-12-03 MED ORDER — CHLORHEXIDINE GLUCONATE CLOTH 2 % EX PADS
6.0000 | MEDICATED_PAD | Freq: Every day | CUTANEOUS | Status: DC
  Administered 2022-12-04 – 2022-12-08 (×5): 6 via TOPICAL

## 2022-12-03 NOTE — Progress Notes (Addendum)
Received patient before the start of the treatment,Alert to self ,confused,not following commands.Vitals stable.Md notified about patient non compliants on commands/instructions.Order done and carried out.  Access used : Left upper arm fistula.Trill and bruitt so weak to hear and palpate.,otherwise  cannulated successfully and works well during treatment.  Duration of treatment : 2 hours and 50 minutes,instead of three hours.See treatment issue.  Medicine given:  Fluid removed: 1,800 cc instead of 2,000 cc  Hemodialysis treatment issue. Fistula hard to cannulate.Patient wise ,she non compliant for instructions thus soft bilateral wrist restraint applied with order to have her HD treatment completed.       About 15 minutes before her treatment started,patient become restless and agitated,yelling out " I want to home". Tried to distract patient but her heart rate was increasing in beats  150/minutes  Treatment stopped 10 minutes before its completion.Bilateral soft restraint was discontinued around this time.Order was placed by patient's PMD.  Hands off to the patient's nurse especially patient's heart rate and the needs for restraint during treatment.

## 2022-12-03 NOTE — Progress Notes (Signed)
Explained to patient the needs to have her both wrist restraint while on treatment as ordered by Renal Md.Patient agreed .

## 2022-12-03 NOTE — Progress Notes (Signed)
[facility] Progress Note  Subjective: pt seen in HD unit. Pt requiring restraints due to agitation and not cooperating w/ HD nurses.   Vitals:   12/03/22 1100 12/03/22 1130 12/03/22 1200 12/03/22 1215  BP: 116/80 112/75 97/63 (!) 97/58  Pulse: 84 89 (!) 102 98  Resp: 16 17 (!) 27 (!) 21  Temp:      TempSrc:      SpO2: 96% 98% 99% 99%  Weight:      Height:        Exam: Gen elderly, deconditioned No jvd or bruits Chest clear bilat to bases RRR no MRG Abd soft ntnd no mass or ascites +bs Ext no LE edema Neuro is alert, mostly oriented , nf    LUA AVF+bruit      Home meds include - eliquis, hydralazine 100 tid, protonix, sevelamer 2 ac tid, prns/ vits/ supps        OP HD: [place] TTS  4h  400/600   53.6kg  2/2 bath  Heparin none LUA AVF - last HD 12/23 , post wt 53.7kg - venofer 50 q wk - hectorol 2 mcg IV tiw - mircera 100 mcg q2, last 11/18 - last Hb 10.4 on 12/19     MRI - IMPRESSION:  Unchanged findings of discitis-osteomyelitis at L2-L3 and L4-L5 with worsening paravertebral inflammatory changes. No paravertebral or epidural abscess.   CXR - no active disease   Assessment/ Plan: Back pain / fever - cx's pending, temp was 101 in ED. Hx septic discitis multiple rounds in 2023. MRI here showed unchanged osteo at L2-L3, L4-5, possibly slight worsening. Started on IV vanc/ cefepime. Blood cx's neg, and is now afebrile. ESR and CRP high. ID consulting.    ESRD - on HD TTS. Missed Tuesday HD, had HD here Wed night. HD today off schedule, rolled over from yesterday.  HTN/ vol - BP's good, no vol excess on exam. Up 2kg by wts after 2.8 L UF on 12/27. UFG 2 L w/ HD today.  Anemia esrd - Hb 10s on admit >> 8s now. Last OP esa was given 11/18. Will resume esa w/ darbe 100 ug weekly on Friday.  MBD ckd - CCa is a bit high at 10.4, holding IV vdra for now. Phos in range, cont renvela as binder.   Hep C - per pmd Atrial fib - on eliquis, holding BB for low HR.       Diamond [name] 12/03/2022, 12:20 PM   Recent Labs  Lab 12/02/22 0540 12/03/22 0147  HGB 8.7*  8.5* 8.8*  ALBUMIN 2.3* 2.4*  CALCIUM 9.2 10.0  PHOS 3.9 5.2*  CREATININE 5.33* 6.56*  K 3.9 4.0    No results for input(s): "IRON", "TIBC", "FERRITIN" in the last 168 hours. Inpatient medications:  apixaban  2.5 mg Oral BID   Chlorhexidine Gluconate Cloth  6 each Topical Q0600   hydrALAZINE  100 mg Oral Q8H   mupirocin ointment  1 Application Nasal BID   pantoprazole  40 mg Oral Daily   sevelamer carbonate  1,600 mg Oral TID WC   sulfamethoxazole-trimethoprim  1 tablet Oral QHS     acetaminophen, HYDROmorphone

## 2022-12-03 NOTE — Progress Notes (Addendum)
Patient cannot be discharged to [facility] today due to having to be in restraints at some point during HD session today.  CSW informed patient's son Diamond [name] of information.  [name], MSW, LCSW Transitions of Care  Clinical Social Worker II [phone]

## 2022-12-03 NOTE — Discharge Summary (Cosign Needed)
Name: Diamond [name] MRN: [id number] DOB: 02-Jun-1951 71 y.o. PCP: [facility]  Date of Admission: 11/29/2022  8:04 PM Date of Discharge: 12/03/2022 Attending Physician: Diamond Mussel, MD  Discharge Diagnosis: 1. Principal Problem:   Altered mental status Active Problems:   Discitis of lumbar region   ESRD (end stage renal disease) (HCC)   AMS (altered mental status)   Fever in adult   Discharge Medications: Allergies as of 12/03/2022       Reactions   Shrimp (diagnostic) Other (See Comments)   Unknown reaction   Other Swelling, Rash   Anesthesia - facial swelling, rash        Medication List     STOP taking these medications    losartan 25 MG tablet Commonly known as: COZAAR   metoprolol tartrate 50 MG tablet Commonly known as: LOPRESSOR   vancomycin  IVPB       TAKE these medications    acetaminophen 325 MG tablet Commonly known as: TYLENOL Take 2 tablets (650 mg total) by mouth 4 (four) times daily. What changed:  when to take this reasons to take this   albuterol 108 (90 Base) MCG/ACT inhaler Commonly known as: VENTOLIN HFA Inhale 2 puffs into the lungs every 4 (four) hours as needed for wheezing or shortness of breath.   apixaban 2.5 MG Tabs tablet Commonly known as: ELIQUIS Take 1 tablet (2.5 mg total) by mouth 2 (two) times daily.   cloNIDine 0.1 MG tablet Commonly known as: CATAPRES Take 0.1 mg by mouth every 6 (six) hours as needed (SBP > 170).   fluticasone-salmeterol 250-50 MCG/ACT Aepb Commonly known as: Advair Diskus Inhale 1 puff into the lungs in the morning and at bedtime.   gabapentin 100 MG capsule Commonly known as: NEURONTIN Take 1 capsule (100 mg total) by mouth at bedtime.   hydrALAZINE 100 MG tablet Commonly known as: APRESOLINE Take 1 tablet by mouth 3 times a day every Monday Wednesday Friday and Sunday Take 1 tablet by mouth 2 times a day every Tuesday Thursday and  Saturday What changed:  how much to take how to take this when to take this additional instructions   metoprolol succinate 25 MG 24 hr tablet Commonly known as: Toprol XL Take 2 tablets (50 mg total) by mouth daily.   MIRCERA IJ Mircera   mupirocin ointment 2 % Commonly known as: BACTROBAN Place 1 Application into the nose 2 (two) times daily for 2 days.   pantoprazole 40 MG tablet Commonly known as: PROTONIX Take 1 tablet (40 mg total) by mouth daily.   sevelamer 800 MG tablet Commonly known as: RENAGEL Take 2 tablets by mouth 3 times a day every Tuesday Thursday and Saturday Give 2 tablets by mouth with meals every Monday Wednesday Friday and Sunday What changed:  how much to take how to take this when to take this additional instructions   sevelamer carbonate 0.8 g Pack packet Commonly known as: RENVELA Take 0.8 g by mouth with breakfast, with lunch, and with evening meal.   sulfamethoxazole-trimethoprim 400-80 MG tablet Commonly known as: BACTRIM Take 1 tablet by mouth at bedtime.        Disposition and follow-up:   Ms.Diamond [name] was discharged from [facility] in Stable condition.  At the hospital follow up visit please address:  1.  Hx of osteomyelitis/discitis with new nonspecific paravertebral inflammatory changes on MRI: Patient will discharge with suppressive therapy with Bactrim. F/u  scheduled with Dr. Gale [name] 01/05/2023. Please f/u back pain, this felt to be radicular in nature.  ESRD w Anemia and MBD CKD: Continued HD will admitted. Tolerated well. Pt receives out-pt HD at [facility] on TTS. Hgb stable.  Chronic Hepatitis C: Treatment for chronic Hep C found in August should be discussed.  Atrial flutter Eliquis 2.5 mg BID. NSR this admission.  HTN Intermittently hypertensive during admission. Please ensure medical therapy is optimized. Will discharge on hydralazine 100 mg 3 times a day every Monday Wednesday Friday and Sunday  and 100 mg 2 times a day every Tuesday Thursday and Saturday as well as metoprolol succinate 50 mg daily. Patient also has clonidine for systolic PP>295.  GERD  Protonix 40 mg  2.  Labs / imaging needed at time of follow-up: CBC  3.  Pending labs/ test needing follow-up: none  Follow-up Appointments:  Contact information for after-discharge care     Destination     [facility] Preferred SNF .   Service: Skilled Nursing Contact information: [address] [place] [facility] Course by problem list: 1. AMS, resolved Fever of unknown source, resolved Patient presented with AMS, resolved in the ED, and fever on admission. Patient followed by ID outpatient for lumbar osteomyelitis. Last vanc before admission at HD 11/15/2022. Given history of recurrent bacteremia 2/2 lumbar spine osteomyelitis and worsening lumbar pain in the past 2-3 weeks, MRI lumbar spine was obtained. Initially suspicious for worsening osteomyelitis vs lumbar abscess. No evidence of worsening osteomyelitis at L2-L3, L4-5; however, worsening paravertebral inflammatory changes concerning for infection. No leukocytosis since admission. ESR and CRP elevated to 75 snf 16.2, respectively. U/A clean. Blood and urine cultures sent, no growth to this date. Patient was started on Vancomycin and cefepime on 12/25. Pain was managed with PO tylenol. ID was consulted on 12/27; recommended Bactrim suppression and ID follow up outpatient. Patient did complain of bilateral low back / flank pain that seemed to migrate throughout admis. UA and urine culture clean. Patient without weakness or loss of sensation in lower extremities. Denies urinary or bowel incontinence. This felt to be radicular in nature.  Discharge Exam:   BP (!) 97/58   Pulse 98   Temp 98.1 F (36.7 C) (Oral)   Resp (!) 21   Ht _0  (1.676 m)   Wt 55.8 kg   SpO2 99%   BMI 19.86  kg/m  Discharge exam:   Physical Exam Constitutional:      General: She is not in acute distress. HENT:     Head: Normocephalic and atraumatic.  Eyes:     Extraocular Movements: Extraocular movements intact.     Conjunctiva/sclera: Conjunctivae normal.  Cardiovascular:     Rate and Rhythm: Normal rate and regular rhythm.     Heart sounds: No murmur heard. Pulmonary:     Effort: Pulmonary effort is normal. No respiratory distress.  Skin:    General: Skin is warm and dry.  Neurological:     Mental Status: She is alert and oriented to person, place, and time.  Psychiatric:        Mood and Affect: Mood normal.        Behavior: Behavior normal.      Pertinent Labs, Studies, and Procedures:     Latest Ref Rng & Units 12/03/2022    1:47  AM 12/02/2022    5:40 AM 12/01/2022    3:41 AM  CBC  WBC 4.0 - 10.5 K/uL 4.6  3.6    3.6  4.3   Hemoglobin 12.0 - 15.0 g/dL 8.8  8.5    8.7  8.8   Hematocrit 36.0 - 46.0 % 25.6  25.9    25.8  25.6   Platelets 150 - 400 K/uL 152  138    141  127       Latest Ref Rng & Units 12/03/2022    1:47 AM 12/02/2022    5:40 AM 11/29/2022    9:10 PM  CMP  Glucose 70 - 99 mg/dL 91  98  95   BUN 8 - 23 mg/dL 38  28  36   Creatinine 0.44 - 1.00 mg/dL 6.56  5.33  7.70   Sodium 135 - 145 mmol/L 136  139  137   Potassium 3.5 - 5.1 mmol/L 4.0  3.9  3.5   Chloride 98 - 111 mmol/L 95  100  99   CO2 22 - 32 mmol/L 26  28    Calcium 8.9 - 10.3 mg/dL 10.0  9.2     MR LUMBAR SPINE WO CONTRAST  Result Date: 11/30/2022 CLINICAL DATA:  Worsening low back pain over the past 2 weeks. History of osteomyelitis discitis. EXAM: MRI LUMBAR SPINE WITHOUT CONTRAST TECHNIQUE: Multiplanar, multisequence MR imaging of the lumbar spine was performed. No intravenous contrast was administered. COMPARISON:  MRI lumbar spine dated September 17, 2022. FINDINGS: Segmentation: Unchanged transitional lumbosacral anatomy with sacralization of L5. Alignment:  Unchanged trace  anterolisthesis at L1-L2 and L3-L4. Vertebrae: Similar degree of endplate irregularity, marrow edema, and decreased T1 signal at L2-L3 and L4-L5. Diffusely decreased L3-T1 marrow signal is also unchanged. Slightly decreased amount of intradiscal fluid at L2-L3 and L4-L5. Chronic L3 compression deformities unchanged. No acute fracture or suspicious bone lesion. Conus medullaris and cauda equina: Conus extends to the L1 level. Similar degree of anterior epidural phlegmon at L3. Paraspinal and other soft tissues: Worsened paravertebral inflammatory changes from L2-L4, most notable anteriorly where there is increased soft tissue thickening between the vertebral bodies and aorta/IVC. No drainable fluid collection. Multiple bilateral renal simple cysts again noted. No follow-up imaging is recommended. Disc levels: T12-L1:  Negative. L1-L2: Unchanged mild disc bulging with superimposed small central disc protrusion. Unchanged mild bilateral facet arthropathy. Unchanged mild bilateral lateral recess stenosis. No spinal canal or neuroforaminal stenosis. L2-L3: Unchanged disc bulging and bilateral ligamentum flavum hypertrophy resulting in mild-to-moderate spinal canal and bilateral lateral recess stenosis. Unchanged mild bilateral neuroforaminal stenosis. L3-L4: Unchanged moderate disc bulging, bilateral facet arthropathy, and anterior epidural phlegmon resulting in moderate to severe spinal canal stenosis with mild right and moderate left neuroforaminal stenosis. L4-L5: Unchanged circumferential disc osteophyte complex with superimposed central disc protrusion. Unchanged moderate bilateral facet arthropathy. Unchanged moderate spinal canal and moderate to severe bilateral lateral recess stenosis. Unchanged mild-to-moderate bilateral neuroforaminal stenosis. L5-S1:  Transitional level.  Negative. IMPRESSION: 1. Unchanged findings of discitis-osteomyelitis at L2-L3 and L4-L5 with worsening paravertebral inflammatory changes.  No paravertebral or epidural abscess. 2. Unchanged mild-to-moderate L2-L3, moderate to severe L3-L4, and moderate L4-L5 spinal canal stenosis. Electronically Signed   By: Titus Dubin M.D.   On: 11/30/2022 09:42   DG Chest 1 View  Result Date: 11/30/2022 CLINICAL DATA:  Fever.  Evaluate for infection EXAM: CHEST  1 VIEW COMPARISON:  10/04/2022 FINDINGS: Similar cardiomegaly given differences in technique and patient rotation. No focal  consolidation, pleural effusion, or pneumothorax. No acute osseous abnormality. Irregular density projecting over the left mid lung corresponds to dense breast calcification seen on prior imaging. No displaced rib fractures. IMPRESSION: No active disease.  Cardiomegaly. Electronically Signed   By: Placido Sou M.D.   On: 11/30/2022 03:00   CT Head Wo Contrast  Result Date: 11/30/2022 CLINICAL DATA:  Altered mental status EXAM: CT HEAD WITHOUT CONTRAST TECHNIQUE: Contiguous axial images were obtained from the base of the skull through the vertex without intravenous contrast. RADIATION DOSE REDUCTION: This exam was performed according to the departmental dose-optimization program which includes automated exposure control, adjustment of the mA and/or kV according to patient size and/or use of iterative reconstruction technique. COMPARISON:  10/05/2022 FINDINGS: Brain: Normal anatomic configuration. Parenchymal volume loss is commensurate with the patient's age. Stable extensive periventricular Naoma Boxell matter changes are present likely reflecting the sequela of small vessel ischemia. Remote lacunar infarcts within the inferior right basal ganglia and left insular cortex. No abnormal intra or extra-axial mass lesion or fluid collection. No abnormal mass effect or midline shift. No evidence of acute intracranial hemorrhage or infarct. Ventricular size is normal. Cerebellum unremarkable. Vascular: No asymmetric hyperdense vasculature at the skull base. Skull: Intact  Sinuses/Orbits: Paranasal sinuses are clear. Orbits are unremarkable. Other: Mastoid air cells and middle ear cavities are clear. IMPRESSION: 1. No acute intracranial hemorrhage or infarct. 2. Stable extensive periventricular Sabella Traore matter changes likely reflecting the sequela of small vessel ischemia. 3. Remote lacunar infarcts within the right basal ganglia and left insular cortex. Electronically Signed   By: Fidela Salisbury M.D.   On: 11/30/2022 00:24   CT CERVICAL SPINE WO CONTRAST  Result Date: 10/05/2022 CLINICAL DATA:  Polytrauma, blunt EXAM: CT CERVICAL SPINE WITHOUT CONTRAST TECHNIQUE: Multidetector CT imaging of the cervical spine was performed without intravenous contrast. Multiplanar CT image reconstructions were also generated. RADIATION DOSE REDUCTION: This exam was performed according to the departmental dose-optimization program which includes automated exposure control, adjustment of the mA and/or kV according to patient size and/or use of iterative reconstruction technique. COMPARISON:  None Available. FINDINGS: Alignment: Normal Skull base and vertebrae: No acute fracture. No primary bone lesion or focal pathologic process. Soft tissues and spinal canal: No prevertebral fluid or swelling. No visible canal hematoma. Disc levels: Severe degenerative disc disease at C5-6, similar to prior study. Large posterior disc osteophyte complexes at C3-4 and C4-5, stable. Upper chest: No acute findings Other: Calcified nodule in the left thyroid lobe, stable. This has been evaluated on previous imaging. (ref: J Am Coll Radiol. 2015 Feb;12(2): 143-50). IMPRESSION: No acute bony abnormality or change since recent study. Electronically Signed   By: Rolm Baptise M.D.   On: 10/05/2022 03:18   CT HEAD WO CONTRAST  Result Date: 10/05/2022 CLINICAL DATA:  Head trauma, moderate-severe EXAM: CT HEAD WITHOUT CONTRAST TECHNIQUE: Contiguous axial images were obtained from the base of the skull through the vertex  without intravenous contrast. RADIATION DOSE REDUCTION: This exam was performed according to the departmental dose-optimization program which includes automated exposure control, adjustment of the mA and/or kV according to patient size and/or use of iterative reconstruction technique. COMPARISON:  09/16/2022 FINDINGS: Brain: There is atrophy and chronic small vessel disease changes. No acute intracranial abnormality. Specifically, no hemorrhage, hydrocephalus, mass lesion, acute infarction, or significant intracranial injury. Vascular: No hyperdense vessel or unexpected calcification. Skull: No acute calvarial abnormality. Sinuses/Orbits: No acute findings Other: None IMPRESSION: Atrophy, chronic microvascular disease. No acute intracranial abnormality. Electronically Signed  By: Rolm Baptise M.D.   On: 10/05/2022 03:16   DG Chest Port 1 View  Result Date: 10/04/2022 CLINICAL DATA:  Trauma, fall. EXAM: PORTABLE CHEST 1 VIEW COMPARISON:  09/16/2022 FINDINGS: Stable heart size and mediastinal contours. Patient is rotated but less so than on prior exam. No pneumothorax, large pleural effusion or focal airspace disease. On limited assessment, no acute osseous findings. Coarse left breast calcification. IMPRESSION: No acute findings or evidence of traumatic injury. Electronically Signed   By: Keith Rake M.D.   On: 10/04/2022 23:52   ECHOCARDIOGRAM COMPLETE  Result Date: 09/22/2022    ECHOCARDIOGRAM REPORT   Patient Name:   LILO WALLINGTON Date of Exam: 09/22/2022 Medical Rec #:  [id number]      Height:       66.0 in Accession #:    [id number]     Weight:       115.5 lb Date of Birth:  1951/05/14     BSA:          1.584 m Patient Age:    76 years       BP:           120/81 mmHg Patient Gender: F              HR:           67 bpm. Exam Location:  Inpatient Procedure: 2D Echo, Color Doppler and Cardiac Doppler Indications:    Bacteremia  History:        Patient has prior history of Echocardiogram  examinations, most                 recent 08/16/2022. Arrythmias:Atrial Flutter; Risk                 Factors:Hypertension.  Sonographer:    Ronny Flurry Referring Phys: [id number] [place]  1. Left ventricular ejection fraction, by estimation, is 60 to 65%. The left ventricle has normal function. The left ventricle has no regional wall motion abnormalities. There is mild concentric left ventricular hypertrophy. Left ventricular diastolic function could not be evaluated.  2. Right ventricular systolic function is normal. The right ventricular size is normal. There is normal pulmonary artery systolic pressure.  3. Left atrial size was moderately dilated.  4. Previously noted mobile mass in the RA not visible on this study. Right atrial size was mildly dilated.  5. The mitral valve is abnormal. Mild mitral valve regurgitation. No evidence of mitral stenosis.  6. Tricuspid valve regurgitation is mild to moderate.  7. The aortic valve is tricuspid. There is mild calcification of the aortic valve. Aortic valve regurgitation is trivial. Aortic valve sclerosis/calcification is present, without any evidence of aortic stenosis. Aortic valve area, by VTI measures 2.96 cm. Aortic valve mean gradient measures 3.0 mmHg. Aortic valve Vmax measures 1.24 m/s.  8. Aortic dilatation noted. There is borderline dilatation of the ascending aorta, measuring 38 mm.  9. The inferior vena cava is dilated in size with <50% respiratory variability, suggesting right atrial pressure of 15 mmHg. FINDINGS  Left Ventricle: Left ventricular ejection fraction, by estimation, is 60 to 65%. The left ventricle has normal function. The left ventricle has no regional wall motion abnormalities. The left ventricular internal cavity size was normal in size. There is  mild concentric left ventricular hypertrophy. Left ventricular diastolic function could not be evaluated due to atrial fibrillation. Left ventricular diastolic function  could not be evaluated. Right Ventricle: The right ventricular size is  normal. No increase in right ventricular wall thickness. Right ventricular systolic function is normal. There is normal pulmonary artery systolic pressure. The tricuspid regurgitant velocity is 2.27 m/s, and  with an assumed right atrial pressure of 15 mmHg, the estimated right ventricular systolic pressure is 78.2 mmHg. Left Atrium: Left atrial size was moderately dilated. Right Atrium: Previously noted mobile mass in the RA not visible on this study. Right atrial size was mildly dilated. Pericardium: There is no evidence of pericardial effusion. Mitral Valve: The mitral valve is abnormal. There is mild thickening of the mitral valve leaflet(s). Mild mitral valve regurgitation. No evidence of mitral valve stenosis. Tricuspid Valve: The tricuspid valve is normal in structure. Tricuspid valve regurgitation is mild to moderate. No evidence of tricuspid stenosis. Aortic Valve: The aortic valve is tricuspid. There is mild calcification of the aortic valve. Aortic valve regurgitation is trivial. Aortic valve sclerosis/calcification is present, without any evidence of aortic stenosis. Aortic valve mean gradient measures 3.0 mmHg. Aortic valve peak gradient measures 6.2 mmHg. Aortic valve area, by VTI measures 2.96 cm. Pulmonic Valve: The pulmonic valve was normal in structure. Pulmonic valve regurgitation is not visualized. No evidence of pulmonic stenosis. Aorta: Aortic dilatation noted. There is borderline dilatation of the ascending aorta, measuring 38 mm. Venous: The inferior vena cava is dilated in size with less than 50% respiratory variability, suggesting right atrial pressure of 15 mmHg. IAS/Shunts: No atrial level shunt detected by color flow Doppler.  LEFT VENTRICLE PLAX 2D LVIDd:         4.10 cm   Diastology LVIDs:         2.60 cm   LV e' medial:    7.72 cm/s LV PW:         1.30 cm   LV E/e' medial:  17.1 LV IVS:        1.20 cm   LV e'  lateral:   8.59 cm/s LVOT diam:     2.20 cm   LV E/e' lateral: 15.4 LV SV:         78 LV SV Index:   49 LVOT Area:     3.80 cm  RIGHT VENTRICLE             IVC RV S prime:     15.10 cm/s  IVC diam: 2.80 cm TAPSE (M-mode): 1.7 cm LEFT ATRIUM             Index        RIGHT ATRIUM           Index LA diam:        3.90 cm 2.46 cm/m   RA Area:     13.00 cm LA Vol (A2C):   71.8 ml 45.30 ml/m  RA Volume:   27.70 ml  17.49 ml/m LA Vol (A4C):   77.2 ml 48.75 ml/m LA Biplane Vol: 73.0 ml 46.09 ml/m  AORTIC VALVE AV Area (Vmax):    3.57 cm AV Area (Vmean):   3.50 cm AV Area (VTI):     2.96 cm AV Vmax:           124.00 cm/s AV Vmean:          81.800 cm/s AV VTI:            0.263 m AV Peak Grad:      6.2 mmHg AV Mean Grad:      3.0 mmHg LVOT Vmax:         116.33 cm/s LVOT Vmean:  75.400 cm/s LVOT VTI:          0.205 m LVOT/AV VTI ratio: 0.78  AORTA Ao Root diam: 3.40 cm Ao Asc diam:  3.75 cm MV E velocity: 132.00 cm/s  TRICUSPID VALVE                             TR Peak grad:   20.6 mmHg                             TR Vmax:        227.00 cm/s                              SHUNTS                             Systemic VTI:  0.20 m                             Systemic Diam: 2.20 cm Glori Bickers MD Electronically signed by Glori Bickers MD Signature Date/Time: 09/22/2022/4:24:56 PM    Final    MR LUMBAR SPINE WO CONTRAST  Result Date: 09/17/2022 CLINICAL DATA:  Epidural abscess suspect EXAM: MRI LUMBAR SPINE WITHOUT CONTRAST TECHNIQUE: Multiplanar, multisequence MR imaging of the lumbar spine was performed. No intravenous contrast was administered. COMPARISON:  MRI L spine 07/26/22, CT L Spine 09/16/22 FINDINGS: Segmentation: In keeping with prior numbering convention the last well-formed disc space is labeled L5-S1. Alignment:  There is mild anterolisthesis of L3 on L4. Vertebrae: Redemonstrated are multiple compression deformities in the lumbar spine, including at the L2, L3, L4, and L5 levels. These  compression deformities appear grossly unchanged compared to prior lumbar spine MRI dated 07/26/2022. On CT of the L-spine obtained on 09/16/2022 there is irregular endplate erosion, best seen at the L4-L5 level. Surrounding these compression deformities is extensive T2 hyperintense signal abnormality in the paraspinal soft tissues with extension into the bilateral psoas musculature (series 7, images 24-32). Abnormal T2 signal is also seen along the ventral margin (series 7, image 20) of the vertebral bodies. Compared to prior exam from August, the degree of soft tissue abnormality has increased, best seen at the L2-L3 disc space level. Compared to prior exam the amount of T2 heterogeneous soft tissue within the ventral epidural space at this level has also increased (series 7,image 25) now resulting in moderate to severe spinal canal stenosis, previously mild-to-moderate. There is also increased abnormal soft tissue in the ventral epidural space along the posterior margin of the L3 vertebral body (series 2, image 8) with persistent moderate to severe spinal canal narrowing at the L3-L4 disc space level. There is persistent severe spinal canal narrowing at the L5-S1 level secondary to a central disc protrusion. Conus medullaris and cauda equina: Conus extends to the L1 level. Conus and cauda equina appear normal. IMPRESSION: 1. Findings are concerning for progression of discitis/osteomyelitis with increased soft tissue extent of infection in the paraspinal and epidural spaces. Compared to prior exam from 07/26/2022, there is increased abnormal soft tissue in the ventral epidural space at the L2-L3 and L3-L4 disc space levels resulting in moderate to severe spinal canal narrowing at these levels. There is also increased signal abnormality in the surrounding paraspinal soft tissues  and bilateral psoas musculature. 2. Persistent severe spinal canal narrowing at the L5-S1 level secondary to a central disc protrusion. 3.  Stable compression deformities of the L2, L3, L4, and L5 vertebral bodies with irregular endplate erosion at the L4-L5 level, best seen on CT of the L-spine obtained on 09/16/2022. Electronically Signed   By: Marin Roberts M.D.   On: 09/17/2022 16:26   US THYROID  Result Date: 09/17/2022 CLINICAL DATA:  Nodule EXAM: THYROID ULTRASOUND TECHNIQUE: Ultrasound examination of the thyroid gland and adjacent soft tissues was performed. COMPARISON:  None Available. FINDINGS: Parenchymal Echotexture: Moderately heterogenous Isthmus: 0.5 cm thickness Right lobe: 3.6 x 1.6 x 1.3 cm Left lobe: 3.5 x 2 x 2 cm _________________________________________________________ Estimated total number of nodules >/= 1 cm: 2 Number of spongiform nodules >/=  2 cm not described below (TR1): 0 Number of mixed cystic and solid nodules >/= 1.5 cm not described below (TR2): 0 _________________________________________________________ Nodule # 1: Location: Right; mid Maximum size: 1.2 cm; Other 2 dimensions: 0.8 x 1.2 cm Composition: solid/almost completely solid (2) Echogenicity: hypoechoic (2) Shape: not taller-than-wide (0) Margins: smooth (0) Echogenic foci: none (0) ACR TI-RADS total points: 4. ACR TI-RADS risk category: TR 4. ACR TI-RADS recommendations: *Given size (>/= 1 - 1.4 cm) and appearance, a follow-up ultrasound in 1 year should be considered based on TI-RADS criteria. _________________________________________________________ Nodule # 2: Location: Left; mid Maximum size: 1.4 cm; Other 2 dimensions: 1.1 x 1.2 cm Composition: solid/almost completely solid (2) Echogenicity: hypoechoic (2) Shape: not taller-than-wide (0) Margins: smooth (0) Echogenic foci: peripheral calcifications (2) ACR TI-RADS total points: 6. ACR TI-RADS risk category: TR 4. ACR TI-RADS recommendations: *Given size (>/= 1 - 1.4 cm) and appearance, a follow-up ultrasound in 1 year should be considered based on TI-RADS criteria.  _________________________________________________________ No regional cervical adenopathy. IMPRESSION: 1. Normal-sized thyroid with bilateral nodules. Neither meets criteria for biopsy. Recommend annual/biennial ultrasound follow-up as above, until stability x5 years confirmed. The above is in keeping with the ACR TI-RADS recommendations - J Am Coll Radiol 2017;14:587-595. Electronically Signed   By: Lucrezia Europe M.D.   On: 09/17/2022 12:07   CT Lumbar Spine Wo Contrast  Result Date: 09/17/2022 CLINICAL DATA:  Lumbar compression fracture EXAM: CT LUMBAR SPINE WITHOUT CONTRAST TECHNIQUE: Multidetector CT imaging of the lumbar spine was performed without intravenous contrast administration. Multiplanar CT image reconstructions were also generated. RADIATION DOSE REDUCTION: This exam was performed according to the departmental dose-optimization program which includes automated exposure control, adjustment of the mA and/or kV according to patient size and/or use of iterative reconstruction technique. COMPARISON:  07/25/2022 FINDINGS: Segmentation: Transitional L5 vertebrae with bilateral assimilation joints. Alignment: Grade 1 anterolisthesis at L3-4 Vertebrae: Diffuse osteopenia. There is no acute compression fracture. There is redemonstration of a nonacute L3 compression fracture with an osteonecrotic cleft beneath the superior endplate which has expanded slightly but is otherwise unchanged. Advanced degenerative disc disease with endplate remodeling at Y1-0 is unchanged. Paraspinal and other soft tissues: Calcific aortic atherosclerosis Disc levels: L1-2: Mild disc bulge.  Without high-grade stenosis L2-3: Unchanged retropulsion of posterosuperior corner with moderate spinal canal stenosis. L3-4: Unchanged severe spinal canal stenosis due to combination of ligamentum flavum bulk and large disc bulge. L4-5: Moderate disc bulge with endplate spurring. Unchanged mild spinal canal stenosis. L5-S1: Transitional level  IMPRESSION: 1. No acute compression fracture. 2. Unchanged nonacute L3 compression fracture with an osteonecrotic cleft beneath the superior endplate 3. Unchanged severe spinal canal stenosis at L3-4 and moderate  spinal canal stenosis at L2-3. Aortic Atherosclerosis (ICD10-I70.0). Electronically Signed   By: Ulyses Jarred M.D.   On: 09/17/2022 00:04   CT Cervical Spine Wo Contrast  Result Date: 09/16/2022 CLINICAL DATA:  Polytrauma, blunt Level 2 fall EXAM: CT HEAD WITHOUT CONTRAST CT CERVICAL SPINE WITHOUT CONTRAST TECHNIQUE: Multidetector CT imaging of the head and cervical spine was performed following the standard protocol without intravenous contrast. Multiplanar CT image reconstructions of the cervical spine were also generated. RADIATION DOSE REDUCTION: This exam was performed according to the departmental dose-optimization program which includes automated exposure control, adjustment of the mA and/or kV according to patient size and/or use of iterative reconstruction technique. COMPARISON:  MR thoracic spine 07/26/2022 FINDINGS: CT HEAD FINDINGS BRAIN: BRAIN Cerebral ventricle sizes are concordant with the degree of cerebral volume loss. Patchy and confluent areas of decreased attenuation are noted throughout the deep and periventricular Arica B[place]qua matter of the cerebral hemispheres bilaterally, compatible with chronic microvascular ischemic disease. No evidence of large-territorial acute infarction. No parenchymal hemorrhage. No mass lesion. No extra-axial collection. No mass effect or midline shift. No hydrocephalus. Basilar cisterns are patent. Empty sella. Vascular: No hyperdense vessel. Atherosclerotic calcifications are present within the cavernous internal carotid arteries. Skull: No acute fracture or focal lesion. Sinuses/Orbits: Paranasal sinuses and mastoid air cells are clear. The orbits are unremarkable. Other: None. CT CERVICAL SPINE FINDINGS Alignment: Similar-appearing retrolisthesis of C5 on  C6. Skull base and vertebrae: Similar-appearing severe degenerative changes of the C5-C6 level with anterior wedge deformity of the C5 level. Posterior disc osteophyte complex formation at the C3-C4 and C4-C5 level. No severe osseous neural foraminal or central canal stenosis. No acute fracture. No aggressive appearing focal osseous lesion or focal pathologic process. Soft tissues and spinal canal: No prevertebral fluid or swelling. No visible canal hematoma. Upper chest: Unremarkable. Other: Severe atherosclerotic plaque of the visualized thoracic aorta. A 1.5 cm calcified hypodensity within the left thyroid gland. IMPRESSION: 1.  No acute intracranial abnormality. 2. No acute displaced fracture or traumatic listhesis of the cervical spine in a patient with chronic severe C5-C6 degenerative changes. 3. Empty sella. Findings is often a normal anatomic variant but can be associated with idiopathic intracranial hypertension (pseudotumor cerebri). 4. A 1.5 cm calcified hypodensity within the left thyroid gland. Recommend thyroid US (ref: J Am Coll Radiol. 2015 Feb;12(2): 143-50). Electronically Signed   By: Iven Finn M.D.   On: 09/16/2022 23:17   CT HEAD WO CONTRAST (5MM)  Result Date: 09/16/2022 CLINICAL DATA:  Polytrauma, blunt Level 2 fall EXAM: CT HEAD WITHOUT CONTRAST CT CERVICAL SPINE WITHOUT CONTRAST TECHNIQUE: Multidetector CT imaging of the head and cervical spine was performed following the standard protocol without intravenous contrast. Multiplanar CT image reconstructions of the cervical spine were also generated. RADIATION DOSE REDUCTION: This exam was performed according to the departmental dose-optimization program which includes automated exposure control, adjustment of the mA and/or kV according to patient size and/or use of iterative reconstruction technique. COMPARISON:  MR thoracic spine 07/26/2022 FINDINGS: CT HEAD FINDINGS BRAIN: BRAIN Cerebral ventricle sizes are concordant with the  degree of cerebral volume loss. Patchy and confluent areas of decreased attenuation are noted throughout the deep and periventricular Itzae Mccurdy matter of the cerebral hemispheres bilaterally, compatible with chronic microvascular ischemic disease. No evidence of large-territorial acute infarction. No parenchymal hemorrhage. No mass lesion. No extra-axial collection. No mass effect or midline shift. No hydrocephalus. Basilar cisterns are patent. Empty sella. Vascular: No hyperdense vessel. Atherosclerotic calcifications are present within the  cavernous internal carotid arteries. Skull: No acute fracture or focal lesion. Sinuses/Orbits: Paranasal sinuses and mastoid air cells are clear. The orbits are unremarkable. Other: None. CT CERVICAL SPINE FINDINGS Alignment: Similar-appearing retrolisthesis of C5 on C6. Skull base and vertebrae: Similar-appearing severe degenerative changes of the C5-C6 level with anterior wedge deformity of the C5 level. Posterior disc osteophyte complex formation at the C3-C4 and C4-C5 level. No severe osseous neural foraminal or central canal stenosis. No acute fracture. No aggressive appearing focal osseous lesion or focal pathologic process. Soft tissues and spinal canal: No prevertebral fluid or swelling. No visible canal hematoma. Upper chest: Unremarkable. Other: Severe atherosclerotic plaque of the visualized thoracic aorta. A 1.5 cm calcified hypodensity within the left thyroid gland. IMPRESSION: 1.  No acute intracranial abnormality. 2. No acute displaced fracture or traumatic listhesis of the cervical spine in a patient with chronic severe C5-C6 degenerative changes. 3. Empty sella. Findings is often a normal anatomic variant but can be associated with idiopathic intracranial hypertension (pseudotumor cerebri). 4. A 1.5 cm calcified hypodensity within the left thyroid gland. Recommend thyroid US (ref: J Am Coll Radiol. 2015 Feb;12(2): 143-50). Electronically Signed   By: Iven Finn  M.D.   On: 09/16/2022 23:17   DG Pelvis Portable  Result Date: 09/16/2022 CLINICAL DATA:  Level 2 trauma, fall. History of fracture 2 lower back from years ago. EXAM: PORTABLE PELVIS 1-2 VIEWS COMPARISON:  07/07/2022. FINDINGS: Examination is limited due to patient rotation. The sacrum is not well evaluated on exam due to overlying bowel gas. No obvious acute fracture or dislocation. There is stable bony deformity of the inferior pubic ramus on the left, which may be due to old trauma. IMPRESSION: No acute fracture or dislocation. Electronically Signed   By: Brett Fairy M.D.   On: 09/16/2022 22:52   DG Chest Port 1 View  Result Date: 09/16/2022 CLINICAL DATA:  Level 2 trauma, fall. History of fracture and lower back for years ago. EXAM: PORTABLE CHEST 1 VIEW COMPARISON:  None Available. FINDINGS: The heart size and mediastinal contours are within normal limits. There is atherosclerotic calcification of the aorta. No consolidation, effusion, or pneumothorax. No acute osseous abnormality is seen. IMPRESSION: No active disease. Electronically Signed   By: Brett Fairy M.D.   On: 09/16/2022 22:46   DG Wrist Complete Right  Result Date: 09/16/2022 CLINICAL DATA:  Level 2 trauma, fall. EXAM: RIGHT WRIST - COMPLETE 3+ VIEW COMPARISON:  01/16/2021. FINDINGS: There is no evidence of fracture or dislocation. Mild degenerative changes are present at the first carpometacarpal joint and wrist. Vascular calcifications are noted in the soft tissues. There is chondrocalcinosis with calcification at the TFCC. IMPRESSION: No acute fracture or dislocation. Electronically Signed   By: Brett Fairy M.D.   On: 09/16/2022 22:45     Discharge Instructions: Discharge Instructions     Discharge instructions   Complete by: As directed    Mrs. Montesano,  It was a pleasure caring for you during your admission here at [facility]. You came in with altered mental status and a fever. It is likely that your fever is  related to an infection near your spine. Your fever and symptoms have resolved with antibiotics. Please note the following items: -follow up with infectious disease on 01/05/2023 -please schedule an appointment in the next week or so with your primary care provider -Take Bactrim 1 tablet daily until your follow-up with infectious disease -Stop taking losartan and Lopressor.  -Start taking metoprolol succinate (Toprol  XL) 50 mg daily   No wound care   Complete by: As directed        Signed: Linward Natal, MD 12/03/2022, 12:50 PM   Pager: [phone]

## 2022-12-03 NOTE — Progress Notes (Signed)
Hospital day#3 Subjective:   Summary: Diamond [name] is a 71 y.o. with a pertinent PMH of HTN, ESRD on dialysis TTS, atrial flutter, hx lumbar osteomylitis and MRSA bacteremia with infectious endocarditis admitted for AMS, now resolved,  and fever of unknown source with lumbar paravertebral inflammation on MRI, concerning for lumbar osteomyelitis   Overnight Events: None  Patient states she had a dark spot on her arm that started when she got to dialysis unit but this has resolved. She denies any fever, chills, diarrhea, burning with urination.   Objective:  Vital signs in last 24 hours: Vitals:   12/03/22 1200 12/03/22 1215 12/03/22 1230 12/03/22 1301  BP: 97/63 (!) 97/58 (!) 105/59 (!) 134/107  Pulse: (!) 102 98 (!) 42 69  Resp: (!) 27 (!) 21 (!) 21 (!) 22  Temp:      TempSrc:      SpO2: 99% 99% 100% 100%  Weight:      Height:       Supplemental O2: Room Air SpO2: 100 % Filed Weights   12/02/22 0500 12/03/22 0500 12/03/22 0930  Weight: 57.6 kg 56.2 kg 55.8 kg    Physical Exam:  Constitutional:chronically ill-appearing woman sitting in bed, in no acute distress HENT: normocephalic atraumatic, moist mucous membranes Neck: supple Cardiovascular: regular rate and rhythm, no m/r/g.  Pulmonary/Chest: normal work of breathing on room air, lungs CTAB Neurological: alert & oriented x 3 Skin: warm and dry Psych: Normal mood and affect  No intake or output data in the 24 hours ending 12/03/22 1307   Net IO Since Admission: -1,640 mL [12/03/22 1307]  Pertinent Labs:    Latest Ref Rng & Units 12/03/2022    1:47 AM 12/02/2022    5:40 AM 12/01/2022    3:41 AM  CBC  WBC 4.0 - 10.5 K/uL 4.6  3.6    3.6  4.3   Hemoglobin 12.0 - 15.0 g/dL 8.8  8.5    8.7  8.8   Hematocrit 36.0 - 46.0 % 25.6  25.9    25.8  25.6   Platelets 150 - 400 K/uL 152  138    141  127        Latest Ref Rng & Units 12/03/2022    1:47 AM 12/02/2022    5:40 AM 11/29/2022    9:10 PM  CMP   Glucose 70 - 99 mg/dL 91  98  95   BUN 8 - 23 mg/dL 38  28  36   Creatinine 0.44 - 1.00 mg/dL 6.56  5.33  7.70   Sodium 135 - 145 mmol/L 136  139  137   Potassium 3.5 - 5.1 mmol/L 4.0  3.9  3.5   Chloride 98 - 111 mmol/L 95  100  99   CO2 22 - 32 mmol/L 26  28    Calcium 8.9 - 10.3 mg/dL 10.0  9.2      Imaging: No results found.  Assessment/Plan:   Principal Problem:   Altered mental status Active Problems:   Discitis of lumbar region   ESRD (end stage renal disease) ([place])   AMS (altered mental status)   Fever in adult   Patient Summary: Diamond [name] is a 71 y.o. with a pertinent PMH of HTN, ESRD on dialysis TTS, atrial flutter, hx lumbar osteomylitis and MRSA bacteremia with infectious endocarditis who presented with AMS, now resolved, and fever of unknown origin, admitted for further infectious work up.    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 Last vanc before admission at HD 11/15/2022. Worsening lumbar pain over past 2 weeks, none on admission. New flank pain 12/27 - see below. Initially with high suspicion for worsening osteomyelitis vs lumbar abscess. Lumbar MRI with unchanged osteomyelitis at L2-L3, L4-5 with worsening paravertebral inflammatory changes. ESR 75 and CRP 16.2 on 12/27. Started on vancomycin and cefepime. Lactic acid initially elevated to >9.0, now wnl. Remains afebrile today without leukocytosis. No growth on cultures x3 days. ESR now 82, CRP 12. ID has been consulted for antibiotic management. Will start suppressive bactrim. -ID consulted, appreciate recommendations -suppressive bactrim 1 tablet daily until ID f/u -DC Vancomycin per pharmacy and cefepime (12/25-12/29) -F/u blood cultures x2 (12/26-, no growth x 3 days) -Pain management: Tylenol  New bilateral flank pain with  radiation C/f pyelonephritis Patient endorsing bilateral flank pain 12/27 with radiation to her lower abdomen and suprapubic area. Today this pain seems to localize to hips / lateral lumbar area. No nausea, vomiting, or chills. No red flag symptoms. Afebrile since admission. UA clean. Feel this pain is most likely radicular in etiology.   ESRD HD on TTS Underwent HD 12/29. Required restraints. Will have to demonstrate she can undergo dialysis without restraints before transitioning back to outpatient dialysis.  -Continue Sevelamer 1600 mg TID  Atrial flutter Eliquis 2.5 mg BID  Hx R atrial mass and MRSA bacteremia TEE 12/22/2021 with right atrial/IVC globular mobile mass consistent with thombous/vegetation. Resolved on repeat echocardiogram. Finished IV antibiotics for this on 02/13/2022. No cardiac symptoms today.   HTN On hydralazine 100 mg  GERD  Protonix 40 mg  Diet: Renal with fluids VTE: On eliquis Code: Full PT/OT recs: pending Family Update: attempted. Will try again   Dispo: Anticipated discharge to  home  pending clinical improvement and antibiotic plan. Possibly discharge today.  Linward Natal, MD Internal Medicine Resident PGY-1 Please contact the on call pager after 5 pm and on weekends at [phone].

## 2022-12-03 NOTE — Progress Notes (Signed)
Contacted by CSW with request to change pt's out-pt HD clinic to [facility] while pt at [facility]. [facility] requested the change in clinic and [facility] plans to transport pt to clinic while at snf per CSW. Contacted [facility] and clinic does have availability. Referral submitted to [facility] admissions today for clinic change. Likely will not receive approval until early next week due to holiday. Also spoke to HD RN who states that pt did have to be restrained today during HD. Pt will need to be able to tolerate HD in the chair without restraints prior to d/c. Update provided to team.   Melven Sartorius Renal Navigator [phone]

## 2022-12-03 NOTE — Progress Notes (Signed)
Received patient in bed ,alert to self,confused.Non compliant with the medical instructions. Renal MD a the the bedside,assessed patient,determined that patient would be interfering with her hemodialysis treatment.Order done and carried out.

## 2022-12-04 LAB — RENAL FUNCTION PANEL
Albumin: 2.4 g/dL — ABNORMAL LOW (ref 3.5–5.0)
Anion gap: 11 (ref 5–15)
BUN: 21 mg/dL (ref 8–23)
CO2: 31 mmol/L (ref 22–32)
Calcium: 9.6 mg/dL (ref 8.9–10.3)
Chloride: 94 mmol/L — ABNORMAL LOW (ref 98–111)
Creatinine, Ser: 4.82 mg/dL — ABNORMAL HIGH (ref 0.44–1.00)
GFR, Estimated: 9 mL/min — ABNORMAL LOW (ref >60)
Glucose, Bld: 94 mg/dL (ref 70–99)
Phosphorus: 4.7 mg/dL — ABNORMAL HIGH (ref 2.5–4.6)
Potassium: 3.7 mmol/L (ref 3.5–5.1)
Sodium: 136 mmol/L (ref 135–145)

## 2022-12-04 LAB — CBC
HCT: 26 % — ABNORMAL LOW (ref 36.0–46.0)
Hemoglobin: 9.1 g/dL — ABNORMAL LOW (ref 12.0–15.0)
MCH: 34.2 pg — ABNORMAL HIGH (ref 26.0–34.0)
MCHC: 35 g/dL (ref 30.0–36.0)
MCV: 97.7 fL (ref 80.0–100.0)
Platelets: 148 10*3/uL — ABNORMAL LOW (ref 150–400)
RBC: 2.66 MIL/uL — ABNORMAL LOW (ref 3.87–5.11)
RDW: 14.9 % (ref 11.5–15.5)
WBC: 4.6 10*3/uL (ref 4.0–10.5)
nRBC: 0 % (ref 0.0–0.2)

## 2022-12-04 LAB — CULTURE, BLOOD (ROUTINE X 2)
Culture: NO GROWTH
Special Requests: ADEQUATE

## 2022-12-04 MED ORDER — METOPROLOL TARTRATE 50 MG PO TABS
50.0000 mg | ORAL_TABLET | Freq: Two times a day (BID) | ORAL | Status: DC
  Administered 2022-12-04 – 2022-12-08 (×5): 50 mg via ORAL
  Filled 2022-12-04 (×8): qty 1

## 2022-12-04 MED ORDER — HYDRALAZINE HCL 50 MG PO TABS
100.0000 mg | ORAL_TABLET | Freq: Three times a day (TID) | ORAL | Status: DC
  Administered 2022-12-04: 100 mg via ORAL
  Filled 2022-12-04 (×2): qty 2

## 2022-12-04 NOTE — Progress Notes (Signed)
Hospital day#4 Subjective:   Summary: Diamond [name] is a 71 y.o. with a pertinent PMH of HTN, ESRD on dialysis TTS, atrial flutter, hx lumbar osteomylitis and MRSA bacteremia with infectious endocarditis admitted for AMS, now resolved,  and fever of unknown source with lumbar paravertebral inflammation on MRI, concerning for lumbar osteomyelitis   Overnight Events: None  Reports back pain is better today. Denies shortness of breath, chest pain, dysuria, fever, chills.   Objective:  Vital signs in last 24 hours: Vitals:   12/04/22 0500 12/04/22 0550 12/04/22 0759 12/04/22 1213  BP:  139/83 (!) 128/101 138/75  Pulse:  (!) 101 96 99  Resp:  _0 Temp:  98.6 F (37 C) 99.5 F (37.5 C) 100 F (37.8 C)  TempSrc:  Oral Oral Oral  SpO2:  100% 98% 100%  Weight: 54.2 kg     Height:       Supplemental O2: Room Air SpO2: 100 % Filed Weights   12/03/22 0930 12/03/22 1322 12/04/22 0500  Weight: 55.8 kg 54.1 kg 54.2 kg    Physical Exam:  Constitutional:chronically ill-appearing woman sitting in bed, in no acute distress HENT: normocephalic atraumatic, moist mucous membranes Neck: supple Cardiovascular: regular rate and rhythm, no m/r/g.  Pulmonary/Chest: normal work of breathing on room air, lungs CTAB Neurological: alert & oriented x 3, strength and sensation intact in lower extremities Skin: warm and dry Psych: Normal mood and affect  No intake or output data in the 24 hours ending 12/04/22 1309   Net IO Since Admission: -1,640 mL [12/04/22 1309]  Pertinent Labs:    Latest Ref Rng & Units 12/04/2022    3:45 AM 12/03/2022    1:47 AM 12/02/2022    5:40 AM  CBC  WBC 4.0 - 10.5 K/uL 4.6  4.6  3.6    3.6   Hemoglobin 12.0 - 15.0 g/dL 9.1  8.8  8.5    8.7   Hematocrit 36.0 - 46.0 % 26.0  25.6  25.9    25.8   Platelets 150 - 400 K/uL 148  152  138    141        Latest Ref Rng & Units 12/04/2022    3:45 AM 12/03/2022    1:47 AM 12/02/2022    5:40 AM  CMP   Glucose 70 - 99 mg/dL 94  91  98   BUN 8 - 23 mg/dL 21  38  28   Creatinine 0.44 - 1.00 mg/dL 4.82  6.56  5.33   Sodium 135 - 145 mmol/L 136  136  139   Potassium 3.5 - 5.1 mmol/L 3.7  4.0  3.9   Chloride 98 - 111 mmol/L 94  95  100   CO2 22 - 32 mmol/L _1 Calcium 8.9 - 10.3 mg/dL 9.6  10.0  9.2     Imaging: No results found.  Assessment/Plan:   Principal Problem:   Altered mental status Active Problems:   Discitis of lumbar region   ESRD on dialysis ([place])   AMS (altered mental status)   Fever in adult   Patient Summary: Diamond [name] is a 71 y.o. with a pertinent PMH of HTN, ESRD on dialysis TTS, atrial flutter, hx lumbar osteomylitis and MRSA bacteremia with infectious endocarditis who presented with AMS, now resolved, and fever of unknown origin, admitted for further infectious work up.    AMS, resolved Fever of unknown source Hx of recurrent  bacteremia 2/2 lumbar spine osteomyelitis Hx of infective endocarditis with R atrial vegetation Patient followed by ID outpatient for lumbar osteomyelitis. Last vanc before admission at HD 11/15/2022. Worsening lumbar pain over past 2 weeks, none on admission. New flank pain 12/27 - see below. Initially with high suspicion for worsening osteomyelitis vs lumbar abscess. Lumbar MRI with unchanged osteomyelitis at L2-L3, L4-5 with worsening paravertebral inflammatory changes. ESR 75 and CRP 16.2 on 12/27. Started on vancomycin and cefepime. Lactic acid initially elevated to >9.0, now wnl. Remains afebrile today without leukocytosis. No growth on cultures. Repeat ESR 82, CRP 12. ID has been consulted for antibiotic management. Will start suppressive bactrim. Remains medically stable for discharge. -ID consulted, appreciate recommendations -suppressive bactrim 1 tablet daily until ID f/u -Pain management: Tylenol  Bilateral low back pain with radiation anteriorly Patient endorsing bilateral flank pain 12/27 with radiation to her  lower abdomen and suprapubic area. This has migrated anywhere from low back to CVA but most recently this pain seems to localize to hips / lateral lumbar area. No nausea, vomiting, or chills. No red flag symptoms. Afebrile since admission. UA, urine culture clean. Feel this pain is most likely radicular in etiology. -continue to monitor for worsening / change in symptoms   ESRD HD on TTS Underwent HD 12/29. Required restraints. Will have to demonstrate she can undergo dialysis without restraints on 12/06/22 before transitioning back to outpatient dialysis.  -Continue Sevelamer 1600 mg TID  Atrial flutter Eliquis 2.5 mg BID  Hx R atrial mass and MRSA bacteremia TEE 12/22/2021 with right atrial/IVC globular mobile mass consistent with thombous/vegetation. Resolved on repeat echocardiogram. Finished IV antibiotics for this on 02/13/2022. No cardiac symptoms today.   HTN Pressures labile. Currently on hydralazine 100 mg TID. -Will restart home metoprolol tartrate 50 mg twice daily -continue hydralazine 100 mg tid  GERD  Protonix 40 mg  Diet: Renal with fluids VTE: On eliquis Code: Full PT/OT recs: pending Family Update: attempted. Will try again   Dispo: Anticipated discharge to  home  pending clinical improvement and antibiotic plan. Possibly discharge today.  Linward Natal, MD Internal Medicine Resident PGY-1 Please contact the on call pager after 5 pm and on weekends at [phone].

## 2022-12-04 NOTE — Progress Notes (Signed)
[facility] Progress Note   Subjective:   Seen in room, alert, calm, denies any concerns. No SOB, CP, dizziness or nausea.   Objective Vitals:   12/03/22 2100 12/04/22 0500 12/04/22 0550 12/04/22 0759  BP:   139/83 (!) 128/101  Pulse: 90  (!) 101 96  Resp:   17 17  Temp: 98 F (36.7 C)  98.6 F (37 C) 99.5 F (37.5 C)  TempSrc: Oral  Oral Oral  SpO2:   100% 98%  Weight:  54.2 kg    Height:       Physical Exam General:Elderly female, alert and in NAD Heart: RRR, no murmurs, rubs or gallops Lungs: CTA bilaterally Abdomen: Soft, non-distended, +BS Extremities: no edema b/l lower extremities Dialysis Access: LUE AVF + bruit  Additional Objective Labs: Basic Metabolic Panel: Recent Labs  Lab 12/02/22 0540 12/03/22 0147 12/04/22 0345  NA 139 136 136  K 3.9 4.0 3.7  CL 100 95* 94*  CO2 '28 26 31  '$ GLUCOSE 98 91 94  BUN 28* 38* 21  CREATININE 5.33* 6.56* 4.82*  CALCIUM 9.2 10.0 9.6  PHOS 3.9 5.2* 4.7*   Liver Function Tests: Recent Labs  Lab 11/29/22 2100 12/02/22 0540 12/03/22 0147 12/04/22 0345  AST 15  --   --   --   ALT 8  --   --   --   ALKPHOS 48  --   --   --   BILITOT 0.3  --   --   --   PROT 6.9  --   --   --   ALBUMIN 2.9* 2.3* 2.4* 2.4*   No results for input(s): "LIPASE", "AMYLASE" in the last 168 hours. CBC: Recent Labs  Lab 11/29/22 2100 11/29/22 2110 11/30/22 0536 12/01/22 0341 12/02/22 0540 12/03/22 0147 12/04/22 0345  WBC 6.8  --  5.5 4.3 3.6*  3.6* 4.6 4.6  NEUTROABS 5.3  --   --   --  2.1 2.9  --   HGB 10.0*   < > 9.4* 8.8* 8.7*  8.5* 8.8* 9.1*  HCT 32.9*   < > 28.4* 25.6* 25.8*  25.9* 25.6* 26.0*  MCV 109.3*  --  104.8* 100.0 100.8*  101.2* 98.5 97.7  PLT 125*  --  72* 127* 141*  138* 152 148*   < > = values in this interval not displayed.   Blood Culture    Component Value Date/Time   SDES URINE, CLEAN CATCH 12/01/2022 1114   SPECREQUEST NONE 12/01/2022 1114   CULT  12/01/2022 1114    NO  GROWTH Performed at [facility], [place] [address] [place], [place] [address]    REPTSTATUS 12/02/2022 FINAL 12/01/2022 1114    Cardiac Enzymes: No results for input(s): "CKTOTAL", "CKMB", "CKMBINDEX", "TROPONINI" in the last 168 hours. CBG: No results for input(s): "GLUCAP" in the last 168 hours. Iron Studies: No results for input(s): "IRON", "TIBC", "TRANSFERRIN", "FERRITIN" in the last 72 hours. '@lablastinr3'$ @ Studies/Results: No results found. Medications:   apixaban  2.5 mg Oral BID   Chlorhexidine Gluconate Cloth  6 each Topical Q0600   Chlorhexidine Gluconate Cloth  6 each Topical Q0600   darbepoetin (ARANESP) injection - DIALYSIS  100 mcg Subcutaneous Q Fri-1800   hydrALAZINE  100 mg Oral Q8H   mupirocin ointment  1 Application Nasal BID   pantoprazole  40 mg Oral Daily   sevelamer carbonate  1,600 mg Oral TID WC   sulfamethoxazole-trimethoprim  1 tablet Oral QHS  Outpatient Dialysis Orders: [facility]  4h  400/600   53.6kg  2/2 bath  Heparin none LUA AVF - last HD 12/23 , post wt 53.7kg - venofer 50 q wk - hectorol 2 mcg IV tiw - mircera 100 mcg q2, last 11/18 - last Hb 10.4 on 12/19  Assessment/Plan: Back pain / fever - temp was 101 in ED. Hx septic discitis multiple rounds in 2023. MRI here showed unchanged osteo at L2-L3, L4-5, possibly slight worsening. Started on IV vanc/ cefepime. Blood cx's neg, and is now afebrile. Plan is to d/c on bactrim for suppressive therapy ESRD - on HD TTS. Has been off schedule this week. Had HD yesterday but required restraints d/t agitation. Planned for HD today, will be late today due to high census HTN/ vol - BP's good, no vol excess on exam. Getting closer to EDW Anemia esrd - Hb 9.1- improved. Last OP esa was given 11/18. Will resume esa w/ darbe 100 ug weekly q Friday.  MBD ckd - CCa is a bit high, holding IV vdra for now. Phos in range, cont renvela as binder.   Hep C - per pmd Atrial fib - on eliquis, holding BB for  low HR.     Anice Paganini, PA-C 12/04/2022, 10:05 AM  [facility] Pager: [phone]

## 2022-12-05 DIAGNOSIS — R404 Transient alteration of awareness: Secondary | ICD-10-CM | POA: Diagnosis not present

## 2022-12-05 DIAGNOSIS — R509 Fever, unspecified: Secondary | ICD-10-CM | POA: Diagnosis not present

## 2022-12-05 LAB — RENAL FUNCTION PANEL
Albumin: 2.3 g/dL — ABNORMAL LOW (ref 3.5–5.0)
Anion gap: 16 — ABNORMAL HIGH (ref 5–15)
BUN: 35 mg/dL — ABNORMAL HIGH (ref 8–23)
CO2: 26 mmol/L (ref 22–32)
Calcium: 9.5 mg/dL (ref 8.9–10.3)
Chloride: 93 mmol/L — ABNORMAL LOW (ref 98–111)
Creatinine, Ser: 6.68 mg/dL — ABNORMAL HIGH (ref 0.44–1.00)
GFR, Estimated: 6 mL/min — ABNORMAL LOW (ref >60)
Glucose, Bld: 88 mg/dL (ref 70–99)
Phosphorus: 5.8 mg/dL — ABNORMAL HIGH (ref 2.5–4.6)
Potassium: 4.2 mmol/L (ref 3.5–5.1)
Sodium: 135 mmol/L (ref 135–145)

## 2022-12-05 LAB — CBC
HCT: 26.1 % — ABNORMAL LOW (ref 36.0–46.0)
Hemoglobin: 8.9 g/dL — ABNORMAL LOW (ref 12.0–15.0)
MCH: 33.8 pg (ref 26.0–34.0)
MCHC: 34.1 g/dL (ref 30.0–36.0)
MCV: 99.2 fL (ref 80.0–100.0)
Platelets: 137 10*3/uL — ABNORMAL LOW (ref 150–400)
RBC: 2.63 MIL/uL — ABNORMAL LOW (ref 3.87–5.11)
RDW: 14.9 % (ref 11.5–15.5)
WBC: 4 10*3/uL (ref 4.0–10.5)
nRBC: 0 % (ref 0.0–0.2)

## 2022-12-05 LAB — GLUCOSE, CAPILLARY: Glucose-Capillary: 112 mg/dL — ABNORMAL HIGH (ref 70–99)

## 2022-12-05 LAB — HEPATITIS C GENOTYPE

## 2022-12-05 MED ORDER — GUAIFENESIN 100 MG/5ML PO LIQD
5.0000 mL | ORAL | Status: DC | PRN
  Administered 2022-12-06 – 2022-12-07 (×2): 5 mL via ORAL
  Filled 2022-12-05 (×2): qty 15

## 2022-12-05 MED ORDER — HYDRALAZINE HCL 50 MG PO TABS
100.0000 mg | ORAL_TABLET | ORAL | Status: DC
  Filled 2022-12-05: qty 2

## 2022-12-05 NOTE — Progress Notes (Signed)
Hospital day#5 Subjective:   Summary: Diamond [name] is a 71 y.o. with a pertinent PMH of HTN, ESRD on dialysis TTS, atrial flutter, hx lumbar osteomylitis and MRSA bacteremia with infectious endocarditis admitted for AMS, now resolved,  and fever of unknown source with lumbar paravertebral inflammation on MRI, concerning for lumbar osteomyelitis   Overnight Events: None  Patient denies any chest pain or shortness of breath today. She does report a new non-productive cough. She also states she feels congested.  Objective:  Vital signs in last 24 hours: Vitals:   12/04/22 1626 12/04/22 1939 12/05/22 0415 12/05/22 0809  BP: (!) 129/91 (!) 128/102 (!) 145/98 (!) 157/107  Pulse: 90 97 82 80  Resp: _0 Temp: 98.3 F (36.8 C) 98.7 F (37.1 C) 98.6 F (37 C) 98.3 F (36.8 C)  TempSrc: Oral Oral Oral Oral  SpO2: 98% 97% 99% 97%  Weight:      Height:       Supplemental O2: Room Air SpO2: 97 % Filed Weights   12/03/22 0930 12/03/22 1322 12/04/22 0500  Weight: 55.8 kg 54.1 kg 54.2 kg    Physical Exam:  Constitutional:chronically ill-appearing woman sitting in bed, in no acute distress HENT: normocephalic atraumatic, moist mucous membranes Neck: supple Cardiovascular: regular rate and rhythm, no m/r/g. No lower extremity edema. Pulmonary/Chest: normal work of breathing on room air, lungs CTAB Neurological: alert & oriented x 3, moves extremities spontaneously Skin: warm and dry Psych: Normal mood and affect  No intake or output data in the 24 hours ending 12/05/22 1211   Net IO Since Admission: -1,640 mL [12/05/22 1211]  Pertinent Labs:    Latest Ref Rng & Units 12/05/2022    1:41 AM 12/04/2022    3:45 AM 12/03/2022    1:47 AM  CBC  WBC 4.0 - 10.5 K/uL 4.0  4.6  4.6   Hemoglobin 12.0 - 15.0 g/dL 8.9  9.1  8.8   Hematocrit 36.0 - 46.0 % 26.1  26.0  25.6   Platelets 150 - 400 K/uL 137  148  152        Latest Ref Rng & Units 12/05/2022    1:41 AM  12/04/2022    3:45 AM 12/03/2022    1:47 AM  CMP  Glucose 70 - 99 mg/dL 88  94  91   BUN 8 - 23 mg/dL 35  21  38   Creatinine 0.44 - 1.00 mg/dL 6.68  4.82  6.56   Sodium 135 - 145 mmol/L 135  136  136   Potassium 3.5 - 5.1 mmol/L 4.2  3.7  4.0   Chloride 98 - 111 mmol/L 93  94  95   CO2 22 - 32 mmol/L _1 Calcium 8.9 - 10.3 mg/dL 9.5  9.6  10.0     Imaging: No results found.  Assessment/Plan:   Principal Problem:   Altered mental status Active Problems:   Discitis of lumbar region   ESRD on dialysis ([place])   AMS (altered mental status)   Fever in adult   Patient Summary: Diamond [name] is a 71 y.o. with a pertinent PMH of HTN, ESRD on dialysis TTS, atrial flutter, hx lumbar osteomylitis and MRSA bacteremia with infectious endocarditis who presented with AMS, now resolved, and fever of unknown origin, admitted for further infectious work up.    AMS, resolved Fever of unknown source Hx of recurrent bacteremia 2/2 lumbar spine osteomyelitis Hx  of infective endocarditis with R atrial vegetation Patient followed by ID outpatient for lumbar osteomyelitis. Last vanc before admission at HD 11/15/2022. Worsening lumbar pain over past 2 weeks, none on admission. New flank pain 12/27 - see below. Initially with high suspicion for worsening osteomyelitis vs lumbar abscess. Lumbar MRI with unchanged osteomyelitis at L2-L3, L4-5 with worsening paravertebral inflammatory changes. ESR 75 and CRP 16.2 on 12/27. Started on vancomycin and cefepime. Lactic acid initially elevated to >9.0, since resolved.  Repeat ESR 82, CRP 12.  Remains afebrile today without leukocytosis. No growth on cultures. ID has been consulted for antibiotic management. Recommend suppressive bactrim. Remains medically stable for discharge. -ID consulted, appreciate recommendations -suppressive bactrim 1 tablet daily until ID f/u -Pain management: Tylenol  Bilateral low back pain with radiation anteriorly Patient  endorsed bilateral flank pain 12/27 with radiation to her lower abdomen and suprapubic area. This has migrated anywhere from low back to CVA during admission but most recently this pain seems to localize to hips / lateral lumbar area. No nausea, vomiting, or chills. No red flag symptoms. Afebrile since admission. UA, urine culture clean. Feel this pain is most likely radicular in etiology. Pain has improved over the course of the last few days. -continue to monitor for worsening / change in symptoms   ESRD HD on TTS Underwent HD 12/29. Required restraints. Will have to demonstrate she can undergo dialysis without restraints on 12/06/22 before transitioning back to outpatient dialysis. Plans for HD later today. -Continue Sevelamer 1600 mg TID  Atrial flutter Eliquis 2.5 mg BID  Hx R atrial mass and MRSA bacteremia TEE 12/22/2021 with right atrial/IVC globular mobile mass consistent with thombous/vegetation. Resolved on repeat echocardiogram. Finished IV antibiotics for this on 02/13/2022. No cardiac symptoms today.   HTN Pressures labile. Currently on hydralazine 100 mg TID. -Will restart home metoprolol tartrate 50 mg twice daily -will transition to home hydralazine regimen (hydralazine 100 mg TID on non-dialysis days, BID on dialysis days)  GERD  Protonix 40 mg  Diet: Renal with fluids VTE: On eliquis Code: Full PT/OT recs: pending Family Update: attempted. Will try again   Dispo: Anticipated discharge to  home  pending clinical improvement and antibiotic plan. Possibly discharge today.  Linward Natal, MD Internal Medicine Resident PGY-1 Please contact the on call pager after 5 pm and on weekends at [phone].

## 2022-12-05 NOTE — Progress Notes (Signed)
Pt stable for HD

## 2022-12-05 NOTE — Progress Notes (Signed)
Received patient in bed to unit.  Alert and oriented.  Informed consent signed and in chart.   Treatment initiated: 14:05 Treatment completed: 16:55  Patient tolerated well.  Transported back to the room  Alert, without acute distress.  Hand-off given to patient's nurse.   Access used: left AVF Access issues: none  Total UF removed: 0 Medication(s) given: none     12/05/22 1655  Vitals  Temp 98.3 F (36.8 C)  BP (!) 140/91  MAP (mmHg) 105  BP Location Right Arm  BP Method Automatic  Patient Position (if appropriate) Lying  Pulse Rate 77  Pulse Rate Source Monitor  ECG Heart Rate 77  Resp 14  Oxygen Therapy  SpO2 96 %  O2 Device Room Air  Patient Activity (if Appropriate) In bed  Pulse Oximetry Type Continuous  During Treatment Monitoring  HD Safety Checks Performed Yes  Intra-Hemodialysis Comments Tolerated well;Tx completed  Dialysis Fluid Bolus Normal Saline  Bolus Amount (mL) 300 mL  Fistula / Graft Left Forearm Arteriovenous fistula  Placement Date/Time: (c) 12/23/21 1646   Placed prior to admission: No  Orientation: Left  Access Location: Forearm  Access Type: Arteriovenous fistula  Site Condition No complications  Fistula / Graft Assessment Present;Thrill;Bruit  Status Deaccessed  Drainage Description None      Aurthur Winger[facility]

## 2022-12-05 NOTE — Progress Notes (Signed)
[facility] Progress Note   Subjective:   No overnight events reported. On schedule for HD today. Reports she has a cough but no CP, SOB or dizziness.   Objective Vitals:   12/04/22 1626 12/04/22 1939 12/05/22 0415 12/05/22 0809  BP: (!) 129/91 (!) 128/102 (!) 145/98 (!) 157/107  Pulse: 90 97 82 80  Resp: '16 16 16 17  '$ Temp: 98.3 F (36.8 C) 98.7 F (37.1 C) 98.6 F (37 C) 98.3 F (36.8 C)  TempSrc: Oral Oral Oral Oral  SpO2: 98% 97% 99% 97%  Weight:      Height:       Physical Exam General: Elderly female, alert and in NAD Heart: RRR, no murmurs, rubs or gallops Lungs: CTA bilaterally, respirations unlabored on RA Abdomen: Soft, non-distended, +BS Extremities: No edema b/l lower extremities Dialysis Access: LUE AVF + Bruit  Additional Objective Labs: Basic Metabolic Panel: Recent Labs  Lab 12/03/22 0147 12/04/22 0345 12/05/22 0141  NA 136 136 135  K 4.0 3.7 4.2  CL 95* 94* 93*  CO2 '26 31 26  '$ GLUCOSE 91 94 88  BUN 38* 21 35*  CREATININE 6.56* 4.82* 6.68*  CALCIUM 10.0 9.6 9.5  PHOS 5.2* 4.7* 5.8*   Liver Function Tests: Recent Labs  Lab 11/29/22 2100 12/02/22 0540 12/03/22 0147 12/04/22 0345 12/05/22 0141  AST 15  --   --   --   --   ALT 8  --   --   --   --   ALKPHOS 48  --   --   --   --   BILITOT 0.3  --   --   --   --   PROT 6.9  --   --   --   --   ALBUMIN 2.9*   < > 2.4* 2.4* 2.3*   < > = values in this interval not displayed.   No results for input(s): "LIPASE", "AMYLASE" in the last 168 hours. CBC: Recent Labs  Lab 11/29/22 2100 11/29/22 2110 12/01/22 0341 12/02/22 0540 12/03/22 0147 12/04/22 0345 12/05/22 0141  WBC 6.8   < > 4.3 3.6*  3.6* 4.6 4.6 4.0  NEUTROABS 5.3  --   --  2.1 2.9  --   --   HGB 10.0*   < > 8.8* 8.7*  8.5* 8.8* 9.1* 8.9*  HCT 32.9*   < > 25.6* 25.8*  25.9* 25.6* 26.0* 26.1*  MCV 109.3*   < > 100.0 100.8*  101.2* 98.5 97.7 99.2  PLT 125*   < > 127* 141*  138* 152 148* 137*   < > = values  in this interval not displayed.   Blood Culture    Component Value Date/Time   SDES URINE, CLEAN CATCH 12/01/2022 1114   SPECREQUEST NONE 12/01/2022 1114   CULT  12/01/2022 1114    NO GROWTH Performed at [facility], [place] [address] [place], [place] [address]    REPTSTATUS 12/02/2022 FINAL 12/01/2022 1114    Cardiac Enzymes: No results for input(s): "CKTOTAL", "CKMB", "CKMBINDEX", "TROPONINI" in the last 168 hours. CBG: No results for input(s): "GLUCAP" in the last 168 hours. Iron Studies: No results for input(s): "IRON", "TIBC", "TRANSFERRIN", "FERRITIN" in the last 72 hours. '@lablastinr3'$ @ Studies/Results: No results found. Medications:   apixaban  2.5 mg Oral BID   Chlorhexidine Gluconate Cloth  6 each Topical Q0600   Chlorhexidine Gluconate Cloth  6 each Topical Q0600   darbepoetin (ARANESP) injection - DIALYSIS  100  mcg Subcutaneous Q Fri-1800   hydrALAZINE  100 mg Oral TID   metoprolol tartrate  50 mg Oral BID   mupirocin ointment  1 Application Nasal BID   pantoprazole  40 mg Oral Daily   sevelamer carbonate  1,600 mg Oral TID WC   sulfamethoxazole-trimethoprim  1 tablet Oral QHS    Dialysis Orders: [facility]  4h  400/600   53.6kg  2/2 bath  Heparin none LUA AVF - last HD 12/23 , post wt 53.7kg - venofer 50 q wk - hectorol 2 mcg IV tiw - mircera 100 mcg q2, last 11/18 - last Hb 10.4 on 12/19  Assessment/Plan: Back pain / fever - temp was 101 in ED. Hx septic discitis multiple rounds in 2023. MRI here showed unchanged osteo at L2-L3, L4-5, possibly slight worsening. Started on IV vanc/ cefepime. Blood cx's neg, and is now afebrile. Plan is to d/c on bactrim for suppressive therapy ESRD - on HD TTS. Has been off schedule this week. Had HD Friday,  but required restraints d/t agitation. Planned for HD today, will be late today due to high census, then resume TTS schedule HTN/ vol - BP's good, no vol excess on exam. Getting closer to EDW Anemia esrd - Hb 8.9.  Last OP esa was given 11/18. Will resume esa w/ darbe 100 ug weekly q Friday.  MBD ckd - CCa is a bit high, holding IV vdra for now. Phos 5.8, cont renvela as binder.   Hep C - per pmd Atrial fib - on eliquis, holding BB for low HR.  Mgt per cardiology  Anice Paganini, PA-C 12/05/2022, 9:26 AM  [facility] Pager: [phone]

## 2022-12-06 LAB — CBC
HCT: 27.7 % — ABNORMAL LOW (ref 36.0–46.0)
Hemoglobin: 9.3 g/dL — ABNORMAL LOW (ref 12.0–15.0)
MCH: 33.3 pg (ref 26.0–34.0)
MCHC: 33.6 g/dL (ref 30.0–36.0)
MCV: 99.3 fL (ref 80.0–100.0)
Platelets: 162 10*3/uL (ref 150–400)
RBC: 2.79 MIL/uL — ABNORMAL LOW (ref 3.87–5.11)
RDW: 14.6 % (ref 11.5–15.5)
WBC: 2.6 10*3/uL — ABNORMAL LOW (ref 4.0–10.5)
nRBC: 0 % (ref 0.0–0.2)

## 2022-12-06 LAB — RENAL FUNCTION PANEL
Albumin: 2.4 g/dL — ABNORMAL LOW (ref 3.5–5.0)
Anion gap: 16 — ABNORMAL HIGH (ref 5–15)
BUN: 24 mg/dL — ABNORMAL HIGH (ref 8–23)
CO2: 27 mmol/L (ref 22–32)
Calcium: 9.6 mg/dL (ref 8.9–10.3)
Chloride: 90 mmol/L — ABNORMAL LOW (ref 98–111)
Creatinine, Ser: 4.79 mg/dL — ABNORMAL HIGH (ref 0.44–1.00)
GFR, Estimated: 9 mL/min — ABNORMAL LOW (ref >60)
Glucose, Bld: 112 mg/dL — ABNORMAL HIGH (ref 70–99)
Phosphorus: 4 mg/dL (ref 2.5–4.6)
Potassium: 3.4 mmol/L — ABNORMAL LOW (ref 3.5–5.1)
Sodium: 133 mmol/L — ABNORMAL LOW (ref 135–145)

## 2022-12-06 MED ORDER — POTASSIUM CHLORIDE CRYS ER 20 MEQ PO TBCR
40.0000 meq | EXTENDED_RELEASE_TABLET | Freq: Two times a day (BID) | ORAL | Status: AC
  Administered 2022-12-06 (×2): 40 meq via ORAL
  Filled 2022-12-06 (×2): qty 2

## 2022-12-06 MED ORDER — CHLORHEXIDINE GLUCONATE CLOTH 2 % EX PADS
6.0000 | MEDICATED_PAD | Freq: Every day | CUTANEOUS | Status: DC
  Administered 2022-12-07 – 2022-12-08 (×2): 6 via TOPICAL

## 2022-12-06 NOTE — Progress Notes (Signed)
[name] Progress Note   Subjective:   HD yest-  no volume removed by design  -  feels fine- wants to go home-  primary team in discussion regarding that   Objective Vitals:   12/05/22 1657 12/05/22 2026 12/06/22 0601 12/06/22 0839  BP: (!) 162/90 (!) 160/99 (!) 143/90 (!) 145/94  Pulse: 79 83 71 76  Resp: '15 16 16 16  '$ Temp:  98 F (36.7 C) 98.4 F (36.9 C) 98.9 F (37.2 C)  TempSrc:  Oral Oral Oral  SpO2: 97% 100% 100% 98%  Weight:      Height:       Physical Exam General: Elderly female, alert and in NAD Heart: RRR, no murmurs, rubs or gallops Lungs: CTA bilaterally, respirations unlabored on RA Abdomen: Soft, non-distended, +BS Extremities: No edema b/l lower extremities Dialysis Access: LUE AVF + Bruit  Additional Objective Labs: Basic Metabolic Panel: Recent Labs  Lab 12/04/22 0345 12/05/22 0141 12/06/22 0219  NA 136 135 133*  K 3.7 4.2 3.4*  CL 94* 93* 90*  CO2 '31 26 27  '$ GLUCOSE 94 88 112*  BUN 21 35* 24*  CREATININE 4.82* 6.68* 4.79*  CALCIUM 9.6 9.5 9.6  PHOS 4.7* 5.8* 4.0   Liver Function Tests: Recent Labs  Lab 11/29/22 2100 12/02/22 0540 12/04/22 0345 12/05/22 0141 12/06/22 0219  AST 15  --   --   --   --   ALT 8  --   --   --   --   ALKPHOS 48  --   --   --   --   BILITOT 0.3  --   --   --   --   PROT 6.9  --   --   --   --   ALBUMIN 2.9*   < > 2.4* 2.3* 2.4*   < > = values in this interval not displayed.   No results for input(s): "LIPASE", "AMYLASE" in the last 168 hours. CBC: Recent Labs  Lab 11/29/22 2100 11/29/22 2110 12/02/22 0540 12/03/22 0147 12/04/22 0345 12/05/22 0141 12/06/22 0219  WBC 6.8   < > 3.6*  3.6* 4.6 4.6 4.0 2.6*  NEUTROABS 5.3  --  2.1 2.9  --   --   --   HGB 10.0*   < > 8.7*  8.5* 8.8* 9.1* 8.9* 9.3*  HCT 32.9*   < > 25.8*  25.9* 25.6* 26.0* 26.1* 27.7*  MCV 109.3*   < > 100.8*  101.2* 98.5 97.7 99.2 99.3  PLT 125*   < > 141*  138* 152 148* 137* 162   < > = values in this  interval not displayed.   Blood Culture    Component Value Date/Time   SDES URINE, CLEAN CATCH 12/01/2022 1114   SPECREQUEST NONE 12/01/2022 1114   CULT  12/01/2022 1114    NO GROWTH Performed at [facility], [place] [address] [place], [place] [address]    REPTSTATUS 12/02/2022 FINAL 12/01/2022 1114    Cardiac Enzymes: No results for input(s): "CKTOTAL", "CKMB", "CKMBINDEX", "TROPONINI" in the last 168 hours. CBG: Recent Labs  Lab 12/05/22 2155  GLUCAP 112*   Iron Studies: No results for input(s): "IRON", "TIBC", "TRANSFERRIN", "FERRITIN" in the last 72 hours. '@lablastinr3'$ @ Studies/Results: No results found. Medications:   apixaban  2.5 mg Oral BID   Chlorhexidine Gluconate Cloth  6 each Topical Q0600   Chlorhexidine Gluconate Cloth  6 each Topical Q0600   darbepoetin (ARANESP) injection - DIALYSIS  100 mcg Subcutaneous Q Fri-1800   [START ON 12/07/2022] hydrALAZINE  100 mg Oral 2 times per day on Tue Thu Sat   metoprolol tartrate  50 mg Oral BID   pantoprazole  40 mg Oral Daily   potassium chloride  40 mEq Oral BID   sevelamer carbonate  1,600 mg Oral TID WC   sulfamethoxazole-trimethoprim  1 tablet Oral QHS    Dialysis Orders: [facility]  4h  400/600   53.6kg  2/2 bath  Heparin none LUA AVF - last HD 12/23 , post wt 53.7kg - venofer 50 q wk - hectorol 2 mcg IV tiw - mircera 100 mcg q2, last 11/18 - last Hb 10.4 on 12/19  Assessment/Plan: Back pain / fever - temp was 101 in ED. Hx septic discitis multiple rounds in 2023. MRI here showed unchanged osteo at L2-L3, L4-5, possibly slight worsening. Started on IV vanc/ cefepime. Blood cx's neg, and is now afebrile. Plan is to d/c on bactrim for suppressive therapy ESRD - on HD TTS. Has been off schedule this week. Had HD Friday,  but required restraints d/t agitation. HD yesterday went well , plan to  resume TTS schedule-  so next HD will be tomorrow -  can be here or at OP unit if happens to go home HTN/ vol - BP's  good, no vol excess on exam. Getting closer to EDW-  now actually reading as under as of yesterday -  no volume removed-  EDW will need to be adjusted down at discharge Anemia esrd - Hb 8.9. Last OP esa was given 11/18.resumed esa w/ darbe 100 ug weekly q Friday.  MBD ckd - CCa is a bit high, holding IV vdra for now. Phos 5.8, cont renvela as binder.   Hep C - per pmd Atrial fib - on eliquis, holding BB for low HR.  Mgt per cardiology  Louis Meckel  12/06/2022, 9:06 AM   [facility]

## 2022-12-06 NOTE — Progress Notes (Signed)
Physical Therapy Treatment Patient Details Name: Diamond [name] MRN: [id number] DOB: [birth year] Today's Date: 12/06/2022   History of Present Illness Pt is a 72 y.o. female admitted 11/29/22 with AMS and fever of unknown source concerning for lumbar osteomyelitis vs abscess. MRI 12/26 with unchanged findings of discitis-osteomyelitis L2-L5; no abscess. Head CT negative for acute injury; remote lacunar infarcts in R basal ganglia and L insular cortex. Of note, recent admission 09/2022 with progression of lumbar discitis/osteomyelitis. Other PMH includes asthma, aflutter, ESRD (HD TTS), HTN, gout, MRSA bacteremia with infectious endocarditis.    PT Comments    Pt difficult to motivate this date, pt perseverative on room being cold. Pt requiring up to mod physical assist for moving to/from EOB, pt declines all further mobility. PT discussed with pt current recommendations of SNF and according to chart review, pt was refusing. However, pt now stating she is agreeable to d/c to SNF. Plan remains appropriate, will continue to follow.    Recommendations for follow up therapy are one component of a multi-disciplinary discharge planning process, led by the attending physician.  Recommendations may be updated based on patient status, additional functional criteria and insurance authorization.  Follow Up Recommendations  Skilled nursing-short term rehab (<3 hours/day)     Assistance Recommended at Discharge Frequent or constant Supervision/Assistance  Patient can return home with the following A lot of help with walking and/or transfers;A lot of help with bathing/dressing/bathroom;Assistance with cooking/housework;Direct supervision/assist for medications management;Direct supervision/assist for financial management;Assist for transportation;Help with stairs or ramp for entrance   Equipment Recommendations  None recommended by PT    Recommendations for Other Services       Precautions /  Restrictions Precautions Precautions: Fall Restrictions Weight Bearing Restrictions: No     Mobility  Bed Mobility Overal bed mobility: Needs Assistance Bed Mobility: Rolling, Sidelying to Sit, Sit to Sidelying Rolling: Supervision Sidelying to sit: Mod assist     Sit to sidelying: Min assist General bed mobility comments: min-mod assist for log roll technique to/from EOB, pt able to elevate trunk off of bed without PT assist and with use of bedrails    Transfers                   General transfer comment: pt refuses    Ambulation/Gait                   Stairs             Wheelchair Mobility    Modified Rankin (Stroke Patients Only)       Balance Overall balance assessment: Needs assistance Sitting-balance support: No upper extremity supported, Feet supported Sitting balance-Leahy Scale: Fair Sitting balance - Comments: able to sit EOB without PT support, x5 mins                                    Cognition Arousal/Alertness: Awake/alert Behavior During Therapy: Flat affect Overall Cognitive Status: No family/caregiver present to determine baseline cognitive functioning Area of Impairment: Attention, Memory, Following commands, Safety/judgement, Awareness, Problem solving                   Current Attention Level: Sustained   Following Commands: Follows one step commands inconsistently, Follows one step commands with increased time Safety/Judgement: Decreased awareness of deficits, Decreased awareness of safety Awareness: Intellectual Problem Solving: Slow processing, Requires verbal cues, Requires tactile cues, Decreased initiation, Difficulty  sequencing General Comments: pt repeating back mobility cues to PT as if she does not understand throughout session, slowed processing        Exercises General Exercises - Lower Extremity Long Arc Quad: AROM, Both, 10 reps, Seated    General Comments         Pertinent Vitals/Pain Pain Assessment Pain Assessment: No/denies pain    Home Living                          Prior Function            PT Goals (current goals can now be found in the care plan section) Acute Rehab PT Goals Patient Stated Goal: return home, willing to consider ST rehab PT Goal Formulation: With patient Time For Goal Achievement: 12/14/22 Potential to Achieve Goals: Fair Progress towards PT goals: Not progressing toward goals - comment (limited engagement this date)    Frequency    Min 2X/week      PT Plan Current plan remains appropriate    Co-evaluation              AM-PAC PT "6 Clicks" Mobility   Outcome Measure  Help needed turning from your back to your side while in a flat bed without using bedrails?: A Little Help needed moving from lying on your back to sitting on the side of a flat bed without using bedrails?: A Lot Help needed moving to and from a bed to a chair (including a wheelchair)?: Total Help needed standing up from a chair using your arms (e.g., wheelchair or bedside chair)?: Total Help needed to walk in hospital room?: Total Help needed climbing 3-5 steps with a railing? : Total 6 Click Score: 9    End of Session   Activity Tolerance: Patient limited by fatigue Patient left: with call bell/phone within reach;in bed;with bed alarm set Nurse Communication: Mobility status PT Visit Diagnosis: Other abnormalities of gait and mobility (R26.89);Muscle weakness (generalized) (M62.81)     Time: 6010-9323 PT Time Calculation (min) (ACUTE ONLY): 16 min  Charges:  $Therapeutic Activity: 8-22 mins                    Stacie Glaze, PT DPT Acute Rehabilitation Services Pager [phone]  Office [phone]    Roxine Caddy E Ruffin Pyo 12/06/2022, 5:23 PM

## 2022-12-06 NOTE — Progress Notes (Addendum)
Hospital day#6 Subjective:   Summary: Diamond [name] is a 72 y.o. with a pertinent PMH of HTN, ESRD on dialysis TTS, atrial flutter, hx lumbar osteomylitis and MRSA bacteremia with infectious endocarditis admitted for AMS, now resolved,  and fever of unknown source with lumbar paravertebral inflammation on MRI, concerning for lumbar osteomyelitis   Overnight Events: None  Patient states she feels well today. Was able to undergo dialysis without restraints yesterday. Discussed SNF recommendation and patient adamant on going home. Reports she has not gotten out of bed in several days. She states her daughter comes to see her frequently but lives alone. Does not seem that daughter would be able to come daily. Back pain continues to improve. Still with some right knee pain. Denies SOB, chest pain.  Objective:  Vital signs in last 24 hours: Vitals:   12/05/22 1657 12/05/22 2026 12/06/22 0601 12/06/22 0839  BP: (!) 162/90 (!) 160/99 (!) 143/90 (!) 145/94  Pulse: 79 83 71 76  Resp: _0 Temp:  98 F (36.7 C) 98.4 F (36.9 C) 98.9 F (37.2 C)  TempSrc:  Oral Oral Oral  SpO2: 97% 100% 100% 98%  Weight:      Height:       Supplemental O2: Room Air SpO2: 98 % Filed Weights   12/04/22 0500 12/05/22 1352 12/05/22 1655  Weight: 54.2 kg 51 kg 51 kg    Physical Exam:  Constitutional: chronically ill-appearing woman sitting in bed, in no acute distress HENT: normocephalic atraumatic, moist mucous membranes Neck: supple Pulmonary/Chest: normal work of breathing on room air Neurological: alert & oriented x 3, moves extremities spontaneously Skin: warm and dry Psych: Normal mood and affect   Intake/Output Summary (Last 24 hours) at 12/06/2022 0922 Last data filed at 12/05/2022 1657 Gross per 24 hour  Intake --  Output 0 ml  Net 0 ml     Net IO Since Admission: -1,640 mL [12/06/22 0922]  Pertinent Labs:    Latest Ref Rng & Units 12/06/2022    2:19 AM 12/05/2022    1:41  AM 12/04/2022    3:45 AM  CBC  WBC 4.0 - 10.5 K/uL 2.6  4.0  4.6   Hemoglobin 12.0 - 15.0 g/dL 9.3  8.9  9.1   Hematocrit 36.0 - 46.0 % 27.7  26.1  26.0   Platelets 150 - 400 K/uL 162  137  148        Latest Ref Rng & Units 12/06/2022    2:19 AM 12/05/2022    1:41 AM 12/04/2022    3:45 AM  CMP  Glucose 70 - 99 mg/dL 112  88  94   BUN 8 - 23 mg/dL 24  35  21   Creatinine 0.44 - 1.00 mg/dL 4.79  6.68  4.82   Sodium 135 - 145 mmol/L 133  135  136   Potassium 3.5 - 5.1 mmol/L 3.4  4.2  3.7   Chloride 98 - 111 mmol/L 90  93  94   CO2 22 - 32 mmol/L _1 Calcium 8.9 - 10.3 mg/dL 9.6  9.5  9.6     Imaging: No results found.  Assessment/Plan:   Principal Problem:   Altered mental status Active Problems:   Discitis of lumbar region   ESRD on dialysis [facility])   AMS (altered mental status)   Fever in adult   Patient Summary: Diamond [name] is a 72 y.o. with a  pertinent PMH of HTN, ESRD on dialysis TTS, atrial flutter, hx lumbar osteomylitis and MRSA bacteremia with infectious endocarditis who presented with AMS, now resolved, and fever of unknown origin, admitted for further infectious work up.    AMS, resolved Fever of unknown source Hx of recurrent bacteremia 2/2 lumbar spine osteomyelitis Hx of infective endocarditis with R atrial vegetation Patient followed by ID outpatient for lumbar osteomyelitis. Last vanc before admission at HD 11/15/2022. Worsening lumbar pain over past 2 weeks, none on admission. New flank pain 12/27 - see below. Initially with high suspicion for worsening osteomyelitis vs lumbar abscess. Lumbar MRI with unchanged osteomyelitis at L2-L3, L4-5 with worsening paravertebral inflammatory changes. ESR 75 and CRP 16.2 on 12/27. Started on vancomycin and cefepime. Lactic acid initially elevated to >9.0, since resolved.  Repeat ESR 82, CRP 12.  Remains afebrile today without leukocytosis. No growth on cultures. ID consulted for antibiotic management and  recommended suppressive bactrim. Remains medically stable for discharge. Will have PT come re-evaluate given patient adamant about going home instead of to SNF. Has bed at [facility] after son accepted offer last week. Does not seem safe for discharge home at this point. -ID consulted, appreciate recommendations -suppressive bactrim 1 tablet daily until ID f/u -Pain management: Tylenol  Bilateral low back pain with radiation anteriorly Patient endorsed bilateral flank pain 12/27 with radiation to her lower abdomen and suprapubic area. This has migrated anywhere from low back to CVA during admission but most recently this pain seems to localize to hips / lateral lumbar area. No nausea, vomiting, or chills. No red flag symptoms. Afebrile since admission. UA, urine culture clean. Feel this pain is most likely radicular in etiology. Pain has improved over the course of the last few days. -continue to monitor for worsening / change in symptoms   ESRD HD on TTS Anemia Underwent HD 12/31. Tolerated well without restraints. Plans for HD tomorrow and this can be down here or outpatient. -Continue Sevelamer 1600 mg TID -ESA and Aranesp weekly  Atrial flutter Eliquis 2.5 mg BID  Hx R atrial mass and MRSA bacteremia TEE 12/22/2021 with right atrial/IVC globular mobile mass consistent with thombous/vegetation. Resolved on repeat echocardiogram. Finished IV antibiotics for this on 02/13/2022. No cardiac symptoms today.   HTN Hypertensive but improving. Currently on hydralazine 100 mg BID on dialysis days, TID on non-dialysis days.  -home metoprolol tartrate 50 mg twice daily -home hydralazine   GERD  Protonix 40 mg  Diet: Renal with fluids VTE: On eliquis Code: Full PT/OT recs: pending Family Update: attempted. Will try again   Dispo: Anticipated discharge to  home  pending clinical improvement and antibiotic plan. Possibly discharge today.  Linward Natal, MD Internal Medicine Resident  PGY-1 Please contact the on call pager after 5 pm and on weekends at [phone].

## 2022-12-07 DIAGNOSIS — Z992 Dependence on renal dialysis: Secondary | ICD-10-CM | POA: Diagnosis not present

## 2022-12-07 DIAGNOSIS — R051 Acute cough: Secondary | ICD-10-CM

## 2022-12-07 DIAGNOSIS — N186 End stage renal disease: Secondary | ICD-10-CM | POA: Diagnosis not present

## 2022-12-07 LAB — CBC
HCT: 27.4 % — ABNORMAL LOW (ref 36.0–46.0)
Hemoglobin: 8.9 g/dL — ABNORMAL LOW (ref 12.0–15.0)
MCH: 32.7 pg (ref 26.0–34.0)
MCHC: 32.5 g/dL (ref 30.0–36.0)
MCV: 100.7 fL — ABNORMAL HIGH (ref 80.0–100.0)
Platelets: 167 10*3/uL (ref 150–400)
RBC: 2.72 MIL/uL — ABNORMAL LOW (ref 3.87–5.11)
RDW: 14.6 % (ref 11.5–15.5)
WBC: 2.5 10*3/uL — ABNORMAL LOW (ref 4.0–10.5)
nRBC: 0 % (ref 0.0–0.2)

## 2022-12-07 LAB — RENAL FUNCTION PANEL
Albumin: 2.3 g/dL — ABNORMAL LOW (ref 3.5–5.0)
Anion gap: 14 (ref 5–15)
BUN: 36 mg/dL — ABNORMAL HIGH (ref 8–23)
CO2: 27 mmol/L (ref 22–32)
Calcium: 9.6 mg/dL (ref 8.9–10.3)
Chloride: 94 mmol/L — ABNORMAL LOW (ref 98–111)
Creatinine, Ser: 6.4 mg/dL — ABNORMAL HIGH (ref 0.44–1.00)
GFR, Estimated: 6 mL/min — ABNORMAL LOW (ref >60)
Glucose, Bld: 98 mg/dL (ref 70–99)
Phosphorus: 4.6 mg/dL (ref 2.5–4.6)
Potassium: 5 mmol/L (ref 3.5–5.1)
Sodium: 135 mmol/L (ref 135–145)

## 2022-12-07 MED ORDER — ALBUTEROL SULFATE HFA 108 (90 BASE) MCG/ACT IN AERS: 1.0000 | INHALATION_SPRAY | Freq: Four times a day (QID) | RESPIRATORY_TRACT | Status: DC | PRN

## 2022-12-07 MED ORDER — ALBUTEROL SULFATE (2.5 MG/3ML) 0.083% IN NEBU
2.5000 mg | INHALATION_SOLUTION | Freq: Four times a day (QID) | RESPIRATORY_TRACT | Status: DC | PRN
  Administered 2022-12-07: 2.5 mg via RESPIRATORY_TRACT
  Filled 2022-12-07: qty 3

## 2022-12-07 NOTE — Progress Notes (Signed)
[facility] Progress Note   Subjective:   no new c/o's.  Looks like assessment feels it would be best for SNF-  she does not understand-  doesn't realize that she was being assessed   Objective Vitals:   12/06/22 2134 12/07/22 0446 12/07/22 0453 12/07/22 0748  BP: (!) 180/98  (!) 163/133 (!) 176/160  Pulse: 76  79 78  Resp: '16  16 19  '$ Temp: 98.5 F (36.9 C)  98.1 F (36.7 C) 98.6 F (37 C)  TempSrc: Oral   Oral  SpO2: 100%  99% 97%  Weight:  56 kg    Height:       Physical Exam General: Elderly female, alert and in NAD Heart: RRR, no murmurs, rubs or gallops Lungs: CTA bilaterally, respirations unlabored on RA Abdomen: Soft, non-distended, +BS Extremities: No edema b/l lower extremities Dialysis Access: LUE AVF + Bruit  Additional Objective Labs: Basic Metabolic Panel: Recent Labs  Lab 12/05/22 0141 12/06/22 0219 12/07/22 0344  NA 135 133* 135  K 4.2 3.4* 5.0  CL 93* 90* 94*  CO2 '26 27 27  '$ GLUCOSE 88 112* 98  BUN 35* 24* 36*  CREATININE 6.68* 4.79* 6.40*  CALCIUM 9.5 9.6 9.6  PHOS 5.8* 4.0 4.6   Liver Function Tests: Recent Labs  Lab 12/05/22 0141 12/06/22 0219 12/07/22 0344  ALBUMIN 2.3* 2.4* 2.3*   No results for input(s): "LIPASE", "AMYLASE" in the last 168 hours. CBC: Recent Labs  Lab 12/02/22 0540 12/03/22 0147 12/04/22 0345 12/05/22 0141 12/06/22 0219 12/07/22 0344  WBC 3.6*  3.6* 4.6 4.6 4.0 2.6* 2.5*  NEUTROABS 2.1 2.9  --   --   --   --   HGB 8.7*  8.5* 8.8* 9.1* 8.9* 9.3* 8.9*  HCT 25.8*  25.9* 25.6* 26.0* 26.1* 27.7* 27.4*  MCV 100.8*  101.2* 98.5 97.7 99.2 99.3 100.7*  PLT 141*  138* 152 148* 137* 162 167   Blood Culture    Component Value Date/Time   SDES URINE, CLEAN CATCH 12/01/2022 1114   SPECREQUEST NONE 12/01/2022 1114   CULT  12/01/2022 1114    NO GROWTH Performed at [facility], [place] [address], [place], [place] [address]    REPTSTATUS 12/02/2022 FINAL 12/01/2022 1114    Cardiac  Enzymes: No results for input(s): "CKTOTAL", "CKMB", "CKMBINDEX", "TROPONINI" in the last 168 hours. CBG: Recent Labs  Lab 12/05/22 2155  GLUCAP 112*   Iron Studies: No results for input(s): "IRON", "TIBC", "TRANSFERRIN", "FERRITIN" in the last 72 hours. '@lablastinr3'$ @ Studies/Results: No results found. Medications:   apixaban  2.5 mg Oral BID   Chlorhexidine Gluconate Cloth  6 each Topical Q0600   Chlorhexidine Gluconate Cloth  6 each Topical Q0600   Chlorhexidine Gluconate Cloth  6 each Topical Q0600   darbepoetin (ARANESP) injection - DIALYSIS  100 mcg Subcutaneous Q Fri-1800   hydrALAZINE  100 mg Oral 2 times per day on Tue Thu Sat   metoprolol tartrate  50 mg Oral BID   pantoprazole  40 mg Oral Daily   sevelamer carbonate  1,600 mg Oral TID WC   sulfamethoxazole-trimethoprim  1 tablet Oral QHS    Dialysis Orders: [place] [facility]  4h  400/600   53.6kg  2/2 bath  Heparin none LUA AVF - last HD 12/23 , post wt 53.7kg - venofer 50 q wk - hectorol 2 mcg IV tiw - mircera 100 mcg q2, last 11/18 - last Hb 10.4 on 12/19  Assessment/Plan: Back pain / fever -  temp  101- . Hx septic discitis multiple rounds in 2023. MRI here showed unchanged osteo at L2-L3, L4-5, possibly slight worsening. Started on IV vanc/ cefepime. Blood cx's neg, and is now afebrile. Plan is to d/c on bactrim for suppressive therapy ESRD - on HD TTS. Had been off schedule this past week. Had HD Friday,  but required restraints d/t agitation. HD Sunday went well , plan to  resume TTS schedule-  so next HD will be today -   HTN/ vol - BP's good, no vol excess on exam. Getting under EDW-  EDW will need to be adjusted down at discharge-  BP actually high on hydralazine and metoprolol-   will re assess post HD  Anemia esrd - Hb 8.9. Last OP esa was given 11/18.resumed esa w/ darbe 100 ug weekly q Friday.  MBD ckd - CCa is a bit high, holding IV vdra for now. Phos good,  cont renvela as binder.   Hep C - per pmd Atrial fib  - on eliquis, holding BB for low HR.  Mgt per cardiology Dispo-  is now appearing will need placement   Louis Meckel  12/07/2022, 8:19 AM  [employer]

## 2022-12-07 NOTE — Progress Notes (Addendum)
11:50am: Per renal navigator, patient will receive HD treatment tomorrow and can be discharged to [facility] after.  CSW notified Freda Munro at [employer] of information. Freda Munro will coordinate with patient's son Marchia Bond to complete paperwork and will inform Olivia Mackie of [facility] of information to coordinate transportation for pickup after 12pm tomorrow.    11am: CSW spoke with Olivia Mackie, renal navigator who states a decision should be made today by the outpatient HD center on whether or not the patient can be accepted into the center. Once a plan is finalized, patient can discharge to [facility]  CSW informed Lexine Baton at [employer] of information.  [name], MSW, LCSW Transitions of Care  Clinical Social Worker II [phone]

## 2022-12-07 NOTE — Care Management Important Message (Signed)
Important Message  Patient Details  Name: Diamond [name] MRN: [id number] Date of Birth: [birth year]   Medicare Important Message Given:  Yes   Patient has a precaution Order in Place she advised me to sent it to her home address.   Earlene Bjelland 12/07/2022, 1:51 PM

## 2022-12-07 NOTE — Discharge Summary (Signed)
Name: Diamond [name] MRN: [id number] DOB: 1951-05-31 72 y.o. PCP: [facility] [place]  Date of Admission: 11/29/2022  8:04 PM Date of Discharge: 12/08/2022 10:12 AM Attending Physician: Dr.  Cain Sieve   Discharge Diagnosis: Principal Problem:   Altered mental status Active Problems:   Discitis of lumbar region   ESRD on dialysis [facility])   AMS (altered mental status)   Fever in adult   Acute cough    Discharge Medications: Allergies as of 12/08/2022       Reactions   Shrimp (diagnostic) Other (See Comments)   Unknown reaction   Other Swelling, Rash   Anesthesia - facial swelling, rash        Medication List     STOP taking these medications    losartan 25 MG tablet Commonly known as: COZAAR   metoprolol tartrate 50 MG tablet Commonly known as: LOPRESSOR   vancomycin  IVPB       TAKE these medications    acetaminophen 325 MG tablet Commonly known as: TYLENOL Take 2 tablets (650 mg total) by mouth 4 (four) times daily. What changed:  when to take this reasons to take this   albuterol 108 (90 Base) MCG/ACT inhaler Commonly known as: VENTOLIN HFA Inhale 2 puffs into the lungs every 4 (four) hours as needed for wheezing or shortness of breath.   apixaban 2.5 MG Tabs tablet Commonly known as: ELIQUIS Take 1 tablet (2.5 mg total) by mouth 2 (two) times daily.   cloNIDine 0.1 MG tablet Commonly known as: CATAPRES Take 0.1 mg by mouth every 6 (six) hours as needed (SBP > 170).   fluticasone-salmeterol 250-50 MCG/ACT Aepb Commonly known as: Advair Diskus Inhale 1 puff into the lungs in the morning and at bedtime.   gabapentin 100 MG capsule Commonly known as: NEURONTIN Take 1 capsule (100 mg total) by mouth at bedtime.   hydrALAZINE 100 MG tablet Commonly known as: APRESOLINE Take 1 tablet by mouth 3 times a day every Monday Wednesday Friday and Sunday Take 1 tablet by mouth 2 times a day every Tuesday Thursday and  Saturday What changed:  how much to take how to take this when to take this additional instructions   metoprolol succinate 25 MG 24 hr tablet Commonly known as: Toprol XL Take 2 tablets (50 mg total) by mouth daily.   MIRCERA IJ Mircera   pantoprazole 40 MG tablet Commonly known as: PROTONIX Take 1 tablet (40 mg total) by mouth daily.   sevelamer 800 MG tablet Commonly known as: RENAGEL Take 2 tablets by mouth 3 times a day every Tuesday Thursday and Saturday Give 2 tablets by mouth with meals every Monday Wednesday Friday and Sunday What changed:  how much to take how to take this when to take this additional instructions   sevelamer carbonate 0.8 g Pack packet Commonly known as: RENVELA Take 0.8 g by mouth with breakfast, with lunch, and with evening meal.   sulfamethoxazole-trimethoprim 400-80 MG tablet Commonly known as: BACTRIM Take 1 tablet by mouth at bedtime.       ASK your doctor about these medications    mupirocin ointment 2 % Commonly known as: BACTROBAN Place 1 Application into the nose 2 (two) times daily for 2 days. Ask about: Should I take this medication?        Disposition and follow-up:   [name] was discharged from [facility] in Stable condition.  At the hospital follow up visit  please address:  1.  Follow-up:  *Lumbar perivertebral inflammation *Hx of lumbar discitis - Continue on Bactrim 400-80 mg tablet daily - Ensure outpatient follow up with infectious disease on 01/05/2023 at 3:45 PM  *ESRD on HD *MBD CKD *Anemia of chronic disease -Resume outpatient HD scheduling -Resume ESA weekly at dialysis -Monitor Ca, phos  *HTN -Assess volume status -Manage antihypertensives with   *Chronic Hepatitis C -Patient is to follow up in ID for management   2.  Labs / imaging needed at time of follow-up: CBC, BMP, Ca, and phos  3.  Pending labs/ test needing follow-up: None   4.  Medication Changes:  None   Follow-up Appointments:  Contact information for follow-up providers     [facility], [place]. Go on 12/09/2022.   Why: Schedule is Tuesday/Thursday/Saturday with 12:10 chair time.  Please arrive at 11:30 am on Thursday to complete paperwork prior to treatment. Contact information: [place] [address] [phone]              Contact information for after-discharge care     Destination     [facility] Preferred SNF .   Service: Skilled Nursing Contact information: [address] [place] [phone]                    Infectious disease  appointment with Dr. Gale Journey 01/05/2023 3:45 PM (Arrive by 3:30 PM) Jabier Mutton, MD 01/05/2023 3:45 PM (Arrive by 3:30 PM) [facility] [address] [place],  [place]  [id number]   Hospital Course by problem list: AMS, resolved Fever of unknown source, resolved Patient presented with AMS, resolved in the ED, and fever on admission. Given history of recurrent bacteremia 2/2 lumbar spine osteomyelitis and worsening lumbar pain in the past 2-3 weeks, MRI lumbar spine was obtained. No evidence of worsening osteomyelitis at L2-L3, L4-5; however, worsening paravertebral inflammatory changes concerning for infection. No leukocytosis since admission. ESR and CRP elevated to 75 snf 16.2, respectively. U/A clean. Blood and urine cultures sent, no growth by discharge. Patient was started on Vancomycin and cefepime on 12/25. ID was consulted on 12/27.  Vancomycin and cefepime discontinued. Patient remained afebrile and without a leukocytosis. Did develop some low back pain that improved significantly by discharge. Per infectious disease, patient is to continue bactrim suppression and outpatient follow up.  AMS, resolved Fever of unknown source, resolved Please follow up symptoms to ensure no return of fever, systemic signs of  infection, or signs of myelopathy. Please ensure patient remains on daily bactrim until f/u with infectious disease, and that this appointment materializes.  ESRD, HD on TTS MBD CKD Anemia of chronic disease Underwent dialysis 12/08/2022. Patient is to resume scheduled HD as outpatient. Hb 8.9 at discharge. On aranesp and ESA weekly. Continue on Renvela as binder  Hypertension Hypertensive during this admission, especially on HD days. Will discharge with hydralazine 100 mg twice daily on dialysis days and three times daily on non-dialysis days. Will also resume home clonidine for systolic BP >270.  Stable medical conditions  Atrial fibrillation - Remained rate controlled during this hospitalization. Will discharge on home Eliquis and metoprolol.   Chronic Hepatitis C Patient is to follow up with infectious disease at outpatient for management   Physical therapy needs: Patient is in need of: - A lot of help with walking and/or transfers - A lot of help with bathing/dressing/bathroom - Assistance  with cooking/housework - Direct supervision/assist for medications management - Direct supervision/assist for financial management - Assist for transportation - Help with stairs or ramp for entrance   General bed mobility comments: min-mod assist for log roll technique to/from EOB, pt able to elevate trunk off of bed without PT assist and with use of bedrails   Precautions / Restrictions Precautions: Fall Weight Bearing Restrictions: No    Discharge Subjective: Feeling better after HD this AM. No pain, nausea, vomiting, or congestion. Slept well and is excited to move to Rehab.  Discussed Bactrim daily moving forward until she sees infectious disease   Discharge Exam:   Blood pressure (!) 167/99, pulse 71, temperature 98.3 F (36.8 C), temperature source Oral, resp. rate 19, height _0  (1.676 m), weight 56 kg, SpO2 100 %.  Constitutional:Chronically ill-appearing woman sitting in bed, in  no acute distress HENT: normocephalic atraumatic, mucous membranes moist Cardiovascular: irregular rhythm, no m/r/g, LUE fistula without erythema - palpable thrill Pulmonary/Chest: normal work of breathing on room air, lungs clear to auscultation bilaterally. No crackles  Abdominal: soft, non-tender, non-distended.  Neurological: alert & oriented x 3 MSK: no gross abnormalities. No pitting edema. No tenderness on lumbar paravertebral muscles Skin: warm and dry Psych: Normal mood and affect  Pertinent Labs, Studies, and Procedures:     Latest Ref Rng & Units 12/07/2022    3:44 AM 12/06/2022    2:19 AM 12/05/2022    1:41 AM  CBC  WBC 4.0 - 10.5 K/uL 2.5  2.6  4.0   Hemoglobin 12.0 - 15.0 g/dL 8.9  9.3  8.9   Hematocrit 36.0 - 46.0 % 27.4  27.7  26.1   Platelets 150 - 400 K/uL 167  162  137        Latest Ref Rng & Units 12/07/2022    3:44 AM 12/06/2022    2:19 AM 12/05/2022    1:41 AM  CMP  Glucose 70 - 99 mg/dL 98  112  88   BUN 8 - 23 mg/dL 36  24  35   Creatinine 0.44 - 1.00 mg/dL 6.40  4.79  6.68   Sodium 135 - 145 mmol/L 135  133  135   Potassium 3.5 - 5.1 mmol/L 5.0  3.4  4.2   Chloride 98 - 111 mmol/L 94  90  93   CO2 22 - 32 mmol/L _1 Calcium 8.9 - 10.3 mg/dL 9.6  9.6  9.5     MR LUMBAR SPINE WO CONTRAST  Result Date: 11/30/2022 CLINICAL DATA:  Worsening low back pain over the past 2 weeks. History of osteomyelitis discitis. EXAM: MRI LUMBAR SPINE WITHOUT CONTRAST TECHNIQUE: Multiplanar, multisequence MR imaging of the lumbar spine was performed. No intravenous contrast was administered. COMPARISON:  MRI lumbar spine dated September 17, 2022. FINDINGS: Segmentation: Unchanged transitional lumbosacral anatomy with sacralization of L5. Alignment:  Unchanged trace anterolisthesis at L1-L2 and L3-L4. Vertebrae: Similar degree of endplate irregularity, marrow edema, and decreased T1 signal at L2-L3 and L4-L5. Diffusely decreased L3-T1 marrow signal is also unchanged.  Slightly decreased amount of intradiscal fluid at L2-L3 and L4-L5. Chronic L3 compression deformities unchanged. No acute fracture or suspicious bone lesion. Conus medullaris and cauda equina: Conus extends to the L1 level. Similar degree of anterior epidural phlegmon at L3. Paraspinal and other soft tissues: Worsened paravertebral inflammatory changes from L2-L4, most notable anteriorly where there is increased soft tissue thickening between the vertebral bodies and aorta/IVC. No drainable  fluid collection. Multiple bilateral renal simple cysts again noted. No follow-up imaging is recommended. Disc levels: T12-L1:  Negative. L1-L2: Unchanged mild disc bulging with superimposed small central disc protrusion. Unchanged mild bilateral facet arthropathy. Unchanged mild bilateral lateral recess stenosis. No spinal canal or neuroforaminal stenosis. L2-L3: Unchanged disc bulging and bilateral ligamentum flavum hypertrophy resulting in mild-to-moderate spinal canal and bilateral lateral recess stenosis. Unchanged mild bilateral neuroforaminal stenosis. L3-L4: Unchanged moderate disc bulging, bilateral facet arthropathy, and anterior epidural phlegmon resulting in moderate to severe spinal canal stenosis with mild right and moderate left neuroforaminal stenosis. L4-L5: Unchanged circumferential disc osteophyte complex with superimposed central disc protrusion. Unchanged moderate bilateral facet arthropathy. Unchanged moderate spinal canal and moderate to severe bilateral lateral recess stenosis. Unchanged mild-to-moderate bilateral neuroforaminal stenosis. L5-S1:  Transitional level.  Negative. IMPRESSION: 1. Unchanged findings of discitis-osteomyelitis at L2-L3 and L4-L5 with worsening paravertebral inflammatory changes. No paravertebral or epidural abscess. 2. Unchanged mild-to-moderate L2-L3, moderate to severe L3-L4, and moderate L4-L5 spinal canal stenosis. Electronically Signed   By: Titus Dubin M.D.   On:  11/30/2022 09:42   DG Chest 1 View  Result Date: 11/30/2022 CLINICAL DATA:  Fever.  Evaluate for infection EXAM: CHEST  1 VIEW COMPARISON:  10/04/2022 FINDINGS: Similar cardiomegaly given differences in technique and patient rotation. No focal consolidation, pleural effusion, or pneumothorax. No acute osseous abnormality. Irregular density projecting over the left mid lung corresponds to dense breast calcification seen on prior imaging. No displaced rib fractures. IMPRESSION: No active disease.  Cardiomegaly. Electronically Signed   By: Placido Sou M.D.   On: 11/30/2022 03:00   CT Head Wo Contrast  Result Date: 11/30/2022 CLINICAL DATA:  Altered mental status EXAM: CT HEAD WITHOUT CONTRAST TECHNIQUE: Contiguous axial images were obtained from the base of the skull through the vertex without intravenous contrast. RADIATION DOSE REDUCTION: This exam was performed according to the departmental dose-optimization program which includes automated exposure control, adjustment of the mA and/or kV according to patient size and/or use of iterative reconstruction technique. COMPARISON:  10/05/2022 FINDINGS: Brain: Normal anatomic configuration. Parenchymal volume loss is commensurate with the patient's age. Stable extensive periventricular white matter changes are present likely reflecting the sequela of small vessel ischemia. Remote lacunar infarcts within the inferior right basal ganglia and left insular cortex. No abnormal intra or extra-axial mass lesion or fluid collection. No abnormal mass effect or midline shift. No evidence of acute intracranial hemorrhage or infarct. Ventricular size is normal. Cerebellum unremarkable. Vascular: No asymmetric hyperdense vasculature at the skull base. Skull: Intact Sinuses/Orbits: Paranasal sinuses are clear. Orbits are unremarkable. Other: Mastoid air cells and middle ear cavities are clear. IMPRESSION: 1. No acute intracranial hemorrhage or infarct. 2. Stable extensive  periventricular white matter changes likely reflecting the sequela of small vessel ischemia. 3. Remote lacunar infarcts within the right basal ganglia and left insular cortex. Electronically Signed   By: Fidela Salisbury M.D.   On: 11/30/2022 00:24     Discharge Instructions: Discharge Instructions     Call MD for:  difficulty breathing, headache or visual disturbances   Complete by: As directed    Call MD for:  persistant dizziness or light-headedness   Complete by: As directed    Call MD for:  persistant nausea and vomiting   Complete by: As directed    Call MD for:  severe uncontrolled pain   Complete by: As directed    Call MD for:  temperature >100.4   Complete by: As directed  Diet - low sodium heart healthy   Complete by: As directed    Discharge instructions   Complete by: As directed    Mrs. Shanker,  It was a pleasure caring for you during your admission here at [facility]. You came in with altered mental status and a fever. It is likely that your fever is related to an infection near your spine. Your fever and symptoms have resolved with antibiotics. Please note the following items: -follow up with infectious disease on 01/05/2023 -please schedule an appointment in the next week or so with your primary care provider -Take Bactrim 1 tablet daily until your follow-up with infectious disease -Stop taking losartan and Lopressor.  -Start taking metoprolol succinate (Toprol XL) 50 mg daily   Increase activity slowly   Complete by: As directed    No wound care   Complete by: As directed        Signed: Romana Juniper, MD [facility] - PGY1 Pager: [phone] 12/08/2022, 10:12 AM

## 2022-12-07 NOTE — Progress Notes (Addendum)
Hospital day#7 Subjective:   Summary: Diamond [name] is a 72 y.o. with a pertinent PMH of HTN, ESRD on dialysis TTS, atrial flutter, hx lumbar osteomylitis and MRSA bacteremia with infectious endocarditis admitted for AMS, now resolved,  and fever of unknown source with lumbar paravertebral inflammation on MRI, concerning for lumbar osteomyelitis   Overnight Events: None  Feeling better this am, though endorsing mild cough. NO URI symptoms, nausea, vomiting, shortness of breath. Continues to urinate. Feeling well otherwise, with improving back pain. Discussed discharge to [facility] as soon as she is able to get HD in house, and patient was in agreement  Objective:  Vital signs in last 24 hours: Vitals:   12/06/22 2134 12/07/22 0446 12/07/22 0453 12/07/22 0748  BP: (!) 180/98  (!) 163/133 (!) 176/160  Pulse: 76  79 78  Resp: _0 Temp: 98.5 F (36.9 C)  98.1 F (36.7 C) 98.6 F (37 C)  TempSrc: Oral   Oral  SpO2: 100%  99% 97%  Weight:  56 kg    Height:       Supplemental O2: Room Air SpO2: 97 % Filed Weights   12/05/22 1352 12/05/22 1655 12/07/22 0446  Weight: 51 kg 51 kg 56 kg    Physical Exam:  Constitutional: chronically ill-appearing woman sitting in bed, in no acute distress HENT: normocephalic atraumatic, moist mucous membranes Pulmonary/Chest: normal work of breathing on room air Neurological: alert & oriented x 3, moves extremities without problem Skin: warm and dry Psych: Normal mood and affect   Intake/Output Summary (Last 24 hours) at 12/07/2022 1526 Last data filed at 12/06/2022 1547 Gross per 24 hour  Intake 177 ml  Output --  Net 177 ml     Net IO Since Admission: -1,463 mL [12/07/22 1526]  Pertinent Labs:    Latest Ref Rng & Units 12/07/2022    3:44 AM 12/06/2022    2:19 AM 12/05/2022    1:41 AM  CBC  WBC 4.0 - 10.5 K/uL 2.5  2.6  4.0   Hemoglobin 12.0 - 15.0 g/dL 8.9  9.3  8.9   Hematocrit 36.0 - 46.0 % 27.4  27.7  26.1   Platelets  150 - 400 K/uL 167  162  137        Latest Ref Rng & Units 12/07/2022    3:44 AM 12/06/2022    2:19 AM 12/05/2022    1:41 AM  CMP  Glucose 70 - 99 mg/dL 98  112  88   BUN 8 - 23 mg/dL 36  24  35   Creatinine 0.44 - 1.00 mg/dL 6.40  4.79  6.68   Sodium 135 - 145 mmol/L 135  133  135   Potassium 3.5 - 5.1 mmol/L 5.0  3.4  4.2   Chloride 98 - 111 mmol/L 94  90  93   CO2 22 - 32 mmol/L _1 Calcium 8.9 - 10.3 mg/dL 9.6  9.6  9.5     Imaging: No results found.  Assessment/Plan:   Principal Problem:   Altered mental status Active Problems:   Discitis of lumbar region   ESRD on dialysis ([place])   AMS (altered mental status)   Fever in adult   Acute cough   Patient Summary: Diamond [name] is a 72 y.o. with a pertinent PMH of HTN, ESRD on dialysis TTS, atrial flutter, hx lumbar osteomylitis and MRSA bacteremia with infectious endocarditis who presented with  AMS, now resolved, and fever of unknown origin being treated for paravertebral inflammation with Bactrim, receiving HD tomorrow AM to then be discharged to [facility] for short term rehab  AMS, resolved Fever of unknown source Hx of recurrent bacteremia 2/2 lumbar spine osteomyelitis Hx of infective endocarditis with R atrial vegetation Patient followed by ID outpatient for lumbar osteomyelitis. Last vanc before admission at HD 11/15/2022. Worsening lumbar pain over past 2 weeks, none on admission. New flank pain 12/27 - see below. Initially with high suspicion for worsening osteomyelitis vs lumbar abscess. Lumbar MRI with unchanged osteomyelitis at L2-L3, L4-5 with worsening paravertebral inflammatory changes. ESR 75 and CRP 16.2 on 12/27. Started on vancomycin and cefepime. Lactic acid initially elevated to >9.0, since resolved.  Repeat ESR 82, CRP 12.  Remains afebrile today without leukocytosis. No growth on cultures. ID consulted for antibiotic management and recommended suppressive bactrim. Patient is awaiting HD at  hospital prior to discharge to be able to resume TTS schedule as outpatient -ID consulted, appreciate recommendations -suppressive bactrim 1 tablet daily until ID f/u -Pain management: Tylenol  ESRD HD on TTS Anemia Underwent HD 12/31. Tolerated well without restraints. Plans for HD tomorrow, after which patient will be ready for discharge -HD on 12/08/2022 in hospital -Continue Sevelamer 1600 mg TID -ESA and Aranesp weekly  HTN Hypertensive likely in the setting of delayed HD. Currently on hydralazine 100 mg BID on dialysis days, TID on non-dialysis days.  -home metoprolol tartrate 50 mg twice daily -home hydralazine  -Appreciate nephrology's management  Atrial flutter -Continue Eliquis 2.5 mg BID  Hx R atrial mass and MRSA bacteremia TEE 12/22/2021 with right atrial/IVC globular mobile mass consistent with thombous/vegetation. Resolved on repeat echocardiogram. Finished IV antibiotics for this on 02/13/2022. No cardiac symptoms today.   GERD  Protonix 40 mg   Bilateral low back pain with radiation anteriorly, resolved Patient endorsed bilateral flank pain 12/27 with radiation to her lower abdomen and suprapubic area. This has migrated anywhere from low back to CVA during admission but most recently this pain seems to localize to hips / lateral lumbar area. No nausea, vomiting, or chills. No red flag symptoms. Afebrile since admission. UA, urine culture clean. Feel this pain is most likely radicular in etiology. Pain has improved over the course of the last few days. -continue to monitor for worsening / change in symptoms  Diet: Renal with fluids VTE: On eliquis Code: Full PT/OT recs: pending Family Update: Attempted today, will try again tomorrow prior to discharge  Dispo: Anticipated discharge to  home  pending HD on 12/08/2022  Romana Juniper, MD 12/07/22 [facility] Internal Medicine Program - PGY-1

## 2022-12-07 NOTE — Progress Notes (Signed)
OT Cancellation Note  Patient Details Name: Diamond [name] MRN: [id number] DOB: 04-15-1951   Cancelled Treatment:    Reason Eval/Treat Not Completed: Fatigue/lethargy limiting ability to participate (Nursing states patient is lethargic and to attempt later today. Will attempt again later today as schedule permits) Lodema Hong, [place]  Office [place] 12/07/2022, 10:08 AM

## 2022-12-07 NOTE — Progress Notes (Addendum)
Pt has been accepted at [facility]) on TTS 12:10 chair time. Pt can start on Thursday and will need to arrive at 11:30 am to complete paperwork prior to treatment. Spoke to pt's son, Diamond [name], via phone to make him aware of the above details. Diamond [name] to have a family member meet pt at clinic on Thursday to assist with paperwork since pt has some confusion. Update provided to CSW, nephrologist, and renal NP. Will add arrangements to AVS as well. Will assist as needed.   Melven Sartorius Renal Navigator [phone]  Addendum at 12:00 pm: Pt is scheduled to receive inpt HD tomorrow due to HD census and staffing today. Pt will need to receive inpt HD then d/c to snf. Update provided to CSW due to transportation scheduling purposes. Contacted inpt HD unit to request that pt receive HD first shift tomorrow so pt can be transported to snf as soon as HD has been completed.

## 2022-12-08 DIAGNOSIS — R509 Fever, unspecified: Secondary | ICD-10-CM | POA: Diagnosis not present

## 2022-12-08 DIAGNOSIS — R404 Transient alteration of awareness: Secondary | ICD-10-CM | POA: Diagnosis not present

## 2022-12-08 DIAGNOSIS — N186 End stage renal disease: Secondary | ICD-10-CM | POA: Diagnosis not present

## 2022-12-08 DIAGNOSIS — M8668 Other chronic osteomyelitis, other site: Secondary | ICD-10-CM | POA: Diagnosis not present

## 2022-12-08 NOTE — Plan of Care (Addendum)
RN called report to [facility], to [name], RN called pt daughter charlene to make her aware

## 2022-12-08 NOTE — Progress Notes (Signed)
Occupational Therapy Treatment Patient Details Name: Diamond [name] MRN: [id number] DOB: [birth year] Today's Date: 12/08/2022   History of present illness Pt is a 72 y.o. female admitted 11/29/22 with AMS and fever of unknown source concerning for lumbar osteomyelitis vs abscess. MRI 12/26 with unchanged findings of discitis-osteomyelitis L2-L5; no abscess. Head CT negative for acute injury; remote lacunar infarcts in R basal ganglia and L insular cortex. Of note, recent admission 09/2022 with progression of lumbar discitis/osteomyelitis. Other PMH includes asthma, aflutter, ESRD (HD TTS), HTN, gout, MRSA bacteremia with infectious endocarditis.   OT comments  Patient received in supine and agreeable to OT session. Patient demonstrated gains this treatment session with patient participating in LB dressing seated on EOB but continues to require max assist due to assistance needed to thread legs into clothing and to pull up. Patient fearful when attempting to stand and was able to transfer to recliner with face to face technique.  Patient discharge recommendation for SNF continues to be appropriate for continued OT treatment to address functional transfers and self care.    Recommendations for follow up therapy are one component of a multi-disciplinary discharge planning process, led by the attending physician.  Recommendations may be updated based on patient status, additional functional criteria and insurance authorization.    Follow Up Recommendations  Skilled nursing-short term rehab (<3 hours/day)     Assistance Recommended at Discharge Frequent or constant Supervision/Assistance  Patient can return home with the following  A lot of help with walking and/or transfers;A lot of help with bathing/dressing/bathroom;Assistance with feeding;Assistance with cooking/housework;Direct supervision/assist for medications management;Direct supervision/assist for financial management;Assist for  transportation;Help with stairs or ramp for entrance   Equipment Recommendations  Other (comment) (defer)    Recommendations for Other Services      Precautions / Restrictions Precautions Precautions: Fall Precaution Comments: bladder/bowel incontinence Restrictions Weight Bearing Restrictions: No       Mobility Bed Mobility Overal bed mobility: Needs Assistance Bed Mobility: Rolling, Sidelying to Sit Rolling: Supervision Sidelying to sit: Mod assist       General bed mobility comments: assistance to raise trunk and to scoot forward on bed    Transfers Overall transfer level: Needs assistance Equipment used: None Transfers: Sit to/from Stand, Bed to chair/wheelchair/BSC Sit to Stand: Mod assist Stand pivot transfers: Mod assist         General transfer comment: Patient unable to stand from EOB to RW after several attempts. Patient able to perform transfer with face to face technique for stand pivot into recliner     Balance Overall balance assessment: Needs assistance Sitting-balance support: No upper extremity supported, Feet supported Sitting balance-Leahy Scale: Fair Sitting balance - Comments: patient often sitting with elbow resting on knees with hips flexed   Standing balance support: Bilateral upper extremity supported, During functional activity, Reliant on assistive device for balance Standing balance-Leahy Scale: Poor Standing balance comment: unable to stand with RW and required assistance from therapist for balance                           ADL either performed or assessed with clinical judgement   ADL Overall ADL's : Needs assistance/impaired     Grooming: Set up;Sitting;Wash/dry hands;Wash/dry face;Oral care               Lower Body Dressing: Maximal assistance;Sit to/from stand Lower Body Dressing Details (indicate cue type and reason): patient required assistance to thread legs into clothing and  to pull up while standing                General ADL Comments: frequent cues to stay on task and to follow directions    Extremity/Trunk Assessment              Vision       Perception     Praxis      Cognition Arousal/Alertness: Awake/alert Behavior During Therapy: Flat affect Overall Cognitive Status: No family/caregiver present to determine baseline cognitive functioning Area of Impairment: Attention, Memory, Following commands, Safety/judgement, Awareness, Problem solving                 Orientation Level: Place, Time, Situation Current Attention Level: Sustained   Following Commands: Follows one step commands inconsistently, Follows one step commands with increased time Safety/Judgement: Decreased awareness of deficits, Decreased awareness of safety Awareness: Intellectual Problem Solving: Slow processing, Requires verbal cues, Requires tactile cues, Decreased initiation, Difficulty sequencing General Comments: increased time to follow directions, states she is fearful of falling        Exercises      Shoulder Instructions       General Comments      Pertinent Vitals/ Pain       Pain Assessment Pain Assessment: Faces Faces Pain Scale: Hurts a little bit Pain Location: back Pain Descriptors / Indicators: Discomfort Pain Intervention(s): Monitored during session, Repositioned  Home Living                                          Prior Functioning/Environment              Frequency  Min 2X/week        Progress Toward Goals  OT Goals(current goals can now be found in the care plan section)  Progress towards OT goals: Progressing toward goals  Acute Rehab OT Goals Patient Stated Goal: get better OT Goal Formulation: With patient Potential to Achieve Goals: Good ADL Goals Pt Will Perform Grooming: standing;with min guard assist Pt Will Perform Lower Body Dressing: with min assist;sit to/from stand Pt Will Perform Toileting - Clothing  Manipulation and hygiene: with mod assist;sit to/from stand Additional ADL Goal #1: pt will complete bed mobility with min A as a precursor to ADLs  Plan Discharge plan remains appropriate    Co-evaluation                 AM-PAC OT "6 Clicks" Daily Activity     Outcome Measure   Help from another person eating meals?: A Little Help from another person taking care of personal grooming?: A Little Help from another person toileting, which includes using toliet, bedpan, or urinal?: A Lot Help from another person bathing (including washing, rinsing, drying)?: A Lot Help from another person to put on and taking off regular upper body clothing?: A Little Help from another person to put on and taking off regular lower body clothing?: A Lot 6 Click Score: 15    End of Session Equipment Utilized During Treatment: Rolling walker (2 wheels)  OT Visit Diagnosis: Unsteadiness on feet (R26.81);Other abnormalities of gait and mobility (R26.89);Muscle weakness (generalized) (M62.81);Pain   Activity Tolerance Patient tolerated treatment well   Patient Left in chair;with call bell/phone within reach;with chair alarm set   Nurse Communication Mobility status        Time: 9892-1194 OT Time Calculation (min): 33  min  Charges: OT General Charges $OT Visit: 1 Visit OT Treatments $Self Care/Home Management : 23-37 mins  Lodema Hong, [place]  Office [place] 12/08/2022, 1:46 PM

## 2022-12-08 NOTE — Progress Notes (Signed)
Pt to d/c to snf today. Contacted [facility] to advise clinic of pt's d/c today and that pt should start at clinic tomorrow as planned. Arrangements added to AVS. Renal NP advised of clinic's need for orders.   Melven Sartorius Renal Navigator [phone]

## 2022-12-08 NOTE — Progress Notes (Signed)
   12/08/22 0015  Vitals  Temp 98.2 F (36.8 C)  Temp Source Oral  BP 97/81  MAP (mmHg) 86  BP Location Right Arm  BP Method Automatic  Patient Position (if appropriate) Lying  Pulse Rate 98  Pulse Rate Source Monitor  Resp 17  Oxygen Therapy  SpO2 100 %  O2 Device Room Air  During Treatment Monitoring  Blood Flow Rate (mL/min) 350 mL/min  Arterial Pressure (mmHg) -150 mmHg  Venous Pressure (mmHg) 190 mmHg  TMP (mmHg) 3 mmHg  Ultrafiltration Rate (mL/min) 745 mL/min  Dialysate Flow Rate (mL/min) 300 ml/min  HD Safety Checks Performed Yes  Intra-Hemodialysis Comments Tx completed  Post Treatment  Dialyzer Clearance Lightly streaked  Duration of HD Treatment -hour(s) 3.5 hour(s)  Liters Processed 73.2  Fluid Removed (mL) 2 mL  Tolerated HD Treatment Yes  Post-Hemodialysis Comments tx complete, goal met, pt restless throughout tx, requiring frequent redirection and reorientation by staff  Fistula / Graft Left Forearm Arteriovenous fistula  Placement Date/Time: (c) 12/23/21 1646   Placed prior to admission: No  Orientation: Left  Access Location: Forearm  Access Type: Arteriovenous fistula  Site Condition No complications  Fistula / Graft Assessment Present;Bruit;Thrill  Status Deaccessed

## 2022-12-08 NOTE — Progress Notes (Signed)
[facility] Progress Note   Subjective:   Last HD on 1/2 and had 64m charted and was listed as having met the goal of 2 kg.  Appears error in charting.  She is hoping to go home soon.      Review of systems:  Denies n/v Denies shortness of breath or chest pain    Objective Vitals:   12/08/22 0000 12/08/22 0015 12/08/22 0024 12/08/22 0500  BP: 130/87 97/81    Pulse: 84 98    Resp: 18 17    Temp:  98.2 F (36.8 C)    TempSrc:  Oral    SpO2: 100% 100%    Weight:   50.3 kg 52.6 kg  Height:       Physical Exam  General: Elderly female, in bed and in NAD HEENT - NCAT Heart: S1S2 no rubs Lungs: CTA bilaterally, respirations unlabored on room air Abdomen: Soft, non-distended, nontender Extremities: No edema b/l lower extremities Psych no anxiety or agitation  Neuro - conversant and follows commands Dialysis Access: LUE AVF + Bruit and thrill   Additional Objective Labs: Basic Metabolic Panel: Recent Labs  Lab 12/05/22 0141 12/06/22 0219 12/07/22 0344  NA 135 133* 135  K 4.2 3.4* 5.0  CL 93* 90* 94*  CO2 _0 GLUCOSE 88 112* 98  BUN 35* 24* 36*  CREATININE 6.68* 4.79* 6.40*  CALCIUM 9.5 9.6 9.6  PHOS 5.8* 4.0 4.6   Liver Function Tests: Recent Labs  Lab 12/05/22 0141 12/06/22 0219 12/07/22 0344  ALBUMIN 2.3* 2.4* 2.3*   No results for input(s): "LIPASE", "AMYLASE" in the last 168 hours. CBC: Recent Labs  Lab 12/02/22 0540 12/03/22 0147 12/04/22 0345 12/05/22 0141 12/06/22 0219 12/07/22 0344  WBC 3.6*  3.6* 4.6 4.6 4.0 2.6* 2.5*  NEUTROABS 2.1 2.9  --   --   --   --   HGB 8.7*  8.5* 8.8* 9.1* 8.9* 9.3* 8.9*  HCT 25.8*  25.9* 25.6* 26.0* 26.1* 27.7* 27.4*  MCV 100.8*  101.2* 98.5 97.7 99.2 99.3 100.7*  PLT 141*  138* 152 148* 137* 162 167   Blood Culture    Component Value Date/Time   SDES URINE, CLEAN CATCH 12/01/2022 1114   SPECREQUEST NONE 12/01/2022 1114   CULT  12/01/2022 1114    NO GROWTH [facility] [address] [place] [address]   REPTSTATUS 12/02/2022 FINAL 12/01/2022 1114    Cardiac Enzymes: No results for input(s): "CKTOTAL", "CKMB", "CKMBINDEX", "TROPONINI" in the last 168 hours. CBG: Recent Labs  Lab 12/05/22 2155  GLUCAP 112*   Iron Studies: No results for input(s): "IRON", "TIBC", "TRANSFERRIN", "FERRITIN" in the last 72 hours. _1 @ Studies/Results: No results found. Medications:   apixaban  2.5 mg Oral BID   Chlorhexidine Gluconate Cloth  6 each Topical Q0600   Chlorhexidine Gluconate Cloth  6 each Topical Q0600   Chlorhexidine Gluconate Cloth  6 each Topical Q0600   darbepoetin (ARANESP) injection - DIALYSIS  100 mcg Subcutaneous Q Fri-1800   hydrALAZINE  100 mg Oral 2 times per day on Tue Thu Sat   metoprolol tartrate  50 mg Oral BID   pantoprazole  40 mg Oral Daily   sevelamer carbonate  1,600 mg Oral TID WC   sulfamethoxazole-trimethoprim  1 tablet Oral QHS    Dialysis Orders: [facility]  4h  400/600   53.6kg  2/2 bath  Heparin none LUA AVF - last HD 12/23 ,  post wt 53.7kg - venofer 50 q wk - hectorol 2 mcg IV tiw - mircera 100 mcg q2, last 11/18 - last Hb 10.4 on 12/19  Assessment/Plan: Back pain / fever - temp  101- . Hx septic discitis multiple rounds in 2023. MRI here showed unchanged osteo at L2-L3, L4-5, possibly slight worsening.  Antibiotics per primary team - has most recently been on bactrim.  Blood cx's neg from 11/29/22.  per charting plan is to d/c on bactrim for suppressive therapy ESRD - on HD TTS schedule.  Per renal navigator note she was accepted to [facility]) on TTS 2nd shift  HTN/ vol - BP's good, no vol excess on exam. Getting under [place] will need to be adjusted down at discharge.  BP improved with HD Anemia esrd - on aranesp 100 mcg weekly q Friday - first dose 12/03/22.  Hadn't gotten since 11/18 as an outpatient  MBD ckd - CCa is a bit high, so holding IV vdra for now. Phos controlled on  renvela Hep C - per primary team  Atrial fib - per primary team and cardiology.  on eliquis   Disposition per primary team discretion.  Per primary team charting she is for discharge to home   Claudia Desanctis, MD 12/08/2022, 7:05 AM [place]

## 2022-12-08 NOTE — Progress Notes (Addendum)
Patient will discharge to [facility] today at 12pm via [place]. The number to call for report is [phone]. Patient will go to room 204.  [name], MSW, LCSW Transitions of Care  Clinical Social Worker II [phone]

## 2022-12-08 NOTE — Progress Notes (Signed)
Mobility Specialist Progress Note:   12/08/22 1401  Mobility  Activity Transferred from bed to chair  Level of Assistance +2 (takes two people)  Assistive Device Other (Comment) (HHA)  Activity Response Tolerated well  Mobility Referral Yes  $Mobility charge 1 Mobility   Pt received in bed and RN requesting assistance in transfer of patient to wheelchair. No complaints. Pt left in chair with all needs met and RN in room.     Mobility Specialist Please contact via SecureChat or  Rehab office at [phone]  

## 2022-12-08 NOTE — Discharge Instructions (Signed)
Ms. Schuknecht, Hospital Course: You were admitted to the hospital for fever and confusion. We did a series of imaging tests, and found that there was inflammation in your lower back. Because you have a significant history of recurrent infections, we consulted the infectious disease doctors, and they started you on an antibiotic called Bactrim, to be taken once daily. Please continue taking this antibiotic until you see the infectious disease doctors. You will need to pick up this medication at your pharmacy.  Jabier Mutton, MD 01/05/2023 3:45 PM (Arrive by 3:30 PM) [facility] [address] [place],  [place]  [address]   Continue taking your medications as you were doing prior to this hospital admission and doing hemodialysis.  It was a pleasure taking care of you,  Romana Juniper, MD

## 2023-01-05 ENCOUNTER — Ambulatory Visit (INDEPENDENT_AMBULATORY_CARE_PROVIDER_SITE_OTHER): Payer: Medicare (Managed Care) | Admitting: Internal Medicine

## 2023-01-05 ENCOUNTER — Other Ambulatory Visit: Payer: Self-pay

## 2023-01-05 VITALS — BP 138/75 | HR 77 | Temp 98.0°F

## 2023-01-05 DIAGNOSIS — N186 End stage renal disease: Secondary | ICD-10-CM

## 2023-01-05 DIAGNOSIS — R7881 Bacteremia: Secondary | ICD-10-CM

## 2023-01-05 DIAGNOSIS — M4620 Osteomyelitis of vertebra, site unspecified: Secondary | ICD-10-CM | POA: Diagnosis not present

## 2023-01-05 DIAGNOSIS — B182 Chronic viral hepatitis C: Secondary | ICD-10-CM | POA: Diagnosis not present

## 2023-01-05 DIAGNOSIS — B9562 Methicillin resistant Staphylococcus aureus infection as the cause of diseases classified elsewhere: Secondary | ICD-10-CM

## 2023-01-05 NOTE — Progress Notes (Signed)
[facility] for [facility]ious Disease  Patient Active Problem List   Diagnosis Date Noted   Chronic osteomyelitis of lumbar spine ([place]) 12/08/2022   Acute cough 12/07/2022   Fever in adult 12/02/2022   Altered mental status 11/30/2022   AMS (altered mental status) 11/30/2022   PAF (paroxysmal atrial fibrillation) (HCC)    Thyroid nodule 09/17/2022   Empty sella ([place]) 09/17/2022   Discitis of lumbosacral region 07/26/2022   Hypertensive urgency 07/21/2022   Bacteremia 06/30/2022   Malnutrition of moderate degree 06/22/2022   Physical deconditioning 06/18/2022   Pressure injury of skin 06/16/2022   MRSA bacteremia 06/13/2022   ESRD on dialysis [facility])    Paroxysmal atrial flutter (HCC)    Acute metabolic encephalopathy    Acute on chronic anemia with positive fecal occult  02/05/2022   Multiple polyps of sigmoid colon    Heme positive stool    Duodenitis    Candida esophagitis ([place])    Gallbladder mass    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  01/16/2022   Discitis of lumbar region 01/16/2022   Hepatitis C without hepatic coma 01/15/2022   Sepsis due to methicillin resistant Staphylococcus aureus ([place]) 12/31/2021   Atrial flutter ([place]) 12/25/2021   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 seen on TEE on 1/17 12/24/2021   Lung nodule seen on imaging study 12/23/2021   Vertebral osteomyelitis ([place])    Sepsis due to methicillin susceptible Staphylococcus aureus ([place]) 12/17/2021   Lower back pain 12/17/2021   ESRD on hemodialysis ([place]) 12/17/2021   Encounter for screening for other viral diseases 11/03/2021   Allergy, unspecified, initial encounter 09/14/2021   Coagulation defect, unspecified ([place]) 85/01/7740   Complication of vascular dialysis catheter 09/14/2021   Gout due to renal impairment, right ankle and foot 09/14/2021   Iron deficiency anemia, unspecified 09/14/2021   Pain, unspecified 09/14/2021   Pruritus,  unspecified 09/14/2021   Secondary hyperparathyroidism of renal origin ([place]) 09/14/2021   Shortness of breath 09/14/2021   Unspecified protein-calorie malnutrition ([place]) 09/14/2021   Vitamin D deficiency 01/02/2021   Hyperparathyroidism ([place]) 12/31/2020   CKD (chronic kidney disease), stage III ([place]) 11/10/2019   Cardiomegaly 11/05/2019   Hyperkalemia 11/05/2019   Hypokalemia 10/18/2019   Pneumonia due to COVID-19 virus 10/17/2019   Hypertension    Asthma    Gout    Hyponatremia    Multifactorial anemia-acute blood loss from bleeding HD catheter site-superimposed on anemia related to ESRD.       Subjective:    Patient ID: Diamond [name], female    DOB: 04/09/1951, 72 y.o.   MRN: [id number]  No chief complaint on file.   HPI:  Diamond [name] is a 72 y.o. female esrd, recurrent mrsa bacteremia, recent finished 4 weeks iv cefazolin 07/13/22 for mrsa bacteremia then readmitted 2 weeks later 8/22 for acute on chronic back pain with mri concerning for l2-3 paravertebral edema and still elevated crp, s/p 8 weeks vancomycin by 12/11, here for f/u  01/05/23 id clinic visit: Patient got readmitted in the hospital 12/25-1/03 for fever. Negative w/u. Mri lumbar spine showed no new abscess but only evolving changes. Blood cx negative Only had fever x1 episode Crp was elevated but stable  ID team saw during this recent admission as well. She receive bactrim renal dosing oral course and is here for f/u   No n/v/diarrhea/f/c/rash  Back pain not there until she gets up and start moving around. At maximum 7/10.  This is most recent since she got out of rehab. During the midst of infection, the pain was very bad and constant   She feels a "band tightness" around lower chest wall last 2 days. She has been walking with a FWW. She is recently out of snf and back home a week prior to this visit  No cough  She thinks she takes bactrim 1 ss tablet daily but not sure. Her daughter prescribes  it.      Prior id clinic visits ----------------- 09/15/22 id clinic f/u She finished bactrim a couple weeks ago she think but not sure Pain same as last visit No worsening appetite No f/c No numbness/tingling No urinary incontinence  10/15/22 id clinic f/u Patient had fever and found to have recurrent mrsa bacteremia and also mri lumbar spine progression of l2-4 vertebral om and ventral epidural phlegmon 10/13 bcx mrsa (R tetra; sensitive to bactrim) 10/16 bcx ngtd She was admitted to [place] and placed on 8 weeks vancomycin with dialysis until planned 11/15/22 She has moderate back pain still but no fever chill. Same as 09/2022 No malaise/f/c No diarrhea       Allergies  Allergen Reactions   Shrimp (Diagnostic) Other (See Comments)    Unknown reaction   Other Swelling and Rash    Anesthesia - facial swelling, rash      Outpatient Medications Prior to Visit  Medication Sig Dispense Refill   acetaminophen (TYLENOL) 325 MG tablet Take 2 tablets (650 mg total) by mouth 4 (four) times daily. (Patient taking differently: Take 650 mg by mouth 4 (four) times daily as needed for moderate pain.)     albuterol (VENTOLIN HFA) 108 (90 Base) MCG/ACT inhaler Inhale 2 puffs into the lungs every 4 (four) hours as needed for wheezing or shortness of breath. 6.7 g 0   apixaban (ELIQUIS) 2.5 MG TABS tablet Take 1 tablet (2.5 mg total) by mouth 2 (two) times daily. 60 tablet 0   cloNIDine (CATAPRES) 0.1 MG tablet Take 0.1 mg by mouth every 6 (six) hours as needed (SBP > 170).     fluticasone-salmeterol (ADVAIR DISKUS) 250-50 MCG/ACT AEPB Inhale 1 puff into the lungs in the morning and at bedtime. 14 each 0   gabapentin (NEURONTIN) 100 MG capsule Take 1 capsule (100 mg total) by mouth at bedtime. 30 capsule 3   hydrALAZINE (APRESOLINE) 100 MG tablet Take 1 tablet by mouth 3 times a day every Monday Wednesday Friday and Sunday Take 1 tablet by mouth 2 times a day every Tuesday Thursday  and Saturday (Patient taking differently: Take 100 mg by mouth See admin instructions. 100 mg three times daily on dialysis days Tuesday, Thursday, Saturday. 100 mg twice daily on regular days Sunday, Monday, Wednesday, Fridays) 30 tablet 0   Methoxy PEG-Epoetin Beta (MIRCERA IJ) Mircera     metoprolol succinate (TOPROL XL) 25 MG 24 hr tablet Take 2 tablets (50 mg total) by mouth daily. 30 tablet 2   pantoprazole (PROTONIX) 40 MG tablet Take 1 tablet (40 mg total) by mouth daily. 30 tablet 0   sevelamer (RENAGEL) 800 MG tablet Take 2 tablets by mouth 3 times a day every Tuesday Thursday and Saturday Give 2 tablets by mouth with meals every Monday Wednesday Friday and Sunday (Patient taking differently: Take 800 mg by mouth 3 (three) times daily with meals.) 60 tablet 0   sevelamer carbonate (RENVELA) 0.8 g PACK packet Take 0.8 g by mouth with breakfast, with lunch, and with evening  meal.     sulfamethoxazole-trimethoprim (BACTRIM) 400-80 MG tablet Take 1 tablet by mouth at bedtime. 30 tablet 1   No facility-administered medications prior to visit.     Social History   Socioeconomic History   Marital status: Widowed    Spouse name: Not on file   Number of children: Not on file   Years of education: Not on file   Highest education level: Not on file  Occupational History   Not on file  Tobacco Use   Smoking status: Never    Passive exposure: Never   Smokeless tobacco: Never  Vaping Use   Vaping Use: Never used  Substance and Sexual Activity   Alcohol use: No   Drug use: No   Sexual activity: Not Currently    Birth control/protection: Post-menopausal  Other Topics Concern   Not on file  Social History Narrative   Not on file   Social Determinants of Health   Financial Resource Strain: Not on file  Food Insecurity: No Food Insecurity (11/30/2022)   Hunger Vital Sign    Worried About Running Out of Food in the Last Year: Never true    Ran Out of Food in the Last Year: Never  true  Transportation Needs: No Transportation Needs (11/30/2022)   PRAPARE - Hydrologist (Medical): No    Lack of Transportation (Non-Medical): No  Physical Activity: Not on file  Stress: Not on file  Social Connections: Moderately Integrated (07/20/2022)   Social Connection and Isolation Panel [NHANES]    Frequency of Communication with Friends and Family: Three times a week    Frequency of Social Gatherings with Friends and Family: Three times a week    Attends Religious Services: 1 to 4 times per year    Active Member of Clubs or Organizations: Yes    Attends Archivist Meetings: 1 to 4 times per year    Marital Status: Widowed  Intimate Partner Violence: Not At Risk (11/30/2022)   Humiliation, Afraid, Rape, and Kick questionnaire    Fear of Current or Ex-Partner: No    Emotionally Abused: No    Physically Abused: No    Sexually Abused: No      Review of Systems    All other ros negative  Objective:    BP 138/75   Pulse 77   Temp 98 F (36.7 C) (Oral)   SpO2 100%  Nursing note and vital signs reviewed.  Physical Exam     General/constitutional: no distress, pleasant HEENT: Normocephalic, PER, Conj Clear, EOMI, Oropharynx clear Neck supple CV: rrr no mrg Lungs: clear to auscultation, normal respiratory effort Abd: Soft, Nontender Ext: no edema Skin: No Rash Neuro: nonfocal MSK: no peripheral joint swelling/tenderness/warmth; back spines nontender         Labs: Lab Results  Component Value Date   WBC 2.5 (L) 12/07/2022   HGB 8.9 (L) 12/07/2022   HCT 27.4 (L) 12/07/2022   MCV 100.7 (H) 12/07/2022   PLT 167 10/02/2535   Last metabolic panel Lab Results  Component Value Date   GLUCOSE 98 12/07/2022   NA 135 12/07/2022   K 5.0 12/07/2022   CL 94 (L) 12/07/2022   CO2 27 12/07/2022   BUN 36 (H) 12/07/2022   CREATININE 6.40 (H) 12/07/2022   GFRNONAA 6 (L) 12/07/2022   CALCIUM 9.6 12/07/2022   PHOS 4.6  12/07/2022   PROT 6.9 11/29/2022   ALBUMIN 2.3 (L) 12/07/2022   LABGLOB 3.6 10/17/2019  AGRATIO 0.6 (L) 10/17/2019   BILITOT 0.3 11/29/2022   ALKPHOS 48 11/29/2022   AST 15 11/29/2022   ALT 8 11/29/2022   ANIONGAP 14 12/07/2022       Component Value Date/Time   CRP 12.0 (H) 12/02/2022 0540   CRP 16.2 (H) 11/30/2022 2203   CRP 11.8 (H) 09/21/2022 2112    Micro:  Serology:  Imaging: I personally reviewed and incorporated into decision making  10/18 tte  1. Left ventricular ejection fraction, by estimation, is 60 to 65%. The  left ventricle has normal function. The left ventricle has no regional  wall motion abnormalities. There is mild concentric left ventricular  hypertrophy. Left ventricular diastolic  function could not be evaluated.   2. Right ventricular systolic function is normal. The right ventricular  size is normal. There is normal pulmonary artery systolic pressure.   3. Left atrial size was moderately dilated.   4. Previously noted mobile mass in the RA not visible on this study.  Right atrial size was mildly dilated.   5. The mitral valve is abnormal. Mild mitral valve regurgitation. No  evidence of mitral stenosis.   6. Tricuspid valve regurgitation is mild to moderate.   7. The aortic valve is tricuspid. There is mild calcification of the  aortic valve. Aortic valve regurgitation is trivial. Aortic valve  sclerosis/calcification is present, without any evidence of aortic  stenosis. Aortic valve area, by VTI measures 2.96  cm. Aortic valve mean gradient measures 3.0 mmHg. Aortic valve Vmax  measures 1.24 m/s.   8. Aortic dilatation noted. There is borderline dilatation of the  ascending aorta, measuring 38 mm.   9. The inferior vena cava is dilated in size with <50% respiratory  variability, suggesting right atrial pressure of 15 mmHg.    10/13 mri lumbar spine 1. Findings are concerning for progression of discitis/osteomyelitis with increased soft  tissue extent of infection in the paraspinal and epidural spaces. Compared to prior exam from 07/26/2022, there is increased abnormal soft tissue in the ventral epidural space at the L2-L3 and L3-L4 disc space levels resulting in moderate to severe spinal canal narrowing at these levels. There is also increased signal abnormality in the surrounding paraspinal soft tissues and bilateral psoas musculature. 2. Persistent severe spinal canal narrowing at the L5-S1 level secondary to a central disc protrusion. 3. Stable compression deformities of the L2, L3, L4, and L5 vertebral bodies with irregular endplate erosion at the L4-L5 level, best seen on CT of the L-spine obtained on 09/16/2022.  12/26 mri lumbar spine 1. Unchanged findings of discitis-osteomyelitis at L2-L3 and L4-L5 with worsening paravertebral inflammatory changes. No paravertebral or epidural abscess. 2. Unchanged mild-to-moderate L2-L3, moderate to severe L3-L4, and moderate L4-L5 spinal canal stenosis.     Assessment & Plan:   Problem List Items Addressed This Visit       Musculoskeletal and Integument   Vertebral osteomyelitis ([place]) - Primary     Other   MRSA bacteremia   Other Visit Diagnoses     ESRD (end stage renal disease) ([place])       Chronic hepatitis C with hepatic coma ([place])          ----------- 09/15/22 Will see what crp is doing Doesn't appear her infection is active at this time Exam showed no tenderness and the pain suspected to be neuropathic  Will repeat lab again and evaluation in 4 weeks Advise complex pain clinic management  If crp continues to go up will consider  mri  Will start gabapentin  Advise she talks to pcp about  pain clinic referral   -------- 10/15/22 assessment Recurrent bacteremia mrsa with lumbar spine om; no frank epidural abscess Back pain stable Getting vanc with iHD until 11/15/2022 Will reassess then to see if she still needs oral abx tail  Trial of  lidocaine patch -- rx Increase gabapentin gradually to 300 mg qhs -- renew rx  See me around 2nd week of December  ------------ 01/05/23 id clinic f/u Recent fever 12/25 negative sepsis w/u. Crp stable high at low teens. Mri lumbar spine 12/26 no new abscess or discitis Finished 8 weeks 11/15/22 but given bactrim during that admission She is 4-5 months into abx course now  Will stop today as clinically improved   Will see her again in 4-5 weeks off antibiotics and check crp and clinicaly exam to ascertain remission. See me sooner if sign of infection   I have spent a total of 30 minutes of face-to-face and non-face-to-face time, excluding clinical staff time, preparing to see patient, ordering tests and/or medications, and provide counseling the patient    Follow-up: Return in about 6 weeks (around 02/16/2023).      Jabier Mutton, [facility] 01/05/2023, 4:01 PM

## 2023-01-05 NOTE — Patient Instructions (Signed)
  Let's stop antibiotics (bactrim -- generic name is trimethoprim-sulfamethoxazole) today   See me in around 6 weeks to reevaluate and get labs to check for sign of relapse of infection   However, see me sooner if persistent worsening back pain, fever, chill, malaise

## 2023-01-11 ENCOUNTER — Other Ambulatory Visit: Payer: Self-pay

## 2023-01-11 DIAGNOSIS — Z1231 Encounter for screening mammogram for malignant neoplasm of breast: Secondary | ICD-10-CM

## 2023-01-15 ENCOUNTER — Emergency Department (HOSPITAL_COMMUNITY): Payer: Medicare (Managed Care)

## 2023-01-15 ENCOUNTER — Inpatient Hospital Stay (HOSPITAL_COMMUNITY)
Admission: EM | Admit: 2023-01-15 | Discharge: 2023-01-26 | DRG: 539 | Disposition: A | Discharge status: Skilled Nursing Facility | Payer: Medicare (Managed Care) | Attending: Internal Medicine | Admitting: Internal Medicine

## 2023-01-15 ENCOUNTER — Other Ambulatory Visit: Payer: Self-pay

## 2023-01-15 DIAGNOSIS — M4626 Osteomyelitis of vertebra, lumbar region: Principal | ICD-10-CM | POA: Diagnosis present

## 2023-01-15 DIAGNOSIS — E209 Hypoparathyroidism, unspecified: Secondary | ICD-10-CM | POA: Diagnosis present

## 2023-01-15 DIAGNOSIS — Z7901 Long term (current) use of anticoagulants: Secondary | ICD-10-CM

## 2023-01-15 DIAGNOSIS — Z91158 Patient's noncompliance with renal dialysis for other reason: Secondary | ICD-10-CM

## 2023-01-15 DIAGNOSIS — Z681 Body mass index (BMI) 19 or less, adult: Secondary | ICD-10-CM

## 2023-01-15 DIAGNOSIS — Z801 Family history of malignant neoplasm of trachea, bronchus and lung: Secondary | ICD-10-CM

## 2023-01-15 DIAGNOSIS — E039 Hypothyroidism, unspecified: Secondary | ICD-10-CM | POA: Diagnosis present

## 2023-01-15 DIAGNOSIS — E860 Dehydration: Secondary | ICD-10-CM | POA: Diagnosis present

## 2023-01-15 DIAGNOSIS — Z800 Family history of malignant neoplasm of digestive organs: Secondary | ICD-10-CM

## 2023-01-15 DIAGNOSIS — E872 Acidosis, unspecified: Secondary | ICD-10-CM | POA: Diagnosis present

## 2023-01-15 DIAGNOSIS — G8929 Other chronic pain: Secondary | ICD-10-CM | POA: Diagnosis present

## 2023-01-15 DIAGNOSIS — M8610 Other acute osteomyelitis, unspecified site: Secondary | ICD-10-CM | POA: Diagnosis not present

## 2023-01-15 DIAGNOSIS — Z992 Dependence on renal dialysis: Secondary | ICD-10-CM

## 2023-01-15 DIAGNOSIS — B182 Chronic viral hepatitis C: Secondary | ICD-10-CM | POA: Diagnosis present

## 2023-01-15 DIAGNOSIS — Z8249 Family history of ischemic heart disease and other diseases of the circulatory system: Secondary | ICD-10-CM

## 2023-01-15 DIAGNOSIS — R64 Cachexia: Secondary | ICD-10-CM | POA: Diagnosis present

## 2023-01-15 DIAGNOSIS — J45909 Unspecified asthma, uncomplicated: Secondary | ICD-10-CM | POA: Diagnosis present

## 2023-01-15 DIAGNOSIS — E43 Unspecified severe protein-calorie malnutrition: Secondary | ICD-10-CM | POA: Diagnosis present

## 2023-01-15 DIAGNOSIS — I4892 Unspecified atrial flutter: Secondary | ICD-10-CM | POA: Diagnosis not present

## 2023-01-15 DIAGNOSIS — D631 Anemia in chronic kidney disease: Secondary | ICD-10-CM | POA: Diagnosis present

## 2023-01-15 DIAGNOSIS — N2581 Secondary hyperparathyroidism of renal origin: Secondary | ICD-10-CM | POA: Diagnosis present

## 2023-01-15 DIAGNOSIS — K6812 Psoas muscle abscess: Secondary | ICD-10-CM | POA: Diagnosis present

## 2023-01-15 DIAGNOSIS — Z79899 Other long term (current) drug therapy: Secondary | ICD-10-CM

## 2023-01-15 DIAGNOSIS — G9341 Metabolic encephalopathy: Secondary | ICD-10-CM | POA: Diagnosis present

## 2023-01-15 DIAGNOSIS — I480 Paroxysmal atrial fibrillation: Secondary | ICD-10-CM | POA: Diagnosis present

## 2023-01-15 DIAGNOSIS — I10 Essential (primary) hypertension: Secondary | ICD-10-CM | POA: Diagnosis present

## 2023-01-15 DIAGNOSIS — N186 End stage renal disease: Secondary | ICD-10-CM

## 2023-01-15 DIAGNOSIS — Z7951 Long term (current) use of inhaled steroids: Secondary | ICD-10-CM

## 2023-01-15 DIAGNOSIS — M4646 Discitis, unspecified, lumbar region: Secondary | ICD-10-CM | POA: Diagnosis present

## 2023-01-15 DIAGNOSIS — I120 Hypertensive chronic kidney disease with stage 5 chronic kidney disease or end stage renal disease: Secondary | ICD-10-CM | POA: Diagnosis present

## 2023-01-15 DIAGNOSIS — M4620 Osteomyelitis of vertebra, site unspecified: Principal | ICD-10-CM

## 2023-01-15 HISTORY — DX: Paroxysmal atrial fibrillation: I48.0

## 2023-01-15 HISTORY — DX: Bacteremia: R78.81

## 2023-01-15 HISTORY — DX: Unspecified right bundle-branch block: I45.10

## 2023-01-15 HISTORY — DX: Unspecified viral hepatitis C without hepatic coma: B19.20

## 2023-01-15 HISTORY — DX: End stage renal disease: N18.6

## 2023-01-15 LAB — CBC WITH DIFFERENTIAL/PLATELET
Abs Immature Granulocytes: 0.04 10*3/uL (ref 0.00–0.07)
Basophils Absolute: 0 10*3/uL (ref 0.0–0.1)
Basophils Relative: 0 %
Eosinophils Absolute: 0 10*3/uL (ref 0.0–0.5)
Eosinophils Relative: 0 %
HCT: 28.7 % — ABNORMAL LOW (ref 36.0–46.0)
Hemoglobin: 9.3 g/dL — ABNORMAL LOW (ref 12.0–15.0)
Immature Granulocytes: 1 %
Lymphocytes Relative: 8 %
Lymphs Abs: 0.7 10*3/uL (ref 0.7–4.0)
MCH: 34.4 pg — ABNORMAL HIGH (ref 26.0–34.0)
MCHC: 32.4 g/dL (ref 30.0–36.0)
MCV: 106.3 fL — ABNORMAL HIGH (ref 80.0–100.0)
Monocytes Absolute: 0.6 10*3/uL (ref 0.1–1.0)
Monocytes Relative: 8 %
Neutro Abs: 6.8 10*3/uL (ref 1.7–7.7)
Neutrophils Relative %: 83 %
Platelets: 246 10*3/uL (ref 150–400)
RBC: 2.7 MIL/uL — ABNORMAL LOW (ref 3.87–5.11)
RDW: 20.3 % — ABNORMAL HIGH (ref 11.5–15.5)
WBC: 8.2 10*3/uL (ref 4.0–10.5)
nRBC: 0 % (ref 0.0–0.2)

## 2023-01-15 LAB — COMPREHENSIVE METABOLIC PANEL
ALT: 6 U/L (ref 0–44)
AST: 31 U/L (ref 15–41)
Albumin: 2.6 g/dL — ABNORMAL LOW (ref 3.5–5.0)
Alkaline Phosphatase: 52 U/L (ref 38–126)
Anion gap: 17 — ABNORMAL HIGH (ref 5–15)
BUN: 46 mg/dL — ABNORMAL HIGH (ref 8–23)
CO2: 27 mmol/L (ref 22–32)
Calcium: 9.4 mg/dL (ref 8.9–10.3)
Chloride: 92 mmol/L — ABNORMAL LOW (ref 98–111)
Creatinine, Ser: 8.76 mg/dL — ABNORMAL HIGH (ref 0.44–1.00)
GFR, Estimated: 4 mL/min — ABNORMAL LOW (ref >60)
Glucose, Bld: 82 mg/dL (ref 70–99)
Potassium: 5 mmol/L (ref 3.5–5.1)
Sodium: 136 mmol/L (ref 135–145)
Total Bilirubin: 1 mg/dL (ref 0.3–1.2)
Total Protein: 7 g/dL (ref 6.5–8.1)

## 2023-01-15 LAB — C-REACTIVE PROTEIN: CRP: 33.8 mg/dL — ABNORMAL HIGH (ref <1.0)

## 2023-01-15 LAB — SEDIMENTATION RATE: Sed Rate: 133 mm/hr — ABNORMAL HIGH (ref 0–22)

## 2023-01-15 LAB — LACTIC ACID, PLASMA: Lactic Acid, Venous: 1.9 mmol/L (ref 0.5–1.9)

## 2023-01-15 MED ORDER — ACETAMINOPHEN 325 MG PO TABS
650.0000 mg | ORAL_TABLET | Freq: Once | ORAL | Status: AC
  Administered 2023-01-15: 650 mg via ORAL
  Filled 2023-01-15: qty 2

## 2023-01-15 MED ORDER — VANCOMYCIN HCL 1250 MG/250ML IV SOLN
1250.0000 mg | Freq: Once | INTRAVENOUS | Status: AC
  Administered 2023-01-16: 1250 mg via INTRAVENOUS
  Filled 2023-01-15: qty 250

## 2023-01-15 NOTE — ED Provider Triage Note (Signed)
Emergency Medicine Provider Triage Evaluation Note  Diamond [name] , a 72 y.o. female  was evaluated in triage.  Pt complains of back pain.  Pt has a history of osteomyelitis. Pt has had sepsis in the past.  Pt is a dialysis pt.  Pt missed Friday dialysis   Review of Systems  Positive:  Negative:   Physical Exam  Temp 99.4 F (37.4 C) (Oral)  Gen:   Awake, no distress   Resp:  Normal effort  MSK:    Other:    Medical Decision Making  Medically screening exam initiated at 12:26 PM.  Appropriate orders placed.  Diamond [name] was informed that the remainder of the evaluation will be completed by another provider, this initial triage assessment does not replace that evaluation, and the importance of remaining in the ED until their evaluation is complete.     Fransico Meadow, [place] 01/15/23 1227

## 2023-01-15 NOTE — ED Notes (Signed)
MD notified we could only get 1 set of blood cultures

## 2023-01-15 NOTE — ED Provider Notes (Signed)
Care transferred to me. Patient with worsening MRI findings. Discussed with Dr. Tommy Medal. Will give IV vanc given recent MRSA bacteremia and admit. Discussed with Dr. Flossie Buffy for admission.  Otherwise she is not septic or ill-appearing.   Sherwood Gambler, MD 01/15/23 [phone]

## 2023-01-15 NOTE — ED Notes (Signed)
IV team at bedside 

## 2023-01-15 NOTE — ED Notes (Signed)
Pt refusing COVID test

## 2023-01-15 NOTE — H&P (Incomplete)
History and Physical    Patient: Diamond [name] [id number] DOB: January 28, 1951 DOA: 01/15/2023 DOS: the patient was seen and examined on 01/15/2023 PCP: [facility] [place]  Patient coming from: Home  Chief Complaint:  Chief Complaint  Patient presents with  . Back Pain   HPI: [name] is a 72 y.o. 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  Pt has recurrent MRS bacteremia with lumbar discitis/osteomyelitis. Had numerous hospitalization since last year. Last August required 4 weeks of bactrim per infectious disease. Then readmitted October with recurrence and place on 8 weeks of IV vancomycin post HD.  Review of Systems: {ROS_Text:26778} Past Medical History:  Diagnosis Date  . Anemia   . Asthma   . Atrial flutter with rapid ventricular response ([place]) 12/25/2021  . Chronic kidney disease    dialysis Tues Thurs Sat  . Gout   . Hypertension    Past Surgical History:  Procedure Laterality Date  . AV FISTULA PLACEMENT Left 12/23/2021   Procedure: LEFT ARM BRACHIOBASILIC VEIN ARTERIOVENOUS (AV) FISTULA CREATION;  Surgeon: [name], MD;  Location: [facility];  Service: Vascular;  Laterality: Left;  . BASCILIC VEIN TRANSPOSITION Left 03/08/2022   Procedure: LEFT SECOND STAGE BASILIC VEIN TRANSPOSITION;  Surgeon: [name], MD;  Location: [facility];  Service: Vascular;  Laterality: Left;  . BIOPSY  01/19/2022   Procedure: BIOPSY;  Surgeon: Lavena Bullion, DO;  Location: [facility];  Service: Gastroenterology;;  . [place]  . CHOLECYSTECTOMY N/A 06/25/2022   Procedure: OPEN CHOLECYSTECTOMY;  Surgeon: Dwan Bolt, MD;  Location: [facility];  Service: General;  Laterality: N/A;  . COLONOSCOPY Left  01/19/2022   Procedure: COLONOSCOPY;  Surgeon: Lavena Bullion, DO;  Location: [facility];  Service: Gastroenterology;  Laterality: Left;  . ESOPHAGOGASTRODUODENOSCOPY Left 01/19/2022   Procedure: ESOPHAGOGASTRODUODENOSCOPY (EGD);  Surgeon: Lavena Bullion, DO;  Location[facility] ENDOSCOPY;  Service: Gastroenterology;  Laterality: Left;  . INSERTION OF DIALYSIS CATHETER Right 12/23/2021   Procedure: INSERTION OF DIALYSIS CATHETER;  Surgeon: [name], MD;  Location: [facility];  Service: Vascular;  Laterality: Right;  . LAPAROSCOPY N/A 06/25/2022   Procedure: STAGING LAPAROSCOPY;  Surgeon: Dwan Bolt, MD;  Location: [facility];  Service: General;  Laterality: N/A;  . OPEN PARTIAL HEPATECTOMY  N/A 06/25/2022   Procedure: OPEN PARTIAL HEPATECTOMY;  Surgeon: Dwan Bolt, MD;  Location: [facility];  Service: General;  Laterality: N/A;  . POLYPECTOMY  01/19/2022   Procedure: POLYPECTOMY;  Surgeon: Lavena Bullion, DO;  Location: [facility] ENDOSCOPY;  Service: Gastroenterology;;  . TEE WITHOUT CARDIOVERSION N/A 12/22/2021   Procedure: TRANSESOPHAGEAL ECHOCARDIOGRAM (TEE);  Surgeon: Jerline Pain, MD;  Location[facility] ENDOSCOPY;  Service: Cardiovascular;  Laterality: N/A;  . TEE WITHOUT CARDIOVERSION N/A 06/17/2022   Procedure: TRANSESOPHAGEAL ECHOCARDIOGRAM (TEE);  Surgeon: Buford Dresser, MD;  Location: [facility];  Service: Cardiovascular;  Laterality: N/A;  . ULTRASOUND GUIDANCE FOR VASCULAR ACCESS  12/23/2021   Procedure: ULTRASOUND GUIDANCE FOR VASCULAR ACCESS;  Surgeon: [name], MD;  Location: [facility];  Service: Vascular;;   Social History:  reports that she has never smoked. She has never been exposed to tobacco smoke. She has never used smokeless tobacco. She reports that she does not drink alcohol and does not  use drugs.  Allergies  Allergen Reactions  . Shrimp (Diagnostic) Other (See Comments)    Unknown reaction  . Other Swelling and Rash    Anesthesia - facial swelling, rash     Family History  Problem Relation Age of Onset  . Hypertension Mother   . Hypertension Father   . Colon cancer Brother   . Lung cancer Brother     Prior to Admission medications   Medication Sig Start Date End Date Taking? Authorizing Provider  acetaminophen (TYLENOL) 325 MG tablet Take 2 tablets (650 mg total) by mouth 4 (four) times daily. Patient taking differently: Take 650 mg by mouth 4 (four) times daily as needed for moderate pain. 06/30/22   Elgergawy, Silver Huguenin, MD  albuterol (VENTOLIN HFA) 108 (90 Base) MCG/ACT inhaler Inhale 2 puffs into the lungs every 4 (four) hours as needed for wheezing or shortness of breath. 10/06/22   Ripley Fraise, MD  apixaban (ELIQUIS) 2.5 MG TABS tablet Take 1 tablet (2.5 mg total) by mouth 2 (two) times daily. 10/06/22 12/01/23  Ripley Fraise, MD  cloNIDine (CATAPRES) 0.1 MG tablet Take 0.1 mg by mouth every 6 (six) hours as needed (SBP > 170).    [provider]  fluticasone-salmeterol (ADVAIR DISKUS) 250-50 MCG/ACT AEPB Inhale 1 puff into the lungs in the morning and at bedtime. 10/06/22   Ripley Fraise, MD  gabapentin (NEURONTIN) 100 MG capsule Take 1 capsule (100 mg total) by mouth at bedtime. 10/15/22   Prudencio Pair T, MD  hydrALAZINE (APRESOLINE) 100 MG tablet Take 1 tablet by mouth 3 times a day every Monday Wednesday Friday and Sunday Take 1 tablet by mouth 2 times a day every Tuesday Thursday and Saturday Patient taking differently: Take 100 mg by mouth See admin instructions. 100 mg three times daily on dialysis days Tuesday, Thursday, Saturday. 100 mg twice daily on regular days Sunday, Monday, Wednesday, Fridays 10/06/22   Ripley Fraise, MD  Methoxy PEG-Epoetin Beta (MIRCERA IJ) Mircera 10/09/22 10/08/23  [provider]  metoprolol succinate (TOPROL XL) 25 MG 24 hr tablet Take 2 tablets (50 mg total) by mouth daily. 12/03/22   Linward Natal, MD  pantoprazole (PROTONIX) 40 MG tablet Take 1 tablet (40 mg total) by  mouth daily. 10/06/22   Ripley Fraise, MD  sevelamer (RENAGEL) 800 MG tablet Take 2 tablets by mouth 3 times a day every Tuesday Thursday and Saturday Give 2 tablets by mouth with meals every Monday Wednesday Friday and Sunday Patient taking differently: Take 800 mg by mouth 3 (three) times daily with meals. 10/06/22   Ripley Fraise, MD  sevelamer carbonate (RENVELA) 0.8 g PACK packet Take 0.8 g by mouth with breakfast, with lunch, and with evening meal.    [provider]  sulfamethoxazole-trimethoprim (BACTRIM) 400-80 MG tablet Take 1 tablet by mouth at bedtime. 12/03/22 02/01/23  Linward Natal, MD    Physical Exam: Vitals:   01/15/23 2000 01/15/23 2015 01/15/23 2045 01/15/23 2143  BP: (!) 148/98 (!) 155/85 (!) 162/93   Pulse: 77 74 74   Resp: 18 (!) 23 17   Temp:    98.9 F (37.2 C)  TempSrc:    Oral  SpO2: 100% 99% 99%    *** Data Reviewed: {Tip this will not be part of the note when signed- Document your independent interpretation of telemetry tracing, EKG, lab, Radiology test or any other diagnostic tests. Add any new diagnostic test ordered today. (Optional):26781} {Results:26384}  Assessment and Plan: No notes have been  filed under this hospital service. Service: Hospitalist     Advance Care Planning:   Code Status: Prior ***  Consults: ***  Family Communication: ***  Severity of Illness: {Observation/Inpatient:21159}  Author: Orene Desanctis, DO 01/15/2023 11:54 PM  For on call review [email or web address].

## 2023-01-15 NOTE — ED Notes (Signed)
Transported to MRI

## 2023-01-15 NOTE — ED Triage Notes (Signed)
PT BIB EMS due to weakness, back pain, and not eating for 2 days. Pt is non complaint with dialysis. Pt last had a full tx of Wednsday. Axox4, uncooperative.

## 2023-01-15 NOTE — H&P (Signed)
History and Physical    Patient: Diamond [name] [id number] DOB: 03/21/1951 DOA: 01/15/2023 DOS: the patient was seen and examined on 01/16/2023 PCP: [facility]  Patient coming from: Home  Chief Complaint:  Chief Complaint  Patient presents with   Back Pain   HPI: Diamond [name] is a 72 y.o. 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  Pt has hx of recurrent MRSA bacteremia with lumbar discitis/osteomyelitis. Had numerous hospitalization since last year. Last August required 4 weeks of bactrim per infectious disease. Then readmitted October with recurrence and place on 8 weeks of IV vancomycin post HD. Again admitted for the same late December and placed on Bactrim suppression.  Last had outpatient follow up with infectious disease Dr. Gale Journey on 01/05/2023 and bactrim was discontinued.   Had worsening lower back pain in the past week. No lower extremity weakness. No bowel or bladder incontinence. No fever.   MRI L-spine today shows persistent findings concerning for osteomyelitis discitis at L2-3 and L4-5 with slight interval worsening in paravertebral inflammatory changes as compared to previous MRI from 11/30/2022. Superimposed 2.3 cm heterogeneous area of involving the left psoas muscle at the level of L3 likely reflects phlegmon. No drainable abscess.   CRP elevated to 33, ESR of 133.   No leukocytosis but WBC is trending upwards at 8.3. Lactate within normal limits. Hgb at baseline 9.3.  Creatinine of 8.76 with prior around 4-6. Anion gap of 17.    EDP discussed with infectious disease Dr. Tommy Medal who recommends placing her on IV Vancomycin and will see in consultation.    Review of Systems: unable to  review all systems due to the inability of the patient to answer questions. Past Medical History:  Diagnosis Date   Anemia    Asthma    Atrial flutter with rapid ventricular response ([place]) 12/25/2021   Chronic kidney disease    dialysis Tues Thurs Sat   Gout    Hypertension    Past Surgical History:  Procedure Laterality Date   AV FISTULA PLACEMENT Left 12/23/2021   Procedure: LEFT ARM BRACHIOBASILIC VEIN ARTERIOVENOUS (AV) FISTULA CREATION;  Surgeon: [name], MD;  Location: [facility];  Service: Vascular;  Laterality: Left;   [place] Left 03/08/2022   Procedure: LEFT SECOND STAGE BASILIC VEIN TRANSPOSITION;  Surgeon: [name], MD;  Location: [facility] OR;  Service: Vascular;  Laterality: Left;   BIOPSY  01/19/2022   Procedure: BIOPSY;  Surgeon: Lavena Bullion, DO;  Location: [facility] ENDOSCOPY;  Service: Gastroenterology;;   [place] N/A 06/25/2022   Procedure: OPEN CHOLECYSTECTOMY;  Surgeon: Dwan Bolt, MD;  Location: [facility];  Service: General;  Laterality: N/A;   COLONOSCOPY Left 01/19/2022   Procedure: COLONOSCOPY;  Surgeon: Lavena Bullion, DO;  Location: [facility];  Service: Gastroenterology;  Laterality: Left;   ESOPHAGOGASTRODUODENOSCOPY Left 01/19/2022   Procedure: ESOPHAGOGASTRODUODENOSCOPY (EGD);  Surgeon: Lavena Bullion, DO;  Location[facility]COPY;  Service: Gastroenterology;  Laterality: Left;   INSERTION OF DIALYSIS CATHETER Right 12/23/2021   Procedure: INSERTION OF DIALYSIS CATHETER;  Surgeon: [name], MD;  Location: [place];  Service: Vascular;  Laterality: Right;   LAPAROSCOPY N/A 06/25/2022   Procedure: STAGING LAPAROSCOPY;  Surgeon: Dwan Bolt, MD;  Location: [place];  Service: General;  Laterality: N/A;   OPEN PARTIAL HEPATECTOMY  N/A 06/25/2022   Procedure: OPEN PARTIAL HEPATECTOMY;  Surgeon: Dwan Bolt, MD;  Location: [place];  Service: General;  Laterality: N/A;   POLYPECTOMY  01/19/2022    Procedure: POLYPECTOMY;  Surgeon: Lavena Bullion, DO;  Location: [place] ENDOSCOPY;  Service: Gastroenterology;;   TEE WITHOUT CARDIOVERSION N/A 12/22/2021   Procedure: TRANSESOPHAGEAL ECHOCARDIOGRAM (TEE);  Surgeon: Jerline Pain, MD;  Location: [facility] ENDOSCOPY;  Service: Cardiovascular;  Laterality: N/A;   TEE WITHOUT CARDIOVERSION N/A 06/17/2022   Procedure: TRANSESOPHAGEAL ECHOCARDIOGRAM (TEE);  Surgeon: Buford Dresser, MD;  Location: [place];  Service: Cardiovascular;  Laterality: N/A;   ULTRASOUND GUIDANCE FOR VASCULAR ACCESS  12/23/2021   Procedure: ULTRASOUND GUIDANCE FOR VASCULAR ACCESS;  Surgeon: [name], MD;  Location: [place];  Service: Vascular;;   Social History:  reports that she has never smoked. She has never been exposed to tobacco smoke. She has never used smokeless tobacco. She reports that she does not drink alcohol and does not use drugs.  Allergies  Allergen Reactions   Shrimp (Diagnostic) Other (See Comments)    Unknown reaction   Other Swelling and Rash    Anesthesia - facial swelling, rash    Family History  Problem Relation Age of Onset   Hypertension Mother    Hypertension Father    Colon cancer Brother    Lung cancer Brother     Prior to Admission medications   Medication Sig Start Date End Date Taking? Authorizing Provider  acetaminophen (TYLENOL) 325 MG tablet Take 2 tablets (650 mg total) by mouth 4 (four) times daily. Patient taking differently: Take 650 mg by mouth 4 (four) times daily as needed for moderate pain. 06/30/22   Elgergawy, Silver Huguenin, MD  albuterol (VENTOLIN HFA) 108 (90 Base) MCG/ACT inhaler Inhale 2 puffs into the lungs every 4 (four) hours as needed for wheezing or shortness of breath. 10/06/22   Ripley Fraise, MD  apixaban (ELIQUIS) 2.5 MG TABS tablet Take 1 tablet (2.5 mg total) by mouth 2 (two) times daily. 10/06/22 12/01/23  Ripley Fraise, MD  cloNIDine (CATAPRES) 0.1 MG tablet Take 0.1 mg by mouth every 6 (six)  hours as needed (SBP > 170).    [provider]  fluticasone-salmeterol (ADVAIR DISKUS) 250-50 MCG/ACT AEPB Inhale 1 puff into the lungs in the morning and at bedtime. 10/06/22   Ripley Fraise, MD  gabapentin (NEURONTIN) 100 MG capsule Take 1 capsule (100 mg total) by mouth at bedtime. 10/15/22   Prudencio Pair T, MD  hydrALAZINE (APRESOLINE) 100 MG tablet Take 1 tablet by mouth 3 times a day every Monday Wednesday Friday and Sunday Take 1 tablet by mouth 2 times a day every Tuesday Thursday and Saturday Patient taking differently: Take 100 mg by mouth See admin instructions. 100 mg three times daily on dialysis days Tuesday, Thursday, Saturday. 100 mg twice daily on regular days Sunday, Monday, Wednesday, Fridays 10/06/22   Ripley Fraise, MD  Methoxy PEG-Epoetin Beta (MIRCERA IJ) Mircera 10/09/22 10/08/23  [provider]  metoprolol succinate (TOPROL XL) 25 MG 24 hr tablet Take 2 tablets (50 mg total) by mouth daily. 12/03/22   Linward Natal, MD  pantoprazole (PROTONIX) 40 MG tablet Take 1 tablet (40 mg total) by mouth daily. 10/06/22   Ripley Fraise, MD  sevelamer (RENAGEL) 800 MG tablet Take 2 tablets by mouth 3 times a day every Tuesday Thursday and Saturday Give 2 tablets  by mouth with meals every Monday Wednesday Friday and Sunday Patient taking differently: Take 800 mg by mouth 3 (three) times daily with meals. 10/06/22   Ripley Fraise, MD  sevelamer carbonate (RENVELA) 0.8 g PACK packet Take 0.8 g by mouth with breakfast, with lunch, and with evening meal.    [provider]  sulfamethoxazole-trimethoprim (BACTRIM) 400-80 MG tablet Take 1 tablet by mouth at bedtime. 12/03/22 02/01/23  Linward Natal, MD    Physical Exam: Vitals:   01/15/23 2000 01/15/23 2015 01/15/23 2045 01/15/23 2143  BP: (!) 148/98 (!) 155/85 (!) 162/93   Pulse: 77 74 74   Resp: 18 (!) 23 17   Temp:    98.9 F (37.2 C)  TempSrc:    Oral  SpO2: 100% 99% 99%    Constitutional: NAD,  calm, comfortable, elderly female laying in bed Eyes: lids and conjunctivae normal ENMT: Mucous membranes are dry  Neck: normal, supple Respiratory: clear to auscultation bilaterally, no wheezing, no crackles. Normal respiratory effort. No accessory muscle use.  Cardiovascular: Regular rate and rhythm, no murmurs / rubs / gallops. No extremity edema. Left UE AV fistula.  Abdomen: no tenderness, soft, non-distended, Bowel sounds positive.  Musculoskeletal: no clubbing / cyanosis. No joint deformity upper and lower extremities. Skin: no rashes, lesions, ulcers.  Neurologic: CN 2-12 grossly intact. Alert and oriented to self, place but not time. Has slow delayed response to questions. Appears disoriented at times.  Psychiatric: Has disorientation.  Data Reviewed:  See HPI  Assessment and Plan: * Acute on chronic osteomyelitis ([place]) -hx of recurrent MRSA bacteremia with lumbar discitis/osteomyelitis on approximately 4-5 months of antibiotics here with worsening discitis at L2-3 and L4-5 as well developing phlegmon at L3 and left psoas muscle. ESR and CPR both elevated. -follows with ID Dr. Gale Journey outpatient and last on Bactrim on 01/05/2023.  -blood cultures are pending -infectious disease Dr. Tommy Medal recommends placing her on IV Vancomycin and will see in consultation.   Acute metabolic encephalopathy -Pt slow to response and appears confused at times. Alert and oriented to self, place but not time. Suspect due to dehydration and acute illness -continue to monitor clinically  ESRD on hemodialysis ([place]) -Pt tells me her days are MWF but per documentation it is Tues/Thurs/Sat. Cannot recall her last dialysis session. Creatinine is elevated to 8.3 with previous around 4-6 suggestive that she may have missed a session. She may need unscheduled dialysis tomorrow. Need nephrology consult in the morning.  Paroxysmal atrial flutter (HCC) -continue Eliquis -not able to reach daughter to verify other  home meds. Need to follow up in the morning.   Hypertension -will need to resume antihypertensive once able to verify meds with family in the morning.   Metabolic acidosis -secondary to worsening renal function. Elevated creatinine suggestive of missed dialysis session. Has elevated anion gap at 17 and appears dehydration on exam. Will give small volume 500ccc LR bolus fluid.       Advance Care Planning:   Code Status: Full Code   Consults: infectious disease  Family Communication:  attempted to reach daughter Juliea Eppinger) but sent to voicemail  Severity of Illness: The appropriate patient status for this patient is INPATIENT. Inpatient status is judged to be reasonable and necessary in order to provide the required intensity of service to ensure the patient's safety. The patient's presenting symptoms, physical exam findings, and initial radiographic and laboratory data in the context of their chronic comorbidities is felt to place them at high  risk for further clinical deterioration. Furthermore, it is not anticipated that the patient will be medically stable for discharge from the hospital within 2 midnights of admission.   * I certify that at the point of admission it is my clinical judgment that the patient will require inpatient hospital care spanning beyond 2 midnights from the point of admission due to high intensity of service, high risk for further deterioration and high frequency of surveillance required.*  Author: Orene Desanctis, DO 01/16/2023 12:59 AM  For on call review [email or web address].

## 2023-01-15 NOTE — ED Provider Notes (Signed)
[facility] Provider Note   CSN: [id number] Arrival date & time: 01/15/23  1211     History  Chief Complaint  Patient presents with   Back Pain    Diamond [name] is a 72 y.o. female.  HPI     72 year old female with history of ESRD on HD, last hemodialysis session was on Wednesday, hypertension, A-fib, discitis status post antibiotic treatment comes to the ER with chief complaint of back pain, weakness and loss of appetite.  Patient states that she has been feeling sick for the last 3 to 4 days.  She did not go to her Friday dialysis because she was feeling weak.  She has poor appetite, weakness.  She denies any chills, sweats, fevers, nausea, vomiting, URI-like symptoms, abdominal pain.  She is reporting that her back pain, which is chronic now is worse than usual.  Home Medications Prior to Admission medications   Medication Sig Start Date End Date Taking? Authorizing Provider  acetaminophen (TYLENOL) 325 MG tablet Take 2 tablets (650 mg total) by mouth 4 (four) times daily. Patient taking differently: Take 650 mg by mouth 4 (four) times daily as needed for moderate pain. 06/30/22   Elgergawy, Silver Huguenin, MD  albuterol (VENTOLIN HFA) 108 (90 Base) MCG/ACT inhaler Inhale 2 puffs into the lungs every 4 (four) hours as needed for wheezing or shortness of breath. 10/06/22   Ripley Fraise, MD  apixaban (ELIQUIS) 2.5 MG TABS tablet Take 1 tablet (2.5 mg total) by mouth 2 (two) times daily. 10/06/22 12/01/23  Ripley Fraise, MD  cloNIDine (CATAPRES) 0.1 MG tablet Take 0.1 mg by mouth every 6 (six) hours as needed (SBP > 170).    [provider]  fluticasone-salmeterol (ADVAIR DISKUS) 250-50 MCG/ACT AEPB Inhale 1 puff into the lungs in the morning and at bedtime. 10/06/22   Ripley Fraise, MD  gabapentin (NEURONTIN) 100 MG capsule Take 1 capsule (100 mg total) by mouth at bedtime. 10/15/22   Prudencio Pair T, MD  hydrALAZINE (APRESOLINE)  100 MG tablet Take 1 tablet by mouth 3 times a day every Monday Wednesday Friday and Sunday Take 1 tablet by mouth 2 times a day every Tuesday Thursday and Saturday Patient taking differently: Take 100 mg by mouth See admin instructions. 100 mg three times daily on dialysis days Tuesday, Thursday, Saturday. 100 mg twice daily on regular days Sunday, Monday, Wednesday, Fridays 10/06/22   Ripley Fraise, MD  Methoxy PEG-Epoetin Beta (MIRCERA IJ) Mircera 10/09/22 10/08/23  [provider]  metoprolol succinate (TOPROL XL) 25 MG 24 hr tablet Take 2 tablets (50 mg total) by mouth daily. 12/03/22   Linward Natal, MD  pantoprazole (PROTONIX) 40 MG tablet Take 1 tablet (40 mg total) by mouth daily. 10/06/22   Ripley Fraise, MD  sevelamer (RENAGEL) 800 MG tablet Take 2 tablets by mouth 3 times a day every Tuesday Thursday and Saturday Give 2 tablets by mouth with meals every Monday Wednesday Friday and Sunday Patient taking differently: Take 800 mg by mouth 3 (three) times daily with meals. 10/06/22   Ripley Fraise, MD  sevelamer carbonate (RENVELA) 0.8 g PACK packet Take 0.8 g by mouth with breakfast, with lunch, and with evening meal.    [provider]  sulfamethoxazole-trimethoprim (BACTRIM) 400-80 MG tablet Take 1 tablet by mouth at bedtime. 12/03/22 02/01/23  Linward Natal, MD      Allergies    Shrimp (diagnostic) and Other    Review of Systems  Review of Systems  All other systems reviewed and are negative.   Physical Exam Updated Vital Signs BP (!) 160/97   Pulse 76   Temp 99.4 F (37.4 C) (Oral)   Resp 17   SpO2 98%  Physical Exam Vitals and nursing note reviewed.  Constitutional:      Appearance: She is well-developed.  HENT:     Head: Atraumatic.  Cardiovascular:     Rate and Rhythm: Normal rate.  Pulmonary:     Effort: Pulmonary effort is normal.  Musculoskeletal:     Cervical back: Normal range of motion and neck supple.  Skin:    General: Skin  is warm and dry.  Neurological:     Mental Status: She is alert and oriented to person, place, and time.     ED Results / Procedures / Treatments   Labs (all labs ordered are listed, but only abnormal results are displayed) Labs Reviewed  CBC WITH DIFFERENTIAL/PLATELET - Abnormal; Notable for the following components:      Result Value   RBC 2.70 (*)    Hemoglobin 9.3 (*)    HCT 28.7 (*)    MCV 106.3 (*)    MCH 34.4 (*)    RDW 20.3 (*)    All other components within normal limits  CULTURE, BLOOD (ROUTINE X 2)  CULTURE, BLOOD (ROUTINE X 2)  RESP PANEL BY RT-PCR (RSV, FLU A&B, COVID)  [id number]  COMPREHENSIVE METABOLIC PANEL  LACTIC ACID, PLASMA  LACTIC ACID, PLASMA  SEDIMENTATION RATE  C-REACTIVE PROTEIN    EKG EKG Interpretation  Date/Time:  Saturday January 15 2023 12:23:08 EST Ventricular Rate:  76 PR Interval:  140 QRS Duration: 126 QT Interval:  417 QTC Calculation: 469 R Axis:   85 Text Interpretation: Sinus rhythm Right bundle branch block No acute changes twi not new No significant change since last tracing Confirmed by Varney Biles [id number]) on 01/15/2023 1:34:30 PM  Radiology DG Chest Port 1 View  Result Date: 01/15/2023 CLINICAL DATA:  Weakness and pain. EXAM: PORTABLE CHEST 1 VIEW COMPARISON:  11/30/2022 FINDINGS: Telemetry leads overlie the chest. UPPER limits normal heart size again noted. The mediastinal silhouette is unchanged. There is no evidence of focal airspace disease, pulmonary edema, suspicious pulmonary nodule/mass, pleural effusion, or pneumothorax. No acute bony abnormalities are identified. IMPRESSION: No active disease. Electronically Signed   By: Margarette Canada M.D.   On: 01/15/2023 13:17    Procedures Procedures    Medications Ordered in ED Medications  acetaminophen (TYLENOL) tablet 650 mg (has no administration in time range)    ED Course/ Medical Decision Making/ A&P                             Medical Decision Making 72 year old  female with history of ESRD on HD, discitis history comes in with chief complaint of generalized weakness, back pain, anorexia.  Symptoms have been present for the last 2 or 3 days.  Review of system is negative for any URI-like symptoms, abdominal pain.  On exam, patient appears slightly uncomfortable.  Afebrile here.  Does not appear volume overloaded.  Differential diagnosis includes worsening perivertebral swelling, epidural abscess, discitis, compression fracture.  Basic labs have been ordered.  I tried to call patient's family and called both her home number and both of her children, but there was no response.  Patient reassessed at 3:30 PM.  Continues to have discomfort, we will proceed with MRI lumbar  spine without contrast.  If it is negative, patient will be discharged.  Amount and/or Complexity of Data Reviewed Labs: ordered. Radiology: ordered.  Risk OTC drugs.    Final Clinical Impression(s) / ED Diagnoses Final diagnoses:  Chronic midline low back pain without sciatica  Generalized weakness    Rx / DC Orders ED Discharge Orders     None         Varney Biles, MD 01/15/23 1540

## 2023-01-16 DIAGNOSIS — N186 End stage renal disease: Secondary | ICD-10-CM

## 2023-01-16 DIAGNOSIS — M4620 Osteomyelitis of vertebra, site unspecified: Secondary | ICD-10-CM

## 2023-01-16 DIAGNOSIS — J45909 Unspecified asthma, uncomplicated: Secondary | ICD-10-CM | POA: Diagnosis present

## 2023-01-16 DIAGNOSIS — M4646 Discitis, unspecified, lumbar region: Secondary | ICD-10-CM | POA: Diagnosis not present

## 2023-01-16 DIAGNOSIS — N185 Chronic kidney disease, stage 5: Secondary | ICD-10-CM | POA: Diagnosis not present

## 2023-01-16 DIAGNOSIS — Z681 Body mass index (BMI) 19 or less, adult: Secondary | ICD-10-CM | POA: Diagnosis not present

## 2023-01-16 DIAGNOSIS — B9562 Methicillin resistant Staphylococcus aureus infection as the cause of diseases classified elsewhere: Secondary | ICD-10-CM

## 2023-01-16 DIAGNOSIS — Z7951 Long term (current) use of inhaled steroids: Secondary | ICD-10-CM | POA: Diagnosis not present

## 2023-01-16 DIAGNOSIS — E872 Acidosis, unspecified: Secondary | ICD-10-CM | POA: Diagnosis present

## 2023-01-16 DIAGNOSIS — I4892 Unspecified atrial flutter: Secondary | ICD-10-CM

## 2023-01-16 DIAGNOSIS — Z8679 Personal history of other diseases of the circulatory system: Secondary | ICD-10-CM

## 2023-01-16 DIAGNOSIS — R7881 Bacteremia: Secondary | ICD-10-CM | POA: Diagnosis not present

## 2023-01-16 DIAGNOSIS — M8610 Other acute osteomyelitis, unspecified site: Secondary | ICD-10-CM | POA: Diagnosis not present

## 2023-01-16 DIAGNOSIS — G9341 Metabolic encephalopathy: Secondary | ICD-10-CM | POA: Diagnosis present

## 2023-01-16 DIAGNOSIS — Z992 Dependence on renal dialysis: Secondary | ICD-10-CM

## 2023-01-16 DIAGNOSIS — I480 Paroxysmal atrial fibrillation: Secondary | ICD-10-CM | POA: Diagnosis present

## 2023-01-16 DIAGNOSIS — I4891 Unspecified atrial fibrillation: Secondary | ICD-10-CM | POA: Diagnosis not present

## 2023-01-16 DIAGNOSIS — E039 Hypothyroidism, unspecified: Secondary | ICD-10-CM | POA: Diagnosis present

## 2023-01-16 DIAGNOSIS — G8929 Other chronic pain: Secondary | ICD-10-CM | POA: Diagnosis present

## 2023-01-16 DIAGNOSIS — K6812 Psoas muscle abscess: Secondary | ICD-10-CM | POA: Diagnosis present

## 2023-01-16 DIAGNOSIS — I120 Hypertensive chronic kidney disease with stage 5 chronic kidney disease or end stage renal disease: Secondary | ICD-10-CM | POA: Diagnosis present

## 2023-01-16 DIAGNOSIS — Z7901 Long term (current) use of anticoagulants: Secondary | ICD-10-CM | POA: Diagnosis not present

## 2023-01-16 DIAGNOSIS — Z7189 Other specified counseling: Secondary | ICD-10-CM | POA: Diagnosis not present

## 2023-01-16 DIAGNOSIS — M4626 Osteomyelitis of vertebra, lumbar region: Secondary | ICD-10-CM | POA: Diagnosis present

## 2023-01-16 DIAGNOSIS — E43 Unspecified severe protein-calorie malnutrition: Secondary | ICD-10-CM | POA: Diagnosis present

## 2023-01-16 DIAGNOSIS — M8660 Other chronic osteomyelitis, unspecified site: Secondary | ICD-10-CM

## 2023-01-16 DIAGNOSIS — E860 Dehydration: Secondary | ICD-10-CM | POA: Diagnosis present

## 2023-01-16 DIAGNOSIS — Z8249 Family history of ischemic heart disease and other diseases of the circulatory system: Secondary | ICD-10-CM | POA: Diagnosis not present

## 2023-01-16 DIAGNOSIS — B182 Chronic viral hepatitis C: Secondary | ICD-10-CM | POA: Diagnosis present

## 2023-01-16 DIAGNOSIS — R64 Cachexia: Secondary | ICD-10-CM | POA: Diagnosis present

## 2023-01-16 DIAGNOSIS — Z79899 Other long term (current) drug therapy: Secondary | ICD-10-CM | POA: Diagnosis not present

## 2023-01-16 DIAGNOSIS — N2581 Secondary hyperparathyroidism of renal origin: Secondary | ICD-10-CM | POA: Diagnosis present

## 2023-01-16 DIAGNOSIS — Z801 Family history of malignant neoplasm of trachea, bronchus and lung: Secondary | ICD-10-CM | POA: Diagnosis not present

## 2023-01-16 DIAGNOSIS — D631 Anemia in chronic kidney disease: Secondary | ICD-10-CM | POA: Diagnosis present

## 2023-01-16 DIAGNOSIS — Z515 Encounter for palliative care: Secondary | ICD-10-CM | POA: Diagnosis not present

## 2023-01-16 LAB — CBC
HCT: 29 % — ABNORMAL LOW (ref 36.0–46.0)
Hemoglobin: 9.6 g/dL — ABNORMAL LOW (ref 12.0–15.0)
MCH: 34 pg (ref 26.0–34.0)
MCHC: 33.1 g/dL (ref 30.0–36.0)
MCV: 102.8 fL — ABNORMAL HIGH (ref 80.0–100.0)
Platelets: 214 10*3/uL (ref 150–400)
RBC: 2.82 MIL/uL — ABNORMAL LOW (ref 3.87–5.11)
RDW: 19.8 % — ABNORMAL HIGH (ref 11.5–15.5)
WBC: 8.2 10*3/uL (ref 4.0–10.5)
nRBC: 0 % (ref 0.0–0.2)

## 2023-01-16 LAB — BLOOD CULTURE ID PANEL (REFLEXED) - BCID2

## 2023-01-16 LAB — BASIC METABOLIC PANEL
Anion gap: 20 — ABNORMAL HIGH (ref 5–15)
BUN: 54 mg/dL — ABNORMAL HIGH (ref 8–23)
CO2: 23 mmol/L (ref 22–32)
Calcium: 9.2 mg/dL (ref 8.9–10.3)
Chloride: 92 mmol/L — ABNORMAL LOW (ref 98–111)
Creatinine, Ser: 9.31 mg/dL — ABNORMAL HIGH (ref 0.44–1.00)
GFR, Estimated: 4 mL/min — ABNORMAL LOW (ref >60)
Glucose, Bld: 63 mg/dL — ABNORMAL LOW (ref 70–99)
Potassium: 4.4 mmol/L (ref 3.5–5.1)
Sodium: 135 mmol/L (ref 135–145)

## 2023-01-16 MED ORDER — CLONIDINE HCL 0.1 MG PO TABS: 0.1000 mg | ORAL_TABLET | Freq: Four times a day (QID) | ORAL | Status: DC | PRN

## 2023-01-16 MED ORDER — HYDRALAZINE HCL 50 MG PO TABS
50.0000 mg | ORAL_TABLET | ORAL | Status: DC
  Administered 2023-01-17 (×2): 50 mg via ORAL
  Filled 2023-01-16 (×3): qty 1

## 2023-01-16 MED ORDER — METOPROLOL SUCCINATE ER 25 MG PO TB24
50.0000 mg | ORAL_TABLET | Freq: Every day | ORAL | Status: DC
  Administered 2023-01-16 – 2023-01-17 (×2): 50 mg via ORAL
  Filled 2023-01-16 (×2): qty 2

## 2023-01-16 MED ORDER — HYDRALAZINE HCL 50 MG PO TABS
50.0000 mg | ORAL_TABLET | ORAL | Status: DC
  Administered 2023-01-16 – 2023-01-18 (×3): 50 mg via ORAL
  Filled 2023-01-16 (×3): qty 1

## 2023-01-16 MED ORDER — PANTOPRAZOLE SODIUM 40 MG PO TBEC
40.0000 mg | DELAYED_RELEASE_TABLET | Freq: Every day | ORAL | Status: DC
  Administered 2023-01-16 – 2023-01-25 (×8): 40 mg via ORAL
  Filled 2023-01-16 (×8): qty 1

## 2023-01-16 MED ORDER — APIXABAN 2.5 MG PO TABS
2.5000 mg | ORAL_TABLET | Freq: Two times a day (BID) | ORAL | Status: DC
  Administered 2023-01-16 – 2023-01-18 (×6): 2.5 mg via ORAL
  Filled 2023-01-16 (×6): qty 1

## 2023-01-16 MED ORDER — LACTATED RINGERS IV BOLUS
500.0000 mL | Freq: Once | INTRAVENOUS | Status: AC
  Administered 2023-01-16: 500 mL via INTRAVENOUS

## 2023-01-16 MED ORDER — VANCOMYCIN HCL 500 MG/100ML IV SOLN
500.0000 mg | INTRAVENOUS | Status: DC
  Administered 2023-01-17 – 2023-01-19 (×2): 500 mg via INTRAVENOUS
  Filled 2023-01-16 (×5): qty 100

## 2023-01-16 MED ORDER — HEPARIN SODIUM (PORCINE) 5000 UNIT/ML IJ SOLN: 5000.0000 [IU] | Freq: Three times a day (TID) | INTRAMUSCULAR | Status: DC

## 2023-01-16 MED ORDER — SEVELAMER CARBONATE 800 MG PO TABS
800.0000 mg | ORAL_TABLET | Freq: Three times a day (TID) | ORAL | Status: DC
  Administered 2023-01-16 (×2): 800 mg via ORAL
  Filled 2023-01-16 (×3): qty 1

## 2023-01-16 MED ORDER — HYDROCODONE-ACETAMINOPHEN 5-325 MG PO TABS
1.0000 | ORAL_TABLET | ORAL | Status: DC | PRN
  Administered 2023-01-16 – 2023-01-17 (×2): 1 via ORAL
  Administered 2023-01-18 – 2023-01-20 (×3): 2 via ORAL
  Filled 2023-01-16: qty 1
  Filled 2023-01-16 (×3): qty 2
  Filled 2023-01-16: qty 1
  Filled 2023-01-16: qty 2

## 2023-01-16 NOTE — Progress Notes (Signed)
PHARMACY - PHYSICIAN COMMUNICATION CRITICAL VALUE ALERT - BLOOD CULTURE IDENTIFICATION (BCID)  Diamond [name] is an 72 y.o. female who presented to [facility] on 01/15/2023 with a chief complaint of osteomyelitis  Assessment:  1/2 positive blood cultures for MRSA - likely source osteomyelitis.  Name of physician (or Provider) Contacted: Dr. Aileen Fass  Current antibiotics: Vancomycin  Changes to prescribed antibiotics recommended:  Patient is on recommended antibiotics - No changes needed  Results for orders placed or performed during the hospital encounter of 01/15/23  Blood Culture ID Panel (Reflexed) (Collected: 01/15/2023  2:25 PM)  Result Value Ref Range   Enterococcus faecalis NOT DETECTED NOT DETECTED   Enterococcus Faecium NOT DETECTED NOT DETECTED   Listeria monocytogenes NOT DETECTED NOT DETECTED   Staphylococcus species DETECTED (A) NOT DETECTED   Staphylococcus aureus (BCID) DETECTED (A) NOT DETECTED   Staphylococcus epidermidis NOT DETECTED NOT DETECTED   Staphylococcus lugdunensis NOT DETECTED NOT DETECTED   Streptococcus species NOT DETECTED NOT DETECTED   Streptococcus agalactiae NOT DETECTED NOT DETECTED   Streptococcus pneumoniae NOT DETECTED NOT DETECTED   Streptococcus pyogenes NOT DETECTED NOT DETECTED   A.calcoaceticus-baumannii NOT DETECTED NOT DETECTED   Bacteroides fragilis NOT DETECTED NOT DETECTED   Enterobacterales NOT DETECTED NOT DETECTED   Enterobacter cloacae complex NOT DETECTED NOT DETECTED   Escherichia coli NOT DETECTED NOT DETECTED   Klebsiella aerogenes NOT DETECTED NOT DETECTED   Klebsiella oxytoca NOT DETECTED NOT DETECTED   Klebsiella pneumoniae NOT DETECTED NOT DETECTED   Proteus species NOT DETECTED NOT DETECTED   Salmonella species NOT DETECTED NOT DETECTED   Serratia marcescens NOT DETECTED NOT DETECTED   Haemophilus influenzae NOT DETECTED NOT DETECTED   Neisseria meningitidis NOT DETECTED NOT DETECTED   Pseudomonas aeruginosa NOT  DETECTED NOT DETECTED   Stenotrophomonas maltophilia NOT DETECTED NOT DETECTED   Candida albicans NOT DETECTED NOT DETECTED   Candida auris NOT DETECTED NOT DETECTED   Candida glabrata NOT DETECTED NOT DETECTED   Candida krusei NOT DETECTED NOT DETECTED   Candida parapsilosis NOT DETECTED NOT DETECTED   Candida tropicalis NOT DETECTED NOT DETECTED   Cryptococcus neoformans/gattii NOT DETECTED NOT DETECTED   Meth resistant mecA/C and MREJ DETECTED (A) NOT [place] 01/16/2023  4:30 PM

## 2023-01-16 NOTE — Assessment & Plan Note (Addendum)
-  continue Eliquis -Rate is controlled on metoprilol sucinate 50 mg daily. This was not initiallycontinued as inpatient due to the patient's bradycardia on presentation. Will continue metoprolol tartrate at 25 mg bid for now due to HR in the 110's despite IV amiodarone. The patient is also somewhat hypertensive. - Monitor on telemetry. 01/21/2023: patient converted back to sinus overnight. IV amiodarone converted to oral amiodarone.

## 2023-01-16 NOTE — Assessment & Plan Note (Addendum)
Due to acute infection and missed dialysis. Leel of alertness appears to be improved, however the patient is still not able to consent for TEE. The daughter Diamond [name] or son Diamond [name] will consent for that procedure.

## 2023-01-16 NOTE — Evaluation (Signed)
Physical Therapy Evaluation Patient Details Name: Diamond [name] MRN: [id number] DOB: 11-02-1951 Today's Date: 01/16/2023  History of Present Illness  Diamond [name] is an 72 y.o. who presented to ED with worsening back pain and weakness. MRI of the L-spine today showed worsening of persistent finding for osteomyelitis and discitis at L2-3, L4-5. 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   Clinical Impression  Pt admitted with above. Pt was ambulating short distances in the house with RW and used w/c for community mobility until recently when back pain worsened. Pt needed some help with lower body dressing and bathing as well. Pt's son present to provide history and encourage mother to participate in therapy to get stronger however back pain limited pt ability to tolerate sitting EOB and inhibiting transfer OOB to chair. Recommending SNF at this time as pt currently requiring maxA for mobility and ADLs and needs to be at a minA level for safe d/c home. Acute PT to cont to follow.       Recommendations for follow up therapy are one component of a multi-disciplinary discharge planning process, led by the attending physician.  Recommendations may be updated based on patient status, additional functional criteria and insurance authorization.  Follow Up Recommendations Skilled nursing-short term rehab (<3 hours/day) Can patient physically be transported by private vehicle: No    Assistance Recommended at Discharge Frequent or constant Supervision/Assistance  Patient can return home with the following  A lot of help with walking and/or transfers;A lot of help with bathing/dressing/bathroom;Direct supervision/assist for medications management;Assist for transportation;Help with stairs or ramp for entrance    Equipment  Recommendations None recommended by PT  Recommendations for Other Services       Functional Status Assessment Patient has had a recent decline in their functional status and/or demonstrates limited ability to make significant improvements in function in a reasonable and predictable amount of time     Precautions / Restrictions Precautions Precautions: Fall Restrictions Weight Bearing Restrictions: No      Mobility  Bed Mobility Overal bed mobility: Needs Assistance Bed Mobility: Sidelying to Sit, Sit to Supine   Sidelying to sit: Max assist   Sit to supine: Max assist   General bed mobility comments: constant verbal and tactile cues with maxA for trunk elevation and for LE management    Transfers                   General transfer comment: unable to attempt due to pain    Ambulation/Gait                  Stairs            Wheelchair Mobility    Modified Rankin (Stroke Patients Only)       Balance Overall balance assessment: Needs assistance Sitting-balance support: Feet supported, Bilateral upper extremity supported Sitting balance-Leahy Scale: Poor Sitting balance - Comments: pt trying to return to supine, increased back pain with sitting Postural control: Posterior lean                                   Pertinent Vitals/Pain Pain Assessment Pain Assessment: 0-10 Pain Score: 6  Pain Location: back Pain Descriptors / Indicators: Sore Pain Intervention(s): Monitored during session    Home Living Family/patient expects to  be discharged to:: Private residence Living Arrangements: Children Available Help at Discharge: Family;Available 24 hours/day (personal aide M-F in evening) Type of Home: House Home Access: Ramped entrance       Home Layout: One level Home Equipment: Conservation officer, nature (2 wheels);Shower seat;Grab bars - tub/shower;Wheelchair - manual;Hospital bed Additional Comments: son present to help with report     Prior Function Prior Level of Function : Needs assist             Mobility Comments: uses RW in [employer], minA for transfers, w/c for long distance ADLs Comments: can bath with set up, assist with transfers, assist with dressing     Hand Dominance   Dominant Hand: Right    Extremity/Trunk Assessment   Upper Extremity Assessment Upper Extremity Assessment: Generalized weakness    Lower Extremity Assessment Lower Extremity Assessment: Generalized weakness    Cervical / Trunk Assessment Cervical / Trunk Assessment: Kyphotic  Communication   Communication: No difficulties  Cognition Arousal/Alertness: Awake/alert Behavior During Therapy: Anxious, Flat affect Overall Cognitive Status: Impaired/Different from baseline Area of Impairment: Orientation, Attention, Following commands, Safety/judgement, Awareness, Problem solving                 Orientation Level: Disoriented to, Place, Time, Situation Current Attention Level: Focused   Following Commands: Follows one step commands with increased time Safety/Judgement: Decreased awareness of safety, Decreased awareness of deficits Awareness: Intellectual Problem Solving: Slow processing, Decreased initiation, Difficulty sequencing, Requires verbal cues, Requires tactile cues General Comments: pt became more sleepy t/o attempted PT eval however son feels pt was self limiting and not wanting to participate. Pt initially stating she wants to walk again but then when PT began assisting pt to EOB pt resistant stating "I don't want to. I dont' want to." pt with poor to no initiation of tasks        General Comments General comments (skin integrity, edema, etc.): VSS    Exercises Other Exercises Other Exercises: attempted to engage pt in supine AA bilat LE ROM however pt unable to tend to task and required constant verbal and tactile cues to stay engaged and participate   Assessment/Plan    PT Assessment Patient needs  continued PT services  PT Problem List Decreased strength;Decreased activity tolerance;Decreased range of motion;Decreased balance;Decreased mobility;Decreased coordination;Decreased knowledge of use of DME;Decreased safety awareness;Pain       PT Treatment Interventions DME instruction;Gait training;Functional mobility training;Therapeutic activities;Therapeutic exercise;Balance training;Neuromuscular re-education    PT Goals (Current goals can be found in the Care Plan section)  Acute Rehab PT Goals Patient Stated Goal: to walk PT Goal Formulation: With patient/family Time For Goal Achievement: 01/30/23 Potential to Achieve Goals: Fair    Frequency Min 3X/week     Co-evaluation               AM-PAC PT "6 Clicks" Mobility  Outcome Measure Help needed turning from your back to your side while in a flat bed without using bedrails?: A Lot Help needed moving from lying on your back to sitting on the side of a flat bed without using bedrails?: A Lot Help needed moving to and from a bed to a chair (including a wheelchair)?: Total Help needed standing up from a chair using your arms (e.g., wheelchair or bedside chair)?: Total Help needed to walk in hospital room?: Total Help needed climbing 3-5 steps with a railing? : Total 6 Click Score: 8    End of Session   Activity Tolerance: Patient limited  by pain Patient left: in bed;with call bell/phone within reach;with bed alarm set;with family/visitor present Nurse Communication: Mobility status PT Visit Diagnosis: Unsteadiness on feet (R26.81);Muscle weakness (generalized) (M62.81);Difficulty in walking, not elsewhere classified (R26.2)    Time: [id number] PT Time Calculation (min) (ACUTE ONLY): 24 min   Charges:   PT Evaluation $PT Eval Moderate Complexity: 1 Mod PT Treatments $Therapeutic Activity: 8-22 mins        Kittie Plater, PT, DPT Acute Rehabilitation Services Secure chat preferred Office #:  [phone]   Berline Lopes 01/16/2023, 12:59 PM

## 2023-01-16 NOTE — Assessment & Plan Note (Addendum)
Anion gap continues to be elevated at 16.

## 2023-01-16 NOTE — Consult Note (Addendum)
Date of Admission:  01/15/2023          Reason for Consult: MRSA  bacteremia (recurrent) with ? hx of endocarditis and worsening Lumbar disktiis, vertebral osteomyelitis     Referring Provider: Charlynne Cousins, MD   Assessment:  Recurrent MRSA bacteremia--was not YET known when I was first called re the patient or saw her Persistent oseomyelitis, diskitis at L2-3 and L4-5 with worsening paravertebralinflammatory changes  Psoas phlegmon on way to becoming abscess ESRD on HD via fistula Hx of endocarditis  Plan:  Continue vancomycin Repeat blood cultures  TTE and then  TEE Would ask [facility] to make sure no evidence of infecition of fistula Will need repeat MRI in 1-2 weeks to see IF and when phlegmon becomes abscess that IR can target Would retreat with 8 weeks of vancomcycin followed by LIFELONG anti MRSA oral therapy  ESR, CRP    Principal Problem:   Acute on chronic osteomyelitis ([place]) Active Problems:   Hypertension   ESRD on hemodialysis (HCC)   Paroxysmal atrial flutter (H[facility])   Acute metabolic encephalopathy   Metabolic acidosis   Scheduled Meds:  apixaban  2.5 mg Oral BID   [START ON 01/17/2023] hydrALAZINE  50 mg Oral 3 times per day on Mon Wed Fri   And   hydrALAZINE  50 mg Oral 2 times per day on Sun Tue Thu Sat   metoprolol succinate  50 mg Oral Daily   pantoprazole  40 mg Oral Daily   sevelamer carbonate  800 mg Oral TID WC   Continuous Infusions:  [START ON 01/17/2023] vancomycin     PRN Meds:.HYDROcodone-acetaminophen  HPI: Diamond [name] is a 72 y.o. female with ESRD on HD currently through fistula who has had multiple episodes or MRSA bacteremia with January 2023, July 2023, October 2023 and now February 2024 who has had lumbar diskitis osteomyelitis, rightly presumed to be due to MRSA who has received multiple rounds of IV and then po antibiotics. She had been in the hospital in December with fever and negative workup then more recently  seen by my partner Dr. Gale Journey on January 05, 2023 and suppressive bactrim stopped. In the interval she had worsening of her low back pain to the point she could not get out of bed.  She came to ER where MRI done showed;     IMPRESSION: 1. Persistent findings concerning for osteomyelitis discitis at L2-3 and L4-5 with slight interval worsening in paravertebral inflammatory changes as compared to previous MRI from 11/30/2022. Superimposed 2.3 cm heterogeneous area of involving the left psoas muscle at the level of L3 likely reflects phlegmon. No drainable fluid collections identified. 2. Underlying moderate multilevel spondylosis and facet arthrosis with moderate to severe canal and lateral recess narrowing at L2-3 through L5-S1 as above. Appearance is not significantly changed as compared to prior.  I was called by ER MD and I recommended they obtain blood cultures and start vancomycin. Those blood cultures are now yet again + for MRSA.  I had already proposed to patient and her son protracted oral therapy for years before I got to read her history more thoroughly and now that she is bacteremic again it is clear to me that she needs LIFELONG suppressive therapy  There is mention of endocarditis though I do not see clear cut endocarditis described in the chart--where there is mention of echodensity in RVOT and RA but I do not see mention of endocarditis.  I  spent110  minutes with the patient including than 50% of the time in face to face counseling of the patient and her son re her MRSA bacteremia, disktis,vertebral osteomyelitis  personally reviewing MRI L spine along with review of medical records in preparation for the visit and during the visit and in coordination of her care.      Review of Systems: Review of Systems  Constitutional:  Positive for malaise/fatigue. Negative for chills, fever and weight loss.  HENT:  Negative for congestion and sore throat.   Eyes:  Negative for  blurred vision and photophobia.  Respiratory:  Negative for cough, shortness of breath and wheezing.   Cardiovascular:  Negative for chest pain, palpitations and leg swelling.  Gastrointestinal:  Negative for abdominal pain, blood in stool, constipation, diarrhea, heartburn, melena, nausea and vomiting.  Genitourinary:  Negative for dysuria, flank pain and hematuria.  Musculoskeletal:  Positive for back pain. Negative for falls, joint pain and myalgias.  Skin:  Negative for itching and rash.  Neurological:  Negative for dizziness, focal weakness, loss of consciousness, weakness and headaches.  Endo/Heme/Allergies:  Does not bruise/bleed easily.  Psychiatric/Behavioral:  Negative for depression and suicidal ideas. The patient does not have insomnia.     Past Medical History:  Diagnosis Date   Anemia    Asthma    Atrial flutter with rapid ventricular response ([place]) 12/25/2021   Chronic kidney disease    dialysis Tues Thurs Sat   Gout    Hypertension     Social History   Tobacco Use   Smoking status: Never    Passive exposure: Never   Smokeless tobacco: Never  Vaping Use   Vaping Use: Never used  Substance Use Topics   Alcohol use: No   Drug use: No    Family History  Problem Relation Age of Onset   Hypertension Mother    Hypertension Father    Colon cancer Brother    Lung cancer Brother    Allergies  Allergen Reactions   Shrimp (Diagnostic) Other (See Comments)    Unknown reaction   Other Swelling and Rash    Anesthesia - facial swelling, rash    OBJECTIVE: Blood pressure (!) 170/99, pulse 77, temperature 99.6 F (37.6 C), temperature source Oral, resp. rate 16, height 5' 6"$  (1.676 m), weight 52.8 kg, SpO2 99 %.  Physical Exam Constitutional:      General: She is not in acute distress.    Appearance: Normal appearance. She is well-developed. She is not ill-appearing or diaphoretic.  HENT:     Head: Normocephalic and atraumatic.     Right Ear: Hearing and  external ear normal.     Left Ear: Hearing and external ear normal.     Nose: No nasal deformity or rhinorrhea.  Eyes:     General: No scleral icterus.    Conjunctiva/sclera: Conjunctivae normal.     Right eye: Right conjunctiva is not injected.     Left eye: Left conjunctiva is not injected.     Pupils: Pupils are equal, round, and reactive to light.  Neck:     Vascular: No JVD.  Cardiovascular:     Rate and Rhythm: Normal rate and regular rhythm.     Heart sounds: Normal heart sounds, S1 normal and S2 normal. No murmur heard.    No friction rub.  Abdominal:     General: Bowel sounds are normal. There is no distension.     Palpations: Abdomen is soft.     Tenderness:  There is no abdominal tenderness.  Musculoskeletal:        General: Normal range of motion.     Right shoulder: Normal.     Left shoulder: Normal.     Cervical back: Normal range of motion and neck supple.     Right hip: Normal.     Left hip: Normal.     Right knee: Normal.     Left knee: Normal.  Lymphadenopathy:     Head:     Right side of head: No submandibular, preauricular or posterior auricular adenopathy.     Left side of head: No submandibular, preauricular or posterior auricular adenopathy.     Cervical: No cervical adenopathy.     Right cervical: No superficial or deep cervical adenopathy.    Left cervical: No superficial or deep cervical adenopathy.  Skin:    General: Skin is warm and dry.     Coloration: Skin is not pale.     Findings: No abrasion, bruising, ecchymosis, erythema, lesion or rash.     Nails: There is no clubbing.  Neurological:     Mental Status: She is alert and oriented to person, place, and time.     Sensory: No sensory deficit.     Coordination: Coordination normal.     Gait: Gait normal.  Psychiatric:        Attention and Perception: She is attentive.        Speech: Speech normal.        Behavior: Behavior normal. Behavior is cooperative.        Thought Content: Thought  content normal.        Judgment: Judgment normal.     Lab Results Lab Results  Component Value Date   WBC 8.2 01/16/2023   HGB 9.6 (L) 01/16/2023   HCT 29.0 (L) 01/16/2023   MCV 102.8 (H) 01/16/2023   PLT 214 01/16/2023    Lab Results  Component Value Date   CREATININE 9.31 (H) 01/16/2023   BUN 54 (H) 01/16/2023   NA 135 01/16/2023   K 4.4 01/16/2023   CL 92 (L) 01/16/2023   CO2 23 01/16/2023    Lab Results  Component Value Date   ALT 6 01/15/2023   AST 31 01/15/2023   ALKPHOS 52 01/15/2023   BILITOT 1.0 01/15/2023     Microbiology: Recent Results (from the past 240 hour(s))  Blood culture (routine x 2)     Status: None (Preliminary result)   Collection Time: 01/15/23  2:25 PM   Specimen: BLOOD  Result Value Ref Range Status   Specimen Description BLOOD SITE NOT SPECIFIED  Final   Special Requests   Final    BOTTLES DRAWN AEROBIC AND ANAEROBIC Blood Culture adequate volume   Culture  Setup Time   Final    ANAEROBIC BOTTLE ONLY GRAM POSITIVE COCCI IN CLUSTERS Organism ID to follow CRITICAL RESULT CALLED TO, READ BACK BY AND VERIFIED WITH:  C/ PHARMD C. PIERCE 01/16/23 1620 A. LAFRANCE Performed at [facility], [place] [address], [place], [place] [address]    Culture GRAM POSITIVE COCCI  Final   Report Status PENDING  Incomplete  Blood Culture ID Panel (Reflexed)     Status: Abnormal   Collection Time: 01/15/23  2:25 PM  Result Value Ref Range Status   Enterococcus faecalis NOT DETECTED NOT DETECTED Final   Enterococcus Faecium NOT DETECTED NOT DETECTED Final   Listeria monocytogenes NOT DETECTED NOT DETECTED Final   Staphylococcus species DETECTED (A)  NOT DETECTED Final    Comment: CRITICAL RESULT CALLED TO, READ BACK BY AND VERIFIED WITH:  C/ PHARMD C. PIERCE 01/16/23 1620 A. LAFRANCE    Staphylococcus aureus (BCID) DETECTED (A) NOT DETECTED Final    Comment: Methicillin (oxacillin)-resistant Staphylococcus aureus (MRSA). MRSA is predictably resistant  to beta-lactam antibiotics (except ceftaroline). Preferred therapy is vancomycin unless clinically contraindicated. Patient requires contact precautions if  hospitalized. CRITICAL RESULT CALLED TO, READ BACK BY AND VERIFIED WITH:  C/ PHARMD C. PIERCE 01/16/23 1620 A. LAFRANCE    Staphylococcus epidermidis NOT DETECTED NOT DETECTED Final   Staphylococcus lugdunensis NOT DETECTED NOT DETECTED Final   Streptococcus species NOT DETECTED NOT DETECTED Final   Streptococcus agalactiae NOT DETECTED NOT DETECTED Final   Streptococcus pneumoniae NOT DETECTED NOT DETECTED Final   Streptococcus pyogenes NOT DETECTED NOT DETECTED Final   A.calcoaceticus-baumannii NOT DETECTED NOT DETECTED Final   Bacteroides fragilis NOT DETECTED NOT DETECTED Final   Enterobacterales NOT DETECTED NOT DETECTED Final   Enterobacter cloacae complex NOT DETECTED NOT DETECTED Final   Escherichia coli NOT DETECTED NOT DETECTED Final   Klebsiella aerogenes NOT DETECTED NOT DETECTED Final   Klebsiella oxytoca NOT DETECTED NOT DETECTED Final   Klebsiella pneumoniae NOT DETECTED NOT DETECTED Final   Proteus species NOT DETECTED NOT DETECTED Final   Salmonella species NOT DETECTED NOT DETECTED Final   Serratia marcescens NOT DETECTED NOT DETECTED Final   Haemophilus influenzae NOT DETECTED NOT DETECTED Final   Neisseria meningitidis NOT DETECTED NOT DETECTED Final   Pseudomonas aeruginosa NOT DETECTED NOT DETECTED Final   Stenotrophomonas maltophilia NOT DETECTED NOT DETECTED Final   Candida albicans NOT DETECTED NOT DETECTED Final   Candida auris NOT DETECTED NOT DETECTED Final   Candida glabrata NOT DETECTED NOT DETECTED Final   Candida krusei NOT DETECTED NOT DETECTED Final   Candida parapsilosis NOT DETECTED NOT DETECTED Final   Candida tropicalis NOT DETECTED NOT DETECTED Final   Cryptococcus neoformans/gattii NOT DETECTED NOT DETECTED Final   Meth resistant mecA/C and MREJ DETECTED (A) NOT DETECTED Final    Comment:  CRITICAL RESULT CALLED TO, READ BACK BY AND VERIFIED WITH:  C/ PHARMD C. PIERCE 01/16/23 1620 A. LAFRANCE Performed at [facility], [place] [address] [place], [place] [address]     Alcide Evener, [place] [facility] [phone] pager  01/16/2023, 4:57 PM

## 2023-01-16 NOTE — Progress Notes (Signed)
[facility] PROGRESS NOTE    Progress Note  Diamond STEPANIAK  [id number] DOB: [birth year] DOA: 01/15/2023 PCP: [facility] [place]     Brief Narrative:   Diamond [name] is an 72 y.o. 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  MRI of the L-spine today showed persistent finding for osteomyelitis and discitis slightly worsened compared to previous MRI.  CRP is 33 ESR is 133  Assessment/Plan:   Persistent Acute on chronic osteomyelitis (HCC)/history of bacteremia: MRI of the lumbar spine showed persistent discitis L2-L3 and L4-5 with inflammatory changes compared to previous MRI superimposed 2.3 cm to the [place] area involving the left psoas muscles at the level of L3 likely a phlegmon no drainable fluid collection identified. Infectious disease was curb sided blood cultures were ordered. She was started on IV vancomycin and ID to see in the morning.  Acute metabolic encephalopathy Due to infectious etiology will continue to monitor clinically. Vitals are stable except for her blood pressure was slightly elevated. Remained afebrile with no leukocytosis. Seems to have been resolved.  End-stage renal disease on hemodialysis: Renal has been notified.  Paroxysmal atrial fibrillation Continue Eliquis rate control.  Essential hypertension: Restart clonidine and metoprolol.  Metabolic acidosis Probably secondary to worsening renal function and missed dialysis renal has been notified. She was given a liter of normal saline in the ED bicarb is improved.   DVT prophylaxis: lovenox Family Communication:non Status is: Inpatient Remains inpatient appropriate because: Acute on chronic  osteomyelitis    Code Status:     Code Status Orders  (From admission, onward)           Start     Ordered   01/16/23 0041  Full code  Continuous       Question:  By:  Answer:  Consent: discussion documented in EHR   01/16/23 0041           Code Status History     Date Active Date Inactive Code Status Order ID Comments User Context   11/30/2022 0416 12/08/2022 1911 Full Code [id number]  Idamae Schuller, MD ED   09/17/2022 0159 10/02/2022 0618 Full Code [id number]  Etta Quill, DO ED   07/26/2022 1656 07/30/2022 0040 Full Code [id number]  Karmen Bongo, MD ED   06/12/2022 2354 06/30/2022 2214 Full Code [id number]  Kayleen Memos, DO ED   02/05/2022 1857 02/06/2022 2316 Full Code [id number]  Orma Flaming, MD ED   01/16/2022 2258 01/22/2022 2041 Full Code [id number]  Shalhoub, Sherryll Burger, MD ED   12/17/2021 0259 12/30/2021 2132 Full Code [id number]  Howerter, Ethelda Chick, DO ED   11/05/2019 2206 11/13/2019 0127 Full Code [id number]  Toy Baker, MD Inpatient   10/17/2019 0444 10/21/2019 1629 Full Code [id number]  Reubin Milan, MD ED   10/16/2019 2110 10/17/2019 0444 Full Code [id number]  Reubin Milan, MD ED         IV Access:   Peripheral IV   Procedures and diagnostic studies:   MR LUMBAR SPINE WO CONTRAST  Result Date: 01/15/2023 CLINICAL DATA:  Initial evaluation for worsened lower back pain. EXAM: MRI LUMBAR SPINE WITHOUT CONTRAST TECHNIQUE: Multiplanar, multisequence MR imaging of the lumbar spine was performed. No intravenous contrast was administered. COMPARISON:  Previous MRI from 11/30/2022. FINDINGS: Segmentation: Transitional lumbosacral anatomy with  sacralization of L5. Same numbering system employed as on previous exam. Alignment: Stable trace anterolisthesis of L1 on L2 and L3 on L4. Underlying mild scoliosis. Vertebrae: Persistent endplate irregularity with decreased T1 signal intensity, and marrow edema about the L2-3 and L4-5 interspaces. Similar intra  discal fluid signal intensity at L2-3 and L3-4. Underlying chronic L2 and L3 compression deformities, stable. No acute or interval fracture. Bone marrow signal intensity heterogeneous without worrisome osseous lesion. Conus medullaris and cauda equina: Conus extends to the L1 level. Conus and cauda equina appear normal. Soft tissue material within the ventral epidural space at L3 suspicious for phlegmon, similar to prior. Paraspinal and other soft tissues: Paravertebral edema and inflammatory changes seen involving the left greater than right psoas musculature. Overall changes are slightly worsened from prior. Superimposed heterogeneous T2 hypointense area measuring 2.3 cm within the left psoas muscle is increased from prior, likely phlegmon. No drainable soft tissue fluid collections. Renal atrophy with multiple bilateral simple cysts, consistent with history of end-stage renal disease. No follow-up imaging recommended. Atherosclerotic change noted within the aorta. Disc levels: L1-2: Diffuse disc bulge with endplate spurring. Mild facet hypertrophy. Unchanged mild narrowing of the lateral recesses. Central canal remains patent. Mild left L1 foraminal narrowing. Right neural foramina remains patent. L2-3: Mild disc bulge. Mild to moderate facet hypertrophy with ligament flavum thickening. Resultant mild-to-moderate canal with bilateral subarticular stenosis. Mild bilateral L2 foraminal narrowing. Appearance is relatively stable. L3-4: Diffuse disc bulge. Moderate facet and ligament flavum hypertrophy. Superimposed epidural phlegmon within the ventral epidural space at L3. Resultant severe spinal stenosis. Mild to moderate left with mild right L3 foraminal narrowing. Appearance is stable. L4-5: Severe intervertebral disc space narrowing with diffuse disc osteophyte complex. Superimposed small central disc protrusion with caudad angulation. Right worse than left facet hypertrophy. Moderate spinal stenosis with  moderate to severe bilateral lateral recess narrowing, stable. Mild-to-moderate left L4 foraminal narrowing. Right neural foramen remains patent. L5-S1: Transitional segment. No disc bulge or focal disc herniation. No stenosis. IMPRESSION: 1. Persistent findings concerning for osteomyelitis discitis at L2-3 and L4-5 with slight interval worsening in paravertebral inflammatory changes as compared to previous MRI from 11/30/2022. Superimposed 2.3 cm heterogeneous area of involving the left psoas muscle at the level of L3 likely reflects phlegmon. No drainable fluid collections identified. 2. Underlying moderate multilevel spondylosis and facet arthrosis with moderate to severe canal and lateral recess narrowing at L2-3 through L5-S1 as above. Appearance is not significantly changed as compared to prior. Electronically Signed   By: Jeannine Boga M.D.   On: 01/15/2023 21:59   DG Chest Port 1 View  Result Date: 01/15/2023 CLINICAL DATA:  Weakness and pain. EXAM: PORTABLE CHEST 1 VIEW COMPARISON:  11/30/2022 FINDINGS: Telemetry leads overlie the chest. UPPER limits normal heart size again noted. The mediastinal silhouette is unchanged. There is no evidence of focal airspace disease, pulmonary edema, suspicious pulmonary nodule/mass, pleural effusion, or pneumothorax. No acute bony abnormalities are identified. IMPRESSION: No active disease. Electronically Signed   By: Margarette Canada M.D.   On: 01/15/2023 13:17     Medical Consultants:   None.   Subjective:    Diamond [name] awake no complaints does not want to be bothered  Objective:    Vitals:   01/15/23 2345 01/16/23 0115 01/16/23 0145 01/16/23 0540  BP: 107/88 (!) 180/97  (!) 173/96  Pulse: 77 81  81  Resp: 18 19    Temp:    98 F (36.7 C)  TempSrc:  SpO2: 100% 100%  100%  Weight:   52.8 kg   Height:   5' 6"$  (1.676 m)    SpO2: 100 %   Intake/Output Summary (Last 24 hours) at 01/16/2023 0717 Last data filed at 01/16/2023  0158 Gross per 24 hour  Intake 320.94 ml  Output --  Net 320.94 ml   Filed Weights   01/16/23 0145  Weight: 52.8 kg    Exam: General exam: In no acute distress. Respiratory system: Good air movement and clear to auscultation. Cardiovascular system: S1 & S2 heard, RRR. No JVD. Gastrointestinal system: Abdomen is nondistended, soft and nontender.  Extremities: No pedal edema. Skin: No rashes, lesions or ulcers Psychiatry: Judgement and insight appear normal. Mood & affect appropriate.    Data Reviewed:    Labs: Basic Metabolic Panel: Recent Labs  Lab 01/15/23 1218 01/16/23 0112  NA 136 135  K 5.0 4.4  CL 92* 92*  CO2 27 23  GLUCOSE 82 63*  BUN 46* 54*  CREATININE 8.76* 9.31*  CALCIUM 9.4 9.2   GFR Estimated Creatinine Clearance: 4.5 mL/min (A) (by C-G formula based on SCr of 9.31 mg/dL (H)). Liver Function Tests: Recent Labs  Lab 01/15/23 1218  AST 31  ALT 6  ALKPHOS 52  BILITOT 1.0  PROT 7.0  ALBUMIN 2.6*   No results for input(s): "LIPASE", "AMYLASE" in the last 168 hours. No results for input(s): "AMMONIA" in the last 168 hours. Coagulation profile No results for input(s): "INR", "PROTIME" in the last 168 hours. COVID-19 Labs  Recent Labs    01/15/23 1218  CRP 33.8*    Lab Results  Component Value Date   SARSCOV2NAA NEGATIVE 11/29/2022   SARSCOV2NAA NEGATIVE 06/12/2022   SARSCOV2NAA NEGATIVE 01/16/2022   [place] NEGATIVE 12/28/2021    CBC: Recent Labs  Lab 01/15/23 1218 01/16/23 0112  WBC 8.2 8.2  NEUTROABS 6.8  --   HGB 9.3* 9.6*  HCT 28.7* 29.0*  MCV 106.3* 102.8*  PLT 246 214   Cardiac Enzymes: No results for input(s): "CKTOTAL", "CKMB", "CKMBINDEX", "TROPONINI" in the last 168 hours. BNP (last 3 results) No results for input(s): "PROBNP" in the last 8760 hours. CBG: No results for input(s): "GLUCAP" in the last 168 hours. D-Dimer: No results for input(s): "DDIMER" in the last 72 hours. Hgb A1c: No results for  input(s): "HGBA1C" in the last 72 hours. Lipid Profile: No results for input(s): "CHOL", "HDL", "LDLCALC", "TRIG", "CHOLHDL", "LDLDIRECT" in the last 72 hours. Thyroid function studies: No results for input(s): "TSH", "T4TOTAL", "T3FREE", "THYROIDAB" in the last 72 hours.  Invalid input(s): "FREET3" Anemia work up: No results for input(s): "VITAMINB12", "FOLATE", "FERRITIN", "TIBC", "IRON", "RETICCTPCT" in the last 72 hours. Sepsis Labs: Recent Labs  Lab 01/15/23 1218 01/15/23 1219 01/16/23 0112  WBC 8.2  --  8.2  LATICACIDVEN  --  1.9  --    Microbiology No results found for this or any previous visit (from the past 240 hour(s)).   Medications:    apixaban  2.5 mg Oral BID   Continuous Infusions:  [START ON 01/17/2023] vancomycin        LOS: 0 days   Charlynne Cousins  [facility]  01/16/2023, 7:17 AM

## 2023-01-16 NOTE — Progress Notes (Signed)
Pharmacy Antibiotic Note  Diamond [name] is a 72 y.o. female admitted on 01/15/2023 with  osteomyelitis/discitis (slight worsening from previous MRI) .  Pharmacy has been consulted for Vancomycin dosing. Recent history of the same with MRSA bacteremia. WBC WNL. ESRD on HD MWF (recently changed from TTS).   Plan: Vancomycin 1250 mg IV x 1, then 500 mg IV qHD on MWF Trend WBC, temp, HD schedule  F/U infectious work-up/repeat cultures  Drug levels as indicated   Temp (24hrs), Avg:99.2 F (37.3 C), Min:98.9 F (37.2 C), Max:99.4 F (37.4 C)  Recent Labs  Lab 01/15/23 1218 01/15/23 1219  WBC 8.2  --   CREATININE 8.76*  --   LATICACIDVEN  --  1.9    CrCl cannot be calculated (Unknown ideal weight.).    Allergies  Allergen Reactions   Shrimp (Diagnostic) Other (See Comments)    Unknown reaction   Other Swelling and Rash    Anesthesia - facial swelling, rash   Narda Bonds, PharmD, BCPS Clinical Pharmacist Phone: [phone]

## 2023-01-16 NOTE — Assessment & Plan Note (Addendum)
-  Pt tells me her days are MWF but per documentation it is Tues/Thurs/Sat. Cannot recall her last dialysis session. Creatinine is elevated to 8.3 with previous around 4-6 suggestive that she may have missed a session. She may need unscheduled dialysis tomorrow. Need nephrology consult in the morning.

## 2023-01-16 NOTE — ED Notes (Signed)
ED TO INPATIENT HANDOFF REPORT  ED Nurse Name and Phone #: [phone]  S Name/Age/Gender Diamond [name] 72 y.o. female Room/Bed: 016C/016C  Code Status   Code Status: Prior  Home/SNF/Other Home Patient oriented to: self, place, time, and situation Is this baseline? Yes  Pt is alert and oriented but forgetful   Triage Complete: Triage complete  Chief Complaint Acute on chronic osteomyelitis ([place]) [M86.10, M86.60]  Triage Note PT BIB EMS due to weakness, back pain, and not eating for 2 days. Pt is non complaint with dialysis. Pt last had a full tx of Wednsday. Axox4, uncooperative.    Allergies Allergies  Allergen Reactions   Shrimp (Diagnostic) Other (See Comments)    Unknown reaction   Other Swelling and Rash    Anesthesia - facial swelling, rash    Level of Care/Admitting Diagnosis ED Disposition     ED Disposition  Admit   Condition  --   Comment  Hospital Area: [place] [id number]  Level of Care: Telemetry Medical [104]  May admit patient to [facility] or [facility] if equivalent level of care is available:: No  Covid Evaluation: Asymptomatic - no recent exposure (last 10 days) testing not required  Diagnosis: Acute on chronic osteomyelitis [id number]  Admitting Physician: Orene Desanctis [id number]  Attending Physician: Orene Desanctis [id number]  Certification:: I certify this patient will need inpatient services for at least 2 midnights  Estimated Length of Stay: 3          B Medical/Surgery History Past Medical History:  Diagnosis Date   Anemia    Asthma    Atrial flutter with rapid ventricular response ([place]) 12/25/2021   Chronic kidney disease    dialysis Tues Thurs Sat   Gout    Hypertension    Past Surgical History:  Procedure Laterality Date   AV FISTULA PLACEMENT Left 12/23/2021   Procedure: LEFT ARM BRACHIOBASILIC VEIN ARTERIOVENOUS (AV) FISTULA CREATION;  Surgeon: [name], MD;  Location: [place];  Service:  Vascular;  Laterality: Left;   [place] Left 03/08/2022   Procedure: LEFT SECOND STAGE BASILIC VEIN TRANSPOSITION;  Surgeon: [name], MD;  Location: [place];  Service: Vascular;  Laterality: Left;   BIOPSY  01/19/2022   Procedure: BIOPSY;  Surgeon: Lavena Bullion, DO;  Location: [place];  Service: Gastroenterology;;   [place] N/A 06/25/2022   Procedure: OPEN CHOLECYSTECTOMY;  Surgeon: Dwan Bolt, MD;  Location: [place];  Service: General;  Laterality: N/A;   COLONOSCOPY Left 01/19/2022   Procedure: COLONOSCOPY;  Surgeon: Lavena Bullion, DO;  Location: [place];  Service: Gastroenterology;  Laterality: Left;   ESOPHAGOGASTRODUODENOSCOPY Left 01/19/2022   Procedure: ESOPHAGOGASTRODUODENOSCOPY (EGD);  Surgeon: Lavena Bullion, DO;  Location: [facility] ENDOSCOPY;  Service: Gastroenterology;  Laterality: Left;   INSERTION OF DIALYSIS CATHETER Right 12/23/2021   Procedure: INSERTION OF DIALYSIS CATHETER;  Surgeon: [name], MD;  Location: [place];  Service: Vascular;  Laterality: Right;   LAPAROSCOPY N/A 06/25/2022   Procedure: STAGING LAPAROSCOPY;  Surgeon: Dwan Bolt, MD;  Location: [place];  Service: General;  Laterality: N/A;   OPEN PARTIAL HEPATECTOMY  N/A 06/25/2022   Procedure: OPEN PARTIAL HEPATECTOMY;  Surgeon: Dwan Bolt, MD;  Location: [place];  Service: General;  Laterality: N/A;   POLYPECTOMY  01/19/2022   Procedure: POLYPECTOMY;  Surgeon: Lavena Bullion, DO;  Location: [place];  Service: Gastroenterology;;  TEE WITHOUT CARDIOVERSION N/A 12/22/2021   Procedure: TRANSESOPHAGEAL ECHOCARDIOGRAM (TEE);  Surgeon: Jerline Pain, MD;  Location: [facility] ENDOSCOPY;  Service: Cardiovascular;  Laterality: N/A;   TEE WITHOUT CARDIOVERSION N/A 06/17/2022   Procedure: TRANSESOPHAGEAL ECHOCARDIOGRAM (TEE);  Surgeon: Buford Dresser, MD;  Location: [facility] ENDOSCOPY;  Service: Cardiovascular;  Laterality: N/A;    ULTRASOUND GUIDANCE FOR VASCULAR ACCESS  12/23/2021   Procedure: ULTRASOUND GUIDANCE FOR VASCULAR ACCESS;  Surgeon: [name], MD;  [facility];  Service: Vascular;;     A IV Location/Drains/Wounds Patient Lines/Drains/Airways Status     Active Line/Drains/Airways     Name Placement date Placement time Site Days   Peripheral IV 01/15/23 22 G 1.75" Anterior;Right Forearm 01/15/23  1438  Forearm  1   Fistula / Graft Left Forearm Arteriovenous fistula 12/23/21  1646  Forearm  389            Intake/Output Last 24 hours No intake or output data in the 24 hours ending 01/16/23 0031  Labs/Imaging Results for orders placed or performed during the hospital encounter of 01/15/23 (from the past 48 hour(s))  CBC with Differential     Status: Abnormal   Collection Time: 01/15/23 12:18 PM  Result Value Ref Range   WBC 8.2 4.0 - 10.5 K/uL   RBC 2.70 (L) 3.87 - 5.11 MIL/uL   Hemoglobin 9.3 (L) 12.0 - 15.0 g/dL   HCT 28.7 (L) 36.0 - 46.0 %   MCV 106.3 (H) 80.0 - 100.0 fL   MCH 34.4 (H) 26.0 - 34.0 pg   MCHC 32.4 30.0 - 36.0 g/dL   RDW 20.3 (H) 11.5 - 15.5 %   Platelets 246 150 - 400 K/uL   nRBC 0.0 0.0 - 0.2 %   Neutrophils Relative % 83 %   Neutro Abs 6.8 1.7 - 7.7 K/uL   Lymphocytes Relative 8 %   Lymphs Abs 0.7 0.7 - 4.0 K/uL   Monocytes Relative 8 %   Monocytes Absolute 0.6 0.1 - 1.0 K/uL   Eosinophils Relative 0 %   Eosinophils Absolute 0.0 0.0 - 0.5 K/uL   Basophils Relative 0 %   Basophils Absolute 0.0 0.0 - 0.1 K/uL   Immature Granulocytes 1 %   Abs Immature Granulocytes 0.04 0.00 - 0.07 K/uL    Comment: Performed at [facility], [address] [place], [place] [address]  Comprehensive metabolic panel     Status: Abnormal   Collection Time: 01/15/23 12:18 PM  Result Value Ref Range   Sodium 136 135 - 145 mmol/L   Potassium 5.0 3.5 - 5.1 mmol/L   Chloride 92 (L) 98 - 111 mmol/L   CO2 27 22 - 32 mmol/L   Glucose, Bld 82 70 - 99 mg/dL    Comment:  Glucose reference range applies only to samples taken after fasting for at least 8 hours.   BUN 46 (H) 8 - 23 mg/dL   Creatinine, Ser 8.76 (H) 0.44 - 1.00 mg/dL   Calcium 9.4 8.9 - 10.3 mg/dL   Total Protein 7.0 6.5 - 8.1 g/dL   Albumin 2.6 (L) 3.5 - 5.0 g/dL   AST 31 15 - 41 U/L   ALT 6 0 - 44 U/L   Alkaline Phosphatase 52 38 - 126 U/L   Total Bilirubin 1.0 0.3 - 1.2 mg/dL   GFR, Estimated 4 (L) >60 mL/min    Comment: (NOTE) Calculated using the CKD-EPI Creatinine Equation (2021)    Anion gap 17 (H) 5 -  15    Comment: Performed at [facility], [place] [address] [place], [place] [address]  Sedimentation rate     Status: Abnormal   Collection Time: 01/15/23 12:18 PM  Result Value Ref Range   Sed Rate 133 (H) 0 - 22 mm/hr    Comment: Performed at [facility] [address] [place], [place] [address]  C-reactive protein     Status: Abnormal   Collection Time: 01/15/23 12:18 PM  Result Value Ref Range   CRP 33.8 (H) <1.0 mg/dL    Comment: Performed at [facility] [address], [place], [place] [address]  Lactic acid, plasma     Status: None   Collection Time: 01/15/23 12:19 PM  Result Value Ref Range   Lactic Acid, Venous 1.9 0.5 - 1.9 mmol/L    Comment: Performed at [facility] [address], [place], [place] [address]   MR LUMBAR SPINE WO CONTRAST  Result Date: 01/15/2023 CLINICAL DATA:  Initial evaluation for worsened lower back pain. EXAM: MRI LUMBAR SPINE WITHOUT CONTRAST TECHNIQUE: Multiplanar, multisequence MR imaging of the lumbar spine was performed. No intravenous contrast was administered. COMPARISON:  Previous MRI from 11/30/2022. FINDINGS: Segmentation: Transitional lumbosacral anatomy with sacralization of L5. Same numbering system employed as on previous exam. Alignment: Stable trace anterolisthesis of L1 on L2 and L3 on L4. Underlying mild scoliosis. Vertebrae: Persistent endplate irregularity with decreased T1 signal intensity, and  marrow edema about the L2-3 and L4-5 interspaces. Similar intra discal fluid signal intensity at L2-3 and L3-4. Underlying chronic L2 and L3 compression deformities, stable. No acute or interval fracture. Bone marrow signal intensity heterogeneous without worrisome osseous lesion. Conus medullaris and cauda equina: Conus extends to the L1 level. Conus and cauda equina appear normal. Soft tissue material within the ventral epidural space at L3 suspicious for phlegmon, similar to prior. Paraspinal and other soft tissues: Paravertebral edema and inflammatory changes seen involving the left greater than right psoas musculature. Overall changes are slightly worsened from prior. Superimposed heterogeneous T2 hypointense area measuring 2.3 cm within the left psoas muscle is increased from prior, likely phlegmon. No drainable soft tissue fluid collections. Renal atrophy with multiple bilateral simple cysts, consistent with history of end-stage renal disease. No follow-up imaging recommended. Atherosclerotic change noted within the aorta. Disc levels: L1-2: Diffuse disc bulge with endplate spurring. Mild facet hypertrophy. Unchanged mild narrowing of the lateral recesses. Central canal remains patent. Mild left L1 foraminal narrowing. Right neural foramina remains patent. L2-3: Mild disc bulge. Mild to moderate facet hypertrophy with ligament flavum thickening. Resultant mild-to-moderate canal with bilateral subarticular stenosis. Mild bilateral L2 foraminal narrowing. Appearance is relatively stable. L3-4: Diffuse disc bulge. Moderate facet and ligament flavum hypertrophy. Superimposed epidural phlegmon within the ventral epidural space at L3. Resultant severe spinal stenosis. Mild to moderate left with mild right L3 foraminal narrowing. Appearance is stable. L4-5: Severe intervertebral disc space narrowing with diffuse disc osteophyte complex. Superimposed small central disc protrusion with caudad angulation. Right worse  than left facet hypertrophy. Moderate spinal stenosis with moderate to severe bilateral lateral recess narrowing, stable. Mild-to-moderate left L4 foraminal narrowing. Right neural foramen remains patent. L5-S1: Transitional segment. No disc bulge or focal disc herniation. No stenosis. IMPRESSION: 1. Persistent findings concerning for osteomyelitis discitis at L2-3 and L4-5 with slight interval worsening in paravertebral inflammatory changes as compared to previous MRI from 11/30/2022. Superimposed 2.3 cm heterogeneous area of involving the left psoas muscle at the level  of L3 likely reflects phlegmon. No drainable fluid collections identified. 2. Underlying moderate multilevel spondylosis and facet arthrosis with moderate to severe canal and lateral recess narrowing at L2-3 through L5-S1 as above. Appearance is not significantly changed as compared to prior. Electronically Signed   By: Jeannine Boga M.D.   On: 01/15/2023 21:59   DG Chest Port 1 View  Result Date: 01/15/2023 CLINICAL DATA:  Weakness and pain. EXAM: PORTABLE CHEST 1 VIEW COMPARISON:  11/30/2022 FINDINGS: Telemetry leads overlie the chest. UPPER limits normal heart size again noted. The mediastinal silhouette is unchanged. There is no evidence of focal airspace disease, pulmonary edema, suspicious pulmonary nodule/mass, pleural effusion, or pneumothorax. No acute bony abnormalities are identified. IMPRESSION: No active disease. Electronically Signed   By: Margarette Canada M.D.   On: 01/15/2023 13:17    Pending Labs Unresulted Labs (From admission, onward)     Start     Ordered   01/15/23 1219  Blood culture (routine x 2)  BLOOD CULTURE X 2,   R,   Status:  Canceled     Question Answer Comment  Patient immune status Normal   Release to patient Immediate      01/15/23 1218            Vitals/Pain Today's Vitals   01/15/23 2000 01/15/23 2015 01/15/23 2045 01/15/23 2143  BP: (!) 148/98 (!) 155/85 (!) 162/93   Pulse: 77 74 74    Resp: 18 (!) 23 17   Temp:    98.9 F (37.2 C)  TempSrc:    Oral  SpO2: 100% 99% 99%   PainSc:        Isolation Precautions Airborne and Contact precautions  Medications Medications  vancomycin (VANCOREADY) IVPB 1250 mg/250 mL (1,250 mg Intravenous New Bag/Given 01/16/23 0007)  acetaminophen (TYLENOL) tablet 650 mg (650 mg Oral Given 01/15/23 1629)    Mobility walks with device     Focused Assessments Neuro Assessment Handoff:  Swallow screen pass? Yes          Neuro Assessment:   Neuro Checks:      Has TPA been given? No If patient is a Neuro Trauma and patient is going to OR before floor call report to [place] nurse: [phone] or [phone]   R Recommendations: See Admitting Provider Note  Report given to:   Additional Notes:

## 2023-01-16 NOTE — Assessment & Plan Note (Addendum)
-  hx of recurrent MRSA bacteremia with lumbar discitis/osteomyelitis on approximately 4-5 months of antibiotics here with worsening discitis at L2-3 and L4-5 as well developing phlegmon at L3 and left psoas muscle. ESR and CPR both elevated. -follows with ID Dr. Gale Journey outpatient and last on Bactrim on 01/05/2023.  -blood cultures are pending -Continue IV Vancomycin as per ID.

## 2023-01-16 NOTE — Assessment & Plan Note (Addendum)
-  Metoprolol has been increased to 50 mg tartrate bid  Monitor on telemetry. The patient is somewhat hypertensive currently.

## 2023-01-17 ENCOUNTER — Inpatient Hospital Stay (HOSPITAL_COMMUNITY): Payer: Medicare (Managed Care)

## 2023-01-17 DIAGNOSIS — M4620 Osteomyelitis of vertebra, site unspecified: Secondary | ICD-10-CM | POA: Diagnosis not present

## 2023-01-17 DIAGNOSIS — R7881 Bacteremia: Secondary | ICD-10-CM | POA: Diagnosis not present

## 2023-01-17 DIAGNOSIS — G9341 Metabolic encephalopathy: Secondary | ICD-10-CM | POA: Diagnosis not present

## 2023-01-17 DIAGNOSIS — N185 Chronic kidney disease, stage 5: Secondary | ICD-10-CM

## 2023-01-17 DIAGNOSIS — N186 End stage renal disease: Secondary | ICD-10-CM | POA: Diagnosis not present

## 2023-01-17 DIAGNOSIS — M8610 Other acute osteomyelitis, unspecified site: Secondary | ICD-10-CM | POA: Diagnosis not present

## 2023-01-17 LAB — RENAL FUNCTION PANEL
Albumin: 2.1 g/dL — ABNORMAL LOW (ref 3.5–5.0)
Anion gap: 19 — ABNORMAL HIGH (ref 5–15)
BUN: 76 mg/dL — ABNORMAL HIGH (ref 8–23)
CO2: 23 mmol/L (ref 22–32)
Calcium: 9.1 mg/dL (ref 8.9–10.3)
Chloride: 93 mmol/L — ABNORMAL LOW (ref 98–111)
Creatinine, Ser: 10.98 mg/dL — ABNORMAL HIGH (ref 0.44–1.00)
GFR, Estimated: 3 mL/min — ABNORMAL LOW (ref >60)
Glucose, Bld: 126 mg/dL — ABNORMAL HIGH (ref 70–99)
Phosphorus: 8.3 mg/dL — ABNORMAL HIGH (ref 2.5–4.6)
Potassium: 4.2 mmol/L (ref 3.5–5.1)
Sodium: 135 mmol/L (ref 135–145)

## 2023-01-17 LAB — CBC
HCT: 27.9 % — ABNORMAL LOW (ref 36.0–46.0)
Hemoglobin: 9.6 g/dL — ABNORMAL LOW (ref 12.0–15.0)
MCH: 34.3 pg — ABNORMAL HIGH (ref 26.0–34.0)
MCHC: 34.4 g/dL (ref 30.0–36.0)
MCV: 99.6 fL (ref 80.0–100.0)
Platelets: 329 10*3/uL (ref 150–400)
RBC: 2.8 MIL/uL — ABNORMAL LOW (ref 3.87–5.11)
RDW: 20.6 % — ABNORMAL HIGH (ref 11.5–15.5)
WBC: 8.8 10*3/uL (ref 4.0–10.5)
nRBC: 0 % (ref 0.0–0.2)

## 2023-01-17 LAB — ECHOCARDIOGRAM COMPLETE
AR max vel: 2.25 cm2
AV Area VTI: 2.1 cm2
AV Area mean vel: 2.15 cm2
AV Mean grad: 5 mmHg
AV Peak grad: 8.9 mmHg
Ao pk vel: 1.49 m/s
Area-P 1/2: 4.31 cm2
Calc EF: 64.8 %
Height: 66 in
MV M vel: 2.59 m/s
MV Peak grad: 26.8 mmHg
S' Lateral: 3.95 cm
Single Plane A2C EF: 63 %
Single Plane A4C EF: 63.8 %
Weight: 1862.45 oz

## 2023-01-17 LAB — HEPATITIS B SURFACE ANTIGEN: Hepatitis B Surface Ag: NONREACTIVE

## 2023-01-17 MED ORDER — ALTEPLASE 2 MG IJ SOLR: 2.0000 mg | Freq: Once | INTRAMUSCULAR | Status: DC | PRN

## 2023-01-17 MED ORDER — ALBUMIN HUMAN 25 % IV SOLN: 25.0000 g | Freq: Once | INTRAVENOUS | Status: AC

## 2023-01-17 MED ORDER — PENTAFLUOROPROP-TETRAFLUOROETH EX AERO: 1.0000 | INHALATION_SPRAY | CUTANEOUS | Status: DC | PRN

## 2023-01-17 MED ORDER — SEVELAMER CARBONATE 800 MG PO TABS
1600.0000 mg | ORAL_TABLET | Freq: Three times a day (TID) | ORAL | Status: DC
  Administered 2023-01-18 – 2023-01-25 (×14): 1600 mg via ORAL
  Filled 2023-01-17 (×16): qty 2

## 2023-01-17 MED ORDER — HEPARIN SODIUM (PORCINE) 1000 UNIT/ML DIALYSIS
1000.0000 [IU] | INTRAMUSCULAR | Status: DC | PRN
  Filled 2023-01-17: qty 1

## 2023-01-17 MED ORDER — LIDOCAINE-PRILOCAINE 2.5-2.5 % EX CREA
1.0000 | TOPICAL_CREAM | CUTANEOUS | Status: DC | PRN
  Filled 2023-01-17: qty 5

## 2023-01-17 MED ORDER — CHLORHEXIDINE GLUCONATE CLOTH 2 % EX PADS
6.0000 | MEDICATED_PAD | Freq: Every day | CUTANEOUS | Status: DC
  Administered 2023-01-17 – 2023-01-26 (×9): 6 via TOPICAL

## 2023-01-17 MED ORDER — CLONIDINE HCL 0.1 MG PO TABS
0.1000 mg | ORAL_TABLET | Freq: Four times a day (QID) | ORAL | Status: DC | PRN
  Administered 2023-01-24: 0.1 mg via ORAL
  Filled 2023-01-17: qty 1

## 2023-01-17 MED ORDER — METOPROLOL TARTRATE 5 MG/5ML IV SOLN
INTRAVENOUS | Status: AC
  Filled 2023-01-17: qty 5

## 2023-01-17 MED ORDER — LIDOCAINE HCL (PF) 1 % IJ SOLN: 5.0000 mL | INTRAMUSCULAR | Status: DC | PRN

## 2023-01-17 MED ORDER — ACETAMINOPHEN 650 MG RE SUPP
650.0000 mg | Freq: Once | RECTAL | Status: AC
  Administered 2023-01-17: 650 mg via RECTAL
  Filled 2023-01-17: qty 1

## 2023-01-17 MED ORDER — ALBUMIN HUMAN 25 % IV SOLN
INTRAVENOUS | Status: AC
  Administered 2023-01-17: 25 g
  Filled 2023-01-17: qty 100

## 2023-01-17 MED ORDER — METOPROLOL TARTRATE 5 MG/5ML IV SOLN
5.0000 mg | Freq: Four times a day (QID) | INTRAVENOUS | Status: DC | PRN
  Administered 2023-01-17 – 2023-01-19 (×5): 5 mg via INTRAVENOUS
  Filled 2023-01-17 (×4): qty 5

## 2023-01-17 NOTE — Evaluation (Signed)
Occupational Therapy Evaluation Patient Details Name: Diamond [name] MRN: [id number] DOB: 03/21/1951 Today's Date: 01/17/2023   History of Present Illness Diamond [name] is an 72 y.o. who presented to ED with worsening back pain and weakness. MRI of the L-spine today showed worsening of persistent finding for osteomyelitis and discitis at L2-3, L4-5. 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   Clinical Impression   Pt presents with decline in function and safety with ADLs and ADL mobility with impaired strength, balance, endurance; pt limited by back pain. PTA pt lived at home with her son and was ambulating short distances in the house with RW and used w/c for community mobility until recently when back pain worsened. Pt reports that she has an aide due to needing some help with lower body dressing and bathing as well. Pt currently required max A to sit EOB with Poor sitting balance, max A with UB ADLs and total A with LB ADLs and toileting at bed level. Unable to safety attempt sit - stand/SPTs at this time due to pain and fatigue. Pt would benefit from acute OT services to address impairments to maximize level of function and safety     Recommendations for follow up therapy are one component of a multi-disciplinary discharge planning process, led by the attending physician.  Recommendations may be updated based on patient status, additional functional criteria and insurance authorization.   Follow Up Recommendations  Skilled nursing-short term rehab (<3 hours/day)     Assistance Recommended at Discharge Frequent or constant Supervision/Assistance  Patient can return home with the following A lot of help with bathing/dressing/bathroom;A lot of help with walking and/or transfers;Help with stairs or ramp for  entrance    Functional Status Assessment  Patient has had a recent decline in their functional status and demonstrates the ability to make significant improvements in function in a reasonable and predictable amount of time.  Equipment Recommendations  Other (comment) (TBD at next venue of care)    Recommendations for Other Services       Precautions / Restrictions Precautions Precautions: Fall Restrictions Weight Bearing Restrictions: No      Mobility Bed Mobility Overal bed mobility: Needs Assistance Bed Mobility: Sidelying to Sit, Sit to Supine   Sidelying to sit: Max assist   Sit to supine: Max assist   General bed mobility comments: constant verbal and tactile cues required for initiation, attention and task continuation. Max A for trunk elevation and for LE management    Transfers                   General transfer comment: unable to attempt due to pain      Balance Overall balance assessment: Needs assistance Sitting-balance support: Feet supported, Bilateral upper extremity supported Sitting balance-Leahy Scale: Poor Sitting balance - Comments: pt trying to return to supine, increased back pain with sitting Postural control: Posterior lean                                 ADL either performed or assessed with clinical judgement   ADL Overall ADL's : Needs assistance/impaired     Grooming: Wash/dry hands;Wash/dry face;Minimal assistance;Sitting   Upper Body Bathing: Maximal assistance   Lower Body Bathing: Total assistance   Upper Body Dressing : Maximal assistance   Lower  Body Dressing: Total assistance     Toilet Transfer Details (indicate cue type and reason): unable to attempt Toileting- Clothing Manipulation and Hygiene: Total assistance;Bed level         General ADL Comments: pt with Poor sitting tolerance due to back pain, fatigue. Unable to safely attempt SPTs to [facility] at this time     Vision Baseline  Vision/History: 1 Wears glasses Patient Visual Report: No change from baseline       Perception     Praxis      Pertinent Vitals/Pain Pain Assessment Pain Assessment: 0-10 Pain Score: 6  Pain Location: back Pain Descriptors / Indicators: Sore, Aching Pain Intervention(s): Limited activity within patient's tolerance, Monitored during session, Premedicated before session, Repositioned     Hand Dominance Right   Extremity/Trunk Assessment Upper Extremity Assessment Upper Extremity Assessment: Generalized weakness   Lower Extremity Assessment Lower Extremity Assessment: Defer to PT evaluation   Cervical / Trunk Assessment Cervical / Trunk Assessment: Kyphotic   Communication Communication Communication: No difficulties   Cognition Arousal/Alertness: Awake/alert Behavior During Therapy: Anxious, Flat affect Overall Cognitive Status: Impaired/Different from baseline Area of Impairment: Orientation, Attention, Following commands, Safety/judgement, Awareness, Problem solving                 Orientation Level: Disoriented to, Place, Time, Situation     Following Commands: Follows one step commands with increased time Safety/Judgement: Decreased awareness of safety, Decreased awareness of deficits   Problem Solving: Slow processing, Decreased initiation, Difficulty sequencing, Requires verbal cues, Requires tactile cues       General Comments       Exercises     Shoulder Instructions      Home Living Family/patient expects to be discharged to:: Private residence Living Arrangements: Children Available Help at Discharge: Family;Available 24 hours/day Type of Home: House Home Access: Ramped entrance     Home Layout: One level     Bathroom Shower/Tub: Occupational psychologist: Standard Bathroom Accessibility: Yes   Home Equipment: Conservation officer, nature (2 wheels);Shower seat;Grab bars - tub/shower;Wheelchair - manual;Hospital bed          Prior  Functioning/Environment Prior Level of Function : Needs assist             Mobility Comments: uses RW in [employer], minA for transfers, w/c for long distance ADLs Comments: can bathe with set up, assist with transfers, assist with dressing. Pt reports that she has assist from as aide 3-4 hrs/day        OT Problem List: Decreased strength;Decreased activity tolerance;Pain;Decreased coordination;Impaired balance (sitting and/or standing);Decreased cognition      OT Treatment/Interventions: Self-care/ADL training;Therapeutic exercise;Neuromuscular education;DME and/or AE instruction;Patient/family education;Balance training    OT Goals(Current goals can be found in the care plan section) Acute Rehab OT Goals Patient Stated Goal: "I just want to rest, I can't do it" OT Goal Formulation: With patient Time For Goal Achievement: 01/31/23 Potential to Achieve Goals: Fair ADL Goals Pt Will Perform Grooming: with min guard assist;with supervision;sitting Pt Will Perform Upper Body Bathing: with mod assist;with min assist;sitting Pt Will Perform Upper Body Dressing: with mod assist;with min assist;sitting Pt Will Transfer to Toilet: with max assist;with mod assist;stand pivot transfer;bedside commode  OT Frequency: Min 2X/week    Co-evaluation              AM-PAC OT "6 Clicks" Daily Activity     Outcome Measure Help from another person eating meals?: None Help from another person taking  care of personal grooming?: A Little Help from another person toileting, which includes using toliet, bedpan, or urinal?: Total Help from another person bathing (including washing, rinsing, drying)?: A Lot Help from another person to put on and taking off regular upper body clothing?: A Lot Help from another person to put on and taking off regular lower body clothing?: Total 6 Click Score: 13   End of Session    Activity Tolerance: Patient limited by pain;Patient limited by fatigue Patient left:  in bed;with call bell/phone within reach;with bed alarm set  OT Visit Diagnosis: Other abnormalities of gait and mobility (R26.89);Muscle weakness (generalized) (M62.81);Pain;Other symptoms and signs involving cognitive function Pain - part of body:  (back)                Time: [id number] OT Time Calculation (min): 19 min Charges:  OT General Charges $OT Visit: 1 Visit OT Evaluation $OT Eval Moderate Complexity: 1 Mod    Britt Bottom 01/17/2023, 1:03 PM

## 2023-01-17 NOTE — NC FL2 (Signed)
[place] LEVEL OF CARE FORM     IDENTIFICATION  Patient Name: Diamond [name] Birthdate: 12/19/1950 Sex: female Admission Date (Current Location): 01/15/2023  [facility] and [place] Number:  Herbalist and Address:  [facility] [place], [place] [address], [place], [place] [address]      Provider Number: [id number]  Attending Physician Name and Address:  Charlynne Cousins, MD  Relative Name and Phone Number:  [place], [place])  [phone] [facility] Phone)    Current Level of Care: Hospital Recommended Level of Care: [place] Prior Approval Number:    Date Approved/Denied:   PASRR Number: [id number]  Discharge Plan: SNF    Current Diagnoses: Patient Active Problem List   Diagnosis Date Noted   Acute on chronic osteomyelitis ([place]) 99991111   Metabolic acidosis 99991111   Chronic osteomyelitis of lumbar spine ([place]) 12/08/2022   Acute cough 12/07/2022   Fever in adult 12/02/2022   Altered mental status 11/30/2022   AMS (altered mental status) 11/30/2022   PAF (paroxysmal atrial fibrillation) ([place])    Thyroid nodule 09/17/2022   Empty sella ([place]) 09/17/2022   Discitis of lumbosacral region 07/26/2022   Hypertensive urgency 07/21/2022   Bacteremia 06/30/2022   Malnutrition of moderate degree 06/22/2022   Physical deconditioning 06/18/2022   Pressure injury of skin 06/16/2022   MRSA bacteremia 06/13/2022   ESRD on dialysis 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 seen on TEE on 1/17 12/24/2021   Lung nodule seen on imaging study 12/23/2021   Vertebral osteomyelitis ([place])    Sepsis due to methicillin susceptible Staphylococcus aureus ([place]) 12/17/2021   Lower back pain 12/17/2021   ESRD on hemodialysis ([place]) 12/17/2021   Encounter for screening for other viral diseases 11/03/2021   Allergy, unspecified, initial encounter 09/14/2021   Coagulation defect, unspecified ([place]) 99991111   Complication of vascular dialysis catheter 09/14/2021   Gout due to renal impairment, right ankle and foot 09/14/2021   Iron deficiency anemia, unspecified 09/14/2021   Pain, unspecified 09/14/2021   Pruritus, unspecified 09/14/2021   Secondary hyperparathyroidism of renal origin ([place]) 09/14/2021   Shortness of breath 09/14/2021   Unspecified protein-calorie malnutrition ([place]) 09/14/2021   Vitamin D deficiency 01/02/2021   Hyperparathyroidism ([place]) 12/31/2020   CKD (chronic kidney disease), stage III ([place]) 11/10/2019   Cardiomegaly 11/05/2019   Hyperkalemia 11/05/2019   Hypokalemia 10/18/2019   Pneumonia due to COVID-19 virus 10/17/2019   Hypertension    Asthma    Gout    Hyponatremia    Multifactorial anemia-acute blood loss from bleeding HD catheter site-superimposed on anemia related to ESRD.     Orientation RESPIRATION BLADDER Height & Weight     Self, Place  Normal Incontinent Weight: 116 lb 6.5 oz (52.8 kg) Height:  5' 6"$  (167.6 cm)  BEHAVIORAL SYMPTOMS/MOOD NEUROLOGICAL BOWEL NUTRITION STATUS      Continent Diet (see d/c summary)  AMBULATORY STATUS COMMUNICATION OF NEEDS Skin   Extensive Assist Verbally Normal                       Personal Care Assistance Level of Assistance  Bathing, Feeding, Dressing Bathing Assistance: Maximum assistance Feeding assistance: Independent Dressing Assistance: Maximum assistance     Functional  Limitations Info  Sight, Hearing, Speech Sight Info: Adequate Hearing Info: Adequate Speech Info: Adequate    SPECIAL CARE FACTORS FREQUENCY  PT (By licensed PT), OT (By licensed OT)     PT Frequency: 5x/week OT Frequency: 5x/week            Contractures      Additional Factors Info  Code Status, Allergies Code Status Info: full code Allergies Info: shrimp (diagnostic), Other(Anesthesia - facial swelling, rash)           Current Medications (01/17/2023):  This is the current hospital active medication list Current Facility-Administered Medications  Medication Dose Route Frequency Provider Last Rate Last Admin   apixaban (ELIQUIS) tablet 2.5 mg  2.5 mg Oral BID Tu, Ching T, DO   2.5 mg at 01/17/23 1058   Chlorhexidine Gluconate Cloth 2 % PADS 6 each  6 each Topical Q0600 Adelfa Koh, NP   6 each at 01/17/23 1058   cloNIDine (CATAPRES) tablet 0.1 mg  0.1 mg Oral Q6H PRN Charlynne Cousins, MD       hydrALAZINE (APRESOLINE) tablet 50 mg  50 mg Oral 3 times per day on Mon Wed Fri Charlynne Cousins, MD   50 mg at 01/17/23 1057   And   hydrALAZINE (APRESOLINE) tablet 50 mg  50 mg Oral 2 times per day on Sun Tue Thu Sat Charlynne Cousins, MD   50 mg at 01/16/23 2154   HYDROcodone-acetaminophen (NORCO/VICODIN) 5-325 MG per tablet 1-2 tablet  1-2 tablet Oral Q4H PRN Charlynne Cousins, MD   1 tablet at 01/17/23 1102   metoprolol succinate (TOPROL-XL) 24 hr tablet 50 mg  50 mg Oral Daily Charlynne Cousins, MD   50 mg at 01/17/23 1058   pantoprazole (PROTONIX) EC tablet 40 mg  40 mg Oral Daily Charlynne Cousins, MD   40 mg at 01/17/23 1058   sevelamer carbonate (RENVELA) tablet 1,600 mg  1,600 mg Oral TID WC Elmarie Shiley, MD       vancomycin (VANCOREADY) IVPB 500 mg/100 mL  500 mg Intravenous Q M,W,F-HD Erenest Blank, [facility]         Discharge Medications: Please see discharge summary for a list of discharge medications.  Relevant Imaging Results:  Relevant  Lab Results:   Additional Information SS#: [id number] OPHD at [place], [place]

## 2023-01-17 NOTE — Consult Note (Addendum)
Hospital Consult    Reason for Consult:  evaluate fistula Requesting Physician:  Aileen Fass MRN #:  [id number]  History of Present Illness: This is a 72 y.o. female who presented to the hospital with worsening lower back pain over the last week.  She has not had any lower extremity weakness, bowel or bladder incontinence or fever.     Pt has hx of ESRD with dialysis on T/T/S.  She has hx of left 1st stage BVT on 12/23/2021 and 2nd stage left BVT 03/08/2022 both by Dr. Stanford Breed.    Pt has hx of recurrent MRSA bacteremia and is followed by Dr. Tommy Medal.  Pt has hx of persistent osteomyelitis, diskitis at L2-L3 and L4-L5 with worsening paravertebral inflammatory changes, psoas phlegmon on way to becoming abscess and hx of endocarditis.  She is on IV abx, getting repeat blood cx, TTE and TEE.  VVS is asked to evaluate fistula to make sure it is not source of infection.    She denies any trouble with her fistula.  When asked if it is working well she says yes.  She denies any pain in her hand.  She states she does not have any sores on her feet.    Pt also has hx of PAF on Eliquis, HTN, chronic hepatitis C, asthma, hypoparathyroidism.   The pt is not on a statin for cholesterol management.  The pt is not on a daily aspirin.   Other AC:  Eliquis The pt is on BB, clonidine for hypertension.   The pt is not diabetic.   Tobacco hx:  never  Past Medical History:  Diagnosis Date   Anemia    Asthma    Atrial flutter with rapid ventricular response ([place]) 12/25/2021   Chronic kidney disease    dialysis Tues Thurs Sat   Gout    Hypertension     Past Surgical History:  Procedure Laterality Date   AV FISTULA PLACEMENT Left 12/23/2021   Procedure: LEFT ARM BRACHIOBASILIC VEIN ARTERIOVENOUS (AV) FISTULA CREATION;  Surgeon: Cherre , MD;  Location: [place];  Service: Vascular;  Laterality: Left;   [place] Left 03/08/2022   Procedure: LEFT SECOND STAGE BASILIC VEIN  TRANSPOSITION;  Surgeon: Cherre , MD;  [facility];  Service: Vascular;  Laterality: Left;   BIOPSY  01/19/2022   Procedure: BIOPSY;  Surgeon: Lavena Bullion, DO;  Location: [place] ENDOSCOPY;  Service: Gastroenterology;;   [place] N/A 06/25/2022   Procedure: OPEN CHOLECYSTECTOMY;  Surgeon: Dwan Bolt, MD;  Location: [place];  Service: General;  Laterality: N/A;   COLONOSCOPY Left 01/19/2022   Procedure: COLONOSCOPY;  Surgeon: Lavena Bullion, DO;  Location: [place];  Service: Gastroenterology;  Laterality: Left;   ESOPHAGOGASTRODUODENOSCOPY Left 01/19/2022   Procedure: ESOPHAGOGASTRODUODENOSCOPY (EGD);  Surgeon: Lavena Bullion, DO;  Location: [facility] ENDOSCOPY;  Service: Gastroenterology;  Laterality: Left;   INSERTION OF DIALYSIS CATHETER Right 12/23/2021   Procedure: INSERTION OF DIALYSIS CATHETER;  Surgeon: Cherre , MD;  Location: [place];  Service: Vascular;  Laterality: Right;   LAPAROSCOPY N/A 06/25/2022   Procedure: STAGING LAPAROSCOPY;  Surgeon: Dwan Bolt, MD;  Location: [place];  Service: General;  Laterality: N/A;   OPEN PARTIAL HEPATECTOMY  N/A 06/25/2022   Procedure: OPEN PARTIAL HEPATECTOMY;  Surgeon: Dwan Bolt, MD;  Location: [place];  Service: General;  Laterality: N/A;   POLYPECTOMY  01/19/2022   Procedure: POLYPECTOMY;  Surgeon: Bryan Lemma,  Dominic Pea, DO;  Location: [place] ENDOSCOPY;  Service: Gastroenterology;;   TEE WITHOUT CARDIOVERSION N/A 12/22/2021   Procedure: TRANSESOPHAGEAL ECHOCARDIOGRAM (TEE);  Surgeon: Jerline Pain, MD;  Location: [facility] ENDOSCOPY;  Service: Cardiovascular;  Laterality: N/A;   TEE WITHOUT CARDIOVERSION N/A 06/17/2022   Procedure: TRANSESOPHAGEAL ECHOCARDIOGRAM (TEE);  Surgeon: Buford Dresser, MD;  Location: [place];  Service: Cardiovascular;  Laterality: N/A;   ULTRASOUND GUIDANCE FOR VASCULAR ACCESS  12/23/2021   Procedure: ULTRASOUND GUIDANCE FOR VASCULAR ACCESS;  Surgeon: Cherre , MD;  Location: [facility] OR;  Service: Vascular;;    Allergies  Allergen Reactions   Shrimp (Diagnostic) Other (See Comments)    Unknown reaction   Other Swelling and Rash    Anesthesia - facial swelling, rash    Prior to Admission medications   Medication Sig Start Date End Date Taking? Authorizing Provider  acetaminophen (TYLENOL) 325 MG tablet Take 2 tablets (650 mg total) by mouth 4 (four) times daily. Patient taking differently: Take 650 mg by mouth every 12 (twelve) hours as needed for moderate pain. 06/30/22  Yes Elgergawy, Silver Huguenin, MD  albuterol (VENTOLIN HFA) 108 (90 Base) MCG/ACT inhaler Inhale 2 puffs into the lungs every 4 (four) hours as needed for wheezing or shortness of breath. 10/06/22  Yes Ripley Fraise, MD  apixaban (ELIQUIS) 2.5 MG TABS tablet Take 1 tablet (2.5 mg total) by mouth 2 (two) times daily. 10/06/22 12/01/23 Yes Ripley Fraise, MD  fluticasone-salmeterol (ADVAIR DISKUS) 250-50 MCG/ACT AEPB Inhale 1 puff into the lungs in the morning and at bedtime. 10/06/22  Yes Ripley Fraise, MD  gabapentin (NEURONTIN) 100 MG capsule Take 1 capsule (100 mg total) by mouth at bedtime. Patient taking differently: Take 100 mg by mouth See admin instructions. Twice a day on regular days, Tuesday, Thursday, Saturday and Sunday. Three times a day on dialysis days, Monday, Wednesday, and Friday 10/15/22  Yes Vu, Trung T, MD  hydrALAZINE (APRESOLINE) 100 MG tablet Take 1 tablet by mouth 3 times a day every Monday Wednesday Friday and Sunday Take 1 tablet by mouth 2 times a day every Tuesday Thursday and Saturday Patient taking differently: Take 50 mg by mouth See admin instructions. 50 mg three times daily on dialysis days .Monday, Wednesday, Fridays 50 mg twice daily on regular days Sunday, Tuesday, Thursday, Saturday 10/06/22  Yes Ripley Fraise, MD  Methoxy PEG-Epoetin Beta (MIRCERA IJ) Mircera 10/09/22 10/08/23 Yes [provider]  metoprolol succinate (TOPROL XL)  25 MG 24 hr tablet Take 2 tablets (50 mg total) by mouth daily. Patient taking differently: Take 50 mg by mouth 2 (two) times daily. 12/03/22  Yes Linward Natal, MD  pantoprazole (PROTONIX) 40 MG tablet Take 1 tablet (40 mg total) by mouth daily. Patient taking differently: Take 40 mg by mouth 2 (two) times daily. 10/06/22  Yes Ripley Fraise, MD  sevelamer (RENAGEL) 800 MG tablet Take 2 tablets by mouth 3 times a day every Tuesday Thursday and Saturday Give 2 tablets by mouth with meals every Monday Wednesday Friday and Sunday Patient taking differently: Take 800 mg by mouth 3 (three) times daily with meals. 10/06/22  Yes Ripley Fraise, MD  sevelamer carbonate (RENVELA) 0.8 g PACK packet Take 0.8 g by mouth with breakfast, with lunch, and with evening meal.   Yes [provider]  cloNIDine (CATAPRES) 0.1 MG tablet Take 0.1 mg by mouth every 6 (six) hours as needed (SBP > 170). Patient not taking: Reported on 01/16/2023    [provider]  Social History   Socioeconomic History   Marital status: Widowed    Spouse name: Not on file   Number of children: Not on file   Years of education: Not on file   Highest education level: Not on file  Occupational History   Not on file  Tobacco Use   Smoking status: Never    Passive exposure: Never   Smokeless tobacco: Never  Vaping Use   Vaping Use: Never used  Substance and Sexual Activity   Alcohol use: No   Drug use: No   Sexual activity: Not Currently    Birth control/protection: Post-menopausal  Other Topics Concern   Not on file  Social History Narrative   Not on file   Social Determinants of Health   Financial Resource Strain: Not on file  Food Insecurity: No Food Insecurity (11/30/2022)   Hunger Vital Sign    Worried About Running Out of Food in the Last Year: Never true    Ran Out of Food in the Last Year: Never true  Transportation Needs: No Transportation Needs (11/30/2022)   PRAPARE - Armed forces logistics/support/administrative officer (Medical): No    Lack of Transportation (Non-Medical): No  Physical Activity: Not on file  Stress: Not on file  Social Connections: Moderately Integrated (07/20/2022)   Social Connection and Isolation Panel [NHANES]    Frequency of Communication with Friends and Family: Three times a week    Frequency of Social Gatherings with Friends and Family: Three times a week    Attends Religious Services: 1 to 4 times per year    Active Member of Clubs or Organizations: Yes    Attends Archivist Meetings: 1 to 4 times per year    Marital Status: Widowed  Intimate Partner Violence: Not At Risk (11/30/2022)   Humiliation, Afraid, Rape, and Kick questionnaire    Fear of Current or Ex-Partner: No    Emotionally Abused: No    Physically Abused: No    Sexually Abused: No    Family History  Problem Relation Age of Onset   Hypertension Mother    Hypertension Father    Colon cancer Brother    Lung cancer Brother     ROS: [x]$  Positive   [ ]$  Negative   [ ]$  All sytems reviewed and are negative Cardiac: [x]$  PAF on [facility]  Vascular: []$  pain in legs while walking []$  pain in legs at rest []$  pain in legs at night []$  non-healing ulcers []$  hx of DVT []$  swelling in legs  Pulmonary: [x]$  asthma []$  home O2  Neurologic: []$  hx of CVA []$  mini stroke   Hematologic: []$  hx of cancer  Endocrine:   []$  diabetes []$  thyroid disease  GI []$  GERD  GU: [x]$  CKD/renal failure [x]$  HD--[]$  M/W/F or []$  T/T/S  Psychiatric: []$  anxiety []$  depression  Musculoskeletal: [x]$  osteomyelitis  Integumentary: []$  rashes []$  ulcers  Constitutional: []$  fever  []$  chills  Physical Examination  Vitals:   01/17/23 0427 01/17/23 0737  BP: (!) 174/110 (!) 181/97  Pulse: 84 81  Resp:  16  Temp: 99.1 F (37.3 C) 98.7 F (37.1 C)  SpO2: 99% 99%   Body mass index is 18.79 kg/m.  General:  WDWN in NAD Gait: Not observed HENT: WNL, normocephalic Pulmonary: normal non-labored  breathing Cardiac: regular Skin: without rashes Vascular Exam/Pulses: Easily palpable bilateral radial and DP pulses Extremities: Good thrill in LUA AVF; there is no evidence of infection around the left arm  fistula.  Her incision has healed well.  No drainage or erythema.   Musculoskeletal: no muscle wasting or atrophy  Neurologic: follows commands Psychiatric:  The pt is sleepy this morning but wakes and answers questions.   CBC    Component Value Date/Time   WBC 8.2 01/16/2023 0112   RBC 2.82 (L) 01/16/2023 0112   HGB 9.6 (L) 01/16/2023 0112   HCT 29.0 (L) 01/16/2023 0112   PLT 214 01/16/2023 0112   MCV 102.8 (H) 01/16/2023 0112   MCH 34.0 01/16/2023 0112   MCHC 33.1 01/16/2023 0112   RDW 19.8 (H) 01/16/2023 0112   LYMPHSABS 0.7 01/15/2023 1218   MONOABS 0.6 01/15/2023 1218   EOSABS 0.0 01/15/2023 1218   BASOSABS 0.0 01/15/2023 1218    BMET    Component Value Date/Time   NA 135 01/16/2023 0112   K 4.4 01/16/2023 0112   CL 92 (L) 01/16/2023 0112   CO2 23 01/16/2023 0112   GLUCOSE 63 (L) 01/16/2023 0112   BUN 54 (H) 01/16/2023 0112   CREATININE 9.31 (H) 01/16/2023 0112   CREATININE 5.28 (H) 09/15/2022 0449   CALCIUM 9.2 01/16/2023 0112   GFRNONAA 4 (L) 01/16/2023 0112   GFRAA 14 (L) 11/12/2019 0344    COAGS: Lab Results  Component Value Date   INR 1.0 10/05/2022   INR 1.1 09/16/2022   INR 1.2 06/12/2022      ASSESSMENT/PLAN: This is a 72 y.o. female admitted to the hospital with worsening lower back pain over the last week.  She has not had any lower extremity weakness, bowel or bladder incontinence or fever.    Pt has hx of ESRD with dialysis on T/T/S.  She has hx of left 1st stage BVT on 12/23/2021 and 2nd stage left BVT 03/08/2022 both by Dr. Stanford Breed.   VVS is asked to evaluate pt to determine if fistula could be source of infection   -pt's left arm fistula with good thrill and she has easily palpable bilateral radial and DP pulses.  There is no erythema  present around the fistula.  Her incisions healed well.  There is no drainage.  Do not feel the fistula is a source of infection. -Dr. Virl Cagey to evaluate pt and determine further plan   Leontine Locket, PA-C [facility] [phone]  VASCULAR STAFF ADDENDUM: I have independently interviewed and examined the patient. I agree with the above.  No concern for brachiobasilic fistula infection. This is autologous tissue. No errythema, induration.   Cassandria Santee, MD [facility] Phone Number: [phone] 01/17/2023 5:11 PM

## 2023-01-17 NOTE — Significant Event (Signed)
Rapid Response Event Note   Reason for Call :  RAF 140s  Initial Focused Assessment:  Patient is lying quietly in the bed.  She will open her eyes and briefly answer questions.  She is in no distress and denies pain or shortness of breath. Lung sounds decreased bases Heart tones irregular  BP 129/84  AF 115-140  RR 22 O2 sat 97% on RA  Temp 99.4  Dr Olevia Bowens notified of patient status   Interventions:  81m Lopressor IV  HR continues to be 115-140s   MD paged   Plan of Care:  RN to call if patient remains tachycardic   Event Summary:   MD Notified:  OOlevia BowensCall Time:  1836 Arrival Time: 1[place]unsee[place] Time: 1900  LRaliegh Ip RN

## 2023-01-17 NOTE — Progress Notes (Addendum)
Chart reviewed. Appears pt for snf placement. Contacted [facility] to confirm pt's schedule and times. Awaiting response from clinic. Will assist as needed.   Melven Sartorius Renal Navigator  [phone]  Addendum at 3:06 pm: Pt receives out-pt HD at [facility] on MWF with 12:00 chair time.

## 2023-01-17 NOTE — Progress Notes (Signed)
[facility] PROGRESS NOTE    Progress Note  Diamond [name]  [id number] DOB: 1951-05-07 DOA: 01/15/2023 PCP: [facility] [place]     Brief Narrative:   Diamond [name] is an 72 y.o. 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  MRI of the lumbar spine showed persistent discitis L2-L3 and L4-5 with inflammatory changes compared to previous MRI superimposed,slightly worsened compared to previous MRI.  CRP is 33 ESR is 133  Assessment/Plan:   Persistent Acute on chronic osteomyelitis (HCC)/history of bacteremia: Infectious disease was curb sided blood cultures were ordered. Continue IV vancomycin. Infectious disease was consulted recommended to continue IV Vanco 2D echo and TEE. Also recommended to repeat an MRI in 1 to 2 weeks to see if the phlegmon has developed into an abscess. Will need a weeks of IV vancomycin followed by long life suppression and oral therapy. Continue to monitor ESR and CRP. ID also recommended to consult vascular to make sure the fistula is not infected does not appear erythematous is not warm or tender to touch.  Acute metabolic encephalopathy Due to infectious etiology will continue to monitor clinically. Vitals are stable except for her blood pressure was slightly elevated. Remained afebrile with no leukocytosis. Seems to have improved.  End-stage renal disease on hemodialysis: Renal has been notified.  Paroxysmal atrial fibrillation Continue Eliquis rate control.  Essential hypertension: Continue hydralazine, restart clonidine.  Metabolic acidosis Probably secondary to worsening renal function and missed dialysis renal has been notified. She was given a liter of  normal saline in the ED bicarb is improved.   DVT prophylaxis: lovenox Family Communication:non Status is: Inpatient Remains inpatient appropriate because: Acute on chronic osteomyelitis    Code Status:     Code Status Orders  (From admission, onward)           Start     Ordered   01/16/23 0041  Full code  Continuous       Question:  By:  Answer:  Consent: discussion documented in EHR   01/16/23 0041           Code Status History     Date Active Date Inactive Code Status Order ID Comments User Context   11/30/2022 0416 12/08/2022 1911 Full Code [id number]  Idamae Schuller, MD ED   09/17/2022 0159 10/02/2022 0618 Full Code [id number]  Etta Quill, DO ED   07/26/2022 1656 07/30/2022 0040 Full Code [id number]  Karmen Bongo, MD ED   06/12/2022 2354 06/30/2022 2214 Full Code [id number]  Kayleen Memos, DO ED   02/05/2022 1857 02/06/2022 2316 Full Code [id number]  Orma Flaming, MD ED   01/16/2022 2258 01/22/2022 2041 Full Code [id number]  Shalhoub, Sherryll Burger, MD ED   12/17/2021 0259 12/30/2021 2132 Full Code [id number]  Howerter, Ethelda Chick, DO ED   11/05/2019 2206 11/13/2019 0127 Full Code [id number]  Toy Baker, MD Inpatient   10/17/2019 0444 10/21/2019 1629 Full Code [id number]  Reubin Milan, MD ED   10/16/2019 2110 10/17/2019 0444 Full Code [id number]  Reubin Milan, MD ED         IV Access:   Peripheral IV   Procedures and diagnostic studies:   MR LUMBAR SPINE WO CONTRAST  Result Date: 01/15/2023 CLINICAL DATA:  Initial evaluation for worsened lower back pain. EXAM: MRI  LUMBAR SPINE WITHOUT CONTRAST TECHNIQUE: Multiplanar, multisequence MR imaging of the lumbar spine was performed. No intravenous contrast was administered. COMPARISON:  Previous MRI from 11/30/2022. FINDINGS: Segmentation: Transitional lumbosacral anatomy with sacralization of L5. Same numbering system employed as on previous exam. Alignment: Stable trace anterolisthesis of L1 on L2 and L3 on  L4. Underlying mild scoliosis. Vertebrae: Persistent endplate irregularity with decreased T1 signal intensity, and marrow edema about the L2-3 and L4-5 interspaces. Similar intra discal fluid signal intensity at L2-3 and L3-4. Underlying chronic L2 and L3 compression deformities, stable. No acute or interval fracture. Bone marrow signal intensity heterogeneous without worrisome osseous lesion. Conus medullaris and cauda equina: Conus extends to the L1 level. Conus and cauda equina appear normal. Soft tissue material within the ventral epidural space at L3 suspicious for phlegmon, similar to prior. Paraspinal and other soft tissues: Paravertebral edema and inflammatory changes seen involving the left greater than right psoas musculature. Overall changes are slightly worsened from prior. Superimposed heterogeneous T2 hypointense area measuring 2.3 cm within the left psoas muscle is increased from prior, likely phlegmon. No drainable soft tissue fluid collections. Renal atrophy with multiple bilateral simple cysts, consistent with history of end-stage renal disease. No follow-up imaging recommended. Atherosclerotic change noted within the aorta. Disc levels: L1-2: Diffuse disc bulge with endplate spurring. Mild facet hypertrophy. Unchanged mild narrowing of the lateral recesses. Central canal remains patent. Mild left L1 foraminal narrowing. Right neural foramina remains patent. L2-3: Mild disc bulge. Mild to moderate facet hypertrophy with ligament flavum thickening. Resultant mild-to-moderate canal with bilateral subarticular stenosis. Mild bilateral L2 foraminal narrowing. Appearance is relatively stable. L3-4: Diffuse disc bulge. Moderate facet and ligament flavum hypertrophy. Superimposed epidural phlegmon within the ventral epidural space at L3. Resultant severe spinal stenosis. Mild to moderate left with mild right L3 foraminal narrowing. Appearance is stable. L4-5: Severe intervertebral disc space narrowing  with diffuse disc osteophyte complex. Superimposed small central disc protrusion with caudad angulation. Right worse than left facet hypertrophy. Moderate spinal stenosis with moderate to severe bilateral lateral recess narrowing, stable. Mild-to-moderate left L4 foraminal narrowing. Right neural foramen remains patent. L5-S1: Transitional segment. No disc bulge or focal disc herniation. No stenosis. IMPRESSION: 1. Persistent findings concerning for osteomyelitis discitis at L2-3 and L4-5 with slight interval worsening in paravertebral inflammatory changes as compared to previous MRI from 11/30/2022. Superimposed 2.3 cm heterogeneous area of involving the left psoas muscle at the level of L3 likely reflects phlegmon. No drainable fluid collections identified. 2. Underlying moderate multilevel spondylosis and facet arthrosis with moderate to severe canal and lateral recess narrowing at L2-3 through L5-S1 as above. Appearance is not significantly changed as compared to prior. Electronically Signed   By: Jeannine Boga M.D.   On: 01/15/2023 21:59   DG Chest Port 1 View  Result Date: 01/15/2023 CLINICAL DATA:  Weakness and pain. EXAM: PORTABLE CHEST 1 VIEW COMPARISON:  11/30/2022 FINDINGS: Telemetry leads overlie the chest. UPPER limits normal heart size again noted. The mediastinal silhouette is unchanged. There is no evidence of focal airspace disease, pulmonary edema, suspicious pulmonary nodule/mass, pleural effusion, or pneumothorax. No acute bony abnormalities are identified. IMPRESSION: No active disease. Electronically Signed   By: Margarette Canada M.D.   On: 01/15/2023 13:17     Medical Consultants:   None.   Subjective:    Diamond [name] no complaints this morning  Objective:    Vitals:   01/16/23 1538 01/16/23 1953 01/17/23 0427 01/17/23 0737  BP: (!) 170/99 Marland Kitchen)  168/100 (!) 174/110 (!) 181/97  Pulse: 77 74 84 81  Resp: 16   16  Temp: 99.6 F (37.6 C) 98.6 F (37 C) 99.1 F (37.3  C) 98.7 F (37.1 C)  TempSrc: Oral  Oral Oral  SpO2: 99% 98% 99% 99%  Weight:      Height:       SpO2: 99 %  No intake or output data in the 24 hours ending 01/17/23 0852  Filed Weights   01/16/23 0145  Weight: 52.8 kg    Exam: General exam: In no acute distress. Respiratory system: Good air movement and clear to auscultation. Cardiovascular system: S1 & S2 heard, RRR. No JVD. Gastrointestinal system: Abdomen is nondistended, soft and nontender.  Skin: Fistula is not erythematous warm or tender to touch Psychiatry: Judgement and insight appear normal. Mood & affect appropriate.  Data Reviewed:    Labs: Basic Metabolic Panel: Recent Labs  Lab 01/15/23 1218 01/16/23 0112  NA 136 135  K 5.0 4.4  CL 92* 92*  CO2 27 23  GLUCOSE 82 63*  BUN 46* 54*  CREATININE 8.76* 9.31*  CALCIUM 9.4 9.2    GFR Estimated Creatinine Clearance: 4.5 mL/min (A) (by C-G formula based on SCr of 9.31 mg/dL (H)). Liver Function Tests: Recent Labs  Lab 01/15/23 1218  AST 31  ALT 6  ALKPHOS 52  BILITOT 1.0  PROT 7.0  ALBUMIN 2.6*    No results for input(s): "LIPASE", "AMYLASE" in the last 168 hours. No results for input(s): "AMMONIA" in the last 168 hours. Coagulation profile No results for input(s): "INR", "PROTIME" in the last 168 hours. COVID-19 Labs  Recent Labs    01/15/23 1218  CRP 33.8*     Lab Results  Component Value Date   SARSCOV2NAA NEGATIVE 11/29/2022   SARSCOV2NAA NEGATIVE 06/12/2022   SARSCOV2NAA NEGATIVE 01/16/2022   [place] NEGATIVE 12/28/2021    CBC: Recent Labs  Lab 01/15/23 1218 01/16/23 0112  WBC 8.2 8.2  NEUTROABS 6.8  --   HGB 9.3* 9.6*  HCT 28.7* 29.0*  MCV 106.3* 102.8*  PLT 246 214    Cardiac Enzymes: No results for input(s): "CKTOTAL", "CKMB", "CKMBINDEX", "TROPONINI" in the last 168 hours. BNP (last 3 results) No results for input(s): "PROBNP" in the last 8760 hours. CBG: No results for input(s): "GLUCAP" in the last 168  hours. D-Dimer: No results for input(s): "DDIMER" in the last 72 hours. Hgb A1c: No results for input(s): "HGBA1C" in the last 72 hours. Lipid Profile: No results for input(s): "CHOL", "HDL", "LDLCALC", "TRIG", "CHOLHDL", "LDLDIRECT" in the last 72 hours. Thyroid function studies: No results for input(s): "TSH", "T4TOTAL", "T3FREE", "THYROIDAB" in the last 72 hours.  Invalid input(s): "FREET3" Anemia work up: No results for input(s): "VITAMINB12", "FOLATE", "FERRITIN", "TIBC", "IRON", "RETICCTPCT" in the last 72 hours. Sepsis Labs: Recent Labs  Lab 01/15/23 1218 01/15/23 1219 01/16/23 0112  WBC 8.2  --  8.2  LATICACIDVEN  --  1.9  --     Microbiology Recent Results (from the past 240 hour(s))  Blood culture (routine x 2)     Status: Abnormal (Preliminary result)   Collection Time: 01/15/23  2:25 PM   Specimen: BLOOD  Result Value Ref Range Status   Specimen Description BLOOD SITE NOT SPECIFIED  Final   Special Requests   Final    BOTTLES DRAWN AEROBIC AND ANAEROBIC Blood Culture adequate volume   Culture  Setup Time   Final    ANAEROBIC BOTTLE ONLY GRAM POSITIVE  COCCI IN CLUSTERS Organism ID to follow CRITICAL RESULT CALLED TO, READ BACK BY AND VERIFIED WITH:  C/ PHARMD C. PIERCE 01/16/23 1620 A. LAFRANCE    Culture (A)  Final    STAPHYLOCOCCUS AUREUS SUSCEPTIBILITIES TO FOLLOW Performed at [facility], [place] [address], [place], [place] [address]    Report Status PENDING  Incomplete  Blood Culture ID Panel (Reflexed)     Status: Abnormal   Collection Time: 01/15/23  2:25 PM  Result Value Ref Range Status   Enterococcus faecalis NOT DETECTED NOT DETECTED Final   Enterococcus Faecium NOT DETECTED NOT DETECTED Final   Listeria monocytogenes NOT DETECTED NOT DETECTED Final   Staphylococcus species DETECTED (A) NOT DETECTED Final    Comment: CRITICAL RESULT CALLED TO, READ BACK BY AND VERIFIED WITH:  C/ PHARMD C. PIERCE 01/16/23 1620 A. LAFRANCE     Staphylococcus aureus (BCID) DETECTED (A) NOT DETECTED Final    Comment: Methicillin (oxacillin)-resistant Staphylococcus aureus (MRSA). MRSA is predictably resistant to beta-lactam antibiotics (except ceftaroline). Preferred therapy is vancomycin unless clinically contraindicated. Patient requires contact precautions if  hospitalized. CRITICAL RESULT CALLED TO, READ BACK BY AND VERIFIED WITH:  C/ PHARMD C. PIERCE 01/16/23 1620 A. LAFRANCE    Staphylococcus epidermidis NOT DETECTED NOT DETECTED Final   Staphylococcus lugdunensis NOT DETECTED NOT DETECTED Final   Streptococcus species NOT DETECTED NOT DETECTED Final   Streptococcus agalactiae NOT DETECTED NOT DETECTED Final   Streptococcus pneumoniae NOT DETECTED NOT DETECTED Final   Streptococcus pyogenes NOT DETECTED NOT DETECTED Final   A.calcoaceticus-baumannii NOT DETECTED NOT DETECTED Final   Bacteroides fragilis NOT DETECTED NOT DETECTED Final   Enterobacterales NOT DETECTED NOT DETECTED Final   Enterobacter cloacae complex NOT DETECTED NOT DETECTED Final   Escherichia coli NOT DETECTED NOT DETECTED Final   Klebsiella aerogenes NOT DETECTED NOT DETECTED Final   Klebsiella oxytoca NOT DETECTED NOT DETECTED Final   Klebsiella pneumoniae NOT DETECTED NOT DETECTED Final   Proteus species NOT DETECTED NOT DETECTED Final   Salmonella species NOT DETECTED NOT DETECTED Final   Serratia marcescens NOT DETECTED NOT DETECTED Final   Haemophilus influenzae NOT DETECTED NOT DETECTED Final   Neisseria meningitidis NOT DETECTED NOT DETECTED Final   Pseudomonas aeruginosa NOT DETECTED NOT DETECTED Final   Stenotrophomonas maltophilia NOT DETECTED NOT DETECTED Final   Candida albicans NOT DETECTED NOT DETECTED Final   Candida auris NOT DETECTED NOT DETECTED Final   Candida glabrata NOT DETECTED NOT DETECTED Final   Candida krusei NOT DETECTED NOT DETECTED Final   Candida parapsilosis NOT DETECTED NOT DETECTED Final   Candida tropicalis NOT  DETECTED NOT DETECTED Final   Cryptococcus neoformans/gattii NOT DETECTED NOT DETECTED Final   Meth resistant mecA/C and MREJ DETECTED (A) NOT DETECTED Final    Comment: CRITICAL RESULT CALLED TO, READ BACK BY AND VERIFIED WITH:  C/ PHARMD C. PIERCE 01/16/23 1620 A. LAFRANCE Performed at [facility], [place] [address], [place], [place] [address]      Medications:    apixaban  2.5 mg Oral BID   hydrALAZINE  50 mg Oral 3 times per day on Mon Wed Fri   And   hydrALAZINE  50 mg Oral 2 times per day on Sun Tue Thu Sat   metoprolol succinate  50 mg Oral Daily   pantoprazole  40 mg Oral Daily   sevelamer carbonate  800 mg Oral TID WC   Continuous Infusions:  vancomycin  LOS: 1 day   Charlynne Cousins  [facility]  01/17/2023, 8:52 AM

## 2023-01-17 NOTE — Progress Notes (Signed)
Received patient in bed to unit.  Alert and oriented.  Informed consent signed and in chart.   TX duration: 3hrs  HR remain elevated at 120-140'sbpm after HD tx; Rapid Response nurse notified. Metoprolol 42m IV given per order. Pt was transported back to the room with HD RN and RR RN. Hand-off given to patient's nurse.   Access used: L AVF Access issues: None  Total UF removed: 4029mMedication(s) given: Vanc 1g  Post HD weight: 53kg   01/17/23 1814  Vitals  Temp 98.5 F (36.9 C)  Temp Source Oral  BP 124/75  MAP (mmHg) 87  BP Location Right Arm  BP Method Automatic  Patient Position (if appropriate) Lying  Pulse Rate 61  ECG Heart Rate (!) 115  Resp 18  Oxygen Therapy  SpO2 99 %  O2 Device Room Air  During Treatment Monitoring  Intra-Hemodialysis Comments Tolerated well;Tx completed  Post Treatment  Dialyzer Clearance Lightly streaked  Duration of HD Treatment -hour(s) 3 hour(s)  Liters Processed 72  Fluid Removed (mL) 400 mL  Tolerated HD Treatment Yes  AVG/AVF Arterial Site Held (minutes) 7 minutes  AVG/AVF Venous Site Held (minutes) 7 minutes  Fistula / Graft Left Forearm Arteriovenous fistula  Placement Date/Time: (c) 12/23/21 1646   Placed prior to admission: No  Orientation: Left  Access Location: Forearm  Access Type: Arteriovenous fistula  Site Condition No complications  Fistula / Graft Assessment Present;Thrill;Bruit  Status Deaccessed     [facility]

## 2023-01-17 NOTE — Procedures (Signed)
Patient seen on Hemodialysis. BP 122/66   Pulse (!) 42   Temp 98.1 F (36.7 C) (Oral)   Resp 20   Ht 5' 6"$  (1.676 m)   Wt 53.4 kg   SpO2 99%   BMI 19.00 kg/m   QB 400, UF goal 2L Tolerating treatment without complaints at this time.   Elmarie Shiley MD [facility]. Office # [phone] Pager # [phone] 3:46 PM

## 2023-01-17 NOTE — Progress Notes (Signed)
Patient ID: Diamond [name], female   DOB: 08/20/1951, 72 y.o.   MRN: [id number] Night shift hospitalist paged. Awaiting call back.  Haydee Salter, RN

## 2023-01-17 NOTE — TOC Initial Note (Signed)
Transition of Care [facility]) - Initial/Assessment Note    Patient Details  Name: Diamond [name] MRN: [id number] Date of Birth: January 16, 1951  Transition of Care [facility]) CM/SW Contact:    Bethann Berkshire, [place] Phone Number: 01/17/2023, 1:07 PM  Clinical Narrative:                  Pt is not oriented to situation. CSW called pt's son to discuss SNF rec. No answer; CSW left voicemail requesting return call.   1320: Son called CSW back. CSW explains SNF rec and pt son agrees to SNF w/u. He's agreeable to SNF referrals being sent to other SNF's in network with [facility] but prefers [facility]. Pt was recently there; son states she made good progress and went home from [facility] some time in January. Pt lives with one of her daughters and has other family members (nieces and nephews) who rotate in assisting pt. Pt has the same [facility] and days(MWF) as she did when she was previously at [employer]. Son reiterates it would be ideal if pt could return to [facility]. Fl2 completed and bed requests sent in hub.   Expected Discharge Plan: [place] Barriers to Discharge: Continued Medical Work up, SNF Pending bed offer   Patient Goals and CMS Choice            Expected Discharge Plan and Services       Living arrangements for the past 2 months: Single Family Home                                      Prior Living Arrangements/Services Living arrangements for the past 2 months: Single Family Home   Patient language and need for interpreter reviewed:: Yes        Need for Family Participation in Patient Care: Yes (Comment) Care giver support system in place?: Yes (comment)   Criminal Activity/Legal Involvement Pertinent to Current Situation/Hospitalization: No - Comment as needed  Activities of Daily Living      Permission Sought/Granted                  Emotional Assessment       Orientation: : Oriented to Self, Oriented to Place Alcohol / Substance Use:  Not Applicable Psych Involvement: No (comment)  Admission diagnosis:  Vertebral osteomyelitis ([place]) [M46.20] Acute on chronic osteomyelitis ([place]) [M86.10, M86.60] Patient Active Problem List   Diagnosis Date Noted   Acute on chronic osteomyelitis ([place]) 99991111   Metabolic acidosis 99991111   Chronic osteomyelitis of lumbar spine ([place]) 12/08/2022   Acute cough 12/07/2022   Fever in adult 12/02/2022   Altered mental status 11/30/2022   AMS (altered mental status) 11/30/2022   PAF (paroxysmal atrial fibrillation) ([place])    Thyroid nodule 09/17/2022   Empty sella ([place]) 09/17/2022   Discitis of lumbosacral region 07/26/2022   Hypertensive urgency 07/21/2022   Bacteremia 06/30/2022   Malnutrition of moderate degree 06/22/2022   Physical deconditioning 06/18/2022   Pressure injury of skin 06/16/2022   MRSA bacteremia 06/13/2022   ESRD on dialysis [facility])    Paroxysmal atrial flutter (HCC)    Acute metabolic encephalopathy    Acute on chronic anemia with positive fecal occult  02/05/2022   Multiple polyps of sigmoid colon    Heme positive stool    Duodenitis    Candida esophagitis (HCC)    Gallbladder mass  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  01/16/2022   Discitis of lumbar region 01/16/2022   Hepatitis C without hepatic coma 01/15/2022   Sepsis due to methicillin resistant Staphylococcus aureus ([place]) 12/31/2021   Atrial flutter ([place]) 12/25/2021   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 seen on TEE on 1/17 12/24/2021   Lung nodule seen on imaging study 12/23/2021   Vertebral osteomyelitis ([place])    Sepsis due to methicillin susceptible Staphylococcus aureus ([place]) 12/17/2021   Lower back pain 12/17/2021   ESRD on hemodialysis ([place]) 12/17/2021   Encounter for screening for other viral diseases 11/03/2021   Allergy, unspecified, initial encounter 09/14/2021   Coagulation defect, unspecified ([place]) 99991111   Complication of vascular  dialysis catheter 09/14/2021   Gout due to renal impairment, right ankle and foot 09/14/2021   Iron deficiency anemia, unspecified 09/14/2021   Pain, unspecified 09/14/2021   Pruritus, unspecified 09/14/2021   Secondary hyperparathyroidism of renal origin ([place]) 09/14/2021   Shortness of breath 09/14/2021   Unspecified protein-calorie malnutrition ([place]) 09/14/2021   Vitamin D deficiency 01/02/2021   Hyperparathyroidism ([place]) 12/31/2020   CKD (chronic kidney disease), stage III ([place]) 11/10/2019   Cardiomegaly 11/05/2019   Hyperkalemia 11/05/2019   Hypokalemia 10/18/2019   Pneumonia due to COVID-19 virus 10/17/2019   Hypertension    Asthma    Gout    Hyponatremia    Multifactorial anemia-acute blood loss from bleeding HD catheter site-superimposed on anemia related to ESRD.    PCP:  [facility], [place]:   [place] ([address] [place] [phone] [place] ([place]) [place] [address] Phone: [phone] Fax: [place], [place] - [address] [place] [address] Phone: [phone] Fax: [phone]     Social Determinants of Health (SDOH) Social History: [place]: No Food Insecurity (11/30/2022)  Housing: Low Risk  (11/30/2022)  Transportation Needs: No Transportation Needs (11/30/2022)  Utilities: Not At Risk (11/30/2022)  Depression (PHQ2-9): Low Risk  (01/05/2023)  Social Connections: Moderately Integrated (07/20/2022)  Tobacco Use: Low Risk  (11/29/2022)   SDOH Interventions:     Readmission Risk Interventions    06/30/2022    1:12 PM 06/17/2022    1:02 PM 01/22/2022   12:19 PM  Readmission Risk Prevention Plan  Transportation Screening Complete Complete Complete  Medication Review Press photographer) Complete Complete Complete  PCP or Specialist appointment within 3-5 days of discharge Complete Complete  Complete  HRI or Home Care Consult Complete Complete Complete  SW Recovery Care/Counseling Consult Complete Complete Complete  Palliative Care Screening Not Applicable Not Applicable Not [place] Not Applicable Complete Not Applicable

## 2023-01-17 NOTE — Consult Note (Signed)
Reason for Consult: Continuity of ESRD care Referring Physician: Charlynne Cousins, MD [facility])  HPI:  72 year old woman with past medical history significant for hypertension, atrial fibrillation on Eliquis, bronchial asthma, history of MRSA infection with discitis, osteomyelitis and endocarditis status post prolonged antimicrobial therapy, anemia of chronic disease and end-stage renal disease on hemodialysis.  She presented to the emergency room 2 days ago with complaints of increasing lower back pain over the past week and was found to have evidence of L2-L3 and L4-L5 osteomyelitis and discitis with slight interval worsening in paravertebral inflammatory changes compared to her MRI from December 2023 along with a psoas phlegmon.  Blood cultures are again positive for MRSA.  She has been seen by the ID service and plans noted for intravenous vancomycin for 8 weeks followed by lifelong antimicrobial therapy for MRSA.   Dialysis prescription: [facility], Monday/Wednesday/Friday, 180 dialyzer, 4 hours, BFR 400/DFR 600, EDW 52.5 kg, 2K/2.0 calcium, left BCF 15 G needles, Mircera 200 mcg every 2 weeks, sevelamer 1600 mg 3 times daily AC.  No heparin.  She missed her dialysis treatment on 2/9 and has been cutting short her treatments (3 hours 28 minutes on 2/7, 2 hours 30 minutes on 2/5 and 3 hours on 2/3).  Past Medical History:  Diagnosis Date   Anemia    Asthma    Atrial flutter with rapid ventricular response ([place]) 12/25/2021   Chronic kidney disease    dialysis Tues Thurs Sat   Gout    Hypertension     Past Surgical History:  Procedure Laterality Date   AV FISTULA PLACEMENT Left 12/23/2021   Procedure: LEFT ARM BRACHIOBASILIC VEIN ARTERIOVENOUS (AV) FISTULA CREATION;  Surgeon: [name], MD;  Location: [place];  Service: Vascular;  Laterality: Left;   [place] Left 03/08/2022   Procedure: LEFT SECOND STAGE BASILIC VEIN TRANSPOSITION;  Surgeon:  [name], MD;  Location: [facility] OR;  Service: Vascular;  Laterality: Left;   BIOPSY  01/19/2022   Procedure: BIOPSY;  Surgeon: Lavena Bullion, DO;  Location: [place] ENDOSCOPY;  Service: Gastroenterology;;   [place] N/A 06/25/2022   Procedure: OPEN CHOLECYSTECTOMY;  Surgeon: Dwan Bolt, MD;  Location: [place];  Service: General;  Laterality: N/A;   COLONOSCOPY Left 01/19/2022   Procedure: COLONOSCOPY;  Surgeon: Lavena Bullion, DO;  Location: [place];  Service: Gastroenterology;  Laterality: Left;   ESOPHAGOGASTRODUODENOSCOPY Left 01/19/2022   Procedure: ESOPHAGOGASTRODUODENOSCOPY (EGD);  Surgeon: Lavena Bullion, DO;  Location: [facility] ENDOSCOPY;  Service: Gastroenterology;  Laterality: Left;   INSERTION OF DIALYSIS CATHETER Right 12/23/2021   Procedure: INSERTION OF DIALYSIS CATHETER;  Surgeon: [name], MD;  Location: [place];  Service: Vascular;  Laterality: Right;   LAPAROSCOPY N/A 06/25/2022   Procedure: STAGING LAPAROSCOPY;  Surgeon: Dwan Bolt, MD;  Location: [place];  Service: General;  Laterality: N/A;   OPEN PARTIAL HEPATECTOMY  N/A 06/25/2022   Procedure: OPEN PARTIAL HEPATECTOMY;  Surgeon: Dwan Bolt, MD;  Location: [place];  Service: General;  Laterality: N/A;   POLYPECTOMY  01/19/2022   Procedure: POLYPECTOMY;  Surgeon: Lavena Bullion, DO;  Location: [place] ENDOSCOPY;  Service: Gastroenterology;;   TEE WITHOUT CARDIOVERSION N/A 12/22/2021   Procedure: TRANSESOPHAGEAL ECHOCARDIOGRAM (TEE);  Surgeon: Jerline Pain, MD;  Location: [facility] ENDOSCOPY;  Service: Cardiovascular;  Laterality: N/A;   TEE WITHOUT CARDIOVERSION N/A 06/17/2022   Procedure: TRANSESOPHAGEAL ECHOCARDIOGRAM (TEE);  Surgeon: Buford Dresser, MD;  Location: [facility] ENDOSCOPY;  Service: Cardiovascular;  Laterality: N/A;   ULTRASOUND GUIDANCE FOR VASCULAR ACCESS  12/23/2021   Procedure: ULTRASOUND GUIDANCE FOR VASCULAR ACCESS;  Surgeon: [name], MD;  Location: [facility]  OR;  Service: Vascular;;    Family History  Problem Relation Age of Onset   Hypertension Mother    Hypertension Father    Colon cancer Brother    Lung cancer Brother     Social History:  reports that she has never smoked. She has never been exposed to tobacco smoke. She has never used smokeless tobacco. She reports that she does not drink alcohol and does not use drugs.  Allergies:  Allergies  Allergen Reactions   Shrimp (Diagnostic) Other (See Comments)    Unknown reaction   Other Swelling and Rash    Anesthesia - facial swelling, rash    Medications: I have reviewed the patient's current medications. Scheduled:  apixaban  2.5 mg Oral BID   Chlorhexidine Gluconate Cloth  6 each Topical Q0600   hydrALAZINE  50 mg Oral 3 times per day on Mon Wed Fri   And   hydrALAZINE  50 mg Oral 2 times per day on Sun Tue Thu Sat   metoprolol succinate  50 mg Oral Daily   pantoprazole  40 mg Oral Daily   sevelamer carbonate  800 mg Oral TID WC   Continuous:  vancomycin        Latest Ref Rng & Units 01/16/2023    1:12 AM 01/15/2023   12:18 PM 12/07/2022    3:44 AM  BMP  Glucose 70 - 99 mg/dL 63  82  98   BUN 8 - 23 mg/dL 54  46  36   Creatinine 0.44 - 1.00 mg/dL 9.31  8.76  6.40   Sodium 135 - 145 mmol/L 135  136  135   Potassium 3.5 - 5.1 mmol/L 4.4  5.0  5.0   Chloride 98 - 111 mmol/L 92  92  94   CO2 22 - 32 mmol/L 23  27  27   $ Calcium 8.9 - 10.3 mg/dL 9.2  9.4  9.6       Latest Ref Rng & Units 01/16/2023    1:12 AM 01/15/2023   12:18 PM 12/07/2022    3:44 AM  CBC  WBC 4.0 - 10.5 K/uL 8.2  8.2  2.5   Hemoglobin 12.0 - 15.0 g/dL 9.6  9.3  8.9   Hematocrit 36.0 - 46.0 % 29.0  28.7  27.4   Platelets 150 - 400 K/uL 214  246  167    MR LUMBAR SPINE WO CONTRAST  Result Date: 01/15/2023 CLINICAL DATA:  Initial evaluation for worsened lower back pain. EXAM: MRI LUMBAR SPINE WITHOUT CONTRAST TECHNIQUE: Multiplanar, multisequence MR imaging of the lumbar spine was performed. No  intravenous contrast was administered. COMPARISON:  Previous MRI from 11/30/2022. FINDINGS: Segmentation: Transitional lumbosacral anatomy with sacralization of L5. Same numbering system employed as on previous exam. Alignment: Stable trace anterolisthesis of L1 on L2 and L3 on L4. Underlying mild scoliosis. Vertebrae: Persistent endplate irregularity with decreased T1 signal intensity, and marrow edema about the L2-3 and L4-5 interspaces. Similar intra discal fluid signal intensity at L2-3 and L3-4. Underlying chronic L2 and L3 compression deformities, stable. No acute or interval fracture. Bone marrow signal intensity heterogeneous without worrisome osseous lesion. Conus medullaris and cauda equina: Conus extends to the L1 level. Conus and cauda equina appear normal. Soft tissue material within the ventral epidural  space at L3 suspicious for phlegmon, similar to prior. Paraspinal and other soft tissues: Paravertebral edema and inflammatory changes seen involving the left greater than right psoas musculature. Overall changes are slightly worsened from prior. Superimposed heterogeneous T2 hypointense area measuring 2.3 cm within the left psoas muscle is increased from prior, likely phlegmon. No drainable soft tissue fluid collections. Renal atrophy with multiple bilateral simple cysts, consistent with history of end-stage renal disease. No follow-up imaging recommended. Atherosclerotic change noted within the aorta. Disc levels: L1-2: Diffuse disc bulge with endplate spurring. Mild facet hypertrophy. Unchanged mild narrowing of the lateral recesses. Central canal remains patent. Mild left L1 foraminal narrowing. Right neural foramina remains patent. L2-3: Mild disc bulge. Mild to moderate facet hypertrophy with ligament flavum thickening. Resultant mild-to-moderate canal with bilateral subarticular stenosis. Mild bilateral L2 foraminal narrowing. Appearance is relatively stable. L3-4: Diffuse disc bulge. Moderate  facet and ligament flavum hypertrophy. Superimposed epidural phlegmon within the ventral epidural space at L3. Resultant severe spinal stenosis. Mild to moderate left with mild right L3 foraminal narrowing. Appearance is stable. L4-5: Severe intervertebral disc space narrowing with diffuse disc osteophyte complex. Superimposed small central disc protrusion with caudad angulation. Right worse than left facet hypertrophy. Moderate spinal stenosis with moderate to severe bilateral lateral recess narrowing, stable. Mild-to-moderate left L4 foraminal narrowing. Right neural foramen remains patent. L5-S1: Transitional segment. No disc bulge or focal disc herniation. No stenosis. IMPRESSION: 1. Persistent findings concerning for osteomyelitis discitis at L2-3 and L4-5 with slight interval worsening in paravertebral inflammatory changes as compared to previous MRI from 11/30/2022. Superimposed 2.3 cm heterogeneous area of involving the left psoas muscle at the level of L3 likely reflects phlegmon. No drainable fluid collections identified. 2. Underlying moderate multilevel spondylosis and facet arthrosis with moderate to severe canal and lateral recess narrowing at L2-3 through L5-S1 as above. Appearance is not significantly changed as compared to prior. Electronically Signed   By: Jeannine Boga M.D.   On: 01/15/2023 21:59   DG Chest Port 1 View  Result Date: 01/15/2023 CLINICAL DATA:  Weakness and pain. EXAM: PORTABLE CHEST 1 VIEW COMPARISON:  11/30/2022 FINDINGS: Telemetry leads overlie the chest. UPPER limits normal heart size again noted. The mediastinal silhouette is unchanged. There is no evidence of focal airspace disease, pulmonary edema, suspicious pulmonary nodule/mass, pleural effusion, or pneumothorax. No acute bony abnormalities are identified. IMPRESSION: No active disease. Electronically Signed   By: Margarette Canada M.D.   On: 01/15/2023 13:17    Review of Systems  Constitutional:  Positive for  activity change, appetite change, chills and fatigue. Negative for fever.  HENT:  Negative for hearing loss, sore throat and trouble swallowing.   Eyes:  Negative for photophobia and visual disturbance.  Respiratory:  Negative for chest tightness, shortness of breath and wheezing.   Cardiovascular:  Negative for chest pain and leg swelling.  Gastrointestinal:  Negative for abdominal pain, diarrhea, nausea and vomiting.  Endocrine: Negative for polydipsia, polyphagia and polyuria.  Musculoskeletal:  Positive for back pain, gait problem and myalgias.  Skin:  Negative for rash and wound.  Neurological:  Positive for weakness.   Blood pressure (!) 181/97, pulse 81, temperature 98.7 F (37.1 C), temperature source Oral, resp. rate 16, height 5' 6"$  (1.676 m), weight 52.8 kg, SpO2 99 %. Physical Exam Constitutional:      Appearance: She is normal weight. She is ill-appearing.     Comments: Somnolent, awakens with difficulty  HENT:     Head: Normocephalic and atraumatic.  Right Ear: External ear normal.     Left Ear: External ear normal.     Nose: Nose normal. No congestion.     Mouth/Throat:     Mouth: Mucous membranes are dry.     Pharynx: Oropharynx is clear.  Eyes:     General: No scleral icterus.    Extraocular Movements: Extraocular movements intact.     Conjunctiva/sclera: Conjunctivae normal.  Cardiovascular:     Rate and Rhythm: Normal rate. Rhythm irregular.     Pulses: Normal pulses.     Heart sounds: Normal heart sounds.  Pulmonary:     Effort: Pulmonary effort is normal.     Breath sounds: Normal breath sounds. No wheezing or rales.  Abdominal:     General: Abdomen is flat. Bowel sounds are normal. There is no distension.     Palpations: Abdomen is soft.  Musculoskeletal:     Cervical back: Normal range of motion and neck supple.     Right lower leg: No edema.     Left lower leg: No edema.     Comments: Left upper arm AV fistula palpable thrill  Lymphadenopathy:      Cervical: No cervical adenopathy.  Skin:    General: Skin is warm and dry.    Assessment/Plan: 1.  Recurrent MRSA infection with persistent osteomyelitis/discitis of L2-L3 and L4-L5: Seen by infectious disease with recommendations made for intravenous antimicrobial therapy with vancomycin for the next 8 weeks followed by lifelong suppressive antibiotics.  AV fistula physically without any evidence of infection.  Additional workup with TTE/TEE recommended. 2.  End-stage renal disease: Continue hemodialysis on a Monday/Wednesday/Friday schedule via left brachiocephalic fistula.  She appears to be close to euvolemic on physical exam and despite signing off early from dialysis, labs do not appear to have any critical abnormality. 3.  Hypertension: Elevated blood pressures noted, restarted hydralazine and will monitor with ultrafiltration/hemodialysis today. 4.  Acute metabolic encephalopathy: Secondary to acute infection +/- missed hemodialysis treatments.  Monitor with antibiotic therapy and dialysis. 5.  Anemia of chronic disease: Likely compounded by the ESA resistance in the setting of infection.  No overt losses noted and received ESA last week. 6.  Secondary hyperparathyroidism: Continue renal diet with resumption of phosphorus binders for phosphorus control.  Not on Sensipar or vitamin D receptor analog.  Carlson Belland K. 01/17/2023, 10:24 AM

## 2023-01-17 NOTE — Progress Notes (Signed)
  Echocardiogram 2D Echocardiogram has been performed.  Diamond [name] 01/17/2023, 11:43 AM

## 2023-01-17 NOTE — Progress Notes (Signed)
Patient ID: MIDORI SLACUM, female   DOB: Mar 03, 1951, 72 y.o.   MRN: [id number]   01/17/23 0737  Vitals  Temp 98.7 F (37.1 C)  Temp Source Oral  BP (!) 181/97  MAP (mmHg) 122  BP Location Right Arm  BP Method Automatic  Patient Position (if appropriate) Lying  Pulse Rate 81  Pulse Rate Source Monitor  Resp 16  MEWS COLOR  MEWS Score Color Green  Oxygen Therapy  SpO2 99 %  O2 Device Room Air  MEWS Score  MEWS Temp 0  MEWS Systolic 0  MEWS Pulse 0  MEWS RR 0  MEWS LOC 0  MEWS Score 0   Patient s blood pressure 181/97. Called hemodialysis x 2 but they cannot give me a time that she will be going down today. Waited 2.5 hours, blood pressure medicine given because blood pressure is still 181/95.  Haydee Salter, RN

## 2023-01-17 NOTE — Progress Notes (Signed)
Pt BP was 174/110, pt has no PRN. On call provider notified.

## 2023-01-17 NOTE — Progress Notes (Signed)
Still awaiting reponse from MD. Heart rate continues to  be 120s-150s. Rapid response nurse has left the floor. Advised to  night shift coverage  Haydee Salter, RN .

## 2023-01-18 ENCOUNTER — Encounter (HOSPITAL_COMMUNITY): Payer: Self-pay | Admitting: Family Medicine

## 2023-01-18 DIAGNOSIS — N186 End stage renal disease: Secondary | ICD-10-CM | POA: Diagnosis not present

## 2023-01-18 DIAGNOSIS — M4620 Osteomyelitis of vertebra, site unspecified: Secondary | ICD-10-CM | POA: Diagnosis not present

## 2023-01-18 DIAGNOSIS — M4646 Discitis, unspecified, lumbar region: Secondary | ICD-10-CM | POA: Diagnosis not present

## 2023-01-18 DIAGNOSIS — M4626 Osteomyelitis of vertebra, lumbar region: Secondary | ICD-10-CM | POA: Diagnosis not present

## 2023-01-18 DIAGNOSIS — G9341 Metabolic encephalopathy: Secondary | ICD-10-CM | POA: Diagnosis not present

## 2023-01-18 DIAGNOSIS — M8610 Other acute osteomyelitis, unspecified site: Secondary | ICD-10-CM | POA: Diagnosis not present

## 2023-01-18 LAB — CULTURE, BLOOD (ROUTINE X 2): Special Requests: ADEQUATE

## 2023-01-18 LAB — HEPATITIS B SURFACE ANTIBODY, QUANTITATIVE: Hep B S AB Quant (Post): 5.1 m[IU]/mL — ABNORMAL LOW (ref >9.9)

## 2023-01-18 MED ORDER — MORPHINE SULFATE (PF) 4 MG/ML IV SOLN
4.0000 mg | INTRAVENOUS | Status: DC | PRN
  Administered 2023-01-19: 4 mg via INTRAVENOUS
  Filled 2023-01-18: qty 1

## 2023-01-18 MED ORDER — METOPROLOL SUCCINATE ER 25 MG PO TB24: 50.0000 mg | ORAL_TABLET | Freq: Every day | ORAL | Status: DC

## 2023-01-18 MED ORDER — METOPROLOL SUCCINATE ER 100 MG PO TB24
100.0000 mg | ORAL_TABLET | Freq: Every day | ORAL | Status: DC
  Administered 2023-01-18: 100 mg via ORAL
  Filled 2023-01-18: qty 1

## 2023-01-18 MED ORDER — METOPROLOL TARTRATE 5 MG/5ML IV SOLN
5.0000 mg | Freq: Four times a day (QID) | INTRAVENOUS | Status: AC | PRN
  Administered 2023-01-18: 5 mg via INTRAVENOUS
  Filled 2023-01-18: qty 5

## 2023-01-18 NOTE — Progress Notes (Signed)
Pt daughter phoned stating that pt is a feeder and will not eat unless prompted. Advised pt daughter this information will be documented.

## 2023-01-18 NOTE — Progress Notes (Signed)
Patient ID: Diamond [name], female   DOB: [birth year], 72 y.o.   MRN: [id number] Patient still in Atrial Fib this morning rate 120-130s. Notified MD. Beta blocker and pain medicines adjusted. Will continue to monitor.  Patterson Hammersmith RN BSN [facility]

## 2023-01-18 NOTE — TOC Progression Note (Signed)
Transition of Care [facility]) - Progression Note    Patient Details  Name: Diamond [name] MRN: [id number] Date of Birth: [birth year]  Transition of Care [facility]) CM/SW [place], [place] Phone Number: 01/18/2023, 2:01 PM  Clinical Narrative:     CSW spoke with pt's son who confirmed they would like for pt to go to [facility]. Facility updated and following. CSW left VM for PACE case worker. TOC will continue to follow.   Expected Discharge Plan: [place] Barriers to Discharge: Continued Medical Work up, SNF Pending bed offer  Expected Discharge Plan and Services       Living arrangements for the past 2 months: Single Family Home                                       Social Determinants of Health (SDOH) Interventions SDOH Screenings   Food Insecurity: No Food Insecurity (11/30/2022)  Housing: Low Risk  (11/30/2022)  Transportation Needs: No Transportation Needs (11/30/2022)  Utilities: Not At Risk (11/30/2022)  Depression (PHQ2-9): Low Risk  (01/05/2023)  Social Connections: Moderately Integrated (07/20/2022)  Tobacco Use: Low Risk  (01/18/2023)    Readmission Risk Interventions    06/30/2022    1:12 PM 06/17/2022    1:02 PM 01/22/2022   12:19 PM  Readmission Risk Prevention Plan  Transportation Screening Complete Complete Complete  Medication Review Press photographer) Complete Complete Complete  PCP or Specialist appointment within 3-5 days of discharge Complete Complete Complete  HRI or Home Care Consult Complete Complete Complete  SW Recovery Care/Counseling Consult Complete Complete Complete  Palliative Care Screening Not Applicable Not Applicable Not [place] Not Applicable Complete Not Applicable

## 2023-01-18 NOTE — Progress Notes (Signed)
[facility] PROGRESS NOTE    Progress Note  Diamond [name]  [id number] DOB: 11-02-1951 DOA: 01/15/2023 PCP: [facility] [place]     Brief Narrative:   Diamond [name] is an 72 y.o. 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  MRI of the lumbar spine showed persistent discitis L2-L3 and L4-5 with inflammatory changes compared to previous MRI superimposed,slightly worsened compared to previous MRI.  CRP is 33 ESR is 133  Assessment/Plan:   Persistent Acute on chronic osteomyelitis (HCC)/history of bacteremia: Continue IV vancomycin. Infectious disease was consulted recommended to continue IV Vanco 2D echo showed an EF of [id number] grade 2 diastolic heart failure, mitral valve is abnormal with trivial regurgitation no evidence of stenosis moderate annular calcification. Etiology about TEE. Also recommended to repeat an MRI in 1 to 2 weeks to see if the phlegmon has developed into an abscess. Will need a weeks of IV vancomycin followed by long life suppression and oral therapy. Continue to monitor ESR and CRP. Consult vascular the fistula is not infected does not appear erythematous is not warm or tender to touch.  Acute metabolic encephalopathy Due to infectious etiology. Vitals are stable except for her blood pressure was slightly elevated. Remained afebrile with no leukocytosis. Seems to have improved.  End-stage renal disease on hemodialysis: Renal has been notified.  A-fib with RVR: Tachycardic continue Eliquis will increase metoprolol orally started on IV metoprolol as needed  Essential hypertension: Continue hydralazine, continue clonidine as needed.  Metabolic acidosis Probably secondary  to worsening renal function and missed dialysis renal has been notified. She was given a liter of normal saline in the ED bicarb is improved.   DVT prophylaxis: lovenox Family Communication:non Status is: Inpatient Remains inpatient appropriate because: Acute on chronic osteomyelitis    Code Status:     Code Status Orders  (From admission, onward)           Start     Ordered   01/16/23 0041  Full code  Continuous       Question:  By:  Answer:  Consent: discussion documented in EHR   01/16/23 0041           Code Status History     Date Active Date Inactive Code Status Order ID Comments User Context   11/30/2022 0416 12/08/2022 1911 Full Code [id number]  Idamae Schuller, MD ED   09/17/2022 0159 10/02/2022 0618 Full Code [id number]  Etta Quill, DO ED   07/26/2022 1656 07/30/2022 0040 Full Code [id number]  Karmen Bongo, MD ED   06/12/2022 2354 06/30/2022 2214 Full Code [id number]  Kayleen Memos, DO ED   02/05/2022 1857 02/06/2022 2316 Full Code [id number]  Orma Flaming, MD ED   01/16/2022 2258 01/22/2022 2041 Full Code [id number]  Shalhoub, Sherryll Burger, MD ED   12/17/2021 0259 12/30/2021 2132 Full Code [id number]  Howerter, Ethelda Chick, DO ED   11/05/2019 2206 11/13/2019 0127 Full Code [id number]  Toy Baker, MD Inpatient   10/17/2019 0444 10/21/2019 1629 Full Code [id number]  Reubin Milan, MD ED   10/16/2019 2110 10/17/2019 0444 Full Code [id number]  Reubin Milan, MD ED         IV Access:   Peripheral IV   Procedures and diagnostic studies:   ECHOCARDIOGRAM COMPLETE  Result Date: 01/17/2023  ECHOCARDIOGRAM REPORT   Patient Name:   Diamond [name] Date of Exam: 01/17/2023 Medical Rec #:  [id number]      Height:       66.0 in Accession #:    [id number]     Weight:       116.4 lb Date of Birth:  16-Jan-1951     BSA:          1.589 m Patient Age:    38 years       BP:           181/97 mmHg Patient Gender: F              HR:           80 bpm. Exam Location:   Inpatient Procedure: 2D Echo Indications:    bacteremia  History:        Patient has prior history of Echocardiogram examinations, most                 recent 09/22/2022.  Sonographer:    Harvie Junior Referring Phys: [place]  1. Left ventricular ejection fraction, by estimation, is 60 to 65%. The left ventricle has normal function. The left ventricle has no regional wall motion abnormalities. Left ventricular diastolic parameters are consistent with Grade II diastolic dysfunction (pseudonormalization).  2. Right ventricular systolic function is normal. The right ventricular size is normal. There is mildly elevated pulmonary artery systolic pressure.  3. The mitral valve is abnormal. Trivial mitral valve regurgitation. No evidence of mitral stenosis. Moderate mitral annular calcification.  4. The aortic valve is tricuspid. There is moderate calcification of the aortic valve. There is mild thickening of the aortic valve. Aortic valve regurgitation is trivial. Aortic valve sclerosis/calcification is present, without any evidence of aortic stenosis.  5. The inferior vena cava is normal in size with greater than 50% respiratory variability, suggesting right atrial pressure of 3 mmHg. Comparison(s): No significant change from prior study. Conclusion(s)/Recommendation(s): No evidence of valvular vegetations on this transthoracic echocardiogram. Consider a transesophageal echocardiogram to exclude infective endocarditis if clinically indicated. FINDINGS  Left Ventricle: Left ventricular ejection fraction, by estimation, is 60 to 65%. The left ventricle has normal function. The left ventricle has no regional wall motion abnormalities. The left ventricular internal cavity size was normal in size. There is  borderline left ventricular hypertrophy. Left ventricular diastolic parameters are consistent with Grade II diastolic dysfunction (pseudonormalization). Right Ventricle: The right ventricular  size is normal. No increase in right ventricular wall thickness. Right ventricular systolic function is normal. There is mildly elevated pulmonary artery systolic pressure. The tricuspid regurgitant velocity is 3.09  m/s, and with an assumed right atrial pressure of 3 mmHg, the estimated right ventricular systolic pressure is [id number] mmHg. Left Atrium: Left atrial size was normal in size. Right Atrium: Right atrial size was normal in size. Pericardium: There is no evidence of pericardial effusion. Mitral Valve: The mitral valve is abnormal. There is mild thickening of the mitral valve leaflet(s). There is mild calcification of the mitral valve leaflet(s). Moderate mitral annular calcification. Trivial mitral valve regurgitation. No evidence of mitral valve stenosis. Tricuspid Valve: The tricuspid valve is normal in structure. Tricuspid valve regurgitation is mild . No evidence of tricuspid stenosis. Aortic Valve: The aortic valve is tricuspid. There is moderate calcification of the aortic valve. There is mild thickening of the aortic valve. Aortic valve regurgitation is trivial. Aortic valve sclerosis/calcification is present, without any evidence of  aortic stenosis. Aortic valve mean gradient measures 5.0 mmHg. Aortic valve peak gradient measures 8.9 mmHg. Aortic valve area, by VTI measures 2.10 cm. Pulmonic Valve: The pulmonic valve was grossly normal. Pulmonic valve regurgitation is not visualized. No evidence of pulmonic stenosis. Aorta: The aortic root, ascending aorta, aortic arch and descending aorta are all structurally normal, with no evidence of dilitation or obstruction. Venous: The inferior vena cava is normal in size with greater than 50% respiratory variability, suggesting right atrial pressure of 3 mmHg. IAS/Shunts: The atrial septum is grossly normal.  LEFT VENTRICLE PLAX 2D LVIDd:         5.05 cm      Diastology LVIDs:         3.95 cm      LV e' medial:    7.15 cm/s LV PW:         1.00 cm      LV E/e'  medial:  16.8 LV IVS:        1.05 cm      LV e' lateral:   8.70 cm/s LVOT diam:     1.90 cm      LV E/e' lateral: 13.8 LV SV:         62 LV SV Index:   39 LVOT Area:     2.84 cm                              3D Volume EF: LV Volumes (MOD)            3D EF:        59 % LV vol d, MOD A2C: 141.0 ml LV EDV:       114 ml LV vol d, MOD A4C: 134.0 ml LV ESV:       47 ml LV vol s, MOD A2C: 52.2 ml  LV SV:        67 ml LV vol s, MOD A4C: 48.5 ml LV SV MOD A2C:     88.8 ml LV SV MOD A4C:     134.0 ml LV SV MOD BP:      93.5 ml RIGHT VENTRICLE RV Basal diam:  3.50 cm RV Mid diam:    2.40 cm RV S prime:     18.80 cm/s TAPSE (M-mode): 3.0 cm LEFT ATRIUM           Index        RIGHT ATRIUM           Index LA diam:      3.50 cm 2.20 cm/m   RA Area:     12.90 cm LA Vol (A4C): 47.7 ml 30.02 ml/m  RA Volume:   29.60 ml  18.63 ml/m  AORTIC VALVE                    PULMONIC VALVE AV Area (Vmax):    2.25 cm     PV Vmax:       1.28 m/s AV Area (Vmean):   2.15 cm     PV Peak grad:  6.6 mmHg AV Area (VTI):     2.10 cm AV Vmax:           149.00 cm/s AV Vmean:          99.500 cm/s AV VTI:            0.297 m AV Peak Grad:      8.9 mmHg  AV Mean Grad:      5.0 mmHg LVOT Vmax:         118.00 cm/s LVOT Vmean:        75.600 cm/s LVOT VTI:          0.220 m LVOT/AV VTI ratio: 0.74  AORTA Ao Root diam: 3.10 cm Ao Asc diam:  3.50 cm MITRAL VALVE                TRICUSPID VALVE MV Area (PHT): 4.31 cm     TR Peak grad:   38.2 mmHg MV Decel Time: 176 msec     TR Vmax:        309.00 cm/s MR Peak grad: 26.8 mmHg MR Vmax:      259.00 cm/s   SHUNTS MV E velocity: 120.00 cm/s  Systemic VTI:  0.22 m MV A velocity: 113.00 cm/s  Systemic Diam: 1.90 cm MV E/A ratio:  1.06 Buford Dresser MD Electronically signed by Buford Dresser MD Signature Date/Time: 01/17/2023/2:55:12 PM    Final      Medical Consultants:   None.   Subjective:    Earlie Lou she continues to complain back pain.  Objective:    Vitals:   01/17/23 2139  01/17/23 2338 01/18/23 0558 01/18/23 0753  BP: (!) 103/58 (!) 124/90 125/82   Pulse: 81 (!) 110 61 (!) 126  Resp: 18 17 16   $ Temp: 99.4 F (37.4 C) 99.5 F (37.5 C) 98.8 F (37.1 C)   TempSrc: Oral Oral Oral   SpO2: 99% 98% 100%   Weight:      Height:       SpO2: 100 %   Intake/Output Summary (Last 24 hours) at 01/18/2023 0815 Last data filed at 01/18/2023 [id number] Gross per 24 hour  Intake 190.5 ml  Output 400 ml  Net -209.5 ml    Filed Weights   01/16/23 0145 01/17/23 1454 01/17/23 1814  Weight: 52.8 kg 53.4 kg 53 kg    Exam: General exam: In no acute distress. Respiratory system: Good air movement and clear to auscultation. Cardiovascular system: S1 & S2 heard, RRR. No JVD. Gastrointestinal system: Abdomen is nondistended, soft and nontender.  Extremities: No pedal edema. Skin: No rashes, lesions or ulcers Psychiatry: Judgement and insight appear normal. Mood & affect appropriate. Data Reviewed:    Labs: Basic Metabolic Panel: Recent Labs  Lab 01/15/23 1218 01/16/23 0112 01/17/23 1524  NA 136 135 135  K 5.0 4.4 4.2  CL 92* 92* 93*  CO2 27 23 23  $ GLUCOSE 82 63* 126*  BUN 46* 54* 76*  CREATININE 8.76* 9.31* 10.98*  CALCIUM 9.4 9.2 9.1  PHOS  --   --  8.3*    GFR Estimated Creatinine Clearance: 3.8 mL/min (A) (by C-G formula based on SCr of 10.98 mg/dL (H)). Liver Function Tests: Recent Labs  Lab 01/15/23 1218 01/17/23 1524  AST 31  --   ALT 6  --   ALKPHOS 52  --   BILITOT 1.0  --   PROT 7.0  --   ALBUMIN 2.6* 2.1*    No results for input(s): "LIPASE", "AMYLASE" in the last 168 hours. No results for input(s): "AMMONIA" in the last 168 hours. Coagulation profile No results for input(s): "INR", "PROTIME" in the last 168 hours. COVID-19 Labs  Recent Labs    01/15/23 1218  CRP 33.8*     Lab Results  Component Value Date   SARSCOV2NAA NEGATIVE 11/29/2022   [place] NEGATIVE 06/12/2022  [place] NEGATIVE 01/16/2022   [place]  NEGATIVE 12/28/2021    CBC: Recent Labs  Lab 01/15/23 1218 01/16/23 0112 01/17/23 1524  WBC 8.2 8.2 8.8  NEUTROABS 6.8  --   --   HGB 9.3* 9.6* 9.6*  HCT 28.7* 29.0* 27.9*  MCV 106.3* 102.8* 99.6  PLT 246 214 329    Cardiac Enzymes: No results for input(s): "CKTOTAL", "CKMB", "CKMBINDEX", "TROPONINI" in the last 168 hours. BNP (last 3 results) No results for input(s): "PROBNP" in the last 8760 hours. CBG: No results for input(s): "GLUCAP" in the last 168 hours. D-Dimer: No results for input(s): "DDIMER" in the last 72 hours. Hgb A1c: No results for input(s): "HGBA1C" in the last 72 hours. Lipid Profile: No results for input(s): "CHOL", "HDL", "LDLCALC", "TRIG", "CHOLHDL", "LDLDIRECT" in the last 72 hours. Thyroid function studies: No results for input(s): "TSH", "T4TOTAL", "T3FREE", "THYROIDAB" in the last 72 hours.  Invalid input(s): "FREET3" Anemia work up: No results for input(s): "VITAMINB12", "FOLATE", "FERRITIN", "TIBC", "IRON", "RETICCTPCT" in the last 72 hours. Sepsis Labs: Recent Labs  Lab 01/15/23 1218 01/15/23 1219 01/16/23 0112 01/17/23 1524  WBC 8.2  --  8.2 8.8  LATICACIDVEN  --  1.9  --   --     Microbiology Recent Results (from the past 240 hour(s))  Blood culture (routine x 2)     Status: Abnormal (Preliminary result)   Collection Time: 01/15/23  2:25 PM   Specimen: BLOOD  Result Value Ref Range Status   Specimen Description BLOOD SITE NOT SPECIFIED  Final   Special Requests   Final    BOTTLES DRAWN AEROBIC AND ANAEROBIC Blood Culture adequate volume   Culture  Setup Time   Final    ANAEROBIC BOTTLE ONLY GRAM POSITIVE COCCI IN CLUSTERS Organism ID to follow CRITICAL RESULT CALLED TO, READ BACK BY AND VERIFIED WITH:  C/ PHARMD C. PIERCE 01/16/23 1620 A. LAFRANCE    Culture (A)  Final    STAPHYLOCOCCUS AUREUS SUSCEPTIBILITIES TO FOLLOW Performed at [facility], [place] [address], [place], [place] [address]    Report Status PENDING   Incomplete  Blood Culture ID Panel (Reflexed)     Status: Abnormal   Collection Time: 01/15/23  2:25 PM  Result Value Ref Range Status   Enterococcus faecalis NOT DETECTED NOT DETECTED Final   Enterococcus Faecium NOT DETECTED NOT DETECTED Final   Listeria monocytogenes NOT DETECTED NOT DETECTED Final   Staphylococcus species DETECTED (A) NOT DETECTED Final    Comment: CRITICAL RESULT CALLED TO, READ BACK BY AND VERIFIED WITH:  C/ PHARMD C. PIERCE 01/16/23 1620 A. LAFRANCE    Staphylococcus aureus (BCID) DETECTED (A) NOT DETECTED Final    Comment: Methicillin (oxacillin)-resistant Staphylococcus aureus (MRSA). MRSA is predictably resistant to beta-lactam antibiotics (except ceftaroline). Preferred therapy is vancomycin unless clinically contraindicated. Patient requires contact precautions if  hospitalized. CRITICAL RESULT CALLED TO, READ BACK BY AND VERIFIED WITH:  C/ PHARMD C. PIERCE 01/16/23 1620 A. LAFRANCE    Staphylococcus epidermidis NOT DETECTED NOT DETECTED Final   Staphylococcus lugdunensis NOT DETECTED NOT DETECTED Final   Streptococcus species NOT DETECTED NOT DETECTED Final   Streptococcus agalactiae NOT DETECTED NOT DETECTED Final   Streptococcus pneumoniae NOT DETECTED NOT DETECTED Final   Streptococcus pyogenes NOT DETECTED NOT DETECTED Final   A.calcoaceticus-baumannii NOT DETECTED NOT DETECTED Final   Bacteroides fragilis NOT DETECTED NOT DETECTED Final   Enterobacterales NOT DETECTED NOT DETECTED Final   Enterobacter cloacae complex NOT DETECTED NOT DETECTED  Final   Escherichia coli NOT DETECTED NOT DETECTED Final   Klebsiella aerogenes NOT DETECTED NOT DETECTED Final   Klebsiella oxytoca NOT DETECTED NOT DETECTED Final   Klebsiella pneumoniae NOT DETECTED NOT DETECTED Final   Proteus species NOT DETECTED NOT DETECTED Final   Salmonella species NOT DETECTED NOT DETECTED Final   Serratia marcescens NOT DETECTED NOT DETECTED Final   Haemophilus influenzae NOT  DETECTED NOT DETECTED Final   Neisseria meningitidis NOT DETECTED NOT DETECTED Final   Pseudomonas aeruginosa NOT DETECTED NOT DETECTED Final   Stenotrophomonas maltophilia NOT DETECTED NOT DETECTED Final   Candida albicans NOT DETECTED NOT DETECTED Final   Candida auris NOT DETECTED NOT DETECTED Final   Candida glabrata NOT DETECTED NOT DETECTED Final   Candida krusei NOT DETECTED NOT DETECTED Final   Candida parapsilosis NOT DETECTED NOT DETECTED Final   Candida tropicalis NOT DETECTED NOT DETECTED Final   Cryptococcus neoformans/gattii NOT DETECTED NOT DETECTED Final   Meth resistant mecA/C and MREJ DETECTED (A) NOT DETECTED Final    Comment: CRITICAL RESULT CALLED TO, READ BACK BY AND VERIFIED WITH:  C/ PHARMD C. PIERCE 01/16/23 1620 A. LAFRANCE Performed at [facility], [place] [address], [place], [place] [address]   Culture, blood (Routine X 2) w Reflex to ID Panel     Status: None (Preliminary result)   Collection Time: 01/16/23  5:59 PM   Specimen: BLOOD RIGHT ARM  Result Value Ref Range Status   Specimen Description BLOOD RIGHT ARM  Final   Special Requests   Final    BOTTLES DRAWN AEROBIC AND ANAEROBIC Blood Culture results may not be optimal due to an inadequate volume of blood received in culture bottles   Culture   Final    NO GROWTH < 24 HOURS Performed at [facility], [place] [address], [place], [place] [address]    Report Status PENDING  Incomplete  Culture, blood (Routine X 2) w Reflex to ID Panel     Status: None (Preliminary result)   Collection Time: 01/16/23  5:59 PM   Specimen: BLOOD RIGHT ARM  Result Value Ref Range Status   Specimen Description BLOOD RIGHT ARM  Final   Special Requests   Final    BOTTLES DRAWN AEROBIC AND ANAEROBIC Blood Culture results may not be optimal due to an inadequate volume of blood received in culture bottles   Culture   Final    NO GROWTH < 24 HOURS Performed at [facility], [place] [address] [place], [place]  [address]    Report Status PENDING  Incomplete     Medications:    apixaban  2.5 mg Oral BID   Chlorhexidine Gluconate Cloth  6 each Topical Q0600   hydrALAZINE  50 mg Oral 3 times per day on Mon Wed Fri   And   hydrALAZINE  50 mg Oral 2 times per day on Sun Tue Thu Sat   metoprolol succinate  50 mg Oral Daily   pantoprazole  40 mg Oral Daily   sevelamer carbonate  1,600 mg Oral TID WC   Continuous Infusions:  vancomycin Stopped (01/17/23 1804)      LOS: 2 days   Charlynne Cousins  [facility]  01/18/2023, 8:15 AM

## 2023-01-18 NOTE — Progress Notes (Signed)
Patient ID: Diamond [name], female   DOB: [birth year], 72 y.o.   MRN: [id number] [facility] Progress Note   Assessment/ Plan:   1.  Recurrent MRSA infection with persistent osteomyelitis/discitis of L2-L3 and L4-L5: Seen by infectious disease with recommendations made for intravenous antimicrobial therapy with vancomycin for the next 8 weeks followed by lifelong suppressive antibiotics.  AV fistula physically without any evidence of infection.  Additional workup with TTE showed diastolic dysfunction and MV/TV abnormalities- TEE pending. 2.  End-stage renal disease: Continue hemodialysis on a Monday/Wednesday/Friday schedule via left brachiocephalic fistula.  She has had sub-par oral intake/appetite and is euvolemic (if not hypovolemic) on physical exam. 3.  Hypertension: Improving blood pressures noted, restarted hydralazine and will monitor with ultrafiltration/hemodialysis today. 4.  Acute metabolic encephalopathy: Secondary to acute infection +/- missed hemodialysis treatments.  Monitor with antibiotic therapy and dialysis. 5.  Anemia of chronic disease: Likely compounded by the ESA resistance in the setting of infection.  No overt losses noted and received ESA last week. 6.  Secondary hyperparathyroidism: Continue renal diet with resumption of phosphorus binders for phosphorus control.  Not on Sensipar or vitamin D receptor analog.   Subjective:   Fatigued overnight after hemodialysis yesterday in afternoon. Did not tolerate ultrafiltration goal due to hypotension and tachycardia.    Objective:   BP (!) 136/95   Pulse (!) 124   Temp 98.7 F (37.1 C) (Oral)   Resp 18   Ht 5' 6"$  (1.676 m)   Wt 53 kg   SpO2 100%   BMI 18.86 kg/m   Physical Exam: [id number] resting in bed, awakens to conversation.  [id number] irregular tachycardia, normal S1 and S2   Resp:Poor inspiratory effort without distinct rales/rhonchi [id number], flat, non-tender Ext:No lower extremity edema. L  BCF with intact dressings.   Labs: BMET Recent Labs  Lab 01/15/23 1218 01/16/23 0112 01/17/23 1524  NA 136 135 135  K 5.0 4.4 4.2  CL 92* 92* 93*  CO2 27 23 23  $ GLUCOSE 82 63* 126*  BUN 46* 54* 76*  CREATININE 8.76* 9.31* 10.98*  CALCIUM 9.4 9.2 9.1  PHOS  --   --  8.3*   CBC Recent Labs  Lab 01/15/23 1218 01/16/23 0112 01/17/23 1524  WBC 8.2 8.2 8.8  NEUTROABS 6.8  --   --   HGB 9.3* 9.6* 9.6*  HCT 28.7* 29.0* 27.9*  MCV 106.3* 102.8* 99.6  PLT 246 214 329     Medications:     apixaban  2.5 mg Oral BID   Chlorhexidine Gluconate Cloth  6 each Topical Q0600   hydrALAZINE  50 mg Oral 3 times per day on Mon Wed Fri   And   hydrALAZINE  50 mg Oral 2 times per day on Sun Tue Thu Sat   metoprolol succinate  100 mg Oral Daily   pantoprazole  40 mg Oral Daily   sevelamer carbonate  1,600 mg Oral TID WC   Elmarie Shiley, MD 01/18/2023, 9:29 AM

## 2023-01-18 NOTE — Progress Notes (Signed)
[facility] Infectious Diseases Follow Up Note  Patient Identification: Patient Name: Diamond [name] MRN: [id number] [place] Date: 01/15/2023 12:11 PM Age: 72 y.o.Today's Date: 01/18/2023  Reason for Visit: Fu on MRSA bacteremia   Principal Problem:   Acute on chronic osteomyelitis [facility]) Active Problems:   Hypertension   ESRD on hemodialysis ([place])   Paroxysmal atrial flutter (HCC)   Acute metabolic encephalopathy   Metabolic acidosis   Antibiotics: Vancomycin with HD 2/10-c  Lines/Hardwares: Left arm AVF   Interval Events: T max 101F, TTE 2/12 with mild thickening of the AV    Assessment # Recurrent MRSA bacteremia ( Jan 2023, July 2023, October 2023) 2/10 blood culture -MRSA 2/11 blood culture 2 x 2 sets -no growth in 2 days 2/12 TTE mild thickening of the aortic valve;  previously noted small mobile density during 0.5 x 0.9 cm in the RVOT seen on TTE 08/16/2022 not seen in subsequent TTE   # Persistent osteomyelitis/discitis at L2-L3 and L4-L5 with slight interval worsening and paravertebral inflammatory changes with superimposed phlegmom 2.3 cm heterogeneous area involving the left psoas muscle at the level of L3  -No drainable fluid collection in MRI L-spine 2/10   # ESRD om HD via left arm aVF - no signs of infection at aVF.  Evaluated by [facility] with no concerns # Chronic Hepatitis C - HCV RNA 941000 in 07/21/22, hep B surface ag NR in 01/17/23, HIV NR 10/2019, Liver enzymes 2/10 wnl   Recommendations Continue vancomycin, with HD Follow-up repeat blood cultures 2/11 for clearance TEE scheduled for 2/15 to r/o endocarditis  Will need prolonged IV abtx to be followed by PO suppression indefinitely  Monitor for metastatic sites of infection to guide further imaging as well as  worsening of back pain/neurological exam for repeat MRI L spine  Following   Rest of the management as per the primary team. Thank you for the  consult. Please page with pertinent questions or concerns.  ______________________________________________________________________ Subjective patient seen and examined at the bedside. Son at bedside.  Complains of lower back pain.  Denies any pain, tenderness or swelling at the left AVF.  Denies nausea, vomiting or abdominal pain or diarrhea.  Denies chest pain, cough and shortness of breath.   Past Medical History:  Diagnosis Date   Anemia    Asthma    Atrial flutter with rapid ventricular response ([place]) 12/25/2021   Chronic kidney disease    dialysis Tues Thurs Sat   Gout    Hypertension    Past Surgical History:  Procedure Laterality Date   AV FISTULA PLACEMENT Left 12/23/2021   Procedure: LEFT ARM BRACHIOBASILIC VEIN ARTERIOVENOUS (AV) FISTULA CREATION;  Surgeon: [name], MD;  Location: [place];  Service: Vascular;  Laterality: Left;   [place] Left 03/08/2022   Procedure: LEFT SECOND STAGE BASILIC VEIN TRANSPOSITION;  Surgeon: [name], MD;  Location: [facility] OR;  Service: Vascular;  Laterality: Left;   BIOPSY  01/19/2022   Procedure: BIOPSY;  Surgeon: Lavena Bullion, DO;  Location: [place] ENDOSCOPY;  Service: Gastroenterology;;   [place] N/A 06/25/2022   Procedure: OPEN CHOLECYSTECTOMY;  Surgeon: Dwan Bolt, MD;  Location: [place];  Service: General;  Laterality: N/A;   COLONOSCOPY Left 01/19/2022   Procedure: COLONOSCOPY;  Surgeon: Lavena Bullion, DO;  Location: [place];  Service: Gastroenterology;  Laterality: Left;   ESOPHAGOGASTRODUODENOSCOPY Left 01/19/2022   Procedure: ESOPHAGOGASTRODUODENOSCOPY (EGD);  Surgeon: Lavena Bullion,  DO;  Location: [place];  Service: Gastroenterology;  Laterality: Left;   INSERTION OF DIALYSIS CATHETER Right 12/23/2021   Procedure: INSERTION OF DIALYSIS CATHETER;  Surgeon: [name], MD;  Location: [place];  Service: Vascular;  Laterality: Right;   LAPAROSCOPY N/A  06/25/2022   Procedure: STAGING LAPAROSCOPY;  Surgeon: Dwan Bolt, MD;  Location: [place];  Service: General;  Laterality: N/A;   OPEN PARTIAL HEPATECTOMY  N/A 06/25/2022   Procedure: OPEN PARTIAL HEPATECTOMY;  Surgeon: Dwan Bolt, MD;  Location: [place];  Service: General;  Laterality: N/A;   POLYPECTOMY  01/19/2022   Procedure: POLYPECTOMY;  Surgeon: Lavena Bullion, DO;  Location: [place] ENDOSCOPY;  Service: Gastroenterology;;   TEE WITHOUT CARDIOVERSION N/A 12/22/2021   Procedure: TRANSESOPHAGEAL ECHOCARDIOGRAM (TEE);  Surgeon: Jerline Pain, MD;  Location: [facility] ENDOSCOPY;  Service: Cardiovascular;  Laterality: N/A;   TEE WITHOUT CARDIOVERSION N/A 06/17/2022   Procedure: TRANSESOPHAGEAL ECHOCARDIOGRAM (TEE);  Surgeon: Buford Dresser, MD;  Location: [place];  Service: Cardiovascular;  Laterality: N/A;   ULTRASOUND GUIDANCE FOR VASCULAR ACCESS  12/23/2021   Procedure: ULTRASOUND GUIDANCE FOR VASCULAR ACCESS;  Surgeon: [name], MD;  Location: [facility] OR;  Service: Vascular;;   Vitals BP (!) 152/100 (BP Location: Right Arm, Patient Position: Supine)   Pulse (!) 49   Temp 98.1 F (36.7 C) (Oral)   Resp 16   Ht 5' 6"$  (1.676 m)   Wt 53 kg   SpO2 100%   BMI 18.86 kg/m      Physical Exam Constitutional:  elderly think black lady lying in the bed, son at bedside    Comments: appears comfortable   Cardiovascular:     Rate and Rhythm: Normal rate and Irregular rhythm.     Heart sounds: murmur +   Pulmonary:     Effort: Pulmonary effort is normal on room air     Comments: Normal breath sounds   Abdominal:     Palpations: Abdomen is soft.     Tenderness: non distended and non tender   Musculoskeletal:        General: No swelling or tenderness in peripheral joints.  Skin:    Comments: No obvious rashes, left AVF with no swelling/redness/tenderness or fluctuance   Neurological:     General: grossly non focal, awake, alert and oriented, follows commands    Psychiatric:        Mood and Affect: Mood normal.   Pertinent Microbiology Results for orders placed or performed during the hospital encounter of 01/15/23  Blood culture (routine x 2)     Status: Abnormal   Collection Time: 01/15/23  2:25 PM   Specimen: BLOOD  Result Value Ref Range Status   Specimen Description BLOOD SITE NOT SPECIFIED  Final   Special Requests   Final    BOTTLES DRAWN AEROBIC AND ANAEROBIC Blood Culture adequate volume   Culture  Setup Time   Final    ANAEROBIC BOTTLE ONLY GRAM POSITIVE COCCI IN CLUSTERS Organism ID to follow CRITICAL RESULT CALLED TO, READ BACK BY AND VERIFIED WITH:  C/ PHARMD C. PIERCE 01/16/23 1620 A. LAFRANCE Performed at [facility], [place] [address] [place], [place] [address]    Culture METHICILLIN RESISTANT STAPHYLOCOCCUS AUREUS (A)  Final   Report Status 01/18/2023 FINAL  Final   Organism ID, Bacteria METHICILLIN RESISTANT STAPHYLOCOCCUS AUREUS  Final      Susceptibility   Methicillin resistant staphylococcus aureus - MIC*    CIPROFLOXACIN <=0.5 SENSITIVE  Sensitive     ERYTHROMYCIN <=0.25 SENSITIVE Sensitive     GENTAMICIN <=0.5 SENSITIVE Sensitive     OXACILLIN >=4 RESISTANT Resistant     TETRACYCLINE >=16 RESISTANT Resistant     VANCOMYCIN 1 SENSITIVE Sensitive     TRIMETH/SULFA <=10 SENSITIVE Sensitive     CLINDAMYCIN <=0.25 SENSITIVE Sensitive     RIFAMPIN <=0.5 SENSITIVE Sensitive     Inducible Clindamycin NEGATIVE Sensitive     * METHICILLIN RESISTANT STAPHYLOCOCCUS AUREUS  Blood Culture ID Panel (Reflexed)     Status: Abnormal   Collection Time: 01/15/23  2:25 PM  Result Value Ref Range Status   Enterococcus faecalis NOT DETECTED NOT DETECTED Final   Enterococcus Faecium NOT DETECTED NOT DETECTED Final   Listeria monocytogenes NOT DETECTED NOT DETECTED Final   Staphylococcus species DETECTED (A) NOT DETECTED Final    Comment: CRITICAL RESULT CALLED TO, READ BACK BY AND VERIFIED WITH:  C/ PHARMD C. PIERCE  01/16/23 1620 A. LAFRANCE    Staphylococcus aureus (BCID) DETECTED (A) NOT DETECTED Final    Comment: Methicillin (oxacillin)-resistant Staphylococcus aureus (MRSA). MRSA is predictably resistant to beta-lactam antibiotics (except ceftaroline). Preferred therapy is vancomycin unless clinically contraindicated. Patient requires contact precautions if  hospitalized. CRITICAL RESULT CALLED TO, READ BACK BY AND VERIFIED WITH:  C/ PHARMD C. PIERCE 01/16/23 1620 A. LAFRANCE    Staphylococcus epidermidis NOT DETECTED NOT DETECTED Final   Staphylococcus lugdunensis NOT DETECTED NOT DETECTED Final   Streptococcus species NOT DETECTED NOT DETECTED Final   Streptococcus agalactiae NOT DETECTED NOT DETECTED Final   Streptococcus pneumoniae NOT DETECTED NOT DETECTED Final   Streptococcus pyogenes NOT DETECTED NOT DETECTED Final   A.calcoaceticus-baumannii NOT DETECTED NOT DETECTED Final   Bacteroides fragilis NOT DETECTED NOT DETECTED Final   Enterobacterales NOT DETECTED NOT DETECTED Final   Enterobacter cloacae complex NOT DETECTED NOT DETECTED Final   Escherichia coli NOT DETECTED NOT DETECTED Final   Klebsiella aerogenes NOT DETECTED NOT DETECTED Final   Klebsiella oxytoca NOT DETECTED NOT DETECTED Final   Klebsiella pneumoniae NOT DETECTED NOT DETECTED Final   Proteus species NOT DETECTED NOT DETECTED Final   Salmonella species NOT DETECTED NOT DETECTED Final   Serratia marcescens NOT DETECTED NOT DETECTED Final   Haemophilus influenzae NOT DETECTED NOT DETECTED Final   Neisseria meningitidis NOT DETECTED NOT DETECTED Final   Pseudomonas aeruginosa NOT DETECTED NOT DETECTED Final   Stenotrophomonas maltophilia NOT DETECTED NOT DETECTED Final   Candida albicans NOT DETECTED NOT DETECTED Final   Candida auris NOT DETECTED NOT DETECTED Final   Candida glabrata NOT DETECTED NOT DETECTED Final   Candida krusei NOT DETECTED NOT DETECTED Final   Candida parapsilosis NOT DETECTED NOT DETECTED Final    Candida tropicalis NOT DETECTED NOT DETECTED Final   Cryptococcus neoformans/gattii NOT DETECTED NOT DETECTED Final   Meth resistant mecA/C and MREJ DETECTED (A) NOT DETECTED Final    Comment: CRITICAL RESULT CALLED TO, READ BACK BY AND VERIFIED WITH:  C/ PHARMD C. PIERCE 01/16/23 1620 A. LAFRANCE Performed at [facility], [place] [address], [place], [place] [address]   Culture, blood (Routine X 2) w Reflex to ID Panel     Status: None (Preliminary result)   Collection Time: 01/16/23  5:59 PM   Specimen: BLOOD RIGHT ARM  Result Value Ref Range Status   Specimen Description BLOOD RIGHT ARM  Final   Special Requests   Final    BOTTLES DRAWN AEROBIC AND ANAEROBIC Blood Culture results may not  be optimal due to an inadequate volume of blood received in culture bottles   Culture   Final    NO GROWTH 2 DAYS Performed at [facility], [place] [address] [place], [place] [address]    Report Status PENDING  Incomplete  Culture, blood (Routine X 2) w Reflex to ID Panel     Status: None (Preliminary result)   Collection Time: 01/16/23  5:59 PM   Specimen: BLOOD RIGHT ARM  Result Value Ref Range Status   Specimen Description BLOOD RIGHT ARM  Final   Special Requests   Final    BOTTLES DRAWN AEROBIC AND ANAEROBIC Blood Culture results may not be optimal due to an inadequate volume of blood received in culture bottles   Culture   Final    NO GROWTH 2 DAYS Performed at [facility], [place] [address], [place], [place] [address]    Report Status PENDING  Incomplete    Pertinent Lab.    Latest Ref Rng & Units 01/17/2023    3:24 PM 01/16/2023    1:12 AM 01/15/2023   12:18 PM  CBC  WBC 4.0 - 10.5 K/uL 8.8  8.2  8.2   Hemoglobin 12.0 - 15.0 g/dL 9.6  9.6  9.3   Hematocrit 36.0 - 46.0 % 27.9  29.0  28.7   Platelets 150 - 400 K/uL 329  214  246       Latest Ref Rng & Units 01/17/2023    3:24 PM 01/16/2023    1:12 AM 01/15/2023   12:18 PM  CMP  Glucose 70 - 99 mg/dL 126  63  82    BUN 8 - 23 mg/dL 76  54  46   Creatinine 0.44 - 1.00 mg/dL 10.98  9.31  8.76   Sodium 135 - 145 mmol/L 135  135  136   Potassium 3.5 - 5.1 mmol/L 4.2  4.4  5.0   Chloride 98 - 111 mmol/L 93  92  92   CO2 22 - 32 mmol/L 23  23  27   $ Calcium 8.9 - 10.3 mg/dL 9.1  9.2  9.4   Total Protein 6.5 - 8.1 g/dL   7.0   Total Bilirubin 0.3 - 1.2 mg/dL   1.0   Alkaline Phos 38 - 126 U/L   52   AST 15 - 41 U/L   31   ALT 0 - 44 U/L   6      Pertinent Imaging today Plain films and CT images have been personally visualized and interpreted; radiology reports have been reviewed. Decision making incorporated into the Impression / Recommendations.  ECHOCARDIOGRAM COMPLETE  Result Date: 01/17/2023    ECHOCARDIOGRAM REPORT   Patient Name:   Diamond [name] Date of Exam: 01/17/2023 Medical Rec #:  [id number]      Height:       66.0 in Accession #:    [id number]     Weight:       116.4 lb Date of Birth:  1951/06/25     BSA:          1.589 m Patient Age:    6 years       BP:           181/97 mmHg Patient Gender: F              HR:           80 bpm. Exam Location:  Inpatient Procedure: 2D Echo Indications:  bacteremia  History:        Patient has prior history of Echocardiogram examinations, most                 recent 09/22/2022.  Sonographer:    Harvie Junior Referring Phys: March ARB  1. Left ventricular ejection fraction, by estimation, is 60 to 65%. The left ventricle has normal function. The left ventricle has no regional wall motion abnormalities. Left ventricular diastolic parameters are consistent with Grade II diastolic dysfunction (pseudonormalization).  2. Right ventricular systolic function is normal. The right ventricular size is normal. There is mildly elevated pulmonary artery systolic pressure.  3. The mitral valve is abnormal. Trivial mitral valve regurgitation. No evidence of mitral stenosis. Moderate mitral annular calcification.  4. The aortic valve is tricuspid. There is  moderate calcification of the aortic valve. There is mild thickening of the aortic valve. Aortic valve regurgitation is trivial. Aortic valve sclerosis/calcification is present, without any evidence of aortic stenosis.  5. The inferior vena cava is normal in size with greater than 50% respiratory variability, suggesting right atrial pressure of 3 mmHg. Comparison(s): No significant change from prior study. Conclusion(s)/Recommendation(s): No evidence of valvular vegetations on this transthoracic echocardiogram. Consider a transesophageal echocardiogram to exclude infective endocarditis if clinically indicated. FINDINGS  Left Ventricle: Left ventricular ejection fraction, by estimation, is 60 to 65%. The left ventricle has normal function. The left ventricle has no regional wall motion abnormalities. The left ventricular internal cavity size was normal in size. There is  borderline left ventricular hypertrophy. Left ventricular diastolic parameters are consistent with Grade II diastolic dysfunction (pseudonormalization). Right Ventricle: The right ventricular size is normal. No increase in right ventricular wall thickness. Right ventricular systolic function is normal. There is mildly elevated pulmonary artery systolic pressure. The tricuspid regurgitant velocity is 3.09  m/s, and with an assumed right atrial pressure of 3 mmHg, the estimated right ventricular systolic pressure is [id number] mmHg. Left Atrium: Left atrial size was normal in size. Right Atrium: Right atrial size was normal in size. Pericardium: There is no evidence of pericardial effusion. Mitral Valve: The mitral valve is abnormal. There is mild thickening of the mitral valve leaflet(s). There is mild calcification of the mitral valve leaflet(s). Moderate mitral annular calcification. Trivial mitral valve regurgitation. No evidence of mitral valve stenosis. Tricuspid Valve: The tricuspid valve is normal in structure. Tricuspid valve regurgitation is mild .  No evidence of tricuspid stenosis. Aortic Valve: The aortic valve is tricuspid. There is moderate calcification of the aortic valve. There is mild thickening of the aortic valve. Aortic valve regurgitation is trivial. Aortic valve sclerosis/calcification is present, without any evidence of aortic stenosis. Aortic valve mean gradient measures 5.0 mmHg. Aortic valve peak gradient measures 8.9 mmHg. Aortic valve area, by VTI measures 2.10 cm. Pulmonic Valve: The pulmonic valve was grossly normal. Pulmonic valve regurgitation is not visualized. No evidence of pulmonic stenosis. Aorta: The aortic root, ascending aorta, aortic arch and descending aorta are all structurally normal, with no evidence of dilitation or obstruction. Venous: The inferior vena cava is normal in size with greater than 50% respiratory variability, suggesting right atrial pressure of 3 mmHg. IAS/Shunts: The atrial septum is grossly normal.  LEFT VENTRICLE PLAX 2D LVIDd:         5.05 cm      Diastology LVIDs:         3.95 cm      LV e' medial:    7.15 cm/s  LV PW:         1.00 cm      LV E/e' medial:  16.8 LV IVS:        1.05 cm      LV e' lateral:   8.70 cm/s LVOT diam:     1.90 cm      LV E/e' lateral: 13.8 LV SV:         62 LV SV Index:   39 LVOT Area:     2.84 cm                              3D Volume EF: LV Volumes (MOD)            3D EF:        59 % LV vol d, MOD A2C: 141.0 ml LV EDV:       114 ml LV vol d, MOD A4C: 134.0 ml LV ESV:       47 ml LV vol s, MOD A2C: 52.2 ml  LV SV:        67 ml LV vol s, MOD A4C: 48.5 ml LV SV MOD A2C:     88.8 ml LV SV MOD A4C:     134.0 ml LV SV MOD BP:      93.5 ml RIGHT VENTRICLE RV Basal diam:  3.50 cm RV Mid diam:    2.40 cm RV S prime:     18.80 cm/s TAPSE (M-mode): 3.0 cm LEFT ATRIUM           Index        RIGHT ATRIUM           Index LA diam:      3.50 cm 2.20 cm/m   RA Area:     12.90 cm LA Vol (A4C): 47.7 ml 30.02 ml/m  RA Volume:   29.60 ml  18.63 ml/m  AORTIC VALVE                    PULMONIC  VALVE AV Area (Vmax):    2.25 cm     PV Vmax:       1.28 m/s AV Area (Vmean):   2.15 cm     PV Peak grad:  6.6 mmHg AV Area (VTI):     2.10 cm AV Vmax:           149.00 cm/s AV Vmean:          99.500 cm/s AV VTI:            0.297 m AV Peak Grad:      8.9 mmHg AV Mean Grad:      5.0 mmHg LVOT Vmax:         118.00 cm/s LVOT Vmean:        75.600 cm/s LVOT VTI:          0.220 m LVOT/AV VTI ratio: 0.74  AORTA Ao Root diam: 3.10 cm Ao Asc diam:  3.50 cm MITRAL VALVE                TRICUSPID VALVE MV Area (PHT): 4.31 cm     TR Peak grad:   38.2 mmHg MV Decel Time: 176 msec     TR Vmax:        309.00 cm/s MR Peak grad: 26.8 mmHg MR Vmax:      259.00 cm/s   SHUNTS MV E velocity: 120.00 cm/s  Systemic VTI:  0.22 m MV A velocity: 113.00 cm/s  Systemic Diam: 1.90 cm MV E/A ratio:  1.06 Buford Dresser MD Electronically signed by Buford Dresser MD Signature Date/Time: 01/17/2023/2:55:12 PM    Final    MR LUMBAR SPINE WO CONTRAST  Result Date: 01/15/2023 CLINICAL DATA:  Initial evaluation for worsened lower back pain. EXAM: MRI LUMBAR SPINE WITHOUT CONTRAST TECHNIQUE: Multiplanar, multisequence MR imaging of the lumbar spine was performed. No intravenous contrast was administered. COMPARISON:  Previous MRI from 11/30/2022. FINDINGS: Segmentation: Transitional lumbosacral anatomy with sacralization of L5. Same numbering system employed as on previous exam. Alignment: Stable trace anterolisthesis of L1 on L2 and L3 on L4. Underlying mild scoliosis. Vertebrae: Persistent endplate irregularity with decreased T1 signal intensity, and marrow edema about the L2-3 and L4-5 interspaces. Similar intra discal fluid signal intensity at L2-3 and L3-4. Underlying chronic L2 and L3 compression deformities, stable. No acute or interval fracture. Bone marrow signal intensity heterogeneous without worrisome osseous lesion. Conus medullaris and cauda equina: Conus extends to the L1 level. Conus and cauda equina appear normal.  Soft tissue material within the ventral epidural space at L3 suspicious for phlegmon, similar to prior. Paraspinal and other soft tissues: Paravertebral edema and inflammatory changes seen involving the left greater than right psoas musculature. Overall changes are slightly worsened from prior. Superimposed heterogeneous T2 hypointense area measuring 2.3 cm within the left psoas muscle is increased from prior, likely phlegmon. No drainable soft tissue fluid collections. Renal atrophy with multiple bilateral simple cysts, consistent with history of end-stage renal disease. No follow-up imaging recommended. Atherosclerotic change noted within the aorta. Disc levels: L1-2: Diffuse disc bulge with endplate spurring. Mild facet hypertrophy. Unchanged mild narrowing of the lateral recesses. Central canal remains patent. Mild left L1 foraminal narrowing. Right neural foramina remains patent. L2-3: Mild disc bulge. Mild to moderate facet hypertrophy with ligament flavum thickening. Resultant mild-to-moderate canal with bilateral subarticular stenosis. Mild bilateral L2 foraminal narrowing. Appearance is relatively stable. L3-4: Diffuse disc bulge. Moderate facet and ligament flavum hypertrophy. Superimposed epidural phlegmon within the ventral epidural space at L3. Resultant severe spinal stenosis. Mild to moderate left with mild right L3 foraminal narrowing. Appearance is stable. L4-5: Severe intervertebral disc space narrowing with diffuse disc osteophyte complex. Superimposed small central disc protrusion with caudad angulation. Right worse than left facet hypertrophy. Moderate spinal stenosis with moderate to severe bilateral lateral recess narrowing, stable. Mild-to-moderate left L4 foraminal narrowing. Right neural foramen remains patent. L5-S1: Transitional segment. No disc bulge or focal disc herniation. No stenosis. IMPRESSION: 1. Persistent findings concerning for osteomyelitis discitis at L2-3 and L4-5 with  slight interval worsening in paravertebral inflammatory changes as compared to previous MRI from 11/30/2022. Superimposed 2.3 cm heterogeneous area of involving the left psoas muscle at the level of L3 likely reflects phlegmon. No drainable fluid collections identified. 2. Underlying moderate multilevel spondylosis and facet arthrosis with moderate to severe canal and lateral recess narrowing at L2-3 through L5-S1 as above. Appearance is not significantly changed as compared to prior. Electronically Signed   By: Jeannine Boga M.D.   On: 01/15/2023 21:59   DG Chest Port 1 View  Result Date: 01/15/2023 CLINICAL DATA:  Weakness and pain. EXAM: PORTABLE CHEST 1 VIEW COMPARISON:  11/30/2022 FINDINGS: Telemetry leads overlie the chest. UPPER limits normal heart size again noted. The mediastinal silhouette is unchanged. There is no evidence of focal airspace disease, pulmonary edema, suspicious pulmonary nodule/mass, pleural effusion, or pneumothorax. No acute bony abnormalities are identified.  IMPRESSION: No active disease. Electronically Signed   By: Margarette Canada M.D.   On: 01/15/2023 13:17    I spent 55  minutes for this patient encounter including review of prior medical records, coordination of care with primary/other specialist with greater than 50% of time being face to face/counseling and discussing diagnostics/treatment plan with the patient/family.  Electronically signed by:   Rosiland Oz, MD Infectious Disease Physician [facility] for Infectious Disease Pager: [phone]

## 2023-01-19 ENCOUNTER — Encounter (HOSPITAL_COMMUNITY): Payer: Self-pay | Admitting: Family Medicine

## 2023-01-19 ENCOUNTER — Encounter (HOSPITAL_COMMUNITY): Payer: Self-pay | Admitting: Critical Care Medicine

## 2023-01-19 DIAGNOSIS — M8660 Other chronic osteomyelitis, unspecified site: Secondary | ICD-10-CM | POA: Diagnosis not present

## 2023-01-19 DIAGNOSIS — M8610 Other acute osteomyelitis, unspecified site: Secondary | ICD-10-CM | POA: Diagnosis not present

## 2023-01-19 DIAGNOSIS — I4892 Unspecified atrial flutter: Secondary | ICD-10-CM | POA: Diagnosis not present

## 2023-01-19 LAB — RENAL FUNCTION PANEL
Albumin: 2.2 g/dL — ABNORMAL LOW (ref 3.5–5.0)
Anion gap: 19 — ABNORMAL HIGH (ref 5–15)
BUN: 54 mg/dL — ABNORMAL HIGH (ref 8–23)
CO2: 24 mmol/L (ref 22–32)
Calcium: 9.8 mg/dL (ref 8.9–10.3)
Chloride: 88 mmol/L — ABNORMAL LOW (ref 98–111)
Creatinine, Ser: 7.49 mg/dL — ABNORMAL HIGH (ref 0.44–1.00)
GFR, Estimated: 5 mL/min — ABNORMAL LOW (ref >60)
Glucose, Bld: 83 mg/dL (ref 70–99)
Phosphorus: 7 mg/dL — ABNORMAL HIGH (ref 2.5–4.6)
Potassium: 4.2 mmol/L (ref 3.5–5.1)
Sodium: 131 mmol/L — ABNORMAL LOW (ref 135–145)

## 2023-01-19 LAB — CBC
HCT: 28.9 % — ABNORMAL LOW (ref 36.0–46.0)
Hemoglobin: 9.7 g/dL — ABNORMAL LOW (ref 12.0–15.0)
MCH: 33.2 pg (ref 26.0–34.0)
MCHC: 33.6 g/dL (ref 30.0–36.0)
MCV: 99 fL (ref 80.0–100.0)
Platelets: 401 10*3/uL — ABNORMAL HIGH (ref 150–400)
RBC: 2.92 MIL/uL — ABNORMAL LOW (ref 3.87–5.11)
RDW: 20.2 % — ABNORMAL HIGH (ref 11.5–15.5)
WBC: 8.2 10*3/uL (ref 4.0–10.5)
nRBC: 0 % (ref 0.0–0.2)

## 2023-01-19 MED ORDER — METOPROLOL TARTRATE 25 MG PO TABS
25.0000 mg | ORAL_TABLET | Freq: Two times a day (BID) | ORAL | Status: DC
  Filled 2023-01-19: qty 1

## 2023-01-19 MED ORDER — AMIODARONE HCL IN DEXTROSE 360-4.14 MG/200ML-% IV SOLN
30.0000 mg/h | INTRAVENOUS | Status: DC
  Administered 2023-01-20: 30 mg/h via INTRAVENOUS
  Filled 2023-01-19: qty 200

## 2023-01-19 MED ORDER — AMIODARONE HCL IN DEXTROSE 360-4.14 MG/200ML-% IV SOLN
60.0000 mg/h | INTRAVENOUS | Status: DC
  Administered 2023-01-19: 60 mg/h via INTRAVENOUS
  Filled 2023-01-19: qty 200

## 2023-01-19 MED ORDER — DILTIAZEM HCL 30 MG PO TABS: 30.0000 mg | ORAL_TABLET | Freq: Four times a day (QID) | ORAL | Status: DC

## 2023-01-19 MED ORDER — APIXABAN 2.5 MG PO TABS
2.5000 mg | ORAL_TABLET | Freq: Two times a day (BID) | ORAL | Status: DC
  Administered 2023-01-19 – 2023-01-25 (×10): 2.5 mg via ORAL
  Filled 2023-01-19 (×12): qty 1

## 2023-01-19 MED ORDER — NITROGLYCERIN 0.4 MG SL SUBL: 0.4000 mg | SUBLINGUAL_TABLET | SUBLINGUAL | Status: DC | PRN

## 2023-01-19 MED ORDER — AMIODARONE LOAD VIA INFUSION
150.0000 mg | Freq: Once | INTRAVENOUS | Status: AC
  Administered 2023-01-19: 150 mg via INTRAVENOUS
  Filled 2023-01-19: qty 83.34

## 2023-01-19 MED ORDER — METOPROLOL TARTRATE 50 MG PO TABS: 50.0000 mg | ORAL_TABLET | Freq: Four times a day (QID) | ORAL | Status: DC

## 2023-01-19 NOTE — Progress Notes (Signed)
Pt pulse rate 128 at present. Pt will need to be transferred off 6N to a progressive cardiac dept due to cardiology initiating amiodarone drip on pt. Rapid Response made aware.

## 2023-01-19 NOTE — Progress Notes (Signed)
[facility] PROGRESS NOTE    Progress Note  [name]  [id number] DOB: [birth year] DOA: 01/15/2023 PCP: [facility] [place]     Brief Narrative:   Diamond [name] is an 72 y.o. 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  MRI of the lumbar spine showed persistent discitis L2-L3 and L4-5 with inflammatory changes compared to previous MRI superimposed,slightly worsened compared to previous MRI.  CRP is 33 ESR is 133.  TEE scheduled for 01/11/2024  Assessment/Plan:   Persistent Acute on chronic osteomyelitis (HCC)/history of bacteremia: Continue IV vancomycin. Infectious disease was consulted recommended TEE scheduled for 01/21/2024 Also recommended to repeat an MRI in 1 to 2 weeks to see if the phlegmon has developed into an abscess. Will need a weeks of IV vancomycin followed by long life suppression and oral therapy. Continue to monitor ESR and CRP. Surveillance blood cultures on 01/16/2023 have been negative till date.  Acute metabolic encephalopathy Due to infectious etiology. Vitals are stable except for her blood pressure was slightly elevated. Remained afebrile with no leukocytosis. Seems to have improved.  End-stage renal disease on hemodialysis: Renal has been notified.  A-fib with RVR: She is getting tachycardic every time she gets dialysis, continue metoprolol 50 mg daily. IV metoprolol as needed for heart rate greater than 100, she mostly needed during dialysis. Will add diltiazem low-dose every 6 hours continue to monitor on telemetry.  Essential hypertension: Continue hydralazine, continue clonidine as needed.  Metabolic acidosis Probably secondary to worsening renal  function and missed dialysis renal has been notified. She was given a liter of normal saline in the ED bicarb is improved.  Severe protein caloric malnutrition: Continue Ensure 3 times daily patient is cachectic.   DVT prophylaxis: lovenox Family Communication:non Status is: Inpatient Remains inpatient appropriate because: Acute on chronic osteomyelitis    Code Status:     Code Status Orders  (From admission, onward)           Start     Ordered   01/16/23 0041  Full code  Continuous       Question:  By:  Answer:  Consent: discussion documented in EHR   01/16/23 0041           Code Status History     Date Active Date Inactive Code Status Order ID Comments User Context   11/30/2022 0416 12/08/2022 1911 Full Code [id number]  Idamae Schuller, MD ED   09/17/2022 0159 10/02/2022 0618 Full Code [id number]  Etta Quill, DO ED   07/26/2022 1656 07/30/2022 0040 Full Code [id number]  Karmen Bongo, MD ED   06/12/2022 2354 06/30/2022 2214 Full Code [id number]  Kayleen Memos, DO ED   02/05/2022 1857 02/06/2022 2316 Full Code [id number]  Orma Flaming, MD ED   01/16/2022 2258 01/22/2022 2041 Full Code [id number]  Shalhoub, Sherryll Burger, MD ED   12/17/2021 0259 12/30/2021 2132 Full Code [id number]  Howerter, Ethelda Chick, DO ED   11/05/2019 2206 11/13/2019 0127 Full Code [id number]  Toy Baker, MD Inpatient   10/17/2019 0444 10/21/2019 1629 Full Code [id number]  Reubin Milan, MD ED   10/16/2019 2110 10/17/2019 0444 Full Code [id number]  Reubin Milan, MD ED         IV Access:   Peripheral IV   Procedures and diagnostic  studies:   ECHOCARDIOGRAM COMPLETE  Result Date: 01/17/2023    ECHOCARDIOGRAM REPORT   Patient Name:   Diamond [name] Date of Exam: 01/17/2023 Medical Rec #:  [id number]      Height:       66.0 in Accession #:    [id number]     Weight:       116.4 lb Date of Birth:  May 21, 1951     BSA:          1.589 m Patient Age:    37 years       BP:           181/97 mmHg  Patient Gender: F              HR:           80 bpm. Exam Location:  Inpatient Procedure: 2D Echo Indications:    bacteremia  History:        Patient has prior history of Echocardiogram examinations, most                 recent 09/22/2022.  Sonographer:    Harvie Junior Referring Phys: [place]  1. Left ventricular ejection fraction, by estimation, is 60 to 65%. The left ventricle has normal function. The left ventricle has no regional wall motion abnormalities. Left ventricular diastolic parameters are consistent with Grade II diastolic dysfunction (pseudonormalization).  2. Right ventricular systolic function is normal. The right ventricular size is normal. There is mildly elevated pulmonary artery systolic pressure.  3. The mitral valve is abnormal. Trivial mitral valve regurgitation. No evidence of mitral stenosis. Moderate mitral annular calcification.  4. The aortic valve is tricuspid. There is moderate calcification of the aortic valve. There is mild thickening of the aortic valve. Aortic valve regurgitation is trivial. Aortic valve sclerosis/calcification is present, without any evidence of aortic stenosis.  5. The inferior vena cava is normal in size with greater than 50% respiratory variability, suggesting right atrial pressure of 3 mmHg. Comparison(s): No significant change from prior study. Conclusion(s)/Recommendation(s): No evidence of valvular vegetations on this transthoracic echocardiogram. Consider a transesophageal echocardiogram to exclude infective endocarditis if clinically indicated. FINDINGS  Left Ventricle: Left ventricular ejection fraction, by estimation, is 60 to 65%. The left ventricle has normal function. The left ventricle has no regional wall motion abnormalities. The left ventricular internal cavity size was normal in size. There is  borderline left ventricular hypertrophy. Left ventricular diastolic parameters are consistent with Grade II diastolic  dysfunction (pseudonormalization). Right Ventricle: The right ventricular size is normal. No increase in right ventricular wall thickness. Right ventricular systolic function is normal. There is mildly elevated pulmonary artery systolic pressure. The tricuspid regurgitant velocity is 3.09  m/s, and with an assumed right atrial pressure of 3 mmHg, the estimated right ventricular systolic pressure is [id number] mmHg. Left Atrium: Left atrial size was normal in size. Right Atrium: Right atrial size was normal in size. Pericardium: There is no evidence of pericardial effusion. Mitral Valve: The mitral valve is abnormal. There is mild thickening of the mitral valve leaflet(s). There is mild calcification of the mitral valve leaflet(s). Moderate mitral annular calcification. Trivial mitral valve regurgitation. No evidence of mitral valve stenosis. Tricuspid Valve: The tricuspid valve is normal in structure. Tricuspid valve regurgitation is mild . No evidence of tricuspid stenosis. Aortic Valve: The aortic valve is tricuspid. There is moderate calcification of the aortic valve. There is mild thickening of the aortic valve. Aortic valve  regurgitation is trivial. Aortic valve sclerosis/calcification is present, without any evidence of aortic stenosis. Aortic valve mean gradient measures 5.0 mmHg. Aortic valve peak gradient measures 8.9 mmHg. Aortic valve area, by VTI measures 2.10 cm. Pulmonic Valve: The pulmonic valve was grossly normal. Pulmonic valve regurgitation is not visualized. No evidence of pulmonic stenosis. Aorta: The aortic root, ascending aorta, aortic arch and descending aorta are all structurally normal, with no evidence of dilitation or obstruction. Venous: The inferior vena cava is normal in size with greater than 50% respiratory variability, suggesting right atrial pressure of 3 mmHg. IAS/Shunts: The atrial septum is grossly normal.  LEFT VENTRICLE PLAX 2D LVIDd:         5.05 cm      Diastology LVIDs:          3.95 cm      LV e' medial:    7.15 cm/s LV PW:         1.00 cm      LV E/e' medial:  16.8 LV IVS:        1.05 cm      LV e' lateral:   8.70 cm/s LVOT diam:     1.90 cm      LV E/e' lateral: 13.8 LV SV:         62 LV SV Index:   39 LVOT Area:     2.84 cm                              3D Volume EF: LV Volumes (MOD)            3D EF:        59 % LV vol d, MOD A2C: 141.0 ml LV EDV:       114 ml LV vol d, MOD A4C: 134.0 ml LV ESV:       47 ml LV vol s, MOD A2C: 52.2 ml  LV SV:        67 ml LV vol s, MOD A4C: 48.5 ml LV SV MOD A2C:     88.8 ml LV SV MOD A4C:     134.0 ml LV SV MOD BP:      93.5 ml RIGHT VENTRICLE RV Basal diam:  3.50 cm RV Mid diam:    2.40 cm RV S prime:     18.80 cm/s TAPSE (M-mode): 3.0 cm LEFT ATRIUM           Index        RIGHT ATRIUM           Index LA diam:      3.50 cm 2.20 cm/m   RA Area:     12.90 cm LA Vol (A4C): 47.7 ml 30.02 ml/m  RA Volume:   29.60 ml  18.63 ml/m  AORTIC VALVE                    PULMONIC VALVE AV Area (Vmax):    2.25 cm     PV Vmax:       1.28 m/s AV Area (Vmean):   2.15 cm     PV Peak grad:  6.6 mmHg AV Area (VTI):     2.10 cm AV Vmax:           149.00 cm/s AV Vmean:          99.500 cm/s AV VTI:  0.297 m AV Peak Grad:      8.9 mmHg AV Mean Grad:      5.0 mmHg LVOT Vmax:         118.00 cm/s LVOT Vmean:        75.600 cm/s LVOT VTI:          0.220 m LVOT/AV VTI ratio: 0.74  AORTA Ao Root diam: 3.10 cm Ao Asc diam:  3.50 cm MITRAL VALVE                TRICUSPID VALVE MV Area (PHT): 4.31 cm     TR Peak grad:   38.2 mmHg MV Decel Time: 176 msec     TR Vmax:        309.00 cm/s MR Peak grad: 26.8 mmHg MR Vmax:      259.00 cm/s   SHUNTS MV E velocity: 120.00 cm/s  Systemic VTI:  0.22 m MV A velocity: 113.00 cm/s  Systemic Diam: 1.90 cm MV E/A ratio:  1.06 Buford Dresser MD Electronically signed by Buford Dresser MD Signature Date/Time: 01/17/2023/2:55:12 PM    Final      Medical Consultants:   None.   Subjective:    HIAWATHA SCHMALTZ continues  to complain of back pain.  Objective:    Vitals:   01/19/23 0954 01/19/23 0956 01/19/23 1026 01/19/23 1030  BP: (!) 134/94 (!) 128/93 90/78 90/78 $  Pulse: (!) 129 (!) 139 (!) 136 75  Resp: 14 18 13 15  $ Temp: 98.3 F (36.8 C)     TempSrc: Oral     SpO2: 100% 99% 100% 100%  Weight:      Height:       SpO2: 100 %   Intake/Output Summary (Last 24 hours) at 01/19/2023 1037 Last data filed at 01/18/2023 1919 Gross per 24 hour  Intake 160 ml  Output --  Net 160 ml    Filed Weights   01/16/23 0145 01/17/23 1454 01/17/23 1814  Weight: 52.8 kg 53.4 kg 53 kg    Exam: General exam: In no acute distress. Respiratory system: Good air movement and clear to auscultation. Cardiovascular system: S1 & S2 heard, RRR. No JVD. Gastrointestinal system: Abdomen is nondistended, soft and nontender.  Extremities: No pedal edema. Skin: No rashes, lesions or ulcers Psychiatry: Judgement and insight appear normal. Mood & affect appropriate. Data Reviewed:    Labs: Basic Metabolic Panel: Recent Labs  Lab 01/15/23 1218 01/16/23 0112 01/17/23 1524  NA 136 135 135  K 5.0 4.4 4.2  CL 92* 92* 93*  CO2 27 23 23  $ GLUCOSE 82 63* 126*  BUN 46* 54* 76*  CREATININE 8.76* 9.31* 10.98*  CALCIUM 9.4 9.2 9.1  PHOS  --   --  8.3*    GFR Estimated Creatinine Clearance: 3.8 mL/min (A) (by C-G formula based on SCr of 10.98 mg/dL (H)). Liver Function Tests: Recent Labs  Lab 01/15/23 1218 01/17/23 1524  AST 31  --   ALT 6  --   ALKPHOS 52  --   BILITOT 1.0  --   PROT 7.0  --   ALBUMIN 2.6* 2.1*    No results for input(s): "LIPASE", "AMYLASE" in the last 168 hours. No results for input(s): "AMMONIA" in the last 168 hours. Coagulation profile No results for input(s): "INR", "PROTIME" in the last 168 hours. COVID-19 Labs  No results for input(s): "DDIMER", "FERRITIN", "LDH", "CRP" in the last 72 hours.   Lab Results  Component Value Date   SARSCOV2NAA NEGATIVE  11/29/2022   SARSCOV2NAA  NEGATIVE 06/12/2022   SARSCOV2NAA NEGATIVE 01/16/2022   [place] NEGATIVE 12/28/2021    CBC: Recent Labs  Lab 01/15/23 1218 01/16/23 0112 01/17/23 1524 01/19/23 0949  WBC 8.2 8.2 8.8 8.2  NEUTROABS 6.8  --   --   --   HGB 9.3* 9.6* 9.6* 9.7*  HCT 28.7* 29.0* 27.9* 28.9*  MCV 106.3* 102.8* 99.6 99.0  PLT 246 214 329 401*    Cardiac Enzymes: No results for input(s): "CKTOTAL", "CKMB", "CKMBINDEX", "TROPONINI" in the last 168 hours. BNP (last 3 results) No results for input(s): "PROBNP" in the last 8760 hours. CBG: No results for input(s): "GLUCAP" in the last 168 hours. D-Dimer: No results for input(s): "DDIMER" in the last 72 hours. Hgb A1c: No results for input(s): "HGBA1C" in the last 72 hours. Lipid Profile: No results for input(s): "CHOL", "HDL", "LDLCALC", "TRIG", "CHOLHDL", "LDLDIRECT" in the last 72 hours. Thyroid function studies: No results for input(s): "TSH", "T4TOTAL", "T3FREE", "THYROIDAB" in the last 72 hours.  Invalid input(s): "FREET3" Anemia work up: No results for input(s): "VITAMINB12", "FOLATE", "FERRITIN", "TIBC", "IRON", "RETICCTPCT" in the last 72 hours. Sepsis Labs: Recent Labs  Lab 01/15/23 1218 01/15/23 1219 01/16/23 0112 01/17/23 1524 01/19/23 0949  WBC 8.2  --  8.2 8.8 8.2  LATICACIDVEN  --  1.9  --   --   --     Microbiology Recent Results (from the past 240 hour(s))  Blood culture (routine x 2)     Status: Abnormal   Collection Time: 01/15/23  2:25 PM   Specimen: BLOOD  Result Value Ref Range Status   Specimen Description BLOOD SITE NOT SPECIFIED  Final   Special Requests   Final    BOTTLES DRAWN AEROBIC AND ANAEROBIC Blood Culture adequate volume   Culture  Setup Time   Final    ANAEROBIC BOTTLE ONLY GRAM POSITIVE COCCI IN CLUSTERS Organism ID to follow CRITICAL RESULT CALLED TO, READ BACK BY AND VERIFIED WITH:  C/ PHARMD C. PIERCE 01/16/23 1620 A. LAFRANCE Performed at [facility], [place] [address],  [place], [place] [address]    Culture METHICILLIN RESISTANT STAPHYLOCOCCUS AUREUS (A)  Final   Report Status 01/18/2023 FINAL  Final   Organism ID, Bacteria METHICILLIN RESISTANT STAPHYLOCOCCUS AUREUS  Final      Susceptibility   Methicillin resistant staphylococcus aureus - MIC*    CIPROFLOXACIN <=0.5 SENSITIVE Sensitive     ERYTHROMYCIN <=0.25 SENSITIVE Sensitive     GENTAMICIN <=0.5 SENSITIVE Sensitive     OXACILLIN >=4 RESISTANT Resistant     TETRACYCLINE >=16 RESISTANT Resistant     VANCOMYCIN 1 SENSITIVE Sensitive     TRIMETH/SULFA <=10 SENSITIVE Sensitive     CLINDAMYCIN <=0.25 SENSITIVE Sensitive     RIFAMPIN <=0.5 SENSITIVE Sensitive     Inducible Clindamycin NEGATIVE Sensitive     * METHICILLIN RESISTANT STAPHYLOCOCCUS AUREUS  Blood Culture ID Panel (Reflexed)     Status: Abnormal   Collection Time: 01/15/23  2:25 PM  Result Value Ref Range Status   Enterococcus faecalis NOT DETECTED NOT DETECTED Final   Enterococcus Faecium NOT DETECTED NOT DETECTED Final   Listeria monocytogenes NOT DETECTED NOT DETECTED Final   Staphylococcus species DETECTED (A) NOT DETECTED Final    Comment: CRITICAL RESULT CALLED TO, READ BACK BY AND VERIFIED WITH:  C/ PHARMD C. PIERCE 01/16/23 1620 A. LAFRANCE    Staphylococcus aureus (BCID) DETECTED (A) NOT DETECTED Final    Comment: Methicillin (oxacillin)-resistant Staphylococcus aureus (MRSA).  MRSA is predictably resistant to beta-lactam antibiotics (except ceftaroline). Preferred therapy is vancomycin unless clinically contraindicated. Patient requires contact precautions if  hospitalized. CRITICAL RESULT CALLED TO, READ BACK BY AND VERIFIED WITH:  C/ PHARMD C. PIERCE 01/16/23 1620 A. LAFRANCE    Staphylococcus epidermidis NOT DETECTED NOT DETECTED Final   Staphylococcus lugdunensis NOT DETECTED NOT DETECTED Final   Streptococcus species NOT DETECTED NOT DETECTED Final   Streptococcus agalactiae NOT DETECTED NOT DETECTED Final   Streptococcus  pneumoniae NOT DETECTED NOT DETECTED Final   Streptococcus pyogenes NOT DETECTED NOT DETECTED Final   A.calcoaceticus-baumannii NOT DETECTED NOT DETECTED Final   Bacteroides fragilis NOT DETECTED NOT DETECTED Final   Enterobacterales NOT DETECTED NOT DETECTED Final   Enterobacter cloacae complex NOT DETECTED NOT DETECTED Final   Escherichia coli NOT DETECTED NOT DETECTED Final   Klebsiella aerogenes NOT DETECTED NOT DETECTED Final   Klebsiella oxytoca NOT DETECTED NOT DETECTED Final   Klebsiella pneumoniae NOT DETECTED NOT DETECTED Final   Proteus species NOT DETECTED NOT DETECTED Final   Salmonella species NOT DETECTED NOT DETECTED Final   Serratia marcescens NOT DETECTED NOT DETECTED Final   Haemophilus influenzae NOT DETECTED NOT DETECTED Final   Neisseria meningitidis NOT DETECTED NOT DETECTED Final   Pseudomonas aeruginosa NOT DETECTED NOT DETECTED Final   Stenotrophomonas maltophilia NOT DETECTED NOT DETECTED Final   Candida albicans NOT DETECTED NOT DETECTED Final   Candida auris NOT DETECTED NOT DETECTED Final   Candida glabrata NOT DETECTED NOT DETECTED Final   Candida krusei NOT DETECTED NOT DETECTED Final   Candida parapsilosis NOT DETECTED NOT DETECTED Final   Candida tropicalis NOT DETECTED NOT DETECTED Final   Cryptococcus neoformans/gattii NOT DETECTED NOT DETECTED Final   Meth resistant mecA/C and MREJ DETECTED (A) NOT DETECTED Final    Comment: CRITICAL RESULT CALLED TO, READ BACK BY AND VERIFIED WITH:  C/ PHARMD C. PIERCE 01/16/23 1620 A. LAFRANCE Performed at [facility], [place] [address], [place], [place] [address]   Culture, blood (Routine X 2) w Reflex to ID Panel     Status: None (Preliminary result)   Collection Time: 01/16/23  5:59 PM   Specimen: BLOOD RIGHT ARM  Result Value Ref Range Status   Specimen Description BLOOD RIGHT ARM  Final   Special Requests   Final    BOTTLES DRAWN AEROBIC AND ANAEROBIC Blood Culture results may not be optimal due to an  inadequate volume of blood received in culture bottles   Culture   Final    NO GROWTH 3 DAYS Performed at [facility], [place] [address], [place], [place] [address]    Report Status PENDING  Incomplete  Culture, blood (Routine X 2) w Reflex to ID Panel     Status: None (Preliminary result)   Collection Time: 01/16/23  5:59 PM   Specimen: BLOOD RIGHT ARM  Result Value Ref Range Status   Specimen Description BLOOD RIGHT ARM  Final   Special Requests   Final    BOTTLES DRAWN AEROBIC AND ANAEROBIC Blood Culture results may not be optimal due to an inadequate volume of blood received in culture bottles   Culture   Final    NO GROWTH 3 DAYS Performed at [facility], [place] [address] [place], [place] [address]    Report Status PENDING  Incomplete     Medications:    apixaban  2.5 mg Oral BID   Chlorhexidine Gluconate Cloth  6 each Topical Q0600  hydrALAZINE  50 mg Oral 3 times per day on Mon Wed Fri   And   hydrALAZINE  50 mg Oral 2 times per day on Sun Tue Thu Sat   metoprolol succinate  50 mg Oral Daily   pantoprazole  40 mg Oral Daily   sevelamer carbonate  1,600 mg Oral TID WC   Continuous Infusions:  vancomycin Stopped (01/17/23 1804)      LOS: 3 days   Charlynne Cousins  [facility]  01/19/2023, 10:37 AM

## 2023-01-19 NOTE — Significant Event (Signed)
Rapid Response Event Note   Reason for Call :  Afib RVR  Amiodarone gtt and bolus ordered as well as tx to PCU bed.  RRT asked to assess if gtt needs to be started prior to transferring to PCU.  Initial Focused Assessment:  Pt lying in bed with eyes closed, in no visible distress. She awakens easily and is alert and oriented x 2(person and situation). She denies pain and dizziness. Lungs clear/diminished. Skin warm and dry.  T-98.5(O), HR-141, BP-155/101, RR-18, SpO2-100% on RA.  Interventions:  Amiodarone gtt with bolus-to be started on arrival to PCU bed. Tx to PCU Plan of Care:  Pt is tachycardic but currently stable. Transfer pt to PCU. Start amiodarone gtt.  Continue to monitor pt closely. Call RRT if further assistance needed.   Event Summary:   MD Notified: Dr. Olevia Bowens notified PTA RRT Call Time:1833 Arrival [id number] [place]  Dillard Essex, RN

## 2023-01-19 NOTE — Procedures (Signed)
Patient seen on Hemodialysis. BP (!) 169/111 (BP Location: Right Arm)   Pulse 92   Temp 98.6 F (37 C) (Oral)   Resp 16   Ht 5' 6"$  (1.676 m)   Wt 53 kg   SpO2 100%   BMI 18.86 kg/m   QB 300, UF goal 1L Tolerating treatment with complaints of some leg pain and mild chest pain at this time. Mildly tachycardic and normotensive; will give SL NTG and morphine.    Elmarie Shiley MD [facility]. Office # [phone] Pager # [phone] 10:02 AM

## 2023-01-19 NOTE — Consult Note (Addendum)
Cardiology Consultation   Patient ID: BERTINE TERSIGNI MRN: [id number]; DOB: 02/18/1951  Admit date: 01/15/2023 Date of Consult: 01/19/2023  PCP:  [facility] Cardiologist:  Freada Bergeron, MD        Patient Profile:   NEOSHA SHIMEL is a 72 y.o. female with a hx of ESRD on HD, MRSA bacteremia with IE with recurrent episodes of bacteremia, paroxysmal atrial fib/flutter, HTN, anemia of chronic disease, asthma, hypothyroidism, gout, chronic hepatitis C, lung nodule (stable by f/u CT 06/2022 for 12-18 month f/u), gallbladder mass benign s/p cholecystectomy 06/2022, RBBB who is being seen 01/19/2023 for the evaluation of AF RVR at the request of Dr. Aileen Fass.  History of Present Illness:   Ms. Lundsten was initially seen by our team in 12/2021 during admission for severe sepsis with MRSA bacteremia, L5-S1 discitis and right atrial/IVC mass. She underwent TEE which showed 2.1x1.4cm multilobulated mobile mass attached to the RA/IVC junction 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 vs vegtation at prior HD catheter site. She was managed with antibiotics and apixaban. During admission she developed atrial flutter. Apixaban had to be temporarily held due to bleeding at her IJ site with hypotension requiring transfusion. ID helped to coordinate her abx, reported to have received 8 weeks of IV vancomycin. She was able to be discharged on Eliquis. At office f/u 01/2022 she was back in NSR. She has had multiple readmissions since that time for bacteremia +/- discitis with multiple medical issues and encephalopathy requiring requiring additional abx and eventual Bactrim suppression, frequently complicated by Afib RVR. Subsequent TEE 06/2022 and echoes have not demonstrated any recurrent endocarditis. During 09/2022 admission she was trialed on IV amiodarone for her AFib with conversion to NSR but developed QT prolongation to 577m so this was discontinued. However, on  hand calculation of EKG 10/20, QTc corrects to 4014m  She was readmitted this admission with worsening back pain for the past week. MRI lumber 01/15/23 was concerning for osteomyelitis discitis at L2-3 and L4-5 with slight interval worsening in paravertebral inflammatory change, superimposed 2.3 cm heterogeneous area of involving the left psoas muscle at the level of L3 likely reflects phlegmon, underlying spondylosis and facet arthrosis. CXR NAD. Blood culture on 2/10 grew MRSA.She also has had acute metabolic encephalopathy and metabolic acidosis. Cardiology is consulted for episodes of AF RVR. Per telemetry review it appears she went back into afib the afternoon of 2/12. 2D echo 01/17/23 EF 60-65%, G2DD, mildly elevated PASP, no valvular vegetations. ID has seen her and recommends prolonged IV abx followed by PO suppression indefinitely. TEE is planned for tomorrow. However, she is not presently consentable. She is a poor historian and cannot tell me where she is or what day it is. She knows her first name only. She tends to stare off into space after being asked a question, but is easily responsive when you touch her arm and call her name again. She denies any symptoms. Denies CP, SOB. She was hypertensive yesterday but today has had pressures down to 90/78.  Meds for afib: - apixaban 2.5 mg BID - did not receive dose this AM as away for HD - Toprol - received 10055mesterday, did not get 46m63mse this AM as away for HD - diltiazem 30mg37mr - written to start this AM but did not receive yet as away for HD - PRN IV metoprolol - receiving approximately BID the last 2 days   Past Medical History:  Diagnosis Date   Anemia    Asthma    Atrial flutter with rapid ventricular response ([place]) 12/25/2021   Bacteremia    ESRD (end stage renal disease) ([place])    dialysis Tues Thurs Sat   Gout    Hepatitis C    Hypertension    PAF (paroxysmal atrial fibrillation) ([place])    RBBB     Past Surgical  History:  Procedure Laterality Date   AV FISTULA PLACEMENT Left 12/23/2021   Procedure: LEFT ARM BRACHIOBASILIC VEIN ARTERIOVENOUS (AV) FISTULA CREATION;  Surgeon: [name], MD;  Location: [facility];  Service: Vascular;  Laterality: Left;   [place] Left 03/08/2022   Procedure: LEFT SECOND STAGE BASILIC VEIN TRANSPOSITION;  Surgeon: [name], MD;  Location: [facility] OR;  Service: Vascular;  Laterality: Left;   BIOPSY  01/19/2022   Procedure: BIOPSY;  Surgeon: Lavena Bullion, DO;  Location: [facility] ENDOSCOPY;  Service: Gastroenterology;;   [place] N/A 06/25/2022   Procedure: OPEN CHOLECYSTECTOMY;  Surgeon: Dwan Bolt, MD;  Location: [facility];  Service: General;  Laterality: N/A;   COLONOSCOPY Left 01/19/2022   Procedure: COLONOSCOPY;  Surgeon: Lavena Bullion, DO;  Location: [facility];  Service: Gastroenterology;  Laterality: Left;   ESOPHAGOGASTRODUODENOSCOPY Left 01/19/2022   Procedure: ESOPHAGOGASTRODUODENOSCOPY (EGD);  Surgeon: Lavena Bullion, DO;  Location: [facility] ENDOSCOPY;  Service: Gastroenterology;  Laterality: Left;   INSERTION OF DIALYSIS CATHETER Right 12/23/2021   Procedure: INSERTION OF DIALYSIS CATHETER;  Surgeon: [name], MD;  Location: [facility];  Service: Vascular;  Laterality: Right;   LAPAROSCOPY N/A 06/25/2022   Procedure: STAGING LAPAROSCOPY;  Surgeon: Dwan Bolt, MD;  Location: [place];  Service: General;  Laterality: N/A;   OPEN PARTIAL HEPATECTOMY  N/A 06/25/2022   Procedure: OPEN PARTIAL HEPATECTOMY;  Surgeon: Dwan Bolt, MD;  Location: [facility];  Service: General;  Laterality: N/A;   POLYPECTOMY  01/19/2022   Procedure: POLYPECTOMY;  Surgeon: Lavena Bullion, DO;  Location: [place] ENDOSCOPY;  Service: Gastroenterology;;   TEE WITHOUT CARDIOVERSION N/A 12/22/2021   Procedure: TRANSESOPHAGEAL ECHOCARDIOGRAM (TEE);  Surgeon: Jerline Pain, MD;  Location: [facility] ENDOSCOPY;  Service: Cardiovascular;  Laterality:  N/A;   TEE WITHOUT CARDIOVERSION N/A 06/17/2022   Procedure: TRANSESOPHAGEAL ECHOCARDIOGRAM (TEE);  Surgeon: Buford Dresser, MD;  Location: [facility] ENDOSCOPY;  Service: Cardiovascular;  Laterality: N/A;   ULTRASOUND GUIDANCE FOR VASCULAR ACCESS  12/23/2021   Procedure: ULTRASOUND GUIDANCE FOR VASCULAR ACCESS;  Surgeon: [name], MD;  Location: [place];  Service: Vascular;;     Home Medications:  Prior to Admission medications   Medication Sig Start Date End Date Taking? Authorizing Provider  acetaminophen (TYLENOL) 325 MG tablet Take 2 tablets (650 mg total) by mouth 4 (four) times daily. Patient taking differently: Take 650 mg by mouth every 12 (twelve) hours as needed for moderate pain. 06/30/22  Yes Elgergawy, Silver Huguenin, MD  albuterol (VENTOLIN HFA) 108 (90 Base) MCG/ACT inhaler Inhale 2 puffs into the lungs every 4 (four) hours as needed for wheezing or shortness of breath. 10/06/22  Yes Ripley Fraise, MD  apixaban (ELIQUIS) 2.5 MG TABS tablet Take 1 tablet (2.5 mg total) by mouth 2 (two) times daily. 10/06/22 12/01/23 Yes Ripley Fraise, MD  fluticasone-salmeterol (ADVAIR DISKUS) 250-50 MCG/ACT AEPB Inhale 1 puff into the lungs in the morning and at bedtime. 10/06/22  Yes Ripley Fraise, MD  gabapentin (NEURONTIN) 100 MG capsule Take 1 capsule (100  mg total) by mouth at bedtime. Patient taking differently: Take 100 mg by mouth See admin instructions. Twice a day on regular days, Tuesday, Thursday, Saturday and Sunday. Three times a day on dialysis days, Monday, Wednesday, and Friday 10/15/22  Yes Vu, Trung T, MD  hydrALAZINE (APRESOLINE) 100 MG tablet Take 1 tablet by mouth 3 times a day every Monday Wednesday Friday and Sunday Take 1 tablet by mouth 2 times a day every Tuesday Thursday and Saturday Patient taking differently: Take 50 mg by mouth See admin instructions. 50 mg three times daily on dialysis days .Monday, Wednesday, Fridays 50 mg twice daily on regular days Sunday,  Tuesday, Thursday, Saturday 10/06/22  Yes Ripley Fraise, MD  Methoxy PEG-Epoetin Beta (MIRCERA IJ) Mircera 10/09/22 10/08/23 Yes [provider]  metoprolol succinate (TOPROL XL) 25 MG 24 hr tablet Take 2 tablets (50 mg total) by mouth daily. Patient taking differently: Take 50 mg by mouth 2 (two) times daily. 12/03/22  Yes Linward Natal, MD  pantoprazole (PROTONIX) 40 MG tablet Take 1 tablet (40 mg total) by mouth daily. Patient taking differently: Take 40 mg by mouth 2 (two) times daily. 10/06/22  Yes Ripley Fraise, MD  sevelamer (RENAGEL) 800 MG tablet Take 2 tablets by mouth 3 times a day every Tuesday Thursday and Saturday Give 2 tablets by mouth with meals every Monday Wednesday Friday and Sunday Patient taking differently: Take 800 mg by mouth 3 (three) times daily with meals. 10/06/22  Yes Ripley Fraise, MD  sevelamer carbonate (RENVELA) 0.8 g PACK packet Take 0.8 g by mouth with breakfast, with lunch, and with evening meal.   Yes [provider]  cloNIDine (CATAPRES) 0.1 MG tablet Take 0.1 mg by mouth every 6 (six) hours as needed (SBP > 170). Patient not taking: Reported on 01/16/2023    [provider]    Inpatient Medications: Scheduled Meds:  amiodarone  150 mg Intravenous Once   apixaban  2.5 mg Oral BID   Chlorhexidine Gluconate Cloth  6 each Topical Q0600   metoprolol tartrate  25 mg Oral BID   pantoprazole  40 mg Oral Daily   sevelamer carbonate  1,600 mg Oral TID WC   Continuous Infusions:  amiodarone     Followed by   Derrill Memo ON 01/20/2023] amiodarone     vancomycin Stopped (01/19/23 1204)   PRN Meds: cloNIDine, HYDROcodone-acetaminophen, morphine injection, nitroGLYCERIN  Allergies:    Allergies  Allergen Reactions   Shrimp (Diagnostic) Other (See Comments)    Unknown reaction   Other Swelling and Rash    Anesthesia - facial swelling, rash    Social History:   Social History   Socioeconomic History   Marital status:  Widowed    Spouse name: Not on file   Number of children: Not on file   Years of education: Not on file   Highest education level: Not on file  Occupational History   Not on file  Tobacco Use   Smoking status: Never    Passive exposure: Never   Smokeless tobacco: Never  Vaping Use   Vaping Use: Never used  Substance and Sexual Activity   Alcohol use: No   Drug use: No   Sexual activity: Not Currently    Birth control/protection: Post-menopausal  Other Topics Concern   Not on file  Social History Narrative   Not on file   Social Determinants of Health   Financial Resource Strain: Not on file  Food Insecurity: No Food Insecurity (11/30/2022)  Hunger Vital Sign    Worried About Running Out of Food in the Last Year: Never true    Ran Out of Food in the Last Year: Never true  Transportation Needs: No Transportation Needs (11/30/2022)   PRAPARE - Hydrologist (Medical): No    Lack of Transportation (Non-Medical): No  Physical Activity: Not on file  Stress: Not on file  Social Connections: Moderately Integrated (07/20/2022)   Social Connection and Isolation Panel [NHANES]    Frequency of Communication with Friends and Family: Three times a week    Frequency of Social Gatherings with Friends and Family: Three times a week    Attends Religious Services: 1 to 4 times per year    Active Member of Clubs or Organizations: Yes    Attends Archivist Meetings: 1 to 4 times per year    Marital Status: Widowed  Intimate Partner Violence: Not At Risk (11/30/2022)   Humiliation, Afraid, Rape, and Kick questionnaire    Fear of Current or Ex-Partner: No    Emotionally Abused: No    Physically Abused: No    Sexually Abused: No    Family History:   Family History  Problem Relation Age of Onset   Hypertension Mother    Hypertension Father    Colon cancer Brother    Lung cancer Brother      ROS:  Please see the history of present illness.  All  other ROS reviewed and negative.     Physical Exam/Data:   Vitals:   01/19/23 1300 01/19/23 1330 01/19/23 1355 01/19/23 1529  BP: 98/80 (!) 110/92 108/75 105/70  Pulse: (!) 141  (!) 141 (!) 132  Resp: 20  20   Temp:    98.9 F (37.2 C)  TempSrc:    Oral  SpO2: 98% 100% 100% 99%  Weight:      Height:        Intake/Output Summary (Last 24 hours) at 01/19/2023 1742 Last data filed at 01/19/2023 1355 Gross per 24 hour  Intake 60 ml  Output 200 ml  Net -140 ml      01/17/2023    6:14 PM 01/17/2023    2:54 PM 01/16/2023    1:45 AM  Last 3 Weights  Weight (lbs) 116 lb 13.5 oz 117 lb 11.6 oz 116 lb 6.5 oz  Weight (kg) 53 kg 53.4 kg 52.8 kg     Body mass index is 18.86 kg/m.  General: Frail thin appearing AAF in NAD Head: Normocephalic, atraumatic, sclera non-icteric, no xanthomas, nares are without discharge. Neck: Negative for carotid bruits. JVP not elevated. Lungs: Poor inspiratory effort but otherwise clear bilaterally to auscultation without wheezes, rales, or rhonchi. Breathing is unlabored. Heart: Irregularly irregular, S1 S2 without murmurs, rubs, or gallops.  Abdomen: Soft, non-tender, non-distended with normoactive bowel sounds. No rebound/guarding. Extremities: No clubbing or cyanosis. No edema. Distal pedal pulses are 2+ and equal bilaterally. Neuro: Oriented to self only, does not respond when asking date or location, answers in short 1-2 words only Psych: Calm affect   EKG:  The EKG was personally reviewed and demonstrates: 01/15/23  NSR 76bpm RBBB QTC 477m Telemetry:  Telemetry was personally reviewed and demonstrates:  afib  Relevant CV Studies: 2d echo 01/17/23   1. Left ventricular ejection fraction, by estimation, is 60 to 65%. The  left ventricle has normal function. The left ventricle has no regional  wall motion abnormalities. Left ventricular diastolic parameters are  consistent with  Grade II diastolic  dysfunction (pseudonormalization).   2. Right  ventricular systolic function is normal. The right ventricular  size is normal. There is mildly elevated pulmonary artery systolic  pressure.   3. The mitral valve is abnormal. Trivial mitral valve regurgitation. No  evidence of mitral stenosis. Moderate mitral annular calcification.   4. The aortic valve is tricuspid. There is moderate calcification of the  aortic valve. There is mild thickening of the aortic valve. Aortic valve  regurgitation is trivial. Aortic valve sclerosis/calcification is present,  without any evidence of aortic  stenosis.   5. The inferior vena cava is normal in size with greater than 50%  respiratory variability, suggesting right atrial pressure of 3 mmHg.   Comparison(s): No significant change from prior study.   Conclusion(s)/Recommendation(s): No evidence of valvular vegetations on  this transthoracic echocardiogram. Consider a transesophageal  echocardiogram to exclude infective endocarditis if clinically indicated.   Laboratory Data:  High Sensitivity Troponin:  No results for input(s): "TROPONINIHS" in the last 720 hours.   Chemistry Recent Labs  Lab 01/16/23 0112 01/17/23 1524 01/19/23 0949  NA 135 135 131*  K 4.4 4.2 4.2  CL 92* 93* 88*  CO2 23 23 24  $ GLUCOSE 63* 126* 83  BUN 54* 76* 54*  CREATININE 9.31* 10.98* 7.49*  CALCIUM 9.2 9.1 9.8  GFRNONAA 4* 3* 5*  ANIONGAP 20* 19* 19*    Recent Labs  Lab 01/15/23 1218 01/17/23 1524 01/19/23 0949  PROT 7.0  --   --   ALBUMIN 2.6* 2.1* 2.2*  AST 31  --   --   ALT 6  --   --   ALKPHOS 52  --   --   BILITOT 1.0  --   --    Lipids No results for input(s): "CHOL", "TRIG", "HDL", "LABVLDL", "LDLCALC", "CHOLHDL" in the last 168 hours.  Hematology Recent Labs  Lab 01/16/23 0112 01/17/23 1524 01/19/23 0949  WBC 8.2 8.8 8.2  RBC 2.82* 2.80* 2.92*  HGB 9.6* 9.6* 9.7*  HCT 29.0* 27.9* 28.9*  MCV 102.8* 99.6 99.0  MCH 34.0 34.3* 33.2  MCHC 33.1 34.4 33.6  RDW 19.8* 20.6* 20.2*  PLT 214  329 401*   Thyroid No results for input(s): "TSH", "FREET4" in the last 168 hours.  BNPNo results for input(s): "BNP", "PROBNP" in the last 168 hours.  DDimer No results for input(s): "DDIMER" in the last 168 hours.   Radiology/Studies:  ECHOCARDIOGRAM COMPLETE  Result Date: 01/17/2023    ECHOCARDIOGRAM REPORT   Patient Name:   KAIDENCE MALAN Date of Exam: 01/17/2023 Medical Rec #:  [id number]      Height:       66.0 in Accession #:    [id number]     Weight:       116.4 lb Date of Birth:  Mar 06, 1951     BSA:          1.589 m Patient Age:    34 years       BP:           181/97 mmHg Patient Gender: F              HR:           80 bpm. Exam Location:  Inpatient Procedure: 2D Echo Indications:    bacteremia  History:        Patient has prior history of Echocardiogram examinations, most  recent 09/22/2022.  Sonographer:    Harvie Junior Referring Phys:  [place]  1. Left ventricular ejection fraction, by estimation, is 60 to 65%. The left ventricle has normal function. The left ventricle has no regional wall motion abnormalities. Left ventricular diastolic parameters are consistent with Grade II diastolic dysfunction (pseudonormalization).  2. Right ventricular systolic function is normal. The right ventricular size is normal. There is mildly elevated pulmonary artery systolic pressure.  3. The mitral valve is abnormal. Trivial mitral valve regurgitation. No evidence of mitral stenosis. Moderate mitral annular calcification.  4. The aortic valve is tricuspid. There is moderate calcification of the aortic valve. There is mild thickening of the aortic valve. Aortic valve regurgitation is trivial. Aortic valve sclerosis/calcification is present, without any evidence of aortic stenosis.  5. The inferior vena cava is normal in size with greater than 50% respiratory variability, suggesting right atrial pressure of 3 mmHg. Comparison(s): No significant change from prior study.  Conclusion(s)/Recommendation(s): No evidence of valvular vegetations on this transthoracic echocardiogram. Consider a transesophageal echocardiogram to exclude infective endocarditis if clinically indicated. FINDINGS  Left Ventricle: Left ventricular ejection fraction, by estimation, is 60 to 65%. The left ventricle has normal function. The left ventricle has no regional wall motion abnormalities. The left ventricular internal cavity size was normal in size. There is  borderline left ventricular hypertrophy. Left ventricular diastolic parameters are consistent with Grade II diastolic dysfunction (pseudonormalization). Right Ventricle: The right ventricular size is normal. No increase in right ventricular wall thickness. Right ventricular systolic function is normal. There is mildly elevated pulmonary artery systolic pressure. The tricuspid regurgitant velocity is 3.09  m/s, and with an assumed right atrial pressure of 3 mmHg, the estimated right ventricular systolic pressure is [id number] mmHg. Left Atrium: Left atrial size was normal in size. Right Atrium: Right atrial size was normal in size. Pericardium: There is no evidence of pericardial effusion. Mitral Valve: The mitral valve is abnormal. There is mild thickening of the mitral valve leaflet(s). There is mild calcification of the mitral valve leaflet(s). Moderate mitral annular calcification. Trivial mitral valve regurgitation. No evidence of mitral valve stenosis. Tricuspid Valve: The tricuspid valve is normal in structure. Tricuspid valve regurgitation is mild . No evidence of tricuspid stenosis. Aortic Valve: The aortic valve is tricuspid. There is moderate calcification of the aortic valve. There is mild thickening of the aortic valve. Aortic valve regurgitation is trivial. Aortic valve sclerosis/calcification is present, without any evidence of aortic stenosis. Aortic valve mean gradient measures 5.0 mmHg. Aortic valve peak gradient measures 8.9 mmHg. Aortic  valve area, by VTI measures 2.10 cm. Pulmonic Valve: The pulmonic valve was grossly normal. Pulmonic valve regurgitation is not visualized. No evidence of pulmonic stenosis. Aorta: The aortic root, ascending aorta, aortic arch and descending aorta are all structurally normal, with no evidence of dilitation or obstruction. Venous: The inferior vena cava is normal in size with greater than 50% respiratory variability, suggesting right atrial pressure of 3 mmHg. IAS/Shunts: The atrial septum is grossly normal.  LEFT VENTRICLE PLAX 2D LVIDd:         5.05 cm      Diastology LVIDs:         3.95 cm      LV e' medial:    7.15 cm/s LV PW:         1.00 cm      LV E/e' medial:  16.8 LV IVS:        1.05 cm  LV e' lateral:   8.70 cm/s LVOT diam:     1.90 cm      LV E/e' lateral: 13.8 LV SV:         62 LV SV Index:   39 LVOT Area:     2.84 cm                              3D Volume EF: LV Volumes (MOD)            3D EF:        59 % LV vol d, MOD A2C: 141.0 ml LV EDV:       114 ml LV vol d, MOD A4C: 134.0 ml LV ESV:       47 ml LV vol s, MOD A2C: 52.2 ml  LV SV:        67 ml LV vol s, MOD A4C: 48.5 ml LV SV MOD A2C:     88.8 ml LV SV MOD A4C:     134.0 ml LV SV MOD BP:      93.5 ml RIGHT VENTRICLE RV Basal diam:  3.50 cm RV Mid diam:    2.40 cm RV S prime:     18.80 cm/s TAPSE (M-mode): 3.0 cm LEFT ATRIUM           Index        RIGHT ATRIUM           Index LA diam:      3.50 cm 2.20 cm/m   RA Area:     12.90 cm LA Vol (A4C): 47.7 ml 30.02 ml/m  RA Volume:   29.60 ml  18.63 ml/m  AORTIC VALVE                    PULMONIC VALVE AV Area (Vmax):    2.25 cm     PV Vmax:       1.28 m/s AV Area (Vmean):   2.15 cm     PV Peak grad:  6.6 mmHg AV Area (VTI):     2.10 cm AV Vmax:           149.00 cm/s AV Vmean:          99.500 cm/s AV VTI:            0.297 m AV Peak Grad:      8.9 mmHg AV Mean Grad:      5.0 mmHg LVOT Vmax:         118.00 cm/s LVOT Vmean:        75.600 cm/s LVOT VTI:          0.220 m LVOT/AV VTI ratio: 0.74  AORTA  Ao Root diam: 3.10 cm Ao Asc diam:  3.50 cm MITRAL VALVE                TRICUSPID VALVE MV Area (PHT): 4.31 cm     TR Peak grad:   38.2 mmHg MV Decel Time: 176 msec     TR Vmax:        309.00 cm/s MR Peak grad: 26.8 mmHg MR Vmax:      259.00 cm/s   SHUNTS MV E velocity: 120.00 cm/s  Systemic VTI:  0.22 m MV A velocity: 113.00 cm/s  Systemic Diam: 1.90 cm MV E/A ratio:  1.06 Buford Dresser MD Electronically signed by Buford Dresser MD Signature Date/Time: 01/17/2023/2:55:12 PM    Final  MR LUMBAR SPINE WO CONTRAST  Result Date: 01/15/2023 CLINICAL DATA:  Initial evaluation for worsened lower back pain. EXAM: MRI LUMBAR SPINE WITHOUT CONTRAST TECHNIQUE: Multiplanar, multisequence MR imaging of the lumbar spine was performed. No intravenous contrast was administered. COMPARISON:  Previous MRI from 11/30/2022. FINDINGS: Segmentation: Transitional lumbosacral anatomy with sacralization of L5. Same numbering system employed as on previous exam. Alignment: Stable trace anterolisthesis of L1 on L2 and L3 on L4. Underlying mild scoliosis. Vertebrae: Persistent endplate irregularity with decreased T1 signal intensity, and marrow edema about the L2-3 and L4-5 interspaces. Similar intra discal fluid signal intensity at L2-3 and L3-4. Underlying chronic L2 and L3 compression deformities, stable. No acute or interval fracture. Bone marrow signal intensity heterogeneous without worrisome osseous lesion. Conus medullaris and cauda equina: Conus extends to the L1 level. Conus and cauda equina appear normal. Soft tissue material within the ventral epidural space at L3 suspicious for phlegmon, similar to prior. Paraspinal and other soft tissues: Paravertebral edema and inflammatory changes seen involving the left greater than right psoas musculature. Overall changes are slightly worsened from prior. Superimposed heterogeneous T2 hypointense area measuring 2.3 cm within the left psoas muscle is increased from prior,  likely phlegmon. No drainable soft tissue fluid collections. Renal atrophy with multiple bilateral simple cysts, consistent with history of end-stage renal disease. No follow-up imaging recommended. Atherosclerotic change noted within the aorta. Disc levels: L1-2: Diffuse disc bulge with endplate spurring. Mild facet hypertrophy. Unchanged mild narrowing of the lateral recesses. Central canal remains patent. Mild left L1 foraminal narrowing. Right neural foramina remains patent. L2-3: Mild disc bulge. Mild to moderate facet hypertrophy with ligament flavum thickening. Resultant mild-to-moderate canal with bilateral subarticular stenosis. Mild bilateral L2 foraminal narrowing. Appearance is relatively stable. L3-4: Diffuse disc bulge. Moderate facet and ligament flavum hypertrophy. Superimposed epidural phlegmon within the ventral epidural space at L3. Resultant severe spinal stenosis. Mild to moderate left with mild right L3 foraminal narrowing. Appearance is stable. L4-5: Severe intervertebral disc space narrowing with diffuse disc osteophyte complex. Superimposed small central disc protrusion with caudad angulation. Right worse than left facet hypertrophy. Moderate spinal stenosis with moderate to severe bilateral lateral recess narrowing, stable. Mild-to-moderate left L4 foraminal narrowing. Right neural foramen remains patent. L5-S1: Transitional segment. No disc bulge or focal disc herniation. No stenosis. IMPRESSION: 1. Persistent findings concerning for osteomyelitis discitis at L2-3 and L4-5 with slight interval worsening in paravertebral inflammatory changes as compared to previous MRI from 11/30/2022. Superimposed 2.3 cm heterogeneous area of involving the left psoas muscle at the level of L3 likely reflects phlegmon. No drainable fluid collections identified. 2. Underlying moderate multilevel spondylosis and facet arthrosis with moderate to severe canal and lateral recess narrowing at L2-3 through L5-S1  as above. Appearance is not significantly changed as compared to prior. Electronically Signed   By: Jeannine Boga M.D.   On: 01/15/2023 21:59     Assessment and Plan:   1. Recurrent bacteremia, ongoing issue for patient across multiple admissions - per IM/ID - plan is for TEE for tomorrow - patient is not currently consentable so I talked to daughter [name]. Daughter states pt had bad reaction to anesthesia in 03/2022 (looks like fistula placement at that time) and so they are hesitant to pursue. She said she would have to talk to her brother get the final word. I talked to Dr. Audie Box who recommends to cancel the procedure for now, that she might need an anesthesia consult to sort this out as we cannot  see what anesthesia she'd gotten in 03/2022. Patient required ER visit with lip swelling so understand their concerns - msg sent to [employer] inbox to cancel when endoscopy opens in AM. May need to weigh risk/benefit further with ID as to whether proceeding would impact management, no severe valvular regurg seen on surface echo - cardiology will continue to follow  2. Paroxysmal atrial fib/flutter - known history of such, frequently occurring in the setting of infection - onset 2/12 per telemetry - did not receive Eliquis, diltiazem, or metoprolol this AM as the patient was not available and away at HD, did receive IV metoprolol twice today - soft BP has been problematic today therefore we will d/c hydralazine for now, start IV amiodarone with attention to QT interval on follow-up, and add scheduled Lopressor 33m BID (d/c Toprol and diltiazem orders for now) - if encephalopathy is prohibitive of oral meds can switch back to a scheduled IV metoprolol regimen - have reached out to pharmacist to help "catch up" Eliquis dosing, would avoid holding in the future when possible  3. ERSD on HD - per nephrology - follow BP as above  4. Metabolic encephalopathy - felt due to infection per IM      Risk Assessment/Risk Scores:          CHA2DS2-VASc Score = 4   This indicates a 4.8% annual risk of stroke. The patient's score is based upon: CHF History: 0 HTN History: 1 Diabetes History: 0 Stroke History: 0 Vascular Disease History: 1 - aortic atherosclerosis Age Score: 1 Gender Score: 1         For questions or updates, please contact CTamaPlease consult [email or web address] for contact info under    Signed, DCharlie Pitter PA-C  01/19/2023 5:42 PM

## 2023-01-19 NOTE — Progress Notes (Signed)
Received patient in bed to unit.  Alert and oriented.  Informed consent signed and in chart.   TX duration:3.0  Patient tolerated well.  Dialysis circulation changed x1 during tx. ,Pt given metoprolol x1  d/t elevated heart rate, as well as Morphine for c/o leg pain.  Transported back to the room  Alert, without acute distress.  Hand-off given to patient's nurse.   Access used: AVF Access issues: none  Total UF removed: .2L Medication(s) given: Morphine, Metoprolol, Vanc. Post HD VS: 108/75,98%116,98.3 Post HD weight: 52.1kg   [facility]

## 2023-01-19 NOTE — Progress Notes (Signed)
Pt for transfer to 4E10.

## 2023-01-19 NOTE — Progress Notes (Signed)
Provider saw pt during hd tx. Notified of pts c/o chest and leg pain. New orders written and also advised to give pt 1 dose of Morphine. Medication was given and pt did say that medication was effective. Will continue to monitor and tx as indicated.

## 2023-01-20 ENCOUNTER — Encounter (HOSPITAL_COMMUNITY)
Admission: EM | Disposition: A | Discharge status: Skilled Nursing Facility | Payer: Self-pay | Source: Home / Self Care | Attending: Internal Medicine

## 2023-01-20 ENCOUNTER — Inpatient Hospital Stay (HOSPITAL_COMMUNITY): Admission: RE | Admit: 2023-01-20 | Payer: Medicare (Managed Care) | Source: Home / Self Care | Admitting: Cardiology

## 2023-01-20 ENCOUNTER — Inpatient Hospital Stay (HOSPITAL_COMMUNITY): Payer: Medicare (Managed Care)

## 2023-01-20 DIAGNOSIS — M4646 Discitis, unspecified, lumbar region: Secondary | ICD-10-CM | POA: Diagnosis not present

## 2023-01-20 DIAGNOSIS — M8660 Other chronic osteomyelitis, unspecified site: Secondary | ICD-10-CM | POA: Diagnosis not present

## 2023-01-20 DIAGNOSIS — I4891 Unspecified atrial fibrillation: Secondary | ICD-10-CM

## 2023-01-20 DIAGNOSIS — M8610 Other acute osteomyelitis, unspecified site: Secondary | ICD-10-CM | POA: Diagnosis not present

## 2023-01-20 DIAGNOSIS — M4626 Osteomyelitis of vertebra, lumbar region: Secondary | ICD-10-CM | POA: Diagnosis not present

## 2023-01-20 SURGERY — CANCELLED PROCEDURE

## 2023-01-20 MED ORDER — METOPROLOL TARTRATE 50 MG PO TABS
50.0000 mg | ORAL_TABLET | Freq: Four times a day (QID) | ORAL | Status: DC
  Administered 2023-01-20 – 2023-01-21 (×4): 50 mg via ORAL
  Filled 2023-01-20 (×4): qty 1

## 2023-01-20 MED ORDER — AMIODARONE HCL 200 MG PO TABS: 200.0000 mg | ORAL_TABLET | Freq: Every day | ORAL | Status: DC

## 2023-01-20 MED ORDER — AMIODARONE HCL 200 MG PO TABS
200.0000 mg | ORAL_TABLET | Freq: Two times a day (BID) | ORAL | Status: DC
  Administered 2023-01-20 – 2023-01-25 (×12): 200 mg via ORAL
  Filled 2023-01-20 (×12): qty 1

## 2023-01-20 MED ORDER — METOPROLOL TARTRATE 25 MG PO TABS: 25.0000 mg | ORAL_TABLET | Freq: Four times a day (QID) | ORAL | Status: DC

## 2023-01-20 NOTE — Progress Notes (Signed)
[facility] Infectious Diseases Follow Up Note  Patient Identification: Patient Name: Diamond [name] MRN: [id number] [place] Date: 01/15/2023 12:11 PM Age: 72 y.o.Today's Date: 01/20/2023  Reason for Visit: Fu on MRSA bacteremia   Principal Problem:   Acute on chronic osteomyelitis [facility]) Active Problems:   Hypertension   ESRD on hemodialysis ([place])   Paroxysmal atrial flutter (HCC)   Acute metabolic encephalopathy   Metabolic acidosis   Antibiotics: Vancomycin with HD 2/10-c  Lines/Hardwares: Left arm AVF   Interval Events: afebrile in the last 24 hrs  Assessment # Recurrent MRSA bacteremia ( Jan 2023, July 2023, October 2023) 2/10 blood culture -MRSA 2/11 blood culture 2 x 2 sets -no growth in 3 days 2/12 TTE mild thickening of the aortic valve;  previously noted small mobile density during 0.5 x 0.9 cm in the RVOT seen on TTE 08/16/2022 not seen in subsequent TTE.  Of note, TEE 12/22/21 2.1 x 1.37 multilobulated mobile mass attached to the RA/IVC junction which has the appearance of thrombus/vegetation in the setting of bacteremia and recently remove dialysis catheter.   # Persistent osteomyelitis/discitis at L2-L3 and L4-L5 with slight interval worsening and paravertebral inflammatory changes with superimposed phlegmom 2.3 cm heterogeneous area involving the left psoas muscle at the level of L3  -No drainable fluid collection in MRI L-spine 2/10   # Paroxysmal A fib - IV amiodarone> PO, cardiology following  # ESRD om HD via left arm aVF - no signs of infection at aVF.  Evaluated by [facility] with no concerns  # Chronic Hepatitis C - HCV RNA 941000 in 07/21/22, hep B surface ag NR in 01/17/23, HIV NR 10/2019, Liver enzymes 2/10 wnl   Recommendations Continue vancomycin, with HD  Follow-up repeat blood cultures 2/11 for clearance  Cardiology planning for TEE once consent obtained. Would favor getting TEE even if prolonged IV abtx  expected given abnormalities in prior echoes with possible endocarditis in a high risk patient with ESRD  Monitor for metastatic sites of infection to guide further imaging as well as worsening of back pain/neurological exam for repeat MRI L spine   Following intermittently pending TEE   Rest of the management as per the primary team. Thank you for the consult. Please page with pertinent questions or concerns.  ______________________________________________________________________ Subjective patient seen and examined at the bedside. Complains of bilateral foot pain. Back pain is stable   Vitals BP (!) 152/98 (BP Location: Right Arm)   Pulse (!) 121   Temp 98.2 F (36.8 C) (Oral)   Resp 20   Ht 5' 6"$  (1.676 m)   Wt 53 kg   SpO2 100%   BMI 18.86 kg/m      Physical Exam Constitutional:  elderly think black lady lying in the bed, appears comfortable     Comments:   Cardiovascular:     Rate and Rhythm: Normal rate and Irregular rhythm.     Heart sounds: murmur +   Pulmonary:     Effort: Pulmonary effort is normal on room air     Comments: Normal breath sounds   Abdominal:     Palpations: Abdomen is soft.     Tenderness: non distended and non tender   Musculoskeletal:        General: No swelling or tenderness in peripheral joints.  Skin:    Comments: No obvious rashes, left AVF with no swelling/redness/tenderness or fluctuance   Neurological:     General: grossly non focal, awake, alert and oriented, follows commands,  poor memory   Pertinent Microbiology Results for orders placed or performed during the hospital encounter of 01/15/23  Blood culture (routine x 2)     Status: Abnormal   Collection Time: 01/15/23  2:25 PM   Specimen: BLOOD  Result Value Ref Range Status   Specimen Description BLOOD SITE NOT SPECIFIED  Final   Special Requests   Final    BOTTLES DRAWN AEROBIC AND ANAEROBIC Blood Culture adequate volume   Culture  Setup Time   Final    ANAEROBIC  BOTTLE ONLY GRAM POSITIVE COCCI IN CLUSTERS Organism ID to follow CRITICAL RESULT CALLED TO, READ BACK BY AND VERIFIED WITH:  C/ PHARMD C. PIERCE 01/16/23 1620 A. LAFRANCE Performed at [facility], [place] [address], [place], [place] [address]    Culture METHICILLIN RESISTANT STAPHYLOCOCCUS AUREUS (A)  Final   Report Status 01/18/2023 FINAL  Final   Organism ID, Bacteria METHICILLIN RESISTANT STAPHYLOCOCCUS AUREUS  Final      Susceptibility   Methicillin resistant staphylococcus aureus - MIC*    CIPROFLOXACIN <=0.5 SENSITIVE Sensitive     ERYTHROMYCIN <=0.25 SENSITIVE Sensitive     GENTAMICIN <=0.5 SENSITIVE Sensitive     OXACILLIN >=4 RESISTANT Resistant     TETRACYCLINE >=16 RESISTANT Resistant     VANCOMYCIN 1 SENSITIVE Sensitive     TRIMETH/SULFA <=10 SENSITIVE Sensitive     CLINDAMYCIN <=0.25 SENSITIVE Sensitive     RIFAMPIN <=0.5 SENSITIVE Sensitive     Inducible Clindamycin NEGATIVE Sensitive     * METHICILLIN RESISTANT STAPHYLOCOCCUS AUREUS  Blood Culture ID Panel (Reflexed)     Status: Abnormal   Collection Time: 01/15/23  2:25 PM  Result Value Ref Range Status   Enterococcus faecalis NOT DETECTED NOT DETECTED Final   Enterococcus Faecium NOT DETECTED NOT DETECTED Final   Listeria monocytogenes NOT DETECTED NOT DETECTED Final   Staphylococcus species DETECTED (A) NOT DETECTED Final    Comment: CRITICAL RESULT CALLED TO, READ BACK BY AND VERIFIED WITH:  C/ PHARMD C. PIERCE 01/16/23 1620 A. LAFRANCE    Staphylococcus aureus (BCID) DETECTED (A) NOT DETECTED Final    Comment: Methicillin (oxacillin)-resistant Staphylococcus aureus (MRSA). MRSA is predictably resistant to beta-lactam antibiotics (except ceftaroline). Preferred therapy is vancomycin unless clinically contraindicated. Patient requires contact precautions if  hospitalized. CRITICAL RESULT CALLED TO, READ BACK BY AND VERIFIED WITH:  C/ PHARMD C. PIERCE 01/16/23 1620 A. LAFRANCE    Staphylococcus epidermidis  NOT DETECTED NOT DETECTED Final   Staphylococcus lugdunensis NOT DETECTED NOT DETECTED Final   Streptococcus species NOT DETECTED NOT DETECTED Final   Streptococcus agalactiae NOT DETECTED NOT DETECTED Final   Streptococcus pneumoniae NOT DETECTED NOT DETECTED Final   Streptococcus pyogenes NOT DETECTED NOT DETECTED Final   A.calcoaceticus-baumannii NOT DETECTED NOT DETECTED Final   Bacteroides fragilis NOT DETECTED NOT DETECTED Final   Enterobacterales NOT DETECTED NOT DETECTED Final   Enterobacter cloacae complex NOT DETECTED NOT DETECTED Final   Escherichia coli NOT DETECTED NOT DETECTED Final   Klebsiella aerogenes NOT DETECTED NOT DETECTED Final   Klebsiella oxytoca NOT DETECTED NOT DETECTED Final   Klebsiella pneumoniae NOT DETECTED NOT DETECTED Final   Proteus species NOT DETECTED NOT DETECTED Final   Salmonella species NOT DETECTED NOT DETECTED Final   Serratia marcescens NOT DETECTED NOT DETECTED Final   Haemophilus influenzae NOT DETECTED NOT DETECTED Final   Neisseria meningitidis NOT DETECTED NOT DETECTED Final   Pseudomonas aeruginosa NOT DETECTED NOT DETECTED Final   Stenotrophomonas maltophilia NOT DETECTED  NOT DETECTED Final   Candida albicans NOT DETECTED NOT DETECTED Final   Candida auris NOT DETECTED NOT DETECTED Final   Candida glabrata NOT DETECTED NOT DETECTED Final   Candida krusei NOT DETECTED NOT DETECTED Final   Candida parapsilosis NOT DETECTED NOT DETECTED Final   Candida tropicalis NOT DETECTED NOT DETECTED Final   Cryptococcus neoformans/gattii NOT DETECTED NOT DETECTED Final   Meth resistant mecA/C and MREJ DETECTED (A) NOT DETECTED Final    Comment: CRITICAL RESULT CALLED TO, READ BACK BY AND VERIFIED WITH:  C/ PHARMD C. PIERCE 01/16/23 1620 A. LAFRANCE Performed at [facility], [place] [address], [place], [place] [address]   Culture, blood (Routine X 2) w Reflex to ID Panel     Status: None (Preliminary result)   Collection Time: 01/16/23  5:59  PM   Specimen: BLOOD RIGHT ARM  Result Value Ref Range Status   Specimen Description BLOOD RIGHT ARM  Final   Special Requests   Final    BOTTLES DRAWN AEROBIC AND ANAEROBIC Blood Culture results may not be optimal due to an inadequate volume of blood received in culture bottles   Culture   Final    NO GROWTH 3 DAYS Performed at [facility], [place] [address] [place], [place] [address]    Report Status PENDING  Incomplete  Culture, blood (Routine X 2) w Reflex to ID Panel     Status: None (Preliminary result)   Collection Time: 01/16/23  5:59 PM   Specimen: BLOOD RIGHT ARM  Result Value Ref Range Status   Specimen Description BLOOD RIGHT ARM  Final   Special Requests   Final    BOTTLES DRAWN AEROBIC AND ANAEROBIC Blood Culture results may not be optimal due to an inadequate volume of blood received in culture bottles   Culture   Final    NO GROWTH 3 DAYS Performed at [facility], [place] [address], [place], [place] [address]    Report Status PENDING  Incomplete    Pertinent Lab.    Latest Ref Rng & Units 01/19/2023    9:49 AM 01/17/2023    3:24 PM 01/16/2023    1:12 AM  CBC  WBC 4.0 - 10.5 K/uL 8.2  8.8  8.2   Hemoglobin 12.0 - 15.0 g/dL 9.7  9.6  9.6   Hematocrit 36.0 - 46.0 % 28.9  27.9  29.0   Platelets 150 - 400 K/uL 401  329  214       Latest Ref Rng & Units 01/19/2023    9:49 AM 01/17/2023    3:24 PM 01/16/2023    1:12 AM  CMP  Glucose 70 - 99 mg/dL 83  126  63   BUN 8 - 23 mg/dL 54  76  54   Creatinine 0.44 - 1.00 mg/dL 7.49  10.98  9.31   Sodium 135 - 145 mmol/L 131  135  135   Potassium 3.5 - 5.1 mmol/L 4.2  4.2  4.4   Chloride 98 - 111 mmol/L 88  93  92   CO2 22 - 32 mmol/L 24  23  23   $ Calcium 8.9 - 10.3 mg/dL 9.8  9.1  9.2      Pertinent Imaging today Plain films and CT images have been personally visualized and interpreted; radiology reports have been reviewed. Decision making incorporated into the Impression / Recommendations.  No results  found.  I spent 55  minutes for this patient encounter including  review of prior medical records, coordination of care with primary/other specialist with greater than 50% of time being face to face/counseling and discussing diagnostics/treatment plan with the patient/family.  Electronically signed by:   Rosiland Oz, MD Infectious Disease Physician [name] Pager: [phone]

## 2023-01-20 NOTE — Progress Notes (Signed)
PT Cancellation Note  Patient Details Name: VIOLETTE HELLMANN MRN: [id number] DOB: [birth year]   Cancelled Treatment:    Reason Eval/Treat Not Completed: (P) Pain limiting ability to participate, pt crying in pain of RLE and low back, RN aware, pt declining all mobility despite encouragement. Will check back as schedule allows to continue with PT POC.  Audry Riles. PTA Acute Rehabilitation Services Office: [place] 01/20/2023, 2:51 PM

## 2023-01-20 NOTE — TOC Progression Note (Signed)
Transition of Care [facility]) - Progression Note    Patient Details  Name: Diamond [name] MRN: [id number] Date of Birth: 1951/04/25  Transition of Care [facility]) CM/SW [place], [place] Phone Number: 01/20/2023, 3:27 PM  Clinical Narrative:     Received call form [facility] [place] provided update.  Thurmond Butts, MSW, LCSW Clinical Social Worker    Expected Discharge Plan: Skilled Nursing Facility Barriers to Discharge: Continued Medical Work up, SNF Pending bed offer  Expected Discharge Plan and Services       Living arrangements for the past 2 months: Single Family Home                                       Social Determinants of Health (SDOH) Interventions SDOH Screenings   Food Insecurity: No Food Insecurity (11/30/2022)  Housing: Low Risk  (11/30/2022)  Transportation Needs: No Transportation Needs (11/30/2022)  Utilities: Not At Risk (11/30/2022)  Depression (PHQ2-9): Low Risk  (01/05/2023)  Social Connections: Moderately Integrated (07/20/2022)  Tobacco Use: Low Risk  (01/19/2023)    Readmission Risk Interventions    06/30/2022    1:12 PM 06/17/2022    1:02 PM 01/22/2022   12:19 PM  Readmission Risk Prevention Plan  Transportation Screening Complete Complete Complete  Medication Review Press photographer) Complete Complete Complete  PCP or Specialist appointment within 3-5 days of discharge Complete Complete Complete  HRI or Home Care Consult Complete Complete Complete  SW Recovery Care/Counseling Consult Complete Complete Complete  Palliative Care Screening Not Applicable Not Applicable Not [place] Not Applicable Complete Not Applicable

## 2023-01-20 NOTE — Progress Notes (Signed)
PHARMACY CONSULT NOTE FOR:  OUTPATIENT  PARENTERAL ANTIBIOTIC THERAPY (OPAT)  Informational only as the patient will receive antibiotics at the outpatient HD center. Regimen and stop date communicated to the nephrology team.  Indication: MRSA discitis/osteo Regimen: Vancomycin 500 mg/HD-MWF End date: 03/13/23 (8 weeks from BCx clearance 01/16/23)  IV antibiotic discharge orders are pended. To discharging provider:  please sign these orders via discharge navigator,  Select New Orders & click on the button choice - Manage This Unsigned Work.    .  Thank you for allowing pharmacy to be a part of this patient's care.  Alycia Rossetti, PharmD, BCPS Infectious Diseases Clinical Pharmacist 01/20/2023 8:39 AM   **Pharmacist phone directory can now be found on [email or web address] ([email or web address]).  Listed under [place].

## 2023-01-20 NOTE — Hospital Course (Addendum)
Diamond [name] is an 72 y.o. 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  MRI of the lumbar spine showed persistent discitis L2-L3 and L4-5 with inflammatory changes compared to previous MRI superimposed,slightly worsened compared to previous MRI.  CRP is 33 ESR is 133.    The patient is receiving IV Vancomycin.  Cardiology does not feel that TEE is necessary as the patient will need to be on life-long antibiotics anyway.

## 2023-01-20 NOTE — Progress Notes (Signed)
Cardiology Progress Note  Patient ID: Diamond [name] MRN: [id number] DOB: Apr 11, 1951 Date of Encounter: 01/20/2023  Primary Cardiologist: Freada Bergeron, MD  Subjective   Chief Complaint: Cold feet  HPI: Reports her feet are cold.  Also describes back pain.  Unaware of her A-fib.  Rates not well-controlled.  ROS:  All other ROS reviewed and negative. Pertinent positives noted in the HPI.     Inpatient Medications  Scheduled Meds:  amiodarone  200 mg Oral BID   Followed by   Derrill Memo ON 01/27/2023] amiodarone  200 mg Oral Daily   apixaban  2.5 mg Oral Q12H   Chlorhexidine Gluconate Cloth  6 each Topical Q0600   metoprolol tartrate  50 mg Oral Q6H   pantoprazole  40 mg Oral Daily   sevelamer carbonate  1,600 mg Oral TID WC   Continuous Infusions:  vancomycin Stopped (01/19/23 1204)   PRN Meds: cloNIDine, HYDROcodone-acetaminophen, morphine injection, nitroGLYCERIN   Vital Signs   Vitals:   01/19/23 1529 01/19/23 1849 01/19/23 2008 01/19/23 2342  BP: 105/70 (!) 155/101 (!) 149/92 (!) 152/98  Pulse: (!) 132 (!) 141 (!) 125 (!) 121  Resp:  18  20  Temp: 98.9 F (37.2 C) 98.5 F (36.9 C)  98.2 F (36.8 C)  TempSrc: Oral Oral  Oral  SpO2: 99% 100% 100% 100%  Weight:      Height:        Intake/Output Summary (Last 24 hours) at 01/20/2023 0908 Last data filed at 01/19/2023 1355 Gross per 24 hour  Intake --  Output 200 ml  Net -200 ml      01/17/2023    6:14 PM 01/17/2023    2:54 PM 01/16/2023    1:45 AM  Last 3 Weights  Weight (lbs) 116 lb 13.5 oz 117 lb 11.6 oz 116 lb 6.5 oz  Weight (kg) 53 kg 53.4 kg 52.8 kg      Telemetry  Overnight telemetry shows A-fib 120s, which I personally reviewed.    Physical Exam   Vitals:   01/19/23 1529 01/19/23 1849 01/19/23 2008 01/19/23 2342  BP: 105/70 (!) 155/101 (!) 149/92 (!) 152/98  Pulse: (!) 132 (!) 141 (!) 125 (!) 121  Resp:  18  20  Temp: 98.9 F (37.2 C) 98.5 F (36.9 C)  98.2 F (36.8 C)  TempSrc: Oral  Oral  Oral  SpO2: 99% 100% 100% 100%  Weight:      Height:        Intake/Output Summary (Last 24 hours) at 01/20/2023 0908 Last data filed at 01/19/2023 1355 Gross per 24 hour  Intake --  Output 200 ml  Net -200 ml       01/17/2023    6:14 PM 01/17/2023    2:54 PM 01/16/2023    1:45 AM  Last 3 Weights  Weight (lbs) 116 lb 13.5 oz 117 lb 11.6 oz 116 lb 6.5 oz  Weight (kg) 53 kg 53.4 kg 52.8 kg    Body mass index is 18.86 kg/m.  General: Well nourished, well developed, in no acute distress Head: Atraumatic, normal size  Eyes: PEERLA, EOMI  Neck: Supple, no JVD Endocrine: No thryomegaly Cardiac: Normal S1, S2; irregular rhythm, no murmurs  Lungs: Clear to auscultation bilaterally, no wheezing, rhonchi or rales  Abd: Soft, nontender, no hepatomegaly  Ext: No edema, pulses 2+ Musculoskeletal: No deformities, BUE and BLE strength normal and equal Skin: Warm and dry, no rashes   Neuro: Alert and oriented to person, place,  time, and situation, CNII-XII grossly intact, no focal deficits  Psych: Normal mood and affect   Labs  High Sensitivity Troponin:  No results for input(s): "TROPONINIHS" in the last 720 hours.   Cardiac EnzymesNo results for input(s): "TROPONINI" in the last 168 hours. No results for input(s): "TROPIPOC" in the last 168 hours.  Chemistry Recent Labs  Lab 01/15/23 1218 01/16/23 0112 01/17/23 1524 01/19/23 0949  NA 136 135 135 131*  K 5.0 4.4 4.2 4.2  CL 92* 92* 93* 88*  CO2 27 23 23 24  $ GLUCOSE 82 63* 126* 83  BUN 46* 54* 76* 54*  CREATININE 8.76* 9.31* 10.98* 7.49*  CALCIUM 9.4 9.2 9.1 9.8  PROT 7.0  --   --   --   ALBUMIN 2.6*  --  2.1* 2.2*  AST 31  --   --   --   ALT 6  --   --   --   ALKPHOS 52  --   --   --   BILITOT 1.0  --   --   --   GFRNONAA 4* 4* 3* 5*  ANIONGAP 17* 20* 19* 19*    Hematology Recent Labs  Lab 01/16/23 0112 01/17/23 1524 01/19/23 0949  WBC 8.2 8.8 8.2  RBC 2.82* 2.80* 2.92*  HGB 9.6* 9.6* 9.7*  HCT 29.0* 27.9*  28.9*  MCV 102.8* 99.6 99.0  MCH 34.0 34.3* 33.2  MCHC 33.1 34.4 33.6  RDW 19.8* 20.6* 20.2*  PLT 214 329 401*   BNPNo results for input(s): "BNP", "PROBNP" in the last 168 hours.  DDimer No results for input(s): "DDIMER" in the last 168 hours.   Radiology  No results found.  Cardiac Studies  TTE 01/17/2023  1. Left ventricular ejection fraction, by estimation, is 60 to 65%. The  left ventricle has normal function. The left ventricle has no regional  wall motion abnormalities. Left ventricular diastolic parameters are  consistent with Grade II diastolic  dysfunction (pseudonormalization).   2. Right ventricular systolic function is normal. The right ventricular  size is normal. There is mildly elevated pulmonary artery systolic  pressure.   3. The mitral valve is abnormal. Trivial mitral valve regurgitation. No  evidence of mitral stenosis. Moderate mitral annular calcification.   4. The aortic valve is tricuspid. There is moderate calcification of the  aortic valve. There is mild thickening of the aortic valve. Aortic valve  regurgitation is trivial. Aortic valve sclerosis/calcification is present,  without any evidence of aortic  stenosis.   5. The inferior vena cava is normal in size with greater than 50%  respiratory variability, suggesting right atrial pressure of 3 mmHg.   Patient Profile  72 year old female with history of ESRD on hemodialysis, MRSA bacteremia with prior infectious endocarditis, chronic discitis, paroxysmal atrial fibrillation, hypertension who was admitted to the hospital with discitis. Cardiology consulted for atrial fibrillation with RVR   Assessment & Plan   # Paroxysmal atrial fibrillation -Known history of this.  Currently admitted with sepsis and bacteremia.  Has lumbar discitis. -Rates are poorly controlled IV amiodarone.  This was pursued as she was not taking oral medications yesterday. -Would recommend increased rate control.  Increase  metoprolol to 50 mg every 6 hours. -Transition to oral amiodarone.  Hopefully we can get her to convert on this. -Continue Eliquis 2.5 mg twice daily. -We can add a calcium channel blocker or increase metoprolol as we are able.  Blood pressure is much improved today.  # MRSA bacteremia #  Persistent osteomyelitis discitis of the lumbar spine # Prior infective endocarditis -Admitted with recurrent bacteremia.  Source is likely related to lumbar discitis.  Has chronic osteomyelitis in this region as well.  She has had prior endocarditis with vegetation in the right atrium. -Discussed transesophageal echo with daughter by phone yesterday.  She had concerns about anesthesia.  Unable to reach her by phone today.  We will attempt to reach her this afternoon. -For now, the patient is not consentable for transesophageal echo.  # Metabolic encephalopathy -Secondary to sepsis.  # ESRD on hemodialysis -Per nephrology.    For questions or updates, please contact [place] Please consult [email or web address] for contact info under        Signed, [place] B[place] T. Audie Box, MD, [place]  01/20/2023 9:08 AM

## 2023-01-20 NOTE — Progress Notes (Signed)
PROGRESS NOTE  Diamond [name] [id number] DOB: 07/10/1951 DOA: 01/15/2023 PCP: [facility]  Brief History   Diamond [name] is an 72 y.o. 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  MRI of the lumbar spine showed persistent discitis L2-L3 and L4-5 with inflammatory changes compared to previous MRI superimposed,slightly worsened compared to previous MRI.  CRP is 33 ESR is 133.  TEE scheduled for 01/11/2024. However when the family was approached by cardiology, the family would not agree to procedure until they were able to discuss the procedure with anesthesiology. Anesthesiology has been given contact information for the patient's son, Marchia Bond and her daughter [name]. I appreciate anesthesia's assistance.  The patient is receiving IV Vancomycin.  Consultants  Nephrology Cardiology Anesthesiology  Procedures  None  Antibiotics   Anti-infectives (From admission, onward)    Start     Dose/Rate Route Frequency Ordered Stop   01/17/23 1200  vancomycin (VANCOREADY) IVPB 500 mg/100 mL        500 mg 100 mL/hr over 60 Minutes Intravenous Every M-W-F (Hemodialysis) 01/16/23 0056     01/15/23 2300  vancomycin (VANCOREADY) IVPB 1250 mg/250 mL        1,250 mg 166.7 mL/hr over 90 Minutes Intravenous  Once 01/15/23 2258 01/16/23 0139        Subjective  The patient is awake and alert. She is able to answer questions appropriately, but appears to be somewhat lethargic.   Objective   Vitals:  Vitals:   01/20/23 1221 01/20/23 1643  BP: (!) 146/91 (!) 143/94  Pulse: (!) 101 (!) 122  Resp: 16 16  Temp: 97.7 F (36.5 C) 97.7 F (36.5 C)  SpO2: 100% 100%    Exam:  Constitutional:  Appears calm  and comfortable Respiratory:  CTA bilaterally, no w/r/r.  Respiratory effort normal. No retractions or accessory muscle use Cardiovascular:  RRR, no m/r/g No LE extremity edema   Normal pedal pulses Abdomen:  Abdomen appears normal; no tenderness or masses No hernias No HSM Musculoskeletal:  Digits/nails BUE: no clubbing, cyanosis, petechiae, infection exam of joints, bones, muscles of at least one of following: head/neck, RUE, LUE, RLE, LLE   strength and tone normal, no atrophy, no abnormal movements No tenderness, masses Normal ROM, no contractures  gait and station Skin:  No rashes, lesions, ulcers palpation of skin: no induration or nodules Neurologic:  CN 2-12 intact Sensation all 4 extremities intact Psychiatric:  Mental status Mood, affect appropriate Orientation to person, place,  I have personally reviewed the following:   Today's Data   Vitals:   01/20/23 1221 01/20/23 1643  BP: (!) 146/91 (!) 143/94  Pulse: (!) 101 (!) 122  Resp: 16 16  Temp: 97.7 F (36.5 C) 97.7 F (36.5 C)  SpO2: 100% 100%     Lab Data  CBC    Component Value Date/Time   WBC 8.2 01/19/2023 0949   RBC 2.92 (L) 01/19/2023 0949   HGB 9.7 (L) 01/19/2023 0949   HCT 28.9 (L) 01/19/2023 0949   PLT 401 (H) 01/19/2023 0949   MCV 99.0 01/19/2023 0949   MCH 33.2 01/19/2023 0949   MCHC 33.6 01/19/2023 0949   RDW 20.2 (H) 01/19/2023 0949   LYMPHSABS 0.7 01/15/2023 1218   MONOABS 0.6 01/15/2023 1218   EOSABS 0.0 01/15/2023 1218  BASOSABS 0.0 01/15/2023 1218      Latest Ref Rng & Units 01/19/2023    9:49 AM 01/17/2023    3:24 PM 01/16/2023    1:12 AM  BMP  Glucose 70 - 99 mg/dL 83  126  63   BUN 8 - 23 mg/dL 54  76  54   Creatinine 0.44 - 1.00 mg/dL 7.49  10.98  9.31   Sodium 135 - 145 mmol/L 131  135  135   Potassium 3.5 - 5.1 mmol/L 4.2  4.2  4.4   Chloride 98 - 111 mmol/L 88  93  92   CO2 22 - 32 mmol/L 24  23  23   $ Calcium 8.9 - 10.3 mg/dL 9.8  9.1  9.2      Micro  Data   Results for orders placed or performed during the hospital encounter of 01/15/23  Blood culture (routine x 2)     Status: Abnormal   Collection Time: 01/15/23  2:25 PM   Specimen: BLOOD  Result Value Ref Range Status   Specimen Description BLOOD SITE NOT SPECIFIED  Final   Special Requests   Final    BOTTLES DRAWN AEROBIC AND ANAEROBIC Blood Culture adequate volume   Culture  Setup Time   Final    ANAEROBIC BOTTLE ONLY GRAM POSITIVE COCCI IN CLUSTERS Organism ID to follow CRITICAL RESULT CALLED TO, READ BACK BY AND VERIFIED WITH:  C/ PHARMD C. PIERCE 01/16/23 1620 A. LAFRANCE Performed at [facility], [place] [address], [place], [place] [address]    Culture METHICILLIN RESISTANT STAPHYLOCOCCUS AUREUS (A)  Final   Report Status 01/18/2023 FINAL  Final   Organism ID, Bacteria METHICILLIN RESISTANT STAPHYLOCOCCUS AUREUS  Final      Susceptibility   Methicillin resistant staphylococcus aureus - MIC*    CIPROFLOXACIN <=0.5 SENSITIVE Sensitive     ERYTHROMYCIN <=0.25 SENSITIVE Sensitive     GENTAMICIN <=0.5 SENSITIVE Sensitive     OXACILLIN >=4 RESISTANT Resistant     TETRACYCLINE >=16 RESISTANT Resistant     VANCOMYCIN 1 SENSITIVE Sensitive     TRIMETH/SULFA <=10 SENSITIVE Sensitive     CLINDAMYCIN <=0.25 SENSITIVE Sensitive     RIFAMPIN <=0.5 SENSITIVE Sensitive     Inducible Clindamycin NEGATIVE Sensitive     * METHICILLIN RESISTANT STAPHYLOCOCCUS AUREUS  Blood Culture ID Panel (Reflexed)     Status: Abnormal   Collection Time: 01/15/23  2:25 PM  Result Value Ref Range Status   Enterococcus faecalis NOT DETECTED NOT DETECTED Final   Enterococcus Faecium NOT DETECTED NOT DETECTED Final   Listeria monocytogenes NOT DETECTED NOT DETECTED Final   Staphylococcus species DETECTED (A) NOT DETECTED Final    Comment: CRITICAL RESULT CALLED TO, READ BACK BY AND VERIFIED WITH:  C/ PHARMD C. PIERCE 01/16/23 1620 A. LAFRANCE    Staphylococcus aureus (BCID) DETECTED (A) NOT  DETECTED Final    Comment: Methicillin (oxacillin)-resistant Staphylococcus aureus (MRSA). MRSA is predictably resistant to beta-lactam antibiotics (except ceftaroline). Preferred therapy is vancomycin unless clinically contraindicated. Patient requires contact precautions if  hospitalized. CRITICAL RESULT CALLED TO, READ BACK BY AND VERIFIED WITH:  C/ PHARMD C. PIERCE 01/16/23 1620 A. LAFRANCE    Staphylococcus epidermidis NOT DETECTED NOT DETECTED Final   Staphylococcus lugdunensis NOT DETECTED NOT DETECTED Final   Streptococcus species NOT DETECTED NOT DETECTED Final   Streptococcus agalactiae NOT DETECTED NOT DETECTED Final   Streptococcus pneumoniae NOT DETECTED NOT DETECTED Final   Streptococcus pyogenes NOT DETECTED NOT DETECTED  Final   A.calcoaceticus-baumannii NOT DETECTED NOT DETECTED Final   Bacteroides fragilis NOT DETECTED NOT DETECTED Final   Enterobacterales NOT DETECTED NOT DETECTED Final   Enterobacter cloacae complex NOT DETECTED NOT DETECTED Final   Escherichia coli NOT DETECTED NOT DETECTED Final   Klebsiella aerogenes NOT DETECTED NOT DETECTED Final   Klebsiella oxytoca NOT DETECTED NOT DETECTED Final   Klebsiella pneumoniae NOT DETECTED NOT DETECTED Final   Proteus species NOT DETECTED NOT DETECTED Final   Salmonella species NOT DETECTED NOT DETECTED Final   Serratia marcescens NOT DETECTED NOT DETECTED Final   Haemophilus influenzae NOT DETECTED NOT DETECTED Final   Neisseria meningitidis NOT DETECTED NOT DETECTED Final   Pseudomonas aeruginosa NOT DETECTED NOT DETECTED Final   Stenotrophomonas maltophilia NOT DETECTED NOT DETECTED Final   Candida albicans NOT DETECTED NOT DETECTED Final   Candida auris NOT DETECTED NOT DETECTED Final   Candida glabrata NOT DETECTED NOT DETECTED Final   Candida krusei NOT DETECTED NOT DETECTED Final   Candida parapsilosis NOT DETECTED NOT DETECTED Final   Candida tropicalis NOT DETECTED NOT DETECTED Final   Cryptococcus  neoformans/gattii NOT DETECTED NOT DETECTED Final   Meth resistant mecA/C and MREJ DETECTED (A) NOT DETECTED Final    Comment: CRITICAL RESULT CALLED TO, READ BACK BY AND VERIFIED WITH:  C/ PHARMD C. PIERCE 01/16/23 1620 A. LAFRANCE Performed at [facility], [place] [address] [place], [place] [address]   Culture, blood (Routine X 2) w Reflex to ID Panel     Status: None (Preliminary result)   Collection Time: 01/16/23  5:59 PM   Specimen: BLOOD RIGHT ARM  Result Value Ref Range Status   Specimen Description BLOOD RIGHT ARM  Final   Special Requests   Final    BOTTLES DRAWN AEROBIC AND ANAEROBIC Blood Culture results may not be optimal due to an inadequate volume of blood received in culture bottles   Culture   Final    NO GROWTH 4 DAYS Performed at [facility], [place] [address], [place], [place] [address]    Report Status PENDING  Incomplete  Culture, blood (Routine X 2) w Reflex to ID Panel     Status: None (Preliminary result)   Collection Time: 01/16/23  5:59 PM   Specimen: BLOOD RIGHT ARM  Result Value Ref Range Status   Specimen Description BLOOD RIGHT ARM  Final   Special Requests   Final    BOTTLES DRAWN AEROBIC AND ANAEROBIC Blood Culture results may not be optimal due to an inadequate volume of blood received in culture bottles   Culture   Final    NO GROWTH 4 DAYS Performed at [facility], [place] [address] [place], [place] [address]    Report Status PENDING  Incomplete    Scheduled Meds:  amiodarone  200 mg Oral BID   Followed by   Derrill Memo ON 01/27/2023] amiodarone  200 mg Oral Daily   apixaban  2.5 mg Oral Q12H   Chlorhexidine Gluconate Cloth  6 each Topical Q0600   metoprolol tartrate  50 mg Oral Q6H   pantoprazole  40 mg Oral Daily   sevelamer carbonate  1,600 mg Oral TID WC   Continuous Infusions:  vancomycin Stopped (01/19/23 1204)    Principal Problem:   Acute on chronic osteomyelitis ([place]) Active Problems:   Acute metabolic  encephalopathy   ESRD on hemodialysis ([place])   Paroxysmal atrial flutter ([place])   Hypertension   Discitis of lumbar region  Metabolic acidosis   LOS: 4 days   A & P  Paroxysmal atrial flutter (H[facility]) -continue Eliquis -Rate is controlled on metoprilol sucinate 50 mg daily. This was not initiallycontinued as inpatient due to the patient's bradycardia on presentation. Will continue metoprolol tartrate at 25 mg bid for now due to HR in the 110's despite IV amiodarone. The patient is also somewhat hypertensive. - Monitor on telemetry.   Hypertension -Metoprolol will be resumed as 25 mg tartrate bid instead of the 50 mg succinate she was taking at home due to the low heart rates recorded upon arrival. Monitor on telemetry. The patient is somewhat hypertensive currently.  Acute on chronic osteomyelitis (HCC) -hx of recurrent MRSA bacteremia with lumbar discitis/osteomyelitis on approximately 4-5 months of antibiotics here with worsening discitis at L2-3 and L4-5 as well developing phlegmon at L3 and left psoas muscle. ESR and CPR both elevated. -follows with ID Dr. Gale Journey outpatient and last on Bactrim on 01/05/2023.  -blood cultures are pending -infectious disease Dr. Tommy Medal recommends placing her on IV Vancomycin and will see in consultation.   ESRD on hemodialysis ([place]) -Pt tells me her days are MWF but per documentation it is Tues/Thurs/Sat. Cannot recall her last dialysis session. Creatinine is elevated to 8.3 with previous around 4-6 suggestive that she may have missed a session. Nephrology has been consulted. The patient has been continued on her MWF dialysis schedule.   Acute metabolic encephalopathy Due to acute infection and missed dialysis. Leel of alertness appears to be improved, however the patient is still not able to consent for TEE. The daughter [name] or son Marchia Bond will consent for that procedure.  Metabolic acidosis -secondary to worsening renal function. Elevated creatinine  suggestive of missed dialysis session. Has elevated anion gap at 17 and appears dehydration on exam. Will give small volume 500ccc LR bolus fluid.   Discitis of lumbar region Levels 2-3 and 4-5.  The patient is receiving vancomycin.   DVT prophylaxis: Eliquis Code Status: Full Code Family Communication: None Disposition Plan: tbd    Johnice Riebe, DO [facility] Direct contact: see [email or web address]  7PM-7AM contact night coverage as above 01/20/2023, 4:49 PM  LOS: 4 days

## 2023-01-20 NOTE — Assessment & Plan Note (Signed)
Levels 2-3 and 4-5.  The patient is receiving vancomycin.

## 2023-01-20 NOTE — Progress Notes (Signed)
Patient ID: Diamond [name], female   DOB: September 20, 1951, 72 y.o.   MRN: [id number] [facility] Progress Note   Assessment/ Plan:   1.  Recurrent MRSA infection with persistent osteomyelitis/discitis of L2-L3 and L4-L5: Seen by infectious disease with recommendations made for intravenous antimicrobial therapy with vancomycin for the next 8 weeks followed by lifelong suppressive antibiotics. Additional workup with TTE showed diastolic dysfunction and MV/TV abnormalities- TEE deferred after consent not given by family because of anesthesia concerns. 2.  End-stage renal disease: Continue hemodialysis on a Monday/Wednesday/Friday schedule via left brachiocephalic fistula with next hemodialysis treatment ordered for tomorrow.  Oral intake previously limited by encephalopathy however appears to be picking up and we will attempt to probe EDW/ultrafiltration with hemodialysis tomorrow. 3.  Hypertension: Blood pressures elevated, on metoprolol and as needed clonidine. 4.  Acute metabolic encephalopathy: Secondary to acute infection +/- missed hemodialysis treatments.  This is resolved with initiation of antibiotics and hemodialysis. 5.  Anemia of chronic disease: Likely compounded by the ESA resistance in the setting of infection.  No overt losses noted and will continue to monitor H/H trend. 6.  Secondary hyperparathyroidism: Continue renal diet with resumption of phosphorus binders for phosphorus control.  Not on Sensipar or vitamin D receptor analog. 7.  Atrial fibrillation with rapid ventricular response: Yesterday started on amiodarone drip status post bolus.  Remains on metoprolol and ongoing cardiology evaluation.   Subjective:   Yesterday evening with episode of atrial fibrillation with rapid ventricular response, started on amiodarone drip.  She complains of pain in her back as well as left lower extremity.   Objective:   BP (!) 152/98 (BP Location: Right Arm)   Pulse (!) 121   Temp 98.2  F (36.8 C) (Oral)   Resp 20   Ht 5' 6"$  (1.676 m)   Wt 53 kg   SpO2 100%   BMI 18.86 kg/m   Physical Exam: Gen: Appears comfortable resting in bed, not in distress. [id number] irregular tachycardia, normal S1 and S2   Resp: Decreased breath sounds over bases without distinct rales or rhonchi. [id number], flat, non-tender Ext:No lower extremity edema. L BCF with intact dressings.   Labs: BMET Recent Labs  Lab 01/15/23 1218 01/16/23 0112 01/17/23 1524 01/19/23 0949  NA 136 135 135 131*  K 5.0 4.4 4.2 4.2  CL 92* 92* 93* 88*  CO2 27 23 23 24  $ GLUCOSE 82 63* 126* 83  BUN 46* 54* 76* 54*  CREATININE 8.76* 9.31* 10.98* 7.49*  CALCIUM 9.4 9.2 9.1 9.8  PHOS  --   --  8.3* 7.0*   CBC Recent Labs  Lab 01/15/23 1218 01/16/23 0112 01/17/23 1524 01/19/23 0949  WBC 8.2 8.2 8.8 8.2  NEUTROABS 6.8  --   --   --   HGB 9.3* 9.6* 9.6* 9.7*  HCT 28.7* 29.0* 27.9* 28.9*  MCV 106.3* 102.8* 99.6 99.0  PLT 246 214 329 401*     Medications:     apixaban  2.5 mg Oral Q12H   Chlorhexidine Gluconate Cloth  6 each Topical Q0600   metoprolol tartrate  25 mg Oral Q6H   pantoprazole  40 mg Oral Daily   sevelamer carbonate  1,600 mg Oral TID WC   Elmarie Shiley, MD 01/20/2023, 8:36 AM

## 2023-01-21 DIAGNOSIS — M4646 Discitis, unspecified, lumbar region: Secondary | ICD-10-CM | POA: Diagnosis not present

## 2023-01-21 DIAGNOSIS — M8610 Other acute osteomyelitis, unspecified site: Secondary | ICD-10-CM | POA: Diagnosis not present

## 2023-01-21 DIAGNOSIS — I4892 Unspecified atrial flutter: Secondary | ICD-10-CM | POA: Diagnosis not present

## 2023-01-21 DIAGNOSIS — M8660 Other chronic osteomyelitis, unspecified site: Secondary | ICD-10-CM | POA: Diagnosis not present

## 2023-01-21 DIAGNOSIS — M4626 Osteomyelitis of vertebra, lumbar region: Secondary | ICD-10-CM | POA: Diagnosis not present

## 2023-01-21 LAB — CBC WITH DIFFERENTIAL/PLATELET
Abs Immature Granulocytes: 0.04 10*3/uL (ref 0.00–0.07)
Basophils Absolute: 0 10*3/uL (ref 0.0–0.1)
Basophils Relative: 0 %
Eosinophils Absolute: 0.1 10*3/uL (ref 0.0–0.5)
Eosinophils Relative: 1 %
HCT: 28.5 % — ABNORMAL LOW (ref 36.0–46.0)
Hemoglobin: 9.6 g/dL — ABNORMAL LOW (ref 12.0–15.0)
Immature Granulocytes: 1 %
Lymphocytes Relative: 12 %
Lymphs Abs: 0.9 10*3/uL (ref 0.7–4.0)
MCH: 32.5 pg (ref 26.0–34.0)
MCHC: 33.7 g/dL (ref 30.0–36.0)
MCV: 96.6 fL (ref 80.0–100.0)
Monocytes Absolute: 0.4 10*3/uL (ref 0.1–1.0)
Monocytes Relative: 6 %
Neutro Abs: 5.8 10*3/uL (ref 1.7–7.7)
Neutrophils Relative %: 80 %
Platelets: 414 10*3/uL — ABNORMAL HIGH (ref 150–400)
RBC: 2.95 MIL/uL — ABNORMAL LOW (ref 3.87–5.11)
RDW: 20.7 % — ABNORMAL HIGH (ref 11.5–15.5)
WBC: 7.1 10*3/uL (ref 4.0–10.5)
nRBC: 0 % (ref 0.0–0.2)

## 2023-01-21 LAB — CULTURE, BLOOD (ROUTINE X 2)
Culture: NO GROWTH
Culture: NO GROWTH

## 2023-01-21 LAB — BASIC METABOLIC PANEL
Anion gap: 16 — ABNORMAL HIGH (ref 5–15)
BUN: 37 mg/dL — ABNORMAL HIGH (ref 8–23)
CO2: 24 mmol/L (ref 22–32)
Calcium: 9.6 mg/dL (ref 8.9–10.3)
Chloride: 94 mmol/L — ABNORMAL LOW (ref 98–111)
Creatinine, Ser: 5.73 mg/dL — ABNORMAL HIGH (ref 0.44–1.00)
GFR, Estimated: 7 mL/min — ABNORMAL LOW (ref >60)
Glucose, Bld: 107 mg/dL — ABNORMAL HIGH (ref 70–99)
Potassium: 4.3 mmol/L (ref 3.5–5.1)
Sodium: 134 mmol/L — ABNORMAL LOW (ref 135–145)

## 2023-01-21 LAB — VANCOMYCIN, RANDOM: Vancomycin Rm: 16 ug/mL

## 2023-01-21 MED ORDER — VANCOMYCIN IV (FOR PTA / DISCHARGE USE ONLY): 500.0000 mg | INTRAVENOUS | 0 refills | Status: AC

## 2023-01-21 MED ORDER — METOPROLOL TARTRATE 50 MG PO TABS
50.0000 mg | ORAL_TABLET | Freq: Two times a day (BID) | ORAL | Status: DC
  Administered 2023-01-21 – 2023-01-25 (×9): 50 mg via ORAL
  Filled 2023-01-21 (×9): qty 1

## 2023-01-21 NOTE — Progress Notes (Signed)
OT Cancellation Note  Patient Details Name: DORRI BERGARA MRN: [id number] DOB: 17-Jun-1951   Cancelled Treatment:    Reason Eval/Treat Not Completed: Pain limiting ability to participate pt reports increased pain in her side, declining OT session at this time, will f/u as time allows for OT session.  Harley Alto., COTA/L Acute Rehabilitation Services [phone]   Precious Haws 01/21/2023, 8:39 AM

## 2023-01-21 NOTE — Progress Notes (Signed)
Pharmacy Antibiotic Note  Diamond [name] is a 72 y.o. female admitted on 01/15/2023 with recurrent MRSA bacteremia/lumbar discitis/vertebral osteomyelitis. Planning for 8 weeks of vancomycin followed by lifelong PO suppression.  Vancomycin Random 16 mcg/mL this morning which is within goal of 15-25 mcg/mL.   HD schedule remains MWF  Plan: Continue vancomycin 557m IV qHD on MWF after dialysis Expect therapy to continue through 03/11/2023 pending clinical course and results of TEE and MRI Will obtain vanc levels as needed   Temp (24hrs), Avg:98.1 F (36.7 C), Min:97.7 F (36.5 C), Max:99.1 F (37.3 C)  Recent Labs  Lab 01/15/23 1218 01/15/23 1219 01/16/23 0112 01/17/23 1524 01/19/23 0949 01/21/23 0238  WBC 8.2  --  8.2 8.8 8.2 7.1  CREATININE 8.76*  --  9.31* 10.98* 7.49* 5.73*  LATICACIDVEN  --  1.9  --   --   --   --   VANCORANDOM  --   --   --   --   --  16     Estimated Creatinine Clearance: 7.3 mL/min (A) (by C-G formula based on SCr of 5.73 mg/dL (H)).    Allergies  Allergen Reactions   Shrimp (Diagnostic) Other (See Comments)    Unknown reaction   Other Swelling and Rash    Anesthesia - facial swelling, rash   LErskine Speed PharmD Clinical Pharmacist

## 2023-01-21 NOTE — Progress Notes (Signed)
Physical Therapy Treatment Patient Details Name: Diamond [name] MRN: [id number] DOB: 1951/09/24 Today's Date: 01/21/2023   History of Present Illness Diamond [name] is an 72 y.o. who presented to ED with worsening back pain and weakness. MRI of the L-spine today showed worsening of persistent finding for osteomyelitis and discitis at L2-3, L4-5. 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    PT Comments    Pt seen for PT tx with pt received in bed, minimal engagement with PT. Pt is able to voluntarily move BLE but only participates in 3 reps of ankle pumps upon cuing. Pt's bed pad noted to be soiled so PT encouraged pt to participate in rolling, however pt with poor ability follow instructions or participate, therefore requires total assist to roll L<>R to allow PT to change pad. Continue to recommend STR upon d/c.    Recommendations for follow up therapy are one component of a multi-disciplinary discharge planning process, led by the attending physician.  Recommendations may be updated based on patient status, additional functional criteria and insurance authorization.  Follow Up Recommendations  Skilled nursing-short term rehab (<3 hours/day) Can patient physically be transported by private vehicle: No   Assistance Recommended at Discharge Frequent or constant Supervision/Assistance  Patient can return home with the following A lot of help with walking and/or transfers;A lot of help with bathing/dressing/bathroom;Direct supervision/assist for medications management;Assist for transportation;Help with stairs or ramp for entrance   Equipment Recommendations  None recommended by PT    Recommendations for Other Services       Precautions / Restrictions Precautions Precautions: Fall Precaution Comments: back  precautions for comfort Restrictions Weight Bearing Restrictions: No     Mobility  Bed Mobility   Bed Mobility: Rolling Rolling: Total assist         General bed mobility comments: Pt with very poor participation with rolling L<>R in bed. PT provides total assist for positioning BLE, placing UE on bed rail, total assist for rolling & scooting to HOB.    Transfers                        Ambulation/Gait                   Stairs             Wheelchair Mobility    Modified Rankin (Stroke Patients Only)       Balance                                            Cognition Arousal/Alertness: Awake/alert Behavior During Therapy: Flat affect Overall Cognitive Status: No family/caregiver present to determine baseline cognitive functioning Area of Impairment: Orientation, Attention, Following commands, Safety/judgement, Awareness, Problem solving, Memory                 Orientation Level: Disoriented to, Place, Time, Situation   Memory: Decreased short-term memory Following Commands:  (does not follow one step commands even with extra time) Safety/Judgement: Decreased awareness of safety, Decreased awareness of deficits Awareness: Intellectual Problem Solving: Slow processing, Decreased initiation, Difficulty sequencing, Requires verbal cues, Requires tactile cues General Comments: Pt unaware she's in the hospital, thinks she's in a church. Also states she's dealing with an "  asthma attack" today but no labored breathing noted.        Exercises Other Exercises Other Exercises: Pt performs 3 reps of BLE Ankle pumps before stopping & does not initiate again even with encouragement/cuing. Other Exercises: Pt with some volitional movement to reposition BLE in bed but unable to perform heel slide after PT performs AAROM to cue/encourage pt.    General Comments        Pertinent Vitals/Pain Pain Assessment Pain Assessment:  Faces Faces Pain Scale: Hurts even more Pain Location: back Pain Descriptors / Indicators: Discomfort, Grimacing Pain Intervention(s): Repositioned, Monitored during session    Home Living                          Prior Function            PT Goals (current goals can now be found in the care plan section) Acute Rehab PT Goals Patient Stated Goal: to walk PT Goal Formulation: With patient/family Time For Goal Achievement: 01/30/23 Potential to Achieve Goals: Fair Progress towards PT goals: PT to reassess next treatment    Frequency    Min 3X/week      PT Plan Current plan remains appropriate    Co-evaluation              AM-PAC PT "6 Clicks" Mobility   Outcome Measure  Help needed turning from your back to your side while in a flat bed without using bedrails?: Total Help needed moving from lying on your back to sitting on the side of a flat bed without using bedrails?: Total Help needed moving to and from a bed to a chair (including a wheelchair)?: Total Help needed standing up from a chair using your arms (e.g., wheelchair or bedside chair)?: Total Help needed to walk in hospital room?: Total Help needed climbing 3-5 steps with a railing? : Total 6 Click Score: 6    End of Session   Activity Tolerance:  (limited 2/2 impaired cognition) Patient left: in bed;with call bell/phone within reach;with bed alarm set   PT Visit Diagnosis: Unsteadiness on feet (R26.81);Muscle weakness (generalized) (M62.81);Difficulty in walking, not elsewhere classified (R26.2);Other abnormalities of gait and mobility (R26.89)     Time: 1142-1153 PT Time Calculation (min) (ACUTE ONLY): 11 min  Charges:  $Therapeutic Activity: 8-22 mins                     Diamond [name], PT, DPT 01/21/23, 12:09 PM   Diamond [name] 01/21/2023, 12:08 PM

## 2023-01-21 NOTE — Progress Notes (Signed)
PT REFUSED HD [facility] NOTIFIED

## 2023-01-21 NOTE — Progress Notes (Addendum)
Patient ID: Diamond [name], female   DOB: [birth year], 72 y.o.   MRN: [id number] [facility] Progress Note   Assessment/ Plan:   1.  Recurrent MRSA infection with persistent osteomyelitis/discitis of L2-L3 and L4-L5: Seen by infectious disease with recommendations made for intravenous antimicrobial therapy with vancomycin for the next 8 weeks (until 03/13/2023) followed by lifelong suppressive antibiotics. Additional workup with TTE showed diastolic dysfunction and MV/TV abnormalities-working towards getting consent for TEE. 2.  End-stage renal disease: Continue hemodialysis on a Monday/Wednesday/Friday schedule via left brachiocephalic fistula with next hemodialysis treatment ordered for today. 3.  Hypertension: Blood pressures elevated, on metoprolol and as needed clonidine. 4.  Acute metabolic encephalopathy: Secondary to acute infection +/- missed hemodialysis treatments.  Improved with management of infection with antibiotics and hemodialysis. 5.  Anemia of chronic disease: Likely compounded by the ESA resistance in the setting of infection.  No overt losses noted and will continue to monitor H/H trend. 6.  Secondary hyperparathyroidism: Continue renal diet with resumption of phosphorus binders for phosphorus control.  Not on Sensipar or vitamin D receptor analog. 7.  Atrial fibrillation with rapid ventricular response: Improved with up titration of metoprolol dose and now transitioned back from intravenous to oral amiodarone.  On Eliquis.   Subjective:   With improvement of rate control overnight, amiodarone drip discontinued.  She continues to complain of intermittent back pain but denies any chest pain or shortness of breath.   Objective:   BP (!) 140/87 (BP Location: Right Arm)   Pulse 68   Temp 98.3 F (36.8 C) (Axillary)   Resp 18   Ht 5' 6"$  (1.676 m)   Wt 53 kg   SpO2 100%   BMI 18.86 kg/m   Physical Exam: Gen: Comfortably resting in bed, not in discomfort or  distress CVS: Pulse irregular rhythm with normal rate, normal S1 and S2  Resp: Decreased breath sounds over bases without distinct rales or rhonchi. [id number], flat, non-tender Ext:No lower extremity edema. L BCF with intact dressings.   Labs: BMET Recent Labs  Lab 01/15/23 1218 01/16/23 0112 01/17/23 1524 01/19/23 0949 01/21/23 0238  NA 136 135 135 131* 134*  K 5.0 4.4 4.2 4.2 4.3  CL 92* 92* 93* 88* 94*  CO2 27 23 23 24 24  $ GLUCOSE 82 63* 126* 83 107*  BUN 46* 54* 76* 54* 37*  CREATININE 8.76* 9.31* 10.98* 7.49* 5.73*  CALCIUM 9.4 9.2 9.1 9.8 9.6  PHOS  --   --  8.3* 7.0*  --    CBC Recent Labs  Lab 01/15/23 1218 01/16/23 0112 01/17/23 1524 01/19/23 0949 01/21/23 0238  WBC 8.2 8.2 8.8 8.2 7.1  NEUTROABS 6.8  --   --   --  5.8  HGB 9.3* 9.6* 9.6* 9.7* 9.6*  HCT 28.7* 29.0* 27.9* 28.9* 28.5*  MCV 106.3* 102.8* 99.6 99.0 96.6  PLT 246 214 329 401* 414*     Medications:     amiodarone  200 mg Oral BID   Followed by   Derrill Memo ON 01/27/2023] amiodarone  200 mg Oral Daily   apixaban  2.5 mg Oral Q12H   Chlorhexidine Gluconate Cloth  6 each Topical Q0600   metoprolol tartrate  50 mg Oral Q6H   pantoprazole  40 mg Oral Daily   sevelamer carbonate  1,600 mg Oral TID WC   Elmarie Shiley, MD 01/21/2023, 8:41 AM

## 2023-01-21 NOTE — Progress Notes (Signed)
Cardiology Progress Note  Patient ID: Diamond [name] MRN: [id number] DOB: Jun 08, 1951 Date of Encounter: 01/21/2023  Primary Cardiologist: Freada Bergeron, MD  Subjective   Chief Complaint: Back Pain  HPI: Currently here with discitis and MRSA bacteremia.  Has converted back to sinus rhythm.  Reports back pain.  No chest pain.  ROS:  All other ROS reviewed and negative. Pertinent positives noted in the HPI.     Inpatient Medications  Scheduled Meds:  amiodarone  200 mg Oral BID   Followed by   Derrill Memo ON 01/27/2023] amiodarone  200 mg Oral Daily   apixaban  2.5 mg Oral Q12H   Chlorhexidine Gluconate Cloth  6 each Topical Q0600   metoprolol tartrate  50 mg Oral BID   pantoprazole  40 mg Oral Daily   sevelamer carbonate  1,600 mg Oral TID WC   Continuous Infusions:  vancomycin Stopped (01/19/23 1204)   PRN Meds: cloNIDine, HYDROcodone-acetaminophen, morphine injection, nitroGLYCERIN   Vital Signs   Vitals:   01/20/23 2344 01/21/23 0328 01/21/23 0332 01/21/23 0807  BP: (!) 130/99 (!) 158/72 (!) 158/72 (!) 140/87  Pulse: (!) 117 78 77 68  Resp: 15 17 16 18  $ Temp: 98.3 F (36.8 C) 99.1 F (37.3 C)  98.3 F (36.8 C)  TempSrc: Oral Oral  Axillary  SpO2: 99% 100% 100% 100%  Weight:      Height:        Intake/Output Summary (Last 24 hours) at 01/21/2023 0854 Last data filed at 01/20/2023 1938 Gross per 24 hour  Intake 374.51 ml  Output --  Net 374.51 ml      01/17/2023    6:14 PM 01/17/2023    2:54 PM 01/16/2023    1:45 AM  Last 3 Weights  Weight (lbs) 116 lb 13.5 oz 117 lb 11.6 oz 116 lb 6.5 oz  Weight (kg) 53 kg 53.4 kg 52.8 kg      Telemetry  Overnight telemetry shows sinus rhythm 70s, which I personally reviewed.   Physical Exam   Vitals:   01/20/23 2344 01/21/23 0328 01/21/23 0332 01/21/23 0807  BP: (!) 130/99 (!) 158/72 (!) 158/72 (!) 140/87  Pulse: (!) 117 78 77 68  Resp: 15 17 16 18  $ Temp: 98.3 F (36.8 C) 99.1 F (37.3 C)  98.3 F (36.8 C)   TempSrc: Oral Oral  Axillary  SpO2: 99% 100% 100% 100%  Weight:      Height:        Intake/Output Summary (Last 24 hours) at 01/21/2023 0854 Last data filed at 01/20/2023 1938 Gross per 24 hour  Intake 374.51 ml  Output --  Net 374.51 ml       01/17/2023    6:14 PM 01/17/2023    2:54 PM 01/16/2023    1:45 AM  Last 3 Weights  Weight (lbs) 116 lb 13.5 oz 117 lb 11.6 oz 116 lb 6.5 oz  Weight (kg) 53 kg 53.4 kg 52.8 kg    Body mass index is 18.86 kg/m.  General: Well nourished, well developed, in no acute distress Head: Atraumatic, normal size  Eyes: PEERLA, EOMI  Neck: Supple, no JVD Endocrine: No thryomegaly Cardiac: Normal S1, S2; RRR; no murmurs, rubs, or gallops Lungs: Clear to auscultation bilaterally, no wheezing, rhonchi or rales  Abd: Soft, nontender, no hepatomegaly  Ext: No edema, pulses 2+ Musculoskeletal: No deformities, BUE and BLE strength normal and equal Skin: Warm and dry, no rashes   Neuro: Alert and oriented to person, place,  time, and situation, CNII-XII grossly intact, no focal deficits  Psych: Normal mood and affect   Labs  High Sensitivity Troponin:  No results for input(s): "TROPONINIHS" in the last 720 hours.   Cardiac EnzymesNo results for input(s): "TROPONINI" in the last 168 hours. No results for input(s): "TROPIPOC" in the last 168 hours.  Chemistry Recent Labs  Lab 01/15/23 1218 01/16/23 0112 01/17/23 1524 01/19/23 0949 01/21/23 0238  NA 136   < > 135 131* 134*  K 5.0   < > 4.2 4.2 4.3  CL 92*   < > 93* 88* 94*  CO2 27   < > 23 24 24  $ GLUCOSE 82   < > 126* 83 107*  BUN 46*   < > 76* 54* 37*  CREATININE 8.76*   < > 10.98* 7.49* 5.73*  CALCIUM 9.4   < > 9.1 9.8 9.6  PROT 7.0  --   --   --   --   ALBUMIN 2.6*  --  2.1* 2.2*  --   AST 31  --   --   --   --   ALT 6  --   --   --   --   ALKPHOS 52  --   --   --   --   BILITOT 1.0  --   --   --   --   GFRNONAA 4*   < > 3* 5* 7*  ANIONGAP 17*   < > 19* 19* 16*   < > = values in this  interval not displayed.    Hematology Recent Labs  Lab 01/17/23 1524 01/19/23 0949 01/21/23 0238  WBC 8.8 8.2 7.1  RBC 2.80* 2.92* 2.95*  HGB 9.6* 9.7* 9.6*  HCT 27.9* 28.9* 28.5*  MCV 99.6 99.0 96.6  MCH 34.3* 33.2 32.5  MCHC 34.4 33.6 33.7  RDW 20.6* 20.2* 20.7*  PLT 329 401* 414*   BNPNo results for input(s): "BNP", "PROBNP" in the last 168 hours.  DDimer No results for input(s): "DDIMER" in the last 168 hours.   Radiology  No results found.  Cardiac Studies  TTE 01/17/2023  1. Left ventricular ejection fraction, by estimation, is 60 to 65%. The  left ventricle has normal function. The left ventricle has no regional  wall motion abnormalities. Left ventricular diastolic parameters are  consistent with Grade II diastolic  dysfunction (pseudonormalization).   2. Right ventricular systolic function is normal. The right ventricular  size is normal. There is mildly elevated pulmonary artery systolic  pressure.   3. The mitral valve is abnormal. Trivial mitral valve regurgitation. No  evidence of mitral stenosis. Moderate mitral annular calcification.   4. The aortic valve is tricuspid. There is moderate calcification of the  aortic valve. There is mild thickening of the aortic valve. Aortic valve  regurgitation is trivial. Aortic valve sclerosis/calcification is present,  without any evidence of aortic  stenosis.   5. The inferior vena cava is normal in size with greater than 50%  respiratory variability, suggesting right atrial pressure of 3 mmHg.   Patient Profile  72 year old female with history of ESRD on hemodialysis, MRSA bacteremia with prior infectious endocarditis, chronic discitis, paroxysmal atrial fibrillation, hypertension who was admitted to the hospital with discitis. Cardiology consulted for atrial fibrillation with RVR.  Assessment & Plan   # Paroxysmal atrial fibrillation -Known history of this.  Has converted back to sinus rhythm on amiodarone.  Will  continue this.  Plan for 200 mg twice  daily for 7 days and then 200 mg daily. -She always developed A-fib with recurrent sepsis in the setting of chronic osteomyelitis of the lumbar spine.  This is likely a good option for her. -Eliquis 2.5 mg twice daily. -Continue metoprolol tartrate 50 mg twice daily as well.  # MRSA bacteremia # Recurrent osteomyelitis discitis of the lumbar spine # Prior infective endocarditis -Admitted with recurrent bacteremia.  Has a known history of right atrial endocarditis.  She is now back with recurrent MRSA bacteremia.  The daughter and son are not wanting her to have a transesophageal echo.  Her transthoracic echo is unremarkable for any significant vegetation or significant regurgitation or evidence of destruction.  To me she is on lifelong antibiotics for chronic osteomyelitis it is really unclear how a transesophageal echo would change management. -For now would recommend discontinue antibiotic therapy.  I see limited need for transesophageal echo.  # Metabolic encephalopathy -Secondary to sepsis  # ESRD on hemodialysis -Per nephrology  [place] will sign off.   Medication Recommendations: Amiodarone as above.  Eliquis and metoprolol. Other recommendations (labs, testing, etc): None Follow up as an outpatient: We will arrange outpatient follow-up in 4 to 6 weeks with [name].   For questions or updates, please contact [place] Please consult [email or web address] for contact info under        Signed, [place] Bells T. Audie Box, MD, [place]  01/21/2023 8:54 AM

## 2023-01-21 NOTE — Progress Notes (Addendum)
PROGRESS NOTE  Diamond [name] [id number] DOB: 10/26/1951 DOA: 01/15/2023 PCP: [facility], [place]  Brief History   Diamond [name] is an 72 y.o. 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  MRI of the lumbar spine showed persistent discitis L2-L3 and L4-5 with inflammatory changes compared to previous MRI superimposed,slightly worsened compared to previous MRI.  CRP is 33 ESR is 133.  TEE  had been scheduled for 01/11/2024. However cardiology does not feel that TEE is necessary as the [name] will need to be on life-long antibiotics anyway.   The [name] is receiving IV Vancomycin.  Consultants  Nephrology Cardiology Anesthesiology  Procedures  None  Antibiotics   Anti-infectives (From admission, onward)    Start     Dose/Rate Route Frequency Ordered Stop   01/17/23 1200  vancomycin (VANCOREADY) IVPB 500 mg/100 mL        500 mg 100 mL/hr over 60 Minutes Intravenous Every M-W-F (Hemodialysis) 01/16/23 0056     01/15/23 2300  vancomycin (VANCOREADY) IVPB 1250 mg/250 mL        1,250 mg 166.7 mL/hr over 90 Minutes Intravenous  Once 01/15/23 2258 01/16/23 0139        Subjective  The [name] is awake and alert. No acute distress.  Objective   Vitals:  Vitals:   01/20/23 1221 01/20/23 1643  BP: (!) 146/91 (!) 143/94  Pulse: (!) 101 (!) 122  Resp: 16 16  Temp: 97.7 F (36.5 C) 97.7 F (36.5 C)  SpO2: 100% 100%    Exam:  Constitutional:  Appears calm and comfortable Respiratory:  CTA bilaterally, no w/r/r.  Respiratory effort normal. No retractions or accessory muscle use Cardiovascular:  RRR, no m/r/g No LE extremity edema   Normal pedal pulses Abdomen:  Abdomen appears normal; no  tenderness or masses No hernias No HSM Musculoskeletal:  Digits/nails BUE: no clubbing, cyanosis, petechiae, infection exam of joints, bones, muscles of at least one of following: head/neck, RUE, LUE, RLE, LLE   strength and tone normal, no atrophy, no abnormal movements No tenderness, masses Normal ROM, no contractures  gait and station Skin:  No rashes, lesions, ulcers palpation of skin: no induration or nodules Neurologic:  CN 2-12 intact Sensation all 4 extremities intact Psychiatric:  Mental status Mood, affect appropriate Orientation to person, place,  I have personally reviewed the following:   Today's Data   Vitals:   01/20/23 1221 01/20/23 1643  BP: (!) 146/91 (!) 143/94  Pulse: (!) 101 (!) 122  Resp: 16 16  Temp: 97.7 F (36.5 C) 97.7 F (36.5 C)  SpO2: 100% 100%     Lab Data  CBC    Component Value Date/Time   WBC 7.1 01/21/2023 0238   RBC 2.95 (L) 01/21/2023 0238   HGB 9.6 (L) 01/21/2023 0238   HCT 28.5 (L) 01/21/2023 0238   PLT 414 (H) 01/21/2023 0238   MCV 96.6 01/21/2023 0238   MCH 32.5 01/21/2023 0238   MCHC 33.7 01/21/2023 0238   RDW 20.7 (H) 01/21/2023 0238   LYMPHSABS 0.9 01/21/2023 0238   MONOABS 0.4 01/21/2023 0238   EOSABS 0.1 01/21/2023 0238   BASOSABS 0.0 01/21/2023 0238      Latest Ref Rng & Units 01/21/2023    2:38 AM 01/19/2023    9:49 AM 01/17/2023  3:24 PM  BMP  Glucose 70 - 99 mg/dL 107  83  126   BUN 8 - 23 mg/dL 37  54  76   Creatinine 0.44 - 1.00 mg/dL 5.73  7.49  10.98   Sodium 135 - 145 mmol/L 134  131  135   Potassium 3.5 - 5.1 mmol/L 4.3  4.2  4.2   Chloride 98 - 111 mmol/L 94  88  93   CO2 22 - 32 mmol/L 24  24  23   $ Calcium 8.9 - 10.3 mg/dL 9.6  9.8  9.1      Micro Data   Results for orders placed or performed during the hospital encounter of 01/15/23  Blood culture (routine x 2)     Status: Abnormal   Collection Time: 01/15/23  2:25 PM   Specimen: BLOOD  Result Value Ref Range Status   Specimen  Description BLOOD SITE NOT SPECIFIED  Final   Special Requests   Final    BOTTLES DRAWN AEROBIC AND ANAEROBIC Blood Culture adequate volume   Culture  Setup Time   Final    ANAEROBIC BOTTLE ONLY GRAM POSITIVE COCCI IN CLUSTERS Organism ID to follow CRITICAL RESULT CALLED TO, READ BACK BY AND VERIFIED WITH:  C/ PHARMD C. PIERCE 01/16/23 1620 A. LAFRANCE Performed at [facility], [place] [address] [place], [place] [address]    Culture METHICILLIN RESISTANT STAPHYLOCOCCUS AUREUS (A)  Final   Report Status 01/18/2023 FINAL  Final   Organism ID, Bacteria METHICILLIN RESISTANT STAPHYLOCOCCUS AUREUS  Final      Susceptibility   Methicillin resistant staphylococcus aureus - MIC*    CIPROFLOXACIN <=0.5 SENSITIVE Sensitive     ERYTHROMYCIN <=0.25 SENSITIVE Sensitive     GENTAMICIN <=0.5 SENSITIVE Sensitive     OXACILLIN >=4 RESISTANT Resistant     TETRACYCLINE >=16 RESISTANT Resistant     VANCOMYCIN 1 SENSITIVE Sensitive     TRIMETH/SULFA <=10 SENSITIVE Sensitive     CLINDAMYCIN <=0.25 SENSITIVE Sensitive     RIFAMPIN <=0.5 SENSITIVE Sensitive     Inducible Clindamycin NEGATIVE Sensitive     * METHICILLIN RESISTANT STAPHYLOCOCCUS AUREUS  Blood Culture ID Panel (Reflexed)     Status: Abnormal   Collection Time: 01/15/23  2:25 PM  Result Value Ref Range Status   Enterococcus faecalis NOT DETECTED NOT DETECTED Final   Enterococcus Faecium NOT DETECTED NOT DETECTED Final   Listeria monocytogenes NOT DETECTED NOT DETECTED Final   Staphylococcus species DETECTED (A) NOT DETECTED Final    Comment: CRITICAL RESULT CALLED TO, READ BACK BY AND VERIFIED WITH:  C/ PHARMD C. PIERCE 01/16/23 1620 A. LAFRANCE    Staphylococcus aureus (BCID) DETECTED (A) NOT DETECTED Final    Comment: Methicillin (oxacillin)-resistant Staphylococcus aureus (MRSA). MRSA is predictably resistant to beta-lactam antibiotics (except ceftaroline). Preferred therapy is vancomycin unless clinically contraindicated. [name]  requires contact precautions if  hospitalized. CRITICAL RESULT CALLED TO, READ BACK BY AND VERIFIED WITH:  C/ PHARMD C. PIERCE 01/16/23 1620 A. LAFRANCE    Staphylococcus epidermidis NOT DETECTED NOT DETECTED Final   Staphylococcus lugdunensis NOT DETECTED NOT DETECTED Final   Streptococcus species NOT DETECTED NOT DETECTED Final   Streptococcus agalactiae NOT DETECTED NOT DETECTED Final   Streptococcus pneumoniae NOT DETECTED NOT DETECTED Final   Streptococcus pyogenes NOT DETECTED NOT DETECTED Final   A.calcoaceticus-baumannii NOT DETECTED NOT DETECTED Final   Bacteroides fragilis NOT DETECTED NOT DETECTED Final   Enterobacterales NOT DETECTED NOT DETECTED Final   Enterobacter cloacae  complex NOT DETECTED NOT DETECTED Final   Escherichia coli NOT DETECTED NOT DETECTED Final   Klebsiella aerogenes NOT DETECTED NOT DETECTED Final   Klebsiella oxytoca NOT DETECTED NOT DETECTED Final   Klebsiella pneumoniae NOT DETECTED NOT DETECTED Final   Proteus species NOT DETECTED NOT DETECTED Final   Salmonella species NOT DETECTED NOT DETECTED Final   Serratia marcescens NOT DETECTED NOT DETECTED Final   Haemophilus influenzae NOT DETECTED NOT DETECTED Final   Neisseria meningitidis NOT DETECTED NOT DETECTED Final   Pseudomonas aeruginosa NOT DETECTED NOT DETECTED Final   Stenotrophomonas maltophilia NOT DETECTED NOT DETECTED Final   Candida albicans NOT DETECTED NOT DETECTED Final   Candida auris NOT DETECTED NOT DETECTED Final   Candida glabrata NOT DETECTED NOT DETECTED Final   Candida krusei NOT DETECTED NOT DETECTED Final   Candida parapsilosis NOT DETECTED NOT DETECTED Final   Candida tropicalis NOT DETECTED NOT DETECTED Final   Cryptococcus neoformans/gattii NOT DETECTED NOT DETECTED Final   Meth resistant mecA/C and MREJ DETECTED (A) NOT DETECTED Final    Comment: CRITICAL RESULT CALLED TO, READ BACK BY AND VERIFIED WITH:  C/ PHARMD C. PIERCE 01/16/23 1620 A. LAFRANCE Performed at  [facility], [place] [address] [place], [place] [address]   Culture, blood (Routine X 2) w Reflex to ID Panel     Status: None (Preliminary result)   Collection Time: 01/16/23  5:59 PM   Specimen: BLOOD RIGHT ARM  Result Value Ref Range Status   Specimen Description BLOOD RIGHT ARM  Final   Special Requests   Final    BOTTLES DRAWN AEROBIC AND ANAEROBIC Blood Culture results may not be optimal due to an inadequate volume of blood received in culture bottles   Culture   Final    NO GROWTH 4 DAYS Performed at [facility], [place] [address], [place], [place] [address]    Report Status PENDING  Incomplete  Culture, blood (Routine X 2) w Reflex to ID Panel     Status: None (Preliminary result)   Collection Time: 01/16/23  5:59 PM   Specimen: BLOOD RIGHT ARM  Result Value Ref Range Status   Specimen Description BLOOD RIGHT ARM  Final   Special Requests   Final    BOTTLES DRAWN AEROBIC AND ANAEROBIC Blood Culture results may not be optimal due to an inadequate volume of blood received in culture bottles   Culture   Final    NO GROWTH 4 DAYS Performed at [facility], [place] [address] [place],  [address]    Report Status PENDING  Incomplete    Scheduled Meds:  amiodarone  200 mg Oral BID   Followed by   Derrill Memo ON 01/27/2023] amiodarone  200 mg Oral Daily   apixaban  2.5 mg Oral Q12H   Chlorhexidine Gluconate Cloth  6 each Topical Q0600   metoprolol tartrate  50 mg Oral Q6H   pantoprazole  40 mg Oral Daily   sevelamer carbonate  1,600 mg Oral TID WC   Continuous Infusions:  vancomycin Stopped (01/19/23 1204)    Principal Problem:   Acute on chronic osteomyelitis ([place]) Active Problems:   Acute metabolic encephalopathy   ESRD on hemodialysis ([place])   Paroxysmal atrial flutter (H[facility])   Hypertension   Discitis of lumbar region   Metabolic acidosis   LOS: 4 days   A & P  Paroxysmal atrial flutter ([facility]) -continue Eliquis -Rate is controlled on metoprilol  sucinate 50 mg daily.  This was not initiallycontinued as inpatient due to the [name]'s bradycardia on presentation. Will continue metoprolol tartrate at 25 mg bid for now due to HR in the 110's despite IV amiodarone. The [name] is also somewhat hypertensive. - Monitor on telemetry.   Hypertension -Metoprolol will be resumed as 25 mg tartrate bid instead of the 50 mg succinate she was taking at home due to the low heart rates recorded upon arrival. Monitor on telemetry. The [name] is somewhat hypertensive currently.  Acute on chronic osteomyelitis (HCC) -hx of recurrent MRSA bacteremia with lumbar discitis/osteomyelitis on approximately 4-5 months of antibiotics here with worsening discitis at L2-3 and L4-5 as well developing phlegmon at L3 and left psoas muscle. ESR and CPR both elevated. -follows with ID Dr. Gale Journey outpatient and last on Bactrim on 01/05/2023.  -blood cultures are pending -infectious disease Dr. Tommy Medal recommends placing her on IV Vancomycin and will see in consultation.   ESRD on hemodialysis ([place]) -Pt tells me her days are MWF but per documentation it is Tues/Thurs/Sat. Cannot recall her last dialysis session. Creatinine is elevated to 8.3 with previous around 4-6 suggestive that she may have missed a session. Nephrology has been consulted. The [name] has been continued on her MWF dialysis schedule.   Acute metabolic encephalopathy Due to acute infection and missed dialysis. Leel of alertness appears to be improved, however the [name] is still not able to consent for TEE. The daughter Diamond [name] or son Diamond [name] will consent for that procedure.  Metabolic acidosis -secondary to worsening renal function. Elevated creatinine suggestive of missed dialysis session. Has elevated anion gap at 17 and appears dehydration on exam. Will give small volume 500ccc LR bolus fluid.   Discitis of lumbar region Levels 2-3 and 4-5.  The [name] is receiving vancomycin.   DVT prophylaxis:  Eliquis Code Status: Full Code Family Communication: None Disposition Plan: tbd    Tanith Dagostino, DO [facility] Direct contact: see [email or web address]  7PM-7AM contact night coverage as above 01/21/2023, 4:50 PM  LOS: 4 days

## 2023-01-21 NOTE — Progress Notes (Signed)
[facility] Infectious Diseases Follow Up Note  Patient Identification: Patient Name: Diamond [name] MRN: [id number] [place] Date: 01/15/2023 12:11 PM Age: 72 y.o.Today's Date: 01/21/2023  Reason for Visit: Fu on MRSA bacteremia   Principal Problem:   Acute on chronic osteomyelitis [facility]) Active Problems:   Hypertension   ESRD on hemodialysis ([place])   Discitis of lumbar region   Paroxysmal atrial flutter ([place])   Acute metabolic encephalopathy   Metabolic acidosis   Antibiotics: Vancomycin with HD 2/10-c  Lines/Hardwares: Left arm AVF   Interval Events: continues to be afebrile, No TEE per Cardiology  Assessment # Recurrent MRSA bacteremia ( Jan 2023, July 2023, October 2023) 2/10 blood culture -MRSA 2/11 blood culture 2 x 2 sets -no growth in 3 days 2/12 TTE mild thickening of the aortic valve;  previously noted small mobile density during 0.5 x 0.9 cm in the RVOT seen on TTE 08/16/2022 not seen in subsequent TTE.  Of note, TEE 12/22/21 2.1 x 1.37 multilobulated mobile mass attached to the RA/IVC junction which has the appearance of thrombus/vegetation in the setting of bacteremia and recently remove dialysis catheter.   # Persistent osteomyelitis/discitis at L2-L3 and L4-L5 with slight interval worsening and paravertebral inflammatory changes with superimposed phlegmom 2.3 cm heterogeneous area involving the left psoas muscle at the level of L3  -No drainable fluid collection in MRI L-spine 2/10   # Paroxysmal A fib - Cardiology following # ESRD om HD via left arm aVF - no signs of infection at aVF.  Evaluated by VVS with no concerns  # Chronic Hepatitis C - HCV RNA 941000 in 07/21/22, hep B surface ag NR in 01/17/23, HIV NR 10/2019, Liver enzymes 2/10 wnl   Recommendations Continue vancomycin, with HD, plan for 8 weeks from 2/11 Repeat MRI L spine to fu on epidural phlegmon, ordered for 2/17 Monitor CBC, BMP and Vancomycin  trough  Fu in ID clinic arranged HCV management OP  Dr Baxter Flattery covering this weekend.   Rest of the management as per the primary team. Thank you for the consult. Please page with pertinent questions or concerns.  ______________________________________________________________________ Subjective patient seen and examined at the bedside. Complains of back pain. Per RN, She is oriented *1 and follows simple commands    Vitals BP (!) 157/84 (BP Location: Right Arm)   Pulse 68   Temp (!) 97.5 F (36.4 C) (Axillary)   Resp 16   Ht 5' 6"$  (1.676 m)   Wt 53 kg   SpO2 99%   BMI 18.86 kg/m      Physical Exam Constitutional:  elderly think black lady lying in the bed, appears comfortable     Comments:   Cardiovascular:     Rate and Rhythm: Normal rate and Irregular rhythm.     Heart sounds: murmur +   Pulmonary:     Effort: Pulmonary effort is normal on room air     Comments: Normal breath sounds   Abdominal:     Palpations: Abdomen is soft.     Tenderness: non distended and non tender   Musculoskeletal:        General: No swelling or tenderness in peripheral joints.  Skin:    Comments: No obvious rashes, left AVF with no swelling/redness/tenderness or fluctuance   Neurological:     General: grossly non focal, awake, oriented *1, follows some basic commands   Pertinent Microbiology Results for orders placed or performed during the hospital encounter of 01/15/23  Blood culture (routine x  2)     Status: Abnormal   Collection Time: 01/15/23  2:25 PM   Specimen: BLOOD  Result Value Ref Range Status   Specimen Description BLOOD SITE NOT SPECIFIED  Final   Special Requests   Final    BOTTLES DRAWN AEROBIC AND ANAEROBIC Blood Culture adequate volume   Culture  Setup Time   Final    ANAEROBIC BOTTLE ONLY GRAM POSITIVE COCCI IN CLUSTERS Organism ID to follow CRITICAL RESULT CALLED TO, READ BACK BY AND VERIFIED WITH:  C/ PHARMD C. PIERCE 01/16/23 1620 A. LAFRANCE Performed  at [facility], [place] [address], [place], [place] [address]    Culture METHICILLIN RESISTANT STAPHYLOCOCCUS AUREUS (A)  Final   Report Status 01/18/2023 FINAL  Final   Organism ID, Bacteria METHICILLIN RESISTANT STAPHYLOCOCCUS AUREUS  Final      Susceptibility   Methicillin resistant staphylococcus aureus - MIC*    CIPROFLOXACIN <=0.5 SENSITIVE Sensitive     ERYTHROMYCIN <=0.25 SENSITIVE Sensitive     GENTAMICIN <=0.5 SENSITIVE Sensitive     OXACILLIN >=4 RESISTANT Resistant     TETRACYCLINE >=16 RESISTANT Resistant     VANCOMYCIN 1 SENSITIVE Sensitive     TRIMETH/SULFA <=10 SENSITIVE Sensitive     CLINDAMYCIN <=0.25 SENSITIVE Sensitive     RIFAMPIN <=0.5 SENSITIVE Sensitive     Inducible Clindamycin NEGATIVE Sensitive     * METHICILLIN RESISTANT STAPHYLOCOCCUS AUREUS  Blood Culture ID Panel (Reflexed)     Status: Abnormal   Collection Time: 01/15/23  2:25 PM  Result Value Ref Range Status   Enterococcus faecalis NOT DETECTED NOT DETECTED Final   Enterococcus Faecium NOT DETECTED NOT DETECTED Final   Listeria monocytogenes NOT DETECTED NOT DETECTED Final   Staphylococcus species DETECTED (A) NOT DETECTED Final    Comment: CRITICAL RESULT CALLED TO, READ BACK BY AND VERIFIED WITH:  C/ PHARMD C. PIERCE 01/16/23 1620 A. LAFRANCE    Staphylococcus aureus (BCID) DETECTED (A) NOT DETECTED Final    Comment: Methicillin (oxacillin)-resistant Staphylococcus aureus (MRSA). MRSA is predictably resistant to beta-lactam antibiotics (except ceftaroline). Preferred therapy is vancomycin unless clinically contraindicated. Patient requires contact precautions if  hospitalized. CRITICAL RESULT CALLED TO, READ BACK BY AND VERIFIED WITH:  C/ PHARMD C. PIERCE 01/16/23 1620 A. LAFRANCE    Staphylococcus epidermidis NOT DETECTED NOT DETECTED Final   Staphylococcus lugdunensis NOT DETECTED NOT DETECTED Final   Streptococcus species NOT DETECTED NOT DETECTED Final   Streptococcus agalactiae NOT  DETECTED NOT DETECTED Final   Streptococcus pneumoniae NOT DETECTED NOT DETECTED Final   Streptococcus pyogenes NOT DETECTED NOT DETECTED Final   A.calcoaceticus-baumannii NOT DETECTED NOT DETECTED Final   Bacteroides fragilis NOT DETECTED NOT DETECTED Final   Enterobacterales NOT DETECTED NOT DETECTED Final   Enterobacter cloacae complex NOT DETECTED NOT DETECTED Final   Escherichia coli NOT DETECTED NOT DETECTED Final   Klebsiella aerogenes NOT DETECTED NOT DETECTED Final   Klebsiella oxytoca NOT DETECTED NOT DETECTED Final   Klebsiella pneumoniae NOT DETECTED NOT DETECTED Final   Proteus species NOT DETECTED NOT DETECTED Final   Salmonella species NOT DETECTED NOT DETECTED Final   Serratia marcescens NOT DETECTED NOT DETECTED Final   Haemophilus influenzae NOT DETECTED NOT DETECTED Final   Neisseria meningitidis NOT DETECTED NOT DETECTED Final   Pseudomonas aeruginosa NOT DETECTED NOT DETECTED Final   Stenotrophomonas maltophilia NOT DETECTED NOT DETECTED Final   Candida albicans NOT DETECTED NOT DETECTED Final   Candida auris NOT DETECTED NOT DETECTED Final  Candida glabrata NOT DETECTED NOT DETECTED Final   Candida krusei NOT DETECTED NOT DETECTED Final   Candida parapsilosis NOT DETECTED NOT DETECTED Final   Candida tropicalis NOT DETECTED NOT DETECTED Final   Cryptococcus neoformans/gattii NOT DETECTED NOT DETECTED Final   Meth resistant mecA/C and MREJ DETECTED (A) NOT DETECTED Final    Comment: CRITICAL RESULT CALLED TO, READ BACK BY AND VERIFIED WITH:  C/ PHARMD C. PIERCE 01/16/23 1620 A. LAFRANCE Performed at [facility], [place] [address], [place], [place] [address]   Culture, blood (Routine X 2) w Reflex to ID Panel     Status: None   Collection Time: 01/16/23  5:59 PM   Specimen: BLOOD RIGHT ARM  Result Value Ref Range Status   Specimen Description BLOOD RIGHT ARM  Final   Special Requests   Final    BOTTLES DRAWN AEROBIC AND ANAEROBIC Blood Culture results may  not be optimal due to an inadequate volume of blood received in culture bottles   Culture   Final    NO GROWTH 5 DAYS Performed at [facility], [place] [address] [place], [place] [address]    Report Status 01/21/2023 FINAL  Final  Culture, blood (Routine X 2) w Reflex to ID Panel     Status: None   Collection Time: 01/16/23  5:59 PM   Specimen: BLOOD RIGHT ARM  Result Value Ref Range Status   Specimen Description BLOOD RIGHT ARM  Final   Special Requests   Final    BOTTLES DRAWN AEROBIC AND ANAEROBIC Blood Culture results may not be optimal due to an inadequate volume of blood received in culture bottles   Culture   Final    NO GROWTH 5 DAYS Performed at [facility], [place] [address], [place], [place] [address]    Report Status 01/21/2023 FINAL  Final    Pertinent Lab.    Latest Ref Rng & Units 01/21/2023    2:38 AM 01/19/2023    9:49 AM 01/17/2023    3:24 PM  CBC  WBC 4.0 - 10.5 K/uL 7.1  8.2  8.8   Hemoglobin 12.0 - 15.0 g/dL 9.6  9.7  9.6   Hematocrit 36.0 - 46.0 % 28.5  28.9  27.9   Platelets 150 - 400 K/uL 414  401  329       Latest Ref Rng & Units 01/21/2023    2:38 AM 01/19/2023    9:49 AM 01/17/2023    3:24 PM  CMP  Glucose 70 - 99 mg/dL 107  83  126   BUN 8 - 23 mg/dL 37  54  76   Creatinine 0.44 - 1.00 mg/dL 5.73  7.49  10.98   Sodium 135 - 145 mmol/L 134  131  135   Potassium 3.5 - 5.1 mmol/L 4.3  4.2  4.2   Chloride 98 - 111 mmol/L 94  88  93   CO2 22 - 32 mmol/L 24  24  23   $ Calcium 8.9 - 10.3 mg/dL 9.6  9.8  9.1      Pertinent Imaging today Plain films and CT images have been personally visualized and interpreted; radiology reports have been reviewed. Decision making incorporated into the Impression / Recommendations.  No results found.  I spent 55  minutes for this patient encounter including review of prior medical records, coordination of care with primary/other specialist with greater than 50% of time being face to face/counseling and  discussing diagnostics/treatment plan  with the patient/family.  Electronically signed by:   Rosiland Oz, MD Infectious Disease Physician [facility] for Infectious Disease Pager: [phone]

## 2023-01-21 NOTE — TOC Progression Note (Signed)
Transition of Care [facility]) - Progression Note    Patient Details  Name: Diamond [name] MRN: [id number] Date of Birth: 07/14/1951  Transition of Care [facility]) CM/SW [place], [place] Phone Number: 01/21/2023, 2:07 PM  Clinical Narrative:     CSW spoke with Tonye Becket- she advised, if stable, patient would not be able to admit over the weekend. She will have to admit on Monday.  CSW spoke with [facility] SW Linus Orn- provided update.   CSW spoke with the patient's daughter,[name]. She expressed concerns regarding communication, stating no one has been updated about the outcome of the patient's procedure on yesterday??.   CSW informed patient declined dialysis treatment. She states herself and her siblings are involved but they work, therefore, it can be challenging to be there during the day. CSW encouraged family to contact RN & to get  update on progress of the patient. Including, assisting and encouraging the patient in getting the needed care.   Expected Discharge Plan: [place] Barriers to Discharge: Continued Medical Work up, SNF Pending bed offer  Expected Discharge Plan and Services       Living arrangements for the past 2 months: Single Family Home                                       Social Determinants of Health (SDOH) Interventions SDOH Screenings   Food Insecurity: No Food Insecurity (11/30/2022)  Housing: Low Risk  (11/30/2022)  Transportation Needs: No Transportation Needs (11/30/2022)  Utilities: Not At Risk (11/30/2022)  Depression (PHQ2-9): Low Risk  (01/05/2023)  Social Connections: Moderately Integrated (07/20/2022)  Tobacco Use: Low Risk  (01/19/2023)    Readmission Risk Interventions    06/30/2022    1:12 PM 06/17/2022    1:02 PM 01/22/2022   12:19 PM  Readmission Risk Prevention Plan  Transportation Screening Complete Complete Complete  Medication Review Press photographer) Complete Complete Complete  PCP or  Specialist appointment within 3-5 days of discharge Complete Complete Complete  HRI or Home Care Consult Complete Complete Complete  SW Recovery Care/Counseling Consult Complete Complete Complete  Palliative Care Screening Not Applicable Not Applicable [place] Not Applicable Complete Not Applicable

## 2023-01-22 DIAGNOSIS — M8660 Other chronic osteomyelitis, unspecified site: Secondary | ICD-10-CM | POA: Diagnosis not present

## 2023-01-22 DIAGNOSIS — M8610 Other acute osteomyelitis, unspecified site: Secondary | ICD-10-CM | POA: Diagnosis not present

## 2023-01-22 LAB — RENAL FUNCTION PANEL
Albumin: 2.2 g/dL — ABNORMAL LOW (ref 3.5–5.0)
Anion gap: 15 (ref 5–15)
BUN: 53 mg/dL — ABNORMAL HIGH (ref 8–23)
CO2: 24 mmol/L (ref 22–32)
Calcium: 9.2 mg/dL (ref 8.9–10.3)
Chloride: 95 mmol/L — ABNORMAL LOW (ref 98–111)
Creatinine, Ser: 7.52 mg/dL — ABNORMAL HIGH (ref 0.44–1.00)
GFR, Estimated: 5 mL/min — ABNORMAL LOW (ref >60)
Glucose, Bld: 91 mg/dL (ref 70–99)
Phosphorus: 6.6 mg/dL — ABNORMAL HIGH (ref 2.5–4.6)
Potassium: 4.7 mmol/L (ref 3.5–5.1)
Sodium: 134 mmol/L — ABNORMAL LOW (ref 135–145)

## 2023-01-22 LAB — CBC
HCT: 26 % — ABNORMAL LOW (ref 36.0–46.0)
Hemoglobin: 9.3 g/dL — ABNORMAL LOW (ref 12.0–15.0)
MCH: 34.6 pg — ABNORMAL HIGH (ref 26.0–34.0)
MCHC: 35.8 g/dL (ref 30.0–36.0)
MCV: 96.7 fL (ref 80.0–100.0)
Platelets: 336 10*3/uL (ref 150–400)
RBC: 2.69 MIL/uL — ABNORMAL LOW (ref 3.87–5.11)
RDW: 20.5 % — ABNORMAL HIGH (ref 11.5–15.5)
WBC: 7.3 10*3/uL (ref 4.0–10.5)
nRBC: 0 % (ref 0.0–0.2)

## 2023-01-22 MED ORDER — ONDANSETRON HCL 4 MG/2ML IJ SOLN
4.0000 mg | Freq: Once | INTRAMUSCULAR | Status: AC
  Administered 2023-01-22: 4 mg via INTRAVENOUS
  Filled 2023-01-22: qty 2

## 2023-01-22 MED ORDER — VANCOMYCIN HCL 500 MG/100ML IV SOLN
500.0000 mg | INTRAVENOUS | Status: AC
  Administered 2023-01-22: 500 mg via INTRAVENOUS

## 2023-01-22 MED ORDER — PROSOURCE PLUS PO LIQD
30.0000 mL | Freq: Two times a day (BID) | ORAL | Status: DC
  Administered 2023-01-22 – 2023-01-25 (×4): 30 mL via ORAL
  Filled 2023-01-22 (×3): qty 30

## 2023-01-22 NOTE — Progress Notes (Signed)
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  MRI lumbar spine showed persistent findings concerning for osteomyelitis and discitis at L2 and 3, L4 and 5 with slight interval worsening of paravertebral inflammatory changes as compared to previous MRI.  2.3 cm heterogeneous area of involving left psoas muscle at the level of L3 likely phlegmon.  No drainable abscess.  Admitted with ID consultation.   Assessment & Plan:   Recurrent MRSA bacteremia, 12/2021, 06/2022, 09/2022. 2/10, blood cultures with MRSA. 2/11, blood cultures negative so far. 2/12, TTE with thickened aortic valve. 1/17, 2.1 x 1.37 multilobulated mobile mass attached to the RA/IVC junction. Persistent osteomyelitis discitis with left psoas muscle phlegmon of 2.3 cm from MRI on 2/10.  Repeat cultures cleared.  Remains on vancomycin with hemodialysis.  Plan to continue vancomycin for 8 weeks. Repeat MRI lumbar spine is scheduled today. ID is following. Patient had plans for TEE, high risk of decompensation on anesthesia.  TEE was not pursued after discussing with family as it is not going to change any management, apparently patient should be on suppressive antibiotic therapy for lifelong.  Paroxysmal atrial flutter: Now rate controlled on amiodarone 200 mg twice daily, metoprolol 50 mg twice daily.  She is therapeutic on Eliquis.  ESRD on hemodialysis: Getting dialysis in the hospital.  Declined dialysis yesterday.  Acute metabolic infective encephalopathy: As above.  Nonfocal.  Remains in poor clinical status.  Goal of  care: Patient is lethargic, she is not able to keep up conversation to discuss any goal of care.  Called and updated patient's daughter [name] on the phone.  Patient remains in poor clinical status.  We discussed about ongoing difficulties getting rid of the infection, patient getting tired of infections and dialysis. [name] asked me "is our mom dying?".  I explained the situation including poor clinical status, poor outcome with severe medical condition and encouraged him to discuss goal of care and outcome etc. She was agreeable to meet with palliative care team as a way to start difficult conversation and to coordinate with all her siblings.  Will consult palliative care team.   DVT prophylaxis: apixaban (ELIQUIS) tablet 2.5 mg Start: 01/19/23 1800 apixaban (ELIQUIS) tablet 2.5 mg   Code Status: Full code Family Communication: Daughter [name] on the phone Disposition Plan: Status is: Inpatient Remains inpatient appropriate because: Severe medical disease, IV antibiotics, inpatient procedures     Consultants:  Nephrology Cardiology Palliative  Procedures:  None  Antimicrobials:  Vancomycin 2/10---   Subjective: Patient seen in the morning rounds.  Keeps eyes closed.  She answered simple questions but went back to sleep.  Does not keep up any conversation.  Objective: Vitals:   01/21/23 2015 01/21/23 2253 01/22/23 0345 01/22/23 0732  BP: (!) 158/94 (!) 149/98 (!) 158/91 (!) 163/94  Pulse: 83 83 82 75  Resp: 18 18 17 16  $ Temp: 98.8 F (37.1 C) 97.8 F (36.6 C) 98.5 F (36.9 C) 98.1 F (36.7 C)  TempSrc: Oral Oral Oral Oral  SpO2: 99% 100% 98% 99%  Weight:      Height:       No intake or output  data in the 24 hours ending 01/22/23 1143 Filed Weights   01/16/23 0145 01/17/23 1454 01/17/23 1814  Weight: 52.8 kg 53.4 kg 53 kg    Examination:  General exam: Appears calm and comfortable  Frail and debilitated.  Chronically sick looking. Respiratory  system: No added sounds. Cardiovascular system: S1 & S2 heard, RRR. No pedal edema. Gastrointestinal system: Soft.  Nontender.  Bowel sound present.   Central nervous system: Alert on stimulation.  Mostly sleepy.  Answers basic questions.  Does not engage in conversation. Extremities: Moves all extremities.  Left upper extremity AV fistula present.    Data Reviewed: I have personally reviewed following labs and imaging studies  CBC: Recent Labs  Lab 01/15/23 1218 01/16/23 0112 01/17/23 1524 01/19/23 0949 01/21/23 0238 01/22/23 1011  WBC 8.2 8.2 8.8 8.2 7.1 7.3  NEUTROABS 6.8  --   --   --  5.8  --   HGB 9.3* 9.6* 9.6* 9.7* 9.6* 9.3*  HCT 28.7* 29.0* 27.9* 28.9* 28.5* 26.0*  MCV 106.3* 102.8* 99.6 99.0 96.6 96.7  PLT 246 214 329 401* 414* [id number]   Basic Metabolic Panel: Recent Labs  Lab 01/16/23 0112 01/17/23 1524 01/19/23 0949 01/21/23 0238 01/22/23 1011  NA 135 135 131* 134* 134*  K 4.4 4.2 4.2 4.3 4.7  CL 92* 93* 88* 94* 95*  CO2 23 23 24 24 24  $ GLUCOSE 63* 126* 83 107* 91  BUN 54* 76* 54* 37* 53*  CREATININE 9.31* 10.98* 7.49* 5.73* 7.52*  CALCIUM 9.2 9.1 9.8 9.6 9.2  PHOS  --  8.3* 7.0*  --  6.6*   GFR: Estimated Creatinine Clearance: 5.6 mL/min (A) (by C-G formula based on SCr of 7.52 mg/dL (H)). Liver Function Tests: Recent Labs  Lab 01/15/23 1218 01/17/23 1524 01/19/23 0949 01/22/23 1011  AST 31  --   --   --   ALT 6  --   --   --   ALKPHOS 52  --   --   --   BILITOT 1.0  --   --   --   PROT 7.0  --   --   --   ALBUMIN 2.6* 2.1* 2.2* 2.2*   No results for input(s): "LIPASE", "AMYLASE" in the last 168 hours. No results for input(s): "AMMONIA" in the last 168 hours. Coagulation Profile: No results for input(s): "INR", "PROTIME" in the last 168 hours. Cardiac Enzymes: No results for input(s): "CKTOTAL", "CKMB", "CKMBINDEX", "TROPONINI" in the last 168 hours. BNP (last 3 results) No results for input(s): "PROBNP" in the last 8760 hours. HbA1C: No  results for input(s): "HGBA1C" in the last 72 hours. CBG: No results for input(s): "GLUCAP" in the last 168 hours. Lipid Profile: No results for input(s): "CHOL", "HDL", "LDLCALC", "TRIG", "CHOLHDL", "LDLDIRECT" in the last 72 hours. Thyroid Function Tests: No results for input(s): "TSH", "T4TOTAL", "FREET4", "T3FREE", "THYROIDAB" in the last 72 hours. Anemia Panel: No results for input(s): "VITAMINB12", "FOLATE", "FERRITIN", "TIBC", "IRON", "RETICCTPCT" in the last 72 hours. Sepsis Labs: Recent Labs  Lab 01/15/23 1219  LATICACIDVEN 1.9    Recent Results (from the past 240 hour(s))  Blood culture (routine x 2)     Status: Abnormal   Collection Time: 01/15/23  2:25 PM   Specimen: BLOOD  Result Value Ref Range Status   Specimen Description BLOOD SITE NOT SPECIFIED  Final   Special Requests   Final    BOTTLES DRAWN AEROBIC AND ANAEROBIC Blood Culture adequate volume   Culture  Setup  Time   Final    ANAEROBIC BOTTLE ONLY GRAM POSITIVE COCCI IN CLUSTERS Organism ID to follow CRITICAL RESULT CALLED TO, READ BACK BY AND VERIFIED WITH:  C/ PHARMD C. PIERCE 01/16/23 1620 A. LAFRANCE Performed at [facility], [place] [address] [place], [place] [address]    Culture METHICILLIN RESISTANT STAPHYLOCOCCUS AUREUS (A)  Final   Report Status 01/18/2023 FINAL  Final   Organism ID, Bacteria METHICILLIN RESISTANT STAPHYLOCOCCUS AUREUS  Final      Susceptibility   Methicillin resistant staphylococcus aureus - MIC*    CIPROFLOXACIN <=0.5 SENSITIVE Sensitive     ERYTHROMYCIN <=0.25 SENSITIVE Sensitive     GENTAMICIN <=0.5 SENSITIVE Sensitive     OXACILLIN >=4 RESISTANT Resistant     TETRACYCLINE >=16 RESISTANT Resistant     VANCOMYCIN 1 SENSITIVE Sensitive     TRIMETH/SULFA <=10 SENSITIVE Sensitive     CLINDAMYCIN <=0.25 SENSITIVE Sensitive     RIFAMPIN <=0.5 SENSITIVE Sensitive     Inducible Clindamycin NEGATIVE Sensitive     * METHICILLIN RESISTANT STAPHYLOCOCCUS AUREUS  Blood Culture  ID Panel (Reflexed)     Status: Abnormal   Collection Time: 01/15/23  2:25 PM  Result Value Ref Range Status   Enterococcus faecalis NOT DETECTED NOT DETECTED Final   Enterococcus Faecium NOT DETECTED NOT DETECTED Final   Listeria monocytogenes NOT DETECTED NOT DETECTED Final   Staphylococcus species DETECTED (A) NOT DETECTED Final    Comment: CRITICAL RESULT CALLED TO, READ BACK BY AND VERIFIED WITH:  C/ PHARMD C. PIERCE 01/16/23 1620 A. LAFRANCE    Staphylococcus aureus (BCID) DETECTED (A) NOT DETECTED Final    Comment: Methicillin (oxacillin)-resistant Staphylococcus aureus (MRSA). MRSA is predictably resistant to beta-lactam antibiotics (except ceftaroline). Preferred therapy is vancomycin unless clinically contraindicated. Patient requires contact precautions if  hospitalized. CRITICAL RESULT CALLED TO, READ BACK BY AND VERIFIED WITH:  C/ PHARMD C. PIERCE 01/16/23 1620 A. LAFRANCE    Staphylococcus epidermidis NOT DETECTED NOT DETECTED Final   Staphylococcus lugdunensis NOT DETECTED NOT DETECTED Final   Streptococcus species NOT DETECTED NOT DETECTED Final   Streptococcus agalactiae NOT DETECTED NOT DETECTED Final   Streptococcus pneumoniae NOT DETECTED NOT DETECTED Final   Streptococcus pyogenes NOT DETECTED NOT DETECTED Final   A.calcoaceticus-baumannii NOT DETECTED NOT DETECTED Final   Bacteroides fragilis NOT DETECTED NOT DETECTED Final   Enterobacterales NOT DETECTED NOT DETECTED Final   Enterobacter cloacae complex NOT DETECTED NOT DETECTED Final   Escherichia coli NOT DETECTED NOT DETECTED Final   Klebsiella aerogenes NOT DETECTED NOT DETECTED Final   Klebsiella oxytoca NOT DETECTED NOT DETECTED Final   Klebsiella pneumoniae NOT DETECTED NOT DETECTED Final   Proteus species NOT DETECTED NOT DETECTED Final   Salmonella species NOT DETECTED NOT DETECTED Final   Serratia marcescens NOT DETECTED NOT DETECTED Final   Haemophilus influenzae NOT DETECTED NOT DETECTED Final    Neisseria meningitidis NOT DETECTED NOT DETECTED Final   Pseudomonas aeruginosa NOT DETECTED NOT DETECTED Final   Stenotrophomonas maltophilia NOT DETECTED NOT DETECTED Final   Candida albicans NOT DETECTED NOT DETECTED Final   Candida auris NOT DETECTED NOT DETECTED Final   Candida glabrata NOT DETECTED NOT DETECTED Final   Candida krusei NOT DETECTED NOT DETECTED Final   Candida parapsilosis NOT DETECTED NOT DETECTED Final   Candida tropicalis NOT DETECTED NOT DETECTED Final   Cryptococcus neoformans/gattii NOT DETECTED NOT DETECTED Final   Meth resistant mecA/C and MREJ DETECTED (A) NOT DETECTED Final    Comment:  CRITICAL RESULT CALLED TO, READ BACK BY AND VERIFIED WITH:  C/ PHARMD C. PIERCE 01/16/23 1620 A. LAFRANCE Performed at [facility], [place] [address], [place], [place] [address]   Culture, blood (Routine X 2) w Reflex to ID Panel     Status: None   Collection Time: 01/16/23  5:59 PM   Specimen: BLOOD RIGHT ARM  Result Value Ref Range Status   Specimen Description BLOOD RIGHT ARM  Final   Special Requests   Final    BOTTLES DRAWN AEROBIC AND ANAEROBIC Blood Culture results may not be optimal due to an inadequate volume of blood received in culture bottles   Culture   Final    NO GROWTH 5 DAYS Performed at [facility], [place] [address] [place], [place] [address]    Report Status 01/21/2023 FINAL  Final  Culture, blood (Routine X 2) w Reflex to ID Panel     Status: None   Collection Time: 01/16/23  5:59 PM   Specimen: BLOOD RIGHT ARM  Result Value Ref Range Status   Specimen Description BLOOD RIGHT ARM  Final   Special Requests   Final    BOTTLES DRAWN AEROBIC AND ANAEROBIC Blood Culture results may not be optimal due to an inadequate volume of blood received in culture bottles   Culture   Final    NO GROWTH 5 DAYS Performed at [facility], [place] [address] [place], [place] [address]    Report Status 01/21/2023 FINAL  Final         Radiology  Studies: No results found.      Scheduled Meds:  (feeding supplement) PROSource Plus  30 mL Oral BID BM   amiodarone  200 mg Oral BID   Followed by   Derrill Memo ON 01/27/2023] amiodarone  200 mg Oral Daily   apixaban  2.5 mg Oral Q12H   Chlorhexidine Gluconate Cloth  6 each Topical Q0600   metoprolol tartrate  50 mg Oral BID   pantoprazole  40 mg Oral Daily   sevelamer carbonate  1,600 mg Oral TID WC   Continuous Infusions:  vancomycin Stopped (01/19/23 1204)     LOS: 6 days    Time spent: 35 minutes    Barb Merino, MD [facility] Pager [phone]

## 2023-01-22 NOTE — Progress Notes (Signed)
POST HD TX NOTE  01/22/23 1558  Vitals  Temp 98 F (36.7 C)  Temp Source Oral  BP (!) 165/100  MAP (mmHg) 119  BP Location Right Arm  BP Method Automatic  Patient Position (if appropriate) Lying  Pulse Rate 71  Pulse Rate Source Monitor  ECG Heart Rate 72  Resp 20  Oxygen Therapy  SpO2 98 %  O2 Device Room Air  Pulse Oximetry Type Continuous  During Treatment Monitoring  Intra-Hemodialysis Comments (S)   (post HD tx VS check)  Post Treatment  Dialyzer Clearance Lightly streaked  Duration of HD Treatment -hour(s) 2 hour(s) (2h 5 min)  Liters Processed 49.8  Fluid Removed (mL) 900 mL  Tolerated HD Treatment (S)  No (Comment) (pt complained the entrire time on tx, stated she was nauseated, nausea treated  but that didn't help to keep pt on tx)  Post-Hemodialysis Comments (S)  tx ended 55 min early d/t pt c/o not wanting to do it anymore. i educated pt of importance of completing her tx as she refused tx yesterday. also she had vanc to run the last hour of tx and i explained that to the pt and she stated to me, "i don't care, i don't want to do this anymore. i want to come off". i gave pt multiple opportunities to change her mind and stay on tx. i educated her multiple times.this was witnessed by other staff members in the unit at that time. each time she refused to stay on, so i finally ended her tx. once i d/c'd the tx and stripped the machine and had pulled her first needle and as i had just pulled her 2nd needle the pt stated that she changed her mind, she wanted to do her tx. i explained to the pt that it was too late at this point. i had already pulled her needles. i explained that i had given her multiple times to decide to stay on the tx and each time she refused.pt refused to sign AMA form. Uf goal not met, blood rinsed backMedication admin: Zofran 73mg IVP  AVG/AVF Arterial Site Held (minutes) 10 minutes  AVG/AVF Venous Site Held (minutes) 10 minutes  Fistula / Graft Left  Forearm Arteriovenous fistula  Placement Date/Time: (c) 12/23/21 1646   Placed prior to admission: No  Orientation: Left  Access Location: Forearm  Access Type: Arteriovenous fistula  Site Condition No complications  Fistula / Graft Assessment Bruit;Thrill;Present  Drainage Description None

## 2023-01-22 NOTE — Plan of Care (Signed)
  Problem: Clinical Measurements: Goal: Will remain free from infection Outcome: Progressing   Problem: Health Behavior/Discharge Planning: Goal: Ability to manage health-related needs will improve Outcome: Not Progressing   Problem: Activity: Goal: Risk for activity intolerance will decrease Outcome: Not Progressing   Problem: Nutrition: Goal: Adequate nutrition will be maintained Outcome: Not Progressing   Problem: Elimination: Goal: Will not experience complications related to urinary retention Outcome: Not Progressing

## 2023-01-22 NOTE — Progress Notes (Signed)
[facility] Progress Note   Subjective:   Seen in room - resting with eyes closed, although she will answer my questions. Denies dyspnea, + pain in back and legs. She refused HD yesterday - offered today instead, she didn't give answer.  Objective Vitals:   01/21/23 2015 01/21/23 2253 01/22/23 0345 01/22/23 0732  BP: (!) 158/94 (!) 149/98 (!) 158/91 (!) 163/94  Pulse: 83 83 82 75  Resp: 18 18 17 16  $ Temp: 98.8 F (37.1 C) 97.8 F (36.6 C) 98.5 F (36.9 C) 98.1 F (36.7 C)  TempSrc: Oral Oral Oral Oral  SpO2: 99% 100% 98% 99%  Weight:      Height:       Physical Exam General: Frail woman, NAD. Answers questions with eyes closed. Heart:RRR; 2/6 murmur Lungs: CTA anteriorly Abdomen: soft Extremities: no LE edema Dialysis Access: LUE AVF + bruit  Additional Objective Labs: Basic Metabolic Panel: Recent Labs  Lab 01/17/23 1524 01/19/23 0949 01/21/23 0238  NA 135 131* 134*  K 4.2 4.2 4.3  CL 93* 88* 94*  CO2 23 24 24  $ GLUCOSE 126* 83 107*  BUN 76* 54* 37*  CREATININE 10.98* 7.49* 5.73*  CALCIUM 9.1 9.8 9.6  PHOS 8.3* 7.0*  --    Liver Function Tests: Recent Labs  Lab 01/15/23 1218 01/17/23 1524 01/19/23 0949  AST 31  --   --   ALT 6  --   --   ALKPHOS 52  --   --   BILITOT 1.0  --   --   PROT 7.0  --   --   ALBUMIN 2.6* 2.1* 2.2*   CBC: Recent Labs  Lab 01/15/23 1218 01/16/23 0112 01/17/23 1524 01/19/23 0949 01/21/23 0238  WBC 8.2 8.2 8.8 8.2 7.1  NEUTROABS 6.8  --   --   --  5.8  HGB 9.3* 9.6* 9.6* 9.7* 9.6*  HCT 28.7* 29.0* 27.9* 28.9* 28.5*  MCV 106.3* 102.8* 99.6 99.0 96.6  PLT 246 214 329 401* 414*   Blood Culture    Component Value Date/Time   SDES BLOOD RIGHT ARM 01/16/2023 1759   SDES BLOOD RIGHT ARM 01/16/2023 1759   SPECREQUEST  01/16/2023 1759    BOTTLES DRAWN AEROBIC AND ANAEROBIC Blood Culture results may not be optimal due to an inadequate volume of blood received in culture bottles   SPECREQUEST  01/16/2023  1759    BOTTLES DRAWN AEROBIC AND ANAEROBIC Blood Culture results may not be optimal due to an inadequate volume of blood received in culture bottles   CULT  01/16/2023 1759    NO GROWTH 5 DAYS Performed at [facility], [place] [address] [place], [place] [address]    CULT  01/16/2023 1759    NO GROWTH 5 DAYS Performed at [facility], [place] [address] [place], [place] [address]    REPTSTATUS 01/21/2023 FINAL 01/16/2023 1759   REPTSTATUS 01/21/2023 FINAL 01/16/2023 1759   Medications:  vancomycin Stopped (01/19/23 1204)    amiodarone  200 mg Oral BID   Followed by   Derrill Memo ON 01/27/2023] amiodarone  200 mg Oral Daily   apixaban  2.5 mg Oral Q12H   Chlorhexidine Gluconate Cloth  6 each Topical Q0600   metoprolol tartrate  50 mg Oral BID   pantoprazole  40 mg Oral Daily   sevelamer carbonate  1,600 mg Oral TID WC    Dialysis Orders: MWF at [facility] 4hr, 400/600, EDW 52.5kg, 2K/2Ca, LUE AVF, 15g needles, no  heparin - Mircera 264mg IV q 2 weeks (last given 2/7) - no VDRA, binder is Renvela 2/meals.  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 TTE 2/12 with thickened aortic valve. Going for repeat LS MRI today. ESRD: Usual MWF schedule - refused HD yesterday, will offer today instead.  HTN/volume: BP high, no LE edema. UF as tolerated. Anemia of ESRD: Hgb 9.6 - will be due for ESA on 2/21 Secondary hyperparathyroidism: CorrCa high, no VDRA. Phos high - continue Renvela as binder for now. Nutrition: Alb low, adding supplements. A-fib: On amiodarone + Eliquis  KVeneta Penton PA-C 01/22/2023, 9:33 AM  [employer]

## 2023-01-22 NOTE — Progress Notes (Signed)
Patient off floor for treatment

## 2023-01-23 ENCOUNTER — Inpatient Hospital Stay (HOSPITAL_COMMUNITY): Payer: Medicare (Managed Care)

## 2023-01-23 DIAGNOSIS — M8610 Other acute osteomyelitis, unspecified site: Secondary | ICD-10-CM | POA: Diagnosis not present

## 2023-01-23 DIAGNOSIS — M8660 Other chronic osteomyelitis, unspecified site: Secondary | ICD-10-CM | POA: Diagnosis not present

## 2023-01-23 NOTE — Consult Note (Signed)
Consultation Note Date: 01/24/2023   Patient Name: Diamond [name]  DOB: [birth year]  MRN: [id number]  Age / Sex: 72 y.o., female  PCP: [facility] [place] Referring Physician: Barb Merino, MD  Reason for Consultation: Establishing goals of care and Hospice Evaluation  HPI/Patient Profile: 72 y.o. 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   Clinical Assessment and Goals of Care: Consult received and extensive chart review completed. I discussed plans and status with RN. Overall appetite poor and she initially refused HD although later agreed with RN. Plans for potential drainage of left psoas fluid collection.   Son, Diamond [name], is present at bedside. He reports that Ms. Lane has began most of her decline with infection. She has tolerated HD for ~2 years well until recently. Diamond [name] reports that his mother does overall well until infection flares. She has much support at home from her children, sister, nieces, nephews as well as [facility]. They all assist her at home and help get her to and from dialysis. I discussed with Diamond [name] that his mother's infection will be an ongoing issue that will continue to make her progress difficult overall. I expressed concern that she refuses and signs off dialysis early. Family is supportive to help her continue to be motivated. Diamond [name] is clear in his goal to help support his mother to stay motivated to continue with treatments and dialysis and improve as able. He reports that family are available to meet with me together tomorrow 01/25/23 at 1200 noon.   All questions/concerns addressed. Emotional support provided.    Primary Decision Maker NEXT OF KIN adult children - son Diamond [name] is main contact    SUMMARY OF RECOMMENDATIONS   - Ongoing goals of care discussion - Family meeting tomorrow 01/25/23 1200  Code Status/Advance Care Planning: Full code   Symptom Management:  Per attending, ID, nephrology  Prognosis:  Overall prognosis poor.   Discharge Planning: To Be Determined      Primary Diagnoses: Present on Admission:  Paroxysmal atrial flutter ([facility])  Hypertension  Acute on chronic osteomyelitis ([facility])  Acute metabolic encephalopathy  Metabolic acidosis  Discitis of lumbar region   I have reviewed the medical record, interviewed the patient and family, and examined the patient. The following aspects are pertinent.  Past Medical History:  Diagnosis Date   Anemia    Asthma    Atrial flutter with rapid ventricular response ([facility]) 12/25/2021   Bacteremia    ESRD (end stage renal disease) ([facility] [facility])    dialysis Tues Thurs Sat   Gout    Hepatitis C    Hypertension    PAF (paroxysmal atrial fibrillation) (HCC)    RBBB    Social History   Socioeconomic History   Marital status: Widowed    Spouse name: Not on file   Number of  children: Not on file   Years of education: Not on file   Highest education level: Not on file  Occupational History   Not on file  Tobacco Use   Smoking status: Never    Passive exposure: Never   Smokeless tobacco: Never  Vaping Use   Vaping Use: Never used  Substance and Sexual Activity   Alcohol use: No   Drug use: No   Sexual activity: Not Currently    Birth control/protection: Post-menopausal  Other Topics Concern   Not on file  Social History Narrative   Not on file   Social Determinants of Health   Financial Resource Strain: Not on file  Food Insecurity: No Food Insecurity (11/30/2022)   Hunger Vital Sign    Worried About Running Out of Food in the Last Year: Never true    Ran Out of Food in the Last Year: Never true  Transportation  Needs: No Transportation Needs (11/30/2022)   PRAPARE - Hydrologist (Medical): No    Lack of Transportation (Non-Medical): No  Physical Activity: Not on file  Stress: Not on file  Social Connections: Moderately Integrated (07/20/2022)   Social Connection and Isolation Panel [NHANES]    Frequency of Communication with Friends and Family: Three times a week    Frequency of Social Gatherings with Friends and Family: Three times a week    Attends Religious Services: 1 to 4 times per year    Active Member of Clubs or Organizations: Yes    Attends Archivist Meetings: 1 to 4 times per year    Marital Status: Widowed   Family History  Problem Relation Age of Onset   Hypertension Mother    Hypertension Father    Colon cancer Brother    Lung cancer Brother    Scheduled Meds:  (feeding supplement) PROSource Plus  30 mL Oral BID BM   amiodarone  200 mg Oral BID   Followed by   Derrill Memo ON 01/27/2023] amiodarone  200 mg Oral Daily   apixaban  2.5 mg Oral Q12H   Chlorhexidine Gluconate Cloth  6 each Topical Q0600   metoprolol tartrate  50 mg Oral BID   pantoprazole  40 mg Oral Daily   sevelamer carbonate  1,600 mg Oral TID WC   Continuous Infusions:  vancomycin Stopped (01/19/23 1204)   PRN Meds:.cloNIDine, HYDROcodone-acetaminophen, morphine injection, nitroGLYCERIN Allergies  Allergen Reactions   Shrimp (Diagnostic) Other (See Comments)    Unknown reaction   Other Swelling and Rash    Anesthesia - facial swelling, rash   Review of Systems  Unable to perform ROS: Acuity of condition    Physical Exam Vitals and nursing note reviewed.  Constitutional:      General: She is sleeping. She is not in acute distress.    Appearance: She is ill-appearing.  Cardiovascular:     Rate and Rhythm: Normal rate.  Pulmonary:     Effort: No tachypnea, accessory muscle usage or respiratory distress.  Abdominal:     Palpations: Abdomen is soft.   Neurological:     Mental Status: She is confused.     Comments: Overall sleepy and difficult to awaken     Vital Signs: BP (!) 164/105 (BP Location: Right Arm)   Pulse 74   Temp 98.7 F (37.1 C) (Oral)   Resp 11   Ht '5\' 6"'$  (1.676 m)   Wt 50.9 kg   SpO2 99%   BMI 18.11 kg/m  Pain  Scale: 0-10   Pain Score: Asleep   SpO2: SpO2: 99 % O2 Device:SpO2: 99 % O2 Flow Rate: .   IO: Intake/output summary:  Intake/Output Summary (Last 24 hours) at 01/23/2023 [id number] Last data filed at 01/22/2023 2308 Gross per 24 hour  Intake 100 ml  Output 900 ml  Net -800 ml    LBM: Last BM Date : 01/17/23 Baseline Weight: Weight: 52.8 kg Most recent weight: Weight: 50.9 kg     Palliative Assessment/Data:     Time In: 0900  Time Total: 60 min  Greater than 50%  of this time was spent counseling and coordinating care related to the above assessment and plan.  Signed by: Vinie Sill, NP Palliative Medicine Team Pager # [phone] (M-F 8a-5p) Team Phone # [phone] (Nights/Weekends)

## 2023-01-23 NOTE — Progress Notes (Signed)
Palliative:  I reached out to daughter, [name], who directed me to brother, Marchia Bond, for timing of family meeting/discussion regarding Ms. Sanroman's status (she is sleeping soundly at time of my initial visit). I have left voicemail with Marchia Bond. I have not heard back and will reach out again tomorrow.   No charge  Vinie Sill, NP Palliative Medicine Team Pager [phone] (Please see [email or web address] for schedule) Team Phone [phone]

## 2023-01-23 NOTE — Progress Notes (Signed)
PROGRESS NOTE    Diamond [name]  [id number] DOB: [birth year] DOA: 01/15/2023 PCP: [facility] [place]    Brief Narrative:  72 year old with history of ESRD on hemodialysis TTS, paroxysmal A-fib on Eliquis, essential hypertension, chronic hepatitis C, hypothyroidism, 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  MRI lumbar spine showed persistent findings concerning for osteomyelitis and discitis at L2 and 3, L4 and 5 with slight interval worsening of paravertebral inflammatory changes as compared to previous MRI.  2.3 cm heterogeneous area of involving left psoas muscle at the level of L3 likely phlegmon.  No drainable abscess.  Admitted with ID consultation.   Assessment & Plan:   Recurrent MRSA bacteremia, 12/2021, 06/2022, 09/2022. 2/10, blood cultures with MRSA. 2/11, blood cultures negative so far. 2/12, TTE with thickened aortic valve. 1/17, 2.1 x 1.37 multilobulated mobile mass attached to the RA/IVC junction. MRI on 2/10, Persistent osteomyelitis discitis with left psoas muscle phlegmon of 2.3 cm  MRI 2/18, persistent osteomyelitis and discitis, 2.5 cm abscess on the L3 level.  Repeat cultures cleared.  Remains on vancomycin with hemodialysis.  Plan to continue vancomycin likely lifelong. ID is following. Patient had plans for TEE, high risk of decompensation on anesthesia.  TEE was not pursued after discussing with family as it is not going to change any management, apparently patient should be on suppressive antibiotic therapy for lifelong. Not sure getting rid of 2 cm phlegmon/abscess will help recurrence of disease.  Will defer to ID.  Paroxysmal atrial flutter: Now rate controlled on amiodarone 200 mg twice daily, metoprolol 50 mg twice daily.  She is therapeutic on Eliquis.  ESRD on hemodialysis: Getting dialysis in the hospital.  Does not  tolerate dialysis at times.  Acute metabolic infective encephalopathy: As above.  Mental status improving today.  Goal of care: Multiple difficulty to treat conditions and intolerance to dialysis. Family agreeable to meet with palliative care.  Consulted.  DVT prophylaxis: apixaban (ELIQUIS) tablet 2.5 mg Start: 01/19/23 1800 apixaban (ELIQUIS) tablet 2.5 mg   Code Status: Full code Family Communication: Daughter and son on the phone 2/17. Disposition Plan: Status is: Inpatient Remains inpatient appropriate because: Severe medical disease, IV antibiotics, inpatient procedures     Consultants:  Nephrology Cardiology Palliative  Procedures:  None  Antimicrobials:  Vancomycin 2/10---   Subjective:  Patient seen and examined.  She came back from MRI.  Today she was alert awake.  She told me she is not hurting that much today.  Did not wanted to engage in much conversation but she told me she is fine.  Objective: Vitals:   01/23/23 0520 01/23/23 0920 01/23/23 1019 01/23/23 1020  BP: (!) 164/105 (!) 169/98 (!) 171/99 (!) 158/97  Pulse:  74 76   Resp: 11 11 16   $ Temp: 98.7 F (37.1 C) 98 F (36.7 C) 98 F (36.7 C)   TempSrc: Oral Oral Oral   SpO2:  98% 99%   Weight:      Height:        Intake/Output Summary (Last 24 hours) at 01/23/2023 1203 Last data filed at 01/22/2023 2308 Gross per 24 hour  Intake 100 ml  Output 900 ml  Net -800 ml   Filed Weights   01/17/23 1814 01/22/23 1252 01/22/23 1558  Weight: 53 kg 51.3 kg 50.9 kg    Examination:  General exam: Appears calm and comfortable.  Frail and  debilitated.  Chronically sick looking.  On room air. Respiratory system: No added sounds. Cardiovascular system: S1 & S2 heard, RRR. No pedal edema. Gastrointestinal system: Soft.  Nontender.  Bowel sound present.   Central nervous system: Alert and awake.  Flat affect.   Extremities: Moves all extremities.  Left upper extremity AV fistula present.    Data  Reviewed: I have personally reviewed following labs and imaging studies  CBC: Recent Labs  Lab 01/17/23 1524 01/19/23 0949 01/21/23 0238 01/22/23 1011  WBC 8.8 8.2 7.1 7.3  NEUTROABS  --   --  5.8  --   HGB 9.6* 9.7* 9.6* 9.3*  HCT 27.9* 28.9* 28.5* 26.0*  MCV 99.6 99.0 96.6 96.7  PLT 329 401* 414* [id number]   Basic Metabolic Panel: Recent Labs  Lab 01/17/23 1524 01/19/23 0949 01/21/23 0238 01/22/23 1011  NA 135 131* 134* 134*  K 4.2 4.2 4.3 4.7  CL 93* 88* 94* 95*  CO2 23 24 24 24  $ GLUCOSE 126* 83 107* 91  BUN 76* 54* 37* 53*  CREATININE 10.98* 7.49* 5.73* 7.52*  CALCIUM 9.1 9.8 9.6 9.2  PHOS 8.3* 7.0*  --  6.6*   GFR: Estimated Creatinine Clearance: 5.4 mL/min (A) (by C-G formula based on SCr of 7.52 mg/dL (H)). Liver Function Tests: Recent Labs  Lab 01/17/23 1524 01/19/23 0949 01/22/23 1011  ALBUMIN 2.1* 2.2* 2.2*   No results for input(s): "LIPASE", "AMYLASE" in the last 168 hours. No results for input(s): "AMMONIA" in the last 168 hours. Coagulation Profile: No results for input(s): "INR", "PROTIME" in the last 168 hours. Cardiac Enzymes: No results for input(s): "CKTOTAL", "CKMB", "CKMBINDEX", "TROPONINI" in the last 168 hours. BNP (last 3 results) No results for input(s): "PROBNP" in the last 8760 hours. HbA1C: No results for input(s): "HGBA1C" in the last 72 hours. CBG: No results for input(s): "GLUCAP" in the last 168 hours. Lipid Profile: No results for input(s): "CHOL", "HDL", "LDLCALC", "TRIG", "CHOLHDL", "LDLDIRECT" in the last 72 hours. Thyroid Function Tests: No results for input(s): "TSH", "T4TOTAL", "FREET4", "T3FREE", "THYROIDAB" in the last 72 hours. Anemia Panel: No results for input(s): "VITAMINB12", "FOLATE", "FERRITIN", "TIBC", "IRON", "RETICCTPCT" in the last 72 hours. Sepsis Labs: No results for input(s): "PROCALCITON", "LATICACIDVEN" in the last 168 hours.   Recent Results (from the past 240 hour(s))  Blood culture (routine x 2)      Status: Abnormal   Collection Time: 01/15/23  2:25 PM   Specimen: BLOOD  Result Value Ref Range Status   Specimen Description BLOOD SITE NOT SPECIFIED  Final   Special Requests   Final    BOTTLES DRAWN AEROBIC AND ANAEROBIC Blood Culture adequate volume   Culture  Setup Time   Final    ANAEROBIC BOTTLE ONLY GRAM POSITIVE COCCI IN CLUSTERS Organism ID to follow CRITICAL RESULT CALLED TO, READ BACK BY AND VERIFIED WITH:  C/ PHARMD C. PIERCE 01/16/23 1620 A. LAFRANCE Performed at [facility], [place] [address], [place], [place] [address]    Culture METHICILLIN RESISTANT STAPHYLOCOCCUS AUREUS (A)  Final   Report Status 01/18/2023 FINAL  Final   Organism ID, Bacteria METHICILLIN RESISTANT STAPHYLOCOCCUS AUREUS  Final      Susceptibility   Methicillin resistant staphylococcus aureus - MIC*    CIPROFLOXACIN <=0.5 SENSITIVE Sensitive     ERYTHROMYCIN <=0.25 SENSITIVE Sensitive     GENTAMICIN <=0.5 SENSITIVE Sensitive     OXACILLIN >=4 RESISTANT Resistant     TETRACYCLINE >=16 RESISTANT Resistant  VANCOMYCIN 1 SENSITIVE Sensitive     TRIMETH/SULFA <=10 SENSITIVE Sensitive     CLINDAMYCIN <=0.25 SENSITIVE Sensitive     RIFAMPIN <=0.5 SENSITIVE Sensitive     Inducible Clindamycin NEGATIVE Sensitive     * METHICILLIN RESISTANT STAPHYLOCOCCUS AUREUS  Blood Culture ID Panel (Reflexed)     Status: Abnormal   Collection Time: 01/15/23  2:25 PM  Result Value Ref Range Status   Enterococcus faecalis NOT DETECTED NOT DETECTED Final   Enterococcus Faecium NOT DETECTED NOT DETECTED Final   Listeria monocytogenes NOT DETECTED NOT DETECTED Final   Staphylococcus species DETECTED (A) NOT DETECTED Final    Comment: CRITICAL RESULT CALLED TO, READ BACK BY AND VERIFIED WITH:  C/ PHARMD C. PIERCE 01/16/23 1620 A. LAFRANCE    Staphylococcus aureus (BCID) DETECTED (A) NOT DETECTED Final    Comment: Methicillin (oxacillin)-resistant Staphylococcus aureus (MRSA). MRSA is predictably resistant to  beta-lactam antibiotics (except ceftaroline). Preferred therapy is vancomycin unless clinically contraindicated. Patient requires contact precautions if  hospitalized. CRITICAL RESULT CALLED TO, READ BACK BY AND VERIFIED WITH:  C/ PHARMD C. PIERCE 01/16/23 1620 A. LAFRANCE    Staphylococcus epidermidis NOT DETECTED NOT DETECTED Final   Staphylococcus lugdunensis NOT DETECTED NOT DETECTED Final   Streptococcus species NOT DETECTED NOT DETECTED Final   Streptococcus agalactiae NOT DETECTED NOT DETECTED Final   Streptococcus pneumoniae NOT DETECTED NOT DETECTED Final   Streptococcus pyogenes NOT DETECTED NOT DETECTED Final   A.calcoaceticus-baumannii NOT DETECTED NOT DETECTED Final   Bacteroides fragilis NOT DETECTED NOT DETECTED Final   Enterobacterales NOT DETECTED NOT DETECTED Final   Enterobacter cloacae complex NOT DETECTED NOT DETECTED Final   Escherichia coli NOT DETECTED NOT DETECTED Final   Klebsiella aerogenes NOT DETECTED NOT DETECTED Final   Klebsiella oxytoca NOT DETECTED NOT DETECTED Final   Klebsiella pneumoniae NOT DETECTED NOT DETECTED Final   Proteus species NOT DETECTED NOT DETECTED Final   Salmonella species NOT DETECTED NOT DETECTED Final   Serratia marcescens NOT DETECTED NOT DETECTED Final   Haemophilus influenzae NOT DETECTED NOT DETECTED Final   Neisseria meningitidis NOT DETECTED NOT DETECTED Final   Pseudomonas aeruginosa NOT DETECTED NOT DETECTED Final   Stenotrophomonas maltophilia NOT DETECTED NOT DETECTED Final   Candida albicans NOT DETECTED NOT DETECTED Final   Candida auris NOT DETECTED NOT DETECTED Final   Candida glabrata NOT DETECTED NOT DETECTED Final   Candida krusei NOT DETECTED NOT DETECTED Final   Candida parapsilosis NOT DETECTED NOT DETECTED Final   Candida tropicalis NOT DETECTED NOT DETECTED Final   Cryptococcus neoformans/gattii NOT DETECTED NOT DETECTED Final   Meth resistant mecA/C and MREJ DETECTED (A) NOT DETECTED Final    Comment:  CRITICAL RESULT CALLED TO, READ BACK BY AND VERIFIED WITH:  C/ PHARMD C. PIERCE 01/16/23 1620 A. LAFRANCE Performed at [facility], [place] [address] [place], [place] [address]   Culture, blood (Routine X 2) w Reflex to ID Panel     Status: None   Collection Time: 01/16/23  5:59 PM   Specimen: BLOOD RIGHT ARM  Result Value Ref Range Status   Specimen Description BLOOD RIGHT ARM  Final   Special Requests   Final    BOTTLES DRAWN AEROBIC AND ANAEROBIC Blood Culture results may not be optimal due to an inadequate volume of blood received in culture bottles   Culture   Final    NO GROWTH 5 DAYS Performed at [facility], [place] [address], [place], [place] [address]  Report Status 01/21/2023 FINAL  Final  Culture, blood (Routine X 2) w Reflex to ID Panel     Status: None   Collection Time: 01/16/23  5:59 PM   Specimen: BLOOD RIGHT ARM  Result Value Ref Range Status   Specimen Description BLOOD RIGHT ARM  Final   Special Requests   Final    BOTTLES DRAWN AEROBIC AND ANAEROBIC Blood Culture results may not be optimal due to an inadequate volume of blood received in culture bottles   Culture   Final    NO GROWTH 5 DAYS Performed at [facility], [place] [address], [place], [place] [address]    Report Status 01/21/2023 FINAL  Final         Radiology Studies: MR LUMBAR SPINE WO CONTRAST  Result Date: 01/23/2023 CLINICAL DATA:  Follow-up epidural phlegmon EXAM: MRI LUMBAR SPINE WITHOUT CONTRAST TECHNIQUE: Multiplanar, multisequence MR imaging of the lumbar spine was performed. No intravenous contrast was administered. COMPARISON:  Eight days ago FINDINGS: Segmentation: Transitional lumbosacral vertebra numbered L5 based on priors. Alignment:  Physiologic. Vertebrae: There is a chronic fracture through the L3 body, also seen on in October 2023 study with fluid-filled fracture cleft. Paravertebral edema on both sides affecting the psoas with left-sided partially septated  collection measuring up to 2.5 cm. L2-3 disc hyperintensity without notable L2 marrow edema. Chronic superior endplate fracture at L2. There may be involvement of the right facet at L2-3 where there is foraminal cystic appearance posteriorly measuring 7 mm. Posteriorly there is epidural material at the level of L2-3 best seen on sagittal T2 weighted imaging, contributing to spinal stenosis. L4-5 disc collapse and endplate irregularity with mild FLAIR hyperintensity. Compared with prior CT this is more remote discitis/osteomyelitis with possible completed healing. Conus medullaris and cauda equina: Conus extends to the T12-L1 level. Conus and cauda equina appear normal. Paraspinal and other soft tissues: Paravertebral inflammation is noted above. Disc levels: T12- L1: Unremarkable. L1-L2: Mild disc narrowing and bulging L2-L3: Disc narrowing and bulging. Right foraminal herniation or purulence suspected there may be some erosion of the right facet within anterior 7 mm cystic density. Right foraminal impingement and advanced spinal stenosis. L3-L4: Narrowing and bulging with ligamentum flavum thickening. Advanced spinal stenosis. Moderate biforaminal stenosis. L4-L5: Disc collapse and endplate destruction as noted above. There is circumferential endplate ridging with high-grade spinal stenosis and bilateral L5 impingement at the subarticular recesses. L5-S1:Incomplete segmentation IMPRESSION: 1. Transitional L5 vertebra as previously numbered. 2. L2-3 discitis/osteomyelitis with purulence in a chronic L3 body fracture. Progression of left psoas involvement at this level to an abscess appearance measuring up to 2.5 cm. There may be facet arthritis on the right at this level. Posterior epidural phlegmon/collection combines with degeneration to cause high-grade thecal sac narrowing at this level. 3. L4-5 collapse and endplate destruction from more chronic discitis/osteomyelitis. 4. L3-4 and L4-5 advanced degenerative  spinal stenosis. Electronically Signed   By: Jorje Guild M.D.   On: 01/23/2023 09:16        Scheduled Meds:  (feeding supplement) PROSource Plus  30 mL Oral BID BM   amiodarone  200 mg Oral BID   Followed by   Derrill Memo ON 01/27/2023] amiodarone  200 mg Oral Daily   apixaban  2.5 mg Oral Q12H   Chlorhexidine Gluconate Cloth  6 each Topical Q0600   metoprolol tartrate  50 mg Oral BID   pantoprazole  40 mg Oral Daily   sevelamer carbonate  1,600 mg Oral TID WC  Continuous Infusions:  vancomycin Stopped (01/19/23 1204)     LOS: 7 days    Time spent: 35 minutes    Barb Merino, MD [facility] Pager [phone]

## 2023-01-23 NOTE — Progress Notes (Signed)
[facility] Progress Note   Subjective:  Seen in room - keeps her eyes closed, but answering my questions more perkily today. Says back pain is a little better. No CP/dyspnea. Signed off HD early yesterday, but agreeable for HD tomorrow.  Objective Vitals:   01/22/23 2003 01/23/23 0028 01/23/23 0520 01/23/23 0920  BP: 136/84 (!) 141/105 (!) 164/105 (!) 169/98  Pulse:    74  Resp: 12 16 11 11  $ Temp: 98.1 F (36.7 C) 98.2 F (36.8 C) 98.7 F (37.1 C) 98 F (36.7 C)  TempSrc: Oral Oral Oral Oral  SpO2:    98%  Weight:      Height:       Physical Exam General: Frail woman, NAD. Answers questions with eyes closed. Heart:RRR; 2/6 murmur Lungs: CTA anteriorly Abdomen: soft Extremities: no LE edema Dialysis Access: LUE AVF + bruit  Additional Objective Labs: Basic Metabolic Panel: Recent Labs  Lab 01/17/23 1524 01/19/23 0949 01/21/23 0238 01/22/23 1011  NA 135 131* 134* 134*  K 4.2 4.2 4.3 4.7  CL 93* 88* 94* 95*  CO2 23 24 24 24  $ GLUCOSE 126* 83 107* 91  BUN 76* 54* 37* 53*  CREATININE 10.98* 7.49* 5.73* 7.52*  CALCIUM 9.1 9.8 9.6 9.2  PHOS 8.3* 7.0*  --  6.6*   Liver Function Tests: Recent Labs  Lab 01/17/23 1524 01/19/23 0949 01/22/23 1011  ALBUMIN 2.1* 2.2* 2.2*   CBC: Recent Labs  Lab 01/17/23 1524 01/19/23 0949 01/21/23 0238 01/22/23 1011  WBC 8.8 8.2 7.1 7.3  NEUTROABS  --   --  5.8  --   HGB 9.6* 9.7* 9.6* 9.3*  HCT 27.9* 28.9* 28.5* 26.0*  MCV 99.6 99.0 96.6 96.7  PLT 329 401* 414* 336   Studies/Results: MR LUMBAR SPINE WO CONTRAST  Result Date: 01/23/2023 CLINICAL DATA:  Follow-up epidural phlegmon EXAM: MRI LUMBAR SPINE WITHOUT CONTRAST TECHNIQUE: Multiplanar, multisequence MR imaging of the lumbar spine was performed. No intravenous contrast was administered. COMPARISON:  Eight days ago FINDINGS: Segmentation: Transitional lumbosacral vertebra numbered L5 based on priors. Alignment:  Physiologic. Vertebrae: There is a  chronic fracture through the L3 body, also seen on in October 2023 study with fluid-filled fracture cleft. Paravertebral edema on both sides affecting the psoas with left-sided partially septated collection measuring up to 2.5 cm. L2-3 disc hyperintensity without notable L2 marrow edema. Chronic superior endplate fracture at L2. There may be involvement of the right facet at L2-3 where there is foraminal cystic appearance posteriorly measuring 7 mm. Posteriorly there is epidural material at the level of L2-3 best seen on sagittal T2 weighted imaging, contributing to spinal stenosis. L4-5 disc collapse and endplate irregularity with mild FLAIR hyperintensity. Compared with prior CT this is more remote discitis/osteomyelitis with possible completed healing. Conus medullaris and cauda equina: Conus extends to the T12-L1 level. Conus and cauda equina appear normal. Paraspinal and other soft tissues: Paravertebral inflammation is noted above. Disc levels: T12- L1: Unremarkable. L1-L2: Mild disc narrowing and bulging L2-L3: Disc narrowing and bulging. Right foraminal herniation or purulence suspected there may be some erosion of the right facet within anterior 7 mm cystic density. Right foraminal impingement and advanced spinal stenosis. L3-L4: Narrowing and bulging with ligamentum flavum thickening. Advanced spinal stenosis. Moderate biforaminal stenosis. L4-L5: Disc collapse and endplate destruction as noted above. There is circumferential endplate ridging with high-grade spinal stenosis and bilateral L5 impingement at the subarticular recesses. L5-S1:Incomplete segmentation IMPRESSION: 1. Transitional L5 vertebra as previously numbered. 2.  L2-3 discitis/osteomyelitis with purulence in a chronic L3 body fracture. Progression of left psoas involvement at this level to an abscess appearance measuring up to 2.5 cm. There may be facet arthritis on the right at this level. Posterior epidural phlegmon/collection combines with  degeneration to cause high-grade thecal sac narrowing at this level. 3. L4-5 collapse and endplate destruction from more chronic discitis/osteomyelitis. 4. L3-4 and L4-5 advanced degenerative spinal stenosis. Electronically Signed   By: Jorje Guild M.D.   On: 01/23/2023 09:16    Medications:  vancomycin Stopped (01/19/23 1204)    (feeding supplement) PROSource Plus  30 mL Oral BID BM   amiodarone  200 mg Oral BID   Followed by   Derrill Memo ON 01/27/2023] amiodarone  200 mg Oral Daily   apixaban  2.5 mg Oral Q12H   Chlorhexidine Gluconate Cloth  6 each Topical Q0600   metoprolol tartrate  50 mg Oral BID   pantoprazole  40 mg Oral Daily   sevelamer carbonate  1,600 mg Oral TID WC    Dialysis Orders: MWF at [facility] 4hr, 400/600, EDW 52.5kg, 2K/2Ca, LUE AVF, 15g needles, no heparin - Mircera 262mg IV q 2 weeks (last given 2/7) - no VDRA, binder is Renvela 2/meals.   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 TTE 2/12 with thickened aortic valve. Repeat LS MRI 2/17 showing persistent discitis/phlegmon + abscess. ESRD: Usual MWF schedule - refused HD 2/16, dialyzed partially 2/17 - she signed off early. Next HD tomorrow - she is agreeable at this time. HTN/volume: BP high, no LE edema and below prior EDW - lips are dry. UF as tolerated. Anemia of ESRD: Hgb 9.3 - will be due for ESA on 2/21 Secondary hyperparathyroidism: CorrCa high, no VDRA. Phos high - but coming down, continue Renvela as binder for now. Nutrition: Alb low, continue supplements. A-fib: On amiodarone + Eliquis GOC: Palliative care team has been consulted.  KVeneta Penton PA-C 01/23/2023, 9:53 AM  [employer]

## 2023-01-24 ENCOUNTER — Encounter (HOSPITAL_COMMUNITY): Payer: Self-pay | Admitting: Family Medicine

## 2023-01-24 DIAGNOSIS — M8610 Other acute osteomyelitis, unspecified site: Secondary | ICD-10-CM | POA: Diagnosis not present

## 2023-01-24 DIAGNOSIS — M8660 Other chronic osteomyelitis, unspecified site: Secondary | ICD-10-CM | POA: Diagnosis not present

## 2023-01-24 DIAGNOSIS — N186 End stage renal disease: Secondary | ICD-10-CM | POA: Diagnosis not present

## 2023-01-24 DIAGNOSIS — Z515 Encounter for palliative care: Secondary | ICD-10-CM | POA: Diagnosis not present

## 2023-01-24 DIAGNOSIS — Z7189 Other specified counseling: Secondary | ICD-10-CM | POA: Diagnosis not present

## 2023-01-24 LAB — CBC
HCT: 26.4 % — ABNORMAL LOW (ref 36.0–46.0)
Hemoglobin: 8.6 g/dL — ABNORMAL LOW (ref 12.0–15.0)
MCH: 32.6 pg (ref 26.0–34.0)
MCHC: 32.6 g/dL (ref 30.0–36.0)
MCV: 100 fL (ref 80.0–100.0)
Platelets: 384 10*3/uL (ref 150–400)
RBC: 2.64 MIL/uL — ABNORMAL LOW (ref 3.87–5.11)
RDW: 20.1 % — ABNORMAL HIGH (ref 11.5–15.5)
WBC: 4.9 10*3/uL (ref 4.0–10.5)
nRBC: 0 % (ref 0.0–0.2)

## 2023-01-24 LAB — RENAL FUNCTION PANEL
Albumin: 2.1 g/dL — ABNORMAL LOW (ref 3.5–5.0)
Anion gap: 15 (ref 5–15)
BUN: 46 mg/dL — ABNORMAL HIGH (ref 8–23)
CO2: 25 mmol/L (ref 22–32)
Calcium: 9.4 mg/dL (ref 8.9–10.3)
Chloride: 96 mmol/L — ABNORMAL LOW (ref 98–111)
Creatinine, Ser: 7.08 mg/dL — ABNORMAL HIGH (ref 0.44–1.00)
GFR, Estimated: 6 mL/min — ABNORMAL LOW (ref >60)
Glucose, Bld: 88 mg/dL (ref 70–99)
Phosphorus: 6.1 mg/dL — ABNORMAL HIGH (ref 2.5–4.6)
Potassium: 4.6 mmol/L (ref 3.5–5.1)
Sodium: 136 mmol/L (ref 135–145)

## 2023-01-24 MED ORDER — VANCOMYCIN HCL 500 MG/100ML IV SOLN
500.0000 mg | INTRAVENOUS | Status: DC
  Administered 2023-01-25: 500 mg via INTRAVENOUS
  Filled 2023-01-24 (×2): qty 100

## 2023-01-24 NOTE — Progress Notes (Signed)
PT Cancellation Note  Patient Details Name: STEPHANEY DICOSTANZO MRN: [id number] DOB: September 09, 1951   Cancelled Treatment:    Reason Eval/Treat Not Completed: Patient at procedure or test/unavailable Attempted to see pt for PT tx but pt being taken off unit for procedure. Will f/u as able.  Lavone Nian, PT, DPT 01/24/23, 2:29 PM   Waunita Schooner 01/24/2023, 2:29 PM

## 2023-01-24 NOTE — Care Management Important Message (Signed)
Important Message  Patient Details  Name: Diamond [name] MRN: [id number] Date of Birth: January 18, 1951   Medicare Important Message Given:  Yes     Shelda Altes 01/24/2023, 8:32 AM

## 2023-01-24 NOTE — Progress Notes (Signed)
Received patient in bed to unit.  Alert and oriented.  Informed consent signed and in chart.   Boyd duration:3.5  Patient tolerated well.  Transported back to the room  Alert, without acute distress.  Hand-off given to patient's nurse.   Access used: left AVF Access issues: none  Total UF removed: 1L Medication(s) given: none   01/24/23 1852  Vitals  Temp 97.7 F (36.5 C)  Temp Source Oral  BP (!) 140/94  MAP (mmHg) 107  BP Location Right Arm  BP Method Automatic  Patient Position (if appropriate) Lying  Pulse Rate 64  Pulse Rate Source Monitor  ECG Heart Rate 67  Resp 20  Oxygen Therapy  SpO2 100 %  O2 Device Room Air  During Treatment Monitoring  Intra-Hemodialysis Comments Progressing as prescribed;Tolerated well;Tx completed  Dialysis Fluid Bolus Normal Saline  Bolus Amount (mL) 300 mL      Jaquasia Doscher [facility]

## 2023-01-24 NOTE — Plan of Care (Signed)
  Problem: Activity: Goal: Risk for activity intolerance will decrease Outcome: Not Progressing   

## 2023-01-24 NOTE — Progress Notes (Signed)
PROGRESS NOTE    Diamond [name]  [id number] DOB: 10/23/1951 DOA: 01/15/2023 PCP: [facility]    Brief Narrative:  72 year old with history of ESRD on hemodialysis TTS, paroxysmal A-fib on Eliquis, essential hypertension, chronic hepatitis C, hypothyroidism, 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  MRI lumbar spine showed persistent findings concerning for osteomyelitis and discitis at L2 and 3, L4 and 5 with slight interval worsening of paravertebral inflammatory changes as compared to previous MRI.  2.3 cm heterogeneous area of involving left psoas muscle at the level of L3 likely phlegmon.  No drainable abscess.  Admitted with ID consultation.   Assessment & Plan:   Recurrent MRSA bacteremia, 12/2021, 06/2022, 09/2022. 2/10, blood cultures with MRSA. 2/11, blood cultures negative so far. 2/12, TTE with thickened aortic valve. 1/17, 2.1 x 1.37 multilobulated mobile mass attached to the RA/IVC junction. MRI on 2/10, Persistent osteomyelitis discitis with left psoas muscle phlegmon of 2.3 cm  MRI 2/18, persistent osteomyelitis and discitis, 2.5 cm abscess on the L3 level/left psoas abscess.  Repeat cultures cleared.  Remains on vancomycin with hemodialysis.  Plan to continue vancomycin likely lifelong. ID is following. Patient had plans for TEE, high risk of decompensation on anesthesia.  TEE was not pursued after discussing with family as it is not going to change any management, apparently patient should be on suppressive antibiotic therapy for lifelong. Not sure getting rid of 2 cm phlegmon/abscess will help recurrence of disease.   Case discussed with interventional radiology today, they will attempt to aspirate left psoas abscess.  May help with decreasing bacterial load.  Paroxysmal atrial flutter: Now rate controlled on amiodarone 200  mg twice daily, metoprolol 50 mg twice daily.  She is therapeutic on Eliquis.  ESRD on hemodialysis: Getting dialysis in the hospital.  Does not tolerate dialysis at times.  Due for dialysis today.  Acute metabolic infective encephalopathy: As above.  Mental status improving but fluctuating.  Goal of care: Multiple difficulty to treat conditions and intolerance to dialysis. Family agreeable to meet with palliative care.  Consulted.  DVT prophylaxis: apixaban (ELIQUIS) tablet 2.5 mg Start: 01/19/23 1800 apixaban (ELIQUIS) tablet 2.5 mg   Code Status: Full code Family Communication: Patient's son at the bedside. Disposition Plan: Status is: Inpatient Remains inpatient appropriate because: Severe medical disease, IV antibiotics, inpatient procedures     Consultants:  Nephrology Cardiology Palliative  Procedures:  None  Antimicrobials:  Vancomycin 2/10---   Subjective:  Patient seen and examined.  She is sleepy.  Did not wake up for conversation.  Son was at the bedside.  He stated that she felt fine today and did not complain about any pain or difficulties. She is agreeable to go for hemodialysis and her son is at the bedside all day today to facilitate her procedure and dialysis today. Discussed with IR, they will attempt to aspirate the psoas abscess.  Will keep NPO.  Objective: Vitals:   01/23/23 1700 01/23/23 1927 01/24/23 0417 01/24/23 0649  BP: (!) 165/113 (!) 164/98 (!) 198/111   Pulse: 74 74 71   Resp: 18 18 16   $ Temp: 99.1 F (37.3 C) 98.6 F (37 C) 97.8 F (36.6 C)   TempSrc:  Oral Oral   SpO2: 100% 100% 99%   Weight:    50.7 kg  Height:        Intake/Output Summary (Last 24 hours) at  01/24/2023 1035 Last data filed at 01/24/2023 0047 Gross per 24 hour  Intake 0 ml  Output --  Net 0 ml    Filed Weights   01/22/23 1252 01/22/23 1558 01/24/23 0649  Weight: 51.3 kg 50.9 kg 50.7 kg    Examination:  General exam: Appears calm and comfortable.   Frail and debilitated.  On room air.  Sleepy now. Respiratory system: No added sounds. Cardiovascular system: S1 & S2 heard, RRR. No pedal edema. Gastrointestinal system: Soft.  Nontender.  Bowel sound present.   Central nervous system: Alert and awake on interaction.  Flat affect.   Extremities: Moves all extremities.  Left upper extremity AV fistula present.    Data Reviewed: I have personally reviewed following labs and imaging studies  CBC: Recent Labs  Lab 01/17/23 1524 01/19/23 0949 01/21/23 0238 01/22/23 1011  WBC 8.8 8.2 7.1 7.3  NEUTROABS  --   --  5.8  --   HGB 9.6* 9.7* 9.6* 9.3*  HCT 27.9* 28.9* 28.5* 26.0*  MCV 99.6 99.0 96.6 96.7  PLT 329 401* 414* [id number]    Basic Metabolic Panel: Recent Labs  Lab 01/17/23 1524 01/19/23 0949 01/21/23 0238 01/22/23 1011  NA 135 131* 134* 134*  K 4.2 4.2 4.3 4.7  CL 93* 88* 94* 95*  CO2 23 24 24 24  $ GLUCOSE 126* 83 107* 91  BUN 76* 54* 37* 53*  CREATININE 10.98* 7.49* 5.73* 7.52*  CALCIUM 9.1 9.8 9.6 9.2  PHOS 8.3* 7.0*  --  6.6*    GFR: Estimated Creatinine Clearance: 5.3 mL/min (A) (by C-G formula based on SCr of 7.52 mg/dL (H)). Liver Function Tests: Recent Labs  Lab 01/17/23 1524 01/19/23 0949 01/22/23 1011  ALBUMIN 2.1* 2.2* 2.2*    No results for input(s): "LIPASE", "AMYLASE" in the last 168 hours. No results for input(s): "AMMONIA" in the last 168 hours. Coagulation Profile: No results for input(s): "INR", "PROTIME" in the last 168 hours. Cardiac Enzymes: No results for input(s): "CKTOTAL", "CKMB", "CKMBINDEX", "TROPONINI" in the last 168 hours. BNP (last 3 results) No results for input(s): "PROBNP" in the last 8760 hours. HbA1C: No results for input(s): "HGBA1C" in the last 72 hours. CBG: No results for input(s): "GLUCAP" in the last 168 hours. Lipid Profile: No results for input(s): "CHOL", "HDL", "LDLCALC", "TRIG", "CHOLHDL", "LDLDIRECT" in the last 72 hours. Thyroid Function Tests: No results  for input(s): "TSH", "T4TOTAL", "FREET4", "T3FREE", "THYROIDAB" in the last 72 hours. Anemia Panel: No results for input(s): "VITAMINB12", "FOLATE", "FERRITIN", "TIBC", "IRON", "RETICCTPCT" in the last 72 hours. Sepsis Labs: No results for input(s): "PROCALCITON", "LATICACIDVEN" in the last 168 hours.   Recent Results (from the past 240 hour(s))  Blood culture (routine x 2)     Status: Abnormal   Collection Time: 01/15/23  2:25 PM   Specimen: BLOOD  Result Value Ref Range Status   Specimen Description BLOOD SITE NOT SPECIFIED  Final   Special Requests   Final    BOTTLES DRAWN AEROBIC AND ANAEROBIC Blood Culture adequate volume   Culture  Setup Time   Final    ANAEROBIC BOTTLE ONLY GRAM POSITIVE COCCI IN CLUSTERS Organism ID to follow CRITICAL RESULT CALLED TO, READ BACK BY AND VERIFIED WITH:  C/ PHARMD C. PIERCE 01/16/23 1620 A. LAFRANCE Performed at [facility], [place] [address], [place], [place] [address]    Culture METHICILLIN RESISTANT STAPHYLOCOCCUS AUREUS (A)  Final   Report Status 01/18/2023 FINAL  Final   Organism ID, Bacteria  METHICILLIN RESISTANT STAPHYLOCOCCUS AUREUS  Final      Susceptibility   Methicillin resistant staphylococcus aureus - MIC*    CIPROFLOXACIN <=0.5 SENSITIVE Sensitive     ERYTHROMYCIN <=0.25 SENSITIVE Sensitive     GENTAMICIN <=0.5 SENSITIVE Sensitive     OXACILLIN >=4 RESISTANT Resistant     TETRACYCLINE >=16 RESISTANT Resistant     VANCOMYCIN 1 SENSITIVE Sensitive     TRIMETH/SULFA <=10 SENSITIVE Sensitive     CLINDAMYCIN <=0.25 SENSITIVE Sensitive     RIFAMPIN <=0.5 SENSITIVE Sensitive     Inducible Clindamycin NEGATIVE Sensitive     * METHICILLIN RESISTANT STAPHYLOCOCCUS AUREUS  Blood Culture ID Panel (Reflexed)     Status: Abnormal   Collection Time: 01/15/23  2:25 PM  Result Value Ref Range Status   Enterococcus faecalis NOT DETECTED NOT DETECTED Final   Enterococcus Faecium NOT DETECTED NOT DETECTED Final   Listeria monocytogenes  NOT DETECTED NOT DETECTED Final   Staphylococcus species DETECTED (A) NOT DETECTED Final    Comment: CRITICAL RESULT CALLED TO, READ BACK BY AND VERIFIED WITH:  C/ PHARMD C. PIERCE 01/16/23 1620 A. LAFRANCE    Staphylococcus aureus (BCID) DETECTED (A) NOT DETECTED Final    Comment: Methicillin (oxacillin)-resistant Staphylococcus aureus (MRSA). MRSA is predictably resistant to beta-lactam antibiotics (except ceftaroline). Preferred therapy is vancomycin unless clinically contraindicated. Patient requires contact precautions if  hospitalized. CRITICAL RESULT CALLED TO, READ BACK BY AND VERIFIED WITH:  C/ PHARMD C. PIERCE 01/16/23 1620 A. LAFRANCE    Staphylococcus epidermidis NOT DETECTED NOT DETECTED Final   Staphylococcus lugdunensis NOT DETECTED NOT DETECTED Final   Streptococcus species NOT DETECTED NOT DETECTED Final   Streptococcus agalactiae NOT DETECTED NOT DETECTED Final   Streptococcus pneumoniae NOT DETECTED NOT DETECTED Final   Streptococcus pyogenes NOT DETECTED NOT DETECTED Final   A.calcoaceticus-baumannii NOT DETECTED NOT DETECTED Final   Bacteroides fragilis NOT DETECTED NOT DETECTED Final   Enterobacterales NOT DETECTED NOT DETECTED Final   Enterobacter cloacae complex NOT DETECTED NOT DETECTED Final   Escherichia coli NOT DETECTED NOT DETECTED Final   Klebsiella aerogenes NOT DETECTED NOT DETECTED Final   Klebsiella oxytoca NOT DETECTED NOT DETECTED Final   Klebsiella pneumoniae NOT DETECTED NOT DETECTED Final   Proteus species NOT DETECTED NOT DETECTED Final   Salmonella species NOT DETECTED NOT DETECTED Final   Serratia marcescens NOT DETECTED NOT DETECTED Final   Haemophilus influenzae NOT DETECTED NOT DETECTED Final   Neisseria meningitidis NOT DETECTED NOT DETECTED Final   Pseudomonas aeruginosa NOT DETECTED NOT DETECTED Final   Stenotrophomonas maltophilia NOT DETECTED NOT DETECTED Final   Candida albicans NOT DETECTED NOT DETECTED Final   Candida auris NOT  DETECTED NOT DETECTED Final   Candida glabrata NOT DETECTED NOT DETECTED Final   Candida krusei NOT DETECTED NOT DETECTED Final   Candida parapsilosis NOT DETECTED NOT DETECTED Final   Candida tropicalis NOT DETECTED NOT DETECTED Final   Cryptococcus neoformans/gattii NOT DETECTED NOT DETECTED Final   Meth resistant mecA/C and MREJ DETECTED (A) NOT DETECTED Final    Comment: CRITICAL RESULT CALLED TO, READ BACK BY AND VERIFIED WITH:  C/ PHARMD C. PIERCE 01/16/23 1620 A. LAFRANCE Performed at [facility], [place] [address], [place], [place] [address]   Culture, blood (Routine X 2) w Reflex to ID Panel     Status: None   Collection Time: 01/16/23  5:59 PM   Specimen: BLOOD RIGHT ARM  Result Value Ref Range Status   Specimen Description BLOOD  RIGHT ARM  Final   Special Requests   Final    BOTTLES DRAWN AEROBIC AND ANAEROBIC Blood Culture results may not be optimal due to an inadequate volume of blood received in culture bottles   Culture   Final    NO GROWTH 5 DAYS Performed at [facility], [place] [address], [place], [place] [address]    Report Status 01/21/2023 FINAL  Final  Culture, blood (Routine X 2) w Reflex to ID Panel     Status: None   Collection Time: 01/16/23  5:59 PM   Specimen: BLOOD RIGHT ARM  Result Value Ref Range Status   Specimen Description BLOOD RIGHT ARM  Final   Special Requests   Final    BOTTLES DRAWN AEROBIC AND ANAEROBIC Blood Culture results may not be optimal due to an inadequate volume of blood received in culture bottles   Culture   Final    NO GROWTH 5 DAYS Performed at [facility], [place] [address], [place], [place] [address]    Report Status 01/21/2023 FINAL  Final         Radiology Studies: MR LUMBAR SPINE WO CONTRAST  Result Date: 01/23/2023 CLINICAL DATA:  Follow-up epidural phlegmon EXAM: MRI LUMBAR SPINE WITHOUT CONTRAST TECHNIQUE: Multiplanar, multisequence MR imaging of the lumbar spine was performed. No intravenous  contrast was administered. COMPARISON:  Eight days ago FINDINGS: Segmentation: Transitional lumbosacral vertebra numbered L5 based on priors. Alignment:  Physiologic. Vertebrae: There is a chronic fracture through the L3 body, also seen on in October 2023 study with fluid-filled fracture cleft. Paravertebral edema on both sides affecting the psoas with left-sided partially septated collection measuring up to 2.5 cm. L2-3 disc hyperintensity without notable L2 marrow edema. Chronic superior endplate fracture at L2. There may be involvement of the right facet at L2-3 where there is foraminal cystic appearance posteriorly measuring 7 mm. Posteriorly there is epidural material at the level of L2-3 best seen on sagittal T2 weighted imaging, contributing to spinal stenosis. L4-5 disc collapse and endplate irregularity with mild FLAIR hyperintensity. Compared with prior CT this is more remote discitis/osteomyelitis with possible completed healing. Conus medullaris and cauda equina: Conus extends to the T12-L1 level. Conus and cauda equina appear normal. Paraspinal and other soft tissues: Paravertebral inflammation is noted above. Disc levels: T12- L1: Unremarkable. L1-L2: Mild disc narrowing and bulging L2-L3: Disc narrowing and bulging. Right foraminal herniation or purulence suspected there may be some erosion of the right facet within anterior 7 mm cystic density. Right foraminal impingement and advanced spinal stenosis. L3-L4: Narrowing and bulging with ligamentum flavum thickening. Advanced spinal stenosis. Moderate biforaminal stenosis. L4-L5: Disc collapse and endplate destruction as noted above. There is circumferential endplate ridging with high-grade spinal stenosis and bilateral L5 impingement at the subarticular recesses. L5-S1:Incomplete segmentation IMPRESSION: 1. Transitional L5 vertebra as previously numbered. 2. L2-3 discitis/osteomyelitis with purulence in a chronic L3 body fracture. Progression of left  psoas involvement at this level to an abscess appearance measuring up to 2.5 cm. There may be facet arthritis on the right at this level. Posterior epidural phlegmon/collection combines with degeneration to cause high-grade thecal sac narrowing at this level. 3. L4-5 collapse and endplate destruction from more chronic discitis/osteomyelitis. 4. L3-4 and L4-5 advanced degenerative spinal stenosis. Electronically Signed   By: Jorje Guild M.D.   On: 01/23/2023 09:16        Scheduled Meds:  (feeding supplement) PROSource Plus  30 mL Oral BID BM   amiodarone  200 mg Oral BID   Followed by   Derrill Memo ON 01/27/2023] amiodarone  200 mg Oral Daily   apixaban  2.5 mg Oral Q12H   Chlorhexidine Gluconate Cloth  6 each Topical Q0600   metoprolol tartrate  50 mg Oral BID   pantoprazole  40 mg Oral Daily   sevelamer carbonate  1,600 mg Oral TID WC   Continuous Infusions:  vancomycin Stopped (01/19/23 1204)     LOS: 8 days    Time spent: 35 minutes    Barb Merino, MD [facility] Pager [phone]

## 2023-01-24 NOTE — Consult Note (Signed)
Chief Complaint: Patient was seen in consultation today for psoas abscess  Referring Physician(s): Dr. Barb Merino  Supervising Physician: Ruthann Cancer  Patient Status: [name] - Out-pt  History of Present Illness: Diamond [name] is a 72 y.o. 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 MRI lumbar spine showed persistent findings concerning for osteomyelitis and discitis at L2 and 3, L4 and 5 with slight interval worsening of paravertebral inflammatory changes as compared to previous MRI. 2.3 cm heterogeneous area of involving left psoas muscle.  Subsequent imaging showed progression of the left psoas abscess, now with drainable component.  IR consulted for aspiration. Case reviewed and approved by Dr. Serafina Royals.    Past Medical History:  Diagnosis Date   Anemia    Asthma    Atrial flutter with rapid ventricular response ([place]) 12/25/2021   Bacteremia    ESRD (end stage renal disease) ([place])    dialysis Tues Thurs Sat   Gout    Hepatitis C    Hypertension    PAF (paroxysmal atrial fibrillation) ([place])    RBBB     Past Surgical History:  Procedure Laterality Date   AV FISTULA PLACEMENT Left 12/23/2021   Procedure: LEFT ARM BRACHIOBASILIC VEIN ARTERIOVENOUS (AV) FISTULA CREATION;  Surgeon: [name], MD;  Location: [place];  Service: Vascular;  Laterality: Left;   [place] Left 03/08/2022   Procedure: LEFT SECOND STAGE BASILIC VEIN TRANSPOSITION;  Surgeon: [name], MD;  Location: [place];  Service: Vascular;  Laterality: Left;   BIOPSY  01/19/2022   Procedure: BIOPSY;  Surgeon: Lavena Bullion, DO;  Location: [place] ENDOSCOPY;  Service: Gastroenterology;;   [place]  N/A 06/25/2022   Procedure: OPEN CHOLECYSTECTOMY;  Surgeon: Dwan Bolt, MD;  Location: [place];  Service: General;  Laterality: N/A;   COLONOSCOPY Left 01/19/2022   Procedure: COLONOSCOPY;  Surgeon: Lavena Bullion, DO;  Location: [place];  Service: Gastroenterology;  Laterality: Left;   ESOPHAGOGASTRODUODENOSCOPY Left 01/19/2022   Procedure: ESOPHAGOGASTRODUODENOSCOPY (EGD);  Surgeon: Lavena Bullion, DO;  Location: [facility] ENDOSCOPY;  Service: Gastroenterology;  Laterality: Left;   INSERTION OF DIALYSIS CATHETER Right 12/23/2021   Procedure: INSERTION OF DIALYSIS CATHETER;  Surgeon: [name], MD;  Location: [place];  Service: Vascular;  Laterality: Right;   LAPAROSCOPY N/A 06/25/2022   Procedure: STAGING LAPAROSCOPY;  Surgeon: Dwan Bolt, MD;  Location: [place];  Service: General;  Laterality: N/A;   OPEN PARTIAL HEPATECTOMY  N/A 06/25/2022   Procedure: OPEN PARTIAL HEPATECTOMY;  Surgeon: Dwan Bolt, MD;  Location: [place];  Service: General;  Laterality: N/A;   POLYPECTOMY  01/19/2022   Procedure: POLYPECTOMY;  Surgeon: Lavena Bullion, DO;  Location: [place] ENDOSCOPY;  Service: Gastroenterology;;   TEE WITHOUT CARDIOVERSION N/A 12/22/2021   Procedure: TRANSESOPHAGEAL ECHOCARDIOGRAM (TEE);  Surgeon: Jerline Pain, MD;  Location: [facility] ENDOSCOPY;  Service: Cardiovascular;  Laterality: N/A;   TEE WITHOUT CARDIOVERSION N/A 06/17/2022   Procedure: TRANSESOPHAGEAL ECHOCARDIOGRAM (TEE);  Surgeon: Buford Dresser, MD;  Location: [place];  Service: Cardiovascular;  Laterality: N/A;   ULTRASOUND GUIDANCE FOR VASCULAR ACCESS  12/23/2021   Procedure: ULTRASOUND GUIDANCE FOR VASCULAR ACCESS;  Surgeon: [name], MD;  Location: [place];  Service: Vascular;;  Allergies: Shrimp (diagnostic) and Other  Medications: Prior to Admission medications   Medication Sig Start Date End Date Taking? Authorizing Provider  acetaminophen (TYLENOL) 325 MG tablet Take 2 tablets (650 mg  total) by mouth 4 (four) times daily. Patient taking differently: Take 650 mg by mouth every 12 (twelve) hours as needed for moderate pain. 06/30/22  Yes Elgergawy, Silver Huguenin, MD  albuterol (VENTOLIN HFA) 108 (90 Base) MCG/ACT inhaler Inhale 2 puffs into the lungs every 4 (four) hours as needed for wheezing or shortness of breath. 10/06/22  Yes Ripley Fraise, MD  apixaban (ELIQUIS) 2.5 MG TABS tablet Take 1 tablet (2.5 mg total) by mouth 2 (two) times daily. 10/06/22 12/01/23 Yes Ripley Fraise, MD  fluticasone-salmeterol (ADVAIR DISKUS) 250-50 MCG/ACT AEPB Inhale 1 puff into the lungs in the morning and at bedtime. 10/06/22  Yes Ripley Fraise, MD  gabapentin (NEURONTIN) 100 MG capsule Take 1 capsule (100 mg total) by mouth at bedtime. Patient taking differently: Take 100 mg by mouth See admin instructions. Twice a day on regular days, Tuesday, Thursday, Saturday and Sunday. Three times a day on dialysis days, Monday, Wednesday, and Friday 10/15/22  Yes Vu, Trung T, MD  hydrALAZINE (APRESOLINE) 100 MG tablet Take 1 tablet by mouth 3 times a day every Monday Wednesday Friday and Sunday Take 1 tablet by mouth 2 times a day every Tuesday Thursday and Saturday Patient taking differently: Take 50 mg by mouth See admin instructions. 50 mg three times daily on dialysis days .Monday, Wednesday, Fridays 50 mg twice daily on regular days Sunday, Tuesday, Thursday, Saturday 10/06/22  Yes Ripley Fraise, MD  Methoxy PEG-Epoetin Beta (MIRCERA IJ) Mircera 10/09/22 10/08/23 Yes [provider]  metoprolol succinate (TOPROL XL) 25 MG 24 hr tablet Take 2 tablets (50 mg total) by mouth daily. Patient taking differently: Take 50 mg by mouth 2 (two) times daily. 12/03/22  Yes Linward Natal, MD  pantoprazole (PROTONIX) 40 MG tablet Take 1 tablet (40 mg total) by mouth daily. Patient taking differently: Take 40 mg by mouth 2 (two) times daily. 10/06/22  Yes Ripley Fraise, MD  sevelamer (RENAGEL) 800 MG  tablet Take 2 tablets by mouth 3 times a day every Tuesday Thursday and Saturday Give 2 tablets by mouth with meals every Monday Wednesday Friday and Sunday Patient taking differently: Take 800 mg by mouth 3 (three) times daily with meals. 10/06/22  Yes Ripley Fraise, MD  sevelamer carbonate (RENVELA) 0.8 g PACK packet Take 0.8 g by mouth with breakfast, with lunch, and with evening meal.   Yes [provider]  vancomycin IVPB Inject 500 mg into the vein every Monday, Wednesday, and Friday with hemodialysis. Indication:  Recurrent MRSA discitis First Dose: Yes Last Day of Therapy:  03/13/23 Labs - Sunday/Monday:  CBC/D, BMP, and vancomycin trough. Labs - Thursday:  BMP and vancomycin trough Labs - Every other week:  ESR and CRP Method of administration: Per HD protocol - to be given with hemodialysis Method of administration may be changed at the discretion of the patient and/or caregiver's ability to self-administer the medication ordered. 01/21/23 03/13/23 Yes Rosiland Oz, MD  cloNIDine (CATAPRES) 0.1 MG tablet Take 0.1 mg by mouth every 6 (six) hours as needed (SBP > 170). Patient not taking: Reported on 01/16/2023    [provider]     Family History  Problem Relation Age of Onset   Hypertension Mother    Hypertension Father    Colon cancer Brother    Lung cancer  Brother     Social History   Socioeconomic History   Marital status: Widowed    Spouse name: Not on file   Number of children: Not on file   Years of education: Not on file   Highest education level: Not on file  Occupational History   Not on file  Tobacco Use   Smoking status: Never    Passive exposure: Never   Smokeless tobacco: Never  Vaping Use   Vaping Use: Never used  Substance and Sexual Activity   Alcohol use: No   Drug use: No   Sexual activity: Not Currently    Birth control/protection: Post-menopausal  Other Topics Concern   Not on file  Social History Narrative   Not on  file   Social Determinants of Health   Financial Resource Strain: Not on file  Food Insecurity: No Food Insecurity (11/30/2022)   Hunger Vital Sign    Worried About Running Out of Food in the Last Year: Never true    Ran Out of Food in the Last Year: Never true  Transportation Needs: No Transportation Needs (11/30/2022)   PRAPARE - Hydrologist (Medical): No    Lack of Transportation (Non-Medical): No  Physical Activity: Not on file  Stress: Not on file  Social Connections: Moderately Integrated (07/20/2022)   Social Connection and Isolation Panel [NHANES]    Frequency of Communication with Friends and Family: Three times a week    Frequency of Social Gatherings with Friends and Family: Three times a week    Attends Religious Services: 1 to 4 times per year    Active Member of Clubs or Organizations: Yes    Attends Archivist Meetings: 1 to 4 times per year    Marital Status: Widowed     Review of Systems: A 12 point ROS discussed and pertinent positives are indicated in the HPI above.  All other systems are negative.  Review of Systems  Unable to perform ROS: Mental status change    Vital Signs: BP (!) 200/103 (BP Location: Right Arm)   Pulse 72   Temp 97.6 F (36.4 C) (Axillary)   Resp 16   Ht 5' 6"$  (1.676 m)   Wt 111 lb 12.4 oz (50.7 kg)   SpO2 100%   BMI 18.04 kg/m   Physical Exam Vitals and nursing note reviewed.  Constitutional:      General: She is not in acute distress.    Appearance: Normal appearance. She is not ill-appearing.  Cardiovascular:     Rate and Rhythm: Normal rate and regular rhythm.  Pulmonary:     Effort: Pulmonary effort is normal.     Breath sounds: Normal breath sounds.  Skin:    General: Skin is warm and dry.  Neurological:     Mental Status: She is alert. She is disoriented.     Comments: Sleepy, does not participate in exam or conversation.       MD Evaluation Airway: WNL Heart:  WNL Abdomen: WNL Chest/ Lungs: WNL ASA  Classification: 3 Mallampati/Airway Score: Two   Imaging: MR LUMBAR SPINE WO CONTRAST  Result Date: 01/23/2023 CLINICAL DATA:  Follow-up epidural phlegmon EXAM: MRI LUMBAR SPINE WITHOUT CONTRAST TECHNIQUE: Multiplanar, multisequence MR imaging of the lumbar spine was performed. No intravenous contrast was administered. COMPARISON:  Eight days ago FINDINGS: Segmentation: Transitional lumbosacral vertebra numbered L5 based on priors. Alignment:  Physiologic. Vertebrae: There is a chronic fracture through the L3 body, also seen  on in October 2023 study with fluid-filled fracture cleft. Paravertebral edema on both sides affecting the psoas with left-sided partially septated collection measuring up to 2.5 cm. L2-3 disc hyperintensity without notable L2 marrow edema. Chronic superior endplate fracture at L2. There may be involvement of the right facet at L2-3 where there is foraminal cystic appearance posteriorly measuring 7 mm. Posteriorly there is epidural material at the level of L2-3 best seen on sagittal T2 weighted imaging, contributing to spinal stenosis. L4-5 disc collapse and endplate irregularity with mild FLAIR hyperintensity. Compared with prior CT this is more remote discitis/osteomyelitis with possible completed healing. Conus medullaris and cauda equina: Conus extends to the T12-L1 level. Conus and cauda equina appear normal. Paraspinal and other soft tissues: Paravertebral inflammation is noted above. Disc levels: T12- L1: Unremarkable. L1-L2: Mild disc narrowing and bulging L2-L3: Disc narrowing and bulging. Right foraminal herniation or purulence suspected there may be some erosion of the right facet within anterior 7 mm cystic density. Right foraminal impingement and advanced spinal stenosis. L3-L4: Narrowing and bulging with ligamentum flavum thickening. Advanced spinal stenosis. Moderate biforaminal stenosis. L4-L5: Disc collapse and endplate  destruction as noted above. There is circumferential endplate ridging with high-grade spinal stenosis and bilateral L5 impingement at the subarticular recesses. L5-S1:Incomplete segmentation IMPRESSION: 1. Transitional L5 vertebra as previously numbered. 2. L2-3 discitis/osteomyelitis with purulence in a chronic L3 body fracture. Progression of left psoas involvement at this level to an abscess appearance measuring up to 2.5 cm. There may be facet arthritis on the right at this level. Posterior epidural phlegmon/collection combines with degeneration to cause high-grade thecal sac narrowing at this level. 3. L4-5 collapse and endplate destruction from more chronic discitis/osteomyelitis. 4. L3-4 and L4-5 advanced degenerative spinal stenosis. Electronically Signed   By: Jorje Guild M.D.   On: 01/23/2023 09:16   ECHOCARDIOGRAM COMPLETE  Result Date: 01/17/2023    ECHOCARDIOGRAM REPORT   Patient Name:   Diamond [name] Date of Exam: 01/17/2023 Medical Rec #:  [id number]      Height:       66.0 in Accession #:    [id number]     Weight:       116.4 lb Date of Birth:  1951-11-29     BSA:          1.589 m Patient Age:    73 years       BP:           181/97 mmHg Patient Gender: F              HR:           80 bpm. Exam Location:  Inpatient Procedure: 2D Echo Indications:    bacteremia  History:        Patient has prior history of Echocardiogram examinations, most                 recent 09/22/2022.  Sonographer:    Harvie Junior Referring Phys: [place]  1. Left ventricular ejection fraction, by estimation, is 60 to 65%. The left ventricle has normal function. The left ventricle has no regional wall motion abnormalities. Left ventricular diastolic parameters are consistent with Grade II diastolic dysfunction (pseudonormalization).  2. Right ventricular systolic function is normal. The right ventricular size is normal. There is mildly elevated pulmonary artery systolic pressure.  3. The mitral  valve is abnormal. Trivial mitral valve regurgitation. No evidence of mitral stenosis. Moderate mitral annular calcification.  4. The  aortic valve is tricuspid. There is moderate calcification of the aortic valve. There is mild thickening of the aortic valve. Aortic valve regurgitation is trivial. Aortic valve sclerosis/calcification is present, without any evidence of aortic stenosis.  5. The inferior vena cava is normal in size with greater than 50% respiratory variability, suggesting right atrial pressure of 3 mmHg. Comparison(s): No significant change from prior study. Conclusion(s)/Recommendation(s): No evidence of valvular vegetations on this transthoracic echocardiogram. Consider a transesophageal echocardiogram to exclude infective endocarditis if clinically indicated. FINDINGS  Left Ventricle: Left ventricular ejection fraction, by estimation, is 60 to 65%. The left ventricle has normal function. The left ventricle has no regional wall motion abnormalities. The left ventricular internal cavity size was normal in size. There is  borderline left ventricular hypertrophy. Left ventricular diastolic parameters are consistent with Grade II diastolic dysfunction (pseudonormalization). Right Ventricle: The right ventricular size is normal. No increase in right ventricular wall thickness. Right ventricular systolic function is normal. There is mildly elevated pulmonary artery systolic pressure. The tricuspid regurgitant velocity is 3.09  m/s, and with an assumed right atrial pressure of 3 mmHg, the estimated right ventricular systolic pressure is [id number] mmHg. Left Atrium: Left atrial size was normal in size. Right Atrium: Right atrial size was normal in size. Pericardium: There is no evidence of pericardial effusion. Mitral Valve: The mitral valve is abnormal. There is mild thickening of the mitral valve leaflet(s). There is mild calcification of the mitral valve leaflet(s). Moderate mitral annular calcification.  Trivial mitral valve regurgitation. No evidence of mitral valve stenosis. Tricuspid Valve: The tricuspid valve is normal in structure. Tricuspid valve regurgitation is mild . No evidence of tricuspid stenosis. Aortic Valve: The aortic valve is tricuspid. There is moderate calcification of the aortic valve. There is mild thickening of the aortic valve. Aortic valve regurgitation is trivial. Aortic valve sclerosis/calcification is present, without any evidence of aortic stenosis. Aortic valve mean gradient measures 5.0 mmHg. Aortic valve peak gradient measures 8.9 mmHg. Aortic valve area, by VTI measures 2.10 cm. Pulmonic Valve: The pulmonic valve was grossly normal. Pulmonic valve regurgitation is not visualized. No evidence of pulmonic stenosis. Aorta: The aortic root, ascending aorta, aortic arch and descending aorta are all structurally normal, with no evidence of dilitation or obstruction. Venous: The inferior vena cava is normal in size with greater than 50% respiratory variability, suggesting right atrial pressure of 3 mmHg. IAS/Shunts: The atrial septum is grossly normal.  LEFT VENTRICLE PLAX 2D LVIDd:         5.05 cm      Diastology LVIDs:         3.95 cm      LV e' medial:    7.15 cm/s LV PW:         1.00 cm      LV E/e' medial:  16.8 LV IVS:        1.05 cm      LV e' lateral:   8.70 cm/s LVOT diam:     1.90 cm      LV E/e' lateral: 13.8 LV SV:         62 LV SV Index:   39 LVOT Area:     2.84 cm                              3D Volume EF: LV Volumes (MOD)            3D EF:  59 % LV vol d, MOD A2C: 141.0 ml LV EDV:       114 ml LV vol d, MOD A4C: 134.0 ml LV ESV:       47 ml LV vol s, MOD A2C: 52.2 ml  LV SV:        67 ml LV vol s, MOD A4C: 48.5 ml LV SV MOD A2C:     88.8 ml LV SV MOD A4C:     134.0 ml LV SV MOD BP:      93.5 ml RIGHT VENTRICLE RV Basal diam:  3.50 cm RV Mid diam:    2.40 cm RV S prime:     18.80 cm/s TAPSE (M-mode): 3.0 cm LEFT ATRIUM           Index        RIGHT ATRIUM            Index LA diam:      3.50 cm 2.20 cm/m   RA Area:     12.90 cm LA Vol (A4C): 47.7 ml 30.02 ml/m  RA Volume:   29.60 ml  18.63 ml/m  AORTIC VALVE                    PULMONIC VALVE AV Area (Vmax):    2.25 cm     PV Vmax:       1.28 m/s AV Area (Vmean):   2.15 cm     PV Peak grad:  6.6 mmHg AV Area (VTI):     2.10 cm AV Vmax:           149.00 cm/s AV Vmean:          99.500 cm/s AV VTI:            0.297 m AV Peak Grad:      8.9 mmHg AV Mean Grad:      5.0 mmHg LVOT Vmax:         118.00 cm/s LVOT Vmean:        75.600 cm/s LVOT VTI:          0.220 m LVOT/AV VTI ratio: 0.74  AORTA Ao Root diam: 3.10 cm Ao Asc diam:  3.50 cm MITRAL VALVE                TRICUSPID VALVE MV Area (PHT): 4.31 cm     TR Peak grad:   38.2 mmHg MV Decel Time: 176 msec     TR Vmax:        309.00 cm/s MR Peak grad: 26.8 mmHg MR Vmax:      259.00 cm/s   SHUNTS MV E velocity: 120.00 cm/s  Systemic VTI:  0.22 m MV A velocity: 113.00 cm/s  Systemic Diam: 1.90 cm MV E/A ratio:  1.06 Buford Dresser MD Electronically signed by Buford Dresser MD Signature Date/Time: 01/17/2023/2:55:12 PM    Final    MR LUMBAR SPINE WO CONTRAST  Result Date: 01/15/2023 CLINICAL DATA:  Initial evaluation for worsened lower back pain. EXAM: MRI LUMBAR SPINE WITHOUT CONTRAST TECHNIQUE: Multiplanar, multisequence MR imaging of the lumbar spine was performed. No intravenous contrast was administered. COMPARISON:  Previous MRI from 11/30/2022. FINDINGS: Segmentation: Transitional lumbosacral anatomy with sacralization of L5. Same numbering system employed as on previous exam. Alignment: Stable trace anterolisthesis of L1 on L2 and L3 on L4. Underlying mild scoliosis. Vertebrae: Persistent endplate irregularity with decreased T1 signal intensity, and marrow edema about the L2-3 and L4-5 interspaces. Similar intra discal fluid  signal intensity at L2-3 and L3-4. Underlying chronic L2 and L3 compression deformities, stable. No acute or interval fracture. Bone  marrow signal intensity heterogeneous without worrisome osseous lesion. Conus medullaris and cauda equina: Conus extends to the L1 level. Conus and cauda equina appear normal. Soft tissue material within the ventral epidural space at L3 suspicious for phlegmon, similar to prior. Paraspinal and other soft tissues: Paravertebral edema and inflammatory changes seen involving the left greater than right psoas musculature. Overall changes are slightly worsened from prior. Superimposed heterogeneous T2 hypointense area measuring 2.3 cm within the left psoas muscle is increased from prior, likely phlegmon. No drainable soft tissue fluid collections. Renal atrophy with multiple bilateral simple cysts, consistent with history of end-stage renal disease. No follow-up imaging recommended. Atherosclerotic change noted within the aorta. Disc levels: L1-2: Diffuse disc bulge with endplate spurring. Mild facet hypertrophy. Unchanged mild narrowing of the lateral recesses. Central canal remains patent. Mild left L1 foraminal narrowing. Right neural foramina remains patent. L2-3: Mild disc bulge. Mild to moderate facet hypertrophy with ligament flavum thickening. Resultant mild-to-moderate canal with bilateral subarticular stenosis. Mild bilateral L2 foraminal narrowing. Appearance is relatively stable. L3-4: Diffuse disc bulge. Moderate facet and ligament flavum hypertrophy. Superimposed epidural phlegmon within the ventral epidural space at L3. Resultant severe spinal stenosis. Mild to moderate left with mild right L3 foraminal narrowing. Appearance is stable. L4-5: Severe intervertebral disc space narrowing with diffuse disc osteophyte complex. Superimposed small central disc protrusion with caudad angulation. Right worse than left facet hypertrophy. Moderate spinal stenosis with moderate to severe bilateral lateral recess narrowing, stable. Mild-to-moderate left L4 foraminal narrowing. Right neural foramen remains patent. L5-S1:  Transitional segment. No disc bulge or focal disc herniation. No stenosis. IMPRESSION: 1. Persistent findings concerning for osteomyelitis discitis at L2-3 and L4-5 with slight interval worsening in paravertebral inflammatory changes as compared to previous MRI from 11/30/2022. Superimposed 2.3 cm heterogeneous area of involving the left psoas muscle at the level of L3 likely reflects phlegmon. No drainable fluid collections identified. 2. Underlying moderate multilevel spondylosis and facet arthrosis with moderate to severe canal and lateral recess narrowing at L2-3 through L5-S1 as above. Appearance is not significantly changed as compared to prior. Electronically Signed   By: Jeannine Boga M.D.   On: 01/15/2023 21:59   DG Chest Port 1 View  Result Date: 01/15/2023 CLINICAL DATA:  Weakness and pain. EXAM: PORTABLE CHEST 1 VIEW COMPARISON:  11/30/2022 FINDINGS: Telemetry leads overlie the chest. UPPER limits normal heart size again noted. The mediastinal silhouette is unchanged. There is no evidence of focal airspace disease, pulmonary edema, suspicious pulmonary nodule/mass, pleural effusion, or pneumothorax. No acute bony abnormalities are identified. IMPRESSION: No active disease. Electronically Signed   By: Margarette Canada M.D.   On: 01/15/2023 13:17    Labs:  CBC: Recent Labs    01/17/23 1524 01/19/23 0949 01/21/23 0238 01/22/23 1011  WBC 8.8 8.2 7.1 7.3  HGB 9.6* 9.7* 9.6* 9.3*  HCT 27.9* 28.9* 28.5* 26.0*  PLT 329 401* 414* 336    COAGS: Recent Labs    06/12/22 1545 06/14/22 1304 06/22/22 2123 06/23/22 0445 06/23/22 1357 06/24/22 0435 09/16/22 2242 10/05/22 0042  INR 1.2  --   --   --   --   --  1.1 1.0  APTT 37*   < > 71* 64* 81* 72*  --   --    < > = values in this interval not displayed.    BMP: Recent Labs  01/17/23 1524 01/19/23 0949 01/21/23 0238 01/22/23 1011  NA 135 131* 134* 134*  K 4.2 4.2 4.3 4.7  CL 93* 88* 94* 95*  CO2 23 24 24 24  $ GLUCOSE  126* 83 107* 91  BUN 76* 54* 37* 53*  CALCIUM 9.1 9.8 9.6 9.2  CREATININE 10.98* 7.49* 5.73* 7.52*  GFRNONAA 3* 5* 7* 5*    LIVER FUNCTION TESTS: Recent Labs    09/16/22 2242 09/18/22 0721 09/27/22 0441 09/28/22 0650 11/29/22 2100 12/02/22 0540 01/15/23 1218 01/17/23 1524 01/19/23 0949 01/22/23 1011  BILITOT 0.7  --  0.6  --  0.3  --  1.0  --   --   --   AST 14*  --  17  --  15  --  31  --   --   --   ALT 10  --  8  --  8  --  6  --   --   --   ALKPHOS 40  --  53  --  48  --  52  --   --   --   PROT 6.5  --  6.4*  --  6.9  --  7.0  --   --   --   ALBUMIN 2.9*   < > 2.6*  2.7*   < > 2.9*   < > 2.6* 2.1* 2.2* 2.2*   < > = values in this interval not displayed.    TUMOR MARKERS: No results for input(s): "AFPTM", "CEA", "CA199", "CHROMGRNA" in the last 8760 hours.  Assessment and Plan: Left psoas abscess Patient with progressive left psoas abscess.  IR consulted for aspiration and drainage.  Case reviewed by Dr. Serafina Royals who approves patient for procedure.  She has been NPO.  Her last dose of Eliquis was last PM.  She is not alert or oriented today. Will reach out to family for consent.   Thank you for this interesting consult.  I greatly enjoyed meeting Diamond [name] and look forward to participating in their care.  A copy of this report was sent to the requesting provider on this date.  Electronically Signed: Docia Barrier, PA 01/24/2023, 2:06 PM   I spent a total of 20 Minutes    in face to face in clinical consultation, greater than 50% of which was counseling/coordinating care for left psoas abscess.

## 2023-01-24 NOTE — Progress Notes (Signed)
[facility] Progress Note   Subjective:    Seen and examined patient at bedside. Patient's son is also at bedside. Blood pressures very high this morning. Spoke to bedside RN. Apparently, she was very sleepy and in pain causing her not to take her BP meds. She appears more comfortable now. She denies SOB, CP, and N/V. She is scheduled for abscess drain by IR today. Also plan for HD thjis afternoon )hopefully she agrees to go). Family meeting with Palliative Care scheduled for tomorrow at 12pm.  Objective Vitals:   01/23/23 1927 01/24/23 0417 01/24/23 0649 01/24/23 0803  BP: (!) 164/98 (!) 198/111  (!) 200/103  Pulse: 74 71  72  Resp: 18 16    Temp: 98.6 F (37 C) 97.8 F (36.6 C)  97.6 F (36.4 C)  TempSrc: Oral Oral  Axillary  SpO2: 100% 99%  100%  Weight:   50.7 kg   Height:       Physical Exam General: Frail woman, NAD. Answers questions with eyes closed. Heart:RRR; 2/6 murmur Lungs: CTA anteriorly Abdomen: soft Extremities: no LE edema Dialysis Access: LUE AVF + bruit  Filed Weights   01/22/23 1252 01/22/23 1558 01/24/23 0649  Weight: 51.3 kg 50.9 kg 50.7 kg    Intake/Output Summary (Last 24 hours) at 01/24/2023 1056 Last data filed at 01/24/2023 0047 Gross per 24 hour  Intake 0 ml  Output --  Net 0 ml    Additional Objective Labs: Basic Metabolic Panel: Recent Labs  Lab 01/17/23 1524 01/19/23 0949 01/21/23 0238 01/22/23 1011  NA 135 131* 134* 134*  K 4.2 4.2 4.3 4.7  CL 93* 88* 94* 95*  CO2 23 24 24 24  $ GLUCOSE 126* 83 107* 91  BUN 76* 54* 37* 53*  CREATININE 10.98* 7.49* 5.73* 7.52*  CALCIUM 9.1 9.8 9.6 9.2  PHOS 8.3* 7.0*  --  6.6*   Liver Function Tests: Recent Labs  Lab 01/17/23 1524 01/19/23 0949 01/22/23 1011  ALBUMIN 2.1* 2.2* 2.2*   No results for input(s): "LIPASE", "AMYLASE" in the last 168 hours. CBC: Recent Labs  Lab 01/17/23 1524 01/19/23 0949 01/21/23 0238 01/22/23 1011  WBC 8.8 8.2 7.1 7.3  NEUTROABS  --    --  5.8  --   HGB 9.6* 9.7* 9.6* 9.3*  HCT 27.9* 28.9* 28.5* 26.0*  MCV 99.6 99.0 96.6 96.7  PLT 329 401* 414* 336   Blood Culture    Component Value Date/Time   SDES BLOOD RIGHT ARM 01/16/2023 1759   SDES BLOOD RIGHT ARM 01/16/2023 1759   SPECREQUEST  01/16/2023 1759    BOTTLES DRAWN AEROBIC AND ANAEROBIC Blood Culture results may not be optimal due to an inadequate volume of blood received in culture bottles   SPECREQUEST  01/16/2023 1759    BOTTLES DRAWN AEROBIC AND ANAEROBIC Blood Culture results may not be optimal due to an inadequate volume of blood received in culture bottles   CULT  01/16/2023 1759    NO GROWTH 5 DAYS Performed at [facility], [place] [address] [place], [place] [address]    CULT  01/16/2023 1759    NO GROWTH 5 DAYS Performed at [facility], [place] [address] [place], [place] [address]    REPTSTATUS 01/21/2023 FINAL 01/16/2023 1759   REPTSTATUS 01/21/2023 FINAL 01/16/2023 1759    Cardiac Enzymes: No results for input(s): "CKTOTAL", "CKMB", "CKMBINDEX", "TROPONINI" in the last 168 hours. CBG: No results for input(s): "GLUCAP" in the last 168 hours. Iron  Studies: No results for input(s): "IRON", "TIBC", "TRANSFERRIN", "FERRITIN" in the last 72 hours. Lab Results  Component Value Date   INR 1.0 10/05/2022   INR 1.1 09/16/2022   INR 1.2 06/12/2022   Studies/Results: MR LUMBAR SPINE WO CONTRAST  Result Date: 01/23/2023 CLINICAL DATA:  Follow-up epidural phlegmon EXAM: MRI LUMBAR SPINE WITHOUT CONTRAST TECHNIQUE: Multiplanar, multisequence MR imaging of the lumbar spine was performed. No intravenous contrast was administered. COMPARISON:  Eight days ago FINDINGS: Segmentation: Transitional lumbosacral vertebra numbered L5 based on priors. Alignment:  Physiologic. Vertebrae: There is a chronic fracture through the L3 body, also seen on in October 2023 study with fluid-filled fracture cleft. Paravertebral edema on both sides affecting the psoas with  left-sided partially septated collection measuring up to 2.5 cm. L2-3 disc hyperintensity without notable L2 marrow edema. Chronic superior endplate fracture at L2. There may be involvement of the right facet at L2-3 where there is foraminal cystic appearance posteriorly measuring 7 mm. Posteriorly there is epidural material at the level of L2-3 best seen on sagittal T2 weighted imaging, contributing to spinal stenosis. L4-5 disc collapse and endplate irregularity with mild FLAIR hyperintensity. Compared with prior CT this is more remote discitis/osteomyelitis with possible completed healing. Conus medullaris and cauda equina: Conus extends to the T12-L1 level. Conus and cauda equina appear normal. Paraspinal and other soft tissues: Paravertebral inflammation is noted above. Disc levels: T12- L1: Unremarkable. L1-L2: Mild disc narrowing and bulging L2-L3: Disc narrowing and bulging. Right foraminal herniation or purulence suspected there may be some erosion of the right facet within anterior 7 mm cystic density. Right foraminal impingement and advanced spinal stenosis. L3-L4: Narrowing and bulging with ligamentum flavum thickening. Advanced spinal stenosis. Moderate biforaminal stenosis. L4-L5: Disc collapse and endplate destruction as noted above. There is circumferential endplate ridging with high-grade spinal stenosis and bilateral L5 impingement at the subarticular recesses. L5-S1:Incomplete segmentation IMPRESSION: 1. Transitional L5 vertebra as previously numbered. 2. L2-3 discitis/osteomyelitis with purulence in a chronic L3 body fracture. Progression of left psoas involvement at this level to an abscess appearance measuring up to 2.5 cm. There may be facet arthritis on the right at this level. Posterior epidural phlegmon/collection combines with degeneration to cause high-grade thecal sac narrowing at this level. 3. L4-5 collapse and endplate destruction from more chronic discitis/osteomyelitis. 4. L3-4 and  L4-5 advanced degenerative spinal stenosis. Electronically Signed   By: Jorje Guild M.D.   On: 01/23/2023 09:16    Medications:  vancomycin Stopped (01/19/23 1204)    (feeding supplement) PROSource Plus  30 mL Oral BID BM   amiodarone  200 mg Oral BID   Followed by   Derrill Memo ON 01/27/2023] amiodarone  200 mg Oral Daily   apixaban  2.5 mg Oral Q12H   Chlorhexidine Gluconate Cloth  6 each Topical Q0600   metoprolol tartrate  50 mg Oral BID   pantoprazole  40 mg Oral Daily   sevelamer carbonate  1,600 mg Oral TID WC    Dialysis Orders: MWF at [facility] 4hr, 400/600, EDW 52.5kg, 2K/2Ca, LUE AVF, 15g needles, no heparin - Mircera 242mg IV q 2 weeks (last given 2/7) - no VDRA, binder is Renvela 2/meals.  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 TTE 2/12 with thickened aortic valve. Repeat LS MRI 2/17 showing persistent discitis/phlegmon + abscess. Apparently, she is scheduled for abscess drain by IR today. ESRD: Usual MWF schedule -  refused HD 2/16, dialyzed partially 2/17 - she signed off early. Next HD today - she is agreeable at this time. HTN/volume: BP high, she didn't take her medications this AM d/t being in pain, RN to try and give meds now. No LE edema and below prior EDW - lips are dry. UF as tolerated. Anemia of ESRD: Hgb 9.3 - will be due for ESA on 2/21 Secondary hyperparathyroidism: CorrCa high, no VDRA. Phos high - but coming down, continue Renvela as binder for now. Nutrition: Alb low, continue supplements. A-fib: On amiodarone + Eliquis GOC: Palliative care team has been consulted. Plan for family meeting tomorrow at [place], NP [facility] 01/24/2023,10:56 AM  LOS: 8 days

## 2023-01-25 ENCOUNTER — Inpatient Hospital Stay (HOSPITAL_COMMUNITY): Payer: Medicare (Managed Care)

## 2023-01-25 DIAGNOSIS — N186 End stage renal disease: Secondary | ICD-10-CM | POA: Diagnosis not present

## 2023-01-25 DIAGNOSIS — Z7189 Other specified counseling: Secondary | ICD-10-CM | POA: Diagnosis not present

## 2023-01-25 DIAGNOSIS — M8610 Other acute osteomyelitis, unspecified site: Secondary | ICD-10-CM | POA: Diagnosis not present

## 2023-01-25 DIAGNOSIS — Z515 Encounter for palliative care: Secondary | ICD-10-CM | POA: Diagnosis not present

## 2023-01-25 DIAGNOSIS — M4620 Osteomyelitis of vertebra, site unspecified: Secondary | ICD-10-CM | POA: Diagnosis not present

## 2023-01-25 DIAGNOSIS — M8660 Other chronic osteomyelitis, unspecified site: Secondary | ICD-10-CM | POA: Diagnosis not present

## 2023-01-25 LAB — RENAL FUNCTION PANEL
Albumin: 2.3 g/dL — ABNORMAL LOW (ref 3.5–5.0)
Anion gap: 12 (ref 5–15)
BUN: 26 mg/dL — ABNORMAL HIGH (ref 8–23)
CO2: 25 mmol/L (ref 22–32)
Calcium: 9.3 mg/dL (ref 8.9–10.3)
Chloride: 100 mmol/L (ref 98–111)
Creatinine, Ser: 4.4 mg/dL — ABNORMAL HIGH (ref 0.44–1.00)
GFR, Estimated: 10 mL/min — ABNORMAL LOW (ref >60)
Glucose, Bld: 118 mg/dL — ABNORMAL HIGH (ref 70–99)
Phosphorus: 4.4 mg/dL (ref 2.5–4.6)
Potassium: 3.6 mmol/L (ref 3.5–5.1)
Sodium: 137 mmol/L (ref 135–145)

## 2023-01-25 LAB — CBC
HCT: 32.1 % — ABNORMAL LOW (ref 36.0–46.0)
Hemoglobin: 10.3 g/dL — ABNORMAL LOW (ref 12.0–15.0)
MCH: 32.4 pg (ref 26.0–34.0)
MCHC: 32.1 g/dL (ref 30.0–36.0)
MCV: 100.9 fL — ABNORMAL HIGH (ref 80.0–100.0)
Platelets: 335 10*3/uL (ref 150–400)
RBC: 3.18 MIL/uL — ABNORMAL LOW (ref 3.87–5.11)
RDW: 19.9 % — ABNORMAL HIGH (ref 11.5–15.5)
WBC: 5.6 10*3/uL (ref 4.0–10.5)
nRBC: 0 % (ref 0.0–0.2)

## 2023-01-25 LAB — PROTIME-INR
INR: 1.2 (ref 0.8–1.2)
Prothrombin Time: 15.4 seconds — ABNORMAL HIGH (ref 11.4–15.2)

## 2023-01-25 MED ORDER — MIDAZOLAM HCL 2 MG/2ML IJ SOLN
INTRAMUSCULAR | Status: AC
  Filled 2023-01-25: qty 2

## 2023-01-25 MED ORDER — HEPARIN SODIUM (PORCINE) 1000 UNIT/ML DIALYSIS: 1000.0000 [IU] | INTRAMUSCULAR | Status: DC | PRN

## 2023-01-25 MED ORDER — LIDOCAINE HCL (PF) 1 % IJ SOLN: 5.0000 mL | INTRAMUSCULAR | Status: DC | PRN

## 2023-01-25 MED ORDER — FENTANYL CITRATE (PF) 100 MCG/2ML IJ SOLN
INTRAMUSCULAR | Status: AC
  Filled 2023-01-25: qty 2

## 2023-01-25 MED ORDER — FENTANYL CITRATE (PF) 100 MCG/2ML IJ SOLN
INTRAMUSCULAR | Status: AC | PRN
  Administered 2023-01-25: 25 ug via INTRAVENOUS

## 2023-01-25 MED ORDER — LIDOCAINE-PRILOCAINE 2.5-2.5 % EX CREA: 1.0000 | TOPICAL_CREAM | CUTANEOUS | Status: DC | PRN

## 2023-01-25 MED ORDER — ALTEPLASE 2 MG IJ SOLR: 2.0000 mg | Freq: Once | INTRAMUSCULAR | Status: DC | PRN

## 2023-01-25 MED ORDER — LIDOCAINE HCL 1 % IJ SOLN
10.0000 mL | Freq: Once | INTRAMUSCULAR | Status: DC
  Filled 2023-01-25: qty 10

## 2023-01-25 MED ORDER — PENTAFLUOROPROP-TETRAFLUOROETH EX AERO: 1.0000 | INHALATION_SPRAY | CUTANEOUS | Status: DC | PRN

## 2023-01-25 MED ORDER — MIDAZOLAM HCL 2 MG/2ML IJ SOLN
INTRAMUSCULAR | Status: AC | PRN
  Administered 2023-01-25: 1 mg via INTRAVENOUS

## 2023-01-25 NOTE — Progress Notes (Signed)
Palliative:  HPI: 72 y.o. 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   I had a meeting today with Diamond [name] along with multiple family members and her Bishop. We reviewed her overall health status and that she is more alert and in a better health state today but she has many severe chronic health conditions. We reviewed expectations for her osteomyelitis infection and that she will require lifelong antibiotic suppression and that this can worsen in the future as it has this time. We discussed dialysis and the importance of compliance to keep her feeling well. We discussed the importance of having antibiotics as well. We discussed that she will feel worse if she skips HD/antibiotics and will be at risk for significant health decline. This is is important if her goal is to continue full scope treatment. She confirms her goals for full code as well. I did explain that some patients do sometimes have changes to their wishes as their quality of life and symptom burden worsens. I also discussed the importance of HCPOA/Living Will and they do express interest in looking and completing. I provided copy of Advance Directive and reviewed with son Marchia Bond.   All questions/concerns addressed. Emotional support provided.   Exam: Alert, oriented. More interactive today. No distress. Generalized weakness and fatigue. Breathing regular, unlabored. Abd soft. Moves all extremities.   Plan: - Full code, full scope desired.  - Recommend completion of Advance Directive.   [place], NP Palliative Medicine Team Pager [phone] (Please see [email or web address] for schedule) Team Phone [phone]    Greater than 50%  of this time was spent counseling and  coordinating care related to the above assessment and plan

## 2023-01-25 NOTE — Procedures (Signed)
Interventional Radiology Procedure:   Indications: Left psoas abscess  Procedure: CT guided aspiration  Findings: Left psoas collection was barely visible with CT.  18 gauge needle placed at area of concern and 1 ml of brown/bloody purulent fluid aspirated.  Complications: No immediate complications noted.     EBL: Minimal  Plan: Fluid sent for culture. Bedrest 2 hours,   Llana Deshazo R. Anselm Pancoast, MD  Pager: [phone]

## 2023-01-25 NOTE — Progress Notes (Signed)
[facility] Progress Note   Subjective:    Seen and examined patient at bedside. Palliative Care NP and family also at bedside for scheduled family meeting today. S/p CT guided aspiration of left psoas abscess yesterday-fluid sent for culture. Tolerated yesterday's HD with net UF 1L. Next HD 01/26/23.  Objective Vitals:   01/25/23 0850 01/25/23 0855 01/25/23 0900 01/25/23 1142  BP: (!) 164/97 (!) 167/107 (!) 184/98 (!) 145/97  Pulse: 70 72 70   Resp: 16 15 17   $ Temp:    97.7 F (36.5 C)  TempSrc:    Oral  SpO2: 100% 100% 100%   Weight:      Height:       Physical Exam General: Frail woman, NAD. Answers questions with eyes closed. Heart:RRR; 2/6 murmur Lungs: CTA anteriorly Abdomen: soft Extremities: no LE edema Dialysis Access: LUE AVF + bruit  Filed Weights   01/24/23 0649 01/24/23 1430 01/24/23 1857  Weight: 50.7 kg 49.5 kg 48.5 kg    Intake/Output Summary (Last 24 hours) at 01/25/2023 1543 Last data filed at 01/24/2023 1857 Gross per 24 hour  Intake --  Output 1000 ml  Net -1000 ml    Additional Objective Labs: Basic Metabolic Panel: Recent Labs  Lab 01/19/23 0949 01/21/23 0238 01/22/23 1011 01/24/23 1452  NA 131* 134* 134* 136  K 4.2 4.3 4.7 4.6  CL 88* 94* 95* 96*  CO2 24 24 24 25  $ GLUCOSE 83 107* 91 88  BUN 54* 37* 53* 46*  CREATININE 7.49* 5.73* 7.52* 7.08*  CALCIUM 9.8 9.6 9.2 9.4  PHOS 7.0*  --  6.6* 6.1*   Liver Function Tests: Recent Labs  Lab 01/19/23 0949 01/22/23 1011 01/24/23 1452  ALBUMIN 2.2* 2.2* 2.1*   No results for input(s): "LIPASE", "AMYLASE" in the last 168 hours. CBC: Recent Labs  Lab 01/19/23 0949 01/21/23 0238 01/22/23 1011 01/24/23 1452 01/25/23 1448  WBC 8.2 7.1 7.3 4.9 5.6  NEUTROABS  --  5.8  --   --   --   HGB 9.7* 9.6* 9.3* 8.6* 10.3*  HCT 28.9* 28.5* 26.0* 26.4* 32.1*  MCV 99.0 96.6 96.7 100.0 100.9*  PLT 401* 414* 336 384 335   Blood Culture    Component Value Date/Time   SDES BLOOD  RIGHT ARM 01/16/2023 1759   SDES BLOOD RIGHT ARM 01/16/2023 1759   SPECREQUEST  01/16/2023 1759    BOTTLES DRAWN AEROBIC AND ANAEROBIC Blood Culture results may not be optimal due to an inadequate volume of blood received in culture bottles   SPECREQUEST  01/16/2023 1759    BOTTLES DRAWN AEROBIC AND ANAEROBIC Blood Culture results may not be optimal due to an inadequate volume of blood received in culture bottles   CULT  01/16/2023 1759    NO GROWTH 5 DAYS Performed at [facility], [place] [address], [place], [place] [address]    CULT  01/16/2023 1759    NO GROWTH 5 DAYS Performed at [facility], [place] [address] [place], [place] [address]    REPTSTATUS 01/21/2023 FINAL 01/16/2023 1759   REPTSTATUS 01/21/2023 FINAL 01/16/2023 1759    Cardiac Enzymes: No results for input(s): "CKTOTAL", "CKMB", "CKMBINDEX", "TROPONINI" in the last 168 hours. CBG: No results for input(s): "GLUCAP" in the last 168 hours. Iron Studies: No results for input(s): "IRON", "TIBC", "TRANSFERRIN", "FERRITIN" in the last 72 hours. Lab Results  Component Value Date   INR 1.2 01/25/2023   INR 1.0 10/05/2022   INR 1.1  09/16/2022   Studies/Results: CT GUIDED NEEDLE PLACEMENT  Result Date: 01/25/2023 INDICATION: 72 year old with discitis/osteomyelitis and concern for a left psoas abscess collection. EXAM: CT-GUIDED ASPIRATION OF LEFT PSOAS ABSCESS TECHNIQUE: Multidetector CT imaging of the abdomen and pelvis was performed following the standard protocol without IV contrast. RADIATION DOSE REDUCTION: This exam was performed according to the departmental dose-optimization program which includes automated exposure control, adjustment of the mA and/or kV according to patient size and/or use of iterative reconstruction technique. MEDICATIONS: Moderate sedation ANESTHESIA/SEDATION: Moderate (conscious) sedation was employed during this procedure. A total of Versed 1.0 mg and Fentanyl 25 mcg was administered  intravenously by the radiology nurse. Total intra-service moderate Sedation Time: 10 minutes. The patient's level of consciousness and vital signs were monitored continuously by radiology nursing throughout the procedure under my direct supervision. COMPLICATIONS: None immediate. PROCEDURE: Informed consent was obtained from the patient's family due to patient confusion. A timeout was performed prior to the initiation of the procedure. Patient was placed prone. CT images of the abdomen and pelvis were obtained. Area of concern in the left psoas muscle was targeted. The left side of the back was prepped with chlorhexidine and sterile field was created. Skin and soft tissues were anesthetized using 1% lidocaine. Small incision was made. Using CT guidance, an 18 gauge trocar needle was directed into the left psoas muscle at the area of concern. Approximately 1 mL of brown/bloody purulent fluid was aspirated. Needle was removed. Bandage placed over the puncture site. FINDINGS: Disease at the L2-L3 disc space was identified. The small fluid collections in left psoas muscle on the recent MRI were difficult to visualize with CT. Therefore, the left psoas muscle adjacent to the abnormal L2-L3 disc was targeted. Needle was directed into the central portion of the left psoas muscle and 1 mL of purulent fluid was aspirated. Fluid was sent for culture. IMPRESSION: Successful CT-guided aspiration of the left psoas abscess. 1 mL of purulent fluid was removed. Electronically Signed   By: Markus Daft M.D.   On: 01/25/2023 12:34    Medications:  vancomycin 500 mg (01/25/23 0037)    (feeding supplement) PROSource Plus  30 mL Oral BID BM   amiodarone  200 mg Oral BID   Followed by   Derrill Memo ON 01/27/2023] amiodarone  200 mg Oral Daily   apixaban  2.5 mg Oral Q12H   Chlorhexidine Gluconate Cloth  6 each Topical Q0600   lidocaine  10 mL Intradermal Once   metoprolol tartrate  50 mg Oral BID   pantoprazole  40 mg Oral Daily    sevelamer carbonate  1,600 mg Oral TID WC    Dialysis Orders: MWF at [facility] 4hr, 400/600, EDW 52.5kg, 2K/2Ca, LUE AVF, 15g needles, no heparin - Mircera 211mg IV q 2 weeks (last given 2/7) - no VDRA, binder is Renvela 2/meals.  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 TTE 2/12 with thickened aortic valve. Repeat LS MRI 2/17 showing persistent discitis/phlegmon + abscess. S/p CT guided aspiration abscess by IR 2/19. ESRD: Usual MWF schedule - refused HD 2/16, dialyzed partially 2/17 - she signed off early. She tolerated HD yesterday. Next HD 01/26/23. HTN/volume: BP improved after she took her scheduled medications. No LE edema and below prior EDW - lips are dry. UF as tolerated. Anemia of ESRD: Hgb 9.3 - will be due  for ESA on 2/21 Secondary hyperparathyroidism: CorrCa high, no VDRA. Phos high - but coming down, continue Renvela as binder for now. Nutrition: Alb low, continue supplements. A-fib: On amiodarone + Eliquis GOC: Family meeting with Palliative Care scheduled for this afternoon.   Tobie Poet, NP [facility] 01/25/2023,3:43 PM  LOS: 9 days

## 2023-01-25 NOTE — Progress Notes (Signed)
OT Cancellation Note  Patient Details Name: TRIMEKA GANDER MRN: [id number] DOB: 05/09/1951   Cancelled Treatment:    Reason Eval/Treat Not Completed: Patient not medically ready (Active bedrest order after IR procedure, pt on bed rest. Will check back as time and schedule allow.)  Almyra Deforest, OTR/L 01/25/2023, 10:13 AM

## 2023-01-25 NOTE — Progress Notes (Signed)
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  MRI lumbar spine showed persistent findings concerning for osteomyelitis and discitis at L2 and 3, L4 and 5 with slight interval worsening of paravertebral inflammatory changes as compared to previous MRI.  2.3 cm heterogeneous area of involving left psoas muscle at the level of L3 likely phlegmon.  No drainable abscess.  Admitted with ID consultation.   Assessment & Plan:   Recurrent MRSA bacteremia, 12/2021, 06/2022, 09/2022. 2/10, blood cultures with MRSA. 2/11, blood cultures negative so far. 2/12, TTE with thickened aortic valve. 1/17, 2.1 x 1.37 multilobulated mobile mass attached to the RA/IVC junction. MRI on 2/10, Persistent osteomyelitis discitis with left psoas muscle phlegmon of 2.3 cm  MRI 2/18, persistent osteomyelitis and discitis, 2.5 cm abscess on the L3 level/left psoas abscess. IR 2/20, attempted aspiration.  Only 1 mL of brown bloody purulent fluid aspirated.  Sent for culture.  Repeat cultures cleared.  Remains on vancomycin with hemodialysis.  Plan to continue vancomycin likely lifelong. ID is following. Patient had plans for TEE, high risk of decompensation on anesthesia.  TEE was not pursued after discussing with family as it is not going to change any management, apparently patient should be on suppressive antibiotic therapy for lifelong.  Paroxysmal atrial flutter: Now rate controlled on amiodarone 200 mg twice daily, metoprolol 50 mg twice daily.  She is therapeutic on Eliquis.  ESRD on hemodialysis: Getting dialysis in  the hospital.  Occasional difficulty with tolerating dialysis.  Acute metabolic infective encephalopathy: As above.  Mental status improving but fluctuates.  Goal of care: Multiple difficulty to treat conditions and intolerance to dialysis. Family agreeable to meet with palliative care.  Consulted.  Stable to transfer to a SNF tomorrow.  Will monitor today after procedure.  DVT prophylaxis: apixaban (ELIQUIS) tablet 2.5 mg Start: 01/19/23 1800 apixaban (ELIQUIS) tablet 2.5 mg   Code Status: Full code Family Communication: None at the bedside.  Called patient's son, will call again later. Disposition Plan: Status is: Inpatient Remains inpatient appropriate because: Needs a skilled nursing facility.     Consultants:  Nephrology Cardiology Palliative  Procedures:  None  Antimicrobials:  Vancomycin 2/10---   Subjective:  Patient seen and examined.  Came back from procedure.  Closed eyes.  She tells me she is fine.  Does not want to engage in conversation but keeps her eyes closed.  Denies any pain.  Objective: Vitals:   01/25/23 0840 01/25/23 0850 01/25/23 0855 01/25/23 0900  BP: (!) 144/97 (!) 164/97 (!) 167/107 (!) 184/98  Pulse: 67 70 72 70  Resp: 16 16 15 17  $ Temp:      TempSrc:      SpO2: 100% 100% 100% 100%  Weight:      Height:        Intake/Output Summary (Last 24 hours) at 01/25/2023 1049 Last data filed at 01/24/2023 1857 Gross per 24 hour  Intake --  Output 1000 ml  Net -1000 ml   Filed Weights   01/24/23 0649 01/24/23 1430 01/24/23 1857  Weight: 50.7 kg 49.5 kg 48.5 kg    Examination:  General exam: Appears calm and comfortable.  Flat affect.  Frail debilitated. Very uninterested in conversation. Respiratory system: No added sounds. Cardiovascular system: S1 & S2 heard, RRR. No pedal edema. Gastrointestinal system: Soft.  Nontender.  Bowel sound present.   Central nervous system: Alert and awake on interaction.  Flat affect.   Extremities:  Moves all extremities.  Left upper extremity AV fistula present.    Data Reviewed: I have personally reviewed following labs and imaging studies  CBC: Recent Labs  Lab 01/19/23 0949 01/21/23 0238 01/22/23 1011 01/24/23 1452  WBC 8.2 7.1 7.3 4.9  NEUTROABS  --  5.8  --   --   HGB 9.7* 9.6* 9.3* 8.6*  HCT 28.9* 28.5* 26.0* 26.4*  MCV 99.0 96.6 96.7 100.0  PLT 401* 414* 336 0000000   Basic Metabolic Panel: Recent Labs  Lab 01/19/23 0949 01/21/23 0238 01/22/23 1011 01/24/23 1452  NA 131* 134* 134* 136  K 4.2 4.3 4.7 4.6  CL 88* 94* 95* 96*  CO2 24 24 24 25  $ GLUCOSE 83 107* 91 88  BUN 54* 37* 53* 46*  CREATININE 7.49* 5.73* 7.52* 7.08*  CALCIUM 9.8 9.6 9.2 9.4  PHOS 7.0*  --  6.6* 6.1*   GFR: Estimated Creatinine Clearance: 5.4 mL/min (A) (by C-G formula based on SCr of 7.08 mg/dL (H)). Liver Function Tests: Recent Labs  Lab 01/19/23 0949 01/22/23 1011 01/24/23 1452  ALBUMIN 2.2* 2.2* 2.1*   No results for input(s): "LIPASE", "AMYLASE" in the last 168 hours. No results for input(s): "AMMONIA" in the last 168 hours. Coagulation Profile: No results for input(s): "INR", "PROTIME" in the last 168 hours. Cardiac Enzymes: No results for input(s): "CKTOTAL", "CKMB", "CKMBINDEX", "TROPONINI" in the last 168 hours. BNP (last 3 results) No results for input(s): "PROBNP" in the last 8760 hours. HbA1C: No results for input(s): "HGBA1C" in the last 72 hours. CBG: No results for input(s): "GLUCAP" in the last 168 hours. Lipid Profile: No results for input(s): "CHOL", "HDL", "LDLCALC", "TRIG", "CHOLHDL", "LDLDIRECT" in the last 72 hours. Thyroid Function Tests: No results for input(s): "TSH", "T4TOTAL", "FREET4", "T3FREE", "THYROIDAB" in the last 72 hours. Anemia Panel: No results for input(s): "VITAMINB12", "FOLATE", "FERRITIN", "TIBC", "IRON", "RETICCTPCT" in the last 72 hours. Sepsis Labs: No results for input(s): "PROCALCITON", "LATICACIDVEN" in the last 168  hours.   Recent Results (from the past 240 hour(s))  Blood culture (routine x 2)     Status: Abnormal   Collection Time: 01/15/23  2:25 PM   Specimen: BLOOD  Result Value Ref Range Status   Specimen Description BLOOD SITE NOT SPECIFIED  Final   Special Requests   Final    BOTTLES DRAWN AEROBIC AND ANAEROBIC Blood Culture adequate volume   Culture  Setup Time   Final    ANAEROBIC BOTTLE ONLY GRAM POSITIVE COCCI IN CLUSTERS Organism ID to follow CRITICAL RESULT CALLED TO, READ BACK BY AND VERIFIED WITH:  C/ PHARMD C. PIERCE 01/16/23 1620 A. LAFRANCE Performed at [facility], [place] [address], [place], [place] [address]    Culture METHICILLIN RESISTANT STAPHYLOCOCCUS AUREUS (A)  Final   Report Status 01/18/2023 FINAL  Final   Organism ID, Bacteria METHICILLIN RESISTANT STAPHYLOCOCCUS AUREUS  Final      Susceptibility   Methicillin resistant staphylococcus aureus - MIC*    CIPROFLOXACIN <=0.5 SENSITIVE Sensitive     ERYTHROMYCIN <=0.25 SENSITIVE Sensitive     GENTAMICIN <=0.5 SENSITIVE Sensitive     OXACILLIN >=4 RESISTANT Resistant     TETRACYCLINE >=16 RESISTANT Resistant  VANCOMYCIN 1 SENSITIVE Sensitive     TRIMETH/SULFA <=10 SENSITIVE Sensitive     CLINDAMYCIN <=0.25 SENSITIVE Sensitive     RIFAMPIN <=0.5 SENSITIVE Sensitive     Inducible Clindamycin NEGATIVE Sensitive     * METHICILLIN RESISTANT STAPHYLOCOCCUS AUREUS  Blood Culture ID Panel (Reflexed)     Status: Abnormal   Collection Time: 01/15/23  2:25 PM  Result Value Ref Range Status   Enterococcus faecalis NOT DETECTED NOT DETECTED Final   Enterococcus Faecium NOT DETECTED NOT DETECTED Final   Listeria monocytogenes NOT DETECTED NOT DETECTED Final   Staphylococcus species DETECTED (A) NOT DETECTED Final    Comment: CRITICAL RESULT CALLED TO, READ BACK BY AND VERIFIED WITH:  C/ PHARMD C. PIERCE 01/16/23 1620 A. LAFRANCE    Staphylococcus aureus (BCID) DETECTED (A) NOT DETECTED Final    Comment: Methicillin  (oxacillin)-resistant Staphylococcus aureus (MRSA). MRSA is predictably resistant to beta-lactam antibiotics (except ceftaroline). Preferred therapy is vancomycin unless clinically contraindicated. Patient requires contact precautions if  hospitalized. CRITICAL RESULT CALLED TO, READ BACK BY AND VERIFIED WITH:  C/ PHARMD C. PIERCE 01/16/23 1620 A. LAFRANCE    Staphylococcus epidermidis NOT DETECTED NOT DETECTED Final   Staphylococcus lugdunensis NOT DETECTED NOT DETECTED Final   Streptococcus species NOT DETECTED NOT DETECTED Final   Streptococcus agalactiae NOT DETECTED NOT DETECTED Final   Streptococcus pneumoniae NOT DETECTED NOT DETECTED Final   Streptococcus pyogenes NOT DETECTED NOT DETECTED Final   A.calcoaceticus-baumannii NOT DETECTED NOT DETECTED Final   Bacteroides fragilis NOT DETECTED NOT DETECTED Final   Enterobacterales NOT DETECTED NOT DETECTED Final   Enterobacter cloacae complex NOT DETECTED NOT DETECTED Final   Escherichia coli NOT DETECTED NOT DETECTED Final   Klebsiella aerogenes NOT DETECTED NOT DETECTED Final   Klebsiella oxytoca NOT DETECTED NOT DETECTED Final   Klebsiella pneumoniae NOT DETECTED NOT DETECTED Final   Proteus species NOT DETECTED NOT DETECTED Final   Salmonella species NOT DETECTED NOT DETECTED Final   Serratia marcescens NOT DETECTED NOT DETECTED Final   Haemophilus influenzae NOT DETECTED NOT DETECTED Final   Neisseria meningitidis NOT DETECTED NOT DETECTED Final   Pseudomonas aeruginosa NOT DETECTED NOT DETECTED Final   Stenotrophomonas maltophilia NOT DETECTED NOT DETECTED Final   Candida albicans NOT DETECTED NOT DETECTED Final   Candida auris NOT DETECTED NOT DETECTED Final   Candida glabrata NOT DETECTED NOT DETECTED Final   Candida krusei NOT DETECTED NOT DETECTED Final   Candida parapsilosis NOT DETECTED NOT DETECTED Final   Candida tropicalis NOT DETECTED NOT DETECTED Final   Cryptococcus neoformans/gattii NOT DETECTED NOT DETECTED  Final   Meth resistant mecA/C and MREJ DETECTED (A) NOT DETECTED Final    Comment: CRITICAL RESULT CALLED TO, READ BACK BY AND VERIFIED WITH:  C/ PHARMD C. PIERCE 01/16/23 1620 A. LAFRANCE Performed at [facility], [place] [address], [place], [place] [address]   Culture, blood (Routine X 2) w Reflex to ID Panel     Status: None   Collection Time: 01/16/23  5:59 PM   Specimen: BLOOD RIGHT ARM  Result Value Ref Range Status   Specimen Description BLOOD RIGHT ARM  Final   Special Requests   Final    BOTTLES DRAWN AEROBIC AND ANAEROBIC Blood Culture results may not be optimal due to an inadequate volume of blood received in culture bottles   Culture   Final    NO GROWTH 5 DAYS Performed at [facility], [place] [address], [place],  [address]  Report Status 01/21/2023 FINAL  Final  Culture, blood (Routine X 2) w Reflex to ID Panel     Status: None   Collection Time: 01/16/23  5:59 PM   Specimen: BLOOD RIGHT ARM  Result Value Ref Range Status   Specimen Description BLOOD RIGHT ARM  Final   Special Requests   Final    BOTTLES DRAWN AEROBIC AND ANAEROBIC Blood Culture results may not be optimal due to an inadequate volume of blood received in culture bottles   Culture   Final    NO GROWTH 5 DAYS Performed at [facility], [place] [address], [place], [place] [address]    Report Status 01/21/2023 FINAL  Final         Radiology Studies: No results found.      Scheduled Meds:  (feeding supplement) PROSource Plus  30 mL Oral BID BM   amiodarone  200 mg Oral BID   Followed by   Derrill Memo ON 01/27/2023] amiodarone  200 mg Oral Daily   apixaban  2.5 mg Oral Q12H   Chlorhexidine Gluconate Cloth  6 each Topical Q0600   lidocaine  10 mL Intradermal Once   metoprolol tartrate  50 mg Oral BID   pantoprazole  40 mg Oral Daily   sevelamer carbonate  1,600 mg Oral TID WC   Continuous Infusions:  vancomycin 500 mg (01/25/23 0037)     LOS: 9 days    Time spent: 35  minutes    Barb Merino, MD [facility] Pager [phone]

## 2023-01-25 NOTE — Progress Notes (Signed)
PT Cancellation Note  Patient Details Name: Diamond [name] MRN: [id number] DOB: 1951-10-31   Cancelled Treatment:    Reason Eval/Treat Not Completed: (P) Active bedrest order (after IR procedure, pt on bed rest 2 hrs (from ~9am per MD note).) Will continue efforts per PT plan of care as schedule permits.   Kara Pacer Shyvonne Chastang 01/25/2023, 9:55 AM

## 2023-01-25 NOTE — Progress Notes (Signed)
[facility] for Infectious Disease  Date of Admission:  01/15/2023           Reason for visit: Follow up on recurrent MRSA bacteremia  Current antibiotics: Vancomycin  ASSESSMENT:    72 y.o. female admitted with:  Recurrent MRSA bacteremia: Patient has had episodes of bacteremia in January 2023, July 2023, October 2023 and is again admitted with recurrent bacteremia with positive blood cultures from 01/15/2023.  Repeat cultures 01/16/2023 finalized as no growth.  TTE showed mild thickening of the aortic valve.  TEE not pursued as unlikely to change any management. Persistent discitis/osteomyelitis: At L2-3 and L4-5 with paravertebral inflammatory changes and superimposed phlegmon. Left psoas abscess: Noted to have progression on repeat MRI 01/23/2023.  IR attempted to place a drain today but collection was barely visible.  This was aspirated, but no drain placed. End-stage renal disease: On hemodialysis via left aVF.  No signs of infection at this site and has been evaluated by vascular surgery with no concerns. Chronic hepatitis C: RNA detectable in August 2023 and awaiting treatment as an outpatient.  RECOMMENDATIONS:    Continue vancomycin with HD.  Tentatively planning for 8 weeks from negative blood cultures on 01/16/2023 followed by indefinite oral antibiotic suppression Follow-up psoas abscess aspiration cultures from today Will continue to follow   Principal Problem:   Acute on chronic osteomyelitis ([place]) Active Problems:   Hypertension   ESRD on hemodialysis (HCC)   Discitis of lumbar region   Paroxysmal atrial flutter (HCC)   Acute metabolic encephalopathy   Metabolic acidosis    MEDICATIONS:    Scheduled Meds:  (feeding supplement) PROSource Plus  30 mL Oral BID BM   amiodarone  200 mg Oral BID   Followed by   Derrill Memo ON 01/27/2023] amiodarone  200 mg Oral Daily   apixaban  2.5 mg Oral Q12H   Chlorhexidine Gluconate Cloth  6 each Topical Q0600    lidocaine  10 mL Intradermal Once   metoprolol tartrate  50 mg Oral BID   pantoprazole  40 mg Oral Daily   sevelamer carbonate  1,600 mg Oral TID WC   Continuous Infusions:  vancomycin 500 mg (01/25/23 0037)   PRN Meds:.cloNIDine, HYDROcodone-acetaminophen, morphine injection, nitroGLYCERIN  SUBJECTIVE:   24 hour events:  No acute events IR attempted to aspirate her psoas abscess today She says this was painful She has no other complaints at this time.    Review of Systems  All other systems reviewed and are negative.     OBJECTIVE:   Blood pressure (!) 184/98, pulse 70, temperature 98.4 F (36.9 C), temperature source Oral, resp. rate 17, height 5' 6"$  (1.676 m), weight 48.5 kg, SpO2 100 %. Body mass index is 17.26 kg/m.  Physical Exam Constitutional:      General: She is not in acute distress. HENT:     Head: Normocephalic and atraumatic.  Eyes:     Extraocular Movements: Extraocular movements intact.     Conjunctiva/sclera: Conjunctivae normal.  Pulmonary:     Effort: Pulmonary effort is normal. No respiratory distress.  Abdominal:     General: There is no distension.     Palpations: Abdomen is soft.  Musculoskeletal:        General: Normal range of motion.     Cervical back: Normal range of motion and neck supple.  Skin:    General: Skin is warm and dry.  Neurological:     General: No focal deficit present.  Mental Status: She is alert.  Psychiatric:        Mood and Affect: Mood normal.        Behavior: Behavior normal.      Lab Results: Lab Results  Component Value Date   WBC 4.9 01/24/2023   HGB 8.6 (L) 01/24/2023   HCT 26.4 (L) 01/24/2023   MCV 100.0 01/24/2023   PLT 384 01/24/2023    Lab Results  Component Value Date   NA 136 01/24/2023   K 4.6 01/24/2023   CO2 25 01/24/2023   GLUCOSE 88 01/24/2023   BUN 46 (H) 01/24/2023   CREATININE 7.08 (H) 01/24/2023   CALCIUM 9.4 01/24/2023   GFRNONAA 6 (L) 01/24/2023   GFRAA 14 (L)  11/12/2019    Lab Results  Component Value Date   ALT 6 01/15/2023   AST 31 01/15/2023   ALKPHOS 52 01/15/2023   BILITOT 1.0 01/15/2023       Component Value Date/Time   CRP 33.8 (H) 01/15/2023 1218       Component Value Date/Time   ESRSEDRATE 133 (H) 01/15/2023 1218     I have reviewed the micro and lab results in Epic.  Imaging: No results found.   Imaging independently reviewed in Epic.    Raynelle Highland for Infectious Disease [facility] [phone] pager 01/25/2023, 11:25 AM  I have personally spent 50 minutes involved in face-to-face and non-face-to-face activities for this patient on the day of the visit. Professional time spent includes the following activities: Preparing to see the patient (review of tests), Obtaining and/or reviewing separately obtained history (admission/discharge record), Performing a medically appropriate examination and/or evaluation , Ordering medications/tests/procedures, referring and communicating with other health care professionals, Documenting clinical information in the EMR, Independently interpreting results (not separately reported), Communicating results to the patient/family/caregiver, Counseling and educating the patient/family/caregiver and Care coordination (not separately reported).

## 2023-01-26 DIAGNOSIS — M4620 Osteomyelitis of vertebra, site unspecified: Secondary | ICD-10-CM | POA: Diagnosis not present

## 2023-01-26 DIAGNOSIS — Z992 Dependence on renal dialysis: Secondary | ICD-10-CM | POA: Diagnosis not present

## 2023-01-26 DIAGNOSIS — N186 End stage renal disease: Secondary | ICD-10-CM | POA: Diagnosis not present

## 2023-01-26 DIAGNOSIS — M8610 Other acute osteomyelitis, unspecified site: Secondary | ICD-10-CM | POA: Diagnosis not present

## 2023-01-26 DIAGNOSIS — M8660 Other chronic osteomyelitis, unspecified site: Secondary | ICD-10-CM | POA: Diagnosis not present

## 2023-01-26 DIAGNOSIS — R7881 Bacteremia: Secondary | ICD-10-CM | POA: Diagnosis not present

## 2023-01-26 LAB — RENAL FUNCTION PANEL
Albumin: 2.3 g/dL — ABNORMAL LOW (ref 3.5–5.0)
Anion gap: 12 (ref 5–15)
BUN: 32 mg/dL — ABNORMAL HIGH (ref 8–23)
CO2: 25 mmol/L (ref 22–32)
Calcium: 9.5 mg/dL (ref 8.9–10.3)
Chloride: 99 mmol/L (ref 98–111)
Creatinine, Ser: 5.41 mg/dL — ABNORMAL HIGH (ref 0.44–1.00)
GFR, Estimated: 8 mL/min — ABNORMAL LOW (ref >60)
Glucose, Bld: 98 mg/dL (ref 70–99)
Phosphorus: 4.9 mg/dL — ABNORMAL HIGH (ref 2.5–4.6)
Potassium: 3.7 mmol/L (ref 3.5–5.1)
Sodium: 136 mmol/L (ref 135–145)

## 2023-01-26 LAB — CBC
HCT: 25.4 % — ABNORMAL LOW (ref 36.0–46.0)
Hemoglobin: 8.6 g/dL — ABNORMAL LOW (ref 12.0–15.0)
MCH: 33.1 pg (ref 26.0–34.0)
MCHC: 33.9 g/dL (ref 30.0–36.0)
MCV: 97.7 fL (ref 80.0–100.0)
Platelets: 353 10*3/uL (ref 150–400)
RBC: 2.6 MIL/uL — ABNORMAL LOW (ref 3.87–5.11)
RDW: 20.1 % — ABNORMAL HIGH (ref 11.5–15.5)
WBC: 5.1 10*3/uL (ref 4.0–10.5)
nRBC: 0 % (ref 0.0–0.2)

## 2023-01-26 LAB — GLUCOSE, CAPILLARY: Glucose-Capillary: 76 mg/dL (ref 70–99)

## 2023-01-26 MED ORDER — HYDROCODONE-ACETAMINOPHEN 5-325 MG PO TABS: 1.0000 | ORAL_TABLET | Freq: Four times a day (QID) | ORAL | 0 refills | Status: DC | PRN

## 2023-01-26 MED ORDER — AMIODARONE HCL 200 MG PO TABS: 200.0000 mg | ORAL_TABLET | Freq: Every day | ORAL | Status: AC

## 2023-01-26 MED ORDER — METOPROLOL TARTRATE 50 MG PO TABS: 50.0000 mg | ORAL_TABLET | Freq: Two times a day (BID) | ORAL | Status: AC

## 2023-01-26 MED ORDER — DARBEPOETIN ALFA 150 MCG/0.3ML IJ SOSY
150.0000 ug | PREFILLED_SYRINGE | INTRAMUSCULAR | Status: DC
  Filled 2023-01-26: qty 0.3

## 2023-01-26 MED ORDER — HYDROCODONE-ACETAMINOPHEN 5-325 MG PO TABS: 1.0000 | ORAL_TABLET | Freq: Four times a day (QID) | ORAL | 0 refills | Status: AC | PRN

## 2023-01-26 NOTE — Progress Notes (Signed)
[facility] Progress Note   Subjective:    Seen and examined patient on HD. Informed by HD staff of patient being restless and agitated, saying she wants to come off HD machine. Spoke to Diamond [name]: she states: "I want to come off the machine and you can't tell me what to do". Noted she was attempting to pull off her leads. Discussed with HD RN and mittens were placed for safety. We will do our best to dialyze her as much as we can today. UFG set 1L. Patient has refused HD in the past and her mental status "waxes and wanes". A family meeting was conducted by Palliative Care yesterday afternoon. I was present during some of the conversation. Patient stated during that discussion that she wanted to continue living, continue with hemodialysis, and spend as much time with her children/grand-children as she can.   Objective Vitals:   01/26/23 0829 01/26/23 0900 01/26/23 0930 01/26/23 1000  BP: (!) 172/98 (!) 161/93 (!) 178/99 (!) 163/105  Pulse: 60  60 61  Resp: 16   14  Temp:      TempSrc:      SpO2: 100%   100%  Weight:      Height:       Physical Exam General: Frail woman, agitated, NAD, on RA. Heart:RRR; 2/6 murmur Lungs: CTA anteriorly Abdomen: soft Extremities: no LE edema Dialysis Access: LUE AVF + bruit  Filed Weights   01/24/23 1430 01/24/23 1857 01/26/23 0755  Weight: 49.5 kg 48.5 kg 53 kg   No intake or output data in the 24 hours ending 01/26/23 1034  Additional Objective Labs: Basic Metabolic Panel: Recent Labs  Lab 01/24/23 1452 01/25/23 1726 01/26/23 0826  NA 136 137 136  K 4.6 3.6 3.7  CL 96* 100 99  CO2 25 25 25  $ GLUCOSE 88 118* 98  BUN 46* 26* 32*  CREATININE 7.08* 4.40* 5.41*  CALCIUM 9.4 9.3 9.5  PHOS 6.1* 4.4 4.9*   Liver Function Tests: Recent Labs  Lab 01/24/23 1452 01/25/23 1726 01/26/23 0826  ALBUMIN 2.1* 2.3* 2.3*   No results for input(s): "LIPASE", "AMYLASE" in the last 168 hours. CBC: Recent Labs  Lab 01/21/23 0238  01/22/23 1011 01/24/23 1452 01/25/23 1448 01/26/23 0826  WBC 7.1 7.3 4.9 5.6 5.1  NEUTROABS 5.8  --   --   --   --   HGB 9.6* 9.3* 8.6* 10.3* 8.6*  HCT 28.5* 26.0* 26.4* 32.1* 25.4*  MCV 96.6 96.7 100.0 100.9* 97.7  PLT 414* 336 384 335 353   Blood Culture    Component Value Date/Time   SDES BLOOD RIGHT ARM 01/16/2023 1759   SDES BLOOD RIGHT ARM 01/16/2023 1759   SPECREQUEST  01/16/2023 1759    BOTTLES DRAWN AEROBIC AND ANAEROBIC Blood Culture results may not be optimal due to an inadequate volume of blood received in culture bottles   SPECREQUEST  01/16/2023 1759    BOTTLES DRAWN AEROBIC AND ANAEROBIC Blood Culture results may not be optimal due to an inadequate volume of blood received in culture bottles   CULT  01/16/2023 1759    NO GROWTH 5 DAYS Performed at [facility], [place] [address] [place], [place] [address]    CULT  01/16/2023 1759    NO GROWTH 5 DAYS Performed at [facility], [place] [address] [place], [place] [address]    REPTSTATUS 01/21/2023 FINAL 01/16/2023 1759   REPTSTATUS 01/21/2023 FINAL 01/16/2023 1759    Cardiac  Enzymes: No results for input(s): "CKTOTAL", "CKMB", "CKMBINDEX", "TROPONINI" in the last 168 hours. CBG: No results for input(s): "GLUCAP" in the last 168 hours. Iron Studies: No results for input(s): "IRON", "TIBC", "TRANSFERRIN", "FERRITIN" in the last 72 hours. Lab Results  Component Value Date   INR 1.2 01/25/2023   INR 1.0 10/05/2022   INR 1.1 09/16/2022   Studies/Results: CT GUIDED NEEDLE PLACEMENT  Result Date: 01/25/2023 INDICATION: 72 year old with discitis/osteomyelitis and concern for a left psoas abscess collection. EXAM: CT-GUIDED ASPIRATION OF LEFT PSOAS ABSCESS TECHNIQUE: Multidetector CT imaging of the abdomen and pelvis was performed following the standard protocol without IV contrast. RADIATION DOSE REDUCTION: This exam was performed according to the departmental dose-optimization program which includes  automated exposure control, adjustment of the mA and/or kV according to patient size and/or use of iterative reconstruction technique. MEDICATIONS: Moderate sedation ANESTHESIA/SEDATION: Moderate (conscious) sedation was employed during this procedure. A total of Versed 1.0 mg and Fentanyl 25 mcg was administered intravenously by the radiology nurse. Total intra-service moderate Sedation Time: 10 minutes. The patient's level of consciousness and vital signs were monitored continuously by radiology nursing throughout the procedure under my direct supervision. COMPLICATIONS: None immediate. PROCEDURE: Informed consent was obtained from the patient's family due to patient confusion. A timeout was performed prior to the initiation of the procedure. Patient was placed prone. CT images of the abdomen and pelvis were obtained. Area of concern in the left psoas muscle was targeted. The left side of the back was prepped with chlorhexidine and sterile field was created. Skin and soft tissues were anesthetized using 1% lidocaine. Small incision was made. Using CT guidance, an 18 gauge trocar needle was directed into the left psoas muscle at the area of concern. Approximately 1 mL of brown/bloody purulent fluid was aspirated. Needle was removed. Bandage placed over the puncture site. FINDINGS: Disease at the L2-L3 disc space was identified. The small fluid collections in left psoas muscle on the recent MRI were difficult to visualize with CT. Therefore, the left psoas muscle adjacent to the abnormal L2-L3 disc was targeted. Needle was directed into the central portion of the left psoas muscle and 1 mL of purulent fluid was aspirated. Fluid was sent for culture. IMPRESSION: Successful CT-guided aspiration of the left psoas abscess. 1 mL of purulent fluid was removed. Electronically Signed   By: Markus Daft M.D.   On: 01/25/2023 12:34    Medications:  vancomycin 500 mg (01/25/23 0037)    (feeding supplement) PROSource Plus   30 mL Oral BID BM   amiodarone  200 mg Oral BID   Followed by   Derrill Memo ON 01/27/2023] amiodarone  200 mg Oral Daily   apixaban  2.5 mg Oral Q12H   Chlorhexidine Gluconate Cloth  6 each Topical Q0600   lidocaine  10 mL Intradermal Once   metoprolol tartrate  50 mg Oral BID   pantoprazole  40 mg Oral Daily   sevelamer carbonate  1,600 mg Oral TID WC    Dialysis Orders: MWF at [facility] 4hr, 400/600, EDW 52.5kg, 2K/2Ca, LUE AVF, 15g needles, no heparin - Mircera 280mg IV q 2 weeks (last given 2/7) - no VDRA, binder is Renvela 2/meals.  Assessment/Plan: Recurrent MRSA bacteremia/persistent osteomyelitis/discitis of L2-3 and L4-5 with phlegmon: ID following, plan is for 8 weeks of IV Vanc 5074mwith HD MWF (from negative blood cultures on 01/16/23) with end date 03/13/23 then lifelong oral suppressive antibiotics. TTE 2/12 with thickened aortic valve. Repeat LS MRI 2/17 showing  persistent discitis/phlegmon + abscess. S/p CT guided aspiration abscess by IR 2/19. ESRD: Usual MWF schedule - refused HD 2/16, dialyzed partially 2/17 - she signed off early. She tolerated HD yesterday. On HD: she's agitated today and stated wanting to come off machine, bilateral mittens were placed for safety, will continue HD for as long as she allows [place] to.Marland Kitchen HTN/volume: BP elevated on HD. No LE edema and below prior EDW - lips are dry. UF as tolerated. Anemia of ESRD: Hgb 8.6 - ESA dose due today and will order. Secondary hyperparathyroidism: CorrCa high, no VDRA. Phos high - but coming down, continue Renvela as binder for now. Nutrition: Alb low, continue supplements. A-fib: On amiodarone + Eliquis GOC: Palliative Care had a family meeting yesterday afternoon and I was present for some of the conversation. Ms. Burling's mental status was intact yesterday afternoon and she stated how she wanted to continue living and continue with hemodialysis. She stated she wants to continue spending as much time with her children and  grand-children for as long as she can. During the family meeting, it was emphasized that in order for her to continue living, she needs to come to all required hemodialysis treatments including receiving her antibiotics. Patient is currently agitated on HD today and wanted to get off the machine. Ms. Bailin has a history of refusing hemodialysis in the past during previous hospitalizations. We will continue to do our best to keep her on dialysis for as long as she allows [place] to and monitor her mental status closely as she continues to wax and wane.  Tobie Poet, NP [facility] 01/26/2023,10:34 AM  LOS: 10 days

## 2023-01-26 NOTE — TOC Transition Note (Signed)
Transition of Care [facility]) - CM/SW Discharge Note   Patient Details  Name: Diamond [name] MRN: [id number] Date of Birth: [birth year]  Transition of Care [facility]) CM/SW Contact:  Vinie Sill, LCSW Phone Number: 01/26/2023, 12:54 PM   Clinical Narrative:     Patient will Discharge to: [facility] Discharge Date: 01/26/2023 Family Notified: family Transport By: [facility]   Per MD patient is ready for discharge. RN, patient, and facility notified of discharge. Discharge Summary sent to facility. RN given number for [phone], RM [id number]. Ambulance transport requested for patient.   Clinical Social Worker signing off.  Thurmond Butts, MSW, LCSW Clinical Social Worker     Final next level of care: Skilled Nursing Facility Barriers to Discharge: Barriers Resolved   Patient Goals and CMS Choice      Discharge Placement                Patient chooses bed at: [facility] Patient to be transferred to facility by: [facility] Name of family member notified: family Patient and family notified of of transfer: 01/26/23  Discharge Plan and Services Additional resources added to the After Visit Summary for                                       Social Determinants of Health (SDOH) Interventions SDOH Screenings   Food Insecurity: No Food Insecurity (11/30/2022)  Housing: Low Risk  (11/30/2022)  Transportation Needs: No Transportation Needs (11/30/2022)  Utilities: Not At Risk (11/30/2022)  Depression (PHQ2-9): Low Risk  (01/05/2023)  Social Connections: Moderately Integrated (07/20/2022)  Tobacco Use: Low Risk  (01/24/2023)     Readmission Risk Interventions    06/30/2022    1:12 PM 06/17/2022    1:02 PM 01/22/2022   12:19 PM  Readmission Risk Prevention Plan  Transportation Screening Complete Complete Complete  Medication Review Press photographer) Complete Complete Complete  PCP or Specialist appointment within 3-5 days of discharge  Complete Complete Complete  HRI or Home Care Consult Complete Complete Complete  SW Recovery Care/Counseling Consult Complete Complete Complete  Palliative Care Screening Not Applicable Not Applicable Not [place] Not Applicable Complete Not Applicable

## 2023-01-26 NOTE — Progress Notes (Signed)
   01/26/23 1006  During Treatment Monitoring  Intra-Hemodialysis Comments  (tx resumed)

## 2023-01-26 NOTE — Discharge Summary (Signed)
Physician Discharge Summary  Diamond [name] [id number] DOB: 1951/10/06 DOA: 01/15/2023  PCP: [facility] [place] date: 01/15/2023 Discharge date: 01/26/2023  Admitted From: Home Disposition: Skilled nursing facility  Recommendations for Outpatient Follow-up:  ID clinic as scheduled Hemodialysis as scheduled  Home Health: N/A Equipment/Devices: N/A  Discharge Condition: Fair CODE STATUS: Full code Diet recommendation: Low-salt diet, nutritional supplements  Discharge summary: 72 year old with history of ESRD on hemodialysis TTS, paroxysmal A-fib on Eliquis, essential hypertension, chronic hepatitis C, hypothyroidism, 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  MRI lumbar spine showed persistent findings concerning for osteomyelitis and discitis at L2 and 3, L4 and 5 with slight interval worsening of paravertebral inflammatory changes as compared to previous MRI.  2.3 cm heterogeneous area of involving left psoas muscle at the level of L3 likely phlegmon.  No drainable abscess.  Admitted with ID consultation. Clinically improving.  Difficulty tolerating dialysis, however family and patient desired to continue dialysis. Purpose of hospitalization is met, can transition to a SNF today.     Assessment & plan of care:   Recurrent MRSA bacteremia, 12/2021, 06/2022, 09/2022. 2/10, blood cultures with MRSA. 2/11, blood cultures negative so far. 2/12, TTE with thickened aortic valve. 1/17, 2.1 x 1.37 multilobulated mobile mass attached to the RA/IVC junction. MRI on 2/10, Persistent osteomyelitis discitis with left psoas muscle phlegmon of 2.3 cm  MRI 2/18, persistent osteomyelitis and discitis, 2.5 cm abscess on the L3 level/left psoas abscess. IR 2/20, attempted aspiration.  Only 1 mL of brown bloody purulent fluid aspirated.  Sent for culture.   Results are pending.   Repeat cultures cleared.  Remains on vancomycin with hemodialysis.  Plan to continue vancomycin likely lifelong.  Currently for 8 weeks.  ID will schedule follow-up. Patient had plans for TEE, high risk of decompensation on anesthesia.  TEE was not pursued after discussing with family as it is not going to change any management, apparently patient should be on suppressive antibiotic therapy for lifelong.   Paroxysmal atrial flutter: Now rate controlled on amiodarone 200 mg twice daily, metoprolol 50 mg twice daily.  She is therapeutic on Eliquis. Amiodarone, changed to 200 mg daily after 7 days of loading that she already finished.  Metoprolol to 50 mg twice daily.   ESRD on hemodialysis: Getting dialysis in the hospital.  Occasional difficulty with tolerating dialysis. This patient will have difficulty tolerating hemodialysis, however currently plans to continue.   Acute metabolic infective encephalopathy: As above.  Mental status improving but fluctuates.   Goal of care: Multiple difficulty to treat conditions and intolerance to dialysis. Palliative met with family 2/20.  Currently desires full code" continue to treatment with hemodialysis and rehab. This patient has difficulty tolerating hemodialysis, she gets more irritated when he started on hemodialysis. Given these circumstances, she is very high risk of readmission from dialysis unit.   Stable to transfer to a SNF as patient is stable and no more surgical plans are rescheduled.   Discharge Diagnoses:  Principal Problem:   Acute on chronic osteomyelitis ([place]) Active Problems:   Acute metabolic encephalopathy   ESRD on hemodialysis ([facility])   Paroxysmal atrial flutter ([facility])   Hypertension   Discitis of lumbar region   Metabolic acidosis    Discharge Instructions  Discharge Instructions     Diet - low sodium heart healthy   Complete by: As directed  Home infusion instructions   Complete by: As  directed    Instructions: Flushing of vascular access device: 0.9% NaCl pre/post medication administration and prn patency; Heparin 100 u/ml, 69m for implanted ports and Heparin 10u/ml, 534mfor all other central venous catheters.   Increase activity slowly   Complete by: As directed    No wound care   Complete by: As directed       Allergies as of 01/26/2023       Reactions   Shrimp (diagnostic) Other (See Comments)   Unknown reaction   Other Swelling, Rash   Anesthesia - facial swelling, rash        Medication List     STOP taking these medications    hydrALAZINE 100 MG tablet Commonly known as: APRESOLINE   metoprolol succinate 25 MG 24 hr tablet Commonly known as: Toprol XL   sevelamer carbonate 0.8 g Pack packet Commonly known as: RENVELA       TAKE these medications    acetaminophen 325 MG tablet Commonly known as: TYLENOL Take 2 tablets (650 mg total) by mouth 4 (four) times daily. What changed:  when to take this reasons to take this   albuterol 108 (90 Base) MCG/ACT inhaler Commonly known as: VENTOLIN HFA Inhale 2 puffs into the lungs every 4 (four) hours as needed for wheezing or shortness of breath.   amiodarone 200 MG tablet Commonly known as: PACERONE Take 1 tablet (200 mg total) by mouth daily. Start taking on: January 27, 2023   apixaban 2.5 MG Tabs tablet Commonly known as: ELIQUIS Take 1 tablet (2.5 mg total) by mouth 2 (two) times daily.   cloNIDine 0.1 MG tablet Commonly known as: CATAPRES Take 0.1 mg by mouth every 6 (six) hours as needed (SBP > 170).   fluticasone-salmeterol 250-50 MCG/ACT Aepb Commonly known as: Advair Diskus Inhale 1 puff into the lungs in the morning and at bedtime.   gabapentin 100 MG capsule Commonly known as: NEURONTIN Take 1 capsule (100 mg total) by mouth at bedtime. What changed:  when to take this additional instructions   HYDROcodone-acetaminophen 5-325 MG tablet Commonly known as:  NORCO/VICODIN Take 1 tablet by mouth every 6 (six) hours as needed for up to 3 days for severe pain.   metoprolol tartrate 50 MG tablet Commonly known as: LOPRESSOR Take 1 tablet (50 mg total) by mouth 2 (two) times daily.   MIRCERA IJ Mircera   pantoprazole 40 MG tablet Commonly known as: PROTONIX Take 1 tablet (40 mg total) by mouth daily. What changed: when to take this   sevelamer 800 MG tablet Commonly known as: RENAGEL Take 2 tablets by mouth 3 times a day every Tuesday Thursday and Saturday Give 2 tablets by mouth with meals every Monday Wednesday Friday and Sunday What changed:  how much to take how to take this when to take this additional instructions   vancomycin  IVPB Inject 500 mg into the vein every Monday, Wednesday, and Friday with hemodialysis. Indication:  Recurrent MRSA discitis First Dose: Yes Last Day of Therapy:  03/13/23 Labs - Sunday/Monday:  CBC/D, BMP, and vancomycin trough. Labs - Thursday:  BMP and vancomycin trough Labs - Every other week:  ESR and CRP Method of administration: Per HD protocol - to be given with hemodialysis Method of administration may be changed at the discretion of the patient and/or caregiver's ability to self-administer the medication ordered.  Home Infusion Instuctions  (From admission, onward)           Start     Ordered   01/21/23 0000  Home infusion instructions       Question:  Instructions  Answer:  Flushing of vascular access device: 0.9% NaCl pre/post medication administration and prn patency; Heparin 100 u/ml, 62m for implanted ports and Heparin 10u/ml, 566mfor all other central venous catheters.   01/21/23 1434            Contact information for follow-up providers     PeFreada BergeronMD Follow up on 02/21/2023.   Specialties: Cardiology, Radiology Why: at 10:05 AM  with her NP MiChristen Bamelease arrive 15 min early to check in. Contact information: [id number]. [phone]            Contact information for after-discharge care     Destination     [facility] Preferred SNF .   Service: Skilled Nursing Contact information: [phone]                  Allergies  Allergen Reactions   Shrimp (Diagnostic) Other (See Comments)    Unknown reaction   Other Swelling and Rash    Anesthesia - facial swelling, rash    Consultations: Nephrology Palliative care Infectious disease   Procedures/Studies: CT GUIDED NEEDLE PLACEMENT  Result Date: 01/25/2023 INDICATION: [age]ear old with discitis/osteomyelitis and concern for a left psoas abscess collection. EXAM: CT-GUIDED ASPIRATION OF LEFT PSOAS ABSCESS TECHNIQUE: Multidetector CT imaging of the abdomen and pelvis was performed following the standard protocol without IV contrast. RADIATION DOSE REDUCTION: This exam was performed according to the departmental dose-optimization program which includes automated exposure control, adjustment of the mA and/or kV according to patient size and/or use of iterative reconstruction technique. MEDICATIONS: Moderate sedation ANESTHESIA/SEDATION: Moderate (conscious) sedation was employed during this procedure. A total of Versed 1.0 mg and Fentanyl 25 mcg was administered intravenously by the radiology nurse. Total intra-service moderate Sedation Time: 10 minutes. The patient's level of consciousness and vital signs were monitored continuously by radiology nursing throughout the procedure under my direct supervision. COMPLICATIONS: None immediate. PROCEDURE: Informed consent was obtained from the patient's family due to patient confusion. A timeout was performed prior to the initiation of the procedure. Patient was placed prone. CT images of the abdomen and pelvis were obtained. Area of concern in the left psoas muscle was targeted. The left side of  the back was prepped with chlorhexidine and sterile field was created. Skin and soft tissues were anesthetized using 1% lidocaine. Small incision was made. Using CT guidance, an 18 gauge trocar needle was directed into the left psoas muscle at the area of concern. Approximately 1 mL of brown/bloody purulent fluid was aspirated. Needle was removed. Bandage placed over the puncture site. FINDINGS: Disease at the L2-L3 disc space was identified. The small fluid collections in left psoas muscle on the recent MRI were difficult to visualize with CT. Therefore, the left psoas muscle adjacent to the abnormal L2-L3 disc was targeted. Needle was directed into the central portion of the left psoas muscle and 1 mL of purulent fluid was aspirated. Fluid was sent for culture. IMPRESSION: Successful CT-guided aspiration of the left psoas abscess. 1 mL of purulent fluid was removed. Electronically Signed   By: AdMarkus Daft.D.   On: 01/25/2023 12:34  MR LUMBAR SPINE WO CONTRAST  Result Date: 01/23/2023 CLINICAL DATA:  Follow-up epidural phlegmon EXAM: MRI LUMBAR SPINE WITHOUT CONTRAST TECHNIQUE: Multiplanar, multisequence MR imaging of the lumbar spine was performed. No intravenous contrast was administered. COMPARISON:  Eight days ago FINDINGS: Segmentation: Transitional lumbosacral vertebra numbered L5 based on priors. Alignment:  Physiologic. Vertebrae: There is a chronic fracture through the L3 body, also seen on in October 2023 study with fluid-filled fracture cleft. Paravertebral edema on both sides affecting the psoas with left-sided partially septated collection measuring up to 2.5 cm. L2-3 disc hyperintensity without notable L2 marrow edema. Chronic superior endplate fracture at L2. There may be involvement of the right facet at L2-3 where there is foraminal cystic appearance posteriorly measuring 7 mm. Posteriorly there is epidural material at the level of L2-3 best seen on sagittal T2 weighted imaging, contributing  to spinal stenosis. L4-5 disc collapse and endplate irregularity with mild FLAIR hyperintensity. Compared with prior CT this is more remote discitis/osteomyelitis with possible completed healing. Conus medullaris and cauda equina: Conus extends to the T12-L1 level. Conus and cauda equina appear normal. Paraspinal and other soft tissues: Paravertebral inflammation is noted above. Disc levels: T12- L1: Unremarkable. L1-L2: Mild disc narrowing and bulging L2-L3: Disc narrowing and bulging. Right foraminal herniation or purulence suspected there may be some erosion of the right facet within anterior 7 mm cystic density. Right foraminal impingement and advanced spinal stenosis. L3-L4: Narrowing and bulging with ligamentum flavum thickening. Advanced spinal stenosis. Moderate biforaminal stenosis. L4-L5: Disc collapse and endplate destruction as noted above. There is circumferential endplate ridging with high-grade spinal stenosis and bilateral L5 impingement at the subarticular recesses. L5-S1:Incomplete segmentation IMPRESSION: 1. Transitional L5 vertebra as previously numbered. 2. L2-3 discitis/osteomyelitis with purulence in a chronic L3 body fracture. Progression of left psoas involvement at this level to an abscess appearance measuring up to 2.5 cm. There may be facet arthritis on the right at this level. Posterior epidural phlegmon/collection combines with degeneration to cause high-grade thecal sac narrowing at this level. 3. L4-5 collapse and endplate destruction from more chronic discitis/osteomyelitis. 4. L3-4 and L4-5 advanced degenerative spinal stenosis. Electronically Signed   By: Jorje Guild M.D.   On: 01/23/2023 09:16   ECHOCARDIOGRAM COMPLETE  Result Date: 01/17/2023    ECHOCARDIOGRAM REPORT   Patient Name:   Diamond [name] Date of Exam: 01/17/2023 Medical Rec #:  [id number]      Height:       66.0 in Accession #:    [id number]     Weight:       116.4 lb Date of Birth:  Sep 24, 1951     BSA:           1.589 m Patient Age:    72 years       BP:           181/97 mmHg Patient Gender: F              HR:           80 bpm. Exam Location:  Inpatient Procedure: 2D Echo Indications:    bacteremia  History:        Patient has prior history of Echocardiogram examinations, most                 recent 09/22/2022.  Sonographer:    Harvie Junior Referring Phys: [place]  1. Left ventricular ejection fraction, by estimation, is 60 to 65%. The left ventricle has normal  function. The left ventricle has no regional wall motion abnormalities. Left ventricular diastolic parameters are consistent with Grade II diastolic dysfunction (pseudonormalization).  2. Right ventricular systolic function is normal. The right ventricular size is normal. There is mildly elevated pulmonary artery systolic pressure.  3. The mitral valve is abnormal. Trivial mitral valve regurgitation. No evidence of mitral stenosis. Moderate mitral annular calcification.  4. The aortic valve is tricuspid. There is moderate calcification of the aortic valve. There is mild thickening of the aortic valve. Aortic valve regurgitation is trivial. Aortic valve sclerosis/calcification is present, without any evidence of aortic stenosis.  5. The inferior vena cava is normal in size with greater than 50% respiratory variability, suggesting right atrial pressure of 3 mmHg. Comparison(s): No significant change from prior study. Conclusion(s)/Recommendation(s): No evidence of valvular vegetations on this transthoracic echocardiogram. Consider a transesophageal echocardiogram to exclude infective endocarditis if clinically indicated. FINDINGS  Left Ventricle: Left ventricular ejection fraction, by estimation, is 60 to 65%. The left ventricle has normal function. The left ventricle has no regional wall motion abnormalities. The left ventricular internal cavity size was normal in size. There is  borderline left ventricular hypertrophy. Left ventricular  diastolic parameters are consistent with Grade II diastolic dysfunction (pseudonormalization). Right Ventricle: The right ventricular size is normal. No increase in right ventricular wall thickness. Right ventricular systolic function is normal. There is mildly elevated pulmonary artery systolic pressure. The tricuspid regurgitant velocity is 3.09  m/s, and with an assumed right atrial pressure of 3 mmHg, the estimated right ventricular systolic pressure is [id number] mmHg. Left Atrium: Left atrial size was normal in size. Right Atrium: Right atrial size was normal in size. Pericardium: There is no evidence of pericardial effusion. Mitral Valve: The mitral valve is abnormal. There is mild thickening of the mitral valve leaflet(s). There is mild calcification of the mitral valve leaflet(s). Moderate mitral annular calcification. Trivial mitral valve regurgitation. No evidence of mitral valve stenosis. Tricuspid Valve: The tricuspid valve is normal in structure. Tricuspid valve regurgitation is mild . No evidence of tricuspid stenosis. Aortic Valve: The aortic valve is tricuspid. There is moderate calcification of the aortic valve. There is mild thickening of the aortic valve. Aortic valve regurgitation is trivial. Aortic valve sclerosis/calcification is present, without any evidence of aortic stenosis. Aortic valve mean gradient measures 5.0 mmHg. Aortic valve peak gradient measures 8.9 mmHg. Aortic valve area, by VTI measures 2.10 cm. Pulmonic Valve: The pulmonic valve was grossly normal. Pulmonic valve regurgitation is not visualized. No evidence of pulmonic stenosis. Aorta: The aortic root, ascending aorta, aortic arch and descending aorta are all structurally normal, with no evidence of dilitation or obstruction. Venous: The inferior vena cava is normal in size with greater than 50% respiratory variability, suggesting right atrial pressure of 3 mmHg. IAS/Shunts: The atrial septum is grossly normal.  LEFT VENTRICLE  PLAX 2D LVIDd:         5.05 cm      Diastology LVIDs:         3.95 cm      LV e' medial:    7.15 cm/s LV PW:         1.00 cm      LV E/e' medial:  16.8 LV IVS:        1.05 cm      LV e' lateral:   8.70 cm/s LVOT diam:     1.90 cm      LV E/e' lateral: 13.8 LV SV:  62 LV SV Index:   39 LVOT Area:     2.84 cm                              3D Volume EF: LV Volumes (MOD)            3D EF:        59 % LV vol d, MOD A2C: 141.0 ml LV EDV:       114 ml LV vol d, MOD A4C: 134.0 ml LV ESV:       47 ml LV vol s, MOD A2C: 52.2 ml  LV SV:        67 ml LV vol s, MOD A4C: 48.5 ml LV SV MOD A2C:     88.8 ml LV SV MOD A4C:     134.0 ml LV SV MOD BP:      93.5 ml RIGHT VENTRICLE RV Basal diam:  3.50 cm RV Mid diam:    2.40 cm RV S prime:     18.80 cm/s TAPSE (M-mode): 3.0 cm LEFT ATRIUM           Index        RIGHT ATRIUM           Index LA diam:      3.50 cm 2.20 cm/m   RA Area:     12.90 cm LA Vol (A4C): 47.7 ml 30.02 ml/m  RA Volume:   29.60 ml  18.63 ml/m  AORTIC VALVE                    PULMONIC VALVE AV Area (Vmax):    2.25 cm     PV Vmax:       1.28 m/s AV Area (Vmean):   2.15 cm     PV Peak grad:  6.6 mmHg AV Area (VTI):     2.10 cm AV Vmax:           149.00 cm/s AV Vmean:          99.500 cm/s AV VTI:            0.297 m AV Peak Grad:      8.9 mmHg AV Mean Grad:      5.0 mmHg LVOT Vmax:         118.00 cm/s LVOT Vmean:        75.600 cm/s LVOT VTI:          0.220 m LVOT/AV VTI ratio: 0.74  AORTA Ao Root diam: 3.10 cm Ao Asc diam:  3.50 cm MITRAL VALVE                TRICUSPID VALVE MV Area (PHT): 4.31 cm     TR Peak grad:   38.2 mmHg MV Decel Time: 176 msec     TR Vmax:        309.00 cm/s MR Peak grad: 26.8 mmHg MR Vmax:      259.00 cm/s   SHUNTS MV E velocity: 120.00 cm/s  Systemic VTI:  0.22 m MV A velocity: 113.00 cm/s  Systemic Diam: 1.90 cm MV E/A ratio:  1.06 Buford Dresser MD Electronically signed by Buford Dresser MD Signature Date/Time: 01/17/2023/2:55:12 PM    Final    MR LUMBAR SPINE WO  CONTRAST  Result Date: 01/15/2023 CLINICAL DATA:  Initial evaluation for worsened lower back pain. EXAM: MRI LUMBAR SPINE WITHOUT CONTRAST TECHNIQUE: Multiplanar, multisequence MR imaging of  the lumbar spine was performed. No intravenous contrast was administered. COMPARISON:  Previous MRI from 11/30/2022. FINDINGS: Segmentation: Transitional lumbosacral anatomy with sacralization of L5. Same numbering system employed as on previous exam. Alignment: Stable trace anterolisthesis of L1 on L2 and L3 on L4. Underlying mild scoliosis. Vertebrae: Persistent endplate irregularity with decreased T1 signal intensity, and marrow edema about the L2-3 and L4-5 interspaces. Similar intra discal fluid signal intensity at L2-3 and L3-4. Underlying chronic L2 and L3 compression deformities, stable. No acute or interval fracture. Bone marrow signal intensity heterogeneous without worrisome osseous lesion. Conus medullaris and cauda equina: Conus extends to the L1 level. Conus and cauda equina appear normal. Soft tissue material within the ventral epidural space at L3 suspicious for phlegmon, similar to prior. Paraspinal and other soft tissues: Paravertebral edema and inflammatory changes seen involving the left greater than right psoas musculature. Overall changes are slightly worsened from prior. Superimposed heterogeneous T2 hypointense area measuring 2.3 cm within the left psoas muscle is increased from prior, likely phlegmon. No drainable soft tissue fluid collections. Renal atrophy with multiple bilateral simple cysts, consistent with history of end-stage renal disease. No follow-up imaging recommended. Atherosclerotic change noted within the aorta. Disc levels: L1-2: Diffuse disc bulge with endplate spurring. Mild facet hypertrophy. Unchanged mild narrowing of the lateral recesses. Central canal remains patent. Mild left L1 foraminal narrowing. Right neural foramina remains patent. L2-3: Mild disc bulge. Mild to moderate  facet hypertrophy with ligament flavum thickening. Resultant mild-to-moderate canal with bilateral subarticular stenosis. Mild bilateral L2 foraminal narrowing. Appearance is relatively stable. L3-4: Diffuse disc bulge. Moderate facet and ligament flavum hypertrophy. Superimposed epidural phlegmon within the ventral epidural space at L3. Resultant severe spinal stenosis. Mild to moderate left with mild right L3 foraminal narrowing. Appearance is stable. L4-5: Severe intervertebral disc space narrowing with diffuse disc osteophyte complex. Superimposed small central disc protrusion with caudad angulation. Right worse than left facet hypertrophy. Moderate spinal stenosis with moderate to severe bilateral lateral recess narrowing, stable. Mild-to-moderate left L4 foraminal narrowing. Right neural foramen remains patent. L5-S1: Transitional segment. No disc bulge or focal disc herniation. No stenosis. IMPRESSION: 1. Persistent findings concerning for osteomyelitis discitis at L2-3 and L4-5 with slight interval worsening in paravertebral inflammatory changes as compared to previous MRI from 11/30/2022. Superimposed 2.3 cm heterogeneous area of involving the left psoas muscle at the level of L3 likely reflects phlegmon. No drainable fluid collections identified. 2. Underlying moderate multilevel spondylosis and facet arthrosis with moderate to severe canal and lateral recess narrowing at L2-3 through L5-S1 as above. Appearance is not significantly changed as compared to prior. Electronically Signed   By: Jeannine Boga M.D.   On: 01/15/2023 21:59   DG Chest Port 1 View  Result Date: 01/15/2023 CLINICAL DATA:  Weakness and pain. EXAM: PORTABLE CHEST 1 VIEW COMPARISON:  11/30/2022 FINDINGS: Telemetry leads overlie the chest. UPPER limits normal heart size again noted. The mediastinal silhouette is unchanged. There is no evidence of focal airspace disease, pulmonary edema, suspicious pulmonary nodule/mass, pleural  effusion, or pneumothorax. No acute bony abnormalities are identified. IMPRESSION: No active disease. Electronically Signed   By: Margarette Canada M.D.   On: 01/15/2023 13:17   (Echo, Carotid, EGD, Colonoscopy, ERCP)    Subjective: Patient seen in the dialysis unit.  Patient tells me she has some back pain as they did some surgery yesterday.  Patient was pulling on her IV lines and dialysis catheter, she was given admittance and she was very unhappy.  "  You treat me like a child, I do not want all this".   Discharge Exam: Vitals:   01/26/23 0900 01/26/23 0930  BP: (!) 161/93 (!) 178/99  Pulse:  60  Resp:    Temp:    SpO2:     Vitals:   01/26/23 0755 01/26/23 0829 01/26/23 0900 01/26/23 0930  BP: (!) 168/93 (!) 172/98 (!) 161/93 (!) 178/99  Pulse: 63 60  60  Resp: 17 16    Temp: 98.3 F (36.8 C)     TempSrc: Oral     SpO2: 100% 100%    Weight: 53 kg     Height:        General: Pt is alert, awake, unhappy.  Flat affect.  Anxious.  Irritated. Currently receiving hemodialysis.   The results of significant diagnostics from this hospitalization (including imaging, microbiology, ancillary and laboratory) are listed below for reference.     Microbiology: Recent Results (from the past 240 hour(s))  Culture, blood (Routine X 2) w Reflex to ID Panel     Status: None   Collection Time: 01/16/23  5:59 PM   Specimen: BLOOD RIGHT ARM  Result Value Ref Range Status   Specimen Description BLOOD RIGHT ARM  Final   Special Requests   Final    BOTTLES DRAWN AEROBIC AND ANAEROBIC Blood Culture results may not be optimal due to an inadequate volume of blood received in culture bottles   Culture   Final    NO GROWTH 5 DAYS Performed at [facility], [place] [address], [place], [place] [address]    Report Status 01/21/2023 FINAL  Final  Culture, blood (Routine X 2) w Reflex to ID Panel     Status: None   Collection Time: 01/16/23  5:59 PM   Specimen: BLOOD RIGHT ARM  Result Value Ref Range  Status   Specimen Description BLOOD RIGHT ARM  Final   Special Requests   Final    BOTTLES DRAWN AEROBIC AND ANAEROBIC Blood Culture results may not be optimal due to an inadequate volume of blood received in culture bottles   Culture   Final    NO GROWTH 5 DAYS Performed at [facility], [place] [address], [place], [place] [address]    Report Status 01/21/2023 FINAL  Final     Labs: BNP (last 3 results) Recent Labs    06/12/22 1545  BNP [id number]*   Basic Metabolic Panel: Recent Labs  Lab 01/19/23 0949 01/21/23 0238 01/22/23 1011 01/24/23 1452 01/25/23 1726 01/26/23 0826  NA 131* 134* 134* 136 137 136  K 4.2 4.3 4.7 4.6 3.6 3.7  CL 88* 94* 95* 96* 100 99  CO2 24 24 24 25 25 25  $ GLUCOSE 83 107* 91 88 118* 98  BUN 54* 37* 53* 46* 26* 32*  CREATININE 7.49* 5.73* 7.52* 7.08* 4.40* 5.41*  CALCIUM 9.8 9.6 9.2 9.4 9.3 9.5  PHOS 7.0*  --  6.6* 6.1* 4.4 4.9*   Liver Function Tests: Recent Labs  Lab 01/19/23 0949 01/22/23 1011 01/24/23 1452 01/25/23 1726 01/26/23 0826  ALBUMIN 2.2* 2.2* 2.1* 2.3* 2.3*   No results for input(s): "LIPASE", "AMYLASE" in the last 168 hours. No results for input(s): "AMMONIA" in the last 168 hours. CBC: Recent Labs  Lab 01/21/23 0238 01/22/23 1011 01/24/23 1452 01/25/23 1448 01/26/23 0826  WBC 7.1 7.3 4.9 5.6 5.1  NEUTROABS 5.8  --   --   --   --   HGB 9.6* 9.3* 8.6*  10.3* 8.6*  HCT 28.5* 26.0* 26.4* 32.1* 25.4*  MCV 96.6 96.7 100.0 100.9* 97.7  PLT 414* 336 384 335 353   Cardiac Enzymes: No results for input(s): "CKTOTAL", "CKMB", "CKMBINDEX", "TROPONINI" in the last 168 hours. BNP: Invalid input(s): "POCBNP" CBG: No results for input(s): "GLUCAP" in the last 168 hours. D-Dimer No results for input(s): "DDIMER" in the last 72 hours. Hgb A1c No results for input(s): "HGBA1C" in the last 72 hours. Lipid Profile No results for input(s): "CHOL", "HDL", "LDLCALC", "TRIG", "CHOLHDL", "LDLDIRECT" in the last 72 hours. Thyroid  function studies No results for input(s): "TSH", "T4TOTAL", "T3FREE", "THYROIDAB" in the last 72 hours.  Invalid input(s): "FREET3" Anemia work up No results for input(s): "VITAMINB12", "FOLATE", "FERRITIN", "TIBC", "IRON", "RETICCTPCT" in the last 72 hours. Urinalysis    Component Value Date/Time   COLORURINE YELLOW 12/01/2022 1037   APPEARANCEUR CLEAR 12/01/2022 1037   LABSPEC 1.008 12/01/2022 1037   PHURINE 8.0 12/01/2022 1037   GLUCOSEU NEGATIVE 12/01/2022 1037   HGBUR NEGATIVE 12/01/2022 1037   BILIRUBINUR NEGATIVE 12/01/2022 1037   KETONESUR NEGATIVE 12/01/2022 1037   PROTEINUR 100 (A) 12/01/2022 1037   UROBILINOGEN 0.2 09/27/2021 1329   NITRITE NEGATIVE 12/01/2022 1037   LEUKOCYTESUR NEGATIVE 12/01/2022 1037   Sepsis Labs Recent Labs  Lab 01/22/23 1011 01/24/23 1452 01/25/23 1448 01/26/23 0826  WBC 7.3 4.9 5.6 5.1   Microbiology Recent Results (from the past 240 hour(s))  Culture, blood (Routine X 2) w Reflex to ID Panel     Status: None   Collection Time: 01/16/23  5:59 PM   Specimen: BLOOD RIGHT ARM  Result Value Ref Range Status   Specimen Description BLOOD RIGHT ARM  Final   Special Requests   Final    BOTTLES DRAWN AEROBIC AND ANAEROBIC Blood Culture results may not be optimal due to an inadequate volume of blood received in culture bottles   Culture   Final    NO GROWTH 5 DAYS Performed at [facility], [place] [address] [place], [place] [address]    Report Status 01/21/2023 FINAL  Final  Culture, blood (Routine X 2) w Reflex to ID Panel     Status: None   Collection Time: 01/16/23  5:59 PM   Specimen: BLOOD RIGHT ARM  Result Value Ref Range Status   Specimen Description BLOOD RIGHT ARM  Final   Special Requests   Final    BOTTLES DRAWN AEROBIC AND ANAEROBIC Blood Culture results may not be optimal due to an inadequate volume of blood received in culture bottles   Culture   Final    NO GROWTH 5 DAYS Performed at [facility], [place]  [address] [place], [place] [address]    Report Status 01/21/2023 FINAL  Final     Time coordinating discharge:  35 minutes  SIGNED:   Barb Merino, MD  [facility] 01/26/2023, 9:47 AM

## 2023-01-26 NOTE — Progress Notes (Signed)
Received patient in bed to unit.  Alert and oriented.  Informed consent signed and in chart.   TX duration: 2:44  Patient during tx was pulling at needles and yelling. Machine clotted due to pt not keeping her arm still.  Transported back to the room  Alert, without acute distress.  Hand-off given to patient's nurse.   Access used: left AVF Access issues: none  Total UF removed: 571m Medication(s) given: none    01/26/23 1221  Vitals  Temp 98.4 F (36.9 C)  Temp Source Oral  BP (!) 143/82  MAP (mmHg) 108  BP Location Right Arm  BP Method Automatic  Patient Position (if appropriate) Lying  Pulse Rate 61  Pulse Rate Source Monitor  Resp 16  Oxygen Therapy  SpO2 100 %  O2 Device Room Air  During Treatment Monitoring  Blood Flow Rate (mL/min) 270 mL/min  Arterial Pressure (mmHg) -160 mmHg  Venous Pressure (mmHg) 270 mmHg  TMP (mmHg) 32 mmHg  Ultrafiltration Rate (mL/min) 593 mL/min  Dialysate Flow Rate (mL/min) 300 ml/min  HD Safety Checks Performed Yes  Intra-Hemodialysis Comments Tx completed;See progress note (txt ended early due to clotting and patient not keeping arm still)  Dialysis Fluid Bolus Normal Saline  Bolus Amount (mL) 300 mL  Post Treatment  Dialyzer Clearance Heavily streaked  Duration of HD Treatment -hour(s) 2.44 hour(s)  Liters Processed 49.2  Fluid Removed (mL) 500 mL  AVG/AVF Arterial Site Held (minutes) 7 minutes  AVG/AVF Venous Site Held (minutes) 7 minutes  Fistula / Graft Left Forearm Arteriovenous fistula  Placement Date/Time: (c) 12/23/21 1646   Placed prior to admission: No  Orientation: Left  Access Location: Forearm  Access Type: Arteriovenous fistula  Status Flushed;Deaccessed      Sarahlynn Cisnero [facility]

## 2023-01-26 NOTE — Progress Notes (Signed)
OT Cancellation Note  Patient Details Name: Diamond [name] MRN: [id number] DOB: [birth year]   Cancelled Treatment:    Reason Eval/Treat Not Completed: Other (comment). Pt discharging to [facility] this afternoon, transport has been called  Britt Bottom 01/26/2023, 1:49 PM

## 2023-01-26 NOTE — Progress Notes (Signed)
   01/26/23 0955  During Treatment Monitoring  Intra-Hemodialysis Comments  (tx paused due to alarm saying  check cartridge and disgard blood, cartidge changed)

## 2023-01-26 NOTE — Progress Notes (Signed)
[place] for Infectious Disease  Date of Admission:  01/15/2023           Reason for visit: Follow up on MRSA infection  Current antibiotics: Vancomycin  ASSESSMENT:    73 y.o. female admitted with:  Recurrent MRSA bacteremia: Patient has had episodes of bacteremia in January 2023, July 2023, October 2023 and is again admitted with recurrent bacteremia with positive blood cultures from 01/15/2023.  Repeat cultures 01/16/2023 finalized as no growth.  TTE showed mild thickening of the aortic valve.  TEE not pursued as unlikely to change any management. Persistent discitis/osteomyelitis: At L2-3 and L4-5 with paravertebral inflammatory changes and superimposed phlegmon. Left psoas abscess: Noted to have progression on repeat MRI 01/23/2023.  IR attempted to place a drain 01/25/23 but collection was barely visible.  This was aspirated, but no drain placed. End-stage renal disease: On hemodialysis via left aVF.  No signs of infection at this site and has been evaluated by vascular surgery with no concerns. Chronic hepatitis C: RNA detectable in August 2023 and awaiting treatment as an outpatient.  RECOMMENDATIONS:    Continue Vancomycin with HD Plan for 8 weeks from negative blood cultures followed by indefinite oral suppression if this continues to align with [place] End date of IV vancomycin = 03/13/23 See pharmacy note from 01/21/23 for vancomycin dosing regimen Will sign off, please call as needed Patient already has a follow up appt with Dr Gale Journey at [facility] on 02/17/23 at 3:45pm   Principal Problem:   Acute on chronic osteomyelitis ([place]) Active Problems:   Hypertension   ESRD on hemodialysis ([place])   Discitis of lumbar region   Paroxysmal atrial flutter (H[facility])   Acute metabolic encephalopathy   Metabolic acidosis    MEDICATIONS:    Scheduled Meds:  (feeding supplement) PROSource Plus  30 mL Oral BID BM   amiodarone  200 mg Oral BID   Followed by   Derrill Memo ON 01/27/2023]  amiodarone  200 mg Oral Daily   apixaban  2.5 mg Oral Q12H   Chlorhexidine Gluconate Cloth  6 each Topical Q0600   lidocaine  10 mL Intradermal Once   metoprolol tartrate  50 mg Oral BID   pantoprazole  40 mg Oral Daily   sevelamer carbonate  1,600 mg Oral TID WC   Continuous Infusions:  vancomycin 500 mg (01/25/23 0037)   PRN Meds:.alteplase, cloNIDine, heparin, HYDROcodone-acetaminophen, lidocaine (PF), lidocaine-prilocaine, morphine injection, nitroGLYCERIN, pentafluoroprop-tetrafluoroeth  SUBJECTIVE:   24 hour events:  Patient seen on HD today She is asking for mittens to be removed.  Per Nephrology, her mental status is waxing and waning She reports feeling short of breath and asking for her inhaler She reports hip and back are not bothering her.   Review of Systems  All other systems reviewed and are negative.     OBJECTIVE:   Blood pressure (!) 178/99, pulse 60, temperature 98.3 F (36.8 C), temperature source Oral, resp. rate 16, height 5' 6"$  (1.676 m), weight 53 kg, SpO2 100 %. Body mass index is 18.86 kg/m.  Physical Exam Constitutional:      General: She is not in acute distress.    Comments: Elderly appearing woman, lying in HD, asking for mittens to be removed.  NAD.   HENT:     Head: Normocephalic and atraumatic.     Mouth/Throat:     Mouth: Mucous membranes are dry.  Eyes:     Extraocular Movements: Extraocular movements intact.  Conjunctiva/sclera: Conjunctivae normal.  Pulmonary:     Effort: Pulmonary effort is normal. No respiratory distress.  Abdominal:     General: There is no distension.     Palpations: Abdomen is soft.  Musculoskeletal:        General: Normal range of motion.     Cervical back: Normal range of motion and neck supple.  Skin:    General: Skin is warm and dry.     Findings: No rash.  Neurological:     General: No focal deficit present.     Mental Status: She is alert.  Psychiatric:        Mood and Affect: Mood normal.         Behavior: Behavior normal.      Lab Results: Lab Results  Component Value Date   WBC 5.1 01/26/2023   HGB 8.6 (L) 01/26/2023   HCT 25.4 (L) 01/26/2023   MCV 97.7 01/26/2023   PLT 353 01/26/2023    Lab Results  Component Value Date   NA 136 01/26/2023   K 3.7 01/26/2023   CO2 25 01/26/2023   GLUCOSE 98 01/26/2023   BUN 32 (H) 01/26/2023   CREATININE 5.41 (H) 01/26/2023   CALCIUM 9.5 01/26/2023   GFRNONAA 8 (L) 01/26/2023   GFRAA 14 (L) 11/12/2019    Lab Results  Component Value Date   ALT 6 01/15/2023   AST 31 01/15/2023   ALKPHOS 52 01/15/2023   BILITOT 1.0 01/15/2023       Component Value Date/Time   CRP 33.8 (H) 01/15/2023 1218       Component Value Date/Time   ESRSEDRATE 133 (H) 01/15/2023 1218     I have reviewed the micro and lab results in Epic.  Imaging: CT GUIDED NEEDLE PLACEMENT  Result Date: 01/25/2023 INDICATION: 72 year old with discitis/osteomyelitis and concern for a left psoas abscess collection. EXAM: CT-GUIDED ASPIRATION OF LEFT PSOAS ABSCESS TECHNIQUE: Multidetector CT imaging of the abdomen and pelvis was performed following the standard protocol without IV contrast. RADIATION DOSE REDUCTION: This exam was performed according to the departmental dose-optimization program which includes automated exposure control, adjustment of the mA and/or kV according to patient size and/or use of iterative reconstruction technique. MEDICATIONS: Moderate sedation ANESTHESIA/SEDATION: Moderate (conscious) sedation was employed during this procedure. A total of Versed 1.0 mg and Fentanyl 25 mcg was administered intravenously by the radiology nurse. Total intra-service moderate Sedation Time: 10 minutes. The patient's level of consciousness and vital signs were monitored continuously by radiology nursing throughout the procedure under my direct supervision. COMPLICATIONS: None immediate. PROCEDURE: Informed consent was obtained from the patient's family due to  patient confusion. A timeout was performed prior to the initiation of the procedure. Patient was placed prone. CT images of the abdomen and pelvis were obtained. Area of concern in the left psoas muscle was targeted. The left side of the back was prepped with chlorhexidine and sterile field was created. Skin and soft tissues were anesthetized using 1% lidocaine. Small incision was made. Using CT guidance, an 18 gauge trocar needle was directed into the left psoas muscle at the area of concern. Approximately 1 mL of brown/bloody purulent fluid was aspirated. Needle was removed. Bandage placed over the puncture site. FINDINGS: Disease at the L2-L3 disc space was identified. The small fluid collections in left psoas muscle on the recent MRI were difficult to visualize with CT. Therefore, the left psoas muscle adjacent to the abnormal L2-L3 disc was targeted. Needle was directed into the central  portion of the left psoas muscle and 1 mL of purulent fluid was aspirated. Fluid was sent for culture. IMPRESSION: Successful CT-guided aspiration of the left psoas abscess. 1 mL of purulent fluid was removed. Electronically Signed   By: Markus Daft M.D.   On: 01/25/2023 12:34     Imaging independently reviewed in Epic.    Raynelle [facility] for [facility]ious [facility] [phone] pager 01/26/2023, 9:35 AM

## 2023-01-26 NOTE — Progress Notes (Signed)
OT Cancellation Note  Patient Details Name: Diamond [name] MRN: [id number] DOB: Jan 08, 1951   Cancelled Treatment:    Reason Eval/Treat Not Completed: Patient at procedure or test/ unavailable. Pt at HD this a.m. OT will check back later as time allows and as appropriate  Britt Bottom 01/26/2023, 9:10 AM

## 2023-01-26 NOTE — Progress Notes (Signed)
D/C order noted. Contacted [facility] to advise clinic of pt's d/c to snf today and that pt should resume care on Friday. Clinic provided snf name and advised pt will need iv vanc with HD and renal NP to send orders at d/c.   Melven Sartorius Renal Navigator [phone]

## 2023-02-04 ENCOUNTER — Emergency Department (HOSPITAL_COMMUNITY)
Admission: EM | Admit: 2023-02-04 | Discharge: 2023-02-04 | Disposition: A | Discharge status: Home / Self Care | Payer: Medicare (Managed Care) | Attending: Student | Admitting: Student

## 2023-02-04 ENCOUNTER — Encounter (HOSPITAL_COMMUNITY): Payer: Self-pay

## 2023-02-04 ENCOUNTER — Other Ambulatory Visit: Payer: Self-pay

## 2023-02-04 DIAGNOSIS — Z7901 Long term (current) use of anticoagulants: Secondary | ICD-10-CM | POA: Insufficient documentation

## 2023-02-04 DIAGNOSIS — N186 End stage renal disease: Secondary | ICD-10-CM | POA: Insufficient documentation

## 2023-02-04 DIAGNOSIS — Z992 Dependence on renal dialysis: Secondary | ICD-10-CM | POA: Insufficient documentation

## 2023-02-04 DIAGNOSIS — I4891 Unspecified atrial fibrillation: Secondary | ICD-10-CM | POA: Insufficient documentation

## 2023-02-04 DIAGNOSIS — M79604 Pain in right leg: Secondary | ICD-10-CM | POA: Insufficient documentation

## 2023-02-04 DIAGNOSIS — M79605 Pain in left leg: Secondary | ICD-10-CM | POA: Insufficient documentation

## 2023-02-04 LAB — CBC WITH DIFFERENTIAL/PLATELET
Abs Immature Granulocytes: 0.02 10*3/uL (ref 0.00–0.07)
Basophils Absolute: 0 10*3/uL (ref 0.0–0.1)
Basophils Relative: 1 %
Eosinophils Absolute: 0.2 10*3/uL (ref 0.0–0.5)
Eosinophils Relative: 3 %
HCT: 28.9 % — ABNORMAL LOW (ref 36.0–46.0)
Hemoglobin: 9.6 g/dL — ABNORMAL LOW (ref 12.0–15.0)
Immature Granulocytes: 0 %
Lymphocytes Relative: 15 %
Lymphs Abs: 0.9 10*3/uL (ref 0.7–4.0)
MCH: 32.3 pg (ref 26.0–34.0)
MCHC: 33.2 g/dL (ref 30.0–36.0)
MCV: 97.3 fL (ref 80.0–100.0)
Monocytes Absolute: 0.4 10*3/uL (ref 0.1–1.0)
Monocytes Relative: 6 %
Neutro Abs: 4.4 10*3/uL (ref 1.7–7.7)
Neutrophils Relative %: 75 %
Platelets: 230 10*3/uL (ref 150–400)
RBC: 2.97 MIL/uL — ABNORMAL LOW (ref 3.87–5.11)
RDW: 20.4 % — ABNORMAL HIGH (ref 11.5–15.5)
WBC: 6 10*3/uL (ref 4.0–10.5)
nRBC: 0 % (ref 0.0–0.2)

## 2023-02-04 LAB — BASIC METABOLIC PANEL
Anion gap: 15 (ref 5–15)
BUN: 43 mg/dL — ABNORMAL HIGH (ref 8–23)
CO2: 27 mmol/L (ref 22–32)
Calcium: 9.5 mg/dL (ref 8.9–10.3)
Chloride: 94 mmol/L — ABNORMAL LOW (ref 98–111)
Creatinine, Ser: 7.16 mg/dL — ABNORMAL HIGH (ref 0.44–1.00)
GFR, Estimated: 6 mL/min — ABNORMAL LOW (ref >60)
Glucose, Bld: 100 mg/dL — ABNORMAL HIGH (ref 70–99)
Potassium: 5 mmol/L (ref 3.5–5.1)
Sodium: 136 mmol/L (ref 135–145)

## 2023-02-04 LAB — PHOSPHORUS: Phosphorus: 5.2 mg/dL — ABNORMAL HIGH (ref 2.5–4.6)

## 2023-02-04 LAB — MAGNESIUM: Magnesium: 2.2 mg/dL (ref 1.7–2.4)

## 2023-02-04 MED ORDER — CLONIDINE HCL 0.1 MG PO TABS
0.1000 mg | ORAL_TABLET | Freq: Four times a day (QID) | ORAL | Status: DC | PRN
  Administered 2023-02-04: 0.1 mg via ORAL
  Filled 2023-02-04: qty 1

## 2023-02-04 MED ORDER — METOPROLOL TARTRATE 25 MG PO TABS
50.0000 mg | ORAL_TABLET | Freq: Two times a day (BID) | ORAL | Status: DC
  Administered 2023-02-04: 50 mg via ORAL
  Filled 2023-02-04: qty 2

## 2023-02-04 MED ORDER — OXYCODONE-ACETAMINOPHEN 5-325 MG PO TABS: 1.0000 | ORAL_TABLET | Freq: Four times a day (QID) | ORAL | 0 refills | Status: DC | PRN

## 2023-02-04 MED ORDER — OXYCODONE-ACETAMINOPHEN 5-325 MG PO TABS
1.0000 | ORAL_TABLET | Freq: Once | ORAL | Status: AC
  Administered 2023-02-04: 1 via ORAL
  Filled 2023-02-04: qty 1

## 2023-02-04 NOTE — ED Provider Notes (Signed)
[place] EMERGENCY DEPARTMENT AT [facility] Provider Note   CSN: [id number] Arrival date & time: 02/04/23  1831     History  Chief Complaint  Patient presents with   Leg Pain    Diamond [name] is a 72 y.o. female.  This is a 72 yo female with history of ESRD on dialysis Monday/Wednesday/Friday, hypertension, A-fib on Eliquis presenting to the emergency department for bilateral leg pain.  Patient has had cramping in her legs for the last 4 days.  It is intermittent, does not radiate, and she denies any other symptoms.  She denies any fevers, back pain, trauma.  No new medications, she did do a partial session of dialysis earlier today.  She states she takes Norco at home which minimally helps her pain.  She is also on gabapentin which she does not feel helps.        Home Medications Prior to Admission medications   Medication Sig Start Date End Date Taking? Authorizing Provider  acetaminophen (TYLENOL) 325 MG tablet Take 2 tablets (650 mg total) by mouth 4 (four) times daily. Patient taking differently: Take 650 mg by mouth every 12 (twelve) hours as needed for moderate pain. 06/30/22   Elgergawy, Leana Roe, MD  albuterol (VENTOLIN HFA) 108 (90 Base) MCG/ACT inhaler Inhale 2 puffs into the lungs every 4 (four) hours as needed for wheezing or shortness of breath. 10/06/22   Zadie Rhine, MD  amiodarone (PACERONE) 200 MG tablet Take 1 tablet (200 mg total) by mouth daily. 01/27/23   Dorcas Carrow, MD  apixaban (ELIQUIS) 2.5 MG TABS tablet Take 1 tablet (2.5 mg total) by mouth 2 (two) times daily. 10/06/22 12/01/23  Zadie Rhine, MD  cloNIDine (CATAPRES) 0.1 MG tablet Take 0.1 mg by mouth every 6 (six) hours as needed (SBP > 170). Patient not taking: Reported on 01/16/2023    [provider]  fluticasone-salmeterol (ADVAIR DISKUS) 250-50 MCG/ACT AEPB Inhale 1 puff into the lungs in the morning and at bedtime. 10/06/22   Zadie Rhine, MD  gabapentin  (NEURONTIN) 100 MG capsule Take 1 capsule (100 mg total) by mouth at bedtime. Patient taking differently: Take 100 mg by mouth See admin instructions. Twice a day on regular days, Tuesday, Thursday, Saturday and Sunday. Three times a day on dialysis days, Monday, Wednesday, and Friday 10/15/22   Vu, Tonita Phoenix, MD  Methoxy PEG-Epoetin Beta (MIRCERA IJ) Mircera 10/09/22 10/08/23  [provider]  metoprolol tartrate (LOPRESSOR) 50 MG tablet Take 1 tablet (50 mg total) by mouth 2 (two) times daily. 01/26/23   Dorcas Carrow, MD  pantoprazole (PROTONIX) 40 MG tablet Take 1 tablet (40 mg total) by mouth daily. Patient taking differently: Take 40 mg by mouth 2 (two) times daily. 10/06/22   Zadie Rhine, MD  sevelamer (RENAGEL) 800 MG tablet Take 2 tablets by mouth 3 times a day every Tuesday Thursday and Saturday Give 2 tablets by mouth with meals every Monday Wednesday Friday and Sunday Patient taking differently: Take 800 mg by mouth 3 (three) times daily with meals. 10/06/22   Zadie Rhine, MD  vancomycin IVPB Inject 500 mg into the vein every Monday, Wednesday, and Friday with hemodialysis. Indication:  Recurrent MRSA discitis First Dose: Yes Last Day of Therapy:  03/13/23 Labs - Sunday/Monday:  CBC/D, BMP, and vancomycin trough. Labs - Thursday:  BMP and vancomycin trough Labs - Every other week:  ESR and CRP Method of administration: Per HD protocol - to be given with hemodialysis Method  of administration may be changed at the discretion of the patient and/or caregiver's ability to self-administer the medication ordered. 01/21/23 03/13/23  Odette Fraction, MD      Allergies    Shrimp (diagnostic) and Other    Review of Systems   Review of Systems  Constitutional:  Negative for chills and fever.  Respiratory:  Negative for shortness of breath.   Cardiovascular:  Negative for chest pain.  Musculoskeletal:  Negative for arthralgias.  Neurological:  Negative for seizures and syncope.     Physical Exam Updated Vital Signs There were no vitals taken for this visit. Physical Exam Vitals and nursing note reviewed.  HENT:     Head: Normocephalic.     Mouth/Throat:     Mouth: Mucous membranes are moist.  Eyes:     Extraocular Movements: Extraocular movements intact.     Pupils: Pupils are equal, round, and reactive to light.  Cardiovascular:     Rate and Rhythm: Normal rate and regular rhythm.     Pulses: Normal pulses.     Heart sounds: Normal heart sounds. No murmur heard.    No friction rub. No gallop.  Pulmonary:     Effort: Pulmonary effort is normal. No respiratory distress.     Breath sounds: No wheezing or rales.  Chest:     Chest wall: No tenderness.  Abdominal:     General: There is no distension.     Palpations: Abdomen is soft.     Tenderness: There is no abdominal tenderness. There is no guarding.  Musculoskeletal:        General: No swelling, tenderness or signs of injury.     Right lower leg: No edema.     Left lower leg: No edema.  Skin:    General: Skin is warm.     Capillary Refill: Capillary refill takes less than 2 seconds.  Neurological:     General: No focal deficit present.     Mental Status: She is alert. Mental status is at baseline.     ED Results / Procedures / Treatments   Labs (all labs ordered are listed, but only abnormal results are displayed) Labs Reviewed - No data to display  EKG None  Radiology No results found.  Procedures Procedures    Medications Ordered in ED Medications - No data to display  ED Course/ Medical Decision Making/ A&P                             Medical Decision Making Patient presents with bilateral leg cramping.  She has no calf tenderness or swelling, negative Homans' sign, low suspicion for acute DVT.  She has strong pulses both DP and PT, her pain is not worse with ambulation, she has been compliant with her Eliquis, I have low suspicion for acute limb ischemia or claudication.   Patient is an ESRD patient on dialysis, we will check her labs and an EKG to see if she has any electrolyte abnormalities that could be causing her leg discomfort.  I personally reviewed and interpreted patient's labs, creatinine mildly elevated at 7.16 however no acidosis, no hyperkalemia, no acute dialysis needs at this time.  CBC shows a stable hemoglobin of 9.6, magnesium normal, phosphorus mildly elevated at 5.2.  No acute abnormalities to explain leg cramping.  I personally reviewed and interpreted patient's EKG which shows sinus rhythm with rate 72, right bundle branch block present, no acute ischemic changes.  I reevaluated patient who has intermittent lower extremity pain described as a cramping feeling.  She is not currently in pain, I discussed with patient we will send her home on a prescription for Percocet, total of 6 tablets, for breakthrough pain and have her follow-up with her PCP outpatient.  Recommended she continue dialysis as prescribed.  Return precautions given she has any chest pain, shortness of breath, intractable leg pain, palpitations or other emergent concerns.  Patient was stable at discharge.  Problems Addressed: Bilateral leg pain: acute illness or injury that poses a threat to life or bodily functions  Amount and/or Complexity of Data Reviewed Labs: ordered. Decision-making details documented in ED Course. ECG/medicine tests: ordered and independent interpretation performed. Decision-making details documented in ED Course.  Risk Prescription drug management.         Final Clinical Impression(s) / ED Diagnoses Final diagnoses:  None    Rx / DC Orders ED Discharge Orders     None         Florene Route, MD 02/04/23 2126    Glendora Score, MD 02/05/23 1327

## 2023-02-04 NOTE — ED Notes (Signed)
[facility] called for transport back to [facility]

## 2023-02-04 NOTE — ED Notes (Signed)
Got report from [place] all questions answered. Went in to introduce self to patient. She asked if she could go home. Informed her we are still waiting for results.

## 2023-02-04 NOTE — Discharge Instructions (Signed)
You were evaluated in the emergency department for bilateral leg pain.  We did not find any dangerous cause of your leg pain.  I recommend you follow-up with your primary care doctor outpatient for further evaluation.  I am sending you home on Percocet, 6 tablets, please take for breakthrough pain.  Be careful with opioids, if you take too many you can have dangerous side effects such as decreased ability to breathe.  Return to the ED if you are having intractable leg pain or overlying skin changes including leg swelling or new rashes.

## 2023-02-04 NOTE — ED Notes (Signed)
[facility] here, transferring pt to their stretcher

## 2023-02-04 NOTE — ED Triage Notes (Signed)
Pt brought in by EMS from [facility]; complaining of bilateral leg cramps x4 days, last got hydrocodone at 1530 with no relief.   Pt did go to dialysis today but did not do an entire treatment

## 2023-02-07 ENCOUNTER — Telehealth: Payer: Self-pay

## 2023-02-07 NOTE — Telephone Encounter (Signed)
Thank you :)

## 2023-02-07 NOTE — Telephone Encounter (Signed)
[facility] provider would like to inform staff of possible non compliance with OPAT due to multiple dialysis sessions cut short.  He will fax over recent labs for review and would like advise on any further recommendations as far as vancomycin dosing. Routing to Pharmacy team and provider to make aware. Will provide pharmacy staff with labs once faxed to triage.   Eugenia Mcalpine, LPN

## 2023-02-15 ENCOUNTER — Inpatient Hospital Stay: Payer: Self-pay | Admitting: Infectious Diseases

## 2023-02-17 ENCOUNTER — Encounter: Payer: Self-pay | Admitting: Internal Medicine

## 2023-02-17 ENCOUNTER — Other Ambulatory Visit: Payer: Self-pay

## 2023-02-17 ENCOUNTER — Ambulatory Visit (INDEPENDENT_AMBULATORY_CARE_PROVIDER_SITE_OTHER): Payer: Medicare (Managed Care) | Admitting: Internal Medicine

## 2023-02-17 VITALS — BP 111/69 | HR 62 | Resp 15 | Ht 66.0 in | Wt 115.0 lb

## 2023-02-17 DIAGNOSIS — R7881 Bacteremia: Secondary | ICD-10-CM | POA: Diagnosis not present

## 2023-02-17 DIAGNOSIS — B9562 Methicillin resistant Staphylococcus aureus infection as the cause of diseases classified elsewhere: Secondary | ICD-10-CM | POA: Diagnosis not present

## 2023-02-17 DIAGNOSIS — K6812 Psoas muscle abscess: Secondary | ICD-10-CM

## 2023-02-17 DIAGNOSIS — M4620 Osteomyelitis of vertebra, site unspecified: Secondary | ICD-10-CM

## 2023-02-17 NOTE — Progress Notes (Signed)
[facility] for Infectious Disease  Patient Active Problem List   Diagnosis Date Noted   Acute on chronic osteomyelitis ([place]) 99991111   Metabolic acidosis 99991111   Chronic osteomyelitis of lumbar spine ([place]) 12/08/2022   Acute cough 12/07/2022   Fever in adult 12/02/2022   Altered mental status 11/30/2022   AMS (altered mental status) 11/30/2022   PAF (paroxysmal atrial fibrillation) ([place])    Thyroid nodule 09/17/2022   Empty sella ([place]) 09/17/2022   Discitis of lumbosacral region 07/26/2022   Hypertensive urgency 07/21/2022   Bacteremia 06/30/2022   Malnutrition of moderate degree 06/22/2022   Physical deconditioning 06/18/2022   Pressure injury of skin 06/16/2022   MRSA bacteremia 06/13/2022   ESRD on dialysis [facility])    Paroxysmal atrial flutter ([place])    Acute metabolic encephalopathy    Acute on chronic anemia with positive fecal occult  02/05/2022   Multiple polyps of sigmoid colon    Heme positive stool    Duodenitis    Candida esophagitis (HCC)    Gallbladder mass    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  01/16/2022   Discitis of lumbar region 01/16/2022   Hepatitis C without hepatic coma 01/15/2022   Sepsis due to methicillin resistant Staphylococcus aureus ([place]) 12/31/2021   Atrial flutter ([place]) 12/25/2021   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 seen on TEE on 1/17 12/24/2021   Lung nodule seen on imaging study 12/23/2021   Vertebral osteomyelitis ([place])    Sepsis due to methicillin susceptible Staphylococcus aureus ([place]) 12/17/2021   Lower back pain 12/17/2021   ESRD on hemodialysis ([place]) 12/17/2021   Encounter for screening for other viral diseases 11/03/2021   Allergy, unspecified, initial encounter 09/14/2021   Coagulation defect, unspecified ([place]) 99991111   Complication of vascular dialysis catheter 09/14/2021   Gout due to renal impairment, right ankle and foot 09/14/2021   Iron deficiency  anemia, unspecified 09/14/2021   Pain, unspecified 09/14/2021   Pruritus, unspecified 09/14/2021   Secondary hyperparathyroidism of renal origin ([place]) 09/14/2021   Shortness of breath 09/14/2021   Unspecified protein-calorie malnutrition ([place]) 09/14/2021   Vitamin D deficiency 01/02/2021   Hyperparathyroidism ([place]) 12/31/2020   CKD (chronic kidney disease), stage III ([place]) 11/10/2019   Cardiomegaly 11/05/2019   Hyperkalemia 11/05/2019   Hypokalemia 10/18/2019   Pneumonia due to COVID-19 virus 10/17/2019   Hypertension    Asthma    Gout    Hyponatremia    Multifactorial anemia-acute blood loss from bleeding HD catheter site-superimposed on anemia related to ESRD.       Subjective:    Patient ID: Diamond [name], female    DOB: [birth year], 72 y.o.   MRN: [id number]  Chief Complaint  Patient presents with   Follow-up     HPI:  Diamond [name] is a 72 y.o. female esrd on iHD, recurrent mrsa bacteremia, recent finished 4 weeks iv cefazolin 07/13/22 for mrsa bacteremia then readmitted 2 weeks later 8/22 for acute on chronic back pain with mri concerning for l2-3 paravertebral edema and still elevated crp, s/p 8 weeks vancomycin by 12/11 then with recurrent mrsa bacteremia, here for f/u   02/17/23 id clinic f/u Patient was admitted 01/16/23 again for mrsa bsi (R tetra, S bactrim) Recurrent episodes 12/2021, 06/2022, 09/2022, and 01/15/2023 Bcx this admission repeat 2/11 negative Tte thickened aortic valve; tee deferred as prolonged abx planned and bcx cleares Mri lumbar spine 01/23/23 showed persistent discistis/om changes at L2-3 and l4-5  with paravertebral phlegmon Left psoas abscess also noted to have progression from 11/2022 -- this was aspirted 2/20 by IR -- note mentioned sample sent for culture although I do not see that on epic Esrd - left AvF no sign of infection/evaluated by vascular surgery She was discharged on 8 weeks of vanc with dialysis until 03/13/23 She is here without  labs today I am tempted to place her on a very long oral tail of bactrim   Today her pain is much better than 01/2023 admission She is staying at [facility] She has no complaint In a wheel chair, accompanied by her daughter    ------------ 01/05/23 id clinic visit: Patient got readmitted in the hospital 12/25-1/03 for fever. Negative w/u. Mri lumbar spine showed no new abscess but only evolving changes. Blood cx negative Only had fever x1 episode Crp was elevated but stable  ID team saw during this recent admission as well. She receive bactrim renal dosing oral course and is here for f/u   No n/v/diarrhea/f/c/rash  Back pain not there until she gets up and start moving around. At maximum 7/10. This is most recent since she got out of rehab. During the midst of infection, the pain was very bad and constant   She feels a "band tightness" around lower chest wall last 2 days. She has been walking with a FWW. She is recently out of snf and back home a week prior to this visit  No cough  She thinks she takes bactrim 1 ss tablet daily but not sure. Her daughter prescribes it.      Prior id clinic visits ----------------- 09/15/22 id clinic f/u She finished bactrim a couple weeks ago she think but not sure Pain same as last visit No worsening appetite No f/c No numbness/tingling No urinary incontinence  10/15/22 id clinic f/u Patient had fever and found to have recurrent mrsa bacteremia and also mri lumbar spine progression of l2-4 vertebral om and ventral epidural phlegmon 10/13 bcx mrsa (R tetra; sensitive to bactrim) 10/16 bcx ngtd She was admitted to [facility] and placed on 8 weeks vancomycin with dialysis until planned 11/15/22 She has moderate back pain still but no fever chill. Same as 09/2022 No malaise/f/c No diarrhea       Allergies  Allergen Reactions   Shrimp (Diagnostic) Other (See Comments)    Unknown reaction   Other Swelling and Rash     Anesthesia - facial swelling, rash      Outpatient Medications Prior to Visit  Medication Sig Dispense Refill   acetaminophen (TYLENOL) 325 MG tablet Take 2 tablets (650 mg total) by mouth 4 (four) times daily. (Patient taking differently: Take 650 mg by mouth every 12 (twelve) hours as needed for moderate pain.)     albuterol (VENTOLIN HFA) 108 (90 Base) MCG/ACT inhaler Inhale 2 puffs into the lungs every 4 (four) hours as needed for wheezing or shortness of breath. 6.7 g 0   amiodarone (PACERONE) 200 MG tablet Take 1 tablet (200 mg total) by mouth daily.     apixaban (ELIQUIS) 2.5 MG TABS tablet Take 1 tablet (2.5 mg total) by mouth 2 (two) times daily. 60 tablet 0   cloNIDine (CATAPRES) 0.1 MG tablet Take 0.1 mg by mouth every 6 (six) hours as needed (SBP > 170).     gabapentin (NEURONTIN) 100 MG capsule Take 1 capsule (100 mg total) by mouth at bedtime. (Patient taking differently: Take 100 mg by mouth See admin instructions.  Twice a day on regular days, Tuesday, Thursday, Saturday and Sunday. Three times a day on dialysis days, Monday, Wednesday, and Friday) 30 capsule 3   Methoxy PEG-Epoetin Beta (MIRCERA IJ) Mircera     metoprolol tartrate (LOPRESSOR) 50 MG tablet Take 1 tablet (50 mg total) by mouth 2 (two) times daily.     oxyCODONE-acetaminophen (PERCOCET/ROXICET) 5-325 MG tablet Take 1 tablet by mouth every 6 (six) hours as needed for up to 6 doses for severe pain. 6 tablet 0   pantoprazole (PROTONIX) 40 MG tablet Take 1 tablet (40 mg total) by mouth daily. (Patient taking differently: Take 40 mg by mouth 2 (two) times daily.) 30 tablet 0   sevelamer (RENAGEL) 800 MG tablet Take 2 tablets by mouth 3 times a day every Tuesday Thursday and Saturday Give 2 tablets by mouth with meals every Monday Wednesday Friday and Sunday (Patient taking differently: Take 800 mg by mouth 3 (three) times daily with meals.) 60 tablet 0   vancomycin IVPB Inject 500 mg into the vein every Monday, Wednesday,  and Friday with hemodialysis. Indication:  Recurrent MRSA discitis First Dose: Yes Last Day of Therapy:  03/13/23 Labs - Sunday/Monday:  CBC/D, BMP, and vancomycin trough. Labs - Thursday:  BMP and vancomycin trough Labs - Every other week:  ESR and CRP Method of administration: Per HD protocol - to be given with hemodialysis Method of administration may be changed at the discretion of the patient and/or caregiver's ability to self-administer the medication ordered. 21 Units 0   fluticasone-salmeterol (ADVAIR DISKUS) 250-50 MCG/ACT AEPB Inhale 1 puff into the lungs in the morning and at bedtime. (Patient not taking: Reported on 02/17/2023) 14 each 0   No facility-administered medications prior to visit.     Social History   Socioeconomic History   Marital status: Widowed    Spouse name: Not on file   Number of children: Not on file   Years of education: Not on file   Highest education level: Not on file  Occupational History   Not on file  Tobacco Use   Smoking status: Never    Passive exposure: Never   Smokeless tobacco: Never  Vaping Use   Vaping Use: Never used  Substance and Sexual Activity   Alcohol use: No   Drug use: No   Sexual activity: Not Currently    Birth control/protection: Post-menopausal  Other Topics Concern   Not on file  Social History Narrative   Not on file   Social Determinants of Health   Financial Resource Strain: Not on file  Food Insecurity: No Food Insecurity (11/30/2022)   Hunger Vital Sign    Worried About Running Out of Food in the Last Year: Never true    Ran Out of Food in the Last Year: Never true  Transportation Needs: No Transportation Needs (11/30/2022)   PRAPARE - Hydrologist (Medical): No    Lack of Transportation (Non-Medical): No  Physical Activity: Not on file  Stress: Not on file  Social Connections: Moderately Integrated (07/20/2022)   Social Connection and Isolation Panel [NHANES]    Frequency  of Communication with Friends and Family: Three times a week    Frequency of Social Gatherings with Friends and Family: Three times a week    Attends Religious Services: 1 to 4 times per year    Active Member of Clubs or Organizations: Yes    Attends Archivist Meetings: 1 to 4 times per year  Marital Status: Widowed  Intimate Partner Violence: Not At Risk (11/30/2022)   Humiliation, Afraid, Rape, and Kick questionnaire    Fear of Current or Ex-Partner: No    Emotionally Abused: No    Physically Abused: No    Sexually Abused: No      Review of Systems    All other ros negative  Objective:    BP 111/69   Pulse 62   Resp 15   Ht '5\' 6"'$  (1.676 m)   Wt 115 lb (52.2 kg)   SpO2 100%   BMI 18.56 kg/m  Nursing note and vital signs reviewed.  Physical Exam     General/constitutional: no distress, pleasant; in wheel chair HEENT: Normocephalic, PER, Conj Clear, EOMI, Oropharynx clear Neck supple CV: rrr no mrg Lungs: clear to auscultation, normal respiratory effort Abd: Soft, Nontender Ext: no edema Skin: No Rash Neuro: nonfocal MSK: no peripheral joint swelling/tenderness/warmth; back spines nontender        Labs: Lab Results  Component Value Date   WBC 6.0 02/04/2023   HGB 9.6 (L) 02/04/2023   HCT 28.9 (L) 02/04/2023   MCV 97.3 02/04/2023   PLT 230 [id number]   Last metabolic panel Lab Results  Component Value Date   GLUCOSE 100 (H) 02/04/2023   NA 136 02/04/2023   K 5.0 02/04/2023   CL 94 (L) 02/04/2023   CO2 27 02/04/2023   BUN 43 (H) 02/04/2023   CREATININE 7.16 (H) 02/04/2023   GFRNONAA 6 (L) 02/04/2023   CALCIUM 9.5 02/04/2023   PHOS 5.2 (H) 02/04/2023   PROT 7.0 01/15/2023   ALBUMIN 2.3 (L) 01/26/2023   LABGLOB 3.6 10/17/2019   AGRATIO 0.6 (L) 10/17/2019   BILITOT 1.0 01/15/2023   ALKPHOS 52 01/15/2023   AST 31 01/15/2023   ALT 6 01/15/2023   ANIONGAP 15 02/04/2023       Component Value Date/Time   CRP 33.8 (H) 01/15/2023  1218   CRP 12.0 (H) 12/02/2022 0540   CRP 16.2 (H) 11/30/2022 2203    Micro:  Serology:  Imaging: I personally reviewed and incorporated into decision making  10/18 tte  1. Left ventricular ejection fraction, by estimation, is 60 to 65%. The  left ventricle has normal function. The left ventricle has no regional  wall motion abnormalities. There is mild concentric left ventricular  hypertrophy. Left ventricular diastolic  function could not be evaluated.   2. Right ventricular systolic function is normal. The right ventricular  size is normal. There is normal pulmonary artery systolic pressure.   3. Left atrial size was moderately dilated.   4. Previously noted mobile mass in the RA not visible on this study.  Right atrial size was mildly dilated.   5. The mitral valve is abnormal. Mild mitral valve regurgitation. No  evidence of mitral stenosis.   6. Tricuspid valve regurgitation is mild to moderate.   7. The aortic valve is tricuspid. There is mild calcification of the  aortic valve. Aortic valve regurgitation is trivial. Aortic valve  sclerosis/calcification is present, without any evidence of aortic  stenosis. Aortic valve area, by VTI measures 2.96  cm. Aortic valve mean gradient measures 3.0 mmHg. Aortic valve Vmax  measures 1.24 m/s.   8. Aortic dilatation noted. There is borderline dilatation of the  ascending aorta, measuring 38 mm.   9. The inferior vena cava is dilated in size with <50% respiratory  variability, suggesting right atrial pressure of 15 mmHg.    01/23/23 mri lumbar spine 1.  Transitional L5 vertebra as previously numbered. 2. L2-3 discitis/osteomyelitis with purulence in a chronic L3 body fracture. Progression of left psoas involvement at this level to an abscess appearance measuring up to 2.5 cm. There may be facet arthritis on the right at this level. Posterior epidural phlegmon/collection combines with degeneration to cause high-grade thecal sac  narrowing at this level. 3. L4-5 collapse and endplate destruction from more chronic discitis/osteomyelitis. 4. L3-4 and L4-5 advanced degenerative spinal stenosis.  10/13 mri lumbar spine 1. Findings are concerning for progression of discitis/osteomyelitis with increased soft tissue extent of infection in the paraspinal and epidural spaces. Compared to prior exam from 07/26/2022, there is increased abnormal soft tissue in the ventral epidural space at the L2-L3 and L3-L4 disc space levels resulting in moderate to severe spinal canal narrowing at these levels. There is also increased signal abnormality in the surrounding paraspinal soft tissues and bilateral psoas musculature. 2. Persistent severe spinal canal narrowing at the L5-S1 level secondary to a central disc protrusion. 3. Stable compression deformities of the L2, L3, L4, and L5 vertebral bodies with irregular endplate erosion at the L4-L5 level, best seen on CT of the L-spine obtained on 09/16/2022.  12/26 mri lumbar spine 1. Unchanged findings of discitis-osteomyelitis at L2-L3 and L4-L5 with worsening paravertebral inflammatory changes. No paravertebral or epidural abscess. 2. Unchanged mild-to-moderate L2-L3, moderate to severe L3-L4, and moderate L4-L5 spinal canal stenosis.     Assessment & Plan:   Problem List Items Addressed This Visit       Musculoskeletal and Integument   Vertebral osteomyelitis ([place]) - Primary     Other   MRSA bacteremia   Other Visit Diagnoses     Psoas abscess ([place])          09/15/22 Will see what crp is doing Doesn't appear her infection is active at this time Exam showed no tenderness and the pain suspected to be neuropathic  Will repeat lab again and evaluation in 4 weeks Advise complex pain clinic management  If crp continues to go up will consider mri  Will start gabapentin  Advise she talks to pcp about  pain clinic referral   -------- 10/15/22  assessment Recurrent bacteremia mrsa with lumbar spine om; no frank epidural abscess Back pain stable Getting vanc with iHD until 11/15/2022 Will reassess then to see if she still needs oral abx tail  Trial of lidocaine patch -- rx Increase gabapentin gradually to 300 mg qhs -- renew rx  See me around 2nd week of December  ------------ 01/05/23 id clinic f/u Recent fever 12/25 negative sepsis w/u. Crp stable high at low teens. Mri lumbar spine 12/26 no new abscess or discitis Finished 8 weeks 11/15/22 but given bactrim during that admission She is 4-5 months into abx course now  Will stop today as clinically improved   Will see her again in 4-5 weeks off antibiotics and check crp and clinicaly exam to ascertain remission. See me sooner if sign of infection    02/17/23 id clinic assessment 4th episodes mrsa bsi with relapse psoas abscess and lumbar om Back on vanc since 01/15/23 (about 2weeks after finish several months abx for same with improving mri and sx)  Will continue 8 weeks vanc until 4/14 then transition to at least  months bactrim  F/u around 4/14 to get on oral abx   I have spent a total of 40 minutes of face-to-face and non-face-to-face time, excluding clinical staff time, preparing to see patient, ordering tests and/or  medications, and provide counseling the patient    Follow-up: Return in about 29 days (around 03/18/2023).      Jabier Mutton, [facility] Infectious [facility] 02/17/2023, 4:09 PM

## 2023-02-17 NOTE — Patient Instructions (Signed)
Continue vancomycin antibiotics with your dialysis 3 times a week   You'll finish this on 03/20/23  Make sure you see me the week of 4/14. I will put you on bactrim antibiotics for at least 6 more months after that given then is the 5th time you had this infection now

## 2023-02-21 ENCOUNTER — Ambulatory Visit: Payer: Medicare (Managed Care) | Admitting: Nurse Practitioner

## 2023-02-23 ENCOUNTER — Encounter: Payer: Self-pay | Admitting: Vascular Surgery

## 2023-03-14 NOTE — Progress Notes (Deleted)
Cardiology Office Note:    Date:  03/14/2023   ID:  Diamond [name], DOB 1951/06/24, MRN [id number]  PCP:  [facility]   [facility] Providers Cardiologist:  Meriam SpragueHeather E Urijah Raynor, MD {   Referring MD: Jackie Plumsei-Bonsu, George, MD    History of Present Illness:    Diamond [name] is a 72 y.o. female with a hx of  ESRD on HD, asthma, HTN, anemia of chronic disease, atrial flutter, and admission in 12/2021 for MRSA bacteremia and IE who presents to clinic for follow-up.  Patient was admitted 12/16/21-12/30/21 due to MRSA bacteremia with hospital course complicated by atrial flutter and bleeding from HD catheter site. TEE 12/22/21 with right atrial/IVC globular mobile mass compatible with thrombotic/vegetative material. Received IV abx through 02/13/22. Incidental finding of lung nodule recommended for repeat CT in 3 mos.   Was admitted 01/2023 for discitis with MRSA bacteremia with course complicated by Afib with RVR for which she converted to NSR with amiodarone. Family declined TEE and likely was not needed due to non-concerning surface TTE and need for lifelong ABX for chronic osteo.  Today, ***   Past Medical History:  Diagnosis Date   Anemia    Asthma    Atrial flutter with rapid ventricular response (HCC) 12/25/2021   Bacteremia    ESRD (end stage renal disease) (HCC)    dialysis Tues Thurs Sat   Gout    Hepatitis C    Hypertension    PAF (paroxysmal atrial fibrillation) (HCC)    RBBB     Past Surgical History:  Procedure Laterality Date   AV FISTULA PLACEMENT Left 12/23/2021   Procedure: LEFT ARM BRACHIOBASILIC VEIN ARTERIOVENOUS (AV) FISTULA CREATION;  Surgeon: Leonie DouglasHawken, Thomas N, MD;  Location: [facility] OR;  Service: Vascular;  Laterality: Left;   BASCILIC VEIN TRANSPOSITION Left 03/08/2022   Procedure: LEFT SECOND STAGE BASILIC VEIN TRANSPOSITION;  Surgeon: Leonie DouglasHawken, Thomas N, MD;  [name];  Service: Vascular;  Laterality: Left;   BIOPSY   01/19/2022   Procedure: BIOPSY;  Surgeon: Shellia Cleverlyirigliano, Vito V, DO;  Location: [facility] ENDOSCOPY;  Service: Gastroenterology;;   CESAREAN SECTION  1989   CHOLECYSTECTOMY N/A 06/25/2022   Procedure: OPEN CHOLECYSTECTOMY;  Surgeon: Fritzi MandesAllen, Shelby L, MD;  Location: [facility] OR;  Service: General;  Laterality: N/A;   COLONOSCOPY Left 01/19/2022   Procedure: COLONOSCOPY;  Surgeon: Shellia Cleverlyirigliano, Vito V, DO;  Location: [facility];  Service: Gastroenterology;  Laterality: Left;   ESOPHAGOGASTRODUODENOSCOPY Left 01/19/2022   Procedure: ESOPHAGOGASTRODUODENOSCOPY (EGD);  Surgeon: Shellia Cleverlyirigliano, Vito V, DO;  [facility];  Service: Gastroenterology;  Laterality: Left;   INSERTION OF DIALYSIS CATHETER Right 12/23/2021   Procedure: INSERTION OF DIALYSIS CATHETER;  Surgeon: Leonie DouglasHawken, Thomas N, MD;  [facility];  Service: Vascular;  Laterality: Right;   LAPAROSCOPY N/A 06/25/2022   Procedure: STAGING LAPAROSCOPY;  Surgeon: Fritzi MandesAllen, Shelby L, MD;  [facility];  Service: General;  Laterality: N/A;   OPEN PARTIAL HEPATECTOMY  N/A 06/25/2022   Procedure: OPEN PARTIAL HEPATECTOMY;  Surgeon: Fritzi MandesAllen, Shelby L, MD;  [facility];  Service: General;  Laterality: N/A;   POLYPECTOMY  01/19/2022   Procedure: POLYPECTOMY;  Surgeon: Shellia Cleverlyirigliano, Vito V, DO;  Location: [facility];  Service: Gastroenterology;;   TEE WITHOUT CARDIOVERSION N/A 12/22/2021   Procedure: TRANSESOPHAGEAL ECHOCARDIOGRAM (TEE);  Surgeon: Jake BatheSkains, Mark C, MD;  [facility];  Service: Cardiovascular;  Laterality: N/A;   TEE WITHOUT CARDIOVERSION N/A 06/17/2022   Procedure: TRANSESOPHAGEAL ECHOCARDIOGRAM (  TEE);  Surgeon: Jodelle Red, MD;  [facility];  Service: Cardiovascular;  Laterality: N/A;   ULTRASOUND GUIDANCE FOR VASCULAR ACCESS  12/23/2021   Procedure: ULTRASOUND GUIDANCE FOR VASCULAR ACCESS;  Surgeon: Leonie Douglas, MD;  Location: [facility] OR;  Service: Vascular;;    Current Medications: No outpatient medications have been  marked as taking for the 03/21/23 encounter (Appointment) with Meriam Sprague, MD.     Allergies:   Shrimp (diagnostic) and Other   Social History   Socioeconomic History   Marital status: Widowed    Spouse name: Not on file   Number of children: Not on file   Years of education: Not on file   Highest education level: Not on file  Occupational History   Not on file  Tobacco Use   Smoking status: Never    Passive exposure: Never   Smokeless tobacco: Never  Vaping Use   Vaping Use: Never used  Substance and Sexual Activity   Alcohol use: No   Drug use: No   Sexual activity: Not Currently    Birth control/protection: Post-menopausal  Other Topics Concern   Not on file  Social History Narrative   Not on file   Social Determinants of Health   Financial Resource Strain: Not on file  Food Insecurity: No Food Insecurity (11/30/2022)   Hunger Vital Sign    Worried About Running Out of Food in the Last Year: Never true    Ran Out of Food in the Last Year: Never true  Transportation Needs: No Transportation Needs (11/30/2022)   PRAPARE - Administrator, Civil Service (Medical): No    Lack of Transportation (Non-Medical): No  Physical Activity: Not on file  Stress: Not on file  Social Connections: Moderately Integrated (07/20/2022)   Social Connection and Isolation Panel [NHANES]    Frequency of Communication with Friends and Family: Three times a week    Frequency of Social Gatherings with Friends and Family: Three times a week    Attends Religious Services: 1 to 4 times per year    Active Member of Clubs or Organizations: Yes    Attends Banker Meetings: 1 to 4 times per year    Marital Status: Widowed     Family History: The patient's family history includes Colon cancer in her brother; Hypertension in her father and mother; Lung cancer in her brother.  ROS:   Please see the history of present illness.    Review of Systems   Constitutional:  Negative for malaise/fatigue and weight loss.  HENT:  Negative for congestion and sore throat.   Eyes:  Negative for blurred vision.  Respiratory:  Positive for cough and sputum production.   Cardiovascular:  Negative for chest pain, orthopnea, claudication, leg swelling and PND.  Gastrointestinal:  Negative for heartburn and nausea.  Genitourinary:  Negative for frequency and urgency.  Musculoskeletal:  Positive for myalgias (L arm). Negative for joint pain.       Positive for swelling (L-arm)  Skin:  Negative for rash.  Neurological:  Negative for dizziness and headaches.  Endo/Heme/Allergies:  Does not bruise/bleed easily.  Psychiatric/Behavioral:  The patient is not nervous/anxious and does not have insomnia.    All other systems reviewed and are negative.  EKGs/Labs/Other Studies Reviewed:    The following studies were reviewed today: TTE March 12, 2022: IMPRESSIONS   1. Left ventricular ejection fraction, by estimation, is 60 to 65%. The  left ventricle has normal function. The left ventricle  has no regional  wall motion abnormalities. Left ventricular diastolic parameters are  consistent with Grade I diastolic  dysfunction (impaired relaxation).   2. Right ventricular systolic function is normal. The right ventricular  size is normal.   3. Highly mobile mass in the right atrium the origin/attachment site is  not well visualized. This appears smaller in size than previously noted  right atrial mass.   4. The mitral valve is normal in structure. Trivial mitral valve  regurgitation. No evidence of mitral stenosis.   5. Tricuspid valve regurgitation is mild to moderate.   6. Highly mobile 0.836 cm x 0.694 cm mass in the RVOT appears to be  attached to the annulus of the aortic valve above the right cusp, present  on previous study. The aortic valve is normal in structure. Aortic valve  regurgitation is not visualized.  Aortic valve sclerosis is present, with no  evidence of aortic valve  stenosis.   7. There is dilatation of the ascending aorta, measuring 39 mm.   8. The inferior vena cava is normal in size with greater than 50%  respiratory variability, suggesting right atrial pressure of 3 mmHg.   Comparison(s): No significant change from prior study.   CTA Abdomen 01/16/22 IMPRESSION: 1. No evidence of active GI bleed. 2. Cholelithiasis with gallbladder wall edema. If there is clinical concern for acute cholecystitis, further evaluation with ultrasound or HIDA scan recommended. 3. Masslike thickening of the gallbladder fundus may represent advanced adenomyomatosis versus neoplasm. Further evaluation with MRI without and with contrast is recommended. 4. 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 Further evaluation with lumbar spine MRI without and with contrast is recommended. 5. There is a 9 mm right middle lobe nodule. Follow-up as per recommendation of prior CT. 6. Aortic Atherosclerosis (ICD10-I70.0).  TEE: 12/22/2021  1. There is a 2.1 x 1.37 mutilobulated mobile mass attached to the RA/IVC junction which has the appearance of thrombus/vegetation in the setting of bacteremia and recently removed dialysis catheter.   2. Left ventricular ejection fraction, by estimation, is 60 to 65%. The  left ventricle has normal function. The left ventricle has no regional  wall motion abnormalities.   3. Right ventricular systolic function is normal. The right ventricular  size is normal.   4. No left atrial/left atrial appendage thrombus was detected.   5. No mitral valve vegetation. The mitral valve is degenerative. Trivial  mitral valve regurgitation. No evidence of mitral stenosis.   6. The aortic valve is normal in structure. Aortic valve regurgitation is  not visualized. No aortic stenosis is present.   7. The inferior vena cava is normal in size with greater than 50%  respiratory variability, suggesting right atrial pressure of 3 mmHg.     Echo 12/18/21  1. Left ventricular ejection fraction, by estimation, is 60 to 65%. The  left ventricle has normal function. The left ventricle has no regional  wall motion abnormalities. There is moderate hypertrophy of the basal  septum. The rest of the LV segments  demonstrate mild left ventricular hypertrophy. Left ventricular diastolic  parameters are consistent with Grade I diastolic dysfunction (impaired  relaxation).   2. Right ventricular systolic function is normal. The right ventricular  size is normal. There is normal pulmonary artery systolic pressure.   3. The mitral valve leaflets are thickened. There are fibrinous densities  visualized on the anterior mitral valve leaflet on the apical 3C view.  Given underlying bacteremia, findings concerning for vegetation formation  (versus degenerative  valve  changes). There is trivial mitral regurgitation and no evidence of valve  destruction. Recommend TEE for further evaluation.   4. The aortic valve is tricuspid. There is mild calcification of the  aortic valve. There is moderate thickening of the aortic valve. Aortic  valve regurgitation is trivial. Aortic valve sclerosis/calcification is  present, without any evidence of aortic  stenosis.   5. Aortic dilatation noted. There is mild dilatation of the ascending  aorta, measuring 40 mm.   6. There is a highly mobile, fibrinous structure visualized in the RA  most likely a eustachian valve or chiari network.   Comparison(s): Compared to prior TTE in 11/2019, the mitral valve leaflets  continue to be thickened, however, there is concern for possible  vegetation on current study. Otherwise, the TR appears mild (previously  mod-to-severe).   Conclusion(s)/Recommendation(s): Findings concerning for mitral valve  vegetation, would recommend a Transesophageal Echocardiogram for  clarification.   EKG:  EKG was not ordered today  Recent Labs: 06/12/2022: B Natriuretic Peptide  814.2 09/23/2022: TSH 3.268 01/15/2023: ALT 6 02/04/2023: BUN 43; Creatinine, Ser 7.16; Hemoglobin 9.6; Magnesium 2.2; Platelets 230; Potassium 5.0; Sodium 136  Recent Lipid Panel    Component Value Date/Time   CHOL 188 02/23/2011 0431   TRIG 122 02/23/2011 0431   HDL 84 02/23/2011 0431   CHOLHDL 2.2 Ratio 02/23/2011 0431   VLDL 24 02/23/2011 0431   LDLCALC 80 02/23/2011 0431     Risk Assessment/Calculations:    CHA2DS2-VASc Score = 4   This indicates a 4.8% annual risk of stroke. The patient's score is based upon: CHF History: 0 HTN History: 1 Diabetes History: 0 Stroke History: 0 Vascular Disease History: 1 Age Score: 1 Gender Score: 1         Physical Exam:    VS:  There were no vitals taken for this visit.    Wt Readings from Last 3 Encounters:  02/17/23 115 lb (52.2 kg)  01/26/23 115 lb 15.4 oz (52.6 kg)  12/08/22 115 lb 15.4 oz (52.6 kg)     GEN: Well nourished, well developed in no acute distress HEENT: Normal NECK: No JVD; No carotid bruits CARDIAC: RRR, no murmurs, rubs, gallops RESPIRATORY:  Clear to auscultation without rales, wheezing or rhonchi  ABDOMEN: Soft, non-tender, non-distended MUSCULOSKELETAL:  No edema; No deformity  SKIN: Warm and dry NEUROLOGIC:  Alert and oriented x 3 PSYCHIATRIC:  Normal affect   ASSESSMENT:    No diagnosis found.  PLAN:    In order of problems listed above:  #Aflutter: CHADs-vasc 4. Initially diagnosed with Aflutter with RVR during hospitalization in 12/2021 in the setting of MRSA bacteremia and endocarditis. TTE with LVEF 60-65%, moderate LVH, G1DD, normal RV. Had recurrence during hospitalization for in 01/2023 for MRSA bacteremia and converted with amiodarone. Currently on amiodarone and metop for rate/rhythm control and apixaban for AC. Currently doing well with no palpitations. 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 Suspicious for thrombus given that it occurred at prior HD line site as well as infective endocarditis given MRSA bacteremia at that time. Now on chronic ABX and AC as above. -Continue apixaban as above   #History of MRSA Bacteremia with L5-S1 Discitis: Multiple recurrences in the setting of chronic osteo. Follows with ID.    #ESRD: Follows with nephrology. On T, Thu, Sa HD -Continue scheduled HD   #  Anemia of Chronic Disease: -Management per nephrology   #HTN: *** -Continue metop 50mg  BID  #Lung Nodule: Incidental finding on CT in 12/2021. Measures 67mm in the right middle lobe. Will need serial monitoring. Discussed importance of follow-up with PCP.         Medication Adjustments/Labs and Tests Ordered: Current medicines are reviewed at length with the patient today.  Concerns regarding medicines are outlined above.  No orders of the defined types were placed in this encounter.  No orders of the defined types were placed in this encounter.   There are no Patient Instructions on file for this visit.   I,Mykaella Javier,acting as a scribe for Meriam Sprague, MD.,have documented all relevant documentation on the behalf of Meriam Sprague, MD,as directed by  Meriam Sprague, MD while in the presence of Meriam Sprague, MD.  I, Meriam Sprague, MD, have reviewed all documentation for this visit. The documentation on 03/14/23 for the exam, diagnosis, procedures, and orders are all accurate and complete.   Signed, Meriam Sprague, MD  03/14/2023 3:37 PM    [facility]

## 2023-03-15 ENCOUNTER — Encounter (HOSPITAL_COMMUNITY): Payer: Self-pay

## 2023-03-17 ENCOUNTER — Telehealth: Payer: Self-pay

## 2023-03-17 ENCOUNTER — Other Ambulatory Visit: Payer: Self-pay

## 2023-03-17 ENCOUNTER — Encounter: Payer: Self-pay | Admitting: Internal Medicine

## 2023-03-17 ENCOUNTER — Ambulatory Visit: Payer: Medicare (Managed Care) | Admitting: Internal Medicine

## 2023-03-17 ENCOUNTER — Ambulatory Visit (INDEPENDENT_AMBULATORY_CARE_PROVIDER_SITE_OTHER): Payer: Medicare (Managed Care) | Admitting: Internal Medicine

## 2023-03-17 VITALS — BP 196/117 | HR 61 | Temp 97.9°F | Resp 16

## 2023-03-17 DIAGNOSIS — A4902 Methicillin resistant Staphylococcus aureus infection, unspecified site: Secondary | ICD-10-CM

## 2023-03-17 MED ORDER — SULFAMETHOXAZOLE-TRIMETHOPRIM 800-160 MG PO TABS: 1.0000 | ORAL_TABLET | Freq: Two times a day (BID) | ORAL | 5 refills | Status: DC

## 2023-03-17 NOTE — Telephone Encounter (Addendum)
Verbal orders given to Diamond Dandy, RN at [facility] - per Dr.Vu, STOP IV Vancomycin, patient will start Bactrim ds 1 tab po BID today.   Verbal orders given to RN at [facility]-  faxed over MD order to start Bactrim DS and office note from today.   Diamond [name] Lesli Albee, CMA

## 2023-03-17 NOTE — Patient Instructions (Signed)
Please check labs today   No more IV antibiotics at the dialysis center  Will start bactrim double strength tablet, 1 tab twice a day  Plan to do at least 3-6 months if not indefinitely due to your recurrent mrsa infection   See me in 6 weeks

## 2023-03-17 NOTE — Progress Notes (Signed)
[facility]  Patient Active Problem List   Diagnosis Date Noted   Acute on chronic osteomyelitis 01/16/2023   Metabolic acidosis 01/16/2023   Chronic osteomyelitis of lumbar spine 12/08/2022   Acute cough 12/07/2022   Fever in adult 12/02/2022   Altered mental status 11/30/2022   AMS (altered mental status) 11/30/2022   PAF (paroxysmal atrial fibrillation)    Thyroid nodule 09/17/2022   Empty sella 09/17/2022   Discitis of lumbosacral region 07/26/2022   Hypertensive urgency 07/21/2022   Bacteremia 06/30/2022   Malnutrition of moderate degree 06/22/2022   Physical deconditioning 06/18/2022   Pressure injury of skin 06/16/2022   MRSA bacteremia 06/13/2022   ESRD on dialysis    Paroxysmal atrial flutter    Acute metabolic encephalopathy    Acute on chronic anemia with positive fecal occult  02/05/2022   Multiple polyps of sigmoid colon    Heme positive stool    Duodenitis    Candida esophagitis    Gallbladder mass    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  01/16/2022   Discitis of lumbar region 01/16/2022   Hepatitis C without hepatic coma 01/15/2022   Sepsis due to methicillin resistant Staphylococcus aureus 12/31/2021   Atrial flutter 12/25/2021   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 seen on TEE on 1/17 12/24/2021   Lung nodule seen on imaging study 12/23/2021   Vertebral osteomyelitis    Sepsis due to methicillin susceptible Staphylococcus aureus 12/17/2021   Lower back pain 12/17/2021   ESRD on hemodialysis 12/17/2021   Encounter for screening for other viral diseases 11/03/2021   Allergy, unspecified, initial encounter 09/14/2021   Coagulation defect, unspecified 09/14/2021   Complication of vascular dialysis catheter 09/14/2021   Gout due to renal impairment, right ankle and foot 09/14/2021   Iron deficiency anemia, unspecified 09/14/2021   Pain, unspecified 09/14/2021   Pruritus,  unspecified 09/14/2021   Secondary hyperparathyroidism of renal origin 09/14/2021   Shortness of breath 09/14/2021   Unspecified protein-calorie malnutrition 09/14/2021   Vitamin D deficiency 01/02/2021   Hyperparathyroidism 12/31/2020   CKD (chronic kidney disease), stage III 11/10/2019   Cardiomegaly 11/05/2019   Hyperkalemia 11/05/2019   Hypokalemia 10/18/2019   Pneumonia due to COVID-19 virus 10/17/2019   Hypertension    Asthma    Gout    Hyponatremia    Multifactorial anemia-acute blood loss from bleeding HD catheter site-superimposed on anemia related to ESRD.       Subjective:    Patient ID: Diamond [name], female    DOB: 03-Mar-1951, 72 y.o.   MRN: [id number]  Chief Complaint  Patient presents with   Follow-up    Vertebral osteomyelitis [facility])       HPI:  Diamond [name] is a 72 y.o. female esrd on iHD, recurrent mrsa bacteremia, recent finished 4 weeks iv cefazolin 07/13/22 for mrsa bacteremia then readmitted 2 weeks later 8/22 for acute on chronic back pain with mri concerning for l2-3 paravertebral edema and still elevated crp, s/p 8 weeks vancomycin by 12/11 then with recurrent mrsa bacteremia, here for f/u  03/17/23 id clinic f/u Doing well in wheel chair non-ambulatory which is chronic even before infection Minimal back pain No f/c Coming to end of iv abx vanc Plan to transition to oral suppressive bactrim   02/17/23 id clinic f/u Patient was admitted 01/16/23 again for mrsa bsi (R tetra, S bactrim) Recurrent episodes 12/2021, 06/2022, 09/2022, and 01/15/2023 Bcx this admission repeat  2/11 negative Tte thickened aortic valve; tee deferred as prolonged abx planned and bcx cleares Mri lumbar spine 01/23/23 showed persistent discistis/om changes at L2-3 and l4-5 with paravertebral phlegmon Left psoas abscess also noted to have progression from 11/2022 -- this was aspirted 2/20 by IR -- note mentioned sample sent for culture although I do not see that on epic Esrd -  left AvF no sign of infection/evaluated by vascular surgery She was discharged on 8 weeks of vanc with dialysis until 03/13/23 She is here without labs today I am tempted to place her on a very long oral tail of bactrim   Today her pain is much better than 01/2023 admission She is staying at [facility] She has no complaint In a wheel chair, accompanied by her daughter    ------------ 01/05/23 id clinic visit: Patient got readmitted in the hospital 12/25-1/03 for fever. Negative w/u. Mri lumbar spine showed no new abscess but only evolving changes. Blood cx negative Only had fever x1 episode Crp was elevated but stable  ID team saw during this recent admission as well. She receive bactrim renal dosing oral course and is here for f/u   No n/v/diarrhea/f/c/rash  Back pain not there until she gets up and start moving around. At maximum 7/10. This is most recent since she got out of rehab. During the midst of infection, the pain was very bad and constant   She feels a "band tightness" around lower chest wall last 2 days. She has been walking with a FWW. She is recently out of snf and back home a week prior to this visit  No cough  She thinks she takes bactrim 1 ss tablet daily but not sure. Her daughter prescribes it.      Prior id clinic visits ----------------- 09/15/22 id clinic f/u She finished bactrim a couple weeks ago she think but not sure Pain same as last visit No worsening appetite No f/c No numbness/tingling No urinary incontinence  10/15/22 id clinic f/u Patient had fever and found to have recurrent mrsa bacteremia and also mri lumbar spine progression of l2-4 vertebral om and ventral epidural phlegmon 10/13 bcx mrsa (R tetra; sensitive to bactrim) 10/16 bcx ngtd She was admitted to [place] and placed on 8 weeks vancomycin with dialysis until planned 11/15/22 She has moderate back pain still but no fever chill. Same as 09/2022 No malaise/f/c No  diarrhea       Allergies  Allergen Reactions   Shrimp (Diagnostic) Other (See Comments)    Unknown reaction   Other Swelling and Rash    Anesthesia - facial swelling, rash      Outpatient Medications Prior to Visit  Medication Sig Dispense Refill   acetaminophen (TYLENOL) 325 MG tablet Take 2 tablets (650 mg total) by mouth 4 (four) times daily. (Patient taking differently: Take 650 mg by mouth every 12 (twelve) hours as needed for moderate pain.)     albuterol (VENTOLIN HFA) 108 (90 Base) MCG/ACT inhaler Inhale 2 puffs into the lungs every 4 (four) hours as needed for wheezing or shortness of breath. 6.7 g 0   amiodarone (PACERONE) 200 MG tablet Take 1 tablet (200 mg total) by mouth daily.     apixaban (ELIQUIS) 2.5 MG TABS tablet Take 1 tablet (2.5 mg total) by mouth 2 (two) times daily. 60 tablet 0   cloNIDine (CATAPRES) 0.1 MG tablet Take 0.1 mg by mouth every 6 (six) hours as needed (SBP > 170).  fluticasone-salmeterol (ADVAIR DISKUS) 250-50 MCG/ACT AEPB Inhale 1 puff into the lungs in the morning and at bedtime. (Patient not taking: Reported on 02/17/2023) 14 each 0   gabapentin (NEURONTIN) 100 MG capsule Take 1 capsule (100 mg total) by mouth at bedtime. (Patient taking differently: Take 100 mg by mouth See admin instructions. Twice a day on regular days, Tuesday, Thursday, Saturday and Sunday. Three times a day on dialysis days, Monday, Wednesday, and Friday) 30 capsule 3   Methoxy PEG-Epoetin Beta (MIRCERA IJ) Mircera     metoprolol tartrate (LOPRESSOR) 50 MG tablet Take 1 tablet (50 mg total) by mouth 2 (two) times daily.     oxyCODONE-acetaminophen (PERCOCET/ROXICET) 5-325 MG tablet Take 1 tablet by mouth every 6 (six) hours as needed for up to 6 doses for severe pain. 6 tablet 0   pantoprazole (PROTONIX) 40 MG tablet Take 1 tablet (40 mg total) by mouth daily. (Patient taking differently: Take 40 mg by mouth 2 (two) times daily.) 30 tablet 0   sevelamer (RENAGEL) 800 MG  tablet Take 2 tablets by mouth 3 times a day every Tuesday Thursday and Saturday Give 2 tablets by mouth with meals every Monday Wednesday Friday and Sunday (Patient taking differently: Take 800 mg by mouth 3 (three) times daily with meals.) 60 tablet 0   No facility-administered medications prior to visit.     Social History   Socioeconomic History   Marital status: Widowed    Spouse name: Not on file   Number of children: Not on file   Years of education: Not on file   Highest education level: Not on file  Occupational History   Not on file  Tobacco Use   Smoking status: Never    Passive exposure: Never   Smokeless tobacco: Never  Vaping Use   Vaping Use: Never used  Substance and Sexual Activity   Alcohol use: No   Drug use: No   Sexual activity: Not Currently    Birth control/protection: Post-menopausal  Other Topics Concern   Not on file  Social History Narrative   Not on file   Social Determinants of Health   Financial Resource Strain: Not on file  Food Insecurity: No Food Insecurity (11/30/2022)   Hunger Vital Sign    Worried About Running Out of Food in the Last Year: Never true    Ran Out of Food in the Last Year: Never true  Transportation Needs: No Transportation Needs (11/30/2022)   PRAPARE - Administrator, Civil ServiceTransportation    Lack of Transportation (Medical): No    Lack of Transportation (Non-Medical): No  Physical Activity: Not on file  Stress: Not on file  Social Connections: Moderately Integrated (07/20/2022)   Social Connection and Isolation Panel [NHANES]    Frequency of Communication with Friends and Family: Three times a week    Frequency of Social Gatherings with Friends and Family: Three times a week    Attends Religious Services: 1 to 4 times per year    Active Member of Clubs or Organizations: Yes    Attends BankerClub or Organization Meetings: 1 to 4 times per year    Marital Status: Widowed  Intimate Partner Violence: Not At Risk (11/30/2022)   Humiliation,  Afraid, Rape, and Kick questionnaire    Fear of Current or Ex-Partner: No    Emotionally Abused: No    Physically Abused: No    Sexually Abused: No      Review of Systems    All other ros negative  Objective:  BP (!) 196/117   Pulse 61   Temp 97.9 F (36.6 C) (Temporal)   Resp 16   SpO2 99%  Nursing note and vital signs reviewed.  Physical Exam     General/constitutional: no distress, pleasant; in wheel chair HEENT: Normocephalic, PER, Conj Clear, EOMI, Oropharynx clear Neck supple CV: rrr no mrg Lungs: clear to auscultation, normal respiratory effort Abd: Soft, Nontender Ext: no edema Skin: No Rash Neuro: nonfocal MSK: no peripheral joint swelling/tenderness/warmth; back spines nontender        Labs: Lab Results  Component Value Date   WBC 6.0 02/04/2023   HGB 9.6 (L) 02/04/2023   HCT 28.9 (L) 02/04/2023   MCV 97.3 02/04/2023   PLT 230 02/04/2023   Last metabolic panel Lab Results  Component Value Date   GLUCOSE 100 (H) 02/04/2023   NA 136 02/04/2023   K 5.0 02/04/2023   CL 94 (L) 02/04/2023   CO2 27 02/04/2023   BUN 43 (H) 02/04/2023   CREATININE 7.16 (H) 02/04/2023   GFRNONAA 6 (L) 02/04/2023   CALCIUM 9.5 02/04/2023   PHOS 5.2 (H) 02/04/2023   PROT 7.0 01/15/2023   ALBUMIN 2.3 (L) 01/26/2023   LABGLOB 3.6 10/17/2019   AGRATIO 0.6 (L) 10/17/2019   BILITOT 1.0 01/15/2023   ALKPHOS 52 01/15/2023   AST 31 01/15/2023   ALT 6 01/15/2023   ANIONGAP 15 02/04/2023       Component Value Date/Time   CRP 33.8 (H) 01/15/2023 1218   CRP 12.0 (H) 12/02/2022 0540   CRP 16.2 (H) 11/30/2022 2203    Micro:  Serology:  Imaging: I personally reviewed and incorporated into decision making  10/18 tte  1. Left ventricular ejection fraction, by estimation, is 60 to 65%. The  left ventricle has normal function. The left ventricle has no regional  wall motion abnormalities. There is mild concentric left ventricular  hypertrophy. Left  ventricular diastolic  function could not be evaluated.   2. Right ventricular systolic function is normal. The right ventricular  size is normal. There is normal pulmonary artery systolic pressure.   3. Left atrial size was moderately dilated.   4. Previously noted mobile mass in the RA not visible on this study.  Right atrial size was mildly dilated.   5. The mitral valve is abnormal. Mild mitral valve regurgitation. No  evidence of mitral stenosis.   6. Tricuspid valve regurgitation is mild to moderate.   7. The aortic valve is tricuspid. There is mild calcification of the  aortic valve. Aortic valve regurgitation is trivial. Aortic valve  sclerosis/calcification is present, without any evidence of aortic  stenosis. Aortic valve area, by VTI measures 2.96  cm. Aortic valve mean gradient measures 3.0 mmHg. Aortic valve Vmax  measures 1.24 m/s.   8. Aortic dilatation noted. There is borderline dilatation of the  ascending aorta, measuring 38 mm.   9. The inferior vena cava is dilated in size with <50% respiratory  variability, suggesting right atrial pressure of 15 mmHg.    01/23/23 mri lumbar spine 1. Transitional L5 vertebra as previously numbered. 2. L2-3 discitis/osteomyelitis with purulence in a chronic L3 body fracture. Progression of left psoas involvement at this level to an abscess appearance measuring up to 2.5 cm. There may be facet arthritis on the right at this level. Posterior epidural phlegmon/collection combines with degeneration to cause high-grade thecal sac narrowing at this level. 3. L4-5 collapse and endplate destruction from more chronic discitis/osteomyelitis. 4. L3-4 and L4-5 advanced degenerative  spinal stenosis.  10/13 mri lumbar spine 1. Findings are concerning for progression of discitis/osteomyelitis with increased soft tissue extent of infection in the paraspinal and epidural spaces. Compared to prior exam from 07/26/2022, there is increased  abnormal soft tissue in the ventral epidural space at the L2-L3 and L3-L4 disc space levels resulting in moderate to severe spinal canal narrowing at these levels. There is also increased signal abnormality in the surrounding paraspinal soft tissues and bilateral psoas musculature. 2. Persistent severe spinal canal narrowing at the L5-S1 level secondary to a central disc protrusion. 3. Stable compression deformities of the L2, L3, L4, and L5 vertebral bodies with irregular endplate erosion at the L4-L5 level, best seen on CT of the L-spine obtained on 09/16/2022.  12/26 mri lumbar spine 1. Unchanged findings of discitis-osteomyelitis at L2-L3 and L4-L5 with worsening paravertebral inflammatory changes. No paravertebral or epidural abscess. 2. Unchanged mild-to-moderate L2-L3, moderate to severe L3-L4, and moderate L4-L5 spinal canal stenosis.     Assessment & Plan:   Problem List Items Addressed This Visit   None Visit Diagnoses     MRSA infection    -  Primary      09/15/22 Will see what crp is doing Doesn't appear her infection is active at this time Exam showed no tenderness and the pain suspected to be neuropathic  Will repeat lab again and evaluation in 4 weeks Advise complex pain clinic management  If crp continues to go up will consider mri  Will start gabapentin  Advise she talks to pcp about  pain clinic referral   -------- 10/15/22 assessment Recurrent bacteremia mrsa with lumbar spine om; no frank epidural abscess Back pain stable Getting vanc with iHD until 11/15/2022 Will reassess then to see if she still needs oral abx tail  Trial of lidocaine patch -- rx Increase gabapentin gradually to 300 mg qhs -- renew rx  See me around 2nd week of December  ------------ 01/05/23 id clinic f/u Recent fever 12/25 negative sepsis w/u. Crp stable high at low teens. Mri lumbar spine 12/26 no new abscess or discitis Finished 8 weeks 11/15/22 but given bactrim  during that admission She is 4-5 months into abx course now  Will stop today as clinically improved   Will see her again in 4-5 weeks off antibiotics and check crp and clinicaly exam to ascertain remission. See me sooner if sign of infection    02/17/23 id clinic assessment 4th episodes mrsa bsi with relapse psoas abscess and lumbar om Back on vanc since 01/15/23 (about 2weeks after finish several months abx for same with improving mri and sx)  Will continue 8 weeks vanc until 4/14 then transition to at least  months bactrim  F/u around 4/14 to get on oral abx  03/17/23 id assessment Will transition to bactrim ds 1 tab po bid today  Plan at least 3 months and if tolerate I might push indefinitely given several relapse in the past year  F/u 6 weeks repeat crp and see how she does   I have spent a total of 30 minutes of face-to-face and non-face-to-face time, excluding clinical staff time, preparing to see patient, ordering tests and/or medications, and provide counseling the patient    Follow-up: Return in about 6 weeks (around 04/28/2023).      Raymondo Band, MD [facility] 03/17/2023, 2:00 PM

## 2023-03-18 ENCOUNTER — Encounter (HOSPITAL_COMMUNITY): Payer: Self-pay

## 2023-03-18 LAB — C-REACTIVE PROTEIN: CRP: 41.1 mg/L — ABNORMAL HIGH (ref <8.0)

## 2023-03-21 ENCOUNTER — Ambulatory Visit: Payer: Medicare (Managed Care) | Admitting: Cardiology

## 2023-03-24 IMAGING — CT CT CHEST W/O CM
2 of 3 series · 13 of 36 positions shown, 16 images · non-contrast
Comparison: Chest radiographs 12/17/2021, CT chest 10/15/2010

CLINICAL DATA: Follow-up 6-8 mm lung nodule left lower lobe present
on CT chest in 9288. Shortness of breath. Follow-up abnormal x-ray.



[Series 3: chest w/o 2mm st · axial · non-contrast · 0.72mm/px · z∈[+506,+778]mm · 10 of 160 slices shown, 13 images]
[im 12/160  mediastinal]
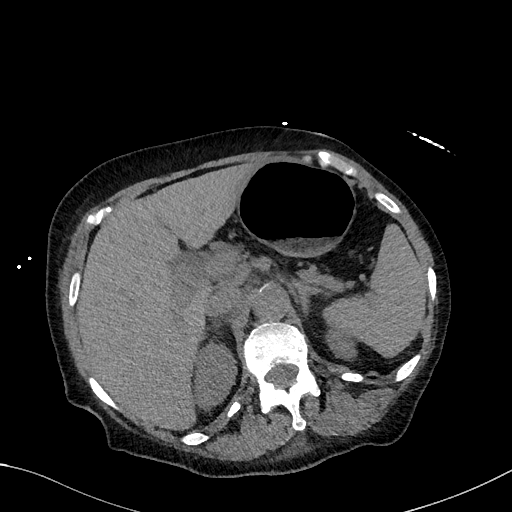
[im 12/160  lung]
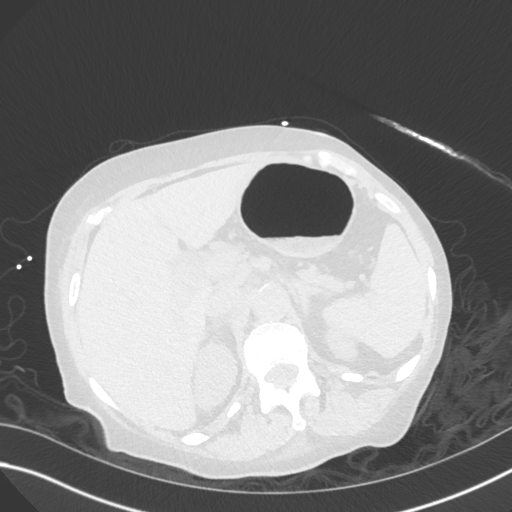
[im 24/160  lung]
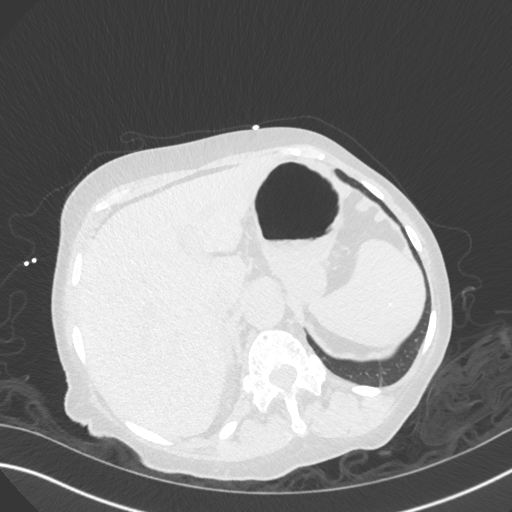
[im 42/160  lung]
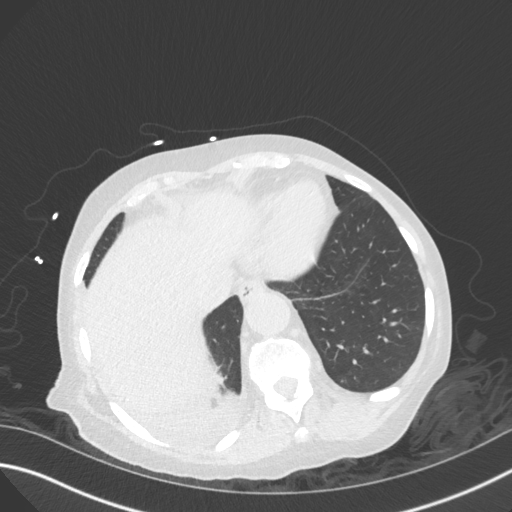
[im 59/160  lung]
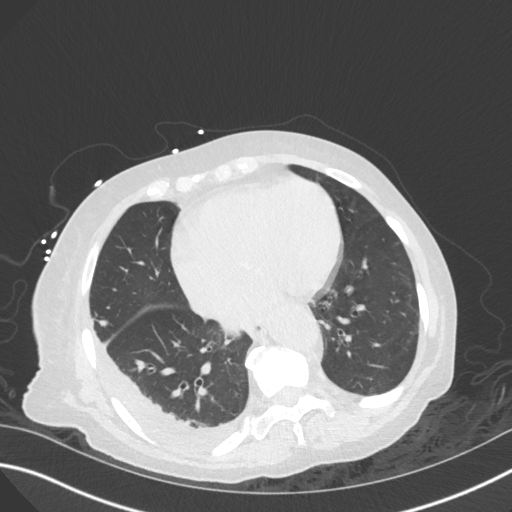
[im 71/160  mediastinal]
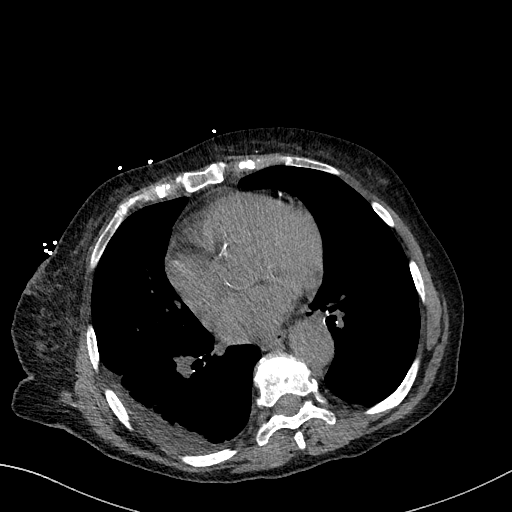
[im 71/160  lung]
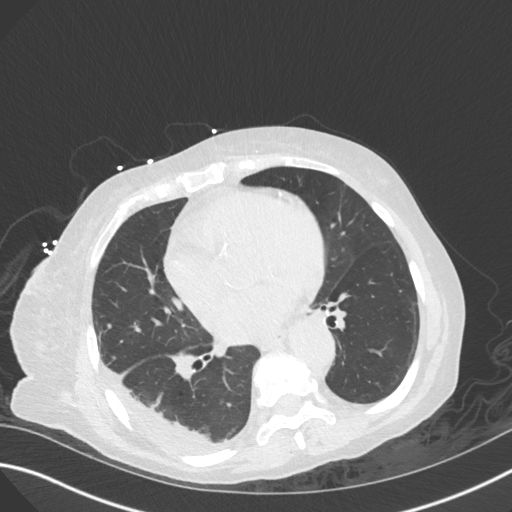
[im 89/160  lung]
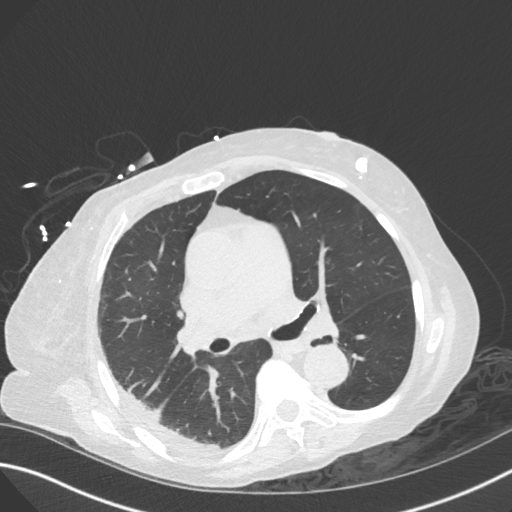
[im 101/160  lung]
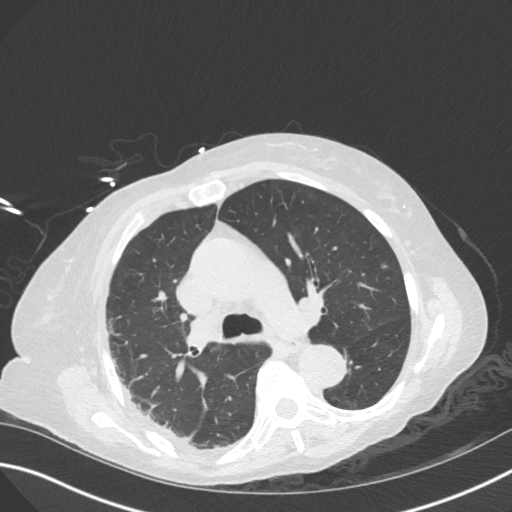
[im 118/160  lung]
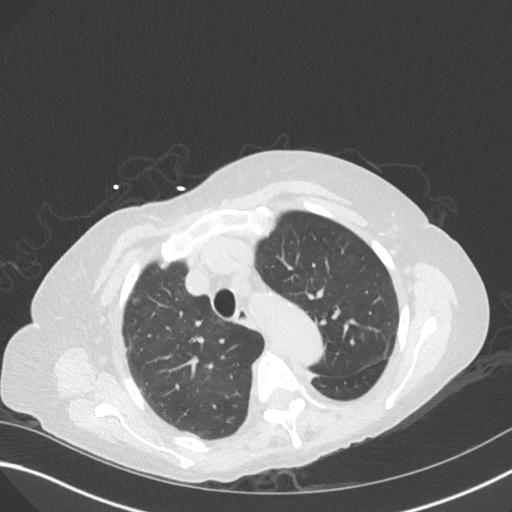
[im 136/160  mediastinal]
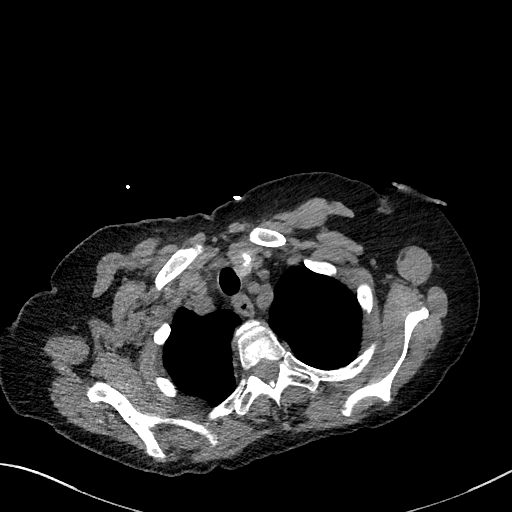
[im 136/160  lung]
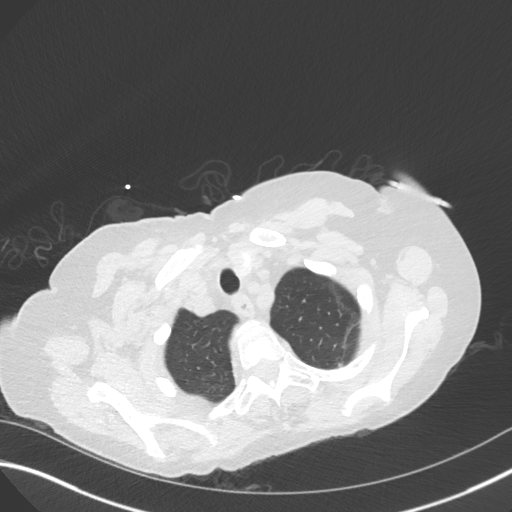
[im 148/160  lung]
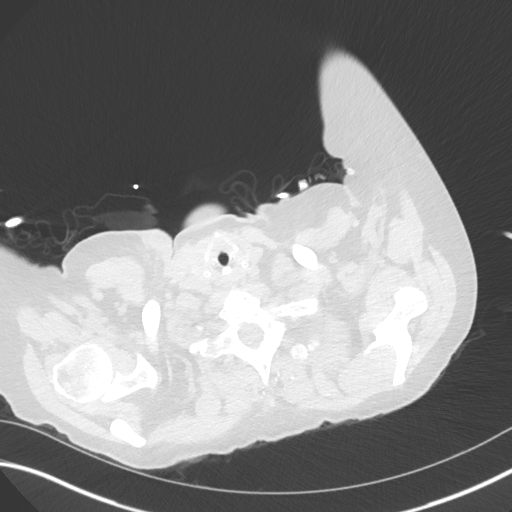

[Series 5: chest w/o 2mm st cor · coronal · non-contrast · 0.62mm/px · 3 of 151 slices shown]
[im 31/151  lung]
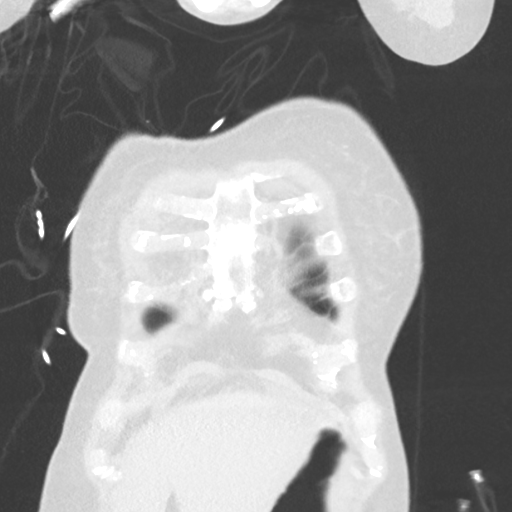
[im 61/151  lung]
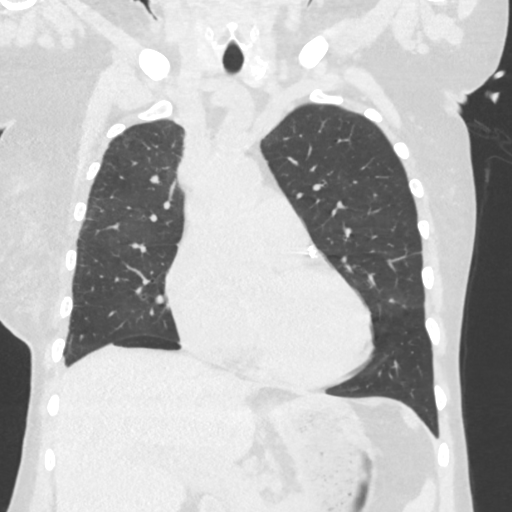
[im 91/151  lung]
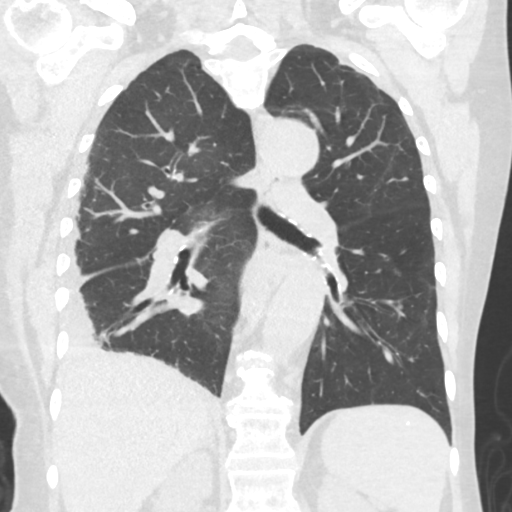

[13 of 36 positions shown; findings below may reference images not displayed]

FINDINGS: Cardiovascular: Heart size is mildly enlarged. Coronary artery and
aortic root calcifications are noted. The ascending aorta measures
up to 4.0 cm in caliber, borderline aneurysmal. This is increased
from 3.6 cm on prior CT. Moderate calcifications within the aortic
arch.

Mediastinum/Nodes: No axillary, mediastinal, or hilar pathologically
enlarged lymph nodes by CT criteria. 9 mm short axis inferior right
paratracheal lymph node is similar to prior. The thyroid gland is
grossly unremarkable. The esophagus follows a normal course of
normal caliber.

Lungs/Pleura: The central airways are patent. A 6 x 5 mm left lower
lobe pulmonary nodule is unchanged from 10/15/2010 and benign.

There is an elongated, tubular soft tissue density within the
lateral middle lobe (axial 100/4, sagittal 34/6) measuring up to 7 x
7 x 14 mm (transverse by AP by craniocaudal). Due to the elongated
shape, it is unclear whether this may represent a vascular lesion or
airway impaction, however this does not appear to involve an airway.
This is new compared to the prior remote 10/15/2010 CT.

There is a mild-to-moderate right pleural effusion. Question mild
nodularity of the adjacent posterior right lower lobe pleura,
however this appearance may just be secondary to subsegmental
atelectasis. Recommend attention on follow-up.

There is a 4 mm nodule within the lingula that may be slightly more
distinct, however previously at least a ground-glass density was
seen in this same region on remote 10/15/2010 CT (current CT axial
95/4). There are two nearby peripheral left upper lobe apparent
cystic/cavitary nodules measuring approximately 4 mm (axial image
62) and 4 mm (axial image 58). These may be secondary to an
infectious process.

Punctate calcified granuloma is seen at the far inferior aspect of
the left lower lobe (axial image 135), benign. Punctate
posteromedial left lower lobe 3 mm nodule (axial image 118).
Punctate benign calcified granuloma within left lower lobe (axial
image 116).

There is curvilinear subsegmental atelectasis versus scarring within
the peripheral superior aspect of the left upper lobe.

No pneumothorax.

Upper Abdomen: The gallbladder is partially visualized. Therer
appears to be a low-density gallstone measuring at least 19 mm. The
gallbladder wall is not well evaluated on the current noncontrast CT
with only partial visualization of the gallbladder. If there are
symptoms of right upper quadrant pain, a right upper quadrant
abdominal ultrasound may help for further characterization. There is
a 2 mm right upper pole renal stone. There are punctate
calcifications within the lateral aspect of the spleen.

Musculoskeletal: There is again nonspecific but apparently stable
mild nodularity within the right breast just deep to the right
nipple. There is again benign dystrophic calcification within the
left breast similar to prior. Mild to moderate multilevel
degenerative disc changes of the visualized thoracic spine.
IMPRESSION: Compared to 10/15/2010:

1. The previously seen left lower lobe 5 mm nodule is unchanged from
prior, demonstrating 11 year stability. This is benign.
2. There is an elongated, tubular soft tissue density within the
lateral middle lobe measuring 7 x 7 x 14 mm. This is indeterminate
and could represent a vascular abnormality or a pulmonary nodule.
Recommend follow-up CT in 3 months to assess stability. A PET-CT
also may be considered for further evaluation. If the nodules are
stable at time of repeat CT, then future CT at 18-24 months (from
today's scan) is considered optional for low-risk patients, but is
recommended for high-risk patients. This recommendation follows the
consensus statement: Guidelines for Management of Incidental
Pulmonary Nodules Detected on CT Images: From the [HOSPITAL]
3. Additional left pulmonary nodules measuring up to 4 mm. Recommend
attention on follow-up.
4. Mild-to-moderate right pleural effusion. Question mild nodularity
of the adjacent posterior right lower lobe pleura, however this
appearance may just be secondary to subsegmental atelectasis.
Recommend attention on follow-up.

## 2023-03-25 IMAGING — DX DG CHEST 1V PORT
1 series · 1 of 1 positions shown · non-contrast
Comparison: 12/18/2011

CLINICAL DATA: Status post dialysis catheter placement

EXAM:
PORTABLE CHEST 1 VIEW

[chest]
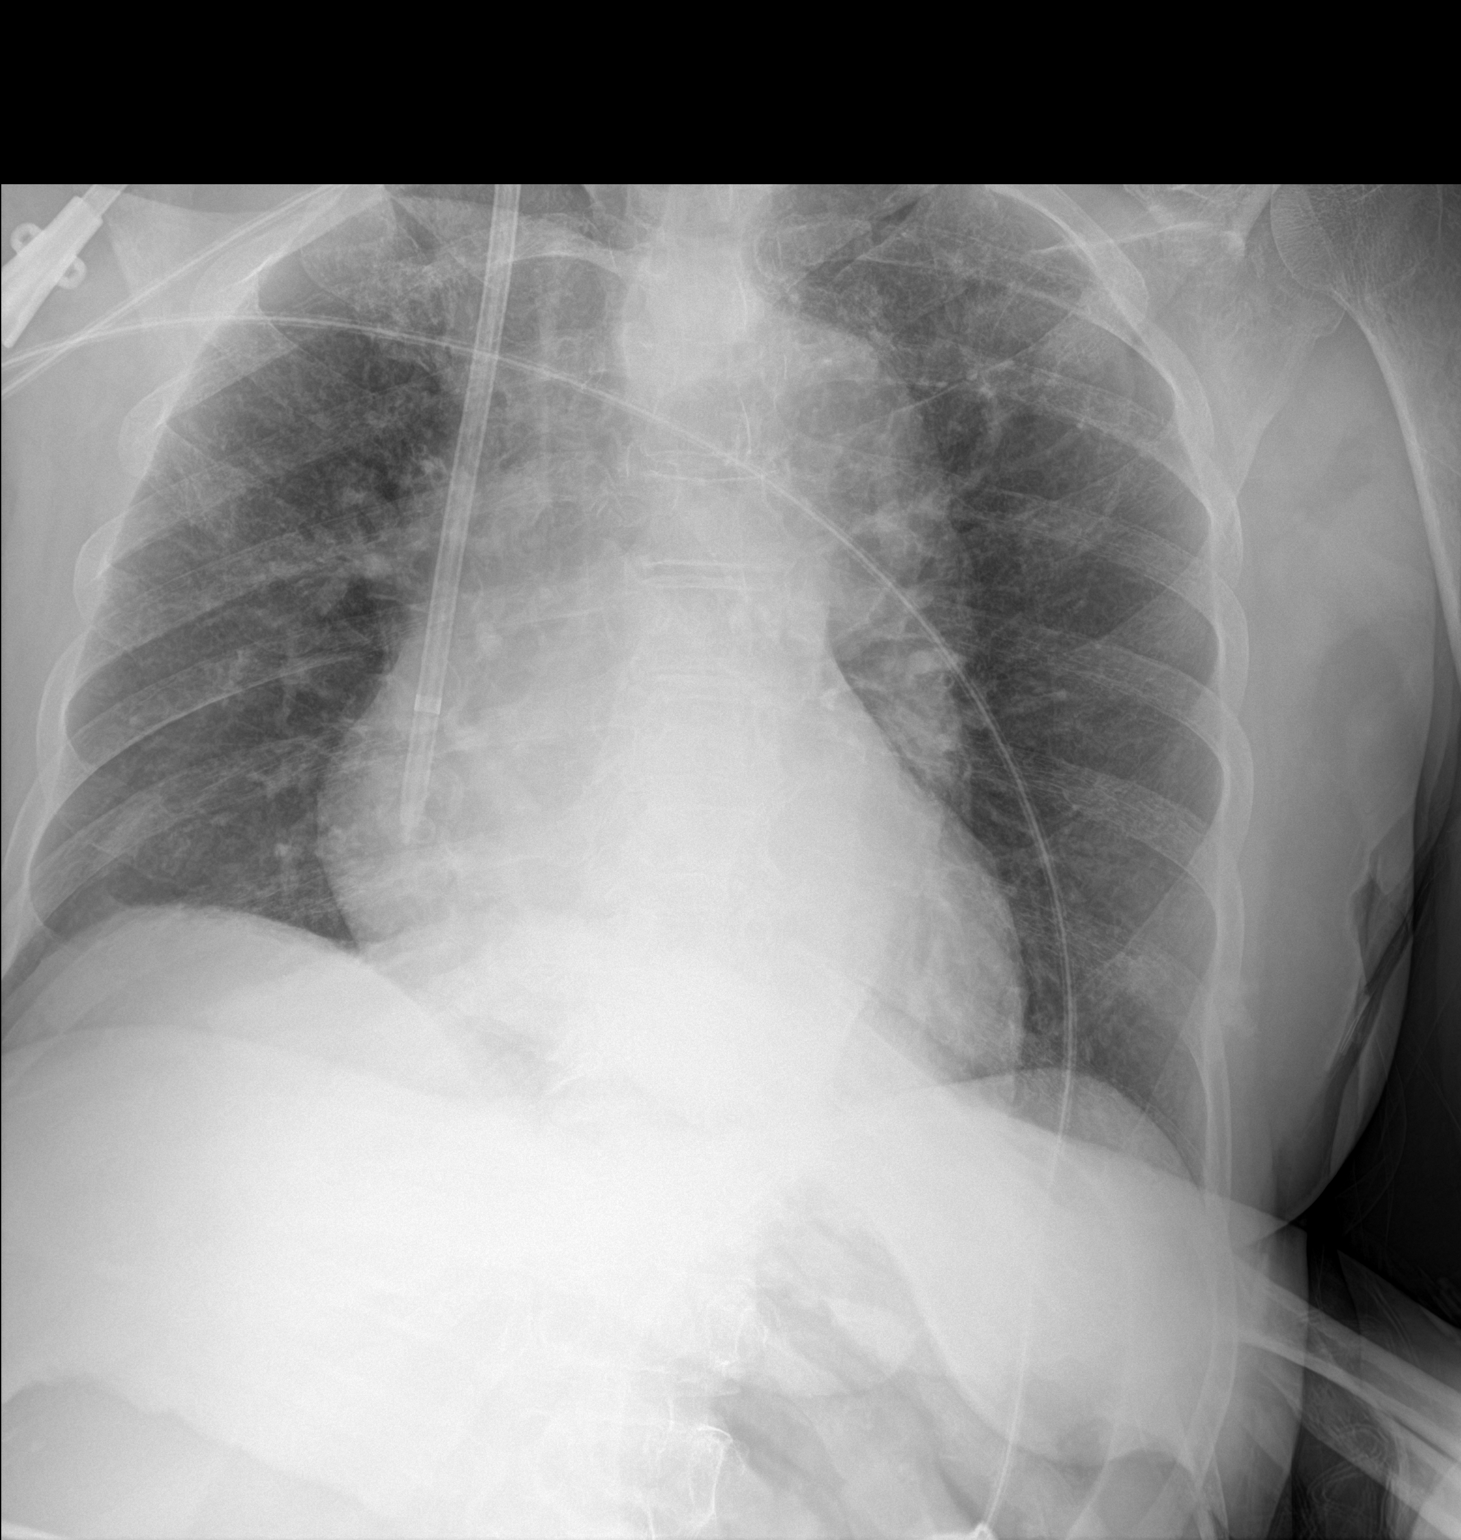

[1 of 1 positions shown; findings below may reference images not displayed]

FINDINGS: Right jugular dialysis catheter is noted in satisfactory position.
Cardiac shadow is mildly enlarged but stable. Tortuous thoracic
aorta is noted. Lungs are clear. No pneumothorax is noted.
IMPRESSION: No acute abnormality following dialysis catheter placement.

## 2023-04-28 ENCOUNTER — Other Ambulatory Visit: Payer: Self-pay

## 2023-04-28 ENCOUNTER — Ambulatory Visit (INDEPENDENT_AMBULATORY_CARE_PROVIDER_SITE_OTHER): Payer: Medicare (Managed Care) | Admitting: Internal Medicine

## 2023-04-28 DIAGNOSIS — M869 Osteomyelitis, unspecified: Secondary | ICD-10-CM | POA: Diagnosis not present

## 2023-04-28 DIAGNOSIS — A4902 Methicillin resistant Staphylococcus aureus infection, unspecified site: Secondary | ICD-10-CM

## 2023-04-28 NOTE — Progress Notes (Signed)
[facility]  Patient Active Problem List   Diagnosis Date Noted   Acute on chronic osteomyelitis (HCC) 01/16/2023   Metabolic acidosis 01/16/2023   Chronic osteomyelitis of lumbar spine (HCC) 12/08/2022   Acute cough 12/07/2022   Fever in adult 12/02/2022   Altered mental status 11/30/2022   AMS (altered mental status) 11/30/2022   PAF (paroxysmal atrial fibrillation) (HCC)    Thyroid nodule 09/17/2022   Empty sella (HCC) 09/17/2022   Discitis of lumbosacral region 07/26/2022   Hypertensive urgency 07/21/2022   Bacteremia 06/30/2022   Malnutrition of moderate degree 06/22/2022   Physical deconditioning 06/18/2022   Pressure injury of skin 06/16/2022   MRSA bacteremia 06/13/2022   ESRD on dialysis [facility])    Paroxysmal atrial flutter (HCC)    Acute metabolic encephalopathy    Acute on chronic anemia with positive fecal occult  02/05/2022   Multiple polyps of sigmoid colon    Heme positive stool    Duodenitis    Candida esophagitis (HCC)    Gallbladder mass    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  01/16/2022   Discitis of lumbar region 01/16/2022   Hepatitis C without hepatic coma 01/15/2022   Sepsis due to methicillin resistant Staphylococcus aureus (HCC) 12/31/2021   Atrial flutter (HCC) 12/25/2021   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 seen on TEE on 1/17 12/24/2021   Lung nodule seen on imaging study 12/23/2021   Vertebral osteomyelitis (HCC)    Sepsis due to methicillin susceptible Staphylococcus aureus (HCC) 12/17/2021   Lower back pain 12/17/2021   ESRD on hemodialysis (HCC) 12/17/2021   Encounter for screening for other viral diseases 11/03/2021   Allergy, unspecified, initial encounter 09/14/2021   Coagulation defect, unspecified (HCC) 09/14/2021   Complication of vascular dialysis catheter 09/14/2021   Gout due to renal impairment, right ankle and foot 09/14/2021   Iron deficiency  anemia, unspecified 09/14/2021   Pain, unspecified 09/14/2021   Pruritus, unspecified 09/14/2021   Secondary hyperparathyroidism of renal origin (HCC) 09/14/2021   Shortness of breath 09/14/2021   Unspecified protein-calorie malnutrition (HCC) 09/14/2021   Vitamin D deficiency 01/02/2021   Hyperparathyroidism (HCC) 12/31/2020   CKD (chronic kidney disease), stage III (HCC) 11/10/2019   Cardiomegaly 11/05/2019   Hyperkalemia 11/05/2019   Hypokalemia 10/18/2019   Pneumonia due to COVID-19 virus 10/17/2019   Hypertension    Asthma    Gout    Hyponatremia    Multifactorial anemia-acute blood loss from bleeding HD catheter site-superimposed on anemia related to ESRD.       Subjective:    Patient ID: Diamond [name], female    DOB: 1951/11/16, 72 y.o.   MRN: [id number]  No chief complaint on file.    HPI:  Diamond [name] is a 72 y.o. female esrd on iHD, recurrent mrsa bacteremia, recent finished 4 weeks iv cefazolin 07/13/22 for mrsa bacteremia then readmitted 2 weeks later 8/22 for acute on chronic back pain with mri concerning for l2-3 paravertebral edema and still elevated crp, s/p 8 weeks vancomycin by 12/11 then with recurrent mrsa bacteremia, here for f/u  03/17/23 id clinic f/u Doing well in wheel chair non-ambulatory which is chronic even before infection Minimal back pain No f/c Coming to end of iv abx vanc Plan to transition to oral suppressive bactrim   02/17/23 id clinic f/u Patient was admitted 01/16/23 again for mrsa bsi (R tetra, S bactrim) Recurrent episodes 12/2021, 06/2022, 09/2022, and 01/15/2023  Bcx this admission repeat 2/11 negative Tte thickened aortic valve; tee deferred as prolonged abx planned and bcx cleares Mri lumbar spine 01/23/23 showed persistent discistis/om changes at L2-3 and l4-5 with paravertebral phlegmon Left psoas abscess also noted to have progression from 11/2022 -- this was aspirted 2/20 by IR -- note mentioned sample sent for culture  although I do not see that on epic Esrd - left AvF no sign of infection/evaluated by vascular surgery She was discharged on 8 weeks of vanc with dialysis until 03/13/23 She is here without labs today I am tempted to place her on a very long oral tail of bactrim   Today her pain is much better than 01/2023 admission She is staying at [facility] She has no complaint In a wheel chair, accompanied by her daughter    ------------ 01/05/23 id clinic visit: Patient got readmitted in the hospital 12/25-1/03 for fever. Negative w/u. Mri lumbar spine showed no new abscess but only evolving changes. Blood cx negative Only had fever x1 episode Crp was elevated but stable  ID team saw during this recent admission as well. She receive bactrim renal dosing oral course and is here for f/u   No n/v/diarrhea/f/c/rash  Back pain not there until she gets up and start moving around. At maximum 7/10. This is most recent since she got out of rehab. During the midst of infection, the pain was very bad and constant   She feels a "band tightness" around lower chest wall last 2 days. She has been walking with a FWW. She is recently out of snf and back home a week prior to this visit  No cough  She thinks she takes bactrim 1 ss tablet daily but not sure. Her daughter prescribes it.      Prior id clinic visits ----------------- 09/15/22 id clinic f/u She finished bactrim a couple weeks ago she think but not sure Pain same as last visit No worsening appetite No f/c No numbness/tingling No urinary incontinence  10/15/22 id clinic f/u Patient had fever and found to have recurrent mrsa bacteremia and also mri lumbar spine progression of l2-4 vertebral om and ventral epidural phlegmon 10/13 bcx mrsa (R tetra; sensitive to bactrim) 10/16 bcx ngtd She was admitted to [facility] and placed on 8 weeks vancomycin with dialysis until planned 11/15/22 She has moderate back pain still but no fever  chill. Same as 09/2022 No malaise/f/c No diarrhea   04/28/23 id clinic f/u She is not sure if she is taking bactrim (as we advised from 03/2023 visit about indefinite therapy) I don't have her nursing home paperwork She has pain in lower back not worse from before No n/v/diarrhea No f/c     Allergies  Allergen Reactions   Shrimp (Diagnostic) Other (See Comments)    Unknown reaction   Other Swelling and Rash    Anesthesia - facial swelling, rash      Outpatient Medications Prior to Visit  Medication Sig Dispense Refill   acetaminophen (TYLENOL) 325 MG tablet Take 2 tablets (650 mg total) by mouth 4 (four) times daily. (Patient taking differently: Take 650 mg by mouth every 12 (twelve) hours as needed for moderate pain.)     albuterol (VENTOLIN HFA) 108 (90 Base) MCG/ACT inhaler Inhale 2 puffs into the lungs every 4 (four) hours as needed for wheezing or shortness of breath. 6.7 g 0   amiodarone (PACERONE) 200 MG tablet Take 1 tablet (200 mg total) by mouth daily.  apixaban (ELIQUIS) 2.5 MG TABS tablet Take 1 tablet (2.5 mg total) by mouth 2 (two) times daily. 60 tablet 0   cloNIDine (CATAPRES) 0.1 MG tablet Take 0.1 mg by mouth every 6 (six) hours as needed (SBP > 170).     fluticasone-salmeterol (ADVAIR DISKUS) 250-50 MCG/ACT AEPB Inhale 1 puff into the lungs in the morning and at bedtime. 14 each 0   gabapentin (NEURONTIN) 100 MG capsule Take 1 capsule (100 mg total) by mouth at bedtime. (Patient not taking: Reported on 03/17/2023) 30 capsule 3   gabapentin (NEURONTIN) 300 MG capsule Take 300 mg by mouth 3 (three) times daily.     Methoxy PEG-Epoetin Beta (MIRCERA IJ) Mircera     metoprolol tartrate (LOPRESSOR) 50 MG tablet Take 1 tablet (50 mg total) by mouth 2 (two) times daily.     oxyCODONE-acetaminophen (PERCOCET/ROXICET) 5-325 MG tablet Take 1 tablet by mouth every 6 (six) hours as needed for up to 6 doses for severe pain. 6 tablet 0   pantoprazole (PROTONIX) 40 MG tablet  Take 1 tablet (40 mg total) by mouth daily. (Patient taking differently: Take 40 mg by mouth 2 (two) times daily.) 30 tablet 0   sevelamer (RENAGEL) 800 MG tablet Take 2 tablets by mouth 3 times a day every Tuesday Thursday and Saturday Give 2 tablets by mouth with meals every Monday Wednesday Friday and Sunday (Patient taking differently: Take 800 mg by mouth 3 (three) times daily with meals.) 60 tablet 0   sulfamethoxazole-trimethoprim (BACTRIM DS) 800-160 MG tablet Take 1 tablet by mouth 2 (two) times daily. 60 tablet 5   tizanidine (ZANAFLEX) 2 MG capsule Take 2 mg by mouth 3 (three) times daily.     No facility-administered medications prior to visit.     Social History   Socioeconomic History   Marital status: Widowed    Spouse name: Not on file   Number of children: Not on file   Years of education: Not on file   Highest education level: Not on file  Occupational History   Not on file  Tobacco Use   Smoking status: Never    Passive exposure: Never   Smokeless tobacco: Never  Vaping Use   Vaping Use: Never used  Substance and Sexual Activity   Alcohol use: No   Drug use: No   Sexual activity: Not Currently    Birth control/protection: Post-menopausal  Other Topics Concern   Not on file  Social History Narrative   Not on file   Social Determinants of Health   Financial Resource Strain: Not on file  Food Insecurity: No Food Insecurity (11/30/2022)   Hunger Vital Sign    Worried About Running Out of Food in the Last Year: Never true    Ran Out of Food in the Last Year: Never true  Transportation Needs: No Transportation Needs (11/30/2022)   PRAPARE - Administrator, Civil Service (Medical): No    Lack of Transportation (Non-Medical): No  Physical Activity: Not on file  Stress: Not on file  Social Connections: Moderately Integrated (07/20/2022)   Social Connection and Isolation Panel [NHANES]    Frequency of Communication with Friends and Family: Three  times a week    Frequency of Social Gatherings with Friends and Family: Three times a week    Attends Religious Services: 1 to 4 times per year    Active Member of Clubs or Organizations: Yes    Attends Banker Meetings: 1 to 4 times  per year    Marital Status: Widowed  Intimate Partner Violence: Not At Risk (11/30/2022)   Humiliation, Afraid, Rape, and Kick questionnaire    Fear of Current or Ex-Partner: No    Emotionally Abused: No    Physically Abused: No    Sexually Abused: No      Review of Systems    All other ros negative  Objective:    There were no vitals taken for this visit. Nursing note and vital signs reviewed.  Physical Exam     General/constitutional: no distress, pleasant; in wheel chair HEENT: Normocephalic, PER, Conj Clear, EOMI, Oropharynx clear Neck supple CV: rrr no mrg Lungs: clear to auscultation, normal respiratory effort Abd: Soft, Nontender Ext: no edema Skin: No Rash Neuro: nonfocal - in wheel chair; bilateral LE 4/5 strength MSK: no peripheral joint swelling/tenderness/warmth; back spines nontender         Labs: Lab Results  Component Value Date   WBC 6.0 02/04/2023   HGB 9.6 (L) 02/04/2023   HCT 28.9 (L) 02/04/2023   MCV 97.3 02/04/2023   PLT 230 02/04/2023   Last metabolic panel Lab Results  Component Value Date   GLUCOSE 100 (H) 02/04/2023   NA 136 02/04/2023   K 5.0 02/04/2023   CL 94 (L) 02/04/2023   CO2 27 02/04/2023   BUN 43 (H) 02/04/2023   CREATININE 7.16 (H) 02/04/2023   GFRNONAA 6 (L) 02/04/2023   CALCIUM 9.5 02/04/2023   PHOS 5.2 (H) 02/04/2023   PROT 7.0 01/15/2023   ALBUMIN 2.3 (L) 01/26/2023   LABGLOB 3.6 10/17/2019   AGRATIO 0.6 (L) 10/17/2019   BILITOT 1.0 01/15/2023   ALKPHOS 52 01/15/2023   AST 31 01/15/2023   ALT 6 01/15/2023   ANIONGAP 15 02/04/2023       Component Value Date/Time   CRP 41.1 (H) 03/17/2023 0214   CRP 33.8 (H) 01/15/2023 1218   CRP 12.0 (H) 12/02/2022 0540     Micro:  Serology:  Imaging: I personally reviewed and incorporated into decision making  10/18 tte  1. Left ventricular ejection fraction, by estimation, is 60 to 65%. The  left ventricle has normal function. The left ventricle has no regional  wall motion abnormalities. There is mild concentric left ventricular  hypertrophy. Left ventricular diastolic  function could not be evaluated.   2. Right ventricular systolic function is normal. The right ventricular  size is normal. There is normal pulmonary artery systolic pressure.   3. Left atrial size was moderately dilated.   4. Previously noted mobile mass in the RA not visible on this study.  Right atrial size was mildly dilated.   5. The mitral valve is abnormal. Mild mitral valve regurgitation. No  evidence of mitral stenosis.   6. Tricuspid valve regurgitation is mild to moderate.   7. The aortic valve is tricuspid. There is mild calcification of the  aortic valve. Aortic valve regurgitation is trivial. Aortic valve  sclerosis/calcification is present, without any evidence of aortic  stenosis. Aortic valve area, by VTI measures 2.96  cm. Aortic valve mean gradient measures 3.0 mmHg. Aortic valve Vmax  measures 1.24 m/s.   8. Aortic dilatation noted. There is borderline dilatation of the  ascending aorta, measuring 38 mm.   9. The inferior vena cava is dilated in size with <50% respiratory  variability, suggesting right atrial pressure of 15 mmHg.    01/23/23 mri lumbar spine 1. Transitional L5 vertebra as previously numbered. 2. L2-3 discitis/osteomyelitis with purulence in  a chronic L3 body fracture. Progression of left psoas involvement at this level to an abscess appearance measuring up to 2.5 cm. There may be facet arthritis on the right at this level. Posterior epidural phlegmon/collection combines with degeneration to cause high-grade thecal sac narrowing at this level. 3. L4-5 collapse and endplate destruction  from more chronic discitis/osteomyelitis. 4. L3-4 and L4-5 advanced degenerative spinal stenosis.  10/13 mri lumbar spine 1. Findings are concerning for progression of discitis/osteomyelitis with increased soft tissue extent of infection in the paraspinal and epidural spaces. Compared to prior exam from 07/26/2022, there is increased abnormal soft tissue in the ventral epidural space at the L2-L3 and L3-L4 disc space levels resulting in moderate to severe spinal canal narrowing at these levels. There is also increased signal abnormality in the surrounding paraspinal soft tissues and bilateral psoas musculature. 2. Persistent severe spinal canal narrowing at the L5-S1 level secondary to a central disc protrusion. 3. Stable compression deformities of the L2, L3, L4, and L5 vertebral bodies with irregular endplate erosion at the L4-L5 level, best seen on CT of the L-spine obtained on 09/16/2022.  12/26 mri lumbar spine 1. Unchanged findings of discitis-osteomyelitis at L2-L3 and L4-L5 with worsening paravertebral inflammatory changes. No paravertebral or epidural abscess. 2. Unchanged mild-to-moderate L2-L3, moderate to severe L3-L4, and moderate L4-L5 spinal canal stenosis.     Assessment & Plan:   Problem List Items Addressed This Visit   None   72 y.o. female esrd on iHD, recurrent mrsa bacteremia, recent finished 4 weeks iv cefazolin 07/13/22 for mrsa bacteremia then readmitted 2 weeks later 8/22 for acute on chronic back pain with mri concerning for l2-3 paravertebral edema and still elevated crp, s/p 8 weeks vancomycin by 11/15/22 then with recurrent mrsa bacteremia, here for f/u  09/15/22 Will see what crp is doing Doesn't appear her infection is active at this time Exam showed no tenderness and the pain suspected to be neuropathic  Will repeat lab again and evaluation in 4 weeks Advise complex pain clinic management  If crp continues to go up will consider mri  Will  start gabapentin  Advise she talks to pcp about  pain clinic referral   -------- 10/15/22 assessment Recurrent bacteremia mrsa with lumbar spine om; no frank epidural abscess Back pain stable Getting vanc with iHD until 11/15/2022 Will reassess then to see if she still needs oral abx tail  Trial of lidocaine patch -- rx Increase gabapentin gradually to 300 mg qhs -- renew rx  See me around 2nd week of December  ------------ 01/05/23 id clinic f/u Recent fever 12/25 negative sepsis w/u. Crp stable high at low teens. Mri lumbar spine 12/26 no new abscess or discitis Finished 8 weeks 11/15/22 but given bactrim during that admission She is 4-5 months into abx course now  Will stop today as clinically improved   Will see her again in 4-5 weeks off antibiotics and check crp and clinicaly exam to ascertain remission. See me sooner if sign of infection    02/17/23 id clinic assessment 4th episodes mrsa bsi with relapse psoas abscess and lumbar om Back on vanc since 01/15/23 (about 2weeks after finish several months abx for same with improving mri and sx)  Will continue 8 weeks vanc until 4/14 then transition to at least  months bactrim  F/u around 4/14 to get on oral abx  03/17/23 id assessment Will transition to bactrim ds 1 tab po bid today  Plan at least 3 months and if  tolerate I might push indefinitely given several relapse in the past year  F/u 6 weeks repeat crp and see how she does    04/28/23 id assessment Clinically stable no sign of sepsis or worsening back pain Called nursing home and verified that she takes 1 tablet bid  Will continue bactrim ds 1 t  Crp today     Follow-up: Return in about 3 months (around 07/29/2023).      Raymondo Band, MD [facility] 04/28/2023, 3:05 PM

## 2023-04-28 NOTE — Patient Instructions (Signed)
Continue bactrim tablet 1 tab twice a day  See me in 3 months  Labs today

## 2023-04-29 LAB — C-REACTIVE PROTEIN: CRP: 3.8 mg/L (ref <8.0)

## 2023-05-03 NOTE — Progress Notes (Unsigned)
Cardiology Office Note    Date:  05/05/2023   ID:  Diamond, [name] 04/30/1951, MRN [id number]  PCP:  [facility]  Cardiologist:  Meriam Sprague, MD  Electrophysiologist:  None   Chief Complaint: f/u afib  History of Present Illness:   Diamond [name] is a 72 y.o. female with history of ESRD on HD, MRSA bacteremia with IE with recurrent episodes of bacteremia, paroxysmal atrial fib/flutter, HTN, anemia of chronic disease, asthma, hypothyroidism, gout, chronic hepatitis C, lung nodule (stable by f/u CT 06/2022 for 12-18 month f/u), gallbladder mass benign s/p cholecystectomy 06/2022, RBBB seen for follow-up.   [name] was initially seen by our team in 12/2021 during admission for severe sepsis with MRSA bacteremia, L5-S1 discitis and right atrial/IVC mass. She underwent TEE which showed 2.1x1.4cm multilobulated mobile mass attached to the RA/IVC junction 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 vs vegtation at prior HD catheter site. She was managed with antibiotics and apixaban. During admission she developed atrial flutter. Apixaban had to be temporarily held due to bleeding at her IJ site with hypotension requiring transfusion. ID helped to coordinate her abx, reported to have received 8 weeks of IV vancomycin. She was able to be discharged on Eliquis. At office f/u 01/2022 she was back in NSR. She has had multiple readmissions since that time for bacteremia +/- discitis with multiple medical issues and encephalopathy requiring requiring additional abx and eventual Bactrim suppression, frequently complicated by Afib RVR. Subsequent TEE 06/2022 and echoes have not demonstrated any recurrent endocarditis. During 09/2022 admission she was trialed on IV amiodarone for her AFib with conversion to NSR but developed QT prolongation to so this was discontinued. However, on hand calculation of EKG 10/20, QTc corrects to . Our team evaluated her last in 01/2023 when she was admitted  for osteomyelitis discitis and MRSA bacteremia complicated by AF RVR. She was treated with amiodarone and converted back to NSR. 2D echo 01/17/23 EF 60-65%, G2DD, mildly elevated PASP, no valvular vegetations. She was a poor historian and encephalopathic. Her family did not want to pursue TEE. As she was felt to require lifelong abx it was unclear how a TEE would really change management. She is being followed by ID as outpatient for abx management.  She is seen for follow-up today with her sister. She reports she is doing very well without any CP, SOB, palpitations. She is totally alert and oriented. She arrives in a wheelchair. Initial BP 170/90, recheck by me coming down to 155/82. They do think she periodically has low BP at HD but are not sure. She does report they've noted it running high as well. We do not yet have an accurate med list at this time. She lives at [facility] and did not come with any paperwork. Our team will be reaching out to them to clarify her medicine list. Denies any bleeding.   Labwork independently reviewed: 02/2023 Mg 2.2, K 5.0, Cr 7.16, Hgb 8.6, plt 353 11/2022 albumin 2.9, ALT AST OK 09/2022 TSH OK No recent lipids followed by [place]  Past History   Past Medical History:  Diagnosis Date   Anemia    Asthma    Atrial flutter with rapid ventricular response (HCC) 12/25/2021   Bacteremia    ESRD (end stage renal disease) (HCC)    dialysis Tues Thurs Sat   Gout    Hepatitis C    Hypertension    PAF (paroxysmal atrial fibrillation) (HCC)  RBBB     Past Surgical History:  Procedure Laterality Date   AV FISTULA PLACEMENT Left 12/23/2021   Procedure: LEFT ARM BRACHIOBASILIC VEIN ARTERIOVENOUS (AV) FISTULA CREATION;  Surgeon: Leonie Douglas, MD;  Location: [facility] OR;  Service: Vascular;  Laterality: Left;   BASCILIC VEIN TRANSPOSITION Left 03/08/2022   Procedure: LEFT SECOND STAGE BASILIC VEIN TRANSPOSITION;  Surgeon: Leonie Douglas, MD;  Location: [facility] OR;   Service: Vascular;  Laterality: Left;   BIOPSY  01/19/2022   Procedure: BIOPSY;  Surgeon: Shellia Cleverly, DO;  Location: [facility] ENDOSCOPY;  Service: Gastroenterology;;   CESAREAN SECTION  1989   CHOLECYSTECTOMY N/A 06/25/2022   Procedure: OPEN CHOLECYSTECTOMY;  Surgeon: Fritzi Mandes, MD;  Location: [facility] OR;  Service: General;  Laterality: N/A;   COLONOSCOPY Left 01/19/2022   Procedure: COLONOSCOPY;  Surgeon: Shellia Cleverly, DO;  Location: [facility] ENDOSCOPY;  Service: Gastroenterology;  Laterality: Left;   ESOPHAGOGASTRODUODENOSCOPY Left 01/19/2022   Procedure: ESOPHAGOGASTRODUODENOSCOPY (EGD);  Surgeon: Shellia Cleverly, DO;  Location: [facility] ENDOSCOPY;  Service: Gastroenterology;  Laterality: Left;   INSERTION OF DIALYSIS CATHETER Right 12/23/2021   Procedure: INSERTION OF DIALYSIS CATHETER;  Surgeon: Leonie Douglas, MD;  Location: [facility] OR;  Service: Vascular;  Laterality: Right;   LAPAROSCOPY N/A 06/25/2022   Procedure: STAGING LAPAROSCOPY;  Surgeon: Fritzi Mandes, MD;  Location: [facility] OR;  Service: General;  Laterality: N/A;   OPEN PARTIAL HEPATECTOMY  N/A 06/25/2022   Procedure: OPEN PARTIAL HEPATECTOMY;  Surgeon: Fritzi Mandes, MD;  Location: [facility] OR;  Service: General;  Laterality: N/A;   POLYPECTOMY  01/19/2022   Procedure: POLYPECTOMY;  Surgeon: Shellia Cleverly, DO;  Location: [facility] ENDOSCOPY;  Service: Gastroenterology;;   TEE WITHOUT CARDIOVERSION N/A 12/22/2021   Procedure: TRANSESOPHAGEAL ECHOCARDIOGRAM (TEE);  Surgeon: Jake Bathe, MD;  Location: [facility] ENDOSCOPY;  Service: Cardiovascular;  Laterality: N/A;   TEE WITHOUT CARDIOVERSION N/A 06/17/2022   Procedure: TRANSESOPHAGEAL ECHOCARDIOGRAM (TEE);  Surgeon: Jodelle Red, MD;  Location: [facility] ENDOSCOPY;  Service: Cardiovascular;  Laterality: N/A;   ULTRASOUND GUIDANCE FOR VASCULAR ACCESS  12/23/2021   Procedure: ULTRASOUND GUIDANCE FOR VASCULAR ACCESS;  Surgeon: Leonie Douglas, MD;  Location: [facility] OR;  Service: Vascular;;    Current  Medications: Current Meds  Medication Sig   acetaminophen (TYLENOL) 325 MG tablet Take 2 tablets (650 mg total) by mouth 4 (four) times daily. (Patient taking differently: Take 650 mg by mouth every 12 (twelve) hours as needed for moderate pain.)   albuterol (VENTOLIN HFA) 108 (90 Base) MCG/ACT inhaler Inhale 2 puffs into the lungs every 4 (four) hours as needed for wheezing or shortness of breath.   amiodarone (PACERONE) 200 MG tablet Take 1 tablet (200 mg total) by mouth daily.   apixaban (ELIQUIS) 2.5 MG TABS tablet Take 1 tablet (2.5 mg total) by mouth 2 (two) times daily.   cloNIDine (CATAPRES) 0.1 MG tablet Take 0.1 mg by mouth every 6 (six) hours as needed (SBP > 170).   fluticasone-salmeterol (ADVAIR DISKUS) 250-50 MCG/ACT AEPB Inhale 1 puff into the lungs in the morning and at bedtime.   gabapentin (NEURONTIN) 100 MG capsule Take 1 capsule (100 mg total) by mouth at bedtime.   gabapentin (NEURONTIN) 300 MG capsule Take 300 mg by mouth 3 (three) times daily.   Methoxy PEG-Epoetin Beta (MIRCERA IJ) Mircera   metoprolol tartrate (LOPRESSOR) 50 MG tablet Take 1 tablet (50 mg total) by mouth 2 (two) times daily.   oxyCODONE-acetaminophen (PERCOCET/ROXICET) 5-325 MG tablet  Take 1 tablet by mouth every 6 (six) hours as needed for up to 6 doses for severe pain.   pantoprazole (PROTONIX) 40 MG tablet Take 1 tablet (40 mg total) by mouth daily. (Patient taking differently: Take 40 mg by mouth 2 (two) times daily.)   sevelamer (RENAGEL) 800 MG tablet Take 2 tablets by mouth 3 times a day every Tuesday Thursday and Saturday Give 2 tablets by mouth with meals every Monday Wednesday Friday and Sunday (Patient taking differently: Take 800 mg by mouth 3 (three) times daily with meals.)   sulfamethoxazole-trimethoprim (BACTRIM DS) 800-160 MG tablet Take 1 tablet by mouth 2 (two) times daily.   tizanidine (ZANAFLEX) 2 MG capsule Take 2 mg by mouth 3 (three) times daily.      Allergies:   Shrimp  (diagnostic) and Other   Social History   Socioeconomic History   Marital status: Widowed    Spouse name: Not on file   Number of children: Not on file   Years of education: Not on file   Highest education level: Not on file  Occupational History   Not on file  Tobacco Use   Smoking status: Never    Passive exposure: Never   Smokeless tobacco: Never  Vaping Use   Vaping Use: Never used  Substance and Sexual Activity   Alcohol use: No   Drug use: No   Sexual activity: Not Currently    Birth control/protection: Post-menopausal  Other Topics Concern   Not on file  Social History Narrative   Not on file   Social Determinants of Health   Financial Resource Strain: Not on file  Food Insecurity: No Food Insecurity (11/30/2022)   Hunger Vital Sign    Worried About Running Out of Food in the Last Year: Never true    Ran Out of Food in the Last Year: Never true  Transportation Needs: No Transportation Needs (11/30/2022)   PRAPARE - Administrator, Civil Service (Medical): No    Lack of Transportation (Non-Medical): No  Physical Activity: Not on file  Stress: Not on file  Social Connections: Moderately Integrated (07/20/2022)   Social Connection and Isolation Panel [NHANES]    Frequency of Communication with Friends and Family: Three times a week    Frequency of Social Gatherings with Friends and Family: Three times a week    Attends Religious Services: 1 to 4 times per year    Active Member of Clubs or Organizations: Yes    Attends Banker Meetings: 1 to 4 times per year    Marital Status: Widowed     Family History:  The patient's family history includes Colon cancer in her brother; Hypertension in her father and mother; Lung cancer in her brother.  ROS:   Please see the history of present illness.  All other systems are reviewed and otherwise negative.    EKG(s)/Additional Testing   EKG:  EKG is ordered today, personally reviewed,  demonstrating NSR 63bpm, RBBB, QTc  CV Studies: Cardiac studies reviewed are outlined and summarized above. Otherwise please see EMR for full report.  Recent Labs: 06/12/2022: B Natriuretic Peptide 814.2 09/23/2022: TSH 3.268 01/15/2023: ALT 6 02/04/2023: BUN 43; Creatinine, Ser 7.16; Hemoglobin 9.6; Magnesium 2.2; Platelets 230; Potassium 5.0; Sodium 136  Recent Lipid Panel    Component Value Date/Time   CHOL 188 02/23/2011 0431   TRIG 122 02/23/2011 0431   HDL 84 02/23/2011 0431   CHOLHDL 2.2 Ratio 02/23/2011 0431  VLDL 24 02/23/2011 0431   LDLCALC 80 02/23/2011 0431    PHYSICAL EXAM:    VS:  BP (!) 170/90   Pulse 63   Ht 5\' 5"  (1.651 m)   Wt 115 lb (52.2 kg) Comment: last wt in Mar, 2024  SpO2 97%   BMI 19.14 kg/m   BMI: Body mass index is 19.14 kg/m. Recheck BP 155/82  GEN: Thin elderly female in no acute distress HEENT: normocephalic, atraumatic Neck: no JVD, carotid bruits, or masses Cardiac: RRR; no murmurs, rubs, or gallops, no edema. HD graft L arm Respiratory:  clear to auscultation bilaterally, normal work of breathing GI: soft, nontender, nondistended, + BS MS: no deformity or atrophy Skin: warm and dry, no rash Neuro:  Alert and Oriented x 3, Strength and sensation are intact, follows commands Psych: euthymic mood, full affect  Wt Readings from Last 3 Encounters:  05/05/23 115 lb (52.2 kg)  02/17/23 115 lb (52.2 kg)  01/26/23 115 lb 15.4 oz (52.6 kg)     ASSESSMENT & PLAN:   1. Paroxysmal atrial fib/flutter - maintaining NSR, intended to be on amiodarone 200mg  daily, Lopressor 50mg  BID, and Eliquis 2.5mg  BID (dose adjusted for HD, weight <60kg). We will reach out to SNF to review formal med list. Would continue present regimen. Update H/H, LFTs, TSH today.  2. H/o endocarditis - abx followed by ID. Continue ongoing f/u.  3. RBBB - stable, no evidence of bradycardia.  4. Essential HTN in setting of ESRD - initial BP 170/90, recheck 155/82. She  reports intermittent low BP at HD possibly though is not sure. She does report they've noted it running high as well.  I have recommended the nursing facility monitor at home and call nephrologist if SBP running >140 regularly to help guide medication changes. This is best served through the nephrology team since they're kept apprised of the day-to-date HD readings as well (goes MWF). Please let cardiology know if we can be of any assistance.  HYPERTENSION CONTROL Vitals:   05/05/23 0941 05/05/23 1012  BP: (!) 170/90 (!) 155/82    The patient's blood pressure is elevated above target today. In order to address the patient's elevated BP: -- (See comments above)        Disposition: Dr. Shari Prows is relocating therefore will establish with Dr. Izora Ribas in 4 months.   Medication Adjustments/Labs and Tests Ordered: Current medicines are reviewed at length with the patient today.  Concerns regarding medicines are outlined above. Medication changes, Labs and Tests ordered today are summarized above and listed in the Patient Instructions accessible in Encounters.   Signed, Laurann Montana, PA-C  05/05/2023 10:00 AM    [facility] Phone: [phone]; Fax: [phone]

## 2023-05-04 ENCOUNTER — Ambulatory Visit: Payer: Medicare (Managed Care) | Admitting: Physician Assistant

## 2023-05-05 ENCOUNTER — Ambulatory Visit: Payer: Medicare (Managed Care) | Attending: Cardiology | Admitting: Physician Assistant

## 2023-05-05 ENCOUNTER — Encounter: Payer: Self-pay | Admitting: Physician Assistant

## 2023-05-05 ENCOUNTER — Telehealth: Payer: Self-pay | Admitting: Physician Assistant

## 2023-05-05 VITALS — BP 155/82 | HR 63 | Ht 65.0 in | Wt 115.0 lb

## 2023-05-05 DIAGNOSIS — I4892 Unspecified atrial flutter: Secondary | ICD-10-CM | POA: Diagnosis not present

## 2023-05-05 DIAGNOSIS — I4510 Unspecified right bundle-branch block: Secondary | ICD-10-CM | POA: Diagnosis not present

## 2023-05-05 DIAGNOSIS — Z8679 Personal history of other diseases of the circulatory system: Secondary | ICD-10-CM

## 2023-05-05 DIAGNOSIS — I480 Paroxysmal atrial fibrillation: Secondary | ICD-10-CM | POA: Diagnosis not present

## 2023-05-05 DIAGNOSIS — I10 Essential (primary) hypertension: Secondary | ICD-10-CM

## 2023-05-05 NOTE — Patient Instructions (Signed)
Medication Instructions:  Your physician recommends that you continue on your current medications as directed. Please refer to the Current Medication list given to you today.  *If you need a refill on your cardiac medications before your next appointment, please call your pharmacy*  Lab Work: TODAY: LFTs, TSH, Hemoglobin and Hematocrit If you have labs (blood work) drawn today and your tests are completely normal, you will receive your results only by: MyChart Message (if you have MyChart) OR A paper copy in the mail If you have any lab test that is abnormal or we need to change your treatment, we will call you to review the results.  Testing/Procedures: None ordered today.  Follow-Up: At [facility], you and your health needs are our priority.  As part of our continuing mission to provide you with exceptional heart care, we have created designated Provider Care Teams.  These Care Teams include your primary Cardiologist (physician) and Advanced Practice Providers (APPs -  Physician Assistants and Nurse Practitioners) who all work together to provide you with the care you need, when you need it.  Your next appointment:   4 month(s)  The format for your next appointment:   In Person  Provider:   Riley Lam, MD  Other Instructions Note to [facility] Staff: continue checking blood pressure per facility protocol, call nephrologist if systolic BP is >140.

## 2023-05-05 NOTE — Telephone Encounter (Signed)
Med reviewed. No change in plan. On appropriate cardiac therapy at this time. Continue plan as outlined in note.

## 2023-05-05 NOTE — Telephone Encounter (Signed)
Requested current medication list from Diamond [name], fax received.  Medication list updated. Mircera not on SNF medication list, this may be administered at dialysis.

## 2023-05-06 LAB — HEPATIC FUNCTION PANEL
ALT: 11 IU/L (ref 0–32)
AST: 21 IU/L (ref 0–40)
Albumin: 3.9 g/dL (ref 3.8–4.8)
Alkaline Phosphatase: 68 IU/L (ref 44–121)
Bilirubin Total: 0.3 mg/dL (ref 0.0–1.2)
Bilirubin, Direct: 0.12 mg/dL (ref 0.00–0.40)
Total Protein: 6.8 g/dL (ref 6.0–8.5)

## 2023-05-06 LAB — TSH: TSH: 13.1 u[IU]/mL — ABNORMAL HIGH (ref 0.450–4.500)

## 2023-05-06 LAB — HEMOGLOBIN AND HEMATOCRIT, BLOOD
Hematocrit: 38.1 % (ref 34.0–46.6)
Hemoglobin: 12.1 g/dL (ref 11.1–15.9)

## 2023-05-18 ENCOUNTER — Encounter (HOSPITAL_COMMUNITY): Payer: Self-pay | Admitting: Emergency Medicine

## 2023-05-18 ENCOUNTER — Emergency Department (HOSPITAL_COMMUNITY): Payer: Medicare (Managed Care)

## 2023-05-18 ENCOUNTER — Other Ambulatory Visit: Payer: Self-pay

## 2023-05-18 ENCOUNTER — Emergency Department (HOSPITAL_COMMUNITY)
Admission: EM | Admit: 2023-05-18 | Discharge: 2023-05-19 | Disposition: A | Discharge status: Home / Self Care | Payer: Medicare (Managed Care) | Attending: Emergency Medicine | Admitting: Emergency Medicine

## 2023-05-18 DIAGNOSIS — Z992 Dependence on renal dialysis: Secondary | ICD-10-CM | POA: Diagnosis not present

## 2023-05-18 DIAGNOSIS — I120 Hypertensive chronic kidney disease with stage 5 chronic kidney disease or end stage renal disease: Secondary | ICD-10-CM | POA: Insufficient documentation

## 2023-05-18 DIAGNOSIS — Z7901 Long term (current) use of anticoagulants: Secondary | ICD-10-CM | POA: Diagnosis not present

## 2023-05-18 DIAGNOSIS — N186 End stage renal disease: Secondary | ICD-10-CM | POA: Insufficient documentation

## 2023-05-18 DIAGNOSIS — E871 Hypo-osmolality and hyponatremia: Secondary | ICD-10-CM | POA: Insufficient documentation

## 2023-05-18 DIAGNOSIS — I10 Essential (primary) hypertension: Secondary | ICD-10-CM

## 2023-05-18 DIAGNOSIS — R519 Headache, unspecified: Secondary | ICD-10-CM | POA: Diagnosis present

## 2023-05-18 DIAGNOSIS — J45909 Unspecified asthma, uncomplicated: Secondary | ICD-10-CM | POA: Insufficient documentation

## 2023-05-18 DIAGNOSIS — E875 Hyperkalemia: Secondary | ICD-10-CM | POA: Diagnosis not present

## 2023-05-18 LAB — COMPREHENSIVE METABOLIC PANEL
ALT: 12 U/L (ref 0–44)
AST: 16 U/L (ref 15–41)
Albumin: 3.7 g/dL (ref 3.5–5.0)
Alkaline Phosphatase: 65 U/L (ref 38–126)
Anion gap: 16 — ABNORMAL HIGH (ref 5–15)
BUN: 87 mg/dL — ABNORMAL HIGH (ref 8–23)
CO2: 20 mmol/L — ABNORMAL LOW (ref 22–32)
Calcium: 9.4 mg/dL (ref 8.9–10.3)
Chloride: 92 mmol/L — ABNORMAL LOW (ref 98–111)
Creatinine, Ser: 9.36 mg/dL — ABNORMAL HIGH (ref 0.44–1.00)
GFR, Estimated: 4 mL/min — ABNORMAL LOW (ref >60)
Glucose, Bld: 95 mg/dL (ref 70–99)
Potassium: 6.7 mmol/L (ref 3.5–5.1)
Sodium: 128 mmol/L — ABNORMAL LOW (ref 135–145)
Total Bilirubin: 0.9 mg/dL (ref 0.3–1.2)
Total Protein: 7.4 g/dL (ref 6.5–8.1)

## 2023-05-18 LAB — CBC WITH DIFFERENTIAL/PLATELET
Abs Immature Granulocytes: 0.01 10*3/uL (ref 0.00–0.07)
Basophils Absolute: 0 10*3/uL (ref 0.0–0.1)
Basophils Relative: 0 %
Eosinophils Absolute: 0.1 10*3/uL (ref 0.0–0.5)
Eosinophils Relative: 4 %
HCT: 36.4 % (ref 36.0–46.0)
Hemoglobin: 12.1 g/dL (ref 12.0–15.0)
Immature Granulocytes: 0 %
Lymphocytes Relative: 20 %
Lymphs Abs: 0.6 10*3/uL — ABNORMAL LOW (ref 0.7–4.0)
MCH: 34.1 pg — ABNORMAL HIGH (ref 26.0–34.0)
MCHC: 33.2 g/dL (ref 30.0–36.0)
MCV: 102.5 fL — ABNORMAL HIGH (ref 80.0–100.0)
Monocytes Absolute: 0.3 10*3/uL (ref 0.1–1.0)
Monocytes Relative: 8 %
Neutro Abs: 2.1 10*3/uL (ref 1.7–7.7)
Neutrophils Relative %: 68 %
Platelets: 118 10*3/uL — ABNORMAL LOW (ref 150–400)
RBC: 3.55 MIL/uL — ABNORMAL LOW (ref 3.87–5.11)
RDW: 18.9 % — ABNORMAL HIGH (ref 11.5–15.5)
WBC: 3.1 10*3/uL — ABNORMAL LOW (ref 4.0–10.5)
nRBC: 0 % (ref 0.0–0.2)

## 2023-05-18 LAB — HEPATITIS B SURFACE ANTIGEN: Hepatitis B Surface Ag: NONREACTIVE

## 2023-05-18 LAB — MAGNESIUM: Magnesium: 2.9 mg/dL — ABNORMAL HIGH (ref 1.7–2.4)

## 2023-05-18 MED ORDER — CALCIUM GLUCONATE-NACL 1-0.675 GM/50ML-% IV SOLN
1.0000 g | Freq: Once | INTRAVENOUS | Status: AC
  Administered 2023-05-18: 1000 mg via INTRAVENOUS
  Filled 2023-05-18: qty 50

## 2023-05-18 MED ORDER — ACETAMINOPHEN 500 MG PO TABS
1000.0000 mg | ORAL_TABLET | Freq: Once | ORAL | Status: AC
  Administered 2023-05-18: 1000 mg via ORAL
  Filled 2023-05-18: qty 2

## 2023-05-18 MED ORDER — CHLORHEXIDINE GLUCONATE CLOTH 2 % EX PADS: 6.0000 | MEDICATED_PAD | Freq: Every day | CUTANEOUS | Status: DC

## 2023-05-18 MED ORDER — HYDRALAZINE HCL 20 MG/ML IJ SOLN
10.0000 mg | Freq: Once | INTRAMUSCULAR | Status: AC
  Administered 2023-05-18: 10 mg via INTRAVENOUS
  Filled 2023-05-18: qty 1

## 2023-05-18 MED ORDER — ALBUTEROL SULFATE (2.5 MG/3ML) 0.083% IN NEBU
10.0000 mg | INHALATION_SOLUTION | Freq: Once | RESPIRATORY_TRACT | Status: AC
  Administered 2023-05-18: 10 mg via RESPIRATORY_TRACT
  Filled 2023-05-18: qty 12

## 2023-05-18 MED ORDER — SODIUM BICARBONATE 8.4 % IV SOLN
50.0000 meq | Freq: Once | INTRAVENOUS | Status: AC
  Administered 2023-05-18: 50 meq via INTRAVENOUS
  Filled 2023-05-18: qty 50

## 2023-05-18 MED ORDER — DIPHENHYDRAMINE HCL 50 MG/ML IJ SOLN
12.5000 mg | Freq: Once | INTRAMUSCULAR | Status: AC
  Administered 2023-05-18: 12.5 mg via INTRAVENOUS
  Filled 2023-05-18: qty 1

## 2023-05-18 MED ORDER — METOCLOPRAMIDE HCL 5 MG/ML IJ SOLN
10.0000 mg | Freq: Once | INTRAMUSCULAR | Status: AC
  Administered 2023-05-18: 10 mg via INTRAVENOUS
  Filled 2023-05-18: qty 2

## 2023-05-18 MED ORDER — LABETALOL HCL 5 MG/ML IV SOLN
10.0000 mg | Freq: Once | INTRAVENOUS | Status: AC
  Administered 2023-05-18: 10 mg via INTRAVENOUS
  Filled 2023-05-18: qty 4

## 2023-05-18 NOTE — Progress Notes (Signed)
Asked to see patient for dialysis.  The patient is not being admitted at this time. Pt seen in ED. She is somnolent but arouses easily and is Ox 3. Exam w/o gross edema, lungs sound clear. Planning for HD to start early this evening. Also I wrote for some of the temporizing measures for high K+ (albuterol, IV bicarb and IV Ca++). This will be ED HD. Patient will be sent back to ED for reassessment after HD session is completed.    OP HD:  MWF SW  3.5h  400/600   49kg  2/2 bath  L AVF  Heparin none - last OP HD was 6/07, post wt 50.6kg   - missed HD 6/10 Monday and missed today  Vinson Moselle, MD 05/18/2023, 4:27 PM  Recent Labs  Lab 05/18/23 1508  HGB 12.1  ALBUMIN 3.7  CALCIUM 9.4  CREATININE 9.36*  K 6.7*    Inpatient medications:

## 2023-05-18 NOTE — ED Triage Notes (Signed)
Pt BIB [facility] from [facility] due to to neck and back pain.  Pt normally gets dialysis M, W, F but refused last two treatments.  Pt last full dialysis treatment was last Friday 05/13/23.  Pt states that she fell four days ago and hit head.  Temporarily living at [facility].  VS BP 200/101, HR 60, SpO2 97% RA

## 2023-05-18 NOTE — ED Notes (Signed)
Respiratory called about administering 10mg  of albuterol

## 2023-05-18 NOTE — ED Provider Notes (Signed)
[place] EMERGENCY DEPARTMENT AT [facility] Provider Note   CSN: [id number] Arrival date & time: 05/18/23  1413     History  Chief Complaint  Patient presents with   Back Pain    Neck Pain    Diamond [name] is a 72 y.o. female with ESRD on HD MWF, asthma, HTN, paroxysmal aflutter, gout, h/o vertebral osteomyelitis/discitis, hep C, IDA, malnutrition, h/o duodenitis presents with headache and HTN.   Pt BIB [facility] from [facility] due to  Pt normally gets dialysis M, W, F but refused last two treatments because she didn't want to go. Pt last full dialysis treatment was last Friday 05/13/23. Pt is temporarily living at [facility]. States she fell out of the bed on Sunday night and is having a headache on the right side of her face, which is now causing a R-sided 7/10 intermittent headache. Never had headache like this before. No associated f/c, wasn't maximal at onset. Also endorses nausea, no vomiting, started today. Endorses chronic back pain but right now not hurting at all. Also denies neck pain. Gets her medicines every morning at rehab, hasn't missed any BP meds.  EMS VS BP 200/101, HR 60, SpO2 97% RA    Back Pain      Home Medications Prior to Admission medications   Medication Sig Start Date End Date Taking? Authorizing Provider  acetaminophen (TYLENOL) 500 MG tablet Take 500 mg by mouth every 6 (six) hours as needed for mild pain. Take 2 tablets (1000mg  total) by mouth every 8 hours as needed for back and leg pain.    [provider]  albuterol (VENTOLIN HFA) 108 (90 Base) MCG/ACT inhaler Inhale 2 puffs into the lungs every 4 (four) hours as needed for wheezing or shortness of breath. 10/06/22   Zadie Rhine, MD  amiodarone (PACERONE) 200 MG tablet Take 1 tablet (200 mg total) by mouth daily. 01/27/23   Dorcas Carrow, MD  apixaban (ELIQUIS) 2.5 MG TABS tablet Take 1 tablet (2.5 mg total) by mouth 2 (two) times daily. 10/06/22 12/01/23   Zadie Rhine, MD  cloNIDine (CATAPRES) 0.1 MG tablet Take 0.1 mg by mouth every 6 (six) hours as needed (SBP > 170).    [provider]  Diaper Rash Products (DESITIN MULTI-PURPOSE HEALING) OINT Apply 1 Application topically 3 (three) times daily as needed (with incontinence to prevent skin breakdown).    [provider]  Emollient Juanell Fairly) LOTN Apply 1 Application topically 3 (three) times daily as needed (dry skin).    [provider]  fluticasone-salmeterol (ADVAIR DISKUS) 250-50 MCG/ACT AEPB Inhale 1 puff into the lungs in the morning and at bedtime. 10/06/22   Zadie Rhine, MD  gabapentin (NEURONTIN) 300 MG capsule Take 300 mg by mouth 3 (three) times a week. Take on Mondays, Wednesdays and Fridays after dialysis.    [provider]  lidocaine 4 % Place 1 patch onto the skin daily. Apply for 12 hours to lower back and leave off for 12 hours between patches.    [provider]  Menthol, Topical Analgesic, (BIOFREEZE) 4 % GEL Apply 1 Application topically 3 (three) times daily as needed (lower back and knee pain).    [provider]  Methoxy PEG-Epoetin Beta (MIRCERA IJ) Mircera 10/09/22 10/08/23  [provider]  metoprolol tartrate (LOPRESSOR) 50 MG tablet Take 1 tablet (50 mg total) by mouth 2 (two) times daily. 01/26/23   Dorcas Carrow, MD  pantoprazole (PROTONIX) 40 MG tablet  Take 1 tablet (40 mg total) by mouth daily. Patient taking differently: Take 40 mg by mouth 2 (two) times daily. 10/06/22   Zadie Rhine, MD  sevelamer (RENAGEL) 800 MG tablet Take 800 mg by mouth as directed. Take 2 tablets (1600mg  total) by mouth 3 times a day every Tuesday, Thursday and Saturday. Take 2 tablets (1600mg  total) with meals every Monday, Wednesday, Friday and Sunday.    [provider]  sulfamethoxazole-trimethoprim (BACTRIM DS) 800-160 MG tablet Take 1 tablet by mouth 2 (two) times daily. 03/17/23 09/13/23  Raymondo Band, MD       Allergies    Shrimp (diagnostic) and Other    Review of Systems   Review of Systems  Musculoskeletal:  Positive for back pain.   A 10 point review of systems was performed and is negative unless otherwise reported in HPI.  Physical Exam Updated Vital Signs BP (!) 222/87   Pulse 60   Temp 98.6 F (37 C) (Oral)   Resp 16   Ht 5\' 5"  (1.651 m)   Wt 53 kg   SpO2 98%   BMI 19.44 kg/m  Physical Exam General: Normal appearing female, lying in bed.  HEENT: PERRLA, Sclera anicteric, MMM, trachea midline.  Cardiology: RRR, no murmurs/rubs/gallops. BL radial and DP pulses equal bilaterally.  Resp: Normal respiratory rate and effort. CTAB, no wheezes, rhonchi, crackles.  Abd: Soft, non-tender, non-distended. No rebound tenderness or guarding.  GU: Deferred. MSK: Dialysis fistula with palpable thrill in LUE. No peripheral edema or signs of trauma. Extremities without deformity or TTP. No cyanosis or clubbing. Skin: warm, dry. No rashes or lesions. Back: No CVA tenderness Neuro: A&Ox4, CNs II-XII grossly intact. MAEs. Sensation grossly intact.  Psych: Normal mood and affect.   ED Results / Procedures / Treatments   Labs (all labs ordered are listed, but only abnormal results are displayed) Labs Reviewed  CBC WITH DIFFERENTIAL/PLATELET - Abnormal; Notable for the following components:      Result Value   WBC 3.1 (*)    RBC 3.55 (*)    MCV 102.5 (*)    MCH 34.1 (*)    RDW 18.9 (*)    Platelets 118 (*)    Lymphs Abs 0.6 (*)    All other components within normal limits  COMPREHENSIVE METABOLIC PANEL - Abnormal; Notable for the following components:   Sodium 128 (*)    Potassium 6.7 (*)    Chloride 92 (*)    CO2 20 (*)    BUN 87 (*)    Creatinine, Ser 9.36 (*)    GFR, Estimated 4 (*)    Anion gap 16 (*)    All other components within normal limits  MAGNESIUM - Abnormal; Notable for the following components:   Magnesium 2.9 (*)    All other components within normal limits     EKG EKG Interpretation  Date/Time:  Wednesday May 18 2023 14:19:49 EDT Ventricular Rate:  60 PR Interval:    QRS Duration: 158 QT Interval:  467 QTC Calculation: 467 R Axis:   -32 Text Interpretation: Atrial fibrillation Right bundle branch block Similar to prior Confirmed by Vivi Barrack [phone]) on 05/18/2023 4:07:50 PM  Radiology No results found.  Procedures Procedures    Medications Ordered in ED Medications  hydrALAZINE (APRESOLINE) injection 10 mg (has no administration in time range)  labetalol (NORMODYNE) injection 10 mg (10 mg Intravenous Given 05/18/23 1520)  acetaminophen (TYLENOL) tablet 1,000 mg (1,000 mg Oral Given 05/18/23 1520)  diphenhydrAMINE (  BENADRYL) injection 12.5 mg (12.5 mg Intravenous Given 05/18/23 1524)  metoCLOPramide (REGLAN) injection 10 mg (10 mg Intravenous Given 05/18/23 1522)    ED Course/ Medical Decision Making/ A&P                          Medical Decision Making   This patient presents to the ED for concern of HA, fall, hypertension, this involves an extensive number of treatment options, and is a complaint that carries with it a high risk of complications and morbidity.  I considered the following differential and admission for this acute, potentially life threatening condition.   MDM:    Patient with headache and severe hypertension in the setting of not going to dialysis for the last 2 episodes, last dialysis was last week.  Did also fall and hit her head several days ago, denies any neck or back pain however will obtain CT head to rule out ICH as a sequela of the fall.  Patient likely will need dialysis today.  She is very hypertensive and did take her BP meds this morning orally, will give IV labetalol to attempt a control blood pressure now.  Consider hypertensive emergency of her though she has no focal neurodeficits or chest pain at this time.  Does not go to her dialysis and labs demonstrate hyperkalemia 6.7 and hyponatremia 128.   No EKG changes. Blood glucose is 95 mg/dL. Will consult to nephrology to see how quickly she can get to dialysis.  Clinical Course as of 05/18/23 1616  Wed May 18, 2023  1512 BP now 208/82, will give labetalol IV for BP control. No CP or SOB, just headache currently. [HN]  1605 Potassium(!!): 6.7 +Hyperkalemia likely from lack of dialysis [HN]  1606 Creatinine(!): 9.36 Known ESRD [HN]  1607 Magnesium(!): 2.9 Mild elevation [HN]  1607 Sodium(!): 128 +hyponatremia w/ lack of dialysis [HN]  1614 Consulted to nephrology.  [HN]    Clinical Course User Index [HN] Loetta Rough, MD    Labs: I Ordered, and personally interpreted labs.  The pertinent results include: Those listed above  Imaging Studies ordered: I ordered imaging studies including CTH I independently visualized and interpreted imaging. I agree with the radiologist interpretation  Additional history obtained from chart review.    Cardiac Monitoring: The patient was maintained on a cardiac monitor.  I personally viewed and interpreted the cardiac monitored which showed an underlying rhythm of: A-fib  Reevaluation: After the interventions noted above, I reevaluated the patient and found that they have :improved  Social Determinants of Health: Patient lives independently   Disposition:  My interpretation of CTH is NAICP, but awaiting official read. Patient is signed out to the oncoming ED physician Dr. Wilkie Aye who is made aware of her history, presentation, exam, workup, and plan. Plan is to further control BP w/ hydralazine IV as the labetalol did not help much and talk with nephrology to see how soon they can dialyze her. Will need to treat hyperkalemia if cannot get to dialysis right away.    Co morbidities that complicate the patient evaluation  Past Medical History:  Diagnosis Date   Anemia    Asthma    Atrial flutter with rapid ventricular response (HCC) 12/25/2021   Bacteremia    ESRD (end stage renal  disease) (HCC)    dialysis Tues Thurs Sat   Gout    Hepatitis C    Hypertension    PAF (paroxysmal atrial fibrillation) (HCC)  RBBB      Medicines Meds ordered this encounter  Medications   labetalol (NORMODYNE) injection 10 mg   acetaminophen (TYLENOL) tablet 1,000 mg   diphenhydrAMINE (BENADRYL) injection 12.5 mg   metoCLOPramide (REGLAN) injection 10 mg   hydrALAZINE (APRESOLINE) injection 10 mg    I have reviewed the patients home medicines and have made adjustments as needed  Problem List / ED Course: Problem List Items Addressed This Visit       Cardiovascular and Mediastinum   Hypertension - Primary   Relevant Medications   hydrALAZINE (APRESOLINE) injection 10 mg     Other   Hyponatremia   Hyperkalemia   Other Visit Diagnoses     Acute nonintractable headache, unspecified headache type       Relevant Medications   labetalol (NORMODYNE) injection 10 mg (Completed)   acetaminophen (TYLENOL) tablet 1,000 mg (Completed)                   This note was created using dictation software, which may contain spelling or grammatical errors.    Loetta Rough, MD 05/18/23 [id number]

## 2023-05-18 NOTE — ED Notes (Signed)
Pt at dialysis

## 2023-05-19 LAB — HEPATITIS B SURFACE ANTIBODY, QUANTITATIVE: Hep B S AB Quant (Post): 10.1 m[IU]/mL (ref >9.9)

## 2023-05-19 NOTE — Progress Notes (Signed)
   05/18/23 2335  Vitals  Temp 97.7 F (36.5 C)  BP 110/81  MAP (mmHg) 99  BP Location Right Arm  BP Method Automatic  Patient Position (if appropriate) Lying  Pulse Rate 69  Pulse Rate Source Monitor  ECG Heart Rate 68  Resp 12  Oxygen Therapy  SpO2 100 %  O2 Device Room Air  During Treatment Monitoring  Intra-Hemodialysis Comments Tx completed  Post Treatment  Dialyzer Clearance Lightly streaked  Duration of HD Treatment -hour(s) 3.45 hour(s)  Hemodialysis Intake (mL) 100 mL  Liters Processed 81  Fluid Removed (mL) 1900 mL  Tolerated HD Treatment Yes  Post-Hemodialysis Comments Due to drop in blood pressures only could [id number]  AVG/AVF Arterial Site Held (minutes) 10 minutes  AVG/AVF Venous Site Held (minutes) 10 minutes  Note  Observations pt stable   Post treatment Pt caox4 no complaints.

## 2023-05-19 NOTE — Progress Notes (Signed)
Late Note Entry  Contacted [facility] to advise clinic that pt received HD late last night and was d/c from ED back to snf.   Olivia Canter Renal Navigator [phone]

## 2023-05-19 NOTE — ED Notes (Signed)
[facility] unable to take wheelchair, they stated they will inform the facility their inability to take the wheelchair on the ambulance. [facility] will have to come and get it later on. Patient labels applied, and a note, and put in the dirty utility room. Charge also made aware.

## 2023-05-19 NOTE — Progress Notes (Signed)
Received patient in bed to unit.  Alert and oriented. X 4 Informed consent signed and in chart. yes  TX duration: 3 hours 45 minutes  Patient tolerated well. Caution with fluid removal due to low blood pressures Transported back to the room E/D Alert, without acute distress. No distress Hand-off given to patient's nurse. Christean Leaf RN  Access used: left upper arm fistula Access issues: no complications  Total UF removed: 1900 ml Medication(s) given: 0 Post HD VS: 128/83 68 Post HD weight: 51.9kg   [name]

## 2023-05-19 NOTE — ED Provider Notes (Signed)
Patient signed out to me by previous provider. Please refer to their note for full HPI.  Briefly this is a 72 year old female who presents to the emergency department with headache and hypertension.  Patient has missed her last 2 dialysis sessions due to noncompliance.  Was hypertensive on arrival with headache.  Workup in regards to this was reassuring, please refer to previous note.  HTN has improved.  Workup was notable for hyperkalemia.  This was treated prior to dialysis.  Patient tolerated of dialysis session.  Upon returning to the ER and on reevaluation she is well-appearing, no respiratory distress.  She has no active complaints at this time.  Vitals are normal.  Will plan for outpatient follow-up.  Patient at this time appears safe and stable for discharge and close outpatient follow up. Discharge plan and strict return to ED precautions discussed, patient verbalizes understanding and agreement.   Rozelle Logan, DO 05/19/23 0034

## 2023-05-19 NOTE — ED Notes (Signed)
Talked to [place] at [facility]. Informed them patient will be heading back.

## 2023-05-25 ENCOUNTER — Emergency Department (HOSPITAL_COMMUNITY): Payer: Medicare (Managed Care)

## 2023-05-25 ENCOUNTER — Emergency Department (HOSPITAL_COMMUNITY)
Admission: EM | Admit: 2023-05-25 | Discharge: 2023-05-26 | Disposition: A | Discharge status: Home / Self Care | Payer: Medicare (Managed Care) | Attending: Emergency Medicine | Admitting: Emergency Medicine

## 2023-05-25 DIAGNOSIS — Z7901 Long term (current) use of anticoagulants: Secondary | ICD-10-CM | POA: Diagnosis not present

## 2023-05-25 DIAGNOSIS — R0602 Shortness of breath: Secondary | ICD-10-CM | POA: Insufficient documentation

## 2023-05-25 LAB — BASIC METABOLIC PANEL
Anion gap: 18 — ABNORMAL HIGH (ref 5–15)
BUN: 24 mg/dL — ABNORMAL HIGH (ref 8–23)
CO2: 27 mmol/L (ref 22–32)
Calcium: 9.6 mg/dL (ref 8.9–10.3)
Chloride: 92 mmol/L — ABNORMAL LOW (ref 98–111)
Creatinine, Ser: 4.24 mg/dL — ABNORMAL HIGH (ref 0.44–1.00)
GFR, Estimated: 11 mL/min — ABNORMAL LOW (ref >60)
Glucose, Bld: 82 mg/dL (ref 70–99)
Potassium: 3.6 mmol/L (ref 3.5–5.1)
Sodium: 137 mmol/L (ref 135–145)

## 2023-05-25 LAB — CBC WITH DIFFERENTIAL/PLATELET
Abs Immature Granulocytes: 0.01 10*3/uL (ref 0.00–0.07)
Basophils Absolute: 0 10*3/uL (ref 0.0–0.1)
Basophils Relative: 0 %
Eosinophils Absolute: 0.1 10*3/uL (ref 0.0–0.5)
Eosinophils Relative: 2 %
HCT: 28.6 % — ABNORMAL LOW (ref 36.0–46.0)
Hemoglobin: 9.9 g/dL — ABNORMAL LOW (ref 12.0–15.0)
Immature Granulocytes: 0 %
Lymphocytes Relative: 30 %
Lymphs Abs: 1.1 10*3/uL (ref 0.7–4.0)
MCH: 35.2 pg — ABNORMAL HIGH (ref 26.0–34.0)
MCHC: 34.6 g/dL (ref 30.0–36.0)
MCV: 101.8 fL — ABNORMAL HIGH (ref 80.0–100.0)
Monocytes Absolute: 0.4 10*3/uL (ref 0.1–1.0)
Monocytes Relative: 12 %
Neutro Abs: 1.9 10*3/uL (ref 1.7–7.7)
Neutrophils Relative %: 56 %
Platelets: 141 10*3/uL — ABNORMAL LOW (ref 150–400)
RBC: 2.81 MIL/uL — ABNORMAL LOW (ref 3.87–5.11)
RDW: 18.1 % — ABNORMAL HIGH (ref 11.5–15.5)
WBC: 3.5 10*3/uL — ABNORMAL LOW (ref 4.0–10.5)
nRBC: 0 % (ref 0.0–0.2)

## 2023-05-25 LAB — TROPONIN I (HIGH SENSITIVITY): Troponin I (High Sensitivity): 20 ng/L — ABNORMAL HIGH (ref <18)

## 2023-05-25 LAB — BRAIN NATRIURETIC PEPTIDE: B Natriuretic Peptide: 289.7 pg/mL — ABNORMAL HIGH (ref 0.0–100.0)

## 2023-05-25 MED ORDER — DEXAMETHASONE 4 MG PO TABS
10.0000 mg | ORAL_TABLET | Freq: Once | ORAL | Status: AC
  Administered 2023-05-25: 10 mg via ORAL
  Filled 2023-05-25: qty 3

## 2023-05-25 NOTE — ED Triage Notes (Signed)
Pt BIB [facility] from [facility] for shortness of breath. EMS reports clear lung sounds, facility sent her out because she reported SOB and it was not relieved with her inhaler. RR 18, 97% RA, Dialysis patient, received full treatment today.

## 2023-05-25 NOTE — Discharge Instructions (Signed)
Please return for worsening difficulty breathing.  Your hemoglobin is a little bit lower than last check though appears to be at your baseline.  Please return for dark stool or blood in your stool.

## 2023-05-25 NOTE — ED Provider Notes (Signed)
[place] EMERGENCY DEPARTMENT AT [facility] Provider Note   CSN: [id number] Arrival date & time: 05/25/23  1955     History  Chief Complaint  Patient presents with   Shortness of Breath    Diamond [name] is a 72 y.o. female.  72 yo F with a chief complaint of difficulty breathing.  She said this been going on for a few days.  She has been trying her albuterol inhaler but without improvement.  She gets dialysis Monday Wednesday and Friday and got dialysis today and had a full session.  She denies any cough or congestion denies fevers denies chest pain denies lower extremity edema.  Her nursing facility was concerned because she was not getting better with albuterol and she was brought here for evaluation.  Further history with EMS had no obvious difficulty breathing clear lung sounds no tachypnea.   Shortness of Breath      Home Medications Prior to Admission medications   Medication Sig Start Date End Date Taking? Authorizing Provider  acetaminophen (TYLENOL) 500 MG tablet Take 1,000 mg by mouth every 8 (eight) hours as needed (for back and leg pain). Take 2 tablets (1000mg  total) by mouth every 8 hours as needed for back and leg pain.    [provider]  albuterol (VENTOLIN HFA) 108 (90 Base) MCG/ACT inhaler Inhale 2 puffs into the lungs every 4 (four) hours as needed for wheezing or shortness of breath. 10/06/22   Zadie Rhine, MD  amiodarone (PACERONE) 200 MG tablet Take 1 tablet (200 mg total) by mouth daily. 01/27/23   Dorcas Carrow, MD  apixaban (ELIQUIS) 2.5 MG TABS tablet Take 1 tablet (2.5 mg total) by mouth 2 (two) times daily. 10/06/22 12/01/23  Zadie Rhine, MD  cloNIDine (CATAPRES) 0.1 MG tablet Take 0.1 mg by mouth every 6 (six) hours as needed (SBP > 170).    [provider]  Diaper Rash Products (DESITIN MULTI-PURPOSE HEALING) OINT Apply 1 Application topically 3 (three) times daily as needed (with incontinence to prevent skin  breakdown).    [provider]  Emollient Juanell Fairly) LOTN Apply 1 Application topically 3 (three) times daily as needed (dry skin).    [provider]  fluticasone-salmeterol (ADVAIR DISKUS) 250-50 MCG/ACT AEPB Inhale 1 puff into the lungs in the morning and at bedtime. 10/06/22   Zadie Rhine, MD  gabapentin (NEURONTIN) 300 MG capsule Take 300 mg by mouth 3 (three) times a week. Take on Mondays, Wednesdays and Fridays after dialysis.    [provider]  hydrALAZINE (APRESOLINE) 50 MG tablet Take 50 mg by mouth 3 (three) times daily. Take on non H.D days; Tuesday, Thursday, saturday    [provider]  HYDROcodone-acetaminophen (NORCO) 7.5-325 MG tablet Take 1 tablet by mouth 2 (two) times daily.    [provider]  lidocaine 4 % Place 1 patch onto the skin daily. Apply for 12 hours to lower back and leave off for 12 hours between patches.    [provider]  Menthol, Topical Analgesic, (BIOFREEZE) 4 % GEL Apply 1 Application topically 3 (three) times daily as needed (lower back and knee pain).    [provider]  Methoxy PEG-Epoetin Beta (MIRCERA IJ) Mircera 10/09/22 10/08/23  [provider]  metoprolol tartrate (LOPRESSOR) 50 MG tablet Take 1 tablet (50 mg total) by mouth 2 (two) times daily. 01/26/23   Dorcas Carrow, MD  pantoprazole (PROTONIX) 40 MG tablet Take 1 tablet (40 mg total) by mouth daily.  10/06/22   Zadie Rhine, MD  sevelamer (RENAGEL) 800 MG tablet Take 800 mg by mouth as directed. Take 2 tablets (1600mg  total) by mouth 3 times a day every Tuesday, Thursday and Saturday. Take 2 tablets (1600mg  total) with meals every Monday, Wednesday, Friday and Sunday.    [provider]  sulfamethoxazole-trimethoprim (BACTRIM DS) 800-160 MG tablet Take 1 tablet by mouth 2 (two) times daily. Patient taking differently: Take 1 tablet by mouth 2 (two) times daily. For MRSA infection 03/17/23 09/13/23  Vu, Tonita Phoenix, MD       Allergies    Shellfish-derived products, Shrimp (diagnostic), and Other    Review of Systems   Review of Systems  Respiratory:  Positive for shortness of breath.     Physical Exam Updated Vital Signs BP 116/76 (BP Location: Right Arm)   Pulse 75   Temp 98.3 F (36.8 C) (Oral)   Resp 16   SpO2 99%  Physical Exam Vitals and nursing note reviewed.  Constitutional:      General: She is not in acute distress.    Appearance: She is well-developed. She is not diaphoretic.  HENT:     Head: Normocephalic and atraumatic.  Eyes:     Pupils: Pupils are equal, round, and reactive to light.  Cardiovascular:     Rate and Rhythm: Normal rate and regular rhythm.     Heart sounds: No murmur heard.    No friction rub. No gallop.  Pulmonary:     Effort: Pulmonary effort is normal.     Breath sounds: No wheezing or rales.  Abdominal:     General: There is no distension.     Palpations: Abdomen is soft.     Tenderness: There is no abdominal tenderness.  Musculoskeletal:        General: No tenderness.     Cervical back: Normal range of motion and neck supple.  Skin:    General: Skin is warm and dry.  Neurological:     Mental Status: She is alert and oriented to person, place, and time.  Psychiatric:        Behavior: Behavior normal.     ED Results / Procedures / Treatments   Labs (all labs ordered are listed, but only abnormal results are displayed) Labs Reviewed  CBC WITH DIFFERENTIAL/PLATELET - Abnormal; Notable for the following components:      Result Value   WBC 3.5 (*)    RBC 2.81 (*)    Hemoglobin 9.9 (*)    HCT 28.6 (*)    MCV 101.8 (*)    MCH 35.2 (*)    RDW 18.1 (*)    Platelets 141 (*)    All other components within normal limits  BASIC METABOLIC PANEL - Abnormal; Notable for the following components:   Chloride 92 (*)    BUN 24 (*)    Creatinine, Ser 4.24 (*)    GFR, Estimated 11 (*)    Anion gap 18 (*)    All other components within normal limits  BRAIN  NATRIURETIC PEPTIDE - Abnormal; Notable for the following components:   B Natriuretic Peptide 289.7 (*)    All other components within normal limits  TROPONIN I (HIGH SENSITIVITY) - Abnormal; Notable for the following components:   Troponin I (High Sensitivity) 20 (*)    All other components within normal limits    EKG EKG Interpretation  Date/Time:  Wednesday May 25 2023 21:44:24 EDT Ventricular Rate:  71 PR Interval:  153 QRS  Duration: 140 QT Interval:  494 QTC Calculation: 537 R Axis:   33 Text Interpretation: Sinus rhythm Consider left atrial enlargement Right bundle branch block similar to 02/04/23 Confirmed by Melene Plan [phone]) on 05/25/2023 10:23:54 PM  Radiology DG Chest Port 1 View  Result Date: 05/25/2023 CLINICAL DATA:  Shortness of breath EXAM: PORTABLE CHEST 1 VIEW COMPARISON:  Previous chest radiograph done on 02/10 14 and CT chest done on 06/15/2022 FINDINGS: Transverse diameter of heart is slightly increased. Thoracic aorta is tortuous and ectatic. Lung fields are clear of any infiltrates or pulmonary edema. There is no pleural effusion or pneumothorax. There is coarse calcification in the lateral aspect of left lower lung field. In the previous CT, similar soft tissue calcification was seen in the left chest wall. IMPRESSION: No active cardiopulmonary disease. Electronically Signed   By: Ernie Avena M.D.   On: 05/25/2023 20:45    Procedures Procedures    Medications Ordered in ED Medications  dexamethasone (DECADRON) tablet 10 mg (has no administration in time range)    ED Course/ Medical Decision Making/ A&P                             Medical Decision Making Amount and/or Complexity of Data Reviewed Labs: ordered. Radiology: ordered. ECG/medicine tests: ordered.  Risk Prescription drug management.   72 yo F with a chief complaint of difficulty breathing.  This been going on for the past 2 to 3 days.  I reviewed the patient's medical record and  she unfortunately has suffered with endocarditis with multiple septic emboli and is chronically on antibiotics post.  She does have clear lung sounds for me.  There is no tachypnea.  No obvious difficulty breathing on external exam.  Will obtain a chest x-ray blood work reassess.  Chest x-ray independently interpreted by me without focal infiltrate or pneumothorax.  Patient's hemoglobin is at her baseline but it is bit lower than last check.  Patient denies any dark stool or blood in her stool.  No significant electrolyte abnormality.  Troponin at her baseline, BNP less than normal.  She is reassessed and continues to feel well.  No difficulty breathing currently.  Not sure what exactly occurred.  As the patient had used her albuterol inhaler before perhaps she had had some bronchospasm.  Will give a dose of Decadron here.  PCP follow-up.  11:05 PM:  I have discussed the diagnosis/risks/treatment options with the patient.  Evaluation and diagnostic testing in the emergency department does not suggest an emergent condition requiring admission or immediate intervention beyond what has been performed at this time.  They will follow up with PCP. We also discussed returning to the ED immediately if new or worsening sx occur. We discussed the sx which are most concerning (e.g., sudden worsening sob, fever, inability to tolerate by mouth) that necessitate immediate return. Medications administered to the patient during their visit and any new prescriptions provided to the patient are listed below.  Medications given during this visit Medications  dexamethasone (DECADRON) tablet 10 mg (has no administration in time range)     The patient appears reasonably screen and/or stabilized for discharge and I doubt any other medical condition or other [facility] requiring further screening, evaluation, or treatment in the ED at this time prior to discharge.          Final Clinical Impression(s) / ED Diagnoses Final  diagnoses:  Shortness of breath  Rx / DC Orders ED Discharge Orders     None         Melene Plan, DO 05/25/23 2305

## 2023-07-09 IMAGING — DX DG ANKLE COMPLETE 3+V*L*
3 series · 3 of 3 positions shown · non-contrast
Comparison: None Available.

CLINICAL DATA: Fall, left ankle pain

EXAM:
LEFT ANKLE COMPLETE - 3+ VIEW

[ankle ap]
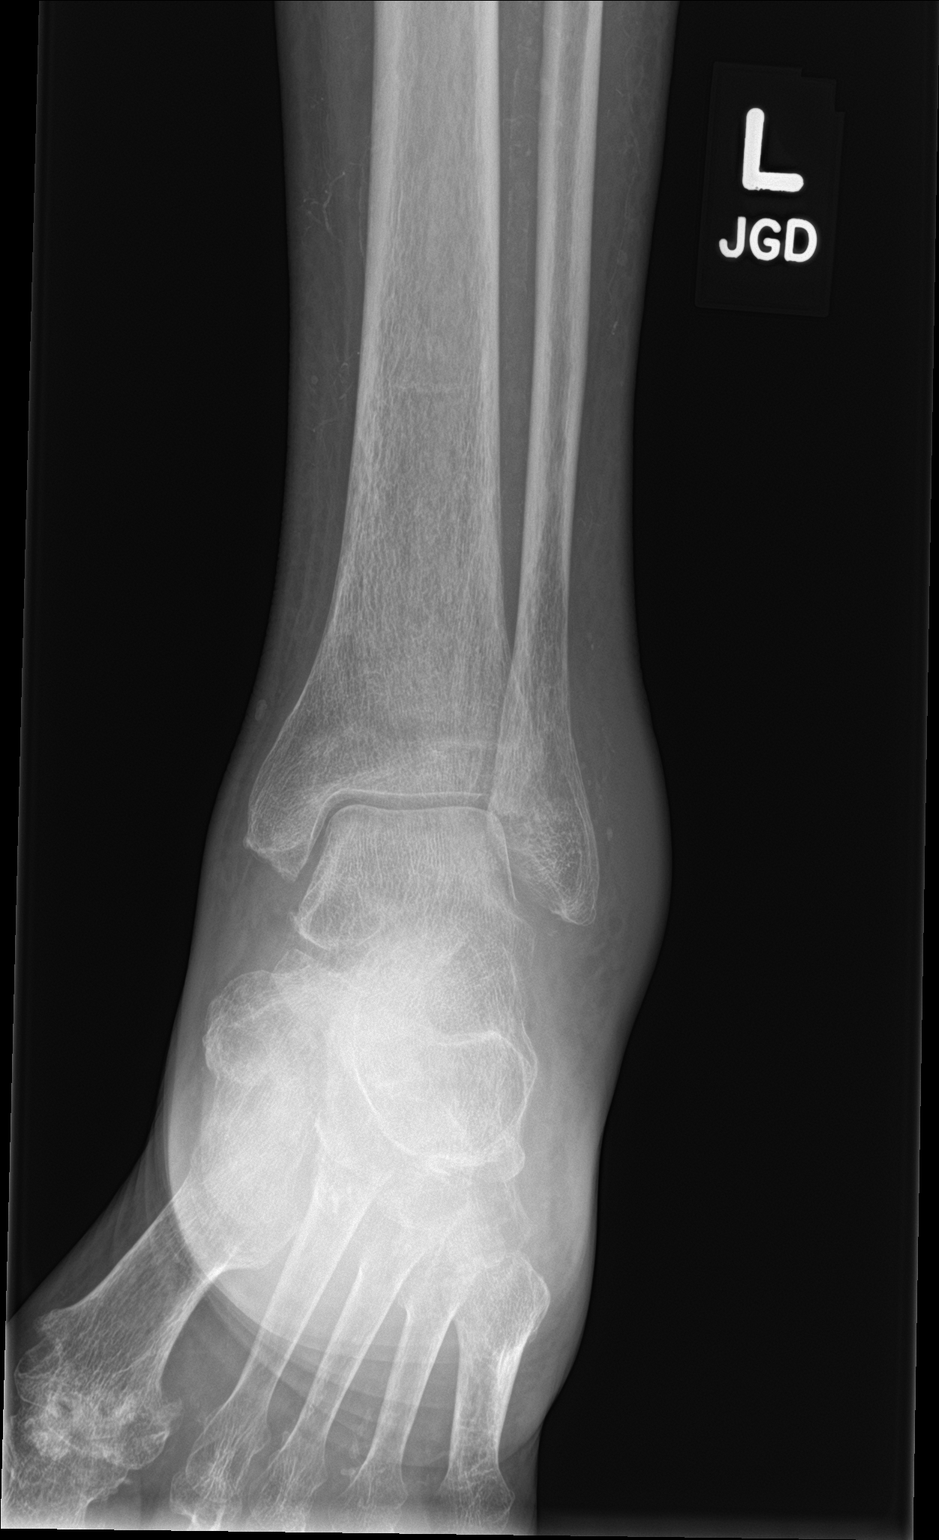

[ankle obl]
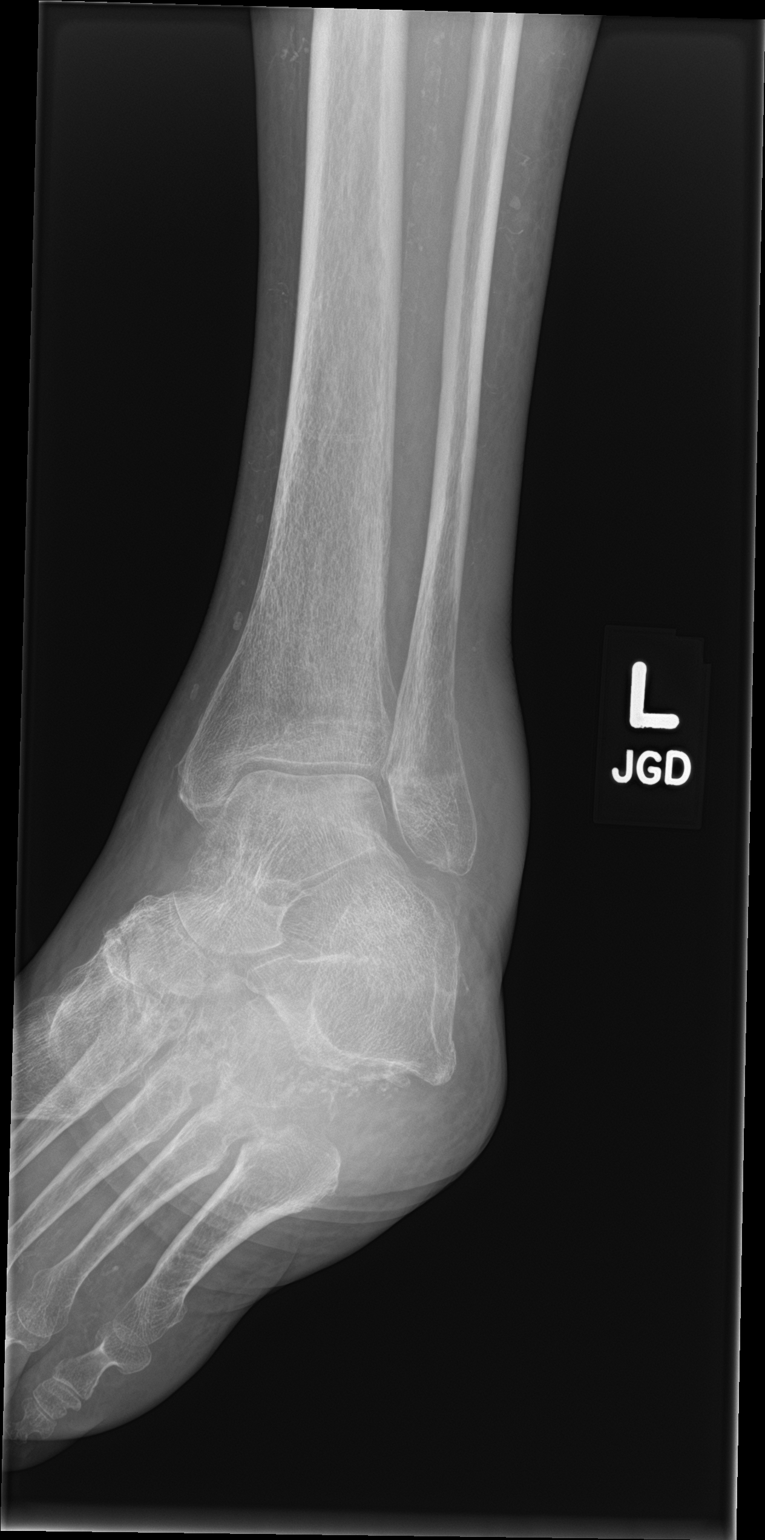

[ankle lat]
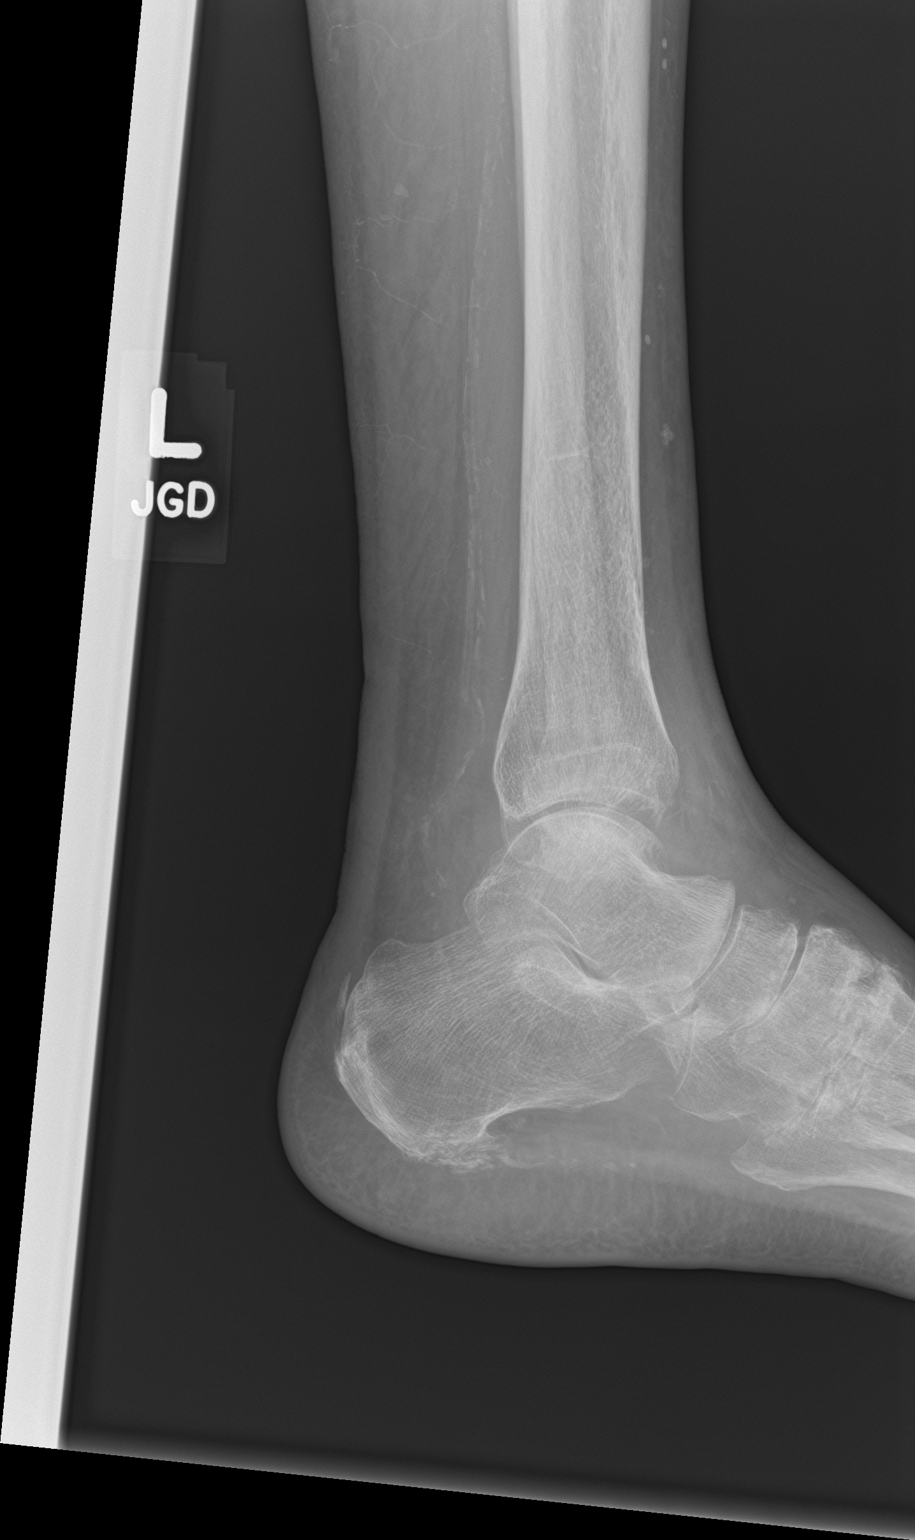

[3 of 3 positions shown; findings below may reference images not displayed]

FINDINGS: No definite fracture is seen.

Moderate soft tissue swelling overlying the lateral malleolus.

The ankle mortise is intact.

The base of the fifth metatarsal is unremarkable.
IMPRESSION: Moderate lateral soft tissue swelling.

No definite fracture is seen.

## 2023-07-14 ENCOUNTER — Ambulatory Visit: Payer: Medicare (Managed Care) | Admitting: Internal Medicine

## 2023-08-17 ENCOUNTER — Other Ambulatory Visit: Payer: Self-pay

## 2023-08-17 ENCOUNTER — Emergency Department (HOSPITAL_COMMUNITY)
Admission: EM | Admit: 2023-08-17 | Discharge: 2023-08-18 | Disposition: A | Discharge status: Home / Self Care | Payer: Medicare (Managed Care) | Attending: Emergency Medicine | Admitting: Emergency Medicine

## 2023-08-17 ENCOUNTER — Emergency Department (HOSPITAL_COMMUNITY): Payer: Medicare (Managed Care)

## 2023-08-17 ENCOUNTER — Ambulatory Visit (HOSPITAL_COMMUNITY): Payer: Medicare (Managed Care)

## 2023-08-17 ENCOUNTER — Encounter (HOSPITAL_COMMUNITY): Payer: Self-pay

## 2023-08-17 DIAGNOSIS — Z7901 Long term (current) use of anticoagulants: Secondary | ICD-10-CM | POA: Diagnosis not present

## 2023-08-17 DIAGNOSIS — Z20822 Contact with and (suspected) exposure to covid-19: Secondary | ICD-10-CM | POA: Insufficient documentation

## 2023-08-17 DIAGNOSIS — J189 Pneumonia, unspecified organism: Secondary | ICD-10-CM

## 2023-08-17 DIAGNOSIS — Z992 Dependence on renal dialysis: Secondary | ICD-10-CM | POA: Insufficient documentation

## 2023-08-17 DIAGNOSIS — Z79899 Other long term (current) drug therapy: Secondary | ICD-10-CM | POA: Diagnosis not present

## 2023-08-17 DIAGNOSIS — J168 Pneumonia due to other specified infectious organisms: Secondary | ICD-10-CM | POA: Insufficient documentation

## 2023-08-17 DIAGNOSIS — N186 End stage renal disease: Secondary | ICD-10-CM | POA: Diagnosis not present

## 2023-08-17 DIAGNOSIS — I120 Hypertensive chronic kidney disease with stage 5 chronic kidney disease or end stage renal disease: Secondary | ICD-10-CM | POA: Diagnosis not present

## 2023-08-17 DIAGNOSIS — R059 Cough, unspecified: Secondary | ICD-10-CM | POA: Diagnosis present

## 2023-08-17 LAB — BASIC METABOLIC PANEL
Anion gap: 18 — ABNORMAL HIGH (ref 5–15)
BUN: 53 mg/dL — ABNORMAL HIGH (ref 8–23)
CO2: 26 mmol/L (ref 22–32)
Calcium: 10.5 mg/dL — ABNORMAL HIGH (ref 8.9–10.3)
Chloride: 93 mmol/L — ABNORMAL LOW (ref 98–111)
Creatinine, Ser: 7.87 mg/dL — ABNORMAL HIGH (ref 0.44–1.00)
GFR, Estimated: 5 mL/min — ABNORMAL LOW (ref >60)
Glucose, Bld: 90 mg/dL (ref 70–99)
Potassium: 4.9 mmol/L (ref 3.5–5.1)
Sodium: 137 mmol/L (ref 135–145)

## 2023-08-17 LAB — CBC
HCT: 32.4 % — ABNORMAL LOW (ref 36.0–46.0)
Hemoglobin: 10.7 g/dL — ABNORMAL LOW (ref 12.0–15.0)
MCH: 35.8 pg — ABNORMAL HIGH (ref 26.0–34.0)
MCHC: 33 g/dL (ref 30.0–36.0)
MCV: 108.4 fL — ABNORMAL HIGH (ref 80.0–100.0)
Platelets: 131 10*3/uL — ABNORMAL LOW (ref 150–400)
RBC: 2.99 MIL/uL — ABNORMAL LOW (ref 3.87–5.11)
RDW: 14.8 % (ref 11.5–15.5)
WBC: 4.9 10*3/uL (ref 4.0–10.5)
nRBC: 0 % (ref 0.0–0.2)

## 2023-08-17 LAB — SARS CORONAVIRUS 2 BY RT PCR: SARS Coronavirus 2 by RT PCR: NEGATIVE

## 2023-08-17 LAB — I-STAT CG4 LACTIC ACID, ED: Lactic Acid, Venous: 1.1 mmol/L (ref 0.5–1.9)

## 2023-08-17 MED ORDER — SODIUM CHLORIDE 0.9 % IV SOLN
2.0000 g | Freq: Once | INTRAVENOUS | Status: AC
  Administered 2023-08-17: 2 g via INTRAVENOUS
  Filled 2023-08-17: qty 20

## 2023-08-17 MED ORDER — ACETAMINOPHEN 325 MG PO TABS
650.0000 mg | ORAL_TABLET | ORAL | Status: AC
  Administered 2023-08-17: 650 mg via ORAL
  Filled 2023-08-17: qty 2

## 2023-08-17 MED ORDER — DOXYCYCLINE HYCLATE 100 MG PO CAPS: 100.0000 mg | ORAL_CAPSULE | Freq: Two times a day (BID) | ORAL | 0 refills | Status: AC

## 2023-08-17 MED ORDER — DOXYCYCLINE HYCLATE 100 MG PO TABS
100.0000 mg | ORAL_TABLET | Freq: Once | ORAL | Status: AC
  Administered 2023-08-17: 100 mg via ORAL
  Filled 2023-08-17: qty 1

## 2023-08-17 MED ORDER — AMOXICILLIN-POT CLAVULANATE 875-125 MG PO TABS: 1.0000 | ORAL_TABLET | Freq: Two times a day (BID) | ORAL | 0 refills | Status: DC

## 2023-08-17 NOTE — ED Triage Notes (Addendum)
EMS stated, I picked her up at the [facility] for dialysis . She complains of weakness, cough, SOB, and just feeling bad. Pt never started dialysis. Pt. Stated, Lavenia Atlas been feeling bad since yesterday. My chest hurts. Pt. Did not take any medication

## 2023-08-17 NOTE — ED Notes (Signed)
Report called to [place], Charity fundraiser at [facility]. Pt made aware of return to facility and verbalized understanding of DC.

## 2023-08-17 NOTE — ED Notes (Signed)
ANTIBIOTICS RESTARTED THROUGH NEW IV. NO SIGNS OF INFILTRATION.

## 2023-08-17 NOTE — ED Notes (Signed)
Phlebotomy at bedside obtained 2nd set of cultures

## 2023-08-17 NOTE — ED Notes (Signed)
Iv was infiltrated.  Antibiotics stopped.

## 2023-08-17 NOTE — Discharge Instructions (Addendum)
You were seen for your pneumonia in the emergency department.   At home, please take the antibiotics (Augmentin and doxycycline).    Check your MyChart online for the results of any tests that had not resulted by the time you left the emergency department.   Follow-up with your primary doctor in 2-3 days regarding your visit.    Return immediately to the emergency department if you experience any of the following: Difficulty breathing, or any other concerning symptoms.    Thank you for visiting our Emergency Department. It was a pleasure taking care of you today.

## 2023-08-17 NOTE — ED Provider Notes (Signed)
[place] EMERGENCY DEPARTMENT AT [facility] Provider Note   CSN: [id number] Arrival date & time: 08/17/23  0734     History  Chief Complaint  Patient presents with   Weakness   Shortness of Breath   Asthma   Cough   Altered Mental Status    Diamond [name] is a 72 y.o. female.  72 year old female with a history of ESRD on Monday Wednesday Friday dialysis, hypertension, atrial flutter on eliquis, and hypertension who presents to the emergency department with cough.  Since yesterday has been having a productive cough with green sputum.  Has also been feeling generally ill since yesterday.  No nausea, vomiting, or diarrhea.  Has not yet taken any medications.  Stays at [place] [facility].  Has been compliant with her dialysis but did not have a session today.  Says that several of the residents have been sick.       Home Medications Prior to Admission medications   Medication Sig Start Date End Date Taking? Authorizing Provider  amoxicillin-clavulanate (AUGMENTIN) 875-125 MG tablet Take 1 tablet by mouth every 12 (twelve) hours. 08/17/23  Yes Rondel Baton, MD  doxycycline (VIBRAMYCIN) 100 MG capsule Take 1 capsule (100 mg total) by mouth 2 (two) times daily for 7 days. 08/17/23 08/24/23 Yes Rondel Baton, MD  acetaminophen (TYLENOL) 500 MG tablet Take 1,000 mg by mouth every 8 (eight) hours as needed (for back and leg pain). Take 2 tablets (1000mg  total) by mouth every 8 hours as needed for back and leg pain.    [provider]  albuterol (VENTOLIN HFA) 108 (90 Base) MCG/ACT inhaler Inhale 2 puffs into the lungs every 4 (four) hours as needed for wheezing or shortness of breath. 10/06/22   Zadie Rhine, MD  amiodarone (PACERONE) 200 MG tablet Take 1 tablet (200 mg total) by mouth daily. 01/27/23   Dorcas Carrow, MD  apixaban (ELIQUIS) 2.5 MG TABS tablet Take 1 tablet (2.5 mg total) by mouth 2 (two) times daily. 10/06/22 12/01/23  Zadie Rhine, MD  cloNIDine  (CATAPRES) 0.1 MG tablet Take 0.1 mg by mouth every 6 (six) hours as needed (SBP > 170).    [provider]  Diaper Rash Products (DESITIN MULTI-PURPOSE HEALING) OINT Apply 1 Application topically 3 (three) times daily as needed (with incontinence to prevent skin breakdown).    [provider]  Emollient Juanell Fairly) LOTN Apply 1 Application topically 3 (three) times daily as needed (dry skin).    [provider]  fluticasone-salmeterol (ADVAIR DISKUS) 250-50 MCG/ACT AEPB Inhale 1 puff into the lungs in the morning and at bedtime. 10/06/22   Zadie Rhine, MD  gabapentin (NEURONTIN) 300 MG capsule Take 300 mg by mouth 3 (three) times a week. Take on Mondays, Wednesdays and Fridays after dialysis.    [provider]  hydrALAZINE (APRESOLINE) 50 MG tablet Take 50 mg by mouth 3 (three) times daily. Take on non H.D days; Tuesday, Thursday, saturday    [provider]  HYDROcodone-acetaminophen (NORCO) 7.5-325 MG tablet Take 1 tablet by mouth 2 (two) times daily.    [provider]  lidocaine 4 % Place 1 patch onto the skin daily. Apply for 12 hours to lower back and leave off for 12 hours between patches.    [provider]  Menthol, Topical Analgesic, (BIOFREEZE) 4 % GEL Apply 1 Application topically 3 (three) times daily as needed (lower back and knee pain).    [provider]  Methoxy PEG-Epoetin Beta (  MIRCERA IJ) Mircera 10/09/22 10/08/23  [provider]  metoprolol tartrate (LOPRESSOR) 50 MG tablet Take 1 tablet (50 mg total) by mouth 2 (two) times daily. 01/26/23   Dorcas Carrow, MD  pantoprazole (PROTONIX) 40 MG tablet Take 1 tablet (40 mg total) by mouth daily. 10/06/22   Zadie Rhine, MD  sevelamer (RENAGEL) 800 MG tablet Take 800 mg by mouth as directed. Take 2 tablets (1600mg  total) by mouth 3 times a day every Tuesday, Thursday and Saturday. Take 2 tablets (1600mg  total) with meals every Monday, Wednesday, Friday  and Sunday.    [provider]  sulfamethoxazole-trimethoprim (BACTRIM DS) 800-160 MG tablet Take 1 tablet by mouth 2 (two) times daily. Patient taking differently: Take 1 tablet by mouth 2 (two) times daily. For MRSA infection 03/17/23 09/13/23  Vu, Tonita Phoenix, MD      Allergies    Shellfish-derived products, Shrimp (diagnostic), and Other    Review of Systems   Review of Systems  Physical Exam Updated Vital Signs BP (!) 172/87 (BP Location: Right Arm)   Pulse 73   Temp 99.9 F (37.7 C) (Oral)   Resp 16   Ht 5\' 5"  (1.651 m)   Wt 59.3 kg   SpO2 97%   BMI 21.76 kg/m  Physical Exam Vitals and nursing note reviewed.  Constitutional:      General: She is not in acute distress.    Appearance: She is well-developed.     Comments: Alert and oriented x 3  HENT:     Head: Normocephalic and atraumatic.     Right Ear: External ear normal.     Left Ear: External ear normal.     Nose: Nose normal.  Eyes:     Extraocular Movements: Extraocular movements intact.     Conjunctiva/sclera: Conjunctivae normal.     Pupils: Pupils are equal, round, and reactive to light.  Cardiovascular:     Rate and Rhythm: Normal rate.     Heart sounds: No murmur heard. Pulmonary:     Effort: Pulmonary effort is normal. No respiratory distress.     Breath sounds: Rhonchi present.  Abdominal:     General: Abdomen is flat. There is no distension.     Palpations: Abdomen is soft. There is no mass.     Tenderness: There is no abdominal tenderness. There is no guarding.  Musculoskeletal:     Cervical back: Normal range of motion and neck supple.     Right lower leg: No edema.     Left lower leg: No edema.     Comments: Fistula in left upper extremity bruit and thrill  Skin:    General: Skin is warm and dry.  Neurological:     Mental Status: She is alert and oriented to person, place, and time. Mental status is at baseline.  Psychiatric:        Mood and Affect: Mood normal.     ED Results /  Procedures / Treatments   Labs (all labs ordered are listed, but only abnormal results are displayed) Labs Reviewed  BASIC METABOLIC PANEL - Abnormal; Notable for the following components:      Result Value   Chloride 93 (*)    BUN 53 (*)    Creatinine, Ser 7.87 (*)    Calcium 10.5 (*)    GFR, Estimated 5 (*)    Anion gap 18 (*)    All other components within normal limits  CBC - Abnormal; Notable for the following components:  RBC 2.99 (*)    Hemoglobin 10.7 (*)    HCT 32.4 (*)    MCV 108.4 (*)    MCH 35.8 (*)    Platelets 131 (*)    All other components within normal limits  SARS CORONAVIRUS 2 BY RT PCR  CULTURE, BLOOD (ROUTINE X 2)  CULTURE, BLOOD (ROUTINE X 2)  I-STAT CG4 LACTIC ACID, ED    EKG EKG Interpretation Date/Time:  Wednesday August 17 2023 07:44:07 EDT Ventricular Rate:  74 PR Interval:  159 QRS Duration:  141 QT Interval:  457 QTC Calculation: 508 R Axis:   26  Text Interpretation: Sinus rhythm Right bundle branch block Confirmed by Vonita Moss [id number]) on 08/17/2023 8:00:43 AM  Radiology DG Chest 2 View  Result Date: 08/17/2023 CLINICAL DATA:  72 year old female with shortness of breath. Asthma. Altered mental status. EXAM: CHEST - 2 VIEW COMPARISON:  Portable chest 05/25/2023 and earlier. FINDINGS: Portable AP upright view at 0821 hours. Stable cardiomegaly and mild tortuosity of the aorta. Other mediastinal contours are within normal limits. Visualized tracheal air column is within normal limits. Large lung volumes. There is patchy asymmetric lung opacity on the right along the minor fissure. But otherwise allowing for portable technique the lungs are clear. No pneumothorax or pleural effusion. No acute osseous abnormality identified. Negative visible bowel gas. IMPRESSION: 1. Patchy nonspecific right mid lung opacity. Mild or developing bronchopneumonia not excluded. 2. Otherwise stable chronic hyperinflation and cardiomegaly. Electronically Signed    By: Odessa Fleming M.D.   On: 08/17/2023 08:36    Procedures Procedures    Medications Ordered in ED Medications  acetaminophen (TYLENOL) tablet 650 mg (650 mg Oral Given 08/17/23 0842)  cefTRIAXone (ROCEPHIN) 2 g in sodium chloride 0.9 % 100 mL IVPB (0 g Intravenous Stopped 08/17/23 1203)  doxycycline (VIBRA-TABS) tablet 100 mg (100 mg Oral Given 08/17/23 1134)    ED Course/ Medical Decision Making/ A&P Clinical Course as of 08/17/23 1939  Wed Aug 17, 2023  0841 DG Chest 2 View IMPRESSION: 1. Patchy nonspecific right mid lung opacity. Mild or developing bronchopneumonia not excluded. 2. Otherwise stable chronic hyperinflation and cardiomegaly. [RP]    Clinical Course User Index [RP] Rondel Baton, MD                                 Medical Decision Making Amount and/or Complexity of Data Reviewed Labs: ordered. Radiology: ordered. Decision-making details documented in ED Course.  Risk OTC drugs. Prescription drug management.   Diamond [name] is a 72 y.o. female with comorbidities that complicate the patient evaluation including ESRD on Monday Wednesday Friday dialysis, hypertension, atrial flutter on eliquis, and hypertension who presents to the emergency department with cough.   Initial Ddx:  Pneumonia, URI, PE, electrolyte abnormality, volume overload  MDM/Course:  Patient presents to the emergency department with productive cough.  Has had sick contacts as well.  Initially was placed on 2 L nasal cannula but was able to be weaned to room air without difficulty.  Did consider PE but with her Eliquis use this is highly unlikely.  She had a chest x-ray that did show a right midlung opacity that was concerning for possible pneumonia.  Was started on ceftriaxone and doxycycline because her EKG did show prolonged QT.  No significant electrolyte abnormalities.  Upon re-evaluation patient remained stable on room air and was well-appearing.  Was discussed with her facility who will  have her physician reevaluate her within the next 2 to 3 days.  Since she is able to be monitored at her facility and is currently at her baseline feel that she will be safe to be discharged at this time.  Counseled the patient on symptoms that should prompt her return to the emergency department.  This patient presents to the ED for concern of complaints listed in HPI, this involves an extensive number of treatment options, and is a complaint that carries with it a high risk of complications and morbidity. Disposition including potential need for admission considered.   Dispo: DC to Facility  Additional history obtained from Nursing Home/Care Facility Records reviewed Outpatient Clinic Notes The following labs were independently interpreted: Chemistry and show CKD I independently reviewed the following imaging with scope of interpretation limited to determining acute life threatening conditions related to emergency care: Chest x-ray and agree with the radiologist interpretation with the following exceptions: none I personally reviewed and interpreted cardiac monitoring: normal sinus rhythm  I personally reviewed and interpreted the pt's EKG: see above for interpretation  I have reviewed the patients home medications and made adjustments as needed Social Determinants of health:  Elderly         Final Clinical Impression(s) / ED Diagnoses Final diagnoses:  Pneumonia of right middle lobe due to infectious organism  ESRD on dialysis [facility])    Rx / DC Orders ED Discharge Orders          Ordered    amoxicillin-clavulanate (AUGMENTIN) 875-125 MG tablet  Every 12 hours        08/17/23 1504    doxycycline (VIBRAMYCIN) 100 MG capsule  2 times daily        08/17/23 1504              Rondel Baton, MD 08/17/23 1939

## 2023-08-22 LAB — CULTURE, BLOOD (ROUTINE X 2)
Culture: NO GROWTH
Culture: NO GROWTH
Special Requests: ADEQUATE
Special Requests: ADEQUATE

## 2023-09-02 ENCOUNTER — Inpatient Hospital Stay (HOSPITAL_COMMUNITY)
Admission: EM | Admit: 2023-09-02 | Discharge: 2023-09-08 | DRG: 438 | Disposition: A | Discharge status: Skilled Nursing Facility | Payer: Medicare (Managed Care) | Source: Skilled Nursing Facility | Attending: Family Medicine | Admitting: Family Medicine

## 2023-09-02 ENCOUNTER — Encounter (HOSPITAL_COMMUNITY): Payer: Self-pay

## 2023-09-02 ENCOUNTER — Emergency Department (HOSPITAL_COMMUNITY): Payer: Medicare (Managed Care)

## 2023-09-02 DIAGNOSIS — E875 Hyperkalemia: Secondary | ICD-10-CM | POA: Diagnosis present

## 2023-09-02 DIAGNOSIS — K859 Acute pancreatitis without necrosis or infection, unspecified: Secondary | ICD-10-CM | POA: Diagnosis not present

## 2023-09-02 DIAGNOSIS — N186 End stage renal disease: Principal | ICD-10-CM | POA: Diagnosis present

## 2023-09-02 DIAGNOSIS — Z79890 Hormone replacement therapy (postmenopausal): Secondary | ICD-10-CM

## 2023-09-02 DIAGNOSIS — Z91013 Allergy to seafood: Secondary | ICD-10-CM

## 2023-09-02 DIAGNOSIS — I4892 Unspecified atrial flutter: Secondary | ICD-10-CM | POA: Diagnosis present

## 2023-09-02 DIAGNOSIS — R109 Unspecified abdominal pain: Secondary | ICD-10-CM

## 2023-09-02 DIAGNOSIS — D72829 Elevated white blood cell count, unspecified: Secondary | ICD-10-CM | POA: Diagnosis present

## 2023-09-02 DIAGNOSIS — I10 Essential (primary) hypertension: Secondary | ICD-10-CM | POA: Diagnosis present

## 2023-09-02 DIAGNOSIS — Z7951 Long term (current) use of inhaled steroids: Secondary | ICD-10-CM

## 2023-09-02 DIAGNOSIS — Z8249 Family history of ischemic heart disease and other diseases of the circulatory system: Secondary | ICD-10-CM

## 2023-09-02 DIAGNOSIS — R911 Solitary pulmonary nodule: Secondary | ICD-10-CM | POA: Diagnosis present

## 2023-09-02 DIAGNOSIS — Z7901 Long term (current) use of anticoagulants: Secondary | ICD-10-CM

## 2023-09-02 DIAGNOSIS — I480 Paroxysmal atrial fibrillation: Secondary | ICD-10-CM | POA: Diagnosis present

## 2023-09-02 DIAGNOSIS — Z79899 Other long term (current) drug therapy: Secondary | ICD-10-CM

## 2023-09-02 DIAGNOSIS — Z66 Do not resuscitate: Secondary | ICD-10-CM | POA: Diagnosis present

## 2023-09-02 DIAGNOSIS — R0602 Shortness of breath: Secondary | ICD-10-CM

## 2023-09-02 DIAGNOSIS — M898X9 Other specified disorders of bone, unspecified site: Secondary | ICD-10-CM | POA: Diagnosis present

## 2023-09-02 DIAGNOSIS — D649 Anemia, unspecified: Secondary | ICD-10-CM | POA: Diagnosis present

## 2023-09-02 DIAGNOSIS — N2581 Secondary hyperparathyroidism of renal origin: Secondary | ICD-10-CM | POA: Diagnosis present

## 2023-09-02 DIAGNOSIS — Z9049 Acquired absence of other specified parts of digestive tract: Secondary | ICD-10-CM

## 2023-09-02 DIAGNOSIS — G8929 Other chronic pain: Secondary | ICD-10-CM | POA: Diagnosis present

## 2023-09-02 DIAGNOSIS — Z801 Family history of malignant neoplasm of trachea, bronchus and lung: Secondary | ICD-10-CM

## 2023-09-02 DIAGNOSIS — D631 Anemia in chronic kidney disease: Secondary | ICD-10-CM | POA: Diagnosis present

## 2023-09-02 DIAGNOSIS — D61818 Other pancytopenia: Secondary | ICD-10-CM | POA: Diagnosis not present

## 2023-09-02 DIAGNOSIS — J45909 Unspecified asthma, uncomplicated: Secondary | ICD-10-CM | POA: Diagnosis present

## 2023-09-02 DIAGNOSIS — Z800 Family history of malignant neoplasm of digestive organs: Secondary | ICD-10-CM

## 2023-09-02 DIAGNOSIS — I120 Hypertensive chronic kidney disease with stage 5 chronic kidney disease or end stage renal disease: Secondary | ICD-10-CM | POA: Diagnosis present

## 2023-09-02 DIAGNOSIS — E039 Hypothyroidism, unspecified: Secondary | ICD-10-CM | POA: Diagnosis present

## 2023-09-02 DIAGNOSIS — Z992 Dependence on renal dialysis: Secondary | ICD-10-CM

## 2023-09-02 LAB — CBC WITH DIFFERENTIAL/PLATELET
Abs Immature Granulocytes: 0.03 10*3/uL (ref 0.00–0.07)
Basophils Absolute: 0 10*3/uL (ref 0.0–0.1)
Basophils Relative: 1 %
Eosinophils Absolute: 0.1 10*3/uL (ref 0.0–0.5)
Eosinophils Relative: 1 %
HCT: 24.1 % — ABNORMAL LOW (ref 36.0–46.0)
Hemoglobin: 8.2 g/dL — ABNORMAL LOW (ref 12.0–15.0)
Immature Granulocytes: 1 %
Lymphocytes Relative: 29 %
Lymphs Abs: 1.4 10*3/uL (ref 0.7–4.0)
MCH: 33.2 pg (ref 26.0–34.0)
MCHC: 34 g/dL (ref 30.0–36.0)
MCV: 97.6 fL (ref 80.0–100.0)
Monocytes Absolute: 0.7 10*3/uL (ref 0.1–1.0)
Monocytes Relative: 14 %
Neutro Abs: 2.7 10*3/uL (ref 1.7–7.7)
Neutrophils Relative %: 54 %
Platelets: 172 10*3/uL (ref 150–400)
RBC: 2.47 MIL/uL — ABNORMAL LOW (ref 3.87–5.11)
RDW: 16.7 % — ABNORMAL HIGH (ref 11.5–15.5)
WBC: 5 10*3/uL (ref 4.0–10.5)
nRBC: 0 % (ref 0.0–0.2)

## 2023-09-02 LAB — COMPREHENSIVE METABOLIC PANEL
ALT: 19 U/L (ref 0–44)
AST: 24 U/L (ref 15–41)
Albumin: 2.8 g/dL — ABNORMAL LOW (ref 3.5–5.0)
Alkaline Phosphatase: 85 U/L (ref 38–126)
Anion gap: 18 — ABNORMAL HIGH (ref 5–15)
BUN: 65 mg/dL — ABNORMAL HIGH (ref 8–23)
CO2: 24 mmol/L (ref 22–32)
Calcium: 9.4 mg/dL (ref 8.9–10.3)
Chloride: 93 mmol/L — ABNORMAL LOW (ref 98–111)
Creatinine, Ser: 8.43 mg/dL — ABNORMAL HIGH (ref 0.44–1.00)
GFR, Estimated: 5 mL/min — ABNORMAL LOW (ref >60)
Glucose, Bld: 80 mg/dL (ref 70–99)
Potassium: 5.8 mmol/L — ABNORMAL HIGH (ref 3.5–5.1)
Sodium: 135 mmol/L (ref 135–145)
Total Bilirubin: 0.8 mg/dL (ref 0.3–1.2)
Total Protein: 6.5 g/dL (ref 6.5–8.1)

## 2023-09-02 LAB — POTASSIUM: Potassium: 5.8 mmol/L — ABNORMAL HIGH (ref 3.5–5.1)

## 2023-09-02 LAB — MAGNESIUM: Magnesium: 2.3 mg/dL (ref 1.7–2.4)

## 2023-09-02 LAB — TROPONIN I (HIGH SENSITIVITY)
Troponin I (High Sensitivity): 29 ng/L — ABNORMAL HIGH (ref <18)
Troponin I (High Sensitivity): 29 ng/L — ABNORMAL HIGH (ref <18)

## 2023-09-02 LAB — LIPASE, BLOOD: Lipase: 534 U/L — ABNORMAL HIGH (ref 11–51)

## 2023-09-02 MED ORDER — INSULIN ASPART 100 UNIT/ML IV SOLN
5.0000 [IU] | Freq: Once | INTRAVENOUS | Status: AC
  Administered 2023-09-02: 5 [IU] via INTRAVENOUS

## 2023-09-02 MED ORDER — DEXTROSE 50 % IV SOLN
1.0000 | Freq: Once | INTRAVENOUS | Status: AC
  Administered 2023-09-02: 50 mL via INTRAVENOUS
  Filled 2023-09-02: qty 50

## 2023-09-02 MED ORDER — SODIUM ZIRCONIUM CYCLOSILICATE 10 G PO PACK
10.0000 g | PACK | Freq: Two times a day (BID) | ORAL | Status: DC
  Administered 2023-09-02 – 2023-09-04 (×5): 10 g via ORAL
  Filled 2023-09-02 (×7): qty 1

## 2023-09-02 MED ORDER — MORPHINE SULFATE (PF) 2 MG/ML IV SOLN
2.0000 mg | Freq: Once | INTRAVENOUS | Status: AC
  Administered 2023-09-02: 2 mg via INTRAVENOUS
  Filled 2023-09-02: qty 1

## 2023-09-02 MED ORDER — ALBUTEROL SULFATE (2.5 MG/3ML) 0.083% IN NEBU
10.0000 mg | INHALATION_SOLUTION | Freq: Once | RESPIRATORY_TRACT | Status: AC
  Administered 2023-09-02: 10 mg via RESPIRATORY_TRACT
  Filled 2023-09-02: qty 12

## 2023-09-02 MED ORDER — HYDRALAZINE HCL 20 MG/ML IJ SOLN
10.0000 mg | Freq: Once | INTRAMUSCULAR | Status: AC
  Administered 2023-09-02: 10 mg via INTRAVENOUS
  Filled 2023-09-02: qty 1

## 2023-09-02 MED ORDER — IOHEXOL 350 MG/ML SOLN
75.0000 mL | Freq: Once | INTRAVENOUS | Status: AC | PRN
  Administered 2023-09-02: 75 mL via INTRAVENOUS

## 2023-09-02 NOTE — ED Notes (Signed)
CT notified of IV placement. 

## 2023-09-02 NOTE — Assessment & Plan Note (Signed)
Pt has history of ESRD (MWF schedule) but was too ill to make her dialysis appointment today (9/27). - Nephrology aware, will schedule her for 9/28 - Avoiding nephrotoxic medications - Dosing sevelemer per pharmacy recommendations, appreciate their assistance

## 2023-09-02 NOTE — ED Triage Notes (Signed)
Ongoing abdominal and back pain x 2 days. 2 vomiting episodes since last night. Hx of dementia and at baseline mentation. On dialysis MWF. Missed dialysis today. Lives in [facility].

## 2023-09-02 NOTE — ED Notes (Signed)
Patient transported to CT 

## 2023-09-02 NOTE — H&P (Shared)
Hospital Admission History and Physical Service Pager: [phone]  Patient name: Diamond [name] Medical record number: [id number] Date of Birth: 05-10-1951 Age: 72 y.o. Gender: female  Primary Care Provider: [facility] Consultants: Nephrology Code Status: DNR, has paperwork   Preferred Emergency Contact: elizabeht, jackley (Daughter) [phone] (Mobile)    Chief Complaint: Abdominal pain radiating to back  Assessment and Plan: Diamond [name] is a 72 y.o. female presenting with 2 days of acute abdominal pain radiating to back. Differential for presentation of this includes acute pancreatitis with uncertain etiology. Aortic dissection was considered, however the patient's hemodynamic stability and the CT angiogram makes it lower on the differential. Also considered cholecystitis, but patient had gallbladder removed, and considered appendicitis, and SBO, but this was ruled out on CT. Also considered constipation and renal stones, but patient having normal bathroom habits. Also considered ACS but troponins flat x 2.   Assessment & Plan Acute pancreatitis, unspecified complication status, unspecified pancreatitis type Pt has acute abdominal pain that began 9/26 and has gradually been getting worse. It radiates to her back and is severe. Serum lipase came back at 534. CT angiogram Abd/Pelv obtained didn'tGiven abdominal pain and lipase, most concerning for pancreatitis. Pt does not have history of pancreatitis. Morphine was ordered in ED for pain control, her pain is well controlled for now. Will switch pain medications when she needs additional support due to her ESRD status - Admit to FMTS, progressive care. Attending physician Dr Pearlean Brownie - Vitals Q4h - Continuous pulse ox - LR will be started at 88 ml / hour for 8 hours  - Consider fentanyl or dilaudid when she needs additional IV pain control - NPO, reassess in am - AM CBC, BMP Hyperkalemia K  5.8 on admission her potassium, likely 2/2 missed HD session today. The ED administered lokelma, insulin/dextrose, and albuterol nebulizer - Recheck K, next collection at 0030.  ESRD on hemodialysis (HCC) Pt has history of ESRD (MWF schedule) but was too ill to make her dialysis appointment today (9/27). - Nephrology aware, will schedule her for 9/28 - Avoiding nephrotoxic medications - Dosing sevelemer per pharmacy recommendations, appreciate their assistance Anemia, unspecified type Patient noted to have Hgb 8.2, previously 10.7 2 weeks ago. MCV 97.6 suggest concerns for bleeding vs chronic disease as etiology. Patient chronically low and tends to reside around 8-9. Will CTM -CTM -Consider FOBT -AM CBC    Chronic and Stable Problems:  Hypertension: Metoprolol tartate 50 mg, Hydralizine 50 mg (for SBP>170) Hypothyroidism: levothyroxine 25 PAF: Eliquis 2.5 mg BID, metoprolol 50 mg, Amiodarone 200 mg Asthma: albuterol, breo ellipta Chronic pain: gabapentin 300 mg on dialysis days, lidocaine patches, menthol analgesic gel  FEN/GI: NPO at this time VTE Prophylaxis: SCDs  Disposition: Admit to the progressive care floor.  History of Present Illness:  Diamond [name] is a 72 y.o. female presenting with acute abdominal pain.  Belly pain began yesterday (9/26) and she took some mylanta to help. Her pain continued onto today, and she came in to be seen. She's never had pain like this before, and notes it's located in her epigastric area. Also notes her pain was allover/wrapping around to her back. She also appreciates some nausea and vomiting. She still makes urine and reports normal urination/bowel habits. Patient reports that she was supposed to go to dialysis today but she was unable to go due to her pain.  Patient denies any fever or chills she reports she was  vomiting this morning.  She was not able to keep any fluids down earlier in the day.    In the ED patient arrived w/ stable vitals,  complaining of abdominal pain, and received 2 mg morphine. Her laps were significant for an elevated lipase of 534, an elevated K of 5.8, and negative troponins x 2.    Review Of Systems: Per HPI with the following additions:   Pertinent Past Medical History: Asthma Atrial flutter  A. Fib ESRD MWF Gout Hepatitis C Hypertension Right bundle branch block Paroxysmal Atrial fibrillation Hx of Osteomyelitis Hx of compression fracture of L3  Remainder reviewed in history tab.   Pertinent Past Surgical History: Cholecystectomy 2023 Colonoscopy 2023 (with polypectomy)    Remainder reviewed in history tab.  Pertinent Social History: Tobacco use: No Alcohol use: No Other Substance use: None Lives with SNF for last 2 years  Pertinent Family History: HTN - mother  Asthma - father  Remainder reviewed in history tab.   Important Outpatient Medications: Amiodarone 200 mg, daily Pantoprazole 40 mg daily Gabapentin 300 mg MWF after HD Synthroid 25 mg  Eliquis 2.5 mg BID Metoprolol tartate 50 mg BID Hydralizine 50 mg BID on HD days Symbicort 250-50 (Fluticasone slametrol) Clonidine 0.1 mg q6h prn for SBP > 170 Hydrocodone ascetameniophen 7.5-325 BID for leg/low back pain Sevelamer  Remainder reviewed in medication history.   Objective: BP (!) 175/99 (BP Location: Right Arm)   Pulse (!) 58   Temp 98 F (36.7 C) (Oral)   Resp 17   Ht 5\' 6"  (1.676 m)   Wt 59 kg   SpO2 100%   BMI 20.99 kg/m  Exam: General: Pt is a thin, ill-appearing woman wearing a nightcap, wrapped tightly under blankets.  Eyes: Pupils constricted bilaterally, EOMI ENTM: MM dry Cardiovascular: RRR, no murmurs, rubs or gallops appreciated.  Respiratory: CTAB, no wheezes or rattles.  Gastrointestinal: epigastric tenderness to palpation that radiated to patient's back MSK: Pt has thin limbs, indicative of deconditioning Derm: dry, coarse skin. Neuro: Pt is AxO times 3, and has no focal deficits on  exam Psych: Patient described her mood as "hungry". Affect: irritable and uncooperative.  Labs:  CBC BMET  Recent Labs  Lab 09/02/23 1256  WBC 5.0  HGB 8.2*  HCT 24.1*  PLT 172   Recent Labs  Lab 09/02/23 1256  NA 135  K 5.8*  CL 93*  CO2 24  BUN 65*  CREATININE 8.43*  GLUCOSE 80  CALCIUM 9.4    Pertinent additional labs: Lipase     Component Value Date/Time   LIPASE 534 (H) 09/02/2023 1256     EKG: Rate 58, QTC 465. Sinus rhythm, right bundle branch block most visible on 2, v2. Consistent with previous EKG from 9/11.   Imaging Studies Performed:  CT Angio Chest/Abd/Pelvis: IMPRESSION: 1. No aortic aneurysm or dissection. 2. 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 Further evaluation with MRI without and with contrast is recommended. 3. Clusters of ground-glass nodularity involving the right upper lobe most consistent with developing infiltrate. 4. Right lower lobe pleural based consolidation may represent scarring. Underlying mass is not excluded. Close follow-up with CT or further evaluation with PET after resolution of acute inflammatory process recommended. 5. No bowel obstruction. Normal appendix. 6.  Aortic Atherosclerosis (ICD10-I70.0).   Margaretmary Dys, MD 09/03/2023, 12:10 AM PGY-1, [facility] Family Medicine  FPTS Intern pager: [phone], text pages welcome Secure chat  group [facility] Teaching Service   Upper Level Addendum: I have seen and evaluated this patient along with Dr. Weston Settle and reviewed the above note, making necessary revisions as appropriate. I agree with the medical decision making and physical exam as noted above. Bess Kinds, MD PGY-2 [facility] Family Medicine Residency

## 2023-09-02 NOTE — ED Provider Notes (Signed)
[facility] AT [facility] Provider Note   CSN: [id number] Arrival date & time: 09/02/23  1213     History  Chief Complaint  Patient presents with   Abdominal Pain    Diamond [name] is a 72 y.o. female.  Patient complains of abdominal pain.  Patient reports the pain in her abdomen radiates to her back.  Patient reports the pain began 2 days ago and has become more severe.  Patient reports that she was supposed to go to dialysis today but she was unable to go due to her pain.  Patient denies any fever or chills she reports she was vomiting this morning.  She was not able to keep any fluids down earlier in the day.  Patient has a history of anemia chronic kidney disease on dialysis, vertebral osteomyelitis treated by infectious disease, A-fib and hepatitis C  The history is provided by the patient. No language interpreter was used.  Abdominal Pain Pain location:  Generalized Pain quality: aching   Pain severity:  Severe Onset quality:  Gradual Duration:  2 days Timing:  Constant Progression:  Worsening Chronicity:  New Context: not alcohol use   Relieved by:  Nothing Ineffective treatments:  None tried Associated symptoms: vomiting        Home Medications Prior to Admission medications   Medication Sig Start Date End Date Taking? Authorizing Provider  acetaminophen (TYLENOL) 500 MG tablet Take 1,000 mg by mouth every 8 (eight) hours as needed (for back and leg pain). Take 2 tablets (1000mg  total) by mouth every 8 hours as needed for back and leg pain.    [provider]  albuterol (VENTOLIN HFA) 108 (90 Base) MCG/ACT inhaler Inhale 2 puffs into the lungs every 4 (four) hours as needed for wheezing or shortness of breath. 10/06/22   Zadie Rhine, MD  amiodarone (PACERONE) 200 MG tablet Take 1 tablet (200 mg total) by mouth daily. 01/27/23   Dorcas Carrow, MD  amoxicillin-clavulanate (AUGMENTIN) 875-125 MG tablet Take 1 tablet by mouth  every 12 (twelve) hours. 08/17/23   Rondel Baton, MD  apixaban (ELIQUIS) 2.5 MG TABS tablet Take 1 tablet (2.5 mg total) by mouth 2 (two) times daily. 10/06/22 12/01/23  Zadie Rhine, MD  cloNIDine (CATAPRES) 0.1 MG tablet Take 0.1 mg by mouth every 6 (six) hours as needed (SBP > 170).    [provider]  Diaper Rash Products (DESITIN MULTI-PURPOSE HEALING) OINT Apply 1 Application topically 3 (three) times daily as needed (with incontinence to prevent skin breakdown).    [provider]  Emollient Juanell Fairly) LOTN Apply 1 Application topically 3 (three) times daily as needed (dry skin).    [provider]  fluticasone-salmeterol (ADVAIR DISKUS) 250-50 MCG/ACT AEPB Inhale 1 puff into the lungs in the morning and at bedtime. 10/06/22   Zadie Rhine, MD  gabapentin (NEURONTIN) 300 MG capsule Take 300 mg by mouth 3 (three) times a week. Take on Mondays, Wednesdays and Fridays after dialysis.    [provider]  hydrALAZINE (APRESOLINE) 50 MG tablet Take 50 mg by mouth 3 (three) times daily. Take on non H.D days; Tuesday, Thursday, saturday    [provider]  HYDROcodone-acetaminophen (NORCO) 7.5-325 MG tablet Take 1 tablet by mouth 2 (two) times daily.    [provider]  lidocaine 4 % Place 1 patch onto the skin daily. Apply for 12 hours to lower back and leave off for 12 hours between patches.    [provider]  Menthol, Topical Analgesic, (BIOFREEZE) 4 % GEL Apply 1 Application topically 3 (three) times daily as needed (lower back and knee pain).    [provider]  Methoxy PEG-Epoetin Beta (MIRCERA IJ) Mircera 10/09/22 10/08/23  [provider]  metoprolol tartrate (LOPRESSOR) 50 MG tablet Take 1 tablet (50 mg total) by mouth 2 (two) times daily. 01/26/23   Dorcas Carrow, MD  pantoprazole (PROTONIX) 40 MG tablet Take 1 tablet (40 mg total) by mouth daily. 10/06/22   Zadie Rhine, MD  sevelamer (RENAGEL) 800 MG  tablet Take 800 mg by mouth as directed. Take 2 tablets (1600mg  total) by mouth 3 times a day every Tuesday, Thursday and Saturday. Take 2 tablets (1600mg  total) with meals every Monday, Wednesday, Friday and Sunday.    [provider]  sulfamethoxazole-trimethoprim (BACTRIM DS) 800-160 MG tablet Take 1 tablet by mouth 2 (two) times daily. Patient taking differently: Take 1 tablet by mouth 2 (two) times daily. For MRSA infection 03/17/23 09/13/23  Vu, Tonita Phoenix, MD      Allergies    Shellfish-derived products, Shrimp (diagnostic), and Other    Review of Systems   Review of Systems  Gastrointestinal:  Positive for abdominal pain and vomiting.  All other systems reviewed and are negative.   Physical Exam Updated Vital Signs BP 138/79   Pulse (!) 56   Temp 97.9 F (36.6 C) (Oral)   Resp 10   Ht 5\' 6"  (1.676 m)   Wt 59 kg   SpO2 100%   BMI 20.99 kg/m  Physical Exam Vitals reviewed.  HENT:     Head: Normocephalic.  Cardiovascular:     Rate and Rhythm: Normal rate and regular rhythm.  Pulmonary:     Effort: Pulmonary effort is normal.     Breath sounds: Normal breath sounds.  Abdominal:     General: Abdomen is flat. Bowel sounds are normal.     Palpations: Abdomen is soft.     Tenderness: There is generalized abdominal tenderness.     Hernia: No hernia is present.  Skin:    General: Skin is warm.  Neurological:     General: No focal deficit present.     Mental Status: She is alert.  Psychiatric:        Mood and Affect: Mood normal.     ED Results / Procedures / Treatments   Labs (all labs ordered are listed, but only abnormal results are displayed) Labs Reviewed  CBC WITH DIFFERENTIAL/PLATELET - Abnormal; Notable for the following components:      Result Value   RBC 2.47 (*)    Hemoglobin 8.2 (*)    HCT 24.1 (*)    RDW 16.7 (*)    All other components within normal limits  COMPREHENSIVE METABOLIC PANEL - Abnormal; Notable for the following components:    Potassium 5.8 (*)    Chloride 93 (*)    BUN 65 (*)    Creatinine, Ser 8.43 (*)    Albumin 2.8 (*)    GFR, Estimated 5 (*)    Anion gap 18 (*)    All other components within normal limits  LIPASE, BLOOD - Abnormal; Notable for the following components:   Lipase 534 (*)    All other components within normal limits  TROPONIN I (HIGH SENSITIVITY) - Abnormal; Notable for the following components:   Troponin I (High Sensitivity) 29 (*)    All other components within normal limits  TROPONIN I (HIGH SENSITIVITY) - Abnormal; Notable  for the following components:   Troponin I (High Sensitivity) 29 (*)    All other components within normal limits  MAGNESIUM  URINALYSIS, ROUTINE W REFLEX MICROSCOPIC    EKG None  Radiology CT Angio Chest/Abd/Pel for Dissection W and/or Wo Contrast  Result Date: 09/02/2023 CLINICAL DATA:  Abdominal pain.  Concern for aortic aneurysm. EXAM: CT ANGIOGRAPHY CHEST, ABDOMEN AND PELVIS TECHNIQUE: Non-contrast CT of the chest was initially obtained. Multidetector CT imaging through the chest, abdomen and pelvis was performed using the standard protocol during bolus administration of intravenous contrast. Multiplanar reconstructed images and MIPs were obtained and reviewed to evaluate the vascular anatomy. RADIATION DOSE REDUCTION: This exam was performed according to the departmental dose-optimization program which includes automated exposure control, adjustment of the mA and/or kV according to patient size and/or use of iterative reconstruction technique. CONTRAST:  75mL OMNIPAQUE IOHEXOL 350 MG/ML SOLN COMPARISON:  CT abdomen pelvis dated 07/07/2022. chest CT dated 06/15/2022. FINDINGS: CTA CHEST FINDINGS Cardiovascular: There is no cardiomegaly or pericardial effusion. Coronary vascular calcification of the LAD and left circumflex artery. There is mild atherosclerotic calcification of the thoracic aorta. No aneurysmal dilatation or dissection. The origins of the great  vessels of the aortic arch appear patent. Mild dilatation and irregularity of the left subclavian vein and left upper extremity veins secondary to AV fistula. The central pulmonary arteries appear patent. Mediastinum/Nodes: No hilar or mediastinal adenopathy. The esophagus is grossly unremarkable. No mediastinal fluid collection. Lungs/Pleura: Clusters of ground-glass nodularity involving the right upper lobe most consistent with developing infiltrate. There is a triangular pleural based area of consolidation in the right lower lobe, new since the CT of 06/15/2022, which may represent scarring. Underlying mass is not excluded. Close follow-up with CT or further evaluation with PET after resolution of acute inflammatory process recommended. Similar appearance of an 8 mm right middle lobe and a 5 mm left lower lobe nodules. There is no pleural effusion or pneumothorax. The central airways are patent. Musculoskeletal: Osteopenia with degenerative changes of the spine. Left breast calcification. No acute osseous pathology. Review of the MIP images confirms the above findings. CTA ABDOMEN AND PELVIS FINDINGS VASCULAR Aorta: Moderate atherosclerotic calcification. No aneurysmal dilatation or dissection. The aorta is tortuous. Celiac: Atherosclerotic calcification of the origin of the celiac axis. The celiac artery and its major branches are patent. SMA: The SMA is patent. Renals: The renal arteries appear patent. IMA: The IMA is patent. Inflow: Moderate atherosclerotic calcification the iliac arteries. The iliac arteries are patent. No aneurysm or dissection. Veins: No obvious venous abnormality within the limitations of this arterial phase study. Review of the MIP images confirms the above findings. NON-VASCULAR No intra-abdominal free air or free fluid. Hepatobiliary: The liver is unremarkable. Cholecystectomy. Mild biliary ductal dilatation likely post cholecystectomy. The common bile duct measures approximately 1 cm  in diameter. No calcified stone noted in the central CBD. Pancreas: There is mild dilatation of the main pancreatic duct similar to prior CT. No active inflammatory changes. Spleen: Normal in size without focal abnormality. Adrenals/Urinary Tract: The adrenal glands unremarkable. Moderate bilateral renal parenchyma atrophy. Multiple bilateral renal cysts and additional smaller hypodense lesions are not characterized on this CT. There is no hydronephrosis on either side. The visualized ureters and urinary bladder appear unremarkable. Stomach/Bowel: Moderate stool throughout the colon. There is no bowel obstruction or active inflammation. The appendix is normal. Lymphatic: No adenopathy. Reproductive: The uterus is grossly unremarkable. No adnexal masses. Other: None Musculoskeletal: Osteopenia with degenerative changes. There  is severe fragmentation of the L3 with approximately 40% loss of vertebral body height. There is a 7 mm retropulsed fragment of the superior posterior cortex causing moderate focal narrowing of the central canal. Additional areas of fragmentation involving the inferior endplate of L2. Findings concerning for spondylo-discitis. Further evaluation with MRI without and with contrast is recommended. Review of the MIP images confirms the above findings. IMPRESSION: 1. No aortic aneurysm or dissection. 2. 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 Further evaluation with MRI without and with contrast is recommended. 3. Clusters of ground-glass nodularity involving the right upper lobe most consistent with developing infiltrate. 4. Right lower lobe pleural based consolidation may represent scarring. Underlying mass is not excluded. Close follow-up with CT or further evaluation with PET after resolution of acute inflammatory process recommended. 5. No bowel obstruction. Normal appendix. 6.  Aortic Atherosclerosis  (ICD10-I70.0). Electronically Signed   By: Elgie Collard M.D.   On: 09/02/2023 19:24    Procedures .Critical Care  Performed by: Elson Areas, PA-C Authorized by: Elson Areas, PA-C   Critical care provider statement:    Critical care time (minutes):  45   Critical care start time:  09/02/2023 1:00 PM   Critical care end time:  09/02/2023 9:32 PM   Critical care time was exclusive of:  Separately billable procedures and treating other patients   Critical care was necessary to treat or prevent imminent or life-threatening deterioration of the following conditions:  Cardiac failure, circulatory failure, CNS failure or compromise, dehydration, metabolic crisis and renal failure   Critical care was time spent personally by me on the following activities:  Blood draw for specimens, development of treatment plan with patient or surrogate, discussions with consultants, discussions with primary provider, evaluation of patient's response to treatment, examination of patient, interpretation of cardiac output measurements, re-evaluation of patient's condition, pulse oximetry, ordering and review of radiographic studies and ordering and review of laboratory studies   Care discussed with: admitting provider       Medications Ordered in ED Medications  morphine (PF) 2 MG/ML injection 2 mg (2 mg Intravenous Given 09/02/23 1615)  iohexol (OMNIPAQUE) 350 MG/ML injection 75 mL (75 mLs Intravenous Contrast Given 09/02/23 1804)    ED Course/ Medical Decision Making/ A&P                                 Medical Decision Making Patient complains of abdominal pain for the past 2 days.  Patient reports vomiting today.  She was supposed to have dialysis today but could not go due to pain and vomiting  Amount and/or Complexity of Data Reviewed Independent Historian: EMS    Details: Patient brought in by EMS due to vomiting today External Data Reviewed: notes.    Details: Hospitalist notes  reviewed Labs: ordered. Decision-making details documented in ED Course.    Details: Patient has an elevated lipase of 534. Calcium is 5.8 Radiology: ordered and independent interpretation performed. Decision-making details documented in ED Course.    Details: CT chest and abdomen angio shows pancreatic swelling, Osteo myelitis/discitis L2-L3.  This is consistent with patient's previous CT scan ECG/medicine tests: ordered and independent interpretation performed.    Details: EKG reviewed normal sinus. Discussion of management or test interpretation with external provider(s): I discussed the patient with unassigned medicine.  Family medicine will admit. I consulted nephrology Dr.  Ronalee Belts will arrange for patient to have dialysis.  He request patient receive hyperkalemia protocol including Lokelma.            Final Clinical Impression(s) / ED Diagnoses Final diagnoses:  Abdominal pain, unspecified abdominal location  ESRD on hemodialysis (HCC)  Paroxysmal atrial flutter (HCC)  Acute pancreatitis, unspecified complication status, unspecified pancreatitis type  Hyperkalemia    Rx / DC Orders ED Discharge Orders     None         Osie Cheeks 09/02/23 2135    Glyn Ade, MD 09/03/23 [phone]

## 2023-09-02 NOTE — Assessment & Plan Note (Signed)
Pt has history of hypokalemia, however on admission her potassium was 5.  - ED administered lokelma, insulin/dextrose, and albuterol nebulizer -

## 2023-09-03 ENCOUNTER — Encounter (HOSPITAL_COMMUNITY): Payer: Self-pay

## 2023-09-03 ENCOUNTER — Other Ambulatory Visit: Payer: Self-pay

## 2023-09-03 DIAGNOSIS — Z800 Family history of malignant neoplasm of digestive organs: Secondary | ICD-10-CM | POA: Diagnosis not present

## 2023-09-03 DIAGNOSIS — G8929 Other chronic pain: Secondary | ICD-10-CM | POA: Diagnosis present

## 2023-09-03 DIAGNOSIS — E875 Hyperkalemia: Secondary | ICD-10-CM | POA: Diagnosis present

## 2023-09-03 DIAGNOSIS — D61818 Other pancytopenia: Secondary | ICD-10-CM | POA: Diagnosis not present

## 2023-09-03 DIAGNOSIS — Z801 Family history of malignant neoplasm of trachea, bronchus and lung: Secondary | ICD-10-CM | POA: Diagnosis not present

## 2023-09-03 DIAGNOSIS — J45909 Unspecified asthma, uncomplicated: Secondary | ICD-10-CM | POA: Diagnosis present

## 2023-09-03 DIAGNOSIS — Z992 Dependence on renal dialysis: Secondary | ICD-10-CM | POA: Diagnosis not present

## 2023-09-03 DIAGNOSIS — Z79890 Hormone replacement therapy (postmenopausal): Secondary | ICD-10-CM | POA: Diagnosis not present

## 2023-09-03 DIAGNOSIS — Z66 Do not resuscitate: Secondary | ICD-10-CM | POA: Diagnosis present

## 2023-09-03 DIAGNOSIS — Z91013 Allergy to seafood: Secondary | ICD-10-CM | POA: Diagnosis not present

## 2023-09-03 DIAGNOSIS — D649 Anemia, unspecified: Secondary | ICD-10-CM | POA: Diagnosis not present

## 2023-09-03 DIAGNOSIS — I4892 Unspecified atrial flutter: Secondary | ICD-10-CM | POA: Diagnosis present

## 2023-09-03 DIAGNOSIS — K859 Acute pancreatitis without necrosis or infection, unspecified: Secondary | ICD-10-CM | POA: Diagnosis present

## 2023-09-03 DIAGNOSIS — Z8249 Family history of ischemic heart disease and other diseases of the circulatory system: Secondary | ICD-10-CM | POA: Diagnosis not present

## 2023-09-03 DIAGNOSIS — M898X9 Other specified disorders of bone, unspecified site: Secondary | ICD-10-CM | POA: Diagnosis present

## 2023-09-03 DIAGNOSIS — Z9049 Acquired absence of other specified parts of digestive tract: Secondary | ICD-10-CM | POA: Diagnosis not present

## 2023-09-03 DIAGNOSIS — Z79899 Other long term (current) drug therapy: Secondary | ICD-10-CM | POA: Diagnosis not present

## 2023-09-03 DIAGNOSIS — N186 End stage renal disease: Secondary | ICD-10-CM | POA: Diagnosis present

## 2023-09-03 DIAGNOSIS — I120 Hypertensive chronic kidney disease with stage 5 chronic kidney disease or end stage renal disease: Secondary | ICD-10-CM | POA: Diagnosis present

## 2023-09-03 DIAGNOSIS — Z7951 Long term (current) use of inhaled steroids: Secondary | ICD-10-CM | POA: Diagnosis not present

## 2023-09-03 DIAGNOSIS — E039 Hypothyroidism, unspecified: Secondary | ICD-10-CM | POA: Diagnosis present

## 2023-09-03 DIAGNOSIS — N2581 Secondary hyperparathyroidism of renal origin: Secondary | ICD-10-CM | POA: Diagnosis present

## 2023-09-03 DIAGNOSIS — I480 Paroxysmal atrial fibrillation: Secondary | ICD-10-CM | POA: Diagnosis present

## 2023-09-03 DIAGNOSIS — Z7901 Long term (current) use of anticoagulants: Secondary | ICD-10-CM | POA: Diagnosis not present

## 2023-09-03 DIAGNOSIS — D631 Anemia in chronic kidney disease: Secondary | ICD-10-CM | POA: Diagnosis present

## 2023-09-03 LAB — CBC
HCT: 25.2 % — ABNORMAL LOW (ref 36.0–46.0)
Hemoglobin: 8.4 g/dL — ABNORMAL LOW (ref 12.0–15.0)
MCH: 32.8 pg (ref 26.0–34.0)
MCHC: 33.3 g/dL (ref 30.0–36.0)
MCV: 98.4 fL (ref 80.0–100.0)
Platelets: 168 10*3/uL (ref 150–400)
RBC: 2.56 MIL/uL — ABNORMAL LOW (ref 3.87–5.11)
RDW: 16.7 % — ABNORMAL HIGH (ref 11.5–15.5)
WBC: 5.4 10*3/uL (ref 4.0–10.5)
nRBC: 0 % (ref 0.0–0.2)

## 2023-09-03 LAB — BASIC METABOLIC PANEL
Anion gap: 13 (ref 5–15)
BUN: 69 mg/dL — ABNORMAL HIGH (ref 8–23)
CO2: 25 mmol/L (ref 22–32)
Calcium: 9.2 mg/dL (ref 8.9–10.3)
Chloride: 95 mmol/L — ABNORMAL LOW (ref 98–111)
Creatinine, Ser: 9.42 mg/dL — ABNORMAL HIGH (ref 0.44–1.00)
GFR, Estimated: 4 mL/min — ABNORMAL LOW (ref >60)
Glucose, Bld: 108 mg/dL — ABNORMAL HIGH (ref 70–99)
Potassium: 4.9 mmol/L (ref 3.5–5.1)
Sodium: 133 mmol/L — ABNORMAL LOW (ref 135–145)

## 2023-09-03 LAB — GLUCOSE, CAPILLARY: Glucose-Capillary: 79 mg/dL (ref 70–99)

## 2023-09-03 LAB — HEPATITIS B SURFACE ANTIGEN: Hepatitis B Surface Ag: NONREACTIVE

## 2023-09-03 LAB — POTASSIUM: Potassium: 4.6 mmol/L (ref 3.5–5.1)

## 2023-09-03 MED ORDER — CLONIDINE HCL 0.1 MG PO TABS: 0.1000 mg | ORAL_TABLET | Freq: Four times a day (QID) | ORAL | Status: DC | PRN

## 2023-09-03 MED ORDER — DESITIN MULTI-PURPOSE HEALING EX OINT: 1.0000 | TOPICAL_OINTMENT | Freq: Three times a day (TID) | CUTANEOUS | Status: DC | PRN

## 2023-09-03 MED ORDER — PANTOPRAZOLE SODIUM 40 MG PO TBEC
40.0000 mg | DELAYED_RELEASE_TABLET | Freq: Every day | ORAL | Status: DC
  Administered 2023-09-03 – 2023-09-08 (×6): 40 mg via ORAL
  Filled 2023-09-03 (×6): qty 1

## 2023-09-03 MED ORDER — METOPROLOL TARTRATE 50 MG PO TABS
50.0000 mg | ORAL_TABLET | Freq: Two times a day (BID) | ORAL | Status: DC
  Administered 2023-09-03 – 2023-09-08 (×11): 50 mg via ORAL
  Filled 2023-09-03 (×12): qty 1

## 2023-09-03 MED ORDER — APIXABAN 2.5 MG PO TABS
2.5000 mg | ORAL_TABLET | Freq: Two times a day (BID) | ORAL | Status: DC
  Administered 2023-09-03 – 2023-09-04 (×4): 2.5 mg via ORAL
  Filled 2023-09-03 (×4): qty 1

## 2023-09-03 MED ORDER — ALBUTEROL SULFATE (2.5 MG/3ML) 0.083% IN NEBU
3.0000 mL | INHALATION_SOLUTION | RESPIRATORY_TRACT | Status: DC | PRN
  Administered 2023-09-08: 3 mL via RESPIRATORY_TRACT
  Filled 2023-09-03: qty 3

## 2023-09-03 MED ORDER — LACTATED RINGERS IV SOLN: INTRAVENOUS | Status: AC

## 2023-09-03 MED ORDER — FLUTICASONE FUROATE-VILANTEROL 200-25 MCG/ACT IN AEPB
1.0000 | INHALATION_SPRAY | Freq: Every day | RESPIRATORY_TRACT | Status: DC
  Administered 2023-09-04 – 2023-09-08 (×4): 1 via RESPIRATORY_TRACT
  Filled 2023-09-03: qty 28

## 2023-09-03 MED ORDER — ACETAMINOPHEN 325 MG PO TABS
650.0000 mg | ORAL_TABLET | Freq: Four times a day (QID) | ORAL | Status: DC | PRN
  Administered 2023-09-03 – 2023-09-08 (×4): 650 mg via ORAL
  Filled 2023-09-03 (×4): qty 2

## 2023-09-03 MED ORDER — CAMPHOR-MENTHOL 0.5-0.5 % EX LOTN: TOPICAL_LOTION | Freq: Three times a day (TID) | CUTANEOUS | Status: DC | PRN

## 2023-09-03 MED ORDER — GABAPENTIN 300 MG PO CAPS: 300.0000 mg | ORAL_CAPSULE | ORAL | Status: DC

## 2023-09-03 MED ORDER — HYDRALAZINE HCL 50 MG PO TABS
50.0000 mg | ORAL_TABLET | ORAL | Status: DC
  Administered 2023-09-05 – 2023-09-07 (×4): 50 mg via ORAL
  Filled 2023-09-03 (×4): qty 1

## 2023-09-03 MED ORDER — ZINC OXIDE 40 % EX OINT: TOPICAL_OINTMENT | Freq: Three times a day (TID) | CUTANEOUS | Status: DC | PRN

## 2023-09-03 MED ORDER — GABAPENTIN 300 MG PO CAPS
300.0000 mg | ORAL_CAPSULE | Freq: Once | ORAL | Status: AC
  Administered 2023-09-03: 300 mg via ORAL
  Filled 2023-09-03: qty 1

## 2023-09-03 MED ORDER — ONDANSETRON HCL 4 MG PO TABS
4.0000 mg | ORAL_TABLET | Freq: Four times a day (QID) | ORAL | Status: DC | PRN
  Administered 2023-09-06: 4 mg via ORAL
  Filled 2023-09-03: qty 1

## 2023-09-03 MED ORDER — MINERIN EX LOTN: 1.0000 | TOPICAL_LOTION | Freq: Three times a day (TID) | CUTANEOUS | Status: DC | PRN

## 2023-09-03 MED ORDER — MENTHOL (TOPICAL ANALGESIC) 4 % EX GEL: 1.0000 | Freq: Three times a day (TID) | CUTANEOUS | Status: DC | PRN

## 2023-09-03 MED ORDER — LEVOTHYROXINE SODIUM 25 MCG PO TABS
25.0000 ug | ORAL_TABLET | Freq: Every day | ORAL | Status: DC
  Administered 2023-09-03 – 2023-09-08 (×6): 25 ug via ORAL
  Filled 2023-09-03 (×6): qty 1

## 2023-09-03 MED ORDER — LIDOCAINE 5 % EX PTCH
1.0000 | MEDICATED_PATCH | CUTANEOUS | Status: DC
  Administered 2023-09-03 – 2023-09-08 (×5): 1 via TRANSDERMAL
  Filled 2023-09-03 (×6): qty 1

## 2023-09-03 MED ORDER — AMIODARONE HCL 200 MG PO TABS
200.0000 mg | ORAL_TABLET | Freq: Every day | ORAL | Status: DC
  Administered 2023-09-03 – 2023-09-08 (×6): 200 mg via ORAL
  Filled 2023-09-03 (×6): qty 1

## 2023-09-03 MED ORDER — BOOST / RESOURCE BREEZE PO LIQD CUSTOM
1.0000 | Freq: Three times a day (TID) | ORAL | Status: DC
  Administered 2023-09-03 – 2023-09-08 (×13): 1 via ORAL
  Filled 2023-09-03: qty 1

## 2023-09-03 MED ORDER — GABAPENTIN 300 MG PO CAPS
300.0000 mg | ORAL_CAPSULE | ORAL | Status: DC
  Administered 2023-09-05 – 2023-09-07 (×2): 300 mg via ORAL
  Filled 2023-09-03 (×2): qty 1

## 2023-09-03 NOTE — Assessment & Plan Note (Deleted)
Pt was noted to have elevated white blood cell count on admission. It is possible this is a reactive process secondary to pancreatitis. Appropriate to follow-up outpatient. - Outpatient follow-up

## 2023-09-03 NOTE — Assessment & Plan Note (Deleted)
Pt is anticoagulated on Eliquis. Continue.  - eliquis 2.5 mg BID

## 2023-09-03 NOTE — Assessment & Plan Note (Signed)
Patient noted to have Hgb 8.2, previously 10.7 2 weeks ago. MCV 97.6 suggest concerns for bleeding vs chronic disease as etiology. Patient chronically low and tends to reside around 8-9. Will CTM -CTM -Consider FOBT -AM CBC

## 2023-09-03 NOTE — Assessment & Plan Note (Deleted)
Pt had acute abdominal pain that began 9/26 and has gradually been getting worse. It radiates to her back and is severe. Serum lipase came back at 534. CT angiogram Abd/Pelv obtained didn'tGiven abdominal pain and lipase, most concerning for pancreatitis. Pt does not have history of pancreatitis. Morphine was ordered in ED for pain control, her pain is well controlled for now. Will switch pain medications when she needs additional support due to her ESRD status. Underlying etiology of this pancreatitis is unclear, but is not due to alcohol (she does not drink), cholecystitis (since she had a cholecystectomy), and does not appear to be associated with any hepatic injury. The cause  most likely related to medication.  - Admitted to [facility]MT[facility], progressive care. Attending physician Dr Pearlean Brownie - Vitals Q4h - Continuous pulse ox - LR will be started at 88 ml / hour for 8 hours  - Consider fentanyl or dilaudid when she needs additional IV pain control - Advancing diet as tolerated

## 2023-09-03 NOTE — Assessment & Plan Note (Signed)
Patient noted to have Hgb 8.2, previously 10.7 2 weeks ago. MCV 97.6 suggest concerns for bleeding vs chronic disease as etiology. Patient chronically low and tends to reside around 8-9. On repeat CBC, she was found to be at 8.4. - Follow-up outpatient

## 2023-09-03 NOTE — Evaluation (Addendum)
Physical Therapy Evaluation Patient Details Name: BRANDILEE HANKO MRN: [id number] DOB: Sep 08, 1951 Today's Date: 09/03/2023  History of Present Illness  Pt admitted 9/27 with acute pancreatitis. 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 PTA pt resided at [facility]. Pt is a poor historian, but reports transferring to/from w/c via SPT with staff assist.  Pt currently with functional limitations due to the deficits listed below (see PT Problem List). On eval, pt required min assist bed mobility, and demo fair sitting balance. Pt declining OOB. Pt will benefit from acute skilled PT to increase their independence and safety with mobility to allow discharge. Upon d/c, recommend return to [facility] at previous level of care.          If plan is discharge home, recommend the following:     Can travel by private vehicle   No    Equipment Recommendations None recommended by PT  Recommendations for Other Services       Functional Status Assessment       Precautions / Restrictions Precautions Precautions: Fall      Mobility  Bed Mobility Overal bed mobility: Needs Assistance Bed Mobility: Sidelying to Sit, Sit to Sidelying   Sidelying to sit: Min assist, HOB elevated, Used rails     Sit to sidelying: Min assist, HOB elevated, Used rails General bed mobility comments: min assist to transition to EOB from L sidelying. HOB at approx 30 degrees.    Transfers                   General transfer comment: Pt declining OOB    Ambulation/Gait                  Stairs            Wheelchair Mobility     Tilt Bed    Modified Rankin (Stroke Patients Only)       Balance Overall balance assessment: Needs assistance Sitting-balance support: Bilateral upper extremity supported, Feet  unsupported Sitting balance-Leahy Scale: Fair                                       Pertinent Vitals/Pain Pain Assessment Pain Assessment: Faces Faces Pain Scale: Hurts a little bit Pain Location: back Pain Descriptors / Indicators: Discomfort Pain Intervention(s): Monitored during session, Repositioned    Home Living Family/patient expects to be discharged to:: Skilled nursing facility                   Additional Comments: from [facility]    Prior Function Prior Level of Function : Patient poor historian/Family not available;Needs assist             Mobility Comments: Pt reports being active with PT at [facility]. She reports staff assists with SPT into w/c. She reports going to HD in w/c.       Extremity/Trunk Assessment   Upper Extremity Assessment Upper Extremity Assessment: Generalized weakness    Lower Extremity Assessment Lower Extremity Assessment: Generalized weakness    Cervical / Trunk Assessment Cervical / Trunk Assessment: Kyphotic  Communication   Communication Communication: No apparent difficulties  Cognition Arousal: Alert Behavior During Therapy: Agitated, Flat affect Overall Cognitive Status: No family/caregiver present to  determine baseline cognitive functioning                                 General Comments: Able to state the month and year. Was unaware she was in the hospital. Tangential. Easily distracted. Pt stating "you woke me up, and now you are making me have to think."        General Comments      Exercises     Assessment/Plan    PT Assessment Patient needs continued PT services  PT Problem List Decreased strength;Decreased balance;Decreased cognition;Pain;Decreased mobility;Decreased activity tolerance       PT Treatment Interventions Functional mobility training;Balance training;Patient/family education;Therapeutic activities;Therapeutic exercise    PT Goals (Current goals  can be found in the Care Plan section)  Acute Rehab PT Goals Patient Stated Goal: return to [facility] PT Goal Formulation: Patient unable to participate in goal setting    Frequency Min 1X/week     Co-evaluation               AM-PAC PT "6 Clicks" Mobility  Outcome Measure Help needed turning from your back to your side while in a flat bed without using bedrails?: A Little Help needed moving from lying on your back to sitting on the side of a flat bed without using bedrails?: A Little Help needed moving to and from a bed to a chair (including a wheelchair)?: Total Help needed standing up from a chair using your arms (e.g., wheelchair or bedside chair)?: Total Help needed to walk in hospital room?: Total Help needed climbing 3-5 steps with a railing? : Total 6 Click Score: 10    End of Session   Activity Tolerance: Patient tolerated treatment well Patient left: in bed;with call bell/phone within reach;with bed alarm set Nurse Communication: Mobility status PT Visit Diagnosis: Muscle weakness (generalized) (M62.81);Other abnormalities of gait and mobility (R26.89);Pain    Time: 0981-1914 PT Time Calculation (min) (ACUTE ONLY): 12 min   Charges:   PT Evaluation $PT Eval Low Complexity: 1 Low   PT General Charges $$ ACUTE PT VISIT: 1 Visit         Ferd Glassing., PT  Office # [phone]   Ilda Foil 09/03/2023, 2:56 PM

## 2023-09-03 NOTE — Plan of Care (Signed)
  Problem: Health Behavior/Discharge Planning: Goal: Ability to manage health-related needs will improve Outcome: Not Progressing   Problem: Clinical Measurements: Goal: Ability to maintain clinical measurements within normal limits will improve Outcome: Not Progressing Goal: Will remain free from infection Outcome: Not Progressing  Patient is confused  

## 2023-09-03 NOTE — Discharge Summary (Incomplete)
Family Medicine Teaching [facility] Discharge Summary  Patient name: Diamond [name] Medical record number: [id number] Date of birth: 1951-11-16 Age: 72 y.o. Gender: female Date of Admission: 09/02/2023  Date of Discharge: 09/03/2023 Admitting Physician: Margaretmary Dys, MD  Primary Care Provider: [facility], [facility] Consultants: Nephrology  Indication for Hospitalization: Pancreatitis  Brief Hospital Course:  Pancreatitis Pt had acute abdominal pain that began 9/26 and has gradually been getting worse. It radiates to her back and is severe. Serum lipase came back at 534. CT angiogram Abd/Pelv obtained didn'tGiven abdominal pain and lipase, most concerning for pancreatitis. Pt does not have history of pancreatitis. Morphine was ordered in ED for pain control, her pain is well controlled for now. Will switch pain medications when she needs additional support due to her ESRD status. Underlying etiology of this pancreatitis is unclear, but is not due to alcohol (she does not drink), cholecystitis (since she had a cholecystectomy), and does not appear to be associated with any hepatic injury. The cause is most likely related to medication.  - Consider fentanyl or dilaudid when she needs additional IV pain control - Advance diet as her pancreatitis tolerates  ESRD on hemodialysis (HCC) Pt has history of ESRD (MWF schedule) but was too ill to make her dialysis appointment  on 9/27. - Nephrology aware, will schedule her for 9/28 - Avoiding nephrotoxic medications - Dosing sevelemer per pharmacy recommendations, appreciate their assistance  Elevated white blood cell count, unspecified Pt was noted to have elevated white blood cell count on admission. It is possible this is a reactive process secondary to pancreatitis. Appropriate to follow-up outpatient. - Outpatient follow-up  Hyperkalemia K 5.8 on admission her potassium, likely 2/2 missed HD  session on 9/27. The ED administered lokelma, insulin/dextrose, and albuterol nebulizer - Repeated K and BMP this morning showed K of 4.6 and 4.9   Anemia, unspecified Patient noted to have Hgb 8.2, previously 10.7 2 weeks ago. MCV 97.6 suggest concerns for bleeding vs chronic disease as etiology. Patient chronically low and tends to reside around 8-9. On repeat CBC, she was found to be at 8.4. - Follow-up outpatient   Lung nodule seen on imaging study Upon admission, she was given a CT angiogram, which showed a right lower lobe focal consolidation that was recommended for follow-up after her acute inflammatory process was resolved.  - Follow-up with CT or further evaluation with PET after resolution of acute inflammatory process recommended..   Chronic and Stable Problems:  Hypertension: Metoprolol tartate 50 mg, Hydralizine 50 mg (for SBP>170) Hypothyroidism: levothyroxine 25 PAF: Eliquis 2.5 mg BID, metoprolol 50 mg, Amiodarone 200 mg Asthma: albuterol, breo ellipta Chronic pain: gabapentin 300 mg on dialysis days, lidocaine patches, menthol analgesic gel  Issues for Follow-up with PCP: 1. Follow up on CT Angiogram possible lung mass, consider dedicated CT or PET 2. Evaluate whether any of the patient's medications can be eliminated that might contribute to pancreatitis.  Disposition: Home to SNF  Discharge Condition: Patient was hemodynamically stable and returned to her baseline  Discharge Exam:  General: Patient is a chronically ill-appearing woman who appears older than her stated age. She was lying wrapped up tightly in blankets. She was more cooperative and less irritable this morning. Cardiovascular: No murmurs, rubs, or gallops appreciated. Pt hr was not elevated. Respiratory: CTAB all lung fields. Difficult to auscultate lower lung fields due to patient's chronic back pain. Abdomen: Soft, Nontender to palpation. Bowel sounds hyperactive. Extremities:  Moving all extremities  freely, no clubbing, cyanosis or edema.  Significant Procedures: Dialysis to make up for missed dialysis on 9/27  Significant Labs and Imaging:  Recent Labs  Lab 09/02/23 1256 09/03/23 0356  WBC 5.0 5.4  HGB 8.2* 8.4*  HCT 24.1* 25.2*  PLT 172 168   Recent Labs  Lab 09/02/23 1256 09/02/23 2202 09/03/23 0025 09/03/23 0356  NA 135  --   --  133*  K 5.8*   < > 4.6 4.9  CL 93*  --   --  95*  CO2 24  --   --  25  GLUCOSE 80  --   --  108*  BUN 65*  --   --  69*  CREATININE 8.43*  --   --  9.42*  CALCIUM 9.4  --   --  9.2  MG 2.3  --   --   --   ALKPHOS 85  --   --   --   AST 24  --   --   --   ALT 19  --   --   --   ALBUMIN 2.8*  --   --   --    < > = values in this interval not displayed.   Imaging/Diagnostic Tests: CT Angiogram Chest/Abd/Pelvis 9/28: IMPRESSION: 1. No aortic aneurysm or dissection. 2. 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 Further evaluation with MRI without and with contrast is recommended. 3. Clusters of ground-glass nodularity involving the right upper lobe most consistent with developing infiltrate. 4. Right lower lobe pleural based consolidation may represent scarring. Underlying mass is not excluded. Close follow-up with CT or further evaluation with PET after resolution of acute inflammatory process recommended. 5. No bowel obstruction. Normal appendix. 6.  Aortic Atherosclerosis (ICD10-I70.0).   Results/Tests Pending at Time of Discharge: None  Discharge Medications:  Allergies as of 09/03/2023       Reactions   Shellfish-derived Products Other (See Comments)   Shrimp (diagnostic) Other (See Comments)   Unknown reaction   Other Swelling, Rash   Anesthesia - facial swelling, rash     Med Rec must be completed prior to using this [facility]***     Discharge Instructions: Please refer to Patient Instructions section of EMR for full details.   Patient was counseled important signs and symptoms that should prompt return to medical care, changes in medications, dietary instructions, activity restrictions, and follow up appointments.   Follow-Up Appointments:   Margaretmary Dys, MD 09/03/2023, 8:05 AM PGY-1, [facility]

## 2023-09-03 NOTE — Hospital Course (Addendum)
Pancreatitis Pt had acute abdominal pain that began 9/26 and has gradually been getting worse. It radiates to her back and is severe. Serum lipase came back at 534. CT angiogram Abd/Pelv obtained didn'tGiven abdominal pain and lipase, most concerning for pancreatitis. Pt does not have history of pancreatitis. Morphine was ordered in ED for pain control, her pain is well controlled for now. Will switch pain medications when she needs additional support due to her ESRD status. Underlying etiology of this pancreatitis is unclear, but is not due to alcohol (she does not drink), cholecystitis (since she had a cholecystectomy), and does not appear to be associated with any hepatic injury. The cause is most likely related to medication.  - Consider fentanyl or dilaudid when she needs additional IV pain control - Advance diet as her pancreatitis tolerates  ESRD on hemodialysis (HCC) Pt has history of ESRD (MWF schedule) but was too ill to make her dialysis appointment  on 9/27. - Nephrology aware, will schedule her for 9/28 - Avoiding nephrotoxic medications - Dosing sevelemer per pharmacy recommendations, appreciate their assistance  Elevated white blood cell count, unspecified Pt was noted to have elevated white blood cell count on admission. It is possible this is a reactive process secondary to pancreatitis. Appropriate to follow-up outpatient. - Outpatient follow-up  Hyperkalemia K 5.8 on admission her potassium, likely 2/2 missed HD session on 9/27. The ED administered lokelma, insulin/dextrose, and albuterol nebulizer - Repeated K and BMP this morning showed K of 4.6 and 4.9   Anemia, unspecified Patient noted to have Hgb 8.2, previously 10.7 2 weeks ago. MCV 97.6 suggest concerns for bleeding vs chronic disease as etiology. Patient chronically low and tends to reside around 8-9. On repeat CBC, she was found to be at 8.4. - Follow-up outpatient   Lung nodule seen on imaging study Upon  admission, she was given a CT angiogram, which showed a right lower lobe focal consolidation that was recommended for follow-up after her acute inflammatory process was resolved.  - Follow-up with CT or further evaluation with PET after resolution of acute inflammatory process recommended..   Chronic and Stable Problems:  Hypertension: Metoprolol tartate 50 mg, Hydralizine 50 mg (for SBP>170) Hypothyroidism: levothyroxine 25 PAF: Eliquis 2.5 mg BID, metoprolol 50 mg, Amiodarone 200 mg Asthma: albuterol, breo ellipta Chronic pain: gabapentin 300 mg on dialysis days, lidocaine patches, menthol analgesic gel  Issues for Follow-up with PCP: 1. Follow up on CT Angiogram possible lung mass, consider dedicated CT or PET 2. Evaluate whether any of the patient's medications can be eliminated that might contribute to pancreatitis. 3. Please review meds, especially antibiotics. Minimize risk of pancreatitis. 4. FU with nephrologist

## 2023-09-03 NOTE — Assessment & Plan Note (Signed)
K 5.8 on admission her potassium, likely 2/2 missed HD session on 9/27. The ED administered lokelma, insulin/dextrose, and albuterol nebulizer - Repeated K and BMP this morning showed K of 4.6 and 4.9

## 2023-09-03 NOTE — Consult Note (Addendum)
[facility] Renal Consultation Note  Indication for Consultation:  Management of ESRD/hemodialysis; anemia, hypertension/volume and secondary hyperparathyroidism  HPI: Diamond [name] is a 72 y.o. female medical history significant for ESRD, HD MWF, recurrent pancreatitis (no alcohol use )hypertension, atrial fibrillation on Eliquis, bronchial asthma, and other problems as noted below who was admitted with acute recurrent pancreatitis .  She missed HD yesterday because of symptoms potassium 5 .8, was treated in ER with meds, improved 4 6 this morning. This a.m. reports all symptoms of pancreatitis resolved.  We are consulted for hemodialysis yesterday and ESRD management will plan for dialysis today.      Past Medical History:  Diagnosis Date   Anemia    Asthma    Atrial flutter with rapid ventricular response (HCC) 12/25/2021   Bacteremia    ESRD (end stage renal disease) (HCC)    dialysis Tues Thurs Sat   Gout    Hepatitis C    Hypertension    PAF (paroxysmal atrial fibrillation) (HCC)    RBBB     Past Surgical History:  Procedure Laterality Date   AV FISTULA PLACEMENT Left 12/23/2021   Procedure: LEFT ARM BRACHIOBASILIC VEIN ARTERIOVENOUS (AV) FISTULA CREATION;  Surgeon: Leonie Douglas, MD;  Location: [facility] OR;  Service: Vascular;  Laterality: Left;   BASCILIC VEIN TRANSPOSITION Left 03/08/2022   Procedure: LEFT SECOND STAGE BASILIC VEIN TRANSPOSITION;  Surgeon: Leonie Douglas, MD;  Location: [facility] OR;  Service: Vascular;  Laterality: Left;   BIOPSY  01/19/2022   Procedure: BIOPSY;  Surgeon: Shellia Cleverly, DO;  Location: [facility] ENDOSCOPY;  Service: Gastroenterology;;   CESAREAN SECTION  1989   CHOLECYSTECTOMY N/A 06/25/2022   Procedure: OPEN CHOLECYSTECTOMY;  Surgeon: Fritzi Mandes, MD;  Location: [facility] OR;  Service: General;  Laterality: N/A;   COLONOSCOPY Left 01/19/2022   Procedure: COLONOSCOPY;  Surgeon: Shellia Cleverly, DO;  Location: [facility] ENDOSCOPY;  Service:  Gastroenterology;  Laterality: Left;   ESOPHAGOGASTRODUODENOSCOPY Left 01/19/2022   Procedure: ESOPHAGOGASTRODUODENOSCOPY (EGD);  Surgeon: Shellia Cleverly, DO;  Location: [facility] ENDOSCOPY;  Service: Gastroenterology;  Laterality: Left;   INSERTION OF DIALYSIS CATHETER Right 12/23/2021   Procedure: INSERTION OF DIALYSIS CATHETER;  Surgeon: Leonie Douglas, MD;  Location: [facility] OR;  Service: Vascular;  Laterality: Right;   LAPAROSCOPY N/A 06/25/2022   Procedure: STAGING LAPAROSCOPY;  Surgeon: Fritzi Mandes, MD;  Location: [facility] OR;  Service: General;  Laterality: N/A;   OPEN PARTIAL HEPATECTOMY  N/A 06/25/2022   Procedure: OPEN PARTIAL HEPATECTOMY;  Surgeon: Fritzi Mandes, MD;  Location: [facility] OR;  Service: General;  Laterality: N/A;   POLYPECTOMY  01/19/2022   Procedure: POLYPECTOMY;  Surgeon: Shellia Cleverly, DO;  Location: [facility] ENDOSCOPY;  Service: Gastroenterology;;   TEE WITHOUT CARDIOVERSION N/A 12/22/2021   Procedure: TRANSESOPHAGEAL ECHOCARDIOGRAM (TEE);  Surgeon: Jake Bathe, MD;  Location: [facility] ENDOSCOPY;  Service: Cardiovascular;  Laterality: N/A;   TEE WITHOUT CARDIOVERSION N/A 06/17/2022   Procedure: TRANSESOPHAGEAL ECHOCARDIOGRAM (TEE);  Surgeon: Jodelle Red, MD;  Location: [facility] ENDOSCOPY;  Service: Cardiovascular;  Laterality: N/A;   ULTRASOUND GUIDANCE FOR VASCULAR ACCESS  12/23/2021   Procedure: ULTRASOUND GUIDANCE FOR VASCULAR ACCESS;  Surgeon: Leonie Douglas, MD;  Location: [facility] OR;  Service: Vascular;;      Family History  Problem Relation Age of Onset   Hypertension Mother    Hypertension Father    Colon cancer Brother    Lung cancer Brother       reports that  she has never smoked. She has never been exposed to tobacco smoke. She has never used smokeless tobacco. She reports that she does not drink alcohol and does not use drugs.   Allergies  Allergen Reactions   Shellfish-Derived Products Other (See Comments)   Shrimp (Diagnostic) Other (See Comments)    Unknown  reaction   Other Swelling and Rash    Anesthesia - facial swelling, rash    Prior to Admission medications   Medication Sig Start Date End Date Taking? Authorizing Provider  acetaminophen (TYLENOL) 500 MG tablet Take 1,000 mg by mouth every 8 (eight) hours as needed (for back and leg pain). Take 2 tablets (1000mg  total) by mouth every 8 hours as needed for back and leg pain.   Yes [provider]  albuterol (VENTOLIN HFA) 108 (90 Base) MCG/ACT inhaler Inhale 2 puffs into the lungs every 4 (four) hours as needed for wheezing or shortness of breath. 10/06/22  Yes Zadie Rhine, MD  amiodarone (PACERONE) 200 MG tablet Take 1 tablet (200 mg total) by mouth daily. 01/27/23  Yes Dorcas Carrow, MD  apixaban (ELIQUIS) 2.5 MG TABS tablet Take 1 tablet (2.5 mg total) by mouth 2 (two) times daily. 10/06/22 12/01/23 Yes Zadie Rhine, MD  cloNIDine (CATAPRES) 0.1 MG tablet Take 0.1 mg by mouth every 6 (six) hours as needed (SBP > 170).   Yes [provider]  Diaper Rash Products (DESITIN MULTI-PURPOSE HEALING) OINT Apply 1 Application topically 3 (three) times daily as needed (with incontinence to prevent skin breakdown).   Yes [provider]  Emollient Juanell Fairly) LOTN Apply 1 Application topically 3 (three) times daily as needed (dry skin).   Yes [provider]  fluticasone-salmeterol (ADVAIR DISKUS) 250-50 MCG/ACT AEPB Inhale 1 puff into the lungs in the morning and at bedtime. 10/06/22  Yes Zadie Rhine, MD  gabapentin (NEURONTIN) 300 MG capsule Take 300 mg by mouth 3 (three) times a week. Take on Mondays, Wednesdays and Fridays after dialysis.   Yes [provider]  hydrALAZINE (APRESOLINE) 50 MG tablet Take 50 mg by mouth See admin instructions. Take 1 tablet by mouth two times daily on Mon-Wed-Fri, and 1 tablet by mouth three times daily on Tues-Thurs-Sat.   Yes [provider]  HYDROcodone-acetaminophen (NORCO) 7.5-325 MG tablet Take 1 tablet  by mouth 2 (two) times daily.   Yes [provider]  levothyroxine (SYNTHROID) 25 MCG tablet Take 25 mcg by mouth daily before breakfast.   Yes [provider]  lidocaine 4 % Place 1 patch onto the skin daily. Apply for 12 hours to lower back and leave off for 12 hours between patches.   Yes [provider]  liver oil-zinc oxide (DESITIN) 40 % ointment Apply 1 Application topically as needed (to buttocks area to prevent MASD).   Yes [provider]  Menthol, Topical Analgesic, (BIOFREEZE) 4 % GEL Apply 1 Application topically 3 (three) times daily as needed (lower back and knee pain).   Yes [provider]  metoprolol tartrate (LOPRESSOR) 50 MG tablet Take 1 tablet (50 mg total) by mouth 2 (two) times daily. 01/26/23  Yes Dorcas Carrow, MD  Nutritional Supplement LIQD Take 120 mLs by mouth 3 (three) times daily. Sugar free - record amount consumed   Yes [provider]  ondansetron (ZOFRAN) 4 MG tablet Take 4 mg by mouth every 6 (six) hours as needed for nausea or vomiting.   Yes [provider]  pantoprazole (PROTONIX) 40 MG tablet Take 1  tablet (40 mg total) by mouth daily. 10/06/22  Yes Zadie Rhine, MD  sevelamer (RENAGEL) 800 MG tablet Take 800 mg by mouth See admin instructions. Take 2 tablets (1600mg  total) by mouth 3 times a day every Tuesday, Thursday and Saturday. Take 2 tablets (1600mg  total) with meals every Monday, Wednesday, Friday and Sunday.   Yes [provider]      Results for orders placed or performed during the hospital encounter of 09/02/23 (from the past 48 hour(s))  CBC with Differential     Status: Abnormal   Collection Time: 09/02/23 12:56 PM  Result Value Ref Range   WBC 5.0 4.0 - 10.5 K/uL   RBC 2.47 (L) 3.87 - 5.11 MIL/uL   Hemoglobin 8.2 (L) 12.0 - 15.0 g/dL   HCT 16.1 (L) 09.6 - 04.5 %   MCV 97.6 80.0 - 100.0 fL   MCH 33.2 26.0 - 34.0 pg   MCHC 34.0 30.0 - 36.0 g/dL   RDW 40.9 (H) 81.1 -  15.5 %   Platelets 172 150 - 400 K/uL   nRBC 0.0 0.0 - 0.2 %   Neutrophils Relative % 54 %   Neutro Abs 2.7 1.7 - 7.7 K/uL   Lymphocytes Relative 29 %   Lymphs Abs 1.4 0.7 - 4.0 K/uL   Monocytes Relative 14 %   Monocytes Absolute 0.7 0.1 - 1.0 K/uL   Eosinophils Relative 1 %   Eosinophils Absolute 0.1 0.0 - 0.5 K/uL   Basophils Relative 1 %   Basophils Absolute 0.0 0.0 - 0.1 K/uL   Immature Granulocytes 1 %   Abs Immature Granulocytes 0.03 0.00 - 0.07 K/uL    Comment: Performed at [facility], [address] [place], [place] [address]  Comprehensive metabolic panel     Status: Abnormal   Collection Time: 09/02/23 12:56 PM  Result Value Ref Range   Sodium 135 135 - 145 mmol/L   Potassium 5.8 (H) 3.5 - 5.1 mmol/L   Chloride 93 (L) 98 - 111 mmol/L   CO2 24 22 - 32 mmol/L   Glucose, Bld 80 70 - 99 mg/dL    Comment: Glucose reference range applies only to samples taken after fasting for at least 8 hours.   BUN 65 (H) 8 - 23 mg/dL   Creatinine, Ser 2.95 (H) 0.44 - 1.00 mg/dL   Calcium 9.4 8.9 - 62.1 mg/dL   Total Protein 6.5 6.5 - 8.1 g/dL   Albumin 2.8 (L) 3.5 - 5.0 g/dL   AST 24 15 - 41 U/L   ALT 19 0 - 44 U/L   Alkaline Phosphatase 85 38 - 126 U/L   Total Bilirubin 0.8 0.3 - 1.2 mg/dL   GFR, Estimated 5 (L) >60 mL/min    Comment: (NOTE) Calculated using the CKD-EPI Creatinine Equation (2021)    Anion gap 18 (H) 5 - 15    Comment: Performed at [facility], [address], [place], [place] [address]  Troponin I (High Sensitivity)     Status: Abnormal   Collection Time: 09/02/23 12:56 PM  Result Value Ref Range   Troponin I (High Sensitivity) 29 (H) <18 ng/L    Comment: (NOTE) Elevated high sensitivity troponin I (hsTnI) values and significant  changes across serial measurements may suggest ACS but many other  chronic and acute conditions are known to elevate hsTnI results.  Refer to the "Links" section for chest pain algorithms and additional   guidance. Performed at [facility],  [address] [place], [place] [address]   Lipase, blood     Status: Abnormal   Collection Time: 09/02/23 12:56 PM  Result Value Ref Range   Lipase 534 (H) 11 - 51 U/L    Comment: RESULTS CONFIRMED BY MANUAL DILUTION Performed at [facility], [address] [place], [place] [address]   Magnesium     Status: None   Collection Time: 09/02/23 12:56 PM  Result Value Ref Range   Magnesium 2.3 1.7 - 2.4 mg/dL    Comment: Performed at [facility], [address] [place], [place] [address]  Troponin I (High Sensitivity)     Status: Abnormal   Collection Time: 09/02/23  2:26 PM  Result Value Ref Range   Troponin I (High Sensitivity) 29 (H) <18 ng/L    Comment: (NOTE) Elevated high sensitivity troponin I (hsTnI) values and significant  changes across serial measurements may suggest ACS but many other  chronic and acute conditions are known to elevate hsTnI results.  Refer to the "Links" section for chest pain algorithms and additional  guidance. Performed at [facility], [address] [place], [place] [address]   Potassium     Status: Abnormal   Collection Time: 09/02/23 10:02 PM  Result Value Ref Range   Potassium 5.8 (H) 3.5 - 5.1 mmol/L    Comment: Performed at [facility], [address] [place], [place] [address]  Potassium     Status: None   Collection Time: 09/03/23 12:25 AM  Result Value Ref Range   Potassium 4.6 3.5 - 5.1 mmol/L    Comment: Performed at [facility], [address], [place], [place] [address]  CBC     Status: Abnormal   Collection Time: 09/03/23  3:56 AM  Result Value Ref Range   WBC 5.4 4.0 - 10.5 K/uL   RBC 2.56 (L) 3.87 - 5.11 MIL/uL   Hemoglobin 8.4 (L) 12.0 - 15.0 g/dL   HCT 57.8 (L) 46.9 - 62.9 %   MCV 98.4 80.0 - 100.0 fL   MCH 32.8 26.0 - 34.0 pg   MCHC 33.3 30.0 - 36.0 g/dL   RDW 52.8 (H) 41.3 - 24.4 %   Platelets 168 150 - 400 K/uL   nRBC 0.0 0.0 - 0.2 %     Comment: Performed at [facility], [address] [place], [place] [address]  Basic metabolic panel     Status: Abnormal   Collection Time: 09/03/23  3:56 AM  Result Value Ref Range   Sodium 133 (L) 135 - 145 mmol/L   Potassium 4.9 3.5 - 5.1 mmol/L   Chloride 95 (L) 98 - 111 mmol/L   CO2 25 22 - 32 mmol/L   Glucose, Bld 108 (H) 70 - 99 mg/dL    Comment: Glucose reference range applies only to samples taken after fasting for at least 8 hours.   BUN 69 (H) 8 - 23 mg/dL   Creatinine, Ser 2.53 (H) 0.44 - 1.00 mg/dL   Calcium 9.2 8.9 - 66.4 mg/dL   GFR, Estimated 4 (L) >60 mL/min    Comment: (NOTE) Calculated using the CKD-EPI Creatinine Equation (2021)    Anion gap 13 5 - 15    Comment: Performed at [facility], [address] [place], [place] [address]   EKG:  ROS:  As in HPI Physical Exam: Vitals:   09/03/23 0522 09/03/23 0724  BP: 129/74 137/75  Pulse: 66 69  Resp: 12 17  Temp: 98.1 F (36.7 C)   SpO2: 100% 100%     General: Alert pleasant early female NAD HEENT: PERRLA, EOMI, MMM Neck: Supple, no JVD Heart: RRR no MRG Lungs: CTA bilaterally nonlabored room air Abdomen: NABS, soft NTND, no ascites Extremities: No pedal edema Skin: Warm dry no overt rash or ulcers on exposed skin Neuro: Alert O x 3 moves all extremities nonfocal Dialysis Access: Left upper arm AV fistula positive bruit  Dialysis Orders: Center: [facility] on MWF 3.5 hours EDW 51 kg 2K, 2 Ca bath, left upper arm AV fistula, Mircera 200 every 2 weeks not given since 08/15/23 Venofer 50 mg weekly, no heparin no vitamin D    Assessment/Plan ESRD= HD MWF, missed HD yesterday plan for dialysis today then next scheduled on Monday Mild hyperkalemia 5.8 on admission improved with Lokelma and meds HD today Acute recurrent pancreatitis= per admit team, this morning symptoms resolved Hypertension/volume  -no excess volume BP stable for her pain keep even on dialysis continue current home  BP meds Anemia  -Hgb 8.4 dose Aranesp today, plan for next Wednesday Metabolic bone disease -calcium at goal, continue phosphate binders with meals, no vitamin D Lung nodule seen on CT angiogram follow-up as outpatient per admit team Nutrition -renal failure diet as tolerated  Lenny Pastel, PA-C [facility] Beeper [phone] 09/03/2023, 8:10 AM

## 2023-09-03 NOTE — ED Notes (Signed)
Assumed care of patient, NAD noted at this time, pt resting in bed, respirations even and unlabored, skin warm and dry, bed in low position, call light within reach. Comfort measures offered. Will continue to monitor.  Family at bedside.

## 2023-09-03 NOTE — ED Notes (Signed)
ED TO INPATIENT HANDOFF REPORT  ED Nurse Name and Phone #: Randall Hiss [phone]  S Name/Age/Gender Diamond [name] 72 y.o. female Room/Bed: 003C/003C  Code Status   Code Status: Limited: Do not attempt resuscitation (DNR) -DNR-LIMITED -Do Not Intubate/DNI   Home/SNF/Other SNF Patient oriented to: self, place,  Is this baseline? Yes , hx of dementia  Triage Complete: Triage complete  Chief Complaint Pancreatitis [K85.90]  Triage Note Ongoing abdominal and back pain x 2 days. 2 vomiting episodes since last night. Hx of dementia and at baseline mentation. On dialysis MWF. Missed dialysis today. Lives in [facility].     Allergies Allergies  Allergen Reactions   Shellfish-Derived Products Other (See Comments)   Shrimp (Diagnostic) Other (See Comments)    Unknown reaction   Other Swelling and Rash    Anesthesia - facial swelling, rash    Level of Care/Admitting Diagnosis ED Disposition     ED Disposition  Admit   Condition  --   Comment  Hospital Area: [facility] [[id number]]  Level of Care: Progressive [102]  Admit to Progressive based on following criteria: GI, ENDOCRINE disease patients with GI bleeding, acute liver failure or pancreatitis, stable with diabetic ketoacidosis or thyrotoxicosis (hypothyroid) state.  May admit patient to [facility] or [facility] if equivalent level of care is available:: No  Covid Evaluation: Asymptomatic - no recent exposure (last 10 days) testing not required  Diagnosis: Pancreatitis [202663]  Admitting Physician: Margaretmary Dys [[id number]]  Attending Physician: Carney Living [phone]  Certification:: I certify this patient will need inpatient services for at least 2 midnights  Expected Medical Readiness: 09/05/2023          B Medical/Surgery History Past Medical History:  Diagnosis Date   Anemia    Asthma    Atrial flutter with rapid ventricular response (HCC) 12/25/2021    Bacteremia    ESRD (end stage renal disease) (HCC)    dialysis Tues Thurs Sat   Gout    Hepatitis C    Hypertension    PAF (paroxysmal atrial fibrillation) (HCC)    RBBB    Past Surgical History:  Procedure Laterality Date   AV FISTULA PLACEMENT Left 12/23/2021   Procedure: LEFT ARM BRACHIOBASILIC VEIN ARTERIOVENOUS (AV) FISTULA CREATION;  Surgeon: Leonie Douglas, MD;  Location: [facility] OR;  Service: Vascular;  Laterality: Left;   BASCILIC VEIN TRANSPOSITION Left 03/08/2022   Procedure: LEFT SECOND STAGE BASILIC VEIN TRANSPOSITION;  Surgeon: Leonie Douglas, MD;  Location: [facility] OR;  Service: Vascular;  Laterality: Left;   BIOPSY  01/19/2022   Procedure: BIOPSY;  Surgeon: Shellia Cleverly, DO;  Location: [facility] ENDOSCOPY;  Service: Gastroenterology;;   CESAREAN SECTION  1989   CHOLECYSTECTOMY N/A 06/25/2022   Procedure: OPEN CHOLECYSTECTOMY;  Surgeon: Fritzi Mandes, MD;  Location: [facility] OR;  Service: General;  Laterality: N/A;   COLONOSCOPY Left 01/19/2022   Procedure: COLONOSCOPY;  Surgeon: Shellia Cleverly, DO;  Location: [facility] ENDOSCOPY;  Service: Gastroenterology;  Laterality: Left;   ESOPHAGOGASTRODUODENOSCOPY Left 01/19/2022   Procedure: ESOPHAGOGASTRODUODENOSCOPY (EGD);  Surgeon: Shellia Cleverly, DO;  Location: [facility] ENDOSCOPY;  Service: Gastroenterology;  Laterality: Left;   INSERTION OF DIALYSIS CATHETER Right 12/23/2021   Procedure: INSERTION OF DIALYSIS CATHETER;  Surgeon: Leonie Douglas, MD;  Location: [facility] OR;  Service: Vascular;  Laterality: Right;   LAPAROSCOPY N/A 06/25/2022   Procedure: STAGING LAPAROSCOPY;  Surgeon: Fritzi Mandes, MD;  Location: [facility] OR;  Service:  General;  Laterality: N/A;   OPEN PARTIAL HEPATECTOMY  N/A 06/25/2022   Procedure: OPEN PARTIAL HEPATECTOMY;  Surgeon: Fritzi Mandes, MD;  Location: [facility] OR;  Service: General;  Laterality: N/A;   POLYPECTOMY  01/19/2022   Procedure: POLYPECTOMY;  Surgeon: Shellia Cleverly, DO;  Location: [facility] ENDOSCOPY;  Service:  Gastroenterology;;   TEE WITHOUT CARDIOVERSION N/A 12/22/2021   Procedure: TRANSESOPHAGEAL ECHOCARDIOGRAM (TEE);  Surgeon: Jake Bathe, MD;  Location: [facility] ENDOSCOPY;  Service: Cardiovascular;  Laterality: N/A;   TEE WITHOUT CARDIOVERSION N/A 06/17/2022   Procedure: TRANSESOPHAGEAL ECHOCARDIOGRAM (TEE);  Surgeon: Jodelle Red, MD;  Location: [facility] ENDOSCOPY;  Service: Cardiovascular;  Laterality: N/A;   ULTRASOUND GUIDANCE FOR VASCULAR ACCESS  12/23/2021   Procedure: ULTRASOUND GUIDANCE FOR VASCULAR ACCESS;  Surgeon: Leonie Douglas, MD;  Location: [facility] OR;  Service: Vascular;;     A IV Location/Drains/Wounds Patient Lines/Drains/Airways Status     Active Line/Drains/Airways     Name Placement date Placement time Site Days   Peripheral IV 09/02/23 20 G 1.88" Anterior;Right Forearm 09/02/23  1605  Forearm  1   Fistula / Graft Left Forearm Arteriovenous fistula 12/23/21  1646  Forearm  619            Intake/Output Last 24 hours No intake or output data in the 24 hours ending 09/03/23 0453  Labs/Imaging Results for orders placed or performed during the hospital encounter of 09/02/23 (from the past 48 hour(s))  CBC with Differential     Status: Abnormal   Collection Time: 09/02/23 12:56 PM  Result Value Ref Range   WBC 5.0 4.0 - 10.5 K/uL   RBC 2.47 (L) 3.87 - 5.11 MIL/uL   Hemoglobin 8.2 (L) 12.0 - 15.0 g/dL   HCT 41.3 (L) 24.4 - 01.0 %   MCV 97.6 80.0 - 100.0 fL   MCH 33.2 26.0 - 34.0 pg   MCHC 34.0 30.0 - 36.0 g/dL   RDW 27.2 (H) 53.6 - 64.4 %   Platelets 172 150 - 400 K/uL   nRBC 0.0 0.0 - 0.2 %   Neutrophils Relative % 54 %   Neutro Abs 2.7 1.7 - 7.7 K/uL   Lymphocytes Relative 29 %   Lymphs Abs 1.4 0.7 - 4.0 K/uL   Monocytes Relative 14 %   Monocytes Absolute 0.7 0.1 - 1.0 K/uL   Eosinophils Relative 1 %   Eosinophils Absolute 0.1 0.0 - 0.5 K/uL   Basophils Relative 1 %   Basophils Absolute 0.0 0.0 - 0.1 K/uL   Immature Granulocytes 1 %   Abs Immature  Granulocytes 0.03 0.00 - 0.07 K/uL    Comment: Performed at [facility], [address] [place], [place] [address]  Comprehensive metabolic panel     Status: Abnormal   Collection Time: 09/02/23 12:56 PM  Result Value Ref Range   Sodium 135 135 - 145 mmol/L   Potassium 5.8 (H) 3.5 - 5.1 mmol/L   Chloride 93 (L) 98 - 111 mmol/L   CO2 24 22 - 32 mmol/L   Glucose, Bld 80 70 - 99 mg/dL    Comment: Glucose reference range applies only to samples taken after fasting for at least 8 hours.   BUN 65 (H) 8 - 23 mg/dL   Creatinine, Ser 2.59 (H) 0.44 - 1.00 mg/dL   Calcium 9.4 8.9 - 56.3 mg/dL   Total Protein 6.5 6.5 - 8.1 g/dL   Albumin 2.8 (L) 3.5 - 5.0 g/dL   AST 24 15 -  41 U/L   ALT 19 0 - 44 U/L   Alkaline Phosphatase 85 38 - 126 U/L   Total Bilirubin 0.8 0.3 - 1.2 mg/dL   GFR, Estimated 5 (L) >60 mL/min    Comment: (NOTE) Calculated using the CKD-EPI Creatinine Equation (2021)    Anion gap 18 (H) 5 - 15    Comment: Performed at [facility], [address], [place], [place] [address]  Troponin I (High Sensitivity)     Status: Abnormal   Collection Time: 09/02/23 12:56 PM  Result Value Ref Range   Troponin I (High Sensitivity) 29 (H) <18 ng/L    Comment: (NOTE) Elevated high sensitivity troponin I (hsTnI) values and significant  changes across serial measurements may suggest ACS but many other  chronic and acute conditions are known to elevate hsTnI results.  Refer to the "Links" section for chest pain algorithms and additional  guidance. Performed at [facility], [address], [place], [place] [address]   Lipase, blood     Status: Abnormal   Collection Time: 09/02/23 12:56 PM  Result Value Ref Range   Lipase 534 (H) 11 - 51 U/L    Comment: RESULTS CONFIRMED BY MANUAL DILUTION Performed at [facility], [address] [place], [place] [address]   Magnesium     Status: None   Collection Time: 09/02/23 12:56 PM  Result Value Ref Range   Magnesium  2.3 1.7 - 2.4 mg/dL    Comment: Performed at [facility], [address] [place], [place] [address]  Troponin I (High Sensitivity)     Status: Abnormal   Collection Time: 09/02/23  2:26 PM  Result Value Ref Range   Troponin I (High Sensitivity) 29 (H) <18 ng/L    Comment: (NOTE) Elevated high sensitivity troponin I (hsTnI) values and significant  changes across serial measurements may suggest ACS but many other  chronic and acute conditions are known to elevate hsTnI results.  Refer to the "Links" section for chest pain algorithms and additional  guidance. Performed at [facility], [address], [place], [place] [address]   Potassium     Status: Abnormal   Collection Time: 09/02/23 10:02 PM  Result Value Ref Range   Potassium 5.8 (H) 3.5 - 5.1 mmol/L    Comment: Performed at [facility], [address] [place], [place] [address]  Potassium     Status: None   Collection Time: 09/03/23 12:25 AM  Result Value Ref Range   Potassium 4.6 3.5 - 5.1 mmol/L    Comment: Performed at [facility], [address], [place], [place] [address]  CBC     Status: Abnormal   Collection Time: 09/03/23  3:56 AM  Result Value Ref Range   WBC 5.4 4.0 - 10.5 K/uL   RBC 2.56 (L) 3.87 - 5.11 MIL/uL   Hemoglobin 8.4 (L) 12.0 - 15.0 g/dL   HCT 44.0 (L) 34.7 - 42.5 %   MCV 98.4 80.0 - 100.0 fL   MCH 32.8 26.0 - 34.0 pg   MCHC 33.3 30.0 - 36.0 g/dL   RDW 95.6 (H) 38.7 - 56.4 %   Platelets 168 150 - 400 K/uL   nRBC 0.0 0.0 - 0.2 %    Comment: Performed at [facility], [address], [place], [place] [address]  Basic metabolic panel     Status: Abnormal   Collection Time: 09/03/23  3:56 AM  Result Value Ref Range   Sodium 133 (L) 135 - 145 mmol/L   Potassium 4.9 3.5 - 5.1 mmol/L   Chloride 95 (L) 98 - 111 mmol/L   CO2 25 22 - 32 mmol/L   Glucose, Bld 108 (H) 70 - 99 mg/dL    Comment: Glucose reference range applies only to samples taken after fasting for at least  8 hours.   BUN 69 (H) 8 - 23 mg/dL   Creatinine, Ser 6.96 (H) 0.44 - 1.00 mg/dL   Calcium 9.2 8.9 - 29.5 mg/dL   GFR, Estimated 4 (L) >60 mL/min    Comment: (NOTE) Calculated using the CKD-EPI Creatinine Equation (2021)    Anion gap 13 5 - 15    Comment: Performed at [facility], [address], [place], [place] [address]   CT Angio Chest/Abd/Pel for Dissection W and/or Wo Contrast  Result Date: 09/02/2023 CLINICAL DATA:  Abdominal pain.  Concern for aortic aneurysm. EXAM: CT ANGIOGRAPHY CHEST, ABDOMEN AND PELVIS TECHNIQUE: Non-contrast CT of the chest was initially obtained. Multidetector CT imaging through the chest, abdomen and pelvis was performed using the standard protocol during bolus administration of intravenous contrast. Multiplanar reconstructed images and MIPs were obtained and reviewed to evaluate the vascular anatomy. RADIATION DOSE REDUCTION: This exam was performed according to the departmental dose-optimization program which includes automated exposure control, adjustment of the mA and/or kV according to patient size and/or use of iterative reconstruction technique. CONTRAST:  75mL OMNIPAQUE IOHEXOL 350 MG/ML SOLN COMPARISON:  CT abdomen pelvis dated 07/07/2022. chest CT dated 06/15/2022. FINDINGS: CTA CHEST FINDINGS Cardiovascular: There is no cardiomegaly or pericardial effusion. Coronary vascular calcification of the LAD and left circumflex artery. There is mild atherosclerotic calcification of the thoracic aorta. No aneurysmal dilatation or dissection. The origins of the great vessels of the aortic arch appear patent. Mild dilatation and irregularity of the left subclavian vein and left upper extremity veins secondary to AV fistula. The central pulmonary arteries appear patent. Mediastinum/Nodes: No hilar or mediastinal adenopathy. The esophagus is grossly unremarkable. No mediastinal fluid collection. Lungs/Pleura: Clusters of ground-glass nodularity involving the right  upper lobe most consistent with developing infiltrate. There is a triangular pleural based area of consolidation in the right lower lobe, new since the CT of 06/15/2022, which may represent scarring. Underlying mass is not excluded. Close follow-up with CT or further evaluation with PET after resolution of acute inflammatory process recommended. Similar appearance of an 8 mm right middle lobe and a 5 mm left lower lobe nodules. There is no pleural effusion or pneumothorax. The central airways are patent. Musculoskeletal: Osteopenia with degenerative changes of the spine. Left breast calcification. No acute osseous pathology. Review of the MIP images confirms the above findings. CTA ABDOMEN AND PELVIS FINDINGS VASCULAR Aorta: Moderate atherosclerotic calcification. No aneurysmal dilatation or dissection. The aorta is tortuous. Celiac: Atherosclerotic calcification of the origin of the celiac axis. The celiac artery and its major branches are patent. SMA: The SMA is patent. Renals: The renal arteries appear patent. IMA: The IMA is patent. Inflow: Moderate atherosclerotic calcification the iliac arteries. The iliac arteries are patent. No aneurysm or dissection. Veins: No obvious venous abnormality within the limitations of this arterial phase study. Review of the MIP images confirms the above findings. NON-VASCULAR No intra-abdominal free air or free fluid. Hepatobiliary: The liver is unremarkable. Cholecystectomy. Mild biliary ductal dilatation likely post cholecystectomy. The common bile duct measures approximately 1 cm in diameter. No calcified stone noted in the  central CBD. Pancreas: There is mild dilatation of the main pancreatic duct similar to prior CT. No active inflammatory changes. Spleen: Normal in size without focal abnormality. Adrenals/Urinary Tract: The adrenal glands unremarkable. Moderate bilateral renal parenchyma atrophy. Multiple bilateral renal cysts and additional smaller hypodense lesions are  not characterized on this CT. There is no hydronephrosis on either side. The visualized ureters and urinary bladder appear unremarkable. Stomach/Bowel: Moderate stool throughout the colon. There is no bowel obstruction or active inflammation. The appendix is normal. Lymphatic: No adenopathy. Reproductive: The uterus is grossly unremarkable. No adnexal masses. Other: None Musculoskeletal: Osteopenia with degenerative changes. There is severe fragmentation of the L3 with approximately 40% loss of vertebral body height. There is a 7 mm retropulsed fragment of the superior posterior cortex causing moderate focal narrowing of the central canal. Additional areas of fragmentation involving the inferior endplate of L2. Findings concerning for spondylo-discitis. Further evaluation with MRI without and with contrast is recommended. Review of the MIP images confirms the above findings. IMPRESSION: 1. No aortic aneurysm or dissection. 2. 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 Further evaluation with MRI without and with contrast is recommended. 3. Clusters of ground-glass nodularity involving the right upper lobe most consistent with developing infiltrate. 4. Right lower lobe pleural based consolidation may represent scarring. Underlying mass is not excluded. Close follow-up with CT or further evaluation with PET after resolution of acute inflammatory process recommended. 5. No bowel obstruction. Normal appendix. 6.  Aortic Atherosclerosis (ICD10-I70.0). Electronically Signed   By: Elgie Collard M.D.   On: 09/02/2023 19:24    Pending Labs Unresulted Labs (From admission, onward)    None       Vitals/Pain Today's Vitals   09/02/23 2315 09/02/23 2330 09/02/23 2345 09/03/23 0345  BP: 137/78 121/69 127/70 119/66  Pulse: 77 85 86 71  Resp: 12 12 12 13   Temp:    98.1 F (36.7 C)  TempSrc:    Oral  SpO2: 100% 100% 100% 100%   Weight:      Height:      PainSc:   0-No pain     Isolation Precautions No active isolations  Medications Medications  sodium zirconium cyclosilicate (LOKELMA) packet 10 g (10 g Oral Given 09/02/23 2256)  amiodarone (PACERONE) tablet 200 mg (has no administration in time range)  cloNIDine (CATAPRES) tablet 0.1 mg (has no administration in time range)  hydrALAZINE (APRESOLINE) tablet 50 mg (has no administration in time range)  metoprolol tartrate (LOPRESSOR) tablet 50 mg (50 mg Oral Not Given 09/03/23 0311)  levothyroxine (SYNTHROID) tablet 25 mcg (has no administration in time range)  ondansetron (ZOFRAN) tablet 4 mg (has no administration in time range)  pantoprazole (PROTONIX) EC tablet 40 mg (has no administration in time range)  apixaban (ELIQUIS) tablet 2.5 mg (2.5 mg Oral Not Given 09/03/23 0309)  gabapentin (NEURONTIN) capsule 300 mg (has no administration in time range)  lidocaine (LIDODERM) 5 % 1 patch (has no administration in time range)  albuterol (PROVENTIL) (2.5 MG/3ML) 0.083% nebulizer solution 3 mL (has no administration in time range)  fluticasone furoate-vilanterol (BREO ELLIPTA) 200-25 MCG/ACT 1 puff (has no administration in time range)  lactated ringers infusion ( Intravenous New Bag/Given 09/03/23 0318)  liver oil-zinc oxide (DESITIN) 40 % ointment (has no administration in time range)  camphor-menthol (SARNA) lotion (has no administration in time range)  morphine (PF) 2 MG/ML injection 2 mg (2 mg Intravenous Given 09/02/23  1615)  iohexol (OMNIPAQUE) 350 MG/ML injection 75 mL (75 mLs Intravenous Contrast Given 09/02/23 1804)  albuterol (PROVENTIL) (2.5 MG/3ML) 0.083% nebulizer solution 10 mg (10 mg Nebulization Given 09/02/23 2227)  insulin aspart (novoLOG) injection 5 Units (5 Units Intravenous Given 09/02/23 2256)    And  dextrose 50 % solution 50 mL (50 mLs Intravenous Given 09/02/23 2256)  hydrALAZINE (APRESOLINE) injection 10 mg (10 mg Intravenous Given 09/02/23  2307)    Mobility walks with person assist     Focused Assessments Renal Assessment Handoff:  Hemodialysis Schedule: Hemodialysis Schedule: Monday/Wednesday/Friday Last Hemodialysis date and time: 09/02/23   Restricted appendage: left arm   R Recommendations: See Admitting Provider Note  Report given to:   Additional Notes: lives in [place]

## 2023-09-03 NOTE — Assessment & Plan Note (Deleted)
Pt has history of ESRD (MWF schedule) but was too ill to make her dialysis appointment  on 9/27. - Nephrology aware, will schedule her for dialysis on 9/28 - Avoiding nephrotoxic medications - Dosing sevelemer per pharmacy recommendations, appreciate their assistance

## 2023-09-03 NOTE — Assessment & Plan Note (Signed)
Upon admission, she was given a CT angiogram, which showed a right lower lobe focal consolidation that was recommended for follow-up after her acute inflammatory process was resolved.  - Follow-up with CT or further evaluation with PET after resolution of acute inflammatory process recommended.Marland Kitchen

## 2023-09-03 NOTE — Progress Notes (Incomplete)
Daily Progress Note Intern Pager: [phone]  Patient name: Diamond [name] Medical record number: [id number] Date of birth: [birth year] Age: 72 y.o. Gender: female  Primary Care Provider: [facility], [facility] Consultants: Nephrology Code Status: DNR-DNI  Pt Overview and Major Events to Date:  Diamond [name] is a 72 year old female past medical history significant for end-stage renal disease, atrial fibrillation, hepatitis C, HTN who was admitted 9/28 for two days of severe worsening abdominal pain with nausea and vomiting that radiated to her back. She was found to have an elevated lipase of 539 and was admitted for acute pancreatitis.   Assessment and Plan:  Assessment & Plan Pancreatitis Pt had acute abdominal pain that began 9/26 and has gradually been getting worse. It radiates to her back and is severe. Serum lipase came back at 534. CT angiogram Abd/Pelv obtained didn'tGiven abdominal pain and lipase, most concerning for pancreatitis. Pt does not have history of pancreatitis. Morphine was ordered in ED for pain control, her pain is well controlled for now. Will switch pain medications when she needs additional support due to her ESRD status. Underlying etiology of this pancreatitis is unclear, but is not due to alcohol (she does not drink), cholecystitis (since she had a cholecystectomy), and does not appear to be associated with any hepatic injury. The cause  most likely related to medication.  - Admitted to FMTS, progressive care. Attending physician Dr Pearlean Brownie - Vitals Q4h - Continuous pulse ox - LR will be started at 88 ml / hour for 8 hours  - Consider fentanyl or dilaudid when she needs additional IV pain control - Advancing diet as tolerated ESRD on hemodialysis (HCC) Pt has history of ESRD (MWF schedule) but was too ill to make her dialysis appointment  on 9/27. - Nephrology aware, will schedule her for dialysis on 9/28 - Avoiding  nephrotoxic medications - Dosing sevelemer per pharmacy recommendations, appreciate their assistance Elevated white blood cell count, unspecified Pt was noted to have elevated white blood cell count on admission. It is possible this is a reactive process secondary to pancreatitis. Appropriate to follow-up outpatient. - Outpatient follow-up Hyperkalemia K 5.8 on admission her potassium, likely 2/2 missed HD session on 9/27. The ED administered lokelma, insulin/dextrose, and albuterol nebulizer - Repeated K and BMP this morning showed K of 4.6 and 4.9  Anemia, unspecified Patient noted to have Hgb 8.2, previously 10.7 2 weeks ago. MCV 97.6 suggest concerns for bleeding vs chronic disease as etiology. Patient chronically low and tends to reside around 8-9. On repeat CBC, she was found to be at 8.4. - Follow-up outpatient  Lung nodule seen on imaging study Upon admission, she was given a CT angiogram, which showed a right lower lobe focal consolidation that was recommended for follow-up after her acute inflammatory process was resolved.  - Follow-up with CT or further evaluation with PET after resolution of acute inflammatory process recommended..   Chronic and Stable Problems:  Hypertension: Metoprolol tartate 50 mg, Hydralizine 50 mg (for SBP>170) Hypothyroidism: levothyroxine 25 PAF: Eliquis 2.5 mg BID, metoprolol 50 mg, Amiodarone 200 mg Asthma: albuterol, breo ellipta Chronic pain: gabapentin 300 mg on dialysis days, lidocaine patches, menthol analgesic gel   FEN/GI: Clear liquids, advance as tolerated. VTE Prophylaxis: SCDs Dispo:SNF today. Barriers include receiving dialysis prior to discharge.   Subjective:  Met patient this morning bedside. She reported improvement in her abdominal pain since yesterday, but voiced concern for breakfast and getting her dialysis today.  She denied further nausea/vomiting, shortness of breath, or other problems. She rated her pain in her abdomen as  "zero" this morning, but said she still has her chronic pain in her back and legs.  Objective: Temp:  [97.9 F (36.6 C)-98.2 F (36.8 C)] 98.1 F (36.7 C) (09/28 0522) Pulse Rate:  [55-86] 66 (09/28 0522) Resp:  [10-17] 12 (09/28 0522) BP: (119-198)/(66-111) 129/74 (09/28 0522) SpO2:  [100 %] 100 % (09/28 0522) Weight:  [59 kg] 59 kg (09/27 1211) Physical Exam: General: Patient is a chronically ill-appearing woman who appears older than her stated age. She was lying wrapped up tightly in blankets. She was more cooperative and less irritable this morning. Cardiovascular: No murmurs, rubs, or gallops appreciated. Pt hr was not elevated. Respiratory: CTAB all lung fields. Difficult to auscultate lower lung fields due to patient's chronic back pain. Abdomen: Soft, Nontender to palpation. Bowel sounds hyperactive. Extremities: Moving all extremities freely, no clubbing, cyanosis or edema.  Laboratory: Most recent CBC Lab Results  Component Value Date   WBC 5.4 09/03/2023   HGB 8.4 (L) 09/03/2023   HCT 25.2 (L) 09/03/2023   MCV 98.4 09/03/2023   PLT 168 09/03/2023   Most recent BMP    Latest Ref Rng & Units 09/03/2023    3:56 AM  BMP  Glucose 70 - 99 mg/dL 409   BUN 8 - 23 mg/dL 69   Creatinine 8.11 - 1.00 mg/dL 9.14   Sodium 782 - 956 mmol/L 133   Potassium 3.5 - 5.1 mmol/L 4.9   Chloride 98 - 111 mmol/L 95   CO2 22 - 32 mmol/L 25   Calcium 8.9 - 10.3 mg/dL 9.2     Other pertinent labs   Latest Reference Range & Units 09/02/23 22:02 09/03/23 00:25 09/03/23 03:56  Potassium 3.5 - 5.1 mmol/L 5.8 (H) 4.6 4.9  Pt was brought back into range after a dose of lokelma.  Imaging/Diagnostic Tests: CT Angiogram Chest/Abd/Pelvis 9/28: IMPRESSION: 1. No aortic aneurysm or dissection. 2. 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 Further evaluation with MRI without and with contrast  is recommended. 3. Clusters of ground-glass nodularity involving the right upper lobe most consistent with developing infiltrate. 4. Right lower lobe pleural based consolidation may represent scarring. Underlying mass is not excluded. Close follow-up with CT or further evaluation with PET after resolution of acute inflammatory process recommended. 5. No bowel obstruction. Normal appendix. 6.  Aortic Atherosclerosis (ICD10-I70.0).   Margaretmary Dys, MD 09/03/2023, 7:03 AM  PGY-1, [facility] Health Family Medicine FPTS Intern pager: [phone], text pages welcome Secure chat group [facility] Teaching Service

## 2023-09-03 NOTE — Assessment & Plan Note (Signed)
Pt has acute abdominal pain that began 9/26 and has gradually been getting worse. It radiates to her back and is severe. Serum lipase came back at 534. CT angiogram Abd/Pelv obtained didn'tGiven abdominal pain and lipase, most concerning for pancreatitis. Pt does not have history of pancreatitis. Morphine was ordered in ED for pain control, her pain is well controlled for now. Will switch pain medications when she needs additional support due to her ESRD status - Admit to FMTS, progressive care. Attending physician Dr Pearlean Brownie - Vitals Q4h - Continuous pulse ox - LR will be started at 88 ml / hour for 8 hours  - Consider fentanyl or dilaudid when she needs additional IV pain control - NPO, reassess in am - AM CBC, BMP

## 2023-09-04 DIAGNOSIS — K859 Acute pancreatitis without necrosis or infection, unspecified: Secondary | ICD-10-CM | POA: Diagnosis not present

## 2023-09-04 MED ORDER — QUETIAPINE FUMARATE 25 MG PO TABS
25.0000 mg | ORAL_TABLET | Freq: Once | ORAL | Status: DC
  Filled 2023-09-04: qty 1

## 2023-09-04 MED ORDER — DARBEPOETIN ALFA 200 MCG/0.4ML IJ SOSY
200.0000 ug | PREFILLED_SYRINGE | INTRAMUSCULAR | Status: DC
  Administered 2023-09-05: 200 ug via SUBCUTANEOUS
  Filled 2023-09-04: qty 0.4

## 2023-09-04 NOTE — NC FL2 (Signed)
[place] MEDICAID FL2 LEVEL OF CARE FORM     IDENTIFICATION  Patient Name: Diamond [name] Birthdate: 1951-01-13 Sex: female Admission Date (Current Location): 09/02/2023  [facility] and [place] Number:  Producer, television/film/video and Address:  [facility], [address] [place], [place] [address]      Provider Number: [id number]  Attending Physician Name and Address:  Carney Living, MD  Relative Name and Phone Number:       Current Level of Care: Hospital Recommended Level of Care: Skilled Nursing Facility Prior Approval Number:    Date Approved/Denied:   PASRR Number: [id number]  Discharge Plan: SNF    Current Diagnoses: Patient Active Problem List   Diagnosis Date Noted   Pancreatitis 09/03/2023   Disorder of phosphorus metabolism, unspecified 04/08/2023   Acute on chronic osteomyelitis (HCC) 01/16/2023   Metabolic acidosis 01/16/2023   Chronic osteomyelitis of lumbar spine (HCC) 12/08/2022   Acute cough 12/07/2022   Fever in adult 12/02/2022   Altered mental status 11/30/2022   AMS (altered mental status) 11/30/2022   PAF (paroxysmal atrial fibrillation) (HCC)    Thyroid nodule 09/17/2022   Empty sella (HCC) 09/17/2022   Discitis of lumbosacral region 07/26/2022   Hypertensive urgency 07/21/2022   Bacteremia 06/30/2022   Malnutrition of moderate degree 06/22/2022   Physical deconditioning 06/18/2022   Pressure injury of skin 06/16/2022   MRSA bacteremia 06/13/2022   ESRD on dialysis [facility])    Paroxysmal atrial flutter (HCC)    Acute metabolic encephalopathy    Anemia, unspecified 02/05/2022   Multiple polyps of sigmoid colon    Heme positive stool    Duodenitis    Candida esophagitis (HCC)    Gallbladder mass    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  01/16/2022   Discitis of lumbar region 01/16/2022   Hepatitis C without hepatic coma 01/15/2022   Sepsis due to methicillin resistant Staphylococcus  aureus (HCC) 12/31/2021   Atrial flutter (HCC) 12/25/2021   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 seen on TEE on 1/17 12/24/2021   Lung nodule seen on imaging study 12/23/2021   Vertebral osteomyelitis (HCC)    Elevated white blood cell count, unspecified 12/17/2021   Sepsis due to methicillin susceptible Staphylococcus aureus (HCC) 12/17/2021   Lower back pain 12/17/2021   ESRD on hemodialysis (HCC) 12/17/2021   Encounter for screening for other viral diseases 11/03/2021   Allergy, unspecified, initial encounter 09/14/2021   Coagulation defect, unspecified (HCC) 09/14/2021   Complication of vascular dialysis catheter 09/14/2021   Gout due to renal impairment, right ankle and foot 09/14/2021   Iron deficiency anemia, unspecified 09/14/2021   Pain, unspecified 09/14/2021   Pruritus, unspecified 09/14/2021   Secondary hyperparathyroidism of renal origin (HCC) 09/14/2021   Shortness of breath 09/14/2021   Unspecified protein-calorie malnutrition (HCC) 09/14/2021   Vitamin D deficiency 01/02/2021   Hyperparathyroidism (HCC) 12/31/2020   CKD (chronic kidney disease), stage III (HCC) 11/10/2019   Cardiomegaly 11/05/2019   Hyperkalemia 11/05/2019   Hypokalemia 10/18/2019   Pneumonia due to COVID-19 virus 10/17/2019   Hypertension    Asthma    Gout    Hyponatremia    Multifactorial anemia-acute blood loss from bleeding HD catheter site-superimposed on anemia related to ESRD.     Orientation RESPIRATION BLADDER Height & Weight     Self, Time, Situation, Place  Normal Incontinent Weight: 130 lb 1.1 oz (59 kg) Height:  5\' 6"  (167.6 cm)  BEHAVIORAL SYMPTOMS/MOOD NEUROLOGICAL BOWEL  NUTRITION STATUS      Incontinent Diet (see dc summary)  AMBULATORY STATUS COMMUNICATION OF NEEDS Skin   Extensive Assist Verbally Normal                       Personal Care Assistance Level of Assistance  Bathing, Feeding, Dressing Bathing Assistance: Maximum assistance Feeding  assistance: Independent Dressing Assistance: Maximum assistance     Functional Limitations Info  Sight, Hearing, Speech Sight Info: Adequate Hearing Info: Adequate Speech Info: Adequate    SPECIAL CARE FACTORS FREQUENCY  PT (By licensed PT), OT (By licensed OT)     PT Frequency: 5x week OT Frequency: 5x week            Contractures Contractures Info: Present    Additional Factors Info  Code Status, Allergies Code Status Info: DNR- limited Allergies Info: Shellfish-derived Products  Shrimp (Diagnostic)  Other           Current Medications (09/04/2023):  This is the current hospital active medication list Current Facility-Administered Medications  Medication Dose Route Frequency Provider Last Rate Last Admin   acetaminophen (TYLENOL) tablet 650 mg  650 mg Oral Q6H PRN Lincoln Brigham, MD   650 mg at 09/03/23 2145   albuterol (PROVENTIL) (2.5 MG/3ML) 0.083% nebulizer solution 3 mL  3 mL Inhalation Q4H PRN Margaretmary Dys, MD       amiodarone (PACERONE) tablet 200 mg  200 mg Oral Daily Margaretmary Dys, MD   200 mg at 09/04/23 1914   apixaban (ELIQUIS) tablet 2.5 mg  2.5 mg Oral BID Margaretmary Dys, MD   2.5 mg at 09/04/23 7829   camphor-menthol (SARNA) lotion   Topical TID PRN Margaretmary Dys, MD       cloNIDine (CATAPRES) tablet 0.1 mg  0.1 mg Oral Q6H PRN Margaretmary Dys, MD       [START ON 09/05/2023] Darbepoetin Alfa (ARANESP) injection 200 mcg  200 mcg Subcutaneous Q Mon-1800 Zeyfang, David, PA-C       feeding supplement (BOOST / RESOURCE BREEZE) liquid 1 Container  1 Container Oral TID BM Carney Living, MD   1 Container at 09/04/23 1326   fluticasone furoate-vilanterol (BREO ELLIPTA) 200-25 MCG/ACT 1 puff  1 puff Inhalation Daily Margaretmary Dys, MD   1 puff at 09/04/23 0858   [START ON 09/05/2023] gabapentin (NEURONTIN) capsule 300 mg  300 mg Oral Q M,W,F-2000 Carney Living, MD       [START ON 09/05/2023] hydrALAZINE (APRESOLINE) tablet 50 mg  50 mg Oral 2 times per day on Monday Wednesday Friday Margaretmary Dys, MD       levothyroxine (SYNTHROID) tablet 25 mcg  25 mcg Oral QAC breakfast Margaretmary Dys, MD   25 mcg at 09/04/23 0735   lidocaine (LIDODERM) 5 % 1 patch  1 patch Transdermal Q24H Margaretmary Dys, MD   1 patch at 09/03/23 1018   liver oil-zinc oxide (DESITIN) 40 % ointment   Topical TID PRN Margaretmary Dys, MD       metoprolol tartrate (LOPRESSOR) tablet 50 mg  50 mg Oral BID Margaretmary Dys, MD   50 mg at 09/04/23 0851   ondansetron (ZOFRAN) tablet 4 mg  4 mg Oral Q6H PRN Margaretmary Dys, MD       pantoprazole (PROTONIX) EC tablet 40 mg  40 mg Oral Daily Carlyon Shadow  Stallworth, MD   40 mg at 09/04/23 0851   QUEtiapine (SEROQUEL) tablet 25 mg  25 mg Oral Once Celine Mans, MD       sodium zirconium cyclosilicate [facility]) packet 10 g  10 g Oral BID Margaretmary Dys, MD   10 g at 09/04/23 9147     Discharge Medications: Please see discharge summary for a list of discharge medications.  Relevant Imaging Results:  Relevant Lab Results:   Additional Information SS#: [id number] OPHD at [facility] MWF  [place], [place]

## 2023-09-04 NOTE — Assessment & Plan Note (Signed)
-   Nephrology aware, appreciate recs  - Avoiding nephrotoxic medications - Dosing sevelemer per pharmacy recommendations, appreciate their assistance

## 2023-09-04 NOTE — Discharge Instructions (Signed)
Ms. Diamond, [name] were admitted to the hospital due to abdominal pain. We believe you had a condition called pancreatitis which is inflammation of the pancreas and can be quite painful.  Thankfully, your symptoms seem to improve on their own with some time.  Unfortunately however not entirely sure why you had the symptoms.  It is possible these are related to one of your medications, though you are not on any particularly high risk medications.  You will need to work with your primary care doctor to prevent this from happening in the future.  Thankfully, it does not seem like this was related to your chronic back infection at this time.  Please be sure to follow-up with both your primary doctors at [facility] and Dr. Renold Don at infectious diseases.  Eliezer Mccoy, MD

## 2023-09-04 NOTE — Discharge Summary (Signed)
Family Medicine Teaching [facility] Discharge Summary  Patient name: Diamond [name] Medical record number: [id number] Date of birth: 01-23-1951 Age: 72 y.o. Gender: female Date of Admission: 09/02/2023  Date of Discharge: 09/04/2023 Admitting Physician: Margaretmary Dys, MD  Primary Care Provider: [facility], [facility] Consultants: Nephrology  Indication for Hospitalization: Abdominal pain, pancreatitis   Brief Hospital Course:  Pancreatitis, Drug induced vs idiopathic Pt presented to the [facility] ED on 9/27 with two days of abdominal pain. ER workup significant for serum lipase 534. CT angiogram Abd/Pelv showed only mild dilatation of the pancreatic duct without activa inflammatory changes. Given abdominal pain and lipase, symptoms were felt most consistent with pancreatitis. Though no definitive cause was readily identifiable, not thought to be due to alcohol (she does not drink), gallstone pancreatitis (had a cholecystectomy, no stones in the CBD on CT), and does not appear to be associated with any hepatic injury. Considered that this could be 2/2 medication, though there are no very high risk meds (such as GLP-1a) on her med list, vs idiopathic. By hospital day 2 she was tolerating a solid diet without pain or need for analgesia.    Issues for Follow-up with PCP: 1. CT identified a RLL pleural based consolidation thought to represent scarring vs an underlying mass with a recommendation for follow-up CT or PET after resolution of the acute inflammatory process.  2. Evaluate whether any of the patient's medications can be eliminated that might contribute to pancreatitis. 3. Should she develop further infectious symptoms, would repeat her CBC, blood cultures given known history of spinal osteo/disciitis and recurrent MRSA bacteremia. Labs on admission were not suggestive of this.  4. Please ensure ID follow up for her osteomyelitis/disciitis  Discharge  Diagnoses/Problem List:  Principal Problem:   Pancreatitis Active Problems:   ESRD on hemodialysis (HCC)   Hypertension   Paroxysmal atrial flutter (HCC)   PAF (paroxysmal atrial fibrillation) (HCC)   Hyperkalemia   Lung nodule seen on imaging study   Anemia, unspecified   Disposition: Return to [facility]  Discharge Condition: Improved, stable   Discharge Exam:  Gen: elderly female, engaged and in good spirits Cardio: RRR, transmitted fistula sounds Pulm: Normal WOB on RA   Significant Procedures: None  Significant Labs and Imaging:  Recent Labs  Lab 09/02/23 1256 09/03/23 0356  WBC 5.0 5.4  HGB 8.2* 8.4*  HCT 24.1* 25.2*  PLT 172 168   Recent Labs  Lab 09/02/23 1256 09/02/23 2202 09/03/23 0025 09/03/23 0356  NA 135  --   --  133*  K 5.8*   < > 4.6 4.9  CL 93*  --   --  95*  CO2 24  --   --  25  GLUCOSE 80  --   --  108*  BUN 65*  --   --  69*  CREATININE 8.43*  --   --  9.42*  CALCIUM 9.4  --   --  9.2  MG 2.3  --   --   --   ALKPHOS 85  --   --   --   AST 24  --   --   --   ALT 19  --   --   --   ALBUMIN 2.8*  --   --   --    < > = values in this interval not displayed.    Results/Tests Pending at Time of Discharge: None   Discharge Medications:  Allergies as of 09/04/2023       Reactions   Shellfish-derived Products Other (See Comments)   Shrimp (diagnostic) Other (See Comments)   Unknown reaction   Other Swelling, Rash   Anesthesia - facial swelling, rash        Medication List     TAKE these medications    acetaminophen 500 MG tablet Commonly known as: TYLENOL Take 1,000 mg by mouth every 8 (eight) hours as needed (for back and leg pain). Take 2 tablets (1000mg  total) by mouth every 8 hours as needed for back and leg pain.   albuterol 108 (90 Base) MCG/ACT inhaler Commonly known as: VENTOLIN HFA Inhale 2 puffs into the lungs every 4 (four) hours as needed for wheezing or shortness of breath.   amiodarone 200 MG  tablet Commonly known as: PACERONE Take 1 tablet (200 mg total) by mouth daily.   apixaban 2.5 MG Tabs tablet Commonly known as: ELIQUIS Take 1 tablet (2.5 mg total) by mouth 2 (two) times daily.   Biofreeze 4 % Gel Generic drug: Menthol (Topical Analgesic) Apply 1 Application topically 3 (three) times daily as needed (lower back and knee pain).   cloNIDine 0.1 MG tablet Commonly known as: CATAPRES Take 0.1 mg by mouth every 6 (six) hours as needed (SBP > 170).   Desitin Multi-Purpose Healing Oint Apply 1 Application topically 3 (three) times daily as needed (with incontinence to prevent skin breakdown).   fluticasone-salmeterol 250-50 MCG/ACT Aepb Commonly known as: Advair Diskus Inhale 1 puff into the lungs in the morning and at bedtime.   gabapentin 300 MG capsule Commonly known as: NEURONTIN Take 300 mg by mouth 3 (three) times a week. Take on Mondays, Wednesdays and Fridays after dialysis.   hydrALAZINE 50 MG tablet Commonly known as: APRESOLINE Take 50 mg by mouth See admin instructions. Take 1 tablet by mouth two times daily on Mon-Wed-Fri, and 1 tablet by mouth three times daily on Tues-Thurs-Sat.   HYDROcodone-acetaminophen 7.5-325 MG tablet Commonly known as: NORCO Take 1 tablet by mouth 2 (two) times daily.   levothyroxine 25 MCG tablet Commonly known as: SYNTHROID Take 25 mcg by mouth daily before breakfast.   lidocaine 4 % Place 1 patch onto the skin daily. Apply for 12 hours to lower back and leave off for 12 hours between patches.   liver oil-zinc oxide 40 % ointment Commonly known as: DESITIN Apply 1 Application topically as needed (to buttocks area to prevent MASD).   metoprolol tartrate 50 MG tablet Commonly known as: LOPRESSOR Take 1 tablet (50 mg total) by mouth 2 (two) times daily.   Minerin Lotn Apply 1 Application topically 3 (three) times daily as needed (dry skin).   Nutritional Supplement Liqd Take 120 mLs by mouth 3 (three) times daily.  Sugar free - record amount consumed   ondansetron 4 MG tablet Commonly known as: ZOFRAN Take 4 mg by mouth every 6 (six) hours as needed for nausea or vomiting.   pantoprazole 40 MG tablet Commonly known as: PROTONIX Take 1 tablet (40 mg total) by mouth daily.   sevelamer 800 MG tablet Commonly known as: RENAGEL Take 800 mg by mouth See admin instructions. Take 2 tablets (1600mg  total) by mouth 3 times a day every Tuesday, Thursday and Saturday. Take 2 tablets (1600mg  total) with meals every Monday, Wednesday, Friday and Sunday.        Discharge Instructions: Please refer to Patient Instructions section of EMR for full details.  Patient was counseled important  signs and symptoms that should prompt return to medical care, changes in medications, dietary instructions, activity restrictions, and follow up appointments.   Follow-Up Appointments:  Follow-up [employer], [address] Schedule an appointment as soon as possible for a visit in 3 day(s).   Why: Make an appointment for hospital follow-up Contact information: [address] [place] [address] [phone]         Raymondo Band, MD. Schedule an appointment as soon as possible for a visit.   Specialty: Infectious Diseases Why: You are due for follow-up with Dr. Yvonna Alanis information: [address] [place] [address] [phone]                 Alicia Amel, MD 09/04/2023, 6:17 AM PGY-3, [facility] Health Family Medicine

## 2023-09-04 NOTE — TOC Initial Note (Signed)
Transition of Care [facility]) - Initial/Assessment Note    Patient Details  Name: Diamond [name] MRN: [id number] Date of Birth: [birth year]  Transition of Care [facility]) CM/SW Contact:    Jimmy Picket, LCSW Phone Number: 09/04/2023, 4:41 PM  Clinical Narrative:                  Pt is a LTC resident at [facility]. Per MD patient is ready for DC. CSW spoke with admissions coordinator that stated pt can be admitted back to facility on Monday 9/30. [facility] will provide transportation back to facility.   Expected Discharge Plan: Skilled Nursing Facility Barriers to Discharge: Continued Medical Work up   Patient Goals and CMS Choice   CMS Medicare.gov Compare Post Acute Care list provided to:: Patient Choice offered to / list presented to : Patient      Expected Discharge Plan and Services     Post Acute Care Choice: Skilled Nursing Facility Living arrangements for the past 2 months: Skilled Nursing Facility                                      Prior Living Arrangements/Services Living arrangements for the past 2 months: Skilled Nursing Facility Lives with:: Facility Resident Patient language and need for interpreter reviewed:: Yes        Need for Family Participation in Patient Care: Yes (Comment) Care giver support system in place?: No (comment)      Activities of Daily Living   ADL Screening (condition at time of admission) Does the patient have a NEW difficulty with bathing/dressing/toileting/self-feeding that is expected to last >3 days?: No Does the patient have a NEW difficulty with getting in/out of bed, walking, or climbing stairs that is expected to last >3 days?: No Does the patient have a NEW difficulty with communication that is expected to last >3 days?: No Is the patient deaf or have difficulty hearing?: No Does the patient have difficulty seeing, even when wearing glasses/contacts?: No Does the patient have difficulty concentrating, remembering, or making  decisions?: No  Permission Sought/Granted Permission sought to share information with : Case Manager       Permission granted to share info w AGENCY: [facility]        Emotional Assessment Appearance:: Appears stated age Attitude/Demeanor/Rapport: Unable to Assess Affect (typically observed): Unable to Assess Orientation: : Oriented to Self, Oriented to Place, Oriented to  Time, Oriented to Situation Alcohol / Substance Use: Not Applicable Psych Involvement: No (comment)  Admission diagnosis:  Hyperkalemia [E87.5] Pancreatitis [K85.90] Paroxysmal atrial flutter (HCC) [I48.92] ESRD on hemodialysis (HCC) [N18.6, Z99.2] Anemia, unspecified type [D64.9] Acute pancreatitis, unspecified complication status, unspecified pancreatitis type [K85.90] Patient Active Problem List   Diagnosis Date Noted   Pancreatitis 09/03/2023   Disorder of phosphorus metabolism, unspecified 04/08/2023   Acute on chronic osteomyelitis (HCC) 01/16/2023   Metabolic acidosis 01/16/2023   Chronic osteomyelitis of lumbar spine (HCC) 12/08/2022   Acute cough 12/07/2022   Fever in adult 12/02/2022   Altered mental status 11/30/2022   AMS (altered mental status) 11/30/2022   PAF (paroxysmal atrial fibrillation) (HCC)    Thyroid nodule 09/17/2022   Empty sella (HCC) 09/17/2022   Discitis of lumbosacral region 07/26/2022   Hypertensive urgency 07/21/2022   Bacteremia 06/30/2022   Malnutrition of moderate degree 06/22/2022   Physical deconditioning 06/18/2022   Pressure injury of skin 06/16/2022   MRSA  bacteremia 06/13/2022   ESRD on dialysis [facility])    Paroxysmal atrial flutter (HCC)    Acute metabolic encephalopathy    Anemia, unspecified 02/05/2022   Multiple polyps of sigmoid colon    Heme positive stool    Duodenitis    Candida esophagitis (HCC)    Gallbladder mass    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  01/16/2022   Discitis of lumbar region 01/16/2022   Hepatitis C  without hepatic coma 01/15/2022   Sepsis due to methicillin resistant Staphylococcus aureus (HCC) 12/31/2021   Atrial flutter (HCC) 12/25/2021   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 seen on TEE on 1/17 12/24/2021   Lung nodule seen on imaging study 12/23/2021   Vertebral osteomyelitis (HCC)    Elevated white blood cell count, unspecified 12/17/2021   Sepsis due to methicillin susceptible Staphylococcus aureus (HCC) 12/17/2021   Lower back pain 12/17/2021   ESRD on hemodialysis (HCC) 12/17/2021   Encounter for screening for other viral diseases 11/03/2021   Allergy, unspecified, initial encounter 09/14/2021   Coagulation defect, unspecified (HCC) 09/14/2021   Complication of vascular dialysis catheter 09/14/2021   Gout due to renal impairment, right ankle and foot 09/14/2021   Iron deficiency anemia, unspecified 09/14/2021   Pain, unspecified 09/14/2021   Pruritus, unspecified 09/14/2021   Secondary hyperparathyroidism of renal origin (HCC) 09/14/2021   Shortness of breath 09/14/2021   Unspecified protein-calorie malnutrition (HCC) 09/14/2021   Vitamin D deficiency 01/02/2021   Hyperparathyroidism (HCC) 12/31/2020   CKD (chronic kidney disease), stage III (HCC) 11/10/2019   Cardiomegaly 11/05/2019   Hyperkalemia 11/05/2019   Hypokalemia 10/18/2019   Pneumonia due to COVID-19 virus 10/17/2019   Hypertension    Asthma    Gout    Hyponatremia    Multifactorial anemia-acute blood loss from bleeding HD catheter site-superimposed on anemia related to ESRD.    PCP:  [facility]:  No Pharmacies Listed    Social Determinants of Health (SDOH) Social History: SDOH Screenings   Food Insecurity: No Food Insecurity (09/03/2023)  Housing: Low Risk  (09/03/2023)  Transportation Needs: No Transportation Needs (09/03/2023)  Utilities: Not At Risk (09/03/2023)  Depression (PHQ2-9): Low Risk  (03/17/2023)  Social Connections:  Moderately Integrated (07/20/2022)  Tobacco Use: Low Risk  (09/02/2023)   SDOH Interventions:     Readmission Risk Interventions    06/30/2022    1:12 PM 06/17/2022    1:02 PM 01/22/2022   12:19 PM  Readmission Risk Prevention Plan  Transportation Screening Complete Complete Complete  Medication Review [employer]) Complete Complete Complete  PCP or Specialist appointment within 3-5 days of discharge Complete Complete Complete  HRI or Home Care Consult Complete Complete Complete  SW Recovery Care/Counseling Consult Complete Complete Complete  Palliative Care Screening Not Applicable Not Applicable Not Applicable  Skilled Nursing Facility Not Applicable Complete Not Applicable

## 2023-09-04 NOTE — Progress Notes (Addendum)
Subjective: Seen in room, tolerating liquid diet feels good, tolerated dialysis yesterday on schedule thinks discharge today  Objective Vital signs in last 24 hours: Vitals:   09/03/23 2051 09/03/23 2131 09/04/23 0303 09/04/23 0758  BP: (!) 172/102 (!) 184/88 (!) 165/91 (!) 175/90  Pulse: 60 68 60 63  Resp: (!) 9 14 15 15   Temp: 98 F (36.7 C) 98.6 F (37 C) 98.5 F (36.9 C) 98.4 F (36.9 C)  TempSrc: Oral Oral Oral Oral  SpO2: 99% 98% 97%   Weight:      Height:       Weight change:   Physical Exam: General: Alert pleasant early female NAD Heart: RRR no MRG Lungs: CTA bilaterally nonlabored room air Abdomen: NABS, soft NTND, no ascites Extremities: No pedal edema Access: Left upper arm AV fistula positive bruit   Dialysis Orders: Center: [facility] on MWF 3.5 hours EDW 51 kg 2K, 2 Ca bath, left upper arm AV fistula, Mircera 200 every 2 weeks not given since 08/15/23 Venofer 50 mg weekly, no heparin no vitamin D      Assessment/Plan ESRD= HD MWF, missed HD Friday dialysis yesterday for missed schedule next scheduled on Monday will notify op unit if dc Mild hyperkalemia 5.8 on admission improved with Lokelma and meds HD k 4.6 Acute recurrent pancreatitis= per admit team, this morning symptoms resolved Hypertension/volume  -no excess volume BP stable for her pain keep even on dialysis continue current home BP meds Anemia  -Hgb 8.4 dose Aranesp today, plan for next Wednesday Metabolic bone disease -calcium at goal, continue phosphate binders with meals, no vitamin D Lung nodule seen on CT angiogram follow-up as outpatient per admit team Nutrition -renal failure diet as tolerated  Diamond Pastel, PA-C [facility] [phone] 09/04/2023,8:16 AM  LOS: 1 day   Labs: Basic Metabolic Panel: Recent Labs  Lab 09/02/23 1256 09/02/23 2202 09/03/23 0025 09/03/23 0356  NA 135  --   --  133*  K 5.8* 5.8* 4.6 4.9  CL 93*  --   --  95*  CO2 24  --   --  25   GLUCOSE 80  --   --  108*  BUN 65*  --   --  69*  CREATININE 8.43*  --   --  9.42*  CALCIUM 9.4  --   --  9.2   Liver Function Tests: Recent Labs  Lab 09/02/23 1256  AST 24  ALT 19  ALKPHOS 85  BILITOT 0.8  PROT 6.5  ALBUMIN 2.8*   Recent Labs  Lab 09/02/23 1256  LIPASE 534*   No results for input(s): "AMMONIA" in the last 168 hours. CBC: Recent Labs  Lab 09/02/23 1256 09/03/23 0356  WBC 5.0 5.4  NEUTROABS 2.7  --   HGB 8.2* 8.4*  HCT 24.1* 25.2*  MCV 97.6 98.4  PLT 172 168   Cardiac Enzymes: No results for input(s): "CKTOTAL", "CKMB", "CKMBINDEX", "TROPONINI" in the last 168 hours. CBG: Recent Labs  Lab 09/03/23 2140  GLUCAP 79    Studies/Results: CT Angio Chest/Abd/Pel for Dissection W and/or Wo Contrast  Result Date: 09/02/2023 CLINICAL DATA:  Abdominal pain.  Concern for aortic aneurysm. EXAM: CT ANGIOGRAPHY CHEST, ABDOMEN AND PELVIS TECHNIQUE: Non-contrast CT of the chest was initially obtained. Multidetector CT imaging through the chest, abdomen and pelvis was performed using the standard protocol during bolus administration of intravenous contrast. Multiplanar reconstructed images and MIPs were obtained and reviewed to evaluate the vascular anatomy. RADIATION DOSE  REDUCTION: This exam was performed according to the departmental dose-optimization program which includes automated exposure control, adjustment of the mA and/or kV according to patient size and/or use of iterative reconstruction technique. CONTRAST:  75mL OMNIPAQUE IOHEXOL 350 MG/ML SOLN COMPARISON:  CT abdomen pelvis dated 07/07/2022. chest CT dated 06/15/2022. FINDINGS: CTA CHEST FINDINGS Cardiovascular: There is no cardiomegaly or pericardial effusion. Coronary vascular calcification of the LAD and left circumflex artery. There is mild atherosclerotic calcification of the thoracic aorta. No aneurysmal dilatation or dissection. The origins of the great vessels of the aortic arch appear patent. Mild  dilatation and irregularity of the left subclavian vein and left upper extremity veins secondary to AV fistula. The central pulmonary arteries appear patent. Mediastinum/Nodes: No hilar or mediastinal adenopathy. The esophagus is grossly unremarkable. No mediastinal fluid collection. Lungs/Pleura: Clusters of ground-glass nodularity involving the right upper lobe most consistent with developing infiltrate. There is a triangular pleural based area of consolidation in the right lower lobe, new since the CT of 06/15/2022, which may represent scarring. Underlying mass is not excluded. Close follow-up with CT or further evaluation with PET after resolution of acute inflammatory process recommended. Similar appearance of an 8 mm right middle lobe and a 5 mm left lower lobe nodules. There is no pleural effusion or pneumothorax. The central airways are patent. Musculoskeletal: Osteopenia with degenerative changes of the spine. Left breast calcification. No acute osseous pathology. Review of the MIP images confirms the above findings. CTA ABDOMEN AND PELVIS FINDINGS VASCULAR Aorta: Moderate atherosclerotic calcification. No aneurysmal dilatation or dissection. The aorta is tortuous. Celiac: Atherosclerotic calcification of the origin of the celiac axis. The celiac artery and its major branches are patent. SMA: The SMA is patent. Renals: The renal arteries appear patent. IMA: The IMA is patent. Inflow: Moderate atherosclerotic calcification the iliac arteries. The iliac arteries are patent. No aneurysm or dissection. Veins: No obvious venous abnormality within the limitations of this arterial phase study. Review of the MIP images confirms the above findings. NON-VASCULAR No intra-abdominal free air or free fluid. Hepatobiliary: The liver is unremarkable. Cholecystectomy. Mild biliary ductal dilatation likely post cholecystectomy. The common bile duct measures approximately 1 cm in diameter. No calcified stone noted in the  central CBD. Pancreas: There is mild dilatation of the main pancreatic duct similar to prior CT. No active inflammatory changes. Spleen: Normal in size without focal abnormality. Adrenals/Urinary Tract: The adrenal glands unremarkable. Moderate bilateral renal parenchyma atrophy. Multiple bilateral renal cysts and additional smaller hypodense lesions are not characterized on this CT. There is no hydronephrosis on either side. The visualized ureters and urinary bladder appear unremarkable. Stomach/Bowel: Moderate stool throughout the colon. There is no bowel obstruction or active inflammation. The appendix is normal. Lymphatic: No adenopathy. Reproductive: The uterus is grossly unremarkable. No adnexal masses. Other: None Musculoskeletal: Osteopenia with degenerative changes. There is severe fragmentation of the L3 with approximately 40% loss of vertebral body height. There is a 7 mm retropulsed fragment of the superior posterior cortex causing moderate focal narrowing of the central canal. Additional areas of fragmentation involving the inferior endplate of L2. Findings concerning for spondylo-discitis. Further evaluation with MRI without and with contrast is recommended. Review of the MIP images confirms the above findings. IMPRESSION: 1. No aortic aneurysm or dissection. 2. 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 Further evaluation with MRI without and with contrast is recommended. 3. Clusters of ground-glass nodularity involving the right upper lobe  most consistent with developing infiltrate. 4. Right lower lobe pleural based consolidation may represent scarring. Underlying mass is not excluded. Close follow-up with CT or further evaluation with PET after resolution of acute inflammatory process recommended. 5. No bowel obstruction. Normal appendix. 6.  Aortic Atherosclerosis (ICD10-I70.0). Electronically Signed   By: Elgie Collard M.D.   On: 09/02/2023 19:24   Medications:   amiodarone  200 mg Oral Daily   apixaban  2.5 mg Oral BID   feeding supplement  1 Container Oral TID BM   fluticasone furoate-vilanterol  1 puff Inhalation Daily   [START ON 09/05/2023] gabapentin  300 mg Oral Q M,W,F-2000   [START ON 09/05/2023] hydrALAZINE  50 mg Oral 2 times per day on Monday Wednesday Friday   levothyroxine  25 mcg Oral QAC breakfast   lidocaine  1 patch Transdermal Q24H   metoprolol tartrate  50 mg Oral BID   pantoprazole  40 mg Oral Daily   QUEtiapine  25 mg Oral Once   sodium zirconium cyclosilicate  10 g Oral BID

## 2023-09-04 NOTE — Progress Notes (Signed)
Daily Progress Note Intern Pager: [phone]  Patient name: Diamond [name] Medical record number: [id number] Date of birth: 23-Feb-1951 Age: 72 y.o. Gender: female  Primary Care Provider: [facility] Consultants: Nephrology  Code Status: DNR    Assessment and Plan:  Vangie Speegle is a 72 year old female past medical history significant for end-stage renal disease, atrial fibrillation, hepatitis C, HTN who was admitted 9/28 for two days of severe worsening abdominal pain with nausea and vomiting that radiated to her back. She was found to have an elevated lipase of 539 and was admitted for acute pancreatitis.  Assessment & Plan Pancreatitis Tolerating PO intake  ESRD on hemodialysis [facility]) - Nephrology aware, appreciate recs  - Avoiding nephrotoxic medications - Dosing sevelemer per pharmacy recommendations, appreciate their assistance  This patient is stable for discharge, consulted TOC this a.m. as she was at [facility] before coming here.  Unfortunately, patient cannot return to [facility] until tomorrow.  Subjective:  No complaints   Objective: Temp:  [98 F (36.7 C)-98.8 F (37.1 C)] 98.8 F (37.1 C) (09/29 1208) Pulse Rate:  [59-68] 59 (09/29 1208) Resp:  [9-18] 18 (09/29 1208) BP: (143-184)/(79-102) 158/82 (09/29 1208) SpO2:  [97 %-100 %] 99 % (09/29 1208) Gen: elderly female, engaged and in good spirits Cardio: RRR, transmitted fistula sounds Pulm: Normal WOB on RA  Laboratory: Most recent CBC Lab Results  Component Value Date   WBC 5.4 09/03/2023   HGB 8.4 (L) 09/03/2023   HCT 25.2 (L) 09/03/2023   MCV 98.4 09/03/2023   PLT 168 09/03/2023   Most recent BMP    Latest Ref Rng & Units 09/03/2023    3:56 AM  BMP  Glucose 70 - 99 mg/dL 147   BUN 8 - 23 mg/dL 69   Creatinine 8.29 - 1.00 mg/dL 5.62   Sodium 130 - 865 mmol/L 133   Potassium 3.5 - 5.1 mmol/L 4.9   Chloride 98 - 111 mmol/L 95   CO2 22 - 32 mmol/L 25    Calcium 8.9 - 10.3 mg/dL 9.2     Alfredo Martinez, MD 09/04/2023, 2:13 PM  PGY-2, [facility] FPTS Intern pager: [phone], text pages welcome Secure chat group [facility] Teaching Service

## 2023-09-04 NOTE — Assessment & Plan Note (Signed)
Tolerating PO intake

## 2023-09-05 DIAGNOSIS — D649 Anemia, unspecified: Secondary | ICD-10-CM | POA: Diagnosis not present

## 2023-09-05 LAB — RETICULOCYTES
Immature Retic Fract: 5.6 % (ref 2.3–15.9)
RBC.: 2.02 MIL/uL — ABNORMAL LOW (ref 3.87–5.11)
Retic Count, Absolute: 31.5 10*3/uL (ref 19.0–186.0)
Retic Ct Pct: 1.6 % (ref 0.4–3.1)

## 2023-09-05 LAB — DIFFERENTIAL
Abs Immature Granulocytes: 0.01 10*3/uL (ref 0.00–0.07)
Basophils Absolute: 0 10*3/uL (ref 0.0–0.1)
Basophils Relative: 1 %
Eosinophils Absolute: 0.1 10*3/uL (ref 0.0–0.5)
Eosinophils Relative: 3 %
Immature Granulocytes: 0 %
Lymphocytes Relative: 23 %
Lymphs Abs: 0.9 10*3/uL (ref 0.7–4.0)
Monocytes Absolute: 0.5 10*3/uL (ref 0.1–1.0)
Monocytes Relative: 13 %
Neutro Abs: 2.3 10*3/uL (ref 1.7–7.7)
Neutrophils Relative %: 60 %

## 2023-09-05 LAB — RENAL FUNCTION PANEL
Albumin: 2.3 g/dL — ABNORMAL LOW (ref 3.5–5.0)
Anion gap: 15 (ref 5–15)
BUN: 40 mg/dL — ABNORMAL HIGH (ref 8–23)
CO2: 28 mmol/L (ref 22–32)
Calcium: 8.9 mg/dL (ref 8.9–10.3)
Chloride: 94 mmol/L — ABNORMAL LOW (ref 98–111)
Creatinine, Ser: 5.77 mg/dL — ABNORMAL HIGH (ref 0.44–1.00)
GFR, Estimated: 7 mL/min — ABNORMAL LOW (ref >60)
Glucose, Bld: 98 mg/dL (ref 70–99)
Phosphorus: 5.6 mg/dL — ABNORMAL HIGH (ref 2.5–4.6)
Potassium: 3.7 mmol/L (ref 3.5–5.1)
Sodium: 137 mmol/L (ref 135–145)

## 2023-09-05 LAB — CBC
HCT: 19.3 % — ABNORMAL LOW (ref 36.0–46.0)
HCT: 19.4 % — ABNORMAL LOW (ref 36.0–46.0)
HCT: 20.2 % — ABNORMAL LOW (ref 36.0–46.0)
Hemoglobin: 6.7 g/dL — CL (ref 12.0–15.0)
Hemoglobin: 6.6 g/dL — CL (ref 12.0–15.0)
Hemoglobin: 7 g/dL — ABNORMAL LOW (ref 12.0–15.0)
MCH: 34.4 pg — ABNORMAL HIGH (ref 26.0–34.0)
MCH: 33 pg (ref 26.0–34.0)
MCH: 34.1 pg — ABNORMAL HIGH (ref 26.0–34.0)
MCHC: 34.7 g/dL (ref 30.0–36.0)
MCHC: 34 g/dL (ref 30.0–36.0)
MCHC: 34.7 g/dL (ref 30.0–36.0)
MCV: 99 fL (ref 80.0–100.0)
MCV: 97 fL (ref 80.0–100.0)
MCV: 98.5 fL (ref 80.0–100.0)
Platelets: 120 10*3/uL — ABNORMAL LOW (ref 150–400)
Platelets: 124 10*3/uL — ABNORMAL LOW (ref 150–400)
Platelets: 127 10*3/uL — ABNORMAL LOW (ref 150–400)
RBC: 1.95 MIL/uL — ABNORMAL LOW (ref 3.87–5.11)
RBC: 2 MIL/uL — ABNORMAL LOW (ref 3.87–5.11)
RBC: 2.05 MIL/uL — ABNORMAL LOW (ref 3.87–5.11)
RDW: 16.7 % — ABNORMAL HIGH (ref 11.5–15.5)
RDW: 16.9 % — ABNORMAL HIGH (ref 11.5–15.5)
RDW: 16.3 % — ABNORMAL HIGH (ref 11.5–15.5)
WBC: 3.5 10*3/uL — ABNORMAL LOW (ref 4.0–10.5)
WBC: 3.6 10*3/uL — ABNORMAL LOW (ref 4.0–10.5)
WBC: 3.8 10*3/uL — ABNORMAL LOW (ref 4.0–10.5)
nRBC: 0 % (ref 0.0–0.2)
nRBC: 0 % (ref 0.0–0.2)
nRBC: 0 % (ref 0.0–0.2)

## 2023-09-05 LAB — PREPARE RBC (CROSSMATCH)

## 2023-09-05 LAB — BILIRUBIN, FRACTIONATED(TOT/DIR/INDIR)
Bilirubin, Direct: 0.1 mg/dL (ref 0.0–0.2)
Indirect Bilirubin: 0.2 mg/dL — ABNORMAL LOW (ref 0.3–0.9)
Total Bilirubin: 0.3 mg/dL (ref 0.3–1.2)

## 2023-09-05 LAB — TECHNOLOGIST SMEAR REVIEW: Plt Morphology: NORMAL

## 2023-09-05 LAB — FERRITIN: Ferritin: 1042 ng/mL — ABNORMAL HIGH (ref 11–307)

## 2023-09-05 LAB — LACTATE DEHYDROGENASE: LDH: 119 U/L (ref 98–192)

## 2023-09-05 MED ORDER — SODIUM CHLORIDE 0.9% IV SOLUTION: Freq: Once | INTRAVENOUS | Status: DC

## 2023-09-05 NOTE — Plan of Care (Addendum)
MD notified of critical Hgb 6.7.   No obvious signs of bleeding noted anywhere. BP stable. MD called patient to discuss blood transfusion with her. Awaiting type and screen labs.   Problem: Education: Goal: Knowledge of General Education information will improve Description: Including pain rating scale, medication(s)/side effects and non-pharmacologic comfort measures Outcome: Progressing   Problem: Health Behavior/Discharge Planning: Goal: Ability to manage health-related needs will improve Outcome: Progressing   Problem: Clinical Measurements: Goal: Ability to maintain clinical measurements within normal limits will improve Outcome: Progressing Goal: Will remain free from infection Outcome: Progressing Goal: Diagnostic test results will improve Outcome: Progressing Goal: Respiratory complications will improve Outcome: Progressing Goal: Cardiovascular complication will be avoided Outcome: Progressing   Problem: Elimination: Goal: Will not experience complications related to bowel motility Outcome: Progressing Goal: Will not experience complications related to urinary retention Outcome: Progressing

## 2023-09-05 NOTE — Plan of Care (Signed)

## 2023-09-05 NOTE — Progress Notes (Signed)
[facility] Progress Note   Subjective:   Patient seen and examined at bedside in dialysis.  States she is feeling well.  Denies CP, SOB, abdominal pain and n/v/d.  Hgb drop this AM to 6.6.  Denies visible blood loss, melena, hematochezia, hemoptysis and hematemesis.  Plan for transfusion 1 unit pRBC.    Objective Vitals:   09/05/23 0849 09/05/23 0909 09/05/23 0930 09/05/23 1000  BP:  139/79 (!) 165/85 (!) 176/85  Pulse:  (!) 57 (!) 59 (!) 56  Resp:  14 16 12   Temp:      TempSrc:      SpO2:  100% 100% 100%  Weight: 51.6 kg     Height:       Physical Exam General:alert, pleasant female in NAD Heart:RRR, no mrg Lungs:CTAB anteriorly Abdomen:soft, NTND Extremities:no LE edema Dialysis Access: LU AVF +b/t   Filed Weights   09/02/23 1211 09/05/23 0849  Weight: 59 kg 51.6 kg   No intake or output data in the 24 hours ending 09/05/23 1038  Additional Objective Labs: Basic Metabolic Panel: Recent Labs  Lab 09/02/23 1256 09/02/23 2202 09/03/23 0025 09/03/23 0356 09/05/23 0458  NA 135  --   --  133* 137  K 5.8*   < > 4.6 4.9 3.7  CL 93*  --   --  95* 94*  CO2 24  --   --  25 28  GLUCOSE 80  --   --  108* 98  BUN 65*  --   --  69* 40*  CREATININE 8.43*  --   --  9.42* 5.77*  CALCIUM 9.4  --   --  9.2 8.9  PHOS  --   --   --   --  5.6*   < > = values in this interval not displayed.   Liver Function Tests: Recent Labs  Lab 09/02/23 1256 09/05/23 0458  AST 24  --   ALT 19  --   ALKPHOS 85  --   BILITOT 0.8  --   PROT 6.5  --   ALBUMIN 2.8* 2.3*   Recent Labs  Lab 09/02/23 1256  LIPASE 534*   CBC: Recent Labs  Lab 09/02/23 1256 09/03/23 0356 09/05/23 0458 09/05/23 0830  WBC 5.0 5.4 3.5* 3.6*  NEUTROABS 2.7  --   --   --   HGB 8.2* 8.4* 6.7* 6.6*  HCT 24.1* 25.2* 19.3* 19.4*  MCV 97.6 98.4 99.0 97.0  PLT 172 168 120* 124*   Blood Culture    Component Value Date/Time   SDES BLOOD RIGHT HAND 08/17/2023 1104   SPECREQUEST   08/17/2023 1104    BOTTLES DRAWN AEROBIC AND ANAEROBIC Blood Culture adequate volume   CULT  08/17/2023 1104    NO GROWTH 5 DAYS Performed at [facility], [address] [place], [place] [address]    REPTSTATUS 08/22/2023 FINAL 08/17/2023 1104   CBG: Recent Labs  Lab 09/03/23 2140  GLUCAP 79   Lab Results  Component Value Date   INR 1.2 01/25/2023   INR 1.0 10/05/2022   INR 1.1 09/16/2022   Medications:   sodium chloride   Intravenous Once   amiodarone  200 mg Oral Daily   darbepoetin (ARANESP) injection - NON-DIALYSIS  200 mcg Subcutaneous Q Mon-1800   feeding supplement  1 Container Oral TID BM   fluticasone furoate-vilanterol  1 puff Inhalation Daily   gabapentin  300 mg Oral Q M,W,F-2000   hydrALAZINE  50 mg  Oral 2 times per day on Monday Wednesday Friday   levothyroxine  25 mcg Oral QAC breakfast   lidocaine  1 patch Transdermal Q24H   metoprolol tartrate  50 mg Oral BID   pantoprazole  40 mg Oral Daily   QUEtiapine  25 mg Oral Once    Dialysis Orders: [facility] on MWF 3.5 hours EDW 51 kg 2K, 2 Ca bath, left upper arm AV fistula, Mircera 200 every 2 weeks not given since 08/15/23 Venofer 50 mg weekly, no heparin no vitamin D      Assessment/Plan ESRD- HD MWF - HD today per regular schedule Mild hyperkalemia - K 5.8 on admission improved with medical management, k 3.7 today Pancytopenia - WBC, Hgb, plt drop this AM.  Getting 1 unit pRBC. Has had similar labs in the past. Acute recurrent pancreatitis- per admit team, this morning symptoms resolved Hypertension/volume  -BP variable, continue home meds.  Does not appear volume overloaded.  Close to dry weight. UF as tolerated.  Acute on chronic Anemia  -Hgb drop 6.7, 6.6 on repeat.  1 unit pRBC ordered. Aranesp ordered for today.  Metabolic bone disease -calcium at goal, phos mildly elevated, no vitamin D Lung nodule seen on CT angiogram follow-up as outpatient per admit team Nutrition -renal failure  diet as tolerated  Virgina Norfolk, PA-C [facility] 09/05/2023,10:38 AM  LOS: 2 days

## 2023-09-05 NOTE — TOC Progression Note (Signed)
Transition of Care [facility]) - Progression Note    Patient Details  Name: Diamond [name] MRN: [id number] Date of Birth: [birth year]  Transition of Care [facility]) CM/SW Contact  Delilah Shan, LCSWA Phone Number: 09/05/2023, 4:47 PM  Clinical Narrative:     Plan for patient to return to [facility] when medically ready. CSW will continue to follow and assist with patients dc planning needs.   Expected Discharge Plan: Skilled Nursing Facility Barriers to Discharge: Continued Medical Work up  Expected Discharge Plan and Services     Post Acute Care Choice: Skilled Nursing Facility Living arrangements for the past 2 months: Skilled Nursing Facility                                       Social Determinants of Health (SDOH) Interventions SDOH Screenings   Food Insecurity: No Food Insecurity (09/03/2023)  Housing: Low Risk  (09/03/2023)  Transportation Needs: No Transportation Needs (09/03/2023)  Utilities: Not At Risk (09/03/2023)  Depression (PHQ2-9): Low Risk  (03/17/2023)  Social Connections: Moderately Integrated (07/20/2022)  Tobacco Use: Low Risk  (09/02/2023)    Readmission Risk Interventions    06/30/2022    1:12 PM 06/17/2022    1:02 PM 01/22/2022   12:19 PM  Readmission Risk Prevention Plan  Transportation Screening Complete Complete Complete  Medication Review Oceanographer) Complete Complete Complete  PCP or Specialist appointment within 3-5 days of discharge Complete Complete Complete  HRI or Home Care Consult Complete Complete Complete  SW Recovery Care/Counseling Consult Complete Complete Complete  Palliative Care Screening Not Applicable Not Applicable Not Applicable  Skilled Nursing Facility Not Applicable Complete Not Applicable

## 2023-09-05 NOTE — Progress Notes (Signed)
Received patient in bed to unit.  Alert and oriented.  Informed consent signed and in chart.   TX duration:3.5  Patient tolerated well.  Transported back to the room  Alert, without acute distress.  Hand-off given to patient's nurse.   Access used: L Upper Arm fistula Access issues: none  Total UF removed: 1L Medication(s) given: none   09/05/23 1300  Vitals  Temp 98.2 F (36.8 C)  BP (!) 172/90  MAP (mmHg) 111  Pulse Rate (!) 58  ECG Heart Rate (!) 59  Resp 13  MEWS COLOR  MEWS Score Color Green  Oxygen Therapy  SpO2 100 %  O2 Device Room Air  MEWS Score  MEWS Temp 0  MEWS Systolic 0  MEWS Pulse 0  MEWS RR 1  MEWS LOC 0  MEWS Score 1     Stacie Glaze LPN [facility]

## 2023-09-05 NOTE — Progress Notes (Signed)
Daily Progress Note Intern Pager: [phone]  Patient name: Diamond [name] Medical record number: [id number] Date of birth: Apr 17, 1951 Age: 72 y.o. Gender: female  Primary Care Provider: [facility] Consultants: Nephrology Code Status: DNR/DNI  Pt Overview and Major Events to Date:  9/28: Admitted to FMTS 9/29: Receiving HD  Assessment and Plan:  Diamond [name] is a 72 year old female with history of ESRD on HD, Afib, Hep C, HTN presented for severe abdominal pain with radiation to back and N/V found to have elevated lipase of 539 and was admitted for acute pancreatitis. Though improved from pancreatitis standpoint, patient now has decreased hemoglobin of 6.6 requiring further workup. Assessment & Plan Anemia, unspecified Patient noted to have Hgb 8.2 on admission, now 6.6 this morning. Patient tends to reside around Hgb 8-9. MCV 97.6 suggests bleeding vs chronic disease etiology.  No obvious signs of bleeding at this time. - Following HD, repeat CBC - Transfuse for Hgb <7 - Ordered LDH, haptoglobin, reticulocytes, fractionated bilirubin, and peripheral blood smear for hemolysis workup - Ordered iron panel and ferritin  - CTM for signs of bleeding - AM CBC  ESRD on hemodialysis [facility]) Patient received HD this morning.  - Nephrology aware, greatly appreciate recs  - Avoiding nephrotoxic medications - Dosing sevelemer per pharmacy recommendations, appreciate their assistance Pancreatitis Resolving. Tolerating PO. No abdominal pain. Benign abdominal exam.   Chronic and Stable Issues: HTN: Metoprolol tartate 50 mg, Hydralizine 50 mg (for SBP>170) Hypothyroidism: levothyroxine 25 PAF: metoprolol 50 mg, Amiodarone 200 mg, holding eliquis iso bleed risk Asthma: albuterol, breo ellipta Chronic pain: gabapentin 300 mg on dialysis days, lidocaine patches  FEN/GI: Renal diet PPx: SCDs Dispo: [employer] pending stability of Hgb   Subjective:   Patient is doing well this morning without pain.  No nausea or vomiting.  No chest pain, SOB, HA, or vision changes.  Patient has not had a bowel movement since she has been in the hospital, but feels that she may soon.  Patient has not noticed new skin bruising or hematuria.  Objective: Temp:  [98.2 F (36.8 C)-98.8 F (37.1 C)] 98.2 F (36.8 C) (09/30 1300) Pulse Rate:  [2-67] 67 (09/30 1429) Resp:  [9-20] 13 (09/30 1321) BP: (120-176)/(63-102) 161/102 (09/30 1429) SpO2:  [99 %-100 %] 100 % (09/30 1321) Weight:  [50.4 kg-51.6 kg] 50.4 kg (09/30 1324) Physical Exam: General: Well-appearing, receiving dialysis Cardiovascular: Normal S1/S2. No extra heart sounds.  Warm and well-perfused. Respiratory: Breathing comfortably on room air.  Appropriate aeration bilaterally.  No increased work of breathing. Abdomen: Soft, nontender, nondistended. Extremities: Warm, dry.  Laboratory: Most recent CBC Lab Results  Component Value Date   WBC 3.6 (L) 09/05/2023   HGB 6.6 (LL) 09/05/2023   HCT 19.4 (L) 09/05/2023   MCV 97.0 09/05/2023   PLT 124 (L) 09/05/2023   Most recent BMP    Latest Ref Rng & Units 09/05/2023    4:58 AM  BMP  Glucose 70 - 99 mg/dL 98   BUN 8 - 23 mg/dL 40   Creatinine 1.47 - 1.00 mg/dL 8.29   Sodium 562 - 130 mmol/L 137   Potassium 3.5 - 5.1 mmol/L 3.7   Chloride 98 - 111 mmol/L 94   CO2 22 - 32 mmol/L 28   Calcium 8.9 - 10.3 mg/dL 8.9    Ivery Quale, MD 09/05/2023, 2:56 PM  PGY-1, [facility] FPTS Intern pager: [phone], text pages welcome Secure chat group  [facility] Teaching Service

## 2023-09-05 NOTE — Assessment & Plan Note (Addendum)
Patient received HD this morning.  - Nephrology aware, greatly appreciate recs  - Avoiding nephrotoxic medications - Dosing sevelemer per pharmacy recommendations, appreciate their assistance

## 2023-09-05 NOTE — Assessment & Plan Note (Addendum)
Patient noted to have Hgb 8.2 on admission, now 6.6 this morning. Patient tends to reside around Hgb 8-9. MCV 97.6 suggests bleeding vs chronic disease etiology.  No obvious signs of bleeding at this time. - Following HD, repeat CBC - Transfuse for Hgb <7 - Ordered LDH, haptoglobin, reticulocytes, fractionated bilirubin, and peripheral blood smear for hemolysis workup - Ordered iron panel and ferritin  - CTM for signs of bleeding - AM CBC

## 2023-09-05 NOTE — Progress Notes (Signed)
Patient is refusing blood transfusion tonight and states "I don't feel like doing all of that tonight. I'm tired of meds and being poked." RN educated patient on the need for transfusion. Patient states she would like to have her labs rechecked in the morning and have the transfusion tomorrow. Dr. Barbaraann Faster made aware of patient refusing the transfusion.

## 2023-09-05 NOTE — Progress Notes (Signed)
Spoke with patient regarding PIV placement. Patient refused PIV start at this time. Discussed with nurse and notified patient refused PIV placement. RN VU. Tomasita Morrow, RN VAST

## 2023-09-05 NOTE — Assessment & Plan Note (Addendum)
Resolving. Tolerating PO. No abdominal pain. Benign abdominal exam.

## 2023-09-06 ENCOUNTER — Ambulatory Visit: Payer: Medicare (Managed Care) | Admitting: Internal Medicine

## 2023-09-06 DIAGNOSIS — D649 Anemia, unspecified: Secondary | ICD-10-CM | POA: Diagnosis not present

## 2023-09-06 LAB — CBC
HCT: 18.3 % — ABNORMAL LOW (ref 36.0–46.0)
Hemoglobin: 6.2 g/dL — CL (ref 12.0–15.0)
MCH: 32.5 pg (ref 26.0–34.0)
MCHC: 33.9 g/dL (ref 30.0–36.0)
MCV: 95.8 fL (ref 80.0–100.0)
Platelets: 113 10*3/uL — ABNORMAL LOW (ref 150–400)
RBC: 1.91 MIL/uL — ABNORMAL LOW (ref 3.87–5.11)
RDW: 16.6 % — ABNORMAL HIGH (ref 11.5–15.5)
WBC: 3.4 10*3/uL — ABNORMAL LOW (ref 4.0–10.5)

## 2023-09-06 LAB — HEMOGLOBIN AND HEMATOCRIT, BLOOD
HCT: 23.1 % — ABNORMAL LOW (ref 36.0–46.0)
Hemoglobin: 8.1 g/dL — ABNORMAL LOW (ref 12.0–15.0)

## 2023-09-06 LAB — MRSA NEXT GEN BY PCR, NASAL: MRSA by PCR Next Gen: NOT DETECTED

## 2023-09-06 LAB — HEPATITIS B SURFACE ANTIBODY, QUANTITATIVE: Hep B S AB Quant (Post): 11.4 m[IU]/mL

## 2023-09-06 LAB — HAPTOGLOBIN: Haptoglobin: 98 mg/dL (ref 42–346)

## 2023-09-06 MED ORDER — CHLORHEXIDINE GLUCONATE CLOTH 2 % EX PADS
6.0000 | MEDICATED_PAD | Freq: Every day | CUTANEOUS | Status: DC
  Administered 2023-09-07: 6 via TOPICAL

## 2023-09-06 MED ORDER — SEVELAMER CARBONATE 800 MG PO TABS
800.0000 mg | ORAL_TABLET | Freq: Three times a day (TID) | ORAL | Status: DC
  Administered 2023-09-06 – 2023-09-08 (×7): 800 mg via ORAL
  Filled 2023-09-06 (×7): qty 1

## 2023-09-06 NOTE — Progress Notes (Signed)
Physical Therapy Treatment Patient Details Name: Diamond [name] MRN: [id number] DOB: 06/16/1951 Today's Date: 09/06/2023   History of Present Illness Pt admitted 9/27 with acute pancreatitis. 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 Pt requiring mod assist from behind to bring trunk upright into extension. Worked on holding that position for short periods. Pf fatigued quickly.     If plan is discharge home, recommend the following:     Can travel by private vehicle     No  Equipment Recommendations  None recommended by PT    Recommendations for Other Services       Precautions / Restrictions Precautions Precautions: Fall Restrictions Weight Bearing Restrictions: No     Mobility  Bed Mobility Overal bed mobility: Needs Assistance Bed Mobility: Sidelying to Sit, Sit to Sidelying   Sidelying to sit: Mod assist, HOB elevated, Used rails     Sit to sidelying: Min assist General bed mobility comments: Assist to elevate trunk into sitting and bring hips to EOB. Assist to bring legs back up into bed.    Transfers                        Ambulation/Gait                   Stairs             Wheelchair Mobility     Tilt Bed    Modified Rankin (Stroke Patients Only)       Balance Overall balance assessment: Needs assistance Sitting-balance support: Bilateral upper extremity supported, Feet unsupported Sitting balance-Leahy Scale: Poor Sitting balance - Comments: Pt with flexed posture sitting EOB requiring min to mod assist to achieve more upright posture. Postural control: Right lateral lean, Other (comment)                                  Cognition Arousal: Alert Behavior During Therapy: Flat affect Overall Cognitive Status: No  family/caregiver present to determine baseline cognitive functioning                                          Exercises      General Comments        Pertinent Vitals/Pain Pain Assessment Pain Assessment: No/denies pain    Home Living                          Prior Function            PT Goals (current goals can now be found in the care plan section) Progress towards PT goals: Not progressing toward goals - comment    Frequency    Min 1X/week      PT Plan      Co-evaluation              AM-PAC PT "6 Clicks" Mobility   Outcome Measure  Help needed turning from your back to your side while in a flat bed without using bedrails?: A Little Help needed moving from lying on your back to sitting on the side of a flat bed  without using bedrails?: A Lot Help needed moving to and from a bed to a chair (including a wheelchair)?: Total Help needed standing up from a chair using your arms (e.g., wheelchair or bedside chair)?: Total Help needed to walk in hospital room?: Total Help needed climbing 3-5 steps with a railing? : Total 6 Click Score: 9    End of Session   Activity Tolerance: Patient limited by fatigue Patient left: in bed;with call bell/phone within reach;with bed alarm set Nurse Communication: Mobility status PT Visit Diagnosis: Muscle weakness (generalized) (M62.81);Other abnormalities of gait and mobility (R26.89)     Time: 1430-1447 PT Time Calculation (min) (ACUTE ONLY): 17 min  Charges:    $Therapeutic Activity: 8-22 mins PT General Charges $$ ACUTE PT VISIT: 1 Visit                     [facility] PT Acute Rehabilitation Services Office [phone]    Angelina Ok [facility] 09/06/2023, 3:51 PM

## 2023-09-06 NOTE — Progress Notes (Signed)
Patient states she would like to wait for her son to be here to start the transfusion. RN attempted to reach son, Mariana Kaufman but he did not answer. A voicemail was left. Providers are aware.

## 2023-09-06 NOTE — Progress Notes (Signed)
Patients hgb 6.2 this am. She is agreeable to the transfusion. Dr. Fatima Blank was paged and is aware.

## 2023-09-06 NOTE — Progress Notes (Signed)
Daily Progress Note Intern Pager: [phone]  Patient name: Diamond [name] Medical record number: [id number] Date of birth: March 26, 1951 Age: 72 y.o. Gender: female  Primary Care Provider: [facility] Consultants: Nephrology Code Status: DNR/DNI  Pt Overview and Major Events to Date:  9/28: Admitted to FMTS 9/29: Receiving HD  Assessment and Plan: Brayah Haenel is a 72 year old female with history of ESRD on HD, Afib, Hep C, HTN presented for severe abdominal pain with radiation to back and N/V found to have elevated lipase of 539 and was admitted for acute pancreatitis. Though improved from pancreatitis standpoint, patient was found to have decreased hemoglobin and is now s/p 1U PRBCs awaiting repeat H/H.  Have attempted multiple times to contact patient's children to discuss her anemia; will continue to try to contact.  Would like to discuss consideration of GI workup for bleeding to explain her recent anemia, versus allowing her to return to [place] at her previous level of care.  Concern that patient will continue to bleed requiring repeat admission, however also recognized that patient may not wish to undergo such workup.  In addition, patient has a history of A-fib previously on Eliquis.  Would like to discuss option of restarting or holding this medicine with understanding that patient is at risk for possible clot or worsening bleed depending on decision. Assessment & Plan Anemia, unspecified Hgb 8.2 on admission, now 6.2 this morning. Patient baseline around Hgb 8-9. No obvious signs of bleeding at this time.  Hemolysis workup normal.  Ferritin elevated to 1042. -S/p 1U PRBCs; repeat H/H - Transfuse for Hgb <7 - CTM for signs of bleeding - Given Aranesp yesterday (9/30) per Nephro; continue weekly ESRD on hemodialysis [facility]) Patient received HD yesterday 9/30. Typically gets HD M/W/F.  - Nephrology aware, greatly appreciate recs  - Avoiding  nephrotoxic medications - Dosing sevelemer per pharmacy recommendations, appreciate their assistance Pancreatitis Resolving. Tolerating PO very well. No abdominal pain. Benign abdominal exam.    Chronic and Stable Issues:  HTN: Metoprolol tartate 50 mg, Hydralizine 50 mg (for SBP>170) Hypothyroidism: levothyroxine 25 PAF: metoprolol 50 mg, Amiodarone 200 mg, holding eliquis iso bleed risk Asthma: albuterol, breo ellipta Chronic pain: gabapentin 300 mg on dialysis days, lidocaine patches   FEN/GI: Renal diet; Boost supplement 3x daily PPx: SCDs Dispo: [employer] pending stability of Hgb, consideration of GI workup.  Attempting to contact family for discussion.  Subjective:  Seen this morning sitting up in bed eating breakfast.  Very pleasant and states she has no pain.  Has a good appetite.  Denies headache, dizziness, abdominal pain.  Denies any bleeding.  Objective: Temp:  [97.7 F (36.5 C)-99.2 F (37.3 C)] 97.7 F (36.5 C) (10/01 1150) Pulse Rate:  [2-69] 69 (10/01 1150) Resp:  [13-16] 15 (10/01 1035) BP: (128-161)/(74-102) 134/74 (10/01 1150) SpO2:  [95 %-100 %] 100 % (10/01 0802) Physical Exam: General: Pleasant, NAD.  Sitting up in bed eating breakfast. Cardiovascular: RRR Respiratory: CTA bilaterally, on room air Abdomen: Normoactive bowel sounds, soft, nontender, nondistended. Extremities: Skin is warm and dry.  Moves all extremities equally.  No lower extremity edema bilaterally.  Laboratory: Most recent CBC Lab Results  Component Value Date   WBC 3.4 (L) 09/06/2023   HGB 8.1 (L) 09/06/2023   HCT 23.1 (L) 09/06/2023   MCV 95.8 09/06/2023   PLT 113 (L) 09/06/2023   Most recent BMP    Latest Ref Rng & Units 09/05/2023  4:58 AM  BMP  Glucose 70 - 99 mg/dL 98   BUN 8 - 23 mg/dL 40   Creatinine 1.61 - 1.00 mg/dL 0.96   Sodium 045 - 409 mmol/L 137   Potassium 3.5 - 5.1 mmol/L 3.7   Chloride 98 - 111 mmol/L 94   CO2 22 - 32 mmol/L 28   Calcium 8.9 - 10.3  mg/dL 8.9     ADDENDUM:  - Posttransfusion H/H shows hemoglobin improved to 8.1, closer to patient's baseline.   Cyndia Skeeters, DO 09/06/2023, 2:23 PM  PGY-1, [facility] Family Medicine FPTS Intern pager: [phone], text pages welcome Secure chat group [facility] Teaching Service

## 2023-09-06 NOTE — Plan of Care (Signed)
  Problem: Education: Goal: Knowledge of General Education information will improve Description: Including pain rating scale, medication(s)/side effects and non-pharmacologic comfort measures Outcome: Progressing   Problem: Health Behavior/Discharge Planning: Goal: Ability to manage health-related needs will improve Outcome: Progressing   Problem: Clinical Measurements: Goal: Ability to maintain clinical measurements within normal limits will improve Outcome: Progressing Goal: Will remain free from infection Outcome: Progressing Goal: Diagnostic test results will improve Outcome: Progressing Goal: Respiratory complications will improve Outcome: Progressing Goal: Cardiovascular complication will be avoided Outcome: Progressing   Problem: Activity: Goal: Risk for activity intolerance will decrease Outcome: Progressing   Problem: Nutrition: Goal: Adequate nutrition will be maintained Outcome: Progressing   Problem: Coping: Goal: Level of anxiety will decrease Outcome: Progressing   Problem: Elimination: Goal: Will not experience complications related to bowel motility Outcome: Progressing Goal: Will not experience complications related to urinary retention Outcome: Progressing   Problem: Pain Managment: Goal: General experience of comfort will improve Outcome: Progressing   Problem: Safety: Goal: Ability to remain free from injury will improve Outcome: Progressing   Problem: Skin Integrity: Goal: Risk for impaired skin integrity will decrease Outcome: Progressing   Problem: Education: Goal: Knowledge of disease and its progression will improve Outcome: Progressing   Problem: Fluid Volume: Goal: Compliance with measures to maintain balanced fluid volume will improve Outcome: Progressing   Problem: Health Behavior/Discharge Planning: Goal: Ability to manage health-related needs will improve Outcome: Progressing   Problem: Nutritional: Goal: Ability to make  healthy dietary choices will improve Outcome: Progressing   Problem: Clinical Measurements: Goal: Complications related to the disease process, condition or treatment will be avoided or minimized Outcome: Progressing

## 2023-09-06 NOTE — Assessment & Plan Note (Addendum)
Patient received HD yesterday 9/30. Typically gets HD M/W/F.  - Nephrology aware, greatly appreciate recs  - Avoiding nephrotoxic medications - Dosing sevelemer per pharmacy recommendations, appreciate their assistance

## 2023-09-06 NOTE — Progress Notes (Signed)
[facility] Progress Note   Subjective:   Patient seen and examined at bedside. Hgb drop further this AM, currently with 1 unit pRBC infusing.  Refused transfusion yesterday.  Denies visible blood loss.  Reports BM yesterday with no melena or hematochezia.  Denies CP, SOB, abdominal pain and n/v/d.  States she is feeling ok.    Objective Vitals:   09/06/23 0730 09/06/23 0802 09/06/23 1035 09/06/23 1150  BP:  135/76 (!) 160/82 134/74  Pulse: 65  65 69  Resp: 15 14 15    Temp: 99 F (37.2 C) 99.2 F (37.3 C) 98.7 F (37.1 C) 97.7 F (36.5 C)  TempSrc: Oral Oral Oral Oral  SpO2: 100% 100%    Weight:      Height:       Physical Exam General:alert, elderly, thin female in NAD Heart:RRR Lungs:CTAB, nml WOB on RA Abdomen:soft, NTND Extremities:no LE edema Dialysis Access: LU AVF +b/t   Filed Weights   09/02/23 1211 09/05/23 0849 09/05/23 1324  Weight: 59 kg 51.6 kg 50.4 kg    Intake/Output Summary (Last 24 hours) at 09/06/2023 1155 Last data filed at 09/06/2023 1100 Gross per 24 hour  Intake 1296 ml  Output 1300 ml  Net -4 ml    Additional Objective Labs: Basic Metabolic Panel: Recent Labs  Lab 09/02/23 1256 09/02/23 2202 09/03/23 0025 09/03/23 0356 09/05/23 0458  NA 135  --   --  133* 137  K 5.8*   < > 4.6 4.9 3.7  CL 93*  --   --  95* 94*  CO2 24  --   --  25 28  GLUCOSE 80  --   --  108* 98  BUN 65*  --   --  69* 40*  CREATININE 8.43*  --   --  9.42* 5.77*  CALCIUM 9.4  --   --  9.2 8.9  PHOS  --   --   --   --  5.6*   < > = values in this interval not displayed.   Liver Function Tests: Recent Labs  Lab 09/02/23 1256 09/05/23 0458 09/05/23 1832  AST 24  --   --   ALT 19  --   --   ALKPHOS 85  --   --   BILITOT 0.8  --  0.3  PROT 6.5  --   --   ALBUMIN 2.8* 2.3*  --    Recent Labs  Lab 09/02/23 1256  LIPASE 534*   CBC: Recent Labs  Lab 09/02/23 1256 09/03/23 0356 09/05/23 0458 09/05/23 0830 09/05/23 1832 09/06/23 0503   WBC 5.0 5.4 3.5* 3.6* 3.8* 3.4*  NEUTROABS 2.7  --   --   --  2.3  --   HGB 8.2* 8.4* 6.7* 6.6* 7.0* 6.2*  HCT 24.1* 25.2* 19.3* 19.4* 20.2* 18.3*  MCV 97.6 98.4 99.0 97.0 98.5 95.8  PLT 172 168 120* 124* 127* 113*   CBG: Recent Labs  Lab 09/03/23 2140  GLUCAP 79   Iron Studies:  Recent Labs    09/05/23 1832  FERRITIN 1,042*   Lab Results  Component Value Date   INR 1.2 01/25/2023   INR 1.0 10/05/2022   INR 1.1 09/16/2022   Studies/Results: No results found.  Medications:   sodium chloride   Intravenous Once   sodium chloride   Intravenous Once   amiodarone  200 mg Oral Daily   darbepoetin (ARANESP) injection - NON-DIALYSIS  200 mcg Subcutaneous Q Mon-1800   feeding  supplement  1 Container Oral TID BM   fluticasone furoate-vilanterol  1 puff Inhalation Daily   gabapentin  300 mg Oral Q M,W,F-2000   hydrALAZINE  50 mg Oral 2 times per day on Monday Wednesday Friday   levothyroxine  25 mcg Oral QAC breakfast   lidocaine  1 patch Transdermal Q24H   metoprolol tartrate  50 mg Oral BID   pantoprazole  40 mg Oral Daily   QUEtiapine  25 mg Oral Once    Dialysis Orders: [facility] on MWF 3.5 hours EDW 51 kg 2K, 2 Ca bath, left upper arm AV fistula, Mircera 200 every 2 weeks not given since 08/15/23  Venofer 50 mg weekly,  no heparin no vitamin D      Assessment/Plan ESRD- HD MWF - HD tomorrow per regular schedule Mild hyperkalemia - K 5.8 on admission improved with medical management, k 3.7 yesterday.  Pancytopenia - WBC, Hgb, plt dropping.  Has had similar labs in the past. Acute recurrent pancreatitis- per admit team, this morning symptoms resolved Hypertension/volume  -BP variable, continue home meds.  Does not appear volume overloaded.  Close to dry weight. UF as tolerated.  Acute on chronic Anemia  -Hgb drop 6.7 yesterday, refused 1 unit of blood ordered.  Further drop to 6.2 today, agreed to 1 unit pRBC currently infusing.  Aranesp given yesterday.   Metabolic bone disease -calcium at goal, phos mildly elevated, no VDRA. Lung nodule seen on CT angiogram follow-up as outpatient per admit team Nutrition -renal failure diet as tolerated  Diamond Norfolk, PA-C [facility] 09/06/2023,11:55 AM  LOS: 3 days

## 2023-09-06 NOTE — TOC Progression Note (Addendum)
Transition of Care [facility]) - Progression Note    Patient Details  Name: Diamond [name] MRN: [id number] Date of Birth: [birth year]  Transition of Care [facility]) CM/SW Contact  Delilah Shan, LCSWA Phone Number: 09/06/2023, 1:25 PM  Clinical Narrative:     Clair Gulling with [facility] confirmed they can accept patient back to facility when medically ready. CSW spoke with Marylu Lund with [facility] who confirmed they will provide transportation at dc to facility. Marylu Lund tele# [phone]. CSW will continue to follow and assist with patients dc planning needs.  Expected Discharge Plan: Skilled Nursing Facility Barriers to Discharge: Continued Medical Work up  Expected Discharge Plan and Services     Post Acute Care Choice: Skilled Nursing Facility Living arrangements for the past 2 months: Skilled Nursing Facility                                       Social Determinants of Health (SDOH) Interventions SDOH Screenings   Food Insecurity: No Food Insecurity (09/03/2023)  Housing: Low Risk  (09/03/2023)  Transportation Needs: No Transportation Needs (09/03/2023)  Utilities: Not At Risk (09/03/2023)  Depression (PHQ2-9): Low Risk  (03/17/2023)  Social Connections: Moderately Integrated (07/20/2022)  Tobacco Use: Low Risk  (09/02/2023)    Readmission Risk Interventions    06/30/2022    1:12 PM 06/17/2022    1:02 PM 01/22/2022   12:19 PM  Readmission Risk Prevention Plan  Transportation Screening Complete Complete Complete  Medication Review Oceanographer) Complete Complete Complete  PCP or Specialist appointment within 3-5 days of discharge Complete Complete Complete  HRI or Home Care Consult Complete Complete Complete  SW Recovery Care/Counseling Consult Complete Complete Complete  Palliative Care Screening Not Applicable Not Applicable Not Applicable  Skilled Nursing Facility Not Applicable Complete Not Applicable

## 2023-09-06 NOTE — Assessment & Plan Note (Addendum)
Hgb 8.2 on admission, now 6.2 this morning. Patient baseline around Hgb 8-9. No obvious signs of bleeding at this time.  Hemolysis workup normal.  Ferritin elevated to 1042. -S/p 1U PRBCs; repeat H/H - Transfuse for Hgb <7 - CTM for signs of bleeding - Given Aranesp yesterday (9/30) per Nephro; continue weekly

## 2023-09-06 NOTE — Progress Notes (Signed)
Patients son, Mariana Kaufman returned call and spoke with patient regarding transfusion. Patient is now agreeable and RN will start transfusion.

## 2023-09-06 NOTE — Assessment & Plan Note (Addendum)
Resolving. Tolerating PO very well. No abdominal pain. Benign abdominal exam.

## 2023-09-06 NOTE — Progress Notes (Signed)
Pt receives out-pt HD at [facility] [place] at [facility]. Pt from snf. Will assist as needed.   Olivia Canter Renal Navigator [phone]

## 2023-09-07 DIAGNOSIS — D649 Anemia, unspecified: Secondary | ICD-10-CM | POA: Diagnosis not present

## 2023-09-07 LAB — CBC
HCT: 23.8 % — ABNORMAL LOW (ref 36.0–46.0)
HCT: 23.3 % — ABNORMAL LOW (ref 36.0–46.0)
Hemoglobin: 8.3 g/dL — ABNORMAL LOW (ref 12.0–15.0)
Hemoglobin: 8 g/dL — ABNORMAL LOW (ref 12.0–15.0)
MCH: 33.2 pg (ref 26.0–34.0)
MCH: 32.4 pg (ref 26.0–34.0)
MCHC: 34.9 g/dL (ref 30.0–36.0)
MCHC: 34.3 g/dL (ref 30.0–36.0)
MCV: 95.2 fL (ref 80.0–100.0)
MCV: 94.3 fL (ref 80.0–100.0)
Platelets: 112 10*3/uL — ABNORMAL LOW (ref 150–400)
Platelets: 112 10*3/uL — ABNORMAL LOW (ref 150–400)
RBC: 2.5 MIL/uL — ABNORMAL LOW (ref 3.87–5.11)
RBC: 2.47 MIL/uL — ABNORMAL LOW (ref 3.87–5.11)
RDW: 17.9 % — ABNORMAL HIGH (ref 11.5–15.5)
RDW: 17.6 % — ABNORMAL HIGH (ref 11.5–15.5)
WBC: 4 10*3/uL (ref 4.0–10.5)
WBC: 4.3 10*3/uL (ref 4.0–10.5)
nRBC: 0 % (ref 0.0–0.2)
nRBC: 0 % (ref 0.0–0.2)

## 2023-09-07 LAB — RENAL FUNCTION PANEL
Albumin: 2.3 g/dL — ABNORMAL LOW (ref 3.5–5.0)
Anion gap: 11 (ref 5–15)
BUN: 55 mg/dL — ABNORMAL HIGH (ref 8–23)
CO2: 26 mmol/L (ref 22–32)
Calcium: 9.1 mg/dL (ref 8.9–10.3)
Chloride: 97 mmol/L — ABNORMAL LOW (ref 98–111)
Creatinine, Ser: 6.53 mg/dL — ABNORMAL HIGH (ref 0.44–1.00)
GFR, Estimated: 6 mL/min — ABNORMAL LOW (ref >60)
Glucose, Bld: 108 mg/dL — ABNORMAL HIGH (ref 70–99)
Phosphorus: 4.3 mg/dL (ref 2.5–4.6)
Potassium: 3.5 mmol/L (ref 3.5–5.1)
Sodium: 134 mmol/L — ABNORMAL LOW (ref 135–145)

## 2023-09-07 LAB — TYPE AND SCREEN
ABO/RH(D): O POS
Antibody Screen: NEGATIVE
Unit division: 0

## 2023-09-07 LAB — BPAM RBC
Blood Product Expiration Date: 202410292359
ISSUE DATE / TIME: 202410010642
Unit Type and Rh: 5100

## 2023-09-07 MED ORDER — LIDOCAINE HCL (PF) 1 % IJ SOLN: 5.0000 mL | INTRAMUSCULAR | Status: DC | PRN

## 2023-09-07 MED ORDER — HEPARIN SODIUM (PORCINE) 1000 UNIT/ML DIALYSIS: 1000.0000 [IU] | INTRAMUSCULAR | Status: DC | PRN

## 2023-09-07 MED ORDER — PENTAFLUOROPROP-TETRAFLUOROETH EX AERO: 1.0000 | INHALATION_SPRAY | CUTANEOUS | Status: DC | PRN

## 2023-09-07 MED ORDER — LIDOCAINE-PRILOCAINE 2.5-2.5 % EX CREA: 1.0000 | TOPICAL_CREAM | CUTANEOUS | Status: DC | PRN

## 2023-09-07 MED ORDER — SIMETHICONE 80 MG PO CHEW: 80.0000 mg | CHEWABLE_TABLET | Freq: Four times a day (QID) | ORAL | Status: DC | PRN

## 2023-09-07 MED ORDER — ANTICOAGULANT SODIUM CITRATE 4% (200MG/5ML) IV SOLN: 5.0000 mL | Status: DC | PRN

## 2023-09-07 MED ORDER — ALTEPLASE 2 MG IJ SOLR: 2.0000 mg | Freq: Once | INTRAMUSCULAR | Status: DC | PRN

## 2023-09-07 NOTE — Assessment & Plan Note (Addendum)
Hgb 8.3 this morning. Patient baseline around Hgb 8-9. No obvious signs of bleeding at this time.  - Transfuse for Hgb <7 - CTM for signs of bleeding - Given Aranesp (9/30) per Nephro; continue weekly.

## 2023-09-07 NOTE — TOC Progression Note (Signed)
Transition of Care [facility]) - Progression Note    Patient Details  Name: Diamond [name] MRN: [id number] Date of Birth: [birth year]  Transition of Care [facility]) CM/SW Contact  Delilah Shan, LCSWA Phone Number: 09/07/2023, 3:51 PM  Clinical Narrative:     Patient plans to return back to [facility] when medically ready. CSW will continue to follow and assist with patients dc planning needs.  Expected Discharge Plan: Skilled Nursing Facility Barriers to Discharge: Continued Medical Work up  Expected Discharge Plan and Services     Post Acute Care Choice: Skilled Nursing Facility Living arrangements for the past 2 months: Skilled Nursing Facility                                       Social Determinants of Health (SDOH) Interventions SDOH Screenings   Food Insecurity: No Food Insecurity (09/03/2023)  Housing: Low Risk  (09/03/2023)  Transportation Needs: No Transportation Needs (09/03/2023)  Utilities: Not At Risk (09/03/2023)  Depression (PHQ2-9): Low Risk  (03/17/2023)  Social Connections: Moderately Integrated (07/20/2022)  Tobacco Use: Low Risk  (09/02/2023)    Readmission Risk Interventions    06/30/2022    1:12 PM 06/17/2022    1:02 PM 01/22/2022   12:19 PM  Readmission Risk Prevention Plan  Transportation Screening Complete Complete Complete  Medication Review Oceanographer) Complete Complete Complete  PCP or Specialist appointment within 3-5 days of discharge Complete Complete Complete  HRI or Home Care Consult Complete Complete Complete  SW Recovery Care/Counseling Consult Complete Complete Complete  Palliative Care Screening Not Applicable Not Applicable Not Applicable  Skilled Nursing Facility Not Applicable Complete Not Applicable

## 2023-09-07 NOTE — Assessment & Plan Note (Signed)
Patient received HD 9/30. Typically gets HD M/W/F.  -Plan for HD today. - Nephrology aware, greatly appreciate recs  - Avoiding nephrotoxic medications - Dosing sevelemer per pharmacy recommendations, appreciate their assistance

## 2023-09-07 NOTE — Assessment & Plan Note (Addendum)
Resolved.  Continues to tolerate PO very well. No abdominal pain. Benign abdominal exam.

## 2023-09-07 NOTE — Progress Notes (Signed)
[facility] Progress Note   Subjective:   Patient seen sitting up eating breakfast, Hgb improving, 8.2 from 6.7 after receiving 1 unit pRBC yesterday. Denies s&s of bleeding. Denies SOB, CP, abdominal pain and n/v/d during examination. States she is feeling better than yesterday.   Objective Vitals:   09/06/23 2115 09/07/23 0445 09/07/23 0755 09/07/23 0814  BP: (!) 170/93 (!) 149/78 134/73   Pulse: 60 (!) 59 (!) 58 63  Resp: 18 18 18 18   Temp: 98.3 F (36.8 C) 98.4 F (36.9 C) 98.4 F (36.9 C)   TempSrc: Oral Oral Oral   SpO2: 100% 100% 100%   Weight:      Height:       Physical Exam General:Alert, oriented and cheerful this am.  Heart:HR ranging between 58-60, no mrg Lung: Bilateral lungs clear, nml WOB on RA Abdomen: soft, no mass or ascites.  Extremities: Absent BLE edema Dialysis Access: LU AVF +b/t   Filed Weights   09/02/23 1211 09/05/23 0849 09/05/23 1324  Weight: 59 kg 51.6 kg 50.4 kg    Intake/Output Summary (Last 24 hours) at 09/07/2023 1022 Last data filed at 09/07/2023 0900 Gross per 24 hour  Intake 1056 ml  Output 0 ml  Net 1056 ml    Additional Objective Labs: Basic Metabolic Panel: Recent Labs  Lab 09/02/23 1256 09/02/23 2202 09/03/23 0025 09/03/23 0356 09/05/23 0458  NA 135  --   --  133* 137  K 5.8*   < > 4.6 4.9 3.7  CL 93*  --   --  95* 94*  CO2 24  --   --  25 28  GLUCOSE 80  --   --  108* 98  BUN 65*  --   --  69* 40*  CREATININE 8.43*  --   --  9.42* 5.77*  CALCIUM 9.4  --   --  9.2 8.9  PHOS  --   --   --   --  5.6*   < > = values in this interval not displayed.   Liver Function Tests: Recent Labs  Lab 09/02/23 1256 09/05/23 0458 09/05/23 1832  AST 24  --   --   ALT 19  --   --   ALKPHOS 85  --   --   BILITOT 0.8  --  0.3  PROT 6.5  --   --   ALBUMIN 2.8* 2.3*  --    Recent Labs  Lab 09/02/23 1256  LIPASE 534*   CBC: Recent Labs  Lab 09/02/23 1256 09/03/23 0356 09/05/23 0458 09/05/23 0830  09/05/23 1832 09/06/23 0503 09/06/23 1156 09/07/23 0431  WBC 5.0   < > 3.5* 3.6* 3.8* 3.4*  --  4.0  NEUTROABS 2.7  --   --   --  2.3  --   --   --   HGB 8.2*   < > 6.7* 6.6* 7.0* 6.2* 8.1* 8.3*  HCT 24.1*   < > 19.3* 19.4* 20.2* 18.3* 23.1* 23.8*  MCV 97.6   < > 99.0 97.0 98.5 95.8  --  95.2  PLT 172   < > 120* 124* 127* 113*  --  112*   < > = values in this interval not displayed.   Blood Culture    Component Value Date/Time   SDES BLOOD RIGHT HAND 08/17/2023 1104   SPECREQUEST  08/17/2023 1104    BOTTLES DRAWN AEROBIC AND ANAEROBIC Blood Culture adequate volume   CULT  08/17/2023 1104  NO GROWTH 5 DAYS Performed at [facility], [address], [place], [place] [address]    REPTSTATUS 08/22/2023 FINAL 08/17/2023 1104   CBG: Recent Labs  Lab 09/03/23 2140  GLUCAP 79   Iron Studies:  Recent Labs    09/05/23 1832  FERRITIN 1,042*   Lab Results  Component Value Date   INR 1.2 01/25/2023   INR 1.0 10/05/2022   INR 1.1 09/16/2022   Studies/Results: No results found.  Medications:   sodium chloride   Intravenous Once   sodium chloride   Intravenous Once   amiodarone  200 mg Oral Daily   Chlorhexidine Gluconate Cloth  6 each Topical Q0600   darbepoetin (ARANESP) injection - NON-DIALYSIS  200 mcg Subcutaneous Q Mon-1800   feeding supplement  1 Container Oral TID BM   fluticasone furoate-vilanterol  1 puff Inhalation Daily   gabapentin  300 mg Oral Q M,W,F-2000   hydrALAZINE  50 mg Oral 2 times per day on Monday Wednesday Friday   levothyroxine  25 mcg Oral QAC breakfast   lidocaine  1 patch Transdermal Q24H   metoprolol tartrate  50 mg Oral BID   pantoprazole  40 mg Oral Daily   QUEtiapine  25 mg Oral Once   sevelamer carbonate  800 mg Oral TID WC    Dialysis Orders: [facility] on MWF 3.5 hours EDW 51 kg 2K, 2 Ca bath, left upper arm AV fistula, Mircera 200 every 2 weeks not given since 08/15/23  Venofer 50 mg weekly no heparin no vitamin  D  Assessment/Plan: 1.  ESRD -HD today per regular schedule  2.  Mild hyperkalemia - K 5.8 on admission improved to  k 3.7. 3. Acute on chronic Anemia  -Hgb drop 6.7 after refusing 1 unit of blood ordered, hgb drop to 6.2 yesterday,  agreed to 1 unit pRBC and hgb improved to 8.2 this AM.   4. Pancytopenia - WBC, Hgb, plt improved 5. Acute recurrent pancreatitis- per admit team, symptoms resolved. Pt denies any symptoms.  6. Hypertension/volume  -BP variable, continue home meds.  Monitoring dry weight. UF as tolerated. .  7. Metabolic bone disease -calcium at goal, phos mildly elevated, no VDRA. 8. Lung nodule seen on CT angiogram follow-up as outpatient per admit team 9. Nutrition -renal failure diet as tolerated   Virgina Norfolk, PA-C [facility] 09/07/2023,10:22 AM  LOS: 4 days

## 2023-09-07 NOTE — Progress Notes (Signed)
Per patient request I was able to reach patient's son, Diamond [name], by phone this afternoon to discuss care management options for the patient.  I explained to Diamond [name] that his mother was doing well and recovering from her pancreatitis.  We discussed that on routine lab work she was found to have a low hemoglobin (anemia) and so we gave her a blood transfusion which brought her hemoglobin back up into a range that is more typical for her.  We discussed that she is stable and comfortable at this time, and stating that she would like to return to her living facility, [facility].  I discussed with Diamond [name] that at this time we cannot explain why the patient's hemoglobin was low, though we suspect it is possibly due to bleeding somewhere in her GI system.  She did have colonoscopy and endoscopy in 2023 which did not show any active bleeding at the time.  Because of this recent workup, my suspicion for GI malignancy is low.  However this does not rule out a GI bleed now.  Discussed with Diamond [name] that his mother could undergo further workup with GI here in the hospital, though this would prolong her hospital stay.  We also discussed the option for return to [facility] and monitoring the patient there, with outpatient follow-up with her PCP.  We discussed that this is complicated by her history of atrial fibrillation for which she is on Eliquis, a blood thinner.  If she returns to her living facility without GI workup in the hospital it would be preferable to temporarily hold her Eliquis so as not to encourage further bleeding, and to see how she does with this.  However we also discussed that without her Eliquis there is always the risk of clot caused by her atrial fibrillation.  On our discussion Diamond [name] understands there is no right or wrong decision, it is simply what the patient and family are most comfortable with, and that I will support whichever decision they make.  I encouraged Diamond [name] to share this  information with other family members to play an active role in helping the patient make medical decisions, per her request when I spoke with her earlier this morning.  Diamond [name] requested that I publish the details of our conversation in this note so that he may accurately share this information with family members; he has access to his mother's MyChart for this purpose. All questions answered.  I will reach back out to [facility] tomorrow (10/3) to discuss the family's thoughts and wishes, and answer any other questions that may have arisen in that time.  Cyndia Skeeters, DO [facility], PGY-1 09/07/23 1:33 PM

## 2023-09-07 NOTE — Plan of Care (Signed)
  Problem: Education: Goal: Knowledge of General Education information will improve Description: Including pain rating scale, medication(s)/side effects and non-pharmacologic comfort measures Outcome: Progressing   Problem: Health Behavior/Discharge Planning: Goal: Ability to manage health-related needs will improve Outcome: Progressing   Problem: Clinical Measurements: Goal: Ability to maintain clinical measurements within normal limits will improve Outcome: Progressing Goal: Will remain free from infection Outcome: Progressing Goal: Diagnostic test results will improve Outcome: Progressing Goal: Respiratory complications will improve Outcome: Progressing Goal: Cardiovascular complication will be avoided Outcome: Progressing   Problem: Activity: Goal: Risk for activity intolerance will decrease Outcome: Progressing   Problem: Nutrition: Goal: Adequate nutrition will be maintained Outcome: Progressing   Problem: Coping: Goal: Level of anxiety will decrease Outcome: Progressing   Problem: Elimination: Goal: Will not experience complications related to bowel motility Outcome: Progressing Goal: Will not experience complications related to urinary retention Outcome: Progressing   Problem: Pain Managment: Goal: General experience of comfort will improve Outcome: Progressing   Problem: Safety: Goal: Ability to remain free from injury will improve Outcome: Progressing   Problem: Skin Integrity: Goal: Risk for impaired skin integrity will decrease Outcome: Progressing   Problem: Education: Goal: Knowledge of disease and its progression will improve Outcome: Progressing   Problem: Fluid Volume: Goal: Compliance with measures to maintain balanced fluid volume will improve Outcome: Progressing   Problem: Health Behavior/Discharge Planning: Goal: Ability to manage health-related needs will improve Outcome: Progressing   Problem: Nutritional: Goal: Ability to make  healthy dietary choices will improve Outcome: Progressing   Problem: Clinical Measurements: Goal: Complications related to the disease process, condition or treatment will be avoided or minimized Outcome: Progressing

## 2023-09-07 NOTE — Progress Notes (Signed)
Daily Progress Note Intern Pager: [phone]  Patient name: Diamond [name] Medical record number: [id number] Date of birth: [birth year] Age: 72 y.o. Gender: female  Primary Care Provider: [facility] Consultants: Nephrology Code Status: DNR/DNI   Pt Overview and Major Events to Date:  9/28: Admitted to [facility] 9/29: Receiving HD 10/1: Given 1U PRBCs   Assessment and Plan: Diamond [name] is a 72 year old female with history of ESRD on HD, Afib, Hep C, HTN presented for severe abdominal pain with radiation to back and N/V found to have elevated lipase of 539 and was admitted for acute pancreatitis. Though improved from pancreatitis standpoint, patient was found to have decreased hemoglobin and is now s/p 1U PRBCs with Hgb improved to 8.3.   Will contact family per patient request to discuss disposition option versus further workup for possible occult bleeding in the hospital. Assessment & Plan Anemia, unspecified Hgb 8.3 this morning. Patient baseline around Hgb 8-9. No obvious signs of bleeding at this time.  - Transfuse for Hgb <7 - CTM for signs of bleeding - Given Aranesp (9/30) per Nephro; continue weekly. ESRD on hemodialysis [facility]) Patient received HD 9/30. Typically gets HD M/W/F.  -Plan for HD today. - Nephrology aware, greatly appreciate recs  - Avoiding nephrotoxic medications - Dosing sevelemer per pharmacy recommendations, appreciate their assistance Pancreatitis Resolved.  Continues to tolerate PO very well. No abdominal pain. Benign abdominal exam.    Chronic and Stable Issues:  HTN: Metoprolol tartate 50 mg, Hydralizine 50 mg (for SBP>170) Hypothyroidism: levothyroxine 25 PAF: metoprolol 50 mg, Amiodarone 200 mg, holding eliquis iso bleed risk Asthma: albuterol, breo ellipta Chronic pain: gabapentin 300 mg on dialysis days, lidocaine patches   FEN/GI: Renal diet; Boost supplement 3x daily PPx: SCDs Dispo: [facility] pending  stability of Hgb, consideration of GI workup.  Will attempt to contact family for discussion.  Subjective:  Patient is well-appearing and in a good mood this morning.  She denies any pain or discomfort, denies headache, dizziness.  She reports a good appetite.  I have had discussion with her about potential occult GI bleed as the source of her low hemoglobin.  I have discussed that this may be worked up in hospital or in the outpatient setting, and that the picture is complicated by her A-fib treated with Eliquis.  Discussed with patient that holding Eliquis risks possible clot related to A-fib, while continuing Eliquis risks further bleeding.  She asks that I contact her family to discuss options.  She does state that she would like to return to her living facility.  Objective: Temp:  [97.7 F (36.5 C)-98.7 F (37.1 C)] 98.4 F (36.9 C) (10/02 0755) Pulse Rate:  [58-69] 58 (10/02 0755) Resp:  [15-18] 18 (10/02 0755) BP: (134-170)/(73-93) 134/73 (10/02 0755) SpO2:  [100 %] 100 % (10/02 0755) Physical Exam: General: Well-appearing, alert and oriented, pleasant, no acute distress Cardiovascular: RRR, no murmurs Respiratory: CTA bilaterally, no increased work of breathing.  On room air. Abdomen: Normoactive bowel sounds, soft, nondistended, nontender to palpation.  No masses. Extremities: Skin is warm and dry.  Moves all extremities equally.  Laboratory: Most recent CBC Lab Results  Component Value Date   WBC 4.0 09/07/2023   HGB 8.3 (L) 09/07/2023   HCT 23.8 (L) 09/07/2023   MCV 95.2 09/07/2023   PLT 112 (L) 09/07/2023   Most recent BMP    Latest Ref Rng & Units 09/05/2023    4:58 AM  BMP  Glucose 70 - 99 mg/dL 98   BUN 8 - 23 mg/dL 40   Creatinine 4.54 - 1.00 mg/dL 0.98   Sodium 119 - 147 mmol/L 137   Potassium 3.5 - 5.1 mmol/L 3.7   Chloride 98 - 111 mmol/L 94   CO2 22 - 32 mmol/L 28   Calcium 8.9 - 10.3 mg/dL 8.9     Cyndia Skeeters, DO 09/07/2023, 8:06 AM  PGY-1, [facility] FPTS Intern pager: [phone], text pages welcome Secure chat group [facility] Teaching Service

## 2023-09-08 ENCOUNTER — Ambulatory Visit: Payer: Medicare (Managed Care) | Admitting: Internal Medicine

## 2023-09-08 LAB — CBC
HCT: 24 % — ABNORMAL LOW (ref 36.0–46.0)
Hemoglobin: 8.3 g/dL — ABNORMAL LOW (ref 12.0–15.0)
MCH: 33.1 pg (ref 26.0–34.0)
MCHC: 34.6 g/dL (ref 30.0–36.0)
MCV: 95.6 fL (ref 80.0–100.0)
Platelets: 120 10*3/uL — ABNORMAL LOW (ref 150–400)
RBC: 2.51 MIL/uL — ABNORMAL LOW (ref 3.87–5.11)
RDW: 17.2 % — ABNORMAL HIGH (ref 11.5–15.5)
WBC: 3.8 10*3/uL — ABNORMAL LOW (ref 4.0–10.5)
nRBC: 0 % (ref 0.0–0.2)

## 2023-09-08 MED ORDER — PNEUMOCOCCAL 20-VAL CONJ VACC 0.5 ML IM SUSY: 0.5000 mL | PREFILLED_SYRINGE | INTRAMUSCULAR | Status: DC

## 2023-09-08 MED ORDER — DARBEPOETIN ALFA 200 MCG/0.4ML IJ SOSY: 200.0000 ug | PREFILLED_SYRINGE | INTRAMUSCULAR | Status: AC

## 2023-09-08 MED ORDER — CHLORHEXIDINE GLUCONATE CLOTH 2 % EX PADS: 6.0000 | MEDICATED_PAD | Freq: Every day | CUTANEOUS | Status: DC

## 2023-09-08 NOTE — Plan of Care (Signed)
  Problem: Clinical Measurements: Goal: Ability to maintain clinical measurements within normal limits will improve Outcome: Progressing Goal: Respiratory complications will improve Outcome: Progressing Goal: Cardiovascular complication will be avoided Outcome: Progressing   Problem: Pain Managment: Goal: General experience of comfort will improve Outcome: Progressing   Problem: Safety: Goal: Ability to remain free from injury will improve Outcome: Progressing   Problem: Health Behavior/Discharge Planning: Goal: Ability to manage health-related needs will improve Outcome: Progressing   Problem: Nutritional: Goal: Ability to make healthy dietary choices will improve Outcome: Progressing   Problem: Clinical Measurements: Goal: Complications related to the disease process, condition or treatment will be avoided or minimized Outcome: Progressing

## 2023-09-08 NOTE — Progress Notes (Signed)
Report called to [facility], [facility] LPN. Patient is waiting transportation. Will continue to monitor patient.

## 2023-09-08 NOTE — TOC Transition Note (Addendum)
Transition of Care [facility]) - CM/SW Discharge Note   Patient Details  Name: Diamond [name] MRN: [id number] Date of Birth: Jun 17, 1951  Transition of Care [facility]) CM/SW Contact:  Delilah Shan, LCSWA Phone Number: 09/08/2023, 2:35 PM   Clinical Narrative:     Patient will DC to: [facility]   Anticipated DC date: 09/08/2023  Family notified: Loss adjuster, chartered by: Sharin Mons  ?  Per MD patient ready for DC to [facility] . RN, patient, patient's family, Marylu Lund with [facility],Tracy Renal Navigator,and facility notified of DC. Discharge Summary sent to facility. RN given number for report [phone] [id number]. DC packet on chart.DNR signed by MD attached to patients DC packet. Ambulance transport requested for patient.  CSW signing off.   Final next level of care: Skilled Nursing Facility Barriers to Discharge: No Barriers Identified   Patient Goals and CMS Choice CMS Medicare.gov Compare Post Acute Care list provided to:: Patient Choice offered to / list presented to : Patient  Discharge Placement                Patient chooses bed at: [facility] Patient to be transferred to facility by: [facility] Name of family member notified: Morrie Sheldon Patient and family notified of of transfer: 09/08/23  Discharge Plan and Services Additional resources added to the After Visit Summary for       Post Acute Care Choice: Skilled Nursing Facility                               Social Determinants of Health (SDOH) Interventions SDOH Screenings   Food Insecurity: No Food Insecurity (09/03/2023)  Housing: Low Risk  (09/03/2023)  Transportation Needs: No Transportation Needs (09/03/2023)  Utilities: Not At Risk (09/03/2023)  Depression (PHQ2-9): Low Risk  (03/17/2023)  Social Connections: Moderately Integrated (07/20/2022)  Tobacco Use: Low Risk  (09/02/2023)     Readmission Risk Interventions    06/30/2022    1:12 PM 06/17/2022    1:02 PM 01/22/2022   12:19 PM   Readmission Risk Prevention Plan  Transportation Screening Complete Complete Complete  Medication Review Oceanographer) Complete Complete Complete  PCP or Specialist appointment within 3-5 days of discharge Complete Complete Complete  HRI or Home Care Consult Complete Complete Complete  SW Recovery Care/Counseling Consult Complete Complete Complete  Palliative Care Screening Not Applicable Not Applicable Not Applicable  Skilled Nursing Facility Not Applicable Complete Not Applicable

## 2023-09-08 NOTE — Progress Notes (Signed)
   09/08/23 0517  Vitals  Temp 98.5 F (36.9 C)  Temp Source Oral  BP (!) 148/78  MAP (mmHg) 95  BP Location Right Arm  BP Method Automatic  Patient Position (if appropriate) Lying  Pulse Rate 60  Pulse Rate Source Monitor  ECG Heart Rate (!) 59  Resp 20  Oxygen Therapy  SpO2 100 %  During Treatment Monitoring  Blood Flow Rate (mL/min) 400 mL/min  HD Safety Checks Performed Yes  Intra-Hemodialysis Comments Tx completed  Post Treatment  Dialyzer Clearance Lightly streaked  Liters Processed 54  Fluid Removed (mL) 1000 mL  Tolerated HD Treatment Yes  Post-Hemodialysis Comments Pt request to D/C treatment early.  AVG/AVF Arterial Site Held (minutes) 10 minutes  AVG/AVF Venous Site Held (minutes) 10 minutes  Fistula / Graft Left Forearm Arteriovenous fistula  Placement Date/Time: (c) 12/23/21 1646   Placed prior to admission: No  Orientation: Left  Access Location: Forearm  Access Type: Arteriovenous fistula  Site Condition No complications  Fistula / Graft Assessment Thrill  Status Deaccessed  Needle Size 15  Drainage Description None   Pt request to dialyze  for  two hours 15 minutes. Pt sign AMA.Nephrologist informed.Hand off to C. Marilynne Drivers.

## 2023-09-08 NOTE — Discharge Planning (Signed)
[facility] Patient Discharge Orders- [facility] CLINIC: [facility]  Patient's name: Diamond [name] Admit/DC Dates: 09/02/2023 - 09/08/23  Discharge Diagnoses: Acute recurrent pancreatitis - symptoms resolved   Acute on chronic anemia Lung nodule - noted on CT, needs follow up CT vs PET scan   Aranesp: Given: yes   Date and amount of last dose: on 09/05/23  Last Hgb: 8.3  PRBC's Given: yes Date/# of units: 1 unit on 09/06/23 ESA dose for discharge: mircera 200 mcg IV q 2 weeks - next 10/7 IV Iron dose at discharge: no  Heparin change: no  EDW Change: no New EDW:   Bath Change: no  Access intervention/Change: no Details:  Hectorol/Calcitriol change: no  Discharge Labs: Calcium 9.1 Phosphorus 4.3 Albumin 2.3 K+  3.5  START ONSP if not already  IV Antibiotics: no Details:  On Coumadin?: no Last INR: Next INR: Managed By:   OTHER/APPTS/LAB ORDERS:    D/C Meds to be reconciled by nurse after every discharge.  Completed By: Virgina Norfolk, PA-C   Reviewed by: MD:______ RN_______

## 2023-09-08 NOTE — TOC Progression Note (Signed)
Transition of Care [facility]) - Progression Note    Patient Details  Name: Diamond [name] MRN: [id number] Date of Birth: 01-Sep-1951  Transition of Care [facility]) CM/SW Contact  Delilah Shan, LCSWA Phone Number: 09/08/2023, 1:32 PM  Clinical Narrative:     Per MD patient medically ready for DC. CSW spoke with Nicki with [facility] who confirmed patient can return today.  CSW spoke with Marylu Lund with [facility]. Marylu Lund confirmed approval for [facility] transport for patient. CSW will continue to follow.  Expected Discharge Plan: Skilled Nursing Facility Barriers to Discharge: Continued Medical Work up  Expected Discharge Plan and Services     Post Acute Care Choice: Skilled Nursing Facility Living arrangements for the past 2 months: Skilled Nursing Facility                                       Social Determinants of Health (SDOH) Interventions SDOH Screenings   Food Insecurity: No Food Insecurity (09/03/2023)  Housing: Low Risk  (09/03/2023)  Transportation Needs: No Transportation Needs (09/03/2023)  Utilities: Not At Risk (09/03/2023)  Depression (PHQ2-9): Low Risk  (03/17/2023)  Social Connections: Moderately Integrated (07/20/2022)  Tobacco Use: Low Risk  (09/02/2023)    Readmission Risk Interventions    06/30/2022    1:12 PM 06/17/2022    1:02 PM 01/22/2022   12:19 PM  Readmission Risk Prevention Plan  Transportation Screening Complete Complete Complete  Medication Review Ocean[facility]) Complete Complete Complete  PCP or Specialist appointment within 3-5 days of discharge Complete Complete Complete  HRI or Home Care Consult Complete Complete Complete  SW Recovery Care/Counseling Consult Complete Complete Complete  Palliative Care Screening Not Applicable Not Applicable Not Applicable  Skilled Nursing Facility Not Applicable Complete Not Applicable

## 2023-09-08 NOTE — Progress Notes (Signed)
[facility] Progress Note   Subjective:   Patient seen and examined at bedside.  HD completed early this AM.  Tolerated well.  Denies CP, SOB, abdominal pain and n/v/d.  Just finished breakfast.  Denies visible blood loss.   Objective Vitals:   09/08/23 0430 09/08/23 0502 09/08/23 0517 09/08/23 1113  BP: (!) 150/84 (!) 149/96 (!) 148/78 (!) 115/59  Pulse: 62 62 60 65  Resp: 20 20 20 18   Temp:   98.5 F (36.9 C) 99.2 F (37.3 C)  TempSrc:   Oral Oral  SpO2: 100% 100% 100% 98%  Weight:      Height:       Physical Exam General: alert, pleasant female in NAD Heart:RRR, no mrg Lungs:CTAB, nml WOB on RA Abdomen:soft, NTND Extremities:no LE edema Dialysis Access: LU AVF +b/t   Filed Weights   09/02/23 1211 09/05/23 0849 09/05/23 1324  Weight: 59 kg 51.6 kg 50.4 kg    Intake/Output Summary (Last 24 hours) at 09/08/2023 1223 Last data filed at 09/08/2023 0517 Gross per 24 hour  Intake 436 ml  Output 1350 ml  Net -914 ml    Additional Objective Labs: Basic Metabolic Panel: Recent Labs  Lab 09/03/23 0356 09/05/23 0458 09/07/23 1907  NA 133* 137 134*  K 4.9 3.7 3.5  CL 95* 94* 97*  CO2 25 28 26   GLUCOSE 108* 98 108*  BUN 69* 40* 55*  CREATININE 9.42* 5.77* 6.53*  CALCIUM 9.2 8.9 9.1  PHOS  --  5.6* 4.3   Liver Function Tests: Recent Labs  Lab 09/02/23 1256 09/05/23 0458 09/05/23 1832 09/07/23 1907  AST 24  --   --   --   ALT 19  --   --   --   ALKPHOS 85  --   --   --   BILITOT 0.8  --  0.3  --   PROT 6.5  --   --   --   ALBUMIN 2.8* 2.3*  --  2.3*   Recent Labs  Lab 09/02/23 1256  LIPASE 534*   CBC: Recent Labs  Lab 09/02/23 1256 09/03/23 0356 09/05/23 1832 09/06/23 0503 09/06/23 1156 09/07/23 0431 09/07/23 1907 09/08/23 1002  WBC 5.0   < > 3.8* 3.4*  --  4.0 4.3 3.8*  NEUTROABS 2.7  --  2.3  --   --   --   --   --   HGB 8.2*   < > 7.0* 6.2*   < > 8.3* 8.0* 8.3*  HCT 24.1*   < > 20.2* 18.3*   < > 23.8* 23.3* 24.0*  MCV  97.6   < > 98.5 95.8  --  95.2 94.3 95.6  PLT 172   < > 127* 113*  --  112* 112* 120*   < > = values in this interval not displayed.   Blood Culture    Component Value Date/Time   SDES BLOOD RIGHT HAND 08/17/2023 1104   SPECREQUEST  08/17/2023 1104    BOTTLES DRAWN AEROBIC AND ANAEROBIC Blood Culture adequate volume   CULT  08/17/2023 1104    NO GROWTH 5 DAYS Performed at [facility], [address] [place], [place] [address]    REPTSTATUS 08/22/2023 FINAL 08/17/2023 1104    CBG: Recent Labs  Lab 09/03/23 2140  GLUCAP 79   Iron Studies:  Recent Labs    09/05/23 1832  FERRITIN 1,042*    Medications:   sodium chloride   Intravenous Once  sodium chloride   Intravenous Once   amiodarone  200 mg Oral Daily   darbepoetin (ARANESP) injection - NON-DIALYSIS  200 mcg Subcutaneous Q Mon-1800   feeding supplement  1 Container Oral TID BM   fluticasone furoate-vilanterol  1 puff Inhalation Daily   gabapentin  300 mg Oral Q M,W,F-2000   hydrALAZINE  50 mg Oral 2 times per day on Monday Wednesday Friday   levothyroxine  25 mcg Oral QAC breakfast   lidocaine  1 patch Transdermal Q24H   metoprolol tartrate  50 mg Oral BID   pantoprazole  40 mg Oral Daily   [START ON 09/09/2023] pneumococcal 20-valent conjugate vaccine  0.5 mL Intramuscular Tomorrow-1000   QUEtiapine  25 mg Oral Once   sevelamer carbonate  800 mg Oral TID WC    Dialysis Orders: [facility] on MWF 3.5 hours EDW 51 kg 2K, 2 Ca bath, left upper arm AV fistula, Mircera 200 every 2 weeks not given since 08/15/23  Venofer 50 mg weekly no heparin no vitamin D   Assessment/Plan: 1.  ESRD -HD completed overnight.  Next HD 10/4 per regular schedule.   2.  Mild hyperkalemia - K 5.8 on admission improved to 3.5. 3. Acute on chronic Anemia  -Hgb drop 6.2, improved and stable in 8s s/p 1 unit pRBC.  Denies visible blood loss.  4. Pancytopenia -  Hgb, plt improved. WBC fluctuating.  5. Acute recurrent pancreatitis-  per admit team, symptoms resolved. Pt denies any symptoms.  6. Hypertension/volume  -BP variable, continue home meds. Does not appear overloaded. UF as tolerated.  7. Metabolic bone disease -calcium at goal, phos mildly elevated, no VDRA. 8. Lung nodule seen on CT angiogram follow-up as outpatient per admit team 9. Nutrition -renal failure diet as tolerated 10. Dispo - ok to d/c from renal standpoint  Virgina Norfolk, PA-C [facility] 09/08/2023,12:23 PM  LOS: 5 days

## 2023-09-08 NOTE — Progress Notes (Addendum)
Advised by CSW that pt will return to snf today. Contacted [facility] to make aware of pt's d/c today and that pt should resume care tomorrow.   Olivia Canter Renal Navigator [phone]

## 2023-09-08 NOTE — Discharge Summary (Cosign Needed Addendum)
Family Medicine Teaching [facility] Discharge Summary  Patient name: Diamond [name] Medical record number: [id number] Date of birth: 09-20-1951 Age: 72 y.o. Gender: female Date of Admission: 09/02/2023  Date of Discharge: 09/08/23 Admitting Physician: Margaretmary Dys, MD  Primary Care Provider: [facility] Consultants: Nephrology  Indication for Hospitalization: Abdominal pain radiating to back  Brief Hospital Course:  Diamond [name] is a 72 y.o.female with a history of ESRD on HD, PAF, HTN, Asthma, Gout, Hep C, and Chronic Osteo L2/L3 who was admitted to the [facility] Medicine Teaching Service at [facility] for acute pancreatitis. Her hospital course is detailed below:  Pancreatitis Patient presenting for acute abdominal pain with radiation to back. At presentation, lipase 534 though CT Abd/Pelv without pancreatic inflammatory changes. Underlying etiology of this pancreatitis is unclear. No alcohol use, s/p cholecystectomy, no hepatic injury. Patient received some fluids and tylenol, with symptomatic improvement on same day of admission. By time of discharge, patient was tolerating PO well without abdominal pain or N/V.   ESRD on HD Pt has history of ESRD on HD (MWF schedule) but was too ill to make her HD appointment  on 9/27. Nephrology was consulted and involved in patient's care. Patient received HD on 9/30, 10/2, and 10/3 without issue.   Hyperkalemia K 5.8 on admission, likely 2/2 missed HD session on 9/27. Patient received lokelma and insulin/dextrose, with improvement to normal serum potassium.   Anemia Patient initially noted to have Hgb 8.2 with baseline around 8-9. On day 3 of hospital stay, Hgb decreased 6.7 > 6.6 without obvious signs of bleeding. Hemolysis labs were negative. Pt received 1U PRBCs and Hgb improved to 8.1. There is no clear origin of bleeding to cause this anemia; possibly occult GI bleed.  Lung Nodule Upon  admission, CT angiogram showed a right lower lobe focal consolidation that was recommended for follow-up after her acute inflammatory process was resolved.   Other chronic conditions were medically managed with home medications and formulary alternatives as necessary:  Hypertension: Metoprolol tartate 50 mg, Hydralizine 50 mg (for SBP>170) Hypothyroidism: levothyroxine 25 PAF: Eliquis 2.5 mg BID, metoprolol 50 mg, Amiodarone 200 mg Asthma: albuterol, breo ellipta Chronic pain: gabapentin 300 mg on dialysis days, lidocaine patches, menthol analgesic gel  PCP Follow-up Recommendations: Follow up on CT Angiogram possible lung mass, consider dedicated CT or PET Evaluate whether any of the patient's medications can be eliminated that might contribute to pancreatitis. Please review meds, especially antibiotics. Minimize risk of pancreatitis. 4. F/u with nephrologist  5. F/u on possibly occult GI bleed leading to anemia 6. Consider Eliquis use in the setting of both Afib and possible GI bleed. 7. Consider discontinuing Norco given age; she did not require this in the hospital 8. Holding Eliquis after hospitalization due to suspected occult GI bleed  Discharge Diagnoses/Problem List:  Principal Problem:   Pancreatitis Active Problems:   ESRD on hemodialysis (HCC)   Hypertension   Paroxysmal atrial flutter (HCC)   PAF (paroxysmal atrial fibrillation) (HCC)   Hyperkalemia   Lung nodule seen on imaging study   Elevated white blood cell count, unspecified   Anemia, unspecified  Disposition: Return to SNF  Discharge Condition: Stable  Discharge Exam:  General: Well-appearing, no acute distress. Cardio: Regular rate, regular rhythm, no murmurs on exam. Pulm: Clear, no wheezing, no crackles. No increased work of breathing. Abdominal: Normoactive bowel sounds present, soft, non-tender, non-distended. Extremities: no peripheral edema. Moves all extremities equally. Neuro: Alert  and  oriented, speech normal in content. Psych:  Cognition and judgment appear intact. Alert, communicative, and cooperative.   Issues for Follow Up:  Follow up on CT Angiogram possible lung mass, consider dedicated CT or PET Evaluate whether any of the patient's medications can be eliminated that might contribute to pancreatitis. Please review meds, especially antibiotics. Minimize risk of pancreatitis. 4.    F/u with nephrologist  5.    F/u on possibly occult GI bleed leading to anemia 6.    Consider Eliquis use in the setting of both Afib and possible GI bleed. 7.    Consider discontinuing Norco given age; she did not require this in the hospital 8.    Holding Eliquis after hospitalization due to suspected occult GI bleed  Significant Procedures:  Blood transfusion 10/1 Hemodialysis 9/30, 10/2, 10/3  Significant Labs and Imaging:  Recent Labs  Lab 09/07/23 0431 09/07/23 1907 09/08/23 1002  WBC 4.0 4.3 3.8*  HGB 8.3* 8.0* 8.3*  HCT 23.8* 23.3* 24.0*  PLT 112* 112* 120*   Recent Labs  Lab 09/07/23 1907  NA 134*  K 3.5  CL 97*  CO2 26  GLUCOSE 108*  BUN 55*  CREATININE 6.53*  CALCIUM 9.1  PHOS 4.3  ALBUMIN 2.3*    Results/Tests Pending at Time of Discharge: none  Discharge Medications:  Allergies as of 09/08/2023       Reactions   Shellfish-derived Products Other (See Comments)   Shrimp (diagnostic) Other (See Comments)   Unknown reaction   Other Swelling, Rash   Anesthesia - facial swelling, rash        Medication List     STOP taking these medications    apixaban 2.5 MG Tabs tablet Commonly known as: ELIQUIS       TAKE these medications    acetaminophen 500 MG tablet Commonly known as: TYLENOL Take 1,000 mg by mouth every 8 (eight) hours as needed (for back and leg pain). Take 2 tablets (1000mg  total) by mouth every 8 hours as needed for back and leg pain.   albuterol 108 (90 Base) MCG/ACT inhaler Commonly known as: VENTOLIN HFA Inhale 2 puffs  into the lungs every 4 (four) hours as needed for wheezing or shortness of breath.   amiodarone 200 MG tablet Commonly known as: PACERONE Take 1 tablet (200 mg total) by mouth daily.   Biofreeze 4 % Gel Generic drug: Menthol (Topical Analgesic) Apply 1 Application topically 3 (three) times daily as needed (lower back and knee pain).   cloNIDine 0.1 MG tablet Commonly known as: CATAPRES Take 0.1 mg by mouth every 6 (six) hours as needed (SBP > 170).   Darbepoetin Alfa 200 MCG/0.4ML Sosy injection Commonly known as: ARANESP Inject 0.4 mLs (200 mcg total) into the skin every Monday at 6 PM. Start taking on: September 12, 2023   Desitin Multi-Purpose Healing Oint Apply 1 Application topically 3 (three) times daily as needed (with incontinence to prevent skin breakdown).   fluticasone-salmeterol 250-50 MCG/ACT Aepb Commonly known as: Advair Diskus Inhale 1 puff into the lungs in the morning and at bedtime.   gabapentin 300 MG capsule Commonly known as: NEURONTIN Take 300 mg by mouth 3 (three) times a week. Take on Mondays, Wednesdays and Fridays after dialysis.   hydrALAZINE 50 MG tablet Commonly known as: APRESOLINE Take 50 mg by mouth See admin instructions. Take 1 tablet by mouth two times daily on Mon-Wed-Fri, and 1 tablet by mouth three times daily on Tues-Thurs-Sat.   HYDROcodone-acetaminophen  7.5-325 MG tablet Commonly known as: NORCO Take 1 tablet by mouth 2 (two) times daily.   levothyroxine 25 MCG tablet Commonly known as: SYNTHROID Take 25 mcg by mouth daily before breakfast.   lidocaine 4 % Place 1 patch onto the skin daily. Apply for 12 hours to lower back and leave off for 12 hours between patches.   liver oil-zinc oxide 40 % ointment Commonly known as: DESITIN Apply 1 Application topically as needed (to buttocks area to prevent MASD).   metoprolol tartrate 50 MG tablet Commonly known as: LOPRESSOR Take 1 tablet (50 mg total) by mouth 2 (two) times daily.    Minerin Lotn Apply 1 Application topically 3 (three) times daily as needed (dry skin).   Nutritional Supplement Liqd Take 120 mLs by mouth 3 (three) times daily. Sugar free - record amount consumed   ondansetron 4 MG tablet Commonly known as: ZOFRAN Take 4 mg by mouth every 6 (six) hours as needed for nausea or vomiting.   pantoprazole 40 MG tablet Commonly known as: PROTONIX Take 1 tablet (40 mg total) by mouth daily.   sevelamer 800 MG tablet Commonly known as: RENAGEL Take 800 mg by mouth See admin instructions. Take 2 tablets (1600mg  total) by mouth 3 times a day every Tuesday, Thursday and Saturday. Take 2 tablets (1600mg  total) with meals every Monday, Wednesday, Friday and Sunday.        Discharge Instructions: Please refer to Patient Instructions section of EMR for full details.  Patient was counseled important signs and symptoms that should prompt return to medical care, changes in medications, dietary instructions, activity restrictions, and follow up appointments.   Follow-Up Appointments:  Contact information for follow-up providers     [facility]. Schedule an appointment as soon as possible for a visit in 3 day(s).   Why: Make an appointment for hospital follow-up Contact information: [address] [place] [address] [phone]         Raymondo Band, MD. Schedule an appointment as soon as possible for a visit.   Specialty: Infectious Diseases Why: You are due for follow-up with Dr. Yvonna Alanis information: [address] [place] [address] [phone]              Contact information for after-discharge care     Destination     [facility] Preferred SNF .   Service: Skilled Nursing Contact information: [address] [place] [address] [phone]                     Cyndia Skeeters, DO 09/08/2023, 2:19 PM PGY-1, [facility]    Upper Level Addendum:  I have seen and evaluated this patient along with Dr. Mliss Sax and reviewed the above note.  Darral Dash, D.O. 09/09/2023, 7:46 AM PGY-3, [facility]

## 2023-09-13 ENCOUNTER — Ambulatory Visit (INDEPENDENT_AMBULATORY_CARE_PROVIDER_SITE_OTHER): Payer: Medicare (Managed Care) | Admitting: Internal Medicine

## 2023-09-13 ENCOUNTER — Other Ambulatory Visit: Payer: Self-pay

## 2023-09-13 ENCOUNTER — Encounter: Payer: Self-pay | Admitting: Internal Medicine

## 2023-09-13 VITALS — BP 100/66 | HR 69 | Temp 97.9°F

## 2023-09-13 DIAGNOSIS — M4620 Osteomyelitis of vertebra, site unspecified: Secondary | ICD-10-CM | POA: Diagnosis not present

## 2023-09-13 NOTE — Patient Instructions (Signed)
We can continue to stay off bactrim for now. Looks like it was accidentally removed from your list since our last visit   If you relapse with a back infection, will make sure you stay on that for ever    Labs today   See me once more in 3 months. If you do well then will discharge from clinic

## 2023-09-13 NOTE — Progress Notes (Signed)
[facility]  Patient Active Problem List   Diagnosis Date Noted   Pancreatitis 09/03/2023   Disorder of phosphorus metabolism, unspecified 04/08/2023   Acute on chronic osteomyelitis (HCC) 01/16/2023   Metabolic acidosis 01/16/2023   Chronic osteomyelitis of lumbar spine (HCC) 12/08/2022   Acute cough 12/07/2022   Fever in adult 12/02/2022   Altered mental status 11/30/2022   AMS (altered mental status) 11/30/2022   PAF (paroxysmal atrial fibrillation) (HCC)    Thyroid nodule 09/17/2022   Empty sella (HCC) 09/17/2022   Discitis of lumbosacral region 07/26/2022   Hypertensive urgency 07/21/2022   Bacteremia 06/30/2022   Malnutrition of moderate degree 06/22/2022   Physical deconditioning 06/18/2022   Pressure injury of skin 06/16/2022   MRSA bacteremia 06/13/2022   ESRD on dialysis [facility])    Paroxysmal atrial flutter (HCC)    Acute metabolic encephalopathy    Anemia, unspecified 02/05/2022   Multiple polyps of sigmoid colon    Heme positive stool    Duodenitis    Candida esophagitis (HCC)    Gallbladder mass    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  01/16/2022   Discitis of lumbar region 01/16/2022   Hepatitis C without hepatic coma 01/15/2022   Sepsis due to methicillin resistant Staphylococcus aureus (HCC) 12/31/2021   Atrial flutter (HCC) 12/25/2021   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 seen on TEE on 1/17 12/24/2021   Lung nodule seen on imaging study 12/23/2021   Vertebral osteomyelitis (HCC)    Elevated white blood cell count, unspecified 12/17/2021   Sepsis due to methicillin susceptible Staphylococcus aureus (HCC) 12/17/2021   Lower back pain 12/17/2021   ESRD on hemodialysis (HCC) 12/17/2021   Encounter for screening for other viral diseases 11/03/2021   Allergy, unspecified, initial encounter 09/14/2021   Coagulation defect, unspecified (HCC) 09/14/2021   Complication of vascular  dialysis catheter 09/14/2021   Gout due to renal impairment, right ankle and foot 09/14/2021   Iron deficiency anemia, unspecified 09/14/2021   Pain, unspecified 09/14/2021   Pruritus, unspecified 09/14/2021   Secondary hyperparathyroidism of renal origin (HCC) 09/14/2021   Shortness of breath 09/14/2021   Unspecified protein-calorie malnutrition (HCC) 09/14/2021   Vitamin D deficiency 01/02/2021   Hyperparathyroidism (HCC) 12/31/2020   CKD (chronic kidney disease), stage III (HCC) 11/10/2019   Cardiomegaly 11/05/2019   Hyperkalemia 11/05/2019   Hypokalemia 10/18/2019   Pneumonia due to COVID-19 virus 10/17/2019   Hypertension    Asthma    Gout    Hyponatremia    Multifactorial anemia-acute blood loss from bleeding HD catheter site-superimposed on anemia related to ESRD.       Subjective:    Patient ID: Diamond [name], female    DOB: 05-03-1951, 72 y.o.   MRN: [id number]  Chief Complaint  Patient presents with   Follow-up     HPI:  Diamond [name] is a 72 y.o. female esrd on iHD, recurrent mrsa bacteremia, recent finished 4 weeks iv cefazolin 07/13/22 for mrsa bacteremia then readmitted 2 weeks later 8/22 for acute on chronic back pain with mri concerning for l2-3 paravertebral edema and still elevated crp, s/p 8 weeks vancomycin by 12/11 then with recurrent mrsa bacteremia, here for f/u   09/13/23 id clinic f/u Patient came from her nursing home ([facility]) She was recently admitted 9/28-10/3/24 for pancreatitis I reviewed mar and it doesn't appear she is on any bactrim at this time I reviewed H&P and discharge from  above admission and I do not see bactrim mentioned although osteomyelitis was mentioned She didn't bring any lab today but these are done during dialysis session No new back pain   No fever, chill Eating well Feeling well "Trouble with gas a lot"   03/17/23 id clinic f/u Doing well in wheel chair non-ambulatory which is chronic even before  infection Minimal back pain No f/c Coming to end of iv abx vanc Plan to transition to oral suppressive bactrim   02/17/23 id clinic f/u Patient was admitted 01/16/23 again for mrsa bsi (R tetra, S bactrim) Recurrent episodes 12/2021, 06/2022, 09/2022, and 01/15/2023 Bcx this admission repeat 2/11 negative Tte thickened aortic valve; tee deferred as prolonged abx planned and bcx cleares Mri lumbar spine 01/23/23 showed persistent discistis/om changes at L2-3 and l4-5 with paravertebral phlegmon Left psoas abscess also noted to have progression from 11/2022 -- this was aspirted 2/20 by IR -- note mentioned sample sent for culture although I do not see that on epic Esrd - left AvF no sign of infection/evaluated by vascular surgery She was discharged on 8 weeks of vanc with dialysis until 03/13/23 She is here without labs today I am tempted to place her on a very long oral tail of bactrim   Today her pain is much better than 01/2023 admission She is staying at [facility] She has no complaint In a wheel chair, accompanied by her daughter    ------------ 01/05/23 id clinic visit: Patient got readmitted in the hospital 12/25-1/03 for fever. Negative w/u. Mri lumbar spine showed no new abscess but only evolving changes. Blood cx negative Only had fever x1 episode Crp was elevated but stable  ID team saw during this recent admission as well. She receive bactrim renal dosing oral course and is here for f/u   No n/v/diarrhea/f/c/rash  Back pain not there until she gets up and start moving around. At maximum 7/10. This is most recent since she got out of rehab. During the midst of infection, the pain was very bad and constant   She feels a "band tightness" around lower chest wall last 2 days. She has been walking with a FWW. She is recently out of snf and back home a week prior to this visit  No cough  She thinks she takes bactrim 1 ss tablet daily but not sure. Her daughter prescribes  it.      Prior id clinic visits ----------------- 09/15/22 id clinic f/u She finished bactrim a couple weeks ago she think but not sure Pain same as last visit No worsening appetite No f/c No numbness/tingling No urinary incontinence  10/15/22 id clinic f/u Patient had fever and found to have recurrent mrsa bacteremia and also mri lumbar spine progression of l2-4 vertebral om and ventral epidural phlegmon 10/13 bcx mrsa (R tetra; sensitive to bactrim) 10/16 bcx ngtd She was admitted to [facility] and placed on 8 weeks vancomycin with dialysis until planned 11/15/22 She has moderate back pain still but no fever chill. Same as 09/2022 No malaise/f/c No diarrhea   04/28/23 id clinic f/u She is not sure if she is taking bactrim (as we advised from 03/2023 visit about indefinite therapy) I don't have her nursing home paperwork She has pain in lower back not worse from before No n/v/diarrhea No f/c     Allergies  Allergen Reactions   Shellfish-Derived Products Other (See Comments)   Shrimp (Diagnostic) Other (See Comments)    Unknown reaction   Other  Swelling and Rash    Anesthesia - facial swelling, rash      Outpatient Medications Prior to Visit  Medication Sig Dispense Refill   acetaminophen (TYLENOL) 500 MG tablet Take 1,000 mg by mouth every 8 (eight) hours as needed (for back and leg pain). Take 2 tablets (1000mg  total) by mouth every 8 hours as needed for back and leg pain.     albuterol (VENTOLIN HFA) 108 (90 Base) MCG/ACT inhaler Inhale 2 puffs into the lungs every 4 (four) hours as needed for wheezing or shortness of breath. 6.7 g 0   amiodarone (PACERONE) 200 MG tablet Take 1 tablet (200 mg total) by mouth daily.     cloNIDine (CATAPRES) 0.1 MG tablet Take 0.1 mg by mouth every 6 (six) hours as needed (SBP > 170).     Darbepoetin Alfa (ARANESP) 200 MCG/0.4ML SOSY injection Inject 0.4 mLs (200 mcg total) into the skin every Monday at 6 PM.      fluticasone-salmeterol (ADVAIR DISKUS) 250-50 MCG/ACT AEPB Inhale 1 puff into the lungs in the morning and at bedtime. 14 each 0   gabapentin (NEURONTIN) 300 MG capsule Take 300 mg by mouth 3 (three) times a week. Take on Mondays, Wednesdays and Fridays after dialysis.     hydrALAZINE (APRESOLINE) 50 MG tablet Take 50 mg by mouth See admin instructions. Take 1 tablet by mouth two times daily on Mon-Wed-Fri, and 1 tablet by mouth three times daily on Tues-Thurs-Sat.     HYDROcodone-acetaminophen (NORCO) 7.5-325 MG tablet Take 1 tablet by mouth 2 (two) times daily.     levothyroxine (SYNTHROID) 25 MCG tablet Take 25 mcg by mouth daily before breakfast.     lidocaine 4 % Place 1 patch onto the skin daily. Apply for 12 hours to lower back and leave off for 12 hours between patches.     Menthol, Topical Analgesic, (BIOFREEZE) 4 % GEL Apply 1 Application topically 3 (three) times daily as needed (lower back and knee pain).     metoprolol tartrate (LOPRESSOR) 50 MG tablet Take 1 tablet (50 mg total) by mouth 2 (two) times daily.     Nutritional Supplement LIQD Take 120 mLs by mouth 3 (three) times daily. Sugar free - record amount consumed     ondansetron (ZOFRAN) 4 MG tablet Take 4 mg by mouth every 6 (six) hours as needed for nausea or vomiting.     pantoprazole (PROTONIX) 40 MG tablet Take 1 tablet (40 mg total) by mouth daily. 30 tablet 0   sevelamer (RENAGEL) 800 MG tablet Take 800 mg by mouth See admin instructions. Take 2 tablets (1600mg  total) by mouth 3 times a day every Tuesday, Thursday and Saturday. Take 2 tablets (1600mg  total) with meals every Monday, Wednesday, Friday and Sunday.     Diaper Rash Products (DESITIN MULTI-PURPOSE HEALING) OINT Apply 1 Application topically 3 (three) times daily as needed (with incontinence to prevent skin breakdown).     Emollient (MINERIN) LOTN Apply 1 Application topically 3 (three) times daily as needed (dry skin).     liver oil-zinc oxide (DESITIN) 40 %  ointment Apply 1 Application topically as needed (to buttocks area to prevent MASD).     No facility-administered medications prior to visit.     Social History   Socioeconomic History   Marital status: Widowed    Spouse name: Not on file   Number of children: Not on file   Years of education: Not on file   Highest education level: Not on  file  Occupational History   Not on file  Tobacco Use   Smoking status: Never    Passive exposure: Never   Smokeless tobacco: Never  Vaping Use   Vaping status: Never Used  Substance and Sexual Activity   Alcohol use: No   Drug use: No   Sexual activity: Not Currently    Birth control/protection: Post-menopausal  Other Topics Concern   Not on file  Social History Narrative   Not on file   Social Determinants of Health   Financial Resource Strain: Not on file  Food Insecurity: No Food Insecurity (09/03/2023)   Hunger Vital Sign    Worried About Running Out of Food in the Last Year: Never true    Ran Out of Food in the Last Year: Never true  Transportation Needs: No Transportation Needs (09/03/2023)   PRAPARE - Administrator, Civil Service (Medical): No    Lack of Transportation (Non-Medical): No  Physical Activity: Not on file  Stress: Not on file  Social Connections: Moderately Integrated (07/20/2022)   Social Connection and Isolation Panel [NHANES]    Frequency of Communication with Friends and Family: Three times a week    Frequency of Social Gatherings with Friends and Family: Three times a week    Attends Religious Services: 1 to 4 times per year    Active Member of Clubs or Organizations: Yes    Attends Banker Meetings: 1 to 4 times per year    Marital Status: Widowed  Intimate Partner Violence: Not At Risk (09/03/2023)   Humiliation, Afraid, Rape, and Kick questionnaire    Fear of Current or Ex-Partner: No    Emotionally Abused: No    Physically Abused: No    Sexually Abused: No      Review  of Systems    All other ros negative  Objective:    BP 100/66   Pulse 69   Temp 97.9 F (36.6 C) (Oral)   SpO2 97%  Nursing note and vital signs reviewed.  Physical Exam     General/constitutional: no distress, pleasant; in wheel chair HEENT: Normocephalic, PER, Conj Clear, EOMI, Oropharynx clear Neck supple CV: rrr no mrg Lungs: clear to auscultation, normal respiratory effort Abd: Soft, Nontender Ext: no edema Skin: No Rash Neuro: nonfocal - in wheel chair; bilateral LE 4/5 strength MSK: no peripheral joint swelling/tenderness/warmth; back spines nontender         Labs: Lab Results  Component Value Date   WBC 3.8 (L) 09/08/2023   HGB 8.3 (L) 09/08/2023   HCT 24.0 (L) 09/08/2023   MCV 95.6 09/08/2023   PLT 120 (L) 09/08/2023   Last metabolic panel Lab Results  Component Value Date   GLUCOSE 108 (H) 09/07/2023   NA 134 (L) 09/07/2023   K 3.5 09/07/2023   CL 97 (L) 09/07/2023   CO2 26 09/07/2023   BUN 55 (H) 09/07/2023   CREATININE 6.53 (H) 09/07/2023   GFRNONAA 6 (L) 09/07/2023   CALCIUM 9.1 09/07/2023   PHOS 4.3 09/07/2023   PROT 6.5 09/02/2023   ALBUMIN 2.3 (L) 09/07/2023   LABGLOB 3.6 10/17/2019   AGRATIO 0.6 (L) 10/17/2019   BILITOT 0.3 09/05/2023   ALKPHOS 85 09/02/2023   AST 24 09/02/2023   ALT 19 09/02/2023   ANIONGAP 11 09/07/2023       Component Value Date/Time   CRP 3.8 04/28/2023 0319   CRP 41.1 (H) 03/17/2023 0214   CRP 33.8 (H) 01/15/2023 1218  Micro:  Serology:  Imaging: I personally reviewed and incorporated into decision making  10/18 tte  1. Left ventricular ejection fraction, by estimation, is 60 to 65%. The  left ventricle has normal function. The left ventricle has no regional  wall motion abnormalities. There is mild concentric left ventricular  hypertrophy. Left ventricular diastolic  function could not be evaluated.   2. Right ventricular systolic function is normal. The right ventricular  size is normal.  There is normal pulmonary artery systolic pressure.   3. Left atrial size was moderately dilated.   4. Previously noted mobile mass in the RA not visible on this study.  Right atrial size was mildly dilated.   5. The mitral valve is abnormal. Mild mitral valve regurgitation. No  evidence of mitral stenosis.   6. Tricuspid valve regurgitation is mild to moderate.   7. The aortic valve is tricuspid. There is mild calcification of the  aortic valve. Aortic valve regurgitation is trivial. Aortic valve  sclerosis/calcification is present, without any evidence of aortic  stenosis. Aortic valve area, by VTI measures 2.96  cm. Aortic valve mean gradient measures 3.0 mmHg. Aortic valve Vmax  measures 1.24 m/s.   8. Aortic dilatation noted. There is borderline dilatation of the  ascending aorta, measuring 38 mm.   9. The inferior vena cava is dilated in size with <50% respiratory  variability, suggesting right atrial pressure of 15 mmHg.    01/23/23 mri lumbar spine 1. Transitional L5 vertebra as previously numbered. 2. L2-3 discitis/osteomyelitis with purulence in a chronic L3 body fracture. Progression of left psoas involvement at this level to an abscess appearance measuring up to 2.5 cm. There may be facet arthritis on the right at this level. Posterior epidural phlegmon/collection combines with degeneration to cause high-grade thecal sac narrowing at this level. 3. L4-5 collapse and endplate destruction from more chronic discitis/osteomyelitis. 4. L3-4 and L4-5 advanced degenerative spinal stenosis.  10/13 mri lumbar spine 1. Findings are concerning for progression of discitis/osteomyelitis with increased soft tissue extent of infection in the paraspinal and epidural spaces. Compared to prior exam from 07/26/2022, there is increased abnormal soft tissue in the ventral epidural space at the L2-L3 and L3-L4 disc space levels resulting in moderate to severe spinal canal narrowing at  these levels. There is also increased signal abnormality in the surrounding paraspinal soft tissues and bilateral psoas musculature. 2. Persistent severe spinal canal narrowing at the L5-S1 level secondary to a central disc protrusion. 3. Stable compression deformities of the L2, L3, L4, and L5 vertebral bodies with irregular endplate erosion at the L4-L5 level, best seen on CT of the L-spine obtained on 09/16/2022.  12/26 mri lumbar spine 1. Unchanged findings of discitis-osteomyelitis at L2-L3 and L4-L5 with worsening paravertebral inflammatory changes. No paravertebral or epidural abscess. 2. Unchanged mild-to-moderate L2-L3, moderate to severe L3-L4, and moderate L4-L5 spinal canal stenosis.     Assessment & Plan:   Problem List Items Addressed This Visit       Musculoskeletal and Integument   Vertebral osteomyelitis (HCC) - Primary   Relevant Orders   C-reactive protein   CBC w/Diff   COMPLETE METABOLIC PANEL WITH GFR    72 y.o. female esrd on iHD, recurrent mrsa bacteremia, recent finished 4 weeks iv cefazolin 07/13/22 for mrsa bacteremia then readmitted 2 weeks later 8/22 for acute on chronic back pain with mri concerning for l2-3 paravertebral edema and still elevated crp, s/p 8 weeks vancomycin by 11/15/22 then with recurrent mrsa bacteremia,  here for f/u  09/15/22 Will see what crp is doing Doesn't appear her infection is active at this time Exam showed no tenderness and the pain suspected to be neuropathic  Will repeat lab again and evaluation in 4 weeks Advise complex pain clinic management  If crp continues to go up will consider mri  Will start gabapentin  Advise she talks to pcp about  pain clinic referral   -------- 10/15/22 assessment Recurrent bacteremia mrsa with lumbar spine om; no frank epidural abscess Back pain stable Getting vanc with iHD until 11/15/2022 Will reassess then to see if she still needs oral abx tail  Trial of lidocaine patch  -- rx Increase gabapentin gradually to 300 mg qhs -- renew rx  See me around 2nd week of December  ------------ 01/05/23 id clinic f/u Recent fever 12/25 negative sepsis w/u. Crp stable high at low teens. Mri lumbar spine 12/26 no new abscess or discitis Finished 8 weeks 11/15/22 but given bactrim during that admission She is 4-5 months into abx course now  Will stop today as clinically improved   Will see her again in 4-5 weeks off antibiotics and check crp and clinicaly exam to ascertain remission. See me sooner if sign of infection    02/17/23 id clinic assessment 4th episodes mrsa bsi with relapse psoas abscess and lumbar om Back on vanc since 01/15/23 (about 2weeks after finish several months abx for same with improving mri and sx)  Will continue 8 weeks vanc until 4/14 then transition to at least  months bactrim  F/u around 4/14 to get on oral abx  03/17/23 id assessment Will transition to bactrim ds 1 tab po bid today  Plan at least 3 months and if tolerate I might push indefinitely given several relapse in the past year  F/u 6 weeks repeat crp and see how she does    04/28/23 id assessment Clinically stable no sign of sepsis or worsening back pain Called nursing home and verified that she takes 1 tablet bid  Will continue bactrim ds 1 t  Crp today   09/13/23 id clinic assessment Crp normal last visit Appear the nursing home might have stopped her bactrim suppression/prophy -- mar doesn't show anymore. Patient not awared if she is taken off or taking it Called [facility] and they are not clear as well when it was stopped (they confirm she wasn't on bactrim) 9/28-10/3/24 [place] admission also didn't mention any bactrim so might have been accidentally stopped before that Explained to her if she has been off and doing well we can keep off an monitor F/u 3 months or sooner if persistent new back pain/fever/chill. If she does well then, will discharge from  clinic    Follow-up: Return in about 3 months (around 12/14/2023).      Raymondo Band, MD [facility] 09/13/2023, 11:26 AM

## 2023-09-14 LAB — CBC WITH DIFFERENTIAL/PLATELET
Absolute Monocytes: 435 {cells}/uL (ref 200–950)
Basophils Absolute: 30 {cells}/uL (ref 0–200)
Basophils Relative: 1 %
Eosinophils Absolute: 150 {cells}/uL (ref 15–500)
Eosinophils Relative: 5 %
HCT: 27.9 % — ABNORMAL LOW (ref 35.0–45.0)
Hemoglobin: 8.8 g/dL — ABNORMAL LOW (ref 11.7–15.5)
Lymphs Abs: 981 {cells}/uL (ref 850–3900)
MCH: 32.4 pg (ref 27.0–33.0)
MCHC: 31.5 g/dL — ABNORMAL LOW (ref 32.0–36.0)
MCV: 102.6 fL — ABNORMAL HIGH (ref 80.0–100.0)
MPV: 12 fL (ref 7.5–12.5)
Monocytes Relative: 14.5 %
Neutro Abs: 1404 {cells}/uL — ABNORMAL LOW (ref 1500–7800)
Neutrophils Relative %: 46.8 %
Platelets: 149 10*3/uL (ref 140–400)
RBC: 2.72 10*6/uL — ABNORMAL LOW (ref 3.80–5.10)
RDW: 15.7 % — ABNORMAL HIGH (ref 11.0–15.0)
Total Lymphocyte: 32.7 %
WBC: 3 10*3/uL — ABNORMAL LOW (ref 3.8–10.8)

## 2023-09-14 LAB — COMPLETE METABOLIC PANEL WITH GFR
AG Ratio: 1.1 (calc) (ref 1.0–2.5)
ALT: 9 U/L (ref 6–29)
AST: 13 U/L (ref 10–35)
Albumin: 3.3 g/dL — ABNORMAL LOW (ref 3.6–5.1)
Alkaline phosphatase (APISO): 75 U/L (ref 37–153)
BUN/Creatinine Ratio: 4 (calc) — ABNORMAL LOW (ref 6–22)
BUN: 25 mg/dL (ref 7–25)
CO2: 31 mmol/L (ref 20–32)
Calcium: 9.6 mg/dL (ref 8.6–10.4)
Chloride: 97 mmol/L — ABNORMAL LOW (ref 98–110)
Creat: 6.14 mg/dL — ABNORMAL HIGH (ref 0.60–1.00)
Globulin: 2.9 g/dL (ref 1.9–3.7)
Glucose, Bld: 109 mg/dL — ABNORMAL HIGH (ref 65–99)
Potassium: 4.2 mmol/L (ref 3.5–5.3)
Sodium: 139 mmol/L (ref 135–146)
Total Bilirubin: 0.5 mg/dL (ref 0.2–1.2)
Total Protein: 6.2 g/dL (ref 6.1–8.1)
eGFR: 7 mL/min/{1.73_m2} — ABNORMAL LOW (ref >=60)

## 2023-09-14 LAB — C-REACTIVE PROTEIN: CRP: 12 mg/L — ABNORMAL HIGH (ref <8.0)

## 2023-09-16 ENCOUNTER — Telehealth: Payer: Self-pay

## 2023-09-16 NOTE — Telephone Encounter (Signed)
Called [facility], left voicemail for scheduler Daphane Shepherd requesting call back to get patient scheduled and get fax number to nurse's station to send copy of labs.   Sandie Ano, RN

## 2023-09-16 NOTE — Telephone Encounter (Signed)
-----   Message from Raymondo Band sent at 09/16/2023  9:29 AM EDT ----- The nursing home took her off abx. I didn't restart it, please let her know I would like to see her again in around 4-6 weeks  Crp a little high of unclear significance at this time   thanks

## 2023-09-20 NOTE — Telephone Encounter (Signed)
Called [facility] and got fax number to nurse's station. Labs faxed and message also sent requesting call back to schedule patient for follow up.   F: [phone]  Left a second voicemail for SNF scheduler requesting call back.   Sandie Ano, RN

## 2023-09-21 NOTE — Telephone Encounter (Addendum)
Second attempt made to schedule follow up appointment. Left voicemail with Daphane Shepherd requesting call back today   [phone] Juanita Laster, RMA

## 2023-09-22 NOTE — Telephone Encounter (Signed)
Patient scheduled for appointment with Dr. Renold Don on 11/7. Patient uses [facility] for appointments.  Phone: [phone]

## 2023-10-13 ENCOUNTER — Ambulatory Visit: Payer: Medicare (Managed Care) | Admitting: Internal Medicine

## 2023-10-28 ENCOUNTER — Encounter (HOSPITAL_COMMUNITY): Payer: Self-pay

## 2023-10-28 ENCOUNTER — Other Ambulatory Visit (HOSPITAL_COMMUNITY): Payer: Self-pay | Admitting: Vascular Surgery

## 2023-10-28 ENCOUNTER — Other Ambulatory Visit (HOSPITAL_COMMUNITY): Payer: Self-pay | Admitting: Internal Medicine

## 2023-10-28 DIAGNOSIS — R911 Solitary pulmonary nodule: Secondary | ICD-10-CM

## 2023-10-28 DIAGNOSIS — E042 Nontoxic multinodular goiter: Secondary | ICD-10-CM

## 2023-10-28 DIAGNOSIS — I480 Paroxysmal atrial fibrillation: Secondary | ICD-10-CM

## 2023-11-17 ENCOUNTER — Ambulatory Visit (HOSPITAL_COMMUNITY): Payer: Medicare (Managed Care)

## 2023-11-28 ENCOUNTER — Ambulatory Visit (HOSPITAL_COMMUNITY)
Admission: RE | Admit: 2023-11-28 | Discharge: 2023-11-28 | Disposition: A | Discharge status: Home / Self Care | Payer: Medicare (Managed Care) | Source: Ambulatory Visit | Attending: Vascular Surgery | Admitting: Vascular Surgery

## 2023-11-28 DIAGNOSIS — E042 Nontoxic multinodular goiter: Secondary | ICD-10-CM | POA: Insufficient documentation

## 2023-11-28 DIAGNOSIS — R911 Solitary pulmonary nodule: Secondary | ICD-10-CM | POA: Diagnosis present

## 2023-12-09 ENCOUNTER — Ambulatory Visit (HOSPITAL_COMMUNITY): Payer: Medicare (Managed Care)

## 2023-12-14 ENCOUNTER — Ambulatory Visit: Payer: Medicare (Managed Care) | Admitting: Internal Medicine

## 2023-12-20 ENCOUNTER — Encounter: Payer: Self-pay | Admitting: Internal Medicine

## 2023-12-20 ENCOUNTER — Other Ambulatory Visit: Payer: Self-pay

## 2023-12-20 ENCOUNTER — Ambulatory Visit (INDEPENDENT_AMBULATORY_CARE_PROVIDER_SITE_OTHER): Payer: Medicare (Managed Care) | Admitting: Internal Medicine

## 2023-12-20 VITALS — BP 132/81 | HR 61 | Temp 97.6°F | Resp 16

## 2023-12-20 DIAGNOSIS — M4620 Osteomyelitis of vertebra, site unspecified: Secondary | ICD-10-CM | POA: Diagnosis not present

## 2023-12-20 NOTE — Progress Notes (Signed)
 [facility]  Patient Active Problem List   Diagnosis Date Noted   Pancreatitis 09/03/2023   Disorder of phosphorus metabolism, unspecified 04/08/2023   Acute on chronic osteomyelitis (HCC) 01/16/2023   Metabolic acidosis 01/16/2023   Chronic osteomyelitis of lumbar spine (HCC) 12/08/2022   Acute cough 12/07/2022   Fever in adult 12/02/2022   Altered mental status 11/30/2022   AMS (altered mental status) 11/30/2022   PAF (paroxysmal atrial fibrillation) (HCC)    Thyroid  nodule 09/17/2022   Empty sella (HCC) 09/17/2022   Discitis of lumbosacral region 07/26/2022   Hypertensive urgency 07/21/2022   Bacteremia 06/30/2022   Malnutrition of moderate degree 06/22/2022   Physical deconditioning 06/18/2022   Pressure injury of skin 06/16/2022   MRSA bacteremia 06/13/2022   ESRD on dialysis [facility])    Paroxysmal atrial flutter (HCC)    Acute metabolic encephalopathy    Anemia, unspecified 02/05/2022   Multiple polyps of sigmoid colon    Heme positive stool    Duodenitis    Candida esophagitis (HCC)    Gallbladder mass    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  01/16/2022   Discitis of lumbar region 01/16/2022   Hepatitis C without hepatic coma 01/15/2022   Sepsis due to methicillin resistant Staphylococcus aureus (HCC) 12/31/2021   Atrial flutter (HCC) 12/25/2021   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 seen on TEE on 1/17 12/24/2021   Lung nodule seen on imaging study 12/23/2021   Vertebral osteomyelitis (HCC)    Elevated white blood cell count, unspecified 12/17/2021   Sepsis due to methicillin susceptible Staphylococcus aureus (HCC) 12/17/2021   Lower back pain 12/17/2021   ESRD on hemodialysis (HCC) 12/17/2021   Encounter for screening for other viral diseases 11/03/2021   Allergy, unspecified, initial encounter 09/14/2021   Coagulation defect, unspecified (HCC) 09/14/2021   Complication of vascular  dialysis catheter 09/14/2021   Gout due to renal impairment, right ankle and foot 09/14/2021   Iron deficiency anemia, unspecified 09/14/2021   Pain, unspecified 09/14/2021   Pruritus, unspecified 09/14/2021   Secondary hyperparathyroidism of renal origin (HCC) 09/14/2021   Shortness of breath 09/14/2021   Unspecified protein-calorie malnutrition (HCC) 09/14/2021   Vitamin D deficiency 01/02/2021   Hyperparathyroidism (HCC) 12/31/2020   CKD (chronic kidney disease), stage III (HCC) 11/10/2019   Cardiomegaly 11/05/2019   Hyperkalemia 11/05/2019   Hypokalemia 10/18/2019   Pneumonia due to COVID-19 virus 10/17/2019   Hypertension    Asthma    Gout    Hyponatremia    Multifactorial anemia-acute blood loss from bleeding HD catheter site-superimposed on anemia related to ESRD.       Subjective:    Patient ID: Diamond [name], female    DOB: 1951-12-03, 73 y.o.   MRN: [id number]  Chief Complaint  Patient presents with   Follow-up    Vertebral osteomyelitis [facility])       HPI:  Diamond [name] is a 73 y.o. female esrd on iHD, recurrent mrsa bacteremia, recent finished 4 weeks iv cefazolin  07/13/22 for mrsa bacteremia then readmitted 2 weeks later 8/22 for acute on chronic back pain with mri concerning for l2-3 paravertebral edema and still elevated crp, s/p 8 weeks vancomycin  by 12/11 then with recurrent mrsa bacteremia, here for f/u  12/20/23 id clinic f/u Patient here again from snf Intermittent back pain after getting up from dialysis chair but nothing persistent Feels well Good appetite No f/c No abx    09/13/23  id clinic f/u Patient came from her nursing home ([facility]) She was recently admitted 9/28-10/3/24 for pancreatitis I reviewed mar and it doesn't appear she is on any bactrim  at this time I reviewed H&P and discharge from above admission and I do not see bactrim  mentioned although osteomyelitis was mentioned She didn't bring any lab today but these are done during  dialysis session No new back pain   No fever, chill Eating well Feeling well Trouble with gas a lot   03/17/23 id clinic f/u Doing well in wheel chair non-ambulatory which is chronic even before infection Minimal back pain No f/c Coming to end of iv abx vanc Plan to transition to oral suppressive bactrim    02/17/23 id clinic f/u Patient was admitted 01/16/23 again for mrsa bsi (R tetra, S bactrim ) Recurrent episodes 12/2021, 06/2022, 09/2022, and 01/15/2023 Bcx this admission repeat 2/11 negative Tte thickened aortic valve; tee deferred as prolonged abx planned and bcx cleares Mri lumbar spine 01/23/23 showed persistent discistis/om changes at L2-3 and l4-5 with paravertebral phlegmon Left psoas abscess also noted to have progression from 11/2022 -- this was aspirted 2/20 by IR -- note mentioned sample sent for culture although I do not see that on epic Esrd - left AvF no sign of infection/evaluated by vascular surgery She was discharged on 8 weeks of vanc with dialysis until 03/13/23 She is here without labs today I am tempted to place her on a very long oral tail of bactrim    Today her pain is much better than 01/2023 admission She is staying at [facility] She has no complaint In a wheel chair, accompanied by her daughter    ------------ 01/05/23 id clinic visit: Patient got readmitted in the hospital 12/25-1/03 for fever. Negative w/u. Mri lumbar spine showed no new abscess but only evolving changes. Blood cx negative Only had fever x1 episode Crp was elevated but stable  ID team saw during this recent admission as well. She receive bactrim  renal dosing oral course and is here for f/u   No n/v/diarrhea/f/c/rash  Back pain not there until she gets up and start moving around. At maximum 7/10. This is most recent since she got out of rehab. During the midst of infection, the pain was very bad and constant   She feels a band tightness around lower chest wall last 2  days. She has been walking with a FWW. She is recently out of snf and back home a week prior to this visit  No cough  She thinks she takes bactrim  1 ss tablet daily but not sure. Her daughter prescribes it.      Prior id clinic visits ----------------- 09/15/22 id clinic f/u She finished bactrim  a couple weeks ago she think but not sure Pain same as last visit No worsening appetite No f/c No numbness/tingling No urinary incontinence  10/15/22 id clinic f/u Patient had fever and found to have recurrent mrsa bacteremia and also mri lumbar spine progression of l2-4 vertebral om and ventral epidural phlegmon 10/13 bcx mrsa (R tetra; sensitive to bactrim ) 10/16 bcx ngtd She was admitted to [place] and placed on 8 weeks vancomycin  with dialysis until planned 11/15/22 She has moderate back pain still but no fever chill. Same as 09/2022 No malaise/f/c No diarrhea   04/28/23 id clinic f/u She is not sure if she is taking bactrim  (as we advised from 03/2023 visit about indefinite therapy) I don't have her nursing home paperwork She has pain in lower  back not worse from before No n/v/diarrhea No f/c     Allergies  Allergen Reactions   Shellfish-Derived Products Other (See Comments)   Shrimp (Diagnostic) Other (See Comments)    Unknown reaction   Other Swelling and Rash    Anesthesia - facial swelling, rash      Outpatient Medications Prior to Visit  Medication Sig Dispense Refill   acetaminophen  (TYLENOL ) 500 MG tablet Take 1,000 mg by mouth every 8 (eight) hours as needed (for back and leg pain). Take 2 tablets (1000mg  total) by mouth every 8 hours as needed for back and leg pain.     albuterol  (VENTOLIN  HFA) 108 (90 Base) MCG/ACT inhaler Inhale 2 puffs into the lungs every 4 (four) hours as needed for wheezing or shortness of breath. 6.7 g 0   amiodarone  (PACERONE ) 200 MG tablet Take 1 tablet (200 mg total) by mouth daily.     cloNIDine  (CATAPRES ) 0.1 MG tablet Take  0.1 mg by mouth every 6 (six) hours as needed (SBP > 170).     Darbepoetin Alfa  (ARANESP ) 200 MCG/0.4ML SOSY injection Inject 0.4 mLs (200 mcg total) into the skin every Monday at 6 PM.     Diaper Rash Products (DESITIN MULTI-PURPOSE HEALING) OINT Apply 1 Application topically 3 (three) times daily as needed (with incontinence to prevent skin breakdown).     Emollient (MINERIN) LOTN Apply 1 Application topically 3 (three) times daily as needed (dry skin).     fluticasone -salmeterol (ADVAIR DISKUS) 250-50 MCG/ACT AEPB Inhale 1 puff into the lungs in the morning and at bedtime. 14 each 0   gabapentin  (NEURONTIN ) 300 MG capsule Take 300 mg by mouth 3 (three) times a week. Take on Mondays, Wednesdays and Fridays after dialysis.     hydrALAZINE  (APRESOLINE ) 50 MG tablet Take 50 mg by mouth See admin instructions. Take 1 tablet by mouth two times daily on Mon-Wed-Fri, and 1 tablet by mouth three times daily on Tues-Thurs-Sat.     HYDROcodone -acetaminophen  (NORCO) 7.5-325 MG tablet Take 1 tablet by mouth 2 (two) times daily.     levothyroxine  (SYNTHROID ) 25 MCG tablet Take 25 mcg by mouth daily before breakfast.     lidocaine  4 % Place 1 patch onto the skin daily. Apply for 12 hours to lower back and leave off for 12 hours between patches.     liver oil-zinc  oxide (DESITIN) 40 % ointment Apply 1 Application topically as needed (to buttocks area to prevent MASD).     Menthol , Topical Analgesic, (BIOFREEZE) 4 % GEL Apply 1 Application topically 3 (three) times daily as needed (lower back and knee pain).     metoprolol  tartrate (LOPRESSOR ) 50 MG tablet Take 1 tablet (50 mg total) by mouth 2 (two) times daily.     Nutritional Supplement LIQD Take 120 mLs by mouth 3 (three) times daily. Sugar free - record amount consumed     ondansetron  (ZOFRAN ) 4 MG tablet Take 4 mg by mouth every 6 (six) hours as needed for nausea or vomiting.     pantoprazole  (PROTONIX ) 40 MG tablet Take 1 tablet (40 mg total) by mouth daily.  30 tablet 0   sevelamer  (RENAGEL ) 800 MG tablet Take 800 mg by mouth See admin instructions. Take 2 tablets (1600mg  total) by mouth 3 times a day every Tuesday, Thursday and Saturday. Take 2 tablets (1600mg  total) with meals every Monday, Wednesday, Friday and Sunday.     No facility-administered medications prior to visit.     Social History  Socioeconomic History   Marital status: Widowed    Spouse name: Not on file   Number of children: Not on file   Years of education: Not on file   Highest education level: Not on file  Occupational History   Not on file  Tobacco Use   Smoking status: Never    Passive exposure: Never   Smokeless tobacco: Never  Vaping Use   Vaping status: Never Used  Substance and Sexual Activity   Alcohol use: No   Drug use: No   Sexual activity: Not Currently    Birth control/protection: Post-menopausal  Other Topics Concern   Not on file  Social History Narrative   Not on file   Social Drivers of Health   Financial Resource Strain: Not on file  Food Insecurity: No Food Insecurity (09/03/2023)   Hunger Vital Sign    Worried About Running Out of Food in the Last Year: Never true    Ran Out of Food in the Last Year: Never true  Transportation Needs: No Transportation Needs (09/03/2023)   PRAPARE - Administrator, Civil Service (Medical): No    Lack of Transportation (Non-Medical): No  Physical Activity: Not on file  Stress: Not on file  Social Connections: Moderately Integrated (07/20/2022)   Social Connection and Isolation Panel [NHANES]    Frequency of Communication with Friends and Family: Three times a week    Frequency of Social Gatherings with Friends and Family: Three times a week    Attends Religious Services: 1 to 4 times per year    Active Member of Clubs or Organizations: Yes    Attends Banker Meetings: 1 to 4 times per year    Marital Status: Widowed  Intimate Partner Violence: Not At Risk (09/03/2023)    Humiliation, Afraid, Rape, and Kick questionnaire    Fear of Current or Ex-Partner: No    Emotionally Abused: No    Physically Abused: No    Sexually Abused: No      Review of Systems    All other ros negative  Objective:    BP 132/81   Pulse 61   Temp 97.6 F (36.4 C) (Temporal)   Resp 16   SpO2 97%  Nursing note and vital signs reviewed.  Physical Exam     General/constitutional: no distress, pleasant; in wheel chair HEENT: Normocephalic, PER, Conj Clear, EOMI, Oropharynx clear Neck supple CV: rrr no mrg Lungs: clear to auscultation, normal respiratory effort Abd: Soft, Nontender Ext: no edema Skin: No Rash Neuro: nonfocal - in wheel chair; bilateral LE 4/5 strength MSK: no peripheral joint swelling/tenderness/warmth; back spines nontender         Labs: Lab Results  Component Value Date   WBC 3.0 (L) 09/13/2023   HGB 8.8 (L) 09/13/2023   HCT 27.9 (L) 09/13/2023   MCV 102.6 (H) 09/13/2023   PLT 149 09/13/2023   Last metabolic panel Lab Results  Component Value Date   GLUCOSE 109 (H) 09/13/2023   NA 139 09/13/2023   K 4.2 09/13/2023   CL 97 (L) 09/13/2023   CO2 31 09/13/2023   BUN 25 09/13/2023   CREATININE 6.14 (H) 09/13/2023   GFRNONAA 6 (L) 09/07/2023   CALCIUM  9.6 09/13/2023   PHOS 4.3 09/07/2023   PROT 6.2 09/13/2023   ALBUMIN  2.3 (L) 09/07/2023   LABGLOB 3.6 10/17/2019   AGRATIO 0.6 (L) 10/17/2019   BILITOT 0.5 09/13/2023   ALKPHOS 85 09/02/2023   AST 13 09/13/2023  ALT 9 09/13/2023   ANIONGAP 11 09/07/2023       Component Value Date/Time   CRP 12.0 (H) 09/13/2023 1153   CRP 3.8 04/28/2023 0319   CRP 41.1 (H) 03/17/2023 0214    Micro:  Serology:  Imaging: I personally reviewed and incorporated into decision making  10/18 tte  1. Left ventricular ejection fraction, by estimation, is 60 to 65%. The  left ventricle has normal function. The left ventricle has no regional  wall motion abnormalities. There is mild  concentric left ventricular  hypertrophy. Left ventricular diastolic  function could not be evaluated.   2. Right ventricular systolic function is normal. The right ventricular  size is normal. There is normal pulmonary artery systolic pressure.   3. Left atrial size was moderately dilated.   4. Previously noted mobile mass in the RA not visible on this study.  Right atrial size was mildly dilated.   5. The mitral valve is abnormal. Mild mitral valve regurgitation. No  evidence of mitral stenosis.   6. Tricuspid valve regurgitation is mild to moderate.   7. The aortic valve is tricuspid. There is mild calcification of the  aortic valve. Aortic valve regurgitation is trivial. Aortic valve  sclerosis/calcification is present, without any evidence of aortic  stenosis. Aortic valve area, by VTI measures 2.96  cm. Aortic valve mean gradient measures 3.0 mmHg. Aortic valve Vmax  measures 1.24 m/s.   8. Aortic dilatation noted. There is borderline dilatation of the  ascending aorta, measuring 38 mm.   9. The inferior vena cava is dilated in size with <50% respiratory  variability, suggesting right atrial pressure of 15 mmHg.    01/23/23 mri lumbar spine 1. Transitional L5 vertebra as previously numbered. 2. L2-3 discitis/osteomyelitis with purulence in a chronic L3 body fracture. Progression of left psoas involvement at this level to an abscess appearance measuring up to 2.5 cm. There may be facet arthritis on the right at this level. Posterior epidural phlegmon/collection combines with degeneration to cause high-grade thecal sac narrowing at this level. 3. L4-5 collapse and endplate destruction from more chronic discitis/osteomyelitis. 4. L3-4 and L4-5 advanced degenerative spinal stenosis.  10/13 mri lumbar spine 1. Findings are concerning for progression of discitis/osteomyelitis with increased soft tissue extent of infection in the paraspinal and epidural spaces. Compared to prior  exam from 07/26/2022, there is increased abnormal soft tissue in the ventral epidural space at the L2-L3 and L3-L4 disc space levels resulting in moderate to severe spinal canal narrowing at these levels. There is also increased signal abnormality in the surrounding paraspinal soft tissues and bilateral psoas musculature. 2. Persistent severe spinal canal narrowing at the L5-S1 level secondary to a central disc protrusion. 3. Stable compression deformities of the L2, L3, L4, and L5 vertebral bodies with irregular endplate erosion at the L4-L5 level, best seen on CT of the L-spine obtained on 09/16/2022.  12/26 mri lumbar spine 1. Unchanged findings of discitis-osteomyelitis at L2-L3 and L4-L5 with worsening paravertebral inflammatory changes. No paravertebral or epidural abscess. 2. Unchanged mild-to-moderate L2-L3, moderate to severe L3-L4, and moderate L4-L5 spinal canal stenosis.     Assessment & Plan:   Problem List Items Addressed This Visit     Vertebral osteomyelitis (HCC) - Primary     73 y.o. female esrd on iHD, recurrent mrsa bacteremia, recent finished 4 weeks iv cefazolin  07/13/22 for mrsa bacteremia then readmitted 2 weeks later 8/22 for acute on chronic back pain with mri concerning for l2-3 paravertebral edema  and still elevated crp, s/p 8 weeks vancomycin  by 11/15/22 then with recurrent mrsa bacteremia, here for f/u  09/15/22 Will see what crp is doing Doesn't appear her infection is active at this time Exam showed no tenderness and the pain suspected to be neuropathic  Will repeat lab again and evaluation in 4 weeks Advise complex pain clinic management  If crp continues to go up will consider mri  Will start gabapentin   Advise she talks to pcp about  pain clinic referral   -------- 10/15/22 assessment Recurrent bacteremia mrsa with lumbar spine om; no frank epidural abscess Back pain stable Getting vanc with iHD until 11/15/2022 Will reassess then  to see if she still needs oral abx tail  Trial of lidocaine  patch -- rx Increase gabapentin  gradually to 300 mg qhs -- renew rx  See me around 2nd week of December  ------------ 01/05/23 id clinic f/u Recent fever 12/25 negative sepsis w/u. Crp stable high at low teens. Mri lumbar spine 12/26 no new abscess or discitis Finished 8 weeks 11/15/22 but given bactrim  during that admission She is 4-5 months into abx course now  Will stop today as clinically improved   Will see her again in 4-5 weeks off antibiotics and check crp and clinicaly exam to ascertain remission. See me sooner if sign of infection    02/17/23 id clinic assessment 4th episodes mrsa bsi with relapse psoas abscess and lumbar om Back on vanc since 01/15/23 (about 2weeks after finish several months abx for same with improving mri and sx)  Will continue 8 weeks vanc until 4/14 then transition to at least  months bactrim   F/u around 4/14 to get on oral abx  03/17/23 id assessment Will transition to bactrim  ds 1 tab po bid today  Plan at least 3 months and if tolerate I might push indefinitely given several relapse in the past year  F/u 6 weeks repeat crp and see how she does    04/28/23 id assessment Clinically stable no sign of sepsis or worsening back pain Called nursing home and verified that she takes 1 tablet bid  Will continue bactrim  ds 1 t  Crp today   09/13/23 id clinic assessment Crp normal last visit Appear the nursing home might have stopped her bactrim  suppression/prophy -- mar doesn't show anymore. Patient not awared if she is taken off or taking it Called [facility] and they are not clear as well when it was stopped (they confirm she wasn't on bactrim ) 9/28-10/3/24 [place] admission also didn't mention any bactrim  so might have been accidentally stopped before that Explained to her if she has been off and doing well we can keep off an monitor F/u 3 months or sooner if persistent new back  pain/fever/chill. If she does well then, will discharge from clinic    12/20/23 id clinic assessment Patient is feeling well no back symptoms, fever, chill Continues to be off bactrim  as we discussed since 09/2023 visit Clinically doesn't appear to have any relapse of her spine infection I do not feel labs is needed today based on her history and exam  No need for follow up with our id clinic further Discuss with her though being a dialysis patient and having prior back infection, her risk for new infection is always higher and to be vigilant about any persistent/progressive back or joint pain or fever/chill then let me know  Follow-up: No follow-ups on file.      Constance ONEIDA Passer, MD [facility]  for Infectious Disease [facility] 12/20/2023, 2:44 PM

## 2023-12-20 NOTE — Patient Instructions (Signed)
 As you continue to do well off antibiotics for your back infection (it has been at least 3 months), this suggest that our infection has resolved   However, because your skin is poked 3 times a week for dialysis and being on dialysis is immunosuppressed, and also with hx of back infection, you are at higher risk for new infection   So if any fever, chill, persistent back, or joint pain with progression please let me know right away   No need though to make any follow up at this time with me

## 2023-12-21 ENCOUNTER — Ambulatory Visit: Payer: Medicare (Managed Care) | Admitting: Internal Medicine

## 2023-12-26 NOTE — Progress Notes (Unsigned)
Cardiology Office Note    Date:  12/27/2023  ID:  Diamond, [name] 07-Apr-1951, MRN [id number] PCP:  [facility]  Cardiologist:  Meriam Sprague, MD (Inactive)  Electrophysiologist:  None   Chief Complaint: f/u AFib  History of Present Illness: .    Diamond [name] is a 73 y.o. female with visit-pertinent history of ESRD on HD, MRSA bacteremia with IE with recurrent episodes of bacteremia, paroxysmal atrial fib/flutter, HTN, coronary/aortic calcifications by CT, 4cm ascending TAA by CT 11/2023, anemia of chronic disease, asthma, hypothyroidism, gout, chronic hepatitis C, lung nodules, gallbladder mass benign s/p cholecystectomy 06/2022, RBBB seen for follow-up.    Diamond [name] was initially seen by our team in 12/2021 during admission for severe sepsis with MRSA bacteremia, L5-S1 discitis and right atrial/IVC mass. She underwent TEE with 2.1x1.4cm multilobulated mobile mass attached to the RA/IVC junction 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 vs vegtation at prior HD catheter site. She was managed with antibiotics and apixaban. During admission she developed atrial flutter. Apixaban had to be temporarily held due to bleeding at her IJ site with hypotension requiring transfusion. ID helped to coordinate her abx, reported to have received 8 weeks of IV vancomycin. She was able to be discharged on Eliquis. At office f/u 01/2022 she was back in NSR. She has had multiple readmissions since that time for bacteremia +/- discitis with multiple medical issues and encephalopathy requiring requiring additional abx and eventual Bactrim suppression, frequently complicated by Afib RVR. Subsequent TEE 06/2022 and echoes have not demonstrated any recurrent endocarditis. During 09/2022 admission she was trialed on IV amiodarone for her AFib with conversion to NSR but had concern for QT prolongation (subsequently normal on hand calculation). She was again admitted 01/2023 when she was admitted for  osteomyelitis discitis and MRSA bacteremia complicated by AF RVR. She was treated with amiodarone and converted back to NSR. 2D echo 01/17/23 EF 60-65%, G2DD, mildly elevated PASP, no valvular vegetations. She was a poor historian and encephalopathic at that time. Her family did not want to pursue repeat TEE. It was unclear how a TEE would really change management given her ongoing f/u with ID.  She was last admitted 9/27-10/3/24 for pancreatitis, admission also notable for worsening anemia without evidence of obvious bleeding requiring transfusion, multiple ancillary findings on CT imaging, with subsequent CT chest imaging 11/2023 showing improved consolidation, stable lung nodules, cardiomegaly, coronary/aortic calcifications, 4cm mid ascending TAA, dilation of the eft subclavian vein and visualized left upper extremity veins due to AV fistula, and degenerative bone changes. Eliquis was discontinued by the medicine team due to her anemia as they were concerned for a component of occult GIB as well. She also has history of intermittently low BP at HD. She saw ID last 12/20/23 and had been noted to be off Bactrim without clear reason why though in the setting of stability Dr. Renold Don recommended she remain off and f/u PRN going forward.  She returns for follow-up today alone, brought by transportation from [facility]. She does not know what medications she is taking. She lives at [facility] and our office will call to get a med list. She reports she is feeling great without any chest pain, SOB, palpitations, bleeding, or issues with BP at HD. She is happy to be feeling well.   Labwork independently reviewed: 09/2023 K 4.2, Cr 6.14, alb 3.3, AST ALT OK, Hgb 8.8, plt 149 04/2023 TSH 13.1  ROS: .    Please  see the history of present illness.  All other systems are reviewed and otherwise negative.  Studies Reviewed: Marland Kitchen    EKG:  EKG is ordered today, personally reviewed, demonstrating SB 58bpm, LAD, RBBB,  no acute change from prior  CV Studies: Cardiac studies reviewed are outlined and summarized above. Otherwise please see EMR for full report.   Current Reported Medications:.    Patient does not know current medications. Will need to call to obtain.  Physical Exam:    VS:  BP 130/80 (BP Location: Left Arm, Patient Position: Sitting, Cuff Size: Normal)   Pulse 60   Resp 16   Ht 5\' 6"  (1.676 m)   Wt 111 lb (50.3 kg)   SpO2 97%   BMI 17.92 kg/m    Wt Readings from Last 3 Encounters:  12/27/23 111 lb (50.3 kg)  09/05/23 111 lb 1.8 oz (50.4 kg)  08/17/23 130 lb 11.7 oz (59.3 kg)    GEN: Thin well developed in no acute distress NECK: No JVD; No carotid bruits CARDIAC: RRR, no murmurs, rubs, gallops RESPIRATORY:  Clear to auscultation without rales, wheezing or rhonchi  ABDOMEN: Soft, non-tender, non-distended EXTREMITIES:  No edema; No acute deformity   Asessement and Plan:.    1. Paroxysmal atrial fibrillation/flutter - maintaining NSR HR 58bpm. She is intended to be on amiodarone 200mg  daily and metoprolol 50mg  BID, but we do not have an active med list on her. I have asked our staff to call the facility to clarify her medication list. Anticipate continuation of present cardiac regimen. Obtain f/u CMET, CBC, TSH, free T4 today. Reminded to get yearly eye exam given ongoing amiodarone therapy. Last CT chest reviewed, OK to continue present regimen given complexity of history.  Eliquis was discontinued in 09/2023 by the medicine team due to recurrent anemia requiring transfusion with concern for occult GI bleeding. She has had multiple presentations with Hgb in the 6 range. I discussed with Dr. Elberta Fortis (DOD) in Dr. Devin Going departure. We feel she is a poor candidate for resumption of anticoagulation, OK to hold off resuming given that she remains in NSR as well. He also does not feel she would be a candidate for any advanced EP procedures including Watchman.  2. H/o endocarditis -  defer management to ID; they felt she could remain off abx per last OV. Her previous episode was question endocarditis vs thrombus at prior HD catheter site. She is now off anticoagulation due to recurrent anemia. Observe.  3. RBBB - follow, no advancing bradycardia seen.  4. Mild ascending TAA - noted by CT chest 11/2023. Consider reassessment 11/2024 if clinically appropriate.   5. Coronary/aortic atherosclerosis - no recent angina or dyspnea noted; no further ischemic workup indicated at this time. Avoid ASA with recurrent anemia issues. Will defer lipid management to primary care in setting of ESRD.    ADDENDUM: Post visit phone call revealed the following med list. No change to plan above. Since AVS was printed after her visit pulling in her historical medicine list, have asked staff to call [facility] back to let them know we did not make any additional medicine changes/disregard paper AVS and that her facility should go by their current list.  Current Meds  Medication Sig   acetaminophen (TYLENOL) 500 MG tablet Take 1,000 mg by mouth every 8 (eight) hours as needed (for back and leg pain). Take 2 tablets (1000mg  total) by mouth every 8 hours as needed for back and leg pain.   amiodarone (PACERONE)  200 MG tablet Take 1 tablet (200 mg total) by mouth daily.   Darbepoetin Alfa (ARANESP) 200 MCG/0.4ML SOSY injection Inject 0.4 mLs (200 mcg total) into the skin every Monday at 6 PM.   gabapentin (NEURONTIN) 300 MG capsule Take 300 mg by mouth 3 (three) times a week. Take on Mondays, Wednesdays and Fridays after dialysis.   HYDROcodone-acetaminophen (NORCO) 7.5-325 MG tablet Take 1 tablet by mouth in the morning, at noon, and at bedtime.   lidocaine 4 % Place 1 patch onto the skin daily. Apply for 12 hours to lower back and leave off for 12 hours between patches.   metoprolol tartrate (LOPRESSOR) 50 MG tablet Take 1 tablet (50 mg total) by mouth 2 (two) times daily.   simethicone (MYLICON) 80 MG  chewable tablet Chew 80 mg by mouth as needed for flatulence.   Vitamin D, Ergocalciferol, (DRISDOL) 1.25 MG (50000 UNIT) CAPS capsule Take 50,000 Units by mouth daily.     Disposition: F/u with Dr. Izora Ribas to establish care in 6 months.  Signed, Laurann Montana, PA-C

## 2023-12-27 ENCOUNTER — Encounter: Payer: Self-pay | Admitting: Physician Assistant

## 2023-12-27 ENCOUNTER — Ambulatory Visit: Payer: Medicare (Managed Care) | Attending: Physician Assistant | Admitting: Physician Assistant

## 2023-12-27 VITALS — BP 130/80 | HR 60 | Resp 16 | Ht 66.0 in | Wt 111.0 lb

## 2023-12-27 DIAGNOSIS — I700 Atherosclerosis of aorta: Secondary | ICD-10-CM

## 2023-12-27 DIAGNOSIS — I2510 Atherosclerotic heart disease of native coronary artery without angina pectoris: Secondary | ICD-10-CM

## 2023-12-27 DIAGNOSIS — Z79899 Other long term (current) drug therapy: Secondary | ICD-10-CM

## 2023-12-27 DIAGNOSIS — Z5181 Encounter for therapeutic drug level monitoring: Secondary | ICD-10-CM

## 2023-12-27 DIAGNOSIS — I480 Paroxysmal atrial fibrillation: Secondary | ICD-10-CM | POA: Diagnosis not present

## 2023-12-27 DIAGNOSIS — I4510 Unspecified right bundle-branch block: Secondary | ICD-10-CM

## 2023-12-27 DIAGNOSIS — Z8679 Personal history of other diseases of the circulatory system: Secondary | ICD-10-CM | POA: Diagnosis not present

## 2023-12-27 DIAGNOSIS — I4892 Unspecified atrial flutter: Secondary | ICD-10-CM

## 2023-12-27 DIAGNOSIS — I10 Essential (primary) hypertension: Secondary | ICD-10-CM

## 2023-12-27 DIAGNOSIS — I7121 Aneurysm of the ascending aorta, without rupture: Secondary | ICD-10-CM

## 2023-12-27 NOTE — Patient Instructions (Addendum)
  Medication Instructions:   Your physician recommends that you continue on your current medications as directed. Please refer to the Current Medication list given to you today.   *If you need a refill on your cardiac medications before your next appointment, please call your pharmacy*   Lab Work:  TODAY!!!! CMET/TSH/CBC/FREE T4  If you have labs (blood work) drawn today and your tests are completely normal, you will receive your results only by: MyChart Message (if you have MyChart) OR A paper copy in the mail If you have any lab test that is abnormal or we need to change your treatment, we will call you to review the results.   Testing/Procedures:  Keep echo appointment.    Follow-Up: At [facility], you and your health needs are our priority.  As part of our continuing mission to provide you with exceptional heart care, we have created designated Provider Care Teams.  These Care Teams include your primary Cardiologist (physician) and Advanced Practice Providers (APPs -  Physician Assistants and Nurse Practitioners) who all work together to provide you with the care you need, when you need it.  We recommend signing up for the patient portal called "MyChart".  Sign up information is provided on this After Visit Summary.  MyChart is used to connect with patients for Virtual Visits (Telemedicine).  Patients are able to view lab/test results, encounter notes, upcoming appointments, etc.  Non-urgent messages can be sent to your provider as well.   To learn more about what you can do with MyChart, go to [email or web address].    Your next appointment:   6 month(s)  Provider:   Dr. Izora Ribas     Other Instructions    1st Floor: - Lobby - Registration  - Pharmacy  - Lab - Cafe  2nd Floor: - PV Lab - Diagnostic Testing (echo, CT, nuclear med)  3rd Floor: - Vacant  4th Floor: - TCTS (cardiothoracic surgery) - AFib Clinic - Structural Heart Clinic -  Vascular Surgery  - Vascular Ultrasound  5th Floor: - [facility] (general and EP) - Clinical Pharmacy for coumadin, hypertension, lipid, weight-loss medications, and med management appointments    Valet parking services will be available as well.    Your physician wants you to follow-up in: 6 months.  You will receive a reminder letter in the mail two months in advance. If you don't receive a letter, please call our office to schedule the follow-up appointment.         Patients on amiodarone should be getting a yearly eye exam by an ophthalmologist. Please schedule if you have not had your eyes checked within the last year.

## 2024-01-05 ENCOUNTER — Ambulatory Visit (HOSPITAL_COMMUNITY): Payer: Medicare (Managed Care) | Attending: Internal Medicine

## 2024-01-05 DIAGNOSIS — I480 Paroxysmal atrial fibrillation: Secondary | ICD-10-CM | POA: Insufficient documentation

## 2024-01-05 LAB — ECHOCARDIOGRAM COMPLETE
AR max vel: 2.35 cm2
AV Area VTI: 2.25 cm2
AV Area mean vel: 2.18 cm2
AV Mean grad: 3.3 mm[Hg]
AV Peak grad: 6.1 mm[Hg]
Ao pk vel: 1.23 m/s
Area-P 1/2: 3.34 cm2
S' Lateral: 3 cm

## 2024-05-25 ENCOUNTER — Emergency Department (HOSPITAL_COMMUNITY): Payer: Medicare (Managed Care)

## 2024-05-25 ENCOUNTER — Emergency Department (HOSPITAL_COMMUNITY)
Admission: EM | Admit: 2024-05-25 | Discharge: 2024-05-26 | Disposition: A | Discharge status: Home / Self Care | Payer: Medicare (Managed Care) | Attending: Emergency Medicine | Admitting: Emergency Medicine

## 2024-05-25 ENCOUNTER — Other Ambulatory Visit: Payer: Self-pay

## 2024-05-25 ENCOUNTER — Encounter (HOSPITAL_COMMUNITY): Payer: Self-pay

## 2024-05-25 DIAGNOSIS — R109 Unspecified abdominal pain: Secondary | ICD-10-CM | POA: Diagnosis present

## 2024-05-25 DIAGNOSIS — E875 Hyperkalemia: Secondary | ICD-10-CM | POA: Diagnosis not present

## 2024-05-25 DIAGNOSIS — R1084 Generalized abdominal pain: Secondary | ICD-10-CM

## 2024-05-25 LAB — COMPREHENSIVE METABOLIC PANEL WITH GFR
ALT: 8 U/L (ref 0–44)
AST: 17 U/L (ref 15–41)
Albumin: 2.3 g/dL — ABNORMAL LOW (ref 3.5–5.0)
Alkaline Phosphatase: 67 U/L (ref 38–126)
Anion gap: 20 — ABNORMAL HIGH (ref 5–15)
BUN: 48 mg/dL — ABNORMAL HIGH (ref 8–23)
CO2: 22 mmol/L (ref 22–32)
Calcium: 8.4 mg/dL — ABNORMAL LOW (ref 8.9–10.3)
Chloride: 92 mmol/L — ABNORMAL LOW (ref 98–111)
Creatinine, Ser: 8.6 mg/dL — ABNORMAL HIGH (ref 0.44–1.00)
GFR, Estimated: 4 mL/min — ABNORMAL LOW (ref >60)
Glucose, Bld: 114 mg/dL — ABNORMAL HIGH (ref 70–99)
Potassium: 5.2 mmol/L — ABNORMAL HIGH (ref 3.5–5.1)
Sodium: 134 mmol/L — ABNORMAL LOW (ref 135–145)
Total Bilirubin: 1.3 mg/dL — ABNORMAL HIGH (ref 0.0–1.2)
Total Protein: 6.3 g/dL — ABNORMAL LOW (ref 6.5–8.1)

## 2024-05-25 LAB — CBC
HCT: 44.6 % (ref 36.0–46.0)
Hemoglobin: 15 g/dL (ref 12.0–15.0)
MCH: 32.7 pg (ref 26.0–34.0)
MCHC: 33.6 g/dL (ref 30.0–36.0)
MCV: 97.2 fL (ref 80.0–100.0)
Platelets: 108 10*3/uL — ABNORMAL LOW (ref 150–400)
RBC: 4.59 MIL/uL (ref 3.87–5.11)
RDW: 17.9 % — ABNORMAL HIGH (ref 11.5–15.5)
WBC: 5.7 10*3/uL (ref 4.0–10.5)
nRBC: 0 % (ref 0.0–0.2)

## 2024-05-25 LAB — LIPASE, BLOOD: Lipase: 31 U/L (ref 11–51)

## 2024-05-25 LAB — TROPONIN I (HIGH SENSITIVITY)
Troponin I (High Sensitivity): 23 ng/L — ABNORMAL HIGH (ref <18)
Troponin I (High Sensitivity): 24 ng/L — ABNORMAL HIGH (ref <18)

## 2024-05-25 MED ORDER — IOHEXOL 350 MG/ML SOLN
75.0000 mL | Freq: Once | INTRAVENOUS | Status: AC | PRN
  Administered 2024-05-25: 75 mL via INTRAVENOUS

## 2024-05-25 MED ORDER — SODIUM ZIRCONIUM CYCLOSILICATE 10 G PO PACK
10.0000 g | PACK | Freq: Once | ORAL | Status: AC
  Administered 2024-05-26: 10 g via ORAL
  Filled 2024-05-25: qty 1

## 2024-05-25 NOTE — ED Triage Notes (Signed)
 BIB EMS from [facility] complains of abd pain x 3 days. Nausea without vomiting or diarrhea. Missed dialysis today due to not feeling well.   EMS VS 130/78 66 HR 16 RR 94% RA

## 2024-05-25 NOTE — ED Provider Notes (Signed)
 [place] EMERGENCY DEPARTMENT AT [facility] Provider Note   CSN: [id number] Arrival date & time: 05/25/24  1747     Patient presents with: Abdominal Pain   Diamond [name] is a 73 y.o. female.   HPI 73 year old female presents with abdominal pain.  History is from patient as well as the nurse at her rehab facility.  Patient has been having abdominal pain for a couple days and has been feeling tired for 2-3 days.  The nurse notes that she has had abdominal and back pain and the family was requesting her to be evaluated today.  Patient endorses a cough as well and thinks her abdomen has been hurting due to that.  Currently the patient denies any abdominal pain though states it has been in her left lower quadrant.  No urinary symptoms, vomiting, diarrhea but she has had nausea.  She has felt all over tired but no focal weakness, headache, chest pain.  Patient does MWF dialysis and last went 2 days ago on Wednesday.  Did not go today because she was not feeling well and did not want to get up out of bed.  Prior to Admission medications   Medication Sig Start Date End Date Taking? Authorizing Provider  acetaminophen  (TYLENOL ) 500 MG tablet Take 1,000 mg by mouth every 8 (eight) hours as needed (for back and leg pain). Take 2 tablets (1000mg  total) by mouth every 8 hours as needed for back and leg pain.    [provider]  amiodarone  (PACERONE ) 200 MG tablet Take 1 tablet (200 mg total) by mouth daily. 01/27/23   Ghimire, Kuber, MD  Darbepoetin Alfa  (ARANESP ) 200 MCG/0.4ML SOSY injection Inject 0.4 mLs (200 mcg total) into the skin every Monday at 6 PM. 09/12/23   Dameron, Raymona Caldwell, DO  gabapentin  (NEURONTIN ) 300 MG capsule Take 300 mg by mouth 3 (three) times a week. Take on Mondays, Wednesdays and Fridays after dialysis.    [provider]  HYDROcodone -acetaminophen  (NORCO) 7.5-325 MG tablet Take 1 tablet by mouth in the morning, at noon, and at bedtime.    [provider]  lidocaine  4 % Place 1 patch onto the skin daily. Apply for 12 hours to lower back and leave off for 12 hours between patches.    [provider]  metoprolol  tartrate (LOPRESSOR ) 50 MG tablet Take 1 tablet (50 mg total) by mouth 2 (two) times daily. 01/26/23   Vada Garibaldi, MD  simethicone  (MYLICON) 80 MG chewable tablet Chew 80 mg by mouth as needed for flatulence.    [provider]  Vitamin D, Ergocalciferol, (DRISDOL) 1.25 MG (50000 UNIT) CAPS capsule Take 50,000 Units by mouth daily.    [provider]    Allergies: Shellfish-derived products, Shrimp (diagnostic), and Other    Review of Systems  Constitutional:  Positive for fatigue. Negative for fever.  Respiratory:  Positive for cough. Negative for shortness of breath.   Cardiovascular:  Negative for chest pain.  Gastrointestinal:  Positive for abdominal pain and nausea. Negative for diarrhea and vomiting.  Neurological:  Negative for weakness and headaches.    Updated Vital Signs BP 128/79 (BP Location: Right Arm)   Pulse 68   Temp 98.4 F (36.9 C) (Oral)   Resp 14   Ht 5' 6 (1.676 m)   Wt 49.9 kg   SpO2 95%   BMI 17.75 kg/m   Physical Exam Vitals and nursing note reviewed.  Constitutional:      General: She is  not in acute distress.    Appearance: She is well-developed. She is not ill-appearing or diaphoretic.  HENT:     Head: Normocephalic and atraumatic.   Cardiovascular:     Rate and Rhythm: Normal rate and regular rhythm.     Heart sounds: Normal heart sounds.  Pulmonary:     Effort: Pulmonary effort is normal.     Breath sounds: Normal breath sounds.  Abdominal:     Palpations: Abdomen is soft.     Tenderness: There is no abdominal tenderness.   Skin:    General: Skin is warm and dry.   Neurological:     Mental Status: She is alert.     Comments: Clear speech, no facial droop. 5/5 strength in all 4 extremities.     (all labs ordered are listed, but only  abnormal results are displayed) Labs Reviewed  COMPREHENSIVE METABOLIC PANEL WITH GFR - Abnormal; Notable for the following components:      Result Value   Sodium 134 (*)    Potassium 5.2 (*)    Chloride 92 (*)    Glucose, Bld 114 (*)    BUN 48 (*)    Creatinine, Ser 8.60 (*)    Calcium  8.4 (*)    Total Protein 6.3 (*)    Albumin  2.3 (*)    Total Bilirubin 1.3 (*)    GFR, Estimated 4 (*)    Anion gap 20 (*)    All other components within normal limits  CBC - Abnormal; Notable for the following components:   RDW 17.9 (*)    Platelets 108 (*)    All other components within normal limits  TROPONIN I (HIGH SENSITIVITY) - Abnormal; Notable for the following components:   Troponin I (High Sensitivity) 23 (*)    All other components within normal limits  TROPONIN I (HIGH SENSITIVITY) - Abnormal; Notable for the following components:   Troponin I (High Sensitivity) 24 (*)    All other components within normal limits  LIPASE, BLOOD  URINALYSIS, ROUTINE W REFLEX MICROSCOPIC    ED ECG REPORT   Date: 05/25/2024  Rate: 68  Rhythm: normal sinus rhythm  QRS Axis: normal  Intervals: normal  ST/T Wave abnormalities: nonspecific T wave changes  Conduction Disutrbances:none  Narrative Interpretation:   Old EKG Reviewed: unchanged  I have personally reviewed the EKG tracing and agree with the computerized printout as noted.   Radiology: DG Chest 2 View Result Date: 05/25/2024 CLINICAL DATA:  Cough and abdominal pain. EXAM: CHEST - 2 VIEW COMPARISON:  08/17/2023. FINDINGS: A prominent cardiac silhouette. Upper lung vascular congestion. Ectatic calcified thoracic aorta. No pneumothorax. Minimal left-sided pleural effusion. Osseous structures grossly intact. IMPRESSION: Findings suggest mild CHF. Electronically Signed   By: Sydell Eva M.D.   On: 05/25/2024 20:04     Procedures   Medications Ordered in the ED - No data to display                                  Medical  Decision Making Amount and/or Complexity of Data Reviewed Labs: ordered.    Details: K 5.2 Radiology: ordered and independent interpretation performed.    Details: No Pneumonia ECG/medicine tests: ordered and independent interpretation performed.    Details: No acute ischemia  Risk Prescription drug management.   Patient's labs show a mild hyperkalemia. No EKG changes concerning this. CT abd/pelvis pending. Care transferred to Dr. Lula Sale.  Final diagnoses:  None    ED Discharge Orders     None          Jerilynn Montenegro, MD 05/25/24 2316

## 2024-05-26 MED ORDER — AZITHROMYCIN 250 MG PO TABS
250.0000 mg | ORAL_TABLET | Freq: Every day | ORAL | 0 refills | Status: AC
Start: 2024-05-26 — End: ?

## 2024-05-26 MED ORDER — LOKELMA 10 G PO PACK: PACK | ORAL | 0 refills | Status: AC

## 2024-05-26 MED ORDER — AZITHROMYCIN 250 MG PO TABS
500.0000 mg | ORAL_TABLET | Freq: Once | ORAL | Status: AC
  Administered 2024-05-26: 500 mg via ORAL
  Filled 2024-05-26: qty 2

## 2024-05-26 NOTE — ED Provider Notes (Signed)
  Physical Exam  BP (!) 145/82 (BP Location: Right Arm)   Pulse 72   Temp 98.7 F (37.1 C) (Oral)   Resp 16   Ht 5' 6 (1.676 m)   Wt 49.9 kg   SpO2 100%   BMI 17.75 kg/m   Physical Exam Vitals and nursing note reviewed.  Constitutional:      Appearance: She is well-developed.  HENT:     Head: Normocephalic and atraumatic.   Neurological:     Mental Status: She is alert.     Procedures  Procedures  ED Course / MDM    Medical Decision Making Amount and/or Complexity of Data Reviewed Labs: ordered. Radiology: ordered.  Risk Prescription drug management.   Care assumed from Dr. Freddi at shift change.  Patient awaiting results of CT scan to further evaluate for abdominal pain she began experiencing while at her rehab facility.  Study has returned and shows no acute intra-abdominal process, but does show evidence for developing infiltrate in the right lower lobe.  This to be treated with Zithromax .  Also on the CT scan was mention of possible L2-L3 discitis/osteomyelitis, however this finding has been present for several years and is stable/unchanged.  No further action needed on this.  Also, potassium is 5.2 after she missed dialysis on Friday.  She is due to be dialyzed again on Monday.  I discussed the situation with Dr. Gearline from nephrology.  She feels as though patient will be fine to wait until Monday for her dialysis.  She has recommended patient take 10 g of Lokelma  on Sunday and this medication will be prescribed.    Geroldine Berg, MD 05/26/24 [phone]

## 2024-05-26 NOTE — Discharge Instructions (Addendum)
 Begin taking Zithromax  as prescribed.  Take the Lokelma  on Sunday as prescribed.  Follow-up with your dialysis session on Monday as scheduled, and return to the ER if symptoms significantly worsen or change.

## 2024-08-07 ENCOUNTER — Other Ambulatory Visit: Payer: Self-pay

## 2024-08-07 ENCOUNTER — Encounter (HOSPITAL_COMMUNITY)
Admission: RE | Disposition: A | Discharge status: Home / Self Care | Payer: Self-pay | Source: Home / Self Care | Attending: Vascular Surgery

## 2024-08-07 ENCOUNTER — Ambulatory Visit (HOSPITAL_COMMUNITY)
Admission: RE | Admit: 2024-08-07 | Discharge: 2024-08-07 | Disposition: A | Discharge status: Home / Self Care | Payer: Medicare (Managed Care) | Attending: Vascular Surgery | Admitting: Vascular Surgery

## 2024-08-07 DIAGNOSIS — I120 Hypertensive chronic kidney disease with stage 5 chronic kidney disease or end stage renal disease: Secondary | ICD-10-CM | POA: Insufficient documentation

## 2024-08-07 DIAGNOSIS — N186 End stage renal disease: Secondary | ICD-10-CM | POA: Diagnosis not present

## 2024-08-07 DIAGNOSIS — Y832 Surgical operation with anastomosis, bypass or graft as the cause of abnormal reaction of the patient, or of later complication, without mention of misadventure at the time of the procedure: Secondary | ICD-10-CM | POA: Insufficient documentation

## 2024-08-07 DIAGNOSIS — T82898A Other specified complication of vascular prosthetic devices, implants and grafts, initial encounter: Secondary | ICD-10-CM

## 2024-08-07 DIAGNOSIS — T82858A Stenosis of vascular prosthetic devices, implants and grafts, initial encounter: Secondary | ICD-10-CM | POA: Insufficient documentation

## 2024-08-07 DIAGNOSIS — Z992 Dependence on renal dialysis: Secondary | ICD-10-CM | POA: Diagnosis not present

## 2024-08-07 HISTORY — PX: VENOUS ANGIOPLASTY: CATH118376

## 2024-08-07 HISTORY — PX: A/V SHUNT INTERVENTION: CATH118220

## 2024-08-07 SURGERY — A/V SHUNT INTERVENTION
Anesthesia: LOCAL | Site: Arm Upper | Laterality: Left

## 2024-08-07 MED ORDER — IODIXANOL 320 MG/ML IV SOLN
INTRAVENOUS | Status: DC | PRN
  Administered 2024-08-07: 35 mL via INTRAVENOUS

## 2024-08-07 MED ORDER — LIDOCAINE HCL (PF) 1 % IJ SOLN
INTRAMUSCULAR | Status: AC
  Filled 2024-08-07: qty 30

## 2024-08-07 MED ORDER — HEPARIN (PORCINE) IN NACL 1000-0.9 UT/500ML-% IV SOLN
INTRAVENOUS | Status: DC | PRN
  Administered 2024-08-07: 500 mL

## 2024-08-07 MED ORDER — LIDOCAINE HCL (PF) 1 % IJ SOLN
INTRAMUSCULAR | Status: DC | PRN
  Administered 2024-08-07: 5 mL

## 2024-08-07 SURGICAL SUPPLY — 8 items
BALLOON MUSTANG 7.0X40 75 (BALLOONS) IMPLANT
KIT ENCORE 26 ADVANTAGE (KITS) IMPLANT
KIT MICROPUNCTURE NIT STIFF (SHEATH) IMPLANT
SHEATH PINNACLE R/O II 6F 4CM (SHEATH) IMPLANT
SHEATH PROBE COVER 6X72 (BAG) IMPLANT
TRAY PV CATH (CUSTOM PROCEDURE TRAY) ×2 IMPLANT
TUBING CIL FLEX 10 FLL-RA (TUBING) IMPLANT
WIRE BENTSON .035X145CM (WIRE) IMPLANT

## 2024-08-07 NOTE — Op Note (Addendum)
    Patient name: Diamond [name] MRN: [id number] DOB: Apr 09, 1951 Sex: female  08/07/2024 Pre-operative Diagnosis: Left arm AV fistula malfunction Post-operative diagnosis:  Same Surgeon:  Fonda FORBES Rim, MD Procedure Performed: 1.  Left arm fistulogram 2.  Mid-fistula venoplasty 7 x 40 mm 3.  Contrast volume 35ml 4.  Access managed with Monocryl suture    Indications: Patient is a 73 year old female with history of left arm brachiobasilic fistula.  She was noted to have low flows at dialysis, and presents for fistulogram in an effort to define, improve flow if possible for continued fistula use.  After discussing risk and benefits, and he elected to proceed.  Findings:  No anastomotic stenosis There is an aneurysmal segment of fistula that then tapers down to normal vein.  Roughly 60% change in size.   Otherwise no stenosis No central stenosis.    Procedure:  The patient was identified in the holding area and taken to room 8.  The patient was then placed supine on the table and prepped and draped in the usual sterile fashion.  A time out was called.  The left arm brachiobasilic fistula was accessed using a micropuncture needle.  This was exchanged to a micropuncture sheath using Seldinger technique.  Fistulogram followed.  I elected to treat this as stenosis as there was pulsatility within the fistula with low flows documented at dialysis.  A 7 x 40 mm balloon was brought into the field and expanded across the lesion.  This was held in place for 2 minutes with follow-up angiography demonstrating significant improvement, while there is still caliber change due to the aneurysm, there was improved flow with less pulsatility.    Fistula remains amenable to future intervention if necessary. Fistula can continue to be accessed.     Fonda FORBES Rim MD [facility] Office: [phone]

## 2024-08-07 NOTE — H&P (Signed)
 Office Note     CC: Fistula malfunction Requesting Provider:  No ref. provider found  HPI: Diamond [name] is a 73 y.o. ([birth year]) female presenting at the request of .Reed, Tiffany L, DO for fistula malfunction.  On exam, Diamond [name] was doing well.  She currently dialyzes Tuesday Thursday Saturday from left arm brachiobasilic fistula. Interestingly, and he notes no issues at dialysis no prolonged bleeding, no poor flows, however her paperwork stated otherwise.  Denies history of steal syndrome.    Past Medical History:  Diagnosis Date   Anemia    Asthma    Atrial flutter with rapid ventricular response (HCC) 12/25/2021   Bacteremia    ESRD (end stage renal disease) (HCC)    dialysis Tues Thurs Sat   Gout    Hepatitis C    Hypertension    PAF (paroxysmal atrial fibrillation) (HCC)    RBBB     Past Surgical History:  Procedure Laterality Date   AV FISTULA PLACEMENT Left 12/23/2021   Procedure: LEFT ARM BRACHIOBASILIC VEIN ARTERIOVENOUS (AV) FISTULA CREATION;  Surgeon: Magda Debby SAILOR, MD;  Location: [facility] OR;  Service: Vascular;  Laterality: Left;   BASCILIC VEIN TRANSPOSITION Left 03/08/2022   Procedure: LEFT SECOND STAGE BASILIC VEIN TRANSPOSITION;  Surgeon: Magda Debby SAILOR, MD;  Location: [facility] OR;  Service: Vascular;  Laterality: Left;   BIOPSY  01/19/2022   Procedure: BIOPSY;  Surgeon: San Sandor GAILS, DO;  Location: [facility] ENDOSCOPY;  Service: Gastroenterology;;   CESAREAN SECTION  1989   CHOLECYSTECTOMY N/A 06/25/2022   Procedure: OPEN CHOLECYSTECTOMY;  Surgeon: Dasie Leonor CROME, MD;  Location: [facility] OR;  Service: General;  Laterality: N/A;   COLONOSCOPY Left 01/19/2022   Procedure: COLONOSCOPY;  Surgeon: San Sandor GAILS, DO;  Location: [facility] ENDOSCOPY;  Service: Gastroenterology;  Laterality: Left;   ESOPHAGOGASTRODUODENOSCOPY Left 01/19/2022   Procedure: ESOPHAGOGASTRODUODENOSCOPY (EGD);  Surgeon: San Sandor GAILS, DO;  Location: [facility] ENDOSCOPY;  Service: Gastroenterology;   Laterality: Left;   INSERTION OF DIALYSIS CATHETER Right 12/23/2021   Procedure: INSERTION OF DIALYSIS CATHETER;  Surgeon: Magda Debby SAILOR, MD;  Location: [facility] OR;  Service: Vascular;  Laterality: Right;   LAPAROSCOPY N/A 06/25/2022   Procedure: STAGING LAPAROSCOPY;  Surgeon: Dasie Leonor CROME, MD;  Location: [facility] OR;  Service: General;  Laterality: N/A;   OPEN PARTIAL HEPATECTOMY  N/A 06/25/2022   Procedure: OPEN PARTIAL HEPATECTOMY;  Surgeon: Dasie Leonor CROME, MD;  Location: [facility] OR;  Service: General;  Laterality: N/A;   POLYPECTOMY  01/19/2022   Procedure: POLYPECTOMY;  Surgeon: San Sandor GAILS, DO;  Location: [facility] ENDOSCOPY;  Service: Gastroenterology;;   TEE WITHOUT CARDIOVERSION N/A 12/22/2021   Procedure: TRANSESOPHAGEAL ECHOCARDIOGRAM (TEE);  Surgeon: Jeffrie Oneil BROCKS, MD;  Location: [facility] ENDOSCOPY;  Service: Cardiovascular;  Laterality: N/A;   TEE WITHOUT CARDIOVERSION N/A 06/17/2022   Procedure: TRANSESOPHAGEAL ECHOCARDIOGRAM (TEE);  Surgeon: Lonni Slain, MD;  Location[facility] ENDOSCOPY;  Service: Cardiovascular;  Laterality: N/A;   ULTRASOUND GUIDANCE FOR VASCULAR ACCESS  12/23/2021   Procedure: ULTRASOUND GUIDANCE FOR VASCULAR ACCESS;  Surgeon: Magda Debby SAILOR, MD;  Location: [facility] OR;  Service: Vascular;;    Social History   Socioeconomic History   Marital status: Widowed    Spouse name: Not on file   Number of children: Not on file   Years of education: Not on file   Highest education level: Not on file  Occupational History   Not on file  Tobacco Use   Smoking status: Never    Passive exposure: Never  Smokeless tobacco: Never  Vaping Use   Vaping status: Never Used  Substance and Sexual Activity   Alcohol use: No   Drug use: No   Sexual activity: Not Currently    Birth control/protection: Post-menopausal  Other Topics Concern   Not on file  Social History Narrative   Not on file   Social Drivers of Health   Financial Resource Strain: Not on file  Food Insecurity: No  Food Insecurity (09/03/2023)   Hunger Vital Sign    Worried About Running Out of Food in the Last Year: Never true    Ran Out of Food in the Last Year: Never true  Transportation Needs: No Transportation Needs (09/03/2023)   PRAPARE - Administrator, Civil Service (Medical): No    Lack of Transportation (Non-Medical): No  Physical Activity: Not on file  Stress: Not on file  Social Connections: Moderately Integrated (07/20/2022)   Social Connection and Isolation Panel    Frequency of Communication with Friends and Family: Three times a week    Frequency of Social Gatherings with Friends and Family: Three times a week    Attends Religious Services: 1 to 4 times per year    Active Member of Clubs or Organizations: Yes    Attends Banker Meetings: 1 to 4 times per year    Marital Status: Widowed  Intimate Partner Violence: Not At Risk (09/03/2023)   Humiliation, Afraid, Rape, and Kick questionnaire    Fear of Current or Ex-Partner: No    Emotionally Abused: No    Physically Abused: No    Sexually Abused: No   Family History  Problem Relation Age of Onset   Hypertension Mother    Hypertension Father    Colon cancer Brother    Lung cancer Brother     No current facility-administered medications for this encounter.    Allergies  Allergen Reactions   Shellfish-Derived Products Other (See Comments)   Shrimp (Diagnostic) Other (See Comments)    Unknown reaction   Other Swelling and Rash    Anesthesia - facial swelling, rash     REVIEW OF SYSTEMS:  [X]  denotes positive finding, [ ]  denotes negative finding Cardiac  Comments:  Chest pain or chest pressure:    Shortness of breath upon exertion:    Short of breath when lying flat:    Irregular heart rhythm:        Vascular    Pain in calf, thigh, or hip brought on by ambulation:    Pain in feet at night that wakes you up from your sleep:     Blood clot in your veins:    Leg swelling:         Pulmonary     Oxygen at home:    Productive cough:     Wheezing:         Neurologic    Sudden weakness in arms or legs:     Sudden numbness in arms or legs:     Sudden onset of difficulty speaking or slurred speech:    Temporary loss of vision in one eye:     Problems with dizziness:         Gastrointestinal    Blood in stool:     Vomited blood:         Genitourinary    Burning when urinating:     Blood in urine:        Psychiatric    Major depression:  Hematologic    Bleeding problems:    Problems with blood clotting too easily:        Skin    Rashes or ulcers:        Constitutional    Fever or chills:      PHYSICAL EXAMINATION:  There were no vitals filed for this visit.  General:  WDWN in NAD; vital signs documented above Gait: Not observed HENT: WNL, normocephalic Pulmonary: normal non-labored breathing , without wheezing Cardiac: regular HR Abdomen: soft, NT, no masses Skin: without rashes Vascular Exam/Pulses:  Right Left  Radial 2+ (normal) 1+                      Excellent thrill in left arm brachiobasilic fistula No significant pulsatility Extremities: without ischemic changes, without Gangrene , without cellulitis; without open wounds;  Musculoskeletal: no muscle wasting or atrophy  Neurologic: A&O X 3;  No focal weakness or paresthesias are detected Psychiatric:  The pt has Normal affect.   Non-Invasive Vascular Imaging:   -    ASSESSMENT/PLAN: RAKAYLA RICKLEFS is a 73 y.o. female presenting with fistula function, low flows during dialysis.  After discussing the risk and benefits of left arm fistulogram in an effort to define, and hopefully improve flow for continued fistula use, Diamond [name] elected to proceed.    Fonda FORBES Rim, MD [facility] [phone]

## 2024-08-08 ENCOUNTER — Encounter (HOSPITAL_COMMUNITY): Payer: Self-pay | Admitting: Vascular Surgery

## 2024-11-20 ENCOUNTER — Ambulatory Visit: Payer: Medicare (Managed Care) | Admitting: Internal Medicine

## 2024-12-27 NOTE — Progress Notes (Unsigned)
 "   Cardiology Clinic Note   Patient Name: Diamond [name] Date of Encounter: 12/27/2024  Primary Care Provider:  Cloria Annabella CROME, DO Primary Cardiologist:  Diamond [name] Sorrow, MD (Inactive)  Patient Profile    Diamond [name] 74 year old female presents the clinic today for follow-up evaluation of her paroxysmal atrial flutter/fibrillation, and coronary artery disease.  Past Medical History    Past Medical History:  Diagnosis Date   Anemia    Asthma    Atrial flutter with rapid ventricular response (HCC) 12/25/2021   Bacteremia    ESRD (end stage renal disease) (HCC)    dialysis Tues Thurs Sat   Gout    Hepatitis C    Hypertension    PAF (paroxysmal atrial fibrillation) (HCC)    RBBB    Past Surgical History:  Procedure Laterality Date   A/V SHUNT INTERVENTION Left 08/07/2024   Procedure: A/V SHUNT INTERVENTION;  Surgeon: Diamond Fonda FORBES, MD;  Location: [facility];  Service: Cardiovascular;  Laterality: Left;   AV FISTULA PLACEMENT Left 12/23/2021   Procedure: LEFT ARM BRACHIOBASILIC VEIN ARTERIOVENOUS (AV) FISTULA CREATION;  Surgeon: Diamond Debby SAILOR, MD;  Location: [facility] OR;  Service: Vascular;  Laterality: Left;   BASCILIC VEIN TRANSPOSITION Left 03/08/2022   Procedure: LEFT SECOND STAGE BASILIC VEIN TRANSPOSITION;  Surgeon: Diamond Debby SAILOR, MD;  Location: [facility] OR;  Service: Vascular;  Laterality: Left;   BIOPSY  01/19/2022   Procedure: BIOPSY;  Surgeon: Diamond Sandor GAILS, DO;  Location: [facility];  Service: Gastroenterology;;   CESAREAN SECTION  1989   CHOLECYSTECTOMY N/A 06/25/2022   Procedure: OPEN CHOLECYSTECTOMY;  Surgeon: Diamond Leonor CROME, MD;  Location: [facility] OR;  Service: General;  Laterality: N/A;   COLONOSCOPY Left 01/19/2022   Procedure: COLONOSCOPY;  Surgeon: Diamond Sandor GAILS, DO;  Location: [facility];  Service: Gastroenterology;  Laterality: Left;   ESOPHAGOGASTRODUODENOSCOPY Left 01/19/2022   Procedure: ESOPHAGOGASTRODUODENOSCOPY (EGD);  Surgeon: Diamond Sandor GAILS, DO;  Location: [facility] ENDOSCOPY;  Service: Gastroenterology;  Laterality: Left;   INSERTION OF DIALYSIS CATHETER Right 12/23/2021   Procedure: INSERTION OF DIALYSIS CATHETER;  Surgeon: Diamond Debby SAILOR, MD;  Location: [facility] OR;  Service: Vascular;  Laterality: Right;   LAPAROSCOPY N/A 06/25/2022   Procedure: STAGING LAPAROSCOPY;  Surgeon: Diamond Leonor CROME, MD;  Location: [facility] OR;  Service: General;  Laterality: N/A;   OPEN PARTIAL HEPATECTOMY  N/A 06/25/2022   Procedure: OPEN PARTIAL HEPATECTOMY;  Surgeon: Diamond Leonor CROME, MD;  Location: [facility] OR;  Service: General;  Laterality: N/A;   POLYPECTOMY  01/19/2022   Procedure: POLYPECTOMY;  Surgeon: Diamond Sandor GAILS, DO;  Location: [facility]OS[facility];  Service: Gastroenterology;;   TEE WITHOUT CARDIOVERSION N/A 12/22/2021   Procedure: TRANSESOPHAGEAL ECHOCARDIOGRAM (TEE);  Surgeon: Diamond Oneil BROCKS, MD;  Location: [facility] ENDOSCOPY;  Service: Cardiovascular;  Laterality: N/A;   TEE WITHOUT CARDIOVERSION N/A 06/17/2022   Procedure: TRANSESOPHAGEAL ECHOCARDIOGRAM (TEE);  Surgeon: Diamond Slain, MD;  Location: [facility] ENDOSCOPY;  Service: Cardiovascular;  Laterality: N/A;   ULTRASOUND GUIDANCE FOR VASCULAR ACCESS  12/23/2021   Procedure: ULTRASOUND GUIDANCE FOR VASCULAR ACCESS;  Surgeon: Diamond Debby SAILOR, MD;  Location: [facility] OR;  Service: Vascular;;   VENOUS ANGIOPLASTY  08/07/2024   Procedure: VENOUS ANGIOPLASTY;  Surgeon: Diamond Fonda FORBES, MD;  Location: [facility];  Service: Cardiovascular;;    Allergies  Allergies[1]  History of Present Illness    Diamond [name] has a PMH of end-stage renal disease on HD, MRSA bacteremia with recurrent episodes of  bacteremia, paroxysmal atrial fibrillation/flutter, hypertension, coronary/aortic calcifications seen on CT, 4 cm ascending TAA by CT 12/24, anemia, asthma, hypothyroidism, gout, chronic hepatitis C, lung nodules, gallbladder mass status postcholecystectomy, and RBBB.  She was initially seen and evaluated by Dr. Wonda  1/23 during admission for sepsis with MRSA bacteremia, and a right atrial/IVC mass.  She had TEE which showed 2.1 x 1.4 cm multi lobulated mobile mass attached to the RA/IVC junction which was felt to be likely a thrombus versus vegetation.  She was managed with antibiotics and apixaban .  During her admission she developed atrial flutter.  Her apixaban  had been temporarily held due to bleeding at the IJ site with hypotension and she did require transfusion.  She had subsequent TEE 7/23 and her echoes have not showed recurrent endocarditis.  During admission 10/23 she was trialed on IV amiodarone  for atrial fibrillation and had conversion to normal sinus rhythm but there was concern for QT prolongation.  She was again admitted 2/24 with osteomyelitis and MRSA bacteremia this was complicated by atrial fibrillation with RVR.  She was treated with amiodarone  and converted back to sinus rhythm.  Her echocardiogram 2/24 showed an EF of 60 to 65%, G2 DD, mildly elevated PASP and no valvular vegetations.  She was admitted 9/27 through 09/08/2023 for pancreatitis.  She was noted to have worsening anemia without evidence of bleed.  She required transfusion.  She underwent chest CT 12/24 which showed cardiomegaly, coronary/aortic calcifications, 4 cm mid ascending TAA, and dilation of the left subclavian vein and left upper extremity veins due to AV fistula.  Her Eliquis  was discontinued by medical team due to anemia.  There was concern for GI bleed.  She was seen by ID [id number].  She was noted to be off Bactrim  without clear reason.  She remained stable.  It was recommended that she remain off and follow-up as needed going forward.  She was seen in follow-up by Diamond Bring, PA-C on 12/27/2023.  During that time she presented alone.  [facility].  She did not know the medications she was taking.  She was living at [place].  Cardiology clinic contacted office to review.  She  reported that she was feeling great.  She denied chest pain, shortness of breath, palpitations, bleeding, and issues with blood pressure at hemodialysis.  She presents to the clinic today for follow-up evaluation.  She states***.  *** denies chest pain, shortness of breath, lower extremity edema, fatigue, palpitations, melena, hematuria, hemoptysis, diaphoresis, weakness, presyncope, syncope, orthopnea, and PND.  Paroxysmal atrial fibrillation-EKG today shows***.  Denies lightheadedness, presyncope or syncope.  No recent palpitations, accelerated or irregular heartbeats.  Eliquis  discontinued 10/24 by medical team due to recurrent anemia requiring transfusion with concern for GI bleeding.  Not a candidate for Watchman procedure.  Has had recent CT and had CXR on 05/25/2024 Continue amiodarone , metoprolol  Recommended yearly eye exam, Order TSH Order amiodarone  level Essential hypertension-BP today*** Maintain blood pressure log  History of endocarditis-no increased DOE or activity intolerance.  Had TEE previously which showed 2.1 x 1.4 cm lobulated mobile mass attached to RA/IVC.  She was managed with antibiotics and apixaban .  There was question of endocarditis versus thrombus at prior HD catheter site.  Not a candidate for anticoagulation due to recurrent anemia Continue to monitor  Aortic atherosclerosis, ascending aortic aneurysm-denies chest and back discomfort.  Echocardiogram 01/05/2024 showed normal LVEF and ascending aortic aneurysm measuring 39 mm.  CT  of abdomen and pelvis 05/25/2024 showed 6 mm nodule in right middle lobe unchanged from 2023, aortic atherosclerosis. Maintain good blood pressure control Repeat echocardiogram  Disposition: Follow-up with Dr. Floretta, Dayna Dunn, or me in 9-12 months.  Home Medications    Prior to Admission medications  Medication Sig Start Date End Date Taking? Authorizing Provider  acetaminophen  (TYLENOL ) 500 MG tablet Take 1,000 mg by mouth every  8 (eight) hours as needed (for back and leg pain). Take 2 tablets (1000mg  total) by mouth every 8 hours as needed for back and leg pain.    [provider]  amiodarone  (PACERONE ) 200 MG tablet Take 1 tablet (200 mg total) by mouth daily. 01/27/23   Raenelle Coria, MD  azithromycin  (ZITHROMAX ) 250 MG tablet Take 1 tablet (250 mg total) by mouth daily. 05/26/24   Geroldine Berg, MD  Darbepoetin Alfa  (ARANESP ) 200 MCG/0.4ML SOSY injection Inject 0.4 mLs (200 mcg total) into the skin every Monday at 6 PM. 09/12/23   Dameron, Barabara, DO  gabapentin  (NEURONTIN ) 300 MG capsule Take 300 mg by mouth 3 (three) times a week. Take on Mondays, Wednesdays and Fridays after dialysis.    [provider]  HYDROcodone -acetaminophen  (NORCO) 7.5-325 MG tablet Take 1 tablet by mouth in the morning, at noon, and at bedtime.    [provider]  lidocaine  4 % Place 1 patch onto the skin daily. Apply for 12 hours to lower back and leave off for 12 hours between patches.    [provider]  metoprolol  tartrate (LOPRESSOR ) 50 MG tablet Take 1 tablet (50 mg total) by mouth 2 (two) times daily. 01/26/23   Raenelle Coria, MD  simethicone  (MYLICON) 80 MG chewable tablet Chew 80 mg by mouth as needed for flatulence.    [provider]  sodium zirconium cyclosilicate  (LOKELMA ) 10 g PACK packet Take the 10 g Lokelma  pack on Sunday, June 22 to help lower your potassium. 05/26/24   Geroldine Berg, MD  Vitamin D, Ergocalciferol, (DRISDOL) 1.25 MG (50000 UNIT) CAPS capsule Take 50,000 Units by mouth daily.    [provider]    Family History    Family History  Problem Relation Age of Onset   Hypertension Mother    Hypertension Father    Colon cancer Brother    Lung cancer Brother    She indicated that her mother is deceased. She indicated that her father is deceased.  Social History    Social History   Socioeconomic History   Marital status: Widowed    Spouse name: Not on file    Number of children: Not on file   Years of education: Not on file   Highest education level: Not on file  Occupational History   Not on file  Tobacco Use   Smoking status: Never    Passive exposure: Never   Smokeless tobacco: Never  Vaping Use   Vaping status: Never Used  Substance and Sexual Activity   Alcohol use: No   Drug use: No   Sexual activity: Not Currently    Birth control/protection: Post-menopausal  Other Topics Concern   Not on file  Social History Narrative   Not on file   Social Drivers of Health   Tobacco Use: Low Risk (05/31/2024)   Received from [facility]   Patient History    Smoking Tobacco Use: Never    Smokeless Tobacco Use: Never    Passive Exposure: Not on file  Financial Resource Strain: Not on file  Food  Insecurity: No Food Insecurity (09/03/2023)   Hunger Vital Sign    Worried About Running Out of Food in the Last Year: Never true    Ran Out of Food in the Last Year: Never true  Transportation Needs: No Transportation Needs (09/03/2023)   PRAPARE - Administrator, Civil Service (Medical): No    Lack of Transportation (Non-Medical): No  Physical Activity: Not on file  Stress: Not on file  Social Connections: Moderately Integrated (07/20/2022)   Social Connection and Isolation Panel    Frequency of Communication with Friends and Family: Three times a week    Frequency of Social Gatherings with Friends and Family: Three times a week    Attends Religious Services: 1 to 4 times per year    Active Member of Clubs or Organizations: Yes    Attends Banker Meetings: 1 to 4 times per year    Marital Status: Widowed  Intimate Partner Violence: Not At Risk (09/03/2023)   Humiliation, Afraid, Rape, and Kick questionnaire    Fear of Current or Ex-Partner: No    Emotionally Abused: No    Physically Abused: No    Sexually Abused: No  Depression (PHQ2-9): Low Risk (12/20/2023)   Depression (PHQ2-9)    PHQ-2 Score: 0  Alcohol  Screen: Not on file  Housing: Low Risk (09/03/2023)   Housing    Last Housing Risk Score: 0  Utilities: Not At Risk (09/03/2023)   AHC Utilities    Threatened with loss of utilities: No  Health Literacy: Not on file     Review of Systems    General:  No chills, fever, night sweats or weight changes.  Cardiovascular:  No chest pain, dyspnea on exertion, edema, orthopnea, palpitations, paroxysmal nocturnal dyspnea. Dermatological: No rash, lesions/masses Respiratory: No cough, dyspnea Urologic: No hematuria, dysuria Abdominal:   No nausea, vomiting, diarrhea, bright red blood per rectum, melena, or hematemesis Neurologic:  No visual changes, wkns, changes in mental status. All other systems reviewed and are otherwise negative except as noted above.  Physical Exam    VS:  There were no vitals taken for this visit. , BMI There is no height or weight on file to calculate BMI. GEN: Well nourished, well developed, in no acute distress. HEENT: normal. Neck: Supple, no JVD, carotid bruits, or masses. Cardiac: RRR, no murmurs, rubs, or gallops. No clubbing, cyanosis, edema.  Radials/DP/PT 2+ and equal bilaterally.  Respiratory:  Respirations regular and unlabored, clear to auscultation bilaterally. GI: Soft, nontender, nondistended, BS + x 4. MS: no deformity or atrophy. Skin: warm and dry, no rash. Neuro:  Strength and sensation are intact. Psych: Normal affect.  Accessory Clinical Findings    Recent Labs: 05/25/2024: ALT 8; BUN 48; Creatinine, Ser 8.60; Hemoglobin 15.0; Platelets 108; Potassium 5.2; Sodium 134   Recent Lipid Panel    Component Value Date/Time   CHOL 188 02/23/2011 0431   TRIG 122 02/23/2011 0431   HDL 84 02/23/2011 0431   CHOLHDL 2.2 Ratio 02/23/2011 0431   VLDL 24 02/23/2011 0431   LDLCALC 80 02/23/2011 0431    No BP recorded.  {Refresh Note OR Click here to enter BP  :1}***    ECG personally reviewed by me today- ***     Echocardiogram  01/05/2024  IMPRESSIONS     1. Left ventricular ejection fraction, by estimation, is 60 to 65%. The  left ventricle has normal function. The left ventricle has no regional  wall motion abnormalities. There is  mild concentric left ventricular  hypertrophy. Left ventricular diastolic  parameters are consistent with Grade II diastolic dysfunction  (pseudonormalization). The average left ventricular global longitudinal  strain is -22.6 %. The global longitudinal strain is normal.   2. Small mobile echodensity at the RVOT that is seen on prior echos. .  Right ventricular systolic function is normal. The right ventricular size  is normal.   3. Left atrial size was moderately dilated.   4. The mitral valve is normal in structure. No evidence of mitral valve  regurgitation. No evidence of mitral stenosis.   5. The aortic valve is normal in structure. There is mild calcification  of the aortic valve. There is mild thickening of the aortic valve. Aortic  valve regurgitation is not visualized. No aortic stenosis is present.   6. Aortic dilatation noted. There is mild dilatation of the ascending  aorta, measuring 39 mm.   7. The inferior vena cava is normal in size with greater than 50%  respiratory variability, suggesting right atrial pressure of 3 mmHg.   FINDINGS   Left Ventricle: Left ventricular ejection fraction, by estimation, is 60  to 65%. The left ventricle has normal function. The left ventricle has no  regional wall motion abnormalities. The average left ventricular global  longitudinal strain is -22.6 %.  The global longitudinal strain is normal. The left ventricular internal  cavity size was normal in size. There is mild concentric left ventricular  hypertrophy. Left ventricular diastolic parameters are consistent with  Grade II diastolic dysfunction  (pseudonormalization).   Right Ventricle: Small mobile echodensity at the RVOT that is seen on  prior echos. The right  ventricular size is normal. No increase in right  ventricular wall thickness. Right ventricular systolic function is normal.   Left [facility]: Left atrial size was moderately dilated.   Right [facility]: Right atrial size was normal in size.   Pericardium: There is no evidence of pericardial effusion.   Mitral Valve: The mitral valve is normal in structure. No evidence of  mitral valve regurgitation. No evidence of mitral valve stenosis.   Tricuspid Valve: The tricuspid valve is normal in structure. Tricuspid  valve regurgitation is trivial. No evidence of tricuspid stenosis.   The aortic valve is normal in structure. There is mild calcification of  the aortic valve. There is mild thickening of the aortic valve. Aortic  valve regurgitation is not visualized. No aortic stenosis is present.  Pulmonic Valve: The pulmonic valve was normal in structure. Pulmonic valve  regurgitation is not visualized. No evidence of pulmonic stenosis.   Aorta: The aortic root is normal in size and structure and aortic  dilatation noted. There is mild dilatation of the ascending aorta,  measuring 39 mm.   Venous: The inferior vena cava is normal in size with greater than 50%  respiratory variability, suggesting right atrial pressure of 3 mmHg.   IAS/Shunts: No atrial level shunt detected by color flow Doppler.      Assessment & Plan   1.  ***   Josefa HERO. Climmie Cronce NP-C     12/27/2024, 8:43 AM [facility] [address] [place], [place] [address] Office [phone]    Notice: This dictation was prepared with Dragon dictation along with smaller phrase technology. Any transcriptional errors that result from this process are unintentional and may not be corrected upon review.   I spent***minutes examining this patient, reviewing medications, and using patient centered shared decision making involving their cardiac  care.   I spent  20 minutes reviewing past medical  history,  medications, and prior cardiac tests.     [1]  Allergies Allergen Reactions   Shellfish Protein-Containing Drug Products Other (See Comments)   Shrimp (Diagnostic) Other (See Comments)    Unknown reaction   Other Swelling and Rash    Anesthesia - facial swelling, rash   "

## 2024-12-31 ENCOUNTER — Ambulatory Visit: Payer: Medicare (Managed Care) | Admitting: General Practice

## 2025-03-12 ENCOUNTER — Ambulatory Visit: Payer: Medicare (Managed Care) | Admitting: Internal Medicine
